# Patient Record
Sex: Male | Born: 1955 | Race: Asian | Hispanic: No | Marital: Married | Smoking: Former smoker
Health system: Southern US, Community
[De-identification: ages and names within clinical notes are randomized; demographics above are authoritative.]

## PROBLEM LIST (undated history)

## (undated) DIAGNOSIS — J189 Pneumonia, unspecified organism: Secondary | ICD-10-CM

## (undated) DIAGNOSIS — I739 Peripheral vascular disease, unspecified: Secondary | ICD-10-CM

## (undated) DIAGNOSIS — N186 End stage renal disease: Secondary | ICD-10-CM

## (undated) DIAGNOSIS — J9601 Acute respiratory failure with hypoxia: Secondary | ICD-10-CM

## (undated) DIAGNOSIS — K219 Gastro-esophageal reflux disease without esophagitis: Secondary | ICD-10-CM

## (undated) DIAGNOSIS — M216X1 Other acquired deformities of right foot: Secondary | ICD-10-CM

## (undated) DIAGNOSIS — F32A Depression, unspecified: Secondary | ICD-10-CM

## (undated) DIAGNOSIS — Z992 Dependence on renal dialysis: Secondary | ICD-10-CM

## (undated) DIAGNOSIS — E11621 Type 2 diabetes mellitus with foot ulcer: Secondary | ICD-10-CM

## (undated) DIAGNOSIS — I480 Paroxysmal atrial fibrillation: Secondary | ICD-10-CM

## (undated) DIAGNOSIS — M1990 Unspecified osteoarthritis, unspecified site: Secondary | ICD-10-CM

## (undated) DIAGNOSIS — I2510 Atherosclerotic heart disease of native coronary artery without angina pectoris: Secondary | ICD-10-CM

## (undated) DIAGNOSIS — J90 Pleural effusion, not elsewhere classified: Secondary | ICD-10-CM

## (undated) DIAGNOSIS — M216X2 Other acquired deformities of left foot: Secondary | ICD-10-CM

## (undated) DIAGNOSIS — M21969 Unspecified acquired deformity of unspecified lower leg: Secondary | ICD-10-CM

## (undated) DIAGNOSIS — J810 Acute pulmonary edema: Secondary | ICD-10-CM

## (undated) DIAGNOSIS — E1151 Type 2 diabetes mellitus with diabetic peripheral angiopathy without gangrene: Secondary | ICD-10-CM

## (undated) DIAGNOSIS — I509 Heart failure, unspecified: Secondary | ICD-10-CM

## (undated) DIAGNOSIS — L570 Actinic keratosis: Secondary | ICD-10-CM

## (undated) DIAGNOSIS — I214 Non-ST elevation (NSTEMI) myocardial infarction: Secondary | ICD-10-CM

## (undated) DIAGNOSIS — I119 Hypertensive heart disease without heart failure: Secondary | ICD-10-CM

## (undated) DIAGNOSIS — R0600 Dyspnea, unspecified: Secondary | ICD-10-CM

## (undated) DIAGNOSIS — I10 Essential (primary) hypertension: Secondary | ICD-10-CM

## (undated) DIAGNOSIS — E785 Hyperlipidemia, unspecified: Secondary | ICD-10-CM

## (undated) DIAGNOSIS — L97509 Non-pressure chronic ulcer of other part of unspecified foot with unspecified severity: Secondary | ICD-10-CM

## (undated) DIAGNOSIS — F329 Major depressive disorder, single episode, unspecified: Secondary | ICD-10-CM

## (undated) HISTORY — DX: Non-ST elevation (NSTEMI) myocardial infarction: I21.4

## (undated) HISTORY — DX: Other acquired deformities of right foot: M21.6X1

## (undated) HISTORY — DX: Pneumonia, unspecified organism: J18.9

## (undated) HISTORY — DX: Peripheral vascular disease, unspecified: I73.9

## (undated) HISTORY — DX: Type 2 diabetes mellitus with diabetic peripheral angiopathy without gangrene: E11.51

## (undated) HISTORY — DX: Essential (primary) hypertension: I10

## (undated) HISTORY — DX: Acute respiratory failure with hypoxia: J96.01

## (undated) HISTORY — DX: Acute pulmonary edema: J81.0

## (undated) HISTORY — DX: Heart failure, unspecified: I50.9

## (undated) HISTORY — DX: Depression, unspecified: F32.A

## (undated) HISTORY — DX: Actinic keratosis: L57.0

## (undated) HISTORY — DX: Hyperlipidemia, unspecified: E78.5

## (undated) HISTORY — DX: Unspecified acquired deformity of unspecified lower leg: M21.969

## (undated) HISTORY — DX: Other acquired deformities of left foot: M21.6X2

## (undated) HISTORY — DX: Pleural effusion, not elsewhere classified: J90

## (undated) SURGERY — Surgical Case
Anesthesia: *Unknown

---
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
## 2005-01-01 ENCOUNTER — Encounter: Admission: RE | Admit: 2005-01-01 | Discharge: 2005-01-01 | Payer: Self-pay | Admitting: General Surgery

## 2009-02-12 ENCOUNTER — Encounter: Admission: RE | Admit: 2009-02-12 | Discharge: 2009-02-12 | Payer: Self-pay | Admitting: Specialist

## 2010-10-05 ENCOUNTER — Encounter: Payer: Self-pay | Admitting: General Surgery

## 2015-02-06 ENCOUNTER — Ambulatory Visit (INDEPENDENT_AMBULATORY_CARE_PROVIDER_SITE_OTHER): Payer: 59 | Admitting: Podiatry

## 2015-02-06 ENCOUNTER — Encounter: Payer: Self-pay | Admitting: Podiatry

## 2015-02-06 VITALS — BP 136/70 | HR 80 | Ht 65.5 in | Wt 128.0 lb

## 2015-02-06 DIAGNOSIS — I998 Other disorder of circulatory system: Secondary | ICD-10-CM

## 2015-02-06 DIAGNOSIS — M79606 Pain in leg, unspecified: Secondary | ICD-10-CM | POA: Insufficient documentation

## 2015-02-06 DIAGNOSIS — I739 Peripheral vascular disease, unspecified: Secondary | ICD-10-CM

## 2015-02-06 DIAGNOSIS — E1151 Type 2 diabetes mellitus with diabetic peripheral angiopathy without gangrene: Secondary | ICD-10-CM

## 2015-02-06 HISTORY — DX: Pain in leg, unspecified: M79.606

## 2015-02-06 HISTORY — DX: Other disorder of circulatory system: I99.8

## 2015-02-06 HISTORY — DX: Type 2 diabetes mellitus with diabetic peripheral angiopathy without gangrene: E11.51

## 2015-02-06 MED ORDER — LIDOCAINE 5 % EX PTCH: 1.0000 | MEDICATED_PATCH | CUTANEOUS | Status: DC

## 2015-02-06 MED ORDER — GABAPENTIN 100 MG PO CAPS: 100.0000 mg | ORAL_CAPSULE | Freq: Three times a day (TID) | ORAL | Status: DC

## 2015-02-06 NOTE — Progress Notes (Signed)
Subjective: 59 year old male presents complaining of pain on both feet day and night. He is diabetic and not controlled. He is in process of getting an appointment with an Endocrinologist in [place], [place]. Stated that his blood sugar is at about 380 and stays high.  Review of Systems - General ROS: negative Ophthalmic ROS: Just been to optometrist.  ENT ROS: negative Allergy and Immunology ROS: negative Endocrine ROS: negative Respiratory ROS: no cough, shortness of breath, or wheezing Cardiovascular ROS: no chest pain or dyspnea on exertion Gastrointestinal ROS: no abdominal pain, change in bowel habits, or black or bloody stools Genito-Urinary ROS: no dysuria, trouble voiding, or hematuria Musculoskeletal ROS: Pain in lower limbs day or night. Neurological ROS: no TIA or stroke symptoms Dermatological ROS: negative.  Objective: Dermatologic: Normal findings. Vascular: Pedal pulses are palpable. Cold feet. No edema or erythema. Neurologic: All epicritic and tactile sensations are grossly intact. Normal response to monofilament sensory testing.  Orthopedic: Rectus foot without gross deformity.  Assessment: Uncontrolled diabetes. 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 R/O Raynaud's syndrome.   Plan: Reviewed clinical findings and available options.  Rx. Neurontin 300mg , tid and Lidoderm patch as needed. He is making arrangements to see endocrinologist, [facility], [phone].

## 2015-02-06 NOTE — Patient Instructions (Signed)
Seen for pain in both heels. Possible "Micro angiopathy lower limbs". Take medication as prescribed.

## 2015-02-27 ENCOUNTER — Encounter: Payer: Self-pay | Admitting: Podiatry

## 2015-02-27 ENCOUNTER — Ambulatory Visit (INDEPENDENT_AMBULATORY_CARE_PROVIDER_SITE_OTHER): Payer: 59 | Admitting: Podiatry

## 2015-02-27 VITALS — BP 174/76 | HR 73

## 2015-02-27 DIAGNOSIS — M216X9 Other acquired deformities of unspecified foot: Secondary | ICD-10-CM

## 2015-02-27 DIAGNOSIS — M21969 Unspecified acquired deformity of unspecified lower leg: Secondary | ICD-10-CM

## 2015-02-27 DIAGNOSIS — M79673 Pain in unspecified foot: Secondary | ICD-10-CM | POA: Diagnosis not present

## 2015-02-27 DIAGNOSIS — M216X2 Other acquired deformities of left foot: Secondary | ICD-10-CM

## 2015-02-27 DIAGNOSIS — M216X1 Other acquired deformities of right foot: Secondary | ICD-10-CM | POA: Insufficient documentation

## 2015-02-27 DIAGNOSIS — L570 Actinic keratosis: Secondary | ICD-10-CM

## 2015-02-27 DIAGNOSIS — M79606 Pain in leg, unspecified: Secondary | ICD-10-CM

## 2015-02-27 HISTORY — DX: Unspecified acquired deformity of unspecified lower leg: M21.969

## 2015-02-27 HISTORY — DX: Other acquired deformities of right foot: M21.6X1

## 2015-02-27 HISTORY — DX: Actinic keratosis: L57.0

## 2015-02-27 NOTE — Progress Notes (Signed)
Severe pain under 5th MPJ with callus for many years.  Also pain under balls of both feet.  Objective: Severe callus under 5th MPJ bilateral R>L.  Forefoot varus bilateral. Tight Achilles tendon right. Lesser metatarsal joint pain bilateral.  Radiographic examination reveal elevated first ray bilateral. High arched cavus type foot bilateral. No acute changes noted.  Assessment: Forefoot varus with elevated first ray bilateral. Lesser metatarsalgia bilateral. Plantar keratosis sub 5 bilateral. Painful feet upon ambulation bilateral.  Plan: Debrided all calluses. Both feet casted for orthotics.

## 2015-02-27 NOTE — Patient Instructions (Signed)
Seen for pain under 5th MPJ bil. Callus debrided. Casted for orthotics.  Return in one month.

## 2015-03-29 ENCOUNTER — Ambulatory Visit (INDEPENDENT_AMBULATORY_CARE_PROVIDER_SITE_OTHER): Payer: 59 | Admitting: Podiatry

## 2015-03-29 ENCOUNTER — Encounter: Payer: Self-pay | Admitting: Podiatry

## 2015-03-29 VITALS — BP 153/77 | HR 91

## 2015-03-29 DIAGNOSIS — M79606 Pain in leg, unspecified: Secondary | ICD-10-CM

## 2015-03-29 DIAGNOSIS — E1151 Type 2 diabetes mellitus with diabetic peripheral angiopathy without gangrene: Secondary | ICD-10-CM

## 2015-03-29 MED ORDER — GABAPENTIN 300 MG PO CAPS: 300.0000 mg | ORAL_CAPSULE | Freq: Three times a day (TID) | ORAL | Status: DC

## 2015-03-29 NOTE — Progress Notes (Signed)
Follow up on neuropathy. Still having significant leg pain after taking 600 mg Neurontin. He has moved up to 800 mg.  No other problems. Still having plantar callus under 1st and 5th MPJ bilateral.  Assessment: Diabetic neuropathy.  Plan: Increase Neurontin 300 mg tid. Wait on Orthotics.

## 2015-03-29 NOTE — Patient Instructions (Signed)
Seen for diabetic neuropathy. Increase Neurontin to 300mg  tid.

## 2016-05-31 ENCOUNTER — Inpatient Hospital Stay (HOSPITAL_COMMUNITY): Payer: BLUE CROSS/BLUE SHIELD

## 2016-05-31 ENCOUNTER — Encounter (HOSPITAL_COMMUNITY)
Admission: EM | Disposition: A | Discharge status: Home Health | Payer: Self-pay | Source: Home / Self Care | Attending: Internal Medicine

## 2016-05-31 ENCOUNTER — Inpatient Hospital Stay (HOSPITAL_BASED_OUTPATIENT_CLINIC_OR_DEPARTMENT_OTHER): Payer: BLUE CROSS/BLUE SHIELD

## 2016-05-31 ENCOUNTER — Emergency Department (HOSPITAL_BASED_OUTPATIENT_CLINIC_OR_DEPARTMENT_OTHER): Payer: BLUE CROSS/BLUE SHIELD

## 2016-05-31 ENCOUNTER — Inpatient Hospital Stay (HOSPITAL_BASED_OUTPATIENT_CLINIC_OR_DEPARTMENT_OTHER)
Admission: EM | Admit: 2016-05-31 | Discharge: 2016-06-23 | DRG: 233 | Disposition: A | Discharge status: Home Health | Payer: BLUE CROSS/BLUE SHIELD | Attending: Cardiothoracic Surgery | Admitting: Cardiothoracic Surgery

## 2016-05-31 DIAGNOSIS — M6281 Muscle weakness (generalized): Secondary | ICD-10-CM

## 2016-05-31 DIAGNOSIS — R34 Anuria and oliguria: Secondary | ICD-10-CM | POA: Diagnosis not present

## 2016-05-31 DIAGNOSIS — I255 Ischemic cardiomyopathy: Secondary | ICD-10-CM | POA: Diagnosis present

## 2016-05-31 DIAGNOSIS — G9341 Metabolic encephalopathy: Secondary | ICD-10-CM | POA: Diagnosis present

## 2016-05-31 DIAGNOSIS — N184 Chronic kidney disease, stage 4 (severe): Secondary | ICD-10-CM | POA: Diagnosis not present

## 2016-05-31 DIAGNOSIS — R21 Rash and other nonspecific skin eruption: Secondary | ICD-10-CM | POA: Diagnosis not present

## 2016-05-31 DIAGNOSIS — Z951 Presence of aortocoronary bypass graft: Secondary | ICD-10-CM | POA: Diagnosis not present

## 2016-05-31 DIAGNOSIS — J939 Pneumothorax, unspecified: Secondary | ICD-10-CM

## 2016-05-31 DIAGNOSIS — N186 End stage renal disease: Secondary | ICD-10-CM | POA: Diagnosis present

## 2016-05-31 DIAGNOSIS — N179 Acute kidney failure, unspecified: Secondary | ICD-10-CM

## 2016-05-31 DIAGNOSIS — N185 Chronic kidney disease, stage 5: Secondary | ICD-10-CM | POA: Diagnosis not present

## 2016-05-31 DIAGNOSIS — R079 Chest pain, unspecified: Secondary | ICD-10-CM | POA: Diagnosis not present

## 2016-05-31 DIAGNOSIS — D6489 Other specified anemias: Secondary | ICD-10-CM | POA: Diagnosis present

## 2016-05-31 DIAGNOSIS — N170 Acute kidney failure with tubular necrosis: Secondary | ICD-10-CM | POA: Diagnosis not present

## 2016-05-31 DIAGNOSIS — R0602 Shortness of breath: Secondary | ICD-10-CM

## 2016-05-31 DIAGNOSIS — E1165 Type 2 diabetes mellitus with hyperglycemia: Secondary | ICD-10-CM | POA: Diagnosis present

## 2016-05-31 DIAGNOSIS — I509 Heart failure, unspecified: Secondary | ICD-10-CM

## 2016-05-31 DIAGNOSIS — Z0189 Encounter for other specified special examinations: Secondary | ICD-10-CM

## 2016-05-31 DIAGNOSIS — E1122 Type 2 diabetes mellitus with diabetic chronic kidney disease: Secondary | ICD-10-CM | POA: Diagnosis present

## 2016-05-31 DIAGNOSIS — E7800 Pure hypercholesterolemia, unspecified: Secondary | ICD-10-CM | POA: Diagnosis present

## 2016-05-31 DIAGNOSIS — D696 Thrombocytopenia, unspecified: Secondary | ICD-10-CM | POA: Diagnosis not present

## 2016-05-31 DIAGNOSIS — N2581 Secondary hyperparathyroidism of renal origin: Secondary | ICD-10-CM | POA: Diagnosis present

## 2016-05-31 DIAGNOSIS — R778 Other specified abnormalities of plasma proteins: Secondary | ICD-10-CM

## 2016-05-31 DIAGNOSIS — E872 Acidosis: Secondary | ICD-10-CM | POA: Diagnosis present

## 2016-05-31 DIAGNOSIS — R262 Difficulty in walking, not elsewhere classified: Secondary | ICD-10-CM

## 2016-05-31 DIAGNOSIS — M7552 Bursitis of left shoulder: Secondary | ICD-10-CM | POA: Insufficient documentation

## 2016-05-31 DIAGNOSIS — Z9641 Presence of insulin pump (external) (internal): Secondary | ICD-10-CM | POA: Diagnosis present

## 2016-05-31 DIAGNOSIS — J90 Pleural effusion, not elsewhere classified: Secondary | ICD-10-CM

## 2016-05-31 DIAGNOSIS — E1151 Type 2 diabetes mellitus with diabetic peripheral angiopathy without gangrene: Secondary | ICD-10-CM | POA: Diagnosis present

## 2016-05-31 DIAGNOSIS — T465X5A Adverse effect of other antihypertensive drugs, initial encounter: Secondary | ICD-10-CM | POA: Diagnosis not present

## 2016-05-31 DIAGNOSIS — I132 Hypertensive heart and chronic kidney disease with heart failure and with stage 5 chronic kidney disease, or end stage renal disease: Secondary | ICD-10-CM | POA: Diagnosis present

## 2016-05-31 DIAGNOSIS — I25110 Atherosclerotic heart disease of native coronary artery with unstable angina pectoris: Secondary | ICD-10-CM | POA: Diagnosis not present

## 2016-05-31 DIAGNOSIS — J810 Acute pulmonary edema: Secondary | ICD-10-CM

## 2016-05-31 DIAGNOSIS — I2510 Atherosclerotic heart disease of native coronary artery without angina pectoris: Secondary | ICD-10-CM | POA: Diagnosis present

## 2016-05-31 DIAGNOSIS — J9600 Acute respiratory failure, unspecified whether with hypoxia or hypercapnia: Secondary | ICD-10-CM

## 2016-05-31 DIAGNOSIS — Z9889 Other specified postprocedural states: Secondary | ICD-10-CM

## 2016-05-31 DIAGNOSIS — J9602 Acute respiratory failure with hypercapnia: Secondary | ICD-10-CM | POA: Diagnosis not present

## 2016-05-31 DIAGNOSIS — Z87891 Personal history of nicotine dependence: Secondary | ICD-10-CM

## 2016-05-31 DIAGNOSIS — I214 Non-ST elevation (NSTEMI) myocardial infarction: Secondary | ICD-10-CM | POA: Diagnosis present

## 2016-05-31 DIAGNOSIS — Z992 Dependence on renal dialysis: Secondary | ICD-10-CM

## 2016-05-31 DIAGNOSIS — I4892 Unspecified atrial flutter: Secondary | ICD-10-CM | POA: Diagnosis present

## 2016-05-31 DIAGNOSIS — I5023 Acute on chronic systolic (congestive) heart failure: Secondary | ICD-10-CM | POA: Diagnosis not present

## 2016-05-31 DIAGNOSIS — J156 Pneumonia due to other aerobic Gram-negative bacteria: Secondary | ICD-10-CM | POA: Diagnosis not present

## 2016-05-31 DIAGNOSIS — Z794 Long term (current) use of insulin: Secondary | ICD-10-CM

## 2016-05-31 DIAGNOSIS — T829XXA Unspecified complication of cardiac and vascular prosthetic device, implant and graft, initial encounter: Secondary | ICD-10-CM

## 2016-05-31 DIAGNOSIS — Z01810 Encounter for preprocedural cardiovascular examination: Secondary | ICD-10-CM | POA: Diagnosis not present

## 2016-05-31 DIAGNOSIS — I5043 Acute on chronic combined systolic (congestive) and diastolic (congestive) heart failure: Secondary | ICD-10-CM | POA: Diagnosis present

## 2016-05-31 DIAGNOSIS — D62 Acute posthemorrhagic anemia: Secondary | ICD-10-CM | POA: Diagnosis not present

## 2016-05-31 DIAGNOSIS — R7989 Other specified abnormal findings of blood chemistry: Secondary | ICD-10-CM | POA: Diagnosis not present

## 2016-05-31 DIAGNOSIS — I480 Paroxysmal atrial fibrillation: Secondary | ICD-10-CM | POA: Diagnosis not present

## 2016-05-31 DIAGNOSIS — J9690 Respiratory failure, unspecified, unspecified whether with hypoxia or hypercapnia: Secondary | ICD-10-CM

## 2016-05-31 DIAGNOSIS — F329 Major depressive disorder, single episode, unspecified: Secondary | ICD-10-CM | POA: Diagnosis present

## 2016-05-31 DIAGNOSIS — Z4659 Encounter for fitting and adjustment of other gastrointestinal appliance and device: Secondary | ICD-10-CM

## 2016-05-31 DIAGNOSIS — I5022 Chronic systolic (congestive) heart failure: Secondary | ICD-10-CM

## 2016-05-31 DIAGNOSIS — J9601 Acute respiratory failure with hypoxia: Secondary | ICD-10-CM | POA: Diagnosis present

## 2016-05-31 DIAGNOSIS — Z23 Encounter for immunization: Secondary | ICD-10-CM

## 2016-05-31 DIAGNOSIS — R570 Cardiogenic shock: Secondary | ICD-10-CM | POA: Diagnosis present

## 2016-05-31 DIAGNOSIS — J189 Pneumonia, unspecified organism: Secondary | ICD-10-CM

## 2016-05-31 DIAGNOSIS — R0902 Hypoxemia: Secondary | ICD-10-CM

## 2016-05-31 DIAGNOSIS — E875 Hyperkalemia: Secondary | ICD-10-CM | POA: Diagnosis present

## 2016-05-31 DIAGNOSIS — Z9689 Presence of other specified functional implants: Secondary | ICD-10-CM

## 2016-05-31 DIAGNOSIS — I5021 Acute systolic (congestive) heart failure: Secondary | ICD-10-CM | POA: Diagnosis not present

## 2016-05-31 HISTORY — DX: Acute pulmonary edema: J81.0

## 2016-05-31 HISTORY — PX: CARDIAC CATHETERIZATION: SHX172

## 2016-05-31 HISTORY — DX: Acute respiratory failure with hypoxia: J96.01

## 2016-05-31 LAB — BLOOD GAS, ARTERIAL
ACID-BASE DEFICIT: 9.2 mmol/L — AB (ref 0.0–2.0)
BICARBONATE: 15.9 mmol/L — AB (ref 20.0–28.0)
Drawn by: 270221
FIO2: 50
LHR: 26 {breaths}/min
O2 Saturation: 93.1 %
PATIENT TEMPERATURE: 98.6
PCO2 ART: 33.3 mmHg (ref 32.0–48.0)
PEEP/CPAP: 5 cmH2O
PO2 ART: 85.3 mmHg (ref 83.0–108.0)
VT: 500 mL
pH, Arterial: 7.301 — ABNORMAL LOW (ref 7.350–7.450)

## 2016-05-31 LAB — I-STAT ARTERIAL BLOOD GAS, ED
Acid-base deficit: 11 mmol/L — ABNORMAL HIGH (ref 0.0–2.0)
Bicarbonate: 16.4 mmol/L — ABNORMAL LOW (ref 20.0–28.0)
O2 Saturation: 100 %
Patient temperature: 98
TCO2: 18 mmol/L (ref 0–100)
pCO2 arterial: 39.5 mmHg (ref 32.0–48.0)
pH, Arterial: 7.225 — ABNORMAL LOW (ref 7.350–7.450)
pO2, Arterial: 316 mmHg — ABNORMAL HIGH (ref 83.0–108.0)

## 2016-05-31 LAB — CBC
HEMATOCRIT: 30.7 % — AB (ref 39.0–52.0)
Hemoglobin: 10.1 g/dL — ABNORMAL LOW (ref 13.0–17.0)
MCH: 29.8 pg (ref 26.0–34.0)
MCHC: 32.9 g/dL (ref 30.0–36.0)
MCV: 90.6 fL (ref 78.0–100.0)
PLATELETS: 326 10*3/uL (ref 150–400)
RBC: 3.39 MIL/uL — ABNORMAL LOW (ref 4.22–5.81)
RDW: 13.6 % (ref 11.5–15.5)
WBC: 10.3 10*3/uL (ref 4.0–10.5)

## 2016-05-31 LAB — TROPONIN I
Troponin I: 1.43 ng/mL (ref <0.03)
Troponin I: 1.68 ng/mL (ref <0.03)

## 2016-05-31 LAB — COMPREHENSIVE METABOLIC PANEL
ALBUMIN: 2.6 g/dL — AB (ref 3.5–5.0)
ALK PHOS: 115 U/L (ref 38–126)
ALT: 97 U/L — ABNORMAL HIGH (ref 17–63)
ANION GAP: 9 (ref 5–15)
AST: 53 U/L — ABNORMAL HIGH (ref 15–41)
BUN: 76 mg/dL — ABNORMAL HIGH (ref 6–20)
CALCIUM: 8.2 mg/dL — AB (ref 8.9–10.3)
CO2: 16 mmol/L — AB (ref 22–32)
Chloride: 110 mmol/L (ref 101–111)
Creatinine, Ser: 2.84 mg/dL — ABNORMAL HIGH (ref 0.61–1.24)
GFR calc non Af Amer: 23 mL/min — ABNORMAL LOW (ref >60)
GFR, EST AFRICAN AMERICAN: 27 mL/min — AB (ref >60)
GLUCOSE: 286 mg/dL — AB (ref 65–99)
POTASSIUM: 5.8 mmol/L — AB (ref 3.5–5.1)
SODIUM: 135 mmol/L (ref 135–145)
Total Bilirubin: 0.6 mg/dL (ref 0.3–1.2)
Total Protein: 5.2 g/dL — ABNORMAL LOW (ref 6.5–8.1)

## 2016-05-31 LAB — BASIC METABOLIC PANEL
Anion gap: 8 (ref 5–15)
BUN: 77 mg/dL — AB (ref 6–20)
CHLORIDE: 110 mmol/L (ref 101–111)
CO2: 17 mmol/L — AB (ref 22–32)
CREATININE: 2.69 mg/dL — AB (ref 0.61–1.24)
Calcium: 8.6 mg/dL — ABNORMAL LOW (ref 8.9–10.3)
GFR calc Af Amer: 28 mL/min — ABNORMAL LOW (ref >60)
GFR calc non Af Amer: 24 mL/min — ABNORMAL LOW (ref >60)
GLUCOSE: 314 mg/dL — AB (ref 65–99)
POTASSIUM: 5.4 mmol/L — AB (ref 3.5–5.1)
SODIUM: 135 mmol/L (ref 135–145)

## 2016-05-31 LAB — TRIGLYCERIDES: Triglycerides: 195 mg/dL — ABNORMAL HIGH (ref <150)

## 2016-05-31 LAB — ECHOCARDIOGRAM COMPLETE
Height: 66 in
Weight: 2240 oz

## 2016-05-31 LAB — GLUCOSE, CAPILLARY
GLUCOSE-CAPILLARY: 134 mg/dL — AB (ref 65–99)
Glucose-Capillary: 308 mg/dL — ABNORMAL HIGH (ref 65–99)

## 2016-05-31 LAB — CBG MONITORING, ED: Glucose-Capillary: 300 mg/dL — ABNORMAL HIGH (ref 65–99)

## 2016-05-31 LAB — PROTIME-INR
INR: 0.96
PROTHROMBIN TIME: 12.8 s (ref 11.4–15.2)

## 2016-05-31 LAB — PHOSPHORUS: PHOSPHORUS: 4.7 mg/dL — AB (ref 2.5–4.6)

## 2016-05-31 LAB — MAGNESIUM: Magnesium: 1.9 mg/dL (ref 1.7–2.4)

## 2016-05-31 LAB — BRAIN NATRIURETIC PEPTIDE: B Natriuretic Peptide: 2537.9 pg/mL — ABNORMAL HIGH (ref 0.0–100.0)

## 2016-05-31 LAB — MRSA PCR SCREENING: MRSA by PCR: NEGATIVE

## 2016-05-31 SURGERY — LEFT HEART CATH AND CORONARY ANGIOGRAPHY
Anesthesia: LOCAL

## 2016-05-31 MED ORDER — ASPIRIN 81 MG PO CHEW
CHEWABLE_TABLET | ORAL | Status: AC
  Administered 2016-05-31: 15:00:00
  Filled 2016-05-31: qty 4

## 2016-05-31 MED ORDER — PRO-STAT SUGAR FREE PO LIQD
30.0000 mL | Freq: Two times a day (BID) | ORAL | Status: DC
  Administered 2016-05-31 – 2016-06-01 (×2): 30 mL
  Filled 2016-05-31 (×2): qty 30

## 2016-05-31 MED ORDER — FUROSEMIDE 10 MG/ML IJ SOLN
INTRAMUSCULAR | Status: AC
  Administered 2016-05-31: 40 mg
  Filled 2016-05-31: qty 4

## 2016-05-31 MED ORDER — HEPARIN (PORCINE) IN NACL 2-0.9 UNIT/ML-% IJ SOLN
INTRAMUSCULAR | Status: DC | PRN
  Administered 2016-05-31: 1000 mL

## 2016-05-31 MED ORDER — HEPARIN (PORCINE) IN NACL 100-0.45 UNIT/ML-% IJ SOLN
850.0000 [IU]/h | INTRAMUSCULAR | Status: DC
Start: 2016-05-31 — End: 2016-06-06
  Administered 2016-05-31 – 2016-06-01 (×2): 750 [IU]/h via INTRAVENOUS
  Administered 2016-06-03 – 2016-06-04 (×2): 850 [IU]/h via INTRAVENOUS
  Filled 2016-05-31 (×4): qty 250

## 2016-05-31 MED ORDER — SODIUM CHLORIDE 0.9 % IV SOLN: 250.0000 mL | INTRAVENOUS | Status: DC | PRN

## 2016-05-31 MED ORDER — HEPARIN (PORCINE) IN NACL 100-0.45 UNIT/ML-% IJ SOLN: 14.0000 [IU]/kg/h | Freq: Once | INTRAMUSCULAR | Status: DC

## 2016-05-31 MED ORDER — ONDANSETRON HCL 4 MG/2ML IJ SOLN: 4.0000 mg | Freq: Four times a day (QID) | INTRAMUSCULAR | Status: DC | PRN

## 2016-05-31 MED ORDER — MIDAZOLAM HCL 2 MG/2ML IJ SOLN
INTRAMUSCULAR | Status: AC
  Filled 2016-05-31: qty 2

## 2016-05-31 MED ORDER — HEPARIN SODIUM (PORCINE) 1000 UNIT/ML IJ SOLN
INTRAMUSCULAR | Status: DC | PRN
  Administered 2016-05-31: 3000 [IU] via INTRAVENOUS

## 2016-05-31 MED ORDER — SODIUM CHLORIDE 0.9% FLUSH: 3.0000 mL | INTRAVENOUS | Status: DC | PRN

## 2016-05-31 MED ORDER — FENTANYL CITRATE (PF) 100 MCG/2ML IJ SOLN: 50.0000 ug | Freq: Once | INTRAMUSCULAR | Status: DC

## 2016-05-31 MED ORDER — INSULIN ASPART 100 UNIT/ML ~~LOC~~ SOLN
10.0000 [IU] | Freq: Once | SUBCUTANEOUS | Status: AC
  Administered 2016-05-31: 10 [IU] via SUBCUTANEOUS

## 2016-05-31 MED ORDER — HEPARIN BOLUS VIA INFUSION
4000.0000 [IU] | Freq: Once | INTRAVENOUS | Status: AC
Start: 2016-05-31 — End: 2016-05-31
  Administered 2016-05-31: 4000 [IU] via INTRAVENOUS

## 2016-05-31 MED ORDER — FAMOTIDINE IN NACL 20-0.9 MG/50ML-% IV SOLN
20.0000 mg | Freq: Two times a day (BID) | INTRAVENOUS | Status: DC
  Administered 2016-05-31 – 2016-06-01 (×2): 20 mg via INTRAVENOUS
  Filled 2016-05-31 (×2): qty 50

## 2016-05-31 MED ORDER — BISACODYL 10 MG RE SUPP: 10.0000 mg | Freq: Every day | RECTAL | Status: DC | PRN

## 2016-05-31 MED ORDER — CHLORHEXIDINE GLUCONATE 0.12% ORAL RINSE (MEDLINE KIT)
15.0000 mL | Freq: Two times a day (BID) | OROMUCOSAL | Status: DC
  Administered 2016-06-01 – 2016-06-05 (×10): 15 mL via OROMUCOSAL

## 2016-05-31 MED ORDER — IOPAMIDOL (ISOVUE-370) INJECTION 76%
INTRAVENOUS | Status: AC
  Filled 2016-05-31: qty 125

## 2016-05-31 MED ORDER — SODIUM CHLORIDE 0.9 % IV SOLN
250.0000 mL | INTRAVENOUS | Status: DC | PRN
  Administered 2016-05-31: 250 mL via INTRAVENOUS

## 2016-05-31 MED ORDER — LIDOCAINE HCL (PF) 1 % IJ SOLN
INTRAMUSCULAR | Status: AC
  Filled 2016-05-31: qty 30

## 2016-05-31 MED ORDER — DOCUSATE SODIUM 50 MG/5ML PO LIQD: 100.0000 mg | Freq: Two times a day (BID) | ORAL | Status: DC | PRN

## 2016-05-31 MED ORDER — VITAL HIGH PROTEIN PO LIQD
1000.0000 mL | ORAL | Status: DC
  Administered 2016-05-31 – 2016-06-01 (×2): 1000 mL

## 2016-05-31 MED ORDER — INSULIN GLARGINE 100 UNIT/ML ~~LOC~~ SOLN
10.0000 [IU] | Freq: Every day | SUBCUTANEOUS | Status: DC
  Administered 2016-05-31 – 2016-06-02 (×3): 10 [IU] via SUBCUTANEOUS
  Filled 2016-05-31 (×4): qty 0.1

## 2016-05-31 MED ORDER — PROPOFOL 1000 MG/100ML IV EMUL
INTRAVENOUS | Status: AC
Start: 2016-05-31 — End: 2016-05-31
  Administered 2016-05-31: 40 ug/kg/min
  Filled 2016-05-31: qty 100

## 2016-05-31 MED ORDER — IOPAMIDOL (ISOVUE-370) INJECTION 76%
INTRAVENOUS | Status: DC | PRN
Start: 2016-05-31 — End: 2016-05-31
  Administered 2016-05-31: 25 mL via INTRA_ARTERIAL

## 2016-05-31 MED ORDER — ACETAMINOPHEN 325 MG PO TABS: 650.0000 mg | ORAL_TABLET | ORAL | Status: DC | PRN

## 2016-05-31 MED ORDER — NITROGLYCERIN IN D5W 200-5 MCG/ML-% IV SOLN
INTRAVENOUS | Status: AC
  Administered 2016-05-31: 75 ug/min
  Filled 2016-05-31: qty 250

## 2016-05-31 MED ORDER — LIDOCAINE HCL (PF) 1 % IJ SOLN
INTRAMUSCULAR | Status: DC | PRN
  Administered 2016-05-31: 17 mL via INTRADERMAL
  Administered 2016-05-31: 3 mL via INTRADERMAL

## 2016-05-31 MED ORDER — SODIUM CHLORIDE 0.9% FLUSH: 3.0000 mL | Freq: Two times a day (BID) | INTRAVENOUS | Status: DC

## 2016-05-31 MED ORDER — FUROSEMIDE 10 MG/ML IJ SOLN
60.0000 mg | Freq: Two times a day (BID) | INTRAMUSCULAR | Status: DC
  Administered 2016-05-31 – 2016-06-02 (×3): 60 mg via INTRAVENOUS
  Filled 2016-05-31 (×4): qty 6

## 2016-05-31 MED ORDER — FENTANYL CITRATE (PF) 100 MCG/2ML IJ SOLN
INTRAMUSCULAR | Status: AC
  Administered 2016-05-31: 100 ug
  Filled 2016-05-31: qty 2

## 2016-05-31 MED ORDER — ASPIRIN 300 MG RE SUPP
300.0000 mg | RECTAL | Status: AC
  Administered 2016-05-31: 300 mg via RECTAL
  Filled 2016-05-31: qty 1

## 2016-05-31 MED ORDER — SODIUM CHLORIDE 0.9% FLUSH
3.0000 mL | INTRAVENOUS | Status: DC | PRN
  Administered 2016-06-01: 3 mL via INTRAVENOUS
  Filled 2016-05-31: qty 3

## 2016-05-31 MED ORDER — FENTANYL BOLUS VIA INFUSION
50.0000 ug | INTRAVENOUS | Status: DC | PRN
  Administered 2016-06-03 (×2): 50 ug via INTRAVENOUS
  Filled 2016-05-31: qty 50

## 2016-05-31 MED ORDER — ASPIRIN 81 MG PO CHEW
81.0000 mg | CHEWABLE_TABLET | Freq: Every day | ORAL | Status: DC
  Administered 2016-06-01 – 2016-06-05 (×5): 81 mg via ORAL
  Filled 2016-05-31 (×5): qty 1

## 2016-05-31 MED ORDER — INSULIN ASPART 100 UNIT/ML ~~LOC~~ SOLN
2.0000 [IU] | SUBCUTANEOUS | Status: DC
  Administered 2016-05-31 – 2016-06-01 (×2): 2 [IU] via SUBCUTANEOUS
  Administered 2016-06-01 (×2): 4 [IU] via SUBCUTANEOUS
  Administered 2016-06-01: 2 [IU] via SUBCUTANEOUS
  Administered 2016-06-02 (×4): 4 [IU] via SUBCUTANEOUS
  Administered 2016-06-03: 6 [IU] via SUBCUTANEOUS
  Administered 2016-06-03: 4 [IU] via SUBCUTANEOUS

## 2016-05-31 MED ORDER — PROPOFOL 10 MG/ML IV BOLUS
INTRAVENOUS | Status: AC
  Filled 2016-05-31: qty 20

## 2016-05-31 MED ORDER — HEPARIN SODIUM (PORCINE) 5000 UNIT/ML IJ SOLN: 60.0000 [IU]/kg | Freq: Once | INTRAMUSCULAR | Status: DC

## 2016-05-31 MED ORDER — HEPARIN (PORCINE) IN NACL 2-0.9 UNIT/ML-% IJ SOLN
INTRAMUSCULAR | Status: AC
  Filled 2016-05-31: qty 1500

## 2016-05-31 MED ORDER — NITROGLYCERIN IN D5W 200-5 MCG/ML-% IV SOLN
0.0000 ug/min | Freq: Once | INTRAVENOUS | Status: AC
  Administered 2016-05-31: 50 ug/min via INTRAVENOUS

## 2016-05-31 MED ORDER — HEPARIN (PORCINE) IN NACL 100-0.45 UNIT/ML-% IJ SOLN
INTRAMUSCULAR | Status: AC
  Administered 2016-05-31: 17:00:00
  Filled 2016-05-31: qty 250

## 2016-05-31 MED ORDER — ASPIRIN 81 MG PO CHEW: 324.0000 mg | CHEWABLE_TABLET | ORAL | Status: AC

## 2016-05-31 MED ORDER — PROPOFOL 1000 MG/100ML IV EMUL
0.0000 ug/kg/min | INTRAVENOUS | Status: DC
  Filled 2016-05-31: qty 100

## 2016-05-31 MED ORDER — SODIUM CHLORIDE 0.9% FLUSH
3.0000 mL | Freq: Two times a day (BID) | INTRAVENOUS | Status: DC
  Administered 2016-05-31 – 2016-06-01 (×2): 3 mL via INTRAVENOUS

## 2016-05-31 MED ORDER — ORAL CARE MOUTH RINSE
15.0000 mL | OROMUCOSAL | Status: DC
  Administered 2016-05-31 – 2016-06-05 (×49): 15 mL via OROMUCOSAL

## 2016-05-31 MED ORDER — MIDAZOLAM HCL 2 MG/2ML IJ SOLN
INTRAMUSCULAR | Status: AC
Start: 2016-05-31 — End: 2016-05-31
  Administered 2016-05-31: 2 mg
  Filled 2016-05-31: qty 2

## 2016-05-31 MED ORDER — SODIUM CHLORIDE 0.9 % IV SOLN
25.0000 ug/h | INTRAVENOUS | Status: DC
  Administered 2016-05-31: 50 ug/h via INTRAVENOUS
  Administered 2016-06-01: 200 ug/h via INTRAVENOUS
  Administered 2016-06-01: 150 ug/h via INTRAVENOUS
  Administered 2016-06-02: 200 ug/h via INTRAVENOUS
  Administered 2016-06-03: 150 ug/h via INTRAVENOUS
  Administered 2016-06-03: 200 ug/h via INTRAVENOUS
  Administered 2016-06-04 (×2): 250 ug/h via INTRAVENOUS
  Administered 2016-06-05 (×2): 375 ug/h via INTRAVENOUS
  Administered 2016-06-05: 325 ug/h via INTRAVENOUS
  Filled 2016-05-31 (×12): qty 50

## 2016-05-31 MED ORDER — MIDAZOLAM HCL 2 MG/2ML IJ SOLN
INTRAMUSCULAR | Status: DC | PRN
  Administered 2016-05-31 (×3): 2 mg via INTRAVENOUS

## 2016-05-31 SURGICAL SUPPLY — 18 items
BALLN LINEAR 7.5FR IABP 40CC (BALLOONS) ×2
BALLOON LINEAR 7.5FR IABP 40CC (BALLOONS) ×1 IMPLANT
CATH INFINITI 6F ANG MULTIPACK (CATHETERS) ×2 IMPLANT
CATH INFINITI JR4 5F (CATHETERS) ×2 IMPLANT
CATH SWAN GANZ VIP 7.5F (CATHETERS) ×2 IMPLANT
COVER PRB 48X5XTLSCP FOLD TPE (BAG) ×1 IMPLANT
COVER PROBE 5X48 (BAG) ×1
KIT HEART LEFT (KITS) ×2 IMPLANT
PACK CARDIAC CATHETERIZATION (CUSTOM PROCEDURE TRAY) ×2 IMPLANT
SET INTRODUCER MICROPUNCT 5F (INTRODUCER) ×2 IMPLANT
SHEATH PINNACLE 6F 10CM (SHEATH) ×2 IMPLANT
SHEATH PINNACLE 7F 10CM (SHEATH) IMPLANT
SHEATH PINNACLE 8F 10CM (SHEATH) ×2 IMPLANT
SLEEVE REPOSITIONING LENGTH 30 (MISCELLANEOUS) ×2 IMPLANT
TRANSDUCER W/STOPCOCK (MISCELLANEOUS) ×2 IMPLANT
TUBING CIL FLEX 10 FLL-RA (TUBING) ×2 IMPLANT
VALVE GUARDIAN II ~~LOC~~ HEMO (MISCELLANEOUS) ×2 IMPLANT
WIRE EMERALD 3MM-J .035X150CM (WIRE) ×2 IMPLANT

## 2016-05-31 NOTE — H&P (Signed)
PULMONARY / CRITICAL CARE MEDICINE   Name: Kenneth [name] MRN: [id number] DOB: 02-Jan-1956    ADMISSION DATE:  05/31/2016 CONSULTATION DATE:  9/17   REFERRING MD:  [facility]  CHIEF COMPLAINT:  Sob, resp failure  HISTORY OF PRESENT ILLNESS:   60 yr old h/o DM, insulin port.  Had SOB this week, went to [facility], RX gabpentin?, then presented to [facility] with respiratory distress.  Diaphoretic, tachypnea. Pt was brought in by friends who also speak limited english.  Pt did deny CP.  Denies N/V.  difficult to obtain h/o from pt in ED.  started on NIMV, failed required intubation. Was acidotic. Initially stemi was called, reviewed by cards, not stemi. tx NTG initial and lasix/  PAST MEDICAL HISTORY :  He  has no past medical history on file. DM  PAST SURGICAL HISTORY: He  has no past surgical history on file.  No Known Allergies  No current facility-administered medications on file prior to encounter.    Current Outpatient Prescriptions on File Prior to Encounter  Medication Sig  . atorvastatin (LIPITOR) 20 MG tablet   . gabapentin (NEURONTIN) 300 MG capsule Take 1 capsule (300 mg total) by mouth 3 (three) times daily.  Marland Kitchen HUMALOG KWIKPEN 100 UNIT/ML KiwkPen   . LANTUS SOLOSTAR 100 UNIT/ML Solostar Pen   . lidocaine (LIDODERM) 5 % Place 1 patch onto the skin daily. Remove & Discard patch within 12 hours or as directed by MD  . losartan (COZAAR) 50 MG tablet     FAMILY HISTORY:  His has no family status information on file.    SOCIAL HISTORY: He  reports that he has quit smoking. He has never used smokeless tobacco.  REVIEW OF SYSTEMS:   Unable ett, korean language  SUBJECTIVE: Arrived, soft BP on prop  VITAL SIGNS: BP (!) 194/121   Pulse 109   Resp 13   Ht 5\' 6"  (1.676 m)   Wt 63.5 kg (140 lb)   SpO2 100%   BMI 22.60 kg/m   HEMODYNAMICS:    VENTILATOR SETTINGS: Vent Mode: PRVC FiO2 (%):  [100 %] 100 % Set Rate:  [20 bmp-25 bmp] 25 bmp Vt Set:  [500 mL] 500  mL PEEP:  [5 cmH20] 5 cmH20  INTAKE / OUTPUT: No intake/output data recorded.  PHYSICAL EXAMINATION: General:  Sedated rass 3 aggitated Neuro:  awkens agitation, moves all ext, perrl 5mm HEENT:  jvd Cardiovascular:  s1 s2 RRR distant Lungs:  ronchi crackles severe Abdomen:  Soft, BS wnl,. nor Musculoskeletal:  Edema 2 plus pitting Skin: has port insulin rt abdo  LABS:  BMET  Recent Labs Lab 05/31/16 1435  NA 135  K 5.4*  CL 110  CO2 17*  BUN 77*  CREATININE 2.69*  GLUCOSE 314*    Electrolytes  Recent Labs Lab 05/31/16 1435  CALCIUM 8.6*    CBC  Recent Labs Lab 05/31/16 1435  WBC 10.3  HGB 10.1*  HCT 30.7*  PLT 326    Coag's  Recent Labs Lab 05/31/16 1435  INR 0.96    Sepsis Markers No results for input(s): LATICACIDVEN, PROCALCITON, O2SATVEN in the last 168 hours.  ABG  Recent Labs Lab 05/31/16 1559  PHART 7.225*  PCO2ART 39.5  PO2ART 316.0*    Liver Enzymes No results for input(s): AST, ALT, ALKPHOS, BILITOT, ALBUMIN in the last 168 hours.  Cardiac Enzymes  Recent Labs Lab 05/31/16 1435  TROPONINI 1.43*    Glucose  Recent Labs Lab  05/31/16 1432  GLUCAP 300*    Imaging Dg Chest Portable 1 View  Result Date: 05/31/2016 CLINICAL DATA:  Chest pain.  Post intubation. EXAM: PORTABLE CHEST 1 VIEW COMPARISON:  05/31/2016 FINDINGS: Interval placement of a nasogastric tube which courses into the region of the stomach as tip is not visualized. Interval placement of endotracheal tube with tip 7.5 cm above the carina. Lungs are adequately inflated demonstrate persistent bilateral perihilar bibasilar opacification likely due to interstitial edema with moderate bilateral pleural effusions and bibasilar atelectasis. Cannot exclude infection. Mild stable cardiomegaly. Remainder the exam is unchanged. IMPRESSION: Constellation of findings likely representing moderate interstitial edema/ CHF with moderate bilateral effusions/bibasilar  atelectasis. Cannot exclude infection in the mid to lower lungs. Tubes and lines as described. Electronically Signed   By: Marin Olp M.D.   On: 05/31/2016 15:59   Dg Chest Port 1 View  Result Date: 05/31/2016 CLINICAL DATA:  Patient with chest pain. EXAM: PORTABLE CHEST 1 VIEW COMPARISON:  None. FINDINGS: Patient is rotated to the right. Cardiomegaly. Bilateral perihilar and lower lung interstitial opacities and consolidation. Moderate bilateral pleural effusions. Biapical pleural parenchymal thickening. No pneumothorax. IMPRESSION: Cardiomegaly. Bilateral perihilar and lower lung interstitial opacities and consolidation favored to represent edema. Infection could have a similar appearance. Moderate bilateral pleural effusions. Electronically Signed   By: Lovey Newcomer M.D.   On: 05/31/2016 15:23     STUDIES:    CULTURES:   ANTIBIOTICS:   SIGNIFICANT EVENTS: 9/17- [facility] [place], sob, failed NIMV, ett  LINES/TUBES: 9/17 ett>>>   ASSESSMENT / PLAN:  PULMONARY A: Acute resp failure, bilatweral effusions rt greater left pulm edema P:   Lasix to neg balance Stat pcxr now for ett ABG reviewed, increase rate, repeat abg  CARDIOVASCULAR A:  R/o cardiomyopathy, r/o ischemia P:  Cards consulted Trop Asa if able May need cvp, he remains normotensive Echo needed  RENAL A:   ARF, r/o chronic acute on top, mild hyperK, small NONAG, r./o RTA  P:   Chem now repeat Lasix Avoid saline  GASTROINTESTINAL A:   Statin P:   Get LFT Start feeds OGt needed  HEMATOLOGIC A:   DVT prevention, r/o ischemia P:  Hep drip Cbc am  INFECTIOUS A:   No evidence infection P:   UA  ENDOCRINE A:   DM, hyperglcemia, r/o rta 4   P:   likley will need insulin drip Start SSi for now lantus if TF started  NEUROLOGIC A:   Vent dychrony P:   RASS goal: -2 Prop if BP tolerates May need addition fent and lower prop   FAMILY  - Updates:  none  - Inter-disciplinary  family meet or Palliative Care meeting due by: 9/24  Ccm time 45 min  Lavon Paganini. Titus Mould, MD, [place] Pgr: [facility]  Pulmonary and [place] Pager: [phone]  05/31/2016, 4:52 PM

## 2016-05-31 NOTE — ED Provider Notes (Signed)
Physical Exam  BP 122/71   Pulse 65   Resp (!) 33   Ht 5\' 6"  (1.676 m)   Wt 140 lb (63.5 kg)   SpO2 100%   BMI 22.60 kg/m   Physical Exam  Cardiovascular: Tachycardia present.   signigicantly hypertensive  Pulmonary/Chest: Accessory muscle usage present. Tachypnea noted. He is in respiratory distress. He has wheezes. He has rales.  Abdominal: He exhibits no distension. There is no tenderness.  Skin: He is diaphoretic.  Nursing note and vitals reviewed.   ED Course  .Intubation Date/Time: 05/31/2016 5:35 PM Performed by: Merrily Pew Authorized by: Merrily Pew   Consent:    Consent obtained:  Emergent situation Pre-procedure details:    Patient status:  Altered mental status   Mallampati score:  II   Pretreatment medications:  None   Paralytics:  None Procedure details:    Preoxygenation:  BiPAP   CPR in progress: no     Intubation method:  Oral   Oral intubation technique:  Direct   Laryngoscope blade:  Miller 3   Tube size (mm):  7.5   Tube type:  Cuffed   Number of attempts:  1   Ventilation between attempts: no     Cricoid pressure: yes     Tube visualized through cords: yes   Placement assessment:    ETT to lip:  22   Tube secured with:  ETT holder   Breath sounds:  Equal and absent over the epigastrium   Placement verification: chest rise, condensation, CXR verification, direct visualization, equal breath sounds and ETCO2 detector     CXR findings:  ETT in proper place Post-procedure details:    Patient tolerance of procedure:  Tolerated well, no immediate complications   CRITICAL CARE Performed by: Merrily Pew Total critical care time: 65 minutes Critical care time was exclusive of separately billable procedures and treating other patients. Critical care was necessary to treat or prevent imminent or life-threatening deterioration. Critical care was time spent personally by me on the following activities: development of treatment plan with patient  and/or surrogate as well as nursing, discussions with consultants, evaluation of patient's response to treatment, examination of patient, obtaining history from patient or surrogate, ordering and performing treatments and interventions, ordering and review of laboratory studies, ordering and review of radiographic studies, pulse oximetry and re-evaluation of patient's condition.      EKG Interpretation  Date/Time:  Sunday May 31 2016 14:32:59 EDT Ventricular Rate:  94 PR Interval:    QRS Duration: 116 QT Interval:  336 QTC Calculation: 421 R Axis:   83 Text Interpretation:  Sinus rhythm Incomplete right bundle branch block Repol abnrm suggests ischemia, diffuse leads ST elevation, consider anterior injury concern for acute STEMI Confirmed by [facility] MD, Saturnino Liew [phone]) on 05/31/2016 5:33:21 PM       EKG Interpretation  Date/Time:  Sunday May 31 2016 14:55:50 EDT Ventricular Rate:  77 PR Interval:    QRS Duration: 113 QT Interval:  371 QTC Calculation: 420 R Axis:   86 Text Interpretation:  Sinus rhythm Probable LVH with secondary repol abnrm ST depr, consider ischemia, inferior leads Anterior ST elevation, probably due to LVH similar to earlier today Confirmed by [facility] MD, Corene Cornea [phone]) on 05/31/2016 5:34:42 PM        MDM  60 year old male arrives here in respiratory distress. Examination patient hypertensive tachycardic tachypneic with accessory muscle usage and diaphoretic. Complaining of chest pain. His pulmonary exam showed bilateral lower lobe crackles with some  diffuse wheezing overall clear lung sounds more superiorly. Initial concern was for pulmonary edema however his EKG had ST elevations in aVR and lead V1 with nothing to compare to and secondary to concurrent multiple ST depressions so a code STEMI was called. Patient started on nitroglycerin infusion along with heparin infusion and ASA given. After discussing with cardiology STEMI team, Dr. Irish Lack, it was  decided this may be LVH with repolarization changes along with hypertensive crisis and flash pulmonary edema rather than a true STEMI. Dr. Irish Lack elected to wait until labs and repeat EKG with further observation before consideration of catheterization. Patient first initially improved slightly with nitroglycerin and BiPAP however he became tachypneic and worsening distress again. Tried increasing BiPAP settings and nitroglycerin without improvement and his mental status started to decline was intubated for hypoxic respiratory failure and airway protection. His nitroglycerin was titrated up to a maximum of her 150/m, Lasix 40 mg IV was given and after patient given propofol and other sedating medications his blood pressure started to improve so nitroglycerin was weaned down. Cardiology team updated on positive troponin and repeat EKG with similar findings and patient's respiratory status. Patient was transferred to the ICU at [facility] by carelink without further events or further recs from cardiology.       Merrily Pew, MD 05/31/16 1743

## 2016-05-31 NOTE — ED Notes (Addendum)
Pt came into ED and noted to be in respiratory distress.  Diaphoretic, tachypnea.  Language barrier-pt speaks [place].  Pt was brought in by friends who also speak limited english.  Translator phones used to attempt communication with little success.  Pt did deny CP.  Denies N/V.  Pt reports that the SOB started on Tuesday and that he went to the doctor (unsure where) and was told that he had fluid in his lungs.  Pt does have crackles at the bilateral bases of his lungs.    Pt started on Bi-pap by RT.  EDP remains at bedside during the application.  Spoke with Janett Billow, Pharmacy who dosed heparin.  Pt also started on Nitro at 46mcg, titrated for pressure and SOB.    Pt tolerated bipap well and was improving for a short time before becoming tachypnea and diaphoretic again.  The decision was made to intubate the pt for respiratory distress and hypoxia.  Pt moved to room 14, RT, NT, RN, EDP and radiology at bedside.  Intubation successful using 20mg  etomidate and 100mg  succ.  Pt started on propofol after intubation and titrated for sedation.  32mcg/kg/min when pt transferred.    Pt fighting the vent, given 140mcg fentanyl and 2mg  versed.   Carelink at bedside throughout the duration.  pts belongings (cut pants and underwear) and belt sent with carelink.   pts friends updated on pt going to [facility] and his condition prior to intubation.

## 2016-05-31 NOTE — Progress Notes (Signed)
Pt transported from cath lab to [facility] 08 without event.

## 2016-05-31 NOTE — Progress Notes (Signed)
  Echocardiogram 2D Echocardiogram has been performed.  Tresa Res 05/31/2016, 5:43 PM

## 2016-05-31 NOTE — Progress Notes (Signed)
   Called about abnormal ECG in a patient with respiratory distress.  ECG did not meet STEMI criteria.  Also patient with Stage IV kidney disease.  He is now intubated with bilateral pleural effusions and hypoxic respiratory failure.  LVH with repol changes noted by ECG- no old ECG available for comparison.  Will check echocardiogram upon arrival to [facility].  Jettie Booze, MD

## 2016-05-31 NOTE — ED Provider Notes (Signed)
[facility] [place] DEPT [facility] Provider Note   CSN: [id number] Arrival date & time: 05/31/16  1422     History   Chief Complaint Chief Complaint  Patient presents with  . Shortness of Breath   History obtained via [name] phone and with patient's wife help HPI  Kenneth [name] is a 60 y.o. 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 Patient did not know the name of the blood thinner but last dose last night. Patient reports he started to have swelling in his legs and abdomen 5 days ago and started to have shortness of breath and chest pain 4 days ago. He has continued to worsen until today making him present to the hospital. He denies ever having a heart attack or been told he has heart problems. He denies recent illnesses, nausea, vomiting.   Patient Active Problem List   Diagnosis Date Noted  . Pulmonary edema 05/31/2016  . 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 Pain in lower limb 02/06/2015  . Ischemic rest pain of lower extremity ([place]) 02/06/2015    No past surgical history on file.     Home Medications    Prior to Admission medications   Medication Sig Start Date End Date Taking? Authorizing Provider  atorvastatin (LIPITOR) 20 MG tablet  03/26/15   Historical Provider, MD  gabapentin (NEURONTIN) 300 MG capsule Take 1 capsule (300 mg total) by mouth 3 (three) times daily. 03/29/15   Myeong O Sheard, DPM  HUMALOG KWIKPEN 100 UNIT/ML KiwkPen  03/07/15   Historical Provider, MD  LANTUS SOLOSTAR 100 UNIT/ML Solostar Pen  03/07/15   Historical Provider, MD  lidocaine (LIDODERM) 5 % Place 1 patch onto the skin daily. Remove & Discard patch within 12 hours or as directed by MD 02/06/15   Myeong Roxine Caddy, DPM  losartan (COZAAR) 50 MG tablet  03/26/15   Historical  Provider, MD    Family History No family history on file.  Social History Social History  Substance Use Topics  . Smoking status: Former Research scientist (life sciences)  . Smokeless tobacco: Never Used  . Alcohol use Not on file     Allergies   Review of patient's allergies indicates no known allergies.   Review of Systems Review of Systems  Constitutional: Positive for fatigue.  HENT: Negative.   Respiratory: Positive for shortness of breath.   Cardiovascular: Positive for chest pain and leg swelling.  Gastrointestinal: Negative.      Physical Exam Updated Vital Signs BP 122/71   Pulse 65   Resp (!) 33   Ht _0  (1.676 m)   Wt 63.5 kg   SpO2 100%   BMI 22.60 kg/m   Physical Exam  Constitutional: He appears distressed.  HENT:  Head: Normocephalic.  Cardiovascular:  No murmur heard. tachycardic  Pulmonary/Chest: He is in respiratory distress.  On Bipap  Abdominal: Soft. He exhibits distension. There is no tenderness.  Musculoskeletal: He exhibits edema. He exhibits no tenderness.  Skin: Skin is warm. He is diaphoretic.     ED Treatments / Results  Labs (all labs ordered are listed, but only abnormal results are displayed) Labs Reviewed  BASIC METABOLIC PANEL - Abnormal; Notable for the following:       Result Value   Potassium 5.4 (*)    CO2 17 (*)    Glucose, Bld  314 (*)    BUN 77 (*)    Creatinine, Ser 2.69 (*)    Calcium 8.6 (*)    GFR calc non Af Amer 24 (*)    GFR calc Af Amer 28 (*)    All other components within normal limits  CBC - Abnormal; Notable for the following:    RBC 3.39 (*)    Hemoglobin 10.1 (*)    HCT 30.7 (*)    All other components within normal limits  TROPONIN I - Abnormal; Notable for the following:    Troponin I 1.43 (*)    All other components within normal limits  BRAIN NATRIURETIC PEPTIDE - Abnormal; Notable for the following:    B Natriuretic Peptide 2,537.9 (*)    All other components within normal limits  CBG MONITORING, ED -  Abnormal; Notable for the following:    Glucose-Capillary 300 (*)    All other components within normal limits  I-STAT ARTERIAL BLOOD GAS, ED - Abnormal; Notable for the following:    pH, Arterial 7.225 (*)    pO2, Arterial 316.0 (*)    Bicarbonate 16.4 (*)    Acid-base deficit 11.0 (*)    All other components within normal limits  MRSA PCR SCREENING  PROTIME-INR  HEPARIN LEVEL (UNFRACTIONATED)  TRIGLYCERIDES    EKG  EKG Interpretation None       Radiology Dg Chest Portable 1 View  Result Date: 05/31/2016 CLINICAL DATA:  Chest pain.  Post intubation. EXAM: PORTABLE CHEST 1 VIEW COMPARISON:  05/31/2016 FINDINGS: Interval placement of a nasogastric tube which courses into the region of the stomach as tip is not visualized. Interval placement of endotracheal tube with tip 7.5 cm above the carina. Lungs are adequately inflated demonstrate persistent bilateral perihilar bibasilar opacification likely due to interstitial edema with moderate bilateral pleural effusions and bibasilar atelectasis. Cannot exclude infection. Mild stable cardiomegaly. Remainder the exam is unchanged. IMPRESSION: Constellation of findings likely representing moderate interstitial edema/ CHF with moderate bilateral effusions/bibasilar atelectasis. Cannot exclude infection in the mid to lower lungs. Tubes and lines as described. Electronically Signed   By: Marin Olp M.D.   On: 05/31/2016 15:59   Dg Chest Port 1 View  Result Date: 05/31/2016 CLINICAL DATA:  Patient with chest pain. EXAM: PORTABLE CHEST 1 VIEW COMPARISON:  None. FINDINGS: Patient is rotated to the right. Cardiomegaly. Bilateral perihilar and lower lung interstitial opacities and consolidation. Moderate bilateral pleural effusions. Biapical pleural parenchymal thickening. No pneumothorax. IMPRESSION: Cardiomegaly. Bilateral perihilar and lower lung interstitial opacities and consolidation favored to represent edema. Infection could have a similar  appearance. Moderate bilateral pleural effusions. Electronically Signed   By: Lovey Newcomer M.D.   On: 05/31/2016 15:23    Procedures Procedures (including critical care time)  Medications Ordered in ED Medications  heparin ADULT infusion 100 units/mL (25000 units/272m sodium chloride 0.45%) (750 Units/hr Intravenous New Bag/Given 05/31/16 1504)  propofol (DIPRIVAN) 10 mg/mL bolus/IV push (  Not Given 05/31/16 1648)  fentaNYL (SUBLIMAZE) injection 50 mcg (not administered)  fentaNYL (SUBLIMAZE) 2,500 mcg in sodium chloride 0.9 % 250 mL (10 mcg/mL) infusion (not administered)  fentaNYL (SUBLIMAZE) bolus via infusion 50 mcg (not administered)  propofol (DIPRIVAN) 1000 MG/100ML infusion (30 mcg/kg/min  63.5 kg Intravenous Transfusing/Transfer 05/31/16 1707)  docusate (COLACE) 50 MG/5ML liquid 100 mg (not administered)  bisacodyl (DULCOLAX) suppository 10 mg (not administered)  chlorhexidine gluconate (MEDLINE KIT) (PERIDEX) 0.12 % solution 15 mL (not administered)  MEDLINE mouth rinse (15 mLs Mouth Rinse Given  05/31/16 1706)  nitroGLYCERIN 50 mg in dextrose 5 % 250 mL (0.2 mg/mL) infusion (50 mcg/min Intravenous New Bag/Given 05/31/16 1443)  aspirin 81 MG chewable tablet (  Given 05/31/16 1456)  nitroGLYCERIN 0.2 mg/mL in dextrose 5 % infusion (  Stopped 05/31/16 1644)  heparin bolus via infusion 4,000 Units (4,000 Units Intravenous Bolus from Bag 05/31/16 1504)  heparin 100-0.45 UNIT/ML-% infusion (  New Bag/Given 05/31/16 1642)  furosemide (LASIX) 10 MG/ML injection (40 mg  Given 05/31/16 1642)  propofol (DIPRIVAN) 1000 MG/100ML infusion (40 mcg/kg/min  New Bag/Given 05/31/16 1642)  fentaNYL (SUBLIMAZE) 100 MCG/2ML injection (100 mcg  Given 05/31/16 1643)  midazolam (VERSED) 2 MG/2ML injection (2 mg  Given 05/31/16 1644)  fentaNYL (SUBLIMAZE) 100 MCG/2ML injection (100 mcg  Given 05/31/16 1706)     Initial Impression / Assessment and Plan / ED Course  I have reviewed the triage vital signs and the  nursing notes.  Pertinent labs & imaging results that were available during my care of the patient were reviewed by me and considered in my medical decision making (see chart for details).  Clinical Course  Comment By Time  Spoke with Dr. Bryn Gulling, cancel code STEMI. Will reeval after troponin.   Merrily Pew, MD 09/17 1450  On arrival to the emergency department patient was noted to be in respiratory distress. Patient initially started on BiPAP but respiratory status continued to worsen and patient was intubated. Veatrice Bourbon, MD 09/17 1646      Final Clinical Impressions(s) / ED Diagnoses   Final diagnoses:  Acute pulmonary edema ([place])   60 year old male with history of diabetes, hypercholesterolemia, hypertension, DVTs presented in acute respiratory distress. Chest xray was significant for pulmonary edema. EKG was significant for changes that were initially thought to be due to a STEMI, however after calling Cardiology at [facility] they felt his acute pulmonary edema was causing his EKG changes but stated he required further cardiology work up.  Patient was initially started on Bipap, IV lasix and Nitroglycerin drip with some improvement in his respiratory status but he again started to decline and was intubated. He was additionally started on a heparin drip and given aspirin. Once medically stable, he was transferred to [facility] for further management of his acute issues. Cardiology will continue to follow.   New Prescriptions Current Discharge Medication List       Harbor Vanover A. Lincoln Brigham MD, [facility] Resident PGY-3 Pager [phone]    Veatrice Bourbon, MD 05/31/16 [place], MD 05/31/16 [phone]

## 2016-05-31 NOTE — Consult Note (Signed)
CARDIOLOGY CONSULT NOTE      Patient ID: Kenneth [name] MRN: [id number] DOB/AGE: [birth year] 60 y.o.  Admit date: 05/31/2016 Referring PhysicianDaniel Lily Kocher, MD Primary Ann Maki, MD Primary Cardiologist new Reason for Consultation: Acute respiratory failure  HPI: 60 year old man with a history of poorly controlled diabetes, hypertension and hyperlipidemia. Over the past few weeks, he had not been feeling well. He thought he got the flu. He was seen at an urgent care in [facility] and was treated for bronchitis. Over the past several days, he has felt worse. He has been unable to lie flat. He had chest discomfort when lying flat. Both shortness of breath and chest discomfort were better when sitting forward.  He also noted lower extremity edema.  He came to [facility] earlier today. He was short of breath with borderline oxygen saturations.  Initial concern for STEMI. I personally reviewed the ECG and did not feel like it met STEMI criteria. It looked like he had LVH with some repolarization abnormality. The patient has known chronic kidney disease, stage IV. Troponin was 1.4. The patient required intubation. He came to [facility] and was intermittently hypotensive at times. Due to the hypotension, we reconsidered cardiac cath. Emergency echocardiogram showed global LV dysfunction but possibly somewhat worse in the anterior distribution.  Review of systems complete and found to be negative unless listed above   No past medical history on file.  No family history on file.  Social History   Social History  . Marital status: Unknown    Spouse name: N/A  . Number of children: N/A  . Years of education: N/A   Occupational History  . Not on file.   Social History Main Topics  . Smoking status: Former Research scientist (life sciences)  . Smokeless tobacco: Never Used  . Alcohol use Not on file  . Drug use: Unknown  . Sexual activity: Not on file   Other Topics Concern  . Not on file     Social History Narrative  . No narrative on file    No past surgical history on file.   Prescriptions Prior to Admission  Medication Sig Dispense Refill Last Dose  . albuterol (PROVENTIL HFA;VENTOLIN HFA) 108 (90 Base) MCG/ACT inhaler Inhale 2 puffs into the lungs every 6 (six) hours as needed for wheezing.   unknown  . atorvastatin (LIPITOR) 40 MG tablet Take 40 mg by mouth at bedtime.   0 05/30/2016 at Unknown time  . doxycycline (VIBRA-TABS) 100 MG tablet Take 100 mg by mouth 2 (two) times daily. 7 day course filled 05/25/16  0 05/30/2016 at Unknown time  . gabapentin (NEURONTIN) 300 MG capsule Take 1 capsule (300 mg total) by mouth 3 (three) times daily. (Patient taking differently: Take 900 mg by mouth at bedtime. ) 90 capsule 2 05/30/2016 at Unknown time  . ibuprofen (ADVIL,MOTRIN) 200 MG tablet Take 400 mg by mouth every 6 (six) hours as needed (pain).   week ago  . Insulin Human (INSULIN PUMP) SOLN Inject into the skin continuous. Novolog   05/31/2016 at Unknown time  . losartan-hydrochlorothiazide (HYZAAR) 50-12.5 MG tablet Take 1 tablet by mouth at bedtime.   2 05/30/2016 at Unknown time  . Multiple Vitamin (MULTIVITAMIN WITH MINERALS) TABS tablet Take 1 tablet by mouth at bedtime.   05/30/2016 at Unknown time  . lidocaine (LIDODERM) 5 % Place 1 patch onto the skin daily. Remove & Discard patch within 12 hours or as directed by MD (Patient not taking: Reported  on 05/31/2016) 10 patch 2 Not Taking at Unknown time    Physical Exam: Vitals:   Vitals:   05/31/16 1530 05/31/16 1540 05/31/16 1604 05/31/16 1705  BP: (!) 194/121   122/71  Pulse:    65  Resp: 13   (!) 33  SpO2:  100% 100% 100%  Weight:      Height:       I&O's:   Intake/Output Summary (Last 24 hours) at 05/31/16 1837 Last data filed at 05/31/16 1652  Gross per 24 hour  Intake                0 ml  Output              250 ml  Net             -250 ml   Physical exam: Intubated, sedated [place]/AT EOMI No JVD, No carotid  bruit RRR S1S2  No wheezing Soft. NT, nondistended Bilateral 2+ lower extremity edema. 2+ right dorsalis pedis pulse No focal motor or sensory deficits Normal affect Healed excoriations noted on the lower extremities  Labs:   Lab Results  Component Value Date   WBC 10.3 05/31/2016   HGB 10.1 (L) 05/31/2016   HCT 30.7 (L) 05/31/2016   MCV 90.6 05/31/2016   PLT 326 05/31/2016    Recent Labs Lab 05/31/16 1435  NA 135  K 5.4*  CL 110  CO2 17*  BUN 77*  CREATININE 2.69*  CALCIUM 8.6*  GLUCOSE 314*   Lab Results  Component Value Date   TROPONINI 1.43 ([place]) 05/31/2016   No results found for: CHOL No results found for: HDL No results found for: LDLCALC No results found for: TRIG No results found for: CHOLHDL No results found for: LDLDIRECT    Radiology: Moderate bilateral pleural effusions, worse since the prior x-ray a week ago. EKG: Normal sinus rhythm, anterolateral ST depressions in a pattern of LVH  ASSESSMENT AND PLAN:  Active Problems:   Pulmonary edema   Acute respiratory failure (HCC) Elevated troponin Hypotension, concerning for cardiogenic shock in the setting of decreased left ventricular function  1) the risks and benefits of cardiac catheterization were explained to the patient's wife and other church members who are present. We specifically talked about risk of renal dysfunction and bleeding. We talked as well about potentially leaving the balloon pump in place to help perfusion. He'll also need a Swan-Ganz catheter to help with fluid management.  2) ventilator management per pulmonary/ critical care.  3) diabetes: Continue aggressive diabetes management.  4) hyperlipidemia: Continue atorvastatin.  We'll try to minimize contrast dye exposure during the procedure to reduce the risk of contrast-induced nephropathy. Since this Swan-Ganz may be more long-term, we'll plan for right IJ approach.   Critical care time 105 minutes  Signed:   Mina Marble, MD, [facility] 05/31/2016, 6:37 PM

## 2016-05-31 NOTE — Progress Notes (Signed)
[place] for Heparin Indication: chest pain/ACS; now with IABP in place  No Known Allergies  Patient Measurements: Height: 5\' 6"  (167.6 cm) Weight: 162 lb 14.7 oz (73.9 kg) IBW/kg (Calculated) : 63.8 Heparin Dosing Weight: 63.5 kg  Vital Signs: Temp: 95.5 F (35.3 C) (09/17 2100) BP: 116/71 (09/17 2100) Pulse Rate: 57 (09/17 2100)  Labs:  Recent Labs  05/31/16 1435 05/31/16 1824  HGB 10.1*  --   HCT 30.7*  --   PLT 326  --   LABPROT 12.8  --   INR 0.96  --   CREATININE 2.69* 2.84*  TROPONINI 1.43* 1.68*    Estimated Creatinine Clearance: 25.6 mL/min (by C-G formula based on SCr of 2.84 mg/dL (H)).   Medical History: No past medical history on file.  Assessment: 60 year old male at [facility] ED with ACS to start IV heparin.   Patient does not speak [place]- requires an interpretor. Currently on BiPAP and on NTG.  Question of Coumadin pta? -However upon further investigation was confusing with Neurontin. Checking INR just in case but will proceed with Heparin.  Hgb 10.1, Plts 326. -Consistent with labs in care everywhere. Transferring to [facility] for Cards evaluation.   Patient now s/p IABP placement in cath lab.  Heparin resumed at 750 units/hr at around 8 PM.    Goal of Therapy:  Heparin level 0.2-0.5 on IABP Monitor platelets by anticoagulation protocol: Yes   Plan:  1. Check heparin level at 2 AM. 2. Daily heparin level and CBC. 3. F/u plans for CABG.  Uvaldo Rising, BCPS  Clinical Pharmacist Pager [phone]  05/31/2016 10:02 PM

## 2016-05-31 NOTE — ED Notes (Signed)
After pt intubated, pt placed on [employer]links cardiac monitor and ventilator.  Vital signs obtained every 3 minutes.

## 2016-05-31 NOTE — Progress Notes (Signed)
ANTICOAGULATION CONSULT NOTE - Initial Consult  Pharmacy Consult for Heparin Indication: chest pain/ACS  No Known Allergies  Patient Measurements: Height: 5\' 6"  (167.6 cm) Weight: 140 lb (63.5 kg) IBW/kg (Calculated) : 63.8 Heparin Dosing Weight: 63.5 kg  Vital Signs: BP: 157/93 (09/17 1445) Pulse Rate: 78 (09/17 1445)  Labs: No results for input(s): HGB, HCT, PLT, APTT, LABPROT, INR, HEPARINUNFRC, HEPRLOWMOCWT, CREATININE, CKTOTAL, CKMB, TROPONINI in the last 72 hours.  CrCl cannot be calculated (No order found.).   Medical History: No past medical history on file.  Assessment: 60 year old male at [facility] ED with ACS to start IV heparin.   Patient does not speak [place]- requires an interpretor. Currently on BiPAP and on NTG.  Question of Coumadin pta? -However upon further investigation was confusing with Neurontin. Checking INR just in case but will proceed with Heparin.  Hgb 10.1, Plts 326. -Consistent with labs in care everywhere. Transferring to [facility] for Cards evaluation.    Goal of Therapy:  Heparin level 0.3-0.7 units/ml Monitor platelets by anticoagulation protocol: Yes   Plan:  Heparin bolus of 4000 units.  Heparin drip 750 units/hr.  Heparin level in 6 hours.  Daily heparin level and CBC.   Sloan Leiter, PharmD, BCPS Clinical Pharmacist [phone] 05/31/2016,2:54 PM

## 2016-06-01 ENCOUNTER — Inpatient Hospital Stay (HOSPITAL_COMMUNITY): Payer: BLUE CROSS/BLUE SHIELD

## 2016-06-01 ENCOUNTER — Other Ambulatory Visit (HOSPITAL_COMMUNITY): Payer: BLUE CROSS/BLUE SHIELD

## 2016-06-01 ENCOUNTER — Encounter (HOSPITAL_COMMUNITY): Payer: BLUE CROSS/BLUE SHIELD

## 2016-06-01 ENCOUNTER — Encounter (HOSPITAL_COMMUNITY): Payer: Self-pay | Admitting: Interventional Cardiology

## 2016-06-01 DIAGNOSIS — I25110 Atherosclerotic heart disease of native coronary artery with unstable angina pectoris: Secondary | ICD-10-CM

## 2016-06-01 LAB — POCT I-STAT 3, ART BLOOD GAS (G3+)
ACID-BASE DEFICIT: 3 mmol/L — AB (ref 0.0–2.0)
Acid-base deficit: 11 mmol/L — ABNORMAL HIGH (ref 0.0–2.0)
Acid-base deficit: 8 mmol/L — ABNORMAL HIGH (ref 0.0–2.0)
BICARBONATE: 15.2 mmol/L — AB (ref 20.0–28.0)
BICARBONATE: 21.9 mmol/L (ref 20.0–28.0)
Bicarbonate: 17.2 mmol/L — ABNORMAL LOW (ref 20.0–28.0)
O2 Saturation: 99 %
O2 Saturation: 99 %
O2 Saturation: 94 %
PCO2 ART: 33.8 mmHg (ref 32.0–48.0)
PH ART: 7.262 — AB (ref 7.350–7.450)
PH ART: 7.319 — AB (ref 7.350–7.450)
TCO2: 16 mmol/L (ref 0–100)
TCO2: 18 mmol/L (ref 0–100)
TCO2: 23 mmol/L (ref 0–100)
pCO2 arterial: 33.4 mmHg (ref 32.0–48.0)
pCO2 arterial: 36 mmHg (ref 32.0–48.0)
pH, Arterial: 7.392 (ref 7.350–7.450)
pO2, Arterial: 174 mmHg — ABNORMAL HIGH (ref 83.0–108.0)
pO2, Arterial: 146 mmHg — ABNORMAL HIGH (ref 83.0–108.0)
pO2, Arterial: 70 mmHg — ABNORMAL LOW (ref 83.0–108.0)

## 2016-06-01 LAB — CBC
HCT: 25.6 % — ABNORMAL LOW (ref 39.0–52.0)
Hemoglobin: 8.3 g/dL — ABNORMAL LOW (ref 13.0–17.0)
MCH: 29.2 pg (ref 26.0–34.0)
MCHC: 32.4 g/dL (ref 30.0–36.0)
MCV: 90.1 fL (ref 78.0–100.0)
Platelets: 236 10*3/uL (ref 150–400)
RBC: 2.84 MIL/uL — ABNORMAL LOW (ref 4.22–5.81)
RDW: 13.7 % (ref 11.5–15.5)
WBC: 8.5 10*3/uL (ref 4.0–10.5)

## 2016-06-01 LAB — SURGICAL PCR SCREEN
MRSA, PCR: NEGATIVE
Staphylococcus aureus: POSITIVE — AB

## 2016-06-01 LAB — POCT I-STAT 3, VENOUS BLOOD GAS (G3P V)
ACID-BASE DEFICIT: 11 mmol/L — AB (ref 0.0–2.0)
Acid-base deficit: 11 mmol/L — ABNORMAL HIGH (ref 0.0–2.0)
BICARBONATE: 16.6 mmol/L — AB (ref 20.0–28.0)
Bicarbonate: 16.4 mmol/L — ABNORMAL LOW (ref 20.0–28.0)
O2 SAT: 50 %
O2 SAT: 58 %
PCO2 VEN: 41.6 mmHg — AB (ref 44.0–60.0)
PO2 VEN: 33 mmHg (ref 32.0–45.0)
PO2 VEN: 37 mmHg (ref 32.0–45.0)
TCO2: 18 mmol/L (ref 0–100)
TCO2: 18 mmol/L (ref 0–100)
pCO2, Ven: 41.9 mmHg — ABNORMAL LOW (ref 44.0–60.0)
pH, Ven: 7.203 — ABNORMAL LOW (ref 7.250–7.430)
pH, Ven: 7.204 — ABNORMAL LOW (ref 7.250–7.430)

## 2016-06-01 LAB — ABO/RH: ABO/RH(D): A POS

## 2016-06-01 LAB — BASIC METABOLIC PANEL
Anion gap: 7 (ref 5–15)
Anion gap: 7 (ref 5–15)
BUN: 80 mg/dL — AB (ref 6–20)
BUN: 82 mg/dL — AB (ref 6–20)
CALCIUM: 8.4 mg/dL — AB (ref 8.9–10.3)
CHLORIDE: 111 mmol/L (ref 101–111)
CO2: 18 mmol/L — ABNORMAL LOW (ref 22–32)
CO2: 19 mmol/L — AB (ref 22–32)
CREATININE: 2.66 mg/dL — AB (ref 0.61–1.24)
Calcium: 8.1 mg/dL — ABNORMAL LOW (ref 8.9–10.3)
Chloride: 112 mmol/L — ABNORMAL HIGH (ref 101–111)
Creatinine, Ser: 2.58 mg/dL — ABNORMAL HIGH (ref 0.61–1.24)
GFR calc Af Amer: 29 mL/min — ABNORMAL LOW (ref >60)
GFR calc Af Amer: 30 mL/min — ABNORMAL LOW (ref >60)
GFR calc non Af Amer: 26 mL/min — ABNORMAL LOW (ref >60)
GFR, EST NON AFRICAN AMERICAN: 25 mL/min — AB (ref >60)
GLUCOSE: 172 mg/dL — AB (ref 65–99)
Glucose, Bld: 127 mg/dL — ABNORMAL HIGH (ref 65–99)
POTASSIUM: 4.1 mmol/L (ref 3.5–5.1)
Potassium: 4.9 mmol/L (ref 3.5–5.1)
SODIUM: 137 mmol/L (ref 135–145)
Sodium: 137 mmol/L (ref 135–145)

## 2016-06-01 LAB — TROPONIN I
TROPONIN I: 2.9 ng/mL — AB (ref <0.03)
Troponin I: 2.46 ng/mL (ref <0.03)

## 2016-06-01 LAB — URINALYSIS, ROUTINE W REFLEX MICROSCOPIC
Bilirubin Urine: NEGATIVE
GLUCOSE, UA: NEGATIVE mg/dL
KETONES UR: NEGATIVE mg/dL
LEUKOCYTES UA: NEGATIVE
Nitrite: NEGATIVE
PROTEIN: 100 mg/dL — AB
Specific Gravity, Urine: 1.011 (ref 1.005–1.030)
pH: 5 (ref 5.0–8.0)

## 2016-06-01 LAB — TYPE AND SCREEN
ABO/RH(D): A POS
ANTIBODY SCREEN: NEGATIVE

## 2016-06-01 LAB — GLUCOSE, CAPILLARY
GLUCOSE-CAPILLARY: 168 mg/dL — AB (ref 65–99)
GLUCOSE-CAPILLARY: 68 mg/dL (ref 65–99)
GLUCOSE-CAPILLARY: 162 mg/dL — AB (ref 65–99)
GLUCOSE-CAPILLARY: 122 mg/dL — AB (ref 65–99)
Glucose-Capillary: 104 mg/dL — ABNORMAL HIGH (ref 65–99)
Glucose-Capillary: 129 mg/dL — ABNORMAL HIGH (ref 65–99)
Glucose-Capillary: 164 mg/dL — ABNORMAL HIGH (ref 65–99)

## 2016-06-01 LAB — CARBOXYHEMOGLOBIN
Carboxyhemoglobin: 1.3 % (ref 0.5–1.5)
Methemoglobin: 1.3 % (ref 0.0–1.5)
O2 Saturation: 86.7 %
Total hemoglobin: 8.9 g/dL — ABNORMAL LOW (ref 12.0–16.0)

## 2016-06-01 LAB — URINE MICROSCOPIC-ADD ON: WBC, UA: NONE SEEN WBC/hpf (ref 0–5)

## 2016-06-01 LAB — PHOSPHORUS
Phosphorus: 5.1 mg/dL — ABNORMAL HIGH (ref 2.5–4.6)
Phosphorus: 5.4 mg/dL — ABNORMAL HIGH (ref 2.5–4.6)

## 2016-06-01 LAB — LACTIC ACID, PLASMA
Lactic Acid, Venous: 0.8 mmol/L (ref 0.5–1.9)
Lactic Acid, Venous: 1 mmol/L (ref 0.5–1.9)

## 2016-06-01 LAB — MAGNESIUM
MAGNESIUM: 1.7 mg/dL (ref 1.7–2.4)
Magnesium: 1.9 mg/dL (ref 1.7–2.4)

## 2016-06-01 LAB — HEPARIN LEVEL (UNFRACTIONATED): Heparin Unfractionated: 0.31 IU/mL (ref 0.30–0.70)

## 2016-06-01 MED ORDER — HYDRALAZINE HCL 25 MG PO TABS
12.5000 mg | ORAL_TABLET | Freq: Three times a day (TID) | ORAL | Status: DC
  Administered 2016-06-01 – 2016-06-04 (×10): 12.5 mg via ORAL
  Filled 2016-06-01 (×10): qty 1

## 2016-06-01 MED ORDER — PANTOPRAZOLE SODIUM 40 MG PO PACK
40.0000 mg | PACK | ORAL | Status: DC
  Administered 2016-06-02 – 2016-06-05 (×4): 40 mg
  Filled 2016-06-01 (×4): qty 20

## 2016-06-01 MED ORDER — AMIODARONE HCL 150 MG/3ML IV SOLN: 150.0000 mg | Freq: Once | INTRAVENOUS | Status: DC

## 2016-06-01 MED ORDER — VITAL AF 1.2 CAL PO LIQD
1000.0000 mL | ORAL | Status: DC
  Administered 2016-06-01 – 2016-06-04 (×3): 1000 mL

## 2016-06-01 MED ORDER — ACETAMINOPHEN 160 MG/5ML PO SOLN
650.0000 mg | Freq: Four times a day (QID) | ORAL | Status: DC | PRN
  Administered 2016-06-04 (×2): 650 mg via ORAL
  Filled 2016-06-01 (×2): qty 20.3

## 2016-06-01 MED ORDER — ATORVASTATIN CALCIUM 80 MG PO TABS
80.0000 mg | ORAL_TABLET | Freq: Every day | ORAL | Status: DC
  Administered 2016-06-01 – 2016-06-23 (×21): 80 mg via ORAL
  Filled 2016-06-01 (×21): qty 1

## 2016-06-01 MED ORDER — ISOSORBIDE DINITRATE 10 MG PO TABS
5.0000 mg | ORAL_TABLET | Freq: Three times a day (TID) | ORAL | Status: DC
  Administered 2016-06-01 – 2016-06-04 (×8): 5 mg via ORAL
  Filled 2016-06-01 (×10): qty 1

## 2016-06-01 MED ORDER — AMIODARONE HCL IN DEXTROSE 360-4.14 MG/200ML-% IV SOLN
INTRAVENOUS | Status: AC
  Filled 2016-06-01: qty 200

## 2016-06-01 MED ORDER — INFLUENZA VAC SPLIT QUAD 0.5 ML IM SUSY: 0.5000 mL | PREFILLED_SYRINGE | INTRAMUSCULAR | Status: DC | PRN

## 2016-06-01 MED ORDER — SODIUM BICARBONATE 8.4 % IV SOLN
50.0000 meq | Freq: Once | INTRAVENOUS | Status: AC
  Administered 2016-06-01: 50 meq via INTRAVENOUS
  Filled 2016-06-01: qty 50

## 2016-06-01 MED ORDER — CHLORHEXIDINE GLUCONATE CLOTH 2 % EX PADS
6.0000 | MEDICATED_PAD | Freq: Every day | CUTANEOUS | Status: AC
  Administered 2016-06-01 – 2016-06-05 (×5): 6 via TOPICAL

## 2016-06-01 MED ORDER — MUPIROCIN 2 % EX OINT
1.0000 "application " | TOPICAL_OINTMENT | Freq: Two times a day (BID) | CUTANEOUS | Status: DC
  Administered 2016-06-01 – 2016-06-05 (×8): 1 via NASAL
  Filled 2016-06-01 (×2): qty 22

## 2016-06-01 MED ORDER — AMIODARONE IV BOLUS ONLY 150 MG/100ML
150.0000 mg | Freq: Once | INTRAVENOUS | Status: AC
  Administered 2016-06-01: 150 mg via INTRAVENOUS
  Filled 2016-06-01: qty 100

## 2016-06-01 MED ORDER — SODIUM CHLORIDE 0.9% FLUSH
10.0000 mL | Freq: Two times a day (BID) | INTRAVENOUS | Status: DC
  Administered 2016-06-01 (×2): 10 mL
  Administered 2016-06-02: 20 mL
  Administered 2016-06-02 – 2016-06-05 (×6): 10 mL

## 2016-06-01 MED ORDER — PROPOFOL 1000 MG/100ML IV EMUL
0.0000 ug/kg/min | INTRAVENOUS | Status: DC
  Administered 2016-06-01: 5 ug/kg/min via INTRAVENOUS
  Administered 2016-06-02 (×3): 15 ug/kg/min via INTRAVENOUS
  Administered 2016-06-02: 10 ug/kg/min via INTRAVENOUS
  Administered 2016-06-03: 15 ug/kg/min via INTRAVENOUS
  Administered 2016-06-03: 40 ug/kg/min via INTRAVENOUS
  Administered 2016-06-05: 25 ug/kg/min via INTRAVENOUS
  Filled 2016-06-01 (×9): qty 100

## 2016-06-01 MED ORDER — SODIUM CHLORIDE 0.9 % IV SOLN
INTRAVENOUS | Status: DC | PRN
Start: 2016-06-01 — End: 2016-06-06
  Administered 2016-06-01: 19:00:00 via INTRA_ARTERIAL

## 2016-06-01 MED ORDER — AMIODARONE HCL IN DEXTROSE 360-4.14 MG/200ML-% IV SOLN
30.0000 mg/h | INTRAVENOUS | Status: DC
  Administered 2016-06-02: 30 mg/h via INTRAVENOUS
  Filled 2016-06-01: qty 200

## 2016-06-01 MED ORDER — DEXTROSE 50 % IV SOLN
INTRAVENOUS | Status: AC
  Administered 2016-06-01: 25 mL
  Filled 2016-06-01: qty 50

## 2016-06-01 MED ORDER — AMIODARONE HCL IN DEXTROSE 360-4.14 MG/200ML-% IV SOLN
60.0000 mg/h | INTRAVENOUS | Status: DC
  Administered 2016-06-01: 60 mg/h via INTRAVENOUS

## 2016-06-01 MED ORDER — SODIUM CHLORIDE 0.9% FLUSH: 10.0000 mL | INTRAVENOUS | Status: DC | PRN

## 2016-06-01 NOTE — Consult Note (Signed)
[address]       [place] [address]             [phone]        Kenneth [name] Medical Record #[id number] Date of Birth: [birth year]  Referring Dr. Irish Lack  Primary Care: Rodena Medin, MD  Chief Complaint:    Chief Complaint  Patient presents with  . Shortness of Breath  Patient examined, coronary angiogram and 2-D echocardiogram personally reviewed  History of Present Illness:     60 year old Asian male was admitted through the emergency department after presenting to the urgent care center with shortness of breath and low oxygen saturation requiring intubation. Urgent cardiac catheterization was performed because of troponin 1.4 consistent with non-ST elevation MI. The patient found has severe tight 3 vessel coronary disease with moderate to severe LV dysfunction. Echocardiogram showed no significant mitral regurgitation. A large left pleural effusion was noted. Following cardiac catheterization a balloon pump was placed  Cardiac output 3.4 L/m, CVP 12, LVEDP 23, PA pressure 37/15 The patient remains intubated and sedated Urine output has been adequate with Lasix Patient initially was with metabolic acidosis but this is been corrected with bicarbonate and better perfusion. The patient been placed on empiric antibiotics for possible pneumonia.  The patient has known chronic kidney disease baseline creatinine 2.5. The patient is a diabetic with an insulin pump.   Current Activity/ Functional Status: Unknown   Zubrod Score: At the time of surgery this patient's most appropriate activity status/level should be described as: '[]'$     0    Normal activity, no symptoms '[]'$     1    Restricted in physical strenuous activity but ambulatory, able to do out light work '[]'$     2    Ambulatory and capable of self care, unable to do work activities, up and about                 more than 50%  Of the time                            '[]'$     3    Only limited self  care, in bed greater than 50% of waking hours '[x]'$     4    Completely disabled, no self care, confined to bed or chair '[]'$     5    Moribund  No past medical history on file.  Past Surgical History:  Procedure Laterality Date  . CARDIAC CATHETERIZATION N/A 05/31/2016   Procedure: Left Heart Cath and Coronary Angiography;  Surgeon: Jettie Booze, MD;  Location: [facility];  Service: Cardiovascular;  Laterality: N/A;  . CARDIAC CATHETERIZATION N/A 05/31/2016   Procedure: Right Heart Cath;  Surgeon: Jettie Booze, MD;  Location: [facility];  Service: Cardiovascular;  Laterality: N/A;  . CARDIAC CATHETERIZATION N/A 05/31/2016   Procedure: IABP Insertion;  Surgeon: Jettie Booze, MD;  Location: [facility];  Service: Cardiovascular;  Laterality: N/A;    History  Smoking Status  . Former Smoker  Smokeless Tobacco  . Never Used    History  Alcohol use Not on file    Social History   Social History  . Marital status: Unknown    Spouse name: N/A  . Number of children: N/A  . Years of education: N/A   Occupational History  . Not on file.  Social History Main Topics  . Smoking status: Former Research scientist (life sciences)  . Smokeless tobacco: Never Used  . Alcohol use Not on file  . Drug use: Unknown  . Sexual activity: Not on file   Other Topics Concern  . Not on file   Social History Narrative  . No narrative on file    No Known Allergies  Current Facility-Administered Medications  Medication Dose Route Frequency Provider Last Rate Last Dose  . 0.9 %  sodium chloride infusion  250 mL Intravenous PRN Raylene Miyamoto, MD 10 mL/hr at 06/01/16 0800 250 mL at 06/01/16 0800  . 0.9 %  sodium chloride infusion   Intra-arterial PRN Ivin Poot, MD      . acetaminophen (TYLENOL) solution 650 mg  650 mg Oral Q6H PRN Chesley Mires, MD      . aspirin chewable tablet 81 mg  81 mg Oral Daily Jettie Booze, MD   81 mg at 06/01/16 1029  . atorvastatin  (LIPITOR) tablet 80 mg  80 mg Oral q1800 Larey Dresser, MD      . chlorhexidine gluconate (MEDLINE KIT) (PERIDEX) 0.12 % solution 15 mL  15 mL Mouth Rinse BID Raylene Miyamoto, MD   15 mL at 06/01/16 0800  . feeding supplement (VITAL AF 1.2 CAL) liquid 1,000 mL  1,000 mL Per Tube Continuous Raylene Miyamoto, MD 50 mL/hr at 06/01/16 1242 1,000 mL at 06/01/16 1242  . fentaNYL (SUBLIMAZE) 2,500 mcg in sodium chloride 0.9 % 250 mL (10 mcg/mL) infusion  25-400 mcg/hr Intravenous Continuous Tammy S Parrett, NP 15 mL/hr at 06/01/16 0800 150 mcg/hr at 06/01/16 0800  . fentaNYL (SUBLIMAZE) bolus via infusion 50 mcg  50 mcg Intravenous Q1H PRN Tammy S Parrett, NP      . furosemide (LASIX) injection 60 mg  60 mg Intravenous Q12H Raylene Miyamoto, MD   60 mg at 06/01/16 0555  . heparin ADULT infusion 100 units/mL (25000 units/242m sodium chloride 0.45%)  750 Units/hr Intravenous Continuous JPriscella Mann RPH 7.5 mL/hr at 06/01/16 0800 750 Units/hr at 06/01/16 0800  . hydrALAZINE (APRESOLINE) tablet 12.5 mg  12.5 mg Oral Q8H DLarey Dresser MD   12.5 mg at 06/01/16 1243  . insulin aspart (novoLOG) injection 2-6 Units  2-6 Units Subcutaneous Q4H DRaylene Miyamoto MD   4 Units at 06/01/16 1206  . insulin glargine (LANTUS) injection 10 Units  10 Units Subcutaneous Daily DRaylene Miyamoto MD   10 Units at 06/01/16 1028  . isosorbide dinitrate (ISORDIL) tablet 5 mg  5 mg Oral TID DLarey Dresser MD   5 mg at 06/01/16 1243  . MEDLINE mouth rinse  15 mL Mouth Rinse 10 times per day DRaylene Miyamoto MD   15 mL at 06/01/16 1600  . ondansetron (ZOFRAN) injection 4 mg  4 mg Intravenous Q6H PRN JJettie Booze MD      . pantoprazole sodium (PROTONIX) 40 mg/20 mL oral suspension 40 mg  40 mg Per Tube Q24H VChesley Mires MD      . propofol (DIPRIVAN) 1000 MG/100ML infusion  0-50 mcg/kg/min Intravenous Continuous DRaylene Miyamoto MD 4.4 mL/hr at 06/01/16 0800 10 mcg/kg/min at 06/01/16 0800  . sodium  chloride flush (NS) 0.9 % injection 10-40 mL  10-40 mL Intracatheter Q12H DRaylene Miyamoto MD   10 mL at 06/01/16 1000  . sodium chloride flush (NS) 0.9 % injection 10-40 mL  10-40 mL Intracatheter PRN DRaylene Miyamoto MD  Prescriptions Prior to Admission  Medication Sig Dispense Refill Last Dose  . albuterol (PROVENTIL HFA;VENTOLIN HFA) 108 (90 Base) MCG/ACT inhaler Inhale 2 puffs into the lungs every 6 (six) hours as needed for wheezing.   unknown  . atorvastatin (LIPITOR) 40 MG tablet Take 40 mg by mouth at bedtime.   0 05/30/2016 at Unknown time  . doxycycline (VIBRA-TABS) 100 MG tablet Take 100 mg by mouth 2 (two) times daily. 7 day course filled 05/25/16  0 05/30/2016 at Unknown time  . gabapentin (NEURONTIN) 300 MG capsule Take 1 capsule (300 mg total) by mouth 3 (three) times daily. (Patient taking differently: Take 900 mg by mouth at bedtime. ) 90 capsule 2 05/30/2016 at Unknown time  . ibuprofen (ADVIL,MOTRIN) 200 MG tablet Take 400 mg by mouth every 6 (six) hours as needed (pain).   week ago  . Insulin Human (INSULIN PUMP) SOLN Inject into the skin continuous. Novolog   05/31/2016 at Unknown time  . losartan-hydrochlorothiazide (HYZAAR) 50-12.5 MG tablet Take 1 tablet by mouth at bedtime.   2 05/30/2016 at Unknown time  . Multiple Vitamin (MULTIVITAMIN WITH MINERALS) TABS tablet Take 1 tablet by mouth at bedtime.   05/30/2016 at Unknown time  . lidocaine (LIDODERM) 5 % Place 1 patch onto the skin daily. Remove & Discard patch within 12 hours or as directed by MD (Patient not taking: Reported on 05/31/2016) 10 patch 2 Not Taking at Unknown time    No family history on file.   Review of Systems:  Review of systems is not possible to obtain because the patient is intubated. No family members are present on the morning of consultation     Cardiac Review of Systems: Y or N  Chest Pain [    ]  Resting SOB [   ] Exertional SOB  [  ]  Orthopnea [  ]   Pedal Edema [   ]    Palpitations  [  ] Syncope  [  ]   Presyncope [   ]  General Review of Systems: [Y] = yes [  ]=no Constitional: recent weight change [  ]; anorexia [  ]; fatigue [  ]; nausea [  ]; night sweats [  ]; fever [  ]; or chills [  ]                                                               Dental: poor dentition[  ]; Last Dentist visit:   Eye : blurred vision [  ]; diplopia [   ]; vision changes [  ];  Amaurosis fugax[  ]; Resp: cough [  ];  wheezing[  ];  hemoptysis[  ]; shortness of breath[  ]; paroxysmal nocturnal dyspnea[  ]; dyspnea on exertion[  ]; or orthopnea[  ];  GI:  gallstones[  ], vomiting[  ];  dysphagia[  ]; melena[  ];  hematochezia [  ]; heartburn[  ];   Hx of  Colonoscopy[  ]; GU: kidney stones [  ]; hematuria[  ];   dysuria [  ];  nocturia[  ];  history of     obstruction [  ]; urinary frequency [  ]             Skin:  rash, swelling[  ];, hair loss[  ];  peripheral edema[  ];  or itching[  ]; Musculosketetal: myalgias[  ];  joint swelling[  ];  joint erythema[  ];  joint pain[  ];  back pain[  ];  Heme/Lymph: bruising[  ];  bleeding[  ];  anemia[  ];  Neuro: TIA[  ];  headaches[  ];  stroke[  ];  vertigo[  ];  seizures[  ];   paresthesias[  ];  difficulty walking[  ];  Psych:depression[  ]; anxiety[  ];  Endocrine: diabetes[  ];  thyroid dysfunction[  ];  Immunizations: Flu [  ]; Pneumococcal[  ];  Other:  Physical Exam: BP (!) 166/49   Pulse 65   Temp 99.9 F (37.7 C)   Resp (!) 26   Ht '5\' 6"'$  (1.676 m)   Wt 159 lb 6.3 oz (72.3 kg)   SpO2 100%   BMI 25.73 kg/m        Physical Exam  General: Middle-aged Asian male well-developed and abated and sedated HEENT: Normocephalic pupils equal , dentition adequate Neck: Supple without JVD, adenopathy, or bruit Chest: Coarse breath sounds bilaterally, diminished breath sounds left base Cardiovascular: Regular rate and rhythm, no murmur, no gallop, peripheral pulses             palpable in all extremities Abdomen:  Soft, nontender, no  palpable mass or organomegaly Extremities: Warm, well-perfused, no clubbing or cyanosis, 2+ edema of the lower extremities  without tenderness,              no venous stasis changes of the legs Rectal/GU: Deferred Neuro: Grossly non--focal and symmetrical throughout Skin: Clean and dry without rash or ulceration   Diagnostic Studies & Laboratory data:     Recent Radiology Findings:   Dg Chest Port 1 View  Result Date: 06/01/2016 CLINICAL DATA:  Intra-aortic balloon pump. EXAM: PORTABLE CHEST 1 VIEW COMPARISON:  05/31/2016 . FINDINGS: Endotracheal tube, NG tube, Swan-Ganz catheter, intra aortic balloon pump marker in stable position. Diffuse bilateral pulmonary infiltrates/edema again noted. Bilateral pleural effusions again noted. Persistent cardiomegaly. No pneumothorax IMPRESSION: 1. Lines and tubes including aortic balloon pump in stable position. 2. Cardiomegaly with diffuse bilateral pulmonary infiltrates and bilateral pleural effusions consistent with CHF again noted. No significant change . Electronically Signed   By: Marcello Moores  Register   On: 06/01/2016 07:41   Dg Chest Port 1 View  Result Date: 06/01/2016 CLINICAL DATA:  On intra-aortic balloon pump assist EXAM: PORTABLE CHEST 1 VIEW COMPARISON:  05/31/2016 FINDINGS: Endotracheal tube with tip measuring 8.2 cm above the carina. Enteric tube tip is off the field of view but below the left hemidiaphragm. Right Swan-Ganz catheter is been placed with tip over the right hilum. No pneumothorax. Bilateral pleural effusions. Bilateral perihilar infiltrates likely to represent edema. Entry aortic balloon pump marker projects over the aortic arch. Aortic calcification. Mild cardiac enlargement. IMPRESSION: Appliances appear in satisfactory position. Bilateral pleural effusions and perihilar edema similar to prior study. Electronically Signed   By: Lucienne Capers M.D.   On: 06/01/2016 00:15   Dg Chest Port 1 View  Result Date: 05/31/2016 CLINICAL  DATA:  Pulmonary edema, orogastric tube EXAM: PORTABLE CHEST 1 VIEW COMPARISON:  Portable exam 1757 hours compared to 1523 hours FINDINGS: Tip of endotracheal tube projects 7.2 cm above carina. Orogastric tube extends into stomach. Enlargement of cardiac silhouette with vascular congestion. Perihilar infiltrates likely representing pulmonary edema, slightly improved since previous exam. Bibasilar effusions and atelectasis ;  unable to exclude consolidation in LEFT lower lobe were more dense opacification is identified. No pneumothorax. Bones demineralized. IMPRESSION: Slightly improved pulmonary edema. Bibasilar pleural effusions atelectasis, unable to exclude consolidation in LEFT lower lobe. Electronically Signed   By: Lavonia Dana M.D.   On: 05/31/2016 18:19   Dg Chest Portable 1 View  Result Date: 05/31/2016 CLINICAL DATA:  Chest pain.  Post intubation. EXAM: PORTABLE CHEST 1 VIEW COMPARISON:  05/31/2016 FINDINGS: Interval placement of a nasogastric tube which courses into the region of the stomach as tip is not visualized. Interval placement of endotracheal tube with tip 7.5 cm above the carina. Lungs are adequately inflated demonstrate persistent bilateral perihilar bibasilar opacification likely due to interstitial edema with moderate bilateral pleural effusions and bibasilar atelectasis. Cannot exclude infection. Mild stable cardiomegaly. Remainder the exam is unchanged. IMPRESSION: Constellation of findings likely representing moderate interstitial edema/ CHF with moderate bilateral effusions/bibasilar atelectasis. Cannot exclude infection in the mid to lower lungs. Tubes and lines as described. Electronically Signed   By: Marin Olp M.D.   On: 05/31/2016 15:59   Dg Chest Port 1 View  Result Date: 05/31/2016 CLINICAL DATA:  Patient with chest pain. EXAM: PORTABLE CHEST 1 VIEW COMPARISON:  None. FINDINGS: Patient is rotated to the right. Cardiomegaly. Bilateral perihilar and lower lung interstitial  opacities and consolidation. Moderate bilateral pleural effusions. Biapical pleural parenchymal thickening. No pneumothorax. IMPRESSION: Cardiomegaly. Bilateral perihilar and lower lung interstitial opacities and consolidation favored to represent edema. Infection could have a similar appearance. Moderate bilateral pleural effusions. Electronically Signed   By: Lovey Newcomer M.D.   On: 05/31/2016 15:23   Dg Abd Portable 1v  Result Date: 06/01/2016 CLINICAL DATA:  OG tube placement EXAM: PORTABLE ABDOMEN - 1 VIEW COMPARISON:  None. FINDINGS: OG tube tip is in the mid stomach. Nonobstructive bowel gas pattern. IMPRESSION: OG tube tip in the mid stomach. Electronically Signed   By: Rolm Baptise M.D.   On: 06/01/2016 07:40     I have independently reviewed the above radiologic studies.  Recent Lab Findings: Lab Results  Component Value Date   WBC 8.5 06/01/2016   HGB 8.3 (L) 06/01/2016   HCT 25.6 (L) 06/01/2016   PLT 236 06/01/2016   GLUCOSE 172 (H) 06/01/2016   TRIG 195 (H) 05/31/2016   ALT 97 (H) 05/31/2016   AST 53 (H) 05/31/2016   NA 137 06/01/2016   K 4.9 06/01/2016   CL 112 (H) 06/01/2016   CREATININE 2.66 (H) 06/01/2016   BUN 80 (H) 06/01/2016   CO2 18 (L) 06/01/2016   INR 0.96 05/31/2016      Assessment / Plan:      Ischemic cardiomyopathy, non-ST elevation MI, cardiogenic shock requiring balloon pump  Acute respiratory failure from pulmonary edema requiring intubation  Chronic renal insufficiency from diabetes, baseline creatinine 2.5   Plan Patient needs to be stabilized and lungs diuresis prior to multivessel CABG. Keep balloon pump in place until surgery. With diuresis patient may be able to be extubated. He may need left thoracentesis. It is critical his renal function be maintained and optimized prior to urgent CABG in order to reduce the risk for permanent hemodialysis.   Anticipate patient should be ready for surgery later this week. We'll  follow.     '@ME1'$ @ 06/01/2016 5:05 PM

## 2016-06-01 NOTE — Progress Notes (Signed)
Initial Nutrition Assessment  DOCUMENTATION CODES:   Not applicable  INTERVENTION:   Initiate TF via OGT with Vital AF 1.2 at goal rate of 60 ml/h (1440 ml per day) to provide 1786 kcals, 108 gm protein, 1168 ml free water daily.  TF regimen will meet 96% of estimated kcal needs and 100% of estimated protein needs.   NUTRITION DIAGNOSIS:   Inadequate oral intake related to inability to eat as evidenced by NPO status.  GOAL:   Patient will meet greater than or equal to 90% of their needs  MONITOR:   Vent status, Labs, Weight trends, TF tolerance, Skin, I & O's  REASON FOR ASSESSMENT:   Consult, Ventilator Enteral/tube feeding initiation and management  ASSESSMENT:   60 year old man with a history of poorly controlled diabetes, hypertension and hyperlipidemia. Over the past few weeks, he had not been feeling well. He thought he got the flu. He was seen at an urgent care in [facility] and was treated for bronchitis. Over the past several days, he has felt worse. He has been unable to lie flat. He had chest discomfort when lying flat. Both shortness of breath and chest discomfort were better when sitting forward.  He also noted lower extremity edema.  Pt admitted with acute respiratory failure and pulmonary edema.   Patient is currently intubated on ventilator support. OGT in place (per KUB, tip noted in the mid stomach).  MV: 13.0 L/min Temp (24hrs), Avg:97.6 F (36.4 C), Min:95.5 F (35.3 C), Max:99.3 F (37.4 C)  Propofol: n/a  RD received consult for tube feeding initiation and management. Adult Tube Feeding Protocol already entered. Pt currently receiving Vital High Protein @ 40 ml/hr via OGT with 30 ml Prostat BID. Regimen providing 1160 kcals, 114 grams protein, and 803 ml fluid daily (currently meeting 62% of estimated kcal needs and 100% of estimated protein needs).   Nutrition-Focused physical exam completed. Findings are no fat depletion, no muscle depletion,  and mild edema.   Reviewed DM coordinator note; pt using insulin pump PTA. Noted latest Hgb A1c 11.8 (05/15/16). Current order for inpatient glycemic control are Lantus 10 units and Novolog 2-4 units q 4 hrs. Blood sugars trending down.   Case discussed with RN; pt tolerating TF well. Pt with balloon pump.   Labs reviewed.   Diet Order:   NPO  Skin:  Reviewed, no issues  Last BM:  PTA  Height:   Ht Readings from Last 1 Encounters:  05/31/16 5\' 6"  (1.676 m)    Weight:   Wt Readings from Last 1 Encounters:  06/01/16 159 lb 6.3 oz (72.3 kg)    Ideal Body Weight:  64.5 kg  BMI:  Body mass index is 25.73 kg/m.  Estimated Nutritional Needs:   Kcal:  1866.6  Protein:  95-110 grams  Fluid:  1.8-2.0 L  EDUCATION NEEDS:   No education needs identified at this time  Kristiann Noyce A. Jimmye Norman, RD, LDN, CDE Pager: [phone] After hours Pager: [phone]

## 2016-06-01 NOTE — Progress Notes (Signed)
Notified CCM regarding increased HR 145.  EKG performed.  Rhythm now Rapid Afib 130s-140s.   SBP 132/92. No new orders given.  Will notify Cardiology of EKG change.

## 2016-06-01 NOTE — Progress Notes (Signed)
[facility] Suanne Marker, [place]) at bedside assessing EKG rhythm.  Orders given and initiated.  Will continue to monitor for changes

## 2016-06-01 NOTE — Progress Notes (Signed)
Inpatient Diabetes Program Recommendations  AACE/ADA: New Consensus Statement on Inpatient Glycemic Control (2015)  Target Ranges:  Prepandial:   less than 140 mg/dL      Peak postprandial:   less than 180 mg/dL (1-2 hours)      Critically ill patients:  140 - 180 mg/dL   Review of Glycemic Control Results for KENDRELL, LOTTMAN (MRN [id number]) as of 06/01/2016 09:15  Ref. Range 05/31/2016 16:57 05/31/2016 21:51 05/31/2016 23:50 06/01/2016 04:23 06/01/2016 07:50  Glucose-Capillary Latest Ref Range: 65 - 99 mg/dL 308 (H) 134 (H) 104 (H) 168 (H) 68   Diabetes history: DM2 (per [facility] note) Outpatient Diabetes medications: Insulin pump Current orders for Inpatient glycemic control: Lantus 10 units + Novolog 2-4 units q 4 hrs.   Inpatient Diabetes Program Recommendations:    A1c @ [place] was 11.8 on 05/15/16 and note states patient is type 2. Will follow patient in hospital. Will attempt to find insulin pump settings in insulin pump available.  Thank you, Nani Gasser. Sierra Bissonette, RN, MSN, CDE Inpatient Glycemic Control Team Team Pager [phone] (8am-5pm) 06/01/2016 9:27 AM

## 2016-06-01 NOTE — Procedures (Signed)
Arterial Catheter Insertion Procedure Note Kenneth [name] [id number] [birth year]  Procedure: Insertion of Arterial Catheter  Indications: Blood pressure monitoring and Frequent blood sampling  Procedure Details Consent: Unable to obtain consent due to pt. Being intubated on ventilator. Only one person in pt.'s family speaks english & they aren't here at this time. Time Out: Verified patient identification, verified procedure, site/side was marked, verified correct patient position, special equipment/implants available, medications/allergies/relevent history reviewed, required imaging and test results available.  Performed  Maximum sterile technique was used including cap, gloves, gown, hand hygiene, mask and sheet. Skin prep: Chlorhexidine; local anesthetic administered 22 gauge catheter was inserted into left radial artery using the Seldinger technique.  Evaluation Blood flow good; BP tracing good. Complications: No apparent complications.   Claretta Fraise 06/01/2016

## 2016-06-01 NOTE — Progress Notes (Signed)
Advanced Heart Failure Rounding Note  Referring PhysicianDaniel Lily Kocher, MD  Primary Physician: Rodena Medin, MD Primary Cardiologist: New (Dr. Irish Lack initially consulted) Primary HF: New ( Dr Aundra Dubin)  Reason for Consultation: Acute respiratory failure  Subjective:    Kenneth [name] is a 60 y.o. male with hx of poorly controlled diabetes, HTN, CKD IV, and HLD.  Admitted 05/31/16 with orthopnea, DOE, and CP. EKG did not meet stemi criteria.  Troponin 1.4. Echo showed global LV dysfunction. Started on NIMV for acute respiratory failure, failed requiring intubation.   Echo 05/31/16 LVEF35-40%, Grade 2 DD  LHC 05/31/16 showed LM lesion, 40 %stenosed, Ramus lesion, 80 %stenosed, Ost Cx to Prox Cx lesion, 80 %stenosed, Mid LAD lesion, 90 %stenosed, Ost 2nd Diag to 2nd Diag lesion, 90 %stenosed, Prox RCA to Mid RCA lesion, 100 %stenosed.  IABP inserted. Cardiac surgery consulted, as well as CHF team.   Remains intubated/sedated and on IABP at 1:1 support this am.   Coox pending. Out 1.1 L and down 3 lbs so far.   Swan numbers 06/01/16 0832 PAP 25/22 (24) CVP 5 PCWP 21 CO 5.7 CI 3.1 SVR 965  Objective:   Weight Range: 159 lb 6.3 oz (72.3 kg) Body mass index is 25.73 kg/m.   Vital Signs:   Temp:  [95.5 F (35.3 C)-99.3 F (37.4 C)] 99.1 F (37.3 C) (09/18 0900) Pulse Rate:  [46-109] 71 (09/18 0900) Resp:  [13-40] 23 (09/18 0900) BP: (86-194)/(44-121) 130/58 (09/18 0900) SpO2:  [90 %-100 %] 100 % (09/18 0900) FiO2 (%):  [50 %-100 %] 60 % (09/18 0400) Weight:  [140 lb (63.5 kg)-162 lb 14.7 oz (73.9 kg)] 159 lb 6.3 oz (72.3 kg) (09/18 0400)    Weight change: Filed Weights   05/31/16 1445 05/31/16 2100 06/01/16 0400  Weight: 140 lb (63.5 kg) 162 lb 14.7 oz (73.9 kg) 159 lb 6.3 oz (72.3 kg)    Intake/Output:   Intake/Output Summary (Last 24 hours) at 06/01/16 0913 Last data filed at 06/01/16 0700  Gross per 24 hour  Intake           807.99 ml  Output              2020 ml  Net         -1212.01 ml     Physical Exam: CVP 5-6 General:  Intubated and sedated HEENT: ET tube + [place] Neck: supple. JVP 5-6. Carotids 2+ bilat; no bruits. No thyromegaly or nodule noted.  Cor: PMI nondisplaced. Regular rate & rhythm. No rubs, gallops or murmurs. Lungs: Mechanical breathing sounds.  Abdomen: soft, ND, no HSM. No bruits or masses. +BS  Extremities: no cyanosis, clubbing, rash. 1-2+ edema. LEs cold to the touch. R groin IABP stable.  Neuro: alert & orientedx3, cranial nerves grossly intact. moves all 4 extremities w/o difficulty. Affect pleasant  Telemetry: Reviewed, NSR  Labs: CBC  Recent Labs  05/31/16 1435 06/01/16 0438  WBC 10.3 8.5  HGB 10.1* 8.3*  HCT 30.7* 25.6*  MCV 90.6 90.1  PLT 326 595   Basic Metabolic Panel  Recent Labs  05/31/16 1824 06/01/16 0439  NA 135 137  K 5.8* 4.9  CL 110 112*  CO2 16* 18*  GLUCOSE 286* 172*  BUN 76* 80*  CREATININE 2.84* 2.66*  CALCIUM 8.2* 8.4*  MG 1.9 1.9  PHOS 4.7* 5.1*   Liver Function Tests  Recent Labs  05/31/16 1824  AST 53*  ALT 97*  ALKPHOS 115  BILITOT  0.6  PROT 5.2*  ALBUMIN 2.6*   No results for input(s): LIPASE, AMYLASE in the last 72 hours. Cardiac Enzymes  Recent Labs  05/31/16 1824 05/31/16 2311 06/01/16 0439  TROPONINI 1.68* 2.46* 2.90*    BNP: BNP (last 3 results)  Recent Labs  05/31/16 1435  BNP 2,537.9*    ProBNP (last 3 results) No results for input(s): PROBNP in the last 8760 hours.   D-Dimer No results for input(s): DDIMER in the last 72 hours. Hemoglobin A1C No results for input(s): HGBA1C in the last 72 hours. Fasting Lipid Panel  Recent Labs  05/31/16 1824  TRIG 195*   Thyroid Function Tests No results for input(s): TSH, T4TOTAL, T3FREE, THYROIDAB in the last 72 hours.  Invalid input(s): FREET3  Other results:     Imaging/Studies:  Dg Chest Port 1 View  Result Date: 06/01/2016 CLINICAL DATA:  Intra-aortic balloon  pump. EXAM: PORTABLE CHEST 1 VIEW COMPARISON:  05/31/2016 . FINDINGS: Endotracheal tube, NG tube, Swan-Ganz catheter, intra aortic balloon pump marker in stable position. Diffuse bilateral pulmonary infiltrates/edema again noted. Bilateral pleural effusions again noted. Persistent cardiomegaly. No pneumothorax IMPRESSION: 1. Lines and tubes including aortic balloon pump in stable position. 2. Cardiomegaly with diffuse bilateral pulmonary infiltrates and bilateral pleural effusions consistent with CHF again noted. No significant change . Electronically Signed   By: Marcello Moores  Register   On: 06/01/2016 07:41   Dg Chest Port 1 View  Result Date: 06/01/2016 CLINICAL DATA:  On intra-aortic balloon pump assist EXAM: PORTABLE CHEST 1 VIEW COMPARISON:  05/31/2016 FINDINGS: Endotracheal tube with tip measuring 8.2 cm above the carina. Enteric tube tip is off the field of view but below the left hemidiaphragm. Right Swan-Ganz catheter is been placed with tip over the right hilum. No pneumothorax. Bilateral pleural effusions. Bilateral perihilar infiltrates likely to represent edema. Entry aortic balloon pump marker projects over the aortic arch. Aortic calcification. Mild cardiac enlargement. IMPRESSION: Appliances appear in satisfactory position. Bilateral pleural effusions and perihilar edema similar to prior study. Electronically Signed   By: Lucienne Capers M.D.   On: 06/01/2016 00:15   Dg Chest Port 1 View  Result Date: 05/31/2016 CLINICAL DATA:  Pulmonary edema, orogastric tube EXAM: PORTABLE CHEST 1 VIEW COMPARISON:  Portable exam 1757 hours compared to 1523 hours FINDINGS: Tip of endotracheal tube projects 7.2 cm above carina. Orogastric tube extends into stomach. Enlargement of cardiac silhouette with vascular congestion. Perihilar infiltrates likely representing pulmonary edema, slightly improved since previous exam. Bibasilar effusions and atelectasis ; unable to exclude consolidation in LEFT lower lobe were  more dense opacification is identified. No pneumothorax. Bones demineralized. IMPRESSION: Slightly improved pulmonary edema. Bibasilar pleural effusions atelectasis, unable to exclude consolidation in LEFT lower lobe. Electronically Signed   By: Lavonia Dana M.D.   On: 05/31/2016 18:19   Dg Chest Portable 1 View  Result Date: 05/31/2016 CLINICAL DATA:  Chest pain.  Post intubation. EXAM: PORTABLE CHEST 1 VIEW COMPARISON:  05/31/2016 FINDINGS: Interval placement of a nasogastric tube which courses into the region of the stomach as tip is not visualized. Interval placement of endotracheal tube with tip 7.5 cm above the carina. Lungs are adequately inflated demonstrate persistent bilateral perihilar bibasilar opacification likely due to interstitial edema with moderate bilateral pleural effusions and bibasilar atelectasis. Cannot exclude infection. Mild stable cardiomegaly. Remainder the exam is unchanged. IMPRESSION: Constellation of findings likely representing moderate interstitial edema/ CHF with moderate bilateral effusions/bibasilar atelectasis. Cannot exclude infection in the mid to lower lungs. Tubes and  lines as described. Electronically Signed   By: Marin Olp M.D.   On: 05/31/2016 15:59   Dg Chest Port 1 View  Result Date: 05/31/2016 CLINICAL DATA:  Patient with chest pain. EXAM: PORTABLE CHEST 1 VIEW COMPARISON:  None. FINDINGS: Patient is rotated to the right. Cardiomegaly. Bilateral perihilar and lower lung interstitial opacities and consolidation. Moderate bilateral pleural effusions. Biapical pleural parenchymal thickening. No pneumothorax. IMPRESSION: Cardiomegaly. Bilateral perihilar and lower lung interstitial opacities and consolidation favored to represent edema. Infection could have a similar appearance. Moderate bilateral pleural effusions. Electronically Signed   By: Lovey Newcomer M.D.   On: 05/31/2016 15:23   Dg Abd Portable 1v  Result Date: 06/01/2016 CLINICAL DATA:  OG tube  placement EXAM: PORTABLE ABDOMEN - 1 VIEW COMPARISON:  None. FINDINGS: OG tube tip is in the mid stomach. Nonobstructive bowel gas pattern. IMPRESSION: OG tube tip in the mid stomach. Electronically Signed   By: Rolm Baptise M.D.   On: 06/01/2016 07:40     Latest Echo  Latest Cath   Medications:     Scheduled Medications: . aspirin  81 mg Oral Daily  . chlorhexidine gluconate (MEDLINE KIT)  15 mL Mouth Rinse BID  . dextrose      . famotidine (PEPCID) IV  20 mg Intravenous Q12H  . feeding supplement (PRO-STAT SUGAR FREE 64)  30 mL Per Tube BID  . feeding supplement (VITAL HIGH PROTEIN)  1,000 mL Per Tube Q24H  . fentaNYL (SUBLIMAZE) injection  50 mcg Intravenous Once  . furosemide  60 mg Intravenous Q12H  . insulin aspart  2-6 Units Subcutaneous Q4H  . insulin glargine  10 Units Subcutaneous Daily  . mouth rinse  15 mL Mouth Rinse 10 times per day  . sodium chloride flush  3 mL Intravenous Q12H     Infusions: . fentaNYL infusion INTRAVENOUS 150 mcg/hr (06/01/16 0640)  . heparin 750 Units/hr (05/31/16 1951)  . propofol (DIPRIVAN) infusion 10 mcg/kg/min (06/01/16 0435)     PRN Medications:  sodium chloride, sodium chloride, sodium chloride, acetaminophen, fentaNYL, ondansetron (ZOFRAN) IV, sodium chloride flush   Assessment/Plan   EATHAN GROMAN is a 60 y.o. male with hx of poorly controlled diabetes, HTN, CKD IV, and HLD.  Admitted 05/31/16 with orthopnea, DOE, and CP.  Intubated, sedated, placed on IABP.  HF team consulted for optimization. Cardiac surgery consulted as well with 3 vessel disease.    1. Acute on chronic combined CHF - Ischemic cardiomyopathy.  Echo 05/31/16 LVEF35-40%, Grade 2 DD - Remains on IABP 1:1. CO stable today. Not on any pressors.  - Volume appears slightly up. Would continue IV lasix 60 mg BID.  - No BB for now with shock - Was on losartan-HCTZ at home. Not great candidate for Entresto with CKD IV.  2. Acute respiratory failure - Intubated sedated  per CCM 3. CAD with 3 vessel disease - by [facility] 05/31/16 - Needs cardiac surgery consult. Will likely need CABG.  4. CKD stage IV - Continue to follow closely with diuresis. - Would not be good candidate for advanced therapies such as VAD.   5. DM2 - Poorly controlled - Per primary 6. HLD - Continue atorvastatin   Length of Stay: 1  Shirley Friar PA-C 06/01/2016, 9:13 AM  Advanced Heart Failure Team Pager [phone] (M-F; 7a - 4p)  Please contact [facility] for night-coverage after hours (4p -7a ) and weekends on [email or web address]  Patient seen with PA, agree with the above note.  1. Acute on chronic systolic CHF: EF 63-81% with wall motion abnormalities, ischemic cardiomyopathy.  By Luiz Blare, numbers don't look too bad, but CXR still shows significant edema.  Creatinine stable so far with diuresis.  Cardiac output looks good with IABP in.  Remains intubated.  - Keep IABP in place.  - Can add hydralazine 12.5 tid + Imdur 15 with SBP 130s.   - Lasix 60 mg IV bid today, has been diuresing well.  2. CAD: Admitted with NSTEMI, severe 3VD.  - ASA, atorvastatin 80.  - CVTS to consult re: CABG later this week.  3. CKD: Stage IV.  Creatinine has been stable here, 2.66 today.  We do not know his baseline but may be near it.  4. Acute respiratory failure: Pulmonary edema.  Suspect can get him extubated with a bit more diuresis.  5. Anemia: Right groin IABP site stable.  Follow closely.   45 minutes critical care time.   Loralie Champagne 06/01/2016 10:06 AM

## 2016-06-01 NOTE — Progress Notes (Signed)
Called by RN re: rapid atrial fib  Pt went into rapid atrial fib, rate up to 140. Still on balloon pump at 1:1, SBP approx 90 on pump, 120s on cuff  Amio bolus given, HR control improving, still in afib. Continue with amio drip, monitor carefully.   Lenoard Aden 06/01/2016 6:55 PM Beeper [phone]

## 2016-06-01 NOTE — Progress Notes (Signed)
[facility] Progress Note Patient Name: Kenneth [name] DOB: 07-28-1956 MRN: [id number]   Date of Service  06/01/2016  HPI/Events of Note  RN calls as patient went into  ? SVT or aflutter. 140s.  By the time I Camerad in, heart rate went slow to 100 - 120. Sinus rhythm.  160/40  20,  97% on 50%.  Being diuresced.  Mg and phos replaced.   eICU Interventions  EKG now. >> afib Check Mg, Phos, BMP.  Observe for now  Already on heparin drip.      Intervention Category Intermediate Interventions: Other:  Rush Landmark 06/01/2016, 5:33 PM

## 2016-06-01 NOTE — Progress Notes (Signed)
PULMONARY / CRITICAL CARE MEDICINE   Name: Kenneth [name] MRN: [id number] DOB: 10-28-1955    ADMISSION DATE:  05/31/2016 CONSULTATION DATE:  9/17   REFERRING MD:  Er md medctr  CHIEF COMPLAINT:  Sob, resp failure  HISTORY OF PRESENT ILLNESS:   60 yo male male presented with dyspnea felt to be related to respiratory infection.  He had progressive orthopnea, chest discomfort, lower extremity edema, and respiratory failure from acute pulmonary edema with acute combined CHF.  PMHx of DM, HTN, HLD, CKD 4  SUBJECTIVE:  IABP 1:1.  Full vent support.  VITAL SIGNS: BP (!) 130/58   Pulse 71   Temp 99.1 F (37.3 C)   Resp (!) 23   Ht 5\' 6"  (1.676 m)   Wt 159 lb 6.3 oz (72.3 kg)   SpO2 100%   BMI 25.73 kg/m   HEMODYNAMICS: PAP: (22-42)/(14-29) 25/21 CVP:  [3 mmHg-15 mmHg] 7 mmHg PCWP:  [16 mmHg-25 mmHg] 21 mmHg CO:  [4.3 L/min-5.7 L/min] 5.7 L/min CI:  [2.3 L/min/m2-3.1 L/min/m2] 3.1 L/min/m2  VENTILATOR SETTINGS: Vent Mode: PRVC FiO2 (%):  [50 %-100 %] 60 % Set Rate:  [20 bmp-26 bmp] 26 bmp Vt Set:  [500 mL] 500 mL PEEP:  [5 cmH20-8 cmH20] 5 cmH20 Plateau Pressure:  [20 cmH20-22 cmH20] 20 cmH20  INTAKE / OUTPUT: I/O last 3 completed shifts: In: 808 [I.V.:428.7; NG/GT:329.3; IV Piggyback:50] Out: 2020 [Urine:2020]  PHYSICAL EXAMINATION: General:  Sedated  Neuro: Opens eyes with stimulation, moves extremities HEENT:  ETT in place Cardiovascular: regular, no murmur Lungs: basilar crackles Abdomen:  Soft, non tender Musculoskeletal:  2+ edema Skin: cool, no rashes  LABS:  BMET  Recent Labs Lab 05/31/16 1435 05/31/16 1824 06/01/16 0439  NA 135 135 137  K 5.4* 5.8* 4.9  CL 110 110 112*  CO2 17* 16* 18*  BUN 77* 76* 80*  CREATININE 2.69* 2.84* 2.66*  GLUCOSE 314* 286* 172*    Electrolytes  Recent Labs Lab 05/31/16 1435 05/31/16 1824 06/01/16 0439  CALCIUM 8.6* 8.2* 8.4*  MG  --  1.9 1.9  PHOS  --  4.7* 5.1*    CBC  Recent Labs Lab  05/31/16 1435 06/01/16 0438  WBC 10.3 8.5  HGB 10.1* 8.3*  HCT 30.7* 25.6*  PLT 326 236    Coag's  Recent Labs Lab 05/31/16 1435  INR 0.96    Sepsis Markers  Recent Labs Lab 05/31/16 2345 06/01/16 0952  LATICACIDVEN 1.0 0.8    ABG  Recent Labs Lab 05/31/16 1755 06/01/16 0453 06/01/16 0902  PHART 7.301* 7.319* 7.392  PCO2ART 33.3 33.4 36.0  PO2ART 85.3 146.0* 70.0*    Liver Enzymes  Recent Labs Lab 05/31/16 1824  AST 53*  ALT 97*  ALKPHOS 115  BILITOT 0.6  ALBUMIN 2.6*    Cardiac Enzymes  Recent Labs Lab 05/31/16 1824 05/31/16 2311 06/01/16 0439  TROPONINI 1.68* 2.46* 2.90*    Glucose  Recent Labs Lab 05/31/16 1657 05/31/16 2151 05/31/16 2350 06/01/16 0423 06/01/16 0750 06/01/16 0934  GLUCAP 308* 134* 104* 168* 68 164*    Imaging Dg Chest Port 1 View  Result Date: 06/01/2016 CLINICAL DATA:  Intra-aortic balloon pump. EXAM: PORTABLE CHEST 1 VIEW COMPARISON:  05/31/2016 . FINDINGS: Endotracheal tube, NG tube, Swan-Ganz catheter, intra aortic balloon pump marker in stable position. Diffuse bilateral pulmonary infiltrates/edema again noted. Bilateral pleural effusions again noted. Persistent cardiomegaly. No pneumothorax IMPRESSION: 1. Lines and tubes including aortic balloon pump in stable position. 2. Cardiomegaly with diffuse  bilateral pulmonary infiltrates and bilateral pleural effusions consistent with CHF again noted. No significant change . Electronically Signed   By: Marcello Moores  Register   On: 06/01/2016 07:41   Dg Chest Port 1 View  Result Date: 06/01/2016 CLINICAL DATA:  On intra-aortic balloon pump assist EXAM: PORTABLE CHEST 1 VIEW COMPARISON:  05/31/2016 FINDINGS: Endotracheal tube with tip measuring 8.2 cm above the carina. Enteric tube tip is off the field of view but below the left hemidiaphragm. Right Swan-Ganz catheter is been placed with tip over the right hilum. No pneumothorax. Bilateral pleural effusions. Bilateral perihilar  infiltrates likely to represent edema. Entry aortic balloon pump marker projects over the aortic arch. Aortic calcification. Mild cardiac enlargement. IMPRESSION: Appliances appear in satisfactory position. Bilateral pleural effusions and perihilar edema similar to prior study. Electronically Signed   By: Lucienne Capers M.D.   On: 06/01/2016 00:15   Dg Chest Port 1 View  Result Date: 05/31/2016 CLINICAL DATA:  Pulmonary edema, orogastric tube EXAM: PORTABLE CHEST 1 VIEW COMPARISON:  Portable exam 1757 hours compared to 1523 hours FINDINGS: Tip of endotracheal tube projects 7.2 cm above carina. Orogastric tube extends into stomach. Enlargement of cardiac silhouette with vascular congestion. Perihilar infiltrates likely representing pulmonary edema, slightly improved since previous exam. Bibasilar effusions and atelectasis ; unable to exclude consolidation in LEFT lower lobe were more dense opacification is identified. No pneumothorax. Bones demineralized. IMPRESSION: Slightly improved pulmonary edema. Bibasilar pleural effusions atelectasis, unable to exclude consolidation in LEFT lower lobe. Electronically Signed   By: Lavonia Dana M.D.   On: 05/31/2016 18:19   Dg Chest Portable 1 View  Result Date: 05/31/2016 CLINICAL DATA:  Chest pain.  Post intubation. EXAM: PORTABLE CHEST 1 VIEW COMPARISON:  05/31/2016 FINDINGS: Interval placement of a nasogastric tube which courses into the region of the stomach as tip is not visualized. Interval placement of endotracheal tube with tip 7.5 cm above the carina. Lungs are adequately inflated demonstrate persistent bilateral perihilar bibasilar opacification likely due to interstitial edema with moderate bilateral pleural effusions and bibasilar atelectasis. Cannot exclude infection. Mild stable cardiomegaly. Remainder the exam is unchanged. IMPRESSION: Constellation of findings likely representing moderate interstitial edema/ CHF with moderate bilateral  effusions/bibasilar atelectasis. Cannot exclude infection in the mid to lower lungs. Tubes and lines as described. Electronically Signed   By: Marin Olp M.D.   On: 05/31/2016 15:59   Dg Chest Port 1 View  Result Date: 05/31/2016 CLINICAL DATA:  Patient with chest pain. EXAM: PORTABLE CHEST 1 VIEW COMPARISON:  None. FINDINGS: Patient is rotated to the right. Cardiomegaly. Bilateral perihilar and lower lung interstitial opacities and consolidation. Moderate bilateral pleural effusions. Biapical pleural parenchymal thickening. No pneumothorax. IMPRESSION: Cardiomegaly. Bilateral perihilar and lower lung interstitial opacities and consolidation favored to represent edema. Infection could have a similar appearance. Moderate bilateral pleural effusions. Electronically Signed   By: Lovey Newcomer M.D.   On: 05/31/2016 15:23   Dg Abd Portable 1v  Result Date: 06/01/2016 CLINICAL DATA:  OG tube placement EXAM: PORTABLE ABDOMEN - 1 VIEW COMPARISON:  None. FINDINGS: OG tube tip is in the mid stomach. Nonobstructive bowel gas pattern. IMPRESSION: OG tube tip in the mid stomach. Electronically Signed   By: Rolm Baptise M.D.   On: 06/01/2016 07:40     STUDIES:  Echo 9/17 >> EF 35 to 40%, grade 2 DD [facility] 9/17 >> severe 3 vessel disease  CULTURES:  ANTIBIOTICS:  SIGNIFICANT EVENTS: 9/17 from [facility] to [facility] [place], Cardiology consulted, IABP 9/18  TCTS consulted  LINES/TUBES: 9/17 ETT >>  9/17 IABP >> 9/17 Rt IJ Introducer >>   ASSESSMENT / PLAN:  PULMONARY A: Acute hypoxic respiratory failure 2nd to acute pulmonary edema. P:   Full vent support F/u CXR, ABG  CARDIOVASCULAR A:  Cardiogenic shock. Acute combined CHF. 3 vessel CAD. P:  Continue ASA, lipitor IABP, PA catheter per cardiology Heparin gtt per cardiology Hydralazine, lasix, isordil per cardiology TCTS planning for CABG later this week  RENAL A:   CKD 4 >> baseline creatinine 2.57 from 05/21/16. P:   Monitor renal fx, urine  outpt  GASTROINTESTINAL A:   Nutrition. P:   Tube feeds Protonix for SUP  HEMATOLOGIC A:   Anemia of critical illness, chronic disease. P:  F/u CBC  INFECTIOUS A:   No evidence infection. P:   Monitor clinically  ENDOCRINE A:   DM. P:   SSI with lantus  NEUROLOGIC A:   Acute metabolic encephalopathy. P:   RASS goal: -1  CC time 34 minutes.  Chesley Mires, MD [facility] Pulmonary/Critical Care 06/01/2016, 11:16 AM Pager:  [phone] After 3pm call: [phone]

## 2016-06-01 NOTE — Progress Notes (Signed)
ANTICOAGULATION f/u [place] for Heparin Indication: chest pain/ACS; now with IABP in place  No Known Allergies  Patient Measurements: Height: 5\' 6"  (167.6 cm) Weight: 162 lb 14.7 oz (73.9 kg) IBW/kg (Calculated) : 63.8 Heparin Dosing Weight: 63.5 kg  Vital Signs: Temp: 98.1 F (36.7 C) (09/18 0242) Temp Source: Core (Comment) (09/18 0000) BP: 123/51 (09/18 0200) Pulse Rate: 59 (09/18 0200)  Labs:  Recent Labs  05/31/16 1435 05/31/16 1824 05/31/16 2311 06/01/16 0200  HGB 10.1*  --   --   --   HCT 30.7*  --   --   --   PLT 326  --   --   --   LABPROT 12.8  --   --   --   INR 0.96  --   --   --   HEPARINUNFRC  --   --   --  0.31  CREATININE 2.69* 2.84*  --   --   TROPONINI 1.43* 1.68* 2.46*  --     Estimated Creatinine Clearance: 25.6 mL/min (by C-G formula based on SCr of 2.84 mg/dL (H)).   Medical History: No past medical history on file.  Assessment: 60 year old male at [facility] ED with ACS to start IV heparin.   Patient now s/p IABP placement in cath lab.  Heparin resumed at 750 units/hr at around 8 PM.  Heparin level 0.31 in goal range.  Goal of Therapy:  Heparin level 0.2-0.5 on IABP Monitor platelets by anticoagulation protocol: Yes   Plan:  1. Continue IV heparin at 750 units/hr 2. Daily heparin level and CBC. 3. F/u plans for CABG.  Franklin Baumbach S. Alford Highland, PharmD, [facility] Clinical Staff Pharmacist Pager [phone]   06/01/2016 2:56 AM

## 2016-06-02 ENCOUNTER — Inpatient Hospital Stay (HOSPITAL_COMMUNITY): Payer: BLUE CROSS/BLUE SHIELD

## 2016-06-02 DIAGNOSIS — J90 Pleural effusion, not elsewhere classified: Secondary | ICD-10-CM

## 2016-06-02 DIAGNOSIS — Z01810 Encounter for preprocedural cardiovascular examination: Secondary | ICD-10-CM

## 2016-06-02 LAB — BODY FLUID CELL COUNT WITH DIFFERENTIAL
Lymphs, Fluid: 29 %
MONOCYTE-MACROPHAGE-SEROUS FLUID: 44 % — AB (ref 50–90)
Neutrophil Count, Fluid: 27 % — ABNORMAL HIGH (ref 0–25)
WBC FLUID: 117 uL (ref 0–1000)

## 2016-06-02 LAB — BLOOD GAS, ARTERIAL
Acid-base deficit: 6.9 mmol/L — ABNORMAL HIGH (ref 0.0–2.0)
Bicarbonate: 18.3 mmol/L — ABNORMAL LOW (ref 20.0–28.0)
Drawn by: 437071
FIO2: 40
MECHVT: 500 mL
O2 Saturation: 94 %
PEEP: 5 cmH2O
Patient temperature: 98.6
RATE: 26 resp/min
pCO2 arterial: 38.4 mmHg (ref 32.0–48.0)
pH, Arterial: 7.299 — ABNORMAL LOW (ref 7.350–7.450)
pO2, Arterial: 80.5 mmHg — ABNORMAL LOW (ref 83.0–108.0)

## 2016-06-02 LAB — COMPREHENSIVE METABOLIC PANEL
ALT: 56 U/L (ref 17–63)
AST: 23 U/L (ref 15–41)
Albumin: 2.4 g/dL — ABNORMAL LOW (ref 3.5–5.0)
Alkaline Phosphatase: 109 U/L (ref 38–126)
Anion gap: 10 (ref 5–15)
BUN: 81 mg/dL — ABNORMAL HIGH (ref 6–20)
CO2: 19 mmol/L — ABNORMAL LOW (ref 22–32)
Calcium: 8.3 mg/dL — ABNORMAL LOW (ref 8.9–10.3)
Chloride: 109 mmol/L (ref 101–111)
Creatinine, Ser: 2.59 mg/dL — ABNORMAL HIGH (ref 0.61–1.24)
GFR calc Af Amer: 30 mL/min — ABNORMAL LOW (ref >60)
GFR calc non Af Amer: 26 mL/min — ABNORMAL LOW (ref >60)
Glucose, Bld: 161 mg/dL — ABNORMAL HIGH (ref 65–99)
Potassium: 4.4 mmol/L (ref 3.5–5.1)
Sodium: 138 mmol/L (ref 135–145)
Total Bilirubin: 0.2 mg/dL — ABNORMAL LOW (ref 0.3–1.2)
Total Protein: 5.2 g/dL — ABNORMAL LOW (ref 6.5–8.1)

## 2016-06-02 LAB — CARBOXYHEMOGLOBIN
CARBOXYHEMOGLOBIN: 0.8 % (ref 0.5–1.5)
CARBOXYHEMOGLOBIN: 0.4 % — AB (ref 0.5–1.5)
Carboxyhemoglobin: 0.6 % (ref 0.5–1.5)
METHEMOGLOBIN: 1.3 % (ref 0.0–1.5)
Methemoglobin: 1.1 % (ref 0.0–1.5)
Methemoglobin: 1.3 % (ref 0.0–1.5)
O2 Saturation: 54.1 %
O2 Saturation: 64.4 %
O2 Saturation: 94.4 %
Total hemoglobin: 9.2 g/dL — ABNORMAL LOW (ref 12.0–16.0)
Total hemoglobin: 10.4 g/dL — ABNORMAL LOW (ref 12.0–16.0)
Total hemoglobin: 11.1 g/dL — ABNORMAL LOW (ref 12.0–16.0)

## 2016-06-02 LAB — GLUCOSE, CAPILLARY
GLUCOSE-CAPILLARY: 159 mg/dL — AB (ref 65–99)
GLUCOSE-CAPILLARY: 91 mg/dL (ref 65–99)
Glucose-Capillary: 119 mg/dL — ABNORMAL HIGH (ref 65–99)
Glucose-Capillary: 152 mg/dL — ABNORMAL HIGH (ref 65–99)
Glucose-Capillary: 179 mg/dL — ABNORMAL HIGH (ref 65–99)
Glucose-Capillary: 183 mg/dL — ABNORMAL HIGH (ref 65–99)

## 2016-06-02 LAB — PROTEIN, BODY FLUID: Total protein, fluid: <3 g/dL

## 2016-06-02 LAB — POCT I-STAT 3, ART BLOOD GAS (G3+)
Acid-base deficit: 7 mmol/L — ABNORMAL HIGH (ref 0.0–2.0)
Bicarbonate: 20.4 mmol/L (ref 20.0–28.0)
O2 Saturation: 95 %
PCO2 ART: 48.5 mmHg — AB (ref 32.0–48.0)
PH ART: 7.236 — AB (ref 7.350–7.450)
Patient temperature: 37.8
TCO2: 22 mmol/L (ref 0–100)
pO2, Arterial: 91 mmHg (ref 83.0–108.0)

## 2016-06-02 LAB — CBC
HCT: 28.1 % — ABNORMAL LOW (ref 39.0–52.0)
HEMOGLOBIN: 9 g/dL — AB (ref 13.0–17.0)
MCH: 28.9 pg (ref 26.0–34.0)
MCHC: 32 g/dL (ref 30.0–36.0)
MCV: 90.4 fL (ref 78.0–100.0)
PLATELETS: 223 10*3/uL (ref 150–400)
RBC: 3.11 MIL/uL — AB (ref 4.22–5.81)
RDW: 13.8 % (ref 11.5–15.5)
WBC: 13.2 10*3/uL — AB (ref 4.0–10.5)

## 2016-06-02 LAB — PROCALCITONIN
PROCALCITONIN: 0.12 ng/mL
PROCALCITONIN: 0.11 ng/mL

## 2016-06-02 LAB — GRAM STAIN

## 2016-06-02 LAB — LACTATE DEHYDROGENASE, PLEURAL OR PERITONEAL FLUID: LD FL: 61 U/L — AB (ref 3–23)

## 2016-06-02 LAB — HEPARIN LEVEL (UNFRACTIONATED)
HEPARIN UNFRACTIONATED: 0.15 [IU]/mL — AB (ref 0.30–0.70)
Heparin Unfractionated: 0.27 IU/mL — ABNORMAL LOW (ref 0.30–0.70)

## 2016-06-02 LAB — MAGNESIUM: MAGNESIUM: 1.8 mg/dL (ref 1.7–2.4)

## 2016-06-02 LAB — GLUCOSE, SEROUS FLUID: Glucose, Fluid: 168 mg/dL

## 2016-06-02 LAB — PHOSPHORUS: PHOSPHORUS: 5.5 mg/dL — AB (ref 2.5–4.6)

## 2016-06-02 MED ORDER — PIPERACILLIN-TAZOBACTAM 3.375 G IVPB
3.3750 g | Freq: Three times a day (TID) | INTRAVENOUS | Status: DC
  Administered 2016-06-02 – 2016-06-05 (×9): 3.375 g via INTRAVENOUS
  Filled 2016-06-02 (×11): qty 50

## 2016-06-02 MED ORDER — VANCOMYCIN HCL IN DEXTROSE 750-5 MG/150ML-% IV SOLN
750.0000 mg | INTRAVENOUS | Status: DC
  Administered 2016-06-03: 750 mg via INTRAVENOUS
  Filled 2016-06-02 (×2): qty 150

## 2016-06-02 MED ORDER — VANCOMYCIN HCL 10 G IV SOLR
1250.0000 mg | Freq: Once | INTRAVENOUS | Status: AC
  Administered 2016-06-02: 1250 mg via INTRAVENOUS
  Filled 2016-06-02: qty 1250

## 2016-06-02 MED ORDER — SODIUM BICARBONATE 8.4 % IV SOLN
50.0000 meq | Freq: Once | INTRAVENOUS | Status: AC
  Administered 2016-06-02: 50 meq via INTRAVENOUS
  Filled 2016-06-02: qty 50

## 2016-06-02 MED ORDER — AMIODARONE HCL IN DEXTROSE 360-4.14 MG/200ML-% IV SOLN
30.0000 mg/h | INTRAVENOUS | Status: DC
  Administered 2016-06-02 – 2016-06-04 (×6): 30 mg/h via INTRAVENOUS
  Filled 2016-06-02 (×7): qty 200

## 2016-06-02 MED ORDER — SODIUM BICARBONATE 8.4 % IV SOLN
INTRAVENOUS | Status: DC
  Administered 2016-06-02: 14:00:00 via INTRAVENOUS
  Filled 2016-06-02 (×3): qty 150

## 2016-06-02 NOTE — Progress Notes (Signed)
ANTICOAGULATION f/u [place] for Heparin Indication: chest pain/ACS; now with IABP in place  No Known Allergies  Patient Measurements: Height: 5\' 6"  (167.6 cm) Weight: 158 lb 11.7 oz (72 kg) IBW/kg (Calculated) : 63.8 Heparin Dosing Weight: 63.5 kg  Vital Signs: Temp: 99.7 F (37.6 C) (09/19 2112) BP: 101/60 (09/19 2100) Pulse Rate: 46 (09/19 2112)  Labs:  Recent Labs  05/31/16 1435 05/31/16 1824 05/31/16 2311 06/01/16 0200 06/01/16 0438 06/01/16 0439 06/01/16 1650 06/02/16 0426 06/02/16 0427 06/02/16 2051  HGB 10.1*  --   --   --  8.3*  --   --  9.0*  --   --   HCT 30.7*  --   --   --  25.6*  --   --  28.1*  --   --   PLT 326  --   --   --  236  --   --  223  --   --   LABPROT 12.8  --   --   --   --   --   --   --   --   --   INR 0.96  --   --   --   --   --   --   --   --   --   HEPARINUNFRC  --   --   --  0.31  --   --   --   --  0.15* 0.27*  CREATININE 2.69* 2.84*  --   --   --  2.66* 2.58* 2.59*  --   --   TROPONINI 1.43* 1.68* 2.46*  --   --  2.90*  --   --   --   --    Estimated Creatinine Clearance: 28.1 mL/min (by C-G formula based on SCr of 2.59 mg/dL (H)).  Medical History: No past medical history on file.  Assessment: 60 year old male s/p cath which showed severe 3V CAD, IABP placed in cath lab. Plan for CABG when pt stable. Per TCTS, pt will likely need IABP until CABG. Heparin level therapeutic (0.27) on gtt at 850 units/hr. No issues with line or bleeding reported per RN. CBC stable.   Goal of Therapy:  Heparin level 0.2-0.5 on IABP Monitor platelets by anticoagulation protocol: Yes   Plan:  1. Continue IV heparin to 850 units/hr 2. Daily heparin level and CBC  Kenneth [name], PharmD Clinical Pharmacist Pager: [phone] 06/02/2016 9:49 PM

## 2016-06-02 NOTE — Progress Notes (Signed)
ANTICOAGULATION f/u [place] for Heparin Indication: chest pain/ACS; now with IABP in place  No Known Allergies  Patient Measurements: Height: 5\' 6"  (167.6 cm) Weight: 159 lb 6.3 oz (72.3 kg) IBW/kg (Calculated) : 63.8 Heparin Dosing Weight: 63.5 kg  Vital Signs: Temp: 99.7 F (37.6 C) (09/19 0400) Temp Source: Core (Comment) (09/19 0400) BP: 118/62 (09/19 0400) Pulse Rate: 69 (09/19 0400)  Labs:  Recent Labs  05/31/16 1435 05/31/16 1824 05/31/16 2311 06/01/16 0200 06/01/16 0438 06/01/16 0439 06/01/16 1650 06/02/16 0426 06/02/16 0427  HGB 10.1*  --   --   --  8.3*  --   --  9.0*  --   HCT 30.7*  --   --   --  25.6*  --   --  28.1*  --   PLT 326  --   --   --  236  --   --  223  --   LABPROT 12.8  --   --   --   --   --   --   --   --   INR 0.96  --   --   --   --   --   --   --   --   HEPARINUNFRC  --   --   --  0.31  --   --   --   --  0.15*  CREATININE 2.69* 2.84*  --   --   --  2.66* 2.58*  --   --   TROPONINI 1.43* 1.68* 2.46*  --   --  2.90*  --   --   --     Estimated Creatinine Clearance: 28.2 mL/min (by C-G formula based on SCr of 2.58 mg/dL (H)).   Medical History: No past medical history on file.  Assessment: 60 year old male s/p cath which showed severe 3V CAD, IABP placed in cath lab. Plan for CABG when pt stable. Per TCTS, pt will likely need IABP until CABG. Heparin level down to subtherapeutic (0.15) on gtt at 750 units/hr. No issues with line or bleeding reported per RN. CBC stable.   Goal of Therapy:  Heparin level 0.2-0.5 on IABP Monitor platelets by anticoagulation protocol: Yes   Plan:  1. Increase IV heparin to 850 units/hr 2. Will f/u heparin level in 8 hours.  Sherlon Handing, PharmD, BCPS Clinical pharmacist, pager [phone]  06/02/2016 4:58 AM

## 2016-06-02 NOTE — Progress Notes (Addendum)
Pre-op Cardiac Surgery  The study was technically limited due to immobility, acoustic shadow, balloon pump, vessel movement, and surgical dressings.  Unable to accurately measure velocities due to balloon pump affecting the waveforms. Unable to obtain left brachial pressure due to IV.  Carotid Findings:   Findings are consistent with mild intimal thickening and mild plaque formations involving the left internal carotid artery.   Upper Extremity Right Left  Brachial Pressures 114  Patent IV  Patent  Radial Waveforms Patent Patent  Ulnar Waveforms Patent Patent  Palmar Arch (Allen's Test) Palmar waveforms remain within normal limits with radial compression and decrease greater than 50 percent with ulnar compression. Palmar waveforms remain within normal limits with radial and ulnar compression.    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

## 2016-06-02 NOTE — Procedures (Signed)
Thoracentesis Procedure Note  Pre-operative Diagnosis: right pleural effusion   Post-operative Diagnosis: same  Indications: vent weaning  Procedure Details  Consent: Informed consent was obtained. Risks of the procedure were discussed including: infection, bleeding, pain, pneumothorax.  Under sterile conditions the patient was positioned. Betadine solution and sterile drapes were utilized.  1% buffered lidocaine was used to anesthetize the pleural   Space which was identified via real time [place]. Fluid was obtained without any difficulties and minimal blood loss.  A dressing was applied to the wound and wound care instructions were provided.   Findings 1000 ml of clear pleural fluid was obtained. A sample was sent to Pathology for chemistries, cell counts, as well as for infection analysis.  Complications:  None; patient tolerated the procedure well.          Condition: stable  Erick Colace ACNP-BC [place] Pager # [phone] OR # [phone] if no answer   US guidance  Lavon Paganini. Titus Mould, MD, [place] Pgr: [facility]

## 2016-06-02 NOTE — Progress Notes (Signed)
13:00 ABG are as follows; pH; 7.23, PCO2: 48.5, PO2: 91, HCO3: 20.4, sO2: 95%. Dr. Halford Chessman was made aware of ABG results & wants to start a bicarb drip at this time. RN was made aware.

## 2016-06-02 NOTE — Progress Notes (Signed)
Pharmacy Antibiotic Note  Kenneth [name] is a 60 y.o. male with VDRF, now with fevers and leukocytosis   Pharmacy has been consulted for Vancomycin and Zosyn dosing.  Plan: Vancomycin 1250 mg IV now, then 750 mg IV q24h Zosyn 3.375 g IV q8h   Height: 5\' 6"  (167.6 cm) Weight: 158 lb 11.7 oz (72 kg) IBW/kg (Calculated) : 63.8  Temp (24hrs), Avg:100 F (37.8 C), Min:99 F (37.2 C), Max:100.8 F (38.2 C)   Recent Labs Lab 05/31/16 1435 05/31/16 1824 05/31/16 2345 06/01/16 0438 06/01/16 0439 06/01/16 0952 06/01/16 1650 06/02/16 0426  WBC 10.3  --   --  8.5  --   --   --  13.2*  CREATININE 2.69* 2.84*  --   --  2.66*  --  2.58* 2.59*  LATICACIDVEN  --   --  1.0  --   --  0.8  --   --     Estimated Creatinine Clearance: 28.1 mL/min (by C-G formula based on SCr of 2.59 mg/dL (H)).    No Known Allergies   Caryl Pina 06/02/2016 7:44 AM

## 2016-06-02 NOTE — Progress Notes (Signed)
Advanced Heart Failure Rounding Note  Referring Physician: Raylene Miyamoto, MD  Primary Physician: Rodena Medin, MD Primary Cardiologist: New (Dr. Irish Lack initially consulted) Primary HF: New ( Dr Aundra Dubin)  Reason for Consultation: Acute respiratory failure  Subjective:    KEYANDRE PILEGGI is a 60 y.o. male with hx of poorly controlled diabetes, HTN, CKD IV, and HLD.  Admitted 05/31/16 with orthopnea, DOE, and CP. EKG did not meet stemi criteria.  Troponin 1.4. Echo showed global LV dysfunction. Started on NIMV for acute respiratory failure, failed requiring intubation.   Echo 05/31/16 LVEF35-40%, Grade 2 DD  LHC 05/31/16 showed LM lesion, 40 %stenosed, Ramus lesion, 80 %stenosed, Ost Cx to Prox Cx lesion, 80 %stenosed, Mid LAD lesion, 90 %stenosed, Ost 2nd Diag to 2nd Diag lesion, 90 %stenosed, Prox RCA to Mid RCA lesion, 100 %stenosed.  IABP inserted. Cardiac surgery consulted, as well as CHF team.   Atrial fibrillation with RVR developed 9/18, now on amiodarone gtt and in NSR.   Remains intubated/sedated and on IABP at 1:1 support this am.   Good UOP yesterday, I/Os nearly even however.  Creatinine stable at 2.59.   Fever, maximal 100.8.    Swan numbers this morning CVP 5 PA 27/18 Mean PCWP 14 CI 2.8 Co-ox 54%  Objective:   Weight Range: 158 lb 11.7 oz (72 kg) Body mass index is 25.62 kg/m.   Vital Signs:   Temp:  [99 F (37.2 C)-100.8 F (38.2 C)] 99.9 F (37.7 C) (09/19 0700) Pulse Rate:  [34-191] 61 (09/19 0700) Resp:  [7-28] 22 (09/19 0700) BP: (102-166)/(41-94) 102/46 (09/19 0600) SpO2:  [95 %-100 %] 97 % (09/19 0700) Arterial Line BP: (121-182)/(38-61) 166/58 (09/19 0700) FiO2 (%):  [40 %-60 %] 40 % (09/19 0400) Weight:  [158 lb 11.7 oz (72 kg)] 158 lb 11.7 oz (72 kg) (09/19 0400)    Weight change: Filed Weights   05/31/16 2100 06/01/16 0400 06/02/16 0400  Weight: 162 lb 14.7 oz (73.9 kg) 159 lb 6.3 oz (72.3 kg) 158 lb 11.7 oz (72 kg)     Intake/Output:   Intake/Output Summary (Last 24 hours) at 06/02/16 0741 Last data filed at 06/02/16 0600  Gross per 24 hour  Intake          2870.57 ml  Output             2915 ml  Net           -44.43 ml     Physical Exam: CVP 5-6 General:  Intubated and sedated HEENT: ET tube + [place] Neck: supple. JVP 5-6. Carotids 2+ bilat; no bruits. No thyromegaly or nodule noted.  Cor: PMI nondisplaced. Regular rate & rhythm. No rubs, gallops or murmurs. Lungs: Mechanical breathing sounds.  Abdomen: soft, ND, no HSM. No bruits or masses. +BS  Extremities: no cyanosis, clubbing, rash. 1-2+ edema. LEs cool to the touch. R groin IABP stable.  Neuro: alert & orientedx3, cranial nerves grossly intact. moves all 4 extremities w/o difficulty. Affect pleasant  Telemetry: Reviewed, NSR  Labs: CBC  Recent Labs  06/01/16 0438 06/02/16 0426  WBC 8.5 13.2*  HGB 8.3* 9.0*  HCT 25.6* 28.1*  MCV 90.1 90.4  PLT 236 128   Basic Metabolic Panel  Recent Labs  06/01/16 1650 06/02/16 0426  NA 137 138  K 4.1 4.4  CL 111 109  CO2 19* 19*  GLUCOSE 127* 161*  BUN 82* 81*  CREATININE 2.58* 2.59*  CALCIUM 8.1* 8.3*  MG  1.7 1.8  PHOS 5.4* 5.5*   Liver Function Tests  Recent Labs  05/31/16 1824 06/02/16 0426  AST 53* 23  ALT 97* 56  ALKPHOS 115 109  BILITOT 0.6 0.2*  PROT 5.2* 5.2*  ALBUMIN 2.6* 2.4*   No results for input(s): LIPASE, AMYLASE in the last 72 hours. Cardiac Enzymes  Recent Labs  05/31/16 1824 05/31/16 2311 06/01/16 0439  TROPONINI 1.68* 2.46* 2.90*    BNP: BNP (last 3 results)  Recent Labs  05/31/16 1435  BNP 2,537.9*    ProBNP (last 3 results) No results for input(s): PROBNP in the last 8760 hours.   D-Dimer No results for input(s): DDIMER in the last 72 hours. Hemoglobin A1C No results for input(s): HGBA1C in the last 72 hours. Fasting Lipid Panel  Recent Labs  05/31/16 1824  TRIG 195*   Thyroid Function Tests No results for  input(s): TSH, T4TOTAL, T3FREE, THYROIDAB in the last 72 hours.  Invalid input(s): FREET3  Other results:     Imaging/Studies:  Dg Chest Port 1 View  Result Date: 06/02/2016 CLINICAL DATA:  Congestive heart failure EXAM: PORTABLE CHEST 1 VIEW COMPARISON:  06/01/2016 FINDINGS: Endotracheal tube in good position. Intra-aortic balloon pump at the level of the aortic arch unchanged. NG in the stomach. Swan-Ganz catheter in a distal branch of the right lower lobe pulmonary artery. This appears more distally located than on the prior study. No pneumothorax Extensive bibasilar consolidation and bilateral effusions unchanged. Pulmonary vascular congestion. IMPRESSION: Endotracheal tube in good position. Swan-Ganz catheter more distally positioned in a distal branch of the right lower lobe pulmonary artery Bibasilar consolidation most likely atelectasis related to moderately large pleural effusions. This is most likely related to fluid overload and is unchanged. Electronically Signed   By: Franchot Gallo M.D.   On: 06/02/2016 07:31   Dg Chest Port 1 View  Result Date: 06/01/2016 CLINICAL DATA:  Intra-aortic balloon pump. EXAM: PORTABLE CHEST 1 VIEW COMPARISON:  05/31/2016 . FINDINGS: Endotracheal tube, NG tube, Swan-Ganz catheter, intra aortic balloon pump marker in stable position. Diffuse bilateral pulmonary infiltrates/edema again noted. Bilateral pleural effusions again noted. Persistent cardiomegaly. No pneumothorax IMPRESSION: 1. Lines and tubes including aortic balloon pump in stable position. 2. Cardiomegaly with diffuse bilateral pulmonary infiltrates and bilateral pleural effusions consistent with CHF again noted. No significant change . Electronically Signed   By: Marcello Moores  Register   On: 06/01/2016 07:41   Dg Chest Port 1 View  Result Date: 06/01/2016 CLINICAL DATA:  On intra-aortic balloon pump assist EXAM: PORTABLE CHEST 1 VIEW COMPARISON:  05/31/2016 FINDINGS: Endotracheal tube with tip  measuring 8.2 cm above the carina. Enteric tube tip is off the field of view but below the left hemidiaphragm. Right Swan-Ganz catheter is been placed with tip over the right hilum. No pneumothorax. Bilateral pleural effusions. Bilateral perihilar infiltrates likely to represent edema. Entry aortic balloon pump marker projects over the aortic arch. Aortic calcification. Mild cardiac enlargement. IMPRESSION: Appliances appear in satisfactory position. Bilateral pleural effusions and perihilar edema similar to prior study. Electronically Signed   By: Lucienne Capers M.D.   On: 06/01/2016 00:15   Dg Chest Port 1 View  Result Date: 05/31/2016 CLINICAL DATA:  Pulmonary edema, orogastric tube EXAM: PORTABLE CHEST 1 VIEW COMPARISON:  Portable exam 1757 hours compared to 1523 hours FINDINGS: Tip of endotracheal tube projects 7.2 cm above carina. Orogastric tube extends into stomach. Enlargement of cardiac silhouette with vascular congestion. Perihilar infiltrates likely representing pulmonary edema, slightly improved since  previous exam. Bibasilar effusions and atelectasis ; unable to exclude consolidation in LEFT lower lobe were more dense opacification is identified. No pneumothorax. Bones demineralized. IMPRESSION: Slightly improved pulmonary edema. Bibasilar pleural effusions atelectasis, unable to exclude consolidation in LEFT lower lobe. Electronically Signed   By: Ulyses Southward M.D.   On: 05/31/2016 18:19   Dg Chest Portable 1 View  Result Date: 05/31/2016 CLINICAL DATA:  Chest pain.  Post intubation. EXAM: PORTABLE CHEST 1 VIEW COMPARISON:  05/31/2016 FINDINGS: Interval placement of a nasogastric tube which courses into the region of the stomach as tip is not visualized. Interval placement of endotracheal tube with tip 7.5 cm above the carina. Lungs are adequately inflated demonstrate persistent bilateral perihilar bibasilar opacification likely due to interstitial edema with moderate bilateral pleural  effusions and bibasilar atelectasis. Cannot exclude infection. Mild stable cardiomegaly. Remainder the exam is unchanged. IMPRESSION: Constellation of findings likely representing moderate interstitial edema/ CHF with moderate bilateral effusions/bibasilar atelectasis. Cannot exclude infection in the mid to lower lungs. Tubes and lines as described. Electronically Signed   By: Elberta Fortis M.D.   On: 05/31/2016 15:59   Dg Chest Port 1 View  Result Date: 05/31/2016 CLINICAL DATA:  Patient with chest pain. EXAM: PORTABLE CHEST 1 VIEW COMPARISON:  None. FINDINGS: Patient is rotated to the right. Cardiomegaly. Bilateral perihilar and lower lung interstitial opacities and consolidation. Moderate bilateral pleural effusions. Biapical pleural parenchymal thickening. No pneumothorax. IMPRESSION: Cardiomegaly. Bilateral perihilar and lower lung interstitial opacities and consolidation favored to represent edema. Infection could have a similar appearance. Moderate bilateral pleural effusions. Electronically Signed   By: Annia Belt M.D.   On: 05/31/2016 15:23   Dg Abd Portable 1v  Result Date: 06/01/2016 CLINICAL DATA:  OG tube placement EXAM: PORTABLE ABDOMEN - 1 VIEW COMPARISON:  None. FINDINGS: OG tube tip is in the mid stomach. Nonobstructive bowel gas pattern. IMPRESSION: OG tube tip in the mid stomach. Electronically Signed   By: Charlett Nose M.D.   On: 06/01/2016 07:40    Latest Echo  Latest Cath   Medications:     Scheduled Medications: . aspirin  81 mg Oral Daily  . atorvastatin  80 mg Oral q1800  . chlorhexidine gluconate (MEDLINE KIT)  15 mL Mouth Rinse BID  . Chlorhexidine Gluconate Cloth  6 each Topical Daily  . furosemide  60 mg Intravenous Q12H  . hydrALAZINE  12.5 mg Oral Q8H  . insulin aspart  2-6 Units Subcutaneous Q4H  . insulin glargine  10 Units Subcutaneous Daily  . isosorbide dinitrate  5 mg Oral TID  . mouth rinse  15 mL Mouth Rinse 10 times per day  . mupirocin ointment   1 application Nasal BID  . pantoprazole sodium  40 mg Per Tube Q24H  . sodium bicarbonate  50 mEq Intravenous Once  . sodium chloride flush  10-40 mL Intracatheter Q12H    Infusions: . amiodarone 30 mg/hr (06/02/16 0101)  . feeding supplement (VITAL AF 1.2 CAL) 1,000 mL (06/01/16 1700)  . fentaNYL infusion INTRAVENOUS 200 mcg/hr (06/01/16 2300)  . heparin 850 Units/hr (06/02/16 0503)  . propofol (DIPRIVAN) infusion 15 mcg/kg/min (06/02/16 0442)    PRN Medications: sodium chloride, Place/Maintain arterial line **AND** sodium chloride, acetaminophen (TYLENOL) oral liquid 160 mg/5 mL, fentaNYL, Influenza vac split quadrivalent PF, ondansetron (ZOFRAN) IV, sodium chloride flush   Assessment/Plan   CRESENCIO REESOR is a 60 y.o. male with hx of poorly controlled diabetes, HTN, CKD IV, and HLD.  Admitted 05/31/16  with orthopnea, DOE, and CP.  Intubated, sedated, placed on IABP.  HF team consulted for optimization. Cardiac surgery consulted as well with 3 vessel disease.    1. Acute on chronic systolic CHF: EF 54-27% with wall motion abnormalities, ischemic cardiomyopathy.  Filling pressures optimized by Luiz Blare this morning.  CI 2.8 off Swan though co-ox reading is marginal.  Creatinine stable, 2.59.  Remains intubated.  - Recheck co-ox.  - Keep IABP in place at 1:1. - Got Lasix 60 mg IV this morning, will hold further Lasix for now with low CVP and PCWP.  - Can continue low dose hydralazine 12.5 tid + Imdur 15.    2. CAD: Admitted with NSTEMI, severe 3VD.  - ASA, atorvastatin 80.  - CVTS following for CABG later this week hopefully.  3. CKD: Stage IV.  Creatinine stable at 2.59.  We do not know his baseline but may be near it.  4. Acute respiratory failure: Today, CXR shows pleural effusions.  Can try to wean vent today.   5. Anemia: Right groin IABP site stable. Hemoglobin higher today. 6. ID: Temperature up to 100.8, WBCs elevated.  Will add empiric vanco/Zosyn (?PNA).  Cultures have been sent.     35 minutes critical care time.   Length of Stay: 2  Loralie Champagne  06/02/2016, 7:41 AM  Advanced Heart Failure Team Pager [phone] (M-F; 7a - 4p)  Please contact [facility] for night-coverage after hours (4p -7a ) and weekends on [email or web address]

## 2016-06-02 NOTE — Progress Notes (Signed)
PULMONARY / CRITICAL CARE MEDICINE   Name: Kenneth [name] MRN: [id number] DOB: 02-16-1956    ADMISSION DATE:  05/31/2016 CONSULTATION DATE:  9/17   REFERRING MD:  Er md medctr  CHIEF COMPLAINT:  Sob, resp failure  HISTORY OF PRESENT ILLNESS:   60 yo male male presented with dyspnea felt to be related to respiratory infection.  He had progressive orthopnea, chest discomfort, lower extremity edema, and respiratory failure from acute pulmonary edema with acute combined CHF.  PMHx of DM, HTN, HLD, CKD 4  SUBJECTIVE:  Tolerating some pressure support.  Minimal respiratory secretions.  Low grade temperature.  VITAL SIGNS: BP (!) 102/46   Pulse 61   Temp 99.9 F (37.7 C)   Resp (!) 22   Ht 5\' 6"  (1.676 m)   Wt 158 lb 11.7 oz (72 kg)   SpO2 97%   BMI 25.62 kg/m   HEMODYNAMICS: PAP: (15-53)/(11-25) 45/21 CVP:  [0 mmHg-13 mmHg] 5 mmHg PCWP:  [14 mmHg-21 mmHg] 14 mmHg CO:  [4.3 L/min-5.7 L/min] 5.2 L/min CI:  [2.3 L/min/m2-3.1 L/min/m2] 2.8 L/min/m2  VENTILATOR SETTINGS: Vent Mode: PRVC FiO2 (%):  [40 %-60 %] 40 % Set Rate:  [26 bmp] 26 bmp Vt Set:  [500 mL] 500 mL PEEP:  [5 cmH20] 5 cmH20 Plateau Pressure:  [17 cmH20-34 cmH20] 34 cmH20  INTAKE / OUTPUT: I/O last 3 completed shifts: In: 3708.6 [I.V.:1904.2; NG/GT:1704.3; IV Piggyback:100] Out: 4685 [Urine:4685]  PHYSICAL EXAMINATION: General:  Sedated  Neuro: Opens eyes with stimulation, moves extremities HEENT:  ETT in place Cardiovascular: regular, no murmur Lungs: basilar crackles Abdomen:  Soft, non tender Musculoskeletal:  2+ edema Skin: cool, no rashes  LABS:  BMET  Recent Labs Lab 06/01/16 0439 06/01/16 1650 06/02/16 0426  NA 137 137 138  K 4.9 4.1 4.4  CL 112* 111 109  CO2 18* 19* 19*  BUN 80* 82* 81*  CREATININE 2.66* 2.58* 2.59*  GLUCOSE 172* 127* 161*    Electrolytes  Recent Labs Lab 06/01/16 0439 06/01/16 1650 06/02/16 0426  CALCIUM 8.4* 8.1* 8.3*  MG 1.9 1.7 1.8  PHOS 5.1* 5.4*  5.5*    CBC  Recent Labs Lab 05/31/16 1435 06/01/16 0438 06/02/16 0426  WBC 10.3 8.5 13.2*  HGB 10.1* 8.3* 9.0*  HCT 30.7* 25.6* 28.1*  PLT 326 236 223    Coag's  Recent Labs Lab 05/31/16 1435  INR 0.96    Sepsis Markers  Recent Labs Lab 05/31/16 2345 06/01/16 0952  LATICACIDVEN 1.0 0.8    ABG  Recent Labs Lab 06/01/16 0453 06/01/16 0902 06/02/16 0400  PHART 7.319* 7.392 7.299*  PCO2ART 33.4 36.0 38.4  PO2ART 146.0* 70.0* 80.5*    Liver Enzymes  Recent Labs Lab 05/31/16 1824 06/02/16 0426  AST 53* 23  ALT 97* 56  ALKPHOS 115 109  BILITOT 0.6 0.2*  ALBUMIN 2.6* 2.4*    Cardiac Enzymes  Recent Labs Lab 05/31/16 1824 05/31/16 2311 06/01/16 0439  TROPONINI 1.68* 2.46* 2.90*    Glucose  Recent Labs Lab 06/01/16 1125 06/01/16 1645 06/01/16 1956 06/02/16 0001 06/02/16 0403 06/02/16 0752  GLUCAP 162* 122* 129* 119* 159* 179*    Imaging Dg Chest Port 1 View  Result Date: 06/02/2016 CLINICAL DATA:  Congestive heart failure EXAM: PORTABLE CHEST 1 VIEW COMPARISON:  06/01/2016 FINDINGS: Endotracheal tube in good position. Intra-aortic balloon pump at the level of the aortic arch unchanged. NG in the stomach. Swan-Ganz catheter in a distal branch of the right lower lobe pulmonary artery.  This appears more distally located than on the prior study. No pneumothorax Extensive bibasilar consolidation and bilateral effusions unchanged. Pulmonary vascular congestion. IMPRESSION: Endotracheal tube in good position. Swan-Ganz catheter more distally positioned in a distal branch of the right lower lobe pulmonary artery Bibasilar consolidation most likely atelectasis related to moderately large pleural effusions. This is most likely related to fluid overload and is unchanged. Electronically Signed   By: Franchot Gallo M.D.   On: 06/02/2016 07:31     STUDIES:  Echo 9/17 >> EF 35 to 40%, grade 2 DD [facility] 9/17 >> severe 3 vessel disease  CULTURES: Blood  9/19 >> Sputum 9/19 >>  ANTIBIOTICS: Vancomycin 9/19 >> Zosyn 9/19 >>  SIGNIFICANT EVENTS: 9/17 from [facility] to [facility] [place], Cardiology consulted, IABP 9/18 TCTS consulted, a fib with RVR >> amiodarone bolus  LINES/TUBES: 9/17 ETT >>  9/17 IABP >> 9/17 Rt IJ Introducer >>   ASSESSMENT / PLAN:  PULMONARY A: Acute hypoxic respiratory failure 2nd to acute pulmonary edema and pleural effusions. P:   Pressure support wean as tolerated F/u CXR, ABG Assess for thoracentesis  CARDIOVASCULAR A:  Cardiogenic shock. Acute combined CHF. 3 vessel CAD. A fib with RVR >> better 9/19 P:  Continue ASA, lipitor IABP, PA catheter per cardiology Heparin gtt per cardiology Hydralazine, lasix, isordil per cardiology TCTS planning for CABG later this week  RENAL A:   CKD 4 >> baseline creatinine 2.57 from 05/21/16. P:   Monitor renal fx, urine outpt  GASTROINTESTINAL A:   Nutrition. P:   Tube feeds Protonix for SUP  HEMATOLOGIC A:   Anemia of critical illness, chronic disease. P:  F/u CBC  INFECTIOUS A:   Low grade temperature 9/19. P:   Abx started 9/19  F/u procalcitonin >> if negative, then likely d/c Abx  ENDOCRINE A:   DM. P:   SSI with lantus  NEUROLOGIC A:   Acute metabolic encephalopathy. P:   RASS goal: 0  D/w Dr. Aundra Dubin  CC time 34 minutes.  Chesley Mires, MD [facility] Pulmonary/Critical Care 06/02/2016, 7:56 AM Pager:  [phone] After 3pm call: [phone]

## 2016-06-02 NOTE — Progress Notes (Signed)
Orthopedic Tech Progress Note Patient Details:  Kenneth [name] 01/14/1956 [id number]  Ortho Devices Type of Ortho Device: Knee Immobilizer Ortho Device/Splint Location: drop off   [name] 06/02/2016, 11:10 AM

## 2016-06-02 NOTE — Progress Notes (Signed)
Combined metabolic/respiratory acidosis on ABG.  Breathing over set vent rate.  Will add HCO3 to IV fluid.

## 2016-06-03 ENCOUNTER — Inpatient Hospital Stay (HOSPITAL_COMMUNITY): Payer: BLUE CROSS/BLUE SHIELD

## 2016-06-03 LAB — CBC
HCT: 25.5 % — ABNORMAL LOW (ref 39.0–52.0)
HCT: 22.6 % — ABNORMAL LOW (ref 39.0–52.0)
HEMOGLOBIN: 7.3 g/dL — AB (ref 13.0–17.0)
Hemoglobin: 8 g/dL — ABNORMAL LOW (ref 13.0–17.0)
MCH: 29.1 pg (ref 26.0–34.0)
MCH: 29.2 pg (ref 26.0–34.0)
MCHC: 31.4 g/dL (ref 30.0–36.0)
MCHC: 32.3 g/dL (ref 30.0–36.0)
MCV: 92.7 fL (ref 78.0–100.0)
MCV: 90.4 fL (ref 78.0–100.0)
PLATELETS: 152 10*3/uL (ref 150–400)
Platelets: 125 10*3/uL — ABNORMAL LOW (ref 150–400)
RBC: 2.75 MIL/uL — AB (ref 4.22–5.81)
RBC: 2.5 MIL/uL — AB (ref 4.22–5.81)
RDW: 14 % (ref 11.5–15.5)
RDW: 14 % (ref 11.5–15.5)
WBC: 13.6 10*3/uL — AB (ref 4.0–10.5)
WBC: 9 10*3/uL (ref 4.0–10.5)

## 2016-06-03 LAB — GLUCOSE, CAPILLARY
GLUCOSE-CAPILLARY: 274 mg/dL — AB (ref 65–99)
GLUCOSE-CAPILLARY: 282 mg/dL — AB (ref 65–99)
GLUCOSE-CAPILLARY: 351 mg/dL — AB (ref 65–99)
GLUCOSE-CAPILLARY: 327 mg/dL — AB (ref 65–99)
GLUCOSE-CAPILLARY: 205 mg/dL — AB (ref 65–99)
GLUCOSE-CAPILLARY: 85 mg/dL (ref 65–99)
GLUCOSE-CAPILLARY: 182 mg/dL — AB (ref 65–99)
GLUCOSE-CAPILLARY: 166 mg/dL — AB (ref 65–99)
Glucose-Capillary: 105 mg/dL — ABNORMAL HIGH (ref 65–99)
Glucose-Capillary: 124 mg/dL — ABNORMAL HIGH (ref 65–99)
Glucose-Capillary: 125 mg/dL — ABNORMAL HIGH (ref 65–99)
Glucose-Capillary: 161 mg/dL — ABNORMAL HIGH (ref 65–99)
Glucose-Capillary: 181 mg/dL — ABNORMAL HIGH (ref 65–99)
Glucose-Capillary: 197 mg/dL — ABNORMAL HIGH (ref 65–99)
Glucose-Capillary: 199 mg/dL — ABNORMAL HIGH (ref 65–99)
Glucose-Capillary: 260 mg/dL — ABNORMAL HIGH (ref 65–99)
Glucose-Capillary: 331 mg/dL — ABNORMAL HIGH (ref 65–99)

## 2016-06-03 LAB — COMPREHENSIVE METABOLIC PANEL
ALT: 36 U/L (ref 17–63)
AST: 18 U/L (ref 15–41)
Albumin: 2 g/dL — ABNORMAL LOW (ref 3.5–5.0)
Alkaline Phosphatase: 84 U/L (ref 38–126)
Anion gap: 10 (ref 5–15)
BUN: 85 mg/dL — ABNORMAL HIGH (ref 6–20)
CO2: 23 mmol/L (ref 22–32)
Calcium: 7.8 mg/dL — ABNORMAL LOW (ref 8.9–10.3)
Chloride: 104 mmol/L (ref 101–111)
Creatinine, Ser: 3.18 mg/dL — ABNORMAL HIGH (ref 0.61–1.24)
GFR calc Af Amer: 23 mL/min — ABNORMAL LOW (ref >60)
GFR calc non Af Amer: 20 mL/min — ABNORMAL LOW (ref >60)
Glucose, Bld: 295 mg/dL — ABNORMAL HIGH (ref 65–99)
Potassium: 4.5 mmol/L (ref 3.5–5.1)
Sodium: 137 mmol/L (ref 135–145)
Total Bilirubin: 0.5 mg/dL (ref 0.3–1.2)
Total Protein: 4.7 g/dL — ABNORMAL LOW (ref 6.5–8.1)

## 2016-06-03 LAB — BLOOD GAS, ARTERIAL
Acid-base deficit: 2.8 mmol/L — ABNORMAL HIGH (ref 0.0–2.0)
Bicarbonate: 23.7 mmol/L (ref 20.0–28.0)
Drawn by: 252031
FIO2: 60
MECHVT: 500 mL
O2 Saturation: 91.1 %
PEEP: 8 cmH2O
Patient temperature: 99.9
RATE: 14 resp/min
pCO2 arterial: 59.9 mmHg — ABNORMAL HIGH (ref 32.0–48.0)
pH, Arterial: 7.226 — ABNORMAL LOW (ref 7.350–7.450)
pO2, Arterial: 73.1 mmHg — ABNORMAL LOW (ref 83.0–108.0)

## 2016-06-03 LAB — TRIGLYCERIDES: TRIGLYCERIDES: 132 mg/dL (ref <150)

## 2016-06-03 LAB — CARBOXYHEMOGLOBIN
CARBOXYHEMOGLOBIN: 1 % (ref 0.5–1.5)
Methemoglobin: 1.2 % (ref 0.0–1.5)
O2 Saturation: 65.7 %
Total hemoglobin: 8.5 g/dL — ABNORMAL LOW (ref 12.0–16.0)

## 2016-06-03 LAB — HEPARIN LEVEL (UNFRACTIONATED): Heparin Unfractionated: 0.33 IU/mL (ref 0.30–0.70)

## 2016-06-03 LAB — PROCALCITONIN: PROCALCITONIN: 1.06 ng/mL

## 2016-06-03 MED ORDER — FUROSEMIDE 10 MG/ML IJ SOLN
80.0000 mg | Freq: Two times a day (BID) | INTRAMUSCULAR | Status: DC
  Administered 2016-06-03 – 2016-06-05 (×6): 80 mg via INTRAVENOUS
  Filled 2016-06-03 (×6): qty 8

## 2016-06-03 MED ORDER — SODIUM CHLORIDE 0.9 % IV SOLN
INTRAVENOUS | Status: DC
  Administered 2016-06-03: 17:00:00 via INTRAVENOUS

## 2016-06-03 MED ORDER — FUROSEMIDE 10 MG/ML IJ SOLN
INTRAMUSCULAR | Status: AC
  Filled 2016-06-03: qty 8

## 2016-06-03 MED ORDER — FUROSEMIDE 10 MG/ML IJ SOLN
60.0000 mg | Freq: Once | INTRAMUSCULAR | Status: AC
Start: 2016-06-03 — End: 2016-06-03
  Administered 2016-06-03: 60 mg via INTRAVENOUS

## 2016-06-03 MED ORDER — SODIUM CHLORIDE 0.9 % IV SOLN
INTRAVENOUS | Status: DC
  Administered 2016-06-03: 2.9 [IU]/h via INTRAVENOUS
  Filled 2016-06-03: qty 2.5

## 2016-06-03 NOTE — Progress Notes (Signed)
Patient ID: KARIEM WOLFSON, male   DOB: [birth year], 60 y.o.   MRN: [id number]     Advanced Heart Failure Rounding Note  Referring Physician: Nelda Bucks, MD  Primary Physician: Olivia Mackie, MD Primary Cardiologist: New (Dr. Eldridge Dace initially consulted) Primary HF: New ( Dr Shirlee Latch)  Reason for Consultation: Acute respiratory failure  Subjective:    JIREH ELMORE is a 60 y.o. male with hx of poorly controlled diabetes, HTN, CKD IV, and HLD.  Admitted 05/31/16 with orthopnea, DOE, and CP. EKG did not meet stemi criteria.  Troponin 1.4. Echo showed global LV dysfunction. Started on NIMV for acute respiratory failure, failed requiring intubation.   Echo 05/31/16 LVEF35-40%, Grade 2 DD  LHC 05/31/16 showed LM lesion, 40 %stenosed, Ramus lesion, 80 %stenosed, Ost Cx to Prox Cx lesion, 80 %stenosed, Mid LAD lesion, 90 %stenosed, Ost 2nd Diag to 2nd Diag lesion, 90 %stenosed, Prox RCA to Mid RCA lesion, 100 %stenosed.  IABP inserted. Cardiac surgery consulted, as well as CHF team.   Atrial fibrillation with RVR developed 9/18, now on amiodarone gtt and in NSR.   Thoracentesis on right on 9/19, looks transudative (1000 cc off).   Remains intubated/sedated and on IABP at 1:1 support this am. CXR shows increasing left lung opacification this morning.  Remains febriile to up to 100.6, PCT has increased from 0.11 => 1.06.   UOP remains good but I/Os positive with addition of HCO3 gtt for acidemia.  PCWP and CVP creeping up.     Swan numbers this morning CVP 13 PA 46/22 Mean PCWP 23 CI 3.2 Co-ox 66%  Objective:   Weight Range: 159 lb 13.3 oz (72.5 kg) Body mass index is 25.8 kg/m.   Vital Signs:   Temp:  [99.3 F (37.4 C)-100.6 F (38.1 C)] 100.6 F (38.1 C) (09/20 0700) Pulse Rate:  [46-91] 69 (09/20 0700) Resp:  [9-51] 15 (09/20 0700) BP: (98-192)/(45-73) 124/73 (09/20 0700) SpO2:  [82 %-100 %] 99 % (09/20 0736) Arterial Line BP: (90-164)/(39-66) 155/52 (09/20 0700) FiO2 (%):   [40 %-80 %] 60 % (09/20 0736) Weight:  [159 lb 13.3 oz (72.5 kg)] 159 lb 13.3 oz (72.5 kg) (09/20 0300)    Weight change: Filed Weights   06/01/16 0400 06/02/16 0400 06/03/16 0300  Weight: 159 lb 6.3 oz (72.3 kg) 158 lb 11.7 oz (72 kg) 159 lb 13.3 oz (72.5 kg)    Intake/Output:   Intake/Output Summary (Last 24 hours) at 06/03/16 0750 Last data filed at 06/03/16 0700  Gross per 24 hour  Intake          4277.28 ml  Output             3055 ml  Net          1222.28 ml     Physical Exam: CVP 13 General:  Intubated and sedated HEENT: ET tube + [place] Neck: supple. JVP 10+. Carotids 2+ bilat; no bruits. No thyromegaly or nodule noted.  Cor: PMI nondisplaced. Regular rate & rhythm. No rubs, gallops or murmurs. Lungs: Decreased breath sounds on left.   Abdomen: soft, ND, no HSM. No bruits or masses. +BS  Extremities: no cyanosis, clubbing, rash. 1-2+ edema. LEs cool to the touch. R groin IABP stable.  Neuro: alert & orientedx3, cranial nerves grossly intact. moves all 4 extremities w/o difficulty. Affect pleasant  Telemetry: Reviewed, NSR  Labs: CBC  Recent Labs  06/02/16 0426 06/03/16 0500  WBC 13.2* 13.6*  HGB 9.0* 8.0*  HCT  28.1* 25.5*  MCV 90.4 92.7  PLT 223 152   Basic Metabolic Panel  Recent Labs  06/01/16 1650 06/02/16 0426 06/03/16 0500  NA 137 138 137  K 4.1 4.4 4.5  CL 111 109 104  CO2 19* 19* 23  GLUCOSE 127* 161* 295*  BUN 82* 81* 85*  CREATININE 2.58* 2.59* 3.18*  CALCIUM 8.1* 8.3* 7.8*  MG 1.7 1.8  --   PHOS 5.4* 5.5*  --    Liver Function Tests  Recent Labs  06/02/16 0426 06/03/16 0500  AST 23 18  ALT 56 36  ALKPHOS 109 84  BILITOT 0.2* 0.5  PROT 5.2* 4.7*  ALBUMIN 2.4* 2.0*   No results for input(s): LIPASE, AMYLASE in the last 72 hours. Cardiac Enzymes  Recent Labs  05/31/16 1824 05/31/16 2311 06/01/16 0439  TROPONINI 1.68* 2.46* 2.90*    BNP: BNP (last 3 results)  Recent Labs  05/31/16 1435  BNP 2,537.9*     ProBNP (last 3 results) No results for input(s): PROBNP in the last 8760 hours.   D-Dimer No results for input(s): DDIMER in the last 72 hours. Hemoglobin A1C No results for input(s): HGBA1C in the last 72 hours. Fasting Lipid Panel  Recent Labs  05/31/16 1824  TRIG 195*   Thyroid Function Tests No results for input(s): TSH, T4TOTAL, T3FREE, THYROIDAB in the last 72 hours.  Invalid input(s): FREET3  Other results:     Imaging/Studies:  Dg Chest Port 1 View  Result Date: 06/03/2016 CLINICAL DATA:  Acute onset of oxygen desaturation. Initial encounter. EXAM: PORTABLE CHEST 1 VIEW COMPARISON:  Chest radiograph performed 06/02/2016 FINDINGS: [name] endotracheal tube is seen ending 3-4 cm above the carina. A right IJ Swan-Ganz catheter is noted ending about the right main pulmonary artery. An enteric tube is noted extending below the diaphragm. An intra-aortic balloon pump tip is noted overlying expected position. Worsening diffuse left-sided airspace opacification reflects a combination of airspace consolidation and increasing left-sided pleural effusion. A smaller right-sided pleural effusion is seen. This may reflect asymmetric pulmonary edema or pneumonia. No pneumothorax is seen. The cardiomediastinal silhouette is borderline normal in size. No acute osseous abnormalities are seen. IMPRESSION: 1. Endotracheal tube seen ending 3-4 cm above the carina. 2. Worsening diffuse left-sided airspace opacification reflects a combination of airspace consolidation and increasing left-sided pleural effusion. Smaller right-sided pleural effusion also noted. This may reflect asymmetric pulmonary edema or pneumonia. Electronically Signed   By: Roanna Raider M.D.   On: 06/03/2016 02:17   Dg Chest Port 1 View  Result Date: 06/02/2016 CLINICAL DATA:  Central line complication. EXAM: PORTABLE CHEST 1 VIEW COMPARISON:  Radiograph of same day.  Bony thorax is unremarkable. FINDINGS:  Endotracheal and nasogastric tubes are unchanged in position. Distal tip of right internal jugular Swan-Ganz catheter is now in expected position of main pulmonary artery. Distal tip of aortic balloon catheter is unchanged projected over aortic arch. No pneumothorax is noted. Stable bilateral perihilar and basilar opacities are noted concerning for edema or inflammation. Probable mild associated pleural effusions are noted as well. IMPRESSION: Right internal jugular Swan-Ganz catheter tip has been slightly withdrawn, now on expected position of main pulmonary artery. Stable bilateral lung opacities are noted as described above. Other support apparatus are stable in position. Electronically Signed   By: Lupita Raider, M.D.   On: 06/02/2016 14:28   Dg Chest Port 1 View  Result Date: 06/02/2016 CLINICAL DATA:  Retraction of Swan-Ganz catheter EXAM: PORTABLE CHEST 1  VIEW COMPARISON:  06/02/2016 FINDINGS: Endotracheal tube, intra-aortic balloon pump, NG tube and right Swan-Ganz catheter remain in place, unchanged. The Swan-Ganz catheter remains in the right lower lobe pulmonary artery. Bilateral perihilar and lower lobe opacities are again noted, not significantly changed. IMPRESSION: Support devices are in stable position. No change in the location of the Swan-Ganz catheter. Continued bilateral perihilar and lower lobe airspace opacities. Electronically Signed   By: Rolm Baptise M.D.   On: 06/02/2016 13:21   Dg Chest Port 1 View  Result Date: 06/02/2016 CLINICAL DATA:  Status post thoracentesis. EXAM: PORTABLE CHEST 1 VIEW COMPARISON:  Portable radiograph earlier today at 10:11 a.m. FINDINGS: RIGHT thoracentesis has been performed. Despite overlying telemetry leads, there is no visible RIGHT apical or RIGHT lateral pneumothorax. Marked decrease RIGHT pleural effusion. Support tubes and lines remain stable. IMPRESSION: Marked decrease RIGHT pleural effusion.  No pneumothorax. Electronically Signed   By: Staci Righter M.D.   On: 06/02/2016 11:34   Dg Chest Port 1 View  Result Date: 06/02/2016 CLINICAL DATA:  Repositioning of Swan-Ganz catheter EXAM: PORTABLE CHEST 1 VIEW COMPARISON:  06/02/2016 FINDINGS: Swan-Ganz catheter is again noted deep within the right pulmonary artery. Intra-aortic balloon pump is noted in satisfactory position. An endotracheal tube and nasogastric catheter are again seen and stable. Bilateral pleural effusions and underlying atelectasis are again present. No new focal abnormality is seen. IMPRESSION: Swan-Ganz catheter is again noted deep within the right lower lobe pulmonary artery. Tubes and lines as described above. Stable bilateral effusions and atelectatic changes. Electronically Signed   By: Inez Catalina M.D.   On: 06/02/2016 10:18   Dg Chest Port 1 View  Result Date: 06/02/2016 CLINICAL DATA:  Congestive heart failure EXAM: PORTABLE CHEST 1 VIEW COMPARISON:  06/01/2016 FINDINGS: Endotracheal tube in good position. Intra-aortic balloon pump at the level of the aortic arch unchanged. NG in the stomach. Swan-Ganz catheter in a distal branch of the right lower lobe pulmonary artery. This appears more distally located than on the prior study. No pneumothorax Extensive bibasilar consolidation and bilateral effusions unchanged. Pulmonary vascular congestion. IMPRESSION: Endotracheal tube in good position. Swan-Ganz catheter more distally positioned in a distal branch of the right lower lobe pulmonary artery Bibasilar consolidation most likely atelectasis related to moderately large pleural effusions. This is most likely related to fluid overload and is unchanged. Electronically Signed   By: Franchot Gallo M.D.   On: 06/02/2016 07:31    Latest Echo  Latest Cath   Medications:     Scheduled Medications: . aspirin  81 mg Oral Daily  . atorvastatin  80 mg Oral q1800  . chlorhexidine gluconate (MEDLINE KIT)  15 mL Mouth Rinse BID  . Chlorhexidine Gluconate Cloth  6 each Topical  Daily  . furosemide  80 mg Intravenous BID  . hydrALAZINE  12.5 mg Oral Q8H  . insulin aspart  2-6 Units Subcutaneous Q4H  . insulin glargine  10 Units Subcutaneous Daily  . isosorbide dinitrate  5 mg Oral TID  . mouth rinse  15 mL Mouth Rinse 10 times per day  . mupirocin ointment  1 application Nasal BID  . pantoprazole sodium  40 mg Per Tube Q24H  . piperacillin-tazobactam (ZOSYN)  IV  3.375 g Intravenous Q8H  . sodium chloride flush  10-40 mL Intracatheter Q12H  . vancomycin  750 mg Intravenous Q24H    Infusions: . amiodarone 30 mg/hr (06/03/16 0700)  . feeding supplement (VITAL AF 1.2 CAL) 1,000 mL (06/03/16 0700)  .  fentaNYL infusion INTRAVENOUS 150 mcg/hr (06/03/16 0700)  . heparin 850 Units/hr (06/03/16 0700)  . propofol (DIPRIVAN) infusion 15.111 mcg/kg/min (06/03/16 0700)  .  sodium bicarbonate  infusion 1000 mL 50 mL/hr at 06/03/16 0700    PRN Medications: sodium chloride, Place/Maintain arterial line **AND** sodium chloride, acetaminophen (TYLENOL) oral liquid 160 mg/5 mL, fentaNYL, Influenza vac split quadrivalent PF, ondansetron (ZOFRAN) IV, sodium chloride flush   Assessment/Plan   CHANNING YEAGER is a 60 y.o. male with hx of poorly controlled diabetes, HTN, CKD IV, and HLD.  Admitted 05/31/16 with orthopnea, DOE, and CP.  Intubated, sedated, placed on IABP.  HF team consulted for optimization. Cardiac surgery consulted as well with 3 vessel disease.    1. Acute on chronic systolic CHF: EF 81-15% with wall motion abnormalities, ischemic cardiomyopathy.  Filling pressures are increased today, cardiac output is preserved. Creatinine up to 3.1.  Remains intubated.   - Keep IABP in place at 1:1. - Lasix 80 mg IV bid today.  - Can continue low dose hydralazine 12.5 tid + isordil 5 tid, BP stable.    2. CAD: Admitted with NSTEMI, severe 3VD.  - ASA, atorvastatin 80.  - CVTS following for CABG Friday hopefully.  3. AKI on CKD stage IV.  Creatinine up to 3.1.  Follow closely,  will need diuresis today so watch for worsening.  Cardiac output is optimized.   4. Acute respiratory failure: right thoracentesis yesterday.  Now left side looks worse.  ?PNA + pleural effusion.  Plan for right-sided thoracentesis today.  On HCO3 gtt for acidemia.  5. Anemia: Right groin IABP site stable. Hemoglobin lower today but no overt bleeding.  Repeat pm CBC. 6. ID: Temperature up to 100.6, WBCs elevated.  On vanco/Zosyn, ?PNA.  PCT higher today.  No culture data yet.     35 minutes critical care time.   Length of Stay: 3  Loralie Champagne  06/03/2016, 7:50 AM  Advanced Heart Failure Team Pager [phone] (M-F; 7a - 4p)  Please contact [facility] for night-coverage after hours (4p -7a ) and weekends on [email or web address]

## 2016-06-03 NOTE — Progress Notes (Signed)
eLink Physician-Brief Progress Note Patient Name: SHAHEER BONFIELD DOB: [birth year] MRN: [id number]   Date of Service  06/03/2016  HPI/Events of Note  Patient with worsening hypoxia, CXR shows worsening left sided opacity  eICU Interventions  Increase peep and fio2, patient already on abx(zosyn/vancomycin), will give one time dose of lasix 60 mg x 1 and monitor kidney function, may need thoracentesis on left side     Intervention Category Major Interventions: Hypoxemia - evaluation and management  Braxten Memmer 06/03/2016, 2:08 AM

## 2016-06-03 NOTE — Procedures (Signed)
Thoracentesis Procedure Note  Pre-operative Diagnosis: pleural effusion   Post-operative Diagnosis: same  Indications: vent weaning  Procedure Details  Consent: Informed consent was obtained. Risks of the procedure were discussed including: infection, bleeding, pain, pneumothorax.  Under sterile conditions the patient was positioned. Betadine solution and sterile drapes were utilized.  1% buffered lidocaine was used to anesthetize the  rib space which was identified via real time [place]. Fluid was obtained without any difficulties and minimal blood loss.  A dressing was applied to the wound and wound care instructions were provided.   Findings 600 ml of clear pleural fluid was obtained.  Complications:  None; patient tolerated the procedure well.          Erick Colace ACNP-BC [place] Pager # [phone] OR # [phone] if no answer  Chesley Mires, MD [facility] Pulmonary/Critical Care 06/03/2016, 12:21 PM Pager:  [phone] After 3pm call: [phone]

## 2016-06-03 NOTE — Progress Notes (Signed)
PULMONARY / CRITICAL CARE MEDICINE   Name: Kenneth [name] MRN: [id number] DOB: 1955-09-23    ADMISSION DATE:  05/31/2016 CONSULTATION DATE:  05/31/2016   REFERRING MD:  Dr. Dayna Barker, ER  CHIEF COMPLAINT:  Sob, resp failure  HISTORY OF PRESENT ILLNESS:   60 yo male male presented with dyspnea felt to be related to respiratory infection.  He had progressive orthopnea, chest discomfort, lower extremity edema, and respiratory failure from acute pulmonary edema with acute combined CHF.  PMHx of DM, HTN, HLD, CKD 4  SUBJECTIVE:  Increased FiO2, PEEP needs overnight.  VITAL SIGNS: BP 124/73 (BP Location: Right Arm)   Pulse 69   Temp (!) 100.6 F (38.1 C) (Core (Comment))   Resp 15   Ht 5\' 6"  (1.676 m)   Wt 159 lb 13.3 oz (72.5 kg)   SpO2 99%   BMI 25.80 kg/m   HEMODYNAMICS: PAP: (29-48)/(10-26) 48/24 CVP:  [4 mmHg-18 mmHg] 14 mmHg PCWP:  [14 mmHg-23 mmHg] 23 mmHg CO:  [0 L/min-6.2 L/min] 6.2 L/min CI:  [2.3 L/min/m2-3.4 L/min/m2] 3.4 L/min/m2  VENTILATOR SETTINGS: Vent Mode: PRVC FiO2 (%):  [40 %-80 %] 60 % Set Rate:  [14 bmp] 14 bmp Vt Set:  [500 mL] 500 mL PEEP:  [5 cmH20-8 cmH20] 8 cmH20 Plateau Pressure:  [15 cmH20-23 cmH20] 23 cmH20  INTAKE / OUTPUT: I/O last 3 completed shifts: In: 72 [I.V.:3494; NG/GT:2190; IV Piggyback:150] Out: 8185 [Urine:3975]  PHYSICAL EXAMINATION: General:  Sedated  Neuro: RASS -3 HEENT:  ETT in place Cardiovascular: regular, no murmur Lungs: crackles on Lt with decreased BS at base, Rt side clearer Abdomen:  Soft, non tender Musculoskeletal:  2+ edema Skin: cool, no rashes  LABS:  BMET  Recent Labs Lab 06/01/16 1650 06/02/16 0426 06/03/16 0500  NA 137 138 137  K 4.1 4.4 4.5  CL 111 109 104  CO2 19* 19* 23  BUN 82* 81* 85*  CREATININE 2.58* 2.59* 3.18*  GLUCOSE 127* 161* 295*    Electrolytes  Recent Labs Lab 06/01/16 0439 06/01/16 1650 06/02/16 0426 06/03/16 0500  CALCIUM 8.4* 8.1* 8.3* 7.8*  MG 1.9 1.7 1.8   --   PHOS 5.1* 5.4* 5.5*  --     CBC  Recent Labs Lab 06/01/16 0438 06/02/16 0426 06/03/16 0500  WBC 8.5 13.2* 13.6*  HGB 8.3* 9.0* 8.0*  HCT 25.6* 28.1* 25.5*  PLT 236 223 152    Coag's  Recent Labs Lab 05/31/16 1435  INR 0.96    Sepsis Markers  Recent Labs Lab 05/31/16 2345 06/01/16 0952 06/02/16 0426 06/02/16 0830 06/03/16 0500  LATICACIDVEN 1.0 0.8  --   --   --   PROCALCITON  --   --  0.12 0.11 1.06    ABG  Recent Labs Lab 06/02/16 0400 06/02/16 1318 06/03/16 0406  PHART 7.299* 7.236* 7.226*  PCO2ART 38.4 48.5* 59.9*  PO2ART 80.5* 91.0 73.1*    Liver Enzymes  Recent Labs Lab 05/31/16 1824 06/02/16 0426 06/03/16 0500  AST 53* 23 18  ALT 97* 56 36  ALKPHOS 115 109 84  BILITOT 0.6 0.2* 0.5  ALBUMIN 2.6* 2.4* 2.0*    Cardiac Enzymes  Recent Labs Lab 05/31/16 1824 05/31/16 2311 06/01/16 0439  TROPONINI 1.68* 2.46* 2.90*    Glucose  Recent Labs Lab 06/02/16 0752 06/02/16 1302 06/02/16 1805 06/02/16 1947 06/03/16 0015 06/03/16 0351  GLUCAP 179* 152* 91 183* 197* 274*    Imaging Dg Chest Port 1 View  Result Date: 06/03/2016 CLINICAL DATA:  Acute onset of oxygen desaturation. Initial encounter. EXAM: PORTABLE CHEST 1 VIEW COMPARISON:  Chest radiograph performed 06/02/2016 FINDINGS: [name] endotracheal tube is seen ending 3-4 cm above the carina. A right IJ Swan-Ganz catheter is noted ending about the right main pulmonary artery. An enteric tube is noted extending below the diaphragm. An intra-aortic balloon pump tip is noted overlying expected position. Worsening diffuse left-sided airspace opacification reflects a combination of airspace consolidation and increasing left-sided pleural effusion. A smaller right-sided pleural effusion is seen. This may reflect asymmetric pulmonary edema or pneumonia. No pneumothorax is seen. The cardiomediastinal silhouette is borderline normal in size. No acute osseous abnormalities are  seen. IMPRESSION: 1. Endotracheal tube seen ending 3-4 cm above the carina. 2. Worsening diffuse left-sided airspace opacification reflects a combination of airspace consolidation and increasing left-sided pleural effusion. Smaller right-sided pleural effusion also noted. This may reflect asymmetric pulmonary edema or pneumonia. Electronically Signed   By: Garald Balding M.D.   On: 06/03/2016 02:17   Dg Chest Port 1 View  Result Date: 06/02/2016 CLINICAL DATA:  Central line complication. EXAM: PORTABLE CHEST 1 VIEW COMPARISON:  Radiograph of same day.  Bony thorax is unremarkable. FINDINGS: Endotracheal and nasogastric tubes are unchanged in position. Distal tip of right internal jugular Swan-Ganz catheter is now in expected position of main pulmonary artery. Distal tip of aortic balloon catheter is unchanged projected over aortic arch. No pneumothorax is noted. Stable bilateral perihilar and basilar opacities are noted concerning for edema or inflammation. Probable mild associated pleural effusions are noted as well. IMPRESSION: Right internal jugular Swan-Ganz catheter tip has been slightly withdrawn, now on expected position of main pulmonary artery. Stable bilateral lung opacities are noted as described above. Other support apparatus are stable in position. Electronically Signed   By: Marijo Conception, M.D.   On: 06/02/2016 14:28   Dg Chest Port 1 View  Result Date: 06/02/2016 CLINICAL DATA:  Retraction of Swan-Ganz catheter EXAM: PORTABLE CHEST 1 VIEW COMPARISON:  06/02/2016 FINDINGS: Endotracheal tube, intra-aortic balloon pump, NG tube and right Swan-Ganz catheter remain in place, unchanged. The Swan-Ganz catheter remains in the right lower lobe pulmonary artery. Bilateral perihilar and lower lobe opacities are again noted, not significantly changed. IMPRESSION: Support devices are in stable position. No change in the location of the Swan-Ganz catheter. Continued bilateral perihilar and lower lobe  airspace opacities. Electronically Signed   By: Rolm Baptise M.D.   On: 06/02/2016 13:21   Dg Chest Port 1 View  Result Date: 06/02/2016 CLINICAL DATA:  Status post thoracentesis. EXAM: PORTABLE CHEST 1 VIEW COMPARISON:  Portable radiograph earlier today at 10:11 a.m. FINDINGS: RIGHT thoracentesis has been performed. Despite overlying telemetry leads, there is no visible RIGHT apical or RIGHT lateral pneumothorax. Marked decrease RIGHT pleural effusion. Support tubes and lines remain stable. IMPRESSION: Marked decrease RIGHT pleural effusion.  No pneumothorax. Electronically Signed   By: Staci Righter M.D.   On: 06/02/2016 11:34   Dg Chest Port 1 View  Result Date: 06/02/2016 CLINICAL DATA:  Repositioning of Swan-Ganz catheter EXAM: PORTABLE CHEST 1 VIEW COMPARISON:  06/02/2016 FINDINGS: Swan-Ganz catheter is again noted deep within the right pulmonary artery. Intra-aortic balloon pump is noted in satisfactory position. An endotracheal tube and nasogastric catheter are again seen and stable. Bilateral pleural effusions and underlying atelectasis are again present. No new focal abnormality is seen. IMPRESSION: Swan-Ganz catheter is again noted deep within the right lower lobe pulmonary artery. Tubes and lines as described above. Stable bilateral  effusions and atelectatic changes. Electronically Signed   By: Inez Catalina M.D.   On: 06/02/2016 10:18     STUDIES:  Echo 9/17 >> EF 35 to 40%, grade 2 DD [facility] 9/17 >> severe 3 vessel disease Rt thoracentesis 9/19 >> glucose 168, protein < 3, LDH 61, WBC 117  CULTURES: Blood 9/19 >> Sputum 9/19 >> Rt pleural fluid 9/19 >>   ANTIBIOTICS: Vancomycin 9/19 >> Zosyn 9/19 >>  SIGNIFICANT EVENTS: 9/17 from [facility] to [facility], [place], Cardiology consulted, IABP 9/18 TCTS consulted, a fib with RVR >> amiodarone bolus  LINES/TUBES: 9/17 ETT >>  9/17 IABP >> 9/17 Rt IJ Introducer >>   ASSESSMENT / PLAN:  PULMONARY A: Acute hypoxic respiratory failure 2nd to  acute pulmonary edema and pleural effusions. Respiratory acidosis. P:   Increase RR and Vt 9/20 Adjust PEEP/FiO2 to keep SpO2 > 92% F/u CXR, ABG Plan for Lt thoracentesis 9/20  CARDIOVASCULAR A:  Cardiogenic shock. Acute combined CHF. 3 vessel CAD. A fib with RVR >> better 9/19 P:  Continue ASA, lipitor IABP, PA catheter per cardiology Heparin gtt per cardiology >> on hold for thoracentesis Hydralazine, lasix, isordil, amiodarone per cardiology TCTS planning for CABG later this week  RENAL A:   CKD 4 >> baseline creatinine 2.57 from 05/21/16. P:   Monitor renal fx, urine outpt D/c HCO3 gtt and KVO IV fluids 9/20  GASTROINTESTINAL A:   Nutrition. P:   Tube feeds Protonix for SUP  HEMATOLOGIC A:   Anemia of critical illness, chronic disease. P:  F/u CBC  INFECTIOUS A:   Low grade temperature 9/19. P:   Abx started 9/19   ENDOCRINE A:   DM. P:   SSI with lantus  NEUROLOGIC A:   Acute metabolic encephalopathy. P:   RASS goal: 0 to -1  D/w Dr. Aundra Dubin and Dr. Prescott Gum.  CC time 32 minutes.  Chesley Mires, MD [facility] Pulmonary/Critical Care 06/03/2016, 7:57 AM Pager:  [phone] After 3pm call: [phone]

## 2016-06-03 NOTE — Progress Notes (Signed)
ANTICOAGULATION f/u [facility] [place] for Heparin Indication: chest pain/ACS; now with IABP in place  Assessment: 60 year old male s/p cath which showed severe 3V CAD, IABP placed in cath lab. Plan for CABG when pt stable. Per TCTS, pt will likely need IABP until CABG.  Heparin level therapeutic (0.33) on gtt at 850 units/hr. No issues with line or bleeding reported per RN. CBC stable.   Thoracentesis done this morning, no complications noted. Heparin to restart 1 hour after procedure, level has been stable will not recheck level tonight.  Goal of Therapy:  Heparin level 0.2-0.5 on IABP Monitor platelets by anticoagulation protocol: Yes   Plan:  1. Continue IV heparin to 850 units/hr 2. Daily heparin level and CBC  No Known Allergies  Patient Measurements: Height: 5\' 6"  (167.6 cm) Weight: 159 lb 13.3 oz (72.5 kg) IBW/kg (Calculated) : 63.8 Heparin Dosing Weight: 63.5 kg  Vital Signs: Temp: 98.8 F (37.1 C) (09/20 1200) Temp Source: Core (Comment) (09/20 1200) BP: 157/43 (09/20 1300) Pulse Rate: 57 (09/20 1200)  Labs:  Recent Labs  05/31/16 1435 05/31/16 1824 05/31/16 2311  06/01/16 0438 06/01/16 0439 06/01/16 1650 06/02/16 0426 06/02/16 0427 06/02/16 2051 06/03/16 0434 06/03/16 0500  HGB 10.1*  --   --   --  8.3*  --   --  9.0*  --   --   --  8.0*  HCT 30.7*  --   --   --  25.6*  --   --  28.1*  --   --   --  25.5*  PLT 326  --   --   --  236  --   --  223  --   --   --  152  LABPROT 12.8  --   --   --   --   --   --   --   --   --   --   --   INR 0.96  --   --   --   --   --   --   --   --   --   --   --   HEPARINUNFRC  --   --   --   < >  --   --   --   --  0.15* 0.27* 0.33  --   CREATININE 2.69* 2.84*  --   --   --  2.66* 2.58* 2.59*  --   --   --  3.18*  TROPONINI 1.43* 1.68* 2.46*  --   --  2.90*  --   --   --   --   --   --   < > = values in this interval not displayed. Estimated Creatinine Clearance: 22.8 mL/min (by C-G formula based on  SCr of 3.18 mg/dL (H)).  Medical History: No past medical history on file.    [name], PharmD Clinical Pharmacist Pager: [phone] 06/03/2016 1:55 PM

## 2016-06-03 NOTE — Progress Notes (Signed)
Inpatient Diabetes Program Recommendations  AACE/ADA: New Consensus Statement on Inpatient Glycemic Control (2015)  Target Ranges:  Prepandial:   less than 140 mg/dL      Peak postprandial:   less than 180 mg/dL (1-2 hours)      Critically ill patients:  140 - 180 mg/dL   Lab Results  Component Value Date   GLUCAP 205 (H) 06/03/2016    Review of Glycemic Control  Diabetes history: DM1 Outpatient Diabetes medications: Insulin pump Current orders for Inpatient glycemic control: IV insulin  Inpatient Diabetes Program Recommendations:    Spoke with patient's son @ phone # [phone] to inquire of insulin pump settings. Spoke with Dr. Collie Siad office [phone] and received information on insulin pump settings. Basal rate 0.9 units per hr. Carbohydrate coverage:   Breakfast 1 unit: 12    Lunch       1:10    Dinner       1:10 Correction factor 41  Will follow.  Thank you, Nani Gasser. Daneka Lantigua, RN, MSN, CDE Inpatient Glycemic Control Team Team Pager [phone] (8am-5pm) 06/03/2016 3:15 PM

## 2016-06-04 ENCOUNTER — Encounter (HOSPITAL_COMMUNITY): Payer: BLUE CROSS/BLUE SHIELD

## 2016-06-04 ENCOUNTER — Inpatient Hospital Stay (HOSPITAL_COMMUNITY): Payer: BLUE CROSS/BLUE SHIELD

## 2016-06-04 DIAGNOSIS — I5021 Acute systolic (congestive) heart failure: Secondary | ICD-10-CM

## 2016-06-04 DIAGNOSIS — J9602 Acute respiratory failure with hypercapnia: Secondary | ICD-10-CM

## 2016-06-04 DIAGNOSIS — I214 Non-ST elevation (NSTEMI) myocardial infarction: Principal | ICD-10-CM

## 2016-06-04 DIAGNOSIS — I25110 Atherosclerotic heart disease of native coronary artery with unstable angina pectoris: Secondary | ICD-10-CM

## 2016-06-04 LAB — COMPREHENSIVE METABOLIC PANEL
ALT: 33 U/L (ref 17–63)
AST: 30 U/L (ref 15–41)
Albumin: 1.6 g/dL — ABNORMAL LOW (ref 3.5–5.0)
Alkaline Phosphatase: 93 U/L (ref 38–126)
Anion gap: 11 (ref 5–15)
BUN: 89 mg/dL — ABNORMAL HIGH (ref 6–20)
CO2: 24 mmol/L (ref 22–32)
Calcium: 7.5 mg/dL — ABNORMAL LOW (ref 8.9–10.3)
Chloride: 103 mmol/L (ref 101–111)
Creatinine, Ser: 3.57 mg/dL — ABNORMAL HIGH (ref 0.61–1.24)
GFR calc Af Amer: 20 mL/min — ABNORMAL LOW (ref >60)
GFR calc non Af Amer: 17 mL/min — ABNORMAL LOW (ref >60)
Glucose, Bld: 238 mg/dL — ABNORMAL HIGH (ref 65–99)
Potassium: 3.6 mmol/L (ref 3.5–5.1)
Sodium: 138 mmol/L (ref 135–145)
Total Bilirubin: 0.7 mg/dL (ref 0.3–1.2)
Total Protein: 4.2 g/dL — ABNORMAL LOW (ref 6.5–8.1)

## 2016-06-04 LAB — GLUCOSE, CAPILLARY
GLUCOSE-CAPILLARY: 129 mg/dL — AB (ref 65–99)
GLUCOSE-CAPILLARY: 137 mg/dL — AB (ref 65–99)
GLUCOSE-CAPILLARY: 139 mg/dL — AB (ref 65–99)
GLUCOSE-CAPILLARY: 147 mg/dL — AB (ref 65–99)
GLUCOSE-CAPILLARY: 164 mg/dL — AB (ref 65–99)
GLUCOSE-CAPILLARY: 171 mg/dL — AB (ref 65–99)
GLUCOSE-CAPILLARY: 171 mg/dL — AB (ref 65–99)
GLUCOSE-CAPILLARY: 174 mg/dL — AB (ref 65–99)
GLUCOSE-CAPILLARY: 177 mg/dL — AB (ref 65–99)
GLUCOSE-CAPILLARY: 200 mg/dL — AB (ref 65–99)
GLUCOSE-CAPILLARY: 203 mg/dL — AB (ref 65–99)
Glucose-Capillary: 108 mg/dL — ABNORMAL HIGH (ref 65–99)
Glucose-Capillary: 113 mg/dL — ABNORMAL HIGH (ref 65–99)
Glucose-Capillary: 125 mg/dL — ABNORMAL HIGH (ref 65–99)
Glucose-Capillary: 125 mg/dL — ABNORMAL HIGH (ref 65–99)
Glucose-Capillary: 129 mg/dL — ABNORMAL HIGH (ref 65–99)
Glucose-Capillary: 138 mg/dL — ABNORMAL HIGH (ref 65–99)
Glucose-Capillary: 155 mg/dL — ABNORMAL HIGH (ref 65–99)
Glucose-Capillary: 176 mg/dL — ABNORMAL HIGH (ref 65–99)
Glucose-Capillary: 181 mg/dL — ABNORMAL HIGH (ref 65–99)
Glucose-Capillary: 190 mg/dL — ABNORMAL HIGH (ref 65–99)
Glucose-Capillary: 201 mg/dL — ABNORMAL HIGH (ref 65–99)
Glucose-Capillary: 211 mg/dL — ABNORMAL HIGH (ref 65–99)
Glucose-Capillary: 216 mg/dL — ABNORMAL HIGH (ref 65–99)
Glucose-Capillary: 217 mg/dL — ABNORMAL HIGH (ref 65–99)

## 2016-06-04 LAB — BLOOD GAS, ARTERIAL
Acid-base deficit: 1 mmol/L (ref 0.0–2.0)
Acid-base deficit: 2.6 mmol/L — ABNORMAL HIGH (ref 0.0–2.0)
Bicarbonate: 23.1 mmol/L (ref 20.0–28.0)
Bicarbonate: 22 mmol/L (ref 20.0–28.0)
Drawn by: 40415
Drawn by: 44135
FIO2: 40
FIO2: 40
MECHVT: 520 mL
MECHVT: 520 mL
O2 Saturation: 98.7 %
O2 Saturation: 96.8 %
PEEP: 8 cmH2O
PEEP: 8 cmH2O
Patient temperature: 98.6
Patient temperature: 98.6
RATE: 20 resp/min
RATE: 18 resp/min
pCO2 arterial: 37.6 mmHg (ref 32.0–48.0)
pCO2 arterial: 40.1 mmHg (ref 32.0–48.0)
pH, Arterial: 7.404 (ref 7.350–7.450)
pH, Arterial: 7.358 (ref 7.350–7.450)
pO2, Arterial: 145 mmHg — ABNORMAL HIGH (ref 83.0–108.0)
pO2, Arterial: 96.4 mmHg (ref 83.0–108.0)

## 2016-06-04 LAB — PREPARE RBC (CROSSMATCH)

## 2016-06-04 LAB — CBC
HCT: 22.5 % — ABNORMAL LOW (ref 39.0–52.0)
HEMOGLOBIN: 7.2 g/dL — AB (ref 13.0–17.0)
MCH: 29.4 pg (ref 26.0–34.0)
MCHC: 32 g/dL (ref 30.0–36.0)
MCV: 91.8 fL (ref 78.0–100.0)
Platelets: 108 10*3/uL — ABNORMAL LOW (ref 150–400)
RBC: 2.45 MIL/uL — ABNORMAL LOW (ref 4.22–5.81)
RDW: 14 % (ref 11.5–15.5)
WBC: 9.4 10*3/uL (ref 4.0–10.5)

## 2016-06-04 LAB — HEPARIN LEVEL (UNFRACTIONATED): Heparin Unfractionated: 0.27 IU/mL — ABNORMAL LOW (ref 0.30–0.70)

## 2016-06-04 LAB — CULTURE, RESPIRATORY W GRAM STAIN: Special Requests: NORMAL

## 2016-06-04 LAB — CARBOXYHEMOGLOBIN
Carboxyhemoglobin: 0.8 % (ref 0.5–1.5)
Carboxyhemoglobin: 0.7 % (ref 0.5–1.5)
Methemoglobin: 1.5 % (ref 0.0–1.5)
Methemoglobin: 1.2 % (ref 0.0–1.5)
O2 Saturation: 53.9 %
O2 Saturation: 63.9 %
Total hemoglobin: 7.2 g/dL — ABNORMAL LOW (ref 12.0–16.0)
Total hemoglobin: 8.2 g/dL — ABNORMAL LOW (ref 12.0–16.0)

## 2016-06-04 LAB — CHOLESTEROL, BODY FLUID: Cholesterol, Fluid: 16 mg/dL

## 2016-06-04 LAB — PROCALCITONIN: PROCALCITONIN: 1.13 ng/mL

## 2016-06-04 MED ORDER — MAGNESIUM SULFATE 50 % IJ SOLN
40.0000 meq | INTRAMUSCULAR | Status: DC
  Filled 2016-06-04: qty 10

## 2016-06-04 MED ORDER — PLASMA-LYTE 148 IV SOLN
INTRAVENOUS | Status: AC
  Administered 2016-06-05: 500 mL
  Filled 2016-06-04 (×2): qty 2.5

## 2016-06-04 MED ORDER — CHLORHEXIDINE GLUCONATE CLOTH 2 % EX PADS
6.0000 | MEDICATED_PAD | Freq: Once | CUTANEOUS | Status: AC
Start: 2016-06-05 — End: 2016-06-05
  Administered 2016-06-05: 6 via TOPICAL

## 2016-06-04 MED ORDER — DEXTROSE 5 % IV SOLN
750.0000 mg | INTRAVENOUS | Status: DC
  Filled 2016-06-04: qty 750

## 2016-06-04 MED ORDER — DEXTROSE 5 % IV SOLN
1.5000 g | INTRAVENOUS | Status: AC
  Administered 2016-06-05: 1.5 g via INTRAVENOUS
  Administered 2016-06-05: .75 g via INTRAVENOUS
  Filled 2016-06-04: qty 1.5

## 2016-06-04 MED ORDER — INSULIN REGULAR HUMAN 100 UNIT/ML IJ SOLN
INTRAMUSCULAR | Status: AC
  Administered 2016-06-05: .4 [IU]/h via INTRAVENOUS
  Filled 2016-06-04: qty 2.5

## 2016-06-04 MED ORDER — METOPROLOL TARTRATE 12.5 MG HALF TABLET
12.5000 mg | ORAL_TABLET | Freq: Once | ORAL | Status: AC
  Administered 2016-06-05: 12.5 mg via ORAL
  Filled 2016-06-04: qty 1

## 2016-06-04 MED ORDER — SODIUM CHLORIDE 0.9 % IV SOLN
INTRAVENOUS | Status: AC
  Administered 2016-06-05: 69.8 mL/h via INTRAVENOUS
  Filled 2016-06-04: qty 40

## 2016-06-04 MED ORDER — BISACODYL 5 MG PO TBEC: 5.0000 mg | DELAYED_RELEASE_TABLET | Freq: Once | ORAL | Status: DC

## 2016-06-04 MED ORDER — DEXMEDETOMIDINE HCL IN NACL 400 MCG/100ML IV SOLN
0.1000 ug/kg/h | INTRAVENOUS | Status: AC
  Administered 2016-06-05: 0.7 ug/kg/h via INTRAVENOUS
  Filled 2016-06-04: qty 100

## 2016-06-04 MED ORDER — NITROGLYCERIN IN D5W 200-5 MCG/ML-% IV SOLN
2.0000 ug/min | INTRAVENOUS | Status: AC
  Administered 2016-06-05: 5 ug/min via INTRAVENOUS
  Filled 2016-06-04: qty 250

## 2016-06-04 MED ORDER — DOPAMINE-DEXTROSE 3.2-5 MG/ML-% IV SOLN
0.0000 ug/kg/min | INTRAVENOUS | Status: DC
  Filled 2016-06-04: qty 250

## 2016-06-04 MED ORDER — SODIUM CHLORIDE 0.9 % IV SOLN
INTRAVENOUS | Status: DC
  Filled 2016-06-04: qty 30

## 2016-06-04 MED ORDER — SODIUM CHLORIDE 0.9 % IV SOLN: Freq: Once | INTRAVENOUS | Status: DC

## 2016-06-04 MED ORDER — PHENYLEPHRINE HCL 10 MG/ML IJ SOLN
30.0000 ug/min | INTRAVENOUS | Status: DC
  Filled 2016-06-04: qty 2

## 2016-06-04 MED ORDER — POTASSIUM CHLORIDE 2 MEQ/ML IV SOLN
80.0000 meq | INTRAVENOUS | Status: DC
  Filled 2016-06-04: qty 40

## 2016-06-04 MED ORDER — VANCOMYCIN HCL 10 G IV SOLR
1250.0000 mg | INTRAVENOUS | Status: AC
  Administered 2016-06-05: 1250 mg via INTRAVENOUS
  Filled 2016-06-04: qty 1250

## 2016-06-04 MED ORDER — CHLORHEXIDINE GLUCONATE 0.12 % MT SOLN
15.0000 mL | Freq: Once | OROMUCOSAL | Status: AC
  Administered 2016-06-05: 15 mL via OROMUCOSAL
  Filled 2016-06-04: qty 15

## 2016-06-04 MED ORDER — SODIUM CHLORIDE 0.9 % IV SOLN
INTRAVENOUS | Status: DC
  Administered 2016-06-04: 1.3 [IU]/h via INTRAVENOUS
  Administered 2016-06-05: 1.2 [IU]/h via INTRAVENOUS
  Filled 2016-06-04 (×2): qty 2.5

## 2016-06-04 MED ORDER — EPINEPHRINE HCL 1 MG/ML IJ SOLN
0.0000 ug/min | INTRAVENOUS | Status: DC
  Filled 2016-06-04 (×2): qty 4

## 2016-06-04 MED ORDER — TEMAZEPAM 15 MG PO CAPS: 15.0000 mg | ORAL_CAPSULE | Freq: Once | ORAL | Status: DC | PRN

## 2016-06-04 NOTE — Progress Notes (Signed)
ANTICOAGULATION f/u [place] for Heparin Indication: chest pain/ACS; now with IABP in place  Assessment: 60 year old male s/p cath which showed severe 3V CAD, IABP placed in cath lab. Plan for CABG when pt stable. Per TCTS, pt will likely need IABP until CABG.  Heparin level therapeutic on gtt at 850 units/hr. No issues with line or bleeding reported per RN. CBC stable.   Goal of Therapy:  Heparin level 0.2-0.5 on IABP Monitor platelets by anticoagulation protocol: Yes   Plan:  1. Continue IV heparin to 850 units/hr 2. Daily heparin level and CBC 3. Surgery in am  Allergies  Allergen Reactions  . No Known Allergies     Patient Measurements: Height: 5\' 6"  (167.6 cm) Weight: 166 lb 10.7 oz (75.6 kg) IBW/kg (Calculated) : 63.8 Heparin Dosing Weight: 63.5 kg  Vital Signs: Temp: 99.5 F (37.5 C) (09/21 1200) Temp Source: Core (Comment) (09/21 1220) BP: 144/59 (09/21 1220) Pulse Rate: 76 (09/21 1200)  Labs:  Recent Labs  06/02/16 0426  06/02/16 2051 06/03/16 0434 06/03/16 0500 06/03/16 1609 06/04/16 0453  HGB 9.0*  --   --   --  8.0* 7.3* 7.2*  HCT 28.1*  --   --   --  25.5* 22.6* 22.5*  PLT 223  --   --   --  152 125* 108*  HEPARINUNFRC  --   < > 0.27* 0.33  --   --  0.27*  CREATININE 2.59*  --   --   --  3.18*  --  3.57*  < > = values in this interval not displayed. Estimated Creatinine Clearance: 20.4 mL/min (by C-G formula based on SCr of 3.57 mg/dL (H)).  Medical History: No past medical history on file.   Erin Hearing PharmD., BCPS Clinical Pharmacist Pager [phone] 06/04/2016 1:03 PM

## 2016-06-04 NOTE — Progress Notes (Addendum)
Inpatient Diabetes Program Recommendations  AACE/ADA: New Consensus Statement on Inpatient Glycemic Control (2015)  Target Ranges:  Prepandial:   less than 140 mg/dL      Peak postprandial:   less than 180 mg/dL (1-2 hours)      Critically ill patients:  140 - 180 mg/dL   Lab Results  Component Value Date   GLUCAP 129 (H) 06/04/2016    Review of Glycemic Control  Inpatient Diabetes Program Recommendations:  Spoke with Dr. Halford Chessman concerning plans for patient regarding IV insulin per glucostabilizer. Received orders to continue IV insulin per glucostabilizer and plans for surgery in am. Reviewed plans with RN Lavella Lemons regarding insulin management.   Son brought patient's insulin pump from home and verified settings compared with settings received from Dr. Collie Siad office yesterday. Total basal insulin dose =21.6 units/24 hrs. Target CBG=100-120 Active insulin time = 4 hrs. Carbohydrate Coverage: 12A-11A    1:12 11A-12A     1:10 Correction 1:41 (1 unit will decrease CBG 41 points)   Thank you, Navdeep Halt E. Ayo Smoak, RN, MSN, CDE Inpatient Glycemic Control Team Team Pager [phone] (8am-5pm) 06/04/2016 10:10 AM

## 2016-06-04 NOTE — Progress Notes (Signed)
PULMONARY / CRITICAL CARE MEDICINE   Name: Kenneth [name] MRN: [id number] DOB: 1956/04/19    ADMISSION DATE:  05/31/2016 CONSULTATION DATE:  05/31/2016   REFERRING MD:  Dr. Dayna Barker, ER  CHIEF COMPLAINT:  Sob, resp failure  HISTORY OF PRESENT ILLNESS:   60 yo male male presented with dyspnea felt to be related to respiratory infection.  He had progressive orthopnea, chest discomfort, lower extremity edema, and respiratory failure from acute pulmonary edema with acute combined CHF.  PMHx of DM, HTN, HLD, CKD 4  SUBJECTIVE:  O2 needs better.  Getting unit PRBC.  VITAL SIGNS: BP (!) 137/49   Pulse (!) 51   Temp 98.2 F (36.8 C)   Resp (!) 21   Ht 5\' 6"  (1.676 m)   Wt 166 lb 10.7 oz (75.6 kg)   SpO2 100%   BMI 26.90 kg/m   HEMODYNAMICS: PAP: (29-49)/(11-26) 33/14 CVP:  [4 mmHg-15 mmHg] 6 mmHg PCWP:  [13 mmHg-18 mmHg] 16 mmHg CO:  [4.6 L/min-5.5 L/min] 4.8 L/min CI:  [2.6 L/min/m2-3 L/min/m2] 2.7 L/min/m2  VENTILATOR SETTINGS: Vent Mode: PRVC FiO2 (%):  [40 %-50 %] 40 % Set Rate:  [14 bmp-20 bmp] 20 bmp Vt Set:  [500 mL-520 mL] 520 mL PEEP:  [5 cmH20-8 cmH20] 5 cmH20 Plateau Pressure:  [19 cmH20-22 cmH20] 22 cmH20  INTAKE / OUTPUT: I/O last 3 completed shifts: In: 6695 [I.V.:3705; NG/GT:2590; IV Piggyback:400] Out: 2530 [Urine:2530]  PHYSICAL EXAMINATION: General:  Sedated  Neuro: RASS -3 HEENT:  ETT in place Cardiovascular: regular, no murmur Lungs: better air movement, no wheeze Abdomen:  Soft, non tender Musculoskeletal:  2+ edema Skin: cool, no rashes  LABS:  BMET  Recent Labs Lab 06/02/16 0426 06/03/16 0500 06/04/16 0453  NA 138 137 138  K 4.4 4.5 3.6  CL 109 104 103  CO2 19* 23 24  BUN 81* 85* 89*  CREATININE 2.59* 3.18* 3.57*  GLUCOSE 161* 295* 238*    Electrolytes  Recent Labs Lab 06/01/16 0439 06/01/16 1650 06/02/16 0426 06/03/16 0500 06/04/16 0453  CALCIUM 8.4* 8.1* 8.3* 7.8* 7.5*  MG 1.9 1.7 1.8  --   --   PHOS 5.1* 5.4* 5.5*   --   --     CBC  Recent Labs Lab 06/03/16 0500 06/03/16 1609 06/04/16 0453  WBC 13.6* 9.0 9.4  HGB 8.0* 7.3* 7.2*  HCT 25.5* 22.6* 22.5*  PLT 152 125* 108*    Coag's  Recent Labs Lab 05/31/16 1435  INR 0.96    Sepsis Markers  Recent Labs Lab 05/31/16 2345 06/01/16 0952  06/02/16 0830 06/03/16 0500 06/04/16 0453  LATICACIDVEN 1.0 0.8  --   --   --   --   PROCALCITON  --   --   < > 0.11 1.06 1.13  < > = values in this interval not displayed.  ABG  Recent Labs Lab 06/02/16 1318 06/03/16 0406 06/04/16 0410  PHART 7.236* 7.226* 7.404  PCO2ART 48.5* 59.9* 37.6  PO2ART 91.0 73.1* 145*    Liver Enzymes  Recent Labs Lab 06/02/16 0426 06/03/16 0500 06/04/16 0453  AST 23 18 30   ALT 56 36 33  ALKPHOS 109 84 93  BILITOT 0.2* 0.5 0.7  ALBUMIN 2.4* 2.0* 1.6*    Cardiac Enzymes  Recent Labs Lab 05/31/16 1824 05/31/16 2311 06/01/16 0439  TROPONINI 1.68* 2.46* 2.90*    Glucose  Recent Labs Lab 06/04/16 0304 06/04/16 0404 06/04/16 0503 06/04/16 0606 06/04/16 0658 06/04/16 0752  GLUCAP 171* 216* 217*  211* 176* 164*    Imaging Dg Chest Port 1 View  Result Date: 06/04/2016 CLINICAL DATA:  Respiratory failure. EXAM: PORTABLE CHEST 1 VIEW COMPARISON:  Radiograph of June 03, 2016. FINDINGS: Stable cardiomediastinal silhouette. Endotracheal and nasogastric tubes are unchanged in position. Right internal jugular Swan-Ganz catheter is unchanged with distal tip in expected position of main pulmonary artery. Distal tip of aortic balloon catheter is unchanged in expected position of aortic arch. Atherosclerosis of thoracic aorta is noted. No pneumothorax is noted. Bibasilar opacities are noted concerning for atelectasis or edema with probable mild right pleural effusion. Bony thorax is unremarkable. IMPRESSION: Stable support apparatus. Stable bibasilar edema or atelectasis is noted with probable mild right pleural effusion. Electronically Signed   By:  Marijo Conception, M.D.   On: 06/04/2016 08:21   Dg Chest Port 1 View  Result Date: 06/03/2016 CLINICAL DATA:  Post procedure film for left-sided thoracentesis EXAM: PORTABLE CHEST 1 VIEW COMPARISON:  06/03/2016 FINDINGS: ET tube tip is above the carina. There is a Swan-Ganz catheter with tip in the right ventricular outflow tract. Nasogastric tube tip is identified with side port below GE junction. The intra-aortic balloon pump is identified at the approximate T6 level. Heart size is normal. There has been interval improved aeration to the entire left lung status post thoracentesis. No significant pneumothorax identified. Thickening of the minor fissure within the right midlung is noted. Persistent airspace opacities within the right base. IMPRESSION: Decrease in volume of left pleural effusion with improved aeration to the entire left lung status post thoracentesis. No pneumothorax. Electronically Signed   By: Kerby Moors M.D.   On: 06/03/2016 12:22     STUDIES:  Echo 9/17 >> EF 35 to 40%, grade 2 DD [facility] 9/17 >> severe 3 vessel disease Rt thoracentesis 9/19 >> glucose 168, protein < 3, LDH 61, WBC 117  CULTURES: Blood 9/19 >> Sputum 9/19 >> Few Serratia marcescens Rt pleural fluid 9/19 >>   ANTIBIOTICS: Vancomycin 9/19 >> 9/21 Zosyn 9/19 >>  SIGNIFICANT EVENTS: 9/17 from [facility] to [facility], [place], Cardiology consulted, IABP 9/18 TCTS consulted, a fib with RVR >> amiodarone bolus  LINES/TUBES: 9/17 ETT >>  9/17 IABP >> 9/17 Rt IJ Introducer >>   ASSESSMENT / PLAN:  PULMONARY A: Acute hypoxic respiratory failure 2nd to acute pulmonary edema, pleural effusions, pneumonia >> oxygenation improved after b/l thoracentesis Respiratory acidosis. P:   Full vent support Adjust PEEP/FiO2 to keep SpO2 > 92% F/u CXR, ABG  CARDIOVASCULAR A:  Cardiogenic shock. Acute combined CHF. 3 vessel CAD. A fib with RVR >> better 9/19 P:  Continue ASA, lipitor IABP, PA catheter per  cardiology Heparin gtt per cardiology >> on hold for thoracentesis Hydralazine, lasix, isordil, amiodarone per cardiology TCTS planning for CABG 9/22  RENAL A:   CKD 4 >> baseline creatinine 2.57 from 05/21/16. P:   Monitor renal fx, urine outpt  GASTROINTESTINAL A:   Nutrition. P:   Tube feeds Protonix for SUP  HEMATOLOGIC A:   Anemia of critical illness, chronic disease. P:  F/u CBC F/u Hb after PRBC transfusion 9/21  INFECTIOUS A:   Pneumonia with Serratia in sputum cx. P:   Day 3 of zosyn Will d/c vancomycin  ENDOCRINE A:   DM. P:   Insulin gtt  NEUROLOGIC A:   Acute metabolic encephalopathy. P:   RASS goal: 0 to -1  CC time 31 minutes.  Chesley Mires, MD [facility] Pulmonary/Critical Care 06/04/2016, 8:53 AM Pager:  [phone] After 3pm  call: [phone]

## 2016-06-04 NOTE — Progress Notes (Signed)
4 Days Post-Op Procedure(s) (LRB): Left Heart Cath and Coronary Angiography (N/A) Right Heart Cath (N/A) IABP Insertion (N/A) Subjective: Low grade fever 38.0 Sputum + serratia on Zosyn BP stable  Received 1 u PRBC for Hb 7.2 For CABG tomorrow unless fever is worse  Objective: Vital signs in last 24 hours: Temp:  [98.2 F (36.8 C)-100.6 F (38.1 C)] 100.6 F (38.1 C) (09/21 1400) Pulse Rate:  [50-80] 69 (09/21 1400) Cardiac Rhythm: Normal sinus rhythm;Sinus bradycardia (09/21 0800) Resp:  [15-28] 21 (09/21 1400) BP: (118-146)/(37-92) 138/46 (09/21 1456) SpO2:  [90 %-100 %] 95 % (09/21 1510) Arterial Line BP: (104-211)/(32-71) 169/56 (09/21 1400) FiO2 (%):  [40 %-50 %] 50 % (09/21 1510) Weight:  [166 lb 10.7 oz (75.6 kg)] 166 lb 10.7 oz (75.6 kg) (09/21 0500)  Hemodynamic parameters for last 24 hours: PAP: (29-47)/(11-27) 33/12 CVP:  [2 mmHg-14 mmHg] 2 mmHg PCWP:  [13 mmHg-18 mmHg] 13 mmHg CO:  [4.6 L/min-8.2 L/min] 8.2 L/min CI:  [2.6 L/min/m2-3 L/min/m2] 2.7 L/min/m2  Intake/Output from previous day: 09/20 0701 - 09/21 0700 In: 3845.9 [I.V.:1825.9; NG/GT:1720; IV Piggyback:300] Out: 3291 [Urine:1690] Intake/Output this shift: Total I/O In: 1167.1 [I.V.:527.1; NG/GT:640] Out: 775 [Urine:775]  sedated on vent Coarse breath sounds   Lab Results:  Recent Labs  06/03/16 1609 06/04/16 0453  WBC 9.0 9.4  HGB 7.3* 7.2*  HCT 22.6* 22.5*  PLT 125* 108*   BMET:  Recent Labs  06/03/16 0500 06/04/16 0453  NA 137 138  K 4.5 3.6  CL 104 103  CO2 23 24  GLUCOSE 295* 238*  BUN 85* 89*  CREATININE 3.18* 3.57*  CALCIUM 7.8* 7.5*    PT/INR: No results for input(s): LABPROT, INR in the last 72 hours. ABG    Component Value Date/Time   PHART 7.404 06/04/2016 0410   HCO3 23.1 06/04/2016 0410   TCO2 22 06/02/2016 1318   ACIDBASEDEF 1.0 06/04/2016 0410   O2SAT 63.9 06/04/2016 0820   CBG (last 3)   Recent Labs  06/04/16 1346 06/04/16 1455 06/04/16 1539   GLUCAP 200* 181* 174*    Assessment/Plan: S/P Procedure(s) (LRB): Left Heart Cath and Coronary Angiography (N/A) Right Heart Cath (N/A) IABP Insertion (N/A) CABG tomorrow mid day   LOS: 4 days    Kenneth [name] 06/04/2016

## 2016-06-04 NOTE — Progress Notes (Signed)
Patient ID: KAELUM KISSICK, male   DOB: 06-25-1956, 60 y.o.   MRN: [id number]     Advanced Heart Failure Rounding Note  Referring Physician: Raylene Miyamoto, MD  Primary Physician: Rodena Medin, MD Primary HF: New ( Dr Aundra Dubin)  Reason for Consultation: Acute respiratory failure  Subjective:    JADRIAN BULMAN is a 60 y.o. male with hx of poorly controlled diabetes, HTN, CKD IV, and HLD.  Admitted 05/31/16 with orthopnea, DOE, and CP. EKG did not meet stemi criteria.  Troponin 1.4. Echo showed global LV dysfunction. Started on NIMV for acute respiratory failure, failed requiring intubation.   Echo 05/31/16 LVEF35-40%, Grade 2 DD  LHC 05/31/16 showed LM lesion, 40 %stenosed, Ramus lesion, 80 %stenosed, Ost Cx to Prox Cx lesion, 80 %stenosed, Mid LAD lesion, 90 %stenosed, Ost 2nd Diag to 2nd Diag lesion, 90 %stenosed, Prox RCA to Mid RCA lesion, 100 %stenosed.  IABP inserted. Cardiac surgery consulted, as well as CHF team.   Atrial fibrillation with RVR developed 9/18, now on amiodarone gtt and in NSR.   Thoracentesis on right on 9/19, looks transudative (1000 cc off).   Thoracentesis on left on 9/21, 600 cc off.   Remains intubated/sedated and on IABP at 1:1 support this am. CXR improved on left.  Afebrile today.    Hemoglobin down to 7.2, creatinine up to 3.57.  I>>O yesterday.  Swan numbers this morning CVP 6 PA 35/15 Mean PCWP 16 CI 2.7 Co-ox 54%  Objective:   Weight Range: 166 lb 10.7 oz (75.6 kg) Body mass index is 26.9 kg/m.   Vital Signs:   Temp:  [98.2 F (36.8 C)-100.8 F (38.2 C)] 98.2 F (36.8 C) (09/21 0700) Pulse Rate:  [50-68] 51 (09/21 0700) Resp:  [18-28] 21 (09/21 0700) BP: (110-157)/(37-57) 137/49 (09/21 0700) SpO2:  [90 %-100 %] 100 % (09/21 0700) Arterial Line BP: (104-155)/(32-51) 123/47 (09/21 0700) FiO2 (%):  [40 %-50 %] 40 % (09/21 0411) Weight:  [166 lb 10.7 oz (75.6 kg)] 166 lb 10.7 oz (75.6 kg) (09/21 0500) Last BM Date:  (PTA)  Weight  change: Filed Weights   06/02/16 0400 06/03/16 0300 06/04/16 0500  Weight: 158 lb 11.7 oz (72 kg) 159 lb 13.3 oz (72.5 kg) 166 lb 10.7 oz (75.6 kg)    Intake/Output:   Intake/Output Summary (Last 24 hours) at 06/04/16 0740 Last data filed at 06/04/16 0700  Gross per 24 hour  Intake          3845.93 ml  Output             1690 ml  Net          2155.93 ml     Physical Exam: CVP 6 General:  Intubated and sedated HEENT: ET tube + [place] Neck: supple. JVP not elevated. Carotids 2+ bilat; no bruits. No thyromegaly or nodule noted.  Cor: PMI nondisplaced. Regular rate & rhythm. No rubs, gallops or murmurs. Lungs: Decreased breath sounds on left.   Abdomen: soft, ND, no HSM. No bruits or masses. +BS  Extremities: no cyanosis, clubbing, rash. 1-2+ ankle edema. R groin IABP stable.  Neuro: alert & orientedx3, cranial nerves grossly intact. moves all 4 extremities w/o difficulty. Affect pleasant  Telemetry: Reviewed, NSR  Labs: CBC  Recent Labs  06/03/16 1609 06/04/16 0453  WBC 9.0 9.4  HGB 7.3* 7.2*  HCT 22.6* 22.5*  MCV 90.4 91.8  PLT 125* 403*   Basic Metabolic Panel  Recent Labs  06/01/16 1650 06/02/16  0426 06/03/16 0500 06/04/16 0453  NA 137 138 137 138  K 4.1 4.4 4.5 3.6  CL 111 109 104 103  CO2 19* 19* 23 24  GLUCOSE 127* 161* 295* 238*  BUN 82* 81* 85* 89*  CREATININE 2.58* 2.59* 3.18* 3.57*  CALCIUM 8.1* 8.3* 7.8* 7.5*  MG 1.7 1.8  --   --   PHOS 5.4* 5.5*  --   --    Liver Function Tests  Recent Labs  06/03/16 0500 06/04/16 0453  AST 18 30  ALT 36 33  ALKPHOS 84 93  BILITOT 0.5 0.7  PROT 4.7* 4.2*  ALBUMIN 2.0* 1.6*   No results for input(s): LIPASE, AMYLASE in the last 72 hours. Cardiac Enzymes No results for input(s): CKTOTAL, CKMB, CKMBINDEX, TROPONINI in the last 72 hours.  BNP: BNP (last 3 results)  Recent Labs  05/31/16 1435  BNP 2,537.9*    ProBNP (last 3 results) No results for input(s): PROBNP in the last 8760  hours.   D-Dimer No results for input(s): DDIMER in the last 72 hours. Hemoglobin A1C No results for input(s): HGBA1C in the last 72 hours. Fasting Lipid Panel  Recent Labs  06/03/16 1609  TRIG 132   Thyroid Function Tests No results for input(s): TSH, T4TOTAL, T3FREE, THYROIDAB in the last 72 hours.  Invalid input(s): FREET3  Other results:     Imaging/Studies:  Dg Chest Port 1 View  Result Date: 06/03/2016 CLINICAL DATA:  Post procedure film for left-sided thoracentesis EXAM: PORTABLE CHEST 1 VIEW COMPARISON:  06/03/2016 FINDINGS: ET tube tip is above the carina. There is a Swan-Ganz catheter with tip in the right ventricular outflow tract. Nasogastric tube tip is identified with side port below GE junction. The intra-aortic balloon pump is identified at the approximate T6 level. Heart size is normal. There has been interval improved aeration to the entire left lung status post thoracentesis. No significant pneumothorax identified. Thickening of the minor fissure within the right midlung is noted. Persistent airspace opacities within the right base. IMPRESSION: Decrease in volume of left pleural effusion with improved aeration to the entire left lung status post thoracentesis. No pneumothorax. Electronically Signed   By: Kerby Moors M.D.   On: 06/03/2016 12:22   Dg Chest Port 1 View  Result Date: 06/03/2016 CLINICAL DATA:  Acute onset of oxygen desaturation. Initial encounter. EXAM: PORTABLE CHEST 1 VIEW COMPARISON:  Chest radiograph performed 06/02/2016 FINDINGS: [name] endotracheal tube is seen ending 3-4 cm above the carina. A right IJ Swan-Ganz catheter is noted ending about the right main pulmonary artery. An enteric tube is noted extending below the diaphragm. An intra-aortic balloon pump tip is noted overlying expected position. Worsening diffuse left-sided airspace opacification reflects a combination of airspace consolidation and increasing left-sided pleural  effusion. A smaller right-sided pleural effusion is seen. This may reflect asymmetric pulmonary edema or pneumonia. No pneumothorax is seen. The cardiomediastinal silhouette is borderline normal in size. No acute osseous abnormalities are seen. IMPRESSION: 1. Endotracheal tube seen ending 3-4 cm above the carina. 2. Worsening diffuse left-sided airspace opacification reflects a combination of airspace consolidation and increasing left-sided pleural effusion. Smaller right-sided pleural effusion also noted. This may reflect asymmetric pulmonary edema or pneumonia. Electronically Signed   By: Garald Balding M.D.   On: 06/03/2016 02:17   Dg Chest Port 1 View  Result Date: 06/02/2016 CLINICAL DATA:  Central line complication. EXAM: PORTABLE CHEST 1 VIEW COMPARISON:  Radiograph of same day.  Bony thorax is unremarkable.  FINDINGS: Endotracheal and nasogastric tubes are unchanged in position. Distal tip of right internal jugular Swan-Ganz catheter is now in expected position of main pulmonary artery. Distal tip of aortic balloon catheter is unchanged projected over aortic arch. No pneumothorax is noted. Stable bilateral perihilar and basilar opacities are noted concerning for edema or inflammation. Probable mild associated pleural effusions are noted as well. IMPRESSION: Right internal jugular Swan-Ganz catheter tip has been slightly withdrawn, now on expected position of main pulmonary artery. Stable bilateral lung opacities are noted as described above. Other support apparatus are stable in position. Electronically Signed   By: Lupita Raider, M.D.   On: 06/02/2016 14:28   Dg Chest Port 1 View  Result Date: 06/02/2016 CLINICAL DATA:  Retraction of Swan-Ganz catheter EXAM: PORTABLE CHEST 1 VIEW COMPARISON:  06/02/2016 FINDINGS: Endotracheal tube, intra-aortic balloon pump, NG tube and right Swan-Ganz catheter remain in place, unchanged. The Swan-Ganz catheter remains in the right lower lobe pulmonary artery.  Bilateral perihilar and lower lobe opacities are again noted, not significantly changed. IMPRESSION: Support devices are in stable position. No change in the location of the Swan-Ganz catheter. Continued bilateral perihilar and lower lobe airspace opacities. Electronically Signed   By: Charlett Nose M.D.   On: 06/02/2016 13:21   Dg Chest Port 1 View  Result Date: 06/02/2016 CLINICAL DATA:  Status post thoracentesis. EXAM: PORTABLE CHEST 1 VIEW COMPARISON:  Portable radiograph earlier today at 10:11 a.m. FINDINGS: RIGHT thoracentesis has been performed. Despite overlying telemetry leads, there is no visible RIGHT apical or RIGHT lateral pneumothorax. Marked decrease RIGHT pleural effusion. Support tubes and lines remain stable. IMPRESSION: Marked decrease RIGHT pleural effusion.  No pneumothorax. Electronically Signed   By: Elsie Stain M.D.   On: 06/02/2016 11:34   Dg Chest Port 1 View  Result Date: 06/02/2016 CLINICAL DATA:  Repositioning of Swan-Ganz catheter EXAM: PORTABLE CHEST 1 VIEW COMPARISON:  06/02/2016 FINDINGS: Swan-Ganz catheter is again noted deep within the right pulmonary artery. Intra-aortic balloon pump is noted in satisfactory position. An endotracheal tube and nasogastric catheter are again seen and stable. Bilateral pleural effusions and underlying atelectasis are again present. No new focal abnormality is seen. IMPRESSION: Swan-Ganz catheter is again noted deep within the right lower lobe pulmonary artery. Tubes and lines as described above. Stable bilateral effusions and atelectatic changes. Electronically Signed   By: Alcide Clever M.D.   On: 06/02/2016 10:18    Latest Echo  Latest Cath   Medications:     Scheduled Medications: . sodium chloride   Intravenous Once  . aspirin  81 mg Oral Daily  . atorvastatin  80 mg Oral q1800  . chlorhexidine gluconate (MEDLINE KIT)  15 mL Mouth Rinse BID  . Chlorhexidine Gluconate Cloth  6 each Topical Daily  . furosemide  80 mg  Intravenous BID  . hydrALAZINE  12.5 mg Oral Q8H  . isosorbide dinitrate  5 mg Oral TID  . mouth rinse  15 mL Mouth Rinse 10 times per day  . mupirocin ointment  1 application Nasal BID  . pantoprazole sodium  40 mg Per Tube Q24H  . piperacillin-tazobactam (ZOSYN)  IV  3.375 g Intravenous Q8H  . sodium chloride flush  10-40 mL Intracatheter Q12H  . vancomycin  750 mg Intravenous Q24H    Infusions: . sodium chloride 10 mL/hr at 06/03/16 2000  . amiodarone 30 mg/hr (06/04/16 0000)  . feeding supplement (VITAL AF 1.2 CAL) 1,000 mL (06/04/16 0100)  . fentaNYL infusion INTRAVENOUS  200 mcg/hr (06/04/16 0630)  . heparin 850 Units/hr (06/03/16 2000)  . insulin (NOVOLIN-R) infusion 5.6 Units/hr (06/04/16 0700)  . propofol (DIPRIVAN) infusion 10 mcg/kg/min (06/03/16 2300)    PRN Medications: sodium chloride, Place/Maintain arterial line **AND** sodium chloride, acetaminophen (TYLENOL) oral liquid 160 mg/5 mL, fentaNYL, Influenza vac split quadrivalent PF, ondansetron (ZOFRAN) IV, sodium chloride flush   Assessment/Plan   YAMIN SWINGLER is a 60 y.o. male with hx of poorly controlled diabetes, HTN, CKD IV, and HLD.  Admitted 05/31/16 with orthopnea, DOE, and CP.  Intubated, sedated, placed on IABP.  HF team consulted for optimization. Cardiac surgery consulted as well with 3 vessel disease.    1. Acute on chronic systolic CHF: EF 45-73% with wall motion abnormalities, ischemic cardiomyopathy.  IABP in place 1:1.  I>>O yesterday but CVP/PCWP lower.  Cardiac index good at 2.7 but co-ox lower.   - Keep IABP in place at 1:1. - Repeat co-ox, consider milrinone use if remains low (but good cardiac index on swan).  - Lasix 80 mg IV bid today to try to keep close to even with I/Os.  - Can continue low dose hydralazine 12.5 tid + isordil 5 tid, BP stable.    2. CAD: Admitted with NSTEMI, severe 3VD.  - ASA, atorvastatin 80.  - CVTS following for CABG Friday hopefully.  3. AKI on CKD stage IV.  Creatinine  up to 3.57.  Baseline seems to be aroud 2.5.  Follow closely, using Lasix IV for now to keep as even as possible with I/Os (good filling pressures today but I>>O yesterday).  Cardiac output is optimized.   4. Acute respiratory failure: right thoracentesis and left thoracentesis done.  Lungs more clear. 5. Anemia: Right groin IABP site stable. Hemoglobin lower today at 7.2, will give 1 unit PRBCs.  6. ID: Afebrile, On vanco/Zosyn, ?PNA.  Few Serratia in sputum cultures. .     35 minutes critical care time.   Length of Stay: [place]  06/04/2016, 7:40 AM  Advanced Heart Failure Team Pager [phone] (M-F; 7a - 4p)  Please contact [facility] for night-coverage after hours (4p -7a ) and weekends on [email or web address]

## 2016-06-05 ENCOUNTER — Inpatient Hospital Stay (HOSPITAL_COMMUNITY): Payer: BLUE CROSS/BLUE SHIELD

## 2016-06-05 ENCOUNTER — Inpatient Hospital Stay (HOSPITAL_COMMUNITY): Payer: BLUE CROSS/BLUE SHIELD | Admitting: Anesthesiology

## 2016-06-05 ENCOUNTER — Encounter (HOSPITAL_COMMUNITY)
Admission: EM | Disposition: A | Discharge status: Home Health | Payer: Self-pay | Source: Home / Self Care | Attending: Internal Medicine

## 2016-06-05 DIAGNOSIS — N179 Acute kidney failure, unspecified: Secondary | ICD-10-CM

## 2016-06-05 HISTORY — PX: CORONARY ARTERY BYPASS GRAFT: SHX141

## 2016-06-05 HISTORY — PX: INTRAOPERATIVE TRANSESOPHAGEAL ECHOCARDIOGRAM: SHX5062

## 2016-06-05 HISTORY — PX: INSERTION OF DIALYSIS CATHETER: SHX1324

## 2016-06-05 LAB — POCT I-STAT 3, ART BLOOD GAS (G3+)
ACID-BASE DEFICIT: 1 mmol/L (ref 0.0–2.0)
ACID-BASE DEFICIT: 3 mmol/L — AB (ref 0.0–2.0)
ACID-BASE DEFICIT: 5 mmol/L — AB (ref 0.0–2.0)
Acid-base deficit: 6 mmol/L — ABNORMAL HIGH (ref 0.0–2.0)
Acid-base deficit: 5 mmol/L — ABNORMAL HIGH (ref 0.0–2.0)
BICARBONATE: 23.6 mmol/L (ref 20.0–28.0)
Bicarbonate: 20.6 mmol/L (ref 20.0–28.0)
Bicarbonate: 21.6 mmol/L (ref 20.0–28.0)
Bicarbonate: 22 mmol/L (ref 20.0–28.0)
Bicarbonate: 20.2 mmol/L (ref 20.0–28.0)
O2 SAT: 100 %
O2 Saturation: 100 %
O2 Saturation: 100 %
O2 Saturation: 100 %
O2 Saturation: 100 %
PCO2 ART: 45.6 mmHg (ref 32.0–48.0)
PCO2 ART: 46.2 mmHg (ref 32.0–48.0)
PH ART: 7.262 — AB (ref 7.350–7.450)
PH ART: 7.278 — AB (ref 7.350–7.450)
PO2 ART: 299 mmHg — AB (ref 83.0–108.0)
PO2 ART: 301 mmHg — AB (ref 83.0–108.0)
PO2 ART: 256 mmHg — AB (ref 83.0–108.0)
TCO2: 22 mmol/L (ref 0–100)
TCO2: 23 mmol/L (ref 0–100)
TCO2: 25 mmol/L (ref 0–100)
TCO2: 23 mmol/L (ref 0–100)
TCO2: 21 mmol/L (ref 0–100)
pCO2 arterial: 40.6 mmHg (ref 32.0–48.0)
pCO2 arterial: 38.9 mmHg (ref 32.0–48.0)
pCO2 arterial: 39.3 mmHg (ref 32.0–48.0)
pH, Arterial: 7.373 (ref 7.350–7.450)
pH, Arterial: 7.359 (ref 7.350–7.450)
pH, Arterial: 7.319 — ABNORMAL LOW (ref 7.350–7.450)
pO2, Arterial: 290 mmHg — ABNORMAL HIGH (ref 83.0–108.0)
pO2, Arterial: 211 mmHg — ABNORMAL HIGH (ref 83.0–108.0)

## 2016-06-05 LAB — COMPREHENSIVE METABOLIC PANEL
ALT: 35 U/L (ref 17–63)
AST: 29 U/L (ref 15–41)
Albumin: 1.7 g/dL — ABNORMAL LOW (ref 3.5–5.0)
Alkaline Phosphatase: 112 U/L (ref 38–126)
Anion gap: 14 (ref 5–15)
BUN: 94 mg/dL — ABNORMAL HIGH (ref 6–20)
CO2: 21 mmol/L — ABNORMAL LOW (ref 22–32)
Calcium: 7.7 mg/dL — ABNORMAL LOW (ref 8.9–10.3)
Chloride: 104 mmol/L (ref 101–111)
Creatinine, Ser: 4.14 mg/dL — ABNORMAL HIGH (ref 0.61–1.24)
GFR calc Af Amer: 17 mL/min — ABNORMAL LOW (ref >60)
GFR calc non Af Amer: 15 mL/min — ABNORMAL LOW (ref >60)
Glucose, Bld: 186 mg/dL — ABNORMAL HIGH (ref 65–99)
Potassium: 3.8 mmol/L (ref 3.5–5.1)
Sodium: 139 mmol/L (ref 135–145)
Total Bilirubin: 0.5 mg/dL (ref 0.3–1.2)
Total Protein: 5 g/dL — ABNORMAL LOW (ref 6.5–8.1)

## 2016-06-05 LAB — GLUCOSE, CAPILLARY
GLUCOSE-CAPILLARY: 198 mg/dL — AB (ref 65–99)
GLUCOSE-CAPILLARY: 180 mg/dL — AB (ref 65–99)
GLUCOSE-CAPILLARY: 141 mg/dL — AB (ref 65–99)
GLUCOSE-CAPILLARY: 124 mg/dL — AB (ref 65–99)
GLUCOSE-CAPILLARY: 148 mg/dL — AB (ref 65–99)
GLUCOSE-CAPILLARY: 182 mg/dL — AB (ref 65–99)
GLUCOSE-CAPILLARY: 182 mg/dL — AB (ref 65–99)
GLUCOSE-CAPILLARY: 103 mg/dL — AB (ref 65–99)
GLUCOSE-CAPILLARY: 109 mg/dL — AB (ref 65–99)
Glucose-Capillary: 196 mg/dL — ABNORMAL HIGH (ref 65–99)
Glucose-Capillary: 176 mg/dL — ABNORMAL HIGH (ref 65–99)
Glucose-Capillary: 154 mg/dL — ABNORMAL HIGH (ref 65–99)
Glucose-Capillary: 95 mg/dL (ref 65–99)
Glucose-Capillary: 144 mg/dL — ABNORMAL HIGH (ref 65–99)
Glucose-Capillary: 153 mg/dL — ABNORMAL HIGH (ref 65–99)
Glucose-Capillary: 129 mg/dL — ABNORMAL HIGH (ref 65–99)
Glucose-Capillary: 104 mg/dL — ABNORMAL HIGH (ref 65–99)

## 2016-06-05 LAB — POCT I-STAT, CHEM 8
BUN: 100 mg/dL — ABNORMAL HIGH (ref 6–20)
BUN: 93 mg/dL — ABNORMAL HIGH (ref 6–20)
BUN: 94 mg/dL — ABNORMAL HIGH (ref 6–20)
BUN: 88 mg/dL — ABNORMAL HIGH (ref 6–20)
BUN: 87 mg/dL — AB (ref 6–20)
BUN: 85 mg/dL — AB (ref 6–20)
CALCIUM ION: 1 mmol/L — AB (ref 1.15–1.40)
CALCIUM ION: 0.99 mmol/L — AB (ref 1.15–1.40)
CHLORIDE: 103 mmol/L (ref 101–111)
CHLORIDE: 105 mmol/L (ref 101–111)
CHLORIDE: 101 mmol/L (ref 101–111)
CREATININE: 3.7 mg/dL — AB (ref 0.61–1.24)
CREATININE: 4.1 mg/dL — AB (ref 0.61–1.24)
CREATININE: 3.8 mg/dL — AB (ref 0.61–1.24)
Calcium, Ion: 1.05 mmol/L — ABNORMAL LOW (ref 1.15–1.40)
Calcium, Ion: 0.96 mmol/L — ABNORMAL LOW (ref 1.15–1.40)
Calcium, Ion: 0.9 mmol/L — ABNORMAL LOW (ref 1.15–1.40)
Calcium, Ion: 0.94 mmol/L — ABNORMAL LOW (ref 1.15–1.40)
Chloride: 103 mmol/L (ref 101–111)
Chloride: 104 mmol/L (ref 101–111)
Chloride: 103 mmol/L (ref 101–111)
Creatinine, Ser: 4.2 mg/dL — ABNORMAL HIGH (ref 0.61–1.24)
Creatinine, Ser: 3.9 mg/dL — ABNORMAL HIGH (ref 0.61–1.24)
Creatinine, Ser: 4.1 mg/dL — ABNORMAL HIGH (ref 0.61–1.24)
GLUCOSE: 114 mg/dL — AB (ref 65–99)
GLUCOSE: 147 mg/dL — AB (ref 65–99)
GLUCOSE: 214 mg/dL — AB (ref 65–99)
GLUCOSE: 200 mg/dL — AB (ref 65–99)
GLUCOSE: 212 mg/dL — AB (ref 65–99)
Glucose, Bld: 160 mg/dL — ABNORMAL HIGH (ref 65–99)
HCT: 23 % — ABNORMAL LOW (ref 39.0–52.0)
HCT: 29 % — ABNORMAL LOW (ref 39.0–52.0)
HCT: 26 % — ABNORMAL LOW (ref 39.0–52.0)
HEMATOCRIT: 24 % — AB (ref 39.0–52.0)
HEMATOCRIT: 21 % — AB (ref 39.0–52.0)
HEMATOCRIT: 25 % — AB (ref 39.0–52.0)
HEMOGLOBIN: 7.8 g/dL — AB (ref 13.0–17.0)
HEMOGLOBIN: 8.2 g/dL — AB (ref 13.0–17.0)
HEMOGLOBIN: 9.9 g/dL — AB (ref 13.0–17.0)
HEMOGLOBIN: 7.1 g/dL — AB (ref 13.0–17.0)
HEMOGLOBIN: 8.8 g/dL — AB (ref 13.0–17.0)
Hemoglobin: 8.5 g/dL — ABNORMAL LOW (ref 13.0–17.0)
POTASSIUM: 3.5 mmol/L (ref 3.5–5.1)
POTASSIUM: 3.9 mmol/L (ref 3.5–5.1)
POTASSIUM: 4.4 mmol/L (ref 3.5–5.1)
POTASSIUM: 5.1 mmol/L (ref 3.5–5.1)
Potassium: 3.7 mmol/L (ref 3.5–5.1)
Potassium: 4.3 mmol/L (ref 3.5–5.1)
SODIUM: 140 mmol/L (ref 135–145)
SODIUM: 140 mmol/L (ref 135–145)
Sodium: 141 mmol/L (ref 135–145)
Sodium: 138 mmol/L (ref 135–145)
Sodium: 138 mmol/L (ref 135–145)
Sodium: 139 mmol/L (ref 135–145)
TCO2: 24 mmol/L (ref 0–100)
TCO2: 23 mmol/L (ref 0–100)
TCO2: 23 mmol/L (ref 0–100)
TCO2: 25 mmol/L (ref 0–100)
TCO2: 23 mmol/L (ref 0–100)
TCO2: 22 mmol/L (ref 0–100)

## 2016-06-05 LAB — BLOOD GAS, ARTERIAL
Acid-base deficit: 3.5 mmol/L — ABNORMAL HIGH (ref 0.0–2.0)
Bicarbonate: 22 mmol/L (ref 20.0–28.0)
Drawn by: 44135
FIO2: 40
MECHVT: 520 mL
O2 Saturation: 94.5 %
PEEP: 8 cmH2O
Patient temperature: 98.6
RATE: 18 resp/min
pCO2 arterial: 46.4 mmHg (ref 32.0–48.0)
pH, Arterial: 7.297 — ABNORMAL LOW (ref 7.350–7.450)
pO2, Arterial: 82.5 mmHg — ABNORMAL LOW (ref 83.0–108.0)

## 2016-06-05 LAB — CARBOXYHEMOGLOBIN
CARBOXYHEMOGLOBIN: 1.1 % (ref 0.5–1.5)
CARBOXYHEMOGLOBIN: 1.5 % (ref 0.5–1.5)
Carboxyhemoglobin: 1.1 % (ref 0.5–1.5)
Methemoglobin: 1.1 % (ref 0.0–1.5)
Methemoglobin: 0.9 % (ref 0.0–1.5)
Methemoglobin: 1.1 % (ref 0.0–1.5)
O2 SAT: 72 %
O2 Saturation: 52.9 %
O2 Saturation: 84.3 %
TOTAL HEMOGLOBIN: 9.9 g/dL — AB (ref 12.0–16.0)
Total hemoglobin: 13.6 g/dL (ref 12.0–16.0)
Total hemoglobin: 10.6 g/dL — ABNORMAL LOW (ref 12.0–16.0)

## 2016-06-05 LAB — CBC
HCT: 26.3 % — ABNORMAL LOW (ref 39.0–52.0)
Hemoglobin: 8.6 g/dL — ABNORMAL LOW (ref 13.0–17.0)
MCH: 29.6 pg (ref 26.0–34.0)
MCHC: 32.7 g/dL (ref 30.0–36.0)
MCV: 90.4 fL (ref 78.0–100.0)
PLATELETS: 134 10*3/uL — AB (ref 150–400)
RBC: 2.91 MIL/uL — ABNORMAL LOW (ref 4.22–5.81)
RDW: 14.2 % (ref 11.5–15.5)
WBC: 12.8 10*3/uL — AB (ref 4.0–10.5)

## 2016-06-05 LAB — HEMOGLOBIN AND HEMATOCRIT, BLOOD
HCT: 24.9 % — ABNORMAL LOW (ref 39.0–52.0)
Hemoglobin: 8.2 g/dL — ABNORMAL LOW (ref 13.0–17.0)

## 2016-06-05 LAB — HEPARIN LEVEL (UNFRACTIONATED): HEPARIN UNFRACTIONATED: 0.3 [IU]/mL (ref 0.30–0.70)

## 2016-06-05 LAB — APTT: aPTT: 120 seconds — ABNORMAL HIGH (ref 24–36)

## 2016-06-05 LAB — HEMOGLOBIN A1C
Hgb A1c MFr Bld: 10.4 % — ABNORMAL HIGH (ref 4.8–5.6)
Mean Plasma Glucose: 252 mg/dL

## 2016-06-05 LAB — FIBRINOGEN: Fibrinogen: 402 mg/dL (ref 210–475)

## 2016-06-05 LAB — PREPARE RBC (CROSSMATCH)

## 2016-06-05 SURGERY — CORONARY ARTERY BYPASS GRAFTING (CABG)
Anesthesia: General | Site: Chest

## 2016-06-05 MED ORDER — SODIUM CHLORIDE 0.9 % IJ SOLN
OROMUCOSAL | Status: DC | PRN
  Administered 2016-06-05: 19:00:00 via TOPICAL

## 2016-06-05 MED ORDER — PIPERACILLIN-TAZOBACTAM 3.375 G IVPB
3.3750 g | Freq: Two times a day (BID) | INTRAVENOUS | Status: DC
  Filled 2016-06-05: qty 50

## 2016-06-05 MED ORDER — DOBUTAMINE IN D5W 4-5 MG/ML-% IV SOLN
2.5000 ug/kg/min | INTRAVENOUS | Status: DC
  Administered 2016-06-05: 2.5 ug/kg/min via INTRAVENOUS

## 2016-06-05 MED ORDER — MIDAZOLAM HCL 5 MG/5ML IJ SOLN
INTRAMUSCULAR | Status: DC | PRN
  Administered 2016-06-05 (×2): 5 mg via INTRAVENOUS

## 2016-06-05 MED ORDER — FENTANYL CITRATE (PF) 250 MCG/5ML IJ SOLN
INTRAMUSCULAR | Status: AC
  Filled 2016-06-05: qty 30

## 2016-06-05 MED ORDER — PHENYLEPHRINE HCL 10 MG/ML IJ SOLN
INTRAMUSCULAR | Status: DC | PRN
  Administered 2016-06-05: 80 ug via INTRAVENOUS
  Administered 2016-06-05: 120 ug via INTRAVENOUS

## 2016-06-05 MED ORDER — EPINEPHRINE HCL 1 MG/ML IJ SOLN
INTRAVENOUS | Status: DC | PRN
  Administered 2016-06-05: 3 ug/min via INTRAVENOUS

## 2016-06-05 MED ORDER — THROMBIN 5000 UNITS EX SOLR
CUTANEOUS | Status: AC
  Filled 2016-06-05: qty 5000

## 2016-06-05 MED ORDER — ARTIFICIAL TEARS OP OINT
TOPICAL_OINTMENT | OPHTHALMIC | Status: DC | PRN
  Administered 2016-06-05: 1 via OPHTHALMIC

## 2016-06-05 MED ORDER — THROMBIN 5000 UNITS EX SOLR
CUTANEOUS | Status: DC | PRN
  Administered 2016-06-05: 2000 [IU] via TOPICAL

## 2016-06-05 MED ORDER — NOREPINEPHRINE BITARTRATE 1 MG/ML IV SOLN
0.0000 ug/min | INTRAVENOUS | Status: DC
  Filled 2016-06-05: qty 4

## 2016-06-05 MED ORDER — HYDRALAZINE HCL 20 MG/ML IJ SOLN
10.0000 mg | INTRAMUSCULAR | Status: DC | PRN
  Administered 2016-06-05: 10 mg via INTRAVENOUS

## 2016-06-05 MED ORDER — EPHEDRINE SULFATE 50 MG/ML IJ SOLN
INTRAMUSCULAR | Status: DC | PRN
  Administered 2016-06-05 (×4): 10 mg via INTRAVENOUS

## 2016-06-05 MED ORDER — SODIUM CHLORIDE 0.9 % IV SOLN
INTRAVENOUS | Status: DC | PRN
  Administered 2016-06-05: 18:00:00 via INTRAVENOUS

## 2016-06-05 MED ORDER — MILRINONE LACTATE IN DEXTROSE 20-5 MG/100ML-% IV SOLN
0.2500 ug/kg/min | Freq: Once | INTRAVENOUS | Status: AC
  Administered 2016-06-05: 0.5 ug/kg/min via INTRAVENOUS
  Filled 2016-06-05: qty 100

## 2016-06-05 MED ORDER — HYDRALAZINE HCL 20 MG/ML IJ SOLN
INTRAMUSCULAR | Status: AC
  Filled 2016-06-05: qty 1

## 2016-06-05 MED ORDER — PHENYLEPHRINE HCL 10 MG/ML IJ SOLN
INTRAMUSCULAR | Status: DC | PRN
  Administered 2016-06-05: 30 ug/min via INTRAVENOUS

## 2016-06-05 MED ORDER — HEPARIN SODIUM (PORCINE) 1000 UNIT/ML IJ SOLN
INTRAMUSCULAR | Status: AC
  Filled 2016-06-05: qty 1

## 2016-06-05 MED ORDER — SODIUM CHLORIDE 0.9 % IV SOLN
INTRAVENOUS | Status: DC | PRN
  Administered 2016-06-05: 23:00:00

## 2016-06-05 MED ORDER — HEPARIN SODIUM (PORCINE) 1000 UNIT/ML IJ SOLN
INTRAMUSCULAR | Status: DC | PRN
  Administered 2016-06-05: 50 [IU] via INTRAVENOUS
  Administered 2016-06-05: 2000 [IU] via INTRAVENOUS
  Administered 2016-06-05: 23000 [IU] via INTRAVENOUS

## 2016-06-05 MED ORDER — DOBUTAMINE IN D5W 4-5 MG/ML-% IV SOLN
2.5000 ug/kg/min | INTRAVENOUS | Status: DC
  Administered 2016-06-05: 2.5 ug/kg/min via INTRAVENOUS
  Filled 2016-06-05: qty 250

## 2016-06-05 MED ORDER — SODIUM CHLORIDE 0.9 % IV SOLN: Freq: Once | INTRAVENOUS | Status: DC

## 2016-06-05 MED ORDER — MILRINONE LACTATE IN DEXTROSE 20-5 MG/100ML-% IV SOLN: 0.2500 ug/kg/min | INTRAVENOUS | Status: DC

## 2016-06-05 MED ORDER — PHENYLEPHRINE 40 MCG/ML (10ML) SYRINGE FOR IV PUSH (FOR BLOOD PRESSURE SUPPORT)
PREFILLED_SYRINGE | INTRAVENOUS | Status: AC
  Filled 2016-06-05: qty 20

## 2016-06-05 MED ORDER — LIDOCAINE HCL (PF) 1 % IJ SOLN
INTRAMUSCULAR | Status: AC
  Filled 2016-06-05: qty 30

## 2016-06-05 MED ORDER — SODIUM BICARBONATE 8.4 % IV SOLN
50.0000 meq | Freq: Once | INTRAVENOUS | Status: AC
  Administered 2016-06-05: 50 meq via INTRAVENOUS
  Filled 2016-06-05: qty 50

## 2016-06-05 MED ORDER — HEMOSTATIC AGENTS (NO CHARGE) OPTIME
TOPICAL | Status: DC | PRN
  Administered 2016-06-05: 1 via TOPICAL

## 2016-06-05 MED ORDER — FENTANYL CITRATE (PF) 250 MCG/5ML IJ SOLN
INTRAMUSCULAR | Status: DC | PRN
  Administered 2016-06-05 (×2): 50 ug via INTRAVENOUS
  Administered 2016-06-05: 300 ug via INTRAVENOUS
  Administered 2016-06-05: 250 ug via INTRAVENOUS
  Administered 2016-06-05: 150 ug via INTRAVENOUS
  Administered 2016-06-05 (×4): 50 ug via INTRAVENOUS

## 2016-06-05 MED ORDER — ROCURONIUM BROMIDE 10 MG/ML (PF) SYRINGE
PREFILLED_SYRINGE | INTRAVENOUS | Status: DC | PRN
  Administered 2016-06-05: 50 mg via INTRAVENOUS
  Administered 2016-06-05: 100 mg via INTRAVENOUS

## 2016-06-05 MED ORDER — PROTAMINE SULFATE 10 MG/ML IV SOLN
INTRAVENOUS | Status: DC | PRN
  Administered 2016-06-05: 25 mg via INTRAVENOUS
  Administered 2016-06-05: 225 mg via INTRAVENOUS

## 2016-06-05 MED ORDER — DEXTROSE 5 % IV SOLN
INTRAVENOUS | Status: DC | PRN
  Administered 2016-06-05: 5 ug/min via INTRAVENOUS

## 2016-06-05 MED ORDER — 0.9 % SODIUM CHLORIDE (POUR BTL) OPTIME
TOPICAL | Status: DC | PRN
  Administered 2016-06-05: 6000 mL

## 2016-06-05 MED ORDER — GELATIN ABSORBABLE MT POWD
OROMUCOSAL | Status: DC | PRN
  Administered 2016-06-05: 19:00:00 via TOPICAL

## 2016-06-05 MED ORDER — MIDAZOLAM HCL 10 MG/2ML IJ SOLN
INTRAMUSCULAR | Status: AC
  Filled 2016-06-05: qty 2

## 2016-06-05 MED FILL — Potassium Chloride Inj 2 mEq/ML: INTRAVENOUS | Qty: 40 | Status: AC

## 2016-06-05 MED FILL — Heparin Sodium (Porcine) Inj 1000 Unit/ML: INTRAMUSCULAR | Qty: 30 | Status: AC

## 2016-06-05 MED FILL — Magnesium Sulfate Inj 50%: INTRAMUSCULAR | Qty: 10 | Status: AC

## 2016-06-05 SURGICAL SUPPLY — 131 items
ADAPTER CARDIO PERF ANTE/RETRO (ADAPTER) ×4 IMPLANT
BAG DECANTER FOR FLEXI CONT (MISCELLANEOUS) ×8 IMPLANT
BANDAGE ACE 4X5 VEL STRL LF (GAUZE/BANDAGES/DRESSINGS) ×4 IMPLANT
BANDAGE ACE 6X5 VEL STRL LF (GAUZE/BANDAGES/DRESSINGS) ×4 IMPLANT
BASKET HEART  (ORDER IN 25'S) (MISCELLANEOUS) ×1
BASKET HEART (ORDER IN 25'S) (MISCELLANEOUS) ×1
BASKET HEART (ORDER IN 25S) (MISCELLANEOUS) ×2 IMPLANT
BLADE STERNUM SYSTEM 6 (BLADE) ×4 IMPLANT
BLADE SURG 11 STRL SS (BLADE) ×4 IMPLANT
BLADE SURG 12 STRL SS (BLADE) ×4 IMPLANT
BLADE SURG ROTATE 9660 (MISCELLANEOUS) IMPLANT
BNDG GAUZE ELAST 4 BULKY (GAUZE/BANDAGES/DRESSINGS) ×4 IMPLANT
CANISTER SUCTION 2500CC (MISCELLANEOUS) ×4 IMPLANT
CANNULA GUNDRY RCSP 15FR (MISCELLANEOUS) ×4 IMPLANT
CATH CANNON HEMO 15F 50CM (CATHETERS) IMPLANT
CATH CANNON HEMO 15FR 19 (HEMODIALYSIS SUPPLIES) IMPLANT
CATH CANNON HEMO 15FR 23CM (HEMODIALYSIS SUPPLIES) IMPLANT
CATH CANNON HEMO 15FR 31CM (HEMODIALYSIS SUPPLIES) IMPLANT
CATH CANNON HEMO 15FR 32CM (HEMODIALYSIS SUPPLIES) IMPLANT
CATH CPB KIT VANTRIGT (MISCELLANEOUS) ×4 IMPLANT
CATH ROBINSON RED A/P 18FR (CATHETERS) ×12 IMPLANT
CATH THORACIC 36FR RT ANG (CATHETERS) ×8 IMPLANT
CHLORAPREP W/TINT 26ML (MISCELLANEOUS) IMPLANT
CLIP TI WIDE RED SMALL 24 (CLIP) ×8 IMPLANT
COVER PROBE W GEL 5X96 (DRAPES) ×4 IMPLANT
COVER SURGICAL LIGHT HANDLE (MISCELLANEOUS) ×4 IMPLANT
CRADLE DONUT ADULT HEAD (MISCELLANEOUS) ×4 IMPLANT
DECANTER SPIKE VIAL GLASS SM (MISCELLANEOUS) IMPLANT
DERMABOND ADVANCED (GAUZE/BANDAGES/DRESSINGS) ×2
DERMABOND ADVANCED .7 DNX12 (GAUZE/BANDAGES/DRESSINGS) ×2 IMPLANT
DRAIN CHANNEL 32F RND 10.7 FF (WOUND CARE) ×4 IMPLANT
DRAPE C-ARM 42X72 X-RAY (DRAPES) IMPLANT
DRAPE CARDIOVASCULAR INCISE (DRAPES) ×2
DRAPE CHEST BREAST 15X10 FENES (DRAPES) IMPLANT
DRAPE SLUSH/WARMER DISC (DRAPES) ×4 IMPLANT
DRAPE SRG 135X102X78XABS (DRAPES) ×2 IMPLANT
DRSG AQUACEL AG ADV 3.5X14 (GAUZE/BANDAGES/DRESSINGS) ×4 IMPLANT
ELECT BLADE 4.0 EZ CLEAN MEGAD (MISCELLANEOUS) ×4
ELECT BLADE 6.5 EXT (BLADE) ×4 IMPLANT
ELECT CAUTERY BLADE 6.4 (BLADE) ×4 IMPLANT
ELECT REM PT RETURN 9FT ADLT (ELECTROSURGICAL) ×8
ELECTRODE BLDE 4.0 EZ CLN MEGD (MISCELLANEOUS) ×2 IMPLANT
ELECTRODE REM PT RTRN 9FT ADLT (ELECTROSURGICAL) ×4 IMPLANT
FELT TEFLON 1X6 (MISCELLANEOUS) ×4 IMPLANT
GAUZE SPONGE 2X2 8PLY STRL LF (GAUZE/BANDAGES/DRESSINGS) IMPLANT
GAUZE SPONGE 4X4 12PLY STRL (GAUZE/BANDAGES/DRESSINGS) ×8 IMPLANT
GAUZE SPONGE 4X4 16PLY XRAY LF (GAUZE/BANDAGES/DRESSINGS) IMPLANT
GLOVE BIO SURGEON STRL SZ7.5 (GLOVE) ×12 IMPLANT
GLOVE BIOGEL M 6.5 STRL (GLOVE) ×16 IMPLANT
GLOVE BIOGEL M STER SZ 6 (GLOVE) ×8 IMPLANT
GLOVE BIOGEL PI IND STRL 6 (GLOVE) ×2 IMPLANT
GLOVE BIOGEL PI IND STRL 6.5 (GLOVE) ×2 IMPLANT
GLOVE BIOGEL PI IND STRL 7.5 (GLOVE) ×4 IMPLANT
GLOVE BIOGEL PI INDICATOR 6 (GLOVE) ×2
GLOVE BIOGEL PI INDICATOR 6.5 (GLOVE) ×2
GLOVE BIOGEL PI INDICATOR 7.5 (GLOVE) ×4
GOWN STRL REUS W/ TWL LRG LVL3 (GOWN DISPOSABLE) ×14 IMPLANT
GOWN STRL REUS W/ TWL XL LVL3 (GOWN DISPOSABLE) IMPLANT
GOWN STRL REUS W/TWL LRG LVL3 (GOWN DISPOSABLE) ×14
GOWN STRL REUS W/TWL XL LVL3 (GOWN DISPOSABLE)
HEMOSTAT POWDER SURGIFOAM 1G (HEMOSTASIS) ×12 IMPLANT
HEMOSTAT SURGICEL 2X14 (HEMOSTASIS) ×4 IMPLANT
INSERT FOGARTY XLG (MISCELLANEOUS) IMPLANT
KIT BASIN OR (CUSTOM PROCEDURE TRAY) ×4 IMPLANT
KIT ROOM TURNOVER OR (KITS) ×4 IMPLANT
KIT SUCTION CATH 14FR (SUCTIONS) ×4 IMPLANT
KIT VASOVIEW HEMOPRO VH 3000 (KITS) ×4 IMPLANT
LEAD PACING MYOCARDI (MISCELLANEOUS) ×4 IMPLANT
MARKER GRAFT CORONARY BYPASS (MISCELLANEOUS) ×12 IMPLANT
NEEDLE 18GX1X1/2 (RX/OR ONLY) (NEEDLE) IMPLANT
NEEDLE HYPO 25GX1X1/2 BEV (NEEDLE) IMPLANT
NS IRRIG 1000ML POUR BTL (IV SOLUTION) ×24 IMPLANT
PACK OPEN HEART (CUSTOM PROCEDURE TRAY) ×4 IMPLANT
PACK SURGICAL SETUP 50X90 (CUSTOM PROCEDURE TRAY) IMPLANT
PAD ARMBOARD 7.5X6 YLW CONV (MISCELLANEOUS) ×16 IMPLANT
PAD ELECT DEFIB RADIOL ZOLL (MISCELLANEOUS) ×4 IMPLANT
PENCIL BUTTON HOLSTER BLD 10FT (ELECTRODE) ×4 IMPLANT
PUNCH AORTIC ROTATE  4.5MM 8IN (MISCELLANEOUS) ×4 IMPLANT
PUNCH AORTIC ROTATE 4.0MM (MISCELLANEOUS) IMPLANT
PUNCH AORTIC ROTATE 4.5MM 8IN (MISCELLANEOUS) IMPLANT
PUNCH AORTIC ROTATE 5MM 8IN (MISCELLANEOUS) IMPLANT
SET CARDIOPLEGIA MPS 5001102 (MISCELLANEOUS) ×4 IMPLANT
SOLUTION ANTI FOG 6CC (MISCELLANEOUS) ×4 IMPLANT
SPONGE GAUZE 2X2 STER 10/PKG (GAUZE/BANDAGES/DRESSINGS)
SPONGE LAP 18X18 X RAY DECT (DISPOSABLE) ×8 IMPLANT
SPONGE LAP 4X18 X RAY DECT (DISPOSABLE) ×8 IMPLANT
SURGIFLO W/THROMBIN 8M KIT (HEMOSTASIS) IMPLANT
SUT BONE WAX W31G (SUTURE) ×4 IMPLANT
SUT ETHILON 3 0 FSL (SUTURE) ×4 IMPLANT
SUT ETHILON 3 0 PS 1 (SUTURE) IMPLANT
SUT MNCRL AB 4-0 PS2 18 (SUTURE) ×4 IMPLANT
SUT PROLENE 3 0 SH DA (SUTURE) IMPLANT
SUT PROLENE 3 0 SH1 36 (SUTURE) IMPLANT
SUT PROLENE 4 0 RB 1 (SUTURE) ×2
SUT PROLENE 4 0 SH DA (SUTURE) ×4 IMPLANT
SUT PROLENE 4-0 RB1 .5 CRCL 36 (SUTURE) ×2 IMPLANT
SUT PROLENE 5 0 C 1 36 (SUTURE) IMPLANT
SUT PROLENE 6 0 C 1 30 (SUTURE) ×12 IMPLANT
SUT PROLENE 6 0 CC (SUTURE) ×20 IMPLANT
SUT PROLENE 8 0 BV175 6 (SUTURE) ×8 IMPLANT
SUT PROLENE BLUE 7 0 (SUTURE) ×4 IMPLANT
SUT PROLENE POLY MONO (SUTURE) ×16 IMPLANT
SUT SILK  1 MH (SUTURE)
SUT SILK 1 MH (SUTURE) IMPLANT
SUT SILK 2 0 SH CR/8 (SUTURE) ×4 IMPLANT
SUT SILK 3 0 SH CR/8 (SUTURE) IMPLANT
SUT STEEL 6MS V (SUTURE) ×4 IMPLANT
SUT STEEL SZ 6 DBL 3X14 BALL (SUTURE) ×8 IMPLANT
SUT VIC AB 1 CTX 36 (SUTURE) ×4
SUT VIC AB 1 CTX36XBRD ANBCTR (SUTURE) ×4 IMPLANT
SUT VIC AB 2-0 CT1 27 (SUTURE) ×2
SUT VIC AB 2-0 CT1 TAPERPNT 27 (SUTURE) ×2 IMPLANT
SUT VIC AB 2-0 CTX 27 (SUTURE) IMPLANT
SUT VIC AB 3-0 X1 27 (SUTURE) IMPLANT
SUT VICRYL 4-0 PS2 18IN ABS (SUTURE) IMPLANT
SUTURE E-PAK OPEN HEART (SUTURE) ×4 IMPLANT
SYR 20CC LL (SYRINGE) IMPLANT
SYR 30ML LL (SYRINGE) IMPLANT
SYR 5ML LL (SYRINGE) IMPLANT
SYR CONTROL 10ML LL (SYRINGE) IMPLANT
SYRINGE 10CC LL (SYRINGE) IMPLANT
SYSTEM SAHARA CHEST DRAIN ATS (WOUND CARE) ×4 IMPLANT
TAPE CLOTH SURG 4X10 WHT LF (GAUZE/BANDAGES/DRESSINGS) ×4 IMPLANT
TAPE PAPER 2X10 WHT MICROPORE (GAUZE/BANDAGES/DRESSINGS) ×4 IMPLANT
TOWEL OR 17X24 6PK STRL BLUE (TOWEL DISPOSABLE) ×8 IMPLANT
TOWEL OR 17X26 10 PK STRL BLUE (TOWEL DISPOSABLE) ×8 IMPLANT
TRAY FOLEY IC TEMP SENS 16FR (CATHETERS) ×4 IMPLANT
TUBING INSUFFLATION (TUBING) ×4 IMPLANT
UNDERPAD 30X30 (UNDERPADS AND DIAPERS) ×4 IMPLANT
WATER STERILE IRR 1000ML POUR (IV SOLUTION) ×8 IMPLANT
YANKAUER SUCT BULB TIP NO VENT (SUCTIONS) ×4 IMPLANT

## 2016-06-05 NOTE — OR Nursing (Signed)
Chest incision at [facility]

## 2016-06-05 NOTE — Brief Op Note (Signed)
05/31/2016 - 06/05/2016  9:54 PM  PATIENT:  Kenneth [name]  60 y.o. male  PRE-OPERATIVE DIAGNOSIS:  CAD PT ON BALLOON PUMP  POST-OPERATIVE DIAGNOSIS:  CAD  PROCEDURE:  Procedure(s):  CORONARY ARTERY BYPASS GRAFTING x 3 -LIMA to LAD -SVG to OM -SVG to RCA  ENDOSCOPIC HARVEST GREATER SAPHENOUS VEIN -Left Leg  INTRAOPERATIVE TRANSESOPHAGEAL ECHOCARDIOGRAM (N/A)  INSERTION OF DIALYSIS/trialysis CATHETER (N/A)  SURGEON:  Surgeon(s) and Role: Panel 1:    * Ivin Poot, MD - Primary  Panel 2:    * Ivin Poot, MD - Primary  PHYSICIAN ASSISTANT: Ellwood Handler PA-C  ANESTHESIA:   general  EBL:  Total I/O In: 335 [Blood:335] Out: 100 [Urine:100]  BLOOD ADMINISTERED:3U PRBC, CELLSAVER and  FFP  DRAINS: Right and Left Pleural Chest Tubes, Mediastinal Chest drains   LOCAL MEDICATIONS USED:  NONE  SPECIMEN:  No Specimen  DISPOSITION OF SPECIMEN:  N/A  COUNTS:  YES  TOURNIQUET:  * No tourniquets in log *  DICTATION: .Dragon Dictation  PLAN OF CARE: Admit to inpatient   PATIENT DISPOSITION:  ICU - intubated and hemodynamically stable.   Delay start of Pharmacological VTE agent (>24hrs) due to surgical blood loss or risk of bleeding: yes

## 2016-06-05 NOTE — Progress Notes (Signed)
The patient was examined and preop studies reviewed. There has been no change from the prior exam and the patient is ready for surgery.   Plan CABG on J Coe for ischemic cardiomyopathy and CAD

## 2016-06-05 NOTE — Progress Notes (Signed)
Wasted 150 mg of fentanyl in sink with Lesli Albee, RN.

## 2016-06-05 NOTE — Progress Notes (Signed)
PULMONARY / CRITICAL CARE MEDICINE   Name: Kenneth [name] MRN: [id number] DOB: February 15, 1956    ADMISSION DATE:  05/31/2016 CONSULTATION DATE:  05/31/2016   REFERRING MD:  Dr. Dayna Barker, ER  CHIEF COMPLAINT:  Sob, resp failure  HISTORY OF PRESENT ILLNESS:   60 yo male male presented with dyspnea felt to be related to respiratory infection.  He had progressive orthopnea, chest discomfort, lower extremity edema, and respiratory failure from acute pulmonary edema with acute combined CHF.  PMHx of DM, HTN, HLD, CKD 4  SUBJECTIVE:  Dobutamine added.  Going to OR later today.  VITAL SIGNS: BP (!) 213/66   Pulse 66   Temp (!) 96.8 F (36 C)   Resp (!) 24   Ht 5\' 6"  (1.676 m)   Wt 175 lb 11.3 oz (79.7 kg)   SpO2 97%   BMI 28.36 kg/m   HEMODYNAMICS: PAP: (31-51)/(8-22) 43/15 CVP:  [2 mmHg-18 mmHg] 8 mmHg PCWP:  [13 mmHg-19 mmHg] 19 mmHg CO:  [4.3 L/min-8.2 L/min] 4.3 L/min CI:  [2.4 L/min/m2-3 L/min/m2] 2.4 L/min/m2  VENTILATOR SETTINGS: Vent Mode: PRVC FiO2 (%):  [40 %-50 %] 40 % Set Rate:  [18 bmp-24 bmp] 24 bmp Vt Set:  [520 mL] 520 mL PEEP:  [8 cmH20] 8 cmH20 Plateau Pressure:  [21 MWU13-24 cmH20] 24 cmH20  INTAKE / OUTPUT: I/O last 3 completed shifts: In: 4803.9 [I.V.:2511.4; NG/GT:2030; IV Piggyback:262.5] Out: 1934 [Urine:1930; Stool:4]  PHYSICAL EXAMINATION: General:  Sedated  Neuro: RASS -3 HEENT:  ETT in place Cardiovascular: regular, no murmur Lungs: basilar crackles, no wheeze Abdomen:  Soft, non tender Musculoskeletal:  2+ edema Skin: cool, no rashes  LABS:  BMET  Recent Labs Lab 06/03/16 0500 06/04/16 0453 06/05/16 0344  NA 137 138 139  K 4.5 3.6 3.8  CL 104 103 104  CO2 23 24 21*  BUN 85* 89* 94*  CREATININE 3.18* 3.57* 4.14*  GLUCOSE 295* 238* 186*    Electrolytes  Recent Labs Lab 06/01/16 0439 06/01/16 1650 06/02/16 0426 06/03/16 0500 06/04/16 0453 06/05/16 0344  CALCIUM 8.4* 8.1* 8.3* 7.8* 7.5* 7.7*  MG 1.9 1.7 1.8  --   --    --   PHOS 5.1* 5.4* 5.5*  --   --   --     CBC  Recent Labs Lab 06/03/16 1609 06/04/16 0453 06/05/16 0344  WBC 9.0 9.4 12.8*  HGB 7.3* 7.2* 8.6*  HCT 22.6* 22.5* 26.3*  PLT 125* 108* 134*    Coag's  Recent Labs Lab 05/31/16 1435 06/05/16 0344  APTT  --  120*  INR 0.96  --     Sepsis Markers  Recent Labs Lab 05/31/16 2345 06/01/16 0952  06/02/16 0830 06/03/16 0500 06/04/16 0453  LATICACIDVEN 1.0 0.8  --   --   --   --   PROCALCITON  --   --   < > 0.11 1.06 1.13  < > = values in this interval not displayed.  ABG  Recent Labs Lab 06/04/16 0410 06/04/16 2130 06/05/16 0350  PHART 7.404 7.358 7.297*  PCO2ART 37.6 40.1 46.4  PO2ART 145* 96.4 82.5*    Liver Enzymes  Recent Labs Lab 06/03/16 0500 06/04/16 0453 06/05/16 0344  AST 18 30 29   ALT 36 33 35  ALKPHOS 84 93 112  BILITOT 0.5 0.7 0.5  ALBUMIN 2.0* 1.6* 1.7*    Cardiac Enzymes  Recent Labs Lab 05/31/16 1824 05/31/16 2311 06/01/16 0439  TROPONINI 1.68* 2.46* 2.90*    Glucose  Recent  Labs Lab 06/05/16 0107 06/05/16 0210 06/05/16 0314 06/05/16 0404 06/05/16 0510 06/05/16 0605  GLUCAP 196* 198* 180* 176* 154* 141*    Imaging Dg Chest Port 1 View  Result Date: 06/05/2016 CLINICAL DATA:  IABP placement EXAM: PORTABLE CHEST 1 VIEW COMPARISON:  06/04/2016 FINDINGS: Endotracheal tube with tip measuring 5.2 cm above the carina. Enteric tube tip is off the field of view but below the left hemidiaphragm. Right Swan-Ganz catheter with tip over the pulmonary outflow tract. IABP marker positioned over the upper descending aorta just below the aortic arch. Normal heart size and pulmonary vascularity. Bilateral pleural effusions with bibasilar infiltration or edema. No pneumothorax. Appearances are similar to previous study. IMPRESSION: Appliances appear in satisfactory position. Bilateral pleural effusions with bilateral basilar infiltration or edema. Electronically Signed   By: Lucienne Capers M.D.   On: 06/05/2016 05:44   STUDIES:  Echo 9/17 >> EF 35 to 40%, grade 2 DD [facility] 9/17 >> severe 3 vessel disease Rt thoracentesis 9/19 >> glucose 168, protein < 3, LDH 61, WBC 117  CULTURES: Blood 9/19 >> Sputum 9/19 >> Few Serratia marcescens Rt pleural fluid 9/19 >>   ANTIBIOTICS: Vancomycin 9/19 >> 9/21 Zosyn 9/19 >>  SIGNIFICANT EVENTS: 9/17 from [facility] to [place], [place], Cardiology consulted, IABP 9/18 TCTS consulted, a fib with RVR >> amiodarone bolus  LINES/TUBES: 9/17 ETT >>  9/17 IABP >> 9/17 Rt IJ Introducer >>   ASSESSMENT / PLAN:  PULMONARY A: Acute hypoxic respiratory failure 2nd to acute pulmonary edema, pleural effusions, pneumonia >> oxygenation improved after b/l thoracentesis. Respiratory acidosis. P:   Full vent support Adjust PEEP/FiO2 to keep SpO2 > 92% F/u CXR, ABG  CARDIOVASCULAR A:  Cardiogenic shock. Acute combined CHF. 3 vessel CAD. A fib with RVR >> better 9/19 P:  Continue ASA, lipitor IABP, PA catheter per cardiology Heparin, dobutamine gtt per cardiology Hydralazine, lasix, isordil, amiodarone per cardiology For CABG 9/22  RENAL A:   CKD 4 >> baseline creatinine 2.57 from 05/21/16. P:   Renal consulted 9/22  GASTROINTESTINAL A:   Nutrition. P:   NPO in anticipation of surgery Protonix for SUP  HEMATOLOGIC A:   Anemia of critical illness, chronic disease. P:  F/u CBC  INFECTIOUS A:   Pneumonia with Serratia in sputum cx. P:   Day 4 of zosyn  ENDOCRINE A:   DM. P:   Insulin gtt  NEUROLOGIC A:   Acute metabolic encephalopathy. P:   RASS goal: 0 to -1  CC time 32 minutes.  Chesley Mires, MD [facility] Pulmonary/Critical Care 06/05/2016, 9:00 AM Pager:  [phone] After 3pm call: [phone]

## 2016-06-05 NOTE — Progress Notes (Addendum)
Patient ID: Kenneth [name], male   DOB: 1956-01-07, 60 y.o.   MRN: [id number]     Advanced Heart Failure Rounding Note  Referring Physician: Raylene Miyamoto, MD  Primary Physician: Rodena Medin, MD Primary HF: New ( Dr Aundra Dubin)  Reason for Consultation: Acute respiratory failure  Subjective:    Kenneth [name] is a 60 y.o. male with hx of poorly controlled diabetes, HTN, CKD IV, and HLD.  Admitted 05/31/16 with orthopnea, DOE, and CP. EKG did not meet stemi criteria.  Troponin 1.4. Echo showed global LV dysfunction. Started on NIMV for acute respiratory failure, failed requiring intubation.   Echo 05/31/16 LVEF 35-40%, Grade 2 DD  LHC 05/31/16 showed LM lesion, 40 %stenosed, Ramus lesion, 80 %stenosed, Ost Cx to Prox Cx lesion, 80 %stenosed, Mid LAD lesion, 90 %stenosed, Ost 2nd Diag to 2nd Diag lesion, 90 %stenosed, Prox RCA to Mid RCA lesion, 100 %stenosed.  IABP inserted. Cardiac surgery consulted, as well as CHF team.   Atrial fibrillation with RVR developed 9/18, now on amiodarone gtt and in NSR.   Thoracentesis on right on 9/19, looks transudative (1000 cc off).   Thoracentesis on left on 9/21, 600 cc off.   Remains intubated/sedated and on IABP at 1:1 support this am. Afebrile today.  Urine output and renal function continue to worsen, creatinine up to 53%.  CI remains adequate from [place] but co-ox down from 63% yesterday to 53% this morning.   1 unit PBCs yesterday with appropriate bump in hemoglobin to 8.6.   Swan numbers this morning CVP 14 PA 46/14 Mean PCWP 19 CI 2.4 Co-ox 53%  Objective:   Weight Range: 175 lb 11.3 oz (79.7 kg) Body mass index is 28.36 kg/m.   Vital Signs:   Temp:  [97 F (36.1 C)-100.6 F (38.1 C)] 97 F (36.1 C) (09/22 0730) Pulse Rate:  [45-80] 50 (09/22 0730) Resp:  [15-24] 23 (09/22 0730) BP: (101-146)/(43-92) 115/55 (09/22 0730) SpO2:  [90 %-100 %] 99 % (09/22 0730) Arterial Line BP: (102-211)/(35-71) 149/53 (09/22 0730) FiO2 (%):  [40  %-50 %] 40 % (09/22 0730) Weight:  [175 lb 11.3 oz (79.7 kg)] 175 lb 11.3 oz (79.7 kg) (09/22 0500) Last BM Date: 06/04/16  Weight change: Filed Weights   06/03/16 0300 06/04/16 0500 06/05/16 0500  Weight: 159 lb 13.3 oz (72.5 kg) 166 lb 10.7 oz (75.6 kg) 175 lb 11.3 oz (79.7 kg)    Intake/Output:   Intake/Output Summary (Last 24 hours) at 06/05/16 0748 Last data filed at 06/05/16 0615  Gross per 24 hour  Intake          3048.55 ml  Output             1329 ml  Net          1719.55 ml     Physical Exam: CVP 14 General:  Intubated and sedated HEENT: ET tube + [place] Neck: supple. JVP elevated. Carotids 2+ bilat; no bruits. No thyromegaly or nodule noted.  Cor: PMI nondisplaced. Regular rate & rhythm. No rubs, gallops or murmurs. Lungs: Decreased breath sounds on left.   Abdomen: soft, ND, no HSM. No bruits or masses. +BS  Extremities: no cyanosis, clubbing, rash. 1-2+ ankle edema. R groin IABP stable.  Neuro: alert & orientedx3, cranial nerves grossly intact. moves all 4 extremities w/o difficulty. Affect pleasant  Telemetry: Reviewed, NSR  Labs: CBC  Recent Labs  06/04/16 0453 06/05/16 0344  WBC 9.4 12.8*  HGB 7.2* 8.6*  HCT 22.5* 26.3*  MCV 91.8 90.4  PLT 108* 161*   Basic Metabolic Panel  Recent Labs  06/04/16 0453 06/05/16 0344  NA 138 139  K 3.6 3.8  CL 103 104  CO2 24 21*  GLUCOSE 238* 186*  BUN 89* 94*  CREATININE 3.57* 4.14*  CALCIUM 7.5* 7.7*   Liver Function Tests  Recent Labs  06/04/16 0453 06/05/16 0344  AST 30 29  ALT 33 35  ALKPHOS 93 112  BILITOT 0.7 0.5  PROT 4.2* 5.0*  ALBUMIN 1.6* 1.7*   No results for input(s): LIPASE, AMYLASE in the last 72 hours. Cardiac Enzymes No results for input(s): CKTOTAL, CKMB, CKMBINDEX, TROPONINI in the last 72 hours.  BNP: BNP (last 3 results)  Recent Labs  05/31/16 1435  BNP 2,537.9*    ProBNP (last 3 results) No results for input(s): PROBNP in the last 8760 hours.   D-Dimer No  results for input(s): DDIMER in the last 72 hours. Hemoglobin A1C  Recent Labs  06/04/16 1630  HGBA1C 10.4*   Fasting Lipid Panel  Recent Labs  06/03/16 1609  TRIG 132   Thyroid Function Tests No results for input(s): TSH, T4TOTAL, T3FREE, THYROIDAB in the last 72 hours.  Invalid input(s): FREET3  Other results:     Imaging/Studies:  Dg Chest Port 1 View  Result Date: 06/05/2016 CLINICAL DATA:  IABP placement EXAM: PORTABLE CHEST 1 VIEW COMPARISON:  06/04/2016 FINDINGS: Endotracheal tube with tip measuring 5.2 cm above the carina. Enteric tube tip is off the field of view but below the left hemidiaphragm. Right Swan-Ganz catheter with tip over the pulmonary outflow tract. IABP marker positioned over the upper descending aorta just below the aortic arch. Normal heart size and pulmonary vascularity. Bilateral pleural effusions with bibasilar infiltration or edema. No pneumothorax. Appearances are similar to previous study. IMPRESSION: Appliances appear in satisfactory position. Bilateral pleural effusions with bilateral basilar infiltration or edema. Electronically Signed   By: Lucienne Capers M.D.   On: 06/05/2016 05:44   Dg Chest Port 1 View  Result Date: 06/04/2016 CLINICAL DATA:  Respiratory failure. EXAM: PORTABLE CHEST 1 VIEW COMPARISON:  Radiograph of June 03, 2016. FINDINGS: Stable cardiomediastinal silhouette. Endotracheal and nasogastric tubes are unchanged in position. Right internal jugular Swan-Ganz catheter is unchanged with distal tip in expected position of main pulmonary artery. Distal tip of aortic balloon catheter is unchanged in expected position of aortic arch. Atherosclerosis of thoracic aorta is noted. No pneumothorax is noted. Bibasilar opacities are noted concerning for atelectasis or edema with probable mild right pleural effusion. Bony thorax is unremarkable. IMPRESSION: Stable support apparatus. Stable bibasilar edema or atelectasis is noted with  probable mild right pleural effusion. Electronically Signed   By: Marijo Conception, M.D.   On: 06/04/2016 08:21   Dg Chest Port 1 View  Result Date: 06/03/2016 CLINICAL DATA:  Post procedure film for left-sided thoracentesis EXAM: PORTABLE CHEST 1 VIEW COMPARISON:  06/03/2016 FINDINGS: ET tube tip is above the carina. There is a Swan-Ganz catheter with tip in the right ventricular outflow tract. Nasogastric tube tip is identified with side port below GE junction. The intra-aortic balloon pump is identified at the approximate T6 level. Heart size is normal. There has been interval improved aeration to the entire left lung status post thoracentesis. No significant pneumothorax identified. Thickening of the minor fissure within the right midlung is noted. Persistent airspace opacities within the right base. IMPRESSION: Decrease in volume of left pleural effusion with improved aeration to  the entire left lung status post thoracentesis. No pneumothorax. Electronically Signed   By: Kerby Moors M.D.   On: 06/03/2016 12:22    Latest Echo  Latest Cath   Medications:     Scheduled Medications: . aminocaproic acid (AMICAR) for OHS   Intravenous To OR  . aspirin  81 mg Oral Daily  . atorvastatin  80 mg Oral q1800  . bisacodyl  5 mg Oral Once  . cefUROXime (ZINACEF)  IV  1.5 g Intravenous To OR  . cefUROXime (ZINACEF)  IV  750 mg Intravenous To OR  . chlorhexidine  15 mL Mouth/Throat Once  . chlorhexidine gluconate (MEDLINE KIT)  15 mL Mouth Rinse BID  . Chlorhexidine Gluconate Cloth  6 each Topical Daily  . dexmedetomidine  0.1-0.7 mcg/kg/hr Intravenous To OR  . DOPamine  0-10 mcg/kg/min Intravenous To OR  . epinephrine  0-10 mcg/min Intravenous To OR  . furosemide  80 mg Intravenous BID  . heparin-papaverine-plasmalyte irrigation   Irrigation To OR  . heparin 30,000 units/NS 1000 mL solution for CELLSAVER   Other To OR  . hydrALAZINE  12.5 mg Oral Q8H  . insulin (NOVOLIN-R) infusion    Intravenous To OR  . isosorbide dinitrate  5 mg Oral TID  . magnesium sulfate  40 mEq Other To OR  . mouth rinse  15 mL Mouth Rinse 10 times per day  . metoprolol tartrate  12.5 mg Oral Once  . mupirocin ointment  1 application Nasal BID  . nitroGLYCERIN  2-200 mcg/min Intravenous To OR  . pantoprazole sodium  40 mg Per Tube Q24H  . phenylephrine (NEO-SYNEPHRINE) Adult infusion  30-200 mcg/min Intravenous To OR  . piperacillin-tazobactam (ZOSYN)  IV  3.375 g Intravenous Q12H  . potassium chloride  80 mEq Other To OR  . sodium chloride flush  10-40 mL Intracatheter Q12H  . vancomycin  1,250 mg Intravenous To OR    Infusions: . sodium chloride 10 mL/hr at 06/04/16 0800  . amiodarone 30 mg/hr (06/04/16 2311)  . DOBUTamine    . feeding supplement (VITAL AF 1.2 CAL) Stopped (06/05/16 0000)  . fentaNYL infusion INTRAVENOUS 325 mcg/hr (06/05/16 0724)  . heparin 850 Units/hr (06/04/16 1942)  . insulin (NOVOLIN-R) infusion 2.4 Units/hr (06/05/16 0700)  . propofol (DIPRIVAN) infusion 10 mcg/kg/min (06/04/16 0800)    PRN Medications: sodium chloride, Place/Maintain arterial line **AND** sodium chloride, acetaminophen (TYLENOL) oral liquid 160 mg/5 mL, fentaNYL, Influenza vac split quadrivalent PF, ondansetron (ZOFRAN) IV, sodium chloride flush, temazepam   Assessment/Plan   Kenneth [name] is a 60 y.o. male with hx of poorly controlled diabetes, HTN, CKD IV, and HLD.  Admitted 05/31/16 with orthopnea, DOE, and CP.  Intubated, sedated, placed on IABP.  HF team consulted for optimization. Cardiac surgery consulted as well with 3 vessel disease.    1. Acute on chronic systolic CHF: EF 67-34% with wall motion abnormalities, ischemic cardiomyopathy.  IABP in place 1:1.  I>>O yesterday with rise in creatinine again.  BP stable.  Filling pressures rising, PCWP 19 today.  Cardiac index preserved off swan, CI 2.4, but co-ox down from 63% to 53%.   - Keep IABP in place at 1:1. - Add dobutamine 2.5  mcg/kg/min with low co-ox (would use this rather than milrinone with renal failure).  - Filling pressures higher, continue IV Lasix but suspect he will need CVVH at least temporarily around the time of CABG. - Stop hydralazine/isordil for now.  2. CAD: Admitted with NSTEMI, severe 3VD.  -  ASA, atorvastatin 80.  - CVTS following, tentatively for CABG round mid day.  3. AKI on CKD stage IV.  Creatinine up to 4.1 with sluggish UOP.  Baseline seems to be aroud 2.5.  I suspect that he will need CVVH around the time of his surgery.  Will consult renal. 4. Acute respiratory failure: right thoracentesis and left thoracentesis done.  Lungs more clear. 5. Anemia: Right groin IABP site stable. Appropriate increase in hemoglobin with 1 unit transfusion.  6. ID: Afebrile now, On vanco/Zosyn, ?PNA.  Serratia in sputum.     35 minutes critical care time.   Length of Stay: [place]  06/05/2016, 7:48 AM  Advanced Heart Failure Team Pager [phone] (M-F; 7a - 4p)  Please contact [facility] for night-coverage after hours (4p -7a ) and weekends on [email or web address]

## 2016-06-05 NOTE — Anesthesia Preprocedure Evaluation (Signed)
Anesthesia Evaluation  Patient identified by MRN, date of birth, ID band Patient unresponsive    Reviewed: Patient's Chart, lab work & pertinent test results, Unable to perform ROS - Chart review only  Airway Mallampati: Intubated       Dental   Pulmonary former smoker,     + decreased breath sounds      Cardiovascular  Rhythm:Regular Rate:Normal     Neuro/Psych    GI/Hepatic   Endo/Other    Renal/GU      Musculoskeletal   Abdominal   Peds  Hematology   Anesthesia Other Findings   Reproductive/Obstetrics                             Anesthesia Physical Anesthesia Plan  ASA: III  Anesthesia Plan: General   Post-op Pain Management:    Induction: Intravenous  Airway Management Planned: Oral ETT  Additional Equipment: PA Cath, 3D TEE, Arterial line and CVP  Intra-op Plan:   Post-operative Plan: Post-operative intubation/ventilation  Informed Consent: I have reviewed the patients History and Physical, chart, labs and discussed the procedure including the risks, benefits and alternatives for the proposed anesthesia with the patient or authorized representative who has indicated his/her understanding and acceptance.     Plan Discussed with: CRNA and Anesthesiologist  Anesthesia Plan Comments:         Anesthesia Quick Evaluation

## 2016-06-05 NOTE — Progress Notes (Signed)
ANTICOAGULATION f/u [place] for Heparin Indication: chest pain/ACS; now with IABP in place  Assessment: 60 year old male s/p cath which showed severe 3V CAD, IABP placed in cath lab, continues today. Plan for CABG this afternoon.  Heparin level therapeutic on gtt at 850 units/hr. No issues with line or bleeding reported per RN. Hgb improved with 1 unit yesterday, pltc improved to 134   Goal of Therapy:  Heparin level 0.2-0.5 on IABP Monitor platelets by anticoagulation protocol: Yes   Plan:  1. Continue IV heparin to 850 units/hr 2. Daily heparin level and CBC 3. Surgery this afternoon  Allergies  Allergen Reactions  . No Known Allergies     Patient Measurements: Height: 5\' 6"  (167.6 cm) Weight: 175 lb 11.3 oz (79.7 kg) IBW/kg (Calculated) : 63.8 Heparin Dosing Weight: 63.5 kg  Vital Signs: Temp: 97 F (36.1 C) (09/22 0700) Temp Source: [place] (Comment) (09/21 2000) BP: 115/55 (09/22 0700) Pulse Rate: 49 (09/22 0700)  Labs:  Recent Labs  06/03/16 0434  06/03/16 0500 06/03/16 1609 06/04/16 0453 06/05/16 0344  HGB  --   < > 8.0* 7.3* 7.2* 8.6*  HCT  --   < > 25.5* 22.6* 22.5* 26.3*  PLT  --   < > 152 125* 108* 134*  APTT  --   --   --   --   --  120*  HEPARINUNFRC 0.33  --   --   --  0.27* 0.30  CREATININE  --   --  3.18*  --  3.57* 4.14*  < > = values in this interval not displayed. Estimated Creatinine Clearance: 19.3 mL/min (by C-G formula based on SCr of 4.14 mg/dL (H)).  Medical History: No past medical history on file.   Erin Hearing PharmD., BCPS Clinical Pharmacist Pager [phone] 06/05/2016 7:12 AM

## 2016-06-05 NOTE — Anesthesia Procedure Notes (Signed)
Procedures

## 2016-06-05 NOTE — Consult Note (Signed)
[facility] Consultation Note Requesting Physician:  Dr. Aundra Dubin Primary Nephrologist: Not known (followed in [place] lists Dr. Gerarda Gunther, [address], [phone]) Reason for Consult:  AKI on CKD4  HPI: History is obtained through medical records, including Care Everywhere.  The patient is a 60 y.o. year-old [place] man with PMH of HTN (on ARB PTA), DM (poorly controlled A1C 11.8 05/15/16), HLD, CKD 4 , history of bilateral DVT. He presented to the ED on 9/17 with several days of SOB, leg and abd swelling. Admission findings included creatinine of 2.69, K 5.8, pH 7.2 with bicarb 16Hb 10.1, elevated troponins->NSTEMI. Required intubation for respiratory failure with pulmonary edema/bilateral effusions. ECHO  LVEF 35-40%, Grade 2 DD. 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 IABP was inserted. Course  Complicated by AF with RVR.  Heart failure team and surgery have been managing with plans for CABG later today.  We are asked to see because of progressive CKD - appears has CKD4 at baseline, creatinine 2.57-2.71 (05/2016) from [facility] Everywhere, 2.59 on admission (with ~3 gm proteinuria),  and over last 4 days has serially risen to 4.14 with a reduction in UOP.today from around 3-4 liters/day ->only 1.3 liters so far today.  Filling pressures have been rising PCWP 19 today. Has been maintained on IV lasix but felt he may require CRRT in the perioperative period.  He does not have a dialysis access,  Creatinine summary for reference: Date/Time Value  06/05/2016  4.14   06/04/2016  3.57   06/03/2016  3.18   06/02/2016  2.59   06/01/2016  2.58  06/01/2016  2.66   05/31/2016  2.84   05/21/16 2.57  ([facility])  05/15/16  2.71  ([facility])    Past Medical History: No past medical history on file.   Past Surgical History:  Procedure Laterality Date  . CARDIAC CATHETERIZATION N/A 05/31/2016   Procedure: Left Heart Cath and Coronary Angiography;  Surgeon: Jettie Booze, MD;  Location: [facility];   Service: Cardiovascular;  Laterality: N/A;  . CARDIAC CATHETERIZATION N/A 05/31/2016   Procedure: Right Heart Cath;  Surgeon: Jettie Booze, MD;  Location: [facility];  Service: Cardiovascular;  Laterality: N/A;  . CARDIAC CATHETERIZATION N/A 05/31/2016   Procedure: IABP Insertion;  Surgeon: Jettie Booze, MD;  Location: [facility];  Service: Cardiovascular;  Laterality: N/A;    Family History: No family history on file. Social History:  reports that he has quit smoking. He has never used smokeless tobacco. His alcohol and drug histories are not on file.   Allergies  Allergen Reactions  . No Known Allergies     Home medications: Prior to Admission medications   Medication Sig Start Date End Date Taking? Authorizing Provider  albuterol (PROVENTIL HFA;VENTOLIN HFA) 108 (90 Base) MCG/ACT inhaler Inhale 2 puffs into the lungs every 6 (six) hours as needed for wheezing.   Yes Historical Provider, MD  atorvastatin (LIPITOR) 40 MG tablet Take 40 mg by mouth at bedtime.  05/14/16  Yes Historical Provider, MD  doxycycline (VIBRA-TABS) 100 MG tablet Take 100 mg by mouth 2 (two) times daily. 7 day course filled 05/25/16 05/25/16  Yes Historical Provider, MD  gabapentin (NEURONTIN) 300 MG capsule Take 1 capsule (300 mg total) by mouth 3 (three) times daily. Patient taking differently: Take 900 mg by mouth at bedtime.  03/29/15  Yes Myeong O Sheard, DPM  ibuprofen (ADVIL,MOTRIN) 200 MG tablet Take 400 mg by mouth every 6 (six) hours  as needed (pain).   Yes Historical Provider, MD  Insulin Human (INSULIN PUMP) SOLN Inject into the skin continuous. Novolog   Yes Historical Provider, MD  losartan-hydrochlorothiazide (HYZAAR) 50-12.5 MG tablet Take 1 tablet by mouth at bedtime.  05/26/16  Yes Historical Provider, MD  Multiple Vitamin (MULTIVITAMIN WITH MINERALS) TABS tablet Take 1 tablet by mouth at bedtime.   Yes Historical Provider, MD  lidocaine (LIDODERM) 5 % Place 1 patch onto  the skin daily. Remove & Discard patch within 12 hours or as directed by MD Patient not taking: Reported on 05/31/2016 02/06/15   Myeong Christianne Dolin, DPM    Inpatient medications: . aminocaproic acid (AMICAR) for OHS   Intravenous To OR  . aspirin  81 mg Oral Daily  . atorvastatin  80 mg Oral q1800  . bisacodyl  5 mg Oral Once  . cefUROXime (ZINACEF)  IV  1.5 g Intravenous To OR  . cefUROXime (ZINACEF)  IV  750 mg Intravenous To OR  . chlorhexidine gluconate (MEDLINE KIT)  15 mL Mouth Rinse BID  . dexmedetomidine  0.1-0.7 mcg/kg/hr Intravenous To OR  . DOPamine  0-10 mcg/kg/min Intravenous To OR  . epinephrine  0-10 mcg/min Intravenous To OR  . furosemide  80 mg Intravenous BID  . heparin 30,000 units/NS 1000 mL solution for CELLSAVER   Other To OR  . hydrALAZINE      . insulin (NOVOLIN-R) infusion   Intravenous To OR  . magnesium sulfate  40 mEq Other To OR  . mouth rinse  15 mL Mouth Rinse 10 times per day  . mupirocin ointment  1 application Nasal BID  . nitroGLYCERIN  2-200 mcg/min Intravenous To OR  . pantoprazole sodium  40 mg Per Tube Q24H  . phenylephrine (NEO-SYNEPHRINE) Adult infusion  30-200 mcg/min Intravenous To OR  . piperacillin-tazobactam (ZOSYN)  IV  3.375 g Intravenous Q12H  . potassium chloride  80 mEq Other To OR  . sodium chloride flush  10-40 mL Intracatheter Q12H  . vancomycin  1,250 mg Intravenous To OR   . sodium chloride 10 mL/hr at 06/04/16 0800  . amiodarone 30 mg/hr (06/04/16 2311)  . DOBUTamine 2.5 mcg/kg/min (06/05/16 0841)  . feeding supplement (VITAL AF 1.2 CAL) Stopped (06/05/16 0000)  . fentaNYL infusion INTRAVENOUS 375 mcg/hr (06/05/16 1537)  . heparin 850 Units/hr (06/04/16 1942)  . insulin (NOVOLIN-R) infusion 0.4 Units/hr (06/05/16 1702)  . propofol (DIPRIVAN) infusion 25 mcg/kg/min (06/05/16 1639)   Review of Systems Unobtainable as pt intubated and sedated  Physical Exam:  Blood pressure (!) 112/58, pulse (!) 59, temperature 97.9 F (36.6  C), resp. rate (!) 24, height 5\' 6"  (1.676 m), weight 79.7 kg (175 lb 11.3 oz), SpO2 99 %.  Hemodynamics: PAP 47/21 CVP 13 PCWP 19 CI 3.3 Gen: Intubated and sedated. Lines/tubes: R IJ swan, OTT, IABP R fem, peripherals both arms Generalized anasarca No skin rashes Cannot see his neck veins Breath sounds diminshed L>R Regular S1S2 Cannot hear murmur or rub Abd quiet (can only hear the balloon pump) R LE immobilized with balloon pump R groin, foot warm Foley small amount yellow urine   Weight Trending 06/05/16   79.7 kg (175 lg)   06/04/16   75.6 kg (166 lb 10.7 oz)   06/03/16   72.5 kg (159 lb 13.3 oz)   06/02/16   72 kg (158 lb 11.7 oz)   06/01/16  72.3 kg (159 lb 6.3 oz)   05/31/16   73.9 kg (162 lb  14.7 oz)   05/31/16   63.5 kg (140 lb    Labs:  Recent Labs Lab 05/31/16 1824 06/01/16 0439 06/01/16 1650 06/02/16 0426 06/03/16 0500 06/04/16 0453 06/05/16 0344  NA 135 137 137 138 137 138 139  K 5.8* 4.9 4.1 4.4 4.5 3.6 3.8  CL 110 112* 111 109 104 103 104  CO2 16* 18* 19* 19* 23 24 21*  GLUCOSE 286* 172* 127* 161* 295* 238* 186*  BUN 76* 80* 82* 81* 85* 89* 94*  CREATININE 2.84* 2.66* 2.58* 2.59* 3.18* 3.57* 4.14*  CALCIUM 8.2* 8.4* 8.1* 8.3* 7.8* 7.5* 7.7*  PHOS 4.7* 5.1* 5.4* 5.5*  --   --   --      Recent Labs Lab 06/03/16 0500 06/04/16 0453 06/05/16 0344  AST '18 30 29  '$ ALT 36 33 35  ALKPHOS 84 93 112  BILITOT 0.5 0.7 0.5  PROT 4.7* 4.2* 5.0*  ALBUMIN 2.0* 1.6* 1.7*     Recent Labs Lab 06/03/16 0500 06/03/16 1609 06/04/16 0453 06/05/16 0344  WBC 13.6* 9.0 9.4 12.8*  HGB 8.0* 7.3* 7.2* 8.6*  HCT 25.5* 22.6* 22.5* 26.3*  MCV 92.7 90.4 91.8 90.4  PLT 152 125* 108* 134*    ) Recent Labs Lab 05/31/16 1435 05/31/16 1824 05/31/16 2311 06/01/16 0439  TROPONINI 1.43* 1.68* 2.46* 2.90*     Recent Labs Lab 06/05/16 1158 06/05/16 1252 06/05/16 1354 06/05/16 1503 06/05/16 1618  GLUCAP 182* 153* 129* 103* 109*    Xrays/Other  Studies: Dg Chest Port 1 View  Result Date: 06/05/2016 CLINICAL DATA:  IABP placement EXAM: PORTABLE CHEST 1 VIEW COMPARISON:  06/04/2016 FINDINGS: Endotracheal tube with tip measuring 5.2 cm above the carina. Enteric tube tip is off the field of view but below the left hemidiaphragm. Right Swan-Ganz catheter with tip over the pulmonary outflow tract. IABP marker positioned over the upper descending aorta just below the aortic arch. Normal heart size and pulmonary vascularity. Bilateral pleural effusions with bibasilar infiltration or edema. No pneumothorax. Appearances are similar to previous study. IMPRESSION: Appliances appear in satisfactory position. Bilateral pleural effusions with bilateral basilar infiltration or edema. Electronically Signed   By: Lucienne Capers M.D.   On: 06/05/2016 05:44   Dg Chest Port 1 View  Result Date: 06/04/2016 CLINICAL DATA:  Respiratory failure. EXAM: PORTABLE CHEST 1 VIEW COMPARISON:  Radiograph of June 03, 2016. FINDINGS: Stable cardiomediastinal silhouette. Endotracheal and nasogastric tubes are unchanged in position. Right internal jugular Swan-Ganz catheter is unchanged with distal tip in expected position of main pulmonary artery. Distal tip of aortic balloon catheter is unchanged in expected position of aortic arch. Atherosclerosis of thoracic aorta is noted. No pneumothorax is noted. Bibasilar opacities are noted concerning for atelectasis or edema with probable mild right pleural effusion. Bony thorax is unremarkable. IMPRESSION: Stable support apparatus. Stable bibasilar edema or atelectasis is noted with probable mild right pleural effusion. Electronically Signed   By: Marijo Conception, M.D.   On: 06/04/2016 08:21    Background  60 y.o. year-old [place] man with PMH of HTN (on ARB PTA), DM (poorly controlled A1C 11.8 05/15/16), HLD, CKD 4 , history of bilateral DVT. He presented to the ED on 9/17 with several days of SOB, leg and abd swelling. Admission  findings included creatinine of 2.69, K 5.8, pH 7.2 with bicarb 16Hb 10.1, elevated troponins->NSTEMI. Required intubation for respiratory failure with pulmonary edema/bilateral effusions. ECHO  LVEF 35-40%, Grade 2 DD. 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 IABP was inserted. Course  Complicated by AF with RVR.  Heart failure team and surgery have been managing with plans for CABG later today.  We are asked to see because of progressive CKD - appears has CKD4 at baseline, creatinine 2.57-2.71 (05/2016) from [facility], 2.59 on admission (with ~3 gm proteinuria),  and over last 4 days has serially risen to 4.14 with a reduction in UOP.today from around 3-4 liters/day ->only 1.3 liters so far today.  Filling pressures have been rising PCWP 19 today. Has been maintained on IV lasix but felt he may require CRRT in the perioperative period.   Assessment/Recommendations  1. CAD/NSTEMI - multivessel CAD with systolic CHF. Severe 3v ds by cath. For CABG today 2. AKI on CKD4 - not oliguric but UOP has dropped off some and creatinine steadily rising. Weight has increased 6 kg over the past 5 days and filling pressures higher as well. K and acid base are fine. Currently no emergent needs for renal replacement therapy but anticipate will require CRRT postoperatively. Will follow for indications. 3. [place] shows name of [place] nephrologist - office currently closed. Will get records on Monday. Not sure what education has been done. Presume 2/2 DM (3 gm proteinuria, no [place] available). Clarify workup.  4. Acute resp failure - vent management per CCM. S/p bilat thoracenteses by CCM this adm for transudative pleural effusions. 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 Anemia - transfused 9/21, 9/22. Anemia predated admission (9.1 on 05/15/16). Check iron studies.  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 Check PTH/meds as appropriate  Thanks for consult. Will follow closely w/you.   Jamal Maes,  MD [facility] [phone] pager 06/05/2016, 4:27 PM

## 2016-06-05 NOTE — Progress Notes (Signed)
Inpatient Diabetes Program Recommendations  AACE/ADA: New Consensus Statement on Inpatient Glycemic Control (2015)  Target Ranges:  Prepandial:   less than 140 mg/dL      Peak postprandial:   less than 180 mg/dL (1-2 hours)      Critically ill patients:  140 - 180 mg/dL   Lab Results  Component Value Date   GLUCAP 182 (H) 06/05/2016   HGBA1C 10.4 (H) 06/04/2016    Review of Glycemic Control Results for Kenneth [name], Kenneth [name] (MRN [id number]) as of 06/05/2016 11:32  Ref. Range 06/05/2016 08:00 06/05/2016 09:05 06/05/2016 10:17 06/05/2016 11:13  Glucose-Capillary Latest Ref Range: 65 - 99 mg/dL 95 148 (H) 144 (H) 182 (H)    Diabetes history: DM2  Outpatient Diabetes medications: Novolog insulin via Insulin Pump  Current orders for Inpatient glycemic control: Glucostabilizer  Inpatient Diabetes Program Recommendations: Agree with current glucose control plan.  Thank you,  Windy Carina, RN, BSN Diabetes Coordinator Inpatient Diabetes Program [phone] (Team Pager) [phone] ([facility] office) [phone] [facility] office) [phone] [facility] office)

## 2016-06-06 ENCOUNTER — Inpatient Hospital Stay (HOSPITAL_COMMUNITY): Payer: BLUE CROSS/BLUE SHIELD

## 2016-06-06 LAB — BASIC METABOLIC PANEL
Anion gap: 13 (ref 5–15)
BUN: 85 mg/dL — AB (ref 6–20)
CALCIUM: 7.4 mg/dL — AB (ref 8.9–10.3)
CO2: 23 mmol/L (ref 22–32)
CREATININE: 4.18 mg/dL — AB (ref 0.61–1.24)
Chloride: 107 mmol/L (ref 101–111)
GFR calc Af Amer: 17 mL/min — ABNORMAL LOW (ref >60)
GFR, EST NON AFRICAN AMERICAN: 14 mL/min — AB (ref >60)
Glucose, Bld: 142 mg/dL — ABNORMAL HIGH (ref 65–99)
Potassium: 4.4 mmol/L (ref 3.5–5.1)
SODIUM: 143 mmol/L (ref 135–145)

## 2016-06-06 LAB — GLUCOSE, CAPILLARY
GLUCOSE-CAPILLARY: 111 mg/dL — AB (ref 65–99)
Glucose-Capillary: 106 mg/dL — ABNORMAL HIGH (ref 65–99)
Glucose-Capillary: 107 mg/dL — ABNORMAL HIGH (ref 65–99)
Glucose-Capillary: 108 mg/dL — ABNORMAL HIGH (ref 65–99)
Glucose-Capillary: 110 mg/dL — ABNORMAL HIGH (ref 65–99)
Glucose-Capillary: 111 mg/dL — ABNORMAL HIGH (ref 65–99)
Glucose-Capillary: 114 mg/dL — ABNORMAL HIGH (ref 65–99)
Glucose-Capillary: 115 mg/dL — ABNORMAL HIGH (ref 65–99)
Glucose-Capillary: 116 mg/dL — ABNORMAL HIGH (ref 65–99)
Glucose-Capillary: 116 mg/dL — ABNORMAL HIGH (ref 65–99)
Glucose-Capillary: 117 mg/dL — ABNORMAL HIGH (ref 65–99)
Glucose-Capillary: 119 mg/dL — ABNORMAL HIGH (ref 65–99)
Glucose-Capillary: 124 mg/dL — ABNORMAL HIGH (ref 65–99)
Glucose-Capillary: 128 mg/dL — ABNORMAL HIGH (ref 65–99)
Glucose-Capillary: 131 mg/dL — ABNORMAL HIGH (ref 65–99)
Glucose-Capillary: 140 mg/dL — ABNORMAL HIGH (ref 65–99)
Glucose-Capillary: 144 mg/dL — ABNORMAL HIGH (ref 65–99)
Glucose-Capillary: 153 mg/dL — ABNORMAL HIGH (ref 65–99)
Glucose-Capillary: 153 mg/dL — ABNORMAL HIGH (ref 65–99)
Glucose-Capillary: 84 mg/dL (ref 65–99)
Glucose-Capillary: 90 mg/dL (ref 65–99)
Glucose-Capillary: 92 mg/dL (ref 65–99)

## 2016-06-06 LAB — CBC
HCT: 38 % — ABNORMAL LOW (ref 39.0–52.0)
HCT: 36.3 % — ABNORMAL LOW (ref 39.0–52.0)
HCT: 31.1 % — ABNORMAL LOW (ref 39.0–52.0)
HEMOGLOBIN: 12.3 g/dL — AB (ref 13.0–17.0)
Hemoglobin: 12 g/dL — ABNORMAL LOW (ref 13.0–17.0)
Hemoglobin: 10.5 g/dL — ABNORMAL LOW (ref 13.0–17.0)
MCH: 28.8 pg (ref 26.0–34.0)
MCH: 29.2 pg (ref 26.0–34.0)
MCH: 29.2 pg (ref 26.0–34.0)
MCHC: 32.4 g/dL (ref 30.0–36.0)
MCHC: 33.1 g/dL (ref 30.0–36.0)
MCHC: 33.8 g/dL (ref 30.0–36.0)
MCV: 89 fL (ref 78.0–100.0)
MCV: 88.3 fL (ref 78.0–100.0)
MCV: 86.4 fL (ref 78.0–100.0)
PLATELETS: 91 10*3/uL — AB (ref 150–400)
PLATELETS: 84 10*3/uL — AB (ref 150–400)
Platelets: 81 10*3/uL — ABNORMAL LOW (ref 150–400)
RBC: 3.6 MIL/uL — ABNORMAL LOW (ref 4.22–5.81)
RBC: 4.11 MIL/uL — AB (ref 4.22–5.81)
RBC: 4.27 MIL/uL (ref 4.22–5.81)
RDW: 15 % (ref 11.5–15.5)
RDW: 15.3 % (ref 11.5–15.5)
RDW: 15.3 % (ref 11.5–15.5)
WBC: 19 10*3/uL — ABNORMAL HIGH (ref 4.0–10.5)
WBC: 15.8 10*3/uL — ABNORMAL HIGH (ref 4.0–10.5)
WBC: 13.8 10*3/uL — ABNORMAL HIGH (ref 4.0–10.5)

## 2016-06-06 LAB — POCT I-STAT 3, ART BLOOD GAS (G3+)
ACID-BASE DEFICIT: 4 mmol/L — AB (ref 0.0–2.0)
Acid-base deficit: 5 mmol/L — ABNORMAL HIGH (ref 0.0–2.0)
Bicarbonate: 21.8 mmol/L (ref 20.0–28.0)
Bicarbonate: 21.6 mmol/L (ref 20.0–28.0)
O2 SAT: 98 %
O2 Saturation: 96 %
PH ART: 7.322 — AB (ref 7.350–7.450)
PO2 ART: 113 mmHg — AB (ref 83.0–108.0)
Patient temperature: 35.7
Patient temperature: 36.4
TCO2: 23 mmol/L (ref 0–100)
TCO2: 23 mmol/L (ref 0–100)
pCO2 arterial: 41.6 mmHg (ref 32.0–48.0)
pCO2 arterial: 43.1 mmHg (ref 32.0–48.0)
pH, Arterial: 7.305 — ABNORMAL LOW (ref 7.350–7.450)
pO2, Arterial: 87 mmHg (ref 83.0–108.0)

## 2016-06-06 LAB — POCT I-STAT, CHEM 8
BUN: 77 mg/dL — ABNORMAL HIGH (ref 6–20)
Calcium, Ion: 1.09 mmol/L — ABNORMAL LOW (ref 1.15–1.40)
Chloride: 105 mmol/L (ref 101–111)
Creatinine, Ser: 4.5 mg/dL — ABNORMAL HIGH (ref 0.61–1.24)
Glucose, Bld: 126 mg/dL — ABNORMAL HIGH (ref 65–99)
HCT: 30 % — ABNORMAL LOW (ref 39.0–52.0)
Hemoglobin: 10.2 g/dL — ABNORMAL LOW (ref 13.0–17.0)
Potassium: 3.9 mmol/L (ref 3.5–5.1)
Sodium: 142 mmol/L (ref 135–145)
TCO2: 22 mmol/L (ref 0–100)

## 2016-06-06 LAB — RENAL FUNCTION PANEL
Albumin: 1.9 g/dL — ABNORMAL LOW (ref 3.5–5.0)
Anion gap: 11 (ref 5–15)
BUN: 84 mg/dL — AB (ref 6–20)
CALCIUM: 7.5 mg/dL — AB (ref 8.9–10.3)
CO2: 22 mmol/L (ref 22–32)
CREATININE: 4.35 mg/dL — AB (ref 0.61–1.24)
Chloride: 108 mmol/L (ref 101–111)
GFR calc non Af Amer: 14 mL/min — ABNORMAL LOW (ref >60)
GFR, EST AFRICAN AMERICAN: 16 mL/min — AB (ref >60)
GLUCOSE: 129 mg/dL — AB (ref 65–99)
Phosphorus: 6.4 mg/dL — ABNORMAL HIGH (ref 2.5–4.6)
Potassium: 3.9 mmol/L (ref 3.5–5.1)
SODIUM: 141 mmol/L (ref 135–145)

## 2016-06-06 LAB — POCT I-STAT 4, (NA,K, GLUC, HGB,HCT)
Glucose, Bld: 183 mg/dL — ABNORMAL HIGH (ref 65–99)
HCT: 34 % — ABNORMAL LOW (ref 39.0–52.0)
HEMOGLOBIN: 11.6 g/dL — AB (ref 13.0–17.0)
Potassium: 3.9 mmol/L (ref 3.5–5.1)
SODIUM: 142 mmol/L (ref 135–145)

## 2016-06-06 LAB — PREPARE FRESH FROZEN PLASMA
Unit division: 0
Unit division: 0

## 2016-06-06 LAB — PREPARE CRYOPRECIPITATE
UNIT DIVISION: 0
Unit division: 0

## 2016-06-06 LAB — CULTURE, BLOOD (SINGLE): Culture: NO GROWTH

## 2016-06-06 LAB — PREPARE PLATELET PHERESIS: UNIT DIVISION: 0

## 2016-06-06 LAB — APTT: aPTT: 39 seconds — ABNORMAL HIGH (ref 24–36)

## 2016-06-06 LAB — PLATELET COUNT: Platelets: 72 10*3/uL — ABNORMAL LOW (ref 150–400)

## 2016-06-06 LAB — PROTIME-INR
INR: 1.47
PROTHROMBIN TIME: 18 s — AB (ref 11.4–15.2)

## 2016-06-06 LAB — MAGNESIUM: Magnesium: 2 mg/dL (ref 1.7–2.4)

## 2016-06-06 MED ORDER — ASPIRIN 81 MG PO CHEW
324.0000 mg | CHEWABLE_TABLET | Freq: Every day | ORAL | Status: DC
  Administered 2016-06-06 – 2016-06-09 (×4): 324 mg
  Filled 2016-06-06 (×4): qty 4

## 2016-06-06 MED ORDER — NITROGLYCERIN IN D5W 200-5 MCG/ML-% IV SOLN
0.0000 ug/min | INTRAVENOUS | Status: DC
Start: 2016-06-06 — End: 2016-06-10

## 2016-06-06 MED ORDER — HEPARIN SODIUM (PORCINE) 1000 UNIT/ML DIALYSIS
1000.0000 [IU] | INTRAMUSCULAR | Status: DC | PRN
  Administered 2016-06-12: 2600 [IU] via INTRAVENOUS_CENTRAL
  Filled 2016-06-06 (×2): qty 6
  Filled 2016-06-06: qty 4

## 2016-06-06 MED ORDER — CHLORHEXIDINE GLUCONATE 0.12% ORAL RINSE (MEDLINE KIT)
15.0000 mL | Freq: Two times a day (BID) | OROMUCOSAL | Status: DC
  Administered 2016-06-06 – 2016-06-09 (×7): 15 mL via OROMUCOSAL

## 2016-06-06 MED ORDER — ONDANSETRON HCL 4 MG/2ML IJ SOLN: 4.0000 mg | Freq: Four times a day (QID) | INTRAMUSCULAR | Status: DC | PRN

## 2016-06-06 MED ORDER — ACETAMINOPHEN 160 MG/5ML PO SOLN: 650.0000 mg | Freq: Once | ORAL | Status: AC

## 2016-06-06 MED ORDER — MILRINONE LACTATE IN DEXTROSE 20-5 MG/100ML-% IV SOLN
0.2000 ug/kg/min | INTRAVENOUS | Status: DC
  Administered 2016-06-06 – 2016-06-08 (×4): 0.3 ug/kg/min via INTRAVENOUS
  Filled 2016-06-06 (×4): qty 100

## 2016-06-06 MED ORDER — FAMOTIDINE IN NACL 20-0.9 MG/50ML-% IV SOLN
20.0000 mg | Freq: Two times a day (BID) | INTRAVENOUS | Status: AC
  Administered 2016-06-06 (×2): 20 mg via INTRAVENOUS
  Filled 2016-06-06: qty 50

## 2016-06-06 MED ORDER — PRISMASOL BGK 4/2.5 32-4-2.5 MEQ/L IV SOLN
INTRAVENOUS | Status: DC
  Administered 2016-06-06 – 2016-06-12 (×26): via INTRAVENOUS_CENTRAL
  Filled 2016-06-06 (×39): qty 5000

## 2016-06-06 MED ORDER — METOPROLOL TARTRATE 25 MG/10 ML ORAL SUSPENSION
12.5000 mg | Freq: Two times a day (BID) | ORAL | Status: DC
  Administered 2016-06-06: 12.5 mg
  Filled 2016-06-06: qty 5

## 2016-06-06 MED ORDER — LACTATED RINGERS IV SOLN: INTRAVENOUS | Status: DC

## 2016-06-06 MED ORDER — SODIUM CHLORIDE 0.9% FLUSH: 3.0000 mL | INTRAVENOUS | Status: DC | PRN

## 2016-06-06 MED ORDER — FUROSEMIDE 10 MG/ML IJ SOLN
120.0000 mg | Freq: Three times a day (TID) | INTRAVENOUS | Status: DC
  Administered 2016-06-06 – 2016-06-07 (×4): 120 mg via INTRAVENOUS
  Filled 2016-06-06 (×6): qty 12

## 2016-06-06 MED ORDER — MIDAZOLAM HCL 2 MG/2ML IJ SOLN
2.0000 mg | INTRAMUSCULAR | Status: DC | PRN
  Administered 2016-06-06 – 2016-06-08 (×18): 2 mg via INTRAVENOUS
  Filled 2016-06-06 (×18): qty 2

## 2016-06-06 MED ORDER — BISACODYL 5 MG PO TBEC
10.0000 mg | DELAYED_RELEASE_TABLET | Freq: Every day | ORAL | Status: DC
  Administered 2016-06-10 – 2016-06-23 (×5): 10 mg via ORAL
  Filled 2016-06-06 (×7): qty 2

## 2016-06-06 MED ORDER — DEXMEDETOMIDINE HCL IN NACL 400 MCG/100ML IV SOLN
0.4000 ug/kg/h | INTRAVENOUS | Status: DC
  Administered 2016-06-06: 0.7 ug/kg/h via INTRAVENOUS
  Administered 2016-06-06 (×2): 1.2 ug/kg/h via INTRAVENOUS
  Administered 2016-06-06 (×2): 0.7 ug/kg/h via INTRAVENOUS
  Administered 2016-06-07 (×2): 1.2 ug/kg/h via INTRAVENOUS
  Administered 2016-06-07: 0.3 ug/kg/h via INTRAVENOUS
  Administered 2016-06-07: 1.2 ug/kg/h via INTRAVENOUS
  Administered 2016-06-08 (×2): 0.7 ug/kg/h via INTRAVENOUS
  Administered 2016-06-08: 0.4 ug/kg/h via INTRAVENOUS
  Administered 2016-06-09: 0.9 ug/kg/h via INTRAVENOUS
  Filled 2016-06-06 (×3): qty 100
  Filled 2016-06-06: qty 50
  Filled 2016-06-06: qty 100
  Filled 2016-06-06: qty 50
  Filled 2016-06-06 (×2): qty 100
  Filled 2016-06-06: qty 50
  Filled 2016-06-06 (×3): qty 100
  Filled 2016-06-06: qty 50
  Filled 2016-06-06 (×2): qty 100

## 2016-06-06 MED ORDER — DOCUSATE SODIUM 100 MG PO CAPS
200.0000 mg | ORAL_CAPSULE | Freq: Every day | ORAL | Status: DC
  Administered 2016-06-10 – 2016-06-23 (×6): 200 mg via ORAL
  Filled 2016-06-06 (×7): qty 2

## 2016-06-06 MED ORDER — MORPHINE SULFATE (PF) 2 MG/ML IV SOLN
1.0000 mg | INTRAVENOUS | Status: AC | PRN
Start: 2016-06-06 — End: 2016-06-06
  Administered 2016-06-06 (×8): 2 mg via INTRAVENOUS
  Filled 2016-06-06 (×3): qty 1

## 2016-06-06 MED ORDER — BISACODYL 10 MG RE SUPP
10.0000 mg | Freq: Every day | RECTAL | Status: DC
  Administered 2016-06-06 – 2016-06-08 (×3): 10 mg via RECTAL
  Filled 2016-06-06 (×3): qty 1

## 2016-06-06 MED ORDER — SODIUM CHLORIDE 0.9% FLUSH
10.0000 mL | Freq: Two times a day (BID) | INTRAVENOUS | Status: DC
  Administered 2016-06-06: 20 mL
  Administered 2016-06-07: 10 mL
  Administered 2016-06-07: 20 mL
  Administered 2016-06-08 – 2016-06-21 (×16): 10 mL

## 2016-06-06 MED ORDER — ACETAMINOPHEN 650 MG RE SUPP: 650.0000 mg | Freq: Once | RECTAL | Status: AC

## 2016-06-06 MED ORDER — ALBUMIN HUMAN 5 % IV SOLN
250.0000 mL | INTRAVENOUS | Status: AC | PRN
  Administered 2016-06-06: 250 mL via INTRAVENOUS

## 2016-06-06 MED ORDER — EPINEPHRINE HCL 1 MG/ML IJ SOLN
0.0000 ug/min | INTRAMUSCULAR | Status: DC
  Filled 2016-06-06: qty 4

## 2016-06-06 MED ORDER — NOREPINEPHRINE BITARTRATE 1 MG/ML IV SOLN
0.0000 ug/min | INTRAVENOUS | Status: DC
  Filled 2016-06-06: qty 4

## 2016-06-06 MED ORDER — SODIUM CHLORIDE 0.9 % IV SOLN
INTRAVENOUS | Status: DC
  Administered 2016-06-07 – 2016-06-08 (×3): 20 mL/h via INTRAVENOUS

## 2016-06-06 MED ORDER — INSULIN REGULAR HUMAN 100 UNIT/ML IJ SOLN
INTRAMUSCULAR | Status: DC
  Administered 2016-06-06: 3 [IU]/h via INTRAVENOUS
  Administered 2016-06-07: 1.1 [IU]/h via INTRAVENOUS
  Filled 2016-06-06 (×2): qty 2.5

## 2016-06-06 MED ORDER — SODIUM CHLORIDE 0.9 % IV SOLN
250.0000 mL | INTRAVENOUS | Status: DC
  Administered 2016-06-10 – 2016-06-12 (×2): 250 mL via INTRAVENOUS

## 2016-06-06 MED ORDER — METOCLOPRAMIDE HCL 5 MG/ML IJ SOLN
10.0000 mg | Freq: Four times a day (QID) | INTRAMUSCULAR | Status: AC
  Administered 2016-06-06 (×4): 10 mg via INTRAVENOUS
  Filled 2016-06-06 (×4): qty 2

## 2016-06-06 MED ORDER — CHLORHEXIDINE GLUCONATE 0.12 % MT SOLN
15.0000 mL | OROMUCOSAL | Status: AC
  Administered 2016-06-06: 15 mL via OROMUCOSAL

## 2016-06-06 MED ORDER — PHENYLEPHRINE HCL 10 MG/ML IJ SOLN
0.0000 ug/min | INTRAMUSCULAR | Status: DC
  Filled 2016-06-06 (×2): qty 2

## 2016-06-06 MED ORDER — LACTATED RINGERS IV SOLN: 500.0000 mL | Freq: Once | INTRAVENOUS | Status: DC | PRN

## 2016-06-06 MED ORDER — METOPROLOL TARTRATE 12.5 MG HALF TABLET: 12.5000 mg | ORAL_TABLET | Freq: Two times a day (BID) | ORAL | Status: DC

## 2016-06-06 MED ORDER — ACETAMINOPHEN 500 MG PO TABS
1000.0000 mg | ORAL_TABLET | Freq: Four times a day (QID) | ORAL | Status: AC
  Administered 2016-06-08 – 2016-06-10 (×2): 1000 mg via ORAL
  Filled 2016-06-06 (×3): qty 2

## 2016-06-06 MED ORDER — OXYCODONE HCL 5 MG PO TABS
5.0000 mg | ORAL_TABLET | ORAL | Status: DC | PRN
Start: 2016-06-06 — End: 2016-06-23
  Administered 2016-06-12: 10 mg via ORAL
  Administered 2016-06-13 – 2016-06-14 (×2): 5 mg via ORAL
  Administered 2016-06-15: 10 mg via ORAL
  Administered 2016-06-17 – 2016-06-18 (×3): 5 mg via ORAL
  Administered 2016-06-22 (×2): 10 mg via ORAL
  Administered 2016-06-23 (×2): 5 mg via ORAL
  Filled 2016-06-06 (×2): qty 1
  Filled 2016-06-06: qty 2
  Filled 2016-06-06: qty 1
  Filled 2016-06-06: qty 2
  Filled 2016-06-06 (×2): qty 1
  Filled 2016-06-06: qty 2
  Filled 2016-06-06 (×3): qty 1

## 2016-06-06 MED ORDER — ORAL CARE MOUTH RINSE
15.0000 mL | OROMUCOSAL | Status: DC
  Administered 2016-06-06 – 2016-06-09 (×33): 15 mL via OROMUCOSAL

## 2016-06-06 MED ORDER — POTASSIUM CHLORIDE 10 MEQ/50ML IV SOLN: 10.0000 meq | INTRAVENOUS | Status: DC

## 2016-06-06 MED ORDER — TRAMADOL HCL 50 MG PO TABS
50.0000 mg | ORAL_TABLET | ORAL | Status: DC | PRN
Start: 2016-06-06 — End: 2016-06-23
  Administered 2016-06-14 – 2016-06-15 (×3): 50 mg via ORAL
  Filled 2016-06-06 (×3): qty 1

## 2016-06-06 MED ORDER — PRISMASOL BGK 4/2.5 32-4-2.5 MEQ/L IV SOLN
INTRAVENOUS | Status: DC
Start: 2016-06-06 — End: 2016-06-14
  Administered 2016-06-06 – 2016-06-10 (×5): via INTRAVENOUS_CENTRAL
  Filled 2016-06-06 (×9): qty 5000

## 2016-06-06 MED ORDER — PRISMASOL BGK 4/2.5 32-4-2.5 MEQ/L IV SOLN
INTRAVENOUS | Status: DC
  Administered 2016-06-06 – 2016-06-12 (×13): via INTRAVENOUS_CENTRAL
  Filled 2016-06-06 (×16): qty 5000

## 2016-06-06 MED ORDER — PANTOPRAZOLE SODIUM 40 MG PO TBEC: 40.0000 mg | DELAYED_RELEASE_TABLET | Freq: Every day | ORAL | Status: DC

## 2016-06-06 MED ORDER — MAGNESIUM SULFATE 4 GM/100ML IV SOLN: 4.0000 g | Freq: Once | INTRAVENOUS | Status: DC

## 2016-06-06 MED ORDER — MORPHINE SULFATE (PF) 2 MG/ML IV SOLN
2.0000 mg | INTRAVENOUS | Status: DC | PRN
  Administered 2016-06-06 – 2016-06-08 (×14): 2 mg via INTRAVENOUS
  Administered 2016-06-09: 4 mg via INTRAVENOUS
  Administered 2016-06-09 – 2016-06-12 (×5): 2 mg via INTRAVENOUS
  Filled 2016-06-06: qty 1
  Filled 2016-06-06: qty 2
  Filled 2016-06-06 (×4): qty 1
  Filled 2016-06-06: qty 2
  Filled 2016-06-06 (×18): qty 1

## 2016-06-06 MED ORDER — SODIUM CHLORIDE 0.9 % FOR CRRT
INTRAVENOUS_CENTRAL | Status: DC | PRN
  Filled 2016-06-06: qty 1000

## 2016-06-06 MED ORDER — ASPIRIN EC 325 MG PO TBEC
325.0000 mg | DELAYED_RELEASE_TABLET | Freq: Every day | ORAL | Status: DC
  Administered 2016-06-10 – 2016-06-12 (×3): 325 mg via ORAL
  Filled 2016-06-06 (×4): qty 1

## 2016-06-06 MED ORDER — SODIUM CHLORIDE 0.45 % IV SOLN
INTRAVENOUS | Status: DC | PRN
  Administered 2016-06-06: 01:00:00 via INTRAVENOUS

## 2016-06-06 MED ORDER — DEXTROSE 5 % IV SOLN
1.5000 g | Freq: Two times a day (BID) | INTRAVENOUS | Status: DC
  Administered 2016-06-06 – 2016-06-07 (×3): 1.5 g via INTRAVENOUS
  Filled 2016-06-06 (×4): qty 1.5

## 2016-06-06 MED ORDER — SODIUM CHLORIDE 0.9% FLUSH
3.0000 mL | Freq: Two times a day (BID) | INTRAVENOUS | Status: DC
Start: 2016-06-06 — End: 2016-06-14
  Administered 2016-06-06: 3 mL via INTRAVENOUS
  Administered 2016-06-06: 6 mL via INTRAVENOUS
  Administered 2016-06-07 – 2016-06-12 (×8): 3 mL via INTRAVENOUS

## 2016-06-06 MED ORDER — INSULIN REGULAR BOLUS VIA INFUSION
0.0000 [IU] | Freq: Three times a day (TID) | INTRAVENOUS | Status: DC
  Filled 2016-06-06: qty 10

## 2016-06-06 MED ORDER — ACETAMINOPHEN 160 MG/5ML PO SOLN
1000.0000 mg | Freq: Four times a day (QID) | ORAL | Status: AC
  Administered 2016-06-06 – 2016-06-10 (×13): 1000 mg
  Filled 2016-06-06 (×13): qty 40.6

## 2016-06-06 MED ORDER — METOPROLOL TARTRATE 5 MG/5ML IV SOLN: 2.5000 mg | INTRAVENOUS | Status: DC | PRN

## 2016-06-06 MED ORDER — SODIUM CHLORIDE 0.9% FLUSH
10.0000 mL | INTRAVENOUS | Status: DC | PRN
  Administered 2016-06-16: 10 mL
  Filled 2016-06-06: qty 40

## 2016-06-06 MED ORDER — AMIODARONE HCL IN DEXTROSE 360-4.14 MG/200ML-% IV SOLN
30.0000 mg/h | INTRAVENOUS | Status: DC
  Administered 2016-06-06 – 2016-06-07 (×4): 30 mg/h via INTRAVENOUS
  Administered 2016-06-08: 60 mg/h via INTRAVENOUS
  Administered 2016-06-08: 30 mg/h via INTRAVENOUS
  Administered 2016-06-08: 60 mg/h via INTRAVENOUS
  Administered 2016-06-09 – 2016-06-10 (×4): 30 mg/h via INTRAVENOUS
  Filled 2016-06-06 (×2): qty 200
  Filled 2016-06-06: qty 2800
  Filled 2016-06-06 (×8): qty 200

## 2016-06-06 MED ORDER — VANCOMYCIN HCL IN DEXTROSE 1-5 GM/200ML-% IV SOLN
1000.0000 mg | Freq: Once | INTRAVENOUS | Status: AC
  Administered 2016-06-06: 1000 mg via INTRAVENOUS
  Filled 2016-06-06: qty 200

## 2016-06-06 NOTE — Progress Notes (Signed)
[facility] Rounding Note  Subjective/Interval History:   S/p 3 V CABG UOP dwindling Just had 120 IV lasix to see if diuresis - 25 cc after 1 hour  Objective Vital signs in last 24 hours: Vitals:   06/06/16 1030 06/06/16 1045 06/06/16 1100 06/06/16 1115  BP:   (!) 105/48   Pulse: (!) 44 (!) 44 (!) 45 (!) 44  Resp: (!) _0 Temp: 97.3 F (36.3 C) 97.3 F (36.3 C) 97.3 F (36.3 C) 97.3 F (36.3 C)  TempSrc: Core (Comment) Core (Comment) Core (Comment) Core (Comment)  SpO2: 98% 98% 98% 99%  Weight:      Height:       Weight change: 3.7 kg (8 lb 2.5 oz)  Intake/Output Summary (Last 24 hours) at 06/06/16 1118 Last data filed at 06/06/16 1100  Gross per 24 hour  Intake           6809.9 ml  Output             2509 ml  Net           4300.9 ml   Physical Exam:  Blood pressure (!) 105/48, pulse (!) 44, temperature 97.3 F (36.3 C), temperature source Core (Comment), resp. rate 16, height _1  (1.676 m), weight 83.4 kg (183 lb 13.8 oz), SpO2 99 %.  Hemodynamics: 31/21 CVP 13 PCWP 19 Intubated and sedated. Generalized anasarca Cannot see his neck veins Breath sounds diminshed L>R Regular S1S2 Cannot hear murmur or rub Abd quiet IABP R groin Foley small amount yellow urine (not dilute and just had lasix)   Weight Trending    06/06/16 83.4 kg  06/05/16  79.7 kg   06/04/16  75.6 kg   06/03/16  72.5 kg   06/02/16  72 kg   06/01/16 72.3 kg   05/31/16  73.9 kg   05/31/16  63.5 kg    Labs:  Recent Labs Lab 05/31/16 1824 06/01/16 0439 06/01/16 1650 06/02/16 0426 06/03/16 0500 06/04/16 0453 06/05/16 0344 06/05/16 1833 06/05/16 2012 06/05/16 2029 06/05/16 2110 06/05/16 2145 06/05/16 2251 06/06/16 0025 06/06/16 0500  NA _2 138 138 139 142 143  K 5.8* 4.9 4.1 4.4 4.5 3.6 3.8 3.5 3.7 3.9 4.4 5.1 4.3 3.9 4.4  CL 110 112* _3 103  --  107  CO2 16* 18* 19* 19* 23 24 21*  --   --   --   --    --   --   --  23  GLUCOSE 286* 172* 127* 161* 295* 238* 186* 114* 147* 160* 214* 200* 212* 183* 142*  BUN 76* 80* 82* 81* 85* 89* 94* 100* 94* 93* 88* 87* 85*  --  85*  CREATININE 2.84* 2.66* 2.58* 2.59* 3.18* 3.57* 4.14* 4.20* 4.10* 3.90* 3.70* 4.10* 3.80*  --  4.18*  CALCIUM 8.2* 8.4* 8.1* 8.3* 7.8* 7.5* 7.7*  --   --   --   --   --   --   --  7.4*  PHOS 4.7* 5.1* 5.4* 5.5*  --   --   --   --   --   --   --   --   --   --   --      Recent Labs Lab 06/03/16 0500 06/04/16 0453 06/05/16 0344  AST _4 ALT 36 33 35  ALKPHOS 84 93  112  BILITOT 0.5 0.7 0.5  PROT 4.7* 4.2* 5.0*  ALBUMIN 2.0* 1.6* 1.7*     Recent Labs Lab 06/04/16 0453 06/05/16 0344  06/05/16 2140  06/05/16 2251 06/06/16 0025 06/06/16 0054 06/06/16 0500  WBC 9.4 12.8*  --   --   --   --   --  19.0* 15.8*  HGB 7.2* 8.6*  < > 8.2*  < > 8.8* 11.6* 12.3* 12.0*  HCT 22.5* 26.3*  < > 24.9*  < > 26.0* 34.0* 38.0* 36.3*  MCV 91.8 90.4  --   --   --   --   --  89.0 88.3  PLT 108* 134*  --  72*  --   --   --  81* 91*  < > = values in this interval not displayed.   Recent Labs Lab 05/31/16 1435 05/31/16 1824 05/31/16 2311 06/01/16 0439  TROPONINI 1.43* 1.68* 2.46* 2.90*   CBG:  Recent Labs Lab 06/06/16 0651 06/06/16 0839 06/06/16 0941 06/06/16 1047 06/06/16 1116  GLUCAP 153* 114* 106* 90 92    Studies/Results: Dg Chest Port 1 View  Result Date: 06/06/2016 CLINICAL DATA:  Status post CABG surgery.  Subsequent encounter. EXAM: PORTABLE CHEST 1 VIEW COMPARISON:  06/05/2016 FINDINGS: There is persistent basilar opacity, greater on the right, consistent with atelectasis. No new lung abnormalities. No pulmonary edema. No pneumothorax. No mediastinal widening. Endotracheal tube, right internal jugular Swan-Ganz catheter, intra-aortic balloon pump, right subclavian dual lumen central venous catheter, nasal/orogastric tube, bilateral chest tubes and mediastinal tube are stable and well positioned.  IMPRESSION: 1. No acute findings. No evidence of pulmonary edema, mediastinal widening or of a pneumothorax. 2. Persistent lung base opacity consistent with atelectasis, greater on the right. 3. Support apparatus is stable and well positioned. Electronically Signed   By: Lajean Manes M.D.   On: 06/06/2016 08:15   Dg Chest Portable 1 View  Result Date: 06/06/2016 CLINICAL DATA:  Status post chest tube placement. Assess for pneumothorax. Initial encounter. EXAM: PORTABLE CHEST 1 VIEW COMPARISON:  Chest radiograph performed earlier today at 4:50 a.m. FINDINGS: [name] endotracheal tube is seen ending 6 cm above the carina. A right subclavian line is noted ending about the cavoatrial junction. A right IJ Swan-Ganz catheter is seen ending at the pulmonary outflow tract. A left IJ line is noted ending about the left brachiocephalic vein. Bilateral chest tubes are noted, new from the prior study. No definite pneumothorax is seen. Mild bibasilar atelectasis is noted. Previously noted pleural effusions have resolved. The cardiomediastinal silhouette is borderline enlarged. The patient is status post median sternotomy, with evidence of prior CABG. The intra-aortic balloon pump tip is somewhat more inferior than typically seen, 3 cm below the aortic knob. This could be advanced 1-2 cm, as deemed clinically appropriate. No acute osseous abnormalities are seen. IMPRESSION: 1. Status post placement of bilateral chest tubes. No definite pneumothorax seen. Previously noted pleural effusions have resolved. Mild bibasilar atelectasis noted. 2. Endotracheal tube seen ending 6 cm above the carina. This could be advanced 2 cm, as deemed clinically appropriate. 3. Intra-aortic balloon pump tip is somewhat more inferior than typically seen, 3 cm below the aortic knob. This be advanced 1-2 cm, as deemed clinically appropriate. Electronically Signed   By: Garald Balding M.D.   On: 06/06/2016 00:03   Dg Chest Port 1  View  Result Date: 06/05/2016 CLINICAL DATA:  IABP placement EXAM: PORTABLE CHEST 1 VIEW COMPARISON:  06/04/2016 FINDINGS: Endotracheal tube with tip  measuring 5.2 cm above the carina. Enteric tube tip is off the field of view but below the left hemidiaphragm. Right Swan-Ganz catheter with tip over the pulmonary outflow tract. IABP marker positioned over the upper descending aorta just below the aortic arch. Normal heart size and pulmonary vascularity. Bilateral pleural effusions with bibasilar infiltration or edema. No pneumothorax. Appearances are similar to previous study. IMPRESSION: Appliances appear in satisfactory position. Bilateral pleural effusions with bilateral basilar infiltration or edema. Electronically Signed   By: Lucienne Capers M.D.   On: 06/05/2016 05:44   Medications: . sodium chloride 10 mL/hr at 06/06/16 0100  . sodium chloride    . sodium chloride 20 mL (06/06/16 0030)  . amiodarone 30 mg/hr (06/06/16 1100)  . dexmedetomidine 0.7 mcg/kg/hr (06/06/16 1100)  . EPINEPHrine 4 mg in dextrose 5% 250 mL infusion (16 mcg/mL)    . insulin (NOVOLIN-R) infusion 1.6 Units/hr (06/06/16 1100)  . milrinone 0.25 mcg/kg/min (06/06/16 1100)  . nitroGLYCERIN Stopped (06/06/16 0430)  . norepinephrine (LEVOPHED) Adult infusion Stopped (06/06/16 0645)  . phenylephrine (NEO-SYNEPHRINE) Adult infusion     . acetaminophen  1,000 mg Oral Q6H   Or  . acetaminophen (TYLENOL) oral liquid 160 mg/5 mL  1,000 mg Per Tube Q6H  . aspirin EC  325 mg Oral Daily   Or  . aspirin  324 mg Per Tube Daily  . atorvastatin  80 mg Oral q1800  . bisacodyl  10 mg Oral Daily   Or  . bisacodyl  10 mg Rectal Daily  . cefUROXime (ZINACEF)  IV  1.5 g Intravenous Q12H  . chlorhexidine gluconate (MEDLINE KIT)  15 mL Mouth Rinse BID  . docusate sodium  200 mg Oral Daily  . furosemide  120 mg Intravenous TID PC  . insulin regular  0-10 Units Intravenous TID WC  . magnesium sulfate  4 g Intravenous Once  . mouth  rinse  15 mL Mouth Rinse 10 times per day  . metoCLOPramide (REGLAN) injection  10 mg Intravenous Q6H  . metoprolol tartrate  12.5 mg Oral BID   Or  . metoprolol tartrate  12.5 mg Per Tube BID  . [START ON 06/07/2016] pantoprazole  40 mg Oral Daily  . sodium chloride flush  3 mL Intravenous Q12H     Background: 60 y.o. year-old [place] man with PMH of HTN (on ARB PTA), DM (poorly controlled A1C 11.8 05/15/16), HLD, CKD 4 , history of bilateral DVT. He presented to the ED on 9/17 with several days of SOB, leg and abd swelling. Admission findings included creatinine of 2.69, K 5.8, pH 7.2 with bicarb 16Hb 10.1, elevated troponins->NSTEMI. Intubated for resp failure with pulmonary edema/bilateral effusions. ECHO  LVEF 35-40%, Grade 2 DD. 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 IABP was inserted. AF with RVR.  Pt has CKD4 at baseline, creatinine 2.57-2.71 with ~3 gm proteinuria (05/2016 from [facility]), 2.59 on admission, over 4 days serially rose to 4.14 with a reduction in UOP. Pt for CABG and we were asked to see.  Assessment/Recommendations  1. CAD/NSTEMI/3V CABG - multivessel CAD with systolic CHF. Severe 3v ds by cath. S/p 3VCABG 9/22 2. AKI on CKD4 - creatinine about same as pre-op, UOP falling.  Just had lasix 120 IV.  No dramatic change UOP.  Weight up close to 20 kg. I would favor going ahead with CRRT if OK with all consultants. Neg 50-100/hour. All 4K fluids. No heparin. 3. [place] shows name of [place] nephrologist - office currently closed.  Will get records on Monday. Not sure what education has been done. Presume 2/2 DM (3 gm proteinuria, no [place] available). Clarify workup.  4. Acute resp failure - vent management per CCM. S/p bilat thoracenteses by CCM this adm for transudative pleural effusions. 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 Anemia - transfused 9/21, 9/22. Anemia predated admission (9.1 on 05/15/16). Check iron studies.  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 Check PTH/meds as  appropriate    Jamal Maes, MD [facility] [phone] pager 06/06/2016, 11:18 AM

## 2016-06-06 NOTE — Progress Notes (Signed)
1 Day Post-Op Procedure(s) (LRB): CORONARY ARTERY BYPASS GRAFTING (CABG) x3 LIMA to LAD -SVG to OM -SVG to RCA (N/A) INTRAOPERATIVE TRANSESOPHAGEAL ECHOCARDIOGRAM (N/A) INSERTION OF DIALYSIS/trialysis CATHETER (N/A) Subjective: Sedated on the vent  Milrinone 0.3, IABP 1:2  Amiodarone drip. Paced at 90.   Objective: Vital signs in last 24 hours: Temp:  [95.9 F (35.5 C)-98.4 F (36.9 C)] 97.2 F (36.2 C) (09/23 1315) Pulse Rate:  [43-90] 45 (09/23 1315) Cardiac Rhythm: Atrial paced (09/23 1315) Resp:  [15-33] 18 (09/23 1315) BP: (91-117)/(43-75) 106/52 (09/23 1300) SpO2:  [97 %-100 %] 98 % (09/23 1315) Arterial Line BP: (127-154)/(51-53) 154/53 (09/22 1609) FiO2 (%):  [40 %-50 %] 50 % (09/23 1315) Weight:  [83.4 kg (183 lb 13.8 oz)] 83.4 kg (183 lb 13.8 oz) (09/23 0500)  Hemodynamic parameters for last 24 hours: PAP: (29-47)/(17-26) 32/22 CVP:  [9 mmHg-13 mmHg] 13 mmHg CO:  [4.6 L/min-6.1 L/min] 6 L/min CI:  [2.5 L/min/m2-3.3 L/min/m2] 3.3 L/min/m2  Intake/Output from previous day: 09/22 0701 - 09/23 0700 In: 7157.9 [I.V.:3313.9; EKCMK:3491; NG/GT:150; IV Piggyback:550] Out: 2599 [Urine:655; Blood:1604; Chest Tube:340] Intake/Output this shift: Total I/O In: 640.7 [I.V.:436.7; NG/GT:30; IV Piggyback:174] Out: 185 [Urine:135; Chest Tube:50]  General appearance: sedated Heart: regular rate and rhythm, S1, S2 normal, no murmur, click, rub or gallop Lungs: clear to auscultation bilaterally Abdomen: soft, few BS Extremities: edema moderate. Feet warm and pink. dp doppler signals present. Wound: dressings dry  Lab Results:  Recent Labs  06/06/16 0054 06/06/16 0500  WBC 19.0* 15.8*  HGB 12.3* 12.0*  HCT 38.0* 36.3*  PLT 81* 91*   BMET:  Recent Labs  06/05/16 0344  06/05/16 2251 06/06/16 0025 06/06/16 0500  NA 139  < > 139 142 143  K 3.8  < > 4.3 3.9 4.4  CL 104  < > 103  --  107  CO2 21*  --   --   --  23  GLUCOSE 186*  < > 212* 183* 142*  BUN 94*  < >  85*  --  85*  CREATININE 4.14*  < > 3.80*  --  4.18*  CALCIUM 7.7*  --   --   --  7.4*  < > = values in this interval not displayed.  PT/INR:  Recent Labs  06/06/16 0054  LABPROT 18.0*  INR 1.47   ABG    Component Value Date/Time   PHART 7.305 (L) 06/06/2016 0841   HCO3 21.6 06/06/2016 0841   TCO2 23 06/06/2016 0841   ACIDBASEDEF 5.0 (H) 06/06/2016 0841   O2SAT 96.0 06/06/2016 0841   CBG (last 3)   Recent Labs  06/06/16 1116 06/06/16 1227 06/06/16 1318  GLUCAP 92 84 115*   CXR: ok  ECG: anterolateral ST-T abn  Assessment/Plan: S/P Procedure(s) (LRB): CORONARY ARTERY BYPASS GRAFTING (CABG) x3 LIMA to LAD -SVG to OM -SVG to RCA (N/A) INTRAOPERATIVE TRANSESOPHAGEAL ECHOCARDIOGRAM (N/A) INSERTION OF DIALYSIS/trialysis CATHETER (N/A)  He is hemodynamically stable on milrinone 0.3 with CI 3.3. Will keep IABP in today and plan to remove it in the am.  AKI on CKD4: nephrology has seen him and plans to start CRRT today. He did not respond much to 120 mg IV lasix. Weight is probably at least 20 lbs over baseline, probably more.  Acute respiratory failure preop. Will keep on vent until we can get volume off with CRRT. Question of pneumonia preop with Serratia in sputum on vanc/Zosyn.  Continue chest tubes      LOS: 6 days  Kenneth [name] 06/06/2016

## 2016-06-06 NOTE — Anesthesia Postprocedure Evaluation (Signed)
Anesthesia Post Note  Patient: Kenneth [name]  Procedure(s) Performed: Procedure(s) (LRB): CORONARY ARTERY BYPASS GRAFTING (CABG) x3 LIMA to LAD -SVG to OM -SVG to RCA (N/A) INTRAOPERATIVE TRANSESOPHAGEAL ECHOCARDIOGRAM (N/A) INSERTION OF DIALYSIS/trialysis CATHETER (N/A)  Patient location during evaluation: SICU Anesthesia Type: General Level of consciousness: sedated Pain management: pain level controlled Vital Signs Assessment: post-procedure vital signs reviewed and stable Respiratory status: patient remains intubated per anesthesia plan Cardiovascular status: stable Anesthetic complications: no    Last Vitals:  Vitals:   06/06/16 0200 06/06/16 0215  BP: (!) 96/55   Pulse: (!) 44 (!) 44  Resp: 17 16  Temp: (!) 35.5 C (!) 35.5 C    Last Pain:  Vitals:   06/06/16 0100  TempSrc: Core (Comment)                 Tiajuana Amass

## 2016-06-06 NOTE — Transfer of Care (Signed)
Immediate Anesthesia Transfer of Care Note  Patient: Kenneth [name]  Procedure(s) Performed: Procedure(s): CORONARY ARTERY BYPASS GRAFTING (CABG) x3 LIMA to LAD -SVG to OM -SVG to RCA (N/A) INTRAOPERATIVE TRANSESOPHAGEAL ECHOCARDIOGRAM (N/A) INSERTION OF DIALYSIS/trialysis CATHETER (N/A)  Patient Location: SICU  Anesthesia Type:General  Level of Consciousness: Patient remains intubated per anesthesia plan  Airway & Oxygen Therapy: Patient remains intubated per anesthesia plan and Patient placed on Ventilator (see vital sign flow sheet for setting)  Post-op Assessment: Report given to RN and Post -op Vital signs reviewed and stable  Post vital signs: Reviewed and stable  Last Vitals:  Vitals:   06/05/16 1609 06/05/16 1700  BP:  117/75  Pulse: (!) 59   Resp: (!) 24   Temp: 36.6 C     Last Pain:  Vitals:   06/05/16 1200  TempSrc: Core (Comment)         Complications: No apparent anesthesia complications

## 2016-06-06 NOTE — Progress Notes (Signed)
200cc's of fentanyl wasted down sink. Witnessed by Eleonore Chiquito

## 2016-06-06 NOTE — Progress Notes (Signed)
PULMONARY / CRITICAL CARE MEDICINE   Name: Kenneth [name] MRN: [id number] DOB: Apr 02, 1956    ADMISSION DATE:  05/31/2016 CONSULTATION DATE:  05/31/2016   REFERRING MD:  Dr. Dayna Barker, ER  CHIEF COMPLAINT:  Sob, resp failure  HISTORY OF PRESENT ILLNESS:   60 yo male male presented with dyspnea felt to be related to respiratory infection.  He had progressive orthopnea, chest discomfort, lower extremity edema, and respiratory failure from acute pulmonary edema with acute combined CHF.  PMHx of DM, HTN, HLD, CKD 4  SUBJECTIVE:  CABG x3 yesterday Minimal vent settings Minimal urine output  VITAL SIGNS: BP (!) 103/50   Pulse 89   Temp 97.9 F (36.6 C)   Resp 18   Ht 5\' 6"  (1.676 m)   Wt 83.4 kg (183 lb 13.8 oz)   SpO2 97%   BMI 29.68 kg/m   HEMODYNAMICS: PAP: (30-52)/(15-26) 33/23 CVP:  [8 mmHg-16 mmHg] 13 mmHg CO:  [4.6 L/min-7 L/min] 4.6 L/min CI:  [2.5 L/min/m2-3.9 L/min/m2] 2.5 L/min/m2  VENTILATOR SETTINGS: Vent Mode: SIMV;PSV;PRVC FiO2 (%):  [40 %-50 %] 50 % Set Rate:  [16 bmp-24 bmp] 16 bmp Vt Set:  [510 mL-528 mL] 510 mL PEEP:  [5 cmH20-8 cmH20] 5 cmH20 Plateau Pressure:  [21 cmH20-23 cmH20] 23 cmH20  INTAKE / OUTPUT: I/O last 3 completed shifts: In: 8354.4 [I.V.:4055.4; Blood:3144; NG/GT:480; IV Piggyback:675] Out: 2958 [Urine:1010; Stool:4; Blood:1604; Chest Tube:340]  PHYSICAL EXAMINATION: General:  On vent HENT: NCAT ETT in place PULM: Few crackles, vent supported breaths CV: Balloon pump audible GI: balloon pump, can't really hear breath sounds MSK: normal bulk and tone Neuro: sedated on vent  LABS:  BMET  Recent Labs Lab 06/04/16 0453 06/05/16 0344  06/05/16 2145 06/05/16 2251 06/06/16 0025 06/06/16 0500  NA 138 139  < > 138 139 142 143  K 3.6 3.8  < > 5.1 4.3 3.9 4.4  CL 103 104  < > 104 103  --  107  CO2 24 21*  --   --   --   --  23  BUN 89* 94*  < > 87* 85*  --  85*  CREATININE 3.57* 4.14*  < > 4.10* 3.80*  --  4.18*  GLUCOSE 238*  186*  < > 200* 212* 183* 142*  < > = values in this interval not displayed.  Electrolytes  Recent Labs Lab 06/01/16 0439 06/01/16 1650 06/02/16 0426  06/04/16 0453 06/05/16 0344 06/06/16 0500  CALCIUM 8.4* 8.1* 8.3*  < > 7.5* 7.7* 7.4*  MG 1.9 1.7 1.8  --   --   --  2.0  PHOS 5.1* 5.4* 5.5*  --   --   --   --   < > = values in this interval not displayed.  CBC  Recent Labs Lab 06/05/16 0344  06/05/16 2140  06/06/16 0025 06/06/16 0054 06/06/16 0500  WBC 12.8*  --   --   --   --  19.0* 15.8*  HGB 8.6*  < > 8.2*  < > 11.6* 12.3* 12.0*  HCT 26.3*  < > 24.9*  < > 34.0* 38.0* 36.3*  PLT 134*  --  72*  --   --  81* 91*  < > = values in this interval not displayed.  Coag's  Recent Labs Lab 05/31/16 1435 06/05/16 0344 06/06/16 0054  APTT  --  120* 39*  INR 0.96  --  1.47    Sepsis Markers  Recent Labs Lab 05/31/16 2345 06/01/16  5885  06/02/16 0830 06/03/16 0500 06/04/16 0453  LATICACIDVEN 1.0 0.8  --   --   --   --   PROCALCITON  --   --   < > 0.11 1.06 1.13  < > = values in this interval not displayed.  ABG  Recent Labs Lab 06/05/16 2151 06/05/16 2254 06/06/16 0025  PHART 7.359 7.319* 7.322*  PCO2ART 38.9 39.3 41.6  PO2ART 256.0* 211.0* 113.0*    Liver Enzymes  Recent Labs Lab 06/03/16 0500 06/04/16 0453 06/05/16 0344  AST 18 30 29   ALT 36 33 35  ALKPHOS 84 93 112  BILITOT 0.5 0.7 0.5  ALBUMIN 2.0* 1.6* 1.7*    Cardiac Enzymes  Recent Labs Lab 05/31/16 1824 05/31/16 2311 06/01/16 0439  TROPONINI 1.68* 2.46* 2.90*    Glucose  Recent Labs Lab 06/06/16 0203 06/06/16 0305 06/06/16 0400 06/06/16 0455 06/06/16 0604 06/06/16 0651  GLUCAP 140* 111* 153* 144* 131* 153*    Imaging Dg Chest Portable 1 View  Result Date: 06/06/2016 CLINICAL DATA:  Status post chest tube placement. Assess for pneumothorax. Initial encounter. EXAM: PORTABLE CHEST 1 VIEW COMPARISON:  Chest radiograph performed earlier today at 4:50 a.m. FINDINGS:  [name] endotracheal tube is seen ending 6 cm above the carina. A right subclavian line is noted ending about the cavoatrial junction. A right IJ Swan-Ganz catheter is seen ending at the pulmonary outflow tract. A left IJ line is noted ending about the left brachiocephalic vein. Bilateral chest tubes are noted, new from the prior study. No definite pneumothorax is seen. Mild bibasilar atelectasis is noted. Previously noted pleural effusions have resolved. The cardiomediastinal silhouette is borderline enlarged. The patient is status post median sternotomy, with evidence of prior CABG. The intra-aortic balloon pump tip is somewhat more inferior than typically seen, 3 cm below the aortic knob. This could be advanced 1-2 cm, as deemed clinically appropriate. No acute osseous abnormalities are seen. IMPRESSION: 1. Status post placement of bilateral chest tubes. No definite pneumothorax seen. Previously noted pleural effusions have resolved. Mild bibasilar atelectasis noted. 2. Endotracheal tube seen ending 6 cm above the carina. This could be advanced 2 cm, as deemed clinically appropriate. 3. Intra-aortic balloon pump tip is somewhat more inferior than typically seen, 3 cm below the aortic knob. This be advanced 1-2 cm, as deemed clinically appropriate. Electronically Signed   By: Garald Balding M.D.   On: 06/06/2016 00:03   STUDIES:  Echo 9/17 >> EF 35 to 40%, grade 2 DD [facility] 9/17 >> severe 3 vessel disease Rt thoracentesis 9/19 >> glucose 168, protein < 3, LDH 61, WBC 117  CULTURES: Blood 9/19 >> Sputum 9/19 >> Few Serratia marcescens Rt pleural fluid 9/19 >>   ANTIBIOTICS: Vancomycin 9/19 >> 9/21 Zosyn 9/19 >>  SIGNIFICANT EVENTS: 9/17 from [facility] to [facility] [place], Cardiology consulted, IABP 9/18 TCTS consulted, a fib with RVR >> amiodarone bolus  LINES/TUBES: 9/17 ETT >>  9/17 IABP >> 9/17 Rt IJ Introducer >>  9/22 R subclavian HD cath> 9/22 Chest tube/mediastinal tubes >>   ASSESSMENT /  PLAN:  PULMONARY A: Acute hypoxic respiratory failure 2nd to acute pulmonary edema, pleural effusions, pneumonia >> minimal vent settings  P:   Full vent support, but could wean Would prefer to start volume removal or see him diurese prior to extubation Adjust PEEP/FiO2 to keep SpO2 > 92% F/u CXR, ABG  CARDIOVASCULAR A:  Cardiogenic shock Acute combined CHF 3 vessel CAD> s/p CABG 9/22 A fib  P:  Post op care per TCTS Vasopressors/cardioactive meds per Cardiology/TCTS  RENAL A:   CKD 4 >> baseline creatinine 2.57 from 05/21/16 P:   Consider volume removal with CVVHD today if not diuresing Monitor BMET and UOP Replace electrolytes as needed   GASTROINTESTINAL A:   Nutrition P:   Tube feedings per surgery protocol Protonix for SUP  HEMATOLOGIC A:   Anemia of critical illness, chronic disease P:  Monitor for bleeding CBD in AM  INFECTIOUS A:   Pneumonia with Serratia in sputum cx P:   Day 5 of zosyn, plan 7 days  ENDOCRINE A:   DM P:   Insulin gtt > continue 06/06/2016   NEUROLOGIC A:   Acute metabolic encephalopathy P:   RASS goal: -1 Precedex per surgery protocol  CC time 35 minutes  Roselie Awkward, MD [place] [facility] Pager: [phone] Cell: [phone] After 3pm or if no response, call [phone]

## 2016-06-06 NOTE — Progress Notes (Signed)
Patient ID: Kenneth [name], male   DOB: July 21, 1956, 60 y.o.   MRN: [id number]     Advanced Heart Failure Rounding Note  Referring Physician: Raylene Miyamoto, MD  Primary Physician: Rodena Medin, MD Primary HF: New ( Dr Aundra Dubin)  Reason for Consultation: Acute respiratory failure  Subjective:    Kenneth [name] is a 60 y.o. male with hx of poorly controlled diabetes, HTN, CKD IV, and HLD.  Admitted 05/31/16 with orthopnea, DOE, and CP. EKG did not meet stemi criteria.  Troponin 1.4. Echo showed global LV dysfunction. Started on NIMV for acute respiratory failure, failed requiring intubation.   Echo 05/31/16 LVEF 35-40%, Grade 2 DD  LHC 05/31/16 showed LM lesion, 40 %stenosed, Ramus lesion, 80 %stenosed, Ost Cx to Prox Cx lesion, 80 %stenosed, Mid LAD lesion, 90 %stenosed, Ost 2nd Diag to 2nd Diag lesion, 90 %stenosed, Prox RCA to Mid RCA lesion, 100 %stenosed. IABP inserted.   Atrial fibrillation with RVR developed 9/18, now on amiodarone gtt and in NSR.   Thoracentesis on right on 9/19,  transudative (1000 cc off).   Thoracentesis on left on 9/21, 600 cc off.   Underwent CABG x 3. 9/22  Remains on vent. Sedated. Minimal urine output. Cr 3.8 -> 4.2. Weight up 15 pounds.    Swan numbers this morning CVP 14 PA 36/22 CI 3.3   Objective:   Weight Range: 83.4 kg (183 lb 13.8 oz) Body mass index is 29.68 kg/m.   Vital Signs:   Temp:  [95.9 F (35.5 C)-98.4 F (36.9 C)] 97.5 F (36.4 C) (09/23 0839) Pulse Rate:  [44-90] 45 (09/23 0839) Resp:  [15-33] 16 (09/23 0839) BP: (91-213)/(43-75) 91/48 (09/23 0800) SpO2:  [94 %-100 %] 99 % (09/23 0839) Arterial Line BP: (127-212)/(48-63) 154/53 (09/22 1609) FiO2 (%):  [40 %-50 %] 50 % (09/23 0839) Weight:  [83.4 kg (183 lb 13.8 oz)] 83.4 kg (183 lb 13.8 oz) (09/23 0500) Last BM Date: 06/05/16  Weight change: Filed Weights   06/04/16 0500 06/05/16 0500 06/06/16 0500  Weight: 75.6 kg (166 lb 10.7 oz) 79.7 kg (175 lb 11.3 oz) 83.4 kg  (183 lb 13.8 oz)    Intake/Output:   Intake/Output Summary (Last 24 hours) at 06/06/16 0845 Last data filed at 06/06/16 0749  Gross per 24 hour  Intake          7113.98 ml  Output             2619 ml  Net          4494.98 ml     Physical Exam: CVP 14 General:  Intubated and sedated. Markedly edematous HEENT: ET tube + [place] Neck: supple. JVP elevated.  SwanCarotids 2+ bilat; no bruits. No thyromegaly or nodule noted.  Cor: Sternal dressing Regular rate & rhythm. No rubs, gallops or murmurs. Lungs: Decreased breath sounds on left.   Abdomen: Distended. Quiet  Extremities: no cyanosis, clubbing, rash. 3+edema. R groin IABP stable.  Neuro: alert & orientedx3, cranial nerves grossly intact. moves all 4 extremities w/o difficulty. Affect pleasant  Telemetry: Reviewed, A paced 90 (underlying sinus brady 40s)  Labs: CBC  Recent Labs  06/06/16 0054 06/06/16 0500  WBC 19.0* 15.8*  HGB 12.3* 12.0*  HCT 38.0* 36.3*  MCV 89.0 88.3  PLT 81* 91*   Basic Metabolic Panel  Recent Labs  06/05/16 0344  06/05/16 2251 06/06/16 0025 06/06/16 0500  NA 139  < > 139 142 143  K 3.8  < > 4.3  3.9 4.4  CL 104  < > 103  --  107  CO2 21*  --   --   --  23  GLUCOSE 186*  < > 212* 183* 142*  BUN 94*  < > 85*  --  85*  CREATININE 4.14*  < > 3.80*  --  4.18*  CALCIUM 7.7*  --   --   --  7.4*  MG  --   --   --   --  2.0  < > = values in this interval not displayed. Liver Function Tests  Recent Labs  06/04/16 0453 06/05/16 0344  AST 30 29  ALT 33 35  ALKPHOS 93 112  BILITOT 0.7 0.5  PROT 4.2* 5.0*  ALBUMIN 1.6* 1.7*   No results for input(s): LIPASE, AMYLASE in the last 72 hours. Cardiac Enzymes No results for input(s): CKTOTAL, CKMB, CKMBINDEX, TROPONINI in the last 72 hours.  BNP: BNP (last 3 results)  Recent Labs  05/31/16 1435  BNP 2,537.9*    ProBNP (last 3 results) No results for input(s): PROBNP in the last 8760 hours.   D-Dimer No results for input(s):  DDIMER in the last 72 hours. Hemoglobin A1C  Recent Labs  06/04/16 1630  HGBA1C 10.4*   Fasting Lipid Panel  Recent Labs  06/03/16 1609  TRIG 132   Thyroid Function Tests No results for input(s): TSH, T4TOTAL, T3FREE, THYROIDAB in the last 72 hours.  Invalid input(s): FREET3  Other results:     Imaging/Studies:  Dg Chest Port 1 View  Result Date: 06/06/2016 CLINICAL DATA:  Status post CABG surgery.  Subsequent encounter. EXAM: PORTABLE CHEST 1 VIEW COMPARISON:  06/05/2016 FINDINGS: There is persistent basilar opacity, greater on the right, consistent with atelectasis. No new lung abnormalities. No pulmonary edema. No pneumothorax. No mediastinal widening. Endotracheal tube, right internal jugular Swan-Ganz catheter, intra-aortic balloon pump, right subclavian dual lumen central venous catheter, nasal/orogastric tube, bilateral chest tubes and mediastinal tube are stable and well positioned. IMPRESSION: 1. No acute findings. No evidence of pulmonary edema, mediastinal widening or of a pneumothorax. 2. Persistent lung base opacity consistent with atelectasis, greater on the right. 3. Support apparatus is stable and well positioned. Electronically Signed   By: Lajean Manes M.D.   On: 06/06/2016 08:15   Dg Chest Portable 1 View  Result Date: 06/06/2016 CLINICAL DATA:  Status post chest tube placement. Assess for pneumothorax. Initial encounter. EXAM: PORTABLE CHEST 1 VIEW COMPARISON:  Chest radiograph performed earlier today at 4:50 a.m. FINDINGS: [name] endotracheal tube is seen ending 6 cm above the carina. A right subclavian line is noted ending about the cavoatrial junction. A right IJ Swan-Ganz catheter is seen ending at the pulmonary outflow tract. A left IJ line is noted ending about the left brachiocephalic vein. Bilateral chest tubes are noted, new from the prior study. No definite pneumothorax is seen. Mild bibasilar atelectasis is noted. Previously noted pleural  effusions have resolved. The cardiomediastinal silhouette is borderline enlarged. The patient is status post median sternotomy, with evidence of prior CABG. The intra-aortic balloon pump tip is somewhat more inferior than typically seen, 3 cm below the aortic knob. This could be advanced 1-2 cm, as deemed clinically appropriate. No acute osseous abnormalities are seen. IMPRESSION: 1. Status post placement of bilateral chest tubes. No definite pneumothorax seen. Previously noted pleural effusions have resolved. Mild bibasilar atelectasis noted. 2. Endotracheal tube seen ending 6 cm above the carina. This could be advanced 2 cm, as deemed  clinically appropriate. 3. Intra-aortic balloon pump tip is somewhat more inferior than typically seen, 3 cm below the aortic knob. This be advanced 1-2 cm, as deemed clinically appropriate. Electronically Signed   By: Garald Balding M.D.   On: 06/06/2016 00:03   Dg Chest Port 1 View  Result Date: 06/05/2016 CLINICAL DATA:  IABP placement EXAM: PORTABLE CHEST 1 VIEW COMPARISON:  06/04/2016 FINDINGS: Endotracheal tube with tip measuring 5.2 cm above the carina. Enteric tube tip is off the field of view but below the left hemidiaphragm. Right Swan-Ganz catheter with tip over the pulmonary outflow tract. IABP marker positioned over the upper descending aorta just below the aortic arch. Normal heart size and pulmonary vascularity. Bilateral pleural effusions with bibasilar infiltration or edema. No pneumothorax. Appearances are similar to previous study. IMPRESSION: Appliances appear in satisfactory position. Bilateral pleural effusions with bilateral basilar infiltration or edema. Electronically Signed   By: Lucienne Capers M.D.   On: 06/05/2016 05:44    Latest Echo  Latest Cath   Medications:     Scheduled Medications: . acetaminophen  1,000 mg Oral Q6H   Or  . acetaminophen (TYLENOL) oral liquid 160 mg/5 mL  1,000 mg Per Tube Q6H  . aspirin EC  325 mg Oral Daily    Or  . aspirin  324 mg Per Tube Daily  . atorvastatin  80 mg Oral q1800  . bisacodyl  10 mg Oral Daily   Or  . bisacodyl  10 mg Rectal Daily  . cefUROXime (ZINACEF)  IV  1.5 g Intravenous Q12H  . chlorhexidine gluconate (MEDLINE KIT)  15 mL Mouth Rinse BID  . docusate sodium  200 mg Oral Daily  . famotidine (PEPCID) IV  20 mg Intravenous Q12H  . insulin regular  0-10 Units Intravenous TID WC  . magnesium sulfate  4 g Intravenous Once  . mouth rinse  15 mL Mouth Rinse 10 times per day  . metoCLOPramide (REGLAN) injection  10 mg Intravenous Q6H  . metoprolol tartrate  12.5 mg Oral BID   Or  . metoprolol tartrate  12.5 mg Per Tube BID  . [START ON 06/07/2016] pantoprazole  40 mg Oral Daily  . sodium chloride flush  3 mL Intravenous Q12H    Infusions: . sodium chloride 10 mL/hr at 06/06/16 0100  . sodium chloride    . sodium chloride 20 mL (06/06/16 0030)  . amiodarone 30 mg/hr (06/06/16 0700)  . dexmedetomidine 0.7 mcg/kg/hr (06/06/16 0700)  . EPINEPHrine 4 mg in dextrose 5% 250 mL infusion (16 mcg/mL)    . insulin (NOVOLIN-R) infusion 4.1 Units/hr (06/06/16 0600)  . milrinone 0.25 mcg/kg/min (06/06/16 0700)  . nitroGLYCERIN Stopped (06/06/16 0430)  . norepinephrine (LEVOPHED) Adult infusion Stopped (06/06/16 0645)  . phenylephrine (NEO-SYNEPHRINE) Adult infusion      PRN Medications: sodium chloride, albumin human, Influenza vac split quadrivalent PF, lactated ringers, metoprolol, midazolam, morphine injection, morphine injection, ondansetron (ZOFRAN) IV, oxyCODONE, sodium chloride flush, traMADol   Assessment/Plan   ELDEAN NANNA is a 60 y.o. male with hx of poorly controlled diabetes, HTN, CKD IV, and HLD.  Admitted 05/31/16 with orthopnea, DOE, and CP.  Intubated, sedated, placed on IABP.  S/p CABG x 3 9/22 (LIMA-LAD, SVG-OM, SVG-RCA)  1. Acute on chronic systolic CHF with cardiogenic shock: EF 35-40% with wall motion abnormalities, ischemic cardiomyopathy.  IABP in place  1:2 -s/p CABG x 3 on 9/22 - Hemodynamics much improved. Now on milrinone 0.25 and IABP 1:2. Output is great -  Main issue is marked volume overload in setting of advanced renal failure. Start lasix 120 IV q8. May need CVVHD if he doesn't respond. Renal following - Can leave IABP in for now. Can pull later today or tomorrow. Will d/w CVTS 2. CAD: Admitted with NSTEMI, severe 3VD. S/p 3v CABG 9/22 - ASA, atorvastatin 80.  3. AKI on CKD stage IV.  Creatinine up to 4.2 with sluggish UOP.  Baseline seems to be aroud 2.5 but has been close to 4 past few days. Renal following. Start IV lasix. May need CVVHD.  4. Acute respiratory failure: right thoracentesis and left thoracentesis done.  Lungs more clear. On minimal vent settings. CCM managing 5. Anemia: Right groin IABP site stable. Appropriate increase in hemoglobin with 1 unit transfusion.  6. ID: Afebrile now, On vanco/Zosyn, ?PNA.  Serratia in sputum.     The patient is critically ill with multiple organ systems failure and requires high complexity decision making for assessment and support, frequent evaluation and titration of therapies, application of advanced monitoring technologies and extensive interpretation of multiple databases.   Critical Care Time devoted to patient care services described in this note is 35 Minutes.   Length of Stay: 6  Gradie Butrick MD 06/06/2016, 8:45 AM  Advanced Heart Failure Team Pager [phone] (M-F; [place])  Please contact [facility] for night-coverage after hours (4p -7a ) and weekends on [email or web address]

## 2016-06-06 NOTE — Progress Notes (Signed)
Patient ID: Kenneth [name], male   DOB: Dec 18, 1955, 60 y.o.   MRN: [id number]   SICU Evening Rounds:   Hemodynamically stable  CI = 3.1  Not much response to lasix. On CRRT removing volume. CT output low  CBC    Component Value Date/Time   WBC 13.8 (H) 06/06/2016 1506   RBC 3.60 (L) 06/06/2016 1506   HGB 10.2 (L) 06/06/2016 1515   HCT 30.0 (L) 06/06/2016 1515   PLT 84 (L) 06/06/2016 1506   MCV 86.4 06/06/2016 1506   MCH 29.2 06/06/2016 1506   MCHC 33.8 06/06/2016 1506   RDW 15.3 06/06/2016 1506     BMET    Component Value Date/Time   NA 142 06/06/2016 1515   K 3.9 06/06/2016 1515   CL 105 06/06/2016 1515   CO2 22 06/06/2016 1506   GLUCOSE 126 (H) 06/06/2016 1515   BUN 77 (H) 06/06/2016 1515   CREATININE 4.50 (H) 06/06/2016 1515   CALCIUM 7.5 (L) 06/06/2016 1506   GFRNONAA 14 (L) 06/06/2016 1506   GFRAA 16 (L) 06/06/2016 1506     A/P:  Stable postop course. Continue current plans. Will wean IABP to 1:3 in the morning and remove.

## 2016-06-07 ENCOUNTER — Inpatient Hospital Stay (HOSPITAL_COMMUNITY): Payer: BLUE CROSS/BLUE SHIELD

## 2016-06-07 DIAGNOSIS — Z951 Presence of aortocoronary bypass graft: Secondary | ICD-10-CM

## 2016-06-07 DIAGNOSIS — I5023 Acute on chronic systolic (congestive) heart failure: Secondary | ICD-10-CM

## 2016-06-07 LAB — GLUCOSE, CAPILLARY
Glucose-Capillary: 102 mg/dL — ABNORMAL HIGH (ref 65–99)
Glucose-Capillary: 105 mg/dL — ABNORMAL HIGH (ref 65–99)
Glucose-Capillary: 106 mg/dL — ABNORMAL HIGH (ref 65–99)
Glucose-Capillary: 107 mg/dL — ABNORMAL HIGH (ref 65–99)
Glucose-Capillary: 107 mg/dL — ABNORMAL HIGH (ref 65–99)
Glucose-Capillary: 110 mg/dL — ABNORMAL HIGH (ref 65–99)
Glucose-Capillary: 110 mg/dL — ABNORMAL HIGH (ref 65–99)
Glucose-Capillary: 115 mg/dL — ABNORMAL HIGH (ref 65–99)
Glucose-Capillary: 117 mg/dL — ABNORMAL HIGH (ref 65–99)
Glucose-Capillary: 117 mg/dL — ABNORMAL HIGH (ref 65–99)
Glucose-Capillary: 118 mg/dL — ABNORMAL HIGH (ref 65–99)
Glucose-Capillary: 119 mg/dL — ABNORMAL HIGH (ref 65–99)
Glucose-Capillary: 123 mg/dL — ABNORMAL HIGH (ref 65–99)
Glucose-Capillary: 124 mg/dL — ABNORMAL HIGH (ref 65–99)
Glucose-Capillary: 124 mg/dL — ABNORMAL HIGH (ref 65–99)
Glucose-Capillary: 124 mg/dL — ABNORMAL HIGH (ref 65–99)
Glucose-Capillary: 125 mg/dL — ABNORMAL HIGH (ref 65–99)
Glucose-Capillary: 125 mg/dL — ABNORMAL HIGH (ref 65–99)
Glucose-Capillary: 127 mg/dL — ABNORMAL HIGH (ref 65–99)
Glucose-Capillary: 132 mg/dL — ABNORMAL HIGH (ref 65–99)
Glucose-Capillary: 150 mg/dL — ABNORMAL HIGH (ref 65–99)
Glucose-Capillary: 58 mg/dL — ABNORMAL LOW (ref 65–99)
Glucose-Capillary: 64 mg/dL — ABNORMAL LOW (ref 65–99)
Glucose-Capillary: 81 mg/dL (ref 65–99)
Glucose-Capillary: 90 mg/dL (ref 65–99)

## 2016-06-07 LAB — RENAL FUNCTION PANEL
ALBUMIN: 1.8 g/dL — AB (ref 3.5–5.0)
ALBUMIN: 1.8 g/dL — AB (ref 3.5–5.0)
ANION GAP: 8 (ref 5–15)
Anion gap: 10 (ref 5–15)
BUN: 61 mg/dL — ABNORMAL HIGH (ref 6–20)
BUN: 46 mg/dL — AB (ref 6–20)
CALCIUM: 7.6 mg/dL — AB (ref 8.9–10.3)
CHLORIDE: 106 mmol/L (ref 101–111)
CO2: 25 mmol/L (ref 22–32)
CO2: 23 mmol/L (ref 22–32)
CREATININE: 2.93 mg/dL — AB (ref 0.61–1.24)
Calcium: 7.7 mg/dL — ABNORMAL LOW (ref 8.9–10.3)
Chloride: 108 mmol/L (ref 101–111)
Creatinine, Ser: 3.49 mg/dL — ABNORMAL HIGH (ref 0.61–1.24)
GFR, EST AFRICAN AMERICAN: 21 mL/min — AB (ref >60)
GFR, EST AFRICAN AMERICAN: 26 mL/min — AB (ref >60)
GFR, EST NON AFRICAN AMERICAN: 18 mL/min — AB (ref >60)
GFR, EST NON AFRICAN AMERICAN: 22 mL/min — AB (ref >60)
GLUCOSE: 118 mg/dL — AB (ref 65–99)
Glucose, Bld: 116 mg/dL — ABNORMAL HIGH (ref 65–99)
PHOSPHORUS: 4.6 mg/dL (ref 2.5–4.6)
PHOSPHORUS: 4.2 mg/dL (ref 2.5–4.6)
POTASSIUM: 3.7 mmol/L (ref 3.5–5.1)
POTASSIUM: 3.8 mmol/L (ref 3.5–5.1)
SODIUM: 141 mmol/L (ref 135–145)
Sodium: 139 mmol/L (ref 135–145)

## 2016-06-07 LAB — CBC
HEMATOCRIT: 31.9 % — AB (ref 39.0–52.0)
HEMOGLOBIN: 10.4 g/dL — AB (ref 13.0–17.0)
MCH: 28.8 pg (ref 26.0–34.0)
MCHC: 32.6 g/dL (ref 30.0–36.0)
MCV: 88.4 fL (ref 78.0–100.0)
PLATELETS: 80 10*3/uL — AB (ref 150–400)
RBC: 3.61 MIL/uL — AB (ref 4.22–5.81)
RDW: 15.2 % (ref 11.5–15.5)
WBC: 12.6 10*3/uL — AB (ref 4.0–10.5)

## 2016-06-07 LAB — CULTURE, BODY FLUID-BOTTLE: CULTURE: NO GROWTH

## 2016-06-07 LAB — POCT I-STAT 3, ART BLOOD GAS (G3+)
Acid-base deficit: 2 mmol/L (ref 0.0–2.0)
Bicarbonate: 23.9 mmol/L (ref 20.0–28.0)
O2 Saturation: 99 %
Patient temperature: 36.1
TCO2: 25 mmol/L (ref 0–100)
pCO2 arterial: 41 mmHg (ref 32.0–48.0)
pH, Arterial: 7.369 (ref 7.350–7.450)
pO2, Arterial: 128 mmHg — ABNORMAL HIGH (ref 83.0–108.0)

## 2016-06-07 LAB — IRON AND TIBC
IRON: 20 ug/dL — AB (ref 45–182)
Saturation Ratios: 14 % — ABNORMAL LOW (ref 17.9–39.5)
TIBC: 147 ug/dL — ABNORMAL LOW (ref 250–450)
UIBC: 127 ug/dL

## 2016-06-07 LAB — MAGNESIUM: Magnesium: 2.1 mg/dL (ref 1.7–2.4)

## 2016-06-07 LAB — CULTURE, BODY FLUID W GRAM STAIN -BOTTLE

## 2016-06-07 LAB — POCT ACTIVATED CLOTTING TIME: Activated Clotting Time: 147 seconds

## 2016-06-07 MED ORDER — DEXTROSE 5 % IV SOLN
2.0000 g | INTRAVENOUS | Status: AC
  Administered 2016-06-07 – 2016-06-09 (×3): 2 g via INTRAVENOUS
  Filled 2016-06-07 (×3): qty 2

## 2016-06-07 MED ORDER — FENTANYL CITRATE (PF) 100 MCG/2ML IJ SOLN
INTRAMUSCULAR | Status: AC
  Filled 2016-06-07: qty 2

## 2016-06-07 MED ORDER — FENTANYL CITRATE (PF) 100 MCG/2ML IJ SOLN
100.0000 ug | Freq: Once | INTRAMUSCULAR | Status: AC
  Administered 2016-06-07: 100 ug via INTRAVENOUS

## 2016-06-07 MED ORDER — AMIODARONE LOAD VIA INFUSION
150.0000 mg | Freq: Once | INTRAVENOUS | Status: AC
  Administered 2016-06-07: 150 mg via INTRAVENOUS

## 2016-06-07 MED ORDER — DEXTROSE 5 % IV SOLN: 2.0000 g | INTRAVENOUS | Status: DC

## 2016-06-07 MED ORDER — LIDOCAINE HCL (PF) 1 % IJ SOLN
INTRAMUSCULAR | Status: AC
Start: 2016-06-07 — End: 2016-06-07
  Filled 2016-06-07: qty 30

## 2016-06-07 MED ORDER — INSULIN ASPART 100 UNIT/ML ~~LOC~~ SOLN
0.0000 [IU] | SUBCUTANEOUS | Status: DC
  Administered 2016-06-08 (×2): 3 [IU] via SUBCUTANEOUS
  Administered 2016-06-08 – 2016-06-10 (×9): 2 [IU] via SUBCUTANEOUS
  Administered 2016-06-11 (×2): 3 [IU] via SUBCUTANEOUS
  Administered 2016-06-11: 2 [IU] via SUBCUTANEOUS
  Administered 2016-06-11: 5 [IU] via SUBCUTANEOUS
  Administered 2016-06-11 – 2016-06-12 (×2): 2 [IU] via SUBCUTANEOUS
  Administered 2016-06-12: 5 [IU] via SUBCUTANEOUS
  Administered 2016-06-12: 3 [IU] via SUBCUTANEOUS
  Administered 2016-06-12: 5 [IU] via SUBCUTANEOUS
  Administered 2016-06-12: 2 [IU] via SUBCUTANEOUS
  Administered 2016-06-13: 3 [IU] via SUBCUTANEOUS
  Administered 2016-06-13 – 2016-06-14 (×2): 2 [IU] via SUBCUTANEOUS
  Administered 2016-06-14: 3 [IU] via SUBCUTANEOUS
  Administered 2016-06-14 – 2016-06-15 (×3): 2 [IU] via SUBCUTANEOUS
  Administered 2016-06-16: 3 [IU] via SUBCUTANEOUS

## 2016-06-07 MED ORDER — INSULIN DETEMIR 100 UNIT/ML ~~LOC~~ SOLN
10.0000 [IU] | Freq: Two times a day (BID) | SUBCUTANEOUS | Status: DC
  Administered 2016-06-07 – 2016-06-09 (×4): 10 [IU] via SUBCUTANEOUS
  Filled 2016-06-07 (×5): qty 0.1

## 2016-06-07 MED ORDER — PANTOPRAZOLE SODIUM 40 MG PO PACK
40.0000 mg | PACK | Freq: Every day | ORAL | Status: DC
  Administered 2016-06-07 – 2016-06-09 (×3): 40 mg
  Filled 2016-06-07 (×3): qty 20

## 2016-06-07 MED ORDER — DEXTROSE 5 % IV SOLN
2.0000 g | INTRAVENOUS | Status: DC
  Filled 2016-06-07: qty 2

## 2016-06-07 NOTE — Progress Notes (Signed)
[facility]   Called emergently to bedside  Weaned off precedex, was tolerating pressure support, then went into Afib with RVR in midst of severe agitation, did not desaturate Tube was felt to be out possibly so RT advanced.    On exam Lungs with a few rhonchi bilaterally Agitated  Impression: Respiratory failure improved> could likely be extubated but need to control HR/mental status Agitation  Plan: Wean precedex down but not off and repeat SBT. Could extubate on precedex then stop precedex afterwards CXR now to confirm tube placement  Additional CC time 20 minutes  Roselie Awkward, MD [place] [facility] Pager: [phone] Cell: [phone] After 3pm or if no response, call [phone]

## 2016-06-07 NOTE — Progress Notes (Signed)
PULMONARY / CRITICAL CARE MEDICINE   Name: Kenneth [name] MRN: [id number] DOB: 02/03/1956    ADMISSION DATE:  05/31/2016 CONSULTATION DATE:  05/31/2016   REFERRING MD:  Dr. Dayna Barker, ER  CHIEF COMPLAINT:  Sob, resp failure  HISTORY OF PRESENT ILLNESS:   60 yo male male presented with dyspnea felt to be related to respiratory infection.  He had progressive orthopnea, chest discomfort, lower extremity edema, and respiratory failure from acute pulmonary edema with acute combined CHF.  PMHx of DM, HTN, HLD, CKD 4  SUBJECTIVE:  CRRT started, net negative Zosyn fell off  VITAL SIGNS: BP (!) 91/57   Pulse (P) 90   Temp 97.9 F (36.6 C)   Resp 18   Ht 5\' 6"  (1.676 m)   Wt 80.1 kg (176 lb 9.4 oz)   SpO2 99%   BMI 28.50 kg/m   HEMODYNAMICS: PAP: (22-39)/(15-24) 27/20 CO:  [5 L/min-6 L/min] 6 L/min CI:  [2.8 L/min/m2-3.3 L/min/m2] 3.3 L/min/m2  VENTILATOR SETTINGS: Vent Mode: SIMV;PSV;PRVC FiO2 (%):  [50 %] (P) 50 % Set Rate:  [16 bmp] 16 bmp Vt Set:  [510 mL] 510 mL PEEP:  [5 cmH20] 5 cmH20 Pressure Support:  [10 cmH20] 10 cmH20 Plateau Pressure:  [9 cmH20-20 cmH20] 20 cmH20  INTAKE / OUTPUT: I/O last 3 completed shifts: In: 8017.8 [I.V.:4192.8; Blood:2809; NG/GT:130; IV Piggyback:886] Out: 1157 [Urine:850; Emesis/NG output:425; Other:3003; WIOMB:5597; Chest Tube:1160]  PHYSICAL EXAMINATION: General:  On vent HENT: NCAT ETT in place PULM: Few crackles, vent supported breaths CV: Balloon pump audible GI: balloon pump, can't really hear breath sounds MSK: normal bulk and tone Neuro: sedated on vent  LABS:  BMET  Recent Labs Lab 06/06/16 0500 06/06/16 1506 06/06/16 1515 06/07/16 0412  NA 143 141 142 141  K 4.4 3.9 3.9 3.7  CL 107 108 105 108  CO2 23 22  --  25  BUN 85* 84* 77* 61*  CREATININE 4.18* 4.35* 4.50* 3.49*  GLUCOSE 142* 129* 126* 118*    Electrolytes  Recent Labs Lab 06/02/16 0426  06/06/16 0500 06/06/16 1506 06/07/16 0412  CALCIUM 8.3*   < > 7.4* 7.5* 7.6*  MG 1.8  --  2.0  --  2.1  PHOS 5.5*  --   --  6.4* 4.6  < > = values in this interval not displayed.  CBC  Recent Labs Lab 06/06/16 0500 06/06/16 1506 06/06/16 1515 06/07/16 0412  WBC 15.8* 13.8*  --  12.6*  HGB 12.0* 10.5* 10.2* 10.4*  HCT 36.3* 31.1* 30.0* 31.9*  PLT 91* 84*  --  80*    Coag's  Recent Labs Lab 05/31/16 1435 06/05/16 0344 06/06/16 0054  APTT  --  120* 39*  INR 0.96  --  1.47    Sepsis Markers  Recent Labs Lab 05/31/16 2345 06/01/16 0952  06/02/16 0830 06/03/16 0500 06/04/16 0453  LATICACIDVEN 1.0 0.8  --   --   --   --   PROCALCITON  --   --   < > 0.11 1.06 1.13  < > = values in this interval not displayed.  ABG  Recent Labs Lab 06/06/16 0025 06/06/16 0841 06/07/16 0404  PHART 7.322* 7.305* 7.369  PCO2ART 41.6 43.1 41.0  PO2ART 113.0* 87.0 128.0*    Liver Enzymes  Recent Labs Lab 06/03/16 0500 06/04/16 0453 06/05/16 0344 06/06/16 1506 06/07/16 0412  AST 18 30 29   --   --   ALT 36 33 35  --   --   ALKPHOS  84 93 112  --   --   BILITOT 0.5 0.7 0.5  --   --   ALBUMIN 2.0* 1.6* 1.7* 1.9* 1.8*    Cardiac Enzymes  Recent Labs Lab 05/31/16 1824 05/31/16 2311 06/01/16 0439  TROPONINI 1.68* 2.46* 2.90*    Glucose  Recent Labs Lab 06/07/16 0152 06/07/16 0257 06/07/16 0353 06/07/16 0439 06/07/16 0547 06/07/16 0650  GLUCAP 124* 127* 117* 107* 102* 110*    Imaging No results found. STUDIES:  Echo 9/17 >> EF 35 to 40%, grade 2 DD [facility] 9/17 >> severe 3 vessel disease Rt thoracentesis 9/19 >> glucose 168, protein < 3, LDH 61, WBC 117  CULTURES: Blood 9/19 >> Sputum 9/19 >> Few Serratia marcescens Rt pleural fluid 9/19 >>   ANTIBIOTICS: Vancomycin 9/19 >> 9/22 Cefuroxime 9/22 > 9/24 Zosyn 9/19 >>9/22 Ceftriaxone 9/24 >   SIGNIFICANT EVENTS: 9/17 from [facility] to [facility], [place], Cardiology consulted, IABP 9/18 TCTS consulted, a fib with RVR >> amiodarone bolus 9/22 CABG  LINES/TUBES: 9/17  ETT >>  9/17 IABP >> 9/17 Rt IJ Introducer >>   9/22 R subclavian HD cath> 9/22 Chest tube/mediastinal tubes >>   ASSESSMENT / PLAN:  PULMONARY A: Acute hypoxic respiratory failure 2nd to acute pulmonary edema, pleural effusions, pneumonia >> minimal vent settings  P:   Full vent support for now, wean after IABP removal Continue volume removal Adjust PEEP/FiO2 to keep SpO2 > 92% F/u CXR, ABG  CARDIOVASCULAR A:  Cardiogenic shock Acute combined CHF 3 vessel CAD> s/p CABG 9/22 A fib  P:  Post op care per TCTS Vasopressors/cardioactive meds per Cardiology/TCTS  RENAL A:   CKD 4 >> baseline creatinine 2.57 from 05/21/16 P:   Continue CRRT per renal Monitor BMET and UOP Replace electrolytes as needed   GASTROINTESTINAL A:   Nutrition P:   Tube feedings per surgery protocol Protonix for SUP  HEMATOLOGIC A:   Anemia of critical illness, chronic disease P:  Monitor for bleeding CBD in AM  INFECTIOUS A:   Pneumonia with Serratia in sputum cx P:   Day 6 of antibiotic treatment Change to ceftriaxone to complete 7 day course   ENDOCRINE A:   DM P:   Insulin gtt > continue 06/07/2016   NEUROLOGIC A:   Acute metabolic encephalopathy P:   RASS goal: -1 Precedex per surgery protocol  CC time 34 minutes  Roselie Awkward, MD [place] [facility] Pager: [phone] Cell: [phone] After 3pm or if no response, call [phone]

## 2016-06-07 NOTE — Progress Notes (Signed)
2 Days Post-Op Procedure(s) (LRB): CORONARY ARTERY BYPASS GRAFTING (CABG) x3 LIMA to LAD -SVG to OM -SVG to RCA (N/A) INTRAOPERATIVE TRANSESOPHAGEAL ECHOCARDIOGRAM (N/A) INSERTION OF DIALYSIS/trialysis CATHETER (N/A) Subjective:  Intubated and sedated on Precedex. Weaning vent.  Hemodynamics looks good on milrinone 0.3 with CI 3.2.  IABP removed this am.  Objective: Vital signs in last 24 hours: Temp:  [94.8 F (34.9 C)-98.1 F (36.7 C)] 97.2 F (36.2 C) (09/24 1215) Pulse Rate:  [30-91] 89 (09/24 1215) Cardiac Rhythm: Atrial paced (09/24 1215) Resp:  [4-33] 10 (09/24 1215) BP: (89-113)/(47-65) 111/65 (09/24 1200) SpO2:  [97 %-100 %] 99 % (09/24 1215) FiO2 (%):  [50 %] 50 % (09/24 1215) Weight:  [80.1 kg (176 lb 9.4 oz)] 80.1 kg (176 lb 9.4 oz) (09/24 0500)  Hemodynamic parameters for last 24 hours: PAP: (20-34)/(13-24) 23/15 CO:  [5 L/min-6 L/min] 5.8 L/min CI:  [2.8 L/min/m2-3.3 L/min/m2] 3.2 L/min/m2  Intake/Output from previous day: 09/23 0701 - 09/24 0700 In: 2222.2 [I.V.:1786.2; NG/GT:100; IV Piggyback:336] Out: 3888 [Urine:515; Emesis/NG output:425; Chest Tube:820] Intake/Output this shift: Total I/O In: 580.6 [I.V.:408.6; NG/GT:60; IV Piggyback:112] Out: 1286 [Urine:130; KCMKL:4917; Chest Tube:70]  General appearance: edema improved Heart: regular rate and rhythm, S1, S2 normal, no murmur, click, rub or gallop Lungs: clear to auscultation bilaterally Extremities: edema moderate anasarca Wound: dressings dry  Lab Results:  Recent Labs  06/06/16 1506 06/06/16 1515 06/07/16 0412  WBC 13.8*  --  12.6*  HGB 10.5* 10.2* 10.4*  HCT 31.1* 30.0* 31.9*  PLT 84*  --  80*   BMET:  Recent Labs  06/06/16 1506 06/06/16 1515 06/07/16 0412  NA 141 142 141  K 3.9 3.9 3.7  CL 108 105 108  CO2 22  --  25  GLUCOSE 129* 126* 118*  BUN 84* 77* 61*  CREATININE 4.35* 4.50* 3.49*  CALCIUM 7.5*  --  7.6*    PT/INR:  Recent Labs  06/06/16 0054  LABPROT 18.0*   INR 1.47   ABG    Component Value Date/Time   PHART 7.369 06/07/2016 0404   HCO3 23.9 06/07/2016 0404   TCO2 25 06/07/2016 0404   ACIDBASEDEF 2.0 06/07/2016 0404   O2SAT 99.0 06/07/2016 0404   CBG (last 3)   Recent Labs  06/07/16 1133 06/07/16 1136 06/07/16 1228  GLUCAP 58* 107* 118*   CLINICAL DATA:  Status post CABG. Acute respiratory failure with hypoxemia.  EXAM: PORTABLE CHEST 1 VIEW  COMPARISON:  06/06/2016.  FINDINGS: Endotracheal tube in satisfactory position. Right jugular Swan-Ganz catheter tip in the main pulmonary artery segment. Intra aortic balloon pump tip in the proximal descending thoracic aorta. Mediastinal and bilateral chest tubes remain in place without pneumothorax. Stable right subclavian catheter and left jugular catheter. Stable post CABG changes.  The cardiac silhouette remains mildly enlarged. Increased dense left lower lobe opacity. Mildly decreased right basilar atelectasis. No acute bony abnormality.  IMPRESSION: 1. Progressive dense left lower lobe atelectasis or pneumonia. 2. Mildly improved right basilar atelectasis.   Electronically Signed   By: Claudie Revering M.D.   On: 06/07/2016 09:36  Assessment/Plan: S/P Procedure(s) (LRB): CORONARY ARTERY BYPASS GRAFTING (CABG) x3 LIMA to LAD -SVG to OM -SVG to RCA (N/A) INTRAOPERATIVE TRANSESOPHAGEAL ECHOCARDIOGRAM (N/A) INSERTION OF DIALYSIS/trialysis CATHETER (N/A)  He is hemodynamically stable on milrinone with good CO. Rhythm is a-paced. Will continue milrinone for now.  Volume excess much improved with CRRT and I think he can be extubated since he is weaning well.  AKI on  CKD4: He is tolerating CRRT with -100 cc per hr volume removal. Still making some urine. Nephrology is following.   Serratia in sputum preop on Rocephin. CXR looks ok. CCM following.   LOS: 7 days    Gaye Pollack 06/07/2016

## 2016-06-07 NOTE — Progress Notes (Signed)
Patient ID: Kenneth [name], male   DOB: 08-19-1956, 60 y.o.   MRN: [id number]     Advanced Heart Failure Rounding Note  Referring Physician: Raylene Miyamoto, MD  Primary Physician: Rodena Medin, MD Primary HF: New ( Dr Aundra Dubin)  Reason for Consultation: Acute respiratory failure  Subjective:    Kenneth [name] is a 60 y.o. male with hx of poorly controlled diabetes, HTN, CKD IV, and HLD.  Admitted 05/31/16 with orthopnea, DOE, and CP. EKG did not meet stemi criteria.  Troponin 1.4. Echo showed global LV dysfunction. Started on NIMV for acute respiratory failure, failed requiring intubation.   Echo 05/31/16 LVEF 35-40%, Grade 2 DD  LHC 05/31/16 showed LM lesion, 40 %stenosed, Ramus lesion, 80 %stenosed, Ost Cx to Prox Cx lesion, 80 %stenosed, Mid LAD lesion, 90 %stenosed, Ost 2nd Diag to 2nd Diag lesion, 90 %stenosed, Prox RCA to Mid RCA lesion, 100 %stenosed. IABP inserted.   Atrial fibrillation with RVR developed 9/18, now on amiodarone gtt and in NSR.   Thoracentesis on right on 9/19,  transudative (1000 cc off).   Thoracentesis on left on 9/21, 600 cc off.   Underwent CABG x 3. 9/22  Remains on vent. Sedated. Minimal urine output despite 120 IV lasix yesterday. Started on CVVHD. Weight down 7 pounds overnight. IABP at 1:2. Very agitated overnight and had to be restrained and sedated   Swan numbers this morning CVP 10 PA 24/16 CI 3.4   Objective:   Weight Range: 80.1 kg (176 lb 9.4 oz) Body mass index is 28.5 kg/m.   Vital Signs:   Temp:  [94.8 F (34.9 C)-98.1 F (36.7 C)] 98.1 F (36.7 C) (09/24 0815) Pulse Rate:  [41-91] 89 (09/24 0815) Resp:  [11-33] 15 (09/24 0815) BP: (89-113)/(47-59) 103/59 (09/24 0800) SpO2:  [98 %-100 %] 99 % (09/24 0815) FiO2 (%):  [50 %] 50 % (09/24 0815) Weight:  [80.1 kg (176 lb 9.4 oz)] 80.1 kg (176 lb 9.4 oz) (09/24 0500) Last BM Date: 06/05/16  Weight change: Filed Weights   06/05/16 0500 06/06/16 0500 06/07/16 0500  Weight: 79.7 kg  (175 lb 11.3 oz) 83.4 kg (183 lb 13.8 oz) 80.1 kg (176 lb 9.4 oz)    Intake/Output:   Intake/Output Summary (Last 24 hours) at 06/07/16 0830 Last data filed at 06/07/16 0800  Gross per 24 hour  Intake          2211.42 ml  Output             4920 ml  Net         -2708.58 ml     Physical Exam: CVP 10 General:  Intubated and sedated. Markedly edematous HEENT: ET tube + [place] Neck: supple. JVP elevated.  Swan RIJ Carotids 2+ bilat; Cor: Sternal dressing Regular rate & rhythm. No rubs, gallops or murmurs. Lungs: Decreased breath sounds on left.   Abdomen: Distended. Quiet  Extremities: no cyanosis, clubbing, rash. 3+edema. R groin IABP stable.  Neuro: alert & orientedx3, cranial nerves grossly intact. moves all 4 extremities w/o difficulty. Affect pleasant  Telemetry: Reviewed, A paced 90 (underlying sinus brady 40s)  Labs: CBC  Recent Labs  06/06/16 1506 06/06/16 1515 06/07/16 0412  WBC 13.8*  --  12.6*  HGB 10.5* 10.2* 10.4*  HCT 31.1* 30.0* 31.9*  MCV 86.4  --  88.4  PLT 84*  --  80*   Basic Metabolic Panel  Recent Labs  06/06/16 0500 06/06/16 1506 06/06/16 1515 06/07/16 0412  NA  143 141 142 141  K 4.4 3.9 3.9 3.7  CL 107 108 105 108  CO2 23 22  --  25  GLUCOSE 142* 129* 126* 118*  BUN 85* 84* 77* 61*  CREATININE 4.18* 4.35* 4.50* 3.49*  CALCIUM 7.4* 7.5*  --  7.6*  MG 2.0  --   --  2.1  PHOS  --  6.4*  --  4.6   Liver Function Tests  Recent Labs  06/05/16 0344 06/06/16 1506 06/07/16 0412  AST 29  --   --   ALT 35  --   --   ALKPHOS 112  --   --   BILITOT 0.5  --   --   PROT 5.0*  --   --   ALBUMIN 1.7* 1.9* 1.8*   No results for input(s): LIPASE, AMYLASE in the last 72 hours. Cardiac Enzymes No results for input(s): CKTOTAL, CKMB, CKMBINDEX, TROPONINI in the last 72 hours.  BNP: BNP (last 3 results)  Recent Labs  05/31/16 1435  BNP 2,537.9*    ProBNP (last 3 results) No results for input(s): PROBNP in the last 8760  hours.   D-Dimer No results for input(s): DDIMER in the last 72 hours. Hemoglobin A1C  Recent Labs  06/04/16 1630  HGBA1C 10.4*   Fasting Lipid Panel No results for input(s): CHOL, HDL, LDLCALC, TRIG, CHOLHDL, LDLDIRECT in the last 72 hours. Thyroid Function Tests No results for input(s): TSH, T4TOTAL, T3FREE, THYROIDAB in the last 72 hours.  Invalid input(s): FREET3  Other results:     Imaging/Studies:  Dg Chest Port 1 View  Result Date: 06/06/2016 CLINICAL DATA:  Status post CABG surgery.  Subsequent encounter. EXAM: PORTABLE CHEST 1 VIEW COMPARISON:  06/05/2016 FINDINGS: There is persistent basilar opacity, greater on the right, consistent with atelectasis. No new lung abnormalities. No pulmonary edema. No pneumothorax. No mediastinal widening. Endotracheal tube, right internal jugular Swan-Ganz catheter, intra-aortic balloon pump, right subclavian dual lumen central venous catheter, nasal/orogastric tube, bilateral chest tubes and mediastinal tube are stable and well positioned. IMPRESSION: 1. No acute findings. No evidence of pulmonary edema, mediastinal widening or of a pneumothorax. 2. Persistent lung base opacity consistent with atelectasis, greater on the right. 3. Support apparatus is stable and well positioned. Electronically Signed   By: Lajean Manes M.D.   On: 06/06/2016 08:15   Dg Chest Portable 1 View  Result Date: 06/06/2016 CLINICAL DATA:  Status post chest tube placement. Assess for pneumothorax. Initial encounter. EXAM: PORTABLE CHEST 1 VIEW COMPARISON:  Chest radiograph performed earlier today at 4:50 a.m. FINDINGS: [name] endotracheal tube is seen ending 6 cm above the carina. A right subclavian line is noted ending about the cavoatrial junction. A right IJ Swan-Ganz catheter is seen ending at the pulmonary outflow tract. A left IJ line is noted ending about the left brachiocephalic vein. Bilateral chest tubes are noted, new from the prior study. No  definite pneumothorax is seen. Mild bibasilar atelectasis is noted. Previously noted pleural effusions have resolved. The cardiomediastinal silhouette is borderline enlarged. The patient is status post median sternotomy, with evidence of prior CABG. The intra-aortic balloon pump tip is somewhat more inferior than typically seen, 3 cm below the aortic knob. This could be advanced 1-2 cm, as deemed clinically appropriate. No acute osseous abnormalities are seen. IMPRESSION: 1. Status post placement of bilateral chest tubes. No definite pneumothorax seen. Previously noted pleural effusions have resolved. Mild bibasilar atelectasis noted. 2. Endotracheal tube seen ending 6 cm above the  carina. This could be advanced 2 cm, as deemed clinically appropriate. 3. Intra-aortic balloon pump tip is somewhat more inferior than typically seen, 3 cm below the aortic knob. This be advanced 1-2 cm, as deemed clinically appropriate. Electronically Signed   By: Garald Balding M.D.   On: 06/06/2016 00:03    Latest Echo  Latest Cath   Medications:     Scheduled Medications: . acetaminophen  1,000 mg Oral Q6H   Or  . acetaminophen (TYLENOL) oral liquid 160 mg/5 mL  1,000 mg Per Tube Q6H  . aspirin EC  325 mg Oral Daily   Or  . aspirin  324 mg Per Tube Daily  . atorvastatin  80 mg Oral q1800  . bisacodyl  10 mg Oral Daily   Or  . bisacodyl  10 mg Rectal Daily  . cefUROXime (ZINACEF)  IV  1.5 g Intravenous Q12H  . chlorhexidine gluconate (MEDLINE KIT)  15 mL Mouth Rinse BID  . docusate sodium  200 mg Oral Daily  . furosemide  120 mg Intravenous TID PC  . insulin regular  0-10 Units Intravenous TID WC  . magnesium sulfate  4 g Intravenous Once  . mouth rinse  15 mL Mouth Rinse 10 times per day  . pantoprazole  40 mg Oral Daily  . sodium chloride flush  10-40 mL Intracatheter Q12H  . sodium chloride flush  3 mL Intravenous Q12H    Infusions: . sodium chloride 10 mL/hr at 06/06/16 0100  . sodium chloride     . sodium chloride 20 mL/hr at 06/07/16 0700  . amiodarone 30 mg/hr (06/07/16 0800)  . dexmedetomidine 1.2 mcg/kg/hr (06/07/16 0800)  . insulin (NOVOLIN-R) infusion 1.5 Units/hr (06/07/16 0800)  . milrinone 0.3 mcg/kg/min (06/07/16 0800)  . nitroGLYCERIN Stopped (06/06/16 0430)  . phenylephrine (NEO-SYNEPHRINE) Adult infusion    . dialysis replacement fluid (prismasate) 400 mL/hr at 06/07/16 0200  . dialysis replacement fluid (prismasate) 200 mL/hr at 06/06/16 1332  . dialysate (PRISMASATE) 1,000 mL/hr at 06/07/16 0437    PRN Medications: sodium chloride, heparin, Influenza vac split quadrivalent PF, lactated ringers, midazolam, morphine injection, ondansetron (ZOFRAN) IV, oxyCODONE, sodium chloride, sodium chloride flush, sodium chloride flush, traMADol   Assessment/Plan   Kenneth [name] is a 60 y.o. male with hx of poorly controlled diabetes, HTN, CKD IV, and HLD.  Admitted 05/31/16 with orthopnea, DOE, and CP.  Intubated, sedated, placed on IABP.  S/p CABG x 3 9/22 (LIMA-LAD, SVG-OM, SVG-RCA)  1. Acute on chronic systolic CHF with cardiogenic shock: EF 35-40% with wall motion abnormalities, ischemic cardiomyopathy.  IABP in place 1:2 -s/p CABG x 3 on 9/22 - Hemodynamics much improved. Now on milrinone 0.30 and IABP 1:2. Output is great - Main issue is marked volume overload in setting of advanced renal failure. Now on CVVHD - Will pull IABP today - Likely can get Kenneth [name] out soon and follow co-ox and CCVP with central line or Cordis 2. CAD: Admitted with NSTEMI, severe 3VD. S/p 3v CABG 9/22 - ASA, atorvastatin 80.  3. AKI on CKD stage IV.  Creatinine up to 4.2 yesterday with sluggish UOP on high-dose lasix.  Baseline seems to be aroud 2.5 but has been close to 4 past few days. --CVVHD started 9/23.Weight down 7 pounds. Still at least 10-15 pounds to go.  --Renal following  4. Acute respiratory failure: right thoracentesis and left thoracentesis done.  Lungs more clear. On minimal vent  settings. CCM managing 5. Anemia: stable 6. ID: Afebrile now.  Serratia in sputum. CCM narrowed to ceftriaxone. Today day 6/7 of antibiotics.   The patient is critically ill with multiple organ systems failure and requires high complexity decision making for assessment and support, frequent evaluation and titration of therapies, application of advanced monitoring technologies and extensive interpretation of multiple databases.   Critical Care Time devoted to patient care services described in this note is 35 Minutes.   Length of Stay: 7  Bensimhon, Daniel MD 06/07/2016, 8:30 AM  Advanced Heart Failure Team Pager [phone] (M-F; [place])  Please contact [facility] for night-coverage after hours (4p -7a ) and weekends on [email or web address]

## 2016-06-07 NOTE — Progress Notes (Signed)
[facility] Rounding Note  Subjective/Interval History:   POD 2 S/p 3 V CABG CRRT started 9/23 400/200/1000/all 4K/no heparin/neg 100 per hour  Tolerating fluid removal Off pressors Balloon pump to come out today In 4 point restraints  Objective Vital signs in last 24 hours: Vitals:   06/07/16 0815 06/07/16 0830 06/07/16 0845 06/07/16 0900  BP:    (!) 104/59  Pulse: 89 89 89 89  Resp: _0 Temp: 98.1 F (36.7 C) 98.1 F (36.7 C) 98.1 F (36.7 C) 97.9 F (36.6 C)  TempSrc: Core (Comment) Core (Comment) Core (Comment) Core (Comment)  SpO2: 99% 100% 100% 100%  Weight:      Height:       Physical Exam:  Blood pressure (!) 104/59, pulse 89, temperature 97.9 F (36.6 C), temperature source Core (Comment), resp. rate 14, height _1  (1.676 m), weight 80.1 kg (176 lb 9.4 oz), SpO2 100 %.  Hemodynamics: 25/17 CVP 13 PCWP 19 Intubated and sedated. OTT, OGT, Donell Sievert, HD cath R subclavian (9/22) Generalized anasarca but perhaps reduced some Mediastinal tubes Breath sounds diminshed L>R Regular S1S2  Paced Abd quiet IABP R groin/leg immobilized LLE incisions just above and below knee with dry dressings   Weight Trending 06/07/16 0500  80.1 kg      06/06/16 0500  83.4 kg   CRRT started   06/05/16 0500  79.7 kg      06/04/16 0500  75.6 kg      06/03/16 0300  72.5 kg      06/02/16 0400  72 kg      06/01/16 0400  72.3 kg      05/31/16 2100  73.9 kg      05/31/16 1445  63.5 kg        Labs:   Recent Labs  06/06/16 0500 06/06/16 1506 06/06/16 1515 06/07/16 0412  NA 143 141 142 141  K 4.4 3.9 3.9 3.7  CL 107 108 105 108  CO2 23 22  --  25  GLUCOSE 142* 129* 126* 118*  BUN 85* 84* 77* 61*  CREATININE 4.18* 4.35* 4.50* 3.49*  CALCIUM 7.4* 7.5*  --  7.6*  MG 2.0  --   --  2.1  PHOS  --  6.4*  --  4.6     Recent Labs  06/05/16 0344 06/06/16 1506 06/07/16 0412  AST 29  --   --   ALT 35  --   --   ALKPHOS 112  --   --   BILITOT 0.5  --   --   PROT  5.0*  --   --   ALBUMIN 1.7* 1.9* 1.8*     Recent Labs  06/06/16 1506 06/06/16 1515 06/07/16 0412  WBC 13.8*  --  12.6*  HGB 10.5* 10.2* 10.4*  HCT 31.1* 30.0* 31.9*  MCV 86.4  --  88.4  PLT 84*  --  80*     Recent Labs Lab 06/07/16 0439 06/07/16 0547 06/07/16 0650 06/07/16 0754 06/07/16 0919  GLUCAP 107* 102* 110* 106* 81   ABG    Component Value Date/Time   PHART 7.369 06/07/2016 0404   PCO2ART 41.0 06/07/2016 0404   PO2ART 128.0 (H) 06/07/2016 0404   HCO3 23.9 06/07/2016 0404   TCO2 25 06/07/2016 0404   ACIDBASEDEF 2.0 06/07/2016 0404   O2SAT 99.0 06/07/2016 0404     Studies/Results: Dg Chest Port 1 View  Result Date: 06/06/2016 CLINICAL DATA:  Status post CABG  surgery.  Subsequent encounter. EXAM: PORTABLE CHEST 1 VIEW COMPARISON:  06/05/2016 FINDINGS: There is persistent basilar opacity, greater on the right, consistent with atelectasis. No new lung abnormalities. No pulmonary edema. No pneumothorax. No mediastinal widening. Endotracheal tube, right internal jugular Swan-Ganz catheter, intra-aortic balloon pump, right subclavian dual lumen central venous catheter, nasal/orogastric tube, bilateral chest tubes and mediastinal tube are stable and well positioned. IMPRESSION: 1. No acute findings. No evidence of pulmonary edema, mediastinal widening or of a pneumothorax. 2. Persistent lung base opacity consistent with atelectasis, greater on the right. 3. Support apparatus is stable and well positioned. Electronically Signed   By: Lajean Manes M.D.   On: 06/06/2016 08:15   Dg Chest Portable 1 View  Result Date: 06/06/2016 CLINICAL DATA:  Status post chest tube placement. Assess for pneumothorax. Initial encounter. EXAM: PORTABLE CHEST 1 VIEW COMPARISON:  Chest radiograph performed earlier today at 4:50 a.m. FINDINGS: [name] endotracheal tube is seen ending 6 cm above the carina. A right subclavian line is noted ending about the cavoatrial junction. A right IJ  Swan-Ganz catheter is seen ending at the pulmonary outflow tract. A left IJ line is noted ending about the left brachiocephalic vein. Bilateral chest tubes are noted, new from the prior study. No definite pneumothorax is seen. Mild bibasilar atelectasis is noted. Previously noted pleural effusions have resolved. The cardiomediastinal silhouette is borderline enlarged. The patient is status post median sternotomy, with evidence of prior CABG. The intra-aortic balloon pump tip is somewhat more inferior than typically seen, 3 cm below the aortic knob. This could be advanced 1-2 cm, as deemed clinically appropriate. No acute osseous abnormalities are seen. IMPRESSION: 1. Status post placement of bilateral chest tubes. No definite pneumothorax seen. Previously noted pleural effusions have resolved. Mild bibasilar atelectasis noted. 2. Endotracheal tube seen ending 6 cm above the carina. This could be advanced 2 cm, as deemed clinically appropriate. 3. Intra-aortic balloon pump tip is somewhat more inferior than typically seen, 3 cm below the aortic knob. This be advanced 1-2 cm, as deemed clinically appropriate. Electronically Signed   By: Garald Balding M.D.   On: 06/06/2016 00:03   Medications:  Infusions . sodium chloride 10 mL/hr at 06/06/16 0100  . sodium chloride    . sodium chloride 20 mL/hr at 06/07/16 0700  . amiodarone 30 mg/hr (06/07/16 0900)  . dexmedetomidine 1.2 mcg/kg/hr (06/07/16 0900)  . insulin (NOVOLIN-R) infusion 1.5 Units/hr (06/07/16 0800)  . milrinone 0.3 mcg/kg/min (06/07/16 0900)  . nitroGLYCERIN Stopped (06/06/16 0430)  . phenylephrine (NEO-SYNEPHRINE) Adult infusion    . dialysis replacement fluid (prismasate) 400 mL/hr at 06/07/16 0200  . dialysis replacement fluid (prismasate) 200 mL/hr at 06/06/16 1332  . dialysate (PRISMASATE) 1,000 mL/hr at 06/07/16 0437   Scheduled . acetaminophen  1,000 mg Oral Q6H   Or  . acetaminophen (TYLENOL) oral liquid 160 mg/5 mL  1,000 mg Per  Tube Q6H  . aspirin EC  325 mg Oral Daily   Or  . aspirin  324 mg Per Tube Daily  . atorvastatin  80 mg Oral q1800  . bisacodyl  10 mg Oral Daily   Or  . bisacodyl  10 mg Rectal Daily  . cefTRIAXone (ROCEPHIN)  IV  2 g Intravenous Q24H  . chlorhexidine gluconate (MEDLINE KIT)  15 mL Mouth Rinse BID  . docusate sodium  200 mg Oral Daily  . furosemide  120 mg Intravenous TID PC  . insulin regular  0-10 Units Intravenous TID WC  .  magnesium sulfate  4 g Intravenous Once  . mouth rinse  15 mL Mouth Rinse 10 times per day  . pantoprazole  40 mg Oral Daily  . sodium chloride flush  10-40 mL Intracatheter Q12H  . sodium chloride flush  3 mL Intravenous Q12H     Background: 60 y.o. year-old [place] man with PMH of HTN (on ARB PTA), DM (poorly controlled A1C 11.8 05/15/16), HLD, CKD 4.  Presented to ED on 9/17 with pulmonary edema/NSTEMI.  ECHO  LVEF 35-40%, Grade 2 DD. 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  CKD4 at baseline (creatinine 2.57-2.71 with ~3 gm proteinuria 05/2016), 2.59 on admission, over 4 days serially rose to 4.14 with a reduction in UOP. Pt had 3V CABG 06/05/16.  CRRT started 06/06/16 for oliguric AKI on CKD4  Assessment/Recommendations  1. CAD/NSTEMI/3V CABG - multivessel CAD with systolic CHF.  1. Severe 3v ds by cath.  2. S/p 3VCABG 9/22 3. Pressors off - now just amio and milrinone and balloon pump to come out today  2. AKI on CKD4 1. CRRT started 06/06/16 (400/200/1000/all4K/no heparin/neg 100/hour) - tolerating well so far, no filter or access problems (R SCV cath placed 9/22) and current effluent dose 22 ml/kg/hour looks appropriate - continue same 2. K and phos both fine so far 3. Continue with net neg 100/hour (may be able to increase but will see how does with balloon pump out first (to come out this AM) 4. Stop IV lasix  3. [place] shows name of [place] nephrologist -Dr. Gerarda Gunther, [address], [phone]) - unclear if he has seen pt or if had new pt  appt.   1. Office currently closed. Get records on Monday. Not sure what education has been done. Presume 2/2 DM (3 gm proteinuria, no [place] available).  2. Clarify remainder of workup.   4. Acute resp failure  1. Vent management per CCM.  2. S/p bilat thoracenteses by CCM this adm for transudative pleural effusions. 3. 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 Anemia 1. Transfused 9/21, 9/22 and with cabg 2. Anemia predated admission (9.1 on 05/15/16).  3. Checking  iron studies. (ordered) 4. Aranesp if Fe replete  7. CKD-MBD  1. PTH status not known.  2. Checking  PTH (ordered)    Jamal Maes, MD [facility] [phone] pager 06/07/2016, 9:26 AM

## 2016-06-07 NOTE — Progress Notes (Signed)
Pharmacy Antibiotic Note  Kenneth [name] is a 60 y.o. male admitted on 05/31/2016 with pneumonia.  Pharmacy has been consulted for ceftriaxone dosing. Kenneth [name] is currently on CRRT, has elevated wbc trending down, is afebrile, and has a tracheal aspirate culture postiive for serratia sensitive to ceftriaxone. The recommend treatment options for serratia include third and fourth generation cephalosporins, therefore cefuroxime for surgical prophylaxis has been discontinued and ceftriaxone initiated.  Plan: The dose of ceftriaxone will be initiated at 2 grams IV every 24 hours based on renal function and CRRT Continue to monitor CBC, renal function, and s/sx's of infection as necessary    Height: 5\' 6"  (167.6 cm) Weight: 176 lb 9.4 oz (80.1 kg) IBW/kg (Calculated) : 63.8  Temp (24hrs), Avg:96.6 F (35.9 C), Min:94.8 F (34.9 C), Max:98.1 F (36.7 C)   Recent Labs Lab 05/31/16 2345  06/01/16 0952  06/05/16 0344  06/05/16 2251 06/06/16 0054 06/06/16 0500 06/06/16 1506 06/06/16 1515 06/07/16 0412  WBC  --   < >  --   < > 12.8*  --   --  19.0* 15.8* 13.8*  --  12.6*  CREATININE  --   < >  --   < > 4.14*  < > 3.80*  --  4.18* 4.35* 4.50* 3.49*  LATICACIDVEN 1.0  --  0.8  --   --   --   --   --   --   --   --   --   < > = values in this interval not displayed.  Estimated Creatinine Clearance: 22.9 mL/min (by C-G formula based on SCr of 3.49 mg/dL (H)).    Allergies  Allergen Reactions  . No Known Allergies     Antimicrobials this admission: Vanc 9/19 >> 9/21 Zosyn 9/19 >> 9/23 Zinacef post-op x 48h >> 9/24 Ceftriaxone 9/24 >>   Dose adjustments this admission: 9/22 Zosyn EI to q12  Microbiology results: 9/17 MRSA PCR: negative 9/18 BCx: no growth final 9/18 Staph surgical PCR: positive for MRSA 9/19 Tracheal Aspirate:serratia - res to ancef, sensitive to other ceph 9/19 Thoracentesis: no growth final  Thank you for allowing pharmacy to be a part of this patient's  care.  Belia Heman, PharmD PGY1 Pharmacy Resident [phone] (Pager) 06/07/2016 8:37 AM

## 2016-06-07 NOTE — Progress Notes (Signed)
Pt had been weaning on CP/PS and tolerating well. Precedex was gradually weaned off to have patient awake and calm. Precedex had been off for an hr and pt was calm, following commands, and still continuing to wean well. With 7mins left of a wean on 5/10 pt suddenly increased resp rate to 60, converted in to afib 100-110s, and then became agitated. Per resp therapy pt was not receiving volumes and suspected pt had tongued ETT out. CCM MD called. Instructed to calm pt back down. Precedx was restarted at 0.91mcg, 2mg  versed given and then ccm ordered 179mcg fent to be given. Per CCM ok to attempt to wean and extubate again once pt is calm. This time attempt to extubate on precedex if needed to keep pt calm. Will cont to monitor and assess.

## 2016-06-07 NOTE — Progress Notes (Signed)
Removal of IABP from right femoral artery with complication. Manual compression was applied for 40 minutes. Hr: 90, BP: 120 / 73, SP02: 100%. Patient is intubated and was sedated prior to removal. Leg immobilizer was appied to right leg. Pulse are palpable. Sterile 4x4 gauze applied to insertion site with Tegaderm to secure. Upon departure, dressing is dry and intact with no oozing or hematoma present. There is a very small area of bruising at the insertion site.

## 2016-06-07 NOTE — Progress Notes (Signed)
Patient ID: Kenneth [name], male   DOB: 12-Nov-1955, 60 y.o.   MRN: [id number]   SICU Evening Rounds:   Hemodynamically stable  CI = 3.4 on milrinone  Weaned on vent today but then towards the end got tachypneic and went into rapid a-fib.    Urine output ok CRRT continues  CT output low  CBC    Component Value Date/Time   WBC 12.6 (H) 06/07/2016 0412   RBC 3.61 (L) 06/07/2016 0412   HGB 10.4 (L) 06/07/2016 0412   HCT 31.9 (L) 06/07/2016 0412   PLT 80 (L) 06/07/2016 0412   MCV 88.4 06/07/2016 0412   MCH 28.8 06/07/2016 0412   MCHC 32.6 06/07/2016 0412   RDW 15.2 06/07/2016 0412     BMET    Component Value Date/Time   NA 139 06/07/2016 1630   K 3.8 06/07/2016 1630   CL 106 06/07/2016 1630   CO2 23 06/07/2016 1630   GLUCOSE 116 (H) 06/07/2016 1630   BUN 46 (H) 06/07/2016 1630   CREATININE 2.93 (H) 06/07/2016 1630   CALCIUM 7.7 (L) 06/07/2016 1630   GFRNONAA 22 (L) 06/07/2016 1630   GFRAA 26 (L) 06/07/2016 1630     A/P:   Continue current plans. Continue to remove volume and hopefully extubate in the next day or so.

## 2016-06-08 ENCOUNTER — Inpatient Hospital Stay (HOSPITAL_COMMUNITY): Payer: BLUE CROSS/BLUE SHIELD

## 2016-06-08 ENCOUNTER — Encounter (HOSPITAL_COMMUNITY): Payer: BLUE CROSS/BLUE SHIELD

## 2016-06-08 ENCOUNTER — Encounter (HOSPITAL_COMMUNITY): Payer: Self-pay | Admitting: *Deleted

## 2016-06-08 DIAGNOSIS — N179 Acute kidney failure, unspecified: Secondary | ICD-10-CM

## 2016-06-08 DIAGNOSIS — J189 Pneumonia, unspecified organism: Secondary | ICD-10-CM

## 2016-06-08 LAB — TYPE AND SCREEN
ABO/RH(D): A POS
ANTIBODY SCREEN: NEGATIVE
UNIT DIVISION: 0
UNIT DIVISION: 0
UNIT DIVISION: 0
UNIT DIVISION: 0
UNIT DIVISION: 0
UNIT DIVISION: 0
UNIT DIVISION: 0
Unit division: 0
Unit division: 0
Unit division: 0

## 2016-06-08 LAB — POCT I-STAT 3, ART BLOOD GAS (G3+)
Acid-Base Excess: 2 mmol/L (ref 0.0–2.0)
Bicarbonate: 26.1 mmol/L (ref 20.0–28.0)
O2 Saturation: 97 %
Patient temperature: 35.9
TCO2: 27 mmol/L (ref 0–100)
pCO2 arterial: 37.5 mmHg (ref 32.0–48.0)
pH, Arterial: 7.445 (ref 7.350–7.450)
pO2, Arterial: 83 mmHg (ref 83.0–108.0)

## 2016-06-08 LAB — RENAL FUNCTION PANEL
ALBUMIN: 1.8 g/dL — AB (ref 3.5–5.0)
ALBUMIN: 1.8 g/dL — AB (ref 3.5–5.0)
ANION GAP: 8 (ref 5–15)
Anion gap: 5 (ref 5–15)
BUN: 37 mg/dL — ABNORMAL HIGH (ref 6–20)
BUN: 31 mg/dL — AB (ref 6–20)
CALCIUM: 7.5 mg/dL — AB (ref 8.9–10.3)
CALCIUM: 7.8 mg/dL — AB (ref 8.9–10.3)
CHLORIDE: 106 mmol/L (ref 101–111)
CO2: 24 mmol/L (ref 22–32)
CO2: 28 mmol/L (ref 22–32)
CREATININE: 2.37 mg/dL — AB (ref 0.61–1.24)
Chloride: 106 mmol/L (ref 101–111)
Creatinine, Ser: 2.6 mg/dL — ABNORMAL HIGH (ref 0.61–1.24)
GFR calc Af Amer: 33 mL/min — ABNORMAL LOW (ref >60)
GFR, EST AFRICAN AMERICAN: 30 mL/min — AB (ref >60)
GFR, EST NON AFRICAN AMERICAN: 26 mL/min — AB (ref >60)
GFR, EST NON AFRICAN AMERICAN: 29 mL/min — AB (ref >60)
GLUCOSE: 144 mg/dL — AB (ref 65–99)
Glucose, Bld: 159 mg/dL — ABNORMAL HIGH (ref 65–99)
PHOSPHORUS: 3.8 mg/dL (ref 2.5–4.6)
Phosphorus: 3.2 mg/dL (ref 2.5–4.6)
Potassium: 3.7 mmol/L (ref 3.5–5.1)
Potassium: 3.9 mmol/L (ref 3.5–5.1)
SODIUM: 138 mmol/L (ref 135–145)
SODIUM: 139 mmol/L (ref 135–145)

## 2016-06-08 LAB — GLUCOSE, CAPILLARY
Glucose-Capillary: 134 mg/dL — ABNORMAL HIGH (ref 65–99)
Glucose-Capillary: 122 mg/dL — ABNORMAL HIGH (ref 65–99)
Glucose-Capillary: 193 mg/dL — ABNORMAL HIGH (ref 65–99)
Glucose-Capillary: 156 mg/dL — ABNORMAL HIGH (ref 65–99)
Glucose-Capillary: 143 mg/dL — ABNORMAL HIGH (ref 65–99)

## 2016-06-08 LAB — CBC
HEMATOCRIT: 33.5 % — AB (ref 39.0–52.0)
HEMOGLOBIN: 10.9 g/dL — AB (ref 13.0–17.0)
MCH: 28.9 pg (ref 26.0–34.0)
MCHC: 32.5 g/dL (ref 30.0–36.0)
MCV: 88.9 fL (ref 78.0–100.0)
Platelets: 88 10*3/uL — ABNORMAL LOW (ref 150–400)
RBC: 3.77 MIL/uL — AB (ref 4.22–5.81)
RDW: 14.9 % (ref 11.5–15.5)
WBC: 14.6 10*3/uL — AB (ref 4.0–10.5)

## 2016-06-08 LAB — CARBOXYHEMOGLOBIN
Carboxyhemoglobin: 0.8 % (ref 0.5–1.5)
METHEMOGLOBIN: 1.1 % (ref 0.0–1.5)
O2 SAT: 70.5 %
TOTAL HEMOGLOBIN: 11.6 g/dL — AB (ref 12.0–16.0)

## 2016-06-08 LAB — MAGNESIUM: MAGNESIUM: 2.4 mg/dL (ref 1.7–2.4)

## 2016-06-08 LAB — PLATELET INHIBITION P2Y12: Platelet Function  P2Y12: 330 [PRU] (ref 194–418)

## 2016-06-08 MED ORDER — ISOSORBIDE DINITRATE 5 MG PO TABS
5.0000 mg | ORAL_TABLET | Freq: Three times a day (TID) | ORAL | Status: DC
  Administered 2016-06-08 – 2016-06-10 (×5): 5 mg via ORAL
  Filled 2016-06-08 (×7): qty 1

## 2016-06-08 MED ORDER — NEPRO/CARBSTEADY PO LIQD
1000.0000 mL | ORAL | Status: DC
  Administered 2016-06-08: 1000 mL via ORAL
  Filled 2016-06-08: qty 1000

## 2016-06-08 MED ORDER — LABETALOL HCL 5 MG/ML IV SOLN
10.0000 mg | INTRAVENOUS | Status: DC | PRN
  Administered 2016-06-12: 10 mg via INTRAVENOUS
  Filled 2016-06-08: qty 4

## 2016-06-08 MED ORDER — SODIUM CHLORIDE 0.9 % IV SOLN
510.0000 mg | INTRAVENOUS | Status: AC
  Administered 2016-06-08 – 2016-06-15 (×2): 510 mg via INTRAVENOUS
  Filled 2016-06-08 (×4): qty 17

## 2016-06-08 MED ORDER — AMIODARONE IV BOLUS ONLY 150 MG/100ML
150.0000 mg | Freq: Once | INTRAVENOUS | Status: AC
  Administered 2016-06-08: 150 mg via INTRAVENOUS

## 2016-06-08 MED ORDER — MILRINONE LACTATE IN DEXTROSE 20-5 MG/100ML-% IV SOLN
0.1000 ug/kg/min | INTRAVENOUS | Status: DC
  Administered 2016-06-09 (×2): 0.2 ug/kg/min via INTRAVENOUS
  Filled 2016-06-08 (×3): qty 100

## 2016-06-08 MED ORDER — NEPRO/CARBSTEADY PO LIQD
237.0000 mL | ORAL | Status: DC
  Filled 2016-06-08 (×2): qty 237

## 2016-06-08 MED ORDER — HYDRALAZINE HCL 25 MG PO TABS
12.5000 mg | ORAL_TABLET | Freq: Three times a day (TID) | ORAL | Status: DC
  Administered 2016-06-08 – 2016-06-09 (×3): 12.5 mg via ORAL
  Filled 2016-06-08 (×3): qty 1

## 2016-06-08 MED FILL — Mannitol IV Soln 20%: INTRAVENOUS | Qty: 500 | Status: AC

## 2016-06-08 MED FILL — Calcium Chloride Inj 10%: INTRAVENOUS | Qty: 10 | Status: AC

## 2016-06-08 MED FILL — Lidocaine HCl IV Inj 20 MG/ML: INTRAVENOUS | Qty: 5 | Status: AC

## 2016-06-08 MED FILL — Electrolyte-R (PH 7.4) Solution: INTRAVENOUS | Qty: 3000 | Status: AC

## 2016-06-08 MED FILL — Heparin Sodium (Porcine) Inj 1000 Unit/ML: INTRAMUSCULAR | Qty: 10 | Status: AC

## 2016-06-08 MED FILL — Sodium Bicarbonate IV Soln 8.4%: INTRAVENOUS | Qty: 150 | Status: AC

## 2016-06-08 MED FILL — Sodium Chloride IV Soln 0.9%: INTRAVENOUS | Qty: 4000 | Status: AC

## 2016-06-08 NOTE — Progress Notes (Signed)
Subjective: Interval History: has no complaint, sleepy, opens eyes, but not coop.  Objective: Vital signs in last 24 hours: Temp:  [95.9 F (35.5 C)-98.4 F (36.9 C)] 96.4 F (35.8 C) (09/25 0600) Pulse Rate:  [30-115] 80 (09/25 0600) Resp:  [0-31] 22 (09/25 0600) BP: (88-127)/(52-69) 104/61 (09/25 0600) SpO2:  [95 %-100 %] 99 % (09/25 0600) FiO2 (%):  [40 %-50 %] 40 % (09/25 0331) Weight:  [75.2 kg (165 lb 12.6 oz)] 75.2 kg (165 lb 12.6 oz) (09/25 0600) Weight change: -4.9 kg (-10 lb 12.9 oz)  Intake/Output from previous day: 09/24 0701 - 09/25 0700 In: 1877.9 [I.V.:1575.9; NG/GT:190; IV Piggyback:112] Out: 4632 [Urine:565; Chest Tube:495] Intake/Output this shift: Total I/O In: 706.4 [I.V.:636.4; NG/GT:70] Out: 1885 [Urine:180; Other:1430; Chest Tube:275]  General appearance: pale and edematous, pale Resp: rales bibasilar Chest wall: [facility] HD cath Cardio: S1, S2 normal and systolic murmur: holosystolic 2/6, blowing at apex GI: mod distension, pos bs but limited, distended Extremities: edema 4+  Lab Results:  Recent Labs  06/07/16 0412 06/08/16 0401  WBC 12.6* 14.6*  HGB 10.4* 10.9*  HCT 31.9* 33.5*  PLT 80* 88*   BMET:  Recent Labs  06/07/16 1630 06/08/16 0401  NA 139 138  K 3.8 3.7  CL 106 106  CO2 23 24  GLUCOSE 116* 144*  BUN 46* 37*  CREATININE 2.93* 2.60*  CALCIUM 7.7* 7.5*   No results for input(s): PTH in the last 72 hours. Iron Studies:  Recent Labs  06/07/16 1230  IRON 20*  TIBC 147*    Studies/Results: Dg Chest Port 1 View  Result Date: 06/07/2016 CLINICAL DATA:  60 year old male with a history of endotracheal tube removal. Admission for cardiac surgery with recent CABG. EXAM: PORTABLE CHEST 1 VIEW COMPARISON:  06/07/2016 FINDINGS: Cardiomediastinal silhouette unchanged. Surgical changes of recent median sternotomy and CABG. Endotracheal tube position suitably above the carina approximately 5.5 cm. Unchanged left IJ central venous  catheter, right subclavian central venous catheter, right IJ sheath, through which a Swan-Ganz catheter appears to terminate in the main pulmonary artery. Unchanged gastric tube, terminating out of the field of view. Unchanged bilateral thoracostomy tubes and mediastinal drain. Epicardial pacing leads. Low lung volumes with minimal airspace opacities at the lung bases. No pneumothorax. IMPRESSION: Early surgical changes of median sternotomy and CABG, with unchanged support apparatus compared to the prior. Endotracheal tube terminates suitably above the carina, approximately 5.5 cm. Low lung volumes and atelectasis with no visualized pneumothorax. Signed, Dulcy Fanny. Earleen Newport, DO [facility] Electronically Signed   By: Corrie Mckusick D.O.   On: 06/07/2016 17:21   Dg Chest Port 1 View  Result Date: 06/07/2016 CLINICAL DATA:  Status post CABG. Acute respiratory failure with hypoxemia. EXAM: PORTABLE CHEST 1 VIEW COMPARISON:  06/06/2016. FINDINGS: Endotracheal tube in satisfactory position. Right jugular Swan-Ganz catheter tip in the main pulmonary artery segment. Intra aortic balloon pump tip in the proximal descending thoracic aorta. Mediastinal and bilateral chest tubes remain in place without pneumothorax. Stable right subclavian catheter and left jugular catheter. Stable post CABG changes. The cardiac silhouette remains mildly enlarged. Increased dense left lower lobe opacity. Mildly decreased right basilar atelectasis. No acute bony abnormality. IMPRESSION: 1. Progressive dense left lower lobe atelectasis or pneumonia. 2. Mildly improved right basilar atelectasis. Electronically Signed   By: Claudie Revering M.D.   On: 06/07/2016 09:36    I have reviewed the patient's current medications. Scheduled: . acetaminophen  1,000 mg Oral Q6H  Or  . acetaminophen (TYLENOL) oral liquid 160 mg/5 mL  1,000 mg Per Tube Q6H  . aspirin EC  325 mg Oral Daily   Or  .  aspirin  324 mg Per Tube Daily  . atorvastatin  80 mg Oral q1800  . bisacodyl  10 mg Oral Daily   Or  . bisacodyl  10 mg Rectal Daily  . cefTRIAXone (ROCEPHIN)  IV  2 g Intravenous Q24H  . chlorhexidine gluconate (MEDLINE KIT)  15 mL Mouth Rinse BID  . docusate sodium  200 mg Oral Daily  . ferumoxytol  510 mg Intravenous Weekly  . insulin aspart  0-15 Units Subcutaneous Q4H  . insulin detemir  10 Units Subcutaneous BID  . insulin regular  0-10 Units Intravenous TID WC  . mouth rinse  15 mL Mouth Rinse 10 times per day  . pantoprazole sodium  40 mg Per Tube Daily  . sodium chloride flush  10-40 mL Intracatheter Q12H  . sodium chloride flush  3 mL Intravenous Q12H    Assessment/Plan: 1 AKI  Oliguric ATN, vol xs, bp ok.  CRRT going well with mechanics, solute, acid/base/K/phos.   Severe vol xs yet, bp  tol 100 cc/h off, and if rises, ^ net neg.   2 CKD4 DM 3 DM stable 4 Anemia needs Fe 5 Afib yest 6VDRF vol limiting 7 CAD  Per CVS 8 HPTH pending 9 Infx on AB 10 Nutrition  Will need soon P CRRT, vol off , vent,AB, meds, FE   LOS: 8 days   Faithann Natal L 06/08/2016,6:55 AM

## 2016-06-08 NOTE — Progress Notes (Signed)
Initial Nutrition Assessment  DOCUMENTATION CODES:   Not applicable  INTERVENTION:    Recommend Nepro formula at goal rate of 30 ml/hr with Prostat liquid protein 60 ml BID   Total TF regimen to provide 1696 kcals, 118 gm protein, 523 ml of free water  NUTRITION DIAGNOSIS:   Inadequate oral intake related to inability to eat as evidenced by NPO status, ongoing  GOAL:   Patient will meet greater than or equal to 90% of their needs, progressing  MONITOR:   Vent status, Labs, Weight trends, TF tolerance, Skin, I & O's  ASSESSMENT:   60 year old man with a history of poorly controlled diabetes, hypertension and hyperlipidemia. Over the past few weeks, he had not been feeling well. He thought he got the flu. He was seen at an urgent care in [facility] and was treated for bronchitis. Over the past several days, he has felt worse. He has been unable to lie flat. He had chest discomfort when lying flat. Both shortness of breath and chest discomfort were better when sitting forward.  He also noted lower extremity edema.  Patient is currently intubated on ventilator support >> OGT in place MV: 10.8 L/min Temp (24hrs), Avg:97.4 F (36.3 C), Min:95.9 F (35.5 C), Max:98.4 F (36.9 C)  Pt with 3 vessel CAD, s/p CABG 9/22. Nepro formula currently infusing at 10 ml/hr via CORTRAK small bore feeding tube (placed this AM, tip in 3rd portion of duodenum). Pt receiving CVVHD for volume removal. Labs and medications reviewed.  CBG (last 3)   Recent Labs  06/08/16 0347 06/08/16 0724 06/08/16 1209  GLUCAP 134* 122* 193*    Diet Order:  Diet NPO time specified  Skin:  Reviewed, no issues  Last BM:  9/22  Height:   Ht Readings from Last 1 Encounters:  05/31/16 5\' 6"  (1.676 m)    Weight:   Wt Readings from Last 1 Encounters:  06/08/16 165 lb 12.6 oz (75.2 kg)    Ideal Body Weight:  64.5 kg  BMI:  Body mass index is 26.76 kg/m.  Estimated Nutritional Needs:    Kcal:  1724  Protein:  110-120 gm  Fluid:  per MD  EDUCATION NEEDS:   Education needs no appropriate at this time  Arthur Holms, RD, LDN Pager #: [phone] After-Hours Pager #: [phone]

## 2016-06-08 NOTE — Progress Notes (Signed)
Patient ID: Kenneth [name], male   DOB: 1955/10/16, 60 y.o.   MRN: [id number]     Advanced Heart Failure Rounding Note  Referring Physician: Raylene Miyamoto, MD  Primary Physician: Rodena Medin, MD Primary HF: New ( Dr Aundra Dubin)  Reason for Consultation: Acute respiratory failure  Subjective:    Kenneth [name] is a 60 y.o. male with hx of poorly controlled diabetes, HTN, CKD IV, and HLD.  Admitted 05/31/16 with orthopnea, DOE, and CP. EKG did not meet stemi criteria.  Troponin 1.4. Echo showed global LV dysfunction. Started on NIMV for acute respiratory failure, failed requiring intubation.   Echo 05/31/16 LVEF 35-40%, Grade 2 DD  LHC 05/31/16 showed LM lesion, 40 %stenosed, Ramus lesion, 80 %stenosed, Ost Cx to Prox Cx lesion, 80 %stenosed, Mid LAD lesion, 90 %stenosed, Ost 2nd Diag to 2nd Diag lesion, 90 %stenosed, Prox RCA to Mid RCA lesion, 100 %stenosed. IABP inserted.   Atrial fibrillation with RVR developed 9/18, now on amiodarone gtt and in NSR.   Thoracentesis on right on 9/19,  transudative (1000 cc off).   Thoracentesis on left on 9/21, 600 cc off.   Underwent CABG x 3. 9/22  IABP removed 06/07/16. Attempted to wean vent but had Afib RVR.   CVVHD pulling 100 ml/hr with improvement in volume and creatinine. Weight down another 9 pounds overnight, though remains overloaded.   Remains intubated and sedated.  Milrinone continues at 0.2.   Was in NSR earlier this morning but now back in atrial fibrillation with RVR.   Swan numbers this morning CVP 10 PA 28/18 CI 3.2   Objective:   Weight Range: 165 lb 12.6 oz (75.2 kg) Body mass index is 26.76 kg/m.   Vital Signs:   Temp:  [95.9 F (35.5 C)-98.4 F (36.9 C)] 96.6 F (35.9 C) (09/25 0700) Pulse Rate:  [30-115] 80 (09/25 0700) Resp:  [0-31] 17 (09/25 0700) BP: (88-127)/(52-69) 93/54 (09/25 0700) SpO2:  [95 %-100 %] 100 % (09/25 0700) FiO2 (%):  [40 %-50 %] 40 % (09/25 0331) Weight:  [165 lb 12.6 oz (75.2 kg)] 165 lb  12.6 oz (75.2 kg) (09/25 0600) Last BM Date: 06/05/16  Weight change: Filed Weights   06/06/16 0500 06/07/16 0500 06/08/16 0600  Weight: 183 lb 13.8 oz (83.4 kg) 176 lb 9.4 oz (80.1 kg) 165 lb 12.6 oz (75.2 kg)    Intake/Output:   Intake/Output Summary (Last 24 hours) at 06/08/16 0800 Last data filed at 06/08/16 0730  Gross per 24 hour  Intake          1810.28 ml  Output             4611 ml  Net         -2800.72 ml     Physical Exam: CVP 9-10 General:  Intubated and sedated. Remains markedly edematous HEENT: ET tube + [place] Neck: supple. JVP elevated.  Swan RIJ Carotids 2+ bilat; Cor: Sternal dressing RRR. No M/G/R.  Lungs: Diminished throughout, worse no Left  Abdomen: Distended. Quiet  Extremities: no cyanosis, clubbing, rash. 2-3+edema up to thigs. R groin IABP stable.  Neuro: alert & orientedx3, cranial nerves grossly intact. moves all 4 extremities w/o difficulty. Affect pleasant  Telemetry: Reviewed, A paced 90 (underlying sinus brady 40s) => atrial fibrillation 100s  Labs: CBC  Recent Labs  06/07/16 0412 06/08/16 0401  WBC 12.6* 14.6*  HGB 10.4* 10.9*  HCT 31.9* 33.5*  MCV 88.4 88.9  PLT 80* 88*   Basic  Metabolic Panel  Recent Labs  06/07/16 0412 06/07/16 1630 06/08/16 0401  NA 141 139 138  K 3.7 3.8 3.7  CL 108 106 106  CO2 _0 GLUCOSE 118* 116* 144*  BUN 61* 46* 37*  CREATININE 3.49* 2.93* 2.60*  CALCIUM 7.6* 7.7* 7.5*  MG 2.1  --  2.4  PHOS 4.6 4.2 3.8   Liver Function Tests  Recent Labs  06/07/16 1630 06/08/16 0401  ALBUMIN 1.8* 1.8*   No results for input(s): LIPASE, AMYLASE in the last 72 hours. Cardiac Enzymes No results for input(s): CKTOTAL, CKMB, CKMBINDEX, TROPONINI in the last 72 hours.  BNP: BNP (last 3 results)  Recent Labs  05/31/16 1435  BNP 2,537.9*    ProBNP (last 3 results) No results for input(s): PROBNP in the last 8760 hours.   D-Dimer No results for input(s): DDIMER in the last 72  hours. Hemoglobin A1C No results for input(s): HGBA1C in the last 72 hours. Fasting Lipid Panel No results for input(s): CHOL, HDL, LDLCALC, TRIG, CHOLHDL, LDLDIRECT in the last 72 hours. Thyroid Function Tests No results for input(s): TSH, T4TOTAL, T3FREE, THYROIDAB in the last 72 hours.  Invalid input(s): FREET3  Other results:     Imaging/Studies:  Dg Chest Port 1 View  Result Date: 06/08/2016 CLINICAL DATA:  60 year old male with respiratory failure/ hypoxia post CABG. Subsequent encounter. EXAM: PORTABLE CHEST 1 VIEW COMPARISON:  06/07/2016 chest x-ray. FINDINGS: Endotracheal tube tip 6 cm above the carina. Left central line tip left subclavian vein level. Right Swan-Ganz catheter tip main pulmonary artery level. Right central line tip cavoatrial junction/ proximal right atrium level. Bilateral chest tubes in place. Nasogastric tube partially coiled within the gastric fundus. Tip not completely imaged on present exam. Post CABG.  Mild cardiomegaly. Poor inspiration with bibasilar atelectatic changes. Pulmonary vascular congestion most notable centrally. No gross pneumothorax. IMPRESSION: Post CABG with various lines and catheters in place as detailed above without significant change. Bibasilar atelectasis. Pulmonary vascular prominence most notable centrally. Electronically Signed   By: Genia Del M.D.   On: 06/08/2016 07:39   Dg Chest Port 1 View  Result Date: 06/07/2016 CLINICAL DATA:  60 year old male with a history of endotracheal tube removal. Admission for cardiac surgery with recent CABG. EXAM: PORTABLE CHEST 1 VIEW COMPARISON:  06/07/2016 FINDINGS: Cardiomediastinal silhouette unchanged. Surgical changes of recent median sternotomy and CABG. Endotracheal tube position suitably above the carina approximately 5.5 cm. Unchanged left IJ central venous catheter, right subclavian central venous catheter, right IJ sheath, through which a Swan-Ganz catheter appears to terminate in the  main pulmonary artery. Unchanged gastric tube, terminating out of the field of view. Unchanged bilateral thoracostomy tubes and mediastinal drain. Epicardial pacing leads. Low lung volumes with minimal airspace opacities at the lung bases. No pneumothorax. IMPRESSION: Early surgical changes of median sternotomy and CABG, with unchanged support apparatus compared to the prior. Endotracheal tube terminates suitably above the carina, approximately 5.5 cm. Low lung volumes and atelectasis with no visualized pneumothorax. Signed, Dulcy Fanny. Earleen Newport, DO [facility] Electronically Signed   By: Corrie Mckusick D.O.   On: 06/07/2016 17:21   Dg Chest Port 1 View  Result Date: 06/07/2016 CLINICAL DATA:  Status post CABG. Acute respiratory failure with hypoxemia. EXAM: PORTABLE CHEST 1 VIEW COMPARISON:  06/06/2016. FINDINGS: Endotracheal tube in satisfactory position. Right jugular Swan-Ganz catheter tip in the main pulmonary artery segment. Intra aortic balloon pump tip in the proximal descending thoracic aorta.  Mediastinal and bilateral chest tubes remain in place without pneumothorax. Stable right subclavian catheter and left jugular catheter. Stable post CABG changes. The cardiac silhouette remains mildly enlarged. Increased dense left lower lobe opacity. Mildly decreased right basilar atelectasis. No acute bony abnormality. IMPRESSION: 1. Progressive dense left lower lobe atelectasis or pneumonia. 2. Mildly improved right basilar atelectasis. Electronically Signed   By: Claudie Revering M.D.   On: 06/07/2016 09:36    Latest Echo  Latest Cath   Medications:     Scheduled Medications: . acetaminophen  1,000 mg Oral Q6H   Or  . acetaminophen (TYLENOL) oral liquid 160 mg/5 mL  1,000 mg Per Tube Q6H  . aspirin EC  325 mg Oral Daily   Or  . aspirin  324 mg Per Tube Daily  . atorvastatin  80 mg Oral q1800  . bisacodyl  10 mg Oral Daily   Or  . bisacodyl   10 mg Rectal Daily  . cefTRIAXone (ROCEPHIN)  IV  2 g Intravenous Q24H  . chlorhexidine gluconate (MEDLINE KIT)  15 mL Mouth Rinse BID  . docusate sodium  200 mg Oral Daily  . ferumoxytol  510 mg Intravenous Weekly  . insulin aspart  0-15 Units Subcutaneous Q4H  . insulin detemir  10 Units Subcutaneous BID  . insulin regular  0-10 Units Intravenous TID WC  . mouth rinse  15 mL Mouth Rinse 10 times per day  . pantoprazole sodium  40 mg Per Tube Daily  . sodium chloride flush  10-40 mL Intracatheter Q12H  . sodium chloride flush  3 mL Intravenous Q12H    Infusions: . sodium chloride 10 mL/hr at 06/06/16 0100  . sodium chloride    . sodium chloride 20 mL/hr at 06/08/16 0700  . amiodarone 30 mg/hr (06/08/16 0700)  . dexmedetomidine 0.4 mcg/kg/hr (06/08/16 0700)  . milrinone 0.3 mcg/kg/min (06/08/16 0700)  . nitroGLYCERIN Stopped (06/06/16 0430)  . phenylephrine (NEO-SYNEPHRINE) Adult infusion    . dialysis replacement fluid (prismasate) 400 mL/hr at 06/08/16 0354  . dialysis replacement fluid (prismasate) 200 mL/hr at 06/07/16 1529  . dialysate (PRISMASATE) 1,000 mL/hr at 06/08/16 0606    PRN Medications: sodium chloride, heparin, Influenza vac split quadrivalent PF, lactated ringers, midazolam, morphine injection, ondansetron (ZOFRAN) IV, oxyCODONE, sodium chloride, sodium chloride flush, sodium chloride flush, traMADol   Assessment/Plan   Kenneth [name] is a 60 y.o. male with hx of poorly controlled diabetes, HTN, CKD IV, and HLD.  Admitted 05/31/16 with orthopnea, DOE, and CP.  Intubated, sedated, placed on IABP.  S/p CABG x 3 9/22 (LIMA-LAD, SVG-OM, SVG-RCA)  1. Acute on chronic systolic CHF with cardiogenic shock: EF 35-40% with wall motion abnormalities, ischemic cardiomyopathy.  IABP out. s/p CABG x 3 on 9/22.  Remains on milrinone 0.30 mcg/kg/min, CI 3.2 today by Luiz Blare.  - Continues CVVH for volume removal per renal. - Decrease milrinone to 0.2 now.  - Can pull Luiz Blare today if ok  with Dr Prescott Gum and follow co-ox with milrinone wean.  Send co-ox this afternoon.   - Can add low dose hydralazine/isordil as we wean milrinone.  2. CAD: Admitted with NSTEMI, severe 3VD. S/p 3v CABG 9/22 - ASA, atorvastatin 80.  3. AKI on CKD stage IV:  On CVVHD now. Baseline creatinine seems to be around 2.5.   - CVVHD started 9/23. Weight down 16 pounds since starting.   - Renal following  4. Acute respiratory failure: right thoracentesis and left thoracentesis done.  Lungs more  clear. On minimal vent settings. CCM managing - Failed wean yesterday as went into Afib RVR 5. Anemia: Hgb stable.  6. ID: Remains afebrile with Serratia in sputum. CCM narrowed to ceftriaxone. Today is day 7/7 of antibiotics.  7. Atrial fibrillation: Paroxysmal.  He is back in atrial fibrillation this morning.   - Will bolus amiodarone and run at 60 mg/hr for 8 hours.  - He will need to begin on warfarin when CVTS deems stable for this.  - Weaning milrinone should decrease drive to go into fibrillation.   Length of Stay: [place], [place] 06/08/2016, 8:00 AM  Advanced Heart Failure Team Pager [phone] (M-F; 7a - 4p)  Please contact [facility] for night-coverage after hours (4p -7a ) and weekends on [email or web address]  Patient seen with PA, agree with the above note.  He went into atrial fibrillation this morning then back to NSR with bolus of amiodarone.  BP stable.  Removing fluid via CVVH. - Think we can remove Luiz Blare today if ok with Dr Prescott Gum.  Follow CVP and co-ox off central line.  - Decrease milrinone to 0.2, good cardiac index today. Repeat co-ox in afternoon.  - Can add low dose hydralazine/nitrates as we wean milrinone.   35 minutes critical care time.   Loralie Champagne 06/08/2016 10:20 AM

## 2016-06-08 NOTE — Progress Notes (Signed)
Patient paced AAI 88, went into a-fib rate 120s, Pacer turned off. Dr. Aundra Dubin at bedside and gave orders for 150mg  bolus of Amio and change rate to 60mg  for 8 hours. Patient rhythm changed to sinus 60s, pacer turned back on to AAI at 88, mA increased to 15.

## 2016-06-08 NOTE — Progress Notes (Signed)
Inpatient Diabetes Program Recommendations  AACE/ADA: New Consensus Statement on Inpatient Glycemic Control (2015)  Target Ranges:  Prepandial:   less than 140 mg/dL      Peak postprandial:   less than 180 mg/dL (1-2 hours)      Critically ill patients:  140 - 180 mg/dL   Lab Results  Component Value Date   GLUCAP 122 (H) 06/08/2016   HGBA1C 10.4 (H) 06/04/2016    Review of Glycemic Control Results for Kenneth [name], Kenneth [name] (MRN [id number]) as of 06/08/2016 10:42  Ref. Range 06/07/2016 19:55 06/07/2016 20:48 06/07/2016 23:45 06/08/2016 03:47 06/08/2016 07:24  Glucose-Capillary Latest Ref Range: 65 - 99 mg/dL 115 (H) 132 (H) 119 (H) 134 (H) 122 (H)   Diabetes history: DM1 Outpatient Diabetes medications: Insulin Pump (Total basal 21.6 units/24 hrs., meal coverage 1 units per 10-12 gms carbohydrates) Current orders for Inpatient glycemic control: Levemir 10 units bid +Novolog correcton 0-15 units q 4 hrs.  Inpatient Diabetes Program Recommendations:  When tube feeding started:   Please consider adding Novolog meal coverage q 4 hrs. (hold if held or stopped for any reason) 2-3 units Decrease Novolog correction to sensitive 0-9 units q 4 hrs.  Thank you, Nani Gasser. Liliauna Santoni, RN, MSN, CDE Inpatient Glycemic Control Team Team Pager [phone] (8am-5pm) 06/08/2016 10:53 AM

## 2016-06-08 NOTE — Progress Notes (Signed)
3pm Co-ox 68.6

## 2016-06-08 NOTE — Significant Event (Signed)
Cortrak feeding tube placed via left nare without complications, marked at 90cm at the nare. KUB ordered, pending. Please call team if need to reposition. Once confirm by KUB, may use tube.

## 2016-06-08 NOTE — Progress Notes (Signed)
PULMONARY / CRITICAL CARE MEDICINE   Name: Kenneth [name] MRN: [id number] DOB: [birth year]    ADMISSION DATE:  05/31/2016 CONSULTATION DATE:  05/31/2016   REFERRING MD:  Dr. Dayna Barker, ER  CHIEF COMPLAINT:  Sob, resp failure  HISTORY OF PRESENT ILLNESS:   60 yo male male presented with dyspnea felt to be related to respiratory infection.  He had progressive orthopnea, chest discomfort, lower extremity edema, and respiratory failure from acute pulmonary edema with acute combined CHF.  PMHx of DM, HTN, HLD, CKD 4  SUBJECTIVE:  Went into rapid afib during PST on 9/24. Tolerating CVVH.  Agitation/restlessness issues controlled with precedex.   VITAL SIGNS: BP (!) 114/57 (BP Location: Right Arm)   Pulse 80   Temp 97 F (36.1 C) (Core (Comment))   Resp (!) 23   Ht 5\' 6"  (1.676 m)   Wt 75.2 kg (165 lb 12.6 oz)   SpO2 98%   BMI 26.76 kg/m   HEMODYNAMICS: PAP: (19-56)/(10-23) 28/18 CO:  [6.2 L/min] 6.2 L/min CI:  [3.4 L/min/m2] 3.4 L/min/m2  VENTILATOR SETTINGS: Vent Mode: PRVC FiO2 (%):  [40 %-50 %] 40 % Set Rate:  [16 bmp] 16 bmp Vt Set:  [510 mL] 510 mL PEEP:  [5 cmH20] 5 cmH20 Pressure Support:  [10 cmH20] 10 cmH20 Plateau Pressure:  [19 cmH20-23 cmH20] 23 cmH20  INTAKE / OUTPUT: I/O last 3 completed shifts: In: 2935.5 [I.V.:2463.5; NG/GT:260; IV Piggyback:212] Out: 4742 [Urine:795; Emesis/NG output:125; VZDGL:8756; Chest Tube:815]  PHYSICAL EXAMINATION: General:  On vent. Comfortable. Asleep. NAD.  HENT: NCAT ETT in place. Puffy face. (+) R and L IJ. (+) [place] HD cath PULM: Few crackles, vent supported breaths. Good ae.  CV: variable s1. Paced rhythm. (-) s3/m audible GI: dec BS. Soft. (-) masses/tenderness.  MSK: normal bulk and tone. Gr 2 edema Neuro: sedated on vent. CN grossly intact. (-) lateralizing signs  LABS:  BMET  Recent Labs Lab 06/07/16 0412 06/07/16 1630 06/08/16 0401  NA 141 139 138  K 3.7 3.8 3.7  CL 108 106 106  CO2 25 23 24   BUN 61* 46*  37*  CREATININE 3.49* 2.93* 2.60*  GLUCOSE 118* 116* 144*    Electrolytes  Recent Labs Lab 06/06/16 0500  06/07/16 0412 06/07/16 1630 06/08/16 0401  CALCIUM 7.4*  < > 7.6* 7.7* 7.5*  MG 2.0  --  2.1  --  2.4  PHOS  --   < > 4.6 4.2 3.8  < > = values in this interval not displayed.  CBC  Recent Labs Lab 06/06/16 1506 06/06/16 1515 06/07/16 0412 06/08/16 0401  WBC 13.8*  --  12.6* 14.6*  HGB 10.5* 10.2* 10.4* 10.9*  HCT 31.1* 30.0* 31.9* 33.5*  PLT 84*  --  80* 88*    Coag's  Recent Labs Lab 06/05/16 0344 06/06/16 0054  APTT 120* 39*  INR  --  1.47    Sepsis Markers  Recent Labs Lab 06/01/16 0952  06/02/16 0830 06/03/16 0500 06/04/16 0453  LATICACIDVEN 0.8  --   --   --   --   PROCALCITON  --   < > 0.11 1.06 1.13  < > = values in this interval not displayed.  ABG  Recent Labs Lab 06/06/16 0841 06/07/16 0404 06/08/16 0405  PHART 7.305* 7.369 7.445  PCO2ART 43.1 41.0 37.5  PO2ART 87.0 128.0* 83.0    Liver Enzymes  Recent Labs Lab 06/03/16 0500 06/04/16 0453 06/05/16 0344  06/07/16 0412 06/07/16 1630 06/08/16 0401  AST  18 30 29   --   --   --   --   ALT 36 33 35  --   --   --   --   ALKPHOS 84 93 112  --   --   --   --   BILITOT 0.5 0.7 0.5  --   --   --   --   ALBUMIN 2.0* 1.6* 1.7*  < > 1.8* 1.8* 1.8*  < > = values in this interval not displayed.  Cardiac Enzymes No results for input(s): TROPONINI, PROBNP in the last 168 hours.  Glucose  Recent Labs Lab 06/07/16 1852 06/07/16 1955 06/07/16 2048 06/07/16 2345 06/08/16 0347 06/08/16 0724  GLUCAP 150* 115* 132* 119* 134* 122*    Imaging Dg Chest Port 1 View  Result Date: 06/08/2016 CLINICAL DATA:  60 year old male with respiratory failure/ hypoxia post CABG. Subsequent encounter. EXAM: PORTABLE CHEST 1 VIEW COMPARISON:  06/07/2016 chest x-ray. FINDINGS: Endotracheal tube tip 6 cm above the carina. Left central line tip left subclavian vein level. Right Swan-Ganz catheter  tip main pulmonary artery level. Right central line tip cavoatrial junction/ proximal right atrium level. Bilateral chest tubes in place. Nasogastric tube partially coiled within the gastric fundus. Tip not completely imaged on present exam. Post CABG.  Mild cardiomegaly. Poor inspiration with bibasilar atelectatic changes. Pulmonary vascular congestion most notable centrally. No gross pneumothorax. IMPRESSION: Post CABG with various lines and catheters in place as detailed above without significant change. Bibasilar atelectasis. Pulmonary vascular prominence most notable centrally. Electronically Signed   By: Genia Del M.D.   On: 06/08/2016 07:39   Dg Chest Port 1 View  Result Date: 06/07/2016 CLINICAL DATA:  60 year old male with a history of endotracheal tube removal. Admission for cardiac surgery with recent CABG. EXAM: PORTABLE CHEST 1 VIEW COMPARISON:  06/07/2016 FINDINGS: Cardiomediastinal silhouette unchanged. Surgical changes of recent median sternotomy and CABG. Endotracheal tube position suitably above the carina approximately 5.5 cm. Unchanged left IJ central venous catheter, right subclavian central venous catheter, right IJ sheath, through which a Swan-Ganz catheter appears to terminate in the main pulmonary artery. Unchanged gastric tube, terminating out of the field of view. Unchanged bilateral thoracostomy tubes and mediastinal drain. Epicardial pacing leads. Low lung volumes with minimal airspace opacities at the lung bases. No pneumothorax. IMPRESSION: Early surgical changes of median sternotomy and CABG, with unchanged support apparatus compared to the prior. Endotracheal tube terminates suitably above the carina, approximately 5.5 cm. Low lung volumes and atelectasis with no visualized pneumothorax. Signed, Dulcy Fanny. Earleen Newport, DO [facility]iology Electronically Signed   By: Corrie Mckusick D.O.   On: 06/07/2016 17:21   STUDIES:  Echo  9/17 >> EF 35 to 40%, grade 2 DD [facility] 9/17 >> severe 3 vessel disease Rt thoracentesis 9/19 >> glucose 168, protein < 3, LDH 61, WBC 117  CULTURES: Blood 9/19 >> (-) Sputum 9/19 >> moderate  Serratia marcescens Rt pleural fluid 9/19 >> (-)  ANTIBIOTICS: Vancomycin 9/19 >> 9/22 Cefuroxime 9/22 > 9/24 Zosyn 9/19 >>9/22 Ceftriaxone 9/24 >   SIGNIFICANT EVENTS: 9[facility]17 [facility] to [facility], [place], Cardiology consulted, IABP 9/18 TCTS consulted, a fib with RVR >> amiodarone bolus 9/22 CABG  LINES/TUBES: 9/17 ETT >>  9/17 IABP >> 9/17 Rt IJ Introducer >>   9/22 R subclavian HD cath> 9/22 Chest tube/mediastinal tubes >>   ASSESSMENT / PLAN:  PULMONARY A: Acute hypoxic respiratory failure 2nd to acute pulmonary edema, pleural effusions, pneumonia >>  minimal vent settings  P:   Cont vent support.  Daily PST as tolerated. He still has volume issues so I do not think we can extubate today. Anticipate in 1-2 days Continue volume removal vi CVVH Adjust PEEP/FiO2 to keep SpO2 > 90%   CARDIOVASCULAR A:  Cardiogenic shock Acute combined CHF 3 vessel CAD> s/p CABG 9/22 HTN A fib  P:  Post op care per TCTS Vasopressors/cardioactive meds per Cardiology/TCTS On amiodarone and milrinone drips  RENAL A:   AKI, oliguric, ATN on top of CKD stIV CKD 4 >> baseline creatinine 2.57 from 05/21/16 P:   Continue CRRT per renal Monitor BMET and UOP Replace electrolytes as needed   GASTROINTESTINAL A:   Nutrition P:   Tube feedings per surgery protocol > to start TF today Protonix for SUP  HEMATOLOGIC A:   Anemia of critical illness, chronic disease P:  Monitor for bleeding CBD in AM  INFECTIOUS A:   Pneumonia with Serratia in sputum cx P:   Cont Rocephin for now. Serratia was resistant to Cefazolin.    ENDOCRINE A:   DM P:   Insulin gtt switched to q4SS   NEUROLOGIC A:   Acute metabolic encephalopathy P:   RASS goal: -1 Precedex per surgery protocol Has prn  Morphine and versed.   CC time 30 minutes No family around.   Monica Becton, MD 06/08/2016, 8:44 AM [facility] Pager [phone] After 3 pm or if no answer, call [phone]

## 2016-06-09 ENCOUNTER — Inpatient Hospital Stay (HOSPITAL_COMMUNITY): Payer: BLUE CROSS/BLUE SHIELD

## 2016-06-09 LAB — RENAL FUNCTION PANEL
ALBUMIN: 1.7 g/dL — AB (ref 3.5–5.0)
ANION GAP: 8 (ref 5–15)
ANION GAP: 7 (ref 5–15)
Albumin: 1.9 g/dL — ABNORMAL LOW (ref 3.5–5.0)
BUN: 26 mg/dL — ABNORMAL HIGH (ref 6–20)
BUN: 21 mg/dL — ABNORMAL HIGH (ref 6–20)
CALCIUM: 8 mg/dL — AB (ref 8.9–10.3)
CHLORIDE: 106 mmol/L (ref 101–111)
CO2: 25 mmol/L (ref 22–32)
CO2: 25 mmol/L (ref 22–32)
Calcium: 8.1 mg/dL — ABNORMAL LOW (ref 8.9–10.3)
Chloride: 105 mmol/L (ref 101–111)
Creatinine, Ser: 2.11 mg/dL — ABNORMAL HIGH (ref 0.61–1.24)
Creatinine, Ser: 2.12 mg/dL — ABNORMAL HIGH (ref 0.61–1.24)
GFR calc non Af Amer: 33 mL/min — ABNORMAL LOW (ref >60)
GFR, EST AFRICAN AMERICAN: 38 mL/min — AB (ref >60)
GFR, EST AFRICAN AMERICAN: 38 mL/min — AB (ref >60)
GFR, EST NON AFRICAN AMERICAN: 33 mL/min — AB (ref >60)
GLUCOSE: 144 mg/dL — AB (ref 65–99)
GLUCOSE: 99 mg/dL (ref 65–99)
PHOSPHORUS: 2.9 mg/dL (ref 2.5–4.6)
POTASSIUM: 3.6 mmol/L (ref 3.5–5.1)
Phosphorus: 2.5 mg/dL (ref 2.5–4.6)
Potassium: 3.6 mmol/L (ref 3.5–5.1)
SODIUM: 138 mmol/L (ref 135–145)
Sodium: 138 mmol/L (ref 135–145)

## 2016-06-09 LAB — POCT I-STAT 3, ART BLOOD GAS (G3+)
Acid-Base Excess: 2 mmol/L (ref 0.0–2.0)
Acid-Base Excess: 2 mmol/L (ref 0.0–2.0)
Acid-base deficit: 3 mmol/L — ABNORMAL HIGH (ref 0.0–2.0)
Bicarbonate: 26.1 mmol/L (ref 20.0–28.0)
Bicarbonate: 21.2 mmol/L (ref 20.0–28.0)
Bicarbonate: 26.3 mmol/L (ref 20.0–28.0)
O2 Saturation: 99 %
O2 Saturation: 99 %
O2 Saturation: 96 %
Patient temperature: 36.4
Patient temperature: 37
TCO2: 27 mmol/L (ref 0–100)
TCO2: 22 mmol/L (ref 0–100)
TCO2: 27 mmol/L (ref 0–100)
pCO2 arterial: 38.2 mmHg (ref 32.0–48.0)
pCO2 arterial: 31.6 mmHg — ABNORMAL LOW (ref 32.0–48.0)
pCO2 arterial: 38.8 mmHg (ref 32.0–48.0)
pH, Arterial: 7.44 (ref 7.350–7.450)
pH, Arterial: 7.434 (ref 7.350–7.450)
pH, Arterial: 7.439 (ref 7.350–7.450)
pO2, Arterial: 112 mmHg — ABNORMAL HIGH (ref 83.0–108.0)
pO2, Arterial: 117 mmHg — ABNORMAL HIGH (ref 83.0–108.0)
pO2, Arterial: 81 mmHg — ABNORMAL LOW (ref 83.0–108.0)

## 2016-06-09 LAB — GLUCOSE, CAPILLARY
Glucose-Capillary: 101 mg/dL — ABNORMAL HIGH (ref 65–99)
Glucose-Capillary: 109 mg/dL — ABNORMAL HIGH (ref 65–99)
Glucose-Capillary: 116 mg/dL — ABNORMAL HIGH (ref 65–99)
Glucose-Capillary: 119 mg/dL — ABNORMAL HIGH (ref 65–99)
Glucose-Capillary: 135 mg/dL — ABNORMAL HIGH (ref 65–99)
Glucose-Capillary: 147 mg/dL — ABNORMAL HIGH (ref 65–99)
Glucose-Capillary: 86 mg/dL (ref 65–99)

## 2016-06-09 LAB — COMPREHENSIVE METABOLIC PANEL
ALT: 42 U/L (ref 17–63)
AST: 50 U/L — ABNORMAL HIGH (ref 15–41)
Albumin: 1.7 g/dL — ABNORMAL LOW (ref 3.5–5.0)
Alkaline Phosphatase: 126 U/L (ref 38–126)
Anion gap: 7 (ref 5–15)
BUN: 24 mg/dL — ABNORMAL HIGH (ref 6–20)
CO2: 25 mmol/L (ref 22–32)
Calcium: 8 mg/dL — ABNORMAL LOW (ref 8.9–10.3)
Chloride: 106 mmol/L (ref 101–111)
Creatinine, Ser: 2.18 mg/dL — ABNORMAL HIGH (ref 0.61–1.24)
GFR calc Af Amer: 37 mL/min — ABNORMAL LOW (ref >60)
GFR calc non Af Amer: 32 mL/min — ABNORMAL LOW (ref >60)
Glucose, Bld: 142 mg/dL — ABNORMAL HIGH (ref 65–99)
Potassium: 3.6 mmol/L (ref 3.5–5.1)
Sodium: 138 mmol/L (ref 135–145)
Total Bilirubin: 0.6 mg/dL (ref 0.3–1.2)
Total Protein: 4.4 g/dL — ABNORMAL LOW (ref 6.5–8.1)

## 2016-06-09 LAB — BLOOD GAS, ARTERIAL

## 2016-06-09 LAB — CARBOXYHEMOGLOBIN
Carboxyhemoglobin: 1 % (ref 0.5–1.5)
Carboxyhemoglobin: 0.8 % (ref 0.5–1.5)
METHEMOGLOBIN: 1.8 % — AB (ref 0.0–1.5)
Methemoglobin: 1.5 % (ref 0.0–1.5)
O2 SAT: 68.6 %
O2 SAT: 56.9 %
TOTAL HEMOGLOBIN: 10.6 g/dL — AB (ref 12.0–16.0)
TOTAL HEMOGLOBIN: 11.3 g/dL — AB (ref 12.0–16.0)

## 2016-06-09 LAB — CBC
HCT: 34.1 % — ABNORMAL LOW (ref 39.0–52.0)
Hemoglobin: 11.1 g/dL — ABNORMAL LOW (ref 13.0–17.0)
MCH: 28.9 pg (ref 26.0–34.0)
MCHC: 32.6 g/dL (ref 30.0–36.0)
MCV: 88.8 fL (ref 78.0–100.0)
Platelets: 109 10*3/uL — ABNORMAL LOW (ref 150–400)
RBC: 3.84 MIL/uL — ABNORMAL LOW (ref 4.22–5.81)
RDW: 14.6 % (ref 11.5–15.5)
WBC: 15.8 10*3/uL — ABNORMAL HIGH (ref 4.0–10.5)

## 2016-06-09 LAB — PARATHYROID HORMONE, INTACT (NO CA): PTH: 66 pg/mL — ABNORMAL HIGH (ref 15–65)

## 2016-06-09 LAB — MAGNESIUM: MAGNESIUM: 2.4 mg/dL (ref 1.7–2.4)

## 2016-06-09 MED ORDER — AMIODARONE LOAD VIA INFUSION
150.0000 mg | Freq: Once | INTRAVENOUS | Status: AC
  Administered 2016-06-09: 150 mg via INTRAVENOUS

## 2016-06-09 MED ORDER — CHLORHEXIDINE GLUCONATE 0.12 % MT SOLN
15.0000 mL | Freq: Two times a day (BID) | OROMUCOSAL | Status: DC
  Administered 2016-06-09 – 2016-06-19 (×15): 15 mL via OROMUCOSAL
  Filled 2016-06-09 (×15): qty 15

## 2016-06-09 MED ORDER — DEXMEDETOMIDINE HCL IN NACL 400 MCG/100ML IV SOLN
0.3000 ug/kg/h | INTRAVENOUS | Status: DC
  Administered 2016-06-09: 0.3 ug/kg/h via INTRAVENOUS
  Administered 2016-06-10: 0.2 ug/kg/h via INTRAVENOUS
  Filled 2016-06-09: qty 100

## 2016-06-09 MED ORDER — ORAL CARE MOUTH RINSE
15.0000 mL | Freq: Two times a day (BID) | OROMUCOSAL | Status: DC
  Administered 2016-06-09 – 2016-06-18 (×7): 15 mL via OROMUCOSAL

## 2016-06-09 MED ORDER — INSULIN DETEMIR 100 UNIT/ML ~~LOC~~ SOLN
6.0000 [IU] | Freq: Two times a day (BID) | SUBCUTANEOUS | Status: DC
  Administered 2016-06-09 – 2016-06-18 (×15): 6 [IU] via SUBCUTANEOUS
  Filled 2016-06-09 (×19): qty 0.06

## 2016-06-09 MED ORDER — PANTOPRAZOLE SODIUM 40 MG PO TBEC
40.0000 mg | DELAYED_RELEASE_TABLET | Freq: Every day | ORAL | Status: DC
  Administered 2016-06-10 – 2016-06-12 (×3): 40 mg via ORAL
  Filled 2016-06-09 (×4): qty 1

## 2016-06-09 MED ORDER — HEPARIN SODIUM (PORCINE) 5000 UNIT/ML IJ SOLN
5000.0000 [IU] | Freq: Two times a day (BID) | INTRAMUSCULAR | Status: DC
  Administered 2016-06-09 – 2016-06-23 (×26): 5000 [IU] via SUBCUTANEOUS
  Filled 2016-06-09 (×26): qty 1

## 2016-06-09 MED ORDER — HEPARIN SODIUM (PORCINE) 1000 UNIT/ML IJ SOLN: 5000.0000 [IU] | Freq: Two times a day (BID) | INTRAMUSCULAR | Status: DC

## 2016-06-09 NOTE — Progress Notes (Signed)
Patient ID: Kenneth [name], male   DOB: Oct 06, 1955, 60 y.o.   MRN: [id number] EVENING ROUNDS NOTE :     [address]       [place], [address]             [phone]                 4 Days Post-Op Procedure(s) (LRB): CORONARY ARTERY BYPASS GRAFTING (CABG) x3 LIMA to LAD -SVG to OM -SVG to RCA (N/A) INTRAOPERATIVE TRANSESOPHAGEAL ECHOCARDIOGRAM (N/A) INSERTION OF DIALYSIS/trialysis CATHETER (N/A)  Total Length of Stay:  LOS: 9 days  BP 123/72 (BP Location: Right Arm)   Pulse (!) 110   Temp 98.8 F (37.1 C) (Core (Comment))   Resp (!) 25   Ht 5\' 6"  (1.676 m)   Wt 148 lb 13 oz (67.5 kg)   SpO2 93%   BMI 24.02 kg/m   .Intake/Output      09/25 0701 - 09/26 0700 09/26 0701 - 09/27 0700   I.V. (mL/kg) 1491.4 (22.1) 436 (6.5)   NG/GT 697 35   IV Piggyback 167 50   Total Intake(mL/kg) 2355.4 (34.9) 521 (7.7)   Urine (mL/kg/hr) 186 (0.1) 125 (0.2)   Emesis/NG output 50 (0) 50 (0.1)   Other 4896 (3) 1695 (2.1)   Stool 0 (0)    Chest Tube 360 (0.2) 100 (0.1)   Total Output 5492 1970   Net -3136.6 -1449        Stool Occurrence 1 x      . sodium chloride 250 mL (06/09/16 1800)  . amiodarone 30 mg/hr (06/09/16 1800)  . dexmedetomidine 0.2 mcg/kg/hr (06/09/16 1800)  . milrinone 0.2 mcg/kg/min (06/09/16 1800)  . nitroGLYCERIN Stopped (06/06/16 0430)  . phenylephrine (NEO-SYNEPHRINE) Adult infusion    . dialysis replacement fluid (prismasate) 400 mL/hr at 06/09/16 1835  . dialysis replacement fluid (prismasate) 200 mL/hr at 06/08/16 1650  . dialysate (PRISMASATE) 1,000 mL/hr at 06/09/16 1810     Lab Results  Component Value Date   WBC 15.8 (H) 06/09/2016   HGB 11.1 (L) 06/09/2016   HCT 34.1 (L) 06/09/2016   PLT 109 (L) 06/09/2016   GLUCOSE 99 06/09/2016   TRIG 132 06/03/2016   ALT 42 06/09/2016   AST 50 (H) 06/09/2016   NA 138 06/09/2016   K 3.6 06/09/2016   CL 106 06/09/2016   CREATININE 2.12 (H) 06/09/2016   BUN 21 (H) 06/09/2016   CO2 25 06/09/2016    INR 1.47 06/06/2016   HGBA1C 10.4 (H) 06/04/2016   In and out of afib,  On cvvh Cr improving   [name] MD  Beeper [phone] Office [phone] 06/09/2016 6:53 PM

## 2016-06-09 NOTE — Procedures (Signed)
Extubation Procedure Note  Patient Details:   Name: Kenneth [name] DOB: 02/08/1956 MRN: [id number]   Airway Documentation:     Evaluation  O2 sats: stable throughout Complications: No apparent complications Patient did tolerate procedure well. Bilateral Breath Sounds: Clear   Yes   Patient was extubated to a 4L [place] per MD order. Patient had a cuff leak. No stridor was noted. RN was at the bedside with RT during extubation. RT will continue to monitor.  Renato Gails [facility] 06/09/2016, 11:45 AM

## 2016-06-09 NOTE — Progress Notes (Signed)
Patient had a NIF of -20 and a VC of 1.5L with good effort. RT will continue to monitor.

## 2016-06-09 NOTE — Progress Notes (Addendum)
PULMONARY / CRITICAL CARE MEDICINE   Name: KYRUS HYDE MRN: [id number] DOB: [birth year]    ADMISSION DATE:  05/31/2016 CONSULTATION DATE:  05/31/2016   REFERRING MD:  Dr. Dayna Barker, ER  CHIEF COMPLAINT:  Sob, resp failure  HISTORY OF PRESENT ILLNESS:   60 yo male male presented with dyspnea felt to be related to respiratory infection.  He had progressive orthopnea, chest discomfort, lower extremity edema, and respiratory failure from acute pulmonary edema with acute combined CHF.   SUBJECTIVE:  Went into rapid afib x2 (during PST and while on full support) Tolerating CVVH.  Agitation/restlessness issues controlled with precedex.   VITAL SIGNS: BP (!) 101/58 (BP Location: Right Arm)   Pulse 88   Temp 97.3 F (36.3 C) (Core (Comment))   Resp 16   Ht 5\' 6"  (1.676 m)   Wt 67.5 kg (148 lb 13 oz)   SpO2 100%   BMI 24.02 kg/m   HEMODYNAMICS: PAP: (25-28)/(13-16) 25/13 CVP:  [4 mmHg-11 mmHg] 4 mmHg  VENTILATOR SETTINGS: Vent Mode: PSV;CPAP FiO2 (%):  [40 %] 40 % Set Rate:  [16 bmp] 16 bmp Vt Set:  [510 mL] 510 mL PEEP:  [5 cmH20] 5 cmH20 Pressure Support:  [5 cmH20-10 cmH20] 5 cmH20 Plateau Pressure:  [11 cmH20-23 cmH20] 23 cmH20  INTAKE / OUTPUT: I/O last 3 completed shifts: In: 3113.7 [I.V.:2179.7; NG/GT:767; IV UVOZDGUYQ:034] Out: 7425 [Urine:366; Emesis/NG output:50; ZDGLO:7564; Chest Tube:635]  PHYSICAL EXAMINATION: General:  On vent. Comfortable. Asleep. NAD.  HENT: NCAT ETT in place. Puffy face. (+) R and L IJ. (+) [facility] HD cath PULM: Few crackles, vent supported breaths. Good ae.  CV: variable s1. Paced rhythm. (-) s3/m audible GI: dec BS. Soft. (-) masses/tenderness.  MSK: normal bulk and tone. Gr 2 edema Neuro: sedated on vent. CN grossly intact. (-) lateralizing signs  LABS:  BMET  Recent Labs Lab 06/08/16 0401 06/08/16 1508 06/09/16 0326  NA 138 139 138  138  K 3.7 3.9 3.6  3.6  CL 106 106 106  105  CO2 24 28 25  25   BUN 37* 31* 24*  26*   CREATININE 2.60* 2.37* 2.18*  2.11*  GLUCOSE 144* 159* 142*  144*    Electrolytes  Recent Labs Lab 06/07/16 0412  06/08/16 0401 06/08/16 1508 06/09/16 0326  CALCIUM 7.6*  < > 7.5* 7.8* 8.0*  8.0*  MG 2.1  --  2.4  --  2.4  PHOS 4.6  < > 3.8 3.2 2.9  < > = values in this interval not displayed.  CBC  Recent Labs Lab 06/07/16 0412 06/08/16 0401 06/09/16 0326  WBC 12.6* 14.6* 15.8*  HGB 10.4* 10.9* 11.1*  HCT 31.9* 33.5* 34.1*  PLT 80* 88* 109*    Coag's  Recent Labs Lab 06/05/16 0344 06/06/16 0054  APTT 120* 39*  INR  --  1.47    Sepsis Markers  Recent Labs Lab 06/03/16 0500 06/04/16 0453  PROCALCITON 1.06 1.13    ABG  Recent Labs Lab 06/07/16 0404 06/08/16 0405 06/09/16 0323  PHART 7.369 7.445 7.440  PCO2ART 41.0 37.5 38.2  PO2ART 128.0* 83.0 112.0*    Liver Enzymes  Recent Labs Lab 06/04/16 0453 06/05/16 0344  06/08/16 0401 06/08/16 1508 06/09/16 0326  AST 30 29  --   --   --  50*  ALT 33 35  --   --   --  42  ALKPHOS 93 112  --   --   --  126  BILITOT  0.7 0.5  --   --   --  0.6  ALBUMIN 1.6* 1.7*  < > 1.8* 1.8* 1.7*  1.7*  < > = values in this interval not displayed.  Cardiac Enzymes No results for input(s): TROPONINI, PROBNP in the last 168 hours.  Glucose  Recent Labs Lab 06/08/16 1209 06/08/16 1604 06/08/16 1953 06/09/16 0014 06/09/16 0420 06/09/16 0719  GLUCAP 193* 156* 143* 147* 119* 135*    Imaging Dg Chest Port 1 View  Result Date: 06/09/2016 CLINICAL DATA:  60 year old male post CABG.  Subsequent encounter. EXAM: PORTABLE CHEST 1 VIEW COMPARISON:  06/08/2016 chest x-ray FINDINGS: Endotracheal tube tip 5.9 cm above the carina. Left central line tip left subclavian level. Nasogastric tube and Panda tube in place. Tip not imaged on the current exam. Right central line tip caval atrial junction/proximal right atrium without change. Mediastinal drain and left chest tube in place. Swan-Ganz catheter and right  chest tube and been removed. No pneumothorax detected. Basilar atelectasis greater on the right without significant change. Cannot exclude small right-sided pleural effusion. Central pulmonary vascular prominence stable. Heart size top-normal post CABG. IMPRESSION: Right chest tube and Swan-Ganz catheter been removed. No pneumothorax detected. Remainder of findings without significant change as detailed above. Electronically Signed   By: Genia Del M.D.   On: 06/09/2016 08:29   Dg Abd Portable 1v  Result Date: 06/08/2016 CLINICAL DATA:  60 year old male with a history of feeding tube placement EXAM: PORTABLE ABDOMEN - 1 VIEW COMPARISON:  06/01/2016, chest x-ray 06/08/2016 FINDINGS: Limited plain film of the abdomen. Weighted tip enteric feeding tube terminates in the third portion the duodenum. Gastric tube terminates within the stomach. Epicardial pacing leads project over the upper abdomen and lower chest. Partial visualization of Swan-Ganz catheter at the lower chest. 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 Gas within stomach, and small bowel. IMPRESSION: Weighted tip enteric feeding tube terminates within the third portion of duodenum. Gastric tube terminates within the stomach. Epicardial pacing leads project over the upper abdomen, with surgical changes of median sternotomy and CABG. Swan-Ganz catheter partially visualized. Gas within stomach and small bowel. Signed, Dulcy Fanny. Earleen Newport, DO [facility] Electronically Signed   By: Corrie Mckusick D.O.   On: 06/08/2016 10:35   STUDIES:  Echo 9/17 >> EF 35 to 40%, grade 2 DD [facility] 9/17 >> severe 3 vessel disease Rt thoracentesis 9/19 >> glucose 168, protein < 3, LDH 61, WBC 117  CULTURES: Blood 9/19 >> (-) Sputum 9/19 >> moderate  Serratia marcescens Rt pleural fluid 9/19 >> (-)  ANTIBIOTICS: Vancomycin 9/19 >> 9/22 Cefuroxime 9/22 > 9/24 Zosyn 9/19 >>9/22 Ceftriaxone 9/24 >    SIGNIFICANT EVENTS: 9/17 from [facility] to [facility], [place], Cardiology consulted, IABP 9/18 TCTS consulted, a fib with RVR >> amiodarone bolus 9/22 CABG  LINES/TUBES: 9/17 ETT >>  9/17 IABP >> 9/17 Rt IJ Introducer >>   9/22 R subclavian HD cath> 9/22 Chest tube/mediastinal tubes >>   ASSESSMENT / PLAN:  PULMONARY A: Acute hypoxic respiratory failure 2nd to acute pulmonary edema, pleural effusions, pneumonia >> minimal vent settings  P:   CXR this am significantly improved (less edema) and he is finally (-) this am. Cont vent support.  Doing well on PST. Good TV. Anticipate we can extubate this am.  Plan to extubate to BiPaP.  Continue volume removal via CVVH   CARDIOVASCULAR A:  Cardiogenic shock, improved.  Acute combined CHF, improved.  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 SCDs   INFECTIOUS A:   Pneumonia with Serratia in sputum cx P:   Cont Rocephin for now. Serratia was resistant to Cefazolin. Plan for 7 days (stop date: 9/30)    ENDOCRINE A:   DM P:   Cont insulin SS   NEUROLOGIC A:   Acute metabolic encephalopathy P:   RASS goal: -1 Precedex per surgery protocol Has prn Morphine and versed.   CC time 35 minutes No family around. I called up and updated wife via phone.   Monica Becton, MD 06/09/2016, 8:35 AM [facility] Pager [phone]  [phone] After 3 pm or if no answer, call [phone]

## 2016-06-09 NOTE — Progress Notes (Signed)
   LB PCCM  Pt extubated. Looks good.  Tolerating extubation so far. VSS. Fair cough.  Doing IS.  Bipap prn.  Monica Becton, MD 06/09/2016, 12:54 PM [facility] Pager [phone] After 3 pm or if no answer, call [phone]

## 2016-06-09 NOTE — Progress Notes (Signed)
Patient went into rapid a-fib for second time this shift. HR 130s. Pacer turned off, pt rhythm changed to sinus 60s, pacer placed back in AAI at 90. BP maintained throughout episode.

## 2016-06-09 NOTE — Progress Notes (Signed)
Cortrak removed accidentally with extubation. Dr. Prescott Gum aware.

## 2016-06-09 NOTE — Progress Notes (Signed)
4 Days Post-Op Procedure(s) (LRB): CORONARY ARTERY BYPASS GRAFTING (CABG) x3 LIMA to LAD -SVG to OM -SVG to RCA (N/A) INTRAOPERATIVE TRANSESOPHAGEAL ECHOCARDIOGRAM (N/A) INSERTION OF DIALYSIS/trialysis CATHETER (N/A) Subjective: Weaned to extubate this am IV rocephin for gram neg pneumonia CVVH continues nsr on iv amio Objective: Vital signs in last 24 hours: Temp:  [97.3 F (36.3 C)-98.4 F (36.9 C)] 98.2 F (36.8 C) (09/26 1100) Pulse Rate:  [60-89] 88 (09/26 1141) Cardiac Rhythm: Atrial paced (09/26 0800) Resp:  [15-33] 17 (09/26 1100) BP: (73-172)/(45-70) 139/65 (09/26 1141) SpO2:  [97 %-100 %] 100 % (09/26 1100) FiO2 (%):  [40 %] 40 % (09/26 1000) Weight:  [148 lb 13 oz (67.5 kg)] 148 lb 13 oz (67.5 kg) (09/26 0500)  Hemodynamic parameters for last 24 hours: CVP:  [4 mmHg-9 mmHg] 7 mmHg  Intake/Output from previous day: 09/25 0701 - 09/26 0700 In: 2355.4 [I.V.:1491.4; NG/GT:697; IV Piggyback:167] Out: 2130 [Urine:186; Emesis/NG output:50; Chest Tube:360] Intake/Output this shift: Total I/O In: 261.9 [I.V.:176.9; NG/GT:35; IV Piggyback:50] Out: 683 [Urine:18; Emesis/NG output:50; Other:605; Chest Tube:10]       Exam    General- alert and comfortable   Lungs- clear without rales, wheezes   Cor- regular rate and rhythm, no murmur , gallop   Abdomen- soft, non-tender   Extremities - warm, non-tender, minimal edema   Neuro- oriented, appropriate, no focal weakness   Lab Results:  Recent Labs  06/08/16 0401 06/09/16 0326  WBC 14.6* 15.8*  HGB 10.9* 11.1*  HCT 33.5* 34.1*  PLT 88* 109*   BMET:  Recent Labs  06/08/16 1508 06/09/16 0326  NA 139 138  138  K 3.9 3.6  3.6  CL 106 106  105  CO2 28 25  25   GLUCOSE 159* 142*  144*  BUN 31* 24*  26*  CREATININE 2.37* 2.18*  2.11*  CALCIUM 7.8* 8.0*  8.0*    PT/INR: No results for input(s): LABPROT, INR in the last 72 hours. ABG    Component Value Date/Time   PHART 7.434 06/09/2016 1118   HCO3  21.2 06/09/2016 1118   TCO2 22 06/09/2016 1118   ACIDBASEDEF 3.0 (H) 06/09/2016 1118   O2SAT 99.0 06/09/2016 1118   CBG (last 3)   Recent Labs  06/09/16 0420 06/09/16 0719 06/09/16 1123  GLUCAP 119* 135* 116*    Assessment/Plan: S/P Procedure(s) (LRB): CORONARY ARTERY BYPASS GRAFTING (CABG) x3 LIMA to LAD -SVG to OM -SVG to RCA (N/A) INTRAOPERATIVE TRANSESOPHAGEAL ECHOCARDIOGRAM (N/A) INSERTION OF DIALYSIS/trialysis CATHETER (N/A) Mobilize extubated, cont CVVH   LOS: 9 days    Tharon Aquas Trigt III 06/09/2016

## 2016-06-09 NOTE — Progress Notes (Signed)
Patient ID: Kenneth [name], male   DOB: 10/11/1955, 60 y.o.   MRN: [id number]     Advanced Heart Failure Rounding Note  Referring Physician: Raylene Miyamoto, MD  Primary Physician: Rodena Medin, MD Primary HF: New ( Dr Aundra Dubin)  Reason for Consultation: Acute respiratory failure  Subjective:    Kenneth [name] is a 60 y.o. male with hx of poorly controlled diabetes, HTN, CKD IV, and HLD.  Admitted 05/31/16 with orthopnea, DOE, and CP. EKG did not meet stemi criteria.  Troponin 1.4. Echo showed global LV dysfunction. Started on NIMV for acute respiratory failure, failed requiring intubation.   Echo 05/31/16 LVEF 35-40%, Grade 2 DD  LHC 05/31/16 showed LM lesion, 40 %stenosed, Ramus lesion, 80 %stenosed, Ost Cx to Prox Cx lesion, 80 %stenosed, Mid LAD lesion, 90 %stenosed, Ost 2nd Diag to 2nd Diag lesion, 90 %stenosed, Prox RCA to Mid RCA lesion, 100 %stenosed. IABP inserted.   Atrial fibrillation with RVR developed 9/18, now on amiodarone gtt and in NSR.   Thoracentesis on right on 9/19,  transudative (1000 cc off).   Thoracentesis on left on 9/21, 600 cc off.   Underwent CABG x 3. 9/22  IABP removed 06/07/16. Attempted to wean vent but had Afib RVR.   Remains on Milrinone 0.2 mcg/kg/min. CVP 6.   On CVVHD. Weight shows down 17 lbs overnight. Remains intubated. Eyes opening and following commands.  Milrinone continues at 0.2.  Co-ox 57%, CVP 7.    Objective:   Weight Range: 148 lb 13 oz (67.5 kg) Body mass index is 24.02 kg/m.   Vital Signs:   Temp:  [97.3 F (36.3 C)-98.4 F (36.9 C)] 97.3 F (36.3 C) (09/26 0900) Pulse Rate:  [60-89] 88 (09/26 0900) Resp:  [15-33] 16 (09/26 0900) BP: (73-172)/(45-70) 129/70 (09/26 0900) SpO2:  [97 %-100 %] 100 % (09/26 0900) FiO2 (%):  [40 %] 40 % (09/26 0900) Weight:  [148 lb 13 oz (67.5 kg)] 148 lb 13 oz (67.5 kg) (09/26 0500) Last BM Date: 06/08/16  Weight change: Filed Weights   06/07/16 0500 06/08/16 0600 06/09/16 0500  Weight: 176  lb 9.4 oz (80.1 kg) 165 lb 12.6 oz (75.2 kg) 148 lb 13 oz (67.5 kg)    Intake/Output:   Intake/Output Summary (Last 24 hours) at 06/09/16 0943 Last data filed at 06/09/16 0913  Gross per 24 hour  Intake          2205.13 ml  Output             5474 ml  Net         -3268.87 ml     Physical Exam: CVP 6-7 General:  Intubated. Eye opens follows commands.  HEENT: ET tube + Cortrack Neck: supple. JVP 6-7.  Swan RIJ Carotids 2+ bilat; Cor: Sternal dressing RRR. No M/G/R.  Lungs: Diminished throughout, worse no Left  Abdomen: Distended. Quiet  Extremities: no cyanosis, clubbing, rash. 1-2 + edema to knees. R groin IABP stable.  Neuro: alert & orientedx3, cranial nerves grossly intact. moves all 4 extremities w/o difficulty. Affect pleasant  Telemetry: Reviewed, A paced 80s this am.  Short episode of afib overnight.   Labs: CBC  Recent Labs  06/08/16 0401 06/09/16 0326  WBC 14.6* 15.8*  HGB 10.9* 11.1*  HCT 33.5* 34.1*  MCV 88.9 88.8  PLT 88* 409*   Basic Metabolic Panel  Recent Labs  06/08/16 0401 06/08/16 1508 06/09/16 0326  NA 138 139 138  138  K 3.7  3.9 3.6  3.6  CL 106 106 106  105  CO2 _0 GLUCOSE 144* 159* 142*  144*  BUN 37* 31* 24*  26*  CREATININE 2.60* 2.37* 2.18*  2.11*  CALCIUM 7.5* 7.8* 8.0*  8.0*  MG 2.4  --  2.4  PHOS 3.8 3.2 2.9   Liver Function Tests  Recent Labs  06/08/16 1508 06/09/16 0326  AST  --  50*  ALT  --  42  ALKPHOS  --  126  BILITOT  --  0.6  PROT  --  4.4*  ALBUMIN 1.8* 1.7*  1.7*   No results for input(s): LIPASE, AMYLASE in the last 72 hours. Cardiac Enzymes No results for input(s): CKTOTAL, CKMB, CKMBINDEX, TROPONINI in the last 72 hours.  BNP: BNP (last 3 results)  Recent Labs  05/31/16 1435  BNP 2,537.9*    ProBNP (last 3 results) No results for input(s): PROBNP in the last 8760 hours.   D-Dimer No results for input(s): DDIMER in the last 72 hours. Hemoglobin A1C No results for  input(s): HGBA1C in the last 72 hours. Fasting Lipid Panel No results for input(s): CHOL, HDL, LDLCALC, TRIG, CHOLHDL, LDLDIRECT in the last 72 hours. Thyroid Function Tests No results for input(s): TSH, T4TOTAL, T3FREE, THYROIDAB in the last 72 hours.  Invalid input(s): FREET3  Other results:     Imaging/Studies:  Dg Chest Port 1 View  Result Date: 06/09/2016 CLINICAL DATA:  60 year old male post CABG.  Subsequent encounter. EXAM: PORTABLE CHEST 1 VIEW COMPARISON:  06/08/2016 chest x-ray FINDINGS: Endotracheal tube tip 5.9 cm above the carina. Left central line tip left subclavian level. Nasogastric tube and Panda tube in place. Tip not imaged on the current exam. Right central line tip caval atrial junction/proximal right atrium without change. Mediastinal drain and left chest tube in place. Swan-Ganz catheter and right chest tube and been removed. No pneumothorax detected. Basilar atelectasis greater on the right without significant change. Cannot exclude small right-sided pleural effusion. Central pulmonary vascular prominence stable. Heart size top-normal post CABG. IMPRESSION: Right chest tube and Swan-Ganz catheter been removed. No pneumothorax detected. Remainder of findings without significant change as detailed above. Electronically Signed   By: Genia Del M.D.   On: 06/09/2016 08:29   Dg Chest Port 1 View  Result Date: 06/08/2016 CLINICAL DATA:  60 year old male with respiratory failure/ hypoxia post CABG. Subsequent encounter. EXAM: PORTABLE CHEST 1 VIEW COMPARISON:  06/07/2016 chest x-ray. FINDINGS: Endotracheal tube tip 6 cm above the carina. Left central line tip left subclavian vein level. Right Swan-Ganz catheter tip main pulmonary artery level. Right central line tip cavoatrial junction/ proximal right atrium level. Bilateral chest tubes in place. Nasogastric tube partially coiled within the gastric fundus. Tip not completely imaged on present exam. Post CABG.  Mild  cardiomegaly. Poor inspiration with bibasilar atelectatic changes. Pulmonary vascular congestion most notable centrally. No gross pneumothorax. IMPRESSION: Post CABG with various lines and catheters in place as detailed above without significant change. Bibasilar atelectasis. Pulmonary vascular prominence most notable centrally. Electronically Signed   By: Genia Del M.D.   On: 06/08/2016 07:39   Dg Chest Port 1 View  Result Date: 06/07/2016 CLINICAL DATA:  60 year old male with a history of endotracheal tube removal. Admission for cardiac surgery with recent CABG. EXAM: PORTABLE CHEST 1 VIEW COMPARISON:  06/07/2016 FINDINGS: Cardiomediastinal silhouette unchanged. Surgical changes of recent median sternotomy and CABG. Endotracheal tube position suitably above the carina approximately 5.5 cm. Unchanged left  IJ central venous catheter, right subclavian central venous catheter, right IJ sheath, through which a Swan-Ganz catheter appears to terminate in the main pulmonary artery. Unchanged gastric tube, terminating out of the field of view. Unchanged bilateral thoracostomy tubes and mediastinal drain. Epicardial pacing leads. Low lung volumes with minimal airspace opacities at the lung bases. No pneumothorax. IMPRESSION: Early surgical changes of median sternotomy and CABG, with unchanged support apparatus compared to the prior. Endotracheal tube terminates suitably above the carina, approximately 5.5 cm. Low lung volumes and atelectasis with no visualized pneumothorax. Signed, Dulcy Fanny. Earleen Newport, DO [facility] Electronically Signed   By: Corrie Mckusick D.O.   On: 06/07/2016 17:21   Dg Abd Portable 1v  Result Date: 06/08/2016 CLINICAL DATA:  60 year old male with a history of feeding tube placement EXAM: PORTABLE ABDOMEN - 1 VIEW COMPARISON:  06/01/2016, chest x-ray 06/08/2016 FINDINGS: Limited plain film of the abdomen. Weighted tip enteric feeding  tube terminates in the third portion the duodenum. Gastric tube terminates within the stomach. Epicardial pacing leads project over the upper abdomen and lower chest. Partial visualization of Swan-Ganz catheter at the lower chest. 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 Gas within stomach, and small bowel. IMPRESSION: Weighted tip enteric feeding tube terminates within the third portion of duodenum. Gastric tube terminates within the stomach. Epicardial pacing leads project over the upper abdomen, with surgical changes of median sternotomy and CABG. Swan-Ganz catheter partially visualized. Gas within stomach and small bowel. Signed, Dulcy Fanny. Earleen Newport, DO [facility] Electronically Signed   By: Corrie Mckusick D.O.   On: 06/08/2016 10:35    Latest Echo  Latest Cath   Medications:     Scheduled Medications: . acetaminophen  1,000 mg Oral Q6H   Or  . acetaminophen (TYLENOL) oral liquid 160 mg/5 mL  1,000 mg Per Tube Q6H  . aspirin EC  325 mg Oral Daily   Or  . aspirin  324 mg Per Tube Daily  . atorvastatin  80 mg Oral q1800  . bisacodyl  10 mg Oral Daily   Or  . bisacodyl  10 mg Rectal Daily  . chlorhexidine gluconate (MEDLINE KIT)  15 mL Mouth Rinse BID  . docusate sodium  200 mg Oral Daily  . ferumoxytol  510 mg Intravenous Weekly  . insulin aspart  0-15 Units Subcutaneous Q4H  . insulin detemir  10 Units Subcutaneous BID  . isosorbide dinitrate  5 mg Oral TID  . mouth rinse  15 mL Mouth Rinse 10 times per day  . pantoprazole sodium  40 mg Per Tube Daily  . sodium chloride flush  10-40 mL Intracatheter Q12H  . sodium chloride flush  3 mL Intravenous Q12H    Infusions: . sodium chloride 250 mL (06/09/16 0900)  . amiodarone 30 mg/hr (06/09/16 0900)  . dexmedetomidine 0.402 mcg/kg/hr (06/09/16 0900)  . feeding supplement (NEPRO CARB STEADY) Stopped (06/09/16 0830)  . milrinone 0.2 mcg/kg/min (06/09/16 0900)  .  nitroGLYCERIN Stopped (06/06/16 0430)  . phenylephrine (NEO-SYNEPHRINE) Adult infusion    . dialysis replacement fluid (prismasate) 400 mL/hr at 06/09/16 0533  . dialysis replacement fluid (prismasate) 200 mL/hr at 06/08/16 1650  . dialysate (PRISMASATE) 1,000 mL/hr at 06/09/16 0727    PRN Medications: heparin, Influenza vac split quadrivalent PF, labetalol, lactated ringers, midazolam, morphine injection, ondansetron (ZOFRAN) IV, oxyCODONE, sodium chloride, sodium chloride flush, sodium chloride flush, traMADol   Assessment/Plan   Kenneth [name]  is a 60 y.o. male with hx of poorly controlled diabetes, HTN, CKD IV, and HLD.  Admitted 05/31/16 with orthopnea, DOE, and CP.  Intubated, sedated, placed on IABP.  S/p CABG x 3 9/22 (LIMA-LAD, SVG-OM, SVG-RCA)  1. Acute on chronic systolic CHF with cardiogenic shock: EF 35-40% with wall motion abnormalities, ischemic cardiomyopathy.  IABP out. s/p CABG x 3 on 9/22.  - Continues CVVH for volume removal per renal at rate of 100 ml/hr out. - Coox 57% this am, continue milrinone 0.2 for now. Luiz Blare out 06/08/16 - Can add low dose hydralazine/isordil as we wean milrinone. (Pressures into 90s earlier today, but now into 120s ) 2. CAD: Admitted with NSTEMI, severe 3VD. S/p 3v CABG 9/22 - ASA, atorvastatin 80.  3. AKI on CKD stage IV:  On CVVHD now. Baseline creatinine seems to be around 2.5.   - CVVHD started 9/23. Weight now down 35 lbs by bed-weight since starting.    - Renal following.  4. Acute respiratory failure: right thoracentesis and left thoracentesis done.   - Failed wean 06/07/16 as went into Afib RVR - Plan on re-attempt wean today with BiPAP.  5. Anemia: Hgb stable.  6. ID: Remains afebrile with Serratia in sputum. CCM narrowed to ceftriaxone. Today is day 7/7 of antibiotics.  7. Atrial fibrillation: Paroxysmal.  - Was back in Afib am of 06/08/16 but converted with amio  - Continue amio gtt at 30 mg/hr for now.  - He will need to begin on  warfarin when CVTS deems stable for this.  - Weaning milrinone should decrease drive to go into fibrillation.   Length of Stay: [place], [place] 06/09/2016, 9:43 AM  Advanced Heart Failure Team Pager [phone] (M-F; 7a - 4p)  Please contact [facility] for night-coverage after hours (4p -7a ) and weekends on [email or web address]  Patient seen with PA, agree with the above note. Stable today, still removing fluid via CVVH.  CXR looks better and CVP coming down.  Possible extubation today.   Co-ox marginal today at 57%, will continue milrinone 0.2 without change today.   He continues on IV amiodarone for now, can switch to po soon, probably post-extubation when he is taking pills.   Loralie Champagne 06/09/2016 11:04 AM

## 2016-06-09 NOTE — Progress Notes (Signed)
Subjective: Interval History: dome weaning yest from vent, tol vol off.  Objective: Vital signs in last 24 hours: Temp:  [97 F (36.1 C)-98.4 F (36.9 C)] 97.3 F (36.3 C) (09/26 0717) Pulse Rate:  [60-89] 88 (09/26 0700) Resp:  [15-33] 16 (09/26 0700) BP: (73-182)/(45-70) 111/67 (09/26 0700) SpO2:  [97 %-100 %] 100 % (09/26 0700) FiO2 (%):  [40 %] 40 % (09/26 0400) Weight:  [67.5 kg (148 lb 13 oz)] 67.5 kg (148 lb 13 oz) (09/26 0500) Weight change: -7.7 kg (-16 lb 15.6 oz)  Intake/Output from previous day: 09/25 0701 - 09/26 0700 In: 2355.4 [I.V.:1491.4; NG/GT:697; IV Piggyback:167] Out: 1761 [Urine:186; Emesis/NG output:50; Chest Tube:360] Intake/Output this shift: No intake/output data recorded.  General appearance: sedated, mouths words Resp: rales bibasilar Chest wall: R [place] cath Cardio: S1, S2 normal and systolic murmur: holosystolic 2/6, blowing at apex GI: mod distension, pos bs, mediastinal tubes upper abdm Extremities: edema 3-4+ edema  Lab Results:  Recent Labs  06/08/16 0401 06/09/16 0326  WBC 14.6* 15.8*  HGB 10.9* 11.1*  HCT 33.5* 34.1*  PLT 88* 109*   BMET:  Recent Labs  06/08/16 1508 06/09/16 0326  NA 139 138  138  K 3.9 3.6  3.6  CL 106 106  105  CO2 28 25  25   GLUCOSE 159* 142*  144*  BUN 31* 24*  26*  CREATININE 2.37* 2.18*  2.11*  CALCIUM 7.8* 8.0*  8.0*   No results for input(s): PTH in the last 72 hours. Iron Studies:  Recent Labs  06/07/16 1230  IRON 20*  TIBC 147*    Studies/Results: Dg Chest Port 1 View  Result Date: 06/08/2016 CLINICAL DATA:  60 year old male with respiratory failure/ hypoxia post CABG. Subsequent encounter. EXAM: PORTABLE CHEST 1 VIEW COMPARISON:  06/07/2016 chest x-ray. FINDINGS: Endotracheal tube tip 6 cm above the carina. Left central line tip left subclavian vein level. Right Swan-Ganz catheter tip main pulmonary artery level. Right central line tip cavoatrial junction/ proximal right atrium  level. Bilateral chest tubes in place. Nasogastric tube partially coiled within the gastric fundus. Tip not completely imaged on present exam. Post CABG.  Mild cardiomegaly. Poor inspiration with bibasilar atelectatic changes. Pulmonary vascular congestion most notable centrally. No gross pneumothorax. IMPRESSION: Post CABG with various lines and catheters in place as detailed above without significant change. Bibasilar atelectasis. Pulmonary vascular prominence most notable centrally. Electronically Signed   By: Genia Del M.D.   On: 06/08/2016 07:39   Dg Chest Port 1 View  Result Date: 06/07/2016 CLINICAL DATA:  60 year old male with a history of endotracheal tube removal. Admission for cardiac surgery with recent CABG. EXAM: PORTABLE CHEST 1 VIEW COMPARISON:  06/07/2016 FINDINGS: Cardiomediastinal silhouette unchanged. Surgical changes of recent median sternotomy and CABG. Endotracheal tube position suitably above the carina approximately 5.5 cm. Unchanged left IJ central venous catheter, right subclavian central venous catheter, right IJ sheath, through which a Swan-Ganz catheter appears to terminate in the main pulmonary artery. Unchanged gastric tube, terminating out of the field of view. Unchanged bilateral thoracostomy tubes and mediastinal drain. Epicardial pacing leads. Low lung volumes with minimal airspace opacities at the lung bases. No pneumothorax. IMPRESSION: Early surgical changes of median sternotomy and CABG, with unchanged support apparatus compared to the prior. Endotracheal tube terminates suitably above the carina, approximately 5.5 cm. Low lung volumes and atelectasis with no visualized pneumothorax. Signed, Dulcy Fanny. Earleen Newport, DO [facility] Electronically Signed   By: York Cerise  Earleen Newport D.O.   On: 06/07/2016 17:21   Dg Chest Port 1 View  Result Date: 06/07/2016 CLINICAL DATA:  Status post CABG. Acute respiratory failure with  hypoxemia. EXAM: PORTABLE CHEST 1 VIEW COMPARISON:  06/06/2016. FINDINGS: Endotracheal tube in satisfactory position. Right jugular Swan-Ganz catheter tip in the main pulmonary artery segment. Intra aortic balloon pump tip in the proximal descending thoracic aorta. Mediastinal and bilateral chest tubes remain in place without pneumothorax. Stable right subclavian catheter and left jugular catheter. Stable post CABG changes. The cardiac silhouette remains mildly enlarged. Increased dense left lower lobe opacity. Mildly decreased right basilar atelectasis. No acute bony abnormality. IMPRESSION: 1. Progressive dense left lower lobe atelectasis or pneumonia. 2. Mildly improved right basilar atelectasis. Electronically Signed   By: Claudie Revering M.D.   On: 06/07/2016 09:36   Dg Abd Portable 1v  Result Date: 06/08/2016 CLINICAL DATA:  60 year old male with a history of feeding tube placement EXAM: PORTABLE ABDOMEN - 1 VIEW COMPARISON:  06/01/2016, chest x-ray 06/08/2016 FINDINGS: Limited plain film of the abdomen. Weighted tip enteric feeding tube terminates in the third portion the duodenum. Gastric tube terminates within the stomach. Epicardial pacing leads project over the upper abdomen and lower chest. Partial visualization of Swan-Ganz catheter at the lower chest. 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 Gas within stomach, and small bowel. IMPRESSION: Weighted tip enteric feeding tube terminates within the third portion of duodenum. Gastric tube terminates within the stomach. Epicardial pacing leads project over the upper abdomen, with surgical changes of median sternotomy and CABG. Swan-Ganz catheter partially visualized. Gas within stomach and small bowel. Signed, Dulcy Fanny. Earleen Newport, DO [facility] Electronically Signed   By: Corrie Mckusick D.O.   On: 06/08/2016 10:35    I have reviewed the patient's current  medications.  Assessment/Plan: 1 AKI oliguric ATN .  Solute/acid/base/K ok.  Removing vol , improving resp status with lower vol 2 Anemia stable 3 CAD per CVS 4 Resp failure improving, weaning 5 DM controlled 6 Nutrition on TF P CRRT, lower vol, wean, TF,      LOS: 9 days   Della Scrivener L 06/09/2016,7:30 AM

## 2016-06-10 ENCOUNTER — Inpatient Hospital Stay (HOSPITAL_COMMUNITY): Payer: BLUE CROSS/BLUE SHIELD

## 2016-06-10 LAB — CARBOXYHEMOGLOBIN
CARBOXYHEMOGLOBIN: 1 % (ref 0.5–1.5)
Methemoglobin: 1.2 % (ref 0.0–1.5)
O2 Saturation: 67.4 %
Total hemoglobin: 12.1 g/dL (ref 12.0–16.0)

## 2016-06-10 LAB — GLUCOSE, CAPILLARY
Glucose-Capillary: 112 mg/dL — ABNORMAL HIGH (ref 65–99)
Glucose-Capillary: 113 mg/dL — ABNORMAL HIGH (ref 65–99)
Glucose-Capillary: 122 mg/dL — ABNORMAL HIGH (ref 65–99)
Glucose-Capillary: 126 mg/dL — ABNORMAL HIGH (ref 65–99)
Glucose-Capillary: 134 mg/dL — ABNORMAL HIGH (ref 65–99)
Glucose-Capillary: 137 mg/dL — ABNORMAL HIGH (ref 65–99)

## 2016-06-10 LAB — PHOSPHORUS: Phosphorus: 2.8 mg/dL (ref 2.5–4.6)

## 2016-06-10 LAB — COMPREHENSIVE METABOLIC PANEL
ALT: 45 U/L (ref 17–63)
AST: 46 U/L — ABNORMAL HIGH (ref 15–41)
Albumin: 1.9 g/dL — ABNORMAL LOW (ref 3.5–5.0)
Alkaline Phosphatase: 143 U/L — ABNORMAL HIGH (ref 38–126)
Anion gap: 14 (ref 5–15)
BUN: 20 mg/dL (ref 6–20)
CO2: 23 mmol/L (ref 22–32)
Calcium: 8.3 mg/dL — ABNORMAL LOW (ref 8.9–10.3)
Chloride: 103 mmol/L (ref 101–111)
Creatinine, Ser: 2.29 mg/dL — ABNORMAL HIGH (ref 0.61–1.24)
GFR calc Af Amer: 34 mL/min — ABNORMAL LOW (ref >60)
GFR calc non Af Amer: 30 mL/min — ABNORMAL LOW (ref >60)
Glucose, Bld: 135 mg/dL — ABNORMAL HIGH (ref 65–99)
Potassium: 3.9 mmol/L (ref 3.5–5.1)
Sodium: 140 mmol/L (ref 135–145)
Total Bilirubin: 1.1 mg/dL (ref 0.3–1.2)
Total Protein: 5.2 g/dL — ABNORMAL LOW (ref 6.5–8.1)

## 2016-06-10 LAB — RENAL FUNCTION PANEL
ANION GAP: 6 (ref 5–15)
Albumin: 1.2 g/dL — ABNORMAL LOW (ref 3.5–5.0)
Albumin: 2 g/dL — ABNORMAL LOW (ref 3.5–5.0)
Anion gap: 11 (ref 5–15)
BUN: 14 mg/dL (ref 6–20)
BUN: 22 mg/dL — ABNORMAL HIGH (ref 6–20)
CHLORIDE: 123 mmol/L — AB (ref 101–111)
CO2: 15 mmol/L — AB (ref 22–32)
CO2: 23 mmol/L (ref 22–32)
Calcium: 5 mg/dL — CL (ref 8.9–10.3)
Calcium: 8.3 mg/dL — ABNORMAL LOW (ref 8.9–10.3)
Chloride: 104 mmol/L (ref 101–111)
Creatinine, Ser: 1.39 mg/dL — ABNORMAL HIGH (ref 0.61–1.24)
Creatinine, Ser: 2.31 mg/dL — ABNORMAL HIGH (ref 0.61–1.24)
GFR calc Af Amer: >60 mL/min (ref >60)
GFR calc Af Amer: 34 mL/min — ABNORMAL LOW (ref >60)
GFR calc non Af Amer: 54 mL/min — ABNORMAL LOW (ref >60)
GFR calc non Af Amer: 29 mL/min — ABNORMAL LOW (ref >60)
GLUCOSE: 87 mg/dL (ref 65–99)
Glucose, Bld: 124 mg/dL — ABNORMAL HIGH (ref 65–99)
POTASSIUM: 2.2 mmol/L — AB (ref 3.5–5.1)
Phosphorus: 1.5 mg/dL — ABNORMAL LOW (ref 2.5–4.6)
Phosphorus: 2.7 mg/dL (ref 2.5–4.6)
Potassium: 3.7 mmol/L (ref 3.5–5.1)
SODIUM: 144 mmol/L (ref 135–145)
Sodium: 138 mmol/L (ref 135–145)

## 2016-06-10 LAB — MAGNESIUM: Magnesium: 2.6 mg/dL — ABNORMAL HIGH (ref 1.7–2.4)

## 2016-06-10 LAB — CBC
HCT: 36.3 % — ABNORMAL LOW (ref 39.0–52.0)
Hemoglobin: 11.7 g/dL — ABNORMAL LOW (ref 13.0–17.0)
MCH: 28.9 pg (ref 26.0–34.0)
MCHC: 32.2 g/dL (ref 30.0–36.0)
MCV: 89.6 fL (ref 78.0–100.0)
Platelets: 181 10*3/uL (ref 150–400)
RBC: 4.05 MIL/uL — ABNORMAL LOW (ref 4.22–5.81)
RDW: 14.5 % (ref 11.5–15.5)
WBC: 17.2 10*3/uL — ABNORMAL HIGH (ref 4.0–10.5)

## 2016-06-10 MED ORDER — ISOSORBIDE MONONITRATE ER 30 MG PO TB24
15.0000 mg | ORAL_TABLET | Freq: Every day | ORAL | Status: DC
  Administered 2016-06-10 – 2016-06-18 (×8): 15 mg via ORAL
  Filled 2016-06-10 (×8): qty 1

## 2016-06-10 MED ORDER — METOPROLOL TARTRATE 5 MG/5ML IV SOLN
INTRAVENOUS | Status: AC
  Filled 2016-06-10: qty 5

## 2016-06-10 MED ORDER — HYDRALAZINE HCL 25 MG PO TABS
12.5000 mg | ORAL_TABLET | Freq: Three times a day (TID) | ORAL | Status: DC
  Administered 2016-06-10 – 2016-06-11 (×3): 12.5 mg via ORAL
  Filled 2016-06-10 (×4): qty 1

## 2016-06-10 MED ORDER — METOPROLOL TARTRATE 12.5 MG HALF TABLET
12.5000 mg | ORAL_TABLET | Freq: Two times a day (BID) | ORAL | Status: DC
  Administered 2016-06-10 – 2016-06-11 (×4): 12.5 mg via ORAL
  Filled 2016-06-10 (×4): qty 1

## 2016-06-10 MED ORDER — AMIODARONE HCL 200 MG PO TABS
200.0000 mg | ORAL_TABLET | Freq: Two times a day (BID) | ORAL | Status: DC
  Administered 2016-06-10 (×2): 200 mg via ORAL
  Filled 2016-06-10 (×2): qty 1

## 2016-06-10 MED ORDER — AMIODARONE IV BOLUS ONLY 150 MG/100ML
150.0000 mg | Freq: Once | INTRAVENOUS | Status: AC
  Administered 2016-06-10: 150 mg via INTRAVENOUS
  Filled 2016-06-10: qty 100

## 2016-06-10 MED ORDER — AMIODARONE HCL IN DEXTROSE 360-4.14 MG/200ML-% IV SOLN
INTRAVENOUS | Status: AC
  Filled 2016-06-10: qty 200

## 2016-06-10 NOTE — Progress Notes (Signed)
PULMONARY / CRITICAL CARE MEDICINE   Name: Kenneth [name] MRN: [id number] DOB: [birth year]    ADMISSION DATE:  05/31/2016 CONSULTATION DATE:  05/31/2016   REFERRING MD:  Dr. Dayna Barker, ER  CHIEF COMPLAINT:  Sob, resp failure  HISTORY OF PRESENT ILLNESS:   60 yo male male presented with dyspnea felt to be related to respiratory infection.  He had progressive orthopnea, chest discomfort, lower extremity edema, and respiratory failure from acute pulmonary edema with acute combined CHF.  SUBJECTIVE:  Extubated, stable.  VITAL SIGNS: BP 140/60 (BP Location: Right Arm)   Pulse 85   Temp 98.6 F (37 C) (Core (Comment))   Resp (!) 22   Ht 5\' 6"  (1.676 m)   Wt 145 lb 8.1 oz (66 kg)   SpO2 95%   BMI 23.48 kg/m   HEMODYNAMICS: CVP:  [3 mmHg-5 mmHg] 3 mmHg  VENTILATOR SETTINGS:    INTAKE / OUTPUT: I/O last 3 completed shifts: In: 1983.1 [I.V.:1558.1; NG/GT:375; IV Piggyback:50] Out: 9767 [Urine:290; Emesis/NG output:50; HALPF:7902; Chest Tube:376]  PHYSICAL EXAMINATION: General:  Awake, no distress Neuro: No focal deficits HENT: NCAT, moist mucus membranes CV: RRR, + systolic murmur PULM: Clear, no wheeze or crackles GI: soft, +BS, non tender, non distended MSK: normal bulk and tone. 1-2+ edema  LABS:  BMET  Recent Labs Lab 06/09/16 0326 06/09/16 1514 06/10/16 0352  NA 138  138 138 140  K 3.6  3.6 3.6 3.9  CL 106  105 106 103  CO2 25  25 25 23   BUN 24*  26* 21* 20  CREATININE 2.18*  2.11* 2.12* 2.29*  GLUCOSE 142*  144* 99 135*    Electrolytes  Recent Labs Lab 06/08/16 0401  06/09/16 0326 06/09/16 1514 06/10/16 0352  CALCIUM 7.5*  < > 8.0*  8.0* 8.1* 8.3*  MG 2.4  --  2.4  --  2.6*  PHOS 3.8  < > 2.9 2.5 2.8  < > = values in this interval not displayed.  CBC  Recent Labs Lab 06/08/16 0401 06/09/16 0326 06/10/16 0352  WBC 14.6* 15.8* 17.2*  HGB 10.9* 11.1* 11.7*  HCT 33.5* 34.1* 36.3*  PLT 88* 109* 181    Coag's  Recent Labs Lab  06/05/16 0344 06/06/16 0054  APTT 120* 39*  INR  --  1.47    Sepsis Markers  Recent Labs Lab 06/04/16 0453  PROCALCITON 1.13    ABG  Recent Labs Lab 06/09/16 0323 06/09/16 1118 06/09/16 1247  PHART 7.440 7.434 7.439  PCO2ART 38.2 31.6* 38.8  PO2ART 112.0* 117.0* 81.0*    Liver Enzymes  Recent Labs Lab 06/05/16 0344  06/09/16 0326 06/09/16 1514 06/10/16 0352  AST 29  --  50*  --  46*  ALT 35  --  42  --  45  ALKPHOS 112  --  126  --  143*  BILITOT 0.5  --  0.6  --  1.1  ALBUMIN 1.7*  < > 1.7*  1.7* 1.9* 1.9*  < > = values in this interval not displayed.  Cardiac Enzymes No results for input(s): TROPONINI, PROBNP in the last 168 hours.  Glucose  Recent Labs Lab 06/09/16 1123 06/09/16 1519 06/09/16 1917 06/09/16 2346 06/10/16 0343 06/10/16 0739  GLUCAP 116* 86 101* 109* 113* 137*    Imaging Dg Chest Port 1 View  Result Date: 06/10/2016 CLINICAL DATA:  Coronary artery disease, status post coronary artery bypass grafting. EXAM: PORTABLE CHEST 1 VIEW COMPARISON:  June 09, 2016 FINDINGS: Endotracheal tube  and nasogastric tube have been removed in. Left jugular catheter tip is in the left innominate vein, stable. Right-sided central catheter tip is in the superior vena cava. There is a chest tube on the left. No pneumothorax evident. There is a fairly small pleural effusion on the right. There is patchy bibasilar atelectasis. There is no airspace consolidation. Heart is mildly enlarged with pulmonary vascularity within normal limits. No adenopathy. There is atherosclerotic calcification in the aorta. IMPRESSION: Tube and catheter positions as described without pneumothorax. Small right pleural effusion with patchy bibasilar atelectasis. Stable cardiac prominence. Aortic atherosclerosis. Electronically Signed   By: Lowella Grip III M.D.   On: 06/10/2016 07:35   STUDIES:  Echo 9/17 >> EF 35 to 40%, grade 2 DD [facility] 9/17 >> severe 3 vessel disease Rt  thoracentesis 9/19 >> glucose 168, protein < 3, LDH 61, WBC 117  CULTURES: Blood 9/19 >> (-) Sputum 9/19 >> moderate  Serratia marcescens Rt pleural fluid 9/19 >> (-)  ANTIBIOTICS: Vancomycin 9/19 >> 9/22 Cefuroxime 9/22 > 9/24 Zosyn 9/19 >>9/22 Ceftriaxone 9/24 >   SIGNIFICANT EVENTS: 9/17 from [facility] to [facility], [place], Cardiology consulted, IABP 9/18 TCTS consulted, a fib with RVR >> amiodarone bolus 9/22 CABG  LINES/TUBES: 9/17 ETT >>  9/17 IABP >> 9/17 Rt IJ Introducer >>   9/22 R subclavian HD cath> 9/22 Chest tube/mediastinal tubes >>  ASSESSMENT / PLAN:  PULMONARY A: Acute hypoxic respiratory failure 2nd to acute pulmonary edema, pleural effusions, pneumonia >> minimal vent settings  P:   Stable post extubation Wean down O2. Incentive spirometer.   CARDIOVASCULAR A:  Cardiogenic shock, improved.  Acute combined CHF, improved.  3 vessel CAD> s/p CABG 9/22 HTN A fib  P:  Post op care per TCTS Off Vasopressors. Continues on milrinone Amio transitioned to oral.  RENAL A:   AKI, oliguric, ATN on top of CKD stIV CKD 4 >> baseline creatinine 2.57 from 05/21/16 P:   Continue CRRT per renal Monitor BMET and UOP Replace electrolytes as needed  GASTROINTESTINAL A:   Nutrition P:   NPO for now per speech, swallow. Reval tomorrow.  Protonix for SUP  HEMATOLOGIC A:   Anemia of critical illness, chronic disease Thrombocytopenia likely 2/2 meds, ckd P:  Monitor for bleeding Heparin SQ  INFECTIOUS A:   Pneumonia with Serratia in sputum cx P:   Cont Rocephin for now. Serratia was resistant to Cefazolin. Plan for 7 days (stop date: 9/30)   ENDOCRINE A:   DM P:   Cont insulin SS  NEUROLOGIC A:   Acute metabolic encephalopathy P:   Resolved Minimize sedation.  Marshell Garfinkel MD [facility] Pager [phone] If no answer or after 3pm call: [phone] 06/10/2016, 10:26 AM

## 2016-06-10 NOTE — Progress Notes (Signed)
Patient ID: Kenneth [name], male   DOB: 23-Dec-1955, 60 y.o.   MRN: [id number]     Advanced Heart Failure Rounding Note  Referring Physician: Raylene Miyamoto, MD  Primary Physician: Rodena Medin, MD Primary HF: New ( Dr Aundra Dubin)  Reason for Consultation: Acute respiratory failure  Subjective:    Kenneth [name] is a 60 y.o. male with hx of poorly controlled diabetes, HTN, CKD IV, and HLD.  Admitted 05/31/16 with orthopnea, DOE, and CP. EKG did not meet stemi criteria. Troponin 1.4. Echo showed global LV dysfunction. Started on NIMV for acute respiratory failure, failed requiring intubation.   Echo 05/31/16 LVEF 35-40%, Grade 2 DD  LHC 05/31/16 showed LM lesion, 40 %stenosed, Ramus lesion, 80 %stenosed, Ost Cx to Prox Cx lesion, 80 %stenosed, Mid LAD lesion, 90 %stenosed, Ost 2nd Diag to 2nd Diag lesion, 90 %stenosed, Prox RCA to Mid RCA lesion, 100 %stenosed. IABP inserted.   Atrial fibrillation with RVR developed 9/18.  Thoracentesis on right on 9/19,  transudative (1000 cc off).   Thoracentesis on left on 9/21, 600 cc off.   Underwent CABG x 3. 9/22  IABP removed 06/07/16. Extubated 06/09/16. Coox 67.4% on Milrinone 0.2 mcg/kg/min. CVP 2-3  Says breathing is better. Thanks [place] for everything we have done.   Remains on CVVHD. Out 2.9 L yesterday down another 3 lbs.   Objective:   Weight Range: 145 lb 8.1 oz (66 kg) Body mass index is 23.48 kg/m.   Vital Signs:   Temp:  [98.2 F (36.8 C)-99 F (37.2 C)] 98.6 F (37 C) (09/27 0400) Pulse Rate:  [81-121] 85 (09/27 1000) Resp:  [10-31] 22 (09/27 1000) BP: (114-156)/(56-115) 140/60 (09/27 1000) SpO2:  [93 %-100 %] 95 % (09/27 1000) Weight:  [145 lb 8.1 oz (66 kg)] 145 lb 8.1 oz (66 kg) (09/27 0500) Last BM Date: 06/09/16  Weight change: Filed Weights   06/08/16 0600 06/09/16 0500 06/10/16 0500  Weight: 165 lb 12.6 oz (75.2 kg) 148 lb 13 oz (67.5 kg) 145 lb 8.1 oz (66 kg)    Intake/Output:   Intake/Output Summary (Last 24  hours) at 06/10/16 1032 Last data filed at 06/10/16 1000  Gross per 24 hour  Intake           865.66 ml  Output             4351 ml  Net         -3485.34 ml     Physical Exam: CVP 2-3 General:  Extubated. Stable. Fatigued appearing HEENT: Normal Neck: supple. JVP flat. Carotids 2+ bilat; Cor: Sternal dressing RRR. No M/G/R.  Lungs: Diminished throughout   Abdomen: MIldly distended. Extremities: no cyanosis, clubbing, rash. Trace ankle edema. R groin IABP stable.  Neuro: alert & orientedx3, cranial nerves grossly intact. moves all 4 extremities w/o difficulty. Affect pleasant  Telemetry: Reviewed, A paced 80s this am.  Had Afib again overnight but converted to NSR shortly after midnight. None since.   Labs: CBC  Recent Labs  06/09/16 0326 06/10/16 0352  WBC 15.8* 17.2*  HGB 11.1* 11.7*  HCT 34.1* 36.3*  MCV 88.8 89.6  PLT 109* 378   Basic Metabolic Panel  Recent Labs  06/09/16 0326 06/09/16 1514 06/10/16 0352  NA 138  138 138 140  K 3.6  3.6 3.6 3.9  CL 106  105 106 103  CO2 25  25 25 23   GLUCOSE 142*  144* 99 135*  BUN 24*  26* 21* 20  CREATININE 2.18*  2.11* 2.12* 2.29*  CALCIUM 8.0*  8.0* 8.1* 8.3*  MG 2.4  --  2.6*  PHOS 2.9 2.5 2.8   Liver Function Tests  Recent Labs  06/09/16 0326 06/09/16 1514 06/10/16 0352  AST 50*  --  46*  ALT 42  --  45  ALKPHOS 126  --  143*  BILITOT 0.6  --  1.1  PROT 4.4*  --  5.2*  ALBUMIN 1.7*  1.7* 1.9* 1.9*   No results for input(s): LIPASE, AMYLASE in the last 72 hours. Cardiac Enzymes No results for input(s): CKTOTAL, CKMB, CKMBINDEX, TROPONINI in the last 72 hours.  BNP: BNP (last 3 results)  Recent Labs  05/31/16 1435  BNP 2,537.9*    ProBNP (last 3 results) No results for input(s): PROBNP in the last 8760 hours.   D-Dimer No results for input(s): DDIMER in the last 72 hours. Hemoglobin A1C No results for input(s): HGBA1C in the last 72 hours. Fasting Lipid Panel No results for  input(s): CHOL, HDL, LDLCALC, TRIG, CHOLHDL, LDLDIRECT in the last 72 hours. Thyroid Function Tests No results for input(s): TSH, T4TOTAL, T3FREE, THYROIDAB in the last 72 hours.  Invalid input(s): FREET3  Other results:     Imaging/Studies:  Dg Chest Port 1 View  Result Date: 06/10/2016 CLINICAL DATA:  Coronary artery disease, status post coronary artery bypass grafting. EXAM: PORTABLE CHEST 1 VIEW COMPARISON:  June 09, 2016 FINDINGS: Endotracheal tube and nasogastric tube have been removed in. Left jugular catheter tip is in the left innominate vein, stable. Right-sided central catheter tip is in the superior vena cava. There is a chest tube on the left. No pneumothorax evident. There is a fairly small pleural effusion on the right. There is patchy bibasilar atelectasis. There is no airspace consolidation. Heart is mildly enlarged with pulmonary vascularity within normal limits. No adenopathy. There is atherosclerotic calcification in the aorta. IMPRESSION: Tube and catheter positions as described without pneumothorax. Small right pleural effusion with patchy bibasilar atelectasis. Stable cardiac prominence. Aortic atherosclerosis. Electronically Signed   By: Lowella Grip III M.D.   On: 06/10/2016 07:35   Dg Chest Port 1 View  Result Date: 06/09/2016 CLINICAL DATA:  60 year old male post CABG.  Subsequent encounter. EXAM: PORTABLE CHEST 1 VIEW COMPARISON:  06/08/2016 chest x-ray FINDINGS: Endotracheal tube tip 5.9 cm above the carina. Left central line tip left subclavian level. Nasogastric tube and Panda tube in place. Tip not imaged on the current exam. Right central line tip caval atrial junction/proximal right atrium without change. Mediastinal drain and left chest tube in place. Swan-Ganz catheter and right chest tube and been removed. No pneumothorax detected. Basilar atelectasis greater on the right without significant change. Cannot exclude small right-sided pleural effusion.  Central pulmonary vascular prominence stable. Heart size top-normal post CABG. IMPRESSION: Right chest tube and Swan-Ganz catheter been removed. No pneumothorax detected. Remainder of findings without significant change as detailed above. Electronically Signed   By: Genia Del M.D.   On: 06/09/2016 08:29    Latest Echo  Latest Cath   Medications:     Scheduled Medications: . acetaminophen  1,000 mg Oral Q6H   Or  . acetaminophen (TYLENOL) oral liquid 160 mg/5 mL  1,000 mg Per Tube Q6H  . amiodarone  200 mg Oral BID  . aspirin EC  325 mg Oral Daily   Or  . aspirin  324 mg Per Tube Daily  . atorvastatin  80 mg Oral q1800  . bisacodyl  10 mg Oral Daily   Or  . bisacodyl  10 mg Rectal Daily  . chlorhexidine  15 mL Mouth Rinse BID  . docusate sodium  200 mg Oral Daily  . ferumoxytol  510 mg Intravenous Weekly  . heparin subcutaneous  5,000 Units Subcutaneous Q12H  . insulin aspart  0-15 Units Subcutaneous Q4H  . insulin detemir  6 Units Subcutaneous BID  . isosorbide dinitrate  5 mg Oral TID  . mouth rinse  15 mL Mouth Rinse q12n4p  . metoprolol tartrate  12.5 mg Oral BID  . pantoprazole  40 mg Oral Q1200  . sodium chloride flush  10-40 mL Intracatheter Q12H  . sodium chloride flush  3 mL Intravenous Q12H    Infusions: . sodium chloride 250 mL (06/10/16 0154)  . milrinone 0.2 mcg/kg/min (06/09/16 2332)  . dialysis replacement fluid (prismasate) 400 mL/hr at 06/10/16 0721  . dialysis replacement fluid (prismasate) 200 mL/hr at 06/09/16 1931  . dialysate (PRISMASATE) 1,000 mL/hr at 06/10/16 0939    PRN Medications: heparin, Influenza vac split quadrivalent PF, labetalol, morphine injection, ondansetron (ZOFRAN) IV, oxyCODONE, sodium chloride, sodium chloride flush, sodium chloride flush, traMADol   Assessment/Plan   Kenneth [name] is a 60 y.o. male with hx of poorly controlled diabetes, HTN, CKD IV, and HLD.  Admitted 05/31/16 with orthopnea, DOE, and CP.  Intubated,  sedated, placed on IABP.  S/p CABG x 3 9/22 (LIMA-LAD, SVG-OM, SVG-RCA)  1. Acute on chronic systolic CHF with cardiogenic shock: EF 35-40% with wall motion abnormalities, ischemic cardiomyopathy.  IABP out. s/p CABG x 3 on 9/22.  - Continues CVVH for volume removal per renal at rate of 100 ml/hr out. - Coox 67.4% this am, Decrease milrinone to 0.1 mcg/kg/min and recheck this afternoon.  - Will consider adding low dose hydral/imdur while weaning milrinone and pressures up into 140s.  2. CAD: Admitted with NSTEMI, severe 3VD. S/p 3v CABG 9/22 - ASA, atorvastatin 80.  3. AKI on CKD stage IV:  On CVVHD now. Baseline creatinine seems to be around 2.5.   - CVVHD started 9/23. Weight now down 38 lbs by bed-weight since starting.    - Renal following. Would suggest lowering CVVHD rate with CVP in 2-3 range 4. Acute respiratory failure: right thoracentesis and left thoracentesis done.   - Failed wean 06/07/16 as went into Afib RVR. Extubated 4/53/64 without complication.  5. Anemia:  - Hgb stable.  6. ID: Remains afebrile with Serratia in sputum. CCM narrowed to ceftriaxone. Today is day 7/7 of antibiotics.  7. Atrial fibrillation: Paroxysmal.  - Was back in Afib am of 06/08/16 but converted with amio  - Remains on amio gtt. Received dose of po amio and plan to stop gtt this afternoon.  - He will need to begin on warfarin when CVTS deems stable for this.  - Weaning milrinone as above should decrease drive to go into fibrillation.   Length of Stay: [address]  Annamaria Helling 06/10/2016, 10:32 AM  Advanced Heart Failure Team Pager [phone] (M-F; 7a - 4p)  Please contact [facility] for night-coverage after hours (4p -7a ) and weekends on [email or web address]  Patient seen with PA, agree with the above note.  He is doing well today, BP stable, extubated.  Remains on CVVH.  CVP now low, suspect we can back off on UF rate.    Can add low dose hydralazine/nitrates as we wean milrinone, decrease  milrinone to 0.1 today with good co-ox, hopefully off tomorrow.  Transition to po amiodarone, remains in NSR.    Loralie Champagne 06/10/2016 12:55 PM

## 2016-06-10 NOTE — Progress Notes (Signed)
5 Days Post-Op Procedure(s) (LRB): CORONARY ARTERY BYPASS GRAFTING (CABG) x3 LIMA to LAD -SVG to OM -SVG to RCA (N/A) INTRAOPERATIVE TRANSESOPHAGEAL ECHOCARDIOGRAM (N/A) INSERTION OF DIALYSIS/trialysis CATHETER (N/A) Subjective: Doing well extubated Acute on chronic renal failure on CVVH Co-ox > 60 off pressors Will get OOB to chair with PT preop serratia pneumonia on Abs Objective: Vital signs in last 24 hours: Temp:  [97.3 F (36.3 C)-99 F (37.2 C)] 98.6 F (37 C) (09/27 0400) Pulse Rate:  [81-121] 83 (09/27 0800) Cardiac Rhythm: Normal sinus rhythm (09/27 0800) Resp:  [10-31] 24 (09/27 0800) BP: (114-156)/(56-115) 138/65 (09/27 0800) SpO2:  [93 %-100 %] 96 % (09/27 0800) FiO2 (%):  [40 %] 40 % (09/26 1000) Weight:  [145 lb 8.1 oz (66 kg)] 145 lb 8.1 oz (66 kg) (09/27 0500)  Hemodynamic parameters for last 24 hours: CVP:  [3 mmHg-7 mmHg] 3 mmHg  Intake/Output from previous day: 09/26 0701 - 09/27 0700 In: 988.6 [I.V.:903.6; NG/GT:35; IV Piggyback:50] Out: 61 [Urine:210; Emesis/NG output:50; Chest Tube:200] Intake/Output this shift: Total I/O In: 35.2 [I.V.:35.2] Out: 210 [Urine:5; Other:175; Chest Tube:30]       Exam    General- alert and comfortable   Lungs- clear without rales, wheezes   Cor- regular rate and rhythm, no murmur , gallop   Abdomen- soft, non-tender   Extremities - warm, non-tender, minimal edema   Neuro- oriented, appropriate, no focal weakness   Lab Results:  Recent Labs  06/09/16 0326 06/10/16 0352  WBC 15.8* 17.2*  HGB 11.1* 11.7*  HCT 34.1* 36.3*  PLT 109* 181   BMET:  Recent Labs  06/09/16 1514 06/10/16 0352  NA 138 140  K 3.6 3.9  CL 106 103  CO2 25 23  GLUCOSE 99 135*  BUN 21* 20  CREATININE 2.12* 2.29*  CALCIUM 8.1* 8.3*    PT/INR: No results for input(s): LABPROT, INR in the last 72 hours. ABG    Component Value Date/Time   PHART 7.439 06/09/2016 1247   HCO3 26.3 06/09/2016 1247   TCO2 27 06/09/2016 1247   ACIDBASEDEF 3.0 (H) 06/09/2016 1118   O2SAT 67.4 06/10/2016 0342   CBG (last 3)   Recent Labs  06/09/16 2346 06/10/16 0343 06/10/16 0739  GLUCAP 109* 113* 137*    Assessment/Plan: S/P Procedure(s) (LRB): CORONARY ARTERY BYPASS GRAFTING (CABG) x3 LIMA to LAD -SVG to OM -SVG to RCA (N/A) INTRAOPERATIVE TRANSESOPHAGEAL ECHOCARDIOGRAM (N/A) INSERTION OF DIALYSIS/trialysis CATHETER (N/A) mobilize Start diet after SLT eval CVVH per renal- starting to make some urine  LOS: 10 days    Kenneth [name] 06/10/2016

## 2016-06-10 NOTE — Progress Notes (Signed)
Critical labs called; sample redrawn; no clinical findings for labs; will await new results.  Kenneth [name]

## 2016-06-10 NOTE — Progress Notes (Signed)
[facility] BRIEF SICU PROGRESS NOTE  5 Days Post-Op  S/P Procedure(s) (LRB): CORONARY ARTERY BYPASS GRAFTING (CABG) x3 LIMA to LAD -SVG to OM -SVG to RCA (N/A) INTRAOPERATIVE TRANSESOPHAGEAL ECHOCARDIOGRAM (N/A) INSERTION OF DIALYSIS/trialysis CATHETER (N/A)   Stable day although now back in Afib w/ HR 100-110 BP stable  Breathing comfortably Tolerating CVVHD  Plan: Extra 150 mg Amiodarone  Rexene Alberts, MD 06/10/2016 5:47 PM

## 2016-06-10 NOTE — Progress Notes (Signed)
Pt back in a-fib HR 110's; Dr. Roxy Manns at bedside; new orders for IV Amio bolus only; will administer; will cont. To monitor.  Ruben Reason

## 2016-06-10 NOTE — Op Note (Signed)
Kenneth [name], Kenneth [name]                   ACCOUNT NO.:  [id number]  MEDICAL RECORD NO.:  [id number]  LOCATION:  2S14C                        FACILITY:  [facility]  PHYSICIAN:  Ivin Poot, M.D.  DATE OF BIRTH:  1955-12-14  DATE OF PROCEDURE:  06/05/2016 DATE OF DISCHARGE:                              OPERATIVE REPORT   OPERATION: 1. Coronary artery bypass grafting x3 (left internal mammary artery to     LAD, saphenous vein graft to right coronary artery, saphenous vein     graft to obtuse marginal). 2. Endoscopic harvest of left leg greater saphenous vein. 3. Placement of percutaneous hemodialysis catheter via right     subclavian vein.  PREOPERATIVE DIAGNOSES:  Ischemic cardiomyopathy, acute myocardial infarction, cardiogenic shock with preoperative balloon pump, ventilator, and acute on chronic renal failure.  SURGEON:  Ivin Poot, M.D.  ASSISTANT:  Providence Crosby, PA-C.  ANESTHESIA:  General.  INDICATIONS:  The patient is a 60 year old [name], who was admitted through the emergency department and transferred from outside hospital and was admitted with acute shortness of breath from acute MI, pulmonary edema, and hypotension.  He undergo emergent catheterization at this hospital, which demonstrated severe 3-vessel coronary artery disease. No acute vessel occlusion.  His LVEF was depressed at 25% and a balloon pump was placed in the cath lab.  The patient has been previously intubated.  Cardiothoracic surgical evaluation was requested.  Surgical coronary revascularization was recommended.  I examined the patient in the CCU and reviewed the results of his echo and coronary angiograms with the patient and his son, Quillian Quince.  I discussed the indications and expected benefits of CABG for treatment of his CAD.  Also, discussed placement of a dialysis catheter as the patient's baseline creatinine of 2.5 had steadily increased to over 3.0.  After the patient's medical condition  was optimized and his cardiogenic shock had been treated with Milrinone and balloon pump, the patient was scheduled for surgery. Informed consent was obtained from the son, who understood the risks of death, stroke, prolonged ventilator dependence, prolonged hemodialysis dependence, infection, and death.  OPERATIVE PROCEDURE:  The patient was brought directly from the CCU to the OR and placed supine on operating table.  The patient already had a pulmonary artery catheter and A line placed.  The chest, abdomen, and legs were prepped with Betadine and draped as a sterile field.  A proper time-out was performed.  A sternal incision was made as the saphenous vein was harvested endoscopically from the left leg.  The internal mammary artery was harvested as a pedicle graft from its origin at the subclavian vessels.  This was 1.4-mm vessel with good flow.  The sternal retractor was placed and the pericardium was opened and suspended. Pursestrings were placed in the ascending aorta and right atrium.  When the vein had been harvested, the patient was heparinized and the ACT was documented as being therapeutic for bypass.  The patient was then cannulated and placed on cardiopulmonary bypass.  The coronaries were identified for grafting.  The right coronary was nondominant, but graftable.  The LAD and the circumflex marginal both had greater than 95% stenosis.  The  mammary artery and vein grafts were prepared for the distal anastomoses and cardioplegia cannulas were antegrade and retrograde cold blood cardioplegia were placed.  The patient was cooled to 32 degrees and an aortic crossclamp was applied.  A liter of cold blood cardioplegia was delivered in split doses between the antegrade aortic and retrograde coronary sinus catheters.  There was good cardioplegic arrest, and septal temperature dropped less than 12 degrees.  Cardioplegia was delivered every 20 minutes or less.  The distal coronary  anastomoses were performed.  The first distal anastomosis was to the RCA.  This was a small 1.2-mm vessel with proximal occlusion that was chronic.  A reverse saphenous vein was sewn end-to-side and there was adequate flow through the graft.  Cardioplegia was redosed.  The second distal anastomosis was the OM branch of the left circumflex. This was a large 1.7 mm vessel with proximal 95% stenosis.  A reverse saphenous vein was sewn end-to-side with running 7-0 Prolene and there was excellent flow through the graft.  Cardioplegia was redosed.  The third distal anastomosis was the distal LAD.  More proximally, there was a 95% stenosis.  The left IMA pedicle was brought through an opening and the left lateral pericardium was brought down onto the LAD and sewn end-to-side with running 8-0 Prolene.  There was good flow through the anastomosis after briefly removing the pedicle bulldog from the mammary pedicle.  The bulldog was reapplied and the pedicle was secured to the epicardium.  Cardioplegia was redosed.  While the crossclamp was still in place, 2 proximal vein anastomoses were performed on the ascending aorta using a 4.5 mm punch with running 6-0 Prolene.  The air was vented from the coronaries and the left side of the heart using a dose of retrograde warm blood cardioplegia and the usual de-airing maneuvers on bypass.  The crossclamp was removed.  The heart resumed a spontaneous rhythm.  The vein grafts were de-aired and opened.  Each had good flow and hemostasis was documented the proximal and distal sites.  The cardioplegia cannulas were removed.  The patient was rewarmed and reperfused.  Temporary pacing wires were applied.  The lungs re-expanded and the ventilator was resumed.  The balloon pump was re-initiated as we weaned from cardiopulmonary bypass without difficulty.  Echo showed improved global LV function. Hemodynamics were stable.  Low-dose inotropes were required.   Protamine was administered without adverse reaction.  The cannulas were removed. The mediastinum was irrigated with warm saline.  The superior pericardial fat was closed over the aorta.  Anterior mediastinal and posterior mediastinal drains were placed as well as left pleural drain. The patient remained stable.  The sternum was closed with interrupted steel wire.  The pectoralis fascia and subcutaneous layers were closed in running Vicryl.  The skin was closed with a subcuticular.  The temporary hemodialysis catheter was then placed via the right subclavian vein using a Seldinger technique.  Over the guidewire, the Trialysis catheter was inserted and had excellent blood return and was flushed.  It was secured to the skin and sterile dressings were placed for the sternal incision as well as the dialysis catheter.  A follow up chest x-ray in the operating showed the catheter to be in good position.  Total cardiopulmonary bypass time was 120 minutes.     Ivin Poot, M.D.     PV/MEDQ  D:  06/09/2016  T:  06/10/2016  Job:  [id number]

## 2016-06-10 NOTE — Progress Notes (Signed)
RN states ABG not needed this AM.

## 2016-06-10 NOTE — Evaluation (Signed)
Clinical/Bedside Swallow Evaluation Patient Details  Name: Kenneth [name] MRN: [id number] Date of Birth: 12/03/1955  Today's Date: 06/10/2016 Time: SLP Start Time (ACUTE ONLY): 0913 SLP Stop Time (ACUTE ONLY): 0929 SLP Time Calculation (min) (ACUTE ONLY): 16 min  Past Medical History: History reviewed. No pertinent past medical history. Past Surgical History:  Past Surgical History:  Procedure Laterality Date  . CARDIAC CATHETERIZATION N/A 05/31/2016   Procedure: Left Heart Cath and Coronary Angiography;  Surgeon: Jettie Booze, MD;  Location: [facility];  Service: Cardiovascular;  Laterality: N/A;  . CARDIAC CATHETERIZATION N/A 05/31/2016   Procedure: Right Heart Cath;  Surgeon: Jettie Booze, MD;  Location: [facility];  Service: Cardiovascular;  Laterality: N/A;  . CARDIAC CATHETERIZATION N/A 05/31/2016   Procedure: IABP Insertion;  Surgeon: Jettie Booze, MD;  Location: [facility];  Service: Cardiovascular;  Laterality: N/A;  . CORONARY ARTERY BYPASS GRAFT N/A 06/05/2016   Procedure: CORONARY ARTERY BYPASS GRAFTING (CABG) x3 LIMA to LAD -SVG to OM -SVG to RCA;  Surgeon: Ivin Poot, MD;  Location: [facility];  Service: Open Heart Surgery;  Laterality: N/A;  . INSERTION OF DIALYSIS CATHETER N/A 06/05/2016   Procedure: INSERTION OF DIALYSIS/trialysis CATHETER;  Surgeon: Ivin Poot, MD;  Location: [facility];  Service: Vascular;  Laterality: N/A;  . INTRAOPERATIVE TRANSESOPHAGEAL ECHOCARDIOGRAM N/A 06/05/2016   Procedure: INTRAOPERATIVE TRANSESOPHAGEAL ECHOCARDIOGRAM;  Surgeon: Ivin Poot, MD;  Location: [place];  Service: Open Heart Surgery;  Laterality: N/A;   HPI:  Pt is 60 y.o.male with hx of poorly controlled diabetes, HTN, CKD IV, and HLD. Admitted 05/31/16 with orthopnea, DOE, and CP. EKG did not meet stemi criteria. Troponin 1.4. Echo showed global LV dysfunction. Started on NIMV for acute respiratory failure, failed requiring intubation on 9/17  and was extubated on 9/26.   Assessment / Plan / Recommendation Clinical Impression  Pt seen post 8 day intubation and presents with a weak cough and low vocal quality. He consumed trials of ice chips, thin liquid, and pureed solids. Multiple swallows and facial grimacing were displayed for all given consistencies. Following a tsp of thin liquid pt had immediate throat clearing. When PO trials were finished pt had delayed coughing. Recommend pt remain NPO given recent intubation and observed s/s of aspiration at bedside. Good prognosis for PO readiness given additional time. Per RN, MD wants PO medications given. If MD wishes for pt to consume meds by mouth, consider giving in minimal amounts of puree.Would hold if any coughing (immediate, or delayed) is noted.    Aspiration Risk  Moderate aspiration risk    Diet Recommendation NPO   Medication Administration: Via alternative means    Other  Recommendations Oral Care Recommendations: Oral care QID   Follow up Recommendations 24 hour supervision/assistance      Frequency and Duration min 2x/week  2 weeks       Prognosis Prognosis for Safe Diet Advancement: Good      Swallow Study   General HPI: Pt is 60 y.o.male with hx of poorly controlled diabetes, HTN, CKD IV, and HLD. Admitted 05/31/16 with orthopnea, DOE, and CP. EKG did not meet stemi criteria. Troponin 1.4. Echo showed global LV dysfunction. Started on NIMV for acute respiratory failure, failed requiring intubation on 9/17 and was extubated on 9/26. Type of Study: Bedside Swallow Evaluation Diet Prior to this Study: NPO Temperature Spikes Noted: No Respiratory Status: Nasal cannula History of Recent Intubation: Yes Length of Intubations (  days): 8 days Date extubated: 06/09/16 Behavior/Cognition: Cooperative;Lethargic/Drowsy;Requires cueing Oral Cavity Assessment: Within Functional Limits Oral Care Completed by SLP: No Oral Cavity - Dentition: Adequate natural  dentition Self-Feeding Abilities: Total assist Patient Positioning: Upright in bed Baseline Vocal Quality: Low vocal intensity Volitional Cough: Weak Volitional Swallow: Unable to elicit    Oral/Motor/Sensory Function Overall Oral Motor/Sensory Function: Within functional limits   Ice Chips Ice chips: Impaired Presentation: Spoon Pharyngeal Phase Impairments: Multiple swallows;Cough - Delayed   Thin Liquid Thin Liquid: Impaired Presentation: Spoon Pharyngeal  Phase Impairments: Multiple swallows;Throat Clearing - Immediate;Cough - Delayed    Nectar Thick Nectar Thick Liquid: Not tested   Honey Thick Honey Thick Liquid: Not tested   Puree Puree: Impaired Presentation: Spoon Pharyngeal Phase Impairments: Cough - Delayed;Multiple swallows   Solid   GO   Solid: Not tested       Ezekiel Slocumb, Student SLP  Shela Leff 06/10/2016,10:42 AM

## 2016-06-10 NOTE — Progress Notes (Signed)
Subjective: Interval History: has complaints sore, cough.  Objective: Vital signs in last 24 hours: Temp:  [97.3 F (36.3 C)-99 F (37.2 C)] 98.6 F (37 C) (09/27 0400) Pulse Rate:  [75-121] 84 (09/27 0600) Resp:  [10-31] 22 (09/27 0600) BP: (101-149)/(56-115) 146/66 (09/27 0600) SpO2:  [93 %-100 %] 95 % (09/27 0600) FiO2 (%):  [40 %] 40 % (09/26 1000) Weight:  [66 kg (145 lb 8.1 oz)] 66 kg (145 lb 8.1 oz) (09/27 0500) Weight change: -1.5 kg (-3 lb 4.9 oz)  Intake/Output from previous day: 09/26 0701 - 09/27 0700 In: 953.4 [I.V.:868.4; NG/GT:35; IV Piggyback:50] Out: 9678 [Urine:210; Emesis/NG output:50; Chest Tube:200] Intake/Output this shift: Total I/O In: 397.2 [I.V.:397.2] Out: 2091 [Urine:85; Other:1906; Chest Tube:100]  General appearance: alert, cooperative, moderate distress and pale Resp: rales LLL and RLL and rhonchi LLL and RLL Chest wall: R Coulter cath, Mediastinal tubes Cardio: S1, S2 normal, friction rub heard rub louder, irreg rhythm and Gr 2/6 M GI: pos bs, mild distension Extremities: edema 2-3+  Lab Results:  Recent Labs  06/09/16 0326 06/10/16 0352  WBC 15.8* 17.2*  HGB 11.1* 11.7*  HCT 34.1* 36.3*  PLT 109* 181   BMET:  Recent Labs  06/09/16 1514 06/10/16 0352  NA 138 140  K 3.6 3.9  CL 106 103  CO2 25 23  GLUCOSE 99 135*  BUN 21* 20  CREATININE 2.12* 2.29*  CALCIUM 8.1* 8.3*    Recent Labs  06/08/16 0401  PTH 66*   Iron Studies:  Recent Labs  06/07/16 1230  IRON 20*  TIBC 147*    Studies/Results: Dg Chest Port 1 View  Result Date: 06/09/2016 CLINICAL DATA:  60 year old male post CABG.  Subsequent encounter. EXAM: PORTABLE CHEST 1 VIEW COMPARISON:  06/08/2016 chest x-ray FINDINGS: Endotracheal tube tip 5.9 cm above the carina. Left central line tip left subclavian level. Nasogastric tube and Panda tube in place. Tip not imaged on the current exam. Right central line tip caval atrial junction/proximal right atrium without  change. Mediastinal drain and left chest tube in place. Swan-Ganz catheter and right chest tube and been removed. No pneumothorax detected. Basilar atelectasis greater on the right without significant change. Cannot exclude small right-sided pleural effusion. Central pulmonary vascular prominence stable. Heart size top-normal post CABG. IMPRESSION: Right chest tube and Swan-Ganz catheter been removed. No pneumothorax detected. Remainder of findings without significant change as detailed above. Electronically Signed   By: Genia Del M.D.   On: 06/09/2016 08:29   Dg Abd Portable 1v  Result Date: 06/08/2016 CLINICAL DATA:  60 year old male with a history of feeding tube placement EXAM: PORTABLE ABDOMEN - 1 VIEW COMPARISON:  06/01/2016, chest x-ray 06/08/2016 FINDINGS: Limited plain film of the abdomen. Weighted tip enteric feeding tube terminates in the third portion the duodenum. Gastric tube terminates within the stomach. Epicardial pacing leads project over the upper abdomen and lower chest. Partial visualization of Swan-Ganz catheter at the lower chest. 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 Gas within stomach, and small bowel. IMPRESSION: Weighted tip enteric feeding tube terminates within the third portion of duodenum. Gastric tube terminates within the stomach. Epicardial pacing leads project over the upper abdomen, with surgical changes of median sternotomy and CABG. Swan-Ganz catheter partially visualized. Gas within stomach and small bowel. Signed, Dulcy Fanny. Earleen Newport, DO [facility] Electronically Signed   By: Corrie Mckusick D.O.   On: 06/08/2016 10:35    I have reviewed the patient's  current medications.  Assessment/Plan: 1 AKI/CKD 4  Vol xs . CRRT going well with vol removal, good solute control, acid/base/K.  Will cont another 48 h with goals of vol off, clinical stability, then transition to IHD 2 CAD per cards/CVS 3 Resp  failure per CCM 4 DM controlled 5 Anemia controlled 6 Nutrition eval P cont CRRT, lower vol, control rhythm, slowly mobilize    LOS: 10 days   Nur Rabold L 06/10/2016,6:51 AM

## 2016-06-11 ENCOUNTER — Inpatient Hospital Stay (HOSPITAL_COMMUNITY): Payer: BLUE CROSS/BLUE SHIELD

## 2016-06-11 DIAGNOSIS — J189 Pneumonia, unspecified organism: Secondary | ICD-10-CM

## 2016-06-11 DIAGNOSIS — I5022 Chronic systolic (congestive) heart failure: Secondary | ICD-10-CM

## 2016-06-11 LAB — CBC
HCT: 35.1 % — ABNORMAL LOW (ref 39.0–52.0)
HEMOGLOBIN: 11.4 g/dL — AB (ref 13.0–17.0)
MCH: 28.9 pg (ref 26.0–34.0)
MCHC: 32.5 g/dL (ref 30.0–36.0)
MCV: 89.1 fL (ref 78.0–100.0)
Platelets: 245 10*3/uL (ref 150–400)
RBC: 3.94 MIL/uL — AB (ref 4.22–5.81)
RDW: 14.3 % (ref 11.5–15.5)
WBC: 16.1 10*3/uL — ABNORMAL HIGH (ref 4.0–10.5)

## 2016-06-11 LAB — COMPREHENSIVE METABOLIC PANEL
ALT: 38 U/L (ref 17–63)
AST: 38 U/L (ref 15–41)
Albumin: 2 g/dL — ABNORMAL LOW (ref 3.5–5.0)
Alkaline Phosphatase: 138 U/L — ABNORMAL HIGH (ref 38–126)
Anion gap: 9 (ref 5–15)
BUN: 22 mg/dL — ABNORMAL HIGH (ref 6–20)
CO2: 23 mmol/L (ref 22–32)
Calcium: 8.1 mg/dL — ABNORMAL LOW (ref 8.9–10.3)
Chloride: 107 mmol/L (ref 101–111)
Creatinine, Ser: 2.29 mg/dL — ABNORMAL HIGH (ref 0.61–1.24)
GFR calc Af Amer: 34 mL/min — ABNORMAL LOW (ref >60)
GFR calc non Af Amer: 30 mL/min — ABNORMAL LOW (ref >60)
Glucose, Bld: 110 mg/dL — ABNORMAL HIGH (ref 65–99)
Potassium: 3.8 mmol/L (ref 3.5–5.1)
Sodium: 139 mmol/L (ref 135–145)
Total Bilirubin: 1.1 mg/dL (ref 0.3–1.2)
Total Protein: 5.2 g/dL — ABNORMAL LOW (ref 6.5–8.1)

## 2016-06-11 LAB — RENAL FUNCTION PANEL
ALBUMIN: 2 g/dL — AB (ref 3.5–5.0)
Albumin: 2.1 g/dL — ABNORMAL LOW (ref 3.5–5.0)
Anion gap: 10 (ref 5–15)
Anion gap: 8 (ref 5–15)
BUN: 23 mg/dL — AB (ref 6–20)
BUN: 21 mg/dL — AB (ref 6–20)
CHLORIDE: 103 mmol/L (ref 101–111)
CO2: 23 mmol/L (ref 22–32)
CO2: 24 mmol/L (ref 22–32)
Calcium: 8 mg/dL — ABNORMAL LOW (ref 8.9–10.3)
Calcium: 8.1 mg/dL — ABNORMAL LOW (ref 8.9–10.3)
Chloride: 106 mmol/L (ref 101–111)
Creatinine, Ser: 2.27 mg/dL — ABNORMAL HIGH (ref 0.61–1.24)
Creatinine, Ser: 2.25 mg/dL — ABNORMAL HIGH (ref 0.61–1.24)
GFR calc Af Amer: 35 mL/min — ABNORMAL LOW (ref >60)
GFR calc Af Amer: 35 mL/min — ABNORMAL LOW (ref >60)
GFR calc non Af Amer: 30 mL/min — ABNORMAL LOW (ref >60)
GFR calc non Af Amer: 30 mL/min — ABNORMAL LOW (ref >60)
GLUCOSE: 108 mg/dL — AB (ref 65–99)
GLUCOSE: 244 mg/dL — AB (ref 65–99)
PHOSPHORUS: 2.3 mg/dL — AB (ref 2.5–4.6)
POTASSIUM: 3.8 mmol/L (ref 3.5–5.1)
POTASSIUM: 3.9 mmol/L (ref 3.5–5.1)
Phosphorus: 4.3 mg/dL (ref 2.5–4.6)
SODIUM: 139 mmol/L (ref 135–145)
Sodium: 135 mmol/L (ref 135–145)

## 2016-06-11 LAB — GLUCOSE, CAPILLARY
Glucose-Capillary: 105 mg/dL — ABNORMAL HIGH (ref 65–99)
Glucose-Capillary: 127 mg/dL — ABNORMAL HIGH (ref 65–99)
Glucose-Capillary: 135 mg/dL — ABNORMAL HIGH (ref 65–99)
Glucose-Capillary: 167 mg/dL — ABNORMAL HIGH (ref 65–99)
Glucose-Capillary: 167 mg/dL — ABNORMAL HIGH (ref 65–99)
Glucose-Capillary: 216 mg/dL — ABNORMAL HIGH (ref 65–99)

## 2016-06-11 LAB — CARBOXYHEMOGLOBIN
CARBOXYHEMOGLOBIN: 1.2 % (ref 0.5–1.5)
CARBOXYHEMOGLOBIN: 1.2 % (ref 0.5–1.5)
Methemoglobin: 1.1 % (ref 0.0–1.5)
Methemoglobin: 1 % (ref 0.0–1.5)
O2 Saturation: 63.2 %
O2 Saturation: 62.2 %
Total hemoglobin: 11.9 g/dL — ABNORMAL LOW (ref 12.0–16.0)
Total hemoglobin: 10.5 g/dL — ABNORMAL LOW (ref 12.0–16.0)

## 2016-06-11 LAB — MAGNESIUM: Magnesium: 2.7 mg/dL — ABNORMAL HIGH (ref 1.7–2.4)

## 2016-06-11 MED ORDER — AMIODARONE HCL 200 MG PO TABS
400.0000 mg | ORAL_TABLET | Freq: Two times a day (BID) | ORAL | Status: DC
  Administered 2016-06-11 – 2016-06-12 (×4): 400 mg via ORAL
  Filled 2016-06-11 (×5): qty 2

## 2016-06-11 MED ORDER — DEXTROSE 5 % IV SOLN
30.0000 mmol | Freq: Once | INTRAVENOUS | Status: AC
  Administered 2016-06-11: 30 mmol via INTRAVENOUS
  Filled 2016-06-11: qty 10

## 2016-06-11 NOTE — Progress Notes (Signed)
TCTS DAILY ICU PROGRESS NOTE                   [address]            [place] [address]          [phone]   6 Days Post-Op Procedure(s) (LRB): CORONARY ARTERY BYPASS GRAFTING (CABG) x3 LIMA to LAD -SVG to OM -SVG to RCA (N/A) INTRAOPERATIVE TRANSESOPHAGEAL ECHOCARDIOGRAM (N/A) INSERTION OF DIALYSIS/trialysis CATHETER (N/A)  Total Length of Stay:  LOS: 11 days   Subjective: conts to feel better   Objective: Vital signs in last 24 hours: Temp:  [98.1 F (36.7 C)-99 F (37.2 C)] 99 F (37.2 C) (09/28 0400) Pulse Rate:  [76-131] 88 (09/28 0700) Cardiac Rhythm: Normal sinus rhythm (09/28 0723) Resp:  [15-24] 20 (09/28 0700) BP: (105-152)/(58-83) 143/72 (09/28 0700) SpO2:  [93 %-99 %] 94 % (09/28 0700) Weight:  [143 lb 8.3 oz (65.1 kg)] 143 lb 8.3 oz (65.1 kg) (09/28 0400)  Filed Weights   06/09/16 0500 06/10/16 0500 06/11/16 0400  Weight: 148 lb 13 oz (67.5 kg) 145 lb 8.1 oz (66 kg) 143 lb 8.3 oz (65.1 kg)    Weight change: -1 lb 15.8 oz (-0.9 kg)   Hemodynamic parameters for last 24 hours: CVP:  [3 mmHg] 3 mmHg  Intake/Output from previous day: 09/27 0701 - 09/28 0700 In: 376.7 [I.V.:376.7] Out: 6237 [Urine:103; Chest Tube:90]  Intake/Output this shift: Total I/O In: 260 [IV Piggyback:260] Out: -      Current Meds: Scheduled Meds: . amiodarone  400 mg Oral BID  . aspirin EC  325 mg Oral Daily   Or  . aspirin  324 mg Per Tube Daily  . atorvastatin  80 mg Oral q1800  . bisacodyl  10 mg Oral Daily   Or  . bisacodyl  10 mg Rectal Daily  . chlorhexidine  15 mL Mouth Rinse BID  . docusate sodium  200 mg Oral Daily  . ferumoxytol  510 mg Intravenous Weekly  . heparin subcutaneous  5,000 Units Subcutaneous Q12H  . insulin aspart  0-15 Units Subcutaneous Q4H  . insulin detemir  6 Units Subcutaneous BID  . isosorbide mononitrate  15 mg Oral Daily  . mouth rinse  15 mL Mouth Rinse q12n4p  . metoprolol tartrate  12.5 mg Oral BID  .  pantoprazole  40 mg Oral Q1200  . sodium chloride flush  10-40 mL Intracatheter Q12H  . sodium chloride flush  3 mL Intravenous Q12H  . sodium phosphate  Dextrose 5% IVPB  30 mmol Intravenous Once   Continuous Infusions: . sodium chloride 250 mL (06/10/16 0154)  . dialysis replacement fluid (prismasate) 400 mL/hr at 06/10/16 2044  . dialysis replacement fluid (prismasate) 200 mL/hr at 06/10/16 2044  . dialysate (PRISMASATE) 1,000 mL/hr at 06/11/16 0712   PRN Meds:.heparin, Influenza vac split quadrivalent PF, labetalol, morphine injection, ondansetron (ZOFRAN) IV, oxyCODONE, sodium chloride, sodium chloride flush, sodium chloride flush, traMADol  General appearance: alert, cooperative and no distress Heart: regular rate and rhythm Lungs: dim in bases Abdomen: soft, nontender Extremities: + edema Wound: incis stable  Lab Results: CBC: Recent Labs  06/10/16 0352 06/11/16 0448  WBC 17.2* 16.1*  HGB 11.7* 11.4*  HCT 36.3* 35.1*  PLT 181 245   BMET:  Recent Labs  06/10/16 1625 06/11/16 0448  NA 138 139  139  K 3.7 3.8  3.8  CL 104 107  106  CO2 23 23  23  GLUCOSE 124* 110*  108*  BUN 22* 22*  23*  CREATININE 2.31* 2.29*  2.27*  CALCIUM 8.3* 8.1*  8.0*    CMET: Lab Results  Component Value Date   WBC 16.1 (H) 06/11/2016   HGB 11.4 (L) 06/11/2016   HCT 35.1 (L) 06/11/2016   PLT 245 06/11/2016   GLUCOSE 108 (H) 06/11/2016   GLUCOSE 110 (H) 06/11/2016   TRIG 132 06/03/2016   ALT 38 06/11/2016   AST 38 06/11/2016   NA 139 06/11/2016   NA 139 06/11/2016   K 3.8 06/11/2016   K 3.8 06/11/2016   CL 106 06/11/2016   CL 107 06/11/2016   CREATININE 2.27 (H) 06/11/2016   CREATININE 2.29 (H) 06/11/2016   BUN 23 (H) 06/11/2016   BUN 22 (H) 06/11/2016   CO2 23 06/11/2016   CO2 23 06/11/2016   INR 1.47 06/06/2016   HGBA1C 10.4 (H) 06/04/2016   ABG    Component Value Date/Time   PHART 7.439 06/09/2016 1247   PCO2ART 38.8 06/09/2016 1247   PO2ART 81.0 (L)  06/09/2016 1247   HCO3 26.3 06/09/2016 1247   TCO2 27 06/09/2016 1247   ACIDBASEDEF 3.0 (H) 06/09/2016 1118   O2SAT 63.2 06/11/2016 0418      PT/INR: No results for input(s): LABPROT, INR in the last 72 hours. Radiology: Dg Chest Port 1 View  Result Date: 06/11/2016 CLINICAL DATA:  Chest soreness.  CABG EXAM: PORTABLE CHEST 1 VIEW COMPARISON:  Yesterday FINDINGS: Stable positioning of central lines, including left IJ catheter with tip at the distal left brachiocephalic vein. Left chest tube has been removed without pneumothorax. Unchanged basilar atelectasis, greater on the right with probable small effusion. Stable postoperative heart size. CABG changes. IMPRESSION: 1. Interval removal of left chest tube without pneumothorax. 2. Stable bibasilar atelectasis. Electronically Signed   By: Monte Fantasia M.D.   On: 06/11/2016 07:32   Results for orders placed or performed during the hospital encounter of 05/31/16  MRSA PCR Screening     Status: None   Collection Time: 05/31/16  5:05 PM  Result Value Ref Range Status   MRSA by PCR NEGATIVE NEGATIVE Final    Comment:        The GeneXpert MRSA Assay (FDA approved for NASAL specimens only), is one component of a comprehensive MRSA colonization surveillance program. It is not intended to diagnose MRSA infection nor to guide or monitor treatment for MRSA infections.   Surgical pcr screen     Status: Abnormal   Collection Time: 06/01/16  8:59 AM  Result Value Ref Range Status   MRSA, PCR NEGATIVE NEGATIVE Final   Staphylococcus aureus POSITIVE (A) NEGATIVE Final    Comment:        The Xpert SA Assay (FDA approved for NASAL specimens in patients over 17 years of age), is one component of a comprehensive surveillance program.  Test performance has been validated by [facility] for patients greater than or equal to 58 year old. It is not intended to diagnose infection nor to guide or monitor treatment.   Culture, blood (single) w  Reflex to ID Panel     Status: None   Collection Time: 06/01/16  6:30 PM  Result Value Ref Range Status   Specimen Description BLOOD RIGHT HAND  Final   Special Requests IN PEDIATRIC BOTTLE 3CC  Final   Culture NO GROWTH 5 DAYS  Final   Report Status 06/06/2016 FINAL  Final  Culture, respiratory (NON-Expectorated)  Status: None   Collection Time: 06/02/16  9:55 AM  Result Value Ref Range Status   Specimen Description TRACHEAL ASPIRATE  Final   Special Requests Normal  Final   Gram Stain   Final    MODERATE WBC PRESENT,BOTH PMN AND MONONUCLEAR MODERATE GRAM POSITIVE COCCI IN CLUSTERS FEW GRAM POSITIVE COCCI IN CHAINS FEW GRAM VARIABLE ROD    Culture MODERATE SERRATIA MARCESCENS  Final   Report Status 06/04/2016 FINAL  Final   Organism ID, Bacteria SERRATIA MARCESCENS  Final      Susceptibility   Serratia marcescens - MIC*    CEFAZOLIN >=64 RESISTANT Resistant     CEFEPIME <=1 SENSITIVE Sensitive     CEFTAZIDIME <=1 SENSITIVE Sensitive     CEFTRIAXONE <=1 SENSITIVE Sensitive     CIPROFLOXACIN <=0.25 SENSITIVE Sensitive     GENTAMICIN <=1 SENSITIVE Sensitive     TRIMETH/SULFA <=20 SENSITIVE Sensitive     * MODERATE SERRATIA MARCESCENS  Gram stain     Status: None   Collection Time: 06/02/16 11:01 AM  Result Value Ref Range Status   Specimen Description THORACENTESIS  Final   Special Requests NONE  Final   Gram Stain   Final    WBC PRESENT,BOTH PMN AND MONONUCLEAR NO ORGANISMS SEEN CYTOSPIN    Report Status 06/02/2016 FINAL  Final  Culture, body fluid-bottle     Status: None   Collection Time: 06/02/16 11:01 AM  Result Value Ref Range Status   Specimen Description THORACENTESIS  Final   Special Requests NONE  Final   Culture NO GROWTH 5 DAYS  Final   Report Status 06/07/2016 FINAL  Final    Assessment/Plan: S/P Procedure(s) (LRB): CORONARY ARTERY BYPASS GRAFTING (CABG) x3 LIMA to LAD -SVG to OM -SVG to RCA (N/A) INTRAOPERATIVE TRANSESOPHAGEAL ECHOCARDIOGRAM  (N/A) INSERTION OF DIALYSIS/trialysis CATHETER (N/A)  1 AHF conts to manage acute/chronic CHF issues 2 renal issues managed per nephrology 3 will need coumadin after perm dialysis cath placed 4 afib, currently in sinus- cont current management 5 anemia is stable 6 leukocytosis slowly improving, tmax 99- serratia pneumonia abx have ended 7 CBG's adwq controlled 8 SLP rec is NPO- being re-evaluated today and nursing thinks he's doing much better [name],Kenneth E 06/11/2016 7:40 AM

## 2016-06-11 NOTE — Evaluation (Signed)
Physical Therapy Evaluation Patient Details Name: Kenneth [name] MRN: [id number] DOB: 31-May-1956 Today's Date: 06/11/2016   History of Present Illness  Pt adm with SOB and required emergent intubation and cardiac cath. Underwent CABG on 06/05/16. Requiring CRRT post op. PMH - DM, CKD, HTN  Clinical Impression  Pt admitted with above diagnosis and presents to PT with functional limitations due to deficits listed below (See PT problem list). Pt needs skilled PT to maximize independence and safety to allow discharge to CIR. Pt very motivated and expect as medical status improves his mobility and function will improve.     Follow Up Recommendations CIR    Equipment Recommendations  Rolling walker with 5" wheels    Recommendations for Other Services OT consult     Precautions / Restrictions Precautions Precautions: Fall Precaution Comments: CRRT lines      Mobility  Bed Mobility Overal bed mobility: Needs Assistance Bed Mobility: Supine to Sit     Supine to sit: +2 for physical assistance;Mod assist     General bed mobility comments: Assist to elevate trunk into sitting and to bring hips to EOB  Transfers Overall transfer level: Needs assistance Equipment used: 2 person hand held assist Transfers: Sit to/from Stand;Stand Pivot Transfers Sit to Stand: +2 physical assistance;Mod assist Stand pivot transfers: +2 physical assistance;Mod assist       General transfer comment: Assist to bring hips and trunk up. Assist to perform pivotal steps to chair. 3rd person to manage CRRT lines.  Ambulation/Gait                Stairs            Wheelchair Mobility    Modified Rankin (Stroke Patients Only)       Balance Overall balance assessment: Needs assistance Sitting-balance support: Bilateral upper extremity supported Sitting balance-Leahy Scale: Poor Sitting balance - Comments: UE support and min guard for sitting EOB   Standing balance support: Bilateral  upper extremity supported Standing balance-Leahy Scale: Poor Standing balance comment: Bilateral forearm support and mod A for static standing                             Pertinent Vitals/Pain Pain Assessment: Faces Faces Pain Scale: Hurts a little bit Pain Location: incisional Pain Descriptors / Indicators: Operative site guarding Pain Intervention(s): Limited activity within patient's tolerance;Monitored during session    Home Living Family/patient expects to be discharged to:: Private residence Living Arrangements: Spouse/significant other;Children                    Prior Function Level of Independence: Independent               Hand Dominance        Extremity/Trunk Assessment   Upper Extremity Assessment: Generalized weakness           Lower Extremity Assessment: Generalized weakness         Communication   Communication: Prefers language other than English (functional with English but [place] is native)  Cognition Arousal/Alertness: Awake/alert Behavior During Therapy: WFL for tasks assessed/performed Overall Cognitive Status: Impaired/Different from baseline Area of Impairment: Problem solving;Following commands       Following Commands: Follows one step commands with increased time     Problem Solving: Slow processing General Comments: Pt having auditory hallucinations    General Comments      Exercises     Assessment/Plan    PT  Assessment Patient needs continued PT services  PT Problem List Decreased strength;Decreased activity tolerance;Decreased balance;Decreased mobility;Decreased knowledge of precautions;Decreased safety awareness;Decreased knowledge of use of DME          PT Treatment Interventions DME instruction;Therapeutic exercise;Gait training;Balance training;Stair training;Functional mobility training;Patient/family education;Therapeutic activities;Cognitive remediation    PT Goals (Current goals can be  found in the Care Plan section)  Acute Rehab PT Goals Patient Stated Goal: wants to walk PT Goal Formulation: With patient Time For Goal Achievement: 06/25/16 Potential to Achieve Goals: Good    Frequency Min 3X/week   Barriers to discharge        Co-evaluation               End of Session   Activity Tolerance: Patient tolerated treatment well Patient left: in chair;with call bell/phone within reach Nurse Communication: Mobility status (nurse assisted with transfer)         Time: 0943-1000 PT Time Calculation (min) (ACUTE ONLY): 17 min   Charges:   PT Evaluation $PT Eval Moderate Complexity: 1 Procedure     PT G Codes:        Ronnesha Mester 07/10/2016, 11:39 AM Suanne Marker PT [phone]

## 2016-06-11 NOTE — Progress Notes (Signed)
Inpatient Rehabilitation  Per PT request, patient was screened by Gunnar Fusi for appropriateness for an Inpatient Acute Rehab consult.  At this time we are recommending an Inpatient Rehab consult along with OT evaluation orders so that we can initiate insurance authorization.  Please order if you are agreeable.    Carmelia Roller., CCC/SLP Admission Coordinator  [place]  Cell [phone]

## 2016-06-11 NOTE — Progress Notes (Signed)
Subjective: Interval History: has no complaint , feels better.  Objective: Vital signs in last 24 hours: Temp:  [98.1 F (36.7 C)-99 F (37.2 C)] 99 F (37.2 C) (09/28 0400) Pulse Rate:  [76-131] 85 (09/28 0615) Resp:  [15-24] 22 (09/28 0615) BP: (105-156)/(58-83) 124/71 (09/28 0600) SpO2:  [93 %-99 %] 96 % (09/28 0615) Weight:  [65.1 kg (143 lb 8.3 oz)] 65.1 kg (143 lb 8.3 oz) (09/28 0400) Weight change: -0.9 kg (-1 lb 15.8 oz)  Intake/Output from previous day: 09/27 0701 - 09/28 0700 In: 364.4 [I.V.:364.4] Out: 6712 [Urine:103; Chest Tube:90] Intake/Output this shift: Total I/O In: 135.3 [I.V.:135.3] Out: 1791 [Urine:40; Other:1751]  General appearance: alert, cooperative, no distress and pale Resp: diminished breath sounds bilaterally, rales bibasilar and rhonchi bibasilar Chest wall: [facility] HD cath Cardio: S1, S2 normal and systolic murmur: holosystolic 2/6, blowing at apex GI: pos bs, soft. liver down 4 cm Extremities: edema 1-2+ Skin: macullopapullar erythematous rash abdm, lowr chest ,upper legs  Lab Results:  Recent Labs  06/10/16 0352 06/11/16 0448  WBC 17.2* 16.1*  HGB 11.7* 11.4*  HCT 36.3* 35.1*  PLT 181 245   BMET:  Recent Labs  06/10/16 1625 06/11/16 0448  NA 138 139  139  K 3.7 3.8  3.8  CL 104 107  106  CO2 23 23  23   GLUCOSE 124* 110*  108*  BUN 22* 22*  23*  CREATININE 2.31* 2.29*  2.27*  CALCIUM 8.3* 8.1*  8.0*   No results for input(s): PTH in the last 72 hours. Iron Studies: No results for input(s): IRON, TIBC, TRANSFERRIN, FERRITIN in the last 72 hours.  Studies/Results: Dg Chest Port 1 View  Result Date: 06/10/2016 CLINICAL DATA:  Coronary artery disease, status post coronary artery bypass grafting. EXAM: PORTABLE CHEST 1 VIEW COMPARISON:  June 09, 2016 FINDINGS: Endotracheal tube and nasogastric tube have been removed in. Left jugular catheter tip is in the left innominate vein, stable. Right-sided central catheter tip  is in the superior vena cava. There is a chest tube on the left. No pneumothorax evident. There is a fairly small pleural effusion on the right. There is patchy bibasilar atelectasis. There is no airspace consolidation. Heart is mildly enlarged with pulmonary vascularity within normal limits. No adenopathy. There is atherosclerotic calcification in the aorta. IMPRESSION: Tube and catheter positions as described without pneumothorax. Small right pleural effusion with patchy bibasilar atelectasis. Stable cardiac prominence. Aortic atherosclerosis. Electronically Signed   By: Lowella Grip III M.D.   On: 06/10/2016 07:35    I have reviewed the patient's current medications.  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  Vol improving, still xs, bp lower,so lower net neg.  Acid/base/k/solute ok.  Will cont CRT [id number] and transition 2 Rash new probably hydralazine but cannot say 100% 3 Anemia stable 4 CAD/CABG per CVS 5 Afib amio controlling 6 DM controlled 7 nutrition pos P lower net neg ,stop hydral, plan transition, will need PC     LOS: 11 days   Willadeen Colantuono L 06/11/2016,6:46 AM

## 2016-06-11 NOTE — Progress Notes (Signed)
Patient ID: Kenneth [name], male   DOB: [birth year], 60 y.o.   MRN: [id number]   SICU Evening Rounds:   Hemodynamically stable    On CRRT. Foley is out   Dysphagia diet.  CBC    Component Value Date/Time   WBC 16.1 (H) 06/11/2016 0448   RBC 3.94 (L) 06/11/2016 0448   HGB 11.4 (L) 06/11/2016 0448   HCT 35.1 (L) 06/11/2016 0448   PLT 245 06/11/2016 0448   MCV 89.1 06/11/2016 0448   MCH 28.9 06/11/2016 0448   MCHC 32.5 06/11/2016 0448   RDW 14.3 06/11/2016 0448     BMET    Component Value Date/Time   NA 135 06/11/2016 1532   K 3.9 06/11/2016 1532   CL 103 06/11/2016 1532   CO2 24 06/11/2016 1532   GLUCOSE 244 (H) 06/11/2016 1532   BUN 21 (H) 06/11/2016 1532   CREATININE 2.25 (H) 06/11/2016 1532   CALCIUM 8.1 (L) 06/11/2016 1532   GFRNONAA 30 (L) 06/11/2016 1532   GFRAA 35 (L) 06/11/2016 1532     A/P:  Stable day. Continue current plans

## 2016-06-11 NOTE — Progress Notes (Signed)
[facility] PCCM  S: voice stronger  O:  Vitals:   06/11/16 0600 06/11/16 0615 06/11/16 0700 06/11/16 0800  BP: 124/71  (!) 143/72 (!) 150/69  Pulse: (!) 131 85 88 88  Resp: (!) 22 (!) 22 20 19   Temp:      TempSrc:      SpO2: 95% 96% 94% 95%  Weight:      Height:       RA  Gen: well appearing in bed HENT: OP clear, neck supple PULM: CTA B, normal percussion CV: RRR today, trace edema, midline scar well healed GI: BS+, soft, nontender Derm: no cyanosis or rash Psyche: normal mood and affect Neuro: voice stronger today, answering questions appropriately, following commands  BMET    Component Value Date/Time   NA 139 06/11/2016 0448   NA 139 06/11/2016 0448   K 3.8 06/11/2016 0448   K 3.8 06/11/2016 0448   CL 106 06/11/2016 0448   CL 107 06/11/2016 0448   CO2 23 06/11/2016 0448   CO2 23 06/11/2016 0448   GLUCOSE 108 (H) 06/11/2016 0448   GLUCOSE 110 (H) 06/11/2016 0448   BUN 23 (H) 06/11/2016 0448   BUN 22 (H) 06/11/2016 0448   CREATININE 2.27 (H) 06/11/2016 0448   CREATININE 2.29 (H) 06/11/2016 0448   CALCIUM 8.0 (L) 06/11/2016 0448   CALCIUM 8.1 (L) 06/11/2016 0448   GFRNONAA 30 (L) 06/11/2016 0448   GFRNONAA 30 (L) 06/11/2016 0448   GFRAA 35 (L) 06/11/2016 0448   GFRAA 34 (L) 06/11/2016 0448    Impression: AKI Acute respiratory failure> resolved Pneumonia> better CAD > s/p CABG Ischemic cardiomyopathy> better  Plan SLP eval today, advance diet Stop rocephin 9/30 Cardiac meds per TCTS/Cardiology CVVHD per renal Would consult PT per TCTS protocol  PCCM available prn  Roselie Awkward, MD [place] PCCM Pager: [phone] Cell: [phone] After 3pm or if no response, call [phone]

## 2016-06-11 NOTE — Progress Notes (Addendum)
Patient ID: Kenneth [name], male   DOB: 21-Jun-1956, 60 y.o.   MRN: [id number]     Advanced Heart Failure Rounding Note  Referring Physician: Raylene Miyamoto, MD  Primary Physician: Rodena Medin, MD Primary HF: New ( Dr Aundra Dubin)  Reason for Consultation: Acute respiratory failure  Subjective:    Kenneth [name] is a 60 y.o. male with hx of poorly controlled diabetes, HTN, CKD IV, and HLD.  Admitted 05/31/16 with orthopnea, DOE, and CP. EKG did not meet stemi criteria. Troponin 1.4. Echo showed global LV dysfunction. Started on NIMV for acute respiratory failure, failed requiring intubation.   Echo 05/31/16 LVEF 35-40%, Grade 2 DD  LHC 05/31/16 showed LM lesion, 40 %stenosed, Ramus lesion, 80 %stenosed, Ost Cx to Prox Cx lesion, 80 %stenosed, Mid LAD lesion, 90 %stenosed, Ost 2nd Diag to 2nd Diag lesion, 90 %stenosed, Prox RCA to Mid RCA lesion, 100 %stenosed. IABP inserted.   Atrial fibrillation with RVR developed 9/18.  Thoracentesis on right on 9/19,  transudative (1000 cc off).   Thoracentesis on left on 9/21, 600 cc off.   Underwent CABG x 3. 9/22  IABP removed 06/07/16. Extubated 06/09/16. Coox 63% on Milrinone 0.1 mcg/kg/min. CVP 2-3.  Remains on CVVH, 75 cc/hr UF.  Had run of atrial fibrillation/RVR overnight, back in NSR with amiodarone bolus.   Rash noted today, ?hydralazine, stopped by renal.   Objective:   Weight Range: 143 lb 8.3 oz (65.1 kg) Body mass index is 23.16 kg/m.   Vital Signs:   Temp:  [98.1 F (36.7 C)-99 F (37.2 C)] 99 F (37.2 C) (09/28 0400) Pulse Rate:  [76-131] 88 (09/28 0700) Resp:  [15-24] 20 (09/28 0700) BP: (105-152)/(58-83) 143/72 (09/28 0700) SpO2:  [93 %-99 %] 94 % (09/28 0700) Weight:  [143 lb 8.3 oz (65.1 kg)] 143 lb 8.3 oz (65.1 kg) (09/28 0400) Last BM Date: 06/11/16  Weight change: Filed Weights   06/09/16 0500 06/10/16 0500 06/11/16 0400  Weight: 148 lb 13 oz (67.5 kg) 145 lb 8.1 oz (66 kg) 143 lb 8.3 oz (65.1 kg)     Intake/Output:   Intake/Output Summary (Last 24 hours) at 06/11/16 0739 Last data filed at 06/11/16 2355  Gross per 24 hour  Intake           636.67 ml  Output             3874 ml  Net         -3237.33 ml     Physical Exam: CVP 2-3 General:  Extubated. Stable. Fatigued appearing HEENT: Normal Neck: supple. JVP flat. Carotids 2+ bilat; Cor: Sternal dressing RRR. No M/G/R.  Lungs: Diminished throughout   Abdomen: MIldly distended. Extremities: no cyanosis, clubbing, rash. Trace ankle edema. R groin IABP stable.  Neuro: alert & orientedx3, cranial nerves grossly intact. moves all 4 extremities w/o difficulty. Affect pleasant  Telemetry: Reviewed, NSR this morning, had afib last night  Labs: CBC  Recent Labs  06/10/16 0352 06/11/16 0448  WBC 17.2* 16.1*  HGB 11.7* 11.4*  HCT 36.3* 35.1*  MCV 89.6 89.1  PLT 181 732   Basic Metabolic Panel  Recent Labs  06/10/16 0352  06/10/16 1625 06/11/16 0448  NA 140  < > 138 139  139  K 3.9  < > 3.7 3.8  3.8  CL 103  < > 104 107  106  CO2 23  < > 23 23  23   GLUCOSE 135*  < > 124* 110*  108*  BUN 20  < > 22* 22*  23*  CREATININE 2.29*  < > 2.31* 2.29*  2.27*  CALCIUM 8.3*  < > 8.3* 8.1*  8.0*  MG 2.6*  --   --  2.7*  PHOS 2.8  < > 2.7 2.3*  < > = values in this interval not displayed. Liver Function Tests  Recent Labs  06/10/16 0352  06/10/16 1625 06/11/16 0448  AST 46*  --   --  38  ALT 45  --   --  38  ALKPHOS 143*  --   --  138*  BILITOT 1.1  --   --  1.1  PROT 5.2*  --   --  5.2*  ALBUMIN 1.9*  < > 2.0* 2.0*  2.0*  < > = values in this interval not displayed. No results for input(s): LIPASE, AMYLASE in the last 72 hours. Cardiac Enzymes No results for input(s): CKTOTAL, CKMB, CKMBINDEX, TROPONINI in the last 72 hours.  BNP: BNP (last 3 results)  Recent Labs  05/31/16 1435  BNP 2,537.9*    ProBNP (last 3 results) No results for input(s): PROBNP in the last 8760 hours.   D-Dimer No  results for input(s): DDIMER in the last 72 hours. Hemoglobin A1C No results for input(s): HGBA1C in the last 72 hours. Fasting Lipid Panel No results for input(s): CHOL, HDL, LDLCALC, TRIG, CHOLHDL, LDLDIRECT in the last 72 hours. Thyroid Function Tests No results for input(s): TSH, T4TOTAL, T3FREE, THYROIDAB in the last 72 hours.  Invalid input(s): FREET3  Other results:     Imaging/Studies:  Dg Chest Port 1 View  Result Date: 06/11/2016 CLINICAL DATA:  Chest soreness.  CABG EXAM: PORTABLE CHEST 1 VIEW COMPARISON:  Yesterday FINDINGS: Stable positioning of central lines, including left IJ catheter with tip at the distal left brachiocephalic vein. Left chest tube has been removed without pneumothorax. Unchanged basilar atelectasis, greater on the right with probable small effusion. Stable postoperative heart size. CABG changes. IMPRESSION: 1. Interval removal of left chest tube without pneumothorax. 2. Stable bibasilar atelectasis. Electronically Signed   By: Monte Fantasia M.D.   On: 06/11/2016 07:32   Dg Chest Port 1 View  Result Date: 06/10/2016 CLINICAL DATA:  Coronary artery disease, status post coronary artery bypass grafting. EXAM: PORTABLE CHEST 1 VIEW COMPARISON:  June 09, 2016 FINDINGS: Endotracheal tube and nasogastric tube have been removed in. Left jugular catheter tip is in the left innominate vein, stable. Right-sided central catheter tip is in the superior vena cava. There is a chest tube on the left. No pneumothorax evident. There is a fairly small pleural effusion on the right. There is patchy bibasilar atelectasis. There is no airspace consolidation. Heart is mildly enlarged with pulmonary vascularity within normal limits. No adenopathy. There is atherosclerotic calcification in the aorta. IMPRESSION: Tube and catheter positions as described without pneumothorax. Small right pleural effusion with patchy bibasilar atelectasis. Stable cardiac prominence. Aortic  atherosclerosis. Electronically Signed   By: Lowella Grip III M.D.   On: 06/10/2016 07:35    Latest Echo  Latest Cath   Medications:     Scheduled Medications: . amiodarone  400 mg Oral BID  . aspirin EC  325 mg Oral Daily   Or  . aspirin  324 mg Per Tube Daily  . atorvastatin  80 mg Oral q1800  . bisacodyl  10 mg Oral Daily   Or  . bisacodyl  10 mg Rectal Daily  . chlorhexidine  15 mL Mouth Rinse BID  .  docusate sodium  200 mg Oral Daily  . ferumoxytol  510 mg Intravenous Weekly  . heparin subcutaneous  5,000 Units Subcutaneous Q12H  . insulin aspart  0-15 Units Subcutaneous Q4H  . insulin detemir  6 Units Subcutaneous BID  . isosorbide mononitrate  15 mg Oral Daily  . mouth rinse  15 mL Mouth Rinse q12n4p  . metoprolol tartrate  12.5 mg Oral BID  . pantoprazole  40 mg Oral Q1200  . sodium chloride flush  10-40 mL Intracatheter Q12H  . sodium chloride flush  3 mL Intravenous Q12H  . sodium phosphate  Dextrose 5% IVPB  30 mmol Intravenous Once    Infusions: . sodium chloride 250 mL (06/10/16 0154)  . dialysis replacement fluid (prismasate) 400 mL/hr at 06/10/16 2044  . dialysis replacement fluid (prismasate) 200 mL/hr at 06/10/16 2044  . dialysate (PRISMASATE) 1,000 mL/hr at 06/11/16 7741    PRN Medications: heparin, Influenza vac split quadrivalent PF, labetalol, morphine injection, ondansetron (ZOFRAN) IV, oxyCODONE, sodium chloride, sodium chloride flush, sodium chloride flush, traMADol   Assessment/Plan   Kenneth [name] is a 60 y.o. male with hx of poorly controlled diabetes, HTN, CKD IV, and HLD.  Admitted 05/31/16 with orthopnea, DOE, and CP.  Intubated, sedated, placed on IABP.  S/p CABG x 3 9/22 (LIMA-LAD, SVG-OM, SVG-RCA)  1. Acute on chronic systolic CHF with cardiogenic shock: EF 35-40% with wall motion abnormalities, ischemic cardiomyopathy.  IABP out. s/p CABG x 3 on 9/22. Co-ox 63% with CVP 3 today.  - Continues CVVH for volume removal per renal at  rate of 75 ml/hr out. - May stop milrinone today, repeat co-ox in afternoon.  - Started hydralazine/Imdur yesterday but had rash, renal stopped hydralazine due to concern for drug rash. 2. CAD: Admitted with NSTEMI, severe 3VD. S/p 3v CABG 9/22 - ASA, atorvastatin 80.  3. AKI on CKD stage IV:  On CVVH now. Baseline creatinine seems to be around 2.5.    - Renal following, will need transition to HD it appears.  4. Acute respiratory failure: right thoracentesis and left thoracentesis done.   - Failed wean 06/07/16 as went into Afib RVR. Extubated 2/87/86 without complication.  5. Anemia: Hgb stable.  6. ID: Remains afebrile with Serratia in sputum. Completed abx.  7. Atrial fibrillation: Paroxysmal. Atrial fibrillation last night, now back in NSR.    - Increase amiodarone to 400 mg bid.  - He will need to begin on warfarin eventually, likely after more permanent access for HD placed.  - Weaning milrinone as above should decrease drive to go into atrial fibrillation.   Length of Stay: [address]  06/11/2016, 7:39 AM  Advanced Heart Failure Team Pager [phone] (M-F; 7a - 4p)  Please contact [facility] for night-coverage after hours (4p -7a ) and weekends on [email or web address]

## 2016-06-11 NOTE — Progress Notes (Signed)
Speech Language Pathology Treatment: Dysphagia  Patient Details Name: Kenneth [name] MRN: [id number] DOB: 1956/05/03 Today's Date: 06/11/2016 Time: 2707-8675 SLP Time Calculation (min) (ACUTE ONLY): 18 min  Assessment / Plan / Recommendation Clinical Impression  Per chart review and SLP's visit pt's voice is markedly stronger than days prior. Pt consumed PO trials of ice chips, thin liquid, pureed and regular solids. Pt displayed immediate throat clearing following sips of thin liquid via straw. This did not occur when thin liquid boluses were administered via tsp and cup sip. SLP gave minimal cueing for pt to take small sips throughout trials. Pt had mild oral residue following regular solids, but oral clearance was achieved given thin liquid trials. Recommend Dys 2 diet and thin liquids via cup sip only. Will f/u for diet tolerance and advancement given continued voice and mentation improvement at bedside.   HPI HPI: Pt is 60 y.o.male with hx of poorly controlled diabetes, HTN, CKD IV, and HLD. Admitted 05/31/16 with orthopnea, DOE, and CP. EKG did not meet stemi criteria. Troponin 1.4. Echo showed global LV dysfunction. Started on NIMV for acute respiratory failure, failed requiring intubation on 9/17 and was extubated on 9/26.      SLP Plan  Continue with current plan of care     Recommendations  Diet recommendations: Dysphagia 2 (fine chop);Thin liquid Liquids provided via: No straw;Cup Medication Administration: Whole meds with puree Supervision: Staff to assist with self feeding;Full supervision/cueing for compensatory strategies Compensations: Minimize environmental distractions;Slow rate;Small sips/bites;Follow solids with liquid Postural Changes and/or Swallow Maneuvers: Seated upright 90 degrees;Upright 30-60 min after meal                Oral Care Recommendations: Oral care BID Follow up Recommendations: 24 hour supervision/assistance Plan: Continue with current plan of  care       [place], Student SLP  Shela Leff 06/11/2016, 12:25 PM

## 2016-06-12 DIAGNOSIS — N184 Chronic kidney disease, stage 4 (severe): Secondary | ICD-10-CM

## 2016-06-12 LAB — COMPREHENSIVE METABOLIC PANEL
ALT: 44 U/L (ref 17–63)
AST: 48 U/L — ABNORMAL HIGH (ref 15–41)
Albumin: 2 g/dL — ABNORMAL LOW (ref 3.5–5.0)
Alkaline Phosphatase: 149 U/L — ABNORMAL HIGH (ref 38–126)
Anion gap: 10 (ref 5–15)
BUN: 20 mg/dL (ref 6–20)
CO2: 25 mmol/L (ref 22–32)
Calcium: 8.1 mg/dL — ABNORMAL LOW (ref 8.9–10.3)
Chloride: 103 mmol/L (ref 101–111)
Creatinine, Ser: 2.13 mg/dL — ABNORMAL HIGH (ref 0.61–1.24)
GFR calc Af Amer: 38 mL/min — ABNORMAL LOW (ref >60)
GFR calc non Af Amer: 32 mL/min — ABNORMAL LOW (ref >60)
Glucose, Bld: 119 mg/dL — ABNORMAL HIGH (ref 65–99)
Potassium: 3.6 mmol/L (ref 3.5–5.1)
Sodium: 138 mmol/L (ref 135–145)
Total Bilirubin: 1 mg/dL (ref 0.3–1.2)
Total Protein: 5.2 g/dL — ABNORMAL LOW (ref 6.5–8.1)

## 2016-06-12 LAB — PROTIME-INR
INR: 1.1
Prothrombin Time: 14.3 seconds (ref 11.4–15.2)

## 2016-06-12 LAB — CBC
HCT: 32.8 % — ABNORMAL LOW (ref 39.0–52.0)
Hemoglobin: 10.7 g/dL — ABNORMAL LOW (ref 13.0–17.0)
MCH: 29.2 pg (ref 26.0–34.0)
MCHC: 32.6 g/dL (ref 30.0–36.0)
MCV: 89.6 fL (ref 78.0–100.0)
Platelets: 266 10*3/uL (ref 150–400)
RBC: 3.66 MIL/uL — ABNORMAL LOW (ref 4.22–5.81)
RDW: 14.4 % (ref 11.5–15.5)
WBC: 12.4 10*3/uL — ABNORMAL HIGH (ref 4.0–10.5)

## 2016-06-12 LAB — RENAL FUNCTION PANEL
ALBUMIN: 2 g/dL — AB (ref 3.5–5.0)
ALBUMIN: 2 g/dL — AB (ref 3.5–5.0)
ANION GAP: 8 (ref 5–15)
Anion gap: 7 (ref 5–15)
BUN: 20 mg/dL (ref 6–20)
BUN: 29 mg/dL — ABNORMAL HIGH (ref 6–20)
CALCIUM: 8.1 mg/dL — AB (ref 8.9–10.3)
CALCIUM: 7.7 mg/dL — AB (ref 8.9–10.3)
CO2: 25 mmol/L (ref 22–32)
CO2: 23 mmol/L (ref 22–32)
CREATININE: 2.09 mg/dL — AB (ref 0.61–1.24)
CREATININE: 2.61 mg/dL — AB (ref 0.61–1.24)
Chloride: 105 mmol/L (ref 101–111)
Chloride: 104 mmol/L (ref 101–111)
GFR calc Af Amer: 39 mL/min — ABNORMAL LOW (ref >60)
GFR calc Af Amer: 29 mL/min — ABNORMAL LOW (ref >60)
GFR calc non Af Amer: 25 mL/min — ABNORMAL LOW (ref >60)
GFR, EST NON AFRICAN AMERICAN: 33 mL/min — AB (ref >60)
GLUCOSE: 131 mg/dL — AB (ref 65–99)
Glucose, Bld: 123 mg/dL — ABNORMAL HIGH (ref 65–99)
PHOSPHORUS: 2.5 mg/dL (ref 2.5–4.6)
PHOSPHORUS: 3.2 mg/dL (ref 2.5–4.6)
Potassium: 3.6 mmol/L (ref 3.5–5.1)
Potassium: 5.8 mmol/L — ABNORMAL HIGH (ref 3.5–5.1)
SODIUM: 135 mmol/L (ref 135–145)
Sodium: 137 mmol/L (ref 135–145)

## 2016-06-12 LAB — GLUCOSE, CAPILLARY
Glucose-Capillary: 110 mg/dL — ABNORMAL HIGH (ref 65–99)
Glucose-Capillary: 149 mg/dL — ABNORMAL HIGH (ref 65–99)
Glucose-Capillary: 243 mg/dL — ABNORMAL HIGH (ref 65–99)
Glucose-Capillary: 137 mg/dL — ABNORMAL HIGH (ref 65–99)
Glucose-Capillary: 180 mg/dL — ABNORMAL HIGH (ref 65–99)
Glucose-Capillary: 244 mg/dL — ABNORMAL HIGH (ref 65–99)

## 2016-06-12 LAB — CARBOXYHEMOGLOBIN
CARBOXYHEMOGLOBIN: 1.3 % (ref 0.5–1.5)
METHEMOGLOBIN: 1.1 % (ref 0.0–1.5)
O2 Saturation: 68.6 %
TOTAL HEMOGLOBIN: 10.7 g/dL — AB (ref 12.0–16.0)

## 2016-06-12 LAB — MAGNESIUM: MAGNESIUM: 2.5 mg/dL — AB (ref 1.7–2.4)

## 2016-06-12 MED ORDER — CARVEDILOL 3.125 MG PO TABS
3.1250 mg | ORAL_TABLET | Freq: Two times a day (BID) | ORAL | Status: DC
  Administered 2016-06-12 – 2016-06-14 (×2): 3.125 mg via ORAL
  Filled 2016-06-12 (×4): qty 1

## 2016-06-12 MED ORDER — DARBEPOETIN ALFA 100 MCG/0.5ML IJ SOSY
100.0000 ug | PREFILLED_SYRINGE | INTRAMUSCULAR | Status: DC
  Administered 2016-06-13 – 2016-06-22 (×2): 100 ug via INTRAVENOUS
  Filled 2016-06-12 (×2): qty 0.5

## 2016-06-12 MED ORDER — DARBEPOETIN ALFA 100 MCG/0.5ML IJ SOSY: 100.0000 ug | PREFILLED_SYRINGE | INTRAMUSCULAR | Status: DC

## 2016-06-12 NOTE — Consult Note (Signed)
Referred by:  Dr. Jimmy Footman (Nephrology)  Reason for referral: tunneled dialysis catheter placement   History of Present Illness  Kenneth [name] is a 60 y.o. (August 08, 1956) male previous CKD Stage IV who presents for [facility] placement.  Pt is s/p CABG x 3v on 06/05/16.  Pt was already on CVVHD preop.  He had a R SCV trivascular catheter placed intraop.    Past Medical History:  CAD  Pulmonary edema  MT defomity  Keratoma  Diabetic microangiopathy  Ischemic rest pain  B DVT  DM  Anemia  CKD Stage IV  Past Surgical History:  Procedure Laterality Date  . CARDIAC CATHETERIZATION N/A 05/31/2016   Procedure: Left Heart Cath and Coronary Angiography;  Surgeon: Jettie Booze, MD;  Location: [place] [facility];  Service: Cardiovascular;  Laterality: N/A;  . CARDIAC CATHETERIZATION N/A 05/31/2016   Procedure: Right Heart Cath;  Surgeon: Jettie Booze, MD;  Location: [place] [facility];  Service: Cardiovascular;  Laterality: N/A;  . CARDIAC CATHETERIZATION N/A 05/31/2016   Procedure: IABP Insertion;  Surgeon: Jettie Booze, MD;  Location: [place] [facility];  Service: Cardiovascular;  Laterality: N/A;  . CORONARY ARTERY BYPASS GRAFT N/A 06/05/2016   Procedure: CORONARY ARTERY BYPASS GRAFTING (CABG) x3 LIMA to LAD -SVG to OM -SVG to RCA;  Surgeon: Ivin Poot, MD;  Location: [place];  Service: Open Heart Surgery;  Laterality: N/A;  . INSERTION OF DIALYSIS CATHETER N/A 06/05/2016   Procedure: INSERTION OF DIALYSIS/trialysis CATHETER;  Surgeon: Ivin Poot, MD;  Location: [place];  Service: Vascular;  Laterality: N/A;  . INTRAOPERATIVE TRANSESOPHAGEAL ECHOCARDIOGRAM N/A 06/05/2016   Procedure: INTRAOPERATIVE TRANSESOPHAGEAL ECHOCARDIOGRAM;  Surgeon: Ivin Poot, MD;  Location: [place];  Service: Open Heart Surgery;  Laterality: N/A;    Social History   Social History  . Marital status: Unknown    Spouse name: N/A  . Number of children: N/A  . Years of education:  N/A   Occupational History  . Not on file.   Social History Main Topics  . Smoking status: Former Research scientist (life sciences)  . Smokeless tobacco: Never Used  . Alcohol use Not on file  . Drug use: Unknown  . Sexual activity: Not on file   Other Topics Concern  . Not on file   Social History Narrative  . No narrative on file    [facility]: Language barrier limits the ability to get family history  Current Facility-Administered Medications  Medication Dose Route Frequency Provider Last Rate Last Dose  . 0.9 %  sodium chloride infusion  250 mL Intravenous Continuous Erin R Barrett, PA-C 1 mL/hr at 06/12/16 0741 250 mL at 06/12/16 0741  . amiodarone (PACERONE) tablet 400 mg  400 mg Oral BID Larey Dresser, MD   400 mg at 06/12/16 0914  . aspirin EC tablet 325 mg  325 mg Oral Daily Erin R Barrett, PA-C   325 mg at 06/12/16 9326   Or  . aspirin chewable tablet 324 mg  324 mg Per Tube Daily Erin R Barrett, PA-C   324 mg at 06/09/16 0909  . atorvastatin (LIPITOR) tablet 80 mg  80 mg Oral q1800 Larey Dresser, MD   80 mg at 06/11/16 1803  . bisacodyl (DULCOLAX) EC tablet 10 mg  10 mg Oral Daily Erin R Barrett, PA-C   10 mg at 06/10/16 7124   Or  . bisacodyl (DULCOLAX) suppository 10 mg  10 mg Rectal Daily Erin R Barrett, PA-C  10 mg at 06/08/16 0938  . carvedilol (COREG) tablet 3.125 mg  3.125 mg Oral BID WC Larey Dresser, MD   3.125 mg at 06/12/16 0914  . chlorhexidine (PERIDEX) 0.12 % solution 15 mL  15 mL Mouth Rinse BID Ivin Poot, MD   15 mL at 06/12/16 0915  . [START ON 06/13/2016] Darbepoetin Alfa (ARANESP) injection 100 mcg  100 mcg Intravenous Q Sat-HD Mauricia Area, MD      . docusate sodium (COLACE) capsule 200 mg  200 mg Oral Daily Erin R Barrett, PA-C   200 mg at 06/10/16 0908  . ferumoxytol (FERAHEME) 510 mg in sodium chloride 0.9 % 100 mL IVPB  510 mg Intravenous Weekly Mauricia Area, MD   510 mg at 06/08/16 0958  . heparin injection 1,000-6,000 Units  1,000-6,000 Units CRRT PRN Jamal Maes, MD   2,600 Units at 06/12/16 0906  . heparin injection 5,000 Units  5,000 Units Subcutaneous Q12H Ivin Poot, MD   5,000 Units at 06/12/16 0915  . Influenza vac split quadrivalent PF (FLUARIX) injection 0.5 mL  0.5 mL Intramuscular Prior to discharge Raylene Miyamoto, MD      . insulin aspart (novoLOG) injection 0-15 Units  0-15 Units Subcutaneous Q4H Gaye Pollack, MD   5 Units at 06/12/16 1145  . insulin detemir (LEVEMIR) injection 6 Units  6 Units Subcutaneous BID Ivin Poot, MD   6 Units at 06/12/16 0915  . isosorbide mononitrate (IMDUR) 24 hr tablet 15 mg  15 mg Oral Daily Satira Mccallum Tillery, PA-C   15 mg at 06/12/16 0914  . labetalol (NORMODYNE,TRANDATE) injection 10 mg  10 mg Intravenous Q2H PRN Ivin Poot, MD   10 mg at 06/12/16 0739  . MEDLINE mouth rinse  15 mL Mouth Rinse q12n4p Ivin Poot, MD   15 mL at 06/11/16 1200  . morphine 2 MG/ML injection 2-5 mg  2-5 mg Intravenous Q1H PRN Erin R Barrett, PA-C   2 mg at 06/12/16 0947  . ondansetron (ZOFRAN) injection 4 mg  4 mg Intravenous Q6H PRN Erin R Barrett, PA-C      . oxyCODONE (Oxy IR/ROXICODONE) immediate release tablet 5-10 mg  5-10 mg Oral Q3H PRN Erin R Barrett, PA-C      . pantoprazole (PROTONIX) EC tablet 40 mg  40 mg Oral Q1200 Ivin Poot, MD   40 mg at 06/12/16 1145  . prismasol BGK 4/2.5 5,000 mL dialysis replacement fluid   CRRT Continuous Jamal Maes, MD 400 mL/hr at 06/12/16 0831    . prismasol BGK 4/2.5 5,000 mL dialysis replacement fluid   CRRT Continuous Jamal Maes, MD 200 mL/hr at 06/10/16 2044    . prismasol BGK 4/2.5 5,000 mL dialysis solution   CRRT Continuous Jamal Maes, MD 1,000 mL/hr at 06/12/16 0415    . sodium chloride 0.9 % primer fluid for CRRT   CRRT PRN Jamal Maes, MD      . sodium chloride flush (NS) 0.9 % injection 10-40 mL  10-40 mL Intracatheter Q12H Ivin Poot, MD   10 mL at 06/12/16 0915  . sodium chloride flush (NS) 0.9 % injection 10-40 mL   10-40 mL Intracatheter PRN Ivin Poot, MD      . sodium chloride flush (NS) 0.9 % injection 3 mL  3 mL Intravenous Q12H Erin R Barrett, PA-C   3 mL at 06/12/16 0915  . sodium chloride flush (NS) 0.9 % injection 3 mL  3  mL Intravenous PRN Erin R Barrett, PA-C      . traMADol (ULTRAM) tablet 50-100 mg  50-100 mg Oral Q4H PRN Erin R Barrett, PA-C        Allergies  Allergen Reactions  . Hydralazine Rash    REVIEW OF SYSTEMS:   Cardiac:  positive for: Shortness of breath upon exertion, negative for: Chest pain or chest pressure and Shortness of breath when lying flat,   Vascular:  positive for: Pain in calf, thigh, or hip brought on by ambulation,  negative for: Pain in feet at night that wakes you up from your sleep, Blood clot in your veins and Leg swelling  Pulmonary:  positive for: no symptoms,  negative for: Oxygen at home, Productive cough and Wheezing  Neurologic:  positive for: Problems with dizziness, negative for: Sudden weakness in arms or legs, Sudden numbness in arms or legs, Sudden onset of difficulty speaking or slurred speech, Temporary loss of vision in one eye and Problems with dizziness  Gastrointestinal:  positive for: no symptoms, negative for: Blood in stool and Vomited blood  Genitourinary:  positive for: no symptoms, negative for: Burning when urinating and Blood in urine  Psychiatric:  positive for: no symptoms,  negative for: Major depression  Hematologic:  positive for: no symptoms,  negative for: negative for: Bleeding problems and Problems with blood clotting too easily  Dermatologic:  positive for: no symptoms, negative for: Rashes or ulcers  Constitutional:  positive for: no symptoms, negative for: Fever or chills   Physical Examination  Vitals:   06/12/16 1124 06/12/16 1200 06/12/16 1300 06/12/16 1400  BP:  (!) 104/51 (!) 107/58 (!) 103/56  Pulse:  70 70 60  Resp:  (!) 22 (!) 22 20  Temp: 98.4 F (36.9 C)     TempSrc: Oral      SpO2:  95% 96% 98%  Weight:      Height:        Body mass index is 21.56 kg/m.  General: Alert, O x 3, WD,painful grimace with movement  Head: [place]/AT,   Ear/Nose/Throat: Hearing grossly intact, nares without erythema or drainage, oropharynx without Erythema or Exudate , Mallampati score: 3, Dentition intact  Eyes: PERRLA, EOMI,   Neck: Supple, mid-line trachea,  LIJ CVC  Pulmonary: Sym exp, good B air movt,rales on BLL, R SCV trivascular catheter  Cardiac: RRR, Nl S1, S2, no Murmurs, No rubs, No S3,S4, sternal inc c/d/i  Vascular: Vessel Right Left  Radial Palpable Palpable  Brachial Palpable Palpable   Laboratory: CBC:    Component Value Date/Time   WBC 12.4 (H) 06/12/2016 0428   RBC 3.66 (L) 06/12/2016 0428   HGB 10.7 (L) 06/12/2016 0428   HCT 32.8 (L) 06/12/2016 0428   PLT 266 06/12/2016 0428   MCV 89.6 06/12/2016 0428   MCH 29.2 06/12/2016 0428   MCHC 32.6 06/12/2016 0428   RDW 14.4 06/12/2016 0428    BMP:    Component Value Date/Time   NA 138 06/12/2016 0428   K 3.6 06/12/2016 0428   CL 103 06/12/2016 0428   CO2 25 06/12/2016 0428   GLUCOSE 119 (H) 06/12/2016 0428   BUN 20 06/12/2016 0428   CREATININE 2.13 (H) 06/12/2016 0428   CALCIUM 8.1 (L) 06/12/2016 0428   GFRNONAA 32 (L) 06/12/2016 0428   GFRAA 38 (L) 06/12/2016 0428    Coagulation: Lab Results  Component Value Date   INR 1.10 06/12/2016   INR 1.47 06/06/2016   INR 0.96 05/31/2016  No results found for: PTT  Lipids:    Component Value Date/Time   TRIG 132 06/03/2016 1609    Radiology: Dg Chest Port 1 View  Result Date: 06/11/2016 CLINICAL DATA:  Chest soreness.  CABG EXAM: PORTABLE CHEST 1 VIEW COMPARISON:  Yesterday FINDINGS: Stable positioning of central lines, including left IJ catheter with tip at the distal left brachiocephalic vein. Left chest tube has been removed without pneumothorax. Unchanged basilar atelectasis, greater on the right with probable small effusion.  Stable postoperative heart size. CABG changes. IMPRESSION: 1. Interval removal of left chest tube without pneumothorax. 2. Stable bibasilar atelectasis. Electronically Signed   By: Monte Fantasia M.D.   On: 06/11/2016 07:32     Medical Decision Making  Kenneth [name] is a 60 y.o. male who presents with Acute on chronic renal disease stage IV, s/p CABG   Nephrology request placement of tunneled dialysis catheter.  Will plan on RIJV TDC placement tomorrow if possible, I.e. non-thrombosis of R internal jugular vein  The patient is aware the risks of tunneled dialysis catheter placement include but are not limited to: bleeding, infection, central venous injury, pneumothorax, possible venous stenosis, possible malpositioning in the venous system, and possible infections related to long-term catheter presence.  The patient was aware of these risks and agreed to proceed.   Adele Barthel, MD [facility] Office: [phone] Pager: [phone]  06/12/2016, 3:06 PM

## 2016-06-12 NOTE — Care Management Note (Addendum)
Case Management Note  Patient Details  Name: Kenneth [name] MRN: [id number] Date of Birth: 1956-05-01  Subjective/Objective:     Pt s/p CABG   - currently on CRRT               Action/Plan:  Pt is from home with wife.  Recommendations for CIR - CSW consulted for back up plan    Expected Discharge Date:                  Expected Discharge Plan:  [place]  In-House Referral:     Discharge planning Services     Post Acute Care Choice:    Choice offered to:     DME Arranged:    DME Agency:     HH Arranged:    [place] Agency:     Status of Service:  In process, will continue to follow  If discussed at Long Length of Stay Meetings, dates discussed:    Additional Comments:  Maryclare Labrador, RN 06/12/2016, 9:51 AM

## 2016-06-12 NOTE — Plan of Care (Signed)
Problem: Activity: Goal: Risk for activity intolerance will decrease Outcome: Progressing Pt up OOB x2 06/11/16   Problem: Bowel/Gastric: Goal: Gastrointestinal status for postoperative course will improve Outcome: Progressing Pt has had several BM  Problem: Cardiac: Goal: Hemodynamic stability will improve Outcome: Progressing Back in NSR. No drips.  Problem: Nutritional: Goal: Risk for body nutrition deficit will decrease Outcome: Progressing Pt started on dysphagia 2 diet.  Problem: Respiratory: Goal: Levels of oxygenation will improve Outcome: Progressing WNL on Room air  Problem: Pain Management: Goal: Pain level will decrease Outcome: Progressing Minimal complaints of pain  Problem: Urinary Elimination: Goal: Ability to achieve and maintain adequate renal perfusion and functioning will improve Outcome: Not Progressing Pt remains anuric. On CRRT

## 2016-06-12 NOTE — Progress Notes (Signed)
Patient ID: Kenneth [name], male   DOB: March 01, 1956, 60 y.o.   MRN: [id number] TCTS DAILY ICU PROGRESS NOTE                   [address]            [place] [address]          [phone]   7 Days Post-Op Procedure(s) (LRB): CORONARY ARTERY BYPASS GRAFTING (CABG) x3 LIMA to LAD -SVG to OM -SVG to RCA (N/A) INTRAOPERATIVE TRANSESOPHAGEAL ECHOCARDIOGRAM (N/A) INSERTION OF DIALYSIS/trialysis CATHETER (N/A)  Total Length of Stay:  LOS: 12 days   Subjective: Opens eyes and follows commands   Objective: Vital signs in last 24 hours: Temp:  [97.7 F (36.5 C)-98.8 F (37.1 C)] 97.7 F (36.5 C) (09/29 0735) Pulse Rate:  [57-86] 57 (09/29 0800) Cardiac Rhythm: Normal sinus rhythm (09/29 0800) Resp:  [11-25] 13 (09/29 0800) BP: (132-165)/(56-77) 137/75 (09/29 0800) SpO2:  [92 %-97 %] 97 % (09/29 0800) Weight:  [133 lb 9.6 oz (60.6 kg)] 133 lb 9.6 oz (60.6 kg) (09/29 0500)  Filed Weights   06/10/16 0500 06/11/16 0400 06/12/16 0500  Weight: 145 lb 8.1 oz (66 kg) 143 lb 8.3 oz (65.1 kg) 133 lb 9.6 oz (60.6 kg)    Weight change: -9 lb 14.7 oz (-4.5 kg)   Hemodynamic parameters for last 24 hours: CVP:  [5 mmHg] 5 mmHg  Intake/Output from previous day: 09/28 0701 - 09/29 0700 In: 783.4 [P.O.:220; I.V.:241.4; IV Piggyback:322] Out: 2887 [Urine:20]  Intake/Output this shift: Total I/O In: 7.2 [I.V.:7.2] Out: 104 [Other:104]  Current Meds: Scheduled Meds: . amiodarone  400 mg Oral BID  . aspirin EC  325 mg Oral Daily   Or  . aspirin  324 mg Per Tube Daily  . atorvastatin  80 mg Oral q1800  . bisacodyl  10 mg Oral Daily   Or  . bisacodyl  10 mg Rectal Daily  . carvedilol  3.125 mg Oral BID WC  . chlorhexidine  15 mL Mouth Rinse BID  . [START ON 06/13/2016] darbepoetin (ARANESP) injection - DIALYSIS  100 mcg Intravenous Q Sat-HD  . docusate sodium  200 mg Oral Daily  . ferumoxytol  510 mg Intravenous Weekly  . heparin subcutaneous  5,000 Units Subcutaneous  Q12H  . insulin aspart  0-15 Units Subcutaneous Q4H  . insulin detemir  6 Units Subcutaneous BID  . isosorbide mononitrate  15 mg Oral Daily  . mouth rinse  15 mL Mouth Rinse q12n4p  . pantoprazole  40 mg Oral Q1200  . sodium chloride flush  10-40 mL Intracatheter Q12H  . sodium chloride flush  3 mL Intravenous Q12H   Continuous Infusions: . sodium chloride 250 mL (06/12/16 0741)  . dialysis replacement fluid (prismasate) 400 mL/hr at 06/12/16 0831  . dialysis replacement fluid (prismasate) 200 mL/hr at 06/10/16 2044  . dialysate (PRISMASATE) 1,000 mL/hr at 06/12/16 0415   PRN Meds:.heparin, Influenza vac split quadrivalent PF, labetalol, morphine injection, ondansetron (ZOFRAN) IV, oxyCODONE, sodium chloride, sodium chloride flush, sodium chloride flush, traMADol  General appearance: alert, cooperative and appears older than stated age Neurologic: intact Heart: regular rate and rhythm, S1, S2 normal, no murmur, click, rub or gallop Lungs: diminished breath sounds bibasilar Abdomen: soft, non-tender; bowel sounds normal; no masses,  no organomegaly Extremities: extremities normal, atraumatic, no cyanosis or edema and Homans sign is negative, no sign of DVT Wound: sternum stable wound intact  Lab Results: CBC:  Recent Labs  06/11/16 0448 06/12/16 0428  WBC 16.1* 12.4*  HGB 11.4* 10.7*  HCT 35.1* 32.8*  PLT 245 266   BMET:   Recent Labs  06/12/16 0410 06/12/16 0428  NA 137 138  K 3.6 3.6  CL 105 103  CO2 25 25  GLUCOSE 123* 119*  BUN 20 20  CREATININE 2.09* 2.13*  CALCIUM 8.1* 8.1*    CMET: Lab Results  Component Value Date   WBC 12.4 (H) 06/12/2016   HGB 10.7 (L) 06/12/2016   HCT 32.8 (L) 06/12/2016   PLT 266 06/12/2016   GLUCOSE 119 (H) 06/12/2016   TRIG 132 06/03/2016   ALT 44 06/12/2016   AST 48 (H) 06/12/2016   NA 138 06/12/2016   K 3.6 06/12/2016   CL 103 06/12/2016   CREATININE 2.13 (H) 06/12/2016   BUN 20 06/12/2016   CO2 25 06/12/2016   INR  1.10 06/12/2016   HGBA1C 10.4 (H) 06/04/2016    PT/INR:   Recent Labs  06/12/16 0428  LABPROT 14.3  INR 1.10   Radiology: No results found.   CoOX 68  Assessment/Plan: S/P Procedure(s) (LRB): CORONARY ARTERY BYPASS GRAFTING (CABG) x3 LIMA to LAD -SVG to OM -SVG to RCA (N/A) INTRAOPERATIVE TRANSESOPHAGEAL ECHOCARDIOGRAM (N/A) INSERTION OF DIALYSIS/trialysis CATHETER (N/A) Mobilize Diabetes control Pt consult Remains on cvvh chf improving  Renal failure limiting factor currently     Kenneth [name] 06/12/2016 8:47 AM

## 2016-06-12 NOTE — Progress Notes (Signed)
Patient ID: Kenneth [name], male   DOB: [birth year], 60 y.o.   MRN: [id number]     Advanced Heart Failure Rounding Note  Referring Physician: Raylene Miyamoto, MD  Primary Physician: Rodena Medin, MD Primary HF: New ( Dr Aundra Dubin)  Reason for Consultation: Acute respiratory failure  Subjective:    Kenneth [name] is a 60 y.o. male with hx of poorly controlled diabetes, HTN, CKD IV, and HLD.  Admitted 05/31/16 with orthopnea, DOE, and CP. EKG did not meet stemi criteria. Troponin 1.4. Echo showed global LV dysfunction. Started on NIMV for acute respiratory failure, failed requiring intubation.   Echo 05/31/16 LVEF 35-40%, Grade 2 DD  LHC 05/31/16 showed LM lesion, 40 %stenosed, Ramus lesion, 80 %stenosed, Ost Cx to Prox Cx lesion, 80 %stenosed, Mid LAD lesion, 90 %stenosed, Ost 2nd Diag to 2nd Diag lesion, 90 %stenosed, Prox RCA to Mid RCA lesion, 100 %stenosed. IABP inserted.   Atrial fibrillation with RVR developed 9/18.  Thoracentesis on right on 9/19,  transudative (1000 cc off).   Thoracentesis on left on 9/21, 600 cc off.   Underwent CABG x 3. 9/22  IABP removed 06/07/16. Extubated 06/09/16. Coox 69% now off milrinone.  Remains on CVVH, CVP 4-5 today.  NSR with amiodarone.    No dyspnea.   Objective:   Weight Range: 133 lb 9.6 oz (60.6 kg) Body mass index is 21.56 kg/m.   Vital Signs:   Temp:  [97.7 F (36.5 C)-98.8 F (37.1 C)] 97.7 F (36.5 C) (09/29 0735) Pulse Rate:  [70-86] 72 (09/29 0700) Resp:  [11-25] 20 (09/29 0700) BP: (132-165)/(56-77) 165/77 (09/29 0700) SpO2:  [92 %-97 %] 95 % (09/29 0700) Weight:  [133 lb 9.6 oz (60.6 kg)] 133 lb 9.6 oz (60.6 kg) (09/29 0500) Last BM Date: 06/11/16  Weight change: Filed Weights   06/10/16 0500 06/11/16 0400 06/12/16 0500  Weight: 145 lb 8.1 oz (66 kg) 143 lb 8.3 oz (65.1 kg) 133 lb 9.6 oz (60.6 kg)    Intake/Output:   Intake/Output Summary (Last 24 hours) at 06/12/16 0807 Last data filed at 06/12/16 0800  Gross per 24  hour  Intake              686 ml  Output             2827 ml  Net            -2141 ml     Physical Exam: CVP 2-3 General:  Extubated. Stable. Fatigued appearing HEENT: Normal Neck: supple. JVP flat. Carotids 2+ bilat; Cor: Sternal dressing RRR. No M/G/R.  Lungs: Diminished throughout   Abdomen: MIldly distended. Extremities: no cyanosis, clubbing, rash. Trace ankle edema. R groin IABP stable.  Neuro: alert & orientedx3, cranial nerves grossly intact. moves all 4 extremities w/o difficulty. Affect pleasant  Telemetry: Reviewed, NSR this morning  Labs: CBC  Recent Labs  06/11/16 0448 06/12/16 0428  WBC 16.1* 12.4*  HGB 11.4* 10.7*  HCT 35.1* 32.8*  MCV 89.1 89.6  PLT 245 308   Basic Metabolic Panel  Recent Labs  06/11/16 0448 06/11/16 1532 06/12/16 0410 06/12/16 0428  NA 139  139 135 137 138  K 3.8  3.8 3.9 3.6 3.6  CL 107  106 103 105 103  CO2 23  23 24 25 25   GLUCOSE 110*  108* 244* 123* 119*  BUN 22*  23* 21* 20 20  CREATININE 2.29*  2.27* 2.25* 2.09* 2.13*  CALCIUM 8.1*  8.0* 8.1* 8.1*  8.1*  MG 2.7*  --   --  2.5*  PHOS 2.3* 4.3 2.5  --    Liver Function Tests  Recent Labs  06/11/16 0448  06/12/16 0410 06/12/16 0428  AST 38  --   --  48*  ALT 38  --   --  44  ALKPHOS 138*  --   --  149*  BILITOT 1.1  --   --  1.0  PROT 5.2*  --   --  5.2*  ALBUMIN 2.0*  2.0*  < > 2.0* 2.0*  < > = values in this interval not displayed. No results for input(s): LIPASE, AMYLASE in the last 72 hours. Cardiac Enzymes No results for input(s): CKTOTAL, CKMB, CKMBINDEX, TROPONINI in the last 72 hours.  BNP: BNP (last 3 results)  Recent Labs  05/31/16 1435  BNP 2,537.9*    ProBNP (last 3 results) No results for input(s): PROBNP in the last 8760 hours.   D-Dimer No results for input(s): DDIMER in the last 72 hours. Hemoglobin A1C No results for input(s): HGBA1C in the last 72 hours. Fasting Lipid Panel No results for input(s): CHOL, HDL, LDLCALC,  TRIG, CHOLHDL, LDLDIRECT in the last 72 hours. Thyroid Function Tests No results for input(s): TSH, T4TOTAL, T3FREE, THYROIDAB in the last 72 hours.  Invalid input(s): FREET3  Other results:     Imaging/Studies:  Dg Chest Port 1 View  Result Date: 06/11/2016 CLINICAL DATA:  Chest soreness.  CABG EXAM: PORTABLE CHEST 1 VIEW COMPARISON:  Yesterday FINDINGS: Stable positioning of central lines, including left IJ catheter with tip at the distal left brachiocephalic vein. Left chest tube has been removed without pneumothorax. Unchanged basilar atelectasis, greater on the right with probable small effusion. Stable postoperative heart size. CABG changes. IMPRESSION: 1. Interval removal of left chest tube without pneumothorax. 2. Stable bibasilar atelectasis. Electronically Signed   By: Monte Fantasia M.D.   On: 06/11/2016 07:32    Latest Echo  Latest Cath   Medications:     Scheduled Medications: . amiodarone  400 mg Oral BID  . aspirin EC  325 mg Oral Daily   Or  . aspirin  324 mg Per Tube Daily  . atorvastatin  80 mg Oral q1800  . bisacodyl  10 mg Oral Daily   Or  . bisacodyl  10 mg Rectal Daily  . carvedilol  3.125 mg Oral BID WC  . chlorhexidine  15 mL Mouth Rinse BID  . darbepoetin (ARANESP) injection - DIALYSIS  100 mcg Intravenous Q Fri-HD  . docusate sodium  200 mg Oral Daily  . ferumoxytol  510 mg Intravenous Weekly  . heparin subcutaneous  5,000 Units Subcutaneous Q12H  . insulin aspart  0-15 Units Subcutaneous Q4H  . insulin detemir  6 Units Subcutaneous BID  . isosorbide mononitrate  15 mg Oral Daily  . mouth rinse  15 mL Mouth Rinse q12n4p  . pantoprazole  40 mg Oral Q1200  . sodium chloride flush  10-40 mL Intracatheter Q12H  . sodium chloride flush  3 mL Intravenous Q12H    Infusions: . sodium chloride 250 mL (06/12/16 0741)  . dialysis replacement fluid (prismasate) 400 mL/hr at 06/11/16 1945  . dialysis replacement fluid (prismasate) 200 mL/hr at  06/10/16 2044  . dialysate (PRISMASATE) 1,000 mL/hr at 06/12/16 0415    PRN Medications: heparin, Influenza vac split quadrivalent PF, labetalol, morphine injection, ondansetron (ZOFRAN) IV, oxyCODONE, sodium chloride, sodium chloride flush, sodium chloride flush, traMADol   Assessment/Plan  Kenneth [name] is a 60 y.o. male with hx of poorly controlled diabetes, HTN, CKD IV, and HLD.  Admitted 05/31/16 with orthopnea, DOE, and CP.  Intubated, sedated, placed on IABP.  S/p CABG x 3 9/22 (LIMA-LAD, SVG-OM, SVG-RCA)  1. Acute on chronic systolic CHF with cardiogenic shock: EF 35-40% with wall motion abnormalities, ischemic cardiomyopathy.  IABP out. s/p CABG x 3 on 9/22. Co-ox 69% with CVP 4-5 today.  Now off milrinone.  - Will be transitioning to HD.  - Can leave off milrinone.  - Started hydralazine/Imdur but had rash that may be related to hydralazine so it was stopped.  - Would use Coreg 3.125 mg bid instead of metoprolol.  2. CAD: Admitted with NSTEMI, severe 3VD. S/p 3v CABG 9/22 - ASA, atorvastatin 80.  3. AKI on CKD stage IV:  On CVVH now. Baseline creatinine seems to be around 2.5.    - Renal following, to transition to HD.  4. Acute respiratory failure: right thoracentesis and left thoracentesis done.   - Failed wean 06/07/16 as went into Afib RVR. Extubated 9/37/90 without complication.  5. Anemia: Hgb stable.  6. ID: Remains afebrile with Serratia in sputum. Completed abx.  7. Atrial fibrillation: Paroxysmal. Now in NSR.    - Continue amiodarone 400 mg bid.  - He will need to begin on warfarin eventually after more permanent access for HD placed.  - Discontinuing milrinone as above should decrease drive to go into atrial fibrillation.   Length of Stay: [address]  06/12/2016, 8:07 AM  Advanced Heart Failure Team Pager [phone] (M-F; 7a - 4p)  Please contact [facility] for night-coverage after hours (4p -7a ) and weekends on [email or web address]

## 2016-06-12 NOTE — Progress Notes (Signed)
      [address]       [name] [address]             [phone]      Comfortable  BP (!) 110/55   Pulse 60   Temp 98.1 F (36.7 C) (Oral)   Resp 15   Ht 5\' 6"  (1.676 m)   Wt 133 lb 9.6 oz (60.6 kg)   SpO2 93%   BMI 21.56 kg/m    Intake/Output Summary (Last 24 hours) at 06/12/16 1933 Last data filed at 06/12/16 1400  Gross per 24 hour  Intake           290.15 ml  Output             1278 ml  Net          -987.85 ml    CVVH dc'ed for tunneled catheter then intermittent HD  Remo Lipps C. Roxan Hockey, MD [facility] [phone]

## 2016-06-12 NOTE — Evaluation (Signed)
Occupational Therapy Evaluation Patient Details Name: Kenneth [name] MRN: [id number] DOB: 1956-02-13 Today's Date: 06/12/2016    History of Present Illness Pt adm with SOB and required emergent intubation and cardiac cath. Underwent CABG on 06/05/16. Requiring CRRT post op, discontinued 9/29. PMH - DM, CKD, HTN   Clinical Impression   Pt with fatigue today. Assist of 2, primarily for safety and lines to take 3 steps with walker and then sit in chair. Pt presents with generalized weakness, impaired cognition, decreased activity tolerance and dependence in ADL. With intensive rehab, pt has potential to return home at a supervision level. Will follow acutely.    Follow Up Recommendations  CIR;Supervision/Assistance - 24 hour    Equipment Recommendations  3 in 1 bedside comode    Recommendations for Other Services       Precautions / Restrictions Precautions Precautions: Fall Precaution Comments: multiple lines Restrictions Weight Bearing Restrictions: Yes (sternal precautions)      Mobility Bed Mobility Overal bed mobility: Needs Assistance;+2 for physical assistance Bed Mobility: Supine to Sit     Supine to sit: +2 for physical assistance;Total assist     General bed mobility comments: Assist to elevate trunk into sitting and to bring hips to EOB  Transfers Overall transfer level: Needs assistance Equipment used: Rolling walker (2 wheeled) Transfers: Sit to/from Stand Sit to Stand: Min assist;+2 safety/equipment         General transfer comment: assist for rising from bed and to steady    Balance Overall balance assessment: Needs assistance Sitting-balance support: Feet supported Sitting balance-Leahy Scale: Poor Sitting balance - Comments: flexed posture     Standing balance-Leahy Scale: Poor Standing balance comment: sits abruptly                            ADL Overall ADL's : Needs assistance/impaired Eating/Feeding: Set up;Bed level    Grooming: Wash/dry hands;Wash/dry face;Bed level;Minimal assistance   Upper Body Bathing: Total assistance;Sitting   Lower Body Bathing: Total assistance;+2 for safety/equipment;Sit to/from stand   Upper Body Dressing : Maximal assistance;Sitting   Lower Body Dressing: Total assistance;+2 for safety/equipment;Sit to/from stand   Toilet Transfer: Minimal assistance;RW;Ambulation;BSC;+2 for safety/equipment   Toileting- Clothing Manipulation and Hygiene: Total assistance;Sit to/from stand       Functional mobility during ADLs: +2 for safety/equipment;Minimal assistance;Rolling walker;Cueing for safety General ADL Comments: Pt c/o fatigue, difficulty keeping eyes open.     Vision Additional Comments: pt functionally, able to see breakfast tray, did not formally assess   Perception     Praxis      Pertinent Vitals/Pain Pain Assessment: 0-10 Pain Score: 5  Pain Location: incision/chest Pain Descriptors / Indicators: Sore Pain Intervention(s): Patient requesting pain meds-RN notified;RN gave pain meds during session     Hand Dominance Right   Extremity/Trunk Assessment Upper Extremity Assessment Upper Extremity Assessment: Generalized weakness   Lower Extremity Assessment Lower Extremity Assessment: Defer to PT evaluation       Communication Communication Communication: Prefers language other than [place] ([place], but [place] is functional)   Cognition Arousal/Alertness: Lethargic Behavior During Therapy: Flat affect Overall Cognitive Status: Impaired/Different from baseline Area of Impairment: Attention;Following commands;Problem solving;Memory   Current Attention Level: Focused Memory: Decreased recall of precautions Following Commands: Follows one step commands with increased time (and multimodal cues)     Problem Solving: Slow processing     General Comments       Exercises  Shoulder Instructions      Home Living Family/patient expects to be  discharged to:: Private residence Living Arrangements: Spouse/significant other;Children                                      Prior Functioning/Environment Level of Independence: Independent                 OT Problem List: Decreased strength;Decreased activity tolerance;Impaired balance (sitting and/or standing);Decreased coordination;Decreased cognition;Decreased knowledge of use of DME or AE;Decreased knowledge of precautions;Cardiopulmonary status limiting activity   OT Treatment/Interventions: Self-care/ADL training;Cognitive remediation/compensation;Therapeutic activities;DME and/or AE instruction;Patient/family education;Balance training    OT Goals(Current goals can be found in the care plan section) Acute Rehab OT Goals Patient Stated Goal: wants to sleep OT Goal Formulation: With patient Time For Goal Achievement: 06/26/16 Potential to Achieve Goals: Good ADL Goals Pt Will Perform Grooming: with supervision;standing Pt Will Perform Upper Body Dressing: with supervision;sitting Pt Will Perform Lower Body Dressing: with supervision;sit to/from stand Pt Will Transfer to Toilet: with supervision;ambulating;bedside commode (over toilet) Pt Will Perform Toileting - Clothing Manipulation and hygiene: with supervision;sit to/from stand Additional ADL Goal #1: Pt will generalize sternal precautions in ADL and mobility with supervision.  OT Frequency: Min 2X/week   Barriers to D/C:            Co-evaluation              End of Session Equipment Utilized During Treatment: Gait belt;Rolling walker Nurse Communication: Mobility status  Activity Tolerance: Patient limited by fatigue;Patient limited by lethargy Patient left: in chair;with call bell/phone within reach;with nursing/sitter in room   Time: 0936-1003 OT Time Calculation (min): 27 min Charges:  OT General Charges $OT Visit: 1 Procedure OT Evaluation $OT Eval Moderate Complexity: 1  Procedure OT Treatments $Self Care/Home Management : 8-22 mins G-Codes:    [name] 06/12/2016, 10:16 AM  [phone]

## 2016-06-12 NOTE — Progress Notes (Signed)
Speech Language Pathology Treatment: Dysphagia  Patient Details Name: Kenneth [name] MRN: [id number] DOB: 09/30/1955 Today's Date: 06/12/2016 Time: 1194-1740 SLP Time Calculation (min) (ACUTE ONLY): 20 min  Assessment / Plan / Recommendation Clinical Impression  Pt seen with lunch tray, consisting of Dys 2 textures and thin liquids. He is oriented to person and location, but disoriented to situation. He continues to have immediate throat clearing that follows thin liquids by straw, but with Min cues for small, single cup sips he has no overt signs of aspiration. His vocal quality is similar to previous date, and remains clear across intake. Pt does need frequent redirection to sustain attention to meal. Recommend to continue current diet with close monitoring. Requested that RN page SLP weekend pager [phone]) should any difficulties be noted over the weekend.   HPI HPI: Pt is 60 y.o.male with hx of poorly controlled diabetes, HTN, CKD IV, and HLD. Admitted 05/31/16 with orthopnea, DOE, and CP. EKG did not meet stemi criteria. Troponin 1.4. Echo showed global LV dysfunction. Started on NIMV for acute respiratory failure, failed requiring intubation on 9/17 and was extubated on 9/26.      SLP Plan  Continue with current plan of care     Recommendations  Diet recommendations: Dysphagia 2 (fine chop);Thin liquid Liquids provided via: Cup;No straw Medication Administration: Whole meds with puree Supervision: Staff to assist with self feeding;Full supervision/cueing for compensatory strategies Compensations: Minimize environmental distractions;Slow rate;Small sips/bites;Follow solids with liquid Postural Changes and/or Swallow Maneuvers: Seated upright 90 degrees;Upright 30-60 min after meal                Oral Care Recommendations: Oral care BID Follow up Recommendations: 24 hour supervision/assistance Plan: Continue with current plan of care       GO                 Kenneth [name] 06/12/2016, 4:40 PM  Kenneth [name], M.A. CCC-SLP [phone]

## 2016-06-12 NOTE — Progress Notes (Signed)
Inpatient Diabetes Program Recommendations  AACE/ADA: New Consensus Statement on Inpatient Glycemic Control (2015)  Target Ranges:  Prepandial:   less than 140 mg/dL      Peak postprandial:   less than 180 mg/dL (1-2 hours)      Critically ill patients:  140 - 180 mg/dL   Lab Results  Component Value Date   GLUCAP 243 (H) 06/12/2016   HGBA1C 10.4 (H) 06/04/2016    Review of Glycemic Control  Inpatient Diabetes Program Recommendations:    Please consider adding Novolog meal coverage 2 units tid and decrease Novolog correction to sensitive 0-9 units q 4 hrs.   Patient gives correction by pump @ home:    Breakfast 1 unit: 12    Lunch       1:10    Dinner       1:10  Thank you, Nani Gasser. Mayan Dolney, RN, MSN, CDE Inpatient Glycemic Control Team Team Pager [phone] (8am-5pm) 06/12/2016 1:33 PM

## 2016-06-12 NOTE — Progress Notes (Signed)
[employer] interpreter [phone] contacted for interpretation ref insertion of tunneled catheter in am.  Pt and spouse with language barrier and speak/understand minimal english.  All questions answered by pt and no further questions, consent signed and placed in chart.

## 2016-06-12 NOTE — Progress Notes (Signed)
   Daily Progress Note   Asked by Nephrology to place tunneled dialysis catheter.  Pt already ate so will schedule for tomorrow.  - will be by later to complete consult  Adele Barthel, MD, [facility] Office: [phone] Pager: [phone]  06/12/2016, 2:04 PM

## 2016-06-12 NOTE — Progress Notes (Signed)
Subjective: Interval History: has no complaint, rash better.  Objective: Vital signs in last 24 hours: Temp:  [98.5 F (36.9 C)-98.8 F (37.1 C)] 98.8 F (37.1 C) (09/29 0408) Pulse Rate:  [70-88] 70 (09/29 0600) Resp:  [11-25] 21 (09/29 0600) BP: (132-159)/(56-77) 142/71 (09/29 0600) SpO2:  [92 %-97 %] 94 % (09/29 0600) Weight:  [60.6 kg (133 lb 9.6 oz)] 60.6 kg (133 lb 9.6 oz) (09/29 0500) Weight change: -4.5 kg (-9 lb 14.7 oz)  Intake/Output from previous day: 09/28 0701 - 09/29 0700 In: 773.4 [P.O.:220; I.V.:231.4; IV Piggyback:322] Out: 2799 [Urine:20] Intake/Output this shift: Total I/O In: 210 [P.O.:100; I.V.:110] Out: 1086 [Other:1086]  General appearance: alert, cooperative and no distress Resp: rales bibasilar Chest wall: R AC cath Cardio: S1, S2 normal and systolic murmur: holosystolic 2/6, blowing at apex GI: soft, non-tender; bowel sounds normal; no masses,  no organomegaly Extremities: edema 2+  Lab Results:  Recent Labs  06/11/16 0448 06/12/16 0428  WBC 16.1* 12.4*  HGB 11.4* 10.7*  HCT 35.1* 32.8*  PLT 245 266   BMET:  Recent Labs  06/12/16 0410 06/12/16 0428  NA 137 138  K 3.6 3.6  CL 105 103  CO2 25 25  GLUCOSE 123* 119*  BUN 20 20  CREATININE 2.09* 2.13*  CALCIUM 8.1* 8.1*   No results for input(s): PTH in the last 72 hours. Iron Studies: No results for input(s): IRON, TIBC, TRANSFERRIN, FERRITIN in the last 72 hours.  Studies/Results: Dg Chest Port 1 View  Result Date: 06/11/2016 CLINICAL DATA:  Chest soreness.  CABG EXAM: PORTABLE CHEST 1 VIEW COMPARISON:  Yesterday FINDINGS: Stable positioning of central lines, including left IJ catheter with tip at the distal left brachiocephalic vein. Left chest tube has been removed without pneumothorax. Unchanged basilar atelectasis, greater on the right with probable small effusion. Stable postoperative heart size. CABG changes. IMPRESSION: 1. Interval removal of left chest tube without  pneumothorax. 2. Stable bibasilar atelectasis. Electronically Signed   By: Monte Fantasia M.D.   On: 06/11/2016 07:32    I have reviewed the patient's current medications.  Assessment/Plan: 1 AKI/CKD vol xs yet , good solute, acid/base/K.  Will transition to IHD, Get PC in .  Lower vol 2 Rash better 3 DM controlled 4 CAD/CABG 5 Anemia stable 6 HPth Address 7 Afib P Convert to IHD, get PC, esa, Fe, Vit D,  Mobilize. Map    LOS: 12 days   Rafaela Dinius L 06/12/2016,6:47 AM

## 2016-06-12 NOTE — Progress Notes (Addendum)
TCTS DAILY ICU PROGRESS NOTE                   [address]            [place] [address]          [phone]   7 Days Post-Op Procedure(s) (LRB): CORONARY ARTERY BYPASS GRAFTING (CABG) x3 LIMA to LAD -SVG to OM -SVG to RCA (N/A) INTRAOPERATIVE TRANSESOPHAGEAL ECHOCARDIOGRAM (N/A) INSERTION OF DIALYSIS/trialysis CATHETER (N/A)  Total Length of Stay:  LOS: 12 days   Subjective: Feels a bit better  Objective: Vital signs in last 24 hours: Temp:  [97.7 F (36.5 C)-98.8 F (37.1 C)] 97.7 F (36.5 C) (09/29 0735) Pulse Rate:  [70-88] 72 (09/29 0700) Cardiac Rhythm: Normal sinus rhythm (09/29 0400) Resp:  [11-25] 20 (09/29 0700) BP: (132-159)/(56-77) 142/71 (09/29 0600) SpO2:  [92 %-97 %] 95 % (09/29 0700) Weight:  [133 lb 9.6 oz (60.6 kg)] 133 lb 9.6 oz (60.6 kg) (09/29 0500)  Filed Weights   06/10/16 0500 06/11/16 0400 06/12/16 0500  Weight: 145 lb 8.1 oz (66 kg) 143 lb 8.3 oz (65.1 kg) 133 lb 9.6 oz (60.6 kg)    Weight change: -9 lb 14.7 oz (-4.5 kg)   Hemodynamic parameters for last 24 hours: CVP:  [5 mmHg] 5 mmHg  Intake/Output from previous day: 09/28 0701 - 09/29 0700 In: 773.4 [P.O.:220; I.V.:231.4; IV Piggyback:322] Out: 2799 [Urine:20]  Intake/Output this shift: No intake/output data recorded.  Current Meds: Scheduled Meds: . amiodarone  400 mg Oral BID  . aspirin EC  325 mg Oral Daily   Or  . aspirin  324 mg Per Tube Daily  . atorvastatin  80 mg Oral q1800  . bisacodyl  10 mg Oral Daily   Or  . bisacodyl  10 mg Rectal Daily  . chlorhexidine  15 mL Mouth Rinse BID  . darbepoetin (ARANESP) injection - DIALYSIS  100 mcg Intravenous Q Fri-HD  . docusate sodium  200 mg Oral Daily  . ferumoxytol  510 mg Intravenous Weekly  . heparin subcutaneous  5,000 Units Subcutaneous Q12H  . insulin aspart  0-15 Units Subcutaneous Q4H  . insulin detemir  6 Units Subcutaneous BID  . isosorbide mononitrate  15 mg Oral Daily  . mouth rinse  15 mL  Mouth Rinse q12n4p  . metoprolol tartrate  12.5 mg Oral BID  . pantoprazole  40 mg Oral Q1200  . sodium chloride flush  10-40 mL Intracatheter Q12H  . sodium chloride flush  3 mL Intravenous Q12H   Continuous Infusions: . sodium chloride 250 mL (06/10/16 0154)  . dialysis replacement fluid (prismasate) 400 mL/hr at 06/11/16 1945  . dialysis replacement fluid (prismasate) 200 mL/hr at 06/10/16 2044  . dialysate (PRISMASATE) 1,000 mL/hr at 06/12/16 0415   PRN Meds:.heparin, Influenza vac split quadrivalent PF, labetalol, morphine injection, ondansetron (ZOFRAN) IV, oxyCODONE, sodium chloride, sodium chloride flush, sodium chloride flush, traMADol  General appearance: alert, cooperative and no distress Heart: regular rate and rhythm Lungs: dim in bases Abdomen: benign Extremities: no edema Wound: incis healing well  Lab Results: CBC: Recent Labs  06/11/16 0448 06/12/16 0428  WBC 16.1* 12.4*  HGB 11.4* 10.7*  HCT 35.1* 32.8*  PLT 245 266   BMET:  Recent Labs  06/12/16 0410 06/12/16 0428  NA 137 138  K 3.6 3.6  CL 105 103  CO2 25 25  GLUCOSE 123* 119*  BUN 20 20  CREATININE 2.09* 2.13*  CALCIUM 8.1*  8.1*    CMET: Lab Results  Component Value Date   WBC 12.4 (H) 06/12/2016   HGB 10.7 (L) 06/12/2016   HCT 32.8 (L) 06/12/2016   PLT 266 06/12/2016   GLUCOSE 119 (H) 06/12/2016   TRIG 132 06/03/2016   ALT 44 06/12/2016   AST 48 (H) 06/12/2016   NA 138 06/12/2016   K 3.6 06/12/2016   CL 103 06/12/2016   CREATININE 2.13 (H) 06/12/2016   BUN 20 06/12/2016   CO2 25 06/12/2016   INR 1.10 06/12/2016   HGBA1C 10.4 (H) 06/04/2016    PT/INR:  Recent Labs  06/12/16 0428  LABPROT 14.3  INR 1.10   Radiology: No results found.   Assessment/Plan: S/P Procedure(s) (LRB): CORONARY ARTERY BYPASS GRAFTING (CABG) x3 LIMA to LAD -SVG to OM -SVG to RCA (N/A) INTRAOPERATIVE TRANSESOPHAGEAL ECHOCARDIOGRAM (N/A) INSERTION OF DIALYSIS/trialysis CATHETER (N/A)  1  progressing 2 nephrology conts to manage renal issues, plans for catheter and HD- no coumadin till then 3  AHF cont to manage hemodynamics 4 leukocytosis conts to improve, H/H stable    GOLD,WAYNE E 06/12/2016 7:38 AM    Chart reviewed, patient examined, agree with above. He is hemodynamically stable in sinus rhythm. Co-ox 68% off inotropes Eating dysphagia 2 diet. Plan to switch to intermittent HD after filter clots.  Continue out of bed to chair. Hopefully can begin ambulation after off CRRT.

## 2016-06-13 ENCOUNTER — Inpatient Hospital Stay (HOSPITAL_COMMUNITY): Payer: BLUE CROSS/BLUE SHIELD | Admitting: Anesthesiology

## 2016-06-13 ENCOUNTER — Inpatient Hospital Stay (HOSPITAL_COMMUNITY): Payer: BLUE CROSS/BLUE SHIELD

## 2016-06-13 ENCOUNTER — Encounter (HOSPITAL_COMMUNITY)
Admission: EM | Disposition: A | Discharge status: Home Health | Payer: Self-pay | Source: Home / Self Care | Attending: Internal Medicine

## 2016-06-13 DIAGNOSIS — N185 Chronic kidney disease, stage 5: Secondary | ICD-10-CM

## 2016-06-13 HISTORY — PX: INSERTION OF DIALYSIS CATHETER: SHX1324

## 2016-06-13 LAB — GLUCOSE, CAPILLARY
Glucose-Capillary: 171 mg/dL — ABNORMAL HIGH (ref 65–99)
Glucose-Capillary: 180 mg/dL — ABNORMAL HIGH (ref 65–99)
Glucose-Capillary: 165 mg/dL — ABNORMAL HIGH (ref 65–99)
Glucose-Capillary: 151 mg/dL — ABNORMAL HIGH (ref 65–99)
Glucose-Capillary: 160 mg/dL — ABNORMAL HIGH (ref 65–99)

## 2016-06-13 LAB — COMPREHENSIVE METABOLIC PANEL
ALT: 47 U/L (ref 17–63)
AST: 48 U/L — ABNORMAL HIGH (ref 15–41)
Albumin: 1.9 g/dL — ABNORMAL LOW (ref 3.5–5.0)
Alkaline Phosphatase: 124 U/L (ref 38–126)
Anion gap: 6 (ref 5–15)
BUN: 38 mg/dL — ABNORMAL HIGH (ref 6–20)
CO2: 25 mmol/L (ref 22–32)
Calcium: 8 mg/dL — ABNORMAL LOW (ref 8.9–10.3)
Chloride: 105 mmol/L (ref 101–111)
Creatinine, Ser: 3.22 mg/dL — ABNORMAL HIGH (ref 0.61–1.24)
GFR calc Af Amer: 23 mL/min — ABNORMAL LOW (ref >60)
GFR calc non Af Amer: 20 mL/min — ABNORMAL LOW (ref >60)
Glucose, Bld: 154 mg/dL — ABNORMAL HIGH (ref 65–99)
Potassium: 3.8 mmol/L (ref 3.5–5.1)
Sodium: 136 mmol/L (ref 135–145)
Total Bilirubin: 0.5 mg/dL (ref 0.3–1.2)
Total Protein: 4.9 g/dL — ABNORMAL LOW (ref 6.5–8.1)

## 2016-06-13 LAB — CBC
HCT: 29.7 % — ABNORMAL LOW (ref 39.0–52.0)
Hemoglobin: 9.7 g/dL — ABNORMAL LOW (ref 13.0–17.0)
MCH: 29 pg (ref 26.0–34.0)
MCHC: 32.7 g/dL (ref 30.0–36.0)
MCV: 88.9 fL (ref 78.0–100.0)
PLATELETS: 269 10*3/uL (ref 150–400)
RBC: 3.34 MIL/uL — AB (ref 4.22–5.81)
RDW: 14.2 % (ref 11.5–15.5)
WBC: 12.2 10*3/uL — ABNORMAL HIGH (ref 4.0–10.5)

## 2016-06-13 LAB — CARBOXYHEMOGLOBIN
CARBOXYHEMOGLOBIN: 1.9 % — AB (ref 0.5–1.5)
Methemoglobin: 0.8 % (ref 0.0–1.5)
O2 SAT: 61.5 %
TOTAL HEMOGLOBIN: 10.5 g/dL — AB (ref 12.0–16.0)

## 2016-06-13 LAB — PHOSPHORUS: PHOSPHORUS: 3.6 mg/dL (ref 2.5–4.6)

## 2016-06-13 LAB — MAGNESIUM: MAGNESIUM: 2.5 mg/dL — AB (ref 1.7–2.4)

## 2016-06-13 LAB — HEPATITIS C ANTIBODY (REFLEX): HCV AB: 0.1 {s_co_ratio} (ref 0.0–0.9)

## 2016-06-13 LAB — HCV COMMENT:

## 2016-06-13 LAB — HEPATITIS B SURFACE ANTIBODY,QUALITATIVE: Hep B S Ab: REACTIVE

## 2016-06-13 LAB — HEPATITIS B CORE ANTIBODY, IGM: Hep B C IgM: NEGATIVE

## 2016-06-13 LAB — HEPATITIS B SURFACE ANTIGEN: HEP B S AG: NEGATIVE

## 2016-06-13 SURGERY — INSERTION OF DIALYSIS CATHETER
Anesthesia: Monitor Anesthesia Care | Laterality: Right

## 2016-06-13 MED ORDER — SODIUM CHLORIDE 0.9 % IV SOLN: 100.0000 mL | INTRAVENOUS | Status: DC | PRN

## 2016-06-13 MED ORDER — FENTANYL CITRATE (PF) 100 MCG/2ML IJ SOLN: 25.0000 ug | INTRAMUSCULAR | Status: DC | PRN

## 2016-06-13 MED ORDER — HEPARIN SODIUM (PORCINE) 1000 UNIT/ML IJ SOLN
INTRAMUSCULAR | Status: AC
  Filled 2016-06-13: qty 1

## 2016-06-13 MED ORDER — ASPIRIN 81 MG PO CHEW
324.0000 mg | CHEWABLE_TABLET | Freq: Every day | ORAL | Status: DC
Start: 2016-06-13 — End: 2016-06-23

## 2016-06-13 MED ORDER — 0.9 % SODIUM CHLORIDE (POUR BTL) OPTIME
TOPICAL | Status: DC | PRN
  Administered 2016-06-13: 1000 mL

## 2016-06-13 MED ORDER — LIDOCAINE-EPINEPHRINE (PF) 1 %-1:200000 IJ SOLN
INTRAMUSCULAR | Status: AC
  Filled 2016-06-13: qty 30

## 2016-06-13 MED ORDER — LIDOCAINE HCL (PF) 1 % IJ SOLN: 5.0000 mL | INTRAMUSCULAR | Status: DC | PRN

## 2016-06-13 MED ORDER — ASPIRIN EC 325 MG PO TBEC
325.0000 mg | DELAYED_RELEASE_TABLET | Freq: Every day | ORAL | Status: DC
  Administered 2016-06-13 – 2016-06-23 (×10): 325 mg via ORAL
  Filled 2016-06-13 (×9): qty 1

## 2016-06-13 MED ORDER — SODIUM CHLORIDE 0.9 % IV SOLN
INTRAVENOUS | Status: DC
  Administered 2016-06-13: 10:00:00 via INTRAVENOUS

## 2016-06-13 MED ORDER — PROPOFOL 10 MG/ML IV BOLUS
INTRAVENOUS | Status: DC | PRN
  Administered 2016-06-13 (×2): 10 mg via INTRAVENOUS

## 2016-06-13 MED ORDER — PANTOPRAZOLE SODIUM 40 MG PO TBEC
40.0000 mg | DELAYED_RELEASE_TABLET | Freq: Every day | ORAL | Status: DC
  Administered 2016-06-13 – 2016-06-23 (×10): 40 mg via ORAL
  Filled 2016-06-13 (×10): qty 1

## 2016-06-13 MED ORDER — PENTAFLUOROPROP-TETRAFLUOROETH EX AERO: 1.0000 "application " | INHALATION_SPRAY | CUTANEOUS | Status: DC | PRN

## 2016-06-13 MED ORDER — HEPARIN SODIUM (PORCINE) 1000 UNIT/ML DIALYSIS
1000.0000 [IU] | INTRAMUSCULAR | Status: DC | PRN
  Administered 2016-06-14: 1000 [IU] via INTRAVENOUS_CENTRAL
  Filled 2016-06-13 (×2): qty 1

## 2016-06-13 MED ORDER — LIDOCAINE-PRILOCAINE 2.5-2.5 % EX CREA
1.0000 "application " | TOPICAL_CREAM | CUTANEOUS | Status: DC | PRN
  Filled 2016-06-13: qty 5

## 2016-06-13 MED ORDER — AMIODARONE HCL 200 MG PO TABS
400.0000 mg | ORAL_TABLET | Freq: Two times a day (BID) | ORAL | Status: DC
  Administered 2016-06-13 – 2016-06-14 (×4): 400 mg via ORAL
  Filled 2016-06-13 (×3): qty 2

## 2016-06-13 MED ORDER — DARBEPOETIN ALFA 100 MCG/0.5ML IJ SOSY
PREFILLED_SYRINGE | INTRAMUSCULAR | Status: AC
  Filled 2016-06-13: qty 0.5

## 2016-06-13 MED ORDER — CEFAZOLIN SODIUM-DEXTROSE 2-3 GM-% IV SOLR
INTRAVENOUS | Status: DC | PRN
  Administered 2016-06-13: 2 g via INTRAVENOUS

## 2016-06-13 MED ORDER — HEPARIN SODIUM (PORCINE) 1000 UNIT/ML IJ SOLN
INTRAMUSCULAR | Status: DC | PRN
Start: 2016-06-13 — End: 2016-06-13
  Administered 2016-06-13: 3.4 mL

## 2016-06-13 MED ORDER — FENTANYL CITRATE (PF) 100 MCG/2ML IJ SOLN
INTRAMUSCULAR | Status: AC
  Filled 2016-06-13: qty 2

## 2016-06-13 MED ORDER — MIDAZOLAM HCL 2 MG/2ML IJ SOLN
INTRAMUSCULAR | Status: AC
  Filled 2016-06-13: qty 2

## 2016-06-13 MED ORDER — HEPARIN SODIUM (PORCINE) 1000 UNIT/ML DIALYSIS: 40.0000 [IU]/kg | Freq: Once | INTRAMUSCULAR | Status: DC

## 2016-06-13 MED ORDER — ONDANSETRON HCL 4 MG/2ML IJ SOLN
INTRAMUSCULAR | Status: DC | PRN
  Administered 2016-06-13: 4 mg via INTRAVENOUS

## 2016-06-13 MED ORDER — SODIUM CHLORIDE 0.9 % IV SOLN
INTRAVENOUS | Status: DC | PRN
  Administered 2016-06-13: 15:00:00 via INTRAVENOUS

## 2016-06-13 MED ORDER — FENTANYL CITRATE (PF) 100 MCG/2ML IJ SOLN
INTRAMUSCULAR | Status: DC | PRN
  Administered 2016-06-13: 50 ug via INTRAVENOUS

## 2016-06-13 MED ORDER — SODIUM CHLORIDE 0.9 % IV SOLN
INTRAVENOUS | Status: DC | PRN
  Administered 2016-06-13: 500 mL

## 2016-06-13 MED ORDER — PROPOFOL 10 MG/ML IV BOLUS
INTRAVENOUS | Status: AC
  Filled 2016-06-13: qty 40

## 2016-06-13 MED ORDER — RENA-VITE PO TABS
1.0000 | ORAL_TABLET | Freq: Every day | ORAL | Status: DC
  Administered 2016-06-14 – 2016-06-22 (×10): 1 via ORAL
  Filled 2016-06-13 (×10): qty 1

## 2016-06-13 MED ORDER — LIDOCAINE-EPINEPHRINE (PF) 1 %-1:200000 IJ SOLN
INTRAMUSCULAR | Status: DC | PRN
Start: 2016-06-13 — End: 2016-06-13
  Administered 2016-06-13: 21 mL

## 2016-06-13 MED ORDER — MOVING RIGHT ALONG BOOK
Freq: Once | Status: AC
  Administered 2016-06-13: 17:00:00
  Filled 2016-06-13: qty 1

## 2016-06-13 MED ORDER — MIDAZOLAM HCL 5 MG/5ML IJ SOLN
INTRAMUSCULAR | Status: DC | PRN
  Administered 2016-06-13: 1 mg via INTRAVENOUS

## 2016-06-13 MED ORDER — ALTEPLASE 2 MG IJ SOLR
2.0000 mg | Freq: Once | INTRAMUSCULAR | Status: AC | PRN
  Administered 2016-06-15: 1 mg

## 2016-06-13 SURGICAL SUPPLY — 42 items
BAG DECANTER FOR FLEXI CONT (MISCELLANEOUS) ×2 IMPLANT
BIOPATCH RED 1 DISK 7.0 (GAUZE/BANDAGES/DRESSINGS) ×2 IMPLANT
CATH PALINDROME RT-P 15FX19CM (CATHETERS) IMPLANT
CATH PALINDROME RT-P 15FX23CM (CATHETERS) ×2 IMPLANT
CATH PALINDROME RT-P 15FX28CM (CATHETERS) IMPLANT
CATH PALINDROME RT-P 15FX55CM (CATHETERS) IMPLANT
CATH STRAIGHT 5FR 65CM (CATHETERS) IMPLANT
COVER PROBE W GEL 5X96 (DRAPES) ×2 IMPLANT
COVER SURGICAL LIGHT HANDLE (MISCELLANEOUS) ×2 IMPLANT
DRAPE C-ARM 42X72 X-RAY (DRAPES) IMPLANT
DRAPE CHEST BREAST 15X10 FENES (DRAPES) ×2 IMPLANT
GAUZE SPONGE 2X2 8PLY STRL LF (GAUZE/BANDAGES/DRESSINGS) ×1 IMPLANT
GAUZE SPONGE 4X4 16PLY XRAY LF (GAUZE/BANDAGES/DRESSINGS) ×2 IMPLANT
GLOVE BIO SURGEON STRL SZ7 (GLOVE) ×2 IMPLANT
GLOVE BIOGEL PI IND STRL 7.5 (GLOVE) ×1 IMPLANT
GLOVE BIOGEL PI INDICATOR 7.5 (GLOVE) ×1
GLOVE INDICATOR 6.5 STRL GRN (GLOVE) ×2 IMPLANT
GLOVE INDICATOR 7.0 STRL GRN (GLOVE) ×2 IMPLANT
GLOVE SURG SS PI 7.5 STRL IVOR (GLOVE) ×2 IMPLANT
GOWN STRL REUS W/ TWL LRG LVL3 (GOWN DISPOSABLE) ×2 IMPLANT
GOWN STRL REUS W/TWL LRG LVL3 (GOWN DISPOSABLE) ×2
KIT BASIN OR (CUSTOM PROCEDURE TRAY) ×2 IMPLANT
KIT ROOM TURNOVER OR (KITS) ×2 IMPLANT
LIQUID BAND (GAUZE/BANDAGES/DRESSINGS) ×2 IMPLANT
NEEDLE 18GX1X1/2 (RX/OR ONLY) (NEEDLE) ×2 IMPLANT
NEEDLE HYPO 25GX1X1/2 BEV (NEEDLE) ×2 IMPLANT
NS IRRIG 1000ML POUR BTL (IV SOLUTION) ×2 IMPLANT
PACK SURGICAL SETUP 50X90 (CUSTOM PROCEDURE TRAY) ×2 IMPLANT
PAD ARMBOARD 7.5X6 YLW CONV (MISCELLANEOUS) ×4 IMPLANT
SET MICROPUNCTURE 5F STIFF (MISCELLANEOUS) IMPLANT
SOAP 2 % CHG 4 OZ (WOUND CARE) ×2 IMPLANT
SPONGE GAUZE 2X2 STER 10/PKG (GAUZE/BANDAGES/DRESSINGS) ×1
SUT ETHILON 3 0 PS 1 (SUTURE) ×2 IMPLANT
SUT MNCRL AB 4-0 PS2 18 (SUTURE) ×2 IMPLANT
SYR 20CC LL (SYRINGE) ×4 IMPLANT
SYR 3ML LL SCALE MARK (SYRINGE) ×2 IMPLANT
SYR 5ML LL (SYRINGE) IMPLANT
SYR CONTROL 10ML LL (SYRINGE) ×2 IMPLANT
SYRINGE 10CC LL (SYRINGE) ×2 IMPLANT
TAPE CLOTH SOFT 2X10 (GAUZE/BANDAGES/DRESSINGS) ×2 IMPLANT
WATER STERILE IRR 1000ML POUR (IV SOLUTION) ×2 IMPLANT
WIRE AMPLATZ SS-J .035X180CM (WIRE) IMPLANT

## 2016-06-13 NOTE — Anesthesia Postprocedure Evaluation (Signed)
Anesthesia Post Note  Patient: Kenneth [name]  Procedure(s) Performed: Procedure(s) (LRB): INSERTION OF DIALYSIS CATHETER RIGHT INTERNAL JUGULAR (Right)  Patient location during evaluation: PACU Anesthesia Type: MAC Level of consciousness: awake and alert Pain management: pain level controlled Vital Signs Assessment: post-procedure vital signs reviewed and stable Respiratory status: spontaneous breathing, nonlabored ventilation, respiratory function stable and patient connected to nasal cannula oxygen Cardiovascular status: stable and blood pressure returned to baseline Anesthetic complications: no    Last Vitals:  Vitals:   06/13/16 1540 06/13/16 1555  BP: (!) 141/79 110/72  Pulse: (!) 108 (!) 106  Resp: 16 (!) 21  Temp: 36.3 C     Last Pain:  Vitals:   06/13/16 0902  TempSrc: Oral  PainSc:                  Blakelyn Dinges,W. EDMOND

## 2016-06-13 NOTE — Transfer of Care (Signed)
Immediate Anesthesia Transfer of Care Note  Patient: Kenneth [name]  Procedure(s) Performed: Procedure(s): INSERTION OF DIALYSIS CATHETER RIGHT INTERNAL JUGULAR (Right)  Patient Location: PACU  Anesthesia Type:MAC  Level of Consciousness: awake, alert  and oriented  Airway & Oxygen Therapy: Patient Spontanous Breathing and Patient connected to face mask oxygen  Post-op Assessment: Report given to RN and Post -op Vital signs reviewed and stable  Post vital signs: Reviewed and stable  Last Vitals:  Vitals:   06/13/16 0900 06/13/16 0902  BP: 118/62   Pulse: (!) 58   Resp: (!) 21   Temp:  37.1 C    Last Pain:  Vitals:   06/13/16 0902  TempSrc: Oral  PainSc:       Patients Stated Pain Goal: 3 (18/36/72 5500)  Complications: No apparent anesthesia complications

## 2016-06-13 NOTE — H&P (View-Only) (Signed)
Referred by:  Dr. Jimmy Footman (Nephrology)  Reason for referral: tunneled dialysis catheter placement   History of Present Illness  Kenneth [name] is a 60 y.o. (03-24-1956) male previous CKD Stage IV who presents for [facility] placement.  Pt is s/p CABG x 3v on 06/05/16.  Pt was already on CVVHD preop.  He had a R SCV trivascular catheter placed intraop.    Past Medical History:  CAD  Pulmonary edema  MT defomity  Keratoma  Diabetic microangiopathy  Ischemic rest pain  B DVT  DM  Anemia  CKD Stage IV  Past Surgical History:  Procedure Laterality Date  . CARDIAC CATHETERIZATION N/A 05/31/2016   Procedure: Left Heart Cath and Coronary Angiography;  Surgeon: Jettie Booze, MD;  Location: [place] [facility];  Service: Cardiovascular;  Laterality: N/A;  . CARDIAC CATHETERIZATION N/A 05/31/2016   Procedure: Right Heart Cath;  Surgeon: Jettie Booze, MD;  Location: [place] [facility];  Service: Cardiovascular;  Laterality: N/A;  . CARDIAC CATHETERIZATION N/A 05/31/2016   Procedure: IABP Insertion;  Surgeon: Jettie Booze, MD;  Location: [place] [facility];  Service: Cardiovascular;  Laterality: N/A;  . CORONARY ARTERY BYPASS GRAFT N/A 06/05/2016   Procedure: CORONARY ARTERY BYPASS GRAFTING (CABG) x3 LIMA to LAD -SVG to OM -SVG to RCA;  Surgeon: Ivin Poot, MD;  Location: [place];  Service: Open Heart Surgery;  Laterality: N/A;  . INSERTION OF DIALYSIS CATHETER N/A 06/05/2016   Procedure: INSERTION OF DIALYSIS/trialysis CATHETER;  Surgeon: Ivin Poot, MD;  Location: [place];  Service: Vascular;  Laterality: N/A;  . INTRAOPERATIVE TRANSESOPHAGEAL ECHOCARDIOGRAM N/A 06/05/2016   Procedure: INTRAOPERATIVE TRANSESOPHAGEAL ECHOCARDIOGRAM;  Surgeon: Ivin Poot, MD;  Location: [place];  Service: Open Heart Surgery;  Laterality: N/A;    Social History   Social History  . Marital status: Unknown    Spouse name: N/A  . Number of children: N/A  . Years of education:  N/A   Occupational History  . Not on file.   Social History Main Topics  . Smoking status: Former Research scientist (life sciences)  . Smokeless tobacco: Never Used  . Alcohol use Not on file  . Drug use: Unknown  . Sexual activity: Not on file   Other Topics Concern  . Not on file   Social History Narrative  . No narrative on file    [facility]: Language barrier limits the ability to get family history  Current Facility-Administered Medications  Medication Dose Route Frequency Provider Last Rate Last Dose  . 0.9 %  sodium chloride infusion  250 mL Intravenous Continuous Erin R Barrett, PA-C 1 mL/hr at 06/12/16 0741 250 mL at 06/12/16 0741  . amiodarone (PACERONE) tablet 400 mg  400 mg Oral BID Larey Dresser, MD   400 mg at 06/12/16 0914  . aspirin EC tablet 325 mg  325 mg Oral Daily Erin R Barrett, PA-C   325 mg at 06/12/16 7412   Or  . aspirin chewable tablet 324 mg  324 mg Per Tube Daily Erin R Barrett, PA-C   324 mg at 06/09/16 0909  . atorvastatin (LIPITOR) tablet 80 mg  80 mg Oral q1800 Larey Dresser, MD   80 mg at 06/11/16 1803  . bisacodyl (DULCOLAX) EC tablet 10 mg  10 mg Oral Daily Erin R Barrett, PA-C   10 mg at 06/10/16 8786   Or  . bisacodyl (DULCOLAX) suppository 10 mg  10 mg Rectal Daily Erin R Barrett, PA-C  10 mg at 06/08/16 0938  . carvedilol (COREG) tablet 3.125 mg  3.125 mg Oral BID WC Larey Dresser, MD   3.125 mg at 06/12/16 0914  . chlorhexidine (PERIDEX) 0.12 % solution 15 mL  15 mL Mouth Rinse BID Ivin Poot, MD   15 mL at 06/12/16 0915  . [START ON 06/13/2016] Darbepoetin Alfa (ARANESP) injection 100 mcg  100 mcg Intravenous Q Sat-HD Mauricia Area, MD      . docusate sodium (COLACE) capsule 200 mg  200 mg Oral Daily Erin R Barrett, PA-C   200 mg at 06/10/16 0908  . ferumoxytol (FERAHEME) 510 mg in sodium chloride 0.9 % 100 mL IVPB  510 mg Intravenous Weekly Mauricia Area, MD   510 mg at 06/08/16 0958  . heparin injection 1,000-6,000 Units  1,000-6,000 Units CRRT PRN Jamal Maes, MD   2,600 Units at 06/12/16 0906  . heparin injection 5,000 Units  5,000 Units Subcutaneous Q12H Ivin Poot, MD   5,000 Units at 06/12/16 0915  . Influenza vac split quadrivalent PF (FLUARIX) injection 0.5 mL  0.5 mL Intramuscular Prior to discharge Raylene Miyamoto, MD      . insulin aspart (novoLOG) injection 0-15 Units  0-15 Units Subcutaneous Q4H Gaye Pollack, MD   5 Units at 06/12/16 1145  . insulin detemir (LEVEMIR) injection 6 Units  6 Units Subcutaneous BID Ivin Poot, MD   6 Units at 06/12/16 0915  . isosorbide mononitrate (IMDUR) 24 hr tablet 15 mg  15 mg Oral Daily Satira Mccallum Tillery, PA-C   15 mg at 06/12/16 0914  . labetalol (NORMODYNE,TRANDATE) injection 10 mg  10 mg Intravenous Q2H PRN Ivin Poot, MD   10 mg at 06/12/16 0739  . MEDLINE mouth rinse  15 mL Mouth Rinse q12n4p Ivin Poot, MD   15 mL at 06/11/16 1200  . morphine 2 MG/ML injection 2-5 mg  2-5 mg Intravenous Q1H PRN Erin R Barrett, PA-C   2 mg at 06/12/16 0947  . ondansetron (ZOFRAN) injection 4 mg  4 mg Intravenous Q6H PRN Erin R Barrett, PA-C      . oxyCODONE (Oxy IR/ROXICODONE) immediate release tablet 5-10 mg  5-10 mg Oral Q3H PRN Erin R Barrett, PA-C      . pantoprazole (PROTONIX) EC tablet 40 mg  40 mg Oral Q1200 Ivin Poot, MD   40 mg at 06/12/16 1145  . prismasol BGK 4/2.5 5,000 mL dialysis replacement fluid   CRRT Continuous Jamal Maes, MD 400 mL/hr at 06/12/16 0831    . prismasol BGK 4/2.5 5,000 mL dialysis replacement fluid   CRRT Continuous Jamal Maes, MD 200 mL/hr at 06/10/16 2044    . prismasol BGK 4/2.5 5,000 mL dialysis solution   CRRT Continuous Jamal Maes, MD 1,000 mL/hr at 06/12/16 0415    . sodium chloride 0.9 % primer fluid for CRRT   CRRT PRN Jamal Maes, MD      . sodium chloride flush (NS) 0.9 % injection 10-40 mL  10-40 mL Intracatheter Q12H Ivin Poot, MD   10 mL at 06/12/16 0915  . sodium chloride flush (NS) 0.9 % injection 10-40 mL   10-40 mL Intracatheter PRN Ivin Poot, MD      . sodium chloride flush (NS) 0.9 % injection 3 mL  3 mL Intravenous Q12H Erin R Barrett, PA-C   3 mL at 06/12/16 0915  . sodium chloride flush (NS) 0.9 % injection 3 mL  3  mL Intravenous PRN Erin R Barrett, PA-C      . traMADol (ULTRAM) tablet 50-100 mg  50-100 mg Oral Q4H PRN Erin R Barrett, PA-C        Allergies  Allergen Reactions  . Hydralazine Rash    REVIEW OF SYSTEMS:   Cardiac:  positive for: Shortness of breath upon exertion, negative for: Chest pain or chest pressure and Shortness of breath when lying flat,   Vascular:  positive for: Pain in calf, thigh, or hip brought on by ambulation,  negative for: Pain in feet at night that wakes you up from your sleep, Blood clot in your veins and Leg swelling  Pulmonary:  positive for: no symptoms,  negative for: Oxygen at home, Productive cough and Wheezing  Neurologic:  positive for: Problems with dizziness, negative for: Sudden weakness in arms or legs, Sudden numbness in arms or legs, Sudden onset of difficulty speaking or slurred speech, Temporary loss of vision in one eye and Problems with dizziness  Gastrointestinal:  positive for: no symptoms, negative for: Blood in stool and Vomited blood  Genitourinary:  positive for: no symptoms, negative for: Burning when urinating and Blood in urine  Psychiatric:  positive for: no symptoms,  negative for: Major depression  Hematologic:  positive for: no symptoms,  negative for: negative for: Bleeding problems and Problems with blood clotting too easily  Dermatologic:  positive for: no symptoms, negative for: Rashes or ulcers  Constitutional:  positive for: no symptoms, negative for: Fever or chills   Physical Examination  Vitals:   06/12/16 1124 06/12/16 1200 06/12/16 1300 06/12/16 1400  BP:  (!) 104/51 (!) 107/58 (!) 103/56  Pulse:  70 70 60  Resp:  (!) 22 (!) 22 20  Temp: 98.4 F (36.9 C)     TempSrc: Oral      SpO2:  95% 96% 98%  Weight:      Height:        Body mass index is 21.56 kg/m.  General: Alert, O x 3, WD,painful grimace with movement  Head: [place]/AT,   Ear/Nose/Throat: Hearing grossly intact, nares without erythema or drainage, oropharynx without Erythema or Exudate , Mallampati score: 3, Dentition intact  Eyes: PERRLA, EOMI,   Neck: Supple, mid-line trachea,  LIJ CVC  Pulmonary: Sym exp, good B air movt,rales on BLL, R SCV trivascular catheter  Cardiac: RRR, Nl S1, S2, no Murmurs, No rubs, No S3,S4, sternal inc c/d/i  Vascular: Vessel Right Left  Radial Palpable Palpable  Brachial Palpable Palpable   Laboratory: CBC:    Component Value Date/Time   WBC 12.4 (H) 06/12/2016 0428   RBC 3.66 (L) 06/12/2016 0428   HGB 10.7 (L) 06/12/2016 0428   HCT 32.8 (L) 06/12/2016 0428   PLT 266 06/12/2016 0428   MCV 89.6 06/12/2016 0428   MCH 29.2 06/12/2016 0428   MCHC 32.6 06/12/2016 0428   RDW 14.4 06/12/2016 0428    BMP:    Component Value Date/Time   NA 138 06/12/2016 0428   K 3.6 06/12/2016 0428   CL 103 06/12/2016 0428   CO2 25 06/12/2016 0428   GLUCOSE 119 (H) 06/12/2016 0428   BUN 20 06/12/2016 0428   CREATININE 2.13 (H) 06/12/2016 0428   CALCIUM 8.1 (L) 06/12/2016 0428   GFRNONAA 32 (L) 06/12/2016 0428   GFRAA 38 (L) 06/12/2016 0428    Coagulation: Lab Results  Component Value Date   INR 1.10 06/12/2016   INR 1.47 06/06/2016   INR 0.96 05/31/2016  No results found for: PTT  Lipids:    Component Value Date/Time   TRIG 132 06/03/2016 1609    Radiology: Dg Chest Port 1 View  Result Date: 06/11/2016 CLINICAL DATA:  Chest soreness.  CABG EXAM: PORTABLE CHEST 1 VIEW COMPARISON:  Yesterday FINDINGS: Stable positioning of central lines, including left IJ catheter with tip at the distal left brachiocephalic vein. Left chest tube has been removed without pneumothorax. Unchanged basilar atelectasis, greater on the right with probable small effusion.  Stable postoperative heart size. CABG changes. IMPRESSION: 1. Interval removal of left chest tube without pneumothorax. 2. Stable bibasilar atelectasis. Electronically Signed   By: Monte Fantasia M.D.   On: 06/11/2016 07:32     Medical Decision Making  Kenneth [name] is a 60 y.o. male who presents with Acute on chronic renal disease stage IV, s/p CABG   Nephrology request placement of tunneled dialysis catheter.  Will plan on RIJV TDC placement tomorrow if possible, I.e. non-thrombosis of R internal jugular vein  The patient is aware the risks of tunneled dialysis catheter placement include but are not limited to: bleeding, infection, central venous injury, pneumothorax, possible venous stenosis, possible malpositioning in the venous system, and possible infections related to long-term catheter presence.  The patient was aware of these risks and agreed to proceed.   Adele Barthel, MD [facility] Office: [phone] Pager: [phone]  06/12/2016, 3:06 PM

## 2016-06-13 NOTE — Progress Notes (Signed)
Patient remains alert and oriented x 4. Patient has been made aware of delays and comfort measures offered.

## 2016-06-13 NOTE — Plan of Care (Signed)
Problem: Activity: Goal: Risk for activity intolerance will decrease Outcome: Progressing Pt walking.  Problem: Bowel/Gastric: Goal: Gastrointestinal status for postoperative course will improve Outcome: Progressing Last BM 06/11/2016  Problem: Nutritional: Goal: Risk for body nutrition deficit will decrease Outcome: Progressing Pt appetite remains poor.

## 2016-06-13 NOTE — Interval H&P Note (Signed)
[facility]  History and Physical Update  The patient was interviewed and re-examined.  [name] previous History and Physical has been reviewed and is unchanged from my consult.  There is no change in the plan of care: RIJV TDC placement.  Adele Barthel, MD [facility] [place] Office: [phone] Pager: [phone]  06/13/2016, 7:17 AM

## 2016-06-13 NOTE — Progress Notes (Signed)
Day of Surgery Procedure(s) (LRB): INSERTION OF DIALYSIS CATHETER RIGHT INTERNAL JUGULAR (Right) Subjective: Had dialysis catheter placed earlier Now on HD  Objective: Vital signs in last 24 hours: Temp:  [97.3 F (36.3 C)-98.8 F (37.1 C)] 97.3 F (36.3 C) (09/30 1540) Pulse Rate:  [55-108] 97 (09/30 1800) Cardiac Rhythm: Atrial fibrillation (09/30 1800) Resp:  [11-23] 18 (09/30 1800) BP: (93-141)/(46-79) 93/61 (09/30 1800) SpO2:  [91 %-99 %] 95 % (09/30 1800) Weight:  [138 lb 0.1 oz (62.6 kg)] 138 lb 0.1 oz (62.6 kg) (09/30 0500)  Hemodynamic parameters for last 24 hours:    Intake/Output from previous day: 09/29 0701 - 09/30 0700 In: 270.2 [P.O.:200; I.V.:70.2] Out: 204 [Urine:100] Intake/Output this shift: Total I/O In: 400 [P.O.:200; I.V.:200] Out: 5 [Blood:5]  General appearance: cooperative and no distress Neurologic: intact Heart: regular rate and rhythm Lungs: clear to auscultation bilaterally  Lab Results:  Recent Labs  06/12/16 0428 06/13/16 0416  WBC 12.4* 12.2*  HGB 10.7* 9.7*  HCT 32.8* 29.7*  PLT 266 269   BMET:  Recent Labs  06/12/16 1600 06/13/16 0416  NA 135 136  K 5.8* 3.8  CL 104 105  CO2 23 25  GLUCOSE 131* 154*  BUN 29* 38*  CREATININE 2.61* 3.22*  CALCIUM 7.7* 8.0*    PT/INR:  Recent Labs  06/12/16 0428  LABPROT 14.3  INR 1.10   ABG    Component Value Date/Time   PHART 7.439 06/09/2016 1247   HCO3 26.3 06/09/2016 1247   TCO2 27 06/09/2016 1247   ACIDBASEDEF 3.0 (H) 06/09/2016 1118   O2SAT 61.5 06/13/2016 0528   CBG (last 3)   Recent Labs  06/13/16 1120 06/13/16 1408 06/13/16 1650  GLUCAP 165* 151* 160*    Assessment/Plan: S/P Procedure(s) (LRB): INSERTION OF DIALYSIS CATHETER RIGHT INTERNAL JUGULAR (Right) -\  Had some atrial fib in OR. Was rate controlled- continue PO meds HD per Nephrology   LOS: 13 days    Melrose Nakayama 06/13/2016

## 2016-06-13 NOTE — Progress Notes (Signed)
Subjective: Interval History: has no complaint , doing better, feeling stronger.  Objective: Vital signs in last 24 hours: Temp:  [97.5 F (36.4 C)-98.8 F (37.1 C)] 98.4 F (36.9 C) (09/30 0325) Pulse Rate:  [55-72] 61 (09/30 0600) Resp:  [12-22] 13 (09/30 0600) BP: (88-165)/(46-77) 135/57 (09/30 0600) SpO2:  [91 %-100 %] 98 % (09/30 0600) Weight:  [62.6 kg (138 lb 0.1 oz)] 62.6 kg (138 lb 0.1 oz) (09/30 0500) Weight change: 2 kg (4 lb 6.6 oz)  Intake/Output from previous day: 09/29 0701 - 09/30 0700 In: 270.2 [P.O.:200; I.V.:70.2] Out: 204 [Urine:100] Intake/Output this shift: Total I/O In: 200 [P.O.:200] Out: 100 [Urine:100]  General appearance: alert, cooperative, no distress and pale Resp: diminished breath sounds bilaterally and rales bibasilar Chest wall: R [place] cath Cardio: systolic murmur: holosystolic 2/6, blowing at apex GI: soft, pos bs, liver down 5 cm Extremities: edema 1+  Lab Results:  Recent Labs  06/12/16 0428 06/13/16 0416  WBC 12.4* 12.2*  HGB 10.7* 9.7*  HCT 32.8* 29.7*  PLT 266 269   BMET:  Recent Labs  06/12/16 1600 06/13/16 0416  NA 135 136  K 5.8* 3.8  CL 104 105  CO2 23 25  GLUCOSE 131* 154*  BUN 29* 38*  CREATININE 2.61* 3.22*  CALCIUM 7.7* 8.0*   No results for input(s): PTH in the last 72 hours. Iron Studies: No results for input(s): IRON, TIBC, TRANSFERRIN, FERRITIN in the last 72 hours.  Studies/Results: No results found.  I have reviewed the patient's current medications.  Assessment/Plan: 1 CKD4/AKI vol xs , oliguric. Will plan HD today to stay ahead of him. 2 Anemia down today 3 HPTH ok 4 CAD/CABG 5 DM controlled P  HD, esa, Fe, mobilize, convert to PC    LOS: 13 days   Elleana Stillson L 06/13/2016,6:56 AM

## 2016-06-13 NOTE — Op Note (Addendum)
OPERATIVE NOTE  PROCEDURE: 1. Right internal jugular vein tunneled dialysis catheter placement 2. Right internal jugular vein cannulation under ultrasound guidance 3. Removal of right subclavian vein temporary dialysis catheter  PRE-OPERATIVE DIAGNOSIS: end-stage renal failure  POST-OPERATIVE DIAGNOSIS: same as above  SURGEON: Adele Barthel, MD  ANESTHESIA: local and MAC  ESTIMATED BLOOD LOSS: 30 cc  FINDING(S): 1.  Tips of the catheter in the right atrium on fluoroscopy 2.  No obvious pneumothorax on fluoroscopy  SPECIMEN(S):  none  INDICATIONS:   Kenneth [name] is a 60 y.o. male who presents with end stage renal disease.  The patient presents for tunneled dialysis catheter placement.  The patient is aware the risks of tunneled dialysis catheter placement include but are not limited to: bleeding, infection, central venous injury, pneumothorax, possible venous stenosis, possible malpositioning in the venous system, and possible infections related to long-term catheter presence.  The patient was aware of these risks and agreed to proceed.  DESCRIPTION: After written full informed consent was obtained from the patient, the patient was taken back to the operating room.  Prior to induction, the patient was given IV antibiotics.  After obtaining adequate sedation, the patient was prepped and draped in the standard fashion for a chest or neck tunneled dialysis catheter placement.     Prior to starting the placement of the new tunneled dialysis catheter, I had to remove the old temporary dialysis catheter as it was blocking the chest exit site.  I sharply released the prior securing sutures on the temporary dialysis catheter.  I then pulled out the catheter while compressing the skin tract for the catheter.  There was already some clotting in this tract.  After hold pressure for 3 minutes, there was no further bleeding.  The cannulation site, the catheter exit site, and tract for the  subcutaneous tunnel were then anesthestized with a total of 20 cc of 1% lidocaine without epinephrine.  Under ultrasound guidance, the right internal jugular vein was cannulated with the 18 gauge needle.  A J-wire was then placed down into the right ventricle under fluoroscopic guidance.  The wire was then secured in place with a clamp to the drapes.  I then made stab incisions at the neck and exit sites.   I dissected from the exit site to the cannulation site with a tunneler.   The subcutaneous tunnel was dilated by passing a plastic dilator over the metal dissector. The wire was then unclamped and I removed the needle.  The skin tract and venotomy was dilated serially with dilators.  Finally, the dilator-sheath was placed under fluoroscopic guidance into the superior vena cava.  The dilator and wire were removed.  A 23 cm Palindrome catheter was placed under fluoroscopic guidance down into the right atrium.  The sheath was broken and peeled away while holding the catheter cuff at the level of the skin.  The back end of this catheter was transected, and docked onto the tunneler.  The distal catheter was delivered through the subcutaneous tunnel.  The catheter was transected a second time, revealing the two lumens of this catheter.  The ports were docked onto these two lumens.  The catheter collar was then snapped into place.  Each port was tested by aspirating and flushing.  No resistance was noted.  Each port was then thoroughly flushed with heparinized saline.  The catheter was secured in placed with two interrupted stitches of 3-0 Nylon tied to the catheter.  The neck incision was  closed with a U-stitch of 4-0 Monocryl.  The neck and chest incision were cleaned and sterile bandages applied.  Each port was then loaded with concentrated heparin (1000 Units/mL) at the manufacturer recommended volumes to each port.  Sterile caps were applied to each port.    On completion fluoroscopy, the tips of the catheter  were in the right atrium, and there was no evidence of pneumothorax.   COMPLICATIONS: none  CONDITION: stable   Adele Barthel, MD [facility] Office: [phone] Pager: [phone]  06/13/2016, 3:39 PM

## 2016-06-13 NOTE — Procedures (Signed)
I was present at this session.  I have reviewed the session itself and made appropriate changes. HD via new PC.  Bp low after pain meds.  Hollynn Garno L 9/30/20176:52 PM

## 2016-06-13 NOTE — Anesthesia Preprocedure Evaluation (Signed)
Anesthesia Evaluation  Patient identified by MRN, date of birth, ID band Patient awake    Reviewed: Allergy & Precautions, H&P , NPO status , Patient's Chart, lab work & pertinent test results, reviewed documented beta blocker date and time   Airway Mallampati: III  TM Distance: >3 FB Neck ROM: Full    Dental no notable dental hx. (+) Teeth Intact, Dental Advisory Given   Pulmonary neg pulmonary ROS, former smoker,    Pulmonary exam normal breath sounds clear to auscultation       Cardiovascular hypertension, + CAD, + Past MI, + CABG, + Peripheral Vascular Disease and +CHF   Rhythm:Regular Rate:Normal     Neuro/Psych negative neurological ROS  negative psych ROS   GI/Hepatic negative GI ROS, Neg liver ROS,   Endo/Other  negative endocrine ROS  Renal/GU CRF and DialysisRenal disease  negative genitourinary   Musculoskeletal   Abdominal   Peds  Hematology negative hematology ROS (+)   Anesthesia Other Findings   Reproductive/Obstetrics negative OB ROS                             Anesthesia Physical Anesthesia Plan  ASA: IV  Anesthesia Plan: MAC   Post-op Pain Management:    Induction: Intravenous  Airway Management Planned: Simple Face Mask  Additional Equipment:   Intra-op Plan:   Post-operative Plan:   Informed Consent: I have reviewed the patients History and Physical, chart, labs and discussed the procedure including the risks, benefits and alternatives for the proposed anesthesia with the patient or authorized representative who has indicated his/her understanding and acceptance.   Dental advisory given  Plan Discussed with: CRNA  Anesthesia Plan Comments:         Anesthesia Quick Evaluation

## 2016-06-14 ENCOUNTER — Inpatient Hospital Stay (HOSPITAL_COMMUNITY): Payer: BLUE CROSS/BLUE SHIELD

## 2016-06-14 ENCOUNTER — Encounter (HOSPITAL_COMMUNITY): Payer: Self-pay | Admitting: Vascular Surgery

## 2016-06-14 DIAGNOSIS — Z01810 Encounter for preprocedural cardiovascular examination: Secondary | ICD-10-CM

## 2016-06-14 LAB — GLUCOSE, CAPILLARY
Glucose-Capillary: 104 mg/dL — ABNORMAL HIGH (ref 65–99)
Glucose-Capillary: 116 mg/dL — ABNORMAL HIGH (ref 65–99)
Glucose-Capillary: 125 mg/dL — ABNORMAL HIGH (ref 65–99)
Glucose-Capillary: 132 mg/dL — ABNORMAL HIGH (ref 65–99)
Glucose-Capillary: 140 mg/dL — ABNORMAL HIGH (ref 65–99)
Glucose-Capillary: 166 mg/dL — ABNORMAL HIGH (ref 65–99)

## 2016-06-14 LAB — CARBOXYHEMOGLOBIN
CARBOXYHEMOGLOBIN: 1.4 % (ref 0.5–1.5)
Methemoglobin: 1.2 % (ref 0.0–1.5)
O2 Saturation: 53.2 %
Total hemoglobin: 10.9 g/dL — ABNORMAL LOW (ref 12.0–16.0)

## 2016-06-14 LAB — RENAL FUNCTION PANEL
ANION GAP: 9 (ref 5–15)
Albumin: 2 g/dL — ABNORMAL LOW (ref 3.5–5.0)
BUN: 17 mg/dL (ref 6–20)
CALCIUM: 7.7 mg/dL — AB (ref 8.9–10.3)
CO2: 25 mmol/L (ref 22–32)
Chloride: 104 mmol/L (ref 101–111)
Creatinine, Ser: 2.17 mg/dL — ABNORMAL HIGH (ref 0.61–1.24)
GFR calc non Af Amer: 32 mL/min — ABNORMAL LOW (ref >60)
GFR, EST AFRICAN AMERICAN: 37 mL/min — AB (ref >60)
Glucose, Bld: 132 mg/dL — ABNORMAL HIGH (ref 65–99)
PHOSPHORUS: 2.6 mg/dL (ref 2.5–4.6)
Potassium: 4 mmol/L (ref 3.5–5.1)
SODIUM: 138 mmol/L (ref 135–145)

## 2016-06-14 LAB — CBC
HCT: 31.4 % — ABNORMAL LOW (ref 39.0–52.0)
HEMOGLOBIN: 10.1 g/dL — AB (ref 13.0–17.0)
MCH: 29 pg (ref 26.0–34.0)
MCHC: 32.2 g/dL (ref 30.0–36.0)
MCV: 90.2 fL (ref 78.0–100.0)
Platelets: 245 10*3/uL (ref 150–400)
RBC: 3.48 MIL/uL — ABNORMAL LOW (ref 4.22–5.81)
RDW: 14.3 % (ref 11.5–15.5)
WBC: 12.9 10*3/uL — ABNORMAL HIGH (ref 4.0–10.5)

## 2016-06-14 MED ORDER — ALTEPLASE 2 MG IJ SOLR
4.0000 mg | Freq: Once | INTRAMUSCULAR | Status: AC | PRN
  Administered 2016-06-15: 1 mg
  Filled 2016-06-14: qty 4

## 2016-06-14 NOTE — Progress Notes (Signed)
   Daily Progress Note   Assessment/Planning: POD #1 s/p RIJV TDC   Renal requesting permanent access placement  Vein mapping ordered  Will arrange access placement once results available  HD run successful yesterday   Subjective  - 1 Day Post-Op  No complaints  Objective Vitals:   06/14/16 0400 06/14/16 0500 06/14/16 0600 06/14/16 0700  BP: 107/70 (!) 96/33 (!) 116/50 (!) 117/43  Pulse: (!) 103 (!) 56 (!) 56 (!) 56  Resp: (!) 24 16 (!) 9 14  Temp:      TempSrc:      SpO2: 92% 93% 93% 91%  Weight:      Height:         Intake/Output Summary (Last 24 hours) at 06/14/16 0824 Last data filed at 06/14/16 0000  Gross per 24 hour  Intake              760 ml  Output             1305 ml  Net             -545 ml    VASC No bleeding from neck incision or chest exit site  Laboratory CBC    Component Value Date/Time   WBC 12.9 (H) 06/14/2016 0430   HGB 10.1 (L) 06/14/2016 0430   HCT 31.4 (L) 06/14/2016 0430   PLT 245 06/14/2016 0430    BMET    Component Value Date/Time   NA 138 06/14/2016 0430   K 4.0 06/14/2016 0430   CL 104 06/14/2016 0430   CO2 25 06/14/2016 0430   GLUCOSE 132 (H) 06/14/2016 0430   BUN 17 06/14/2016 0430   CREATININE 2.17 (H) 06/14/2016 0430   CALCIUM 7.7 (L) 06/14/2016 0430   GFRNONAA 32 (L) 06/14/2016 0430   GFRAA 37 (L) 06/14/2016 0430     Adele Barthel, MD, FACS [facility]: [phone] Pager: [phone]  06/14/2016, 8:24 AM

## 2016-06-14 NOTE — Progress Notes (Signed)
1 Day Post-Op Procedure(s) (LRB): INSERTION OF DIALYSIS CATHETER RIGHT INTERNAL JUGULAR (Right) Subjective: No complaints this AM  Objective: Vital signs in last 24 hours: Temp:  [97.3 F (36.3 C)-98.7 F (37.1 C)] 98.6 F (37 C) (10/01 0857) Pulse Rate:  [56-113] 56 (10/01 0700) Cardiac Rhythm: Sinus bradycardia (10/01 0100) Resp:  [9-28] 14 (10/01 0700) BP: (86-141)/(33-79) 117/43 (10/01 0700) SpO2:  [90 %-99 %] 91 % (10/01 0700) Weight:  [136 lb 3.9 oz (61.8 kg)-138 lb 14.2 oz (63 kg)] 136 lb 3.9 oz (61.8 kg) (09/30 2215)  Hemodynamic parameters for last 24 hours:    Intake/Output from previous day: 09/30 0701 - 10/01 0700 In: 760 [P.O.:560; I.V.:200] Out: 1305 [Blood:5] Intake/Output this shift: No intake/output data recorded.  General appearance: alert, cooperative and no distress Neurologic: intact Heart: brady, regular Lungs: clear to auscultation bilaterally Abdomen: normal findings: soft, non-tender  Lab Results:  Recent Labs  06/13/16 0416 06/14/16 0430  WBC 12.2* 12.9*  HGB 9.7* 10.1*  HCT 29.7* 31.4*  PLT 269 245   BMET:  Recent Labs  06/13/16 0416 06/14/16 0430  NA 136 138  K 3.8 4.0  CL 105 104  CO2 25 25  GLUCOSE 154* 132*  BUN 38* 17  CREATININE 3.22* 2.17*  CALCIUM 8.0* 7.7*    PT/INR:  Recent Labs  06/12/16 0428  LABPROT 14.3  INR 1.10   ABG    Component Value Date/Time   PHART 7.439 06/09/2016 1247   HCO3 26.3 06/09/2016 1247   TCO2 27 06/09/2016 1247   ACIDBASEDEF 3.0 (H) 06/09/2016 1118   O2SAT 53.2 06/14/2016 0405   CBG (last 3)   Recent Labs  06/14/16 0043 06/14/16 0413 06/14/16 0854  GLUCAP 140* 116* 104*    Assessment/Plan: S/P Procedure(s) (LRB): INSERTION OF DIALYSIS CATHETER RIGHT INTERNAL JUGULAR (Right) POST CABG  Cv- sinus bradycardia, BP OK, co-ox 53 this AM  RESP- continue IS  RENAL- acute renal failure in setting of CKD- HD tomorrow  ENDO- CBG well controlled  A little unsteady with  ambulation, continue with assistance   LOS: 14 days    Melrose Nakayama 06/14/2016

## 2016-06-14 NOTE — Progress Notes (Signed)
Pt ECG monitor showing Pt leads off and immediate staff unavailable. Pt found off monitor and in bathroom standing and holding doorway. Pt states there was no fall and he "just had to use bathroom and no bother no one". Pt assisted back to bed and re-attached to monitor and re-informed the urgency to call staff for bowel movements and the necessity of following agreed safety plan. Call bell placed back in reach along with bedside table. Bed exit alarm activated and Pt advised to call when needed. Will continue to monitor and assess.

## 2016-06-14 NOTE — Progress Notes (Signed)
Right  Upper Extremity Vein Map    Cephalic  Segment Diameter Depth Comment  1. Axilla 3.74mm 5.80mm   2. Mid upper arm 3.35mm 3.50mm   3. Above AC 2.78mm 5.48mm   4. In AC 2.19mm 3.76mm    5. Below AC 2.99mm 2.31mm branch  6. Mid forearm 2.87mm 5.61mm    7. Wrist 1.24mm 3.67mm    mm mm    mm mm    mm mm    Basilic  Segment Diameter Depth Comment  1. Axilla mm mm   2. Mid upper arm/Origin 3.39mm 7.42mm   3. Above AC 2.62mm 3.61mm   4. In AC 2.62mm 1.78mm    5. Below AC 1.47mm 4.39mm    6. Mid forearm 66mm 3.66mm   7. Wrist 1.66mm 2.36mm    mm mm    mm mm    mm mm    Left Upper Extremity Vein Map    Cephalic  Segment Diameter Depth Comment  1. Axilla 2.71mm 3.44mm   2. Mid upper arm 70mm 19mm   3. Above AC 3.46mm 92mm   4. In [facility] 3.44mm 5.78mm Multiple branches  5. Below AC 3.76mm 62mm branch  6. Mid forearm 60mm 4.42mm branch  7. Wrist 1.79mm 2.36mm    mm mm    mm mm    mm mm    Basilic  Segment Diameter Depth Comment  1. Origin 4.20mm 45mm   2. Mid upper arm 3.18mm 62mm   3. Above AC 3.72mm 72mm   4. In AC 1.94mm 57mm branch  5. Below AC 2.24mm 2.39mm   6. Mid forearm 1.81mm 69mm   7. Wrist mm mm    mm mm    mm mm    mm mm

## 2016-06-14 NOTE — Progress Notes (Signed)
Subjective Interval History: has no complaint , had long day yest.  Objective: Vital signs in last 24 hours: Temp:  [97.3 F (36.3 C)-98.7 F (37.1 C)] 98.7 F (37.1 C) (10/01 0035) Pulse Rate:  [56-113] 56 (10/01 0700) Resp:  [9-28] 14 (10/01 0700) BP: (86-141)/(33-79) 117/43 (10/01 0700) SpO2:  [90 %-99 %] 91 % (10/01 0700) Weight:  [61.8 kg (136 lb 3.9 oz)-63 kg (138 lb 14.2 oz)] 61.8 kg (136 lb 3.9 oz) (09/30 2215) Weight change: 0.4 kg (14.1 oz)  Intake/Output from previous day: 09/30 0701 - 10/01 0700 In: 760 [P.O.:560; I.V.:200] Out: 1305 [Blood:5] Intake/Output this shift: No intake/output data recorded.  General appearance: alert, cooperative, no distress and pale Resp: rales bibasilar Chest wall: RIJ cath Cardio: S1, S2 normal and systolic murmur: holosystolic 2/6, blowing at apex GI:1+soft,pos bs, liver down 4 cm Extremities: edema 1+ and Homans sign is negative, no sign of DVT  Lab Results:  Recent Labs  06/13/16 0416 06/14/16 0430  WBC 12.2* 12.9*  HGB 9.7* 10.1*  HCT 29.7* 31.4*  PLT 269 245   BMET:  Recent Labs  06/13/16 0416 06/14/16 0430  NA 136 138  K 3.8 4.0  CL 105 104  CO2 25 25  GLUCOSE 154* 132*  BUN 38* 17  CREATININE 3.22* 2.17*  CALCIUM 8.0* 7.7*   No results for input(s): PTH in the last 72 hours. Iron Studies: No results for input(s): IRON, TIBC, TRANSFERRIN, FERRITIN in the last 72 hours.  Studies/Results: Dg Chest Port 1 View  Result Date: 06/13/2016 CLINICAL DATA:  Postop dialysis catheter placement. EXAM: PORTABLE CHEST 1 VIEW COMPARISON:  Same day. FINDINGS: Stable cardiomediastinal silhouette. Status post coronary artery bypass graft. No pneumothorax is noted. Left internal jugular catheter is unchanged with distal tip in expected position of left innominate vein. Right internal dialysis catheter is noted with distal tip in expected position of cavoatrial junction. Bibasilar atelectasis is noted with minimal bilateral  pleural effusions. Bony thorax is unremarkable. IMPRESSION: Right internal jugular dialysis catheter is noted with distal tip in expected position of cavoatrial junction. No pneumothorax is noted. Bibasilar subsegmental atelectasis and minimal effusions are noted. Electronically Signed   By: Marijo Conception, M.D.   On: 06/13/2016 16:24   Dg Chest Port 1 View  Result Date: 06/13/2016 CLINICAL DATA:  06/05/16 CORONARY ARTERY BYPASS GRAFTING (CABG) x3 LIMA to LAD -SVG to OM -SVG to RCA and 06/13/16 INSERTION OF DIALYSIS CATHETER. Chest pain and SOB. EXAM: PORTABLE CHEST 1 VIEW COMPARISON:  06/11/2016 FINDINGS: Changes from CABG surgery stable. No mediastinal widening. No mediastinal or hilar masses. There is opacity at both lung bases consistent with combination of atelectasis and pleural fluid. No convincing change from prior study allowing for differences in patient positioning and radiographic technique. No evidence of pulmonary edema or pneumonia. No pneumothorax. Right subclavian dual lumen central venous catheter and left internal jugular central venous catheter are stable. IMPRESSION: 1. No significant change from prior study. Persistent pleural effusions with associated basilar atelectasis. No convincing pneumonia or pulmonary edema. Electronically Signed   By: Lajean Manes M.D.   On: 06/13/2016 08:14    I have reviewed the patient's current medications.  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 Will plan in am.  Will get perm access next week. 2 CAD/CABG per [facility] 3 Anemia stable 4 DM controlled 5 HPTH vit D 6 Afib P Hd , map, access,  Mobilize.     LOS:  14 days   Kenneth [name] 06/14/2016,7:33 AM

## 2016-06-14 NOTE — Progress Notes (Signed)
      [address]       [place] [address]             [phone]      No complaints  BP (!) 116/48 (BP Location: Right Arm)   Pulse (!) 47   Temp 98.5 F (36.9 C) (Oral)   Resp 13   Ht 5\' 6"  (1.676 m)   Wt 136 lb 3.9 oz (61.8 kg)   SpO2 91%   BMI 21.99 kg/m   PO intake still poor  Remo Lipps C. Roxan Hockey, MD [facility] [phone]

## 2016-06-14 NOTE — Progress Notes (Signed)
Pt received from H/D via hospital bed and transporter after report received. Pt already attached to unit monitor, assessment completed and call bell and B.S. Table within.

## 2016-06-15 LAB — RENAL FUNCTION PANEL
Albumin: 2 g/dL — ABNORMAL LOW (ref 3.5–5.0)
Anion gap: 8 (ref 5–15)
BUN: 30 mg/dL — AB (ref 6–20)
CHLORIDE: 101 mmol/L (ref 101–111)
CO2: 25 mmol/L (ref 22–32)
CREATININE: 3.48 mg/dL — AB (ref 0.61–1.24)
Calcium: 8 mg/dL — ABNORMAL LOW (ref 8.9–10.3)
GFR calc Af Amer: 21 mL/min — ABNORMAL LOW (ref >60)
GFR, EST NON AFRICAN AMERICAN: 18 mL/min — AB (ref >60)
GLUCOSE: 101 mg/dL — AB (ref 65–99)
Phosphorus: 4.1 mg/dL (ref 2.5–4.6)
Potassium: 3.9 mmol/L (ref 3.5–5.1)
Sodium: 134 mmol/L — ABNORMAL LOW (ref 135–145)

## 2016-06-15 LAB — GLUCOSE, CAPILLARY
Glucose-Capillary: 104 mg/dL — ABNORMAL HIGH (ref 65–99)
Glucose-Capillary: 124 mg/dL — ABNORMAL HIGH (ref 65–99)
Glucose-Capillary: 145 mg/dL — ABNORMAL HIGH (ref 65–99)
Glucose-Capillary: 93 mg/dL (ref 65–99)
Glucose-Capillary: 93 mg/dL (ref 65–99)

## 2016-06-15 LAB — CARBOXYHEMOGLOBIN
CARBOXYHEMOGLOBIN: 1.4 % (ref 0.5–1.5)
METHEMOGLOBIN: 1 % (ref 0.0–1.5)
O2 Saturation: 60.1 %
Total hemoglobin: 9.4 g/dL — ABNORMAL LOW (ref 12.0–16.0)

## 2016-06-15 LAB — CBC
HCT: 29.7 % — ABNORMAL LOW (ref 39.0–52.0)
HEMOGLOBIN: 9.5 g/dL — AB (ref 13.0–17.0)
MCH: 28.9 pg (ref 26.0–34.0)
MCHC: 32 g/dL (ref 30.0–36.0)
MCV: 90.3 fL (ref 78.0–100.0)
PLATELETS: 289 10*3/uL (ref 150–400)
RBC: 3.29 MIL/uL — AB (ref 4.22–5.81)
RDW: 14.5 % (ref 11.5–15.5)
WBC: 11.4 10*3/uL — ABNORMAL HIGH (ref 4.0–10.5)

## 2016-06-15 LAB — MAGNESIUM: MAGNESIUM: 2.4 mg/dL (ref 1.7–2.4)

## 2016-06-15 MED ORDER — NEPRO/CARBSTEADY PO LIQD: 237.0000 mL | ORAL | Status: DC

## 2016-06-15 MED ORDER — PRO-STAT SUGAR FREE PO LIQD
30.0000 mL | Freq: Two times a day (BID) | ORAL | Status: DC
  Administered 2016-06-15 – 2016-06-17 (×4): 30 mL
  Filled 2016-06-15 (×5): qty 30

## 2016-06-15 MED ORDER — NEPRO/CARBSTEADY PO LIQD
1000.0000 mL | ORAL | Status: DC
  Filled 2016-06-15 (×3): qty 1000

## 2016-06-15 MED ORDER — AMIODARONE HCL 200 MG PO TABS
200.0000 mg | ORAL_TABLET | Freq: Every day | ORAL | Status: DC
  Filled 2016-06-15: qty 1

## 2016-06-15 MED ORDER — NEPRO/CARBSTEADY PO LIQD
1000.0000 mL | ORAL | Status: DC
  Filled 2016-06-15 (×2): qty 1000

## 2016-06-15 MED ORDER — NEPRO/CARBSTEADY PO LIQD: 1000.0000 mL | ORAL | Status: DC

## 2016-06-15 NOTE — Progress Notes (Signed)
Patient ID: Kenneth [name], male   DOB: [birth year], 60 y.o.   MRN: [id number] EVENING ROUNDS NOTE :     [address]       [place] [address]             [phone]                 2 Days Post-Op Procedure(s) (LRB): INSERTION OF DIALYSIS CATHETER RIGHT INTERNAL JUGULAR (Right)  Total Length of Stay:  LOS: 15 days  BP 124/64   Pulse 72   Temp 98.1 F (36.7 C) (Oral)   Resp 20   Ht 5\' 6"  (1.676 m)   Wt 137 lb 5.6 oz (62.3 kg)   SpO2 97%   BMI 22.17 kg/m   .Intake/Output      10/02 0701 - 10/03 0700   P.O. 240   Total Intake(mL/kg) 240 (3.9)   Other 1500   Total Output 1500   Net -1260         . sodium chloride 250 mL (06/12/16 0741)  . sodium chloride 10 mL/hr at 06/13/16 0933     Lab Results  Component Value Date   WBC 11.4 (H) 06/15/2016   HGB 9.5 (L) 06/15/2016   HCT 29.7 (L) 06/15/2016   PLT 289 06/15/2016   GLUCOSE 101 (H) 06/15/2016   TRIG 132 06/03/2016   ALT 47 06/13/2016   AST 48 (H) 06/13/2016   NA 134 (L) 06/15/2016   K 3.9 06/15/2016   CL 101 06/15/2016   CREATININE 3.48 (H) 06/15/2016   BUN 30 (H) 06/15/2016   CO2 25 06/15/2016   INR 1.10 06/12/2016   HGBA1C 10.4 (H) 06/04/2016   Dialysis cath in place Waiting for bed to step down  [name] MD  Beeper [phone] Office [phone] 06/15/2016 8:40 PM

## 2016-06-15 NOTE — Significant Event (Signed)
Patient off unit when RN arrived to room for placement of cortrak feeding tube. Will re-attempt when patient returns to unit.

## 2016-06-15 NOTE — Progress Notes (Signed)
2 Days Post-Op Procedure(s) (LRB): INSERTION OF DIALYSIS CATHETER RIGHT INTERNAL JUGULAR (Right) Subjective: A pacing for sinus bradycardia-amiodarone dose modified Patient refuses to eat, feeding tube to be placed with continuous Nepro tube feeds Chest x-ray clear Incisions healing Remains very weak and needs assistance 2 to get out of bed  Objective: Vital signs in last 24 hours: Temp:  [97.5 F (36.4 C)-98.6 F (37 C)] 98.1 F (36.7 C) (10/02 1543) Pulse Rate:  [44-73] 72 (10/02 1303) Cardiac Rhythm: Normal sinus rhythm (10/02 1309) Resp:  [8-19] 13 (10/02 1303) BP: (107-166)/(34-77) 126/71 (10/02 1303) SpO2:  [90 %-95 %] 93 % (10/02 1303) Weight:  [137 lb 5.6 oz (62.3 kg)] 137 lb 5.6 oz (62.3 kg) (10/02 0853)  Hemodynamic parameters for last 24 hours:  blood pressure stable  Intake/Output from previous day: 10/01 0701 - 10/02 0700 In: 510 [P.O.:510] Out: -  Intake/Output this shift: Total I/O In: -  Out: 1500 [Other:1500]       Exam    General- alert and comfortable   Lungs- clear without rales, wheezes   Cor- regular rate and rhythm, no murmur , gallop   Abdomen- soft, non-tender   Extremities - warm, non-tender, minimal edema   Neuro- oriented, appropriate, no focal weakness   Lab Results:  Recent Labs  06/14/16 0430 06/15/16 0355  WBC 12.9* 11.4*  HGB 10.1* 9.5*  HCT 31.4* 29.7*  PLT 245 289   BMET:  Recent Labs  06/14/16 0430 06/15/16 0400  NA 138 134*  K 4.0 3.9  CL 104 101  CO2 25 25  GLUCOSE 132* 101*  BUN 17 30*  CREATININE 2.17* 3.48*  CALCIUM 7.7* 8.0*    PT/INR: No results for input(s): LABPROT, INR in the last 72 hours. ABG    Component Value Date/Time   PHART 7.439 06/09/2016 1247   HCO3 26.3 06/09/2016 1247   TCO2 27 06/09/2016 1247   ACIDBASEDEF 3.0 (H) 06/09/2016 1118   O2SAT 60.1 06/15/2016 0330   CBG (last 3)   Recent Labs  06/15/16 0345 06/15/16 0837 06/15/16 1538  GLUCAP 93 93 104*     Assessment/Plan: S/P Procedure(s) (LRB): INSERTION OF DIALYSIS CATHETER RIGHT INTERNAL JUGULAR (Right) Tube feeds, asked the dietitian for food preferences Continue HD per renal Physical therapy to improve strength Transfer to stepdown patient is able to ambulate with assistance 1   LOS: 15 days    Tharon Aquas Trigt III 06/15/2016

## 2016-06-15 NOTE — Procedures (Signed)
Patient was seen on dialysis and the procedure was supervised.  BFR 400  Via PC BP is  122/66.   Patient appears to be tolerating treatment well  Charmelle Soh A 06/15/2016

## 2016-06-15 NOTE — Progress Notes (Signed)
   Daily Progress Note   Vein mapping compatible with L BC AVF  - Pt not interesting any further surgery at this point - Contact us when pt willing to proceed  Adele Barthel, MD, [place] [facility] [place] Office: [phone] Pager: [phone]  06/15/2016, 8:03 AM

## 2016-06-15 NOTE — Progress Notes (Signed)
Patient ID: Kenneth [name], male   DOB: 05-31-1956, 60 y.o.   MRN: [id number]     Advanced Heart Failure Rounding Note  Referring Physician: Raylene Miyamoto, MD  Primary Physician: Rodena Medin, MD Primary HF: New ( Dr Aundra Dubin)  Reason for Consultation: Acute respiratory failure  Subjective:    Kenneth [name] is a 60 y.o. male with hx of poorly controlled diabetes, HTN, CKD IV, and HLD.  Admitted 05/31/16 with orthopnea, DOE, and CP. EKG did not meet stemi criteria. Troponin 1.4. Echo showed global LV dysfunction. Started on NIMV for acute respiratory failure, failed requiring intubation.   Echo 05/31/16 LVEF 35-40%, Grade 2 DD  LHC 05/31/16 showed LM lesion, 40 %stenosed, Ramus lesion, 80 %stenosed, Ost Cx to Prox Cx lesion, 80 %stenosed, Mid LAD lesion, 90 %stenosed, Ost 2nd Diag to 2nd Diag lesion, 90 %stenosed, Prox RCA to Mid RCA lesion, 100 %stenosed. IABP inserted.   Atrial fibrillation with RVR developed 9/18.  Thoracentesis on right on 9/19,  transudative (1000 cc off).   Thoracentesis on left on 9/21, 600 cc off.   Underwent CABG x 3. 9/22  IABP removed 06/07/16. Extubated 06/09/16. Off milrinone with stable CO-OX 60%.   Toleration intermittent HD. Denies SOB.      Objective:   Weight Range: 137 lb 5.6 oz (62.3 kg) Body mass index is 22.17 kg/m.   Vital Signs:   Temp:  [97.5 F (36.4 C)-98.6 F (37 C)] 98 F (36.7 C) (10/02 1303) Pulse Rate:  [44-73] 72 (10/02 1303) Resp:  [8-19] 13 (10/02 1303) BP: (107-166)/(34-77) 126/71 (10/02 1303) SpO2:  [90 %-95 %] 93 % (10/02 1303) Weight:  [137 lb 5.6 oz (62.3 kg)] 137 lb 5.6 oz (62.3 kg) (10/02 0853) Last BM Date: 06/14/16  Weight change: Filed Weights   06/13/16 1900 06/13/16 2215 06/15/16 0853  Weight: 138 lb 14.2 oz (63 kg) 136 lb 3.9 oz (61.8 kg) 137 lb 5.6 oz (62.3 kg)    Intake/Output:   Intake/Output Summary (Last 24 hours) at 06/15/16 1539 Last data filed at 06/15/16 1303  Gross per 24 hour  Intake               410 ml  Output             1500 ml  Net            -1090 ml     Physical Exam: General:  NAD HEENT: Normal Neck: supple. JVP flat. Carotids 2+ bilat; Cor: Sternal dressing RRR. No M/G/R.  Lungs: Diminished throughout   Abdomen: MIldly distended. Extremities: no cyanosis, clubbing, rash. No edema.  Neuro: alert & orientedx3, cranial nerves grossly intact. moves all 4 extremities w/o difficulty. Affect pleasant  Telemetry: Reviewed, NSR this morning, had afib last night  Labs: CBC  Recent Labs  06/14/16 0430 06/15/16 0355  WBC 12.9* 11.4*  HGB 10.1* 9.5*  HCT 31.4* 29.7*  MCV 90.2 90.3  PLT 245 671   Basic Metabolic Panel  Recent Labs  06/13/16 0416 06/14/16 0430 06/15/16 0400  NA 136 138 134*  K 3.8 4.0 3.9  CL 105 104 101  CO2 25 25 25   GLUCOSE 154* 132* 101*  BUN 38* 17 30*  CREATININE 3.22* 2.17* 3.48*  CALCIUM 8.0* 7.7* 8.0*  MG 2.5*  --  2.4  PHOS 3.6 2.6 4.1   Liver Function Tests  Recent Labs  06/13/16 0416 06/14/16 0430 06/15/16 0400  AST 48*  --   --  ALT 47  --   --   ALKPHOS 124  --   --   BILITOT 0.5  --   --   PROT 4.9*  --   --   ALBUMIN 1.9* 2.0* 2.0*   No results for input(s): LIPASE, AMYLASE in the last 72 hours. Cardiac Enzymes No results for input(s): CKTOTAL, CKMB, CKMBINDEX, TROPONINI in the last 72 hours.  BNP: BNP (last 3 results)  Recent Labs  05/31/16 1435  BNP 2,537.9*    ProBNP (last 3 results) No results for input(s): PROBNP in the last 8760 hours.   D-Dimer No results for input(s): DDIMER in the last 72 hours. Hemoglobin A1C No results for input(s): HGBA1C in the last 72 hours. Fasting Lipid Panel No results for input(s): CHOL, HDL, LDLCALC, TRIG, CHOLHDL, LDLDIRECT in the last 72 hours. Thyroid Function Tests No results for input(s): TSH, T4TOTAL, T3FREE, THYROIDAB in the last 72 hours.  Invalid input(s): FREET3  Other results:     Imaging/Studies:  Dg Chest Port 1 View  Result  Date: 06/13/2016 CLINICAL DATA:  Postop dialysis catheter placement. EXAM: PORTABLE CHEST 1 VIEW COMPARISON:  Same day. FINDINGS: Stable cardiomediastinal silhouette. Status post coronary artery bypass graft. No pneumothorax is noted. Left internal jugular catheter is unchanged with distal tip in expected position of left innominate vein. Right internal dialysis catheter is noted with distal tip in expected position of cavoatrial junction. Bibasilar atelectasis is noted with minimal bilateral pleural effusions. Bony thorax is unremarkable. IMPRESSION: Right internal jugular dialysis catheter is noted with distal tip in expected position of cavoatrial junction. No pneumothorax is noted. Bibasilar subsegmental atelectasis and minimal effusions are noted. Electronically Signed   By: Marijo Conception, M.D.   On: 06/13/2016 16:24    Latest Echo  Latest Cath   Medications:     Scheduled Medications: . [START ON 06/16/2016] amiodarone  200 mg Oral Daily  . aspirin EC  325 mg Oral Daily   Or  . aspirin  324 mg Per Tube Daily  . atorvastatin  80 mg Oral q1800  . bisacodyl  10 mg Oral Daily   Or  . bisacodyl  10 mg Rectal Daily  . carvedilol  3.125 mg Oral BID WC  . chlorhexidine  15 mL Mouth Rinse BID  . darbepoetin (ARANESP) injection - DIALYSIS  100 mcg Intravenous Q Sat-HD  . docusate sodium  200 mg Oral Daily  . feeding supplement (NEPRO CARB STEADY)  1,000 mL Per Tube Q24H  . feeding supplement (PRO-STAT SUGAR FREE 64)  30 mL Per Tube BID  . ferumoxytol  510 mg Intravenous Weekly  . heparin  40 Units/kg Dialysis Once in dialysis  . heparin subcutaneous  5,000 Units Subcutaneous Q12H  . insulin aspart  0-15 Units Subcutaneous Q4H  . insulin detemir  6 Units Subcutaneous BID  . isosorbide mononitrate  15 mg Oral Daily  . mouth rinse  15 mL Mouth Rinse q12n4p  . multivitamin  1 tablet Oral QHS  . pantoprazole  40 mg Oral Q1200  . sodium chloride flush  10-40 mL Intracatheter Q12H     Infusions: . sodium chloride 250 mL (06/12/16 0741)  . sodium chloride 10 mL/hr at 06/13/16 0933    PRN Medications: sodium chloride, sodium chloride, heparin, Influenza vac split quadrivalent PF, labetalol, lidocaine (PF), lidocaine-prilocaine, morphine injection, ondansetron (ZOFRAN) IV, oxyCODONE, pentafluoroprop-tetrafluoroeth, sodium chloride flush, traMADol   Assessment/Plan   QUINT CHESTNUT is a 60 y.o. male with hx of poorly  controlled diabetes, HTN, CKD IV, and HLD.  Admitted 05/31/16 with orthopnea, DOE, and CP.  Intubated, sedated, placed on IABP.  S/p CABG x 3 9/22 (LIMA-LAD, SVG-OM, SVG-RCA)  1. Acute on chronic systolic CHF with cardiogenic shock: EF 35-40% with wall motion abnormalities, ischemic cardiomyopathy.  IABP out. s/p CABG x 3 on 9/22. Co-ox 60% with   - Off milrinone.   - Off hydralazine due to rash. Continue Imdur 15 mg daily.  - On Coreg 3.125 mg bid.  2. CAD: Admitted with NSTEMI, severe 3VD. S/p 3v CABG 9/22 - ASA, atorvastatin 80.  3. AKI on CKD stage IV:  On intermittent HD. Baseline creatinine seems to be around 2.5.    - Renal following.  4. Acute respiratory failure: right thoracentesis and left thoracentesis done.   - Failed wean 06/07/16 as went into Afib RVR. Extubated 12/13/00 without complication.  5. Anemia: Hgb stable.  6. ID: Remains afebrile with Serratia in sputum. Completed abx.  7. Atrial fibrillation: Paroxysmal. Atrial fibrillation last night, now back in NSR.    - Continue amiodarone to 400 mg bid.   Length of Stay: [place], NP-C  06/15/2016, 3:39 PM  Advanced Heart Failure Team Pager [phone] (M-F; 7a - 4p)  Please contact [facility] for night-coverage after hours (4p -7a ) and weekends on [email or web address]  Patient seen with PA, agree with the above note.  He is now getting intermittent HD, tolerating well.  He is on low dose Coreg. Amiodarone is maintaining NSR.  On ASA and atorvastatin.  Stable at this point.  We will follow  at a distance, when discharged will need cardiology followup.   Loralie Champagne 06/15/2016 3:55 PM

## 2016-06-15 NOTE — Progress Notes (Signed)
Physical Therapy Treatment Patient Details Name: Kenneth [name] MRN: [id number] DOB: Sep 15, 1955 Today's Date: 06/15/2016    History of Present Illness Pt adm with SOB and required emergent intubation and cardiac cath. Underwent CABG on 06/05/16. Requiring CRRT post op, discontinued 9/29. PMH - DM, CKD, HTN    PT Comments    Pt moving well and able to ambulate unit with walker today. Pt demonstrates difficulty adhering to all precautions and educated throughout session with ability to state 3/5 end of session. Pt educated for HEP, transfers, gait and HEP to progress independence.   HR 72 paced throughout session sats 92% on RA BP 141/65 before 158/70 after  Follow Up Recommendations  Home health PT;Supervision/Assistance - 24 hour     Equipment Recommendations  Rolling walker with 5" wheels    Recommendations for Other Services       Precautions / Restrictions Precautions Precautions: Fall;Sternal    Mobility  Bed Mobility Overal bed mobility: Needs Assistance Bed Mobility: Rolling;Sidelying to Sit;Sit to Sidelying Rolling: Min guard Sidelying to sit: Min guard     Sit to sidelying: Min guard General bed mobility comments: cues for sequence, precautions and safety  Transfers Overall transfer level: Needs assistance   Transfers: Sit to/from Stand Sit to Stand: Min guard         General transfer comment: cues for hand placement and safety  Ambulation/Gait Ambulation/Gait assistance: Min assist Ambulation Distance (Feet): 300 Feet Assistive device: Rolling walker (2 wheeled) Gait Pattern/deviations: Step-through pattern;Decreased stride length   Gait velocity interpretation: Below normal speed for age/gender General Gait Details: cues for posture and position in RW, pt with tendency to be too posterior to [employer]            Wheelchair Mobility    Modified Rankin (Stroke Patients Only)       Balance Overall balance assessment: Needs  assistance   Sitting balance-Leahy Scale: Good       Standing balance-Leahy Scale: Fair                      Cognition Arousal/Alertness: Awake/alert Behavior During Therapy: WFL for tasks assessed/performed Overall Cognitive Status: No family/caregiver present to determine baseline cognitive functioning       Memory: Decreased recall of precautions Following Commands: Follows one step commands consistently            Exercises General Exercises - Lower Extremity Long Arc Quad: AROM;Both;Seated;15 reps Hip ABduction/ADduction: AROM;Both;Supine;15 reps Hip Flexion/Marching: AROM;15 reps;Both;Seated    General Comments        Pertinent Vitals/Pain Pain Assessment: No/denies pain    Home Living                      Prior Function            PT Goals (current goals can now be found in the care plan section) Progress towards PT goals: Progressing toward goals    Frequency           PT Plan Discharge plan needs to be updated    Co-evaluation             End of Session Equipment Utilized During Treatment: Gait belt Activity Tolerance: Patient tolerated treatment well Patient left: in bed;with call bell/phone within reach;with bed alarm set     Time: 0805-0825 PT Time Calculation (min) (ACUTE ONLY): 20 min  Charges:  $Gait Training: 8-22 mins  G CodesLanetta Inch Beth 07/03/2016, 9:09 AM Elwyn Reach, [place]

## 2016-06-15 NOTE — Progress Notes (Addendum)
Nutrition Follow Up  DOCUMENTATION CODES:   Not applicable  INTERVENTION:    Once CORTRAK tube placed, re-start Nepro at goal rate of 35 ml/h (840 ml per day) and Prostat 30 ml BID to provide 1712 kcals, 98 gm protein, 584 ml free water daily.  NUTRITION DIAGNOSIS:   Inadequate oral intake now related to poor appetite as evidenced by meal completion < 25%, ongoing  GOAL:   Patient will meet greater than or equal to 90% of their needs, progressing   MONITOR:   TF tolerance, PO intake, Labs, Weight trends, Skin, I & O's  ASSESSMENT:   60 year old man with a history of poorly controlled diabetes, hypertension and hyperlipidemia. Over the past few weeks, he had not been feeling well. He thought he got the flu. He was seen at an urgent care in [facility] and was treated for bronchitis. Over the past several days, he has felt worse. He has been unable to lie flat. He had chest discomfort when lying flat. Both shortness of breath and chest discomfort were better when sitting forward.  He also noted lower extremity edema.  Pt with 3 vessel CAD, s/p CABG 9/22. Extubated 9/26. Diet advanced to Dys 2-thin per Speech Path 9/28. PO intake very poor at 10% per flowsheet records. S/p R internal jugular vein tunneled HD catheter placement 9/30. CORTRAK small bore feeding tube re-placement pending >> orders to re-start Nepro formula.  CBG (last 3)   Recent Labs  06/15/16 0014 06/15/16 0345 06/15/16 0837  GLUCAP 124* 93 93    Diet Order:  Diet renal/carb modified with fluid restriction Diet-HS Snack? Nothing; Room service appropriate? Yes; Fluid consistency: Thin  Skin:  Reviewed, no issues  Last BM:  10/1  Height:   Ht Readings from Last 1 Encounters:  05/31/16 5\' 6"  (1.676 m)    Weight:   Wt Readings from Last 1 Encounters:  06/15/16 137 lb 5.6 oz (62.3 kg)    Ideal Body Weight:  64.5 kg  BMI:  Body mass index is 22.17 kg/m.  Estimated Nutritional Needs:    Kcal:  1700-1900  Protein:  90-100 gm  Fluid:  per MD  EDUCATION NEEDS:   No education needs identified at this time  Arthur Holms, RD, LDN Pager #: [phone] After-Hours Pager #: [phone]

## 2016-06-15 NOTE — Significant Event (Addendum)
Attempted to place Cortrak feeding tube. Patient adamantly refuses for RN to insert tube, saying "no, no" and moving his head all over. Patient educated on the importance of nutrition. Patient nods in agreement. Still refusing after educating. Primary RN Altha Harm made aware. Patient assisted to dinner tray by staff.  Please call cortrak team should cortrak feeding tube is needed again for the patient. Thank you.

## 2016-06-15 NOTE — Progress Notes (Signed)
Subjective:  On schedule for second IHD treatment today-  ambulating in hall - says feels much better- not sure he grasped the concept of what I am saying regarding dialysis Objective Vital signs in last 24 hours: Vitals:   06/15/16 0400 06/15/16 0500 06/15/16 0600 06/15/16 0700  BP: (!) 127/56 (!) 117/44 (!) 134/54 (!) 143/52  Pulse: (!) 47 (!) 46 (!) 44 (!) 44  Resp: 19 13 12  (!) 9  Temp:      TempSrc:      SpO2: 94% 90% 95% 91%  Weight:      Height:       Weight change:   Intake/Output Summary (Last 24 hours) at 06/15/16 0815 Last data filed at 06/14/16 2200  Gross per 24 hour  Intake              510 ml  Output                0 ml  Net              510 ml    Assessment/ Plan: Pt is a 60 y.o. yo male with HTN, DM and CKD 4 who was admitted on 05/31/2016 with NSTEMI required semiemergent CABG  Assessment/Plan: 1. Renal- CRRT started 9/23 after CABG- then transitioned to IHD- first tx 9/30- second planned for today - has PC- plans were being made for perm access but pt declines right now.  No uop - preop creatinine in the high 3's top 4's.  Still suspect this will be ESRD given his level of dysfunction prior to major insults but not out of ream of possibility he could improve- will need to continue with discussions of plans for permanent HD/access- will start CLIP as well 2. HTN/vol- BP good- low dose coreg only  3. Anemia- hgb trending down - on iron and aranesp 4. Secondary hyperparathyroidism- calc/phos are OK - PTH was 66- no meds 5. S/p CABG- seems to be progressing well- per CTS    Kenneth [name] A    Labs: Basic Metabolic Panel:  Recent Labs Lab 06/13/16 0416 06/14/16 0430 06/15/16 0400  NA 136 138 134*  K 3.8 4.0 3.9  CL 105 104 101  CO2 25 25 25   GLUCOSE 154* 132* 101*  BUN 38* 17 30*  CREATININE 3.22* 2.17* 3.48*  CALCIUM 8.0* 7.7* 8.0*  PHOS 3.6 2.6 4.1   Liver Function Tests:  Recent Labs Lab 06/11/16 0448  06/12/16 0428  06/13/16 0416  06/14/16 0430 06/15/16 0400  AST 38  --  48*  --  48*  --   --   ALT 38  --  44  --  47  --   --   ALKPHOS 138*  --  149*  --  124  --   --   BILITOT 1.1  --  1.0  --  0.5  --   --   PROT 5.2*  --  5.2*  --  4.9*  --   --   ALBUMIN 2.0*  2.0*  < > 2.0*  < > 1.9* 2.0* 2.0*  < > = values in this interval not displayed. No results for input(s): LIPASE, AMYLASE in the last 168 hours. No results for input(s): AMMONIA in the last 168 hours. CBC:  Recent Labs Lab 06/11/16 0448 06/12/16 0428 06/13/16 0416 06/14/16 0430 06/15/16 0355  WBC 16.1* 12.4* 12.2* 12.9* 11.4*  HGB 11.4* 10.7* 9.7* 10.1* 9.5*  HCT 35.1* 32.8* 29.7* 31.4* 29.7*  MCV  89.1 89.6 88.9 90.2 90.3  PLT 245 266 269 245 289   Cardiac Enzymes: No results for input(s): CKTOTAL, CKMB, CKMBINDEX, TROPONINI in the last 168 hours. CBG:  Recent Labs Lab 06/14/16 1244 06/14/16 1642 06/14/16 2027 06/15/16 0014 06/15/16 0345  GLUCAP 166* 125* 132* 124* 93    Iron Studies: No results for input(s): IRON, TIBC, TRANSFERRIN, FERRITIN in the last 72 hours. Studies/Results: Dg Chest Port 1 View  Result Date: 06/13/2016 CLINICAL DATA:  Postop dialysis catheter placement. EXAM: PORTABLE CHEST 1 VIEW COMPARISON:  Same day. FINDINGS: Stable cardiomediastinal silhouette. Status post coronary artery bypass graft. No pneumothorax is noted. Left internal jugular catheter is unchanged with distal tip in expected position of left innominate vein. Right internal dialysis catheter is noted with distal tip in expected position of cavoatrial junction. Bibasilar atelectasis is noted with minimal bilateral pleural effusions. Bony thorax is unremarkable. IMPRESSION: Right internal jugular dialysis catheter is noted with distal tip in expected position of cavoatrial junction. No pneumothorax is noted. Bibasilar subsegmental atelectasis and minimal effusions are noted. Electronically Signed   By: Marijo Conception, M.D.   On: 06/13/2016 16:24    Medications: Infusions: . sodium chloride 250 mL (06/12/16 0741)  . sodium chloride 10 mL/hr at 06/13/16 0933  . feeding supplement (NEPRO CARB STEADY)      Scheduled Medications: . [START ON 06/16/2016] amiodarone  200 mg Oral Daily  . aspirin EC  325 mg Oral Daily   Or  . aspirin  324 mg Per Tube Daily  . atorvastatin  80 mg Oral q1800  . bisacodyl  10 mg Oral Daily   Or  . bisacodyl  10 mg Rectal Daily  . carvedilol  3.125 mg Oral BID WC  . chlorhexidine  15 mL Mouth Rinse BID  . darbepoetin (ARANESP) injection - DIALYSIS  100 mcg Intravenous Q Sat-HD  . docusate sodium  200 mg Oral Daily  . ferumoxytol  510 mg Intravenous Weekly  . heparin  40 Units/kg Dialysis Once in dialysis  . heparin subcutaneous  5,000 Units Subcutaneous Q12H  . insulin aspart  0-15 Units Subcutaneous Q4H  . insulin detemir  6 Units Subcutaneous BID  . isosorbide mononitrate  15 mg Oral Daily  . mouth rinse  15 mL Mouth Rinse q12n4p  . multivitamin  1 tablet Oral QHS  . pantoprazole  40 mg Oral Q1200  . sodium chloride flush  10-40 mL Intracatheter Q12H    have reviewed scheduled and prn medications.  Physical Exam: General: alert, NAD Heart: RRR Lungs: clear Abdomen: soft, non tender Extremities: no edema Dialysis Access: PC     06/15/2016,8:15 AM  LOS: 15 days

## 2016-06-15 NOTE — Progress Notes (Signed)
Speech Language Pathology Treatment: Dysphagia  Patient Details Name: DELANO FRATE MRN: [id number] DOB: 1956-06-20 Today's Date: 06/15/2016 Time: 3151-7616 SLP Time Calculation (min) (ACUTE ONLY): 11 min  Assessment / Plan / Recommendation Clinical Impression  Pt consumed thin liquids via straw, pureed and regular solids. He had 1 episode of intermittent throat clearing following the initial sip of thin liquid; however, this did not continue even when challenged to take larger sips. Pt displayed no difficulty with pureed or regular solids. RN reports pt has had little oral intake. Given performance at bedside, recommend pt continue on regular diet and thin liquids to aid in nutritional support. SLP will f/u for diet tolerance.   HPI HPI: Pt is 60 y.o.male with hx of poorly controlled diabetes, HTN, CKD IV, and HLD. Admitted 05/31/16 with orthopnea, DOE, and CP. EKG did not meet stemi criteria. Troponin 1.4. Echo showed global LV dysfunction. Started on NIMV for acute respiratory failure, failed requiring intubation on 9/17 and was extubated on 9/26.      SLP Plan  Continue with current plan of care     Recommendations  Diet recommendations: Regular;Thin liquid Liquids provided via: Straw;Cup Medication Administration: Whole meds with puree Supervision: Full supervision/cueing for compensatory strategies;Staff to assist with self feeding Compensations: Minimize environmental distractions;Small sips/bites;Slow rate Postural Changes and/or Swallow Maneuvers: Seated upright 90 degrees;Upright 30-60 min after meal                Oral Care Recommendations: Oral care BID Follow up Recommendations: 24 hour supervision/assistance Plan: Continue with current plan of care       [place], Student SLP  Shela Leff 06/15/2016, 4:43 PM

## 2016-06-15 NOTE — Progress Notes (Signed)
SLP Cancellation Note  Patient Details Name: JUN OSMENT MRN: [id number] DOB: 09/29/1955   Cancelled treatment:       Reason Eval/Treat Not Completed: Patient at procedure or test/unavailable  Ezekiel Slocumb, Student SLP  Shela Leff 06/15/2016, 10:49 AM

## 2016-06-16 ENCOUNTER — Encounter (HOSPITAL_COMMUNITY): Payer: Self-pay | Admitting: Student

## 2016-06-16 ENCOUNTER — Inpatient Hospital Stay (HOSPITAL_COMMUNITY): Payer: BLUE CROSS/BLUE SHIELD

## 2016-06-16 LAB — COMPREHENSIVE METABOLIC PANEL
ALT: 22 U/L (ref 17–63)
AST: 28 U/L (ref 15–41)
Albumin: 2 g/dL — ABNORMAL LOW (ref 3.5–5.0)
Alkaline Phosphatase: 122 U/L (ref 38–126)
Anion gap: 13 (ref 5–15)
BUN: 23 mg/dL — ABNORMAL HIGH (ref 6–20)
CO2: 25 mmol/L (ref 22–32)
Calcium: 8 mg/dL — ABNORMAL LOW (ref 8.9–10.3)
Chloride: 96 mmol/L — ABNORMAL LOW (ref 101–111)
Creatinine, Ser: 2.87 mg/dL — ABNORMAL HIGH (ref 0.61–1.24)
GFR calc Af Amer: 26 mL/min — ABNORMAL LOW (ref >60)
GFR calc non Af Amer: 23 mL/min — ABNORMAL LOW (ref >60)
Glucose, Bld: 176 mg/dL — ABNORMAL HIGH (ref 65–99)
Potassium: 4.4 mmol/L (ref 3.5–5.1)
Sodium: 134 mmol/L — ABNORMAL LOW (ref 135–145)
Total Bilirubin: 1.3 mg/dL — ABNORMAL HIGH (ref 0.3–1.2)
Total Protein: 5.2 g/dL — ABNORMAL LOW (ref 6.5–8.1)

## 2016-06-16 LAB — GLUCOSE, CAPILLARY
Glucose-Capillary: 182 mg/dL — ABNORMAL HIGH (ref 65–99)
Glucose-Capillary: 224 mg/dL — ABNORMAL HIGH (ref 65–99)
Glucose-Capillary: 267 mg/dL — ABNORMAL HIGH (ref 65–99)
Glucose-Capillary: 126 mg/dL — ABNORMAL HIGH (ref 65–99)
Glucose-Capillary: 303 mg/dL — ABNORMAL HIGH (ref 65–99)

## 2016-06-16 LAB — CBC
HCT: 31.8 % — ABNORMAL LOW (ref 39.0–52.0)
Hemoglobin: 10.3 g/dL — ABNORMAL LOW (ref 13.0–17.0)
MCH: 29.5 pg (ref 26.0–34.0)
MCHC: 32.4 g/dL (ref 30.0–36.0)
MCV: 91.1 fL (ref 78.0–100.0)
PLATELETS: 340 10*3/uL (ref 150–400)
RBC: 3.49 MIL/uL — AB (ref 4.22–5.81)
RDW: 14.9 % (ref 11.5–15.5)
WBC: 10.4 10*3/uL (ref 4.0–10.5)

## 2016-06-16 LAB — CARBOXYHEMOGLOBIN
CARBOXYHEMOGLOBIN: 1.5 % (ref 0.5–1.5)
Methemoglobin: 1.6 % — ABNORMAL HIGH (ref 0.0–1.5)
O2 SAT: 70 %
TOTAL HEMOGLOBIN: 10.3 g/dL — AB (ref 12.0–16.0)

## 2016-06-16 MED ORDER — SODIUM CHLORIDE 0.9% FLUSH
3.0000 mL | INTRAVENOUS | Status: DC | PRN
Start: 2016-06-16 — End: 2016-06-18

## 2016-06-16 MED ORDER — SODIUM CHLORIDE 0.9% FLUSH: 3.0000 mL | Freq: Two times a day (BID) | INTRAVENOUS | Status: DC

## 2016-06-16 MED ORDER — CARVEDILOL 6.25 MG PO TABS
6.2500 mg | ORAL_TABLET | Freq: Two times a day (BID) | ORAL | Status: DC
  Administered 2016-06-18: 6.25 mg via ORAL
  Filled 2016-06-16 (×5): qty 1

## 2016-06-16 MED ORDER — MOVING RIGHT ALONG BOOK
Freq: Once | Status: AC
  Administered 2016-06-16: 08:00:00
  Filled 2016-06-16: qty 1

## 2016-06-16 MED ORDER — SODIUM CHLORIDE 0.9% FLUSH
10.0000 mL | INTRAVENOUS | Status: DC | PRN
  Administered 2016-06-17: 20 mL
  Filled 2016-06-16: qty 40

## 2016-06-16 MED ORDER — GUAIFENESIN ER 600 MG PO TB12: 600.0000 mg | ORAL_TABLET | Freq: Two times a day (BID) | ORAL | Status: DC | PRN

## 2016-06-16 MED ORDER — SODIUM CHLORIDE 0.9 % IV SOLN: 250.0000 mL | INTRAVENOUS | Status: DC | PRN

## 2016-06-16 MED ORDER — INSULIN ASPART 100 UNIT/ML ~~LOC~~ SOLN
0.0000 [IU] | Freq: Three times a day (TID) | SUBCUTANEOUS | Status: DC
  Administered 2016-06-16: 2 [IU] via SUBCUTANEOUS
  Administered 2016-06-16: 8 [IU] via SUBCUTANEOUS
  Administered 2016-06-16: 16 [IU] via SUBCUTANEOUS
  Administered 2016-06-16: 12 [IU] via SUBCUTANEOUS
  Administered 2016-06-17: 2 [IU] via SUBCUTANEOUS
  Administered 2016-06-17: 11 [IU] via SUBCUTANEOUS
  Administered 2016-06-17: 8 [IU] via SUBCUTANEOUS
  Administered 2016-06-18 (×2): 4 [IU] via SUBCUTANEOUS
  Administered 2016-06-18 (×2): 8 [IU] via SUBCUTANEOUS
  Administered 2016-06-19: 2 [IU] via SUBCUTANEOUS

## 2016-06-16 MED ORDER — SODIUM CHLORIDE 0.9% FLUSH
10.0000 mL | Freq: Two times a day (BID) | INTRAVENOUS | Status: DC
  Administered 2016-06-17 – 2016-06-20 (×3): 10 mL

## 2016-06-16 NOTE — Progress Notes (Signed)
3 Days Post-Op Procedure(s) (LRB): INSERTION OF DIALYSIS CATHETER RIGHT INTERNAL JUGULAR (Right) Subjective: Made 250 cc urine this am CXR small L effusion Needs A-pacing for sinus brady Ready for stepdown- walked 300 ft  Objective: Vital signs in last 24 hours: Temp:  [97.5 F (36.4 C)-98.4 F (36.9 C)] 98.4 F (36.9 C) (10/03 0700) Pulse Rate:  [69-73] 72 (10/03 0600) Cardiac Rhythm: Atrial paced (10/03 0400) Resp:  [11-23] 17 (10/03 0600) BP: (90-166)/(35-77) 106/54 (10/03 0600) SpO2:  [92 %-98 %] 95 % (10/03 0600) Weight:  [136 lb 14.5 oz (62.1 kg)-137 lb 5.6 oz (62.3 kg)] 136 lb 14.5 oz (62.1 kg) (10/03 0600)  Hemodynamic parameters for last 24 hours:    Intake/Output from previous day: 10/02 0701 - 10/03 0700 In: 18 [P.O.:660] Out: 1500  Intake/Output this shift: Total I/O In: -  Out: 250 [Urine:250]       Exam    General- alert and comfortable   Lungs- clear without rales, wheezes   Cor- regular rate and rhythm, no murmur , gallop   Abdomen- soft, non-tender   Extremities - warm, non-tender, minimal edema   Neuro- oriented, appropriate, no focal weakness   Lab Results:  Recent Labs  06/15/16 0355 06/16/16 0430  WBC 11.4* 10.4  HGB 9.5* 10.3*  HCT 29.7* 31.8*  PLT 289 340   BMET:  Recent Labs  06/15/16 0400 06/16/16 0430  NA 134* 134*  K 3.9 4.4  CL 101 96*  CO2 25 25  GLUCOSE 101* 176*  BUN 30* 23*  CREATININE 3.48* 2.87*  CALCIUM 8.0* 8.0*    PT/INR: No results for input(s): LABPROT, INR in the last 72 hours. ABG    Component Value Date/Time   PHART 7.439 06/09/2016 1247   HCO3 26.3 06/09/2016 1247   TCO2 27 06/09/2016 1247   ACIDBASEDEF 3.0 (H) 06/09/2016 1118   O2SAT 70.0 06/16/2016 0437   CBG (last 3)   Recent Labs  06/15/16 2110 06/16/16 0429 06/16/16 0731  GLUCAP 145* 182* 224*    Assessment/Plan: S/P Procedure(s) (LRB): INSERTION OF DIALYSIS CATHETER RIGHT INTERNAL JUGULAR (Right) Mobilize Diabetes  control Plan for transfer to step-down: see transfer orders stop amiodarone due to low HR   LOS: 16 days    Kenneth [name] 06/16/2016

## 2016-06-16 NOTE — Progress Notes (Signed)
Physical Therapy Treatment Patient Details Name: Kenneth [name] MRN: [id number] DOB: [birth year] Today's Date: 06/16/2016    History of Present Illness Pt adm with SOB and required emergent intubation and cardiac cath. Underwent CABG on 06/05/16. Requiring CRRT post op, discontinued 9/29. PMH - DM, CKD, HTN    PT Comments    Pt remains pleasant and moving well but does not demonstrate understanding of his precautions or ability to recall throughout session. Family not present to state whether current cognitive state is different from baseline. Pt encouraged to ambulate with nursing and perform HEP throughout the day. Will continue to follow.   HR 68-72 with external pacing BP 105/63 before and 133/59 after sats 94-97% on RA  Follow Up Recommendations  Home health PT;Supervision/Assistance - 24 hour     Equipment Recommendations  Rolling walker with 5" wheels    Recommendations for Other Services       Precautions / Restrictions Precautions Precautions: Fall;Sternal    Mobility  Bed Mobility Overal bed mobility: Needs Assistance Bed Mobility: Rolling;Sidelying to Sit Rolling: Supervision Sidelying to sit: Min guard       General bed mobility comments: cues for sequence, precautions and safety  Transfers Overall transfer level: Needs assistance   Transfers: Sit to/from Stand Sit to Stand: Min guard         General transfer comment: cues for hand placement and safety  Ambulation/Gait Ambulation/Gait assistance: Min assist Ambulation Distance (Feet): 300 Feet Assistive device: Rolling walker (2 wheeled)     Gait velocity interpretation: Below normal speed for age/gender General Gait Details: cues for posture and position in RW, pt with tendency to be too posterior to RW, 3 standing rest breaks with gait today and directional cues provided   Stairs            Wheelchair Mobility    Modified Rankin (Stroke Patients Only)       Balance                                    Cognition Arousal/Alertness: Awake/alert Behavior During Therapy: WFL for tasks assessed/performed Overall Cognitive Status: No family/caregiver present to determine baseline cognitive functioning Area of Impairment: Attention;Memory;Following commands   Current Attention Level: Sustained Memory: Decreased recall of precautions Following Commands: Follows one step commands consistently       General Comments: pt unble to recall precautions and does not demonstrate understanding despite education within session    Exercises General Exercises - Lower Extremity Long Arc Quad: AROM;Both;Seated;15 reps Hip ABduction/ADduction: AROM;Both;15 reps;Seated Hip Flexion/Marching: AROM;15 reps;Both;Seated    General Comments        Pertinent Vitals/Pain Pain Assessment: No/denies pain    Home Living                      Prior Function            PT Goals (current goals can now be found in the care plan section) Progress towards PT goals: Progressing toward goals    Frequency           PT Plan Current plan remains appropriate    Co-evaluation             End of Session Equipment Utilized During Treatment: Gait belt Activity Tolerance: Patient tolerated treatment well Patient left: in chair;with call bell/phone within reach;with chair alarm set     Time: 1251-1313 PT Time  Calculation (min) (ACUTE ONLY): 22 min  Charges:  $Gait Training: 8-22 mins                    G Codes:      Melford Aase 2016-06-17, 2:21 PM Elwyn Reach, [place]

## 2016-06-16 NOTE — Progress Notes (Signed)
Occupational Therapy Treatment Patient Details Name: Kenneth [name] MRN: [id number] DOB: 04-14-1956 Today's Date: 06/16/2016    History of present illness Pt adm with SOB and required emergent intubation and cardiac cath. Underwent CABG on 06/05/16. Requiring CRRT post op, discontinued 9/29. PMH - DM, CKD, HTN   OT comments  Pt on phone with his son upon OT's arrival. Son stating that pt speaks and understands English very poorly and will defer to agreeing and nodding his head rather than clarifying. Used son to interpret to pt sternal precautions, but pt with difficulty generalizing. Per son, pt is eager to go to a regular room, to transfer today. Will continue to follow.  Follow Up Recommendations  CIR;Supervision/Assistance - 24 hour    Equipment Recommendations  3 in 1 bedside comode    Recommendations for Other Services      Precautions / Restrictions Precautions Precautions: Fall;Sternal       Mobility Bed Mobility        General bed mobility comments: pt in chair  Transfers Overall transfer level: Needs assistance Equipment used: Rolling walker (2 wheeled) Transfers: Sit to/from Stand Sit to Stand: Min guard         General transfer comment: cues for hand placement and safety    Balance Overall balance assessment: Needs assistance   Sitting balance-Leahy Scale: Good       Standing balance-Leahy Scale: Fair                     ADL Overall ADL's : Needs assistance/impaired     Grooming: Oral care;Standing;Min guard;Brushing hair               Lower Body Dressing: Set up;Sitting/lateral leans Lower Body Dressing Details (indicate cue type and reason): B socks Toilet Transfer: Minimal assistance;RW;Ambulation Toilet Transfer Details (indicate cue type and reason): stood Toileting- Water quality scientist and Hygiene: Minimal assistance Toileting - Clothing Manipulation Details (indicate cue type and reason): standing       General ADL  Comments: Pt stating he had walked in unit x 2 today, fatigued, but willing to work.      Vision                     Perception     Praxis      Cognition   Behavior During Therapy: [facility] for tasks assessed/performed Overall Cognitive Status: Impaired/Different from baseline Area of Impairment: Attention;Memory;Following commands   Current Attention Level: Sustained Memory: Decreased recall of precautions  Following Commands: Follows one step commands consistently       General Comments: Pt's son on phone with pt and interpreted education in sternal precautions. Per son, pt has always indicated he understood [place], but truly does not.    Extremity/Trunk Assessment               Exercises    Shoulder Instructions       General Comments      Pertinent Vitals/ Pain       Pain Assessment: 0-10 Faces Pain Scale: Hurts a little bit Pain Location: chest incision Pain Descriptors / Indicators: Sore Pain Intervention(s): Monitored during session  Home Living                                          Prior Functioning/Environment  Frequency  Min 2X/week        Progress Toward Goals  OT Goals(current goals can now be found in the care plan section)  Progress towards OT goals: Progressing toward goals  Acute Rehab OT Goals Time For Goal Achievement: 06/26/16 Potential to Achieve Goals: Good  Plan Discharge plan remains appropriate    Co-evaluation                 End of Session Equipment Utilized During Treatment: Gait belt;Rolling walker   Activity Tolerance Patient limited by fatigue   Patient Left in chair;with call bell/phone within reach   Nurse Communication          Time: 1219-7588 OT Time Calculation (min): 19 min  Charges: OT General Charges $OT Visit: 1 Procedure OT Treatments $Self Care/Home Management : 8-22 mins  [name] 06/16/2016, 3:02 PM  [phone]

## 2016-06-16 NOTE — Progress Notes (Signed)
Pt transferred to 2W22 with belongings. Report given to receiving RN and all questions answered. VSS during transfer.  Pt assisted to bed in new room. Family updated on patient's location.

## 2016-06-16 NOTE — Progress Notes (Signed)
Subjective:   IHD treatment yesterday- removed 1500 tolerated well- says feels much better- not sure he grasps the concept of what I am saying regarding dialysis- had 250 of UOP today  Objective Vital signs in last 24 hours: Vitals:   06/16/16 0400 06/16/16 0500 06/16/16 0600 06/16/16 0700  BP: 135/66 (!) 121/42 (!) 106/54   Pulse: 69 72 72   Resp: 17 15 17    Temp: 98.2 F (36.8 C)   98.4 F (36.9 C)  TempSrc: Oral   Oral  SpO2: 94% 95% 95%   Weight:   62.1 kg (136 lb 14.5 oz)   Height:       Weight change:   Intake/Output Summary (Last 24 hours) at 06/16/16 0906 Last data filed at 06/16/16 0800  Gross per 24 hour  Intake              660 ml  Output             1750 ml  Net            -1090 ml    Assessment/ Plan: Pt is a 60 y.o. yo male with HTN, DM and CKD 4 who was admitted on 05/31/2016 with NSTEMI required semiemergent CABG  Assessment/Plan: 1. Renal- CRRT started 9/23 after CABG- then transitioned to IHD- first tx 9/30- second 10/2- has PC- plans were being made for perm access but pt declines right now.  No uop previously but now 250 ccs - preop creatinine in the high 3's top 4's.  Still suspect this will be ESRD given his level of dysfunction prior to major insults but not out of ream of possibility he could improve- will need to continue with discussions of plans for permanent HD/access- will start CLIP as well.  Will check labs/UOP tomorrow before ordering routine HD.  Still says he is thinking about AVF/AVG 2. HTN/vol- BP good- low dose coreg only  3. Anemia- hgb trending down - on iron and aranesp 4. Secondary hyperparathyroidism- calc/phos are OK - PTH was 66- no meds 5. S/p CABG- seems to be progressing well- per CTS    Iveth Heidemann A    Labs: Basic Metabolic Panel:  Recent Labs Lab 06/13/16 0416 06/14/16 0430 06/15/16 0400 06/16/16 0430  NA 136 138 134* 134*  K 3.8 4.0 3.9 4.4  CL 105 104 101 96*  CO2 25 25 25 25   GLUCOSE 154* 132* 101* 176*   BUN 38* 17 30* 23*  CREATININE 3.22* 2.17* 3.48* 2.87*  CALCIUM 8.0* 7.7* 8.0* 8.0*  PHOS 3.6 2.6 4.1  --    Liver Function Tests:  Recent Labs Lab 06/12/16 0428  06/13/16 0416 06/14/16 0430 06/15/16 0400 06/16/16 0430  AST 48*  --  48*  --   --  28  ALT 44  --  47  --   --  22  ALKPHOS 149*  --  124  --   --  122  BILITOT 1.0  --  0.5  --   --  1.3*  PROT 5.2*  --  4.9*  --   --  5.2*  ALBUMIN 2.0*  < > 1.9* 2.0* 2.0* 2.0*  < > = values in this interval not displayed. No results for input(s): LIPASE, AMYLASE in the last 168 hours. No results for input(s): AMMONIA in the last 168 hours. CBC:  Recent Labs Lab 06/12/16 0428 06/13/16 0416 06/14/16 0430 06/15/16 0355 06/16/16 0430  WBC 12.4* 12.2* 12.9* 11.4* 10.4  HGB 10.7* 9.7*  10.1* 9.5* 10.3*  HCT 32.8* 29.7* 31.4* 29.7* 31.8*  MCV 89.6 88.9 90.2 90.3 91.1  PLT 266 269 245 289 340   Cardiac Enzymes: No results for input(s): CKTOTAL, CKMB, CKMBINDEX, TROPONINI in the last 168 hours. CBG:  Recent Labs Lab 06/15/16 0837 06/15/16 1538 06/15/16 2110 06/16/16 0429 06/16/16 0731  GLUCAP 93 104* 145* 182* 224*    Iron Studies: No results for input(s): IRON, TIBC, TRANSFERRIN, FERRITIN in the last 72 hours. Studies/Results: Dg Chest 2 View  Result Date: 06/16/2016 CLINICAL DATA:  Status post CABG EXAM: CHEST  2 VIEW COMPARISON:  Portable chest x-ray of June 13, 2016 FINDINGS: The lungs are well-expanded. The interstitial markings have improved. There remain small bilateral pleural effusions. Subsegmental atelectasis persists at both lung bases but has decreased in conspicuity. The heart is normal in size. There are post CABG changes. There is calcification in the wall of the aortic arch. There is no pulmonary vascular congestion. The dual-lumen dialysis catheter tip projects over the distal third of the SVC. The small caliber left internal jugular venous catheter tip projects at the junction of the right and left  brachiocephalic veins. The sternal wires are intact. The bony thorax elsewhere is unremarkable. IMPRESSION: Interval resolution of pulmonary interstitial edema. Persistent small bilateral pleural effusions. Persistent lower left lower lobe atelectasis or pneumonia. Aortic atherosclerosis. Electronically Signed   By: David  Martinique M.D.   On: 06/16/2016 07:18   Medications: Infusions: . sodium chloride 250 mL (06/12/16 0741)  . sodium chloride 10 mL/hr at 06/13/16 0240    Scheduled Medications: . aspirin EC  325 mg Oral Daily   Or  . aspirin  324 mg Per Tube Daily  . atorvastatin  80 mg Oral q1800  . bisacodyl  10 mg Oral Daily   Or  . bisacodyl  10 mg Rectal Daily  . carvedilol  6.25 mg Oral BID WC  . chlorhexidine  15 mL Mouth Rinse BID  . darbepoetin (ARANESP) injection - DIALYSIS  100 mcg Intravenous Q Sat-HD  . docusate sodium  200 mg Oral Daily  . feeding supplement (PRO-STAT SUGAR FREE 64)  30 mL Per Tube BID  . heparin  40 Units/kg Dialysis Once in dialysis  . heparin subcutaneous  5,000 Units Subcutaneous Q12H  . insulin aspart  0-24 Units Subcutaneous TID AC & HS  . insulin detemir  6 Units Subcutaneous BID  . isosorbide mononitrate  15 mg Oral Daily  . mouth rinse  15 mL Mouth Rinse q12n4p  . multivitamin  1 tablet Oral QHS  . pantoprazole  40 mg Oral Q1200  . sodium chloride flush  10-40 mL Intracatheter Q12H  . sodium chloride flush  3 mL Intravenous Q12H    have reviewed scheduled and prn medications.  Physical Exam: General: alert, NAD Heart: RRR Lungs: clear Abdomen: soft, non tender Extremities: no edema Dialysis Access: PC  On right    06/16/2016,9:06 AM  LOS: 16 days

## 2016-06-16 NOTE — Care Management Note (Addendum)
Case Management Note  Patient Details  Name: Kenneth [name] MRN: [id number] Date of Birth: [birth year]  Subjective/Objective:     Pt s/p CABG   - currently on CRRT               Action/Plan:  Pt is from home with wife.  Recommendations for CIR - CSW consulted for back up plan    Expected Discharge Date:                  Expected Discharge Plan:  [place]  In-House Referral:     Discharge planning Services     Post Acute Care Choice:    Choice offered to:     DME Arranged:    DME Agency:     HH Arranged:    [place] Agency:     Status of Service:  In process, will continue to follow  If discussed at Long Length of Stay Meetings, dates discussed:    Additional Comments: 06/16/2016  Discussed in LOS 06/16/16:  Pt remains appropriate for continued stay 3 Days Post-Op Procedure(s) (LRB): INSERTION OF DIALYSIS CATHETER RIGHT INTERNAL JUGULAR (Right) CXR small L effusion,  Needs A-pacing for sinus brady, transfer orders for stepdown written.  Last PT eval recommends HH - PT ordered to re-eval today -  CM will continue to follow for discharge needs  Maryclare Labrador, RN 06/16/2016, 8:58 AM

## 2016-06-17 ENCOUNTER — Inpatient Hospital Stay (HOSPITAL_COMMUNITY): Payer: BLUE CROSS/BLUE SHIELD

## 2016-06-17 LAB — COMPREHENSIVE METABOLIC PANEL
ALT: 21 U/L (ref 17–63)
AST: 24 U/L (ref 15–41)
Albumin: 2 g/dL — ABNORMAL LOW (ref 3.5–5.0)
Alkaline Phosphatase: 106 U/L (ref 38–126)
Anion gap: 9 (ref 5–15)
BUN: 29 mg/dL — ABNORMAL HIGH (ref 6–20)
CO2: 26 mmol/L (ref 22–32)
Calcium: 8.1 mg/dL — ABNORMAL LOW (ref 8.9–10.3)
Chloride: 97 mmol/L — ABNORMAL LOW (ref 101–111)
Creatinine, Ser: 3.57 mg/dL — ABNORMAL HIGH (ref 0.61–1.24)
GFR calc Af Amer: 20 mL/min — ABNORMAL LOW (ref >60)
GFR calc non Af Amer: 17 mL/min — ABNORMAL LOW (ref >60)
Glucose, Bld: 98 mg/dL (ref 65–99)
Potassium: 4.1 mmol/L (ref 3.5–5.1)
Sodium: 132 mmol/L — ABNORMAL LOW (ref 135–145)
Total Bilirubin: 0.9 mg/dL (ref 0.3–1.2)
Total Protein: 5.1 g/dL — ABNORMAL LOW (ref 6.5–8.1)

## 2016-06-17 LAB — GLUCOSE, CAPILLARY
Glucose-Capillary: 120 mg/dL — ABNORMAL HIGH (ref 65–99)
Glucose-Capillary: 265 mg/dL — ABNORMAL HIGH (ref 65–99)
Glucose-Capillary: 134 mg/dL — ABNORMAL HIGH (ref 65–99)
Glucose-Capillary: 212 mg/dL — ABNORMAL HIGH (ref 65–99)

## 2016-06-17 LAB — CBC
HEMATOCRIT: 31.3 % — AB (ref 39.0–52.0)
Hemoglobin: 10.5 g/dL — ABNORMAL LOW (ref 13.0–17.0)
MCH: 30.6 pg (ref 26.0–34.0)
MCHC: 33.5 g/dL (ref 30.0–36.0)
MCV: 91.3 fL (ref 78.0–100.0)
Platelets: 390 10*3/uL (ref 150–400)
RBC: 3.43 MIL/uL — ABNORMAL LOW (ref 4.22–5.81)
RDW: 15.2 % (ref 11.5–15.5)
WBC: 13.2 10*3/uL — AB (ref 4.0–10.5)

## 2016-06-17 NOTE — Progress Notes (Addendum)
      [address]       [place] [id number]             [phone]        4 Days Post-Op Procedure(s) (LRB): INSERTION OF DIALYSIS CATHETER RIGHT INTERNAL JUGULAR (Right)  Subjective: Patient eating breakfast-without complaints  Objective: Vital signs in last 24 hours: Temp:  [97.8 F (36.6 C)-98.3 F (36.8 C)] 98.1 F (36.7 C) (10/04 0353) Pulse Rate:  [49-74] 72 (10/04 0353) Cardiac Rhythm: Atrial paced (10/04 0727) Resp:  [5-23] 18 (10/04 0353) BP: (99-143)/(41-78) 113/53 (10/04 0353) SpO2:  [90 %-96 %] 95 % (10/04 0353) Weight:  [135 lb 12.9 oz (61.6 kg)] 135 lb 12.9 oz (61.6 kg) (10/04 0353)  Pre op weight 79 kg Current Weight  06/17/16 135 lb 12.9 oz (61.6 kg)       Intake/Output from previous day: 10/03 0701 - 10/04 0700 In: 20 [I.V.:20] Out: 250 [Urine:250]   Physical Exam:  Cardiovascular: A paced, RRR Pulmonary: Clear to auscultation bilaterally; no rales, wheezes, or rhonchi. Abdomen: Soft, non tender, bowel sounds present. Extremities: Mild bilateral lower extremity edema. Wounds: Clean and dry.  No erythema or signs of infection.  Lab Results: CBC: Recent Labs  06/16/16 0430 06/17/16 0418  WBC 10.4 13.2*  HGB 10.3* 10.5*  HCT 31.8* 31.3*  PLT 340 390   BMET:  Recent Labs  06/16/16 0430 06/17/16 0418  NA 134* 132*  K 4.4 4.1  CL 96* 97*  CO2 25 26  GLUCOSE 176* 98  BUN 23* 29*  CREATININE 2.87* 3.57*  CALCIUM 8.0* 8.1*    PT/INR:  Lab Results  Component Value Date   INR 1.10 06/12/2016   INR 1.47 06/06/2016   INR 0.96 05/31/2016   ABG:  INR: Will add last result for INR, ABG once components are confirmed Will add last 4 CBG results once components are confirmed  Assessment/Plan:  1. CV - A paced. On Coreg 6.25 mg bid, Imdur 15 mg daily 2.  Pulmonary - On room air. CXR this am shows no pneumothorax, bibasilar subsegmental atelectasis. Encourage incentive spirometer and flutter valve. 3. DM-CBGs  126/303/120. Pre op HGA1C 10.4. Will need close medical follow up after discharge. 4.  Acute blood loss anemia - H and H stable at 10.5 and 31.3. On Aranesp. 5. CKD-stage 4.Creatinine increased from 2.87 to 3.57. Had HD Monday. Nephrology following.   ZIMMERMAN,DONIELLE MPA-C 06/17/2016,7:50 AM  No urine output recorded today Now off amiodarone and maintaining sinus brady On low dose coreg for MI. LV dysfx- no ace-inhib due to acute on chronic renal failure CXR ok Cont current care  Einar Grad MD

## 2016-06-17 NOTE — Progress Notes (Signed)
Inpatient Diabetes Program Recommendations  AACE/ADA: New Consensus Statement on Inpatient Glycemic Control (2015)  Target Ranges:  Prepandial:   less than 140 mg/dL      Peak postprandial:   less than 180 mg/dL (1-2 hours)      Critically ill patients:  140 - 180 mg/dL   Lab Results  Component Value Date   GLUCAP 120 (H) 06/17/2016   HGBA1C 10.4 (H) 06/04/2016    Review of Glycemic Control Results for Kenneth [name], Kenneth [name] (MRN [id number]) as of 06/17/2016 10:01  Ref. Range 06/16/2016 07:31 06/16/2016 11:35 06/16/2016 18:20 06/16/2016 21:04 06/17/2016 06:22  Glucose-Capillary Latest Ref Range: 65 - 99 mg/dL 224 (H) 267 (H) 126 (H) 303 (H) 120 (H)   Inpatient Diabetes Program Recommendations:  Noted postprandial CBGs elevated.  Recommend adding Novolog 2 units meal coverage tid.  Thank you, Nani Gasser. Sarea Fyfe, RN, MSN, CDE Inpatient Glycemic Control Team Team Pager [phone] (8am-5pm) 06/17/2016 10:02 AM

## 2016-06-17 NOTE — Progress Notes (Signed)
Subjective:   Only 250 of UOP recorded yest- crt up some Objective Vital signs in last 24 hours: Vitals:   06/16/16 1620 06/16/16 2100 06/17/16 0353 06/17/16 0847  BP: (!) 143/78 (!) 106/41 (!) 113/53 (!) 112/52  Pulse: 72 74 72 73  Resp: 18 18 18 19   Temp:  98.3 F (36.8 C) 98.1 F (36.7 C) 99.1 F (37.3 C)  TempSrc:  Oral Oral Oral  SpO2: 94% 92% 95% 90%  Weight:   61.6 kg (135 lb 12.9 oz)   Height:       Weight change: -0.7 kg (-1 lb 8.7 oz)  Intake/Output Summary (Last 24 hours) at 06/17/16 1002 Last data filed at 06/16/16 1837  Gross per 24 hour  Intake               20 ml  Output                0 ml  Net               20 ml    Assessment/ Plan: Pt is [name] 60 y.o. yo male with HTN, DM and CKD 4 who was admitted on 05/31/2016 with NSTEMI required semiemergent CABG  Assessment/Plan: 1. Renal- CRRT started 9/23 after CABG- then transitioned to IHD- first tx 9/30- second 10/2- has PC- plans were being made for perm access but pt declines right now.  No uop previously but now 250 + ccs - preop creatinine in the high 3's to 4's.  Still suspect this will be ESRD given his level of dysfunction prior to major insults but not out of realm of possibility he could improve- will need to continue with discussions of plans for permanent HD/access- will start CLIP as well.  Will not do any HD today and check labs/UOP tomorrow before ordering routine HD.  Still says he is thinking about AVF/AVG 2. HTN/vol- BP good- low dose coreg only  3. Anemia- hgb trending down - on iron and aranesp 4. Secondary hyperparathyroidism- calc/phos are OK - PTH was 66- no meds 5. S/p CABG- seems to be progressing well- per CTS    Kenneth [name]    Labs: Basic Metabolic Panel:  Recent Labs Lab 06/13/16 0416 06/14/16 0430 06/15/16 0400 06/16/16 0430 06/17/16 0418  NA 136 138 134* 134* 132*  K 3.8 4.0 3.9 4.4 4.1  CL 105 104 101 96* 97*  CO2 25 25 25 25 26   GLUCOSE 154* 132* 101* 176* 98  BUN  38* 17 30* 23* 29*  CREATININE 3.22* 2.17* 3.48* 2.87* 3.57*  CALCIUM 8.0* 7.7* 8.0* 8.0* 8.1*  PHOS 3.6 2.6 4.1  --   --    Liver Function Tests:  Recent Labs Lab 06/13/16 0416  06/15/16 0400 06/16/16 0430 06/17/16 0418  AST 48*  --   --  28 24  ALT 47  --   --  22 21  ALKPHOS 124  --   --  122 106  BILITOT 0.5  --   --  1.3* 0.9  PROT 4.9*  --   --  5.2* 5.1*  ALBUMIN 1.9*  < > 2.0* 2.0* 2.0*  < > = values in this interval not displayed. No results for input(s): LIPASE, AMYLASE in the last 168 hours. No results for input(s): AMMONIA in the last 168 hours. CBC:  Recent Labs Lab 06/13/16 0416 06/14/16 0430 06/15/16 0355 06/16/16 0430 06/17/16 0418  WBC 12.2* 12.9* 11.4* 10.4 13.2*  HGB 9.7* 10.1* 9.5* 10.3*  10.5*  HCT 29.7* 31.4* 29.7* 31.8* 31.3*  MCV 88.9 90.2 90.3 91.1 91.3  PLT 269 245 289 340 390   Cardiac Enzymes: No results for input(s): CKTOTAL, CKMB, CKMBINDEX, TROPONINI in the last 168 hours. CBG:  Recent Labs Lab 06/16/16 0731 06/16/16 1135 06/16/16 1820 06/16/16 2104 06/17/16 0622  GLUCAP 224* 267* 126* 303* 120*    Iron Studies: No results for input(s): IRON, TIBC, TRANSFERRIN, FERRITIN in the last 72 hours. Studies/Results: Dg Chest 2 View  Result Date: 06/16/2016 CLINICAL DATA:  Status post CABG EXAM: CHEST  2 VIEW COMPARISON:  Portable chest x-ray of June 13, 2016 FINDINGS: The lungs are well-expanded. The interstitial markings have improved. There remain small bilateral pleural effusions. Subsegmental atelectasis persists at both lung bases but has decreased in conspicuity. The heart is normal in size. There are post CABG changes. There is calcification in the wall of the aortic arch. There is no pulmonary vascular congestion. The dual-lumen dialysis catheter tip projects over the distal third of the SVC. The small caliber left internal jugular venous catheter tip projects at the junction of the right and left brachiocephalic veins. The  sternal wires are intact. The bony thorax elsewhere is unremarkable. IMPRESSION: Interval resolution of pulmonary interstitial edema. Persistent small bilateral pleural effusions. Persistent lower left lower lobe atelectasis or pneumonia. Aortic atherosclerosis. Electronically Signed   By: David  Martinique M.D.   On: 06/16/2016 07:18   Dg Chest Port 1 View  Result Date: 06/17/2016 CLINICAL DATA:  Status post coronary artery bypass graft. EXAM: PORTABLE CHEST 1 VIEW COMPARISON:  Radiograph of June 16, 2016. FINDINGS: Stable cardiomediastinal silhouette. Status post coronary bypass graft. Stable positioning of right internal jugular dialysis catheter as well as left internal jugular venous catheter. No pneumothorax is noted. Stable bibasilar subsegmental atelectasis or edema with associated pleural effusions is noted. Bony thorax is unremarkable. IMPRESSION: Stable support apparatus. Stable bibasilar opacities as described above. Electronically Signed   By: Marijo Conception, M.D.   On: 06/17/2016 07:32   Medications: Infusions: . sodium chloride 250 mL (06/12/16 0741)  . sodium chloride 10 mL/hr at 06/13/16 6222    Scheduled Medications: . aspirin EC  325 mg Oral Daily   Or  . aspirin  324 mg Per Tube Daily  . atorvastatin  80 mg Oral q1800  . bisacodyl  10 mg Oral Daily   Or  . bisacodyl  10 mg Rectal Daily  . carvedilol  6.25 mg Oral BID WC  . chlorhexidine  15 mL Mouth Rinse BID  . darbepoetin (ARANESP) injection - DIALYSIS  100 mcg Intravenous Q Sat-HD  . docusate sodium  200 mg Oral Daily  . feeding supplement (PRO-STAT SUGAR FREE 64)  30 mL Per Tube BID  . heparin  40 Units/kg Dialysis Once in dialysis  . heparin subcutaneous  5,000 Units Subcutaneous Q12H  . insulin aspart  0-24 Units Subcutaneous TID AC & HS  . insulin detemir  6 Units Subcutaneous BID  . isosorbide mononitrate  15 mg Oral Daily  . mouth rinse  15 mL Mouth Rinse q12n4p  . multivitamin  1 tablet Oral QHS  .  pantoprazole  40 mg Oral Q1200  . sodium chloride flush  10-40 mL Intracatheter Q12H  . sodium chloride flush  10-40 mL Intracatheter Q12H  . sodium chloride flush  3 mL Intravenous Q12H    have reviewed scheduled and prn medications.  Physical Exam: General: alert, NAD Heart: RRR Lungs: clear Abdomen: soft, non  tender Extremities: no edema Dialysis Access: PC  on right    06/17/2016,10:02 AM  LOS: 17 days

## 2016-06-17 NOTE — Progress Notes (Signed)
CARDIAC REHAB PHASE I   PRE:  Rate/Rhythm: 72 Pacing  BP:  Sitting: 108/64       SaO2: 96 RA  MODE:  Ambulation: 210 ft   POST:  Rate/Rhythm: 72 Pacing  BP:  Sitting: 104/57       SaO2: 96 RA  Pt was sleeping but after talking to him he was able to get up to walk.  Pt seemed weak and tired.  Walked with RW 210 ft and had to stop three times to rest.  Pt stated he was feeling dizzy but completed walk back to his room.  Returned to chair with call bell and phone in reach.  Advised pt to walk again later today.  Will continue to follow.   [place], [place] 06/17/2016 2:30 PM

## 2016-06-18 LAB — COMPREHENSIVE METABOLIC PANEL
ALT: 18 U/L (ref 17–63)
AST: 24 U/L (ref 15–41)
Albumin: 2 g/dL — ABNORMAL LOW (ref 3.5–5.0)
Alkaline Phosphatase: 99 U/L (ref 38–126)
Anion gap: 12 (ref 5–15)
BUN: 33 mg/dL — ABNORMAL HIGH (ref 6–20)
CO2: 22 mmol/L (ref 22–32)
Calcium: 7.7 mg/dL — ABNORMAL LOW (ref 8.9–10.3)
Chloride: 99 mmol/L — ABNORMAL LOW (ref 101–111)
Creatinine, Ser: 3.72 mg/dL — ABNORMAL HIGH (ref 0.61–1.24)
GFR calc Af Amer: 19 mL/min — ABNORMAL LOW (ref >60)
GFR calc non Af Amer: 17 mL/min — ABNORMAL LOW (ref >60)
Glucose, Bld: 127 mg/dL — ABNORMAL HIGH (ref 65–99)
Potassium: 3.9 mmol/L (ref 3.5–5.1)
Sodium: 133 mmol/L — ABNORMAL LOW (ref 135–145)
Total Bilirubin: 0.9 mg/dL (ref 0.3–1.2)
Total Protein: 4.5 g/dL — ABNORMAL LOW (ref 6.5–8.1)

## 2016-06-18 LAB — GLUCOSE, CAPILLARY
Glucose-Capillary: 165 mg/dL — ABNORMAL HIGH (ref 65–99)
Glucose-Capillary: 172 mg/dL — ABNORMAL HIGH (ref 65–99)
Glucose-Capillary: 235 mg/dL — ABNORMAL HIGH (ref 65–99)
Glucose-Capillary: 215 mg/dL — ABNORMAL HIGH (ref 65–99)

## 2016-06-18 MED ORDER — CARVEDILOL 3.125 MG PO TABS
3.1250 mg | ORAL_TABLET | Freq: Two times a day (BID) | ORAL | Status: DC
  Administered 2016-06-18 – 2016-06-23 (×6): 3.125 mg via ORAL
  Filled 2016-06-18 (×7): qty 1

## 2016-06-18 MED ORDER — INSULIN DETEMIR 100 UNIT/ML ~~LOC~~ SOLN
10.0000 [IU] | Freq: Two times a day (BID) | SUBCUTANEOUS | Status: DC
  Administered 2016-06-18: 10 [IU] via SUBCUTANEOUS
  Filled 2016-06-18 (×2): qty 0.1

## 2016-06-18 NOTE — Progress Notes (Signed)
Subjective:   unrecorded UOP yest- crt up but only from 3.5 to 3.7 Objective Vital signs in last 24 hours: Vitals:   06/17/16 1015 06/17/16 1400 06/17/16 1950 06/18/16 0440  BP: (!) 112/52 (!) 104/57 117/62 125/61  Pulse: 70 71 75 72  Resp:   20 20  Temp: 99.1 F (37.3 C)  98.2 F (36.8 C) 98.5 F (36.9 C)  TempSrc: Oral  Oral Oral  SpO2: 94% 95% 95% 94%  Weight:    62 kg (136 lb 9.6 oz)  Height:       Weight change: 0.361 kg (12.8 oz)  Intake/Output Summary (Last 24 hours) at 06/18/16 0842 Last data filed at 06/18/16 0453  Gross per 24 hour  Intake          1648.21 ml  Output                0 ml  Net          1648.21 ml    Assessment/ Plan: Pt is a 60 y.o. yo male with HTN, DM and CKD 4 who was admitted on 05/31/2016 with NSTEMI required semiemergent CABG  Assessment/Plan: 1. Renal- CRRT started 9/23 after CABG- then transitioned to IHD- first tx 9/30- second 10/2- has PC- plans were being made for perm access but pt declines right now.  No uop previously but now 250 + ccs - preop creatinine in the high 3's to 4's.  Still suspect this will be ESRD given his level of dysfunction prior to major insults but not out of realm of possibility he could improve-  will start CLIP as well just in case  Creatinine only up slightly BUN in the 30's so must be getting some clearance.Will not do any HD today and check labs/UOP tomorrow before ordering. Will need to stay here for a little longer while we figure this out. Still says he is thinking about AVF/AVG 2. HTN/vol- BP good- low dose coreg only  3. Anemia- hgb trending down - on iron and aranesp- better 4. Secondary hyperparathyroidism- calc/phos are OK - PTH was 66- no meds 5. S/p CABG- seems to be progressing well- per CTS    Karsten Howry A    Labs: Basic Metabolic Panel:  Recent Labs Lab 06/13/16 0416 06/14/16 0430 06/15/16 0400 06/16/16 0430 06/17/16 0418 06/18/16 0439  NA 136 138 134* 134* 132* 133*  K 3.8 4.0 3.9  4.4 4.1 3.9  CL 105 104 101 96* 97* 99*  CO2 25 25 25 25 26 22   GLUCOSE 154* 132* 101* 176* 98 127*  BUN 38* 17 30* 23* 29* 33*  CREATININE 3.22* 2.17* 3.48* 2.87* 3.57* 3.72*  CALCIUM 8.0* 7.7* 8.0* 8.0* 8.1* 7.7*  PHOS 3.6 2.6 4.1  --   --   --    Liver Function Tests:  Recent Labs Lab 06/16/16 0430 06/17/16 0418 06/18/16 0439  AST 28 24 24   ALT 22 21 18   ALKPHOS 122 106 99  BILITOT 1.3* 0.9 0.9  PROT 5.2* 5.1* 4.5*  ALBUMIN 2.0* 2.0* 2.0*   No results for input(s): LIPASE, AMYLASE in the last 168 hours. No results for input(s): AMMONIA in the last 168 hours. CBC:  Recent Labs Lab 06/13/16 0416 06/14/16 0430 06/15/16 0355 06/16/16 0430 06/17/16 0418  WBC 12.2* 12.9* 11.4* 10.4 13.2*  HGB 9.7* 10.1* 9.5* 10.3* 10.5*  HCT 29.7* 31.4* 29.7* 31.8* 31.3*  MCV 88.9 90.2 90.3 91.1 91.3  PLT 269 245 289 340 390   Cardiac Enzymes:  No results for input(s): CKTOTAL, CKMB, CKMBINDEX, TROPONINI in the last 168 hours. CBG:  Recent Labs Lab 06/17/16 0622 06/17/16 1141 06/17/16 1611 06/17/16 2113 06/18/16 0635  GLUCAP 120* 265* 134* 212* 165*    Iron Studies: No results for input(s): IRON, TIBC, TRANSFERRIN, FERRITIN in the last 72 hours. Studies/Results: Dg Chest Port 1 View  Result Date: 06/17/2016 CLINICAL DATA:  Status post coronary artery bypass graft. EXAM: PORTABLE CHEST 1 VIEW COMPARISON:  Radiograph of June 16, 2016. FINDINGS: Stable cardiomediastinal silhouette. Status post coronary bypass graft. Stable positioning of right internal jugular dialysis catheter as well as left internal jugular venous catheter. No pneumothorax is noted. Stable bibasilar subsegmental atelectasis or edema with associated pleural effusions is noted. Bony thorax is unremarkable. IMPRESSION: Stable support apparatus. Stable bibasilar opacities as described above. Electronically Signed   By: Marijo Conception, M.D.   On: 06/17/2016 07:32   Medications: Infusions: . sodium chloride 250  mL (06/12/16 0741)  . sodium chloride 10 mL/hr at 06/13/16 6503    Scheduled Medications: . aspirin EC  325 mg Oral Daily   Or  . aspirin  324 mg Per Tube Daily  . atorvastatin  80 mg Oral q1800  . bisacodyl  10 mg Oral Daily   Or  . bisacodyl  10 mg Rectal Daily  . carvedilol  6.25 mg Oral BID WC  . chlorhexidine  15 mL Mouth Rinse BID  . darbepoetin (ARANESP) injection - DIALYSIS  100 mcg Intravenous Q Sat-HD  . docusate sodium  200 mg Oral Daily  . feeding supplement (PRO-STAT SUGAR FREE 64)  30 mL Per Tube BID  . heparin  40 Units/kg Dialysis Once in dialysis  . heparin subcutaneous  5,000 Units Subcutaneous Q12H  . insulin aspart  0-24 Units Subcutaneous TID AC & HS  . insulin detemir  6 Units Subcutaneous BID  . isosorbide mononitrate  15 mg Oral Daily  . mouth rinse  15 mL Mouth Rinse q12n4p  . multivitamin  1 tablet Oral QHS  . pantoprazole  40 mg Oral Q1200  . sodium chloride flush  10-40 mL Intracatheter Q12H  . sodium chloride flush  10-40 mL Intracatheter Q12H  . sodium chloride flush  3 mL Intravenous Q12H    have reviewed scheduled and prn medications.  Physical Exam: General: alert, NAD Heart: RRR Lungs: clear Abdomen: soft, non tender Extremities: no edema Dialysis Access: PC  on right    06/18/2016,8:42 AM  LOS: 18 days

## 2016-06-18 NOTE — Progress Notes (Signed)
CARDIAC REHAB PHASE I   PRE:  Rate/Rhythm: 72 pacing    BP: sitting 120/69    SaO2: 94 RA  MODE:  Ambulation: 440 ft   POST:  Rate/Rhythm: 77 pacing    BP: sitting 126/63     SaO2: 96 RA  Pt difficult to wake however able to walk with min assist once moving. Used RW, assist x2, gait belt. Several rest stops for dyspnea/fatigue. Much steadier today compared to yesterday. Able to follow commands easily, adhering to sternal precautions. To recliner with feet elevated. Asked for his apple to be cut up. Thankful for [place] walking with him. Will f/u tomorrow, can walk with nursing today.  [place], ACSM 06/18/2016 8:37 AM

## 2016-06-18 NOTE — Progress Notes (Signed)
Inpatient Diabetes Program Recommendations  AACE/ADA: New Consensus Statement on Inpatient Glycemic Control (2015)  Target Ranges:  Prepandial:   less than 140 mg/dL      Peak postprandial:   less than 180 mg/dL (1-2 hours)      Critically ill patients:  140 - 180 mg/dL   Lab Results  Component Value Date   GLUCAP 165 (H) 06/18/2016   HGBA1C 10.4 (H) 06/04/2016    Review of Glycemic Control  Diabetes history: DM1 Outpatient Diabetes medications: Insulin Pump (Total basal 21.6 units/24 hrs., meal coverage 1 units per 10-12 gms carbohydrates) Current orders for Inpatient glycemic control: Novolog 0-24 units TID, Levemir 6 units BID  Inpatient Diabetes Program Recommendations:   Glucose elevated at meal times, please consider starting Novolog 2 units TID meal coverage in addition to correction if patient consumes at least 50% of meals.  Thanks,  Tama Headings RN, MSN, [facility] Inpatient Diabetes Coordinator Team Pager [phone] (8a-5p)

## 2016-06-18 NOTE — Progress Notes (Signed)
Removed LIJ per MD order. Pt tolerated well. Catheter intact. Held pressure for 10 mins. Pt in bed and educated to remain in bed for 95mins. Bed alarm on. Well continue to monitor. Isac Caddy, RN

## 2016-06-18 NOTE — Progress Notes (Addendum)
      [address]       [place] [address]             [phone]        5 Days Post-Op Procedure(s) (LRB): INSERTION OF DIALYSIS CATHETER RIGHT INTERNAL JUGULAR (Right)  Subjective: Patient asleep this am.  Objective: Vital signs in last 24 hours: Temp:  [98.2 F (36.8 C)-99.1 F (37.3 C)] 98.5 F (36.9 C) (10/05 0440) Pulse Rate:  [70-75] 72 (10/05 0440) Cardiac Rhythm: Atrial paced (10/05 0716) Resp:  [19-20] 20 (10/05 0440) BP: (104-125)/(52-62) 125/61 (10/05 0440) SpO2:  [90 %-95 %] 94 % (10/05 0440) Weight:  [136 lb 9.6 oz (62 kg)] 136 lb 9.6 oz (62 kg) (10/05 0440)  Pre op weight 79 kg Current Weight  06/18/16 136 lb 9.6 oz (62 kg)      Intake/Output from previous day: 10/04 0701 - 10/05 0700 In: 1648.2 [P.O.:360; I.V.:1288.2] Out: -    Physical Exam:  Cardiovascular: A paced, RRR Pulmonary: Clear to auscultation bilaterally; no rales, wheezes, or rhonchi. Abdomen: Soft, non tender, bowel sounds present. Extremities: No  lower extremity edema. Wounds: Clean and dry.  No erythema or signs of infection.  Lab Results: CBC:  Recent Labs  06/16/16 0430 06/17/16 0418  WBC 10.4 13.2*  HGB 10.3* 10.5*  HCT 31.8* 31.3*  PLT 340 390   BMET:   Recent Labs  06/17/16 0418 06/18/16 0439  NA 132* 133*  K 4.1 3.9  CL 97* 99*  CO2 26 22  GLUCOSE 98 127*  BUN 29* 33*  CREATININE 3.57* 3.72*  CALCIUM 8.1* 7.7*    PT/INR:  Lab Results  Component Value Date   INR 1.10 06/12/2016   INR 1.47 06/06/2016   INR 0.96 05/31/2016   ABG:  INR: Will add last result for INR, ABG once components are confirmed Will add last 4 CBG results once components are confirmed  Assessment/Plan:  1. CV - A paced at 72. On Coreg 6.25 mg bid, Imdur 15 mg daily 2.  Pulmonary - On room air.  Encourage incentive spirometer and flutter valve. 3. DM-CBGs 134/212/165. Pre op HGA1C 10.4. Will need close medical follow up after discharge. 4.  Acute  blood loss anemia - H and H stable at 10.5 and 31.3. On Aranesp. 5. CKD-stage 4.Creatinine increased from 3.57 to 3.72. Nephrology following. 6. Continue with CRPI   ZIMMERMAN,DONIELLE MPA-C 06/18/2016,7:43 AM  Pacer off HR 55 with good BP- leave EPWs but disconnect pacer Remove L IJ and place PIV Low dose coreg for MI, LV dysfx, stop imdur Making some urine- need to observe renal fx in hospital levemir dose increased patient examined and medical record reviewed,agree with above note. Tharon Aquas Trigt III 06/18/2016

## 2016-06-18 NOTE — Progress Notes (Signed)
Speech Language Pathology Treatment: Dysphagia  Patient Details Name: Kenneth [name] MRN: [id number] DOB: 1955-10-15 Today's Date: 06/18/2016 Time: 2979-8921 SLP Time Calculation (min) (ACUTE ONLY): 15 min  Assessment / Plan / Recommendation Clinical Impression  F/u diet tolerance assessment complete. Patient impulsive with po intake regarding sip sizes and rate of intake, requiring moderate verbal and tactile cueing for slowed rate of intake and smaller bolus size. Immediate throat clear and/or cough post swallow noted in 75% of thin liquid trials via straw indicative of decreased airway protection, likely a sensory related deficit resulting in delayed swallow initiation s/p prolonged intubation. SLP removed straw and cued patient for small sips which eliminated overt s/s of aspiration. While silent aspiration a possibility given likelihood of sensory impairment, patient is now 9 days post extubation and without significant changes in temps, lung sounds, or CXR results to indicate significant aspiration event. Will continue current diet with precautions as swallowing function should only continue to improve. Will f/u for upgrade, education, and additional needs.    HPI HPI: Pt is 60 y.o.male with hx of poorly controlled diabetes, HTN, CKD IV, and HLD. Admitted 05/31/16 with orthopnea, DOE, and CP. EKG did not meet stemi criteria. Troponin 1.4. Echo showed global LV dysfunction. Started on NIMV for acute respiratory failure, failed requiring intubation on 9/17 and was extubated on 9/26.      SLP Plan  Continue with current plan of care     Recommendations  Diet recommendations: Regular;Thin liquid Liquids provided via: Cup;No straw Medication Administration: Whole meds with puree Supervision: Patient able to self feed;Intermittent supervision to cue for compensatory strategies Compensations: Slow rate;Small sips/bites Postural Changes and/or Swallow Maneuvers: Seated upright 90 degrees               Oral Care Recommendations: Oral care BID Follow up Recommendations: 24 hour supervision/assistance Plan: Continue with current plan of care       [place], [place] [phone]  Kenneth [name] Kenneth [name] 06/18/2016, 10:15 AM

## 2016-06-19 DIAGNOSIS — Z951 Presence of aortocoronary bypass graft: Secondary | ICD-10-CM

## 2016-06-19 LAB — COMPREHENSIVE METABOLIC PANEL
ALT: 21 U/L (ref 17–63)
AST: 26 U/L (ref 15–41)
Albumin: 2.2 g/dL — ABNORMAL LOW (ref 3.5–5.0)
Alkaline Phosphatase: 112 U/L (ref 38–126)
Anion gap: 10 (ref 5–15)
BUN: 39 mg/dL — ABNORMAL HIGH (ref 6–20)
CO2: 25 mmol/L (ref 22–32)
Calcium: 8.5 mg/dL — ABNORMAL LOW (ref 8.9–10.3)
Chloride: 96 mmol/L — ABNORMAL LOW (ref 101–111)
Creatinine, Ser: 4.43 mg/dL — ABNORMAL HIGH (ref 0.61–1.24)
GFR calc Af Amer: 16 mL/min — ABNORMAL LOW (ref >60)
GFR calc non Af Amer: 13 mL/min — ABNORMAL LOW (ref >60)
Glucose, Bld: 150 mg/dL — ABNORMAL HIGH (ref 65–99)
Potassium: 4.3 mmol/L (ref 3.5–5.1)
Sodium: 131 mmol/L — ABNORMAL LOW (ref 135–145)
Total Bilirubin: 0.5 mg/dL (ref 0.3–1.2)
Total Protein: 5.6 g/dL — ABNORMAL LOW (ref 6.5–8.1)

## 2016-06-19 LAB — CBC
HCT: 32.8 % — ABNORMAL LOW (ref 39.0–52.0)
Hemoglobin: 10.4 g/dL — ABNORMAL LOW (ref 13.0–17.0)
MCH: 29.4 pg (ref 26.0–34.0)
MCHC: 31.7 g/dL (ref 30.0–36.0)
MCV: 92.7 fL (ref 78.0–100.0)
Platelets: 466 10*3/uL — ABNORMAL HIGH (ref 150–400)
RBC: 3.54 MIL/uL — ABNORMAL LOW (ref 4.22–5.81)
RDW: 16.5 % — ABNORMAL HIGH (ref 11.5–15.5)
WBC: 12.3 10*3/uL — ABNORMAL HIGH (ref 4.0–10.5)

## 2016-06-19 LAB — GLUCOSE, CAPILLARY
Glucose-Capillary: 107 mg/dL — ABNORMAL HIGH (ref 65–99)
Glucose-Capillary: 123 mg/dL — ABNORMAL HIGH (ref 65–99)
Glucose-Capillary: 154 mg/dL — ABNORMAL HIGH (ref 65–99)
Glucose-Capillary: 196 mg/dL — ABNORMAL HIGH (ref 65–99)
Glucose-Capillary: 211 mg/dL — ABNORMAL HIGH (ref 65–99)

## 2016-06-19 MED ORDER — INSULIN ASPART 100 UNIT/ML ~~LOC~~ SOLN
0.0000 [IU] | Freq: Three times a day (TID) | SUBCUTANEOUS | Status: DC
  Administered 2016-06-19: 4 [IU] via SUBCUTANEOUS
  Administered 2016-06-20: 2 [IU] via SUBCUTANEOUS
  Administered 2016-06-20: 8 [IU] via SUBCUTANEOUS
  Administered 2016-06-20 – 2016-06-21 (×2): 4 [IU] via SUBCUTANEOUS
  Administered 2016-06-21: 8 [IU] via SUBCUTANEOUS
  Administered 2016-06-21: 4 [IU] via SUBCUTANEOUS
  Administered 2016-06-23: 8 [IU] via SUBCUTANEOUS
  Administered 2016-06-23: 2 [IU] via SUBCUTANEOUS

## 2016-06-19 MED ORDER — INSULIN DETEMIR 100 UNIT/ML ~~LOC~~ SOLN
10.0000 [IU] | Freq: Two times a day (BID) | SUBCUTANEOUS | Status: DC
  Administered 2016-06-19 – 2016-06-20 (×4): 10 [IU] via SUBCUTANEOUS
  Filled 2016-06-19 (×6): qty 0.1

## 2016-06-19 MED ORDER — CITALOPRAM HYDROBROMIDE 20 MG PO TABS
20.0000 mg | ORAL_TABLET | Freq: Every day | ORAL | Status: DC
  Administered 2016-06-19 – 2016-06-23 (×4): 20 mg via ORAL
  Filled 2016-06-19 (×4): qty 1

## 2016-06-19 MED ORDER — GLUCERNA SHAKE PO LIQD
237.0000 mL | Freq: Three times a day (TID) | ORAL | Status: DC
  Administered 2016-06-19 – 2016-06-20 (×2): 237 mL via ORAL

## 2016-06-19 MED ORDER — INSULIN DETEMIR 100 UNIT/ML ~~LOC~~ SOLN
14.0000 [IU] | Freq: Two times a day (BID) | SUBCUTANEOUS | Status: DC
  Filled 2016-06-19 (×2): qty 0.14

## 2016-06-19 NOTE — Progress Notes (Signed)
CARDIAC REHAB PHASE I   PRE:  Rate/Rhythm: 62 SR    BP: sitting 98/56    SaO2: 98 RA  MODE:  Ambulation: 75 ft   POST:  Rate/Rhythm: 67 SR    BP: sitting 110/56, recheck after walking to recliner 110/56     SaO2: 97 RA  Pt stood independently and walked 75 ft with RW. Doing well till suddenly c/o dizziness and wanted to sit. Stated he was still dizzy sitting and wanted to go back to room. BP stable once in room. Pt significantly depressed/anxious. Sts he is not wanting AVF or HD and that he does not care if he lives. Pt declined seeing chaplain. I encouraged him to talk with his pastor and family about these decisions. PA to start Celexa. Left pt in recliner. [id number]   [place], ACSM 06/19/2016 10:51 AM

## 2016-06-19 NOTE — Progress Notes (Addendum)
      [address]       [place] [address]             [phone]        6 Days Post-Op Procedure(s) (LRB): INSERTION OF DIALYSIS CATHETER RIGHT INTERNAL JUGULAR (Right)  Subjective: Patient asleep again this am. Nurse reports not eating much-apparently, family bringing in food from home as he does not like "American food" much.  Objective: Vital signs in last 24 hours: Temp:  [98.5 F (36.9 C)-98.6 F (37 C)] 98.5 F (36.9 C) (10/06 0455) Pulse Rate:  [55-72] 64 (10/06 0455) Cardiac Rhythm: Normal sinus rhythm (10/06 0728) Resp:  [20] 20 (10/06 0455) BP: (99-128)/(50-72) 120/59 (10/06 0455) SpO2:  [94 %-95 %] 94 % (10/06 0455) Weight:  [137 lb 8 oz (62.4 kg)] 137 lb 8 oz (62.4 kg) (10/06 0455)  Pre op weight 79 kg Current Weight  06/19/16 137 lb 8 oz (62.4 kg)      Intake/Output from previous day: 10/05 0701 - 10/06 0700 In: 240 [P.O.:240] Out: -    Physical Exam:  Cardiovascular: RRR Pulmonary: Clear to auscultation bilaterally Abdomen: Soft, non tender, bowel sounds present. Extremities: No  lower extremity edema. Wounds: Clean and dry.  No erythema or signs of infection.  Lab Results: CBC:  Recent Labs  06/17/16 0418 06/19/16 0247  WBC 13.2* 12.3*  HGB 10.5* 10.4*  HCT 31.3* 32.8*  PLT 390 466*   BMET:   Recent Labs  06/18/16 0439 06/19/16 0247  NA 133* 131*  K 3.9 4.3  CL 99* 96*  CO2 22 25  GLUCOSE 127* 150*  BUN 33* 39*  CREATININE 3.72* 4.43*  CALCIUM 7.7* 8.5*    PT/INR:  Lab Results  Component Value Date   INR 1.10 06/12/2016   INR 1.47 06/06/2016   INR 0.96 05/31/2016   ABG:  INR: Will add last result for INR, ABG once components are confirmed Will add last 4 CBG results once components are confirmed  Assessment/Plan:  1. CV - Previously A paced. Still with EPW but not on and HR on average in the low 60's.On Coreg 3.125 mg bid. 2.  Pulmonary - On room air.  Encourage incentive spirometer and  flutter valve. 3. DM-CBGs 235/215/123. On Insulin and was increased. Pre op HGA1C 10.4. Will need close medical follow up after discharge. 4.  Acute blood loss anemia - H and H stable at 10.4 and 32.8. On Aranesp. 5. CKD-stage 4.Creatinine increased from 3.72 to 4.43. Nephrology following. Will need long term access. 6. Continue with CRPI   ZIMMERMAN,DONIELLE MPA-C 06/19/2016,7:40 AM Pull EPW sat patient examined and medical record reviewed,agree with above note. Tharon Aquas Trigt III 06/19/2016

## 2016-06-19 NOTE — Progress Notes (Signed)
Nutrition Follow Up  DOCUMENTATION CODES:   Not applicable  INTERVENTION:    Glucerna Shake po TID, each supplement provides 220 kcal and 10 grams of protein  NUTRITION DIAGNOSIS:   Inadequate oral intake now related to poor appetite as evidenced by meal completion < 25%, ongoing  GOAL:   Patient will meet greater than or equal to 90% of their needs, progressing   MONITOR:   PO intake, Supplement acceptance, Labs, Weight trends, I & O's  ASSESSMENT:   60 year old man with a history of poorly controlled diabetes, hypertension and hyperlipidemia. Over the past few weeks, he had not been feeling well. He thought he got the flu. He was seen at an urgent care in [facility] and was treated for bronchitis. Over the past several days, he has felt worse. He has been unable to lie flat. He had chest discomfort when lying flat. Both shortness of breath and chest discomfort were better when sitting forward.  He also noted lower extremity edema.  Pt with 3 vessel CAD, s/p CABG 9/22 >> extubated 9/26. Pt refused CORTRAK small bore feeding tube placement 10/2 PM. PO intake remains poor at 10-30% per flowsheet records. Noted family is bringing him in some food. Would benefit from nutrition supplements >> will order.  CBG (last 3)   Recent Labs  06/18/16 2058 06/19/16 0622 06/19/16 1116  GLUCAP 215* 123* 196*    Diet Order:  Diet heart healthy/carb modified Room service appropriate? Yes; Fluid consistency: Thin  Skin:  Reviewed, no issues  Last BM:  10/4  Height:   Ht Readings from Last 1 Encounters:  05/31/16 5\' 6"  (1.676 m)    Weight:   Wt Readings from Last 1 Encounters:  06/19/16 137 lb 8 oz (62.4 kg)    Ideal Body Weight:  64.5 kg  BMI:  Body mass index is 22.19 kg/m.  Estimated Nutritional Needs:   Kcal:  1700-1900  Protein:  90-100 gm  Fluid:  per MD  EDUCATION NEEDS:   No education needs identified at this time  Arthur Holms, RD, LDN Pager  #: [phone] After-Hours Pager #: [phone]

## 2016-06-19 NOTE — Progress Notes (Signed)
OT Cancellation Note  Patient Details Name: Kenneth [name] MRN: [id number] DOB: 1955/12/27   Cancelled Treatment:    Reason Eval/Treat Not Completed: Patient at procedure or test/ unavailable.  Pt currently in HD.  [place], OTR/L [phone]   Lucille Passy M 06/19/2016, 2:54 PM

## 2016-06-19 NOTE — Progress Notes (Signed)
Subjective:   no UOP yest- crt up more today- tray sitting in front of him- not touched Objective Vital signs in last 24 hours: Vitals:   06/18/16 0855 06/18/16 1704 06/18/16 2055 06/19/16 0455  BP: 128/72 (!) 115/50 99/61 (!) 120/59  Pulse: 72 61 (!) 55 64  Resp:   20 20  Temp:   98.6 F (37 C) 98.5 F (36.9 C)  TempSrc:   Oral Oral  SpO2:   95% 94%  Weight:    62.4 kg (137 lb 8 oz)  Height:       Weight change: 0.408 kg (14.4 oz)  Intake/Output Summary (Last 24 hours) at 06/19/16 0858 Last data filed at 06/19/16 0300  Gross per 24 hour  Intake              240 ml  Output                0 ml  Net              240 ml    Assessment/ Plan: Pt is a 60 y.o. yo male with HTN, DM and CKD 4 who was admitted on 05/31/2016 with NSTEMI required semiemergent CABG  Assessment/Plan: 1. Renal- CRRT started 9/23 after CABG- then transitioned to IHD- first tx 9/30- second 10/2- has PC- I gave him a few days as he seemed to be making urine but he is not clearing on own and I am afraid will need to continue HD- preop creatinine in the high 3's to 4's.  Now will be ESRD - HD today.  Still says he is thinking about AVF/AVG but I told him basically that this will be a requirement for him before he leaves the hospital as this will be the best way for him to receive HD.  He said "OK" but also plans on discussing with his son who will be coming into town from [place] today.  I have called [facility] to revisit- hope he can get AVF/AVG early next week prior to discharge.  He has an OP spot MWF at [facility] and can start there next week   2. HTN/vol- BP good- low dose coreg only  3. Anemia- hgb trending down - on iron and aranesp- better 4. Secondary hyperparathyroidism- calc/phos are OK - PTH was 66- no meds 5. S/p CABG- seems to be progressing well- per CTS.  My perfect plan would be for HD Monday- access Mon or Tuesday then could be discharged to start at [facility] on Wednesday     Meggen Spaziani A    Labs: Basic  Metabolic Panel:  Recent Labs Lab 06/13/16 0416 06/14/16 0430 06/15/16 0400  06/17/16 0418 06/18/16 0439 06/19/16 0247  NA 136 138 134*  < > 132* 133* 131*  K 3.8 4.0 3.9  < > 4.1 3.9 4.3  CL 105 104 101  < > 97* 99* 96*  CO2 25 25 25   < > 26 22 25   GLUCOSE 154* 132* 101*  < > 98 127* 150*  BUN 38* 17 30*  < > 29* 33* 39*  CREATININE 3.22* 2.17* 3.48*  < > 3.57* 3.72* 4.43*  CALCIUM 8.0* 7.7* 8.0*  < > 8.1* 7.7* 8.5*  PHOS 3.6 2.6 4.1  --   --   --   --   < > = values in this interval not displayed. Liver Function Tests:  Recent Labs Lab 06/17/16 0418 06/18/16 0439 06/19/16 0247  AST 24 24 26   ALT 21 18  21  ALKPHOS 106 99 112  BILITOT 0.9 0.9 0.5  PROT 5.1* 4.5* 5.6*  ALBUMIN 2.0* 2.0* 2.2*   No results for input(s): LIPASE, AMYLASE in the last 168 hours. No results for input(s): AMMONIA in the last 168 hours. CBC:  Recent Labs Lab 06/14/16 0430 06/15/16 0355 06/16/16 0430 06/17/16 0418 06/19/16 0247  WBC 12.9* 11.4* 10.4 13.2* 12.3*  HGB 10.1* 9.5* 10.3* 10.5* 10.4*  HCT 31.4* 29.7* 31.8* 31.3* 32.8*  MCV 90.2 90.3 91.1 91.3 92.7  PLT 245 289 340 390 466*   Cardiac Enzymes: No results for input(s): CKTOTAL, CKMB, CKMBINDEX, TROPONINI in the last 168 hours. CBG:  Recent Labs Lab 06/18/16 0635 06/18/16 1135 06/18/16 1648 06/18/16 2058 06/19/16 0622  GLUCAP 165* 172* 235* 215* 123*    Iron Studies: No results for input(s): IRON, TIBC, TRANSFERRIN, FERRITIN in the last 72 hours. Studies/Results: No results found. Medications: Infusions:    Scheduled Medications: . aspirin EC  325 mg Oral Daily   Or  . aspirin  324 mg Per Tube Daily  . atorvastatin  80 mg Oral q1800  . bisacodyl  10 mg Oral Daily   Or  . bisacodyl  10 mg Rectal Daily  . carvedilol  3.125 mg Oral BID WC  . chlorhexidine  15 mL Mouth Rinse BID  . darbepoetin (ARANESP) injection - DIALYSIS  100 mcg Intravenous Q Sat-HD  . docusate sodium  200 mg Oral Daily  . feeding  supplement (PRO-STAT SUGAR FREE 64)  30 mL Per Tube BID  . heparin  40 Units/kg Dialysis Once in dialysis  . heparin subcutaneous  5,000 Units Subcutaneous Q12H  . insulin aspart  0-24 Units Subcutaneous TID AC & HS  . insulin detemir  14 Units Subcutaneous BID  . mouth rinse  15 mL Mouth Rinse q12n4p  . multivitamin  1 tablet Oral QHS  . pantoprazole  40 mg Oral Q1200  . sodium chloride flush  10-40 mL Intracatheter Q12H  . sodium chloride flush  10-40 mL Intracatheter Q12H    have reviewed scheduled and prn medications.  Physical Exam: General: alert, NAD Heart: RRR Lungs: clear Abdomen: soft, non tender Extremities: no edema Dialysis Access: PC  on right    06/19/2016,8:58 AM  LOS: 19 days

## 2016-06-19 NOTE — Progress Notes (Signed)
Patient with low affect and depression about health issues. Will start Celexa.

## 2016-06-19 NOTE — Progress Notes (Signed)
PT Cancellation Note  Patient Details Name: Kenneth [name] MRN: [id number] DOB: [birth year]   Cancelled Treatment:    Reason Eval/Treat Not Completed: Patient at procedure or test/unavailable (Pt currently in HD and unavailable)   Melford Aase 06/19/2016, 1:43 PM Elwyn Reach, [place]

## 2016-06-19 NOTE — Discharge Summary (Signed)
Physician Discharge Summary  Patient ID: Kenneth [name] MRN: [id number] DOB/AGE: [birth year] 60 y.o.  Admit date: 05/31/2016 Discharge date: 06/23/2016  Admission Diagnoses:  Patient Active Problem List   Diagnosis Date Noted  . CHF (congestive heart failure) ([place])   . AKI (acute kidney injury) ([place])   . HCAP (healthcare-associated pneumonia)   . S/P thoracentesis   . Pleural effusion   . Acute pulmonary edema ([place]) 05/31/2016  . Acute respiratory failure with hypoxemia ([place]) 05/31/2016  . NSTEMI (non-ST elevated myocardial infarction) ([place])   . Shortness of breath   . Pain in the chest   . 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[place]) 02/06/2015  . Pain in lower limb 02/06/2015  . Ischemic rest pain of lower extremity ([place]) 02/06/2015   Discharge Diagnoses:   Patient Active Problem List   Diagnosis Date Noted  . 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 CHF (congestive heart failure) ([place])   . AKI (acute kidney injury) ([place])   . HCAP (healthcare-associated pneumonia)   . S/P thoracentesis   . Pleural effusion   . Acute pulmonary edema ([place]) 05/31/2016  . Acute respiratory failure with hypoxemia ([place]) 05/31/2016  . NSTEMI (non-ST elevated myocardial infarction) ([place])   . Shortness of breath   . Pain in the chest   . 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[place]) 02/06/2015  . Pain in lower limb 02/06/2015  . Ischemic rest pain of lower extremity ([place]) 02/06/2015   Discharged Condition: good  History of Present Illness:  Kenneth [name] is a 60 yo Asian Man with known history of poorly controlled diabetes, CKD Stg IV hypertension, and hyperlipidemia.  Over the past few weeks the patient has not been feeling well.  He thought he had the flu.  He did not get better and was evaluated by an Urgent Care in [facility] and was treated for Bronchitis.  Despite treatment the patient continued to feel poorly.  He has been unable to lie flat and this is accompanied by chest pain.  The pain is relieved when he sits forward.  He was also noted to have lower extremity edema.  He presented to Med Center in [facility].  Workup there was concerning for STEMI, but after review by Cardiology if was felt to not be the case.  His Troponin was elevated and he had poor oxygenation saturations and required intubation.  He was referred to [facility] for further care.   Hospital Course:    While at [facility] the patient was becoming hypotensive at times and there was concern the patient may be suffering from Cardiogenic shock.  Emergent ECHO was done and showed global LV dysfunction.  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  An intra-aortic balloon pump was placed.  It was felt coronary bypass would be indicated and TCTS consult was obtained.  He was evaluated by Dr. Prescott [name] who was in agreement the patient would require bypass surgery.  However, he would need to be medically stabilized from a pulmonary standpoint prior to proceeding.  He remained on a ventilator prior to surgery.  He was aggressively diuresed.  He underwent Thoracentesis on the left side.  He was treated with broad spectrum antibiotics for Serratia in sputum.  He developed anemia and required blood transfusion.  His condition improved and he was felt medically stable for surgery 06/05/2016.  He was taken to the operating room and underwent CABG x 3 utilizing LIMA to LAD, SVG to OM, SVG to RCA.  He also underwent endoscopic harvest of greater saphenous vein from left leg.  He also underwent insertion of Trialysis Dialysis Catheter.  He tolerated the procedure was taken to the SICU in stable condition.  He was followed closely by Nephrology, AHF and Critical Care.  He was given IV Lasix but did not respond much.   He was started on CCRT on POD #1.  He was weaned off Milrinone and Amiodarone drip as tolerated.  His IABP was removed on POD #2.  While attempting to wean the vent  the patient developed rapid A. Fib with RVR.   This was exacerbated by agitation.  He responded well to Precedex.  He converted to NSR on IV Amiodarone.  He had a feeding tube placed via nares and was started on tube feeds POD #2.  The patients condition continued to improve.  He tolerated volume removal via CCRT and he was calm enough to be weaned and extubated on POD #4.  He was treated with IV Rocephin for GNR from sputum.  He continued to have several episodes of Atrial Fibrillation despite being on IV Amiodarone, and he was treated with IV Bolus.  After extubation SLP evaluation on POD #5 was obtained and he was found to be moderate aspiration risk.  He was kept NPO.  [name] condition was slow to improve.  Repeat SLP evaluation felt patient could advance to dysphagia diet.  He was tolerating dialysis and vascular surgery was consulted to place a tunneled dialysis catheter.  This was done on 06/13/2016 and the patient tolerated without difficulty.  The patient was unsteady with ambulation.  PT evaluation was obtained and they recommended CIR placement.  Nephrology requested placement of permanent dialysis catheter.  However, patient refused this stating he did not want any more surgery.  He continued to progress slowly.  He had repeat swallow evaluation and his diet was advanced per instructions.  However the patient continued to receive tube feeds for poor oral intake as the patient does not care for American food.  He was maintaining sinus bradycardia and was weaned off pacer as tolerated.  He was ambulating with assistance and was transferred to stepdown unit for further care.  He continued to make slow progress.  He was weaned off Amiodarone as his bradycardia persisted.  He was tolerating dialysis without difficulty.  He was started on  Celexa for depression.  He was agreeable to proceed with permanent dialysis catheter placement.  This was done by Dr. Bridgett Larsson on 06/22/2016.  He continues to remain stable.  He is NSR and his pacing wires have been removed.  He has uncontrolled diabetes and will require close follow up after discharge.  He is ambulating with assistance and home health arrangements have been made.  He is tolerating a diet of food that his wife is bringing in from home.  Outpatient dialysis has been arranged at [facility] for MWF second shift.   He is felt medically stable for discharge home today.                      Consults: nephrology and vascular surgery  Significant Diagnostic Studies: angiography:    LM lesion, 40 %stenosed.  Ramus lesion, 80 %stenosed.  Ost Cx to Prox Cx lesion, 80 %  stenosed.  Mid LAD lesion, 90 %stenosed.  Ost 2nd Diag to 2nd Diag lesion, 90 %stenosed.  Prox RCA to Mid RCA lesion, 100 %stenosed.  LV end diastolic pressure is moderately elevated.  Hemodynamic findings consistent with mild pulmonary hypertension.  IABP placed.   Acute systolic heart failure from ischemic 3 vessel CAD  Treatments: surgery:   1. Coronary artery bypass grafting x3 (left internal mammary artery to     LAD, saphenous vein graft to right coronary artery, saphenous vein     graft to obtuse marginal). 2. Endoscopic harvest of left leg greater saphenous vein. 3. Placement of percutaneous hemodialysis catheter via right     subclavian vein.  PROCEDURE: 1. Right internal jugular vein tunneled dialysis catheter placement 2. Right internal jugular vein cannulation under ultrasound guidance 3. Removal of right subclavian vein temporary dialysis catheter  Disposition: Final discharge disposition not confirmed   Discharge medications:  The patient has been discharged on:   1.Beta Blocker:  Yes [x   ]                              No   [   ]                              If No, reason:  2.Ace  Inhibitor/ARB: Yes [   ]                                     No  [  x  ]                                     If No, reason: Renal Failure  3.Statin:   Yes [  x ]                  No  [   ]                  If No, reason:  4.Ecasa:  Yes  [ x  ]                  No   [   ]                  If No, reason:        Medication List    STOP taking these medications   doxycycline 100 MG tablet Commonly known as:  VIBRA-TABS   gabapentin 300 MG capsule Commonly known as:  NEURONTIN   ibuprofen 200 MG tablet Commonly known as:  ADVIL,MOTRIN   lidocaine 5 % Commonly known as:  LIDODERM   losartan-hydrochlorothiazide 50-12.5 MG tablet Commonly known as:  HYZAAR     TAKE these medications   albuterol 108 (90 Base) MCG/ACT inhaler Commonly known as:  PROVENTIL HFA;VENTOLIN HFA Inhale 2 puffs into the lungs every 6 (six) hours as needed for wheezing.   aspirin 325 MG EC tablet Take 1 tablet (325 mg total) by mouth daily.   atorvastatin 80 MG tablet Commonly known as:  LIPITOR Take 1 tablet (80 mg total) by mouth daily at 6 PM. What changed:  medication strength  how much to take  when to take this  carvedilol 3.125 MG tablet Commonly known as:  COREG Take 1 tablet (3.125 mg total) by mouth daily with breakfast.   citalopram 20 MG tablet Commonly known as:  CELEXA Take 1 tablet (20 mg total) by mouth daily.   Darbepoetin Alfa 100 MCG/0.5ML Sosy injection Commonly known as:  ARANESP Inject 0.5 mLs (100 mcg total) into the vein every Saturday with hemodialysis. Start taking on:  06/27/2016   insulin pump Soln Inject into the skin continuous. Novolog   multivitamin with minerals Tabs tablet Take 1 tablet by mouth at bedtime.   oxyCODONE 5 MG immediate release tablet Commonly known as:  Oxy IR/ROXICODONE Take 1 tablet (5 mg total) by mouth every 6 (six) hours as needed for severe pain.      Follow-up Information    Tharon Aquas Trigt III, MD Follow up on  07/15/2016.   Specialty:  Cardiothoracic Surgery Why:  Appointment is at 1:30, please get CXR at 1:00 at [facility] located on first floor of our office building Contact information: [place] [address] [phone]        Charlie Pitter, PA-C Follow up on 07/08/2016.   Specialties:  Cardiology, Radiology Why:  Appointment is at 9:00 Contact information: [address] [place] [address] [phone]        Curt Jews, MD Follow up in 4 week(s).   Specialties:  Vascular Surgery, Cardiology Why:  Our office will call you to arrange an appointment (sent) Contact information: [place] [address] [phone]        home health .   Why:  Will need f/u with home health agency for continued care at home.        [facility] .   Why:  HHRN arranged- they will call you to set up home visits Contact information: [place] [address] [phone]        Rodena Medin, MD .   Specialty:  Internal Medicine Why:  Will need follow-up especially for depression. Started Celexa while inpatient.  Contact information: [address] [place] [address] [phone]           Signed: Elgie Collard 06/23/2016, 3:09 PM

## 2016-06-19 NOTE — Progress Notes (Signed)
Inpatient Diabetes Program Recommendations  AACE/ADA: New Consensus Statement on Inpatient Glycemic Control (2015)  Target Ranges:  Prepandial:   less than 140 mg/dL      Peak postprandial:   less than 180 mg/dL (1-2 hours)      Critically ill patients:  140 - 180 mg/dL   Lab Results  Component Value Date   GLUCAP 123 (H) 06/19/2016   HGBA1C 10.4 (H) 06/04/2016    Review of Glycemic Control:  Results for SHIHEEM, CORPORAN (MRN [id number]) as of 06/19/2016 09:55  Ref. Range 06/18/2016 06:35 06/18/2016 11:35 06/18/2016 16:48 06/18/2016 20:58 06/19/2016 06:22  Glucose-Capillary Latest Ref Range: 65 - 99 mg/dL 165 (H) 172 (H) 235 (H) 215 (H) 123 (H)   Diabetes history: Type 1 diabetes- Insulin pump Outpatient Diabetes medications: Insulin pump Current orders for Inpatient glycemic control:  Levemir 14 units bid, Novolog TCTS tid with meals and HS Inpatient Diabetes Program Recommendations:    Called and discussed with PA, Andreas Blower. Orders received to change Levemir to 10 units bid and d/c HS coverage.  Will follow.  Thanks, Adah Perl, RN, BC-ADM Inpatient Diabetes Coordinator Pager [phone] (8a-5p)

## 2016-06-20 LAB — GLUCOSE, CAPILLARY
Glucose-Capillary: 138 mg/dL — ABNORMAL HIGH (ref 65–99)
Glucose-Capillary: 141 mg/dL — ABNORMAL HIGH (ref 65–99)
Glucose-Capillary: 183 mg/dL — ABNORMAL HIGH (ref 65–99)
Glucose-Capillary: 222 mg/dL — ABNORMAL HIGH (ref 65–99)
Glucose-Capillary: 247 mg/dL — ABNORMAL HIGH (ref 65–99)
Glucose-Capillary: 229 mg/dL — ABNORMAL HIGH (ref 65–99)

## 2016-06-20 LAB — COMPREHENSIVE METABOLIC PANEL
ALT: 25 U/L (ref 17–63)
AST: 32 U/L (ref 15–41)
Albumin: 2.4 g/dL — ABNORMAL LOW (ref 3.5–5.0)
Alkaline Phosphatase: 115 U/L (ref 38–126)
Anion gap: 10 (ref 5–15)
BUN: 17 mg/dL (ref 6–20)
CO2: 23 mmol/L (ref 22–32)
Calcium: 8.3 mg/dL — ABNORMAL LOW (ref 8.9–10.3)
Chloride: 101 mmol/L (ref 101–111)
Creatinine, Ser: 3.16 mg/dL — ABNORMAL HIGH (ref 0.61–1.24)
GFR calc Af Amer: 23 mL/min — ABNORMAL LOW (ref >60)
GFR calc non Af Amer: 20 mL/min — ABNORMAL LOW (ref >60)
Glucose, Bld: 156 mg/dL — ABNORMAL HIGH (ref 65–99)
Potassium: 4 mmol/L (ref 3.5–5.1)
Sodium: 134 mmol/L — ABNORMAL LOW (ref 135–145)
Total Bilirubin: 0.5 mg/dL (ref 0.3–1.2)
Total Protein: 5.7 g/dL — ABNORMAL LOW (ref 6.5–8.1)

## 2016-06-20 MED ORDER — MUPIROCIN CALCIUM 2 % EX CREA
TOPICAL_CREAM | Freq: Two times a day (BID) | CUTANEOUS | Status: DC | PRN
  Filled 2016-06-20: qty 15

## 2016-06-20 MED ORDER — CEFUROXIME SODIUM 1.5 G IJ SOLR
1.5000 g | INTRAMUSCULAR | Status: AC
  Administered 2016-06-22: 1.5 g via INTRAVENOUS
  Filled 2016-06-20: qty 1.5

## 2016-06-20 NOTE — Consult Note (Signed)
Spoke with pt and son regarding placement of left arm AV fistula.  Pt is agreeable at this time.  Will plan left brachial cephalic AVF as per Dr Lianne Moris prior consult note.  This is scheduled for Dr Donnetta Hutching on Monday 06/22/16  [name], MD [facility] Office: [phone] Pager: [phone]

## 2016-06-20 NOTE — Progress Notes (Signed)
Pt is hypotensive and symptomatic(disoriented). Instructed not to ambulate per RN request.   Rande Lawman RCEP

## 2016-06-20 NOTE — Progress Notes (Addendum)
      [address]       [place] [address]             [phone]      7 Days Post-Op Procedure(s) (LRB): INSERTION OF DIALYSIS CATHETER RIGHT INTERNAL JUGULAR (Right) Subjective: Resting comfortably. No issues overnight.   Objective: Vital signs in last 24 hours: Temp:  [97.9 F (36.6 C)-98.4 F (36.9 C)] 98.4 F (36.9 C) (10/07 0419) Pulse Rate:  [57-69] 66 (10/07 0419) Cardiac Rhythm: Normal sinus rhythm (10/07 0810) Resp:  [16-20] 20 (10/07 0419) BP: (92-130)/(51-76) 126/56 (10/07 0419) SpO2:  [94 %-96 %] 94 % (10/07 0419) Weight:  [130 lb 6.4 oz (59.1 kg)-140 lb 10.5 oz (63.8 kg)] 130 lb 6.4 oz (59.1 kg) (10/07 0419)     Intake/Output from previous day: 10/06 0701 - 10/07 0700 In: -  Out: 2200 [Urine:200] Intake/Output this shift: No intake/output data recorded.  General appearance: alert and no distress Heart: regular rate and rhythm, S1, S2 normal, no murmur, click, rub or gallop Lungs: clear to auscultation bilaterally Abdomen: soft, non-tender; bowel sounds normal; no masses,  no organomegaly Extremities: extremities normal, atraumatic, no cyanosis or edema Wound: c/d/i without drainage  Lab Results:  Recent Labs  06/19/16 0247  WBC 12.3*  HGB 10.4*  HCT 32.8*  PLT 466*   BMET:  Recent Labs  06/19/16 0247 06/20/16 0306  NA 131* 134*  K 4.3 4.0  CL 96* 101  CO2 25 23  GLUCOSE 150* 156*  BUN 39* 17  CREATININE 4.43* 3.16*  CALCIUM 8.5* 8.3*    PT/INR: No results for input(s): LABPROT, INR in the last 72 hours. ABG    Component Value Date/Time   PHART 7.439 06/09/2016 1247   HCO3 26.3 06/09/2016 1247   TCO2 27 06/09/2016 1247   ACIDBASEDEF 3.0 (H) 06/09/2016 1118   O2SAT 70.0 06/16/2016 0437   CBG (last 3)   Recent Labs  06/19/16 2355 06/20/16 0415 06/20/16 0809  GLUCAP 211* 138* 141*    Assessment/Plan: S/P Procedure(s) (LRB): INSERTION OF DIALYSIS CATHETER RIGHT INTERNAL JUGULAR (Right)  S/P CABG X 3  on 9/22  1. CV -NSR 70s. One episode of HR < 60. Will discontinue EPW. On Coreg 3.125 mg bid-toelrating 2.  Pulmonary - On room air.  Encourage incentive spirometer and flutter valve. 3. DM-CBGs 211/138/141. On Insulin. Pre op HGA1C 10.4. Will need close medical follow up after discharge. 4.  Acute blood loss anemia - H and H stable. On Aranesp. 5. CKD-stage 4.Creatinine 3.16.  Nephrology following. Will need long term access. 6. Continue with CRPI  Plan: Continue medical care. Discontinue epicardial pacing wires. Ambulate TID. Encourge Is/flutter valve use.    LOS: 20 days    Elgie Collard 06/20/2016   For fistula soon. I have seen and examined [name] and agree with the above assessment  and plan.  [name] MD [facility] [phone] Office [phone] 06/20/2016 12:53 PM

## 2016-06-20 NOTE — Progress Notes (Signed)
Notified attending of Pt BP and pulse.  Will not pull EPW at this time and cont to monitor.

## 2016-06-20 NOTE — Progress Notes (Signed)
Subjective:   Per RN patient is now agreeable to placement of AVF/ AVG.  He apparently didn't understand but after talking w his son who is in from [place], he understands better and is ok with proceeding.    Objective Vital signs in last 24 hours: Vitals:   06/19/16 1718 06/19/16 2033 06/20/16 0419 06/20/16 1214  BP: (!) 106/58 (!) 92/51 (!) 126/56 (!) 85/40  Pulse: 65 (!) 57 66 (!) 59  Resp: 17 18 20    Temp: 98.1 F (36.7 C) 97.9 F (36.6 C) 98.4 F (36.9 C)   TempSrc: Oral Oral Oral   SpO2: 96% 95% 94%   Weight: 59.6 kg (131 lb 6.3 oz)  59.1 kg (130 lb 6.4 oz)   Height:       Weight change: 1.431 kg (3 lb 2.5 oz)  Intake/Output Summary (Last 24 hours) at 06/20/16 1313 Last data filed at 06/19/16 1950  Gross per 24 hour  Intake                0 ml  Output             2200 ml  Net            -2200 ml    Assessment/ Plan: Pt is a 60 y.o. yo male with HTN, DM and CKD 4 who was admitted on 05/31/2016 with NSTEMI required semiemergent CABG   Assessment/Plan: 1. Renal- CRRT started 9/23 after CABG- then transitioned to IHD- first tx 9/30.  Not recovering function, now likely ESRD.  Has TDC and CLIP's to [facility], MWF.  Needs perm access prior to dc, patient is now agreeable after talking with his son who came in from [place].  Will notify [facility] pateint is agreeable to access procedure.   2. HTN/vol- BP good- low dose coreg only  3. Anemia- hgb trending down - on iron and aranesp- better 4. Secondary hyperparathyroidism- calc/phos are OK - PTH was 66- no meds 5. S/p CABG- seems to be progressing well- per CTS.  My perfect plan would be for HD Monday- access Mon or Tuesday then could be discharged to start at AF on Wednesday     Rob Lauria Depoy MD [facility] pager [phone]   06/20/2016, 1:18 PM   Labs: Basic Metabolic Panel:  Recent Labs Lab 06/14/16 0430 06/15/16 0400  06/18/16 0439 06/19/16 0247 06/20/16 0306  NA 138 134*  < > 133* 131* 134*  K 4.0 3.9  < >  3.9 4.3 4.0  CL 104 101  < > 99* 96* 101  CO2 25 25  < > 22 25 23   GLUCOSE 132* 101*  < > 127* 150* 156*  BUN 17 30*  < > 33* 39* 17  CREATININE 2.17* 3.48*  < > 3.72* 4.43* 3.16*  CALCIUM 7.7* 8.0*  < > 7.7* 8.5* 8.3*  PHOS 2.6 4.1  --   --   --   --   < > = values in this interval not displayed. Liver Function Tests:  Recent Labs Lab 06/18/16 0439 06/19/16 0247 06/20/16 0306  AST 24 26 32  ALT 18 21 25   ALKPHOS 99 112 115  BILITOT 0.9 0.5 0.5  PROT 4.5* 5.6* 5.7*  ALBUMIN 2.0* 2.2* 2.4*   No results for input(s): LIPASE, AMYLASE in the last 168 hours. No results for input(s): AMMONIA in the last 168 hours. CBC:  Recent Labs Lab 06/14/16 0430 06/15/16 0355 06/16/16 0430 06/17/16 0418 06/19/16 0247  WBC 12.9*  11.4* 10.4 13.2* 12.3*  HGB 10.1* 9.5* 10.3* 10.5* 10.4*  HCT 31.4* 29.7* 31.8* 31.3* 32.8*  MCV 90.2 90.3 91.1 91.3 92.7  PLT 245 289 340 390 466*   Cardiac Enzymes: No results for input(s): CKTOTAL, CKMB, CKMBINDEX, TROPONINI in the last 168 hours. CBG:  Recent Labs Lab 06/19/16 2025 06/19/16 2355 06/20/16 0415 06/20/16 0809 06/20/16 1123  GLUCAP 154* 211* 138* 141* 183*    Iron Studies: No results for input(s): IRON, TIBC, TRANSFERRIN, FERRITIN in the last 72 hours. Studies/Results: No results found. Medications: Infusions:    Scheduled Medications: . aspirin EC  325 mg Oral Daily   Or  . aspirin  324 mg Per Tube Daily  . atorvastatin  80 mg Oral q1800  . bisacodyl  10 mg Oral Daily   Or  . bisacodyl  10 mg Rectal Daily  . carvedilol  3.125 mg Oral BID WC  . [START ON 06/22/2016] cefUROXime (ZINACEF)  IV  1.5 g Intravenous To SS-Surg  . chlorhexidine  15 mL Mouth Rinse BID  . citalopram  20 mg Oral Daily  . darbepoetin (ARANESP) injection - DIALYSIS  100 mcg Intravenous Q Sat-HD  . docusate sodium  200 mg Oral Daily  . feeding supplement (GLUCERNA SHAKE)  237 mL Oral TID BM  . heparin subcutaneous  5,000 Units Subcutaneous Q12H  .  insulin aspart  0-24 Units Subcutaneous TID WC  . insulin detemir  10 Units Subcutaneous BID  . mouth rinse  15 mL Mouth Rinse q12n4p  . multivitamin  1 tablet Oral QHS  . pantoprazole  40 mg Oral Q1200  . sodium chloride flush  10-40 mL Intracatheter Q12H  . sodium chloride flush  10-40 mL Intracatheter Q12H    have reviewed scheduled and prn medications.  Physical Exam: General: alert, NAD Heart: RRR Lungs: clear Abdomen: soft, non tender Extremities: no edema Dialysis Access: PC  on right    06/20/2016,1:13 PM  LOS: 20 days

## 2016-06-20 NOTE — Progress Notes (Signed)
Patient ID: Kenneth [name], male   DOB: [birth year], 60 y.o.   MRN: [id number]  O:BP (!) 126/56 (BP Location: Right Arm)   Pulse 66   Temp 98.4 F (36.9 C) (Oral)   Resp 20   Ht 5\' 6"  (1.676 m)   Wt 59.1 kg (130 lb 6.4 oz)   SpO2 94%   BMI 21.05 kg/m   Intake/Output Summary (Last 24 hours) at 06/20/16 0849 Last data filed at 06/19/16 1950  Gross per 24 hour  Intake                0 ml  Output             2200 ml  Net            -2200 ml   Intake/Output: I/O last 3 completed shifts: In: 240 [P.O.:240] Out: 2200 [Urine:200; Other:2000]  Intake/Output this shift:  No intake/output data recorded. Weight change: 1.431 kg (3 lb 2.5 oz)    Recent Labs Lab 06/14/16 0430 06/15/16 0400 06/16/16 0430 06/17/16 0418 06/18/16 0439 06/19/16 0247 06/20/16 0306  NA 138 134* 134* 132* 133* 131* 134*  K 4.0 3.9 4.4 4.1 3.9 4.3 4.0  CL 104 101 96* 97* 99* 96* 101  CO2 25 25 25 26 22 25 23   GLUCOSE 132* 101* 176* 98 127* 150* 156*  BUN 17 30* 23* 29* 33* 39* 17  CREATININE 2.17* 3.48* 2.87* 3.57* 3.72* 4.43* 3.16*  ALBUMIN 2.0* 2.0* 2.0* 2.0* 2.0* 2.2* 2.4*  CALCIUM 7.7* 8.0* 8.0* 8.1* 7.7* 8.5* 8.3*  PHOS 2.6 4.1  --   --   --   --   --   AST  --   --  28 24 24 26  32  ALT  --   --  22 21 18 21 25    Liver Function Tests:  Recent Labs Lab 06/18/16 0439 06/19/16 0247 06/20/16 0306  AST 24 26 32  ALT 18 21 25   ALKPHOS 99 112 115  BILITOT 0.9 0.5 0.5  PROT 4.5* 5.6* 5.7*  ALBUMIN 2.0* 2.2* 2.4*   No results for input(s): LIPASE, AMYLASE in the last 168 hours. No results for input(s): AMMONIA in the last 168 hours. CBC:  Recent Labs Lab 06/14/16 0430 06/15/16 0355 06/16/16 0430 06/17/16 0418 06/19/16 0247  WBC 12.9* 11.4* 10.4 13.2* 12.3*  HGB 10.1* 9.5* 10.3* 10.5* 10.4*  HCT 31.4* 29.7* 31.8* 31.3* 32.8*  MCV 90.2 90.3 91.1 91.3 92.7  PLT 245 289 340 390 466*   Cardiac Enzymes: No results for input(s): CKTOTAL, CKMB, CKMBINDEX, TROPONINI in the last 168  hours. CBG:  Recent Labs Lab 06/19/16 1759 06/19/16 2025 06/19/16 2355 06/20/16 0415 06/20/16 0809  GLUCAP 107* 154* 211* 138* 141*    Iron Studies: No results for input(s): IRON, TIBC, TRANSFERRIN, FERRITIN in the last 72 hours. Studies/Results: No results found. Marland Kitchen aspirin EC  325 mg Oral Daily   Or  . aspirin  324 mg Per Tube Daily  . atorvastatin  80 mg Oral q1800  . bisacodyl  10 mg Oral Daily   Or  . bisacodyl  10 mg Rectal Daily  . carvedilol  3.125 mg Oral BID WC  . chlorhexidine  15 mL Mouth Rinse BID  . citalopram  20 mg Oral Daily  . darbepoetin (ARANESP) injection - DIALYSIS  100 mcg Intravenous Q Sat-HD  . docusate sodium  200 mg Oral Daily  . feeding supplement (GLUCERNA SHAKE)  237 mL Oral TID  BM  . heparin subcutaneous  5,000 Units Subcutaneous Q12H  . insulin aspart  0-24 Units Subcutaneous TID WC  . insulin detemir  10 Units Subcutaneous BID  . mouth rinse  15 mL Mouth Rinse q12n4p  . multivitamin  1 tablet Oral QHS  . pantoprazole  40 mg Oral Q1200  . sodium chloride flush  10-40 mL Intracatheter Q12H  . sodium chloride flush  10-40 mL Intracatheter Q12H    BMET    Component Value Date/Time   NA 134 (L) 06/20/2016 0306   K 4.0 06/20/2016 0306   CL 101 06/20/2016 0306   CO2 23 06/20/2016 0306   GLUCOSE 156 (H) 06/20/2016 0306   BUN 17 06/20/2016 0306   CREATININE 3.16 (H) 06/20/2016 0306   CALCIUM 8.3 (L) 06/20/2016 0306   GFRNONAA 20 (L) 06/20/2016 0306   GFRAA 23 (L) 06/20/2016 0306   CBC    Component Value Date/Time   WBC 12.3 (H) 06/19/2016 0247   RBC 3.54 (L) 06/19/2016 0247   HGB 10.4 (L) 06/19/2016 0247   HCT 32.8 (L) 06/19/2016 0247   PLT 466 (H) 06/19/2016 0247   MCV 92.7 06/19/2016 0247   MCH 29.4 06/19/2016 0247   MCHC 31.7 06/19/2016 0247   RDW 16.5 (H) 06/19/2016 0247   Assessment/Plan:  1. 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  He has been dialysis dependent since 06/06/16 when CRRT was  initiated following surgery.  He has remained oliguric and HD-dependent.  He is set up for [facility] on MWF schedule. 2. CAD s/p CABG- per CTS 3. Vascular access- will need AVF/AVG prior to discharge next week 4. Anemia- on ESA and iron.   5. SHPTH- not on meds 6. HTN/Vol- stable  Broadus John A Tzivia Oneil

## 2016-06-21 LAB — GLUCOSE, CAPILLARY
Glucose-Capillary: 194 mg/dL — ABNORMAL HIGH (ref 65–99)
Glucose-Capillary: 169 mg/dL — ABNORMAL HIGH (ref 65–99)
Glucose-Capillary: 208 mg/dL — ABNORMAL HIGH (ref 65–99)
Glucose-Capillary: 162 mg/dL — ABNORMAL HIGH (ref 65–99)

## 2016-06-21 MED ORDER — INSULIN DETEMIR 100 UNIT/ML ~~LOC~~ SOLN
13.0000 [IU] | Freq: Two times a day (BID) | SUBCUTANEOUS | Status: DC
  Administered 2016-06-21 – 2016-06-23 (×4): 13 [IU] via SUBCUTANEOUS
  Filled 2016-06-21 (×6): qty 0.13

## 2016-06-21 NOTE — Progress Notes (Signed)
Subjective:   No c/o   Objective Vital signs in last 24 hours: Vitals:   06/21/16 1145 06/21/16 1200 06/21/16 1215 06/21/16 1230  BP: 136/66 130/68 127/71 133/74  Pulse: 64 62 64 63  Resp:      Temp:      TempSrc:      SpO2:      Weight:      Height:       Weight change: -3.789 kg (-8 lb 5.7 oz)  Intake/Output Summary (Last 24 hours) at 06/21/16 1256 Last data filed at 06/20/16 1700  Gross per 24 hour  Intake              340 ml  Output                0 ml  Net              340 ml    Assessment/ Plan: Pt is a 60 y.o. yo male with HTN, DM and CKD 4 who was admitted on 05/31/2016 with NSTEMI required semiemergent CABG   Assessment: 1. Renal- CRRT started 9/23 after CABG- then transitioned to IHD- first tx 9/30. Baseline CKD IV, now not recovering function and likely ESRD.  Has TDC and CLIP'd to [facility], MWF.  For perm access surg tomorrow.  2. HTN - BP good- low dose coreg only  3. Anemia- hgb trending down - on iron and aranesp- better 4. Secondary hyperparathyroidism- calc/phos are OK - PTH was 66- no meds 5. S/p CABG- seems to be progressing well- per CTS.  6. Volume - euvolemic on exam  Plan - perm access Monday, HD Monday as well.  Prob dc Tuesday.   Kelly Splinter MD [facility] pager [phone]   06/21/2016, 12:56 PM   Labs: Basic Metabolic Panel:  Recent Labs Lab 06/15/16 0400  06/18/16 0439 06/19/16 0247 06/20/16 0306  NA 134*  < > 133* 131* 134*  K 3.9  < > 3.9 4.3 4.0  CL 101  < > 99* 96* 101  CO2 25  < > 22 25 23   GLUCOSE 101*  < > 127* 150* 156*  BUN 30*  < > 33* 39* 17  CREATININE 3.48*  < > 3.72* 4.43* 3.16*  CALCIUM 8.0*  < > 7.7* 8.5* 8.3*  PHOS 4.1  --   --   --   --   < > = values in this interval not displayed. Liver Function Tests:  Recent Labs Lab 06/18/16 0439 06/19/16 0247 06/20/16 0306  AST 24 26 32  ALT 18 21 25   ALKPHOS 99 112 115  BILITOT 0.9 0.5 0.5  PROT 4.5* 5.6* 5.7*  ALBUMIN 2.0* 2.2* 2.4*   No  results for input(s): LIPASE, AMYLASE in the last 168 hours. No results for input(s): AMMONIA in the last 168 hours. CBC:  Recent Labs Lab 06/15/16 0355 06/16/16 0430 06/17/16 0418 06/19/16 0247  WBC 11.4* 10.4 13.2* 12.3*  HGB 9.5* 10.3* 10.5* 10.4*  HCT 29.7* 31.8* 31.3* 32.8*  MCV 90.3 91.1 91.3 92.7  PLT 289 340 390 466*   Cardiac Enzymes: No results for input(s): CKTOTAL, CKMB, CKMBINDEX, TROPONINI in the last 168 hours. CBG:  Recent Labs Lab 06/20/16 1558 06/20/16 1626 06/20/16 2103 06/21/16 0640 06/21/16 1138  GLUCAP 247* 222* 229* 194* 169*    Iron Studies: No results for input(s): IRON, TIBC, TRANSFERRIN, FERRITIN in the last 72 hours. Studies/Results: No results found. Medications: Infusions:    Scheduled Medications: .  aspirin EC  325 mg Oral Daily   Or  . aspirin  324 mg Per Tube Daily  . atorvastatin  80 mg Oral q1800  . bisacodyl  10 mg Oral Daily   Or  . bisacodyl  10 mg Rectal Daily  . carvedilol  3.125 mg Oral BID WC  . [START ON 06/22/2016] cefUROXime (ZINACEF)  IV  1.5 g Intravenous To SS-Surg  . chlorhexidine  15 mL Mouth Rinse BID  . citalopram  20 mg Oral Daily  . darbepoetin (ARANESP) injection - DIALYSIS  100 mcg Intravenous Q Sat-HD  . docusate sodium  200 mg Oral Daily  . feeding supplement (GLUCERNA SHAKE)  237 mL Oral TID BM  . heparin subcutaneous  5,000 Units Subcutaneous Q12H  . insulin aspart  0-24 Units Subcutaneous TID WC  . insulin detemir  13 Units Subcutaneous BID  . mouth rinse  15 mL Mouth Rinse q12n4p  . multivitamin  1 tablet Oral QHS  . pantoprazole  40 mg Oral Q1200  . sodium chloride flush  10-40 mL Intracatheter Q12H  . sodium chloride flush  10-40 mL Intracatheter Q12H    have reviewed scheduled and prn medications.  Physical Exam: General: alert, NAD Heart: RRR Lungs: clear Abdomen: soft, non tender Extremities: no edema Dialysis Access: PC  on right    06/21/2016,12:56 PM  LOS: 21 days

## 2016-06-21 NOTE — Progress Notes (Signed)
Consult received for CIR backup plan-SNF.  Chart reviewed.  Pt to return home with [facility] noted.  CSW signing off.  Please re-consult as necessary.  Creta Levin, LCSW Weekend Coverage [phone]

## 2016-06-21 NOTE — Progress Notes (Addendum)
      [address]       [place] [address]             [phone]      8 Days Post-Op Procedure(s) (LRB): INSERTION OF DIALYSIS CATHETER RIGHT INTERNAL JUGULAR (Right) Subjective: Feels good this morning. Resting peacefully.   Objective: Vital signs in last 24 hours: Temp:  [98.1 F (36.7 C)-98.3 F (36.8 C)] 98.3 F (36.8 C) (10/08 0512) Pulse Rate:  [59-70] 70 (10/08 0512) Cardiac Rhythm: Normal sinus rhythm (10/08 0753) Resp:  [16-18] 18 (10/08 0512) BP: (85-134)/(40-75) 134/75 (10/08 0512) SpO2:  [92 %-94 %] 92 % (10/08 0512) Weight:  [132 lb 4.8 oz (60 kg)] 132 lb 4.8 oz (60 kg) (10/08 0512)     Intake/Output from previous day: 10/07 0701 - 10/08 0700 In: 440 [P.O.:440] Out: -  Intake/Output this shift: No intake/output data recorded.  General appearance: alert, cooperative and no distress Heart: regular rate and rhythm, S1, S2 normal, no murmur, click, rub or gallop Lungs: clear to auscultation bilaterally Abdomen: soft, non-tender; bowel sounds normal; no masses,  no organomegaly Extremities: extremities normal, atraumatic, no cyanosis or edema Wound: c/d/i without drainage  Lab Results:  Recent Labs  06/19/16 0247  WBC 12.3*  HGB 10.4*  HCT 32.8*  PLT 466*   BMET:  Recent Labs  06/19/16 0247 06/20/16 0306  NA 131* 134*  K 4.3 4.0  CL 96* 101  CO2 25 23  GLUCOSE 150* 156*  BUN 39* 17  CREATININE 4.43* 3.16*  CALCIUM 8.5* 8.3*    PT/INR: No results for input(s): LABPROT, INR in the last 72 hours. ABG    Component Value Date/Time   PHART 7.439 06/09/2016 1247   HCO3 26.3 06/09/2016 1247   TCO2 27 06/09/2016 1247   ACIDBASEDEF 3.0 (H) 06/09/2016 1118   O2SAT 70.0 06/16/2016 0437   CBG (last 3)   Recent Labs  06/20/16 1626 06/20/16 2103 06/21/16 0640  GLUCAP 222* 229* 194*    Assessment/Plan: S/P Procedure(s) (LRB): INSERTION OF DIALYSIS CATHETER RIGHT INTERNAL JUGULAR (Right) S/P CABG X 3 on 9/22  1. CV  -NSR 70s. EPW held yesterday due to hypotension. BP improved today. Discontinue EPW. On Coreg 3.125mg  bid-tolerating, holding parameters in place. 2. Pulmonary - On room air. Encourage incentive spirometer and flutter valve. 3. DM-CBGs 222/229/194. On Insulin. Will increase Levemir. Pre op HGA1C 10.4. Will need close medical follow up after discharge. 4. Acute blood loss anemia - H and H stable. On Aranesp. 5. CKD-stage 4. LastCreatinine 3.16.  Nephrology following. Will need long term access-planned for mon/tues per nephro 6. Continue with CRPI  Plan: Continue medical care. Discontinue EPW, BP improved. Ambulate TID. Encourge Is/flutter valve use. Plan to gain access tomorrow and discharge Wed per renal. Increase Levemir for improve blood glucose control.    LOS: 21 days    Elgie Collard 06/21/2016  For fistula in am poss home Tuesday  I have seen and examined [name] and agree with the above assessment  and plan.  [name] MD [facility] [phone] Office [phone] 06/21/2016 10:46 AM

## 2016-06-21 NOTE — Progress Notes (Signed)
Bed rest complete.  VS stable.  Pt denies discomfort.  Will cont to monitor.

## 2016-06-21 NOTE — Progress Notes (Signed)
Pt continues to be noncompliant with heart healthy or carb modified diet.  Only food Pt willing to eat is food brought in by family and friends.  Pt continues to refuse to get out of bed.  Pt slept this shift until mother came around 2 pm, this RN asked if she could help encourage him to get out of of bed.  Pt was willing to sit in chair for an hour after that.  Pt does agree that he is feeling overall better than yesterday.  This RN gave handout to family in [place] that discusses living healthy with diabetes.  Pt took handout and handed it to his family member.  This RN unsure if Pt is willing to accept teaching and apply to continued recovery.  Will cont to monitor and encourage Pt.

## 2016-06-21 NOTE — Progress Notes (Deleted)
Subjective:   No c/o   Objective Vital signs in last 24 hours: Vitals:   06/21/16 1145 06/21/16 1200 06/21/16 1215 06/21/16 1230  BP: 136/66 130/68 127/71 133/74  Pulse: 64 62 64 63  Resp:      Temp:      TempSrc:      SpO2:      Weight:      Height:       Weight change: -3.789 kg (-8 lb 5.7 oz)  Intake/Output Summary (Last 24 hours) at 06/21/16 1251 Last data filed at 06/20/16 1700  Gross per 24 hour  Intake              340 ml  Output                0 ml  Net              340 ml    Assessment/ Plan: Pt is a 60 y.o. yo male with HTN, DM and CKD 4 who was admitted on 05/31/2016 with NSTEMI required semiemergent CABG   Assessment: 1. Renal- CRRT started 9/23 after CABG- then transitioned to IHD- first tx 9/30. Baseline CKD IV, now not recovering function and likely ESRD.  Has TDC and CLIP'd to [facility], MWF.  For perm access surg tomorrow.  2. HTN - BP good- low dose coreg only  3. Anemia- hgb trending down - on iron and aranesp- better 4. Secondary hyperparathyroidism- calc/phos are OK - PTH was 66- no meds 5. S/p CABG- seems to be progressing well- per CTS.  6. Volume - euvolemic on exam  Plan - perm access Monday, HD Monday as well.  Prob dc Tuesday.   Kelly Splinter MD [facility] pager [phone]   06/21/2016, 12:51 PM   Labs: Basic Metabolic Panel:  Recent Labs Lab 06/15/16 0400  06/18/16 0439 06/19/16 0247 06/20/16 0306  NA 134*  < > 133* 131* 134*  K 3.9  < > 3.9 4.3 4.0  CL 101  < > 99* 96* 101  CO2 25  < > 22 25 23   GLUCOSE 101*  < > 127* 150* 156*  BUN 30*  < > 33* 39* 17  CREATININE 3.48*  < > 3.72* 4.43* 3.16*  CALCIUM 8.0*  < > 7.7* 8.5* 8.3*  PHOS 4.1  --   --   --   --   < > = values in this interval not displayed. Liver Function Tests:  Recent Labs Lab 06/18/16 0439 06/19/16 0247 06/20/16 0306  AST 24 26 32  ALT 18 21 25   ALKPHOS 99 112 115  BILITOT 0.9 0.5 0.5  PROT 4.5* 5.6* 5.7*  ALBUMIN 2.0* 2.2* 2.4*   No  results for input(s): LIPASE, AMYLASE in the last 168 hours. No results for input(s): AMMONIA in the last 168 hours. CBC:  Recent Labs Lab 06/15/16 0355 06/16/16 0430 06/17/16 0418 06/19/16 0247  WBC 11.4* 10.4 13.2* 12.3*  HGB 9.5* 10.3* 10.5* 10.4*  HCT 29.7* 31.8* 31.3* 32.8*  MCV 90.3 91.1 91.3 92.7  PLT 289 340 390 466*   Cardiac Enzymes: No results for input(s): CKTOTAL, CKMB, CKMBINDEX, TROPONINI in the last 168 hours. CBG:  Recent Labs Lab 06/20/16 1558 06/20/16 1626 06/20/16 2103 06/21/16 0640 06/21/16 1138  GLUCAP 247* 222* 229* 194* 169*    Iron Studies: No results for input(s): IRON, TIBC, TRANSFERRIN, FERRITIN in the last 72 hours. Studies/Results: No results found. Medications: Infusions:    Scheduled Medications: .  aspirin EC  325 mg Oral Daily   Or  . aspirin  324 mg Per Tube Daily  . atorvastatin  80 mg Oral q1800  . bisacodyl  10 mg Oral Daily   Or  . bisacodyl  10 mg Rectal Daily  . carvedilol  3.125 mg Oral BID WC  . [START ON 06/22/2016] cefUROXime (ZINACEF)  IV  1.5 g Intravenous To SS-Surg  . chlorhexidine  15 mL Mouth Rinse BID  . citalopram  20 mg Oral Daily  . darbepoetin (ARANESP) injection - DIALYSIS  100 mcg Intravenous Q Sat-HD  . docusate sodium  200 mg Oral Daily  . feeding supplement (GLUCERNA SHAKE)  237 mL Oral TID BM  . heparin subcutaneous  5,000 Units Subcutaneous Q12H  . insulin aspart  0-24 Units Subcutaneous TID WC  . insulin detemir  13 Units Subcutaneous BID  . mouth rinse  15 mL Mouth Rinse q12n4p  . multivitamin  1 tablet Oral QHS  . pantoprazole  40 mg Oral Q1200  . sodium chloride flush  10-40 mL Intracatheter Q12H  . sodium chloride flush  10-40 mL Intracatheter Q12H    have reviewed scheduled and prn medications.  Physical Exam: General: alert, NAD Heart: RRR Lungs: clear Abdomen: soft, non tender Extremities: no edema Dialysis Access: PC  on right    06/21/2016,12:51 PM  LOS: 21 days

## 2016-06-21 NOTE — Progress Notes (Signed)
EPW removed.  Wires intact.  Pt tol well.  Provided education yesterday through son interpretation and reeducated today on importance of bed rest x 1 hour.  Also educated that BP/pulse would be recorded every 15 minutes.  Pt indicates understanding.  Pt tol well.  Will cont to monitor.

## 2016-06-22 ENCOUNTER — Inpatient Hospital Stay (HOSPITAL_COMMUNITY): Payer: BLUE CROSS/BLUE SHIELD | Admitting: Critical Care Medicine

## 2016-06-22 ENCOUNTER — Encounter (HOSPITAL_COMMUNITY): Payer: Self-pay | Admitting: Critical Care Medicine

## 2016-06-22 ENCOUNTER — Encounter (HOSPITAL_COMMUNITY)
Admission: EM | Disposition: A | Discharge status: Home Health | Payer: Self-pay | Source: Home / Self Care | Attending: Internal Medicine

## 2016-06-22 ENCOUNTER — Other Ambulatory Visit: Payer: Self-pay

## 2016-06-22 DIAGNOSIS — Z48812 Encounter for surgical aftercare following surgery on the circulatory system: Secondary | ICD-10-CM

## 2016-06-22 DIAGNOSIS — Z992 Dependence on renal dialysis: Principal | ICD-10-CM

## 2016-06-22 DIAGNOSIS — N185 Chronic kidney disease, stage 5: Secondary | ICD-10-CM

## 2016-06-22 DIAGNOSIS — N186 End stage renal disease: Secondary | ICD-10-CM

## 2016-06-22 HISTORY — PX: AV FISTULA PLACEMENT: SHX1204

## 2016-06-22 LAB — BASIC METABOLIC PANEL WITH GFR
Anion gap: 9 (ref 5–15)
BUN: 25 mg/dL — ABNORMAL HIGH (ref 6–20)
CO2: 26 mmol/L (ref 22–32)
Calcium: 8.3 mg/dL — ABNORMAL LOW (ref 8.9–10.3)
Chloride: 98 mmol/L — ABNORMAL LOW (ref 101–111)
Creatinine, Ser: 4.08 mg/dL — ABNORMAL HIGH (ref 0.61–1.24)
GFR calc Af Amer: 17 mL/min — ABNORMAL LOW (ref >60)
GFR calc non Af Amer: 15 mL/min — ABNORMAL LOW (ref >60)
Glucose, Bld: 132 mg/dL — ABNORMAL HIGH (ref 65–99)
Potassium: 4.1 mmol/L (ref 3.5–5.1)
Sodium: 133 mmol/L — ABNORMAL LOW (ref 135–145)

## 2016-06-22 LAB — GLUCOSE, CAPILLARY
Glucose-Capillary: 91 mg/dL (ref 65–99)
Glucose-Capillary: 115 mg/dL — ABNORMAL HIGH (ref 65–99)
Glucose-Capillary: 120 mg/dL — ABNORMAL HIGH (ref 65–99)
Glucose-Capillary: 133 mg/dL — ABNORMAL HIGH (ref 65–99)
Glucose-Capillary: 213 mg/dL — ABNORMAL HIGH (ref 65–99)

## 2016-06-22 LAB — CBC
HCT: 31.7 % — ABNORMAL LOW (ref 39.0–52.0)
Hemoglobin: 10.5 g/dL — ABNORMAL LOW (ref 13.0–17.0)
MCH: 30.2 pg (ref 26.0–34.0)
MCHC: 33.1 g/dL (ref 30.0–36.0)
MCV: 91.1 fL (ref 78.0–100.0)
Platelets: 273 K/uL (ref 150–400)
RBC: 3.48 MIL/uL — ABNORMAL LOW (ref 4.22–5.81)
RDW: 16.6 % — ABNORMAL HIGH (ref 11.5–15.5)
WBC: 8.7 K/uL (ref 4.0–10.5)

## 2016-06-22 SURGERY — ARTERIOVENOUS (AV) FISTULA CREATION
Anesthesia: Monitor Anesthesia Care | Site: Arm Upper | Laterality: Left

## 2016-06-22 MED ORDER — FENTANYL CITRATE (PF) 100 MCG/2ML IJ SOLN: 25.0000 ug | INTRAMUSCULAR | Status: DC | PRN

## 2016-06-22 MED ORDER — ONDANSETRON HCL 4 MG/2ML IJ SOLN: 4.0000 mg | Freq: Once | INTRAMUSCULAR | Status: DC | PRN

## 2016-06-22 MED ORDER — ALTEPLASE 2 MG IJ SOLR: 2.0000 mg | Freq: Once | INTRAMUSCULAR | Status: DC | PRN

## 2016-06-22 MED ORDER — HEPARIN SODIUM (PORCINE) 1000 UNIT/ML DIALYSIS: 2000.0000 [IU] | INTRAMUSCULAR | Status: DC | PRN

## 2016-06-22 MED ORDER — FENTANYL CITRATE (PF) 100 MCG/2ML IJ SOLN
INTRAMUSCULAR | Status: AC
  Filled 2016-06-22: qty 2

## 2016-06-22 MED ORDER — LIDOCAINE HCL (PF) 1 % IJ SOLN: 5.0000 mL | INTRAMUSCULAR | Status: DC | PRN

## 2016-06-22 MED ORDER — SODIUM CHLORIDE 0.9 % IV SOLN: 100.0000 mL | INTRAVENOUS | Status: DC | PRN

## 2016-06-22 MED ORDER — MIDAZOLAM HCL 5 MG/5ML IJ SOLN
INTRAMUSCULAR | Status: DC | PRN
  Administered 2016-06-22: 1 mg via INTRAVENOUS

## 2016-06-22 MED ORDER — OXYCODONE HCL 5 MG PO TABS
ORAL_TABLET | ORAL | Status: AC
  Administered 2016-06-22: 10 mg via ORAL
  Filled 2016-06-22: qty 2

## 2016-06-22 MED ORDER — SODIUM CHLORIDE 0.9 % IV SOLN
INTRAVENOUS | Status: DC
  Administered 2016-06-22: 13:00:00 via INTRAVENOUS

## 2016-06-22 MED ORDER — DEXTROSE 5 % IV SOLN
INTRAVENOUS | Status: DC | PRN
  Administered 2016-06-22: 30 ug/min via INTRAVENOUS

## 2016-06-22 MED ORDER — MIDAZOLAM HCL 2 MG/2ML IJ SOLN
INTRAMUSCULAR | Status: AC
  Filled 2016-06-22: qty 2

## 2016-06-22 MED ORDER — DARBEPOETIN ALFA 100 MCG/0.5ML IJ SOSY
PREFILLED_SYRINGE | INTRAMUSCULAR | Status: AC
  Administered 2016-06-22: 100 ug via INTRAVENOUS
  Filled 2016-06-22: qty 0.5

## 2016-06-22 MED ORDER — FENTANYL CITRATE (PF) 100 MCG/2ML IJ SOLN
INTRAMUSCULAR | Status: DC | PRN
  Administered 2016-06-22 (×2): 25 ug via INTRAVENOUS

## 2016-06-22 MED ORDER — PROPOFOL 500 MG/50ML IV EMUL
INTRAVENOUS | Status: DC | PRN
  Administered 2016-06-22: 75 ug/kg/min via INTRAVENOUS

## 2016-06-22 MED ORDER — LIDOCAINE-EPINEPHRINE (PF) 1 %-1:200000 IJ SOLN
INTRAMUSCULAR | Status: AC
  Filled 2016-06-22: qty 30

## 2016-06-22 MED ORDER — LIDOCAINE-EPINEPHRINE 0.5 %-1:200000 IJ SOLN
INTRAMUSCULAR | Status: DC | PRN
  Administered 2016-06-22: 30 mL

## 2016-06-22 MED ORDER — PROPOFOL 10 MG/ML IV BOLUS
INTRAVENOUS | Status: DC | PRN
  Administered 2016-06-22 (×2): 20 mg via INTRAVENOUS
  Administered 2016-06-22: 30 mg via INTRAVENOUS

## 2016-06-22 MED ORDER — SODIUM CHLORIDE 0.9 % IV SOLN
INTRAVENOUS | Status: DC
  Administered 2016-06-22 (×2): via INTRAVENOUS

## 2016-06-22 MED ORDER — LIDOCAINE-PRILOCAINE 2.5-2.5 % EX CREA: 1.0000 "application " | TOPICAL_CREAM | CUTANEOUS | Status: DC | PRN

## 2016-06-22 MED ORDER — HEPARIN SODIUM (PORCINE) 1000 UNIT/ML DIALYSIS: 1000.0000 [IU] | INTRAMUSCULAR | Status: DC | PRN

## 2016-06-22 MED ORDER — PENTAFLUOROPROP-TETRAFLUOROETH EX AERO: 1.0000 "application " | INHALATION_SPRAY | CUTANEOUS | Status: DC | PRN

## 2016-06-22 MED ORDER — SODIUM CHLORIDE 0.9 % IV SOLN
INTRAVENOUS | Status: DC | PRN
  Administered 2016-06-22: 09:00:00

## 2016-06-22 MED ORDER — 0.9 % SODIUM CHLORIDE (POUR BTL) OPTIME
TOPICAL | Status: DC | PRN
  Administered 2016-06-22: 1000 mL

## 2016-06-22 SURGICAL SUPPLY — 37 items
ARMBAND PINK RESTRICT EXTREMIT (MISCELLANEOUS) ×4 IMPLANT
BENZOIN TINCTURE PRP APPL 2/3 (GAUZE/BANDAGES/DRESSINGS) ×2 IMPLANT
CANISTER SUCTION 2500CC (MISCELLANEOUS) ×2 IMPLANT
CANNULA VESSEL 3MM 2 BLNT TIP (CANNULA) ×2 IMPLANT
CLIP LIGATING EXTRA MED SLVR (CLIP) ×2 IMPLANT
CLIP LIGATING EXTRA SM BLUE (MISCELLANEOUS) ×2 IMPLANT
COVER PROBE W GEL 5X96 (DRAPES) ×2 IMPLANT
DECANTER SPIKE VIAL GLASS SM (MISCELLANEOUS) ×2 IMPLANT
ELECT REM PT RETURN 9FT ADLT (ELECTROSURGICAL) ×2
ELECTRODE REM PT RTRN 9FT ADLT (ELECTROSURGICAL) ×1 IMPLANT
GAUZE SPONGE 2X2 8PLY STRL LF (GAUZE/BANDAGES/DRESSINGS) ×1 IMPLANT
GAUZE SPONGE 4X4 12PLY STRL (GAUZE/BANDAGES/DRESSINGS) ×2 IMPLANT
GEL ULTRASOUND 20GR AQUASONIC (MISCELLANEOUS) IMPLANT
GLOVE BIO SURGEON STRL SZ 6.5 (GLOVE) ×2 IMPLANT
GLOVE BIOGEL PI IND STRL 6.5 (GLOVE) ×3 IMPLANT
GLOVE BIOGEL PI IND STRL 7.0 (GLOVE) ×1 IMPLANT
GLOVE BIOGEL PI INDICATOR 6.5 (GLOVE) ×3
GLOVE BIOGEL PI INDICATOR 7.0 (GLOVE) ×1
GLOVE SS BIOGEL STRL SZ 7.5 (GLOVE) ×1 IMPLANT
GLOVE SUPERSENSE BIOGEL SZ 7.5 (GLOVE) ×1
GLOVE SURG SS PI 6.0 STRL IVOR (GLOVE) ×2 IMPLANT
GOWN STRL NON-REIN LRG LVL3 (GOWN DISPOSABLE) ×2 IMPLANT
GOWN STRL REUS W/ TWL LRG LVL3 (GOWN DISPOSABLE) ×2 IMPLANT
GOWN STRL REUS W/TWL LRG LVL3 (GOWN DISPOSABLE) ×2
KIT BASIN OR (CUSTOM PROCEDURE TRAY) ×2 IMPLANT
KIT ROOM TURNOVER OR (KITS) ×2 IMPLANT
NS IRRIG 1000ML POUR BTL (IV SOLUTION) ×2 IMPLANT
PACK CV ACCESS (CUSTOM PROCEDURE TRAY) ×2 IMPLANT
PAD ARMBOARD 7.5X6 YLW CONV (MISCELLANEOUS) ×4 IMPLANT
SPONGE GAUZE 2X2 STER 10/PKG (GAUZE/BANDAGES/DRESSINGS) ×1
STRIP CLOSURE SKIN 1/2X4 (GAUZE/BANDAGES/DRESSINGS) ×2 IMPLANT
SUT PROLENE 6 0 CC (SUTURE) ×2 IMPLANT
SUT VIC AB 3-0 SH 27 (SUTURE) ×1
SUT VIC AB 3-0 SH 27X BRD (SUTURE) ×1 IMPLANT
TAPE CLOTH SURG 4X10 WHT LF (GAUZE/BANDAGES/DRESSINGS) ×2 IMPLANT
UNDERPAD 30X30 (UNDERPADS AND DIAPERS) ×2 IMPLANT
WATER STERILE IRR 1000ML POUR (IV SOLUTION) ×2 IMPLANT

## 2016-06-22 NOTE — Progress Notes (Addendum)
      [address]       [place] [address]             [phone]        9 Days Post-Op Procedure(s) (LRB): INSERTION OF DIALYSIS CATHETER RIGHT INTERNAL JUGULAR (Right)  Subjective: Patient just finished walking. Feels tired.  Objective: Vital signs in last 24 hours: Temp:  [98.2 F (36.8 C)-99 F (37.2 C)] 98.2 F (36.8 C) (10/09 0535) Pulse Rate:  [62-67] 64 (10/09 0535) Cardiac Rhythm: Normal sinus rhythm (10/08 2006) Resp:  [17-20] 18 (10/09 0535) BP: (118-148)/(59-80) 135/75 (10/09 0535) SpO2:  [91 %-95 %] 91 % (10/09 0535) Weight:  [131 lb 6.4 oz (59.6 kg)] 131 lb 6.4 oz (59.6 kg) (10/09 0535)  Pre op weight 79 kg Current Weight  06/22/16 131 lb 6.4 oz (59.6 kg)      Intake/Output from previous day: 10/08 0701 - 10/09 0700 In: 120 [P.O.:120] Out: -    Physical Exam:  Cardiovascular: RRR Pulmonary: Clear to auscultation bilaterally Abdomen: Soft, non tender, bowel sounds present. Extremities: No lower extremity edema. Wounds: Clean and dry.  No erythema or signs of infection.  Lab Results: CBC:  Recent Labs  06/22/16 0214  WBC 8.7  HGB 10.5*  HCT 31.7*  PLT 273   BMET:   Recent Labs  06/20/16 0306 06/22/16 0214  NA 134* 133*  K 4.0 4.1  CL 101 98*  CO2 23 26  GLUCOSE 156* 132*  BUN 17 25*  CREATININE 3.16* 4.08*  CALCIUM 8.3* 8.3*    PT/INR:  Lab Results  Component Value Date   INR 1.10 06/12/2016   INR 1.47 06/06/2016   INR 0.96 05/31/2016   ABG:  INR: Will add last result for INR, ABG once components are confirmed Will add last 4 CBG results once components are confirmed  Assessment/Plan:  1. CV - Previously A paced. Still with EPW but not on and HR on average in the low 60's.On Coreg 3.125 mg bid. 2.  Pulmonary - On room air.  Encourage incentive spirometer and flutter valve. 3. DM-CBGs 208/162/91. On Insulin and was increased. Pre op HGA1C 10.4. Will need close medical follow up after  discharge. 4.  Acute blood loss anemia - H and H stable at 10.5 and 31.7. On Aranesp. 5. CKD-stage 4.Creatinine increased from 3.16 to 4.08. Nephrology following. For long term access with vascular today. 6. Depression-on Celexa 7. Wife works during the day so may need short term facility if no other family members available. Will ask OT to eval and ask Dr. Prescott Gum, if CIR or SNF possible   ZIMMERMAN,DONIELLE MPA-C 06/22/2016,7:47 AM  Remove EPWs in am tues and send home with [facility] for restorative care - face to face completed He will not agree to SNF and won't eat the food  patient examined and medical record reviewed,agree with above note. Tharon Aquas Trigt III 06/22/2016

## 2016-06-22 NOTE — Transfer of Care (Signed)
Immediate Anesthesia Transfer of Care Note  Patient: Kenneth [name]  Procedure(s) Performed: Procedure(s): ARTERIOVENOUS (AV) FISTULA CREATION LEFT UPPER ARM (Left)  Patient Location: PACU  Anesthesia Type:MAC  Level of Consciousness: sedated  Airway & Oxygen Therapy: Patient Spontanous Breathing and Patient connected to face mask oxygen  Post-op Assessment: Report given to RN and Post -op Vital signs reviewed and stable  Post vital signs: Reviewed and stable  Last Vitals:  Vitals:   06/22/16 0901 06/22/16 1141  BP: (!) 79/53   Pulse: 64   Resp:    Temp:  36.5 C    Last Pain:  Vitals:   06/22/16 0535  TempSrc: Oral  PainSc:       Patients Stated Pain Goal: 0 (23/95/32 0233)  Complications: No apparent anesthesia complications

## 2016-06-22 NOTE — Anesthesia Preprocedure Evaluation (Signed)
Anesthesia Evaluation  Patient identified by MRN, date of birth, ID band Patient awake    Reviewed: Allergy & Precautions, H&P , NPO status , Patient's Chart, lab work & pertinent test results, reviewed documented beta blocker date and time   Airway Mallampati: III  TM Distance: >3 FB Neck ROM: Full    Dental no notable dental hx. (+) Teeth Intact, Dental Advisory Given   Pulmonary former smoker,    Pulmonary exam normal breath sounds clear to auscultation       Cardiovascular hypertension, + CAD, + Past MI, + CABG, + Peripheral Vascular Disease and +CHF   Rhythm:Regular Rate:Normal     Neuro/Psych negative neurological ROS  negative psych ROS   GI/Hepatic negative GI ROS, Neg liver ROS,   Endo/Other  diabetes  Renal/GU CRF and DialysisRenal disease  negative genitourinary   Musculoskeletal   Abdominal   Peds  Hematology negative hematology ROS (+)   Anesthesia Other Findings   Reproductive/Obstetrics negative OB ROS                             Anesthesia Physical  Anesthesia Plan  ASA: IV  Anesthesia Plan: MAC   Post-op Pain Management:    Induction: Intravenous  Airway Management Planned: Simple Face Mask  Additional Equipment:   Intra-op Plan:   Post-operative Plan:   Informed Consent: I have reviewed the patients History and Physical, chart, labs and discussed the procedure including the risks, benefits and alternatives for the proposed anesthesia with the patient or authorized representative who has indicated his/her understanding and acceptance.   Dental advisory given  Plan Discussed with: CRNA  Anesthesia Plan Comments:         Anesthesia Quick Evaluation

## 2016-06-22 NOTE — H&P (View-Only) (Signed)
Spoke with pt and son regarding placement of left arm AV fistula.  Pt is agreeable at this time.  Will plan left brachial cephalic AVF as per Dr Lianne Moris prior consult note.  This is scheduled for Dr Donnetta Hutching on Monday 06/22/16  [name], MD [facility] Office: [phone] Pager: [phone]

## 2016-06-22 NOTE — Care Management Note (Signed)
Case Management Note Previous CM note initiated by Maryclare Labrador, RN 06/16/2016, 8:58 AM    Patient Details  Name: Kenneth [name] MRN: [id number] Date of Birth: [birth year]  Subjective/Objective:     Pt s/p CABG   - currently on CRRT               Action/Plan:  Pt is from home with wife.  Recommendations for CIR - CSW consulted for back up plan    Expected Discharge Date:                  Expected Discharge Plan:  [place]  In-House Referral:     Discharge planning Services  CM Consult  Post Acute Care Choice:  Home Health Choice offered to:  Patient  DME Arranged:    DME Agency:     HH Arranged:    [place] Agency:     Status of Service:  In process, will continue to follow  If discussed at Long Length of Stay Meetings, dates discussed:    Additional Comments:  06/22/16- 1500 - Marvetta Gibbons RN, BSN- referral for [facility]- went by to see pt- pt in HD - will f/u with pt regarding [facility] arrangements and HH choice- in am prior to discharge.   Discussed in LOS 06/16/16:  Pt remains appropriate for continued stay 3 Days Post-Op Procedure(s) (LRB): INSERTION OF DIALYSIS CATHETER RIGHT INTERNAL JUGULAR (Right) CXR small L effusion,  Needs A-pacing for sinus brady, transfer orders for stepdown written.  Last PT eval recommends HH - PT ordered to re-eval today -  CM will continue to follow for discharge needs    Dawayne Patricia, RN 06/22/2016, 3:52 PM [phone]

## 2016-06-22 NOTE — Progress Notes (Signed)
Patient arrived to unit by bed.  Reviewed treatment plan and this RN agrees with plan.  Report received from bedside RN, Lauren.  Consent verified.  Patient Responds to voice, oriented X 4.   Lung sounds clear to ausculation in all fields. Generalized edema. Cardiac:  NSR.  Removed caps and cleansed RIJ catheter with chlorhedxidine.  Aspirated ports of heparin and flushed them with saline per protocol.  Connected and secured lines, initiated treatment at 1505.  UF Goal of 540mL and net fluid removal 0L.  Will continue to monitor.

## 2016-06-22 NOTE — Anesthesia Postprocedure Evaluation (Signed)
Anesthesia Post Note  Patient: Kenneth [name]  Procedure(s) Performed: Procedure(s) (LRB): ARTERIOVENOUS (AV) FISTULA CREATION LEFT UPPER ARM (Left)  Patient location during evaluation: PACU Anesthesia Type: MAC Level of consciousness: awake and alert Pain management: pain level controlled Vital Signs Assessment: post-procedure vital signs reviewed and stable Respiratory status: spontaneous breathing, nonlabored ventilation, respiratory function stable and patient connected to nasal cannula oxygen Cardiovascular status: stable and blood pressure returned to baseline Anesthetic complications: no    Last Vitals:  Vitals:   06/22/16 1155 06/22/16 1205  BP: (!) 113/59 105/60  Pulse: 62 61  Resp: 12 12  Temp:      Last Pain:  Vitals:   06/22/16 1205  TempSrc:   PainSc: 0-No pain                 Zenaida Deed

## 2016-06-22 NOTE — Interval H&P Note (Signed)
History and Physical Interval Note:  06/22/2016 10:04 AM  Kenneth [name]  has presented today for surgery, with the diagnosis of ESRD  The various methods of treatment have been discussed with the patient and family. After consideration of risks, benefits and other options for treatment, the patient has consented to  Procedure(s): ARTERIOVENOUS (AV) FISTULA CREATION (Left) as a surgical intervention .  [name] history has been reviewed, patient examined, no change in status, stable for surgery.  I have reviewed the patient's chart and labs.  Questions were answered to the patient's satisfaction.     Curt Jews

## 2016-06-22 NOTE — Progress Notes (Signed)
Dialysis treatment completed.  500 mL ultrafiltrated.  0 mL net fluid removal.  Patient status unchanged. Lung sounds clear to ausculation in all fields. Generalized edema. Cardiac: NSR.  Cleansed RIJ catheter with chlorhexidine.  Disconnected lines and flushed ports with saline per protocol.  Ports locked with heparin and capped per protocol.    Report given to bedside, RN Lauren.

## 2016-06-22 NOTE — Progress Notes (Signed)
Inpatient Diabetes Program Recommendations  AACE/ADA: New Consensus Statement on Inpatient Glycemic Control (2015)  Target Ranges:  Prepandial:   less than 140 mg/dL      Peak postprandial:   less than 180 mg/dL (1-2 hours)      Critically ill patients:  140 - 180 mg/dL   Lab Results  Component Value Date   GLUCAP 115 (H) 06/22/2016   HGBA1C 10.4 (H) 06/04/2016    Review of Glycemic Control Results for TORSTEN, Kenneth [name] (MRN [id number]) as of 06/22/2016 11:21  Ref. Range 06/21/2016 06:40 06/21/2016 11:38 06/21/2016 16:05 06/21/2016 21:31 06/22/2016 06:19 06/22/2016 08:20  Glucose-Capillary Latest Ref Range: 65 - 99 mg/dL 194 (H) 169 (H) 208 (H) 162 (H) 91 115 (H)    Inpatient Diabetes Program Recommendations:  Noted Levemir increased to 13 units. Fasting CBG this am 91. Please consider decrease in Levemir to 11 units bid.  Thank you, Nani Gasser. Lundynn Cohoon, RN, MSN, CDE Inpatient Glycemic Control Team Team Pager [phone] (8am-5pm) 06/22/2016 11:24 AM

## 2016-06-22 NOTE — Progress Notes (Signed)
S: No new CO O:BP 135/75 (BP Location: Left Arm)   Pulse 64   Temp 98.2 F (36.8 C) (Oral)   Resp 18   Ht 5\' 6"  (1.676 m)   Wt 59.6 kg (131 lb 6.4 oz)   SpO2 91%   BMI 21.21 kg/m   Intake/Output Summary (Last 24 hours) at 06/22/16 0714 Last data filed at 06/21/16 1733  Gross per 24 hour  Intake              120 ml  Output                0 ml  Net              120 ml   Weight change: -0.408 kg (-14.4 oz) UTM:LYYTK and alert CVS:RRR Resp:Sl decreased BS bases Abd:+ BS NTND Ext:No edema NEURO:CNI, Ox3, no asterixis Rt IJ perm cath   . aspirin EC  325 mg Oral Daily   Or  . aspirin  324 mg Per Tube Daily  . atorvastatin  80 mg Oral q1800  . bisacodyl  10 mg Oral Daily   Or  . bisacodyl  10 mg Rectal Daily  . carvedilol  3.125 mg Oral BID WC  . cefUROXime (ZINACEF)  IV  1.5 g Intravenous To SS-Surg  . chlorhexidine  15 mL Mouth Rinse BID  . citalopram  20 mg Oral Daily  . darbepoetin (ARANESP) injection - DIALYSIS  100 mcg Intravenous Q Sat-HD  . docusate sodium  200 mg Oral Daily  . feeding supplement (GLUCERNA SHAKE)  237 mL Oral TID BM  . heparin subcutaneous  5,000 Units Subcutaneous Q12H  . insulin aspart  0-24 Units Subcutaneous TID WC  . insulin detemir  13 Units Subcutaneous BID  . mouth rinse  15 mL Mouth Rinse q12n4p  . multivitamin  1 tablet Oral QHS  . pantoprazole  40 mg Oral Q1200  . sodium chloride flush  10-40 mL Intracatheter Q12H  . sodium chloride flush  10-40 mL Intracatheter Q12H   No results found. BMET    Component Value Date/Time   NA 133 (L) 06/22/2016 0214   K 4.1 06/22/2016 0214   CL 98 (L) 06/22/2016 0214   CO2 26 06/22/2016 0214   GLUCOSE 132 (H) 06/22/2016 0214   BUN 25 (H) 06/22/2016 0214   CREATININE 4.08 (H) 06/22/2016 0214   CALCIUM 8.3 (L) 06/22/2016 0214   GFRNONAA 15 (L) 06/22/2016 0214   GFRAA 17 (L) 06/22/2016 0214   CBC    Component Value Date/Time   WBC 8.7 06/22/2016 0214   RBC 3.48 (L) 06/22/2016 0214   HGB  10.5 (L) 06/22/2016 0214   HCT 31.7 (L) 06/22/2016 0214   PLT 273 06/22/2016 0214   MCV 91.1 06/22/2016 0214   MCH 30.2 06/22/2016 0214   MCHC 33.1 06/22/2016 0214   RDW 16.6 (H) 06/22/2016 0214     Assessment: 1.  Most likely ESRD.  For perm access and HD today.  Spot at [facility] MWF 2nd shift 2. DM 3. HTN 4. SP CABG 5. Anemia on aranesp 6. Sec HPTH  PTH 66   Plan: 1.  For access today and HD today 2. ? [place] T

## 2016-06-22 NOTE — Progress Notes (Signed)
SLP Cancellation Note  Patient Details Name: Kenneth [name] MRN: [id number] DOB: 1956-04-25   Cancelled treatment:       Reason Eval/Treat Not Completed: Patient declined PO intake s/p today's procedure. Per chart review, lungs are CTA; no change per CXR.  Concerns for aspiration appear to be abating.    Juan Quam Laurice 06/22/2016, 2:20 PM

## 2016-06-22 NOTE — Op Note (Signed)
    OPERATIVE REPORT  DATE OF SURGERY: 06/22/2016  PATIENT: Kenneth [name], 60 y.o. male MRN: [id number]  DOB: 1955/09/15  PRE-OPERATIVE DIAGNOSIS: End-stage renal disease  POST-OPERATIVE DIAGNOSIS:  Same  PROCEDURE: Left brachiocephalic AV fistula creation  SURGEON:  Curt Jews, M.D.  PHYSICIAN ASSISTANT: Nurse  ANESTHESIA:  Local with sedation  EBL: Minimal ml  Total I/O In: 300 [I.V.:300] Out: 20 [Blood:20]  BLOOD ADMINISTERED: None  DRAINS: None  SPECIMEN: None  COUNTS CORRECT:  YES  PLAN OF CARE: PACU   PATIENT DISPOSITION:  PACU - hemodynamically stable  PROCEDURE DETAILS: The patient was taken to the operating placed supine position where the area of the left arm prepped draped in sterile fashion. SonoSite ultrasound was used to visualize the cephalic vein at the antecubital space and this was of adequate size for fistula attempt. Using local anesthesia incision was made over the antecubital space and carried down to isolate the brachial artery which was a large caliber and the cephalic vein. Tributary branches were ligated and divided off these cephalic vein. The vein was mobilized distally and was ligated distally and divided. The vein was marked to prevent twisting. The vein was brought into approximation of the artery. Brachial artery was occluded proximally and distally and was opened with 11 blade and sent longitudinally with Potts scissors. The vein was cut to appropriate length and was spatulated and sewn end-to-side to the artery with a running 6-0 Prolene suture. Clamps were removed and excellent thrill was noted. The wound irrigated with saline. Hemostasis tablet cautery. Wounds were closed with 3-0 Vicryl in the subcutaneous and subcuticular tissue. Benzoin and Steri-Strips were applied   Kenneth [name], M.D., [facility] 06/22/2016 2:33 PM

## 2016-06-22 NOTE — Anesthesia Procedure Notes (Addendum)
Procedure Name: MAC Date/Time: 06/22/2016 10:19 AM Performed by: Merrilyn Puma B Pre-anesthesia Checklist: Patient identified, Emergency Drugs available, Suction available, Patient being monitored and Timeout performed Patient Re-evaluated:Patient Re-evaluated prior to inductionOxygen Delivery Method: Simple face mask

## 2016-06-22 NOTE — Progress Notes (Signed)
Pt's fistula placement site has a small amount of drainage noted. MD aware. MD stated to keep arm straight and elevated. Pt's dressing was reinforced. Pt arm is laying flat and is elevated with pillows. Will continue to monitor.   Grant Fontana BSN, RN

## 2016-06-22 NOTE — Progress Notes (Signed)
Physical Therapy Treatment Patient Details Name: Kenneth [name] MRN: [id number] DOB: 09-28-1955 Today's Date: 06/22/2016    History of Present Illness Pt adm with SOB and required emergent intubation and cardiac cath. Underwent CABG on 06/05/16. Requiring CRRT post op, discontinued 9/29. PMH - DM, CKD, HTN    PT Comments    Pt remains pleasant and able to state 3/5 precautions on arrival today with education for all throughout session. Pt states wife works at [employer] and unsure if pt has 24hr assist for D/C. Pt educated for gait and mobility but did not attempt stairs due to dizziness today. Will plan to attempt next date.   BP 83/61 supine, MAP 69, HR 58 79/53 sitting, MAP 62, HR 63 100/54 standing, MAP 69, HR70  Follow Up Recommendations  Home health PT;Supervision/Assistance - 24 hour     Equipment Recommendations       Recommendations for Other Services       Precautions / Restrictions Precautions Precautions: Fall;Sternal    Mobility  Bed Mobility Overal bed mobility: Needs Assistance Bed Mobility: Supine to Sit;Sit to Supine     Supine to sit: Min guard Sit to supine: Min guard   General bed mobility comments: cues to maintain precautions not to push  Transfers Overall transfer level: Needs assistance   Transfers: Sit to/from Stand Sit to Stand: Supervision         General transfer comment: cues for hand placement and safety  Ambulation/Gait Ambulation/Gait assistance: Min guard Ambulation Distance (Feet): 450 Feet Assistive device: Rolling walker (2 wheeled) Gait Pattern/deviations: Step-through pattern;Decreased stride length   Gait velocity interpretation: Below normal speed for age/gender General Gait Details: cues for posture, 2 standing rest breaks grossly 10 sec each. Pt reported dizziness with gait and did not attempts stairs   Stairs            Wheelchair Mobility    Modified Rankin (Stroke Patients Only)       Balance                                     Cognition Arousal/Alertness: Awake/alert Behavior During Therapy: Flat affect Overall Cognitive Status: Impaired/Different from baseline       Memory: Decreased recall of precautions         General Comments: pt able to state 3/5 precautions on arrival    Exercises General Exercises - Lower Extremity Long Arc Quad: AROM;Both;Seated;15 reps Hip Flexion/Marching: AROM;15 reps;Both;Seated    General Comments        Pertinent Vitals/Pain Pain Assessment: No/denies pain    Home Living                      Prior Function            PT Goals (current goals can now be found in the care plan section) Progress towards PT goals: Progressing toward goals    Frequency           PT Plan Current plan remains appropriate    Co-evaluation             End of Session Equipment Utilized During Treatment: Gait belt Activity Tolerance: Patient tolerated treatment well Patient left: in bed;with call bell/phone within reach;with bed alarm set     Time: 7096-2836 PT Time Calculation (min) (ACUTE ONLY): 20 min  Charges:  $Gait Training: 8-22 mins  G CodesLanetta Inch Beth 27-Jun-2016, 9:04 AM Elwyn Reach, [place]

## 2016-06-23 ENCOUNTER — Telehealth: Payer: Self-pay | Admitting: Vascular Surgery

## 2016-06-23 ENCOUNTER — Inpatient Hospital Stay (HOSPITAL_COMMUNITY): Payer: BLUE CROSS/BLUE SHIELD

## 2016-06-23 ENCOUNTER — Encounter (HOSPITAL_COMMUNITY): Payer: Self-pay | Admitting: Vascular Surgery

## 2016-06-23 LAB — CBC
HCT: 35 % — ABNORMAL LOW (ref 39.0–52.0)
Hemoglobin: 10.9 g/dL — ABNORMAL LOW (ref 13.0–17.0)
MCH: 29.1 pg (ref 26.0–34.0)
MCHC: 31.1 g/dL (ref 30.0–36.0)
MCV: 93.6 fL (ref 78.0–100.0)
Platelets: 246 10*3/uL (ref 150–400)
RBC: 3.74 MIL/uL — ABNORMAL LOW (ref 4.22–5.81)
RDW: 16.5 % — ABNORMAL HIGH (ref 11.5–15.5)
WBC: 8 10*3/uL (ref 4.0–10.5)

## 2016-06-23 LAB — GLUCOSE, CAPILLARY
Glucose-Capillary: 104 mg/dL — ABNORMAL HIGH (ref 65–99)
Glucose-Capillary: 147 mg/dL — ABNORMAL HIGH (ref 65–99)
Glucose-Capillary: 230 mg/dL — ABNORMAL HIGH (ref 65–99)

## 2016-06-23 MED ORDER — OXYCODONE HCL 5 MG PO TABS: 5.0000 mg | ORAL_TABLET | Freq: Four times a day (QID) | ORAL | 0 refills | Status: DC | PRN

## 2016-06-23 MED ORDER — ASPIRIN 325 MG PO TBEC: 325.0000 mg | DELAYED_RELEASE_TABLET | Freq: Every day | ORAL | 0 refills | Status: DC

## 2016-06-23 MED ORDER — CARVEDILOL 3.125 MG PO TABS: 3.1250 mg | ORAL_TABLET | Freq: Every day | ORAL | 1 refills | Status: DC

## 2016-06-23 MED ORDER — ATORVASTATIN CALCIUM 80 MG PO TABS
80.0000 mg | ORAL_TABLET | Freq: Every day | ORAL | 1 refills | Status: DC
Start: 2016-06-23 — End: 2016-08-11

## 2016-06-23 MED ORDER — CARVEDILOL 3.125 MG PO TABS: 3.1250 mg | ORAL_TABLET | Freq: Every day | ORAL | Status: DC

## 2016-06-23 MED ORDER — DARBEPOETIN ALFA 100 MCG/0.5ML IJ SOSY: 100.0000 ug | PREFILLED_SYRINGE | INTRAMUSCULAR | Status: DC

## 2016-06-23 MED ORDER — CITALOPRAM HYDROBROMIDE 20 MG PO TABS: 20.0000 mg | ORAL_TABLET | Freq: Every day | ORAL | 1 refills | Status: DC

## 2016-06-23 MED ORDER — SODIUM CHLORIDE 0.9 % IV SOLN
Freq: Once | INTRAVENOUS | Status: AC
Start: 2016-06-23 — End: 2016-06-23
  Administered 2016-06-23: 16:00:00 via INTRAVENOUS

## 2016-06-23 MED ORDER — CARVEDILOL 3.125 MG PO TABS: 3.1250 mg | ORAL_TABLET | Freq: Two times a day (BID) | ORAL | 1 refills | Status: DC

## 2016-06-23 NOTE — Progress Notes (Signed)
Physical Therapy Treatment Patient Details Name: Kenneth [name] MRN: [id number] DOB: 01-Oct-1955 Today's Date: 06/23/2016    History of Present Illness Pt adm with SOB and required emergent intubation and cardiac cath. Underwent CABG on 06/05/16. Requiring CRRT post op, discontinued 9/29. PMH - DM, CKD, HTN    PT Comments    Pt limited by dizziness when up. BP low and nursing aware. Pt needs max encouragement to get up and mobilize. Slept all morning and reluctant to get up. Needs to stay up more and incr activity.  Follow Up Recommendations  Home health PT;Supervision/Assistance - 24 hour     Equipment Recommendations  Rolling walker with 5" wheels    Recommendations for Other Services       Precautions / Restrictions Precautions Precautions: Fall;Sternal Restrictions Weight Bearing Restrictions: Yes Other Position/Activity Restrictions: sternal--pt stated he did not know any of them (I reviewed them with he and his mother using words and motions)    Mobility  Bed Mobility Overal bed mobility: Needs Assistance Bed Mobility: Sidelying to Sit Rolling: Supervision Sidelying to sit: Supervision       General bed mobility comments: Incr time  Transfers Overall transfer level: Needs assistance Equipment used: Rolling walker (2 wheeled) Transfers: Sit to/from Stand Sit to Stand: Supervision Stand pivot transfers: Supervision       General transfer comment: Assist for safety  Ambulation/Gait Ambulation/Gait assistance: Min assist;Supervision Ambulation Distance (Feet): 50 Feet (50' x1, 110' x1) Assistive device: Rolling walker (2 wheeled) Gait Pattern/deviations: Step-through pattern;Decreased stride length;Drifts right/left Gait velocity: decr Gait velocity interpretation: Below normal speed for age/gender General Gait Details: Pt reported becoming very dizzy after amb ~50' and had to stop and sit down. BP 93/50. After 2-3 minutes pt stood again with BP checked in  standing with it 91/53. Able to continue amb and reported some dizziness but not as bad. BP 84/42 in standing when back to the room taken in standing.   Stairs            Wheelchair Mobility    Modified Rankin (Stroke Patients Only)       Balance Overall balance assessment: Needs assistance Sitting-balance support: No upper extremity supported Sitting balance-Leahy Scale: Good     Standing balance support: No upper extremity supported Standing balance-Leahy Scale: Fair                      Cognition Arousal/Alertness: Awake/alert Behavior During Therapy: Flat affect Overall Cognitive Status: Impaired/Different from baseline Area of Impairment: Memory     Memory: Decreased recall of precautions;Decreased short-term memory         General Comments: Pt unable to state any of his sternal precautions    Exercises      General Comments        Pertinent Vitals/Pain Pain Assessment: No/denies pain Faces Pain Scale: No hurt    Home Living                      Prior Function            PT Goals (current goals can now be found in the care plan section) Progress towards PT goals: Not progressing toward goals - comment (dizziness)    Frequency    Min 3X/week      PT Plan Current plan remains appropriate    Co-evaluation             End of Session Equipment Utilized During Treatment: Gait  belt Activity Tolerance: Other (comment) (Limited by light headed/dizziness) Patient left: with call bell/phone within reach;in chair;with family/visitor present     Time: 1332-1350 PT Time Calculation (min) (ACUTE ONLY): 18 min  Charges:  $Gait Training: 8-22 mins                    G Codes:      Jilliane Kazanjian 07/07/2016, 3:23 PM [employer] PT [phone]

## 2016-06-23 NOTE — Telephone Encounter (Signed)
-----   Message from Denman George, RN sent at 06/22/2016  1:03 PM EDT ----- Regarding: needs 4 week f/u with left arm access duplex and appt. with Dr. Donnetta Hutching   ----- Message ----- From: Alvia Grove, PA-C Sent: 06/22/2016  12:01 PM To: [facility] Charge Pool  S/p left upper arm fistula 06/22/16  F/u with Dr. Donnetta Hutching in 4 weeks with duplex  Thanks Maudie Mercury

## 2016-06-23 NOTE — Progress Notes (Signed)
CARDIAC REHAB PHASE I   Pt up in chair, states he is "very sleepy," just finished working with PT/OT. Cardiac surgery discharge education completed with pt and mother at bedside via interpreter phone with [name]. Reviewed IS, sternal precautions, activity progression, exercise, and phase 2 cardiac rehab. Will defer to renal diet. Pt verbalized understanding,needs reinforcement. Pt agrees to phase 2 cardiac rehab referral, will send to [facility] per pt request. Pt in recliner, call bell within reach.   [place], RN, BSN 06/23/2016 3:26 PM

## 2016-06-23 NOTE — Progress Notes (Signed)
S: CO pain Lt arm post AVF placement O:BP (!) 121/42 (BP Location: Right Arm)   Pulse 63   Temp 98.8 F (37.1 C) (Oral)   Resp 17   Ht 5\' 6"  (1.676 m)   Wt 59.6 kg (131 lb 4.8 oz)   SpO2 96%   BMI 21.19 kg/m   Intake/Output Summary (Last 24 hours) at 06/23/16 0635 Last data filed at 06/22/16 2055  Gross per 24 hour  Intake              800 ml  Output               20 ml  Net              780 ml   Weight change: 0.897 kg (1 lb 15.7 oz) IFO:YDXAJ and alert CVS:RRR Resp: decreased BS bases R>L Abd:+ BS NTND Ext:No edema.  LUA AVF + bruit NEURO:CNI, Ox3, no asterixis Rt IJ perm cath   . aspirin EC  325 mg Oral Daily   Or  . aspirin  324 mg Per Tube Daily  . atorvastatin  80 mg Oral q1800  . bisacodyl  10 mg Oral Daily   Or  . bisacodyl  10 mg Rectal Daily  . carvedilol  3.125 mg Oral BID WC  . chlorhexidine  15 mL Mouth Rinse BID  . citalopram  20 mg Oral Daily  . darbepoetin (ARANESP) injection - DIALYSIS  100 mcg Intravenous Q Sat-HD  . docusate sodium  200 mg Oral Daily  . feeding supplement (GLUCERNA SHAKE)  237 mL Oral TID BM  . heparin subcutaneous  5,000 Units Subcutaneous Q12H  . insulin aspart  0-24 Units Subcutaneous TID WC  . insulin detemir  13 Units Subcutaneous BID  . mouth rinse  15 mL Mouth Rinse q12n4p  . multivitamin  1 tablet Oral QHS  . pantoprazole  40 mg Oral Q1200  . sodium chloride flush  10-40 mL Intracatheter Q12H  . sodium chloride flush  10-40 mL Intracatheter Q12H   No results found. BMET    Component Value Date/Time   NA 133 (L) 06/22/2016 0214   K 4.1 06/22/2016 0214   CL 98 (L) 06/22/2016 0214   CO2 26 06/22/2016 0214   GLUCOSE 132 (H) 06/22/2016 0214   BUN 25 (H) 06/22/2016 0214   CREATININE 4.08 (H) 06/22/2016 0214   CALCIUM 8.3 (L) 06/22/2016 0214   GFRNONAA 15 (L) 06/22/2016 0214   GFRAA 17 (L) 06/22/2016 0214   CBC    Component Value Date/Time   WBC 8.0 06/23/2016 0313   RBC 3.74 (L) 06/23/2016 0313   HGB 10.9 (L)  06/23/2016 0313   HCT 35.0 (L) 06/23/2016 0313   PLT 246 06/23/2016 0313   MCV 93.6 06/23/2016 0313   MCH 29.1 06/23/2016 0313   MCHC 31.1 06/23/2016 0313   RDW 16.5 (H) 06/23/2016 0313     Assessment: 1.  Most likely ESRD.  For perm access and HD today.  Spot at [facility] MWF 2nd shift 2. DM 3. HTN 4. SP CABG 5. Anemia on aranesp 6. Sec HPTH  PTH 66   Plan: 1.  HD tomorrow if still here   [place]ory Cornette T

## 2016-06-23 NOTE — Progress Notes (Signed)
Subjective: Interval History: none.. Mod pain at left antecubital incision  Objective: Vital signs in last 24 hours: Temp:  [97 F (36.1 C)-98.8 F (37.1 C)] 98.8 F (37.1 C) (10/10 0554) Pulse Rate:  [60-72] 63 (10/10 0554) Resp:  [12-17] 17 (10/10 0554) BP: (79-121)/(42-64) 121/42 (10/10 0554) SpO2:  [92 %-99 %] 96 % (10/10 0554) Weight:  [131 lb 4.8 oz (59.6 kg)-133 lb 6.1 oz (60.5 kg)] 131 lb 4.8 oz (59.6 kg) (10/10 0554)  Intake/Output from previous day: 10/09 0701 - 10/10 0700 In: 800 [P.O.:500; I.V.:300] Out: 20 [Blood:20] Intake/Output this shift: Total I/O In: 500 [P.O.:500] Out: 0   Incision healing.  No hematoma.  Good thrill  Lab Results:  Recent Labs  06/22/16 0214 06/23/16 0313  WBC 8.7 8.0  HGB 10.5* 10.9*  HCT 31.7* 35.0*  PLT 273 246   BMET  Recent Labs  06/22/16 0214  NA 133*  K 4.1  CL 98*  CO2 26  GLUCOSE 132*  BUN 25*  CREATININE 4.08*  CALCIUM 8.3*    Studies/Results: Dg Chest 2 View  Result Date: 06/16/2016 CLINICAL DATA:  Status post CABG EXAM: CHEST  2 VIEW COMPARISON:  Portable chest x-ray of June 13, 2016 FINDINGS: The lungs are well-expanded. The interstitial markings have improved. There remain small bilateral pleural effusions. Subsegmental atelectasis persists at both lung bases but has decreased in conspicuity. The heart is normal in size. There are post CABG changes. There is calcification in the wall of the aortic arch. There is no pulmonary vascular congestion. The dual-lumen dialysis catheter tip projects over the distal third of the SVC. The small caliber left internal jugular venous catheter tip projects at the junction of the right and left brachiocephalic veins. The sternal wires are intact. The bony thorax elsewhere is unremarkable. IMPRESSION: Interval resolution of pulmonary interstitial edema. Persistent small bilateral pleural effusions. Persistent lower left lower lobe atelectasis or pneumonia. Aortic  atherosclerosis. Electronically Signed   By: David  Martinique M.D.   On: 06/16/2016 07:18   Dg Chest Port 1 View  Result Date: 06/17/2016 CLINICAL DATA:  Status post coronary artery bypass graft. EXAM: PORTABLE CHEST 1 VIEW COMPARISON:  Radiograph of June 16, 2016. FINDINGS: Stable cardiomediastinal silhouette. Status post coronary bypass graft. Stable positioning of right internal jugular dialysis catheter as well as left internal jugular venous catheter. No pneumothorax is noted. Stable bibasilar subsegmental atelectasis or edema with associated pleural effusions is noted. Bony thorax is unremarkable. IMPRESSION: Stable support apparatus. Stable bibasilar opacities as described above. Electronically Signed   By: Marijo Conception, M.D.   On: 06/17/2016 07:32   Dg Chest Port 1 View  Result Date: 06/13/2016 CLINICAL DATA:  Postop dialysis catheter placement. EXAM: PORTABLE CHEST 1 VIEW COMPARISON:  Same day. FINDINGS: Stable cardiomediastinal silhouette. Status post coronary artery bypass graft. No pneumothorax is noted. Left internal jugular catheter is unchanged with distal tip in expected position of left innominate vein. Right internal dialysis catheter is noted with distal tip in expected position of cavoatrial junction. Bibasilar atelectasis is noted with minimal bilateral pleural effusions. Bony thorax is unremarkable. IMPRESSION: Right internal jugular dialysis catheter is noted with distal tip in expected position of cavoatrial junction. No pneumothorax is noted. Bibasilar subsegmental atelectasis and minimal effusions are noted. Electronically Signed   By: Marijo Conception, M.D.   On: 06/13/2016 16:24   Dg Chest Port 1 View  Result Date: 06/13/2016 CLINICAL DATA:  06/05/16 CORONARY ARTERY BYPASS GRAFTING (CABG) x3 LIMA to  LAD -SVG to OM -SVG to RCA and 06/13/16 INSERTION OF DIALYSIS CATHETER. Chest pain and SOB. EXAM: PORTABLE CHEST 1 VIEW COMPARISON:  06/11/2016 FINDINGS: Changes from CABG surgery  stable. No mediastinal widening. No mediastinal or hilar masses. There is opacity at both lung bases consistent with combination of atelectasis and pleural fluid. No convincing change from prior study allowing for differences in patient positioning and radiographic technique. No evidence of pulmonary edema or pneumonia. No pneumothorax. Right subclavian dual lumen central venous catheter and left internal jugular central venous catheter are stable. IMPRESSION: 1. No significant change from prior study. Persistent pleural effusions with associated basilar atelectasis. No convincing pneumonia or pulmonary edema. Electronically Signed   By: Lajean Manes M.D.   On: 06/13/2016 08:14   Dg Chest Port 1 View  Result Date: 06/11/2016 CLINICAL DATA:  Chest soreness.  CABG EXAM: PORTABLE CHEST 1 VIEW COMPARISON:  Yesterday FINDINGS: Stable positioning of central lines, including left IJ catheter with tip at the distal left brachiocephalic vein. Left chest tube has been removed without pneumothorax. Unchanged basilar atelectasis, greater on the right with probable small effusion. Stable postoperative heart size. CABG changes. IMPRESSION: 1. Interval removal of left chest tube without pneumothorax. 2. Stable bibasilar atelectasis. Electronically Signed   By: Monte Fantasia M.D.   On: 06/11/2016 07:32   Dg Chest Port 1 View  Result Date: 06/10/2016 CLINICAL DATA:  Coronary artery disease, status post coronary artery bypass grafting. EXAM: PORTABLE CHEST 1 VIEW COMPARISON:  June 09, 2016 FINDINGS: Endotracheal tube and nasogastric tube have been removed in. Left jugular catheter tip is in the left innominate vein, stable. Right-sided central catheter tip is in the superior vena cava. There is a chest tube on the left. No pneumothorax evident. There is a fairly small pleural effusion on the right. There is patchy bibasilar atelectasis. There is no airspace consolidation. Heart is mildly enlarged with pulmonary  vascularity within normal limits. No adenopathy. There is atherosclerotic calcification in the aorta. IMPRESSION: Tube and catheter positions as described without pneumothorax. Small right pleural effusion with patchy bibasilar atelectasis. Stable cardiac prominence. Aortic atherosclerosis. Electronically Signed   By: Lowella Grip III M.D.   On: 06/10/2016 07:35   Dg Chest Port 1 View  Result Date: 06/09/2016 CLINICAL DATA:  60 year old male post CABG.  Subsequent encounter. EXAM: PORTABLE CHEST 1 VIEW COMPARISON:  06/08/2016 chest x-ray FINDINGS: Endotracheal tube tip 5.9 cm above the carina. Left central line tip left subclavian level. Nasogastric tube and Panda tube in place. Tip not imaged on the current exam. Right central line tip caval atrial junction/proximal right atrium without change. Mediastinal drain and left chest tube in place. Swan-Ganz catheter and right chest tube and been removed. No pneumothorax detected. Basilar atelectasis greater on the right without significant change. Cannot exclude small right-sided pleural effusion. Central pulmonary vascular prominence stable. Heart size top-normal post CABG. IMPRESSION: Right chest tube and Swan-Ganz catheter been removed. No pneumothorax detected. Remainder of findings without significant change as detailed above. Electronically Signed   By: Genia Del M.D.   On: 06/09/2016 08:29   Dg Chest Port 1 View  Result Date: 06/08/2016 CLINICAL DATA:  60 year old male with respiratory failure/ hypoxia post CABG. Subsequent encounter. EXAM: PORTABLE CHEST 1 VIEW COMPARISON:  06/07/2016 chest x-ray. FINDINGS: Endotracheal tube tip 6 cm above the carina. Left central line tip left subclavian vein level. Right Swan-Ganz catheter tip main pulmonary artery level. Right central line tip cavoatrial junction/ proximal right atrium level.  Bilateral chest tubes in place. Nasogastric tube partially coiled within the gastric fundus. Tip not completely imaged  on present exam. Post CABG.  Mild cardiomegaly. Poor inspiration with bibasilar atelectatic changes. Pulmonary vascular congestion most notable centrally. No gross pneumothorax. IMPRESSION: Post CABG with various lines and catheters in place as detailed above without significant change. Bibasilar atelectasis. Pulmonary vascular prominence most notable centrally. Electronically Signed   By: Genia Del M.D.   On: 06/08/2016 07:39   Dg Chest Port 1 View  Result Date: 06/07/2016 CLINICAL DATA:  60 year old male with a history of endotracheal tube removal. Admission for cardiac surgery with recent CABG. EXAM: PORTABLE CHEST 1 VIEW COMPARISON:  06/07/2016 FINDINGS: Cardiomediastinal silhouette unchanged. Surgical changes of recent median sternotomy and CABG. Endotracheal tube position suitably above the carina approximately 5.5 cm. Unchanged left IJ central venous catheter, right subclavian central venous catheter, right IJ sheath, through which a Swan-Ganz catheter appears to terminate in the main pulmonary artery. Unchanged gastric tube, terminating out of the field of view. Unchanged bilateral thoracostomy tubes and mediastinal drain. Epicardial pacing leads. Low lung volumes with minimal airspace opacities at the lung bases. No pneumothorax. IMPRESSION: Michoel Kunin surgical changes of median sternotomy and CABG, with unchanged support apparatus compared to the prior. Endotracheal tube terminates suitably above the carina, approximately 5.5 cm. Low lung volumes and atelectasis with no visualized pneumothorax. Signed, Dulcy Fanny. Earleen Newport, DO [facility] Electronically Signed   By: Corrie Mckusick D.O.   On: 06/07/2016 17:21   Dg Chest Port 1 View  Result Date: 06/07/2016 CLINICAL DATA:  Status post CABG. Acute respiratory failure with hypoxemia. EXAM: PORTABLE CHEST 1 VIEW COMPARISON:  06/06/2016. FINDINGS: Endotracheal tube in satisfactory position. Right  jugular Swan-Ganz catheter tip in the main pulmonary artery segment. Intra aortic balloon pump tip in the proximal descending thoracic aorta. Mediastinal and bilateral chest tubes remain in place without pneumothorax. Stable right subclavian catheter and left jugular catheter. Stable post CABG changes. The cardiac silhouette remains mildly enlarged. Increased dense left lower lobe opacity. Mildly decreased right basilar atelectasis. No acute bony abnormality. IMPRESSION: 1. Progressive dense left lower lobe atelectasis or pneumonia. 2. Mildly improved right basilar atelectasis. Electronically Signed   By: Claudie Revering M.D.   On: 06/07/2016 09:36   Dg Chest Port 1 View  Result Date: 06/06/2016 CLINICAL DATA:  Status post CABG surgery.  Subsequent encounter. EXAM: PORTABLE CHEST 1 VIEW COMPARISON:  06/05/2016 FINDINGS: There is persistent basilar opacity, greater on the right, consistent with atelectasis. No new lung abnormalities. No pulmonary edema. No pneumothorax. No mediastinal widening. Endotracheal tube, right internal jugular Swan-Ganz catheter, intra-aortic balloon pump, right subclavian dual lumen central venous catheter, nasal/orogastric tube, bilateral chest tubes and mediastinal tube are stable and well positioned. IMPRESSION: 1. No acute findings. No evidence of pulmonary edema, mediastinal widening or of a pneumothorax. 2. Persistent lung base opacity consistent with atelectasis, greater on the right. 3. Support apparatus is stable and well positioned. Electronically Signed   By: Lajean Manes M.D.   On: 06/06/2016 08:15   Dg Chest Portable 1 View  Result Date: 06/06/2016 CLINICAL DATA:  Status post chest tube placement. Assess for pneumothorax. Initial encounter. EXAM: PORTABLE CHEST 1 VIEW COMPARISON:  Chest radiograph performed earlier today at 4:50 a.m. FINDINGS: [name] endotracheal tube is seen ending 6 cm above the carina. A right subclavian line is noted ending about the cavoatrial  junction. A right IJ Swan-Ganz catheter is seen ending at  the pulmonary outflow tract. A left IJ line is noted ending about the left brachiocephalic vein. Bilateral chest tubes are noted, new from the prior study. No definite pneumothorax is seen. Mild bibasilar atelectasis is noted. Previously noted pleural effusions have resolved. The cardiomediastinal silhouette is borderline enlarged. The patient is status post median sternotomy, with evidence of prior CABG. The intra-aortic balloon pump tip is somewhat more inferior than typically seen, 3 cm below the aortic knob. This could be advanced 1-2 cm, as deemed clinically appropriate. No acute osseous abnormalities are seen. IMPRESSION: 1. Status post placement of bilateral chest tubes. No definite pneumothorax seen. Previously noted pleural effusions have resolved. Mild bibasilar atelectasis noted. 2. Endotracheal tube seen ending 6 cm above the carina. This could be advanced 2 cm, as deemed clinically appropriate. 3. Intra-aortic balloon pump tip is somewhat more inferior than typically seen, 3 cm below the aortic knob. This be advanced 1-2 cm, as deemed clinically appropriate. Electronically Signed   By: Garald Balding M.D.   On: 06/06/2016 00:03   Dg Chest Port 1 View  Result Date: 06/05/2016 CLINICAL DATA:  IABP placement EXAM: PORTABLE CHEST 1 VIEW COMPARISON:  06/04/2016 FINDINGS: Endotracheal tube with tip measuring 5.2 cm above the carina. Enteric tube tip is off the field of view but below the left hemidiaphragm. Right Swan-Ganz catheter with tip over the pulmonary outflow tract. IABP marker positioned over the upper descending aorta just below the aortic arch. Normal heart size and pulmonary vascularity. Bilateral pleural effusions with bibasilar infiltration or edema. No pneumothorax. Appearances are similar to previous study. IMPRESSION: Appliances appear in satisfactory position. Bilateral pleural effusions with bilateral basilar infiltration or  edema. Electronically Signed   By: Lucienne Capers M.D.   On: 06/05/2016 05:44   Dg Chest Port 1 View  Result Date: 06/04/2016 CLINICAL DATA:  Respiratory failure. EXAM: PORTABLE CHEST 1 VIEW COMPARISON:  Radiograph of June 03, 2016. FINDINGS: Stable cardiomediastinal silhouette. Endotracheal and nasogastric tubes are unchanged in position. Right internal jugular Swan-Ganz catheter is unchanged with distal tip in expected position of main pulmonary artery. Distal tip of aortic balloon catheter is unchanged in expected position of aortic arch. Atherosclerosis of thoracic aorta is noted. No pneumothorax is noted. Bibasilar opacities are noted concerning for atelectasis or edema with probable mild right pleural effusion. Bony thorax is unremarkable. IMPRESSION: Stable support apparatus. Stable bibasilar edema or atelectasis is noted with probable mild right pleural effusion. Electronically Signed   By: Marijo Conception, M.D.   On: 06/04/2016 08:21   Dg Chest Port 1 View  Result Date: 06/03/2016 CLINICAL DATA:  Post procedure film for left-sided thoracentesis EXAM: PORTABLE CHEST 1 VIEW COMPARISON:  06/03/2016 FINDINGS: ET tube tip is above the carina. There is a Swan-Ganz catheter with tip in the right ventricular outflow tract. Nasogastric tube tip is identified with side port below GE junction. The intra-aortic balloon pump is identified at the approximate T6 level. Heart size is normal. There has been interval improved aeration to the entire left lung status post thoracentesis. No significant pneumothorax identified. Thickening of the minor fissure within the right midlung is noted. Persistent airspace opacities within the right base. IMPRESSION: Decrease in volume of left pleural effusion with improved aeration to the entire left lung status post thoracentesis. No pneumothorax. Electronically Signed   By: Kerby Moors M.D.   On: 06/03/2016 12:22   Dg Chest Port 1 View  Result Date:  06/03/2016 CLINICAL DATA:  Acute onset of oxygen desaturation.  Initial encounter. EXAM: PORTABLE CHEST 1 VIEW COMPARISON:  Chest radiograph performed 06/02/2016 FINDINGS: [name] endotracheal tube is seen ending 3-4 cm above the carina. A right IJ Swan-Ganz catheter is noted ending about the right main pulmonary artery. An enteric tube is noted extending below the diaphragm. An intra-aortic balloon pump tip is noted overlying expected position. Worsening diffuse left-sided airspace opacification reflects a combination of airspace consolidation and increasing left-sided pleural effusion. A smaller right-sided pleural effusion is seen. This may reflect asymmetric pulmonary edema or pneumonia. No pneumothorax is seen. The cardiomediastinal silhouette is borderline normal in size. No acute osseous abnormalities are seen. IMPRESSION: 1. Endotracheal tube seen ending 3-4 cm above the carina. 2. Worsening diffuse left-sided airspace opacification reflects a combination of airspace consolidation and increasing left-sided pleural effusion. Smaller right-sided pleural effusion also noted. This may reflect asymmetric pulmonary edema or pneumonia. Electronically Signed   By: Garald Balding M.D.   On: 06/03/2016 02:17   Dg Chest Port 1 View  Result Date: 06/02/2016 CLINICAL DATA:  Central line complication. EXAM: PORTABLE CHEST 1 VIEW COMPARISON:  Radiograph of same day.  Bony thorax is unremarkable. FINDINGS: Endotracheal and nasogastric tubes are unchanged in position. Distal tip of right internal jugular Swan-Ganz catheter is now in expected position of main pulmonary artery. Distal tip of aortic balloon catheter is unchanged projected over aortic arch. No pneumothorax is noted. Stable bilateral perihilar and basilar opacities are noted concerning for edema or inflammation. Probable mild associated pleural effusions are noted as well. IMPRESSION: Right internal jugular Swan-Ganz catheter tip has been slightly  withdrawn, now on expected position of main pulmonary artery. Stable bilateral lung opacities are noted as described above. Other support apparatus are stable in position. Electronically Signed   By: Marijo Conception, M.D.   On: 06/02/2016 14:28   Dg Chest Port 1 View  Result Date: 06/02/2016 CLINICAL DATA:  Retraction of Swan-Ganz catheter EXAM: PORTABLE CHEST 1 VIEW COMPARISON:  06/02/2016 FINDINGS: Endotracheal tube, intra-aortic balloon pump, NG tube and right Swan-Ganz catheter remain in place, unchanged. The Swan-Ganz catheter remains in the right lower lobe pulmonary artery. Bilateral perihilar and lower lobe opacities are again noted, not significantly changed. IMPRESSION: Support devices are in stable position. No change in the location of the Swan-Ganz catheter. Continued bilateral perihilar and lower lobe airspace opacities. Electronically Signed   By: Rolm Baptise M.D.   On: 06/02/2016 13:21   Dg Chest Port 1 View  Result Date: 06/02/2016 CLINICAL DATA:  Status post thoracentesis. EXAM: PORTABLE CHEST 1 VIEW COMPARISON:  Portable radiograph earlier today at 10:11 a.m. FINDINGS: RIGHT thoracentesis has been performed. Despite overlying telemetry leads, there is no visible RIGHT apical or RIGHT lateral pneumothorax. Marked decrease RIGHT pleural effusion. Support tubes and lines remain stable. IMPRESSION: Marked decrease RIGHT pleural effusion.  No pneumothorax. Electronically Signed   By: Staci Righter M.D.   On: 06/02/2016 11:34   Dg Chest Port 1 View  Result Date: 06/02/2016 CLINICAL DATA:  Repositioning of Swan-Ganz catheter EXAM: PORTABLE CHEST 1 VIEW COMPARISON:  06/02/2016 FINDINGS: Swan-Ganz catheter is again noted deep within the right pulmonary artery. Intra-aortic balloon pump is noted in satisfactory position. An endotracheal tube and nasogastric catheter are again seen and stable. Bilateral pleural effusions and underlying atelectasis are again present. No new focal abnormality is  seen. IMPRESSION: Swan-Ganz catheter is again noted deep within the right lower lobe pulmonary artery. Tubes and lines as described above. Stable bilateral effusions and atelectatic changes. Electronically  Signed   By: Inez Catalina M.D.   On: 06/02/2016 10:18   Dg Chest Port 1 View  Result Date: 06/02/2016 CLINICAL DATA:  Congestive heart failure EXAM: PORTABLE CHEST 1 VIEW COMPARISON:  06/01/2016 FINDINGS: Endotracheal tube in good position. Intra-aortic balloon pump at the level of the aortic arch unchanged. NG in the stomach. Swan-Ganz catheter in a distal branch of the right lower lobe pulmonary artery. This appears more distally located than on the prior study. No pneumothorax Extensive bibasilar consolidation and bilateral effusions unchanged. Pulmonary vascular congestion. IMPRESSION: Endotracheal tube in good position. Swan-Ganz catheter more distally positioned in a distal branch of the right lower lobe pulmonary artery Bibasilar consolidation most likely atelectasis related to moderately large pleural effusions. This is most likely related to fluid overload and is unchanged. Electronically Signed   By: Franchot Gallo M.D.   On: 06/02/2016 07:31   Dg Chest Port 1 View  Result Date: 06/01/2016 CLINICAL DATA:  Intra-aortic balloon pump. EXAM: PORTABLE CHEST 1 VIEW COMPARISON:  05/31/2016 . FINDINGS: Endotracheal tube, NG tube, Swan-Ganz catheter, intra aortic balloon pump marker in stable position. Diffuse bilateral pulmonary infiltrates/edema again noted. Bilateral pleural effusions again noted. Persistent cardiomegaly. No pneumothorax IMPRESSION: 1. Lines and tubes including aortic balloon pump in stable position. 2. Cardiomegaly with diffuse bilateral pulmonary infiltrates and bilateral pleural effusions consistent with CHF again noted. No significant change . Electronically Signed   By: Marcello Moores  Register   On: 06/01/2016 07:41   Dg Chest Port 1 View  Result Date: 06/01/2016 CLINICAL DATA:  On  intra-aortic balloon pump assist EXAM: PORTABLE CHEST 1 VIEW COMPARISON:  05/31/2016 FINDINGS: Endotracheal tube with tip measuring 8.2 cm above the carina. Enteric tube tip is off the field of view but below the left hemidiaphragm. Right Swan-Ganz catheter is been placed with tip over the right hilum. No pneumothorax. Bilateral pleural effusions. Bilateral perihilar infiltrates likely to represent edema. Entry aortic balloon pump marker projects over the aortic arch. Aortic calcification. Mild cardiac enlargement. IMPRESSION: Appliances appear in satisfactory position. Bilateral pleural effusions and perihilar edema similar to prior study. Electronically Signed   By: Lucienne Capers M.D.   On: 06/01/2016 00:15   Dg Chest Port 1 View  Result Date: 05/31/2016 CLINICAL DATA:  Pulmonary edema, orogastric tube EXAM: PORTABLE CHEST 1 VIEW COMPARISON:  Portable exam 1757 hours compared to 1523 hours FINDINGS: Tip of endotracheal tube projects 7.2 cm above carina. Orogastric tube extends into stomach. Enlargement of cardiac silhouette with vascular congestion. Perihilar infiltrates likely representing pulmonary edema, slightly improved since previous exam. Bibasilar effusions and atelectasis ; unable to exclude consolidation in LEFT lower lobe were more dense opacification is identified. No pneumothorax. Bones demineralized. IMPRESSION: Slightly improved pulmonary edema. Bibasilar pleural effusions atelectasis, unable to exclude consolidation in LEFT lower lobe. Electronically Signed   By: Lavonia Dana M.D.   On: 05/31/2016 18:19   Dg Chest Portable 1 View  Result Date: 05/31/2016 CLINICAL DATA:  Chest pain.  Post intubation. EXAM: PORTABLE CHEST 1 VIEW COMPARISON:  05/31/2016 FINDINGS: Interval placement of a nasogastric tube which courses into the region of the stomach as tip is not visualized. Interval placement of endotracheal tube with tip 7.5 cm above the carina. Lungs are adequately inflated demonstrate  persistent bilateral perihilar bibasilar opacification likely due to interstitial edema with moderate bilateral pleural effusions and bibasilar atelectasis. Cannot exclude infection. Mild stable cardiomegaly. Remainder the exam is unchanged. IMPRESSION: Constellation of findings likely representing moderate interstitial edema/ CHF with  moderate bilateral effusions/bibasilar atelectasis. Cannot exclude infection in the mid to lower lungs. Tubes and lines as described. Electronically Signed   By: Marin Olp M.D.   On: 05/31/2016 15:59   Dg Chest Port 1 View  Result Date: 05/31/2016 CLINICAL DATA:  Patient with chest pain. EXAM: PORTABLE CHEST 1 VIEW COMPARISON:  None. FINDINGS: Patient is rotated to the right. Cardiomegaly. Bilateral perihilar and lower lung interstitial opacities and consolidation. Moderate bilateral pleural effusions. Biapical pleural parenchymal thickening. No pneumothorax. IMPRESSION: Cardiomegaly. Bilateral perihilar and lower lung interstitial opacities and consolidation favored to represent edema. Infection could have a similar appearance. Moderate bilateral pleural effusions. Electronically Signed   By: Lovey Newcomer M.D.   On: 05/31/2016 15:23   Dg Abd Portable 1v  Result Date: 06/08/2016 CLINICAL DATA:  60 year old male with a history of feeding tube placement EXAM: PORTABLE ABDOMEN - 1 VIEW COMPARISON:  06/01/2016, chest x-ray 06/08/2016 FINDINGS: Limited plain film of the abdomen. Weighted tip enteric feeding tube terminates in the third portion the duodenum. Gastric tube terminates within the stomach. Epicardial pacing leads project over the upper abdomen and lower chest. Partial visualization of Swan-Ganz catheter at the lower chest. 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 Gas within stomach, and small bowel. IMPRESSION: Weighted tip enteric feeding tube terminates within the third portion of duodenum. Gastric tube terminates within the stomach. Epicardial pacing  leads project over the upper abdomen, with surgical changes of median sternotomy and CABG. Swan-Ganz catheter partially visualized. Gas within stomach and small bowel. Signed, Dulcy Fanny. Earleen Newport, DO [facility] Electronically Signed   By: Corrie Mckusick D.O.   On: 06/08/2016 10:35   Dg Abd Portable 1v  Result Date: 06/01/2016 CLINICAL DATA:  OG tube placement EXAM: PORTABLE ABDOMEN - 1 VIEW COMPARISON:  None. FINDINGS: OG tube tip is in the mid stomach. Nonobstructive bowel gas pattern. IMPRESSION: OG tube tip in the mid stomach. Electronically Signed   By: Rolm Baptise M.D.   On: 06/01/2016 07:40   Anti-infectives: Anti-infectives    Start     Dose/Rate Route Frequency Ordered Stop   06/22/16 0800  cefUROXime (ZINACEF) 1.5 g in dextrose 5 % 50 mL IVPB     1.5 g 100 mL/hr over 30 Minutes Intravenous To ShortStay Surgical 06/20/16 1233 06/22/16 1040   06/08/16 1000  cefTRIAXone (ROCEPHIN) 2 g in dextrose 5 % 50 mL IVPB  Status:  Discontinued     2 g 100 mL/hr over 30 Minutes Intravenous Every 24 hours 06/07/16 0901 06/07/16 0903   06/07/16 1000  cefTRIAXone (ROCEPHIN) 2 g in dextrose 5 % 50 mL IVPB     2 g 100 mL/hr over 30 Minutes Intravenous Every 24 hours 06/07/16 0903 06/09/16 0938   06/07/16 0900  cefTRIAXone (ROCEPHIN) 2 g in dextrose 5 % 50 mL IVPB  Status:  Discontinued     2 g 100 mL/hr over 30 Minutes Intravenous Every 24 hours 06/07/16 0857 06/07/16 0901   06/06/16 0600  vancomycin (VANCOCIN) IVPB 1000 mg/200 mL premix     1,000 mg 200 mL/hr over 60 Minutes Intravenous  Once 06/06/16 0027 06/06/16 0658   06/06/16 0200  cefUROXime (ZINACEF) 1.5 g in dextrose 5 % 50 mL IVPB  Status:  Discontinued     1.5 g 100 mL/hr over 30 Minutes Intravenous Every 12 hours 06/06/16 0027 06/07/16 0857   06/05/16 2200  piperacillin-tazobactam (ZOSYN) IVPB 3.375 g  Status:  Discontinued  3.375 g 12.5 mL/hr over 240 Minutes Intravenous Every  12 hours 06/05/16 0718 06/05/16 2203   06/05/16 0400  vancomycin (VANCOCIN) 1,250 mg in sodium chloride 0.9 % 250 mL IVPB     1,250 mg 166.7 mL/hr over 90 Minutes Intravenous To Surgery 06/04/16 1300 06/05/16 1830   06/05/16 0400  cefUROXime (ZINACEF) 1.5 g in dextrose 5 % 50 mL IVPB     1.5 g 100 mL/hr over 30 Minutes Intravenous To Surgery 06/04/16 1300 06/05/16 2235   06/05/16 0400  cefUROXime (ZINACEF) 750 mg in dextrose 5 % 50 mL IVPB  Status:  Discontinued     750 mg 100 mL/hr over 30 Minutes Intravenous To Surgery 06/04/16 1300 06/05/16 2338   06/03/16 1000  vancomycin (VANCOCIN) IVPB 750 mg/150 ml premix  Status:  Discontinued     750 mg 150 mL/hr over 60 Minutes Intravenous Every 24 hours 06/02/16 1117 06/04/16 0859   06/02/16 0830  vancomycin (VANCOCIN) 1,250 mg in sodium chloride 0.9 % 250 mL IVPB     1,250 mg 166.7 mL/hr over 90 Minutes Intravenous  Once 06/02/16 0748 06/02/16 1205   06/02/16 0800  piperacillin-tazobactam (ZOSYN) IVPB 3.375 g  Status:  Discontinued     3.375 g 12.5 mL/hr over 240 Minutes Intravenous Every 8 hours 06/02/16 0748 06/05/16 0718      Assessment/Plan: s/p Procedure(s): ARTERIOVENOUS (AV) FISTULA CREATION LEFT UPPER ARM (Left) Stable postop day #1. Will see in the office in one month. Will not follow actively in the hospital. Please call if we can assist   LOS: 23 days   Panfilo Ketchum 06/23/2016, 6:57 AM

## 2016-06-23 NOTE — Progress Notes (Signed)
Chest tube sutures have been removed per MD order. Steri strips have been applied. No drainage noted. Will continue plan of care.  Grant Fontana BSN, RN

## 2016-06-23 NOTE — Progress Notes (Signed)
      [address]       [place] [address]             [phone]      1 Day Post-Op Procedure(s) (LRB): ARTERIOVENOUS (AV) FISTULA CREATION LEFT UPPER ARM (Left) Subjective: Shares that he is having a lot of pain in his arm. He did not sleep well due to the pain.   Objective: Vital signs in last 24 hours: Temp:  [97 F (36.1 C)-98.8 F (37.1 C)] 98.8 F (37.1 C) (10/10 0554) Pulse Rate:  [60-72] 63 (10/10 0554) Cardiac Rhythm: Normal sinus rhythm (10/09 1905) Resp:  [12-17] 17 (10/10 0554) BP: (79-121)/(42-64) 121/42 (10/10 0554) SpO2:  [92 %-99 %] 96 % (10/10 0554) Weight:  [131 lb 4.8 oz (59.6 kg)-133 lb 6.1 oz (60.5 kg)] 131 lb 4.8 oz (59.6 kg) (10/10 0554)     Intake/Output from previous day: 10/09 0701 - 10/10 0700 In: 800 [P.O.:500; I.V.:300] Out: 20 [Blood:20] Intake/Output this shift: No intake/output data recorded.  General appearance: alert, cooperative and no distress Heart: regular rate and rhythm, S1, S2 normal, no murmur, click, rub or gallop Lungs: clear to auscultation bilaterally Abdomen: soft, non-tender; bowel sounds normal; no masses,  no organomegaly Extremities: extremities normal, atraumatic, no cyanosis or edema Wound: c/d/i without drainage  Lab Results:  Recent Labs  06/22/16 0214 06/23/16 0313  WBC 8.7 8.0  HGB 10.5* 10.9*  HCT 31.7* 35.0*  PLT 273 246   BMET:  Recent Labs  06/22/16 0214  NA 133*  K 4.1  CL 98*  CO2 26  GLUCOSE 132*  BUN 25*  CREATININE 4.08*  CALCIUM 8.3*    PT/INR: No results for input(s): LABPROT, INR in the last 72 hours. ABG    Component Value Date/Time   PHART 7.439 06/09/2016 1247   HCO3 26.3 06/09/2016 1247   TCO2 27 06/09/2016 1247   ACIDBASEDEF 3.0 (H) 06/09/2016 1118   O2SAT 70.0 06/16/2016 0437   CBG (last 3)   Recent Labs  06/22/16 1243 06/22/16 2055 06/23/16 0637  GLUCAP 133* 213* 104*    Assessment/Plan: S/P Procedure(s) (LRB): ARTERIOVENOUS (AV)  FISTULA CREATION LEFT UPPER ARM (Left)   1. CV - Previously A paced. Still with EPW but not on and HR on average in the low 60's.On Coreg 3.125 mg bid.  2.  Pulmonary - On room air.  Encourage incentive spirometer and flutter valve. 3. DM-CBGs 133, 213, 104. On Insulin and was increased. Pre op HGA1C 10.4. Will need close medical follow up after discharge. 4.  Acute blood loss anemia - H and H stable at 10.9 and 35. On Aranesp. 5. CKD-stage 4.Creatinine increased from 3.16 to 4.08. Nephrology following. Fistula palced yesterday 6. Depression-on Celexa   Plan: Home today if okay with renal and attending. His wife will not be able to come until 8pm to pick him up. Will require home health services.     LOS: 23 days     Elgie Collard 06/23/2016

## 2016-06-23 NOTE — Telephone Encounter (Signed)
Unable to LVM on home # mailing letter to home address  For app [place] & F/U 11/14

## 2016-06-23 NOTE — Progress Notes (Signed)
Pt has been discharged home with family. IV and telemetry box removed. Pt's subclavian dialysis port remained in place. Pt received discharge instructions with translator and information was transcribed on pt's discharge handout by a family friend. All questions were answered. Pt left the floor via wheelchair and was accompanied by pt's family, pt's friends, this Therapist, sports, and a Chartered certified accountant. Pt was in no distress at time of discharge.   Grant Fontana BSN, RN

## 2016-06-23 NOTE — Progress Notes (Signed)
Speech Language Pathology Treatment: Dysphagia  Patient Details Name: Kenneth [name] MRN: [id number] DOB: November 07, 1955 Today's Date: 06/23/2016 Time: 6759-1638 SLP Time Calculation (min) (ACUTE ONLY): 10 min  Assessment / Plan / Recommendation Clinical Impression  RN reports that pt has been using a straw despite SLP precautions to drink from cup only. He remains afebrile with lung sounds clear. SLP provided skilled observation during lunch meal as pt consumed soft solids and thin liquids via straw. No overt signs of aspiration noted, although he did appear to have some piecemealing with larger sips of thin liquid. Recommend to continue with current diet, straws okay.   HPI HPI: Pt is 60 y.o.male with hx of poorly controlled diabetes, HTN, CKD IV, and HLD. Admitted 05/31/16 with orthopnea, DOE, and CP. EKG did not meet stemi criteria. Troponin 1.4. Echo showed global LV dysfunction. Started on NIMV for acute respiratory failure, failed requiring intubation on 9/17 and was extubated on 9/26.      SLP Plan  Continue with current plan of care     Recommendations  Diet recommendations: Regular;Thin liquid Liquids provided via: Cup;Straw Medication Administration: Whole meds with puree Supervision: Patient able to self feed;Intermittent supervision to cue for compensatory strategies Compensations: Slow rate;Small sips/bites Postural Changes and/or Swallow Maneuvers: Seated upright 90 degrees                Oral Care Recommendations: Oral care BID Follow up Recommendations: 24 hour supervision/assistance Plan: Continue with current plan of care       GO                Germain Osgood 06/23/2016, 12:23 PM  Germain Osgood, M.A. CCC-SLP [phone]

## 2016-06-23 NOTE — Discharge Instructions (Signed)
Coronary Artery Bypass Grafting, Care After °Refer to this sheet in the next few weeks. These instructions provide you with information on caring for yourself after your procedure. Your health care provider may also give you more specific instructions. Your treatment has been planned according to current medical practices, but problems sometimes occur. Call your health care provider if you have any problems or questions after your procedure. °WHAT TO EXPECT AFTER THE PROCEDURE °Recovery from surgery will be different for everyone. Some people feel well after 3 or 4 weeks, while for others it takes longer. After your procedure, it is typical to have the following: °· Nausea and a lack of appetite.   °· Constipation. °· Weakness and fatigue.   °· Depression or irritability.   °· Pain or discomfort at your incision site. °HOME CARE INSTRUCTIONS °· Take medicines only as directed by your health care provider. Do not stop taking medicines or start any new medicines without first checking with your health care provider. °· Take your pulse as directed by your health care provider. °· Perform deep breathing as directed by your health care provider. If you were given a device called an incentive spirometer, use it to practice deep breathing several times a day. Support your chest with a pillow or your arms when you take deep breaths or cough. °· Keep incision areas clean, dry, and protected. Remove or change any bandages (dressings) only as directed by your health care provider. You may have skin adhesive strips over the incision areas. Do not take the strips off. They will fall off on their own. °· Check incision areas daily for any swelling, redness, or drainage. °· If incisions were made in your legs, do the following: °¨ Avoid crossing your legs.   °¨ Avoid sitting for long periods of time. Change positions every 30 minutes.   °¨ Elevate your legs when you are sitting. °· Wear compression stockings as directed by your  health care provider. These stockings help keep blood clots from forming in your legs. °· Take showers once your health care provider approves. Until then, only take sponge baths. Pat incisions dry. Do not rub incisions with a washcloth or towel. Do not take baths, swim, or use a hot tub until your health care provider approves. °· Eat foods that are high in fiber, such as raw fruits and vegetables, whole grains, beans, and nuts. Meats should be lean cut. Avoid canned, processed, and fried foods. °· Drink enough fluid to keep your urine clear or pale yellow. °· Weigh yourself every day. This helps identify if you are retaining fluid that may make your heart and lungs work harder. °· Rest and limit activity as directed by your health care provider. You may be instructed to: °¨ Stop any activity at once if you have chest pain, shortness of breath, irregular heartbeats, or dizziness. Get help right away if you have any of these symptoms. °¨ Move around frequently for short periods or take short walks as directed by your health care provider. Increase your activities gradually. You may need physical therapy or cardiac rehabilitation to help strengthen your muscles and build your endurance. °¨ Avoid lifting, pushing, or pulling anything heavier than 10 lb (4.5 kg) for at least 6 weeks after surgery. °· Do not drive until your health care provider approves.  °· Ask your health care provider when you may return to work. °· Ask your health care provider when you may resume sexual activity. °· Keep all follow-up visits as directed by your health care   provider. This is important. °SEEK MEDICAL CARE IF: °· You have swelling, redness, increasing pain, or drainage at the site of an incision. °· You have a fever. °· You have swelling in your ankles or legs. °· You have pain in your legs.   °· You gain 2 or more pounds (0.9 kg) a day. °· You are nauseous or vomit. °· You have diarrhea.  °SEEK IMMEDIATE MEDICAL CARE IF: °· You have  chest pain that goes to your jaw or arms. °· You have shortness of breath.   °· You have a fast or irregular heartbeat.   °· You notice a "clicking" in your breastbone (sternum) when you move.   °· You have numbness or weakness in your arms or legs. °· You feel dizzy or light-headed.   °MAKE SURE YOU: °· Understand these instructions. °· Will watch your condition. °· Will get help right away if you are not doing well or get worse. °  °This information is not intended to replace advice given to you by your health care provider. Make sure you discuss any questions you have with your health care provider. °  °Document Released: 03/20/2005 Document Revised: 09/21/2014 Document Reviewed: 02/07/2013 °Elsevier Interactive Patient Education ©2016 Elsevier Inc[employer] ° °Endoscopic Saphenous Vein Harvesting, Care After °Refer to this sheet in the next few weeks. These instructions provide you with information on caring for yourself after your procedure. Your health care provider may also give you more specific instructions. Your treatment has been planned according to current medical practices, but problems sometimes occur. Call your health care provider if you have any problems or questions after your procedure. °HOME CARE INSTRUCTIONS °Medicine °· Take whatever pain medicine your surgeon prescribes. Follow the directions carefully. Do not take over-the-counter pain medicine unless your surgeon says it is okay. Some pain medicine can cause bleeding problems for several weeks after surgery. °· Follow your surgeon's instructions about driving. You will probably not be permitted to drive after heart surgery. °· Take any medicines your surgeon prescribes. Any medicines you took before your heart surgery should be checked with your health care provider before you start taking them again. °Wound care °· If your surgeon has prescribed an elastic bandage or stocking, ask how long you should wear it. °· Check the area around your surgical  cuts (incisions) whenever your bandages (dressings) are changed. Look for any redness or swelling. °· You will need to return to have the stitches (sutures) or staples taken out. Ask your surgeon when to do that. °· Ask your surgeon when you can shower or bathe. °Activity °· Try to keep your legs raised when you are sitting. °· Do any exercises your health care providers have given you. These may include deep breathing exercises, coughing, walking, or other exercises. °SEEK MEDICAL CARE IF: °· You have any questions about your medicines. °· You have more leg pain, especially if your pain medicine stops working. °· New or growing bruises develop on your leg. °· Your leg swells, feels tight, or becomes red. °· You have numbness in your leg. °SEEK IMMEDIATE MEDICAL CARE IF: °· Your pain gets much worse. °· Blood or fluid leaks from any of the incisions. °· Your incisions become warm, swollen, or red. °· You have chest pain. °· You have trouble breathing. °· You have a fever. °· You have more pain near your leg incision. °MAKE SURE YOU: °· Understand these instructions. °· Will watch your condition. °· Will get help right away if you are not doing well or   get worse. °  °This information is not intended to replace advice given to you by your health care provider. Make sure you discuss any questions you have with your health care provider. °  °Document Released: 05/13/2011 Document Revised: 09/21/2014 Document Reviewed: 05/13/2011 °Elsevier Interactive Patient Education ©2016 Elsevier Inc. ° ° °

## 2016-06-23 NOTE — Progress Notes (Signed)
Occupational Therapy Treatment Patient Details Name: Kenneth [name] MRN: [id number] DOB: September 23, 1955 Today's Date: 06/23/2016    History of present illness Pt adm with SOB and required emergent intubation and cardiac cath. Underwent CABG on 06/05/16. Requiring CRRT post op, discontinued 9/29. PMH - DM, CKD, HTN   OT comments  Informed by PT that pt had some dizziness with them and they had to get rollator for him to use. BP's taken: Pt reclined (feet up, back up 90/45),  standing at recliner post 1 minute (73/43 and pt swaying, reported he only felt a little dizzy),  sitting on rollator as he ambulated to nursing station and said he felt dizzy (85/54),  sitting in recliner after ambulating back to room (97/46)  Making progress towards goals, still recommend 24 hour S and CIR changed to [place].   Follow Up Recommendations  Home health OT;Supervision/Assistance - 24 hour    Equipment Recommendations  3 in 1 bedside comode       Precautions / Restrictions Precautions Precautions: Fall;Sternal Restrictions Weight Bearing Restrictions: Yes Other Position/Activity Restrictions: sternal--pt stated he did not know any of them (I reviewed them with he and his mother using words and motions)       Mobility Bed Mobility               General bed mobility comments: pt up in recliner upon arrival  Transfers Overall transfer level: Needs assistance Equipment used: Rolling walker (2 wheeled) Transfers: Sit to/from Stand   Stand pivot transfers: Supervision       General transfer comment: cues for hand placement and safety--to cross hands over chest for sit<>stand        ADL Overall ADL's : Needs assistance/impaired                     Lower Body Dressing: Supervision/safety;Set up;Sit to/from stand   Toilet Transfer: Min guard;RW;Ambulation                              Cognition   Behavior During Therapy: Flat affect Overall Cognitive Status:  Impaired/Different from baseline       Memory: Decreased recall of precautions;Decreased short-term memory          General Comments: Pt unable to state any of his sternal precautions                 Pertinent Vitals/ Pain       Pain Assessment: No/denies pain Faces Pain Scale: No hurt         Frequency  Min 2X/week        Progress Toward Goals  OT Goals(current goals can now be found in the care plan section)  Progress towards OT goals: Progressing toward goals     Plan Discharge plan needs to be updated       End of Session Equipment Utilized During Treatment: Rolling walker   Activity Tolerance  (limited by intermittent dizziness)   Patient Left in chair;with call bell/phone within reach;with family/visitor present   Nurse Communication  (BP readings)        Time: 6294-7654 OT Time Calculation (min): 34 min  Charges: OT General Charges $OT Visit: 1 Procedure OT Treatments $Self Care/Home Management : 23-37 mins  Almon Register [phone] 06/23/2016, 2:54 PM

## 2016-06-23 NOTE — Care Management Note (Addendum)
Case Management Note Previous CM note initiated by Maryclare Labrador, RN 06/16/2016, 8:58 AM    Patient Details  Name: [name] MRN: [id number] Date of Birth: 16-Jul-1956  Subjective/Objective:     Pt s/p CABG   - currently on CRRT               Action/Plan:  Pt is from home with wife.  Recommendations for CIR - CSW consulted for back up plan    Expected Discharge Date:    06/23/16              Expected Discharge Plan:  [place]  In-House Referral:     Discharge planning Services  CM Consult  Post Acute Care Choice:  Home Health Choice offered to:  Patient  DME Arranged:   9M4 DME Agency:    [place]:    RN/PT/OT [place]:   [place]  Status of Service:  Completed, signed off  If discussed at [place] of Stay Meetings, dates discussed:  10/5 10/10  Discharge Disposition: home with home health  Additional Comments:  06/23/16- 1130- Valentina Gu, CM- pt for d/c home today- order placed for [facility] - in to speak with pt at bedside- choice offered for [facility] agency in [facility]- per pt he does not have a preference- is ok using the agency affiliated with hospital - [facility]- referral called to Santiago Glad with [facility] for [employer]- post CABG-  Pt has been clipped for outpt HD at [facility]- M/W/F Update-1520- per MD have added PT/OT and 3n1 to d/c arrangements- call made to [facility] with [facility] for DME needs- 3n1 to be delivered to room prior to discharge, and notified Butch Penny to add PT/OT to [facility] orders.   06/22/16- 1500 - Daney Moor RN, BSN- referral for [facility]- went by to see pt- pt in HD - will f/u with pt regarding [place] arrangements and HH choice- in am prior to discharge.   Discussed in LOS 06/16/16:  Pt remains appropriate for continued stay 3 Days Post-Op Procedure(s) (LRB): INSERTION OF DIALYSIS CATHETER RIGHT INTERNAL JUGULAR (Right) CXR small L effusion,  Needs A-pacing for sinus brady, transfer orders for stepdown written.  Last PT  eval recommends HH - PT ordered to re-eval today -  CM will continue to follow for discharge needs    Dawayne Patricia, RN 06/23/2016, 12:02 PM [phone]

## 2016-06-27 DIAGNOSIS — Z48812 Encounter for surgical aftercare following surgery on the circulatory system: Secondary | ICD-10-CM | POA: Diagnosis not present

## 2016-06-27 DIAGNOSIS — Z951 Presence of aortocoronary bypass graft: Secondary | ICD-10-CM | POA: Diagnosis not present

## 2016-06-30 LAB — VAS US DOPPLER PRE CABG
LEFT ECA DIAS: -1 cm/s
LEFT VERTEBRAL DIAS: -20 cm/s
Left CCA dist dias: -2 cm/s
Left CCA dist sys: -128 cm/s
Left CCA prox sys: 156 cm/s
Left ICA dist dias: -20 cm/s
Left ICA dist sys: -110 cm/s
Left ICA prox dias: -14 cm/s
Left ICA prox sys: -131 cm/s

## 2016-07-08 ENCOUNTER — Encounter: Payer: BLUE CROSS/BLUE SHIELD | Admitting: Physician Assistant

## 2016-07-09 ENCOUNTER — Ambulatory Visit (INDEPENDENT_AMBULATORY_CARE_PROVIDER_SITE_OTHER): Payer: BLUE CROSS/BLUE SHIELD | Admitting: Cardiology

## 2016-07-09 ENCOUNTER — Encounter: Payer: Self-pay | Admitting: Cardiology

## 2016-07-09 VITALS — BP 154/70 | HR 69 | Ht 66.0 in | Wt 135.8 lb

## 2016-07-09 DIAGNOSIS — I214 Non-ST elevation (NSTEMI) myocardial infarction: Secondary | ICD-10-CM | POA: Diagnosis not present

## 2016-07-09 DIAGNOSIS — Z79899 Other long term (current) drug therapy: Secondary | ICD-10-CM | POA: Diagnosis not present

## 2016-07-09 DIAGNOSIS — E784 Other hyperlipidemia: Secondary | ICD-10-CM

## 2016-07-09 DIAGNOSIS — E7849 Other hyperlipidemia: Secondary | ICD-10-CM

## 2016-07-09 MED ORDER — CARVEDILOL 3.125 MG PO TABS: 3.1250 mg | ORAL_TABLET | Freq: Two times a day (BID) | ORAL | 6 refills | Status: DC

## 2016-07-09 NOTE — Patient Instructions (Addendum)
Your physician recommends that you continue on your current medications as directed. Please refer to the Current Medication list given to you today. MAKE  SURE  YOU TAKE CARVEDILOL  3.125 MG  TWICE DAILY   Your physician recommends that you return for lab work in:  [place]   [place] physician recommends that you schedule a follow-up appointment in: [place]

## 2016-07-09 NOTE — Progress Notes (Signed)
07/09/2016 Kenneth [name]   November 09, 1955  [id number]  Primary Physician Rodena Medin, MD Primary Cardiologist: Dr. Aundra Dubin    Reason for Visit/CC: [facility] F/u for CAD and Systolic CHF  HPI:  60 y/o Asian male, presenting to clinic today for post hospital f/u. He has a h/o poorly controlled IDDM, HTN, HLD, ESRD, newly diagnosed CAD and systolic HF. He recently had a prolonged hospitalization from 9/17- 10/10. He initially presented with CP and dyspnea. He developed ARF/ cardiogenic shock requiring intubation and implantation of an IABP. Troponin was elevated ruling him in for NSTEMI. 2D echo showed reduced EF down to 35-40%. Subsequent LHC showed multivessel CAD. Once stabilized, he underwent CABG x 3 per Dr. Nils Pyle on 06/05/16 w/ LIMA-LAD, SVG-OM, SVG-RCA. He had post operative atrial fibrillation and required amiodarone. Also with worsening renal function and was placed on HD. He now goes 3 x week on MWF.   Today in f/u, he notes that he has done fairly well. He denies resting symptoms but notes occasional mild chest tightness with certain exertional activities, that resolves with rest. He denies LEE, orthopnea, PND. No palpitations. EKG shows NSR with ST abnormalities in leads I,II, V4-V6, unchanged from prior. His BP is 154/70. Meds were reviewed. It appears that he has only been taking his Coreg once daily. HR is 69 bpm. He is on 3.125 mg. He denies tobacco use.   Current Meds  Medication Sig  . albuterol (PROVENTIL HFA;VENTOLIN HFA) 108 (90 Base) MCG/ACT inhaler Inhale 2 puffs into the lungs every 6 (six) hours as needed for wheezing.  Marland Kitchen aspirin EC 325 MG EC tablet Take 1 tablet (325 mg total) by mouth daily.  Marland Kitchen atorvastatin (LIPITOR) 80 MG tablet Take 1 tablet (80 mg total) by mouth daily at 6 PM.  . carvedilol (COREG) 3.125 MG tablet Take 1 tablet (3.125 mg total) by mouth daily with breakfast.  . citalopram (CELEXA) 20 MG tablet Take 1 tablet (20 mg total) by mouth daily.  .  Darbepoetin Alfa (ARANESP) 100 MCG/0.5ML SOSY injection Inject 0.5 mLs (100 mcg total) into the vein every Saturday with hemodialysis.  . Insulin Human (INSULIN PUMP) SOLN Inject into the skin continuous. Novolog  . Multiple Vitamin (MULTIVITAMIN WITH MINERALS) TABS tablet Take 1 tablet by mouth at bedtime.  Marland Kitchen oxyCODONE (OXY IR/ROXICODONE) 5 MG immediate release tablet Take 1 tablet (5 mg total) by mouth every 6 (six) hours as needed for severe pain.   Allergies  Allergen Reactions  . Hydralazine Rash   Past Medical History:  Diagnosis Date  . Acute pulmonary edema ([place]) 05/31/2016  . Acute respiratory failure with hypoxemia ([place]) 05/31/2016  . AKI (acute kidney injury) ([place])   . CHF (congestive heart failure) ([place])   . 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[place]) 02/06/2015  . HCAP (healthcare-associated pneumonia)   . Ischemic rest pain of lower extremity ([place]) 02/06/2015  . 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 NSTEMI (non-ST elevated myocardial infarction) ([place])   . Pain in lower limb 02/06/2015  . Pain in the chest   . Pleural effusion   . 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 Shortness of breath    No family history on file. Past Surgical History:  Procedure Laterality Date  . AV FISTULA PLACEMENT Left 06/22/2016   Procedure: ARTERIOVENOUS (AV) FISTULA CREATION LEFT UPPER ARM;  Surgeon: Rosetta Posner, MD;  Location: [place];  Service: Vascular;  Laterality: Left;  . CARDIAC CATHETERIZATION N/A 05/31/2016  Procedure: Left Heart Cath and Coronary Angiography;  Surgeon: Jettie Booze, MD;  Location: [place] CV LAB;  Service: Cardiovascular;  Laterality: N/A;  . CARDIAC CATHETERIZATION N/A 05/31/2016   Procedure: Right Heart Cath;  Surgeon: Jettie Booze, MD;  Location: [place] CV LAB;  Service: Cardiovascular;  Laterality: N/A;  . CARDIAC CATHETERIZATION N/A 05/31/2016   Procedure: IABP Insertion;  Surgeon: Jettie Booze, MD;  Location: [place]  CV LAB;  Service: Cardiovascular;  Laterality: N/A;  . CORONARY ARTERY BYPASS GRAFT N/A 06/05/2016   Procedure: CORONARY ARTERY BYPASS GRAFTING (CABG) x3 LIMA to LAD -SVG to OM -SVG to RCA;  Surgeon: Ivin Poot, MD;  Location: [place];  Service: Open Heart Surgery;  Laterality: N/A;  . INSERTION OF DIALYSIS CATHETER N/A 06/05/2016   Procedure: INSERTION OF DIALYSIS/trialysis CATHETER;  Surgeon: Ivin Poot, MD;  Location: [place];  Service: Vascular;  Laterality: N/A;  . INSERTION OF DIALYSIS CATHETER Right 06/13/2016   Procedure: INSERTION OF DIALYSIS CATHETER RIGHT INTERNAL JUGULAR;  Surgeon: Conrad Rumson, MD;  Location: [place];  Service: Vascular;  Laterality: Right;  . INTRAOPERATIVE TRANSESOPHAGEAL ECHOCARDIOGRAM N/A 06/05/2016   Procedure: INTRAOPERATIVE TRANSESOPHAGEAL ECHOCARDIOGRAM;  Surgeon: Ivin Poot, MD;  Location: [place];  Service: Open Heart Surgery;  Laterality: N/A;   Social History   Social History  . Marital status: Unknown    Spouse name: N/A  . Number of children: N/A  . Years of education: N/A   Occupational History  . Not on file.   Social History Main Topics  . Smoking status: Former Research scientist (life sciences)  . Smokeless tobacco: Never Used  . Alcohol use Not on file  . Drug use: Unknown  . Sexual activity: Not on file   Other Topics Concern  . Not on file   Social History Narrative  . No narrative on file     Review of Systems: General: negative for chills, fever, night sweats or weight changes.  Cardiovascular: negative for chest pain, dyspnea on exertion, edema, orthopnea, palpitations, paroxysmal nocturnal dyspnea or shortness of breath Dermatological: negative for rash Respiratory: negative for cough or wheezing Urologic: negative for hematuria Abdominal: negative for nausea, vomiting, diarrhea, bright red blood per rectum, melena, or hematemesis Neurologic: negative for visual changes, syncope, or dizziness All other systems reviewed and are otherwise  negative except as noted above.   Physical Exam:  Blood pressure (!) 154/70, pulse 69, height 5\' 6"  (1.676 m), weight 135 lb 12.8 oz (61.6 kg).  General appearance: alert, cooperative, no distress and thin appearing Neck: no carotid bruit and no JVD Lungs: clear to auscultation bilaterally Heart: regular rate and rhythm, S1, S2 normal, no murmur, click, rub or gallop Extremities: no LEE Pulses: 2+ and symmetric Skin: warm and dry Neurologic: Grossly normal  EKG NSR. with ST abnormalities in leads I,II, V4-V6, unchanged from prior.  ASSESSMENT AND PLAN:   1. CAD: s/p CABG x 3 per Dr. Nils Pyle on 06/05/16 w/ LIMA-LAD, SVG-OM, SVG-RCA. EF 35-40%. He denies resting symptoms but notes occasional mild chest tightness with certain exertional activities, that resolves with rest. EKG unchanged from prior. Meds Reviewed. It appears he has only been taking his Coreg once daily. His Bp is in the 947S systolic. Pulse rate is 69 bpm. We will increase Coreg to BID. Continue at dose of 3.125 mg. Hopefully this will help with BP + added antianginal benefit. Continue ASA and statin. No ACE/ARB due to CKD. He has f/u with  Dr. Prescott Gum on 10/31.   2. Chronic Systolic HF: EF 64-29% pre CABG. Hopefully EF will improve now that he has undergone surgical revascularization. He is euvolemic on exam. No dyspnea. Volume is controlled through HD. No ACE/ARB given ESRD. We will increase his Coreg from once daily to BID. His BP will not tolerate addition of nitrate/hydralazine. Low sodium diet advised.   3. Post-operative Atrial Fibrillation: he required amiodarone post op but this was not discontinued on discharge. He is in NSR. He denies symptoms of breakthrough afib. He is on BB therapy with HR in the upper 60s.   4. ESRD: on HD MWF  5. IDDM: followed by PCP.  6. HTN: moderately elevated. Increase Coreg to BID dosing.   7. HLD: on high intensity statin therapy with Lipitor 80 mg. Check FLP and HFTs in 4 weeks.     PLAN  F/u with Dr. Aundra Dubin in 3-4 weeks.   Lyda Jester PA-C 07/09/2016 3:04 PM

## 2016-07-13 ENCOUNTER — Other Ambulatory Visit: Payer: Self-pay | Admitting: Cardiothoracic Surgery

## 2016-07-13 DIAGNOSIS — Z951 Presence of aortocoronary bypass graft: Secondary | ICD-10-CM

## 2016-07-13 NOTE — Progress Notes (Unsigned)
cxr 

## 2016-07-14 ENCOUNTER — Ambulatory Visit
Admission: RE | Admit: 2016-07-14 | Discharge: 2016-07-14 | Disposition: A | Discharge status: Home / Self Care | Payer: BLUE CROSS/BLUE SHIELD | Source: Ambulatory Visit | Attending: Cardiothoracic Surgery | Admitting: Cardiothoracic Surgery

## 2016-07-14 ENCOUNTER — Encounter: Payer: Self-pay | Admitting: Cardiothoracic Surgery

## 2016-07-14 ENCOUNTER — Ambulatory Visit (INDEPENDENT_AMBULATORY_CARE_PROVIDER_SITE_OTHER): Payer: Self-pay | Admitting: Cardiothoracic Surgery

## 2016-07-14 VITALS — BP 120/67 | HR 62 | Resp 18 | Ht 66.0 in | Wt 135.0 lb

## 2016-07-14 DIAGNOSIS — Z951 Presence of aortocoronary bypass graft: Secondary | ICD-10-CM

## 2016-07-14 NOTE — Progress Notes (Signed)
PCP is Rodena Medin, MD Referring Provider is Jettie Booze, MD  Chief Complaint  Patient presents with  . Routine Post Op    F/U from surgery with CXR, s/p CABG x3, 06/05/16    HPI: Patient returns for follow-up 1 month after urgent multivessel CABG following acute MI, cardiogenic shock, preoperative balloon pump, and acute renal insufficiency. The patient recover from surgery but required long-term dialysis and had a diatek tunneled catheter placed prior to discharge. He is dialyzed Monday Wednesday Friday. He is making urine. He denies recurrent angina symptoms of CHF. Chest x-ray today is clear. Surgical incisions are healing well. There is no peripheral edema. He is taking aspirin, Lipitor, and carvedilol for his heart. He is also diabetic and is on a insulin pump.  Past Medical History:  Diagnosis Date  . Acute pulmonary edema ([place]) 05/31/2016  . Acute respiratory failure with hypoxemia ([place]) 05/31/2016  . AKI (acute kidney injury) ([place])   . CHF (congestive heart failure) ([place])   . 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 HCAP (healthcare-associated pneumonia)   . Ischemic rest pain of lower extremity ([place]) 02/06/2015  . 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 NSTEMI (non-ST elevated myocardial infarction) ([place])   . Pain in lower limb 02/06/2015  . Pain in the chest   . Pleural effusion   . 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 Shortness of breath     Past Surgical History:  Procedure Laterality Date  . AV FISTULA PLACEMENT Left 06/22/2016   Procedure: ARTERIOVENOUS (AV) FISTULA CREATION LEFT UPPER ARM;  Surgeon: Rosetta Posner, MD;  Location: [place];  Service: Vascular;  Laterality: Left;  . CARDIAC CATHETERIZATION N/A 05/31/2016   Procedure: Left Heart Cath and Coronary Angiography;  Surgeon: Jettie Booze, MD;  Location: [employer] CV LAB;  Service: Cardiovascular;  Laterality: N/A;  . CARDIAC CATHETERIZATION N/A 05/31/2016   Procedure:  Right Heart Cath;  Surgeon: Jettie Booze, MD;  Location: [place] CV LAB;  Service: Cardiovascular;  Laterality: N/A;  . CARDIAC CATHETERIZATION N/A 05/31/2016   Procedure: IABP Insertion;  Surgeon: Jettie Booze, MD;  Location: [place] CV LAB;  Service: Cardiovascular;  Laterality: N/A;  . CORONARY ARTERY BYPASS GRAFT N/A 06/05/2016   Procedure: CORONARY ARTERY BYPASS GRAFTING (CABG) x3 LIMA to LAD -SVG to OM -SVG to RCA;  Surgeon: Ivin Poot, MD;  Location: [place];  Service: Open Heart Surgery;  Laterality: N/A;  . INSERTION OF DIALYSIS CATHETER N/A 06/05/2016   Procedure: INSERTION OF DIALYSIS/trialysis CATHETER;  Surgeon: Ivin Poot, MD;  Location: [place];  Service: Vascular;  Laterality: N/A;  . INSERTION OF DIALYSIS CATHETER Right 06/13/2016   Procedure: INSERTION OF DIALYSIS CATHETER RIGHT INTERNAL JUGULAR;  Surgeon: Conrad Akron, MD;  Location: [place];  Service: Vascular;  Laterality: Right;  . INTRAOPERATIVE TRANSESOPHAGEAL ECHOCARDIOGRAM N/A 06/05/2016   Procedure: INTRAOPERATIVE TRANSESOPHAGEAL ECHOCARDIOGRAM;  Surgeon: Ivin Poot, MD;  Location: [place];  Service: Open Heart Surgery;  Laterality: N/A;    No family history on file.  Social History Social History  Substance Use Topics  . Smoking status: Former Research scientist (life sciences)  . Smokeless tobacco: Never Used  . Alcohol use Not on file    Current Outpatient Prescriptions  Medication Sig Dispense Refill  . albuterol (PROVENTIL HFA;VENTOLIN HFA) 108 (90 Base) MCG/ACT inhaler Inhale 2 puffs into the lungs every 6 (six) hours as needed for wheezing.    Marland Kitchen aspirin EC 325  MG EC tablet Take 1 tablet (325 mg total) by mouth daily. 30 tablet 0  . atorvastatin (LIPITOR) 80 MG tablet Take 1 tablet (80 mg total) by mouth daily at 6 PM. 30 tablet 1  . carvedilol (COREG) 3.125 MG tablet Take 1 tablet (3.125 mg total) by mouth 2 (two) times daily with a meal. 60 tablet 6  . citalopram (CELEXA) 20 MG tablet Take 1 tablet (20 mg  total) by mouth daily. 30 tablet 1  . Darbepoetin Alfa (ARANESP) 100 MCG/0.5ML SOSY injection Inject 0.5 mLs (100 mcg total) into the vein every Saturday with hemodialysis. 4.2 mL   . Insulin Human (INSULIN PUMP) SOLN Inject into the skin continuous. Novolog    . Multiple Vitamin (MULTIVITAMIN WITH MINERALS) TABS tablet Take 1 tablet by mouth at bedtime.    Marland Kitchen oxyCODONE (OXY IR/ROXICODONE) 5 MG immediate release tablet Take 1 tablet (5 mg total) by mouth every 6 (six) hours as needed for severe pain. 30 tablet 0   No current facility-administered medications for this visit.     Allergies  Allergen Reactions  . Hydralazine Rash    Review of Systems  Feels well Walks daily No angina or orthopnea or PND or ankle edema BP 120/67   Pulse 62   Resp 18   Ht 5\' 6"  (1.676 m)   Wt 135 lb (61.2 kg)   SpO2 97% Comment: RA  BMI 21.79 kg/m  Physical Exam .      Exam    General- alert and comfortable   Lungs- clear without rales, wheezes   Cor- regular rate and rhythm, no murmur , gallop   Abdomen- soft, non-tender   Extremities - warm, non-tender, minimal edema   Neuro- oriented, appropriate, no focal weakness    Diagnostic Tests: Chest x-ray today's clear  Impression: Doing well after urgent multivessel CABG for acute MI with cardiogenic shock. He remains dialysis dependent Chest x-ray is clear His incisions are healed He is able to drive and do  normal daily activity but now lift more than 20 pounds until January 1. He understands he should remain on carvedilol aspirin and Lipitor indefinitely. He will return as needed.  Plan:   Len Childs, MD [facility] [phone]

## 2016-07-15 ENCOUNTER — Ambulatory Visit: Payer: BLUE CROSS/BLUE SHIELD | Admitting: Cardiothoracic Surgery

## 2016-07-22 ENCOUNTER — Encounter: Payer: Self-pay | Admitting: Vascular Surgery

## 2016-07-24 ENCOUNTER — Encounter: Payer: Self-pay | Admitting: Vascular Surgery

## 2016-07-28 ENCOUNTER — Ambulatory Visit: Payer: BLUE CROSS/BLUE SHIELD | Admitting: Vascular Surgery

## 2016-07-28 ENCOUNTER — Ambulatory Visit (HOSPITAL_COMMUNITY): Admit: 2016-07-28 | Payer: BLUE CROSS/BLUE SHIELD

## 2016-07-29 ENCOUNTER — Encounter: Payer: Self-pay | Admitting: Family

## 2016-08-04 ENCOUNTER — Other Ambulatory Visit: Payer: BLUE CROSS/BLUE SHIELD | Admitting: *Deleted

## 2016-08-04 ENCOUNTER — Ambulatory Visit (INDEPENDENT_AMBULATORY_CARE_PROVIDER_SITE_OTHER): Payer: Self-pay | Admitting: Vascular Surgery

## 2016-08-04 ENCOUNTER — Ambulatory Visit (HOSPITAL_COMMUNITY)
Admission: RE | Admit: 2016-08-04 | Discharge: 2016-08-04 | Disposition: A | Discharge status: Home / Self Care | Payer: BLUE CROSS/BLUE SHIELD | Source: Ambulatory Visit | Attending: Vascular Surgery | Admitting: Vascular Surgery

## 2016-08-04 ENCOUNTER — Encounter: Payer: Self-pay | Admitting: Vascular Surgery

## 2016-08-04 VITALS — BP 131/54 | HR 64 | Temp 97.3°F | Resp 16 | Ht 66.0 in | Wt 132.0 lb

## 2016-08-04 DIAGNOSIS — Z48812 Encounter for surgical aftercare following surgery on the circulatory system: Secondary | ICD-10-CM

## 2016-08-04 DIAGNOSIS — Z992 Dependence on renal dialysis: Secondary | ICD-10-CM

## 2016-08-04 DIAGNOSIS — N186 End stage renal disease: Secondary | ICD-10-CM

## 2016-08-04 DIAGNOSIS — E7849 Other hyperlipidemia: Secondary | ICD-10-CM

## 2016-08-04 DIAGNOSIS — Z79899 Other long term (current) drug therapy: Secondary | ICD-10-CM

## 2016-08-04 LAB — LIPID PANEL
Cholesterol: 176 mg/dL (ref <200)
HDL: 33 mg/dL — ABNORMAL LOW (ref >40)
LDL CALC: 106 mg/dL — AB (ref <100)
Total CHOL/HDL Ratio: 5.3 Ratio — ABNORMAL HIGH (ref <5.0)
Triglycerides: 184 mg/dL — ABNORMAL HIGH (ref <150)
VLDL: 37 mg/dL — AB (ref <30)

## 2016-08-04 LAB — HEPATIC FUNCTION PANEL
ALT: 14 U/L (ref 9–46)
AST: 17 U/L (ref 10–35)
Albumin: 3.3 g/dL — ABNORMAL LOW (ref 3.6–5.1)
Alkaline Phosphatase: 80 U/L (ref 40–115)
BILIRUBIN DIRECT: 0.1 mg/dL (ref <=0.2)
BILIRUBIN INDIRECT: 0.3 mg/dL (ref 0.2–1.2)
BILIRUBIN TOTAL: 0.4 mg/dL (ref 0.2–1.2)
Total Protein: 6.3 g/dL (ref 6.1–8.1)

## 2016-08-04 NOTE — Progress Notes (Signed)
POST OPERATIVE OFFICE NOTE    CC:  F/u for surgery  HPI:  This is a 60 y.o. male who is s/p left brachiocephalic AVF on 53/6/64 by Dr. Donnetta Hutching.  He does have complaints today of not being able to squeeze both his hand closed.  When he uses his other hand to help make a fist, he cannot open his hand back up and this is happening on both hands.  He states he had pain in the left hand prior to having his fistula placed.  He also states that he is having episodes of vision changes after dialysis treatments.  This does not happen every time.  He states that everything goes gray and sometimes goes completely black.  This is both eyes and not just one eye.  He also states he has pain in his legs with walking.  He does have cramping in both legs after about 3 minutes of walking.  He rests and it improves.  He does not have any wounds on his feet that will not heal.  He states his feet are cold at night.   Allergies  Allergen Reactions  . Hydralazine Rash    Current Outpatient Prescriptions  Medication Sig Dispense Refill  . albuterol (PROVENTIL HFA;VENTOLIN HFA) 108 (90 Base) MCG/ACT inhaler Inhale 2 puffs into the lungs every 6 (six) hours as needed for wheezing.    Marland Kitchen aspirin EC 325 MG EC tablet Take 1 tablet (325 mg total) by mouth daily. 30 tablet 0  . atorvastatin (LIPITOR) 80 MG tablet Take 1 tablet (80 mg total) by mouth daily at 6 PM. 30 tablet 1  . carvedilol (COREG) 3.125 MG tablet Take 1 tablet (3.125 mg total) by mouth 2 (two) times daily with a meal. 60 tablet 6  . citalopram (CELEXA) 20 MG tablet Take 1 tablet (20 mg total) by mouth daily. 30 tablet 1  . Darbepoetin Alfa (ARANESP) 100 MCG/0.5ML SOSY injection Inject 0.5 mLs (100 mcg total) into the vein every Saturday with hemodialysis. 4.2 mL   . Insulin Human (INSULIN PUMP) SOLN Inject into the skin continuous. Novolog    . Multiple Vitamin (MULTIVITAMIN WITH MINERALS) TABS tablet Take 1 tablet by mouth at bedtime.    Marland Kitchen oxyCODONE (OXY  IR/ROXICODONE) 5 MG immediate release tablet Take 1 tablet (5 mg total) by mouth every 6 (six) hours as needed for severe pain. 30 tablet 0   No current facility-administered medications for this visit.      ROS:  See HPI  Physical Exam:  Vitals:   08/04/16 1501  BP: (!) 131/54  Pulse: 64  Resp: 16  Temp: 97.3 F (36.3 C)    Incision:  Healing nicely Extremities:  + palpable bilateral radial pulses; +thrill/bruit within the fistula; easily palpable DP pulses bilaterally   Assessment/Plan:  This is a 60 y.o. male who is s/p: Left brachiocephalic AVF on 40/3/47  -the fistula is maturing nicely and has a good thrill/bruit.  It may start being used on September 22, 2016.   -he does have complaints of periodic visual changes after dialysis (not every treatment).  He has seen an eye doctor and told he had diabetic changes. -the weakness in his hands bilaterally are not related to his fistula.  He had pain in the left hand before the fistula was ever placed and his weakness in his hands is bilateral and equal.  May need an orthopedic evaluation. -the pt has bounding DP pulses bilaterally and therefore low risk for limb  loss  -we will see the pt back as needed.  After 3 successful treatments with his fistula, he can have his dialysis catheter removed. (placed by Dr. Bridgett Larsson on 06/13/16)   [name], PA-C [facility] [phone]  Clinic MD:  Pt seen and examined with Dr. Donnetta Hutching   I have examined the patient, reviewed and agree with above.  Curt Jews, MD 08/04/2016 5:01 PM

## 2016-08-11 ENCOUNTER — Ambulatory Visit (HOSPITAL_COMMUNITY)
Admission: RE | Admit: 2016-08-11 | Discharge: 2016-08-11 | Disposition: A | Discharge status: Home / Self Care | Payer: BLUE CROSS/BLUE SHIELD | Source: Ambulatory Visit | Attending: Cardiology | Admitting: Cardiology

## 2016-08-11 ENCOUNTER — Encounter (HOSPITAL_COMMUNITY): Payer: Self-pay | Admitting: Cardiology

## 2016-08-11 ENCOUNTER — Encounter (HOSPITAL_COMMUNITY): Payer: Self-pay

## 2016-08-11 VITALS — BP 123/59 | HR 73 | Wt 133.1 lb

## 2016-08-11 DIAGNOSIS — Z992 Dependence on renal dialysis: Secondary | ICD-10-CM

## 2016-08-11 DIAGNOSIS — E785 Hyperlipidemia, unspecified: Secondary | ICD-10-CM | POA: Insufficient documentation

## 2016-08-11 DIAGNOSIS — Z9889 Other specified postprocedural states: Secondary | ICD-10-CM | POA: Diagnosis not present

## 2016-08-11 DIAGNOSIS — Z951 Presence of aortocoronary bypass graft: Secondary | ICD-10-CM | POA: Diagnosis not present

## 2016-08-11 DIAGNOSIS — E1122 Type 2 diabetes mellitus with diabetic chronic kidney disease: Secondary | ICD-10-CM | POA: Insufficient documentation

## 2016-08-11 DIAGNOSIS — Z794 Long term (current) use of insulin: Secondary | ICD-10-CM | POA: Insufficient documentation

## 2016-08-11 DIAGNOSIS — Z9641 Presence of insulin pump (external) (internal): Secondary | ICD-10-CM | POA: Diagnosis not present

## 2016-08-11 DIAGNOSIS — I255 Ischemic cardiomyopathy: Secondary | ICD-10-CM | POA: Insufficient documentation

## 2016-08-11 DIAGNOSIS — I509 Heart failure, unspecified: Secondary | ICD-10-CM | POA: Diagnosis not present

## 2016-08-11 DIAGNOSIS — I739 Peripheral vascular disease, unspecified: Secondary | ICD-10-CM | POA: Diagnosis not present

## 2016-08-11 DIAGNOSIS — I132 Hypertensive heart and chronic kidney disease with heart failure and with stage 5 chronic kidney disease, or end stage renal disease: Secondary | ICD-10-CM | POA: Insufficient documentation

## 2016-08-11 DIAGNOSIS — I252 Old myocardial infarction: Secondary | ICD-10-CM | POA: Diagnosis not present

## 2016-08-11 DIAGNOSIS — I2510 Atherosclerotic heart disease of native coronary artery without angina pectoris: Secondary | ICD-10-CM | POA: Insufficient documentation

## 2016-08-11 DIAGNOSIS — I5022 Chronic systolic (congestive) heart failure: Secondary | ICD-10-CM | POA: Diagnosis not present

## 2016-08-11 DIAGNOSIS — I998 Other disorder of circulatory system: Secondary | ICD-10-CM

## 2016-08-11 DIAGNOSIS — Z7982 Long term (current) use of aspirin: Secondary | ICD-10-CM | POA: Insufficient documentation

## 2016-08-11 DIAGNOSIS — N186 End stage renal disease: Secondary | ICD-10-CM | POA: Diagnosis not present

## 2016-08-11 DIAGNOSIS — Z79899 Other long term (current) drug therapy: Secondary | ICD-10-CM | POA: Insufficient documentation

## 2016-08-11 MED ORDER — ROSUVASTATIN CALCIUM 40 MG PO TABS: 40.0000 mg | ORAL_TABLET | Freq: Every day | ORAL | 3 refills | Status: DC

## 2016-08-11 MED ORDER — LISINOPRIL 2.5 MG PO TABS: 2.5000 mg | ORAL_TABLET | Freq: Every day | ORAL | 3 refills | Status: DC

## 2016-08-11 NOTE — Patient Instructions (Addendum)
STOP taking atorvastatin  Start taking lisinopril 2.5 mg ( 1 Tab) Once Daily  Start taking Crestor 40 mg ( 1 Tab) Once Daily  Echo has been ordered for you.  Peripheral Arterial Dopplers have been ordered for you.  Lab Work in 2 months  Follow up in 6 weeks

## 2016-08-13 NOTE — Progress Notes (Signed)
PCP: Dr. Shawna Orleans [facility])  60 yo with history of type II diabetes, ESRD, CAD, and chronic systolic CHF presents for cardiology followup.  He was admitted in 9/17 with NSTEMI.  LHC showed 3VD and he had CABG x 3.  He had CKD stage IV when he was admitted and developed ESRD requiring HD while in the hospital.  He had transient atrial fibrillation post-CABG.  He developed cardiogenic shock requiring milrinone but was able to taper off.  Echo in 9/17 showed EF 35-40%.    Currently doing well, gets HD at [facility].  No chest pain. No exertional dyspnea.  No lightheadedness.  No problems with BP while on HD.  No palpitations.  Feet feel "cold" and "heavy."    Labs (11/17): LDL 106, HDL 33  PMH: 1. Type II diabetes 2. HTN 3. ESRD 4. CAD: NSTEMI 9/17 with CABG x 3 (LIMA-LAD, SVG-OM, SVG-RCA).   5. Chronic systolic CHF: Ischemic cardiomyopathy.   - Echo (9/17) with EF 35-40%, grade II diastolic dysfunction.  6. Atrial fibrillation: Paroxysmal.  Only noted post-op CABG.   SH: Married, lives in [place], 1 son, nonsmoker, no ETOH, speaks only [place].   FH: No premature CAD.  ROS: All systems reviewed and negative except as per HPI.   Current Outpatient Prescriptions  Medication Sig Dispense Refill  . albuterol (PROVENTIL HFA;VENTOLIN HFA) 108 (90 Base) MCG/ACT inhaler Inhale 2 puffs into the lungs every 6 (six) hours as needed for wheezing.    Marland Kitchen aspirin EC 325 MG EC tablet Take 1 tablet (325 mg total) by mouth daily. 30 tablet 0  . carvedilol (COREG) 3.125 MG tablet Take 1 tablet (3.125 mg total) by mouth 2 (two) times daily with a meal. 60 tablet 6  . citalopram (CELEXA) 20 MG tablet Take 1 tablet (20 mg total) by mouth daily. 30 tablet 1  . Darbepoetin Alfa (ARANESP) 100 MCG/0.5ML SOSY injection Inject 0.5 mLs (100 mcg total) into the vein every Saturday with hemodialysis. 4.2 mL   . Insulin Human (INSULIN PUMP) SOLN Inject into the skin continuous. Novolog    . Multiple Vitamin (MULTIVITAMIN  WITH MINERALS) TABS tablet Take 1 tablet by mouth at bedtime.    Marland Kitchen oxyCODONE (OXY IR/ROXICODONE) 5 MG immediate release tablet Take 1 tablet (5 mg total) by mouth every 6 (six) hours as needed for severe pain. 30 tablet 0  . lisinopril (PRINIVIL,ZESTRIL) 2.5 MG tablet Take 1 tablet (2.5 mg total) by mouth daily. 30 tablet 3  . rosuvastatin (CRESTOR) 40 MG tablet Take 1 tablet (40 mg total) by mouth daily. 30 tablet 3   No current facility-administered medications for this encounter.    BP (!) 123/59   Pulse 73   Wt 133 lb 1.9 oz (60.4 kg)   SpO2 100%   BMI 21.49 kg/m  General: NAD Neck: No JVD, no thyromegaly or thyroid nodule.  Lungs: Clear to auscultation bilaterally with normal respiratory effort. CV: Nondisplaced PMI.  Heart regular S1/S2, no S3/S4, no murmur.  No peripheral edema.  No carotid bruit.  Difficult to palpate pedal pulses.  Abdomen: Soft, nontender, no hepatosplenomegaly, no distention.  Skin: Intact without lesions or rashes.  Neurologic: Alert and oriented x 3.  Psych: Normal affect. Extremities: No clubbing or cyanosis.  HEENT: Normal.   Assessment/Plan: 1. CAD: s/p CABG.  No chest pain. - Continue ASA  - LDL is elevated, will need more potent statin.  2. Hyperlipidemia: Goal LDL < 70.  Stop atorvastatin, start Crestor 40 mg  daily with lipids/LFTs in 1 month.  3. Chronic systolic CHF: Ischemic cardiomyopathy.  Volume controlled by HD.  EF 35-40% on 9/17 echo.  - Repeat echo in 12/17.  - Continue Coreg.  4. PAD: Feet feel "cold" and "heavy," difficult to feel peripheral pulses.  I will arrange for peripheral arterial doppler evaluation.  - Add lisinopril 2.5 mg daily.    Loralie Champagne 08/13/2016

## 2016-08-18 DIAGNOSIS — K219 Gastro-esophageal reflux disease without esophagitis: Secondary | ICD-10-CM | POA: Insufficient documentation

## 2016-08-18 HISTORY — DX: Gastro-esophageal reflux disease without esophagitis: K21.9

## 2016-08-26 ENCOUNTER — Other Ambulatory Visit: Payer: Self-pay | Admitting: Cardiology

## 2016-08-26 DIAGNOSIS — R0989 Other specified symptoms and signs involving the circulatory and respiratory systems: Secondary | ICD-10-CM

## 2016-09-01 ENCOUNTER — Ambulatory Visit (HOSPITAL_COMMUNITY)
Admission: RE | Admit: 2016-09-01 | Discharge: 2016-09-01 | Disposition: A | Discharge status: Home / Self Care | Payer: BLUE CROSS/BLUE SHIELD | Source: Ambulatory Visit | Attending: Cardiology | Admitting: Cardiology

## 2016-09-01 DIAGNOSIS — I998 Other disorder of circulatory system: Secondary | ICD-10-CM

## 2016-09-01 DIAGNOSIS — I739 Peripheral vascular disease, unspecified: Secondary | ICD-10-CM | POA: Insufficient documentation

## 2016-09-01 DIAGNOSIS — R0989 Other specified symptoms and signs involving the circulatory and respiratory systems: Secondary | ICD-10-CM

## 2016-09-01 DIAGNOSIS — M79606 Pain in leg, unspecified: Secondary | ICD-10-CM

## 2016-09-22 ENCOUNTER — Ambulatory Visit (HOSPITAL_COMMUNITY)
Admission: RE | Admit: 2016-09-22 | Discharge: 2016-09-22 | Disposition: A | Discharge status: Home / Self Care | Payer: Medicare Other | Source: Ambulatory Visit | Attending: Cardiology | Admitting: Cardiology

## 2016-09-22 ENCOUNTER — Encounter (HOSPITAL_COMMUNITY): Payer: Self-pay

## 2016-09-22 VITALS — BP 170/68 | HR 58 | Wt 140.5 lb

## 2016-09-22 DIAGNOSIS — N184 Chronic kidney disease, stage 4 (severe): Secondary | ICD-10-CM | POA: Insufficient documentation

## 2016-09-22 DIAGNOSIS — E785 Hyperlipidemia, unspecified: Secondary | ICD-10-CM | POA: Diagnosis not present

## 2016-09-22 DIAGNOSIS — I255 Ischemic cardiomyopathy: Secondary | ICD-10-CM | POA: Diagnosis not present

## 2016-09-22 DIAGNOSIS — E1122 Type 2 diabetes mellitus with diabetic chronic kidney disease: Secondary | ICD-10-CM | POA: Diagnosis not present

## 2016-09-22 DIAGNOSIS — Z951 Presence of aortocoronary bypass graft: Secondary | ICD-10-CM | POA: Insufficient documentation

## 2016-09-22 DIAGNOSIS — I2510 Atherosclerotic heart disease of native coronary artery without angina pectoris: Secondary | ICD-10-CM | POA: Diagnosis not present

## 2016-09-22 DIAGNOSIS — Z7982 Long term (current) use of aspirin: Secondary | ICD-10-CM | POA: Diagnosis not present

## 2016-09-22 DIAGNOSIS — I5022 Chronic systolic (congestive) heart failure: Secondary | ICD-10-CM | POA: Insufficient documentation

## 2016-09-22 DIAGNOSIS — E784 Other hyperlipidemia: Secondary | ICD-10-CM | POA: Diagnosis not present

## 2016-09-22 DIAGNOSIS — I130 Hypertensive heart and chronic kidney disease with heart failure and stage 1 through stage 4 chronic kidney disease, or unspecified chronic kidney disease: Secondary | ICD-10-CM | POA: Insufficient documentation

## 2016-09-22 DIAGNOSIS — I252 Old myocardial infarction: Secondary | ICD-10-CM | POA: Insufficient documentation

## 2016-09-22 DIAGNOSIS — I480 Paroxysmal atrial fibrillation: Secondary | ICD-10-CM | POA: Diagnosis not present

## 2016-09-22 DIAGNOSIS — E7849 Other hyperlipidemia: Secondary | ICD-10-CM

## 2016-09-22 DIAGNOSIS — I739 Peripheral vascular disease, unspecified: Secondary | ICD-10-CM | POA: Insufficient documentation

## 2016-09-22 DIAGNOSIS — Z9889 Other specified postprocedural states: Secondary | ICD-10-CM | POA: Diagnosis not present

## 2016-09-22 LAB — BRAIN NATRIURETIC PEPTIDE: B NATRIURETIC PEPTIDE 5: 1972 pg/mL — AB (ref 0.0–100.0)

## 2016-09-22 LAB — BASIC METABOLIC PANEL
ANION GAP: 5 (ref 5–15)
BUN: 53 mg/dL — ABNORMAL HIGH (ref 6–20)
CALCIUM: 8.9 mg/dL (ref 8.9–10.3)
CHLORIDE: 110 mmol/L (ref 101–111)
CO2: 21 mmol/L — AB (ref 22–32)
CREATININE: 3.87 mg/dL — AB (ref 0.61–1.24)
GFR calc non Af Amer: 16 mL/min — ABNORMAL LOW (ref >60)
GFR, EST AFRICAN AMERICAN: 18 mL/min — AB (ref >60)
Glucose, Bld: 256 mg/dL — ABNORMAL HIGH (ref 65–99)
Potassium: 4.9 mmol/L (ref 3.5–5.1)
SODIUM: 136 mmol/L (ref 135–145)

## 2016-09-22 MED ORDER — FUROSEMIDE 40 MG PO TABS: 40.0000 mg | ORAL_TABLET | Freq: Every day | ORAL | 3 refills | Status: DC

## 2016-09-22 MED ORDER — CARVEDILOL 6.25 MG PO TABS: 6.2500 mg | ORAL_TABLET | Freq: Two times a day (BID) | ORAL | 3 refills | Status: DC

## 2016-09-22 MED ORDER — EZETIMIBE 10 MG PO TABS: 10.0000 mg | ORAL_TABLET | Freq: Every day | ORAL | 3 refills | Status: DC

## 2016-09-22 MED ORDER — ATORVASTATIN CALCIUM 80 MG PO TABS: 80.0000 mg | ORAL_TABLET | Freq: Every day | ORAL | 3 refills | Status: DC

## 2016-09-22 NOTE — Patient Instructions (Signed)
Increase Carvedilol to 6.25 mg Twice daily   Start Furosemide (Lasix) 40 mg daily  Stop Crestor  Start Atorvastatin 80 mg daily  Start Zetia 10 mg daily  Labs today  Labs in 1 week  Your physician has requested that you have an echocardiogram. Echocardiography is a painless test that uses sound waves to create images of your heart. It provides your doctor with information about the size and shape of your heart and how well your heart's chambers and valves are working. This procedure takes approximately one hour. There are no restrictions for this procedure.  Your physician recommends that you schedule a follow-up appointment in: 2-3 weeks

## 2016-09-23 NOTE — Progress Notes (Signed)
PCP: Dr. Tasia Catchings [facility])  61 yo with history of type II diabetes, CKD stage IV requiring HD for a period of time, CAD, and chronic systolic CHF presents for cardiology followup.  He was admitted in 9/17 with NSTEMI.  LHC showed 3VD and he had CABG x 3.  He had CKD stage IV when he was admitted and developed ESRD requiring HD while in the hospital.  He had transient atrial fibrillation post-CABG.  He developed cardiogenic shock requiring milrinone but was able to taper off.  Echo in 9/17 showed EF 35-40%.  Since last appointment, he has stopped HD.    BP running higher off HD.  Weight up 7 lbs today. He is short of breath walking short distances, this does seem somewhat worse since stopping HD.  Occasional atypical chest pain.  No lightheadedness/syncope. No orthopnea/PND.    Labs (11/17): LDL 106, HDL 33  PMH: 1. Type II diabetes 2. HTN 3. CKD stage IV: Was on HD briefly but now off.  4. CAD: NSTEMI 9/17 with CABG x 3 (LIMA-LAD, SVG-OM, SVG-RCA).   5. Chronic systolic CHF: Ischemic cardiomyopathy.   - Echo (9/17) with EF 35-40%, grade II diastolic dysfunction.  6. Atrial fibrillation: Paroxysmal.  Only noted post-op CABG.  7. ABIs normal in 12/17.   SH: Married, lives in [place], 1 son, nonsmoker, no ETOH, speaks only [place].   FH: No premature CAD.  ROS: All systems reviewed and negative except as per HPI.   Current Outpatient Prescriptions  Medication Sig Dispense Refill  . albuterol (PROVENTIL HFA;VENTOLIN HFA) 108 (90 Base) MCG/ACT inhaler Inhale 2 puffs into the lungs every 6 (six) hours as needed for wheezing.    Marland Kitchen aspirin EC 325 MG EC tablet Take 1 tablet (325 mg total) by mouth daily. 30 tablet 0  . carvedilol (COREG) 6.25 MG tablet Take 1 tablet (6.25 mg total) by mouth 2 (two) times daily with a meal. 60 tablet 3  . citalopram (CELEXA) 20 MG tablet Take 1 tablet (20 mg total) by mouth daily. 30 tablet 1  . Darbepoetin Alfa (ARANESP) 100 MCG/0.5ML SOSY injection Inject 0.5 mLs  (100 mcg total) into the vein every Saturday with hemodialysis. 4.2 mL   . hydrALAZINE (APRESOLINE) 50 MG tablet Take 50 mg by mouth 2 (two) times daily.    . Insulin Human (INSULIN PUMP) SOLN Inject into the skin continuous. Novolog    . lisinopril (PRINIVIL,ZESTRIL) 2.5 MG tablet Take 1 tablet (2.5 mg total) by mouth daily. 30 tablet 3  . Multiple Vitamin (MULTIVITAMIN WITH MINERALS) TABS tablet Take 1 tablet by mouth at bedtime.    Marland Kitchen oxyCODONE (OXY IR/ROXICODONE) 5 MG immediate release tablet Take 1 tablet (5 mg total) by mouth every 6 (six) hours as needed for severe pain. 30 tablet 0  . atorvastatin (LIPITOR) 80 MG tablet Take 1 tablet (80 mg total) by mouth daily. 30 tablet 3  . ezetimibe (ZETIA) 10 MG tablet Take 1 tablet (10 mg total) by mouth daily. 30 tablet 3  . furosemide (LASIX) 40 MG tablet Take 1 tablet (40 mg total) by mouth daily. 30 tablet 3   No current facility-administered medications for this encounter.    BP (!) 170/68   Pulse (!) 58   Wt 140 lb 8 oz (63.7 kg)   SpO2 99%   BMI 22.68 kg/m  General: NAD Neck: JVP 8-9 cm, no thyromegaly or thyroid nodule.  Lungs: Mild crackles bilaterally CV: Nondisplaced PMI.  Heart regular S1/S2, no S3/S4,  no murmur.  Trace ankle edema.  No carotid bruit.  Difficult to palpate pedal pulses.  Abdomen: Soft, nontender, no hepatosplenomegaly, no distention.  Skin: Intact without lesions or rashes.  Neurologic: Alert and oriented x 3.  Psych: Normal affect. Extremities: No clubbing or cyanosis.  HEENT: Normal.   Assessment/Plan: 1. CAD: s/p CABG.  No exertional chest pain. - Continue ASA  - Continue statin (will need to change, see below).   2. Hyperlipidemia: Goal LDL < 70.  Given significant CKD, maximum safe Crestor dose is only 10 mg daily.  Therefore, I will take him off Crestor and restart atorvastatin 80 mg daily along with Zetia 10 mg daily.  Check lipids/LFTs in 2 months.  3. Chronic systolic CHF: Ischemic cardiomyopathy.   EF 35-40% on 9/17 echo. Volume was controlled by HD, but HD was stopped 1 month ago.  NYHA class III symptoms with volume overload on exam.  - Add Lasix 40 mg daily.  BMET/BNP today and again in 1 week.   - Increase Coreg to 6.25 mg bid.  - Can continue lisinopril 2.5 daily but would not increase dose with significant CKD.   - Get echo to reassess EF.  4. PAD: ABIs normal in 12/17. 5. HTN: Suspect this is driven in part by volume overload in setting of renal dysfunction (no longer getting HD). As above, adding Lasix and increasing Coreg.    Kenneth [name] 09/23/2016

## 2016-09-25 ENCOUNTER — Ambulatory Visit (HOSPITAL_COMMUNITY)
Admission: RE | Admit: 2016-09-25 | Discharge: 2016-09-25 | Disposition: A | Discharge status: Home / Self Care | Payer: Medicare Other | Source: Ambulatory Visit | Attending: Internal Medicine | Admitting: Internal Medicine

## 2016-09-25 DIAGNOSIS — I509 Heart failure, unspecified: Secondary | ICD-10-CM | POA: Insufficient documentation

## 2016-09-25 DIAGNOSIS — I5022 Chronic systolic (congestive) heart failure: Secondary | ICD-10-CM

## 2016-09-25 LAB — ECHOCARDIOGRAM COMPLETE
E decel time: 218 msec
EERAT: 8.46
FS: 29 % (ref 28–44)
IVS/LV PW RATIO, ED: 0.92
LA diam end sys: 31 mm
LA diam index: 1.8 cm/m2
LA vol A4C: 49.3 ml
LA vol index: 29.4 mL/m2
LASIZE: 31 mm
LAVOL: 50.5 mL
LDCA: 3.46 cm2
LV E/e' medial: 8.46
LV E/e'average: 8.46
LV PW d: 11.1 mm — AB (ref 0.6–1.1)
LV TDI E'LATERAL: 12.3
LV e' LATERAL: 12.3 cm/s
LVOTD: 21 mm
MV Dec: 218
MV Peak grad: 4 mmHg
MV pk A vel: 68.9 m/s
MVPKEVEL: 104 m/s
RV LATERAL S' VELOCITY: 10.1 cm/s
RV TAPSE: 15.1 mm
TDI e' medial: 6.09

## 2016-09-25 NOTE — Progress Notes (Signed)
  Echocardiogram 2D Echocardiogram has been performed.  Kenneth [name] 09/25/2016, 10:38 AM

## 2016-09-29 ENCOUNTER — Ambulatory Visit (HOSPITAL_COMMUNITY)
Admission: RE | Admit: 2016-09-29 | Discharge: 2016-09-29 | Disposition: A | Discharge status: Home / Self Care | Payer: Medicare Other | Source: Ambulatory Visit | Attending: Cardiology | Admitting: Cardiology

## 2016-09-29 DIAGNOSIS — I509 Heart failure, unspecified: Secondary | ICD-10-CM | POA: Insufficient documentation

## 2016-09-29 LAB — HEPATIC FUNCTION PANEL
ALK PHOS: 115 U/L (ref 38–126)
ALT: 62 U/L (ref 17–63)
AST: 46 U/L — ABNORMAL HIGH (ref 15–41)
Albumin: 3.7 g/dL (ref 3.5–5.0)
Bilirubin, Direct: <0.1 mg/dL — ABNORMAL LOW (ref 0.1–0.5)
TOTAL PROTEIN: 7 g/dL (ref 6.5–8.1)
Total Bilirubin: 0.7 mg/dL (ref 0.3–1.2)

## 2016-09-29 LAB — LIPID PANEL
CHOL/HDL RATIO: 3.8 ratio
Cholesterol: 100 mg/dL (ref 0–200)
HDL: 26 mg/dL — AB (ref >40)
LDL Cholesterol: 44 mg/dL (ref 0–99)
Triglycerides: 152 mg/dL — ABNORMAL HIGH (ref <150)
VLDL: 30 mg/dL (ref 0–40)

## 2016-10-06 ENCOUNTER — Telehealth (HOSPITAL_COMMUNITY): Payer: Self-pay | Admitting: *Deleted

## 2016-10-06 NOTE — Telephone Encounter (Signed)
Notes Recorded by Kennieth Rad, RN on 10/06/2016 at 4:41 PM EST Translator for patient called me back and I told him that everything looked good from his Echo. No further questions at this time. ------  Notes Recorded by Kennieth Rad, RN on 10/06/2016 at 9:56 AM EST Called and left message to call back. ------  Notes Recorded by Scarlette Calico, RN on 10/02/2016 at 2:14 PM EST Left message to call back ------  Notes Recorded by Larey Dresser, MD on 09/26/2016 at 2:30 PM EST EF improved to normal range, good news

## 2016-10-13 ENCOUNTER — Ambulatory Visit (HOSPITAL_COMMUNITY)
Admission: RE | Admit: 2016-10-13 | Discharge: 2016-10-13 | Disposition: A | Discharge status: Home / Self Care | Payer: Medicare Other | Source: Ambulatory Visit | Attending: Cardiology | Admitting: Cardiology

## 2016-10-13 ENCOUNTER — Other Ambulatory Visit (HOSPITAL_COMMUNITY): Payer: Self-pay | Admitting: *Deleted

## 2016-10-13 DIAGNOSIS — I509 Heart failure, unspecified: Secondary | ICD-10-CM

## 2016-10-13 LAB — HEPATIC FUNCTION PANEL
ALK PHOS: 79 U/L (ref 38–126)
ALT: 36 U/L (ref 17–63)
AST: 27 U/L (ref 15–41)
Albumin: 3.6 g/dL (ref 3.5–5.0)
Bilirubin, Direct: <0.1 mg/dL — ABNORMAL LOW (ref 0.1–0.5)
TOTAL PROTEIN: 7 g/dL (ref 6.5–8.1)
Total Bilirubin: 1 mg/dL (ref 0.3–1.2)

## 2016-10-13 LAB — LIPID PANEL
Cholesterol: 70 mg/dL (ref 0–200)
HDL: 26 mg/dL — AB (ref >40)
LDL Cholesterol: 33 mg/dL (ref 0–99)
TRIGLYCERIDES: 54 mg/dL (ref <150)
Total CHOL/HDL Ratio: 2.7 RATIO
VLDL: 11 mg/dL (ref 0–40)

## 2016-10-19 ENCOUNTER — Ambulatory Visit (HOSPITAL_COMMUNITY)
Admission: RE | Admit: 2016-10-19 | Discharge: 2016-10-19 | Disposition: A | Discharge status: Home / Self Care | Payer: Medicare Other | Source: Ambulatory Visit | Attending: Cardiology | Admitting: Cardiology

## 2016-10-19 VITALS — BP 153/60 | HR 71 | Wt 140.5 lb

## 2016-10-19 DIAGNOSIS — I130 Hypertensive heart and chronic kidney disease with heart failure and stage 1 through stage 4 chronic kidney disease, or unspecified chronic kidney disease: Secondary | ICD-10-CM | POA: Insufficient documentation

## 2016-10-19 DIAGNOSIS — I2510 Atherosclerotic heart disease of native coronary artery without angina pectoris: Secondary | ICD-10-CM | POA: Insufficient documentation

## 2016-10-19 DIAGNOSIS — E784 Other hyperlipidemia: Secondary | ICD-10-CM | POA: Diagnosis not present

## 2016-10-19 DIAGNOSIS — Z7982 Long term (current) use of aspirin: Secondary | ICD-10-CM | POA: Insufficient documentation

## 2016-10-19 DIAGNOSIS — E785 Hyperlipidemia, unspecified: Secondary | ICD-10-CM | POA: Insufficient documentation

## 2016-10-19 DIAGNOSIS — Z794 Long term (current) use of insulin: Secondary | ICD-10-CM | POA: Insufficient documentation

## 2016-10-19 DIAGNOSIS — I5022 Chronic systolic (congestive) heart failure: Secondary | ICD-10-CM | POA: Diagnosis present

## 2016-10-19 DIAGNOSIS — E7849 Other hyperlipidemia: Secondary | ICD-10-CM

## 2016-10-19 DIAGNOSIS — I739 Peripheral vascular disease, unspecified: Secondary | ICD-10-CM | POA: Insufficient documentation

## 2016-10-19 DIAGNOSIS — Z9889 Other specified postprocedural states: Secondary | ICD-10-CM | POA: Insufficient documentation

## 2016-10-19 DIAGNOSIS — Z951 Presence of aortocoronary bypass graft: Secondary | ICD-10-CM | POA: Diagnosis not present

## 2016-10-19 DIAGNOSIS — I255 Ischemic cardiomyopathy: Secondary | ICD-10-CM | POA: Diagnosis not present

## 2016-10-19 DIAGNOSIS — E1122 Type 2 diabetes mellitus with diabetic chronic kidney disease: Secondary | ICD-10-CM | POA: Diagnosis not present

## 2016-10-19 DIAGNOSIS — I480 Paroxysmal atrial fibrillation: Secondary | ICD-10-CM | POA: Diagnosis not present

## 2016-10-19 DIAGNOSIS — I252 Old myocardial infarction: Secondary | ICD-10-CM | POA: Insufficient documentation

## 2016-10-19 DIAGNOSIS — N184 Chronic kidney disease, stage 4 (severe): Secondary | ICD-10-CM | POA: Diagnosis not present

## 2016-10-19 LAB — COMPREHENSIVE METABOLIC PANEL
ALBUMIN: 3.5 g/dL (ref 3.5–5.0)
ALK PHOS: 91 U/L (ref 38–126)
ALT: 52 U/L (ref 17–63)
AST: 36 U/L (ref 15–41)
Anion gap: 12 (ref 5–15)
BILIRUBIN TOTAL: 0.8 mg/dL (ref 0.3–1.2)
BUN: 100 mg/dL — AB (ref 6–20)
CO2: 24 mmol/L (ref 22–32)
CREATININE: 4.8 mg/dL — AB (ref 0.61–1.24)
Calcium: 9.2 mg/dL (ref 8.9–10.3)
Chloride: 100 mmol/L — ABNORMAL LOW (ref 101–111)
GFR calc Af Amer: 14 mL/min — ABNORMAL LOW (ref >60)
GFR, EST NON AFRICAN AMERICAN: 12 mL/min — AB (ref >60)
GLUCOSE: 111 mg/dL — AB (ref 65–99)
POTASSIUM: 4.3 mmol/L (ref 3.5–5.1)
Sodium: 136 mmol/L (ref 135–145)
TOTAL PROTEIN: 6.8 g/dL (ref 6.5–8.1)

## 2016-10-19 MED ORDER — HYDRALAZINE HCL 50 MG PO TABS: 75.0000 mg | ORAL_TABLET | Freq: Two times a day (BID) | ORAL | 3 refills | Status: DC

## 2016-10-19 MED ORDER — FUROSEMIDE 40 MG PO TABS: 60.0000 mg | ORAL_TABLET | Freq: Two times a day (BID) | ORAL | 3 refills | Status: DC

## 2016-10-19 NOTE — Patient Instructions (Signed)
Increase Furosemide to 60 mg (1 & 1/2 tabs) Twice daily   Increase Hydralazine to 75 mg (1 & 1/2 tabs) Twice daily   Labs today  Your physician recommends that you schedule a follow-up appointment in: 1 month

## 2016-10-19 NOTE — Addendum Note (Signed)
Encounter addended by: Larey Dresser, MD on: 10/19/2016 10:41 PM<BR>    Actions taken: Sign clinical note

## 2016-10-19 NOTE — Progress Notes (Addendum)
PCP: Dr. Tasia Catchings [facility]) Cardiology: Dr. Aundra Dubin  61 yo with history of type II diabetes, CKD stage IV requiring HD for a period of time, CAD, and chronic systolic CHF presents for cardiology followup.  He was admitted in 9/17 with NSTEMI.  LHC showed 3VD and he had CABG x 3.  He had CKD stage IV when he was admitted and developed ESRD requiring HD while in the hospital.  He had transient atrial fibrillation post-CABG.  He developed cardiogenic shock requiring milrinone but was able to taper off.  Echo in 9/17 showed EF 35-40%.  He has been able to stop HD but creatinine has been trending up.  Repeat echo in 1/18 showed EF up to 60-65%.     BP continues to run high off HD.  Weight stable. He is short of breath walking 100-200 feet.  No chest pain, no orthopnea/PND.  No lightheadedness/syncope. Appetite is poor.     Labs (11/17): LDL 106, HDL 33 Labs (1/18): LDL 33, AST 46, ALT 62, BNP 1972, K 4.9, creatinine 3.87  PMH: 1. Type II diabetes 2. HTN 3. CKD stage IV: Was on HD briefly but now off.  4. CAD: NSTEMI 9/17 with CABG x 3 (LIMA-LAD, SVG-OM, SVG-RCA).   5. Chronic systolic CHF: Ischemic cardiomyopathy.   - Echo (9/17) with EF 35-40%, grade II diastolic dysfunction.  - Echo (1/18) with EF 60-65%, grade II diastolic dysfunction. 6. Atrial fibrillation: Paroxysmal.  Only noted post-op CABG.  7. ABIs normal in 12/17.   SH: Married, lives in [place], 1 son, nonsmoker, no ETOH, speaks only [place].   FH: No premature CAD.  ROS: All systems reviewed and negative except as per HPI.   Current Outpatient Prescriptions  Medication Sig Dispense Refill  . albuterol (PROVENTIL HFA;VENTOLIN HFA) 108 (90 Base) MCG/ACT inhaler Inhale 2 puffs into the lungs every 6 (six) hours as needed for wheezing.    Marland Kitchen aspirin EC 325 MG EC tablet Take 1 tablet (325 mg total) by mouth daily. 30 tablet 0  . atorvastatin (LIPITOR) 80 MG tablet Take 1 tablet (80 mg total) by mouth daily. 30 tablet 3  . carvedilol  (COREG) 6.25 MG tablet Take 1 tablet (6.25 mg total) by mouth 2 (two) times daily with a meal. 60 tablet 3  . citalopram (CELEXA) 20 MG tablet Take 1 tablet (20 mg total) by mouth daily. 30 tablet 1  . Darbepoetin Alfa (ARANESP) 100 MCG/0.5ML SOSY injection Inject 0.5 mLs (100 mcg total) into the vein every Saturday with hemodialysis. 4.2 mL   . ezetimibe (ZETIA) 10 MG tablet Take 1 tablet (10 mg total) by mouth daily. 30 tablet 3  . furosemide (LASIX) 40 MG tablet Take 1.5 tablets (60 mg total) by mouth 2 (two) times daily. 90 tablet 3  . hydrALAZINE (APRESOLINE) 50 MG tablet Take 1.5 tablets (75 mg total) by mouth 2 (two) times daily. 90 tablet 3  . Insulin Human (INSULIN PUMP) SOLN Inject into the skin continuous. Novolog    . Multiple Vitamin (MULTIVITAMIN WITH MINERALS) TABS tablet Take 1 tablet by mouth at bedtime.    Marland Kitchen oxyCODONE (OXY IR/ROXICODONE) 5 MG immediate release tablet Take 1 tablet (5 mg total) by mouth every 6 (six) hours as needed for severe pain. 30 tablet 0   No current facility-administered medications for this encounter.    BP (!) 153/60   Pulse 71   Wt 140 lb 8 oz (63.7 kg)   SpO2 100%   BMI 22.68 kg/m  General: NAD Neck: JVP 10-12 cm, no thyromegaly or thyroid nodule.  Lungs: Mild crackles bilaterally CV: Nondisplaced PMI.  Heart regular S1/S2, no S3/S4, 1/6 SEM RUSB.  No edema.  No carotid bruit.  Difficult to palpate pedal pulses.  Abdomen: Soft, nontender, no hepatosplenomegaly, no distention.  Skin: Intact without lesions or rashes.  Neurologic: Alert and oriented x 3.  Psych: Normal affect. Extremities: No clubbing or cyanosis.  HEENT: Normal.   Assessment/Plan: 1. CAD: s/p CABG.  No exertional chest pain. - Continue ASA and atorvastatin.    2. Hyperlipidemia: Good lipids on Zetia + atorvastatin.  3. Chronic systolic CHF: Ischemic cardiomyopathy.  EF 35-40% on 9/17 echo but increased back to 60-65% on 1/18 echo. NYHA class III symptoms with volume  overload on exam.  I suspect dyspnea and volume retention is driven in large part by his renal failure. - Increase Lasix to 60 mg bid.  BMET today and again in 2 wks.   - Continue Coreg 6.25 mg bid.  - Increase hydralazine to 75 mg bid.  4. PAD: ABIs normal in 12/17. 5. HTN: Suspect this is driven in part by volume overload in setting of renal dysfunction (no longer getting HD). As above, increasing Lasix and hydralazine.  6. CKD: Stage IV currently.  I am concerned that he may need to go back on HD eventually given difficulty clearing volume and rising BUN/creatinine.  Poor appetite, may be due to CHF involving gut but also would consider developing uremia.  He will see Dr. Moshe Cipro soon.   Followup 1 month.    Loralie Champagne 10/19/2016

## 2016-10-30 ENCOUNTER — Emergency Department (HOSPITAL_COMMUNITY): Payer: Medicare Other

## 2016-10-30 ENCOUNTER — Encounter (HOSPITAL_COMMUNITY): Payer: Self-pay | Admitting: Internal Medicine

## 2016-10-30 ENCOUNTER — Inpatient Hospital Stay (HOSPITAL_COMMUNITY)
Admission: EM | Admit: 2016-10-30 | Discharge: 2016-11-05 | DRG: 640 | Disposition: A | Discharge status: Home Health | Payer: Medicare Other | Attending: Internal Medicine | Admitting: Internal Medicine

## 2016-10-30 DIAGNOSIS — I480 Paroxysmal atrial fibrillation: Secondary | ICD-10-CM | POA: Diagnosis not present

## 2016-10-30 DIAGNOSIS — I214 Non-ST elevation (NSTEMI) myocardial infarction: Secondary | ICD-10-CM | POA: Diagnosis not present

## 2016-10-30 DIAGNOSIS — R945 Abnormal results of liver function studies: Secondary | ICD-10-CM

## 2016-10-30 DIAGNOSIS — Y838 Other surgical procedures as the cause of abnormal reaction of the patient, or of later complication, without mention of misadventure at the time of the procedure: Secondary | ICD-10-CM | POA: Diagnosis not present

## 2016-10-30 DIAGNOSIS — G9340 Encephalopathy, unspecified: Secondary | ICD-10-CM | POA: Diagnosis present

## 2016-10-30 DIAGNOSIS — Z79899 Other long term (current) drug therapy: Secondary | ICD-10-CM

## 2016-10-30 DIAGNOSIS — E1122 Type 2 diabetes mellitus with diabetic chronic kidney disease: Secondary | ICD-10-CM | POA: Diagnosis not present

## 2016-10-30 DIAGNOSIS — E1151 Type 2 diabetes mellitus with diabetic peripheral angiopathy without gangrene: Secondary | ICD-10-CM | POA: Diagnosis present

## 2016-10-30 DIAGNOSIS — Z794 Long term (current) use of insulin: Secondary | ICD-10-CM

## 2016-10-30 DIAGNOSIS — Z888 Allergy status to other drugs, medicaments and biological substances status: Secondary | ICD-10-CM

## 2016-10-30 DIAGNOSIS — Z833 Family history of diabetes mellitus: Secondary | ICD-10-CM

## 2016-10-30 DIAGNOSIS — Z87891 Personal history of nicotine dependence: Secondary | ICD-10-CM

## 2016-10-30 DIAGNOSIS — N186 End stage renal disease: Secondary | ICD-10-CM | POA: Diagnosis present

## 2016-10-30 DIAGNOSIS — L7632 Postprocedural hematoma of skin and subcutaneous tissue following other procedure: Secondary | ICD-10-CM | POA: Diagnosis not present

## 2016-10-30 DIAGNOSIS — I132 Hypertensive heart and chronic kidney disease with heart failure and with stage 5 chronic kidney disease, or end stage renal disease: Secondary | ICD-10-CM | POA: Diagnosis present

## 2016-10-30 DIAGNOSIS — Z992 Dependence on renal dialysis: Secondary | ICD-10-CM | POA: Diagnosis present

## 2016-10-30 DIAGNOSIS — E8779 Other fluid overload: Principal | ICD-10-CM | POA: Diagnosis present

## 2016-10-30 DIAGNOSIS — I5042 Chronic combined systolic (congestive) and diastolic (congestive) heart failure: Secondary | ICD-10-CM | POA: Diagnosis present

## 2016-10-30 DIAGNOSIS — L8990 Pressure ulcer of unspecified site, unspecified stage: Secondary | ICD-10-CM | POA: Insufficient documentation

## 2016-10-30 DIAGNOSIS — N185 Chronic kidney disease, stage 5: Secondary | ICD-10-CM | POA: Diagnosis not present

## 2016-10-30 DIAGNOSIS — I5033 Acute on chronic diastolic (congestive) heart failure: Secondary | ICD-10-CM | POA: Diagnosis not present

## 2016-10-30 DIAGNOSIS — N19 Unspecified kidney failure: Secondary | ICD-10-CM | POA: Diagnosis present

## 2016-10-30 DIAGNOSIS — R001 Bradycardia, unspecified: Secondary | ICD-10-CM | POA: Diagnosis not present

## 2016-10-30 DIAGNOSIS — J810 Acute pulmonary edema: Secondary | ICD-10-CM | POA: Diagnosis not present

## 2016-10-30 DIAGNOSIS — K8020 Calculus of gallbladder without cholecystitis without obstruction: Secondary | ICD-10-CM | POA: Diagnosis present

## 2016-10-30 DIAGNOSIS — Z9641 Presence of insulin pump (external) (internal): Secondary | ICD-10-CM | POA: Diagnosis present

## 2016-10-30 DIAGNOSIS — I2510 Atherosclerotic heart disease of native coronary artery without angina pectoris: Secondary | ICD-10-CM | POA: Diagnosis present

## 2016-10-30 DIAGNOSIS — E785 Hyperlipidemia, unspecified: Secondary | ICD-10-CM | POA: Diagnosis present

## 2016-10-30 DIAGNOSIS — I252 Old myocardial infarction: Secondary | ICD-10-CM

## 2016-10-30 DIAGNOSIS — I959 Hypotension, unspecified: Secondary | ICD-10-CM | POA: Diagnosis not present

## 2016-10-30 DIAGNOSIS — I25118 Atherosclerotic heart disease of native coronary artery with other forms of angina pectoris: Secondary | ICD-10-CM | POA: Diagnosis present

## 2016-10-30 DIAGNOSIS — G9349 Other encephalopathy: Secondary | ICD-10-CM | POA: Diagnosis present

## 2016-10-30 DIAGNOSIS — I5022 Chronic systolic (congestive) heart failure: Secondary | ICD-10-CM

## 2016-10-30 DIAGNOSIS — Z7982 Long term (current) use of aspirin: Secondary | ICD-10-CM

## 2016-10-30 DIAGNOSIS — D631 Anemia in chronic kidney disease: Secondary | ICD-10-CM | POA: Diagnosis present

## 2016-10-30 DIAGNOSIS — R778 Other specified abnormalities of plasma proteins: Secondary | ICD-10-CM | POA: Diagnosis present

## 2016-10-30 DIAGNOSIS — N2581 Secondary hyperparathyroidism of renal origin: Secondary | ICD-10-CM | POA: Diagnosis present

## 2016-10-30 DIAGNOSIS — E118 Type 2 diabetes mellitus with unspecified complications: Secondary | ICD-10-CM | POA: Diagnosis present

## 2016-10-30 DIAGNOSIS — Z8249 Family history of ischemic heart disease and other diseases of the circulatory system: Secondary | ICD-10-CM

## 2016-10-30 DIAGNOSIS — R740 Nonspecific elevation of levels of transaminase and lactic acid dehydrogenase [LDH]: Secondary | ICD-10-CM | POA: Diagnosis present

## 2016-10-30 DIAGNOSIS — R748 Abnormal levels of other serum enzymes: Secondary | ICD-10-CM | POA: Diagnosis not present

## 2016-10-30 DIAGNOSIS — E875 Hyperkalemia: Secondary | ICD-10-CM | POA: Diagnosis present

## 2016-10-30 DIAGNOSIS — I255 Ischemic cardiomyopathy: Secondary | ICD-10-CM | POA: Diagnosis present

## 2016-10-30 DIAGNOSIS — R7989 Other specified abnormal findings of blood chemistry: Secondary | ICD-10-CM | POA: Diagnosis present

## 2016-10-30 HISTORY — DX: Atherosclerotic heart disease of native coronary artery without angina pectoris: I25.10

## 2016-10-30 LAB — I-STAT CHEM 8, ED
BUN: 121 mg/dL — AB (ref 6–20)
Calcium, Ion: 1.09 mmol/L — ABNORMAL LOW (ref 1.15–1.40)
Chloride: 99 mmol/L — ABNORMAL LOW (ref 101–111)
Creatinine, Ser: 7.1 mg/dL — ABNORMAL HIGH (ref 0.61–1.24)
Glucose, Bld: 208 mg/dL — ABNORMAL HIGH (ref 65–99)
HCT: 26 % — ABNORMAL LOW (ref 39.0–52.0)
HEMOGLOBIN: 8.8 g/dL — AB (ref 13.0–17.0)
Potassium: 5.4 mmol/L — ABNORMAL HIGH (ref 3.5–5.1)
SODIUM: 134 mmol/L — AB (ref 135–145)
TCO2: 22 mmol/L (ref 0–100)

## 2016-10-30 LAB — I-STAT ARTERIAL BLOOD GAS, ED
ACID-BASE DEFICIT: 3 mmol/L — AB (ref 0.0–2.0)
Acid-base deficit: 4 mmol/L — ABNORMAL HIGH (ref 0.0–2.0)
BICARBONATE: 20.4 mmol/L (ref 20.0–28.0)
Bicarbonate: 20.8 mmol/L (ref 20.0–28.0)
O2 SAT: 69 %
O2 Saturation: 90 %
PCO2 ART: 33.4 mmHg (ref 32.0–48.0)
PH ART: 7.404 (ref 7.350–7.450)
Patient temperature: 98.7
Patient temperature: 98.8
TCO2: 21 mmol/L (ref 0–100)
TCO2: 22 mmol/L (ref 0–100)
pCO2 arterial: 31.7 mmHg — ABNORMAL LOW (ref 32.0–48.0)
pH, Arterial: 7.416 (ref 7.350–7.450)
pO2, Arterial: 36 mmHg — CL (ref 83.0–108.0)
pO2, Arterial: 57 mmHg — ABNORMAL LOW (ref 83.0–108.0)

## 2016-10-30 LAB — CBC WITH DIFFERENTIAL/PLATELET
BASOS PCT: 1 %
Basophils Absolute: 0.1 10*3/uL (ref 0.0–0.1)
EOS ABS: 1.5 10*3/uL — AB (ref 0.0–0.7)
EOS PCT: 16 %
HCT: 26.4 % — ABNORMAL LOW (ref 39.0–52.0)
Hemoglobin: 8.7 g/dL — ABNORMAL LOW (ref 13.0–17.0)
LYMPHS ABS: 0.8 10*3/uL (ref 0.7–4.0)
Lymphocytes Relative: 9 %
MCH: 29.6 pg (ref 26.0–34.0)
MCHC: 33 g/dL (ref 30.0–36.0)
MCV: 89.8 fL (ref 78.0–100.0)
MONO ABS: 0.8 10*3/uL (ref 0.1–1.0)
MONOS PCT: 9 %
Neutro Abs: 6.2 10*3/uL (ref 1.7–7.7)
Neutrophils Relative %: 65 %
PLATELETS: 163 10*3/uL (ref 150–400)
RBC: 2.94 MIL/uL — ABNORMAL LOW (ref 4.22–5.81)
RDW: 13.8 % (ref 11.5–15.5)
WBC: 9.3 10*3/uL (ref 4.0–10.5)

## 2016-10-30 LAB — I-STAT CG4 LACTIC ACID, ED
Lactic Acid, Venous: 1.01 mmol/L (ref 0.5–1.9)
Lactic Acid, Venous: 0.84 mmol/L (ref 0.5–1.9)

## 2016-10-30 LAB — COMPREHENSIVE METABOLIC PANEL
ALBUMIN: 3.4 g/dL — AB (ref 3.5–5.0)
ALK PHOS: 104 U/L (ref 38–126)
ALT: 87 U/L — AB (ref 17–63)
AST: 78 U/L — ABNORMAL HIGH (ref 15–41)
Anion gap: 15 (ref 5–15)
BUN: 127 mg/dL — AB (ref 6–20)
CO2: 19 mmol/L — AB (ref 22–32)
CREATININE: 6.04 mg/dL — AB (ref 0.61–1.24)
Calcium: 9 mg/dL (ref 8.9–10.3)
Chloride: 99 mmol/L — ABNORMAL LOW (ref 101–111)
GFR calc Af Amer: 11 mL/min — ABNORMAL LOW (ref >60)
GFR calc non Af Amer: 9 mL/min — ABNORMAL LOW (ref >60)
GLUCOSE: 202 mg/dL — AB (ref 65–99)
Potassium: 5.5 mmol/L — ABNORMAL HIGH (ref 3.5–5.1)
SODIUM: 133 mmol/L — AB (ref 135–145)
Total Bilirubin: 0.7 mg/dL (ref 0.3–1.2)
Total Protein: 7 g/dL (ref 6.5–8.1)

## 2016-10-30 LAB — AMMONIA: AMMONIA: 14 umol/L (ref 9–35)

## 2016-10-30 LAB — CBG MONITORING, ED: Glucose-Capillary: 203 mg/dL — ABNORMAL HIGH (ref 65–99)

## 2016-10-30 LAB — ETHANOL: Alcohol, Ethyl (B): <5 mg/dL (ref <5)

## 2016-10-30 MED ORDER — NALOXONE HCL 2 MG/2ML IJ SOSY
1.0000 mg | PREFILLED_SYRINGE | INTRAMUSCULAR | Status: DC | PRN
  Filled 2016-10-30 (×2): qty 2

## 2016-10-30 NOTE — Progress Notes (Signed)
RT NOTE:  RT attempted 2x to draw arterial blood without success. Sample resulted are venous in ISTAT. MD aware. Charge RT called to attempt to collect.

## 2016-10-30 NOTE — ED Notes (Signed)
Pt answering to voice able to communicate more appropriately. Appears more alert.

## 2016-10-30 NOTE — ED Notes (Signed)
XR at bedside

## 2016-10-30 NOTE — ED Notes (Signed)
Pt with episode of apnea and pulselessness x 4 seconds. Pt regained peripheral pulses after being stimulated. EDP aware.

## 2016-10-30 NOTE — ED Triage Notes (Signed)
Pt presents EMS from home. Family states patient has been different since noon today. Pt does not speak english. Pt has an internal ECCO. Can feel thrill in left upper arm.

## 2016-10-30 NOTE — ED Notes (Signed)
EDP at bedside  

## 2016-10-30 NOTE — ED Notes (Signed)
ED Provider at bedside. 

## 2016-10-30 NOTE — ED Provider Notes (Signed)
[facility] DEPT Provider Note   CSN: [id number] Arrival date & time: 10/30/16  2010     History   Chief Complaint No chief complaint on file.   HPI Kenneth [name] is a 61 y.o. male with history of CHF, DM on insulin pump unable to provide history.   Patient's wife states that he has been acting different since noon today. She states that he has been worsening since then with increased respirations, abdominal pain, and swelling of legs. She she admits to him having nonproductive cough for several months She states that he is full code. She states that he has had chills and nausea. She states that he has not had any vomiting, fevers, changes in urinary symptoms, changes in bowel movements. She states that he was taken off dialysis in December and recently saw a nephrologist last week who recommended that he be back on dialysis starting Monday. She states that he was seen by a family practice provider yesterday and did not have any symptoms. She states that he does make urine.  Level 5 caveat  The history is provided by the spouse. The history is limited by the condition of the patient. A language interpreter was used.    Past Medical History:  Diagnosis Date  . Acute pulmonary edema ([place]) 05/31/2016  . Acute respiratory failure with hypoxemia ([place]) 05/31/2016  . AKI (acute kidney injury) ([place])   . CHF (congestive heart failure) ([place])   . 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 HCAP (healthcare-associated pneumonia)   . Ischemic rest pain of lower extremity ([place]) 02/06/2015  . 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 NSTEMI (non-ST elevated myocardial infarction) ([place])   . Pain in lower limb 02/06/2015  . Pain in the chest   . Pleural effusion   . 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 Shortness of breath     Patient Active Problem List   Diagnosis Date Noted  . DM (diabetes mellitus), type 2 with renal complications ([place]) 40/98/1191  . Elevated  troponin 10/31/2016  . ESRD (end stage renal disease) ([place]) 10/30/2016  . Acute encephalopathy 10/30/2016  . Uremia 10/30/2016  . CAD (coronary artery disease) 10/30/2016  . 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 CHF (congestive heart failure) ([place])   . AKI (acute kidney injury) ([place])   . HCAP (healthcare-associated pneumonia)   . S/P thoracentesis   . Pleural effusion   . Acute pulmonary edema ([place]) 05/31/2016  . Acute respiratory failure with hypoxemia ([place]) 05/31/2016  . NSTEMI (non-ST elevated myocardial infarction) ([place])   . Shortness of breath   . Pain in the chest   . 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 Pain in lower limb 02/06/2015  . Ischemic rest pain of lower extremity ([place]) 02/06/2015    Past Surgical History:  Procedure Laterality Date  . AV FISTULA PLACEMENT Left 06/22/2016   Procedure: ARTERIOVENOUS (AV) FISTULA CREATION LEFT UPPER ARM;  Surgeon: Rosetta Posner, MD;  Location: [facility];  Service: Vascular;  Laterality: Left;  . CARDIAC CATHETERIZATION N/A 05/31/2016   Procedure: Left Heart Cath and Coronary Angiography;  Surgeon: Jettie Booze, MD;  Location: [facility];  Service: Cardiovascular;  Laterality: N/A;  . CARDIAC CATHETERIZATION N/A 05/31/2016   Procedure: Right Heart Cath;  Surgeon: Jettie Booze, MD;  Location: [facility];  Service: Cardiovascular;  Laterality: N/A;  .  CARDIAC CATHETERIZATION N/A 05/31/2016   Procedure: IABP Insertion;  Surgeon: Jettie Booze, MD;  Location: [facility];  Service: Cardiovascular;  Laterality: N/A;  . CORONARY ARTERY BYPASS GRAFT N/A 06/05/2016   Procedure: CORONARY ARTERY BYPASS GRAFTING (CABG) x3 LIMA to LAD -SVG to OM -SVG to RCA;  Surgeon: Ivin Poot, MD;  Location: [facility];  Service: Open Heart Surgery;  Laterality: N/A;  . INSERTION OF DIALYSIS CATHETER N/A 06/05/2016   Procedure:  INSERTION OF DIALYSIS/trialysis CATHETER;  Surgeon: Ivin Poot, MD;  Location: [facility];  Service: Vascular;  Laterality: N/A;  . INSERTION OF DIALYSIS CATHETER Right 06/13/2016   Procedure: INSERTION OF DIALYSIS CATHETER RIGHT INTERNAL JUGULAR;  Surgeon: Conrad [place], MD;  Location: [facility];  Service: Vascular;  Laterality: Right;  . INTRAOPERATIVE TRANSESOPHAGEAL ECHOCARDIOGRAM N/A 06/05/2016   Procedure: INTRAOPERATIVE TRANSESOPHAGEAL ECHOCARDIOGRAM;  Surgeon: Ivin Poot, MD;  Location: [facility];  Service: Open Heart Surgery;  Laterality: N/A;       Home Medications    Prior to Admission medications   Medication Sig Start Date End Date Taking? Authorizing Provider  albuterol (PROVENTIL HFA;VENTOLIN HFA) 108 (90 Base) MCG/ACT inhaler Inhale 2 puffs into the lungs every 6 (six) hours as needed for wheezing.   Yes Historical Provider, MD  aspirin EC 325 MG EC tablet Take 1 tablet (325 mg total) by mouth daily. 06/23/16  Yes Elgie Collard, PA-C  atorvastatin (LIPITOR) 80 MG tablet Take 1 tablet (80 mg total) by mouth daily. 09/22/16 12/21/16 Yes Larey Dresser, MD  carvedilol (COREG) 6.25 MG tablet Take 1 tablet (6.25 mg total) by mouth 2 (two) times daily with a meal. 09/22/16  Yes Larey Dresser, MD  cefUROXime (CEFTIN) 250 MG tablet Take 250 mg by mouth 2 (two) times daily. 10/22/16 11/01/16 Yes Historical Provider, MD  citalopram (CELEXA) 20 MG tablet Take 1 tablet (20 mg total) by mouth daily. 06/23/16  Yes Elgie Collard, PA-C  ezetimibe (ZETIA) 10 MG tablet Take 1 tablet (10 mg total) by mouth daily. 09/22/16 12/21/16 Yes Larey Dresser, MD  furosemide (LASIX) 40 MG tablet Take 1.5 tablets (60 mg total) by mouth 2 (two) times daily. Patient taking differently: Take 80 mg by mouth 2 (two) times daily.  10/19/16 01/17/17 Yes Larey Dresser, MD  hydrALAZINE (APRESOLINE) 50 MG tablet Take 1.5 tablets (75 mg total) by mouth 2 (two) times daily. 10/19/16  Yes Larey Dresser, MD  Insulin Human (INSULIN PUMP)  SOLN Inject into the skin continuous. Novolog   Yes Historical Provider, MD  Multiple Vitamin (MULTIVITAMIN WITH MINERALS) TABS tablet Take 1 tablet by mouth at bedtime.   Yes Historical Provider, MD  promethazine-codeine (PHENERGAN WITH CODEINE) 6.25-10 MG/5ML syrup Take 5 mLs by mouth every 6 (six) hours as needed for cough. 10/22/16  Yes Historical Provider, MD    Family History Family History  Problem Relation Age of Onset  . CAD Father   . Colon cancer Father   . Diabetes Brother     Social History Social History  Substance Use Topics  . Smoking status: Former Research scientist (life sciences)  . Smokeless tobacco: Never Used  . Alcohol use No     Allergies   Hydralazine   Review of Systems Review of Systems  Unable to perform ROS: Acuity of condition     Physical Exam Updated Vital Signs BP 147/57   Pulse (!) 57   Temp 98.2 F (36.8 C) (Oral)   Resp  20   SpO2 99%   Physical Exam  Constitutional: He is oriented to person, place, and time.  Lethargic on initial assessment. More alert and active on reassessment.   HENT:  Head: Normocephalic and atraumatic.  Nose: Nose normal.  Eyes: EOM are normal. Pupils are equal, round, and reactive to light.  Neck: Normal range of motion.  Cardiovascular: Normal rate, normal heart sounds and intact distal pulses.   Pulmonary/Chest: Breath sounds normal. Tachypnea noted. No respiratory distress. He has no wheezes. He has no rales.  Patient is tachypneic on assessment. No wheezing or rales anteriorly noted on exam.  No respiratory distress.  Abdominal: There is no tenderness. There is no rebound and no guarding.  Neurological: He is alert and oriented to person, place, and time.  Negative Brudzinski sign. Negative Kernig sign.  Skin: Skin is warm.  Nursing note and vitals reviewed.    ED Treatments / Results  Labs (all labs ordered are listed, but only abnormal results are displayed) Labs Reviewed  COMPREHENSIVE METABOLIC PANEL - Abnormal;  Notable for the following:       Result Value   Sodium 133 (*)    Potassium 5.5 (*)    Chloride 99 (*)    CO2 19 (*)    Glucose, Bld 202 (*)    BUN 127 (*)    Creatinine, Ser 6.04 (*)    Albumin 3.4 (*)    AST 78 (*)    ALT 87 (*)    GFR calc non Af Amer 9 (*)    GFR calc Af Amer 11 (*)    All other components within normal limits  CBC WITH DIFFERENTIAL/PLATELET - Abnormal; Notable for the following:    RBC 2.94 (*)    Hemoglobin 8.7 (*)    HCT 26.4 (*)    Eosinophils Absolute 1.5 (*)    All other components within normal limits  URINALYSIS, ROUTINE W REFLEX MICROSCOPIC - Abnormal; Notable for the following:    Protein, ur 100 (*)    Bacteria, UA RARE (*)    All other components within normal limits  RAPID URINE DRUG SCREEN, HOSP PERFORMED - Abnormal; Notable for the following:    Opiates POSITIVE (*)    All other components within normal limits  BRAIN NATRIURETIC PEPTIDE - Abnormal; Notable for the following:    B Natriuretic Peptide 1,900.0 (*)    All other components within normal limits  TROPONIN I - Abnormal; Notable for the following:    Troponin I 0.08 (*)    All other components within normal limits  BASIC METABOLIC PANEL - Abnormal; Notable for the following:    Chloride 100 (*)    CO2 16 (*)    Glucose, Bld 233 (*)    BUN 128 (*)    Creatinine, Ser 6.00 (*)    Calcium 8.8 (*)    GFR calc non Af Amer 9 (*)    GFR calc Af Amer 11 (*)    Anion gap 19 (*)    All other components within normal limits  CBG MONITORING, ED - Abnormal; Notable for the following:    Glucose-Capillary 203 (*)    All other components within normal limits  I-STAT CHEM 8, ED - Abnormal; Notable for the following:    Sodium 134 (*)    Potassium 5.4 (*)    Chloride 99 (*)    BUN 121 (*)    Creatinine, Ser 7.10 (*)    Glucose, Bld 208 (*)  Calcium, Ion 1.09 (*)    Hemoglobin 8.8 (*)    HCT 26.0 (*)    All other components within normal limits  I-STAT ARTERIAL BLOOD GAS, ED -  Abnormal; Notable for the following:    pO2, Arterial 36.0 (*)    Acid-base deficit 3.0 (*)    All other components within normal limits  I-STAT ARTERIAL BLOOD GAS, ED - Abnormal; Notable for the following:    pCO2 arterial 31.7 (*)    pO2, Arterial 57.0 (*)    Acid-base deficit 4.0 (*)    All other components within normal limits  CULTURE, BLOOD (ROUTINE X 2)  CULTURE, BLOOD (ROUTINE X 2)  AMMONIA  ETHANOL  TROPONIN I  TROPONIN I  TROPONIN I  RENAL FUNCTION PANEL  CBC  I-STAT CG4 LACTIC ACID, ED  I-STAT TROPOININ, ED  I-STAT CG4 LACTIC ACID, ED    EKG  EKG Interpretation  Date/Time:  Friday October 30 2016 20:17:57 EST Ventricular Rate:  62 PR Interval:    QRS Duration: 110 QT Interval:  461 QTC Calculation: 469 R Axis:   94 Text Interpretation:  Sinus rhythm Consider left atrial enlargement Borderline right axis deviation Repol abnrm suggests ischemia, lateral leads No significant change since last tracing Confirmed by [facility] MD, PEDRO ([id number]) on 10/30/2016 8:37:06 PM       Radiology Ct Head Wo Contrast  Result Date: 10/30/2016 CLINICAL DATA:  61 y/o  M; altered mental status. EXAM: CT HEAD WITHOUT CONTRAST TECHNIQUE: Contiguous axial images were obtained from the base of the skull through the vertex without intravenous contrast. COMPARISON:  None. FINDINGS: Brain: No evidence of acute infarction, hemorrhage, hydrocephalus, extra-axial collection or mass lesion/mass effect. Vascular: Calcific atherosclerosis of cavernous and paraclinoid internal carotid arteries. Skull: Normal. Negative for fracture or focal lesion. Sinuses/Orbits: No acute finding. Other: None. IMPRESSION: 1. No acute intracranial abnormality identified. 2. Unremarkable CT of the head for age. Electronically Signed   By: Kristine Garbe M.D.   On: 10/30/2016 22:12   Dg Chest Port 1 View  Result Date: 10/30/2016 CLINICAL DATA:  61 y/o  M; altered mental status. EXAM: PORTABLE CHEST 1 VIEW  COMPARISON:  07/14/2016 chest radiograph FINDINGS: Stable cardiac silhouette given projection and technique. Aortic atherosclerosis with calcification. Sternotomy wires are aligned. Transcutaneous pacers noted. Diffuse hazy opacification of the lungs probably represents alveolar pulmonary edema. No acute osseous abnormality. IMPRESSION: Moderate alveolar pulmonary edema. Stable cardiac silhouette. Aortic atherosclerosis. Electronically Signed   By: Kristine Garbe M.D.   On: 10/30/2016 21:24    Procedures Procedures (including critical care time)  Medications Ordered in ED Medications  naloxone (NARCAN) injection 1 mg (not administered)  pentafluoroprop-tetrafluoroeth (GEBAUERS) aerosol 1 application (not administered)  lidocaine (PF) (XYLOCAINE) 1 % injection 5 mL (not administered)  lidocaine-prilocaine (EMLA) cream 1 application (not administered)  0.9 %  sodium chloride infusion (not administered)  0.9 %  sodium chloride infusion (not administered)  heparin injection 1,000 Units (not administered)  alteplase (CATHFLO ACTIVASE) injection 2 mg (not administered)     Initial Impression / Assessment and Plan / ED Course  I have reviewed the triage vital signs and the nursing notes.  Pertinent labs & imaging results that were available during my care of the patient were reviewed by me and considered in my medical decision making (see chart for details).    Patient with uremia and likely needed dialysis treatment.  He presents today with worsening lethargy starting at noon today. He was a poor historian  on initial assessment. His vital signs are stable, afebrile. He presented with shortness of breath, tachypnea. Patient shown to have increased creatinine 6, increased BUN to 128, uremia, pulmonary edema. First CMP and chem 8 hemolyzed and not most accurate number for potassium. Repeat blood work showed potassium at 4.8. CT of head is negative. Patient's symptoms improved here in Ed and  became more alert and responsive. I spoke with [facility] who agreed to see patient tonight and have patient dialyzed tomorrow morning and did not think patient needed emergent dialysis. I spoke with hospitalist, Dr. Roel Cluck, who agreed to have patient admitted for further evaluation and follow up.   Final Clinical Impressions(s) / ED Diagnoses   Final diagnoses:  Uremia    New Prescriptions New Prescriptions   No medications on file     [place], [place] 10/31/16 [place], MD 11/01/16 [phone]

## 2016-10-30 NOTE — ED Notes (Signed)
Family at bedside. 

## 2016-10-30 NOTE — ED Notes (Signed)
RT at bedside.

## 2016-10-30 NOTE — H&P (Signed)
Kenneth [name] VFI:[id number] DOB: Jul 30, 1956 DOA: 10/30/2016     PCP: [name], MD   Outpatient Specialists:  Cardiology [facility] Patient coming from:  home Lives With family    Chief Complaint: Somnolence and confusion  HPI: Kenneth [name] is a 60 y.o. male with medical history significant of type II diabetes, CKD stage IV requiring HD for a period of time, CAD, and chronic systolic CHF NSEMI September 2017    Presented with worsening somnolence and confusion he have had progressive leg edema for past few weeks has been seen by nephrology who tried to increase his Lasix. His nephrologist has told him that he'll probably will need to restart his hemodialysis the plan was to do so on Monday. Has access in place. Been having abdominal swelling increased respirations have been having some nonproductive cough but this been going on for months some chills and nausea no vomiting no fevers he have had decreased urinary output today had not urinated at all.  As dialysis was in December Regarding pertinent Chronic problems: hx of CAD LHC 3 vessel disease. Requiring CABG Post CABG he developed atrial fibrillation but that was only isolated episodes CKD stage IV transiently requiring hemodialysis post CABG cardiogenic shock requiring milrinone drip, last echogram in September 2017 showed EF 35-40 percent repeat echogram in January 2018 showed EF improved to 60-65 percent Significant hypertension recently had his Lasix increased as well as his hydralazine his blood pressure has been trending up since his dialysis has been discontinued.  IN ER:  Temp (24hrs), Avg:98.8 F (37.1 C), Min:98.8 F (37.1 C), Max:98.8 F (37.1 C)     RR 21 97% Hr 61 152/62  Lactic acid 0.84 7.416/31/57 Ammonia 14 Lactic 1.01 Na 134 K 5.4 BUN 121 Cr 7.1 WBC 9.3 Hg 8.8 CT head negative CXR pulmonary edema  Following Medications were ordered in ER: Medications  naloxone (NARCAN) injection 1 mg (not administered)      ER provider discussed case with: nephrology who is aware of the patient will dialyze in a.m.  Hospitalist was called for admission for uremia pulmonary edema in a setting of severe chronic kidney disease requiring urgent dialysis  Review of Systems:    Pertinent positives include:  chills, fatigue, shortness of breath at rest, dyspnea on exertion,  Constitutional:  No weight loss, night sweats, Fevers,weight loss  HEENT:  No headaches, Difficulty swallowing,Tooth/dental problems,Sore throat,  No sneezing, itching, ear ache, nasal congestion, post nasal drip,  Cardio-vascular:  No chest pain, Orthopnea, PND, anasarca, dizziness, palpitations.no Bilateral lower extremity swelling  GI:  No heartburn, indigestion, abdominal pain, nausea, vomiting, diarrhea, change in bowel habits, loss of appetite, melena, blood in stool, hematemesis Resp:  no . No  No excess mucus, no productive cough, No non-productive cough, No coughing up of blood.No change in color of mucus.No wheezing. Skin:  no rash or lesions. No jaundice GU:  no dysuria, change in color of urine, no urgency or frequency. No straining to urinate.  No flank pain.  Musculoskeletal:  No joint pain or no joint swelling. No decreased range of motion. No back pain.  Psych:  No change in mood or affect. No depression or anxiety. No memory loss.  Neuro: no localizing neurological complaints, no tingling, no weakness, no double vision, no gait abnormality, no slurred speech, no confusion  As per HPI otherwise 10 point review of systems negative.   Past Medical History: Past Medical History:  Diagnosis Date  . Acute  pulmonary edema ([place]) 05/31/2016  . Acute respiratory failure with hypoxemia ([place]) 05/31/2016  . AKI (acute kidney injury) ([place])   . CHF (congestive heart failure) ([place])   . 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 HCAP (healthcare-associated pneumonia)   . Ischemic rest pain of lower extremity ([place]) 02/06/2015   . 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 NSTEMI (non-ST elevated myocardial infarction) ([place])   . Pain in lower limb 02/06/2015  . Pain in the chest   . Pleural effusion   . 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 Shortness of breath    Past Surgical History:  Procedure Laterality Date  . AV FISTULA PLACEMENT Left 06/22/2016   Procedure: ARTERIOVENOUS (AV) FISTULA CREATION LEFT UPPER ARM;  Surgeon: Rosetta Posner, MD;  Location: [place];  Service: Vascular;  Laterality: Left;  . CARDIAC CATHETERIZATION N/A 05/31/2016   Procedure: Left Heart Cath and Coronary Angiography;  Surgeon: Jettie Booze, MD;  Location: [place] [facility];  Service: Cardiovascular;  Laterality: N/A;  . CARDIAC CATHETERIZATION N/A 05/31/2016   Procedure: Right Heart Cath;  Surgeon: Jettie Booze, MD;  Location: [place] [facility];  Service: Cardiovascular;  Laterality: N/A;  . CARDIAC CATHETERIZATION N/A 05/31/2016   Procedure: IABP Insertion;  Surgeon: Jettie Booze, MD;  Location: [place] [facility];  Service: Cardiovascular;  Laterality: N/A;  . CORONARY ARTERY BYPASS GRAFT N/A 06/05/2016   Procedure: CORONARY ARTERY BYPASS GRAFTING (CABG) x3 LIMA to LAD -SVG to OM -SVG to RCA;  Surgeon: Ivin Poot, MD;  Location: [place];  Service: Open Heart Surgery;  Laterality: N/A;  . INSERTION OF DIALYSIS CATHETER N/A 06/05/2016   Procedure: INSERTION OF DIALYSIS/trialysis CATHETER;  Surgeon: Ivin Poot, MD;  Location: [place];  Service: Vascular;  Laterality: N/A;  . INSERTION OF DIALYSIS CATHETER Right 06/13/2016   Procedure: INSERTION OF DIALYSIS CATHETER RIGHT INTERNAL JUGULAR;  Surgeon: Conrad Parkdale, MD;  Location: [place];  Service: Vascular;  Laterality: Right;  . INTRAOPERATIVE TRANSESOPHAGEAL ECHOCARDIOGRAM N/A 06/05/2016   Procedure: INTRAOPERATIVE TRANSESOPHAGEAL ECHOCARDIOGRAM;  Surgeon: Ivin Poot, MD;  Location: [place];  Service: Open Heart Surgery;  Laterality: N/A;      Social History:  Ambulatory  independently cane, walker  wheelchair bound, bed bound    reports that he has quit smoking. He has never used smokeless tobacco. His alcohol and drug histories are not on file.  Allergies:   Allergies  Allergen Reactions  . Hydralazine Rash       Family History:   Family History  Problem Relation Age of Onset  . CAD Father   . Colon cancer Father   . Diabetes Brother     Medications: Prior to Admission medications   Medication Sig Start Date End Date Taking? Authorizing Provider  albuterol (PROVENTIL HFA;VENTOLIN HFA) 108 (90 Base) MCG/ACT inhaler Inhale 2 puffs into the lungs every 6 (six) hours as needed for wheezing.   Yes Historical Provider, MD  aspirin EC 325 MG EC tablet Take 1 tablet (325 mg total) by mouth daily. 06/23/16  Yes Elgie Collard, PA-C  atorvastatin (LIPITOR) 80 MG tablet Take 1 tablet (80 mg total) by mouth daily. 09/22/16 12/21/16 Yes Larey Dresser, MD  carvedilol (COREG) 6.25 MG tablet Take 1 tablet (6.25 mg total) by mouth 2 (two) times daily with a meal. 09/22/16  Yes Larey Dresser, MD  cefUROXime (CEFTIN) 250 MG tablet Take 250 mg by mouth 2 (two)  times daily. 10/22/16 11/01/16 Yes Historical Provider, MD  citalopram (CELEXA) 20 MG tablet Take 1 tablet (20 mg total) by mouth daily. 06/23/16  Yes Elgie Collard, PA-C  ezetimibe (ZETIA) 10 MG tablet Take 1 tablet (10 mg total) by mouth daily. 09/22/16 12/21/16 Yes Larey Dresser, MD  furosemide (LASIX) 40 MG tablet Take 1.5 tablets (60 mg total) by mouth 2 (two) times daily. 10/19/16 01/17/17 Yes Larey Dresser, MD  hydrALAZINE (APRESOLINE) 50 MG tablet Take 1.5 tablets (75 mg total) by mouth 2 (two) times daily. 10/19/16  Yes Larey Dresser, MD  Insulin Human (INSULIN PUMP) SOLN Inject into the skin continuous. Novolog   Yes Historical Provider, MD  Multiple Vitamin (MULTIVITAMIN WITH MINERALS) TABS tablet Take 1 tablet by mouth at bedtime.   Yes Historical Provider, MD   promethazine-codeine (PHENERGAN WITH CODEINE) 6.25-10 MG/5ML syrup Take 5 mLs by mouth every 6 (six) hours as needed for cough. 10/22/16  Yes Historical Provider, MD    Physical Exam: Patient Vitals for the past 24 hrs:  BP Temp Temp src Pulse Resp SpO2  10/30/16 2300 152/62 - - 61 - 97 %  10/30/16 2245 147/56 - - 60 21 97 %  10/30/16 2230 165/67 - - 61 20 96 %  10/30/16 2215 167/63 - - 62 26 94 %  10/30/16 2203 - - - 63 11 95 %  10/30/16 2202 156/67 - - - - -  10/30/16 2130 (!) 172/51 - - 61 25 93 %  10/30/16 2115 172/74 - - 62 20 97 %  10/30/16 2100 166/66 - - 63 21 96 %  10/30/16 2049 181/65 98.8 F (37.1 C) Rectal 63 23 95 %  10/30/16 2016 176/64 - - 62 (!) 30 95 %  10/30/16 2015 176/64 - - 62 - 96 %  10/30/16 2014 - - - - - 100 %    1. General:  in No Acute distress 2. Psychological: somnolent not fully Oriented 3. Head/ENT:    Dry Mucous Membranes                          Head Non traumatic, neck supple                           NOrmal Dentition 4. SKIN: normal  Skin turgor,  Skin clean Dry and intact no rash 5. Heart: Regular rate and rhythm no Murmur, Rub or gallop 6. Lungs:   no wheezes mild crackles   7. Abdomen: Soft,  non-tender, Non distended, insuline pump in place 8. Lower extremities: no clubbing, cyanosis, or edema 9. Neurologically Grossly intact, moving all 4 extremities equally  Able to follow simple commands 10. MSK: Normal range of motion   body mass index is unknown because there is no height or weight on file.  Labs on Admission:   Labs on Admission: I have personally reviewed following labs and imaging studies  CBC:  Recent Labs Lab 10/30/16 2024 10/30/16 2048  WBC 9.3  --   NEUTROABS 6.2  --   HGB 8.7* 8.8*  HCT 26.4* 26.0*  MCV 89.8  --   PLT 163  --    Basic Metabolic Panel:  Recent Labs Lab 10/30/16 2024 10/30/16 2048  NA 133* 134*  K 5.5* 5.4*  CL 99* 99*  CO2 19*  --   GLUCOSE 202* 208*  BUN 127* 121*  CREATININE 6.04*  7.10*  CALCIUM 9.0  --    GFR: Estimated Creatinine Clearance: 10.1 mL/min (by C-G formula based on SCr of 7.1 mg/dL (H)). Liver Function Tests:  Recent Labs Lab 10/30/16 2024  AST 78*  ALT 87*  ALKPHOS 104  BILITOT 0.7  PROT 7.0  ALBUMIN 3.4*   No results for input(s): LIPASE, AMYLASE in the last 168 hours.  Recent Labs Lab 10/30/16 2059  AMMONIA 14   Coagulation Profile: No results for input(s): INR, PROTIME in the last 168 hours. Cardiac Enzymes: No results for input(s): CKTOTAL, CKMB, CKMBINDEX, TROPONINI in the last 168 hours. BNP (last 3 results) No results for input(s): PROBNP in the last 8760 hours. HbA1C: No results for input(s): HGBA1C in the last 72 hours. CBG:  Recent Labs Lab 10/30/16 2032  GLUCAP 203*   Lipid Profile: No results for input(s): CHOL, HDL, LDLCALC, TRIG, CHOLHDL, LDLDIRECT in the last 72 hours. Thyroid Function Tests: No results for input(s): TSH, T4TOTAL, FREET4, T3FREE, THYROIDAB in the last 72 hours. Anemia Panel: No results for input(s): VITAMINB12, FOLATE, FERRITIN, TIBC, IRON, RETICCTPCT in the last 72 hours.  Sepsis Labs: @LABRCNTIP (procalcitonin:4,lacticidven:4) )No results found for this or any previous visit (from the past 240 hour(s)).     UA anuric  Lab Results  Component Value Date   HGBA1C 10.4 (H) 06/04/2016    Estimated Creatinine Clearance: 10.1 mL/min (by C-G formula based on SCr of 7.1 mg/dL (H)).  BNP (last 3 results) No results for input(s): PROBNP in the last 8760 hours.   ECG REPORT  Independently reviewed Rate:62  Rhythm: R axis deviation SR ST&T Change: ST depression QTC 469  There were no vitals filed for this visit.   Cultures:    Component Value Date/Time   SDES THORACENTESIS 06/02/2016 1101   SDES THORACENTESIS 06/02/2016 1101   SPECREQUEST NONE 06/02/2016 1101   SPECREQUEST NONE 06/02/2016 1101   CULT NO GROWTH 5 DAYS 06/02/2016 1101   REPTSTATUS 06/02/2016 FINAL 06/02/2016 1101     REPTSTATUS 06/07/2016 FINAL 06/02/2016 1101     Radiological Exams on Admission: Ct Head Wo Contrast  Result Date: 10/30/2016 CLINICAL DATA:  61 y/o  M; altered mental status. EXAM: CT HEAD WITHOUT CONTRAST TECHNIQUE: Contiguous axial images were obtained from the base of the skull through the vertex without intravenous contrast. COMPARISON:  None. FINDINGS: Brain: No evidence of acute infarction, hemorrhage, hydrocephalus, extra-axial collection or mass lesion/mass effect. Vascular: Calcific atherosclerosis of cavernous and paraclinoid internal carotid arteries. Skull: Normal. Negative for fracture or focal lesion. Sinuses/Orbits: No acute finding. Other: None. IMPRESSION: 1. No acute intracranial abnormality identified. 2. Unremarkable CT of the head for age. Electronically Signed   By: Kristine Garbe M.D.   On: 10/30/2016 22:12   Dg Chest Port 1 View  Result Date: 10/30/2016 CLINICAL DATA:  61 y/o  M; altered mental status. EXAM: PORTABLE CHEST 1 VIEW COMPARISON:  07/14/2016 chest radiograph FINDINGS: Stable cardiac silhouette given projection and technique. Aortic atherosclerosis with calcification. Sternotomy wires are aligned. Transcutaneous pacers noted. Diffuse hazy opacification of the lungs probably represents alveolar pulmonary edema. No acute osseous abnormality. IMPRESSION: Moderate alveolar pulmonary edema. Stable cardiac silhouette. Aortic atherosclerosis. Electronically Signed   By: Kristine Garbe M.D.   On: 10/30/2016 21:24    Chart has been reviewed    Assessment/Plan   61 y.o. male with medical history significant of type II diabetes, CKD stage IV requiring HD for a period of time, CAD, and chronic systolic CHF NSEMI September 2017 admitted for  Uremia with plant to dialize early in AM  Present on Admission: . ESRD (end stage renal disease) [facility]) appreciate nephrology assistance plan to dialyze in a.m. Hyperkalemia mild discussed her nephrology sample  has been h hemolyzed. . Acute encephalopathy likely secondary to uremia . Uremia hemodialysis in the morning . Acute pulmonary edema (HCC) in the setting of end-stage renal disease hemodialysis planned for early morning currently admit to step down . CAD (coronary artery disease) continue statin aspirin and   restart Coreg if able to tolerate rate . DM (diabetes mellitus), type 2 with renal complications (HCC) - discontinue insulin pump patient unable to control at that this point family does not know the settings. For now will cover with sliding scale. Once patient is more alert we'll need to clarify settings on the pump to figure out what's the basal dose of insulin. Para notes in January patient has been on schedule 3 times a day 13 units. He does not have endocrinologist. Will order  diabetic coordinator consult . Elevated troponin- in a setting of end-stage renal disease but patient didn't endorse intermittent chest pain early in the day we'll continue to cycle cardiac enzymes and currently chest pain free order echogram to evaluate for any wall motion abnormality   Other plan as per orders.  DVT prophylaxis:  heprin   Code Status:  FULL CODE  as per  family   Family Communication:   Family at  Bedside  plan of care was discussed with  Wife   Disposition Plan:    To home once workup is complete and patient is stable                        Would benefit from PT/OT eval prior to DC   ordered                   Diabetes coordinator      consulted                          Consults called: Nephrology     Admission status:    inpatient     Level of care    SDU      I have spent a total of 57 min on this admission  Jaxson Keener 10/31/2016, 12:52 AM    [facility]  Pager [phone]   after 2 AM please page floor coverage PA If 7AM-7PM, please contact the day team taking care of the patient  [email or web address]  Password [email or web address]

## 2016-10-30 NOTE — ED Notes (Signed)
Using pacific interpreter to communicate with patient and family. When pt asked his name and date of birth, pt did not respond.  When prompted by his wife, pt still did not answer. UTA orientation.

## 2016-10-30 NOTE — ED Notes (Signed)
Pt O2 sat at 90% on RA. Placed on 2L [place]. O2 sat increased to 95%

## 2016-10-30 NOTE — ED Notes (Signed)
Dr. Cardama at bedside.  

## 2016-10-30 NOTE — ED Notes (Addendum)
Pt transported to CT with this nurse. Pt returned to room from Ct. While in CT, pt had apneic episode lasting approx 6 secs, pt aroused to sternal rub with pulses 2+

## 2016-10-30 NOTE — ED Notes (Signed)
Pt attempted to give urine twice but states he "feels like he has to go" but is unable.

## 2016-10-31 ENCOUNTER — Encounter (HOSPITAL_COMMUNITY): Payer: Self-pay | Admitting: Internal Medicine

## 2016-10-31 DIAGNOSIS — Z992 Dependence on renal dialysis: Secondary | ICD-10-CM | POA: Diagnosis not present

## 2016-10-31 DIAGNOSIS — E1151 Type 2 diabetes mellitus with diabetic peripheral angiopathy without gangrene: Secondary | ICD-10-CM | POA: Diagnosis present

## 2016-10-31 DIAGNOSIS — Z794 Long term (current) use of insulin: Secondary | ICD-10-CM | POA: Diagnosis not present

## 2016-10-31 DIAGNOSIS — I480 Paroxysmal atrial fibrillation: Secondary | ICD-10-CM | POA: Diagnosis not present

## 2016-10-31 DIAGNOSIS — I5042 Chronic combined systolic (congestive) and diastolic (congestive) heart failure: Secondary | ICD-10-CM | POA: Diagnosis present

## 2016-10-31 DIAGNOSIS — L7632 Postprocedural hematoma of skin and subcutaneous tissue following other procedure: Secondary | ICD-10-CM | POA: Diagnosis not present

## 2016-10-31 DIAGNOSIS — L8990 Pressure ulcer of unspecified site, unspecified stage: Secondary | ICD-10-CM | POA: Diagnosis not present

## 2016-10-31 DIAGNOSIS — N186 End stage renal disease: Secondary | ICD-10-CM | POA: Diagnosis present

## 2016-10-31 DIAGNOSIS — K8020 Calculus of gallbladder without cholecystitis without obstruction: Secondary | ICD-10-CM | POA: Diagnosis present

## 2016-10-31 DIAGNOSIS — R7989 Other specified abnormal findings of blood chemistry: Secondary | ICD-10-CM | POA: Diagnosis present

## 2016-10-31 DIAGNOSIS — Z79899 Other long term (current) drug therapy: Secondary | ICD-10-CM | POA: Diagnosis not present

## 2016-10-31 DIAGNOSIS — Z833 Family history of diabetes mellitus: Secondary | ICD-10-CM | POA: Diagnosis not present

## 2016-10-31 DIAGNOSIS — I132 Hypertensive heart and chronic kidney disease with heart failure and with stage 5 chronic kidney disease, or end stage renal disease: Secondary | ICD-10-CM | POA: Diagnosis present

## 2016-10-31 DIAGNOSIS — R748 Abnormal levels of other serum enzymes: Secondary | ICD-10-CM | POA: Diagnosis not present

## 2016-10-31 DIAGNOSIS — I959 Hypotension, unspecified: Secondary | ICD-10-CM | POA: Diagnosis not present

## 2016-10-31 DIAGNOSIS — E1122 Type 2 diabetes mellitus with diabetic chronic kidney disease: Secondary | ICD-10-CM | POA: Diagnosis present

## 2016-10-31 DIAGNOSIS — Z7982 Long term (current) use of aspirin: Secondary | ICD-10-CM | POA: Diagnosis not present

## 2016-10-31 DIAGNOSIS — N185 Chronic kidney disease, stage 5: Secondary | ICD-10-CM | POA: Diagnosis not present

## 2016-10-31 DIAGNOSIS — E118 Type 2 diabetes mellitus with unspecified complications: Secondary | ICD-10-CM | POA: Diagnosis present

## 2016-10-31 DIAGNOSIS — G9340 Encephalopathy, unspecified: Secondary | ICD-10-CM | POA: Diagnosis not present

## 2016-10-31 DIAGNOSIS — E8779 Other fluid overload: Secondary | ICD-10-CM | POA: Diagnosis present

## 2016-10-31 DIAGNOSIS — I5043 Acute on chronic combined systolic (congestive) and diastolic (congestive) heart failure: Secondary | ICD-10-CM | POA: Diagnosis not present

## 2016-10-31 DIAGNOSIS — G9349 Other encephalopathy: Secondary | ICD-10-CM | POA: Diagnosis present

## 2016-10-31 DIAGNOSIS — E875 Hyperkalemia: Secondary | ICD-10-CM | POA: Diagnosis present

## 2016-10-31 DIAGNOSIS — Z9641 Presence of insulin pump (external) (internal): Secondary | ICD-10-CM | POA: Diagnosis present

## 2016-10-31 DIAGNOSIS — I214 Non-ST elevation (NSTEMI) myocardial infarction: Secondary | ICD-10-CM | POA: Diagnosis not present

## 2016-10-31 DIAGNOSIS — N19 Unspecified kidney failure: Secondary | ICD-10-CM | POA: Diagnosis not present

## 2016-10-31 DIAGNOSIS — Z8249 Family history of ischemic heart disease and other diseases of the circulatory system: Secondary | ICD-10-CM | POA: Diagnosis not present

## 2016-10-31 DIAGNOSIS — I2510 Atherosclerotic heart disease of native coronary artery without angina pectoris: Secondary | ICD-10-CM | POA: Diagnosis present

## 2016-10-31 DIAGNOSIS — Y838 Other surgical procedures as the cause of abnormal reaction of the patient, or of later complication, without mention of misadventure at the time of the procedure: Secondary | ICD-10-CM | POA: Diagnosis not present

## 2016-10-31 DIAGNOSIS — N2581 Secondary hyperparathyroidism of renal origin: Secondary | ICD-10-CM | POA: Diagnosis present

## 2016-10-31 DIAGNOSIS — I252 Old myocardial infarction: Secondary | ICD-10-CM | POA: Diagnosis not present

## 2016-10-31 DIAGNOSIS — J810 Acute pulmonary edema: Secondary | ICD-10-CM | POA: Diagnosis not present

## 2016-10-31 DIAGNOSIS — R778 Other specified abnormalities of plasma proteins: Secondary | ICD-10-CM | POA: Diagnosis present

## 2016-10-31 LAB — RENAL FUNCTION PANEL
Albumin: 3.1 g/dL — ABNORMAL LOW (ref 3.5–5.0)
Anion gap: 14 (ref 5–15)
BUN: 128 mg/dL — AB (ref 6–20)
CHLORIDE: 99 mmol/L — AB (ref 101–111)
CO2: 22 mmol/L (ref 22–32)
CREATININE: 6.08 mg/dL — AB (ref 0.61–1.24)
Calcium: 9 mg/dL (ref 8.9–10.3)
GFR calc Af Amer: 11 mL/min — ABNORMAL LOW (ref >60)
GFR, EST NON AFRICAN AMERICAN: 9 mL/min — AB (ref >60)
Glucose, Bld: 198 mg/dL — ABNORMAL HIGH (ref 65–99)
Phosphorus: 5.7 mg/dL — ABNORMAL HIGH (ref 2.5–4.6)
Potassium: 4.7 mmol/L (ref 3.5–5.1)
Sodium: 135 mmol/L (ref 135–145)

## 2016-10-31 LAB — IRON AND TIBC
Iron: 30 ug/dL — ABNORMAL LOW (ref 45–182)
Saturation Ratios: 12 % — ABNORMAL LOW (ref 17.9–39.5)
TIBC: 246 ug/dL — ABNORMAL LOW (ref 250–450)
UIBC: 216 ug/dL

## 2016-10-31 LAB — URINALYSIS, ROUTINE W REFLEX MICROSCOPIC
BILIRUBIN URINE: NEGATIVE
GLUCOSE, UA: NEGATIVE mg/dL
Hgb urine dipstick: NEGATIVE
KETONES UR: NEGATIVE mg/dL
Leukocytes, UA: NEGATIVE
Nitrite: NEGATIVE
PROTEIN: 100 mg/dL — AB
Specific Gravity, Urine: 1.01 (ref 1.005–1.030)
Squamous Epithelial / LPF: NONE SEEN
pH: 5 (ref 5.0–8.0)

## 2016-10-31 LAB — CBC
HCT: 23.9 % — ABNORMAL LOW (ref 39.0–52.0)
HCT: 24 % — ABNORMAL LOW (ref 39.0–52.0)
HEMOGLOBIN: 7.9 g/dL — AB (ref 13.0–17.0)
HEMOGLOBIN: 7.9 g/dL — AB (ref 13.0–17.0)
MCH: 29.7 pg (ref 26.0–34.0)
MCH: 29.8 pg (ref 26.0–34.0)
MCHC: 33.1 g/dL (ref 30.0–36.0)
MCHC: 32.9 g/dL (ref 30.0–36.0)
MCV: 89.8 fL (ref 78.0–100.0)
MCV: 90.6 fL (ref 78.0–100.0)
PLATELETS: 147 10*3/uL — AB (ref 150–400)
Platelets: 135 10*3/uL — ABNORMAL LOW (ref 150–400)
RBC: 2.66 MIL/uL — AB (ref 4.22–5.81)
RBC: 2.65 MIL/uL — AB (ref 4.22–5.81)
RDW: 13.4 % (ref 11.5–15.5)
RDW: 13.6 % (ref 11.5–15.5)
WBC: 9.5 10*3/uL (ref 4.0–10.5)
WBC: 9.9 10*3/uL (ref 4.0–10.5)

## 2016-10-31 LAB — COMPREHENSIVE METABOLIC PANEL
ALBUMIN: 3.2 g/dL — AB (ref 3.5–5.0)
ALT: 69 U/L — AB (ref 17–63)
ANION GAP: 18 — AB (ref 5–15)
AST: 42 U/L — AB (ref 15–41)
Alkaline Phosphatase: 105 U/L (ref 38–126)
BUN: 129 mg/dL — ABNORMAL HIGH (ref 6–20)
CALCIUM: 9.1 mg/dL (ref 8.9–10.3)
CO2: 19 mmol/L — ABNORMAL LOW (ref 22–32)
Chloride: 99 mmol/L — ABNORMAL LOW (ref 101–111)
Creatinine, Ser: 6.3 mg/dL — ABNORMAL HIGH (ref 0.61–1.24)
GFR calc non Af Amer: 9 mL/min — ABNORMAL LOW (ref >60)
GFR, EST AFRICAN AMERICAN: 10 mL/min — AB (ref >60)
Glucose, Bld: 150 mg/dL — ABNORMAL HIGH (ref 65–99)
POTASSIUM: 4.4 mmol/L (ref 3.5–5.1)
SODIUM: 136 mmol/L (ref 135–145)
TOTAL PROTEIN: 6.6 g/dL (ref 6.5–8.1)
Total Bilirubin: 1.1 mg/dL (ref 0.3–1.2)

## 2016-10-31 LAB — RAPID URINE DRUG SCREEN, HOSP PERFORMED
Amphetamines: NOT DETECTED
BENZODIAZEPINES: NOT DETECTED
Barbiturates: NOT DETECTED
Cocaine: NOT DETECTED
Opiates: POSITIVE — AB
Tetrahydrocannabinol: NOT DETECTED

## 2016-10-31 LAB — GLUCOSE, CAPILLARY
GLUCOSE-CAPILLARY: 144 mg/dL — AB (ref 65–99)
GLUCOSE-CAPILLARY: 159 mg/dL — AB (ref 65–99)
GLUCOSE-CAPILLARY: 176 mg/dL — AB (ref 65–99)
Glucose-Capillary: 207 mg/dL — ABNORMAL HIGH (ref 65–99)
Glucose-Capillary: 157 mg/dL — ABNORMAL HIGH (ref 65–99)

## 2016-10-31 LAB — TROPONIN I
TROPONIN I: 0.09 ng/mL — AB (ref <0.03)
TROPONIN I: 0.09 ng/mL — AB (ref <0.03)
TROPONIN I: 0.08 ng/mL — AB (ref <0.03)
Troponin I: 0.08 ng/mL (ref <0.03)

## 2016-10-31 LAB — CREATININE, SERUM
CREATININE: 6.19 mg/dL — AB (ref 0.61–1.24)
GFR calc Af Amer: 10 mL/min — ABNORMAL LOW (ref >60)
GFR, EST NON AFRICAN AMERICAN: 9 mL/min — AB (ref >60)

## 2016-10-31 LAB — BASIC METABOLIC PANEL
ANION GAP: 19 — AB (ref 5–15)
BUN: 128 mg/dL — ABNORMAL HIGH (ref 6–20)
CALCIUM: 8.8 mg/dL — AB (ref 8.9–10.3)
CO2: 16 mmol/L — AB (ref 22–32)
Chloride: 100 mmol/L — ABNORMAL LOW (ref 101–111)
Creatinine, Ser: 6 mg/dL — ABNORMAL HIGH (ref 0.61–1.24)
GFR, EST AFRICAN AMERICAN: 11 mL/min — AB (ref >60)
GFR, EST NON AFRICAN AMERICAN: 9 mL/min — AB (ref >60)
Glucose, Bld: 233 mg/dL — ABNORMAL HIGH (ref 65–99)
Potassium: 4.8 mmol/L (ref 3.5–5.1)
SODIUM: 135 mmol/L (ref 135–145)

## 2016-10-31 LAB — MAGNESIUM: Magnesium: 2.5 mg/dL — ABNORMAL HIGH (ref 1.7–2.4)

## 2016-10-31 LAB — PHOSPHORUS: Phosphorus: 5.6 mg/dL — ABNORMAL HIGH (ref 2.5–4.6)

## 2016-10-31 LAB — FERRITIN: FERRITIN: 491 ng/mL — AB (ref 24–336)

## 2016-10-31 LAB — TSH: TSH: 5.521 u[IU]/mL — AB (ref 0.350–4.500)

## 2016-10-31 LAB — BRAIN NATRIURETIC PEPTIDE: B NATRIURETIC PEPTIDE 5: 1900 pg/mL — AB (ref 0.0–100.0)

## 2016-10-31 LAB — MRSA PCR SCREENING: MRSA by PCR: NEGATIVE

## 2016-10-31 MED ORDER — INSULIN ASPART 100 UNIT/ML ~~LOC~~ SOLN
0.0000 [IU] | Freq: Three times a day (TID) | SUBCUTANEOUS | Status: DC
  Administered 2016-10-31: 2 [IU] via SUBCUTANEOUS
  Administered 2016-11-01: 3 [IU] via SUBCUTANEOUS
  Administered 2016-11-01: 2 [IU] via SUBCUTANEOUS
  Administered 2016-11-01 – 2016-11-02 (×2): 1 [IU] via SUBCUTANEOUS
  Administered 2016-11-02: 2 [IU] via SUBCUTANEOUS
  Administered 2016-11-03: 5 [IU] via SUBCUTANEOUS
  Administered 2016-11-03: 2 [IU] via SUBCUTANEOUS
  Administered 2016-11-04 (×2): 3 [IU] via SUBCUTANEOUS
  Administered 2016-11-05: 2 [IU] via SUBCUTANEOUS
  Administered 2016-11-05: 3 [IU] via SUBCUTANEOUS

## 2016-10-31 MED ORDER — SODIUM CHLORIDE 0.9% FLUSH
3.0000 mL | Freq: Two times a day (BID) | INTRAVENOUS | Status: DC
  Administered 2016-10-31: 3 mL via INTRAVENOUS

## 2016-10-31 MED ORDER — ALTEPLASE 2 MG IJ SOLR: 2.0000 mg | Freq: Once | INTRAMUSCULAR | Status: DC | PRN

## 2016-10-31 MED ORDER — ACETAMINOPHEN 650 MG RE SUPP: 650.0000 mg | Freq: Four times a day (QID) | RECTAL | Status: DC | PRN

## 2016-10-31 MED ORDER — SODIUM CHLORIDE 0.9 % IV SOLN: 100.0000 mL | INTRAVENOUS | Status: DC | PRN

## 2016-10-31 MED ORDER — ASPIRIN EC 81 MG PO TBEC
81.0000 mg | DELAYED_RELEASE_TABLET | Freq: Every day | ORAL | Status: DC
  Administered 2016-11-01 – 2016-11-02 (×2): 81 mg via ORAL
  Filled 2016-10-31 (×2): qty 1

## 2016-10-31 MED ORDER — ONDANSETRON HCL 4 MG/2ML IJ SOLN
4.0000 mg | Freq: Four times a day (QID) | INTRAMUSCULAR | Status: DC | PRN
  Administered 2016-11-02: 4 mg via INTRAVENOUS
  Filled 2016-10-31: qty 2

## 2016-10-31 MED ORDER — CARVEDILOL 6.25 MG PO TABS
6.2500 mg | ORAL_TABLET | Freq: Two times a day (BID) | ORAL | Status: DC
  Administered 2016-10-31 – 2016-11-01 (×3): 6.25 mg via ORAL
  Filled 2016-10-31 (×3): qty 1

## 2016-10-31 MED ORDER — INSULIN ASPART 100 UNIT/ML ~~LOC~~ SOLN
0.0000 [IU] | Freq: Every day | SUBCUTANEOUS | Status: DC
  Administered 2016-11-01 – 2016-11-02 (×2): 3 [IU] via SUBCUTANEOUS
  Administered 2016-11-03 – 2016-11-04 (×2): 2 [IU] via SUBCUTANEOUS

## 2016-10-31 MED ORDER — SODIUM CHLORIDE 0.9 % IV SOLN: 250.0000 mL | INTRAVENOUS | Status: DC | PRN

## 2016-10-31 MED ORDER — SODIUM CHLORIDE 0.9% FLUSH: 3.0000 mL | INTRAVENOUS | Status: DC | PRN

## 2016-10-31 MED ORDER — LIDOCAINE HCL (PF) 1 % IJ SOLN: 5.0000 mL | INTRAMUSCULAR | Status: DC | PRN

## 2016-10-31 MED ORDER — PENTAFLUOROPROP-TETRAFLUOROETH EX AERO
1.0000 "application " | INHALATION_SPRAY | CUTANEOUS | Status: DC | PRN
  Filled 2016-10-31: qty 30

## 2016-10-31 MED ORDER — CITALOPRAM HYDROBROMIDE 20 MG PO TABS
20.0000 mg | ORAL_TABLET | Freq: Every day | ORAL | Status: DC
  Administered 2016-10-31 – 2016-11-05 (×6): 20 mg via ORAL
  Filled 2016-10-31 (×6): qty 1

## 2016-10-31 MED ORDER — ONDANSETRON HCL 4 MG PO TABS: 4.0000 mg | ORAL_TABLET | Freq: Four times a day (QID) | ORAL | Status: DC | PRN

## 2016-10-31 MED ORDER — INSULIN ASPART 100 UNIT/ML ~~LOC~~ SOLN
0.0000 [IU] | SUBCUTANEOUS | Status: DC
  Administered 2016-10-31: 2 [IU] via SUBCUTANEOUS
  Administered 2016-10-31: 3 [IU] via SUBCUTANEOUS

## 2016-10-31 MED ORDER — ATORVASTATIN CALCIUM 80 MG PO TABS
80.0000 mg | ORAL_TABLET | Freq: Every day | ORAL | Status: DC
Start: 2016-10-31 — End: 2016-11-03
  Administered 2016-10-31 – 2016-11-02 (×3): 80 mg via ORAL
  Filled 2016-10-31 (×3): qty 1

## 2016-10-31 MED ORDER — HEPARIN SODIUM (PORCINE) 1000 UNIT/ML DIALYSIS
1000.0000 [IU] | INTRAMUSCULAR | Status: DC | PRN
  Filled 2016-10-31: qty 1

## 2016-10-31 MED ORDER — HEPARIN SODIUM (PORCINE) 5000 UNIT/ML IJ SOLN
5000.0000 [IU] | Freq: Three times a day (TID) | INTRAMUSCULAR | Status: DC
  Administered 2016-10-31 – 2016-11-01 (×4): 5000 [IU] via SUBCUTANEOUS
  Filled 2016-10-31 (×4): qty 1

## 2016-10-31 MED ORDER — ACETAMINOPHEN 325 MG PO TABS
650.0000 mg | ORAL_TABLET | Freq: Four times a day (QID) | ORAL | Status: DC | PRN
  Administered 2016-11-02: 650 mg via ORAL
  Filled 2016-10-31 (×3): qty 2

## 2016-10-31 MED ORDER — DARBEPOETIN ALFA 60 MCG/0.3ML IJ SOSY
60.0000 ug | PREFILLED_SYRINGE | INTRAMUSCULAR | Status: DC
  Administered 2016-11-01: 60 ug via INTRAVENOUS
  Filled 2016-10-31: qty 0.3

## 2016-10-31 MED ORDER — EZETIMIBE 10 MG PO TABS
10.0000 mg | ORAL_TABLET | Freq: Every day | ORAL | Status: DC
  Administered 2016-10-31 – 2016-11-05 (×6): 10 mg via ORAL
  Filled 2016-10-31 (×6): qty 1

## 2016-10-31 MED ORDER — ASPIRIN EC 325 MG PO TBEC
325.0000 mg | DELAYED_RELEASE_TABLET | Freq: Every day | ORAL | Status: DC
  Administered 2016-10-31: 325 mg via ORAL
  Filled 2016-10-31: qty 1

## 2016-10-31 MED ORDER — LIDOCAINE-PRILOCAINE 2.5-2.5 % EX CREA
1.0000 "application " | TOPICAL_CREAM | CUTANEOUS | Status: DC | PRN
  Filled 2016-10-31: qty 5

## 2016-10-31 NOTE — ED Notes (Addendum)
Insulin pump removed by pt per verbal order from Dr. Roel Cluck.

## 2016-10-31 NOTE — Consult Note (Signed)
[facility] Consult Note     Date: 10/31/2016                  Patient Name:  Kenneth [name]  MRN: [id number]  DOB: Sep 11, 1956  Age / Sex: 61 y.o., male         PCP: YU, Donnie Aho, MD                 Service Requesting Consult: [facility] Dr. Roel Cluck                 Reason for Consult: ESRD            Chief Complaint: weakness and SOB  HPI: Pt is a 35M with a PMH of NSTEMI s/p CABG 01/1024 complicated by AKI requiring HD progressing to ESRD, Afib, HTN, DM II.  He was actually on dialysis at [facility] from 06/2016--> 10/05/2016.  He recovered enough renal function to be able to come off dialysis (24 hr UOP 1300 mL, Scr 2.88).  Since that time, he has been doing relatively well until the last week or so.  Scr has increased steadily and pt has had decreased appetite.  He had an episode of SOB today that alarmed his family so he was brought into the ED .  He appears comfortable now.  He has a L brachiocephalic AVF which was placed 06/2016.  He saw Dr. Donnetta Hutching 07/2016 and fistula was deemed suitable for use on 09/23/2015.  It does not appear to have been used.    Past Medical History:  Diagnosis Date  . Acute pulmonary edema ([place]) 05/31/2016  . Acute respiratory failure with hypoxemia ([place]) 05/31/2016  . AKI (acute kidney injury) ([place])   . CHF (congestive heart failure) ([place])   . 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 HCAP (healthcare-associated pneumonia)   . Ischemic rest pain of lower extremity ([place]) 02/06/2015  . 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 NSTEMI (non-ST elevated myocardial infarction) ([place])   . Pain in lower limb 02/06/2015  . Pain in the chest   . Pleural effusion   . 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 Shortness of breath     Past Surgical History:  Procedure Laterality Date  . AV FISTULA PLACEMENT Left 06/22/2016   Procedure: ARTERIOVENOUS (AV) FISTULA CREATION LEFT UPPER ARM;  Surgeon: Rosetta Posner, MD;   Location: [place];  Service: Vascular;  Laterality: Left;  . CARDIAC CATHETERIZATION N/A 05/31/2016   Procedure: Left Heart Cath and Coronary Angiography;  Surgeon: Jettie Booze, MD;  Location: [place] CV LAB;  Service: Cardiovascular;  Laterality: N/A;  . CARDIAC CATHETERIZATION N/A 05/31/2016   Procedure: Right Heart Cath;  Surgeon: Jettie Booze, MD;  Location: [place] CV LAB;  Service: Cardiovascular;  Laterality: N/A;  . CARDIAC CATHETERIZATION N/A 05/31/2016   Procedure: IABP Insertion;  Surgeon: Jettie Booze, MD;  Location: [place] CV LAB;  Service: Cardiovascular;  Laterality: N/A;  . CORONARY ARTERY BYPASS GRAFT N/A 06/05/2016   Procedure: CORONARY ARTERY BYPASS GRAFTING (CABG) x3 LIMA to LAD -SVG to OM -SVG to RCA;  Surgeon: Ivin Poot, MD;  Location: [place];  Service: Open Heart Surgery;  Laterality: N/A;  . INSERTION OF DIALYSIS CATHETER N/A 06/05/2016   Procedure: INSERTION OF DIALYSIS/trialysis CATHETER;  Surgeon: Ivin Poot, MD;  Location: [place];  Service: Vascular;  Laterality: N/A;  . INSERTION OF DIALYSIS CATHETER Right 06/13/2016  Procedure: INSERTION OF DIALYSIS CATHETER RIGHT INTERNAL JUGULAR;  Surgeon: Conrad Hale Center, MD;  Location: [place];  Service: Vascular;  Laterality: Right;  . INTRAOPERATIVE TRANSESOPHAGEAL ECHOCARDIOGRAM N/A 06/05/2016   Procedure: INTRAOPERATIVE TRANSESOPHAGEAL ECHOCARDIOGRAM;  Surgeon: Ivin Poot, MD;  Location: [place];  Service: Open Heart Surgery;  Laterality: N/A;    Family History  Problem Relation Age of Onset  . CAD Father   . Colon cancer Father   . Diabetes Brother    Social History:  reports that he has quit smoking. He has never used smokeless tobacco. He reports that he does not drink alcohol or use drugs.  Allergies:  Allergies  Allergen Reactions  . Hydralazine Rash     (Not in a hospital admission)  Results for orders placed or performed during the hospital encounter of 10/30/16 (from the  past 48 hour(s))  Comprehensive metabolic panel     Status: Abnormal   Collection Time: 10/30/16  8:24 PM  Result Value Ref Range   Sodium 133 (L) 135 - 145 mmol/L   Potassium 5.5 (H) 3.5 - 5.1 mmol/L    Comment: SPECIMEN HEMOLYZED. HEMOLYSIS MAY AFFECT INTEGRITY OF RESULTS.   Chloride 99 (L) 101 - 111 mmol/L   CO2 19 (L) 22 - 32 mmol/L   Glucose, Bld 202 (H) 65 - 99 mg/dL   BUN 127 (H) 6 - 20 mg/dL   Creatinine, Ser 6.04 (H) 0.61 - 1.24 mg/dL   Calcium 9.0 8.9 - 10.3 mg/dL   Total Protein 7.0 6.5 - 8.1 g/dL   Albumin 3.4 (L) 3.5 - 5.0 g/dL   AST 78 (H) 15 - 41 U/L   ALT 87 (H) 17 - 63 U/L   Alkaline Phosphatase 104 38 - 126 U/L   Total Bilirubin 0.7 0.3 - 1.2 mg/dL   GFR calc non Af Amer 9 (L) >60 mL/min   GFR calc Af Amer 11 (L) >60 mL/min    Comment: (NOTE) The eGFR has been calculated using the CKD EPI equation. This calculation has not been validated in all clinical situations. eGFR's persistently <60 mL/min signify possible Chronic Kidney Disease.    Anion gap 15 5 - 15  CBC WITH DIFFERENTIAL     Status: Abnormal   Collection Time: 10/30/16  8:24 PM  Result Value Ref Range   WBC 9.3 4.0 - 10.5 K/uL   RBC 2.94 (L) 4.22 - 5.81 MIL/uL   Hemoglobin 8.7 (L) 13.0 - 17.0 g/dL   HCT 26.4 (L) 39.0 - 52.0 %   MCV 89.8 78.0 - 100.0 fL   MCH 29.6 26.0 - 34.0 pg   MCHC 33.0 30.0 - 36.0 g/dL   RDW 13.8 11.5 - 15.5 %   Platelets 163 150 - 400 K/uL   Neutrophils Relative % 65 %   Neutro Abs 6.2 1.7 - 7.7 K/uL   Lymphocytes Relative 9 %   Lymphs Abs 0.8 0.7 - 4.0 K/uL   Monocytes Relative 9 %   Monocytes Absolute 0.8 0.1 - 1.0 K/uL   Eosinophils Relative 16 %   Eosinophils Absolute 1.5 (H) 0.0 - 0.7 K/uL   Basophils Relative 1 %   Basophils Absolute 0.1 0.0 - 0.1 K/uL  CBG monitoring, ED     Status: Abnormal   Collection Time: 10/30/16  8:32 PM  Result Value Ref Range   Glucose-Capillary 203 (H) 65 - 99 mg/dL  I-Stat Chem 8, ED     Status: Abnormal   Collection Time:  10/30/16  8:48 PM  Result Value Ref Range   Sodium 134 (L) 135 - 145 mmol/L   Potassium 5.4 (H) 3.5 - 5.1 mmol/L   Chloride 99 (L) 101 - 111 mmol/L   BUN 121 (H) 6 - 20 mg/dL   Creatinine, Ser 7.10 (H) 0.61 - 1.24 mg/dL   Glucose, Bld 208 (H) 65 - 99 mg/dL   Calcium, Ion 1.09 (L) 1.15 - 1.40 mmol/L   TCO2 22 0 - 100 mmol/L   Hemoglobin 8.8 (L) 13.0 - 17.0 g/dL   HCT 26.0 (L) 39.0 - 52.0 %  I-Stat CG4 Lactic Acid, ED  (not at  [facility])     Status: None   Collection Time: 10/30/16  8:49 PM  Result Value Ref Range   Lactic Acid, Venous 1.01 0.5 - 1.9 mmol/L  Ammonia     Status: None   Collection Time: 10/30/16  8:59 PM  Result Value Ref Range   Ammonia 14 9 - 35 umol/L  Ethanol     Status: None   Collection Time: 10/30/16  8:59 PM  Result Value Ref Range   Alcohol, Ethyl (B) <5 <5 mg/dL    Comment:        LOWEST DETECTABLE LIMIT FOR SERUM ALCOHOL IS 5 mg/dL FOR MEDICAL PURPOSES ONLY   I-Stat arterial blood gas, ED ([facility], [facility])     Status: Abnormal   Collection Time: 10/30/16  9:04 PM  Result Value Ref Range   pH, Arterial 7.404 7.350 - 7.450   pCO2 arterial 33.4 32.0 - 48.0 mmHg   pO2, Arterial 36.0 (LL) 83.0 - 108.0 mmHg   Bicarbonate 20.8 20.0 - 28.0 mmol/L   TCO2 22 0 - 100 mmol/L   O2 Saturation 69.0 %   Acid-base deficit 3.0 (H) 0.0 - 2.0 mmol/L   Patient temperature 98.8 F    Collection site RADIAL, ALLEN'S TEST ACCEPTABLE    Drawn by Operator    Sample type ARTERIAL    Comment NOTIFIED PHYSICIAN   I-Stat arterial blood gas, ED     Status: Abnormal   Collection Time: 10/30/16  9:26 PM  Result Value Ref Range   pH, Arterial 7.416 7.350 - 7.450   pCO2 arterial 31.7 (L) 32.0 - 48.0 mmHg   pO2, Arterial 57.0 (L) 83.0 - 108.0 mmHg   Bicarbonate 20.4 20.0 - 28.0 mmol/L   TCO2 21 0 - 100 mmol/L   O2 Saturation 90.0 %   Acid-base deficit 4.0 (H) 0.0 - 2.0 mmol/L   Patient temperature 98.7 F    Collection site RADIAL, ALLEN'S TEST ACCEPTABLE    Drawn by RT    Sample type  ARTERIAL   Troponin I     Status: Abnormal   Collection Time: 10/30/16 11:21 PM  Result Value Ref Range   Troponin I 0.08 (HH) <0.03 ng/mL    Comment: CRITICAL RESULT CALLED TO, READ BACK BY AND VERIFIED WITH: BROWN K,RN 10/31/16 0027 WAYK   I-Stat CG4 Lactic Acid, ED  (not at  [facility])     Status: None   Collection Time: 10/30/16 11:22 PM  Result Value Ref Range   Lactic Acid, Venous 0.84 0.5 - 1.9 mmol/L  Urinalysis, Routine w reflex microscopic     Status: Abnormal   Collection Time: 10/30/16 11:39 PM  Result Value Ref Range   Color, Urine YELLOW YELLOW   APPearance CLEAR CLEAR   Specific Gravity, Urine 1.010 1.005 - 1.030   pH 5.0 5.0 - 8.0   Glucose, UA NEGATIVE  NEGATIVE mg/dL   Hgb urine dipstick NEGATIVE NEGATIVE   Bilirubin Urine NEGATIVE NEGATIVE   Ketones, ur NEGATIVE NEGATIVE mg/dL   Protein, ur 100 (A) NEGATIVE mg/dL   Nitrite NEGATIVE NEGATIVE   Leukocytes, UA NEGATIVE NEGATIVE   RBC / HPF 0-5 0 - 5 RBC/hpf   WBC, UA 0-5 0 - 5 WBC/hpf   Bacteria, UA RARE (A) NONE SEEN   Squamous Epithelial / LPF NONE SEEN NONE SEEN   Mucous PRESENT    Hyaline Casts, UA PRESENT   Urine rapid drug screen (hosp performed)not at [facility]     Status: Abnormal   Collection Time: 10/30/16 11:44 PM  Result Value Ref Range   Opiates POSITIVE (A) NONE DETECTED   Cocaine NONE DETECTED NONE DETECTED   Benzodiazepines NONE DETECTED NONE DETECTED   Amphetamines NONE DETECTED NONE DETECTED   Tetrahydrocannabinol NONE DETECTED NONE DETECTED   Barbiturates NONE DETECTED NONE DETECTED    Comment:        DRUG SCREEN FOR MEDICAL PURPOSES ONLY.  IF CONFIRMATION IS NEEDED FOR ANY PURPOSE, NOTIFY LAB WITHIN 5 DAYS.        LOWEST DETECTABLE LIMITS FOR URINE DRUG SCREEN Drug Class       Cutoff (ng/mL) Amphetamine      1000 Barbiturate      200 Benzodiazepine   659 Tricyclics       935 Opiates          300 Cocaine          300 THC              50    Ct Head Wo Contrast  Result Date:  10/30/2016 CLINICAL DATA:  61 y/o  M; altered mental status. EXAM: CT HEAD WITHOUT CONTRAST TECHNIQUE: Contiguous axial images were obtained from the base of the skull through the vertex without intravenous contrast. COMPARISON:  None. FINDINGS: Brain: No evidence of acute infarction, hemorrhage, hydrocephalus, extra-axial collection or mass lesion/mass effect. Vascular: Calcific atherosclerosis of cavernous and paraclinoid internal carotid arteries. Skull: Normal. Negative for fracture or focal lesion. Sinuses/Orbits: No acute finding. Other: None. IMPRESSION: 1. No acute intracranial abnormality identified. 2. Unremarkable CT of the head for age. Electronically Signed   By: Kristine Garbe M.D.   On: 10/30/2016 22:12   Dg Chest Port 1 View  Result Date: 10/30/2016 CLINICAL DATA:  61 y/o  M; altered mental status. EXAM: PORTABLE CHEST 1 VIEW COMPARISON:  07/14/2016 chest radiograph FINDINGS: Stable cardiac silhouette given projection and technique. Aortic atherosclerosis with calcification. Sternotomy wires are aligned. Transcutaneous pacers noted. Diffuse hazy opacification of the lungs probably represents alveolar pulmonary edema. No acute osseous abnormality. IMPRESSION: Moderate alveolar pulmonary edema. Stable cardiac silhouette. Aortic atherosclerosis. Electronically Signed   By: Kristine Garbe M.D.   On: 10/30/2016 21:24    ROS: all other systems reviewed and are negative except as per HPI  Blood pressure 162/66, pulse 60, temperature 98.2 F (36.8 C), temperature source Oral, resp. rate 11, SpO2 95 %. Physical Exam  GEN chronically ill-appearing, NAD HEENT eyes closed but opens them intermittently, sclerae anicteric, EOMI NECK prominent carotid pulse, + JVD PULM bibasilar crackles CV RRR no m/r/g ABD soft, nontender nondistended NABS EXT no LE edema NEURO nonfocal ACCESS: LUE AVF with good thrill and bruit  Assessment/Plan  1.  Weakness and SOB: uremic symptoms  likely contributing.  Further workup per primary 2.  ESRD: needs to restart dialysis.  LUE AVF with great thrill and bruit.  Will  attempt to stick 3.  HTN/ volume: previous EDW 59.5 kg.  Takes Coreg and hydralazine at home.   4.  Bone/ mineral: checking PTH and Vit D 5.  Anemia of chornic kidney disease: checking iron stores and ferritin 6.  Nutrition: albumin 3.4. 7.  H/o HFrEF: TTE 09/2016 with EF 60-65% 8.  CAD s/p CABG: on ASA, statin, BB   Madelon Lips, MD [employer] pgr [phone] 10/31/2016, 12:41 AM

## 2016-10-31 NOTE — Progress Notes (Signed)
  [facility] Progress Note   Assessment/ Plan:     1.  Weakness and SOB: uremic symptoms likely contributing.  Further workup per primary 2.  ESRD: needs to restart dialysis.  LUE AVF with great thrill and bruit.  Will attempt to stick with 17 needles today 3.  HTN/ volume: previous EDW 59.5 kg.  Takes Coreg and hydralazine at home.   4.  Bone/ mineral: checking PTH and Vit D 5.  Anemia of chornic kidney disease: checking iron stores and ferritin.  Hgb 7.9, will start Aranesp 60 mcg q Saturday 6.  Nutrition: albumin 3.4. 7.  H/o HFrEF: TTE 09/2016 with EF 60-65% 8.  CAD s/p CABG: on ASA, statin, BB  Subjective:    Re-initiation of HD today   Objective:   BP (!) 136/59 (BP Location: Right Arm)   Pulse 61   Temp 99.3 F (37.4 C) (Oral)   Resp 19   Ht 5\' 6"  (1.676 m)   Wt 62.1 kg (136 lb 14.5 oz)   SpO2 98%   BMI 22.10 kg/m   Physical Exam: GEN sleeping, easily arousable HEENT sclerae anicteric, EOMI NECK prominent carotid pulse, + JVD PULM bibasilar crackles CV RRR no m/r/g ABD soft, nontender nondistended NABS EXT no LE edema NEURO nonfocal ACCESS: LUE AVF with good thrill and bruit  Labs: BMET  Recent Labs Lab 10/30/16 0025 10/30/16 2024 10/30/16 2048 10/31/16 0332  NA 135 133* 134* 135  K 4.8 5.5* 5.4* 4.7  CL 100* 99* 99* 99*  CO2 16* 19*  --  22  GLUCOSE 233* 202* 208* 198*  BUN 128* 127* 121* 128*  CREATININE 6.00* 6.04* 7.10* 6.19*  6.08*  CALCIUM 8.8* 9.0  --  9.0  PHOS  --   --   --  5.7*   CBC  Recent Labs Lab 10/30/16 2024 10/30/16 2048 10/31/16 0332  WBC 9.3  --  9.5  NEUTROABS 6.2  --   --   HGB 8.7* 8.8* 7.9*  HCT 26.4* 26.0* 23.9*  MCV 89.8  --  89.8  PLT 163  --  147*    @IMGRELPRIORS @ Medications:    . aspirin  325 mg Oral Daily  . atorvastatin  80 mg Oral Daily  . carvedilol  6.25 mg Oral BID WC  . citalopram  20 mg Oral Daily  . ezetimibe  10 mg Oral Daily  . heparin  5,000 Units Subcutaneous Q8H  .  insulin aspart  0-9 Units Subcutaneous Q4H  . sodium chloride flush  3 mL Intravenous Q12H  . sodium chloride flush  3 mL Intravenous Q12H     Madelon Lips, MD [facility] pgr [phone] 10/31/2016, 8:20 AM

## 2016-10-31 NOTE — Progress Notes (Addendum)
[place] TEAM 1 - Stepdown/ICU TEAM  Kenneth [name]  GEZ:[id number] DOB: Jan 01, 1956 DOA: 10/30/2016 PCP: [name], MD    Brief Narrative:  61 y.o. male with history of DM2, CKD stage IV requiring HD for a period of time, CAD s/p CABG, and chronic systolic CHF who presented with worsening somnolence and confusion with progressive leg edema and abdom distention for a few weeks.  A plan was in place for him to resume dialysis as an outpatient on 11/02/2016, as increasing doses of Lasix had failed to improve his symptoms.   Subjective: Came to see pt.  He was away in HD.  Chart reviewed in detail.  All active medical issues being addressed by Nephrology at this time.  Will f/u in AM.    Assessment & Plan:  ESRD - Weakness and SOB restart dialysis per Nephrology - BUN > 120   HTN previous EDW 59.5 kg - BP controlled at this time   Anemia of chronic kidney disease EPO +/- Fe per Nephrology   CAD s/p CABG on ASA, statin, BB  Elevated TSH Check FT4 in AM   DM2  DVT prophylaxis: SQ heparin  Code Status: FULL CODE Family Communication:  Disposition Plan:   Consultants:  Nephrology   Procedures: none  Antimicrobials:  Anti-infectives    None     Objective: Blood pressure 122/64, pulse 60, temperature 98.3 F (36.8 C), temperature source Oral, resp. rate 15, height 5\' 6"  (1.676 m), weight 62.1 kg (136 lb 14.5 oz), SpO2 96 %.  Intake/Output Summary (Last 24 hours) at 10/31/16 1604 Last data filed at 10/30/16 2348  Gross per 24 hour  Intake                0 ml  Output              550 ml  Net             -550 ml   Filed Weights   10/31/16 0301  Weight: 62.1 kg (136 lb 14.5 oz)    Examination:   CBC:  Recent Labs Lab 10/30/16 2024 10/30/16 2048 10/31/16 0332 10/31/16 0846  WBC 9.3  --  9.5 9.9  NEUTROABS 6.2  --   --   --   HGB 8.7* 8.8* 7.9* 7.9*  HCT 26.4* 26.0* 23.9* 24.0*  MCV 89.8  --  89.8 90.6  PLT 163  --  147* 650*   Basic Metabolic  Panel:  Recent Labs Lab 10/30/16 0025 10/30/16 2024 10/30/16 2048 10/31/16 0332 10/31/16 0846  NA 135 133* 134* 135 136  K 4.8 5.5* 5.4* 4.7 4.4  CL 100* 99* 99* 99* 99*  CO2 16* 19*  --  22 19*  GLUCOSE 233* 202* 208* 198* 150*  BUN 128* 127* 121* 128* 129*  CREATININE 6.00* 6.04* 7.10* 6.19*  6.08* 6.30*  CALCIUM 8.8* 9.0  --  9.0 9.1  MG  --   --   --   --  2.5*  PHOS  --   --   --  5.7* 5.6*   GFR: Estimated Creatinine Clearance: 11.1 mL/min (by C-G formula based on SCr of 6.3 mg/dL (H)).  Liver Function Tests:  Recent Labs Lab 10/30/16 2024 10/31/16 0332 10/31/16 0846  AST 78*  --  42*  ALT 87*  --  69*  ALKPHOS 104  --  105  BILITOT 0.7  --  1.1  PROT 7.0  --  6.6  ALBUMIN 3.4*  3.1* 3.2*    Recent Labs Lab 10/30/16 2059  AMMONIA 14   Cardiac Enzymes:  Recent Labs Lab 10/30/16 2321 10/31/16 0332 10/31/16 0846  TROPONINI 0.08* 0.09* 0.09*    HbA1C: Hgb A1c MFr Bld  Date/Time Value Ref Range Status  06/04/2016 04:30 PM 10.4 (H) 4.8 - 5.6 % Final    Comment:    (NOTE)         Pre-diabetes: 5.7 - 6.4         Diabetes: >6.4         Glycemic control for adults with diabetes: <7.0     CBG:  Recent Labs Lab 10/30/16 2032 10/31/16 0300 10/31/16 0753 10/31/16 1132  GLUCAP 203* 207* 144* 157*    Recent Results (from the past 240 hour(s))  Blood Culture (routine x 2)     Status: None (Preliminary result)   Collection Time: 10/30/16  8:44 PM  Result Value Ref Range Status   Specimen Description BLOOD RIGHT ANTECUBITAL  Final   Special Requests BOTTLES DRAWN AEROBIC AND ANAEROBIC 5CC  Final   Culture NO GROWTH < 24 HOURS  Final   Report Status PENDING  Incomplete  Blood Culture (routine x 2)     Status: None (Preliminary result)   Collection Time: 10/30/16  9:03 PM  Result Value Ref Range Status   Specimen Description BLOOD RIGHT HAND  Final   Special Requests BOTTLES DRAWN AEROBIC AND ANAEROBIC 5CC  Final   Culture NO GROWTH < 24 HOURS   Final   Report Status PENDING  Incomplete  MRSA PCR Screening     Status: None   Collection Time: 10/31/16  3:26 AM  Result Value Ref Range Status   MRSA by PCR NEGATIVE NEGATIVE Final    Comment:        The GeneXpert MRSA Assay (FDA approved for NASAL specimens only), is one component of a comprehensive MRSA colonization surveillance program. It is not intended to diagnose MRSA infection nor to guide or monitor treatment for MRSA infections.      Scheduled Meds: . aspirin  325 mg Oral Daily  . atorvastatin  80 mg Oral Daily  . carvedilol  6.25 mg Oral BID WC  . citalopram  20 mg Oral Daily  . darbepoetin (ARANESP) injection - DIALYSIS  60 mcg Intravenous Q Sat-HD  . ezetimibe  10 mg Oral Daily  . heparin  5,000 Units Subcutaneous Q8H  . insulin aspart  0-9 Units Subcutaneous Q4H  . sodium chloride flush  3 mL Intravenous Q12H  . sodium chloride flush  3 mL Intravenous Q12H     LOS: 0 days   Cherene Altes, MD [facility] Office  [phone] Pager - Text Page per Shea Evans as per below:  On-Call/Text Page:      [email or web address]      password [email or web address]  If 7PM-7AM, please contact night-coverage [email or web address] Password [facility] 10/31/2016, 4:04 PM

## 2016-10-31 NOTE — Progress Notes (Signed)
Initial Nutrition Assessment  DOCUMENTATION CODES:   Not applicable  INTERVENTION:    Once diet advanced, start Nepro Shake po BID, each supplement provides 425 kcal and 19 grams protein  Recommend adding renal MVI  NUTRITION DIAGNOSIS:   Increased nutrient needs related to chronic illness (ESRD) as evidenced by estimated needs.  GOAL:   Patient will meet greater than or equal to 90% of their needs  MONITOR:   Diet advancement, PO intake, Supplement acceptance, Labs, Skin, I & O's  REASON FOR ASSESSMENT:   Malnutrition Screening Tool    ASSESSMENT:   61 y.o. male with PMH of type II diabetes, CKD stage IV requiring HD for a period of time, CAD, and chronic systolic CHF. He presented to the hospital on 2/16 with worsening somnolence and confusion. He has had progressive leg edema for past few weeks, has been seen by nephrology with plans to restart his hemodialysis on Monday.   Patient's wife reports that patient has been eating poorly, he drinks Nepro Shakes 4 ounces at home. Currently NPO due to lethargy yesterday per RN, hopeful for diet advancement today. From review of usual weights, patient has not lost any weight recently. Weight fluctuations are related to fluid status with ESRD and HD.  Nutrition-Focused physical exam completed. Findings are no fat depletion, mild muscle depletion, and no edema.  Labs reviewed: phosphorus 5.6, magnesium 2.5 Medications reviewed.   Diet Order:  Diet NPO time specified  Skin:  Wound (see comment) (stage I to right heel)  Last BM:  2/15  Height:   Ht Readings from Last 1 Encounters:  10/31/16 5\' 6"  (1.676 m)    Weight:   Wt Readings from Last 1 Encounters:  10/31/16 136 lb 14.5 oz (62.1 kg)    Ideal Body Weight:  64.5 kg  BMI:  Body mass index is 22.1 kg/m.  Estimated Nutritional Needs:   Kcal:  1900-2200  Protein:  80-95 gm  Fluid:  1.2 L  EDUCATION NEEDS:   No education needs identified at this  time  Molli Barrows, [place], [place], [place] Pager [phone] After Hours Pager [phone]

## 2016-10-31 NOTE — ED Notes (Signed)
Per Dr. Leonette Monarch pt able to have water in small amounts. Explained to pt and family to take small sips of water through straw. Pt verbalized understanding, this nurse gave pt a few small sips of water. Pt swallowing without difficulty.

## 2016-10-31 NOTE — Progress Notes (Signed)
Pt " very weak " has went into afib 80-105 paged Dr Maudie Mercury will continue to monitor

## 2016-11-01 LAB — HEPATITIS B SURFACE ANTIGEN: HEP B S AG: NEGATIVE

## 2016-11-01 LAB — HEPATITIS B CORE ANTIBODY, TOTAL: Hep B Core Total Ab: POSITIVE — AB

## 2016-11-01 LAB — CBC
HEMATOCRIT: 21.4 % — AB (ref 39.0–52.0)
HEMOGLOBIN: 7.2 g/dL — AB (ref 13.0–17.0)
MCH: 30 pg (ref 26.0–34.0)
MCHC: 33.6 g/dL (ref 30.0–36.0)
MCV: 89.2 fL (ref 78.0–100.0)
Platelets: 138 10*3/uL — ABNORMAL LOW (ref 150–400)
RBC: 2.4 MIL/uL — AB (ref 4.22–5.81)
RDW: 13.4 % (ref 11.5–15.5)
WBC: 3.4 10*3/uL — ABNORMAL LOW (ref 4.0–10.5)

## 2016-11-01 LAB — BASIC METABOLIC PANEL
Anion gap: 14 (ref 5–15)
BUN: 96 mg/dL — ABNORMAL HIGH (ref 6–20)
CHLORIDE: 94 mmol/L — AB (ref 101–111)
CO2: 22 mmol/L (ref 22–32)
Calcium: 8.4 mg/dL — ABNORMAL LOW (ref 8.9–10.3)
Creatinine, Ser: 5.53 mg/dL — ABNORMAL HIGH (ref 0.61–1.24)
GFR calc non Af Amer: 10 mL/min — ABNORMAL LOW (ref >60)
GFR, EST AFRICAN AMERICAN: 12 mL/min — AB (ref >60)
Glucose, Bld: 229 mg/dL — ABNORMAL HIGH (ref 65–99)
POTASSIUM: 4.7 mmol/L (ref 3.5–5.1)
SODIUM: 130 mmol/L — AB (ref 135–145)

## 2016-11-01 LAB — HIV ANTIBODY (ROUTINE TESTING W REFLEX): HIV SCREEN 4TH GENERATION: NONREACTIVE

## 2016-11-01 LAB — HEMOGLOBIN A1C
HEMOGLOBIN A1C: 7.7 % — AB (ref 4.8–5.6)
MEAN PLASMA GLUCOSE: 174 mg/dL

## 2016-11-01 LAB — GLUCOSE, CAPILLARY
GLUCOSE-CAPILLARY: 251 mg/dL — AB (ref 65–99)
Glucose-Capillary: 146 mg/dL — ABNORMAL HIGH (ref 65–99)
Glucose-Capillary: 176 mg/dL — ABNORMAL HIGH (ref 65–99)
Glucose-Capillary: 213 mg/dL — ABNORMAL HIGH (ref 65–99)

## 2016-11-01 LAB — HEPATITIS B SURFACE ANTIBODY,QUALITATIVE: Hep B S Ab: NONREACTIVE

## 2016-11-01 LAB — PARATHYROID HORMONE, INTACT (NO CA): PTH: 46 pg/mL (ref 15–65)

## 2016-11-01 LAB — MAGNESIUM: MAGNESIUM: 2.4 mg/dL (ref 1.7–2.4)

## 2016-11-01 LAB — VITAMIN D 25 HYDROXY (VIT D DEFICIENCY, FRACTURES): Vit D, 25-Hydroxy: 41.6 ng/mL (ref 30.0–100.0)

## 2016-11-01 LAB — T4, FREE: Free T4: 1.01 ng/dL (ref 0.61–1.12)

## 2016-11-01 MED ORDER — SODIUM CHLORIDE 0.9 % IV SOLN
125.0000 mg | INTRAVENOUS | Status: DC
  Administered 2016-11-02 – 2016-11-04 (×2): 125 mg via INTRAVENOUS
  Filled 2016-11-01 (×4): qty 10

## 2016-11-01 MED ORDER — CARVEDILOL 3.125 MG PO TABS: 3.1250 mg | ORAL_TABLET | Freq: Two times a day (BID) | ORAL | Status: DC

## 2016-11-01 MED ORDER — DARBEPOETIN ALFA 60 MCG/0.3ML IJ SOSY
PREFILLED_SYRINGE | INTRAMUSCULAR | Status: AC
  Administered 2016-11-01: 60 ug via INTRAVENOUS
  Filled 2016-11-01: qty 0.3

## 2016-11-01 MED ORDER — HEPARIN BOLUS VIA INFUSION
2000.0000 [IU] | Freq: Once | INTRAVENOUS | Status: AC
  Administered 2016-11-01: 2000 [IU] via INTRAVENOUS
  Filled 2016-11-01: qty 2000

## 2016-11-01 MED ORDER — HEPARIN (PORCINE) IN NACL 100-0.45 UNIT/ML-% IJ SOLN
850.0000 [IU]/h | INTRAMUSCULAR | Status: DC
  Administered 2016-11-01 – 2016-11-02 (×2): 850 [IU]/h via INTRAVENOUS
  Filled 2016-11-01 (×2): qty 250

## 2016-11-01 MED ORDER — SODIUM CHLORIDE 0.9 % IV SOLN: Freq: Once | INTRAVENOUS | Status: DC

## 2016-11-01 NOTE — Progress Notes (Signed)
ANTICOAGULATION CONSULT NOTE - Initial Consult  Pharmacy Consult for Heparin Indication: atrial fibrillation  Allergies  Allergen Reactions  . Hydralazine Rash    Patient Measurements: Height: 5\' 6"  (167.6 cm) Weight: 136 lb 11 oz (62 kg) IBW/kg (Calculated) : 63.8  Vital Signs: Temp: 98.6 F (37 C) (02/18 1622) Temp Source: Oral (02/18 1622) BP: 116/62 (02/18 1622) Pulse Rate: 99 (02/18 1622)  Labs:  Recent Labs  10/31/16 0332 10/31/16 0846 10/31/16 1436 11/01/16 0226 11/01/16 1715  HGB 7.9* 7.9*  --  7.2*  --   HCT 23.9* 24.0*  --  21.4*  --   PLT 147* 135*  --  138*  --   CREATININE 6.19*  6.08* 6.30*  --   --  5.53*  TROPONINI 0.09* 0.09* 0.08*  --   --     Estimated Creatinine Clearance: 12.6 mL/min (by C-G formula based on SCr of 5.53 mg/dL (H)).   Medical History: Past Medical History:  Diagnosis Date  . Acute pulmonary edema ([place]) 05/31/2016  . Acute respiratory failure with hypoxemia ([place]) 05/31/2016  . AKI (acute kidney injury) ([place])   . CHF (congestive heart failure) ([place])   . 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 HCAP (healthcare-associated pneumonia)   . Ischemic rest pain of lower extremity ([place]) 02/06/2015  . 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 NSTEMI (non-ST elevated myocardial infarction) ([place])   . Pain in lower limb 02/06/2015  . Pain in the chest   . Pleural effusion   . 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 Shortness of breath     Assessment: 61 year old male with ESRD to begin heparin for p. Afib Received sq heparin at 1400 pm  Goal of Therapy:  Heparin level 0.3-0.7 units/ml Monitor platelets by anticoagulation protocol: Yes   Plan:  Heparin 2000 units iv bolus x 1  Heparin drip at 850 units / hr Daily labs  Thank you Anette Guarneri, PharmD [phone]  11/01/2016,6:33 PM

## 2016-11-01 NOTE — Progress Notes (Signed)
[facility] TEAM 1 - Stepdown/ICU TEAM  Kenneth [name]  SEG:[id number] DOB: December 12, 1955 DOA: 10/30/2016 PCP: [name], MD    Brief Narrative:  61 y.o. male with history of DM2, CKD stage IV requiring HD for a period of time, CAD s/p CABG, and chronic systolic CHF who presented with worsening somnolence and confusion with progressive leg edema and abdom distention for a few weeks.  A plan was in place for him to resume dialysis as an outpatient on 11/02/2016, as increasing doses of Lasix had failed to improve his symptoms.   Subjective: The patient states he feels poor in general and complains of nausea but no vomiting.  He denies chest pain or shortness of breath.  He speaks to me via a family member at bedside.  Assessment & Plan:  ESRD - Weakness and SOB restart dialysis per Nephrology - BUN > 120   Parox Afib  Occurred transiently at time of his CABG in Sept 2017 but was thought to only be transient - recurred last night - followed by [facility] as outpt - will consult them in AM - rate presently controlled - CHA2DS2-VASc is 3 - initiate heparin if afib confirmed on EKG   Pauses on tele  RN reporting 2 episodes of pauses on tele (3 seconds and 4 seconds) - pt asypmotatic - perhaps he is attempting to flip in and out of afib? - BP stable - check lytes stat - EKG - follow on tele   HTN previous EDW 59.5 kg - BP controlled at this time  [facility] Weights   10/31/16 1543 11/01/16 0050 11/01/16 0256  Weight: 61.4 kg (135 lb 5.8 oz) 61.6 kg (135 lb 12.9 oz) 62 kg (136 lb 11 oz)    Anemia of chronic kidney disease EPO +/- Fe per Nephrology - will plan to transfuse w/ next HD if Hgb remains < 8.0 given known CAD  CAD s/p CABG x2 Sept 2017 on ASA, statin, BB  Elevated TSH FT4 normal - recheck TSH in 8-12 weeks when patient not acutely ill/hospitalized   DM2 CBG reasonably controlled - follow without change today  DVT prophylaxis: SQ heparin  Code Status: FULL CODE Family  Communication: spoke w/ wife at bedside  Disposition Plan: SDU  Consultants:  Nephrology   Procedures: none  Antimicrobials:  Anti-infectives    None     Objective: Blood pressure 99/63, pulse (!) 112, temperature 98.7 F (37.1 C), temperature source Oral, resp. rate 16, height 5\' 6"  (1.676 m), weight 62 kg (136 lb 11 oz), SpO2 97 %.  Intake/Output Summary (Last 24 hours) at 11/01/16 1539 Last data filed at 11/01/16 1500  Gross per 24 hour  Intake              720 ml  Output             -414 ml  Net             1134 ml   Filed Weights   10/31/16 1543 11/01/16 0050 11/01/16 0256  Weight: 61.4 kg (135 lb 5.8 oz) 61.6 kg (135 lb 12.9 oz) 62 kg (136 lb 11 oz)    Examination: General: No acute respiratory distress Lungs: Clear to auscultation bilaterally without wheezes or crackles Cardiovascular: Regular rate without murmur gallop or rub normal S1 and S2 Abdomen: Nontender, nondistended, soft, bowel sounds positive, no rebound, no ascites, no appreciable mass Extremities: No significant cyanosis, clubbing, or edema bilateral lower extremities   CBC:  Recent Labs Lab 10/30/16 2024 10/30/16 2048 10/31/16 0332 10/31/16 0846 11/01/16 0226  WBC 9.3  --  9.5 9.9 3.4*  NEUTROABS 6.2  --   --   --   --   HGB 8.7* 8.8* 7.9* 7.9* 7.2*  HCT 26.4* 26.0* 23.9* 24.0* 21.4*  MCV 89.8  --  89.8 90.6 89.2  PLT 163  --  147* 135* 350*   Basic Metabolic Panel:  Recent Labs Lab 10/30/16 0025 10/30/16 2024 10/30/16 2048 10/31/16 0332 10/31/16 0846  NA 135 133* 134* 135 136  K 4.8 5.5* 5.4* 4.7 4.4  CL 100* 99* 99* 99* 99*  CO2 16* 19*  --  22 19*  GLUCOSE 233* 202* 208* 198* 150*  BUN 128* 127* 121* 128* 129*  CREATININE 6.00* 6.04* 7.10* 6.19*  6.08* 6.30*  CALCIUM 8.8* 9.0  --  9.0 9.1  MG  --   --   --   --  2.5*  PHOS  --   --   --  5.7* 5.6*   GFR: Estimated Creatinine Clearance: 11.1 mL/min (by C-G formula based on SCr of 6.3 mg/dL (H)).  Liver Function  Tests:  Recent Labs Lab 10/30/16 2024 10/31/16 0332 10/31/16 0846  AST 78*  --  42*  ALT 87*  --  69*  ALKPHOS 104  --  105  BILITOT 0.7  --  1.1  PROT 7.0  --  6.6  ALBUMIN 3.4* 3.1* 3.2*    Recent Labs Lab 10/30/16 2059  AMMONIA 14   Cardiac Enzymes:  Recent Labs Lab 10/30/16 2321 10/31/16 0332 10/31/16 0846 10/31/16 1436  TROPONINI 0.08* 0.09* 0.09* 0.08*    HbA1C: Hgb A1c MFr Bld  Date/Time Value Ref Range Status  10/31/2016 03:32 AM 7.7 (H) 4.8 - 5.6 % Final    Comment:    (NOTE)         Pre-diabetes: 5.7 - 6.4         Diabetes: >6.4         Glycemic control for adults with diabetes: <7.0   06/04/2016 04:30 PM 10.4 (H) 4.8 - 5.6 % Final    Comment:    (NOTE)         Pre-diabetes: 5.7 - 6.4         Diabetes: >6.4         Glycemic control for adults with diabetes: <7.0     CBG:  Recent Labs Lab 10/31/16 1132 10/31/16 1755 10/31/16 2143 11/01/16 0735 11/01/16 1200  GLUCAP 157* 159* 176* 146* 176*    Recent Results (from the past 240 hour(s))  Blood Culture (routine x 2)     Status: None (Preliminary result)   Collection Time: 10/30/16  8:44 PM  Result Value Ref Range Status   Specimen Description BLOOD RIGHT ANTECUBITAL  Final   Special Requests BOTTLES DRAWN AEROBIC AND ANAEROBIC 5CC  Final   Culture NO GROWTH 2 DAYS  Final   Report Status PENDING  Incomplete  Blood Culture (routine x 2)     Status: None (Preliminary result)   Collection Time: 10/30/16  9:03 PM  Result Value Ref Range Status   Specimen Description BLOOD RIGHT HAND  Final   Special Requests BOTTLES DRAWN AEROBIC AND ANAEROBIC 5CC  Final   Culture NO GROWTH 2 DAYS  Final   Report Status PENDING  Incomplete  MRSA PCR Screening     Status: None   Collection Time: 10/31/16  3:26 AM  Result Value Ref Range  Status   MRSA by PCR NEGATIVE NEGATIVE Final    Comment:        The GeneXpert MRSA Assay (FDA approved for NASAL specimens only), is one component of a comprehensive  MRSA colonization surveillance program. It is not intended to diagnose MRSA infection nor to guide or monitor treatment for MRSA infections.      Scheduled Meds: . aspirin EC  81 mg Oral Daily  . atorvastatin  80 mg Oral Daily  . carvedilol  6.25 mg Oral BID WC  . citalopram  20 mg Oral Daily  . darbepoetin (ARANESP) injection - DIALYSIS  60 mcg Intravenous Q Sat-HD  . ezetimibe  10 mg Oral Daily  . [START ON 11/02/2016] ferric gluconate (FERRLECIT/NULECIT) IV  125 mg Intravenous Q M,W,F-HD  . heparin  5,000 Units Subcutaneous Q8H  . insulin aspart  0-5 Units Subcutaneous QHS  . insulin aspart  0-9 Units Subcutaneous TID WC     LOS: 1 day   Cherene Altes, MD [facility] Office  [phone] Pager - Text Page per Amion as per below:  On-Call/Text Page:      [email or web address]      password [email or web address]  If 7PM-7AM, please contact night-coverage [email or web address] Password [facility] 11/01/2016, 3:39 PM

## 2016-11-01 NOTE — Evaluation (Signed)
Physical Therapy Evaluation Patient Details Name: Kenneth [name] MRN: [id number] DOB: 1956-05-26 Today's Date: 11/01/2016   History of Present Illness  Kenneth [name] [facility] a 60 y.o.malewith medical history significant of type II diabetes, CKD stage IV requiring HD for a period of time, CAD, and chronic systolic CHF NSEMI September 2017. Pt admitted for worsening somnolence and confusion with progressive leg weakness.  Clinical Impression  Pt admitted with above diagnosis. Pt currently with functional limitations due to the deficits listed below (see PT Problem List). Spoke with MD how pt was pale with low BP and demo'd very unsteady gait. Pt planned for HD tomorrow 2/19.  Pt will benefit from skilled PT to increase their independence and safety with mobility to allow discharge to the venue listed below.       Follow Up Recommendations Home health PT;Supervision/Assistance - 24 hour    Equipment Recommendations  Rolling walker with 5" wheels    Recommendations for Other Services       Precautions / Restrictions Precautions Precautions: Fall Precaution Comments: low BP Restrictions Weight Bearing Restrictions: No      Mobility  Bed Mobility Overal bed mobility: Modified Independent             General bed mobility comments: pt impulsive sprang to EOB using abdominal muscles into long sit position  Transfers Overall transfer level: Needs assistance Equipment used: 1 person hand held assist Transfers: Sit to/from Stand Sit to Stand: Min assist         General transfer comment: minA for safety due to impulsivity due to dizziness  Ambulation/Gait Ambulation/Gait assistance: Min assist;Mod assist Ambulation Distance (Feet): 75 Feet Assistive device: 1 person hand held assist Gait Pattern/deviations: Step-through pattern;Decreased stride length;Leaning posteriorly;Staggering right;Narrow base of support Gait velocity: slow Gait velocity interpretation: Below normal speed  for age/gender General Gait Details: pt pale, BP 88/54, pt low response/lethargic. pt very unsteady requiring modA at times to maintain balance. had to sit in chair in hallway BP 66/52.  Stairs            Wheelchair Mobility    Modified Rankin (Stroke Patients Only)       Balance Overall balance assessment: Needs assistance Sitting-balance support: Bilateral upper extremity supported;Feet supported Sitting balance-Leahy Scale: Fair     Standing balance support: No upper extremity supported;During functional activity Standing balance-Leahy Scale: Poor Standing balance comment: requires physical assist                             Pertinent Vitals/Pain Pain Assessment: No/denies pain    Home Living Family/patient expects to be discharged to:: Private residence Living Arrangements: Spouse/significant other Available Help at Discharge: Family;Available 24 hours/day Type of Home: Homeless Home Access: Stairs to enter Entrance Stairs-Rails: Right Entrance Stairs-Number of Steps: 3 Home Layout: Two level;Bed/bath upstairs Home Equipment: None Additional Comments: english no primary language but was able to answer questions.     Prior Function Level of Independence: Independent               Hand Dominance   Dominant Hand: Right    Extremity/Trunk Assessment   Upper Extremity Assessment Upper Extremity Assessment: Generalized weakness    Lower Extremity Assessment Lower Extremity Assessment: Generalized weakness    Cervical / Trunk Assessment Cervical / Trunk Assessment: Normal  Communication   Communication: Prefers language other than English  Cognition Arousal/Alertness: Lethargic Behavior During Therapy: Flat affect;Impulsive Overall Cognitive Status: History of  cognitive impairments - at baseline Area of Impairment: Problem solving;Safety/judgement         Safety/Judgement: Decreased awareness of deficits   Problem Solving: Slow  processing;Decreased initiation;Difficulty sequencing;Requires verbal cues;Requires tactile cues General Comments: pt slow to respond unsure if it due to impaired cognition or language barrier    General Comments      Exercises     Assessment/Plan    PT Assessment Patient needs continued PT services  PT Problem List Decreased strength;Decreased range of motion;Decreased balance;Decreased activity tolerance;Decreased mobility;Decreased coordination;Decreased knowledge of use of DME;Decreased safety awareness          PT Treatment Interventions DME instruction;Gait training;Stair training;Functional mobility training;Therapeutic activities;Therapeutic exercise;Balance training    PT Goals (Current goals can be found in the Care Plan section)  Acute Rehab PT Goals Patient Stated Goal: didn't state PT Goal Formulation: With patient Time For Goal Achievement: 11/08/16 Potential to Achieve Goals: Good    Frequency Min 3X/week   Barriers to discharge        Co-evaluation               End of Session Equipment Utilized During Treatment: Gait belt Activity Tolerance: Patient limited by lethargy Patient left: in bed;with call bell/phone within reach;with bed alarm set;with family/visitor present Nurse Communication: Mobility status (low BP)         Time: 7703-4035 PT Time Calculation (min) (ACUTE ONLY): 15 min   Charges:   PT Evaluation $PT Eval Moderate Complexity: 1 Procedure     PT G Codes:        Kenneth [name] M Kenneth [name] 11/01/2016, 2:30 PM   Kittie Plater, PT, DPT Pager #: [phone] Office #: [phone]

## 2016-11-01 NOTE — Progress Notes (Signed)
  [facility] Progress Note   Assessment/ Plan:     1.  Weakness and SOB: uremic symptoms likely contributing.  Further workup per primary.  Has a low-grade temp of 99.5, could consider infectious etiology but clinically has no real localizing symptoms. 2.  ESRD: HD #1 2/17.  Next HD 2/19. 3.  HTN/ volume: previous EDW 59.5 kg.  Takes Coreg and hydralazine at home.   4.  Bone/ mineral: checking PTH and Vit D; pending 5.  Anemia of chornic kidney disease: Hgb 7.9 --> 7.2, Aranesp 60 mcg q Saturday (given 2/17), starting IV iron with next HD 6.  Nutrition: albumin 3.4. 7.  H/o HFrEF: TTE 09/2016 with EF 60-65% 8.  CAD s/p CABG: on ASA, statin, BB 9.  Afib- initially only noted post-op CABG, now has recurrence.  May have to consider more aggressive anticoagulation, defer this decision to cardiology.   Subjective:   Had first HD late last night- had an infiltration earlier in the day and then was brought back.  Was successfully cannulated later.  Flipped into Afib overnight.  No CP.   Objective:   BP (!) 100/58   Pulse 94   Temp 98.7 F (37.1 C) (Oral)   Resp (!) 22   Ht 5\' 6"  (1.676 m)   Wt 62 kg (136 lb 11 oz)   SpO2 97%   BMI 22.06 kg/m   Physical Exam: GEN sleeping, easily arousable HEENT sclerae anicteric, EOMI NECK prominent carotid pulse, + JVD PULM bibasilar crackles CV RRR no m/r/g ABD soft, nontender nondistended NABS EXT no LE edema NEURO nonfocal ACCESS: LUE AVF with good thrill and bruit  Labs: BMET  Recent Labs Lab 10/30/16 0025 10/30/16 2024 10/30/16 2048 10/31/16 0332 10/31/16 0846  NA 135 133* 134* 135 136  K 4.8 5.5* 5.4* 4.7 4.4  CL 100* 99* 99* 99* 99*  CO2 16* 19*  --  22 19*  GLUCOSE 233* 202* 208* 198* 150*  BUN 128* 127* 121* 128* 129*  CREATININE 6.00* 6.04* 7.10* 6.19*  6.08* 6.30*  CALCIUM 8.8* 9.0  --  9.0 9.1  PHOS  --   --   --  5.7* 5.6*   CBC  Recent Labs Lab 10/30/16 2024 10/30/16 2048 10/31/16 0332  10/31/16 0846 11/01/16 0226  WBC 9.3  --  9.5 9.9 3.4*  NEUTROABS 6.2  --   --   --   --   HGB 8.7* 8.8* 7.9* 7.9* 7.2*  HCT 26.4* 26.0* 23.9* 24.0* 21.4*  MCV 89.8  --  89.8 90.6 89.2  PLT 163  --  147* 135* 138*    @IMGRELPRIORS @ Medications:    . aspirin EC  81 mg Oral Daily  . atorvastatin  80 mg Oral Daily  . carvedilol  6.25 mg Oral BID WC  . citalopram  20 mg Oral Daily  . darbepoetin (ARANESP) injection - DIALYSIS  60 mcg Intravenous Q Sat-HD  . ezetimibe  10 mg Oral Daily  . heparin  5,000 Units Subcutaneous Q8H  . insulin aspart  0-5 Units Subcutaneous QHS  . insulin aspart  0-9 Units Subcutaneous TID WC     Madelon Lips, MD [facility] pgr [phone] 11/01/2016, 8:20 AM

## 2016-11-02 ENCOUNTER — Inpatient Hospital Stay (HOSPITAL_COMMUNITY): Payer: Medicare Other

## 2016-11-02 DIAGNOSIS — I214 Non-ST elevation (NSTEMI) myocardial infarction: Secondary | ICD-10-CM

## 2016-11-02 LAB — COMPREHENSIVE METABOLIC PANEL
ALT: 122 U/L — ABNORMAL HIGH (ref 17–63)
AST: 129 U/L — ABNORMAL HIGH (ref 15–41)
Albumin: 3 g/dL — ABNORMAL LOW (ref 3.5–5.0)
Alkaline Phosphatase: 143 U/L — ABNORMAL HIGH (ref 38–126)
Anion gap: 16 — ABNORMAL HIGH (ref 5–15)
BUN: 101 mg/dL — ABNORMAL HIGH (ref 6–20)
CHLORIDE: 94 mmol/L — AB (ref 101–111)
CO2: 20 mmol/L — ABNORMAL LOW (ref 22–32)
Calcium: 8.7 mg/dL — ABNORMAL LOW (ref 8.9–10.3)
Creatinine, Ser: 6.1 mg/dL — ABNORMAL HIGH (ref 0.61–1.24)
GFR, EST AFRICAN AMERICAN: 10 mL/min — AB (ref >60)
GFR, EST NON AFRICAN AMERICAN: 9 mL/min — AB (ref >60)
Glucose, Bld: 195 mg/dL — ABNORMAL HIGH (ref 65–99)
POTASSIUM: 5.1 mmol/L (ref 3.5–5.1)
Sodium: 130 mmol/L — ABNORMAL LOW (ref 135–145)
Total Bilirubin: 0.9 mg/dL (ref 0.3–1.2)
Total Protein: 6.4 g/dL — ABNORMAL LOW (ref 6.5–8.1)

## 2016-11-02 LAB — CBC
HCT: 23.7 % — ABNORMAL LOW (ref 39.0–52.0)
Hemoglobin: 7.6 g/dL — ABNORMAL LOW (ref 13.0–17.0)
MCH: 29 pg (ref 26.0–34.0)
MCHC: 32.1 g/dL (ref 30.0–36.0)
MCV: 90.5 fL (ref 78.0–100.0)
PLATELETS: 161 10*3/uL (ref 150–400)
RBC: 2.62 MIL/uL — ABNORMAL LOW (ref 4.22–5.81)
RDW: 13.7 % (ref 11.5–15.5)
WBC: 9.1 10*3/uL (ref 4.0–10.5)

## 2016-11-02 LAB — GLUCOSE, CAPILLARY
Glucose-Capillary: 145 mg/dL — ABNORMAL HIGH (ref 65–99)
Glucose-Capillary: 199 mg/dL — ABNORMAL HIGH (ref 65–99)
Glucose-Capillary: 280 mg/dL — ABNORMAL HIGH (ref 65–99)

## 2016-11-02 LAB — CK TOTAL AND CKMB (NOT AT ARMC)
CK TOTAL: 138 U/L (ref 49–397)
CK, MB: 12.2 ng/mL — AB (ref 0.5–5.0)
Relative Index: 8.8 — ABNORMAL HIGH (ref 0.0–2.5)

## 2016-11-02 LAB — TROPONIN I
TROPONIN I: 4.2 ng/mL — AB (ref <0.03)
TROPONIN I: 3.83 ng/mL — AB (ref <0.03)
TROPONIN I: 3.61 ng/mL — AB (ref <0.03)
TROPONIN I: 3.22 ng/mL — AB (ref <0.03)

## 2016-11-02 LAB — HEPARIN LEVEL (UNFRACTIONATED)
HEPARIN UNFRACTIONATED: 0.34 [IU]/mL (ref 0.30–0.70)
HEPARIN UNFRACTIONATED: 0.34 [IU]/mL (ref 0.30–0.70)

## 2016-11-02 LAB — PREPARE RBC (CROSSMATCH)

## 2016-11-02 MED ORDER — SODIUM CHLORIDE 0.9 % IV SOLN: 250.0000 mL | INTRAVENOUS | Status: DC | PRN

## 2016-11-02 MED ORDER — SODIUM CHLORIDE 0.9% FLUSH
3.0000 mL | Freq: Two times a day (BID) | INTRAVENOUS | Status: DC
  Administered 2016-11-02: 3 mL via INTRAVENOUS

## 2016-11-02 MED ORDER — ASPIRIN 81 MG PO CHEW
81.0000 mg | CHEWABLE_TABLET | ORAL | Status: AC
  Administered 2016-11-03: 81 mg via ORAL
  Filled 2016-11-02: qty 1

## 2016-11-02 MED ORDER — SODIUM CHLORIDE 0.9 % IV SOLN
INTRAVENOUS | Status: DC
  Administered 2016-11-03: 06:00:00 via INTRAVENOUS

## 2016-11-02 MED ORDER — CLOPIDOGREL BISULFATE 75 MG PO TABS
300.0000 mg | ORAL_TABLET | Freq: Once | ORAL | Status: AC
  Administered 2016-11-02: 300 mg via ORAL
  Filled 2016-11-02: qty 4

## 2016-11-02 MED ORDER — RENA-VITE PO TABS
1.0000 | ORAL_TABLET | Freq: Every day | ORAL | Status: DC
  Administered 2016-11-02 – 2016-11-04 (×3): 1 via ORAL
  Filled 2016-11-02 (×3): qty 1

## 2016-11-02 MED ORDER — CLOPIDOGREL BISULFATE 75 MG PO TABS
75.0000 mg | ORAL_TABLET | Freq: Every day | ORAL | Status: DC
  Administered 2016-11-04 – 2016-11-05 (×2): 75 mg via ORAL
  Filled 2016-11-02 (×2): qty 1

## 2016-11-02 MED ORDER — SODIUM CHLORIDE 0.9% FLUSH: 3.0000 mL | INTRAVENOUS | Status: DC | PRN

## 2016-11-02 MED ORDER — DARBEPOETIN ALFA 200 MCG/0.4ML IJ SOSY: 200.0000 ug | PREFILLED_SYRINGE | INTRAMUSCULAR | Status: DC

## 2016-11-02 MED ORDER — CLOPIDOGREL BISULFATE 75 MG PO TABS
75.0000 mg | ORAL_TABLET | ORAL | Status: AC
  Administered 2016-11-03: 75 mg via ORAL
  Filled 2016-11-02: qty 1

## 2016-11-02 NOTE — Progress Notes (Addendum)
Inpatient Diabetes Program Recommendations  AACE/ADA: New Consensus Statement on Inpatient Glycemic Control (2015)  Target Ranges:  Prepandial:   less than 140 mg/dL      Peak postprandial:   less than 180 mg/dL (1-2 hours)      Critically ill patients:  140 - 180 mg/dL   Lab Results  Component Value Date   GLUCAP 251 (H) 11/01/2016   HGBA1C 7.7 (H) 10/31/2016    Review of Glycemic Control Results for Kenneth [name], Kenneth [name] (MRN [id number]) as of 11/02/2016 11:50  Ref. Range 10/31/2016 21:43 11/01/2016 07:35 11/01/2016 12:00 11/01/2016 16:25 11/01/2016 21:02  Glucose-Capillary Latest Ref Range: 65 - 99 mg/dL 176 (H) 146 (H) 176 (H) 213 (H) 251 (H)   Diabetes history: DM Outpatient Diabetes medications: Insulin pump with last admission settings 21.6 units basal/24 hrs + carbohydrate ratio 1 unit:10-12 gms carbohydrate Current orders for Inpatient glycemic control: Novolog correction 0-9 units tid + 0-5 units hs  Inpatient Diabetes Program Recommendations:  Noted patient currently in hemodialysis.  Please consider adding basal insulin Levemir 10 units qd until restarted on insulin pump. Will follow.  Thank you, Nani Gasser. Teja Judice, RN, MSN, CDE Inpatient Glycemic Control Team Team Pager [phone] (8am-5pm) 11/02/2016 11:53 AM

## 2016-11-02 NOTE — Progress Notes (Signed)
Notified by CCMD patient has converted back to SB patient resting, no complaints

## 2016-11-02 NOTE — Progress Notes (Signed)
CRITICAL VALUE ALERT  Critical value received:  Troponin 4.20  Date of notification:  02 Nov 2016  Time of notification:  0405  Critical value read back:Yes.    Nurse who received alert:  Corie Chiquito  MD notified (1st page):  J kim  Time of first page:  0410  MD notified (2nd page):  Time of second page:  Responding MD:  Georges Mouse  Time MD responded: 1840  Pt SB 48-52, feels poorly 1 episode of nausea MD aware awaiting orders

## 2016-11-02 NOTE — Progress Notes (Signed)
ANTICOAGULATION CONSULT NOTE - Follow Up Consult  Pharmacy Consult for Heparin  Indication: atrial fibrillation  Allergies  Allergen Reactions  . Hydralazine Rash   Patient Measurements: Height: 5\' 6"  (167.6 cm) Weight: 136 lb 11 oz (62 kg) IBW/kg (Calculated) : 63.8  Vital Signs: Temp: 98.5 F (36.9 C) (02/19 0246) Temp Source: Oral (02/19 0246) BP: 102/49 (02/19 0246) Pulse Rate: 50 (02/19 0246)  Labs:  Recent Labs  10/31/16 0332 10/31/16 0846 10/31/16 1436 11/01/16 0226 11/01/16 1715 11/02/16 0215  HGB 7.9* 7.9*  --  7.2*  --  7.6*  HCT 23.9* 24.0*  --  21.4*  --  23.7*  PLT 147* 135*  --  138*  --  161  HEPARINUNFRC  --   --   --   --   --  0.34  CREATININE 6.19*  6.08* 6.30*  --   --  5.53*  --   TROPONINI 0.09* 0.09* 0.08*  --   --   --     Estimated Creatinine Clearance: 12.6 mL/min (by C-G formula based on SCr of 5.53 mg/dL (H)).  Assessment: 61 y/o M on heparin for afib, initial heparin level is therapeutic, Hgb low/stable  Goal of Therapy:  Heparin level 0.3-0.7 units/ml Monitor platelets by anticoagulation protocol: Yes   Plan:  -Cont heparin 850 units/hr -1200 HL  Narda Bonds 11/02/2016,3:45 AM

## 2016-11-02 NOTE — Consult Note (Signed)
Date: 11/02/2016               Patient Name:  Kenneth [name] MRN: [id number]  DOB: [birth year] Age / Sex: 61 y.o., male   PCP: [name], MD         Requesting Physician: Dr. Cherene Altes, MD    Consulting Reason:  Elevated troponin      Chief Complaint: Nausea  History of Present Illness: Patient is a 61 year old male with a past medical history coronary artery disease, NSTEMI in September 2017 status post CABG, paroxysmal A. Fib (only noted post-op CABG), chronic systolic CHF, diabetes type 2, end-stage renal disease on hemodialysis admitted to the hospital on 10/30/2016 for acute encephalopathy 2/2 uremia and acute pulmonary edema. Cardiology has been consulted for elevated troponin (4.2) and patient experiencing chest pain at around 2:20 AM this morning which resolved on its own. Patient reports having chest pain overnight. Denies having any shortness of breath or diaphoresis. Reports having an episode of vomiting after drinking juice at around 3 AM this morning. States he is still feeling nauseous. Does report having occasional chest pain at home since his CABG in September 2017. Reports having chest pain on average 10 times a month; 4-5 times per day or sometimes the entire day. Reports having chest pain only with exertion; not at rest. He is not able to describe the character of his pain but does state that the pain is non-radiating, substernal in location, and 2-3/10 in intensity. Denies having any associated nausea, vomiting, shortness of breath, or diaphoresis during these episodes of chest pain. Denies having any palpitations. Does report having occasional orthopnea but denies having any lower extremity edema. Denies having any GERD symptoms.  Since admission, troponin 0.09 > 0.09 > 0.08 > 4.2 > 3.83. EKG done at 4:33 AM this morning showing T wave inversions in lead 1, V4-6 (similar to prior tracing from admission). His BP was soft - low normal and pulse in the 40s-50s overnight.  Echo in January 2018 showing EF 60-65% and grade 2 diastolic dysfunction.  Meds: Current Facility-Administered Medications  Medication Dose Route Frequency Provider Last Rate Last Dose  . 0.9 %  sodium chloride infusion  100 mL Intravenous PRN Madelon Lips, MD      . 0.9 %  sodium chloride infusion  100 mL Intravenous PRN Madelon Lips, MD      . acetaminophen (TYLENOL) tablet 650 mg  650 mg Oral Q6H PRN Toy Baker, MD   650 mg at 11/02/16 0341   Or  . acetaminophen (TYLENOL) suppository 650 mg  650 mg Rectal Q6H PRN Toy Baker, MD      . alteplase (CATHFLO ACTIVASE) injection 2 mg  2 mg Intracatheter Once PRN Madelon Lips, MD      . aspirin EC tablet 81 mg  81 mg Oral Daily Cherene Altes, MD   81 mg at 11/01/16 0809  . atorvastatin (LIPITOR) tablet 80 mg  80 mg Oral Daily Toy Baker, MD   80 mg at 11/01/16 0809  . citalopram (CELEXA) tablet 20 mg  20 mg Oral Daily Toy Baker, MD   20 mg at 11/01/16 0809  . [START ON 11/07/2016] Darbepoetin Alfa (ARANESP) injection 200 mcg  200 mcg Intravenous Q Sat-HD Mauricia Area, MD      . ezetimibe (ZETIA) tablet 10 mg  10 mg Oral Daily Toy Baker, MD   10 mg at 11/01/16 0809  . ferric gluconate (NULECIT) 125 mg in sodium  chloride 0.9 % 100 mL IVPB  125 mg Intravenous Q M,W,F-HD Madelon Lips, MD      . heparin ADULT infusion 100 units/mL (25000 units/256mL sodium chloride 0.45%)  850 Units/hr Intravenous Continuous Cherene Altes, MD 8.5 mL/hr at 11/02/16 0600 850 Units/hr at 11/02/16 0600  . heparin injection 1,000 Units  1,000 Units Dialysis PRN Madelon Lips, MD      . insulin aspart (novoLOG) injection 0-5 Units  0-5 Units Subcutaneous QHS Cherene Altes, MD   3 Units at 11/01/16 2118  . insulin aspart (novoLOG) injection 0-9 Units  0-9 Units Subcutaneous TID WC Cherene Altes, MD   3 Units at 11/01/16 1700  . lidocaine (PF) (XYLOCAINE) 1 % injection 5 mL  5 mL Intradermal PRN Madelon Lips, MD      . lidocaine-prilocaine (EMLA) cream 1 application  1 application Topical PRN Madelon Lips, MD      . multivitamin (RENA-VIT) tablet 1 tablet  1 tablet Oral QHS Mauricia Area, MD      . ondansetron [facility]) tablet 4 mg  4 mg Oral Q6H PRN Toy Baker, MD       Or  . ondansetron (ZOFRAN) injection 4 mg  4 mg Intravenous Q6H PRN Toy Baker, MD   4 mg at 11/02/16 0350  . pentafluoroprop-tetrafluoroeth (GEBAUERS) aerosol 1 application  1 application Topical PRN Madelon Lips, MD        Allergies: Allergies as of 10/30/2016 - Review Complete 10/30/2016  Allergen Reaction Noted  . Hydralazine Rash 06/11/2016   Past Medical History:  Diagnosis Date  . Acute pulmonary edema ([place]) 05/31/2016  . Acute respiratory failure with hypoxemia ([place]) 05/31/2016  . AKI (acute kidney injury) ([place])   . CHF (congestive heart failure) ([place])   . 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 HCAP (healthcare-associated pneumonia)   . Ischemic rest pain of lower extremity ([place]) 02/06/2015  . 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 NSTEMI (non-ST elevated myocardial infarction) ([place])   . Pain in lower limb 02/06/2015  . Pain in the chest   . Pleural effusion   . 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 Shortness of breath    Past Surgical History:  Procedure Laterality Date  . AV FISTULA PLACEMENT Left 06/22/2016   Procedure: ARTERIOVENOUS (AV) FISTULA CREATION LEFT UPPER ARM;  Surgeon: Rosetta Posner, MD;  Location: [place];  Service: Vascular;  Laterality: Left;  . CARDIAC CATHETERIZATION N/A 05/31/2016   Procedure: Left Heart Cath and Coronary Angiography;  Surgeon: Jettie Booze, MD;  Location: [place] [facility];  Service: Cardiovascular;  Laterality: N/A;  . CARDIAC CATHETERIZATION N/A 05/31/2016   Procedure: Right Heart Cath;  Surgeon: Jettie Booze, MD;  Location: [place] [facility];  Service: Cardiovascular;  Laterality: N/A;  . CARDIAC  CATHETERIZATION N/A 05/31/2016   Procedure: IABP Insertion;  Surgeon: Jettie Booze, MD;  Location: [place] [facility];  Service: Cardiovascular;  Laterality: N/A;  . CORONARY ARTERY BYPASS GRAFT N/A 06/05/2016   Procedure: CORONARY ARTERY BYPASS GRAFTING (CABG) x3 LIMA to LAD -SVG to OM -SVG to RCA;  Surgeon: Ivin Poot, MD;  Location: [place];  Service: Open Heart Surgery;  Laterality: N/A;  . INSERTION OF DIALYSIS CATHETER N/A 06/05/2016   Procedure: INSERTION OF DIALYSIS/trialysis CATHETER;  Surgeon: Ivin Poot, MD;  Location: [place];  Service: Vascular;  Laterality: N/A;  . INSERTION OF DIALYSIS CATHETER Right 06/13/2016   Procedure: INSERTION OF  DIALYSIS CATHETER RIGHT INTERNAL JUGULAR;  Surgeon: Conrad [place], MD;  Location: [place];  Service: Vascular;  Laterality: Right;  . INTRAOPERATIVE TRANSESOPHAGEAL ECHOCARDIOGRAM N/A 06/05/2016   Procedure: INTRAOPERATIVE TRANSESOPHAGEAL ECHOCARDIOGRAM;  Surgeon: Ivin Poot, MD;  Location: [place];  Service: Open Heart Surgery;  Laterality: N/A;   Family History  Problem Relation Age of Onset  . CAD Father   . Colon cancer Father   . Diabetes Brother    Social History   Social History  . Marital status: Married    Spouse name: N/A  . Number of children: N/A  . Years of education: N/A   Occupational History  . Not on file.   Social History Main Topics  . Smoking status: Former Research scientist (life sciences)  . Smokeless tobacco: Never Used  . Alcohol use No  . Drug use: No  . Sexual activity: Not on file   Other Topics Concern  . Not on file   Social History Narrative  . No narrative on file    Review of Systems: Pertinent positives mentioned in HPI. Remainder of all ROS negative.   Physical Exam: Blood pressure (!) 97/43, pulse (!) 56, temperature 98.2 F (36.8 C), temperature source Oral, resp. rate 18, height 5\' 6"  (1.676 m), weight 136 lb 11 oz (62 kg), SpO2 96 %. Physical Exam  Constitutional: He is oriented to person, place, and  time. He appears well-developed and well-nourished. No distress.  HENT:  Head: Normocephalic and atraumatic.  Eyes: EOM are normal.  Neck: JVD present.  Cardiovascular: Normal rate, regular rhythm and intact distal pulses.  Exam reveals no gallop and no friction rub.   No murmur heard. Pulmonary/Chest: Effort normal and breath sounds normal. No respiratory distress. He has no wheezes. He has no rales.  Abdominal: Soft. Bowel sounds are normal. He exhibits no distension. There is no tenderness.  Musculoskeletal: He exhibits no edema.  Neurological: He is alert and oriented to person, place, and time.  Skin: Skin is warm and dry.    Lab results: Basic Metabolic Panel:  Recent Labs  10/31/16 0332 10/31/16 0846 11/01/16 1715 11/02/16 0215  NA 135 136 130* 130*  K 4.7 4.4 4.7 5.1  CL 99* 99* 94* 94*  CO2 22 19* 22 20*  GLUCOSE 198* 150* 229* 195*  BUN 128* 129* 96* 101*  CREATININE 6.19*  6.08* 6.30* 5.53* 6.10*  CALCIUM 9.0 9.1 8.4* 8.7*  MG  --  2.5* 2.4  --   PHOS 5.7* 5.6*  --   --    Liver Function Tests:  Recent Labs  10/31/16 0846 11/02/16 0215  AST 42* 129*  ALT 69* 122*  ALKPHOS 105 143*  BILITOT 1.1 0.9  PROT 6.6 6.4*  ALBUMIN 3.2* 3.0*    Recent Labs  10/30/16 2059  AMMONIA 14   CBC:  Recent Labs  10/30/16 2024  11/01/16 0226 11/02/16 0215  WBC 9.3  < > 3.4* 9.1  NEUTROABS 6.2  --   --   --   HGB 8.7*  < > 7.2* 7.6*  HCT 26.4*  < > 21.4* 23.7*  MCV 89.8  < > 89.2 90.5  PLT 163  < > 138* 161  < > = values in this interval not displayed. Cardiac Enzymes:  Recent Labs  10/31/16 1436 11/02/16 0215 11/02/16 0525  CKTOTAL  --   --  138  CKMB  --   --  12.2*  TROPONINI 0.08* 4.20* 3.83*   CBG:  Recent Labs  10/31/16 1755 10/31/16 2143 11/01/16 0735 11/01/16 1200 11/01/16 1625 11/01/16 2102  GLUCAP 159* 176* 146* 176* 213* 251*   Hemoglobin A1C:  Recent Labs  10/31/16 0332  HGBA1C 7.7*   Thyroid Function Tests:  Recent  Labs  10/31/16 0332 11/01/16 0226  TSH 5.521*  --   FREET4  --  1.01   Anemia Panel:  Recent Labs  10/31/16 0846  FERRITIN 491*  TIBC 246*  IRON 30*   Urine Drug Screen: Drugs of Abuse     Component Value Date/Time   LABOPIA POSITIVE (A) 10/30/2016 2344   COCAINSCRNUR NONE DETECTED 10/30/2016 2344   LABBENZ NONE DETECTED 10/30/2016 2344   AMPHETMU NONE DETECTED 10/30/2016 2344   THCU NONE DETECTED 10/30/2016 2344   LABBARB NONE DETECTED 10/30/2016 2344    Alcohol Level:  Recent Labs  10/30/16 2059  ETH <5   Urinalysis:  Recent Labs  10/30/16 2339  COLORURINE YELLOW  LABSPEC 1.010  PHURINE 5.0  GLUCOSEU NEGATIVE  HGBUR NEGATIVE  BILIRUBINUR NEGATIVE  KETONESUR NEGATIVE  PROTEINUR 100*  NITRITE NEGATIVE  LEUKOCYTESUR NEGATIVE   Imaging results:  No results found.  Other results: EKG: TWI in lead 1 (new), TWI and ST segment changes in V4-6 (appear worse compared to prior EKG from admission).   Telemetry: NSR with TWI and ectopic beats. No afib noted.   Assessment, Plan, & Recommendations by Problem: Active Problems:   Acute pulmonary edema (HCC)   CHF (congestive heart failure) (HCC)   ESRD (end stage renal disease) (HCC)   Acute encephalopathy   Uremia   CAD (coronary artery disease)   DM (diabetes mellitus), type 2 with renal complications (HCC)   Elevated troponin  Coronary artery disease: Patient has CP overnight with elevated troponin (4.2). Troponin downtrended to 3.83. EKG with new TWI in lead 1 and worsening TWI/ ST segment changes in lateral leads (V4-6). He does have a history of NSTEMI in 05/2016 s/p CABG 3 (LIMA-LAD, SVG-OM, SVG-RCA). Patient was started on Heparin gtt by the primary team. There is concern for an acute coronary event considering patient's history and EKG changes. Currently hemodynamically stable. Planning for cardiac cath tomorrow.  -Continue aspirin -Give Plavix 300 mg loading dose today, then 75 mg daily starting  tomorrow  -Continue IV heparin -Continue atorvastatin -Keep NPO after midnight   Hyperlipidemia: Continue home meds zetia and atorvastatin.  Paroxysmal A. Fib: CHA2DS2VASc is 3. Only noted post-op CABG in 05/2016. TSH mildly high at 5.5 with normal free T4 consistent with subclinical hypothyroidism. No afib noted on review of telemetry.  -Patient does not need long-term anticoagulation at this time.     Chronic systolic heart failure: Ischemic cardiomyopathy. Echo in January 2018 showing EF 60-65% and grade 2 diastolic dysfunction. -Hold home Lasix. BP soft - low normal overnight. Patient is undergoing HD this am.  -Hold home Coreg in the setting of bradycardia   Hypertension: BP soft- low normal. Patient is undergoing HD this am.  -Hold home hydralazine -Hold home Lasix  End-stage renal disease on hemodialysis: Presenting with acute encephalopathy 2/2 uremia. Patient undergoing HD this am. -Nephrology following  Signed: Shela Leff, MD 11/02/2016, 8:59 AM   Patient seen, examined. Available data reviewed. Agree with findings, assessment, and plan as outlined by Dr Marlowe Sax. The patient is independently interviewed and examined. He is somnolent but easily arousable. He responds appropriately to all questions and follows commands. Jugular venous pressure is normal. Lungs are clear to auscultation bilaterally. There are soft bilateral carotid  bruits. Heart is regular rate and rhythm without murmur or gallop. Abdomen is soft and nontender. There is no peripheral edema.  Laboratory data reviewed and demonstrates significant troponin elevation. His initial troponins are unremarkable, but his episode of chest pain last night is associated with a troponin up to 4.2 mg/dL (now trending down) with worsening ST depression in his lateral EKG leads.  I discussed diagnostic options with the patient, his family members, and church members who are at the bedside. Cardiac catheterization is  indicated. I think the patient is at high risk of vein graft closure after his recent CABG with clear evidence of non-ST elevation MI this admission. He will be loaded with Plavix 300 mg today and 75 mg daily starting tomorrow. He should be continued on IV heparin and a statin drug. His blood pressure will not allow for aggressive antianginal therapy with nitrates or beta blockers. Will tentatively plan on cardiac catheterization via a right femoral approach tomorrow. I have reviewed the risks, indications, and alternatives to cardiac catheterization, possible angioplasty, and stenting with the patient. Risks include but are not limited to bleeding, infection, vascular injury, stroke, myocardial infection, arrhythmia, kidney injury, radiation-related injury in the case of prolonged fluoroscopy use, emergency cardiac surgery, and death. The patient understands the risks of serious complication is 1-2 in 1886 with diagnostic cardiac cath and 1-2% or less with angioplasty/stenting.   Sherren Mocha, M.D. 11/02/2016 3:45 PM

## 2016-11-02 NOTE — Procedures (Signed)
I was present at this session.  I have reviewed the session itself and made appropriate changes.  HD via LUA AVF. Using 17 ga needles.  Low flows,   Tommye Lehenbauer L 2/19/20188:23 AM

## 2016-11-02 NOTE — Progress Notes (Signed)
[facility] [place] TEAM 1 - Stepdown/ICU TEAM  Kenneth [name]  YWV:[id number] DOB: Feb 06, 1956 DOA: 10/30/2016 PCP: [name], MD    Brief Narrative:  61 y.o. male with history of DM2, CKD stage IV requiring HD for a period of time, CAD s/p CABG, and chronic systolic CHF who presented with worsening somnolence and confusion with progressive leg edema and abdom distention for a few weeks.  A plan was in place for him to resume dialysis as an outpatient on 11/02/2016, as increasing doses of Lasix had failed to improve his symptoms.   Subjective: The patient continues to report nausea and a generalized feeling of unwellness.  He denies substernal chest pressure or shortness of breath at this time.  Assessment & Plan:  ESRD - Weakness and SOB restart dialysis per Nephrology - BUN > 120 at presentation   NSTEMI - CAD s/p CABG x2 Sept 2017 Heparin gtt started last night - no BB due to hypotension and periods of bradycardia w/ cardiac pauses - already on ASA + atorvastatin - Cards now following - being loaded w/ Plavix, w/ plan for cardiac cath 2/20 - denies chest pain at present   Parox Afib  Occurred transiently at time of his CABG in Sept 2017 but was thought to only be transient - recurred last night - Cards now following - in sinus brady presently - CHA2DS2-VASc is 3 - on heparin gtt presently   Pauses on tele  RN reporting 2 episodes of pauses on tele 2/18 (3 seconds and 4 seconds) - no electrolyte abnormalities to explain it - cont to follow on tele    HTN / Hypotension previous EDW 59.5 kg - experienced some hypotension into the 80s last evening - appears this has been occurring intermittently th/o his hospital stay - BP stable presently - follow  Filed Weights   11/02/16 0441 11/02/16 0755 11/02/16 1124  Weight: 62.3 kg (137 lb 5.6 oz) 62 kg (136 lb 11 oz) 61.5 kg (135 lb 9.3 oz)    Transaminitis  Mild - likely due to decreased perfusion in setting of episodic bradycardia and  hypotension - follow trend   Anemia of chronic kidney disease EPO +/- Fe per Nephrology - transfused 1U PRBC in HD today   Elevated TSH FT4 normal - recheck TSH in 8-12 weeks when patient not acutely ill/hospitalized   DM2 CBG variable - intake limited - follow w/o change today   DVT prophylaxis: IV heparin  Code Status: FULL CODE Family Communication: spoke w/ wife at bedside via friends interpreting  Disposition Plan: SDU  Consultants:  Nephrology  [facility]   Procedures: none  Antimicrobials:  Anti-infectives    None     Objective: Blood pressure 131/63, pulse 61, temperature 98.7 F (37.1 C), temperature source Oral, resp. rate (!) 21, height 5\' 6"  (1.676 m), weight 61.5 kg (135 lb 9.3 oz), SpO2 97 %.  Intake/Output Summary (Last 24 hours) at 11/02/16 1609 Last data filed at 11/02/16 1520  Gross per 24 hour  Intake          1035.14 ml  Output              505 ml  Net           530.14 ml   Filed Weights   11/02/16 0441 11/02/16 0755 11/02/16 1124  Weight: 62.3 kg (137 lb 5.6 oz) 62 kg (136 lb 11 oz) 61.5 kg (135 lb 9.3 oz)    Examination: General: No  acute respiratory distress Lungs: Clear to auscultation bilaterally without wheezing Cardiovascular: Bradycardic but regular without murmur or gallop Abdomen: Nontender, nondistended, soft, bowel sounds positive, no rebound, no ascites, no appreciable mass Extremities: No significant edema bilateral lower extremities   CBC:  Recent Labs Lab 10/30/16 2024 10/30/16 2048 10/31/16 0332 10/31/16 0846 11/01/16 0226 11/02/16 0215  WBC 9.3  --  9.5 9.9 3.4* 9.1  NEUTROABS 6.2  --   --   --   --   --   HGB 8.7* 8.8* 7.9* 7.9* 7.2* 7.6*  HCT 26.4* 26.0* 23.9* 24.0* 21.4* 23.7*  MCV 89.8  --  89.8 90.6 89.2 90.5  PLT 163  --  147* 135* 138* 742   Basic Metabolic Panel:  Recent Labs Lab 10/30/16 2024 10/30/16 2048 10/31/16 0332 10/31/16 0846 11/01/16 1715 11/02/16 0215  NA 133* 134* 135 136 130*  130*  K 5.5* 5.4* 4.7 4.4 4.7 5.1  CL 99* 99* 99* 99* 94* 94*  CO2 19*  --  22 19* 22 20*  GLUCOSE 202* 208* 198* 150* 229* 195*  BUN 127* 121* 128* 129* 96* 101*  CREATININE 6.04* 7.10* 6.19*  6.08* 6.30* 5.53* 6.10*  CALCIUM 9.0  --  9.0 9.1 8.4* 8.7*  MG  --   --   --  2.5* 2.4  --   PHOS  --   --  5.7* 5.6*  --   --    GFR: Estimated Creatinine Clearance: 11.3 mL/min (by C-G formula based on SCr of 6.1 mg/dL (H)).  Liver Function Tests:  Recent Labs Lab 10/30/16 2024 10/31/16 0332 10/31/16 0846 11/02/16 0215  AST 78*  --  42* 129*  ALT 87*  --  69* 122*  ALKPHOS 104  --  105 143*  BILITOT 0.7  --  1.1 0.9  PROT 7.0  --  6.6 6.4*  ALBUMIN 3.4* 3.1* 3.2* 3.0*    Recent Labs Lab 10/30/16 2059  AMMONIA 14   Cardiac Enzymes:  Recent Labs Lab 10/31/16 0846 10/31/16 1436 11/02/16 0215 11/02/16 0525 11/02/16 1336  CKTOTAL  --   --   --  138  --   CKMB  --   --   --  12.2*  --   TROPONINI 0.09* 0.08* 4.20* 3.83* 3.61*    HbA1C: Hgb A1c MFr Bld  Date/Time Value Ref Range Status  10/31/2016 03:32 AM 7.7 (H) 4.8 - 5.6 % Final    Comment:    (NOTE)         Pre-diabetes: 5.7 - 6.4         Diabetes: >6.4         Glycemic control for adults with diabetes: <7.0   06/04/2016 04:30 PM 10.4 (H) 4.8 - 5.6 % Final    Comment:    (NOTE)         Pre-diabetes: 5.7 - 6.4         Diabetes: >6.4         Glycemic control for adults with diabetes: <7.0     CBG:  Recent Labs Lab 11/01/16 1200 11/01/16 1625 11/01/16 2102 11/02/16 1248 11/02/16 1550  GLUCAP 176* 213* 251* 145* 199*    Recent Results (from the past 240 hour(s))  Blood Culture (routine x 2)     Status: None (Preliminary result)   Collection Time: 10/30/16  8:44 PM  Result Value Ref Range Status   Specimen Description BLOOD RIGHT ANTECUBITAL  Final   Special Requests BOTTLES DRAWN AEROBIC AND  ANAEROBIC 5CC  Final   Culture NO GROWTH 3 DAYS  Final   Report Status PENDING  Incomplete  Blood  Culture (routine x 2)     Status: None (Preliminary result)   Collection Time: 10/30/16  9:03 PM  Result Value Ref Range Status   Specimen Description BLOOD RIGHT HAND  Final   Special Requests BOTTLES DRAWN AEROBIC AND ANAEROBIC 5CC  Final   Culture NO GROWTH 3 DAYS  Final   Report Status PENDING  Incomplete  MRSA PCR Screening     Status: None   Collection Time: 10/31/16  3:26 AM  Result Value Ref Range Status   MRSA by PCR NEGATIVE NEGATIVE Final    Comment:        The GeneXpert MRSA Assay (FDA approved for NASAL specimens only), is one component of a comprehensive MRSA colonization surveillance program. It is not intended to diagnose MRSA infection nor to guide or monitor treatment for MRSA infections.      Scheduled Meds: . aspirin EC  81 mg Oral Daily  . atorvastatin  80 mg Oral Daily  . citalopram  20 mg Oral Daily  . [START ON 11/07/2016] darbepoetin (ARANESP) injection - DIALYSIS  200 mcg Intravenous Q Sat-HD  . ezetimibe  10 mg Oral Daily  . ferric gluconate (FERRLECIT/NULECIT) IV  125 mg Intravenous Q M,W,F-HD  . insulin aspart  0-5 Units Subcutaneous QHS  . insulin aspart  0-9 Units Subcutaneous TID WC  . multivitamin  1 tablet Oral QHS     LOS: 2 days   Cherene Altes, MD [facility] Office  [phone] Pager - Text Page per Shea Evans as per below:  On-Call/Text Page:      [email or web address]      password [email or web address]  If 7PM-7AM, please contact night-coverage [email or web address] Password [email or web address] 11/02/2016, 4:09 PM

## 2016-11-02 NOTE — Care Management Note (Signed)
Case Management Note  Patient Details  Name: CLAIR ALFIERI MRN: [id number] Date of Birth: 1955/12/19  Subjective/Objective:  Presents with uremia, pulmonary edema with severe chronic kidney disease requiring urgent dialysis.  Per pt eval rec HHPT, and rolling walker.  NCM will cont to follow for dc needs.                   Action/Plan:   Expected Discharge Date:                  Expected Discharge Plan:  [place]  In-House Referral:     Discharge planning Services  CM Consult  Post Acute Care Choice:    Choice offered to:     DME Arranged:    DME Agency:     HH Arranged:    [place] Agency:     Status of Service:  In process, will continue to follow  If discussed at Long Length of Stay Meetings, dates discussed:    Additional Comments:  Zenon Mayo, RN 11/02/2016, 10:09 AM

## 2016-11-02 NOTE — Progress Notes (Signed)
Subjective: Interval History: has no complaint .  Objective: Vital signs in last 24 hours: Temp:  [98.1 F (36.7 C)-98.7 F (37.1 C)] 98.3 F (36.8 C) (02/19 0717) Pulse Rate:  [48-112] 49 (02/19 0717) Resp:  [12-17] 17 (02/19 0717) BP: (93-116)/(43-63) 102/46 (02/19 0717) SpO2:  [97 %-100 %] 98 % (02/19 0717) Weight:  [62.3 kg (137 lb 5.6 oz)] 62.3 kg (137 lb 5.6 oz) (02/19 0441) Weight change: 0.9 kg (1 lb 15.8 oz)  Intake/Output from previous day: 02/18 0701 - 02/19 0700 In: 870.8 [P.O.:780; I.V.:90.8] Out: -  Intake/Output this shift: Total I/O In: 10.9 [I.V.:10.9] Out: -   General appearance: alert, cooperative, no distress and pale Resp: rales bilaterally Cardio: S1, S2 normal GI: soft, non-tender; bowel sounds normal; no masses,  no organomegaly and liver down 4 cm Extremities: aVF LUA B&T  Lab Results:  Recent Labs  11/01/16 0226 11/02/16 0215  WBC 3.4* 9.1  HGB 7.2* 7.6*  HCT 21.4* 23.7*  PLT 138* 161   BMET:  Recent Labs  11/01/16 1715 11/02/16 0215  NA 130* 130*  K 4.7 5.1  CL 94* 94*  CO2 22 20*  GLUCOSE 229* 195*  BUN 96* 101*  CREATININE 5.53* 6.10*  CALCIUM 8.4* 8.7*    Recent Labs  10/31/16 0846  PTH 46   Iron Studies:  Recent Labs  10/31/16 0846  IRON 30*  TIBC 246*  FERRITIN 491*    Studies/Results: No results found.  I have reviewed the patient's current medications.  Assessment/Plan: 1 ESRD uremia , for HD.  , vol xs  2 Anemia needs ESA. 3 HPTH vit D 4 HTN lower vol P HD, CLIP,     LOS: 2 days   Mathews Stuhr L 11/02/2016,8:26 AM

## 2016-11-02 NOTE — Progress Notes (Signed)
ANTICOAGULATION CONSULT NOTE - Follow Up Consult  Pharmacy Consult for Heparin  Indication: atrial fibrillation  Patient Measurements: Height: 5\' 6"  (167.6 cm) Weight: 135 lb 9.3 oz (61.5 kg) IBW/kg (Calculated) : 63.8  Vital Signs: Temp: 98 F (36.7 C) (02/19 1226) Temp Source: Oral (02/19 1226) BP: 152/73 (02/19 1226) Pulse Rate: 64 (02/19 1124)  Labs:  Recent Labs  10/31/16 0846 10/31/16 1436 11/01/16 0226 11/01/16 1715 11/02/16 0215 11/02/16 0525  HGB 7.9*  --  7.2*  --  7.6*  --   HCT 24.0*  --  21.4*  --  23.7*  --   PLT 135*  --  138*  --  161  --   HEPARINUNFRC  --   --   --   --  0.34  --   CREATININE 6.30*  --   --  5.53* 6.10*  --   CKTOTAL  --   --   --   --   --  138  CKMB  --   --   --   --   --  12.2*  TROPONINI 0.09* 0.08*  --   --  4.20* 3.83*    Estimated Creatinine Clearance: 11.3 mL/min (by C-G formula based on SCr of 6.1 mg/dL (H)).  Assessment: 59 YOM started on heparin for PAF.  Heparin level this afternoon remains therapeutic (HL 0.34 << 0.34, goal of 0.3-0.7). Hgb low but stable, plts wnl - no bleeding noted.   Goal of Therapy:  Heparin level 0.3-0.7 units/ml Monitor platelets by anticoagulation protocol: Yes   Plan:  1. Continue heparin at the current rate of 850 units/hr (8.5 ml/hr) 2. Will continue to monitor for any signs/symptoms of bleeding and will follow up with heparin level in the a.m.   Thank you for allowing pharmacy to be a part of this patient's care.  Alycia Rossetti, PharmD, BCPS Clinical Pharmacist Pager: [phone] Clinical phone for 11/02/2016 from 7a-3:30p: [phone] If after 3:30p, please call main pharmacy at: [phone] 11/02/2016 2:33 PM

## 2016-11-02 NOTE — Progress Notes (Signed)
OT Cancellation Note  Patient Details Name: Kenneth [name] MRN: [id number] DOB: 03-08-1956   Cancelled Treatment:    Reason Eval/Treat Not Completed: Fatigue/lethargy limiting ability to participate. Pt sleeping following dialysis. Will continue to follow.  [name] 11/02/2016, 2:41 PM  [phone]

## 2016-11-02 NOTE — Progress Notes (Signed)
Patient converted from NSR to AFib rate 90's - 100's no complaints of Chest pain or other changes. Paged MD awaiting response.

## 2016-11-02 NOTE — Progress Notes (Signed)
Paged regarding elevated troponin of 4. 2.  Patient reportedly had complaints of chest pain at around 2:20 a.m. that self resolved. EKG showing ST depressions similar to previous EKG from 2/18. Patient currently chest pain-free. Paged cardiology at 5:20am in regards to the patient as he is currently on a heparin drip, but no callback received.  Will email Gay Filler to have patient placed on list be seen by cardiology for further recommendations. Patient's nurse also made noted to have one episode of nausea and vomiting after drinking Nepro shake earlier that night on 2/18.

## 2016-11-03 ENCOUNTER — Encounter (HOSPITAL_COMMUNITY)
Admission: EM | Disposition: A | Discharge status: Home Health | Payer: Self-pay | Source: Home / Self Care | Attending: Internal Medicine

## 2016-11-03 DIAGNOSIS — I2510 Atherosclerotic heart disease of native coronary artery without angina pectoris: Secondary | ICD-10-CM

## 2016-11-03 DIAGNOSIS — R748 Abnormal levels of other serum enzymes: Secondary | ICD-10-CM

## 2016-11-03 DIAGNOSIS — L8990 Pressure ulcer of unspecified site, unspecified stage: Secondary | ICD-10-CM | POA: Insufficient documentation

## 2016-11-03 HISTORY — PX: LEFT HEART CATH AND CORS/GRAFTS ANGIOGRAPHY: CATH118250

## 2016-11-03 LAB — TYPE AND SCREEN
Blood Product Expiration Date: 201803052359
ISSUE DATE / TIME: 201802191043
UNIT TYPE AND RH: 6200

## 2016-11-03 LAB — COMPREHENSIVE METABOLIC PANEL
ALK PHOS: 192 U/L — AB (ref 38–126)
ALT: 497 U/L — ABNORMAL HIGH (ref 17–63)
ANION GAP: 10 (ref 5–15)
AST: 404 U/L — ABNORMAL HIGH (ref 15–41)
Albumin: 2.7 g/dL — ABNORMAL LOW (ref 3.5–5.0)
BUN: 68 mg/dL — ABNORMAL HIGH (ref 6–20)
CALCIUM: 8.3 mg/dL — AB (ref 8.9–10.3)
CHLORIDE: 95 mmol/L — AB (ref 101–111)
CO2: 25 mmol/L (ref 22–32)
Creatinine, Ser: 5.3 mg/dL — ABNORMAL HIGH (ref 0.61–1.24)
GFR, EST AFRICAN AMERICAN: 12 mL/min — AB (ref >60)
GFR, EST NON AFRICAN AMERICAN: 11 mL/min — AB (ref >60)
Glucose, Bld: 201 mg/dL — ABNORMAL HIGH (ref 65–99)
Potassium: 4.1 mmol/L (ref 3.5–5.1)
SODIUM: 130 mmol/L — AB (ref 135–145)
Total Bilirubin: 0.7 mg/dL (ref 0.3–1.2)
Total Protein: 5.6 g/dL — ABNORMAL LOW (ref 6.5–8.1)

## 2016-11-03 LAB — CBC
HCT: 23.4 % — ABNORMAL LOW (ref 39.0–52.0)
HEMATOCRIT: 22.9 % — AB (ref 39.0–52.0)
Hemoglobin: 8 g/dL — ABNORMAL LOW (ref 13.0–17.0)
Hemoglobin: 7.9 g/dL — ABNORMAL LOW (ref 13.0–17.0)
MCH: 30.3 pg (ref 26.0–34.0)
MCH: 31 pg (ref 26.0–34.0)
MCHC: 34.2 g/dL (ref 30.0–36.0)
MCHC: 34.5 g/dL (ref 30.0–36.0)
MCV: 88.6 fL (ref 78.0–100.0)
MCV: 89.8 fL (ref 78.0–100.0)
PLATELETS: 107 10*3/uL — AB (ref 150–400)
PLATELETS: 97 10*3/uL — AB (ref 150–400)
RBC: 2.64 MIL/uL — ABNORMAL LOW (ref 4.22–5.81)
RBC: 2.55 MIL/uL — ABNORMAL LOW (ref 4.22–5.81)
RDW: 14.1 % (ref 11.5–15.5)
RDW: 14 % (ref 11.5–15.5)
WBC: 8.6 10*3/uL (ref 4.0–10.5)
WBC: 7.6 10*3/uL (ref 4.0–10.5)

## 2016-11-03 LAB — PROTIME-INR
INR: 1.24
PROTHROMBIN TIME: 15.6 s — AB (ref 11.4–15.2)

## 2016-11-03 LAB — RENAL FUNCTION PANEL
Albumin: 2.7 g/dL — ABNORMAL LOW (ref 3.5–5.0)
Anion gap: 11 (ref 5–15)
BUN: 73 mg/dL — AB (ref 6–20)
CHLORIDE: 96 mmol/L — AB (ref 101–111)
CO2: 23 mmol/L (ref 22–32)
Calcium: 8.5 mg/dL — ABNORMAL LOW (ref 8.9–10.3)
Creatinine, Ser: 5.6 mg/dL — ABNORMAL HIGH (ref 0.61–1.24)
GFR, EST AFRICAN AMERICAN: 12 mL/min — AB (ref >60)
GFR, EST NON AFRICAN AMERICAN: 10 mL/min — AB (ref >60)
Glucose, Bld: 173 mg/dL — ABNORMAL HIGH (ref 65–99)
POTASSIUM: 4.4 mmol/L (ref 3.5–5.1)
Phosphorus: 5.9 mg/dL — ABNORMAL HIGH (ref 2.5–4.6)
Sodium: 130 mmol/L — ABNORMAL LOW (ref 135–145)

## 2016-11-03 LAB — POCT ACTIVATED CLOTTING TIME: ACTIVATED CLOTTING TIME: 136 s

## 2016-11-03 LAB — CREATININE, SERUM
Creatinine, Ser: 5.65 mg/dL — ABNORMAL HIGH (ref 0.61–1.24)
GFR calc Af Amer: 11 mL/min — ABNORMAL LOW (ref >60)
GFR, EST NON AFRICAN AMERICAN: 10 mL/min — AB (ref >60)

## 2016-11-03 LAB — GLUCOSE, CAPILLARY
GLUCOSE-CAPILLARY: 152 mg/dL — AB (ref 65–99)
GLUCOSE-CAPILLARY: 183 mg/dL — AB (ref 65–99)
GLUCOSE-CAPILLARY: 298 mg/dL — AB (ref 65–99)
Glucose-Capillary: 223 mg/dL — ABNORMAL HIGH (ref 65–99)

## 2016-11-03 LAB — HEPARIN LEVEL (UNFRACTIONATED): Heparin Unfractionated: 0.33 IU/mL (ref 0.30–0.70)

## 2016-11-03 SURGERY — LEFT HEART CATH AND CORS/GRAFTS ANGIOGRAPHY

## 2016-11-03 MED ORDER — HEPARIN SODIUM (PORCINE) 1000 UNIT/ML DIALYSIS
1000.0000 [IU] | INTRAMUSCULAR | Status: DC | PRN
  Filled 2016-11-03: qty 1

## 2016-11-03 MED ORDER — SODIUM CHLORIDE 0.9 % IV SOLN: 250.0000 mL | INTRAVENOUS | Status: DC | PRN

## 2016-11-03 MED ORDER — ALTEPLASE 2 MG IJ SOLR: 2.0000 mg | Freq: Once | INTRAMUSCULAR | Status: DC | PRN

## 2016-11-03 MED ORDER — LIDOCAINE HCL (PF) 1 % IJ SOLN
INTRAMUSCULAR | Status: DC | PRN
  Administered 2016-11-03: 18 mL

## 2016-11-03 MED ORDER — IOPAMIDOL (ISOVUE-370) INJECTION 76%
INTRAVENOUS | Status: AC
Start: 2016-11-03 — End: 2016-11-03
  Filled 2016-11-03: qty 50

## 2016-11-03 MED ORDER — HEPARIN SODIUM (PORCINE) 5000 UNIT/ML IJ SOLN
5000.0000 [IU] | Freq: Three times a day (TID) | INTRAMUSCULAR | Status: DC
  Administered 2016-11-04 – 2016-11-05 (×3): 5000 [IU] via SUBCUTANEOUS
  Filled 2016-11-03 (×2): qty 1

## 2016-11-03 MED ORDER — SODIUM CHLORIDE 0.9% FLUSH: 3.0000 mL | INTRAVENOUS | Status: DC | PRN

## 2016-11-03 MED ORDER — HEPARIN SODIUM (PORCINE) 1000 UNIT/ML DIALYSIS
100.0000 [IU]/kg | INTRAMUSCULAR | Status: DC | PRN
  Filled 2016-11-03: qty 7

## 2016-11-03 MED ORDER — MIDAZOLAM HCL 2 MG/2ML IJ SOLN
INTRAMUSCULAR | Status: AC
  Filled 2016-11-03: qty 2

## 2016-11-03 MED ORDER — LIDOCAINE-PRILOCAINE 2.5-2.5 % EX CREA: 1.0000 "application " | TOPICAL_CREAM | CUTANEOUS | Status: DC | PRN

## 2016-11-03 MED ORDER — SODIUM CHLORIDE 0.9% FLUSH
3.0000 mL | Freq: Two times a day (BID) | INTRAVENOUS | Status: DC
  Administered 2016-11-03 – 2016-11-05 (×4): 3 mL via INTRAVENOUS

## 2016-11-03 MED ORDER — ACETAMINOPHEN 325 MG PO TABS
650.0000 mg | ORAL_TABLET | ORAL | Status: DC | PRN
Start: 2016-11-03 — End: 2016-11-05

## 2016-11-03 MED ORDER — FENTANYL CITRATE (PF) 100 MCG/2ML IJ SOLN
INTRAMUSCULAR | Status: DC | PRN
  Administered 2016-11-03: 50 ug via INTRAVENOUS

## 2016-11-03 MED ORDER — HEPARIN (PORCINE) IN NACL 2-0.9 UNIT/ML-% IJ SOLN
INTRAMUSCULAR | Status: AC
  Filled 2016-11-03: qty 1000

## 2016-11-03 MED ORDER — FENTANYL CITRATE (PF) 100 MCG/2ML IJ SOLN
INTRAMUSCULAR | Status: AC
  Filled 2016-11-03: qty 2

## 2016-11-03 MED ORDER — HEPARIN (PORCINE) IN NACL 2-0.9 UNIT/ML-% IJ SOLN
INTRAMUSCULAR | Status: DC | PRN
  Administered 2016-11-03: 1000 mL

## 2016-11-03 MED ORDER — LIDOCAINE HCL (PF) 1 % IJ SOLN: 5.0000 mL | INTRAMUSCULAR | Status: DC | PRN

## 2016-11-03 MED ORDER — IOPAMIDOL (ISOVUE-370) INJECTION 76%
INTRAVENOUS | Status: AC
  Filled 2016-11-03: qty 125

## 2016-11-03 MED ORDER — MIDAZOLAM HCL 2 MG/2ML IJ SOLN
INTRAMUSCULAR | Status: DC | PRN
  Administered 2016-11-03: 2 mg via INTRAVENOUS

## 2016-11-03 MED ORDER — ASPIRIN 81 MG PO CHEW
81.0000 mg | CHEWABLE_TABLET | Freq: Every day | ORAL | Status: DC
  Administered 2016-11-04 – 2016-11-05 (×2): 81 mg via ORAL
  Filled 2016-11-03 (×2): qty 1

## 2016-11-03 MED ORDER — ATORVASTATIN CALCIUM 80 MG PO TABS
80.0000 mg | ORAL_TABLET | Freq: Every day | ORAL | Status: DC
  Administered 2016-11-03 – 2016-11-04 (×2): 80 mg via ORAL
  Filled 2016-11-03 (×3): qty 1

## 2016-11-03 MED ORDER — SODIUM CHLORIDE 0.9 % IV SOLN: 100.0000 mL | INTRAVENOUS | Status: DC | PRN

## 2016-11-03 MED ORDER — PENTAFLUOROPROP-TETRAFLUOROETH EX AERO: 1.0000 "application " | INHALATION_SPRAY | CUTANEOUS | Status: DC | PRN

## 2016-11-03 MED ORDER — IOPAMIDOL (ISOVUE-370) INJECTION 76%
INTRAVENOUS | Status: DC | PRN
Start: 2016-11-03 — End: 2016-11-03
  Administered 2016-11-03: 135 mL via INTRA_ARTERIAL

## 2016-11-03 MED ORDER — ONDANSETRON HCL 4 MG/2ML IJ SOLN: 4.0000 mg | Freq: Four times a day (QID) | INTRAMUSCULAR | Status: DC | PRN

## 2016-11-03 MED ORDER — LIDOCAINE HCL (PF) 1 % IJ SOLN
INTRAMUSCULAR | Status: AC
  Filled 2016-11-03: qty 30

## 2016-11-03 SURGICAL SUPPLY — 12 items
CATH EXPO 5FR AL2 (CATHETERS) ×2 IMPLANT
CATH INFINITI 5 FR AR2 MOD (CATHETERS) ×2 IMPLANT
CATH INFINITI 5 FR JR5 (CATHETERS) ×2 IMPLANT
CATH INFINITI 5 FR RCB (CATHETERS) ×2 IMPLANT
CATH INFINITI 5FR MULTPACK ANG (CATHETERS) ×2 IMPLANT
KIT HEART LEFT (KITS) ×2 IMPLANT
PACK CARDIAC CATHETERIZATION (CUSTOM PROCEDURE TRAY) ×2 IMPLANT
SHEATH PINNACLE 5F 10CM (SHEATH) ×2 IMPLANT
SYR MEDRAD MARK V 150ML (SYRINGE) ×2 IMPLANT
TRANSDUCER W/STOPCOCK (MISCELLANEOUS) ×2 IMPLANT
TUBING CIL FLEX 10 FLL-RA (TUBING) ×2 IMPLANT
WIRE EMERALD 3MM-J .035X150CM (WIRE) ×2 IMPLANT

## 2016-11-03 NOTE — H&P (View-Only) (Signed)
   The patient is scheduled for cardiac catheterization early this morning.  Daryel November, MD

## 2016-11-03 NOTE — Progress Notes (Signed)
New Admission Note:    Arrival Method: Wheelchair FROM 4E Mental Orientation: Alert and Oriented x 4 Assessment: Completed Skin: Scar Midchest  IV:  R Hand PIV Pain: 0/10 Safety Measures: Siderails up x 4 and call bell in reach Admission: Completed 6E Orientation: Completed Family:  Wife at bedside  Orders have been reviewed and implemented.  Will continue to monitor the patient.

## 2016-11-03 NOTE — Progress Notes (Addendum)
Site area: Right groin a 5 french arterial sheath was removed  Site Prior to Removal:  Level 0  Pressure Applied For 29 MINUTES    Bedrest  Beginning at 0910am  Manual:   Yes.    Patient Status During Pull:  stable  Post Pull Groin Site:  Level 0  Post Pull Instructions Given:  Yes.    Post Pull Pulses Present:  Yes.    Dressing Applied:  Yes.  Pressure dressing applied  Comments:  VS remain stable during sheath pull

## 2016-11-03 NOTE — Interval H&P Note (Signed)
Cath Lab Visit (complete for each Cath Lab visit)  Clinical Evaluation Leading to the Procedure:   ACS: Yes.    Non-ACS:    Anginal Classification: CCS IV  Anti-ischemic medical therapy: Minimal Therapy (1 class of medications)  Non-Invasive Test Results: No non-invasive testing performed  Prior CABG: Previous CABG      History and Physical Interval Note:  11/03/2016 7:37 AM  [name]  has presented today for surgery, with the diagnosis of n stemi  The various methods of treatment have been discussed with the patient and family. After consideration of risks, benefits and other options for treatment, the patient has consented to  Procedure(s): Left Heart Cath and Cors/Grafts Angiography (N/A) as a surgical intervention .  [name] history has been reviewed, patient examined, no change in status, stable for surgery.  I have reviewed the patient's chart and labs.  Questions were answered to the patient's satisfaction.     Shelva Majestic

## 2016-11-03 NOTE — Progress Notes (Signed)
Subjective: Interval History: has no complaint.  Objective: Vital signs in last 24 hours: Temp:  [97.6 F (36.4 C)-99 F (37.2 C)] 97.6 F (36.4 C) (02/20 0935) Pulse Rate:  [0-64] 54 (02/20 0910) Resp:  [0-115] 10 (02/20 0910) BP: (109-172)/(41-79) 158/53 (02/20 0910) SpO2:  [0 %-100 %] 99 % (02/20 0910) Weight:  [61.5 kg (135 lb 9.3 oz)] 61.5 kg (135 lb 9.3 oz) (02/19 1124) Weight change: -0.3 kg (-10.6 oz)  Intake/Output from previous day: 02/19 0701 - 02/20 0700 In: 1242 [P.O.:480; I.V.:317; Blood:335; IV Piggyback:110] Out: 505  Intake/Output this shift: Total I/O In: 29 [I.V.:29] Out: -   General appearance: cooperative, no distress, pale and slowed mentation Resp: clear to auscultation bilaterally Cardio: S1, S2 normal and systolic murmur: systolic ejection 2/6, decrescendo at 2nd left intercostal space GI: soft, non-tender; bowel sounds normal; no masses,  no organomegaly Extremities: AVF LUA , b&t  Lab Results:  Recent Labs  11/02/16 0215 11/03/16 0209  WBC 9.1 8.6  HGB 7.6* 8.0*  HCT 23.7* 23.4*  PLT 161 107*   BMET:  Recent Labs  11/02/16 0215 11/03/16 0209  NA 130* 130*  K 5.1 4.1  CL 94* 95*  CO2 20* 25  GLUCOSE 195* 201*  BUN 101* 68*  CREATININE 6.10* 5.30*  CALCIUM 8.7* 8.3*   No results for input(s): PTH in the last 72 hours. Iron Studies: No results for input(s): IRON, TIBC, TRANSFERRIN, FERRITIN in the last 72 hours.  Studies/Results: No results found.  I have reviewed the patient's current medications.  Assessment/Plan: 1 ESRD HD MWF.  Will do tomorrow, depending on Cards plans. Has outpatient spot at [facility] MWF 2 HTN lower vol 3 Anemia esa,  4 HPTH 5 DM controlled 6 CAD per cards P HD, esa, has outpatient slot    LOS: 3 days   Hareem Surowiec L 11/03/2016,10:13 AM

## 2016-11-03 NOTE — Progress Notes (Signed)
Patient off floor to cath lab.  

## 2016-11-03 NOTE — Progress Notes (Signed)
Accepted back at [place] Mon,Wed and Fri 2nd shift

## 2016-11-03 NOTE — Progress Notes (Signed)
PROGRESS NOTE    Kenneth [name]  PPJ:[id number] DOB: 04/06/1956 DOA: 10/30/2016 PCP: [name], MD     Brief Narrative:  Kenneth [name] is a 61 y.o.malewith history of DM2, CKD stage IV requiring HD for a period of time, CAD s/p CABG, and chronic systolic CHF who presented with worsening somnolence and confusion with progressive leg edema and abdom distention for a few weeks. He did require dialysis for a short period of time late last year. He had stopped dialysis and was treated with Lasix. However, over the past few weeks, his peripheral edema continued to get worse. He had planned to resume dialysis as an outpatient on 11/02/2016 as increasing doses of Lasix failed to improve symptoms. However, patient was ultimately admitted due to end-stage renal disease. Nephrology was consulted and patient did undergo dialysis during admission. Overnight on 2/19, patient's troponin was elevated at 4.2. He did have some chest pain earlier that night as well. EKG was obtained which showed ST depressions. Cardiology was consulted and underwent heart cath on 2/20.   Assessment & Plan:   Active Problems:   Acute pulmonary edema (HCC)   CHF (congestive heart failure) (HCC)   ESRD (end stage renal disease) (HCC)   Acute encephalopathy   Uremia   CAD (coronary artery disease)   DM (diabetes mellitus), type 2 with renal complications (HCC)   Elevated troponin   Pressure injury of skin   ESRD -Nephrology is following -HD planned for tmrw 2/21   NSTEMI  -History of coronary artery disease as well as previous and NSTEMI in September 2017, status post CABG 3 -Troponin peak at 4.2 and trended down  -S/P heart cath 2/20 -Continue aspirin, plavix, lipitor, zetia  -Cardiology is following  Paroxysmal atrial fibrillation -CHA2DS2VASc 3  -Cardiology does not feel that patient needs long-term anticoagulation  Chronic diastolic heart failure with ischemic cardiomyopathy -Echo 09/2016 with EF 60-65% and  grade 2 diastolic dysfunction -Hold home Lasix due to low BP  -Hold home Coreg due to bradycardia  -No ACE due to ESRD   Elevated TSH -Free T4 is normal. Recommend repeat TSH in 6-8 weeks as outpatient once acute illness resolves   Anemia of chronic kidney disease -EPO per nephro. S/p 1u pRBC   DM type 2 -Ha1c 7.7 -SSI   DVT prophylaxis: subq hep Code Status: Full code  Family Communication: wife at bedside  Disposition Plan: pending further stabilization and plan from cardiology/nephrology    Consultants:   Nephrology  Cardiology   Procedures:   Heart cath 2/20  Antimicrobials:  Anti-infectives    None      Subjective: I spoke with wife and patient in [place] during examination. No translator was called.  Patient underwent heart cath this morning. Report is not yet available, but patient and wife were told that he will be treated medically at this time. He currently has no complaints. He is eating lunch, sitting in chair.   Objective: Vitals:   11/03/16 0905 11/03/16 0910 11/03/16 0935 11/03/16 1209  BP: (!) 149/51 (!) 158/53 138/62 131/67  Pulse: (!) 53 (!) 54 (!) 54 (!) 59  Resp: (!) 9 10 11 11   Temp:   97.6 F (36.4 C) 98.1 F (36.7 C)  TempSrc:   Oral Oral  SpO2: 98% 99% 96% 95%  Weight:      Height:        Intake/Output Summary (Last 24 hours) at 11/03/16 1406 Last data filed at 11/03/16 1300  Gross per 24 hour  Intake              775 ml  Output                0 ml  Net              775 ml   Filed Weights   11/02/16 0441 11/02/16 0755 11/02/16 1124  Weight: 62.3 kg (137 lb 5.6 oz) 62 kg (136 lb 11 oz) 61.5 kg (135 lb 9.3 oz)    Examination:  General exam: Appears calm and comfortable  Respiratory system: Clear to auscultation. Respiratory effort normal. Cardiovascular system: S1 & S2 heard, RRR. No JVD, murmurs, rubs, gallops or clicks. No pedal edema. Gastrointestinal system: Abdomen is nondistended, soft and nontender. No organomegaly  or masses felt. Normal bowel sounds heard. Central nervous system: Alert and oriented. No focal neurological deficits. Extremities: Symmetric 5 x 5 power. Skin: No rashes, lesions or ulcers Psychiatry: Judgement and insight appear normal. Mood & affect appropriate.   Data Reviewed: I have personally reviewed following labs and imaging studies  CBC:  Recent Labs Lab 10/30/16 2024  10/31/16 0846 11/01/16 0226 11/02/16 0215 11/03/16 0209 11/03/16 1142  WBC 9.3  < > 9.9 3.4* 9.1 8.6 7.6  NEUTROABS 6.2  --   --   --   --   --   --   HGB 8.7*  < > 7.9* 7.2* 7.6* 8.0* 7.9*  HCT 26.4*  < > 24.0* 21.4* 23.7* 23.4* 22.9*  MCV 89.8  < > 90.6 89.2 90.5 88.6 89.8  PLT 163  < > 135* 138* 161 107* 97*  < > = values in this interval not displayed. Basic Metabolic Panel:  Recent Labs Lab 10/31/16 0332 10/31/16 0846 11/01/16 1715 11/02/16 0215 11/03/16 0209 11/03/16 1142  NA 135 136 130* 130* 130* 130*  K 4.7 4.4 4.7 5.1 4.1 4.4  CL 99* 99* 94* 94* 95* 96*  CO2 22 19* 22 20* 25 23  GLUCOSE 198* 150* 229* 195* 201* 173*  BUN 128* 129* 96* 101* 68* 73*  CREATININE 6.19*  6.08* 6.30* 5.53* 6.10* 5.30* 5.65*  5.60*  CALCIUM 9.0 9.1 8.4* 8.7* 8.3* 8.5*  MG  --  2.5* 2.4  --   --   --   PHOS 5.7* 5.6*  --   --   --  5.9*   GFR: Estimated Creatinine Clearance: 12.2 mL/min (by C-G formula based on SCr of 5.65 mg/dL (H)). Liver Function Tests:  Recent Labs Lab 10/30/16 2024 10/31/16 0332 10/31/16 0846 11/02/16 0215 11/03/16 0209 11/03/16 1142  AST 78*  --  42* 129* 404*  --   ALT 87*  --  69* 122* 497*  --   ALKPHOS 104  --  105 143* 192*  --   BILITOT 0.7  --  1.1 0.9 0.7  --   PROT 7.0  --  6.6 6.4* 5.6*  --   ALBUMIN 3.4* 3.1* 3.2* 3.0* 2.7* 2.7*   No results for input(s): LIPASE, AMYLASE in the last 168 hours.  Recent Labs Lab 10/30/16 2059  AMMONIA 14   Coagulation Profile:  Recent Labs Lab 11/03/16 0209  INR 1.24   Cardiac Enzymes:  Recent Labs Lab  10/31/16 1436 11/02/16 0215 11/02/16 0525 11/02/16 1336 11/02/16 1601  CKTOTAL  --   --  138  --   --   CKMB  --   --  12.2*  --   --  TROPONINI 0.08* 4.20* 3.83* 3.61* 3.22*   BNP (last 3 results) No results for input(s): PROBNP in the last 8760 hours. HbA1C: No results for input(s): HGBA1C in the last 72 hours. CBG:  Recent Labs Lab 11/02/16 1248 11/02/16 1550 11/02/16 2103 11/03/16 0848 11/03/16 1212  GLUCAP 145* 199* 280* 152* 183*   Lipid Profile: No results for input(s): CHOL, HDL, LDLCALC, TRIG, CHOLHDL, LDLDIRECT in the last 72 hours. Thyroid Function Tests:  Recent Labs  11/01/16 0226  FREET4 1.01   Anemia Panel: No results for input(s): VITAMINB12, FOLATE, FERRITIN, TIBC, IRON, RETICCTPCT in the last 72 hours. Sepsis Labs:  Recent Labs Lab 10/30/16 2049 10/30/16 2322  LATICACIDVEN 1.01 0.84    Recent Results (from the past 240 hour(s))  Blood Culture (routine x 2)     Status: None (Preliminary result)   Collection Time: 10/30/16  8:44 PM  Result Value Ref Range Status   Specimen Description BLOOD RIGHT ANTECUBITAL  Final   Special Requests BOTTLES DRAWN AEROBIC AND ANAEROBIC 5CC  Final   Culture NO GROWTH 4 DAYS  Final   Report Status PENDING  Incomplete  Blood Culture (routine x 2)     Status: None (Preliminary result)   Collection Time: 10/30/16  9:03 PM  Result Value Ref Range Status   Specimen Description BLOOD RIGHT HAND  Final   Special Requests BOTTLES DRAWN AEROBIC AND ANAEROBIC 5CC  Final   Culture NO GROWTH 4 DAYS  Final   Report Status PENDING  Incomplete  MRSA PCR Screening     Status: None   Collection Time: 10/31/16  3:26 AM  Result Value Ref Range Status   MRSA by PCR NEGATIVE NEGATIVE Final    Comment:        The GeneXpert MRSA Assay (FDA approved for NASAL specimens only), is one component of a comprehensive MRSA colonization surveillance program. It is not intended to diagnose MRSA infection nor to guide or monitor  treatment for MRSA infections.        Radiology Studies: No results found.    Scheduled Meds: . [START ON 11/04/2016] aspirin  81 mg Oral Daily  . atorvastatin  80 mg Oral q1800  . citalopram  20 mg Oral Daily  . [START ON 11/04/2016] clopidogrel  75 mg Oral Daily  . [START ON 11/07/2016] darbepoetin (ARANESP) injection - DIALYSIS  200 mcg Intravenous Q Sat-HD  . ezetimibe  10 mg Oral Daily  . ferric gluconate (FERRLECIT/NULECIT) IV  125 mg Intravenous Q M,W,F-HD  . [START ON 11/04/2016] heparin  5,000 Units Subcutaneous Q8H  . insulin aspart  0-5 Units Subcutaneous QHS  . insulin aspart  0-9 Units Subcutaneous TID WC  . multivitamin  1 tablet Oral QHS  . sodium chloride flush  3 mL Intravenous Q12H   Continuous Infusions:   LOS: 3 days    Time spent: 40 minutes   Dessa Phi, DO [facility] [email or web address] Password [email or web address] 11/03/2016, 2:06 PM

## 2016-11-03 NOTE — Progress Notes (Signed)
   The patient is scheduled for cardiac catheterization early this morning.  Daryel November, MD

## 2016-11-03 NOTE — Progress Notes (Signed)
PT Cancellation Note  Patient Details Name: Kenneth [name] MRN: [id number] DOB: November 23, 1955   Cancelled Treatment:    Reason Eval/Treat Not Completed: Patient at procedure or test/unavailable (cardiac cath)   Duncan Dull 11/03/2016, 8:17 AM Alben Deeds, PT DPT  [phone]

## 2016-11-03 NOTE — Progress Notes (Signed)
OT Cancellation Note  Patient Details Name: Kenneth [name] MRN: [id number] DOB: 13-Feb-1956   Cancelled Treatment:    Reason Eval/Treat Not Completed: Patient at procedure or test/ unavailable  [name] 11/03/2016, 8:20 AM

## 2016-11-04 ENCOUNTER — Encounter (HOSPITAL_COMMUNITY): Payer: Self-pay | Admitting: Cardiovascular Disease

## 2016-11-04 DIAGNOSIS — J810 Acute pulmonary edema: Secondary | ICD-10-CM

## 2016-11-04 DIAGNOSIS — I5043 Acute on chronic combined systolic (congestive) and diastolic (congestive) heart failure: Secondary | ICD-10-CM

## 2016-11-04 DIAGNOSIS — G9340 Encephalopathy, unspecified: Secondary | ICD-10-CM

## 2016-11-04 LAB — GLUCOSE, CAPILLARY
GLUCOSE-CAPILLARY: 207 mg/dL — AB (ref 65–99)
GLUCOSE-CAPILLARY: 242 mg/dL — AB (ref 65–99)

## 2016-11-04 LAB — HEPATIC FUNCTION PANEL
ALBUMIN: 2.9 g/dL — AB (ref 3.5–5.0)
ALT: 336 U/L — ABNORMAL HIGH (ref 17–63)
AST: 150 U/L — AB (ref 15–41)
Alkaline Phosphatase: 182 U/L — ABNORMAL HIGH (ref 38–126)
Bilirubin, Direct: 0.3 mg/dL (ref 0.1–0.5)
Indirect Bilirubin: 1.1 mg/dL — ABNORMAL HIGH (ref 0.3–0.9)
TOTAL PROTEIN: 6.5 g/dL (ref 6.5–8.1)
Total Bilirubin: 1.4 mg/dL — ABNORMAL HIGH (ref 0.3–1.2)

## 2016-11-04 LAB — CBC
HCT: 23.6 % — ABNORMAL LOW (ref 39.0–52.0)
HEMOGLOBIN: 8 g/dL — AB (ref 13.0–17.0)
MCH: 30.5 pg (ref 26.0–34.0)
MCHC: 33.9 g/dL (ref 30.0–36.0)
MCV: 90.1 fL (ref 78.0–100.0)
Platelets: 103 10*3/uL — ABNORMAL LOW (ref 150–400)
RBC: 2.62 MIL/uL — ABNORMAL LOW (ref 4.22–5.81)
RDW: 13.8 % (ref 11.5–15.5)
WBC: 9.7 10*3/uL (ref 4.0–10.5)

## 2016-11-04 LAB — BASIC METABOLIC PANEL
ANION GAP: 14 (ref 5–15)
BUN: 78 mg/dL — ABNORMAL HIGH (ref 6–20)
CALCIUM: 8.3 mg/dL — AB (ref 8.9–10.3)
CO2: 22 mmol/L (ref 22–32)
CREATININE: 6.73 mg/dL — AB (ref 0.61–1.24)
Chloride: 91 mmol/L — ABNORMAL LOW (ref 101–111)
GFR, EST AFRICAN AMERICAN: 9 mL/min — AB (ref >60)
GFR, EST NON AFRICAN AMERICAN: 8 mL/min — AB (ref >60)
Glucose, Bld: 172 mg/dL — ABNORMAL HIGH (ref 65–99)
Potassium: 3.9 mmol/L (ref 3.5–5.1)
Sodium: 127 mmol/L — ABNORMAL LOW (ref 135–145)

## 2016-11-04 LAB — CULTURE, BLOOD (ROUTINE X 2)
CULTURE: NO GROWTH
Culture: NO GROWTH

## 2016-11-04 MED ORDER — PRO-STAT SUGAR FREE PO LIQD
30.0000 mL | Freq: Two times a day (BID) | ORAL | Status: DC
  Administered 2016-11-04 – 2016-11-05 (×2): 30 mL via ORAL
  Filled 2016-11-04 (×2): qty 30

## 2016-11-04 MED ORDER — METOPROLOL SUCCINATE ER 25 MG PO TB24
12.5000 mg | ORAL_TABLET | Freq: Every day | ORAL | Status: DC
  Administered 2016-11-04: 12.5 mg via ORAL
  Filled 2016-11-04: qty 1

## 2016-11-04 NOTE — Care Management Important Message (Signed)
Important Message  Patient Details  Name: Kenneth [name] MRN: [id number] Date of Birth: [birth year]   Medicare Important Message Given:  Yes    [name] 11/04/2016, 11:31 AM

## 2016-11-04 NOTE — Progress Notes (Signed)
OT Cancellation    11/04/16 0900  OT Visit Information  Last OT Received On 11/04/16  Reason Eval/Treat Not Completed Patient at procedure or test/ unavailable    [facility], OTR/L [phone]

## 2016-11-04 NOTE — Progress Notes (Signed)
PROGRESS NOTE  Kenneth [name] UJW:[id number] DOB: 09/10/1956 DOA: 10/30/2016 PCP: [name], MD   LOS: 4 days   Brief Narrative: Kenneth [name] is a 61 y.o.malewith history of DM2, CKD stage IV requiring HD for a period of time, CAD s/p CABG, and chronic systolic CHF who presented with worsening somnolence and confusion with progressive leg edema and abdom distention for a few weeks. He did require dialysis for a short period of time late last year. He had stopped dialysis and was treated with Lasix. However, over the past few weeks, his peripheral edema continued to get worse. He had planned to resume dialysis as an outpatient on 11/02/2016 as increasing doses of Lasix failed to improve symptoms. However, patient was ultimately admitted due to end-stage renal disease. Nephrology was consulted and patient did undergo dialysis during admission. Overnight on 2/19, patient's troponin was elevated at 4.2. He did have some chest pain earlier that night as well. EKG was obtained which showed ST depressions. Cardiology was consulted and underwent heart cath on 2/20.   Assessment & Plan: Active Problems:   Acute pulmonary edema (HCC)   CHF (congestive heart failure) (HCC)   ESRD (end stage renal disease) (HCC)   Acute encephalopathy   Uremia   CAD (coronary artery disease)   DM (diabetes mellitus), type 2 with renal complications (HCC)   Elevated troponin   Pressure injury of skin   ESRD -Nephrology is following -HD today   Elevated LFTs -unclear etiology. Recheck today. If still high will need hepatitis r/o, RUQ [place]  NSTEMI  -History of coronary artery disease as well as previous and NSTEMI in September 2017, status post CABG 3 -Troponin peak at 4.2 and trended down  -S/P heart cath 2/20, plan for medical management -Continue aspirin, plavix, lipitor, zetia  -Cardiology is following  Paroxysmal atrial fibrillation -CHA2DS2VASc 3, this was a one-time episode in 2017 following his  CABG -Cardiology does not feel that patient needs long-term anticoagulation  Chronic diastolic heart failure with ischemic cardiomyopathy -Echo 09/2016 with EF 60-65% and grade 2 diastolic dysfunction -Hold home Lasix due to low BP  -Hold home Coreg due to bradycardia  -No ACE due to ESRD   Elevated TSH -Free T4 is normal. Recommend repeat TSH in 6-8 weeks as outpatient once acute illness resolves   Anemia of chronic kidney disease -EPO per nephro. S/p 1u pRBC   DM type 2 -Ha1c 7.7 -SSI   DVT prophylaxis: Heparin Code Status: Full code Family Communication: No family at bedside Disposition Plan: Home 1-2 days  Consultants:   Cardiology  Nephrology  Procedures:   Cardiac cath 2/20  Antimicrobials:  None    Subjective: - no chest pain, shortness of breath, no abdominal pain, nausea or vomiting.   Objective: Vitals:   11/04/16 0854 11/04/16 0914 11/04/16 0930 11/04/16 1000  BP: (!) 153/72 (!) 161/71 (!) 145/67 (!) 123/54  Pulse: 70 62 61 61  Resp: 16     Temp: 98 F (36.7 C)     TempSrc:      SpO2:      Weight: 62.9 kg (138 lb 10.7 oz)     Height:        Intake/Output Summary (Last 24 hours) at 11/04/16 1017 Last data filed at 11/04/16 0600  Gross per 24 hour  Intake              240 ml  Output  0 ml  Net              240 ml   Filed Weights   11/02/16 1124 11/03/16 2010 11/04/16 0854  Weight: 61.5 kg (135 lb 9.3 oz) 62.6 kg (138 lb) 62.9 kg (138 lb 10.7 oz)    Examination: Constitutional: NAD Vitals:   11/04/16 0854 11/04/16 0914 11/04/16 0930 11/04/16 1000  BP: (!) 153/72 (!) 161/71 (!) 145/67 (!) 123/54  Pulse: 70 62 61 61  Resp: 16     Temp: 98 F (36.7 C)     TempSrc:      SpO2:      Weight: 62.9 kg (138 lb 10.7 oz)     Height:       Eyes: PERRL, lids and conjunctivae normal, no scleral icterus Respiratory: clear to auscultation bilaterally, no wheezing, no crackles. Normal respiratory effort. Cardiovascular: Regular  rate and rhythm, no murmurs / rubs / gallops. No LE edema.  Abdomen: no tenderness. Bowel sounds positive.  Musculoskeletal: no clubbing / cyanosis.  Skin: no rashes, lesions, ulcers. No induration Neurologic: non focal    Data Reviewed: I have personally reviewed following labs and imaging studies  CBC:  Recent Labs Lab 10/30/16 2024  11/01/16 0226 11/02/16 0215 11/03/16 0209 11/03/16 1142 11/04/16 0606  WBC 9.3  < > 3.4* 9.1 8.6 7.6 9.7  NEUTROABS 6.2  --   --   --   --   --   --   HGB 8.7*  < > 7.2* 7.6* 8.0* 7.9* 8.0*  HCT 26.4*  < > 21.4* 23.7* 23.4* 22.9* 23.6*  MCV 89.8  < > 89.2 90.5 88.6 89.8 90.1  PLT 163  < > 138* 161 107* 97* 103*  < > = values in this interval not displayed. Basic Metabolic Panel:  Recent Labs Lab 10/31/16 0332 10/31/16 0846 11/01/16 1715 11/02/16 0215 11/03/16 0209 11/03/16 1142 11/04/16 0606  NA 135 136 130* 130* 130* 130* 127*  K 4.7 4.4 4.7 5.1 4.1 4.4 3.9  CL 99* 99* 94* 94* 95* 96* 91*  CO2 22 19* 22 20* 25 23 22   GLUCOSE 198* 150* 229* 195* 201* 173* 172*  BUN 128* 129* 96* 101* 68* 73* 78*  CREATININE 6.19*  6.08* 6.30* 5.53* 6.10* 5.30* 5.65*  5.60* 6.73*  CALCIUM 9.0 9.1 8.4* 8.7* 8.3* 8.5* 8.3*  MG  --  2.5* 2.4  --   --   --   --   PHOS 5.7* 5.6*  --   --   --  5.9*  --    GFR: Estimated Creatinine Clearance: 10.5 mL/min (by C-G formula based on SCr of 6.73 mg/dL (H)). Liver Function Tests:  Recent Labs Lab 10/30/16 2024 10/31/16 0332 10/31/16 0846 11/02/16 0215 11/03/16 0209 11/03/16 1142  AST 78*  --  42* 129* 404*  --   ALT 87*  --  69* 122* 497*  --   ALKPHOS 104  --  105 143* 192*  --   BILITOT 0.7  --  1.1 0.9 0.7  --   PROT 7.0  --  6.6 6.4* 5.6*  --   ALBUMIN 3.4* 3.1* 3.2* 3.0* 2.7* 2.7*   No results for input(s): LIPASE, AMYLASE in the last 168 hours.  Recent Labs Lab 10/30/16 2059  AMMONIA 14   Coagulation Profile:  Recent Labs Lab 11/03/16 0209  INR 1.24   Cardiac  Enzymes:  Recent Labs Lab 10/31/16 1436 11/02/16 0215 11/02/16 0525 11/02/16 1336 11/02/16 1601  CKTOTAL  --   --  138  --   --   CKMB  --   --  12.2*  --   --   TROPONINI 0.08* 4.20* 3.83* 3.61* 3.22*   BNP (last 3 results) No results for input(s): PROBNP in the last 8760 hours. HbA1C: No results for input(s): HGBA1C in the last 72 hours. CBG:  Recent Labs Lab 11/02/16 2103 11/03/16 0848 11/03/16 1212 11/03/16 1613 11/03/16 2147  GLUCAP 280* 152* 183* 298* 223*   Lipid Profile: No results for input(s): CHOL, HDL, LDLCALC, TRIG, CHOLHDL, LDLDIRECT in the last 72 hours. Thyroid Function Tests: No results for input(s): TSH, T4TOTAL, FREET4, T3FREE, THYROIDAB in the last 72 hours. Anemia Panel: No results for input(s): VITAMINB12, FOLATE, FERRITIN, TIBC, IRON, RETICCTPCT in the last 72 hours. Urine analysis:    Component Value Date/Time   COLORURINE YELLOW 10/30/2016 2339   APPEARANCEUR CLEAR 10/30/2016 2339   LABSPEC 1.010 10/30/2016 2339   PHURINE 5.0 10/30/2016 2339   GLUCOSEU NEGATIVE 10/30/2016 2339   HGBUR NEGATIVE 10/30/2016 2339   BILIRUBINUR NEGATIVE 10/30/2016 2339   KETONESUR NEGATIVE 10/30/2016 2339   PROTEINUR 100 (A) 10/30/2016 2339   NITRITE NEGATIVE 10/30/2016 2339   LEUKOCYTESUR NEGATIVE 10/30/2016 2339   Sepsis Labs: Invalid input(s): PROCALCITONIN, LACTICIDVEN  Recent Results (from the past 240 hour(s))  Blood Culture (routine x 2)     Status: None (Preliminary result)   Collection Time: 10/30/16  8:44 PM  Result Value Ref Range Status   Specimen Description BLOOD RIGHT ANTECUBITAL  Final   Special Requests BOTTLES DRAWN AEROBIC AND ANAEROBIC 5CC  Final   Culture NO GROWTH 4 DAYS  Final   Report Status PENDING  Incomplete  Blood Culture (routine x 2)     Status: None (Preliminary result)   Collection Time: 10/30/16  9:03 PM  Result Value Ref Range Status   Specimen Description BLOOD RIGHT HAND  Final   Special Requests BOTTLES DRAWN  AEROBIC AND ANAEROBIC 5CC  Final   Culture NO GROWTH 4 DAYS  Final   Report Status PENDING  Incomplete  MRSA PCR Screening     Status: None   Collection Time: 10/31/16  3:26 AM  Result Value Ref Range Status   MRSA by PCR NEGATIVE NEGATIVE Final    Comment:        The GeneXpert MRSA Assay (FDA approved for NASAL specimens only), is one component of a comprehensive MRSA colonization surveillance program. It is not intended to diagnose MRSA infection nor to guide or monitor treatment for MRSA infections.       Radiology Studies: No results found.   Scheduled Meds: . aspirin  81 mg Oral Daily  . atorvastatin  80 mg Oral q1800  . citalopram  20 mg Oral Daily  . clopidogrel  75 mg Oral Daily  . [START ON 11/07/2016] darbepoetin (ARANESP) injection - DIALYSIS  200 mcg Intravenous Q Sat-HD  . ezetimibe  10 mg Oral Daily  . ferric gluconate (FERRLECIT/NULECIT) IV  125 mg Intravenous Q M,W,F-HD  . heparin  5,000 Units Subcutaneous Q8H  . insulin aspart  0-5 Units Subcutaneous QHS  . insulin aspart  0-9 Units Subcutaneous TID WC  . metoprolol succinate  12.5 mg Oral QHS  . multivitamin  1 tablet Oral QHS  . sodium chloride flush  3 mL Intravenous Q12H   Continuous Infusions:   Marzetta Board, MD, PhD [facility] Pager [phone]  If 7PM-7AM, please contact night-coverage [email or web address] Password [email or web address]  11/04/2016, 10:17 AM

## 2016-11-04 NOTE — Progress Notes (Signed)
Patient arrived to unit per bed.  Reviewed treatment plan and this RN agrees.  Report received from bedside RN, Lambert Keto.  Consent verified.  Patient A & O X 4. Lung sounds diminished to ausculation in all fields. No edema. Cardiac: NSR, BBB.  Prepped LUAVF with alcohol and cannulated with two 17 gauge needles.  Pulsation of blood noted.  Flushed access well with saline per protocol.  Connected and secured lines and initiated tx at 0914.  UF goal of 3500 mL and net fluid removal of 3000 mL.  Will continue to monitor.

## 2016-11-04 NOTE — Progress Notes (Signed)
Progress Note  Patient Name: Kenneth [name] Date of Encounter: 11/04/2016  Primary Cardiologist: Dr. Aundra Dubin   Subjective   Denies CP. No dyspnea. Right groin is a bit sore but no severe pain. Ambulating w/o difficulty.   Inpatient Medications    Scheduled Meds: . aspirin  81 mg Oral Daily  . atorvastatin  80 mg Oral q1800  . citalopram  20 mg Oral Daily  . clopidogrel  75 mg Oral Daily  . [START ON 11/07/2016] darbepoetin (ARANESP) injection - DIALYSIS  200 mcg Intravenous Q Sat-HD  . ezetimibe  10 mg Oral Daily  . ferric gluconate (FERRLECIT/NULECIT) IV  125 mg Intravenous Q M,W,F-HD  . heparin  5,000 Units Subcutaneous Q8H  . insulin aspart  0-5 Units Subcutaneous QHS  . insulin aspart  0-9 Units Subcutaneous TID WC  . multivitamin  1 tablet Oral QHS  . sodium chloride flush  3 mL Intravenous Q12H   Continuous Infusions:  PRN Meds: sodium chloride, sodium chloride, sodium chloride, sodium chloride, sodium chloride, acetaminophen, alteplase, heparin, heparin, lidocaine (PF), lidocaine-prilocaine, ondansetron (ZOFRAN) IV, ondansetron **OR** [DISCONTINUED] ondansetron (ZOFRAN) IV, pentafluoroprop-tetrafluoroeth, sodium chloride flush   Vital Signs    Vitals:   11/03/16 1209 11/03/16 1614 11/03/16 2010 11/04/16 0448  BP: 131/67 (!) 155/67 (!) 129/58 (!) 159/59  Pulse: (!) 59 62 (!) 57 62  Resp: 11 18 17 16   Temp: 98.1 F (36.7 C) 98.2 F (36.8 C) 98.2 F (36.8 C) 97.7 F (36.5 C)  TempSrc: Oral Oral    SpO2: 95% 99% 97% 94%  Weight:   138 lb (62.6 kg)   Height:        Intake/Output Summary (Last 24 hours) at 11/04/16 0753 Last data filed at 11/04/16 0600  Gross per 24 hour  Intake              269 ml  Output                0 ml  Net              269 ml   Filed Weights   11/02/16 0755 11/02/16 1124 11/03/16 2010  Weight: 136 lb 11 oz (62 kg) 135 lb 9.3 oz (61.5 kg) 138 lb (62.6 kg)    Telemetry    NSR - Personally Reviewed   Physical Exam   GEN: No  acute distress.   Neck: No JVD Cardiac: RRR, no murmurs, rubs, or gallops.  Respiratory: Clear to auscultation bilaterally. GI: Soft, nontender, non-distended  MS: No edema; No deformity, small right groin hematoma post cath, nontender Neuro:  Nonfocal  Psych: Normal affect   Labs    Chemistry Recent Labs Lab 10/31/16 0846  11/02/16 0215 11/03/16 0209 11/03/16 1142 11/04/16 0606  NA 136  < > 130* 130* 130* 127*  K 4.4  < > 5.1 4.1 4.4 3.9  CL 99*  < > 94* 95* 96* 91*  CO2 19*  < > 20* 25 23 22   GLUCOSE 150*  < > 195* 201* 173* 172*  BUN 129*  < > 101* 68* 73* 78*  CREATININE 6.30*  < > 6.10* 5.30* 5.65*  5.60* 6.73*  CALCIUM 9.1  < > 8.7* 8.3* 8.5* 8.3*  PROT 6.6  --  6.4* 5.6*  --   --   ALBUMIN 3.2*  --  3.0* 2.7* 2.7*  --   AST 42*  --  129* 404*  --   --   ALT 69*  --  122* 497*  --   --   ALKPHOS 105  --  143* 192*  --   --   BILITOT 1.1  --  0.9 0.7  --   --   GFRNONAA 9*  < > 9* 11* 10*  10* 8*  GFRAA 10*  < > 10* 12* 11*  12* 9*  ANIONGAP 18*  < > 16* 10 11 14   < > = values in this interval not displayed.   Hematology Recent Labs Lab 11/03/16 0209 11/03/16 1142 11/04/16 0606  WBC 8.6 7.6 9.7  RBC 2.64* 2.55* 2.62*  HGB 8.0* 7.9* 8.0*  HCT 23.4* 22.9* 23.6*  MCV 88.6 89.8 90.1  MCH 30.3 31.0 30.5  MCHC 34.2 34.5 33.9  RDW 14.1 14.0 13.8  PLT 107* 97* 103*    Cardiac Enzymes Recent Labs Lab 11/02/16 0215 11/02/16 0525 11/02/16 1336 11/02/16 1601  TROPONINI 4.20* 3.83* 3.61* 3.22*   No results for input(s): TROPIPOC in the last 168 hours.   BNP Recent Labs Lab 10/30/16 2024  BNP 1,900.0*     DDimer No results for input(s): DDIMER in the last 168 hours.   Radiology    No results found.  Cardiac Studies   Procedures   Left Heart Cath and Cors/Grafts Angiography  Conclusion     Mid LAD to Dist LAD lesion, 90 %stenosed.  Ost 2nd Diag to 2nd Diag lesion, 95 %stenosed.  Ramus lesion, 80 %stenosed.  Prox Cx lesion, 90  %stenosed.  Ost 1st Mrg to 1st Mrg lesion, 100 %stenosed.  Prox RCA lesion, 95 %stenosed.  Mid RCA lesion, 100 %stenosed.  SVG and is normal in caliber and anatomically normal.  LIMA and is normal in caliber and anatomically normal.  Dist LAD lesion, 20 %stenosed.  Graft was visualized by angiography and is normal in caliber and anatomically normal.  Origin to Prox Graft lesion, 100 %stenosed.  Ost LM to LM lesion, 40 %stenosed.   Normal LV function with an ejection fraction of 50% with mild mid anterolateral and mid inferior hypocontractility.  Significant native CAD with 30% smooth distal left main stenosis; diffuse 90% proximal to mid LAD stenoses with 95% diffuse stenosis in the diagonal vessel, which was not bypassed; diffuse 80% stenosis in a small intermediate vessel; 90% proximal left circumflex stenosis, without visualization antegrade of the OM1 vessel; and total proximal RCA occlusion.  Patent LIMA graft supplying the mid LAD with some views suggesting a mild 20% eccentric narrowing just beyond the anastomosis.  There is collateralization from the distal LAD to the distal RCA and faint collateralization to the distal diagonal vessel.  Patent SVG supplying the large OM branch with extensive collateralization supplying the entire RCA up to the point of proximal RCA occlusion.  Occluded SVG which had supplied the RCA.     Patient Profile     Patient is a 61 year old male with a past medical history coronary artery disease, NSTEMI in September 2017 status post CABG, paroxysmal A. Fib (only noted post-op CABG), chronic systolic CHF, diabetes type 2, end-stage renal disease on hemodialysis admitted to the hospital on 10/30/2016 for acute encephalopathy 2/2 uremia and acute pulmonary edema. Cardiology has been consulted for elevated troponin, peaking at 4.2, and patient experiencing chest pain.  Assessment & Plan    1. CAD/NSTEMI: s/p recent CABG x 3 (LIMA-LAD, SVG-OM,  SVG-RCA) in Sep. 2017.  LHC this admit on 11/03/16 showed patent LIMA-LAD, occluded SVG-RCA, however the SVG-OM is patent with extensive collateralization supplying the  entire RCA up to the point of proximal RCA occlusion. LVEF normal by cath estimate at 50%. No PCI indicated. Plan is for medical management. He denies any CP today. Continue ASA + Plavix along with high intensity statin therapy with Lipitor + Zetia. It was outlined in Dr. Antionette Char consult note, 2/19, that his BP will not allow for aggressive antianginal therapy with nitrates and beta blockers. His BP appears to be on the higher side today. ? If issues with hypotension during HD. We can monitor BP and attempt trial of low dose antianginals if stable pressures.   2. ESRD: HD per nephrology.   3. PAF: afib noted post CABG in 05/2016. No reported recurrence. NSR on telemetry.   4. DM: per primary team.  5. Acute Encephalopathy 2/2 Uremia: resolved. Management per primary  6. Right Groin Hematoma: small groin hematoma noted post cath. No significant pain. No significant change in H/H. He is ambulating w/o difficulty.    Signed, Lyda Jester, PA-C  11/04/2016, 7:53 AM   Patient seen and examined. I agree with the assessment and plan as detailed above. See also my additional thoughts below.   [name] cardiac status now is stable. Catheterization was done yesterday. Medical therapy is recommended on an ongoing basis. The patient can be discharged home from the cardiac viewpoint.  Dola Argyle, MD, [facility] 11/04/2016 9:34 AM

## 2016-11-04 NOTE — Progress Notes (Signed)
Physical Therapy Treatment Patient Details Name: Kenneth [name] MRN: [id number] DOB: 04/18/1956 Today's Date: 11/04/2016    History of Present Illness Kenneth [name] [facility] a 61 y.o.malewith medical history significant of type II diabetes, CKD stage IV requiring HD for a period of time, CAD, and chronic systolic CHF NSEMI September 2017. Pt admitted for worsening somnolence and confusion with progressive leg weakness.    PT Comments    Pt improving overall, but very retropulsive and unsteady with gait today, likely due to long hours in HD today.  Will reassess tomorrow.   Follow Up Recommendations  Home health PT;Supervision/Assistance - 24 hour     Equipment Recommendations  Rolling walker with 5" wheels    Recommendations for Other Services       Precautions / Restrictions Precautions Precautions: Fall Restrictions Weight Bearing Restrictions: No    Mobility  Bed Mobility Overal bed mobility: Modified Independent             General bed mobility comments: pt impulsive sprang to EOB using abdominal muscles into long sit position  Transfers   Equipment used: None Transfers: Sit to/from Stand Sit to Stand: Min assist;Min guard         General transfer comment: powered up okay and then needed steady assist  Ambulation/Gait Ambulation/Gait assistance: Mod assist;Min assist Ambulation Distance (Feet): 140 Feet Assistive device: None;1 person hand held assist Gait Pattern/deviations: Step-through pattern;Scissoring Gait velocity: slow Gait velocity interpretation: Below normal speed for age/gender General Gait Details: retropulsive, scissoring with less assist given.  Heavy posterior lean   Stairs            Wheelchair Mobility    Modified Rankin (Stroke Patients Only)       Balance Overall balance assessment: Needs assistance Sitting-balance support: Feet supported;Single extremity supported;No upper extremity supported Sitting balance-Leahy Scale:  Fair     Standing balance support: Bilateral upper extremity supported;Single extremity supported Standing balance-Leahy Scale: Poor Standing balance comment: requires physical assist                    Cognition Arousal/Alertness: Awake/alert;Lethargic Behavior During Therapy: Flat affect;WFL for tasks assessed/performed Overall Cognitive Status: History of cognitive impairments - at baseline Area of Impairment: Safety/judgement;Problem solving;Following commands       Following Commands: Follows one step commands consistently Safety/Judgement: Decreased awareness of deficits   Problem Solving: Slow processing      Exercises      General Comments General comments (skin integrity, edema, etc.): Going to bathroom, getting in shower, standing at the sink, pt needed min to mod assist at times for balance.      Pertinent Vitals/Pain Pain Assessment: No/denies pain    Home Living Family/patient expects to be discharged to:: Private residence Living Arrangements: Spouse/significant other Available Help at Discharge: Family;Available 24 hours/day Type of Home: House Home Access: Stairs to enter   Home Layout: Two level;Bed/bath upstairs Home Equipment: Shower seat;Grab bars - tub/shower      Prior Function            PT Goals (current goals can now be found in the care plan section) Acute Rehab PT Goals Patient Stated Goal: tired,  long time today (HD) PT Goal Formulation: With patient Time For Goal Achievement: 11/08/16 Potential to Achieve Goals: Good Progress towards PT goals: Progressing toward goals    Frequency    Min 3X/week      PT Plan Current plan remains appropriate    Co-evaluation  End of Session   Activity Tolerance: Patient tolerated treatment well;Patient limited by fatigue Patient left: in chair;with call bell/phone within reach;with family/visitor present Nurse Communication: Mobility status PT Visit Diagnosis:  Unsteadiness on feet (R26.81);Dizziness and giddiness (R42)     Time: 7409-9278 PT Time Calculation (min) (ACUTE ONLY): 23 min  Charges:  $Gait Training: 8-22 mins                    G CodesTessie [name] Kenneth [name] 11/04/2016, 3:18 PM 11/04/2016  Donnella Sham, PT [phone]  (pager)

## 2016-11-04 NOTE — Progress Notes (Signed)
PT Cancellation Note  Patient Details Name: Kenneth [name] MRN: [id number] DOB: 12/03/1955   Cancelled Treatment:    Reason Eval/Treat Not Completed: Patient at procedure or test/unavailable.  Pt ijn HD.  Will see pt later as able. 11/04/2016  Donnella Sham, PT [phone]  (pager)   Kenneth [name] 11/04/2016, 10:20 AM

## 2016-11-04 NOTE — Progress Notes (Signed)
Inpatient Diabetes Program Recommendations  AACE/ADA: New Consensus Statement on Inpatient Glycemic Control (2015)  Target Ranges:  Prepandial:   less than 140 mg/dL      Peak postprandial:   less than 180 mg/dL (1-2 hours)      Critically ill patients:  140 - 180 mg/dL   Lab Results  Component Value Date   GLUCAP 223 (H) 11/03/2016   HGBA1C 7.7 (H) 10/31/2016    Review of Glycemic ControlResults for Kenneth [name], Kenneth [name] (MRN [id number]) as of 11/04/2016 13:43  Ref. Range 11/01/2016 21:02 11/02/2016 12:48 11/02/2016 15:50 11/02/2016 21:03 11/03/2016 08:48 11/03/2016 12:12 11/03/2016 16:13 11/03/2016 21:47  Glucose-Capillary Latest Ref Range: 65 - 99 mg/dL 251 (H) 145 (H) 199 (H) 280 (H) 152 (H) 183 (H) 298 (H) 223 (H)   Diabetes history: DM Outpatient Diabetes medications: Insulin pump with last admission settings 21.6 units basal/24 hrs + carbohydrate ratio 1 unit:10-12 gms carbohydrate Current orders for Inpatient glycemic control: Novolog correction 0-9 units tid + 0-5 units hs  Inpatient Diabetes Program Recommendations:  Please consider adding basal insulin Levemir 10 units daily until restarted on insulin pump. Will follow.  Adah Perl, RN, BC-ADM Inpatient Diabetes Coordinator Pager [phone] (8a-5p)

## 2016-11-04 NOTE — Progress Notes (Signed)
Subjective: Interval History: has no complaint .  Objective: Vital signs in last 24 hours: Temp:  [97.6 F (36.4 C)-98.2 F (36.8 C)] 97.7 F (36.5 C) (02/21 0448) Pulse Rate:  [53-62] 62 (02/21 0448) Resp:  [0-18] 16 (02/21 0448) BP: (129-159)/(51-67) 159/59 (02/21 0448) SpO2:  [90 %-99 %] 94 % (02/21 0448) Weight:  [62.6 kg (138 lb)] 62.6 kg (138 lb) (02/20 2010) Weight change: 0.596 kg (1 lb 5 oz)  Intake/Output from previous day: 02/20 0701 - 02/21 0700 In: 269 [P.O.:240; I.V.:29] Out: 0  Intake/Output this shift: No intake/output data recorded.  General appearance: alert, cooperative, no distress and pale Resp: rales bibasilar Cardio: S1, S2 normal and systolic murmur: holosystolic 2/6, blowing at apex GI: soft, non-tender; bowel sounds normal; no masses,  no organomegaly Extremities: AVF LUA   Lab Results:  Recent Labs  11/03/16 1142 11/04/16 0606  WBC 7.6 9.7  HGB 7.9* 8.0*  HCT 22.9* 23.6*  PLT 97* 103*   BMET:  Recent Labs  11/03/16 1142 11/04/16 0606  NA 130* 127*  K 4.4 3.9  CL 96* 91*  CO2 23 22  GLUCOSE 173* 172*  BUN 73* 78*  CREATININE 5.65*  5.60* 6.73*  CALCIUM 8.5* 8.3*   No results for input(s): PTH in the last 72 hours. Iron Studies: No results for input(s): IRON, TIBC, TRANSFERRIN, FERRITIN in the last 72 hours.  Studies/Results: No results found.  I have reviewed the patient's current medications.  Assessment/Plan: 1 ESRD to go to [facility] today, has spot at [facility] 2 Anemia esa/Fe 3 DM controlled 4 CAD occluded vein graft.  Will try low dose beta blocker hs and see if bp tol 5 HPTH vit D P HD, toprol hs, d/c soon    LOS: 4 days   Bethan Adamek L 11/04/2016,8:44 AM

## 2016-11-04 NOTE — Procedures (Signed)
I was present at this session.  I have reviewed the session itself and made appropriate changes.  HD via LUA AVF ,small needles, flow 325.  bp 100-110 sys.  tol well   Kenneth [name] L 2/21/201810:54 AM

## 2016-11-04 NOTE — Evaluation (Addendum)
Occupational Therapy Evaluation Patient Details Name: Kenneth [name] MRN: [id number] DOB: August 15, 1956 Today's Date: 11/04/2016    History of Present Illness Kenneth [name] [facility] a 60 y.o.malewith medical history significant of type II diabetes, CKD stage IV requiring HD for a period of time, CAD, and chronic systolic CHF NSEMI September 2017. Pt admitted for worsening somnolence and confusion with progressive leg weakness.   Clinical Impression   PTA, pt reports independence in ADLs and functional mobility and wife performed IADLs (including medical management, cooking, cleaning) and driving. Currently, pt demonstrates decreased balance impacting participation in ADLs. Required Min-Mod A for standing balance due to leaning left and back. Benefit from acute OT to increased independence in ADLs; need to assess ADLs further at next session. Recommend 24/7 supervision for safety upon d/c and HHOT pending pt progress and ADL assessment.    Follow Up Recommendations  Supervision/Assistance - 24 hour;Home health OT [facility] pending pt progress)    Equipment Recommendations  None recommended by OT (Pending pt progress and more information at next session)    Recommendations for Other Services       Precautions / Restrictions Precautions Precautions: Fall Restrictions Weight Bearing Restrictions: No      Mobility Bed Mobility Overal bed mobility: Modified Independent             General bed mobility comments: pt impulsive sprang to EOB using abdominal muscles into long sit position  Transfers Overall transfer level: Needs assistance Equipment used: None Transfers: Sit to/from Stand Sit to Stand: Min assist (Assistance for balance once standing)         General transfer comment: powered up okay and then needed steady assist    Balance Overall balance assessment: Needs assistance Sitting-balance support: Feet supported;Single extremity supported;No upper extremity supported Sitting  balance-Leahy Scale: Fair     Standing balance support: Bilateral upper extremity supported;Single extremity supported Standing balance-Leahy Scale: Poor Standing balance comment: Required Min-Mod Assistance due to unsteadiness and leaning                            ADL Overall ADL's : Needs assistance/impaired         Upper Body Bathing: Minimal assistance;Sitting   Lower Body Bathing: Moderate assistance;Sit to/from stand   Upper Body Dressing : Minimal assistance;Sitting   Lower Body Dressing: Moderate assistance;Sit to/from stand   Toilet Transfer: Cueing for safety;Minimal assistance;Ambulation;Regular Toilet   Toileting- Water quality scientist and Hygiene: Cueing for safety;Sit to/from stand;Moderate assistance   Tub/ Shower Transfer: Maximal assistance   Functional mobility during ADLs: Moderate assistance;Cueing for safety;Cueing for sequencing General ADL Comments: Need to assess ADLs further to see how balance and sequencing impact performance     Vision Baseline Vision/History: Cataracts Patient Visual Report: No change from baseline (Pt demonstrated poor peripheral vision and scanning) Vision Assessment?: Vision impaired- to be further tested in functional context     Perception     Praxis      Pertinent Vitals/Pain Pain Assessment: No/denies pain     Hand Dominance Right   Extremity/Trunk Assessment Upper Extremity Assessment Upper Extremity Assessment: Generalized weakness   Lower Extremity Assessment Lower Extremity Assessment: Generalized weakness   Cervical / Trunk Assessment Cervical / Trunk Assessment: Normal   Communication Communication Communication: Prefers language other than English   Cognition Arousal/Alertness: Awake/alert;Lethargic Behavior During Therapy: WFL for tasks assessed/performed (Very tired from HD) Overall Cognitive Status: History of cognitive impairments - at baseline Area of  Impairment:  Safety/judgement;Problem solving;Following commands       Following Commands: Follows one step commands consistently Safety/Judgement: Decreased awareness of deficits   Problem Solving: Slow processing General Comments: pt slow to respond unsure if it due to impaired cognition or language barrier   General Comments  Going to bathroom, getting in shower, standing at the sink, pt needed min to mod assist at times for balance.    Exercises       Shoulder Instructions      Home Living Family/patient expects to be discharged to:: Private residence Living Arrangements: Spouse/significant other Available Help at Discharge: Family;Available 24 hours/day Type of Home: House Home Access: Stairs to enter     Home Layout: Two level;Bed/bath upstairs     Bathroom Shower/Tub: Occupational psychologist: Standard Bathroom Accessibility: Yes   Home Equipment: Shower seat;Grab bars - tub/shower          Prior Functioning/Environment Level of Independence: Independent        Comments: language barrier to determine PLOF;has nurse come in for medical; wife reports that pt completes basic ADLs        OT Problem List: Decreased strength;Decreased activity tolerance;Impaired balance (sitting and/or standing);Impaired vision/perception;Decreased safety awareness;Decreased knowledge of use of DME or AE      OT Treatment/Interventions: Self-care/ADL training;Therapeutic exercise;Energy conservation;DME and/or AE instruction;Therapeutic activities;Visual/perceptual remediation/compensation;Patient/family education    OT Goals(Current goals can be found in the care plan section) Acute Rehab OT Goals Patient Stated Goal: Feel better  OT Frequency: Min 2X/week   Barriers to D/C:            Co-evaluation              End of Session Equipment Utilized During Treatment: Gait belt Nurse Communication: Mobility status  Activity Tolerance: Patient limited by  fatigue Patient left: in chair;with call bell/phone within reach;with family/visitor present  OT Visit Diagnosis: Unsteadiness on feet (R26.81);Muscle weakness (generalized) (M62.81);Low vision, both eyes (H54.2)                ADL either performed or assessed with clinical judgement  Time: 1437-1501 OT Time Calculation (min): 24 min Charges:  OT Evaluation $OT Eval Moderate Complexity: 1 Procedure G-Codes:     [place], OTR/L [place] 11/04/2016, 4:46 PM

## 2016-11-04 NOTE — Progress Notes (Signed)
Dialysis treatment completed.  1500 mL ultrafiltrated and net fluid removal 1000 mL.    Patient status unchanged. Lung sounds diminished to ausculation in all fields. No edema. Cardiac: Afib.  Disconnected lines and removed needles.  Pressure held for 10 minutes and band aid/gauze dressing applied.  Report given to bedside RN, Lambert Keto.

## 2016-11-04 NOTE — Progress Notes (Signed)
Nutrition Follow-up  DOCUMENTATION CODES:   Not applicable  INTERVENTION:  Provide 30 ml Prostat po BID, each supplement provides 100 kcal and 15 grams of protein.   Encourage adequate PO intake.   NUTRITION DIAGNOSIS:   Increased nutrient needs related to chronic illness (ESRD) as evidenced by estimated needs; ongoing  GOAL:   Patient will meet greater than or equal to 90% of their needs; progressing  MONITOR:   Diet advancement, PO intake, Supplement acceptance, Labs, Skin, I & O's  REASON FOR ASSESSMENT:   Malnutrition Screening Tool    ASSESSMENT:   61 y.o. male with PMH of type II diabetes, CKD stage IV requiring HD for a period of time, CAD, and chronic systolic CHF. He presented to the hospital on 2/16 with worsening somnolence and confusion. He has had progressive leg edema for past few weeks, has been seen by nephrology with plans to restart his hemodialysis on Monday.  Pt was on HD during time of visit. Pt reports appetite as "ok". Pt reports no breakfast this AM however po at lunch was 100%. Pt was offered Nepro Shake however refused. RD to order Prostat instead to aid in protein needs.   Labs and medications reviewed.   Diet Order:  Diet renal/carb modified with fluid restriction Diet-HS Snack? Nothing; Room service appropriate? Yes; Fluid consistency: Thin  Skin:  Wound (see comment) (Stage I to R heel)  Last BM:  2/19  Height:   Ht Readings from Last 1 Encounters:  10/31/16 5\' 6"  (1.676 m)    Weight:   Wt Readings from Last 1 Encounters:  11/04/16 132 lb 0.9 oz (59.9 kg)    Ideal Body Weight:  64.5 kg  BMI:  Body mass index is 21.31 kg/m.  Estimated Nutritional Needs:   Kcal:  1900-2200  Protein:  80-95 gm  Fluid:  1.2 L  EDUCATION NEEDS:   No education needs identified at this time  Corrin Parker, MS, RD, LDN Pager # [phone] After hours/ weekend pager # [phone]

## 2016-11-05 ENCOUNTER — Inpatient Hospital Stay (HOSPITAL_COMMUNITY): Payer: Medicare Other

## 2016-11-05 LAB — COMPREHENSIVE METABOLIC PANEL
ALT: 272 U/L — ABNORMAL HIGH (ref 17–63)
ANION GAP: 11 (ref 5–15)
AST: 111 U/L — ABNORMAL HIGH (ref 15–41)
Albumin: 2.8 g/dL — ABNORMAL LOW (ref 3.5–5.0)
Alkaline Phosphatase: 163 U/L — ABNORMAL HIGH (ref 38–126)
BILIRUBIN TOTAL: 0.7 mg/dL (ref 0.3–1.2)
BUN: 33 mg/dL — AB (ref 6–20)
CO2: 27 mmol/L (ref 22–32)
Calcium: 8.5 mg/dL — ABNORMAL LOW (ref 8.9–10.3)
Chloride: 92 mmol/L — ABNORMAL LOW (ref 101–111)
Creatinine, Ser: 4.9 mg/dL — ABNORMAL HIGH (ref 0.61–1.24)
GFR, EST AFRICAN AMERICAN: 14 mL/min — AB (ref >60)
GFR, EST NON AFRICAN AMERICAN: 12 mL/min — AB (ref >60)
Glucose, Bld: 169 mg/dL — ABNORMAL HIGH (ref 65–99)
POTASSIUM: 3.6 mmol/L (ref 3.5–5.1)
Sodium: 130 mmol/L — ABNORMAL LOW (ref 135–145)
TOTAL PROTEIN: 6.3 g/dL — AB (ref 6.5–8.1)

## 2016-11-05 LAB — CBC
HEMATOCRIT: 26.2 % — AB (ref 39.0–52.0)
Hemoglobin: 8.9 g/dL — ABNORMAL LOW (ref 13.0–17.0)
MCH: 30.5 pg (ref 26.0–34.0)
MCHC: 34 g/dL (ref 30.0–36.0)
MCV: 89.7 fL (ref 78.0–100.0)
Platelets: 123 10*3/uL — ABNORMAL LOW (ref 150–400)
RBC: 2.92 MIL/uL — ABNORMAL LOW (ref 4.22–5.81)
RDW: 13.7 % (ref 11.5–15.5)
WBC: 11.4 10*3/uL — ABNORMAL HIGH (ref 4.0–10.5)

## 2016-11-05 LAB — GLUCOSE, CAPILLARY
GLUCOSE-CAPILLARY: 176 mg/dL — AB (ref 65–99)
Glucose-Capillary: 240 mg/dL — ABNORMAL HIGH (ref 65–99)

## 2016-11-05 MED ORDER — METOPROLOL SUCCINATE ER 25 MG PO TB24: 12.5000 mg | ORAL_TABLET | Freq: Every day | ORAL | 1 refills | Status: DC

## 2016-11-05 MED ORDER — ASPIRIN 81 MG PO CHEW: 81.0000 mg | CHEWABLE_TABLET | Freq: Every day | ORAL | 1 refills | Status: DC

## 2016-11-05 MED ORDER — CLOPIDOGREL BISULFATE 75 MG PO TABS: 75.0000 mg | ORAL_TABLET | Freq: Every day | ORAL | 1 refills | Status: DC

## 2016-11-05 NOTE — Care Management Note (Signed)
Case Management Note  Patient Details  Name: Kenneth [name] MRN: [id number] Date of Birth: 1956-02-15  Subjective/Objective:      CM following for progression and d/c planning.               Action/Plan: 11/05/2016 Met with pt and wife re HH needs, requesting rolling walker, order placed and [facility] requested for HHPT. [facility] notified of [place] needs.   Expected Discharge Date:                  Expected Discharge Plan:  [place]  In-House Referral:     Discharge planning Services  CM Consult  Post Acute Care Choice:  Durable Medical Equipment, Home Health Choice offered to:  Patient  DME Arranged:  Walker rolling DME Agency:  [place] Arranged:  PT [facility] Agency:  [facility]  Status of Service:  Completed, signed off  If discussed at [place] of Stay Meetings, dates discussed:    Additional Comments:  Adron Bene, RN 11/05/2016, 12:33 PM

## 2016-11-05 NOTE — Progress Notes (Signed)
Physical Therapy Treatment Patient Details Name: Kenneth [name] MRN: [id number] DOB: 1956/05/27 Today's Date: 11/05/2016    History of Present Illness Kenneth [name] [facility] a 60 y.o.malewith medical history significant of type II diabetes, CKD stage IV requiring HD for a period of time, CAD, and chronic systolic CHF NSEMI September 2017. Pt admitted for worsening somnolence and confusion with progressive leg weakness.    PT Comments    Much improved over yesterday, post HD.  Still some unsteadiness and drift L with fatigue.    Follow Up Recommendations  Home health PT;Supervision/Assistance - 24 hour     Equipment Recommendations  Rolling walker with 5" wheels    Recommendations for Other Services       Precautions / Restrictions Precautions Precautions: Fall    Mobility  Bed Mobility Overal bed mobility: Modified Independent             General bed mobility comments: pt impulsive sprang to EOB using abdominal muscles into long sit position  Transfers Overall transfer level: Needs assistance Equipment used: None Transfers: Sit to/from Stand Sit to Stand: Supervision         General transfer comment: much more steady today  Ambulation/Gait Ambulation/Gait assistance: Min guard Ambulation Distance (Feet): 500 Feet Assistive device: None Gait Pattern/deviations: Step-through pattern     General Gait Details: mildly unsteady the more fatigue, otherwise generally steady. see DGI   Stairs            Wheelchair Mobility    Modified Rankin (Stroke Patients Only)       Balance Overall balance assessment: Needs assistance Sitting-balance support: Feet supported Sitting balance-Leahy Scale: Good     Standing balance support: Bilateral upper extremity supported;Single extremity supported Standing balance-Leahy Scale: Fair Standing balance comment: statically could maintain stance with divided attension on his phone.                     Cognition Arousal/Alertness: Awake/alert Behavior During Therapy: WFL for tasks assessed/performed Overall Cognitive Status: Within Functional Limits for tasks assessed                      Exercises General Exercises - Lower Extremity Quad Sets: AROM;Strengthening;Both;10 reps;Supine Gluteal Sets: AROM;Strengthening;Both;10 reps;Supine Heel Slides: AROM;Strengthening;Both;10 reps;Supine (graded resistance) Straight Leg Raises: AROM;Strengthening;Both;10 reps;Supine Other Exercises Other Exercises: bridging x10 Other Exercises: bicep/tricep presses x 10 reps.    General Comments General comments (skin integrity, edema, etc.): still with fall risk based on DGI, (as expected)      Pertinent Vitals/Pain Pain Assessment: No/denies pain    Home Living                      Prior Function            PT Goals (current goals can now be found in the care plan section) Acute Rehab PT Goals Patient Stated Goal: Feel better PT Goal Formulation: With patient Time For Goal Achievement: 11/08/16 Potential to Achieve Goals: Good Progress towards PT goals: Progressing toward goals    Frequency    Min 3X/week      PT Plan Current plan remains appropriate    Co-evaluation             End of Session   Activity Tolerance: Patient tolerated treatment well;Patient limited by fatigue Patient left: in chair;with call bell/phone within reach;with chair alarm set Nurse Communication: Mobility status PT Visit Diagnosis: Unsteadiness on feet (R26.81)  Time: 3475-8307 PT Time Calculation (min) (ACUTE ONLY): 21 min  Charges:  $Gait Training: 8-22 mins                    G CodesTessie Fass Faustina Gebert 11/05/2016, 10:54 AM  11/05/2016  Donnella Sham, PT [phone]  (pager)

## 2016-11-05 NOTE — Discharge Instructions (Addendum)
Follow with YU, JULIA Mirian Capuchin, MD in 5-7 days  Please follow up with dialysis tomorrow as scheduled  Please get a complete blood count and chemistry panel checked by your Primary MD at your next visit, and again as instructed by your Primary MD. Please get your medications reviewed and adjusted by your Primary MD.  Please request your Primary MD to go over all Hospital Tests and Procedure/Radiological results at the follow up, please get all Hospital records sent to your Prim MD by signing hospital release before you go home.  If you had Pneumonia of Lung problems at the Hospital: Please get a 2 view Chest X ray done in 6-8 weeks after hospital discharge or sooner if instructed by your Primary MD.  If you have Congestive Heart Failure: Please call your Cardiologist or Primary MD anytime you have any of the following symptoms:  1) 3 pound weight gain in 24 hours or 5 pounds in 1 week  2) shortness of breath, with or without a dry hacking cough  3) swelling in the hands, feet or stomach  4) if you have to sleep on extra pillows at night in order to breathe  Follow cardiac low salt diet and 1.5 lit/day fluid restriction.  If you have diabetes Accuchecks 4 times/day, Once in AM empty stomach and then before each meal. Log in all results and show them to your primary doctor at your next visit. If any glucose reading is under 80 or above 300 call your primary MD immediately.  If you have Seizure/Convulsions/Epilepsy: Please do not drive, operate heavy machinery, participate in activities at heights or participate in high speed sports until you have seen by Primary MD or a Neurologist and advised to do so again.  If you had Gastrointestinal Bleeding: Please ask your Primary MD to check a complete blood count within one week of discharge or at your next visit. Your endoscopic/colonoscopic biopsies that are pending at the time of discharge, will also need to followed by your Primary MD.  Get  Medicines reviewed and adjusted. Please take all your medications with you for your next visit with your Primary MD  Please request your Primary MD to go over all hospital tests and procedure/radiological results at the follow up, please ask your Primary MD to get all Hospital records sent to his/her office.  If you experience worsening of your admission symptoms, develop shortness of breath, life threatening emergency, suicidal or homicidal thoughts you must seek medical attention immediately by calling 911 or calling your MD immediately  if symptoms less severe.  You must read complete instructions/literature along with all the possible adverse reactions/side effects for all the Medicines you take and that have been prescribed to you. Take any new Medicines after you have completely understood and accpet all the possible adverse reactions/side effects.   Do not drive or operate heavy machinery when taking Pain medications.   Do not take more than prescribed Pain, Sleep and Anxiety Medications  Special Instructions: If you have smoked or chewed Tobacco  in the last 2 yrs please stop smoking, stop any regular Alcohol  and or any Recreational drug use.  Wear Seat belts while driving.  Please note You were cared for by a hospitalist during your hospital stay. If you have any questions about your discharge medications or the care you received while you were in the hospital after you are discharged, you can call the unit and asked to speak with the hospitalist on call if  the hospitalist that took care of you is not available. Once you are discharged, your primary care physician will handle any further medical issues. Please note that NO REFILLS for any discharge medications will be authorized once you are discharged, as it is imperative that you return to your primary care physician (or establish a relationship with a primary care physician if you do not have one) for your aftercare needs so that they  can reassess your need for medications and monitor your lab values.  You can reach the hospitalist office at phone [phone] or fax [phone]   If you do not have a primary care physician, you can call [phone] for a physician referral.  Activity: As tolerated with Full fall precautions use walker/cane & assistance as needed  Diet: renal  Disposition Home

## 2016-11-05 NOTE — Progress Notes (Signed)
   Right femoral cath site is stable. No further cardiac workup. We will sign off.  Daryel November, MD

## 2016-11-05 NOTE — Progress Notes (Signed)
Occupational Therapy Treatment Patient Details Name: Kenneth [name] MRN: [id number] DOB: [birth year] Today's Date: 11/05/2016    History of present illness [facility] a 61 y.o.malewith medical history significant of type II diabetes, CKD stage IV requiring HD for a period of time, CAD, and chronic systolic CHF NSEMI September 2017. Pt admitted for worsening somnolence and confusion with progressive leg weakness.   OT comments  Pt demonstrated increased activity tolerance today and performed ADLs,  transfers, and functional mobility with supervision for safety due to tendancy to rush and move too quickly. Pt and spouse educated on LB dressing techniques to decreased falls; pt verbalized understanding. Educated pt on energy conservation and fall prevention techniques to facilitate safe d/c home; pt verbalized understanding. Completed education to facilitate safe d/c once medically stable per physician. Continue recommend 24/7 supervision initially and HHOT to increase pt safety and occupational performance at home.    Follow Up Recommendations  Supervision/Assistance - 24 hour;Home health OT    Equipment Recommendations  None recommended by OT    Recommendations for Other Services      Precautions / Restrictions Precautions Precautions: Fall Restrictions Weight Bearing Restrictions: No       Mobility Bed Mobility Overal bed mobility: Modified Independent             General bed mobility comments: pt impulsive sprang to EOB  Transfers Overall transfer level: Needs assistance Equipment used: None Transfers: Sit to/from Stand Sit to Stand: Supervision         General transfer comment: much more steady today    Balance Overall balance assessment: Needs assistance Sitting-balance support: Feet supported Sitting balance-Leahy Scale: Good     Standing balance support: No upper extremity supported;During functional activity Standing balance-Leahy Scale: Fair                      ADL Overall ADL's : Needs assistance/impaired Eating/Feeding: Set up   Grooming: Oral care;Standing;Supervision/safety           Upper Body Dressing : Supervision/safety;Sitting   Lower Body Dressing: Supervision/safety;Cueing for safety;Sit to/from stand Lower Body Dressing Details (indicate cue type and reason): Cues to slow down and take his time Toilet Transfer: Supervision/safety;Regular Toilet;Ambulation           Functional mobility during ADLs: Supervision/safety General ADL Comments: Pt performed ADLs and functional mobility much better on a non-HD day; demonstrated impulsive behavior and rushing during ADLs and benefited from cues to slow down      Vision                     Perception     Praxis      Cognition   Behavior During Therapy: [facility] for tasks assessed/performed Overall Cognitive Status: Within Functional Limits for tasks assessed                         Exercises     Shoulder Instructions       General Comments      Pertinent Vitals/ Pain       Pain Assessment: No/denies pain  Home Living                                          Prior Functioning/Environment  Frequency  Min 2X/week        Progress Toward Goals  OT Goals(current goals can now be found in the care plan section)  Progress towards OT goals: Progressing toward goals  Acute Rehab OT Goals Patient Stated Goal: Feel better  Plan Discharge plan remains appropriate    Co-evaluation                 End of Session Equipment Utilized During Treatment: Gait belt  OT Visit Diagnosis: Unsteadiness on feet (R26.81);Muscle weakness (generalized) (M62.81);Low vision, both eyes (H54.2)   Activity Tolerance Patient tolerated treatment well   Patient Left in bed;with call bell/phone within reach;with family/visitor present;with nursing/sitter in room   Nurse Communication Mobility status         Time: 4383-7793 OT Time Calculation (min): 19 min  Charges: OT General Charges $OT Visit: 1 Procedure OT Treatments $Self Care/Home Management : 8-22 mins  [place], OTR/L [place] 11/05/2016, 3:03 PM

## 2016-11-05 NOTE — Discharge Summary (Signed)
Physician Discharge Summary  ORVILL COULTHARD KYH:[id number] DOB: Jul 15, 1956 DOA: 10/30/2016  PCP: [name], MD  Admit date: 10/30/2016 Discharge date: 11/05/2016  Admitted From: Home Disposition: Home  Recommendations for Outpatient Follow-up:  1. Follow up with PCP in 1-2 weeks 2. Home health PT on discharge 3. Hemodialysis in 1 day as arranged by nephrology  Home Health: Physical therapy Equipment/Devices: Rolling walker  Discharge Condition: Stable CODE STATUS: Full code Diet recommendation: Heart healthy, diabetic, renal  HPI: Per Dr. Roel Cluck, BRAVERY KETCHAM is a 61 y.o. male with medical history significant of type II diabetes, CKD stage IV requiring HD for a period of time, CAD, and chronic systolic CHF NSEMI September 2017 Presented with worsening somnolence and confusion he have had progressive leg edema for past few weeks has been seen by nephrology who tried to increase his Lasix. His nephrologist has told him that he'll probably will need to restart his hemodialysis the plan was to do so on Monday. Has access in place. Been having abdominal swelling increased respirations have been having some nonproductive cough but this been going on for months some chills and nausea no vomiting no fevers he have had decreased urinary output today had not urinated at all.  As dialysis was in December Regarding pertinent Chronic problems: hx of CAD LHC 3 vessel disease. Requiring CABG Post CABG he developed atrial fibrillation but that was only isolated episodes CKD stage IV transiently requiring hemodialysis post CABG cardiogenic shock requiring milrinone drip, last echogram in September 2017 showed EF 35-40 percent repeat echogram in January 2018 showed EF improved to 60-65 percent Significant hypertension recently had his Lasix increased as well as his hydralazine his blood pressure has been trending up since his dialysis has been discontinued.  Hospital Course: Discharge Diagnoses:    Active Problems:   Acute pulmonary edema (HCC)   CHF (congestive heart failure) (HCC)   ESRD (end stage renal disease) ([place])   Acute encephalopathy   Uremia   CAD (coronary artery disease)   DM (diabetes mellitus), type 2 with renal complications (HCC)   Elevated troponin   Pressure injury of skin   ESRD -patient with progressive renal failure last year requiring hemodialysis, there was in the setting of previous and NSTEMI in 2017, and around the time of his CABG, he was able to come off of dialysis however during this hospitalization he will need to resume HD. Clip process arranged by nephrology, stable to discharge from their standpoint, he will need to follow-up with outpatient hemodialysis in 1 day. NSTEMI -History of coronary artery disease as well as previous and NSTEMI in September 2017, status post CABG 3.  Patient had elevated troponin which peaked at 4.2, and subsequently trended down.  Cardiology was consulted and have follow patient while hospitalized.  He was taken to the Cath Lab on 2/20, and they showed normal LV function, EF 50% (full details below).  Continued medical management was recommended at this point with dual antiplatelet therapy with aspirin and Plavix, as well as high intensity statin. Elevated LFTs -unclear etiology, likely in the setting of hypotension during his initial admit days.  His LFTs are improving.  He underwent right upper quadrant ultrasound which showed cholelithiasis and sludge, patient has no right upper quadrant tenderness, he is asymptomatic, has no nausea or vomiting, will need to be continued to be monitored as an outpatient Paroxysmal atrial fibrillation -CHA2DS2VASc 3, this was a one-time episode in 2017 following his CABG, Cardiology does  not feel that patient needs long-term anticoagulation Chronic diastolic heart failure with ischemic cardiomyopathy -Echo 09/2016 with EF 60-65% and grade 2 diastolic dysfunction, pain management now with  hemodialysis, his Coreg was held due to bradycardia and he was switched to metoprolol per cardiology. Elevated TSH -Free T4 is normal. Recommend repeat TSH in 6-8 weeks as outpatient once acute illness resolves. Anemia of chronic kidney disease -EPO per nephro. S/p 1u pRBC.  DM type 2 -Ha1c 7.7, continue pump   Discharge Instructions   Allergies as of 11/05/2016      Reactions   Hydralazine Rash      Medication List    STOP taking these medications   aspirin 325 MG EC tablet Replaced by:  aspirin 81 MG chewable tablet   carvedilol 6.25 MG tablet Commonly known as:  COREG   cefUROXime 250 MG tablet Commonly known as:  CEFTIN   furosemide 40 MG tablet Commonly known as:  LASIX     TAKE these medications   albuterol 108 (90 Base) MCG/ACT inhaler Commonly known as:  PROVENTIL HFA;VENTOLIN HFA Inhale 2 puffs into the lungs every 6 (six) hours as needed for wheezing.   aspirin 81 MG chewable tablet Chew 1 tablet (81 mg total) by mouth daily. Start taking on:  11/06/2016 Replaces:  aspirin 325 MG EC tablet   atorvastatin 80 MG tablet Commonly known as:  LIPITOR Take 1 tablet (80 mg total) by mouth daily.   citalopram 20 MG tablet Commonly known as:  CELEXA Take 1 tablet (20 mg total) by mouth daily.   clopidogrel 75 MG tablet Commonly known as:  PLAVIX Take 1 tablet (75 mg total) by mouth daily. Start taking on:  11/06/2016   ezetimibe 10 MG tablet Commonly known as:  ZETIA Take 1 tablet (10 mg total) by mouth daily.   hydrALAZINE 50 MG tablet Commonly known as:  APRESOLINE Take 1.5 tablets (75 mg total) by mouth 2 (two) times daily.   insulin pump Soln Inject into the skin continuous. Novolog   metoprolol succinate 25 MG 24 hr tablet Commonly known as:  TOPROL-XL Take 0.5 tablets (12.5 mg total) by mouth at bedtime.   multivitamin with minerals Tabs tablet Take 1 tablet by mouth at bedtime.   promethazine-codeine 6.25-10 MG/5ML syrup Commonly known as:   PHENERGAN with CODEINE Take 5 mLs by mouth every 6 (six) hours as needed for cough.            Durable Medical Equipment        Start     Ordered   11/05/16 1237  For home use only DME Walker rolling  Once    Question:  Patient needs a walker to treat with the following condition  Answer:  Weakness   11/05/16 1239      Allergies  Allergen Reactions  . Hydralazine Rash    Consultations:  Cardiology   Nephrology   Procedures/Studies:  Cardiac catheterization  Mid LAD to Dist LAD lesion, 90 %stenosed.  Ost 2nd Diag to 2nd Diag lesion, 95 %stenosed.  Ramus lesion, 80 %stenosed.  Prox Cx lesion, 90 %stenosed.  Ost 1st Mrg to 1st Mrg lesion, 100 %stenosed.  Prox RCA lesion, 95 %stenosed.  Mid RCA lesion, 100 %stenosed.  SVG and is normal in caliber and anatomically normal.  LIMA and is normal in caliber and anatomically normal.  Dist LAD lesion, 20 %stenosed.  Graft was visualized by angiography and is normal in caliber and anatomically normal.  Origin to Prox  Graft lesion, 100 %stenosed.  Ost LM to LM lesion, 40 %stenosed.   Normal LV function with an ejection fraction of 50% with mild mid anterolateral and mid inferior hypocontractility.  Significant native CAD with 30% smooth distal left main stenosis; diffuse 90% proximal to mid LAD stenoses with 95% diffuse stenosis in the diagonal vessel, which was not bypassed; diffuse 80% stenosis in a small intermediate vessel; 90% proximal left circumflex stenosis, without visualization antegrade of the OM1 vessel; and total proximal RCA occlusion.  Patent LIMA graft supplying the mid LAD with some views suggesting a mild 20% eccentric narrowing just beyond the anastomosis.  There is collateralization from the distal LAD to the distal RCA and faint collateralization to the distal diagonal vessel.  Patent SVG supplying the large OM branch with extensive collateralization supplying the entire RCA up to the point  of proximal RCA occlusion.  Occluded SVG which had supplied the RCA.  RECOMMENDATION: Will initially attempt continued medical therapy with optimization of medical management.  Ct Head Wo Contrast  Result Date: 10/30/2016 CLINICAL DATA:  61 y/o  M; altered mental status. EXAM: CT HEAD WITHOUT CONTRAST TECHNIQUE: Contiguous axial images were obtained from the base of the skull through the vertex without intravenous contrast. COMPARISON:  None. FINDINGS: Brain: No evidence of acute infarction, hemorrhage, hydrocephalus, extra-axial collection or mass lesion/mass effect. Vascular: Calcific atherosclerosis of cavernous and paraclinoid internal carotid arteries. Skull: Normal. Negative for fracture or focal lesion. Sinuses/Orbits: No acute finding. Other: None. IMPRESSION: 1. No acute intracranial abnormality identified. 2. Unremarkable CT of the head for age. Electronically Signed   By: Kristine Garbe M.D.   On: 10/30/2016 22:12   Dg Chest Port 1 View  Result Date: 10/30/2016 CLINICAL DATA:  61 y/o  M; altered mental status. EXAM: PORTABLE CHEST 1 VIEW COMPARISON:  07/14/2016 chest radiograph FINDINGS: Stable cardiac silhouette given projection and technique. Aortic atherosclerosis with calcification. Sternotomy wires are aligned. Transcutaneous pacers noted. Diffuse hazy opacification of the lungs probably represents alveolar pulmonary edema. No acute osseous abnormality. IMPRESSION: Moderate alveolar pulmonary edema. Stable cardiac silhouette. Aortic atherosclerosis. Electronically Signed   By: Kristine Garbe M.D.   On: 10/30/2016 21:24   US Abdomen Limited Ruq  Result Date: 11/05/2016 CLINICAL DATA:  Elevated liver enzymes.  Renal failure. EXAM: US ABDOMEN LIMITED - RIGHT UPPER QUADRANT COMPARISON:  Abdominal CT January 01, 2005 FINDINGS: Gallbladder: Within the gallbladder, there are multiple echogenic foci which move and shadow consistent with cholelithiasis. Largest gallstone  measures 4 mm in length. There is also sludge in the gallbladder. The gallbladder wall is mildly thickened without pericholecystic fluid evident. No sonographic Murphy sign noted by sonographer. Common bile duct: Diameter: 4 mm. No intrahepatic or extrahepatic biliary duct dilatation. Liver: No focal lesion identified. Within normal limits in parenchymal echogenicity. There is a small amount of ascites. There is a right pleural effusion. The right kidney is noted to be echogenic. IMPRESSION: Cholelithiasis and sludge in gallbladder. Gallbladder wall is mildly thickened. Gallbladder wall thickening may be seen with ascites. The possibility of early cholecystitis must be of concern, however. Given this circumstance, it may be reasonable to correlate with nuclear medicine hepatobiliary imaging study to assess for patency of the cystic duct. Note that there is no pericholecystic fluid currently. There is mild ascites as well as a right pleural effusion. Echogenic right kidney, a finding most likely indicative of underlying medical renal disease. Electronically Signed   By: Lowella Grip III M.D.   On: 11/05/2016  07:30     Subjective: - no chest pain, shortness of breath, no abdominal pain, nausea or vomiting.   Discharge Exam: Vitals:   11/05/16 0600 11/05/16 0901  BP: (!) 142/79 140/62  Pulse: 100 68  Resp: 20 18  Temp: 98.7 F (37.1 C) 99 F (37.2 C)   Vitals:   11/04/16 2130 11/05/16 0456 11/05/16 0600 11/05/16 0901  BP: (!) 123/58 132/72 (!) 142/79 140/62  Pulse: 93 91 100 68  Resp: 20 19 20 18   Temp: 98.6 F (37 C) 98 F (36.7 C) 98.7 F (37.1 C) 99 F (37.2 C)  TempSrc: Oral Oral Oral Oral  SpO2: 98% 93% 96% 100%  Weight: 59.6 kg (131 lb 6.4 oz)     Height:       General: Pt is alert, awake, not in acute distress Cardiovascular: RRR, S1/S2 +, no rubs, no gallops Respiratory: CTA bilaterally, no wheezing, no rhonchi Abdominal: Soft, NT, ND, bowel sounds + Extremities: no  edema, no cyanosis  The results of significant diagnostics from this hospitalization (including imaging, microbiology, ancillary and laboratory) are listed below for reference.     Microbiology: Recent Results (from the past 240 hour(s))  Blood Culture (routine x 2)     Status: None   Collection Time: 10/30/16  8:44 PM  Result Value Ref Range Status   Specimen Description BLOOD RIGHT ANTECUBITAL  Final   Special Requests BOTTLES DRAWN AEROBIC AND ANAEROBIC 5CC  Final   Culture NO GROWTH 5 DAYS  Final   Report Status 11/04/2016 FINAL  Final  Blood Culture (routine x 2)     Status: None   Collection Time: 10/30/16  9:03 PM  Result Value Ref Range Status   Specimen Description BLOOD RIGHT HAND  Final   Special Requests BOTTLES DRAWN AEROBIC AND ANAEROBIC 5CC  Final   Culture NO GROWTH 5 DAYS  Final   Report Status 11/04/2016 FINAL  Final  MRSA PCR Screening     Status: None   Collection Time: 10/31/16  3:26 AM  Result Value Ref Range Status   MRSA by PCR NEGATIVE NEGATIVE Final    Comment:        The GeneXpert MRSA Assay (FDA approved for NASAL specimens only), is one component of a comprehensive MRSA colonization surveillance program. It is not intended to diagnose MRSA infection nor to guide or monitor treatment for MRSA infections.     Labs: BNP (last 3 results)  Recent Labs  05/31/16 1435 09/22/16 1432 10/30/16 2024  BNP 2,537.9* 1,972.0* 5,329.9*   Basic Metabolic Panel:  Recent Labs Lab 10/31/16 0332 10/31/16 0846 11/01/16 1715 11/02/16 0215 11/03/16 0209 11/03/16 1142 11/04/16 0606 11/05/16 0602  NA 135 136 130* 130* 130* 130* 127* 130*  K 4.7 4.4 4.7 5.1 4.1 4.4 3.9 3.6  CL 99* 99* 94* 94* 95* 96* 91* 92*  CO2 22 19* 22 20* 25 23 22 27   GLUCOSE 198* 150* 229* 195* 201* 173* 172* 169*  BUN 128* 129* 96* 101* 68* 73* 78* 33*  CREATININE 6.19*  6.08* 6.30* 5.53* 6.10* 5.30* 5.65*  5.60* 6.73* 4.90*  CALCIUM 9.0 9.1 8.4* 8.7* 8.3* 8.5* 8.3* 8.5*    MG  --  2.5* 2.4  --   --   --   --   --   PHOS 5.7* 5.6*  --   --   --  5.9*  --   --    Liver Function Tests:  Recent Labs Lab 10/31/16 [phone]  11/02/16 0215 11/03/16 0209 11/03/16 1142 11/04/16 1424 11/05/16 0602  AST 42* 129* 404*  --  150* 111*  ALT 69* 122* 497*  --  336* 272*  ALKPHOS 105 143* 192*  --  182* 163*  BILITOT 1.1 0.9 0.7  --  1.4* 0.7  PROT 6.6 6.4* 5.6*  --  6.5 6.3*  ALBUMIN 3.2* 3.0* 2.7* 2.7* 2.9* 2.8*   No results for input(s): LIPASE, AMYLASE in the last 168 hours.  Recent Labs Lab 10/30/16 2059  AMMONIA 14   CBC:  Recent Labs Lab 10/30/16 2024  11/02/16 0215 11/03/16 0209 11/03/16 1142 11/04/16 0606 11/05/16 0602  WBC 9.3  < > 9.1 8.6 7.6 9.7 11.4*  NEUTROABS 6.2  --   --   --   --   --   --   HGB 8.7*  < > 7.6* 8.0* 7.9* 8.0* 8.9*  HCT 26.4*  < > 23.7* 23.4* 22.9* 23.6* 26.2*  MCV 89.8  < > 90.5 88.6 89.8 90.1 89.7  PLT 163  < > 161 107* 97* 103* 123*  < > = values in this interval not displayed. Cardiac Enzymes:  Recent Labs Lab 10/31/16 1436 11/02/16 0215 11/02/16 0525 11/02/16 1336 11/02/16 1601  CKTOTAL  --   --  138  --   --   CKMB  --   --  12.2*  --   --   TROPONINI 0.08* 4.20* 3.83* 3.61* 3.22*   BNP: Invalid input(s): POCBNP CBG:  Recent Labs Lab 11/03/16 2147 11/04/16 1628 11/04/16 2127 11/05/16 0727 11/05/16 1156  GLUCAP 223* 207* 242* 176* 240*   D-Dimer No results for input(s): DDIMER in the last 72 hours. Hgb A1c No results for input(s): HGBA1C in the last 72 hours. Lipid Profile No results for input(s): CHOL, HDL, LDLCALC, TRIG, CHOLHDL, LDLDIRECT in the last 72 hours. Thyroid function studies No results for input(s): TSH, T4TOTAL, T3FREE, THYROIDAB in the last 72 hours.  Invalid input(s): FREET3 Anemia work up No results for input(s): VITAMINB12, FOLATE, FERRITIN, TIBC, IRON, RETICCTPCT in the last 72 hours. Urinalysis    Component Value Date/Time   COLORURINE YELLOW 10/30/2016 2339    APPEARANCEUR CLEAR 10/30/2016 2339   LABSPEC 1.010 10/30/2016 2339   PHURINE 5.0 10/30/2016 2339   GLUCOSEU NEGATIVE 10/30/2016 2339   HGBUR NEGATIVE 10/30/2016 2339   BILIRUBINUR NEGATIVE 10/30/2016 2339   KETONESUR NEGATIVE 10/30/2016 2339   PROTEINUR 100 (A) 10/30/2016 2339   NITRITE NEGATIVE 10/30/2016 2339   LEUKOCYTESUR NEGATIVE 10/30/2016 2339   Sepsis Labs Invalid input(s): PROCALCITONIN,  WBC,  LACTICIDVEN Microbiology Recent Results (from the past 240 hour(s))  Blood Culture (routine x 2)     Status: None   Collection Time: 10/30/16  8:44 PM  Result Value Ref Range Status   Specimen Description BLOOD RIGHT ANTECUBITAL  Final   Special Requests BOTTLES DRAWN AEROBIC AND ANAEROBIC 5CC  Final   Culture NO GROWTH 5 DAYS  Final   Report Status 11/04/2016 FINAL  Final  Blood Culture (routine x 2)     Status: None   Collection Time: 10/30/16  9:03 PM  Result Value Ref Range Status   Specimen Description BLOOD RIGHT HAND  Final   Special Requests BOTTLES DRAWN AEROBIC AND ANAEROBIC 5CC  Final   Culture NO GROWTH 5 DAYS  Final   Report Status 11/04/2016 FINAL  Final  MRSA PCR Screening     Status: None   Collection Time: 10/31/16  3:26 AM  Result Value  Ref Range Status   MRSA by PCR NEGATIVE NEGATIVE Final    Comment:        The GeneXpert MRSA Assay (FDA approved for NASAL specimens only), is one component of a comprehensive MRSA colonization surveillance program. It is not intended to diagnose MRSA infection nor to guide or monitor treatment for MRSA infections.      Time coordinating discharge: 40 minutes  SIGNED:  Marzetta Board, MD  [facility] 11/05/2016, 1:04 PM Pager [phone]  If 7PM-7AM, please contact night-coverage [email or web address] Password [email or web address]

## 2016-11-05 NOTE — Progress Notes (Signed)
[facility] Progress Note   Subjective:  Sitting up in chair talking with wife. VERY pleasant, looks great. No C/O chest pain/SOB. Anxious to go home.   Objective Vitals:   11/04/16 2130 11/05/16 0456 11/05/16 0600 11/05/16 0901  BP: (!) 123/58 132/72 (!) 142/79 140/62  Pulse: 93 91 100 68  Resp: 20 19 20 18   Temp: 98.6 F (37 C) 98 F (36.7 C) 98.7 F (37.1 C) 99 F (37.2 C)  TempSrc: Oral Oral Oral Oral  SpO2: 98% 93% 96% 100%  Weight: 59.6 kg (131 lb 6.4 oz)     Height:        2/16 CXR +bilat pulm edema   Physical Exam General: WNWD NAD.  Heart: S1,S2, RRR Lungs: CTAB A/P Abdomen: soft, non-distended Extremities: No LE edema Dialysis Access: LUA AVF + bruit  Additional Objective Labs: Basic Metabolic Panel:  Recent Labs Lab 10/31/16 0332 10/31/16 0846  11/03/16 1142 11/04/16 0606 11/05/16 0602  NA 135 136  < > 130* 127* 130*  K 4.7 4.4  < > 4.4 3.9 3.6  CL 99* 99*  < > 96* 91* 92*  CO2 22 19*  < > 23 22 27   GLUCOSE 198* 150*  < > 173* 172* 169*  BUN 128* 129*  < > 73* 78* 33*  CREATININE 6.19*  6.08* 6.30*  < > 5.65*  5.60* 6.73* 4.90*  CALCIUM 9.0 9.1  < > 8.5* 8.3* 8.5*  PHOS 5.7* 5.6*  --  5.9*  --   --   < > = values in this interval not displayed. Liver Function Tests:  Recent Labs Lab 11/03/16 0209 11/03/16 1142 11/04/16 1424 11/05/16 0602  AST 404*  --  150* 111*  ALT 497*  --  336* 272*  ALKPHOS 192*  --  182* 163*  BILITOT 0.7  --  1.4* 0.7  PROT 5.6*  --  6.5 6.3*  ALBUMIN 2.7* 2.7* 2.9* 2.8*   No results for input(s): LIPASE, AMYLASE in the last 168 hours. CBC:  Recent Labs Lab 10/30/16 2024  11/02/16 0215 11/03/16 0209 11/03/16 1142 11/04/16 0606 11/05/16 0602  WBC 9.3  < > 9.1 8.6 7.6 9.7 11.4*  NEUTROABS 6.2  --   --   --   --   --   --   HGB 8.7*  < > 7.6* 8.0* 7.9* 8.0* 8.9*  HCT 26.4*  < > 23.7* 23.4* 22.9* 23.6* 26.2*  MCV 89.8  < > 90.5 88.6 89.8 90.1 89.7  PLT 163  < > 161 107* 97* 103* 123*   < > = values in this interval not displayed. Blood Culture    Component Value Date/Time   SDES BLOOD RIGHT HAND 10/30/2016 2103   SPECREQUEST BOTTLES DRAWN AEROBIC AND ANAEROBIC 5CC 10/30/2016 2103   CULT NO GROWTH 5 DAYS 10/30/2016 2103   REPTSTATUS 11/04/2016 FINAL 10/30/2016 2103    Cardiac Enzymes:  Recent Labs Lab 10/31/16 1436 11/02/16 0215 11/02/16 0525 11/02/16 1336 11/02/16 1601  CKTOTAL  --   --  138  --   --   CKMB  --   --  12.2*  --   --   TROPONINI 0.08* 4.20* 3.83* 3.61* 3.22*   CBG:  Recent Labs Lab 11/03/16 1613 11/03/16 2147 11/04/16 1628 11/04/16 2127 11/05/16 0727  GLUCAP 298* 223* 207* 242* 176*   I Studies/Results: US Abdomen Limited Ruq  Result Date: 11/05/2016 CLINICAL DATA:  Elevated liver enzymes.  Renal failure. EXAM: US ABDOMEN  LIMITED - RIGHT UPPER QUADRANT COMPARISON:  Abdominal CT January 01, 2005 FINDINGS: Gallbladder: Within the gallbladder, there are multiple echogenic foci which move and shadow consistent with cholelithiasis. Largest gallstone measures 4 mm in length. There is also sludge in the gallbladder. The gallbladder wall is mildly thickened without pericholecystic fluid evident. No sonographic Murphy sign noted by sonographer. Common bile duct: Diameter: 4 mm. No intrahepatic or extrahepatic biliary duct dilatation. Liver: No focal lesion identified. Within normal limits in parenchymal echogenicity. There is a small amount of ascites. There is a right pleural effusion. The right kidney is noted to be echogenic. IMPRESSION: Cholelithiasis and sludge in gallbladder. Gallbladder wall is mildly thickened. Gallbladder wall thickening may be seen with ascites. The possibility of early cholecystitis must be of concern, however. Given this circumstance, it may be reasonable to correlate with nuclear medicine hepatobiliary imaging study to assess for patency of the cystic duct. Note that there is no pericholecystic fluid currently. There is mild  ascites as well as a right pleural effusion. Echogenic right kidney, a finding most likely indicative of underlying medical renal disease. Electronically Signed   By: Lowella Grip III M.D.   On: 11/05/2016 07:30   Medications:  . aspirin  81 mg Oral Daily  . citalopram  20 mg Oral Daily  . clopidogrel  75 mg Oral Daily  . [START ON 11/07/2016] darbepoetin (ARANESP) injection - DIALYSIS  200 mcg Intravenous Q Sat-HD  . ezetimibe  10 mg Oral Daily  . feeding supplement (PRO-STAT SUGAR FREE 64)  30 mL Oral BID  . ferric gluconate (FERRLECIT/NULECIT) IV  125 mg Intravenous Q M,W,F-HD  . heparin  5,000 Units Subcutaneous Q8H  . insulin aspart  0-5 Units Subcutaneous QHS  . insulin aspart  0-9 Units Subcutaneous TID WC  . metoprolol succinate  12.5 mg Oral QHS  . multivitamin  1 tablet Oral QHS  . sodium chloride flush  3 mL Intravenous Q12H     Assessment/Plan: 1. Weakness/SOB: Admitted 10/30/16 with signs of uremia. Had been on HD at [facility] but regained kidney function adequate to stop HD. Has been followed in office per Dr. Mercy Moore. Has now resumed HD.  2. CAD/NSTEMI: LHC-patent LIMA to LAD, occluded SVG to RCA with extensive collateral circ. NO PTCI. EF 50% per LHC. Treat medically. Cards following. Cont DAPT.  2. ESRD -Has been clipped to [facility]. MWF 2nd shift. Next HD tomorrow. K+ 3.6 3. Anemia - HGB 8.9. Last ESA dose Aranesp 60 mcg IV 11/01/16. Dose has been increased, due this week.  4. 2HPTH - no chg 5. HTN/volume -on Metoprolol succ 12.5 mg daily. Last HD 11/04/16 Pre wt 62.9 Net UF 1 liter post wt 59.6. BP stable during tx. 6. Nutrition - Albumin 2.8. Renal Carb mod diet/prostat/renal vit.  7. DM per primary   Rita H. Brown NP-C 11/05/2016, 11:46 AM  [facility] [phone]  Pt seen, examined and agree w A/P as above.  Kelly Splinter MD [employer] pager [phone]   11/05/2016, 3:00 PM

## 2016-11-06 LAB — HEPATITIS PANEL, ACUTE
HCV Ab: 0.4 s/co ratio (ref 0.0–0.9)
HEP B C IGM: NEGATIVE
HEP B S AG: NEGATIVE
Hep A IgM: NEGATIVE

## 2016-11-18 ENCOUNTER — Encounter (HOSPITAL_COMMUNITY): Payer: BLUE CROSS/BLUE SHIELD

## 2016-11-19 ENCOUNTER — Telehealth (HOSPITAL_COMMUNITY): Payer: Self-pay | Admitting: *Deleted

## 2016-11-19 NOTE — Telephone Encounter (Signed)
[facility] faxed in forms requesting medical records today.  Requested medical records faxed today to [phone].  Original request will be scanned to patient's electronic medical record.

## 2016-11-23 NOTE — Progress Notes (Signed)
OT Progress note addendum for 11-Nov-2016. Entering G-Codes   November 11, 2016 1400  OT Time Calculation  OT Start Time (ACUTE ONLY) 1432  OT Stop Time (ACUTE ONLY) 1451  OT Time Calculation (min) 19 min  OT G-codes **NOT FOR INPATIENT CLASS**  Functional Assessment Tool Used Clinical judgement  Functional Limitation Self care  Self Care Current Status (F7588) CJ  Self Care Goal Status (T2549) CI  Self Care Discharge Status (I2641) CI  OT General Charges  $OT Visit 1 Procedure  OT Treatments  $Self Care/Home Management  8-22 mins    [place], OTR/L [phone]

## 2016-11-23 NOTE — Progress Notes (Signed)
Physical Therapy NOTE  These G-codes are associated with evaluations on Nov 05, 2016.    2016/11/05 1430  PT Visit Information  Last PT Received On 05-Nov-2016  PT G-Codes **NOT FOR INPATIENT CLASS**  Functional Assessment Tool Used Clinical judgement  Functional Limitation Mobility: Walking and moving around  Mobility: Walking and Moving Around Current Status (Y2482) CJ  Mobility: Walking and Moving Around Goal Status (N0037) CI    Kittie Plater, PT, DPT Pager #: [phone] Office #: [phone]

## 2016-12-09 DIAGNOSIS — H2511 Age-related nuclear cataract, right eye: Secondary | ICD-10-CM | POA: Insufficient documentation

## 2016-12-15 ENCOUNTER — Emergency Department (HOSPITAL_COMMUNITY): Payer: Medicare Other

## 2016-12-15 ENCOUNTER — Inpatient Hospital Stay (HOSPITAL_COMMUNITY)
Admission: EM | Admit: 2016-12-15 | Discharge: 2016-12-17 | DRG: 304 | Disposition: A | Discharge status: Home / Self Care | Payer: Medicare Other | Attending: Internal Medicine | Admitting: Internal Medicine

## 2016-12-15 ENCOUNTER — Encounter (HOSPITAL_COMMUNITY): Payer: Self-pay | Admitting: *Deleted

## 2016-12-15 DIAGNOSIS — I132 Hypertensive heart and chronic kidney disease with heart failure and with stage 5 chronic kidney disease, or end stage renal disease: Secondary | ICD-10-CM | POA: Diagnosis present

## 2016-12-15 DIAGNOSIS — Z794 Long term (current) use of insulin: Secondary | ICD-10-CM

## 2016-12-15 DIAGNOSIS — I252 Old myocardial infarction: Secondary | ICD-10-CM

## 2016-12-15 DIAGNOSIS — I248 Other forms of acute ischemic heart disease: Secondary | ICD-10-CM | POA: Diagnosis present

## 2016-12-15 DIAGNOSIS — Z888 Allergy status to other drugs, medicaments and biological substances status: Secondary | ICD-10-CM

## 2016-12-15 DIAGNOSIS — Z992 Dependence on renal dialysis: Secondary | ICD-10-CM | POA: Diagnosis present

## 2016-12-15 DIAGNOSIS — R079 Chest pain, unspecified: Secondary | ICD-10-CM | POA: Diagnosis present

## 2016-12-15 DIAGNOSIS — R791 Abnormal coagulation profile: Secondary | ICD-10-CM | POA: Diagnosis present

## 2016-12-15 DIAGNOSIS — R0602 Shortness of breath: Secondary | ICD-10-CM

## 2016-12-15 DIAGNOSIS — R4182 Altered mental status, unspecified: Secondary | ICD-10-CM | POA: Diagnosis present

## 2016-12-15 DIAGNOSIS — I25118 Atherosclerotic heart disease of native coronary artery with other forms of angina pectoris: Secondary | ICD-10-CM | POA: Diagnosis present

## 2016-12-15 DIAGNOSIS — D649 Anemia, unspecified: Secondary | ICD-10-CM | POA: Diagnosis present

## 2016-12-15 DIAGNOSIS — Z9641 Presence of insulin pump (external) (internal): Secondary | ICD-10-CM | POA: Diagnosis present

## 2016-12-15 DIAGNOSIS — Z7982 Long term (current) use of aspirin: Secondary | ICD-10-CM

## 2016-12-15 DIAGNOSIS — F329 Major depressive disorder, single episode, unspecified: Secondary | ICD-10-CM | POA: Diagnosis present

## 2016-12-15 DIAGNOSIS — N2581 Secondary hyperparathyroidism of renal origin: Secondary | ICD-10-CM | POA: Diagnosis present

## 2016-12-15 DIAGNOSIS — E118 Type 2 diabetes mellitus with unspecified complications: Secondary | ICD-10-CM | POA: Diagnosis present

## 2016-12-15 DIAGNOSIS — Z8249 Family history of ischemic heart disease and other diseases of the circulatory system: Secondary | ICD-10-CM

## 2016-12-15 DIAGNOSIS — E785 Hyperlipidemia, unspecified: Secondary | ICD-10-CM | POA: Diagnosis present

## 2016-12-15 DIAGNOSIS — I480 Paroxysmal atrial fibrillation: Secondary | ICD-10-CM | POA: Diagnosis present

## 2016-12-15 DIAGNOSIS — I2510 Atherosclerotic heart disease of native coronary artery without angina pectoris: Secondary | ICD-10-CM | POA: Diagnosis present

## 2016-12-15 DIAGNOSIS — M216X1 Other acquired deformities of right foot: Secondary | ICD-10-CM | POA: Diagnosis present

## 2016-12-15 DIAGNOSIS — I160 Hypertensive urgency: Secondary | ICD-10-CM | POA: Diagnosis present

## 2016-12-15 DIAGNOSIS — Z7902 Long term (current) use of antithrombotics/antiplatelets: Secondary | ICD-10-CM

## 2016-12-15 DIAGNOSIS — Z951 Presence of aortocoronary bypass graft: Secondary | ICD-10-CM

## 2016-12-15 DIAGNOSIS — R109 Unspecified abdominal pain: Secondary | ICD-10-CM | POA: Diagnosis present

## 2016-12-15 DIAGNOSIS — R0682 Tachypnea, not elsewhere classified: Secondary | ICD-10-CM

## 2016-12-15 DIAGNOSIS — N186 End stage renal disease: Secondary | ICD-10-CM | POA: Diagnosis present

## 2016-12-15 DIAGNOSIS — Z87891 Personal history of nicotine dependence: Secondary | ICD-10-CM

## 2016-12-15 DIAGNOSIS — Z833 Family history of diabetes mellitus: Secondary | ICD-10-CM

## 2016-12-15 DIAGNOSIS — Z79899 Other long term (current) drug therapy: Secondary | ICD-10-CM

## 2016-12-15 DIAGNOSIS — Z800 Family history of malignant neoplasm of digestive organs: Secondary | ICD-10-CM

## 2016-12-15 DIAGNOSIS — D696 Thrombocytopenia, unspecified: Secondary | ICD-10-CM | POA: Diagnosis present

## 2016-12-15 DIAGNOSIS — I255 Ischemic cardiomyopathy: Secondary | ICD-10-CM | POA: Diagnosis present

## 2016-12-15 DIAGNOSIS — M216X2 Other acquired deformities of left foot: Secondary | ICD-10-CM | POA: Diagnosis present

## 2016-12-15 DIAGNOSIS — R400 Somnolence: Secondary | ICD-10-CM

## 2016-12-15 DIAGNOSIS — I5022 Chronic systolic (congestive) heart failure: Secondary | ICD-10-CM | POA: Diagnosis present

## 2016-12-15 DIAGNOSIS — E1122 Type 2 diabetes mellitus with diabetic chronic kidney disease: Secondary | ICD-10-CM | POA: Diagnosis present

## 2016-12-15 DIAGNOSIS — E1151 Type 2 diabetes mellitus with diabetic peripheral angiopathy without gangrene: Secondary | ICD-10-CM | POA: Diagnosis present

## 2016-12-15 DIAGNOSIS — E8889 Other specified metabolic disorders: Secondary | ICD-10-CM | POA: Diagnosis present

## 2016-12-15 LAB — I-STAT CHEM 8, ED
BUN: 33 mg/dL — ABNORMAL HIGH (ref 6–20)
CALCIUM ION: 1.02 mmol/L — AB (ref 1.15–1.40)
CHLORIDE: 100 mmol/L — AB (ref 101–111)
Creatinine, Ser: 5.3 mg/dL — ABNORMAL HIGH (ref 0.61–1.24)
Glucose, Bld: 257 mg/dL — ABNORMAL HIGH (ref 65–99)
HCT: 36 % — ABNORMAL LOW (ref 39.0–52.0)
Hemoglobin: 12.2 g/dL — ABNORMAL LOW (ref 13.0–17.0)
Potassium: 4.9 mmol/L (ref 3.5–5.1)
SODIUM: 137 mmol/L (ref 135–145)
TCO2: 27 mmol/L (ref 0–100)

## 2016-12-15 LAB — COMPREHENSIVE METABOLIC PANEL
ALBUMIN: 3.4 g/dL — AB (ref 3.5–5.0)
ALT: 23 U/L (ref 17–63)
ANION GAP: 12 (ref 5–15)
AST: 27 U/L (ref 15–41)
Alkaline Phosphatase: 92 U/L (ref 38–126)
BILIRUBIN TOTAL: 1 mg/dL (ref 0.3–1.2)
BUN: 29 mg/dL — ABNORMAL HIGH (ref 6–20)
CO2: 24 mmol/L (ref 22–32)
Calcium: 8.6 mg/dL — ABNORMAL LOW (ref 8.9–10.3)
Chloride: 100 mmol/L — ABNORMAL LOW (ref 101–111)
Creatinine, Ser: 4.88 mg/dL — ABNORMAL HIGH (ref 0.61–1.24)
GFR calc Af Amer: 14 mL/min — ABNORMAL LOW (ref >60)
GFR calc non Af Amer: 12 mL/min — ABNORMAL LOW (ref >60)
GLUCOSE: 250 mg/dL — AB (ref 65–99)
POTASSIUM: 5 mmol/L (ref 3.5–5.1)
SODIUM: 136 mmol/L (ref 135–145)
TOTAL PROTEIN: 6.6 g/dL (ref 6.5–8.1)

## 2016-12-15 LAB — CBC WITH DIFFERENTIAL/PLATELET
BASOS PCT: 0 %
Basophils Absolute: 0 10*3/uL (ref 0.0–0.1)
EOS ABS: 0.3 10*3/uL (ref 0.0–0.7)
EOS PCT: 5 %
HEMATOCRIT: 36 % — AB (ref 39.0–52.0)
Hemoglobin: 11.5 g/dL — ABNORMAL LOW (ref 13.0–17.0)
Lymphocytes Relative: 20 %
Lymphs Abs: 1.4 10*3/uL (ref 0.7–4.0)
MCH: 31.3 pg (ref 26.0–34.0)
MCHC: 31.9 g/dL (ref 30.0–36.0)
MCV: 98.1 fL (ref 78.0–100.0)
MONO ABS: 0.7 10*3/uL (ref 0.1–1.0)
MONOS PCT: 11 %
Neutro Abs: 4.4 10*3/uL (ref 1.7–7.7)
Neutrophils Relative %: 64 %
Platelets: 140 10*3/uL — ABNORMAL LOW (ref 150–400)
RBC: 3.67 MIL/uL — ABNORMAL LOW (ref 4.22–5.81)
RDW: 15.2 % (ref 11.5–15.5)
WBC: 6.9 10*3/uL (ref 4.0–10.5)

## 2016-12-15 LAB — I-STAT TROPONIN, ED: TROPONIN I, POC: 0 ng/mL (ref 0.00–0.08)

## 2016-12-15 LAB — I-STAT VENOUS BLOOD GAS, ED
ACID-BASE EXCESS: 3 mmol/L — AB (ref 0.0–2.0)
BICARBONATE: 27.2 mmol/L (ref 20.0–28.0)
O2 Saturation: 89 %
TCO2: 28 mmol/L (ref 0–100)
pCO2, Ven: 37.2 mmHg — ABNORMAL LOW (ref 44.0–60.0)
pH, Ven: 7.472 — ABNORMAL HIGH (ref 7.250–7.430)
pO2, Ven: 53 mmHg — ABNORMAL HIGH (ref 32.0–45.0)

## 2016-12-15 LAB — MAGNESIUM: Magnesium: 2.3 mg/dL (ref 1.7–2.4)

## 2016-12-15 LAB — AMMONIA: AMMONIA: 25 umol/L (ref 9–35)

## 2016-12-15 LAB — CBG MONITORING, ED: GLUCOSE-CAPILLARY: 234 mg/dL — AB (ref 65–99)

## 2016-12-15 LAB — I-STAT CG4 LACTIC ACID, ED: Lactic Acid, Venous: 1.54 mmol/L (ref 0.5–1.9)

## 2016-12-15 MED ORDER — SODIUM CHLORIDE 0.9 % IV BOLUS (SEPSIS)
500.0000 mL | Freq: Once | INTRAVENOUS | Status: AC
  Administered 2016-12-15: 500 mL via INTRAVENOUS

## 2016-12-15 NOTE — ED Triage Notes (Signed)
Pt to Ed by [facility] c/o altered mental status and sob. Pt speaks [place]. Wife at bedside. EMS called out for sob, pt is a dialysis pt, last treatment was Monday. Pt noted to be hypertensive, 186/80, cbg 343, insulin pump noted to LLQ (removed per Dr.Schlossman.) Pt able to follow some commands, tachynpeic. Hx of CABG in September.

## 2016-12-15 NOTE — ED Notes (Signed)
Pt taken to CT.

## 2016-12-16 ENCOUNTER — Inpatient Hospital Stay (HOSPITAL_COMMUNITY): Payer: Medicare Other

## 2016-12-16 ENCOUNTER — Encounter (HOSPITAL_COMMUNITY): Payer: Self-pay | Admitting: General Practice

## 2016-12-16 DIAGNOSIS — E1122 Type 2 diabetes mellitus with diabetic chronic kidney disease: Secondary | ICD-10-CM | POA: Diagnosis present

## 2016-12-16 DIAGNOSIS — Z951 Presence of aortocoronary bypass graft: Secondary | ICD-10-CM

## 2016-12-16 DIAGNOSIS — D696 Thrombocytopenia, unspecified: Secondary | ICD-10-CM | POA: Diagnosis present

## 2016-12-16 DIAGNOSIS — N186 End stage renal disease: Secondary | ICD-10-CM

## 2016-12-16 DIAGNOSIS — Z888 Allergy status to other drugs, medicaments and biological substances status: Secondary | ICD-10-CM | POA: Diagnosis not present

## 2016-12-16 DIAGNOSIS — R109 Unspecified abdominal pain: Secondary | ICD-10-CM | POA: Diagnosis present

## 2016-12-16 DIAGNOSIS — D649 Anemia, unspecified: Secondary | ICD-10-CM | POA: Diagnosis present

## 2016-12-16 DIAGNOSIS — R079 Chest pain, unspecified: Secondary | ICD-10-CM | POA: Diagnosis not present

## 2016-12-16 DIAGNOSIS — I132 Hypertensive heart and chronic kidney disease with heart failure and with stage 5 chronic kidney disease, or end stage renal disease: Secondary | ICD-10-CM | POA: Diagnosis present

## 2016-12-16 DIAGNOSIS — Z794 Long term (current) use of insulin: Secondary | ICD-10-CM | POA: Diagnosis not present

## 2016-12-16 DIAGNOSIS — N2581 Secondary hyperparathyroidism of renal origin: Secondary | ICD-10-CM | POA: Diagnosis present

## 2016-12-16 DIAGNOSIS — E785 Hyperlipidemia, unspecified: Secondary | ICD-10-CM | POA: Diagnosis present

## 2016-12-16 DIAGNOSIS — I248 Other forms of acute ischemic heart disease: Secondary | ICD-10-CM | POA: Diagnosis present

## 2016-12-16 DIAGNOSIS — I5022 Chronic systolic (congestive) heart failure: Secondary | ICD-10-CM

## 2016-12-16 DIAGNOSIS — F329 Major depressive disorder, single episode, unspecified: Secondary | ICD-10-CM | POA: Diagnosis present

## 2016-12-16 DIAGNOSIS — R0602 Shortness of breath: Secondary | ICD-10-CM

## 2016-12-16 DIAGNOSIS — I2510 Atherosclerotic heart disease of native coronary artery without angina pectoris: Secondary | ICD-10-CM | POA: Diagnosis present

## 2016-12-16 DIAGNOSIS — Z992 Dependence on renal dialysis: Secondary | ICD-10-CM | POA: Diagnosis not present

## 2016-12-16 DIAGNOSIS — I160 Hypertensive urgency: Secondary | ICD-10-CM | POA: Diagnosis present

## 2016-12-16 DIAGNOSIS — Z9641 Presence of insulin pump (external) (internal): Secondary | ICD-10-CM | POA: Diagnosis present

## 2016-12-16 LAB — BASIC METABOLIC PANEL
ANION GAP: 12 (ref 5–15)
BUN: 34 mg/dL — AB (ref 6–20)
CHLORIDE: 104 mmol/L (ref 101–111)
CO2: 21 mmol/L — ABNORMAL LOW (ref 22–32)
Calcium: 8.8 mg/dL — ABNORMAL LOW (ref 8.9–10.3)
Creatinine, Ser: 4.99 mg/dL — ABNORMAL HIGH (ref 0.61–1.24)
GFR calc Af Amer: 13 mL/min — ABNORMAL LOW (ref >60)
GFR calc non Af Amer: 12 mL/min — ABNORMAL LOW (ref >60)
GLUCOSE: 185 mg/dL — AB (ref 65–99)
POTASSIUM: 4.6 mmol/L (ref 3.5–5.1)
Sodium: 137 mmol/L (ref 135–145)

## 2016-12-16 LAB — GLUCOSE, CAPILLARY
GLUCOSE-CAPILLARY: 111 mg/dL — AB (ref 65–99)
Glucose-Capillary: 168 mg/dL — ABNORMAL HIGH (ref 65–99)
Glucose-Capillary: 159 mg/dL — ABNORMAL HIGH (ref 65–99)

## 2016-12-16 LAB — CBG MONITORING, ED: Glucose-Capillary: 178 mg/dL — ABNORMAL HIGH (ref 65–99)

## 2016-12-16 LAB — BRAIN NATRIURETIC PEPTIDE: B NATRIURETIC PEPTIDE 5: 881.1 pg/mL — AB (ref 0.0–100.0)

## 2016-12-16 LAB — TROPONIN I
Troponin I: <0.03 ng/mL (ref <0.03)
Troponin I: 0.03 ng/mL (ref <0.03)
Troponin I: 0.03 ng/mL (ref <0.03)

## 2016-12-16 LAB — CBC
HEMATOCRIT: 34.4 % — AB (ref 39.0–52.0)
HEMOGLOBIN: 11 g/dL — AB (ref 13.0–17.0)
MCH: 31.4 pg (ref 26.0–34.0)
MCHC: 32 g/dL (ref 30.0–36.0)
MCV: 98.3 fL (ref 78.0–100.0)
Platelets: 139 10*3/uL — ABNORMAL LOW (ref 150–400)
RBC: 3.5 MIL/uL — ABNORMAL LOW (ref 4.22–5.81)
RDW: 14.9 % (ref 11.5–15.5)
WBC: 7.9 10*3/uL (ref 4.0–10.5)

## 2016-12-16 LAB — ECHOCARDIOGRAM COMPLETE
HEIGHTINCHES: 64 in
Weight: 2248 oz

## 2016-12-16 LAB — D-DIMER, QUANTITATIVE: D-Dimer, Quant: 0.63 ug/mL-FEU — ABNORMAL HIGH (ref 0.00–0.50)

## 2016-12-16 LAB — HEPARIN LEVEL (UNFRACTIONATED): Heparin Unfractionated: 0.34 IU/mL (ref 0.30–0.70)

## 2016-12-16 MED ORDER — SODIUM CHLORIDE 0.9% FLUSH
3.0000 mL | Freq: Two times a day (BID) | INTRAVENOUS | Status: DC
  Administered 2016-12-16 – 2016-12-17 (×3): 3 mL via INTRAVENOUS

## 2016-12-16 MED ORDER — TECHNETIUM TC 99M DIETHYLENETRIAME-PENTAACETIC ACID: 31.1000 | Freq: Once | INTRAVENOUS | Status: DC | PRN

## 2016-12-16 MED ORDER — HEPARIN SODIUM (PORCINE) 5000 UNIT/ML IJ SOLN: 5000.0000 [IU] | Freq: Three times a day (TID) | INTRAMUSCULAR | Status: DC

## 2016-12-16 MED ORDER — MORPHINE SULFATE (PF) 4 MG/ML IV SOLN: 2.0000 mg | INTRAVENOUS | Status: DC | PRN

## 2016-12-16 MED ORDER — TECHNETIUM TO 99M ALBUMIN AGGREGATED
4.0000 | Freq: Once | INTRAVENOUS | Status: AC | PRN
  Administered 2016-12-16: 4 via INTRAVENOUS

## 2016-12-16 MED ORDER — HEPARIN BOLUS VIA INFUSION
4000.0000 [IU] | Freq: Once | INTRAVENOUS | Status: AC
  Administered 2016-12-16: 4000 [IU] via INTRAVENOUS
  Filled 2016-12-16: qty 4000

## 2016-12-16 MED ORDER — ONDANSETRON HCL 4 MG PO TABS: 4.0000 mg | ORAL_TABLET | Freq: Four times a day (QID) | ORAL | Status: DC | PRN

## 2016-12-16 MED ORDER — SODIUM CHLORIDE 0.9 % IV SOLN: 62.5000 mg | INTRAVENOUS | Status: DC

## 2016-12-16 MED ORDER — EZETIMIBE 10 MG PO TABS
10.0000 mg | ORAL_TABLET | Freq: Every day | ORAL | Status: DC
  Administered 2016-12-16 – 2016-12-17 (×2): 10 mg via ORAL
  Filled 2016-12-16 (×2): qty 1

## 2016-12-16 MED ORDER — VITAMIN C 500 MG PO TABS
1000.0000 mg | ORAL_TABLET | Freq: Every day | ORAL | Status: DC
  Administered 2016-12-17: 1000 mg via ORAL
  Filled 2016-12-16: qty 2

## 2016-12-16 MED ORDER — ALTEPLASE 2 MG IJ SOLR: 2.0000 mg | Freq: Once | INTRAMUSCULAR | Status: DC | PRN

## 2016-12-16 MED ORDER — PREDNISOLONE ACETATE 1 % OP SUSP
1.0000 [drp] | Freq: Three times a day (TID) | OPHTHALMIC | Status: DC
  Administered 2016-12-16 – 2016-12-17 (×5): 1 [drp] via OPHTHALMIC
  Filled 2016-12-16: qty 1

## 2016-12-16 MED ORDER — DARBEPOETIN ALFA 60 MCG/0.3ML IJ SOSY: 60.0000 ug | PREFILLED_SYRINGE | INTRAMUSCULAR | Status: DC

## 2016-12-16 MED ORDER — ATORVASTATIN CALCIUM 80 MG PO TABS
80.0000 mg | ORAL_TABLET | Freq: Every day | ORAL | Status: DC
  Administered 2016-12-16: 80 mg via ORAL
  Filled 2016-12-16: qty 1

## 2016-12-16 MED ORDER — HEPARIN SODIUM (PORCINE) 1000 UNIT/ML DIALYSIS
20.0000 [IU]/kg | INTRAMUSCULAR | Status: DC | PRN
  Filled 2016-12-16: qty 2

## 2016-12-16 MED ORDER — LIDOCAINE HCL (PF) 1 % IJ SOLN: 5.0000 mL | INTRAMUSCULAR | Status: DC | PRN

## 2016-12-16 MED ORDER — HEPARIN SODIUM (PORCINE) 1000 UNIT/ML DIALYSIS
1000.0000 [IU] | INTRAMUSCULAR | Status: DC | PRN
  Filled 2016-12-16: qty 1

## 2016-12-16 MED ORDER — PENTAFLUOROPROP-TETRAFLUOROETH EX AERO: 1.0000 "application " | INHALATION_SPRAY | CUTANEOUS | Status: DC | PRN

## 2016-12-16 MED ORDER — ACETAMINOPHEN 325 MG PO TABS
650.0000 mg | ORAL_TABLET | Freq: Four times a day (QID) | ORAL | Status: DC | PRN
Start: 2016-12-16 — End: 2016-12-17

## 2016-12-16 MED ORDER — NA FERRIC GLUC CPLX IN SUCROSE 12.5 MG/ML IV SOLN: 125.0000 mg | INTRAVENOUS | Status: DC

## 2016-12-16 MED ORDER — LIDOCAINE-PRILOCAINE 2.5-2.5 % EX CREA
1.0000 "application " | TOPICAL_CREAM | CUTANEOUS | Status: DC | PRN
  Filled 2016-12-16: qty 5

## 2016-12-16 MED ORDER — CIPROFLOXACIN HCL 0.3 % OP SOLN
1.0000 [drp] | Freq: Four times a day (QID) | OPHTHALMIC | Status: DC
  Administered 2016-12-16 – 2016-12-17 (×3): 1 [drp] via OPHTHALMIC
  Filled 2016-12-16: qty 2.5

## 2016-12-16 MED ORDER — ZOLPIDEM TARTRATE 5 MG PO TABS: 5.0000 mg | ORAL_TABLET | Freq: Every evening | ORAL | Status: DC | PRN

## 2016-12-16 MED ORDER — HEPARIN (PORCINE) IN NACL 100-0.45 UNIT/ML-% IJ SOLN
1000.0000 [IU]/h | INTRAMUSCULAR | Status: DC
  Administered 2016-12-16: 1000 [IU]/h via INTRAVENOUS
  Filled 2016-12-16: qty 250

## 2016-12-16 MED ORDER — AMLODIPINE BESYLATE 10 MG PO TABS
10.0000 mg | ORAL_TABLET | Freq: Every day | ORAL | Status: DC
  Administered 2016-12-16: 10 mg via ORAL
  Filled 2016-12-16: qty 1

## 2016-12-16 MED ORDER — NITROGLYCERIN 0.4 MG SL SUBL: 0.4000 mg | SUBLINGUAL_TABLET | SUBLINGUAL | Status: DC | PRN

## 2016-12-16 MED ORDER — INSULIN GLARGINE 100 UNIT/ML ~~LOC~~ SOLN
5.0000 [IU] | Freq: Every day | SUBCUTANEOUS | Status: DC
  Administered 2016-12-16 – 2016-12-17 (×2): 5 [IU] via SUBCUTANEOUS
  Filled 2016-12-16 (×2): qty 0.05

## 2016-12-16 MED ORDER — ASPIRIN 81 MG PO CHEW
81.0000 mg | CHEWABLE_TABLET | Freq: Every day | ORAL | Status: DC
  Administered 2016-12-16 – 2016-12-17 (×2): 81 mg via ORAL
  Filled 2016-12-16 (×2): qty 1

## 2016-12-16 MED ORDER — METOPROLOL SUCCINATE ER 25 MG PO TB24
12.5000 mg | ORAL_TABLET | Freq: Every day | ORAL | Status: DC
  Administered 2016-12-16: 12.5 mg via ORAL
  Filled 2016-12-16: qty 1

## 2016-12-16 MED ORDER — ONDANSETRON HCL 4 MG/2ML IJ SOLN: 4.0000 mg | Freq: Four times a day (QID) | INTRAMUSCULAR | Status: DC | PRN

## 2016-12-16 MED ORDER — HYDRALAZINE HCL 50 MG PO TABS
75.0000 mg | ORAL_TABLET | Freq: Two times a day (BID) | ORAL | Status: DC
  Administered 2016-12-16: 75 mg via ORAL
  Filled 2016-12-16: qty 3

## 2016-12-16 MED ORDER — INSULIN ASPART 100 UNIT/ML ~~LOC~~ SOLN
0.0000 [IU] | Freq: Three times a day (TID) | SUBCUTANEOUS | Status: DC
  Administered 2016-12-16: 2 [IU] via SUBCUTANEOUS
  Administered 2016-12-17: 1 [IU] via SUBCUTANEOUS

## 2016-12-16 MED ORDER — SODIUM CHLORIDE 0.9 % IV SOLN: 100.0000 mL | INTRAVENOUS | Status: DC | PRN

## 2016-12-16 MED ORDER — CLOPIDOGREL BISULFATE 75 MG PO TABS
75.0000 mg | ORAL_TABLET | Freq: Every day | ORAL | Status: DC
  Administered 2016-12-16 – 2016-12-17 (×2): 75 mg via ORAL
  Filled 2016-12-16 (×2): qty 1

## 2016-12-16 MED ORDER — KETOROLAC TROMETHAMINE 0.5 % OP SOLN
1.0000 [drp] | Freq: Three times a day (TID) | OPHTHALMIC | Status: DC
  Administered 2016-12-16 – 2016-12-17 (×5): 1 [drp] via OPHTHALMIC
  Filled 2016-12-16: qty 3

## 2016-12-16 MED ORDER — ACETAMINOPHEN 650 MG RE SUPP: 650.0000 mg | Freq: Four times a day (QID) | RECTAL | Status: DC | PRN

## 2016-12-16 MED ORDER — HYDRALAZINE HCL 20 MG/ML IJ SOLN: 10.0000 mg | INTRAMUSCULAR | Status: DC | PRN

## 2016-12-16 MED ORDER — DARBEPOETIN ALFA 100 MCG/0.5ML IJ SOSY: 100.0000 ug | PREFILLED_SYRINGE | INTRAMUSCULAR | Status: DC

## 2016-12-16 MED ORDER — LABETALOL HCL 5 MG/ML IV SOLN: 10.0000 mg | INTRAVENOUS | Status: DC | PRN

## 2016-12-16 NOTE — Progress Notes (Signed)
Patient arrived to unit per bed.  Reviewed treatment plan and this RN agrees.  Report received from bedside RN, Ananias Pilgrim.  Consent obtained.  Patient A & O X 4. Lung sounds diminished to ausculation in all fields. No edema. Cardiac: NSR, HB.  Prepped LUAVF with alcohol and cannulated with two 16 gauge needles.  Pulsation of blood noted.  Flushed access well with saline per protocol.  Connected and secured lines and initiated tx at 1540.  UF goal of 3000 mL and net fluid removal of 2500 mL.  Will continue to monitor.  Orthostatic vitals obtained prior to initiation of HD treatment.

## 2016-12-16 NOTE — Progress Notes (Signed)
PROGRESS NOTE   Kenneth [name]  KDT:[id number]    DOB: 06/13/1956    DOA: 12/15/2016  PCP: [name], MD.     I have briefly reviewed patients previous medical records in [facility].  Brief Narrative:  61 y.o. male with medical history significant of ESRD-HD (MWF), hypertension, hyperlipidemia, diabetes mellitus on insulin pump, CAD, s/p of CABG, sCHF (EF improved from 30-40 to recent 60-65%0, depression, who presents with shortness breath, chest pain and abdominal pain.Give history of lifting furniture at home recently. Initial blood pressure in ED 201/76. Transient chest pain and dyspnea resolved. Admitted for further evaluation and management. Cardiology consulted. Nephrology consulted for dialysis needs.   Assessment & Plan:   Principal Problem:   Hypertensive urgency Active Problems:   Chronic systolic CHF (congestive heart failure) (HCC)   S/P CABG x 3   ESRD (end stage renal disease) (HCC)   CAD (coronary artery disease)   DM (diabetes mellitus), type 2 with renal complications (HCC)   Chest pain   Abdominal pain   1. Hypertensive urgency: Initial blood pressure in ED 201/76. Blood pressures have improved but medications may need further titration. Currently on amlodipine 10 MG at bedtime, metoprolol 12.5 MG at bedtime. Added when necessary IV hydralazine. 2. Transient confusion/altered mental status:? Related to hypertensive urgency. CT head without acute findings. No focal neurological deficits. Resolved. Do not think that he had hypertensive encephalopathy or stroke. 3. Chest pain & dyspnea/CAD: Patient is status post CABG 3 on 06/05/16. Current presentation of chest pain was in the context of hypertensive urgency. EKG without acute changes and troponins negative. D-dimer elevated. VQ scan and lower extremity venous Dopplers negative. 2-D echo: LVEF 55-60 percent without wall motion abnormalities and grade 1 diastolic dysfunction. IV heparin was discontinued.  Cardiology input appreciated and recommend no additional workup. No clinical suspicion for pneumonia. 4. History of systolic CHF: EF has normalized. Not clinically volume overloaded. Volume management across dialysis. 5. ESRD on MWF HD: Discussed with nephrology and 4 dialysis today. 6. HLD: Continue Lipitor and Zetia. 7. Type II DM: A1c 10/31/16:7.7. Uses insulin pump at home.? Nonfunctional in the hospital. Treating with Lantus 5 units daily and SSI. Reasonable inpatient control. 8. Transient abdominal pain: Unclear etiology but has resolved. Monitor. 9. Anemia and thrombocytopenia: Stable and appear chronic.   DVT prophylaxis: IV heparin discontinued. Changed to DVT prophylactic dose heparin. Code Status: Full Family Communication: Discussed with spouse at bedside Disposition: DC home pending dialysis, improvement in blood pressure control and monitoring overnight.   Consultants:  Cardiology Nephrology   Procedures:  HD 2-D echo: Study Conclusions  - Left ventricle: The cavity size was normal. There was moderate   concentric hypertrophy. Systolic function was normal. The   estimated ejection fraction was in the range of 55% to 60%. Wall   motion was normal; there were no regional wall motion   abnormalities. Doppler parameters are consistent with abnormal   left ventricular relaxation (grade 1 diastolic dysfunction).   Doppler parameters are consistent with elevated ventricular   end-diastolic filling pressure. - Aortic valve: Trileaflet; mildly thickened, mildly calcified   leaflets. There was no regurgitation. - Aortic root: The aortic root was normal in size. - Mitral valve: There was mild regurgitation. - Left atrium: The atrium was mildly dilated. - Right ventricle: The cavity size was normal. Wall thickness was   normal. Systolic function was normal. - Right atrium: The atrium was normal in size. - Tricuspid valve: There  was no regurgitation. - Inferior vena cava: The  vessel was normal in size. - Pericardium, extracardiac: There was no pericardial effusion.  Impressions:  - No significant difference since the last study on 09/25/2016.  Antimicrobials:  None    Subjective: Seen this morning. Felt better. Had transient chest pain at 5 AM but none since and no dyspnea. As per spouse, no further confusion. History was obtained using video interpreter.  ROS: No headache or visual disturbances reported.  Objective:  Vitals:   12/16/16 1522 12/16/16 1523 12/16/16 1524 12/16/16 1600  BP: (!) 165/77 (!) 161/73 (!) 166/72 (!) 163/73  Pulse: 82 82 83 75  Resp: (!) 24     Temp: 97.9 F (36.6 C)     TempSrc:      SpO2:      Weight: 63.5 kg (139 lb 15.9 oz)     Height:        Examination:  General exam: Pleasant middle-aged male sitting up comfortably in bed. Respiratory system: Clear to auscultation. Respiratory effort normal. Midline sternotomy scar. Cardiovascular system: S1 & S2 heard, RRR. No JVD, murmurs, rubs, gallops or clicks. No pedal edema. Telemetry: Sinus rhythm. Gastrointestinal system: Abdomen is nondistended, soft and nontender. No organomegaly or masses felt. Normal bowel sounds heard. Central nervous system: Alert and oriented. No focal neurological deficits. Extremities: Symmetric 5 x 5 power. Skin: No rashes, lesions or ulcers Psychiatry: Judgement and insight appear normal. Mood & affect appropriate.     Data Reviewed: I have personally reviewed following labs and imaging studies  CBC:  Recent Labs Lab 12/15/16 2148 12/15/16 2201 12/16/16 0707  WBC 6.9  --  7.9  NEUTROABS 4.4  --   --   HGB 11.5* 12.2* 11.0*  HCT 36.0* 36.0* 34.4*  MCV 98.1  --  98.3  PLT 140*  --  948*   Basic Metabolic Panel:  Recent Labs Lab 12/15/16 2148 12/15/16 2201 12/16/16 0707  NA 136 137 137  K 5.0 4.9 4.6  CL 100* 100* 104  CO2 24  --  21*  GLUCOSE 250* 257* 185*  BUN 29* 33* 34*  CREATININE 4.88* 5.30* 4.99*  CALCIUM  8.6*  --  8.8*  MG 2.3  --   --    Liver Function Tests:  Recent Labs Lab 12/15/16 2148  AST 27  ALT 23  ALKPHOS 92  BILITOT 1.0  PROT 6.6  ALBUMIN 3.4*   Coagulation Profile: No results for input(s): INR, PROTIME in the last 168 hours. Cardiac Enzymes:  Recent Labs Lab 12/16/16 0150 12/16/16 0707 12/16/16 1320  TROPONINI <0.03 0.03* 0.03*   HbA1C: No results for input(s): HGBA1C in the last 72 hours. CBG:  Recent Labs Lab 12/15/16 2137 12/16/16 0229 12/16/16 0734 12/16/16 1208  GLUCAP 234* 178* 168* 111*    Recent Results (from the past 240 hour(s))  Blood culture (routine x 2)     Status: None (Preliminary result)   Collection Time: 12/15/16  9:52 PM  Result Value Ref Range Status   Specimen Description BLOOD RIGHT ARM  Final   Special Requests   Final    BOTTLES DRAWN AEROBIC AND ANAEROBIC Blood Culture adequate volume   Culture NO GROWTH < 12 HOURS  Final   Report Status PENDING  Incomplete         Radiology Studies: Ct Head Wo Contrast  Result Date: 12/15/2016 CLINICAL DATA:  Altered mental status EXAM: CT HEAD WITHOUT CONTRAST TECHNIQUE: Contiguous axial images were obtained from the  base of the skull through the vertex without intravenous contrast. COMPARISON:  10/30/2016 FINDINGS: Brain: No acute territorial infarction, hemorrhage or intracranial mass is visualized. Mild atrophy. The ventricles are nonenlarged. Vascular: No hyperdense vessels. Scattered calcifications at the carotid siphons. Skull: No fracture.  No suspicious bone lesion Sinuses/Orbits: Mucosal thickening within the sphenoid ethmoid and maxillary sinuses. No acute orbital abnormality. Right lens extraction. Other: None IMPRESSION: No definite CT evidence for acute intracranial abnormality. Electronically Signed   By: Donavan Foil M.D.   On: 12/15/2016 23:23   Nm Pulmonary Perf And Vent  Result Date: 12/16/2016 CLINICAL DATA:  Shortness of breath over the last week. Elevated D-dimer.  Renal failure. Dialysis. EXAM: NUCLEAR MEDICINE VENTILATION - PERFUSION LUNG SCAN TECHNIQUE: Ventilation images were obtained in multiple projections using inhaled aerosol Tc-13m DTPA. Perfusion images were obtained in multiple projections after intravenous injection of Tc-17m MAA. RADIOPHARMACEUTICALS:  4.1 mCi Technetium-13m DTPA aerosol inhalation and 31.1 mCi Technetium-34m MAA IV COMPARISON:  Radiography 12/15/2016 FINDINGS: Ventilation: No focal ventilation defect. Perfusion: No wedge shaped peripheral perfusion defects to suggest acute pulmonary embolism. IMPRESSION: Normal VQ scan. Electronically Signed   By: Nelson Chimes M.D.   On: 12/16/2016 13:32   Dg Chest Portable 1 View  Result Date: 12/15/2016 CLINICAL DATA:  Tachypnea EXAM: PORTABLE CHEST 1 VIEW COMPARISON:  10/30/2016 FINDINGS: The patient is status post CABG. There is borderline cardiomegaly which is stable in appearance. There is aortic atherosclerosis without aneurysm. Subtle retrocardiac opacity cannot exclude a left basilar pneumonia. No acute nor suspicious osseous abnormality. IMPRESSION: Status post CABG with aortic atherosclerosis and borderline cardiomegaly. Interval clearing of CHF. Subtle retrocardiac increased opacity cannot exclude a left basilar pneumonia. Correlate. Electronically Signed   By: Ashley Royalty M.D.   On: 12/15/2016 22:38        Scheduled Meds: . amLODipine  10 mg Oral QHS  . aspirin  81 mg Oral Daily  . atorvastatin  80 mg Oral q1800  . ciprofloxacin  1 drop Right Eye QID  . clopidogrel  75 mg Oral Daily  . [START ON 12/18/2016] darbepoetin (ARANESP) injection - DIALYSIS  60 mcg Intravenous Q Fri-HD  . ezetimibe  10 mg Oral Daily  . [START ON 12/18/2016] ferric gluconate (FERRLECIT/NULECIT) IV  62.5 mg Intravenous Q Fri-HD  . insulin aspart  0-9 Units Subcutaneous TID WC  . insulin glargine  5 Units Subcutaneous Daily  . ketorolac  1 drop Right Eye TID AC & HS  . metoprolol succinate  12.5 mg Oral QHS    . prednisoLONE acetate  1 drop Right Eye TID AC & HS  . sodium chloride flush  3 mL Intravenous Q12H  . vitamin C  1,000 mg Oral Daily   Continuous Infusions:   LOS: 0 days     Devorah Givhan, MD, FACP, FHM. [facility] Pager [phone]  If 7PM-7AM, please contact night-coverage [email or web address] Password [email or web address] 12/16/2016, 4:22 PM

## 2016-12-16 NOTE — H&P (Signed)
History and Physical    Kenneth [name] OZH:[id number] DOB: 1956/01/22 DOA: 12/15/2016  Referring MD/NP/PA:   PCP: [name], MD   Patient coming from:  The patient is coming from home.  At baseline, pt is independent for most of ADL. SNF  Assistant living facility   [facility].       Chief Complaint: Shortness of breath, chest pain, abdominal pain, confusion,  HPI: Kenneth [name] is a 61 y.o. male with medical history significant of ESRD-HD (MWF), hypertension, hyperlipidemia, diabetes mellitus on insulin pump, CAD, s/p of CABG, sCHF (EF improved from 30-40 to recent 60-65%0, depression, who presents with shortness breath, chest pain and abdominal pain.  Patient states that she started having shortness of breath at about 8 PM last night. He also has chest pain, which is located in the right upper chest, 8 out of 10 in severity, nonpleuritic, nonradiating. Patient denies fever, chills, cough. He also had mild abdominal pain yesterday afternoon. It is located in the central abdomen, 3 out of 10 in severity, nonradiating. No nausea, vomiting, diarrhea. Patient states that his abdominal pain has completely resolved currently. Per EDP, patient was confused initially, which has resolved completely when I saw patient in ED. Currently patient is alert and oriented 3. Patient states that his chest pain has completely resolved currently. His shortness breath has also significantly improved, currently only has minimal shortness of breath. Patient strongly denies unilateral weakness, numbness or tingling to extremities. No hearing loss or vision change. No tenderness in calf areas.  ED Course: pt was found to have elevated blood pressure 201/76, which improved to 173/76. Negative troponin, BNP 881, WBC 6.9, lactate 1.54, potassium 5.0, bicarbonate 24, creatinine 4.88, BUN 29, temperature normal, negative CT head for acute intracranial abnormalities. CXR showed subtle retrocardiac increased opacity. Pt  is admitted tele bed as inpt.  Review of Systems:   General: no fevers, chills, no changes in body weight, has fatigue HEENT: no blurry vision, hearing changes or sore throat Respiratory: has dyspnea, no oughing, wheezing CV: has chest pain, no palpitations GI: no nausea, vomiting, had abdominal pain, no diarrhea, constipation GU: no dysuria, burning on urination, increased urinary frequency, hematuria  Ext: no leg edema Neuro: no unilateral weakness, numbness, or tingling, no vision change or hearing loss Skin: no rash, no skin tear. MSK: No muscle spasm, no deformity, no limitation of range of movement in spin Heme: No easy bruising.  Travel history: No recent long distant travel.  Allergy:  Allergies  Allergen Reactions  . Hydralazine Rash    Pt has been tolerating this medication at home    Past Medical History:  Diagnosis Date  . Acute pulmonary edema ([place]) 05/31/2016  . Acute respiratory failure with hypoxemia ([place]) 05/31/2016  . AKI (acute kidney injury) ([place])   . CHF (congestive heart failure) ([place])   . 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 HCAP (healthcare-associated pneumonia)   . Ischemic rest pain of lower extremity ([place]) 02/06/2015  . 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 NSTEMI (non-ST elevated myocardial infarction) ([place])   . Pain in lower limb 02/06/2015  . Pain in the chest   . Pleural effusion   . 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 Shortness of breath     Past Surgical History:  Procedure Laterality Date  . AV FISTULA PLACEMENT Left 06/22/2016   Procedure: ARTERIOVENOUS (AV) FISTULA CREATION LEFT UPPER ARM;  Surgeon: Rosetta Posner, MD;  Location[facility]  OR;  Service: Vascular;  Laterality: Left;  . CARDIAC CATHETERIZATION N/A 05/31/2016   Procedure: Left Heart Cath and Coronary Angiography;  Surgeon: Jettie Booze, MD;  Location: [place] CV LAB;  Service: Cardiovascular;  Laterality: N/A;  . CARDIAC CATHETERIZATION  N/A 05/31/2016   Procedure: Right Heart Cath;  Surgeon: Jettie Booze, MD;  Location: [place] CV LAB;  Service: Cardiovascular;  Laterality: N/A;  . CARDIAC CATHETERIZATION N/A 05/31/2016   Procedure: IABP Insertion;  Surgeon: Jettie Booze, MD;  Location: [place] [facility] LAB;  Service: Cardiovascular;  Laterality: N/A;  . CORONARY ARTERY BYPASS GRAFT N/A 06/05/2016   Procedure: CORONARY ARTERY BYPASS GRAFTING (CABG) x3 LIMA to LAD -SVG to OM -SVG to RCA;  Surgeon: Ivin Poot, MD;  Location: [place];  Service: Open Heart Surgery;  Laterality: N/A;  . INSERTION OF DIALYSIS CATHETER N/A 06/05/2016   Procedure: INSERTION OF DIALYSIS/trialysis CATHETER;  Surgeon: Ivin Poot, MD;  Location: [place];  Service: Vascular;  Laterality: N/A;  . INSERTION OF DIALYSIS CATHETER Right 06/13/2016   Procedure: INSERTION OF DIALYSIS CATHETER RIGHT INTERNAL JUGULAR;  Surgeon: Conrad [place]. [place], MD;  Location: [place];  Service: Vascular;  Laterality: Right;  . INTRAOPERATIVE TRANSESOPHAGEAL ECHOCARDIOGRAM N/A 06/05/2016   Procedure: INTRAOPERATIVE TRANSESOPHAGEAL ECHOCARDIOGRAM;  Surgeon: Ivin Poot, MD;  Location: [place];  Service: Open Heart Surgery;  Laterality: N/A;  . LEFT HEART CATH AND CORS/GRAFTS ANGIOGRAPHY N/A 11/03/2016   Procedure: Left Heart Cath and Cors/Grafts Angiography;  Surgeon: Troy Sine, MD;  Location: [place] [facility] LAB;  Service: Cardiovascular;  Laterality: N/A;    Social History:  reports that he has quit smoking. He has never used smokeless tobacco. He reports that he does not drink alcohol or use drugs.  Family History:  Family History  Problem Relation Age of Onset  . CAD Father   . Colon cancer Father   . Diabetes Brother      Prior to Admission medications   Medication Sig Start Date End Date Taking? Authorizing Provider  albuterol (PROVENTIL HFA;VENTOLIN HFA) 108 (90 Base) MCG/ACT inhaler Inhale 2 puffs into the lungs every 6 (six) hours as needed for  wheezing.    Historical Provider, MD  aspirin 81 MG chewable tablet Chew 1 tablet (81 mg total) by mouth daily. 11/06/16   Costin Karlyne Greenspan, MD  atorvastatin (LIPITOR) 80 MG tablet Take 1 tablet (80 mg total) by mouth daily. 09/22/16 12/21/16  Larey Dresser, MD  citalopram (CELEXA) 20 MG tablet Take 1 tablet (20 mg total) by mouth daily. 06/23/16   Elgie Collard, PA-C  clopidogrel (PLAVIX) 75 MG tablet Take 1 tablet (75 mg total) by mouth daily. 11/06/16   Costin Karlyne Greenspan, MD  ezetimibe (ZETIA) 10 MG tablet Take 1 tablet (10 mg total) by mouth daily. 09/22/16 12/21/16  Larey Dresser, MD  hydrALAZINE (APRESOLINE) 50 MG tablet Take 1.5 tablets (75 mg total) by mouth 2 (two) times daily. 10/19/16   Larey Dresser, MD  Insulin Human (INSULIN PUMP) SOLN Inject into the skin continuous. Novolog    Historical Provider, MD  metoprolol succinate (TOPROL-XL) 25 MG 24 hr tablet Take 0.5 tablets (12.5 mg total) by mouth at bedtime. 11/05/16   [place], MD  Multiple Vitamin (MULTIVITAMIN WITH MINERALS) TABS tablet Take 1 tablet by mouth at bedtime.    Historical Provider, MD  promethazine-codeine (PHENERGAN WITH CODEINE) 6.25-10 MG/5ML syrup Take 5 mLs by mouth every 6 (six)  hours as needed for cough. 10/22/16   Historical Provider, MD    Physical Exam: Vitals:   12/16/16 0200 12/16/16 0215 12/16/16 0230 12/16/16 0328  BP: (!) 170/79 (!) 176/79 (!) 172/66 (!) 175/65  Pulse: 79 79 77 81  Resp:      Temp:    98.7 F (37.1 C)  TempSrc:    Oral  SpO2: 99% 99% 98% 98%  Weight:    63.7 kg (140 lb 8 oz)  Height:    5\' 4"  (1.626 m)   General: Not in acute distress HEENT:       Eyes: PERRL, EOMI, no scleral icterus.       ENT: No discharge from the ears and nose, no pharynx injection, no tonsillar enlargement.        Neck: No JVD, no bruit, no mass felt. Heme: No neck lymph node enlargement. Cardiac: S1/S2, RRR, No murmurs, No gallops or rubs. Respiratory:  No rales, wheezing, rhonchi or rubs. GI: Soft,  nondistended, nontender, no rebound pain, no organomegaly, BS present. GU: No hematuria Ext: No pitting leg edema bilaterally. 2+DP/PT pulse bilaterally. Has functioning AV fistula in the left arm Musculoskeletal: No joint deformities, No joint redness or warmth, no limitation of ROM in spin. Skin: No rashes.  Neuro: Alert, oriented X3, cranial nerves II-XII grossly intact, moves all extremities normally. Muscle strength 5/5 in all extremities, sensation to light touch intact. Brachial reflex 2+ bilaterally. Negative Babinski's sign. Normal finger to nose test. Psych: Patient is not psychotic, no suicidal or hemocidal ideation.  Labs on Admission: I have personally reviewed following labs and imaging studies  CBC:  Recent Labs Lab 12/15/16 2148 12/15/16 2201  WBC 6.9  --   NEUTROABS 4.4  --   HGB 11.5* 12.2*  HCT 36.0* 36.0*  MCV 98.1  --   PLT 140*  --    Basic Metabolic Panel:  Recent Labs Lab 12/15/16 2148 12/15/16 2201  NA 136 137  K 5.0 4.9  CL 100* 100*  CO2 24  --   GLUCOSE 250* 257*  BUN 29* 33*  CREATININE 4.88* 5.30*  CALCIUM 8.6*  --   MG 2.3  --    GFR: Estimated Creatinine Clearance: 12.6 mL/min (A) (by C-G formula based on SCr of 5.3 mg/dL (H)). Liver Function Tests:  Recent Labs Lab 12/15/16 2148  AST 27  ALT 23  ALKPHOS 92  BILITOT 1.0  PROT 6.6  ALBUMIN 3.4*   No results for input(s): LIPASE, AMYLASE in the last 168 hours.  Recent Labs Lab 12/15/16 2148  AMMONIA 25   Coagulation Profile: No results for input(s): INR, PROTIME in the last 168 hours. Cardiac Enzymes:  Recent Labs Lab 12/16/16 0150  TROPONINI <0.03   BNP (last 3 results) No results for input(s): PROBNP in the last 8760 hours. HbA1C: No results for input(s): HGBA1C in the last 72 hours. CBG:  Recent Labs Lab 12/15/16 2137 12/16/16 0229  GLUCAP 234* 178*   Lipid Profile: No results for input(s): CHOL, HDL, LDLCALC, TRIG, CHOLHDL, LDLDIRECT in the last 72  hours. Thyroid Function Tests: No results for input(s): TSH, T4TOTAL, FREET4, T3FREE, THYROIDAB in the last 72 hours. Anemia Panel: No results for input(s): VITAMINB12, FOLATE, FERRITIN, TIBC, IRON, RETICCTPCT in the last 72 hours. Urine analysis:    Component Value Date/Time   COLORURINE YELLOW 10/30/2016 2339   APPEARANCEUR CLEAR 10/30/2016 2339   LABSPEC 1.010 10/30/2016 2339   PHURINE 5.0 10/30/2016 2339   GLUCOSEU NEGATIVE 10/30/2016  [place] 10/30/2016 [place] 10/30/2016 2339   KETONESUR NEGATIVE 10/30/2016 2339   PROTEINUR 100 (A) 10/30/2016 2339   NITRITE NEGATIVE 10/30/2016 2339   LEUKOCYTESUR NEGATIVE 10/30/2016 2339   Sepsis Labs: @LABRCNTIP (procalcitonin:4,lacticidven:4) )No results found for this or any previous visit (from the past 240 hour(s)).   Radiological Exams on Admission: Ct Head Wo Contrast  Result Date: 12/15/2016 CLINICAL DATA:  Altered mental status EXAM: CT HEAD WITHOUT CONTRAST TECHNIQUE: Contiguous axial images were obtained from the base of the skull through the vertex without intravenous contrast. COMPARISON:  10/30/2016 FINDINGS: Brain: No acute territorial infarction, hemorrhage or intracranial mass is visualized. Mild atrophy. The ventricles are nonenlarged. Vascular: No hyperdense vessels. Scattered calcifications at the carotid siphons. Skull: No fracture.  No suspicious bone lesion Sinuses/Orbits: Mucosal thickening within the sphenoid ethmoid and maxillary sinuses. No acute orbital abnormality. Right lens extraction. Other: None IMPRESSION: No definite CT evidence for acute intracranial abnormality. Electronically Signed   By: Donavan Foil M.D.   On: 12/15/2016 23:23   Dg Chest Portable 1 View  Result Date: 12/15/2016 CLINICAL DATA:  Tachypnea EXAM: PORTABLE CHEST 1 VIEW COMPARISON:  10/30/2016 FINDINGS: The patient is status post CABG. There is borderline cardiomegaly which is stable in appearance. There is aortic  atherosclerosis without aneurysm. Subtle retrocardiac opacity cannot exclude a left basilar pneumonia. No acute nor suspicious osseous abnormality. IMPRESSION: Status post CABG with aortic atherosclerosis and borderline cardiomegaly. Interval clearing of CHF. Subtle retrocardiac increased opacity cannot exclude a left basilar pneumonia. Correlate. Electronically Signed   By: Ashley Royalty M.D.   On: 12/15/2016 22:38     EKG: Independently reviewed. Sinus rhythm, QTC 472, T-wave inversion in V5 to V6 and in lateral leads, which has no significant change compared with previous EKG.   Assessment/Plan Principal Problem:   Hypertensive urgency Active Problems:   Chronic systolic CHF (congestive heart failure) (HCC)   S/P CABG x 3   ESRD (end stage renal disease) (HCC)   CAD (coronary artery disease)   DM (diabetes mellitus), type 2 with renal complications (HCC)   Chest pain   Abdominal pain   Hypertensive urgency: he has elevated Bp 201/76, which improved to 173/76. Patient had altered mental status, which has completely resolved. Currently no focal neurological findings, less likely to have stroke. Patient could have had hypertensive encephalopathy.  -will admit to tele bed as inpt -IV hydralazine when necessary -Continue metoprolol, oral hydralazine -Frequent neuro checks. -If patient shows any signs of focal deficit or worsening mental status, will need to get MRI of the brain.  SOB: Most likely explanation is hypertensive urgency introduced flash of pulmonary edema, but pt has positive d-dimer 0.63, cannot completely rule out PE. Pt has history of ESRD, and has been on and off dialysis with reinitiation in February, he still makes some urine, will not do CTA. Chest x-ray showed  possible left basilar infiltration, but patient does not have fever or leukocytosis. Lactate is normal. Clinically patient does not have pneumonia. -will start IV heparin -get V/Q scan in AM -get LE venous  doppler to r/o PE.  CP and hx of CAD: s/p of CABG in September 2017, left heart catheterization 11/03/2016 with multiple lesions. His chest pain is most likely due to demand ischemia secondary to hypertensive urgency. His chest pain has completely resolved. - cycle CE q6 x3 and repeat EKG in the am  - Nitroglycerin, Morphine, and aspirin, plavix, lipitor. Zetia  - 2d  echo  Chronic systolic CHF (congestive heart failure) ([place]): 2d echo on 09/25/16 showed LVEF has improved from 35-40% to 60-65%. Pt does not have leg edema or JVD, does not seem to have acute CHF exacerbation, but the patient could have an episode of flash pulmonary edema secondary to hypertensive urgency. -need dialysis tomorrow  ESRD (end stage renal disease) (MWF):  potassium 5.0, bicarbonate 24, creatinine 4.88, -Left messages to renal box for dialysis tomorrow  DM-II: Last A1c 7.7 on 10/31/16, poorly controled. Patient is using insulin pump at home. Insulin pump is removed the ED physician. It cannot be put on due to lack of small part per his wife. -will start lantus 5 units daily -SSI  Abdominal pain:  Patient and the mild abdominal pain, no nausea, vomiting, diarrhea. His abdominal pain has completely resolved. Abdominal examination is benign. -Observe closely.   DVT ppx: SQ Heparin  Code Status: Full code Family Communication:  Yes, patient's wife   at bed side Disposition Plan:  Anticipate discharge back to previous home environment Consults called:  none Admission status:  Inpatient/tele     Date of Service 12/16/2016    Ivor Costa [facility] Pager [phone]  If 7PM-7AM, please contact night-coverage [email or web address] Password [email or web address] 12/16/2016, 4:30 AM

## 2016-12-16 NOTE — Progress Notes (Signed)
Dialysis treatment completed.  2500 mL ultrafiltrated and net fluid removal 2000 mL.    Patient status unchanged. Lung sounds diminished to ausculation in all fields. Generalized edema. Cardiac: NSR.  Disconnected lines and removed needles.  Pressure held for 10 minutes and band aid/gauze dressing applied.  Report given to bedside RN, Ananias Pilgrim.

## 2016-12-16 NOTE — Progress Notes (Signed)
*  PRELIMINARY RESULTS* Vascular Ultrasound Lower extremity venous duplex has been completed.  Preliminary findings: No evidence of DVT or baker's cyst.  Landry Mellow, RDMS, RVT  12/16/2016, 11:01 AM

## 2016-12-16 NOTE — Consult Note (Signed)
[facility] Renal Consultation Note    Indication for Consultation:  Management of ESRD/hemodialysis; anemia, hypertension/volume and secondary hyperparathyroidism  HPI: Kenneth [name] is a 61 y.o. male with ESRD presumed secondary to DM  (MWF AF) who presented from home on a non dialysis day with SOB and CP and abdominal pain.  PMHx also significant for CABG, depression. All symptoms have resolved. He is breathing easily supine without SOB, cough, CP. He denies fever, chills, N, V, diarrhea. BP was elevated on presentation.  His wife showed me his home BP meds which are hydralazine 50 bid and MTP succinate 0.5 of 25 mg q HS.  He is compliant with dialysis.  BP tend to run high pre HD but come down.  Evaluation in the ED yesterday showed BNP 881, WBC 6.9, K 5 CT neg for acute findings and CXR showed ? Left basilar PNA. WBC 6.9 on admission with normal diff. Hgb stable at 11.5  Past Medical History:  Diagnosis Date  . Acute pulmonary edema ([place]) 05/31/2016  . Acute respiratory failure with hypoxemia ([place]) 05/31/2016  . AKI (acute kidney injury) ([place])   . CHF (congestive heart failure) ([place])   . 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 HCAP (healthcare-associated pneumonia)   . Ischemic rest pain of lower extremity ([place]) 02/06/2015  . 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 NSTEMI (non-ST elevated myocardial infarction) ([place])   . Pain in lower limb 02/06/2015  . Pain in the chest   . Pleural effusion   . 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 Shortness of breath    Past Surgical History:  Procedure Laterality Date  . AV FISTULA PLACEMENT Left 06/22/2016   Procedure: ARTERIOVENOUS (AV) FISTULA CREATION LEFT UPPER ARM;  Surgeon: Rosetta Posner, MD;  Location: [place];  Service: Vascular;  Laterality: Left;  . CARDIAC CATHETERIZATION N/A 05/31/2016   Procedure: Left Heart Cath and Coronary Angiography;  Surgeon: Jettie Booze, MD;  Location: [facility];  Service: Cardiovascular;  Laterality: N/A;  . CARDIAC CATHETERIZATION N/A 05/31/2016   Procedure: Right Heart Cath;  Surgeon: Jettie Booze, MD;  Location: [place] [facility];  Service: Cardiovascular;  Laterality: N/A;  . CARDIAC CATHETERIZATION N/A 05/31/2016   Procedure: IABP Insertion;  Surgeon: Jettie Booze, MD;  Location: [place] [facility];  Service: Cardiovascular;  Laterality: N/A;  . CORONARY ARTERY BYPASS GRAFT N/A 06/05/2016   Procedure: CORONARY ARTERY BYPASS GRAFTING (CABG) x3 LIMA to LAD -SVG to OM -SVG to RCA;  Surgeon: Ivin Poot, MD;  Location: [place];  Service: Open Heart Surgery;  Laterality: N/A;  . INSERTION OF DIALYSIS CATHETER N/A 06/05/2016   Procedure: INSERTION OF DIALYSIS/trialysis CATHETER;  Surgeon: Ivin Poot, MD;  Location: [place];  Service: Vascular;  Laterality: N/A;  . INSERTION OF DIALYSIS CATHETER Right 06/13/2016   Procedure: INSERTION OF DIALYSIS CATHETER RIGHT INTERNAL JUGULAR;  Surgeon: Conrad [place], MD;  Location: [place];  Service: Vascular;  Laterality: Right;  . INTRAOPERATIVE TRANSESOPHAGEAL ECHOCARDIOGRAM N/A 06/05/2016   Procedure: INTRAOPERATIVE TRANSESOPHAGEAL ECHOCARDIOGRAM;  Surgeon: Ivin Poot, MD;  Location: [place];  Service: Open Heart Surgery;  Laterality: N/A;  . LEFT HEART CATH AND CORS/GRAFTS ANGIOGRAPHY N/A 11/03/2016   Procedure: Left Heart Cath and Cors/Grafts Angiography;  Surgeon: Troy Sine, MD;  Location: [place];  Service: Cardiovascular;  Laterality: N/A;   Family History  Problem Relation Age of Onset  .  CAD Father   . Colon cancer Father   . Diabetes Brother    Social History:  reports that he has quit smoking. He has never used smokeless tobacco. He reports that he does not drink alcohol or use drugs. Allergies  Allergen Reactions  . Hydralazine Rash    Pt has been tolerating this medication at home   Prior to Admission medications   Medication Sig Start Date End Date Taking?  Authorizing Provider  albuterol (PROVENTIL HFA;VENTOLIN HFA) 108 (90 Base) MCG/ACT inhaler Inhale 2 puffs into the lungs every 6 (six) hours as needed for wheezing.   Yes Historical Provider, MD  aspirin 81 MG chewable tablet Chew 1 tablet (81 mg total) by mouth daily. 11/06/16  Yes Costin Karlyne Greenspan, MD  atorvastatin (LIPITOR) 80 MG tablet Take 1 tablet (80 mg total) by mouth daily. 09/22/16 12/21/16 Yes Larey Dresser, MD  ciprofloxacin (CILOXAN) 0.3 % ophthalmic solution Place 1 drop into the right eye 4 (four) times daily.   Yes Historical Provider, MD  clopidogrel (PLAVIX) 75 MG tablet Take 1 tablet (75 mg total) by mouth daily. 11/06/16  Yes Costin Karlyne Greenspan, MD  ezetimibe (ZETIA) 10 MG tablet Take 1 tablet (10 mg total) by mouth daily. 09/22/16 12/21/16 Yes Larey Dresser, MD  hydrALAZINE (APRESOLINE) 50 MG tablet Take 1.5 tablets (75 mg total) by mouth 2 (two) times daily. Patient taking differently: Take 50 mg by mouth 2 (two) times daily.  10/19/16  Yes Larey Dresser, MD  Insulin Human (INSULIN PUMP) SOLN Inject into the skin continuous. Novolog   Yes Historical Provider, MD  ketorolac (ACULAR) 0.5 % ophthalmic solution Place 1 drop into the right eye 4 (four) times daily.   Yes Historical Provider, MD  metoprolol succinate (TOPROL-XL) 25 MG 24 hr tablet Take 0.5 tablets (12.5 mg total) by mouth at bedtime. 11/05/16  Yes Costin Karlyne Greenspan, MD  prednisoLONE acetate (PRED FORTE) 1 % ophthalmic suspension Place 1 drop into the right eye 4 (four) times daily.   Yes Historical Provider, MD  vitamin C (ASCORBIC ACID) 500 MG tablet Take 1,000 mg by mouth daily.   Yes Historical Provider, MD   Current Facility-Administered Medications  Medication Dose Route Frequency Provider Last Rate Last Dose  . acetaminophen (TYLENOL) tablet 650 mg  650 mg Oral Q6H PRN Ivor Costa, MD       Or  . acetaminophen (TYLENOL) suppository 650 mg  650 mg Rectal Q6H PRN Ivor Costa, MD      . aspirin chewable tablet 81 mg  81 mg  Oral Daily Ivor Costa, MD      . atorvastatin (LIPITOR) tablet 80 mg  80 mg Oral q1800 Ivor Costa, MD      . ciprofloxacin (CILOXAN) 0.3 % ophthalmic solution 1 drop  1 drop Right Eye QID Ivor Costa, MD      . clopidogrel (PLAVIX) tablet 75 mg  75 mg Oral Daily Ivor Costa, MD      . Derrill Memo ON 12/18/2016] Darbepoetin Alfa (ARANESP) injection 100 mcg  100 mcg Intravenous Q Fri-HD Alric Seton, PA-C      . ezetimibe (ZETIA) tablet 10 mg  10 mg Oral Daily Ivor Costa, MD      . Derrill Memo ON 12/18/2016] ferric gluconate (NULECIT) 125 mg in sodium chloride 0.9 % 100 mL IVPB  125 mg Intravenous Q Fri-HD Alric Seton, PA-C      . heparin ADULT infusion 100 units/mL (25000 units/261mL sodium chloride 0.45%)  1,000  Units/hr Intravenous Continuous Franky Macho, RPH 10 mL/hr at 12/16/16 0532 1,000 Units/hr at 12/16/16 0532  . hydrALAZINE (APRESOLINE) injection 10 mg  10 mg Intravenous Q2H PRN Ivor Costa, MD      . hydrALAZINE (APRESOLINE) tablet 75 mg  75 mg Oral BID Ivor Costa, MD   75 mg at 12/16/16 0212  . insulin aspart (novoLOG) injection 0-9 Units  0-9 Units Subcutaneous TID WC Ivor Costa, MD      . insulin glargine (LANTUS) injection 5 Units  5 Units Subcutaneous Daily Ivor Costa, MD      . ketorolac (ACULAR) 0.5 % ophthalmic solution 1 drop  1 drop Right Eye TID [facility] & HS Ivor Costa, MD   1 drop at 12/16/16 0535  . metoprolol succinate (TOPROL-XL) 24 hr tablet 12.5 mg  12.5 mg Oral QHS Ivor Costa, MD      . morphine 4 MG/ML injection 2 mg  2 mg Intravenous Q4H PRN Ivor Costa, MD      . nitroGLYCERIN (NITROSTAT) SL tablet 0.4 mg  0.4 mg Sublingual Q5 min PRN Ivor Costa, MD      . ondansetron [facility]) tablet 4 mg  4 mg Oral Q6H PRN Ivor Costa, MD       Or  . ondansetron [facility]) injection 4 mg  4 mg Intravenous Q6H PRN Ivor Costa, MD      . prednisoLONE acetate (PRED FORTE) 1 % ophthalmic suspension 1 drop  1 drop Right Eye TID [facility] & HS Ivor Costa, MD   1 drop at 12/16/16 0535  . sodium chloride flush (NS) 0.9 % injection 3  mL  3 mL Intravenous Q12H Ivor Costa, MD      . vitamin C (ASCORBIC ACID) tablet 1,000 mg  1,000 mg Oral Daily Ivor Costa, MD      . zolpidem (AMBIEN) tablet 5 mg  5 mg Oral QHS PRN Ivor Costa, MD       Labs: Basic Metabolic Panel:  Recent Labs Lab 12/15/16 2148 12/15/16 2201 12/16/16 0707  NA 136 137 137  K 5.0 4.9 4.6  CL 100* 100* 104  CO2 24  --  21*  GLUCOSE 250* 257* 185*  BUN 29* 33* 34*  CREATININE 4.88* 5.30* 4.99*  CALCIUM 8.6*  --  8.8*   Liver Function Tests:  Recent Labs Lab 12/15/16 2148  AST 27  ALT 23  ALKPHOS 92  BILITOT 1.0  PROT 6.6  ALBUMIN 3.4*    Recent Labs Lab 12/15/16 2148  AMMONIA 25   CBC:  Recent Labs Lab 12/15/16 2148 12/15/16 2201 12/16/16 0707  WBC 6.9  --  7.9  NEUTROABS 4.4  --   --   HGB 11.5* 12.2* 11.0*  HCT 36.0* 36.0* 34.4*  MCV 98.1  --  98.3  PLT 140*  --  139*   Cardiac Enzymes:  Recent Labs Lab 12/16/16 0150 12/16/16 0707  TROPONINI <0.03 0.03*   CBG:  Recent Labs Lab 12/15/16 2137 12/16/16 0229 12/16/16 0734  GLUCAP 234* 178* 168*   Studies/Results: Ct Head Wo Contrast  Result Date: 12/15/2016 CLINICAL DATA:  Altered mental status EXAM: CT HEAD WITHOUT CONTRAST TECHNIQUE: Contiguous axial images were obtained from the base of the skull through the vertex without intravenous contrast. COMPARISON:  10/30/2016 FINDINGS: Brain: No acute territorial infarction, hemorrhage or intracranial mass is visualized. Mild atrophy. The ventricles are nonenlarged. Vascular: No hyperdense vessels. Scattered calcifications at the carotid siphons. Skull: No fracture.  No suspicious bone lesion Sinuses/Orbits:  Mucosal thickening within the sphenoid ethmoid and maxillary sinuses. No acute orbital abnormality. Right lens extraction. Other: None IMPRESSION: No definite CT evidence for acute intracranial abnormality. Electronically Signed   By: Donavan Foil M.D.   On: 12/15/2016 23:23   Dg Chest Portable 1 View  Result Date:  12/15/2016 CLINICAL DATA:  Tachypnea EXAM: PORTABLE CHEST 1 VIEW COMPARISON:  10/30/2016 FINDINGS: The patient is status post CABG. There is borderline cardiomegaly which is stable in appearance. There is aortic atherosclerosis without aneurysm. Subtle retrocardiac opacity cannot exclude a left basilar pneumonia. No acute nor suspicious osseous abnormality. IMPRESSION: Status post CABG with aortic atherosclerosis and borderline cardiomegaly. Interval clearing of CHF. Subtle retrocardiac increased opacity cannot exclude a left basilar pneumonia. Correlate. Electronically Signed   By: Ashley Royalty M.D.   On: 12/15/2016 22:38    ROS: As per HPI otherwise negative.  Physical Exam: Vitals:   12/16/16 0200 12/16/16 0215 12/16/16 0230 12/16/16 0328  BP: (!) 170/79 (!) 176/79 (!) 172/66 (!) 175/65  Pulse: 79 79 77 81  Resp:      Temp:    98.7 F (37.1 C)  TempSrc:    Oral  SpO2: 99% 99% 98% 98%  Weight:    63.7 kg (140 lb 8 oz)  Height:    5\' 4"  (1.626 m)     General: clender male NAD Head: NCAT sclera not icteric MMM Neck: Supple.  Lungs: crackles left lateral base; poor expansion overall. Breathing is unlabored. Heart: RRR with S1 S2.  Abdomen: soft NT + BS Lower extremities:without edema or ischemic changes, no open wounds  Neuro: A & O  X 3. Moves all extremities spontaneously. Psych:  Responds to questions appropriately with a normal affect. Dialysis Access: left AVF + bruit  Dialysis Orders:  MWF AF 4 hr 400/800 EDW 60 gets close or to edw 2 K 2.25 Ca left upper AVF heparin 450 no VDRA or binders venofer 50 Mircera 100 due this week down from prev dose 150  Assessment/Plan: 1. Chest pain - hx CAD/CABG -per primary 2. ESRD -  MWF - HD today 3. Hypertension/volume  -takes hydralazine 50 bid and MTP succinate 0.5 of 25 mg at HS as outpt -post HD hypotension 4/2 - is usually getting to edw but denies problems during tmt- pre HD BP tend hight but come down - re-eval EDW while here- needs  updated med list at discharge - his outpt med list is inaccurate; PLAN to stop hydralazine as it can precipitate  BP drops and change to amlodipine 10 mg at HS; continue low dose MTP succ.  With his long history of DM, he could have a degree of autonomic neuropathy as well. 4. Anemia  - hgb 11s stable- continue ESA at lower dose/Fe Friday if still here 5. Metabolic bone disease -  No VDRA or binders 6. Nutrition - renal carb mod when eating - npo at present 7. DM  Myriam Jacobson, PA-C [place] [phone] 12/16/2016, 9:27 AM   Pt seen, examined and agree w A/P as above.  Kelly Splinter MD [employer] pager [phone]   12/16/2016, 5:29 PM

## 2016-12-16 NOTE — Progress Notes (Signed)
[facility] for Heparin Indication: possible PE  Allergies  Allergen Reactions  . Hydralazine Rash    Pt has been tolerating this medication at home    Patient Measurements: Height: 5\' 4"  (162.6 cm) Weight: 140 lb 8 oz (63.7 kg) IBW/kg (Calculated) : 59.2  Vital Signs: Temp: 98.6 F (37 C) (04/04 1026) Temp Source: Oral (04/04 1026) BP: 177/66 (04/04 1026) Pulse Rate: 73 (04/04 1026)  Recent Labs  12/15/16 2148 12/15/16 2201 12/16/16 0150 12/16/16 0707 12/16/16 1320  HGB 11.5* 12.2*  --  11.0*  --   HCT 36.0* 36.0*  --  34.4*  --   PLT 140*  --   --  139*  --   HEPARINUNFRC  --   --   --   --  0.34  CREATININE 4.88* 5.30*  --  4.99*  --   TROPONINI  --   --  <0.03 0.03*  --     Estimated Creatinine Clearance: 13.3 mL/min (A) (by C-G formula based on SCr of 4.99 mg/dL (H)).   Medical History: Past Medical History:  Diagnosis Date  . Acute pulmonary edema ([place]) 05/31/2016  . Acute respiratory failure with hypoxemia ([place]) 05/31/2016  . AKI (acute kidney injury) ([place])   . CHF (congestive heart failure) ([place])   . 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 HCAP (healthcare-associated pneumonia)   . Ischemic rest pain of lower extremity ([place]) 02/06/2015  . 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 NSTEMI (non-ST elevated myocardial infarction) ([place])   . Pain in lower limb 02/06/2015  . Pain in the chest   . Pleural effusion   . 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 Shortness of breath     Assessment: 61 yo male initially started on heparin infusion for a possible PE. D-dimer elevated V/Q scan completed today was negative for PE Dopplers negative for DVT ECHO EF 55-60% with no wall motion abnormalities Patient remains chest pain free Discussed with hospitalist and cards APP, will discontinue heparin   Goal of Therapy:  Heparin level 0.3-0.7 units/ml Monitor platelets by anticoagulation protocol:  Yes     Plan: -Stop heparin infusion   Hughes Better, PharmD, BCPS Clinical Pharmacist 12/16/2016 2:11 PM

## 2016-12-16 NOTE — Progress Notes (Signed)
ANTICOAGULATION CONSULT NOTE - Initial Consult  Pharmacy Consult for Heparin Indication: possible PE  Allergies  Allergen Reactions  . Hydralazine Rash    Pt has been tolerating this medication at home    Patient Measurements: Height: 5\' 4"  (162.6 cm) Weight: 140 lb 8 oz (63.7 kg) IBW/kg (Calculated) : 59.2  Vital Signs: Temp: 98.7 F (37.1 C) (04/04 0328) Temp Source: Oral (04/04 0328) BP: 175/65 (04/04 0328) Pulse Rate: 81 (04/04 0328)  Labs:  Recent Labs  12/15/16 2148 12/15/16 2201 12/16/16 0150  HGB 11.5* 12.2*  --   HCT 36.0* 36.0*  --   PLT 140*  --   --   CREATININE 4.88* 5.30*  --   TROPONINI  --   --  <0.03    Estimated Creatinine Clearance: 12.6 mL/min (A) (by C-G formula based on SCr of 5.3 mg/dL (H)).   Medical History: Past Medical History:  Diagnosis Date  . Acute pulmonary edema ([place]) 05/31/2016  . Acute respiratory failure with hypoxemia ([place]) 05/31/2016  . AKI (acute kidney injury) ([place])   . CHF (congestive heart failure) ([place])   . 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 HCAP (healthcare-associated pneumonia)   . Ischemic rest pain of lower extremity ([place]) 02/06/2015  . 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 NSTEMI (non-ST elevated myocardial infarction) ([place])   . Pain in lower limb 02/06/2015  . Pain in the chest   . Pleural effusion   . 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 Shortness of breath     Medications:  Prescriptions Prior to Admission  Medication Sig Dispense Refill Last Dose  . albuterol (PROVENTIL HFA;VENTOLIN HFA) 108 (90 Base) MCG/ACT inhaler Inhale 2 puffs into the lungs every 6 (six) hours as needed for wheezing.   unk  . aspirin 81 MG chewable tablet Chew 1 tablet (81 mg total) by mouth daily. 30 tablet 1 12/15/2016 at Unknown time  . atorvastatin (LIPITOR) 80 MG tablet Take 1 tablet (80 mg total) by mouth daily. 30 tablet 3 12/15/2016 at Unknown time  . ciprofloxacin (CILOXAN) 0.3 % ophthalmic  solution Place 1 drop into the right eye 4 (four) times daily.   12/15/2016 at Unknown time  . clopidogrel (PLAVIX) 75 MG tablet Take 1 tablet (75 mg total) by mouth daily. 30 tablet 1 12/15/2016 at Unknown time  . ezetimibe (ZETIA) 10 MG tablet Take 1 tablet (10 mg total) by mouth daily. 30 tablet 3 12/15/2016 at Unknown time  . hydrALAZINE (APRESOLINE) 50 MG tablet Take 1.5 tablets (75 mg total) by mouth 2 (two) times daily. (Patient taking differently: Take 50 mg by mouth 2 (two) times daily. ) 90 tablet 3 12/15/2016 at Unknown time  . Insulin Human (INSULIN PUMP) SOLN Inject into the skin continuous. Novolog   12/15/2016 at Unknown time  . ketorolac (ACULAR) 0.5 % ophthalmic solution Place 1 drop into the right eye 4 (four) times daily.   12/15/2016 at Unknown time  . metoprolol succinate (TOPROL-XL) 25 MG 24 hr tablet Take 0.5 tablets (12.5 mg total) by mouth at bedtime. 30 tablet 1 12/15/2016 at 0930  . prednisoLONE acetate (PRED FORTE) 1 % ophthalmic suspension Place 1 drop into the right eye 4 (four) times daily.   12/15/2016 at Unknown time  . vitamin C (ASCORBIC ACID) 500 MG tablet Take 1,000 mg by mouth daily.   12/15/2016 at Unknown time    Assessment: 61 y.o. M presents with CP and SOB. To begin heparin for possible  PE. CBC ok on admission. Plt slightly low but stable for this pt. No AC PTA. Noted that pt is ESRD.  Goal of Therapy:  Heparin level 0.3-0.7 units/ml Monitor platelets by anticoagulation protocol: Yes   Plan: D/c SQ heparin - none given yet  Heparin IV bolus 4000 units Heparin gtt at 1000 units/hr Will f/u heparin level in 8 hours Daily heparin level and CBC  Sherlon Handing, PharmD, BCPS Clinical pharmacist, pager [phone] 12/16/2016,5:01 AM

## 2016-12-16 NOTE — Consult Note (Signed)
Patient ID: [name] MRN: [id number], DOB/AGE: 1956-03-07   Admit date: 12/15/2016   Reason for Consult: Chest Pain Requesting MD: Dr. Algis Liming, Internal Medicine    Primary Physician: [name], MD Primary Cardiologist: Dr. Aundra Dubin   Pt. Profile:  Kenneth [name] is a 61 y.o. male  with a PMH of CAD, systolic HF, IDDM, HTN, HLD and ESRD who is being seen today for the evaluation of chest pain at the request of Dr. Algis Liming, Internal Medicine.   Problem List  Past Medical History:  Diagnosis Date  . Acute pulmonary edema ([place]) 05/31/2016  . Acute respiratory failure with hypoxemia ([place]) 05/31/2016  . AKI (acute kidney injury) ([place])   . CHF (congestive heart failure) ([place])   . 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 HCAP (healthcare-associated pneumonia)   . Ischemic rest pain of lower extremity ([place]) 02/06/2015  . 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 NSTEMI (non-ST elevated myocardial infarction) ([place])   . Pain in lower limb 02/06/2015  . Pain in the chest   . Pleural effusion   . 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 Shortness of breath     Past Surgical History:  Procedure Laterality Date  . AV FISTULA PLACEMENT Left 06/22/2016   Procedure: ARTERIOVENOUS (AV) FISTULA CREATION LEFT UPPER ARM;  Surgeon: Rosetta Posner, MD;  Location: [place];  Service: Vascular;  Laterality: Left;  . CARDIAC CATHETERIZATION N/A 05/31/2016   Procedure: Left Heart Cath and Coronary Angiography;  Surgeon: Jettie Booze, MD;  Location: [place] [facility];  Service: Cardiovascular;  Laterality: N/A;  . CARDIAC CATHETERIZATION N/A 05/31/2016   Procedure: Right Heart Cath;  Surgeon: Jettie Booze, MD;  Location: [place] [facility];  Service: Cardiovascular;  Laterality: N/A;  . CARDIAC CATHETERIZATION N/A 05/31/2016   Procedure: IABP Insertion;  Surgeon: Jettie Booze, MD;  Location: [place] [facility];  Service: Cardiovascular;  Laterality: N/A;  .  CORONARY ARTERY BYPASS GRAFT N/A 06/05/2016   Procedure: CORONARY ARTERY BYPASS GRAFTING (CABG) x3 LIMA to LAD -SVG to OM -SVG to RCA;  Surgeon: Ivin Poot, MD;  Location: [place] [facility];  Service: Open Heart Surgery;  Laterality: N/A;  . INSERTION OF DIALYSIS CATHETER N/A 06/05/2016   Procedure: INSERTION OF DIALYSIS/trialysis CATHETER;  Surgeon: Ivin Poot, MD;  Location: [place];  Service: Vascular;  Laterality: N/A;  . INSERTION OF DIALYSIS CATHETER Right 06/13/2016   Procedure: INSERTION OF DIALYSIS CATHETER RIGHT INTERNAL JUGULAR;  Surgeon: Conrad Leming, MD;  Location: [facility];  Service: Vascular;  Laterality: Right;  . INTRAOPERATIVE TRANSESOPHAGEAL ECHOCARDIOGRAM N/A 06/05/2016   Procedure: INTRAOPERATIVE TRANSESOPHAGEAL ECHOCARDIOGRAM;  Surgeon: Ivin Poot, MD;  Location: [facility];  Service: Open Heart Surgery;  Laterality: N/A;  . LEFT HEART CATH AND CORS/GRAFTS ANGIOGRAPHY N/A 11/03/2016   Procedure: Left Heart Cath and Cors/Grafts Angiography;  Surgeon: Troy Sine, MD;  Location: [place] [facility];  Service: Cardiovascular;  Laterality: N/A;     Allergies  Allergies  Allergen Reactions  . Hydralazine Rash    Pt has been tolerating this medication at home    HPI  Kenneth [name] is a 61 y.o. male  with a PMH of CAD, systolic HF, IDDM, HTN, HLD and ESRD who is being seen today for the evaluation of chest pain at the request of Dr. Algis Liming, Internal Medicine.   The patient is followed by Dr. Aundra Dubin. He had a prolonged  hospitalization, last fall,  from 05/31/16-06/23/17. He initially presented with CP and dyspnea. He developed ARF/ cardiogenic shock requiring intubation and implantation of an IABP. Troponin was elevated ruling him in for NSTEMI. 2D echo showed reduced EF down to 35-40%. Subsequent LHC showed multivessel CAD. Once stabilized, he underwent CABG x 3 per Dr. Nils Pyle on 06/05/16 w/ LIMA-LAD, SVG-OM, SVG-RCA. He had post operative atrial fibrillation and required amiodarone.  Also with worsening renal function and was placed on HD. Note: patient was able to stop HD for a period of time, however he had to restart HD due to difficulty clearing volume and rising BUN/creatinine. He is on HD MWF.  He had a repeat echo 1/18 which showed improvement of EF back to normal at 60-65%.  He was last seen by Dr. Aundra Dubin in the [facility] clinic 10/19/16. He was w/o chest pain but noted to be volume overloaded and hypertensive. This was during the time he was off of HD. Dr. Aundra Dubin elected to increase his Lasix and hydralazine. He was scheduled for 1 month f/u on 11/18/16, however he canceled the appointment and rescheduled for 4/7.   The patient presented to the [facility] ED overnight with complaints of CP, dyspnea, and confusion. His chest pain started 3-4 days ago. Mainly right sided chest pain. Atypical and has lasted up to 1 hr at a time. Slightly pleuritic. Mild associated dyspnea. Sometimes worse with exertion, but not always. He reports full med compliance.   In ED, patient was noted to be markedly hypertensive with initial BP of 201/76. This later improved to 173/76. Initial troponin was negative. BNP abnormal at 881. WBC 6.9, lactate 1.54, potassium 5.0, bicarbonate 24, creatinine 4.88, BUN 29, temperature normal, negative CT head for acute intracranial abnormalities. CXR showed subtle retrocardiac increased opacity. Pt was admitted to telemetry by IM for hypertensive urgency. His symptoms have resolved with improvement of his BP. His 2nd troponin is 0.03. Initial troponin was <0.03. His EKG shows SR with LVH with secondary repolarization abnormality, consistent with previous EKGs.  He is currently CP free and resting comfortably.    Home Medications  Prior to Admission medications   Medication Sig Start Date End Date Taking? Authorizing Provider  albuterol (PROVENTIL HFA;VENTOLIN HFA) 108 (90 Base) MCG/ACT inhaler Inhale 2 puffs into the lungs every 6 (six) hours as needed for wheezing.   Yes  Historical Provider, MD  aspirin 81 MG chewable tablet Chew 1 tablet (81 mg total) by mouth daily. 11/06/16  Yes Costin Karlyne Greenspan, MD  atorvastatin (LIPITOR) 80 MG tablet Take 1 tablet (80 mg total) by mouth daily. 09/22/16 12/21/16 Yes Larey Dresser, MD  ciprofloxacin (CILOXAN) 0.3 % ophthalmic solution Place 1 drop into the right eye 4 (four) times daily.   Yes Historical Provider, MD  clopidogrel (PLAVIX) 75 MG tablet Take 1 tablet (75 mg total) by mouth daily. 11/06/16  Yes Costin Karlyne Greenspan, MD  ezetimibe (ZETIA) 10 MG tablet Take 1 tablet (10 mg total) by mouth daily. 09/22/16 12/21/16 Yes Larey Dresser, MD  hydrALAZINE (APRESOLINE) 50 MG tablet Take 1.5 tablets (75 mg total) by mouth 2 (two) times daily. Patient taking differently: Take 50 mg by mouth 2 (two) times daily.  10/19/16  Yes Larey Dresser, MD  Insulin Human (INSULIN PUMP) SOLN Inject into the skin continuous. Novolog   Yes Historical Provider, MD  ketorolac (ACULAR) 0.5 % ophthalmic solution Place 1 drop into the right eye 4 (four) times daily.   Yes Historical  Provider, MD  metoprolol succinate (TOPROL-XL) 25 MG 24 hr tablet Take 0.5 tablets (12.5 mg total) by mouth at bedtime. 11/05/16  Yes Costin Karlyne Greenspan, MD  prednisoLONE acetate (PRED FORTE) 1 % ophthalmic suspension Place 1 drop into the right eye 4 (four) times daily.   Yes Historical Provider, MD  vitamin C (ASCORBIC ACID) 500 MG tablet Take 1,000 mg by mouth daily.   Yes Historical Provider, MD    Family History  Family History  Problem Relation Age of Onset  . CAD Father   . Colon cancer Father   . Diabetes Brother     Social History  Social History   Social History  . Marital status: Married    Spouse name: N/A  . Number of children: N/A  . Years of education: N/A   Occupational History  . Not on file.   Social History Main Topics  . Smoking status: Former Research scientist (life sciences)  . Smokeless tobacco: Never Used  . Alcohol use No  . Drug use: No  . Sexual activity: Not  on file   Other Topics Concern  . Not on file   Social History Narrative  . No narrative on file     Review of Systems General:  No chills, fever, night sweats or weight changes.  Cardiovascular:  No chest pain, dyspnea on exertion, edema, orthopnea, palpitations, paroxysmal nocturnal dyspnea. Dermatological: No rash, lesions/masses Respiratory: No cough, dyspnea Urologic: No hematuria, dysuria Abdominal:   No nausea, vomiting, diarrhea, bright red blood per rectum, melena, or hematemesis Neurologic:  No visual changes, wkns, changes in mental status. All other systems reviewed and are otherwise negative except as noted above.  Physical Exam  Blood pressure (!) 175/65, pulse 81, temperature 98.7 F (37.1 C), temperature source Oral, resp. rate (!) 24, height 5\' 4"  (1.626 m), weight 140 lb 8 oz (63.7 kg), SpO2 98 %.  General: Pleasant, NAD Psych: Normal affect. Neuro: Alert and oriented X 3. Moves all extremities spontaneously. HEENT: Normal  Neck: Supple without bruits or JVD. Lungs:  Resp regular and unlabored, CTA. Heart: RRR no s3, s4, or murmurs. Abdomen: Soft, non-tender, non-distended, BS + x 4.  Extremities: No clubbing, cyanosis or edema. DP/PT/Radials 2+ and equal bilaterally.  Labs  Troponin [facility] of Care Test)  Recent Labs  12/15/16 2159  TROPIPOC 0.00    Recent Labs  12/16/16 0150 12/16/16 0707  TROPONINI <0.03 0.03*   Lab Results  Component Value Date   WBC 7.9 12/16/2016   HGB 11.0 (L) 12/16/2016   HCT 34.4 (L) 12/16/2016   MCV 98.3 12/16/2016   PLT 139 (L) 12/16/2016    Recent Labs Lab 12/15/16 2148  12/16/16 0707  NA 136  < > 137  K 5.0  < > 4.6  CL 100*  < > 104  CO2 24  --  21*  BUN 29*  < > 34*  CREATININE 4.88*  < > 4.99*  CALCIUM 8.6*  --  8.8*  PROT 6.6  --   --   BILITOT 1.0  --   --   ALKPHOS 92  --   --   ALT 23  --   --   AST 27  --   --   GLUCOSE 250*  < > 185*  < > = values in this interval not displayed. Lab  Results  Component Value Date   CHOL 70 10/13/2016   HDL 26 (L) 10/13/2016   LDLCALC 33 10/13/2016   TRIG 54 10/13/2016  Lab Results  Component Value Date   DDIMER 0.63 (H) 12/15/2016     Radiology/Studies  Ct Head Wo Contrast  Result Date: 12/15/2016 CLINICAL DATA:  Altered mental status EXAM: CT HEAD WITHOUT CONTRAST TECHNIQUE: Contiguous axial images were obtained from the base of the skull through the vertex without intravenous contrast. COMPARISON:  10/30/2016 FINDINGS: Brain: No acute territorial infarction, hemorrhage or intracranial mass is visualized. Mild atrophy. The ventricles are nonenlarged. Vascular: No hyperdense vessels. Scattered calcifications at the carotid siphons. Skull: No fracture.  No suspicious bone lesion Sinuses/Orbits: Mucosal thickening within the sphenoid ethmoid and maxillary sinuses. No acute orbital abnormality. Right lens extraction. Other: None IMPRESSION: No definite CT evidence for acute intracranial abnormality. Electronically Signed   By: Donavan Foil M.D.   On: 12/15/2016 23:23   Dg Chest Portable 1 View  Result Date: 12/15/2016 CLINICAL DATA:  Tachypnea EXAM: PORTABLE CHEST 1 VIEW COMPARISON:  10/30/2016 FINDINGS: The patient is status post CABG. There is borderline cardiomegaly which is stable in appearance. There is aortic atherosclerosis without aneurysm. Subtle retrocardiac opacity cannot exclude a left basilar pneumonia. No acute nor suspicious osseous abnormality. IMPRESSION: Status post CABG with aortic atherosclerosis and borderline cardiomegaly. Interval clearing of CHF. Subtle retrocardiac increased opacity cannot exclude a left basilar pneumonia. Correlate. Electronically Signed   By: Ashley Royalty M.D.   On: 12/15/2016 22:38    ECG  SR with LVH with secondary repolarization abnormality, consistent with previous EKGs.-- personally reviewed   Echocardiogram - pending    ASSESSMENT AND PLAN  Principal Problem:   Hypertensive  urgency Active Problems:   Chronic systolic CHF (congestive heart failure) (H[facility])   S/P CABG x 3   ESRD (end stage renal disease) ([facility])   CAD (coronary artery disease)   DM (diabetes mellitus), type 2 with renal complications ([facility])   Chest pain   Abdominal pain   1. Chest Pain/CAD: s/p CABG x 3 per Dr. Nils Pyle on 06/05/16 w/ LIMA-LAD, SVG-OM, SVG-RCA. Recent CP was in the setting of hypertensive urgency with initial ED BP of 201/76. Initial troponin <0.03. F/u 2nd troponin slightly abnormal at 0.03. 3rd troponin pending. EKG shows SR with LVH with secondary repolarization abnormality, consistent with previous EKGs. His CP has resolved with improvement of BP and addition of IV heparin. Suspect demand ischemia from his hypertensive. Repeat 2D echo pending. If normal, no plans for any additional cardiac w/u. Need to r/o PE given elevated D-dimer. V/Q scan ordered. He is on IV heparin. Continue ASA, Plavix, metoprolol, Lipitor and zetia for CAD.   2. Hypertension improved. Continue to monitor BP closely. Continue metoprolol and hydralazine. He may need further titration of home meds based on hospital BP trends.   3. H/o Systolic HF: EF was as low at 35-40% 05/2017, in the setting of MI complicated by cardiogenic shock. EF improved s/p CABG. 2D echo 09/2016 showed normalization of EF at 60-65%. Repeat echo pending.   4. ESRD: on HD. Management per nephrology.   5. IDDM: management per IM.   6. Elevated D-dimer: 0.63. VQ scan ordered to r/o PE given abnormal d-dimer and CP. V/Q scan elected given his CKD. IM to f/u on result and will treat if positive.   7. HLD: on high dose Lipitor + Zetia. No recent lipid panel on file. Consider checking while inpatient. If not at goal of < 70 mg/dL, we can consider PCSK-9 inhibitors, given his history and other risk factors.   Signed, Lyda Jester, PA-C 12/16/2016, 9:28  AM   Pt seen and examined  I agree with findings as noted by B Simmons above   Pt  currently without CP  Describes R sdied pain  Intermittt  Does note that he was lifting furniture at home recently On exam, pt comfortable laying flat  Neck with normal JVP  Lungs CTA  Cardiac RRR  No S3  Chest Nontender  Abd  No hepaomegaly  Ext without edema    I do not think CP is cardiac in orign  Will review echo.  Volume status is OK  I would resume meds    Keep on statin/Zetia  Dorris Carnes

## 2016-12-16 NOTE — ED Provider Notes (Signed)
[facility] DEPT Provider Note   CSN: [id number] Arrival date & time: 12/15/16  2132     History   Chief Complaint Chief Complaint  Patient presents with  . Altered Mental Status    HPI TROOPER OLANDER is a 61 y.o. male.  HPI   61 year old male with a history of ESRD, who has been on and off dialysis with reinitiation in February, coronary artery disease status post CABG in September 2017, left heart catheterization 11/03/2016 with multiple lesions and plan to follow medical control of symptoms for now, paroxysmal atrial fibrillation, chronic Heart Failure with Ischemic Cardiomyopathy who presents with concern for chest pain, shortness of breath, headache, altered mental status.    Reports chest pain beginning tonight around 7:30 PM after he had dinner. Describes it as a sharp, substernal pain, no radiation.  Not sure if like past MI.  No clear alleviating factors. Had associated dyspnea, diaphoresis over forehead only, nausea.    Largest concern tonight is chest pain and shortness of breath.  Denies cough, fever.  However, also reports abdominal pain this afternoon, was right sided, now resolved. Wife reports he had dialysis yesterday and had hypotension during this and had to lay down flat longer.  Today he also had headache, began slowly and is severe.  Wife denies any known neurologic symptoms. Patient when asked reports he has left sided weakness.  Denies trauma.  Wife reports he has had some hematuria. No dysuria.  Patient also reports shooting pain in right calf.  Interpreter used for history. Several church members present.  Past Medical History:  Diagnosis Date  . Acute pulmonary edema ([place]) 05/31/2016  . Acute respiratory failure with hypoxemia ([place]) 05/31/2016  . AKI (acute kidney injury) ([place])   . CHF (congestive heart failure) ([place])   . 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 HCAP (healthcare-associated pneumonia)   . Ischemic rest pain of lower extremity ([place])  02/06/2015  . 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 NSTEMI (non-ST elevated myocardial infarction) ([place])   . Pain in lower limb 02/06/2015  . Pain in the chest   . Pleural effusion   . 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 Shortness of breath     Patient Active Problem List   Diagnosis Date Noted  . Chest pain 12/16/2016  . Abdominal pain 12/16/2016  . Hypertensive urgency 12/16/2016  . Pressure injury of skin 11/03/2016  . DM (diabetes mellitus), type 2 with renal complications ([place]) 36/14/4315  . Elevated troponin 10/31/2016  . ESRD (end stage renal disease) ([place]) 10/30/2016  . Acute encephalopathy 10/30/2016  . Uremia 10/30/2016  . CAD (coronary artery disease) 10/30/2016  . 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 Chronic systolic CHF (congestive heart failure) ([place])   . AKI (acute kidney injury) ([place])   . HCAP (healthcare-associated pneumonia)   . S/P thoracentesis   . Pleural effusion   . Acute pulmonary edema ([place]) 05/31/2016  . Acute respiratory failure with hypoxemia ([place]) 05/31/2016  . Non-ST elevation (NSTEMI) myocardial infarction ([place])   . Shortness of breath   . Pain in the chest   . 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 Pain in lower limb 02/06/2015  . Ischemic rest pain of lower extremity ([place]) 02/06/2015    Past Surgical History:  Procedure Laterality Date  . AV FISTULA PLACEMENT Left 06/22/2016   Procedure: ARTERIOVENOUS (AV) FISTULA CREATION  LEFT UPPER ARM;  Surgeon: Rosetta Posner, MD;  Location: [facility];  Service: Vascular;  Laterality: Left;  . CARDIAC CATHETERIZATION N/A 05/31/2016   Procedure: Left Heart Cath and Coronary Angiography;  Surgeon: Jettie Booze, MD;  Location: [facility];  Service: Cardiovascular;  Laterality: N/A;  . CARDIAC CATHETERIZATION N/A 05/31/2016   Procedure: Right Heart Cath;  Surgeon:  Jettie Booze, MD;  Location: [facility];  Service: Cardiovascular;  Laterality: N/A;  . CARDIAC CATHETERIZATION N/A 05/31/2016   Procedure: IABP Insertion;  Surgeon: Jettie Booze, MD;  Location: [facility];  Service: Cardiovascular;  Laterality: N/A;  . CORONARY ARTERY BYPASS GRAFT N/A 06/05/2016   Procedure: CORONARY ARTERY BYPASS GRAFTING (CABG) x3 LIMA to LAD -SVG to OM -SVG to RCA;  Surgeon: Ivin Poot, MD;  Location: [facility];  Service: Open Heart Surgery;  Laterality: N/A;  . INSERTION OF DIALYSIS CATHETER N/A 06/05/2016   Procedure: INSERTION OF DIALYSIS/trialysis CATHETER;  Surgeon: Ivin Poot, MD;  Location: [facility];  Service: Vascular;  Laterality: N/A;  . INSERTION OF DIALYSIS CATHETER Right 06/13/2016   Procedure: INSERTION OF DIALYSIS CATHETER RIGHT INTERNAL JUGULAR;  Surgeon: Conrad Adair, MD;  Location: [facility];  Service: Vascular;  Laterality: Right;  . INTRAOPERATIVE TRANSESOPHAGEAL ECHOCARDIOGRAM N/A 06/05/2016   Procedure: INTRAOPERATIVE TRANSESOPHAGEAL ECHOCARDIOGRAM;  Surgeon: Ivin Poot, MD;  Location: [facility];  Service: Open Heart Surgery;  Laterality: N/A;  . LEFT HEART CATH AND CORS/GRAFTS ANGIOGRAPHY N/A 11/03/2016   Procedure: Left Heart Cath and Cors/Grafts Angiography;  Surgeon: Troy Sine, MD;  Location: [facility];  Service: Cardiovascular;  Laterality: N/A;       Home Medications    Prior to Admission medications   Medication Sig Start Date End Date Taking? Authorizing Provider  albuterol (PROVENTIL HFA;VENTOLIN HFA) 108 (90 Base) MCG/ACT inhaler Inhale 2 puffs into the lungs every 6 (six) hours as needed for wheezing.   Yes Historical Provider, MD  aspirin 81 MG chewable tablet Chew 1 tablet (81 mg total) by mouth daily. 11/06/16  Yes Costin Karlyne Greenspan, MD  atorvastatin (LIPITOR) 80 MG tablet Take 1 tablet (80 mg total) by mouth daily. 09/22/16 12/21/16 Yes Larey Dresser, MD  ciprofloxacin (CILOXAN) 0.3 % ophthalmic  solution Place 1 drop into the right eye 4 (four) times daily.   Yes Historical Provider, MD  clopidogrel (PLAVIX) 75 MG tablet Take 1 tablet (75 mg total) by mouth daily. 11/06/16  Yes Costin Karlyne Greenspan, MD  ezetimibe (ZETIA) 10 MG tablet Take 1 tablet (10 mg total) by mouth daily. 09/22/16 12/21/16 Yes Larey Dresser, MD  hydrALAZINE (APRESOLINE) 50 MG tablet Take 1.5 tablets (75 mg total) by mouth 2 (two) times daily. Patient taking differently: Take 50 mg by mouth 2 (two) times daily.  10/19/16  Yes Larey Dresser, MD  Insulin Human (INSULIN PUMP) SOLN Inject into the skin continuous. Novolog   Yes Historical Provider, MD  ketorolac (ACULAR) 0.5 % ophthalmic solution Place 1 drop into the right eye 4 (four) times daily.   Yes Historical Provider, MD  metoprolol succinate (TOPROL-XL) 25 MG 24 hr tablet Take 0.5 tablets (12.5 mg total) by mouth at bedtime. 11/05/16  Yes Costin Karlyne Greenspan, MD  prednisoLONE acetate (PRED FORTE) 1 % ophthalmic suspension Place 1 drop into the right eye 4 (four) times daily.   Yes Historical Provider, MD  vitamin C (ASCORBIC ACID) 500 MG tablet Take 1,000  mg by mouth daily.   Yes Historical Provider, MD    Family History Family History  Problem Relation Age of Onset  . CAD Father   . Colon cancer Father   . Diabetes Brother     Social History Social History  Substance Use Topics  . Smoking status: Former Research scientist (life sciences)  . Smokeless tobacco: Never Used  . Alcohol use No     Allergies   Hydralazine   Review of Systems Review of Systems  Constitutional: Positive for fatigue. Negative for fever.  HENT: Negative for sore throat.   Eyes: Negative for visual disturbance.  Respiratory: Positive for shortness of breath. Negative for cough.   Cardiovascular: Positive for chest pain. Negative for leg swelling.  Gastrointestinal: Positive for abdominal pain and nausea. Negative for diarrhea and vomiting.  Genitourinary: Positive for hematuria. Negative for difficulty  urinating (urinates once daily) and dysuria.  Musculoskeletal: Positive for myalgias (shooting pain right calf). Negative for back pain and neck stiffness.  Skin: Negative for rash.  Neurological: Positive for headaches. Negative for syncope, weakness and numbness.  Psychiatric/Behavioral: Positive for confusion (on arrival to hospital appears confused and sleepy per wife).     Physical Exam Updated Vital Signs BP (!) 171/85   Pulse 80   Temp 97.6 F (36.4 C) (Rectal)   Resp (!) 24   SpO2 98%   Physical Exam  Constitutional: He is oriented to person, place, and time. He appears well-developed and well-nourished. He appears listless. He has a sickly appearance. He appears ill.  HENT:  Head: Normocephalic and atraumatic.  Mouth/Throat: Oropharynx is clear and moist. No oropharyngeal exudate.  Eyes: Conjunctivae and EOM are normal. Pupils are equal, round, and reactive to light.  Neck: Normal range of motion. JVD present.  Cardiovascular: Normal rate, regular rhythm, normal heart sounds and intact distal pulses.  Exam reveals no gallop and no friction rub.   No murmur heard. Pulmonary/Chest: Breath sounds normal. Tachypnea noted. He is in respiratory distress (tachypnea). He has no wheezes. He has no rales.  Abdominal: Soft. He exhibits no distension. There is tenderness (mild suprapubic). There is no guarding.  Musculoskeletal: He exhibits no edema.  Neurological: He is oriented to person, place, and time. He has normal strength. He appears listless. No cranial nerve deficit or sensory deficit. Abnormal coordination: patient slow to perform finger to nose, closes eyes at times and brings finger to side of face, but when redirected to touch nose do not detect consistent coordination abnormality and slow movement of bilateral arms nonspecific. GCS eye subscore is 3. GCS verbal subscore is 4. GCS motor subscore is 6.  Skin: Skin is warm and dry. He is not diaphoretic.  Nursing note and  vitals reviewed.    ED Treatments / Results  Labs (all labs ordered are listed, but only abnormal results are displayed) Labs Reviewed  CBC WITH DIFFERENTIAL/PLATELET - Abnormal; Notable for the following:       Result Value   RBC 3.67 (*)    Hemoglobin 11.5 (*)    HCT 36.0 (*)    Platelets 140 (*)    All other components within normal limits  COMPREHENSIVE METABOLIC PANEL - Abnormal; Notable for the following:    Chloride 100 (*)    Glucose, Bld 250 (*)    BUN 29 (*)    Creatinine, Ser 4.88 (*)    Calcium 8.6 (*)    Albumin 3.4 (*)    GFR calc non Af Amer 12 (*)  GFR calc Af Amer 14 (*)    All other components within normal limits  BRAIN NATRIURETIC PEPTIDE - Abnormal; Notable for the following:    B Natriuretic Peptide 881.1 (*)    All other components within normal limits  D-DIMER, QUANTITATIVE (NOT AT [facility]) - Abnormal; Notable for the following:    D-Dimer, Quant 0.63 (*)    All other components within normal limits  I-STAT CHEM 8, ED - Abnormal; Notable for the following:    Chloride 100 (*)    BUN 33 (*)    Creatinine, Ser 5.30 (*)    Glucose, Bld 257 (*)    Calcium, Ion 1.02 (*)    Hemoglobin 12.2 (*)    HCT 36.0 (*)    All other components within normal limits  I-STAT VENOUS BLOOD GAS, ED - Abnormal; Notable for the following:    pH, Ven 7.472 (*)    pCO2, Ven 37.2 (*)    pO2, Ven 53.0 (*)    Acid-Base Excess 3.0 (*)    All other components within normal limits  CBG MONITORING, ED - Abnormal; Notable for the following:    Glucose-Capillary 234 (*)    All other components within normal limits  CULTURE, BLOOD (ROUTINE X 2)  CULTURE, BLOOD (ROUTINE X 2)  URINE CULTURE  AMMONIA  MAGNESIUM  URINALYSIS, ROUTINE W REFLEX MICROSCOPIC  TROPONIN I  TROPONIN I  TROPONIN I  BASIC METABOLIC PANEL  CBC  I-STAT CG4 LACTIC ACID, ED  I-STAT TROPOININ, ED    EKG  EKG Interpretation  Date/Time:  Tuesday December 15 2016 21:34:44 EDT Ventricular Rate:  79 PR  Interval:    QRS Duration: 107 QT Interval:  411 QTC Calculation: 472 R Axis:   96 Text Interpretation:  Sinus rhythm Probable left atrial enlargement Borderline right axis deviation LVH with secondary repolarization abnormality Anterior ST elevation, probably due to LVH No significant change since last tracing Confirmed by [facility] MD, Sandi Towe ([id number]) on 12/15/2016 10:21:47 PM       Radiology Ct Head Wo Contrast  Result Date: 12/15/2016 CLINICAL DATA:  Altered mental status EXAM: CT HEAD WITHOUT CONTRAST TECHNIQUE: Contiguous axial images were obtained from the base of the skull through the vertex without intravenous contrast. COMPARISON:  10/30/2016 FINDINGS: Brain: No acute territorial infarction, hemorrhage or intracranial mass is visualized. Mild atrophy. The ventricles are nonenlarged. Vascular: No hyperdense vessels. Scattered calcifications at the carotid siphons. Skull: No fracture.  No suspicious bone lesion Sinuses/Orbits: Mucosal thickening within the sphenoid ethmoid and maxillary sinuses. No acute orbital abnormality. Right lens extraction. Other: None IMPRESSION: No definite CT evidence for acute intracranial abnormality. Electronically Signed   By: Donavan Foil M.D.   On: 12/15/2016 23:23   Dg Chest Portable 1 View  Result Date: 12/15/2016 CLINICAL DATA:  Tachypnea EXAM: PORTABLE CHEST 1 VIEW COMPARISON:  10/30/2016 FINDINGS: The patient is status post CABG. There is borderline cardiomegaly which is stable in appearance. There is aortic atherosclerosis without aneurysm. Subtle retrocardiac opacity cannot exclude a left basilar pneumonia. No acute nor suspicious osseous abnormality. IMPRESSION: Status post CABG with aortic atherosclerosis and borderline cardiomegaly. Interval clearing of CHF. Subtle retrocardiac increased opacity cannot exclude a left basilar pneumonia. Correlate. Electronically Signed   By: Ashley Royalty M.D.   On: 12/15/2016 22:38    Procedures Procedures (including  critical care time)  Medications Ordered in ED Medications  ciprofloxacin (CILOXAN) 0.3 % ophthalmic solution 1 drop (not administered)  ketorolac (ACULAR) 0.5 % ophthalmic solution 1  drop (not administered)  prednisoLONE acetate (PRED FORTE) 1 % ophthalmic suspension 1 drop (not administered)  vitamin C (ASCORBIC ACID) tablet 1,000 mg (not administered)  aspirin chewable tablet 81 mg (not administered)  clopidogrel (PLAVIX) tablet 75 mg (not administered)  metoprolol succinate (TOPROL-XL) 24 hr tablet 12.5 mg (not administered)  hydrALAZINE (APRESOLINE) tablet 75 mg (not administered)  atorvastatin (LIPITOR) tablet 80 mg (not administered)  ezetimibe (ZETIA) tablet 10 mg (not administered)  hydrALAZINE (APRESOLINE) injection 10 mg (not administered)  nitroGLYCERIN (NITROSTAT) SL tablet 0.4 mg (not administered)  morphine 4 MG/ML injection 2 mg (not administered)  heparin injection 5,000 Units (not administered)  sodium chloride flush (NS) 0.9 % injection 3 mL (not administered)  acetaminophen (TYLENOL) tablet 650 mg (not administered)    Or  acetaminophen (TYLENOL) suppository 650 mg (not administered)  ondansetron (ZOFRAN) tablet 4 mg (not administered)    Or  ondansetron (ZOFRAN) injection 4 mg (not administered)  zolpidem (AMBIEN) tablet 5 mg (not administered)  sodium chloride 0.9 % bolus 500 mL (500 mLs Intravenous New Bag/Given 12/15/16 2217)     Initial Impression / Assessment and Plan / ED Course  I have reviewed the triage vital signs and the nursing notes.  Pertinent labs & imaging results that were available during my care of the patient were reviewed by me and considered in my medical decision making (see chart for details).    61 year old male with a history of ESRD, who has been on and off dialysis with reinitiation in February, coronary artery disease status post CABG in September 2017, left heart catheterization 11/03/2016 with multiple lesions and plan to follow  medical control of symptoms for now, paroxysmal atrial fibrillation, chronic Heart Failure with Ischemic Cardiomyopathy who presents with concern for chest pain, shortness of breath, headache, altered mental status.  Patient arrives to the emergency department tachypneic, ill-appearing with altered mental status.  Initial ddx included DKA, sepsis, ACS, dissection, PE, CVA, worsening renal failure, hepatic encephalopathy.   Chest x-ray shows questionable retrocardiac opacity, however patient without leukocytosis, afebrile, without cough, and overall clinical suspicion for pneumonia is low. He has equal bilateral pulses, low suspicion for aortic dissection by history, physical, CXR and exam.  BNP 800s, however CXR without pulmonary edema. EKG and troponin within normal limits, however given patient's significant cardiac history, feel observation from the chest pain standpoint is appropriate. In addition, given patient's tachypnea, description of right calf pain, have clinical concern for PE. Given patient has been on and off of dialysis and makes urine, do not feel contrast scan is appropriate, and if ddimer positive recommend VQ scan.    Will plan on admitting patient for continued evaluation of chest pain.   Regarding altered mental status--unclear etiology at this time.  No hypoglycemia, hypercarbia, hyperammonia, uremia, significant electrolyte abnormality, no focal findings on neurologic exam to suggest CVA, no sign of sepsis given no current suspected source of infection, no leukocytosis. Reports headache, has normal head CT, no acute onset doubt SAH. Reports prior abd pain but none now, no transaminitis, benign exam.     Final Clinical Impressions(s) / ED Diagnoses   Final diagnoses:  Somnolence  Tachypnea  Chest pain, unspecified type    New Prescriptions New Prescriptions   No medications on file     Gareth Morgan, MD 12/16/16 0120

## 2016-12-17 DIAGNOSIS — I5022 Chronic systolic (congestive) heart failure: Secondary | ICD-10-CM | POA: Diagnosis not present

## 2016-12-17 DIAGNOSIS — R0602 Shortness of breath: Secondary | ICD-10-CM | POA: Diagnosis not present

## 2016-12-17 DIAGNOSIS — E1122 Type 2 diabetes mellitus with diabetic chronic kidney disease: Secondary | ICD-10-CM | POA: Diagnosis not present

## 2016-12-17 DIAGNOSIS — Z951 Presence of aortocoronary bypass graft: Secondary | ICD-10-CM | POA: Diagnosis not present

## 2016-12-17 DIAGNOSIS — Z992 Dependence on renal dialysis: Secondary | ICD-10-CM

## 2016-12-17 DIAGNOSIS — Z794 Long term (current) use of insulin: Secondary | ICD-10-CM

## 2016-12-17 DIAGNOSIS — R079 Chest pain, unspecified: Secondary | ICD-10-CM | POA: Diagnosis not present

## 2016-12-17 DIAGNOSIS — I160 Hypertensive urgency: Secondary | ICD-10-CM | POA: Diagnosis not present

## 2016-12-17 LAB — HEPATITIS B SURFACE ANTIGEN: Hepatitis B Surface Ag: NEGATIVE

## 2016-12-17 LAB — GLUCOSE, CAPILLARY
Glucose-Capillary: 135 mg/dL — ABNORMAL HIGH (ref 65–99)
Glucose-Capillary: 214 mg/dL — ABNORMAL HIGH (ref 65–99)

## 2016-12-17 MED ORDER — HYDRALAZINE HCL 50 MG PO TABS
50.0000 mg | ORAL_TABLET | Freq: Two times a day (BID) | ORAL | Status: DC
  Administered 2016-12-17: 50 mg via ORAL
  Filled 2016-12-17: qty 1

## 2016-12-17 MED ORDER — AMLODIPINE BESYLATE 10 MG PO TABS: 10.0000 mg | ORAL_TABLET | Freq: Every day | ORAL | 0 refills | Status: DC

## 2016-12-17 MED ORDER — HYDRALAZINE HCL 50 MG PO TABS: 50.0000 mg | ORAL_TABLET | Freq: Two times a day (BID) | ORAL | Status: DC

## 2016-12-17 NOTE — Progress Notes (Signed)
Discharge orders received.  Discharge paperwork reviewed in detail with pt and pt's wife with understanding verbalized by both.  IV and tele monitor removed.  VSS.  No questions or concerns at time of discharge.  Pt discharged to home with wife in stable condition.

## 2016-12-17 NOTE — Discharge Summary (Signed)
Physician Discharge Summary  Kenneth [name] WER:[id number] DOB: 1955/11/30  PCP: [name], MD  Admit date: 12/15/2016 Discharge date: 12/17/2016  Recommendations for Outpatient Follow-up:  1. Dr. Loralie Champagne, Cardiology on 12/24/16 at 2 PM. 2. Hemodialysis Center/Dr. Mercy Moore, nephrology: Patient advised to keep regular hemodialysis appointments on Mondays, Wednesdays & Fridays. Kenneth [name], PCP 4. Patient states that he has an ophthalmologist appointment this afternoon 12/17/16 at 2 PM.  Home Health: None Equipment/Devices: None    Discharge Condition: Improved and stable  CODE STATUS: Full  Diet recommendation: Heart healthy & diabetic diet.  Discharge Diagnoses:  Principal Problem:   Hypertensive urgency Active Problems:   Chronic systolic CHF (congestive heart failure) (HCC)   S/P CABG x 3   ESRD (end stage renal disease) (HCC)   CAD (coronary artery disease)   DM (diabetes mellitus), type 2 with renal complications (HCC)   Chest pain   Abdominal pain   Brief Summary: 61 y.o.malewith medical history significant of ESRD-HD (MWF), hypertension, hyperlipidemia, diabetes mellitus on insulin pump, CAD, s/p of CABG, sCHF (EF improved from 30-40 to recent 60-65%0, depression, who presents with shortness breath, chest pain and abdominal pain.Give history of lifting furniture at home recently. Initial blood pressure in ED 201/76. Transient chest pain and dyspnea resolved. Admitted for further evaluation and management. Cardiology consulted. Nephrology consulted for dialysis needs.   Assessment & Plan:   1. Hypertensive urgency: Initial blood pressure in ED 201/76. Blood pressures have improved but this morning was still 170/71. Nephrology initiated amlodipine 10 MG daily at bedtime yesterday and had stopped home hydralazine. Continued on home metoprolol XL 12.5 MG at bedtime. Cardiology followed up this morning and have reinitiated hydralazine 50 MG twice a day.  Discussed with nephrology service and are okay to discharge on these 3 medications which can be titrated as outpatient as needed. 2. Transient confusion/altered mental status:? Related to hypertensive urgency. CT head without acute findings. No focal neurological deficits. Resolved. Do not think that he had hypertensive encephalopathy or stroke. 3. Chest pain & dyspnea/CAD: Patient is status post CABG 3 on 06/05/16. Current presentation of chest pain was in the context of hypertensive urgency. EKG without acute changes and troponins unremarkable. D-dimer elevated. VQ scan and lower extremity venous Dopplers negative. 2-D echo: LVEF 55-60 percent without wall motion abnormalities and grade 1 diastolic dysfunction. IV heparin was discontinued. Cardiology input appreciated and recommend no additional workup. No clinical suspicion for pneumonia. Cardiology has seen today and suspect atypical right-sided chest pain from demand ischemia from his hypertensive urgency, continue aspirin, Plavix, metoprolol, atorvastatin, Zetia for CAD and have cleared him for discharge home. 4. History of systolic CHF: EF has normalized. Not clinically volume overloaded. Volume management across dialysis. 5. ESRD on MWF HD: Discussed with nephrology. Status post HD on 12/16/16. 6. HLD: Continue Lipitor and Zetia. Can check lipid panel as outpatient and if LDL not at goal <70 mg per DL, can consider PCSK 9 inhibitors. 7. Type II DM: A1c 10/31/16:7.7. Uses insulin pump at home.? Nonfunctional in the hospital. Treated in the hospital with Lantus 5 units daily and SSI. Reasonable inpatient control. Resume insulin pump at discharge. 8. Transient abdominal pain: Unclear etiology but has resolved. Monitor. 9. Anemia and thrombocytopenia: Stable and appear chronic. Periodic follow-up of CBCs across dialysis as outpatient.   Consultants:  Cardiology Nephrology   Procedures:  HD 2-D echo: Study Conclusions  - Left ventricle: The  cavity size was normal. There was  moderate concentric hypertrophy. Systolic function was normal. The estimated ejection fraction was in the range of 55% to 60%. Wall motion was normal; there were no regional wall motion abnormalities. Doppler parameters are consistent with abnormal left ventricular relaxation (grade 1 diastolic dysfunction). Doppler parameters are consistent with elevated ventricular end-diastolic filling pressure. - Aortic valve: Trileaflet; mildly thickened, mildly calcified leaflets. There was no regurgitation. - Aortic root: The aortic root was normal in size. - Mitral valve: There was mild regurgitation. - Left atrium: The atrium was mildly dilated. - Right ventricle: The cavity size was normal. Wall thickness was normal. Systolic function was normal. - Right atrium: The atrium was normal in size. - Tricuspid valve: There was no regurgitation. - Inferior vena cava: The vessel was normal in size. - Pericardium, extracardiac: There was no pericardial effusion.  Impressions:  - No significant difference since the last study on 09/25/2016.  Discharge Instructions  Discharge Instructions    Call MD for:  difficulty breathing, headache or visual disturbances    Complete by:  As directed    Call MD for:  severe uncontrolled pain    Complete by:  As directed    Diet - low sodium heart healthy    Complete by:  As directed    Diet Carb Modified    Complete by:  As directed    Increase activity slowly    Complete by:  As directed        Medication List    TAKE these medications   albuterol 108 (90 Base) MCG/ACT inhaler Commonly known as:  PROVENTIL HFA;VENTOLIN HFA Inhale 2 puffs into the lungs every 6 (six) hours as needed for wheezing.   amLODipine 10 MG tablet Commonly known as:  NORVASC Take 1 tablet (10 mg total) by mouth at bedtime.   aspirin 81 MG chewable tablet Chew 1 tablet (81 mg total) by mouth daily.   atorvastatin 80 MG  tablet Commonly known as:  LIPITOR Take 1 tablet (80 mg total) by mouth daily.   ciprofloxacin 0.3 % ophthalmic solution Commonly known as:  CILOXAN Place 1 drop into the right eye 4 (four) times daily.   clopidogrel 75 MG tablet Commonly known as:  PLAVIX Take 1 tablet (75 mg total) by mouth daily.   ezetimibe 10 MG tablet Commonly known as:  ZETIA Take 1 tablet (10 mg total) by mouth daily.   hydrALAZINE 50 MG tablet Commonly known as:  APRESOLINE Take 1 tablet (50 mg total) by mouth 2 (two) times daily.   insulin pump Soln Inject into the skin continuous. Novolog   ketorolac 0.5 % ophthalmic solution Commonly known as:  ACULAR Place 1 drop into the right eye 4 (four) times daily.   metoprolol succinate 25 MG 24 hr tablet Commonly known as:  TOPROL-XL Take 0.5 tablets (12.5 mg total) by mouth at bedtime.   prednisoLONE acetate 1 % ophthalmic suspension Commonly known as:  PRED FORTE Place 1 drop into the right eye 4 (four) times daily.   vitamin C 500 MG tablet Commonly known as:  ASCORBIC ACID Take 1,000 mg by mouth daily.      Follow-up Information    Loralie Champagne, MD Follow up on 12/24/2016.   Specialty:  Cardiology Why:  2:00 pm  Contact information: [address] [place] [address] [phone]        [name], Donnie Aho, MD. Schedule an appointment as soon as possible for a visit today.   Specialty:  Family Medicine Contact information: [address] [place] [address] [facility].   Why:  Keep your regular appointments for dialysis on Mondays, Wednesdays & Fridays.         Allergies  Allergen Reactions  . Hydralazine Rash    Pt has been tolerating this medication at home      Procedures/Studies: Ct Head Wo Contrast  Result Date: 12/15/2016 CLINICAL DATA:  Altered mental status EXAM: CT HEAD WITHOUT CONTRAST TECHNIQUE: Contiguous axial images were  obtained from the base of the skull through the vertex without intravenous contrast. COMPARISON:  10/30/2016 FINDINGS: Brain: No acute territorial infarction, hemorrhage or intracranial mass is visualized. Mild atrophy. The ventricles are nonenlarged. Vascular: No hyperdense vessels. Scattered calcifications at the carotid siphons. Skull: No fracture.  No suspicious bone lesion Sinuses/Orbits: Mucosal thickening within the sphenoid ethmoid and maxillary sinuses. No acute orbital abnormality. Right lens extraction. Other: None IMPRESSION: No definite CT evidence for acute intracranial abnormality. Electronically Signed   By: Donavan Foil M.D.   On: 12/15/2016 23:23   Nm Pulmonary Perf And Vent  Result Date: 12/16/2016 CLINICAL DATA:  Shortness of breath over the last week. Elevated D-dimer. Renal failure. Dialysis. EXAM: NUCLEAR MEDICINE VENTILATION - PERFUSION LUNG SCAN TECHNIQUE: Ventilation images were obtained in multiple projections using inhaled aerosol Tc-28m DTPA. Perfusion images were obtained in multiple projections after intravenous injection of Tc-38m MAA. RADIOPHARMACEUTICALS:  4.1 mCi Technetium-88m DTPA aerosol inhalation and 31.1 mCi Technetium-56m MAA IV COMPARISON:  Radiography 12/15/2016 FINDINGS: Ventilation: No focal ventilation defect. Perfusion: No wedge shaped peripheral perfusion defects to suggest acute pulmonary embolism. IMPRESSION: Normal VQ scan. Electronically Signed   By: Nelson Chimes M.D.   On: 12/16/2016 13:32   Dg Chest Portable 1 View  Result Date: 12/15/2016 CLINICAL DATA:  Tachypnea EXAM: PORTABLE CHEST 1 VIEW COMPARISON:  10/30/2016 FINDINGS: The patient is status post CABG. There is borderline cardiomegaly which is stable in appearance. There is aortic atherosclerosis without aneurysm. Subtle retrocardiac opacity cannot exclude a left basilar pneumonia. No acute nor suspicious osseous abnormality. IMPRESSION: Status post CABG with aortic atherosclerosis and borderline  cardiomegaly. Interval clearing of CHF. Subtle retrocardiac increased opacity cannot exclude a left basilar pneumonia. Correlate. Electronically Signed   By: Ashley Royalty M.D.   On: 12/15/2016 22:38      Subjective: Denies complaints. No further chest pain, dyspnea or confusion for more than 24 hours. Anxious for discharge because he wants to keep an ophthalmology appointment this afternoon at 2 PM.  Discharge Exam:  Vitals:   12/16/16 1843 12/16/16 2027 12/17/16 0612 12/17/16 1030  BP: (!) 164/77 (!) 164/65 (!) 170/71 (!) 156/71  Pulse: 75  73   Resp:  18    Temp:  98.8 F (37.1 C) 99.5 F (37.5 C)   TempSrc:  Oral Oral   SpO2:  98% 98%   Weight:   61 kg (134 lb 8 oz)   Height:        General exam: Pleasant middle-aged male sitting up In chair eating breakfast this morning. Respiratory system: Clear to auscultation. Respiratory effort normal. Midline sternotomy scar. Cardiovascular system: S1 & S2 heard, RRR. No JVD, murmurs, rubs, gallops or clicks. No pedal edema. Telemetry: Sinus rhythm. Gastrointestinal system: Abdomen is nondistended, soft and nontender. No organomegaly or masses felt. Normal bowel sounds heard. Central nervous system: Alert and oriented. No focal neurological deficits. Extremities: Symmetric 5 x 5 power.  Skin: No rashes, lesions or ulcers Psychiatry: Judgement and insight appear normal. Mood & affect appropriate.     The results of significant diagnostics from this hospitalization (including imaging, microbiology, ancillary and laboratory) are listed below for reference.     Microbiology: Recent Results (from the past 240 hour(s))  Blood culture (routine x 2)     Status: None (Preliminary result)   Collection Time: 12/15/16  9:52 PM  Result Value Ref Range Status   Specimen Description BLOOD RIGHT ARM  Final   Special Requests   Final    BOTTLES DRAWN AEROBIC AND ANAEROBIC Blood Culture adequate volume   Culture NO GROWTH < 12 HOURS  Final    Report Status PENDING  Incomplete     Labs: CBC:  Recent Labs Lab 12/15/16 2148 12/15/16 2201 12/16/16 0707  WBC 6.9  --  7.9  NEUTROABS 4.4  --   --   HGB 11.5* 12.2* 11.0*  HCT 36.0* 36.0* 34.4*  MCV 98.1  --  98.3  PLT 140*  --  435*   Basic Metabolic Panel:  Recent Labs Lab 12/15/16 2148 12/15/16 2201 12/16/16 0707  NA 136 137 137  K 5.0 4.9 4.6  CL 100* 100* 104  CO2 24  --  21*  GLUCOSE 250* 257* 185*  BUN 29* 33* 34*  CREATININE 4.88* 5.30* 4.99*  CALCIUM 8.6*  --  8.8*  MG 2.3  --   --    Liver Function Tests:  Recent Labs Lab 12/15/16 2148  AST 27  ALT 23  ALKPHOS 92  BILITOT 1.0  PROT 6.6  ALBUMIN 3.4*   BNP (last 3 results)  Recent Labs  09/22/16 1432 10/30/16 2024 12/15/16 2148  BNP 1,972.0* 1,900.0* 881.1*   Cardiac Enzymes:  Recent Labs Lab 12/16/16 0150 12/16/16 0707 12/16/16 1320  TROPONINI <0.03 0.03* 0.03*   CBG:  Recent Labs Lab 12/16/16 0229 12/16/16 0734 12/16/16 1208 12/16/16 2124 12/17/16 0731  GLUCAP 178* 168* 111* 159* 135*       Time coordinating discharge: Over 30 minutes  SIGNED:  Vernell Leep, MD, FACP, FHM. [facility] Pager [phone]  If 7PM-7AM, please contact night-coverage [email or web address] Password [email or web address] 12/17/2016, 11:01 AM

## 2016-12-17 NOTE — Progress Notes (Signed)
[facility] Progress Note   Subjective: feeling good, wants to go home  Vitals:   12/16/16 2027 12/17/16 0612 12/17/16 1030 12/17/16 1154  BP: (!) 164/65 (!) 170/71 (!) 156/71 (!) 145/67  Pulse:  73  67  Resp: 18   18  Temp: 98.8 F (37.1 C) 99.5 F (37.5 C)  97.6 F (36.4 C)  TempSrc: Oral Oral  Oral  SpO2: 98% 98%    Weight:  61 kg (134 lb 8 oz)    Height:        Inpatient medications: . amLODipine  10 mg Oral QHS  . aspirin  81 mg Oral Daily  . atorvastatin  80 mg Oral q1800  . ciprofloxacin  1 drop Right Eye QID  . clopidogrel  75 mg Oral Daily  . [START ON 12/18/2016] darbepoetin (ARANESP) injection - DIALYSIS  60 mcg Intravenous Q Fri-HD  . ezetimibe  10 mg Oral Daily  . [START ON 12/18/2016] ferric gluconate (FERRLECIT/NULECIT) IV  62.5 mg Intravenous Q Fri-HD  . hydrALAZINE  50 mg Oral BID  . insulin aspart  0-9 Units Subcutaneous TID WC  . insulin glargine  5 Units Subcutaneous Daily  . ketorolac  1 drop Right Eye TID AC & HS  . metoprolol succinate  12.5 mg Oral QHS  . prednisoLONE acetate  1 drop Right Eye TID AC & HS  . sodium chloride flush  3 mL Intravenous Q12H  . vitamin C  1,000 mg Oral Daily    sodium chloride, sodium chloride, acetaminophen **OR** acetaminophen, alteplase, heparin, heparin, hydrALAZINE, lidocaine (PF), lidocaine-prilocaine, morphine injection, nitroGLYCERIN, ondansetron **OR** ondansetron (ZOFRAN) IV, pentafluoroprop-tetrafluoroeth, technetium TC 17M diethylenetriame-pentaacetic acid, zolpidem  Exam: General: clender male NAD Head: NCAT sclera not icteric MMM Neck: Supple.  Lungs: crackles left lateral base; poor expansion overall. Breathing is unlabored. Heart: RRR with S1 S2.  Abdomen: soft NT + BS Lower extremities:without edema or ischemic changes, no open wounds  Neuro: A & O  X 3. Moves all extremities spontaneously. Psych:  Responds to questions appropriately with a normal affect. Dialysis Access: left AVF +  bruit  Dialysis Orders:  MWF AF 4 hr 400/800 EDW 60 gets close or to edw 2 K 2.25 Ca left upper AVF heparin 450 no VDRA or binders venofer 50 Mircera 100 due this week down from prev dose 150  Assessment: 1. HTN urgency -resolved 2. Chest pain - hx CAD/CABG, prob demand ischemia d/t #1. Trop neg. No further w/u planned.  3. ESRD -  MWF - HD today 4. Hypertension/volume  -takes hydralazine 50 bid and MTP succinate 0.5 of 25 mg at HS as outpt -post HD hypotension 4/2 - is usually getting to edw but denies problems during tmt- pre HD BP tend hight but come down - re-eval EDW while here- needs updated med list at discharge - his outpt med list is inaccurate; PLAN to stop hydralazine as it can precipitate  BP drops and change to amlodipine 10 mg at HS; continue low dose MTP succ.  With his long history of DM, he could have a degree of autonomic neuropathy as well. 5. Anemia  - hgb 11s stable- continue ESA at lower dose/Fe Friday if still here 6. Metabolic bone disease -  No VDRA or binders 7. Nutrition - renal carb mod when eating - npo at present 8. DM  Plan - ok for dc from renal standpoint    Kelly Splinter MD [place] pager [phone]   12/17/2016, 12:14  PM    Recent Labs Lab 12/15/16 2148 12/15/16 2201 12/16/16 0707  NA 136 137 137  K 5.0 4.9 4.6  CL 100* 100* 104  CO2 24  --  21*  GLUCOSE 250* 257* 185*  BUN 29* 33* 34*  CREATININE 4.88* 5.30* 4.99*  CALCIUM 8.6*  --  8.8*    Recent Labs Lab 12/15/16 2148  AST 27  ALT 23  ALKPHOS 92  BILITOT 1.0  PROT 6.6  ALBUMIN 3.4*    Recent Labs Lab 12/15/16 2148 12/15/16 2201 12/16/16 0707  WBC 6.9  --  7.9  NEUTROABS 4.4  --   --   HGB 11.5* 12.2* 11.0*  HCT 36.0* 36.0* 34.4*  MCV 98.1  --  98.3  PLT 140*  --  139*   Iron/TIBC/Ferritin/ %Sat    Component Value Date/Time   IRON 30 (L) 10/31/2016 0846   TIBC 246 (L) 10/31/2016 0846   FERRITIN 491 (H) 10/31/2016 0846   IRONPCTSAT 12 (L)  10/31/2016 4765

## 2016-12-17 NOTE — Discharge Instructions (Addendum)
Please get your medications reviewed and adjusted by your Primary MD. ° °Please request your Primary MD to go over all Hospital Tests and Procedure/Radiological results at the follow up, please get all Hospital records sent to your Prim MD by signing hospital release before you go home. ° °If you had Pneumonia of Lung problems at the Hospital: °Please get a 2 view Chest X ray done in 6-8 weeks after hospital discharge or sooner if instructed by your Primary MD. ° °If you have Congestive Heart Failure: °Please call your Cardiologist or Primary MD anytime you have any of the following symptoms:  °1) 3 pound weight gain in 24 hours or 5 pounds in 1 week  °2) shortness of breath, with or without a dry hacking cough  °3) swelling in the hands, feet or stomach  °4) if you have to sleep on extra pillows at night in order to breathe ° °Follow cardiac low salt diet and 1.5 lit/day fluid restriction. ° °If you have diabetes °Accuchecks 4 times/day, Once in AM empty stomach and then before each meal. °Log in all results and show them to your primary doctor at your next visit. °If any glucose reading is under 80 or above 300 call your primary MD immediately. ° °If you have Seizure/Convulsions/Epilepsy: °Please do not drive, operate heavy machinery, participate in activities at heights or participate in high speed sports until you have seen by Primary MD or a Neurologist and advised to do so again. ° °If you had Gastrointestinal Bleeding: °Please ask your Primary MD to check a complete blood count within one week of discharge or at your next visit. Your endoscopic/colonoscopic biopsies that are pending at the time of discharge, will also need to followed by your Primary MD. ° °Get Medicines reviewed and adjusted. °Please take all your medications with you for your next visit with your Primary MD ° °Please request your Primary MD to go over all hospital tests and procedure/radiological results at the follow up, please ask your  Primary MD to get all Hospital records sent to his/her office. ° °If you experience worsening of your admission symptoms, develop shortness of breath, life threatening emergency, suicidal or homicidal thoughts you must seek medical attention immediately by calling 911 or calling your MD immediately  if symptoms less severe. ° °You must read complete instructions/literature along with all the possible adverse reactions/side effects for all the Medicines you take and that have been prescribed to you. Take any new Medicines after you have completely understood and accpet all the possible adverse reactions/side effects.  ° °Do not drive or operate heavy machinery when taking Pain medications.  ° °Do not take more than prescribed Pain, Sleep and Anxiety Medications ° °Special Instructions: If you have smoked or chewed Tobacco  in the last 2 yrs please stop smoking, stop any regular Alcohol  and or any Recreational drug use. ° °Wear Seat belts while driving. ° °Please note °You were cared for by a hospitalist during your hospital stay. If you have any questions about your discharge medications or the care you received while you were in the hospital after you are discharged, you can call the unit and asked to speak with the hospitalist on call if the hospitalist that took care of you is not available. Once you are discharged, your primary care physician will handle any further medical issues. Please note that NO REFILLS for any discharge medications will be authorized once you are discharged, as it is imperative that you   return to your primary care physician (or establish a relationship with a primary care physician if you do not have one) for your aftercare needs so that they can reassess your need for medications and monitor your lab values.  You can reach the hospitalist office at phone [phone] or fax [phone]   If you do not have a primary care physician, you can call [phone] for a physician  referral.     Nonspecific Chest Pain Chest pain can be caused by many different conditions. There is always a chance that your pain could be related to something serious, such as a heart attack or a blood clot in your lungs. Chest pain can also be caused by conditions that are not life-threatening. If you have chest pain, it is very important to follow up with your health care provider. What are the causes? Causes of this condition include:  Heartburn.  Pneumonia or bronchitis.  Anxiety or stress.  Inflammation around your heart (pericarditis) or lung (pleuritis or pleurisy).  A blood clot in your lung.  A collapsed lung (pneumothorax). This can develop suddenly on its own (spontaneous pneumothorax) or from trauma to the chest.  Shingles infection (varicella-zoster virus).  Heart attack.  Damage to the bones, muscles, and cartilage that make up your chest wall. This can include:  Bruised bones due to injury.  Strained muscles or cartilage due to frequent or repeated coughing or overwork.  Fracture to one or more ribs.  Sore cartilage due to inflammation (costochondritis). What increases the risk? Risk factors for this condition may include:  Activities that increase your risk for trauma or injury to your chest.  Respiratory infections or conditions that cause frequent coughing.  Medical conditions or overeating that can cause heartburn.  Heart disease or family history of heart disease.  Conditions or health behaviors that increase your risk of developing a blood clot.  Having had chicken pox (varicella zoster). What are the signs or symptoms? Chest pain can feel like:  Burning or tingling on the surface of your chest or deep in your chest.  Crushing, pressure, aching, or squeezing pain.  Dull or sharp pain that is worse when you move, cough, or take a deep breath.  Pain that is also felt in your back, neck, shoulder, or arm, or pain that spreads to any of  these areas. Your chest pain may come and go, or it may stay constant. How is this diagnosed? Lab tests or other studies may be needed to find the cause of your pain. Your health care provider may have you take a test called an ECG (electrocardiogram). An ECG records your heartbeat patterns at the time the test is performed. You may also have other tests, such as:  Transthoracic echocardiogram (TTE). In this test, sound waves are used to create a picture of the heart structures and to look at how blood flows through your heart.  Transesophageal echocardiogram (TEE).This is a more advanced imaging test that takes images from inside your body. It allows your health care provider to see your heart in finer detail.  Cardiac monitoring. This allows your health care provider to monitor your heart rate and rhythm in real time.  Holter monitor. This is a portable device that records your heartbeat and can help to diagnose abnormal heartbeats. It allows your health care provider to track your heart activity for several days, if needed.  Stress tests. These can be done through exercise or by taking medicine that makes your heart beat  more quickly.  Blood tests.  Other imaging tests. How is this treated? Treatment depends on what is causing your chest pain. Treatment may include:  Medicines. These may include:  Acid blockers for heartburn.  Anti-inflammatory medicine.  Pain medicine for inflammatory conditions.  Antibiotic medicine, if an infection is present.  Medicines to dissolve blood clots.  Medicines to treat coronary artery disease (CAD).  Supportive care for conditions that do not require medicines. This may include:  Resting.  Applying heat or cold packs to injured areas.  Limiting activities until pain decreases. Follow these instructions at home: Medicines   If you were prescribed an antibiotic, take it as told by your health care provider. Do not stop taking the  antibiotic even if you start to feel better.  Take over-the-counter and prescription medicines only as told by your health care provider. Lifestyle   Do not use any products that contain nicotine or tobacco, such as cigarettes and e-cigarettes. If you need help quitting, ask your health care provider.  Do not drink alcohol.  Make lifestyle changes as directed by your health care provider. These may include:  Getting regular exercise. Ask your health care provider to suggest some activities that are safe for you.  Eating a heart-healthy diet. A registered dietitian can help you to learn healthy eating options.  Maintaining a healthy weight.  Managing diabetes, if necessary.  Reducing stress, such as with yoga or relaxation techniques. General instructions   Avoid any activities that bring on chest pain.  If heartburn is the cause for your chest pain, raise (elevate) the head of your bed about 6 inches (15 cm) by putting blocks under the legs. Sleeping with more pillows does not effectively relieve heartburn because it only changes the position of your head.  Keep all follow-up visits as told by your health care provider. This is important. This includes any further testing if your chest pain does not go away. Contact a health care provider if:  Your chest pain does not go away.  You have a rash with blisters on your chest.  You have a fever.  You have chills. Get help right away if:  Your chest pain is worse.  You have a cough that gets worse, or you cough up blood.  You have severe pain in your abdomen.  You have severe weakness.  You faint.  You have sudden, unexplained chest discomfort.  You have sudden, unexplained discomfort in your arms, back, neck, or jaw.  You have shortness of breath at any time.  You suddenly start to sweat, or your skin gets clammy.  You feel nauseous or you vomit.  You suddenly feel light-headed or dizzy.  Your heart begins to  beat quickly, or it feels like it is skipping beats. These symptoms may represent a serious problem that is an emergency. Do not wait to see if the symptoms will go away. Get medical help right away. Call your local emergency services (911 in the [place] Do not drive yourself to the hospital. This information is not intended to replace advice given to you by your health care provider. Make sure you discuss any questions you have with your health care provider. Document Released: 06/10/2005 Document Revised: 05/25/2016 Document Reviewed: 05/25/2016 Elsevier Interactive Patient Education  2017 [employer].

## 2016-12-17 NOTE — Progress Notes (Signed)
Progress Note  Patient Name: Kenneth [name] Date of Encounter: 12/17/2016  Primary Cardiologist: Dr. Aundra Dubin   Subjective   No complaints today. CP free. No dyspnea.   Inpatient Medications    Scheduled Meds: . amLODipine  10 mg Oral QHS  . aspirin  81 mg Oral Daily  . atorvastatin  80 mg Oral q1800  . ciprofloxacin  1 drop Right Eye QID  . clopidogrel  75 mg Oral Daily  . [START ON 12/18/2016] darbepoetin (ARANESP) injection - DIALYSIS  60 mcg Intravenous Q Fri-HD  . ezetimibe  10 mg Oral Daily  . [START ON 12/18/2016] ferric gluconate (FERRLECIT/NULECIT) IV  62.5 mg Intravenous Q Fri-HD  . insulin aspart  0-9 Units Subcutaneous TID WC  . insulin glargine  5 Units Subcutaneous Daily  . ketorolac  1 drop Right Eye TID AC & HS  . metoprolol succinate  12.5 mg Oral QHS  . prednisoLONE acetate  1 drop Right Eye TID AC & HS  . sodium chloride flush  3 mL Intravenous Q12H  . vitamin C  1,000 mg Oral Daily   Continuous Infusions:  PRN Meds: sodium chloride, sodium chloride, acetaminophen **OR** acetaminophen, alteplase, heparin, heparin, hydrALAZINE, lidocaine (PF), lidocaine-prilocaine, morphine injection, nitroGLYCERIN, ondansetron **OR** ondansetron (ZOFRAN) IV, pentafluoroprop-tetrafluoroeth, technetium TC 33M diethylenetriame-pentaacetic acid, zolpidem   Vital Signs    Vitals:   12/16/16 1840 12/16/16 1843 12/16/16 2027 12/17/16 0612  BP: (!) 148/64 (!) 164/77 (!) 164/65 (!) 170/71  Pulse: 73 75  73  Resp: (!) 21  18   Temp: 97.7 F (36.5 C)  98.8 F (37.1 C) 99.5 F (37.5 C)  TempSrc:   Oral Oral  SpO2:   98% 98%  Weight: 136 lb 0.4 oz (61.7 kg)   134 lb 8 oz (61 kg)  Height:        Intake/Output Summary (Last 24 hours) at 12/17/16 0749 Last data filed at 12/16/16 2030  Gross per 24 hour  Intake              240 ml  Output             2000 ml  Net            -1760 ml   Filed Weights   12/16/16 1522 12/16/16 1840 12/17/16 0612  Weight: 139 lb 15.9 oz (63.5 kg)  136 lb 0.4 oz (61.7 kg) 134 lb 8 oz (61 kg)    Telemetry    NSR - Personally Reviewed  ECG    NSR - Personally Reviewed  Physical Exam   GEN: No acute distress.   Neck: No JVD Cardiac: RRR, no murmurs, rubs, or gallops.  Respiratory: Clear to auscultation bilaterally. GI: Soft, nontender, non-distended  MS: No edema; No deformity. Neuro:  Nonfocal  Psych: Normal affect   Labs    Chemistry Recent Labs Lab 12/15/16 2148 12/15/16 2201 12/16/16 0707  NA 136 137 137  K 5.0 4.9 4.6  CL 100* 100* 104  CO2 24  --  21*  GLUCOSE 250* 257* 185*  BUN 29* 33* 34*  CREATININE 4.88* 5.30* 4.99*  CALCIUM 8.6*  --  8.8*  PROT 6.6  --   --   ALBUMIN 3.4*  --   --   AST 27  --   --   ALT 23  --   --   ALKPHOS 92  --   --   BILITOT 1.0  --   --   GFRNONAA 12*  --  12*  GFRAA 14*  --  13*  ANIONGAP 12  --  12     Hematology Recent Labs Lab 12/15/16 2148 12/15/16 2201 12/16/16 0707  WBC 6.9  --  7.9  RBC 3.67*  --  3.50*  HGB 11.5* 12.2* 11.0*  HCT 36.0* 36.0* 34.4*  MCV 98.1  --  98.3  MCH 31.3  --  31.4  MCHC 31.9  --  32.0  RDW 15.2  --  14.9  PLT 140*  --  139*    Cardiac Enzymes Recent Labs Lab 12/16/16 0150 12/16/16 0707 12/16/16 1320  TROPONINI <0.03 0.03* 0.03*    Recent Labs Lab 12/15/16 2159  TROPIPOC 0.00     BNP Recent Labs Lab 12/15/16 2148  BNP 881.1*     DDimer  Recent Labs Lab 12/15/16 2148  DDIMER 0.63*     Radiology    Ct Head Wo Contrast  Result Date: 12/15/2016 CLINICAL DATA:  Altered mental status EXAM: CT HEAD WITHOUT CONTRAST TECHNIQUE: Contiguous axial images were obtained from the base of the skull through the vertex without intravenous contrast. COMPARISON:  10/30/2016 FINDINGS: Brain: No acute territorial infarction, hemorrhage or intracranial mass is visualized. Mild atrophy. The ventricles are nonenlarged. Vascular: No hyperdense vessels. Scattered calcifications at the carotid siphons. Skull: No fracture.  No  suspicious bone lesion Sinuses/Orbits: Mucosal thickening within the sphenoid ethmoid and maxillary sinuses. No acute orbital abnormality. Right lens extraction. Other: None IMPRESSION: No definite CT evidence for acute intracranial abnormality. Electronically Signed   By: Donavan Foil M.D.   On: 12/15/2016 23:23   Nm Pulmonary Perf And Vent  Result Date: 12/16/2016 CLINICAL DATA:  Shortness of breath over the last week. Elevated D-dimer. Renal failure. Dialysis. EXAM: NUCLEAR MEDICINE VENTILATION - PERFUSION LUNG SCAN TECHNIQUE: Ventilation images were obtained in multiple projections using inhaled aerosol Tc-25m DTPA. Perfusion images were obtained in multiple projections after intravenous injection of Tc-62m MAA. RADIOPHARMACEUTICALS:  4.1 mCi Technetium-70m DTPA aerosol inhalation and 31.1 mCi Technetium-34m MAA IV COMPARISON:  Radiography 12/15/2016 FINDINGS: Ventilation: No focal ventilation defect. Perfusion: No wedge shaped peripheral perfusion defects to suggest acute pulmonary embolism. IMPRESSION: Normal VQ scan. Electronically Signed   By: Nelson Chimes M.D.   On: 12/16/2016 13:32   Dg Chest Portable 1 View  Result Date: 12/15/2016 CLINICAL DATA:  Tachypnea EXAM: PORTABLE CHEST 1 VIEW COMPARISON:  10/30/2016 FINDINGS: The patient is status post CABG. There is borderline cardiomegaly which is stable in appearance. There is aortic atherosclerosis without aneurysm. Subtle retrocardiac opacity cannot exclude a left basilar pneumonia. No acute nor suspicious osseous abnormality. IMPRESSION: Status post CABG with aortic atherosclerosis and borderline cardiomegaly. Interval clearing of CHF. Subtle retrocardiac increased opacity cannot exclude a left basilar pneumonia. Correlate. Electronically Signed   By: Ashley Royalty M.D.   On: 12/15/2016 22:38    Cardiac Studies   2D Echo 12/16/16 Study Conclusions  - Left ventricle: The cavity size was normal. There was moderate   concentric hypertrophy.  Systolic function was normal. The   estimated ejection fraction was in the range of 55% to 60%. Wall   motion was normal; there were no regional wall motion   abnormalities. Doppler parameters are consistent with abnormal   left ventricular relaxation (grade 1 diastolic dysfunction).   Doppler parameters are consistent with elevated ventricular   end-diastolic filling pressure. - Aortic valve: Trileaflet; mildly thickened, mildly calcified   leaflets. There was no regurgitation. - Aortic root: The aortic root was normal in  size. - Mitral valve: There was mild regurgitation. - Left atrium: The atrium was mildly dilated. - Right ventricle: The cavity size was normal. Wall thickness was   normal. Systolic function was normal. - Right atrium: The atrium was normal in size. - Tricuspid valve: There was no regurgitation. - Inferior vena cava: The vessel was normal in size. - Pericardium, extracardiac: There was no pericardial effusion.  Impressions:  - No significant difference since the last study on 09/25/2016.  Patient Profile     Kenneth [name] is a 61 y.o. male  with a PMH of CAD, systolic HF, IDDM, HTN, HLD and ESRD who is being seen  for the evaluation of chest pain at the request of Dr. Algis Liming, Internal Medicine.   Assessment & Plan     1. Chest Pain/CAD: s/p CABG x 3 per Dr. Nils Pyle on 06/05/16 w/ LIMA-LAD, SVG-OM, SVG-RCA. Recent CP was in the setting of hypertensive urgency with initial ED BP of 201/76. Troponin's unremarkable. Initial troponin <0.03. F/u 2nd and 3rd troponins both 0.03. EKG shows SR with LVH with secondary repolarization abnormality, consistent with previous EKGs. V/Q scan low risk for PE. 2D echo shows normal LVEF and wall motion. Suspect demand ischemia from his hypertensive urgency. No additional cardiac w/u indicated at this time. Continue ASA, Plavix, metoprolol, Lipitor and zetia for CAD.   2. Hypertension improved from ED but still elevated. Most  recent BP 170/71. Consider increasing his metoprolol further from 12.5>>25 mg. Continue amlodipine. Will add back Hydralazine.   He was taking at home    3. H/o Systolic HF: EF was as low at 35-40% 05/2017, in the setting of MI complicated by cardiogenic shock. EF improved s/p CABG. 2D echo 09/2016 showed normalization of EF at 60-65%. Repeat echo this admission shows normal LVEF 55-60%.   4. ESRD: on HD. Management per nephrology.   5. IDDM: management per IM.   6. Elevated D-dimer: 0.63. VQ scan low probability for PE.  7. HLD: on high dose Lipitor + Zetia. No recent lipid panel on file. Consider checking while inpatient. If not at goal of < 70 mg/dL, we can consider PCSK-9 inhibitors, given his history and other risk factors.    Signed, Lyda Jester, PA-C  12/17/2016, 7:49 AM     Pt seen and examined  Agree with findingas as noted above by B Simmons   Pt currently pain free  Pain was atypical R sided  Echo as noted above ON exam, Lungs are CTA  Cardiac RRR  No S3  Ext are without edema   BP remains high Note amlodipine 10 just added to regimen  Yesterday WIll add back hydralazine   OK for d/c from cardiac standpoint  He can be followed in [place]

## 2016-12-20 LAB — CULTURE, BLOOD (ROUTINE X 2)
Culture: NO GROWTH
Special Requests: ADEQUATE

## 2016-12-22 LAB — CULTURE, BLOOD (ROUTINE X 2)
CULTURE: NO GROWTH
Special Requests: ADEQUATE

## 2016-12-24 ENCOUNTER — Encounter (HOSPITAL_COMMUNITY): Payer: Self-pay

## 2016-12-24 ENCOUNTER — Ambulatory Visit (HOSPITAL_COMMUNITY)
Admission: RE | Admit: 2016-12-24 | Discharge: 2016-12-24 | Disposition: A | Discharge status: Home / Self Care | Payer: BLUE CROSS/BLUE SHIELD | Source: Ambulatory Visit | Attending: Cardiology | Admitting: Cardiology

## 2016-12-24 VITALS — BP 158/70 | HR 71 | Wt 142.0 lb

## 2016-12-24 DIAGNOSIS — N186 End stage renal disease: Secondary | ICD-10-CM | POA: Diagnosis not present

## 2016-12-24 DIAGNOSIS — E784 Other hyperlipidemia: Secondary | ICD-10-CM | POA: Diagnosis not present

## 2016-12-24 DIAGNOSIS — E7849 Other hyperlipidemia: Secondary | ICD-10-CM

## 2016-12-24 DIAGNOSIS — E1122 Type 2 diabetes mellitus with diabetic chronic kidney disease: Secondary | ICD-10-CM | POA: Insufficient documentation

## 2016-12-24 DIAGNOSIS — Z951 Presence of aortocoronary bypass graft: Secondary | ICD-10-CM | POA: Insufficient documentation

## 2016-12-24 DIAGNOSIS — I132 Hypertensive heart and chronic kidney disease with heart failure and with stage 5 chronic kidney disease, or end stage renal disease: Secondary | ICD-10-CM | POA: Diagnosis not present

## 2016-12-24 DIAGNOSIS — Z7982 Long term (current) use of aspirin: Secondary | ICD-10-CM | POA: Diagnosis not present

## 2016-12-24 DIAGNOSIS — I252 Old myocardial infarction: Secondary | ICD-10-CM | POA: Diagnosis not present

## 2016-12-24 DIAGNOSIS — Z992 Dependence on renal dialysis: Secondary | ICD-10-CM

## 2016-12-24 DIAGNOSIS — I739 Peripheral vascular disease, unspecified: Secondary | ICD-10-CM | POA: Diagnosis not present

## 2016-12-24 DIAGNOSIS — I255 Ischemic cardiomyopathy: Secondary | ICD-10-CM | POA: Insufficient documentation

## 2016-12-24 DIAGNOSIS — E785 Hyperlipidemia, unspecified: Secondary | ICD-10-CM | POA: Insufficient documentation

## 2016-12-24 DIAGNOSIS — Z7902 Long term (current) use of antithrombotics/antiplatelets: Secondary | ICD-10-CM | POA: Diagnosis not present

## 2016-12-24 DIAGNOSIS — I2510 Atherosclerotic heart disease of native coronary artery without angina pectoris: Secondary | ICD-10-CM | POA: Insufficient documentation

## 2016-12-24 DIAGNOSIS — I5022 Chronic systolic (congestive) heart failure: Secondary | ICD-10-CM | POA: Diagnosis not present

## 2016-12-24 NOTE — Patient Instructions (Signed)
Follow up 3 months with Dr. Aundra Dubin.  Do the following things EVERYDAY: 1) Weigh yourself in the morning before breakfast. Write it down and keep it in a log. 2) Take your medicines as prescribed 3) Eat low salt foods-Limit salt (sodium) to 2000 mg per day.  4) Stay as active as you can everyday 5) Limit all fluids for the day to less than 2 liters

## 2016-12-26 NOTE — Progress Notes (Signed)
PCP: Dr. Tasia Catchings [facility]) Cardiology: Dr. Aundra Dubin  61 yo with history of type II diabetes, ESRD, CAD, and chronic systolic CHF presents for cardiology followup.  He was admitted in 9/17 with NSTEMI.  LHC showed 3VD and he had CABG x 3.  He had CKD stage IV when he was admitted and developed ESRD requiring HD while in the hospital.  He had transient atrial fibrillation post-CABG.  He developed cardiogenic shock requiring milrinone but was able to taper off.  Echo in 9/17 showed EF 35-40%.  Repeat echo in 1/18 showed EF up to 60-65%.     He was able to stop HD for a period of time but was admitted in 3/18 with acute uremic encephalopathy as well as NSTEMI.  He had to restart HD.  Cath showed occluded SVG-RCA but native RCA was collateralized so no intervention.   He is doing well back on HD.  No chest pain.  He does not have significant exertional dyspnea.  No orthopnea/PND.  No palpitations.  BP is high today.  Last echo in 4/18 showed EF remains 55-60%.   Labs (11/17): LDL 106, HDL 33 Labs (1/18): LDL 33, AST 46, ALT 62, BNP 1972, K 4.9, creatinine 3.87  PMH: 1. Type II diabetes 2. HTN 3. ESRD.  4. CAD: NSTEMI 9/17 with CABG x 3 (LIMA-LAD, SVG-OM, SVG-RCA).   - NSTEMI 3/18: LHC with patent LIMA-LAD, patent SVG-OM, occluded SVG-RCA but RCA collateralized well.  5. Chronic systolic CHF: Ischemic cardiomyopathy.   - Echo (9/17) with EF 35-40%, grade II diastolic dysfunction.  - Echo (1/18) with EF 60-65%, grade II diastolic dysfunction. - Echo (4/18) with EF 55-60%, moderate LVH 6. Atrial fibrillation: Paroxysmal.  Only noted post-op CABG.  7. ABIs normal in 12/17.   SH: Married, lives in [place], 1 son, nonsmoker, no ETOH, speaks only [place].   FH: No premature CAD.  ROS: All systems reviewed and negative except as per HPI.   Current Outpatient Prescriptions  Medication Sig Dispense Refill  . amLODipine (NORVASC) 10 MG tablet Take 1 tablet (10 mg total) by mouth at bedtime. 30 tablet 0   . aspirin 81 MG chewable tablet Chew 1 tablet (81 mg total) by mouth daily. 30 tablet 1  . atorvastatin (LIPITOR) 80 MG tablet Take 1 tablet (80 mg total) by mouth daily. 30 tablet 3  . clopidogrel (PLAVIX) 75 MG tablet Take 1 tablet (75 mg total) by mouth daily. 30 tablet 1  . ezetimibe (ZETIA) 10 MG tablet Take 1 tablet (10 mg total) by mouth daily. 30 tablet 3  . hydrALAZINE (APRESOLINE) 50 MG tablet Take 1 tablet (50 mg total) by mouth 2 (two) times daily.    . metoprolol succinate (TOPROL-XL) 25 MG 24 hr tablet Take 0.5 tablets (12.5 mg total) by mouth at bedtime. 30 tablet 1  . vitamin C (ASCORBIC ACID) 500 MG tablet Take 1,000 mg by mouth daily.    Marland Kitchen albuterol (PROVENTIL HFA;VENTOLIN HFA) 108 (90 Base) MCG/ACT inhaler Inhale 2 puffs into the lungs every 6 (six) hours as needed for wheezing.    . ciprofloxacin (CILOXAN) 0.3 % ophthalmic solution Place 1 drop into the right eye 4 (four) times daily.    . Insulin Human (INSULIN PUMP) SOLN Inject into the skin continuous. Novolog    . ketorolac (ACULAR) 0.5 % ophthalmic solution Place 1 drop into the right eye 4 (four) times daily.    . prednisoLONE acetate (PRED FORTE) 1 % ophthalmic suspension Place 1 drop into  the right eye 4 (four) times daily.     No current facility-administered medications for this encounter.    BP (!) 158/70 (BP Location: Right Arm, Patient Position: Sitting, Cuff Size: Normal)   Pulse 71   Wt 142 lb (64.4 kg)   SpO2 98%   BMI 24.37 kg/m  General: NAD Neck: JVP 7 cm, no thyromegaly or thyroid nodule.  Lungs: CTAB CV: Nondisplaced PMI.  Heart regular S1/S2, no S3/S4, no murmur.  No edema.  No carotid bruit.  Difficult to palpate pedal pulses.  Abdomen: Soft, nontender, no hepatosplenomegaly, no distention.  Skin: Intact without lesions or rashes.  Neurologic: Alert and oriented x 3.  Psych: Normal affect. Extremities: No clubbing or cyanosis.  HEENT: Normal.   Assessment/Plan: 1. CAD: s/p CABG.  Admitted  with NSTEMI in 3/18.  SVG-RCA was occluded but RCA was well-collateralized so no intervention.  No exertional chest pain. - Continue ASA and atorvastatin.    2. Hyperlipidemia: Good lipids on Zetia + atorvastatin.  3. Chronic systolic CHF: Ischemic cardiomyopathy.  EF 35-40% on 9/17 echo but increased back to 60-65% on 1/18 echo and 55-60% on 4/18 echo. He is not back on HD with no exertional dyspnea or volume overload.  - Continue current Toprol XL and hydralazine.   4. PAD: ABIs normal in 12/17. 5. HTN: BP mildly elevated today.  I will not increase his meds as may drop too low with HD.  6. ESRD: tolerating HD well.   Followup 3 months    Loralie Champagne 12/26/2016

## 2017-01-12 ENCOUNTER — Other Ambulatory Visit (HOSPITAL_COMMUNITY): Payer: Self-pay | Admitting: Cardiology

## 2017-01-12 MED ORDER — CLOPIDOGREL BISULFATE 75 MG PO TABS: 75.0000 mg | ORAL_TABLET | Freq: Every day | ORAL | 2 refills | Status: DC

## 2017-01-12 NOTE — Addendum Note (Signed)
Addended by: JEFFRIES, Sharlot Gowda on: 01/12/2017 02:45 PM   Modules accepted: Orders

## 2017-01-13 DIAGNOSIS — Z736 Limitation of activities due to disability: Secondary | ICD-10-CM | POA: Diagnosis not present

## 2017-01-14 ENCOUNTER — Encounter: Payer: Self-pay | Admitting: Podiatry

## 2017-01-14 ENCOUNTER — Ambulatory Visit (INDEPENDENT_AMBULATORY_CARE_PROVIDER_SITE_OTHER): Payer: BLUE CROSS/BLUE SHIELD | Admitting: Podiatry

## 2017-01-14 DIAGNOSIS — E08621 Diabetes mellitus due to underlying condition with foot ulcer: Secondary | ICD-10-CM | POA: Diagnosis not present

## 2017-01-14 DIAGNOSIS — E1151 Type 2 diabetes mellitus with diabetic peripheral angiopathy without gangrene: Secondary | ICD-10-CM | POA: Diagnosis not present

## 2017-01-14 DIAGNOSIS — E0842 Diabetes mellitus due to underlying condition with diabetic polyneuropathy: Secondary | ICD-10-CM | POA: Diagnosis not present

## 2017-01-14 DIAGNOSIS — L97401 Non-pressure chronic ulcer of unspecified heel and midfoot limited to breakdown of skin: Secondary | ICD-10-CM | POA: Diagnosis not present

## 2017-01-14 NOTE — Progress Notes (Signed)
Subjective: 61 year old male presents complaining of blistered right foot. He was seen here for Diabetic foot pain 2 years ago and treated with Neurontin. Stated that he is doing daily walking exercise for a couple of hours and had difficulty finding proper shoe gear that is comfortable. Also stated that he is in insulin pump and doing better now.  He was seen by his PCP who noted the blister on his foot and advised him to seek podiatry care ASAP. Patient called yesterday and made the appointment.  Review of Systems - General ROS: negative Ophthalmic ROS: has had cataract surgery. ENT ROS: negative Allergy and Immunology ROS: negative Endocrine ROS: negative Respiratory ROS: no cough, shortness of breath, or wheezing Cardiovascular ROS: no chest pain or dyspnea on exertion Gastrointestinal ROS: no abdominal pain, change in bowel habits, or black or bloody stools Genito-Urinary ROS: no dysuria, trouble voiding, or hematuria Musculoskeletal ROS: Pain in lower limbs day or night. Neurological ROS: no TIA or stroke symptoms Dermatological ROS: negative.  Objective: Dermatologic: loss of dermal layer over the 3rd digit possible due to friction trauma, blister at the base of 2nd digit right foot. Pain under the 5th MPJ right foot with callus formation. Mild cellulitis at the margin of macerated area 3rd digit right. Vascular: Pedal pulses are palpable. Cold feet. No edema or erythema. Neurologic: All epicritic and tactile sensations are grossly intact. Normal response to monofilament sensory testing.  Orthopedic: Plantar flexed 5th MPJ with painful callus right.  Assessment: Diabetic Neuropathy. Skin maceration over the 2nd and 3rd right. Plantar flexed 5th MPJ with painful callus right foot.  Plan: Reviewed clinical findings and available options.  Wound cleansed with 50% H2O2 and Amerigel ointment dressing applied. Home care instruction with dressing supply including Amerigel  ointment dispensed. After dressing and tube foam padding over the affected area the right foot placed in surgical shoe.  Patient is to stay in surgical shoe during the day.  Change walking exercise to stationary bike till the problem is resolved. Will order diabetic shoes next visit. Return in 2 weeks.

## 2017-01-14 NOTE — Patient Instructions (Signed)
Seen for blistered toe right foot. Dressing change done and Amerigel ointment and dressing supply dispensed. Stay in Surgical shoe. Return in 2 weeks.

## 2017-01-18 ENCOUNTER — Inpatient Hospital Stay (HOSPITAL_COMMUNITY): Payer: Medicare Other

## 2017-01-18 ENCOUNTER — Encounter (HOSPITAL_COMMUNITY): Payer: Self-pay | Admitting: Internal Medicine

## 2017-01-18 ENCOUNTER — Inpatient Hospital Stay (HOSPITAL_COMMUNITY)
Admission: EM | Admit: 2017-01-18 | Discharge: 2017-01-21 | DRG: 638 | Disposition: A | Discharge status: Home / Self Care | Payer: Medicare Other | Attending: Student in an Organized Health Care Education/Training Program | Admitting: Student in an Organized Health Care Education/Training Program

## 2017-01-18 DIAGNOSIS — Z7902 Long term (current) use of antithrombotics/antiplatelets: Secondary | ICD-10-CM

## 2017-01-18 DIAGNOSIS — I739 Peripheral vascular disease, unspecified: Secondary | ICD-10-CM | POA: Diagnosis present

## 2017-01-18 DIAGNOSIS — I25118 Atherosclerotic heart disease of native coronary artery with other forms of angina pectoris: Secondary | ICD-10-CM | POA: Diagnosis present

## 2017-01-18 DIAGNOSIS — E1122 Type 2 diabetes mellitus with diabetic chronic kidney disease: Secondary | ICD-10-CM | POA: Diagnosis present

## 2017-01-18 DIAGNOSIS — Z888 Allergy status to other drugs, medicaments and biological substances status: Secondary | ICD-10-CM

## 2017-01-18 DIAGNOSIS — L03119 Cellulitis of unspecified part of limb: Secondary | ICD-10-CM

## 2017-01-18 DIAGNOSIS — L03116 Cellulitis of left lower limb: Secondary | ICD-10-CM | POA: Diagnosis present

## 2017-01-18 DIAGNOSIS — E11621 Type 2 diabetes mellitus with foot ulcer: Secondary | ICD-10-CM | POA: Diagnosis present

## 2017-01-18 DIAGNOSIS — E1151 Type 2 diabetes mellitus with diabetic peripheral angiopathy without gangrene: Secondary | ICD-10-CM | POA: Diagnosis present

## 2017-01-18 DIAGNOSIS — Z794 Long term (current) use of insulin: Secondary | ICD-10-CM

## 2017-01-18 DIAGNOSIS — L97519 Non-pressure chronic ulcer of other part of right foot with unspecified severity: Secondary | ICD-10-CM | POA: Diagnosis present

## 2017-01-18 DIAGNOSIS — B9561 Methicillin susceptible Staphylococcus aureus infection as the cause of diseases classified elsewhere: Secondary | ICD-10-CM | POA: Diagnosis present

## 2017-01-18 DIAGNOSIS — Z9641 Presence of insulin pump (external) (internal): Secondary | ICD-10-CM | POA: Diagnosis present

## 2017-01-18 DIAGNOSIS — I998 Other disorder of circulatory system: Secondary | ICD-10-CM | POA: Diagnosis present

## 2017-01-18 DIAGNOSIS — L089 Local infection of the skin and subcutaneous tissue, unspecified: Secondary | ICD-10-CM | POA: Diagnosis not present

## 2017-01-18 DIAGNOSIS — M216X1 Other acquired deformities of right foot: Secondary | ICD-10-CM | POA: Diagnosis present

## 2017-01-18 DIAGNOSIS — E785 Hyperlipidemia, unspecified: Secondary | ICD-10-CM | POA: Diagnosis present

## 2017-01-18 DIAGNOSIS — N186 End stage renal disease: Secondary | ICD-10-CM | POA: Diagnosis present

## 2017-01-18 DIAGNOSIS — I2510 Atherosclerotic heart disease of native coronary artery without angina pectoris: Secondary | ICD-10-CM | POA: Diagnosis present

## 2017-01-18 DIAGNOSIS — I5042 Chronic combined systolic (congestive) and diastolic (congestive) heart failure: Secondary | ICD-10-CM | POA: Diagnosis present

## 2017-01-18 DIAGNOSIS — I5022 Chronic systolic (congestive) heart failure: Secondary | ICD-10-CM | POA: Diagnosis present

## 2017-01-18 DIAGNOSIS — I252 Old myocardial infarction: Secondary | ICD-10-CM

## 2017-01-18 DIAGNOSIS — L03031 Cellulitis of right toe: Secondary | ICD-10-CM | POA: Diagnosis not present

## 2017-01-18 DIAGNOSIS — L03115 Cellulitis of right lower limb: Secondary | ICD-10-CM

## 2017-01-18 DIAGNOSIS — E11628 Type 2 diabetes mellitus with other skin complications: Principal | ICD-10-CM | POA: Diagnosis present

## 2017-01-18 DIAGNOSIS — L97509 Non-pressure chronic ulcer of other part of unspecified foot with unspecified severity: Secondary | ICD-10-CM | POA: Diagnosis present

## 2017-01-18 DIAGNOSIS — E8889 Other specified metabolic disorders: Secondary | ICD-10-CM | POA: Diagnosis present

## 2017-01-18 DIAGNOSIS — M216X2 Other acquired deformities of left foot: Secondary | ICD-10-CM | POA: Diagnosis present

## 2017-01-18 DIAGNOSIS — Z7982 Long term (current) use of aspirin: Secondary | ICD-10-CM

## 2017-01-18 DIAGNOSIS — Z992 Dependence on renal dialysis: Secondary | ICD-10-CM

## 2017-01-18 DIAGNOSIS — Z951 Presence of aortocoronary bypass graft: Secondary | ICD-10-CM

## 2017-01-18 DIAGNOSIS — I132 Hypertensive heart and chronic kidney disease with heart failure and with stage 5 chronic kidney disease, or end stage renal disease: Secondary | ICD-10-CM | POA: Diagnosis present

## 2017-01-18 DIAGNOSIS — Z87891 Personal history of nicotine dependence: Secondary | ICD-10-CM

## 2017-01-18 DIAGNOSIS — I771 Stricture of artery: Secondary | ICD-10-CM | POA: Diagnosis present

## 2017-01-18 DIAGNOSIS — T148XXA Other injury of unspecified body region, initial encounter: Secondary | ICD-10-CM | POA: Diagnosis not present

## 2017-01-18 DIAGNOSIS — E118 Type 2 diabetes mellitus with unspecified complications: Secondary | ICD-10-CM | POA: Diagnosis present

## 2017-01-18 DIAGNOSIS — N2581 Secondary hyperparathyroidism of renal origin: Secondary | ICD-10-CM | POA: Diagnosis present

## 2017-01-18 DIAGNOSIS — L02619 Cutaneous abscess of unspecified foot: Secondary | ICD-10-CM

## 2017-01-18 DIAGNOSIS — M79606 Pain in leg, unspecified: Secondary | ICD-10-CM | POA: Diagnosis present

## 2017-01-18 DIAGNOSIS — E1165 Type 2 diabetes mellitus with hyperglycemia: Secondary | ICD-10-CM | POA: Diagnosis present

## 2017-01-18 DIAGNOSIS — Z79899 Other long term (current) drug therapy: Secondary | ICD-10-CM

## 2017-01-18 DIAGNOSIS — R0602 Shortness of breath: Secondary | ICD-10-CM | POA: Diagnosis present

## 2017-01-18 HISTORY — DX: Dependence on renal dialysis: Z99.2

## 2017-01-18 HISTORY — DX: End stage renal disease: N18.6

## 2017-01-18 LAB — CBC WITH DIFFERENTIAL/PLATELET
BASOS ABS: 0 10*3/uL (ref 0.0–0.1)
BASOS PCT: 0 %
EOS ABS: 0.3 10*3/uL (ref 0.0–0.7)
Eosinophils Relative: 4 %
HCT: 45.7 % (ref 39.0–52.0)
Hemoglobin: 15.1 g/dL (ref 13.0–17.0)
LYMPHS PCT: 25 %
Lymphs Abs: 1.5 10*3/uL (ref 0.7–4.0)
MCH: 30.7 pg (ref 26.0–34.0)
MCHC: 33 g/dL (ref 30.0–36.0)
MCV: 92.9 fL (ref 78.0–100.0)
MONO ABS: 0.6 10*3/uL (ref 0.1–1.0)
Monocytes Relative: 9 %
Neutro Abs: 3.6 10*3/uL (ref 1.7–7.7)
Neutrophils Relative %: 62 %
PLATELETS: 133 10*3/uL — AB (ref 150–400)
RBC: 4.92 MIL/uL (ref 4.22–5.81)
RDW: 14.3 % (ref 11.5–15.5)
WBC: 5.9 10*3/uL (ref 4.0–10.5)

## 2017-01-18 LAB — COMPREHENSIVE METABOLIC PANEL
ALBUMIN: 3.7 g/dL (ref 3.5–5.0)
ALK PHOS: 97 U/L (ref 38–126)
ALT: 27 U/L (ref 17–63)
AST: 31 U/L (ref 15–41)
Anion gap: 11 (ref 5–15)
BILIRUBIN TOTAL: 1 mg/dL (ref 0.3–1.2)
BUN: 14 mg/dL (ref 6–20)
CALCIUM: 8.8 mg/dL — AB (ref 8.9–10.3)
CO2: 31 mmol/L (ref 22–32)
Chloride: 95 mmol/L — ABNORMAL LOW (ref 101–111)
Creatinine, Ser: 3.1 mg/dL — ABNORMAL HIGH (ref 0.61–1.24)
GFR calc Af Amer: 24 mL/min — ABNORMAL LOW (ref >60)
GFR, EST NON AFRICAN AMERICAN: 20 mL/min — AB (ref >60)
Glucose, Bld: 86 mg/dL (ref 65–99)
POTASSIUM: 3.5 mmol/L (ref 3.5–5.1)
Sodium: 137 mmol/L (ref 135–145)
TOTAL PROTEIN: 7.3 g/dL (ref 6.5–8.1)

## 2017-01-18 LAB — CBG MONITORING, ED: Glucose-Capillary: 143 mg/dL — ABNORMAL HIGH (ref 65–99)

## 2017-01-18 LAB — GLUCOSE, CAPILLARY: Glucose-Capillary: 124 mg/dL — ABNORMAL HIGH (ref 65–99)

## 2017-01-18 LAB — MRSA PCR SCREENING: MRSA BY PCR: NEGATIVE

## 2017-01-18 MED ORDER — CLOPIDOGREL BISULFATE 75 MG PO TABS
75.0000 mg | ORAL_TABLET | Freq: Every day | ORAL | Status: DC
  Administered 2017-01-19 – 2017-01-21 (×3): 75 mg via ORAL
  Filled 2017-01-18 (×3): qty 1

## 2017-01-18 MED ORDER — HEPARIN SODIUM (PORCINE) 5000 UNIT/ML IJ SOLN
5000.0000 [IU] | Freq: Three times a day (TID) | INTRAMUSCULAR | Status: DC
  Administered 2017-01-18 – 2017-01-21 (×8): 5000 [IU] via SUBCUTANEOUS
  Filled 2017-01-18 (×7): qty 1

## 2017-01-18 MED ORDER — PIPERACILLIN-TAZOBACTAM 3.375 G IVPB 30 MIN
3.3750 g | Freq: Once | INTRAVENOUS | Status: AC
  Administered 2017-01-18: 3.375 g via INTRAVENOUS
  Filled 2017-01-18: qty 50

## 2017-01-18 MED ORDER — ATORVASTATIN CALCIUM 80 MG PO TABS
80.0000 mg | ORAL_TABLET | Freq: Every day | ORAL | Status: DC
  Administered 2017-01-19 – 2017-01-20 (×2): 80 mg via ORAL
  Filled 2017-01-18 (×3): qty 1

## 2017-01-18 MED ORDER — INSULIN PUMP
Freq: Three times a day (TID) | SUBCUTANEOUS | Status: DC
  Filled 2017-01-18: qty 1

## 2017-01-18 MED ORDER — ASPIRIN 81 MG PO CHEW
81.0000 mg | CHEWABLE_TABLET | Freq: Every day | ORAL | Status: DC
  Administered 2017-01-19 – 2017-01-21 (×3): 81 mg via ORAL
  Filled 2017-01-18 (×3): qty 1

## 2017-01-18 MED ORDER — VANCOMYCIN HCL IN DEXTROSE 1-5 GM/200ML-% IV SOLN
1000.0000 mg | Freq: Once | INTRAVENOUS | Status: AC
  Administered 2017-01-18: 1000 mg via INTRAVENOUS
  Filled 2017-01-18: qty 200

## 2017-01-18 MED ORDER — METOPROLOL SUCCINATE ER 25 MG PO TB24
12.5000 mg | ORAL_TABLET | Freq: Every day | ORAL | Status: DC
  Administered 2017-01-19 – 2017-01-21 (×3): 12.5 mg via ORAL
  Filled 2017-01-18 (×3): qty 1

## 2017-01-18 MED ORDER — PANTOPRAZOLE SODIUM 40 MG PO TBEC
40.0000 mg | DELAYED_RELEASE_TABLET | Freq: Every day | ORAL | Status: DC
  Administered 2017-01-19 – 2017-01-21 (×3): 40 mg via ORAL
  Filled 2017-01-18 (×3): qty 1

## 2017-01-18 MED ORDER — EZETIMIBE 10 MG PO TABS
10.0000 mg | ORAL_TABLET | Freq: Every day | ORAL | Status: DC
  Administered 2017-01-19 – 2017-01-21 (×3): 10 mg via ORAL
  Filled 2017-01-18 (×3): qty 1

## 2017-01-18 MED ORDER — VANCOMYCIN HCL IN DEXTROSE 750-5 MG/150ML-% IV SOLN
750.0000 mg | INTRAVENOUS | Status: DC
  Administered 2017-01-20: 750 mg via INTRAVENOUS
  Filled 2017-01-18: qty 150

## 2017-01-18 MED ORDER — ACETAMINOPHEN 500 MG PO TABS
1000.0000 mg | ORAL_TABLET | Freq: Four times a day (QID) | ORAL | Status: DC | PRN
  Administered 2017-01-20: 1000 mg via ORAL
  Filled 2017-01-18: qty 2

## 2017-01-18 MED ORDER — AMLODIPINE BESYLATE 10 MG PO TABS
10.0000 mg | ORAL_TABLET | Freq: Every day | ORAL | Status: DC
  Administered 2017-01-18 – 2017-01-20 (×3): 10 mg via ORAL
  Filled 2017-01-18 (×3): qty 1

## 2017-01-18 MED ORDER — HYDRALAZINE HCL 50 MG PO TABS
50.0000 mg | ORAL_TABLET | Freq: Two times a day (BID) | ORAL | Status: DC
  Administered 2017-01-19 – 2017-01-21 (×5): 50 mg via ORAL
  Filled 2017-01-18 (×6): qty 1

## 2017-01-18 MED ORDER — INSULIN PUMP
Freq: Three times a day (TID) | SUBCUTANEOUS | Status: DC
  Administered 2017-01-18: 3 via SUBCUTANEOUS
  Administered 2017-01-19: 1.5 via SUBCUTANEOUS
  Administered 2017-01-19 – 2017-01-20 (×5): via SUBCUTANEOUS
  Administered 2017-01-21: 2 via SUBCUTANEOUS
  Administered 2017-01-21: 13:00:00 via SUBCUTANEOUS
  Filled 2017-01-18: qty 1

## 2017-01-18 NOTE — ED Provider Notes (Signed)
[facility] DEPT Provider Note   CSN: [id number] Arrival date & time: 01/18/17  1151   By signing my name below, I, Kenneth [name], attest that this documentation has been prepared under the direction and in the presence of Eliezer Mccoy, PA-C. Electronically Signed: Hilbert [name], Scribe. 01/18/17. 3:21 PM. History   Chief Complaint Chief Complaint  Patient presents with  . Wound Infection    right foot    The history is provided by the patient. No language interpreter was used.  HPI Comments: Kenneth [name] is a 61 y.o. male with hx of diabetes who presents to the Emergency Department complaining of a worsening infection in his 2nd and 3rd toe of his right foot over the past few days. He states that he was seen by his podiatrist 4 days ago and was given cream for his foot. He is unsure of whether it was an antibiotic cream. He reports current redness, pain, and drainage on his right foot radiating up his leg to below his knee. He also reports having subjective fever and increased SOB recently. He is currently on dialysis Monday, Wednesday, and Friday for ESRD. He denies nausea, vomiting, chest pain, or abdominal pain.  Past Medical History:  Diagnosis Date  . Acute pulmonary edema ([place]) 05/31/2016  . Acute respiratory failure with hypoxemia ([place]) 05/31/2016  . AKI (acute kidney injury) ([place])   . CHF (congestive heart failure) ([place])   . 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 HCAP (healthcare-associated pneumonia)   . Ischemic rest pain of lower extremity ([place]) 02/06/2015  . 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 NSTEMI (non-ST elevated myocardial infarction) ([place])   . Pain in lower limb 02/06/2015  . Pain in the chest   . Pleural effusion   . 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 Shortness of breath     Patient Active Problem List   Diagnosis Date Noted  . Cellulitis in diabetic foot ([place]) 01/18/2017  . Chest pain 12/16/2016  . Abdominal pain  12/16/2016  . Hypertensive urgency 12/16/2016  . Pressure injury of skin 11/03/2016  . DM (diabetes mellitus), type 2 with renal complications ([place]) 49/44/9675  . Elevated troponin 10/31/2016  . ESRD (end stage renal disease) ([place]) 10/30/2016  . Acute encephalopathy 10/30/2016  . Uremia 10/30/2016  . CAD (coronary artery disease) 10/30/2016  . 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 Chronic systolic CHF (congestive heart failure) ([place])   . AKI (acute kidney injury) ([place])   . HCAP (healthcare-associated pneumonia)   . S/P thoracentesis   . Pleural effusion   . Acute pulmonary edema ([place]) 05/31/2016  . Acute respiratory failure with hypoxemia ([place]) 05/31/2016  . Non-ST elevation (NSTEMI) myocardial infarction ([place])   . Shortness of breath   . Pain in the chest   . 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 Pain in lower limb 02/06/2015  . Ischemic rest pain of lower extremity ([place]) 02/06/2015    Past Surgical History:  Procedure Laterality Date  . AV FISTULA PLACEMENT Left 06/22/2016   Procedure: ARTERIOVENOUS (AV) FISTULA CREATION LEFT UPPER ARM;  Surgeon: Rosetta Posner, MD;  Location: [facility];  Service: Vascular;  Laterality: Left;  . CARDIAC CATHETERIZATION N/A 05/31/2016   Procedure: Left Heart Cath and Coronary Angiography;  Surgeon: Jettie Booze, MD;  Location: [facility];  Service: Cardiovascular;  Laterality: N/A;  . CARDIAC  CATHETERIZATION N/A 05/31/2016   Procedure: Right Heart Cath;  Surgeon: Jettie Booze, MD;  Location: [facility];  Service: Cardiovascular;  Laterality: N/A;  . CARDIAC CATHETERIZATION N/A 05/31/2016   Procedure: IABP Insertion;  Surgeon: Jettie Booze, MD;  Location: [facility];  Service: Cardiovascular;  Laterality: N/A;  . CORONARY ARTERY BYPASS GRAFT N/A 06/05/2016   Procedure: CORONARY ARTERY BYPASS GRAFTING (CABG) x3  LIMA to LAD -SVG to OM -SVG to RCA;  Surgeon: Ivin Poot, MD;  Location: [facility];  Service: Open Heart Surgery;  Laterality: N/A;  . INSERTION OF DIALYSIS CATHETER N/A 06/05/2016   Procedure: INSERTION OF DIALYSIS/trialysis CATHETER;  Surgeon: Ivin Poot, MD;  Location: [facility];  Service: Vascular;  Laterality: N/A;  . INSERTION OF DIALYSIS CATHETER Right 06/13/2016   Procedure: INSERTION OF DIALYSIS CATHETER RIGHT INTERNAL JUGULAR;  Surgeon: Conrad Marshallberg, MD;  Location: [facility];  Service: Vascular;  Laterality: Right;  . INTRAOPERATIVE TRANSESOPHAGEAL ECHOCARDIOGRAM N/A 06/05/2016   Procedure: INTRAOPERATIVE TRANSESOPHAGEAL ECHOCARDIOGRAM;  Surgeon: Ivin Poot, MD;  Location: [facility];  Service: Open Heart Surgery;  Laterality: N/A;  . LEFT HEART CATH AND CORS/GRAFTS ANGIOGRAPHY N/A 11/03/2016   Procedure: Left Heart Cath and Cors/Grafts Angiography;  Surgeon: Troy Sine, MD;  Location: [facility];  Service: Cardiovascular;  Laterality: N/A;       Home Medications    Prior to Admission medications   Medication Sig Start Date End Date Taking? Authorizing Provider  albuterol (PROVENTIL HFA;VENTOLIN HFA) 108 (90 Base) MCG/ACT inhaler Inhale 2 puffs into the lungs every 6 (six) hours as needed for wheezing.    [provider]  amLODipine (NORVASC) 10 MG tablet Take 1 tablet (10 mg total) by mouth at bedtime. 12/17/16   Hongalgi, Lenis Dickinson, MD  aspirin 81 MG chewable tablet Chew 1 tablet (81 mg total) by mouth daily. 11/06/16   Caren Griffins, MD  atorvastatin (LIPITOR) 80 MG tablet Take 1 tablet (80 mg total) by mouth daily. 09/22/16 12/24/20  Larey Dresser, MD  ciprofloxacin (CILOXAN) 0.3 % ophthalmic solution Place 1 drop into the right eye 4 (four) times daily.    [provider]  clopidogrel (PLAVIX) 75 MG tablet Take 1 tablet (75 mg total) by mouth daily. 01/12/17   Bensimhon, Shaune Pascal, MD  ezetimibe (ZETIA) 10 MG tablet Take 1 tablet (10 mg total) by mouth  daily. 09/22/16 12/24/20  Larey Dresser, MD  hydrALAZINE (APRESOLINE) 50 MG tablet Take 1 tablet (50 mg total) by mouth 2 (two) times daily. 12/17/16   Hongalgi, Lenis Dickinson, MD  Insulin Human (INSULIN PUMP) SOLN Inject into the skin continuous. Novolog    [provider]  ketorolac (ACULAR) 0.5 % ophthalmic solution Place 1 drop into the right eye 4 (four) times daily.    [provider]  metoprolol succinate (TOPROL-XL) 25 MG 24 hr tablet Take 0.5 tablets (12.5 mg total) by mouth at bedtime. 11/05/16   Caren Griffins, MD  prednisoLONE acetate (PRED FORTE) 1 % ophthalmic suspension Place 1 drop into the right eye 4 (four) times daily.    [provider]  vitamin C (ASCORBIC ACID) 500 MG tablet Take 1,000 mg by mouth daily.    [provider]    Family History Family History  Problem Relation Age of Onset  . CAD Father   . Colon cancer Father   . Diabetes Brother     Social History Social  History  Substance Use Topics  . Smoking status: Former Research scientist (life sciences)  . Smokeless tobacco: Never Used  . Alcohol use No     Allergies   Hydralazine   Review of Systems Review of Systems  Constitutional: Positive for fever.  Respiratory: Positive for shortness of breath.   Cardiovascular: Negative for chest pain.  Gastrointestinal: Negative for abdominal pain, nausea and vomiting.  Musculoskeletal: Positive for myalgias.       Right foot pain     Physical Exam Updated Vital Signs BP 137/62 (BP Location: Right Arm)   Pulse 75   Temp 98.4 F (36.9 C) (Oral)   Resp 16   SpO2 98%   Physical Exam  Constitutional: He appears well-developed and well-nourished. No distress.  HENT:  Head: Normocephalic and atraumatic.  Mouth/Throat: Oropharynx is clear and moist. No oropharyngeal exudate.  Eyes: Conjunctivae are normal. Pupils are equal, round, and reactive to light. Right eye exhibits no discharge. Left eye exhibits no discharge. No scleral icterus.  Neck:  Normal range of motion. Neck supple. No thyromegaly present.  Cardiovascular: Normal rate, regular rhythm, normal heart sounds and intact distal pulses.  Exam reveals no gallop and no friction rub.   No murmur heard. Pulmonary/Chest: Effort normal and breath sounds normal. No stridor. No respiratory distress. He has no wheezes. He has no rales.  Abdominal: Soft. Bowel sounds are normal. He exhibits no distension. There is no tenderness. There is no rebound and no guarding.  Musculoskeletal: He exhibits no edema.  Wound to second and third right toes, active drainage, surrounding cellulitis extending to proximal foot and sole of foot; tenderness extending to leg up to distal knee; pulses intact; normal sensation  Lymphadenopathy:    He has no cervical adenopathy.  Neurological: He is alert. Coordination normal.  Skin: Skin is warm and dry. No rash noted. He is not diaphoretic. No pallor.  Psychiatric: He has a normal mood and affect.  Nursing note and vitals reviewed.      ED Treatments / Results  DIAGNOSTIC STUDIES: Oxygen Saturation is 98% on RA, normal by my interpretation.    COORDINATION OF CARE: 3:03 PM Discussed treatment plan with pt at bedside which includes labs and pt agreed to plan.  Labs (all labs ordered are listed, but only abnormal results are displayed) Labs Reviewed  COMPREHENSIVE METABOLIC PANEL - Abnormal; Notable for the following:       Result Value   Chloride 95 (*)    Creatinine, Ser 3.10 (*)    Calcium 8.8 (*)    GFR calc non Af Amer 20 (*)    GFR calc Af Amer 24 (*)    All other components within normal limits  CBC WITH DIFFERENTIAL/PLATELET - Abnormal; Notable for the following:    Platelets 133 (*)    All other components within normal limits  AEROBIC CULTURE (SUPERFICIAL SPECIMEN)    EKG  EKG Interpretation None       Radiology No results found.  Procedures Procedures (including critical care time)  Medications Ordered in  ED Medications  piperacillin-tazobactam (ZOSYN) IVPB 3.375 g (3.375 g Intravenous New Bag/Given 01/18/17 1538)  vancomycin (VANCOCIN) IVPB 1000 mg/200 mL premix (not administered)     Initial Impression / Assessment and Plan / ED Course  I have reviewed the triage vital signs and the nursing notes.  Pertinent labs & imaging results that were available during my care of the patient were reviewed by me and considered in my medical decision making (see chart  for details).     Patient with significantly infection with cellulitis surrounding, and pain extending to below right knee. CBC shows platelets 133. CMP shows creatinine 3.10, calcium 8.8.Wound culture pending. X-ray of right foot pending, as well as chest x-ray. I consulted internal medicine teaching service who will admit the patient to [facility]. Vancomycin and Zosyn initiated in the ED. Patient family understand and agree with plan. Patient vitals stable throughout ED course. Patient also evaluated by Dr. Ashok Cordia who guided the patient's management and agrees with plan.  Final Clinical Impressions(s) / ED Diagnoses   Final diagnoses:  Wound infection  Cellulitis of right lower extremity    New Prescriptions New Prescriptions   No medications on file   I personally performed the services described in this documentation, which was scribed in my presence. The recorded information has been reviewed and is accurate.    Frederica Kuster, PA-C 01/18/17 1546    Lajean Saver, MD 01/25/17 [phone]

## 2017-01-18 NOTE — ED Notes (Addendum)
Report attempted x 1

## 2017-01-18 NOTE — H&P (Signed)
Date: 01/18/2017               Patient Name:  Kenneth [name] MRN: [id number]  DOB: Sep 21, 1955 Age / Sex: 61 y.o., male   PCP: Kenneth Holstein, MD         Medical Service: Internal Medicine Teaching Service         Attending Physician: Dr. Evette Doffing, Mallie Mussel, *    First Contact: Dr. Einar Gip, DO Pager: [phone]  Second Contact: Dr. Ignacia Marvel, MD Pager: [phone]       After Hours (After 5p/  First Contact Pager: [phone]  weekends / holidays): Second Contact Pager: [phone]   Chief Complaint: right foot pain  History of Present Illness:  Kenneth [name] is a Korean-speaking 61 y/o M with Mhx significant for ESRD on HD MWF, DM2 with insulin pump, CAD s/p CABG x3, diastolic CHF with hx of HFrEF, HTN and HLD here for evaluation of right foot pain. Noticed a blister on dorsal aspect of right foot 01/13/17 which he popped. Since that time he's had increasing pain and erythema. Saw his podiatrist 4 days ago who prescribed Amerigel ointment following wound cleansing with H2O2 however presents today due to worsening pain and redness. He's had subjective fevers at home as well as generalized malaise.   His wife reports that he has history of reduced blood flow to his right lower extremity several years ago however this was treated with medication (unsure of name). This medicine had to be discontinued 05/2016 after NSTEMI.  In the ED VSS (Temp 98.4*, pulse 75, BP 137/62 and he was saturating 98% on RA). CMET and CBC wnl except for changes c/w ESRD. Plain film obtained which was negative for soft tissue or bony abnormality. He was subsequently started on Vancomycin and Zosyn and admitted to IMTS for management of cellulitis.   Meds:  Current Meds  Medication Sig  . acetaminophen (TYLENOL) 500 MG tablet Take 1,000 mg by mouth every 6 (six) hours as needed for headache (pain).  Marland Kitchen amLODipine (NORVASC) 10 MG tablet Take 1 tablet (10 mg total) by mouth at bedtime.  Marland Kitchen aspirin 81 MG chewable tablet Chew  1 tablet (81 mg total) by mouth daily.  Marland Kitchen atorvastatin (LIPITOR) 80 MG tablet Take 1 tablet (80 mg total) by mouth daily. (Patient taking differently: Take 80 mg by mouth at bedtime. )  . clopidogrel (PLAVIX) 75 MG tablet Take 1 tablet (75 mg total) by mouth daily. (Patient taking differently: Take 75 mg by mouth daily with lunch. )  . ezetimibe (ZETIA) 10 MG tablet Take 1 tablet (10 mg total) by mouth daily. (Patient taking differently: Take 10 mg by mouth daily with lunch. )  . hydrALAZINE (APRESOLINE) 50 MG tablet Take 1 tablet (50 mg total) by mouth 2 (two) times daily.  . Insulin Human (INSULIN PUMP) SOLN Inject into the skin continuous. Novolog  . metoprolol succinate (TOPROL-XL) 25 MG 24 hr tablet Take 0.5 tablets (12.5 mg total) by mouth at bedtime. (Patient taking differently: Take 12.5 mg by mouth daily. )  . pantoprazole (PROTONIX) 40 MG tablet Take 40 mg by mouth daily with lunch.  . vitamin C (ASCORBIC ACID) 500 MG tablet Take 1,000 mg by mouth daily.   Allergies: Allergies as of 01/18/2017 - Review Complete 01/18/2017  Allergen Reaction Noted  . Hydralazine Rash 06/11/2016   Past Medical History:  Diagnosis Date  . Acute pulmonary edema ([place]) 05/31/2016  . Acute respiratory failure with  hypoxemia ([place]) 05/31/2016  . AKI (acute kidney injury) ([place])   . CHF (congestive heart failure) ([place])   . 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[place]) 02/06/2015  . HCAP (healthcare-associated pneumonia)   . Ischemic rest pain of lower extremity ([place]) 02/06/2015  . 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 NSTEMI (non-ST elevated myocardial infarction) ([place])   . Pain in lower limb 02/06/2015  . Pain in the chest   . Pleural effusion   . 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 Shortness of breath    Family History: Father with history of colon cancer and CAD. Brother with Dm2  Social History: Former smoker. Denies any alcohol or drug use.   Review of Systems: A complete ROS was  negative except as per HPI.   Physical Exam: Blood pressure 137/62, pulse 75, temperature 98.4 F (36.9 C), temperature source Oral, resp. rate 16, SpO2 98 %. General: In no acute distress. Resting comfortably in bed. Sleeping during most of interview HENT: PERRL. EOMI. No conjunctival injection, icterus or ptosis. Oropharynx clear, mucous membranes moist.  Cardiovascular: Regular rate and rhythm. No murmur or rub appreciated. Pulmonary: CTA BL, no wheezing, crackles or rhonchi appreciated. Unlabored breathing.  Abdomen: Soft, non-tender and non-distended. No guarding or rigidity. +bowel sounds.  Extremities: Skin of BL LE smooth, shiny and without hair until mid-calf. Diminished DP/PT pulses BL. RLE with multiple ulcerations. Eschar. Not draining purulent material. Surrounding erythema. Picture below. Skin: Warm, dry. No cyanosis. No rashes appreciated on chest/abdomen or back Neuro: Strength and sensation grossly intact.  Psych: Pleasant. Mood normal and affect was mood congruent. Responds to questions appropriately.    Plain Film Right Foot: No bony or soft-tissue abnormality.   Assessment & Plan by Problem: Principal Problem:   Cellulitis in diabetic foot ([place]) Active Problems:   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HCC)   Ischemic rest pain of lower extremity (HCC)   Pronation deformity of both feet   Shortness of breath   CHF (congestive heart failure), NYHA class I, chronic, systolic (HCC)   ESRD (end stage renal disease) (HCC)   CAD (coronary artery disease)   DM (diabetes mellitus), type 2 with renal complications (HCC)  Right Foot Cellulitis in setting of Diabetes and ?PAD: Plain film without bony or soft-tissue involvement. He is afebrile here and without leukocytosis. Denies any prior history of skin infections. MRSA screen historically negative however does have hx of ESRD and DM with frequent healthcare exposures. Received Vancomycin and Zosyn in ED. Wound gram stain with GP cocci  in pairs, indicating staph spp.  -Follow fever and WBC trends -Vanc will be in system til next HD session 5/9.  -Pain control currently with tylenol -FU wound culture and sensitivities however wound swabs not particularly reliable  HTN: He is on Hydral 50 mg TID, Amlodipine 10 mg and Toprol xl 12.5 mg daily. Blood pressure controlled so far on this regimen and will be continued. Continue to monitor.  HLD: Atorvastatin 80mg , Zetia 10 mg  ESRD on HD MWF: Secondary to DM2. Last session today 5/7 which was completed Consult nephro should pt remain admitted 5/9.  Type 2 Diabetes: Most recent HbA1c 7.7% 10/2016. On Humalin insulin pump which will be continued here per insulin pump protocol.  -HbA1c  HFpEF, History of HFrEF: Most recent echo 12/2016 with LVEF 32-67%, grade 1 diastolic dysfunction however ECHO 05/2016 with LVEF 35-40% and grade 2 diastolic dysfunction. Volume status maintained with HD.  CAD s/p CABG x3 with Hx NSTEMI: Denies any CP,  SOB or diaphoresis. Most recent [place] 11/03/16 with significant native CAD with 30% left main stenosis, diffuse 90% proximal to mid LAD stenosis with 95% diffuse stenosis of diagonal vessel. Patient grafts with mild 20% narrowing.  He denies any CP or SOB. -Continue statin, ASA, plavix  Dispo: Admit patient to Inpatient with expected length of stay greater than 2 midnights.  SignedEinar Gip, DO 01/18/2017, 3:45 PM  Pager: [phone]

## 2017-01-18 NOTE — ED Triage Notes (Signed)
Pt went to foot MD on Thursday morning for right 2nd- and third toes and they gave him cream for it and now has redness and pain with drainage.  Pt is alert and diabetic.  He has dialysis and came from site. Concern for infection.

## 2017-01-18 NOTE — Progress Notes (Signed)
Admission note:  Arrival Method: patient arrived from ED on a stretcher, accompanied by the wife. Mental Orientation:  Alert and oriented x 4. Telemetry: N/A Assessment: See doc flow sheets. Skin: Warm, dry and intact except for cellulitis on the left foot which is marked, redness and warm to touch. IV: Right forearm SL. Pain: Denies any pain currently. Tubes: N/A Safety Measures: bed in low position, call bell and phone within reach. Fall Prevention Safety Plan: Reviewed the plan with the patient , understood and acknowledged. Admission Screening: In progress. 6700 Orientation: Patient has been oriented to the unit, staff and to the room.

## 2017-01-18 NOTE — ED Notes (Signed)
Report attempted x2. Requested 5 more minutes. Advised that if unable to take report after 5 minutes bedside report would have to be given.

## 2017-01-18 NOTE — Progress Notes (Signed)
Pharmacy Antibiotic Note  Kenneth [name] is a 61 y.o. male admitted on 01/18/2017 with cellulitis.  Pharmacy has been consulted for vancomycin dosing. Patient has history of ESRD on HD MWF. Patient received vancomycin 1g and zosyn 3.375g IV once in the ED.  Plan: Vancomycin 750mg  IV qHD  Monitor culture data, renal function and clinical course VT at SS prn     Temp (24hrs), Avg:98.4 F (36.9 C), Min:98.4 F (36.9 C), Max:98.4 F (36.9 C)   Recent Labs Lab 01/18/17 1249  WBC 5.9  CREATININE 3.10*    CrCl cannot be calculated (Unknown ideal weight.).    Allergies  Allergen Reactions  . Hydralazine Rash    Pt has been tolerating this medication at home     [facility]. Diona Foley, PharmD, BCPS Clinical Pharmacist [phone] 01/18/2017 3:35 PM

## 2017-01-19 ENCOUNTER — Inpatient Hospital Stay (HOSPITAL_COMMUNITY): Payer: Medicare Other

## 2017-01-19 DIAGNOSIS — E11628 Type 2 diabetes mellitus with other skin complications: Secondary | ICD-10-CM

## 2017-01-19 DIAGNOSIS — L03031 Cellulitis of right toe: Secondary | ICD-10-CM

## 2017-01-19 DIAGNOSIS — N186 End stage renal disease: Secondary | ICD-10-CM

## 2017-01-19 DIAGNOSIS — Z951 Presence of aortocoronary bypass graft: Secondary | ICD-10-CM

## 2017-01-19 DIAGNOSIS — Z7982 Long term (current) use of aspirin: Secondary | ICD-10-CM

## 2017-01-19 DIAGNOSIS — E11621 Type 2 diabetes mellitus with foot ulcer: Secondary | ICD-10-CM

## 2017-01-19 DIAGNOSIS — L97519 Non-pressure chronic ulcer of other part of right foot with unspecified severity: Secondary | ICD-10-CM

## 2017-01-19 DIAGNOSIS — E1122 Type 2 diabetes mellitus with diabetic chronic kidney disease: Secondary | ICD-10-CM

## 2017-01-19 DIAGNOSIS — Z992 Dependence on renal dialysis: Secondary | ICD-10-CM

## 2017-01-19 DIAGNOSIS — I2510 Atherosclerotic heart disease of native coronary artery without angina pectoris: Secondary | ICD-10-CM

## 2017-01-19 DIAGNOSIS — Z794 Long term (current) use of insulin: Secondary | ICD-10-CM

## 2017-01-19 DIAGNOSIS — Z9641 Presence of insulin pump (external) (internal): Secondary | ICD-10-CM

## 2017-01-19 DIAGNOSIS — E1151 Type 2 diabetes mellitus with diabetic peripheral angiopathy without gangrene: Secondary | ICD-10-CM

## 2017-01-19 DIAGNOSIS — Z79899 Other long term (current) drug therapy: Secondary | ICD-10-CM

## 2017-01-19 DIAGNOSIS — Z7902 Long term (current) use of antithrombotics/antiplatelets: Secondary | ICD-10-CM

## 2017-01-19 LAB — GLUCOSE, CAPILLARY
GLUCOSE-CAPILLARY: 99 mg/dL (ref 65–99)
GLUCOSE-CAPILLARY: 176 mg/dL — AB (ref 65–99)
GLUCOSE-CAPILLARY: 153 mg/dL — AB (ref 65–99)
GLUCOSE-CAPILLARY: 120 mg/dL — AB (ref 65–99)
Glucose-Capillary: 239 mg/dL — ABNORMAL HIGH (ref 65–99)

## 2017-01-19 LAB — BASIC METABOLIC PANEL
ANION GAP: 12 (ref 5–15)
BUN: 25 mg/dL — ABNORMAL HIGH (ref 6–20)
CHLORIDE: 93 mmol/L — AB (ref 101–111)
CO2: 31 mmol/L (ref 22–32)
Calcium: 9 mg/dL (ref 8.9–10.3)
Creatinine, Ser: 4.56 mg/dL — ABNORMAL HIGH (ref 0.61–1.24)
GFR calc Af Amer: 15 mL/min — ABNORMAL LOW (ref >60)
GFR, EST NON AFRICAN AMERICAN: 13 mL/min — AB (ref >60)
GLUCOSE: 78 mg/dL (ref 65–99)
POTASSIUM: 4.1 mmol/L (ref 3.5–5.1)
Sodium: 136 mmol/L (ref 135–145)

## 2017-01-19 LAB — SEDIMENTATION RATE: Sed Rate: 12 mm/hr (ref 0–16)

## 2017-01-19 LAB — CBC
HCT: 45 % (ref 39.0–52.0)
HEMOGLOBIN: 14.7 g/dL (ref 13.0–17.0)
MCH: 30.6 pg (ref 26.0–34.0)
MCHC: 32.7 g/dL (ref 30.0–36.0)
MCV: 93.6 fL (ref 78.0–100.0)
PLATELETS: 152 10*3/uL (ref 150–400)
RBC: 4.81 MIL/uL (ref 4.22–5.81)
RDW: 14 % (ref 11.5–15.5)
WBC: 7.1 10*3/uL (ref 4.0–10.5)

## 2017-01-19 LAB — C-REACTIVE PROTEIN: CRP: 2.8 mg/dL — ABNORMAL HIGH (ref <1.0)

## 2017-01-19 LAB — HEMOGLOBIN A1C
HEMOGLOBIN A1C: 7.1 % — AB (ref 4.8–5.6)
Mean Plasma Glucose: 157 mg/dL

## 2017-01-19 MED ORDER — VANCOMYCIN HCL 500 MG IV SOLR
500.0000 mg | Freq: Once | INTRAVENOUS | Status: AC
  Administered 2017-01-19: 500 mg via INTRAVENOUS
  Filled 2017-01-19: qty 500

## 2017-01-19 MED ORDER — PIPERACILLIN-TAZOBACTAM 3.375 G IVPB
3.3750 g | Freq: Two times a day (BID) | INTRAVENOUS | Status: DC
  Administered 2017-01-19 – 2017-01-21 (×5): 3.375 g via INTRAVENOUS
  Filled 2017-01-19 (×5): qty 50

## 2017-01-19 MED FILL — Insulin Aspart Inj 100 Unit/ML: SUBCUTANEOUS | Qty: 10 | Status: AC

## 2017-01-19 NOTE — Consult Note (Signed)
[facility] Renal Consultation Note    Indication for Consultation:  Management of ESRD/hemodialysis, anemia, hypertension/volume, and secondary hyperparathyroidism. PCP:  HPI: Kenneth [name] is a 61 y.o. male with ESRD, CAD (s/p CABG), Type 2 DM, and hyperlipidemia who was admitted with R foot cellulitis.   Noted blister on his foot last week which he popped, since then wound has developed/worsened and he has had fever/chills at home which prompted ED evaluation. In ED, started on Vanc/Zosyn. Foot xray without signs of osteo. CXR clear. Also c/o headache x past month. Denies CP, dyspnea, abdominal pain, N/V, or diarrhea. Possibly has PAD, unclear if fully evaluated.  From renal standpoint, dialyzes MWF at [facility], last HD 5/7. No issues with L AVF recently.  Past Medical History:  Diagnosis Date  . Acute pulmonary edema ([place]) 05/31/2016  . Acute respiratory failure with hypoxemia ([place]) 05/31/2016  . CHF (congestive heart failure) ([place])   . 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[place]) 02/06/2015  . ESRD (end stage renal disease) on dialysis [facility])    "MWF; [facility]" (01/18/2017)  . HCAP (healthcare-associated pneumonia)   . Ischemic rest pain of lower extremity ([place]) 02/06/2015  . 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 NSTEMI (non-ST elevated myocardial infarction) ([place])   . Pain in lower limb 02/06/2015  . Pain in the chest   . Pleural effusion   . 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 Shortness of breath    Past Surgical History:  Procedure Laterality Date  . AV FISTULA PLACEMENT Left 06/22/2016   Procedure: ARTERIOVENOUS (AV) FISTULA CREATION LEFT UPPER ARM;  Surgeon: Rosetta Posner, MD;  Location: [facility];  Service: Vascular;  Laterality: Left;  . CARDIAC CATHETERIZATION N/A 05/31/2016   Procedure: Left Heart Cath and Coronary Angiography;  Surgeon: Jettie Booze, MD;  Location: [facility];  Service: Cardiovascular;  Laterality: N/A;   . CARDIAC CATHETERIZATION N/A 05/31/2016   Procedure: Right Heart Cath;  Surgeon: Jettie Booze, MD;  Location: [facility];  Service: Cardiovascular;  Laterality: N/A;  . CARDIAC CATHETERIZATION N/A 05/31/2016   Procedure: IABP Insertion;  Surgeon: Jettie Booze, MD;  Location: [facility];  Service: Cardiovascular;  Laterality: N/A;  . CORONARY ARTERY BYPASS GRAFT N/A 06/05/2016   Procedure: CORONARY ARTERY BYPASS GRAFTING (CABG) x3 LIMA to LAD -SVG to OM -SVG to RCA;  Surgeon: Ivin Poot, MD;  Location: [facility];  Service: Open Heart Surgery;  Laterality: N/A;  . INSERTION OF DIALYSIS CATHETER N/A 06/05/2016   Procedure: INSERTION OF DIALYSIS/trialysis CATHETER;  Surgeon: Ivin Poot, MD;  Location: [facility];  Service: Vascular;  Laterality: N/A;  . INSERTION OF DIALYSIS CATHETER Right 06/13/2016   Procedure: INSERTION OF DIALYSIS CATHETER RIGHT INTERNAL JUGULAR;  Surgeon: Conrad , MD;  Location: [facility];  Service: Vascular;  Laterality: Right;  . INTRAOPERATIVE TRANSESOPHAGEAL ECHOCARDIOGRAM N/A 06/05/2016   Procedure: INTRAOPERATIVE TRANSESOPHAGEAL ECHOCARDIOGRAM;  Surgeon: Ivin Poot, MD;  Location: [place];  Service: Open Heart Surgery;  Laterality: N/A;  . LEFT HEART CATH AND CORS/GRAFTS ANGIOGRAPHY N/A 11/03/2016   Procedure: Left Heart Cath and Cors/Grafts Angiography;  Surgeon: Troy Sine, MD;  Location: [place] [facility];  Service: Cardiovascular;  Laterality: N/A;   Family History  Problem Relation Age of Onset  . CAD Father   . Colon cancer Father   . Diabetes Brother    Social History:  reports that he has quit smoking.  He has never used smokeless tobacco. He reports that he does not drink alcohol or use drugs.  ROS: As per HPI otherwise negative.  Physical Exam: Vitals:   01/18/17 1601 01/18/17 1703 01/18/17 2024 01/19/17 0517  BP: (!) 153/66 (!) 147/60 (!) 140/54 (!) 145/60  Pulse: 72 70 73 72  Resp: 14 18 18 18   Temp: 98 F (36.7  C) 98 F (36.7 C) 97.8 F (36.6 C) 98.4 F (36.9 C)  TempSrc: Oral Oral Oral Oral  SpO2: 98% 98% 99% 95%     General: Well developed, well nourished, in no acute distress. Head: Normocephalic, atraumatic, sclera non-icteric, mucus membranes are moist. Neck: Supple without lymphadenopathy/masses.  Lungs: Clear bilaterally to auscultation in upper lobes; faint rales in lower lobes. Heart: RRR; 2/6 systolic murmur. Abdomen: Soft, non-tender, non-distended with normoactive bowel sounds.  Musculoskeletal:  Strength and tone appear normal for age. Lower extremities: RLE with trace edema present around cellulitis dorsal foot and dried wound overlying 2nd/3rd toe bases. LLE without edema. Neuro: Alert and oriented X 3. Moves all extremities spontaneously. Psych:  Responds to questions appropriately with a normal affect. Dialysis Access: LUE AVF + thrill/pulsatile bruit  No Known Allergies Prior to Admission medications   Medication Sig Start Date End Date Taking? Authorizing Provider  acetaminophen (TYLENOL) 500 MG tablet Take 1,000 mg by mouth every 6 (six) hours as needed for headache (pain).   Yes [provider]  amLODipine (NORVASC) 10 MG tablet Take 1 tablet (10 mg total) by mouth at bedtime. 12/17/16  Yes Hongalgi, Lenis Dickinson, MD  aspirin 81 MG chewable tablet Chew 1 tablet (81 mg total) by mouth daily. 11/06/16  Yes Caren Griffins, MD  atorvastatin (LIPITOR) 80 MG tablet Take 1 tablet (80 mg total) by mouth daily. Patient taking differently: Take 80 mg by mouth at bedtime.  09/22/16 12/24/20 Yes Larey Dresser, MD  clopidogrel (PLAVIX) 75 MG tablet Take 1 tablet (75 mg total) by mouth daily. Patient taking differently: Take 75 mg by mouth daily with lunch.  01/12/17  Yes Bensimhon, Shaune Pascal, MD  ezetimibe (ZETIA) 10 MG tablet Take 1 tablet (10 mg total) by mouth daily. Patient taking differently: Take 10 mg by mouth daily with lunch.  09/22/16 12/24/20 Yes Larey Dresser, MD   hydrALAZINE (APRESOLINE) 50 MG tablet Take 1 tablet (50 mg total) by mouth 2 (two) times daily. 12/17/16  Yes Hongalgi, Lenis Dickinson, MD  Insulin Human (INSULIN PUMP) SOLN Inject into the skin continuous. Novolog   Yes [provider]  metoprolol succinate (TOPROL-XL) 25 MG 24 hr tablet Take 0.5 tablets (12.5 mg total) by mouth at bedtime. Patient taking differently: Take 12.5 mg by mouth daily.  11/05/16  Yes Caren Griffins, MD  pantoprazole (PROTONIX) 40 MG tablet Take 40 mg by mouth daily with lunch. 01/07/17  Yes [provider]  vitamin C (ASCORBIC ACID) 500 MG tablet Take 1,000 mg by mouth daily.   Yes [provider]   Current Facility-Administered Medications  Medication Dose Route Frequency Provider Last Rate Last Dose  . acetaminophen (TYLENOL) tablet 1,000 mg  1,000 mg Oral Q6H PRN Collier Salina, MD      . amLODipine (NORVASC) tablet 10 mg  10 mg Oral QHS Collier Salina, MD   10 mg at 01/18/17 2118  . aspirin chewable tablet 81 mg  81 mg Oral Daily Rice, Resa Miner, MD      . atorvastatin (LIPITOR) tablet 80 mg  80 mg Oral QHS Rice, Resa Miner, MD      . clopidogrel (PLAVIX) tablet 75 mg  75 mg Oral Daily Rice, Resa Miner, MD      . ezetimibe (ZETIA) tablet 10 mg  10 mg Oral Q lunch Collier Salina, MD      . heparin injection 5,000 Units  5,000 Units Subcutaneous Q8H Collier Salina, MD   5,000 Units at 01/19/17 0601  . hydrALAZINE (APRESOLINE) tablet 50 mg  50 mg Oral BID Rice, Resa Miner, MD      . insulin pump   Subcutaneous TID AC, HS, 0200 Axel Filler, MD   3 each at 01/18/17 2136  . metoprolol succinate (TOPROL-XL) 24 hr tablet 12.5 mg  12.5 mg Oral Daily Rice, Resa Miner, MD      . pantoprazole (PROTONIX) EC tablet 40 mg  40 mg Oral Q lunch Rice, Resa Miner, MD      . piperacillin-tazobactam (ZOSYN) IVPB 3.375 g  3.375 g Intravenous [address], [place]      . [START ON 01/20/2017] vancomycin  (VANCOCIN) IVPB 750 mg/150 ml premix  750 mg Intravenous Q M,W,F-HD Rebecka Apley, [facility]       Labs: Basic Metabolic Panel:  Recent Labs Lab 01/18/17 1249 01/19/17 0538  NA 137 136  K 3.5 4.1  CL 95* 93*  CO2 31 31  GLUCOSE 86 78  BUN 14 25*  CREATININE 3.10* 4.56*  CALCIUM 8.8* 9.0   Liver Function Tests:  Recent Labs Lab 01/18/17 1249  AST 31  ALT 27  ALKPHOS 97  BILITOT 1.0  PROT 7.3  ALBUMIN 3.7   CBC:  Recent Labs Lab 01/18/17 1249 01/19/17 0538  WBC 5.9 7.1  NEUTROABS 3.6  --   HGB 15.1 14.7  HCT 45.7 45.0  MCV 92.9 93.6  PLT 133* 152   CBG:  Recent Labs Lab 01/18/17 1600 01/18/17 2120 01/19/17 0232 01/19/17 0804  GLUCAP 143* 124* 99 176*   Studies/Results: Dg Chest 2 View  Result Date: 01/18/2017 CLINICAL DATA:  wound infection.  Cellulitis. EXAM: CHEST  2 VIEW COMPARISON:  12/15/2016 FINDINGS: Previous median sternotomy and CABG procedure. Mild cardiac enlargement. No pleural effusion or edema. No airspace opacities. Scar like opacity in the left lung base is unchanged from previous exam IMPRESSION: 1. No acute cardiopulmonary abnormalities. 2. Left base scar. Electronically Signed   By: Kerby Moors M.D.   On: 01/18/2017 16:35   Dg Foot Complete Right  Result Date: 01/18/2017 CLINICAL DATA:  Wound infection and cellulitis. EXAM: RIGHT FOOT COMPLETE - 3+ VIEW COMPARISON:  None. FINDINGS: There is no evidence of fracture or dislocation. There is no evidence of arthropathy or other focal bone abnormality. Soft tissues are unremarkable. IMPRESSION: Negative. Electronically Signed   By: Misty Stanley M.D.   On: 01/18/2017 16:33    Dialysis Orders: MWF at [facility] 4 hrs, BFR 376m DFR 800, EDW 60kg, 2K/2.5Ca bath, AVF - Heparin 4500 units q HD - No Mircera, VDRA, Sensipar, Venofer.  Assessment/Plan: 1.  R foot cellulitis: On Vanc/Zosyn. Per primary. Consider PAD eval while inpatient. 2.  ESRD: Continue MWF schedule, next  5/9. 3.  Hypertension/volume: BP borderline high, continue current meds/ EDW for now. 4.  Anemia: Hgb 14.7. No need for ESA at this time. 5.  Metabolic bone disease: Ca ok, Phos pending. Looks like not on binders/VDRA. Monitor. 6.  Nutrition:  Alb 3.7, adding Nepro supps. 7. Type 2  DM (with insulin pump): Per primary. 8. CAD (Hx CABG): No active complaints.On plavix. Per primary.  Veneta Penton, PA-C 01/19/2017, 9:19 AM  [facility] Pager: [phone]

## 2017-01-19 NOTE — Progress Notes (Addendum)
Pharmacy Antibiotic Note  Kenneth [name] is a 61 y.o. male admitted on 01/18/2017 with right foot cellulitis.  Pharmacy has been consulted for vancomycin and Zosyn dosing. Patient has history of ESRD on HD MWF. Last Zosyn dose was 5/7 15:38.   Plan: Zosyn 3.375 g IV q12h (infused over 4 hrs)  Monitor clinical progress and LOT Narrow abx as able    Temp (24hrs), Avg:98.1 F (36.7 C), Min:97.8 F (36.6 C), Max:98.4 F (36.9 C)   Recent Labs Lab 01/18/17 1249 01/19/17 0538  WBC 5.9 7.1  CREATININE 3.10* 4.56*    CrCl cannot be calculated (Unknown ideal weight.).    No Known Allergies  Antimicrobials this admission: Vanc 5/7 >>  Zosyn 5/7 >>   Dose adjustments this admission:   Microbiology results: 5/7 right foot wound: few Staph aureus 5/7 MRSA PCR: neg  Thank you for allowing pharmacy to be a part of this patient's care.  Renold Genta, PharmD, BCPS Clinical Pharmacist Phone for today - [place] - [phone] 01/19/2017 8:24 AM  Addendum JC received 1g of vancomycin in the ED yesterday. His estimated level is 17.5; which is the target level for possible potential osteomyelitis. Additionally, superficial wound culture is growing staph aureus (sensitivities pending). HD is planned for tomorrow, and the estimated level would be too low after HD. Will give a one time vancomycin dose now and continue with scheduled vancomycin with HD sessions.  Plan: Vancomycin 500mg  x1 now Vancomycin 750mg  qHD Monitor sensitivities, imaging, and ability to de-escalate

## 2017-01-19 NOTE — Progress Notes (Signed)
VASCULAR LAB PRELIMINARY  ARTERIAL ABI completed: Within normal limits at rest.    RIGHT    LEFT    PRESSURE WAVEFORM  PRESSURE WAVEFORM  BRACHIAL 142 Triphasic BRACHIAL AVF NA  DP 124 Biphasic DP 130 Triphasic  PT 136 Biphasic PT 141 Triphasic  GREAT TOE 0.62 NA GREAT TOE 0.76 NA    RIGHT LEFT  ABI 0.96 0.99     Kaydance Bowie D, RVT 01/19/2017, 2:56 PM

## 2017-01-19 NOTE — Progress Notes (Signed)
   Subjective:  Kenneth [name] was seen and evaluated today at bedside. Pain improved when he hangs his feet off the bed. Pain only somewhat improved. Denies any CP.  Objective: Vital signs in last 24 hours: Vitals:   01/18/17 1601 01/18/17 1703 01/18/17 2024 01/19/17 0517  BP: (!) 153/66 (!) 147/60 (!) 140/54 (!) 145/60  Pulse: 72 70 73 72  Resp: '14 18 18 18  '$ Temp: 98 F (36.7 C) 98 F (36.7 C) 97.8 F (36.6 C) 98.4 F (36.9 C)  TempSrc: Oral Oral Oral Oral  SpO2: 98% 98% 99% 95%   General: Pleasant and conversational. In no acute distress. Resting comfortably in bed.  HENT: No conjunctival injection, icterus or ptosis.  Cardiovascular: Regular rate and rhythm. No murmur or rub appreciated. Pulmonary: CTA BL. Unlabored breathing.  Abdomen: Soft, non-tender and non-distended.  Extremities: Absent right DP pulse. Skin of BL LE smooth and hairless. 3 ulcers on dorsal right foot with surrounding erythema which now extends beyond marker line. AVF left arm non-tender and with palpable thrill.  Psych: Mood normal and affect was mood congruent. Responds to questions appropriately.   Assessment/Plan:  Principal Problem:   Cellulitis in diabetic foot (Kenneth [name]) Active Problems:   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HCC)   Ischemic rest pain of lower extremity (HCC)   Pronation deformity of both feet   Shortness of breath   CHF (congestive heart failure), NYHA class I, chronic, systolic (HCC)   ESRD (end stage renal disease) (HCC)   CAD (coronary artery disease)   DM (diabetes mellitus), type 2 with renal complications (HCC)  Foot ulcerations with cellulitis in patient with PAD and Type 2 Diabetes: Vancomycin on board. Pain improved a little however erythema now extends beyond marker from yesterday. Afebrile, no leukocytosis. Ulcerations appear c/w arterial insufficiency ulcers, especially in this patient with hx of poor vascular supply and hx of PAD. ESR normal at 12. CRP elevated somewhat at 2.8 which  points away from osteo. -ABIs. If compromised flow confirmed, will consult VVS. Pt currently has pain at rest with diminished DP pulse on R however not cold limb and sensation intact. -Vancomycin in system, will continue with HD until sensitivities return -Culture so far with staph aureus, susceptibilities following  ESRD on HD MWF: Last HD session 5/7. Nephrology on board and will dialyze tomorrow. Appreciate nephrology's assistance in management of this patient  DM2: CBG well controlled with patients insulin pump. Continue per insulin pump protocol.   CAD s/p CABG x3: On ASA, plavix and statin presently. Denies CP or SOB.  Dispo: Anticipated discharge unclear, patient may benefit from inpatient revascularization.   Kenneth [name], Kenneth Starcher, DO 01/19/2017, 6:47 AM Pager: [phone]

## 2017-01-19 NOTE — Progress Notes (Signed)
Inpatient Diabetes Program Recommendations  AACE/ADA: New Consensus Statement on Inpatient Glycemic Control (2015)  Target Ranges:  Prepandial:   less than 140 mg/dL      Peak postprandial:   less than 180 mg/dL (1-2 hours)      Critically ill patients:  140 - 180 mg/dL   Review of Glycemic Control  Diabetes history: DM 2  Outpatient Diabetes medications: insulin pump (Novolog) Current orders for Inpatient glycemic control: Insulin pump order set  Inpatient Diabetes Program Recommendations:   Spoke with patient regarding diabetes and home regimen for diabetes management.  Patient uses a Medtronic insulin pump with Novolog insulin as an outpatient. Patient has insulin pump in the bed with him but it is not connected and is empty of insulin. Notified MD for Novolog vial for refill. Patient states that he does not have any extra insulin pump supplies with him here at the hospital and he will need to get his wife bring his insulin pump supplies here to the hospital so he can restart a new site within 2 days. Patient last changed his site yesterday and changes it every 3 days.   Current insulin pump settings are as follows:  Basal insulin   Total daily basal insulin: 21.6 units/24 hours  Carb Coverage 12 A-11A       1:12 1 unit for every 12 grams of carbohydrates 11A-12A        1:10       1 unit for every 10 grams of carbohydrates  Insulin Sensitivity 1:41 1 unit drops blood glucose 41 mg/dl  Target Glucose Goals 100-120 mg/dl   In talking with the patient he states that his blood glucose normally runs high often and occassionally will have a low blood sugar around 47-67 about once a week. Patient and wife both agree that they would like to see an Endocrinologist either in [place] or [place] for glucose control especially concerning his foot infection. Patient reports bolusing large doses of insulin at home. Patients last A1C was 7.7% on 10/31/16.  Patient reports his PCP now  prescribes his pump supplies. Patient verbalized understanding of information discussed and states that he does not have any further questions related to diabetes at this time.  NURSING: RN will need to complete the Nursing Insulin Pump Flowsheet at least once a shift. Patient will need to keep extra insulin pump supplies at the bedside at all times. Vial of Novolog to be ordered for patient to refill insulin pump.   Thanks,  Tama Headings RN, MSN, [facility] Inpatient Diabetes Coordinator Team Pager [phone] (8a-5p)

## 2017-01-20 DIAGNOSIS — L089 Local infection of the skin and subcutaneous tissue, unspecified: Secondary | ICD-10-CM

## 2017-01-20 DIAGNOSIS — T148XXA Other injury of unspecified body region, initial encounter: Secondary | ICD-10-CM

## 2017-01-20 LAB — CBC
HCT: 43.5 % (ref 39.0–52.0)
Hemoglobin: 14.4 g/dL (ref 13.0–17.0)
MCH: 30.6 pg (ref 26.0–34.0)
MCHC: 33.1 g/dL (ref 30.0–36.0)
MCV: 92.6 fL (ref 78.0–100.0)
Platelets: 183 10*3/uL (ref 150–400)
RBC: 4.7 MIL/uL (ref 4.22–5.81)
RDW: 14 % (ref 11.5–15.5)
WBC: 6.8 10*3/uL (ref 4.0–10.5)

## 2017-01-20 LAB — RENAL FUNCTION PANEL
Albumin: 3.1 g/dL — ABNORMAL LOW (ref 3.5–5.0)
Anion gap: 14 (ref 5–15)
BUN: 34 mg/dL — ABNORMAL HIGH (ref 6–20)
CO2: 25 mmol/L (ref 22–32)
Calcium: 8.5 mg/dL — ABNORMAL LOW (ref 8.9–10.3)
Chloride: 94 mmol/L — ABNORMAL LOW (ref 101–111)
Creatinine, Ser: 5.51 mg/dL — ABNORMAL HIGH (ref 0.61–1.24)
GFR calc Af Amer: 12 mL/min — ABNORMAL LOW
GFR calc non Af Amer: 10 mL/min — ABNORMAL LOW
Glucose, Bld: 266 mg/dL — ABNORMAL HIGH (ref 65–99)
Phosphorus: 5.1 mg/dL — ABNORMAL HIGH (ref 2.5–4.6)
Potassium: 5 mmol/L (ref 3.5–5.1)
Sodium: 133 mmol/L — ABNORMAL LOW (ref 135–145)

## 2017-01-20 LAB — GLUCOSE, CAPILLARY
GLUCOSE-CAPILLARY: 391 mg/dL — AB (ref 65–99)
GLUCOSE-CAPILLARY: 94 mg/dL (ref 65–99)
Glucose-Capillary: 72 mg/dL (ref 65–99)
Glucose-Capillary: 268 mg/dL — ABNORMAL HIGH (ref 65–99)
Glucose-Capillary: 69 mg/dL (ref 65–99)
Glucose-Capillary: 131 mg/dL — ABNORMAL HIGH (ref 65–99)
Glucose-Capillary: 352 mg/dL — ABNORMAL HIGH (ref 65–99)

## 2017-01-20 MED ORDER — VANCOMYCIN HCL IN DEXTROSE 750-5 MG/150ML-% IV SOLN
INTRAVENOUS | Status: AC
  Filled 2017-01-20: qty 150

## 2017-01-20 MED ORDER — HEPARIN SODIUM (PORCINE) 1000 UNIT/ML DIALYSIS: 1000.0000 [IU] | INTRAMUSCULAR | Status: DC | PRN

## 2017-01-20 MED ORDER — HEPARIN SODIUM (PORCINE) 1000 UNIT/ML DIALYSIS: 20.0000 [IU]/kg | INTRAMUSCULAR | Status: DC | PRN

## 2017-01-20 MED ORDER — PENTAFLUOROPROP-TETRAFLUOROETH EX AERO: 1.0000 "application " | INHALATION_SPRAY | CUTANEOUS | Status: DC | PRN

## 2017-01-20 MED ORDER — LIDOCAINE-PRILOCAINE 2.5-2.5 % EX CREA: 1.0000 "application " | TOPICAL_CREAM | CUTANEOUS | Status: DC | PRN

## 2017-01-20 MED ORDER — SODIUM CHLORIDE 0.9 % IV SOLN: 100.0000 mL | INTRAVENOUS | Status: DC | PRN

## 2017-01-20 MED ORDER — COLLAGENASE 250 UNIT/GM EX OINT
TOPICAL_OINTMENT | Freq: Every day | CUTANEOUS | Status: DC
  Administered 2017-01-20 – 2017-01-21 (×2): via TOPICAL
  Filled 2017-01-20 (×2): qty 30

## 2017-01-20 MED ORDER — LIDOCAINE HCL (PF) 1 % IJ SOLN: 5.0000 mL | INTRAMUSCULAR | Status: DC | PRN

## 2017-01-20 NOTE — Consult Note (Signed)
[facility] NOTE   MRN : [id number]  Reason for Consult: right foot cellulitis and toe ulcers Referring Physician: Dr. Danford Bad  History of Present Illness: 61 y/o male with 1 week history of right 1-3 dorsum of toe wound and cellulitis of the foot.  He states he is not having much pain and can walk around a store without calf pain.  Past medical history:  He is a diabetic and has an insulin pump.  He has known CAD s/p CABG x 3, ESRD on HD for the last year, CHF, and HTN managed with Metoprolol and amlodipine.  He takes a daily statin and Plavix.       Current Facility-Administered Medications  Medication Dose Route Frequency Provider Last Rate Last Dose  . acetaminophen (TYLENOL) tablet 1,000 mg  1,000 mg Oral Q6H PRN Collier Salina, MD   1,000 mg at 01/20/17 0249  . amLODipine (NORVASC) tablet 10 mg  10 mg Oral QHS Collier Salina, MD   10 mg at 01/19/17 2058  . aspirin chewable tablet 81 mg  81 mg Oral Daily Collier Salina, MD   81 mg at 01/20/17 1314  . atorvastatin (LIPITOR) tablet 80 mg  80 mg Oral QHS Collier Salina, MD   80 mg at 01/19/17 2058  . clopidogrel (PLAVIX) tablet 75 mg  75 mg Oral Daily Collier Salina, MD   75 mg at 01/20/17 1315  . collagenase (SANTYL) ointment   Topical Daily Axel Filler, MD      . ezetimibe (ZETIA) tablet 10 mg  10 mg Oral Q lunch Collier Salina, MD   10 mg at 01/20/17 1313  . heparin injection 5,000 Units  5,000 Units Subcutaneous Q8H Rice, Resa Miner, MD   5,000 Units at 01/20/17 1314  . hydrALAZINE (APRESOLINE) tablet 50 mg  50 mg Oral BID Collier Salina, MD   50 mg at 01/20/17 1315  . insulin pump   Subcutaneous TID AC, HS, 0200 Axel Filler, MD      . metoprolol succinate (TOPROL-XL) 24 hr tablet 12.5 mg  12.5 mg Oral Daily Collier Salina, MD   12.5 mg at 01/20/17 1315  . pantoprazole (PROTONIX) EC tablet 40 mg  40 mg Oral Q lunch Collier Salina,  MD   40 mg at 01/20/17 1314  . piperacillin-tazobactam (ZOSYN) IVPB 3.375 g  3.375 g Intravenous Q12H Alvira Philips, RPH 12.5 mL/hr at 01/20/17 1313 3.375 g at 01/20/17 1313  . vancomycin (VANCOCIN) IVPB 750 mg/150 ml premix  750 mg Intravenous Q M,W,F-HD Rebecka Apley, RPH 150 mL/hr at 01/20/17 1124 750 mg at 01/20/17 1124    Pt meds include: Statin :Yes Betablocker: Yes ASA: Yes Other anticoagulants/antiplatelets: Plavix  Past Medical History:  Diagnosis Date  . Acute pulmonary edema ([place]) 05/31/2016  . Acute respiratory failure with hypoxemia ([place]) 05/31/2016  . CHF (congestive heart failure) ([place])   . 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 ESRD (end stage renal disease) on dialysis [facility])    "MWF; [facility]" (01/18/2017)  . HCAP (healthcare-associated pneumonia)   . Ischemic rest pain of lower extremity ([place]) 02/06/2015  . 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 NSTEMI (non-ST elevated myocardial infarction) ([place])   . Pain in lower limb 02/06/2015  . Pain in the chest   . Pleural effusion   . 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 Shortness of breath  Past Surgical History:  Procedure Laterality Date  . AV FISTULA PLACEMENT Left 06/22/2016   Procedure: ARTERIOVENOUS (AV) FISTULA CREATION LEFT UPPER ARM;  Surgeon: Rosetta Posner, MD;  Location: [place];  Service: Vascular;  Laterality: Left;  . CARDIAC CATHETERIZATION N/A 05/31/2016   Procedure: Left Heart Cath and Coronary Angiography;  Surgeon: Jettie Booze, MD;  Location: [place] CV LAB;  Service: Cardiovascular;  Laterality: N/A;  . CARDIAC CATHETERIZATION N/A 05/31/2016   Procedure: Right Heart Cath;  Surgeon: Jettie Booze, MD;  Location: [place] CV LAB;  Service: Cardiovascular;  Laterality: N/A;  . CARDIAC CATHETERIZATION N/A 05/31/2016   Procedure: IABP Insertion;  Surgeon: Jettie Booze, MD;  Location: [place] CV LAB;  Service: Cardiovascular;  Laterality: N/A;   . CORONARY ARTERY BYPASS GRAFT N/A 06/05/2016   Procedure: CORONARY ARTERY BYPASS GRAFTING (CABG) x3 LIMA to LAD -SVG to OM -SVG to RCA;  Surgeon: Ivin Poot, MD;  Location: [place];  Service: Open Heart Surgery;  Laterality: N/A;  . INSERTION OF DIALYSIS CATHETER N/A 06/05/2016   Procedure: INSERTION OF DIALYSIS/trialysis CATHETER;  Surgeon: Ivin Poot, MD;  Location: [place];  Service: Vascular;  Laterality: N/A;  . INSERTION OF DIALYSIS CATHETER Right 06/13/2016   Procedure: INSERTION OF DIALYSIS CATHETER RIGHT INTERNAL JUGULAR;  Surgeon: Conrad Arnold Line, MD;  Location: [place];  Service: Vascular;  Laterality: Right;  . INTRAOPERATIVE TRANSESOPHAGEAL ECHOCARDIOGRAM N/A 06/05/2016   Procedure: INTRAOPERATIVE TRANSESOPHAGEAL ECHOCARDIOGRAM;  Surgeon: Ivin Poot, MD;  Location: [place];  Service: Open Heart Surgery;  Laterality: N/A;  . LEFT HEART CATH AND CORS/GRAFTS ANGIOGRAPHY N/A 11/03/2016   Procedure: Left Heart Cath and Cors/Grafts Angiography;  Surgeon: Troy Sine, MD;  Location: [place] [facility];  Service: Cardiovascular;  Laterality: N/A;    Social History Social History  Substance Use Topics  . Smoking status: Former Research scientist (life sciences)  . Smokeless tobacco: Never Used  . Alcohol use No    Family History Family History  Problem Relation Age of Onset  . CAD Father   . Colon cancer Father   . Diabetes Brother     No Known Allergies   REVIEW OF SYSTEMS  General: [ ]  Weight loss, [ ]  Fever, [ ]  chills Neurologic: [ ]  Dizziness, [ ]  Blackouts, [ ]  Seizure [ ]  Stroke, [ ]  "Mini stroke", [ ]  Slurred speech, [ ]  Temporary blindness; [ ]  weakness in arms or legs, [ ]  Hoarseness [ ]  Dysphagia Cardiac: [ ]  Chest pain/pressure, [ ]  Shortness of breath at rest [x ] Shortness of breath with exertion, [ ]  Atrial fibrillation or irregular heartbeat  Vascular: [ ]  Pain in legs with walking, [ ]  Pain in legs at rest, [ ]  Pain in legs at night,  [x ] Non-healing ulcer, [ ]  Blood clot in  vein/DVT,   Pulmonary: [ ]  Home oxygen, [ ]  Productive cough, [ ]  Coughing up blood, [ ]  Asthma,  [ ]  Wheezing [ ]  COPD Musculoskeletal:  [ ]  Arthritis, [ ]  Low back pain, [ ]  Joint pain Hematologic: [ ]  Easy Bruising, [ ]  Anemia; [ ]  Hepatitis Gastrointestinal: [ ]  Blood in stool, [ ]  Gastroesophageal Reflux/heartburn, Urinary: [x ] chronic Kidney disease, [x ] on HD - [ x] MWF or [ ]  TTHS, [ ]  Burning with urination, [ ]  Difficulty urinating Skin: [ ]  Rashes, [x ] Wounds Psychological: [ ]  Anxiety, [ ]  Depression  Physical Examination Vitals:   01/20/17 1100 01/20/17  1130 01/20/17 1156 01/20/17 1231  BP: (!) 106/30 131/62 137/61 136/64  Pulse: 63 61 60 62  Resp: 16 18 18    Temp:   97.5 F (36.4 C)   TempSrc:   Oral   SpO2:   95% 96%  Weight:   137 lb 9.1 oz (62.4 kg)   Height:       Body mass index is 22.2 kg/m.  General:  WDWN in NAD HENT: WNL Eyes: Pupils equal Pulmonary: normal non-labored breathing , without Rales, rhonchi,  wheezing Cardiac: RRR, without  Murmurs, rubs or gallops; No carotid bruits Abdomen: soft, NT, no masses Skin: no rashes, ulcers noted;  no Gangrene , positive right forefoot, dorsum cellulitis; 3 mm dorsum great toe, 2 cm dorsum second toe, and 1.5 cm dorsum third toe ulcers.    Vascular Exam/Pulses:Left brachiocephalic thrill, palpable radial pulses B, femoral L > R, popliteal L>R, DP palpable on the right, DP/PT palpable on the left   Musculoskeletal: no muscle wasting or atrophy; no edema  Neurologic: A&O X 3; Appropriate Affect ;  SENSATION: normal; MOTOR FUNCTION: 5/5 Symmetric Speech is fluent/normal   Significant Diagnostic Studies: CBC Lab Results  Component Value Date   WBC 6.8 01/20/2017   HGB 14.4 01/20/2017   HCT 43.5 01/20/2017   MCV 92.6 01/20/2017   PLT 183 01/20/2017    BMET    Component Value Date/Time   NA 133 (L) 01/20/2017 0540   K 5.0 01/20/2017 0540   CL 94 (L) 01/20/2017 0540   CO2 25 01/20/2017 0540    GLUCOSE 266 (H) 01/20/2017 0540   BUN 34 (H) 01/20/2017 0540   CREATININE 5.51 (H) 01/20/2017 0540   CALCIUM 8.5 (L) 01/20/2017 0540   GFRNONAA 10 (L) 01/20/2017 0540   GFRAA 12 (L) 01/20/2017 0540   Estimated Creatinine Clearance: 12.7 mL/min (A) (by C-G formula based on SCr of 5.51 mg/dL (H)).  COAG Lab Results  Component Value Date   INR 1.24 11/03/2016   INR 1.10 06/12/2016   INR 1.47 06/06/2016     Non-Invasive Vascular Imaging:   +--------+-----+---+ !        !Right!Max! +--------+-----+---+ !Systolic!142  !142! +--------+-----+---+  Arterial pressure indices:  +-----------------+---------+--------------+---------+ !Location         !Pressure !Brachial index!Waveform ! +-----------------+---------+--------------+---------+ !Right ant tibial !124 mm Hg!0.87          !Biphasic ! +-----------------+---------+--------------+---------+ !Right post tibial!136 mm Hg!0.96          !Biphasic ! +-----------------+---------+--------------+---------+ !Right 1st toe    !88 mm Hg !0.62          !---------! +-----------------+---------+--------------+---------+ !Left ant tibial  !130 mm Hg!0.92          !Triphasic! +-----------------+---------+--------------+---------+ !Left post tibial !141 mm Hg!0.99          !Triphasic! +-----------------+---------+--------------+---------+ !Left 1st toe     !108 mm Hg!0.76          !---------! +-----------------+---------+--------------+---------+  ------------------------------------------------------------------- Summary:  1. Bilateral ankle brachial indices are within normal limits at    rest. There is a mild decrease in the right sided ABI value when    compared to prior study.   ASSESSMENT/PLAN:  Right foot cellulitis with 1-3 toe ulcers He has normal ABI's B and a palpable right DP pulse.   He is currently being managed on IV antibiotics.    Laurence Slate [facility] 01/20/2017 1:33 PM   History and exam details as  above. Pt has 2+ femoral pulses and popliteal  pulses bilaterallly 2+ right DP, 2+ left PT Abrasions poorly healing dorsum of toes 2 and 3 with cellulitis and edema to mid forefoot. Most likely this represents small vessel disease from his diabetes. He has essentially normal ABIs and palpable pulse in right foot so no indication for arteriogram or revascularization Would obtain MRI foot to make sure he does not have underlying abscess or necrosis.  May need foot debridement by orthopedics if deep infection otherwise would continue local wound care and antibiotics  [name], MD [facility] [place] Office: [phone] Pager: [phone]

## 2017-01-20 NOTE — Consult Note (Addendum)
[place] Nurse wound consult note Reason for Consult: Consult requested for right foot.  Pt was noted to have previous cellulitis and edema which is receeding, according to the line which was drawn on his foot. In the EMR, he noted he had some blisters last week which have evolved into full thickness wounds. His ABI was adequate at .96 Wound type: 2 areas of full thickness wounds to the anterior toes, each with dry eschar tightly adhered: Right second toe 2.5X1.5cm Right 3rd toe 3X1.5cm Drainage (amount, consistency, odor) No odor or drainage Dressing procedure/placement/frequency: Santyl ointment to provide enzymatic debridement of nonviable tissue. Pt can resume follow up with his podiatrist after discharge.  Discussed plan of care with patient and he verbalized understanding. Please re-consult if further assistance is needed.  Thank-you,  Julien Girt MSN, [place], [place], [place], [place]

## 2017-01-20 NOTE — Progress Notes (Signed)
MD made aware of CBG of 391. Verified patients insulin pump has insulin

## 2017-01-20 NOTE — Procedures (Signed)
Patient seen and examined on Hemodialysis. QB 400, UF goal 2.5 but gain 4.4L Will need to be aggressive with UF if possible (pt can't tolerate too much off) Treatment adjusted as needed.  Madelon Lips MD [facility] 10:47 AM

## 2017-01-20 NOTE — Progress Notes (Signed)
   Subjective: Kenneth [name] was seen and evaluated today during HD. Pain improved however erythema without significant improvement. Pain improved generally when he hangs foot off bed.   Objective: Vital signs in last 24 hours: Vitals:   01/20/17 1100 01/20/17 1130 01/20/17 1156 01/20/17 1231  BP: (!) 106/30 131/62 137/61 136/64  Pulse: 63 61 60 62  Resp: _0 Temp:   97.5 F (36.4 C)   TempSrc:   Oral   SpO2:   95% 96%  Weight:   137 lb 9.1 oz (62.4 kg)   Height:       General: Pleasant and conversational. In no acute distress. Resting comfortably during HD. HENT: No conjunctival injection, icterus or ptosis.  Cardiovascular: Regular rate and rhythm. No murmur or rub appreciated. Well-healed midline scar from CABG Pulmonary: CTA BL anterior chest. Unlabored breathing.  Extremities: 3 ulcers on dorsal right foot. Surrounding erythema continues to extend beyond marker. Absent right DP pulse. Skin of BL LE smooth and hairless.  Psych: Mood normal and affect was mood congruent. Responds to questions appropriately.   Assessment/Plan:  Principal Problem:   Cellulitis in diabetic foot ([place]) Active Problems:   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HCC)   Ischemic rest pain of lower extremity (HCC)   Pronation deformity of both feet   Shortness of breath   CHF (congestive heart failure), NYHA class I, chronic, systolic (HCC)   ESRD (end stage renal disease) (HCC)   CAD (coronary artery disease)   DM (diabetes mellitus), type 2 with renal complications (HCC)  Foot ulcerations with cellulitis in patient with hx of PAD and Type 2 Diabetes: Day 3 of Vancomycin and Zosyn and patient is without improvement of his erythema. Pain improved but notices much improvement when he hangs foot off edge of bed. He remains afebrile and without leukocytosis. Plain film without osteo and ESR normal. ABI's wnl however given patients high pretest probability for ischemia, will consult VVS for their advice in  management. We will also consult wound care for their advice in management as well.  -VVS and wound care consults. Wound care recs enzymatic debridement and follow up with podiatry -Vancomycin + Zosyn continued -Culture with S. Aureus however sensitivities not returned.   ESRD on HD MWF: Seen during HD today and was tolerating well. Appreciate nephrology's assistance in management of this patient  DM2: CBG well controlled with patients insulin pump. Continue per pump protocol.   CAD s/p CABG x3, Hx of PAD?: On ASA, plavix and statin presently. Denies CP or SOB.  Dispo: Anticipated discharge unclear, need better clinical response and opinion from VSS prior to comfortable discharge.   Hiroko Tregre, DO 01/20/2017, 1:03 PM Pager: [phone]

## 2017-01-20 NOTE — Progress Notes (Signed)
Subjective:   On Hd ,Foot discomfort better  Objective Vital signs in last 24 hours: Vitals:   01/20/17 0456 01/20/17 0824 01/20/17 0829 01/20/17 0900  BP: (!) 130/54 (!) 158/80 (!) 146/69 138/67  Pulse: 69 63 61 61  Resp: 18 18 16 16   Temp: 98.7 F (37.1 C)     TempSrc: Oral     SpO2: 96%     Weight:      Height:       Weight change:   Physical Exam: General: ON HD , NAD   Lungs: CTA  nonlabored breathing  Heart: RRR; 2/6 systolic murmur.no rub  Abdomen: Soft, non-tender, non-distended NL  bowel sounds.  Lower extremities: RLE with trace edema present around cellulitis dorsal foot and dried wound overlying 2nd/3rd toe bases. LLE without edema. Dialysis Access: LUE AVF patent on HD    OP Dialysis Orders: MWF at [place] ([facility]) [place] 4 hrs, BFR 335m DFR 800, EDW 60kg, 2K/2.5Ca bath, AVF - Heparin 4500 units q HD - No Mircera, VDRA, Sensipar, Venofer.  Problem/Plan: 1.  R foot cellulitis: On Vanc/Zosyn. Per primary. /  PAD eval yesterday ABIs= R= 0.96/ L = .99 2.  ESRD: Continue MWF schedule, HD  Today  k=5.0  3.  Hypertension/volume: BP borderline high,  Improving continue current meds/  Today ? 4.4 kg >edw standing wt  bp  Coming down on hd  . Fu wts and bp  4.  Anemia: Hgb 14.7.>14.4 No ESA . 5.  Metabolic bone disease: Ca ok, Phos 5.1. As op not on binders/VDRA. Monitor phos  6.  Nutrition:  Alb 3.7>3.1, adding Nepro supps. 7. Type 2 DM (with insulin pump):  RX per admit /BS  up this am = "ate some rice last night  At 3 am got hungry"  8. CAD (Hx CABG): No active complaints.On plavix. Per primary.   Ernest Haber, PA-C [facility] [place] [phone] 01/20/2017,9:11 AM  LOS: 2 days   Labs:4  Basic Metabolic Panel:  Recent Labs Lab 01/18/17 1249 01/19/17 0538 01/20/17 0540  NA 137 136 133*  K 3.5 4.1 5.0  CL 95* 93* 94*  CO2 31 31 25   GLUCOSE 86 78 266*  BUN 14 25* 34*  CREATININE 3.10* 4.56* 5.51*  CALCIUM 8.8* 9.0 8.5*  PHOS  --   --   5.1*   Liver Function Tests:  Recent Labs Lab 01/18/17 1249 01/20/17 0540  AST 31  --   ALT 27  --   ALKPHOS 97  --   BILITOT 1.0  --   PROT 7.3  --   ALBUMIN 3.7 3.1*   CBC:  Recent Labs Lab 01/18/17 1249 01/19/17 0538 01/20/17 0540  WBC 5.9 7.1 6.8  NEUTROABS 3.6  --   --   HGB 15.1 14.7 14.4  HCT 45.7 45.0 43.5  MCV 92.9 93.6 92.6  PLT 133* 152 183   Cardiac Enzymes: No results for input(s): CKTOTAL, CKMB, CKMBINDEX, TROPONINI in the last 168 hours. CBG:  Recent Labs Lab 01/19/17 1223 01/19/17 1610 01/19/17 2008 01/20/17 0305 01/20/17 0748  GLUCAP 239* 153* 120* 72 268*     Medications: . piperacillin-tazobactam (ZOSYN)  IV 3.375 g (01/19/17 2024)  . vancomycin     . amLODipine  10 mg Oral QHS  . aspirin  81 mg Oral Daily  . atorvastatin  80 mg Oral QHS  . clopidogrel  75 mg Oral Daily  . ezetimibe  10 mg Oral Q lunch  .  heparin  5,000 Units Subcutaneous Q8H  . hydrALAZINE  50 mg Oral BID  . insulin pump   Subcutaneous TID AC, HS, 0200  . metoprolol succinate  12.5 mg Oral Daily  . pantoprazole  40 mg Oral Q lunch

## 2017-01-20 NOTE — Progress Notes (Signed)
Internal Medicine Attending:   I saw and examined the patient. I reviewed the resident's note and I agree with the resident's findings and plan as documented in the resident's note.  61 year old man who is hospital day #3 with right foot ulcerations and associated cellulitis. Clinically the ulcerations appear to be ischemic in origin, I was surprised to see the ABI returned essentially normal with only reduced right toe pressure at 0.62. Patient still has very high risk for ischemic disease given dialysis, diabetes, history of CABG, so I'm wondering if this ABI might be a false negative. We will consult vascular surgery to evaluate, in this setting does this ABI fully rule out limb ischemia? The area of redness to me looks unchanged from admission, he says his pain is a little better, so cellulitis is not responding very well to broad spectrum antibiotics so far. Continue with aspirin, plavix, and statin.

## 2017-01-21 ENCOUNTER — Inpatient Hospital Stay (HOSPITAL_COMMUNITY): Payer: Medicare Other

## 2017-01-21 ENCOUNTER — Encounter (HOSPITAL_COMMUNITY): Payer: Self-pay | Admitting: *Deleted

## 2017-01-21 DIAGNOSIS — M216X1 Other acquired deformities of right foot: Secondary | ICD-10-CM

## 2017-01-21 DIAGNOSIS — M216X2 Other acquired deformities of left foot: Secondary | ICD-10-CM

## 2017-01-21 DIAGNOSIS — I5022 Chronic systolic (congestive) heart failure: Secondary | ICD-10-CM

## 2017-01-21 LAB — AEROBIC CULTURE W GRAM STAIN (SUPERFICIAL SPECIMEN)

## 2017-01-21 LAB — BASIC METABOLIC PANEL
Anion gap: 11 (ref 5–15)
BUN: 20 mg/dL (ref 6–20)
CALCIUM: 8.6 mg/dL — AB (ref 8.9–10.3)
CO2: 26 mmol/L (ref 22–32)
Chloride: 95 mmol/L — ABNORMAL LOW (ref 101–111)
Creatinine, Ser: 4.9 mg/dL — ABNORMAL HIGH (ref 0.61–1.24)
GFR, EST AFRICAN AMERICAN: 14 mL/min — AB (ref >60)
GFR, EST NON AFRICAN AMERICAN: 12 mL/min — AB (ref >60)
Glucose, Bld: 159 mg/dL — ABNORMAL HIGH (ref 65–99)
Potassium: 4.9 mmol/L (ref 3.5–5.1)
Sodium: 132 mmol/L — ABNORMAL LOW (ref 135–145)

## 2017-01-21 LAB — CBC
HEMATOCRIT: 41.9 % (ref 39.0–52.0)
Hemoglobin: 13.8 g/dL (ref 13.0–17.0)
MCH: 30.4 pg (ref 26.0–34.0)
MCHC: 32.9 g/dL (ref 30.0–36.0)
MCV: 92.3 fL (ref 78.0–100.0)
Platelets: 159 10*3/uL (ref 150–400)
RBC: 4.54 MIL/uL (ref 4.22–5.81)
RDW: 13.9 % (ref 11.5–15.5)
WBC: 7.7 10*3/uL (ref 4.0–10.5)

## 2017-01-21 LAB — GLUCOSE, CAPILLARY
GLUCOSE-CAPILLARY: 104 mg/dL — AB (ref 65–99)
GLUCOSE-CAPILLARY: 286 mg/dL — AB (ref 65–99)
Glucose-Capillary: 192 mg/dL — ABNORMAL HIGH (ref 65–99)

## 2017-01-21 MED ORDER — NEPRO/CARBSTEADY PO LIQD
237.0000 mL | Freq: Two times a day (BID) | ORAL | Status: DC
  Administered 2017-01-21: 237 mL via ORAL

## 2017-01-21 MED ORDER — COLLAGENASE 250 UNIT/GM EX OINT: TOPICAL_OINTMENT | Freq: Every day | CUTANEOUS | 0 refills | Status: DC

## 2017-01-21 MED ORDER — AMOXICILLIN-POT CLAVULANATE 500-125 MG PO TABS: 1.0000 | ORAL_TABLET | ORAL | 0 refills | Status: AC

## 2017-01-21 MED ORDER — AMOXICILLIN-POT CLAVULANATE 500-125 MG PO TABS: 1.0000 | ORAL_TABLET | ORAL | Status: DC

## 2017-01-21 NOTE — Progress Notes (Signed)
Transitions of Care Pharmacy Note  Plan:  Educated on Augmentin Outpatient follow-up with PCP as directed by primary care team --------------------------------------------- Kenneth [name] is an 61 y.o. male who presents with a chief complaint cellulitis. In anticipation of discharge, pharmacy has reviewed this patient's prior to admission medication history, as well as current inpatient medications listed per the [facility].  Current medication indications, dosing, frequency, and notable side effects reviewed with patient. patient verbalized understanding of current inpatient medication regimen and is aware that the After Visit Summary when presented, will represent the most accurate medication list at discharge.   Kenneth [name] expressed no concerns during counselingl.   Assessment: Understanding of regimen: good Understanding of indications: good Potential of compliance: good Barriers to Obtaining Medications: No  Patient instructed to contact inpatient pharmacy team with further questions or concerns if needed.    Time spent preparing for discharge counseling: 10 minutes Time spent counseling patient: 5 minutes   Thank you for allowing pharmacy to be a part of this patient's care.  [employer]

## 2017-01-21 NOTE — Progress Notes (Signed)
Patient was discharged to home. AVS was reviewed with patient and wife. IV was discontinued. Patient education including wound care complete. Patient left floor via wheelchair with volunteers.

## 2017-01-21 NOTE — Progress Notes (Signed)
   Subjective: Kenneth [name] was seen and evaluated today at bedside. Pain significantly improved although has not tried to ambulate per wifes suggestion. Was encouraged to ambulate. Reports feeling as if his toes get cold at night.   Objective: Vital signs in last 24 hours: Vitals:   01/20/17 2115 01/20/17 2159 01/21/17 0540 01/21/17 0953  BP: (!) 147/80  (!) 146/59 (!) 141/61  Pulse: 92  69 74  Resp: 18  18 17   Temp: 97.7 F (36.5 C)  98.6 F (37 C) 98.5 F (36.9 C)  TempSrc: Oral  Oral Oral  SpO2: 98%  97% 97%  Weight:  138 lb 4.8 oz (62.7 kg)    Height:       General: Alert, pleasant and conversational. Resting comfortably in bed without distress. Wife present at bedside. HENT: No conjunctival injection, icterus or ptosis.  Cardiovascular: Regular rate and rhythm. Did not appreciate murmur. Well-healed midline scar from CABG Pulmonary: CTA BL. Unlabored breathing.  Extremities: R LE wounds covered in clean dry dressings. Extremities and phalanges warm and perfused. Erythema improved. Minimally painful to touch. Psych: Mood normal and affect was mood congruent. Responds to questions appropriately.   Assessment/Plan:  Principal Problem:   Cellulitis in diabetic foot (Kenneth [name]) Active Problems:   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HCC)   Ischemic rest pain of lower extremity (HCC)   Pronation deformity of both feet   Shortness of breath   CHF (congestive heart failure), NYHA class I, chronic, systolic (HCC)   ESRD (end stage renal disease) (HCC)   CAD (coronary artery disease)   DM (diabetes mellitus), type 2 with renal complications (HCC)  Foot ulcerations with cellulitis in patient with hx of PAD and Type 2 Diabetes: Day 4 of broad spectrum antibiotics. Pain much improved today and erythema appears to be receeding. He remains afebrile and feels well. VVS consulted yesterday who felt ulcers are secondary to small vessel disease. Recommended MRI to r/o abscess or osteo however did not rec  vascular studies. MRI without abscess or osteo.  -Narrow antibiotics to Augmentin to complete a 7 day course -Encouraged ambulation and will likely dc today if able. -Patient to follow-up with podiatry for continued follow-up and debridement if needed. Already has appointment scheduled 1 week from today.   ESRD on HD MWF: uncomplicated HD session yesterday. Not volume-up. Appreciate nephrology's assistance in management of this patient  DM2: On insulin pump with controlled CBG except for several values 250's-350. Pump was evaluated and found to be working and CBG have since improved. Continue per pump protocol.   CAD s/p CABG x3, Hx of PAD?: ABI wnl. Continue ASA, plavix and statin.   Dispo: Anticipated discharge today pending successful ambulation. Close follow-up already scheduled with podiatry next week.    Kenneth Yielding, DO 01/21/2017, 10:05 AM Pager: [phone]

## 2017-01-21 NOTE — Discharge Instructions (Signed)
Kenneth [name], you were admitted due to an infection of your foot. You were treated with several days of IV antibiotics and are being discharged home with oral antibiotics to complete your course. Please take AUGMENTIN 1 PILL EVERY NIGHT starting today (5/10) with your last dose Sunday (5/14).  You will need to follow-up with your podiatrist at your regularly scheduled appointment for follow-up of your wound.  Please keep your wounds clean and dry.   PLEASE FOLLOW-UP WITH ONE OF THE ENDOCRINOLOGISTS LISTED IN YOUR DISCHARGE PAPERWORK. WE BELIEVE YOU MAY NEED ADDITIONAL EDUCATION AND ASSISTANCE WITH YOUR INSULIN PUMP.     Cellulitis, Adult Cellulitis is a skin infection. The infected area is usually red and sore. This condition occurs most often in the arms and lower legs. It is very important to get treated for this condition. Follow these instructions at home:  Take over-the-counter and prescription medicines only as told by your doctor.  If you were prescribed an antibiotic medicine, take it as told by your doctor. Do not stop taking the antibiotic even if you start to feel better.  Drink enough fluid to keep your pee (urine) clear or pale yellow.  Do not touch or rub the infected area.  Raise (elevate) the infected area above the level of your heart while you are sitting or lying down.  Place warm or cold wet cloths (warm or cold compresses) on the infected area. Do this as told by your doctor.  Keep all follow-up visits as told by your doctor. This is important. These visits let your doctor make sure your infection is not getting worse. Contact a doctor if:  You have a fever.  Your symptoms do not get better after 1-2 days of treatment.  Your bone or joint under the infected area starts to hurt after the skin has healed.  Your infection comes back. This can happen in the same area or another area.  You have a swollen bump in the infected area.  You have new symptoms.  You feel  ill and also have muscle aches and pains. Get help right away if:  Your symptoms get worse.  You feel very sleepy.  You throw up (vomit) or have watery poop (diarrhea) for a long time.  There are red streaks coming from the infected area.  Your red area gets larger.  Your red area turns darker. This information is not intended to replace advice given to you by your health care provider. Make sure you discuss any questions you have with your health care provider. Document Released: 02/17/2008 Document Revised: 02/06/2016 Document Reviewed: 07/10/2015 Elsevier Interactive Patient Education  2017 [employer].

## 2017-01-21 NOTE — Progress Notes (Signed)
Name: Kenneth [name] MRN: [id number] DOB: 01/14/1956 61 y.o. PCP: [name], MD  Date of Admission: 01/18/2017  2:57 PM Date of Discharge: 01/21/2017 Attending Physician: Axel Filler, *  Discharge Diagnosis: 1. Right foot ulcerations with surrounding cellulitis 2. Type 2 diabetes 3. ESRD on HD  Principal Problem:   Cellulitis in diabetic foot ([place]) Active Problems:   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[place])   Ischemic rest pain of lower extremity (HCC)   Pronation deformity of both feet   Shortness of breath   CHF (congestive heart failure), NYHA class I, chronic, systolic (HCC)   ESRD (end stage renal disease) (HCC)   CAD (coronary artery disease)   DM (diabetes mellitus), type 2 with renal complications [facility])   Discharge Medications: Allergies as of 01/21/2017   No Known Allergies     Medication List    TAKE these medications   acetaminophen 500 MG tablet Commonly known as:  TYLENOL Take 1,000 mg by mouth every 6 (six) hours as needed for headache (pain).   amLODipine 10 MG tablet Commonly known as:  NORVASC Take 1 tablet (10 mg total) by mouth at bedtime.   amoxicillin-clavulanate 500-125 MG tablet Commonly known as:  AUGMENTIN Take 1 tablet (500 mg total) by mouth daily. AT NIGHT   aspirin 81 MG chewable tablet Chew 1 tablet (81 mg total) by mouth daily.   atorvastatin 80 MG tablet Commonly known as:  LIPITOR Take 1 tablet (80 mg total) by mouth daily. What changed:  when to take this   clopidogrel 75 MG tablet Commonly known as:  PLAVIX Take 1 tablet (75 mg total) by mouth daily. What changed:  when to take this   collagenase ointment Commonly known as:  SANTYL Apply topically daily. Start taking on:  01/22/2017   ezetimibe 10 MG tablet Commonly known as:  ZETIA Take 1 tablet (10 mg total) by mouth daily. What changed:  when to take this   hydrALAZINE 50 MG tablet Commonly known as:  APRESOLINE Take 1 tablet (50 mg total) by mouth 2  (two) times daily.   insulin pump Soln Inject into the skin continuous. Novolog   metoprolol succinate 25 MG 24 hr tablet Commonly known as:  TOPROL-XL Take 0.5 tablets (12.5 mg total) by mouth at bedtime. What changed:  when to take this   pantoprazole 40 MG tablet Commonly known as:  PROTONIX Take 40 mg by mouth daily with lunch.   vitamin C 500 MG tablet Commonly known as:  ASCORBIC ACID Take 1,000 mg by mouth daily.      Disposition and follow-up:   Kenneth [name] was discharged from [facility] in stable condition.  At the hospital follow up visit please address:  1.  Right foot wounds: Please ensure improvement of patients RLE wounds as well as compliance with NIGHTLY Augmentin. It is dosed at night to ensure it is taken after HD sessions. He has follow-up appointment with podiatry next week and may require debridement at their discretion.   2.  Labs / imaging needed at time of follow-up: None  3.  Pending labs/ test needing follow-up: None  Follow-up Appointments: Already scheduled podiatry appointment 5/17 HFU with PCP strongly encouraged  Hospital Course by problem list: Principal Problem:   Cellulitis in diabetic foot ([place]) Active Problems:   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[place])   Ischemic rest pain of lower extremity ([employer])   Pronation deformity of both feet   Shortness of breath   CHF (  congestive heart failure), NYHA class I, chronic, systolic (HCC)   ESRD (end stage renal disease) ([place])   CAD (coronary artery disease)   DM (diabetes mellitus), type 2 with renal complications ([place])   1. Right foot ulcers with surrounding cellulitis Kenneth [name] is a 61 y/o M with DM2, CAD s/p CABG x3, ?Hx of PAD and ESRD on HD who presented 5/7 with a several day history of progressive right foot redness, swelling and pain following popping a "blister." He endorsed fatigue and subjective fevers. He had seen is podiatrist a few days prior to presentation who prescribed  Amerigel and close follow-up. On admission he had 3 ulcerations on dorsal aspect of right foot which were with black eschar and surrounding erythema and warmth. There was no purulence or fluctuance. He was started on IV Vancomycin and Zosyn. Plain film without osteomyelitis, ESR normal and CRP modestly elevated. ABI's were normal however VVS consulted given patients history and diminished peripheral pulses. VVS felt ulcerations are secondary to small vessel disease secondary to diabetes however did recommend MRI which was negative for abscess or osteomyelitis. He was discharged home with an additional 7 day course of Augmentin. He already has follow-up scheduled with podiatry next week.   2. ESRD on HD MWF Nephrology consulted who dialyzed patient 5/9 on schedule without complication. He will attend his regularly scheduled HD session 5/11.   3. Type 2 diabetes With insulin pump. HbA1c during admission 7.1%. This was managed per pump protocol. Had hyperglycemic episodes however these normalized by the following reading.   Discharge Vitals:   BP (!) 146/59 (BP Location: Right Arm)   Pulse 69   Temp 98.6 F (37 C) (Oral)   Resp 18   Ht '5\' 6"'$  (1.676 m)   Wt 138 lb 4.8 oz (62.7 kg)   SpO2 97%   BMI 22.32 kg/m   Pertinent Labs, Studies, and Procedures:  Right foot plain film: No osteomyelitis. No appreciable soft tissue abnormality ABI: right 0.94, left 0.99.  MRI right foot: without osteomyelitis or abscess however did show soft tissue inflammation consistent with location of wound Wound cultures: few MSSA  Discharge Instructions: Kenneth [name], you were admitted for an infection of your foot. You were treated with several days of IV antibiotics however you will require additional antibiotics after discharge. Please complete your prescribed course of AUGMENTIN 1 pill once daily at night starting 5/10 until gone. You will need to follow-up with podiatry and your primary care physician after  discharge.   SignedEinar Gip, DO 01/21/2017, 7:54 AM   Pager: [phone]

## 2017-01-21 NOTE — Progress Notes (Signed)
[facility] Progress Note   Subjective:  Seen in room. R foot bandaged, but surrounding erythema appears to be improving. No CP, but occ dyspnea and cough. No fever, chills, N/V/D.  Objective Vitals:   01/20/17 1231 01/20/17 2115 01/20/17 2159 01/21/17 0540  BP: 136/64 (!) 147/80  (!) 146/59  Pulse: 62 92  69  Resp:  18  18  Temp:  97.7 F (36.5 C)  98.6 F (37 C)  TempSrc:  Oral  Oral  SpO2: 96% 98%  97%  Weight:   62.7 kg (138 lb 4.8 oz)   Height:       Physical Exam General: Well appearing, NAD. Heart: RRR; 2/6 systolic murmur. Lungs: CTAB Abdomen: soft, non-tender. Extremities: No LLE edema, R foot bandaged. Dialysis Access: LUE AVF + thrill/bruit  Additional Objective Labs: Basic Metabolic Panel:  Recent Labs Lab 01/19/17 0538 01/20/17 0540 01/21/17 0413  NA 136 133* 132*  K 4.1 5.0 4.9  CL 93* 94* 95*  CO2 31 25 26   GLUCOSE 78 266* 159*  BUN 25* 34* 20  CREATININE 4.56* 5.51* 4.90*  CALCIUM 9.0 8.5* 8.6*  PHOS  --  5.1*  --    Liver Function Tests:  Recent Labs Lab 01/18/17 1249 01/20/17 0540  AST 31  --   ALT 27  --   ALKPHOS 97  --   BILITOT 1.0  --   PROT 7.3  --   ALBUMIN 3.7 3.1*   CBC:  Recent Labs Lab 01/18/17 1249 01/19/17 0538 01/20/17 0540 01/21/17 0413  WBC 5.9 7.1 6.8 7.7  NEUTROABS 3.6  --   --   --   HGB 15.1 14.7 14.4 13.8  HCT 45.7 45.0 43.5 41.9  MCV 92.9 93.6 92.6 92.3  PLT 133* 152 183 159   Blood Culture    Component Value Date/Time   SDES WOUND RIGHT FOOT 01/18/2017 1521   SPECREQUEST NONE 01/18/2017 1521   CULT FEW STAPHYLOCOCCUS AUREUS 01/18/2017 1521   REPTSTATUS PENDING 01/18/2017 1521   CBG:  Recent Labs Lab 01/20/17 1719 01/20/17 1841 01/20/17 2115 01/21/17 0212 01/21/17 0800  GLUCAP 391* 352* 94 192* 104*   Medications: . piperacillin-tazobactam (ZOSYN)  IV 3.375 g (01/21/17 0755)  . vancomycin Stopped (01/20/17 1224)   . amLODipine  10 mg Oral QHS  . aspirin  81 mg  Oral Daily  . atorvastatin  80 mg Oral QHS  . clopidogrel  75 mg Oral Daily  . collagenase   Topical Daily  . ezetimibe  10 mg Oral Q lunch  . heparin  5,000 Units Subcutaneous Q8H  . hydrALAZINE  50 mg Oral BID  . insulin pump   Subcutaneous TID AC, HS, 0200  . metoprolol succinate  12.5 mg Oral Daily  . pantoprazole  40 mg Oral Q lunch    Dialysis Orders: MWF at [facility] [place] 4 hrs, BFR 350, DFR 800, EDW 60kg, 2K/2.5Ca bath, AVF - Heparin 4500 units q HD - No Mircera, VDRA, Sensipar, Venofer.  Assessment/Plan: 1. R foot cellulitis: On Vanc/Zosyn; ABIs normal. Vasc surgery does not feel PAD component. Per primary. 2. ESRD: Continue MWF schedule, next 5/11. 3. Hypertension/volume: BP borderline high, will try to challenge EDW with next HD. 4. Anemia: Hgb 13.8. No ESA needed for now. 5. Metabolic bone disease: Ca/Phos ok. Not on VDRA or binders. Monitor. 6. Nutrition: Alb 3.1, adding Nepro supps. 7. Type 2 DM (with insulin pump):  Glucose readings ok. Per primary. 8. CAD (Hx  CABG): No active complaints.On plavix. Per primary.  Veneta Penton, PA-C 01/21/2017, 8:46 AM  [facility] Pager: [phone]

## 2017-01-21 NOTE — Discharge Summary (Signed)
Name: Kenneth [name] MRN: [id number] DOB: 06-Nov-1955 61 y.o. PCP: [name], MD  Date of Admission: 01/18/2017  2:57 PM Date of Discharge: 01/21/2017 Attending Physician: Axel Filler, *  Discharge Diagnosis: 1. Right foot ulcerations with surrounding cellulitis 2. Type 2 diabetes 3. ESRD on HD  Principal Problem:   Cellulitis in diabetic foot ([place]) Active Problems:   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[place])   Ischemic rest pain of lower extremity (HCC)   Pronation deformity of both feet   Shortness of breath   CHF (congestive heart failure), NYHA class I, chronic, systolic (HCC)   ESRD (end stage renal disease) (HCC)   CAD (coronary artery disease)   DM (diabetes mellitus), type 2 with renal complications [facility])  Discharge Medications: Allergies as of 01/21/2017   No Known Allergies     Medication List    TAKE these medications   acetaminophen 500 MG tablet Commonly known as:  TYLENOL Take 1,000 mg by mouth every 6 (six) hours as needed for headache (pain).   amLODipine 10 MG tablet Commonly known as:  NORVASC Take 1 tablet (10 mg total) by mouth at bedtime.   amoxicillin-clavulanate 500-125 MG tablet Commonly known as:  AUGMENTIN Take 1 tablet (500 mg total) by mouth daily. AT NIGHT   aspirin 81 MG chewable tablet Chew 1 tablet (81 mg total) by mouth daily.   atorvastatin 80 MG tablet Commonly known as:  LIPITOR Take 1 tablet (80 mg total) by mouth daily. What changed:  when to take this   clopidogrel 75 MG tablet Commonly known as:  PLAVIX Take 1 tablet (75 mg total) by mouth daily. What changed:  when to take this   collagenase ointment Commonly known as:  SANTYL Apply topically daily. Start taking on:  01/22/2017   ezetimibe 10 MG tablet Commonly known as:  ZETIA Take 1 tablet (10 mg total) by mouth daily. What changed:  when to take this   hydrALAZINE 50 MG tablet Commonly known as:  APRESOLINE Take 1 tablet (50 mg total) by mouth 2 (two)  times daily.   insulin pump Soln Inject into the skin continuous. Novolog   metoprolol succinate 25 MG 24 hr tablet Commonly known as:  TOPROL-XL Take 0.5 tablets (12.5 mg total) by mouth at bedtime. What changed:  when to take this   pantoprazole 40 MG tablet Commonly known as:  PROTONIX Take 40 mg by mouth daily with lunch.   vitamin C 500 MG tablet Commonly known as:  ASCORBIC ACID Take 1,000 mg by mouth daily.      Disposition and follow-up:   Mr.Kenneth [name] was discharged from [facility] in stable condition.  At the hospital follow up visit please address:  1.  Right foot wounds: Please ensure improvement of patients RLE wounds as well as compliance with NIGHTLY Augmentin. It is dosed at night to ensure it is taken after HD sessions. He has follow-up appointment with podiatry next week and may require debridement at their discretion.   2.  Labs / imaging needed at time of follow-up: None  3.  Pending labs/ test needing follow-up: None  Follow-up Appointments: Already scheduled podiatry appointment 5/17 Provided contact information for several Endocrinology clinics HFU with PCP strongly encouraged  Hospital Course by problem list: Principal Problem:   Cellulitis in diabetic foot ([place]) Active Problems:   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[place])   Ischemic rest pain of lower extremity ([place])   Pronation deformity of both feet  Shortness of breath   CHF (congestive heart failure), NYHA class I, chronic, systolic (HCC)   ESRD (end stage renal disease) ([place])   CAD (coronary artery disease)   DM (diabetes mellitus), type 2 with renal complications ([place])   1. Right foot ulcers with surrounding cellulitis [name] is a 61 y/o M with DM2, CAD s/p CABG x3, ?Hx of PAD and ESRD on HD who presented 5/7 with a several day history of progressive right foot redness, swelling and pain following popping a "blister." He endorsed fatigue and subjective fevers. He had seen is  podiatrist a few days prior to presentation who prescribed Amerigel and close follow-up. On admission he had 3 ulcerations on dorsal aspect of right foot which were with black eschar and surrounding erythema and warmth. There was no purulence or fluctuance. He was started on IV Vancomycin and Zosyn. Plain film without osteomyelitis, ESR normal and CRP modestly elevated. ABI's were normal however VVS consulted given patients history and diminished peripheral pulses. VVS felt ulcerations are secondary to small vessel disease secondary to diabetes however did recommend MRI which was negative for abscess or osteomyelitis. He was discharged home with an additional 7 day course of Augmentin. He already has follow-up scheduled with podiatry next week.   2. ESRD on HD MWF Nephrology consulted who dialyzed patient 5/9 on schedule without complication. He will attend his regularly scheduled HD session 5/11.   3. Type 2 diabetes With insulin pump. HbA1c during admission 7.1%. This was managed per pump protocol however patient had hyperglycemic episodes. Believe patient would benefit from endocrinology referral and additional education on insulin pump. The family have been provided with several endocrinologists in the area.  Discharge Vitals:   BP (!) 146/59 (BP Location: Right Arm)   Pulse 69   Temp 98.6 F (37 C) (Oral)   Resp 18   Ht 5' 6" (1.676 m)   Wt 138 lb 4.8 oz (62.7 kg)   SpO2 97%   BMI 22.32 kg/m   Pertinent Labs, Studies, and Procedures:  Right foot plain film: No osteomyelitis. No appreciable soft tissue abnormality ABI: right 0.94, left 0.99.  MRI right foot: without osteomyelitis or abscess however did show soft tissue inflammation consistent with location of wound Wound cultures: few MSSA  Discharge Instructions: [name], you were admitted for an infection of your foot. You were treated with several days of IV antibiotics however you will require additional antibiotics after  discharge. Please complete your prescribed course of AUGMENTIN 1 pill once daily at night starting 5/10 until gone. You will need to follow-up with podiatry and your primary care physician after discharge.   SignedEinar Gip, DO 01/21/2017, 7:54 AM   Pager: [phone]

## 2017-01-21 NOTE — Progress Notes (Signed)
MRI cellulitis no deep space abscess or osteo infection  [name], MD [facility] Office: [phone] Pager: [phone]

## 2017-01-26 ENCOUNTER — Encounter: Payer: Self-pay | Admitting: Podiatry

## 2017-01-26 ENCOUNTER — Ambulatory Visit (INDEPENDENT_AMBULATORY_CARE_PROVIDER_SITE_OTHER): Payer: BLUE CROSS/BLUE SHIELD | Admitting: Podiatry

## 2017-01-26 DIAGNOSIS — E0842 Diabetes mellitus due to underlying condition with diabetic polyneuropathy: Secondary | ICD-10-CM

## 2017-01-26 DIAGNOSIS — L97401 Non-pressure chronic ulcer of unspecified heel and midfoot limited to breakdown of skin: Secondary | ICD-10-CM | POA: Diagnosis not present

## 2017-01-26 DIAGNOSIS — E1151 Type 2 diabetes mellitus with diabetic peripheral angiopathy without gangrene: Secondary | ICD-10-CM | POA: Diagnosis not present

## 2017-01-26 DIAGNOSIS — E08621 Diabetes mellitus due to underlying condition with foot ulcer: Secondary | ICD-10-CM | POA: Diagnosis not present

## 2017-01-26 NOTE — Patient Instructions (Signed)
Seen for follow up on right foot digital lesions. Noted of loss of dermal layer with necrotic base tissue on 2nd and 3rd toe right. Will try daily antibiotic foot soak regimen via [facility] pharmacy. Return in one week.

## 2017-01-26 NOTE — Progress Notes (Signed)
Subjective: 61 year old male presents for follow up on blistered toes on right foot. Stated that he was sent to ER last week for the right foot condition by his kidney dialysis team. He is using medication prescribed at the ER and taking antibiotics. He is not able to participate exercise program due to the foot condition. His blood sugar runs between 150-180. Also not taking Neurontin.   Review of Systems - General ROS: negative Ophthalmic ROS: Has had cataract surgery on right and left is under followed.Marland Kitchen ENT ROS: negative Allergy and Immunology ROS: negative Endocrine ROS: negative Respiratory ROS: no cough, shortness of breath, or wheezing Cardiovascular ROS: no chest pain or dyspnea on exertion Gastrointestinal ROS: no abdominal pain, change in bowel habits, or black or bloody stools Genito-Urinary ROS: no dysuria, trouble voiding, or hematuria Musculoskeletal ROS: Pain in lower limbs day or night. Neurological ROS: no TIA or stroke symptoms Dermatological ROS: negative.  Objective: Dermatologic: Dark brown discolored base with loss of top dermal layer at dorsal aspect 2nd and 3rd digit right foot.  The blistered dermal tissue has turned to dry dark brown tissue base. No exposure of deep tendon, joint or bone structures noted. Minimum edema and minimum erythema noted. Vascular: Pedal pulses are palpable. Cold feet. No edema or erythema. Neurologic: All epicritic and tactile sensations are grossly intact. Normal response to monofilament sensory testing.  Orthopedic: Plantar flexed 5th MPJ with painful callus right.  Assessment: Diabetic Neuropathy, microangiopathy. Skin maceration over the 2nd and 3rd right turning into dry gangrene at dermal layer.. Plantar flexed 5th MPJ with painful callus right foot.  Plan: Reviewed clinical findings and available options.  Wound cleansed with 50% H2O2 and Amerigel ointment dressing applied. Continue to stay off of feet. Will start on  daily antibiotic foot soak formulated by [facility]. Return in one week.

## 2017-01-28 ENCOUNTER — Encounter: Payer: Self-pay | Admitting: Podiatry

## 2017-01-28 ENCOUNTER — Other Ambulatory Visit (HOSPITAL_COMMUNITY): Payer: Self-pay | Admitting: Cardiology

## 2017-01-28 NOTE — Progress Notes (Signed)
NO SHOW

## 2017-02-02 ENCOUNTER — Ambulatory Visit (INDEPENDENT_AMBULATORY_CARE_PROVIDER_SITE_OTHER): Payer: BLUE CROSS/BLUE SHIELD | Admitting: Podiatry

## 2017-02-02 ENCOUNTER — Other Ambulatory Visit (HOSPITAL_COMMUNITY): Payer: Self-pay | Admitting: *Deleted

## 2017-02-02 ENCOUNTER — Encounter: Payer: Self-pay | Admitting: Podiatry

## 2017-02-02 DIAGNOSIS — L97522 Non-pressure chronic ulcer of other part of left foot with fat layer exposed: Secondary | ICD-10-CM

## 2017-02-02 DIAGNOSIS — E0842 Diabetes mellitus due to underlying condition with diabetic polyneuropathy: Secondary | ICD-10-CM

## 2017-02-02 MED ORDER — EZETIMIBE 10 MG PO TABS: 10.0000 mg | ORAL_TABLET | Freq: Every day | ORAL | 3 refills | Status: DC

## 2017-02-02 NOTE — Patient Instructions (Signed)
Follow up on right foot lesion. Some improvement noted. Continue with antibiotic foot soak and Santyl dressing.  Return in 2 weeks.

## 2017-02-02 NOTE — Progress Notes (Signed)
Subjective: 61year old male presents for follow up on macerated toes with gangrenous soft tissue 2nd and 3rd toe right foot. He is using daily antibiotic foot soak for a week now. He is running out of medication.  He apparently used more Gentamycin powder than required amount in foot soak.  Will call to send more medication supply.  HPI:  Initially seen on 01/14/17 for macerated skin with erythema over 2nd and 3rd digit right foot. He was initially seen by his PCP prior to referred to this office for this acute foot lesion.  Since then his Kidney dialysis team sent him ER on 01/18/17 for cellulitis. He is using medication prescribed at the ER and taking antibiotics, which was prescribed on 01/14/17.Marland Kitchen He is not able to participate exercise program due to the foot condition. His blood sugar runs between 150-180. Also not taking Neurontin.   Objective: Dermatologic:  Wounds are dry and narrowing down on dark brown discolored tissue base over the 2nd and 3rd toe right. . Loss of dermal layer that is replaced with dark necrotic tissue over dorsal aspect 2nd and 3rd digit right foot.  Minimum edema and minimum erythema noted. Vascular: Pedal pulses are palpable. Cold feet. No edema or erythema. Neurologic: All epicritic and tactile sensations are grossly intact. Normal response to monofilament sensory testing.  Orthopedic: Plantar flexed 5th MPJ with painful callus right.  Assessment: Diabetic Neuropathy, microangiopathy. Skin maceration over the 2nd and 3rd right turning into dry gangrene at dermal layer.. Some improvement noted on both lesions.   Plan: Reviewed clinical findings and available options.  Wound cleansed with 50% H2O2 and Amerigel ointment dressing applied. Continue to stay off of feet. Continue with daily antibiotic foot soak formulated by [facility] pharmacy. Keep dressing over the area with Santyl ointment dressing.  50% H2O2 cleansing solution instructed and 51ml saline  bottle dispensed..  Return in 2 week.

## 2017-02-05 ENCOUNTER — Other Ambulatory Visit (HOSPITAL_COMMUNITY): Payer: Self-pay | Admitting: Cardiology

## 2017-02-05 DIAGNOSIS — I160 Hypertensive urgency: Secondary | ICD-10-CM

## 2017-02-05 MED ORDER — AMLODIPINE BESYLATE 10 MG PO TABS: 10.0000 mg | ORAL_TABLET | Freq: Every day | ORAL | 3 refills | Status: DC

## 2017-02-09 ENCOUNTER — Other Ambulatory Visit (HOSPITAL_COMMUNITY): Payer: Self-pay | Admitting: Cardiology

## 2017-02-09 MED ORDER — METOPROLOL SUCCINATE ER 25 MG PO TB24: 12.5000 mg | ORAL_TABLET | Freq: Every day | ORAL | 1 refills | Status: DC

## 2017-02-11 NOTE — Progress Notes (Signed)
This encounter was created in error - please disregard.

## 2017-02-16 ENCOUNTER — Ambulatory Visit (INDEPENDENT_AMBULATORY_CARE_PROVIDER_SITE_OTHER): Payer: BLUE CROSS/BLUE SHIELD | Admitting: Podiatry

## 2017-02-16 DIAGNOSIS — L97503 Non-pressure chronic ulcer of other part of unspecified foot with necrosis of muscle: Secondary | ICD-10-CM | POA: Diagnosis not present

## 2017-02-16 DIAGNOSIS — E0842 Diabetes mellitus due to underlying condition with diabetic polyneuropathy: Secondary | ICD-10-CM

## 2017-02-16 DIAGNOSIS — E08621 Diabetes mellitus due to underlying condition with foot ulcer: Secondary | ICD-10-CM

## 2017-02-16 DIAGNOSIS — I96 Gangrene, not elsewhere classified: Secondary | ICD-10-CM

## 2017-02-16 NOTE — Patient Instructions (Signed)
Seen for right foot lesion. No improvement noted. Continue with daily antibiotic soaking and dressing.  Will try Wound care center referral. We will make arrangement.

## 2017-02-16 NOTE — Progress Notes (Signed)
Subjective: 61year old male presents for follow up onmacerated toes with gangrenous soft tissue 2nd and 3rd toe right foot. Stated that he had having problem with blood glucose level for the past several weeks  blood glucose goes down to the level of 30-40 at times. Over the weekend he was in ER for Hypoglycemic shock. Stated that this morning the glucose level was at 50. He has an appointment to see Endocrinologist next week.   He continues to use daily antibiotic foot soak with dressing change.   HPI:  Initially visit was on 01/14/17 for macerated skin with erythema over 2nd and 3rd digit right foot.  The following week he was treated again at the ER.   Objective: Dermatologic:  Both 2nd and 3rd digit dorsal wound appears to be enlarged with thicker necrotic tissue covering most of dorsal skin.  Initial dark necrotic tissue is replaced with thicker and wider yellow necrotic base. Increased erythema along the margins of wound also noted. Vascular: Pedal pulses are palpable. Cold feet. No edema or erythema. Neurologic: All epicritic and tactile sensations are grossly intact. Normal response to monofilament sensory testing.  Orthopedic: Plantar flexed 5th MPJ with painful callus right.  Assessment: Diabetic Neuropathy, microangiopathy. Macerated wound over the 2nd and 3rd digit right foot failed to improve. The wound has enlarged gangrenous surface.  Plan: Reviewed clinical findings and failure to improve possible due to complicated diabetic condition. Reviewed the need for more extensive wound care regimen and referral to wound care center.  Continue to stay off of feet. Continue with daily antibiotic foot soak formulated by [facility] pharmacy. Referral made to [facility] health wound care center. Will follow up in 2 weeks to see he is receiving the care as planned.

## 2017-02-17 ENCOUNTER — Encounter: Payer: Self-pay | Admitting: Podiatry

## 2017-02-21 ENCOUNTER — Emergency Department (HOSPITAL_BASED_OUTPATIENT_CLINIC_OR_DEPARTMENT_OTHER)
Admission: EM | Admit: 2017-02-21 | Discharge: 2017-02-22 | Disposition: A | Discharge status: Home / Self Care | Payer: Medicare Other | Attending: Emergency Medicine | Admitting: Emergency Medicine

## 2017-02-21 ENCOUNTER — Encounter (HOSPITAL_BASED_OUTPATIENT_CLINIC_OR_DEPARTMENT_OTHER): Payer: Self-pay | Admitting: Emergency Medicine

## 2017-02-21 DIAGNOSIS — E1159 Type 2 diabetes mellitus with other circulatory complications: Secondary | ICD-10-CM | POA: Insufficient documentation

## 2017-02-21 DIAGNOSIS — K640 First degree hemorrhoids: Secondary | ICD-10-CM

## 2017-02-21 DIAGNOSIS — I2510 Atherosclerotic heart disease of native coronary artery without angina pectoris: Secondary | ICD-10-CM | POA: Diagnosis not present

## 2017-02-21 DIAGNOSIS — N186 End stage renal disease: Secondary | ICD-10-CM | POA: Diagnosis not present

## 2017-02-21 DIAGNOSIS — K625 Hemorrhage of anus and rectum: Secondary | ICD-10-CM | POA: Diagnosis present

## 2017-02-21 DIAGNOSIS — Z992 Dependence on renal dialysis: Secondary | ICD-10-CM | POA: Diagnosis not present

## 2017-02-21 DIAGNOSIS — E1129 Type 2 diabetes mellitus with other diabetic kidney complication: Secondary | ICD-10-CM | POA: Insufficient documentation

## 2017-02-21 DIAGNOSIS — Z87891 Personal history of nicotine dependence: Secondary | ICD-10-CM | POA: Diagnosis not present

## 2017-02-21 DIAGNOSIS — R197 Diarrhea, unspecified: Secondary | ICD-10-CM | POA: Diagnosis not present

## 2017-02-21 DIAGNOSIS — I5022 Chronic systolic (congestive) heart failure: Secondary | ICD-10-CM | POA: Diagnosis not present

## 2017-02-21 LAB — CBC WITH DIFFERENTIAL/PLATELET
BASOS ABS: 0 10*3/uL (ref 0.0–0.1)
Basophils Relative: 0 %
Eosinophils Absolute: 0.3 10*3/uL (ref 0.0–0.7)
Eosinophils Relative: 4 %
HCT: 35.8 % — ABNORMAL LOW (ref 39.0–52.0)
Hemoglobin: 11.9 g/dL — ABNORMAL LOW (ref 13.0–17.0)
LYMPHS PCT: 18 %
Lymphs Abs: 1.4 10*3/uL (ref 0.7–4.0)
MCH: 30.1 pg (ref 26.0–34.0)
MCHC: 33.2 g/dL (ref 30.0–36.0)
MCV: 90.4 fL (ref 78.0–100.0)
MONO ABS: 1.1 10*3/uL — AB (ref 0.1–1.0)
MONOS PCT: 15 %
Neutro Abs: 4.7 10*3/uL (ref 1.7–7.7)
Neutrophils Relative %: 63 %
PLATELETS: 144 10*3/uL — AB (ref 150–400)
RBC: 3.96 MIL/uL — ABNORMAL LOW (ref 4.22–5.81)
RDW: 13.8 % (ref 11.5–15.5)
WBC: 7.6 10*3/uL (ref 4.0–10.5)

## 2017-02-21 LAB — COMPREHENSIVE METABOLIC PANEL
ALBUMIN: 3.6 g/dL (ref 3.5–5.0)
ALK PHOS: 90 U/L (ref 38–126)
ALT: 27 U/L (ref 17–63)
AST: 26 U/L (ref 15–41)
Anion gap: 13 (ref 5–15)
BILIRUBIN TOTAL: 0.8 mg/dL (ref 0.3–1.2)
BUN: 48 mg/dL — AB (ref 6–20)
CALCIUM: 8.6 mg/dL — AB (ref 8.9–10.3)
CO2: 28 mmol/L (ref 22–32)
Chloride: 93 mmol/L — ABNORMAL LOW (ref 101–111)
Creatinine, Ser: 6.49 mg/dL — ABNORMAL HIGH (ref 0.61–1.24)
GFR calc Af Amer: 10 mL/min — ABNORMAL LOW (ref >60)
GFR calc non Af Amer: 8 mL/min — ABNORMAL LOW (ref >60)
GLUCOSE: 152 mg/dL — AB (ref 65–99)
Potassium: 3.8 mmol/L (ref 3.5–5.1)
Sodium: 134 mmol/L — ABNORMAL LOW (ref 135–145)
TOTAL PROTEIN: 7.1 g/dL (ref 6.5–8.1)

## 2017-02-21 LAB — OCCULT BLOOD X 1 CARD TO LAB, STOOL: Fecal Occult Bld: NEGATIVE

## 2017-02-21 MED ORDER — HYDROCORTISONE 2.5 % RE CREA: TOPICAL_CREAM | RECTAL | 0 refills | Status: DC

## 2017-02-21 NOTE — ED Notes (Signed)
Pt ambulatory to BR, a family member on either side to steady.

## 2017-02-21 NOTE — ED Provider Notes (Signed)
Emergency Department Provider Note  By signing my name below, I, Marcello Moores, attest that this documentation has been prepared under the direction and in the presence of Long, Wonda Olds, MD. Electronically Signed: Marcello Moores, ED Scribe. 02/21/17. 5:10 PM.  I have reviewed the triage vital signs and the nursing notes.   HISTORY  Chief Complaint Diarrhea and Rectal Bleeding   HPI Comments: Kenneth [name] is a 61 y.o. male with PMH of CHF, ESRD, and DM who presents to the Emergency Department complaining of moderate, gradually resolving rectal bleeding, rectal pain, bowel incontinence, and multiple episodes of severe diarrhea that began two days ago. Pt has associated hematochezia, mild abdominal pain, and fever. Blood with bright red. No black tarry stools. Pt indicates that he had severe constipation on Friday 02/19/2017 that he passed using 1 senna tea bag. Yesterday he indicates that he went to the bathroom 20 times. He states that while he was sleeping last night he had an episode of diarrhea and was unable to make it to the bathroom. He had similar symptoms today, although they were less severe. He is currently taking plavix. The pt denies a PMHx of intestinal bleeding.   Past Medical History:  Diagnosis Date  . Acute pulmonary edema ([place]) 05/31/2016  . Acute respiratory failure with hypoxemia ([place]) 05/31/2016  . CHF (congestive heart failure) ([place])   . 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 ESRD (end stage renal disease) on dialysis [facility])    "MWF; [place]" (01/18/2017)  . HCAP (healthcare-associated pneumonia)   . Ischemic rest pain of lower extremity ([place]) 02/06/2015  . 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 NSTEMI (non-ST elevated myocardial infarction) ([place])   . Pain in lower limb 02/06/2015  . Pain in the chest   . Pleural effusion   . 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 Shortness of breath     Patient Active Problem List   Diagnosis  Date Noted  . Cellulitis in diabetic foot ([place]) 01/18/2017  . Abdominal pain 12/16/2016  . Age-related nuclear cataract of right eye 12/09/2016  . Pressure injury of skin 11/03/2016  . DM (diabetes mellitus), type 2 with renal complications ([place]) 91/[id number]/[id number]  . ESRD (end stage renal disease) ([place]) 10/30/2016  . CAD (coronary artery disease) 10/30/2016  . Gastroesophageal reflux disease 08/18/2016  . 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 CHF (congestive heart failure), NYHA class I, chronic, systolic ([place])   . Bursitis of left shoulder 05/31/2016  . Shortness of breath   . 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 Ischemic rest pain of lower extremity ([place]) 02/06/2015    Past Surgical History:  Procedure Laterality Date  . AV FISTULA PLACEMENT Left 06/22/2016   Procedure: ARTERIOVENOUS (AV) FISTULA CREATION LEFT UPPER ARM;  Surgeon: Rosetta Posner, MD;  Location: [facility];  Service: Vascular;  Laterality: Left;  . CARDIAC CATHETERIZATION N/A 05/31/2016   Procedure: Left Heart Cath and Coronary Angiography;  Surgeon: Jettie Booze, MD;  Location: [facility];  Service: Cardiovascular;  Laterality: N/A;  . CARDIAC CATHETERIZATION N/A 05/31/2016   Procedure: Right Heart Cath;  Surgeon: Jettie Booze, MD;  Location: [place] [facility];  Service: Cardiovascular;  Laterality: N/A;  . CARDIAC CATHETERIZATION N/A 05/31/2016   Procedure: IABP Insertion;  Surgeon: Jettie Booze, MD;  Location: [place] [facility];  Service: Cardiovascular;  Laterality: N/A;  .  CORONARY ARTERY BYPASS GRAFT N/A 06/05/2016   Procedure: CORONARY ARTERY BYPASS GRAFTING (CABG) x3 LIMA to LAD -SVG to OM -SVG to RCA;  Surgeon: Ivin Poot, MD;  Location: [facility];  Service: Open Heart Surgery;  Laterality: N/A;  . INSERTION OF DIALYSIS CATHETER N/A 06/05/2016   Procedure: INSERTION OF DIALYSIS/trialysis CATHETER;  Surgeon: Ivin Poot, MD;  Location: [facility];  Service: Vascular;  Laterality: N/A;  . INSERTION OF DIALYSIS CATHETER Right 06/13/2016   Procedure: INSERTION OF DIALYSIS CATHETER RIGHT INTERNAL JUGULAR;  Surgeon: Conrad La Quinta, MD;  Location: [facility];  Service: Vascular;  Laterality: Right;  . INTRAOPERATIVE TRANSESOPHAGEAL ECHOCARDIOGRAM N/A 06/05/2016   Procedure: INTRAOPERATIVE TRANSESOPHAGEAL ECHOCARDIOGRAM;  Surgeon: Ivin Poot, MD;  Location: [facility];  Service: Open Heart Surgery;  Laterality: N/A;  . LEFT HEART CATH AND CORS/GRAFTS ANGIOGRAPHY N/A 11/03/2016   Procedure: Left Heart Cath and Cors/Grafts Angiography;  Surgeon: Troy Sine, MD;  Location: [place] [facility];  Service: Cardiovascular;  Laterality: N/A;    Current Outpatient Rx  . Order #: [id number] Class: Historical Med  . Order #: [id number] Class: Normal  . Order #: [id number] Class: Print  . Order #: [id number] Class: Normal  . Order #: [id number] Class: Normal  . Order #: [id number] Class: Normal  . Order #: [id number] Class: Normal  . Order #: [id number] Class: No Print  . Order #: [id number] Class: Print  . Order #: [id number] Class: Historical Med  . Order #: [id number] Class: Normal  . Order #: [id number] Class: Historical Med  . Order #: [id number] Class: Historical Med    Allergies Patient has no known allergies.  Family History  Problem Relation Age of Onset  . CAD Father   . Colon cancer Father   . Diabetes Brother     Social History Social History  Substance Use Topics  . Smoking status: Former Research scientist (life sciences)  . Smokeless tobacco: Never Used  . Alcohol use No    Review of Systems  Constitutional: Positive for fever. Eyes: No visual changes. ENT: No sore throat. Cardiovascular: Denies chest pain. Respiratory: Denies shortness of breath. Gastrointestinal: No abdominal pain.  No nausea, no vomiting.  Positive for diarrhea.  No constipation. Positive for rectal pain. Positive for hematochezia. Positive for rectal bleeding. Positive for  bowel incontinence.  Genitourinary: Negative for dysuria. Musculoskeletal: Negative for back pain. Skin: Negative for rash. Neurological: Negative for headaches, focal weakness or numbness.  10-point ROS otherwise negative.  ____________________________________________   PHYSICAL EXAM:  VITAL SIGNS: ED Triage Vitals  Enc Vitals Group     BP 02/21/17 1639 136/64     Pulse Rate 02/21/17 1639 72     Resp 02/21/17 1639 16     Temp 02/21/17 1639 98.1 F (36.7 C)     Temp Source 02/21/17 1639 Oral     SpO2 02/21/17 1639 98 %     Weight 02/21/17 1639 130 lb (59 kg)     Height 02/21/17 1639 5\' 6"  (1.676 m)     Pain Score 02/21/17 1638 5   Constitutional: Alert and oriented. Well appearing and in no acute distress. Eyes: Conjunctivae are normal. Head: Atraumatic. Nose: No congestion/rhinnorhea. Mouth/Throat: Mucous membranes are moist.  Oropharynx non-erythematous. Neck: No stridor.   Cardiovascular: Normal rate, regular rhythm. Good peripheral circulation. Grossly normal heart sounds.   Respiratory: Normal respiratory effort.  No retractions. Lungs CTAB. Gastrointestinal: Soft and nontender. No distention. External, non-thrombosed hemorrhoids visible. No obvious fissure. No hard stool in the rectum. Brown soft stool with no BRB or melena.  Musculoskeletal: No lower extremity tenderness nor edema. No gross deformities of extremities. Neurologic:  Normal speech and language. No gross focal neurologic deficits are appreciated.  Skin:  Skin is warm, dry and intact. No rash noted.   LABS (all labs ordered are listed, but only abnormal results are displayed)  Labs Reviewed  COMPREHENSIVE METABOLIC PANEL - Abnormal; Notable for the following:       Result Value   Sodium 134 (*)    Chloride 93 (*)    Glucose, Bld 152 (*)    BUN 48 (*)    Creatinine, Ser 6.49 (*)    Calcium 8.6 (*)    GFR calc non Af Amer 8 (*)    GFR calc Af Amer 10 (*)    All other components within normal  limits  CBC WITH DIFFERENTIAL/PLATELET - Abnormal; Notable for the following:    RBC 3.96 (*)    Hemoglobin 11.9 (*)    HCT 35.8 (*)    Platelets 144 (*)    Monocytes Absolute 1.1 (*)    All other components within normal limits  OCCULT BLOOD X 1 CARD TO LAB, STOOL  POC OCCULT BLOOD, ED    ____________________________________________   PROCEDURES  Procedure(s) performed:   Procedures  None ____________________________________________   INITIAL IMPRESSION / ASSESSMENT AND PLAN / ED COURSE  Pertinent labs & imaging results that were available during my care of the patient were reviewed by me and considered in my medical decision making (see chart for details).  Patient with hemorrhoids on exam. No fecal impaction on rectal exam. [name] labs are near baseline with slight decrease in hemoglobin but nothing close to requiring blood transfusion. He is receiving dialysis tomorrow. Fecal occult negative. Suspect that blood he's seeing is secondary to hemorrhoids. Will prescribe hemorrhoid medication and sitz baths. Will follow with his primary care physician.  At this time, I do not feel there is any life-threatening condition present. I have reviewed and discussed all results (EKG, imaging, lab, urine as appropriate), exam findings with patient. I have reviewed nursing notes and appropriate previous records.  I feel the patient is safe to be discharged home without further emergent workup. Discussed usual and customary return precautions. Patient and family (if present) verbalize understanding and are comfortable with this plan.  Patient will follow-up with their primary care provider. If they do not have a primary care provider, information for follow-up has been provided to them. All questions have been answered.  ____________________________________________  FINAL CLINICAL IMPRESSION(S) / ED DIAGNOSES  Final diagnoses:  Diarrhea, unspecified type  Rectal bleeding  Grade I  hemorrhoids     MEDICATIONS GIVEN DURING THIS VISIT:  Medications - No data to display   NEW OUTPATIENT MEDICATIONS STARTED DURING THIS VISIT:  Discharge Medication List as of 02/21/2017  5:58 PM    START taking these medications   Details  hydrocortisone (ANUSOL-HC) 2.5 % rectal cream Apply rectally 2 times daily, Print       I personally performed the services described in this documentation, which was scribed in my presence. The recorded information has been reviewed and is accurate.   Note:  This document was prepared using Dragon voice recognition software and may include unintentional dictation errors.  Nanda Quinton, MD Emergency Medicine    Long, Wonda Olds, MD 02/22/17 [phone]

## 2017-02-21 NOTE — ED Notes (Signed)
MD at bedside discussing results with patient and family. 

## 2017-02-21 NOTE — ED Triage Notes (Signed)
Pt reports that he was constipated so he used 1 senna tea bag on Friday. Pt reports "too much" diarrhea and rectal bleeding since then. He reports feeling weak.

## 2017-02-21 NOTE — Discharge Instructions (Signed)
You were seen in the ED today with diarrhea and blood in the stool. This is likely from hemorrhoids. We are providing steroid cream for treatment. Follow up with your PCP in the coming week.   Return to the ED with any abdominal pain or worsening bleeding.

## 2017-02-21 NOTE — ED Notes (Signed)
ED Provider at bedside. 

## 2017-02-25 ENCOUNTER — Encounter (HOSPITAL_BASED_OUTPATIENT_CLINIC_OR_DEPARTMENT_OTHER): Payer: Medicare Other | Attending: Internal Medicine

## 2017-02-25 DIAGNOSIS — E11621 Type 2 diabetes mellitus with foot ulcer: Secondary | ICD-10-CM | POA: Diagnosis not present

## 2017-02-25 DIAGNOSIS — E1151 Type 2 diabetes mellitus with diabetic peripheral angiopathy without gangrene: Secondary | ICD-10-CM | POA: Diagnosis not present

## 2017-02-25 DIAGNOSIS — E1122 Type 2 diabetes mellitus with diabetic chronic kidney disease: Secondary | ICD-10-CM | POA: Diagnosis not present

## 2017-02-25 DIAGNOSIS — L97513 Non-pressure chronic ulcer of other part of right foot with necrosis of muscle: Secondary | ICD-10-CM | POA: Diagnosis not present

## 2017-02-25 DIAGNOSIS — N186 End stage renal disease: Secondary | ICD-10-CM | POA: Insufficient documentation

## 2017-02-25 DIAGNOSIS — Z9641 Presence of insulin pump (external) (internal): Secondary | ICD-10-CM | POA: Diagnosis not present

## 2017-02-25 DIAGNOSIS — E1142 Type 2 diabetes mellitus with diabetic polyneuropathy: Secondary | ICD-10-CM | POA: Diagnosis not present

## 2017-02-25 DIAGNOSIS — I509 Heart failure, unspecified: Secondary | ICD-10-CM | POA: Diagnosis not present

## 2017-02-25 DIAGNOSIS — I252 Old myocardial infarction: Secondary | ICD-10-CM | POA: Insufficient documentation

## 2017-02-25 DIAGNOSIS — Z794 Long term (current) use of insulin: Secondary | ICD-10-CM | POA: Insufficient documentation

## 2017-02-25 DIAGNOSIS — I2510 Atherosclerotic heart disease of native coronary artery without angina pectoris: Secondary | ICD-10-CM | POA: Insufficient documentation

## 2017-03-02 ENCOUNTER — Ambulatory Visit: Payer: BLUE CROSS/BLUE SHIELD | Admitting: Podiatry

## 2017-03-04 DIAGNOSIS — E11621 Type 2 diabetes mellitus with foot ulcer: Secondary | ICD-10-CM | POA: Diagnosis not present

## 2017-03-11 DIAGNOSIS — E11621 Type 2 diabetes mellitus with foot ulcer: Secondary | ICD-10-CM | POA: Diagnosis not present

## 2017-03-18 ENCOUNTER — Encounter (HOSPITAL_BASED_OUTPATIENT_CLINIC_OR_DEPARTMENT_OTHER): Payer: Medicare Other | Attending: Internal Medicine

## 2017-03-18 DIAGNOSIS — Z9641 Presence of insulin pump (external) (internal): Secondary | ICD-10-CM | POA: Diagnosis not present

## 2017-03-18 DIAGNOSIS — L97512 Non-pressure chronic ulcer of other part of right foot with fat layer exposed: Secondary | ICD-10-CM | POA: Diagnosis not present

## 2017-03-18 DIAGNOSIS — I252 Old myocardial infarction: Secondary | ICD-10-CM | POA: Diagnosis not present

## 2017-03-18 DIAGNOSIS — E11621 Type 2 diabetes mellitus with foot ulcer: Secondary | ICD-10-CM | POA: Insufficient documentation

## 2017-03-18 DIAGNOSIS — E11622 Type 2 diabetes mellitus with other skin ulcer: Secondary | ICD-10-CM | POA: Diagnosis not present

## 2017-03-18 DIAGNOSIS — N186 End stage renal disease: Secondary | ICD-10-CM | POA: Diagnosis not present

## 2017-03-18 DIAGNOSIS — E1151 Type 2 diabetes mellitus with diabetic peripheral angiopathy without gangrene: Secondary | ICD-10-CM | POA: Diagnosis not present

## 2017-03-18 DIAGNOSIS — Z794 Long term (current) use of insulin: Secondary | ICD-10-CM | POA: Insufficient documentation

## 2017-03-18 DIAGNOSIS — I2510 Atherosclerotic heart disease of native coronary artery without angina pectoris: Secondary | ICD-10-CM | POA: Diagnosis not present

## 2017-03-25 DIAGNOSIS — E11621 Type 2 diabetes mellitus with foot ulcer: Secondary | ICD-10-CM | POA: Diagnosis not present

## 2017-03-29 ENCOUNTER — Other Ambulatory Visit (HOSPITAL_COMMUNITY): Payer: Self-pay | Admitting: Cardiology

## 2017-04-01 DIAGNOSIS — E11621 Type 2 diabetes mellitus with foot ulcer: Secondary | ICD-10-CM | POA: Diagnosis not present

## 2017-04-11 ENCOUNTER — Emergency Department (HOSPITAL_COMMUNITY): Payer: Medicare Other

## 2017-04-11 ENCOUNTER — Inpatient Hospital Stay (HOSPITAL_COMMUNITY)
Admission: EM | Admit: 2017-04-11 | Discharge: 2017-04-13 | DRG: 291 | Disposition: A | Discharge status: Home / Self Care | Payer: Medicare Other | Attending: Internal Medicine | Admitting: Internal Medicine

## 2017-04-11 ENCOUNTER — Encounter (HOSPITAL_COMMUNITY): Payer: Self-pay | Admitting: Emergency Medicine

## 2017-04-11 DIAGNOSIS — Z8249 Family history of ischemic heart disease and other diseases of the circulatory system: Secondary | ICD-10-CM

## 2017-04-11 DIAGNOSIS — L97519 Non-pressure chronic ulcer of other part of right foot with unspecified severity: Secondary | ICD-10-CM | POA: Diagnosis present

## 2017-04-11 DIAGNOSIS — F17210 Nicotine dependence, cigarettes, uncomplicated: Secondary | ICD-10-CM | POA: Diagnosis present

## 2017-04-11 DIAGNOSIS — I169 Hypertensive crisis, unspecified: Secondary | ICD-10-CM

## 2017-04-11 DIAGNOSIS — I5022 Chronic systolic (congestive) heart failure: Secondary | ICD-10-CM | POA: Diagnosis present

## 2017-04-11 DIAGNOSIS — I252 Old myocardial infarction: Secondary | ICD-10-CM | POA: Diagnosis not present

## 2017-04-11 DIAGNOSIS — I25810 Atherosclerosis of coronary artery bypass graft(s) without angina pectoris: Secondary | ICD-10-CM | POA: Diagnosis present

## 2017-04-11 DIAGNOSIS — Z9641 Presence of insulin pump (external) (internal): Secondary | ICD-10-CM | POA: Diagnosis present

## 2017-04-11 DIAGNOSIS — R55 Syncope and collapse: Secondary | ICD-10-CM | POA: Diagnosis not present

## 2017-04-11 DIAGNOSIS — L97509 Non-pressure chronic ulcer of other part of unspecified foot with unspecified severity: Secondary | ICD-10-CM | POA: Diagnosis present

## 2017-04-11 DIAGNOSIS — R079 Chest pain, unspecified: Secondary | ICD-10-CM | POA: Diagnosis present

## 2017-04-11 DIAGNOSIS — I2510 Atherosclerotic heart disease of native coronary artery without angina pectoris: Secondary | ICD-10-CM | POA: Diagnosis present

## 2017-04-11 DIAGNOSIS — E875 Hyperkalemia: Secondary | ICD-10-CM | POA: Diagnosis present

## 2017-04-11 DIAGNOSIS — Z794 Long term (current) use of insulin: Secondary | ICD-10-CM | POA: Diagnosis not present

## 2017-04-11 DIAGNOSIS — R4182 Altered mental status, unspecified: Secondary | ICD-10-CM | POA: Diagnosis present

## 2017-04-11 DIAGNOSIS — R69 Illness, unspecified: Secondary | ICD-10-CM | POA: Diagnosis not present

## 2017-04-11 DIAGNOSIS — L03115 Cellulitis of right lower limb: Secondary | ICD-10-CM | POA: Diagnosis present

## 2017-04-11 DIAGNOSIS — Z833 Family history of diabetes mellitus: Secondary | ICD-10-CM

## 2017-04-11 DIAGNOSIS — E1122 Type 2 diabetes mellitus with diabetic chronic kidney disease: Secondary | ICD-10-CM | POA: Diagnosis not present

## 2017-04-11 DIAGNOSIS — R7989 Other specified abnormal findings of blood chemistry: Secondary | ICD-10-CM

## 2017-04-11 DIAGNOSIS — I509 Heart failure, unspecified: Secondary | ICD-10-CM

## 2017-04-11 DIAGNOSIS — N185 Chronic kidney disease, stage 5: Secondary | ICD-10-CM | POA: Diagnosis not present

## 2017-04-11 DIAGNOSIS — E11621 Type 2 diabetes mellitus with foot ulcer: Secondary | ICD-10-CM | POA: Diagnosis present

## 2017-04-11 DIAGNOSIS — E10621 Type 1 diabetes mellitus with foot ulcer: Secondary | ICD-10-CM | POA: Diagnosis present

## 2017-04-11 DIAGNOSIS — I259 Chronic ischemic heart disease, unspecified: Secondary | ICD-10-CM | POA: Diagnosis not present

## 2017-04-11 DIAGNOSIS — I200 Unstable angina: Secondary | ICD-10-CM | POA: Diagnosis not present

## 2017-04-11 DIAGNOSIS — R072 Precordial pain: Secondary | ICD-10-CM | POA: Diagnosis not present

## 2017-04-11 DIAGNOSIS — I5023 Acute on chronic systolic (congestive) heart failure: Secondary | ICD-10-CM | POA: Diagnosis present

## 2017-04-11 DIAGNOSIS — Z951 Presence of aortocoronary bypass graft: Secondary | ICD-10-CM | POA: Diagnosis not present

## 2017-04-11 DIAGNOSIS — Z7982 Long term (current) use of aspirin: Secondary | ICD-10-CM

## 2017-04-11 DIAGNOSIS — Z7902 Long term (current) use of antithrombotics/antiplatelets: Secondary | ICD-10-CM | POA: Diagnosis not present

## 2017-04-11 DIAGNOSIS — I161 Hypertensive emergency: Secondary | ICD-10-CM | POA: Diagnosis not present

## 2017-04-11 DIAGNOSIS — K219 Gastro-esophageal reflux disease without esophagitis: Secondary | ICD-10-CM | POA: Diagnosis present

## 2017-04-11 DIAGNOSIS — E118 Type 2 diabetes mellitus with unspecified complications: Secondary | ICD-10-CM | POA: Diagnosis not present

## 2017-04-11 DIAGNOSIS — I2583 Coronary atherosclerosis due to lipid rich plaque: Secondary | ICD-10-CM | POA: Diagnosis not present

## 2017-04-11 DIAGNOSIS — D631 Anemia in chronic kidney disease: Secondary | ICD-10-CM | POA: Diagnosis present

## 2017-04-11 DIAGNOSIS — N186 End stage renal disease: Secondary | ICD-10-CM | POA: Diagnosis present

## 2017-04-11 DIAGNOSIS — N189 Chronic kidney disease, unspecified: Secondary | ICD-10-CM

## 2017-04-11 DIAGNOSIS — E1051 Type 1 diabetes mellitus with diabetic peripheral angiopathy without gangrene: Secondary | ICD-10-CM | POA: Diagnosis present

## 2017-04-11 DIAGNOSIS — E8770 Fluid overload, unspecified: Secondary | ICD-10-CM | POA: Diagnosis present

## 2017-04-11 DIAGNOSIS — E1022 Type 1 diabetes mellitus with diabetic chronic kidney disease: Secondary | ICD-10-CM | POA: Diagnosis present

## 2017-04-11 DIAGNOSIS — R748 Abnormal levels of other serum enzymes: Secondary | ICD-10-CM | POA: Diagnosis not present

## 2017-04-11 DIAGNOSIS — Z800 Family history of malignant neoplasm of digestive organs: Secondary | ICD-10-CM | POA: Diagnosis not present

## 2017-04-11 DIAGNOSIS — I5032 Chronic diastolic (congestive) heart failure: Secondary | ICD-10-CM | POA: Diagnosis not present

## 2017-04-11 DIAGNOSIS — I132 Hypertensive heart and chronic kidney disease with heart failure and with stage 5 chronic kidney disease, or end stage renal disease: Principal | ICD-10-CM | POA: Diagnosis present

## 2017-04-11 DIAGNOSIS — R778 Other specified abnormalities of plasma proteins: Secondary | ICD-10-CM | POA: Diagnosis present

## 2017-04-11 DIAGNOSIS — I160 Hypertensive urgency: Secondary | ICD-10-CM | POA: Diagnosis present

## 2017-04-11 DIAGNOSIS — Z992 Dependence on renal dialysis: Secondary | ICD-10-CM

## 2017-04-11 DIAGNOSIS — I25118 Atherosclerotic heart disease of native coronary artery with other forms of angina pectoris: Secondary | ICD-10-CM | POA: Diagnosis present

## 2017-04-11 HISTORY — DX: Non-pressure chronic ulcer of other part of unspecified foot with unspecified severity: L97.509

## 2017-04-11 HISTORY — DX: Type 2 diabetes mellitus with foot ulcer: E11.621

## 2017-04-11 LAB — POTASSIUM: POTASSIUM: 5 mmol/L (ref 3.5–5.1)

## 2017-04-11 LAB — CBG MONITORING, ED: Glucose-Capillary: 66 mg/dL (ref 65–99)

## 2017-04-11 LAB — COMPREHENSIVE METABOLIC PANEL
ALK PHOS: 112 U/L (ref 38–126)
ALT: 33 U/L (ref 17–63)
ANION GAP: 11 (ref 5–15)
AST: 37 U/L (ref 15–41)
Albumin: 3.7 g/dL (ref 3.5–5.0)
BILIRUBIN TOTAL: 0.7 mg/dL (ref 0.3–1.2)
BUN: 33 mg/dL — ABNORMAL HIGH (ref 6–20)
CALCIUM: 8.8 mg/dL — AB (ref 8.9–10.3)
CO2: 27 mmol/L (ref 22–32)
CREATININE: 5.56 mg/dL — AB (ref 0.61–1.24)
Chloride: 98 mmol/L — ABNORMAL LOW (ref 101–111)
GFR, EST AFRICAN AMERICAN: 12 mL/min — AB (ref >60)
GFR, EST NON AFRICAN AMERICAN: 10 mL/min — AB (ref >60)
Glucose, Bld: 98 mg/dL (ref 65–99)
Potassium: 6.1 mmol/L — ABNORMAL HIGH (ref 3.5–5.1)
Sodium: 136 mmol/L (ref 135–145)
TOTAL PROTEIN: 7 g/dL (ref 6.5–8.1)

## 2017-04-11 LAB — CBC WITH DIFFERENTIAL/PLATELET
Basophils Absolute: 0 10*3/uL (ref 0.0–0.1)
Basophils Relative: 1 %
EOS ABS: 0.3 10*3/uL (ref 0.0–0.7)
Eosinophils Relative: 6 %
HCT: 33 % — ABNORMAL LOW (ref 39.0–52.0)
HEMOGLOBIN: 10.8 g/dL — AB (ref 13.0–17.0)
LYMPHS ABS: 1.6 10*3/uL (ref 0.7–4.0)
Lymphocytes Relative: 26 %
MCH: 30.4 pg (ref 26.0–34.0)
MCHC: 32.7 g/dL (ref 30.0–36.0)
MCV: 93 fL (ref 78.0–100.0)
MONO ABS: 0.6 10*3/uL (ref 0.1–1.0)
MONOS PCT: 10 %
NEUTROS PCT: 57 %
Neutro Abs: 3.5 10*3/uL (ref 1.7–7.7)
Platelets: 151 10*3/uL (ref 150–400)
RBC: 3.55 MIL/uL — ABNORMAL LOW (ref 4.22–5.81)
RDW: 15.2 % (ref 11.5–15.5)
WBC: 6 10*3/uL (ref 4.0–10.5)

## 2017-04-11 LAB — LIPASE, BLOOD: LIPASE: 94 U/L — AB (ref 11–51)

## 2017-04-11 LAB — BRAIN NATRIURETIC PEPTIDE: B NATRIURETIC PEPTIDE 5: 1724.5 pg/mL — AB (ref 0.0–100.0)

## 2017-04-11 LAB — I-STAT ARTERIAL BLOOD GAS, ED
Acid-Base Excess: 5 mmol/L — ABNORMAL HIGH (ref 0.0–2.0)
BICARBONATE: 29.6 mmol/L — AB (ref 20.0–28.0)
O2 Saturation: 98 %
PCO2 ART: 40.8 mmHg (ref 32.0–48.0)
TCO2: 31 mmol/L (ref 0–100)
pH, Arterial: 7.469 — ABNORMAL HIGH (ref 7.350–7.450)
pO2, Arterial: 95 mmHg (ref 83.0–108.0)

## 2017-04-11 LAB — TROPONIN I
Troponin I: 0.08 ng/mL (ref <0.03)
Troponin I: 0.08 ng/mL (ref <0.03)

## 2017-04-11 MED ORDER — HYDROCODONE-ACETAMINOPHEN 5-325 MG PO TABS: 1.0000 | ORAL_TABLET | ORAL | Status: DC | PRN

## 2017-04-11 MED ORDER — SODIUM CHLORIDE 0.9% FLUSH
3.0000 mL | Freq: Two times a day (BID) | INTRAVENOUS | Status: DC
  Administered 2017-04-12: 3 mL via INTRAVENOUS

## 2017-04-11 MED ORDER — SODIUM CHLORIDE 0.9% FLUSH: 3.0000 mL | INTRAVENOUS | Status: DC | PRN

## 2017-04-11 MED ORDER — CLOPIDOGREL BISULFATE 75 MG PO TABS
75.0000 mg | ORAL_TABLET | Freq: Every day | ORAL | Status: DC
  Administered 2017-04-12 – 2017-04-13 (×2): 75 mg via ORAL
  Filled 2017-04-11 (×2): qty 1

## 2017-04-11 MED ORDER — EZETIMIBE 10 MG PO TABS
10.0000 mg | ORAL_TABLET | Freq: Every day | ORAL | Status: DC
  Administered 2017-04-12 – 2017-04-13 (×2): 10 mg via ORAL
  Filled 2017-04-11 (×2): qty 1

## 2017-04-11 MED ORDER — NITROGLYCERIN IN D5W 200-5 MCG/ML-% IV SOLN
0.0000 ug/min | Freq: Once | INTRAVENOUS | Status: AC
  Administered 2017-04-11: 5 ug/min via INTRAVENOUS
  Filled 2017-04-11: qty 250

## 2017-04-11 MED ORDER — SODIUM POLYSTYRENE SULFONATE 15 GM/60ML PO SUSP
15.0000 g | Freq: Once | ORAL | Status: AC
  Administered 2017-04-11: 15 g via ORAL
  Filled 2017-04-11: qty 60

## 2017-04-11 MED ORDER — INSULIN ASPART 100 UNIT/ML ~~LOC~~ SOLN
0.0000 [IU] | SUBCUTANEOUS | Status: DC
  Administered 2017-04-12: 3 [IU] via SUBCUTANEOUS

## 2017-04-11 MED ORDER — HYDRALAZINE HCL 20 MG/ML IJ SOLN: 10.0000 mg | INTRAMUSCULAR | Status: DC | PRN

## 2017-04-11 MED ORDER — CALCIUM GLUCONATE 10 % IV SOLN
1.0000 g | Freq: Once | INTRAVENOUS | Status: AC
  Administered 2017-04-11: 1 g via INTRAVENOUS
  Filled 2017-04-11: qty 10

## 2017-04-11 MED ORDER — FUROSEMIDE 10 MG/ML IJ SOLN
40.0000 mg | Freq: Once | INTRAMUSCULAR | Status: AC
  Administered 2017-04-11: 40 mg via INTRAVENOUS
  Filled 2017-04-11: qty 4

## 2017-04-11 MED ORDER — SODIUM CHLORIDE 0.9 % IV SOLN: 250.0000 mL | INTRAVENOUS | Status: DC | PRN

## 2017-04-11 MED ORDER — HYDRALAZINE HCL 50 MG PO TABS: 50.0000 mg | ORAL_TABLET | Freq: Two times a day (BID) | ORAL | Status: DC

## 2017-04-11 MED ORDER — GABAPENTIN 300 MG PO CAPS
300.0000 mg | ORAL_CAPSULE | Freq: Every day | ORAL | Status: DC
  Administered 2017-04-11 – 2017-04-12 (×2): 300 mg via ORAL
  Filled 2017-04-11 (×2): qty 1

## 2017-04-11 MED ORDER — METOPROLOL SUCCINATE ER 25 MG PO TB24
12.5000 mg | ORAL_TABLET | Freq: Every day | ORAL | Status: DC
  Administered 2017-04-12: 12.5 mg via ORAL
  Filled 2017-04-11 (×3): qty 1

## 2017-04-11 MED ORDER — MORPHINE SULFATE (PF) 4 MG/ML IV SOLN: 2.0000 mg | INTRAVENOUS | Status: DC | PRN

## 2017-04-11 MED ORDER — ACETAMINOPHEN 325 MG PO TABS: 650.0000 mg | ORAL_TABLET | ORAL | Status: DC | PRN

## 2017-04-11 MED ORDER — HYDRALAZINE HCL 20 MG/ML IJ SOLN
5.0000 mg | Freq: Once | INTRAMUSCULAR | Status: AC
  Administered 2017-04-11: 5 mg via INTRAVENOUS
  Filled 2017-04-11: qty 1

## 2017-04-11 MED ORDER — ATORVASTATIN CALCIUM 80 MG PO TABS
80.0000 mg | ORAL_TABLET | Freq: Every day | ORAL | Status: DC
  Administered 2017-04-12: 80 mg via ORAL
  Filled 2017-04-11: qty 1

## 2017-04-11 MED ORDER — CITALOPRAM HYDROBROMIDE 20 MG PO TABS
20.0000 mg | ORAL_TABLET | Freq: Every day | ORAL | Status: DC
  Administered 2017-04-12 – 2017-04-13 (×2): 20 mg via ORAL
  Filled 2017-04-11 (×2): qty 1

## 2017-04-11 MED ORDER — HEPARIN SODIUM (PORCINE) 5000 UNIT/ML IJ SOLN
5000.0000 [IU] | Freq: Three times a day (TID) | INTRAMUSCULAR | Status: DC
Start: 2017-04-11 — End: 2017-04-13
  Administered 2017-04-12 – 2017-04-13 (×4): 5000 [IU] via SUBCUTANEOUS
  Filled 2017-04-11 (×4): qty 1

## 2017-04-11 MED ORDER — PANTOPRAZOLE SODIUM 40 MG PO TBEC
40.0000 mg | DELAYED_RELEASE_TABLET | Freq: Every day | ORAL | Status: DC
  Administered 2017-04-12 – 2017-04-13 (×2): 40 mg via ORAL
  Filled 2017-04-11 (×2): qty 1

## 2017-04-11 MED ORDER — NITROGLYCERIN IN D5W 200-5 MCG/ML-% IV SOLN
0.0000 ug/min | INTRAVENOUS | Status: DC
  Administered 2017-04-11: 100 ug/min via INTRAVENOUS
  Administered 2017-04-11: 5 ug/min via INTRAVENOUS
  Administered 2017-04-12: 85 ug/min via INTRAVENOUS
  Filled 2017-04-11: qty 250

## 2017-04-11 MED ORDER — LOSARTAN POTASSIUM-HCTZ 50-12.5 MG PO TABS: 1.0000 | ORAL_TABLET | Freq: Every day | ORAL | Status: DC

## 2017-04-11 MED ORDER — ONDANSETRON HCL 4 MG/2ML IJ SOLN: 4.0000 mg | Freq: Four times a day (QID) | INTRAMUSCULAR | Status: DC | PRN

## 2017-04-11 MED ORDER — ASPIRIN 81 MG PO CHEW
81.0000 mg | CHEWABLE_TABLET | Freq: Every day | ORAL | Status: DC
  Administered 2017-04-12 – 2017-04-13 (×2): 81 mg via ORAL
  Filled 2017-04-11 (×2): qty 1

## 2017-04-11 MED ORDER — VITAMIN C 500 MG PO TABS
1000.0000 mg | ORAL_TABLET | Freq: Every day | ORAL | Status: DC
  Administered 2017-04-12 – 2017-04-13 (×2): 1000 mg via ORAL
  Filled 2017-04-11 (×2): qty 2

## 2017-04-11 MED ORDER — ASPIRIN 81 MG PO CHEW
324.0000 mg | CHEWABLE_TABLET | Freq: Once | ORAL | Status: AC
  Administered 2017-04-11: 324 mg via ORAL
  Filled 2017-04-11: qty 4

## 2017-04-11 NOTE — ED Notes (Signed)
Patient transported to X-ray 

## 2017-04-11 NOTE — ED Provider Notes (Signed)
[facility] DEPT Provider Note   CSN: [id number] Arrival date & time: 04/11/17  1515     History   Chief Complaint Chief Complaint  Patient presents with  . Chest Pain    HPI Kenneth [name] is a 61 y.o. male.  61 year old male with past medical history of heart [name], Kenneth [name] prior to arrival. Patient's wife stated that he was fine at church and then also and had onset of the pain and they had to go home because of it. He did not get any better after Tylenol social brought him here for further evaluation. Here patient has not purchased pain in history.      Past Medical History:  Diagnosis Date  . Acute pulmonary edema ([facility] [name]) 05/31/2016  . Acute respiratory [name] with hypoxemia (Kenn[facility] [name]) 05/31/2016  . CHF (congestive heart [name]) ([place])   . 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Kenn[facility] [name]) 02/06/2015  . ESRD (end Kenneth renal disease) on dialysis [facility])    "MWF; [place]" (01/18/2017)  . HCAP (healthcare-associated pneumonia)   . Ischemic rest pain of lower extremity ([place]) 02/06/2015  . 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 NSTEMI (non-ST elevated myocardial infarction) ([place])   . Pain in lower limb 02/06/2015  . Pain in the chest   . Pleural effusion   . 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 Shortness of breath     Patient Active Problem List   Diagnosis Date Noted  . Cellulitis in diabetic foot ([facility] [name]) 01/18/2017  . Abdominal pain 12/16/2016  . Age-related nuclear cataract of right eye 12/09/2016  . Pressure injury of skin 11/03/2016  . DM (diabetes mellitus), type 2 with renal complications ([place]) 78/29/5621  . ESRD (end Kenneth renal disease) (Kenneth [name]) 10/30/2016  . CAD (coronary artery disease) 10/30/2016  . Gastroesophageal reflux disease 08/18/2016  . 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 CHF (congestive heart [name]), NYHA class I, chronic, systolic  ([place])   . Bursitis of left shoulder 05/31/2016  . Shortness of breath   . 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[place]) 02/06/2015  . Ischemic rest pain of lower extremity (Kenneth [name]) 02/06/2015    Past Surgical History:  Procedure Laterality Date  . AV FISTULA PLACEMENT Left 06/22/2016   Procedure: ARTERIOVENOUS (AV) FISTULA CREATION LEFT UPPER ARM;  Surgeon: Rosetta Posner, MD;  Location: [place];  Service: Vascular;  Laterality: Left;  . CARDIAC CATHETERIZATION N/A 05/31/2016   Procedure: Left Heart Cath and Coronary Angiography;  Surgeon: Kenneth Booze, MD;  Location: [place] [facility];  Service: Cardiovascular;  Laterality: N/A;  . CARDIAC CATHETERIZATION N/A 05/31/2016   Procedure: Right Heart Cath;  Surgeon: Kenneth Booze, MD;  Location: [place] [facility];  Service: Cardiovascular;  Laterality: N/A;  . CARDIAC CATHETERIZATION N/A 05/31/2016   Procedure: IABP Insertion;  Surgeon: [facility] Booze, MD;  Location: [place] [facility];  Service: Cardiovascular;  Laterality: N/A;  . CORONARY ARTERY BYPASS GRAFT N/A 06/05/2016   Procedure: CORONARY ARTERY BYPASS GRAFTING (CABG) x3 LIMA to LAD -SVG to OM -SVG to RCA;  Surgeon: [facility] Poot, MD;  Location: [place];  Service: Open Heart Surgery;  Laterality: N/A;  . INSERTION OF DIALYSIS CATHETER N/A 06/05/2016   Procedure: INSERTION OF DIALYSIS/trialysis CATHETER;  Surgeon: [facility] Poot, MD;  Location: [place];  Service: Vascular;  Laterality: N/A;  . INSERTION OF DIALYSIS CATHETER Right 06/13/2016   Procedure: INSERTION OF DIALYSIS CATHETER RIGHT INTERNAL JUGULAR;  Surgeon: Conrad Conway, MD;  Location: [place];  Service: Vascular;  Laterality: Right;  . INTRAOPERATIVE TRANSESOPHAGEAL ECHOCARDIOGRAM N/A 06/05/2016   Procedure: INTRAOPERATIVE TRANSESOPHAGEAL ECHOCARDIOGRAM;  Surgeon: [facility] Poot, MD;  Location: [place];  Service: Open Heart Surgery;  Laterality: N/A;  . LEFT  HEART CATH AND CORS/GRAFTS ANGIOGRAPHY N/A 11/03/2016   Procedure: Left Heart Cath and Cors/Grafts Angiography;  Surgeon: [facility] Sine, MD;  Location: [place] [facility];  Service: Cardiovascular;  Laterality: N/A;       Home Medications    Prior to Admission medications   Medication Sig Start Date End Date Taking? Authorizing Provider  aspirin 81 MG chewable tablet Chew 1 tablet (81 mg total) by mouth daily. 11/06/16  Yes Caren Griffins, MD  atorvastatin (LIPITOR) 80 MG tablet TAKE ONE TABLET BY MOUTH ONCE DAILY 03/31/17  Yes Larey Dresser, MD  citalopram (CELEXA) 20 MG tablet Take 20 mg by mouth daily. 02/25/17  Yes [provider]  clopidogrel (PLAVIX) 75 MG tablet Take 1 tablet (75 mg total) by mouth daily. Patient taking differently: Take 75 mg by mouth daily with lunch.  01/12/17  Yes Bensimhon, Shaune Pascal, MD  ezetimibe (ZETIA) 10 MG tablet Take 1 tablet (10 mg total) by mouth daily. 02/02/17  Yes Larey Dresser, MD  furosemide (LASIX) 80 MG tablet Take 80 mg by mouth 2 (two) times daily.   Yes [provider]  gabapentin (NEURONTIN) 300 MG capsule Take 300 mg by mouth at bedtime. 02/11/17  Yes [provider]  hydrALAZINE (APRESOLINE) 50 MG tablet Take 1 tablet (50 mg total) by mouth 2 (two) times daily. 12/17/16  Yes Hongalgi, Lenis Dickinson, MD  Insulin Human (INSULIN PUMP) SOLN Inject into the skin continuous. NOVOLOG   Yes [provider]  losartan-hydrochlorothiazide (HYZAAR) 50-12.5 MG tablet Take 1 tablet by mouth daily. 02/11/17  Yes [provider]  metoprolol succinate (TOPROL-XL) 25 MG 24 hr tablet Take 0.5 tablets (12.5 mg total) by mouth at bedtime. 02/09/17  Yes Bensimhon, Shaune Pascal, MD  pantoprazole (PROTONIX) 40 MG tablet Take 40 mg by mouth daily with lunch. 01/07/17  Yes [provider]  TOUJEO SOLOSTAR 300 UNIT/ML SOPN 4 Units.  03/26/17  Yes [provider]  vitamin C (ASCORBIC ACID) 500 MG tablet Take 1,000 mg by  mouth daily.   Yes [provider]  amLODipine (NORVASC) 10 MG tablet Take 1 tablet (10 mg total) by mouth at bedtime. Patient not taking: Reported on 04/11/2017 02/05/17   Bensimhon, Shaune Pascal, MD  collagenase (SANTYL) ointment Apply topically daily. Patient not taking: Reported on 04/11/2017 01/22/17   Molt, Romelle Starcher, DO  hydrocortisone (ANUSOL-HC) 2.5 % rectal cream Apply rectally 2 times daily Patient not taking: Reported on 04/11/2017 02/21/17   Long, Wonda Olds, MD    Family History Family History  Problem Relation Age of Onset  . CAD Father   . Colon cancer Father   . Diabetes Brother     Social History Social History  Substance Use Topics  . Smoking status: Former Research scientist (life sciences)  . Smokeless tobacco: Never Used  . Alcohol use No     Allergies   Patient has no known allergies.   Review of Systems Review of Systems  Unable to perform ROS: Patient nonverbal     Physical Exam Updated Vital Signs BP (!) 184/69  Pulse (!) 59   Temp 97.6 F (36.4 C) (Oral)   Resp 17   SpO2 99%   Physical Exam  Constitutional: He is oriented to person, place, and time. He appears well-developed and well-nourished.  HENT:  Head: Normocephalic and atraumatic.  Eyes: Conjunctivae and EOM are normal.  Neck: Normal range of motion.  Cardiovascular: Normal rate and regular rhythm.   Significantly hypertensive  Pulmonary/Chest: Effort normal. No respiratory distress. He has rales.  Abdominal: Soft. He exhibits no distension.  Musculoskeletal: Normal range of motion. He exhibits no edema or deformity.  Neurological: He is alert and oriented to person, place, and time. No cranial nerve deficit.  Skin: Skin is warm. No pallor.  Nursing note and vitals reviewed.    ED Treatments / Results  Labs (all labs ordered are listed, but only abnormal results are displayed) Labs Reviewed  CBC WITH DIFFERENTIAL/PLATELET - Abnormal; Notable for the following:       Result Value   RBC 3.55 (*)     Hemoglobin 10.8 (*)    HCT 33.0 (*)    All other components within normal limits  COMPREHENSIVE METABOLIC PANEL - Abnormal; Notable for the following:    Potassium 6.1 (*)    Chloride 98 (*)    BUN 33 (*)    Creatinine, Ser 5.56 (*)    Calcium 8.8 (*)    GFR calc non Af Amer 10 (*)    GFR calc Af Amer 12 (*)    All other components within normal limits  TROPONIN I - Abnormal; Notable for the following:    Troponin I 0.08 (*)    All other components within normal limits  LIPASE, BLOOD - Abnormal; Notable for the following:    Lipase 94 (*)    All other components within normal limits  BRAIN NATRIURETIC PEPTIDE - Abnormal; Notable for the following:    B Natriuretic Peptide 1,724.5 (*)    All other components within normal limits    EKG  EKG Interpretation  Date/Time:  Sunday April 11 2017 15:22:40 EDT Ventricular Rate:  59 PR Interval:    QRS Duration: 111 QT Interval:  491 QTC Calculation: 487 R Axis:   104 Text Interpretation:  Sinus rhythm Ventricular premature complex Left atrial enlargement Right axis deviation Consider left ventricular hypertrophy Repol abnrm, probable ischemia, lateral leads Anterior ST elevation, probably due to LVH No significant change since last tracing in april 2018 Confirmed by Merrily Pew [phone]) on 04/11/2017 3:34:03 PM       Radiology Dg Chest 2 View  Result Date: 04/11/2017 CLINICAL DATA:  Patient with chest pain. EXAM: CHEST  2 VIEW COMPARISON:  Chest radiograph 01/18/2017. FINDINGS: Monitoring leads overlie the patient. Stable cardiomegaly status post median sternotomy. Bilateral perihilar interstitial opacities. Small bilateral pleural effusions. No pneumothorax. IMPRESSION: Mild bilateral interstitial pulmonary opacities raising the possibility of mild edema. Small bilateral pleural effusions. Electronically Signed   By: Lovey Newcomer M.D.   On: 04/11/2017 16:55    Procedures Procedures (including critical care time)  CRITICAL  CARE Performed by: Merrily Pew Total critical care time: 35 minutes Critical care time was exclusive of separately billable procedures and treating other patients. Critical care was necessary to treat or prevent imminent or life-threatening deterioration. Critical care was time spent personally by me on the following activities: development of treatment plan with patient and/or surrogate as well as nursing, discussions with consultants, evaluation of patient's response to treatment, examination of patient, obtaining history from patient  or surrogate, ordering and performing treatments and interventions, ordering and review of laboratory studies, ordering and review of radiographic studies, pulse oximetry and re-evaluation of patient's condition.   Medications Ordered in ED Medications  calcium gluconate 1 g in sodium chloride 0.9 % 100 mL IVPB (1 g Intravenous New Bag/Given 04/11/17 1907)  hydrALAZINE (APRESOLINE) injection 5 mg (5 mg Intravenous Given 04/11/17 1703)  nitroGLYCERIN 50 mg in dextrose 5 % 250 mL (0.2 mg/mL) infusion (5 mcg/min Intravenous New Bag/Given 04/11/17 1853)  aspirin chewable tablet 324 mg (324 mg Oral Given 04/11/17 1853)  furosemide (LASIX) injection 40 mg (40 mg Intravenous Given 04/11/17 1853)  sodium polystyrene (KAYEXALATE) 15 GM/60ML suspension 15 g (15 g Oral Given 04/11/17 1853)     Initial Impression / Assessment and Plan / ED Course  I have reviewed the triage vital signs and the nursing notes.  Pertinent labs & imaging results that were available during my care of the patient were reviewed by me and considered in my medical decision making (see chart for details).    Suspect hypertensive crisis. We'll give her a dose of hydralazine to see if this helps. Also with fluid overload [name] secondary from kidney [name]. We'll evaluate for all of these. No indication for invasive ventilation at this time.  Labs and imaging consistent with likely fluid overload  secondary to heart [name] versus worsening renal [name]. Discussed with cardiology does not think it's a primary ACS event more likely related to the hypertension and fluid overload and recommend admission to medicine. Discussed with medicine and waiting a page from nephrology to discuss possible dialysis.  Nephrology to see, but likely dialysis in AM (Dr. Jonnie Finner).  Final Clinical Impressions(s) / ED Diagnoses   Final diagnoses:  Hypertensive crisis  Altered mental status, unspecified altered mental status type  Heart [name], unspecified HF chronicity, unspecified heart [name] type [facility])      Atharv Barriere, Corene Cornea, MD 04/12/17 0028

## 2017-04-11 NOTE — ED Notes (Signed)
Pt family member gave him dinner, also given water and applesauce

## 2017-04-11 NOTE — H&P (Signed)
History and Physical    Kenneth [name] LGX:[id number] DOB: 12-Jul-1956 DOA: 04/11/2017  PCP: [name], MD   Patient coming from: Home  Chief Complaint: Chest pain  HPI: Kenneth [name] is a 61 y.o. male with medical history significant for end-stage renal disease on hemodialysis, chronic systolic CHF, hypertension, insulin-dependent diabetes mellitus, coronary artery disease with history of CABG, and chronic right foot wound, now presenting to the emergency department for evaluation of chest pain that began earlier in the day. Patient reports that he had been in his usual state and was at rest earlier today when he noted the insidious onset of pain across the left chest. This is described as moderate to severe in intensity, nonradiating, constant, with no alleviating or exacerbating factors identified, and associated with shortness of breath and nausea. Patient reports experiencing similar symptoms previously. He has known coronary artery disease and underwent CABG in September 2017. He denies fevers, chills, or cough. Continues to urinate. Denies dysuria or flank pain.  ED Course: Upon arrival to the ED, patient is found to be afebrile, saturating adequately on room air, hypertensive to 202/77, and with normal heart rate and respirations. EKG features a sinus rhythm with PVC, LVH by voltage criteria, and secondary polarization abnormality. Chest x-ray is notable for mild bilateral interstitial opacities suggestive of mild edema. Chemistry panel reveals a potassium 6.1, BUN 33. CBC is notable for a normocytic anemia with hemoglobin of 10.8, down from 11.9 last month. Troponin is elevated to 0.08 and BNP is elevated to 1725. Patient was treated with 324 mg of aspirin in the emergency department and started on nitroglycerin infusion. He was also given 1 g of calcium gluconate, 40 mg IV Lasix, 5 mg IV hydralazine, and 15 g of Kayexalate. Blood pressure improved slightly with the nitroglycerin infusion.  Cardiology was consulted by the ED physician and advised for medical admission to trend the troponins. Nephrology was also consulted by the ED physician and plans to arrange for dialysis tomorrow, but requests a call back overnight if acute respiratory distress ensues. Patient will be admitted to the stepdown unit for ongoing evaluation and management of chest pain with known coronary artery disease and elevated troponins, in the setting of suspected volume overload with hypertensive urgency.  Review of Systems:  All other systems reviewed and apart from HPI, are negative.  Past Medical History:  Diagnosis Date  . Acute pulmonary edema ([place]) 05/31/2016  . Acute respiratory failure with hypoxemia ([place]) 05/31/2016  . CHF (congestive heart failure) ([place])   . 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[place]) 02/06/2015  . ESRD (end stage renal disease) on dialysis [facility]    "MWF; [facility]" (01/18/2017)  . HCAP (healthcare-associated pneumonia)   . Ischemic rest pain of lower extremity ([place]) 02/06/2015  . 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 NSTEMI (non-ST elevated myocardial infarction) ([place])   . Pain in lower limb 02/06/2015  . Pain in the chest   . Pleural effusion   . 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 Shortness of breath     Past Surgical History:  Procedure Laterality Date  . AV FISTULA PLACEMENT Left 06/22/2016   Procedure: ARTERIOVENOUS (AV) FISTULA CREATION LEFT UPPER ARM;  Surgeon: Rosetta Posner, MD;  Location: [place];  Service: Vascular;  Laterality: Left;  . CARDIAC CATHETERIZATION N/A 05/31/2016   Procedure: Left Heart Cath and Coronary Angiography;  Surgeon: Jettie Booze, MD;  Location: [place] CV LAB;  Service: Cardiovascular;  Laterality: N/A;  . CARDIAC CATHETERIZATION N/A 05/31/2016   Procedure: Right Heart Cath;  Surgeon: Jettie Booze, MD;  Location: [place] CV LAB;  Service: Cardiovascular;  Laterality: N/A;  . CARDIAC CATHETERIZATION N/A  05/31/2016   Procedure: IABP Insertion;  Surgeon: Jettie Booze, MD;  Location: [place] CV LAB;  Service: Cardiovascular;  Laterality: N/A;  . CORONARY ARTERY BYPASS GRAFT N/A 06/05/2016   Procedure: CORONARY ARTERY BYPASS GRAFTING (CABG) x3 LIMA to LAD -SVG to OM -SVG to RCA;  Surgeon: Ivin Poot, MD;  Location: [place];  Service: Open Heart Surgery;  Laterality: N/A;  . INSERTION OF DIALYSIS CATHETER N/A 06/05/2016   Procedure: INSERTION OF DIALYSIS/trialysis CATHETER;  Surgeon: Ivin Poot, MD;  Location: [place];  Service: Vascular;  Laterality: N/A;  . INSERTION OF DIALYSIS CATHETER Right 06/13/2016   Procedure: INSERTION OF DIALYSIS CATHETER RIGHT INTERNAL JUGULAR;  Surgeon: Conrad Minidoka, MD;  Location: [place];  Service: Vascular;  Laterality: Right;  . INTRAOPERATIVE TRANSESOPHAGEAL ECHOCARDIOGRAM N/A 06/05/2016   Procedure: INTRAOPERATIVE TRANSESOPHAGEAL ECHOCARDIOGRAM;  Surgeon: Ivin Poot, MD;  Location: [place];  Service: Open Heart Surgery;  Laterality: N/A;  . LEFT HEART CATH AND CORS/GRAFTS ANGIOGRAPHY N/A 11/03/2016   Procedure: Left Heart Cath and Cors/Grafts Angiography;  Surgeon: Troy Sine, MD;  Location: [place] CV LAB;  Service: Cardiovascular;  Laterality: N/A;     reports that he has quit smoking. He has never used smokeless tobacco. He reports that he does not drink alcohol or use drugs.  No Known Allergies  Family History  Problem Relation Age of Onset  . CAD Father   . Colon cancer Father   . Diabetes Brother      Prior to Admission medications   Medication Sig Start Date End Date Taking? Authorizing Provider  aspirin 81 MG chewable tablet Chew 1 tablet (81 mg total) by mouth daily. 11/06/16  Yes Caren Griffins, MD  atorvastatin (LIPITOR) 80 MG tablet TAKE ONE TABLET BY MOUTH ONCE DAILY 03/31/17  Yes Larey Dresser, MD  citalopram (CELEXA) 20 MG tablet Take 20 mg by mouth daily. 02/25/17  Yes [provider]  clopidogrel (PLAVIX)  75 MG tablet Take 1 tablet (75 mg total) by mouth daily. Patient taking differently: Take 75 mg by mouth daily with lunch.  01/12/17  Yes Bensimhon, Shaune Pascal, MD  ezetimibe (ZETIA) 10 MG tablet Take 1 tablet (10 mg total) by mouth daily. 02/02/17  Yes Larey Dresser, MD  furosemide (LASIX) 80 MG tablet Take 80 mg by mouth 2 (two) times daily.   Yes [provider]  gabapentin (NEURONTIN) 300 MG capsule Take 300 mg by mouth at bedtime. 02/11/17  Yes [provider]  hydrALAZINE (APRESOLINE) 50 MG tablet Take 1 tablet (50 mg total) by mouth 2 (two) times daily. 12/17/16  Yes Hongalgi, Lenis Dickinson, MD  Insulin Human (INSULIN PUMP) SOLN Inject into the skin continuous. NOVOLOG   Yes [provider]  losartan-hydrochlorothiazide (HYZAAR) 50-12.5 MG tablet Take 1 tablet by mouth daily. 02/11/17  Yes [provider]  metoprolol succinate (TOPROL-XL) 25 MG 24 hr tablet Take 0.5 tablets (12.5 mg total) by mouth at bedtime. 02/09/17  Yes Bensimhon, Shaune Pascal, MD  pantoprazole (PROTONIX) 40 MG tablet Take 40 mg by mouth daily with lunch. 01/07/17  Yes [provider]  TOUJEO SOLOSTAR 300 UNIT/ML SOPN 4 Units.  03/26/17  Yes [provider]  vitamin C (ASCORBIC ACID) 500 MG  tablet Take 1,000 mg by mouth daily.   Yes [provider]  amLODipine (NORVASC) 10 MG tablet Take 1 tablet (10 mg total) by mouth at bedtime. Patient not taking: Reported on 04/11/2017 02/05/17   Jolaine Artist, MD    Physical Exam: Vitals:   04/11/17 1830 04/11/17 1845 04/11/17 1900 04/11/17 1915  BP: (!) 185/71 (!) 188/71 (!) 184/69 (!) 179/68  Pulse: (!) 55 (!) 55 (!) 59 (!) 58  Resp: 16 15 17  (!) 22  Temp:      TempSrc:      SpO2: 97% 99% 99% 97%      Constitutional: Lethargic, uncomfortable, not in acute respiratory distress.  Eyes: PERTLA, lids and conjunctivae normal ENMT: Mucous membranes are moist. Posterior pharynx clear of any exudate or lesions.   Neck: normal,  supple, no masses, no thyromegaly Respiratory: Fine rales diffusely. Normal respiratory effort. No accessory muscle use.  Cardiovascular: S1 & S2 heard, regular rate and rhythm. No extremity edema. No diaphoresis.   Abdomen: No distension, no tenderness, no masses palpated. Bowel sounds normal.  Musculoskeletal: no clubbing / cyanosis. No joint deformity upper and lower extremities.    Skin: no significant rashes, lesions, ulcers. Warm, well-perfused.  Neurologic: CN 2-12 grossly intact. Sensation intact, DTR normal. Strength 5/5 in all 4 limbs.  Psychiatric: Lethargic, easily roused, oriented x 3. Blunted affect.     Labs on Admission: I have personally reviewed following labs and imaging studies  CBC:  Recent Labs Lab 04/11/17 1529  WBC 6.0  NEUTROABS 3.5  HGB 10.8*  HCT 33.0*  MCV 93.0  PLT 161   Basic Metabolic Panel:  Recent Labs Lab 04/11/17 1529  NA 136  K 6.1*  CL 98*  CO2 27  GLUCOSE 98  BUN 33*  CREATININE 5.56*  CALCIUM 8.8*   GFR: CrCl cannot be calculated (Unknown ideal weight.). Liver Function Tests:  Recent Labs Lab 04/11/17 1529  AST 37  ALT 33  ALKPHOS 112  BILITOT 0.7  PROT 7.0  ALBUMIN 3.7    Recent Labs Lab 04/11/17 1529  LIPASE 94*   No results for input(s): AMMONIA in the last 168 hours. Coagulation Profile: No results for input(s): INR, PROTIME in the last 168 hours. Cardiac Enzymes:  Recent Labs Lab 04/11/17 1529  TROPONINI 0.08*   BNP (last 3 results) No results for input(s): PROBNP in the last 8760 hours. HbA1C: No results for input(s): HGBA1C in the last 72 hours. CBG: No results for input(s): GLUCAP in the last 168 hours. Lipid Profile: No results for input(s): CHOL, HDL, LDLCALC, TRIG, CHOLHDL, LDLDIRECT in the last 72 hours. Thyroid Function Tests: No results for input(s): TSH, T4TOTAL, FREET4, T3FREE, THYROIDAB in the last 72 hours. Anemia Panel: No results for input(s): VITAMINB12, FOLATE, FERRITIN, TIBC,  IRON, RETICCTPCT in the last 72 hours. Urine analysis:    Component Value Date/Time   COLORURINE YELLOW 10/30/2016 [place] 10/30/2016 2339   LABSPEC 1.010 10/30/2016 2339   PHURINE 5.0 10/30/2016 2339   GLUCOSEU NEGATIVE 10/30/2016 2339   HGBUR NEGATIVE 10/30/2016 2339   BILIRUBINUR NEGATIVE 10/30/2016 2339   KETONESUR NEGATIVE 10/30/2016 2339   PROTEINUR 100 (A) 10/30/2016 2339   NITRITE NEGATIVE 10/30/2016 2339   LEUKOCYTESUR NEGATIVE 10/30/2016 2339   Sepsis Labs: @LABRCNTIP (procalcitonin:4,lacticidven:4) )No results found for this or any previous visit (from the past 240 hour(s)).   Radiological Exams on Admission: Dg Chest 2 View  Result Date: 04/11/2017 CLINICAL DATA:  Patient with chest  pain. EXAM: CHEST  2 VIEW COMPARISON:  Chest radiograph 01/18/2017. FINDINGS: Monitoring leads overlie the patient. Stable cardiomegaly status post median sternotomy. Bilateral perihilar interstitial opacities. Small bilateral pleural effusions. No pneumothorax. IMPRESSION: Mild bilateral interstitial pulmonary opacities raising the possibility of mild edema. Small bilateral pleural effusions. Electronically Signed   By: Lovey Newcomer M.D.   On: 04/11/2017 16:55    EKG: Independently reviewed. Sinus rhythm, PVC, LVH with repolarization abnormality.   Assessment/Plan  1. Chest pain, CAD, elevated troponin - Pt presents with a couple hours of chest pain, associated with mild nausea, SOB - He has known CAD and underwent CABG in Sept '17  - Initial troponin is elevated to 0.08; EKG appears similar to prior, CXR with suspected mild edema  - He was started on NTG infusion and given Lasix 40 mg IV in setting of hypertensive urgency and suspected volume overload  - He was treated with ASA 324 mg  - Cardiology is consulting and much appreciated, recommends medical admission, serial troponin measurements  - Continue cardiac monitoring, trend troponin, titrate nitroglycerin infusion,  continue ARB and beta-blocker  2. ESRD, hyperkalemia  - Pt typically receives dialysis on MWF, reports completing his session on 04/09/17 without incident  - Potassium is 6.1 on admission and there is suspected mild pulmonary edema on CXR  - Temporizing measures given for the hyperkalemia; not hypoxic and not in acute respiratory distress  - Nephrology is consulting and much appreciated, planning for HD on 7/30, but notes that if patient unexpectedly develops acute respiratory distress overnight, he should be placed on BiPAP and nephrology called for emergent HD  - Continue cardiac monitoring, follow serial potassium levels until normalized   3. Volume overload  - Secondary to ESRD, CHF  - Pt still urinates, takes Lasix 40 mg BID at home - He was given Lasix 40 mg IV in ED and another 40 mg IV on admission - No hypoxia or acute respiratory distress, but has hypertension urgency addressed below  - SLIV, fluid-restrict diet, appreciate nephrology involvement- anticipate HD in am   4. Insulin-dependent DM  - A1c was 7.1% in May 2018  - Managed at home with Novolog pump and Toujeo 4 units qD  - Hold pump while in hospital, check CBG's q4h until more appropriate for a diet  - Start a low-intensity Novolog sliding-scale and adjust prn    6. Hypertension, hypertensive urgency - Pt presents with BP in 200/80 range  - Managed at home with losartan, HCTZ, Toprol, hydralazine, and Lasix - Started on nitroglycerin infusion in ED with improvement, continue to titrate for SBP <165 - Continue home medications as tolerated  - Nephrology is consulting and much appreciated, planning for HD on 7/30, earlier if needed   7. Anemia of CKD  - Hgb is 10.8 on admission, down from 11.9 in June '18   - No bleeding evident, small drop likely from vol o/l  - Trend H&H   DVT prophylaxis: sq heparin  Code Status: Full  Family Communication: Family updated at bedside Disposition Plan: Admit to SDU Consults  called: Cardiology, nephrology Admission status: Inpatient    Vianne Bulls, MD [facility] Pager [phone]  If 7PM-7AM, please contact night-coverage [email or web address] Password [facility]  04/11/2017, 7:51 PM

## 2017-04-11 NOTE — ED Notes (Signed)
Pt became lethargic and diaphoretic, paged dr Myna Hidalgo

## 2017-04-11 NOTE — Consult Note (Signed)
Renal Service Consult Note Kenneth [name]  Kenneth [name] 04/11/2017 [place] Requesting Physician:  Dr Myna Hidalgo  Reason for Consult:  ESRD pt with chest pain HPI: The patient is a 61 y.o. year-old with hx of DMtype 1, CAD hx CABG/ MI, ESRD on HD MWF presented to ED today w/ chest pain that began today.  No radiation.  CXR showed mild pulm edema, not hypoxic.  K 6.1.  Last HD Friday.  Asked to see for ESRD.    Pt had 3V CABG in Sept 2017.  In Feb 2018 had heart cath which showed patent LIMA> LAD and patent SVG to OM, but the SVG > RCA was 100% occluded.  No intervention was done, medical Rx.  ECHO showed normal LVEF 60%, improved from Sept 2017 LVEF of 35%.       Was here then in April 2018 w/ HTN'sive urgency and trop 0.03.  EKG showed usual LVH w/ repol changes.  CP resolved with improvement in BP and IV heparin.  Suspected demand ischemia.    Pt is tired and not giving much detail in his answers. [facility]ike helps to answer.  They deny any recent fevers, chills, prod cough, abd pain, N/V/D.  Has not missed HD.    ROS  denies CP  no joint pain   no HA  no blurry vision  no rash  no diarrhea  no nausea/ vomiting   Past Medical History  Past Medical History:  Diagnosis Date  . Acute pulmonary edema ([place]) 05/31/2016  . Acute respiratory failure with hypoxemia ([place]) 05/31/2016  . CHF (congestive heart failure) ([place])   . 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[place]) 02/06/2015  . ESRD (end stage renal disease) on dialysis [facility])    "MWF; [facility]" (01/18/2017)  . HCAP (healthcare-associated pneumonia)   . Ischemic rest pain of lower extremity ([place]) 02/06/2015  . 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 NSTEMI (non-ST elevated myocardial infarction) ([place])   . Pain in lower limb 02/06/2015  . Pain in the chest   . Pleural effusion   . 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 Shortness of breath    Past Surgical History  Past Surgical History:  Procedure Laterality  Date  . AV FISTULA PLACEMENT Left 06/22/2016   Procedure: ARTERIOVENOUS (AV) FISTULA CREATION LEFT UPPER ARM;  Surgeon: Rosetta Posner, MD;  Location: [place];  Service: Vascular;  Laterality: Left;  . CARDIAC CATHETERIZATION N/A 05/31/2016   Procedure: Left Heart Cath and Coronary Angiography;  Surgeon: Jettie Booze, MD;  Location: [place] [facility] LAB;  Service: Cardiovascular;  Laterality: N/A;  . CARDIAC CATHETERIZATION N/A 05/31/2016   Procedure: Right Heart Cath;  Surgeon: Jettie Booze, MD;  Location: [place] CV LAB;  Service: Cardiovascular;  Laterality: N/A;  . CARDIAC CATHETERIZATION N/A 05/31/2016   Procedure: IABP Insertion;  Surgeon: Jettie Booze, MD;  Location: [place] CV LAB;  Service: Cardiovascular;  Laterality: N/A;  . CORONARY ARTERY BYPASS GRAFT N/A 06/05/2016   Procedure: CORONARY ARTERY BYPASS GRAFTING (CABG) x3 LIMA to LAD -SVG to OM -SVG to RCA;  Surgeon: Ivin Poot, MD;  Location: [place];  Service: Open Heart Surgery;  Laterality: N/A;  . INSERTION OF DIALYSIS CATHETER N/A 06/05/2016   Procedure: INSERTION OF DIALYSIS/trialysis CATHETER;  Surgeon: Ivin Poot, MD;  Location: [place];  Service: Vascular;  Laterality: N/A;  . INSERTION OF DIALYSIS CATHETER Right 06/13/2016   Procedure: INSERTION OF DIALYSIS CATHETER RIGHT INTERNAL  JUGULAR;  Surgeon: Conrad Stuttgart, MD;  Location: [place];  Service: Vascular;  Laterality: Right;  . INTRAOPERATIVE TRANSESOPHAGEAL ECHOCARDIOGRAM N/A 06/05/2016   Procedure: INTRAOPERATIVE TRANSESOPHAGEAL ECHOCARDIOGRAM;  Surgeon: Ivin Poot, MD;  Location: [place];  Service: Open Heart Surgery;  Laterality: N/A;  . LEFT HEART CATH AND CORS/GRAFTS ANGIOGRAPHY N/A 11/03/2016   Procedure: Left Heart Cath and Cors/Grafts Angiography;  Surgeon: Troy Sine, MD;  Location: [place] CV LAB;  Service: Cardiovascular;  Laterality: N/A;   Family History  Family History  Problem Relation Age of Onset  . CAD Father   . Colon  cancer Father   . Diabetes Brother    Social History  reports that he has quit smoking. He has never used smokeless tobacco. He reports that he does not drink alcohol or use drugs. Allergies No Known Allergies Home medications Prior to Admission medications   Medication Sig Start Date End Date Taking? Authorizing Provider  aspirin 81 MG chewable tablet Chew 1 tablet (81 mg total) by mouth daily. 11/06/16  Yes Caren Griffins, MD  atorvastatin (LIPITOR) 80 MG tablet TAKE ONE TABLET BY MOUTH ONCE DAILY 03/31/17  Yes Larey Dresser, MD  citalopram (CELEXA) 20 MG tablet Take 20 mg by mouth daily. 02/25/17  Yes [provider]  clopidogrel (PLAVIX) 75 MG tablet Take 1 tablet (75 mg total) by mouth daily. Patient taking differently: Take 75 mg by mouth daily with lunch.  01/12/17  Yes Bensimhon, Shaune Pascal, MD  ezetimibe (ZETIA) 10 MG tablet Take 1 tablet (10 mg total) by mouth daily. 02/02/17  Yes Larey Dresser, MD  furosemide (LASIX) 80 MG tablet Take 80 mg by mouth 2 (two) times daily.   Yes [provider]  gabapentin (NEURONTIN) 300 MG capsule Take 300 mg by mouth at bedtime. 02/11/17  Yes [provider]  hydrALAZINE (APRESOLINE) 50 MG tablet Take 1 tablet (50 mg total) by mouth 2 (two) times daily. 12/17/16  Yes Hongalgi, Lenis Dickinson, MD  Insulin Human (INSULIN PUMP) SOLN Inject into the skin continuous. NOVOLOG   Yes [provider]  losartan-hydrochlorothiazide (HYZAAR) 50-12.5 MG tablet Take 1 tablet by mouth daily. 02/11/17  Yes [provider]  metoprolol succinate (TOPROL-XL) 25 MG 24 hr tablet Take 0.5 tablets (12.5 mg total) by mouth at bedtime. 02/09/17  Yes Bensimhon, Shaune Pascal, MD  pantoprazole (PROTONIX) 40 MG tablet Take 40 mg by mouth daily with lunch. 01/07/17  Yes [provider]  TOUJEO SOLOSTAR 300 UNIT/ML SOPN 4 Units.  03/26/17  Yes [provider]  vitamin C (ASCORBIC ACID) 500 MG tablet Take 1,000 mg by mouth daily.   Yes  [provider]  amLODipine (NORVASC) 10 MG tablet Take 1 tablet (10 mg total) by mouth at bedtime. Patient not taking: Reported on 04/11/2017 02/05/17   BensimhonShaune Pascal, MD   Liver Function Tests  Recent Labs Lab 04/11/17 1529  AST 37  ALT 33  ALKPHOS 112  BILITOT 0.7  PROT 7.0  ALBUMIN 3.7    Recent Labs Lab 04/11/17 1529  LIPASE 94*   CBC  Recent Labs Lab 04/11/17 1529  WBC 6.0  NEUTROABS 3.5  HGB 10.8*  HCT 33.0*  MCV 93.0  PLT 992   Basic Metabolic Panel  Recent Labs Lab 04/11/17 1529  NA 136  K 6.1*  CL 98*  CO2 27  GLUCOSE 98  BUN 33*  CREATININE 5.56*  CALCIUM 8.8*   Iron/TIBC/Ferritin/ %Sat  Component Value Date/Time   IRON 30 (L) 10/31/2016 0846   TIBC 246 (L) 10/31/2016 0846   FERRITIN 491 (H) 10/31/2016 0846   IRONPCTSAT 12 (L) 10/31/2016 0846    Vitals:   04/11/17 1900 04/11/17 1915 04/11/17 1945 04/11/17 2015  BP: (!) 184/69 (!) 179/68 (!) 175/71 (!) 187/71  Pulse: (!) 59 (!) 58 (!) 58 61  Resp: 17 (!) 22 13 16   Temp:      TempSrc:      SpO2: 99% 97% 98% 98%   Exam Gen chronically ill appearing pleasant Asian male; breathing heavy but not in distress No rash, cyanosis or gangrene Sclera anicteric, throat clear No jvd or bruits, neck veins flat Chest mostly clear, occ basilar crackles bilat RRR question soft sem , no RG; sternotomy scar well healed Abd soft ntnd no mass or ascites +bs no hsm, insulin pump LLQ GU normal male defer MS no joint effusions or deformity Ext no LE or UE edema / no wounds or ulcers Neuro is alert, Ox 3 , nf LUA  AVF + bruit    Dialysis: MWF [facility]  (from May 2018) > 4h   60kg  Hep 4500  LUA AVF    Impression: 1  Chest pain - trop min ^, EKG has repol changes which are chronic.  Mild pulm edema but not hypoxic on RA. Plan is to get BP down as you're doing w/ IV NTG, IV and po BP meds.  Plan on HD 1st shift in am.  If becomes actively dyspneic we can do HD tonight.   2  HTN  crisis 3  Pulm edema - mild/ mod 4  ESRD on HD 5  CAD hx CABG 2017 6  IDDM type 1 on insulin pump 7  Hyperkalemia - mild, sp 15 gm kayexalate    Plan - as above.   Kelly Splinter MD [employer] pager [phone]   04/11/2017, 8:44 PM             MWF at [facility] [place] 4 hrs, BFR 3108m DFR 800, EDW 60kg, 2K/2.5Ca bath, AVF - Heparin 4500 units q HD - No Mircera, VDRA, Sensipar, Venofer.  Assessment/Plan: 1.  R foot cellulitis: On Vanc/Zosyn. Per primary. Consider PAD eval while inpatient. 2.  ESRD: Continue MWF schedule, next 5/9. 3.  Hypertension/volume: BP borderline high, continue current meds/ EDW for now. 4.  Anemia: Hgb 14.7. No need for ESA at this time. 5.  Metabolic bone disease: Ca ok, Phos pending. Looks like not on binders/VDRA. Monitor. 6.  Nutrition:  Alb 3.7, adding Nepro supps. 7. Type 2 DM (with insulin pump): Per primary. 8. CAD (Hx CABG): No active complaints.On plavix. Per primary.  Veneta Penton, PA-C 01/19/2017, 9:19 AM  [name] Pager: [phone]

## 2017-04-11 NOTE — ED Notes (Signed)
Dr Dayna Barker advised to disregard directions on nitro drip and to aim for systolic bp of 546-503 for patient.

## 2017-04-11 NOTE — ED Notes (Signed)
Linna Hoff (son)- [phone]

## 2017-04-11 NOTE — ED Triage Notes (Signed)
Pt arrives via [facility] for c/o central chest pain that began today. Pt denies radiation of pain. Pt reports he had the same type of chest pain 3 months ago and was not given a definitive diagnosis for it. [facility]ms reports pt is a/o. Pt does dialysis mwf.

## 2017-04-11 NOTE — ED Notes (Signed)
This RN contacted lab regarding patients troponin, lipase, CMP and BNP result, Kenneth [name] in lab advised that the CBC order was placed prior to the other orders and they had not been processed yet, Kenneth [name] stated she would process the rest of the patient's lab orders at this time

## 2017-04-12 DIAGNOSIS — R748 Abnormal levels of other serum enzymes: Secondary | ICD-10-CM

## 2017-04-12 DIAGNOSIS — I2510 Atherosclerotic heart disease of native coronary artery without angina pectoris: Secondary | ICD-10-CM

## 2017-04-12 DIAGNOSIS — I509 Heart failure, unspecified: Secondary | ICD-10-CM

## 2017-04-12 DIAGNOSIS — D631 Anemia in chronic kidney disease: Secondary | ICD-10-CM

## 2017-04-12 DIAGNOSIS — N185 Chronic kidney disease, stage 5: Secondary | ICD-10-CM

## 2017-04-12 DIAGNOSIS — R079 Chest pain, unspecified: Secondary | ICD-10-CM

## 2017-04-12 LAB — BASIC METABOLIC PANEL
Anion gap: 11 (ref 5–15)
BUN: 38 mg/dL — AB (ref 6–20)
CHLORIDE: 98 mmol/L — AB (ref 101–111)
CO2: 27 mmol/L (ref 22–32)
CREATININE: 6.06 mg/dL — AB (ref 0.61–1.24)
Calcium: 8.8 mg/dL — ABNORMAL LOW (ref 8.9–10.3)
GFR calc Af Amer: 11 mL/min — ABNORMAL LOW (ref >60)
GFR calc non Af Amer: 9 mL/min — ABNORMAL LOW (ref >60)
GLUCOSE: 68 mg/dL (ref 65–99)
POTASSIUM: 5 mmol/L (ref 3.5–5.1)
Sodium: 136 mmol/L (ref 135–145)

## 2017-04-12 LAB — CBC
HEMATOCRIT: 30.1 % — AB (ref 39.0–52.0)
Hemoglobin: 9.7 g/dL — ABNORMAL LOW (ref 13.0–17.0)
MCH: 29.8 pg (ref 26.0–34.0)
MCHC: 32.2 g/dL (ref 30.0–36.0)
MCV: 92.6 fL (ref 78.0–100.0)
Platelets: 167 10*3/uL (ref 150–400)
RBC: 3.25 MIL/uL — ABNORMAL LOW (ref 4.22–5.81)
RDW: 15.4 % (ref 11.5–15.5)
WBC: 6.1 10*3/uL (ref 4.0–10.5)

## 2017-04-12 LAB — POTASSIUM
Potassium: 4 mmol/L (ref 3.5–5.1)
Potassium: 4.1 mmol/L (ref 3.5–5.1)
Potassium: 4.8 mmol/L (ref 3.5–5.1)

## 2017-04-12 LAB — CBG MONITORING, ED
GLUCOSE-CAPILLARY: 93 mg/dL (ref 65–99)
Glucose-Capillary: 102 mg/dL — ABNORMAL HIGH (ref 65–99)

## 2017-04-12 LAB — TROPONIN I
TROPONIN I: 0.08 ng/mL — AB (ref <0.03)
Troponin I: 0.06 ng/mL (ref <0.03)

## 2017-04-12 LAB — MRSA PCR SCREENING: MRSA by PCR: NEGATIVE

## 2017-04-12 LAB — GLUCOSE, CAPILLARY
GLUCOSE-CAPILLARY: 150 mg/dL — AB (ref 65–99)
GLUCOSE-CAPILLARY: 239 mg/dL — AB (ref 65–99)
GLUCOSE-CAPILLARY: 178 mg/dL — AB (ref 65–99)
Glucose-Capillary: 73 mg/dL (ref 65–99)
Glucose-Capillary: 132 mg/dL — ABNORMAL HIGH (ref 65–99)

## 2017-04-12 MED ORDER — HYDROCHLOROTHIAZIDE 12.5 MG PO CAPS: 12.5000 mg | ORAL_CAPSULE | Freq: Every day | ORAL | Status: DC

## 2017-04-12 MED ORDER — ISOSORBIDE MONONITRATE 15 MG HALF TABLET
15.0000 mg | ORAL_TABLET | Freq: Every day | ORAL | Status: DC
  Administered 2017-04-12: 15 mg via ORAL
  Filled 2017-04-12 (×3): qty 1

## 2017-04-12 MED ORDER — INSULIN ASPART 100 UNIT/ML ~~LOC~~ SOLN: 0.0000 [IU] | Freq: Every day | SUBCUTANEOUS | Status: DC

## 2017-04-12 MED ORDER — LIDOCAINE HCL (PF) 1 % IJ SOLN: 5.0000 mL | INTRAMUSCULAR | Status: DC | PRN

## 2017-04-12 MED ORDER — LOSARTAN POTASSIUM 50 MG PO TABS
50.0000 mg | ORAL_TABLET | Freq: Every day | ORAL | Status: DC
  Administered 2017-04-12 – 2017-04-13 (×2): 50 mg via ORAL
  Filled 2017-04-12 (×2): qty 1

## 2017-04-12 MED ORDER — SODIUM CHLORIDE 0.9 % IV SOLN: 100.0000 mL | INTRAVENOUS | Status: DC | PRN

## 2017-04-12 MED ORDER — LIDOCAINE-PRILOCAINE 2.5-2.5 % EX CREA: 1.0000 "application " | TOPICAL_CREAM | CUTANEOUS | Status: DC | PRN

## 2017-04-12 MED ORDER — INSULIN DETEMIR 100 UNIT/ML ~~LOC~~ SOLN
10.0000 [IU] | SUBCUTANEOUS | Status: AC
  Administered 2017-04-12: 10 [IU] via SUBCUTANEOUS
  Filled 2017-04-12: qty 0.1

## 2017-04-12 MED ORDER — PENTAFLUOROPROP-TETRAFLUOROETH EX AERO: 1.0000 "application " | INHALATION_SPRAY | CUTANEOUS | Status: DC | PRN

## 2017-04-12 MED ORDER — RENA-VITE PO TABS
1.0000 | ORAL_TABLET | Freq: Every day | ORAL | Status: DC
  Administered 2017-04-12: 1 via ORAL
  Filled 2017-04-12: qty 1

## 2017-04-12 MED ORDER — INSULIN ASPART 100 UNIT/ML ~~LOC~~ SOLN: 0.0000 [IU] | Freq: Three times a day (TID) | SUBCUTANEOUS | Status: DC

## 2017-04-12 MED ORDER — HEPARIN SODIUM (PORCINE) 1000 UNIT/ML DIALYSIS: 1000.0000 [IU] | INTRAMUSCULAR | Status: DC | PRN

## 2017-04-12 MED ORDER — ALTEPLASE 2 MG IJ SOLR: 2.0000 mg | Freq: Once | INTRAMUSCULAR | Status: DC | PRN

## 2017-04-12 MED ORDER — HEPARIN SODIUM (PORCINE) 1000 UNIT/ML DIALYSIS: 4500.0000 [IU] | Freq: Once | INTRAMUSCULAR | Status: DC

## 2017-04-12 MED ORDER — INSULIN ASPART 100 UNIT/ML ~~LOC~~ SOLN
0.0000 [IU] | Freq: Three times a day (TID) | SUBCUTANEOUS | Status: DC
  Administered 2017-04-12: 3 [IU] via SUBCUTANEOUS
  Administered 2017-04-13 (×2): 2 [IU] via SUBCUTANEOUS

## 2017-04-12 NOTE — Plan of Care (Signed)
Problem: Physical Regulation: Goal: Ability to maintain clinical measurements within normal limits will improve Outcome: Progressing Pt ambulating without difficulty

## 2017-04-12 NOTE — Progress Notes (Addendum)
PROGRESS NOTE   Kenneth [name]  WNU:[id number]    DOB: Feb 28, 1956    DOA: 04/11/2017  PCP: [name], MD   I have briefly reviewed patients previous medical records in [facility].  Brief Narrative:  61 year old male with a PMH of ESRD on MWF HD, CAD status post CABG 05/2016, heart cath 11/03/16 showed occluded SVG-RCA and patent SVG-OM with extensive collateralization supplying the entire RCA up to the point of proximal RCA occlusion >no intervention done, medical treatment, chronic systolic CHF (September 0347 LVEF 35%), DM type I on insulin pump, HTN, HLD, chronic right foot wound presented to ED with gradual onset of left-sided chest pain, moderate to severe in intensity, nonradiating, constant without aggravating or alleviating factors and associated with mild dyspnea and nausea. In the ED, blood pressure 202/77, EKG sinus rhythm with PVC, LVH, chest x-ray suggestive of mild pulmonary edema, troponin 0.08, BNP 1725. Given a dose of aspirin 324 MG, started on NTG infusion, received cocktail for hyperkalemia. Cardiology and nephrology were consulted and he was admitted to stepdown unit.   Assessment & Plan:   Principal Problem:   Chest pain Active Problems:   CHF (congestive heart failure), NYHA class I, chronic, systolic (HCC)   S/P CABG x 3   ESRD (end stage renal disease) (HCC)   CAD (coronary artery disease)   DM (diabetes mellitus), type 2 with renal complications (HCC)   Elevated troponin I level   Gastroesophageal reflux disease   Anemia, chronic renal failure   Volume overload   Diabetic foot ulcer ([place])   1. Chest pain with mild troponin elevation: Cardiac history as documented above. His presentation may have been due to hypertensive urgency. Cardiology consultation appreciated. Currently on aspirin, Plavix, atorvastatin, Zetia, metoprolol. Troponin has flat trend. As per cardiology, he has rarely had a negative troponin in the past, likely related to his RCA. Due to  response of chest pain to nitro drip, they have discontinued hydralazine and starting Imdur. Monitor blood pressure closely. 2. Acute on Chronic systolic CHF: Echo 4/25: EF 35-40 percent and grade 2 diastolic dysfunction but follow-up echo 4/18: LVEF 55-60 percent. Volume management across dialysis. Continue losartan and Imdur now added. Stable. 3. Postop A. fib (CABG): Remains in sinus rhythm. 4. ESRD on MWF HD: Nephrology consultation and follow-up appreciated. Discussed with nephrology. Efforts ongoing lower EDW given presentation with hypertensive urgency and pulmonary edema on chest x-ray. Hyperkalemia resolved. 5. Type I DM with renal disease: Discussed multiple times with diabetes coordinator. Patient apparently does not have his insulin pump supplies in the hospital. As per their recommendation, placed on reduced dose of Levemir 10 units daily and NovoLog SSI. Monitor CBGs closely. Adjust as needed. Return to insulin pump and available or at discharge. A1c 7.1% in May 2018. 6. Hypertensive urgency/Essential hypertension: Was on NTG infusion on admission. Now off. Now Controlled. HCTZ discontinued. Volume management across HD. 7. Anemia: Hemoglobin has dropped from 10.8 > 9.7. No overt bleeding reported. Follow CBCs periodically at HD. 8. R foot wounds: Nurse reported on third and fourth toe, dressed by spouse this morning, no features of infection. WOC consulted. Has not been on antibiotics in the hospital for this.   DVT prophylaxis: Subcutaneous heparin Code Status: Full Family Communication: None at bedside Disposition: DC home when medically improved   Consultants:  Cardiology Nephrology    Procedures:  Hemodialysis  Antimicrobials:  None    Subjective: Seen this morning at hemodialysis. No chest pain or  dyspnea reported. States that he feels "okay". Reports that he sees his PCP in [place].   ROS: Denies  Objective:  Vitals:   04/12/17 1027 04/12/17 1100 04/12/17  1200 04/12/17 1259  BP: 122/63 (!) 112/51 129/61 (!) 138/57  Pulse: 71 70 74 71  Resp: 17 13 18 15   Temp:   98.2 F (36.8 C) 98.5 F (36.9 C)  TempSrc:   Oral Oral  SpO2: 98% 100% 98% 100%  Weight:   63 kg (138 lb 14.2 oz)   Height:        Examination:  General exam: Pleasant middle-aged male lying comfortably supine in bed undergoing HD this morning. Respiratory system: Occasional basal crackles but otherwise clear to auscultation. No increased work of breathing. Cardiovascular system: S1 & S2 heard, RRR. No JVD, murmurs, rubs, gallops or clicks. No pedal edema. Telemetry: Sinus rhythm. Gastrointestinal system: Abdomen is nondistended, soft and nontender. No organomegaly or masses felt. Normal bowel sounds heard. Central nervous system: Alert and oriented. No focal neurological deficits. Extremities: Symmetric 5 x 5 power. Skin: No rashes, lesions or ulcers Psychiatry: Judgement and insight appear normal. Mood & affect appropriate.     Data Reviewed: I have personally reviewed following labs and imaging studies  CBC:  Recent Labs Lab 04/11/17 1529 04/12/17 0219  WBC 6.0 6.1  NEUTROABS 3.5  --   HGB 10.8* 9.7*  HCT 33.0* 30.1*  MCV 93.0 92.6  PLT 151 546   Basic Metabolic Panel:  Recent Labs Lab 04/11/17 1529 04/11/17 2007 04/12/17 0219 04/12/17 0722 04/12/17 1247  NA 136  --  136  --   --   K 6.1* 5.0 5.0 4.0 4.1  CL 98*  --  98*  --   --   CO2 27  --  27  --   --   GLUCOSE 98  --  68  --   --   BUN 33*  --  38*  --   --   CREATININE 5.56*  --  6.06*  --   --   CALCIUM 8.8*  --  8.8*  --   --    Liver Function Tests:  Recent Labs Lab 04/11/17 1529  AST 37  ALT 33  ALKPHOS 112  BILITOT 0.7  PROT 7.0  ALBUMIN 3.7   Cardiac Enzymes:  Recent Labs Lab 04/11/17 1529 04/11/17 2007 04/12/17 0219 04/12/17 0722  TROPONINI 0.08* 0.08* 0.08* 0.06*   CBG:  Recent Labs Lab 04/12/17 0031 04/12/17 0105 04/12/17 0212 04/12/17 0839 04/12/17 1256   GLUCAP 102* 93 73 150* 239*    Recent Results (from the past 240 hour(s))  MRSA PCR Screening     Status: None   Collection Time: 04/12/17  2:42 AM  Result Value Ref Range Status   MRSA by PCR NEGATIVE NEGATIVE Final    Comment:        The GeneXpert MRSA Assay (FDA approved for NASAL specimens only), is one component of a comprehensive MRSA colonization surveillance program. It is not intended to diagnose MRSA infection nor to guide or monitor treatment for MRSA infections.          Radiology Studies: Dg Chest 2 View  Result Date: 04/11/2017 CLINICAL DATA:  Patient with chest pain. EXAM: CHEST  2 VIEW COMPARISON:  Chest radiograph 01/18/2017. FINDINGS: Monitoring leads overlie the patient. Stable cardiomegaly status post median sternotomy. Bilateral perihilar interstitial opacities. Small bilateral pleural effusions. No pneumothorax. IMPRESSION: Mild bilateral interstitial pulmonary opacities raising the possibility of  mild edema. Small bilateral pleural effusions. Electronically Signed   By: Lovey Newcomer M.D.   On: 04/11/2017 16:55        Scheduled Meds: . aspirin  81 mg Oral Daily  . atorvastatin  80 mg Oral q1800  . citalopram  20 mg Oral Daily  . clopidogrel  75 mg Oral Q lunch  . ezetimibe  10 mg Oral Daily  . gabapentin  300 mg Oral QHS  . heparin  5,000 Units Subcutaneous Q8H  . insulin aspart  0-5 Units Subcutaneous QHS  . insulin aspart  0-9 Units Subcutaneous TID WC  . isosorbide mononitrate  15 mg Oral Daily  . losartan  50 mg Oral Daily  . metoprolol succinate  12.5 mg Oral QHS  . multivitamin  1 tablet Oral QHS  . pantoprazole  40 mg Oral Q lunch  . sodium chloride flush  3 mL Intravenous Q12H  . vitamin C  1,000 mg Oral Daily   Continuous Infusions: . sodium chloride       LOS: 1 day     HONGALGI,ANAND, MD, FACP, FHM. [facility] Pager [phone]  If 7PM-7AM, please contact night-coverage [email or web address] Password  [email or web address] 04/12/2017, 3:53 PM

## 2017-04-12 NOTE — Consult Note (Signed)
Cardiology Consult    Patient ID: Kenneth [name] MRN: [id number], DOB/AGE: May 01, 1956   Admit date: 04/11/2017 Date of Consult: 04/12/2017  Primary Physician: [name], MD Primary Cardiologist: Dr. Aundra Dubin Requesting Provider: Dr. Algis Liming  Reason for Consult: chest pain  Patient Profile    Kenneth [name] has a PMH significant for ESRD on HD, CAD s/p CABG  x 3 (LIMA to LAD, SVG to OM, SVG to RCA) 06/03/16 and s/p heart cath (11/03/16) with occluded SVG-RCA and patent SVG-OM with extensive collateralization supplying the entire RCA up to the point of proximal RCA occlusion, HTN, HLD, and DM. He presented to [facility] with chest pain.  Kenneth [name] is a 61 y.o. male who is being seen today for the evaluation of chest pain at the request of Dr. Algis Liming.   Past Medical History   Past Medical History:  Diagnosis Date  . Acute pulmonary edema ([place]) 05/31/2016  . Acute respiratory failure with hypoxemia ([place]) 05/31/2016  . CHF (congestive heart failure) ([place])   . 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 ESRD (end stage renal disease) on dialysis [facility])    "MWF; [place]" (01/18/2017)  . HCAP (healthcare-associated pneumonia)   . Ischemic rest pain of lower extremity ([place]) 02/06/2015  . 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 NSTEMI (non-ST elevated myocardial infarction) ([place])   . Pain in lower limb 02/06/2015  . Pain in the chest   . Pleural effusion   . 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 Shortness of breath     Past Surgical History:  Procedure Laterality Date  . AV FISTULA PLACEMENT Left 06/22/2016   Procedure: ARTERIOVENOUS (AV) FISTULA CREATION LEFT UPPER ARM;  Surgeon: Rosetta Posner, MD;  Location: [place];  Service: Vascular;  Laterality: Left;  . CARDIAC CATHETERIZATION N/A 05/31/2016   Procedure: Left Heart Cath and Coronary Angiography;  Surgeon: Jettie Booze, MD;  Location: [place] CV LAB;  Service: Cardiovascular;  Laterality: N/A;  .  CARDIAC CATHETERIZATION N/A 05/31/2016   Procedure: Right Heart Cath;  Surgeon: Jettie Booze, MD;  Location: [place] CV LAB;  Service: Cardiovascular;  Laterality: N/A;  . CARDIAC CATHETERIZATION N/A 05/31/2016   Procedure: IABP Insertion;  Surgeon: Jettie Booze, MD;  Location: [place] CV LAB;  Service: Cardiovascular;  Laterality: N/A;  . CORONARY ARTERY BYPASS GRAFT N/A 06/05/2016   Procedure: CORONARY ARTERY BYPASS GRAFTING (CABG) x3 LIMA to LAD -SVG to OM -SVG to RCA;  Surgeon: Ivin Poot, MD;  Location: [place];  Service: Open Heart Surgery;  Laterality: N/A;  . INSERTION OF DIALYSIS CATHETER N/A 06/05/2016   Procedure: INSERTION OF DIALYSIS/trialysis CATHETER;  Surgeon: Ivin Poot, MD;  Location: [place];  Service: Vascular;  Laterality: N/A;  . INSERTION OF DIALYSIS CATHETER Right 06/13/2016   Procedure: INSERTION OF DIALYSIS CATHETER RIGHT INTERNAL JUGULAR;  Surgeon: Conrad Fedora, MD;  Location: [place];  Service: Vascular;  Laterality: Right;  . INTRAOPERATIVE TRANSESOPHAGEAL ECHOCARDIOGRAM N/A 06/05/2016   Procedure: INTRAOPERATIVE TRANSESOPHAGEAL ECHOCARDIOGRAM;  Surgeon: Ivin Poot, MD;  Location: [place];  Service: Open Heart Surgery;  Laterality: N/A;  . LEFT HEART CATH AND CORS/GRAFTS ANGIOGRAPHY N/A 11/03/2016   Procedure: Left Heart Cath and Cors/Grafts Angiography;  Surgeon: Troy Sine, MD;  Location: [place] CV LAB;  Service: Cardiovascular;  Laterality: N/A;     Allergies  No Known Allergies  History of Present Illness    Kenneth [name]  underwent CABG x 3 (LIMA to LAD, SVG to OM, SVG to RCA) 06/03/16 and s/p heart cath (11/03/16) with occluded SVG-RCA and patent SVG-OM with extensive collateralization supplying the entire RCA up to the point of proximal RCA occlusion. Medical therapy was recommended at that time.   He was seen in follow up 12/24/16 and at that time was in his usual state of health and back on HD.   He reported to [facility] on 04/11/17 with  complaints of chest pain and shortness of breath that started yesterday. He states the pain radiated down both arms, chest, back, and legs. He stated he "hurt everywhere." This was associated with SOB, but no palpitations, nausea, vomiting, or feelings of syncope. He also stated that he had a fever. Upon arrival, he was started on a nitro drip. He has been afebrile this admission and without leukocytosis. He states his chest pain has been relieved.  Inpatient Medications    . aspirin  81 mg Oral Daily  . atorvastatin  80 mg Oral q1800  . citalopram  20 mg Oral Daily  . clopidogrel  75 mg Oral Q lunch  . ezetimibe  10 mg Oral Daily  . gabapentin  300 mg Oral QHS  . [START ON 04/13/2017] heparin  4,500 Units Dialysis Once in dialysis  . heparin  5,000 Units Subcutaneous Q8H  . hydrALAZINE  50 mg Oral BID  . insulin aspart  0-9 Units Subcutaneous Q4H  . losartan  50 mg Oral Daily  . metoprolol succinate  12.5 mg Oral QHS  . multivitamin  1 tablet Oral QHS  . pantoprazole  40 mg Oral Q lunch  . sodium chloride flush  3 mL Intravenous Q12H  . vitamin C  1,000 mg Oral Daily     Outpatient Medications    Prior to Admission medications   Medication Sig Start Date End Date Taking? Authorizing Provider  aspirin 81 MG chewable tablet Chew 1 tablet (81 mg total) by mouth daily. 11/06/16  Yes Caren Griffins, MD  atorvastatin (LIPITOR) 80 MG tablet TAKE ONE TABLET BY MOUTH ONCE DAILY 03/31/17  Yes Larey Dresser, MD  citalopram (CELEXA) 20 MG tablet Take 20 mg by mouth daily. 02/25/17  Yes [provider]  clopidogrel (PLAVIX) 75 MG tablet Take 1 tablet (75 mg total) by mouth daily. Patient taking differently: Take 75 mg by mouth daily with lunch.  01/12/17  Yes Bensimhon, Shaune Pascal, MD  ezetimibe (ZETIA) 10 MG tablet Take 1 tablet (10 mg total) by mouth daily. 02/02/17  Yes Larey Dresser, MD  furosemide (LASIX) 80 MG tablet Take 80 mg by mouth 2 (two) times daily.   Yes [provider]  gabapentin (NEURONTIN) 300 MG capsule Take 300 mg by mouth at bedtime. 02/11/17  Yes [provider]  hydrALAZINE (APRESOLINE) 50 MG tablet Take 1 tablet (50 mg total) by mouth 2 (two) times daily. 12/17/16  Yes Hongalgi, Lenis Dickinson, MD  Insulin Human (INSULIN PUMP) SOLN Inject into the skin continuous. NOVOLOG   Yes [provider]  losartan-hydrochlorothiazide (HYZAAR) 50-12.5 MG tablet Take 1 tablet by mouth daily. 02/11/17  Yes [provider]  metoprolol succinate (TOPROL-XL) 25 MG 24 hr tablet Take 0.5 tablets (12.5 mg total) by mouth at bedtime. 02/09/17  Yes Bensimhon, Shaune Pascal, MD  pantoprazole (PROTONIX) 40 MG tablet Take 40 mg by mouth daily with lunch. 01/07/17  Yes [provider]  TOUJEO SOLOSTAR 300 UNIT/ML SOPN 4 Units.  03/26/17  Yes [provider]  vitamin C (ASCORBIC ACID) 500 MG tablet Take 1,000 mg by mouth daily.   Yes [provider]  amLODipine (NORVASC) 10 MG tablet Take 1 tablet (10 mg total) by mouth at bedtime. Patient not taking: Reported on 04/11/2017 02/05/17   Bensimhon, Shaune Pascal, MD     Family History     Family History  Problem Relation Age of Onset  . CAD Father   . Colon cancer Father   . Diabetes Brother     Social History    Social History   Social History  . Marital status: Married    Spouse name: N/A  . Number of children: N/A  . Years of education: N/A   Occupational History  . Not on file.   Social History Main Topics  . Smoking status: Former Research scientist (life sciences)  . Smokeless tobacco: Never Used  . Alcohol use No  . Drug use: No  . Sexual activity: Not on file   Other Topics Concern  . Not on file   Social History Narrative  . No narrative on file     Review of Systems    General:  No chills, + fever, no night sweats or weight changes.  Cardiovascular:  + chest pain, no dyspnea on exertion, edema, orthopnea, palpitations, paroxysmal nocturnal dyspnea. Dermatological: No rash,  lesions/masses Respiratory: No cough, dyspnea Urologic: No hematuria, dysuria Abdominal:   No nausea, vomiting, diarrhea, bright red blood per rectum, melena, or hematemesis Neurologic:  No visual changes, changes in mental status. All other systems reviewed and are otherwise negative except as noted above.  Physical Exam    Blood pressure (!) 159/69, pulse 65, temperature 98 F (36.7 C), temperature source Oral, resp. rate 15, height 5\' 6"  (1.676 m), weight 145 lb 8.1 oz (66 kg), SpO2 98 %.  General: Pleasant, NAD Psych: Normal affect. Neuro: Alert and oriented X 3. Moves all extremities spontaneously. HEENT: Normal  Neck: Supple without bruits or JVD. Lungs:  Resp regular and unlabored, CTA. Heart: RRR no s3, s4, or murmurs. Abdomen: Soft, non-tender, non-distended, BS + x 4.  Extremities: No clubbing, cyanosis or edema. DP/PT/Radials 2+ and equal bilaterally.  Labs    Troponin (Point of Care Test) No results for input(s): TROPIPOC in the last 72 hours.  Recent Labs  04/11/17 1529 04/11/17 2007 04/12/17 0219 04/12/17 0722  TROPONINI 0.08* 0.08* 0.08* 0.06*   Lab Results  Component Value Date   WBC 6.1 04/12/2017   HGB 9.7 (L) 04/12/2017   HCT 30.1 (L) 04/12/2017   MCV 92.6 04/12/2017   PLT 167 04/12/2017    Recent Labs Lab 04/11/17 1529  04/12/17 0219 04/12/17 0722  NA 136  --  136  --   K 6.1*  < > 5.0 4.0  CL 98*  --  98*  --   CO2 27  --  27  --   BUN 33*  --  38*  --   CREATININE 5.56*  --  6.06*  --   CALCIUM 8.8*  --  8.8*  --   PROT 7.0  --   --   --   BILITOT 0.7  --   --   --   ALKPHOS 112  --   --   --   ALT 33  --   --   --   AST 37  --   --   --   GLUCOSE 98  --  68  --   < > =  values in this interval not displayed. Lab Results  Component Value Date   CHOL 70 10/13/2016   HDL 26 (L) 10/13/2016   LDLCALC 33 10/13/2016   TRIG 54 10/13/2016   Lab Results  Component Value Date   DDIMER 0.63 (H) 12/15/2016     Radiology Studies    Dg  Chest 2 View  Result Date: 04/11/2017 CLINICAL DATA:  Patient with chest pain. EXAM: CHEST  2 VIEW COMPARISON:  Chest radiograph 01/18/2017. FINDINGS: Monitoring leads overlie the patient. Stable cardiomegaly status post median sternotomy. Bilateral perihilar interstitial opacities. Small bilateral pleural effusions. No pneumothorax. IMPRESSION: Mild bilateral interstitial pulmonary opacities raising the possibility of mild edema. Small bilateral pleural effusions. Electronically Signed   By: Lovey Newcomer M.D.   On: 04/11/2017 16:55    ECG & Cardiac Imaging    EKG 04/12/17: sinus with ST changes unchanged from previous  Left heart cath 11/03/16  Mid LAD to Dist LAD lesion, 90 %stenosed.  Ost 2nd Diag to 2nd Diag lesion, 95 %stenosed.  [place] lesion, 80 %stenosed.  Prox Cx lesion, 90 %stenosed.  Ost 1st Mrg to 1st Mrg lesion, 100 %stenosed.  Prox RCA lesion, 95 %stenosed.  Mid RCA lesion, 100 %stenosed.  SVG and is normal in caliber and anatomically normal.  LIMA and is normal in caliber and anatomically normal.  Dist LAD lesion, 20 %stenosed.  Graft was visualized by angiography and is normal in caliber and anatomically normal.  Origin to Prox Graft lesion, 100 %stenosed.  Ost LM to LM lesion, 40 %stenosed.   Normal LV function with an ejection fraction of 50% with mild mid anterolateral and mid inferior hypocontractility.  Significant native CAD with 30% smooth distal left main stenosis; diffuse 90% proximal to mid LAD stenoses with 95% diffuse stenosis in the diagonal vessel, which was not bypassed; diffuse 80% stenosis in a small intermediate vessel; 90% proximal left circumflex stenosis, without visualization antegrade of the OM1 vessel; and total proximal RCA occlusion.  Patent LIMA graft supplying the mid LAD with some views suggesting a mild 20% eccentric narrowing just beyond the anastomosis.  There is collateralization from the distal LAD to the distal RCA and faint  collateralization to the distal diagonal vessel.  Patent SVG supplying the large OM branch with extensive collateralization supplying the entire RCA up to the point of proximal RCA occlusion.  Occluded SVG which had supplied the RCA.  RECOMMENDATION: Will initially attempt continued medical therapy with optimization of medical management.  Diagnostic Diagram        Echocardiogram 12/16/16: Study Conclusions - Left ventricle: The cavity size was normal. There was moderate   concentric hypertrophy. Systolic function was normal. The   estimated ejection fraction was in the range of 55% to 60%. Wall   motion was normal; there were no regional wall motion   abnormalities. Doppler parameters are consistent with abnormal   left ventricular relaxation (grade 1 diastolic dysfunction).   Doppler parameters are consistent with elevated ventricular   end-diastolic filling pressure. - Aortic valve: Trileaflet; mildly thickened, mildly calcified   leaflets. There was no regurgitation. - Aortic root: The aortic root was normal in size. - Mitral valve: There was mild regurgitation. - Left atrium: The atrium was mildly dilated. - Right ventricle: The cavity size was normal. Wall thickness was   normal. Systolic function was normal. - Right atrium: The atrium was normal in size. - Tricuspid valve: There was no regurgitation. - Inferior vena cava: The vessel was normal in  size. - Pericardium, extracardiac: There was no pericardial effusion.  Impressions: - No significant difference since the last study on 09/25/2016.   Echocardiogram 05/31/16: Study Conclusions - Left ventricle: The cavity size was normal. Systolic function was   moderately reduced. The estimated ejection fraction was in the   range of 35% to 40%. Features are consistent with a pseudonormal   left ventricular filling pattern, with concomitant abnormal   relaxation and increased filling pressure (grade 2 diastolic    dysfunction). Doppler parameters are consistent with elevated   ventricular end-diastolic filling pressure. - Aortic valve: Trileaflet; normal thickness leaflets. - Aortic root: The aortic root was normal in size. - Right ventricle: Systolic function was normal. - Right atrium: The atrium was normal in size. - Pulmonary arteries: Systolic pressure was within the normal   range. - Pericardium, extracardiac: There was no pericardial effusion.   There was a large left pleural effusion.  Impressions: - There is hypokinesis of the entire anterior, basal anterolateral,   mid anteroseptal and apical septal walls. Overall LVEF 35-40%.   Filling pressures are elevated.   There is a large left pleural effusion.   Assessment & Plan    1. Chest pain, CAD s/p CABG (06/05/16) and s/p heart cath (11/03/16) with occluded SVG-RCA and patent SVG-OM with extensive collateralization supplying the entire RCA up to the point of proximal RCA occlusion.  - per previous cards notes, medical management as BP allows - home meds include: ASA, plavix, lipitor, hydralazine, losartan-HCTZ, toprol, norvasc, and lasix - pt continues to have chest pain - troponin 0.08 --> 0.08 --> 0.08 --> 0.06 - trend is minimal elevation and flat, consistent with demand ischemia instead of ACS - review of previous troponins and he rarely has a negative value, likely related to his RCA - because nitro drip relieved his chest pain, we will D/C hydralazine and start long acting nitrate imdur - continue to follow BP   2. History of chronic systolic heart failure by echo 05/31/16, recovery of function by echo on 12/16/16, ischemic cardiomyopathy - fluid status per dialysis and nephrology  - Echo (9/17) with EF 35-40%, grade II diastolic dysfunction.   - Echo (1/18) with EF 60-65%, grade II diastolic dysfunction.  - Echo (4/18) with EF 55-60%, moderate LVH - continue on current medications with changes as above  3. Post-op Afib (CABG)  noted - NSR on telemetry  4. ESRD on HD - per nephrology  5. DM - per primary team  Signed, Ledora Bottcher, PA-C 04/12/2017, 10:08 AM [phone]  Patient seen and independently examined with Fabian Sharp, PA. We discussed all aspects of the encounter. I agree with the assessment and plan as stated above.  Patient with known CAD s/p CABG with cath 10/2016 with patent LIMA to LAD, with occluded SVG-RCA and patent SVG-OM with extensive collateralization supplying the entire RCA up to the point of proximal RCA occlusion and left to left collaterals to the diagonal. Medical management was recommended at prior cath.  He is currently not on any antianginal therapy other than BB.  Will stop his hydralazine to allow for more BP during HD to allow addition of nitrates. Will add Imdur 15mg  daily and titrate as needed for CP.  Continue Toprol XL 12.5 mg daily.  If BP drops consider stopping losartan.    Signed: Fransico Him, MD [facility] 04/12/2017

## 2017-04-12 NOTE — Progress Notes (Signed)
   04/12/17 0247  Vitals  Temp 97.8 F (36.6 C)  Temp Source Oral  BP 138/70  MAP (mmHg) 90  Pulse Rate 61  Pulse Rate Source Monitor  ECG Heart Rate 61  Resp 16  Oxygen Therapy  O2 Device Nasal Cannula  O2 Flow Rate (L/min) 2 L/min  Pain Assessment  Pain Assessment No/denies pain   Pt arrived from the ED. Denies any pain. Pt A&0x4.  Pt educated regarding call bell, room, unit, fall safety precautions, and bed alarm. Heart monitor applied and verified. Will monitor.

## 2017-04-12 NOTE — Procedures (Signed)
Patient seen on Hemodialysis. QB 400, UF goal 3.5L Treatment adjusted as needed.  Elmarie Shiley MD [facility]. Office # [phone] Pager # [phone] 9:11 AM

## 2017-04-12 NOTE — Progress Notes (Signed)
Spoke with patient and wife using the [employer] interpreter #[id number] in [place].  Patient does not have insulin pump supplies here at the hospital. Patient's wife will go home to get insulin and catheter insertion set. Discussed the fact that he will need to receive SQ insulin today and then can restart his pump tomorrow. Not sure when patient will be discharged. Dr. Algis Liming ordered Levemir 10 units to be given now and continue Novolog correction scale TID & HS. Will need to be sure that insulin pump is not started back until tomorrow.   Harvel Ricks RN BSN CDE Diabetes Coordinator Pager: [phone]  8am-5pm

## 2017-04-12 NOTE — Progress Notes (Signed)
Per RN request, pt removed his insulin pump. DM coordinator consult requested. Will monitor.

## 2017-04-12 NOTE — Progress Notes (Signed)
Patient ID: Kenneth [name], male   DOB: 04-14-1956, 61 y.o.   MRN: [id number]  [facility] Progress Note   Assessment/ Plan:   1. Chest Pain: minimally elevated troponin level noted, unclear upward trend and suspect this was likely from HTN urgency. He has h/o CAD s/p 3 vessel CABG in 2017.  2. ESRD: continue HD on MWF schedule with efforts at lowering EDW given presentation with pulmonary edema and HTN urgency.  3. Anemia:Lower Hgb noted this AM, no overt loss.  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 Nutrition: Continue renal diet and renal MVI 6. Hypertension:Blood pressure better overnight-monitor with UF at HD. I will DC HCTZ.   Subjective:   Reports to be feeling better this morning after an unevetful night of sleep. Denies any chest pain or shortness of breath.   Objective:   BP 136/61 (BP Location: Right Arm)   Pulse 65   Temp 98 F (36.7 C) (Oral)   Resp 18   Ht 5\' 6"  (1.676 m)   Wt 66 kg (145 lb 8.1 oz) Comment: Standing Weight  SpO2 97%   BMI 23.48 kg/m   Physical Exam: MNO:[id number] resting on dialysis. YBF:XOVAN RRR, Normal S1 with loud S2 Resp:CTA bilaterally, no rales/rhonchi VBT:YOMA, flat, non-tender, BS normal Ext:No LE edema. Left BCF cannulated.    Labs: BMET  Recent Labs Lab 04/11/17 1529 04/11/17 2007 04/12/17 0219  NA 136  --  136  K 6.1* 5.0 5.0  CL 98*  --  98*  CO2 27  --  27  GLUCOSE 98  --  68  BUN 33*  --  38*  CREATININE 5.56*  --  6.06*  CALCIUM 8.8*  --  8.8*   CBC  Recent Labs Lab 04/11/17 1529 04/12/17 0219  WBC 6.0 6.1  NEUTROABS 3.5  --   HGB 10.8* 9.7*  HCT 33.0* 30.1*  MCV 93.0 92.6  PLT 151 167   Medications:    . aspirin  81 mg Oral Daily  . atorvastatin  80 mg Oral q1800  . citalopram  20 mg Oral Daily  . clopidogrel  75 mg Oral Q lunch  . ezetimibe  10 mg Oral Daily  . gabapentin  300 mg Oral QHS  . [START ON 04/13/2017] heparin  4,500 Units Dialysis Once in dialysis  .  heparin  5,000 Units Subcutaneous Q8H  . hydrALAZINE  50 mg Oral BID  . losartan  50 mg Oral Daily   And  . hydrochlorothiazide  12.5 mg Oral Daily  . insulin aspart  0-9 Units Subcutaneous Q4H  . metoprolol succinate  12.5 mg Oral QHS  . pantoprazole  40 mg Oral Q lunch  . sodium chloride flush  3 mL Intravenous Q12H  . vitamin C  1,000 mg Oral Daily   Elmarie Shiley, MD 04/12/2017, 8:52 AM

## 2017-04-12 NOTE — Progress Notes (Addendum)
Results for HANZ, WINTERHALTER (MRN [id number]) as of 04/12/2017 09:15  Ref. Range 04/11/2017 21:24 04/12/2017 00:31 04/12/2017 01:05 04/12/2017 02:12 04/12/2017 08:39  Glucose-Capillary Latest Ref Range: 65 - 99 mg/dL 66 102 (H) 93 73 150 (H)  Patient is currently in hemodialysis.  Patient has not received insulin this am. Patient does not have insulin pump on at this time per dialysis staff RN.  According to notes from 5/18, patient's total basal on pump = 21.6 units/24 hours.  If insulin pump is not restarted, recommend starting Levemir 15 units daily and continuing Novolog SENSITIVE correction scale TID & HS if eating. May need Novolog 4-5 units TID when eating.  Recommend starting Levemir when out of hemodialysis and back to floor.   Harvel Ricks RN BSN CDE Diabetes Coordinator Pager: [phone]  8am-5pm

## 2017-04-12 NOTE — Progress Notes (Signed)
Spoke to patient in dialysis. States that he does have his insulin pump supplies with him at the hospital.  Patient would like to put his pump back on. Spoke with Dr. Algis Liming about patient's insulin orders. He suggested that if patient has insulin pump supplies, then he could start back his pump after dialysis. Spoke with staff RN on 3W and asked her to call me when patient is back in his room.   Harvel Ricks RN BSN CDE Diabetes Coordinator Pager: [phone]  8am-5pm

## 2017-04-13 ENCOUNTER — Inpatient Hospital Stay (HOSPITAL_BASED_OUTPATIENT_CLINIC_OR_DEPARTMENT_OTHER)
Admission: EM | Admit: 2017-04-13 | Discharge: 2017-04-16 | DRG: 304 | Disposition: A | Discharge status: Home / Self Care | Payer: Medicare Other | Attending: Internal Medicine | Admitting: Internal Medicine

## 2017-04-13 ENCOUNTER — Encounter (HOSPITAL_BASED_OUTPATIENT_CLINIC_OR_DEPARTMENT_OTHER): Payer: Self-pay

## 2017-04-13 ENCOUNTER — Emergency Department (HOSPITAL_BASED_OUTPATIENT_CLINIC_OR_DEPARTMENT_OTHER): Payer: Medicare Other

## 2017-04-13 DIAGNOSIS — I161 Hypertensive emergency: Principal | ICD-10-CM | POA: Diagnosis present

## 2017-04-13 DIAGNOSIS — I5032 Chronic diastolic (congestive) heart failure: Secondary | ICD-10-CM | POA: Diagnosis present

## 2017-04-13 DIAGNOSIS — D638 Anemia in other chronic diseases classified elsewhere: Secondary | ICD-10-CM | POA: Diagnosis present

## 2017-04-13 DIAGNOSIS — Z79899 Other long term (current) drug therapy: Secondary | ICD-10-CM

## 2017-04-13 DIAGNOSIS — F329 Major depressive disorder, single episode, unspecified: Secondary | ICD-10-CM | POA: Diagnosis present

## 2017-04-13 DIAGNOSIS — I200 Unstable angina: Secondary | ICD-10-CM

## 2017-04-13 DIAGNOSIS — N186 End stage renal disease: Secondary | ICD-10-CM | POA: Diagnosis present

## 2017-04-13 DIAGNOSIS — E1122 Type 2 diabetes mellitus with diabetic chronic kidney disease: Secondary | ICD-10-CM

## 2017-04-13 DIAGNOSIS — I132 Hypertensive heart and chronic kidney disease with heart failure and with stage 5 chronic kidney disease, or end stage renal disease: Secondary | ICD-10-CM | POA: Diagnosis present

## 2017-04-13 DIAGNOSIS — Z794 Long term (current) use of insulin: Secondary | ICD-10-CM

## 2017-04-13 DIAGNOSIS — Z992 Dependence on renal dialysis: Secondary | ICD-10-CM

## 2017-04-13 DIAGNOSIS — Z87891 Personal history of nicotine dependence: Secondary | ICD-10-CM

## 2017-04-13 DIAGNOSIS — E875 Hyperkalemia: Secondary | ICD-10-CM | POA: Diagnosis present

## 2017-04-13 DIAGNOSIS — Z951 Presence of aortocoronary bypass graft: Secondary | ICD-10-CM

## 2017-04-13 DIAGNOSIS — I25118 Atherosclerotic heart disease of native coronary artery with other forms of angina pectoris: Secondary | ICD-10-CM | POA: Diagnosis present

## 2017-04-13 DIAGNOSIS — R55 Syncope and collapse: Secondary | ICD-10-CM | POA: Diagnosis present

## 2017-04-13 DIAGNOSIS — E118 Type 2 diabetes mellitus with unspecified complications: Secondary | ICD-10-CM | POA: Diagnosis present

## 2017-04-13 DIAGNOSIS — Z833 Family history of diabetes mellitus: Secondary | ICD-10-CM

## 2017-04-13 DIAGNOSIS — Z8249 Family history of ischemic heart disease and other diseases of the circulatory system: Secondary | ICD-10-CM

## 2017-04-13 DIAGNOSIS — I2583 Coronary atherosclerosis due to lipid rich plaque: Secondary | ICD-10-CM

## 2017-04-13 DIAGNOSIS — I259 Chronic ischemic heart disease, unspecified: Secondary | ICD-10-CM

## 2017-04-13 DIAGNOSIS — Z7982 Long term (current) use of aspirin: Secondary | ICD-10-CM

## 2017-04-13 DIAGNOSIS — K219 Gastro-esophageal reflux disease without esophagitis: Secondary | ICD-10-CM | POA: Diagnosis present

## 2017-04-13 DIAGNOSIS — E1142 Type 2 diabetes mellitus with diabetic polyneuropathy: Secondary | ICD-10-CM | POA: Diagnosis present

## 2017-04-13 DIAGNOSIS — R079 Chest pain, unspecified: Secondary | ICD-10-CM | POA: Diagnosis present

## 2017-04-13 DIAGNOSIS — I2510 Atherosclerotic heart disease of native coronary artery without angina pectoris: Secondary | ICD-10-CM | POA: Diagnosis present

## 2017-04-13 DIAGNOSIS — I480 Paroxysmal atrial fibrillation: Secondary | ICD-10-CM | POA: Diagnosis present

## 2017-04-13 DIAGNOSIS — R69 Illness, unspecified: Secondary | ICD-10-CM

## 2017-04-13 DIAGNOSIS — Z7902 Long term (current) use of antithrombotics/antiplatelets: Secondary | ICD-10-CM

## 2017-04-13 DIAGNOSIS — I5022 Chronic systolic (congestive) heart failure: Secondary | ICD-10-CM

## 2017-04-13 DIAGNOSIS — I252 Old myocardial infarction: Secondary | ICD-10-CM

## 2017-04-13 LAB — COMPREHENSIVE METABOLIC PANEL
ALK PHOS: 108 U/L (ref 38–126)
ALT: 21 U/L (ref 17–63)
AST: 22 U/L (ref 15–41)
Albumin: 4 g/dL (ref 3.5–5.0)
Anion gap: 12 (ref 5–15)
BILIRUBIN TOTAL: 0.8 mg/dL (ref 0.3–1.2)
BUN: 36 mg/dL — AB (ref 6–20)
CALCIUM: 8.7 mg/dL — AB (ref 8.9–10.3)
CO2: 26 mmol/L (ref 22–32)
CREATININE: 5.87 mg/dL — AB (ref 0.61–1.24)
Chloride: 93 mmol/L — ABNORMAL LOW (ref 101–111)
GFR, EST AFRICAN AMERICAN: 11 mL/min — AB (ref >60)
GFR, EST NON AFRICAN AMERICAN: 9 mL/min — AB (ref >60)
Glucose, Bld: 326 mg/dL — ABNORMAL HIGH (ref 65–99)
Potassium: 5.6 mmol/L — ABNORMAL HIGH (ref 3.5–5.1)
Sodium: 131 mmol/L — ABNORMAL LOW (ref 135–145)
TOTAL PROTEIN: 7.4 g/dL (ref 6.5–8.1)

## 2017-04-13 LAB — CBC WITH DIFFERENTIAL/PLATELET
BASOS ABS: 0 10*3/uL (ref 0.0–0.1)
Basophils Relative: 1 %
Eosinophils Absolute: 0.3 10*3/uL (ref 0.0–0.7)
Eosinophils Relative: 5 %
HEMATOCRIT: 32.7 % — AB (ref 39.0–52.0)
Hemoglobin: 10.7 g/dL — ABNORMAL LOW (ref 13.0–17.0)
LYMPHS ABS: 1.7 10*3/uL (ref 0.7–4.0)
LYMPHS PCT: 30 %
MCH: 31.3 pg (ref 26.0–34.0)
MCHC: 32.7 g/dL (ref 30.0–36.0)
MCV: 95.6 fL (ref 78.0–100.0)
Monocytes Absolute: 0.7 10*3/uL (ref 0.1–1.0)
Monocytes Relative: 13 %
NEUTROS ABS: 2.8 10*3/uL (ref 1.7–7.7)
Neutrophils Relative %: 51 %
Platelets: 172 10*3/uL (ref 150–400)
RBC: 3.42 MIL/uL — AB (ref 4.22–5.81)
RDW: 15.3 % (ref 11.5–15.5)
WBC: 5.5 10*3/uL (ref 4.0–10.5)

## 2017-04-13 LAB — I-STAT CHEM 8, ED
BUN: 34 mg/dL — ABNORMAL HIGH (ref 6–20)
CHLORIDE: 94 mmol/L — AB (ref 101–111)
CREATININE: 5.6 mg/dL — AB (ref 0.61–1.24)
Calcium, Ion: 1.08 mmol/L — ABNORMAL LOW (ref 1.15–1.40)
GLUCOSE: 322 mg/dL — AB (ref 65–99)
HEMATOCRIT: 32 % — AB (ref 39.0–52.0)
HEMOGLOBIN: 10.9 g/dL — AB (ref 13.0–17.0)
POTASSIUM: 5.5 mmol/L — AB (ref 3.5–5.1)
Sodium: 133 mmol/L — ABNORMAL LOW (ref 135–145)
TCO2: 26 mmol/L (ref 0–100)

## 2017-04-13 LAB — CBG MONITORING, ED: GLUCOSE-CAPILLARY: 321 mg/dL — AB (ref 65–99)

## 2017-04-13 LAB — GLUCOSE, CAPILLARY
GLUCOSE-CAPILLARY: 160 mg/dL — AB (ref 65–99)
Glucose-Capillary: 184 mg/dL — ABNORMAL HIGH (ref 65–99)

## 2017-04-13 LAB — HEMOGLOBIN A1C
Hgb A1c MFr Bld: 5.8 % — ABNORMAL HIGH (ref 4.8–5.6)
Mean Plasma Glucose: 120 mg/dL

## 2017-04-13 LAB — TROPONIN I: TROPONIN I: 0.04 ng/mL — AB (ref <0.03)

## 2017-04-13 LAB — HEPATITIS B SURFACE ANTIGEN: HEP B S AG: NEGATIVE

## 2017-04-13 MED ORDER — MORPHINE SULFATE (PF) 4 MG/ML IV SOLN
4.0000 mg | Freq: Once | INTRAVENOUS | Status: AC
  Administered 2017-04-13: 4 mg via INTRAVENOUS
  Filled 2017-04-13: qty 1

## 2017-04-13 MED ORDER — METOPROLOL SUCCINATE ER 25 MG PO TB24: 25.0000 mg | ORAL_TABLET | Freq: Every day | ORAL | 0 refills | Status: DC

## 2017-04-13 MED ORDER — RENA-VITE PO TABS: 1.0000 | ORAL_TABLET | Freq: Every day | ORAL | 0 refills | Status: DC

## 2017-04-13 MED ORDER — METOPROLOL SUCCINATE ER 25 MG PO TB24: 25.0000 mg | ORAL_TABLET | Freq: Every day | ORAL | Status: DC

## 2017-04-13 MED ORDER — NITROGLYCERIN 2 % TD OINT
1.0000 [in_us] | TOPICAL_OINTMENT | Freq: Four times a day (QID) | TRANSDERMAL | Status: DC
  Administered 2017-04-13 – 2017-04-14 (×2): 1 [in_us] via TOPICAL
  Filled 2017-04-13 (×2): qty 1

## 2017-04-13 MED ORDER — ISOSORBIDE MONONITRATE ER 30 MG PO TB24: 30.0000 mg | ORAL_TABLET | Freq: Every day | ORAL | 0 refills | Status: DC

## 2017-04-13 MED ORDER — ISOSORBIDE MONONITRATE ER 30 MG PO TB24
30.0000 mg | ORAL_TABLET | Freq: Every day | ORAL | Status: DC
  Administered 2017-04-13: 30 mg via ORAL
  Filled 2017-04-13: qty 1

## 2017-04-13 MED ORDER — LOSARTAN POTASSIUM 50 MG PO TABS: 50.0000 mg | ORAL_TABLET | Freq: Every day | ORAL | 0 refills | Status: DC

## 2017-04-13 MED ORDER — HYDRALAZINE HCL 20 MG/ML IJ SOLN
10.0000 mg | Freq: Once | INTRAMUSCULAR | Status: AC
  Administered 2017-04-13: 10 mg via INTRAVENOUS
  Filled 2017-04-13: qty 1

## 2017-04-13 NOTE — ED Notes (Signed)
ED and PA Provider at bedside for assessment.

## 2017-04-13 NOTE — Progress Notes (Signed)
Patient ID: Kenneth [name], male   DOB: [birth year], 61 y.o.   MRN: [id number]  [facility] Progress Note   Assessment/ Plan:   1. Chest Pain: History of three-vessel CABG in 2017 and status post coronary angiogram in February 2018 that showed extensive collateralization supplying the entire RCA up to the point of proximal RCA occlusion and left to left collaterals to the D1. The plan is for maximization of medical management at this time-elevated but flat troponin suspected to be demand ischemia. From cardiology standpoint-stable for discharge. 2. ESRD: Underwent hemodialysis yesterday per his usual outpatient schedule-we discussed the need for restriction of intradialytic weight gain and compliance with hemodialysis. He will follow-up at his outpatient dialysis unit tomorrow. 3. Anemia:Lower Hgb noted this AM, no overt loss. Resume ESA per hemodialysis unit protocol. 4. CKD-MBD: Discussed low phosphorus diet and compliance with phosphorus binders. Continue VDRA. 5. Nutrition: Continue renal diet and renal MVI 6. Hypertension: Encouraged compliance with antihypertensive therapy/low sodium diet and hemodialysis ultrafiltration-needs cautious lowering of dry weight.   Subjective:   Continues to feel well, denies any chest pain or shortness of breath overnight.    Objective:   BP (!) 165/66 (BP Location: Right Arm)   Pulse 64   Temp 98 F (36.7 C) (Oral)   Resp 12   Ht 5\' 6"  (1.676 m)   Wt 63.3 kg (139 lb 9.6 oz)   SpO2 92%   BMI 22.53 kg/m   Physical Exam: Gen: Sitting comfortably in recliner. DUK:GURKY RRR, Normal S1 with loud S2 Resp:CTA bilaterally, no rales/rhonchi HCW:CBJS, flat, non-tender, BS normal Ext:No LE edema. Left BCF with good thrill and bruit.    Labs: BMET  Recent Labs Lab 04/11/17 1529 04/11/17 2007 04/12/17 0219 04/12/17 0722 04/12/17 1247 04/12/17 1518  NA 136  --  136  --   --   --   K 6.1* 5.0 5.0 4.0 4.1 4.8  CL 98*  --  98*  --   --    --   CO2 27  --  27  --   --   --   GLUCOSE 98  --  68  --   --   --   BUN 33*  --  38*  --   --   --   CREATININE 5.56*  --  6.06*  --   --   --   CALCIUM 8.8*  --  8.8*  --   --   --    CBC  Recent Labs Lab 04/11/17 1529 04/12/17 0219  WBC 6.0 6.1  NEUTROABS 3.5  --   HGB 10.8* 9.7*  HCT 33.0* 30.1*  MCV 93.0 92.6  PLT 151 167   Medications:    . aspirin  81 mg Oral Daily  . atorvastatin  80 mg Oral q1800  . citalopram  20 mg Oral Daily  . clopidogrel  75 mg Oral Q lunch  . ezetimibe  10 mg Oral Daily  . gabapentin  300 mg Oral QHS  . heparin  5,000 Units Subcutaneous Q8H  . insulin aspart  0-5 Units Subcutaneous QHS  . insulin aspart  0-9 Units Subcutaneous TID WC  . isosorbide mononitrate  30 mg Oral Daily  . losartan  50 mg Oral Daily  . metoprolol succinate  25 mg Oral QHS  . multivitamin  1 tablet Oral QHS  . pantoprazole  40 mg Oral Q lunch  . sodium chloride flush  3 mL Intravenous Q12H  .  vitamin C  1,000 mg Oral Daily   Elmarie Shiley, MD 04/13/2017, 7:55 AM

## 2017-04-13 NOTE — Progress Notes (Signed)
Progress Note  Patient Name: Kenneth [name] Date of Encounter: 04/13/2017  Primary Cardiologist: Dr. Aundra Dubin  Subjective   Patient denies any chest pain or SOB  Inpatient Medications    Scheduled Meds: . aspirin  81 mg Oral Daily  . atorvastatin  80 mg Oral q1800  . citalopram  20 mg Oral Daily  . clopidogrel  75 mg Oral Q lunch  . ezetimibe  10 mg Oral Daily  . gabapentin  300 mg Oral QHS  . heparin  5,000 Units Subcutaneous Q8H  . insulin aspart  0-5 Units Subcutaneous QHS  . insulin aspart  0-9 Units Subcutaneous TID WC  . isosorbide mononitrate  15 mg Oral Daily  . losartan  50 mg Oral Daily  . metoprolol succinate  12.5 mg Oral QHS  . multivitamin  1 tablet Oral QHS  . pantoprazole  40 mg Oral Q lunch  . sodium chloride flush  3 mL Intravenous Q12H  . vitamin C  1,000 mg Oral Daily   Continuous Infusions: . sodium chloride     PRN Meds: sodium chloride, acetaminophen, hydrALAZINE, HYDROcodone-acetaminophen, morphine injection, ondansetron (ZOFRAN) IV, sodium chloride flush   Vital Signs    Vitals:   04/12/17 1644 04/12/17 1938 04/12/17 2211 04/13/17 0429  BP: (!) 144/65 (!) 150/62 (!) 146/70 (!) 165/66  Pulse: 72 69 72 64  Resp: (!) 23 17 17 12   Temp: 98.5 F (36.9 C)   98 F (36.7 C)  TempSrc: Oral   Oral  SpO2: 99% 90% 100% 92%  Weight:    139 lb 9.6 oz (63.3 kg)  Height:        Intake/Output Summary (Last 24 hours) at 04/13/17 0736 Last data filed at 04/12/17 2200  Gross per 24 hour  Intake              360 ml  Output             3000 ml  Net            -2640 ml   Filed Weights   04/12/17 0750 04/12/17 1200 04/13/17 0429  Weight: 145 lb 8.1 oz (66 kg) 138 lb 14.2 oz (63 kg) 139 lb 9.6 oz (63.3 kg)    Telemetry    NSR - Personally Reviewed  ECG    NSR with LVH with repol abnormality and inferior infarct - Personally Reviewed  Physical Exam   GEN: No acute distress.   Neck: No JVD Cardiac: RRR, no murmurs, rubs, or gallops.    Respiratory: Clear to auscultation bilaterally. GI: Soft, nontender, non-distended  MS: No edema; No deformity. Neuro:  Nonfocal  Psych: Normal affect   Labs    Chemistry Recent Labs Lab 04/11/17 1529  04/12/17 0219 04/12/17 0722 04/12/17 1247 04/12/17 1518  NA 136  --  136  --   --   --   K 6.1*  < > 5.0 4.0 4.1 4.8  CL 98*  --  98*  --   --   --   CO2 27  --  27  --   --   --   GLUCOSE 98  --  68  --   --   --   BUN 33*  --  38*  --   --   --   CREATININE 5.56*  --  6.06*  --   --   --   CALCIUM 8.8*  --  8.8*  --   --   --  PROT 7.0  --   --   --   --   --   ALBUMIN 3.7  --   --   --   --   --   AST 37  --   --   --   --   --   ALT 33  --   --   --   --   --   ALKPHOS 112  --   --   --   --   --   BILITOT 0.7  --   --   --   --   --   GFRNONAA 10*  --  9*  --   --   --   GFRAA 12*  --  11*  --   --   --   ANIONGAP 11  --  11  --   --   --   < > = values in this interval not displayed.   Hematology Recent Labs Lab 04/11/17 1529 04/12/17 0219  WBC 6.0 6.1  RBC 3.55* 3.25*  HGB 10.8* 9.7*  HCT 33.0* 30.1*  MCV 93.0 92.6  MCH 30.4 29.8  MCHC 32.7 32.2  RDW 15.2 15.4  PLT 151 167    Cardiac Enzymes Recent Labs Lab 04/11/17 1529 04/11/17 2007 04/12/17 0219 04/12/17 0722  TROPONINI 0.08* 0.08* 0.08* 0.06*   No results for input(s): TROPIPOC in the last 168 hours.   BNP Recent Labs Lab 04/11/17 1529  BNP 1,724.5*     DDimer No results for input(s): DDIMER in the last 168 hours.   Radiology    Dg Chest 2 View  Result Date: 04/11/2017 CLINICAL DATA:  Patient with chest pain. EXAM: CHEST  2 VIEW COMPARISON:  Chest radiograph 01/18/2017. FINDINGS: Monitoring leads overlie the patient. Stable cardiomegaly status post median sternotomy. Bilateral perihilar interstitial opacities. Small bilateral pleural effusions. No pneumothorax. IMPRESSION: Mild bilateral interstitial pulmonary opacities raising the possibility of mild edema. Small bilateral pleural  effusions. Electronically Signed   By: Lovey Newcomer M.D.   On: 04/11/2017 16:55    Cardiac Studies   2D echo 12/16/2016 Study Conclusions  - Left ventricle: The cavity size was normal. There was moderate   concentric hypertrophy. Systolic function was normal. The   estimated ejection fraction was in the range of 55% to 60%. Wall   motion was normal; there were no regional wall motion   abnormalities. Doppler parameters are consistent with abnormal   left ventricular relaxation (grade 1 diastolic dysfunction).   Doppler parameters are consistent with elevated ventricular   end-diastolic filling pressure. - Aortic valve: Trileaflet; mildly thickened, mildly calcified   leaflets. There was no regurgitation. - Aortic root: The aortic root was normal in size. - Mitral valve: There was mild regurgitation. - Left atrium: The atrium was mildly dilated. - Right ventricle: The cavity size was normal. Wall thickness was   normal. Systolic function was normal. - Right atrium: The atrium was normal in size. - Tricuspid valve: There was no regurgitation. - Inferior vena cava: The vessel was normal in size. - Pericardium, extracardiac: There was no pericardial effusion.  Impressions:  - No significant difference since the last study on 09/25/2016.  Cardiac Cath 10/2016 Conclusion     Mid LAD to Dist LAD lesion, 90 %stenosed.  Ost 2nd Diag to 2nd Diag lesion, 95 %stenosed.  Ramus lesion, 80 %stenosed.  Prox Cx lesion, 90 %stenosed.  Ost 1st Mrg to 1st Mrg lesion, 100 %stenosed.  Prox  RCA lesion, 95 %stenosed.  Mid RCA lesion, 100 %stenosed.  SVG and is normal in caliber and anatomically normal.  LIMA and is normal in caliber and anatomically normal.  Dist LAD lesion, 20 %stenosed.  Graft was visualized by angiography and is normal in caliber and anatomically normal.  Origin to Prox Graft lesion, 100 %stenosed.  Ost LM to LM lesion, 40 %stenosed.   Normal LV function with  an ejection fraction of 50% with mild mid anterolateral and mid inferior hypocontractility.  Significant native CAD with 30% smooth distal left main stenosis; diffuse 90% proximal to mid LAD stenoses with 95% diffuse stenosis in the diagonal vessel, which was not bypassed; diffuse 80% stenosis in a small intermediate vessel; 90% proximal left circumflex stenosis, without visualization antegrade of the OM1 vessel; and total proximal RCA occlusion.  Patent LIMA graft supplying the mid LAD with some views suggesting a mild 20% eccentric narrowing just beyond the anastomosis.  There is collateralization from the distal LAD to the distal RCA and faint collateralization to the distal diagonal vessel.  Patent SVG supplying the large OM branch with extensive collateralization supplying the entire RCA up to the point of proximal RCA occlusion.  Occluded SVG which had supplied the RCA.  RECOMMENDATION: Will initially attempt continued medical therapy with optimization of medical management.     Patient Profile     61 y.o. male with history of CAD s/p CABG with cath 10/2016 with patent LIMA to LAD, with occluded SVG-RCA and patent SVG-OM with extensive collateralization supplying the entire RCA up to the point of proximal RCA occlusion and left to left collaterals to the diagonal. Medical management was recommended at prior cath.  He is currently not on any antianginal therapy other than BB  Assessment & Plan    1. Chest pain, CAD s/p CABG (06/05/16) and s/p heart cath (11/03/16) with occluded SVG-RCA and patent SVG-OM with extensive collateralization supplying the entire RCA up to the point of proximal RCA occlusion and left to left collaterals to the D1.  - per previous cath notes, medical management as BP allows - troponin 0.08 --> 0.08 --> 0.08 --> 0.06 - trend is minimal elevation and flat, consistent with demand ischemia in setting of ESRD on HD instead of ACS - review of previous troponins and  he rarely has a negative value - Hydralazine stopped yesterday to allow more BP room for titration of antianginal Rx - Imdur started at 15mg  daily which he tolerated well yesterday.  Will increase to 30mg  daily.  - increase Toprol to 25mg  daily as BP also elevated. - He has not had any further CP  2. Chronic systolic heart failure by echo 05/31/16, recovery of function by echo on 12/16/16, ischemic cardiomyopathy - fluid status per dialysis and nephrology             - Echo (9/17) with EF 35-40%, grade II diastolic dysfunction.              - Echo (1/18) with EF 60-65%, grade II diastolic dysfunction.             - Echo (4/18) with EF 55-60%, moderate LVH - continue Losartan 50mg  daily, Toprol XL  - fluid management per nephrology   3. Post-op Afib (CABG)  - remains in NSR on BB  4. ESRD on HD - per nephrology  5. DM - per primary team  6.  HTN - BP elevated today after stopping Hydralazine - increase Imdur to 30mg  daily -  increase Toprol to 25mg  daily - would prefer to titrate BB as HR allows to control HTN as it will also help with antianginal Rx  Patient is stable from a cardiac standpoint for discharge home.  Will set up OV in 1-2 weeks.  Will sign off.  Please call with any questions.    Signed, Fransico Him, MD  04/13/2017, 7:36 AM

## 2017-04-13 NOTE — ED Notes (Signed)
Pt is attempting urine sample.

## 2017-04-13 NOTE — ED Notes (Signed)
Pt went unresponsive to verbal stimuli while ekg was being performed, sternal rub to have him respond, approximately 10 seconds

## 2017-04-13 NOTE — Consult Note (Addendum)
[facility] Nurse wound consult note Reason for Consult: Consult requested for right toe wounds.  Pt has been followed by the vascular team in the past for previous full thickness wounds which he states have greatly improved. Pt and wife are well -informed regarding topical treatment and have been performing daily dressing changes at home with a collagen dressing, which is not available in the [facility].   Wound type: Full thickness healing wounds to anterior 2nd and 3rd toes; each site is approx .8X.2X.1cm, pink and dry.  Inner space between the 3rd toe is .5X.5X.1cm, darker-colored dry wound bed surrounded by a callous. Dressing procedure/placement/frequency: Wife may change the dressing to right toes Q day and she has her own supplies at bed bedside as ordered by their physician: Moisten previous dressing with NS to gently remove, then cut small pieces of collagen dressing and apply over toe wounds (3), then moisten new dressing slightly.  Apply 2X2 between toes and cover all sites with kling dressing. Discussed plan of care with pt and wife and they verbalized understanding and they plan to resume follow up with their physician after discharge. Please re-consult if further assistance is needed.  Thank-you,  Julien Girt MSN, [name], [place], [place], [place]

## 2017-04-13 NOTE — Discharge Summary (Signed)
Physician Discharge Summary  Kenneth [name] PQZ:[id number] DOB: 1955/10/28  PCP: [name], MD  Admit date: 04/11/2017 Discharge date: 04/13/2017  Recommendations for Outpatient Follow-up:  1. Dr. [name], PCP in 1 week. 2. Hemodialysis Center: Continue regular hemodialysis appointments on Mondays, Wednesdays and Fridays. 3. Dr. Diamantina Monks, Cardiology: Office will arrange outpatient follow-up in 1-2 weeks.  Home Health: None Equipment/Devices: None    Discharge Condition: Improved and stable  CODE STATUS: Full  Diet recommendation: Heart healthy & diabetic diet.  Discharge Diagnoses:  Principal Problem:   Chest pain Active Problems:   CHF (congestive heart failure), NYHA class I, chronic, systolic (HCC)   S/P CABG x 3   ESRD (end stage renal disease) (HCC)   CAD (coronary artery disease)   DM (diabetes mellitus), type 2 with renal complications (HCC)   Elevated troponin I level   Gastroesophageal reflux disease   Anemia, chronic renal failure   Volume overload   Diabetic foot ulcer ([place])   Brief Summary: 61 year old male with a PMH of ESRD on MWF HD, CAD status post CABG 05/2016, heart cath 11/03/16 showed occluded SVG-RCA and patent SVG-OM with extensive collateralization supplying the entire RCA up to the point of proximal RCA occlusion >no intervention done, medical treatment, chronic systolic CHF (September 3354 LVEF 35%), DM type I on insulin pump, HTN, HLD, chronic right foot wound presented to ED with gradual onset of left-sided chest pain, moderate to severe in intensity, nonradiating, constant without aggravating or alleviating factors and associated with mild dyspnea and nausea. In the ED, blood pressure 202/77, EKG sinus rhythm with PVC, LVH, chest x-ray suggestive of mild pulmonary edema, troponin 0.08, BNP 1725. Given a dose of aspirin 324 MG, started on NTG infusion, received cocktail for hyperkalemia. Cardiology and Nephrology were consulted and he was  admitted to stepdown unit.   Assessment & Plan:   1. Chest pain with mild troponin elevation: Cardiac history as documented above. His presentation may have been due to hypertensive urgency. Cardiology consultation appreciated. Currently on aspirin, Plavix, atorvastatin, Zetia, metoprolol. Troponin has flat trend. As per cardiology, he has rarely had a negative troponin in the past, likely related to his RCA. Due to response of chest pain to nitro drip, they discontinued hydralazine and started Imdur. No recurrence of chest pain. Cardiology has seen today and have increased Imdur to 30 mg daily and Toprol-XL from 12.5-25 MG at bedtime since BP also elevated. They indicate that minimal elevation of troponin and flat trend is consistent with demand ischemia in the setting of ESRD on HD rather than ACS. Per previous Notes, medical management as blood pressure allows. 2. Acute on Chronic systolic CHF: Echo 5/62: EF 35-40 percent and grade 2 diastolic dysfunction but follow-up echo 4/18: LVEF 55-60 percent. Volume management across dialysis. Volume status significantly improved. Stable now. As discussed with nephrologist, continue prior home dose of Lasix. Hyzaar was changed to Cozaar. Imdur was started this admission. 3. Postop A. fib (CABG): Remains in sinus rhythm. Continue Toprol-XL. 4. ESRD on MWF HD: Nephrology consultation and follow-up appreciated. Discussed with nephrology. Efforts ongoing lower EDW given presentation with hypertensive urgency and pulmonary edema on chest x-ray. Hyperkalemia resolved. Nephrology has cleared for discharge home. If he has recurrent hyperkalemia, then Cozaar will need to be discontinued. 5. Type I DM with renal disease: A1c 5.8. Since patient did not have his insulin pump supplies in the hospital, he received a dose of Levemir 10 units yesterday and  was placed on SSI. At discharge, he will resume his insulin pump. RN verified with patient that he does not take any other  insulins. Mildly uncontrolled CBGs in the hospital. 6. Hypertensive urgency/Essential hypertension: Was on NTG infusion on admission. Now off. Mildly uncontrolled. Volume management across HD. Hyzaar changed to Cozaar. Toprol-XL was increased as indicated above. Imdur was started this admission. 7. Anemia: Hemoglobin has dropped from 10.8 > 9.7. No overt bleeding reported. Follow CBCs periodically at HD. 8. R foot wounds:  wound care consultation appreciated. Patient has full-thickness healing wounds to anterior second and third toes and patient and family are well aware of dressing changes.  Consultants:  Cardiology Nephrology    Procedures:  Hemodialysis   Discharge Instructions  Discharge Instructions    (HEART FAILURE PATIENTS) Call MD:  Anytime you have any of the following symptoms: 1) 3 pound weight gain in 24 hours or 5 pounds in 1 week 2) shortness of breath, with or without a dry hacking cough 3) swelling in the hands, feet or stomach 4) if you have to sleep on extra pillows at night in order to breathe.    Complete by:  As directed    Call MD for:  difficulty breathing, headache or visual disturbances    Complete by:  As directed    Call MD for:  extreme fatigue    Complete by:  As directed    Call MD for:  persistant dizziness or light-headedness    Complete by:  As directed    Call MD for:  severe uncontrolled pain    Complete by:  As directed    Diet - low sodium heart healthy    Complete by:  As directed    Diet Carb Modified    Complete by:  As directed    Increase activity slowly    Complete by:  As directed        Medication List    STOP taking these medications   amLODipine 10 MG tablet Commonly known as:  NORVASC   hydrALAZINE 50 MG tablet Commonly known as:  APRESOLINE   losartan-hydrochlorothiazide 50-12.5 MG tablet Commonly known as:  HYZAAR   TOUJEO SOLOSTAR 300 UNIT/ML Sopn Generic drug:  Insulin Glargine     TAKE these medications    aspirin 81 MG chewable tablet Chew 1 tablet (81 mg total) by mouth daily.   atorvastatin 80 MG tablet Commonly known as:  LIPITOR TAKE ONE TABLET BY MOUTH ONCE DAILY   citalopram 20 MG tablet Commonly known as:  CELEXA Take 20 mg by mouth daily.   clopidogrel 75 MG tablet Commonly known as:  PLAVIX Take 1 tablet (75 mg total) by mouth daily. What changed:  when to take this   ezetimibe 10 MG tablet Commonly known as:  ZETIA Take 1 tablet (10 mg total) by mouth daily.   furosemide 80 MG tablet Commonly known as:  LASIX Take 80 mg by mouth 2 (two) times daily.   gabapentin 300 MG capsule Commonly known as:  NEURONTIN Take 300 mg by mouth at bedtime.   insulin pump Soln Inject into the skin continuous. NOVOLOG   isosorbide mononitrate 30 MG 24 hr tablet Commonly known as:  IMDUR Take 1 tablet (30 mg total) by mouth daily.   losartan 50 MG tablet Commonly known as:  COZAAR Take 1 tablet (50 mg total) by mouth daily.   metoprolol succinate 25 MG 24 hr tablet Commonly known as:  TOPROL-XL Take 1 tablet (25  mg total) by mouth at bedtime. What changed:  how much to take   multivitamin Tabs tablet Take 1 tablet by mouth at bedtime.   pantoprazole 40 MG tablet Commonly known as:  PROTONIX Take 40 mg by mouth daily with lunch.   vitamin C 500 MG tablet Commonly known as:  ASCORBIC ACID Take 1,000 mg by mouth daily.      Follow-up Information    Tasia Catchings Donnie Aho, MD. Schedule an appointment as soon as possible for a visit in 1 week(s).   Specialty:  Family Medicine Contact information: [address] [place] [phone]        Hemodialysis center Follow up.   Why:  Keep regular dialysis appointments on Mondays, Wednesdays & Fridays.       Larey Dresser, MD Follow up.   Specialty:  Cardiology Why:  Office will arrange follow up in 1-2 weeks. Contact information: [address] [place]  [address] [phone]          No Known Allergies    Procedures/Studies: Dg Chest 2 View  Result Date: 04/11/2017 CLINICAL DATA:  Patient with chest pain. EXAM: CHEST  2 VIEW COMPARISON:  Chest radiograph 01/18/2017. FINDINGS: Monitoring leads overlie the patient. Stable cardiomegaly status post median sternotomy. Bilateral perihilar interstitial opacities. Small bilateral pleural effusions. No pneumothorax. IMPRESSION: Mild bilateral interstitial pulmonary opacities raising the possibility of mild edema. Small bilateral pleural effusions. Electronically Signed   By: Lovey Newcomer M.D.   On: 04/11/2017 16:55      Subjective: Patient denies complaints. No recurrence of chest pain. Denies dyspnea, dizziness or lightheadedness.  Discharge Exam:  Vitals:   04/12/17 1938 04/12/17 2211 04/13/17 0429 04/13/17 1403  BP: (!) 150/62 (!) 146/70 (!) 165/66 (!) 149/60  Pulse: 69 72 64 (!) 58  Resp: 17 17 12 13   Temp:   98 F (36.7 C) 98.9 F (37.2 C)  TempSrc:   Oral Oral  SpO2: 90% 100% 92% 99%  Weight:   63.3 kg (139 lb 9.6 oz)   Height:        General exam: Pleasant middle-aged male seen ambulating comfortably in his room. Respiratory system: clear to auscultation. No increased work of breathing. Cardiovascular system: S1 & S2 heard, RRR. No JVD, murmurs, rubs, gallops or clicks. No pedal edema. Telemetry: Sinus rhythm. Gastrointestinal system: Abdomen is nondistended, soft and nontender. No organomegaly or masses felt. Normal bowel sounds heard. Central nervous system: Alert and oriented. No focal neurological deficits. Extremities: Symmetric 5 x 5 power. Skin: No rashes, lesions or ulcers Psychiatry: Judgement and insight appear normal. Mood & affect appropriate.     The results of significant diagnostics from this hospitalization (including imaging, microbiology, ancillary and laboratory) are listed below for reference.     Microbiology: Recent Results (from the past 240  hour(s))  MRSA PCR Screening     Status: None   Collection Time: 04/12/17  2:42 AM  Result Value Ref Range Status   MRSA by PCR NEGATIVE NEGATIVE Final    Comment:        The GeneXpert MRSA Assay (FDA approved for NASAL specimens only), is one component of a comprehensive MRSA colonization surveillance program. It is not intended to diagnose MRSA infection nor to guide or monitor treatment for MRSA infections.      Labs: CBC:  Recent Labs Lab 04/11/17 1529 04/12/17 0219  WBC 6.0 6.1  NEUTROABS 3.5  --   HGB  10.8* 9.7*  HCT 33.0* 30.1*  MCV 93.0 92.6  PLT 151 811   Basic Metabolic Panel:  Recent Labs Lab 04/11/17 1529 04/11/17 2007 04/12/17 0219 04/12/17 0722 04/12/17 1247 04/12/17 1518  NA 136  --  136  --   --   --   K 6.1* 5.0 5.0 4.0 4.1 4.8  CL 98*  --  98*  --   --   --   CO2 27  --  27  --   --   --   GLUCOSE 98  --  68  --   --   --   BUN 33*  --  38*  --   --   --   CREATININE 5.56*  --  6.06*  --   --   --   CALCIUM 8.8*  --  8.8*  --   --   --    Liver Function Tests:  Recent Labs Lab 04/11/17 1529  AST 37  ALT 33  ALKPHOS 112  BILITOT 0.7  PROT 7.0  ALBUMIN 3.7   BNP (last 3 results)  Recent Labs  10/30/16 2024 12/15/16 2148 04/11/17 1529  BNP 1,900.0* 881.1* 1,724.5*   Cardiac Enzymes:  Recent Labs Lab 04/11/17 1529 04/11/17 2007 04/12/17 0219 04/12/17 0722  TROPONINI 0.08* 0.08* 0.08* 0.06*   CBG:  Recent Labs Lab 04/12/17 1256 04/12/17 1642 04/12/17 2151 04/13/17 0728 04/13/17 1121  GLUCAP 239* 178* 132* 160* 184*   Hgb A1c  Recent Labs  04/12/17 0219  HGBA1C 5.8*       Time coordinating discharge: Over 30 minutes  SIGNED:  Vernell Leep, MD, FACP, FHM. [facility] Pager [phone]  If 7PM-7AM, please contact night-coverage [email or web address] Password [email or web address] 04/13/2017, 2:25 PM

## 2017-04-13 NOTE — ED Triage Notes (Signed)
Pt just left the hospital at 1600, has had chest pain since arriving on 7/19, not new chest pain, is concerned because his BP is still high, just took night time meds prior to arrival

## 2017-04-13 NOTE — ED Notes (Signed)
Pt. Reports his head pain is in the back of his head and is too much.

## 2017-04-13 NOTE — Discharge Instructions (Signed)

## 2017-04-13 NOTE — ED Notes (Signed)
Patient transported to CT 

## 2017-04-13 NOTE — Progress Notes (Signed)
Spoke with wife and patient @ bedside. Wife has insulin pump supplies. Spoke with Dr. Algis Liming and plans to discharge patient home within the hour and ok for wife to restart insulin pump. Thank you, Nani Gasser. Kamen Hanken, RN, MSN, CDE  Diabetes Coordinator Inpatient Glycemic Control Team Team Pager [phone] (8am-5pm) 04/13/2017 12:38 PM

## 2017-04-14 ENCOUNTER — Encounter (HOSPITAL_COMMUNITY): Payer: Self-pay | Admitting: *Deleted

## 2017-04-14 DIAGNOSIS — I161 Hypertensive emergency: Secondary | ICD-10-CM

## 2017-04-14 DIAGNOSIS — I5032 Chronic diastolic (congestive) heart failure: Secondary | ICD-10-CM

## 2017-04-14 LAB — GLUCOSE, CAPILLARY
GLUCOSE-CAPILLARY: 223 mg/dL — AB (ref 65–99)
Glucose-Capillary: 173 mg/dL — ABNORMAL HIGH (ref 65–99)
Glucose-Capillary: 176 mg/dL — ABNORMAL HIGH (ref 65–99)

## 2017-04-14 LAB — I-STAT CHEM 8, ED
BUN: 37 mg/dL — ABNORMAL HIGH (ref 6–20)
CHLORIDE: 95 mmol/L — AB (ref 101–111)
Calcium, Ion: 1.13 mmol/L — ABNORMAL LOW (ref 1.15–1.40)
Creatinine, Ser: 6 mg/dL — ABNORMAL HIGH (ref 0.61–1.24)
GLUCOSE: 79 mg/dL (ref 65–99)
HEMATOCRIT: 30 % — AB (ref 39.0–52.0)
HEMOGLOBIN: 10.2 g/dL — AB (ref 13.0–17.0)
POTASSIUM: 4.4 mmol/L (ref 3.5–5.1)
Sodium: 137 mmol/L (ref 135–145)
TCO2: 29 mmol/L (ref 0–100)

## 2017-04-14 LAB — TROPONIN I
TROPONIN I: 0.04 ng/mL — AB (ref <0.03)
TROPONIN I: 0.03 ng/mL — AB (ref <0.03)
Troponin I: <0.03 ng/mL (ref <0.03)

## 2017-04-14 LAB — CBG MONITORING, ED
GLUCOSE-CAPILLARY: 117 mg/dL — AB (ref 65–99)
GLUCOSE-CAPILLARY: 85 mg/dL (ref 65–99)
GLUCOSE-CAPILLARY: 147 mg/dL — AB (ref 65–99)
Glucose-Capillary: 81 mg/dL (ref 65–99)

## 2017-04-14 LAB — BASIC METABOLIC PANEL
Anion gap: 8 (ref 5–15)
BUN: 30 mg/dL — AB (ref 6–20)
CHLORIDE: 111 mmol/L (ref 101–111)
CO2: 23 mmol/L (ref 22–32)
CREATININE: 4.5 mg/dL — AB (ref 0.61–1.24)
Calcium: 6.4 mg/dL — CL (ref 8.9–10.3)
GFR calc non Af Amer: 13 mL/min — ABNORMAL LOW (ref >60)
GFR, EST AFRICAN AMERICAN: 15 mL/min — AB (ref >60)
Glucose, Bld: 60 mg/dL — ABNORMAL LOW (ref 65–99)
POTASSIUM: 3.2 mmol/L — AB (ref 3.5–5.1)
Sodium: 142 mmol/L (ref 135–145)

## 2017-04-14 MED ORDER — EZETIMIBE 10 MG PO TABS
10.0000 mg | ORAL_TABLET | Freq: Every day | ORAL | Status: DC
  Administered 2017-04-14 – 2017-04-16 (×3): 10 mg via ORAL
  Filled 2017-04-14 (×3): qty 1

## 2017-04-14 MED ORDER — DARBEPOETIN ALFA 60 MCG/0.3ML IJ SOSY
60.0000 ug | PREFILLED_SYRINGE | INTRAMUSCULAR | Status: DC
  Administered 2017-04-15: 60 ug via INTRAVENOUS
  Filled 2017-04-14: qty 0.3

## 2017-04-14 MED ORDER — CITALOPRAM HYDROBROMIDE 20 MG PO TABS
20.0000 mg | ORAL_TABLET | Freq: Every day | ORAL | Status: DC
  Administered 2017-04-14 – 2017-04-16 (×3): 20 mg via ORAL
  Filled 2017-04-14 (×3): qty 1

## 2017-04-14 MED ORDER — GABAPENTIN 300 MG PO CAPS
300.0000 mg | ORAL_CAPSULE | Freq: Every day | ORAL | Status: DC
  Administered 2017-04-14 – 2017-04-15 (×2): 300 mg via ORAL
  Filled 2017-04-14 (×2): qty 1

## 2017-04-14 MED ORDER — HEPARIN SODIUM (PORCINE) 5000 UNIT/ML IJ SOLN
5000.0000 [IU] | Freq: Three times a day (TID) | INTRAMUSCULAR | Status: DC
  Administered 2017-04-14 – 2017-04-16 (×5): 5000 [IU] via SUBCUTANEOUS
  Filled 2017-04-14 (×5): qty 1

## 2017-04-14 MED ORDER — FUROSEMIDE 80 MG PO TABS
80.0000 mg | ORAL_TABLET | Freq: Two times a day (BID) | ORAL | Status: DC
  Administered 2017-04-14 – 2017-04-16 (×3): 80 mg via ORAL
  Filled 2017-04-14 (×3): qty 1

## 2017-04-14 MED ORDER — ASPIRIN 81 MG PO CHEW
81.0000 mg | CHEWABLE_TABLET | Freq: Every day | ORAL | Status: DC
  Administered 2017-04-14 – 2017-04-16 (×3): 81 mg via ORAL
  Filled 2017-04-14 (×3): qty 1

## 2017-04-14 MED ORDER — MORPHINE SULFATE (PF) 2 MG/ML IV SOLN
2.0000 mg | INTRAVENOUS | Status: DC | PRN
  Administered 2017-04-14: 2 mg via INTRAVENOUS
  Filled 2017-04-14: qty 1

## 2017-04-14 MED ORDER — ACETAMINOPHEN 325 MG PO TABS
650.0000 mg | ORAL_TABLET | Freq: Once | ORAL | Status: AC
  Administered 2017-04-14: 650 mg via ORAL
  Filled 2017-04-14: qty 2

## 2017-04-14 MED ORDER — DOXERCALCIFEROL 4 MCG/2ML IV SOLN
1.0000 ug | INTRAVENOUS | Status: DC
  Filled 2017-04-14: qty 2

## 2017-04-14 MED ORDER — ACETAMINOPHEN 500 MG PO TABS
1000.0000 mg | ORAL_TABLET | Freq: Once | ORAL | Status: AC
  Administered 2017-04-14: 1000 mg via ORAL
  Filled 2017-04-14: qty 2

## 2017-04-14 MED ORDER — ACETAMINOPHEN 325 MG PO TABS
650.0000 mg | ORAL_TABLET | Freq: Four times a day (QID) | ORAL | Status: DC | PRN
  Administered 2017-04-14: 650 mg via ORAL
  Filled 2017-04-14: qty 2

## 2017-04-14 MED ORDER — NITROGLYCERIN 0.4 MG SL SUBL
0.4000 mg | SUBLINGUAL_TABLET | SUBLINGUAL | Status: DC | PRN
  Administered 2017-04-14 (×3): 0.4 mg via SUBLINGUAL

## 2017-04-14 MED ORDER — GI COCKTAIL ~~LOC~~: 30.0000 mL | Freq: Four times a day (QID) | ORAL | Status: DC | PRN

## 2017-04-14 MED ORDER — PANTOPRAZOLE SODIUM 40 MG PO TBEC
40.0000 mg | DELAYED_RELEASE_TABLET | Freq: Every day | ORAL | Status: DC
  Administered 2017-04-15: 40 mg via ORAL
  Filled 2017-04-14: qty 1

## 2017-04-14 MED ORDER — ACETAMINOPHEN 650 MG RE SUPP: 650.0000 mg | Freq: Four times a day (QID) | RECTAL | Status: DC | PRN

## 2017-04-14 MED ORDER — ISOSORBIDE MONONITRATE ER 30 MG PO TB24
30.0000 mg | ORAL_TABLET | Freq: Every day | ORAL | Status: DC
  Administered 2017-04-14 – 2017-04-16 (×3): 30 mg via ORAL
  Filled 2017-04-14 (×3): qty 1

## 2017-04-14 MED ORDER — NITROGLYCERIN 0.4 MG SL SUBL
SUBLINGUAL_TABLET | SUBLINGUAL | Status: AC
  Administered 2017-04-14: 0.4 mg via SUBLINGUAL
  Filled 2017-04-14: qty 3

## 2017-04-14 MED ORDER — METOPROLOL SUCCINATE ER 25 MG PO TB24
25.0000 mg | ORAL_TABLET | Freq: Every day | ORAL | Status: DC
  Administered 2017-04-14 – 2017-04-15 (×2): 25 mg via ORAL
  Filled 2017-04-14 (×2): qty 1

## 2017-04-14 MED ORDER — LOSARTAN POTASSIUM 50 MG PO TABS
50.0000 mg | ORAL_TABLET | Freq: Every day | ORAL | Status: DC
  Administered 2017-04-14 – 2017-04-16 (×3): 50 mg via ORAL
  Filled 2017-04-14 (×3): qty 1

## 2017-04-14 MED ORDER — HYDRALAZINE HCL 20 MG/ML IJ SOLN: 5.0000 mg | INTRAMUSCULAR | Status: DC | PRN

## 2017-04-14 MED ORDER — INSULIN ASPART 100 UNIT/ML ~~LOC~~ SOLN
0.0000 [IU] | Freq: Three times a day (TID) | SUBCUTANEOUS | Status: DC
  Administered 2017-04-14: 2 [IU] via SUBCUTANEOUS

## 2017-04-14 MED ORDER — INSULIN ASPART 100 UNIT/ML ~~LOC~~ SOLN: 0.0000 [IU] | Freq: Every day | SUBCUTANEOUS | Status: DC

## 2017-04-14 MED ORDER — ATORVASTATIN CALCIUM 80 MG PO TABS
80.0000 mg | ORAL_TABLET | Freq: Every day | ORAL | Status: DC
  Administered 2017-04-14 – 2017-04-15 (×2): 80 mg via ORAL
  Filled 2017-04-14 (×2): qty 1

## 2017-04-14 MED ORDER — CLOPIDOGREL BISULFATE 75 MG PO TABS
75.0000 mg | ORAL_TABLET | Freq: Every day | ORAL | Status: DC
  Administered 2017-04-15: 75 mg via ORAL
  Filled 2017-04-14: qty 1

## 2017-04-14 MED ORDER — MORPHINE SULFATE (PF) 4 MG/ML IV SOLN
4.0000 mg | Freq: Once | INTRAVENOUS | Status: AC
  Administered 2017-04-14: 4 mg via INTRAVENOUS
  Filled 2017-04-14: qty 1

## 2017-04-14 MED ORDER — PROMETHAZINE HCL 25 MG PO TABS: 12.5000 mg | ORAL_TABLET | Freq: Four times a day (QID) | ORAL | Status: DC | PRN

## 2017-04-14 NOTE — ED Provider Notes (Signed)
  Physical Exam  BP (!) 111/57   Pulse (!) 55   Temp 98.9 F (37.2 C) (Rectal)   Resp 13   Ht 5\' 6"  (1.676 m)   Wt 63 kg (139 lb)   SpO2 94%   BMI 22.44 kg/m   Physical Exam  ED Course  Procedures  MDM Discussed with Dr. Barbaraann Faster earlier today. Question whether patient the discharge since she's been monitored here for over 12 hours. Troponin is normal. However patient still having some chest pain. Will continue with admission. Still pending a bed for placement.       Davonna Belling, MD 04/14/17 [phone]

## 2017-04-14 NOTE — Progress Notes (Addendum)
Dr. Marily Memos paged requesting admission orders. Also, notified that wife took pt's insulin pump home. 12pm [id number] with no active insulin orders. Awaiting call back. Will continue to monitor.

## 2017-04-14 NOTE — ED Provider Notes (Addendum)
5:30 AM  Pt is a 61 year old male with history of end-stage renal disease on hemodialysis, CHF, NSTEMI who was initially seen by Dr. Vallery Ridge for chest pain and a syncopal event. Patient's workup revealed mild hyperkalemia and elevated troponin which appeared his baseline. Patient also initially very hypertensive but this improved with hydralazine and nitroglycerin. Patient was admitted to [facility] and is currently awaiting a bed. I wasn't certified nursing staff that he has an insulin pump and his glucose was 81. They are giving him something to eat and drink and we are holding his insulin pump at this time. I have ordered a repeat BMP and troponin.   Patient's repeat BMP has come back looking like there may be saline mixed with this blood.  Repeat chem 8 seems more appropriate. Potassium has improved to 4.4. Blood glucose is 79. He is eating and drinking without any complaints. He is hemodynamically stable and awaiting transport to [facility].  Repeat troponin is negative.   Ward, Delice Bison, DO 04/14/17 Townsend, Delice Bison, DO 04/14/17 [phone]

## 2017-04-14 NOTE — ED Provider Notes (Signed)
[place] DEPT [facility] Provider Note   CSN: [id number] Arrival date & time: 04/13/17  2105     History   Chief Complaint Chief Complaint  Patient presents with  . Chest Pain    HPI Kenneth [name] is a 61 y.o. male.  HPI Patient was just discharged from the hospital earlier this afternoon. He has been having ongoing chest pain reportedly central in nature. Patient's wife reports he has taken all of his evening medications as prescribed. Patient also reports headache. When in triage, patient became very pale and syncopal. He was emergently brought back to a room and monitoring established. Past Medical History:  Diagnosis Date  . Acute pulmonary edema ([place]) 05/31/2016  . Acute respiratory failure with hypoxemia ([place]) 05/31/2016  . CHF (congestive heart failure) ([place])   . 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 ESRD (end stage renal disease) on dialysis [facility])    "MWF; [place]" (01/18/2017)  . HCAP (healthcare-associated pneumonia)   . Ischemic rest pain of lower extremity ([place]) 02/06/2015  . 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 NSTEMI (non-ST elevated myocardial infarction) ([place])   . Pain in lower limb 02/06/2015  . Pain in the chest   . Pleural effusion   . 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 Shortness of breath     Patient Active Problem List   Diagnosis Date Noted  . Syncope 04/13/2017  . Anemia, chronic renal failure 04/11/2017  . Volume overload 04/11/2017  . Diabetic foot ulcer ([place]) 04/11/2017  . Hypertensive crisis   . Cellulitis in diabetic foot ([place]) 01/18/2017  . Chest pain 12/16/2016  . Abdominal pain 12/16/2016  . Age-related nuclear cataract of right eye 12/09/2016  . Pressure injury of skin 11/03/2016  . DM (diabetes mellitus), type 2 with renal complications ([place]) 62/94/7654  . Elevated troponin I level 10/31/2016  . ESRD (end stage renal disease) ([place]) 10/30/2016  . CAD (coronary artery disease) 10/30/2016  .  Gastroesophageal reflux disease 08/18/2016  . 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 CHF (congestive heart failure), NYHA class I, chronic, systolic ([place])   . Bursitis of left shoulder 05/31/2016  . Shortness of breath   . 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 Ischemic rest pain of lower extremity ([place]) 02/06/2015    Past Surgical History:  Procedure Laterality Date  . AV FISTULA PLACEMENT Left 06/22/2016   Procedure: ARTERIOVENOUS (AV) FISTULA CREATION LEFT UPPER ARM;  Surgeon: Rosetta Posner, MD;  Location: [facility];  Service: Vascular;  Laterality: Left;  . CARDIAC CATHETERIZATION N/A 05/31/2016   Procedure: Left Heart Cath and Coronary Angiography;  Surgeon: Jettie Booze, MD;  Location: [facility] CV LAB;  Service: Cardiovascular;  Laterality: N/A;  . CARDIAC CATHETERIZATION N/A 05/31/2016   Procedure: Right Heart Cath;  Surgeon: Jettie Booze, MD;  Location: [facility] CV LAB;  Service: Cardiovascular;  Laterality: N/A;  . CARDIAC CATHETERIZATION N/A 05/31/2016   Procedure: IABP Insertion;  Surgeon: Jettie Booze, MD;  Location: [place] CV LAB;  Service: Cardiovascular;  Laterality: N/A;  . CORONARY ARTERY BYPASS GRAFT N/A 06/05/2016   Procedure: CORONARY ARTERY BYPASS GRAFTING (CABG) x3 LIMA to LAD -SVG to OM -SVG to RCA;  Surgeon: Ivin Poot, MD;  Location: [facility];  Service: Open Heart Surgery;  Laterality: N/A;  . INSERTION OF DIALYSIS CATHETER N/A 06/05/2016   Procedure: INSERTION OF DIALYSIS/trialysis CATHETER;  Surgeon: Ivin Poot, MD;  Location: [facility];  Service: Vascular;  Laterality: N/A;  . INSERTION OF DIALYSIS CATHETER Right 06/13/2016   Procedure: INSERTION OF DIALYSIS CATHETER RIGHT INTERNAL JUGULAR;  Surgeon: Conrad Dubois, MD;  Location: [place];  Service: Vascular;  Laterality: Right;  . INTRAOPERATIVE TRANSESOPHAGEAL ECHOCARDIOGRAM N/A 06/05/2016   Procedure: INTRAOPERATIVE  TRANSESOPHAGEAL ECHOCARDIOGRAM;  Surgeon: Ivin Poot, MD;  Location: [facility];  Service: Open Heart Surgery;  Laterality: N/A;  . LEFT HEART CATH AND CORS/GRAFTS ANGIOGRAPHY N/A 11/03/2016   Procedure: Left Heart Cath and Cors/Grafts Angiography;  Surgeon: Troy Sine, MD;  Location: [place] CV LAB;  Service: Cardiovascular;  Laterality: N/A;       Home Medications    Prior to Admission medications   Medication Sig Start Date End Date Taking? Authorizing Provider  aspirin 81 MG chewable tablet Chew 1 tablet (81 mg total) by mouth daily. 11/06/16   Caren Griffins, MD  atorvastatin (LIPITOR) 80 MG tablet TAKE ONE TABLET BY MOUTH ONCE DAILY 03/31/17   Larey Dresser, MD  citalopram (CELEXA) 20 MG tablet Take 20 mg by mouth daily. 02/25/17   [provider]  clopidogrel (PLAVIX) 75 MG tablet Take 1 tablet (75 mg total) by mouth daily. Patient taking differently: Take 75 mg by mouth daily with lunch.  01/12/17   Bensimhon, Shaune Pascal, MD  ezetimibe (ZETIA) 10 MG tablet Take 1 tablet (10 mg total) by mouth daily. 02/02/17   Larey Dresser, MD  furosemide (LASIX) 80 MG tablet Take 80 mg by mouth 2 (two) times daily.    [provider]  gabapentin (NEURONTIN) 300 MG capsule Take 300 mg by mouth at bedtime. 02/11/17   [provider]  Insulin Human (INSULIN PUMP) SOLN Inject into the skin continuous. NOVOLOG    [provider]  isosorbide mononitrate (IMDUR) 30 MG 24 hr tablet Take 1 tablet (30 mg total) by mouth daily. 04/14/17   Hongalgi, Lenis Dickinson, MD  losartan (COZAAR) 50 MG tablet Take 1 tablet (50 mg total) by mouth daily. 04/14/17   Hongalgi, Lenis Dickinson, MD  metoprolol succinate (TOPROL-XL) 25 MG 24 hr tablet Take 1 tablet (25 mg total) by mouth at bedtime. 04/13/17   Hongalgi, Lenis Dickinson, MD  multivitamin (RENA-VIT) TABS tablet Take 1 tablet by mouth at bedtime. 04/13/17   Hongalgi, Lenis Dickinson, MD  pantoprazole (PROTONIX) 40 MG tablet Take 40 mg by mouth daily with  lunch. 01/07/17   [provider]  vitamin C (ASCORBIC ACID) 500 MG tablet Take 1,000 mg by mouth daily.    [provider]    Family History Family History  Problem Relation Age of Onset  . CAD Father   . Colon cancer Father   . Diabetes Brother     Social History Social History  Substance Use Topics  . Smoking status: Former Research scientist (life sciences)  . Smokeless tobacco: Never Used  . Alcohol use No     Allergies   Patient has no known allergies.   Review of Systems Review of Systems Level V caveat cannot obtain due to patient condition.  Physical Exam Updated Vital Signs BP (!) 163/67   Pulse 63   Temp 98.9 F (37.2 C) (Rectal)   Resp 17   Ht 5\' 6"  (1.676 m)   Wt 63 kg (139 lb)   SpO2 100%   BMI 22.44 kg/m   Physical Exam  Constitutional:  Patient appears very weak and is slightly pale.  No respiratory distress.  HENT:  Head: Normocephalic and atraumatic.  Mouth/Throat: Oropharynx is clear and moist.  Eyes: Pupils are equal, round, and reactive to light. EOM are normal.  Neck: Neck supple.  Cardiovascular: Normal rate, regular rhythm and normal heart sounds.   Pulmonary/Chest: Effort normal and breath sounds normal.  Patient has multiple surgical scars. All well-healed.  Abdominal: Soft. He exhibits no distension. There is no tenderness. There is no guarding.  Musculoskeletal: Normal range of motion. He exhibits no edema or tenderness.  No peripheral edema.  Neurological:  Patient seems very fatigued and intermittently slightly difficult to arouse. No focal neurologic deficit. He can spontaneously use all 4 extremities. He is speaking to his wife in his native tongue. Responses and in English are very limited.  Skin:  Skin is warm and slightly diaphoretic.     ED Treatments / Results  Labs (all labs ordered are listed, but only abnormal results are displayed) Labs Reviewed  CBC WITH DIFFERENTIAL/PLATELET - Abnormal; Notable for the following:        Result Value   RBC 3.42 (*)    Hemoglobin 10.7 (*)    HCT 32.7 (*)    All other components within normal limits  COMPREHENSIVE METABOLIC PANEL - Abnormal; Notable for the following:    Sodium 131 (*)    Potassium 5.6 (*)    Chloride 93 (*)    Glucose, Bld 326 (*)    BUN 36 (*)    Creatinine, Ser 5.87 (*)    Calcium 8.7 (*)    GFR calc non Af Amer 9 (*)    GFR calc Af Amer 11 (*)    All other components within normal limits  TROPONIN I - Abnormal; Notable for the following:    Troponin I 0.04 (*)    All other components within normal limits  CBG MONITORING, ED - Abnormal; Notable for the following:    Glucose-Capillary 321 (*)    All other components within normal limits  I-STAT CHEM 8, ED - Abnormal; Notable for the following:    Sodium 133 (*)    Potassium 5.5 (*)    Chloride 94 (*)    BUN 34 (*)    Creatinine, Ser 5.60 (*)    Glucose, Bld 322 (*)    Calcium, Ion 1.08 (*)    Hemoglobin 10.9 (*)    HCT 32.0 (*)    All other components within normal limits  URINALYSIS, ROUTINE W REFLEX MICROSCOPIC  RAPID URINE DRUG SCREEN, HOSP PERFORMED    EKG  EKG Interpretation  Date/Time:  Tuesday April 13 2017 21:19:23 EDT Ventricular Rate:  61 PR Interval:  192 QRS Duration: 108 QT Interval:  470 QTC Calculation: 473 R Axis:   91 Text Interpretation:  Normal sinus rhythm Possible Left atrial enlargement Rightward axis Cannot rule out Inferior infarct , age undetermined ST & T wave abnormality, consider lateral ischemia Abnormal ECG Confirmed by Charlesetta Shanks [phone]) on 04/13/2017 9:43:55 PM       Radiology Ct Head Wo Contrast  Result Date: 04/13/2017 CLINICAL DATA:  61 year old male with unresponsiveness and altered mental status. EXAM: CT HEAD WITHOUT CONTRAST TECHNIQUE: Contiguous axial images were obtained from the base of the skull through the vertex without intravenous contrast. COMPARISON:  Head CT dated 12/15/2016 FINDINGS: Brain: No evidence of acute infarction,  hemorrhage, hydrocephalus, extra-axial collection or mass lesion/mass effect. Vascular: No hyperdense vessel or unexpected calcification. Skull: Normal. Negative for fracture or focal lesion. Sinuses/Orbits: No acute finding.  Other: None. IMPRESSION: No acute intracranial pathology. Electronically Signed   By: Anner Crete M.D.   On: 04/13/2017 22:09   Dg Chest Port 1 View  Result Date: 04/13/2017 CLINICAL DATA:  61 year old male with chest pain. EXAM: PORTABLE CHEST 1 VIEW COMPARISON:  Chest radiograph dated 04/11/2017 FINDINGS: There is stable mild cardiomegaly with mild vascular congestion. No focal consolidation, pleural effusion, or pneumothorax. Atherosclerotic calcification of the aortic arch. Median sternotomy wires and CABG vascular clips. No acute osseous pathology. IMPRESSION: Stable mild cardiomegaly with mild vascular congestion. No focal consolidation. Electronically Signed   By: Anner Crete M.D.   On: 04/13/2017 22:04    Procedures Procedures (including critical care time)  Medications Ordered in ED Medications  nitroGLYCERIN (NITROGLYN) 2 % ointment 1 inch (1 inch Topical Given 04/13/17 2209)  morphine 4 MG/ML injection 4 mg (not administered)  acetaminophen (TYLENOL) tablet 1,000 mg (not administered)  hydrALAZINE (APRESOLINE) injection 10 mg (10 mg Intravenous Given 04/13/17 2148)  morphine 4 MG/ML injection 4 mg (4 mg Intravenous Given 04/13/17 2230)     Initial Impression / Assessment and Plan / ED Course  I have reviewed the triage vital signs and the nursing notes.  Pertinent labs & imaging results that were available during my care of the patient were reviewed by me and considered in my medical decision making (see chart for details).    Consult: Dr. Barbaraann Faster for admission Final Clinical Impressions(s) / ED Diagnoses   Final diagnoses:  Hypertensive emergency  Severe comorbid illness  Patient presents shortly after discharge from the hospital earlier today.  He does have the severe comorbid illness with advanced coronary artery disease and renal failure. Patient presented with somewhat waxing and waning mental status. There is no focal neurologic deficit. He does not show acute finding. Patient was hypertensive upon arrival at systolic blood pressure to 220. This is treated with hydralazine IV and nitroglycerin placed. Patient was treated with morphine for chest pain. Blood pressure has trended down appropriately. Patient's mental status has remained stable. Plan will be for observation in the hospital for symptoms of chest pain and waxing and waning mental status in a patient with severe comorbid illness.  New Prescriptions New Prescriptions   No medications on file     Charlesetta Shanks, MD 04/14/17 [phone]

## 2017-04-14 NOTE — ED Notes (Signed)
Pt calls out to report increasing headache. Per EDP verbal order for medication. Vitals stable

## 2017-04-14 NOTE — Progress Notes (Signed)
[name] Progress Note   Subjective:  Discharged from [facility] with chest pain felt not to be ACS on 7/31. Later that evening, developed recurrent CP and severe hypertension. Pain was substernal, radiating to posterior head and L shoulder region. Went to [facility] ED for evaluation. There, had syncopal episode and +/- AMS. Now transferred back to [facility] and readmitted.  Objective Vitals:   04/14/17 1000 04/14/17 1030 04/14/17 1150 04/14/17 1211  BP: 125/60 (!) 132/57  136/60  Pulse: (!) 53 (!) 52  (!) 53  Resp: 12 15    Temp:    97.7 F (36.5 C)  TempSrc:    Oral  SpO2: 98% 97%  95%  Weight:   64.5 kg (142 lb 3.2 oz)   Height:   5\' 6"  (1.676 m)    Physical Exam General: Well appearing, NAD. Heart: RRR; no murmur. Lungs: Faint rales LLL, otherwise clear. Abdomen: Soft, non-tender. Extremities: No LE edema. Dialysis Access: L AVF + thrill  Additional Objective Labs: Basic Metabolic Panel:  Recent Labs Lab 04/12/17 0219  04/13/17 2121 04/13/17 2128 04/14/17 0500 04/14/17 0551  NA 136  --  131* 133* 142 137  K 5.0  < > 5.6* 5.5* 3.2* 4.4  CL 98*  --  93* 94* 111 95*  CO2 27  --  26  --  23  --   GLUCOSE 68  --  326* 322* 60* 79  BUN 38*  --  36* 34* 30* 37*  CREATININE 6.06*  --  5.87* 5.60* 4.50* 6.00*  CALCIUM 8.8*  --  8.7*  --  6.4*  --   < > = values in this interval not displayed. Liver Function Tests:  Recent Labs Lab 04/11/17 1529 04/13/17 2121  AST 37 22  ALT 33 21  ALKPHOS 112 108  BILITOT 0.7 0.8  PROT 7.0 7.4  ALBUMIN 3.7 4.0    Recent Labs Lab 04/11/17 1529  LIPASE 94*   CBC:  Recent Labs Lab 04/11/17 1529 04/12/17 0219 04/13/17 2121 04/13/17 2128 04/14/17 0551  WBC 6.0 6.1 5.5  --   --   NEUTROABS 3.5  --  2.8  --   --   HGB 10.8* 9.7* 10.7* 10.9* 10.2*  HCT 33.0* 30.1* 32.7* 32.0* 30.0*  MCV 93.0 92.6 95.6  --   --   PLT 151 167 172  --   --    Cardiac Enzymes:  Recent Labs Lab 04/11/17 2007 04/12/17 0219  04/12/17 0722 04/13/17 2121 04/14/17 0500  TROPONINI 0.08* 0.08* 0.06* 0.04* <0.03   CBG:  Recent Labs Lab 04/14/17 0500 04/14/17 0539 04/14/17 0629 04/14/17 0754 04/14/17 1157  GLUCAP 81 85 117* 147* 223*   Studies/Results: Ct Head Wo Contrast  Result Date: 04/13/2017 CLINICAL DATA:  61 year old male with unresponsiveness and altered mental status. EXAM: CT HEAD WITHOUT CONTRAST TECHNIQUE: Contiguous axial images were obtained from the base of the skull through the vertex without intravenous contrast. COMPARISON:  Head CT dated 12/15/2016 FINDINGS: Brain: No evidence of acute infarction, hemorrhage, hydrocephalus, extra-axial collection or mass lesion/mass effect. Vascular: No hyperdense vessel or unexpected calcification. Skull: Normal. Negative for fracture or focal lesion. Sinuses/Orbits: No acute finding. Other: None. IMPRESSION: No acute intracranial pathology. Electronically Signed   By: Anner Crete M.D.   On: 04/13/2017 22:09   Dg Chest Port 1 View  Result Date: 04/13/2017 CLINICAL DATA:  61 year old male with chest pain. EXAM: PORTABLE CHEST 1 VIEW COMPARISON:  Chest radiograph dated 04/11/2017 FINDINGS:  There is stable mild cardiomegaly with mild vascular congestion. No focal consolidation, pleural effusion, or pneumothorax. Atherosclerotic calcification of the aortic arch. Median sternotomy wires and CABG vascular clips. No acute osseous pathology. IMPRESSION: Stable mild cardiomegaly with mild vascular congestion. No focal consolidation. Electronically Signed   By: Anner Crete M.D.   On: 04/13/2017 22:04   Medications:  . aspirin  81 mg Oral Daily  . atorvastatin  80 mg Oral q1800  . citalopram  20 mg Oral Daily  . [START ON 04/15/2017] clopidogrel  75 mg Oral Q lunch  . ezetimibe  10 mg Oral Daily  . furosemide  80 mg Oral BID  . gabapentin  300 mg Oral QHS  . heparin  5,000 Units Subcutaneous Q8H  . insulin aspart  0-5 Units Subcutaneous QHS  . insulin  aspart  0-9 Units Subcutaneous TID WC  . isosorbide mononitrate  30 mg Oral Daily  . losartan  50 mg Oral Daily  . metoprolol succinate  25 mg Oral QHS  . [START ON 04/15/2017] pantoprazole  40 mg Oral Q lunch    Dialysis Orders: MWF at [facility] 4hr, 2K/2.25Ca bath, BFR 400, DFR 800, EDW 63kg (just lowered), Heparin 4500 bolus - venofer 100mg  x 10 ordered - mircera 145mcg IV q 2 weeks - hectoral 28mcg IV q HD  Assessment/Plan: 1. Chest Pain: Hx 3 vessel CABG in 2017 and s/p coronary angiogram in February 2018 that showed extensive collateralization supplying the entire RCA up to the point of proximal RCA occlusion and left to left collaterals to the D1. Medical management was plan during last admit; meds optimized. Now readmitted with recurrent chest pain, although biggest area of pain currently is actually L trapezius muscle area. Last troponin negative. Per primary. 2. ESRD: Usually MWF schedule. Missed HD this morning due to being in the ED. Will dialyze him when we can, unfortunately, it may be tomorrow morning at this point given our full HD schedule. Last K/CO2 fine.  3. Anemia: Hgb 10.2. Will start ESA with next HD (Aranesp 65mcg ordered). 4. CKD-MBD: Discussed low phosphorus diet and compliance with phosphorus binders. Continue VDRA. 5. Nutrition: Continue renal diet and renal MVI 6. Hypertension: Encouraged compliance with antihypertensive therapy/low sodium diet and hemodialysis ultrafiltration-needs cautious lowering of dry weight.   Veneta Penton, PA-C 04/14/2017, 3:16 PM  [facility] Pager: [phone]

## 2017-04-14 NOTE — ED Notes (Addendum)
Per Unit Secretary, Bed Control advises that patient will hold in [facility] ED until tomorrow morning, and should receive a bed assignment after other inpatients are discharged. Charge RN and EDP Ward aware. Pt updated.

## 2017-04-14 NOTE — ED Notes (Signed)
Pt states his CBG reading of 81 is too low - pt has disconnected and removed his Insulin Pump - he is requesting apple juice and crackers since he is waiting on a bed at [facility] and will likely not have breakfast.

## 2017-04-14 NOTE — Progress Notes (Signed)
Dr. Marily Memos notified diabetes coordinator called RN stating pt needs a basal insulin ordered due to not being on home insulin pump while in the hospital. MD stated he's not going to order long acting insulin at this time due to dialysis scheduled. Per MD continue to monitor blood sugar levels as ordered & if sugars consistently run >300, may need to adjust fast acting dosage. Will continue to monitor.

## 2017-04-14 NOTE — Progress Notes (Signed)
RN, Loreli Slot, paged because pt was having chest pain and 12 lead computer reading said "acute MI". NP spoke with RN and ordered NTG x 3 and went to the bedside. S: Pt states he is still having CP. CP, when first started, was an 8/10. After 3 NTG, down to a 2/10. Pt states pain is located central sternum, in neck, and left arm. No n/v. Feels "hot". Feels a little SOB.  O: BP 190 upon arrival, down to 160s after NTG x 3. HR 60s, RRR. RR 18-22. No increased WOB. SaO2 normal on 2 L O2 per [place]. Pt is resting quietly with eyes closed. Arouses easily. Is very stoic. Card: S1 S2. Lungs CTA. Skin is pink, warm, dry. 12 lead EKGs reviewed. Do not agree with "acute MI", but there are questionable ST changes, but similar to previous EKGs.  A/P: 1. Chest pain in a gentleman with a strong cardiac hx including stents, CABG, and NSTEMI in 2016. LHC in 2/18. Pt is on ASA and Plavix daily. He is on a BB, ARB, Lasix, statin, Imdur.   NP spoke with Dr. Harrell Gave of cardiology. Discussed case and acute issue. She reviewed several EKGs and agree that his old EKGs appear very similar to tonight. He has changes associated with LVH but morphology/pattern is the same. Cardiology recommends no changes in tx plan at this time and they will see pt in am. NP to call back should pt continue to have CP or troponin be elevated. Pt has a hx of "flat troponins". Troponins here 0.03, 0.04 and 0.03 since admit. Troponin ordered stat now.   2. Accelerated HTN-CP began when BP 190. Continue meds and watch.  3. CHF-no evidence of overload on exam.  4. ESRD-HD in am.   Will follow. Labs in am.  KJKG, NP [facility]  Total critical care time: 40 minutes Critical care time was exclusive of separately billable procedures and treating other patients. Critical care was necessary to treat or prevent imminent or life-threatening deterioration. Critical care was time spent personally by me on the following activities: development of treatment plan with  patient and/or surrogate as well as nursing, discussions with consultants, evaluation of patient's response to treatment, examination of patient, obtaining history from patient or surrogate, ordering and performing treatments and interventions, ordering and review of laboratory studies, ordering and review of radiographic studies, pulse oximetry and re-evaluation of patient's condition.

## 2017-04-14 NOTE — ED Notes (Addendum)
Assumed care of patient from [place], [place]. Pt resting quietly. States he feels "much better" - denies pain, no distress. VSS. Normotensive. Cardiac monitoring in place. Pt states he cannot provide UA at this time, when asked, he states "I am on dialysis."

## 2017-04-14 NOTE — ED Notes (Signed)
Pt wife now advising she will take the insulin pump home.

## 2017-04-14 NOTE — H&P (Signed)
History and Physical    LAMONTE HARTT IWP:[id number] DOB: 1956-03-25 DOA: 04/13/2017  PCP: [name], MD Patient coming from: Home >>> [facility]  Chief Complaint: L shoulder pain. General malaise  HPI: GODRIC LAVELL is a 61 y.o. male with medical history significant of CHF, ESRD on dialysis, NSTEMI,   Of note patient was discharged on the same day that he return to the ED. At that time patient was discharged after being treated for acute on chronic systolic congestive heart failure and ongoing chest pain with mild troponin elevation. At that time it was determined that patient had persistent troponin leak and was not an ACS. Patient CHF exacerbation was relieved after providing with Lasix and close monitoring. Patient states that he left the hospital feeling nearly at his normal self when he had fairly rapid onset of general malaise and chest pain with radiation to the left shoulder. Patient took his blood pressure 3 times over the course of 15 minutes with systolic readings anywhere from 190-213. This concerned patient greatly and he went to [facility] for further evaluation. While at [facility] patient had a syncopal episode. Versive discrepancy with regards to when this occurred. Nursing notes state that it occurred while having his vitals checked. Patient states that he syncopized while ambulating shortly after getting his vitals checked and felt flushed and dizzy just prior to episode. Denies any loss of bowel or bladder function, focal neurological deficit, fever, headache, nausea, vomiting, shortness breath, palpitations.  ED Course: Patient given hydralazine for marked hypertension, nitroglycerin and morphine with resolution of chest pain and improvement of blood pressure.  Review of Systems: As per HPI otherwise all other systems reviewed and are negative  Ambulatory Status: No restrictions  Past Medical History:  Diagnosis Date  . Acute pulmonary edema ([place])  05/31/2016  . Acute respiratory failure with hypoxemia ([place]) 05/31/2016  . CHF (congestive heart failure) ([place])   . 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 ESRD (end stage renal disease) on dialysis [facility])    "MWF; [place]" (01/18/2017)  . HCAP (healthcare-associated pneumonia)   . Ischemic rest pain of lower extremity ([place]) 02/06/2015  . 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 NSTEMI (non-ST elevated myocardial infarction) ([place])   . Pain in lower limb 02/06/2015  . Pain in the chest   . Pleural effusion   . 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 Shortness of breath     Past Surgical History:  Procedure Laterality Date  . AV FISTULA PLACEMENT Left 06/22/2016   Procedure: ARTERIOVENOUS (AV) FISTULA CREATION LEFT UPPER ARM;  Surgeon: Rosetta Posner, MD;  Location: [place];  Service: Vascular;  Laterality: Left;  . CARDIAC CATHETERIZATION N/A 05/31/2016   Procedure: Left Heart Cath and Coronary Angiography;  Surgeon: Jettie Booze, MD;  Location: [place] [facility];  Service: Cardiovascular;  Laterality: N/A;  . CARDIAC CATHETERIZATION N/A 05/31/2016   Procedure: Right Heart Cath;  Surgeon: Jettie Booze, MD;  Location: [place] [facility];  Service: Cardiovascular;  Laterality: N/A;  . CARDIAC CATHETERIZATION N/A 05/31/2016   Procedure: IABP Insertion;  Surgeon: Jettie Booze, MD;  Location: [place] [facility];  Service: Cardiovascular;  Laterality: N/A;  . CORONARY ARTERY BYPASS GRAFT N/A 06/05/2016   Procedure: CORONARY ARTERY BYPASS GRAFTING (CABG) x3 LIMA to LAD -SVG to OM -SVG to RCA;  Surgeon: Ivin Poot, MD;  Location: [place];  Service: Open Heart  Surgery;  Laterality: N/A;  . INSERTION OF DIALYSIS CATHETER N/A 06/05/2016   Procedure: INSERTION OF DIALYSIS/trialysis CATHETER;  Surgeon: Ivin Poot, MD;  Location: [place];  Service: Vascular;  Laterality: N/A;  . INSERTION OF DIALYSIS CATHETER Right 06/13/2016   Procedure: INSERTION OF DIALYSIS  CATHETER RIGHT INTERNAL JUGULAR;  Surgeon: Conrad Rockham, MD;  Location: [place];  Service: Vascular;  Laterality: Right;  . INTRAOPERATIVE TRANSESOPHAGEAL ECHOCARDIOGRAM N/A 06/05/2016   Procedure: INTRAOPERATIVE TRANSESOPHAGEAL ECHOCARDIOGRAM;  Surgeon: Ivin Poot, MD;  Location: [place];  Service: Open Heart Surgery;  Laterality: N/A;  . LEFT HEART CATH AND CORS/GRAFTS ANGIOGRAPHY N/A 11/03/2016   Procedure: Left Heart Cath and Cors/Grafts Angiography;  Surgeon: Troy Sine, MD;  Location: [place] [facility];  Service: Cardiovascular;  Laterality: N/A;    Social History   Social History  . Marital status: Married    Spouse name: N/A  . Number of children: N/A  . Years of education: N/A   Occupational History  . Not on file.   Social History Main Topics  . Smoking status: Former Research scientist (life sciences)  . Smokeless tobacco: Never Used  . Alcohol use No  . Drug use: No  . Sexual activity: Not on file   Other Topics Concern  . Not on file   Social History Narrative  . No narrative on file    No Known Allergies  Family History  Problem Relation Age of Onset  . CAD Father   . Colon cancer Father   . Diabetes Brother       Prior to Admission medications   Medication Sig Start Date End Date Taking? Authorizing Provider  aspirin 81 MG chewable tablet Chew 1 tablet (81 mg total) by mouth daily. 11/06/16  Yes Caren Griffins, MD  atorvastatin (LIPITOR) 80 MG tablet TAKE ONE TABLET BY MOUTH ONCE DAILY Patient taking differently: TAKE 80 MG BY MOUTH ONCE DAILY 03/31/17  Yes Larey Dresser, MD  citalopram (CELEXA) 20 MG tablet Take 20 mg by mouth daily. 02/25/17  Yes [provider]  clopidogrel (PLAVIX) 75 MG tablet Take 1 tablet (75 mg total) by mouth daily. Patient taking differently: Take 75 mg by mouth daily with lunch.  01/12/17  Yes Bensimhon, Shaune Pascal, MD  ezetimibe (ZETIA) 10 MG tablet Take 1 tablet (10 mg total) by mouth daily. 02/02/17  Yes Larey Dresser, MD  furosemide  (LASIX) 80 MG tablet Take 80 mg by mouth 2 (two) times daily.   Yes [provider]  gabapentin (NEURONTIN) 300 MG capsule Take 300 mg by mouth at bedtime. 02/11/17  Yes [provider]  Insulin Human (INSULIN PUMP) SOLN Inject into the skin continuous. NOVOLOG   Yes [provider]  isosorbide mononitrate (IMDUR) 30 MG 24 hr tablet Take 1 tablet (30 mg total) by mouth daily. 04/14/17  Yes Hongalgi, Lenis Dickinson, MD  losartan (COZAAR) 50 MG tablet Take 1 tablet (50 mg total) by mouth daily. 04/14/17  Yes Hongalgi, Lenis Dickinson, MD  metoprolol succinate (TOPROL-XL) 25 MG 24 hr tablet Take 1 tablet (25 mg total) by mouth at bedtime. 04/13/17  Yes Hongalgi, Lenis Dickinson, MD  NOVOLOG 100 UNIT/ML injection 20-45 Units See admin instructions. Uses Novolog with Insulin Pump 20-45 units daily. 04/13/17  Yes [provider]  pantoprazole (PROTONIX) 40 MG tablet Take 40 mg by mouth daily with lunch. 01/07/17  Yes [provider]  vitamin C (ASCORBIC ACID) 500 MG tablet Take 1,000 mg by  mouth daily.   Yes [provider]  multivitamin (RENA-VIT) TABS tablet Take 1 tablet by mouth at bedtime. Patient not taking: Reported on 04/14/2017 04/13/17   Modena Jansky, MD    Physical Exam: Vitals:   04/14/17 1000 04/14/17 1030 04/14/17 1150 04/14/17 1211  BP: 125/60 (!) 132/57  136/60  Pulse: (!) 53 (!) 52  (!) 53  Resp: 12 15    Temp:    97.7 F (36.5 C)  TempSrc:    Oral  SpO2: 98% 97%  95%  Weight:   64.5 kg (142 lb 3.2 oz)   Height:   5\' 6"  (1.676 m)      General:  Appears calm and comfortable Eyes:  PERRL, EOMI, normal lids, iris ENT:  grossly normal hearing, lips & tongue, mmm Neck:  no LAD, masses or thyromegaly Cardiovascular:  RRR, III/VI systolic murmur. No LE edema.  Respiratory:  CTA bilaterally, no w/r/r. Normal respiratory effort. Abdomen:  soft, ntnd, NABS Skin:  no rash or induration seen on limited exam Musculoskeletal: Left shoulder region with  tenderness and stiffness along the superior posterior region of the trapezius muscle. Mild stiffness with range of motion in abduction and flexion. No bony abnormality is appreciated. Psychiatric:  grossly normal mood and affect, speech fluent and appropriate, AOx3 Neurologic:  CN 2-12 grossly intact, moves all extremities in coordinated fashion, sensation intact  Labs on Admission: I have personally reviewed following labs and imaging studies  CBC:  Recent Labs Lab 04/11/17 1529 04/12/17 0219 04/13/17 2121 04/13/17 2128 04/14/17 0551  WBC 6.0 6.1 5.5  --   --   NEUTROABS 3.5  --  2.8  --   --   HGB 10.8* 9.7* 10.7* 10.9* 10.2*  HCT 33.0* 30.1* 32.7* 32.0* 30.0*  MCV 93.0 92.6 95.6  --   --   PLT 151 167 172  --   --    Basic Metabolic Panel:  Recent Labs Lab 04/11/17 1529  04/12/17 0219  04/12/17 1518 04/13/17 2121 04/13/17 2128 04/14/17 0500 04/14/17 0551  NA 136  --  136  --   --  131* 133* 142 137  K 6.1*  < > 5.0  < > 4.8 5.6* 5.5* 3.2* 4.4  CL 98*  --  98*  --   --  93* 94* 111 95*  CO2 27  --  27  --   --  26  --  23  --   GLUCOSE 98  --  68  --   --  326* 322* 60* 79  BUN 33*  --  38*  --   --  36* 34* 30* 37*  CREATININE 5.56*  --  6.06*  --   --  5.87* 5.60* 4.50* 6.00*  CALCIUM 8.8*  --  8.8*  --   --  8.7*  --  6.4*  --   < > = values in this interval not displayed. GFR: Estimated Creatinine Clearance: 12 mL/min (A) (by C-G formula based on SCr of 6 mg/dL (H)). Liver Function Tests:  Recent Labs Lab 04/11/17 1529 04/13/17 2121  AST 37 22  ALT 33 21  ALKPHOS 112 108  BILITOT 0.7 0.8  PROT 7.0 7.4  ALBUMIN 3.7 4.0    Recent Labs Lab 04/11/17 1529  LIPASE 94*   No results for input(s): AMMONIA in the last 168 hours. Coagulation Profile: No results for input(s): INR, PROTIME in the last 168 hours. Cardiac Enzymes:  Recent Labs Lab 04/11/17 2007  04/12/17 0219 04/12/17 0722 04/13/17 2121 04/14/17 0500  TROPONINI 0.08* 0.08* 0.06* 0.04*  <0.03   BNP (last 3 results) No results for input(s): PROBNP in the last 8760 hours. HbA1C:  Recent Labs  04/12/17 0219  HGBA1C 5.8*   CBG:  Recent Labs Lab 04/14/17 0500 04/14/17 0539 04/14/17 0629 04/14/17 0754 04/14/17 1157  GLUCAP 81 85 117* 147* 223*   Lipid Profile: No results for input(s): CHOL, HDL, LDLCALC, TRIG, CHOLHDL, LDLDIRECT in the last 72 hours. Thyroid Function Tests: No results for input(s): TSH, T4TOTAL, FREET4, T3FREE, THYROIDAB in the last 72 hours. Anemia Panel: No results for input(s): VITAMINB12, FOLATE, FERRITIN, TIBC, IRON, RETICCTPCT in the last 72 hours. Urine analysis:    Component Value Date/Time   COLORURINE YELLOW 10/30/2016 2339   APPEARANCEUR CLEAR 10/30/2016 2339   LABSPEC 1.010 10/30/2016 2339   PHURINE 5.0 10/30/2016 2339   GLUCOSEU NEGATIVE 10/30/2016 2339   HGBUR NEGATIVE 10/30/2016 2339   BILIRUBINUR NEGATIVE 10/30/2016 2339   KETONESUR NEGATIVE 10/30/2016 2339   PROTEINUR 100 (A) 10/30/2016 2339   NITRITE NEGATIVE 10/30/2016 2339   LEUKOCYTESUR NEGATIVE 10/30/2016 2339    Creatinine Clearance: Estimated Creatinine Clearance: 12 mL/min (A) (by C-G formula based on SCr of 6 mg/dL (H)).  Sepsis Labs: @LABRCNTIP (procalcitonin:4,lacticidven:4) ) Recent Results (from the past 240 hour(s))  MRSA PCR Screening     Status: None   Collection Time: 04/12/17  2:42 AM  Result Value Ref Range Status   MRSA by PCR NEGATIVE NEGATIVE Final    Comment:        The GeneXpert MRSA Assay (FDA approved for NASAL specimens only), is one component of a comprehensive MRSA colonization surveillance program. It is not intended to diagnose MRSA infection nor to guide or monitor treatment for MRSA infections.      Radiological Exams on Admission: Ct Head Wo Contrast  Result Date: 04/13/2017 CLINICAL DATA:  61 year old male with unresponsiveness and altered mental status. EXAM: CT HEAD WITHOUT CONTRAST TECHNIQUE: Contiguous axial  images were obtained from the base of the skull through the vertex without intravenous contrast. COMPARISON:  Head CT dated 12/15/2016 FINDINGS: Brain: No evidence of acute infarction, hemorrhage, hydrocephalus, extra-axial collection or mass lesion/mass effect. Vascular: No hyperdense vessel or unexpected calcification. Skull: Normal. Negative for fracture or focal lesion. Sinuses/Orbits: No acute finding. Other: None. IMPRESSION: No acute intracranial pathology. Electronically Signed   By: Anner Crete M.D.   On: 04/13/2017 22:09   Dg Chest Port 1 View  Result Date: 04/13/2017 CLINICAL DATA:  61 year old male with chest pain. EXAM: PORTABLE CHEST 1 VIEW COMPARISON:  Chest radiograph dated 04/11/2017 FINDINGS: There is stable mild cardiomegaly with mild vascular congestion. No focal consolidation, pleural effusion, or pneumothorax. Atherosclerotic calcification of the aortic arch. Median sternotomy wires and CABG vascular clips. No acute osseous pathology. IMPRESSION: Stable mild cardiomegaly with mild vascular congestion. No focal consolidation. Electronically Signed   By: Anner Crete M.D.   On: 04/13/2017 22:04    EKG: Independently reviewed. Sinus. Widespread ST abnormalities consistent w/ previous ECG.   Assessment/Plan Active Problems:   S/P CABG x 3   ESRD (end stage renal disease) (HCC)   CAD (coronary artery disease)   Diabetes mellitus with complication (HCC)   Chest pain   Syncope   Chronic diastolic CHF (congestive heart failure) ([place])   Hypertensive emergency   Hypertensive emergency: Pt presented w/ marked hypertension and CP. SBP as high as 223. Unsure if pt taking medications accurately. Returned to baseline  after 10mg  Hydralazine and rest.  - Cotninue imdur, metop, losartan - hydralazine prn  SYncope: orthostatic vs vagal. Pt is very anxious in general. Doubt infectious, szr, metabolic, or arrhythmia though this is a possibliity.  - Tele - orthostatic VS  CP: h/o  CAD/CABG -questionable cardiac CP. May have simply been referred pain from trapezius strain or GI or stress induced. Trop negative. EKG unchanged. Pain was relieved w/ nitro and morphine - cycle trop, tele,  - SL nitro prn - Robaxin for muscle strain - GI cocktail - Continue lipitor, ASA, Plavix  GERD: - continue ppi  Chronic diastolic CHF: recent exacerbation. No evidence of fluid overload at this time.  - continue ARB, bblocker, Lasix  ESRD: MWF - nephrology aware and will dialyzed as needed  DM: - continue SSI     DVT prophylaxis: Hep  Code Status: full  Family Communication: none  Disposition Plan: pending improvement and workup  Consults called: renal for dialysis  Admission status: obs.     MERRELL, DAVID J MD [facility]  If 7PM-7AM, please contact night-coverage [email or web address] Password [email or web address]  04/14/2017, 3:03 PM

## 2017-04-14 NOTE — Progress Notes (Signed)
CRITICAL VALUE ALERT  Critical Value: troponin-0.04  Date & Time Notied: 04/14/17 @ 5041  Provider Notified: Dr. Marily Memos   Orders Received/Actions taken: MD notified via text page; will continue to monitor.

## 2017-04-15 ENCOUNTER — Encounter (HOSPITAL_BASED_OUTPATIENT_CLINIC_OR_DEPARTMENT_OTHER): Payer: Medicare Other | Attending: Internal Medicine

## 2017-04-15 DIAGNOSIS — E1142 Type 2 diabetes mellitus with diabetic polyneuropathy: Secondary | ICD-10-CM | POA: Insufficient documentation

## 2017-04-15 DIAGNOSIS — L97513 Non-pressure chronic ulcer of other part of right foot with necrosis of muscle: Secondary | ICD-10-CM | POA: Insufficient documentation

## 2017-04-15 DIAGNOSIS — E1122 Type 2 diabetes mellitus with diabetic chronic kidney disease: Secondary | ICD-10-CM | POA: Insufficient documentation

## 2017-04-15 DIAGNOSIS — I2510 Atherosclerotic heart disease of native coronary artery without angina pectoris: Secondary | ICD-10-CM | POA: Diagnosis present

## 2017-04-15 DIAGNOSIS — Z7902 Long term (current) use of antithrombotics/antiplatelets: Secondary | ICD-10-CM | POA: Diagnosis not present

## 2017-04-15 DIAGNOSIS — E11621 Type 2 diabetes mellitus with foot ulcer: Secondary | ICD-10-CM | POA: Insufficient documentation

## 2017-04-15 DIAGNOSIS — I132 Hypertensive heart and chronic kidney disease with heart failure and with stage 5 chronic kidney disease, or end stage renal disease: Secondary | ICD-10-CM | POA: Diagnosis present

## 2017-04-15 DIAGNOSIS — R55 Syncope and collapse: Secondary | ICD-10-CM | POA: Diagnosis not present

## 2017-04-15 DIAGNOSIS — F329 Major depressive disorder, single episode, unspecified: Secondary | ICD-10-CM | POA: Diagnosis present

## 2017-04-15 DIAGNOSIS — R072 Precordial pain: Secondary | ICD-10-CM | POA: Diagnosis not present

## 2017-04-15 DIAGNOSIS — E118 Type 2 diabetes mellitus with unspecified complications: Secondary | ICD-10-CM | POA: Diagnosis not present

## 2017-04-15 DIAGNOSIS — K219 Gastro-esophageal reflux disease without esophagitis: Secondary | ICD-10-CM | POA: Diagnosis present

## 2017-04-15 DIAGNOSIS — I2583 Coronary atherosclerosis due to lipid rich plaque: Secondary | ICD-10-CM | POA: Diagnosis not present

## 2017-04-15 DIAGNOSIS — I480 Paroxysmal atrial fibrillation: Secondary | ICD-10-CM | POA: Diagnosis present

## 2017-04-15 DIAGNOSIS — Z833 Family history of diabetes mellitus: Secondary | ICD-10-CM | POA: Diagnosis not present

## 2017-04-15 DIAGNOSIS — N186 End stage renal disease: Secondary | ICD-10-CM | POA: Insufficient documentation

## 2017-04-15 DIAGNOSIS — E1151 Type 2 diabetes mellitus with diabetic peripheral angiopathy without gangrene: Secondary | ICD-10-CM | POA: Insufficient documentation

## 2017-04-15 DIAGNOSIS — Z79899 Other long term (current) drug therapy: Secondary | ICD-10-CM | POA: Diagnosis not present

## 2017-04-15 DIAGNOSIS — I5032 Chronic diastolic (congestive) heart failure: Secondary | ICD-10-CM | POA: Diagnosis present

## 2017-04-15 DIAGNOSIS — Z7982 Long term (current) use of aspirin: Secondary | ICD-10-CM | POA: Diagnosis not present

## 2017-04-15 DIAGNOSIS — D638 Anemia in other chronic diseases classified elsewhere: Secondary | ICD-10-CM | POA: Diagnosis present

## 2017-04-15 DIAGNOSIS — Z87891 Personal history of nicotine dependence: Secondary | ICD-10-CM | POA: Diagnosis not present

## 2017-04-15 DIAGNOSIS — Z951 Presence of aortocoronary bypass graft: Secondary | ICD-10-CM | POA: Diagnosis not present

## 2017-04-15 DIAGNOSIS — I161 Hypertensive emergency: Secondary | ICD-10-CM | POA: Diagnosis present

## 2017-04-15 DIAGNOSIS — W458XXA Other foreign body or object entering through skin, initial encounter: Secondary | ICD-10-CM | POA: Insufficient documentation

## 2017-04-15 DIAGNOSIS — Z992 Dependence on renal dialysis: Secondary | ICD-10-CM | POA: Diagnosis not present

## 2017-04-15 DIAGNOSIS — R079 Chest pain, unspecified: Secondary | ICD-10-CM | POA: Diagnosis not present

## 2017-04-15 DIAGNOSIS — I252 Old myocardial infarction: Secondary | ICD-10-CM | POA: Insufficient documentation

## 2017-04-15 DIAGNOSIS — Z794 Long term (current) use of insulin: Secondary | ICD-10-CM | POA: Diagnosis not present

## 2017-04-15 DIAGNOSIS — R69 Illness, unspecified: Secondary | ICD-10-CM | POA: Diagnosis not present

## 2017-04-15 DIAGNOSIS — Z8249 Family history of ischemic heart disease and other diseases of the circulatory system: Secondary | ICD-10-CM | POA: Diagnosis not present

## 2017-04-15 DIAGNOSIS — S91104A Unspecified open wound of right lesser toe(s) without damage to nail, initial encounter: Secondary | ICD-10-CM | POA: Insufficient documentation

## 2017-04-15 DIAGNOSIS — E875 Hyperkalemia: Secondary | ICD-10-CM | POA: Diagnosis present

## 2017-04-15 LAB — CBC
HEMATOCRIT: 29.2 % — AB (ref 39.0–52.0)
HEMOGLOBIN: 9.4 g/dL — AB (ref 13.0–17.0)
MCH: 30.2 pg (ref 26.0–34.0)
MCHC: 32.2 g/dL (ref 30.0–36.0)
MCV: 93.9 fL (ref 78.0–100.0)
Platelets: 174 10*3/uL (ref 150–400)
RBC: 3.11 MIL/uL — ABNORMAL LOW (ref 4.22–5.81)
RDW: 15.6 % — AB (ref 11.5–15.5)
WBC: 5.7 10*3/uL (ref 4.0–10.5)

## 2017-04-15 LAB — COMPREHENSIVE METABOLIC PANEL
ALBUMIN: 3.4 g/dL — AB (ref 3.5–5.0)
ALK PHOS: 98 U/L (ref 38–126)
ALT: 17 U/L (ref 17–63)
ANION GAP: 10 (ref 5–15)
AST: 20 U/L (ref 15–41)
BILIRUBIN TOTAL: 0.7 mg/dL (ref 0.3–1.2)
BUN: 45 mg/dL — ABNORMAL HIGH (ref 6–20)
CALCIUM: 8.2 mg/dL — AB (ref 8.9–10.3)
CO2: 27 mmol/L (ref 22–32)
Chloride: 95 mmol/L — ABNORMAL LOW (ref 101–111)
Creatinine, Ser: 7.25 mg/dL — ABNORMAL HIGH (ref 0.61–1.24)
GFR, EST AFRICAN AMERICAN: 9 mL/min — AB (ref >60)
GFR, EST NON AFRICAN AMERICAN: 7 mL/min — AB (ref >60)
GLUCOSE: 195 mg/dL — AB (ref 65–99)
POTASSIUM: 5.4 mmol/L — AB (ref 3.5–5.1)
Sodium: 132 mmol/L — ABNORMAL LOW (ref 135–145)
TOTAL PROTEIN: 6.3 g/dL — AB (ref 6.5–8.1)

## 2017-04-15 LAB — MAGNESIUM: Magnesium: 2.5 mg/dL — ABNORMAL HIGH (ref 1.7–2.4)

## 2017-04-15 LAB — PHOSPHORUS: Phosphorus: 6.8 mg/dL — ABNORMAL HIGH (ref 2.5–4.6)

## 2017-04-15 LAB — TROPONIN I: TROPONIN I: 0.03 ng/mL — AB (ref <0.03)

## 2017-04-15 LAB — GLUCOSE, CAPILLARY
Glucose-Capillary: 133 mg/dL — ABNORMAL HIGH (ref 65–99)
Glucose-Capillary: 179 mg/dL — ABNORMAL HIGH (ref 65–99)
Glucose-Capillary: 181 mg/dL — ABNORMAL HIGH (ref 65–99)

## 2017-04-15 MED ORDER — LIDOCAINE HCL (PF) 1 % IJ SOLN: 5.0000 mL | INTRAMUSCULAR | Status: DC | PRN

## 2017-04-15 MED ORDER — SODIUM CHLORIDE 0.9 % IV SOLN: 100.0000 mL | INTRAVENOUS | Status: DC | PRN

## 2017-04-15 MED ORDER — INSULIN ASPART 100 UNIT/ML ~~LOC~~ SOLN
0.0000 [IU] | Freq: Three times a day (TID) | SUBCUTANEOUS | Status: DC
  Administered 2017-04-15: 2 [IU] via SUBCUTANEOUS
  Administered 2017-04-16: 3 [IU] via SUBCUTANEOUS

## 2017-04-15 MED ORDER — HEPARIN SODIUM (PORCINE) 1000 UNIT/ML DIALYSIS: 20.0000 [IU]/kg | INTRAMUSCULAR | Status: DC | PRN

## 2017-04-15 MED ORDER — DARBEPOETIN ALFA 60 MCG/0.3ML IJ SOSY
PREFILLED_SYRINGE | INTRAMUSCULAR | Status: AC
  Administered 2017-04-15: 60 ug via INTRAVENOUS
  Filled 2017-04-15: qty 0.3

## 2017-04-15 MED ORDER — LIDOCAINE-PRILOCAINE 2.5-2.5 % EX CREA: 1.0000 "application " | TOPICAL_CREAM | CUTANEOUS | Status: DC | PRN

## 2017-04-15 MED ORDER — DOXERCALCIFEROL 4 MCG/2ML IV SOLN
INTRAVENOUS | Status: AC
  Administered 2017-04-15: 1 ug
  Filled 2017-04-15: qty 2

## 2017-04-15 MED ORDER — PENTAFLUOROPROP-TETRAFLUOROETH EX AERO: 1.0000 "application " | INHALATION_SPRAY | CUTANEOUS | Status: DC | PRN

## 2017-04-15 NOTE — Progress Notes (Addendum)
[facility] Progress Note  Subjective: not feeling bad or good.  Describes how his BP went up the other day, at the time he was having CP and L shoulder pain.  No CP now.  I checked his orthostatics again and they were perfectly normal w/ standing P and BP of 55 and 138/65.    Vitals:   04/15/17 1030 04/15/17 1045 04/15/17 1056 04/15/17 1542  BP: 139/66 (!) 118/51 (!) 141/61 (!) 151/54  Pulse: 63 (!) 56 60 (!) 59  Resp:   19   Temp:   98.1 F (36.7 C)   TempSrc:   Oral   SpO2:   94%   Weight:   62.5 kg (137 lb 12.6 oz)   Height:        Inpatient medications: . aspirin  81 mg Oral Daily  . atorvastatin  80 mg Oral q1800  . citalopram  20 mg Oral Daily  . clopidogrel  75 mg Oral Q lunch  . darbepoetin (ARANESP) injection - DIALYSIS  60 mcg Intravenous Q Thu-HD  . [START ON 04/16/2017] doxercalciferol  1 mcg Intravenous Q M,W,F-HD  . ezetimibe  10 mg Oral Daily  . furosemide  80 mg Oral BID  . gabapentin  300 mg Oral QHS  . heparin  5,000 Units Subcutaneous Q8H  . insulin aspart  0-5 Units Subcutaneous QHS  . insulin aspart  0-9 Units Subcutaneous TID WC  . isosorbide mononitrate  30 mg Oral Daily  . losartan  50 mg Oral Daily  . metoprolol succinate  25 mg Oral QHS  . pantoprazole  40 mg Oral Q lunch    acetaminophen **OR** acetaminophen, gi cocktail, hydrALAZINE, morphine injection, nitroGLYCERIN, promethazine  Exam: NO distress No jvd Chest dec'd at base, o/w clear bilat RRR no sig MRG, sternotomy scar Abd soft ntnd no ascites Ext no pititng edema NF, ox 3  CXR 7/29/ 18  - mild IS edema, o/w neg Peak trop 0.08      Impression: 1. Chest pain / hx CABG x3 2017 - seen by cardiology, suspecting demand ischemia. Last cath 2018 w sig CAD and medical Rx recommended.   2. Syncope - per cards needs event monitor x 30d. Set up as OP per cards note. No driving x 6 mos.    3. HTN urgency - bp's much better today, on ARB/ BB  4. Chron diast HF - vol stable  today 5. ESRD on HD - hd MWF, missed yest so got HD today w 3Kg off. 6. Anemia of CKD - per renal  7. Dispo - probable dc tomorrow  Plan - as above   Kelly Splinter MD [facility] pgr [phone] 04/15/2017, 7:26 PM    Recent Labs Lab 04/13/17 2121  04/14/17 0500 04/14/17 0551 04/15/17 0317  NA 131*  < > 142 137 132*  K 5.6*  < > 3.2* 4.4 5.4*  CL 93*  < > 111 95* 95*  CO2 26  --  23  --  27  GLUCOSE 326*  < > 60* 79 195*  BUN 36*  < > 30* 37* 45*  CREATININE 5.87*  < > 4.50* 6.00* 7.25*  CALCIUM 8.7*  --  6.4*  --  8.2*  PHOS  --   --   --   --  6.8*  < > = values in this interval not displayed.  Recent Labs Lab 04/11/17 1529 04/13/17 2121 04/15/17 0317  AST 37 22 20  ALT 33 21  17  ALKPHOS 112 108 98  BILITOT 0.7 0.8 0.7  PROT 7.0 7.4 6.3*  ALBUMIN 3.7 4.0 3.4*    Recent Labs Lab 04/11/17 1529 04/12/17 0219 04/13/17 2121 04/13/17 2128 04/14/17 0551 04/15/17 0317  WBC 6.0 6.1 5.5  --   --  5.7  NEUTROABS 3.5  --  2.8  --   --   --   HGB 10.8* 9.7* 10.7* 10.9* 10.2* 9.4*  HCT 33.0* 30.1* 32.7* 32.0* 30.0* 29.2*  MCV 93.0 92.6 95.6  --   --  93.9  PLT 151 167 172  --   --  174   Iron/TIBC/Ferritin/ %Sat    Component Value Date/Time   IRON 30 (L) 10/31/2016 0846   TIBC 246 (L) 10/31/2016 0846   FERRITIN 491 (H) 10/31/2016 0846   IRONPCTSAT 12 (L) 10/31/2016 3154

## 2017-04-15 NOTE — Progress Notes (Signed)
Pt refused to urinate into urinal this AM. Pt with active orders for UA and UDS. Will continue to monitor.

## 2017-04-15 NOTE — Consult Note (Signed)
Advanced Heart Failure Team Consult Note   Primary Physician: Dr Tasia Catchings Primary Cardiologist: Dr Aundra Dubin    Reason for Consultation: Chest Pain   HPI:    Kenneth [name] is seen today for evaluation of chest pain at the request of Dr Judson Roch.   Kenneth [name] is a 61 year old with a history of DMII, ESRD, CAD, CABG x3 2017 ((LIMA-LAD, SVG-OM, SVG-RCA), HTN, and PAF admitted with chest pain and fatigue.   Multiple admits for HTN and CP. Most recent admit 7/29 with HTN/CP. BB and Imdur increased. Troponin flat and felt to be demand ischemia in the setting of HTN. Discharged On 7/31. Says he took all medications.  Yesterday he presented to [facility] with fatigue, chest pain,  and left shoulder pain. In the ED he had syncopal episode but was not on the monitor at the time it happened.  In the ED he was hypertensive (SBP over 200) and received NTG, morphine and hydralazine. Admitted to [facility] and receiving HD today. Pertinent admission labs included: K 3.2 Creatinine 4.5, calcium 4.5, troponin 0.03>0.04>0.03.   ECHO 12/2016 EF 55% RV normal Grade IDD.   LHC 11/03/2016   Mid LAD to Dist LAD lesion, 90 %stenosed.  Ost 2nd Diag to 2nd Diag lesion, 95 %stenosed.  Ramus lesion, 80 %stenosed.  Prox Cx lesion, 90 %stenosed.  Ost 1st Mrg to 1st Mrg lesion, 100 %stenosed.  Prox RCA lesion, 95 %stenosed.  Mid RCA lesion, 100 %stenosed.  SVG and is normal in caliber and anatomically normal.  LIMA and is normal in caliber and anatomically normal.  Dist LAD lesion, 20 %stenosed.  Graft was visualized by angiography and is normal in caliber and anatomically normal.  Origin to Prox Graft lesion, 100 %stenosed.  Ost LM to LM lesion, 40 %stenosed.  Review of Systems: [y] = yes, [ ]  = no   General: Weight gain [ ] ; Weight loss [ ] ; Anorexia [ ] ; Fatigue [ ] ; Fever [ ] ; Chills [ ] ; Weakness [ ]   Cardiac: Chest pain/pressure [Y ]; Resting SOB [ ] ; Exertional SOB [ ] ; Orthopnea [ ] ; Pedal Edema  [ ] ; Palpitations [ ] ; Syncope [Y ]; Presyncope [ ] ; Paroxysmal nocturnal dyspnea[ ]   Pulmonary: Cough [ ] ; Wheezing[ ] ; Hemoptysis[ ] ; Sputum [ ] ; Snoring [ ]   GI: Vomiting[ ] ; Dysphagia[ ] ; Melena[ ] ; Hematochezia [ ] ; Heartburn[ ] ; Abdominal pain [ ] ; Constipation [ ] ; Diarrhea [ ] ; BRBPR [ ]   GU: Hematuria[ ] ; Dysuria [ ] ; Nocturia[ ]   Vascular: Pain in legs with walking [ ] ; Pain in feet with lying flat [ ] ; Non-healing sores [ ] ; Stroke [ ] ; TIA [ ] ; Slurred speech [ ] ;  Neuro: Headaches[ ] ; Vertigo[ ] ; Seizures[ ] ; Paresthesias[ ] ;Blurred vision [ ] ; Diplopia [ ] ; Vision changes [ ]   Ortho/Skin: Arthritis [ ] ; Joint pain [Y  ]; Muscle pain [ ] ; Joint swelling [ ] ; Back Pain [ ] ; Rash [ ]   Psych: Depression[ ] ; Anxiety[ ]   Heme: Bleeding problems [ ] ; Clotting disorders [ ] ; Anemia [Y ]  Endocrine: Diabetes [ Y]; Thyroid dysfunction[ ]   Home Medications Prior to Admission medications   Medication Sig Start Date End Date Taking? Authorizing Provider  aspirin 81 MG chewable tablet Chew 1 tablet (81 mg total) by mouth daily. 11/06/16  Yes Caren Griffins, MD  atorvastatin (LIPITOR) 80 MG tablet TAKE ONE TABLET BY MOUTH ONCE DAILY Patient taking differently: TAKE 80 MG BY MOUTH ONCE  DAILY 03/31/17  Yes Larey Dresser, MD  citalopram (CELEXA) 20 MG tablet Take 20 mg by mouth daily. 02/25/17  Yes [provider]  clopidogrel (PLAVIX) 75 MG tablet Take 1 tablet (75 mg total) by mouth daily. Patient taking differently: Take 75 mg by mouth daily with lunch.  01/12/17  Yes Bensimhon, Shaune Pascal, MD  ezetimibe (ZETIA) 10 MG tablet Take 1 tablet (10 mg total) by mouth daily. 02/02/17  Yes Larey Dresser, MD  furosemide (LASIX) 80 MG tablet Take 80 mg by mouth 2 (two) times daily.   Yes [provider]  gabapentin (NEURONTIN) 300 MG capsule Take 300 mg by mouth at bedtime. 02/11/17  Yes [provider]  Insulin Human (INSULIN PUMP) SOLN Inject into the skin continuous. NOVOLOG    Yes [provider]  isosorbide mononitrate (IMDUR) 30 MG 24 hr tablet Take 1 tablet (30 mg total) by mouth daily. 04/14/17  Yes Hongalgi, Lenis Dickinson, MD  losartan (COZAAR) 50 MG tablet Take 1 tablet (50 mg total) by mouth daily. 04/14/17  Yes Hongalgi, Lenis Dickinson, MD  metoprolol succinate (TOPROL-XL) 25 MG 24 hr tablet Take 1 tablet (25 mg total) by mouth at bedtime. 04/13/17  Yes Hongalgi, Lenis Dickinson, MD  NOVOLOG 100 UNIT/ML injection 20-45 Units See admin instructions. Uses Novolog with Insulin Pump 20-45 units daily. 04/13/17  Yes [provider]  pantoprazole (PROTONIX) 40 MG tablet Take 40 mg by mouth daily with lunch. 01/07/17  Yes [provider]  vitamin C (ASCORBIC ACID) 500 MG tablet Take 1,000 mg by mouth daily.   Yes [provider]  multivitamin (RENA-VIT) TABS tablet Take 1 tablet by mouth at bedtime. Patient not taking: Reported on 04/14/2017 04/13/17   Modena Jansky, MD    Past Medical History: Past Medical History:  Diagnosis Date  . Acute pulmonary edema ([place]) 05/31/2016  . Acute respiratory failure with hypoxemia ([place]) 05/31/2016  . CHF (congestive heart failure) ([place])   . 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[place]) 02/06/2015  . ESRD (end stage renal disease) on dialysis [facility])    "MWF; [place]" (01/18/2017)  . HCAP (healthcare-associated pneumonia)   . Ischemic rest pain of lower extremity ([place]) 02/06/2015  . 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 NSTEMI (non-ST elevated myocardial infarction) ([place])   . Pain in lower limb 02/06/2015  . Pain in the chest   . Pleural effusion   . 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 Shortness of breath     Past Surgical History: Past Surgical History:  Procedure Laterality Date  . AV FISTULA PLACEMENT Left 06/22/2016   Procedure: ARTERIOVENOUS (AV) FISTULA CREATION LEFT UPPER ARM;  Surgeon: Rosetta Posner, MD;  Location: [facility];  Service: Vascular;  Laterality: Left;  . CARDIAC CATHETERIZATION N/A  05/31/2016   Procedure: Left Heart Cath and Coronary Angiography;  Surgeon: Jettie Booze, MD;  Location: [facility];  Service: Cardiovascular;  Laterality: N/A;  . CARDIAC CATHETERIZATION N/A 05/31/2016   Procedure: Right Heart Cath;  Surgeon: Jettie Booze, MD;  Location: [facility];  Service: Cardiovascular;  Laterality: N/A;  . CARDIAC CATHETERIZATION N/A 05/31/2016   Procedure: IABP Insertion;  Surgeon: Jettie Booze, MD;  Location: [facility];  Service: Cardiovascular;  Laterality: N/A;  . CORONARY ARTERY BYPASS GRAFT N/A 06/05/2016   Procedure: CORONARY ARTERY BYPASS GRAFTING (CABG) x3 LIMA to LAD -SVG to OM -SVG to RCA;  Surgeon: Ivin Poot, MD;  Location: [facility];  Service: Open Heart Surgery;  Laterality: N/A;  . INSERTION OF DIALYSIS CATHETER N/A 06/05/2016   Procedure: INSERTION OF DIALYSIS/trialysis CATHETER;  Surgeon: Ivin Poot, MD;  Location: [facility];  Service: Vascular;  Laterality: N/A;  . INSERTION OF DIALYSIS CATHETER Right 06/13/2016   Procedure: INSERTION OF DIALYSIS CATHETER RIGHT INTERNAL JUGULAR;  Surgeon: Conrad Mildred, MD;  Location: [facility];  Service: Vascular;  Laterality: Right;  . INTRAOPERATIVE TRANSESOPHAGEAL ECHOCARDIOGRAM N/A 06/05/2016   Procedure: INTRAOPERATIVE TRANSESOPHAGEAL ECHOCARDIOGRAM;  Surgeon: Ivin Poot, MD;  Location: [facility];  Service: Open Heart Surgery;  Laterality: N/A;  . LEFT HEART CATH AND CORS/GRAFTS ANGIOGRAPHY N/A 11/03/2016   Procedure: Left Heart Cath and Cors/Grafts Angiography;  Surgeon: Troy Sine, MD;  Location: [facility];  Service: Cardiovascular;  Laterality: N/A;    Family History: Family History  Problem Relation Age of Onset  . CAD Father   . Colon cancer Father   . Diabetes Brother     Social History: Social History   Social History  . Marital status: Married    Spouse name: N/A  . Number of children: N/A  . Years of education: N/A   Social History Main Topics   . Smoking status: Former Research scientist (life sciences)  . Smokeless tobacco: Never Used  . Alcohol use No  . Drug use: No  . Sexual activity: Not Asked   Other Topics Concern  . None   Social History Narrative  . None    Allergies:  No Known Allergies  Objective:    Vital Signs:   Temp:  [97.7 F (36.5 C)-97.9 F (36.6 C)] 97.9 F (36.6 C) (08/02 0650) Pulse Rate:  [50-63] 57 (08/02 1000) Resp:  [12-19] 13 (08/02 0655) BP: (128-191)/(48-72) 140/49 (08/02 1000) SpO2:  [93 %-98 %] 93 % (08/02 0650) Weight:  [142 lb 3.2 oz (64.5 kg)-145 lb 1 oz (65.8 kg)] 145 lb 1 oz (65.8 kg) (08/02 0650) Last BM Date: 04/12/17  Weight change: Filed Weights   04/13/17 2115 04/14/17 1150 04/15/17 0650  Weight: 139 lb (63 kg) 142 lb 3.2 oz (64.5 kg) 145 lb 1 oz (65.8 kg)    Intake/Output:   Intake/Output Summary (Last 24 hours) at 04/15/17 1013 Last data filed at 04/14/17 2334  Gross per 24 hour  Intake              660 ml  Output              250 ml  Net              410 ml      Physical Exam    General:  Well appearing. No resp difficulty HEENT: normal Neck: supple. JVP 9-10. Carotids 2+ bilat; no bruits. No lymphadenopathy or thyromegaly appreciated. Cor: PMI nondisplaced. Regular rate & rhythm. No rubs, gallops or murmurs. Lungs: clear Abdomen: soft, nontender, nondistended. No hepatosplenomegaly. No bruits or masses. Good bowel sounds. Extremities: no cyanosis, clubbing, rash, edema Neuro: alert & orientedx3, cranial nerves grossly intact. moves all 4 extremities w/o difficulty. Affect pleasant   Telemetry   Sinus [place] 50s   EKG    In the ED Sinus [facility] 50s   Labs   Basic Metabolic Panel:  Recent Labs Lab 04/11/17 1529  04/12/17 0219  04/13/17 2121 04/13/17 2128 04/14/17 0500 04/14/17 0551 04/15/17 0317  NA 136  --  136  --  131* 133* 142 137 132*  K 6.1*  < >  5.0  < > 5.6* 5.5* 3.2* 4.4 5.4*  CL 98*  --  98*  --  93* 94* 111 95* 95*  CO2 27  --  27  --  26  --  23  --   27  GLUCOSE 98  --  68  --  326* 322* 60* 79 195*  BUN 33*  --  38*  --  36* 34* 30* 37* 45*  CREATININE 5.56*  --  6.06*  --  5.87* 5.60* 4.50* 6.00* 7.25*  CALCIUM 8.8*  --  8.8*  --  8.7*  --  6.4*  --  8.2*  MG  --   --   --   --   --   --   --   --  2.5*  PHOS  --   --   --   --   --   --   --   --  6.8*  < > = values in this interval not displayed.  Liver Function Tests:  Recent Labs Lab 04/11/17 1529 04/13/17 2121 04/15/17 0317  AST 37 22 20  ALT 33 21 17  ALKPHOS 112 108 98  BILITOT 0.7 0.8 0.7  PROT 7.0 7.4 6.3*  ALBUMIN 3.7 4.0 3.4*    Recent Labs Lab 04/11/17 1529  LIPASE 94*   No results for input(s): AMMONIA in the last 168 hours.  CBC:  Recent Labs Lab 04/11/17 1529 04/12/17 0219 04/13/17 2121 04/13/17 2128 04/14/17 0551 04/15/17 0317  WBC 6.0 6.1 5.5  --   --  5.7  NEUTROABS 3.5  --  2.8  --   --   --   HGB 10.8* 9.7* 10.7* 10.9* 10.2* 9.4*  HCT 33.0* 30.1* 32.7* 32.0* 30.0* 29.2*  MCV 93.0 92.6 95.6  --   --  93.9  PLT 151 167 172  --   --  174    Cardiac Enzymes:  Recent Labs Lab 04/13/17 2121 04/14/17 0500 04/14/17 1503 04/14/17 1805 04/14/17 2332  TROPONINI 0.04* <0.03 0.04* 0.03* 0.03*    BNP: BNP (last 3 results)  Recent Labs  10/30/16 2024 12/15/16 2148 04/11/17 1529  BNP 1,900.0* 881.1* 1,724.5*    ProBNP (last 3 results) No results for input(s): PROBNP in the last 8760 hours.   CBG:  Recent Labs Lab 04/14/17 0754 04/14/17 1157 04/14/17 1709 04/14/17 2134 04/15/17 0659  GLUCAP 147* 223* 173* 176* 133*    Coagulation Studies: No results for input(s): LABPROT, INR in the last 72 hours.   Imaging    No results found.   Medications:     Current Medications: . aspirin  81 mg Oral Daily  . atorvastatin  80 mg Oral q1800  . citalopram  20 mg Oral Daily  . clopidogrel  75 mg Oral Q lunch  . darbepoetin (ARANESP) injection - DIALYSIS  60 mcg Intravenous Q Thu-HD  . [START ON 04/16/2017]  doxercalciferol  1 mcg Intravenous Q M,W,F-HD  . ezetimibe  10 mg Oral Daily  . furosemide  80 mg Oral BID  . gabapentin  300 mg Oral QHS  . heparin  5,000 Units Subcutaneous Q8H  . insulin aspart  0-5 Units Subcutaneous QHS  . insulin aspart  0-9 Units Subcutaneous TID WC  . isosorbide mononitrate  30 mg Oral Daily  . losartan  50 mg Oral Daily  . metoprolol succinate  25 mg Oral QHS  . pantoprazole  40 mg Oral Q lunch     Infusions: . sodium  chloride    . sodium chloride         Patient Profile   Kenneth [name] is 61 year old with h/o CAD S/P CABG 2017, HTN, DMII, and ESRD admitted with CP. Had [place] earlier this year with   Assessment/Plan  1. Chest Pain/CAD -->CABG x3 2017 ((LIMA-LAD, SVG-OM, SVG-RCA) Troponin trend flat. Suspect demand ischemia in the setting of HTN. [place] 2018 with significant CAD with recommendations for medical management.  Continue bb, imdur, statin.  2.Syncope- In the ED he was not on the monitor. will need 30 day event monitor. Will need to set up as an outpatient. No driving for 6 months.  3.  ESRD- missed hd yesterday. On HD today.  4. HTN Urgency- SBP >200. Better today. Continue current regimen. Could add amlodipine as needed. Defer to Nephrology.  5. Chronic Diastolic Heart- ECHO 12/5623 EF 55% RV normal  Grade I DD. Volume management per Nephrology.  6. DMII  HF follow up set up 04/21/2017 at 3:20. We will set up 30 day event monitor at that time.    Length of Stay: 0  Darrick Grinder, NP  04/15/2017, 10:13 AM  Advanced Heart Failure Team Pager [phone] (M-F; [place])  Please contact [facility] for night-coverage after hours (4p -7a ) and weekends on [email or web address]  Patient seen and examined with Darrick Grinder, NP. We discussed all aspects of the encounter. I agree with the assessment and plan as stated above.   Patient presents with recurrent CP and syncope in setting of severe HTN.  Recent cath (reviewed personally) with stable anatomy. Troponin negative  even in face of ESRD. Doubt this is cardiac. He reports syncope but is vague about symptoms. EF is normal. ECG stable. No events on tele. Will place 30-d event monitor. No driving x 6 months.   BP remains high. Can consider addition of amlodipine. Will defer BP management to Nephrology so as not to interfere with HD.  We will sign off. Please call with questions.   Glori Bickers, MD  11:33 AM

## 2017-04-15 NOTE — Procedures (Signed)
Patient seen on Hemodialysis. QB 400, UF goal 3.3L Treatment adjusted as needed.  Elmarie Shiley MD [facility]. Office # [phone] Pager # [phone] 9:05 AM

## 2017-04-15 NOTE — Progress Notes (Signed)
Patient ID: Kenneth [name], male   DOB: [birth year], 61 y.o.   MRN: [id number]  [facility] Progress Note   Assessment/ Plan:   1. Chest Pain: Hx 3 vessel CABG in 2017 and s/p coronary angiogram in February 2018 that showed extensive collateralization supplying the entire RCA up to the point of proximal RCA occlusion and left to left collaterals to the D1. Unlikely ACS based on distribution of chest pain- continue medical management.  2. ESRD: Usually MWF schedule. Missed HD yesterday and getting dialysis today.  3. Anemia: Hgb 10.2. Will start ESA with next HD (Aranesp 58mcg ordered). 4. CKD-MBD: Discussed low phosphorus diet and compliance with phosphorus binders. Continue VDRA. 5. Nutrition: Continue renal diet and renal MVI 6. Hypertension:Encouraged compliance with antihypertensive therapy/low sodium diet and hemodialysis ultrafiltration-needs slow decrease of EDW.   Subjective:   Reports continued pain in upper left chest and trapezius area.    Objective:   BP (!) 157/56   Pulse (!) 56   Temp 97.9 F (36.6 C) (Oral)   Resp 13   Ht 5\' 6"  (1.676 m)   Wt 65.8 kg (145 lb 1 oz)   SpO2 93%   BMI 23.41 kg/m   Physical Exam: LHT:[id number] resting on dialysis. WIO:MBTDH RRR, Normal S1 and S2 Resp:CTA bilaterally, no rales/rhonchi RCB:ULAG, flat, non-tender, BS normal Ext:No LE edema.  Labs: BMET  Recent Labs Lab 04/11/17 1529  04/12/17 0219  04/12/17 1247 04/12/17 1518 04/13/17 2121 04/13/17 2128 04/14/17 0500 04/14/17 0551 04/15/17 0317  NA 136  --  136  --   --   --  131* 133* 142 137 132*  K 6.1*  < > 5.0  < > 4.1 4.8 5.6* 5.5* 3.2* 4.4 5.4*  CL 98*  --  98*  --   --   --  93* 94* 111 95* 95*  CO2 27  --  27  --   --   --  26  --  23  --  27  GLUCOSE 98  --  68  --   --   --  326* 322* 60* 79 195*  BUN 33*  --  38*  --   --   --  36* 34* 30* 37* 45*  CREATININE 5.56*  --  6.06*  --   --   --  5.87* 5.60* 4.50* 6.00* 7.25*  CALCIUM 8.8*  --  8.8*   --   --   --  8.7*  --  6.4*  --  8.2*  PHOS  --   --   --   --   --   --   --   --   --   --  6.8*  < > = values in this interval not displayed. CBC  Recent Labs Lab 04/11/17 1529 04/12/17 0219 04/13/17 2121 04/13/17 2128 04/14/17 0551 04/15/17 0317  WBC 6.0 6.1 5.5  --   --  5.7  NEUTROABS 3.5  --  2.8  --   --   --   HGB 10.8* 9.7* 10.7* 10.9* 10.2* 9.4*  HCT 33.0* 30.1* 32.7* 32.0* 30.0* 29.2*  MCV 93.0 92.6 95.6  --   --  93.9  PLT 151 167 172  --   --  174   Medications:    . aspirin  81 mg Oral Daily  . atorvastatin  80 mg Oral q1800  . citalopram  20 mg Oral Daily  . clopidogrel  75 mg Oral Q lunch  .  darbepoetin (ARANESP) injection - DIALYSIS  60 mcg Intravenous Q Thu-HD  . [START ON 04/16/2017] doxercalciferol  1 mcg Intravenous Q M,W,F-HD  . ezetimibe  10 mg Oral Daily  . furosemide  80 mg Oral BID  . gabapentin  300 mg Oral QHS  . heparin  5,000 Units Subcutaneous Q8H  . insulin aspart  0-5 Units Subcutaneous QHS  . insulin aspart  0-9 Units Subcutaneous TID WC  . isosorbide mononitrate  30 mg Oral Daily  . losartan  50 mg Oral Daily  . metoprolol succinate  25 mg Oral QHS  . pantoprazole  40 mg Oral Q lunch   Elmarie Shiley, MD 04/15/2017, 8:56 AM

## 2017-04-15 NOTE — Consult Note (Signed)
WOC consulted for right foot wounds, however it is noted that [facility] consulted on Monday 04/13/17 for this same problems, see wound care orders.   Re consult if needed, will not follow at this time. Thanks  Braylinn Gulden [employer], RN,CWOCN, CNS, [place] [phone])

## 2017-04-16 DIAGNOSIS — R55 Syncope and collapse: Secondary | ICD-10-CM

## 2017-04-16 DIAGNOSIS — R69 Illness, unspecified: Secondary | ICD-10-CM

## 2017-04-16 DIAGNOSIS — I2510 Atherosclerotic heart disease of native coronary artery without angina pectoris: Secondary | ICD-10-CM

## 2017-04-16 DIAGNOSIS — I2583 Coronary atherosclerosis due to lipid rich plaque: Secondary | ICD-10-CM

## 2017-04-16 DIAGNOSIS — I161 Hypertensive emergency: Principal | ICD-10-CM

## 2017-04-16 DIAGNOSIS — Z951 Presence of aortocoronary bypass graft: Secondary | ICD-10-CM

## 2017-04-16 DIAGNOSIS — R079 Chest pain, unspecified: Secondary | ICD-10-CM

## 2017-04-16 DIAGNOSIS — N186 End stage renal disease: Secondary | ICD-10-CM

## 2017-04-16 DIAGNOSIS — I5032 Chronic diastolic (congestive) heart failure: Secondary | ICD-10-CM

## 2017-04-16 DIAGNOSIS — E118 Type 2 diabetes mellitus with unspecified complications: Secondary | ICD-10-CM

## 2017-04-16 LAB — CBC
HEMATOCRIT: 30.6 % — AB (ref 39.0–52.0)
Hemoglobin: 10.2 g/dL — ABNORMAL LOW (ref 13.0–17.0)
MCH: 31.1 pg (ref 26.0–34.0)
MCHC: 33.3 g/dL (ref 30.0–36.0)
MCV: 93.3 fL (ref 78.0–100.0)
Platelets: 143 10*3/uL — ABNORMAL LOW (ref 150–400)
RBC: 3.28 MIL/uL — ABNORMAL LOW (ref 4.22–5.81)
RDW: 16.2 % — AB (ref 11.5–15.5)
WBC: 6.8 10*3/uL (ref 4.0–10.5)

## 2017-04-16 LAB — RENAL FUNCTION PANEL
ALBUMIN: 3.6 g/dL (ref 3.5–5.0)
Anion gap: 11 (ref 5–15)
BUN: 30 mg/dL — AB (ref 6–20)
CO2: 26 mmol/L (ref 22–32)
Calcium: 8.6 mg/dL — ABNORMAL LOW (ref 8.9–10.3)
Chloride: 94 mmol/L — ABNORMAL LOW (ref 101–111)
Creatinine, Ser: 5.58 mg/dL — ABNORMAL HIGH (ref 0.61–1.24)
GFR calc Af Amer: 12 mL/min — ABNORMAL LOW (ref >60)
GFR calc non Af Amer: 10 mL/min — ABNORMAL LOW (ref >60)
GLUCOSE: 123 mg/dL — AB (ref 65–99)
PHOSPHORUS: 5.3 mg/dL — AB (ref 2.5–4.6)
POTASSIUM: 4.4 mmol/L (ref 3.5–5.1)
Sodium: 131 mmol/L — ABNORMAL LOW (ref 135–145)

## 2017-04-16 LAB — GLUCOSE, CAPILLARY
GLUCOSE-CAPILLARY: 250 mg/dL — AB (ref 65–99)
Glucose-Capillary: 219 mg/dL — ABNORMAL HIGH (ref 65–99)

## 2017-04-16 MED ORDER — HEPARIN SODIUM (PORCINE) 1000 UNIT/ML DIALYSIS: 20.0000 [IU]/kg | INTRAMUSCULAR | Status: DC | PRN

## 2017-04-16 MED ORDER — SODIUM CHLORIDE 0.9 % IV SOLN: 100.0000 mL | INTRAVENOUS | Status: DC | PRN

## 2017-04-16 MED ORDER — ALTEPLASE 2 MG IJ SOLR: 2.0000 mg | Freq: Once | INTRAMUSCULAR | Status: DC | PRN

## 2017-04-16 MED ORDER — HEPARIN SODIUM (PORCINE) 1000 UNIT/ML DIALYSIS
40.0000 [IU]/kg | INTRAMUSCULAR | Status: DC | PRN
  Filled 2017-04-16: qty 3

## 2017-04-16 MED ORDER — PENTAFLUOROPROP-TETRAFLUOROETH EX AERO: 1.0000 "application " | INHALATION_SPRAY | CUTANEOUS | Status: DC | PRN

## 2017-04-16 MED ORDER — LIDOCAINE HCL (PF) 1 % IJ SOLN: 5.0000 mL | INTRAMUSCULAR | Status: DC | PRN

## 2017-04-16 MED ORDER — LIDOCAINE-PRILOCAINE 2.5-2.5 % EX CREA: 1.0000 "application " | TOPICAL_CREAM | CUTANEOUS | Status: DC | PRN

## 2017-04-16 MED ORDER — HEPARIN SODIUM (PORCINE) 1000 UNIT/ML DIALYSIS: 1000.0000 [IU] | INTRAMUSCULAR | Status: DC | PRN

## 2017-04-16 MED ORDER — DOXERCALCIFEROL 4 MCG/2ML IV SOLN
INTRAVENOUS | Status: AC
  Filled 2017-04-16: qty 2

## 2017-04-16 NOTE — Progress Notes (Signed)
Patient ID: Kenneth [name], male   DOB: 1956-03-26, 61 y.o.   MRN: [id number]  [facility] Progress Note   Assessment/ Plan:   1. Left upper chest/trapezius pain: Hx 3 vessel CABG in 2017 and s/p coronary angiogram in February 2018 that showed extensive collateralization supplying the entire RCA up to the point of proximal RCA occlusion and left to left collaterals to the D1. Unlikely ACS based on distribution of chest pain- continue medical management.  2. ESRD: Usually MWF schedule. Extra HD done yesterday after missed it on Wednesday- plan for HD today prior to DC. Makes <362mL urine per day--will DC Lasix.  3. Anemia: Hgb 10.2. Will start ESA with next HD (Aranesp 67mcg ordered). 4. CKD-MBD: Discussed low phosphorus diet and compliance with phosphorus binders. Continue VDRA. 5. Nutrition: Continue renal diet and renal MVI 6. Malignant hypertension:Encouraged compliance with antihypertensive therapy/low sodium diet and will continue to decrease EDW as tolerated.   Subjective:   Reports to be feeling better- wife at bedside. Inquires about safety of upcoming cataract surgery next Thursday.    Objective:   BP (!) 140/55 (BP Location: Right Arm)   Pulse 63   Temp 98.5 F (36.9 C) (Oral)   Resp 17   Ht 5\' 6"  (1.676 m)   Wt 62.8 kg (138 lb 6.4 oz)   SpO2 94%   BMI 22.34 kg/m   Physical Exam: WJX:[id number] resting in recliner HYQ:MVHQI RRR, Normal S1 and S2 Resp:CTA bilaterally, no rales/rhonchi ONG:EXBM, flat, non-tender, BS normal Ext:No LE edema.  Labs: BMET  Recent Labs Lab 04/11/17 1529  04/12/17 0219  04/12/17 1247 04/12/17 1518 04/13/17 2121 04/13/17 2128 04/14/17 0500 04/14/17 0551 04/15/17 0317  NA 136  --  136  --   --   --  131* 133* 142 137 132*  K 6.1*  < > 5.0  < > 4.1 4.8 5.6* 5.5* 3.2* 4.4 5.4*  CL 98*  --  98*  --   --   --  93* 94* 111 95* 95*  CO2 27  --  27  --   --   --  26  --  23  --  27  GLUCOSE 98  --  68  --   --   --  326*  322* 60* 79 195*  BUN 33*  --  38*  --   --   --  36* 34* 30* 37* 45*  CREATININE 5.56*  --  6.06*  --   --   --  5.87* 5.60* 4.50* 6.00* 7.25*  CALCIUM 8.8*  --  8.8*  --   --   --  8.7*  --  6.4*  --  8.2*  PHOS  --   --   --   --   --   --   --   --   --   --  6.8*  < > = values in this interval not displayed. CBC  Recent Labs Lab 04/11/17 1529 04/12/17 0219 04/13/17 2121 04/13/17 2128 04/14/17 0551 04/15/17 0317  WBC 6.0 6.1 5.5  --   --  5.7  NEUTROABS 3.5  --  2.8  --   --   --   HGB 10.8* 9.7* 10.7* 10.9* 10.2* 9.4*  HCT 33.0* 30.1* 32.7* 32.0* 30.0* 29.2*  MCV 93.0 92.6 95.6  --   --  93.9  PLT 151 167 172  --   --  174   Medications:    . aspirin  81 mg Oral Daily  . atorvastatin  80 mg Oral q1800  . citalopram  20 mg Oral Daily  . clopidogrel  75 mg Oral Q lunch  . darbepoetin (ARANESP) injection - DIALYSIS  60 mcg Intravenous Q Thu-HD  . doxercalciferol  1 mcg Intravenous Q M,W,F-HD  . ezetimibe  10 mg Oral Daily  . gabapentin  300 mg Oral QHS  . heparin  5,000 Units Subcutaneous Q8H  . insulin aspart  0-5 Units Subcutaneous QHS  . insulin aspart  0-9 Units Subcutaneous TID WC  . isosorbide mononitrate  30 mg Oral Daily  . losartan  50 mg Oral Daily  . metoprolol succinate  25 mg Oral QHS  . pantoprazole  40 mg Oral Q lunch   Elmarie Shiley, MD 04/16/2017, 11:26 AM

## 2017-04-16 NOTE — Progress Notes (Signed)
Results for TAYRON, HUNNELL (MRN [id number]) as of 04/16/2017 09:04  Ref. Range 04/14/2017 21:34 04/15/2017 06:59 04/15/2017 16:24 04/15/2017 21:47 04/16/2017 07:38  Glucose-Capillary Latest Ref Range: 65 - 99 mg/dL 176 (H) 133 (H) 179 (H) 181 (H) 250 (H)  Noted that fasting blood sugar was 250 mg/dl.  Patient is normally on an insulin pump that delivers a total of 21.6 units of basal insulin. Recommend starting Levemir 10 units daily and can titrate dosage as needed. May be able to start insulin pump back before discharge.   Harvel Ricks RN BSN CDE Diabetes Coordinator Pager: [phone]  8am-5pm

## 2017-04-16 NOTE — Procedures (Signed)
Patient seen on Hemodialysis. QB 400, UF goal 3L Treatment adjusted as needed.  Elmarie Shiley MD [facility] Office # [phone] Pager # [phone] 1:46 PM

## 2017-04-16 NOTE — Discharge Summary (Addendum)
Physician Discharge Summary  Kenneth [name] VPX:[id number] DOB: Jul 01, 1956 DOA: 04/13/2017  PCP: [name], MD  Admit date: 04/13/2017 Discharge date: 04/16/2017  Time spent: 45 minutes  Recommendations for Outpatient Follow-up:  Patient will be discharged to home.  Patient will need to follow up with primary care provider within one week of discharge.  Patient should continue medications as prescribed.  Continue dialysis as scheduled Patient should follow a renal/carb modified diet. Cardiology will contact you for 30 day event monitor. Do not drive.  Discharge Diagnoses:  Chest pain Syncope Hypertensive urgency/Essential Hypertension Chronic diastolic heart failure End-stage renal disease Anemia of chronic disease Hyperkalemia  Diabetes mellitus, type II with neuropathy Depression GERD  Discharge Condition: Stable  Diet recommendation: Renal/carb modified  Filed Weights   04/15/17 1056 04/16/17 0601 04/16/17 1330  Weight: 62.5 kg (137 lb 12.6 oz) 62.8 kg (138 lb 6.4 oz) 63.7 kg (140 lb 6.9 oz)    History of present illness:  On 04/14/2017 by Dr. Linna Darner Kenneth [name] is a 61 y.o. male with medical history significant of CHF, ESRD on dialysis, NSTEMI,  Of note patient was discharged on the same day that he return to the ED. At that time patient was discharged after being treated for acute on chronic systolic congestive heart failure and ongoing chest pain with mild troponin elevation. At that time it was determined that patient had persistent troponin leak and was not an ACS. Patient CHF exacerbation was relieved after providing with Lasix and close monitoring. Patient states that he left the hospital feeling nearly at his normal self when he had fairly rapid onset of general malaise and chest pain with radiation to the left shoulder. Patient took his blood pressure 3 times over the course of 15 minutes with systolic readings anywhere from 190-213. This concerned patient  greatly and he went to [facility] for further evaluation. While at [facility] patient had a syncopal episode. Versive discrepancy with regards to when this occurred. Nursing notes state that it occurred while having his vitals checked. Patient states that he syncopized while ambulating shortly after getting his vitals checked and felt flushed and dizzy just prior to episode. Denies any loss of bowel or bladder function, focal neurological deficit, fever, headache, nausea, vomiting, shortness breath, palpitations.  Hospital Course:  Chest pain -With history of CABG in 2017 -Last cardiac catheterization in 2018 showed significant coronary artery disease, medical therapy recommended -Cardiology consulted and appreciated, recommended event monitor and possibly addition of amlodipine if blood pressure continues to be uncontrolled -Troponin cycled- 0.04, 0.03, 0.03 (in the setting of ESRD), EKG appears stable -Continue aspirin, plavix, statin, metoprolol, imdur    Syncope -As above, cardiology consulted and appreciated -Patient will need 30 day monitor, which will be arranged by Cardiology -No driving for 6 months  Hypertensive urgency/Essential Hypertension -Blood pressures much improved -Continue metoprolol, losartan   Chronic diastolic heart failure -Currently appears to be euvolemic -Echocardiogram April 2018 showed EF of 55%, with normal RV. Grade 1 diastolic dysfunction -Continue dialysis for volume control -discussed lasix with nephrology, will discontinue   End-stage renal disease -Patient dialyzes Monday, Wednesday, Friday -Missed his Wednesday dialysis, dialyzed on 04/15/2017 -Nephrology consulted and appreciated -Patient will dialyze today and can then resume his normal outpatient schedule  Anemia of chronic disease -baseline hemoglobin around 8-11, but has been as high as 15 -Currently 10.2 -was given aranesp  Hyperkalemia  -possible secondary to missed  HD -resolved  Diabetes mellitus, type II with neuropathy -Continue insulin pump on discharge -Continue gabapentin for neuropathy  Depression -Continue Celexa  GERD -Continue PPI  Procedures: Hemodialysis   Consultations: Nephrology Cardiology   Discharge Exam: Vitals:   04/16/17 1346 04/16/17 1400  BP: (!) 107/53 (!) 122/51  Pulse: (!) 53 (!) 50  Resp: 18 17  Temp:     Patient feeling better this morning. Currently denies any chest pain, shortness of breath, abdominal pain, nausea or vomiting, diarrhea or constipation, dizziness or headache.    General: Well developed, well nourished, NAD, appears stated age  HEENT: NCAT, mucous membranes moist.  Cardiovascular: S1 S2 auscultated, no rubs, murmurs or gallops. Regular rate and rhythm.  Respiratory: Clear to auscultation bilaterally with equal chest rise  Abdomen: Soft, nontender, nondistended, + bowel sounds  Extremities: warm dry without cyanosis clubbing or edema  Neuro: AAOx3, nonfocal  Psych: Pleasant, appropriate mood and affect  Discharge Instructions Discharge Instructions    Discharge instructions    Complete by:  As directed    Patient will be discharged to home.  Patient will need to follow up with primary care provider within one week of discharge.  Patient should continue medications as prescribed.  Continue dialysis as scheduled Patient should follow a renal/carb modified diet. Cardiology will contact you for 30 day event monitor. Do not drive.     Current Discharge Medication List    CONTINUE these medications which have NOT CHANGED   Details  aspirin 81 MG chewable tablet Chew 1 tablet (81 mg total) by mouth daily. Qty: 30 tablet, Refills: 1    atorvastatin (LIPITOR) 80 MG tablet TAKE ONE TABLET BY MOUTH ONCE DAILY Qty: 30 tablet, Refills: 3    citalopram (CELEXA) 20 MG tablet Take 20 mg by mouth daily. Refills: 1    clopidogrel (PLAVIX) 75 MG tablet Take 1 tablet (75 mg total) by  mouth daily. Qty: 90 tablet, Refills: 2    ezetimibe (ZETIA) 10 MG tablet Take 1 tablet (10 mg total) by mouth daily. Qty: 30 tablet, Refills: 3    gabapentin (NEURONTIN) 300 MG capsule Take 300 mg by mouth at bedtime. Refills: 0    Insulin Human (INSULIN PUMP) SOLN Inject into the skin continuous. NOVOLOG    isosorbide mononitrate (IMDUR) 30 MG 24 hr tablet Take 1 tablet (30 mg total) by mouth daily. Qty: 30 tablet, Refills: 0    losartan (COZAAR) 50 MG tablet Take 1 tablet (50 mg total) by mouth daily. Qty: 30 tablet, Refills: 0    metoprolol succinate (TOPROL-XL) 25 MG 24 hr tablet Take 1 tablet (25 mg total) by mouth at bedtime. Qty: 30 tablet, Refills: 0    NOVOLOG 100 UNIT/ML injection 20-45 Units See admin instructions. Uses Novolog with Insulin Pump 20-45 units daily. Refills: 2    pantoprazole (PROTONIX) 40 MG tablet Take 40 mg by mouth daily with lunch. Refills: 0    vitamin C (ASCORBIC ACID) 500 MG tablet Take 1,000 mg by mouth daily.      STOP taking these medications     furosemide (LASIX) 80 MG tablet      multivitamin (RENA-VIT) TABS tablet        No Known Allergies Follow-up Information    Larey Dresser, MD Follow up on 04/21/2017.   Specialty:  Cardiology Why:  at 3:20 [address] information: [place]. [place] [address] [phone]        [name], MD.  Schedule an appointment as soon as possible for a visit on 04/26/2017.   Specialty:  Family Medicine Why:  Hospital follow up @1 :30pm with Amy Contact information: [place] [address] [phone]            The results of significant diagnostics from this hospitalization (including imaging, microbiology, ancillary and laboratory) are listed below for reference.    Significant Diagnostic Studies: Dg Chest 2 View  Result Date: 04/11/2017 CLINICAL DATA:  Patient with chest pain. EXAM: CHEST  2 VIEW  COMPARISON:  Chest radiograph 01/18/2017. FINDINGS: Monitoring leads overlie the patient. Stable cardiomegaly status post median sternotomy. Bilateral perihilar interstitial opacities. Small bilateral pleural effusions. No pneumothorax. IMPRESSION: Mild bilateral interstitial pulmonary opacities raising the possibility of mild edema. Small bilateral pleural effusions. Electronically Signed   By: Lovey Newcomer M.D.   On: 04/11/2017 16:55   Ct Head Wo Contrast  Result Date: 04/13/2017 CLINICAL DATA:  61 year old male with unresponsiveness and altered mental status. EXAM: CT HEAD WITHOUT CONTRAST TECHNIQUE: Contiguous axial images were obtained from the base of the skull through the vertex without intravenous contrast. COMPARISON:  Head CT dated 12/15/2016 FINDINGS: Brain: No evidence of acute infarction, hemorrhage, hydrocephalus, extra-axial collection or mass lesion/mass effect. Vascular: No hyperdense vessel or unexpected calcification. Skull: Normal. Negative for fracture or focal lesion. Sinuses/Orbits: No acute finding. Other: None. IMPRESSION: No acute intracranial pathology. Electronically Signed   By: Anner Crete M.D.   On: 04/13/2017 22:09   Dg Chest Port 1 View  Result Date: 04/13/2017 CLINICAL DATA:  61 year old male with chest pain. EXAM: PORTABLE CHEST 1 VIEW COMPARISON:  Chest radiograph dated 04/11/2017 FINDINGS: There is stable mild cardiomegaly with mild vascular congestion. No focal consolidation, pleural effusion, or pneumothorax. Atherosclerotic calcification of the aortic arch. Median sternotomy wires and CABG vascular clips. No acute osseous pathology. IMPRESSION: Stable mild cardiomegaly with mild vascular congestion. No focal consolidation. Electronically Signed   By: Anner Crete M.D.   On: 04/13/2017 22:04    Microbiology: Recent Results (from the past 240 hour(s))  MRSA PCR Screening     Status: None   Collection Time: 04/12/17  2:42 AM  Result Value Ref Range Status     MRSA by PCR NEGATIVE NEGATIVE Final    Comment:        The GeneXpert MRSA Assay (FDA approved for NASAL specimens only), is one component of a comprehensive MRSA colonization surveillance program. It is not intended to diagnose MRSA infection nor to guide or monitor treatment for MRSA infections.      Labs: Basic Metabolic Panel:  Recent Labs Lab 04/12/17 0219  04/13/17 2121 04/13/17 2128 04/14/17 0500 04/14/17 0551 04/15/17 0317 04/16/17 1349  NA 136  --  131* 133* 142 137 132* 131*  K 5.0  < > 5.6* 5.5* 3.2* 4.4 5.4* 4.4  CL 98*  --  93* 94* 111 95* 95* 94*  CO2 27  --  26  --  23  --  27 26  GLUCOSE 68  --  326* 322* 60* 79 195* 123*  BUN 38*  --  36* 34* 30* 37* 45* 30*  CREATININE 6.06*  --  5.87* 5.60* 4.50* 6.00* 7.25* 5.58*  CALCIUM 8.8*  --  8.7*  --  6.4*  --  8.2* 8.6*  MG  --   --   --   --   --   --  2.5*  --   PHOS  --   --   --   --   --   --  6.8* 5.3*  < > = values in this interval not displayed. Liver Function Tests:  Recent Labs Lab 04/11/17 1529 04/13/17 2121 04/15/17 0317 04/16/17 1349  AST 37 22 20  --   ALT 33 21 17  --   ALKPHOS 112 108 98  --   BILITOT 0.7 0.8 0.7  --   PROT 7.0 7.4 6.3*  --   ALBUMIN 3.7 4.0 3.4* 3.6    Recent Labs Lab 04/11/17 1529  LIPASE 94*   No results for input(s): AMMONIA in the last 168 hours. CBC:  Recent Labs Lab 04/11/17 1529 04/12/17 0219 04/13/17 2121 04/13/17 2128 04/14/17 0551 04/15/17 0317 04/16/17 1348  WBC 6.0 6.1 5.5  --   --  5.7 6.8  NEUTROABS 3.5  --  2.8  --   --   --   --   HGB 10.8* 9.7* 10.7* 10.9* 10.2* 9.4* 10.2*  HCT 33.0* 30.1* 32.7* 32.0* 30.0* 29.2* 30.6*  MCV 93.0 92.6 95.6  --   --  93.9 93.3  PLT 151 167 172  --   --  174 143*   Cardiac Enzymes:  Recent Labs Lab 04/13/17 2121 04/14/17 0500 04/14/17 1503 04/14/17 1805 04/14/17 2332  TROPONINI 0.04* <0.03 0.04* 0.03* 0.03*   BNP: BNP (last 3 results)  Recent Labs  10/30/16 2024 12/15/16 2148  04/11/17 1529  BNP 1,900.0* 881.1* 1,724.5*    ProBNP (last 3 results) No results for input(s): PROBNP in the last 8760 hours.  CBG:  Recent Labs Lab 04/15/17 0659 04/15/17 1624 04/15/17 2147 04/16/17 0738 04/16/17 1135  GLUCAP 133* 179* 181* 250* 219*       Signed:  Cristal Ford  [facility] 04/16/2017, 2:19 PM

## 2017-04-16 NOTE — Discharge Instructions (Signed)
Syncope Syncope is when you temporarily lose consciousness. Syncope may also be called fainting or passing out. It is caused by a sudden decrease in blood flow to the brain. Even though most causes of syncope are not dangerous, syncope can be a sign of a serious medical problem. Signs that you may be about to faint include:  Feeling dizzy or light-headed.  Feeling nauseous.  Seeing all white or all black in your field of vision.  Having cold, clammy skin.  If you fainted, get medical help right away.Call your local emergency services (911 in the [place] Do not drive yourself to the hospital. Follow these instructions at home: Pay attention to any changes in your symptoms. Take these actions to help with your condition:  Have someone stay with you until you feel stable.  Do not drive, use machinery, or play sports until your health care provider says it is okay.  Keep all follow-up visits as told by your health care provider. This is important.  If you start to feel like you might faint, lie down right away and raise (elevate) your feet above the level of your heart. Breathe deeply and steadily. Wait until all of the symptoms have passed.  Drink enough fluid to keep your urine clear or pale yellow.  If you are taking blood pressure or heart medicine, get up slowly and take several minutes to sit and then stand. This can reduce dizziness.  Take over-the-counter and prescription medicines only as told by your health care provider.  Get help right away if:  You have a severe headache.  You have unusual pain in your chest, abdomen, or back.  You are bleeding from your mouth or rectum, or you have black or tarry stool.  You have a very fast or irregular heartbeat (palpitations).  You have pain with breathing.  You faint once or repeatedly.  You have a seizure.  You are confused.  You have trouble walking.  You have severe weakness.  You have vision problems. These  symptoms may represent a serious problem that is an emergency. Do not wait to see if your symptoms will go away. Get medical help right away. Call your local emergency services (911 in the [place] Do not drive yourself to the hospital. This information is not intended to replace advice given to you by your health care provider. Make sure you discuss any questions you have with your health care provider. Document Released: 08/31/2005 Document Revised: 02/06/2016 Document Reviewed: 05/15/2015 Elsevier Interactive Patient Education  2017 [employer]  Nonspecific Chest Pain Chest pain can be caused by many different conditions. There is always a chance that your pain could be related to something serious, such as a heart attack or a blood clot in your lungs. Chest pain can also be caused by conditions that are not life-threatening. If you have chest pain, it is very important to follow up with your health care provider. What are the causes? Causes of this condition include:  Heartburn.  Pneumonia or bronchitis.  Anxiety or stress.  Inflammation around your heart (pericarditis) or lung (pleuritis or pleurisy).  A blood clot in your lung.  A collapsed lung (pneumothorax). This can develop suddenly on its own (spontaneous pneumothorax) or from trauma to the chest.  Shingles infection (varicella-zoster virus).  Heart attack.  Damage to the bones, muscles, and cartilage that make up your chest wall. This can include: ? Bruised bones due to injury. ? Strained muscles or cartilage due to frequent or  repeated coughing or overwork. ? Fracture to one or more ribs. ? Sore cartilage due to inflammation (costochondritis).  What increases the risk? Risk factors for this condition may include:  Activities that increase your risk for trauma or injury to your chest.  Respiratory infections or conditions that cause frequent coughing.  Medical conditions or overeating that can cause  heartburn.  Heart disease or family history of heart disease.  Conditions or health behaviors that increase your risk of developing a blood clot.  Having had chicken pox (varicella zoster).  What are the signs or symptoms? Chest pain can feel like:  Burning or tingling on the surface of your chest or deep in your chest.  Crushing, pressure, aching, or squeezing pain.  Dull or sharp pain that is worse when you move, cough, or take a deep breath.  Pain that is also felt in your back, neck, shoulder, or arm, or pain that spreads to any of these areas.  Your chest pain may come and go, or it may stay constant. How is this diagnosed? Lab tests or other studies may be needed to find the cause of your pain. Your health care provider may have you take a test called an ECG (electrocardiogram). An ECG records your heartbeat patterns at the time the test is performed. You may also have other tests, such as:  Transthoracic echocardiogram (TTE). In this test, sound waves are used to create a picture of the heart structures and to look at how blood flows through your heart.  Transesophageal echocardiogram (TEE).This is a more advanced imaging test that takes images from inside your body. It allows your health care provider to see your heart in finer detail.  Cardiac monitoring. This allows your health care provider to monitor your heart rate and rhythm in real time.  Holter monitor. This is a portable device that records your heartbeat and can help to diagnose abnormal heartbeats. It allows your health care provider to track your heart activity for several days, if needed.  Stress tests. These can be done through exercise or by taking medicine that makes your heart beat more quickly.  Blood tests.  Other imaging tests.  How is this treated? Treatment depends on what is causing your chest pain. Treatment may include:  Medicines. These may include: ? Acid blockers for  heartburn. ? Anti-inflammatory medicine. ? Pain medicine for inflammatory conditions. ? Antibiotic medicine, if an infection is present. ? Medicines to dissolve blood clots. ? Medicines to treat coronary artery disease (CAD).  Supportive care for conditions that do not require medicines. This may include: ? Resting. ? Applying heat or cold packs to injured areas. ? Limiting activities until pain decreases.  Follow these instructions at home: Medicines  If you were prescribed an antibiotic, take it as told by your health care provider. Do not stop taking the antibiotic even if you start to feel better.  Take over-the-counter and prescription medicines only as told by your health care provider. Lifestyle  Do not use any products that contain nicotine or tobacco, such as cigarettes and e-cigarettes. If you need help quitting, ask your health care provider.  Do not drink alcohol.  Make lifestyle changes as directed by your health care provider. These may include: ? Getting regular exercise. Ask your health care provider to suggest some activities that are safe for you. ? Eating a heart-healthy diet. A registered dietitian can help you to learn healthy eating options. ? Maintaining a healthy weight. ? Managing diabetes, if  necessary. ? Reducing stress, such as with yoga or relaxation techniques. General instructions  Avoid any activities that bring on chest pain.  If heartburn is the cause for your chest pain, raise (elevate) the head of your bed about 6 inches (15 cm) by putting blocks under the legs. Sleeping with more pillows does not effectively relieve heartburn because it only changes the position of your head.  Keep all follow-up visits as told by your health care provider. This is important. This includes any further testing if your chest pain does not go away. Contact a health care provider if:  Your chest pain does not go away.  You have a rash with blisters on your  chest.  You have a fever.  You have chills. Get help right away if:  Your chest pain is worse.  You have a cough that gets worse, or you cough up blood.  You have severe pain in your abdomen.  You have severe weakness.  You faint.  You have sudden, unexplained chest discomfort.  You have sudden, unexplained discomfort in your arms, back, neck, or jaw.  You have shortness of breath at any time.  You suddenly start to sweat, or your skin gets clammy.  You feel nauseous or you vomit.  You suddenly feel light-headed or dizzy.  Your heart begins to beat quickly, or it feels like it is skipping beats. These symptoms may represent a serious problem that is an emergency. Do not wait to see if the symptoms will go away. Get medical help right away. Call your local emergency services (911 in the [place] Do not drive yourself to the hospital. This information is not intended to replace advice given to you by your health care provider. Make sure you discuss any questions you have with your health care provider. Document Released: 06/10/2005 Document Revised: 05/25/2016 Document Reviewed: 05/25/2016 Elsevier Interactive Patient Education  2017 [employer].

## 2017-04-16 NOTE — Progress Notes (Signed)
IV and telemetry removed.  Patient discharged via wheelchair to the car and home with his wife.

## 2017-04-16 NOTE — Progress Notes (Signed)
Pt d/c paper work given

## 2017-04-20 DIAGNOSIS — E1122 Type 2 diabetes mellitus with diabetic chronic kidney disease: Secondary | ICD-10-CM | POA: Diagnosis not present

## 2017-04-20 DIAGNOSIS — I2510 Atherosclerotic heart disease of native coronary artery without angina pectoris: Secondary | ICD-10-CM | POA: Diagnosis not present

## 2017-04-20 DIAGNOSIS — E11621 Type 2 diabetes mellitus with foot ulcer: Secondary | ICD-10-CM | POA: Diagnosis not present

## 2017-04-20 DIAGNOSIS — E1142 Type 2 diabetes mellitus with diabetic polyneuropathy: Secondary | ICD-10-CM | POA: Diagnosis not present

## 2017-04-20 DIAGNOSIS — I252 Old myocardial infarction: Secondary | ICD-10-CM | POA: Diagnosis not present

## 2017-04-20 DIAGNOSIS — S91104A Unspecified open wound of right lesser toe(s) without damage to nail, initial encounter: Secondary | ICD-10-CM | POA: Diagnosis not present

## 2017-04-20 DIAGNOSIS — N186 End stage renal disease: Secondary | ICD-10-CM | POA: Diagnosis not present

## 2017-04-20 DIAGNOSIS — L97513 Non-pressure chronic ulcer of other part of right foot with necrosis of muscle: Secondary | ICD-10-CM | POA: Diagnosis not present

## 2017-04-20 DIAGNOSIS — W458XXA Other foreign body or object entering through skin, initial encounter: Secondary | ICD-10-CM | POA: Diagnosis not present

## 2017-04-20 DIAGNOSIS — E1151 Type 2 diabetes mellitus with diabetic peripheral angiopathy without gangrene: Secondary | ICD-10-CM | POA: Diagnosis not present

## 2017-04-21 ENCOUNTER — Inpatient Hospital Stay (HOSPITAL_COMMUNITY): Admit: 2017-04-21 | Payer: BLUE CROSS/BLUE SHIELD | Admitting: Cardiology

## 2017-04-29 DIAGNOSIS — E11621 Type 2 diabetes mellitus with foot ulcer: Secondary | ICD-10-CM | POA: Diagnosis not present

## 2017-05-06 DIAGNOSIS — E11621 Type 2 diabetes mellitus with foot ulcer: Secondary | ICD-10-CM | POA: Diagnosis not present

## 2017-05-13 ENCOUNTER — Ambulatory Visit (HOSPITAL_COMMUNITY)
Admission: RE | Admit: 2017-05-13 | Discharge: 2017-05-13 | Disposition: A | Discharge status: Home / Self Care | Payer: Medicare Other | Source: Ambulatory Visit | Attending: Cardiology | Admitting: Cardiology

## 2017-05-13 ENCOUNTER — Encounter (HOSPITAL_COMMUNITY): Payer: Self-pay

## 2017-05-13 VITALS — BP 168/82 | HR 67 | Wt 147.2 lb

## 2017-05-13 DIAGNOSIS — E1151 Type 2 diabetes mellitus with diabetic peripheral angiopathy without gangrene: Secondary | ICD-10-CM | POA: Insufficient documentation

## 2017-05-13 DIAGNOSIS — I252 Old myocardial infarction: Secondary | ICD-10-CM | POA: Diagnosis not present

## 2017-05-13 DIAGNOSIS — I255 Ischemic cardiomyopathy: Secondary | ICD-10-CM | POA: Insufficient documentation

## 2017-05-13 DIAGNOSIS — I2510 Atherosclerotic heart disease of native coronary artery without angina pectoris: Secondary | ICD-10-CM | POA: Diagnosis not present

## 2017-05-13 DIAGNOSIS — N186 End stage renal disease: Secondary | ICD-10-CM

## 2017-05-13 DIAGNOSIS — I5042 Chronic combined systolic (congestive) and diastolic (congestive) heart failure: Secondary | ICD-10-CM | POA: Insufficient documentation

## 2017-05-13 DIAGNOSIS — I132 Hypertensive heart and chronic kidney disease with heart failure and with stage 5 chronic kidney disease, or end stage renal disease: Secondary | ICD-10-CM | POA: Insufficient documentation

## 2017-05-13 DIAGNOSIS — E785 Hyperlipidemia, unspecified: Secondary | ICD-10-CM | POA: Insufficient documentation

## 2017-05-13 DIAGNOSIS — Z951 Presence of aortocoronary bypass graft: Secondary | ICD-10-CM | POA: Diagnosis not present

## 2017-05-13 DIAGNOSIS — Z7902 Long term (current) use of antithrombotics/antiplatelets: Secondary | ICD-10-CM | POA: Diagnosis not present

## 2017-05-13 DIAGNOSIS — E7849 Other hyperlipidemia: Secondary | ICD-10-CM

## 2017-05-13 DIAGNOSIS — I5022 Chronic systolic (congestive) heart failure: Secondary | ICD-10-CM | POA: Diagnosis not present

## 2017-05-13 DIAGNOSIS — Z7982 Long term (current) use of aspirin: Secondary | ICD-10-CM | POA: Diagnosis not present

## 2017-05-13 DIAGNOSIS — E119 Type 2 diabetes mellitus without complications: Secondary | ICD-10-CM | POA: Diagnosis present

## 2017-05-13 DIAGNOSIS — I10 Essential (primary) hypertension: Secondary | ICD-10-CM | POA: Diagnosis not present

## 2017-05-13 DIAGNOSIS — E784 Other hyperlipidemia: Secondary | ICD-10-CM | POA: Diagnosis not present

## 2017-05-13 DIAGNOSIS — Z79899 Other long term (current) drug therapy: Secondary | ICD-10-CM | POA: Insufficient documentation

## 2017-05-13 DIAGNOSIS — N185 Chronic kidney disease, stage 5: Secondary | ICD-10-CM | POA: Diagnosis not present

## 2017-05-13 DIAGNOSIS — E1122 Type 2 diabetes mellitus with diabetic chronic kidney disease: Secondary | ICD-10-CM | POA: Diagnosis not present

## 2017-05-13 DIAGNOSIS — Z992 Dependence on renal dialysis: Secondary | ICD-10-CM | POA: Diagnosis not present

## 2017-05-13 DIAGNOSIS — Z9641 Presence of insulin pump (external) (internal): Secondary | ICD-10-CM | POA: Diagnosis not present

## 2017-05-13 MED ORDER — HYDRALAZINE HCL 25 MG PO TABS: 12.5000 mg | ORAL_TABLET | Freq: Two times a day (BID) | ORAL | 6 refills | Status: DC

## 2017-05-13 NOTE — Progress Notes (Signed)
PCP: Dr. Tasia Catchings [facility]) Cardiology: Dr. Aundra Dubin  61 yo with history of type II diabetes, CKD stage IV requiring HD for a period of time, CAD, and chronic systolic CHF presents for cardiology followup.  He was admitted in 9/17 with NSTEMI.  LHC showed 3VD and he had CABG x 3.  He had CKD stage IV when he was admitted and developed ESRD requiring HD while in the hospital.  He had transient atrial fibrillation post-CABG.  He developed cardiogenic shock requiring milrinone but was able to taper off.  Echo in 9/17 showed EF 35-40%.    Admitted 8/1 through 04/16/2017 with malignant hypertension due to medication noncompliance. Continues on HD.  HF M-W-F. Discharge weight 140 pounds.   Today he returns for HF follow up with an interpreter. Overall feeling ok. Denies SOB/PND/Orthopnea. Tolerating HD M-W-F. No bleeding problems. Says he is taking all medications.       PMH: 1. Type II diabetes 2. HTN 3. CKD stage IV: Was on HD briefly but now off.  4. CAD: NSTEMI 9/17 with CABG x 3 (LIMA-LAD, SVG-OM, SVG-RCA).   5. Chronic systolic CHF: Ischemic cardiomyopathy.   - Echo (9/17) with EF 35-40%, grade II diastolic dysfunction.  - Echo (1/18) with EF 60-65%, grade II diastolic dysfunction. 6. Atrial fibrillation: Paroxysmal.  Only noted post-op CABG.  7. ABIs normal in 12/17.   SH: Married, lives in [place], 1 son, nonsmoker, no ETOH, speaks only [place].   FH: No premature CAD.  ROS: All systems reviewed and negative except as per HPI.   Current Outpatient Prescriptions  Medication Sig Dispense Refill  . aspirin 81 MG chewable tablet Chew 1 tablet (81 mg total) by mouth daily. 30 tablet 1  . atorvastatin (LIPITOR) 80 MG tablet TAKE ONE TABLET BY MOUTH ONCE DAILY (Patient taking differently: TAKE 80 MG BY MOUTH ONCE DAILY) 30 tablet 3  . citalopram (CELEXA) 20 MG tablet Take 20 mg by mouth daily.  1  . clopidogrel (PLAVIX) 75 MG tablet Take 1 tablet (75 mg total) by mouth daily. (Patient taking  differently: Take 75 mg by mouth daily with lunch. ) 90 tablet 2  . ezetimibe (ZETIA) 10 MG tablet Take 1 tablet (10 mg total) by mouth daily. 30 tablet 3  . gabapentin (NEURONTIN) 300 MG capsule Take 300 mg by mouth at bedtime.  0  . Insulin Human (INSULIN PUMP) SOLN Inject into the skin continuous. NOVOLOG    . isosorbide mononitrate (IMDUR) 30 MG 24 hr tablet Take 1 tablet (30 mg total) by mouth daily. 30 tablet 0  . losartan (COZAAR) 50 MG tablet Take 1 tablet (50 mg total) by mouth daily. 30 tablet 0  . metoprolol succinate (TOPROL-XL) 25 MG 24 hr tablet Take 1 tablet (25 mg total) by mouth at bedtime. 30 tablet 0  . NOVOLOG 100 UNIT/ML injection 20-45 Units See admin instructions. Uses Novolog with Insulin Pump 20-45 units daily.  2  . pantoprazole (PROTONIX) 40 MG tablet Take 40 mg by mouth daily with lunch.  0  . vitamin C (ASCORBIC ACID) 500 MG tablet Take 1,000 mg by mouth daily.     No current facility-administered medications for this encounter.    BP (!) 168/82 (BP Location: Right Arm, Patient Position: Sitting, Cuff Size: Normal)   Pulse 67   Wt 147 lb 3.2 oz (66.8 kg)   SpO2 97%   BMI 23.76 kg/m   General:  Well appearing. No resp difficulty Arrived with interpreter HEENT: normal Neck:  supple. JVP ~10 . Carotids 2+ bilat; no bruits. No lymphadenopathy or thryomegaly appreciated. Cor: PMI nondisplaced. Regular rate & rhythm. No rubs, gallops or murmurs. Lungs: clear Abdomen: soft, nontender, nondistended. No hepatosplenomegaly. No bruits or masses. Good bowel sounds. Extremities: no cyanosis, clubbing, rash, LUE AV fistula  Neuro: alert & orientedx3, cranial nerves grossly intact. moves all 4 extremities w/o difficulty. Affect pleasant  Assessment/Plan: 1. CAD: s/p CABG.  Cath 10/2016 significant CAD. Recommendations for medical management.  - No S/S ischemia. Continue ASA and atorvastatin.    2. Hyperlipidemia: Continue statin. Continue Zetia + atorvastatin.  3. Chronic  systolic/diastolic   CHF: Ischemic cardiomyopathy.   Most recent  EF 12/2016 EF 55% RV normal Grade IDD  -on dilaysis for volume control.  - Continue toprol xl 25 mg at bed time.  -Continue losaran  50 mg daily.   4. PAD: ABIs normal in 12/17. 5. HTN: Continue current medications. Add hydralazine 12.5 mg twice a day.  6. CKD: Stage V. On HD M-W-F.   Follow up in 3 months.    Amy Clegg 05/13/2017

## 2017-05-13 NOTE — Addendum Note (Signed)
Encounter addended by: Darrick Grinder D, NP on: 05/13/2017 10:14 AM<BR>    Actions taken: LOS modified, Follow-up modified

## 2017-05-13 NOTE — Patient Instructions (Signed)
START Hydralazine 12.5 mg (1/2 tablet) twice daily.  No labs today.  Follow up 3 months with Dr. Aundra Dubin. Take all medication as prescribed the day of your appointment. Bring all medications with you to your appointment.  Do the following things EVERYDAY: 1) Weigh yourself in the morning before breakfast. Write it down and keep it in a log. 2) Take your medicines as prescribed 3) Eat low salt foods-Limit salt (sodium) to 2000 mg per day.  4) Stay as active as you can everyday 5) Limit all fluids for the day to less than 2 liters

## 2017-05-20 ENCOUNTER — Encounter (HOSPITAL_BASED_OUTPATIENT_CLINIC_OR_DEPARTMENT_OTHER): Payer: Medicare Other | Attending: Internal Medicine

## 2017-05-20 DIAGNOSIS — I252 Old myocardial infarction: Secondary | ICD-10-CM | POA: Insufficient documentation

## 2017-05-20 DIAGNOSIS — I2510 Atherosclerotic heart disease of native coronary artery without angina pectoris: Secondary | ICD-10-CM | POA: Insufficient documentation

## 2017-05-20 DIAGNOSIS — E11621 Type 2 diabetes mellitus with foot ulcer: Secondary | ICD-10-CM | POA: Insufficient documentation

## 2017-05-20 DIAGNOSIS — L97519 Non-pressure chronic ulcer of other part of right foot with unspecified severity: Secondary | ICD-10-CM | POA: Diagnosis not present

## 2017-05-20 DIAGNOSIS — E1151 Type 2 diabetes mellitus with diabetic peripheral angiopathy without gangrene: Secondary | ICD-10-CM | POA: Diagnosis not present

## 2017-05-20 DIAGNOSIS — L03115 Cellulitis of right lower limb: Secondary | ICD-10-CM | POA: Insufficient documentation

## 2017-05-20 DIAGNOSIS — E1122 Type 2 diabetes mellitus with diabetic chronic kidney disease: Secondary | ICD-10-CM | POA: Diagnosis not present

## 2017-05-20 DIAGNOSIS — N186 End stage renal disease: Secondary | ICD-10-CM | POA: Diagnosis not present

## 2017-05-20 DIAGNOSIS — I509 Heart failure, unspecified: Secondary | ICD-10-CM | POA: Diagnosis not present

## 2017-05-20 DIAGNOSIS — E1142 Type 2 diabetes mellitus with diabetic polyneuropathy: Secondary | ICD-10-CM | POA: Diagnosis not present

## 2017-06-03 DIAGNOSIS — E11621 Type 2 diabetes mellitus with foot ulcer: Secondary | ICD-10-CM | POA: Diagnosis not present

## 2017-07-05 ENCOUNTER — Other Ambulatory Visit (HOSPITAL_COMMUNITY): Payer: BLUE CROSS/BLUE SHIELD

## 2017-07-05 ENCOUNTER — Inpatient Hospital Stay (HOSPITAL_COMMUNITY): Payer: Medicare Other

## 2017-07-05 ENCOUNTER — Inpatient Hospital Stay (HOSPITAL_COMMUNITY)
Admission: EM | Admit: 2017-07-05 | Discharge: 2017-07-08 | DRG: 246 | Disposition: A | Discharge status: Home / Self Care | Payer: Medicare Other | Attending: Family Medicine | Admitting: Family Medicine

## 2017-07-05 ENCOUNTER — Emergency Department (HOSPITAL_COMMUNITY): Payer: Medicare Other

## 2017-07-05 ENCOUNTER — Encounter (HOSPITAL_COMMUNITY): Payer: Self-pay | Admitting: Internal Medicine

## 2017-07-05 DIAGNOSIS — Z7982 Long term (current) use of aspirin: Secondary | ICD-10-CM | POA: Diagnosis not present

## 2017-07-05 DIAGNOSIS — Z9889 Other specified postprocedural states: Secondary | ICD-10-CM

## 2017-07-05 DIAGNOSIS — Z87891 Personal history of nicotine dependence: Secondary | ICD-10-CM

## 2017-07-05 DIAGNOSIS — I132 Hypertensive heart and chronic kidney disease with heart failure and with stage 5 chronic kidney disease, or end stage renal disease: Secondary | ICD-10-CM | POA: Diagnosis present

## 2017-07-05 DIAGNOSIS — Z992 Dependence on renal dialysis: Secondary | ICD-10-CM | POA: Diagnosis not present

## 2017-07-05 DIAGNOSIS — I953 Hypotension of hemodialysis: Secondary | ICD-10-CM | POA: Diagnosis not present

## 2017-07-05 DIAGNOSIS — R570 Cardiogenic shock: Secondary | ICD-10-CM | POA: Diagnosis present

## 2017-07-05 DIAGNOSIS — N2581 Secondary hyperparathyroidism of renal origin: Secondary | ICD-10-CM | POA: Diagnosis present

## 2017-07-05 DIAGNOSIS — J810 Acute pulmonary edema: Secondary | ICD-10-CM | POA: Diagnosis present

## 2017-07-05 DIAGNOSIS — I25810 Atherosclerosis of coronary artery bypass graft(s) without angina pectoris: Secondary | ICD-10-CM | POA: Diagnosis not present

## 2017-07-05 DIAGNOSIS — I252 Old myocardial infarction: Secondary | ICD-10-CM | POA: Diagnosis not present

## 2017-07-05 DIAGNOSIS — Z794 Long term (current) use of insulin: Secondary | ICD-10-CM

## 2017-07-05 DIAGNOSIS — Z9641 Presence of insulin pump (external) (internal): Secondary | ICD-10-CM | POA: Diagnosis present

## 2017-07-05 DIAGNOSIS — R748 Abnormal levels of other serum enzymes: Secondary | ICD-10-CM

## 2017-07-05 DIAGNOSIS — I161 Hypertensive emergency: Secondary | ICD-10-CM | POA: Diagnosis present

## 2017-07-05 DIAGNOSIS — Z8249 Family history of ischemic heart disease and other diseases of the circulatory system: Secondary | ICD-10-CM

## 2017-07-05 DIAGNOSIS — Z79899 Other long term (current) drug therapy: Secondary | ICD-10-CM

## 2017-07-05 DIAGNOSIS — D631 Anemia in chronic kidney disease: Secondary | ICD-10-CM | POA: Diagnosis present

## 2017-07-05 DIAGNOSIS — Z955 Presence of coronary angioplasty implant and graft: Secondary | ICD-10-CM

## 2017-07-05 DIAGNOSIS — N185 Chronic kidney disease, stage 5: Secondary | ICD-10-CM | POA: Diagnosis not present

## 2017-07-05 DIAGNOSIS — Z951 Presence of aortocoronary bypass graft: Secondary | ICD-10-CM | POA: Diagnosis not present

## 2017-07-05 DIAGNOSIS — I25118 Atherosclerotic heart disease of native coronary artery with other forms of angina pectoris: Secondary | ICD-10-CM | POA: Diagnosis present

## 2017-07-05 DIAGNOSIS — Z833 Family history of diabetes mellitus: Secondary | ICD-10-CM

## 2017-07-05 DIAGNOSIS — Z800 Family history of malignant neoplasm of digestive organs: Secondary | ICD-10-CM | POA: Diagnosis not present

## 2017-07-05 DIAGNOSIS — N186 End stage renal disease: Secondary | ICD-10-CM | POA: Diagnosis present

## 2017-07-05 DIAGNOSIS — I4891 Unspecified atrial fibrillation: Secondary | ICD-10-CM | POA: Diagnosis present

## 2017-07-05 DIAGNOSIS — I5043 Acute on chronic combined systolic (congestive) and diastolic (congestive) heart failure: Secondary | ICD-10-CM | POA: Diagnosis present

## 2017-07-05 DIAGNOSIS — I2510 Atherosclerotic heart disease of native coronary artery without angina pectoris: Secondary | ICD-10-CM | POA: Diagnosis present

## 2017-07-05 DIAGNOSIS — E1051 Type 1 diabetes mellitus with diabetic peripheral angiopathy without gangrene: Secondary | ICD-10-CM | POA: Diagnosis present

## 2017-07-05 DIAGNOSIS — I5033 Acute on chronic diastolic (congestive) heart failure: Secondary | ICD-10-CM

## 2017-07-05 DIAGNOSIS — I214 Non-ST elevation (NSTEMI) myocardial infarction: Secondary | ICD-10-CM | POA: Diagnosis present

## 2017-07-05 DIAGNOSIS — E1022 Type 1 diabetes mellitus with diabetic chronic kidney disease: Secondary | ICD-10-CM | POA: Diagnosis present

## 2017-07-05 DIAGNOSIS — I509 Heart failure, unspecified: Secondary | ICD-10-CM

## 2017-07-05 DIAGNOSIS — I340 Nonrheumatic mitral (valve) insufficiency: Secondary | ICD-10-CM | POA: Diagnosis not present

## 2017-07-05 DIAGNOSIS — N189 Chronic kidney disease, unspecified: Secondary | ICD-10-CM

## 2017-07-05 LAB — CBC WITH DIFFERENTIAL/PLATELET
BASOS PCT: 0 %
Basophils Absolute: 0 10*3/uL (ref 0.0–0.1)
EOS ABS: 0.3 10*3/uL (ref 0.0–0.7)
EOS PCT: 5 %
HEMATOCRIT: 29 % — AB (ref 39.0–52.0)
Hemoglobin: 9.5 g/dL — ABNORMAL LOW (ref 13.0–17.0)
Lymphocytes Relative: 21 %
Lymphs Abs: 1.4 10*3/uL (ref 0.7–4.0)
MCH: 30.4 pg (ref 26.0–34.0)
MCHC: 32.8 g/dL (ref 30.0–36.0)
MCV: 92.7 fL (ref 78.0–100.0)
MONO ABS: 0.7 10*3/uL (ref 0.1–1.0)
MONOS PCT: 11 %
Neutro Abs: 4.1 10*3/uL (ref 1.7–7.7)
Neutrophils Relative %: 63 %
Platelets: 140 10*3/uL — ABNORMAL LOW (ref 150–400)
RBC: 3.13 MIL/uL — ABNORMAL LOW (ref 4.22–5.81)
RDW: 14.5 % (ref 11.5–15.5)
WBC: 6.6 10*3/uL (ref 4.0–10.5)

## 2017-07-05 LAB — I-STAT CHEM 8, ED
BUN: 37 mg/dL — AB (ref 6–20)
CALCIUM ION: 1.01 mmol/L — AB (ref 1.15–1.40)
CREATININE: 7 mg/dL — AB (ref 0.61–1.24)
Chloride: 100 mmol/L — ABNORMAL LOW (ref 101–111)
GLUCOSE: 162 mg/dL — AB (ref 65–99)
HCT: 29 % — ABNORMAL LOW (ref 39.0–52.0)
Hemoglobin: 9.9 g/dL — ABNORMAL LOW (ref 13.0–17.0)
Potassium: 5.5 mmol/L — ABNORMAL HIGH (ref 3.5–5.1)
Sodium: 137 mmol/L (ref 135–145)
TCO2: 26 mmol/L (ref 22–32)

## 2017-07-05 LAB — COMPREHENSIVE METABOLIC PANEL
ALBUMIN: 3.5 g/dL (ref 3.5–5.0)
ALK PHOS: 118 U/L (ref 38–126)
ALT: 51 U/L (ref 17–63)
AST: 61 U/L — ABNORMAL HIGH (ref 15–41)
Anion gap: 8 (ref 5–15)
BUN: 38 mg/dL — AB (ref 6–20)
CALCIUM: 8.4 mg/dL — AB (ref 8.9–10.3)
CHLORIDE: 100 mmol/L — AB (ref 101–111)
CO2: 26 mmol/L (ref 22–32)
CREATININE: 6.76 mg/dL — AB (ref 0.61–1.24)
GFR, EST AFRICAN AMERICAN: 9 mL/min — AB (ref >60)
GFR, EST NON AFRICAN AMERICAN: 8 mL/min — AB (ref >60)
Glucose, Bld: 161 mg/dL — ABNORMAL HIGH (ref 65–99)
Potassium: 5.5 mmol/L — ABNORMAL HIGH (ref 3.5–5.1)
SODIUM: 134 mmol/L — AB (ref 135–145)
Total Bilirubin: 0.8 mg/dL (ref 0.3–1.2)
Total Protein: 7 g/dL (ref 6.5–8.1)

## 2017-07-05 LAB — TROPONIN I
TROPONIN I: 1.56 ng/mL — AB (ref <0.03)
TROPONIN I: 2.16 ng/mL — AB (ref <0.03)
TROPONIN I: 1.92 ng/mL — AB (ref <0.03)
Troponin I: 2.62 ng/mL
Troponin I: 1.93 ng/mL (ref <0.03)

## 2017-07-05 LAB — I-STAT ARTERIAL BLOOD GAS, ED
ACID-BASE EXCESS: 3 mmol/L — AB (ref 0.0–2.0)
Bicarbonate: 28 mmol/L (ref 20.0–28.0)
O2 SAT: 97 %
PH ART: 7.787 — AB (ref 7.350–7.450)
PO2 ART: 19 mmHg — AB (ref 83.0–108.0)
Patient temperature: 13
TCO2: 29 mmol/L (ref 22–32)

## 2017-07-05 LAB — CBC
HCT: 27.7 % — ABNORMAL LOW (ref 39.0–52.0)
Hemoglobin: 9.4 g/dL — ABNORMAL LOW (ref 13.0–17.0)
MCH: 31.3 pg (ref 26.0–34.0)
MCHC: 33.9 g/dL (ref 30.0–36.0)
MCV: 92.3 fL (ref 78.0–100.0)
Platelets: 159 K/uL (ref 150–400)
RBC: 3 MIL/uL — ABNORMAL LOW (ref 4.22–5.81)
RDW: 14.8 % (ref 11.5–15.5)
WBC: 6.9 K/uL (ref 4.0–10.5)

## 2017-07-05 LAB — GLUCOSE, CAPILLARY
GLUCOSE-CAPILLARY: 74 mg/dL (ref 65–99)
GLUCOSE-CAPILLARY: 64 mg/dL — AB (ref 65–99)
Glucose-Capillary: 203 mg/dL — ABNORMAL HIGH (ref 65–99)
Glucose-Capillary: 74 mg/dL (ref 65–99)

## 2017-07-05 LAB — I-STAT TROPONIN, ED: Troponin i, poc: 1.48 ng/mL (ref 0.00–0.08)

## 2017-07-05 LAB — I-STAT CG4 LACTIC ACID, ED: Lactic Acid, Venous: 1.34 mmol/L (ref 0.5–1.9)

## 2017-07-05 LAB — CBG MONITORING, ED: GLUCOSE-CAPILLARY: 99 mg/dL (ref 65–99)

## 2017-07-05 LAB — CREATININE, SERUM
Creatinine, Ser: 7.01 mg/dL — ABNORMAL HIGH (ref 0.61–1.24)
GFR calc Af Amer: 9 mL/min — ABNORMAL LOW
GFR calc non Af Amer: 8 mL/min — ABNORMAL LOW

## 2017-07-05 LAB — MRSA PCR SCREENING: MRSA BY PCR: NEGATIVE

## 2017-07-05 MED ORDER — HYDRALAZINE HCL 25 MG PO TABS
25.0000 mg | ORAL_TABLET | Freq: Two times a day (BID) | ORAL | Status: DC
  Administered 2017-07-05 – 2017-07-08 (×6): 25 mg via ORAL
  Filled 2017-07-05 (×6): qty 1

## 2017-07-05 MED ORDER — NITROGLYCERIN IN D5W 200-5 MCG/ML-% IV SOLN
0.0000 ug/min | Freq: Once | INTRAVENOUS | Status: AC
  Administered 2017-07-05: 5 ug/min via INTRAVENOUS
  Filled 2017-07-05: qty 250

## 2017-07-05 MED ORDER — EZETIMIBE 10 MG PO TABS
10.0000 mg | ORAL_TABLET | Freq: Every day | ORAL | Status: DC
  Administered 2017-07-05 – 2017-07-08 (×4): 10 mg via ORAL
  Filled 2017-07-05 (×4): qty 1

## 2017-07-05 MED ORDER — DOXERCALCIFEROL 4 MCG/2ML IV SOLN
INTRAVENOUS | Status: AC
  Administered 2017-07-05: 13:00:00
  Filled 2017-07-05: qty 2

## 2017-07-05 MED ORDER — NITROGLYCERIN 2 % TD OINT
1.0000 [in_us] | TOPICAL_OINTMENT | Freq: Once | TRANSDERMAL | Status: AC
  Administered 2017-07-05: 1 [in_us] via TOPICAL
  Filled 2017-07-05: qty 1

## 2017-07-05 MED ORDER — DARBEPOETIN ALFA 100 MCG/0.5ML IJ SOSY
PREFILLED_SYRINGE | INTRAMUSCULAR | Status: AC
  Filled 2017-07-05: qty 0.5

## 2017-07-05 MED ORDER — ATORVASTATIN CALCIUM 80 MG PO TABS
80.0000 mg | ORAL_TABLET | Freq: Every day | ORAL | Status: DC
  Administered 2017-07-05 – 2017-07-08 (×4): 80 mg via ORAL
  Filled 2017-07-05 (×4): qty 1

## 2017-07-05 MED ORDER — ONDANSETRON HCL 4 MG PO TABS: 4.0000 mg | ORAL_TABLET | Freq: Four times a day (QID) | ORAL | Status: DC | PRN

## 2017-07-05 MED ORDER — ACETAMINOPHEN 325 MG PO TABS
650.0000 mg | ORAL_TABLET | Freq: Four times a day (QID) | ORAL | Status: DC | PRN
  Administered 2017-07-05 – 2017-07-08 (×2): 650 mg via ORAL
  Filled 2017-07-05 (×2): qty 2

## 2017-07-05 MED ORDER — HYDRALAZINE HCL 25 MG PO TABS
12.5000 mg | ORAL_TABLET | Freq: Two times a day (BID) | ORAL | Status: DC
  Administered 2017-07-05: 12.5 mg via ORAL

## 2017-07-05 MED ORDER — HYDRALAZINE HCL 20 MG/ML IJ SOLN: 10.0000 mg | Freq: Three times a day (TID) | INTRAMUSCULAR | Status: DC | PRN

## 2017-07-05 MED ORDER — INSULIN PUMP
SUBCUTANEOUS | Status: DC
  Administered 2017-07-05: 5 via SUBCUTANEOUS
  Administered 2017-07-05: 9.5 via SUBCUTANEOUS
  Administered 2017-07-06: 1 via SUBCUTANEOUS
  Administered 2017-07-07: 3 via SUBCUTANEOUS
  Administered 2017-07-07: 19:00:00 via SUBCUTANEOUS
  Filled 2017-07-05: qty 1

## 2017-07-05 MED ORDER — IPRATROPIUM-ALBUTEROL 0.5-2.5 (3) MG/3ML IN SOLN
3.0000 mL | Freq: Once | RESPIRATORY_TRACT | Status: AC
  Administered 2017-07-05: 3 mL via RESPIRATORY_TRACT
  Filled 2017-07-05: qty 3

## 2017-07-05 MED ORDER — MORPHINE SULFATE (PF) 4 MG/ML IV SOLN: 4.0000 mg | Freq: Once | INTRAVENOUS | Status: DC

## 2017-07-05 MED ORDER — ACETAMINOPHEN 650 MG RE SUPP: 650.0000 mg | Freq: Four times a day (QID) | RECTAL | Status: DC | PRN

## 2017-07-05 MED ORDER — MORPHINE SULFATE (PF) 2 MG/ML IV SOLN
0.5000 mg | Freq: Once | INTRAVENOUS | Status: AC
  Administered 2017-07-05: 0.5 mg via INTRAVENOUS
  Filled 2017-07-05: qty 1

## 2017-07-05 MED ORDER — DARBEPOETIN ALFA 100 MCG/0.5ML IJ SOSY
100.0000 ug | PREFILLED_SYRINGE | INTRAMUSCULAR | Status: DC
  Administered 2017-07-05: 100 ug via INTRAVENOUS

## 2017-07-05 MED ORDER — CITALOPRAM HYDROBROMIDE 20 MG PO TABS
20.0000 mg | ORAL_TABLET | Freq: Every day | ORAL | Status: DC
  Administered 2017-07-05 – 2017-07-08 (×4): 20 mg via ORAL
  Filled 2017-07-05 (×4): qty 1

## 2017-07-05 MED ORDER — HEPARIN SODIUM (PORCINE) 5000 UNIT/ML IJ SOLN
5000.0000 [IU] | Freq: Three times a day (TID) | INTRAMUSCULAR | Status: DC
  Administered 2017-07-05 – 2017-07-08 (×7): 5000 [IU] via SUBCUTANEOUS
  Filled 2017-07-05 (×7): qty 1

## 2017-07-05 MED ORDER — DOXERCALCIFEROL 4 MCG/2ML IV SOLN
2.0000 ug | INTRAVENOUS | Status: DC
  Administered 2017-07-05 – 2017-07-08 (×2): 2 ug via INTRAVENOUS

## 2017-07-05 MED ORDER — LOSARTAN POTASSIUM 50 MG PO TABS
50.0000 mg | ORAL_TABLET | Freq: Every day | ORAL | Status: DC
  Administered 2017-07-05 – 2017-07-07 (×3): 50 mg via ORAL
  Filled 2017-07-05 (×3): qty 1

## 2017-07-05 MED ORDER — ISOSORBIDE MONONITRATE ER 30 MG PO TB24
30.0000 mg | ORAL_TABLET | Freq: Every day | ORAL | Status: DC
  Administered 2017-07-05 – 2017-07-08 (×4): 30 mg via ORAL
  Filled 2017-07-05 (×4): qty 1

## 2017-07-05 MED ORDER — ASPIRIN 81 MG PO CHEW
81.0000 mg | CHEWABLE_TABLET | Freq: Every day | ORAL | Status: DC
  Administered 2017-07-05 – 2017-07-08 (×3): 81 mg via ORAL
  Filled 2017-07-05 (×3): qty 1

## 2017-07-05 MED ORDER — HYDRALAZINE HCL 20 MG/ML IJ SOLN
INTRAMUSCULAR | Status: AC
  Filled 2017-07-05: qty 1

## 2017-07-05 MED ORDER — MORPHINE SULFATE (PF) 4 MG/ML IV SOLN
4.0000 mg | Freq: Once | INTRAVENOUS | Status: AC
  Administered 2017-07-05: 4 mg via INTRAVENOUS
  Filled 2017-07-05: qty 1

## 2017-07-05 MED ORDER — CLOPIDOGREL BISULFATE 75 MG PO TABS
75.0000 mg | ORAL_TABLET | Freq: Every day | ORAL | Status: DC
  Administered 2017-07-05 – 2017-07-08 (×4): 75 mg via ORAL
  Filled 2017-07-05 (×4): qty 1

## 2017-07-05 MED ORDER — LORAZEPAM 2 MG/ML IJ SOLN: 1.0000 mg | Freq: Once | INTRAMUSCULAR | Status: DC

## 2017-07-05 MED ORDER — GABAPENTIN 300 MG PO CAPS
300.0000 mg | ORAL_CAPSULE | Freq: Every day | ORAL | Status: DC
  Administered 2017-07-05 – 2017-07-07 (×3): 300 mg via ORAL
  Filled 2017-07-05 (×3): qty 1

## 2017-07-05 MED ORDER — NITROGLYCERIN IN D5W 200-5 MCG/ML-% IV SOLN
0.0000 ug/min | Freq: Once | INTRAVENOUS | Status: AC
  Administered 2017-07-05: 15 ug/min via INTRAVENOUS

## 2017-07-05 MED ORDER — ONDANSETRON HCL 4 MG/2ML IJ SOLN
4.0000 mg | Freq: Four times a day (QID) | INTRAMUSCULAR | Status: DC | PRN
  Administered 2017-07-08: 03:00:00 4 mg via INTRAVENOUS
  Filled 2017-07-05: qty 2

## 2017-07-05 MED ORDER — PANTOPRAZOLE SODIUM 40 MG PO TBEC
40.0000 mg | DELAYED_RELEASE_TABLET | Freq: Every day | ORAL | Status: DC
Start: 2017-07-05 — End: 2017-07-08
  Administered 2017-07-05 – 2017-07-08 (×4): 40 mg via ORAL
  Filled 2017-07-05 (×4): qty 1

## 2017-07-05 NOTE — Progress Notes (Signed)
BIPAP initiated per MD order. Patient  has increased WOB, increased HR. Placed on 10/5 via FFM , VT >350 BUR 10 30%.

## 2017-07-05 NOTE — H&P (Signed)
History and Physical    DUFFY Kenneth [name] FTD:[id number] DOB: 14-Jul-1956 DOA: 07/05/2017  PCP: [name], MD  Patient coming from: Home.  Chief Complaint: Shortness of breath and chest pain.  HPI: Kenneth [name] is a 61 y.o. male with history of ESRD on dialysis on Monday Wednesday and Friday, CAD status post CABG, hypertension and diabetes mellitus on insulin pump presents to the ER with complaints of shortness of breath and chest pain.  Patient shortness of breath started last evening which has been progressively worsening.  Has been having some cough.  Present even at rest.  Patient has not missed his dialysis.  Patient chest pain has been coming off and on for last 1 week.  Retrosternal increase on exertion.  ED Course: In the ER patient was found to have markedly elevated blood pressure and troponin was positive.  EKG was showing more pronounced inferolateral ST depression.  Chest x-ray was showing congestion.  Patient was placed on BiPAP.  Cardiology was notified.  Patient is being admitted for acute pulmonary edema and non-ST elevation MI.  Patient is presently chest pain-free.  Review of Systems: As per HPI, rest all negative.   Past Medical History:  Diagnosis Date  . Acute pulmonary edema ([place]) 05/31/2016  . Acute respiratory failure with hypoxemia ([place]) 05/31/2016  . CHF (congestive heart failure) ([place])   . 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 ESRD (end stage renal disease) on dialysis [facility])    "MWF; [place]" (01/18/2017)  . HCAP (healthcare-associated pneumonia)   . Ischemic rest pain of lower extremity ([place]) 02/06/2015  . 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 NSTEMI (non-ST elevated myocardial infarction) ([place])   . Pain in lower limb 02/06/2015  . Pain in the chest   . Pleural effusion   . 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 Shortness of breath     Past Surgical History:  Procedure Laterality Date  . AV FISTULA PLACEMENT Left  06/22/2016   Procedure: ARTERIOVENOUS (AV) FISTULA CREATION LEFT UPPER ARM;  Surgeon: Rosetta Posner, MD;  Location: [place];  Service: Vascular;  Laterality: Left;  . CARDIAC CATHETERIZATION N/A 05/31/2016   Procedure: Left Heart Cath and Coronary Angiography;  Surgeon: Jettie Booze, MD;  Location: [place] [facility];  Service: Cardiovascular;  Laterality: N/A;  . CARDIAC CATHETERIZATION N/A 05/31/2016   Procedure: Right Heart Cath;  Surgeon: Jettie Booze, MD;  Location: [place] [facility];  Service: Cardiovascular;  Laterality: N/A;  . CARDIAC CATHETERIZATION N/A 05/31/2016   Procedure: IABP Insertion;  Surgeon: Jettie Booze, MD;  Location: [place] [facility];  Service: Cardiovascular;  Laterality: N/A;  . CORONARY ARTERY BYPASS GRAFT N/A 06/05/2016   Procedure: CORONARY ARTERY BYPASS GRAFTING (CABG) x3 LIMA to LAD -SVG to OM -SVG to RCA;  Surgeon: Ivin Poot, MD;  Location: [place];  Service: Open Heart Surgery;  Laterality: N/A;  . INSERTION OF DIALYSIS CATHETER N/A 06/05/2016   Procedure: INSERTION OF DIALYSIS/trialysis CATHETER;  Surgeon: Ivin Poot, MD;  Location: [place];  Service: Vascular;  Laterality: N/A;  . INSERTION OF DIALYSIS CATHETER Right 06/13/2016   Procedure: INSERTION OF DIALYSIS CATHETER RIGHT INTERNAL JUGULAR;  Surgeon: Conrad King, MD;  Location: [place];  Service: Vascular;  Laterality: Right;  . INTRAOPERATIVE TRANSESOPHAGEAL ECHOCARDIOGRAM N/A 06/05/2016   Procedure: INTRAOPERATIVE TRANSESOPHAGEAL ECHOCARDIOGRAM;  Surgeon: Ivin Poot, MD;  Location: [place];  Service: Open Heart Surgery;  Laterality: N/A;  . LEFT HEART CATH AND CORS/GRAFTS ANGIOGRAPHY N/A 11/03/2016   Procedure: Left Heart Cath and Cors/Grafts Angiography;  Surgeon: Troy Sine, MD;  Location: [place] [facility];  Service: Cardiovascular;  Laterality: N/A;     reports that he has quit smoking. He has never used smokeless tobacco. He reports that he does not drink alcohol or use  drugs.  No Known Allergies  Family History  Problem Relation Age of Onset  . CAD Father   . Colon cancer Father   . Diabetes Brother     Prior to Admission medications   Medication Sig Start Date End Date Taking? Authorizing Provider  aspirin 81 MG chewable tablet Chew 1 tablet (81 mg total) by mouth daily. 11/06/16  Yes Kenneth Griffins, MD  atorvastatin (LIPITOR) 80 MG tablet TAKE ONE TABLET BY MOUTH ONCE DAILY Patient taking differently: TAKE 80 MG BY MOUTH ONCE DAILY 03/31/17  Yes Kenneth Dresser, MD  citalopram (CELEXA) 20 MG tablet Take 20 mg by mouth daily. 02/25/17  Yes [provider]  clopidogrel (PLAVIX) 75 MG tablet Take 1 tablet (75 mg total) by mouth daily. 01/12/17  Yes [name], Kenneth Pascal, MD  ezetimibe (ZETIA) 10 MG tablet Take 1 tablet (10 mg total) by mouth daily. 02/02/17  Yes Kenneth Dresser, MD  gabapentin (NEURONTIN) 300 MG capsule Take 300 mg by mouth at bedtime. 02/11/17  Yes [provider]  hydrALAZINE (APRESOLINE) 25 MG tablet Take 0.5 tablets (12.5 mg total) by mouth 2 (two) times daily. 05/13/17 08/11/17 Yes [name], Kenneth D, NP  Insulin Human (INSULIN PUMP) SOLN Inject into the skin continuous. NOVOLOG   Yes [provider]  isosorbide mononitrate (IMDUR) 30 MG 24 hr tablet Take 1 tablet (30 mg total) by mouth daily. 04/14/17  Yes [name], Kenneth Dickinson, MD  losartan (COZAAR) 50 MG tablet Take 1 tablet (50 mg total) by mouth daily. 04/14/17  Yes [name], Kenneth Dickinson, MD  pantoprazole (PROTONIX) 40 MG tablet Take 40 mg by mouth daily.  01/07/17  Yes [provider]  metoprolol succinate (TOPROL-XL) 25 MG 24 hr tablet Take 1 tablet (25 mg total) by mouth at bedtime. Patient not taking: Reported on 07/05/2017 04/13/17   Kenneth Jansky, MD    Physical Exam: Vitals:   07/05/17 0254 07/05/17 0318 07/05/17 0319 07/05/17 0320  BP:   (!) 159/73   Pulse:      Resp:      Temp:      TempSrc:      SpO2:  99%  99%  Weight: 63.5 kg (140 lb)       Height: 5\' 4"  (1.626 m)         Constitutional: Moderately built and nourished. Vitals:   07/05/17 0254 07/05/17 0318 07/05/17 0319 07/05/17 0320  BP:   (!) 159/73   Pulse:      Resp:      Temp:      TempSrc:      SpO2:  99%  99%  Weight: 63.5 kg (140 lb)     Height: 5\' 4"  (1.626 m)      Eyes: Anicteric no pallor. ENMT: No discharge from the ears eyes nose or mouth. Neck: No mass felt.  JVD elevated. Respiratory: No rhonchi or crepitations. Cardiovascular: S1-S2 heard no murmurs appreciated. Abdomen: Soft nontender bowel sounds present. Musculoskeletal: No edema.  No joint effusion. Skin: No rash.  Skin appears warm. Neurologic: Alert awake oriented to time place and person.  Moves all extremities. Psychiatric: Appears normal.   Labs on Admission: I have personally reviewed following labs and imaging studies  CBC:  Recent Labs Lab 07/05/17 0117 07/05/17 0132  WBC 6.6  --   NEUTROABS 4.1  --   HGB 9.5* 9.9*  HCT 29.0* 29.0*  MCV 92.7  --   PLT 140*  --    Basic Metabolic Panel:  Recent Labs Lab 07/05/17 0117 07/05/17 0132  NA 134* 137  K 5.5* 5.5*  CL 100* 100*  CO2 26  --   GLUCOSE 161* 162*  BUN 38* 37*  CREATININE 6.76* 7.00*  CALCIUM 8.4*  --    GFR: Estimated Creatinine Clearance: 9.4 mL/min (A) (by C-G formula based on SCr of 7 mg/dL (H)). Liver Function Tests:  Recent Labs Lab 07/05/17 0117  AST 61*  ALT 51  ALKPHOS 118  BILITOT 0.8  PROT 7.0  ALBUMIN 3.5   No results for input(s): LIPASE, AMYLASE in the last 168 hours. No results for input(s): AMMONIA in the last 168 hours. Coagulation Profile: No results for input(s): INR, PROTIME in the last 168 hours. Cardiac Enzymes:  Recent Labs Lab 07/05/17 0117  TROPONINI 1.56*   BNP (last 3 results) No results for input(s): PROBNP in the last 8760 hours. HbA1C: No results for input(s): HGBA1C in the last 72 hours. CBG: No results for input(s): GLUCAP in the last 168  hours. Lipid Profile: No results for input(s): CHOL, HDL, LDLCALC, TRIG, CHOLHDL, LDLDIRECT in the last 72 hours. Thyroid Function Tests: No results for input(s): TSH, T4TOTAL, FREET4, T3FREE, THYROIDAB in the last 72 hours. Anemia Panel: No results for input(s): VITAMINB12, FOLATE, FERRITIN, TIBC, IRON, RETICCTPCT in the last 72 hours. Urine analysis:    Component Value Date/Time   COLORURINE YELLOW 10/30/2016 [place] 10/30/2016 2339   LABSPEC 1.010 10/30/2016 2339   PHURINE 5.0 10/30/2016 2339   GLUCOSEU NEGATIVE 10/30/2016 2339   HGBUR NEGATIVE 10/30/2016 2339   BILIRUBINUR NEGATIVE 10/30/2016 2339   KETONESUR NEGATIVE 10/30/2016 2339   PROTEINUR 100 (A) 10/30/2016 2339   NITRITE NEGATIVE 10/30/2016 2339   LEUKOCYTESUR NEGATIVE 10/30/2016 2339   Sepsis Labs: @LABRCNTIP (procalcitonin:4,lacticidven:4) )No results found for this or any previous visit (from the past 240 hour(s)).   Radiological Exams on Admission: Dg Chest Portable 1 View  Result Date: 07/05/2017 CLINICAL DATA:  Acute onset of generalized chest pain and shortness of breath. Initial encounter. EXAM: PORTABLE CHEST 1 VIEW COMPARISON:  Chest radiograph performed 04/13/2017 FINDINGS: The lungs are well-aerated. Vascular congestion is noted. Bibasilar airspace opacities raise concern for mild interstitial edema. There is no evidence of pleural effusion or pneumothorax. The cardiomediastinal silhouette is mildly enlarged. The patient is status post median sternotomy, with evidence of prior CABG. No acute osseous abnormalities are seen. IMPRESSION: Vascular congestion and mild cardiomegaly. Bibasilar airspace opacities may reflect mild interstitial edema Electronically Signed   By: Garald Balding M.D.   On: 07/05/2017 01:36    EKG: Independently reviewed.  Normal sinus rhythm with LVH and pronounced ST depression in the inferolateral leads.  Assessment/Plan Principal Problem:   Acute pulmonary edema  (HCC) Active Problems:   S/P CABG x 3   ESRD (end stage renal disease) (HCC)   CAD (coronary artery disease)   Anemia, chronic renal failure   Hypertensive emergency   Non-ST elevated myocardial infarction ([place])    1. Acute pulmonary edema -patient has been placed on BiPAP.  Will consult nephrology for emergent dialysis.  Check  2D echo for any worsening of EF.  Closely follow intake output metabolic panel and daily weights. 2. Non-ST elevation MI -cardiology has requested not to start heparin at this time until blood pressure is controlled.  We will cycle cardiac markers continue antiplatelet agents metoprolol statins.  Cardiology consult requested. 3. Hypertensive urgency -patient has been placed on IV nitroglycerin infusion.  Continue hydralazine and Cozaar and metoprolol. 4. ESRD on hemodialysis Monday Wednesday and Friday -nephrology consult for urgent dialysis. 5. Anemia secondary to ESRD -follow CBC. 6. Diabetes mellitus type 2 on insulin pump.  I have reviewed patient's old charts and labs. Patient was mildly lethargic after receiving morphine but has become more alert awake during my exam.   DVT prophylaxis: Heparin. Code Status: Full code. Family Communication: Patient's wife. Disposition Plan: Home. Consults called: Cardiology and nephrology. Admission status: Inpatient.   Rise Patience MD [facility] Pager [phone].  If 7PM-7AM, please contact night-coverage [email or web address] Password [email or web address]  07/05/2017, 5:15 AM

## 2017-07-05 NOTE — Progress Notes (Signed)
Dr Hal Hope is notified of BS this shift.

## 2017-07-05 NOTE — Progress Notes (Signed)
RT Note: Temp Corrected ABG values in system are invalid.  Correct results are below:  Ph 7.42 CO2 42.2 PO2 89 BE 3 HCO3 28 TCO2 29  So2 97%  Obtained on Bipap 12/6 30% Fio2.

## 2017-07-05 NOTE — ED Provider Notes (Signed)
[facility] EMERGENCY DEPARTMENT Provider Note   CSN: [id number] Arrival date & time: 07/05/17  0049     History   Chief Complaint Chief Complaint  Patient presents with  . Shortness of Breath    HPI Kenneth [name] is a 61 y.o. male.  Patient brought in by EMS with shortness of breath and cough over the past 2 days.  He is a Monday Wednesday Friday dialysis patient denies missing any sessions.  He does make some urine.  He complains of left-sided chest pain that radiates into his arm which is been chronic by report according to EMS.  He states that shortness of breath is new and is unable to lay flat at home.  Denies missing any dialysis sessions.  States compliance with his medications.  He is found to be hypertensive, tachypneic but not hypoxic.  Complaining of chest pain.  Has had chills but no documented fever.  Patient with multiple previous admissions for hypertensive urgency and CHF.   The history is provided by the patient, the spouse and the EMS personnel. The history is limited by the condition of the patient.  Shortness of Breath  Associated symptoms include cough and chest pain. Pertinent negatives include no fever, no headaches, no rhinorrhea, no vomiting, no abdominal pain, no rash and no leg swelling.    Past Medical History:  Diagnosis Date  . Acute pulmonary edema ([place]) 05/31/2016  . Acute respiratory failure with hypoxemia ([place]) 05/31/2016  . CHF (congestive heart failure) ([place])   . 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 ESRD (end stage renal disease) on dialysis [facility])    "MWF; [place]" (01/18/2017)  . HCAP (healthcare-associated pneumonia)   . Ischemic rest pain of lower extremity ([place]) 02/06/2015  . 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 NSTEMI (non-ST elevated myocardial infarction) ([place])   . Pain in lower limb 02/06/2015  . Pain in the chest   . Pleural effusion   . 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  Shortness of breath     Patient Active Problem List   Diagnosis Date Noted  . Severe comorbid illness   . Chronic diastolic CHF (congestive heart failure) ([place]) 04/14/2017  . Hypertensive emergency 04/14/2017  . Syncope 04/13/2017  . Anemia, chronic renal failure 04/11/2017  . Volume overload 04/11/2017  . Diabetic foot ulcer ([place]) 04/11/2017  . Hypertensive crisis   . Cellulitis in diabetic foot ([place]) 01/18/2017  . Chest pain 12/16/2016  . Abdominal pain 12/16/2016  . Age-related nuclear cataract of right eye 12/09/2016  . Pressure injury of skin 11/03/2016  . Diabetes mellitus with complication ([place]) 40/98/1191  . Elevated troponin I level 10/31/2016  . ESRD (end stage renal disease) ([place]) 10/30/2016  . CAD (coronary artery disease) 10/30/2016  . Gastroesophageal reflux disease 08/18/2016  . 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 CHF (congestive heart failure), NYHA class I, chronic, systolic ([place])   . Bursitis of left shoulder 05/31/2016  . Shortness of breath   . 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 Ischemic rest pain of lower extremity ([place]) 02/06/2015    Past Surgical History:  Procedure Laterality Date  . AV FISTULA PLACEMENT Left 06/22/2016   Procedure: ARTERIOVENOUS (AV) FISTULA CREATION LEFT UPPER ARM;  Surgeon: Rosetta Posner, MD;  Location: [facility];  Service: Vascular;  Laterality: Left;  . CARDIAC CATHETERIZATION N/A 05/31/2016   Procedure: Left  Heart Cath and Coronary Angiography;  Surgeon: Jettie Booze, MD;  Location: [facility] CV LAB;  Service: Cardiovascular;  Laterality: N/A;  . CARDIAC CATHETERIZATION N/A 05/31/2016   Procedure: Right Heart Cath;  Surgeon: Jettie Booze, MD;  Location: [facility] CV LAB;  Service: Cardiovascular;  Laterality: N/A;  . CARDIAC CATHETERIZATION N/A 05/31/2016   Procedure: IABP Insertion;  Surgeon: Jettie Booze, MD;  Location: [facility] CV LAB;  Service: Cardiovascular;  Laterality: N/A;  . CORONARY ARTERY BYPASS GRAFT N/A 06/05/2016   Procedure: CORONARY ARTERY BYPASS GRAFTING (CABG) x3 LIMA to LAD -SVG to OM -SVG to RCA;  Surgeon: Ivin Poot, MD;  Location: [facility];  Service: Open Heart Surgery;  Laterality: N/A;  . INSERTION OF DIALYSIS CATHETER N/A 06/05/2016   Procedure: INSERTION OF DIALYSIS/trialysis CATHETER;  Surgeon: Ivin Poot, MD;  Location: [facility];  Service: Vascular;  Laterality: N/A;  . INSERTION OF DIALYSIS CATHETER Right 06/13/2016   Procedure: INSERTION OF DIALYSIS CATHETER RIGHT INTERNAL JUGULAR;  Surgeon: Conrad Verdon, MD;  Location: [facility];  Service: Vascular;  Laterality: Right;  . INTRAOPERATIVE TRANSESOPHAGEAL ECHOCARDIOGRAM N/A 06/05/2016   Procedure: INTRAOPERATIVE TRANSESOPHAGEAL ECHOCARDIOGRAM;  Surgeon: Ivin Poot, MD;  Location: [facility];  Service: Open Heart Surgery;  Laterality: N/A;  . LEFT HEART CATH AND CORS/GRAFTS ANGIOGRAPHY N/A 11/03/2016   Procedure: Left Heart Cath and Cors/Grafts Angiography;  Surgeon: Troy Sine, MD;  Location: [place] CV LAB;  Service: Cardiovascular;  Laterality: N/A;       Home Medications    Prior to Admission medications   Medication Sig Start Date End Date Taking? Authorizing Provider  aspirin 81 MG chewable tablet Chew 1 tablet (81 mg total) by mouth daily. 11/06/16   Caren Griffins, MD  atorvastatin (LIPITOR) 80 MG tablet TAKE ONE TABLET BY MOUTH ONCE DAILY Patient taking differently: TAKE 80 MG BY MOUTH ONCE DAILY 03/31/17   Larey Dresser, MD  citalopram (CELEXA) 20 MG tablet Take 20 mg by mouth daily. 02/25/17   [provider]  clopidogrel (PLAVIX) 75 MG tablet Take 1 tablet (75 mg total) by mouth daily. Patient taking differently: Take 75 mg by mouth daily with lunch.  01/12/17   Bensimhon, Shaune Pascal, MD  ezetimibe (ZETIA) 10 MG tablet Take 1 tablet (10 mg total) by mouth daily. 02/02/17   Larey Dresser, MD  gabapentin  (NEURONTIN) 300 MG capsule Take 300 mg by mouth at bedtime. 02/11/17   [provider]  hydrALAZINE (APRESOLINE) 25 MG tablet Take 0.5 tablets (12.5 mg total) by mouth 2 (two) times daily. 05/13/17 08/11/17  Clegg, Amy D, NP  Insulin Human (INSULIN PUMP) SOLN Inject into the skin continuous. NOVOLOG    [provider]  isosorbide mononitrate (IMDUR) 30 MG 24 hr tablet Take 1 tablet (30 mg total) by mouth daily. 04/14/17   Hongalgi, Lenis Dickinson, MD  losartan (COZAAR) 50 MG tablet Take 1 tablet (50 mg total) by mouth daily. 04/14/17   Hongalgi, Lenis Dickinson, MD  metoprolol succinate (TOPROL-XL) 25 MG 24 hr tablet Take 1 tablet (25 mg total) by mouth at bedtime. 04/13/17   Hongalgi, Lenis Dickinson, MD  NOVOLOG 100 UNIT/ML injection 20-45 Units See admin instructions. Uses Novolog with Insulin Pump 20-45 units daily. 04/13/17   [provider]  pantoprazole (PROTONIX) 40 MG tablet Take 40 mg by mouth daily with lunch. 01/07/17   [provider]  vitamin C (ASCORBIC ACID) 500 MG tablet  Take 1,000 mg by mouth daily.    [provider]    Family History Family History  Problem Relation Age of Onset  . CAD Father   . Colon cancer Father   . Diabetes Brother     Social History Social History  Substance Use Topics  . Smoking status: Former Research scientist (life sciences)  . Smokeless tobacco: Never Used  . Alcohol use No     Allergies   Patient has no known allergies.   Review of Systems Review of Systems  Constitutional: Positive for chills and fatigue. Negative for activity change, appetite change and fever.  HENT: Negative for congestion and rhinorrhea.   Eyes: Negative for visual disturbance.  Respiratory: Positive for cough, chest tightness and shortness of breath.   Cardiovascular: Positive for chest pain. Negative for leg swelling.  Gastrointestinal: Negative for abdominal distention, abdominal pain, nausea and vomiting.  Genitourinary: Negative for dysuria, hematuria and testicular  pain.  Musculoskeletal: Negative for arthralgias and myalgias.  Skin: Negative for rash.  Neurological: Positive for weakness. Negative for dizziness and headaches.   all other systems are negative except as noted in the HPI and PMH.     Physical Exam Updated Vital Signs BP (!) 184/89 (BP Location: Right Arm)   Pulse 85   Temp 97.8 F (36.6 C) (Oral)   Resp (!) 28   SpO2 95%   Physical Exam  Constitutional: He is oriented to person, place, and time. He appears well-developed and well-nourished. He appears distressed.  Conically ill-appearing, tachypneic to the 40s  HENT:  Head: Normocephalic and atraumatic.  Mouth/Throat: Oropharynx is clear and moist. No oropharyngeal exudate.  Eyes: Pupils are equal, round, and reactive to light. Conjunctivae and EOM are normal.  Neck: Normal range of motion. Neck supple.  No meningismus.  Cardiovascular: Normal rate, regular rhythm, normal heart sounds and intact distal pulses.   No murmur heard. Pulmonary/Chest: Effort normal. No respiratory distress. He has rales. He exhibits tenderness.  Bibasilar crackles  Left-sided chest wall tenderness  Abdominal: Soft. There is no tenderness. There is no rebound and no guarding.  Musculoskeletal: Normal range of motion. He exhibits no edema or tenderness.  LUE dialysis fistula  Neurological: He is alert and oriented to person, place, and time. No cranial nerve deficit. He exhibits normal muscle tone. Coordination normal.   5/5 strength throughout. CN 2-12 intact.Equal grip strength.   Skin: Skin is warm. Capillary refill takes less than 2 seconds.  Psychiatric: He has a normal mood and affect. His behavior is normal.  Nursing note and vitals reviewed.    ED Treatments / Results  Labs (all labs ordered are listed, but only abnormal results are displayed) Labs Reviewed  CBC WITH DIFFERENTIAL/PLATELET - Abnormal; Notable for the following:       Result Value   RBC 3.13 (*)    Hemoglobin 9.5  (*)    HCT 29.0 (*)    Platelets 140 (*)    All other components within normal limits  COMPREHENSIVE METABOLIC PANEL - Abnormal; Notable for the following:    Sodium 134 (*)    Potassium 5.5 (*)    Chloride 100 (*)    Glucose, Bld 161 (*)    BUN 38 (*)    Creatinine, Ser 6.76 (*)    Calcium 8.4 (*)    AST 61 (*)    GFR calc non Af Amer 8 (*)    GFR calc Af Amer 9 (*)    All other components within normal  limits  TROPONIN I - Abnormal; Notable for the following:    Troponin I 1.56 (*)    All other components within normal limits  TROPONIN I - Abnormal; Notable for the following:    Troponin I 2.16 (*)    All other components within normal limits  CBC - Abnormal; Notable for the following:    RBC 3.00 (*)    Hemoglobin 9.4 (*)    HCT 27.7 (*)    All other components within normal limits  CREATININE, SERUM - Abnormal; Notable for the following:    Creatinine, Ser 7.01 (*)    GFR calc non Af Amer 8 (*)    GFR calc Af Amer 9 (*)    All other components within normal limits  I-STAT TROPONIN, ED - Abnormal; Notable for the following:    Troponin i, poc 1.48 (*)    All other components within normal limits  I-STAT CHEM 8, ED - Abnormal; Notable for the following:    Potassium 5.5 (*)    Chloride 100 (*)    BUN 37 (*)    Creatinine, Ser 7.00 (*)    Glucose, Bld 162 (*)    Calcium, Ion 1.01 (*)    Hemoglobin 9.9 (*)    HCT 29.0 (*)    All other components within normal limits  I-STAT ARTERIAL BLOOD GAS, ED - Abnormal; Notable for the following:    pH, Arterial 7.787 (*)    pCO2 arterial <15.0 (*)    pO2, Arterial 19.0 (*)    Acid-Base Excess 3.0 (*)    All other components within normal limits  CULTURE, BLOOD (ROUTINE X 2)  CULTURE, BLOOD (ROUTINE X 2)  TROPONIN I  TROPONIN I  I-STAT CG4 LACTIC ACID, ED  I-STAT CG4 LACTIC ACID, ED  Was  EKG  EKG Interpretation  Date/Time:  Monday July 05 2017 00:50:31 EDT Ventricular Rate:  87 PR Interval:    QRS  Duration: 110 QT Interval:  403 QTC Calculation: 485 R Axis:   92 Text Interpretation:  Sinus rhythm Probable left atrial enlargement Right axis deviation Consider left ventricular hypertrophy Repol abnrm, severe global ischemia (LM/MVD) more pronounced ST depressions inferior and laterally d/w Dr. Harrell Gave Confirmed by Ezequiel Essex [phone]) on 07/05/2017 1:17:10 AM       Radiology Ct Head Wo Contrast  Result Date: 07/05/2017 CLINICAL DATA:  Acute onset of altered mental status. Shortness of breath. Initial encounter. EXAM: CT HEAD WITHOUT CONTRAST TECHNIQUE: Contiguous axial images were obtained from the base of the skull through the vertex without intravenous contrast. COMPARISON:  CT of the head performed 04/13/2017 FINDINGS: Brain: No evidence of acute infarction, hemorrhage, hydrocephalus, extra-axial collection or mass lesion/mass effect. The posterior fossa, including the cerebellum, brainstem and fourth ventricle, is within normal limits. The third and lateral ventricles, and basal ganglia are unremarkable in appearance. The cerebral hemispheres are symmetric in appearance, with normal gray-white differentiation. No mass effect or midline shift is seen. Vascular: No hyperdense vessel or unexpected calcification. Skull: There is no evidence of fracture; visualized osseous structures are unremarkable in appearance. Sinuses/Orbits: The visualized portions of the orbits are within normal limits. The paranasal sinuses and mastoid air cells are well-aerated. Other: No significant soft tissue abnormalities are seen. IMPRESSION: Unremarkable noncontrast CT of the head. Electronically Signed   By: Garald Balding M.D.   On: 07/05/2017 06:37   Dg Chest Portable 1 View  Result Date: 07/05/2017 CLINICAL DATA:  Acute onset of generalized chest pain and  shortness of breath. Initial encounter. EXAM: PORTABLE CHEST 1 VIEW COMPARISON:  Chest radiograph performed 04/13/2017 FINDINGS: The lungs are  well-aerated. Vascular congestion is noted. Bibasilar airspace opacities raise concern for mild interstitial edema. There is no evidence of pleural effusion or pneumothorax. The cardiomediastinal silhouette is mildly enlarged. The patient is status post median sternotomy, with evidence of prior CABG. No acute osseous abnormalities are seen. IMPRESSION: Vascular congestion and mild cardiomegaly. Bibasilar airspace opacities may reflect mild interstitial edema Electronically Signed   By: Garald Balding M.D.   On: 07/05/2017 01:36  Is  Procedures Procedures (including critical care time)  Medications Ordered in ED Medications  ipratropium-albuterol (DUONEB) 0.5-2.5 (3) MG/3ML nebulizer solution 3 mL (not administered)  nitroGLYCERIN (NITROGLYN) 2 % ointment 1 inch (not administered)  morphine 4 MG/ML injection 4 mg (not administered)     Initial Impression / Assessment and Plan / ED Course  I have reviewed the triage vital signs and the nursing notes.  Pertinent labs & imaging results that were available during my care of the patient were reviewed by me and considered in my medical decision making (see chart for details).    ESRD patient with CAD, HTN, CHF presenting with SOB and cough over the past 2 days.  Has chest pain which is chronic.  ST depressions similar to previous d/w Dr. Harrell Gave who agrees morphology is similar. She agrees not consistent with STEMI.  Placed on BiPAP on arrival.  Also given IV nitroglycerin and morphine for suspected pulmonary edema and hypertensive emergency.  Nephrology contacted for emergent dialysis given hyperkalemia and pulmonary edema.  IV NTG, Morphine.   Multiple calls to nephrology Dr. Jonnie Finner not returned.  The results entered incorrectly in computer.  Correct ABG is 7.429, PCO2 42, PO2 89, bicarb 28  Patient became Somewhat somnolent after morphine administration. CT head negative. ABG without CO2 retention.   Multiple calls to nephrology  still have not is been returned.  Discussed with internal medicine Dr. Hal Hope who will admit to stepdown.  CRITICAL CARE Performed by: Ezequiel Essex Total critical care time: 60 minutes Critical care time was exclusive of separately billable procedures and treating other patients. Critical care was necessary to treat or prevent imminent or life-threatening deterioration. Critical care was time spent personally by me on the following activities: development of treatment plan with patient and/or surrogate as well as nursing, discussions with consultants, evaluation of patient's response to treatment, examination of patient, obtaining history from patient or surrogate, ordering and performing treatments and interventions, ordering and review of laboratory studies, ordering and review of radiographic studies, pulse oximetry and re-evaluation of patient's condition.  Final Clinical Impressions(s) / ED Diagnoses   Final diagnoses:  Acute pulmonary edema [facility])  Hypertensive emergency    New Prescriptions New Prescriptions   No medications on file     Ezequiel Essex, MD 07/05/17 [phone]

## 2017-07-05 NOTE — ED Triage Notes (Addendum)
Ems pt from home initial call out for shortness of breath, with productive cough and chest pain. Pt did not go to dialysis today

## 2017-07-05 NOTE — Procedures (Signed)
I have personally attended this patient's dialysis session.   On BIPAP and seems comfortable Unable to stand for weight UF goal to 2-3 liters (JVD/1+ peripheral edema/rales both bases) 2K bath for K 5.5 Giving dose of Aranesp 100 today for Hb 9.9   Jamal Maes, MD [facility] [phone] Pager 07/05/2017, 8:25 AM

## 2017-07-05 NOTE — Consult Note (Signed)
[facility] Consultation Note Requesting Physician:  EDP Primary Nephrologist: Moshe Cipro [facility]) Reason for Consult:  Provision of ESRD related services, acute HD for pulmonary edema.   HPI: The patient is a 61 y.o. year-old man with ESRD (MWF HD [place]), CAD, prior CABG, DM (insulin pump). Presented with SOB/CP. + trops/evidence NSTEMI. Pulmonary edema requiring BIPAP. We were asked to see to provide acute HD.  Pt had not missed any HD sessions. Had CP on and off with both rest and exertion, along with SOB.   Past Medical History:  Diagnosis Date  . Acute pulmonary edema ([place]) 05/31/2016  . Acute respiratory failure with hypoxemia ([place]) 05/31/2016  . CHF (congestive heart failure) ([place])   . 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 ESRD (end stage renal disease) on dialysis [facility])    "MWF; [place]" (01/18/2017)  . HCAP (healthcare-associated pneumonia)   . Ischemic rest pain of lower extremity ([place]) 02/06/2015  . 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 NSTEMI (non-ST elevated myocardial infarction) ([place])   . Pain in lower limb 02/06/2015  . Pain in the chest   . Pleural effusion   . 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 Shortness of breath     Past Surgical History:  Procedure Laterality Date  . AV FISTULA PLACEMENT Left 06/22/2016   Procedure: ARTERIOVENOUS (AV) FISTULA CREATION LEFT UPPER ARM;  Surgeon: Rosetta Posner, MD;  Location: [place];  Service: Vascular;  Laterality: Left;  . CARDIAC CATHETERIZATION N/A 05/31/2016   Procedure: Left Heart Cath and Coronary Angiography;  Surgeon: Jettie Booze, MD;  Location: [place] CV LAB;  Service: Cardiovascular;  Laterality: N/A;  . CARDIAC CATHETERIZATION N/A 05/31/2016   Procedure: Right Heart Cath;  Surgeon: Jettie Booze, MD;  Location: [place] CV LAB;  Service: Cardiovascular;  Laterality: N/A;  . CARDIAC CATHETERIZATION N/A 05/31/2016   Procedure: IABP Insertion;  Surgeon:  Jettie Booze, MD;  Location: [place] CV LAB;  Service: Cardiovascular;  Laterality: N/A;  . CORONARY ARTERY BYPASS GRAFT N/A 06/05/2016   Procedure: CORONARY ARTERY BYPASS GRAFTING (CABG) x3 LIMA to LAD -SVG to OM -SVG to RCA;  Surgeon: Ivin Poot, MD;  Location: [place];  Service: Open Heart Surgery;  Laterality: N/A;  . INSERTION OF DIALYSIS CATHETER N/A 06/05/2016   Procedure: INSERTION OF DIALYSIS/trialysis CATHETER;  Surgeon: Ivin Poot, MD;  Location: [place];  Service: Vascular;  Laterality: N/A;  . INSERTION OF DIALYSIS CATHETER Right 06/13/2016   Procedure: INSERTION OF DIALYSIS CATHETER RIGHT INTERNAL JUGULAR;  Surgeon: Conrad Los Banos, MD;  Location: [place];  Service: Vascular;  Laterality: Right;  . INTRAOPERATIVE TRANSESOPHAGEAL ECHOCARDIOGRAM N/A 06/05/2016   Procedure: INTRAOPERATIVE TRANSESOPHAGEAL ECHOCARDIOGRAM;  Surgeon: Ivin Poot, MD;  Location: [place];  Service: Open Heart Surgery;  Laterality: N/A;  . LEFT HEART CATH AND CORS/GRAFTS ANGIOGRAPHY N/A 11/03/2016   Procedure: Left Heart Cath and Cors/Grafts Angiography;  Surgeon: Troy Sine, MD;  Location: [place] CV LAB;  Service: Cardiovascular;  Laterality: N/A;    Family History  Problem Relation Age of Onset  . CAD Father   . Colon cancer Father   . Diabetes Brother    Social History:  reports that he has quit smoking. He has never used smokeless tobacco. He reports that he does not drink alcohol or use drugs.  Allergies: No Known Allergies  Home medications: Prior to Admission medications   Medication Sig Start Date End  Date Taking? Authorizing Provider  aspirin 81 MG chewable tablet Chew 1 tablet (81 mg total) by mouth daily. 11/06/16  Yes Caren Griffins, MD  atorvastatin (LIPITOR) 80 MG tablet TAKE ONE TABLET BY MOUTH ONCE DAILY Patient taking differently: TAKE 80 MG BY MOUTH ONCE DAILY 03/31/17  Yes Larey Dresser, MD  citalopram (CELEXA) 20 MG tablet Take 20 mg by mouth daily. 02/25/17   Yes [provider]  clopidogrel (PLAVIX) 75 MG tablet Take 1 tablet (75 mg total) by mouth daily. 01/12/17  Yes Bensimhon, Shaune Pascal, MD  ezetimibe (ZETIA) 10 MG tablet Take 1 tablet (10 mg total) by mouth daily. 02/02/17  Yes Larey Dresser, MD  gabapentin (NEURONTIN) 300 MG capsule Take 300 mg by mouth at bedtime. 02/11/17  Yes [provider]  hydrALAZINE (APRESOLINE) 25 MG tablet Take 0.5 tablets (12.5 mg total) by mouth 2 (two) times daily. 05/13/17 08/11/17 Yes Clegg, Amy D, NP  Insulin Human (INSULIN PUMP) SOLN Inject into the skin continuous. NOVOLOG   Yes [provider]  isosorbide mononitrate (IMDUR) 30 MG 24 hr tablet Take 1 tablet (30 mg total) by mouth daily. 04/14/17  Yes Hongalgi, Lenis Dickinson, MD  losartan (COZAAR) 50 MG tablet Take 1 tablet (50 mg total) by mouth daily. 04/14/17  Yes Hongalgi, Lenis Dickinson, MD  pantoprazole (PROTONIX) 40 MG tablet Take 40 mg by mouth daily.  01/07/17  Yes [provider]  metoprolol succinate (TOPROL-XL) 25 MG 24 hr tablet Take 1 tablet (25 mg total) by mouth at bedtime. Patient not taking: Reported on 07/05/2017 04/13/17   Modena Jansky, MD    Inpatient medications: . aspirin  81 mg Oral Daily  . atorvastatin  80 mg Oral q1800  . citalopram  20 mg Oral Daily  . clopidogrel  75 mg Oral Daily  . darbepoetin (ARANESP) injection - DIALYSIS  100 mcg Intravenous Q Mon-HD  . doxercalciferol  2 mcg Intravenous Q M,W,F-HD  . ezetimibe  10 mg Oral Daily  . gabapentin  300 mg Oral QHS  . heparin  5,000 Units Subcutaneous Q8H  . hydrALAZINE  12.5 mg Oral BID  . insulin pump   Subcutaneous Q4H  . isosorbide mononitrate  30 mg Oral Daily  . losartan  50 mg Oral Daily  . pantoprazole  40 mg Oral Daily    Review of Systems Difficult 2/2 pt on BIPAP. Much of history from record. Pt could nod yes and no but too dyspneic on BIPAP to try to talk  Physical Exam:  Blood pressure (!) 150/61, pulse 72, temperature 98 F (36.7 C),  temperature source Oral, resp. rate 17, height 5\' 4"  (1.626 m), weight 63.5 kg (140 lb), SpO2 100 %.  Gen: Acutely ill appearing Asian male, on BIPAP Skin: no rash, cyanosis Neck: + JCD  Chest: diffuse crackles bilaterally esp bases Heart: S1S2 No S3 Cannot appreciate murmur (resp noise) Abdomen: soft, no focal tenderness Ext: 1+ edema Dialysis Access: AVF cannulated for HD no issues   Recent Labs Lab 07/05/17 0117 07/05/17 0132 07/05/17 0630  NA 134* 137  --   K 5.5* 5.5*  --   CL 100* 100*  --   CO2 26  --   --   GLUCOSE 161* 162*  --   BUN 38* 37*  --   CREATININE 6.76* 7.00* 7.01*  CALCIUM 8.4*  --   --     Recent Labs Lab 07/05/17 0117  AST 61*  ALT 51  ALKPHOS 118  BILITOT 0.8  PROT 7.0  ALBUMIN 3.5    Recent Labs Lab 07/05/17 0117 07/05/17 0132 07/05/17 0630  WBC 6.6  --  6.9  NEUTROABS 4.1  --   --   HGB 9.5* 9.9* 9.4*  HCT 29.0* 29.0* 27.7*  MCV 92.7  --  92.3  PLT 140*  --  159     Recent Labs Lab 07/05/17 0757 07/05/17 1407  GLUCAP 99 74    Iron Studies: No results for input(s): IRON, TIBC, TRANSFERRIN, FERRITIN in the last 168 hours.  Xrays/Other Studies: Ct Head Wo Contrast  Result Date: 07/05/2017 CLINICAL DATA:  Acute onset of altered mental status. Shortness of breath. Initial encounter. EXAM: CT HEAD WITHOUT CONTRAST TECHNIQUE: Contiguous axial images were obtained from the base of the skull through the vertex without intravenous contrast. COMPARISON:  CT of the head performed 04/13/2017 FINDINGS: Brain: No evidence of acute infarction, hemorrhage, hydrocephalus, extra-axial collection or mass lesion/mass effect. The posterior fossa, including the cerebellum, brainstem and fourth ventricle, is within normal limits. The third and lateral ventricles, and basal ganglia are unremarkable in appearance. The cerebral hemispheres are symmetric in appearance, with normal gray-white differentiation. No mass effect or midline shift is seen.  Vascular: No hyperdense vessel or unexpected calcification. Skull: There is no evidence of fracture; visualized osseous structures are unremarkable in appearance. Sinuses/Orbits: The visualized portions of the orbits are within normal limits. The paranasal sinuses and mastoid air cells are well-aerated. Other: No significant soft tissue abnormalities are seen. IMPRESSION: Unremarkable noncontrast CT of the head. Electronically Signed   By: Garald Balding M.D.   On: 07/05/2017 06:37   Dg Chest Portable 1 View  Result Date: 07/05/2017 CLINICAL DATA:  Acute onset of generalized chest pain and shortness of breath. Initial encounter. EXAM: PORTABLE CHEST 1 VIEW COMPARISON:  Chest radiograph performed 04/13/2017 FINDINGS: The lungs are well-aerated. Vascular congestion is noted. Bibasilar airspace opacities raise concern for mild interstitial edema. There is no evidence of pleural effusion or pneumothorax. The cardiomediastinal silhouette is mildly enlarged. The patient is status post median sternotomy, with evidence of prior CABG. No acute osseous abnormalities are seen. IMPRESSION: Vascular congestion and mild cardiomegaly. Bibasilar airspace opacities may reflect mild interstitial edema Electronically Signed   By: Garald Balding M.D.   On: 07/05/2017 01:36   Usual HD orders MWF [place] 4 hours 400/800 2K2.5 Ca AVF EDW 64 kg Hectorol 2 mcg Heparin 4500 bolus Mircera 75 mcg Q2weeks (last Hb 9.9 will give dose of Aranesp with HD today)  Background:  61 y.o. year-old man with ESRD (MWF HD [place]), CAD, prior CABG, DM (insulin pump). Presented with SOB/CP. + trops/evidence NSTEMI. Pulmonary edema requiring BIPAP. We were asked to see to provide acute HD.  Pt had not missed any HD sessions. Had CP on and off with both rest and exertion, along with SOB.   Assessment/Recommendations  1. ESRD - usual MWF HD. Urgent HD today for pulmonary edema. No missed HD sessions. 2. NSTEMI - Cards to see.  cycling cardiac markers.  3. Pulmonary edema - UF goal with HD 2-3 liters as BP will tolerate. Unable to stand for weight so not sure how far off pt is from EDW, is not usually able to get to EDW of 64; was 65 post last outpt Rx. Wean BIPAP as able. 4. HT urgency - Required IV nitro. Volume removal should help. Wean nitro as able 5. 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 Dosed with  Aranesp 100 at HD today. 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 Check phos. Confirm binder doses. 7. IDDM - insulin pump. Primary managing DM  Jamal Maes,  MD [facility] [phone] pager 07/05/2017, 3:23 PM

## 2017-07-05 NOTE — Progress Notes (Signed)
Patient transported on BiPAP to HD. Vitals stable.

## 2017-07-05 NOTE — Progress Notes (Signed)
Patient seen and examined, database reviewed.  Patient was admitted earlier today from home due to shortness of breath and chest pain.  He has a history of end-stage renal disease and dialyzes on Monday Wednesday and Friday.  He has been coughing.  Was found to have acute pulmonary edema and is already undergone emergent hemodialysis.  He was also found to have a non-STEMI with troponin of 2.16.  Cardiology was consulted by ED Recommended initiation of heparin at this time.  Continue to cycle cardiac enzymes, continue metoprolol, statin.  Formal cardiology consultation is pending.  Patient was on IV nitroglycerin for hypertensive urgency.  He also has a history of insulin-dependent diabetes on an insulin pump.  We will continue to follow closely.  [name], MD [facility] Pager: [phone]

## 2017-07-05 NOTE — Progress Notes (Signed)
[facility] Brief Note (Full consult note to follow)  Pt from [place] Usual HD MWF  Admitted with NSTEMI/pulm edema On BIPAP Will provide urgent HD this AM (last HD 10/19, stayed full time, EDW 64, got to 65 kg usu not to EDW)  Usual HD orders MWF 4 hours 400/800 2K2.5 Ca AVF EDW 64 kg Hectorol 2 mcg Heparin 4500 bolus Mircera 75 mcg Q2weeks (last Hb 9.9 will give dose of Aranesp with HD today)   Jamal Maes, MD [facility] [phone] Pager 07/05/2017, 7:35 AM

## 2017-07-05 NOTE — Progress Notes (Addendum)
Patient c/o CP rated 7 out of 10, VS are 103/49 HR is 71 98% on 2 l [place]. Notified Dr Hal Hope per text page

## 2017-07-05 NOTE — ED Notes (Signed)
Critical lab value communicated to [place], MD.

## 2017-07-05 NOTE — Progress Notes (Signed)
Inpatient Diabetes Program Recommendations  AACE/ADA: New Consensus Statement on Inpatient Glycemic Control (2015)  Target Ranges:  Prepandial:   less than 140 mg/dL      Peak postprandial:   less than 180 mg/dL (1-2 hours)      Critically ill patients:  140 - 180 mg/dL   Lab Results  Component Value Date   GLUCAP 74 07/05/2017   HGBA1C 5.8 (H) 04/12/2017    Review of Glycemic ControlResults for KAHEEM, HALLECK (MRN [id number]) as of 07/05/2017 14:52  Ref. Range 07/05/2017 07:57 07/05/2017 14:07  Glucose-Capillary Latest Ref Range: 65 - 99 mg/dL 99 74    Diabetes history: Type 1 diabetes Outpatient Diabetes medications: [employer]tr[employer] Insulin pump Basal settings:  Total basal=19.2 units/24 hours 12a-0.7 units/hr 6a- 0.85 units/hr 10p- 0.7 units/hr CHO ratio=1 unit for every 12 grams of CHO 12a-11a                    1 unit for every 10 grams of CHO 11a-12p Correction= 1 unit for every 60 mg/dL >100-120 mg/dL Current orders for Inpatient glycemic control:  Insulin pump per home settings  Inpatient Diabetes Program Recommendations:    Spoke with patient and wife regarding insulin pump.  He states that he changed site yesterday.  Encouraged wife to bring insulin pump supplies to the hospital as well just in case.  Discussed insulin pump policy with RN including contract and flowsheet.  RN states that she is getting ready to call MD regarding diet order.  Patient see's Dr. Hartford Poli in [place] for diabetes.   Thanks, Adah Perl, RN, BC-ADM Inpatient Diabetes Coordinator Pager [phone] (8a-5p)

## 2017-07-05 NOTE — Progress Notes (Signed)
Patient transported to CT scan via BIPAP and returned to A09 without any incident. Continues on BIPAP. No distress noted.

## 2017-07-05 NOTE — Progress Notes (Signed)
Patient is currently on room air with sats of 94%. Patient is in no distress. BIPAP is not needed at this time. Will continue to monitor.

## 2017-07-05 NOTE — Consult Note (Signed)
Advanced Heart Failure Team Consult Note   Primary Physician: Dr. Tasia Catchings Primary Cardiologist:  Dr. Aundra Dubin   Reason for Consultation: Pulmonary Edema/CHF  HPI:    Kenneth [name] is seen today for evaluation of Pulmonary Edema/CHF at the request of Dr. Jaxten Brosh is a 61 y.o. male with h/o of ESRD on HD, CAD s/p CABG x 3 05/2016, Transient Post-op Afib, DM2, and HTN.  He is on a M-W-F dialysis schedule.   He was admitted in 9/17 with NSTEMI.  LHC showed 3VD and he had CABG x 3.  He had CKD stage IV when he was admitted and developed ESRD requiring HD while in the hospital.  He had transient atrial fibrillation post-CABG.  He developed cardiogenic shock requiring milrinone but was able to taper off.  Echo in 9/17 showed EF 35-40%.    Subsequent echos 1/18 and 4/18 with EF 60% and moderate LVH,   Last seen in HF clinic 05/13/17 and thought to be stable. Hydralazine added that day for BP control.   Pt presented to ED 07/05/17 with respiratory distress and CP. Found to have hypertension (Systolic in 782N) and Troponin elevation to 2.1 on arrival to ED. EKG with more pronounced inferolateral ST depression.  CXR with vascular congestion and mild interstitial edema.   PT started on BiPAP.  CT head ordered with altered mental status and elevated HTN. This was unremarkable.  Pertinent labs on admission include K 5.5, Creatinine 7.01, Hgb 9.4, WBC 6.9, and Troponin 2.16.  Pt feeling better now off NTG drip and after ~ 3L off from dialysis.  Denies SOB at present. Remains very fatigued. States he has felt bad for about a week, with subjective fever and productive cough with yellow sputum at home over that same time. Denies sick contacts. Has been taking all medications as directed and has not missed any dialysis. Denies lightheadedness or dizziness. No SOB walking around the house or store at baseline, but SOB with minimal activity since last night. Has a slight headache currently.   Review of  Systems: [y] = yes, [ ]  = no   General: Weight gain [ ] ; Weight loss [ ] ; Anorexia [ ] ; Fatigue [y]; Fever Blue.Reese ]; Chills [ ] ; Weakness [ ]   Cardiac: Chest pain/pressure Blue.Reese ]; Resting SOB Blue.Reese ]; Exertional SOB [y]; Orthopnea [ ] ; Pedal Edema [ y]; Palpitations [ ] ; Syncope [ ] ; Presyncope [ ] ; Paroxysmal nocturnal dyspnea[y ]  Pulmonary: Cough [y]; Wheezing[ ] ; Hemoptysis[ ] ; Sputum Blue.Reese ]; Snoring [ ]   GI: Vomiting[ ] ; Dysphagia[ ] ; Melena[ ] ; Hematochezia [ ] ; Heartburn[ ] ; Abdominal pain [ ] ; Constipation [ ] ; Diarrhea [ ] ; BRBPR [ ]   GU: Hematuria[ ] ; Dysuria [ ] ; Nocturia[ ]   Vascular: Pain in legs with walking [ ] ; Pain in feet with lying flat [ ] ; Non-healing sores [ ] ; Stroke [ ] ; TIA [ ] ; Slurred speech [ ] ;  Neuro: Headaches[ ] ; Vertigo[ ] ; Seizures[ ] ; Paresthesias[ ] ;Blurred vision [ ] ; Diplopia [ ] ; Vision changes [ ]   Ortho/Skin: Arthritis Blue.Reese ]; Joint pain Blue.Reese ]; Muscle pain [ ] ; Joint swelling [ ] ; Back Pain [ ] ; Rash [ ]   Psych: Depression[ ] ; Anxiety[ ]   Heme: Bleeding problems [ ] ; Clotting disorders [ ] ; Anemia Blue.Reese ]  Endocrine: Diabetes [y]; Thyroid dysfunction[ ]   Home Medications Prior to Admission medications   Medication Sig Start Date End Date Taking? Authorizing Provider  aspirin 81 MG chewable tablet Chew  1 tablet (81 mg total) by mouth daily. 11/06/16  Yes Caren Griffins, MD  atorvastatin (LIPITOR) 80 MG tablet TAKE ONE TABLET BY MOUTH ONCE DAILY Patient taking differently: TAKE 80 MG BY MOUTH ONCE DAILY 03/31/17  Yes Larey Dresser, MD  citalopram (CELEXA) 20 MG tablet Take 20 mg by mouth daily. 02/25/17  Yes [provider]  clopidogrel (PLAVIX) 75 MG tablet Take 1 tablet (75 mg total) by mouth daily. 01/12/17  Yes Bensimhon, Shaune Pascal, MD  ezetimibe (ZETIA) 10 MG tablet Take 1 tablet (10 mg total) by mouth daily. 02/02/17  Yes Larey Dresser, MD  gabapentin (NEURONTIN) 300 MG capsule Take 300 mg by mouth at bedtime. 02/11/17  Yes [provider]   hydrALAZINE (APRESOLINE) 25 MG tablet Take 0.5 tablets (12.5 mg total) by mouth 2 (two) times daily. 05/13/17 08/11/17 Yes Clegg, Amy D, NP  Insulin Human (INSULIN PUMP) SOLN Inject into the skin continuous. NOVOLOG   Yes [provider]  isosorbide mononitrate (IMDUR) 30 MG 24 hr tablet Take 1 tablet (30 mg total) by mouth daily. 04/14/17  Yes Hongalgi, Lenis Dickinson, MD  losartan (COZAAR) 50 MG tablet Take 1 tablet (50 mg total) by mouth daily. 04/14/17  Yes Hongalgi, Lenis Dickinson, MD  pantoprazole (PROTONIX) 40 MG tablet Take 40 mg by mouth daily.  01/07/17  Yes [provider]  metoprolol succinate (TOPROL-XL) 25 MG 24 hr tablet Take 1 tablet (25 mg total) by mouth at bedtime. Patient not taking: Reported on 07/05/2017 04/13/17   Modena Jansky, MD    Past Medical History: Past Medical History:  Diagnosis Date  . Acute pulmonary edema ([place]) 05/31/2016  . Acute respiratory failure with hypoxemia ([place]) 05/31/2016  . CHF (congestive heart failure) ([place])   . 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[place]) 02/06/2015  . ESRD (end stage renal disease) on dialysis [facility])    "MWF; [place]" (01/18/2017)  . HCAP (healthcare-associated pneumonia)   . Ischemic rest pain of lower extremity ([place]) 02/06/2015  . 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 NSTEMI (non-ST elevated myocardial infarction) ([place])   . Pain in lower limb 02/06/2015  . Pain in the chest   . Pleural effusion   . 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 Shortness of breath     Past Surgical History: Past Surgical History:  Procedure Laterality Date  . AV FISTULA PLACEMENT Left 06/22/2016   Procedure: ARTERIOVENOUS (AV) FISTULA CREATION LEFT UPPER ARM;  Surgeon: Rosetta Posner, MD;  Location: [place];  Service: Vascular;  Laterality: Left;  . CARDIAC CATHETERIZATION N/A 05/31/2016   Procedure: Left Heart Cath and Coronary Angiography;  Surgeon: Jettie Booze, MD;  Location: [place] CV LAB;  Service: Cardiovascular;   Laterality: N/A;  . CARDIAC CATHETERIZATION N/A 05/31/2016   Procedure: Right Heart Cath;  Surgeon: Jettie Booze, MD;  Location: [place] CV LAB;  Service: Cardiovascular;  Laterality: N/A;  . CARDIAC CATHETERIZATION N/A 05/31/2016   Procedure: IABP Insertion;  Surgeon: Jettie Booze, MD;  Location: [place] CV LAB;  Service: Cardiovascular;  Laterality: N/A;  . CORONARY ARTERY BYPASS GRAFT N/A 06/05/2016   Procedure: CORONARY ARTERY BYPASS GRAFTING (CABG) x3 LIMA to LAD -SVG to OM -SVG to RCA;  Surgeon: Ivin Poot, MD;  Location: [place];  Service: Open Heart Surgery;  Laterality: N/A;  . INSERTION OF DIALYSIS CATHETER N/A 06/05/2016   Procedure: INSERTION OF DIALYSIS/trialysis CATHETER;  Surgeon: Ivin Poot, MD;  [facility];  Service: Vascular;  Laterality: N/A;  . INSERTION OF DIALYSIS CATHETER Right 06/13/2016   Procedure: INSERTION OF DIALYSIS CATHETER RIGHT INTERNAL JUGULAR;  Surgeon: Conrad Carlisle, MD;  Location: [place];  Service: Vascular;  Laterality: Right;  . INTRAOPERATIVE TRANSESOPHAGEAL ECHOCARDIOGRAM N/A 06/05/2016   Procedure: INTRAOPERATIVE TRANSESOPHAGEAL ECHOCARDIOGRAM;  Surgeon: Ivin Poot, MD;  Location: [place];  Service: Open Heart Surgery;  Laterality: N/A;  . LEFT HEART CATH AND CORS/GRAFTS ANGIOGRAPHY N/A 11/03/2016   Procedure: Left Heart Cath and Cors/Grafts Angiography;  Surgeon: Troy Sine, MD;  Location: [place] CV LAB;  Service: Cardiovascular;  Laterality: N/A;    Family History: Family History  Problem Relation Age of Onset  . CAD Father   . Colon cancer Father   . Diabetes Brother     Social History: Social History   Social History  . Marital status: Married    Spouse name: N/A  . Number of children: N/A  . Years of education: N/A   Social History Main Topics  . Smoking status: Former Research scientist (life sciences)  . Smokeless tobacco: Never Used  . Alcohol use No  . Drug use: No  . Sexual activity: Not Asked   Other Topics  Concern  . None   Social History Narrative  . None    Allergies:  No Known Allergies  Objective:    Vital Signs:   Temp:  [97.8 F (36.6 C)-98 F (36.7 C)] 98 F (36.7 C) (10/22 1404) Pulse Rate:  [59-85] 72 (10/22 1404) Resp:  [9-41] 17 (10/22 1404) BP: (84-184)/(39-89) 150/61 (10/22 1404) SpO2:  [95 %-100 %] 100 % (10/22 1404) FiO2 (%):  [30 %] 30 % (10/22 0802) Weight:  [140 lb (63.5 kg)] 140 lb (63.5 kg) (10/22 0254)   Weight change: Filed Weights   07/05/17 0254  Weight: 140 lb (63.5 kg)   Intake/Output:   Intake/Output Summary (Last 24 hours) at 07/05/17 1509 Last data filed at 07/05/17 1245  Gross per 24 hour  Intake                0 ml  Output             2740 ml  Net            -2740 ml      Physical Exam    General:  Fatigued and ill appearing. No resp difficulty HEENT: normal Neck: supple. JVP 8-9cm +. Carotids 2+ bilat; no bruits. No lymphadenopathy or thyromegaly appreciated. Cor: PMI nondisplaced. Regular rate & rhythm. 1/6 TR.  Lungs: Diminished basilar sounds with basilar crackles Abdomen: soft, nontender, nondistended. No hepatosplenomegaly. No bruits or masses. Good bowel sounds. Extremities: no cyanosis, clubbing, or rash. Trace ankle edema. +AVF Neuro: alert & orientedx3, cranial nerves grossly intact. moves all 4 extremities w/o difficulty. Affect pleasant Skin: Warm -> almost hot to the touch.   Telemetry   NSR, Personally reviewed  EKG    NSR 87 bpm,ST depression in V5-V6 but more consistent with strain, Personally reviewed.  Labs   Basic Metabolic Panel:  Recent Labs Lab 07/05/17 0117 07/05/17 0132 07/05/17 0630  NA 134* 137  --   K 5.5* 5.5*  --   CL 100* 100*  --   CO2 26  --   --   GLUCOSE 161* 162*  --   BUN 38* 37*  --   CREATININE 6.76* 7.00* 7.01*  CALCIUM 8.4*  --   --     Liver Function  Tests:  Recent Labs Lab 07/05/17 0117  AST 61*  ALT 51  ALKPHOS 118  BILITOT 0.8  PROT 7.0  ALBUMIN 3.5   No  results for input(s): LIPASE, AMYLASE in the last 168 hours. No results for input(s): AMMONIA in the last 168 hours.  CBC:  Recent Labs Lab 07/05/17 0117 07/05/17 0132 07/05/17 0630  WBC 6.6  --  6.9  NEUTROABS 4.1  --   --   HGB 9.5* 9.9* 9.4*  HCT 29.0* 29.0* 27.7*  MCV 92.7  --  92.3  PLT 140*  --  159    Cardiac Enzymes:  Recent Labs Lab 07/05/17 0117 07/05/17 0630  TROPONINI 1.56* 2.16*    BNP: BNP (last 3 results)  Recent Labs  10/30/16 2024 12/15/16 2148 04/11/17 1529  BNP 1,900.0* 881.1* 1,724.5*    ProBNP (last 3 results) No results for input(s): PROBNP in the last 8760 hours.   CBG:  Recent Labs Lab 07/05/17 0757 07/05/17 1407  GLUCAP 99 74    Coagulation Studies: No results for input(s): LABPROT, INR in the last 72 hours.   Imaging   Ct Head Wo Contrast  Result Date: 07/05/2017 CLINICAL DATA:  Acute onset of altered mental status. Shortness of breath. Initial encounter. EXAM: CT HEAD WITHOUT CONTRAST TECHNIQUE: Contiguous axial images were obtained from the base of the skull through the vertex without intravenous contrast. COMPARISON:  CT of the head performed 04/13/2017 FINDINGS: Brain: No evidence of acute infarction, hemorrhage, hydrocephalus, extra-axial collection or mass lesion/mass effect. The posterior fossa, including the cerebellum, brainstem and fourth ventricle, is within normal limits. The third and lateral ventricles, and basal ganglia are unremarkable in appearance. The cerebral hemispheres are symmetric in appearance, with normal gray-white differentiation. No mass effect or midline shift is seen. Vascular: No hyperdense vessel or unexpected calcification. Skull: There is no evidence of fracture; visualized osseous structures are unremarkable in appearance. Sinuses/Orbits: The visualized portions of the orbits are within normal limits. The paranasal sinuses and mastoid air cells are well-aerated. Other: No significant soft tissue  abnormalities are seen. IMPRESSION: Unremarkable noncontrast CT of the head. Electronically Signed   By: Garald Balding M.D.   On: 07/05/2017 06:37   Dg Chest Portable 1 View  Result Date: 07/05/2017 CLINICAL DATA:  Acute onset of generalized chest pain and shortness of breath. Initial encounter. EXAM: PORTABLE CHEST 1 VIEW COMPARISON:  Chest radiograph performed 04/13/2017 FINDINGS: The lungs are well-aerated. Vascular congestion is noted. Bibasilar airspace opacities raise concern for mild interstitial edema. There is no evidence of pleural effusion or pneumothorax. The cardiomediastinal silhouette is mildly enlarged. The patient is status post median sternotomy, with evidence of prior CABG. No acute osseous abnormalities are seen. IMPRESSION: Vascular congestion and mild cardiomegaly. Bibasilar airspace opacities may reflect mild interstitial edema Electronically Signed   By: Garald Balding M.D.   On: 07/05/2017 01:36      Medications:     Current Medications: . aspirin  81 mg Oral Daily  . atorvastatin  80 mg Oral q1800  . citalopram  20 mg Oral Daily  . clopidogrel  75 mg Oral Daily  . darbepoetin (ARANESP) injection - DIALYSIS  100 mcg Intravenous Q Mon-HD  . doxercalciferol  2 mcg Intravenous Q M,W,F-HD  . ezetimibe  10 mg Oral Daily  . gabapentin  300 mg Oral QHS  . heparin  5,000 Units Subcutaneous Q8H  . hydrALAZINE  12.5 mg Oral BID  . insulin pump   Subcutaneous Q4H  .  isosorbide mononitrate  30 mg Oral Daily  . losartan  50 mg Oral Daily  . pantoprazole  40 mg Oral Daily     Infusions:     Patient Profile   Kenneth [name] is a 61 y.o. male with h/o of CKD IV requiring HD, CAD s/p CABG x 3 05/2016, Transient Post-op Afib, DM2, and HTN.  Admitted 07/05/17 with acute CP and SOB.   Assessment/Plan   1. Elevated troponin with CAD s/p CABG - Troponin elevated to 2.1 on admit burt remains flat. May be demand ischemia from HTN urgency and volume overload.  - Continue to  cycle. Unless markedly elevated or with worsening symptoms unlikely to pursue further ischemic work up at this time.   2. Acute on chronic diastolic CHF with acute hypoxic respiratory failure - NYHA II-III at baseline - Volume status remains elevated after dialysis this am. Would recommend he get repeat dialysis tomorrow if Renal agreeable.  - Continue losartan 50 mg daily - Continue toprol XL 25 mg qhs - Increase hydral to 25 mg BID. 10 mg hydral IV as needed for pressure > 140.  If consistently > 150, would re-start on low dose Nitro drip.  - Continue imdur 30 mg daily.   3. ESRD - Continue dialysis per Renal.  4. Hypertensive Urgency - Pressures improved. Meds as above.  - Low threshold to restart NTG drip if pressures do not remains stable with hydralazine  5. DM2 - Continue insulin pump  6. ID - Pt ill appearing and slightly clammy.  - Follow CBC in am. With non-specific URI symptoms and productive cough, may need ABX.  - Afebrile thus far. Repeat CXR in am.   Pt seems very tenuous on exam. We will continue to follow.   Length of Stay: 0  Annamaria Helling  07/05/2017, 3:09 PM  Advanced Heart Failure Team Pager [phone] (M-F; 7a - 4p)  Please contact [facility] for night-coverage after hours (4p -7a ) and weekends on [email or web address]  Patient seen and examined with the above-signed Advanced Practice Provider and/or Housestaff. I personally reviewed laboratory data, imaging studies and relevant notes. I independently examined the patient and formulated the important aspects of the plan. I have edited the note to reflect any of my changes or salient points. I have personally discussed the plan with the patient and/or family.  Main issue appears to be acute pulmonary edema but without clear trigger (except for possible HTN crisis). Troponin mildly elevated but flat and ECG essentially unchanged so doubt ACS. He does appear to have viral syndrome with subjective fevers  and productive cough. Respiratory status somewhat improved after HD. Still with volume overloaded.  Will focus on BP control. Increase oral hydralazine and restart NTG as needed.  Suspect he will need repeat HD tomorrow. If continue with productive cough would get sputum culture and have low threshold to start abx. Will not start heparin at this time as suspicion for ACS is low. Will repeat echo as well. Check PCT and repeat CXR in am.  Glori Bickers, MD  9:50 PM

## 2017-07-05 NOTE — Progress Notes (Signed)
BS recheck is 69

## 2017-07-05 NOTE — ED Notes (Signed)
Dianne, in dialysis accepts report. [place] 1

## 2017-07-05 NOTE — Progress Notes (Signed)
BS is 64, OJ and peanut butter crackers given

## 2017-07-05 NOTE — Progress Notes (Signed)
Removed patient from bipap and placed on 2 lpm nasal cannula while on dialysis. Pt in no distress and resting comfortably. sp02 100%, RR 16, HR 69. Will continue to monitor.

## 2017-07-06 ENCOUNTER — Inpatient Hospital Stay (HOSPITAL_COMMUNITY): Payer: Medicare Other

## 2017-07-06 ENCOUNTER — Inpatient Hospital Stay (HOSPITAL_COMMUNITY): Payer: BLUE CROSS/BLUE SHIELD

## 2017-07-06 DIAGNOSIS — I340 Nonrheumatic mitral (valve) insufficiency: Secondary | ICD-10-CM

## 2017-07-06 DIAGNOSIS — I214 Non-ST elevation (NSTEMI) myocardial infarction: Principal | ICD-10-CM

## 2017-07-06 LAB — CBC
HCT: 30 % — ABNORMAL LOW (ref 39.0–52.0)
HEMOGLOBIN: 9.8 g/dL — AB (ref 13.0–17.0)
MCH: 30 pg (ref 26.0–34.0)
MCHC: 32.7 g/dL (ref 30.0–36.0)
MCV: 91.7 fL (ref 78.0–100.0)
Platelets: 115 10*3/uL — ABNORMAL LOW (ref 150–400)
RBC: 3.27 MIL/uL — ABNORMAL LOW (ref 4.22–5.81)
RDW: 14.4 % (ref 11.5–15.5)
WBC: 5.8 10*3/uL (ref 4.0–10.5)

## 2017-07-06 LAB — ECHOCARDIOGRAM COMPLETE
AOASC: 30 cm
AVLVOTPG: 5 mmHg
CHL CUP DOP CALC LVOT VTI: 29.9 cm
CHL CUP MV DEC (S): 236
E/e' ratio: 10.84
EWDT: 236 ms
FS: 31 % (ref 28–44)
Height: 64 in
IV/PV OW: 0.98
LA ID, A-P, ES: 35 mm
LA diam index: 2.05 cm/m2
LA vol A4C: 59 ml
LAVOL: 64.5 mL
LAVOLIN: 37.9 mL/m2
LDCA: 3.8 cm2
LEFT ATRIUM END SYS DIAM: 35 mm
LV E/e'average: 10.84
LV PW d: 12 mm — AB (ref 0.6–1.1)
LVEEMED: 10.84
LVELAT: 10.7 cm/s
LVOT SV: 114 mL
LVOT peak vel: 117 cm/s
LVOTD: 22 mm
Lateral S' vel: 9.9 cm/s
MV pk E vel: 116 m/s
MVPG: 5 mmHg
MVPKAVEL: 88.8 m/s
RV sys press: 32 mmHg
Reg peak vel: 271 cm/s
TAPSE: 13.4 mm
TDI e' lateral: 10.7
TDI e' medial: 5.87
TR max vel: 271 cm/s
Weight: 2240 oz

## 2017-07-06 LAB — GLUCOSE, CAPILLARY
GLUCOSE-CAPILLARY: 66 mg/dL (ref 65–99)
Glucose-Capillary: 114 mg/dL — ABNORMAL HIGH (ref 65–99)
Glucose-Capillary: 146 mg/dL — ABNORMAL HIGH (ref 65–99)
Glucose-Capillary: 132 mg/dL — ABNORMAL HIGH (ref 65–99)
Glucose-Capillary: 77 mg/dL (ref 65–99)
Glucose-Capillary: 145 mg/dL — ABNORMAL HIGH (ref 65–99)
Glucose-Capillary: 134 mg/dL — ABNORMAL HIGH (ref 65–99)

## 2017-07-06 LAB — TROPONIN I
TROPONIN I: 1.75 ng/mL — AB (ref <0.03)
Troponin I: 1.48 ng/mL (ref <0.03)
Troponin I: 1.13 ng/mL (ref <0.03)

## 2017-07-06 LAB — POCT I-STAT 3, ART BLOOD GAS (G3+)
Acid-Base Excess: 3 mmol/L — ABNORMAL HIGH (ref 0.0–2.0)
BICARBONATE: 28 mmol/L (ref 20.0–28.0)
O2 Saturation: 97 %
PCO2 ART: 42.2 mmHg (ref 32.0–48.0)
PO2 ART: 89 mmHg (ref 83.0–108.0)
Patient temperature: 98
TCO2: 29 mmol/L (ref 22–32)
pH, Arterial: 7.429 (ref 7.350–7.450)

## 2017-07-06 LAB — BASIC METABOLIC PANEL
ANION GAP: 12 (ref 5–15)
BUN: 24 mg/dL — AB (ref 6–20)
CALCIUM: 8.3 mg/dL — AB (ref 8.9–10.3)
CO2: 28 mmol/L (ref 22–32)
Chloride: 93 mmol/L — ABNORMAL LOW (ref 101–111)
Creatinine, Ser: 4.66 mg/dL — ABNORMAL HIGH (ref 0.61–1.24)
GFR calc Af Amer: 14 mL/min — ABNORMAL LOW (ref >60)
GFR, EST NON AFRICAN AMERICAN: 12 mL/min — AB (ref >60)
GLUCOSE: 154 mg/dL — AB (ref 65–99)
Potassium: 5 mmol/L (ref 3.5–5.1)
Sodium: 133 mmol/L — ABNORMAL LOW (ref 135–145)

## 2017-07-06 LAB — PROCALCITONIN

## 2017-07-06 MED ORDER — NITROGLYCERIN 0.4 MG SL SUBL
0.4000 mg | SUBLINGUAL_TABLET | SUBLINGUAL | Status: DC | PRN
  Administered 2017-07-06: 0.4 mg via SUBLINGUAL
  Filled 2017-07-06: qty 1

## 2017-07-06 MED ORDER — METOPROLOL SUCCINATE ER 25 MG PO TB24
25.0000 mg | ORAL_TABLET | Freq: Every day | ORAL | Status: DC
  Administered 2017-07-06 – 2017-07-08 (×3): 25 mg via ORAL
  Filled 2017-07-06 (×3): qty 1

## 2017-07-06 NOTE — Progress Notes (Signed)
Pt BG 145. Pt used pump to deliver insulin to self. Pt states he delivered 7 units.   [place]  Kenneth Zamor, RN

## 2017-07-06 NOTE — Progress Notes (Signed)
  Paged for Chest pain  History difficult with [place] as primary language. Interpreter acquired via iPad.   Pt states CP is stable and relatively unchanged over past 2-3 weeks. He says it is a pressure that is slightly worse with exertion. He also reports chest congestion and cough over the same tired.   He is NOT acutely worse this morning and denies any acute symptoms this am.   Troponin drawn and sent. Echo being performed currently. CXR pending.   Pt aware that he may require repeat Heart cath if CP does not improve or troponin is elevated. He is OK with this.   All above discussed with Dr. Tamala Julian "[address] [place], [place] 07/06/2017 9:25 AM

## 2017-07-06 NOTE — Progress Notes (Addendum)
Patient ID: Kenneth [name], male   DOB: [birth year], 61 y.o.   MRN: [id number]     Advanced Heart Failure Rounding Note  Primary Cardiologist: Aundra Dubin  Subjective:    Breathing better after HD yesterday.  Lying nearly flat today.  Still reports heaviness in his chest but very mild.   Presenting symptom was dyspnea with chest heaviness, CXR with pulmonary edema and BP elevated.  SBP generally in 110s-140s overnight, improved control.    Troponin peaked at 2.6, not much trend.  PCT < 0.1.  Afebrile.    Objective:   Weight Range: 140 lb (63.5 kg) Body mass index is 24.03 kg/m.   Vital Signs:   Temp:  [98 F (36.7 C)-99.8 F (37.7 C)] 98.5 F (36.9 C) (10/23 0700) Pulse Rate:  [59-78] 70 (10/23 0700) Resp:  [9-23] 15 (10/22 2301) BP: (84-150)/(39-79) 120/50 (10/23 0700) SpO2:  [94 %-100 %] 94 % (10/22 2301)    Weight change: Filed Weights   07/05/17 0254  Weight: 140 lb (63.5 kg)    Intake/Output:   Intake/Output Summary (Last 24 hours) at 07/06/17 0806 Last data filed at 07/06/17 0434  Gross per 24 hour  Intake              360 ml  Output             2740 ml  Net            -2380 ml      Physical Exam    General:  Well appearing. No resp difficulty HEENT: Normal Neck: Supple. JVP8-9 cm. Carotids 2+ bilat; no bruits. No lymphadenopathy or thyromegaly appreciated. Cor: PMI nondisplaced. Regular rate & rhythm. No rubs, gallops.  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 Lungs: Clear Abdomen: Soft, nontender, nondistended. No hepatosplenomegaly. No bruits or masses. Good bowel sounds. Extremities: No cyanosis, clubbing, rash, edema Neuro: Alert & orientedx3, cranial nerves grossly intact. moves all 4 extremities w/o difficulty. Affect pleasant   Telemetry   NSR (personally reviewed)  Labs    CBC  Recent Labs  07/05/17 0117  07/05/17 0630 07/06/17 0250  WBC 6.6  --  6.9 5.8  NEUTROABS 4.1  --   --    --   HGB 9.5*  < > 9.4* 9.8*  HCT 29.0*  < > 27.7* 30.0*  MCV 92.7  --  92.3 91.7  PLT 140*  --  159 115*  < > = values in this interval not displayed. Basic Metabolic Panel  Recent Labs  07/05/17 0117 07/05/17 0132 07/05/17 0630 07/06/17 0250  NA 134* 137  --  133*  K 5.5* 5.5*  --  5.0  CL 100* 100*  --  93*  CO2 26  --   --  28  GLUCOSE 161* 162*  --  154*  BUN 38* 37*  --  24*  CREATININE 6.76* 7.00* 7.01* 4.66*  CALCIUM 8.4*  --   --  8.3*   Liver Function Tests  Recent Labs  07/05/17 0117  AST 61*  ALT 51  ALKPHOS 118  BILITOT 0.8  PROT 7.0  ALBUMIN 3.5   No results for input(s): LIPASE, AMYLASE in the last 72 hours. Cardiac Enzymes  Recent Labs  07/05/17 1403 07/05/17 1822 07/05/17 2008  TROPONINI 2.62* 1.93* 1.92*    BNP: BNP (last 3 results)  Recent Labs  10/30/16 2024 12/15/16 2148 04/11/17 1529  BNP 1,900.0* 881.1* 1,724.5*  ProBNP (last 3 results) No results for input(s): PROBNP in the last 8760 hours.   D-Dimer No results for input(s): DDIMER in the last 72 hours. Hemoglobin A1C No results for input(s): HGBA1C in the last 72 hours. Fasting Lipid Panel No results for input(s): CHOL, HDL, LDLCALC, TRIG, CHOLHDL, LDLDIRECT in the last 72 hours. Thyroid Function Tests No results for input(s): TSH, T4TOTAL, T3FREE, THYROIDAB in the last 72 hours.  Invalid input(s): FREET3  Other results:   Imaging     No results found.   Medications:     Scheduled Medications: . aspirin  81 mg Oral Daily  . atorvastatin  80 mg Oral q1800  . citalopram  20 mg Oral Daily  . clopidogrel  75 mg Oral Daily  . darbepoetin (ARANESP) injection - DIALYSIS  100 mcg Intravenous Q Mon-HD  . doxercalciferol  2 mcg Intravenous Q M,W,F-HD  . ezetimibe  10 mg Oral Daily  . gabapentin  300 mg Oral QHS  . heparin  5,000 Units Subcutaneous Q8H  . hydrALAZINE  25 mg Oral BID  . insulin pump   Subcutaneous Q4H  . isosorbide mononitrate  30 mg Oral  Daily  . losartan  50 mg Oral Daily  . metoprolol succinate  25 mg Oral Daily  . pantoprazole  40 mg Oral Daily     Infusions:   PRN Medications:  acetaminophen **OR** acetaminophen, hydrALAZINE, ondansetron **OR** ondansetron (ZOFRAN) IV    Patient Profile   JAVANNI MARING is a 61 y.o. male with h/o of CKD IV requiring HD, CAD s/p CABG x 3 05/2016, Transient Post-op Afib, DM2, and HTN.  Admitted 07/05/17 with acute CP and SOB, elevated troponin.   Assessment/Plan   1. Acute pulmonary edema: Last echo in 4/18 with EF 55-60%, EF recovered after CABG. ?Trigger => hypertensive emergency perhaps versus ischemic event. Still some volume overload on exam but feels much better after HD yesterday.  - Would repeat HD today.  2. CAD: s/p CABG in 9/17 with LIMA-LAD, SVG-OM, SVG-RCA.  Has been taking meds.  Troponin elevated but fairly flat trend and ECG not significantly changed => true NSTEMI with plaque rupture versus demand ischemia from hypertensive emergency/pulmonary edema in setting of ESRD. Symptomatically much improved today.  He was not started on heparin gtt at admission.  - Continue ASA 81, Plavix 75, statin, Toprol XL.   - He will have echo today to assess for new regional wall motion abnormalities.  - Given difficulty explaining trigger for pulmonary edema, think it would be reasonable to reassess his coronaries/grafts, especially if echo has changed.  He will likely have HD again today and I will tentatively schedule a cath for tomorrow. Risks/benefits discussed with patient and he agrees to proceed.  3. Diabetes: Has insulin pump.  4. ESRD: Per renal.  Suspect he would benefit from HD again today with residual volume overload.  5. ID: No definite signs of infection, afebrile + normal WBCs + PCT not elevated.  6. HTN: BP controlled this morning on current regimen, continue.   Length of Stay: 1  Loralie Champagne, MD  07/06/2017, 8:06 AM  Advanced Heart Failure Team Pager  [phone] (M-F; 7a - 4p)  Please contact [facility] for night-coverage after hours (4p -7a ) and weekends on [email or web address]

## 2017-07-06 NOTE — Progress Notes (Signed)
  Echocardiogram 2D Echocardiogram has been performed.  Johny Chess 07/06/2017, 9:51 AM

## 2017-07-06 NOTE — Progress Notes (Signed)
PROGRESS NOTE    Kenneth [name]  OZH:[id number] DOB: 22-Dec-1955 DOA: 07/05/2017 PCP: [name], MD     Brief Narrative:  61 y/o man admitted from home on 10/22 due to SOB and CP. He has ESRD. Was found to have pulmonary edema and a NSTEMI and admission requested. Had hypertensive urgency on admission as well and was placed on a nitro drip which has since been discontinued.   Assessment & Plan:   Principal Problem:   Acute pulmonary edema (HCC) Active Problems:   S/P CABG x 3   ESRD (end stage renal disease) (HCC)   CAD (coronary artery disease)   Anemia, chronic renal failure   Hypertensive emergency   Non-ST elevated myocardial infarction ([place])   Acute Pulmonary Edema -In patient with ESRD. Has not had any missed HD sessions. -Improved with HD. For another HD session today.  NSTEMI -Had some CP today. -H/o CAD with CABG x3. -Has been seen by cardiology who is planning on a cardiac cath in am. -Continue ASA, plavix, lipitor.  DM 1 -Patient has been managing his own insulin pump.   DVT prophylaxis: SQ heparin Code Status: Full Code Family Communication: patient only Disposition Plan: to be determined  Consultants:   Cardiology  Renal  Procedures:   HD  Cath scheduled for 10/24  Antimicrobials:  Anti-infectives    None       Subjective: Some CP early this am with ambulation, mild DOE.  Objective: Vitals:   07/05/17 2301 07/06/17 0700 07/06/17 1053 07/06/17 1111  BP: (!) 106/42 (!) 120/50 (!) 121/48 139/62  Pulse: 71 70 77 75  Resp: 15  12   Temp: 99.8 F (37.7 C) 98.5 F (36.9 C) 98.2 F (36.8 C) 98.4 F (36.9 C)  TempSrc: Oral Oral Oral Oral  SpO2: 94%     Weight:      Height:        Intake/Output Summary (Last 24 hours) at 07/06/17 1811 Last data filed at 07/06/17 1400  Gross per 24 hour  Intake              960 ml  Output                0 ml  Net              960 ml   Filed Weights   07/05/17 0254  Weight: 63.5 kg (140  lb)    Examination:  General exam: Alert, awake, oriented x 3 Respiratory system: Left basilar crackles, mild Cardiovascular system:RRR. No murmurs, rubs, gallops. Gastrointestinal system: Abdomen is nondistended, soft and nontender. No organomegaly or masses felt. Normal bowel sounds heard. Central nervous system: Alert and oriented. No focal neurological deficits. Extremities: trace pedal edema Skin: No rashes, lesions or ulcers Psychiatry: Judgement and insight appear normal. Mood & affect appropriate.     Data Reviewed: I have personally reviewed following labs and imaging studies  CBC:  Recent Labs Lab 07/05/17 0117 07/05/17 0132 07/05/17 0630 07/06/17 0250  WBC 6.6  --  6.9 5.8  NEUTROABS 4.1  --   --   --   HGB 9.5* 9.9* 9.4* 9.8*  HCT 29.0* 29.0* 27.7* 30.0*  MCV 92.7  --  92.3 91.7  PLT 140*  --  159 629*   Basic Metabolic Panel:  Recent Labs Lab 07/05/17 0117 07/05/17 0132 07/05/17 0630 07/06/17 0250  NA 134* 137  --  133*  K 5.5* 5.5*  --  5.0  CL 100* 100*  --  93*  CO2 26  --   --  28  GLUCOSE 161* 162*  --  154*  BUN 38* 37*  --  24*  CREATININE 6.76* 7.00* 7.01* 4.66*  CALCIUM 8.4*  --   --  8.3*   GFR: Estimated Creatinine Clearance: 14.1 mL/min (A) (by C-G formula based on SCr of 4.66 mg/dL (H)). Liver Function Tests:  Recent Labs Lab 07/05/17 0117  AST 61*  ALT 51  ALKPHOS 118  BILITOT 0.8  PROT 7.0  ALBUMIN 3.5   No results for input(s): LIPASE, AMYLASE in the last 168 hours. No results for input(s): AMMONIA in the last 168 hours. Coagulation Profile: No results for input(s): INR, PROTIME in the last 168 hours. Cardiac Enzymes:  Recent Labs Lab 07/05/17 1403 07/05/17 1822 07/05/17 2008 07/06/17 0906 07/06/17 1419  TROPONINI 2.62* 1.93* 1.92* 1.75* 1.48*   BNP (last 3 results) No results for input(s): PROBNP in the last 8760 hours. HbA1C: No results for input(s): HGBA1C in the last 72 hours. CBG:  Recent Labs Lab  07/06/17 0043 07/06/17 0111 07/06/17 0339 07/06/17 0756 07/06/17 1235  GLUCAP 66 114* 146* 132* 77   Lipid Profile: No results for input(s): CHOL, HDL, LDLCALC, TRIG, CHOLHDL, LDLDIRECT in the last 72 hours. Thyroid Function Tests: No results for input(s): TSH, T4TOTAL, FREET4, T3FREE, THYROIDAB in the last 72 hours. Anemia Panel: No results for input(s): VITAMINB12, FOLATE, FERRITIN, TIBC, IRON, RETICCTPCT in the last 72 hours. Urine analysis:    Component Value Date/Time   COLORURINE YELLOW 10/30/2016 [place] 10/30/2016 2339   LABSPEC 1.010 10/30/2016 2339   PHURINE 5.0 10/30/2016 2339   GLUCOSEU NEGATIVE 10/30/2016 2339   HGBUR NEGATIVE 10/30/2016 2339   BILIRUBINUR NEGATIVE 10/30/2016 2339   [place] 10/30/2016 2339   PROTEINUR 100 (A) 10/30/2016 2339   NITRITE NEGATIVE 10/30/2016 2339   LEUKOCYTESUR NEGATIVE 10/30/2016 2339   Sepsis Labs: @LABRCNTIP (procalcitonin:4,lacticidven:4)  ) Recent Results (from the past 240 hour(s))  Blood culture (routine x 2)     Status: None (Preliminary result)   Collection Time: 07/05/17  1:18 AM  Result Value Ref Range Status   Specimen Description BLOOD RIGHT ANTECUBITAL  Final   Special Requests   Final    BOTTLES DRAWN AEROBIC AND ANAEROBIC Blood Culture adequate volume   Culture NO GROWTH 1 DAY  Final   Report Status PENDING  Incomplete  Blood culture (routine x 2)     Status: None (Preliminary result)   Collection Time: 07/05/17  2:16 AM  Result Value Ref Range Status   Specimen Description BLOOD RIGHT ARM  Final   Special Requests IN PEDIATRIC BOTTLE Blood Culture adequate volume  Final   Culture NO GROWTH 1 DAY  Final   Report Status PENDING  Incomplete  MRSA PCR Screening     Status: None   Collection Time: 07/05/17  3:09 PM  Result Value Ref Range Status   MRSA by PCR NEGATIVE NEGATIVE Final    Comment:        The GeneXpert MRSA Assay (FDA approved for NASAL specimens only), is one  component of a comprehensive MRSA colonization surveillance program. It is not intended to diagnose MRSA infection nor to guide or monitor treatment for MRSA infections.          Radiology Studies: Ct Head Wo Contrast  Result Date: 07/05/2017 CLINICAL DATA:  Acute onset of altered mental status. Shortness of breath. Initial encounter. EXAM: CT  HEAD WITHOUT CONTRAST TECHNIQUE: Contiguous axial images were obtained from the base of the skull through the vertex without intravenous contrast. COMPARISON:  CT of the head performed 04/13/2017 FINDINGS: Brain: No evidence of acute infarction, hemorrhage, hydrocephalus, extra-axial collection or mass lesion/mass effect. The posterior fossa, including the cerebellum, brainstem and fourth ventricle, is within normal limits. The third and lateral ventricles, and basal ganglia are unremarkable in appearance. The cerebral hemispheres are symmetric in appearance, with normal gray-white differentiation. No mass effect or midline shift is seen. Vascular: No hyperdense vessel or unexpected calcification. Skull: There is no evidence of fracture; visualized osseous structures are unremarkable in appearance. Sinuses/Orbits: The visualized portions of the orbits are within normal limits. The paranasal sinuses and mastoid air cells are well-aerated. Other: No significant soft tissue abnormalities are seen. IMPRESSION: Unremarkable noncontrast CT of the head. Electronically Signed   By: Garald Balding M.D.   On: 07/05/2017 06:37   Dg Chest Portable 1 View  Result Date: 07/05/2017 CLINICAL DATA:  Acute onset of generalized chest pain and shortness of breath. Initial encounter. EXAM: PORTABLE CHEST 1 VIEW COMPARISON:  Chest radiograph performed 04/13/2017 FINDINGS: The lungs are well-aerated. Vascular congestion is noted. Bibasilar airspace opacities raise concern for mild interstitial edema. There is no evidence of pleural effusion or pneumothorax. The  cardiomediastinal silhouette is mildly enlarged. The patient is status post median sternotomy, with evidence of prior CABG. No acute osseous abnormalities are seen. IMPRESSION: Vascular congestion and mild cardiomegaly. Bibasilar airspace opacities may reflect mild interstitial edema Electronically Signed   By: Garald Balding M.D.   On: 07/05/2017 01:36        Scheduled Meds: . aspirin  81 mg Oral Daily  . atorvastatin  80 mg Oral q1800  . citalopram  20 mg Oral Daily  . clopidogrel  75 mg Oral Daily  . darbepoetin (ARANESP) injection - DIALYSIS  100 mcg Intravenous Q Mon-HD  . doxercalciferol  2 mcg Intravenous Q M,W,F-HD  . ezetimibe  10 mg Oral Daily  . gabapentin  300 mg Oral QHS  . heparin  5,000 Units Subcutaneous Q8H  . hydrALAZINE  25 mg Oral BID  . insulin pump   Subcutaneous Q4H  . isosorbide mononitrate  30 mg Oral Daily  . losartan  50 mg Oral Daily  . metoprolol succinate  25 mg Oral Daily  . pantoprazole  40 mg Oral Daily   Continuous Infusions:   LOS: 1 day    Time spent: 35 minutes. Greater than 50% of this time was spent in direct contact with the patient coordinating care.     Lelon Frohlich, MD [facility] Pager [phone]  If 7PM-7AM, please contact night-coverage [email or web address] Password [email or web address] 07/06/2017, 6:11 PM

## 2017-07-06 NOTE — Procedures (Signed)
I have personally attended this patient's dialysis session.   Extra tmt today for volume removal (in anticipation of cath tomorrow) UF goal 2 liters Pre weight 67.5 standing EDW 64 BP has dropped. Likely will not get to EDW and may not get the 2 liter goal  Jamal Maes, MD [place] Pager 07/06/2017, 4:20 PM

## 2017-07-06 NOTE — Progress Notes (Addendum)
[facility] Rounding Note  Subjective/Interval History:  Feeling better Off BIPAP, IV nitro Still some chest pressure/mild heaviness  Objective Vital signs in last 24 hours: Vitals:   07/05/17 1800 07/05/17 1921 07/05/17 2301 07/06/17 0700  BP: 121/62 (!) 103/49 (!) 106/42 (!) 120/50  Pulse: 71 70 71 70  Resp: 18 18 15    Temp:  98.4 F (36.9 C) 99.8 F (37.7 C) 98.5 F (36.9 C)  TempSrc:  Oral Oral Oral  SpO2: 98% 98% 94%   Weight:      Height:       Weight change:   Intake/Output Summary (Last 24 hours) at 07/06/17 0941 Last data filed at 07/06/17 0434  Gross per 24 hour  Intake              360 ml  Output             2740 ml  Net            -2380 ml   Physical Exam:  Blood pressure (!) 120/50, pulse 70, temperature 98.5 F (36.9 C), temperature source Oral, resp. rate 15, height 5\' 4"  (1.626 m), weight 63.5 kg (140 lb), SpO2 94 %.  Pt up out of bed Still mild DOE he says + JVD Few L base crackles persist S1S2 No S3 Abd soft and NT Trace if any pretibial edema   Recent Labs Lab 07/05/17 0117 07/05/17 0132 07/05/17 0630 07/06/17 0250  NA 134* 137  --  133*  K 5.5* 5.5*  --  5.0  CL 100* 100*  --  93*  CO2 26  --   --  28  GLUCOSE 161* 162*  --  154*  BUN 38* 37*  --  24*  CREATININE 6.76* 7.00* 7.01* 4.66*  CALCIUM 8.4*  --   --  8.3*    Recent Labs Lab 07/05/17 0117  AST 61*  ALT 51  ALKPHOS 118  BILITOT 0.8  PROT 7.0  ALBUMIN 3.5    Recent Labs Lab 07/05/17 0117 07/05/17 0132 07/05/17 0630 07/06/17 0250  WBC 6.6  --  6.9 5.8  NEUTROABS 4.1  --   --   --   HGB 9.5* 9.9* 9.4* 9.8*  HCT 29.0* 29.0* 27.7* 30.0*  MCV 92.7  --  92.3 91.7  PLT 140*  --  159 115*    Recent Labs Lab 07/05/17 0117 07/05/17 0630 07/05/17 1403 07/05/17 1822 07/05/17 2008  TROPONINI 1.56* 2.16* 2.62* 1.93* 1.92*     Recent Labs Lab 07/05/17 2139 07/06/17 0043 07/06/17 0111 07/06/17 0339 07/06/17 0756  GLUCAP 74 66 114* 146* 132*    Studies/Results: Ct Head Wo Contrast  Result Date: 07/05/2017 CLINICAL DATA:  Acute onset of altered mental status. Shortness of breath. Initial encounter. EXAM: CT HEAD WITHOUT CONTRAST TECHNIQUE: Contiguous axial images were obtained from the base of the skull through the vertex without intravenous contrast. COMPARISON:  CT of the head performed 04/13/2017 FINDINGS: Brain: No evidence of acute infarction, hemorrhage, hydrocephalus, extra-axial collection or mass lesion/mass effect. The posterior fossa, including the cerebellum, brainstem and fourth ventricle, is within normal limits. The third and lateral ventricles, and basal ganglia are unremarkable in appearance. The cerebral hemispheres are symmetric in appearance, with normal gray-white differentiation. No mass effect or midline shift is seen. Vascular: No hyperdense vessel or unexpected calcification. Skull: There is no evidence of fracture; visualized osseous structures are unremarkable in appearance. Sinuses/Orbits: The visualized portions of the orbits are within normal limits. The paranasal  sinuses and mastoid air cells are well-aerated. Other: No significant soft tissue abnormalities are seen. IMPRESSION: Unremarkable noncontrast CT of the head. Electronically Signed   By: Garald Balding M.D.   On: 07/05/2017 06:37   Dg Chest Portable 1 View  Result Date: 07/05/2017 CLINICAL DATA:  Acute onset of generalized chest pain and shortness of breath. Initial encounter. EXAM: PORTABLE CHEST 1 VIEW COMPARISON:  Chest radiograph performed 04/13/2017 FINDINGS: The lungs are well-aerated. Vascular congestion is noted. Bibasilar airspace opacities raise concern for mild interstitial edema. There is no evidence of pleural effusion or pneumothorax. The cardiomediastinal silhouette is mildly enlarged. The patient is status post median sternotomy, with evidence of prior CABG. No acute osseous abnormalities are seen. IMPRESSION: Vascular congestion and mild  cardiomegaly. Bibasilar airspace opacities may reflect mild interstitial edema Electronically Signed   By: Garald Balding M.D.   On: 07/05/2017 01:36   Medications:  . aspirin  81 mg Oral Daily  . atorvastatin  80 mg Oral q1800  . citalopram  20 mg Oral Daily  . clopidogrel  75 mg Oral Daily  . darbepoetin (ARANESP) injection - DIALYSIS  100 mcg Intravenous Q Mon-HD  . doxercalciferol  2 mcg Intravenous Q M,W,F-HD  . ezetimibe  10 mg Oral Daily  . gabapentin  300 mg Oral QHS  . heparin  5,000 Units Subcutaneous Q8H  . hydrALAZINE  25 mg Oral BID  . insulin pump   Subcutaneous Q4H  . isosorbide mononitrate  30 mg Oral Daily  . losartan  50 mg Oral Daily  . metoprolol succinate  25 mg Oral Daily  . pantoprazole  40 mg Oral Daily     Background: 61 y.o. year-old Asian man with ESRD (MWF HD [place]), CAD, prior CABG, DM (insulin pump). Presented with SOB/CP. + trops/evidence NSTEMI. Pulmonary edema requiring BIPAP. We were asked to see to provide acute HD.  Pt had not missed any HD sessions. Had CP on and off with both rest and exertion, along with SOB.  Assessment/Recommendations  1. ESRD - usual MWF HD. Urgent HD 10/22 for pulmonary edema. No missed HD sessions. Weight not done pre or post HD d/t BIPAP. Net UF recorded as 2.7 liters. Cardiology would like additional volume removal today. With BP as low as 90's not sure we can accomplish desired fluid removal. Will try HD today off schedule for additional volume removal (then may forego HD tomorrow so as not to interfere with cath - will make that decision tomorrow  2. NSTEMI - CABG 05/2016. Getting ECHO now. Tentatively for cath tomorrow.   3. Pulmonary edema - off BIPAP. Nearly lying flat. No f/u CXR. 2.7 liters off yesterday, cards would like another HD today. Trigger for pulm edema unclear - ischemic vs HT urg. For cath tomorrow to sort out. 4. HT urgency - Required IV nitro. Off now. BP now low side 5. Anemia 2/2 ESRD - Dosed with  Aranesp 100 at HD 10/22.  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 Check phos. Not done as ordered. Confirm binder doses. 7. IDDM - insulin pump. Primary managing DM  Jamal Maes, MD [facility] [phone] pager 07/06/2017, 9:41 AM

## 2017-07-06 NOTE — Plan of Care (Signed)
Problem: Pain Managment: Goal: General experience of comfort will improve Outcome: Progressing Chest pain dec after nitro; pt found ambulating in hallway this morning

## 2017-07-07 ENCOUNTER — Encounter (HOSPITAL_COMMUNITY)
Admission: EM | Disposition: A | Discharge status: Home / Self Care | Payer: Self-pay | Source: Home / Self Care | Attending: Internal Medicine

## 2017-07-07 DIAGNOSIS — N186 End stage renal disease: Secondary | ICD-10-CM

## 2017-07-07 DIAGNOSIS — I25810 Atherosclerosis of coronary artery bypass graft(s) without angina pectoris: Secondary | ICD-10-CM

## 2017-07-07 DIAGNOSIS — D631 Anemia in chronic kidney disease: Secondary | ICD-10-CM

## 2017-07-07 DIAGNOSIS — N185 Chronic kidney disease, stage 5: Secondary | ICD-10-CM

## 2017-07-07 DIAGNOSIS — I214 Non-ST elevation (NSTEMI) myocardial infarction: Secondary | ICD-10-CM

## 2017-07-07 HISTORY — PX: LEFT HEART CATH AND CORS/GRAFTS ANGIOGRAPHY: CATH118250

## 2017-07-07 HISTORY — PX: CORONARY STENT INTERVENTION: CATH118234

## 2017-07-07 LAB — RENAL FUNCTION PANEL
ANION GAP: 10 (ref 5–15)
Albumin: 3.4 g/dL — ABNORMAL LOW (ref 3.5–5.0)
BUN: 19 mg/dL (ref 6–20)
CHLORIDE: 96 mmol/L — AB (ref 101–111)
CO2: 29 mmol/L (ref 22–32)
CREATININE: 4.08 mg/dL — AB (ref 0.61–1.24)
Calcium: 8.7 mg/dL — ABNORMAL LOW (ref 8.9–10.3)
GFR, EST AFRICAN AMERICAN: 17 mL/min — AB (ref >60)
GFR, EST NON AFRICAN AMERICAN: 15 mL/min — AB (ref >60)
Glucose, Bld: 56 mg/dL — ABNORMAL LOW (ref 65–99)
POTASSIUM: 4.8 mmol/L (ref 3.5–5.1)
Phosphorus: 3.9 mg/dL (ref 2.5–4.6)
Sodium: 135 mmol/L (ref 135–145)

## 2017-07-07 LAB — PROTIME-INR
INR: 0.95
Prothrombin Time: 12.6 seconds (ref 11.4–15.2)

## 2017-07-07 LAB — POCT ACTIVATED CLOTTING TIME: ACTIVATED CLOTTING TIME: 318 s

## 2017-07-07 LAB — GLUCOSE, CAPILLARY
GLUCOSE-CAPILLARY: 134 mg/dL — AB (ref 65–99)
GLUCOSE-CAPILLARY: 61 mg/dL — AB (ref 65–99)
GLUCOSE-CAPILLARY: 65 mg/dL (ref 65–99)
Glucose-Capillary: 84 mg/dL (ref 65–99)
Glucose-Capillary: 82 mg/dL (ref 65–99)
Glucose-Capillary: 90 mg/dL (ref 65–99)
Glucose-Capillary: 82 mg/dL (ref 65–99)
Glucose-Capillary: 221 mg/dL — ABNORMAL HIGH (ref 65–99)

## 2017-07-07 SURGERY — LEFT HEART CATH AND CORS/GRAFTS ANGIOGRAPHY
Anesthesia: LOCAL

## 2017-07-07 MED ORDER — HEPARIN (PORCINE) IN NACL 2-0.9 UNIT/ML-% IJ SOLN
INTRAMUSCULAR | Status: DC | PRN
  Administered 2017-07-07: 1500 mL

## 2017-07-07 MED ORDER — SODIUM CHLORIDE 0.9% FLUSH
3.0000 mL | Freq: Two times a day (BID) | INTRAVENOUS | Status: DC
  Administered 2017-07-07: 22:00:00 3 mL via INTRAVENOUS

## 2017-07-07 MED ORDER — SODIUM CHLORIDE 0.9% FLUSH: 3.0000 mL | INTRAVENOUS | Status: DC | PRN

## 2017-07-07 MED ORDER — MIDAZOLAM HCL 2 MG/2ML IJ SOLN
INTRAMUSCULAR | Status: DC | PRN
  Administered 2017-07-07 (×2): 1 mg via INTRAVENOUS

## 2017-07-07 MED ORDER — NITROGLYCERIN 1 MG/10 ML FOR IR/CATH LAB
INTRA_ARTERIAL | Status: AC
  Filled 2017-07-07: qty 10

## 2017-07-07 MED ORDER — DEXTROSE 5 % IV SOLN: INTRAVENOUS | Status: DC

## 2017-07-07 MED ORDER — ANGIOPLASTY BOOK
Freq: Once | Status: AC
  Administered 2017-07-08: 1
  Filled 2017-07-07: qty 1

## 2017-07-07 MED ORDER — FENTANYL CITRATE (PF) 100 MCG/2ML IJ SOLN
INTRAMUSCULAR | Status: DC | PRN
  Administered 2017-07-07 (×2): 25 ug via INTRAVENOUS

## 2017-07-07 MED ORDER — LIDOCAINE HCL 2 % IJ SOLN
INTRAMUSCULAR | Status: AC
  Filled 2017-07-07: qty 20

## 2017-07-07 MED ORDER — MIDAZOLAM HCL 2 MG/2ML IJ SOLN
INTRAMUSCULAR | Status: AC
  Filled 2017-07-07: qty 2

## 2017-07-07 MED ORDER — FENTANYL CITRATE (PF) 100 MCG/2ML IJ SOLN
INTRAMUSCULAR | Status: DC | PRN
  Administered 2017-07-07: 25 ug via INTRAVENOUS

## 2017-07-07 MED ORDER — HEPARIN (PORCINE) IN NACL 2-0.9 UNIT/ML-% IJ SOLN
INTRAMUSCULAR | Status: AC
  Filled 2017-07-07: qty 1000

## 2017-07-07 MED ORDER — BIVALIRUDIN BOLUS VIA INFUSION - CUPID
INTRAVENOUS | Status: DC | PRN
  Administered 2017-07-07: 49.5 mg via INTRAVENOUS

## 2017-07-07 MED ORDER — CLOPIDOGREL BISULFATE 300 MG PO TABS
ORAL_TABLET | ORAL | Status: AC
  Filled 2017-07-07: qty 1

## 2017-07-07 MED ORDER — SODIUM CHLORIDE 0.9% FLUSH
3.0000 mL | Freq: Two times a day (BID) | INTRAVENOUS | Status: DC
  Administered 2017-07-07: 3 mL via INTRAVENOUS

## 2017-07-07 MED ORDER — SODIUM CHLORIDE 0.9 % IV SOLN: INTRAVENOUS | Status: DC

## 2017-07-07 MED ORDER — ASPIRIN 81 MG PO CHEW: 81.0000 mg | CHEWABLE_TABLET | ORAL | Status: DC

## 2017-07-07 MED ORDER — BIVALIRUDIN TRIFLUOROACETATE 250 MG IV SOLR
INTRAVENOUS | Status: AC
  Filled 2017-07-07: qty 250

## 2017-07-07 MED ORDER — SODIUM CHLORIDE 0.9 % IV SOLN
INTRAVENOUS | Status: DC | PRN
  Administered 2017-07-07: 1.75 mg/kg/h via INTRAVENOUS

## 2017-07-07 MED ORDER — IOPAMIDOL (ISOVUE-370) INJECTION 76%
INTRAVENOUS | Status: AC
  Filled 2017-07-07: qty 100

## 2017-07-07 MED ORDER — IOPAMIDOL (ISOVUE-370) INJECTION 76%
INTRAVENOUS | Status: DC | PRN
  Administered 2017-07-07: 200 mL via INTRA_ARTERIAL

## 2017-07-07 MED ORDER — FENTANYL CITRATE (PF) 100 MCG/2ML IJ SOLN
INTRAMUSCULAR | Status: AC
  Filled 2017-07-07: qty 2

## 2017-07-07 MED ORDER — LIDOCAINE HCL 2 % IJ SOLN
INTRAMUSCULAR | Status: DC | PRN
  Administered 2017-07-07: 20 mL

## 2017-07-07 MED ORDER — NITROGLYCERIN 1 MG/10 ML FOR IR/CATH LAB
INTRA_ARTERIAL | Status: DC | PRN
  Administered 2017-07-07: 200 ug via INTRACORONARY

## 2017-07-07 MED ORDER — DEXTROSE 50 % IV SOLN
INTRAVENOUS | Status: AC
  Administered 2017-07-07: 25 mL
  Filled 2017-07-07: qty 50

## 2017-07-07 MED ORDER — IOPAMIDOL (ISOVUE-370) INJECTION 76%
INTRAVENOUS | Status: AC
  Filled 2017-07-07: qty 125

## 2017-07-07 MED ORDER — CLOPIDOGREL BISULFATE 300 MG PO TABS
ORAL_TABLET | ORAL | Status: DC | PRN
  Administered 2017-07-07: 300 mg via ORAL

## 2017-07-07 MED ORDER — ASPIRIN 81 MG PO CHEW
81.0000 mg | CHEWABLE_TABLET | ORAL | Status: AC
  Administered 2017-07-07: 81 mg via ORAL
  Filled 2017-07-07: qty 1

## 2017-07-07 MED ORDER — SODIUM CHLORIDE 0.9 % IV SOLN: 250.0000 mL | INTRAVENOUS | Status: DC | PRN

## 2017-07-07 MED ORDER — LABETALOL HCL 5 MG/ML IV SOLN: 10.0000 mg | INTRAVENOUS | Status: AC | PRN

## 2017-07-07 SURGICAL SUPPLY — 19 items
BALLN SAPPHIRE 2.5X15 (BALLOONS) ×2
BALLN SAPPHIRE ~~LOC~~ 3.25X12 (BALLOONS) ×2 IMPLANT
BALLOON SAPPHIRE 2.5X15 (BALLOONS) ×1 IMPLANT
CATH INFINITI 5 FR IM (CATHETERS) ×2 IMPLANT
CATH INFINITI 5 FR MPA2 (CATHETERS) ×2 IMPLANT
CATH INFINITI 5 FR RCB (CATHETERS) ×2 IMPLANT
CATH INFINITI 5FR AL1 (CATHETERS) ×2 IMPLANT
CATH INFINITI 5FR MULTPACK ANG (CATHETERS) ×2 IMPLANT
CATH VISTA GUIDE 6FR LCB (CATHETERS) ×2 IMPLANT
KIT ENCORE 26 ADVANTAGE (KITS) ×2 IMPLANT
KIT HEART LEFT (KITS) ×2 IMPLANT
PACK CARDIAC CATHETERIZATION (CUSTOM PROCEDURE TRAY) ×2 IMPLANT
SHEATH PINNACLE 5F 10CM (SHEATH) ×2 IMPLANT
SHEATH PINNACLE 6F 10CM (SHEATH) ×2 IMPLANT
STENT RESOLUTE ONYX 3.0X18 (Permanent Stent) ×2 IMPLANT
TRANSDUCER W/STOPCOCK (MISCELLANEOUS) ×2 IMPLANT
TUBING CIL FLEX 10 FLL-RA (TUBING) ×2 IMPLANT
WIRE EMERALD 3MM-J .035X150CM (WIRE) ×2 IMPLANT
WIRE RUNTHROUGH .014X180CM (WIRE) ×2 IMPLANT

## 2017-07-07 NOTE — Progress Notes (Signed)
Site area: right groin  Site Prior to Removal:  Level 0  Pressure Applied For 20 MINUTES    Minutes Beginning at 1800  Manual:   Yes.    Patient Status During Pull:  stable  Post Pull Groin Site:  Level 0  Post Pull Instructions Given:  Yes.    Post Pull Pulses Present:  Yes.    Dressing Applied:  Yes.    Comments:  Patient reapplied insulin pump at 1830 and ate dinner. Patient NPO post PCI until 1820 and insulin pump held til able to eat per Dr. Sabino Niemann instructions.

## 2017-07-07 NOTE — Progress Notes (Signed)
PROGRESS NOTE    Kenneth [name]  AOZ:[id number] DOB: Feb 03, 1956 DOA: 07/05/2017 PCP: [name], MD     Brief Narrative:  61 y/o man admitted from home on 10/22 due to SOB and CP. He has ESRD. Was found to have pulmonary edema and a NSTEMI and admission requested. Had hypertensive urgency on admission as well and was placed on a nitro drip which has since been discontinued.   Assessment & Plan:   Principal Problem:   Acute pulmonary edema (HCC) Active Problems:   S/P CABG x 3   ESRD (end stage renal disease) (HCC)   CAD (coronary artery disease)   Anemia, chronic renal failure   Hypertensive emergency   Non-ST elevated myocardial infarction ([place])   Acute Pulmonary Edema Resolved with HD   NSTEMI and CAD secondary prevention Initially it was suspected this was a demand type 2 (pain is chronic, troponin flat, ESRD, etc).  Patient describes that after his CABG, he was pretty active for a few months, but starting around Nov or Dec, really couldn't do anything anymore, all exertion was limited by fatigue.  He can't correlate this with chest pain or with restarting HD, and it is hard to follow him a little through the interpreter.  Furthermore, HF clinic notes indicate as recently as April that he had no chest pain or dyspnea on exertion.  However, there are notes from the ED at [facility] in June and then here twice in July where he was complaining of dyspnea, chest pain, and it appears that these were all attributed to fluid overload in ESRD.  Today, cath showed 95% SVG to OM1, stented. -Consult to Cardiology, appreciate cares -Continue aspirin, statin, Plavix, ARB, BB -Continue Zetia -Continue nitrate  Diabetes -Resume patient's insulin pump per protocol when able to take PO after procedure  Chronic systolic CHF Previous EF 96%, most recent 55%.  Euvolemic now, after HD. -Per Nephrology  ESRD on HD MWF -Consult to Neph, appreciate cares -Hectorol and Aranesp per Neph -HD  tomorrow  Hypertension Stable -Continue hydralazine, IMdur, ARB, BB  Other medications -Continue SSRI -Continue gabapentin -Continue PPI     DVT prophylaxis: Heparin Code Status: Full Code Family Communication: Wife at bedside Disposition Plan: to be determined  Consultants:   Cardiology  Renal  Procedures:   HD  Cath scheduled for 10/24  Antimicrobials:  Anti-infectives    None       Subjective: Chest pain better.  Breathing fine.  Some mild new left shoulder pain.  Otherwise no new complaints.  12 systems reviewed, negative.  In person interpreter present.  Objective: Vitals:   07/06/17 2329 07/07/17 0300 07/07/17 0431 07/07/17 0755  BP: 126/90  (!) 144/62 (!) 122/56  Pulse: 71 61 67 63  Resp: 18 (!) 5 (!) 9 20  Temp: 98.1 F (36.7 C)  (!) 97.5 F (36.4 C) 98 F (36.7 C)  TempSrc: Oral  Axillary Oral  SpO2: 98% 100% 97% 99%  Weight:   66 kg (145 lb 9.6 oz)   Height:        Intake/Output Summary (Last 24 hours) at 07/07/17 0854 Last data filed at 07/06/17 2300  Gross per 24 hour  Intake              822 ml  Output             1535 ml  Net             -713 ml  Filed Weights   07/06/17 1420 07/06/17 1740 07/07/17 0431  Weight: 67.7 kg (149 lb 4 oz) 65.8 kg (145 lb 1 oz) 66 kg (145 lb 9.6 oz)    Examination:  General exam: Alert, awake, oriented x 3 Respiratory system: CTAB, no rales. Cardiovascular system:RRR. No murmurs, rubs, gallops. Gastrointestinal system: Abdomen is nondistended, soft and nontender. No organomegaly or masses felt. Normal bowel sounds heard. Central nervous system: Alert and oriented. No focal neurological deficits. Extremities: trace pedal edema Skin: No rashes, lesions or ulcers Psychiatry: Judgement and insight appear normal. Mood & affect appropriate.     Data Reviewed: I have personally reviewed following labs and imaging studies  CBC:  Recent Labs Lab 07/05/17 0117 07/05/17 0132 07/05/17 0630  07/06/17 0250  WBC 6.6  --  6.9 5.8  NEUTROABS 4.1  --   --   --   HGB 9.5* 9.9* 9.4* 9.8*  HCT 29.0* 29.0* 27.7* 30.0*  MCV 92.7  --  92.3 91.7  PLT 140*  --  159 202*   Basic Metabolic Panel:  Recent Labs Lab 07/05/17 0117 07/05/17 0132 07/05/17 0630 07/06/17 0250 07/07/17 0350  NA 134* 137  --  133* 135  K 5.5* 5.5*  --  5.0 4.8  CL 100* 100*  --  93* 96*  CO2 26  --   --  28 29  GLUCOSE 161* 162*  --  154* 56*  BUN 38* 37*  --  24* 19  CREATININE 6.76* 7.00* 7.01* 4.66* 4.08*  CALCIUM 8.4*  --   --  8.3* 8.7*  PHOS  --   --   --   --  3.9   GFR: Estimated Creatinine Clearance: 16.1 mL/min (A) (by C-G formula based on SCr of 4.08 mg/dL (H)). Liver Function Tests:  Recent Labs Lab 07/05/17 0117 07/07/17 0350  AST 61*  --   ALT 51  --   ALKPHOS 118  --   BILITOT 0.8  --   PROT 7.0  --   ALBUMIN 3.5 3.4*   No results for input(s): LIPASE, AMYLASE in the last 168 hours. No results for input(s): AMMONIA in the last 168 hours. Coagulation Profile: No results for input(s): INR, PROTIME in the last 168 hours. Cardiac Enzymes:  Recent Labs Lab 07/05/17 1822 07/05/17 2008 07/06/17 0906 07/06/17 1419 07/06/17 2115  TROPONINI 1.93* 1.92* 1.75* 1.48* 1.13*   BNP (last 3 results) No results for input(s): PROBNP in the last 8760 hours. HbA1C: No results for input(s): HGBA1C in the last 72 hours. CBG:  Recent Labs Lab 07/06/17 2052 07/07/17 0046 07/07/17 0430 07/07/17 0515 07/07/17 0759  GLUCAP 134* 65 61* 134* 84   Lipid Profile: No results for input(s): CHOL, HDL, LDLCALC, TRIG, CHOLHDL, LDLDIRECT in the last 72 hours. Thyroid Function Tests: No results for input(s): TSH, T4TOTAL, FREET4, T3FREE, THYROIDAB in the last 72 hours. Anemia Panel: No results for input(s): VITAMINB12, FOLATE, FERRITIN, TIBC, IRON, RETICCTPCT in the last 72 hours. Urine analysis:    Component Value Date/Time   COLORURINE YELLOW 10/30/2016 2339   APPEARANCEUR CLEAR  10/30/2016 2339   LABSPEC 1.010 10/30/2016 2339   PHURINE 5.0 10/30/2016 2339   GLUCOSEU NEGATIVE 10/30/2016 2339   HGBUR NEGATIVE 10/30/2016 2339   BILIRUBINUR NEGATIVE 10/30/2016 2339   KETONESUR NEGATIVE 10/30/2016 2339   PROTEINUR 100 (A) 10/30/2016 2339   NITRITE NEGATIVE 10/30/2016 2339   LEUKOCYTESUR NEGATIVE 10/30/2016 2339   Sepsis Labs: @LABRCNTIP (procalcitonin:4,lacticidven:4)  ) Recent Results (from the past 240  hour(s))  Blood culture (routine x 2)     Status: None (Preliminary result)   Collection Time: 07/05/17  1:18 AM  Result Value Ref Range Status   Specimen Description BLOOD RIGHT ANTECUBITAL  Final   Special Requests   Final    BOTTLES DRAWN AEROBIC AND ANAEROBIC Blood Culture adequate volume   Culture NO GROWTH 1 DAY  Final   Report Status PENDING  Incomplete  Blood culture (routine x 2)     Status: None (Preliminary result)   Collection Time: 07/05/17  2:16 AM  Result Value Ref Range Status   Specimen Description BLOOD RIGHT ARM  Final   Special Requests IN PEDIATRIC BOTTLE Blood Culture adequate volume  Final   Culture NO GROWTH 1 DAY  Final   Report Status PENDING  Incomplete  MRSA PCR Screening     Status: None   Collection Time: 07/05/17  3:09 PM  Result Value Ref Range Status   MRSA by PCR NEGATIVE NEGATIVE Final    Comment:        The GeneXpert MRSA Assay (FDA approved for NASAL specimens only), is one component of a comprehensive MRSA colonization surveillance program. It is not intended to diagnose MRSA infection nor to guide or monitor treatment for MRSA infections.          Radiology Studies: Dg Chest 2 View  Result Date: 07/06/2017 CLINICAL DATA:  Acute onset of central chest tightness. Initial encounter. EXAM: CHEST  2 VIEW COMPARISON:  Chest radiograph performed 07/05/2017 FINDINGS: The lungs are well-aerated. Minimal left basilar atelectasis is noted. There is no evidence of pleural effusion or pneumothorax. The heart is  borderline enlarged. The patient is status post median sternotomy, with evidence of prior CABG. No acute osseous abnormalities are seen. IMPRESSION: Minimal left basilar atelectasis noted. Lungs otherwise clear. Borderline cardiomegaly. Electronically Signed   By: Garald Balding M.D.   On: 07/06/2017 21:31        Scheduled Meds: . aspirin  81 mg Oral Daily  . atorvastatin  80 mg Oral q1800  . citalopram  20 mg Oral Daily  . clopidogrel  75 mg Oral Daily  . darbepoetin (ARANESP) injection - DIALYSIS  100 mcg Intravenous Q Mon-HD  . doxercalciferol  2 mcg Intravenous Q M,W,F-HD  . ezetimibe  10 mg Oral Daily  . gabapentin  300 mg Oral QHS  . heparin  5,000 Units Subcutaneous Q8H  . hydrALAZINE  25 mg Oral BID  . insulin pump   Subcutaneous Q4H  . isosorbide mononitrate  30 mg Oral Daily  . losartan  50 mg Oral Daily  . metoprolol succinate  25 mg Oral Daily  . pantoprazole  40 mg Oral Daily  . sodium chloride flush  3 mL Intravenous Q12H   Continuous Infusions: . sodium chloride    . sodium chloride       LOS: 2 days    Time spent: 35 minutes.      Edwin Dada, MD [facility] Pager [phone]  If 7PM-7AM, please contact night-coverage [email or web address] Password [email or web address] 07/07/2017, 8:54 AM

## 2017-07-07 NOTE — Interval H&P Note (Signed)
History and Physical Interval Note:  07/07/2017 12:55 PM  Kenneth [name]  has presented today for surgery, with the diagnosis of pa  The various methods of treatment have been discussed with the patient and family. After consideration of risks, benefits and other options for treatment, the patient has consented to  Procedure(s): LEFT HEART CATH AND CORS/GRAFTS ANGIOGRAPHY (N/A) as a surgical intervention .  [name] history has been reviewed, patient examined, no change in status, stable for surgery.  I have reviewed the patient's chart and labs.  Questions were answered to the patient's satisfaction.     [name] [employer]

## 2017-07-07 NOTE — Progress Notes (Signed)
Inpatient Diabetes Program Recommendations  AACE/ADA: New Consensus Statement on Inpatient Glycemic Control (2015)  Target Ranges:  Prepandial:   less than 140 mg/dL      Peak postprandial:   less than 180 mg/dL (1-2 hours)      Critically ill patients:  140 - 180 mg/dL   Lab Results  Component Value Date   GLUCAP 84 07/07/2017   HGBA1C 5.8 (H) 04/12/2017    Review of Glycemic ControlResults for HELTON, OLESON (MRN [id number]) as of 07/07/2017 09:05  Ref. Range 07/06/2017 20:52 07/07/2017 00:46 07/07/2017 04:30 07/07/2017 05:15 07/07/2017 07:59  Glucose-Capillary Latest Ref Range: 65 - 99 mg/dL 134 (H) 65 61 (L) 134 (H) 84   Diabetes history: Type 1 diabetes Outpatient Diabetes medications: Medtronic insulin pump Current orders for Inpatient glycemic control:  Insulin pump  Inpatient Diabetes Program Recommendations:    Patient is on insulin pump.  CBG's trending <100 mg/dL.  May consider holding insulin pump until after cath.  Will need to check blood sugars every 2 hours. If greater than 200 mg/dL, may need supplemental Novolog until insulin pump can be resumed.   Thanks, Adah Perl, RN, BC-ADM Inpatient Diabetes Coordinator Pager [phone] (8a-5p)

## 2017-07-07 NOTE — Progress Notes (Signed)
Pt BG 61. 25 mL 50% dextrose given. Will recheck BG in 15 minutes. Will continue to monitor pt.

## 2017-07-07 NOTE — Progress Notes (Signed)
Patient ID: Kenneth [name], male   DOB: May 02, 1956, 61 y.o.   MRN: [id number]     Advanced Heart Failure Rounding Note  Primary Cardiologist: Aundra Dubin  Subjective:    No dyspnea.  Had HD again yesterday, hypotension with HD limited fluid removal.  SBP in 120s currrently.    Still with very slight chest heaviness, has been present 2-3 weeks.     Troponin peaked at 2.6, not much trend.  PCT < 0.1.  Afebrile.   Echo: EF 55%, basal to mid inferior/inferoseptal/inferolateral akinesis.   Objective:   Weight Range: 145 lb 9.6 oz (66 kg) Body mass index is 24.99 kg/m.   Vital Signs:   Temp:  [97.5 F (36.4 C)-98.4 F (36.9 C)] 98 F (36.7 C) (10/24 0755) Pulse Rate:  [61-77] 63 (10/24 0755) Resp:  [5-21] 20 (10/24 0755) BP: (86-144)/(27-90) 122/56 (10/24 0755) SpO2:  [95 %-100 %] 99 % (10/24 0755) Weight:  [145 lb 1 oz (65.8 kg)-149 lb 4 oz (67.7 kg)] 145 lb 9.6 oz (66 kg) (10/24 0431)    Weight change: Filed Weights   07/06/17 1420 07/06/17 1740 07/07/17 0431  Weight: 149 lb 4 oz (67.7 kg) 145 lb 1 oz (65.8 kg) 145 lb 9.6 oz (66 kg)    Intake/Output:   Intake/Output Summary (Last 24 hours) at 07/07/17 0811 Last data filed at 07/06/17 2300  Gross per 24 hour  Intake             1062 ml  Output             1535 ml  Net             -473 ml      Physical Exam    General: NAD Neck: No JVD, no thyromegaly or thyroid nodule.  Lungs: Clear to auscultation bilaterally with normal respiratory effort. CV: Nondisplaced PMI.  Heart regular S1/S2, no S3/S4, murmur noted across precordium, suspect from AV fistula.  No peripheral edema.  No carotid bruit.  Normal pedal pulses.  Abdomen: Soft, nontender, no hepatosplenomegaly, no distention.  Skin: Intact without lesions or rashes.  Neurologic: Alert and oriented x 3.  Psych: Normal affect. Extremities: No clubbing or cyanosis.  HEENT: Normal.   Telemetry   NSR (personally reviewed)  Labs    CBC  Recent Labs   07/05/17 0117  07/05/17 0630 07/06/17 0250  WBC 6.6  --  6.9 5.8  NEUTROABS 4.1  --   --   --   HGB 9.5*  < > 9.4* 9.8*  HCT 29.0*  < > 27.7* 30.0*  MCV 92.7  --  92.3 91.7  PLT 140*  --  159 115*  < > = values in this interval not displayed. Basic Metabolic Panel  Recent Labs  07/06/17 0250 07/07/17 0350  NA 133* 135  K 5.0 4.8  CL 93* 96*  CO2 28 29  GLUCOSE 154* 56*  BUN 24* 19  CREATININE 4.66* 4.08*  CALCIUM 8.3* 8.7*  PHOS  --  3.9   Liver Function Tests  Recent Labs  07/05/17 0117 07/07/17 0350  AST 61*  --   ALT 51  --   ALKPHOS 118  --   BILITOT 0.8  --   PROT 7.0  --   ALBUMIN 3.5 3.4*   No results for input(s): LIPASE, AMYLASE in the last 72 hours. Cardiac Enzymes  Recent Labs  07/06/17 0906 07/06/17 1419 07/06/17 2115  TROPONINI 1.75* 1.48* 1.13*    BNP:  BNP (last 3 results)  Recent Labs  10/30/16 2024 12/15/16 2148 04/11/17 1529  BNP 1,900.0* 881.1* 1,724.5*    ProBNP (last 3 results) No results for input(s): PROBNP in the last 8760 hours.   D-Dimer No results for input(s): DDIMER in the last 72 hours. Hemoglobin A1C No results for input(s): HGBA1C in the last 72 hours. Fasting Lipid Panel No results for input(s): CHOL, HDL, LDLCALC, TRIG, CHOLHDL, LDLDIRECT in the last 72 hours. Thyroid Function Tests No results for input(s): TSH, T4TOTAL, T3FREE, THYROIDAB in the last 72 hours.  Invalid input(s): FREET3  Other results:   Imaging    Dg Chest 2 View  Result Date: 07/06/2017 CLINICAL DATA:  Acute onset of central chest tightness. Initial encounter. EXAM: CHEST  2 VIEW COMPARISON:  Chest radiograph performed 07/05/2017 FINDINGS: The lungs are well-aerated. Minimal left basilar atelectasis is noted. There is no evidence of pleural effusion or pneumothorax. The heart is borderline enlarged. The patient is status post median sternotomy, with evidence of prior CABG. No acute osseous abnormalities are seen. IMPRESSION:  Minimal left basilar atelectasis noted. Lungs otherwise clear. Borderline cardiomegaly. Electronically Signed   By: Garald Balding M.D.   On: 07/06/2017 21:31     Medications:     Scheduled Medications: . aspirin  81 mg Oral Daily  . atorvastatin  80 mg Oral q1800  . citalopram  20 mg Oral Daily  . clopidogrel  75 mg Oral Daily  . darbepoetin (ARANESP) injection - DIALYSIS  100 mcg Intravenous Q Mon-HD  . doxercalciferol  2 mcg Intravenous Q M,W,F-HD  . ezetimibe  10 mg Oral Daily  . gabapentin  300 mg Oral QHS  . heparin  5,000 Units Subcutaneous Q8H  . hydrALAZINE  25 mg Oral BID  . insulin pump   Subcutaneous Q4H  . isosorbide mononitrate  30 mg Oral Daily  . losartan  50 mg Oral Daily  . metoprolol succinate  25 mg Oral Daily  . pantoprazole  40 mg Oral Daily  . sodium chloride flush  3 mL Intravenous Q12H    Infusions: . sodium chloride    . sodium chloride      PRN Medications: sodium chloride, acetaminophen **OR** acetaminophen, hydrALAZINE, nitroGLYCERIN, ondansetron **OR** ondansetron (ZOFRAN) IV, sodium chloride flush    Patient Profile   Kenneth [name] is a 61 y.o. male with h/o of CKD IV requiring HD, CAD s/p CABG x 3 05/2016, Transient Post-op Afib, DM2, and HTN.  Admitted 07/05/17 with acute CP and SOB, elevated troponin.   Assessment/Plan   1. Acute pulmonary edema: Echo at this admission shows that EF remains 55%, EF recovered after CABG. ?Trigger => hypertensive emergency perhaps versus ischemic event. Volume status looks improved after 2 HD sessions  2. CAD: s/p CABG in 9/17 with LIMA-LAD, SVG-OM, SVG-RCA.  Has been taking meds.  Troponin elevated but fairly flat trend and ECG not significantly changed => true NSTEMI with plaque rupture versus demand ischemia from hypertensive emergency/pulmonary edema in setting of ESRD. Symptomatically much improved after 2 HD sessions.  He was not started on heparin gtt at admission.  Echo showed preserved EF but there  are wall motion abnormalities in the RCA distribution not mentioned on prior echo.  - Continue ASA 81, Plavix 75, statin, Toprol XL.   - We will plan on cardiac cath today.  I have explained risks/benefits to patient with help of interpreter (yesterday) and he agrees to proceed.  3. Diabetes: Has insulin pump.  4.  ESRD: Per renal.  Has had 2 sessions so far for fluid removal.  5. ID: No definite signs of infection, afebrile + normal WBCs + PCT not elevated.  6. HTN: BP controlled this morning on current regimen, continue. Low BP at HD, will hold hydralazine on HD days.   Length of Stay: 2  Loralie Champagne, MD  07/07/2017, 8:11 AM  Advanced Heart Failure Team Pager [phone] (M-F; 7a - 4p)  Please contact [facility] for night-coverage after hours (4p -7a ) and weekends on [email or web address]

## 2017-07-07 NOTE — Care Management Note (Signed)
Case Management Note  Patient Details  Name: Kenneth [name] MRN: [id number] Date of Birth: 06-04-1956  Subjective/Objective:    From home, s/p coronary stent intervention, will be on plavix.                Action/Plan: NCM will follow for dc needs.  Expected Discharge Date:                  Expected Discharge Plan:  Home/Self Care  In-House Referral:     Discharge planning Services  CM Consult  Post Acute Care Choice:    Choice offered to:     DME Arranged:    DME Agency:     HH Arranged:    [facility] Agency:     Status of Service:  Completed, signed off  If discussed at [employer] of Stay Meetings, dates discussed:    Additional Comments:  Zenon Mayo, RN 07/07/2017, 3:24 PM

## 2017-07-07 NOTE — Progress Notes (Signed)
[facility] Rounding Note  Subjective: f/u CXR last night should resolution of edema.  Just back from heart cath and PCI/ stent to SVG to OM.  LVEDP was 26.  No SOB or cough.    Objective Vital signs in last 24 hours: Vitals:   07/07/17 1426 07/07/17 1431 07/07/17 1436 07/07/17 1512  BP: (!) 144/66 132/68  (!) 151/56  Pulse: 63 63 62 60  Resp: 20 17 (!) 22 14  Temp:    98.3 F (36.8 C)  TempSrc:    Oral  SpO2: 97% 97% 96% 96%  Weight:      Height:       Weight change:   Intake/Output Summary (Last 24 hours) at 07/07/17 1626 Last data filed at 07/07/17 1100  Gross per 24 hour  Intake              462 ml  Output             1535 ml  Net            -1073 ml   Physical Exam:  Blood pressure (!) 151/56, pulse 60, temperature 98.3 F (36.8 C), temperature source Oral, resp. rate 14, height 5\' 7"  (1.702 m), weight 66 kg (145 lb 9.6 oz), SpO2 96 %.  Pt tired after heart cath No jvd Chest is clear bilat S1S2 No S3 Abd soft and NT No pretibial edema NF, Ox 3    Dialysis: MWF AF 4h   2/2.5  64kg   L AVF  Hep 4500 -hect 2 ug -mircera 75 q 2 wks  Assessment: 1. NSTEMI - hx CABG, sp heart cath today with PCI/ stent to one of the SV grafts 2. Pulm edema/ acute resp distress- completely resolved, f/u CXR last night after HD was clear 3. ESRD - usual MWF HD. Had short HD last night for resp distress, only got 1.5 L off w/ BP dropping. F/U CXR had cleared up last night, this suggests pulm edema due to ischemia, not vol overload.  Pt now quite stable, LVEDP is up some, no need HD tonight , still has sheaths in. Plan HD in am tomorrow.   4. HTN urgency - resolved 5. 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 Check phos. Not done as ordered. Confirm binder doses. 7. IDDM - insulin pump. Primary managing DM   Plan - hold on HD tonight, plan HD tomorrow off schedule. UF to dry wt.   Kelly Splinter MD [employer] pgr [phone]    07/07/2017, 4:33 PM       Recent Labs Lab 07/05/17 0117 07/05/17 0132 07/05/17 0630 07/06/17 0250 07/07/17 0350  NA 134* 137  --  133* 135  K 5.5* 5.5*  --  5.0 4.8  CL 100* 100*  --  93* 96*  CO2 26  --   --  28 29  GLUCOSE 161* 162*  --  154* 56*  BUN 38* 37*  --  24* 19  CREATININE 6.76* 7.00* 7.01* 4.66* 4.08*  CALCIUM 8.4*  --   --  8.3* 8.7*  PHOS  --   --   --   --  3.9    Recent Labs Lab 07/05/17 0117 07/07/17 0350  AST 61*  --   ALT 51  --   ALKPHOS 118  --   BILITOT 0.8  --   PROT 7.0  --   ALBUMIN 3.5 3.4*  Recent Labs Lab 07/05/17 0117 07/05/17 0132 07/05/17 0630 07/06/17 0250  WBC 6.6  --  6.9 5.8  NEUTROABS 4.1  --   --   --   HGB 9.5* 9.9* 9.4* 9.8*  HCT 29.0* 29.0* 27.7* 30.0*  MCV 92.7  --  92.3 91.7  PLT 140*  --  159 115*    Recent Labs Lab 07/05/17 1822 07/05/17 2008 07/06/17 0906 07/06/17 1419 07/06/17 2115  TROPONINI 1.93* 1.92* 1.75* 1.48* 1.13*     Recent Labs Lab 07/07/17 0430 07/07/17 0515 07/07/17 0759 07/07/17 1036 07/07/17 1154  GLUCAP 61* 134* 84 82 90   Studies/Results: Dg Chest 2 View  Result Date: 07/06/2017 CLINICAL DATA:  Acute onset of central chest tightness. Initial encounter. EXAM: CHEST  2 VIEW COMPARISON:  Chest radiograph performed 07/05/2017 FINDINGS: The lungs are well-aerated. Minimal left basilar atelectasis is noted. There is no evidence of pleural effusion or pneumothorax. The heart is borderline enlarged. The patient is status post median sternotomy, with evidence of prior CABG. No acute osseous abnormalities are seen. IMPRESSION: Minimal left basilar atelectasis noted. Lungs otherwise clear. Borderline cardiomegaly. Electronically Signed   By: Garald Balding M.D.   On: 07/06/2017 21:31   Medications: . sodium chloride     . aspirin  81 mg Oral Daily  . atorvastatin  80 mg Oral q1800  . citalopram  20 mg Oral Daily  . clopidogrel  75 mg Oral Daily  . darbepoetin (ARANESP)  injection - DIALYSIS  100 mcg Intravenous Q Mon-HD  . doxercalciferol  2 mcg Intravenous Q M,W,F-HD  . ezetimibe  10 mg Oral Daily  . gabapentin  300 mg Oral QHS  . heparin  5,000 Units Subcutaneous Q8H  . hydrALAZINE  25 mg Oral BID  . insulin pump   Subcutaneous Q4H  . isosorbide mononitrate  30 mg Oral Daily  . losartan  50 mg Oral Daily  . metoprolol succinate  25 mg Oral Daily  . pantoprazole  40 mg Oral Daily  . sodium chloride flush  3 mL Intravenous Q12H   . sodium chloride

## 2017-07-07 NOTE — H&P (View-Only) (Signed)
Patient ID: RANGER PETRICH, male   DOB: [birth year], 61 y.o.   MRN: [id number]     Advanced Heart Failure Rounding Note  Primary Cardiologist: Aundra Dubin  Subjective:    No dyspnea.  Had HD again yesterday, hypotension with HD limited fluid removal.  SBP in 120s currrently.    Still with very slight chest heaviness, has been present 2-3 weeks.     Troponin peaked at 2.6, not much trend.  PCT < 0.1.  Afebrile.   Echo: EF 55%, basal to mid inferior/inferoseptal/inferolateral akinesis.   Objective:   Weight Range: 145 lb 9.6 oz (66 kg) Body mass index is 24.99 kg/m.   Vital Signs:   Temp:  [97.5 F (36.4 C)-98.4 F (36.9 C)] 98 F (36.7 C) (10/24 0755) Pulse Rate:  [61-77] 63 (10/24 0755) Resp:  [5-21] 20 (10/24 0755) BP: (86-144)/(27-90) 122/56 (10/24 0755) SpO2:  [95 %-100 %] 99 % (10/24 0755) Weight:  [145 lb 1 oz (65.8 kg)-149 lb 4 oz (67.7 kg)] 145 lb 9.6 oz (66 kg) (10/24 0431)    Weight change: Filed Weights   07/06/17 1420 07/06/17 1740 07/07/17 0431  Weight: 149 lb 4 oz (67.7 kg) 145 lb 1 oz (65.8 kg) 145 lb 9.6 oz (66 kg)    Intake/Output:   Intake/Output Summary (Last 24 hours) at 07/07/17 0811 Last data filed at 07/06/17 2300  Gross per 24 hour  Intake             1062 ml  Output             1535 ml  Net             -473 ml      Physical Exam    General: NAD Neck: No JVD, no thyromegaly or thyroid nodule.  Lungs: Clear to auscultation bilaterally with normal respiratory effort. CV: Nondisplaced PMI.  Heart regular S1/S2, no S3/S4, murmur noted across precordium, suspect from AV fistula.  No peripheral edema.  No carotid bruit.  Normal pedal pulses.  Abdomen: Soft, nontender, no hepatosplenomegaly, no distention.  Skin: Intact without lesions or rashes.  Neurologic: Alert and oriented x 3.  Psych: Normal affect. Extremities: No clubbing or cyanosis.  HEENT: Normal.   Telemetry   NSR (personally reviewed)  Labs    CBC  Recent Labs   07/05/17 0117  07/05/17 0630 07/06/17 0250  WBC 6.6  --  6.9 5.8  NEUTROABS 4.1  --   --   --   HGB 9.5*  < > 9.4* 9.8*  HCT 29.0*  < > 27.7* 30.0*  MCV 92.7  --  92.3 91.7  PLT 140*  --  159 115*  < > = values in this interval not displayed. Basic Metabolic Panel  Recent Labs  07/06/17 0250 07/07/17 0350  NA 133* 135  K 5.0 4.8  CL 93* 96*  CO2 28 29  GLUCOSE 154* 56*  BUN 24* 19  CREATININE 4.66* 4.08*  CALCIUM 8.3* 8.7*  PHOS  --  3.9   Liver Function Tests  Recent Labs  07/05/17 0117 07/07/17 0350  AST 61*  --   ALT 51  --   ALKPHOS 118  --   BILITOT 0.8  --   PROT 7.0  --   ALBUMIN 3.5 3.4*   No results for input(s): LIPASE, AMYLASE in the last 72 hours. Cardiac Enzymes  Recent Labs  07/06/17 0906 07/06/17 1419 07/06/17 2115  TROPONINI 1.75* 1.48* 1.13*    BNP:  BNP (last 3 results)  Recent Labs  10/30/16 2024 12/15/16 2148 04/11/17 1529  BNP 1,900.0* 881.1* 1,724.5*    ProBNP (last 3 results) No results for input(s): PROBNP in the last 8760 hours.   D-Dimer No results for input(s): DDIMER in the last 72 hours. Hemoglobin A1C No results for input(s): HGBA1C in the last 72 hours. Fasting Lipid Panel No results for input(s): CHOL, HDL, LDLCALC, TRIG, CHOLHDL, LDLDIRECT in the last 72 hours. Thyroid Function Tests No results for input(s): TSH, T4TOTAL, T3FREE, THYROIDAB in the last 72 hours.  Invalid input(s): FREET3  Other results:   Imaging    Dg Chest 2 View  Result Date: 07/06/2017 CLINICAL DATA:  Acute onset of central chest tightness. Initial encounter. EXAM: CHEST  2 VIEW COMPARISON:  Chest radiograph performed 07/05/2017 FINDINGS: The lungs are well-aerated. Minimal left basilar atelectasis is noted. There is no evidence of pleural effusion or pneumothorax. The heart is borderline enlarged. The patient is status post median sternotomy, with evidence of prior CABG. No acute osseous abnormalities are seen. IMPRESSION:  Minimal left basilar atelectasis noted. Lungs otherwise clear. Borderline cardiomegaly. Electronically Signed   By: Garald Balding M.D.   On: 07/06/2017 21:31     Medications:     Scheduled Medications: . aspirin  81 mg Oral Daily  . atorvastatin  80 mg Oral q1800  . citalopram  20 mg Oral Daily  . clopidogrel  75 mg Oral Daily  . darbepoetin (ARANESP) injection - DIALYSIS  100 mcg Intravenous Q Mon-HD  . doxercalciferol  2 mcg Intravenous Q M,W,F-HD  . ezetimibe  10 mg Oral Daily  . gabapentin  300 mg Oral QHS  . heparin  5,000 Units Subcutaneous Q8H  . hydrALAZINE  25 mg Oral BID  . insulin pump   Subcutaneous Q4H  . isosorbide mononitrate  30 mg Oral Daily  . losartan  50 mg Oral Daily  . metoprolol succinate  25 mg Oral Daily  . pantoprazole  40 mg Oral Daily  . sodium chloride flush  3 mL Intravenous Q12H    Infusions: . sodium chloride    . sodium chloride      PRN Medications: sodium chloride, acetaminophen **OR** acetaminophen, hydrALAZINE, nitroGLYCERIN, ondansetron **OR** ondansetron (ZOFRAN) IV, sodium chloride flush    Patient Profile   VU LIEBMAN is a 62 y.o. male with h/o of CKD IV requiring HD, CAD s/p CABG x 3 05/2016, Transient Post-op Afib, DM2, and HTN.  Admitted 07/05/17 with acute CP and SOB, elevated troponin.   Assessment/Plan   1. Acute pulmonary edema: Echo at this admission shows that EF remains 55%, EF recovered after CABG. ?Trigger => hypertensive emergency perhaps versus ischemic event. Volume status looks improved after 2 HD sessions  2. CAD: s/p CABG in 9/17 with LIMA-LAD, SVG-OM, SVG-RCA.  Has been taking meds.  Troponin elevated but fairly flat trend and ECG not significantly changed => true NSTEMI with plaque rupture versus demand ischemia from hypertensive emergency/pulmonary edema in setting of ESRD. Symptomatically much improved after 2 HD sessions.  He was not started on heparin gtt at admission.  Echo showed preserved EF but there  are wall motion abnormalities in the RCA distribution not mentioned on prior echo.  - Continue ASA 81, Plavix 75, statin, Toprol XL.   - We will plan on cardiac cath today.  I have explained risks/benefits to patient with help of interpreter (yesterday) and he agrees to proceed.  3. Diabetes: Has insulin pump.  4.  ESRD: Per renal.  Has had 2 sessions so far for fluid removal.  5. ID: No definite signs of infection, afebrile + normal WBCs + PCT not elevated.  6. HTN: BP controlled this morning on current regimen, continue. Low BP at HD, will hold hydralazine on HD days.   Length of Stay: 2  Loralie Champagne, MD  07/07/2017, 8:11 AM  Advanced Heart Failure Team Pager [phone] (M-F; 7a - 4p)  Please contact [facility] for night-coverage after hours (4p -7a ) and weekends on [email or web address]

## 2017-07-07 NOTE — Progress Notes (Signed)
Pt BG was 134 when rechecked at 0515. Will continue to monitor.

## 2017-07-07 NOTE — Progress Notes (Signed)
Spoke with RN.  Patient just back from cath.  He is still drowsy. Recommended checking CBG's q 2 hours and resume insulin pump once patient alert and able.  If blood sugars>200 mg/dL, and patient unable to restart insulin pump, may need basal/bolus regimen instead.  Will follow.  Thanks, Adah Perl, RN, BC-ADM Inpatient Diabetes Coordinator Pager [phone] (8a-5p)

## 2017-07-07 NOTE — Progress Notes (Signed)
Patient's insulin pump stopped per diabetes coordinator/physician. Will continue to monitor BG every 2 hours.

## 2017-07-08 ENCOUNTER — Encounter (HOSPITAL_COMMUNITY): Payer: Self-pay | Admitting: Cardiology

## 2017-07-08 DIAGNOSIS — Z951 Presence of aortocoronary bypass graft: Secondary | ICD-10-CM

## 2017-07-08 DIAGNOSIS — I2510 Atherosclerotic heart disease of native coronary artery without angina pectoris: Secondary | ICD-10-CM

## 2017-07-08 LAB — BASIC METABOLIC PANEL
Anion gap: 9 (ref 5–15)
BUN: 33 mg/dL — AB (ref 6–20)
CHLORIDE: 93 mmol/L — AB (ref 101–111)
CO2: 27 mmol/L (ref 22–32)
CREATININE: 6.13 mg/dL — AB (ref 0.61–1.24)
Calcium: 8.2 mg/dL — ABNORMAL LOW (ref 8.9–10.3)
GFR calc Af Amer: 10 mL/min — ABNORMAL LOW (ref >60)
GFR, EST NON AFRICAN AMERICAN: 9 mL/min — AB (ref >60)
GLUCOSE: 197 mg/dL — AB (ref 65–99)
POTASSIUM: 6.7 mmol/L — AB (ref 3.5–5.1)
Sodium: 129 mmol/L — ABNORMAL LOW (ref 135–145)

## 2017-07-08 LAB — CBC
HCT: 29.5 % — ABNORMAL LOW (ref 39.0–52.0)
Hemoglobin: 9.5 g/dL — ABNORMAL LOW (ref 13.0–17.0)
MCH: 29.4 pg (ref 26.0–34.0)
MCHC: 32.2 g/dL (ref 30.0–36.0)
MCV: 91.3 fL (ref 78.0–100.0)
PLATELETS: 118 10*3/uL — AB (ref 150–400)
RBC: 3.23 MIL/uL — AB (ref 4.22–5.81)
RDW: 14.3 % (ref 11.5–15.5)
WBC: 5.7 10*3/uL (ref 4.0–10.5)

## 2017-07-08 LAB — GLUCOSE, CAPILLARY
GLUCOSE-CAPILLARY: 68 mg/dL (ref 65–99)
GLUCOSE-CAPILLARY: 204 mg/dL — AB (ref 65–99)
GLUCOSE-CAPILLARY: 187 mg/dL — AB (ref 65–99)
GLUCOSE-CAPILLARY: 101 mg/dL — AB (ref 65–99)
Glucose-Capillary: 46 mg/dL — ABNORMAL LOW (ref 65–99)
Glucose-Capillary: 87 mg/dL (ref 65–99)

## 2017-07-08 MED ORDER — DEXTROSE 50 % IV SOLN
INTRAVENOUS | Status: AC
  Filled 2017-07-08: qty 50

## 2017-07-08 MED ORDER — INSULIN ASPART 100 UNIT/ML ~~LOC~~ SOLN: 0.0000 [IU] | SUBCUTANEOUS | Status: DC

## 2017-07-08 MED ORDER — DOXERCALCIFEROL 4 MCG/2ML IV SOLN
INTRAVENOUS | Status: AC
  Filled 2017-07-08: qty 2

## 2017-07-08 MED ORDER — HYDRALAZINE HCL 25 MG PO TABS: 25.0000 mg | ORAL_TABLET | Freq: Two times a day (BID) | ORAL | 3 refills | Status: DC

## 2017-07-08 MED ORDER — DEXTROSE 50 % IV SOLN
12.5000 g | Freq: Once | INTRAVENOUS | Status: AC
  Administered 2017-07-08: 02:00:00 12.5 g via INTRAVENOUS

## 2017-07-08 NOTE — Progress Notes (Signed)
Patient ID: Kenneth [name], male   DOB: [birth year], 61 y.o.   MRN: [id number]     Advanced Heart Failure Rounding Note  Primary Cardiologist: Aundra Dubin  Subjective:    No dyspnea or chest pain.  Feels good today, seen at HD.   Echo: EF 55%, basal to mid inferior/inferoseptal/inferolateral akinesis.   Coronary Findings (cath 10/24)  Dominance: Right  Left Main  40% mid to distal LM stenosis.  Left Anterior Descending  70% proximal LAD stenosis, 90% mid LAD stenosis. Patent LIMA-LAD (LIMA noted to have loops).  Left Circumflex  Small AV LCx was diffusely diseased. There was a large OM1 with 95% ostial-proximal stenosis. SVG-OM1 had an ostial 95% stenosis with TIMI 2 flow.  Right Coronary Artery  Native RCA occluded proximally with right to right collaterals. There were also LCx - PDA collaterals. The SVG-RCA is occluded (was thought to be occluded on last cath).  Patient had DES to SVG-OM1.    Objective:   Weight Range: 147 lb 0.8 oz (66.7 kg) Body mass index is 23.03 kg/m.   Vital Signs:   Temp:  [97.3 F (36.3 C)-98.4 F (36.9 C)] 97.4 F (36.3 C) (10/25 0722) Pulse Rate:  [53-66] (P) 55 (10/25 0945) Resp:  [10-45] (P) 12 (10/25 0945) BP: (96-162)/(46-96) (P) 90/53 (10/25 0945) SpO2:  [94 %-99 %] 95 % (10/25 0722) Weight:  [147 lb 0.8 oz (66.7 kg)] 147 lb 0.8 oz (66.7 kg) (10/25 0722) Last BM Date: 07/06/17  Weight change: Filed Weights   07/06/17 1740 07/07/17 0431 07/08/17 0722  Weight: 145 lb 1 oz (65.8 kg) 145 lb 9.6 oz (66 kg) 147 lb 0.8 oz (66.7 kg)    Intake/Output:   Intake/Output Summary (Last 24 hours) at 07/08/17 1033 Last data filed at 07/07/17 2353  Gross per 24 hour  Intake              413 ml  Output                0 ml  Net              413 ml      Physical Exam    General: NAD Neck: No JVD, no thyromegaly or thyroid nodule.  Lungs: Clear to auscultation bilaterally with normal respiratory effort. CV: Nondisplaced PMI.  Heart regular S1/S2,  no S3/S4, murmur noted across precordium, likely from fistula.  No peripheral edema.  No carotid bruit.  Normal pedal pulses.  Abdomen: Soft, nontender, no hepatosplenomegaly, no distention.  Skin: Intact without lesions or rashes.  Neurologic: Alert and oriented x 3.  Psych: Normal affect. Extremities: No clubbing or cyanosis. Right groin cath site benign.  HEENT: Normal.    Telemetry   NSR (personally reviewed)  Labs    CBC  Recent Labs  07/06/17 0250 07/08/17 0429  WBC 5.8 5.7  HGB 9.8* 9.5*  HCT 30.0* 29.5*  MCV 91.7 91.3  PLT 115* 482*   Basic Metabolic Panel  Recent Labs  07/07/17 0350 07/08/17 0429  NA 135 129*  K 4.8 6.7*  CL 96* 93*  CO2 29 27  GLUCOSE 56* 197*  BUN 19 33*  CREATININE 4.08* 6.13*  CALCIUM 8.7* 8.2*  PHOS 3.9  --    Liver Function Tests  Recent Labs  07/07/17 0350  ALBUMIN 3.4*   No results for input(s): LIPASE, AMYLASE in the last 72 hours. Cardiac Enzymes  Recent Labs  07/06/17 0906 07/06/17 1419 07/06/17 2115  TROPONINI 1.75* 1.48*  1.13*    BNP: BNP (last 3 results)  Recent Labs  10/30/16 2024 12/15/16 2148 04/11/17 1529  BNP 1,900.0* 881.1* 1,724.5*    ProBNP (last 3 results) No results for input(s): PROBNP in the last 8760 hours.   D-Dimer No results for input(s): DDIMER in the last 72 hours. Hemoglobin A1C No results for input(s): HGBA1C in the last 72 hours. Fasting Lipid Panel No results for input(s): CHOL, HDL, LDLCALC, TRIG, CHOLHDL, LDLDIRECT in the last 72 hours. Thyroid Function Tests No results for input(s): TSH, T4TOTAL, T3FREE, THYROIDAB in the last 72 hours.  Invalid input(s): FREET3  Other results:   Imaging    No results found.   Medications:     Scheduled Medications: . aspirin  81 mg Oral Daily  . atorvastatin  80 mg Oral q1800  . citalopram  20 mg Oral Daily  . clopidogrel  75 mg Oral Daily  . darbepoetin (ARANESP) injection - DIALYSIS  100 mcg Intravenous Q Mon-HD    . doxercalciferol      . doxercalciferol  2 mcg Intravenous Q M,W,F-HD  . ezetimibe  10 mg Oral Daily  . gabapentin  300 mg Oral QHS  . heparin  5,000 Units Subcutaneous Q8H  . hydrALAZINE  25 mg Oral BID  . insulin aspart  0-9 Units Subcutaneous Q4H  . insulin pump   Subcutaneous Q4H  . isosorbide mononitrate  30 mg Oral Daily  . metoprolol succinate  25 mg Oral Daily  . pantoprazole  40 mg Oral Daily  . sodium chloride flush  3 mL Intravenous Q12H    Infusions: . sodium chloride      PRN Medications: sodium chloride, acetaminophen **OR** acetaminophen, hydrALAZINE, nitroGLYCERIN, ondansetron **OR** ondansetron (ZOFRAN) IV, sodium chloride flush    Patient Profile   Kenneth [name] is a 61 y.o. male with h/o of CKD IV requiring HD, CAD s/p CABG x 3 05/2016, Transient Post-op Afib, DM2, and HTN.  Admitted 07/05/17 with acute CP and SOB, elevated troponin.   Assessment/Plan   1. Acute pulmonary edema: Echo at this admission shows that EF remains 55%, EF recovered after CABG. Trigger may have been NSTEMI/ischemia. Volume status improved with HD.  2. CAD: s/p CABG in 9/17 with LIMA-LAD, SVG-OM, SVG-RCA.  Has been taking meds.  NSTEMI this admission.  Echo showed preserved EF but there are wall motion abnormalities in the RCA distribution not mentioned on prior echo. Cath yesterday showed 95% ostial SVG-OM1 stenosis with TIMI 2 flow in graft.  Patient had DES to SVG-OM1.  - Continue ASA 81, Plavix 75, statin, Toprol XL.   - I will arrange for cardiac rehab as an outpatient.   3. Diabetes: Has insulin pump.  4. ESRD: Per renal.  At HD currently.  5. ID: No definite signs of infection, afebrile + normal WBCs + PCT not elevated.  6. HTN: SBP 140s-150s but drops with HD.  Continue current meds.  7. Disposition: I think that he can go home today from cardiac standpoint.  He will need to continue the current cardiac regimen that he is getting in the hospital.   Length of Stay:  3  Loralie Champagne, MD  07/08/2017, 10:33 AM  Advanced Heart Failure Team Pager [phone] (M-F; 7a - 4p)  Please contact [facility] for night-coverage after hours (4p -7a ) and weekends on [email or web address]

## 2017-07-08 NOTE — Progress Notes (Addendum)
  Critical Values K 6.7 & CBG trends Now 197  Date of notification:  07/08/2017  Time of notification: 06:15  Critical value read back: YES  Nurse who received alert:  Malakhai Beitler RN  MD notified (1st page):  Dr Hal Hope  Time of first page: 06:16MD notified (2nd page):  Time of second page:  Responding MD: Dr.Kakrakandy  Time MD responded: 06:18  Dr Hal Hope made aware that Insulin pump remains on hold because he is not awake enough to use it. New orders received CBGs q4hrs with Sensitive Sliding scale. Orders read back and confirmed. No new orders. Pt is first case for dialysis and K to be addressed there, per Dr Hal Hope

## 2017-07-08 NOTE — Progress Notes (Signed)
Report given to Dialysis RN Lito. Made aware of CBG trends and critical lab values. Pt now on sensitive SS Pt awake and alert.

## 2017-07-08 NOTE — Progress Notes (Signed)
CARDIAC REHAB PHASE I   Pt ambulated earlier with RN, no complaints.  Completed MI/stent education with pt and wife at bedside with [name] present.  Reviewed risk factors, MI/PCI book, anti-platelet therapy, stent card, activity restrictions, ntg, exercise, and phase 2 cardiac rehab. Pt and wife verbalized understanding.Pt is on dialysis, will defer diet to renal diet. Pt agrees to phase 2 cardiac rehab referral, will send to [facility] per pt request. Pt in bed, call bell within reach.    [place], RN, BSN 07/08/2017 3:18 PM

## 2017-07-08 NOTE — Progress Notes (Addendum)
Inpatient Diabetes Program Recommendations  AACE/ADA: New Consensus Statement on Inpatient Glycemic Control (2015)  Target Ranges:  Prepandial:   less than 140 mg/dL      Peak postprandial:   less than 180 mg/dL (1-2 hours)      Critically ill patients:  140 - 180 mg/dL   Lab Results  Component Value Date   GLUCAP 187 (H) 07/08/2017   HGBA1C 5.8 (H) 04/12/2017    Review of Glycemic ControlResults for MANDEL, SEIDEN (MRN [id number]) as of 07/08/2017 10:53  Ref. Range 07/07/2017 21:11 07/08/2017 02:01 07/08/2017 02:14 07/08/2017 02:31 07/08/2017 04:05  Glucose-Capillary Latest Ref Range: 65 - 99 mg/dL 221 (H) 46 (L) 68 204 (H) 187 (H)   Diabetes history: Type 1 diabetes Outpatient Diabetes medications: Medtronic insulin pump Current orders for Inpatient glycemic control:  Insulin pump/ Novolog sensitive q 4hours Inpatient Diabetes Program Recommendations:    Note that patient had low blood sugar overnight after correcting with insulin pump using 3 units of Novolog.  Insulin pump was held and low blood sugar treated.  Called dialysis unit at 10:55 am- RN states that patient is wearing insulin pump at this time and that it is infusing.  Requested RN to check CBG.  Patient needs to avoid low blood sugars.  Will ask him to follow-up with Dr. Hartford Poli regarding insulin pump and possible adjustments.    Thanks, Adah Perl, RN, BC-ADM Inpatient Diabetes Coordinator Pager [phone] ([phone]- Spoke with patient and wife.  Asked patient if he has lows at home.  He states no.  I told him that it is important to prevent low blood sugars and that if blood sugars are low at home, he needs to call endocrinologist Dr. Hartford Poli.  Patient and wife verbalize understanding.

## 2017-07-08 NOTE — Progress Notes (Addendum)
CBG 46.Pt awake and alert . Pt given 1 cup of apple juice with sugar. CBG rechecked= 68. 1/2 of an amp D50 administered slowly  for change in mental status. Dr Hal Hope paged and made aware of CBG trends throughout the day. 02:30 CBG 204 To be repeated in 1 hour per MD

## 2017-07-08 NOTE — Progress Notes (Signed)
[facility] Rounding Note  Subjective: stable, no c/o's this am. Sheaths out, on HD  Objective Vital signs in last 24 hours: Vitals:   07/08/17 0930 07/08/17 0945 07/08/17 1000 07/08/17 1030  BP: (!) 111/54 (!) 90/53 (!) 109/52 (!) 91/52  Pulse: (!) 54 (!) 55 (!) 56 (!) 54  Resp: 15 12 12 14   Temp:      TempSrc:      SpO2:      Weight:      Height:       Weight change:   Intake/Output Summary (Last 24 hours) at 07/08/17 1119 Last data filed at 07/07/17 2353  Gross per 24 hour  Intake              413 ml  Output                0 ml  Net              413 ml   Physical Exam:  Blood pressure (!) 91/52, pulse (!) 54, temperature (!) 97.4 F (36.3 C), temperature source Oral, resp. rate 14, height 5\' 7"  (1.702 m), weight 66.7 kg (147 lb 0.8 oz), SpO2 95 %.  Pt tired after heart cath No jvd Chest is clear bilat S1S2 No S3 Abd soft and NT No pretibial edema NF, Ox 3    Dialysis: MWF AF 4h   2/2.5  64kg   L AVF  Hep 4500 -hect 2 ug -mircera 75 q 2 wks  Assessment: 1. NSTEMI - hx CABG, sp LHC (10/24) with PCI/ stent to SVG-OM 2. Pulm edema - resolved.  3. ESRD - usual MWF HD. Plan HD today and tomorrow to get back on schedule.  4. HTN urgency - resolved 5. 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 Check phos. Not done as ordered. Confirm binder doses. 7. IDDM - insulin pump. Primary managing DM   Plan - as above  Kelly Splinter MD [facility] pgr [phone]   07/08/2017, 11:19 AM       Recent Labs Lab 07/05/17 0117 07/05/17 0132 07/05/17 0630 07/06/17 0250 07/07/17 0350 07/08/17 0429  NA 134* 137  --  133* 135 129*  K 5.5* 5.5*  --  5.0 4.8 6.7*  CL 100* 100*  --  93* 96* 93*  CO2 26  --   --  28 29 27   GLUCOSE 161* 162*  --  154* 56* 197*  BUN 38* 37*  --  24* 19 33*  CREATININE 6.76* 7.00* 7.01* 4.66* 4.08* 6.13*  CALCIUM 8.4*  --   --  8.3* 8.7* 8.2*  PHOS  --   --   --   --  3.9  --      Recent Labs Lab 07/05/17 0117 07/07/17 0350  AST 61*  --   ALT 51  --   ALKPHOS 118  --   BILITOT 0.8  --   PROT 7.0  --   ALBUMIN 3.5 3.4*    Recent Labs Lab 07/05/17 0117 07/05/17 0132 07/05/17 0630 07/06/17 0250 07/08/17 0429  WBC 6.6  --  6.9 5.8 5.7  NEUTROABS 4.1  --   --   --   --   HGB 9.5* 9.9* 9.4* 9.8* 9.5*  HCT 29.0* 29.0* 27.7* 30.0* 29.5*  MCV 92.7  --  92.3 91.7 91.3  PLT 140*  --  159 115* 118*  Recent Labs Lab 07/05/17 1822 07/05/17 2008 07/06/17 0906 07/06/17 1419 07/06/17 2115  TROPONINI 1.93* 1.92* 1.75* 1.48* 1.13*     Recent Labs Lab 07/08/17 0201 07/08/17 0214 07/08/17 0231 07/08/17 0405 07/08/17 1057  GLUCAP 46* 68 204* 187* 101*   Studies/Results: Dg Chest 2 View  Result Date: 07/06/2017 CLINICAL DATA:  Acute onset of central chest tightness. Initial encounter. EXAM: CHEST  2 VIEW COMPARISON:  Chest radiograph performed 07/05/2017 FINDINGS: The lungs are well-aerated. Minimal left basilar atelectasis is noted. There is no evidence of pleural effusion or pneumothorax. The heart is borderline enlarged. The patient is status post median sternotomy, with evidence of prior CABG. No acute osseous abnormalities are seen. IMPRESSION: Minimal left basilar atelectasis noted. Lungs otherwise clear. Borderline cardiomegaly. Electronically Signed   By: Garald Balding M.D.   On: 07/06/2017 21:31   Medications: . sodium chloride     . aspirin  81 mg Oral Daily  . atorvastatin  80 mg Oral q1800  . citalopram  20 mg Oral Daily  . clopidogrel  75 mg Oral Daily  . darbepoetin (ARANESP) injection - DIALYSIS  100 mcg Intravenous Q Mon-HD  . doxercalciferol  2 mcg Intravenous Q M,W,F-HD  . ezetimibe  10 mg Oral Daily  . gabapentin  300 mg Oral QHS  . heparin  5,000 Units Subcutaneous Q8H  . hydrALAZINE  25 mg Oral BID  . insulin aspart  0-9 Units Subcutaneous Q4H  . insulin pump   Subcutaneous Q4H  . isosorbide mononitrate  30 mg Oral  Daily  . metoprolol succinate  25 mg Oral Daily  . pantoprazole  40 mg Oral Daily  . sodium chloride flush  3 mL Intravenous Q12H   . sodium chloride

## 2017-07-08 NOTE — Discharge Summary (Signed)
Physician Discharge Summary  Kenneth [name]:[id number] DOB: 1956/06/13 DOA: 07/05/2017  PCP: [name], MD  Admit date: 07/05/2017 Discharge date: 07/08/2017  Admitted From: Home Disposition:  Home with Cardiac Rehab  Recommendations for Outpatient Follow-up:  1. Follow up with PCP as necessary 2. Follow up with CHF clinic in 2 weeks 3. Routine dialysis tomorrow  Home Health: no Equipment/Devices: No  Discharge Condition: Improved  CODE STATUS: Full Diet recommendation: Renal, Cardiac  Brief/Interim Summary: The patient is a 61 yo M with ESRD on HD MWF, admitted from home on 10/22 due to SOB and CP. He has ESRD. Was found to have pulmonary edema and a NSTEMI and admission requested. Had hypertensive urgency on admission as well and was placed on a nitro drip.  He underwent urgent HD that improved his dyspnea and pulmonary edema, but he continued to have chest discomfort.  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  He underwent DES to the SVG-OM1 graft and afterwards felt his chest pain was resolved.  He was referred to Cardiac Rehab and able to ambulate in the hall without difficulty.  He underwent an additional day of dialysis and was stable for discharge.   Discharge Diagnoses:  Principal Problem:   Acute pulmonary edema (HCC) Active Problems:   S/P CABG x 3   ESRD (end stage renal disease) (HCC)   CAD (coronary artery disease)   Anemia, chronic renal failure   Hypertensive emergency   Non-ST elevated myocardial infarction [facility])    Discharge Instructions  Discharge Instructions    AMB Referral to Cardiac Rehabilitation - Phase II    Complete by:  As directed    Diagnosis:  Coronary Stents   Amb Referral to Cardiac Rehabilitation    Complete by:  As directed    Diagnosis:  Coronary Stents   Diet - low sodium heart healthy    Complete by:  As directed    Discharge instructions    Complete  by:  As directed    From Dr. Loleta Books: Follow up with Dr. Aundra Dubin on Nov 6 at 9:20AM at the [facility] your aspirin and Plavix   Increase activity slowly    Complete by:  As directed      Allergies as of 07/08/2017   No Known Allergies     Medication List    TAKE these medications   aspirin 81 MG chewable tablet Chew 1 tablet (81 mg total) by mouth daily. Notes to patient:  Prevents clotting in stent and heart attack   atorvastatin 80 MG tablet Commonly known as:  LIPITOR TAKE ONE TABLET BY MOUTH ONCE DAILY What changed:  See the new instructions. Notes to patient:  Cholesterol    citalopram 20 MG tablet Commonly known as:  CELEXA Take 20 mg by mouth daily. Notes to patient:  Depression    clopidogrel 75 MG tablet Commonly known as:  PLAVIX Take 1 tablet (75 mg total) by mouth daily. Notes to patient:  Prevents clotting in stent and heart attack   ezetimibe 10 MG tablet Commonly known as:  ZETIA Take 1 tablet (10 mg total) by mouth daily. Notes to patient:  Cholesterol    gabapentin 300 MG capsule Commonly known as:  NEURONTIN Take 300 mg by mouth at bedtime. Notes to patient:  Nerve pain   hydrALAZINE 25 MG tablet Commonly known as:  APRESOLINE Take 1 tablet (25 mg total)  by mouth 2 (two) times daily. What changed:  how much to take Notes to patient:  Lowers blood pressure   insulin pump Soln Inject into the skin continuous. NOVOLOG Notes to patient:  Controls blood sugar   isosorbide mononitrate 30 MG 24 hr tablet Commonly known as:  IMDUR Take 1 tablet (30 mg total) by mouth daily. Notes to patient:  Lowers blood pressure Increases blood flow to heart    losartan 50 MG tablet Commonly known as:  COZAAR Take 1 tablet (50 mg total) by mouth daily. Notes to patient:  Lowers blood pressure   metoprolol succinate 25 MG 24 hr tablet Commonly known as:  TOPROL-XL Take 1 tablet (25 mg total) by mouth at bedtime. Notes to patient:  Decreases  work of the heart Decreases heart rate and blood pressure   pantoprazole 40 MG tablet Commonly known as:  PROTONIX Take 40 mg by mouth daily. Notes to patient:  Acid reflux/heartburn       Follow-up Information    Larey Dresser, MD. Go on 07/20/2017.   Specialty:  Cardiology Why:  9:20 AM, [facility], parking code [place] information: [address]. [place] [address] [phone]          No Known Allergies  Consultations:  Cardiology, Nephrology   Procedures/Studies: Dg Chest 2 View  Result Date: 07/06/2017 CLINICAL DATA:  Acute onset of central chest tightness. Initial encounter. EXAM: CHEST  2 VIEW COMPARISON:  Chest radiograph performed 07/05/2017 FINDINGS: The lungs are well-aerated. Minimal left basilar atelectasis is noted. There is no evidence of pleural effusion or pneumothorax. The heart is borderline enlarged. The patient is status post median sternotomy, with evidence of prior CABG. No acute osseous abnormalities are seen. IMPRESSION: Minimal left basilar atelectasis noted. Lungs otherwise clear. Borderline cardiomegaly. Electronically Signed   By: Garald Balding M.D.   On: 07/06/2017 21:31   Ct Head Wo Contrast  Result Date: 07/05/2017 CLINICAL DATA:  Acute onset of altered mental status. Shortness of breath. Initial encounter. EXAM: CT HEAD WITHOUT CONTRAST TECHNIQUE: Contiguous axial images were obtained from the base of the skull through the vertex without intravenous contrast. COMPARISON:  CT of the head performed 04/13/2017 FINDINGS: Brain: No evidence of acute infarction, hemorrhage, hydrocephalus, extra-axial collection or mass lesion/mass effect. The posterior fossa, including the cerebellum, brainstem and fourth ventricle, is within normal limits. The third and lateral ventricles, and basal ganglia are unremarkable in appearance. The cerebral hemispheres are symmetric in appearance, with normal gray-white  differentiation. No mass effect or midline shift is seen. Vascular: No hyperdense vessel or unexpected calcification. Skull: There is no evidence of fracture; visualized osseous structures are unremarkable in appearance. Sinuses/Orbits: The visualized portions of the orbits are within normal limits. The paranasal sinuses and mastoid air cells are well-aerated. Other: No significant soft tissue abnormalities are seen. IMPRESSION: Unremarkable noncontrast CT of the head. Electronically Signed   By: Garald Balding M.D.   On: 07/05/2017 06:37   Dg Chest Portable 1 View  Result Date: 07/05/2017 CLINICAL DATA:  Acute onset of generalized chest pain and shortness of breath. Initial encounter. EXAM: PORTABLE CHEST 1 VIEW COMPARISON:  Chest radiograph performed 04/13/2017 FINDINGS: The lungs are well-aerated. Vascular congestion is noted. Bibasilar airspace opacities raise concern for mild interstitial edema. There is no evidence of pleural effusion or pneumothorax. The cardiomediastinal silhouette is mildly enlarged. The patient is status post median sternotomy, with evidence of prior CABG. No acute osseous abnormalities  are seen. IMPRESSION: Vascular congestion and mild cardiomegaly. Bibasilar airspace opacities may reflect mild interstitial edema Electronically Signed   By: Garald Balding M.D.   On: 07/05/2017 01:36   Echocardiogram 07/06/2017 Study Conclusions  - Left ventricle: The cavity size was normal. There was mild   concentric hypertrophy. Systolic function was normal. The   estimated ejection fraction was 55%. There is akinesis of the   basal-midinferolateral, inferior, and inferoseptal myocardium.   There is akinesis of the basalanteroseptal myocardium. - Aortic valve: Moderate focal calcification involving the left   coronary cusp. - Mitral valve: There was mild regurgitation. - Pulmonic valve: There was trivial regurgitation. - Pulmonary arteries: PA peak pressure: 32 mm Hg (S).   Left  heart cath 07/07/2017 Elevated LVEDP 26 mmHg 2. 95% stenosis ostial SVG-OM1.  Suspect this is the culprit lesion for NSTEMI.  Discussed with Dr. Fletcher Anon, he will intervene Successful angioplasty and drug-eluting stent placement to the ostial SVG to OM.    Subjective: Feels better.  All questions answered through interpreter to patient's and wife's satisfaction.  Chest pain resolved.       Discharge Exam: Vitals:   07/08/17 1109 07/08/17 1246  BP: (!) 106/48 (!) 163/49  Pulse: (!) 56 (!) 57  Resp: 14   Temp: 97.8 F (36.6 C) (!) 97.5 F (36.4 C)  SpO2:  94%   Vitals:   07/08/17 1030 07/08/17 1100 07/08/17 1109 07/08/17 1246  BP: (!) 91/52 (!) 98/55 (!) 106/48 (!) 163/49  Pulse: (!) 54 (!) 56 (!) 56 (!) 57  Resp: 14 14 14    Temp:   97.8 F (36.6 C) (!) 97.5 F (36.4 C)  TempSrc:   Oral Oral  SpO2:    94%  Weight:   65 kg (143 lb 4.8 oz)   Height:        General: Pt is alert, awake, not in acute distress Cardiovascular: RRR, S1/S2 +, no rubs, no gallops Respiratory: CTA bilaterally, no wheezing, no rhonchi Abdominal: Soft, NT, ND, bowel sounds + Extremities: no edema, no cyanosis    The results of significant diagnostics from this hospitalization (including imaging, microbiology, ancillary and laboratory) are listed below for reference.     Microbiology: Recent Results (from the past 240 hour(s))  Blood culture (routine x 2)     Status: None (Preliminary result)   Collection Time: 07/05/17  1:18 AM  Result Value Ref Range Status   Specimen Description BLOOD RIGHT ANTECUBITAL  Final   Special Requests   Final    BOTTLES DRAWN AEROBIC AND ANAEROBIC Blood Culture adequate volume   Culture NO GROWTH 3 DAYS  Final   Report Status PENDING  Incomplete  Blood culture (routine x 2)     Status: None (Preliminary result)   Collection Time: 07/05/17  2:16 AM  Result Value Ref Range Status   Specimen Description BLOOD RIGHT ARM  Final   Special Requests IN PEDIATRIC  BOTTLE Blood Culture adequate volume  Final   Culture NO GROWTH 3 DAYS  Final   Report Status PENDING  Incomplete  MRSA PCR Screening     Status: None   Collection Time: 07/05/17  3:09 PM  Result Value Ref Range Status   MRSA by PCR NEGATIVE NEGATIVE Final    Comment:        The GeneXpert MRSA Assay (FDA approved for NASAL specimens only), is one component of a comprehensive MRSA colonization surveillance program. It is not intended to diagnose MRSA infection nor to  guide or monitor treatment for MRSA infections.      Labs: BNP (last 3 results)  Recent Labs  10/30/16 2024 12/15/16 2148 04/11/17 1529  BNP 1,900.0* 881.1* 5,093.2*   Basic Metabolic Panel:  Recent Labs Lab 07/05/17 0117 07/05/17 0132 07/05/17 0630 07/06/17 0250 07/07/17 0350 07/08/17 0429  NA 134* 137  --  133* 135 129*  K 5.5* 5.5*  --  5.0 4.8 6.7*  CL 100* 100*  --  93* 96* 93*  CO2 26  --   --  28 29 27   GLUCOSE 161* 162*  --  154* 56* 197*  BUN 38* 37*  --  24* 19 33*  CREATININE 6.76* 7.00* 7.01* 4.66* 4.08* 6.13*  CALCIUM 8.4*  --   --  8.3* 8.7* 8.2*  PHOS  --   --   --   --  3.9  --    Liver Function Tests:  Recent Labs Lab 07/05/17 0117 07/07/17 0350  AST 61*  --   ALT 51  --   ALKPHOS 118  --   BILITOT 0.8  --   PROT 7.0  --   ALBUMIN 3.5 3.4*   No results for input(s): LIPASE, AMYLASE in the last 168 hours. No results for input(s): AMMONIA in the last 168 hours. CBC:  Recent Labs Lab 07/05/17 0117 07/05/17 0132 07/05/17 0630 07/06/17 0250 07/08/17 0429  WBC 6.6  --  6.9 5.8 5.7  NEUTROABS 4.1  --   --   --   --   HGB 9.5* 9.9* 9.4* 9.8* 9.5*  HCT 29.0* 29.0* 27.7* 30.0* 29.5*  MCV 92.7  --  92.3 91.7 91.3  PLT 140*  --  159 115* 118*   Cardiac Enzymes:  Recent Labs Lab 07/05/17 1822 07/05/17 2008 07/06/17 0906 07/06/17 1419 07/06/17 2115  TROPONINI 1.93* 1.92* 1.75* 1.48* 1.13*   BNP: Invalid input(s): POCBNP CBG:  Recent Labs Lab 07/08/17 0214  07/08/17 0231 07/08/17 0405 07/08/17 1057 07/08/17 1255  GLUCAP 68 204* 187* 101* 87   D-Dimer No results for input(s): DDIMER in the last 72 hours. Hgb A1c No results for input(s): HGBA1C in the last 72 hours. Lipid Profile No results for input(s): CHOL, HDL, LDLCALC, TRIG, CHOLHDL, LDLDIRECT in the last 72 hours. Thyroid function studies No results for input(s): TSH, T4TOTAL, T3FREE, THYROIDAB in the last 72 hours.  Invalid input(s): FREET3 Anemia work up No results for input(s): VITAMINB12, FOLATE, FERRITIN, TIBC, IRON, RETICCTPCT in the last 72 hours. Urinalysis    Component Value Date/Time   COLORURINE YELLOW 10/30/2016 2339   APPEARANCEUR CLEAR 10/30/2016 2339   LABSPEC 1.010 10/30/2016 2339   PHURINE 5.0 10/30/2016 2339   GLUCOSEU NEGATIVE 10/30/2016 2339   HGBUR NEGATIVE 10/30/2016 2339   BILIRUBINUR NEGATIVE 10/30/2016 2339   KETONESUR NEGATIVE 10/30/2016 2339   PROTEINUR 100 (A) 10/30/2016 2339   NITRITE NEGATIVE 10/30/2016 2339   LEUKOCYTESUR NEGATIVE 10/30/2016 2339   Sepsis Labs Invalid input(s): PROCALCITONIN,  WBC,  LACTICIDVEN Microbiology Recent Results (from the past 240 hour(s))  Blood culture (routine x 2)     Status: None (Preliminary result)   Collection Time: 07/05/17  1:18 AM  Result Value Ref Range Status   Specimen Description BLOOD RIGHT ANTECUBITAL  Final   Special Requests   Final    BOTTLES DRAWN AEROBIC AND ANAEROBIC Blood Culture adequate volume   Culture NO GROWTH 3 DAYS  Final   Report Status PENDING  Incomplete  Blood culture (routine x 2)  Status: None (Preliminary result)   Collection Time: 07/05/17  2:16 AM  Result Value Ref Range Status   Specimen Description BLOOD RIGHT ARM  Final   Special Requests IN PEDIATRIC BOTTLE Blood Culture adequate volume  Final   Culture NO GROWTH 3 DAYS  Final   Report Status PENDING  Incomplete  MRSA PCR Screening     Status: None   Collection Time: 07/05/17  3:09 PM  Result Value Ref Range  Status   MRSA by PCR NEGATIVE NEGATIVE Final    Comment:        The GeneXpert MRSA Assay (FDA approved for NASAL specimens only), is one component of a comprehensive MRSA colonization surveillance program. It is not intended to diagnose MRSA infection nor to guide or monitor treatment for MRSA infections.      Time coordinating discharge: Over 30 minutes  SIGNED:   Edwin Dada, MD  [facility] 07/08/2017, 4:39 PM   If 7PM-7AM, please contact night-coverage [email or web address] Password [email or web address]

## 2017-07-09 ENCOUNTER — Telehealth (HOSPITAL_COMMUNITY): Payer: Self-pay

## 2017-07-09 NOTE — Telephone Encounter (Signed)
Patient insurances are active and benefits verified. Patient insurances are BCBS, Medicare A/B, and Medicaid. BCBS - no co-payment, deductible $400/$393.05 has been met, out of pocket $800/$800 has been met, 30% co-insurance, and no pre-authorization. Passport/reference [phone]. Medicare A/B - no co-payment, no deductible, no out of pocket, no co-insurance and no pre-authorization. Passport/reference [phone]. Medicaid -  no co-payment, no deductible, no out of pocket, no co-insurance and no pre-authorization. Passport/reference [phone].  Patient will be contacted and scheduled after their follow up appointment with the cardiologist on 07/20/17, upon review by [facility] RN navigator.

## 2017-07-10 LAB — CULTURE, BLOOD (ROUTINE X 2)
Culture: NO GROWTH
Culture: NO GROWTH
SPECIAL REQUESTS: ADEQUATE
Special Requests: ADEQUATE

## 2017-07-12 LAB — POCT ACTIVATED CLOTTING TIME: Activated Clotting Time: 175 seconds

## 2017-07-20 ENCOUNTER — Ambulatory Visit (HOSPITAL_COMMUNITY)
Admit: 2017-07-20 | Discharge: 2017-07-20 | Disposition: A | Discharge status: Home / Self Care | Payer: Medicare Other | Attending: Cardiology | Admitting: Cardiology

## 2017-07-20 VITALS — BP 130/58 | HR 75 | Wt 145.0 lb

## 2017-07-20 DIAGNOSIS — E875 Hyperkalemia: Secondary | ICD-10-CM | POA: Insufficient documentation

## 2017-07-20 DIAGNOSIS — E785 Hyperlipidemia, unspecified: Secondary | ICD-10-CM | POA: Insufficient documentation

## 2017-07-20 DIAGNOSIS — Z9889 Other specified postprocedural states: Secondary | ICD-10-CM | POA: Insufficient documentation

## 2017-07-20 DIAGNOSIS — I2510 Atherosclerotic heart disease of native coronary artery without angina pectoris: Secondary | ICD-10-CM | POA: Diagnosis not present

## 2017-07-20 DIAGNOSIS — Z7982 Long term (current) use of aspirin: Secondary | ICD-10-CM | POA: Insufficient documentation

## 2017-07-20 DIAGNOSIS — I252 Old myocardial infarction: Secondary | ICD-10-CM | POA: Diagnosis not present

## 2017-07-20 DIAGNOSIS — N186 End stage renal disease: Secondary | ICD-10-CM

## 2017-07-20 DIAGNOSIS — I5032 Chronic diastolic (congestive) heart failure: Secondary | ICD-10-CM

## 2017-07-20 DIAGNOSIS — E1122 Type 2 diabetes mellitus with diabetic chronic kidney disease: Secondary | ICD-10-CM | POA: Diagnosis not present

## 2017-07-20 DIAGNOSIS — Z79899 Other long term (current) drug therapy: Secondary | ICD-10-CM | POA: Diagnosis not present

## 2017-07-20 DIAGNOSIS — I255 Ischemic cardiomyopathy: Secondary | ICD-10-CM | POA: Diagnosis not present

## 2017-07-20 DIAGNOSIS — I132 Hypertensive heart and chronic kidney disease with heart failure and with stage 5 chronic kidney disease, or end stage renal disease: Secondary | ICD-10-CM | POA: Diagnosis not present

## 2017-07-20 DIAGNOSIS — I480 Paroxysmal atrial fibrillation: Secondary | ICD-10-CM | POA: Diagnosis not present

## 2017-07-20 DIAGNOSIS — I5022 Chronic systolic (congestive) heart failure: Secondary | ICD-10-CM

## 2017-07-20 DIAGNOSIS — Z992 Dependence on renal dialysis: Secondary | ICD-10-CM | POA: Diagnosis not present

## 2017-07-20 DIAGNOSIS — Z951 Presence of aortocoronary bypass graft: Secondary | ICD-10-CM | POA: Diagnosis not present

## 2017-07-20 DIAGNOSIS — I214 Non-ST elevation (NSTEMI) myocardial infarction: Secondary | ICD-10-CM | POA: Diagnosis not present

## 2017-07-20 DIAGNOSIS — E118 Type 2 diabetes mellitus with unspecified complications: Secondary | ICD-10-CM

## 2017-07-20 DIAGNOSIS — N185 Chronic kidney disease, stage 5: Secondary | ICD-10-CM | POA: Insufficient documentation

## 2017-07-20 DIAGNOSIS — Z794 Long term (current) use of insulin: Secondary | ICD-10-CM | POA: Insufficient documentation

## 2017-07-20 LAB — BASIC METABOLIC PANEL
ANION GAP: 11 (ref 5–15)
BUN: 24 mg/dL — ABNORMAL HIGH (ref 6–20)
CALCIUM: 8.8 mg/dL — AB (ref 8.9–10.3)
CO2: 30 mmol/L (ref 22–32)
Chloride: 94 mmol/L — ABNORMAL LOW (ref 101–111)
Creatinine, Ser: 5.67 mg/dL — ABNORMAL HIGH (ref 0.61–1.24)
GFR, EST AFRICAN AMERICAN: 11 mL/min — AB (ref >60)
GFR, EST NON AFRICAN AMERICAN: 10 mL/min — AB (ref >60)
GLUCOSE: 198 mg/dL — AB (ref 65–99)
POTASSIUM: 5.2 mmol/L — AB (ref 3.5–5.1)
SODIUM: 135 mmol/L (ref 135–145)

## 2017-07-20 NOTE — Progress Notes (Signed)
PCP: Dr. Tasia Catchings [facility]) Cardiology: Dr. Aundra Dubin  61 y.o. 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  He was admitted in 9/17 with NSTEMI.  LHC showed 3VD and he had CABG x 3.  He had CKD stage IV when he was admitted and developed ESRD requiring HD while in the hospital.  He had transient atrial fibrillation post-CABG.  He developed cardiogenic shock requiring milrinone but was able to taper off.  Echo in 9/17 showed EF 35-40%.    Admitted 8/1 through 04/16/2017 with malignant hypertension due to medication noncompliance. Continues on HD.  HF M-W-F. Discharge weight 140 pounds.   Admitted 10/18 with chest pain and pulmonary edema. NSTEMI noted on admit. Echo showed preserved EF but + WMAs. Cath with 95% ostial SVG-OM1 stenosis with TIMI 2 flow in graft.  Patient had DES to SVG-OM1.   He presents today for post hospital follow up. Feeling much better s/p stent. He has occasional exertional chest pressure but "much better" than prior to hospitalization. He explains a pain in his left chest, but it is worse with palpation and positional at night, not exertional.  He denies SOB/PND/Orthopnea. Tolerating HD M-W-F. Denies bleeding. Taking all medications as directed.   PMH: 1. Type II diabetes 2. HTN 3. CKD stage IV: Was on HD briefly but now off.  4. CAD: NSTEMI 9/17 with CABG x 3 (LIMA-LAD, SVG-OM, SVG-RCA).   - NSTEMI 10/18 s/p DES to ostial SVG and OM 5. Chronic systolic CHF: Ischemic cardiomyopathy.   - Echo (9/17) with EF 35-40%, grade II diastolic dysfunction.  - Echo (1/18) with EF 60-65%, grade II diastolic dysfunction. - Echo (10/18) with EF 55%, Akinesis of the basal-midinferolateral, inferior, and inferoseptal myocardiam. Akinesis of the basal anteroseptal myocardium.  6. Atrial fibrillation: Paroxysmal.  Only noted post-op CABG.  7. ABIs normal in 12/17.   SH: Married, lives in [place],  1 son, nonsmoker, no ETOH, speaks only [place].   FH: No premature CAD.  Review of systems complete and found to be negative unless listed in HPI.    Current Outpatient Medications  Medication Sig Dispense Refill  . aspirin 81 MG chewable tablet Chew 1 tablet (81 mg total) by mouth daily. 30 tablet 1  . atorvastatin (LIPITOR) 80 MG tablet TAKE ONE TABLET BY MOUTH ONCE DAILY (Patient taking differently: TAKE 80 MG BY MOUTH ONCE DAILY) 30 tablet 3  . citalopram (CELEXA) 20 MG tablet Take 20 mg by mouth daily.  1  . clopidogrel (PLAVIX) 75 MG tablet Take 1 tablet (75 mg total) by mouth daily. 90 tablet 2  . ezetimibe (ZETIA) 10 MG tablet Take 1 tablet (10 mg total) by mouth daily. 30 tablet 3  . gabapentin (NEURONTIN) 300 MG capsule Take 300 mg by mouth at bedtime.  0  . hydrALAZINE (APRESOLINE) 25 MG tablet Take 1 tablet (25 mg total) by mouth 2 (two) times daily. 60 tablet 3  . Insulin Human (INSULIN PUMP) SOLN Inject into the skin continuous. NOVOLOG    . isosorbide mononitrate (IMDUR) 30 MG 24 hr tablet Take 1 tablet (30 mg total) by mouth daily. 30 tablet 0  . losartan (COZAAR) 50 MG tablet Take 1 tablet (50 mg total) by mouth daily. 30 tablet 0  . metoprolol succinate (TOPROL-XL) 25 MG 24 hr tablet Take 1 tablet (25 mg total) by mouth at bedtime. 30 tablet 0  . pantoprazole (PROTONIX)  40 MG tablet Take 40 mg by mouth daily.   0   No current facility-administered medications for this encounter.    Vitals:   07/20/17 0947  BP: (!) 130/58  Pulse: 75  SpO2: 98%  Weight: 145 lb (65.8 kg)   Wt Readings from Last 3 Encounters:  07/20/17 145 lb (65.8 kg)  07/08/17 143 lb 4.8 oz (65 kg)  05/13/17 147 lb 3.2 oz (66.8 kg)   General: Well appearing. No resp difficulty. HEENT: Normal Neck: Supple. JVP 8-9 cm. Carotids 2+ bilat; no bruits. No thyromegaly or nodule noted. Cor: PMI nondisplaced. RRR, No M/G/R noted Lungs: CTAB, normal effort. Abdomen: Soft, non-tender, non-distended, no  HSM. No bruits or masses. +BS  Extremities: No cyanosis, clubbing, or rash. LUE AV fistula.  Neuro: Alert & orientedx3, cranial nerves grossly intact. moves all 4 extremities w/o difficulty. Affect pleasant   Assessment/Plan: 1. CAD: s/p CABG.  Cath 06/2017 with 95% ostial SVG-OM1 stenosis with TIMI 2 flow in graft - s/p DES to SVG-OM1 07/07/17.  - No further s/s of ischemia.    - On ASA and atorvastatin.    - Will be on Plavix 75 mg daily for at least one year.   - Refer to cardiac rehab.  2. Hyperlipidemia:  - Continue statin. Continue Zetia + atorvastatin. Check lipids next appointment.  3. Chronic diastolic CHF:  Ischemic cardiomyopathy.  EF low in past but improved after CABG. Most recent echo 07/06/17 LVEF 55% with wall motion abnormalities.  - On dialysis for volume control.  - Continue toprol xl 25 mg at bed time.  - Continue losaran  50 mg daily.   4. PAD: ABIs normal in 12/17. 5. HTN: BP controlled, continue current regimen. 6. CKD: Stage V.  - Continue HD M/W/F.  7. Hyperkalemia - K 6.7 on day of discharge with no clear hemolysis. Recheck today. This has likely since been addressed by HD, but pt is on Losartan, so will check today.  8. L Carotid bruit - Will order carotid dopplers  Send to cardiac rehab. BMET today to check on K.    Shirley Friar, PA-C  07/20/2017   Patient seen with PA, agree with the above note.   NSTEMI in 10/18 with DES to SVG-OM.  He is doing well, no further ischemic symptoms.  Continue current statin and Zetia, will need lipids/LFTs at next appointment.   EF 55% on last echo, this is improved from prior to CABG.   He has a left bruit, will check carotid dopplers (noise may be referred from AV fistula).   He will need referral to cardiac rehab.   Followup in 3 months.   Loralie Champagne 07/21/2017

## 2017-07-20 NOTE — Patient Instructions (Signed)
Labs drawn today (if we do not call you, then your lab work was stable) '  Your physician has requested that you have a carotid duplex. This test is an ultrasound of the carotid arteries in your neck. It looks at blood flow through these arteries that supply the brain with blood. Allow one hour for this exam. There are no restrictions or special instructions.  You have been referred to Cardiac Rehab

## 2017-07-22 ENCOUNTER — Encounter (HOSPITAL_COMMUNITY): Payer: Self-pay | Admitting: Cardiology

## 2017-08-02 ENCOUNTER — Other Ambulatory Visit (HOSPITAL_COMMUNITY): Payer: Self-pay | Admitting: Cardiology

## 2017-08-02 DIAGNOSIS — I6523 Occlusion and stenosis of bilateral carotid arteries: Secondary | ICD-10-CM

## 2017-08-03 ENCOUNTER — Telehealth (HOSPITAL_COMMUNITY): Payer: Self-pay

## 2017-08-03 NOTE — Telephone Encounter (Signed)
Called interpreter to speak with patient in regards to Cardiac Rehab - Patient is interested in participating in the program although patient may run into transportation issues. Wife works and Dialysis center provides transportation for patient. Patient will work on transportation. We need clearance from patients kidney dr to proceed with scheduling. Called patients Kidney Dr at [facility]. They will fax over clearance paper. We will then proceed to schedule patient.

## 2017-08-06 ENCOUNTER — Telehealth (HOSPITAL_COMMUNITY): Payer: Self-pay | Admitting: *Deleted

## 2017-08-06 NOTE — Telephone Encounter (Signed)
-----   Message from Corliss Parish, MD sent at 08/06/2017  2:11 PM EST ----- Regarding: RE: clearance to participate in cardiac rehab on same days as dialysis He actually does pretty well with HD.  No restrictions.   I would keep SBP at least above 100.    ----- Message ----- From: Rowe Pavy, RN Sent: 08/03/2017   5:34 PM To: Corliss Parish, MD Subject: clearance to participate in cardiac rehab on#  Dr. Moshe Cipro,  [facility] patient, Kenneth [name] 1956-03-31 is referred to cardiac rehab for group exercise s/p 10/22 NSTEMI and 10/24 DES stent placed to SVG. Contacted patient with the assist of translator and spoke to patient wife.  [name] would like to participate in Cardiac rehab on Mondays, Wednesdays and Fridays.  It is my understanding patient has dialysis on the same days at 6 am to noon.  In the past, this can be difficult for some patients.  Do you feel this patient is appropriate and will be able to participate in group exercise on the same days as dialysis? ( pt wife would like to do the 2:45 pm class)   If you feel Mr. Cho can participate, any restrictions of activity or limitations of movement with his fistula placement?   Acceptable blood pressure at rest and on exertion?  Thanks for your input Maurice Small RN, BSN Cardiac and Pulmonary Rehab Nurse Navigator

## 2017-08-09 ENCOUNTER — Telehealth (HOSPITAL_COMMUNITY): Payer: Self-pay

## 2017-08-09 ENCOUNTER — Other Ambulatory Visit (HOSPITAL_COMMUNITY): Payer: Self-pay | Admitting: Cardiology

## 2017-08-09 NOTE — Telephone Encounter (Signed)
Received release from Dr for patient to participate in [place] interpreter. Left message for patient to schedule.

## 2017-08-10 ENCOUNTER — Telehealth (HOSPITAL_COMMUNITY): Payer: Self-pay

## 2017-08-10 NOTE — Telephone Encounter (Signed)
Patient returned phone call - Called interpreter. Spoke with patient's wife and wife stated they have transportation figured out. Scheduled orientation on 08/31/2017 at 7:30am. Patient will attend the 2:45pm exc class. Called and scheduled interpreter for 08/31/2017.

## 2017-08-24 ENCOUNTER — Inpatient Hospital Stay (HOSPITAL_COMMUNITY)
Admission: RE | Admit: 2017-08-24 | Payer: Medicare Other | Source: Ambulatory Visit | Attending: Cardiology | Admitting: Cardiology

## 2017-08-26 ENCOUNTER — Ambulatory Visit (HOSPITAL_COMMUNITY)
Admission: RE | Admit: 2017-08-26 | Discharge: 2017-08-26 | Disposition: A | Discharge status: Home / Self Care | Payer: Medicare Other | Source: Ambulatory Visit | Attending: Cardiology | Admitting: Cardiology

## 2017-08-26 ENCOUNTER — Encounter (HOSPITAL_COMMUNITY): Payer: Self-pay | Admitting: Cardiology

## 2017-08-26 VITALS — BP 146/70 | HR 72 | Wt 145.4 lb

## 2017-08-26 DIAGNOSIS — I2510 Atherosclerotic heart disease of native coronary artery without angina pectoris: Secondary | ICD-10-CM | POA: Diagnosis not present

## 2017-08-26 DIAGNOSIS — E1122 Type 2 diabetes mellitus with diabetic chronic kidney disease: Secondary | ICD-10-CM | POA: Diagnosis not present

## 2017-08-26 DIAGNOSIS — E785 Hyperlipidemia, unspecified: Secondary | ICD-10-CM | POA: Diagnosis not present

## 2017-08-26 DIAGNOSIS — I5032 Chronic diastolic (congestive) heart failure: Secondary | ICD-10-CM | POA: Diagnosis not present

## 2017-08-26 DIAGNOSIS — Z7982 Long term (current) use of aspirin: Secondary | ICD-10-CM | POA: Insufficient documentation

## 2017-08-26 DIAGNOSIS — Z79899 Other long term (current) drug therapy: Secondary | ICD-10-CM | POA: Insufficient documentation

## 2017-08-26 DIAGNOSIS — Z992 Dependence on renal dialysis: Secondary | ICD-10-CM | POA: Insufficient documentation

## 2017-08-26 DIAGNOSIS — I132 Hypertensive heart and chronic kidney disease with heart failure and with stage 5 chronic kidney disease, or end stage renal disease: Secondary | ICD-10-CM | POA: Insufficient documentation

## 2017-08-26 DIAGNOSIS — N186 End stage renal disease: Secondary | ICD-10-CM | POA: Diagnosis not present

## 2017-08-26 DIAGNOSIS — Z951 Presence of aortocoronary bypass graft: Secondary | ICD-10-CM | POA: Diagnosis not present

## 2017-08-26 DIAGNOSIS — I5022 Chronic systolic (congestive) heart failure: Secondary | ICD-10-CM

## 2017-08-26 LAB — LIPID PANEL
CHOLESTEROL: 115 mg/dL (ref 0–200)
HDL: 31 mg/dL — ABNORMAL LOW (ref >40)
LDL CALC: 64 mg/dL (ref 0–99)
TRIGLYCERIDES: 99 mg/dL (ref <150)
Total CHOL/HDL Ratio: 3.7 RATIO
VLDL: 20 mg/dL (ref 0–40)

## 2017-08-26 NOTE — Patient Instructions (Signed)
Routine lab work today. Will notify you of abnormal results  Follow up with Dr.McLean in 6 months.

## 2017-08-26 NOTE — Progress Notes (Signed)
PCP: Dr. Tasia Catchings [facility]) Cardiology: Dr. Aundra Dubin  61 y.o. 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  He was admitted in 9/17 with NSTEMI.  LHC showed 3VD and he had CABG x 3.  He had CKD stage IV when he was admitted and developed ESRD requiring HD while in the hospital.  He had transient atrial fibrillation post-CABG.  He developed cardiogenic shock requiring milrinone but was able to taper off.  Echo in 9/17 showed EF 35-40%.    Admitted 8/1 through 04/16/2017 with malignant hypertension due to medication noncompliance. Continues on HD.  HF M-W-F. Discharge weight 140 pounds.   Admitted 10/18 with chest pain and pulmonary edema. NSTEMI noted on admit. Echo showed preserved EF but + WMAs. Cath with 95% ostial SVG-OM1 stenosis with TIMI 2 flow in graft.  Patient had DES to SVG-OM1.   He presents today for followup of CHF and CAD.  He is feeling good.  No problems with HD.  He has a "pinprick" sensation left shoulder on occasion that will last for seconds, no chest pain.  Short of breath only with fast walking.  No orthopnea/PND.  He is going to start cardiac rehab 12/18.   PMH: 1. Type II diabetes 2. HTN 3. ESRD 4. CAD: NSTEMI 9/17 with CABG x 3 (LIMA-LAD, SVG-OM, SVG-RCA).   - NSTEMI 10/18 s/p DES to ostial SVG and OM 5. Chronic systolic CHF: Ischemic cardiomyopathy.   - Echo (9/17) with EF 35-40%, grade II diastolic dysfunction.  - Echo (1/18) with EF 60-65%, grade II diastolic dysfunction. - Echo (10/18) with EF 55%, Akinesis of the basal-midinferolateral, inferior, and inferoseptal myocardiam. Akinesis of the basal anteroseptal myocardium.  6. Atrial fibrillation: Paroxysmal.  Only noted post-op CABG.  7. ABIs normal in 12/17.   SH: Married, lives in [place], 1 son, nonsmoker, no ETOH, speaks only [place].   FH: No premature CAD.  Review of systems complete and found to be negative  unless listed in HPI.    Current Outpatient Medications  Medication Sig Dispense Refill  . aspirin 81 MG chewable tablet Chew 1 tablet (81 mg total) by mouth daily. 30 tablet 1  . atorvastatin (LIPITOR) 80 MG tablet TAKE 1 TABLET BY MOUTH ONCE DAILY 30 tablet 11  . citalopram (CELEXA) 20 MG tablet Take 20 mg by mouth daily.  1  . clopidogrel (PLAVIX) 75 MG tablet Take 1 tablet (75 mg total) by mouth daily. 90 tablet 2  . ezetimibe (ZETIA) 10 MG tablet Take 1 tablet (10 mg total) by mouth daily. 30 tablet 3  . gabapentin (NEURONTIN) 300 MG capsule Take 300 mg by mouth at bedtime.  0  . hydrALAZINE (APRESOLINE) 25 MG tablet Take 1 tablet (25 mg total) by mouth 2 (two) times daily. 60 tablet 3  . Insulin Human (INSULIN PUMP) SOLN Inject into the skin continuous. NOVOLOG    . isosorbide mononitrate (IMDUR) 30 MG 24 hr tablet Take 1 tablet (30 mg total) by mouth daily. 30 tablet 0  . losartan (COZAAR) 50 MG tablet Take 1 tablet (50 mg total) by mouth daily. 30 tablet 0  . metoprolol succinate (TOPROL-XL) 25 MG 24 hr tablet Take 1 tablet (25 mg total) by mouth at bedtime. 30 tablet 0  . pantoprazole (PROTONIX) 40 MG tablet Take 40 mg by mouth daily.   0   No current facility-administered medications for this encounter.  Vitals:   08/26/17 0913  BP: (!) 146/70  Pulse: 72  SpO2: 96%  Weight: 145 lb 6.4 oz (66 kg)   Wt Readings from Last 3 Encounters:  08/26/17 145 lb 6.4 oz (66 kg)  07/20/17 145 lb (65.8 kg)  07/08/17 143 lb 4.8 oz (65 kg)   General: NAD Neck: No JVD, no thyromegaly or thyroid nodule.  Lungs: Clear to auscultation bilaterally with normal respiratory effort. CV: Nondisplaced PMI.  Heart regular S1/S2, no S3/S4, no murmur.  No peripheral edema.  Left carotid bruit (versus radiation from AV fistula in left arm).  Normal pedal pulses.  Abdomen: Soft, nontender, no hepatosplenomegaly, no distention.  Skin: Intact without lesions or rashes.  Neurologic: Alert and oriented x  3.  Psych: Normal affect. Extremities: No clubbing or cyanosis.  HEENT: Normal.   Assessment/Plan: 1. CAD: s/p CABG.  NSTEMI 10/18, cath with 95% ostial SVG-OM1 stenosis with TIMI 2 flow in graft, s/p DES to SVG-OM1 07/07/17. No chest pain.  - On ASA and atorvastatin.    - Will be on Plavix 75 mg daily for at least one year.   - To start cardiac rehab this months.  2. Hyperlipidemia: Continue Zetia + atorvastatin. Check lipids today.  3. Chronic diastolic CHF:  Ischemic cardiomyopathy.  EF low in past but improved after CABG. Most recent echo 07/06/17 LVEF 55% with wall motion abnormalities.  - On dialysis for volume control.  - Continue Toprol XL 25 mg at bed time.  - Continue losaran  50 mg daily.   4. PAD: ABIs normal in 12/17. 5. HTN: BP controlled, continue current regimen. 6. CKD: ESRD.  7. L Carotid bruit: Carotid dopplers are scheduled.   Loralie Champagne 08/26/2017

## 2017-08-27 ENCOUNTER — Telehealth (HOSPITAL_COMMUNITY): Payer: Self-pay | Admitting: Cardiac Rehabilitation

## 2017-08-27 NOTE — Telephone Encounter (Signed)
pc received from pt family who confirmed orientation appointment 08/31/2017.  Andi Hence, RN, BSN Cardiac Pulmonary Rehab

## 2017-08-27 NOTE — Telephone Encounter (Signed)
pc to confirm cardiac rehab orientation appointment and assess readiness for group exercise.  No answer.  Left message on voicemail via [facility] interpreter service.  Ivin Booty ID# [phone].  Andi Hence, RN, BSN Cardiac Pulmonary Rehab 08/27/17  3:14 PM

## 2017-08-31 ENCOUNTER — Encounter (HOSPITAL_COMMUNITY): Payer: Self-pay

## 2017-08-31 ENCOUNTER — Encounter (HOSPITAL_COMMUNITY)
Admission: RE | Admit: 2017-08-31 | Discharge: 2017-08-31 | Disposition: A | Discharge status: Home / Self Care | Payer: Medicare Other | Source: Ambulatory Visit | Attending: Cardiology | Admitting: Cardiology

## 2017-08-31 VITALS — Ht 66.0 in | Wt 150.6 lb

## 2017-08-31 DIAGNOSIS — Z79899 Other long term (current) drug therapy: Secondary | ICD-10-CM | POA: Diagnosis not present

## 2017-08-31 DIAGNOSIS — E1151 Type 2 diabetes mellitus with diabetic peripheral angiopathy without gangrene: Secondary | ICD-10-CM | POA: Diagnosis not present

## 2017-08-31 DIAGNOSIS — Z8701 Personal history of pneumonia (recurrent): Secondary | ICD-10-CM | POA: Insufficient documentation

## 2017-08-31 DIAGNOSIS — I252 Old myocardial infarction: Secondary | ICD-10-CM | POA: Insufficient documentation

## 2017-08-31 DIAGNOSIS — Z7982 Long term (current) use of aspirin: Secondary | ICD-10-CM | POA: Diagnosis not present

## 2017-08-31 DIAGNOSIS — Z87891 Personal history of nicotine dependence: Secondary | ICD-10-CM | POA: Diagnosis not present

## 2017-08-31 DIAGNOSIS — I214 Non-ST elevation (NSTEMI) myocardial infarction: Secondary | ICD-10-CM

## 2017-08-31 DIAGNOSIS — N186 End stage renal disease: Secondary | ICD-10-CM | POA: Diagnosis not present

## 2017-08-31 DIAGNOSIS — Z992 Dependence on renal dialysis: Secondary | ICD-10-CM | POA: Diagnosis not present

## 2017-08-31 DIAGNOSIS — Z7902 Long term (current) use of antithrombotics/antiplatelets: Secondary | ICD-10-CM | POA: Insufficient documentation

## 2017-08-31 DIAGNOSIS — Z951 Presence of aortocoronary bypass graft: Secondary | ICD-10-CM

## 2017-08-31 DIAGNOSIS — E1122 Type 2 diabetes mellitus with diabetic chronic kidney disease: Secondary | ICD-10-CM | POA: Insufficient documentation

## 2017-08-31 DIAGNOSIS — I509 Heart failure, unspecified: Secondary | ICD-10-CM | POA: Insufficient documentation

## 2017-08-31 DIAGNOSIS — Z9641 Presence of insulin pump (external) (internal): Secondary | ICD-10-CM | POA: Insufficient documentation

## 2017-08-31 LAB — GLUCOSE, CAPILLARY: Glucose-Capillary: 182 mg/dL — ABNORMAL HIGH (ref 65–99)

## 2017-08-31 NOTE — Progress Notes (Signed)
Kenneth [name] 61 y.o. male DOB: June 13, 1956 MRN: [id number]      Nutrition Note  1. NSTEMI (non-ST elevated myocardial infarction) ([place])   2. S/P CABG x 3    Past Medical History:  Diagnosis Date  . Acute pulmonary edema ([place]) 05/31/2016  . Acute respiratory failure with hypoxemia ([place]) 05/31/2016  . CHF (congestive heart failure) ([place])   . 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 ESRD (end stage renal disease) on dialysis [facility])    "MWF; [facility]" (01/18/2017)  . HCAP (healthcare-associated pneumonia)   . Ischemic rest pain of lower extremity ([place]) 02/06/2015  . 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 NSTEMI (non-ST elevated myocardial infarction) ([place])   . Pain in lower limb 02/06/2015  . Pain in the chest   . Pleural effusion   . 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 Shortness of breath    Meds reviewed. Novolog noted  HT: Ht Readings from Last 1 Encounters:  08/31/17 5\' 6"  (1.676 m)    WT: Weight /BMI 08/31/2017 08/26/2017 07/20/2017 07/08/2017  WEIGHT 150 lb 9.2 oz 145 lb 6.4 oz 145 lb 143 lb 4.8 oz   Weight /BMI 07/07/2017 05/13/2017 04/16/2017 04/14/2017 04/13/2017  WEIGHT  147 lb 3.2 oz 140 lb 6.9 oz  139 lb 9.6 oz   Weight /BMI 04/12/2017 02/21/2017 01/20/2017 01/19/2017 12/24/2016  WEIGHT  130 lb 138 lb 4.8 oz  142 lb   Weight /BMI 12/17/2016 12/16/2016 11/04/2016 10/31/2016 10/19/2016  WEIGHT 134 lb 8 oz  131 lb 6.4 oz  140 lb 8 oz   Weight /BMI 09/22/2016 08/11/2016 08/04/2016 07/14/2016  WEIGHT 140 lb 8 oz 133 lb 1.9 oz 132 lb 135 lb   Weight /BMI 07/09/2016 06/23/2016 05/31/2016 02/06/2015  WEIGHT 135 lb 12.8 oz 131 lb 4.8 oz  128 lb      BMI 24.3   Current tobacco use? No  Labs:  Lipid Panel     Component Value Date/Time   CHOL 115 08/26/2017 0927   TRIG 99 08/26/2017 0927   HDL 31 (L) 08/26/2017 0927   CHOLHDL 3.7 08/26/2017 0927   VLDL 20 08/26/2017 0927   LDLCALC 64 08/26/2017 0927    Lab Results  Component Value Date   HGBA1C 5.8 (H)  04/12/2017   CBG (last 3)  Recent Labs    08/31/17 0826  GLUCAP 182*    Nutrition Note Spoke with pt and pt's wife. Nutrition plan and goals reviewed with pt. Pt states he eats a "[place]" diet. Pt c/o wt gain over the past 2 years. Per EMR, pt has gained 22 lb over the past 2.5 years. Pt wants to prevent further wt gain/maintain his current wt. Pt is diabetic. Last A1c indicates blood glucose well-controlled. Pt states his A1c was 12 "not too long ago." ? If wt gain due partially to improved glycemic control. Pt is on an insulin pump and counts CHO to help manage CBG's. Pt checks CBG's 6 times a day. Per discussion, pt c/o frequent episodes of hypoglycemia. Hypoglycemia prevention discussed. Pt encouraged to discuss hypoglycemic frequency with his endocrinologist, Dr. Francetta Found. Pt expressed understanding of the information reviewed. Pt aware of nutrition education classes offered.  Nutrition Diagnosis ? Food-and nutrition-related knowledge deficit related to lack of exposure to information as related to diagnosis of: ? CVD ? DM  Nutrition Intervention ? Pt's individual nutrition plan and goals reviewed with pt.  Nutrition Goal(s):  ? Pt to identify food quantities necessary to  achieve weight maintenance/prevent further wt gain at graduation from cardiac rehab.  Plan:  Pt to attend nutrition classes ? Nutrition I ? Nutrition II ? Portion Distortion ? Diabetes Blitz ? Diabetes Q & A Will provide client-centered nutrition education as part of interdisciplinary care.   Monitor and evaluate progress toward nutrition goal with team.  Derek Mound, M.Ed, RD, LDN, CDE 08/31/2017 3:29 PM

## 2017-08-31 NOTE — Progress Notes (Signed)
Cardiac Individual Treatment Plan  Patient Details  Name: Kenneth [name] MRN: [id number] Date of Birth: [birth year] Referring Provider:     CARDIAC REHAB PHASE II ORIENTATION from 08/31/2017 in [place]  Referring Provider  Loralie Champagne MD      Initial Encounter Date:    CARDIAC REHAB PHASE II ORIENTATION from 08/31/2017 in [place]  Date  08/31/17  Referring Provider  Loralie Champagne MD      Visit Diagnosis: NSTEMI (non-ST elevated myocardial infarction) ([place])  S/P CABG x 3  Patient's Home Medications on Admission:  Current Outpatient Medications:  .  aspirin 81 MG chewable tablet, Chew 1 tablet (81 mg total) by mouth daily., Disp: 30 tablet, Rfl: 1 .  atorvastatin (LIPITOR) 80 MG tablet, TAKE 1 TABLET BY MOUTH ONCE DAILY, Disp: 30 tablet, Rfl: 11 .  citalopram (CELEXA) 20 MG tablet, Take 20 mg by mouth daily., Disp: , Rfl: 1 .  clopidogrel (PLAVIX) 75 MG tablet, Take 1 tablet (75 mg total) by mouth daily., Disp: 90 tablet, Rfl: 2 .  ezetimibe (ZETIA) 10 MG tablet, Take 1 tablet (10 mg total) by mouth daily., Disp: 30 tablet, Rfl: 3 .  gabapentin (NEURONTIN) 300 MG capsule, Take 300 mg by mouth at bedtime., Disp: , Rfl: 0 .  hydrALAZINE (APRESOLINE) 25 MG tablet, Take 1 tablet (25 mg total) by mouth 2 (two) times daily., Disp: 60 tablet, Rfl: 3 .  Insulin Human (INSULIN PUMP) SOLN, Inject into the skin continuous. NOVOLOG, Disp: , Rfl:  .  isosorbide mononitrate (IMDUR) 30 MG 24 hr tablet, Take 1 tablet (30 mg total) by mouth daily., Disp: 30 tablet, Rfl: 0 .  losartan (COZAAR) 50 MG tablet, Take 1 tablet (50 mg total) by mouth daily., Disp: 30 tablet, Rfl: 0 .  metoprolol succinate (TOPROL-XL) 25 MG 24 hr tablet, Take 1 tablet (25 mg total) by mouth at bedtime., Disp: 30 tablet, Rfl: 0 .  pantoprazole (PROTONIX) 40 MG tablet, Take 40 mg by mouth daily. , Disp: , Rfl: 0  Past Medical History: Past Medical  History:  Diagnosis Date  . Acute pulmonary edema ([place]) 05/31/2016  . Acute respiratory failure with hypoxemia ([place]) 05/31/2016  . CHF (congestive heart failure) ([place])   . 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[place]) 02/06/2015  . ESRD (end stage renal disease) on dialysis [facility])    "MWF; [place]" (01/18/2017)  . HCAP (healthcare-associated pneumonia)   . Ischemic rest pain of lower extremity ([place]) 02/06/2015  . 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 NSTEMI (non-ST elevated myocardial infarction) ([place])   . Pain in lower limb 02/06/2015  . Pain in the chest   . Pleural effusion   . 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 Shortness of breath     Tobacco Use: Social History   Tobacco Use  Smoking Status Former Smoker  Smokeless Tobacco Never Used    Labs: Recent Chemical engineer    Labs for ITP Cardiac and Pulmonary Rehab Latest Ref Rng & Units 04/14/2017 07/05/2017 07/05/2017 07/05/2017 08/26/2017   Cholestrol 0 - 200 mg/dL - - - - 115   LDLCALC 0 - 99 mg/dL - - - - 64   HDL >40 mg/dL - - - - 31(L)   Trlycerides <150 mg/dL - - - - 99   Hemoglobin A1c 4.8 - 5.6 % - - - - -   PHART 7.350 - 7.450 - - 7.787(HH) 7.429  -  PCO2ART 32.0 - 48.0 mmHg - - <15.0(LL) 42.2  -   HCO3 20.0 - 28.0 mmol/L - - 28.0 28.0 -   TCO2 22 - 32 mmol/L 29 26 29 29  -   ACIDBASEDEF 0.0 - 2.0 mmol/L - - - - -   O2SAT % - - 97.0 97.0 -      Capillary Blood Glucose: Lab Results  Component Value Date   GLUCAP 182 (H) 08/31/2017   GLUCAP 87 07/08/2017   GLUCAP 101 (H) 07/08/2017   GLUCAP 187 (H) 07/08/2017   GLUCAP 204 (H) 07/08/2017     Exercise Target Goals: Date: 08/31/17  Exercise Program Goal: Individual exercise prescription set with THRR, safety & activity barriers. Participant demonstrates ability to understand and report RPE using BORG scale, to self-measure pulse accurately, and to acknowledge the importance of the exercise prescription.  Exercise Prescription  Goal: Starting with aerobic activity 30 plus minutes a day, 3 days per week for initial exercise prescription. Provide home exercise prescription and guidelines that participant acknowledges understanding prior to discharge.  Activity Barriers & Risk Stratification: Activity Barriers & Cardiac Risk Stratification - 08/31/17 0908      Activity Barriers & Cardiac Risk Stratification   Activity Barriers  Deconditioning;Muscular Weakness;Other (comment)    Comments  L foot pain, neuropathy    Cardiac Risk Stratification  High       6 Minute Walk: 6 Minute Walk    Row Name 08/31/17 1152         6 Minute Walk   Phase  Initial     Distance  1302 feet     Walk Time  6 minutes     # of Rest Breaks  0     MPH  2.46     METS  3.3     RPE  11     Perceived Dyspnea   1     VO2 Peak  11.4     Symptoms  Yes (comment)     Comments  SOB     Resting HR  65 bpm     Resting BP  114/64     Resting Oxygen Saturation   97 %     Exercise Oxygen Saturation  during 6 min walk  99 %     Max Ex. HR  70 bpm     Max Ex. BP  120/70     2 Minute Post BP  102/70        Oxygen Initial Assessment:   Oxygen Re-Evaluation:   Oxygen Discharge (Final Oxygen Re-Evaluation):   Initial Exercise Prescription: Initial Exercise Prescription - 08/31/17 1100      Date of Initial Exercise RX and Referring Provider   Date  08/31/17    Referring Provider  Loralie Champagne MD      Bike   Level  0.5    Minutes  10    METs  2.35      NuStep   Level  2    SPM  80    Minutes  10    METs  2      Arm Ergometer   Level  2    Minutes  10    METs  2      Prescription Details   Frequency (times per week)  3    Duration  Progress to 30 minutes of continuous aerobic without signs/symptoms of physical distress      Intensity   THRR 40-80% of Max Heartrate  64-128    Ratings of Perceived Exertion  11-13    Perceived Dyspnea  0-4      Progression   Progression  Continue to progress workloads to  maintain intensity without signs/symptoms of physical distress.      Resistance Training   Training Prescription  Yes    Weight  2lbs    Reps  10-15       Perform Capillary Blood Glucose checks as needed.  Exercise Prescription Changes:   Exercise Comments:   Exercise Goals and Review:  Exercise Goals    Row Name 08/31/17 0908             Exercise Goals   Increase Physical Activity  Yes       Intervention  Provide advice, education, support and counseling about physical activity/exercise needs.;Develop an individualized exercise prescription for aerobic and resistive training based on initial evaluation findings, risk stratification, comorbidities and participant's personal goals.       Expected Outcomes  Achievement of increased cardiorespiratory fitness and enhanced flexibility, muscular endurance and strength shown through measurements of functional capacity and personal statement of participant.       Increase Strength and Stamina  Yes       Intervention  Provide advice, education, support and counseling about physical activity/exercise needs.;Develop an individualized exercise prescription for aerobic and resistive training based on initial evaluation findings, risk stratification, comorbidities and participant's personal goals.       Expected Outcomes  Achievement of increased cardiorespiratory fitness and enhanced flexibility, muscular endurance and strength shown through measurements of functional capacity and personal statement of participant.       Able to understand and use rate of perceived exertion (RPE) scale  Yes       Intervention  Provide education and explanation on how to use RPE scale       Expected Outcomes  Short Term: Able to use RPE daily in rehab to express subjective intensity level;Long Term:  Able to use RPE to guide intensity level when exercising independently       Knowledge and understanding of Target Heart Rate Range (THRR)  Yes       Intervention   Provide education and explanation of THRR including how the numbers were predicted and where they are located for reference       Expected Outcomes  Short Term: Able to state/look up THRR;Short Term: Able to use daily as guideline for intensity in rehab;Long Term: Able to use THRR to govern intensity when exercising independently       Able to check pulse independently  Yes       Intervention  Provide education and demonstration on how to check pulse in carotid and radial arteries.;Review the importance of being able to check your own pulse for safety during independent exercise       Expected Outcomes  Short Term: Able to explain why pulse checking is important during independent exercise;Long Term: Able to check pulse independently and accurately       Understanding of Exercise Prescription  Yes       Intervention  Provide education, explanation, and written materials on patient's individual exercise prescription       Expected Outcomes  Short Term: Able to explain program exercise prescription;Long Term: Able to explain home exercise prescription to exercise independently          Exercise Goals Re-Evaluation :    Discharge Exercise Prescription (Final Exercise Prescription Changes):   Nutrition:  Target Goals:  Understanding of nutrition guidelines, daily intake of sodium 1500mg , cholesterol 200mg , calories 30% from fat and 7% or less from saturated fats, daily to have 5 or more servings of fruits and vegetables.  Biometrics: Pre Biometrics - 08/31/17 1157      Pre Biometrics   Height  5\' 6"  (1.676 m)    Weight  150 lb 9.2 oz (68.3 kg)    Waist Circumference  34 inches    Hip Circumference  36.5 inches    Waist to Hip Ratio  0.93 %    BMI (Calculated)  24.31    Triceps Skinfold  20 mm    % Body Fat  24.6 %    Grip Strength  32 kg    Flexibility  9 in    Single Leg Stand  6 seconds        Nutrition Therapy Plan and Nutrition Goals:   Nutrition Discharge: Nutrition  Scores:   Nutrition Goals Re-Evaluation:   Nutrition Goals Re-Evaluation:   Nutrition Goals Discharge (Final Nutrition Goals Re-Evaluation):   Psychosocial: Target Goals: Acknowledge presence or absence of significant depression and/or stress, maximize coping skills, provide positive support system. Participant is able to verbalize types and ability to use techniques and skills needed for reducing stress and depression.  Initial Review & Psychosocial Screening: Initial Psych Review & Screening - 08/31/17 0914      Family Dynamics   Good Support System?  Yes    Comments  no psychosocial needs identified, no interventions necessary       Barriers   Psychosocial barriers to participate in program  There are no identifiable barriers or psychosocial needs.      Screening Interventions   Interventions  Encouraged to exercise;Provide feedback about the scores to participant       Quality of Life Scores: Quality of Life - 08/31/17 0915      Quality of Life Scores   Health/Function Pre  11.2 %    Socioeconomic Pre  15 %    Psych/Spiritual Pre  14.5 %    Family Pre  25.2 %    GLOBAL Pre  14.74 %       PHQ-9: Recent Review Flowsheet Data    There is no flowsheet data to display.     Interpretation of Total Score  Total Score Depression Severity:  1-4 = Minimal depression, 5-9 = Mild depression, 10-14 = Moderate depression, 15-19 = Moderately severe depression, 20-27 = Severe depression   Psychosocial Evaluation and Intervention:   Psychosocial Re-Evaluation:   Psychosocial Discharge (Final Psychosocial Re-Evaluation):   Vocational Rehabilitation: Provide vocational rehab assistance to qualifying candidates.   Vocational Rehab Evaluation & Intervention: Vocational Rehab - 08/31/17 0913      Initial Vocational Rehab Evaluation & Intervention   Assessment shows need for Vocational Rehabilitation  No       Education: Education Goals: Education classes will be  provided on a weekly basis, covering required topics. Participant will state understanding/return demonstration of topics presented.  Learning Barriers/Preferences: Learning Barriers/Preferences - 08/31/17 1275      Learning Barriers/Preferences   Learning Barriers  Hearing;Cultural/Spiritual;Language    Learning Preferences  Written Material;Video;Pictoral       Education Topics: Count Your Pulse:  -Group instruction provided by verbal instruction, demonstration, patient participation and written materials to support subject.  Instructors address importance of being able to find your pulse and how to count your pulse when at home without a heart monitor.  Patients get hands on experience  counting their pulse with staff help and individually.   Heart Attack, Angina, and Risk Factor Modification:  -Group instruction provided by verbal instruction, video, and written materials to support subject.  Instructors address signs and symptoms of angina and heart attacks.    Also discuss risk factors for heart disease and how to make changes to improve heart health risk factors.   Functional Fitness:  -Group instruction provided by verbal instruction, demonstration, patient participation, and written materials to support subject.  Instructors address safety measures for doing things around the house.  Discuss how to get up and down off the floor, how to pick things up properly, how to safely get out of a chair without assistance, and balance training.   Meditation and Mindfulness:  -Group instruction provided by verbal instruction, patient participation, and written materials to support subject.  Instructor addresses importance of mindfulness and meditation practice to help reduce stress and improve awareness.  Instructor also leads participants through a meditation exercise.    Stretching for Flexibility and Mobility:  -Group instruction provided by verbal instruction, patient participation, and  written materials to support subject.  Instructors lead participants through series of stretches that are designed to increase flexibility thus improving mobility.  These stretches are additional exercise for major muscle groups that are typically performed during regular warm up and cool down.   Hands Only CPR:  -Group verbal, video, and participation provides a basic overview of AHA guidelines for community CPR. Role-play of emergencies allow participants the opportunity to practice calling for help and chest compression technique with discussion of AED use.   Hypertension: -Group verbal and written instruction that provides a basic overview of hypertension including the most recent diagnostic guidelines, risk factor reduction with self-care instructions and medication management.    Nutrition I class: Heart Healthy Eating:  -Group instruction provided by PowerPoint slides, verbal discussion, and written materials to support subject matter. The instructor gives an explanation and review of the Therapeutic Lifestyle Changes diet recommendations, which includes a discussion on lipid goals, dietary fat, sodium, fiber, plant stanol/sterol esters, sugar, and the components of a well-balanced, healthy diet.   Nutrition II class: Lifestyle Skills:  -Group instruction provided by PowerPoint slides, verbal discussion, and written materials to support subject matter. The instructor gives an explanation and review of label reading, grocery shopping for heart health, heart healthy recipe modifications, and ways to make healthier choices when eating out.   Diabetes Question & Answer:  -Group instruction provided by PowerPoint slides, verbal discussion, and written materials to support subject matter. The instructor gives an explanation and review of diabetes co-morbidities, pre- and post-prandial blood glucose goals, pre-exercise blood glucose goals, signs, symptoms, and treatment of hypoglycemia and  hyperglycemia, and foot care basics.   Diabetes Blitz:  -Group instruction provided by PowerPoint slides, verbal discussion, and written materials to support subject matter. The instructor gives an explanation and review of the physiology behind type 1 and type 2 diabetes, diabetes medications and rational behind using different medications, pre- and post-prandial blood glucose recommendations and Hemoglobin A1c goals, diabetes diet, and exercise including blood glucose guidelines for exercising safely.    Portion Distortion:  -Group instruction provided by PowerPoint slides, verbal discussion, written materials, and food models to support subject matter. The instructor gives an explanation of serving size versus portion size, changes in portions sizes over the last 20 years, and what consists of a serving from each food group.   Stress Management:  -Group instruction provided by verbal  instruction, video, and written materials to support subject matter.  Instructors review role of stress in heart disease and how to cope with stress positively.     Exercising on Your Own:  -Group instruction provided by verbal instruction, power point, and written materials to support subject.  Instructors discuss benefits of exercise, components of exercise, frequency and intensity of exercise, and end points for exercise.  Also discuss use of nitroglycerin and activating EMS.  Review options of places to exercise outside of rehab.  Review guidelines for sex with heart disease.   Cardiac Drugs I:  -Group instruction provided by verbal instruction and written materials to support subject.  Instructor reviews cardiac drug classes: antiplatelets, anticoagulants, beta blockers, and statins.  Instructor discusses reasons, side effects, and lifestyle considerations for each drug class.   Cardiac Drugs II:  -Group instruction provided by verbal instruction and written materials to support subject.  Instructor  reviews cardiac drug classes: angiotensin converting enzyme inhibitors (ACE-I), angiotensin II receptor blockers (ARBs), nitrates, and calcium channel blockers.  Instructor discusses reasons, side effects, and lifestyle considerations for each drug class.   Anatomy and Physiology of the Circulatory System:  Group verbal and written instruction and models provide basic cardiac anatomy and physiology, with the coronary electrical and arterial systems. Review of: AMI, Angina, Valve disease, Heart Failure, Peripheral Artery Disease, Cardiac Arrhythmia, Pacemakers, and the ICD.   Other Education:  -Group or individual verbal, written, or video instructions that support the educational goals of the cardiac rehab program.   Knowledge Questionnaire Score: Knowledge Questionnaire Score - 08/31/17 0913      Knowledge Questionnaire Score   Pre Score  16/24       Core Components/Risk Factors/Patient Goals at Admission: Personal Goals and Risk Factors at Admission - 08/31/17 1233      Core Components/Risk Factors/Patient Goals on Admission   Heart Failure  Yes    Intervention  Provide a combined exercise and nutrition program that is supplemented with education, support and counseling about heart failure. Directed toward relieving symptoms such as shortness of breath, decreased exercise tolerance, and extremity edema.    Expected Outcomes  Improve functional capacity of life;Short term: Attendance in program 2-3 days a week with increased exercise capacity. Reported lower sodium intake. Reported increased fruit and vegetable intake. Reports medication compliance.;Short term: Daily weights obtained and reported for increase. Utilizing diuretic protocols set by physician.;Long term: Adoption of self-care skills and reduction of barriers for early signs and symptoms recognition and intervention leading to self-care maintenance.       Core Components/Risk Factors/Patient Goals Review:    Core  Components/Risk Factors/Patient Goals at Discharge (Final Review):    ITP Comments: ITP Comments    Row Name 08/31/17 0906           ITP Comments  Medical Director, Dr. Fransico Him          Comments: Patient attended orientation from 0754to 1000  to review rules and guidelines for program. Completed 6 minute walk test, Intitial ITP, and exercise prescription.  VSS. Telemetry-sinus rhythm, nonspecific ST changes, TWI.  Marland Kitchen  Asymptomatic.  Andi Hence, RN, BSN, Cardiac Pulmonary Rehab 08/31/17  12:36 PM

## 2017-09-01 ENCOUNTER — Encounter (HOSPITAL_COMMUNITY): Payer: Self-pay

## 2017-09-02 ENCOUNTER — Ambulatory Visit (HOSPITAL_COMMUNITY)
Admission: RE | Admit: 2017-09-02 | Discharge: 2017-09-02 | Disposition: A | Discharge status: Home / Self Care | Payer: Medicare Other | Source: Ambulatory Visit | Attending: Cardiology | Admitting: Cardiology

## 2017-09-02 DIAGNOSIS — Z87891 Personal history of nicotine dependence: Secondary | ICD-10-CM | POA: Diagnosis not present

## 2017-09-02 DIAGNOSIS — E119 Type 2 diabetes mellitus without complications: Secondary | ICD-10-CM | POA: Insufficient documentation

## 2017-09-02 DIAGNOSIS — I10 Essential (primary) hypertension: Secondary | ICD-10-CM | POA: Insufficient documentation

## 2017-09-02 DIAGNOSIS — I6523 Occlusion and stenosis of bilateral carotid arteries: Secondary | ICD-10-CM | POA: Diagnosis present

## 2017-09-02 DIAGNOSIS — I2510 Atherosclerotic heart disease of native coronary artery without angina pectoris: Secondary | ICD-10-CM | POA: Insufficient documentation

## 2017-09-02 DIAGNOSIS — Z951 Presence of aortocoronary bypass graft: Secondary | ICD-10-CM | POA: Insufficient documentation

## 2017-09-08 ENCOUNTER — Encounter (HOSPITAL_COMMUNITY)
Admission: RE | Admit: 2017-09-08 | Discharge: 2017-09-08 | Disposition: A | Discharge status: Home / Self Care | Payer: Medicare Other | Source: Ambulatory Visit | Attending: Cardiology | Admitting: Cardiology

## 2017-09-08 VITALS — BP 110/50 | HR 71 | Ht 66.0 in | Wt 146.8 lb

## 2017-09-08 DIAGNOSIS — I252 Old myocardial infarction: Secondary | ICD-10-CM | POA: Diagnosis not present

## 2017-09-08 DIAGNOSIS — I214 Non-ST elevation (NSTEMI) myocardial infarction: Secondary | ICD-10-CM

## 2017-09-08 DIAGNOSIS — Z951 Presence of aortocoronary bypass graft: Secondary | ICD-10-CM

## 2017-09-08 LAB — GLUCOSE, CAPILLARY
Glucose-Capillary: 129 mg/dL — ABNORMAL HIGH (ref 65–99)
Glucose-Capillary: 98 mg/dL (ref 65–99)

## 2017-09-08 NOTE — Progress Notes (Signed)
Daily Session Note  Patient Details  Name: Kenneth [name] MRN: [id number] Date of Birth: 11-25-1955 Referring Provider:     CARDIAC REHAB PHASE II ORIENTATION from 08/31/2017 in [place]  Referring Provider  Loralie Champagne MD      Encounter Date: 09/08/2017  Check In: Session Check In - 09/08/17 1518      Check-In   Location  [facility]    Staff Present  Andi Hence, RN, Marga Melnick, RN, Deland Pretty, MS, ACSM CEP, Exercise Physiologist    Supervising physician immediately available to respond to emergencies  [facility] immediately available    Physician(s)  Dr. Rockne Menghini     Medication changes reported      No    Fall or balance concerns reported     No    Tobacco Cessation  No Change    Warm-up and Cool-down  Performed as group-led instruction    Resistance Training Performed  No    VAD Patient?  No      Pain Assessment   Currently in Pain?  No/denies       Capillary Blood Glucose: Results for orders placed or performed during the hospital encounter of 09/08/17 (from the past 24 hour(s))  Glucose, capillary     Status: Abnormal   Collection Time: 09/08/17  3:08 PM  Result Value Ref Range   Glucose-Capillary 129 (H) 65 - 99 mg/dL  Glucose, capillary     Status: None   Collection Time: 09/08/17  3:49 PM  Result Value Ref Range   Glucose-Capillary 98 65 - 99 mg/dL    Exercise Prescription Changes - 09/08/17 1503      Response to Exercise   Blood Pressure (Admit)  110/50    Blood Pressure (Exercise)  140/64    Blood Pressure (Exit)  100/62    Heart Rate (Admit)  71 bpm    Heart Rate (Exercise)  85 bpm    Heart Rate (Exit)  69 bpm    Rating of Perceived Exertion (Exercise)  15    Symptoms  none    Duration  Progress to 30 minutes of  aerobic without signs/symptoms of physical distress    Intensity  THRR unchanged      Progression   Progression  Continue to progress workloads to maintain intensity  without signs/symptoms of physical distress.    Average METs  2.3      Resistance Training   Training Prescription  Yes    Weight  2lbs    Reps  10-15    Time  10 Minutes      Interval Training   Interval Training  No      Bike   Level  0.5    Minutes  10    METs  2.42      NuStep   Level  2    SPM  80    Minutes  10    METs  2.2      Arm Ergometer   Level  2    Minutes  10       Social History   Tobacco Use  Smoking Status Former Smoker  Smokeless Tobacco Never Used    Goals Met:  Exercise tolerated well  Goals Unmet:  Not Applicable  Comments: Pt started cardiac rehab today.  Pt tolerated light exercise without difficulty. VSS, telemetry-Sinus Rhtyhm, asymptomatic.  Medication list reconciled. Pt denies barriers to medicaiton compliance.  PSYCHOSOCIAL ASSESSMENT:  PHQ-0.  Pt exhibits positive coping skills, hopeful outlook with supportive family. No psychosocial needs identified at this time, no psychosocial interventions necessary.    Pt enjoys watching TV and movies.   Pt oriented to exercise equipment and routine.    Understanding verbalized.Barnet Pall, RN,BSN 09/08/2017 4:43 PM   Dr. Fransico Him is Medical Director for Cardiac Rehab at [facility].

## 2017-09-10 ENCOUNTER — Telehealth (HOSPITAL_COMMUNITY): Payer: Self-pay | Admitting: *Deleted

## 2017-09-10 ENCOUNTER — Encounter (HOSPITAL_COMMUNITY): Payer: Self-pay | Admitting: *Deleted

## 2017-09-10 ENCOUNTER — Encounter (HOSPITAL_COMMUNITY): Payer: Medicare Other

## 2017-09-10 DIAGNOSIS — I214 Non-ST elevation (NSTEMI) myocardial infarction: Secondary | ICD-10-CM

## 2017-09-10 NOTE — Progress Notes (Signed)
Discharge Progress Report  Patient Details  Name: Kenneth [name] MRN: [id number] Date of Birth: 19-May-1956 Referring Provider:     CARDIAC REHAB PHASE II ORIENTATION from 08/31/2017 in [place]  Referring Provider  Loralie Champagne MD       Number of Visits: 2  Reason for Discharge:  Early Exit:  foot ulcer  Smoking History:  Social History   Tobacco Use  Smoking Status Former Smoker  Smokeless Tobacco Never Used    Diagnosis:  NSTEMI (non-ST elevated myocardial infarction) ([place])  ADL UCSD:   Initial Exercise Prescription: Initial Exercise Prescription - 08/31/17 1100      Date of Initial Exercise RX and Referring Provider   Date  08/31/17    Referring Provider  Loralie Champagne MD      Bike   Level  0.5    Minutes  10    METs  2.35      NuStep   Level  2    SPM  80    Minutes  10    METs  2      Arm Ergometer   Level  2    Minutes  10    METs  2      Prescription Details   Frequency (times per week)  3    Duration  Progress to 30 minutes of continuous aerobic without signs/symptoms of physical distress      Intensity   THRR 40-80% of Max Heartrate  64-128    Ratings of Perceived Exertion  11-13    Perceived Dyspnea  0-4      Progression   Progression  Continue to progress workloads to maintain intensity without signs/symptoms of physical distress.      Resistance Training   Training Prescription  Yes    Weight  2lbs    Reps  10-15       Discharge Exercise Prescription (Final Exercise Prescription Changes): Exercise Prescription Changes - 09/08/17 1503      Response to Exercise   Blood Pressure (Admit)  110/50    Blood Pressure (Exercise)  140/64    Blood Pressure (Exit)  100/62    Heart Rate (Admit)  71 bpm    Heart Rate (Exercise)  85 bpm    Heart Rate (Exit)  69 bpm    Rating of Perceived Exertion (Exercise)  15    Symptoms  none    Duration  Progress to 30 minutes of  aerobic without signs/symptoms of  physical distress    Intensity  THRR unchanged      Progression   Progression  Continue to progress workloads to maintain intensity without signs/symptoms of physical distress.    Average METs  2.3      Resistance Training   Training Prescription  Yes    Weight  2lbs    Reps  10-15    Time  10 Minutes      Interval Training   Interval Training  No      Bike   Level  0.5    Minutes  10    METs  2.42      NuStep   Level  2    SPM  80    Minutes  10    METs  2.2      Arm Ergometer   Level  2    Minutes  10       Functional Capacity: 6 Minute Walk    Row Name  08/31/17 1152         6 Minute Walk   Phase  Initial     Distance  1302 feet     Walk Time  6 minutes     # of Rest Breaks  0     MPH  2.46     METS  3.3     RPE  11     Perceived Dyspnea   1     VO2 Peak  11.4     Symptoms  Yes (comment)     Comments  SOB     Resting HR  65 bpm     Resting BP  114/64     Resting Oxygen Saturation   97 %     Exercise Oxygen Saturation  during 6 min walk  99 %     Max Ex. HR  70 bpm     Max Ex. BP  120/70     2 Minute Post BP  102/70        Psychological, QOL, Others - Outcomes: PHQ 2/9: Depression screen PHQ 2/9 09/08/2017  Decreased Interest 0  Down, Depressed, Hopeless 0  PHQ - 2 Score 0    Quality of Life: Quality of Life - 08/31/17 0915      Quality of Life Scores   Health/Function Pre  11.2 %    Socioeconomic Pre  15 %    Psych/Spiritual Pre  14.5 %    Family Pre  25.2 %    GLOBAL Pre  14.74 %       Personal Goals: Goals established at orientation with interventions provided to work toward goal. Personal Goals and Risk Factors at Admission - 08/31/17 1233      Core Components/Risk Factors/Patient Goals on Admission   Heart Failure  Yes    Intervention  Provide a combined exercise and nutrition program that is supplemented with education, support and counseling about heart failure. Directed toward relieving symptoms such as shortness of  breath, decreased exercise tolerance, and extremity edema.    Expected Outcomes  Improve functional capacity of life;Short term: Attendance in program 2-3 days a week with increased exercise capacity. Reported lower sodium intake. Reported increased fruit and vegetable intake. Reports medication compliance.;Short term: Daily weights obtained and reported for increase. Utilizing diuretic protocols set by physician.;Long term: Adoption of self-care skills and reduction of barriers for early signs and symptoms recognition and intervention leading to self-care maintenance.        Personal Goals Discharge:   Exercise Goals and Review: Exercise Goals    Row Name 08/31/17 0908             Exercise Goals   Increase Physical Activity  Yes       Intervention  Provide advice, education, support and counseling about physical activity/exercise needs.;Develop an individualized exercise prescription for aerobic and resistive training based on initial evaluation findings, risk stratification, comorbidities and participant's personal goals.       Expected Outcomes  Achievement of increased cardiorespiratory fitness and enhanced flexibility, muscular endurance and strength shown through measurements of functional capacity and personal statement of participant.       Increase Strength and Stamina  Yes       Intervention  Provide advice, education, support and counseling about physical activity/exercise needs.;Develop an individualized exercise prescription for aerobic and resistive training based on initial evaluation findings, risk stratification, comorbidities and participant's personal goals.       Expected Outcomes  Achievement of increased  cardiorespiratory fitness and enhanced flexibility, muscular endurance and strength shown through measurements of functional capacity and personal statement of participant.       Able to understand and use rate of perceived exertion (RPE) scale  Yes       Intervention   Provide education and explanation on how to use RPE scale       Expected Outcomes  Short Term: Able to use RPE daily in rehab to express subjective intensity level;Long Term:  Able to use RPE to guide intensity level when exercising independently       Knowledge and understanding of Target Heart Rate Range (THRR)  Yes       Intervention  Provide education and explanation of THRR including how the numbers were predicted and where they are located for reference       Expected Outcomes  Short Term: Able to state/look up THRR;Short Term: Able to use daily as guideline for intensity in rehab;Long Term: Able to use THRR to govern intensity when exercising independently       Able to check pulse independently  Yes       Intervention  Provide education and demonstration on how to check pulse in carotid and radial arteries.;Review the importance of being able to check your own pulse for safety during independent exercise       Expected Outcomes  Short Term: Able to explain why pulse checking is important during independent exercise;Long Term: Able to check pulse independently and accurately       Understanding of Exercise Prescription  Yes       Intervention  Provide education, explanation, and written materials on patient's individual exercise prescription       Expected Outcomes  Short Term: Able to explain program exercise prescription;Long Term: Able to explain home exercise prescription to exercise independently          Nutrition & Weight - Outcomes: Pre Biometrics - 08/31/17 1157      Pre Biometrics   Height  5\' 6"  (1.676 m)    Weight  150 lb 9.2 oz (68.3 kg)    Waist Circumference  34 inches    Hip Circumference  36.5 inches    Waist to Hip Ratio  0.93 %    BMI (Calculated)  24.31    Triceps Skinfold  20 mm    % Body Fat  24.6 %    Grip Strength  32 kg    Flexibility  9 in    Single Leg Stand  6 seconds      Post Biometrics - 09/08/17 1503       Post  Biometrics   Height  5\' 6"   (1.676 m)    Weight  146 lb 13.2 oz (66.6 kg)    BMI (Calculated)  23.71       Nutrition: Nutrition Therapy & Goals - 09/13/17 0949      Nutrition Therapy   Diet  Renal, Carb Modified, Heart Healthy      Personal Nutrition Goals   Nutrition Goal  Pt to identify food quantities necessary to achieve weight maintenance/prevent further wt gain at graduation from cardiac rehab.      Intervention Plan   Intervention  Prescribe, educate and counsel regarding individualized specific dietary modifications aiming towards targeted core components such as weight, hypertension, lipid management, diabetes, heart failure and other comorbidities.    Expected Outcomes  Short Term Goal: Understand basic principles of dietary content, such as calories, fat, sodium, cholesterol and nutrients.;Long  Term Goal: Adherence to prescribed nutrition plan.       Nutrition Discharge: Nutrition Assessments - 09/01/17 1013      MEDFICTS Scores   Pre Score  15       Education Questionnaire Score: Knowledge Questionnaire Score - 08/31/17 0913      Knowledge Questionnaire Score   Pre Score  16/24       Kenneth [name] will not be able to participate in phase 2 cardiac rehab due to a foot ulcer he is being treated for. Lang attended 2 sessions including orientation and his first day of exercise.  Barnet Pall, RN,BSN 09/23/2017 4:49 PM

## 2017-09-11 ENCOUNTER — Encounter (HOSPITAL_BASED_OUTPATIENT_CLINIC_OR_DEPARTMENT_OTHER): Payer: Self-pay | Admitting: *Deleted

## 2017-09-11 ENCOUNTER — Other Ambulatory Visit: Payer: Self-pay

## 2017-09-11 ENCOUNTER — Emergency Department (HOSPITAL_BASED_OUTPATIENT_CLINIC_OR_DEPARTMENT_OTHER)
Admission: EM | Admit: 2017-09-11 | Discharge: 2017-09-11 | Disposition: A | Discharge status: Home / Self Care | Payer: Medicare Other | Attending: Emergency Medicine | Admitting: Emergency Medicine

## 2017-09-11 DIAGNOSIS — Z7902 Long term (current) use of antithrombotics/antiplatelets: Secondary | ICD-10-CM | POA: Diagnosis not present

## 2017-09-11 DIAGNOSIS — Z79899 Other long term (current) drug therapy: Secondary | ICD-10-CM | POA: Diagnosis not present

## 2017-09-11 DIAGNOSIS — Z87891 Personal history of nicotine dependence: Secondary | ICD-10-CM | POA: Diagnosis not present

## 2017-09-11 DIAGNOSIS — E1122 Type 2 diabetes mellitus with diabetic chronic kidney disease: Secondary | ICD-10-CM | POA: Insufficient documentation

## 2017-09-11 DIAGNOSIS — I2510 Atherosclerotic heart disease of native coronary artery without angina pectoris: Secondary | ICD-10-CM | POA: Insufficient documentation

## 2017-09-11 DIAGNOSIS — N186 End stage renal disease: Secondary | ICD-10-CM | POA: Insufficient documentation

## 2017-09-11 DIAGNOSIS — I5032 Chronic diastolic (congestive) heart failure: Secondary | ICD-10-CM | POA: Diagnosis not present

## 2017-09-11 DIAGNOSIS — Z951 Presence of aortocoronary bypass graft: Secondary | ICD-10-CM | POA: Insufficient documentation

## 2017-09-11 DIAGNOSIS — L97521 Non-pressure chronic ulcer of other part of left foot limited to breakdown of skin: Secondary | ICD-10-CM | POA: Insufficient documentation

## 2017-09-11 DIAGNOSIS — Z7982 Long term (current) use of aspirin: Secondary | ICD-10-CM | POA: Insufficient documentation

## 2017-09-11 DIAGNOSIS — R600 Localized edema: Secondary | ICD-10-CM | POA: Diagnosis present

## 2017-09-11 HISTORY — DX: Dependence on renal dialysis: Z99.2

## 2017-09-11 NOTE — ED Triage Notes (Signed)
Patient states he has a two day history of right foot ulcers on the plantar foot.  States he exercised at Cardiac Rehab and had pain in the right foot.  Later in the day he noticed drainage.  History of DM.  Right foot has open blisters on pads of toes 1-5, and later ball of foot.

## 2017-09-11 NOTE — ED Provider Notes (Signed)
[facility] EMERGENCY DEPARTMENT Provider Note   CSN: [id number] Arrival date & time: 09/11/17  6659     History   Chief Complaint Chief Complaint  Patient presents with  . Skin Ulcer    right foot    HPI Kenneth [name] is a 61 y.o. male.  HPI 61yo male with PMH of CHF, DM2, ESRD, CAD with CABG x 3 who presents with foot ulcers on the left foot. Patient reports he started cardiac rehab and had a session on Wednesday for about 2 hours. Later that evening, he noticed "water" the bottom of his left foot and this is when he noticed the blisters. The blisters popped since then. Denies any fevers or chills. He reports of history of diabetic foot ulcer on the right foot that was complicated by cellulitis, which has now resolved. His last A1c in 03/2017 was 5.8.   Past Medical History:  Diagnosis Date  . Acute pulmonary edema ([place]) 05/31/2016  . Acute respiratory failure with hypoxemia ([place]) 05/31/2016  . CHF (congestive heart failure) ([place])   . CRF (chronic renal failure), stage 5 ([place])   . 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 ESRD (end stage renal disease) on dialysis [facility])    "MWF; [facility]" (01/18/2017)  . HCAP (healthcare-associated pneumonia)   . Hemodialysis status ([place])   . Ischemic rest pain of lower extremity ([place]) 02/06/2015  . 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 NSTEMI (non-ST elevated myocardial infarction) ([place])   . Pain in lower limb 02/06/2015  . Pain in the chest   . Pleural effusion   . 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 Shortness of breath     Patient Active Problem List   Diagnosis Date Noted  . Hyperkalemia 07/20/2017  . Non-ST elevated myocardial infarction ([place]) 07/05/2017  . Severe comorbid illness   . Chronic diastolic CHF (congestive heart failure) ([place]) 04/14/2017  . Hypertensive emergency 04/14/2017  . Syncope 04/13/2017  . Anemia, chronic renal failure 04/11/2017  . Volume overload 04/11/2017  .  Diabetic foot ulcer ([place]) 04/11/2017  . Hypertensive crisis   . Cellulitis in diabetic foot ([place]) 01/18/2017  . Chest pain 12/16/2016  . Abdominal pain 12/16/2016  . Age-related nuclear cataract of right eye 12/09/2016  . Pressure injury of skin 11/03/2016  . Diabetes mellitus with complication ([place]) 93/57/0177  . Elevated troponin I level 10/31/2016  . ESRD (end stage renal disease) ([place]) 10/30/2016  . CAD (coronary artery disease) 10/30/2016  . Gastroesophageal reflux disease 08/18/2016  . 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 CHF (congestive heart failure), NYHA class I, chronic, systolic ([place])   . Bursitis of left shoulder 05/31/2016  . 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 Ischemic rest pain of lower extremity ([place]) 02/06/2015    Past Surgical History:  Procedure Laterality Date  . AV FISTULA PLACEMENT Left 06/22/2016   Procedure: ARTERIOVENOUS (AV) FISTULA CREATION LEFT UPPER ARM;  Surgeon: Rosetta Posner, MD;  Location: [facility];  Service: Vascular;  Laterality: Left;  . CARDIAC CATHETERIZATION N/A 05/31/2016   Procedure: Left Heart Cath and Coronary Angiography;  Surgeon: Jettie Booze, MD;  Location: [facility] CV LAB;  Service: Cardiovascular;  Laterality: N/A;  . CARDIAC CATHETERIZATION N/A 05/31/2016   Procedure: Right Heart Cath;  Surgeon: Jettie Booze, MD;  Location: [facility] CV LAB;  Service: Cardiovascular;  Laterality: N/A;  . CARDIAC  CATHETERIZATION N/A 05/31/2016   Procedure: IABP Insertion;  Surgeon: Jettie Booze, MD;  Location: [facility] CV LAB;  Service: Cardiovascular;  Laterality: N/A;  . CORONARY ARTERY BYPASS GRAFT N/A 06/05/2016   Procedure: CORONARY ARTERY BYPASS GRAFTING (CABG) x3 LIMA to LAD -SVG to OM -SVG to RCA;  Surgeon: Ivin Poot, MD;  Location: [facility];  Service: Open Heart Surgery;  Laterality: N/A;  . CORONARY STENT INTERVENTION N/A 07/07/2017    Procedure: CORONARY STENT INTERVENTION;  Surgeon: Wellington Hampshire, MD;  Location: [facility] CV LAB;  Service: Cardiovascular;  Laterality: N/A;  . INSERTION OF DIALYSIS CATHETER N/A 06/05/2016   Procedure: INSERTION OF DIALYSIS/trialysis CATHETER;  Surgeon: Ivin Poot, MD;  Location: [facility];  Service: Vascular;  Laterality: N/A;  . INSERTION OF DIALYSIS CATHETER Right 06/13/2016   Procedure: INSERTION OF DIALYSIS CATHETER RIGHT INTERNAL JUGULAR;  Surgeon: Conrad Dallam, MD;  Location: [facility];  Service: Vascular;  Laterality: Right;  . INTRAOPERATIVE TRANSESOPHAGEAL ECHOCARDIOGRAM N/A 06/05/2016   Procedure: INTRAOPERATIVE TRANSESOPHAGEAL ECHOCARDIOGRAM;  Surgeon: Ivin Poot, MD;  Location: [facility];  Service: Open Heart Surgery;  Laterality: N/A;  . LEFT HEART CATH AND CORS/GRAFTS ANGIOGRAPHY N/A 11/03/2016   Procedure: Left Heart Cath and Cors/Grafts Angiography;  Surgeon: Troy Sine, MD;  Location: [place] CV LAB;  Service: Cardiovascular;  Laterality: N/A;  . LEFT HEART CATH AND CORS/GRAFTS ANGIOGRAPHY N/A 07/07/2017   Procedure: LEFT HEART CATH AND CORS/GRAFTS ANGIOGRAPHY;  Surgeon: Larey Dresser, MD;  Location: [facility] CV LAB;  Service: Cardiovascular;  Laterality: N/A;       Home Medications    Prior to Admission medications   Medication Sig Start Date End Date Taking? Authorizing Provider  aspirin 81 MG chewable tablet Chew 1 tablet (81 mg total) by mouth daily. 11/06/16   Caren Griffins, MD  atorvastatin (LIPITOR) 80 MG tablet TAKE 1 TABLET BY MOUTH ONCE DAILY 08/10/17   Larey Dresser, MD  citalopram (CELEXA) 20 MG tablet Take 20 mg by mouth daily. 02/25/17   [provider]  clopidogrel (PLAVIX) 75 MG tablet Take 1 tablet (75 mg total) by mouth daily. 01/12/17   Bensimhon, Shaune Pascal, MD  ezetimibe (ZETIA) 10 MG tablet Take 1 tablet (10 mg total) by mouth daily. 02/02/17   Larey Dresser, MD  gabapentin (NEURONTIN) 300 MG capsule Take 300 mg by mouth at  bedtime. 02/11/17   [provider]  hydrALAZINE (APRESOLINE) 25 MG tablet Take 1 tablet (25 mg total) by mouth 2 (two) times daily. 07/08/17   Danford, Suann Larry, MD  Insulin Human (INSULIN PUMP) SOLN Inject into the skin continuous. NOVOLOG    [provider]  isosorbide mononitrate (IMDUR) 30 MG 24 hr tablet Take 1 tablet (30 mg total) by mouth daily. 04/14/17   Hongalgi, Lenis Dickinson, MD  losartan (COZAAR) 50 MG tablet Take 1 tablet (50 mg total) by mouth daily. 04/14/17   Hongalgi, Lenis Dickinson, MD  metoprolol succinate (TOPROL-XL) 25 MG 24 hr tablet Take 1 tablet (25 mg total) by mouth at bedtime. 04/13/17   Hongalgi, Lenis Dickinson, MD  pantoprazole (PROTONIX) 40 MG tablet Take 40 mg by mouth daily.  01/07/17   [provider]    Family History Family History  Problem Relation Age of Onset  . CAD Father   . Colon cancer Father   . Diabetes Brother     Social History Social History   Tobacco Use  . Smoking  status: Former Research scientist (life sciences)  . Smokeless tobacco: Never Used  Substance Use Topics  . Alcohol use: No    Alcohol/week: 0.0 oz  . Drug use: No     Allergies   Patient has no known allergies.   Review of Systems Review of Systems  Constitutional: Negative for chills and fever.     Physical Exam Updated Vital Signs BP (!) 145/65 (BP Location: Right Arm)   Pulse 72   Temp 98.3 F (36.8 C)   Resp 20   Ht 5\' 8"  (1.727 m)   Wt 77.1 kg (170 lb)   SpO2 98%   BMI 25.85 kg/m   Physical Exam  Constitutional: He is oriented to person, place, and time. He appears well-developed and well-nourished. No distress.  Cardiovascular: Normal rate and regular rhythm.  Pulmonary/Chest: Effort normal and breath sounds normal.  Neurological: He is alert and oriented to person, place, and time.  Skin: Skin is warm. Capillary refill takes less than 2 seconds.  Left foot: image below. Dorsalis pedis and posterior tibial pulse. Normal sensation to light touch. The blistered  areas do not appear infected.   Psychiatric: He has a normal mood and affect. His behavior is normal. Judgment and thought content normal.       ED Treatments / Results  Labs (all labs ordered are listed, but only abnormal results are displayed) Labs Reviewed - No data to display  EKG  EKG Interpretation None       Radiology No results found.  Procedures Procedures (including critical care time)  Medications Ordered in ED Medications - No data to display   Initial Impression / Assessment and Plan / ED Course  I have reviewed the triage vital signs and the nursing notes.  Pertinent labs & imaging results that were available during my care of the patient were reviewed by me and considered in my medical decision making (see chart for details).  61yo male with PMH of CHF, DM2, ESRD, CAD with CABG x 3 who presents with foot ulcers on the left foot. Ulcers appear superficial. No signs of infection. Foot was dressed with non-adhesive dressing and gauze, and patient was provided crutches to avoid weight on the left foot. Referral made to podiatry.   Final Clinical Impressions(s) / ED Diagnoses   Final diagnoses:  Skin ulcer of left foot, limited to breakdown of skin [facility])    ED Discharge Orders        Ordered    Ambulatory referral to Podiatry     09/11/17 1020       Smiley Houseman, MD 09/11/17 1156    Margette Fast, MD 09/11/17 2057

## 2017-09-11 NOTE — Discharge Instructions (Signed)
The foot ulcers do not appear infected. Please keep the area clean and dry. Please use crutches and avoid putting pressure on your left foot so that it can heal. Please call Dr. Amalia Hailey who is a foot doctor to schedule a follow up appointment. Please monitor for signs of infection.

## 2017-09-11 NOTE — ED Notes (Signed)
Pt has post op shoe applied from home.

## 2017-09-12 ENCOUNTER — Encounter (HOSPITAL_COMMUNITY): Payer: Self-pay | Admitting: Emergency Medicine

## 2017-09-12 ENCOUNTER — Observation Stay (HOSPITAL_COMMUNITY)
Admission: EM | Admit: 2017-09-12 | Discharge: 2017-09-13 | Disposition: A | Discharge status: Home / Self Care | Payer: Medicare Other | Attending: Oncology | Admitting: Oncology

## 2017-09-12 ENCOUNTER — Emergency Department (HOSPITAL_COMMUNITY): Payer: Medicare Other

## 2017-09-12 DIAGNOSIS — I222 Subsequent non-ST elevation (NSTEMI) myocardial infarction: Secondary | ICD-10-CM | POA: Diagnosis not present

## 2017-09-12 DIAGNOSIS — Z955 Presence of coronary angioplasty implant and graft: Secondary | ICD-10-CM | POA: Diagnosis not present

## 2017-09-12 DIAGNOSIS — Z951 Presence of aortocoronary bypass graft: Secondary | ICD-10-CM | POA: Diagnosis not present

## 2017-09-12 DIAGNOSIS — I252 Old myocardial infarction: Secondary | ICD-10-CM | POA: Insufficient documentation

## 2017-09-12 DIAGNOSIS — Z794 Long term (current) use of insulin: Secondary | ICD-10-CM | POA: Diagnosis not present

## 2017-09-12 DIAGNOSIS — L03116 Cellulitis of left lower limb: Secondary | ICD-10-CM

## 2017-09-12 DIAGNOSIS — R9431 Abnormal electrocardiogram [ECG] [EKG]: Secondary | ICD-10-CM

## 2017-09-12 DIAGNOSIS — N186 End stage renal disease: Secondary | ICD-10-CM | POA: Insufficient documentation

## 2017-09-12 DIAGNOSIS — I5030 Unspecified diastolic (congestive) heart failure: Secondary | ICD-10-CM | POA: Diagnosis not present

## 2017-09-12 DIAGNOSIS — K219 Gastro-esophageal reflux disease without esophagitis: Secondary | ICD-10-CM | POA: Diagnosis not present

## 2017-09-12 DIAGNOSIS — Z833 Family history of diabetes mellitus: Secondary | ICD-10-CM

## 2017-09-12 DIAGNOSIS — I110 Hypertensive heart disease with heart failure: Secondary | ICD-10-CM | POA: Insufficient documentation

## 2017-09-12 DIAGNOSIS — E875 Hyperkalemia: Secondary | ICD-10-CM | POA: Diagnosis present

## 2017-09-12 DIAGNOSIS — E1122 Type 2 diabetes mellitus with diabetic chronic kidney disease: Secondary | ICD-10-CM | POA: Diagnosis not present

## 2017-09-12 DIAGNOSIS — E785 Hyperlipidemia, unspecified: Secondary | ICD-10-CM | POA: Diagnosis not present

## 2017-09-12 DIAGNOSIS — S90822A Blister (nonthermal), left foot, initial encounter: Secondary | ICD-10-CM

## 2017-09-12 DIAGNOSIS — S90415A Abrasion, left lesser toe(s), initial encounter: Secondary | ICD-10-CM | POA: Diagnosis not present

## 2017-09-12 DIAGNOSIS — I119 Hypertensive heart disease without heart failure: Secondary | ICD-10-CM

## 2017-09-12 DIAGNOSIS — R079 Chest pain, unspecified: Secondary | ICD-10-CM

## 2017-09-12 DIAGNOSIS — I132 Hypertensive heart and chronic kidney disease with heart failure and with stage 5 chronic kidney disease, or end stage renal disease: Secondary | ICD-10-CM | POA: Insufficient documentation

## 2017-09-12 DIAGNOSIS — Z79899 Other long term (current) drug therapy: Secondary | ICD-10-CM

## 2017-09-12 DIAGNOSIS — I509 Heart failure, unspecified: Secondary | ICD-10-CM | POA: Diagnosis not present

## 2017-09-12 DIAGNOSIS — E1140 Type 2 diabetes mellitus with diabetic neuropathy, unspecified: Secondary | ICD-10-CM

## 2017-09-12 DIAGNOSIS — X58XXXA Exposure to other specified factors, initial encounter: Secondary | ICD-10-CM | POA: Diagnosis not present

## 2017-09-12 DIAGNOSIS — R0789 Other chest pain: Secondary | ICD-10-CM | POA: Diagnosis not present

## 2017-09-12 DIAGNOSIS — R10816 Epigastric abdominal tenderness: Secondary | ICD-10-CM | POA: Diagnosis not present

## 2017-09-12 DIAGNOSIS — Z9641 Presence of insulin pump (external) (internal): Secondary | ICD-10-CM

## 2017-09-12 DIAGNOSIS — Z992 Dependence on renal dialysis: Secondary | ICD-10-CM | POA: Insufficient documentation

## 2017-09-12 DIAGNOSIS — L089 Local infection of the skin and subcutaneous tissue, unspecified: Secondary | ICD-10-CM

## 2017-09-12 DIAGNOSIS — E11628 Type 2 diabetes mellitus with other skin complications: Secondary | ICD-10-CM

## 2017-09-12 DIAGNOSIS — I2510 Atherosclerotic heart disease of native coronary artery without angina pectoris: Secondary | ICD-10-CM | POA: Diagnosis not present

## 2017-09-12 DIAGNOSIS — Z7982 Long term (current) use of aspirin: Secondary | ICD-10-CM | POA: Diagnosis not present

## 2017-09-12 DIAGNOSIS — Z8249 Family history of ischemic heart disease and other diseases of the circulatory system: Secondary | ICD-10-CM

## 2017-09-12 HISTORY — DX: Gastro-esophageal reflux disease without esophagitis: K21.9

## 2017-09-12 HISTORY — DX: Non-pressure chronic ulcer of other part of unspecified foot with unspecified severity: L97.509

## 2017-09-12 HISTORY — DX: Hypertensive heart disease without heart failure: I11.9

## 2017-09-12 HISTORY — DX: Atherosclerotic heart disease of native coronary artery without angina pectoris: I25.10

## 2017-09-12 HISTORY — DX: Type 2 diabetes mellitus with foot ulcer: E11.621

## 2017-09-12 LAB — CBC WITH DIFFERENTIAL/PLATELET
Basophils Absolute: 0 10*3/uL (ref 0.0–0.1)
Basophils Relative: 0 %
EOS ABS: 0.2 10*3/uL (ref 0.0–0.7)
EOS PCT: 1 %
HCT: 30.9 % — ABNORMAL LOW (ref 39.0–52.0)
Hemoglobin: 10.2 g/dL — ABNORMAL LOW (ref 13.0–17.0)
LYMPHS ABS: 2 10*3/uL (ref 0.7–4.0)
LYMPHS PCT: 14 %
MCH: 30.4 pg (ref 26.0–34.0)
MCHC: 33 g/dL (ref 30.0–36.0)
MCV: 92.2 fL (ref 78.0–100.0)
MONO ABS: 1.2 10*3/uL — AB (ref 0.1–1.0)
Monocytes Relative: 8 %
Neutro Abs: 11.6 10*3/uL — ABNORMAL HIGH (ref 1.7–7.7)
Neutrophils Relative %: 77 %
PLATELETS: 139 10*3/uL — AB (ref 150–400)
RBC: 3.35 MIL/uL — AB (ref 4.22–5.81)
RDW: 14 % (ref 11.5–15.5)
WBC: 15 10*3/uL — AB (ref 4.0–10.5)

## 2017-09-12 LAB — BASIC METABOLIC PANEL
ANION GAP: 15 (ref 5–15)
BUN: 34 mg/dL — AB (ref 6–20)
CALCIUM: 8.9 mg/dL (ref 8.9–10.3)
CO2: 27 mmol/L (ref 22–32)
Chloride: 90 mmol/L — ABNORMAL LOW (ref 101–111)
Creatinine, Ser: 7.19 mg/dL — ABNORMAL HIGH (ref 0.61–1.24)
GFR calc Af Amer: 9 mL/min — ABNORMAL LOW (ref >60)
GFR, EST NON AFRICAN AMERICAN: 7 mL/min — AB (ref >60)
GLUCOSE: 167 mg/dL — AB (ref 65–99)
POTASSIUM: 5.6 mmol/L — AB (ref 3.5–5.1)
SODIUM: 132 mmol/L — AB (ref 135–145)

## 2017-09-12 LAB — GLUCOSE, CAPILLARY
GLUCOSE-CAPILLARY: 105 mg/dL — AB (ref 65–99)
GLUCOSE-CAPILLARY: 151 mg/dL — AB (ref 65–99)

## 2017-09-12 LAB — TROPONIN I
Troponin I: 0.04 ng/mL (ref <0.03)
Troponin I: 0.03 ng/mL (ref <0.03)

## 2017-09-12 LAB — I-STAT TROPONIN, ED: TROPONIN I, POC: 0.03 ng/mL (ref 0.00–0.08)

## 2017-09-12 LAB — I-STAT CG4 LACTIC ACID, ED: Lactic Acid, Venous: 0.92 mmol/L (ref 0.5–1.9)

## 2017-09-12 MED ORDER — ACETAMINOPHEN 650 MG RE SUPP: 650.0000 mg | Freq: Four times a day (QID) | RECTAL | Status: DC | PRN

## 2017-09-12 MED ORDER — INSULIN ASPART 100 UNIT/ML ~~LOC~~ SOLN
0.0000 [IU] | Freq: Three times a day (TID) | SUBCUTANEOUS | Status: DC
  Administered 2017-09-13: 2 [IU] via SUBCUTANEOUS

## 2017-09-12 MED ORDER — CITALOPRAM HYDROBROMIDE 20 MG PO TABS
20.0000 mg | ORAL_TABLET | Freq: Every day | ORAL | Status: DC
  Administered 2017-09-12 – 2017-09-13 (×2): 20 mg via ORAL
  Filled 2017-09-12: qty 1
  Filled 2017-09-12: qty 2

## 2017-09-12 MED ORDER — LOSARTAN POTASSIUM 50 MG PO TABS
50.0000 mg | ORAL_TABLET | Freq: Every day | ORAL | Status: DC
  Administered 2017-09-13: 50 mg via ORAL
  Filled 2017-09-12 (×2): qty 1

## 2017-09-12 MED ORDER — FUROSEMIDE 10 MG/ML IJ SOLN
40.0000 mg | Freq: Once | INTRAMUSCULAR | Status: DC
  Filled 2017-09-12: qty 4

## 2017-09-12 MED ORDER — METOPROLOL SUCCINATE ER 25 MG PO TB24
25.0000 mg | ORAL_TABLET | Freq: Every day | ORAL | Status: DC
  Administered 2017-09-12: 25 mg via ORAL
  Filled 2017-09-12 (×2): qty 1

## 2017-09-12 MED ORDER — CLOPIDOGREL BISULFATE 75 MG PO TABS
75.0000 mg | ORAL_TABLET | Freq: Every day | ORAL | Status: DC
  Administered 2017-09-12 – 2017-09-13 (×2): 75 mg via ORAL
  Filled 2017-09-12 (×2): qty 1

## 2017-09-12 MED ORDER — PIPERACILLIN-TAZOBACTAM IN DEX 2-0.25 GM/50ML IV SOLN: 2.2500 g | Freq: Once | INTRAVENOUS | Status: DC

## 2017-09-12 MED ORDER — VANCOMYCIN HCL 10 G IV SOLR
1500.0000 mg | Freq: Once | INTRAVENOUS | Status: AC
  Administered 2017-09-12: 1500 mg via INTRAVENOUS
  Filled 2017-09-12: qty 1500

## 2017-09-12 MED ORDER — INSULIN PUMP
Freq: Three times a day (TID) | SUBCUTANEOUS | Status: DC
  Filled 2017-09-12: qty 1

## 2017-09-12 MED ORDER — PANTOPRAZOLE SODIUM 40 MG PO TBEC
40.0000 mg | DELAYED_RELEASE_TABLET | Freq: Every day | ORAL | Status: DC
  Administered 2017-09-12 – 2017-09-13 (×2): 40 mg via ORAL
  Filled 2017-09-12 (×2): qty 1

## 2017-09-12 MED ORDER — ASPIRIN 81 MG PO CHEW
81.0000 mg | CHEWABLE_TABLET | Freq: Every day | ORAL | Status: DC
  Administered 2017-09-13: 81 mg via ORAL
  Filled 2017-09-12: qty 1

## 2017-09-12 MED ORDER — SODIUM POLYSTYRENE SULFONATE 15 GM/60ML PO SUSP
15.0000 g | Freq: Once | ORAL | Status: DC
  Filled 2017-09-12: qty 60

## 2017-09-12 MED ORDER — ATORVASTATIN CALCIUM 80 MG PO TABS
80.0000 mg | ORAL_TABLET | Freq: Every day | ORAL | Status: DC
  Administered 2017-09-12: 80 mg via ORAL
  Filled 2017-09-12 (×2): qty 1

## 2017-09-12 MED ORDER — ACETAMINOPHEN 325 MG PO TABS: 650.0000 mg | ORAL_TABLET | Freq: Four times a day (QID) | ORAL | Status: DC | PRN

## 2017-09-12 MED ORDER — PIPERACILLIN-TAZOBACTAM 3.375 G IVPB 30 MIN
3.3750 g | Freq: Once | INTRAVENOUS | Status: AC
  Administered 2017-09-12: 3.375 g via INTRAVENOUS
  Filled 2017-09-12: qty 50

## 2017-09-12 MED ORDER — GABAPENTIN 100 MG PO CAPS
100.0000 mg | ORAL_CAPSULE | Freq: Every day | ORAL | Status: DC
  Administered 2017-09-12: 100 mg via ORAL
  Filled 2017-09-12: qty 1

## 2017-09-12 MED ORDER — DOXERCALCIFEROL 4 MCG/2ML IV SOLN
2.0000 ug | INTRAVENOUS | Status: DC
  Filled 2017-09-12: qty 2

## 2017-09-12 MED ORDER — HYDRALAZINE HCL 25 MG PO TABS
25.0000 mg | ORAL_TABLET | Freq: Two times a day (BID) | ORAL | Status: DC
  Administered 2017-09-12 – 2017-09-13 (×2): 25 mg via ORAL
  Filled 2017-09-12 (×2): qty 1

## 2017-09-12 MED ORDER — NAPROXEN 250 MG PO TABS
500.0000 mg | ORAL_TABLET | Freq: Two times a day (BID) | ORAL | Status: DC
  Administered 2017-09-12 – 2017-09-13 (×2): 500 mg via ORAL
  Filled 2017-09-12 (×3): qty 2

## 2017-09-12 MED ORDER — EZETIMIBE 10 MG PO TABS
10.0000 mg | ORAL_TABLET | Freq: Every day | ORAL | Status: DC
Start: 2017-09-12 — End: 2017-09-13
  Administered 2017-09-12 – 2017-09-13 (×2): 10 mg via ORAL
  Filled 2017-09-12 (×3): qty 1

## 2017-09-12 MED ORDER — ACETAMINOPHEN 325 MG PO TABS
650.0000 mg | ORAL_TABLET | Freq: Once | ORAL | Status: AC
  Administered 2017-09-12: 650 mg via ORAL
  Filled 2017-09-12: qty 2

## 2017-09-12 MED ORDER — CALCIUM ACETATE (PHOS BINDER) 667 MG PO CAPS
667.0000 mg | ORAL_CAPSULE | Freq: Three times a day (TID) | ORAL | Status: DC
  Administered 2017-09-13 (×2): 667 mg via ORAL
  Filled 2017-09-12 (×3): qty 1

## 2017-09-12 MED ORDER — ISOSORBIDE MONONITRATE ER 30 MG PO TB24
30.0000 mg | ORAL_TABLET | Freq: Every day | ORAL | Status: DC
  Administered 2017-09-13: 30 mg via ORAL
  Filled 2017-09-12: qty 1

## 2017-09-12 NOTE — Progress Notes (Signed)
Pt received from ED RN. Pt and wife oriented to room and equipment. Call bell within reach. Telemetry applied, CCMD notified. Vitals recorded. Attending team paged regarding BP, diet order, and CBG monitoring.   Fritz Pickerel, RN

## 2017-09-12 NOTE — Progress Notes (Signed)
CRITICAL VALUE ALERT  Critical Value:  0.04  Provider Notified: Dr. Hedwig Morton  Orders Received/Actions taken: Spoke with Dr. Hedwig Morton. Will continue to trend troponin. No new orders received. Pt asleep in bed. Resting comfortably. Denies chest pain.

## 2017-09-12 NOTE — Progress Notes (Signed)
Spoke with Dr. Hedwig Morton. As cardiology plans no urgent interventions verbal order for renal/carb modified diet received. Dr. Hedwig Morton addressing insulin pump and pain control orders. Pt has insulin pump site to lower abdomen, will ask pt to reattach pump per Dr. Hedwig Morton. CBG at 1627 was 105.  Pt left toe wounds covered in dry gauze and krelex. WOC consult already placed.   Fritz Pickerel, RN

## 2017-09-12 NOTE — Progress Notes (Signed)
[name] is a 61 Y/O Asian male with ESRD on hemodialysis MWF at [facility]. PMH of CAD, CABG 2017, Afib (not on coumadin), NSTEMI  PTCI /DES SVG OM Graft 05/8118, DMT2, diastolic HF. Patient developed chest pain this AM at HD unit and was sent to ED for evaluation of chest pain without receiving hemodialysis. He has been admitted as observation patient for chest pain. We will order and supervise hemodialysis 09/13/2017. Please notify us if patient status is upgraded to in-patient and we will consult formally.   Dialysis Orders: MWF [facility] 4 hr 180 NRe 400/800 65.5 kg 2.0 K/ 2.25 Ca  -Heparin 4500 units IV TIW -Hectorol 2 mcg IV TIW   He has not been getting to OP EDW so will increase EDW to 66 kg.  BMD meds:  Calcium acetate 667 mg PO TID AC   Last labs: HGB 12.2 Phos 5.2 Ca 8.9 08/2017  [place] [phone] (Pager)  Pt seen, examined and agree w A/P as above.  Kelly Splinter MD [employer] pager [phone]   09/12/2017, 5:24 PM

## 2017-09-12 NOTE — ED Triage Notes (Signed)
Pt here from dialysis with c/o non radiating chest pain , pt received 3 mins of dialysis ,  2 nitro 324mg  asa and 6mg  morphine given by ems

## 2017-09-12 NOTE — ED Notes (Signed)
Report given to rn on 4700 

## 2017-09-12 NOTE — Progress Notes (Signed)
Pt politely refuses to wear insulin pump while in the hospital. Wife has taken insulin pump home. Dr. Hedwig Morton made aware.   Fritz Pickerel, RN

## 2017-09-12 NOTE — H&P (Signed)
Date: 09/12/2017               Patient Name:  Kenneth [name] MRN: [id number]  DOB: Jul 20, 1956 Age / Sex: 61 y.o., male   PCP: [name], MD         Medical Service: Internal Medicine Teaching Service         Attending Physician: Dr. Annia Belt, MD    First Contact: Dr. Thomasene Ripple Pager: [phone]  Second Contact: Dr. Einar Gip Pager: [phone]       After Hours (After 5p/  First Contact Pager: [phone]  weekends / holidays): Second Contact Pager: [phone]   Chief Complaint: Chest and L foot pain  History of Present Illness:  Kenneth [name] is 61 yo with PMH of CAD s/p CABGx3 with NSTEMI and DES placement in 06/2017, ESRD on HD MWF, V3XT complicated by neuropathy, HTN, diastolic CHF (EF 06% on 2694 echo) and GERD who presents for evaluation of chest pain that started this morning when he arrived to dialysis. The history was taken with assistance of an interpretor, as the patient's preferred language is [place]. Patient states that he went to dialysis this morning and he suddenly noticed the acute onset of substernal/epigastric chest pain. He describes the pain as something squeezing or pounding on his heart. It was associated with diaphoresis and nausea. It did not radiate to his neck or his jaw. He states that he has had this type of pain before. He first noticed it at the beginning of his hospitalization in October, when he had his NSTEMI. After having his stent placed, however, this chest pain went away for about 1 month. Since then, however, it has intermittently returned. The patient has not noticed any specific aggravating or alleviating factors, stating that the pain can come when sitting down or exercising. He states that it usually self resolves within 1 hour or so. He has experienced multiple episodes of this over the past month but does not know how many times it has occurred. It usually doesn't bother him enough to come to the emergency room because he likes to  avoid the hospital.  He also noticed some problems with his left foot over the course of the past few days. The patient states that he first noticed pain in his left foot 4 days ago, when he was walking his house. At that time he noticed multiple blisters with serosanguinous drainage. He denied any purulent drainage. He states that since then he has noticed worsening pain and erythema in his left foot, which has significantly worsened over the past 24 hours. He intermittently notes a sharp pain that radiates from his foot and up his leg, which he states is new within the past 24 hours. He thinks he first got the blisters after exercising a lot during cardiac rehab a one to two days prior to developing the serosanguinous drainage.  Patient denies shortness of breath, lower abdominal pain, vomiting, lower extremity swelling, and change in bowel/bladder habits.  In the ED, the patient was afebrile, non-tachycardic, saturating well on room air, and mildly hypertensive. [name] EKG on arrival was negative for ST elevation in contiguous leads and his POC troponin was within normal limits. He was found to have a leukocytosis to 85.4 with neutrophilic predominance. The ED physician's physical exam was concerning for lower extremity cellulitis and he was started on broad spectrum antibiotics. His foot X rays were negative for osteomyelitis.The patient was admitted to the  internal medicine management with nephrology and cardiology consulting.   Meds:  Current Meds  Medication Sig  . acetaminophen (TYLENOL) 500 MG tablet Take 500 mg by mouth every 6 (six) hours as needed for mild pain.  Marland Kitchen aspirin 81 MG chewable tablet Chew 1 tablet (81 mg total) by mouth daily.  Marland Kitchen atorvastatin (LIPITOR) 80 MG tablet TAKE 1 TABLET BY MOUTH ONCE DAILY  . calcium acetate (PHOSLO) 667 MG capsule Take 667 mg by mouth 3 (three) times daily with meals.   . citalopram (CELEXA) 20 MG tablet Take 20 mg by mouth daily.  .  clopidogrel (PLAVIX) 75 MG tablet Take 1 tablet (75 mg total) by mouth daily.  Marland Kitchen ezetimibe (ZETIA) 10 MG tablet Take 1 tablet (10 mg total) by mouth daily.  Marland Kitchen gabapentin (NEURONTIN) 100 MG capsule Take 100 mg by mouth at bedtime.   . hydrALAZINE (APRESOLINE) 25 MG tablet Take 1 tablet (25 mg total) by mouth 2 (two) times daily.  . Insulin Human (INSULIN PUMP) SOLN Inject 10-70 each into the skin continuous. NOVOLOG  . isosorbide mononitrate (IMDUR) 30 MG 24 hr tablet Take 1 tablet (30 mg total) by mouth daily.  Marland Kitchen losartan (COZAAR) 50 MG tablet Take 1 tablet (50 mg total) by mouth daily.  . metoprolol succinate (TOPROL-XL) 25 MG 24 hr tablet Take 1 tablet (25 mg total) by mouth at bedtime.  . pantoprazole (PROTONIX) 40 MG tablet Take 40 mg by mouth daily.   Marland Kitchen Propylene Glycol (SYSTANE BALANCE) 0.6 % SOLN Place 1 drop into both eyes as needed (dry eyes).   Allergies: Allergies as of 09/12/2017  . (No Known Allergies)   Past Medical History: Past Medical History:  Diagnosis Date  . Acute pulmonary edema ([place]) 05/31/2016  . Acute respiratory failure with hypoxemia ([place]) 05/31/2016  . CHF (congestive heart failure) ([place])   . CRF (chronic renal failure), stage 5 ([place])   . 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 ESRD (end stage renal disease) on dialysis [facility])    "MWF; [place] [facility]" (01/18/2017)  . HCAP (healthcare-associated pneumonia)   . Hemodialysis status ([place])   . Ischemic rest pain of lower extremity ([place]) 02/06/2015  . 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 NSTEMI (non-ST elevated myocardial infarction) ([place])   . Pain in lower limb 02/06/2015  . Pain in the chest   . Pleural effusion   . 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 Shortness of breath    Past Surgical History: Past Surgical History:  Procedure Laterality Date  . AV FISTULA PLACEMENT Left 06/22/2016   Procedure: ARTERIOVENOUS (AV) FISTULA CREATION LEFT UPPER ARM;  Surgeon: Rosetta Posner, MD;   Location: [facility];  Service: Vascular;  Laterality: Left;  . CARDIAC CATHETERIZATION N/A 05/31/2016   Procedure: Left Heart Cath and Coronary Angiography;  Surgeon: Jettie Booze, MD;  Location: [place] [facility];  Service: Cardiovascular;  Laterality: N/A;  . CARDIAC CATHETERIZATION N/A 05/31/2016   Procedure: Right Heart Cath;  Surgeon: Jettie Booze, MD;  Location: [place] [facility];  Service: Cardiovascular;  Laterality: N/A;  . CARDIAC CATHETERIZATION N/A 05/31/2016   Procedure: IABP Insertion;  Surgeon: Jettie Booze, MD;  Location: [place] [facility];  Service: Cardiovascular;  Laterality: N/A;  . CORONARY ARTERY BYPASS GRAFT N/A 06/05/2016   Procedure: CORONARY ARTERY BYPASS GRAFTING (CABG) x3 LIMA to LAD -SVG to OM -SVG to RCA;  Surgeon: Ivin Poot, MD;  Location: [facility];  Service:  Open Heart Surgery;  Laterality: N/A;  . CORONARY STENT INTERVENTION N/A 07/07/2017   Procedure: CORONARY STENT INTERVENTION;  Surgeon: Wellington Hampshire, MD;  Location: [place];  Service: Cardiovascular;  Laterality: N/A;  . INSERTION OF DIALYSIS CATHETER N/A 06/05/2016   Procedure: INSERTION OF DIALYSIS/trialysis CATHETER;  Surgeon: Ivin Poot, MD;  Location: [facility];  Service: Vascular;  Laterality: N/A;  . INSERTION OF DIALYSIS CATHETER Right 06/13/2016   Procedure: INSERTION OF DIALYSIS CATHETER RIGHT INTERNAL JUGULAR;  Surgeon: Conrad Southside, MD;  Location: [place] [facility];  Service: Vascular;  Laterality: Right;  . INTRAOPERATIVE TRANSESOPHAGEAL ECHOCARDIOGRAM N/A 06/05/2016   Procedure: INTRAOPERATIVE TRANSESOPHAGEAL ECHOCARDIOGRAM;  Surgeon: Ivin Poot, MD;  Location: [facility];  Service: Open Heart Surgery;  Laterality: N/A;  . LEFT HEART CATH AND CORS/GRAFTS ANGIOGRAPHY N/A 11/03/2016   Procedure: Left Heart Cath and Cors/Grafts Angiography;  Surgeon: Troy Sine, MD;  Location: [facility] [facility];  Service: Cardiovascular;  Laterality: N/A;  . LEFT HEART CATH AND CORS/GRAFTS  ANGIOGRAPHY N/A 07/07/2017   Procedure: LEFT HEART CATH AND CORS/GRAFTS ANGIOGRAPHY;  Surgeon: Larey Dresser, MD;  Location: [facility];  Service: Cardiovascular;  Laterality: N/A;   Family History:  Family History  Problem Relation Age of Onset  . CAD Father   . Colon cancer Father   . Diabetes Brother    Social History:  Patient lives at home with wife. Denies cigarette, chewing tobacco, illicit drug, and alcohol use.  Review of Systems: A complete ROS was negative except as per HPI.   Physical Exam: Blood pressure (!) 147/63, pulse 63, temperature 99.2 F (37.3 C), temperature source Rectal, resp. rate 13, SpO2 97 %.  Physical Exam  Constitutional:  Middle aged gentleman sitting comfortably in bed in no acute distress. Mildly diaphoretic.  HENT:  Mouth/Throat: Oropharynx is clear and moist.  Neck: No JVD present.  Cardiovascular: Normal rate and regular rhythm.  No murmur heard. Well healed vertical surgical scar over sternum. 2+ radial pulses and posterior tibial pulses bilaterally.  Respiratory: Effort normal. No respiratory distress. He has no wheezes.  No crackles appreciated  GI: Soft. Bowel sounds are normal. He exhibits no distension. There is tenderness (mid epigastric tenderness). There is no rebound and no guarding.  Musculoskeletal: He exhibits tenderness (with palpation of L foot during exam; no tenderness with palpation of R foot). He exhibits no edema (of bilateral lower extremities).  Skin: Skin is warm and dry. No rash noted. No erythema.  Patient has erythema on the dorsal aspect of his L foot which tracks to the medial aspect of his ankle. Foot is tender to the touch but it does not exhibit increased warmth relative to the R foot.        EKG: personally reviewed my interpretation is TWI in V5-6, stable from previous EKG on 10/25. Mild ST elevation in V3, relative to EKG on 10/25 without changes in contiguous leads from prior exams.  CXR:  personally reviewed my interpretation is no evidence of pulmonary congestion or pulmonary effusion.   Assessment & Plan by Problem: Principal Problem:   Atypical chest pain Active Problems:   Cellulitis of foot, left  Kenneth [name] is 61 yo with PMH of CAD s/p CABGx3 with NSTEMI and DES placement in 06/2017, ESRD on HD MWF, [id number] complicated by neuropathy, HTN, diastolic CHF (EF 96% on 0454 echo), and GERD who presents for evaluation of atypical chest pain and wounds on plantar aspect of L  foot. In the ED, no changes concerning for ACS were observed on EKG and initial troponin was within normal limits. His physical exam was concerning for cellulitis of L foot and he was started on broad spectrum antibiotics. He was admitted to the internal medicine teaching service with cardiology and nephrology consulting. The specific problems addressed during admission were as follows:  Atypical chest pain: Patient's pain is epigastric/lower sternal in nature. It is described as increased pressure or chest tightening. It appears that this pain has occurred on multiple occassions over the past month. It may be associated with nausea and diaphoresis. It does not sound cardiac in nature, however, because it is not associated with exercise, relieved by nitroglycerin or rest, and has occurred intermittently on multiple occassions without clear provocation. The hypothesis that this chest pain is non-cardiac in nature is supported by the patient's initial EKG that is relatively unchanged from prior EKGs and initial troponin within normal limits. Given extensive cardiac history, recent graft revascularization in 06/2017, and HEART score of 4 indicating moderate MACE risk, however, will admit for ACS rule out. It is possible that the patient's chest pain is related to his GERD, as the patient had tenderness with palpation of epigastric region. Could consider increasing home protonix to BID if ACS ruled out with plan  below. -Cardiology consulted by ED, recommendations appreciated -Trend troponin overnight (q6 hours x3 readings) -Repeat EKG in AM -Telemetry while inpatient -Consider GERD medication adjustment  L foot cellulitis: Patient s/p vancomycin and zosyn in the ED. Patient's history of worsening redness and tenderness on physical exam is consistent with cellulitis. Patient hemodynamically stable and afebrile, does not require IV antibiotics at this time. The patient will be transitioned to oral Keflex tomorrow morning, since he has already received IV vancomycin and zosyn.  -Keflex 500 mg 4 times a day x 4 days (5 days total duration) -Tylenol and naproxen for pain management -Wound care consult -Patient will require outpatient podiatry follow up  ESRD on HD MWF: Patient does not have current signs or symptoms of volume overload on exam. Patient was scheduled for dialysis today, but did not complete session 2/2 chest pain. Will consult nephrology for possible dialysis today. -Nephrology consulted in ED, recommendations appreciated  Hx of CAD s/p CABG x3 with DES placement in graft on 06/2017: -Cardiology consulted, as above -Continue home atorvastatin 80 mg, aspirin 81 mg, clopidogrel 75 mg, metoprolol XL 25 mg and ezetimibe 10 mg  HTN, diastolic CHF (EF 93% on 8101 echo): No current signs of acute HF decompensation, will continue to monitor. -Continue home Imdur 30 mg, hydralazine 25 mg BID, losartan 50 mg daily, metoprolol XL 25 mg daily  [id number] complicated by neuropathy: Last A1C = 5.8% on 04/12/2017, indicating problem well controlled at baseline. -Continue home gabapentin 100 mg daily at bedtime -Continue home insulin pump while inpatient  FEN/GI: -Heart healthy diet -No IVF, replace electrolytes as needed -Pantoprazole 40 mg daily  VTE Prophylaxis: SCD's, ambulate when tolerated  Code Status: Full  Dispo: Admit patient to Observation with expected length of stay less than 2  midnights.  SignedThomasene Ripple, MD 09/12/2017, 1:30 PM  Pager: [phone]

## 2017-09-12 NOTE — ED Provider Notes (Addendum)
[facility] EMERGENCY DEPARTMENT Provider Note   CSN: [id number] Arrival date & time: 09/12/17  0911     History   Chief Complaint Chief Complaint  Patient presents with  . Chest Pain    HPI Kenneth [name] is a 61 y.o. male.  HPI  61 year old male history of end-stage renal disease with dialysis Monday Wednesday Friday, history of coronary artery disease status post CABG, diabetes mellitus presents today complaining of chest pain.  He reports being at dialysis today when he began having substernal chest pain approximately 1 hour ago.  He has received 2 nitroglycerin, aspirin, and 6 of morphine and continues to complain of severe pain.  He states that it radiates into his left arm.  He does not endorse any associated symptoms.  He states that this is similar to prior cardiac pain.  He is complaining of pain in his left foot.  He states that he was seen for this yesterday.  He states he has a wound there.  He endorses that there was increasing erythema and pain of the left foot. S/p stent 07/07/17- Dr. Aundra Dubin  Past Medical History:  Diagnosis Date  . Acute pulmonary edema ([place]) 05/31/2016  . Acute respiratory failure with hypoxemia ([place]) 05/31/2016  . CHF (congestive heart failure) ([place])   . CRF (chronic renal failure), stage 5 ([place])   . 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 ESRD (end stage renal disease) on dialysis [facility])    "MWF; [place]" (01/18/2017)  . HCAP (healthcare-associated pneumonia)   . Hemodialysis status ([place])   . Ischemic rest pain of lower extremity ([place]) 02/06/2015  . 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 NSTEMI (non-ST elevated myocardial infarction) ([place])   . Pain in lower limb 02/06/2015  . Pain in the chest   . Pleural effusion   . 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 Shortness of breath     Patient Active Problem List   Diagnosis Date Noted  . Hyperkalemia 07/20/2017  . Non-ST elevated myocardial  infarction ([place]) 07/05/2017  . Severe comorbid illness   . Chronic diastolic CHF (congestive heart failure) ([place]) 04/14/2017  . Hypertensive emergency 04/14/2017  . Syncope 04/13/2017  . Anemia, chronic renal failure 04/11/2017  . Volume overload 04/11/2017  . Diabetic foot ulcer ([place]) 04/11/2017  . Hypertensive crisis   . Cellulitis in diabetic foot ([place]) 01/18/2017  . Chest pain 12/16/2016  . Abdominal pain 12/16/2016  . Age-related nuclear cataract of right eye 12/09/2016  . Pressure injury of skin 11/03/2016  . Diabetes mellitus with complication ([place]) 30/05/2329  . Elevated troponin I level 10/31/2016  . ESRD (end stage renal disease) ([place]) 10/30/2016  . CAD (coronary artery disease) 10/30/2016  . Gastroesophageal reflux disease 08/18/2016  . 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 CHF (congestive heart failure), NYHA class I, chronic, systolic ([place])   . Bursitis of left shoulder 05/31/2016  . 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 Ischemic rest pain of lower extremity ([place]) 02/06/2015    Past Surgical History:  Procedure Laterality Date  . AV FISTULA PLACEMENT Left 06/22/2016   Procedure: ARTERIOVENOUS (AV) FISTULA CREATION LEFT UPPER ARM;  Surgeon: Rosetta Posner, MD;  Location: [facility];  Service: Vascular;  Laterality: Left;  . CARDIAC CATHETERIZATION N/A 05/31/2016   Procedure: Left Heart Cath and Coronary Angiography;  Surgeon: Jettie Booze, MD;  Location: [place] CV  LAB;  Service: Cardiovascular;  Laterality: N/A;  . CARDIAC CATHETERIZATION N/A 05/31/2016   Procedure: Right Heart Cath;  Surgeon: Jettie Booze, MD;  Location: [place] CV LAB;  Service: Cardiovascular;  Laterality: N/A;  . CARDIAC CATHETERIZATION N/A 05/31/2016   Procedure: IABP Insertion;  Surgeon: Jettie Booze, MD;  Location: [place] CV LAB;  Service: Cardiovascular;  Laterality: N/A;  . CORONARY ARTERY  BYPASS GRAFT N/A 06/05/2016   Procedure: CORONARY ARTERY BYPASS GRAFTING (CABG) x3 LIMA to LAD -SVG to OM -SVG to RCA;  Surgeon: Ivin Poot, MD;  Location: [place];  Service: Open Heart Surgery;  Laterality: N/A;  . CORONARY STENT INTERVENTION N/A 07/07/2017   Procedure: CORONARY STENT INTERVENTION;  Surgeon: Wellington Hampshire, MD;  Location: [place] CV LAB;  Service: Cardiovascular;  Laterality: N/A;  . INSERTION OF DIALYSIS CATHETER N/A 06/05/2016   Procedure: INSERTION OF DIALYSIS/trialysis CATHETER;  Surgeon: Ivin Poot, MD;  Location: [place];  Service: Vascular;  Laterality: N/A;  . INSERTION OF DIALYSIS CATHETER Right 06/13/2016   Procedure: INSERTION OF DIALYSIS CATHETER RIGHT INTERNAL JUGULAR;  Surgeon: Conrad [place], MD;  Location: [place];  Service: Vascular;  Laterality: Right;  . INTRAOPERATIVE TRANSESOPHAGEAL ECHOCARDIOGRAM N/A 06/05/2016   Procedure: INTRAOPERATIVE TRANSESOPHAGEAL ECHOCARDIOGRAM;  Surgeon: Ivin Poot, MD;  Location: [place];  Service: Open Heart Surgery;  Laterality: N/A;  . LEFT HEART CATH AND CORS/GRAFTS ANGIOGRAPHY N/A 11/03/2016   Procedure: Left Heart Cath and Cors/Grafts Angiography;  Surgeon: Troy Sine, MD;  Location: [place] CV LAB;  Service: Cardiovascular;  Laterality: N/A;  . LEFT HEART CATH AND CORS/GRAFTS ANGIOGRAPHY N/A 07/07/2017   Procedure: LEFT HEART CATH AND CORS/GRAFTS ANGIOGRAPHY;  Surgeon: Larey Dresser, MD;  Location: [place] CV LAB;  Service: Cardiovascular;  Laterality: N/A;       Home Medications    Prior to Admission medications   Medication Sig Start Date End Date Taking? Authorizing Provider  aspirin 81 MG chewable tablet Chew 1 tablet (81 mg total) by mouth daily. 11/06/16   Caren Griffins, MD  atorvastatin (LIPITOR) 80 MG tablet TAKE 1 TABLET BY MOUTH ONCE DAILY 08/10/17   Larey Dresser, MD  citalopram (CELEXA) 20 MG tablet Take 20 mg by mouth daily. 02/25/17   [provider]  clopidogrel  (PLAVIX) 75 MG tablet Take 1 tablet (75 mg total) by mouth daily. 01/12/17   Bensimhon, Shaune Pascal, MD  ezetimibe (ZETIA) 10 MG tablet Take 1 tablet (10 mg total) by mouth daily. 02/02/17   Larey Dresser, MD  gabapentin (NEURONTIN) 300 MG capsule Take 300 mg by mouth at bedtime. 02/11/17   [provider]  hydrALAZINE (APRESOLINE) 25 MG tablet Take 1 tablet (25 mg total) by mouth 2 (two) times daily. 07/08/17   Danford, Suann Larry, MD  Insulin Human (INSULIN PUMP) SOLN Inject into the skin continuous. NOVOLOG    [provider]  isosorbide mononitrate (IMDUR) 30 MG 24 hr tablet Take 1 tablet (30 mg total) by mouth daily. 04/14/17   Hongalgi, Lenis Dickinson, MD  losartan (COZAAR) 50 MG tablet Take 1 tablet (50 mg total) by mouth daily. 04/14/17   Hongalgi, Lenis Dickinson, MD  metoprolol succinate (TOPROL-XL) 25 MG 24 hr tablet Take 1 tablet (25 mg total) by mouth at bedtime. 04/13/17   Hongalgi, Lenis Dickinson, MD  pantoprazole (PROTONIX) 40 MG tablet Take 40 mg by mouth daily.  01/07/17   [provider]  Family History Family History  Problem Relation Age of Onset  . CAD Father   . Colon cancer Father   . Diabetes Brother     Social History Social History   Tobacco Use  . Smoking status: Former Research scientist (life sciences)  . Smokeless tobacco: Never Used  Substance Use Topics  . Alcohol use: No    Alcohol/week: 0.0 oz  . Drug use: No     Allergies   Patient has no known allergies.   Review of Systems Review of Systems  All other systems reviewed and are negative.    Physical Exam Updated Vital Signs There were no vitals taken for this visit.  Physical Exam  Constitutional: He is oriented to person, place, and time. He appears well-developed and well-nourished.  HENT:  Head: Normocephalic and atraumatic.  Eyes: EOM are normal. Pupils are equal, round, and reactive to light.  Neck: Normal range of motion. Neck supple.  Cardiovascular: Normal rate, regular rhythm and normal pulses.    Pulmonary/Chest: Effort normal and breath sounds normal.  Abdominal: Soft.  Musculoskeletal: Normal range of motion.  Left foot with erythema creeping up to mid calf.  There is diffuse tenderness to palpation.  Neurological: He is alert and oriented to person, place, and time.  Psychiatric: He has a normal mood and affect.  Nursing note and vitals reviewed.    ED Treatments / Results  Labs (all labs ordered are listed, but only abnormal results are displayed) Labs Reviewed  CULTURE, BLOOD (ROUTINE X 2)  CULTURE, BLOOD (ROUTINE X 2)  BASIC METABOLIC PANEL  CBC WITH DIFFERENTIAL/PLATELET  URINALYSIS, ROUTINE W REFLEX MICROSCOPIC  I-STAT TROPONIN, ED  I-STAT CG4 LACTIC ACID, ED    EKG  EKG Interpretation  Date/Time:  Sunday September 12 2017 09:12:45 EST Ventricular Rate:  77 PR Interval:    QRS Duration: 104 QT Interval:  411 QTC Calculation: 466 R Axis:   88 Text Interpretation:  Sinus rhythm Probable left atrial enlargement LVH with secondary repolarization abnormality Inferior infarct, old Anterior ST elevation, probably due to LVH Confirmed by Pattricia Boss [phone]) on 09/12/2017 9:19:44 AM       Radiology No results found.  Procedures Procedures (including critical care time)  Medications Ordered in ED Medications - No data to display   Initial Impression / Assessment and Plan / ED Course  I have reviewed the triage vital signs and the nursing notes.  Pertinent labs & imaging results that were available during my care of the patient were reviewed by me and considered in my medical decision making (see chart for details).     1-chest pain patient with known coronary artery disease, abnormal EKG but unchanged from prior, pain is resolved here after 2 sublingual nitroglycerin and aspirin.  First troponin is normal.  Will need trended troponins and cardiac monitoring 2 left foot with infection with associated cellulitis progressing up her leg.  She did have  blood cultures and antibiotics here in ED.  Lactic acid is normal.  Leukocytosis is noted with white blood cell count of 15,000 3 renal failure patient is normally dialyzed Monday Wednesday and Friday.  He is receiving early dialysis due to holiday week.  He did not receive full dialysis as I stopped after 3 minutes. 4- hyperkalemia patient with potassium of 5.6 here.  History receiving Kayexalate and Lasix.  He has no signs of hyperkalemia on evaluation. Discussed with Dr. Danford Bad, internal medicine teaching service and she will be in to evaluate.  Discussed with Dr.  Schertz and nephrology will consult Discussed with Dr. Acie Fredrickson and cardiology will consult Final Clinical Impressions(s) / ED Diagnoses   Final diagnoses:  Chest pain, unspecified type  Abnormal EKG  Diabetic foot infection ([place])  Cellulitis of left lower extremity  Hyperkalemia  ESRD (end stage renal disease) [facility])    ED Discharge Orders    None       Pattricia Boss, MD 09/12/17 1127    Pattricia Boss, MD 09/12/17 [phone]

## 2017-09-12 NOTE — Progress Notes (Signed)
Pharmacy Antibiotic Note  DONATELLO KLEVE is a 61 y.o. male admitted on 09/12/2017 from HD center with chest pain.  Pharmacy has been consulted for vancomycin dosing for wound infection.  Patient has a history of foot ulcer.  Patient receives HD on MWF, but received 3 minutes of dialysis today before presenting to the ED.  He is afebrile.   Plan: Vanc 1500mg  IV x 1, then 750mg  IV qHD MWF Monitor HD schedule, clinical progress, vanc level as indicated F/U with continuation of Gram negative coverage     No data recorded.  Recent Labs  Lab 09/12/17 0941  LATICACIDVEN 0.92    CrCl cannot be calculated (Patient's most recent lab result is older than the maximum 21 days allowed.).    No Known Allergies   Vanc 12/30 >> Zosyn  12/30 BCx -    Emi Lymon D. Mina Marble, PharmD, BCPS Pager:  [phone] 09/12/2017, 9:49 AM

## 2017-09-12 NOTE — Consult Note (Signed)
Cardiology Consult Note  Admit date: 09/12/2017 Name: Kenneth [name] 61 y.o.  [name] DOB:  04-Dec-1955 MRN:  [id number]  Today's date:  09/12/2017  Referring Physician:    Internal medicine teaching service  Reason for Consultation:    Chest pain  IMPRESSIONS: 1.  Prolonged chest pain occurring in the dialysis setting which is still ongoing but with negative troponins 2.  Hypertensive heart disease 3.  CAD with previous bypass grafting with known occlusion of the graft to the right coronary artery and recent drug-eluting stenting of an ostial stenosis of the saphenous vein graft to the marginal branch in October 4.  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  Lower extremity cellulitis with ulcerations on diabetic foot 6.  Hyperlipidemia under treatment 7.  Chronic diastolic dysfunction with prior history of systolic dysfunction that improved following revascularization  RECOMMENDATION: His EKG is very abnormal with previous LVH but is unchanged.  I do not think he is having an acute myocardial infarction.  Additional troponins are negative.  Would continue to cycle troponins at this time.  If he develops significant troponin elevation will likely require repeat catheterization in light of recent stenting.  He also will need treatment of his lower extremity cellulitis  HISTORY: This 61 year old Kenneth [name] has a long-standing history of diabetes hypertensive heart disease and chronic kidney disease.  He developed congestive heart failure and was found have three-vessel coronary artery disease and underwent bypass grafting in 2017.  He was later found to have occluded his vein graft to the right coronary artery and in October was admitted with a non-STEMI and was found to have a severe ostial stenosis of the vein graft to the marginal branch.  He was pain-free for about a month after this but has had recurrent discomfort since then sometimes with exertion.  He has been  followed in cardiac rehabilitation.  This past week he exercised intensively and developed some blisters and ulcerations on the bottom part of his left foot.  He later went to see a podiatrist and started treatment for significant ulcers on the toes of his left foot.  He was on an unusual dialysis schedule today because of the holidays and around 8 AM had the development of squeezing substernal tightness while on dialysis.  The dialysis session only lasted 3 minutes he was transported to the emergency room.  EKG showed LVH with significant ST changes but was unchanged from previous EKGs.  In addition initial troponin was normal.  He is currently complaining of some chest discomfort but appears comfortable otherwise.  He was found to have leukocytosis and evidence of cellulitis of his foot.  He denies PND orthopnea or edema.  Past Medical History:  Diagnosis Date  . Acute pulmonary edema ([place]) 05/31/2016  . Acute respiratory failure with hypoxemia ([place]) 05/31/2016  . CAD (coronary artery disease) 10/30/2016   NSTEMI 9/17 with CABG x 3 (LIMA-LAD, SVG-OM, SVG-RCA).   - NSTEMI 10/18 s/p DES to ostial SVG to  OM  . Diabetic foot ulcer ([place]) 04/11/2017  . 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 ESRD (end stage renal disease) on dialysis [facility])    "MWF; [place]" (01/18/2017)  . Gastroesophageal reflux disease 08/18/2016  . HCAP (healthcare-associated pneumonia)   . Hemodialysis status ([place])   . Hypertensive heart disease 09/12/2017  . Ischemic rest pain of lower extremity ([place]) 02/06/2015  . 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 NSTEMI (non-ST elevated myocardial infarction) ([place])   . Pleural  effusion   . Pronation deformity of both feet 02/27/2015     Past Surgical History:  Procedure Laterality Date  . AV FISTULA PLACEMENT Left 06/22/2016   Procedure: ARTERIOVENOUS (AV) FISTULA CREATION LEFT UPPER ARM;  Surgeon: Rosetta Posner, MD;  Location: [facility];  Service: Vascular;  Laterality:  Left;  . CARDIAC CATHETERIZATION N/A 05/31/2016   Procedure: Left Heart Cath and Coronary Angiography;  Surgeon: Jettie Booze, MD;  Location: [place] CV LAB;  Service: Cardiovascular;  Laterality: N/A;  . CARDIAC CATHETERIZATION N/A 05/31/2016   Procedure: Right Heart Cath;  Surgeon: Jettie Booze, MD;  Location: [place] CV LAB;  Service: Cardiovascular;  Laterality: N/A;  . CARDIAC CATHETERIZATION N/A 05/31/2016   Procedure: IABP Insertion;  Surgeon: Jettie Booze, MD;  Location: [place] CV LAB;  Service: Cardiovascular;  Laterality: N/A;  . CORONARY ARTERY BYPASS GRAFT N/A 06/05/2016   Procedure: CORONARY ARTERY BYPASS GRAFTING (CABG) x3 LIMA to LAD -SVG to OM -SVG to RCA;  Surgeon: Ivin Poot, MD;  Location: [place];  Service: Open Heart Surgery;  Laterality: N/A;  . CORONARY STENT INTERVENTION N/A 07/07/2017   Procedure: CORONARY STENT INTERVENTION;  Surgeon: Wellington Hampshire, MD;  Location: [place] CV LAB;  Service: Cardiovascular;  Laterality: N/A;  . INSERTION OF DIALYSIS CATHETER N/A 06/05/2016   Procedure: INSERTION OF DIALYSIS/trialysis CATHETER;  Surgeon: Ivin Poot, MD;  Location: [place];  Service: Vascular;  Laterality: N/A;  . INSERTION OF DIALYSIS CATHETER Right 06/13/2016   Procedure: INSERTION OF DIALYSIS CATHETER RIGHT INTERNAL JUGULAR;  Surgeon: Conrad Eastman, MD;  Location: [place];  Service: Vascular;  Laterality: Right;  . INTRAOPERATIVE TRANSESOPHAGEAL ECHOCARDIOGRAM N/A 06/05/2016   Procedure: INTRAOPERATIVE TRANSESOPHAGEAL ECHOCARDIOGRAM;  Surgeon: Ivin Poot, MD;  Location: [place];  Service: Open Heart Surgery;  Laterality: N/A;  . LEFT HEART CATH AND CORS/GRAFTS ANGIOGRAPHY N/A 11/03/2016   Procedure: Left Heart Cath and Cors/Grafts Angiography;  Surgeon: Troy Sine, MD;  Location: [place] CV LAB;  Service: Cardiovascular;  Laterality: N/A;  . LEFT HEART CATH AND CORS/GRAFTS ANGIOGRAPHY N/A 07/07/2017   Procedure: LEFT HEART  CATH AND CORS/GRAFTS ANGIOGRAPHY;  Surgeon: Larey Dresser, MD;  Location: [place] CV LAB;  Service: Cardiovascular;  Laterality: N/A;    Allergies:  has No Known Allergies.   Medications: Prior to Admission medications   Medication Sig Start Date End Date Taking? Authorizing Provider  acetaminophen (TYLENOL) 500 MG tablet Take 500 mg by mouth every 6 (six) hours as needed for mild pain.   Yes [provider]  aspirin 81 MG chewable tablet Chew 1 tablet (81 mg total) by mouth daily. 11/06/16  Yes Caren Griffins, MD  atorvastatin (LIPITOR) 80 MG tablet TAKE 1 TABLET BY MOUTH ONCE DAILY 08/10/17  Yes Larey Dresser, MD  calcium acetate (PHOSLO) 667 MG capsule Take 667 mg by mouth 3 (three) times daily with meals.  07/09/17  Yes [provider]  citalopram (CELEXA) 20 MG tablet Take 20 mg by mouth daily. 02/25/17  Yes [provider]  clopidogrel (PLAVIX) 75 MG tablet Take 1 tablet (75 mg total) by mouth daily. 01/12/17  Yes Bensimhon, Shaune Pascal, MD  ezetimibe (ZETIA) 10 MG tablet Take 1 tablet (10 mg total) by mouth daily. 02/02/17  Yes Larey Dresser, MD  gabapentin (NEURONTIN) 100 MG capsule Take 100 mg by mouth at bedtime.  02/11/17  Yes [provider]  hydrALAZINE (APRESOLINE) 25 MG  tablet Take 1 tablet (25 mg total) by mouth 2 (two) times daily. 07/08/17  Yes Danford, Suann Larry, MD  Insulin Human (INSULIN PUMP) SOLN Inject into the skin continuous. NOVOLOG   Yes [provider]  isosorbide mononitrate (IMDUR) 30 MG 24 hr tablet Take 1 tablet (30 mg total) by mouth daily. 04/14/17  Yes Hongalgi, Lenis Dickinson, MD  losartan (COZAAR) 50 MG tablet Take 1 tablet (50 mg total) by mouth daily. 04/14/17  Yes Hongalgi, Lenis Dickinson, MD  metoprolol succinate (TOPROL-XL) 25 MG 24 hr tablet Take 1 tablet (25 mg total) by mouth at bedtime. 04/13/17  Yes Hongalgi, Lenis Dickinson, MD  pantoprazole (PROTONIX) 40 MG tablet Take 40 mg by mouth daily.  01/07/17  Yes [provider]  Propylene Glycol (SYSTANE BALANCE) 0.6 % SOLN Place 1 drop into both eyes as needed (dry eyes).   Yes [provider]   Family History: Family Status  Relation Name Status  . Father  (Not Specified)  . Brother  (Not Specified)    Social History:   reports that he has quit smoking. he has never used smokeless tobacco. He reports that he does not drink alcohol or use drugs.   Review of Systems: Significant numbness of both feet bilaterally.  No nausea.  No significant shortness of breath.  Other than as noted above remainder of the review of systems is unremarkable.  Physical Exam: BP (!) 147/63   Pulse 63   Temp 99.2 F (37.3 C) (Rectal)   Resp 13   SpO2 97%   General appearance: He is a pleasant Kenneth [name] lying in the bed and currently in no acute distress Head: Normocephalic, without obvious abnormality, atraumatic Eyes: conjunctivae/corneas clear. PERRL, EOM's intact. Fundi benign. Neck: no adenopathy, no JVD, supple, symmetrical, trachea midline and Soft left carotid bruit noted Lungs: clear to auscultation bilaterally Heart: regular rate and rhythm, S1, S2 normal, no murmur, click, rub or gallop Abdomen: soft, non-tender; bowel sounds normal; no masses,  no organomegaly Rectal: deferred Extremities: Dialysis fistula present in the left arm, and no edema noted, area of erythema on the dorsum of the left foot, significant ulcers on the base of the toes of the left foot is noted in picture on admission note Pulses: 2+ and symmetric Skin: Ulceration present on the toes of the left foot, no other lesions noted Neurologic: Grossly normal Psych: Alert and oriented x 3 Labs: CBC Recent Labs    09/12/17 0930  WBC 15.0*  RBC 3.35*  HGB 10.2*  HCT 30.9*  PLT 139*  MCV 92.2  MCH 30.4  MCHC 33.0  RDW 14.0  LYMPHSABS 2.0  MONOABS 1.2*  EOSABS 0.2  BASOSABS 0.0   CMP  Recent Labs    09/12/17 0920  NA 132*  K 5.6*  CL 90*  CO2 27  GLUCOSE  167*  BUN 34*  CREATININE 7.19*  CALCIUM 8.9  GFRNONAA 7*  GFRAA 9*   BNP (last 3 results) BNP    Component Value Date/Time   BNP 1,724.5 (H) 04/11/2017 1529   Cardiac Panel (last 3 results) Troponin (Point of Care Test) Recent Labs    09/12/17 0939  TROPIPOC 0.03    Radiology:  No x-rays done of chest on admission  EKG: Sinus rhythm with LVH with ST and T-wave changes. Independently reviewed by me  Signed:  W. Doristine Church MD [facility]   Cardiology Consultant  09/12/2017, 2:53 PM

## 2017-09-12 NOTE — ED Notes (Signed)
ptg sleeping soundly  Wife at the bedside

## 2017-09-13 ENCOUNTER — Encounter (HOSPITAL_COMMUNITY): Payer: Medicare Other

## 2017-09-13 DIAGNOSIS — E875 Hyperkalemia: Secondary | ICD-10-CM

## 2017-09-13 DIAGNOSIS — R072 Precordial pain: Secondary | ICD-10-CM

## 2017-09-13 DIAGNOSIS — L03116 Cellulitis of left lower limb: Secondary | ICD-10-CM | POA: Diagnosis not present

## 2017-09-13 DIAGNOSIS — N186 End stage renal disease: Secondary | ICD-10-CM | POA: Diagnosis not present

## 2017-09-13 DIAGNOSIS — R0789 Other chest pain: Secondary | ICD-10-CM | POA: Diagnosis not present

## 2017-09-13 DIAGNOSIS — I222 Subsequent non-ST elevation (NSTEMI) myocardial infarction: Secondary | ICD-10-CM | POA: Diagnosis not present

## 2017-09-13 LAB — GLUCOSE, CAPILLARY
GLUCOSE-CAPILLARY: 116 mg/dL — AB (ref 65–99)
GLUCOSE-CAPILLARY: 175 mg/dL — AB (ref 65–99)

## 2017-09-13 LAB — CBC
HCT: 30.6 % — ABNORMAL LOW (ref 39.0–52.0)
HEMOGLOBIN: 10 g/dL — AB (ref 13.0–17.0)
MCH: 30.5 pg (ref 26.0–34.0)
MCHC: 32.7 g/dL (ref 30.0–36.0)
MCV: 93.3 fL (ref 78.0–100.0)
Platelets: 120 10*3/uL — ABNORMAL LOW (ref 150–400)
RBC: 3.28 MIL/uL — AB (ref 4.22–5.81)
RDW: 14 % (ref 11.5–15.5)
WBC: 9.2 10*3/uL (ref 4.0–10.5)

## 2017-09-13 LAB — BASIC METABOLIC PANEL
ANION GAP: 13 (ref 5–15)
BUN: 44 mg/dL — ABNORMAL HIGH (ref 6–20)
CALCIUM: 8.5 mg/dL — AB (ref 8.9–10.3)
CO2: 27 mmol/L (ref 22–32)
Chloride: 93 mmol/L — ABNORMAL LOW (ref 101–111)
Creatinine, Ser: 8.62 mg/dL — ABNORMAL HIGH (ref 0.61–1.24)
GFR, EST AFRICAN AMERICAN: 7 mL/min — AB (ref >60)
GFR, EST NON AFRICAN AMERICAN: 6 mL/min — AB (ref >60)
Glucose, Bld: 135 mg/dL — ABNORMAL HIGH (ref 65–99)
Potassium: 5.2 mmol/L — ABNORMAL HIGH (ref 3.5–5.1)
Sodium: 133 mmol/L — ABNORMAL LOW (ref 135–145)

## 2017-09-13 LAB — TROPONIN I: TROPONIN I: 0.03 ng/mL — AB (ref <0.03)

## 2017-09-13 MED ORDER — NAPROXEN 500 MG PO TABS: 500.0000 mg | ORAL_TABLET | Freq: Two times a day (BID) | ORAL | 0 refills | Status: DC

## 2017-09-13 MED ORDER — NAPROXEN 500 MG PO TABS: 500.0000 mg | ORAL_TABLET | Freq: Two times a day (BID) | ORAL | 0 refills | Status: DC | PRN

## 2017-09-13 MED ORDER — CEPHALEXIN 500 MG PO CAPS: 500.0000 mg | ORAL_CAPSULE | Freq: Every day | ORAL | 0 refills | Status: AC

## 2017-09-13 MED ORDER — DOXERCALCIFEROL 4 MCG/2ML IV SOLN
INTRAVENOUS | Status: AC
  Filled 2017-09-13: qty 2

## 2017-09-13 NOTE — Discharge Summary (Signed)
Name: Kenneth [name] MRN: [id number] DOB: 12/09/1955 61 y.o. PCP: [name], MD  Date of Admission: 09/12/2017  9:11 AM Date of Discharge: 09/13/2017 Attending Physician: Annia Belt, MD  Discharge Diagnosis: Atypical Chest Pain Left Foot Cellulitis  Discharge Medications: Allergies as of 09/13/2017   No Known Allergies     Medication List    TAKE these medications   acetaminophen 500 MG tablet Commonly known as:  TYLENOL Take 500 mg by mouth every 6 (six) hours as needed for mild pain.   aspirin 81 MG chewable tablet Chew 1 tablet (81 mg total) by mouth daily.   atorvastatin 80 MG tablet Commonly known as:  LIPITOR TAKE 1 TABLET BY MOUTH ONCE DAILY   calcium acetate 667 MG capsule Commonly known as:  PHOSLO Take 667 mg by mouth 3 (three) times daily with meals.   cephALEXin 500 MG capsule Commonly known as:  KEFLEX Take 1 capsule (500 mg total) by mouth at bedtime for 6 days.   citalopram 20 MG tablet Commonly known as:  CELEXA Take 20 mg by mouth daily.   clopidogrel 75 MG tablet Commonly known as:  PLAVIX Take 1 tablet (75 mg total) by mouth daily.   ezetimibe 10 MG tablet Commonly known as:  ZETIA Take 1 tablet (10 mg total) by mouth daily.   gabapentin 100 MG capsule Commonly known as:  NEURONTIN Take 100 mg by mouth at bedtime.   hydrALAZINE 25 MG tablet Commonly known as:  APRESOLINE Take 1 tablet (25 mg total) by mouth 2 (two) times daily.   insulin pump Soln Inject into the skin continuous. NOVOLOG   isosorbide mononitrate 30 MG 24 hr tablet Commonly known as:  IMDUR Take 1 tablet (30 mg total) by mouth daily.   losartan 50 MG tablet Commonly known as:  COZAAR Take 1 tablet (50 mg total) by mouth daily.   metoprolol succinate 25 MG 24 hr tablet Commonly known as:  TOPROL-XL Take 1 tablet (25 mg total) by mouth at bedtime.   naproxen 500 MG tablet Commonly known as:  NAPROSYN Take 1 tablet (500 mg total) by mouth 2  (two) times daily as needed for mild pain or moderate pain. Please take this medication with meals, as it can upset your stomach.   pantoprazole 40 MG tablet Commonly known as:  PROTONIX Take 40 mg by mouth daily.   SYSTANE BALANCE 0.6 % Soln Generic drug:  Propylene Glycol Place 1 drop into both eyes as needed (dry eyes).      Disposition and follow-up:   Kenneth [name] was discharged from [facility] in Good condition.  At the hospital follow up visit please address:  1.  Patient admitted for evaluation of atypical chest pain. His workup was negative for ACS, as his troponin levels were within normal limits and EKG's were unchanged. Cardiology consult suggested up titration of daily IMDUR dose as BP allows, please assess chest pain and determine if dose adjustments can be made.   Patient also had L foot cellulitis secondary to multiple, open friction blisters on the plantar aspect of left foot. He was treated with 7 days of antibiotic therapy and discharged with wound care instructions. Please assess wound healing and signs/symptoms of infection  2.  Labs / imaging needed at time of follow-up: None  3.  Pending labs/ test needing follow-up: None  Follow-up Appointments: Follow-up Information    [name], MD. Schedule an appointment as  soon as possible for a visit in 1 week(s).   Specialty:  Family Medicine Why:  Please follow up with this physician regarding your L foot wound. Contact information: [place] [address] [phone]        Larey Dresser, MD. Schedule an appointment as soon as possible for a visit in 2 week(s).   Specialty:  Cardiology Why:  For follow up of your chest pain Contact information: 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 He was admitted to the internal medicine teaching service with cardiology and nephrology consulting. The specific problems addressed during admission were as follows:  Atypical chest pain: The patient presented with a 1 month history of intermittent epigastric/substernal chest pain. It was described as increased pressure or chest tightening that intermittently occurs at rest and with exertion. It did not radiate to his jaw or arm, but was associated with nausea and diaphoresis. His initial EKG did not show ST elevation. While the chest pain did not sound cardiac in nature, the patient was admitted for overnight observation and ACS rule out given his extensive cardiac history, recent history NSTEMI 2/2 graft revascularization in 06/2017, and HEART score of 4 indicating moderate MACE risk. Overnight his troponin levels remained within normal limits and his repeat EKG was unchanged. Cardiology consult thought the patient's overall clinical picture and labs were reassuring that his chest pain was not a result of ACS. They suggesting up titration of his IMDUR, as this medication can help alleviate the type of chest pain he describes. Please consider this at follow up as BP allows.   Left foot cellulitis: Patient presented with a 4 day history of multiple blisters on plantar aspect of left feet which were draining serosanguinous fluid at the time of evaluation. [name] blisters started after exercising at cardiac rehabilitation. At the time of presentation he had a 24 hour history of worsening redness and tenderness in his left foot. His physical exam was most consistent with cellulitis. He had left foot x rays in the ED which did not show evidence of  cellulitis. The patient was afebrile and hemodynamically stable throughout admission. The patient was given a dose of vancomycin and zosyn in ED. His mild leukocytosis present on admission decreased with this intervention. His physical exam significantly improve overnight as well, noting interval decrease in erythema, swelling, and tenderness of lower extremity. Patient was discharged with a 6 day course of Keflex 500 mg to take daily at bedtime, per recommendations from our clinical pharmacist.  ESRD on HD MWF: As stated above, patient did not complete dialysis as planned on 09/12/2017 as a result of acute onset chest pain. Nephrology was consulted and they supervised his dialysis session on 09/13/2017. The patient was discharged with instructions to follow up as an outpatient as previously scheduled.   Discharge Vitals:   BP (!) 161/59   Pulse (!) 58   Temp 97.8 F (36.6 C) (Oral)   Resp 20   Ht 5\' 8"  (1.727 m)  Wt 150 lb 12.7 oz (68.4 kg)   SpO2 96%   BMI 22.93 kg/m   Pertinent Labs, Studies, and Procedures:   BMP Latest Ref Rng & Units 09/13/2017 09/12/2017 07/20/2017  Glucose 65 - 99 mg/dL 135(H) 167(H) 198(H)  BUN 6 - 20 mg/dL 44(H) 34(H) 24(H)  Creatinine 0.61 - 1.24 mg/dL 8.62(H) 7.19(H) 5.67(H)  Sodium 135 - 145 mmol/L 133(L) 132(L) 135  Potassium 3.5 - 5.1 mmol/L 5.2(H) 5.6(H) 5.2(H)  Chloride 101 - 111 mmol/L 93(L) 90(L) 94(L)  CO2 22 - 32 mmol/L 27 27 30   Calcium 8.9 - 10.3 mg/dL 8.5(L) 8.9 8.8(L)   CBC Latest Ref Rng & Units 09/13/2017 09/12/2017 07/08/2017  WBC 4.0 - 10.5 K/uL 9.2 15.0(H) 5.7  Hemoglobin 13.0 - 17.0 g/dL 10.0(L) 10.2(L) 9.5(L)  Hematocrit 39.0 - 52.0 % 30.6(L) 30.9(L) 29.5(L)  Platelets 150 - 400 K/uL 120(L) 139(L) 118(L)   Troponin (q6 hours x3 occurences) = 0.04, 0.03, 0.03  Left Foot X ray, 2 view: FINDINGS: There is soft tissue irregularity and ulceration at the plantar bases of the toes. No subcutaneous emphysema. No osteolysis or cortical  irregularity. No fracture. Joint spaces are relatively preserved. The bones are mildly osteopenic.  IMPRESSION: 1. Soft tissue irregularity and ulceration at the plantar bases of the toes. No acute osseous abnormality.   Discharge Instructions: Discharge Instructions    Call MD for:  persistant nausea and vomiting   Complete by:  As directed    Call MD for:  redness, tenderness, or signs of infection (pain, swelling, redness, odor or green/yellow discharge around incision site)   Complete by:  As directed    Call MD for:  temperature >100.4   Complete by:  As directed    Diet - low sodium heart healthy   Complete by:  As directed    Discharge instructions   Complete by:  As directed    You were evaluated for chest pain and a skin infection on your left foot.   The workup of your chest pain was reassuring and did not suggest that you had another heart attack. Please follow up with your cardiologist after leaving the hospital. You may benefit from increasing your dose of daily Imdur, as this medication can lower blood pressure and treat the type of chest pain that you have. Please discuss this matter with him at your hospital follow up visit.   For your foot infection, please take the antibiotic cephalexin for the next 6 days. You will have to take 1 pill every night at bedtime for a total of 6 days. Please dress your wound on your left foot as instructed by the wound care nurse in the hospital. Please follow up with your primary care physician after leaving the hospital regarding your left foot wound. You were referred to podiatry by the emergency department during your last hospital visit. Please follow up with them as instructed during that visit.   Increase activity slowly   Complete by:  As directed       Signed: Thomasene Ripple, MD 09/13/2017, 2:11 PM   Pager: [phone]

## 2017-09-13 NOTE — Procedures (Signed)
I was present at this dialysis session. I have reviewed the session itself and made appropriate changes.   Filed Weights   09/12/17 1619  Weight: 77.1 kg (169 lb 15.6 oz)    Recent Labs  Lab 09/13/17 0341  NA 133*  K 5.2*  CL 93*  CO2 27  GLUCOSE 135*  BUN 44*  CREATININE 8.62*  CALCIUM 8.5*    Recent Labs  Lab 09/12/17 0930 09/13/17 0341  WBC 15.0* 9.2  NEUTROABS 11.6*  --   HGB 10.2* 10.0*  HCT 30.9* 30.6*  MCV 92.2 93.3  PLT 139* 120*    Scheduled Meds: . aspirin  81 mg Oral Daily  . atorvastatin  80 mg Oral QHS  . calcium acetate  667 mg Oral TID WC  . citalopram  20 mg Oral Daily  . clopidogrel  75 mg Oral Daily  . doxercalciferol  2 mcg Intravenous Q M,W,F-HD  . ezetimibe  10 mg Oral Daily  . gabapentin  100 mg Oral QHS  . hydrALAZINE  25 mg Oral BID  . insulin aspart  0-9 Units Subcutaneous TID WC  . isosorbide mononitrate  30 mg Oral Daily  . losartan  50 mg Oral Daily  . metoprolol succinate  25 mg Oral QHS  . naproxen  500 mg Oral BID WC  . pantoprazole  40 mg Oral Daily   Continuous Infusions: PRN Meds:.acetaminophen **OR** acetaminophen   Donetta Potts,  MD 09/13/2017, 9:09 AM

## 2017-09-13 NOTE — Progress Notes (Signed)
Subjective: Patient seen laying comfortably in bed during dialysis. Patient states that he feels better today. Patient thinks his foot looks better today too. NO acute complaints.  Objective:  Vital signs in last 24 hours: Vitals:   09/13/17 1000 09/13/17 1030 09/13/17 1100 09/13/17 1158  BP: (!) 154/72 (!) 155/55 (!) 121/31 (!) 161/59  Pulse: (!) 57 (!) 58 60 (!) 58  Resp: 13 20 16 20   Temp:    97.8 F (36.6 C)  TempSrc:    Oral  SpO2:    96%  Weight:      Height:       Physical Exam  Constitutional: He appears well-developed and well-nourished. No distress.  HENT:  Mouth/Throat: Oropharynx is clear and moist.  Cardiovascular: Normal rate and regular rhythm. Exam reveals no friction rub.  No murmur heard. Respiratory: Effort normal and breath sounds normal. No respiratory distress. He has no wheezes.  No crackles appreciated  Musculoskeletal: He exhibits no edema (of bilateral lower extremities) or tenderness (of bilateral lower extremities).  Skin: Skin is warm and dry. He is not diaphoretic.  L foot wound wrapped in dry kerlex, which was removed for exam. Some serosanguinous drainage on wrapping. Interval improvement in erythema of L lower extremity from yesterday's exam. NO tenderness with palpation noted.   Assessment/Plan:  Active Problems:   Hypertensive heart disease   Hyperlipidemia  Kenneth [name] is 61 yo with PMH of CAD s/p CABGx3 with NSTEMI and DES placement in 06/2017, ESRD on HD MWF, L9JT complicated by neuropathy, HTN, diastolic CHF (EF 70% on 1779 echo), and GERD who presents for evaluation of atypical chest pain and wounds on plantar aspect of L foot. He was admitted to the internal medicine teaching service with cardiology and nephrology consulting. The specific problems addressed during admission were as follows:  Atypical chest pain: Patient's cardiac workup reassuring that chest pain not a result of ACS, as overnight troponin levels within normal limits for  ESRD and no changes observed on EKG. Per cardiology consult patient may need up titration of Imdur for his occasional chest pain symptoms. Patient stable and amenable to discharge today.  -Cardiology consulted and appreciatedinpatient -Consider GERD medication adjustment and Imdur medication adjustment as an outpatient  L foot cellulitis: Patient's physical exam shows interval improvement from yesterday. WBC's downtrending. Patient has remained afebrile. Will discharge with antibiotics and outpatient follow up with podiatry as previously discussed at last ED visit. Discussed antibiotics with clinical pharmacist and will plan for 500 mg Keflex each night for a total of 6 more days of treatment.   -Keflex 500 mg qhs x 6 days (total 7 days treatment) -Tylenol and naproxen for pain management -Wound care consult -Patient will require outpatient podiatry follow up  ESRD on HD MWF:  -Patient had dialysis today, will be discharged on regular schedule -Nephrology consulted, assistance appreciated  Hx of CAD s/p CABG x3 with DES placement in graft on 06/2017: -Continue home atorvastatin 80 mg, aspirin 81 mg, clopidogrel 75 mg, and ezetimibe 10 mg  HTN, diastolic CHF (EF 39% on 0300 echo): Patient normotensive-hypertensive after dialysis today. Patient has room to go up on his Imdur, which may help with chest pain and BP. Will recommend to outpatient cardiologist for this change to be made as I am reluctant to make changes while inpatient.  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  Thomasene Ripple, MD 09/13/2017, 12:45  PM Pager: [phone]

## 2017-09-13 NOTE — Consult Note (Addendum)
[facility] Nurse wound consult note Reason for Consult: Consult requested for right foot and toes. Pt previously had cellulitis and blisters, which have evolved into full thickness tissue loss. Previously marked area of erythemia and edema has receded to anterior foot and leg. Wound type: Left great toe; 3.5X3X.2cm, dry red woundbed 2nd toe 2.5X.5X.1cm, dry red wound bed with scab adhered 3rd toe 3.8X.8X.2cm, dry red wound bed with scab adhered 4th toe, 1X1.3X.2cm, red moist wound bed 5th toe extending to left outer plantar foot; 2.5X3X.2cm red moist woundbed and macerated loose peeling skin surrounding Drainage (amount, consistency, odor) Mod amt tan drainage, no odor Dressing procedure/placement/frequency: Xerform gauze to promote moist healing.  Discussed plan of care with patient and he verbalized understanding. Please re-consult if further assistance is needed.  Thank-you,  Julien Girt MSN, [place], [place], [place], [place]

## 2017-09-13 NOTE — Progress Notes (Signed)
Patient has sleep apnea and snores.

## 2017-09-13 NOTE — Progress Notes (Signed)
Progress Note  Patient Name: Kenneth [name] Date of Encounter: 09/13/2017  Primary Cardiologist: Dr. Aundra Dubin  Subjective   Pt continues to complain of constant twisting chest pain, similar to what brought him to the hospital. He also has chest congestion with a productive cough.  Inpatient Medications    Scheduled Meds: . aspirin  81 mg Oral Daily  . atorvastatin  80 mg Oral QHS  . calcium acetate  667 mg Oral TID WC  . citalopram  20 mg Oral Daily  . clopidogrel  75 mg Oral Daily  . doxercalciferol  2 mcg Intravenous Q M,W,F-HD  . ezetimibe  10 mg Oral Daily  . gabapentin  100 mg Oral QHS  . hydrALAZINE  25 mg Oral BID  . insulin aspart  0-9 Units Subcutaneous TID WC  . isosorbide mononitrate  30 mg Oral Daily  . losartan  50 mg Oral Daily  . metoprolol succinate  25 mg Oral QHS  . naproxen  500 mg Oral BID WC  . pantoprazole  40 mg Oral Daily   Continuous Infusions:  PRN Meds: acetaminophen **OR** acetaminophen   Vital Signs    Vitals:   09/12/17 2105 09/13/17 0018 09/13/17 0104 09/13/17 0409  BP: (!) 166/66 (!) 154/67  (!) 162/71  Pulse:   (!) 53 (!) 58  Resp: 18 19 (!) 22 14  Temp: 97.8 F (36.6 C) 97.9 F (36.6 C)  97.6 F (36.4 C)  TempSrc: Oral Oral  Oral  SpO2:   99% 99%  Weight:      Height:        Intake/Output Summary (Last 24 hours) at 09/13/2017 0754 Last data filed at 09/12/2017 2157 Gross per 24 hour  Intake 360 ml  Output 0 ml  Net 360 ml   Filed Weights   09/12/17 1619  Weight: 169 lb 15.6 oz (77.1 kg)     Physical Exam   General: Well developed, well nourished, male appearing in no acute distress. Head: Normocephalic, atraumatic.  Neck: Supple without bruits, no JVD Lungs:  Resp regular and unlabored, CTA. Heart: RRR, S1, S2, no S3, S4, or murmur; no rub. Abdomen: Soft, non-tender, non-distended with normoactive bowel sounds. No hepatomegaly. No rebound/guarding. No obvious abdominal masses. Extremities: No clubbing, cyanosis,  trace edema. Distal pedal pulses are 2+ bilaterally. Neuro: Alert and oriented X 3. Moves all extremities spontaneously. Psych: Normal affect.  Labs    Chemistry Recent Labs  Lab 09/12/17 0920 09/13/17 0341  NA 132* 133*  K 5.6* 5.2*  CL 90* 93*  CO2 27 27  GLUCOSE 167* 135*  BUN 34* 44*  CREATININE 7.19* 8.62*  CALCIUM 8.9 8.5*  GFRNONAA 7* 6*  GFRAA 9* 7*  ANIONGAP 15 13     Hematology Recent Labs  Lab 09/12/17 0930 09/13/17 0341  WBC 15.0* 9.2  RBC 3.35* 3.28*  HGB 10.2* 10.0*  HCT 30.9* 30.6*  MCV 92.2 93.3  MCH 30.4 30.5  MCHC 33.0 32.7  RDW 14.0 14.0  PLT 139* 120*    Cardiac Enzymes Recent Labs  Lab 09/12/17 1613 09/12/17 2209 09/13/17 0341  TROPONINI 0.04* 0.03* 0.03*    Recent Labs  Lab 09/12/17 0939  TROPIPOC 0.03     BNPNo results for input(s): BNP, PROBNP in the last 168 hours.   DDimer No results for input(s): DDIMER in the last 168 hours.   Radiology    Dg Chest Port 1 View  Result Date: 09/12/2017 CLINICAL DATA:  61 year old male with  a history of chest pain EXAM: PORTABLE CHEST 1 VIEW COMPARISON:  07/06/2017 FINDINGS: Cardiomediastinal silhouette unchanged with surgical changes of median sternotomy and CABG. Calcifications of the aortic arch. No pneumothorax or pleural effusion. No confluent airspace disease. Coarsened interstitial markings similar prior. No evidence of interlobular septal thickening IMPRESSION: Negative for acute cardiopulmonary disease. Surgical changes of prior median sternotomy and CABG Electronically Signed   By: Corrie Mckusick D.O.   On: 09/12/2017 10:09   Dg Foot 2 Views Left  Result Date: 09/12/2017 CLINICAL DATA:  Diabetic foot ulcers. EXAM: LEFT FOOT - 2 VIEW COMPARISON:  None. FINDINGS: There is soft tissue irregularity and ulceration at the plantar bases of the toes. No subcutaneous emphysema. No osteolysis or cortical irregularity. No fracture. Joint spaces are relatively preserved. The bones are mildly  osteopenic. IMPRESSION: 1. Soft tissue irregularity and ulceration at the plantar bases of the toes. No acute osseous abnormality. Electronically Signed   By: Titus Dubin M.D.   On: 09/12/2017 10:08     Telemetry    Sinus on telemetry in HD - Personally Reviewed  ECG    No new tracings - Personally Reviewed   Cardiac Studies   LHC 07/07/17: Successful angioplasty and drug-eluting stent placement to the ostial SVG to OM.  Recommendations: Dual antiplatelet therapy for at least one year.  Aggressive treatment of risk factors.   Echo 07/06/17: Study Conclusions - Left ventricle: The cavity size was normal. There was mild   concentric hypertrophy. Systolic function was normal. The   estimated ejection fraction was 55%. There is akinesis of the   basal-midinferolateral, inferior, and inferoseptal myocardium.   There is akinesis of the basalanteroseptal myocardium. - Aortic valve: Moderate focal calcification involving the left   coronary cusp. - Mitral valve: There was mild regurgitation. - Pulmonic valve: There was trivial regurgitation. - Pulmonary arteries: PA peak pressure: 32 mm Hg (S).  Patient Profile     61 y.o. male with a history of ESRD on HD, CAD s/p CABG x 3 (0/0938) complicated by transient Afib, subsequent NSTEMI and DES to SVG to OM (07/07/17), DM, HTN, chronic systolic heart failure, moderate LVH, and followed in HF clinic.   Assessment & Plan    1. Chronic systolic and diastolic heart failure - recent echo with EF of 55% - pt does not appear volume overloaded - continue fluid management per nephrology   2. Chest pain, CAD s/p CABG x 3 (05/2016), PCI with DES to SVG to OM 07/07/17 - troponin: 0.04 --> 0.03 --> 0.03 - EKG with old inferior infarct unchanged from previous and ST changes in precordial leads that appear stable, will review with attending - he had prolonged chest pain in the setting of dialysis; however, troponins remained low and flat and  abnormal EKG is unchanged from previous - he continues to c/o chest twisting, but also c/o chest congestion and productive cough (phlegm) - would recommend mucinex. If he continues to have chest pain after he recovers from URI, may titrate imdur   3. Hypertensive heart disease - continue home meds hydralazine, imdur, losartan, and toprol - pressures have been elevated here in the 160s, will defer management to nephrology in the setting of dialysis to avoid hypotension on dialysis days - may consider titrating imdur if he continues to have chest pain in the setting of an URI   4. DM - per primary team   Signed, Ledora Bottcher , PA-C 7:54 AM 09/13/2017 Pager: [phone]  I have  personally seen and examined this patient with Doreene Adas, PA. I agree with the assessment and plan as outlined above. He has chest pain but no evidence of acute coronary syndrome. His EKG is very abnormal at baseline and does not appear to be changed on admission. Troponin has been slightly elevated in the setting of ESRD but flat trend. He is known to have severe CAD with prior CABG and last cath just 10 weeks ago with placement of a drug eluting stent in the vein graft to the OM. His RCA is occluded and fills from collaterals. His LAD has high grade disease and fills from the patent LIMA graft.  He does not think this pain feels like his prior angina. He is also coughing. Since there is no evidence of acute coronary syndrome, I think it is reasonable to attempt medical management at this time and adjust his Imdur. As noted, he has had a very recent cardiac cath.   Lauree Chandler 09/13/2017 10:22 AM

## 2017-09-15 ENCOUNTER — Encounter (HOSPITAL_COMMUNITY): Payer: Medicare Other

## 2017-09-16 ENCOUNTER — Ambulatory Visit (INDEPENDENT_AMBULATORY_CARE_PROVIDER_SITE_OTHER): Payer: Medicare Other

## 2017-09-16 ENCOUNTER — Other Ambulatory Visit: Payer: Self-pay | Admitting: Podiatry

## 2017-09-16 ENCOUNTER — Ambulatory Visit (INDEPENDENT_AMBULATORY_CARE_PROVIDER_SITE_OTHER): Payer: Medicare Other | Admitting: Podiatry

## 2017-09-16 DIAGNOSIS — E08621 Diabetes mellitus due to underlying condition with foot ulcer: Secondary | ICD-10-CM

## 2017-09-16 DIAGNOSIS — M79672 Pain in left foot: Secondary | ICD-10-CM

## 2017-09-16 DIAGNOSIS — L97503 Non-pressure chronic ulcer of other part of unspecified foot with necrosis of muscle: Secondary | ICD-10-CM | POA: Diagnosis not present

## 2017-09-16 MED ORDER — SILVER SULFADIAZINE 1 % EX CREA: 1.0000 "application " | TOPICAL_CREAM | Freq: Every day | CUTANEOUS | 0 refills | Status: DC
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
## 2017-09-16 NOTE — Progress Notes (Signed)
   Subjective:    Patient ID: Kenneth [name], male    DOB: 07/03/1956, 62 y.o.   MRN: [id number]  HPI    Review of Systems  All other systems reviewed and are negative.      Objective:   Physical Exam        Assessment & Plan:

## 2017-09-17 ENCOUNTER — Encounter (HOSPITAL_COMMUNITY): Payer: Medicare Other

## 2017-09-17 LAB — CULTURE, BLOOD (ROUTINE X 2)
Culture: NO GROWTH
Culture: NO GROWTH
SPECIAL REQUESTS: ADEQUATE
SPECIAL REQUESTS: ADEQUATE

## 2017-09-20 ENCOUNTER — Encounter (HOSPITAL_COMMUNITY): Payer: Medicare Other

## 2017-09-22 ENCOUNTER — Encounter (HOSPITAL_COMMUNITY): Payer: Medicare Other

## 2017-09-24 ENCOUNTER — Encounter (HOSPITAL_COMMUNITY): Payer: Medicare Other

## 2017-09-27 ENCOUNTER — Encounter (HOSPITAL_COMMUNITY): Payer: Medicare Other

## 2017-09-29 ENCOUNTER — Ambulatory Visit
Admission: RE | Admit: 2017-09-29 | Discharge: 2017-09-29 | Disposition: A | Discharge status: Home / Self Care | Payer: Medicare Other | Source: Ambulatory Visit | Attending: Internal Medicine | Admitting: Internal Medicine

## 2017-09-29 ENCOUNTER — Encounter: Payer: Medicare Other | Attending: Internal Medicine | Admitting: Internal Medicine

## 2017-09-29 ENCOUNTER — Encounter (HOSPITAL_COMMUNITY): Payer: Medicare Other

## 2017-09-29 ENCOUNTER — Other Ambulatory Visit: Payer: Self-pay | Admitting: Internal Medicine

## 2017-09-29 DIAGNOSIS — D649 Anemia, unspecified: Secondary | ICD-10-CM | POA: Insufficient documentation

## 2017-09-29 DIAGNOSIS — E1140 Type 2 diabetes mellitus with diabetic neuropathy, unspecified: Secondary | ICD-10-CM | POA: Diagnosis not present

## 2017-09-29 DIAGNOSIS — Z794 Long term (current) use of insulin: Secondary | ICD-10-CM | POA: Diagnosis not present

## 2017-09-29 DIAGNOSIS — I10 Essential (primary) hypertension: Secondary | ICD-10-CM | POA: Insufficient documentation

## 2017-09-29 DIAGNOSIS — E11621 Type 2 diabetes mellitus with foot ulcer: Secondary | ICD-10-CM | POA: Diagnosis not present

## 2017-09-29 DIAGNOSIS — L97528 Non-pressure chronic ulcer of other part of left foot with other specified severity: Secondary | ICD-10-CM | POA: Insufficient documentation

## 2017-09-29 DIAGNOSIS — B999 Unspecified infectious disease: Secondary | ICD-10-CM | POA: Diagnosis not present

## 2017-09-29 DIAGNOSIS — M069 Rheumatoid arthritis, unspecified: Secondary | ICD-10-CM | POA: Diagnosis not present

## 2017-09-29 DIAGNOSIS — I252 Old myocardial infarction: Secondary | ICD-10-CM | POA: Diagnosis not present

## 2017-09-30 NOTE — Progress Notes (Addendum)
KAIROS, PANETTA ([id number]) Visit Report for 09/29/2017 Debridement Details Patient Name: Kenneth [name], Kenneth [name] Date of Service: 09/29/2017 12:30 PM Medical Record Number: [id number] Patient Account Number: [id number] Date of Birth/Sex: November 22, 1955 (61 y.o. Male) Treating RN: Roger Shelter Primary Care Provider: Di Kindle Other Clinician: Referring Provider: Di Kindle Treating Provider/Extender: Ricard Dillon Weeks in Treatment: 0 Debridement Performed for Wound #1 Left Toe Great Assessment: Performed By: Physician Ricard Dillon, MD Debridement: Debridement Pre-procedure Verification/Time Yes - 01:57 Out Taken: Start Time: 01:57 Pain Control: Other : lidocaine 4% Level: Skin/Subcutaneous Tissue Total Area Debrided (L x W): 2.5 (cm) x 2.2 (cm) = 5.5 (cm) Tissue and other material Viable, Non-Viable, Callus, Fibrin/Slough, Subcutaneous debrided: Instrument: Curette Bleeding: Moderate Hemostasis Achieved: Pressure End Time: 01:58 Procedural Pain: 0 Post Procedural Pain: 0 Response to Treatment: Procedure was tolerated well Post Debridement Measurements of Total Wound Length: (cm) 2.5 Stage: Category/Stage II Width: (cm) 2.2 Depth: (cm) 0.2 Volume: (cm) 0.864 Character of Wound/Ulcer Post Stable Debridement: Post Procedure Diagnosis Same as Pre-procedure Electronic Signature(s) Signed: 09/29/2017 4:51:06 PM By: Roger Shelter Signed: 09/29/2017 4:56:29 PM By: Linton Ham MD Entered By: Linton Ham on 09/29/2017 14:16:43 Kenneth [name], Kenneth [name] ([id number]) -------------------------------------------------------------------------------- Debridement Details Patient Name: Kenneth [name] Date of Service: 09/29/2017 12:30 PM Medical Record Number: [id number] Patient Account Number: [id number] Date of Birth/Sex: 09/18/1955 (61 y.o. Male) Treating RN: Roger Shelter Primary Care Provider: Di Kindle Other Clinician: Referring Provider: Di Kindle Treating  Provider/Extender: Ricard Dillon Weeks in Treatment: 0 Debridement Performed for Wound #2 Left Toe Second Assessment: Performed By: Physician Ricard Dillon, MD Debridement: Debridement Pre-procedure Verification/Time Yes - 01:57 Out Taken: Start Time: 01:57 Pain Control: Other : lidocaine 4% Level: Skin/Subcutaneous Tissue Total Area Debrided (L x W): 0.5 (cm) x 0.6 (cm) = 0.3 (cm) Tissue and other material Viable, Non-Viable, Callus, Fibrin/Slough, Subcutaneous debrided: Instrument: Curette Bleeding: Moderate Hemostasis Achieved: Pressure End Time: 01:58 Procedural Pain: 0 Post Procedural Pain: 0 Response to Treatment: Procedure was tolerated well Post Debridement Measurements of Total Wound Length: (cm) 0.5 Stage: Category/Stage II Width: (cm) 0.6 Depth: (cm) 0.2 Volume: (cm) 0.047 Character of Wound/Ulcer Post Stable Debridement: Post Procedure Diagnosis Same as Pre-procedure Electronic Signature(s) Signed: 09/29/2017 4:51:06 PM By: Roger Shelter Signed: 09/29/2017 4:56:29 PM By: Linton Ham MD Entered By: Linton Ham on 09/29/2017 14:16:54 Kenneth [name], Kenneth [name] ([id number]) -------------------------------------------------------------------------------- Debridement Details Patient Name: Kenneth [name] Date of Service: 09/29/2017 12:30 PM Medical Record Number: [id number] Patient Account Number: [id number] Date of Birth/Sex: 08-12-1956 (61 y.o. Male) Treating RN: Roger Shelter Primary Care Provider: Di Kindle Other Clinician: Referring Provider: Di Kindle Treating Provider/Extender: Ricard Dillon Weeks in Treatment: 0 Debridement Performed for Wound #3 Left Toe Third Assessment: Performed By: Physician Ricard Dillon, MD Debridement: Debridement Pre-procedure Verification/Time Yes - 01:57 Out Taken: Start Time: 01:57 Pain Control: Other : lidocaine 4% Level: Skin/Subcutaneous Tissue Total Area Debrided (L x W): 1.1 (cm) x 1.2 (cm)  = 1.32 (cm) Tissue and other material Viable, Non-Viable, Callus, Eschar, Fibrin/Slough, Subcutaneous debrided: Instrument: Curette Bleeding: Moderate Hemostasis Achieved: Pressure End Time: 01:58 Procedural Pain: 0 Post Procedural Pain: 0 Response to Treatment: Procedure was tolerated well Post Debridement Measurements of Total Wound Length: (cm) 1.1 Stage: Category/Stage II Width: (cm) 1.2 Depth: (cm) 0.2 Volume: (cm) 0.207 Character of Wound/Ulcer Post Stable Debridement: Post Procedure Diagnosis Same as Pre-procedure Electronic Signature(s) Signed: 09/29/2017 4:51:06 PM By: Roger Shelter Signed: 09/29/2017 4:56:29 PM By: Linton Ham MD Entered By: Linton Ham  on 09/29/2017 14:17:17 Kenneth [name], Kenneth [name] ([id number]) -------------------------------------------------------------------------------- Debridement Details Patient Name: Kenneth [name], Kenneth [name] Date of Service: 09/29/2017 12:30 PM Medical Record Number: [id number] Patient Account Number: [id number] Date of Birth/Sex: March 26, 1956 (61 y.o. Male) Treating RN: Roger Shelter Primary Care Provider: Di Kindle Other Clinician: Referring Provider: Di Kindle Treating Provider/Extender: Ricard Dillon Weeks in Treatment: 0 Debridement Performed for Wound #4 Left Toe Fourth Assessment: Performed By: Physician Ricard Dillon, MD Debridement: Debridement Pre-procedure Verification/Time Yes - 01:57 Out Taken: Start Time: 01:57 Pain Control: Other : lidocaine 4% Level: Skin/Subcutaneous Tissue Total Area Debrided (L x W): 1.6 (cm) x 1.2 (cm) = 1.92 (cm) Tissue and other material Viable, Non-Viable, Callus, Eschar, Fibrin/Slough, Subcutaneous debrided: Instrument: Curette Bleeding: Moderate Hemostasis Achieved: Silver Nitrate End Time: 01:58 Procedural Pain: 0 Post Procedural Pain: 0 Response to Treatment: Procedure was tolerated well Post Debridement Measurements of Total Wound Length: (cm) 1.6 Stage:  Category/Stage II Width: (cm) 1.2 Depth: (cm) 0.3 Volume: (cm) 0.452 Character of Wound/Ulcer Post Stable Debridement: Post Procedure Diagnosis Same as Pre-procedure Electronic Signature(s) Signed: 09/29/2017 4:51:06 PM By: Roger Shelter Signed: 09/29/2017 4:56:29 PM By: Linton Ham MD Entered By: Linton Ham on 09/29/2017 14:17:27 Kenneth [name], Kenneth [name] ([id number]) -------------------------------------------------------------------------------- Debridement Details Patient Name: Kenneth [name] Date of Service: 09/29/2017 12:30 PM Medical Record Number: [id number] Patient Account Number: [id number] Date of Birth/Sex: Mar 21, 1956 (61 y.o. Male) Treating RN: Roger Shelter Primary Care Provider: Di Kindle Other Clinician: Referring Provider: Di Kindle Treating Provider/Extender: Ricard Dillon Weeks in Treatment: 0 Debridement Performed for Wound #5 Left Toe Fifth Assessment: Performed By: Physician Ricard Dillon, MD Debridement: Debridement Pre-procedure Verification/Time Yes - 01:57 Out Taken: Start Time: 01:57 Pain Control: Other : lidocaine 4% Level: Skin/Subcutaneous Tissue Total Area Debrided (L x W): 3.1 (cm) x 1.6 (cm) = 4.96 (cm) Tissue and other material Viable, Non-Viable, Callus, Fibrin/Slough, Subcutaneous debrided: Instrument: Curette Bleeding: Moderate Hemostasis Achieved: Pressure End Time: 01:58 Procedural Pain: 0 Post Procedural Pain: 0 Response to Treatment: Procedure was tolerated well Post Debridement Measurements of Total Wound Length: (cm) 3.1 Stage: Category/Stage II Width: (cm) 1.6 Depth: (cm) 0.2 Volume: (cm) 0.779 Character of Wound/Ulcer Post Stable Debridement: Post Procedure Diagnosis Same as Pre-procedure Electronic Signature(s) Signed: 09/29/2017 4:51:06 PM By: Roger Shelter Signed: 09/29/2017 4:56:29 PM By: Linton Ham MD Entered By: Linton Ham on 09/29/2017 14:17:59 Kenneth [name], Kenneth [name]  ([id number]) -------------------------------------------------------------------------------- HPI Details Patient Name: Kenneth [name] Date of Service: 09/29/2017 12:30 PM Medical Record Number: [id number] Patient Account Number: [id number] Date of Birth/Sex: [birth year] (61 y.o. Male) Treating RN: Roger Shelter Primary Care Provider: Di Kindle Other Clinician: Referring Provider: Di Kindle Treating Provider/Extender: Ricard Dillon Weeks in Treatment: 0 History of Present Illness HPI Description: [place] 02/25/17; this is a 62 year old man with type 2 diabetes who has an insulin pump. He has had problems for the last 2 months with wounds on the dorsal aspect of his right second and right third toe. This presented as blisters. He was admitted to hospital from 5/7 through 5/10 with cellulitis in the foot. Cultures at that time grew methicillin sensitive staph aureus. He was treated with Vanco and Zosyn and discharged on Augmentin. He was recorded as having a black necrotic eschar. Arterial studies done showed biphasic and triphasic waveforms. On the right posterior tibial ABI of 0.96 on the left at 0.99. Total brachial index on the right was 0.62 and on the left at 0.76. It was felt there was a mild decrease in right-sided ABI  when compared to a prior study. I'm not completely certain what he has been using on these wounds recently although he does have santyl. 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 He has been followed by his podiatrist. He had an MRI of the foot and 01/21/17 which showed subcutaneous edema over the dorsum of the foot compatible with cellulitis. There was no abscess convincing evidence of osteomyelitis and negative for septic arthritis. The patient is here for review of nonhealing wounds as described. He is not working. 03/04/17; patient arrives today with still an adherent necrotic material over the surface of both wounds. He has continued  to use Santyl and I think we'll need to continue to use Santyl. He is had a reasonably complete workup including arterial studies and an MRI. This initially presented as an infection in the toes was cellulitis and a necrotic surface. 03/11/17; still requires ongoing debridement. He received Santyl but he just got it yesterday but he's been using some supply that we gave to him. The area on the third toe appears to be making good progress. The second toe perhaps slightly improved. Both areas still have a tightly adherent necrotic surface that does not wash off. Still requires ongoing debridement 03/18/17; both wound surface is look better he is been using Santyl. 03/25/17 right third toe was just about closed perhaps a very tiny open area inferiorly. The second toe is still open however the base of this looks much better. I think we can switch to silver collagen to the right second toe in place of Santyl 04/01/17; the patient arrived today with a scissor induced injury on the lateral aspect of the same right second toe. He was cutting off the dressing in order to take a shower this morning. Arrived in clinic today with a bleeding area. Her was no laceration per se and nothing that could be sutured a whole surface layer of tissue was taken off the lateral aspect of the right second toe. In the meantime the right third toe is healed. Dorsal right second toe improved but still not closed. We've been using silver collagen 04/08/17; the patient's wound on the dorsal right third toe is healed still some surface eschar that I did not disturb today. His original wound on the dorsal aspect of the right second toe continues to look a lot better HOWEVER the scissor injury on the lateral aspect of the tip of his right great toe is a lot worse. Necrotic surface. He does not complain of pain. However he did not dress this all week. 04/20/2017 -- because of his poor eyesight the patient is cutting his surrounding toes  when he uses a sharp scissors. I have forbidden him from doing that. He has another wound on the right third toe. 05/06/17; the wounds on his dorsal second and third toes appear closed. He has however an open wound on the medial aspect of the third toe at the level of the PIP joint. This apparently started as a blister and no doubt was due to friction 05/20/17; still has a wound on the medial aspect of his third toe just below the PIP. This does not look infected does not probe the bone but is not made much progress since last time. He also arrives today with a swelling on the lateral aspect of the right first toe compatible with a paronychia in this location 06/03/17; area on the 3rd toe is epithelizalized. and healed ADMISSION 09/29/17 this is a now  62 year old man who I cared for last summer in our sister clinic in [place]. At that point the predominant wounds were on the right second and third toes I think he had a scissor injury on the right first toe. He is here Kenneth [name], Kenneth [name]. ([id number]) actually for review of wounds on his left foot. This apparently was noted by the patient while he was in the shower about a month ago over the plantar aspect of all 5 toes. He was seen by podiatry on 09/16/17 where there was an x-ray of the foot done I think he was given Silvadene cream. I am not sure that they actually followed him up. The patient is not really clear how this happened. He does walk frequently. He denies any excessive heat in the shower that could've been sufficient to cause wounds on the plantar aspect of all his 5 toes. He has known diabetic neuropathy. He does not have any history of diabetic PAD. Arterial studies done in May 2018 showed an ABI on the right of 0.96 on the left of 0.99. TBI in the right at 0.62 and on the left 0.76. As noted I think he is been using Silvadene cream Electronic Signature(s) Signed: 09/29/2017 4:56:29 PM By: Linton Ham MD Entered By: Linton Ham on  09/29/2017 14:41:59 Kenneth [name], Kenneth [name] ([id number]) -------------------------------------------------------------------------------- Physical Exam Details Patient Name: Kenneth [name] Date of Service: 09/29/2017 12:30 PM Medical Record Number: [id number] Patient Account Number: [id number] Date of Birth/Sex: 12-06-1955 (61 y.o. Male) Treating RN: Roger Shelter Primary Care Provider: Di Kindle Other Clinician: Referring Provider: Di Kindle Treating Provider/Extender: Ricard Dillon Weeks in Treatment: 0 Constitutional Sitting or standing Blood Pressure is within target range for patient.. Pulse regular and within target range for patient.Marland Kitchen Respirations regular, non-labored and within target range.. Temperature is normal and within the target range for the patient.Marland Kitchen appears in no distress. Eyes Conjunctivae clear. No discharge. Respiratory Respiratory effort is easy and symmetric bilaterally. Rate is normal at rest and on room air.. Cardiovascular Heart rhythm and rate regular, without murmur or gallop.. Pedal pulses palpable and strong bilaterally.. Lymphatic Nonpalpable no popliteal or inguinal area. Integumentary (Hair, Skin) No rash. Neurological Completely reduced vibration sense and light touch. No discomfort with debridement. Notes Wound exam; area questions or on the plantar aspect/tip of all the toes on the left foot. The area on the fifth toe extends much more proximally to the upper part of the met head. All of these in the same general situation nonviable pale reddish yellow surface. There is no evidence of infection Electronic Signature(s) Signed: 09/29/2017 4:56:29 PM By: Linton Ham MD Entered By: Linton Ham on 09/29/2017 14:43:52 Kenneth [name], Kenneth [name] ([id number]) -------------------------------------------------------------------------------- Physician Orders Details Patient Name: Kenneth [name] Date of Service: 09/29/2017 12:30 PM Medical Record Number:  [id number] Patient Account Number: [id number] Date of Birth/Sex: 1956/07/23 (61 y.o. Male) Treating RN: Roger Shelter Primary Care Provider: Di Kindle Other Clinician: Referring Provider: Di Kindle Treating Provider/Extender: Tito Dine in Treatment: 0 Verbal / Phone Orders: No Diagnosis Coding ICD-10 Coding Code Description E11.621 Type 2 diabetes mellitus with foot ulcer L97.528 Non-pressure chronic ulcer of other part of left foot with other specified severity E11.40 Type 2 diabetes mellitus with diabetic neuropathy, unspecified Wound Cleansing Wound #1 Left Toe Great o Clean wound with Normal Saline. Wound #2 Left Toe Second o Clean wound with Normal Saline. Wound #3 Left Toe Third o Clean wound with Normal Saline. Wound #4 Left Toe Fourth o Clean  wound with Normal Saline. Wound #5 Left Toe Fifth o Clean wound with Normal Saline. Skin Barriers/Peri-Wound Care o Other: - silver nitrate to great toe, fourth and fifth toe to stop bleeding Primary Wound Dressing Wound #1 Left Toe Great o Santyl Ointment Wound #2 Left Toe Second o Santyl Ointment Wound #3 Left Toe Third o Santyl Ointment Wound #4 Left Toe Fourth o Santyl Ointment Wound #5 Left Toe Fifth o Santyl Ointment Secondary Dressing Wound #1 Left Toe Great o ABD pad Mellen, Teegan W. ([id number]) o Gauze and Kerlix/Conform Wound #2 Left Toe Second o ABD pad o Gauze and Kerlix/Conform Wound #3 Left Toe Third o ABD pad o Gauze and Kerlix/Conform Wound #4 Left Toe Fourth o ABD pad o Gauze and Kerlix/Conform Wound #5 Left Toe Fifth o ABD pad o Gauze and Kerlix/Conform Dressing Change Frequency Wound #1 Left Toe Great o Change dressing every day. Wound #2 Left Toe Second o Change dressing every day. Wound #3 Left Toe Third o Change dressing every day. Wound #4 Left Toe Fourth o Change dressing every day. Wound #5 Left Toe Fifth o Change  dressing every day. Follow-up Appointments Wound #1 Left Toe Great o Return Appointment in 1 week. Wound #2 Left Toe Second o Return Appointment in 1 week. Wound #3 Left Toe Third o Return Appointment in 1 week. Wound #4 Left Toe Fourth o Return Appointment in 1 week. Wound #5 Left Toe Fifth o Return Appointment in 1 week. Off-Loading Wound #1 Left Toe Great o Open toe surgical shoe to: - darko off loading shoe for frontal offloading Wound #2 Left Toe Second o Open toe surgical shoe to: - darko off loading shoe for frontal offloading Thornley, Mclain W. ([id number]) Wound #3 Left Toe Third o Open toe surgical shoe to: - darko off loading shoe for frontal offloading Wound #4 Left Toe Fourth o Open toe surgical shoe to: - darko off loading shoe for frontal offloading Wound #5 Left Toe Fifth o Open toe surgical shoe to: - darko off loading shoe for frontal offloading Radiology o X-ray, foot - left foot xray for fourth toe possible infection Patient Medications Allergies: No Known Drug Allergies Notifications Medication Indication Start End Santyl 09/29/2017 DOSE topical 250 unit/gram ointment - ointment topical to wounds daily Electronic Signature(s) Signed: 09/30/2017 8:43:28 AM By: Roger Shelter Signed: 10/06/2017 5:00:32 PM By: Linton Ham MD Previous Signature: 09/29/2017 4:51:06 PM Version By: Roger Shelter Previous Signature: 09/29/2017 4:56:29 PM Version By: Linton Ham MD Previous Signature: 09/29/2017 2:19:53 PM Version By: Linton Ham MD Entered By: Roger Shelter on 09/30/2017 08:43:18 Kenneth [name], Kenneth [name] ([id number]) -------------------------------------------------------------------------------- Problem List Details Patient Name: Kenneth [name] Date of Service: 09/29/2017 12:30 PM Medical Record Number: [id number] Patient Account Number: [id number] Date of Birth/Sex: Sep 02, 1956 (61 y.o. Male) Treating RN: Roger Shelter Primary Care  Provider: Di Kindle Other Clinician: Referring Provider: Di Kindle Treating Provider/Extender: Ricard Dillon Weeks in Treatment: 0 Active Problems ICD-10 Encounter Code Description Active Date Diagnosis E11.621 Type 2 diabetes mellitus with foot ulcer 09/29/2017 Yes L97.528 Non-pressure chronic ulcer of other part of left foot with other 09/29/2017 Yes specified severity E11.40 Type 2 diabetes mellitus with diabetic neuropathy, unspecified 09/29/2017 Yes Inactive Problems Resolved Problems Electronic Signature(s) Signed: 09/29/2017 4:56:29 PM By: Linton Ham MD Entered By: Linton Ham on 09/29/2017 14:16:15 Kenneth [name], Kenneth [name] ([id number]) -------------------------------------------------------------------------------- Progress Note Details Patient Name: Kenneth [name] Date of Service: 09/29/2017 12:30 PM Medical Record Number: [id number] Patient Account Number: [id number] Date of Birth/Sex:  02-Mar-1956 (62 y.o. Male) Treating RN: Roger Shelter Primary Care Provider: Di Kindle Other Clinician: Referring Provider: Di Kindle Treating Provider/Extender: Ricard Dillon Weeks in Treatment: 0 Subjective History of Present Illness (HPI) [place] 02/25/17; this is a 62 year old man with type 2 diabetes who has an insulin pump. He has had problems for the last 2 months with wounds on the dorsal aspect of his right second and right third toe. This presented as blisters. He was admitted to hospital from 5/7 through 5/10 with cellulitis in the foot. Cultures at that time grew methicillin sensitive staph aureus. He was treated with Vanco and Zosyn and discharged on Augmentin. He was recorded as having a black necrotic eschar. Arterial studies done showed biphasic and triphasic waveforms. On the right posterior tibial ABI of 0.96 on the left at 0.99. Total brachial index on the right was 0.62 and on the left at 0.76. It was felt there was a mild decrease in right-sided ABI when compared  to a prior study. I'm not completely certain what he has been using on these wounds recently although he does have santyl. 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 He has been followed by his podiatrist. He had an MRI of the foot and 01/21/17 which showed subcutaneous edema over the dorsum of the foot compatible with cellulitis. There was no abscess convincing evidence of osteomyelitis and negative for septic arthritis. The patient is here for review of nonhealing wounds as described. He is not working. 03/04/17; patient arrives today with still an adherent necrotic material over the surface of both wounds. He has continued to use Santyl and I think we'll need to continue to use Santyl. He is had a reasonably complete workup including arterial studies and an MRI. This initially presented as an infection in the toes was cellulitis and a necrotic surface. 03/11/17; still requires ongoing debridement. He received Santyl but he just got it yesterday but he's been using some supply that we gave to him. The area on the third toe appears to be making good progress. The second toe perhaps slightly improved. Both areas still have a tightly adherent necrotic surface that does not wash off. Still requires ongoing debridement 03/18/17; both wound surface is look better he is been using Santyl. 03/25/17 right third toe was just about closed perhaps a very tiny open area inferiorly. The second toe is still open however the base of this looks much better. I think we can switch to silver collagen to the right second toe in place of Santyl 04/01/17; the patient arrived today with a scissor induced injury on the lateral aspect of the same right second toe. He was cutting off the dressing in order to take a shower this morning. Arrived in clinic today with a bleeding area. Her was no laceration per se and nothing that could be sutured a whole surface layer of tissue was taken off the lateral aspect of  the right second toe. In the meantime the right third toe is healed. Dorsal right second toe improved but still not closed. We've been using silver collagen 04/08/17; the patient's wound on the dorsal right third toe is healed still some surface eschar that I did not disturb today. His original wound on the dorsal aspect of the right second toe continues to look a lot better HOWEVER the scissor injury on the lateral aspect of the tip of his right great toe is a lot worse. Necrotic surface. He does not complain of  pain. However he did not dress this all week. 04/20/2017 -- because of his poor eyesight the patient is cutting his surrounding toes when he uses a sharp scissors. I have forbidden him from doing that. He has another wound on the right third toe. 05/06/17; the wounds on his dorsal second and third toes appear closed. He has however an open wound on the medial aspect of the third toe at the level of the PIP joint. This apparently started as a blister and no doubt was due to friction 05/20/17; still has a wound on the medial aspect of his third toe just below the PIP. This does not look infected does not probe the bone but is not made much progress since last time. He also arrives today with a swelling on the lateral aspect of the right first toe compatible with a paronychia in this location 06/03/17; area on the 3rd toe is epithelizalized. and healed ADMISSION 09/29/17 this is a now 62 year old man who I cared for last summer in our sister clinic in [place]. At that point the [place]. ([id number]) predominant wounds were on the right second and third toes I think he had a scissor injury on the right first toe. He is here actually for review of wounds on his left foot. This apparently was noted by the patient while he was in the shower about a month ago over the plantar aspect of all 5 toes. He was seen by podiatry on 09/16/17 where there was an x-ray of the foot done I think he was given  Silvadene cream. I am not sure that they actually followed him up. The patient is not really clear how this happened. He does walk frequently. He denies any excessive heat in the shower that could've been sufficient to cause wounds on the plantar aspect of all his 5 toes. He has known diabetic neuropathy. He does not have any history of diabetic PAD. Arterial studies done in May 2018 showed an ABI on the right of 0.96 on the left of 0.99. TBI in the right at 0.62 and on the left 0.76. As noted I think he is been using Silvadene cream Wound History Patient presents with 5 open wounds that have been present for approximately 1 month. Patient has been treating wounds in the following manner: OTC cream. Laboratory tests have been performed in the last month. Patient reportedly has not tested positive for an antibiotic resistant organism. Patient reportedly has not tested positive for osteomyelitis. Patient reportedly has had testing performed to evaluate circulation in the legs. Patient History Allergies No Known Drug Allergies Social History Never smoker, Marital Status - Married, Alcohol Use - Never, Drug Use - No History, Caffeine Use - Rarely. Medical History Eyes Patient has history of Cataracts Hematologic/Lymphatic Patient has history of Anemia Denies history of Hemophilia, Human Immunodeficiency Virus, Lymphedema, Sickle Cell Disease Respiratory Denies history of Aspiration, Asthma, Chronic Obstructive Pulmonary Disease (COPD), Pneumothorax, Tuberculosis Cardiovascular Patient has history of Hypertension, Myocardial Infarction Denies history of Angina, Arrhythmia, Congestive Heart Failure, Coronary Artery Disease, Deep Vein Thrombosis, Hypotension, Peripheral Arterial Disease, Peripheral Venous Disease, Phlebitis, Vasculitis Gastrointestinal Denies history of Cirrhosis , Colitis, Crohn s, Hepatitis A, Hepatitis B, Hepatitis C Endocrine Patient has history of Type I  Diabetes Immunological Denies history of Lupus Erythematosus, Raynaud s, Scleroderma Integumentary (Skin) Denies history of History of Burn, History of pressure wounds Musculoskeletal Patient has history of Rheumatoid Arthritis Denies history of Gout, Osteoarthritis, Osteomyelitis Neurologic Denies history of Dementia, Neuropathy,  Quadriplegia, Paraplegia, Seizure Disorder Oncologic Denies history of Received Chemotherapy, Received Radiation Psychiatric Denies history of Anorexia/bulimia, Confinement Anxiety Patient is treated with Insulin. Blood sugar is tested. Blood sugar results noted at the following times: Breakfast - 108. Hospitalization/Surgery History - [place], chest pain. BEREN, YNIGUEZ ([id number]) Review of Systems (ROS) Constitutional Symptoms (General Health) Denies complaints or symptoms of Fatigue, Fever, Chills, Marked Weight Change. Eyes Complains or has symptoms of Dry Eyes, Glasses / Contacts - glasses. Denies complaints or symptoms of Vision Changes. Ear/Nose/Mouth/Throat The patient has no complaints or symptoms. Respiratory Denies complaints or symptoms of Chronic or frequent coughs, Shortness of Breath. Cardiovascular Denies complaints or symptoms of Chest pain, LE edema. Gastrointestinal The patient has no complaints or symptoms. Genitourinary Complains or has symptoms of Kidney failure/ Dialysis. Immunological Denies complaints or symptoms of Hives, Itching. Integumentary (Skin) Complains or has symptoms of Wounds, Bleeding or bruising tendency. Denies complaints or symptoms of Breakdown, Swelling. Musculoskeletal Denies complaints or symptoms of Muscle Pain, Muscle Weakness. Neurologic Complains or has symptoms of Numbness/parasthesias - feet. Oncologic The patient has no complaints or symptoms. Psychiatric Denies complaints or symptoms of Anxiety, Claustrophobia. Objective Constitutional Sitting or standing Blood Pressure is within target  range for patient.. Pulse regular and within target range for patient.Marland Kitchen Respirations regular, non-labored and within target range.. Temperature is normal and within the target range for the patient.Marland Kitchen appears in no distress. Vitals Time Taken: 12:51 PM, Height: 67 in, Source: Stated, Weight: 150 lbs, Source: Measured, BMI: 23.5, Temperature: 98.4 F, Pulse: 80 bpm, Respiratory Rate: 18 breaths/min, Blood Pressure: 144/54 mmHg. Eyes Conjunctivae clear. No discharge. Respiratory Respiratory effort is easy and symmetric bilaterally. Rate is normal at rest and on room air.. Cardiovascular Heart rhythm and rate regular, without murmur or gallop.. Pedal pulses palpable and strong bilaterally.. Lymphatic Nonpalpable no popliteal or inguinal area. RAED, SCHALK. ([id number]) Neurological Completely reduced vibration sense and light touch. No discomfort with debridement. General Notes: Wound exam; area questions or on the plantar aspect/tip of all the toes on the left foot. The area on the fifth toe extends much more proximally to the upper part of the met head. All of these in the same general situation nonviable pale reddish yellow surface. There is no evidence of infection Integumentary (Hair, Skin) No rash. Wound #1 status is Open. Original cause of wound was Gradually Appeared. The wound is located on the Left Toe Great. The wound measures 2.5cm length x 2.2cm width x 0.1cm depth; 4.32cm^2 area and 0.432cm^3 volume. There is no tunneling or undermining noted. There is a medium amount of serosanguineous drainage noted. The wound margin is flat and intact. There is large (67-100%) pink granulation within the wound bed. There is a small (1-33%) amount of necrotic tissue within the wound bed including Adherent Slough. The periwound skin appearance exhibited: Callus, Scarring. The periwound skin appearance did not exhibit: Crepitus, Excoriation, Induration, Rash, Dry/Scaly, Maceration, Atrophie  Blanche, Cyanosis, Ecchymosis, Hemosiderin Staining, Mottled, Pallor, Rubor, Erythema. Wound #2 status is Open. Original cause of wound was Gradually Appeared. The wound is located on the Left Toe Second. The wound measures 0.5cm length x 0.6cm width x 0.1cm depth; 0.236cm^2 area and 0.024cm^3 volume. There is no tunneling or undermining noted. There is a none present amount of drainage noted. The wound margin is flat and intact. There is large (67-100%) granulation within the wound bed. There is a small (1-33%) amount of necrotic tissue within the wound bed including Eschar. The periwound skin  appearance exhibited: Callus. The periwound skin appearance did not exhibit: Crepitus, Excoriation, Induration, Rash, Scarring, Dry/Scaly, Maceration, Atrophie Blanche, Cyanosis, Ecchymosis, Hemosiderin Staining, Mottled, Pallor, Rubor, Erythema. Wound #3 status is Open. Original cause of wound was Gradually Appeared. The wound is located on the Left Toe Third. The wound measures 1.1cm length x 1.2cm width x 0.1cm depth; 1.037cm^2 area and 0.104cm^3 volume. There is a none present amount of drainage noted. The wound margin is flat and intact. There is large (67-100%) granulation within the wound bed. There is a small (1-33%) amount of necrotic tissue within the wound bed including Eschar. The periwound skin appearance exhibited: Callus, Scarring. The periwound skin appearance did not exhibit: Crepitus, Excoriation, Induration, Rash, Dry/Scaly, Maceration, Atrophie Blanche, Cyanosis, Ecchymosis, Hemosiderin Staining, Mottled, Pallor, Rubor, Erythema. Periwound temperature was noted as No Abnormality. The periwound has tenderness on palpation. Wound #4 status is Open. Original cause of wound was Gradually Appeared. The wound is located on the Left Toe Fourth. The wound measures 1.6cm length x 1.2cm width x 0.1cm depth; 1.508cm^2 area and 0.151cm^3 volume. There is Fat Layer (Subcutaneous Tissue) Exposed  exposed. There is no tunneling or undermining noted. There is a medium amount of serosanguineous drainage noted. The wound margin is flat and intact. There is medium (34-66%) red granulation within the wound bed. There is a medium (34-66%) amount of necrotic tissue within the wound bed including Eschar and Adherent Slough. The periwound skin appearance exhibited: Callus, Scarring. The periwound skin appearance did not exhibit: Crepitus, Excoriation, Induration, Rash, Dry/Scaly, Maceration, Atrophie Blanche, Cyanosis, Ecchymosis, Hemosiderin Staining, Mottled, Pallor, Rubor, Erythema. Wound #5 status is Open. Original cause of wound was Gradually Appeared. The wound is located on the Left Toe Fifth. The wound measures 3.1cm length x 1.6cm width x 0.1cm depth; 3.896cm^2 area and 0.39cm^3 volume. There is Fat Layer (Subcutaneous Tissue) Exposed exposed. There is no tunneling or undermining noted. There is a medium amount of serosanguineous drainage noted. The wound margin is flat and intact. There is medium (34-66%) red granulation within the wound bed. There is a medium (34-66%) amount of necrotic tissue within the wound bed including Eschar and Adherent Slough. The periwound skin appearance exhibited: Callus, Induration, Scarring. The periwound skin appearance did not exhibit: Crepitus, Excoriation, Rash, Dry/Scaly, Maceration, Atrophie Blanche, Cyanosis, Ecchymosis, Hemosiderin Staining, Mottled, Pallor, Rubor, Erythema. Periwound temperature was noted as No Abnormality. 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 There was a Skin/Subcutaneous Tissue  Debridement ([id number]) debridement with total area of 5.5 sq cm performed by Ricard Dillon, MD. with the following instrument(s): Curette to remove Viable and Non-Viable tissue/material including Fibrin/Slough, Callus, and Subcutaneous after achieving pain control using Other (lidocaine 4%). A time out was conducted at 01:57, prior to the start of the procedure. A Moderate amount of bleeding was controlled with Pressure. The procedure was tolerated well with a pain level of 0 throughout and a pain level of 0 following the procedure. Post Debridement Measurements: 2.5cm length x 2.2cm width x 0.2cm depth; 0.864cm^3 volume. Post debridement Stage noted as Category/Stage II. Character of Wound/Ulcer Post Debridement is stable. Post procedure Diagnosis Wound #1: Same as Pre-Procedure Wound #2 Pre-procedure diagnosis of Wound #2  is a Pressure Ulcer located on the Left Toe Second . There was a Skin/Subcutaneous Tissue Debridement ([id number]) debridement with total area of 0.3 sq cm performed by Ricard Dillon, MD. with the following instrument(s): Curette to remove Viable and Non-Viable tissue/material including Fibrin/Slough, Callus, and Subcutaneous after achieving pain control using Other (lidocaine 4%). A time out was conducted at 01:57, prior to the start of the procedure. A Moderate amount of bleeding was controlled with Pressure. The procedure was tolerated well with a pain level of 0 throughout and a pain level of 0 following the procedure. Post Debridement Measurements: 0.5cm length x 0.6cm width x 0.2cm depth; 0.047cm^3 volume. Post debridement Stage noted as Category/Stage II. Character of Wound/Ulcer Post Debridement is stable. Post procedure Diagnosis Wound #2: Same as Pre-Procedure Wound #3 Pre-procedure diagnosis of Wound #3 is a Pressure Ulcer located on the Left Toe Third . There was a Skin/Subcutaneous Tissue Debridement ([id number]) debridement with total area of 1.32 sq  cm performed by Ricard Dillon, MD. with the following instrument(s): Curette to remove Viable and Non-Viable tissue/material including Fibrin/Slough, Eschar, Callus, and Subcutaneous after achieving pain control using Other (lidocaine 4%). A time out was conducted at 01:57, prior to the start of the procedure. A Moderate amount of bleeding was controlled with Pressure. The procedure was tolerated well with a pain level of 0 throughout and a pain level of 0 following the procedure. Post Debridement Measurements: 1.1cm length x 1.2cm width x 0.2cm depth; 0.207cm^3 volume. Post debridement Stage noted as Category/Stage II. Character of Wound/Ulcer Post Debridement is stable. Post procedure Diagnosis Wound #3: Same as Pre-Procedure Wound #4 Pre-procedure diagnosis of Wound #4 is a Pressure Ulcer located on the Left Toe Fourth . There was a Skin/Subcutaneous Tissue Debridement ([id number]) debridement with total area of 1.92 sq cm performed by Ricard Dillon, MD. with the following instrument(s): Curette to remove Viable and Non-Viable tissue/material including Fibrin/Slough, Eschar, Callus, and Subcutaneous after achieving pain control using Other (lidocaine 4%). A time out was conducted at 01:57, prior to the start of the procedure. A Moderate amount of bleeding was controlled with Silver Nitrate. The procedure was tolerated well with a pain level of 0 throughout and a pain level of 0 following the procedure. Post Debridement Measurements: 1.6cm length x 1.2cm width x 0.3cm depth; 0.452cm^3 volume. Post debridement Stage noted as Category/Stage II. Character of Wound/Ulcer Post Debridement is stable. Kenneth [name], SCHLIEP. ([id number]) Post procedure Diagnosis Wound #4: Same as Pre-Procedure Wound #5 Pre-procedure diagnosis of Wound #5 is a Pressure Ulcer located on the Left Toe Fifth . There was a Skin/Subcutaneous Tissue Debridement ([id number]) debridement with total area of 4.96 sq cm performed  by Ricard Dillon, MD. with the following instrument(s): Curette to remove Viable and Non-Viable tissue/material including Fibrin/Slough, Callus, and Subcutaneous after achieving pain control using Other (lidocaine 4%). A time out was conducted at 01:57, prior to the start of the procedure. A Moderate amount of bleeding was controlled with Pressure. The procedure was tolerated well with a pain level of 0 throughout and a pain level of 0 following the procedure. Post Debridement Measurements: 3.1cm length x 1.6cm width x 0.2cm depth; 0.779cm^3 volume. Post debridement Stage noted as Category/Stage II. Character of Wound/Ulcer Post Debridement is stable. Post procedure Diagnosis Wound #5: Same as Pre-Procedure Plan Wound Cleansing: Wound #1 Left Toe Great: Clean wound with Normal Saline. Wound #2 Left Toe Second: Clean wound with Normal Saline. Wound #3 Left Toe Third: Clean wound  with Normal Saline. Wound #4 Left Toe Fourth: Clean wound with Normal Saline. Wound #5 Left Toe Fifth: Clean wound with Normal Saline. Skin Barriers/Peri-Wound Care: Other: - silver nitrate to great toe, fourth and fifth toe to stop bleeding Primary Wound Dressing: Wound #1 Left Toe Great: Santyl Ointment Wound #2 Left Toe Second: Santyl Ointment Wound #3 Left Toe Third: Santyl Ointment Wound #4 Left Toe Fourth: Santyl Ointment Wound #5 Left Toe Fifth: Santyl Ointment Secondary Dressing: Wound #1 Left Toe Great: ABD pad Gauze and Kerlix/Conform Wound #2 Left Toe Second: ABD pad Gauze and Kerlix/Conform Wound #3 Left Toe Third: ABD pad Gauze and Kerlix/Conform Wound #4 Left Toe Fourth: ABD pad Gauze and Kerlix/Conform Wound #5 Left Toe Fifth: Cosgriff, Vinal W. ([id number]) ABD pad Gauze and Kerlix/Conform Dressing Change Frequency: Wound #1 Left Toe Great: Change dressing every day. Wound #2 Left Toe Second: Change dressing every day. Wound #3 Left Toe Third: Change dressing every  day. Wound #4 Left Toe Fourth: Change dressing every day. Wound #5 Left Toe Fifth: Change dressing every day. Follow-up Appointments: Wound #1 Left Toe Great: Return Appointment in 1 week. Wound #2 Left Toe Second: Return Appointment in 1 week. Wound #3 Left Toe Third: Return Appointment in 1 week. Wound #4 Left Toe Fourth: Return Appointment in 1 week. Wound #5 Left Toe Fifth: Return Appointment in 1 week. Off-Loading: Wound #1 Left Toe Great: Open toe surgical shoe to: - darko off loading shoe for frontal offloading Wound #2 Left Toe Second: Open toe surgical shoe to: - darko off loading shoe for frontal offloading Wound #3 Left Toe Third: Open toe surgical shoe to: - darko off loading shoe for frontal offloading Wound #4 Left Toe Fourth: Open toe surgical shoe to: - darko off loading shoe for frontal offloading Wound #5 Left Toe Fifth: Open toe surgical shoe to: - darko off loading shoe for frontal offloading Radiology ordered were: X-ray, foot - left foot xray for fourth toe possible infection The following medication(s) was prescribed: Santyl topical 250 unit/gram ointment ointment topical to wounds daily starting 09/29/2017 o o #1 completely nonviable wounds on the tips of all of his toes. These have the appearance of a burn injury although the patient vehemently denies that this would be possible. I'm therefore thinking this was probably excess pressure in his foot where causing blisters that simply didn't heal. He arrived with completely nonviable surfaces overall of his toes. The worst of this is the plantar left third toe which had necrotic subcutaneous tissue that was removed. The rest of this was more superficial and the wounds actually cleaned up quite nicely with debridement. #2 I have E scribed Santyl ointment which will be changed daily. BERL, BONFANTI ([id number]) #3 gave him a Darco forefoot offloading sandal #4 x-ray of his left foot #5 I don't think he  requires any arterial studies Electronic Signature(s) Signed: 10/01/2017 9:37:23 AM By: Gretta Cool, BSN, RN, CWS, Kim RN, BSN Signed: 10/06/2017 5:00:32 PM By: Linton Ham MD Previous Signature: 09/29/2017 4:56:29 PM Version By: Linton Ham MD Entered By: Gretta Cool, BSN, RN, CWS, Kim on 10/01/2017 09:37:23 ENRICO, EADDY ([id number]) -------------------------------------------------------------------------------- ROS/PFSH Details Patient Name: Kenneth [name] Date of Service: 09/29/2017 12:30 PM Medical Record Number: [id number] Patient Account Number: [id number] Date of Birth/Sex: 27-Sep-1955 (61 y.o. Male) Treating RN: Roger Shelter Primary Care Provider: Di Kindle Other Clinician: Referring Provider: Di Kindle Treating Provider/Extender: Ricard Dillon Weeks in Treatment: 0 Wound History Do you currently have one or more  open woundso Yes How many open wounds do you currently haveo 5 Approximately how long have you had your woundso 1 month How have you been treating your wound(s) until nowo OTC cream Has your wound(s) ever healed and then re-openedo No Have you had any lab work done in the past montho Yes Have you tested positive for an antibiotic resistant organism (MRSA, VRE)o No Have you tested positive for osteomyelitis (bone infection)o No Have you had any tests for circulation on your legso Yes Constitutional Symptoms (General Health) Complaints and Symptoms: Negative for: Fatigue; Fever; Chills; Marked Weight Change Eyes Complaints and Symptoms: Positive for: Dry Eyes; Glasses / Contacts - glasses Negative for: Vision Changes Medical History: Positive for: Cataracts Respiratory Complaints and Symptoms: Negative for: Chronic or frequent coughs; Shortness of Breath Medical History: Negative for: Aspiration; Asthma; Chronic Obstructive Pulmonary Disease (COPD); Pneumothorax; Tuberculosis Cardiovascular Complaints and Symptoms: Negative for: Chest pain; LE edema Medical  History: Positive for: Hypertension; Myocardial Infarction Negative for: Angina; Arrhythmia; Congestive Heart Failure; Coronary Artery Disease; Deep Vein Thrombosis; Hypotension; Peripheral Arterial Disease; Peripheral Venous Disease; Phlebitis; Vasculitis Genitourinary Complaints and Symptoms: Positive for: Kidney failure/ Dialysis Immunological HADES, MATHEW. ([id number]) Complaints and Symptoms: Negative for: Hives; Itching Medical History: Negative for: Lupus Erythematosus; Raynaudos; Scleroderma Integumentary (Skin) Complaints and Symptoms: Positive for: Wounds; Bleeding or bruising tendency Negative for: Breakdown; Swelling Medical History: Negative for: History of Burn; History of pressure wounds Musculoskeletal Complaints and Symptoms: Negative for: Muscle Pain; Muscle Weakness Medical History: Positive for: Rheumatoid Arthritis Negative for: Gout; Osteoarthritis; Osteomyelitis Neurologic Complaints and Symptoms: Positive for: Numbness/parasthesias - feet Medical History: Negative for: Dementia; Neuropathy; Quadriplegia; Paraplegia; Seizure Disorder Psychiatric Complaints and Symptoms: Negative for: Anxiety; Claustrophobia Medical History: Negative for: Anorexia/bulimia; Confinement Anxiety Ear/Nose/Mouth/Throat Complaints and Symptoms: No Complaints or Symptoms Hematologic/Lymphatic Medical History: Positive for: Anemia Negative for: Hemophilia; Human Immunodeficiency Virus; Lymphedema; Sickle Cell Disease Gastrointestinal Complaints and Symptoms: No Complaints or Symptoms Medical History: Negative for: Cirrhosis ; Colitis; Crohnos; Hepatitis A; Hepatitis B; Hepatitis C Shilling, Naphtali W. ([id number]) Endocrine Medical History: Positive for: Type I Diabetes Time with diabetes: 30 years Treated with: Insulin Blood sugar tested every day: Yes Tested : 5 times Blood sugar testing results: Breakfast: 108 Oncologic Complaints and Symptoms: No Complaints or  Symptoms Medical History: Negative for: Received Chemotherapy; Received Radiation HBO Extended History Items Eyes: Cataracts Immunizations Pneumococcal Vaccine: Received Pneumococcal Vaccination: No Implantable Devices Hospitalization / Surgery History Name of Hospital Purpose of Hospitalization/Surgery Date [place] chest pain Family and Social History Never smoker; Marital Status - Married; Alcohol Use: Never; Drug Use: No History; Caffeine Use: Rarely; Financial Concerns: No; Food, Clothing or Shelter Needs: No; Support System Lacking: No; Transportation Concerns: No; Advanced Directives: No; Patient does not want information on Advanced Directives; Do not resuscitate: No; Living Will: No; Medical Power of Attorney: No Electronic Signature(s) Signed: 09/29/2017 4:51:06 PM By: Roger Shelter Signed: 09/29/2017 4:56:29 PM By: Linton Ham MD Entered By: Roger Shelter on 09/29/2017 13:28:21 SANFORD, LINDBLAD ([id number]) -------------------------------------------------------------------------------- SuperBill Details Patient Name: Kenneth [name] Date of Service: 09/29/2017 Medical Record Number: [id number] Patient Account Number: [id number] Date of Birth/Sex: 06/24/1956 (61 y.o. Male) Treating RN: Roger Shelter Primary Care Provider: Di Kindle Other Clinician: Referring Provider: Di Kindle Treating Provider/Extender: Ricard Dillon Weeks in Treatment: 0 Diagnosis Coding ICD-10 Codes Code Description E11.621 Type 2 diabetes mellitus with foot ulcer L97.528 Non-pressure chronic ulcer of other part of left foot with other specified severity E11.40 Type 2 diabetes mellitus  with diabetic neuropathy, unspecified Facility Procedures CPT4 Code Description: [id number] [place] VISIT-LEV 3 EST PT Modifier: Quantity: 1 CPT4 Code Description: [id number] 90301 - DEB SUBQ TISSUE 20 SQ CM/< ICD-10 Diagnosis Description L97.528 Non-pressure chronic ulcer of other part of left  foot with other Modifier: specified sever Quantity: 1 ity Physician Procedures CPT4 Code Description: [id number] 99213 - WC PHYS LEVEL 3 - [facility] ICD-10 Diagnosis Description 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

## 2017-09-30 NOTE — Progress Notes (Signed)
Kenneth [name], Kenneth [name] ([id number]) Visit Report for 09/29/2017 Abuse/Suicide Risk Screen Details Patient Name: Kenneth [name], Kenneth [name] Date of Service: 09/29/2017 12:30 PM Medical Record Number: [id number] Patient Account Number: [id number] Date of Birth/Sex: [birth year] (61 y.o. Male) Treating RN: Roger Shelter Primary Care Jacky Dross: Di Kindle Other Clinician: Referring Jahira Swiss: Referral, Self Treating Angeliyah Kirkey/Extender: Ricard Dillon Weeks in Treatment: 0 Abuse/Suicide Risk Screen Items Answer ABUSE/SUICIDE RISK SCREEN: Has anyone close to you tried to hurt or harm you recentlyo No Do you feel uncomfortable with anyone in your familyo No Has anyone forced you do things that you didnot want to doo No Do you have any thoughts of harming yourselfo No Patient displays signs or symptoms of abuse and/or neglect. No Electronic Signature(s) Signed: 09/29/2017 4:51:06 PM By: Roger Shelter Entered By: Roger Shelter on 09/29/2017 13:28:33 Coccia, Glee Arvin ([id number]) -------------------------------------------------------------------------------- Activities of Daily Living Details Patient Name: Kenneth [name] Date of Service: 09/29/2017 12:30 PM Medical Record Number: [id number] Patient Account Number: [id number] Date of Birth/Sex: 1956-08-27 (61 y.o. Male) Treating RN: Roger Shelter Primary Care Kiele Heavrin: Di Kindle Other Clinician: Referring Clayton Jarmon: Referral, Self Treating Rossi Burdo/Extender: Ricard Dillon Weeks in Treatment: 0 Activities of Daily Living Items Answer Activities of Daily Living (Please select one for each item) Drive Automobile Completely Able Take Medications Completely Able Use Telephone Completely Able Care for Appearance Completely Able Use Toilet Completely Able Bath / Shower Completely Able Dress Self Completely Able Feed Self Completely Able Walk Completely Able Get In / Out Bed Completely Able Housework Completely Able Prepare Meals Completely [place] for Self Completely Able Electronic Signature(s) Signed: 09/29/2017 4:51:06 PM By: Roger Shelter Entered By: Roger Shelter on 09/29/2017 13:28:54 Kenneth [name] ([id number]) -------------------------------------------------------------------------------- Education Assessment Details Patient Name: Kenneth [name] Date of Service: 09/29/2017 12:30 PM Medical Record Number: [id number] Patient Account Number: [id number] Date of Birth/Sex: [birth year] (61 y.o. 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 Memory Deficit: No Emotional Barrier: No Cultural/Religious Beliefs Affecting Medical Care: No Physical Barrier Assessment Impaired Vision: Yes Glasses Impaired Hearing: No Decreased Hand dexterity: No Knowledge/Comprehension Assessment Knowledge Level: High Comprehension Level: High Ability to understand written High instructions: Ability to understand verbal High instructions: Motivation Assessment Anxiety Level: Calm Cooperation: Cooperative Education Importance: Acknowledges Need Interest in Health Problems: Asks Questions Perception: Coherent Willingness to Engage in Self- High Management Activities: Readiness to Engage in Self- High Management Activities: Electronic Signature(s) Signed: 09/29/2017 4:51:06 PM By: Roger Shelter Entered By: Roger Shelter on 09/29/2017 13:29:32 MOUA, RASMUSSON ([id number]) -------------------------------------------------------------------------------- Fall  Risk Assessment Details Patient Name: Kenneth [name] Date of Service: 09/29/2017 12:30 PM Medical Record Number: [id number] Patient Account Number: [id number] Date of Birth/Sex: [birth year] (61 y.o. Male) Treating RN: Roger Shelter Primary Care Markeshia Giebel: Di Kindle Other Clinician: Referring Royce Stegman: Referral, Self Treating Lilyan Prete/Extender: Tito Dine in Treatment: 0 Fall Risk Assessment Items Have you had 2 or more falls in the last 12 monthso 0 No Have you had any fall that resulted in injury in the last 12 monthso 0 No FALL RISK ASSESSMENT: History of falling - immediate or within 3 months 0 No Secondary diagnosis 0 No Ambulatory aid None/bed rest/wheelchair/nurse 0 Yes Crutches/cane/walker  0 No Furniture 0 No IV Access/Saline Lock 0 No Gait/Training Normal/bed rest/immobile 0 Yes Weak 0 No Impaired 0 No Mental Status Oriented to own ability 0 Yes Electronic Signature(s) Signed: 09/29/2017 4:51:06 PM By: Roger Shelter Entered By: Roger Shelter on 09/29/2017 13:29:45 Hillmer, Glee Arvin ([id number]) -------------------------------------------------------------------------------- Foot Assessment Details Patient Name: Kenneth [name] Date of Service: 09/29/2017 12:30 PM Medical Record Number: [id number] Patient Account Number: [id number] Date of Birth/Sex: 1956-09-06 (61 y.o. Male) Treating RN: Roger Shelter Primary Care Laramie Gelles: Di Kindle Other Clinician: Referring Tacie Mccuistion: Referral, Self Treating Chasey Dull/Extender: Ricard Dillon Weeks in Treatment: 0 Foot Assessment Items Site Locations + = Sensation present, - = Sensation absent, C = Callus, U = Ulcer R = Redness, W = Warmth, M = Maceration, PU = Pre-ulcerative lesion F = Fissure, S = Swelling, D = Dryness Assessment Right: Left: Other Deformity: No No Prior Foot Ulcer: No No Prior Amputation: No No Charcot Joint: No No Ambulatory Status: Ambulatory Without Help Gait: Steady Electronic  Signature(s) Signed: 09/29/2017 4:51:06 PM By: Roger Shelter Entered By: Roger Shelter on 09/29/2017 13:30:51 Sheaffer, Glee Arvin ([id number]) -------------------------------------------------------------------------------- Nutrition Risk Assessment Details Patient Name: Kenneth [name] Date of Service: 09/29/2017 12:30 PM Medical Record Number: [id number] Patient Account Number: [id number] Date of Birth/Sex: [birth year] (61 y.o. Male) Treating RN: Roger Shelter Primary Care Jillana Selph: Di Kindle Other Clinician: Referring Eberardo Demello: Referral, Self Treating Daven Pinckney/Extender: Ricard Dillon Weeks in Treatment: 0 Height (in): 67 Weight (lbs): 150 Body Mass Index (BMI): 23.5 Nutrition Risk Assessment Items NUTRITION RISK SCREEN: I have an illness or condition that made me change the kind and/or amount of 0 No food I eat I eat fewer than two meals per day 0 No I eat few fruits and vegetables, or milk products 0 No I have three or more drinks of beer, liquor or wine almost every day 0 No I have tooth or mouth problems that make it hard for me to eat 0 No I don't always have enough money to buy the food I need 0 No I eat alone most of the time 0 No I take three or more different prescribed or over-the-counter drugs a day 0 No Without wanting to, I have lost or gained 10 pounds in the last six months 0 No I am not always physically able to shop, cook and/or feed myself 0 No Nutrition Protocols Good Risk Protocol 0 No interventions needed Moderate Risk Protocol Electronic Signature(s) Signed: 09/29/2017 4:51:06 PM By: Roger Shelter Entered By: Roger Shelter on 09/29/2017 13:30:35

## 2017-09-30 NOTE — Progress Notes (Signed)
Kenneth, [name] ([id number]) Visit Report for 09/29/2017 Allergy List Details Patient Name: Kenneth [name], Kenneth [name] Date of Service: 09/29/2017 12:30 PM Medical Record Number: [id number] Patient Account Number: [id number] Date of Birth/Sex: 1956/05/17 (62 y.o. Male) Treating RN: Roger Shelter Primary Care Dequan Kindred: Di Kindle Other Clinician: Referring Jazzmyne Rasnick: Referral, Self Treating Debie Ashline/Extender: Ricard Dillon Weeks in Treatment: 0 Allergies Active Allergies No Known Drug Allergies Allergy Notes Electronic Signature(s) Signed: 09/29/2017 4:51:06 PM By: Roger Shelter Entered By: Roger Shelter on 09/29/2017 13:15:33 Eldridge, Glee Arvin ([id number]) -------------------------------------------------------------------------------- Arrival Information Details Patient Name: Kenneth [name] Date of Service: 09/29/2017 12:30 PM Medical Record Number: [id number] Patient Account Number: [id number] Date of Birth/Sex: 07-09-1956 (62 y.o. Male) Treating RN: Roger Shelter Primary Care Yacine Garriga: Di Kindle Other Clinician: Referring Quita Mcgrory: Referral, Self Treating Danelly Hassinger/Extender: Tito Dine in Treatment: 0 Visit Information Patient Arrived: Ambulatory Arrival Time: 12:46 Accompanied By: wife Transfer Assistance: None Patient Identification Verified: Yes Secondary Verification Process Completed: Yes Patient Has Alerts: Yes Patient Alerts: ABI RIght- 0.96 ABI Left- 0.99 Electronic Signature(s) Signed: 09/29/2017 4:51:06 PM By: Roger Shelter Entered By: Roger Shelter on 09/29/2017 12:50:11 Darty, Glee Arvin ([id number]) -------------------------------------------------------------------------------- Clinic Level of Care Assessment Details Patient Name: Kenneth [name] Date of Service: 09/29/2017 12:30 PM Medical Record Number: [id number] Patient Account Number: [id number] Date of Birth/Sex: 10/05/1955 (62 y.o. Male) Treating RN: Roger Shelter Primary Care Adleigh Mcmasters: Di Kindle Other Clinician: Referring Diahann Guajardo: Referral, Self Treating Hasani Diemer/Extender: Tito Dine in Treatment: 0 Clinic Level of Care Assessment Items TOOL 1 Quantity Score X - Use when EandM and Procedure is performed on INITIAL visit 1 0 ASSESSMENTS - Nursing Assessment / Reassessment X - General Physical Exam (combine w/ comprehensive assessment (listed just below) when 1 20 performed on new pt. evals) X- 1 25 Comprehensive Assessment (HX, ROS, Risk Assessments, Wounds Hx, etc.) ASSESSMENTS - Wound and Skin Assessment / Reassessment []  - Dermatologic / Skin Assessment (not related to wound area) 0 ASSESSMENTS - Ostomy and/or Continence Assessment and Care []  - Incontinence Assessment and Management 0 []  - 0 Ostomy Care Assessment and Management (repouching, etc.) PROCESS - Coordination of Care []  - Simple Patient / Family Education for ongoing care 0 X- 1 20 Complex (extensive) Patient / Family Education for ongoing care []  - 0 Staff obtains Programmer, systems, Records, Test Results / Process Orders []  - 0 Staff telephones HHA, Nursing Homes / Clarify orders / etc []  - 0 Routine Transfer to another Facility (non-emergent condition) []  - 0 Routine Hospital Admission (non-emergent condition) []  - 0 New Admissions / Biomedical engineer / Ordering NPWT, Apligraf, etc. []  - 0 Emergency Hospital Admission (emergent condition) PROCESS - Special Needs []  - Pediatric / Minor Patient Management 0 []  - 0 Isolation Patient Management []  - 0 Hearing / Language / Visual special needs []  - 0 Assessment of Community assistance (transportation, D/C planning, etc.) []  - 0 Additional assistance / Altered mentation []  - 0 Support Surface(s) Assessment (bed, cushion, seat, etc.) Dobosz, Levander W. ([id number]) INTERVENTIONS - Miscellaneous []  - External ear exam 0 []  - 0 Patient Transfer (multiple staff / Civil Service fast streamer / Similar devices) []  - 0 Simple Staple / Suture removal (25 or  less) []  - 0 Complex Staple / Suture removal (26 or more) []  - 0 Hypo/Hyperglycemic Management (do not check if billed separately) X- 1 15 Ankle / Brachial Index (ABI) - do not check if billed separately Has the patient been seen at the hospital within the last three  years: Yes Total Score: 80 Level Of Care: New/Established - Level 3 Electronic Signature(s) Signed: 09/29/2017 4:51:06 PM By: Roger Shelter Entered By: Roger Shelter on 09/29/2017 14:40:24 Klomp, Glee Arvin ([id number]) -------------------------------------------------------------------------------- Encounter Discharge Information Details Patient Name: Kenneth [name] Date of Service: 09/29/2017 12:30 PM Medical Record Number: [id number] Patient Account Number: [id number] Date of Birth/Sex: [birth year] (62 y.o. Male) Treating RN: Roger Shelter Primary Care Krystie Leiter: Di Kindle Other Clinician: Referring Abrahim Sargent: Referral, Self Treating Kamilah Correia/Extender: Tito Dine in Treatment: 0 Encounter Discharge Information Items Schedule Follow-up Appointment: No Medication Reconciliation completed and No provided to Patient/Care Felita Bump: Patient Clinical Summary of Care: Declined Electronic Signature(s) Signed: 09/30/2017 8:30:26 AM By: Ruthine Dose Entered By: Ruthine Dose on 09/29/2017 14:38:22 Severino, Glee Arvin ([id number]) -------------------------------------------------------------------------------- Lower Extremity Assessment Details Patient Name: Kenneth [name] Date of Service: 09/29/2017 12:30 PM Medical Record Number: [id number] Patient Account Number: [id number] Date of Birth/Sex: Mar 11, 1956 (62 y.o. Male) Treating RN: Roger Shelter Primary Care Jaymeson Mengel: Di Kindle Other Clinician: Referring Lacora Folmer: Referral, Self Treating Ruhee Enck/Extender: Ricard Dillon Weeks in Treatment: 0 Edema Assessment Assessed: [Left: No] [Right: No] Edema: [Left: N] [Right: o] Vascular  Assessment Claudication: Claudication Assessment [Left:None] Pulses: Dorsalis Pedis Palpable: [Left:Yes] Posterior Tibial Extremity colors, hair growth, and conditions: Extremity Color: [Left:Normal] Hair Growth on Extremity: [Left:Yes] Temperature of Extremity: [Left:Warm] Capillary Refill: [Left:< 3 seconds] Toe Nail Assessment Left: Right: Thick: No Discolored: No Deformed: No Improper Length and Hygiene: No Electronic Signature(s) Signed: 09/29/2017 4:51:06 PM By: Roger Shelter Entered By: Roger Shelter on 09/29/2017 13:15:18 Meyering, Glee Arvin ([id number]) -------------------------------------------------------------------------------- Multi Wound Chart Details Patient Name: Kenneth [name] Date of Service: 09/29/2017 12:30 PM Medical Record Number: [id number] Patient Account Number: [id number] Date of Birth/Sex: [birth year] (62 y.o. Male) Treating RN: Roger Shelter Primary Care Ramir Malerba: Di Kindle Other Clinician: Referring Daxon Kyne: Referral, Self Treating Genesys Coggeshall/Extender: Ricard Dillon Weeks in Treatment: 0 Vital Signs Height(in): 67 Pulse(bpm): 80 Weight(lbs): 150 Blood Pressure(mmHg): 144/54 Body Mass Index(BMI): 23 Temperature(F): 98.4 Respiratory Rate 18 (breaths/min): Photos: [1:No Photos] [2:No Photos] [3:No Photos] Wound Location: [1:Left Toe Great] [2:Left Toe Second] [3:Left Toe Third] Wounding Event: [1:Gradually Appeared] [2:Gradually Appeared] [3:Gradually Appeared] Primary Etiology: [1:Pressure Ulcer] [2:Pressure Ulcer] [3:Pressure Ulcer] Date Acquired: [1:08/29/2017] [2:08/29/2017] [3:08/30/2017] Weeks of Treatment: [1:0] [2:0] [3:0] Wound Status: [1:Open] [2:Open] [3:Open] Measurements L x W x D [1:2.5x2.2x0.1] [2:0.5x0.6x0.1] [3:1.1x1.2x0.1] (cm) Area (cm) : [1:4.32] [2:0.236] [3:1.037] Volume (cm) : [1:0.432] [2:0.024] [3:0.104] Classification: [1:Category/Stage II] [2:Category/Stage II] [3:Category/Stage II] Exudate Amount:  [1:Medium] [2:None Present] [3:None Present] Exudate Type: [1:Serosanguineous] [2:N/A] [3:N/A] Exudate Color: [1:red, brown] [2:N/A] [3:N/A] Wound Margin: [1:Flat and Intact] [2:Flat and Intact] [3:Flat and Intact] Granulation Amount: [1:Large (67-100%)] [2:Large (67-100%)] [3:Large (67-100%)] Granulation Quality: [1:Pink] [2:N/A] [3:N/A] Necrotic Amount: [1:Small (1-33%)] [2:Small (1-33%)] [3:Small (1-33%)] Necrotic Tissue: [1:Adherent Slough] [2:Eschar] [3:Eschar] Exposed Structures: [1:Fascia: No Fat Layer (Subcutaneous Tissue) Exposed: No Tendon: No Muscle: No Joint: No Bone: No] [2:Fascia: No Fat Layer (Subcutaneous Tissue) Exposed: No Tendon: No Muscle: No Joint: No Bone: No] [3:Fascia: No Fat Layer (Subcutaneous Tissue) Exposed:  No Tendon: No Muscle: No Joint: No Bone: No] Epithelialization: [1:None] [2:Large (67-100%)] [3:N/A] Periwound Skin Texture: [1:Callus: Yes Scarring: Yes Excoriation: No Induration: No Crepitus: No Rash: No] [2:Callus: Yes Excoriation: No Induration: No Crepitus: No Rash: No Scarring: No] [3:Callus: Yes Scarring: Yes Excoriation: No Induration: No Crepitus: No Rash: No] Periwound Skin Moisture: [1:Maceration: No Dry/Scaly: No] [2:Maceration: No Dry/Scaly: No] [3:Maceration: No Dry/Scaly: No] Periwound Skin Color: Cheaney, Glee Arvin ([id number]) Atrophie  Blanche: No Atrophie Blanche: No Atrophie Blanche: No Cyanosis: No Cyanosis: No Cyanosis: No Ecchymosis: No Ecchymosis: No Ecchymosis: No Erythema: No Erythema: No Erythema: No Hemosiderin Staining: No Hemosiderin Staining: No Hemosiderin Staining: No Mottled: No Mottled: No Mottled: No Pallor: No Pallor: No Pallor: No Rubor: No Rubor: No Rubor: No Temperature: N/A N/A No Abnormality Tenderness on Palpation: No No Yes Wound Preparation: Ulcer Cleansing: Ulcer Cleansing: Ulcer Cleansing: Rinsed/Irrigated with Saline Rinsed/Irrigated with Saline Rinsed/Irrigated with Saline Topical Anesthetic  Applied: Topical Anesthetic Applied: Topical Anesthetic Applied: Other: lidocaine 4% Other: lidocaine 4% Other: lidocaine 4% Wound Number: 4 5 N/A Photos: No Photos No Photos N/A Wound Location: Left Toe Fourth Left Toe Fifth N/A Wounding Event: Gradually Appeared Gradually Appeared N/A Primary Etiology: Pressure Ulcer Pressure Ulcer N/A Date Acquired: 08/30/2017 08/30/2017 N/A Weeks of Treatment: 0 0 N/A Wound Status: Open Open N/A Measurements L x W x D 1.6x1.2x0.1 3.1x1.6x0.1 N/A (cm) Area (cm) : 1.508 3.896 N/A Volume (cm) : 0.151 0.39 N/A Classification: Category/Stage II Category/Stage II N/A Exudate Amount: Medium Medium N/A Exudate Type: Serosanguineous Serosanguineous N/A Exudate Color: red, brown red, brown N/A Wound Margin: Flat and Intact Flat and Intact N/A Granulation Amount: Medium (34-66%) Medium (34-66%) N/A Granulation Quality: Red Red N/A Necrotic Amount: Medium (34-66%) Medium (34-66%) N/A Necrotic Tissue: Eschar, Adherent Slough Eschar, Adherent Slough N/A Exposed Structures: Fat Layer (Subcutaneous Fat Layer (Subcutaneous N/A Tissue) Exposed: Yes Tissue) Exposed: Yes Fascia: No Fascia: No Tendon: No Tendon: No Muscle: No Muscle: No Joint: No Joint: No Bone: No Bone: No Epithelialization: Small (1-33%) Small (1-33%) N/A Periwound Skin Texture: Callus: Yes Induration: Yes N/A Scarring: Yes Callus: Yes Excoriation: No Scarring: Yes Induration: No Excoriation: No Crepitus: No Crepitus: No Rash: No Rash: No Periwound Skin Moisture: Maceration: No Maceration: No N/A Dry/Scaly: No Dry/Scaly: No Periwound Skin Color: Atrophie Blanche: No Atrophie Blanche: No N/A Cyanosis: No Cyanosis: No Ecchymosis: No Ecchymosis: No Erythema: No Erythema: No Hemosiderin Staining: No Hemosiderin Staining: No Cumberledge, Claus W. ([id number]) Mottled: No Mottled: No Pallor: No Pallor: No Rubor: No Rubor: No Temperature: N/A No Abnormality  N/A Tenderness on Palpation: No No N/A Wound Preparation: Ulcer Cleansing: Ulcer Cleansing: N/A Rinsed/Irrigated with Saline Rinsed/Irrigated with Saline Topical Anesthetic Applied: Topical Anesthetic Applied: Other: lidocaine 4% Other: lidocaine 4% Treatment Notes Electronic Signature(s) Signed: 09/29/2017 4:51:06 PM By: Roger Shelter Entered By: Roger Shelter on 09/29/2017 13:32:26 HAYDYN, GIRVAN ([id number]) -------------------------------------------------------------------------------- [place] Details Patient Name: Kenneth [name] Date of Service: 09/29/2017 12:30 PM Medical Record Number: [id number] Patient Account Number: [id number] Date of Birth/Sex: 10-31-1955 (62 y.o. Male) Treating RN: Roger Shelter Primary Care Jla Reynolds: Di Kindle Other Clinician: Referring Perpetua Elling: Referral, Self Treating Fernando Stoiber/Extender: Tito Dine in Treatment: 0 Active Inactive ` Orientation to the Wound Care Program Nursing Diagnoses: Knowledge deficit related to the wound healing center program Goals: Patient/caregiver will verbalize understanding of the [place] Program Date Initiated: 09/29/2017 Target Resolution Date: 10/19/2017 Goal Status: Active Interventions: Provide education on orientation to the wound center Notes: ` Wound/Skin Impairment Nursing Diagnoses: Impaired tissue integrity Goals: Patient/caregiver will verbalize understanding of skin care regimen Date Initiated: 09/29/2017 Target Resolution Date: 10/19/2017 Goal Status: Active Ulcer/skin breakdown will have a volume reduction of 30% by week 4 Date Initiated: 09/29/2017 Target Resolution Date: 10/19/2017 Goal Status: Active Interventions: Assess ulceration(s) every visit Treatment Activities: Skin care regimen initiated : 09/29/2017 Notes: Electronic Signature(s) Signed: 09/29/2017 4:51:06 PM By: Roger Shelter Entered By: Roger Shelter on 09/29/2017  13:32:02 OCTAVE, MONTROSE ([id number]) -------------------------------------------------------------------------------- Pain Assessment Details Patient Name: GHAZI, RUMPF Date of Service: 09/29/2017 12:30 PM Medical Record Number: [id number] Patient Account Number: [id number] Date of Birth/Sex: 12-26-1955 (62 y.o. Male) Treating RN: Roger Shelter Primary Care Christopherjame Carnell: Di Kindle Other Clinician: Referring Tiasha Helvie: Referral, Self Treating Princes Finger/Extender: Ricard Dillon Weeks in Treatment: 0 Active Problems Location of Pain Severity and Description of Pain Patient Has Paino No Site Locations Pain Management and Medication Current Pain Management: Electronic Signature(s) Signed: 09/29/2017 4:51:06 PM By: Roger Shelter Entered By: Roger Shelter on 09/29/2017 12:50:18 Kenneth [name] ([id number]) -------------------------------------------------------------------------------- Patient/Caregiver Education Details Patient Name: Kenneth [name] Date of Service: 09/29/2017 12:30 PM Medical Record Number: [id number] Patient Account Number: [id number] Date of Birth/Gender: 08-Jun-1956 (62 y.o. Male) Treating RN: Roger Shelter Primary Care Physician: Di Kindle Other Clinician: Referring Physician: Referral, Self Treating Physician/Extender: Tito Dine in Treatment: 0 Education Assessment Education Provided To: Patient and Caregiver Education Topics Provided Welcome To [facility]: Handouts: Welcome To [facility] , Other: koren interpreter Methods: Explain/Verbal Responses: State content correctly Wound Debridement: Handouts: Wound Debridement, Other: koren interpreter Methods: Explain/Verbal Responses: State content correctly Wound/Skin Impairment: Handouts: Other: [name] Methods: Explain/Verbal Responses: State content correctly Electronic Signature(s) Signed: 09/29/2017 4:51:06 PM By: Roger Shelter Entered By: Roger Shelter on 09/29/2017 Bendon, Glee Arvin ([id number]) -------------------------------------------------------------------------------- Wound Assessment Details Patient Name: Kenneth [name] Date of Service: 09/29/2017 12:30 PM Medical Record Number: [id number] Patient Account Number: [id number] Date of Birth/Sex: 1956-07-07 (62 y.o. Male) Treating RN: Roger Shelter Primary Care Jessee Mezera: Di Kindle Other Clinician: Referring Aarna Mihalko: Referral, Self Treating Saman Giddens/Extender: Ricard Dillon Weeks in Treatment: 0 Wound Status Wound Number: 1 Primary Etiology: Pressure Ulcer Wound Location: Left Toe Great Wound Status: Open Wounding Event: Gradually Appeared Date Acquired: 08/29/2017 Weeks Of Treatment: 0 Clustered Wound: No Photos Photo Uploaded By: Roger Shelter on 09/29/2017 16:45:09 Wound Measurements Length: (cm) 2.5 Width: (cm) 2.2 Depth: (cm) 0.1 Area: (cm) 4.32 Volume: (cm) 0.432 % Reduction in Area: % Reduction in Volume: Epithelialization: None Tunneling: No Undermining: No Wound Description Classification: Category/Stage II Wound Margin: Flat and Intact Exudate Amount: Medium Exudate Type: Serosanguineous Exudate Color: red, brown Foul Odor After Cleansing: No Slough/Fibrino Yes Wound Bed Granulation Amount: Large (67-100%) Exposed Structure Granulation Quality: Pink Fascia Exposed: No Necrotic Amount: Small (1-33%) Fat Layer (Subcutaneous Tissue) Exposed: No Necrotic Quality: Adherent Slough Tendon Exposed: No Muscle Exposed: No Joint Exposed: No Bone Exposed: No Periwound Skin Texture Schroll, Oryan W. ([id number]) Texture Color No Abnormalities Noted: No No Abnormalities Noted: No Callus: Yes Atrophie Blanche: No Crepitus: No Cyanosis: No Excoriation: No Ecchymosis: No Induration: No Erythema: No Rash: No Hemosiderin Staining: No Scarring: Yes Mottled: No Pallor: No Moisture Rubor: No No Abnormalities Noted: No Dry / Scaly:  No Maceration: No Wound Preparation Ulcer Cleansing: Rinsed/Irrigated with Saline Topical Anesthetic Applied: Other: lidocaine 4%, Treatment Notes Wound #1 (Left Toe Great) 1. Cleansed with: Clean wound with Normal Saline 4. Dressing Applied: Santyl Ointment 5. Secondary Dressing Applied Guaze, ABD and kerlix/Conform Notes silver nitrate on great toe, fourth and fifth toe for bleeding Electronic Signature(s) Signed: 09/29/2017 4:51:06 PM By: Roger Shelter Entered By: Roger Shelter on 09/29/2017 13:01:33 Lattin, Glee Arvin ([id number]) -------------------------------------------------------------------------------- Wound Assessment Details Patient Name: Kenneth [name] Date of Service: 09/29/2017 12:30 PM Medical Record Number: [id number] Patient Account Number: [id number] Date of Birth/Sex: 1955/11/10 (62 y.o. Male) Treating RN: Roger Shelter Primary Care Alden Bensinger: Di Kindle  Other Clinician: Referring Vanette Noguchi: Referral, Self Treating Habib Kise/Extender: Ricard Dillon Weeks in Treatment: 0 Wound Status Wound Number: 2 Primary Etiology: Pressure Ulcer Wound Location: Left Toe Second Wound Status: Open Wounding Event: Gradually Appeared Date Acquired: 08/29/2017 Weeks Of Treatment: 0 Clustered Wound: No Photos Photo Uploaded By: Roger Shelter on 09/29/2017 16:45:10 Wound Measurements Length: (cm) 0.5 Width: (cm) 0.6 Depth: (cm) 0.1 Area: (cm) 0.236 Volume: (cm) 0.024 % Reduction in Area: % Reduction in Volume: Epithelialization: Large (67-100%) Tunneling: No Undermining: No Wound Description Classification: Category/Stage II Wound Margin: Flat and Intact Exudate Amount: None Present Wound Bed Granulation Amount: Large (67-100%) Exposed Structure Necrotic Amount: Small (1-33%) Fascia Exposed: No Necrotic Quality: Eschar Fat Layer (Subcutaneous Tissue) Exposed: No Tendon Exposed: No Muscle Exposed: No Joint Exposed: No Bone Exposed: No Periwound  Skin Texture Texture Color No Abnormalities Noted: No No Abnormalities Noted: No Winnick, Woodard W. ([id number]) Callus: Yes Atrophie Blanche: No Crepitus: No Cyanosis: No Excoriation: No Ecchymosis: No Induration: No Erythema: No Rash: No Hemosiderin Staining: No Scarring: No Mottled: No Pallor: No Moisture Rubor: No No Abnormalities Noted: No Dry / Scaly: No Maceration: No Wound Preparation Ulcer Cleansing: Rinsed/Irrigated with Saline Topical Anesthetic Applied: Other: lidocaine 4%, Treatment Notes Wound #2 (Left Toe Second) 1. Cleansed with: Clean wound with Normal Saline 4. Dressing Applied: Santyl Ointment 5. Secondary Dressing Applied Guaze, ABD and kerlix/Conform Notes silver nitrate on great toe, fourth and fifth toe for bleeding Electronic Signature(s) Signed: 09/29/2017 4:51:06 PM By: Roger Shelter Entered By: Roger Shelter on 09/29/2017 13:05:42 Oberle, Glee Arvin ([id number]) -------------------------------------------------------------------------------- Wound Assessment Details Patient Name: Kenneth [name] Date of Service: 09/29/2017 12:30 PM Medical Record Number: [id number] Patient Account Number: [id number] Date of Birth/Sex: Mar 21, 1956 (62 y.o. Male) Treating RN: Roger Shelter Primary Care Kayren Holck: Di Kindle Other Clinician: Referring Kimothy Kishimoto: Referral, Self Treating Georgie Haque/Extender: Ricard Dillon Weeks in Treatment: 0 Wound Status Wound Number: 3 Primary Etiology: Pressure Ulcer Wound Location: Left Toe Third Wound Status: Open Wounding Event: Gradually Appeared Date Acquired: 08/30/2017 Weeks Of Treatment: 0 Clustered Wound: No Photos Photo Uploaded By: Roger Shelter on 09/29/2017 16:45:48 Wound Measurements Length: (cm) 1.1 % Re Width: (cm) 1.2 % Re Depth: (cm) 0.1 Area: (cm) 1.037 Volume: (cm) 0.104 duction in Area: duction in Volume: Wound Description Classification: Category/Stage II Wound Margin: Flat and  Intact Exudate Amount: None Present Foul Odor After Cleansing: No Slough/Fibrino Yes Wound Bed Granulation Amount: Large (67-100%) Exposed Structure Necrotic Amount: Small (1-33%) Fascia Exposed: No Necrotic Quality: Eschar Fat Layer (Subcutaneous Tissue) Exposed: No Tendon Exposed: No Muscle Exposed: No Joint Exposed: No Bone Exposed: No Periwound Skin Texture Texture Color No Abnormalities Noted: No No Abnormalities Noted: No Ned, Shadrack W. ([id number]) Callus: Yes Atrophie Blanche: No Crepitus: No Cyanosis: No Excoriation: No Ecchymosis: No Induration: No Erythema: No Rash: No Hemosiderin Staining: No Scarring: Yes Mottled: No Pallor: No Moisture Rubor: No No Abnormalities Noted: No Dry / Scaly: No Temperature / Pain Maceration: No Temperature: No Abnormality Tenderness on Palpation: Yes Wound Preparation Ulcer Cleansing: Rinsed/Irrigated with Saline Topical Anesthetic Applied: Other: lidocaine 4%, Treatment Notes Wound #3 (Left Toe Third) 1. Cleansed with: Clean wound with Normal Saline 4. Dressing Applied: Santyl Ointment 5. Secondary Dressing Applied Guaze, ABD and kerlix/Conform Notes silver nitrate on great toe, fourth and fifth toe for bleeding Electronic Signature(s) Signed: 09/29/2017 4:51:06 PM By: Roger Shelter Entered By: Roger Shelter on 09/29/2017 13:08:06 Hy, Glee Arvin ([id number]) -------------------------------------------------------------------------------- Wound Assessment Details Patient Name: Kenneth [name]. Date of  Service: 09/29/2017 12:30 PM Medical Record Number: [id number] Patient Account Number: [id number] Date of Birth/Sex: [birth year] (62 y.o. Male) Treating RN: Roger Shelter Primary Care Jackob [place]: Di Kindle Other Clinician: Referring Syreeta Figler: Referral, Self Treating Caidyn Blossom/Extender: Ricard Dillon Weeks in Treatment: 0 Wound Status Wound Number: 4 Primary Etiology: Pressure Ulcer Wound Location: Left Toe  Fourth Wound Status: Open Wounding Event: Gradually Appeared Date Acquired: 08/30/2017 Weeks Of Treatment: 0 Clustered Wound: No Photos Photo Uploaded By: Roger Shelter on 09/29/2017 16:45:49 Wound Measurements Length: (cm) 1.6 Width: (cm) 1.2 Depth: (cm) 0.1 Area: (cm) 1.508 Volume: (cm) 0.151 % Reduction in Area: % Reduction in Volume: Epithelialization: Small (1-33%) Tunneling: No Undermining: No Wound Description Classification: Category/Stage II Wound Margin: Flat and Intact Exudate Amount: Medium Exudate Type: Serosanguineous Exudate Color: red, brown Foul Odor After Cleansing: No Slough/Fibrino Yes Wound Bed Granulation Amount: Medium (34-66%) Exposed Structure Granulation Quality: Red Fascia Exposed: No Necrotic Amount: Medium (34-66%) Fat Layer (Subcutaneous Tissue) Exposed: Yes Necrotic Quality: Eschar, Adherent Slough Tendon Exposed: No Muscle Exposed: No Joint Exposed: No Bone Exposed: No Periwound Skin Texture Heidelberger, Rylei W. ([id number]) Texture Color No Abnormalities Noted: No No Abnormalities Noted: No Callus: Yes Atrophie Blanche: No Crepitus: No Cyanosis: No Excoriation: No Ecchymosis: No Induration: No Erythema: No Rash: No Hemosiderin Staining: No Scarring: Yes Mottled: No Pallor: No Moisture Rubor: No No Abnormalities Noted: No Dry / Scaly: No Maceration: No Wound Preparation Ulcer Cleansing: Rinsed/Irrigated with Saline Topical Anesthetic Applied: Other: lidocaine 4%, Treatment Notes Wound #4 (Left Toe Fourth) 1. Cleansed with: Clean wound with Normal Saline 4. Dressing Applied: Santyl Ointment 5. Secondary Dressing Applied Guaze, ABD and kerlix/Conform Notes silver nitrate on great toe, fourth and fifth toe for bleeding Electronic Signature(s) Signed: 09/29/2017 4:51:06 PM By: Roger Shelter Entered By: Roger Shelter on 09/29/2017 13:09:44 Klemmer, Glee Arvin  ([id number]) -------------------------------------------------------------------------------- Wound Assessment Details Patient Name: Kenneth [name] Date of Service: 09/29/2017 12:30 PM Medical Record Number: [id number] Patient Account Number: [id number] Date of Birth/Sex: July 05, 1956 (62 y.o. Male) Treating RN: Roger Shelter Primary Care Staton Markey: Di Kindle Other Clinician: Referring Anvay Tennis: Referral, Self Treating Kimbery Harwood/Extender: Ricard Dillon Weeks in Treatment: 0 Wound Status Wound Number: 5 Primary Etiology: Pressure Ulcer Wound Location: Left Toe Fifth Wound Status: Open Wounding Event: Gradually Appeared Date Acquired: 08/30/2017 Weeks Of Treatment: 0 Clustered Wound: No Photos Photo Uploaded By: Roger Shelter on 09/29/2017 16:46:14 Wound Measurements Length: (cm) 3.1 Width: (cm) 1.6 Depth: (cm) 0.1 Area: (cm) 3.896 Volume: (cm) 0.39 % Reduction in Area: % Reduction in Volume: Epithelialization: Small (1-33%) Tunneling: No Undermining: No Wound Description Classification: Category/Stage II Wound Margin: Flat and Intact Exudate Amount: Medium Exudate Type: Serosanguineous Exudate Color: red, brown Foul Odor After Cleansing: No Slough/Fibrino Yes Wound Bed Granulation Amount: Medium (34-66%) Exposed Structure Granulation Quality: Red Fascia Exposed: No Necrotic Amount: Medium (34-66%) Fat Layer (Subcutaneous Tissue) Exposed: Yes Necrotic Quality: Eschar, Adherent Slough Tendon Exposed: No Muscle Exposed: No Joint Exposed: No Bone Exposed: No Periwound Skin Texture Grudzinski, Augustine W. ([id number]) Texture Color No Abnormalities Noted: No No Abnormalities Noted: No Callus: Yes Atrophie Blanche: No Crepitus: No Cyanosis: No Excoriation: No Ecchymosis: No Induration: Yes Erythema: No Rash: No Hemosiderin Staining: No Scarring: Yes Mottled: No Pallor: No Moisture Rubor: No No Abnormalities Noted: No Dry / Scaly: No Temperature /  Pain Maceration: No Temperature: No Abnormality Wound Preparation Ulcer Cleansing: Rinsed/Irrigated with Saline Topical Anesthetic Applied: Other: lidocaine 4%, Treatment Notes Wound #5 (Left Toe Fifth) 1. Cleansed with: Clean wound  with Normal Saline 4. Dressing Applied: Santyl Ointment 5. Secondary Dressing Applied Guaze, ABD and kerlix/Conform Notes silver nitrate on great toe, fourth and fifth toe for bleeding Electronic Signature(s) Signed: 09/29/2017 4:51:06 PM By: Roger Shelter Entered By: Roger Shelter on 09/29/2017 13:12:30 Mitchner, Glee Arvin ([id number]) -------------------------------------------------------------------------------- Vitals Details Patient Name: Kenneth [name] Date of Service: 09/29/2017 12:30 PM Medical Record Number: [id number] Patient Account Number: [id number] Date of Birth/Sex: Dec 24, 1955 (62 y.o. Male) Treating RN: Roger Shelter Primary Care Zanylah Hardie: Di Kindle Other Clinician: Referring Danique Hartsough: Referral, Self Treating Syndey Jaskolski/Extender: Ricard Dillon Weeks in Treatment: 0 Vital Signs Time Taken: 12:51 Temperature (F): 98.4 Height (in): 67 Pulse (bpm): 80 Source: Stated Respiratory Rate (breaths/min): 18 Weight (lbs): 150 Blood Pressure (mmHg): 144/54 Source: Measured Reference Range: 80 - 120 mg / dl Body Mass Index (BMI): 23.5 Electronic Signature(s) Signed: 09/29/2017 4:51:06 PM By: Roger Shelter Entered By: Roger Shelter on 09/29/2017 12:52:09

## 2017-10-01 ENCOUNTER — Encounter (HOSPITAL_COMMUNITY): Payer: Medicare Other

## 2017-10-04 ENCOUNTER — Encounter (HOSPITAL_COMMUNITY): Payer: Medicare Other

## 2017-10-04 ENCOUNTER — Encounter (HOSPITAL_BASED_OUTPATIENT_CLINIC_OR_DEPARTMENT_OTHER): Payer: Medicare Other

## 2017-10-05 ENCOUNTER — Ambulatory Visit (HOSPITAL_COMMUNITY)
Admission: RE | Admit: 2017-10-05 | Discharge: 2017-10-05 | Disposition: A | Discharge status: Home / Self Care | Payer: Medicare Other | Source: Ambulatory Visit | Attending: Cardiology | Admitting: Cardiology

## 2017-10-05 ENCOUNTER — Encounter (HOSPITAL_COMMUNITY): Payer: Self-pay | Admitting: Cardiology

## 2017-10-05 ENCOUNTER — Other Ambulatory Visit: Payer: Self-pay

## 2017-10-05 VITALS — BP 159/64 | HR 77 | Wt 152.2 lb

## 2017-10-05 DIAGNOSIS — N186 End stage renal disease: Secondary | ICD-10-CM | POA: Diagnosis not present

## 2017-10-05 DIAGNOSIS — I214 Non-ST elevation (NSTEMI) myocardial infarction: Secondary | ICD-10-CM | POA: Diagnosis not present

## 2017-10-05 DIAGNOSIS — I255 Ischemic cardiomyopathy: Secondary | ICD-10-CM | POA: Diagnosis not present

## 2017-10-05 DIAGNOSIS — Z951 Presence of aortocoronary bypass graft: Secondary | ICD-10-CM | POA: Diagnosis not present

## 2017-10-05 DIAGNOSIS — E1122 Type 2 diabetes mellitus with diabetic chronic kidney disease: Secondary | ICD-10-CM | POA: Diagnosis not present

## 2017-10-05 DIAGNOSIS — I5032 Chronic diastolic (congestive) heart failure: Secondary | ICD-10-CM | POA: Insufficient documentation

## 2017-10-05 DIAGNOSIS — L97529 Non-pressure chronic ulcer of other part of left foot with unspecified severity: Secondary | ICD-10-CM | POA: Diagnosis not present

## 2017-10-05 DIAGNOSIS — E785 Hyperlipidemia, unspecified: Secondary | ICD-10-CM | POA: Diagnosis not present

## 2017-10-05 DIAGNOSIS — Z7902 Long term (current) use of antithrombotics/antiplatelets: Secondary | ICD-10-CM | POA: Diagnosis not present

## 2017-10-05 DIAGNOSIS — I2510 Atherosclerotic heart disease of native coronary artery without angina pectoris: Secondary | ICD-10-CM | POA: Diagnosis not present

## 2017-10-05 DIAGNOSIS — I252 Old myocardial infarction: Secondary | ICD-10-CM | POA: Insufficient documentation

## 2017-10-05 DIAGNOSIS — I6523 Occlusion and stenosis of bilateral carotid arteries: Secondary | ICD-10-CM | POA: Diagnosis not present

## 2017-10-05 DIAGNOSIS — Z992 Dependence on renal dialysis: Secondary | ICD-10-CM | POA: Diagnosis not present

## 2017-10-05 DIAGNOSIS — I739 Peripheral vascular disease, unspecified: Secondary | ICD-10-CM | POA: Diagnosis not present

## 2017-10-05 DIAGNOSIS — I480 Paroxysmal atrial fibrillation: Secondary | ICD-10-CM | POA: Diagnosis not present

## 2017-10-05 DIAGNOSIS — E11621 Type 2 diabetes mellitus with foot ulcer: Secondary | ICD-10-CM | POA: Insufficient documentation

## 2017-10-05 DIAGNOSIS — Z79899 Other long term (current) drug therapy: Secondary | ICD-10-CM | POA: Insufficient documentation

## 2017-10-05 DIAGNOSIS — I25810 Atherosclerosis of coronary artery bypass graft(s) without angina pectoris: Secondary | ICD-10-CM | POA: Diagnosis not present

## 2017-10-05 DIAGNOSIS — Z9889 Other specified postprocedural states: Secondary | ICD-10-CM | POA: Diagnosis not present

## 2017-10-05 DIAGNOSIS — Z7982 Long term (current) use of aspirin: Secondary | ICD-10-CM | POA: Insufficient documentation

## 2017-10-05 DIAGNOSIS — I132 Hypertensive heart and chronic kidney disease with heart failure and with stage 5 chronic kidney disease, or end stage renal disease: Secondary | ICD-10-CM | POA: Insufficient documentation

## 2017-10-05 MED ORDER — ISOSORBIDE MONONITRATE ER 60 MG PO TB24: 60.0000 mg | ORAL_TABLET | Freq: Every day | ORAL | 3 refills | Status: DC

## 2017-10-05 NOTE — Progress Notes (Signed)
PCP: Dr. Tasia Catchings [facility]) Cardiology: Dr. Aundra Dubin  62 y.o. 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  He was admitted in 9/17 with NSTEMI.  LHC showed 3VD and he had CABG x 3.  He had CKD stage IV when he was admitted and developed ESRD requiring HD while in the hospital.  He had transient atrial fibrillation post-CABG.  He developed cardiogenic shock requiring milrinone but was able to taper off.  Echo in 9/17 showed EF 35-40%.    Admitted 8/1 through 04/16/2017 with malignant hypertension due to medication noncompliance. Continues on HD.  HF M-W-F. Discharge weight 140 pounds.   Admitted 10/18 with chest pain and pulmonary edema. NSTEMI noted on admit. Echo showed preserved EF but + WMAs. Cath with 95% ostial SVG-OM1 stenosis with TIMI 2 flow in graft.  Patient had DES to SVG-OM1.   Admitted in 12/18 with atypical chest pain, suspect not cardiac.   He presents today for followup of CHF and CAD.  He has occasional chest discomfort.  It is central, lasts < 5 minutes, and is not related to exertion or meals.  Sometimes this occurs daily.  It does not feel like pain he has had in the past with ACS.  He has an ulceration on his left foot that is healing. He denies claudication. He continues HD without problems.  No significant exertional dypnea.  BP is elevated today.   Labs (12/18): LDL 64, HDL 31  PMH: 1. Type II diabetes 2. HTN 3. ESRD 4. CAD: NSTEMI 9/17 with CABG x 3 (LIMA-LAD, SVG-OM, SVG-RCA).   - NSTEMI 10/18 s/p DES to ostial SVG and OM 5. Chronic systolic CHF: Ischemic cardiomyopathy.   - Echo (9/17) with EF 35-40%, grade II diastolic dysfunction.  - Echo (1/18) with EF 60-65%, grade II diastolic dysfunction. - Echo (10/18) with EF 55%, Akinesis of the basal-midinferolateral, inferior, and inferoseptal myocardiam. Akinesis of the basal anteroseptal myocardium.  6. Atrial  fibrillation: Paroxysmal.  Only noted post-op CABG.  7. ABIs normal in 12/17. 5/18 ABIs normal.  8. Carotid stenosis: Carotid dopplers (12/18) with < 50% right common carotid stenosis, 8-12% LICA stenosis.   SH: Married, lives in [place], 1 son, nonsmoker, no ETOH, speaks only [place].   FH: No premature CAD.  Review of systems complete and found to be negative unless listed in HPI.    Current Outpatient Medications  Medication Sig Dispense Refill  . acetaminophen (TYLENOL) 500 MG tablet Take 500 mg by mouth every 6 (six) hours as needed for mild pain.    Marland Kitchen aspirin 81 MG chewable tablet Chew 1 tablet (81 mg total) by mouth daily. 30 tablet 1  . atorvastatin (LIPITOR) 80 MG tablet TAKE 1 TABLET BY MOUTH ONCE DAILY 30 tablet 11  . calcium acetate (PHOSLO) 667 MG capsule Take 667 mg by mouth 3 (three) times daily with meals.     . citalopram (CELEXA) 20 MG tablet Take 20 mg by mouth daily.  1  . clopidogrel (PLAVIX) 75 MG tablet Take 1 tablet (75 mg total) by mouth daily. 90 tablet 2  . ezetimibe (ZETIA) 10 MG tablet Take 1 tablet (10 mg total) by mouth daily. 30 tablet 3  . gabapentin (NEURONTIN) 100 MG capsule Take 100 mg by mouth at bedtime.   0  . hydrALAZINE (APRESOLINE) 25 MG tablet Take 1 tablet (25 mg total) by mouth 2 (two) times daily.  60 tablet 3  . Insulin Human (INSULIN PUMP) SOLN Inject into the skin continuous. NOVOLOG    . isosorbide mononitrate (IMDUR) 60 MG 24 hr tablet Take 1 tablet (60 mg total) by mouth daily. 30 tablet 3  . losartan (COZAAR) 50 MG tablet Take 1 tablet (50 mg total) by mouth daily. 30 tablet 0  . metoprolol succinate (TOPROL-XL) 25 MG 24 hr tablet Take 1 tablet (25 mg total) by mouth at bedtime. 30 tablet 0  . naproxen (NAPROSYN) 500 MG tablet Take 1 tablet (500 mg total) by mouth 2 (two) times daily as needed for mild pain or moderate pain. Please take this medication with meals, as it can upset your stomach. 30 tablet 0  . pantoprazole (PROTONIX) 40 MG tablet  Take 40 mg by mouth daily.   0  . Propylene Glycol (SYSTANE BALANCE) 0.6 % SOLN Place 1 drop into both eyes as needed (dry eyes).    . silver sulfADIAZINE (SILVADENE) 1 % cream Apply 1 application topically daily. 50 g 0   No current facility-administered medications for this encounter.    Vitals:   10/05/17 1021  BP: (!) 159/64  Pulse: 77  SpO2: 100%  Weight: 152 lb 4 oz (69.1 kg)   Wt Readings from Last 3 Encounters:  10/05/17 152 lb 4 oz (69.1 kg)  09/13/17 145 lb 4.5 oz (65.9 kg)  09/11/17 170 lb (77.1 kg)   General: NAD Neck: No JVD, no thyromegaly or thyroid nodule.  Lungs: Clear to auscultation bilaterally with normal respiratory effort. CV: Nondisplaced PMI.  Heart regular S1/S2, no S3/S4, 1/6 SEM RUSB.  No peripheral edema.  Left carotid bruit.  2+ PT pulse on the left, trace PT pulse on right. Abdomen: Soft, nontender, no hepatosplenomegaly, no distention.  Skin: Intact without lesions or rashes.  Neurologic: Alert and oriented x 3.  Psych: Normal affect. Extremities: No clubbing or cyanosis.  HEENT: Normal.   Assessment/Plan: 1. CAD: s/p CABG.  NSTEMI 10/18, cath with 95% ostial SVG-OM1 stenosis with TIMI 2 flow in graft, s/p DES to SVG-OM1 07/07/17. He has atypical chest pain.  - On ASA and atorvastatin.    - Would continue Plavix 75 mg daily long-term.    - Increase Imdur to 60 mg daily.  2. Hyperlipidemia: Continue Zetia + atorvastatin. Good lipids 12/18.   3. Chronic diastolic CHF:  Ischemic cardiomyopathy.  EF low in past but improved after CABG. Most recent echo 07/06/17 LVEF 55% with wall motion abnormalities.  - On dialysis for volume control.  - Continue Toprol XL 25 mg at bed time.  - Continue losaran  50 mg daily.   4. PAD: Healing ulcer on left foot, hard to feel pulses in right foot. I will arrange for repeat peripheral arterial dopplers.  5. HTN: BP mildly elevated but given HD, will hold off on increasing meds today. 6. CKD: ESRD.  7. L Carotid  bruit: Mild carotid disease, repeat carotid dopplers in 12/19.   Loralie Champagne 10/05/2017

## 2017-10-05 NOTE — Patient Instructions (Signed)
Increase isorbide mononitratrte IMDUR 60 mg (1 tab) daily  Your physician has requested that you have a lower or upper extremity venous duplex. This test is an ultrasound of the veins in the legs or arms. It looks at venous blood flow that carries blood from the heart to the legs or arms. Allow one hour for a Lower Venous exam. Allow thirty minutes for an Upper Venous exam. There are no restrictions or special instructions.   Your physician recommends that you schedule a follow-up appointment in: 6 months with Dr. Aundra Dubin  (we will call you)

## 2017-10-06 ENCOUNTER — Encounter (HOSPITAL_COMMUNITY): Payer: Medicare Other

## 2017-10-06 ENCOUNTER — Encounter: Payer: Medicare Other | Admitting: Internal Medicine

## 2017-10-06 DIAGNOSIS — E11621 Type 2 diabetes mellitus with foot ulcer: Secondary | ICD-10-CM | POA: Diagnosis not present

## 2017-10-07 ENCOUNTER — Other Ambulatory Visit: Payer: Self-pay | Admitting: Cardiology

## 2017-10-07 DIAGNOSIS — I739 Peripheral vascular disease, unspecified: Secondary | ICD-10-CM

## 2017-10-07 NOTE — Progress Notes (Signed)
Kenneth [name], Kenneth [name] ([id number]) Visit Report for 10/06/2017 Debridement Details Patient Name: Kenneth [name], Kenneth [name] Date of Service: 10/06/2017 3:30 PM Medical Record Number: [id number] Patient Account Number: [id number] Date of Birth/Sex: [birth year] (61 y.o. Male) Treating RN: Roger Shelter Primary Care Provider: Di Kindle Other Clinician: Referring Provider: Di Kindle Treating Provider/Extender: Ricard Dillon Weeks in Treatment: 1 Debridement Performed for Wound #1 Left Toe Great Assessment: Performed By: Physician Ricard Dillon, MD Debridement: Debridement Pre-procedure Verification/Time Yes - 03:57 Out Taken: Start Time: 03:57 Pain Control: Other : lidocaine 4% Level: Skin/Subcutaneous Tissue Total Area Debrided (L x W): 1.8 (cm) x 1.8 (cm) = 3.24 (cm) Tissue and other material Viable, Non-Viable, Eschar, Fibrin/Slough, Skin, Subcutaneous debrided: Instrument: Curette Bleeding: Moderate Hemostasis Achieved: Pressure End Time: 03:58 Procedural Pain: 0 Post Procedural Pain: 0 Response to Treatment: Procedure was tolerated well Post Debridement Measurements of Total Wound Length: (cm) 1.8 Stage: Category/Stage II Width: (cm) 1.8 Depth: (cm) 0.2 Volume: (cm) 0.509 Character of Wound/Ulcer Post Stable Debridement: Post Procedure Diagnosis Same as Pre-procedure Electronic Signature(s) Signed: 10/06/2017 4:36:13 PM By: Roger Shelter Signed: 10/06/2017 5:00:32 PM By: Linton Ham MD Entered By: Roger Shelter on 10/06/2017 15:58:45 Friedly, Kenneth [name] ([id number]) -------------------------------------------------------------------------------- Debridement Details Patient Name: Kenneth [name] Date of Service: 10/06/2017 3:30 PM Medical Record Number: [id number] Patient Account Number: [id number] Date of Birth/Sex: [birth year] (61 y.o. Male) Treating RN: Roger Shelter Primary Care Provider: Di Kindle Other Clinician: Referring Provider: Di Kindle Treating  Provider/Extender: Ricard Dillon Weeks in Treatment: 1 Debridement Performed for Wound #4 Left Toe Fourth Assessment: Performed By: Physician Ricard Dillon, MD Debridement: Debridement Pre-procedure Verification/Time Yes - 03:57 Out Taken: Start Time: 03:57 Pain Control: Other : lidocaine 4% Level: Skin/Subcutaneous Tissue Total Area Debrided (L x W): 1.2 (cm) x 1.4 (cm) = 1.68 (cm) Tissue and other material Viable, Non-Viable, Eschar, Fibrin/Slough, Skin, Subcutaneous debrided: Instrument: Curette Bleeding: Moderate Hemostasis Achieved: Pressure End Time: 03:58 Procedural Pain: 0 Post Procedural Pain: 0 Response to Treatment: Procedure was tolerated well Post Debridement Measurements of Total Wound Length: (cm) 1.2 Stage: Category/Stage II Width: (cm) 1.4 Depth: (cm) 0.2 Volume: (cm) 0.264 Character of Wound/Ulcer Post Stable Debridement: Post Procedure Diagnosis Same as Pre-procedure Electronic Signature(s) Signed: 10/06/2017 4:36:13 PM By: Roger Shelter Signed: 10/06/2017 5:00:32 PM By: Linton Ham MD Entered By: Roger Shelter on 10/06/2017 16:01:30 Kenneth [name] ([id number]) -------------------------------------------------------------------------------- Debridement Details Patient Name: Kenneth [name] Date of Service: 10/06/2017 3:30 PM Medical Record Number: [id number] Patient Account Number: [id number] Date of Birth/Sex: 1956/01/24 (61 y.o. Male) Treating RN: Roger Shelter Primary Care Provider: Di Kindle Other Clinician: Referring Provider: Di Kindle Treating Provider/Extender: Ricard Dillon Weeks in Treatment: 1 Debridement Performed for Wound #5 Left Toe Fifth Assessment: Performed By: Physician Ricard Dillon, MD Debridement: Debridement Pre-procedure Verification/Time Yes - 03:57 Out Taken: Start Time: 03:57 Pain Control: Other : lidocaine 4% Level: Skin/Subcutaneous Tissue Total Area Debrided (L x W): 3.1 (cm) x  1.6 (cm) = 4.96 (cm) Tissue and other material Viable, Non-Viable, Eschar, Fibrin/Slough, Skin, Subcutaneous debrided: Instrument: Curette Bleeding: Moderate Hemostasis Achieved: Pressure End Time: 03:58 Procedural Pain: 0 Post Procedural Pain: 0 Response to Treatment: Procedure was tolerated well Post Debridement Measurements of Total Wound Length: (cm) 3.1 Stage: Category/Stage II Width: (cm) 1.6 Depth: (cm) 0.3 Volume: (cm) 1.169 Character of Wound/Ulcer Post Stable Debridement: Post Procedure Diagnosis Same as Pre-procedure Electronic Signature(s) Signed: 10/06/2017 4:36:13 PM By: Roger Shelter Signed: 10/06/2017 5:00:32 PM By: Linton Ham MD Entered By:  Roger Shelter on 10/06/2017 16:02:02 Kenneth [name], Kenneth [name] ([id number]) -------------------------------------------------------------------------------- HPI Details Patient Name: Kenneth [name], Kenneth [name] Date of Service: 10/06/2017 3:30 PM Medical Record Number: [id number] Patient Account Number: [id number] Date of Birth/Sex: 1955/10/29 (61 y.o. Male) Treating RN: Roger Shelter Primary Care Provider: Di Kindle Other Clinician: Referring Provider: Di Kindle Treating Provider/Extender: Ricard Dillon Weeks in Treatment: 1 History of Present Illness HPI Description: [place] 02/25/17; this is a 62 year old man with type 2 diabetes who has an insulin pump. He has had problems for the last 2 months with wounds on the dorsal aspect of his right second and right third toe. This presented as blisters. He was admitted to hospital from 5/7 through 5/10 with cellulitis in the foot. Cultures at that time grew methicillin sensitive staph aureus. He was treated with Vanco and Zosyn and discharged on Augmentin. He was recorded as having a black necrotic eschar. Arterial studies done showed biphasic and triphasic waveforms. On the right posterior tibial ABI of 0.96 on the left at 0.99. Total brachial index on the right was 0.62 and on  the left at 0.76. It was felt there was a mild decrease in right-sided ABI when compared to a prior study. I'm not completely certain what he has been using on these wounds recently although he does have santyl. 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 He has been followed by his podiatrist. He had an MRI of the foot and 01/21/17 which showed subcutaneous edema over the dorsum of the foot compatible with cellulitis. There was no abscess convincing evidence of osteomyelitis and negative for septic arthritis. The patient is here for review of nonhealing wounds as described. He is not working. 03/04/17; patient arrives today with still an adherent necrotic material over the surface of both wounds. He has continued to use Santyl and I think we'll need to continue to use Santyl. He is had a reasonably complete workup including arterial studies and an MRI. This initially presented as an infection in the toes was cellulitis and a necrotic surface. 03/11/17; still requires ongoing debridement. He received Santyl but he just got it yesterday but he's been using some supply that we gave to him. The area on the third toe appears to be making good progress. The second toe perhaps slightly improved. Both areas still have a tightly adherent necrotic surface that does not wash off. Still requires ongoing debridement 03/18/17; both wound surface is look better he is been using Santyl. 03/25/17 right third toe was just about closed perhaps a very tiny open area inferiorly. The second toe is still open however the base of this looks much better. I think we can switch to silver collagen to the right second toe in place of Santyl 04/01/17; the patient arrived today with a scissor induced injury on the lateral aspect of the same right second toe. He was cutting off the dressing in order to take a shower this morning. Arrived in clinic today with a bleeding area. Her was no laceration per se and nothing  that could be sutured a whole surface layer of tissue was taken off the lateral aspect of the right second toe. In the meantime the right third toe is healed. Dorsal right second toe improved but still not closed. We've been using silver collagen 04/08/17; the patient's wound on the dorsal right third toe is healed still some surface eschar that I did not disturb today. His original wound on the dorsal aspect of the right second  toe continues to look a lot better HOWEVER the scissor injury on the lateral aspect of the tip of his right great toe is a lot worse. Necrotic surface. He does not complain of pain. However he did not dress this all week. 04/20/2017 -- because of his poor eyesight the patient is cutting his surrounding toes when he uses a sharp scissors. I have forbidden him from doing that. He has another wound on the right third toe. 05/06/17; the wounds on his dorsal second and third toes appear closed. He has however an open wound on the medial aspect of the third toe at the level of the PIP joint. This apparently started as a blister and no doubt was due to friction 05/20/17; still has a wound on the medial aspect of his third toe just below the PIP. This does not look infected does not probe the bone but is not made much progress since last time. He also arrives today with a swelling on the lateral aspect of the right first toe compatible with a paronychia in this location 06/03/17; area on the 3rd toe is epithelizalized. and healed ADMISSION 09/29/17 this is a now 62 year old man who I cared for last summer in our sister clinic in [place]. At that point the predominant wounds were on the right second and third toes I think he had a scissor injury on the right first toe. He is here Kenneth [name], Kenneth [name]. ([id number]) actually for review of wounds on his left foot. This apparently was noted by the patient while he was in the shower about a month ago over the plantar aspect of all 5 toes. He was  seen by podiatry on 09/16/17 where there was an x-ray of the foot done I think he was given Silvadene cream. I am not sure that they actually followed him up. The patient is not really clear how this happened. He does walk frequently. He denies any excessive heat in the shower that could've been sufficient to cause wounds on the plantar aspect of all his 5 toes. He has known diabetic neuropathy. He does not have any history of diabetic PAD. Arterial studies done in May 2018 showed an ABI on the right of 0.96 on the left of 0.99. TBI in the right at 0.62 and on the left 0.76. As noted I think he is been using Silvadene cream 10/06/17; patient's x-ray of the foot was negative for osteomyelitis. We have been using Santyl to the wounds. The major issues are on the plantar left first, fourth and fifth toes. The second and third toes look as though they're progressing towards closure. He is not on antibiotics nothing really looks infected here. He does not have significant PAD, the exact etiology of this is really unclear presumably excessive pressure/friction in footwear. Electronic Signature(s) Signed: 10/06/2017 5:00:32 PM By: Linton Ham MD Entered By: Linton Ham on 10/06/2017 16:44:47 Kenneth [name], Kenneth [name] ([id number]) -------------------------------------------------------------------------------- Physical Exam Details Patient Name: Kenneth [name] Date of Service: 10/06/2017 3:30 PM Medical Record Number: [id number] Patient Account Number: [id number] Date of Birth/Sex: September 27, 1955 (61 y.o. Male) Treating RN: Roger Shelter Primary Care Provider: Di Kindle Other Clinician: Referring Provider: Di Kindle Treating Provider/Extender: Ricard Dillon Weeks in Treatment: 1 Constitutional Sitting or standing Blood Pressure is within target range for patient.. Pulse regular and within target range for patient.Marland Kitchen Respirations regular, non-labored and within target range.. Temperature is normal and  within the target range for the patient.Marland Kitchen appears in no distress. Notes wound exam; the  areas in question are on the plantar aspect of all 5 toes on the left foot.he still has necrotic surfaces on the plantar left first, fourth and fifth toes. Using a #5 curet aggressive debridement of all of these areas removing necrotic adherent material. Hemostasis with silver nitrate and direct pressure. The left second and third toes look as though they are progressing towards healing. I see no evidence of infection. His pedal pulses popliteal pulses are normal. He has no palpable adenopathy in the left popliteal or inguinal area Electronic Signature(s) Signed: 10/06/2017 5:00:32 PM By: Linton Ham MD Entered By: Linton Ham on 10/06/2017 16:46:20 Kenneth [name], Kenneth [name] ([id number]) -------------------------------------------------------------------------------- Physician Orders Details Patient Name: Kenneth [name] Date of Service: 10/06/2017 3:30 PM Medical Record Number: [id number] Patient Account Number: [id number] Date of Birth/Sex: 15-Nov-1955 (61 y.o. Male) Treating RN: Roger Shelter Primary Care Provider: Di Kindle Other Clinician: Referring Provider: Di Kindle Treating Provider/Extender: Tito Dine in Treatment: 1 Verbal / Phone Orders: No Diagnosis Coding Wound Cleansing Wound #1 Left Toe Great o Clean wound with Normal Saline. Wound #2 Left Toe Second o Clean wound with Normal Saline. Wound #3 Left Toe Third o Clean wound with Normal Saline. Wound #4 Left Toe Fourth o Clean wound with Normal Saline. Wound #5 Left Toe Fifth o Clean wound with Normal Saline. Anesthetic (add to Medication List) Wound #1 Left Toe Great o Topical Lidocaine 4% cream applied to wound bed prior to debridement (In Clinic Only). Wound #2 Left Toe Second o Topical Lidocaine 4% cream applied to wound bed prior to debridement (In Clinic Only). Wound #3 Left Toe Third o Topical  Lidocaine 4% cream applied to wound bed prior to debridement (In Clinic Only). Wound #4 Left Toe Fourth o Topical Lidocaine 4% cream applied to wound bed prior to debridement (In Clinic Only). Wound #5 Left Toe Fifth o Topical Lidocaine 4% cream applied to wound bed prior to debridement (In Clinic Only). Skin Barriers/Peri-Wound Care o Other: - silver nitrate to great toe, fourth and fifth toe to stop bleeding Primary Wound Dressing Wound #1 Left Toe Great o Santyl Ointment Wound #2 Left Toe Second o Santyl Ointment Wound #3 Left Toe Third o Santyl Ointment Kenneth [name], HIRES. ([id number]) Wound #4 Left Toe Fourth o Santyl Ointment Wound #5 Left Toe Fifth o Santyl Ointment Secondary Dressing Wound #1 Left Toe Great o ABD pad o Gauze and Kerlix/Conform Wound #2 Left Toe Second o ABD pad o Gauze and Kerlix/Conform Wound #3 Left Toe Third o ABD pad o Gauze and Kerlix/Conform Wound #4 Left Toe Fourth o ABD pad o Gauze and Kerlix/Conform Wound #5 Left Toe Fifth o ABD pad o Gauze and Kerlix/Conform Dressing Change Frequency Wound #1 Left Toe Great o Change dressing every day. Wound #2 Left Toe Second o Change dressing every day. Wound #3 Left Toe Third o Change dressing every day. Wound #4 Left Toe Fourth o Change dressing every day. Wound #5 Left Toe Fifth o Change dressing every day. Follow-up Appointments Wound #1 Left Toe Great o Return Appointment in 1 week. Wound #2 Left Toe Second o Return Appointment in 1 week. Wound #3 Left Toe Third o Return Appointment in 1 week. Wound #4 Left Toe Fourth o Return Appointment in 1 week. Kenneth [name], Kenneth [name]. ([id number]) Wound #5 Left Toe Fifth o Return Appointment in 1 week. Off-Loading Wound #1 Left Toe Great o Open toe surgical shoe to: - darko off loading shoe for frontal offloading Wound #2 Left Toe  Second o Open toe surgical shoe to: - darko off loading shoe for  frontal offloading Wound #3 Left Toe Third o Open toe surgical shoe to: - darko off loading shoe for frontal offloading Wound #4 Left Toe Fourth o Open toe surgical shoe to: - darko off loading shoe for frontal offloading Wound #5 Left Toe Fifth o Open toe surgical shoe to: - darko off loading shoe for frontal offloading Electronic Signature(s) Signed: 10/06/2017 4:36:13 PM By: Roger Shelter Signed: 10/06/2017 5:00:32 PM By: Linton Ham MD Entered By: Roger Shelter on 10/06/2017 16:03:47 BRUIN, BOLGER ([id number]) -------------------------------------------------------------------------------- Problem List Details Patient Name: Kenneth [name] Date of Service: 10/06/2017 3:30 PM Medical Record Number: [id number] Patient Account Number: [id number] Date of Birth/Sex: 1955-10-05 (61 y.o. Male) Treating RN: Roger Shelter Primary Care Provider: Di Kindle Other Clinician: Referring Provider: Di Kindle Treating Provider/Extender: Ricard Dillon Weeks in Treatment: 1 Active Problems ICD-10 Encounter Code Description Active Date Diagnosis E11.621 Type 2 diabetes mellitus with foot ulcer 09/29/2017 Yes L97.528 Non-pressure chronic ulcer of other part of left foot with other 09/29/2017 Yes specified severity E11.40 Type 2 diabetes mellitus with diabetic neuropathy, unspecified 09/29/2017 Yes Inactive Problems Resolved Problems Electronic Signature(s) Signed: 10/06/2017 5:00:32 PM By: Linton Ham MD Entered By: Linton Ham on 10/06/2017 16:43:09 Holtmeyer, Kenneth [name] ([id number]) -------------------------------------------------------------------------------- Progress Note Details Patient Name: Kenneth [name] Date of Service: 10/06/2017 3:30 PM Medical Record Number: [id number] Patient Account Number: [id number] Date of Birth/Sex: [birth year] (61 y.o. Male) Treating RN: Roger Shelter Primary Care Provider: Di Kindle Other Clinician: Referring Provider: Di Kindle Treating  Provider/Extender: Ricard Dillon Weeks in Treatment: 1 Subjective History of Present Illness (HPI) [place] 02/25/17; this is a 62 year old man with type 2 diabetes who has an insulin pump. He has had problems for the last 2 months with wounds on the dorsal aspect of his right second and right third toe. This presented as blisters. He was admitted to hospital from 5/7 through 5/10 with cellulitis in the foot. Cultures at that time grew methicillin sensitive staph aureus. He was treated with Vanco and Zosyn and discharged on Augmentin. He was recorded as having a black necrotic eschar. Arterial studies done showed biphasic and triphasic waveforms. On the right posterior tibial ABI of 0.96 on the left at 0.99. Total brachial index on the right was 0.62 and on the left at 0.76. It was felt there was a mild decrease in right-sided ABI when compared to a prior study. I'm not completely certain what he has been using on these wounds recently although he does have santyl. 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 He has been followed by his podiatrist. He had an MRI of the foot and 01/21/17 which showed subcutaneous edema over the dorsum of the foot compatible with cellulitis. There was no abscess convincing evidence of osteomyelitis and negative for septic arthritis. The patient is here for review of nonhealing wounds as described. He is not working. 03/04/17; patient arrives today with still an adherent necrotic material over the surface of both wounds. He has continued to use Santyl and I think we'll need to continue to use Santyl. He is had a reasonably complete workup including arterial studies and an MRI. This initially presented as an infection in the toes was cellulitis and a necrotic surface. 03/11/17; still requires ongoing debridement. He received Santyl but he just got it yesterday but he's been using some supply that we gave to him. The area on the  third toe  appears to be making good progress. The second toe perhaps slightly improved. Both areas still have a tightly adherent necrotic surface that does not wash off. Still requires ongoing debridement 03/18/17; both wound surface is look better he is been using Santyl. 03/25/17 right third toe was just about closed perhaps a very tiny open area inferiorly. The second toe is still open however the base of this looks much better. I think we can switch to silver collagen to the right second toe in place of Santyl 04/01/17; the patient arrived today with a scissor induced injury on the lateral aspect of the same right second toe. He was cutting off the dressing in order to take a shower this morning. Arrived in clinic today with a bleeding area. Her was no laceration per se and nothing that could be sutured a whole surface layer of tissue was taken off the lateral aspect of the right second toe. In the meantime the right third toe is healed. Dorsal right second toe improved but still not closed. We've been using silver collagen 04/08/17; the patient's wound on the dorsal right third toe is healed still some surface eschar that I did not disturb today. His original wound on the dorsal aspect of the right second toe continues to look a lot better HOWEVER the scissor injury on the lateral aspect of the tip of his right great toe is a lot worse. Necrotic surface. He does not complain of pain. However he did not dress this all week. 04/20/2017 -- because of his poor eyesight the patient is cutting his surrounding toes when he uses a sharp scissors. I have forbidden him from doing that. He has another wound on the right third toe. 05/06/17; the wounds on his dorsal second and third toes appear closed. He has however an open wound on the medial aspect of the third toe at the level of the PIP joint. This apparently started as a blister and no doubt was due to friction 05/20/17; still has a wound on the medial aspect of his  third toe just below the PIP. This does not look infected does not probe the bone but is not made much progress since last time. He also arrives today with a swelling on the lateral aspect of the right first toe compatible with a paronychia in this location 06/03/17; area on the 3rd toe is epithelizalized. and healed ADMISSION 09/29/17 this is a now 62 year old man who I cared for last summer in our sister clinic in [place]. At that point the [place]. ([id number]) predominant wounds were on the right second and third toes I think he had a scissor injury on the right first toe. He is here actually for review of wounds on his left foot. This apparently was noted by the patient while he was in the shower about a month ago over the plantar aspect of all 5 toes. He was seen by podiatry on 09/16/17 where there was an x-ray of the foot done I think he was given Silvadene cream. I am not sure that they actually followed him up. The patient is not really clear how this happened. He does walk frequently. He denies any excessive heat in the shower that could've been sufficient to cause wounds on the plantar aspect of all his 5 toes. He has known diabetic neuropathy. He does not have any history of diabetic PAD. Arterial studies done in May 2018 showed an ABI on the right of 0.96 on the  left of 0.99. TBI in the right at 0.62 and on the left 0.76. As noted I think he is been using Silvadene cream 10/06/17; patient's x-ray of the foot was negative for osteomyelitis. We have been using Santyl to the wounds. The major issues are on the plantar left first, fourth and fifth toes. The second and third toes look as though they're progressing towards closure. He is not on antibiotics nothing really looks infected here. He does not have significant PAD, the exact etiology of this is really unclear presumably excessive pressure/friction in footwear. Objective Constitutional Sitting or standing Blood Pressure is  within target range for patient.. Pulse regular and within target range for patient.Marland Kitchen Respirations regular, non-labored and within target range.. Temperature is normal and within the target range for the patient.Marland Kitchen appears in no distress. Vitals Time Taken: 3:40 AM, Height: 67 in, Weight: 150 lbs, BMI: 23.5, Temperature: 98.5 F, Pulse: 75 bpm, Respiratory Rate: 18 breaths/min, Blood Pressure: 147/57 mmHg. General Notes: wound exam; the areas in question are on the plantar aspect of all 5 toes on the left foot.he still has necrotic surfaces on the plantar left first, fourth and fifth toes. Using a #5 curet aggressive debridement of all of these areas removing necrotic adherent material. Hemostasis with silver nitrate and direct pressure. The left second and third toes look as though they are progressing towards healing. I see no evidence of infection. His pedal pulses popliteal pulses are normal. He has no palpable adenopathy in the left popliteal or inguinal area Integumentary (Hair, Skin) Wound #1 status is Open. Original cause of wound was Gradually Appeared. The wound is located on the Left Toe Great. The wound measures 1.8cm length x 1.8cm width x 0.1cm depth; 2.545cm^2 area and 0.254cm^3 volume. There is Fat Layer (Subcutaneous Tissue) Exposed exposed. There is no tunneling or undermining noted. There is a medium amount of serosanguineous drainage noted. The wound margin is flat and intact. There is small (1-33%) pink granulation within the wound bed. There is a large (67-100%) amount of necrotic tissue within the wound bed including Adherent Slough. The periwound skin appearance exhibited: Callus, Scarring. The periwound skin appearance did not exhibit: Crepitus, Excoriation, Induration, Rash, Dry/Scaly, Maceration, Atrophie Blanche, Cyanosis, Ecchymosis, Hemosiderin Staining, Mottled, Pallor, Rubor, Erythema. Wound #2 status is Open. Original cause of wound was Gradually Appeared. The  wound is located on the Left Toe Second. The wound measures 0.5cm length x 0.5cm width x 0.1cm depth; 0.196cm^2 area and 0.02cm^3 volume. There is no tunneling or undermining noted. There is a none present amount of drainage noted. The wound margin is flat and intact. There is large (67-100%) granulation within the wound bed. There is a small (1-33%) amount of necrotic tissue within the wound bed including Eschar. The periwound skin appearance exhibited: Callus. The periwound skin appearance did not exhibit: Crepitus, Excoriation, Induration, Rash, Scarring, Dry/Scaly, Maceration, Atrophie Blanche, Cyanosis, Ecchymosis, Hemosiderin Staining, Mottled, Pallor, Rubor, Erythema. Periwound temperature was noted as No Abnormality. The Kenneth [name], Kenneth [name]. ([id number]) periwound has tenderness on palpation. Wound #3 status is Open. Original cause of wound was Gradually Appeared. The wound is located on the Left Toe Third. The wound measures 0.5cm length x 0.5cm width x 0.1cm depth; 0.196cm^2 area and 0.02cm^3 volume. There is no tunneling or undermining noted. There is a none present amount of drainage noted. The wound margin is flat and intact. There is large (67-100%) granulation within the wound bed. There is a small (1-33%) amount of necrotic tissue within  the wound bed including Eschar. The periwound skin appearance exhibited: Callus, Scarring. The periwound skin appearance did not exhibit: Crepitus, Excoriation, Induration, Rash, Dry/Scaly, Maceration, Atrophie Blanche, Cyanosis, Ecchymosis, Hemosiderin Staining, Mottled, Pallor, Rubor, Erythema. Periwound temperature was noted as No Abnormality. The periwound has tenderness on palpation. Wound #4 status is Open. Original cause of wound was Gradually Appeared. The wound is located on the Left Toe Fourth. The wound measures 1.2cm length x 1.4cm width x 0.1cm depth; 1.319cm^2 area and 0.132cm^3 volume. There is Fat Layer (Subcutaneous Tissue) Exposed  exposed. There is no tunneling or undermining noted. There is a medium amount of serosanguineous drainage noted. The wound margin is flat and intact. There is medium (34-66%) red granulation within the wound bed. There is a medium (34-66%) amount of necrotic tissue within the wound bed including Eschar and Adherent Slough. The periwound skin appearance exhibited: Callus, Scarring. The periwound skin appearance did not exhibit: Crepitus, Excoriation, Induration, Rash, Dry/Scaly, Maceration, Atrophie Blanche, Cyanosis, Ecchymosis, Hemosiderin Staining, Mottled, Pallor, Rubor, Erythema. Wound #5 status is Open. Original cause of wound was Gradually Appeared. The wound is located on the Left Toe Fifth. The wound measures 3.1cm length x 1.6cm width x 0.2cm depth; 3.896cm^2 area and 0.779cm^3 volume. There is Fat Layer (Subcutaneous Tissue) Exposed exposed. There is no tunneling or undermining noted. There is a medium amount of serosanguineous drainage noted. The wound margin is flat and intact. There is medium (34-66%) red granulation within the wound bed. There is a medium (34-66%) amount of necrotic tissue within the wound bed including Eschar and Adherent Slough. The periwound skin appearance exhibited: Callus, Induration, Scarring. The periwound skin appearance did not exhibit: Crepitus, Excoriation, Rash, Dry/Scaly, Maceration, Atrophie Blanche, Cyanosis, Ecchymosis, Hemosiderin Staining, Mottled, Pallor, Rubor, Erythema. Periwound temperature was noted as No Abnormality. 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 There was a Skin/Subcutaneous Tissue Debridement ([id number])  debridement with total area of 3.24 sq cm performed by Ricard Dillon, MD. with the following instrument(s): Curette to remove Viable and Non-Viable tissue/material including Fibrin/Slough, Eschar, Skin, and Subcutaneous after achieving pain control using Other (lidocaine 4%). A time out was conducted at 03:57, prior to the start of the procedure. A Moderate amount of bleeding was controlled with Pressure. The procedure was tolerated well with a pain level of 0 throughout and a pain level of 0 following the procedure. Post Debridement Measurements: 1.8cm length x 1.8cm width x 0.2cm depth; 0.509cm^3 volume. Post debridement Stage noted as Category/Stage II. Character of Wound/Ulcer Post Debridement is stable. Post procedure Diagnosis Wound #1: Same as Pre-Procedure Kenneth [name], Kenneth [name]. ([id number]) Wound #4 Pre-procedure diagnosis of Wound #4 is a Pressure Ulcer located on the Left Toe Fourth . There was a Skin/Subcutaneous Tissue Debridement ([id number]) debridement with total area of 1.68 sq cm performed by Ricard Dillon, MD. with the following instrument(s): Curette to remove Viable and Non-Viable tissue/material including Fibrin/Slough, Eschar, Skin, and Subcutaneous after achieving pain control using Other (lidocaine 4%). A time out was conducted at 03:57, prior to the start of the procedure. A Moderate amount of bleeding was controlled with Pressure. The procedure was tolerated well with a pain level of 0 throughout and a pain level of 0  following the procedure. Post Debridement Measurements: 1.2cm length x 1.4cm width x 0.2cm depth; 0.264cm^3 volume. Post debridement Stage noted as Category/Stage II. Character of Wound/Ulcer Post Debridement is stable. Post procedure Diagnosis Wound #4: Same as Pre-Procedure Wound #5 Pre-procedure diagnosis of Wound #5 is a Pressure Ulcer located on the Left Toe Fifth . There was a Skin/Subcutaneous Tissue Debridement ([id number]) debridement with total  area of 4.96 sq cm performed by Ricard Dillon, MD. with the following instrument(s): Curette to remove Viable and Non-Viable tissue/material including Fibrin/Slough, Eschar, Skin, and Subcutaneous after achieving pain control using Other (lidocaine 4%). A time out was conducted at 03:57, prior to the start of the procedure. A Moderate amount of bleeding was controlled with Pressure. The procedure was tolerated well with a pain level of 0 throughout and a pain level of 0 following the procedure. Post Debridement Measurements: 3.1cm length x 1.6cm width x 0.3cm depth; 1.169cm^3 volume. Post debridement Stage noted as Category/Stage II. Character of Wound/Ulcer Post Debridement is stable. Post procedure Diagnosis Wound #5: Same as Pre-Procedure Plan Wound Cleansing: Wound #1 Left Toe Great: Clean wound with Normal Saline. Wound #2 Left Toe Second: Clean wound with Normal Saline. Wound #3 Left Toe Third: Clean wound with Normal Saline. Wound #4 Left Toe Fourth: Clean wound with Normal Saline. Wound #5 Left Toe Fifth: Clean wound with Normal Saline. Anesthetic (add to Medication List): Wound #1 Left Toe Great: Topical Lidocaine 4% cream applied to wound bed prior to debridement (In Clinic Only). Wound #2 Left Toe Second: Topical Lidocaine 4% cream applied to wound bed prior to debridement (In Clinic Only). Wound #3 Left Toe Third: Topical Lidocaine 4% cream applied to wound bed prior to debridement (In Clinic Only). Wound #4 Left Toe Fourth: Topical Lidocaine 4% cream applied to wound bed prior to debridement (In Clinic Only). Wound #5 Left Toe Fifth: Topical Lidocaine 4% cream applied to wound bed prior to debridement (In Clinic Only). Skin Barriers/Peri-Wound Care: Other: - silver nitrate to great toe, fourth and fifth toe to stop bleeding Primary Wound Dressing: Wound #1 Left Toe Great: 2 [address] CHALLEN, SPAINHOUR. ([id number]) Wound #2 Left Toe Second: Santyl Ointment Wound  #3 Left Toe Third: Santyl Ointment Wound #4 Left Toe Fourth: Santyl Ointment Wound #5 Left Toe Fifth: Santyl Ointment Secondary Dressing: Wound #1 Left Toe Great: ABD pad Gauze and Kerlix/Conform Wound #2 Left Toe Second: ABD pad Gauze and Kerlix/Conform Wound #3 Left Toe Third: ABD pad Gauze and Kerlix/Conform Wound #4 Left Toe Fourth: ABD pad Gauze and Kerlix/Conform Wound #5 Left Toe Fifth: ABD pad Gauze and Kerlix/Conform Dressing Change Frequency: Wound #1 Left Toe Great: Change dressing every day. Wound #2 Left Toe Second: Change dressing every day. Wound #3 Left Toe Third: Change dressing every day. Wound #4 Left Toe Fourth: Change dressing every day. Wound #5 Left Toe Fifth: Change dressing every day. Follow-up Appointments: Wound #1 Left Toe Great: Return Appointment in 1 week. Wound #2 Left Toe Second: Return Appointment in 1 week. Wound #3 Left Toe Third: Return Appointment in 1 week. Wound #4 Left Toe Fourth: Return Appointment in 1 week. Wound #5 Left Toe Fifth: Return Appointment in 1 week. Off-Loading: Wound #1 Left Toe Great: Open toe surgical shoe to: - darko off loading shoe for frontal offloading Wound #2 Left Toe Second: Open toe surgical shoe to: - darko off loading shoe for frontal offloading Wound #3 Left Toe Third: Open toe surgical shoe to: - darko off loading shoe for  frontal offloading Wound #4 Left Toe Fourth: Open toe surgical shoe to: - darko off loading shoe for frontal offloading Wound #5 Left Toe Fifth: Open toe surgical shoe to: - darko off loading shoe for frontal offloading Hawe, Christen W. ([id number]) #1continue with Santyl #2 continue with Kerlix and net to offload the toes #3 the patient arrived with a facemask on states that he is getting a cold. He looks listless but he just had dialysis today and his wife states this often drains him Electronic Signature(s) Signed: 10/06/2017 5:00:32 PM By: Linton Ham MD Entered  By: Linton Ham on 10/06/2017 16:47:36 Hymes, Kenneth [name] ([id number]) -------------------------------------------------------------------------------- SuperBill Details Patient Name: Kenneth [name] Date of Service: 10/06/2017 Medical Record Number: [id number] Patient Account Number: [id number] Date of Birth/Sex: [birth year] (61 y.o. Male) Treating RN: Roger Shelter Primary Care Provider: Di Kindle Other Clinician: Referring Provider: Di Kindle Treating Provider/Extender: Ricard Dillon Weeks in Treatment: 1 Diagnosis Coding ICD-10 Codes Code Description E11.621 Type 2 diabetes mellitus with foot ulcer L97.528 Non-pressure chronic ulcer of other part of left foot with other specified severity E11.40 Type 2 diabetes mellitus with diabetic neuropathy, unspecified Facility Procedures CPT4 Code Description: [id number] - DEB SUBQ TISSUE 20 SQ CM/< ICD-10 Diagnosis Description E11.621 Type 2 diabetes mellitus with foot ulcer L97.528 Non-pressure chronic ulcer of other part of left foot with other Modifier: specified sever Quantity: 1 ity Physician Procedures CPT4 Code Description: [id number] - WC PHYS SUBQ TISS 20 SQ CM ICD-10 Diagnosis Description E11.621 Type 2 diabetes mellitus with foot ulcer L97.528 Non-pressure chronic ulcer of other part of left foot with other Modifier: specified severi Quantity: 1 ty Electronic Signature(s) Signed: 10/06/2017 5:00:32 PM By: Linton Ham MD Entered By: Linton Ham on 10/06/2017 16:47:54

## 2017-10-07 NOTE — Progress Notes (Signed)
SHADEE, MONTOYA ([id number]) Visit Report for 10/06/2017 Arrival Information Details Patient Name: LOGIN, MUCKLEROY Date of Service: 10/06/2017 3:30 PM Medical Record Number: [id number] Patient Account Number: [id number] Date of Birth/Sex: [birth year] (62 y.o. Male) Treating RN: Roger Shelter Primary Care Danyale Ridinger: Di Kindle Other Clinician: Referring Yanice Maqueda: Di Kindle Treating Rigby Swamy/Extender: Tito Dine in Treatment: 1 Visit Information History Since Last Visit All ordered tests and consults were completed: No Patient Arrived: Wheel Chair Added or deleted any medications: No Arrival Time: 15:37 Any new allergies or adverse reactions: No Accompanied By: wife and interpreter Had a fall or experienced change in No activities of daily living that may affect Transfer Assistance: None risk of falls: Patient Identification Verified: Yes Signs or symptoms of abuse/neglect since last visito No Secondary Verification Process Yes Hospitalized since last visit: No Completed: Pain Present Now: No Patient Has Alerts: Yes Patient Alerts: ABI RIght- 0.96 ABI Left- 0.99 Electronic Signature(s) Signed: 10/06/2017 4:36:13 PM By: Roger Shelter Entered By: Roger Shelter on 10/06/2017 15:38:36 Schellhase, Glee Arvin ([id number]) -------------------------------------------------------------------------------- Encounter Discharge Information Details Patient Name: [name] Date of Service: 10/06/2017 3:30 PM Medical Record Number: [id number] Patient Account Number: [id number] Date of Birth/Sex: 10-29-1955 (62 y.o. Male) Treating RN: Roger Shelter Primary Care Santana Edell: Di Kindle Other Clinician: Referring Mounir Skipper: Di Kindle Treating Kai Calico/Extender: Tito Dine in Treatment: 1 Encounter Discharge Information Items Discharge Pain Level: 0 Discharge Condition: Stable Ambulatory Status: Unstable Discharge Destination: Home Transportation: Private Auto Accompanied  By: wife Schedule Follow-up Appointment: Yes Medication Reconciliation completed and No provided to Patient/Care Jisela Merlino: Provided on Clinical Summary of Care: 10/06/2017 Form Type Recipient Paper Patient JC Electronic Signature(s) Signed: 10/06/2017 4:36:13 PM By: Roger Shelter Entered By: Roger Shelter on 10/06/2017 16:24:31 Resler, Glee Arvin ([id number]) -------------------------------------------------------------------------------- Lower Extremity Assessment Details Patient Name: [name] Date of Service: 10/06/2017 3:30 PM Medical Record Number: [id number] Patient Account Number: [id number] Date of Birth/Sex: [birth year] (62 y.o. Male) Treating RN: Roger Shelter Primary Care Zigmond Trela: Di Kindle Other Clinician: Referring Bartlett Enke: Di Kindle Treating Mackinley Kiehn/Extender: Ricard Dillon Weeks in Treatment: 1 Edema Assessment Assessed: [Left: No] [Right: No] Edema: [Left: N] [Right: o] Vascular Assessment Claudication: Claudication Assessment [Left:None] Pulses: Dorsalis Pedis Palpable: [Left:Yes] Posterior Tibial Extremity colors, hair growth, and conditions: Extremity Color: [Left:Normal] Hair Growth on Extremity: [Left:Yes] Temperature of Extremity: [Left:Warm] Capillary Refill: [Left:< 3 seconds] Toe Nail Assessment Left: Right: Thick: No Discolored: No Deformed: No Improper Length and Hygiene: No Electronic Signature(s) Signed: 10/06/2017 4:36:13 PM By: Roger Shelter Entered By: Roger Shelter on 10/06/2017 15:52:04 Thrall, Glee Arvin ([id number]) -------------------------------------------------------------------------------- Multi Wound Chart Details Patient Name: [name] Date of Service: 10/06/2017 3:30 PM Medical Record Number: [id number] Patient Account Number: [id number] Date of Birth/Sex: [birth year] (62 y.o. Male) Treating RN: Roger Shelter Primary Care Shaneice Barsanti: Di Kindle Other Clinician: Referring Sangita Zani: Di Kindle Treating  Oron Westrup/Extender: Ricard Dillon Weeks in Treatment: 1 Vital Signs Height(in): 52 Pulse(bpm): 75 Weight(lbs): 150 Blood Pressure(mmHg): 147/57 Body Mass Index(BMI): 23 Temperature(F): 98.5 Respiratory Rate 18 (breaths/min): Photos: [1:No Photos] [2:No Photos] [3:No Photos] Wound Location: [1:Left Toe Great] [2:Left Toe Second] [3:Left Toe Third] Wounding Event: [1:Gradually Appeared] [2:Gradually Appeared] [3:Gradually Appeared] Primary Etiology: [1:Pressure Ulcer] [2:Pressure Ulcer] [3:Pressure Ulcer] Comorbid History: [1:Cataracts, Anemia, Hypertension, Myocardial Infarction, Type I Diabetes, Rheumatoid Arthritis] [2:Cataracts, Anemia, Hypertension, Myocardial Infarction, Type I Diabetes, Rheumatoid Arthritis] [3:Cataracts, Anemia, Hypertension,  Myocardial Infarction, Type I Diabetes, Rheumatoid Arthritis] Date Acquired: [1:08/29/2017] [2:08/29/2017] [3:08/30/2017] Weeks of Treatment: [1:1] [2:1] [3:1] Wound  Status: [1:Open] [2:Open] [3:Open] Measurements L x W x D [1:1.8x1.8x0.1] [2:0.5x0.5x0.1] [3:0.5x0.5x0.1] (cm) Area (cm) : [1:2.545] [2:0.196] [3:0.196] Volume (cm) : [1:0.254] [2:0.02] [3:0.02] % Reduction in Area: [1:41.10%] [2:16.90%] [3:81.10%] % Reduction in Volume: [1:41.20%] [2:16.70%] [3:80.80%] Classification: [1:Category/Stage II] [2:Category/Stage II] [3:Category/Stage II] Exudate Amount: [1:Medium] [2:None Present] [3:None Present] Exudate Type: [1:Serosanguineous] [2:N/A] [3:N/A] Exudate Color: [1:red, brown] [2:N/A] [3:N/A] Wound Margin: [1:Flat and Intact] [2:Flat and Intact] [3:Flat and Intact] Granulation Amount: [1:Small (1-33%)] [2:Large (67-100%)] [3:Large (67-100%)] Granulation Quality: [1:Pink] [2:N/A] [3:N/A] Necrotic Amount: [1:Large (67-100%)] [2:Small (1-33%)] [3:Small (1-33%)] Necrotic Tissue: [1:Adherent Slough] [2:Eschar] [3:Eschar] Exposed Structures: [1:Fat Layer (Subcutaneous Tissue) Exposed: Yes Fascia: No Tendon: No Muscle: No  Joint: No Bone: No] [2:Fascia: No Fat Layer (Subcutaneous Tissue) Exposed: No Tendon: No Muscle: No Joint: No Bone: No] [3:Fascia: No Fat Layer (Subcutaneous Tissue)  Exposed: No Tendon: No Muscle: No Joint: No Bone: No] Epithelialization: [1:Medium (34-66%)] [2:Large (67-100%)] [3:Large (67-100%)] Periwound Skin Texture: [1:Callus: Yes Scarring: Yes Excoriation: No] [2:Callus: Yes Excoriation: No Induration: No] [3:Callus: Yes Scarring: Yes Excoriation: No] Induration: No Crepitus: No Induration: No Crepitus: No Rash: No Crepitus: No Rash: No Scarring: No Rash: No Periwound Skin Moisture: Maceration: No Maceration: No Maceration: No Dry/Scaly: No Dry/Scaly: No Dry/Scaly: No Periwound Skin Color: Atrophie Blanche: No Atrophie Blanche: No Atrophie Blanche: No Cyanosis: No Cyanosis: No Cyanosis: No Ecchymosis: No Ecchymosis: No Ecchymosis: No Erythema: No Erythema: No Erythema: No Hemosiderin Staining: No Hemosiderin Staining: No Hemosiderin Staining: No Mottled: No Mottled: No Mottled: No Pallor: No Pallor: No Pallor: No Rubor: No Rubor: No Rubor: No Temperature: N/A No Abnormality No Abnormality Tenderness on Palpation: No Yes Yes Wound Preparation: Ulcer Cleansing: Ulcer Cleansing: Ulcer Cleansing: Rinsed/Irrigated with Saline Rinsed/Irrigated with Saline Rinsed/Irrigated with Saline Topical Anesthetic Applied: Topical Anesthetic Applied: Topical Anesthetic Applied: Other: lidocaine 4% Other: lidocaine 4% Other: lidocaine 4% Wound Number: 4 5 N/A Photos: No Photos No Photos N/A Wound Location: Left Toe Fourth Left Toe Fifth N/A Wounding Event: Gradually Appeared Gradually Appeared N/A Primary Etiology: Pressure Ulcer Pressure Ulcer N/A Comorbid History: Cataracts, Anemia, Cataracts, Anemia, N/A Hypertension, Myocardial Hypertension, Myocardial Infarction, Type I Diabetes, Infarction, Type I Diabetes, Rheumatoid Arthritis Rheumatoid Arthritis Date  Acquired: 08/30/2017 08/30/2017 N/A Weeks of Treatment: 1 1 N/A Wound Status: Open Open N/A Measurements L x W x D 1.2x1.4x0.1 3.1x1.6x0.2 N/A (cm) Area (cm) : 1.319 3.896 N/A Volume (cm) : 0.132 0.779 N/A % Reduction in Area: 12.50% 0.00% N/A % Reduction in Volume: 12.60% -99.70% N/A Classification: Category/Stage II Category/Stage II N/A Exudate Amount: Medium Medium N/A Exudate Type: Serosanguineous Serosanguineous N/A Exudate Color: red, brown red, brown N/A Wound Margin: Flat and Intact Flat and Intact N/A Granulation Amount: Medium (34-66%) Medium (34-66%) N/A Granulation Quality: Red Red N/A Necrotic Amount: Medium (34-66%) Medium (34-66%) N/A Necrotic Tissue: Eschar, Adherent Slough Eschar, Adherent Slough N/A Exposed Structures: Fat Layer (Subcutaneous Fat Layer (Subcutaneous N/A Tissue) Exposed: Yes Tissue) Exposed: Yes Fascia: No Fascia: No Tendon: No Tendon: No Muscle: No Muscle: No Joint: No Joint: No Bone: No Bone: No Epithelialization: Large (67-100%) Large (67-100%) N/A Periwound Skin Texture: N/A Hackel, Rashaud W. ([id number]) Callus: Yes Induration: Yes Scarring: Yes Callus: Yes Excoriation: No Scarring: Yes Induration: No Excoriation: No Crepitus: No Crepitus: No Rash: No Rash: No Periwound Skin Moisture: Maceration: No Maceration: No N/A Dry/Scaly: No Dry/Scaly: No Periwound Skin Color: Atrophie Blanche: No Atrophie Blanche: No N/A Cyanosis: No Cyanosis: No Ecchymosis: No Ecchymosis: No Erythema: No Erythema: No Hemosiderin Staining: No  Hemosiderin Staining: No Mottled: No Mottled: No Pallor: No Pallor: No Rubor: No Rubor: No Temperature: N/A No Abnormality N/A Tenderness on Palpation: No No N/A Wound Preparation: Ulcer Cleansing: Ulcer Cleansing: N/A Rinsed/Irrigated with Saline Rinsed/Irrigated with Saline Topical Anesthetic Applied: Topical Anesthetic Applied: Other: lidocaine 4% Other: lidocaine 4% Treatment  Notes Electronic Signature(s) Signed: 10/06/2017 4:36:13 PM By: Roger Shelter Entered By: Roger Shelter on 10/06/2017 15:56:41 Korinek, Glee Arvin ([id number]) -------------------------------------------------------------------------------- [place] Details Patient Name: [name] Date of Service: 10/06/2017 3:30 PM Medical Record Number: [id number] Patient Account Number: [id number] Date of Birth/Sex: Jun 08, 1956 (61 y.o. Male) Treating RN: Roger Shelter Primary Care Azaiah Licciardi: Di Kindle Other Clinician: Referring Khian Remo: Di Kindle Treating Salvadore Valvano/Extender: Tito Dine in Treatment: 1 Active Inactive ` Orientation to the Wound Care Program Nursing Diagnoses: Knowledge deficit related to the wound healing center program Goals: Patient/caregiver will verbalize understanding of the [place] Program Date Initiated: 09/29/2017 Target Resolution Date: 10/19/2017 Goal Status: Active Interventions: Provide education on orientation to the wound center Notes: ` Wound/Skin Impairment Nursing Diagnoses: Impaired tissue integrity Goals: Patient/caregiver will verbalize understanding of skin care regimen Date Initiated: 09/29/2017 Target Resolution Date: 10/19/2017 Goal Status: Active Ulcer/skin breakdown will have a volume reduction of 30% by week 4 Date Initiated: 09/29/2017 Target Resolution Date: 10/19/2017 Goal Status: Active Interventions: Assess ulceration(s) every visit Treatment Activities: Skin care regimen initiated : 09/29/2017 Notes: Electronic Signature(s) Signed: 10/06/2017 4:36:13 PM By: Roger Shelter Entered By: Roger Shelter on 10/06/2017 15:56:30 Speigner, Glee Arvin ([id number]) -------------------------------------------------------------------------------- Pain Assessment Details Patient Name: [name] Date of Service: 10/06/2017 3:30 PM Medical Record Number: [id number] Patient Account Number: [id number] Date of  Birth/Sex: [birth year] (61 y.o. Male) Treating RN: Roger Shelter Primary Care Kinzleigh Kandler: Di Kindle Other Clinician: Referring Valyncia Wiens: Di Kindle Treating Koven Belinsky/Extender: Ricard Dillon Weeks in Treatment: 1 Active Problems Location of Pain Severity and Description of Pain Patient Has Paino No Site Locations Pain Management and Medication Current Pain Management: Electronic Signature(s) Signed: 10/06/2017 4:36:13 PM By: Roger Shelter Entered By: Roger Shelter on 10/06/2017 15:39:04 Nason, Glee Arvin ([id number]) -------------------------------------------------------------------------------- Patient/Caregiver Education Details Patient Name: [name] Date of Service: 10/06/2017 3:30 PM Medical Record Number: [id number] Patient Account Number: [id number] Date of Birth/Gender: 1956/07/26 (61 y.o. Male) Treating RN: Roger Shelter Primary Care Physician: Di Kindle Other Clinician: Referring Physician: Di Kindle Treating Physician/Extender: Tito Dine in Treatment: 1 Education Assessment Education Provided To: Patient and Caregiver Education Topics Provided Wound Debridement: Handouts: Wound Debridement Methods: Explain/Verbal Responses: State content correctly Wound/Skin Impairment: Handouts: Caring for Your Ulcer Methods: Explain/Verbal Responses: State content correctly Electronic Signature(s) Signed: 10/06/2017 4:36:13 PM By: Roger Shelter Entered By: Roger Shelter on 10/06/2017 16:24:48 Brodersen, Glee Arvin ([id number]) -------------------------------------------------------------------------------- Wound Assessment Details Patient Name: [name] Date of Service: 10/06/2017 3:30 PM Medical Record Number: [id number] Patient Account Number: [id number] Date of Birth/Sex: August 04, 1956 (61 y.o. Male) Treating RN: Roger Shelter Primary Care Aleighna Wojtas: Di Kindle Other Clinician: Referring Charletha Dalpe: Di Kindle Treating Dane Bloch/Extender: Ricard Dillon Weeks in Treatment: 1 Wound Status Wound Number: 1 Primary Pressure Ulcer Etiology: Wound Location: Left Toe Great Wound Open Wounding Event: Gradually Appeared Status: Date Acquired: 08/29/2017 Comorbid Cataracts, Anemia, Hypertension, Myocardial Weeks Of Treatment: 1 History: Infarction, Type I Diabetes, Rheumatoid Clustered Wound: No Arthritis Photos Photo Uploaded By: Roger Shelter on 10/06/2017 16:29:07 Wound Measurements Length: (cm) 1.8 Width: (cm) 1.8 Depth: (cm) 0.1 Area: (cm) 2.545 Volume: (cm) 0.254 % Reduction in Area: 41.1% %  Reduction in Volume: 41.2% Epithelialization: Medium (34-66%) Tunneling: No Undermining: No Wound Description Classification: Category/Stage II Wound Margin: Flat and Intact Exudate Amount: Medium Exudate Type: Serosanguineous Exudate Color: red, brown Foul Odor After Cleansing: No Slough/Fibrino Yes Wound Bed Granulation Amount: Small (1-33%) Exposed Structure Granulation Quality: Pink Fascia Exposed: No Necrotic Amount: Large (67-100%) Fat Layer (Subcutaneous Tissue) Exposed: Yes Necrotic Quality: Adherent Slough Tendon Exposed: No Muscle Exposed: No Joint Exposed: No Bone Exposed: No Periwound Skin Texture Impson, Kadyn W. ([id number]) Texture Color No Abnormalities Noted: No No Abnormalities Noted: No Callus: Yes Atrophie Blanche: No Crepitus: No Cyanosis: No Excoriation: No Ecchymosis: No Induration: No Erythema: No Rash: No Hemosiderin Staining: No Scarring: Yes Mottled: No Pallor: No Moisture Rubor: No No Abnormalities Noted: No Dry / Scaly: No Maceration: No Wound Preparation Ulcer Cleansing: Rinsed/Irrigated with Saline Topical Anesthetic Applied: Other: lidocaine 4%, Treatment Notes Wound #1 (Left Toe Great) 1. Cleansed with: Clean wound with Normal Saline 2. Anesthetic Topical Lidocaine 4% cream to wound bed prior to debridement 4. Dressing Applied: Santyl Ointment 5. Secondary  Dressing Applied Dry Gauze Gauze and Kerlix/Conform 6. Footwear/Offloading device applied Surgical shoe Electronic Signature(s) Signed: 10/06/2017 4:36:13 PM By: Roger Shelter Entered By: Roger Shelter on 10/06/2017 15:52:42 Peggs, Glee Arvin ([id number]) -------------------------------------------------------------------------------- Wound Assessment Details Patient Name: [name] Date of Service: 10/06/2017 3:30 PM Medical Record Number: [id number] Patient Account Number: [id number] Date of Birth/Sex: 1956-06-25 (61 y.o. Male) Treating RN: Roger Shelter Primary Care Gladiola Madore: Di Kindle Other Clinician: Referring Gennavieve Huq: Di Kindle Treating Ferrell Flam/Extender: Ricard Dillon Weeks in Treatment: 1 Wound Status Wound Number: 2 Primary Pressure Ulcer Etiology: Wound Location: Left Toe Second Wound Open Wounding Event: Gradually Appeared Status: Date Acquired: 08/29/2017 Comorbid Cataracts, Anemia, Hypertension, Myocardial Weeks Of Treatment: 1 History: Infarction, Type I Diabetes, Rheumatoid Clustered Wound: No Arthritis Photos Photo Uploaded By: Roger Shelter on 10/06/2017 16:29:08 Wound Measurements Length: (cm) 0.5 Width: (cm) 0.5 Depth: (cm) 0.1 Area: (cm) 0.196 Volume: (cm) 0.02 % Reduction in Area: 16.9% % Reduction in Volume: 16.7% Epithelialization: Large (67-100%) Tunneling: No Undermining: No Wound Description Classification: Category/Stage II Wound Margin: Flat and Intact Exudate Amount: None Present Foul Odor After Cleansing: No Slough/Fibrino No Wound Bed Granulation Amount: Large (67-100%) Exposed Structure Necrotic Amount: Small (1-33%) Fascia Exposed: No Necrotic Quality: Eschar Fat Layer (Subcutaneous Tissue) Exposed: No Tendon Exposed: No Muscle Exposed: No Joint Exposed: No Bone Exposed: No Periwound Skin Texture Texture Color No Abnormalities Noted: No No Abnormalities Noted: No Blizzard, Isa W. ([id number]) Callus:  Yes Atrophie Blanche: No Crepitus: No Cyanosis: No Excoriation: No Ecchymosis: No Induration: No Erythema: No Rash: No Hemosiderin Staining: No Scarring: No Mottled: No Pallor: No Moisture Rubor: No No Abnormalities Noted: No Dry / Scaly: No Temperature / Pain Maceration: No Temperature: No Abnormality Tenderness on Palpation: Yes Wound Preparation Ulcer Cleansing: Rinsed/Irrigated with Saline Topical Anesthetic Applied: Other: lidocaine 4%, Treatment Notes Wound #2 (Left Toe Second) 1. Cleansed with: Clean wound with Normal Saline 2. Anesthetic Topical Lidocaine 4% cream to wound bed prior to debridement 4. Dressing Applied: Santyl Ointment 5. Secondary Dressing Applied Dry Gauze Gauze and Kerlix/Conform 6. Footwear/Offloading device applied Surgical shoe Electronic Signature(s) Signed: 10/06/2017 4:36:13 PM By: Roger Shelter Entered By: Roger Shelter on 10/06/2017 15:53:10 Ciancio, Glee Arvin ([id number]) -------------------------------------------------------------------------------- Wound Assessment Details Patient Name: [name] Date of Service: 10/06/2017 3:30 PM Medical Record Number: [id number] Patient Account Number: [id number] Date of Birth/Sex: 29-Jan-1956 (61 y.o. Male) Treating RN: Roger Shelter Primary Care Leandro Berkowitz:  YU, JULIA Other Clinician: Referring Ciela Mahajan: Di Kindle Treating Jaskiran Pata/Extender: Ricard Dillon Weeks in Treatment: 1 Wound Status Wound Number: 3 Primary Pressure Ulcer Etiology: Wound Location: Left Toe Third Wound Open Wounding Event: Gradually Appeared Status: Date Acquired: 08/30/2017 Comorbid Cataracts, Anemia, Hypertension, Myocardial Weeks Of Treatment: 1 History: Infarction, Type I Diabetes, Rheumatoid Clustered Wound: No Arthritis Photos Photo Uploaded By: Roger Shelter on 10/06/2017 16:30:28 Wound Measurements Length: (cm) 0.5 Width: (cm) 0.5 Depth: (cm) 0.1 Area: (cm) 0.196 Volume: (cm)  0.02 % Reduction in Area: 81.1% % Reduction in Volume: 80.8% Epithelialization: Large (67-100%) Tunneling: No Undermining: No Wound Description Classification: Category/Stage II Wound Margin: Flat and Intact Exudate Amount: None Present Foul Odor After Cleansing: No Slough/Fibrino Yes Wound Bed Granulation Amount: Large (67-100%) Exposed Structure Necrotic Amount: Small (1-33%) Fascia Exposed: No Necrotic Quality: Eschar Fat Layer (Subcutaneous Tissue) Exposed: No Tendon Exposed: No Muscle Exposed: No Joint Exposed: No Bone Exposed: No Periwound Skin Texture Texture Color No Abnormalities Noted: No No Abnormalities Noted: No Moncada, Landyn W. ([id number]) Callus: Yes Atrophie Blanche: No Crepitus: No Cyanosis: No Excoriation: No Ecchymosis: No Induration: No Erythema: No Rash: No Hemosiderin Staining: No Scarring: Yes Mottled: No Pallor: No Moisture Rubor: No No Abnormalities Noted: No Dry / Scaly: No Temperature / Pain Maceration: No Temperature: No Abnormality Tenderness on Palpation: Yes Wound Preparation Ulcer Cleansing: Rinsed/Irrigated with Saline Topical Anesthetic Applied: Other: lidocaine 4%, Treatment Notes Wound #3 (Left Toe Third) 1. Cleansed with: Clean wound with Normal Saline 2. Anesthetic Topical Lidocaine 4% cream to wound bed prior to debridement 4. Dressing Applied: Santyl Ointment 5. Secondary Dressing Applied Dry Gauze Gauze and Kerlix/Conform 6. Footwear/Offloading device applied Surgical shoe Electronic Signature(s) Signed: 10/06/2017 4:36:13 PM By: Roger Shelter Entered By: Roger Shelter on 10/06/2017 15:53:36 Pettway, Glee Arvin ([id number]) -------------------------------------------------------------------------------- Wound Assessment Details Patient Name: [name] Date of Service: 10/06/2017 3:30 PM Medical Record Number: [id number] Patient Account Number: [id number] Date of Birth/Sex: March 30, 1956 (61 y.o.  Male) Treating RN: Roger Shelter Primary Care Maurisha Mongeau: Di Kindle Other Clinician: Referring Maurissa Ambrose: Di Kindle Treating Morrigan Wickens/Extender: Ricard Dillon Weeks in Treatment: 1 Wound Status Wound Number: 4 Primary Pressure Ulcer Etiology: Wound Location: Left Toe Fourth Wound Open Wounding Event: Gradually Appeared Status: Date Acquired: 08/30/2017 Comorbid Cataracts, Anemia, Hypertension, Myocardial Weeks Of Treatment: 1 History: Infarction, Type I Diabetes, Rheumatoid Clustered Wound: No Arthritis Photos Photo Uploaded By: Roger Shelter on 10/06/2017 16:30:46 Wound Measurements Length: (cm) 1.2 Width: (cm) 1.4 Depth: (cm) 0.1 Area: (cm) 1.319 Volume: (cm) 0.132 % Reduction in Area: 12.5% % Reduction in Volume: 12.6% Epithelialization: Large (67-100%) Tunneling: No Undermining: No Wound Description Classification: Category/Stage II Wound Margin: Flat and Intact Exudate Amount: Medium Exudate Type: Serosanguineous Exudate Color: red, brown Foul Odor After Cleansing: No Slough/Fibrino Yes Wound Bed Granulation Amount: Medium (34-66%) Exposed Structure Granulation Quality: Red Fascia Exposed: No Necrotic Amount: Medium (34-66%) Fat Layer (Subcutaneous Tissue) Exposed: Yes Necrotic Quality: Eschar, Adherent Slough Tendon Exposed: No Muscle Exposed: No Joint Exposed: No Bone Exposed: No Periwound Skin Texture Clites, Oluwanifemi W. ([id number]) Texture Color No Abnormalities Noted: No No Abnormalities Noted: No Callus: Yes Atrophie Blanche: No Crepitus: No Cyanosis: No Excoriation: No Ecchymosis: No Induration: No Erythema: No Rash: No Hemosiderin Staining: No Scarring: Yes Mottled: No Pallor: No Moisture Rubor: No No Abnormalities Noted: No Dry / Scaly: No Maceration: No Wound Preparation Ulcer Cleansing: Rinsed/Irrigated with Saline Topical Anesthetic Applied: Other: lidocaine 4%, Treatment Notes Wound #4 (Left Toe Fourth) 1. Cleansed  with:  Clean wound with Normal Saline 2. Anesthetic Topical Lidocaine 4% cream to wound bed prior to debridement 4. Dressing Applied: Santyl Ointment 5. Secondary Dressing Applied Dry Gauze Gauze and Kerlix/Conform 6. Footwear/Offloading device applied Surgical shoe Electronic Signature(s) Signed: 10/06/2017 4:36:13 PM By: Roger Shelter Entered By: Roger Shelter on 10/06/2017 15:53:57 Pappalardo, Glee Arvin ([id number]) -------------------------------------------------------------------------------- Wound Assessment Details Patient Name: [name] Date of Service: 10/06/2017 3:30 PM Medical Record Number: [id number] Patient Account Number: [id number] Date of Birth/Sex: Jun 18, 1956 (61 y.o. Male) Treating RN: Roger Shelter Primary Care Masato Pettie: Di Kindle Other Clinician: Referring Cade Olberding: Di Kindle Treating Yamina Lenis/Extender: Ricard Dillon Weeks in Treatment: 1 Wound Status Wound Number: 5 Primary Pressure Ulcer Etiology: Wound Location: Left Toe Fifth Wound Open Wounding Event: Gradually Appeared Status: Date Acquired: 08/30/2017 Comorbid Cataracts, Anemia, Hypertension, Myocardial Weeks Of Treatment: 1 History: Infarction, Type I Diabetes, Rheumatoid Clustered Wound: No Arthritis Photos Photo Uploaded By: Roger Shelter on 10/06/2017 16:31:04 Wound Measurements Length: (cm) 3.1 Width: (cm) 1.6 Depth: (cm) 0.2 Area: (cm) 3.896 Volume: (cm) 0.779 % Reduction in Area: 0% % Reduction in Volume: -99.7% Epithelialization: Large (67-100%) Tunneling: No Undermining: No Wound Description Classification: Category/Stage II Wound Margin: Flat and Intact Exudate Amount: Medium Exudate Type: Serosanguineous Exudate Color: red, brown Foul Odor After Cleansing: No Slough/Fibrino Yes Wound Bed Granulation Amount: Medium (34-66%) Exposed Structure Granulation Quality: Red Fascia Exposed: No Necrotic Amount: Medium (34-66%) Fat Layer (Subcutaneous Tissue)  Exposed: Yes Necrotic Quality: Eschar, Adherent Slough Tendon Exposed: No Muscle Exposed: No Joint Exposed: No Bone Exposed: No Periwound Skin Texture Hennings, Thailan W. ([id number]) Texture Color No Abnormalities Noted: No No Abnormalities Noted: No Callus: Yes Atrophie Blanche: No Crepitus: No Cyanosis: No Excoriation: No Ecchymosis: No Induration: Yes Erythema: No Rash: No Hemosiderin Staining: No Scarring: Yes Mottled: No Pallor: No Moisture Rubor: No No Abnormalities Noted: No Dry / Scaly: No Temperature / Pain Maceration: No Temperature: No Abnormality Wound Preparation Ulcer Cleansing: Rinsed/Irrigated with Saline Topical Anesthetic Applied: Other: lidocaine 4%, Treatment Notes Wound #5 (Left Toe Fifth) 1. Cleansed with: Clean wound with Normal Saline 2. Anesthetic Topical Lidocaine 4% cream to wound bed prior to debridement 4. Dressing Applied: Santyl Ointment 5. Secondary Dressing Applied Dry Gauze Gauze and Kerlix/Conform 6. Footwear/Offloading device applied Surgical shoe Electronic Signature(s) Signed: 10/06/2017 4:36:13 PM By: Roger Shelter Entered By: Roger Shelter on 10/06/2017 15:54:24 KATO, WIECZOREK ([id number]) -------------------------------------------------------------------------------- Vitals Details Patient Name: [name] Date of Service: 10/06/2017 3:30 PM Medical Record Number: [id number] Patient Account Number: [id number] Date of Birth/Sex: 27-Jul-1956 (61 y.o. Male) Treating RN: Roger Shelter Primary Care Delight Bickle: Di Kindle Other Clinician: Referring Emet Rafanan: Di Kindle Treating Collyn Selk/Extender: Ricard Dillon Weeks in Treatment: 1 Vital Signs Time Taken: 03:40 Temperature (F): 98.5 Height (in): 67 Pulse (bpm): 75 Weight (lbs): 150 Respiratory Rate (breaths/min): 18 Body Mass Index (BMI): 23.5 Blood Pressure (mmHg): 147/57 Reference Range: 80 - 120 mg / dl Electronic Signature(s) Signed: 10/06/2017 4:36:13  PM By: Roger Shelter Entered By: Roger Shelter on 10/06/2017 15:41:27

## 2017-10-08 ENCOUNTER — Encounter (HOSPITAL_COMMUNITY): Payer: Medicare Other

## 2017-10-11 ENCOUNTER — Encounter (HOSPITAL_COMMUNITY): Payer: Medicare Other

## 2017-10-13 ENCOUNTER — Encounter: Payer: Medicare Other | Admitting: Internal Medicine

## 2017-10-13 ENCOUNTER — Encounter (HOSPITAL_COMMUNITY): Payer: Medicare Other

## 2017-10-13 DIAGNOSIS — E11621 Type 2 diabetes mellitus with foot ulcer: Secondary | ICD-10-CM | POA: Diagnosis not present

## 2017-10-14 ENCOUNTER — Ambulatory Visit (HOSPITAL_COMMUNITY)
Admission: RE | Admit: 2017-10-14 | Discharge: 2017-10-14 | Disposition: A | Discharge status: Home / Self Care | Payer: Medicare Other | Source: Ambulatory Visit | Attending: Cardiology | Admitting: Cardiology

## 2017-10-14 ENCOUNTER — Other Ambulatory Visit
Admission: RE | Admit: 2017-10-14 | Discharge: 2017-10-14 | Disposition: A | Discharge status: Home / Self Care | Payer: Medicare Other | Source: Ambulatory Visit | Attending: Internal Medicine | Admitting: Internal Medicine

## 2017-10-14 DIAGNOSIS — E11621 Type 2 diabetes mellitus with foot ulcer: Secondary | ICD-10-CM | POA: Diagnosis not present

## 2017-10-14 DIAGNOSIS — L97509 Non-pressure chronic ulcer of other part of unspecified foot with unspecified severity: Secondary | ICD-10-CM | POA: Insufficient documentation

## 2017-10-14 DIAGNOSIS — Z87891 Personal history of nicotine dependence: Secondary | ICD-10-CM | POA: Insufficient documentation

## 2017-10-14 DIAGNOSIS — I2510 Atherosclerotic heart disease of native coronary artery without angina pectoris: Secondary | ICD-10-CM | POA: Diagnosis not present

## 2017-10-14 DIAGNOSIS — L089 Local infection of the skin and subcutaneous tissue, unspecified: Secondary | ICD-10-CM | POA: Diagnosis present

## 2017-10-14 DIAGNOSIS — I739 Peripheral vascular disease, unspecified: Secondary | ICD-10-CM

## 2017-10-14 DIAGNOSIS — E1151 Type 2 diabetes mellitus with diabetic peripheral angiopathy without gangrene: Secondary | ICD-10-CM | POA: Diagnosis not present

## 2017-10-14 DIAGNOSIS — I10 Essential (primary) hypertension: Secondary | ICD-10-CM | POA: Insufficient documentation

## 2017-10-14 DIAGNOSIS — E785 Hyperlipidemia, unspecified: Secondary | ICD-10-CM | POA: Diagnosis not present

## 2017-10-15 ENCOUNTER — Encounter (HOSPITAL_COMMUNITY): Payer: Medicare Other

## 2017-10-16 LAB — AEROBIC CULTURE W GRAM STAIN (SUPERFICIAL SPECIMEN)

## 2017-10-16 LAB — AEROBIC CULTURE  (SUPERFICIAL SPECIMEN)

## 2017-10-17 NOTE — Progress Notes (Signed)
DAMONTRE, MILLEA ([id number]) Visit Report for 10/13/2017 Arrival Information Details Patient Name: Kenneth [name], Kenneth [name] Date of Service: 10/13/2017 3:30 PM Medical Record Number: [id number] Patient Account Number: [id number] Date of Birth/Sex: [birth year] (61 y.o. Male) Treating RN: Carolyne Fiscal, Debi Primary Care Ashleigh Arya: Di Kindle Other Clinician: Referring Pansey Pinheiro: Di Kindle Treating Nirvana Blanchett/Extender: Tito Dine in Treatment: 2 Visit Information History Since Last Visit All ordered tests and consults were completed: No Patient Arrived: Ambulatory Added or deleted any medications: No Arrival Time: 15:31 Any new allergies or adverse reactions: No Accompanied By: daughter, interpreter Had a fall or experienced change in No activities of daily living that may affect Transfer Assistance: None risk of falls: Patient Identification Verified: Yes Signs or symptoms of abuse/neglect since last visito No Secondary Verification Process Yes Hospitalized since last visit: No Completed: Has Dressing in Place as Prescribed: Yes Patient Requires Transmission-Based No Precautions: Pain Present Now: No Patient Has Alerts: Yes Patient Alerts: ABI RIght- 0.96 ABI Left- 0.99 Electronic Signature(s) Signed: 10/15/2017 4:17:47 PM By: Alric Quan Entered By: Alric Quan on 10/13/2017 15:33:21 Kenneth [name] ([id number]) -------------------------------------------------------------------------------- Encounter Discharge Information Details Patient Name: Kenneth [name] Date of Service: 10/13/2017 3:30 PM Medical Record Number: [id number] Patient Account Number: [id number] Date of Birth/Sex: 13-Feb-1956 (61 y.o. Male) Treating RN: Carolyne Fiscal, Debi Primary Care Dayla Gasca: Di Kindle Other Clinician: Referring Janasha Barkalow: Di Kindle Treating Neyda Durango/Extender: Tito Dine in Treatment: 2 Encounter Discharge Information Items Discharge Pain Level: 0 Discharge Condition:  Stable Ambulatory Status: Ambulatory Discharge Destination: Home Transportation: Private Auto Accompanied By: family Schedule Follow-up Appointment: Yes Medication Reconciliation completed and No provided to Patient/Care Asuna Peth: Provided on Clinical Summary of Care: 10/13/2017 Form Type Recipient Paper Patient JC Electronic Signature(s) Signed: 10/15/2017 4:17:47 PM By: Alric Quan Entered By: Alric Quan on 10/13/2017 16:10:51 Kenneth [name] ([id number]) -------------------------------------------------------------------------------- Lower Extremity Assessment Details Patient Name: Kenneth [name] Date of Service: 10/13/2017 3:30 PM Medical Record Number: [id number] Patient Account Number: [id number] Date of Birth/Sex: [birth year] (61 y.o. Male) Treating RN: Carolyne Fiscal, Debi Primary Care Jessejames Steelman: Di Kindle Other Clinician: Referring Bodhi Moradi: Di Kindle Treating Tajae Rybicki/Extender: Ricard Dillon Weeks in Treatment: 2 Vascular Assessment Pulses: Dorsalis Pedis Palpable: [Right:Yes] Posterior Tibial Extremity colors, hair growth, and conditions: Extremity Color: [Right:Normal] Temperature of Extremity: [Right:Warm] Capillary Refill: [Right:< 3 seconds] Toe Nail Assessment Left: Right: Thick: No Discolored: No Deformed: No Improper Length and Hygiene: No Electronic Signature(s) Signed: 10/15/2017 4:17:47 PM By: Alric Quan Entered By: Alric Quan on 10/13/2017 15:45:47 Haskett, Glee Arvin ([id number]) -------------------------------------------------------------------------------- Multi Wound Chart Details Patient Name: Kenneth [name] Date of Service: 10/13/2017 3:30 PM Medical Record Number: [id number] Patient Account Number: [id number] Date of Birth/Sex: Jul 18, 1956 (61 y.o. Male) Treating RN: Carolyne Fiscal, Debi Primary Care Kysha Muralles: Di Kindle Other Clinician: Referring Rue Valladares: Di Kindle Treating Osmin Welz/Extender: Ricard Dillon Weeks in Treatment:  2 Vital Signs Height(in): 30 Pulse(bpm): 53 Weight(lbs): 150 Blood Pressure(mmHg): 133/54 Body Mass Index(BMI): 23 Temperature(F): 98.4 Respiratory Rate 18 (breaths/min): Photos: Wound Location: Left Toe Great Left Toe Second Left Toe Third Wounding Event: Gradually Appeared Gradually Appeared Gradually Appeared Primary Etiology: Pressure Ulcer Pressure Ulcer Pressure Ulcer Comorbid History: Cataracts, Anemia, Cataracts, Anemia, Cataracts, Anemia, Hypertension, Myocardial Hypertension, Myocardial Hypertension, Myocardial Infarction, Type I Diabetes, Infarction, Type I Diabetes, Infarction, Type I Diabetes, Rheumatoid Arthritis Rheumatoid Arthritis Rheumatoid Arthritis Date Acquired: 08/29/2017 08/29/2017 08/30/2017 Weeks of Treatment: 2 2 2  Wound Status: Open Open Open Measurements L x W x D 1.5x1.3x0.2 0.5x0.5x0.1 0.1x0.1x0.1 (cm) Area (cm) : 1.532  0.196 0.008 Volume (cm) : 0.306 0.02 0.001 % Reduction in Area: 64.50% 16.90% 99.20% % Reduction in Volume: 29.20% 16.70% 99.00% Starting Position 1 12 (o'clock): Ending Position 1 12 (o'clock): Maximum Distance 1 (cm): 0.2 Undermining: Yes No N/A Classification: Category/Stage II Category/Stage II Category/Stage II Exudate Amount: Large None Present None Present Exudate Type: Serosanguineous N/A N/A Exudate Color: red, brown N/A N/A Wound Margin: Flat and Intact Flat and Intact Flat and Intact Granulation Amount: None Present (0%) None Present (0%) None Present (0%) Granulation Quality: N/A N/A N/A TIANDRE, TEALL. ([id number]) Necrotic Amount: Large (67-100%) Large (67-100%) Small (1-33%) Necrotic Tissue: Adherent Slough Eschar Eschar Exposed Structures: Fat Layer (Subcutaneous Fascia: No Fascia: No Tissue) Exposed: Yes Fat Layer (Subcutaneous Fat Layer (Subcutaneous Fascia: No Tissue) Exposed: No Tissue) Exposed: No Tendon: No Tendon: No Tendon: No Muscle: No Muscle: No Muscle: No Joint: No Joint: No Joint:  No Bone: No Bone: No Bone: No Epithelialization: Medium (34-66%) None Large (67-100%) Debridement: Debridement ([id number]) N/A N/A Pre-procedure 15:49 N/A N/A Verification/Time Out Taken: Pain Control: Lidocaine 4% Topical Solution N/A N/A Tissue Debrided: Fibrin/Slough, Exudates, N/A N/A Subcutaneous Level: Skin/Subcutaneous Tissue N/A N/A Debridement Area (sq cm): 1.95 N/A N/A Instrument: Curette N/A N/A Bleeding: Minimum N/A N/A Hemostasis Achieved: Pressure N/A N/A Procedural Pain: 0 N/A N/A Post Procedural Pain: 0 N/A N/A Debridement Treatment Procedure was tolerated well N/A N/A Response: Post Debridement 1.5x1.3x0.3 N/A N/A Measurements L x W x D (cm) Post Debridement Volume: 0.459 N/A N/A (cm) Post Debridement Stage: Category/Stage II N/A N/A Periwound Skin Texture: Callus: Yes Callus: Yes Callus: Yes Scarring: Yes Excoriation: No Scarring: Yes Excoriation: No Induration: No Excoriation: No Induration: No Crepitus: No Induration: No Crepitus: No Rash: No Crepitus: No Rash: No Scarring: No Rash: No Periwound Skin Moisture: Maceration: Yes Maceration: No Maceration: No Dry/Scaly: No Dry/Scaly: No Dry/Scaly: No Periwound Skin Color: Atrophie Blanche: No Atrophie Blanche: No Atrophie Blanche: No Cyanosis: No Cyanosis: No Cyanosis: No Ecchymosis: No Ecchymosis: No Ecchymosis: No Erythema: No Erythema: No Erythema: No Hemosiderin Staining: No Hemosiderin Staining: No Hemosiderin Staining: No Mottled: No Mottled: No Mottled: No Pallor: No Pallor: No Pallor: No Rubor: No Rubor: No Rubor: No Temperature: No Abnormality No Abnormality No Abnormality Tenderness on Palpation: No Yes Yes Wound Preparation: Ulcer Cleansing: Ulcer Cleansing: Ulcer Cleansing: Rinsed/Irrigated with Saline Rinsed/Irrigated with Saline Rinsed/Irrigated with Saline Topical Anesthetic Applied: Topical Anesthetic Applied: Topical Anesthetic Applied: Other:  lidocaine 4% Other: lidocaine 4% Other: lidocaine 4% Procedures Performed: Debridement N/A N/A Wound Number: 4 5 N/A Photos: N/A IKER, NUTTALL ([id number]) Wound Location: Left Toe Fourth Left Toe Fifth N/A Wounding Event: Gradually Appeared Gradually Appeared N/A Primary Etiology: Pressure Ulcer Pressure Ulcer N/A Comorbid History: Cataracts, Anemia, Cataracts, Anemia, N/A Hypertension, Myocardial Hypertension, Myocardial Infarction, Type I Diabetes, Infarction, Type I Diabetes, Rheumatoid Arthritis Rheumatoid Arthritis Date Acquired: 08/30/2017 08/30/2017 N/A Weeks of Treatment: 2 2 N/A Wound Status: Open Open N/A Measurements L x W x D 0.3x0.4x0.3 1.5x0.9x0.2 N/A (cm) Area (cm) : 0.094 1.06 N/A Volume (cm) : 0.028 0.212 N/A % Reduction in Area: 93.80% 72.80% N/A % Reduction in Volume: 81.50% 45.60% N/A Starting Position 1 10 9  (o'clock): Ending Position 1 1 12  (o'clock): Maximum Distance 1 (cm): 0.3 0.2 Undermining: Yes Yes N/A Classification: Category/Stage II Category/Stage II N/A Exudate Amount: Large Large N/A Exudate Type: Serosanguineous Serosanguineous N/A Exudate Color: red, brown red, brown N/A Wound Margin: Flat and Intact Flat and Intact N/A Granulation Amount: None Present (0%) Medium (  34-66%) N/A Granulation Quality: N/A Red N/A Necrotic Amount: Large (67-100%) Medium (34-66%) N/A Necrotic Tissue: Eschar, Adherent Slough Eschar, Adherent Slough N/A Exposed Structures: Fat Layer (Subcutaneous Fat Layer (Subcutaneous N/A Tissue) Exposed: Yes Tissue) Exposed: Yes Fascia: No Fascia: No Tendon: No Tendon: No Muscle: No Muscle: No Joint: No Joint: No Bone: No Bone: No Epithelialization: Large (67-100%) Large (67-100%) N/A Debridement: Debridement ([id number]) Debridement ([id number]) N/A Pre-procedure 15:49 15:49 N/A Verification/Time Out Taken: Pain Control: Lidocaine 4% Topical Solution Lidocaine 4% Topical Solution N/A Tissue Debrided:  Fibrin/Slough, Exudates, Fibrin/Slough, Exudates, N/A Subcutaneous Subcutaneous Level: Skin/Subcutaneous Tissue Skin/Subcutaneous Tissue N/A Debridement Area (sq cm): 0.12 1.35 N/A Instrument: Curette Curette N/A Specimen: Swab None N/A 1 N/A N/A JOVONTA, LEVIT. ([id number]) Number of Specimens Taken: Bleeding: Minimum Minimum N/A Hemostasis Achieved: Pressure Pressure N/A Procedural Pain: 0 0 N/A Post Procedural Pain: 0 0 N/A Debridement Treatment Procedure was tolerated well Procedure was tolerated well N/A Response: Post Debridement 0.3x0.4x0.4 1.5x0.9x0.3 N/A Measurements L x W x D (cm) Post Debridement Volume: 0.038 0.318 N/A (cm) Post Debridement Stage: Category/Stage II Category/Stage II N/A Periwound Skin Texture: Callus: Yes Induration: Yes N/A Scarring: Yes Callus: Yes Excoriation: No Scarring: Yes Induration: No Excoriation: No Crepitus: No Crepitus: No Rash: No Rash: No Periwound Skin Moisture: Maceration: No Maceration: No N/A Dry/Scaly: No Dry/Scaly: No Periwound Skin Color: Atrophie Blanche: No Atrophie Blanche: No N/A Cyanosis: No Cyanosis: No Ecchymosis: No Ecchymosis: No Erythema: No Erythema: No Hemosiderin Staining: No Hemosiderin Staining: No Mottled: No Mottled: No Pallor: No Pallor: No Rubor: No Rubor: No Temperature: N/A No Abnormality N/A Tenderness on Palpation: No No N/A Wound Preparation: Ulcer Cleansing: Ulcer Cleansing: N/A Rinsed/Irrigated with Saline Rinsed/Irrigated with Saline Topical Anesthetic Applied: Topical Anesthetic Applied: Other: lidocaine 4% Other: lidocaine 4% Procedures Performed: Debridement Debridement N/A Treatment Notes Wound #1 (Left Toe Great) 1. Cleansed with: Clean wound with Normal Saline 2. Anesthetic Topical Lidocaine 4% cream to wound bed prior to debridement 4. Dressing Applied: Santyl Ointment 5. Secondary Dressing Applied ABD Pad Dry Gauze Notes kerlix and coban wrap Wound #2  (Left Toe Second) 1. Cleansed with: Clean wound with Normal Saline Villard, Zubayr W. ([id number]) 2. Anesthetic Topical Lidocaine 4% cream to wound bed prior to debridement 4. Dressing Applied: Other dressing (specify in notes) 5. Secondary Dressing Applied Dry Gauze Notes kerlix and coban wrap Wound #3 (Left Toe Third) 1. Cleansed with: Clean wound with Normal Saline 2. Anesthetic Topical Lidocaine 4% cream to wound bed prior to debridement 4. Dressing Applied: Other dressing (specify in notes) 5. Secondary Dressing Applied Dry Gauze Notes kerlix and coban wrap Wound #4 (Left Toe Fourth) 1. Cleansed with: Clean wound with Normal Saline 2. Anesthetic Topical Lidocaine 4% cream to wound bed prior to debridement 4. Dressing Applied: Santyl Ointment 5. Secondary Dressing Applied ABD Pad Dry Gauze Notes kerlix and coban wrap Wound #5 (Left Toe Fifth) 1. Cleansed with: Clean wound with Normal Saline 2. Anesthetic Topical Lidocaine 4% cream to wound bed prior to debridement 4. Dressing Applied: Other dressing (specify in notes) 5. Secondary Dressing Applied Dry Gauze Notes kerlix and coban wrap Electronic Signature(s) Signed: 10/13/2017 4:49:25 PM By: Linton Ham MD Entered By: Linton Ham on 10/13/2017 16:41:37 Witzke, Glee Arvin ([id number]) RAMSES, KLECKA ([id number]) -------------------------------------------------------------------------------- [place] Details Patient Name: Kenneth [name] Date of Service: 10/13/2017 3:30 PM Medical Record Number: [id number] Patient Account Number: [id number] Date of Birth/Sex: 05/17/1956 (61 y.o. Male) Treating RN: Carolyne Fiscal, Debi Primary Care Jamael Hoffmann: Di Kindle  Other Clinician: Referring Baldwin Racicot: Di Kindle Treating Emmalynne Courtney/Extender: Tito Dine in Treatment: 2 Active Inactive ` Orientation to the Wound Care Program Nursing Diagnoses: Knowledge deficit related to the wound healing center  program Goals: Patient/caregiver will verbalize understanding of the [place] Date Initiated: 09/29/2017 Target Resolution Date: 10/19/2017 Goal Status: Active Interventions: Provide education on orientation to the wound center Notes: ` Wound/Skin Impairment Nursing Diagnoses: Impaired tissue integrity Goals: Patient/caregiver will verbalize understanding of skin care regimen Date Initiated: 09/29/2017 Target Resolution Date: 10/19/2017 Goal Status: Active Ulcer/skin breakdown will have a volume reduction of 30% by week 4 Date Initiated: 09/29/2017 Target Resolution Date: 10/19/2017 Goal Status: Active Interventions: Assess ulceration(s) every visit Treatment Activities: Skin care regimen initiated : 09/29/2017 Notes: Electronic Signature(s) Signed: 10/15/2017 4:17:47 PM By: Alric Quan Entered By: Alric Quan on 10/13/2017 Kearney, Glee Arvin ([id number]) -------------------------------------------------------------------------------- Pain Assessment Details Patient Name: Kenneth [name] Date of Service: 10/13/2017 3:30 PM Medical Record Number: [id number] Patient Account Number: [id number] Date of Birth/Sex: [birth year] (61 y.o. Male) Treating RN: Carolyne Fiscal, Debi Primary Care Bush Murdoch: Di Kindle Other Clinician: Referring Eboni Coval: Di Kindle Treating Shain Pauwels/Extender: Ricard Dillon Weeks in Treatment: 2 Active Problems Location of Pain Severity and Description of Pain Patient Has Paino No Site Locations Pain Management and Medication Current Pain Management: Electronic Signature(s) Signed: 10/15/2017 4:17:47 PM By: Alric Quan Entered By: Alric Quan on 10/13/2017 15:34:06 Kenneth [name] ([id number]) -------------------------------------------------------------------------------- Patient/Caregiver Education Details Patient Name: Kenneth [name] Date of Service: 10/13/2017 3:30 PM Medical Record Number: [id number] Patient Account Number:  [id number] Date of Birth/Gender: Nov 04, 1955 (61 y.o. Male) Treating RN: Carolyne Fiscal, Debi Primary Care Physician: Di Kindle Other Clinician: Referring Physician: Di Kindle Treating Physician/Extender: Tito Dine in Treatment: 2 Education Assessment Education Provided To: Patient Education Topics Provided Wound/Skin Impairment: Handouts: Caring for Your Ulcer, Other: change dressing as ordered Methods: Demonstration, Explain/Verbal Responses: State content correctly Electronic Signature(s) Signed: 10/15/2017 4:17:47 PM By: Alric Quan Entered By: Alric Quan on 10/13/2017 16:17:10 Kenneth [name] ([id number]) -------------------------------------------------------------------------------- Wound Assessment Details Patient Name: Kenneth [name] Date of Service: 10/13/2017 3:30 PM Medical Record Number: [id number] Patient Account Number: [id number] Date of Birth/Sex: 17-Jan-1956 (61 y.o. Male) Treating RN: Carolyne Fiscal, Debi Primary Care Kirat Mezquita: Di Kindle Other Clinician: Referring Quanetta Truss: Di Kindle Treating Azavion Bouillon/Extender: Ricard Dillon Weeks in Treatment: 2 Wound Status Wound Number: 1 Primary Pressure Ulcer Etiology: Wound Location: Left Toe Great Wound Open Wounding Event: Gradually Appeared Status: Date Acquired: 08/29/2017 Comorbid Cataracts, Anemia, Hypertension, Myocardial Weeks Of Treatment: 2 History: Infarction, Type I Diabetes, Rheumatoid Clustered Wound: No Arthritis Photos Photo Uploaded By: Alric Quan on 10/13/2017 16:38:47 Wound Measurements Length: (cm) 1.5 Width: (cm) 1.3 Depth: (cm) 0.2 Area: (cm) 1.532 Volume: (cm) 0.306 % Reduction in Area: 64.5% % Reduction in Volume: 29.2% Epithelialization: Medium (34-66%) Tunneling: No Undermining: Yes Starting Position (o'clock): 12 Ending Position (o'clock): 12 Maximum Distance: (cm) 0.2 Wound Description Classification: Category/Stage II Wound Margin: Flat and  Intact Exudate Amount: Large Exudate Type: Serosanguineous Exudate Color: red, brown Foul Odor After Cleansing: No Slough/Fibrino Yes Wound Bed Granulation Amount: None Present (0%) Exposed Structure Necrotic Amount: Large (67-100%) Fascia Exposed: No Necrotic Quality: Adherent Slough Fat Layer (Subcutaneous Tissue) Exposed: Yes Tendon Exposed: No Muscle Exposed: No Macknight, Guilherme W. ([id number]) Joint Exposed: No Bone Exposed: No Periwound Skin Texture Texture Color No Abnormalities Noted: No No Abnormalities Noted: No Callus: Yes Atrophie Blanche: No Crepitus: No Cyanosis: No Excoriation: No Ecchymosis: No Induration: No  Erythema: No Rash: No Hemosiderin Staining: No Scarring: Yes Mottled: No Pallor: No Moisture Rubor: No No Abnormalities Noted: No Dry / Scaly: No Temperature / Pain Maceration: Yes Temperature: No Abnormality Wound Preparation Ulcer Cleansing: Rinsed/Irrigated with Saline Topical Anesthetic Applied: Other: lidocaine 4%, Treatment Notes Wound #1 (Left Toe Great) 1. Cleansed with: Clean wound with Normal Saline 2. Anesthetic Topical Lidocaine 4% cream to wound bed prior to debridement 4. Dressing Applied: Santyl Ointment 5. Secondary Dressing Applied ABD Pad Dry Gauze Notes kerlix and coban wrap Electronic Signature(s) Signed: 10/15/2017 4:17:47 PM By: Alric Quan Entered By: Alric Quan on 10/13/2017 15:40:50 Kenneth [name] ([id number]) -------------------------------------------------------------------------------- Wound Assessment Details Patient Name: Kenneth [name] Date of Service: 10/13/2017 3:30 PM Medical Record Number: [id number] Patient Account Number: [id number] Date of Birth/Sex: 1955/10/28 (61 y.o. Male) Treating RN: Carolyne Fiscal, Debi Primary Care Amad Mau: Di Kindle Other Clinician: Referring Abriana Saltos: Di Kindle Treating Xsavier Seeley/Extender: Ricard Dillon Weeks in Treatment: 2 Wound Status Wound Number: 2 Primary  Pressure Ulcer Etiology: Wound Location: Left Toe Second Wound Open Wounding Event: Gradually Appeared Status: Date Acquired: 08/29/2017 Comorbid Cataracts, Anemia, Hypertension, Myocardial Weeks Of Treatment: 2 History: Infarction, Type I Diabetes, Rheumatoid Clustered Wound: No Arthritis Photos Photo Uploaded By: Alric Quan on 10/13/2017 16:39:11 Wound Measurements Length: (cm) 0.5 Width: (cm) 0.5 Depth: (cm) 0.1 Area: (cm) 0.196 Volume: (cm) 0.02 % Reduction in Area: 16.9% % Reduction in Volume: 16.7% Epithelialization: None Tunneling: No Undermining: No Wound Description Classification: Category/Stage II Wound Margin: Flat and Intact Exudate Amount: None Present Foul Odor After Cleansing: No Slough/Fibrino No Wound Bed Granulation Amount: None Present (0%) Exposed Structure Necrotic Amount: Large (67-100%) Fascia Exposed: No Necrotic Quality: Eschar Fat Layer (Subcutaneous Tissue) Exposed: No Tendon Exposed: No Muscle Exposed: No Joint Exposed: No Bone Exposed: No Periwound Skin Texture Texture Color No Abnormalities Noted: No No Abnormalities Noted: No Kinter, Kendrick W. ([id number]) Callus: Yes Atrophie Blanche: No Crepitus: No Cyanosis: No Excoriation: No Ecchymosis: No Induration: No Erythema: No Rash: No Hemosiderin Staining: No Scarring: No Mottled: No Pallor: No Moisture Rubor: No No Abnormalities Noted: No Dry / Scaly: No Temperature / Pain Maceration: No Temperature: No Abnormality Tenderness on Palpation: Yes Wound Preparation Ulcer Cleansing: Rinsed/Irrigated with Saline Topical Anesthetic Applied: Other: lidocaine 4%, Treatment Notes Wound #2 (Left Toe Second) 1. Cleansed with: Clean wound with Normal Saline 2. Anesthetic Topical Lidocaine 4% cream to wound bed prior to debridement 4. Dressing Applied: Other dressing (specify in notes) 5. Secondary Dressing Applied Dry Gauze Notes kerlix and coban wrap Electronic  Signature(s) Signed: 10/15/2017 4:17:47 PM By: Alric Quan Entered By: Alric Quan on 10/13/2017 15:41:25 SHYLOH, KRINKE ([id number]) -------------------------------------------------------------------------------- Wound Assessment Details Patient Name: Kenneth [name] Date of Service: 10/13/2017 3:30 PM Medical Record Number: [id number] Patient Account Number: [id number] Date of Birth/Sex: 03/17/1956 (61 y.o. Male) Treating RN: Carolyne Fiscal, Debi Primary Care Jaqueline Uber: Di Kindle Other Clinician: Referring Ralynn San: Di Kindle Treating Latrica Clowers/Extender: Ricard Dillon Weeks in Treatment: 2 Wound Status Wound Number: 3 Primary Pressure Ulcer Etiology: Wound Location: Left Toe Third Wound Open Wounding Event: Gradually Appeared Status: Date Acquired: 08/30/2017 Comorbid Cataracts, Anemia, Hypertension, Myocardial Weeks Of Treatment: 2 History: Infarction, Type I Diabetes, Rheumatoid Clustered Wound: No Arthritis Photos Photo Uploaded By: Alric Quan on 10/13/2017 16:39:11 Wound Measurements Length: (cm) 0.1 Width: (cm) 0.1 Depth: (cm) 0.1 Area: (cm) 0.008 Volume: (cm) 0.001 % Reduction in Area: 99.2% % Reduction in Volume: 99% Epithelialization: Large (67-100%) Tunneling: No Wound Description Classification: Category/Stage II Wound  Margin: Flat and Intact Exudate Amount: None Present Foul Odor After Cleansing: No Slough/Fibrino Yes Wound Bed Granulation Amount: None Present (0%) Exposed Structure Necrotic Amount: Small (1-33%) Fascia Exposed: No Necrotic Quality: Eschar Fat Layer (Subcutaneous Tissue) Exposed: No Tendon Exposed: No Muscle Exposed: No Joint Exposed: No Bone Exposed: No Periwound Skin Texture Texture Color No Abnormalities Noted: No No Abnormalities Noted: No SEVERO, BEBER. ([id number]) Callus: Yes Atrophie Blanche: No Crepitus: No Cyanosis: No Excoriation: No Ecchymosis: No Induration: No Erythema: No Rash: No Hemosiderin  Staining: No Scarring: Yes Mottled: No Pallor: No Moisture Rubor: No No Abnormalities Noted: No Dry / Scaly: No Temperature / Pain Maceration: No Temperature: No Abnormality Tenderness on Palpation: Yes Wound Preparation Ulcer Cleansing: Rinsed/Irrigated with Saline Topical Anesthetic Applied: Other: lidocaine 4%, Treatment Notes Wound #3 (Left Toe Third) 1. Cleansed with: Clean wound with Normal Saline 2. Anesthetic Topical Lidocaine 4% cream to wound bed prior to debridement 4. Dressing Applied: Other dressing (specify in notes) 5. Secondary Dressing Applied Dry Gauze Notes kerlix and coban wrap Electronic Signature(s) Signed: 10/15/2017 4:17:47 PM By: Alric Quan Entered By: Alric Quan on 10/13/2017 15:41:58 EL, PILE ([id number]) -------------------------------------------------------------------------------- Wound Assessment Details Patient Name: Kenneth [name] Date of Service: 10/13/2017 3:30 PM Medical Record Number: [id number] Patient Account Number: [id number] Date of Birth/Sex: [birth year] (61 y.o. Male) Treating RN: Carolyne Fiscal, Debi Primary Care Cheskel Silverio: Di Kindle Other Clinician: Referring Zabdiel Dripps: Di Kindle Treating Kadesha Virrueta/Extender: Ricard Dillon Weeks in Treatment: 2 Wound Status Wound Number: 4 Primary Pressure Ulcer Etiology: Wound Location: Left Toe Fourth Wound Open Wounding Event: Gradually Appeared Status: Date Acquired: 08/30/2017 Comorbid Cataracts, Anemia, Hypertension, Myocardial Weeks Of Treatment: 2 History: Infarction, Type I Diabetes, Rheumatoid Clustered Wound: No Arthritis Photos Photo Uploaded By: Alric Quan on 10/13/2017 16:39:31 Wound Measurements Length: (cm) 0.3 Width: (cm) 0.4 Depth: (cm) 0.3 Area: (cm) 0.094 Volume: (cm) 0.028 % Reduction in Area: 93.8% % Reduction in Volume: 81.5% Epithelialization: Large (67-100%) Tunneling: No Undermining: Yes Starting Position (o'clock): 10 Ending  Position (o'clock): 1 Maximum Distance: (cm) 0.3 Wound Description Classification: Category/Stage II Wound Margin: Flat and Intact Exudate Amount: Large Exudate Type: Serosanguineous Exudate Color: red, brown Foul Odor After Cleansing: No Slough/Fibrino Yes Wound Bed Granulation Amount: None Present (0%) Exposed Structure Necrotic Amount: Large (67-100%) Fascia Exposed: No Necrotic Quality: Eschar, Adherent Slough Fat Layer (Subcutaneous Tissue) Exposed: Yes Tendon Exposed: No Muscle Exposed: No Schleich, Terrin W. ([id number]) Joint Exposed: No Bone Exposed: No Periwound Skin Texture Texture Color No Abnormalities Noted: No No Abnormalities Noted: No Callus: Yes Atrophie Blanche: No Crepitus: No Cyanosis: No Excoriation: No Ecchymosis: No Induration: No Erythema: No Rash: No Hemosiderin Staining: No Scarring: Yes Mottled: No Pallor: No Moisture Rubor: No No Abnormalities Noted: No Dry / Scaly: No Maceration: No Wound Preparation Ulcer Cleansing: Rinsed/Irrigated with Saline Topical Anesthetic Applied: Other: lidocaine 4%, Treatment Notes Wound #4 (Left Toe Fourth) 1. Cleansed with: Clean wound with Normal Saline 2. Anesthetic Topical Lidocaine 4% cream to wound bed prior to debridement 4. Dressing Applied: Santyl Ointment 5. Secondary Dressing Applied ABD Pad Dry Gauze Notes kerlix and coban wrap Electronic Signature(s) Signed: 10/15/2017 4:17:47 PM By: Alric Quan Entered By: Alric Quan on 10/13/2017 15:49:54 Nudd, Glee Arvin ([id number]) -------------------------------------------------------------------------------- Wound Assessment Details Patient Name: Kenneth [name] Date of Service: 10/13/2017 3:30 PM Medical Record Number: [id number] Patient Account Number: [id number] Date of Birth/Sex: November 14, 1955 (61 y.o. Male) Treating RN: Carolyne Fiscal, Debi Primary Care Wm Fruchter: Di Kindle Other Clinician: Referring Korrey Schleicher: Di Kindle  Treating  Enjoli Tidd/Extender: Ricard Dillon Weeks in Treatment: 2 Wound Status Wound Number: 5 Primary Pressure Ulcer Etiology: Wound Location: Left Toe Fifth Wound Open Wounding Event: Gradually Appeared Status: Date Acquired: 08/30/2017 Comorbid Cataracts, Anemia, Hypertension, Myocardial Weeks Of Treatment: 2 History: Infarction, Type I Diabetes, Rheumatoid Clustered Wound: No Arthritis Photos Photo Uploaded By: Alric Quan on 10/13/2017 16:39:31 Wound Measurements Length: (cm) 1.5 Width: (cm) 0.9 Depth: (cm) 0.2 Area: (cm) 1.06 Volume: (cm) 0.212 % Reduction in Area: 72.8% % Reduction in Volume: 45.6% Epithelialization: Large (67-100%) Tunneling: No Undermining: Yes Starting Position (o'clock): 9 Ending Position (o'clock): 12 Maximum Distance: (cm) 0.2 Wound Description Classification: Category/Stage II Wound Margin: Flat and Intact Exudate Amount: Large Exudate Type: Serosanguineous Exudate Color: red, brown Foul Odor After Cleansing: No Slough/Fibrino Yes Wound Bed Granulation Amount: Medium (34-66%) Exposed Structure Granulation Quality: Red Fascia Exposed: No Necrotic Amount: Medium (34-66%) Fat Layer (Subcutaneous Tissue) Exposed: Yes Necrotic Quality: Eschar, Adherent Slough Tendon Exposed: No Muscle Exposed: No Palen, Tami W. ([id number]) Joint Exposed: No Bone Exposed: No Periwound Skin Texture Texture Color No Abnormalities Noted: No No Abnormalities Noted: No Callus: Yes Atrophie Blanche: No Crepitus: No Cyanosis: No Excoriation: No Ecchymosis: No Induration: Yes Erythema: No Rash: No Hemosiderin Staining: No Scarring: Yes Mottled: No Pallor: No Moisture Rubor: No No Abnormalities Noted: No Dry / Scaly: No Temperature / Pain Maceration: No Temperature: No Abnormality Wound Preparation Ulcer Cleansing: Rinsed/Irrigated with Saline Topical Anesthetic Applied: Other: lidocaine 4%, Treatment Notes Wound #5 (Left Toe Fifth) 1.  Cleansed with: Clean wound with Normal Saline 2. Anesthetic Topical Lidocaine 4% cream to wound bed prior to debridement 4. Dressing Applied: Other dressing (specify in notes) 5. Secondary Dressing Applied Dry Gauze Notes kerlix and coban wrap Electronic Signature(s) Signed: 10/15/2017 4:17:47 PM By: Alric Quan Entered By: Alric Quan on 10/13/2017 15:44:29 DAULTON, HARBAUGH ([id number]) -------------------------------------------------------------------------------- [place] Details Patient Name: Kenneth [name] Date of Service: 10/13/2017 3:30 PM Medical Record Number: [id number] Patient Account Number: [id number] Date of Birth/Sex: [birth year] (61 y.o. Male) Treating RN: Carolyne Fiscal, Debi Primary Care Antonetta Clanton: Di Kindle Other Clinician: Referring Shontae Rosiles: Di Kindle Treating Raul Winterhalter/Extender: Ricard Dillon Weeks in Treatment: 2 Vital Signs Time Taken: 15:34 Temperature (F): 98.4 Height (in): 67 Pulse (bpm): 70 Weight (lbs): 150 Respiratory Rate (breaths/min): 18 Body Mass Index (BMI): 23.5 Blood Pressure (mmHg): 133/54 Reference Range: 80 - 120 mg / dl Electronic Signature(s) Signed: 10/15/2017 4:17:47 PM By: Alric Quan Entered By: Alric Quan on 10/13/2017 15:34:51

## 2017-10-17 NOTE — Progress Notes (Addendum)
JAYSTEN, ESSNER ([id number]) Visit Report for 10/13/2017 Debridement Details Patient Name: Kenneth [name], Kenneth [name] Date of Service: 10/13/2017 3:30 PM Medical Record Number: [id number] Patient Account Number: [id number] Date of Birth/Sex: [birth year] (62 y.o. Male) Treating RN: Carolyne Fiscal, Debi Primary Care Provider: Di Kindle Other Clinician: Referring Provider: Di Kindle Treating Provider/Extender: Ricard Dillon Weeks in Treatment: 2 Debridement Performed for Wound #1 Left Toe Great Assessment: Performed By: Physician Ricard Dillon, MD Debridement: Debridement Pre-procedure Verification/Time Yes - 15:49 Out Taken: Start Time: 15:50 Pain Control: Lidocaine 4% Topical Solution Level: Skin/Subcutaneous Tissue Total Area Debrided (L x W): 1.5 (cm) x 1.3 (cm) = 1.95 (cm) Tissue and other material Viable, Non-Viable, Exudate, Fibrin/Slough, Subcutaneous debrided: Instrument: Curette Bleeding: Minimum Hemostasis Achieved: Pressure End Time: 15:51 Procedural Pain: 0 Post Procedural Pain: 0 Response to Treatment: Procedure was tolerated well Post Debridement Measurements of Total Wound Length: (cm) 1.5 Stage: Category/Stage II Width: (cm) 1.3 Depth: (cm) 0.3 Volume: (cm) 0.459 Character of Wound/Ulcer Post Requires Further Debridement Debridement: Post Procedure Diagnosis Same as Pre-procedure Electronic Signature(s) Signed: 10/13/2017 4:49:25 PM By: Linton Ham MD Signed: 10/15/2017 4:17:47 PM By: Alric Quan Entered By: Linton Ham on 10/13/2017 16:41:58 Tredway, Glee Arvin ([id number]) -------------------------------------------------------------------------------- Debridement Details Patient Name: Kenneth [name] Date of Service: 10/13/2017 3:30 PM Medical Record Number: [id number] Patient Account Number: [id number] Date of Birth/Sex: 04/21/1956 (62 y.o. Male) Treating RN: Carolyne Fiscal, Debi Primary Care Provider: Di Kindle Other Clinician: Referring Provider: Di Kindle Treating Provider/Extender: Tito Dine in Treatment: 2 Debridement Performed for Wound #5 Left Toe Fifth Assessment: Performed By: Physician Ricard Dillon, MD Debridement: Debridement Pre-procedure Verification/Time Yes - 15:49 Out Taken: Start Time: 15:51 Pain Control: Lidocaine 4% Topical Solution Level: Skin/Subcutaneous Tissue Total Area Debrided (L x W): 1.5 (cm) x 0.9 (cm) = 1.35 (cm) Tissue and other material Viable, Non-Viable, Exudate, Fibrin/Slough, Subcutaneous debrided: Instrument: Curette Bleeding: Minimum Hemostasis Achieved: Pressure End Time: 15:52 Procedural Pain: 0 Post Procedural Pain: 0 Response to Treatment: Procedure was tolerated well Post Debridement Measurements of Total Wound Length: (cm) 1.5 Stage: Category/Stage II Width: (cm) 0.9 Depth: (cm) 0.3 Volume: (cm) 0.318 Character of Wound/Ulcer Post Requires Further Debridement Debridement: Post Procedure Diagnosis Same as Pre-procedure Electronic Signature(s) Signed: 10/13/2017 4:49:25 PM By: Linton Ham MD Signed: 10/15/2017 4:17:47 PM By: Alric Quan Entered By: Linton Ham on 10/13/2017 16:42:19 Millette, Glee Arvin ([id number]) -------------------------------------------------------------------------------- Debridement Details Patient Name: Kenneth [name] Date of Service: 10/13/2017 3:30 PM Medical Record Number: [id number] Patient Account Number: [id number] Date of Birth/Sex: 11-01-1955 (62 y.o. Male) Treating RN: Carolyne Fiscal, Debi Primary Care Provider: Di Kindle Other Clinician: Referring Provider: Di Kindle Treating Provider/Extender: Ricard Dillon Weeks in Treatment: 2 Debridement Performed for Wound #4 Left Toe Fourth Assessment: Performed By: Physician Ricard Dillon, MD Debridement: Debridement Pre-procedure Verification/Time Yes - 15:49 Out Taken: Start Time: 15:52 Pain Control: Lidocaine 4% Topical Solution Level: Skin/Subcutaneous  Tissue/Muscle Total Area Debrided (L x W): 0.3 (cm) x 0.4 (cm) = 0.12 (cm) Tissue and other material Viable, Non-Viable, Exudate, Fibrin/Slough, Muscle, Subcutaneous debrided: Instrument: Curette Specimen: Swab Number of Specimens Taken: 1 Bleeding: Minimum Hemostasis Achieved: Pressure End Time: 15:54 Procedural Pain: 0 Post Procedural Pain: 0 Response to Treatment: Procedure was tolerated well Post Debridement Measurements of Total Wound Length: (cm) 0.3 Stage: Category/Stage II Width: (cm) 0.4 Depth: (cm) 0.4 Volume: (cm) 0.038 Character of Wound/Ulcer Post Requires Further Debridement Debridement: Post Procedure Diagnosis Same as Pre-procedure Electronic Signature(s) Signed: 10/13/2017 4:49:25 PM By: Dellia Nims,  Legrand Como MD Signed: 10/15/2017 4:17:47 PM By: Alric Quan Entered By: Linton Ham on 10/13/2017 16:44:12 Kenneth [name] ([id number]) -------------------------------------------------------------------------------- HPI Details Patient Name: Kenneth [name] Date of Service: 10/13/2017 3:30 PM Medical Record Number: [id number] Patient Account Number: [id number] Date of Birth/Sex: Jun 29, 1956 (62 y.o. Male) Treating RN: Carolyne Fiscal, Debi Primary Care Provider: Di Kindle Other Clinician: Referring Provider: Di Kindle Treating Provider/Extender: Ricard Dillon Weeks in Treatment: 2 History of Present Illness HPI Description: Lady Gary 02/25/17; this is a 62 year old man with type 2 diabetes who has an insulin pump. He has had problems for the last 2 months with wounds on the dorsal aspect of his right second and right third toe. This presented as blisters. He was admitted to hospital from 5/7 through 5/10 with cellulitis in the foot. Cultures at that time grew methicillin sensitive staph aureus. He was treated with Vanco and Zosyn and discharged on Augmentin. He was recorded as having a black necrotic eschar. Arterial studies done showed biphasic and triphasic  waveforms. On the right posterior tibial ABI of 0.96 on the left at 0.99. Total brachial index on the right was 0.62 and on the left at 0.76. It was felt there was a mild decrease in right-sided ABI when compared to a prior study. I'm not completely certain what he has been using on these wounds recently although he does have santyl. 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 He has been followed by his podiatrist. He had an MRI of the foot and 01/21/17 which showed subcutaneous edema over the dorsum of the foot compatible with cellulitis. There was no abscess convincing evidence of osteomyelitis and negative for septic arthritis. The patient is here for review of nonhealing wounds as described. He is not working. 03/04/17; patient arrives today with still an adherent necrotic material over the surface of both wounds. He has continued to use Santyl and I think we'll need to continue to use Santyl. He is had a reasonably complete workup including arterial studies and an MRI. This initially presented as an infection in the toes was cellulitis and a necrotic surface. 03/11/17; still requires ongoing debridement. He received Santyl but he just got it yesterday but he's been using some supply that we gave to him. The area on the third toe appears to be making good progress. The second toe perhaps slightly improved. Both areas still have a tightly adherent necrotic surface that does not wash off. Still requires ongoing debridement 03/18/17; both wound surface is look better he is been using Santyl. 03/25/17 right third toe was just about closed perhaps a very tiny open area inferiorly. The second toe is still open however the base of this looks much better. I think we can switch to silver collagen to the right second toe in place of Santyl 04/01/17; the patient arrived today with a scissor induced injury on the lateral aspect of the same right second toe. He was cutting off the dressing in  order to take a shower this morning. Arrived in clinic today with a bleeding area. Her was no laceration per se and nothing that could be sutured a whole surface layer of tissue was taken off the lateral aspect of the right second toe. In the meantime the right third toe is healed. Dorsal right second toe improved but still not closed. We've been using silver collagen 04/08/17; the patient's wound on the dorsal right third toe is healed still some surface eschar that I did not disturb today.  His original wound on the dorsal aspect of the right second toe continues to look a lot better HOWEVER the scissor injury on the lateral aspect of the tip of his right great toe is a lot worse. Necrotic surface. He does not complain of pain. However he did not dress this all week. 04/20/2017 -- because of his poor eyesight the patient is cutting his surrounding toes when he uses a sharp scissors. I have forbidden him from doing that. He has another wound on the right third toe. 05/06/17; the wounds on his dorsal second and third toes appear closed. He has however an open wound on the medial aspect of the third toe at the level of the PIP joint. This apparently started as a blister and no doubt was due to friction 05/20/17; still has a wound on the medial aspect of his third toe just below the PIP. This does not look infected does not probe the bone but is not made much progress since last time. He also arrives today with a swelling on the lateral aspect of the right first toe compatible with a paronychia in this location 06/03/17; area on the 3rd toe is epithelizalized. and healed ADMISSION 09/29/17 this is a now 62 year old man who I cared for last summer in our sister clinic in [place]. At that point the predominant wounds were on the right second and third toes I think he had a scissor injury on the right first toe. He is here TADD, HOLTMEYER. ([id number]) actually for review of wounds on his left foot. This  apparently was noted by the patient while he was in the shower about a month ago over the plantar aspect of all 5 toes. He was seen by podiatry on 09/16/17 where there was an x-ray of the foot done I think he was given Silvadene cream. I am not sure that they actually followed him up. The patient is not really clear how this happened. He does walk frequently. He denies any excessive heat in the shower that could've been sufficient to cause wounds on the plantar aspect of all his 5 toes. He has known diabetic neuropathy. He does not have any history of diabetic PAD. Arterial studies done in May 2018 showed an ABI on the right of 0.96 on the left of 0.99. TBI in the right at 0.62 and on the left 0.76. As noted I think he is been using Silvadene cream 10/06/17; patient's x-ray of the foot was negative for osteomyelitis. We have been using Santyl to the wounds. The major issues are on the plantar left first, fourth and fifth toes. The second and third toes look as though they're progressing towards closure. He is not on antibiotics nothing really looks infected here. He does not have significant PAD, the exact etiology of this is really unclear presumably excessive pressure/friction in footwear. 10/13/17; we have been using Santyl to the wounds. The major issues continue to be the first and fourth toes. The fifth toe looks better second and third toes continued to improve he does not have significant PAD. X-ray of the foot was negative for osteomyelitis. The left plantar fourth toe is really concerning as during his increasing depth necrotic tissue. I have done a culture of this today but no empiric antibiotics Electronic Signature(s) Signed: 10/13/2017 4:49:25 PM By: Linton Ham MD Entered By: Linton Ham on 10/13/2017 16:43:35 Martinez, Glee Arvin ([id number]) -------------------------------------------------------------------------------- Physical Exam Details Patient Name: Kenneth [name] Date of  Service: 10/13/2017 3:30 PM Medical  Record Number: [id number] Patient Account Number: [id number] Date of Birth/Sex: Jun 16, 1956 (62 y.o. Male) Treating RN: Carolyne Fiscal, Debi Primary Care Provider: Di Kindle Other Clinician: Referring Provider: Di Kindle Treating Provider/Extender: Ricard Dillon Weeks in Treatment: 2 Notes Wound exam; the area in question is on the plantar aspect of all 5 toes on the left foot oThe second and third toes look as though they're progressing towards closure oThe left first toe plantar aspect actually looks better after an aggressive debridement removing necrotic surface debris and subcutaneous tissue oDebridement of left fifth toe also reviewed removing necrotic surface and subcutaneous tissue also looks better oThe most concerning issue here is the left plantar fourth toe which is almost down to bone. There is necrotic material at the base of this which is probably mostly muscle removed with a #3 curet. Because of the deterioration here I've done a swab culture but no empiric antibiotics Electronic Signature(s) Signed: 10/13/2017 4:49:25 PM By: Linton Ham MD Entered By: Linton Ham on 10/13/2017 16:45:55 Amparo, Glee Arvin ([id number]) -------------------------------------------------------------------------------- Physician Orders Details Patient Name: Kenneth [name] Date of Service: 10/13/2017 3:30 PM Medical Record Number: [id number] Patient Account Number: [id number] Date of Birth/Sex: [birth year] (61 y.o. Male) Treating RN: Carolyne Fiscal, Debi Primary Care Provider: Di Kindle Other Clinician: Referring Provider: Di Kindle Treating Provider/Extender: Tito Dine in Treatment: 2 Verbal / Phone Orders: Yes Clinician: Pinkerton, Debi Read Back and Verified: Yes Diagnosis Coding Wound Cleansing Wound #1 Left Toe Great o Clean wound with Normal Saline. Wound #2 Left Toe Second o Clean wound with Normal Saline. Wound #3 Left Toe Third o  Clean wound with Normal Saline. Wound #4 Left Toe Fourth o Clean wound with Normal Saline. Wound #5 Left Toe Fifth o Clean wound with Normal Saline. Anesthetic (add to Medication List) Wound #1 Left Toe Great o Topical Lidocaine 4% cream applied to wound bed prior to debridement (In Clinic Only). Wound #2 Left Toe Second o Topical Lidocaine 4% cream applied to wound bed prior to debridement (In Clinic Only). Wound #3 Left Toe Third o Topical Lidocaine 4% cream applied to wound bed prior to debridement (In Clinic Only). Wound #4 Left Toe Fourth o Topical Lidocaine 4% cream applied to wound bed prior to debridement (In Clinic Only). Wound #5 Left Toe Fifth o Topical Lidocaine 4% cream applied to wound bed prior to debridement (In Clinic Only). Skin Barriers/Peri-Wound Care o Other: - silver nitrate to great toe, fourth and fifth toe to stop bleeding Primary Wound Dressing Wound #1 Left Toe Great o Santyl Ointment o Santyl Ointment Wound #2 Left Toe Second o Silvercel Non-Adherent o Silvercel Non-Adherent Wound #3 Left Toe Third ARISTIDIS, TALERICO. ([id number]) o Silvercel Non-Adherent o Silvercel Non-Adherent Wound #4 Left Toe Fourth o Santyl Ointment Wound #5 Left Toe Fifth o Silvercel Non-Adherent Secondary Dressing Wound #1 Left Toe Great o ABD pad o Gauze and Kerlix/Conform Wound #2 Left Toe Second o ABD pad o Gauze and Kerlix/Conform Wound #3 Left Toe Third o ABD pad o Gauze and Kerlix/Conform Wound #4 Left Toe Fourth o ABD pad o Gauze and Kerlix/Conform Wound #5 Left Toe Fifth o ABD pad o Gauze and Kerlix/Conform Dressing Change Frequency Wound #1 Left Toe Great o Change dressing every day. Wound #2 Left Toe Second o Change dressing every day. Wound #3 Left Toe Third o Change dressing every day. Wound #4 Left Toe Fourth o Change dressing every day. Wound #5 Left Toe Fifth o Change dressing every  day. Follow-up  Appointments Wound #1 Left Toe Great o Return Appointment in 1 week. Wound #2 Left Toe Second o Return Appointment in 1 week. Wound #3 Left Toe Third o Return Appointment in 1 week. MADOC, HOLQUIN. ([id number]) Wound #4 Left Toe Fourth o Return Appointment in 1 week. Wound #5 Left Toe Fifth o Return Appointment in 1 week. Off-Loading Wound #1 Left Toe Great o Open toe surgical shoe to: - darko off loading shoe for frontal offloading Wound #2 Left Toe Second o Open toe surgical shoe to: - darko off loading shoe for frontal offloading Wound #3 Left Toe Third o Open toe surgical shoe to: - darko off loading shoe for frontal offloading Wound #4 Left Toe Fourth o Open toe surgical shoe to: - darko off loading shoe for frontal offloading Wound #5 Left Toe Fifth o Open toe surgical shoe to: - darko off loading shoe for frontal offloading Laboratory o Bacteria identified in Wound by Culture (MICRO) - left foot, fourth toe oooo LOINC Code: 3382-5 oooo Convenience Name: Wound culture routine Patient Medications Allergies: No Known Drug Allergies Notifications Medication Indication Start End Levaquin wound infection DOSE oral 250 mg tablet - 2 tablets oral for 1 dose then 250=1 tablet q48 hours Electronic Signature(s) Signed: 10/18/2017 8:08:10 AM By: Linton Ham MD Previous Signature: 10/13/2017 4:49:25 PM Version By: Linton Ham MD Previous Signature: 10/15/2017 4:17:47 PM Version By: Alric Quan Entered By: Linton Ham on 10/18/2017 08:08:09 Kenneth [name] ([id number]) -------------------------------------------------------------------------------- Problem List Details Patient Name: Kenneth [name] Date of Service: 10/13/2017 3:30 PM Medical Record Number: [id number] Patient Account Number: [id number] Date of Birth/Sex: [birth year] (61 y.o. Male) Treating RN: Carolyne Fiscal, Debi Primary Care Provider: Di Kindle Other Clinician: Referring  Provider: Di Kindle Treating Provider/Extender: Ricard Dillon Weeks in Treatment: 2 Active Problems ICD-10 Encounter Code Description Active Date Diagnosis E11.621 Type 2 diabetes mellitus with foot ulcer 09/29/2017 Yes L97.528 Non-pressure chronic ulcer of other part of left foot with other 09/29/2017 Yes specified severity E11.40 Type 2 diabetes mellitus with diabetic neuropathy, unspecified 09/29/2017 Yes Inactive Problems Resolved Problems Electronic Signature(s) Signed: 10/13/2017 4:49:25 PM By: Linton Ham MD Entered By: Linton Ham on 10/13/2017 16:41:26 Prabhu, Glee Arvin ([id number]) -------------------------------------------------------------------------------- Progress Note Details Patient Name: Kenneth [name] Date of Service: 10/13/2017 3:30 PM Medical Record Number: [id number] Patient Account Number: [id number] Date of Birth/Sex: [birth year] (61 y.o. Male) Treating RN: Carolyne Fiscal, Debi Primary Care Provider: Di Kindle Other Clinician: Referring Provider: Di Kindle Treating Provider/Extender: Ricard Dillon Weeks in Treatment: 2 Subjective History of Present Illness (HPI) [place] 02/25/17; this is a 62 year old man with type 2 diabetes who has an insulin pump. He has had problems for the last 2 months with wounds on the dorsal aspect of his right second and right third toe. This presented as blisters. He was admitted to hospital from 5/7 through 5/10 with cellulitis in the foot. Cultures at that time grew methicillin sensitive staph aureus. He was treated with Vanco and Zosyn and discharged on Augmentin. He was recorded as having a black necrotic eschar. Arterial studies done showed biphasic and triphasic waveforms. On the right posterior tibial ABI of 0.96 on the left at 0.99. Total brachial index on the right was 0.62 and on the left at 0.76. It was felt there was a mild decrease in right-sided ABI when compared to a prior study. I'm not completely certain what  he has been using on these wounds recently although he does have santyl. It was felt in the  hospital that he had diabetic microangiopathy. He has been followed by his podiatrist. He had an MRI of the foot and 01/21/17 which showed subcutaneous edema over the dorsum of the foot compatible with cellulitis. There was no abscess convincing evidence of osteomyelitis and negative for septic arthritis. The patient is here for review of nonhealing wounds as described. He is not working. 03/04/17; patient arrives today with still an adherent necrotic material over the surface of both wounds. He has continued to use Santyl and I think we'll need to continue to use Santyl. He is had a reasonably complete workup including arterial studies and an MRI. This initially presented as an infection in the toes was cellulitis and a necrotic surface. 03/11/17; still requires ongoing debridement. He received Santyl but he just got it yesterday but he's been using some supply that we gave to him. The area on the third toe appears to be making good progress. The second toe perhaps slightly improved. Both areas still have a tightly adherent necrotic surface that does not wash off. Still requires ongoing debridement 03/18/17; both wound surface is look better he is been using Santyl. 03/25/17 right third toe was just about closed perhaps a very tiny open area inferiorly. The second toe is still open however the base of this looks much better. I think we can switch to silver collagen to the right second toe in place of Santyl 04/01/17; the patient arrived today with a scissor induced injury on the lateral aspect of the same right second toe. He was cutting off the dressing in order to take a shower this morning. Arrived in clinic today with a bleeding area. Her was no laceration per se and nothing that could be sutured a whole surface layer of tissue was taken off the lateral aspect of the right second toe. In the meantime the right  third toe is healed. Dorsal right second toe improved but still not closed. We've been using silver collagen 04/08/17; the patient's wound on the dorsal right third toe is healed still some surface eschar that I did not disturb today. His original wound on the dorsal aspect of the right second toe continues to look a lot better HOWEVER the scissor injury on the lateral aspect of the tip of his right great toe is a lot worse. Necrotic surface. He does not complain of pain. However he did not dress this all week. 04/20/2017 -- because of his poor eyesight the patient is cutting his surrounding toes when he uses a sharp scissors. I have forbidden him from doing that. He has another wound on the right third toe. 05/06/17; the wounds on his dorsal second and third toes appear closed. He has however an open wound on the medial aspect of the third toe at the level of the PIP joint. This apparently started as a blister and no doubt was due to friction 05/20/17; still has a wound on the medial aspect of his third toe just below the PIP. This does not look infected does not probe the bone but is not made much progress since last time. He also arrives today with a swelling on the lateral aspect of the right first toe compatible with a paronychia in this location 06/03/17; area on the 3rd toe is epithelizalized. and healed ADMISSION 09/29/17 this is a now 62 year old man who I cared for last summer in our sister clinic in [place]. At that point the [place]. ([id number]) predominant wounds were on the right second and  third toes I think he had a scissor injury on the right first toe. He is here actually for review of wounds on his left foot. This apparently was noted by the patient while he was in the shower about a month ago over the plantar aspect of all 5 toes. He was seen by podiatry on 09/16/17 where there was an x-ray of the foot done I think he was given Silvadene cream. I am not sure that they actually  followed him up. The patient is not really clear how this happened. He does walk frequently. He denies any excessive heat in the shower that could've been sufficient to cause wounds on the plantar aspect of all his 5 toes. He has known diabetic neuropathy. He does not have any history of diabetic PAD. Arterial studies done in May 2018 showed an ABI on the right of 0.96 on the left of 0.99. TBI in the right at 0.62 and on the left 0.76. As noted I think he is been using Silvadene cream 10/06/17; patient's x-ray of the foot was negative for osteomyelitis. We have been using Santyl to the wounds. The major issues are on the plantar left first, fourth and fifth toes. The second and third toes look as though they're progressing towards closure. He is not on antibiotics nothing really looks infected here. He does not have significant PAD, the exact etiology of this is really unclear presumably excessive pressure/friction in footwear. 10/13/17; we have been using Santyl to the wounds. The major issues continue to be the first and fourth toes. The fifth toe looks better second and third toes continued to improve he does not have significant PAD. X-ray of the foot was negative for osteomyelitis. The left plantar fourth toe is really concerning as during his increasing depth necrotic tissue. I have done a culture of this today but no empiric antibiotics Objective Constitutional Vitals Time Taken: 3:34 PM, Height: 67 in, Weight: 150 lbs, BMI: 23.5, Temperature: 98.4 F, Pulse: 70 bpm, Respiratory Rate: 18 breaths/min, Blood Pressure: 133/54 mmHg. Integumentary (Hair, Skin) Wound #1 status is Open. Original cause of wound was Gradually Appeared. The wound is located on the Left Toe Great. The wound measures 1.5cm length x 1.3cm width x 0.2cm depth; 1.532cm^2 area and 0.306cm^3 volume. There is Fat Layer (Subcutaneous Tissue) Exposed exposed. There is no tunneling noted, however, there is undermining starting  at 12:00 and ending at 12:00 with a maximum distance of 0.2cm. There is a large amount of serosanguineous drainage noted. The wound margin is flat and intact. There is no granulation within the wound bed. There is a large (67-100%) amount of necrotic tissue within the wound bed including Adherent Slough. The periwound skin appearance exhibited: Callus, Scarring, Maceration. The periwound skin appearance did not exhibit: Crepitus, Excoriation, Induration, Rash, Dry/Scaly, Atrophie Blanche, Cyanosis, Ecchymosis, Hemosiderin Staining, Mottled, Pallor, Rubor, Erythema. Periwound temperature was noted as No Abnormality. Wound #2 status is Open. Original cause of wound was Gradually Appeared. The wound is located on the Left Toe Second. The wound measures 0.5cm length x 0.5cm width x 0.1cm depth; 0.196cm^2 area and 0.02cm^3 volume. There is no tunneling or undermining noted. There is a none present amount of drainage noted. The wound margin is flat and intact. There is no granulation within the wound bed. There is a large (67-100%) amount of necrotic tissue within the wound bed including Eschar. The periwound skin appearance exhibited: Callus. The periwound skin appearance did not exhibit: Crepitus, Excoriation, Induration, Rash, Scarring, Dry/Scaly,  Maceration, Atrophie Blanche, Cyanosis, Ecchymosis, Hemosiderin Staining, Mottled, Pallor, Rubor, Erythema. Periwound temperature was noted as No Abnormality. The periwound has tenderness on palpation. Wound #3 status is Open. Original cause of wound was Gradually Appeared. The wound is located on the Left Toe Third. The wound measures 0.1cm length x 0.1cm width x 0.1cm depth; 0.008cm^2 area and 0.001cm^3 volume. There is no tunneling noted. There is a none present amount of drainage noted. The wound margin is flat and intact. There is no granulation within the wound bed. There is a small (1-33%) amount of necrotic tissue within the wound bed including  Eschar. The periwound skin appearance exhibited: Callus, Scarring. The periwound skin appearance did not exhibit: Crepitus, Excoriation, Induration, Taulbee, Daquon W. ([id number]) Rash, Dry/Scaly, Maceration, Atrophie Blanche, Cyanosis, Ecchymosis, Hemosiderin Staining, Mottled, Pallor, Rubor, Erythema. Periwound temperature was noted as No Abnormality. The periwound has tenderness on palpation. Wound #4 status is Open. Original cause of wound was Gradually Appeared. The wound is located on the Left Toe Fourth. The wound measures 0.3cm length x 0.4cm width x 0.3cm depth; 0.094cm^2 area and 0.028cm^3 volume. There is Fat Layer (Subcutaneous Tissue) Exposed exposed. There is no tunneling noted, however, there is undermining starting at 10:00 and ending at 1:00 with a maximum distance of 0.3cm. There is a large amount of serosanguineous drainage noted. The wound margin is flat and intact. There is no granulation within the wound bed. There is a large (67-100%) amount of necrotic tissue within the wound bed including Eschar and Adherent Slough. The periwound skin appearance exhibited: Callus, Scarring. The periwound skin appearance did not exhibit: Crepitus, Excoriation, Induration, Rash, Dry/Scaly, Maceration, Atrophie Blanche, Cyanosis, Ecchymosis, Hemosiderin Staining, Mottled, Pallor, Rubor, Erythema. Wound #5 status is Open. Original cause of wound was Gradually Appeared. The wound is located on the Left Toe Fifth. The wound measures 1.5cm length x 0.9cm width x 0.2cm depth; 1.06cm^2 area and 0.212cm^3 volume. There is Fat Layer (Subcutaneous Tissue) Exposed exposed. There is no tunneling noted, however, there is undermining starting at 9:00 and ending at 12:00 with a maximum distance of 0.2cm. There is a large amount of serosanguineous drainage noted. The wound margin is flat and intact. There is medium (34-66%) red granulation within the wound bed. There is a medium (34-66%) amount of necrotic tissue  within the wound bed including Eschar and Adherent Slough. The periwound skin appearance exhibited: Callus, Induration, Scarring. The periwound skin appearance did not exhibit: Crepitus, Excoriation, Rash, Dry/Scaly, Maceration, Atrophie Blanche, Cyanosis, Ecchymosis, Hemosiderin Staining, Mottled, Pallor, Rubor, Erythema. Periwound temperature was noted as No Abnormality. 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 There was a Skin/Subcutaneous Tissue Debridement ([id number]) debridement with total area of 1.95 sq cm performed by Ricard Dillon, MD. with the following instrument(s): Curette to remove Viable and Non-Viable tissue/material including Exudate, Fibrin/Slough, and Subcutaneous after achieving pain control using Lidocaine 4% Topical Solution. A time out was conducted at 15:49, prior to the start of the procedure. A Minimum amount of bleeding was controlled with Pressure. The procedure was tolerated well with a pain level of 0 throughout and a pain level of 0 following the procedure. Post Debridement Measurements: 1.5cm length x 1.3cm width x 0.3cm depth; 0.459cm^3  volume. Post debridement Stage noted as Category/Stage II. Character of Wound/Ulcer Post Debridement requires further debridement. Post procedure Diagnosis Wound #1: Same as Pre-Procedure Wound #4 Pre-procedure diagnosis of Wound #4 is a Pressure Ulcer located on the Left Toe Fourth . There was a Skin/Subcutaneous Tissue/Muscle Debridement ([id number]) debridement with total area of 0.12 sq cm performed by Ricard Dillon, MD. with the following instrument(s): Curette to remove Viable and Non-Viable  tissue/material including Exudate, Fibrin/Slough, Muscle, and Subcutaneous after achieving pain control using Lidocaine 4% Topical Solution. 1 Specimen was AARON, BOSTWICK. ([id number]) taken by a Swab and sent to the lab per facility protocol.A time out was conducted at 15:49, prior to the start of the procedure. A Minimum amount of bleeding was controlled with Pressure. The procedure was tolerated well with a pain level of 0 throughout and a pain level of 0 following the procedure. Post Debridement Measurements: 0.3cm length x 0.4cm width x 0.4cm depth; 0.038cm^3 volume. Post debridement Stage noted as Category/Stage II. Character of Wound/Ulcer Post Debridement requires further debridement. Post procedure Diagnosis Wound #4: Same as Pre-Procedure Wound #5 Pre-procedure diagnosis of Wound #5 is a Pressure Ulcer located on the Left Toe Fifth . There was a Skin/Subcutaneous Tissue Debridement ([id number]) debridement with total area of 1.35 sq cm performed by Ricard Dillon, MD. with the following instrument(s): Curette to remove Viable and Non-Viable tissue/material including Exudate, Fibrin/Slough, and Subcutaneous after achieving pain control using Lidocaine 4% Topical Solution. A time out was conducted at 15:49, prior to the start of the procedure. A Minimum amount of bleeding was controlled with Pressure. The procedure was tolerated well with a pain level of 0 throughout and a pain level of 0 following the procedure. Post Debridement Measurements: 1.5cm length x 0.9cm width x 0.3cm depth; 0.318cm^3 volume. Post debridement Stage noted as Category/Stage II. Character of Wound/Ulcer Post Debridement requires further debridement. Post procedure Diagnosis Wound #5: Same as Pre-Procedure Plan Wound Cleansing: Wound #1 Left Toe Great: Clean wound with Normal Saline. Wound #2 Left Toe Second: Clean wound with Normal Saline. Wound #3 Left Toe Third: Clean wound with Normal Saline. Wound #4  Left Toe Fourth: Clean wound with Normal Saline. Wound #5 Left Toe Fifth: Clean wound with Normal Saline. Anesthetic (add to Medication List): Wound #1 Left Toe Great: Topical Lidocaine 4% cream applied to wound bed prior to debridement (In Clinic Only). Wound #2 Left Toe Second: Topical Lidocaine 4% cream applied to wound bed prior to debridement (In Clinic Only). Wound #3 Left Toe Third: Topical Lidocaine 4% cream applied to wound bed prior to debridement (In Clinic Only). Wound #4 Left Toe Fourth: Topical Lidocaine 4% cream applied to wound bed prior to debridement (In Clinic Only). Wound #5 Left Toe Fifth: Topical Lidocaine 4% cream applied to wound bed prior to debridement (In Clinic Only). Skin Barriers/Peri-Wound Care: Other: - silver nitrate to great toe, fourth and fifth toe to stop bleeding Primary Wound Dressing: Wound #1 Left Toe Great: Santyl Ointment Santyl Ointment Wound #2 Left Toe Second: Silvercel Non-Adherent Silvercel Non-Adherent Wound #3 Left Toe Third: Silvercel Non-Adherent ELZIA, HOTT. ([id number]) Silvercel Non-Adherent Wound #4 Left Toe Fourth: Santyl Ointment Wound #5 Left Toe Fifth: Silvercel Non-Adherent Secondary Dressing: Wound #1 Left Toe Great: ABD pad Gauze and Kerlix/Conform Wound #2 Left Toe Second: ABD pad Gauze and Kerlix/Conform Wound #3 Left Toe Third: ABD pad Gauze and Kerlix/Conform Wound #4 Left Toe Fourth: ABD pad Gauze and Kerlix/Conform Wound #5 Left Toe Fifth: ABD pad  Gauze and Kerlix/Conform Dressing Change Frequency: Wound #1 Left Toe Great: Change dressing every day. Wound #2 Left Toe Second: Change dressing every day. Wound #3 Left Toe Third: Change dressing every day. Wound #4 Left Toe Fourth: Change dressing every day. Wound #5 Left Toe Fifth: Change dressing every day. Follow-up Appointments: Wound #1 Left Toe Great: Return Appointment in 1 week. Wound #2 Left Toe Second: Return Appointment in 1  week. Wound #3 Left Toe Third: Return Appointment in 1 week. Wound #4 Left Toe Fourth: Return Appointment in 1 week. Wound #5 Left Toe Fifth: Return Appointment in 1 week. Off-Loading: Wound #1 Left Toe Great: Open toe surgical shoe to: - darko off loading shoe for frontal offloading Wound #2 Left Toe Second: Open toe surgical shoe to: - darko off loading shoe for frontal offloading Wound #3 Left Toe Third: Open toe surgical shoe to: - darko off loading shoe for frontal offloading Wound #4 Left Toe Fourth: Open toe surgical shoe to: - darko off loading shoe for frontal offloading Wound #5 Left Toe Fifth: Open toe surgical shoe to: - darko off loading shoe for frontal offloading Laboratory ordered were: Wound culture routine - left foot, fourth toe The following medication(s) was prescribed: Levaquin oral 250 mg tablet 2 tablets oral for 1 dose then 250=1 tablet q48 hours for wound infection Gallaher, Fidencio W. ([id number]) o o #1 we are continuing with Santyl to the left first and fourth toes #2 silver alginate to the rest #3 await culture result of the fourth toe. Empiric antibiotics were not given. I may need to re-x-ray this foot specifically the fourth toe. Electronic Signature(s) Signed: 11/02/2017 8:58:57 AM By: Linton Ham MD Signed: 11/02/2017 10:42:09 AM By: Worthy Keeler PA-C Previous Signature: 10/13/2017 4:49:25 PM Version By: Linton Ham MD Entered By: Worthy Keeler on 11/02/2017 08:18:48 AEDYN, KEMPFER ([id number]) -------------------------------------------------------------------------------- SuperBill Details Patient Name: Kenneth [name] Date of Service: 10/13/2017 Medical Record Number: [id number] Patient Account Number: [id number] Date of Birth/Sex: [birth year] (61 y.o. Male) Treating RN: Carolyne Fiscal, Debi Primary Care Provider: Di Kindle Other Clinician: Referring Provider: Di Kindle Treating Provider/Extender: Ricard Dillon Weeks in Treatment:  2 Diagnosis Coding ICD-10 Codes Code Description E11.621 Type 2 diabetes mellitus with foot ulcer L97.528 Non-pressure chronic ulcer of other part of left foot with other specified severity E11.40 Type 2 diabetes mellitus with diabetic neuropathy, unspecified Facility Procedures CPT4 Code Description: [id number] 11042 - DEB SUBQ TISSUE 20 SQ CM/< ICD-10 Diagnosis Description L97.528 Non-pressure chronic ulcer of other part of left foot with other Modifier: 25 specified sever Quantity: 1 ity CPT4 Code Description: [id number] 11043 - DEB MUSC/FASCIA 20 SQ CM/< ICD-10 Diagnosis Description L97.528 Non-pressure chronic ulcer of other part of left foot with other Modifier: specified sever Quantity: 1 ity Physician Procedures CPT4 Code Description: 4431540 08676 - WC PHYS SUBQ TISS 20 SQ CM ICD-10 Diagnosis Description L97.528 Non-pressure chronic ulcer of other part of left foot with other s Modifier: 25 pecified severi Quantity: 1 ty CPT4 Code Description: 1950932 67124 - WC PHYS DEBR MUSCLE/FASCIA 20 SQ CM ICD-10 Diagnosis Description L97.528 Non-pressure chronic ulcer of other part of left foot with other s Modifier: pecified severi Quantity: 1 ty Electronic Signature(s) Signed: 10/13/2017 4:49:25 PM By: Linton Ham MD Entered By: Linton Ham on 10/13/2017 16:48:13

## 2017-10-18 ENCOUNTER — Encounter (HOSPITAL_COMMUNITY): Payer: Medicare Other

## 2017-10-20 ENCOUNTER — Encounter: Payer: Medicare Other | Admitting: Internal Medicine

## 2017-10-20 ENCOUNTER — Encounter (HOSPITAL_COMMUNITY): Payer: Medicare Other

## 2017-10-21 ENCOUNTER — Encounter (HOSPITAL_BASED_OUTPATIENT_CLINIC_OR_DEPARTMENT_OTHER): Payer: Medicare Other | Attending: Internal Medicine

## 2017-10-21 DIAGNOSIS — E1122 Type 2 diabetes mellitus with diabetic chronic kidney disease: Secondary | ICD-10-CM | POA: Insufficient documentation

## 2017-10-21 DIAGNOSIS — I509 Heart failure, unspecified: Secondary | ICD-10-CM | POA: Diagnosis not present

## 2017-10-21 DIAGNOSIS — E1140 Type 2 diabetes mellitus with diabetic neuropathy, unspecified: Secondary | ICD-10-CM | POA: Diagnosis not present

## 2017-10-21 DIAGNOSIS — N186 End stage renal disease: Secondary | ICD-10-CM | POA: Insufficient documentation

## 2017-10-21 DIAGNOSIS — I2510 Atherosclerotic heart disease of native coronary artery without angina pectoris: Secondary | ICD-10-CM | POA: Insufficient documentation

## 2017-10-21 DIAGNOSIS — I252 Old myocardial infarction: Secondary | ICD-10-CM | POA: Insufficient documentation

## 2017-10-21 DIAGNOSIS — L97528 Non-pressure chronic ulcer of other part of left foot with other specified severity: Secondary | ICD-10-CM | POA: Insufficient documentation

## 2017-10-21 DIAGNOSIS — B9562 Methicillin resistant Staphylococcus aureus infection as the cause of diseases classified elsewhere: Secondary | ICD-10-CM | POA: Diagnosis not present

## 2017-10-21 DIAGNOSIS — I132 Hypertensive heart and chronic kidney disease with heart failure and with stage 5 chronic kidney disease, or end stage renal disease: Secondary | ICD-10-CM | POA: Insufficient documentation

## 2017-10-21 DIAGNOSIS — E11621 Type 2 diabetes mellitus with foot ulcer: Secondary | ICD-10-CM | POA: Insufficient documentation

## 2017-10-22 ENCOUNTER — Encounter (HOSPITAL_COMMUNITY): Payer: Medicare Other

## 2017-10-25 ENCOUNTER — Encounter (HOSPITAL_COMMUNITY): Payer: Medicare Other

## 2017-10-27 ENCOUNTER — Encounter (HOSPITAL_COMMUNITY): Payer: Medicare Other

## 2017-10-27 ENCOUNTER — Encounter: Payer: Medicare Other | Admitting: Internal Medicine

## 2017-10-28 DIAGNOSIS — E11621 Type 2 diabetes mellitus with foot ulcer: Secondary | ICD-10-CM | POA: Diagnosis not present

## 2017-10-29 ENCOUNTER — Encounter (HOSPITAL_COMMUNITY): Payer: Medicare Other

## 2017-11-01 ENCOUNTER — Encounter (HOSPITAL_COMMUNITY): Payer: Medicare Other

## 2017-11-03 ENCOUNTER — Encounter: Payer: Medicare Other | Admitting: Internal Medicine

## 2017-11-03 ENCOUNTER — Encounter (HOSPITAL_COMMUNITY): Payer: Medicare Other

## 2017-11-04 DIAGNOSIS — E11621 Type 2 diabetes mellitus with foot ulcer: Secondary | ICD-10-CM | POA: Diagnosis not present

## 2017-11-05 ENCOUNTER — Encounter (HOSPITAL_COMMUNITY): Payer: Medicare Other

## 2017-11-08 ENCOUNTER — Encounter (HOSPITAL_COMMUNITY): Payer: Medicare Other

## 2017-11-10 ENCOUNTER — Encounter: Payer: Medicare Other | Admitting: Internal Medicine

## 2017-11-10 ENCOUNTER — Encounter (HOSPITAL_COMMUNITY): Payer: Medicare Other

## 2017-11-11 DIAGNOSIS — E11621 Type 2 diabetes mellitus with foot ulcer: Secondary | ICD-10-CM | POA: Diagnosis not present

## 2017-11-12 ENCOUNTER — Encounter (HOSPITAL_COMMUNITY): Payer: Medicare Other

## 2017-11-15 ENCOUNTER — Encounter (HOSPITAL_COMMUNITY): Payer: Medicare Other

## 2017-11-17 ENCOUNTER — Encounter (HOSPITAL_COMMUNITY): Payer: Medicare Other

## 2017-11-18 ENCOUNTER — Encounter (HOSPITAL_BASED_OUTPATIENT_CLINIC_OR_DEPARTMENT_OTHER): Payer: Medicare Other | Attending: Internal Medicine

## 2017-11-18 DIAGNOSIS — E11621 Type 2 diabetes mellitus with foot ulcer: Secondary | ICD-10-CM | POA: Insufficient documentation

## 2017-11-18 DIAGNOSIS — I120 Hypertensive chronic kidney disease with stage 5 chronic kidney disease or end stage renal disease: Secondary | ICD-10-CM | POA: Insufficient documentation

## 2017-11-18 DIAGNOSIS — L97522 Non-pressure chronic ulcer of other part of left foot with fat layer exposed: Secondary | ICD-10-CM | POA: Diagnosis not present

## 2017-11-18 DIAGNOSIS — I252 Old myocardial infarction: Secondary | ICD-10-CM | POA: Insufficient documentation

## 2017-11-18 DIAGNOSIS — L97529 Non-pressure chronic ulcer of other part of left foot with unspecified severity: Secondary | ICD-10-CM | POA: Diagnosis not present

## 2017-11-18 DIAGNOSIS — I110 Hypertensive heart disease with heart failure: Secondary | ICD-10-CM | POA: Diagnosis not present

## 2017-11-18 DIAGNOSIS — E1122 Type 2 diabetes mellitus with diabetic chronic kidney disease: Secondary | ICD-10-CM | POA: Insufficient documentation

## 2017-11-18 DIAGNOSIS — N186 End stage renal disease: Secondary | ICD-10-CM | POA: Insufficient documentation

## 2017-11-18 DIAGNOSIS — I2510 Atherosclerotic heart disease of native coronary artery without angina pectoris: Secondary | ICD-10-CM | POA: Diagnosis not present

## 2017-11-18 DIAGNOSIS — I509 Heart failure, unspecified: Secondary | ICD-10-CM | POA: Diagnosis not present

## 2017-11-18 DIAGNOSIS — E1140 Type 2 diabetes mellitus with diabetic neuropathy, unspecified: Secondary | ICD-10-CM | POA: Insufficient documentation

## 2017-11-19 ENCOUNTER — Encounter (HOSPITAL_COMMUNITY): Payer: Medicare Other

## 2017-11-22 ENCOUNTER — Encounter (HOSPITAL_COMMUNITY): Payer: Medicare Other

## 2017-11-23 ENCOUNTER — Telehealth (HOSPITAL_COMMUNITY): Payer: Self-pay | Admitting: *Deleted

## 2017-11-23 NOTE — Telephone Encounter (Signed)
Interpreter called for patient stating he has been having on/off chest tightness and discomfort.  These symptoms have become worse since last week.  I advised patient to go to the ER.  No further questions.

## 2017-11-24 ENCOUNTER — Encounter (HOSPITAL_COMMUNITY): Payer: Medicare Other

## 2017-11-25 DIAGNOSIS — E11621 Type 2 diabetes mellitus with foot ulcer: Secondary | ICD-10-CM | POA: Diagnosis not present

## 2017-11-26 ENCOUNTER — Encounter (HOSPITAL_COMMUNITY): Payer: Medicare Other

## 2017-11-29 ENCOUNTER — Encounter (HOSPITAL_COMMUNITY): Payer: Medicare Other

## 2017-12-02 DIAGNOSIS — E11621 Type 2 diabetes mellitus with foot ulcer: Secondary | ICD-10-CM | POA: Diagnosis not present

## 2017-12-04 ENCOUNTER — Encounter (HOSPITAL_BASED_OUTPATIENT_CLINIC_OR_DEPARTMENT_OTHER): Payer: Self-pay | Admitting: Emergency Medicine

## 2017-12-04 ENCOUNTER — Emergency Department (HOSPITAL_BASED_OUTPATIENT_CLINIC_OR_DEPARTMENT_OTHER)
Admission: EM | Admit: 2017-12-04 | Discharge: 2017-12-04 | Disposition: A | Discharge status: Home / Self Care | Payer: Medicare Other | Attending: Emergency Medicine | Admitting: Emergency Medicine

## 2017-12-04 ENCOUNTER — Other Ambulatory Visit: Payer: Self-pay

## 2017-12-04 ENCOUNTER — Emergency Department (HOSPITAL_BASED_OUTPATIENT_CLINIC_OR_DEPARTMENT_OTHER): Payer: Medicare Other

## 2017-12-04 DIAGNOSIS — Z79899 Other long term (current) drug therapy: Secondary | ICD-10-CM | POA: Insufficient documentation

## 2017-12-04 DIAGNOSIS — I5032 Chronic diastolic (congestive) heart failure: Secondary | ICD-10-CM | POA: Insufficient documentation

## 2017-12-04 DIAGNOSIS — R053 Chronic cough: Secondary | ICD-10-CM

## 2017-12-04 DIAGNOSIS — N186 End stage renal disease: Secondary | ICD-10-CM | POA: Diagnosis not present

## 2017-12-04 DIAGNOSIS — I2510 Atherosclerotic heart disease of native coronary artery without angina pectoris: Secondary | ICD-10-CM | POA: Insufficient documentation

## 2017-12-04 DIAGNOSIS — R69 Illness, unspecified: Secondary | ICD-10-CM | POA: Insufficient documentation

## 2017-12-04 DIAGNOSIS — I132 Hypertensive heart and chronic kidney disease with heart failure and with stage 5 chronic kidney disease, or end stage renal disease: Secondary | ICD-10-CM | POA: Diagnosis not present

## 2017-12-04 DIAGNOSIS — Z7902 Long term (current) use of antithrombotics/antiplatelets: Secondary | ICD-10-CM | POA: Diagnosis not present

## 2017-12-04 DIAGNOSIS — Z87891 Personal history of nicotine dependence: Secondary | ICD-10-CM | POA: Diagnosis not present

## 2017-12-04 DIAGNOSIS — E1122 Type 2 diabetes mellitus with diabetic chronic kidney disease: Secondary | ICD-10-CM | POA: Insufficient documentation

## 2017-12-04 DIAGNOSIS — R0789 Other chest pain: Secondary | ICD-10-CM | POA: Diagnosis not present

## 2017-12-04 DIAGNOSIS — Z7982 Long term (current) use of aspirin: Secondary | ICD-10-CM | POA: Insufficient documentation

## 2017-12-04 DIAGNOSIS — Z794 Long term (current) use of insulin: Secondary | ICD-10-CM | POA: Insufficient documentation

## 2017-12-04 DIAGNOSIS — Z951 Presence of aortocoronary bypass graft: Secondary | ICD-10-CM | POA: Insufficient documentation

## 2017-12-04 DIAGNOSIS — R05 Cough: Secondary | ICD-10-CM | POA: Diagnosis not present

## 2017-12-04 LAB — CBC WITH DIFFERENTIAL/PLATELET
Basophils Absolute: 0 10*3/uL (ref 0.0–0.1)
Basophils Relative: 1 %
EOS ABS: 0.3 10*3/uL (ref 0.0–0.7)
Eosinophils Relative: 6 %
HEMATOCRIT: 39.4 % (ref 39.0–52.0)
Hemoglobin: 12.9 g/dL — ABNORMAL LOW (ref 13.0–17.0)
LYMPHS ABS: 1.5 10*3/uL (ref 0.7–4.0)
Lymphocytes Relative: 28 %
MCH: 31.9 pg (ref 26.0–34.0)
MCHC: 32.7 g/dL (ref 30.0–36.0)
MCV: 97.5 fL (ref 78.0–100.0)
MONO ABS: 0.6 10*3/uL (ref 0.1–1.0)
MONOS PCT: 11 %
Neutro Abs: 3 10*3/uL (ref 1.7–7.7)
Neutrophils Relative %: 54 %
Platelets: 134 10*3/uL — ABNORMAL LOW (ref 150–400)
RBC: 4.04 MIL/uL — ABNORMAL LOW (ref 4.22–5.81)
RDW: 14.9 % (ref 11.5–15.5)
WBC: 5.5 10*3/uL (ref 4.0–10.5)

## 2017-12-04 LAB — COMPREHENSIVE METABOLIC PANEL
ALK PHOS: 84 U/L (ref 38–126)
ALT: 18 U/L (ref 17–63)
ANION GAP: 13 (ref 5–15)
AST: 26 U/L (ref 15–41)
Albumin: 4 g/dL (ref 3.5–5.0)
BILIRUBIN TOTAL: 0.9 mg/dL (ref 0.3–1.2)
BUN: 21 mg/dL — ABNORMAL HIGH (ref 6–20)
CALCIUM: 9.1 mg/dL (ref 8.9–10.3)
CO2: 28 mmol/L (ref 22–32)
Chloride: 97 mmol/L — ABNORMAL LOW (ref 101–111)
Creatinine, Ser: 5.95 mg/dL — ABNORMAL HIGH (ref 0.61–1.24)
GFR calc non Af Amer: 9 mL/min — ABNORMAL LOW (ref >60)
GFR, EST AFRICAN AMERICAN: 11 mL/min — AB (ref >60)
GLUCOSE: 150 mg/dL — AB (ref 65–99)
POTASSIUM: 5.3 mmol/L — AB (ref 3.5–5.1)
Sodium: 138 mmol/L (ref 135–145)
TOTAL PROTEIN: 7.5 g/dL (ref 6.5–8.1)

## 2017-12-04 LAB — I-STAT CG4 LACTIC ACID, ED: LACTIC ACID, VENOUS: 1.11 mmol/L (ref 0.5–1.9)

## 2017-12-04 LAB — TROPONIN I
TROPONIN I: 0.03 ng/mL — AB (ref <0.03)
TROPONIN I: 0.03 ng/mL — AB (ref <0.03)

## 2017-12-04 MED ORDER — LOSARTAN POTASSIUM 50 MG PO TABS
50.0000 mg | ORAL_TABLET | Freq: Once | ORAL | Status: DC
  Filled 2017-12-04: qty 1

## 2017-12-04 MED ORDER — HYDRALAZINE HCL 25 MG PO TABS
25.0000 mg | ORAL_TABLET | Freq: Once | ORAL | Status: AC
  Administered 2017-12-04: 25 mg via ORAL
  Filled 2017-12-04: qty 1

## 2017-12-04 MED ORDER — LOSARTAN POTASSIUM 100 MG PO TABS: 100.0000 mg | ORAL_TABLET | Freq: Every day | ORAL | 0 refills | Status: DC

## 2017-12-04 MED ORDER — HYDRALAZINE HCL 25 MG PO TABS: 50.0000 mg | ORAL_TABLET | Freq: Three times a day (TID) | ORAL | 0 refills | Status: DC

## 2017-12-04 MED ORDER — LISINOPRIL 10 MG PO TABS
10.0000 mg | ORAL_TABLET | Freq: Once | ORAL | Status: AC
  Administered 2017-12-04: 10 mg via ORAL
  Filled 2017-12-04: qty 1

## 2017-12-04 MED ORDER — ISOSORBIDE MONONITRATE ER 60 MG PO TB24
60.0000 mg | ORAL_TABLET | Freq: Every day | ORAL | Status: DC
  Filled 2017-12-04: qty 1

## 2017-12-04 NOTE — Discharge Instructions (Addendum)
Dose of hydralazine has been increased to 50 mg 3 times a day.  Her dose of losartan (Cozaar) has been increased to 100 mg a day.DO NOT TAKE OLMESARTAN (your most recently prescribed blood pressure medication) this is a very similar medication to Cozaar.  Make your doctor aware of these changes so all of your medications are reviewed and everybody has the same list. Call your family doctor Monday to schedule recheck. Write down your blood pressure measurement 3 times a day, show this to your doctor monday

## 2017-12-04 NOTE — ED Provider Notes (Signed)
[place] EMERGENCY DEPARTMENT Provider Note   CSN: [id number] Arrival date & time: 12/04/17  0607     History   Chief Complaint Chief Complaint  Patient presents with  . Chest Pain    HPI Kenneth [name] is a 62 y.o. male.  HPI Patient reports he has had symptoms for almost a month.  Symptoms include cough which is usually dry.  Patient's wife reports she has recorded fever but cannot specify how high.  Patient denies shortness of breath.  They give a very nonspecific description of chest pain, advising that it is there sometimes and it comes and goes.  Patient's blood pressure is elevated.  Patient reports that at home his blood pressures are often in the 150s and 160s and sometimes will be up in the 220s.  Patient is compliant with his dialysis.  He reports he is compliant with his medications however his Imdur and hydralazine are both empty and his wife reports that they are getting the refills today.  Patient had dialysis yesterday.. Past Medical History:  Diagnosis Date  . Acute pulmonary edema ([place]) 05/31/2016  . Acute respiratory failure with hypoxemia ([place]) 05/31/2016  . CAD (coronary artery disease) 10/30/2016   NSTEMI 9/17 with CABG x 3 (LIMA-LAD, SVG-OM, SVG-RCA).   - NSTEMI 10/18 s/p DES to ostial SVG to  OM  . Diabetic foot ulcer ([place]) 04/11/2017  . 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 ESRD (end stage renal disease) on dialysis [facility])    "MWF; [place]" (01/18/2017)  . Gastroesophageal reflux disease 08/18/2016  . HCAP (healthcare-associated pneumonia)   . Hemodialysis status ([place])   . Hypertensive heart disease 09/12/2017  . Ischemic rest pain of lower extremity ([place]) 02/06/2015  . 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 NSTEMI (non-ST elevated myocardial infarction) ([place])   . Pleural effusion   . 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 Hypertensive heart disease 09/12/2017    . Hyperlipidemia 09/12/2017  . Hyperkalemia 07/20/2017  . Chronic diastolic CHF (congestive heart failure) ([place]) 04/14/2017  . Anemia, chronic renal failure 04/11/2017  . Diabetic foot ulcer ([place]) 04/11/2017  . Cellulitis in diabetic foot ([place]) 01/18/2017  . Chest pain 12/16/2016  . Pressure injury of skin 11/03/2016  . Diabetes mellitus with complication ([place]) 24/58/0998  . ESRD (end stage renal disease) ([place]) 10/30/2016  . CAD (coronary artery disease) 10/30/2016  . Gastroesophageal reflux disease 08/18/2016  . 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 Ischemic rest pain of lower extremity ([place]) 02/06/2015    Past Surgical History:  Procedure Laterality Date  . AV FISTULA PLACEMENT Left 06/22/2016   Procedure: ARTERIOVENOUS (AV) FISTULA CREATION LEFT UPPER ARM;  Surgeon: Rosetta Posner, MD;  Location: [place];  Service: Vascular;  Laterality: Left;  . CARDIAC CATHETERIZATION N/A 05/31/2016   Procedure: Left Heart Cath and Coronary Angiography;  Surgeon: Jettie Booze, MD;  Location: [place] CV LAB;  Service: Cardiovascular;  Laterality: N/A;  . CARDIAC CATHETERIZATION N/A 05/31/2016   Procedure: Right Heart Cath;  Surgeon: Jettie Booze, MD;  Location: [place] CV LAB;  Service: Cardiovascular;  Laterality: N/A;  . CARDIAC CATHETERIZATION N/A 05/31/2016   Procedure: IABP Insertion;  Surgeon: Jettie Booze, MD;  Location: [place] [facility];  Service: Cardiovascular;  Laterality: N/A;  . CORONARY ARTERY BYPASS GRAFT N/A  06/05/2016   Procedure: CORONARY ARTERY BYPASS GRAFTING (CABG) x3 LIMA to LAD -SVG to OM -SVG to RCA;  Surgeon: Ivin Poot, MD;  Location: [place];  Service: Open Heart Surgery;  Laterality: N/A;  . CORONARY STENT INTERVENTION N/A 07/07/2017   Procedure: CORONARY STENT INTERVENTION;  Surgeon: Wellington Hampshire, MD;  Location: [place] CV LAB;  Service:  Cardiovascular;  Laterality: N/A;  . INSERTION OF DIALYSIS CATHETER N/A 06/05/2016   Procedure: INSERTION OF DIALYSIS/trialysis CATHETER;  Surgeon: Ivin Poot, MD;  Location: [place];  Service: Vascular;  Laterality: N/A;  . INSERTION OF DIALYSIS CATHETER Right 06/13/2016   Procedure: INSERTION OF DIALYSIS CATHETER RIGHT INTERNAL JUGULAR;  Surgeon: Conrad Mona, MD;  Location: [place];  Service: Vascular;  Laterality: Right;  . INTRAOPERATIVE TRANSESOPHAGEAL ECHOCARDIOGRAM N/A 06/05/2016   Procedure: INTRAOPERATIVE TRANSESOPHAGEAL ECHOCARDIOGRAM;  Surgeon: Ivin Poot, MD;  Location: [place];  Service: Open Heart Surgery;  Laterality: N/A;  . LEFT HEART CATH AND CORS/GRAFTS ANGIOGRAPHY N/A 11/03/2016   Procedure: Left Heart Cath and Cors/Grafts Angiography;  Surgeon: Troy Sine, MD;  Location: [place] [facility];  Service: Cardiovascular;  Laterality: N/A;  . LEFT HEART CATH AND CORS/GRAFTS ANGIOGRAPHY N/A 07/07/2017   Procedure: LEFT HEART CATH AND CORS/GRAFTS ANGIOGRAPHY;  Surgeon: Larey Dresser, MD;  Location: [place] CV LAB;  Service: Cardiovascular;  Laterality: N/A;        Home Medications    Prior to Admission medications   Medication Sig Start Date End Date Taking? Authorizing Provider  acetaminophen (TYLENOL) 500 MG tablet Take 500 mg by mouth every 6 (six) hours as needed for mild pain.    [provider]  aspirin 81 MG chewable tablet Chew 1 tablet (81 mg total) by mouth daily. 11/06/16   Caren Griffins, MD  atorvastatin (LIPITOR) 80 MG tablet TAKE 1 TABLET BY MOUTH ONCE DAILY 08/10/17   Larey Dresser, MD  calcium acetate (PHOSLO) 667 MG capsule Take 667 mg by mouth 3 (three) times daily with meals.  07/09/17   [provider]  citalopram (CELEXA) 20 MG tablet Take 20 mg by mouth daily. 02/25/17   [provider]  clopidogrel (PLAVIX) 75 MG tablet Take 1 tablet (75 mg total) by mouth daily. 01/12/17   Bensimhon, Shaune Pascal, MD  ezetimibe  (ZETIA) 10 MG tablet Take 1 tablet (10 mg total) by mouth daily. 02/02/17   Larey Dresser, MD  gabapentin (NEURONTIN) 100 MG capsule Take 100 mg by mouth at bedtime.  02/11/17   [provider]  hydrALAZINE (APRESOLINE) 25 MG tablet Take 1 tablet (25 mg total) by mouth 2 (two) times daily. 07/08/17   Danford, Suann Larry, MD  hydrALAZINE (APRESOLINE) 25 MG tablet Take 2 tablets (50 mg total) by mouth 3 (three) times daily. 12/04/17   Charlesetta Shanks, MD  Insulin Human (INSULIN PUMP) SOLN Inject into the skin continuous. NOVOLOG    [provider]  isosorbide mononitrate (IMDUR) 60 MG 24 hr tablet Take 1 tablet (60 mg total) by mouth daily. 10/05/17   Larey Dresser, MD  losartan (COZAAR) 100 MG tablet Take 1 tablet (100 mg total) by mouth daily. 12/04/17   Charlesetta Shanks, MD  losartan (COZAAR) 50 MG tablet Take 1 tablet (50 mg total) by mouth daily. 04/14/17   Hongalgi, Lenis Dickinson, MD  metoprolol succinate (TOPROL-XL) 25 MG 24 hr tablet Take 1 tablet (25 mg total) by mouth at bedtime. 04/13/17   Hongalgi,  Lenis Dickinson, MD  naproxen (NAPROSYN) 500 MG tablet Take 1 tablet (500 mg total) by mouth 2 (two) times daily as needed for mild pain or moderate pain. Please take this medication with meals, as it can upset your stomach. 09/13/17   Thomasene Ripple, MD  pantoprazole (PROTONIX) 40 MG tablet Take 40 mg by mouth daily.  01/07/17   [provider]  Propylene Glycol (SYSTANE BALANCE) 0.6 % SOLN Place 1 drop into both eyes as needed (dry eyes).    [provider]  silver sulfADIAZINE (SILVADENE) 1 % cream Apply 1 application topically daily. 09/16/17   Wallene Huh, DPM    Family History Family History  Problem Relation Age of Onset  . CAD Father   . Colon cancer Father   . Diabetes Brother     Social History Social History   Tobacco Use  . Smoking status: Former Research scientist (life sciences)  . Smokeless tobacco: Never Used  Substance Use Topics  . Alcohol use: No    Alcohol/week:  0.0 oz  . Drug use: No     Allergies   Patient has no known allergies.   Review of Systems Review of Systems 10 Systems reviewed and are negative for acute change except as noted in the HPI.   Physical Exam Updated Vital Signs BP (!) 195/78   Pulse 63   Temp 98.1 F (36.7 C) (Oral)   Resp 18   Wt 63.5 kg (140 lb)   SpO2 99%   BMI 21.29 kg/m   Physical Exam  Constitutional: He is oriented to person, place, and time. He appears well-developed and well-nourished.  HENT:  Head: Normocephalic and atraumatic.  Eyes: Conjunctivae are normal.  Neck: Neck supple.  Cardiovascular: Normal rate and regular rhythm.  No murmur heard. Pulmonary/Chest: Effort normal and breath sounds normal. No respiratory distress.  Abdominal: Soft. There is no tenderness.  Musculoskeletal: He exhibits no edema or tenderness.  Neurological: He is alert and oriented to person, place, and time. No cranial nerve deficit. He exhibits normal muscle tone. Coordination normal.  Skin: Skin is warm and dry.  Psychiatric: He has a normal mood and affect.  Nursing note and vitals reviewed.    ED Treatments / Results  Labs (all labs ordered are listed, but only abnormal results are displayed) Labs Reviewed  CBC WITH DIFFERENTIAL/PLATELET - Abnormal; Notable for the following components:      Result Value   RBC 4.04 (*)    Hemoglobin 12.9 (*)    Platelets 134 (*)    All other components within normal limits  COMPREHENSIVE METABOLIC PANEL - Abnormal; Notable for the following components:   Potassium 5.3 (*)    Chloride 97 (*)    Glucose, Bld 150 (*)    BUN 21 (*)    Creatinine, Ser 5.95 (*)    GFR calc non Af Amer 9 (*)    GFR calc Af Amer 11 (*)    All other components within normal limits  TROPONIN I - Abnormal; Notable for the following components:   Troponin I 0.03 (*)    All other components within normal limits  TROPONIN I - Abnormal; Notable for the following components:   Troponin I 0.03  (*)    All other components within normal limits  I-STAT CG4 LACTIC ACID, ED    EKG EKG Interpretation  Date/Time:  Saturday December 04 2017 06:16:29 EDT Ventricular Rate:  64 PR Interval:    QRS Duration: 102 QT Interval:  461 QTC  Calculation: 476 R Axis:   95 Text Interpretation:  Sinus rhythm Probable left atrial enlargement Right axis deviation Consider left ventricular hypertrophy Repol abnrm suggests ischemia, lateral leads No significant change was found Confirmed by Shanon Rosser [phone]) on 12/04/2017 6:29:47 AM Also confirmed by Shanon Rosser [phone]), editor Philomena Doheny [phone])  on 12/04/2017 8:27:48 AM   Radiology Dg Chest 2 View  Result Date: 12/04/2017 CLINICAL DATA:  Initial evaluation for acute chest pain. EXAM: CHEST - 2 VIEW COMPARISON:  Prior radiograph from 09/12/2017. FINDINGS: Median sternotomy wires underlying CABG markers. Mild cardiomegaly, stable. Mediastinal silhouette normal. Aortic atherosclerosis. Lungs normally inflated. Left basilar scarring similar to previous. No focal infiltrates. Mild scattered peribronchial thickening, which could reflect sequelae of acute bronchiolitis and/or mild interstitial congestion. No frank pulmonary edema. No pleural effusion. No pneumothorax. No acute osseous abnormality. IMPRESSION: 1. Mild diffuse peribronchial thickening, which could reflect sequelae of acute bronchiolitis and/or mild pulmonary interstitial congestion. No focal infiltrates to suggest pneumonia. 2. Stable cardiomegaly sequelae of prior CABG. Electronically Signed   By: Jeannine Boga M.D.   On: 12/04/2017 06:56    Procedures Procedures (including critical care time)  Medications Ordered in ED Medications  hydrALAZINE (APRESOLINE) tablet 25 mg (25 mg Oral Given 12/04/17 0819)  hydrALAZINE (APRESOLINE) tablet 25 mg (25 mg Oral Given 12/04/17 0819)  lisinopril (PRINIVIL,ZESTRIL) tablet 10 mg (10 mg Oral Given 12/04/17 6712)     Initial Impression /  Assessment and Plan / ED Course  I have reviewed the triage vital signs and the nursing notes.  Pertinent labs & imaging results that were available during my care of the patient were reviewed by me and considered in my medical decision making (see chart for details).      Final Clinical Impressions(s) / ED Diagnoses   Final diagnoses:  Atypical chest pain  Severe comorbid illness  Cough, persistent   Patient describes symptoms over greater than 2 weeks upwards of a month.  Main symptom is cough.  It does not have any focal pneumonia.  He is afebrile.  He has no leukocytosis.  Clinically he has well appearance.  At this time I have very low suspicion for bacterial pneumonia.  Patient is on his regular's schedule for dialysis.  Lungs are clear.  He does not have respiratory distress or hypoxia.  At this time I do not suspect significant fluid overload.  Has no peripheral edema.  Patient has 2 sets of stable troponins.  EKG is chronically shows LVH with repolarization abnormalities but no changes acutely.  Patient does not have chest pain at this time.  This is something he notes periodically over greater than a month.  She had hospitalization for rule out in December.  Although patient chronically has high risk of coronary artery disease, at this time I do not suspect acute ischemia.  Patient's blood pressures are poorly controlled.  Describes best control at home being systolic 458K-998P with episodes of pressures up in the 220s.  He did seem to be some confusion of the patient's medications.  He has 2 ARB's with Olmesartan having been prescribed, he already has losartan 50 mg daily,  just recently per his wife.  This is from 2 different prescribers.  This will need to be clarified with his primary doctor.  At this time, I have counseled the patient to increase his losartan to 100 mg daily and not to take the olmesartan (that I think may be a drug class duplicate).  The patient describes poor  hypertensive control at home.  I will also have him increase his hydralazine to 50 mg 3 times a day.  He is to continue his M door and other medications as prescribed.  Patient is counseled to keep a log of his blood pressures and address this with his provider.  At discharge patient is alert and in no distress.  He has no pain.  Diagnostic workup is stable. ED Discharge Orders        Ordered    hydrALAZINE (APRESOLINE) 25 MG tablet  3 times daily     12/04/17 1027    losartan (COZAAR) 100 MG tablet  Daily     12/04/17 1027       Charlesetta Shanks, MD 12/04/17 1039

## 2017-12-04 NOTE — ED Notes (Signed)
Date and time results received: 12/04/17 1915 (use smartphrase ".now" to insert current time)  Test: troponin Critical Value: 0.03  Name of Provider Notified: Dr. Johnney Killian  Orders Received? Or Actions Taken?: no new orders

## 2017-12-04 NOTE — ED Triage Notes (Signed)
Pt has dialysis M,W,F and had dialysis yesterday as scheduled.

## 2017-12-04 NOTE — ED Triage Notes (Signed)
Pt c/o chest pain. Pt has has had fever and URI x 2 weeks.

## 2017-12-09 DIAGNOSIS — E11621 Type 2 diabetes mellitus with foot ulcer: Secondary | ICD-10-CM | POA: Diagnosis not present

## 2017-12-10 ENCOUNTER — Other Ambulatory Visit (HOSPITAL_COMMUNITY): Payer: Self-pay | Admitting: *Deleted

## 2017-12-10 MED ORDER — HYDRALAZINE HCL 25 MG PO TABS: 25.0000 mg | ORAL_TABLET | Freq: Two times a day (BID) | ORAL | 3 refills | Status: DC

## 2017-12-16 ENCOUNTER — Encounter (HOSPITAL_BASED_OUTPATIENT_CLINIC_OR_DEPARTMENT_OTHER): Payer: Medicare Other | Attending: Internal Medicine

## 2017-12-16 DIAGNOSIS — I2510 Atherosclerotic heart disease of native coronary artery without angina pectoris: Secondary | ICD-10-CM | POA: Diagnosis not present

## 2017-12-16 DIAGNOSIS — I509 Heart failure, unspecified: Secondary | ICD-10-CM | POA: Insufficient documentation

## 2017-12-16 DIAGNOSIS — E1122 Type 2 diabetes mellitus with diabetic chronic kidney disease: Secondary | ICD-10-CM | POA: Diagnosis not present

## 2017-12-16 DIAGNOSIS — L97528 Non-pressure chronic ulcer of other part of left foot with other specified severity: Secondary | ICD-10-CM | POA: Insufficient documentation

## 2017-12-16 DIAGNOSIS — N186 End stage renal disease: Secondary | ICD-10-CM | POA: Insufficient documentation

## 2017-12-16 DIAGNOSIS — I252 Old myocardial infarction: Secondary | ICD-10-CM | POA: Diagnosis not present

## 2017-12-16 DIAGNOSIS — E11621 Type 2 diabetes mellitus with foot ulcer: Secondary | ICD-10-CM | POA: Insufficient documentation

## 2017-12-16 DIAGNOSIS — I132 Hypertensive heart and chronic kidney disease with heart failure and with stage 5 chronic kidney disease, or end stage renal disease: Secondary | ICD-10-CM | POA: Diagnosis not present

## 2017-12-16 DIAGNOSIS — E1140 Type 2 diabetes mellitus with diabetic neuropathy, unspecified: Secondary | ICD-10-CM | POA: Diagnosis not present

## 2017-12-23 DIAGNOSIS — E11621 Type 2 diabetes mellitus with foot ulcer: Secondary | ICD-10-CM | POA: Diagnosis not present

## 2017-12-30 DIAGNOSIS — E11621 Type 2 diabetes mellitus with foot ulcer: Secondary | ICD-10-CM | POA: Diagnosis not present

## 2018-01-06 DIAGNOSIS — E11621 Type 2 diabetes mellitus with foot ulcer: Secondary | ICD-10-CM | POA: Diagnosis not present

## 2018-01-13 ENCOUNTER — Encounter (HOSPITAL_BASED_OUTPATIENT_CLINIC_OR_DEPARTMENT_OTHER): Payer: Medicare Other | Attending: Internal Medicine

## 2018-01-13 DIAGNOSIS — I2510 Atherosclerotic heart disease of native coronary artery without angina pectoris: Secondary | ICD-10-CM | POA: Insufficient documentation

## 2018-01-13 DIAGNOSIS — I132 Hypertensive heart and chronic kidney disease with heart failure and with stage 5 chronic kidney disease, or end stage renal disease: Secondary | ICD-10-CM | POA: Insufficient documentation

## 2018-01-13 DIAGNOSIS — L97522 Non-pressure chronic ulcer of other part of left foot with fat layer exposed: Secondary | ICD-10-CM | POA: Insufficient documentation

## 2018-01-13 DIAGNOSIS — I252 Old myocardial infarction: Secondary | ICD-10-CM | POA: Diagnosis not present

## 2018-01-13 DIAGNOSIS — E1140 Type 2 diabetes mellitus with diabetic neuropathy, unspecified: Secondary | ICD-10-CM | POA: Diagnosis not present

## 2018-01-13 DIAGNOSIS — I509 Heart failure, unspecified: Secondary | ICD-10-CM | POA: Insufficient documentation

## 2018-01-13 DIAGNOSIS — N186 End stage renal disease: Secondary | ICD-10-CM | POA: Diagnosis not present

## 2018-01-13 DIAGNOSIS — Z794 Long term (current) use of insulin: Secondary | ICD-10-CM | POA: Insufficient documentation

## 2018-01-13 DIAGNOSIS — E11621 Type 2 diabetes mellitus with foot ulcer: Secondary | ICD-10-CM | POA: Diagnosis not present

## 2018-01-13 DIAGNOSIS — E1122 Type 2 diabetes mellitus with diabetic chronic kidney disease: Secondary | ICD-10-CM | POA: Insufficient documentation

## 2018-01-20 DIAGNOSIS — E11621 Type 2 diabetes mellitus with foot ulcer: Secondary | ICD-10-CM | POA: Diagnosis not present

## 2018-01-27 DIAGNOSIS — E11621 Type 2 diabetes mellitus with foot ulcer: Secondary | ICD-10-CM | POA: Diagnosis not present

## 2018-02-07 ENCOUNTER — Other Ambulatory Visit (HOSPITAL_COMMUNITY): Payer: Self-pay | Admitting: Cardiology

## 2018-02-10 DIAGNOSIS — E11621 Type 2 diabetes mellitus with foot ulcer: Secondary | ICD-10-CM | POA: Diagnosis not present

## 2018-02-15 ENCOUNTER — Encounter (HOSPITAL_BASED_OUTPATIENT_CLINIC_OR_DEPARTMENT_OTHER): Payer: Medicare Other | Attending: Internal Medicine

## 2018-02-15 DIAGNOSIS — I2510 Atherosclerotic heart disease of native coronary artery without angina pectoris: Secondary | ICD-10-CM | POA: Insufficient documentation

## 2018-02-15 DIAGNOSIS — E1140 Type 2 diabetes mellitus with diabetic neuropathy, unspecified: Secondary | ICD-10-CM | POA: Insufficient documentation

## 2018-02-15 DIAGNOSIS — N186 End stage renal disease: Secondary | ICD-10-CM | POA: Diagnosis not present

## 2018-02-15 DIAGNOSIS — I252 Old myocardial infarction: Secondary | ICD-10-CM | POA: Insufficient documentation

## 2018-02-15 DIAGNOSIS — Z8631 Personal history of diabetic foot ulcer: Secondary | ICD-10-CM | POA: Diagnosis not present

## 2018-02-15 DIAGNOSIS — Z09 Encounter for follow-up examination after completed treatment for conditions other than malignant neoplasm: Secondary | ICD-10-CM | POA: Diagnosis not present

## 2018-02-15 DIAGNOSIS — E1122 Type 2 diabetes mellitus with diabetic chronic kidney disease: Secondary | ICD-10-CM | POA: Diagnosis not present

## 2018-02-15 DIAGNOSIS — I132 Hypertensive heart and chronic kidney disease with heart failure and with stage 5 chronic kidney disease, or end stage renal disease: Secondary | ICD-10-CM | POA: Diagnosis not present

## 2018-02-15 DIAGNOSIS — I509 Heart failure, unspecified: Secondary | ICD-10-CM | POA: Insufficient documentation

## 2018-03-03 ENCOUNTER — Other Ambulatory Visit (HOSPITAL_COMMUNITY): Payer: Self-pay | Admitting: Cardiology

## 2018-03-30 ENCOUNTER — Inpatient Hospital Stay (HOSPITAL_COMMUNITY)
Admission: EM | Admit: 2018-03-30 | Discharge: 2018-04-03 | DRG: 246 | Disposition: A | Discharge status: Home / Self Care | Payer: Medicare Other | Source: Other Acute Inpatient Hospital | Attending: Interventional Cardiology | Admitting: Interventional Cardiology

## 2018-03-30 ENCOUNTER — Encounter (HOSPITAL_COMMUNITY)
Admission: EM | Disposition: A | Discharge status: Home / Self Care | Payer: Self-pay | Attending: Interventional Cardiology

## 2018-03-30 ENCOUNTER — Emergency Department (HOSPITAL_COMMUNITY): Payer: Medicare Other

## 2018-03-30 ENCOUNTER — Other Ambulatory Visit: Payer: Self-pay

## 2018-03-30 ENCOUNTER — Encounter (HOSPITAL_COMMUNITY): Payer: Self-pay

## 2018-03-30 DIAGNOSIS — R0602 Shortness of breath: Secondary | ICD-10-CM | POA: Diagnosis not present

## 2018-03-30 DIAGNOSIS — Z800 Family history of malignant neoplasm of digestive organs: Secondary | ICD-10-CM

## 2018-03-30 DIAGNOSIS — Z7902 Long term (current) use of antithrombotics/antiplatelets: Secondary | ICD-10-CM

## 2018-03-30 DIAGNOSIS — N2581 Secondary hyperparathyroidism of renal origin: Secondary | ICD-10-CM | POA: Diagnosis present

## 2018-03-30 DIAGNOSIS — E785 Hyperlipidemia, unspecified: Secondary | ICD-10-CM | POA: Diagnosis present

## 2018-03-30 DIAGNOSIS — T82855A Stenosis of coronary artery stent, initial encounter: Secondary | ICD-10-CM | POA: Diagnosis present

## 2018-03-30 DIAGNOSIS — I25810 Atherosclerosis of coronary artery bypass graft(s) without angina pectoris: Secondary | ICD-10-CM | POA: Diagnosis present

## 2018-03-30 DIAGNOSIS — I5033 Acute on chronic diastolic (congestive) heart failure: Secondary | ICD-10-CM | POA: Diagnosis not present

## 2018-03-30 DIAGNOSIS — N186 End stage renal disease: Secondary | ICD-10-CM | POA: Diagnosis present

## 2018-03-30 DIAGNOSIS — E1122 Type 2 diabetes mellitus with diabetic chronic kidney disease: Secondary | ICD-10-CM | POA: Diagnosis present

## 2018-03-30 DIAGNOSIS — R531 Weakness: Secondary | ICD-10-CM | POA: Diagnosis not present

## 2018-03-30 DIAGNOSIS — Z87891 Personal history of nicotine dependence: Secondary | ICD-10-CM

## 2018-03-30 DIAGNOSIS — I5022 Chronic systolic (congestive) heart failure: Secondary | ICD-10-CM

## 2018-03-30 DIAGNOSIS — Y831 Surgical operation with implant of artificial internal device as the cause of abnormal reaction of the patient, or of later complication, without mention of misadventure at the time of the procedure: Secondary | ICD-10-CM | POA: Diagnosis present

## 2018-03-30 DIAGNOSIS — I110 Hypertensive heart disease with heart failure: Secondary | ICD-10-CM

## 2018-03-30 DIAGNOSIS — R079 Chest pain, unspecified: Secondary | ICD-10-CM

## 2018-03-30 DIAGNOSIS — I5032 Chronic diastolic (congestive) heart failure: Secondary | ICD-10-CM | POA: Diagnosis present

## 2018-03-30 DIAGNOSIS — I361 Nonrheumatic tricuspid (valve) insufficiency: Secondary | ICD-10-CM | POA: Diagnosis not present

## 2018-03-30 DIAGNOSIS — R05 Cough: Secondary | ICD-10-CM | POA: Diagnosis not present

## 2018-03-30 DIAGNOSIS — E875 Hyperkalemia: Secondary | ICD-10-CM | POA: Diagnosis present

## 2018-03-30 DIAGNOSIS — I132 Hypertensive heart and chronic kidney disease with heart failure and with stage 5 chronic kidney disease, or end stage renal disease: Secondary | ICD-10-CM | POA: Diagnosis present

## 2018-03-30 DIAGNOSIS — I480 Paroxysmal atrial fibrillation: Secondary | ICD-10-CM | POA: Diagnosis present

## 2018-03-30 DIAGNOSIS — Z992 Dependence on renal dialysis: Secondary | ICD-10-CM | POA: Diagnosis not present

## 2018-03-30 DIAGNOSIS — Z794 Long term (current) use of insulin: Secondary | ICD-10-CM | POA: Diagnosis not present

## 2018-03-30 DIAGNOSIS — I25110 Atherosclerotic heart disease of native coronary artery with unstable angina pectoris: Secondary | ICD-10-CM

## 2018-03-30 DIAGNOSIS — Z8249 Family history of ischemic heart disease and other diseases of the circulatory system: Secondary | ICD-10-CM

## 2018-03-30 DIAGNOSIS — I214 Non-ST elevation (NSTEMI) myocardial infarction: Principal | ICD-10-CM | POA: Diagnosis present

## 2018-03-30 DIAGNOSIS — E118 Type 2 diabetes mellitus with unspecified complications: Secondary | ICD-10-CM | POA: Diagnosis present

## 2018-03-30 DIAGNOSIS — I252 Old myocardial infarction: Secondary | ICD-10-CM

## 2018-03-30 DIAGNOSIS — I25118 Atherosclerotic heart disease of native coronary artery with other forms of angina pectoris: Secondary | ICD-10-CM | POA: Diagnosis present

## 2018-03-30 DIAGNOSIS — Z833 Family history of diabetes mellitus: Secondary | ICD-10-CM

## 2018-03-30 DIAGNOSIS — Z7982 Long term (current) use of aspirin: Secondary | ICD-10-CM | POA: Diagnosis not present

## 2018-03-30 DIAGNOSIS — D631 Anemia in chronic kidney disease: Secondary | ICD-10-CM | POA: Diagnosis present

## 2018-03-30 DIAGNOSIS — E1169 Type 2 diabetes mellitus with other specified complication: Secondary | ICD-10-CM | POA: Diagnosis present

## 2018-03-30 DIAGNOSIS — I2510 Atherosclerotic heart disease of native coronary artery without angina pectoris: Secondary | ICD-10-CM | POA: Diagnosis present

## 2018-03-30 DIAGNOSIS — Z955 Presence of coronary angioplasty implant and graft: Secondary | ICD-10-CM

## 2018-03-30 HISTORY — PX: LEFT HEART CATH AND CORS/GRAFTS ANGIOGRAPHY: CATH118250

## 2018-03-30 HISTORY — PX: CORONARY STENT INTERVENTION: CATH118234

## 2018-03-30 LAB — BASIC METABOLIC PANEL
Anion gap: 12 (ref 5–15)
BUN: 32 mg/dL — AB (ref 6–20)
CHLORIDE: 97 mmol/L — AB (ref 98–111)
CO2: 28 mmol/L (ref 22–32)
CREATININE: 8.02 mg/dL — AB (ref 0.61–1.24)
Calcium: 9.3 mg/dL (ref 8.9–10.3)
GFR, EST AFRICAN AMERICAN: 7 mL/min — AB (ref >60)
GFR, EST NON AFRICAN AMERICAN: 6 mL/min — AB (ref >60)
Glucose, Bld: 139 mg/dL — ABNORMAL HIGH (ref 70–99)
Potassium: 6 mmol/L — ABNORMAL HIGH (ref 3.5–5.1)
SODIUM: 137 mmol/L (ref 135–145)

## 2018-03-30 LAB — GLUCOSE, RANDOM: Glucose, Bld: 155 mg/dL — ABNORMAL HIGH (ref 70–99)

## 2018-03-30 LAB — LIPID PANEL
CHOL/HDL RATIO: 3.1 ratio
Cholesterol: 69 mg/dL (ref 0–200)
HDL: 22 mg/dL — AB (ref >40)
LDL CALC: 36 mg/dL (ref 0–99)
TRIGLYCERIDES: 53 mg/dL (ref <150)
VLDL: 11 mg/dL (ref 0–40)

## 2018-03-30 LAB — GLUCOSE, CAPILLARY
GLUCOSE-CAPILLARY: 138 mg/dL — AB (ref 70–99)
Glucose-Capillary: 147 mg/dL — ABNORMAL HIGH (ref 70–99)
Glucose-Capillary: 199 mg/dL — ABNORMAL HIGH (ref 70–99)

## 2018-03-30 LAB — TROPONIN I
TROPONIN I: 0.11 ng/mL — AB (ref <0.03)
TROPONIN I: 0.36 ng/mL — AB (ref <0.03)

## 2018-03-30 LAB — CBG MONITORING, ED: GLUCOSE-CAPILLARY: 132 mg/dL — AB (ref 70–99)

## 2018-03-30 LAB — CREATININE, SERUM
CREATININE: 8.29 mg/dL — AB (ref 0.61–1.24)
GFR calc non Af Amer: 6 mL/min — ABNORMAL LOW (ref >60)
GFR, EST AFRICAN AMERICAN: 7 mL/min — AB (ref >60)

## 2018-03-30 LAB — POCT ACTIVATED CLOTTING TIME
ACTIVATED CLOTTING TIME: 169 s
Activated Clotting Time: 246 seconds
Activated Clotting Time: 346 seconds
Activated Clotting Time: 202 seconds

## 2018-03-30 LAB — CBC
HCT: 28.6 % — ABNORMAL LOW (ref 39.0–52.0)
HCT: 26 % — ABNORMAL LOW (ref 39.0–52.0)
Hemoglobin: 9.2 g/dL — ABNORMAL LOW (ref 13.0–17.0)
Hemoglobin: 8.6 g/dL — ABNORMAL LOW (ref 13.0–17.0)
MCH: 31.4 pg (ref 26.0–34.0)
MCH: 31.9 pg (ref 26.0–34.0)
MCHC: 32.2 g/dL (ref 30.0–36.0)
MCHC: 33.1 g/dL (ref 30.0–36.0)
MCV: 97.6 fL (ref 78.0–100.0)
MCV: 96.3 fL (ref 78.0–100.0)
PLATELETS: 150 10*3/uL (ref 150–400)
Platelets: 130 10*3/uL — ABNORMAL LOW (ref 150–400)
RBC: 2.93 MIL/uL — AB (ref 4.22–5.81)
RBC: 2.7 MIL/uL — AB (ref 4.22–5.81)
RDW: 14.3 % (ref 11.5–15.5)
RDW: 14.5 % (ref 11.5–15.5)
WBC: 8.7 10*3/uL (ref 4.0–10.5)
WBC: 6.9 10*3/uL (ref 4.0–10.5)

## 2018-03-30 LAB — I-STAT TROPONIN, ED: TROPONIN I, POC: 0.11 ng/mL — AB (ref 0.00–0.08)

## 2018-03-30 LAB — POTASSIUM: Potassium: 5.1 mmol/L (ref 3.5–5.1)

## 2018-03-30 LAB — MRSA PCR SCREENING: MRSA by PCR: NEGATIVE

## 2018-03-30 SURGERY — LEFT HEART CATH AND CORS/GRAFTS ANGIOGRAPHY
Anesthesia: LOCAL

## 2018-03-30 MED ORDER — CLOPIDOGREL BISULFATE 75 MG PO TABS: 75.0000 mg | ORAL_TABLET | Freq: Every day | ORAL | Status: DC

## 2018-03-30 MED ORDER — THE SENSUOUS HEART BOOK
Freq: Once | Status: AC
  Administered 2018-03-30: 22:00:00 1
  Filled 2018-03-30: qty 1

## 2018-03-30 MED ORDER — CALCIUM ACETATE (PHOS BINDER) 667 MG PO CAPS
667.0000 mg | ORAL_CAPSULE | Freq: Three times a day (TID) | ORAL | Status: DC
  Administered 2018-03-30 – 2018-04-03 (×10): 667 mg via ORAL
  Filled 2018-03-30 (×10): qty 1

## 2018-03-30 MED ORDER — SODIUM CHLORIDE 0.9% FLUSH
3.0000 mL | Freq: Two times a day (BID) | INTRAVENOUS | Status: DC
  Administered 2018-03-30 – 2018-04-03 (×6): 3 mL via INTRAVENOUS

## 2018-03-30 MED ORDER — INSULIN ASPART 100 UNIT/ML ~~LOC~~ SOLN
5.0000 [IU] | Freq: Once | SUBCUTANEOUS | Status: AC
  Administered 2018-03-30: 5 [IU] via INTRAVENOUS
  Filled 2018-03-30: qty 1

## 2018-03-30 MED ORDER — HEPARIN SODIUM (PORCINE) 1000 UNIT/ML IJ SOLN
INTRAMUSCULAR | Status: DC | PRN
  Administered 2018-03-30: 6000 [IU] via INTRAVENOUS

## 2018-03-30 MED ORDER — DEXTROSE 50 % IV SOLN
1.0000 | Freq: Once | INTRAVENOUS | Status: AC
  Administered 2018-03-30: 50 mL via INTRAVENOUS
  Filled 2018-03-30: qty 50

## 2018-03-30 MED ORDER — ASPIRIN 81 MG PO CHEW: 324.0000 mg | CHEWABLE_TABLET | Freq: Once | ORAL | Status: DC

## 2018-03-30 MED ORDER — ALBUTEROL SULFATE (2.5 MG/3ML) 0.083% IN NEBU
10.0000 mg | INHALATION_SOLUTION | Freq: Once | RESPIRATORY_TRACT | Status: AC
  Administered 2018-03-30: 10 mg via RESPIRATORY_TRACT
  Filled 2018-03-30: qty 12

## 2018-03-30 MED ORDER — MIDAZOLAM HCL 2 MG/2ML IJ SOLN
INTRAMUSCULAR | Status: AC
  Filled 2018-03-30: qty 2

## 2018-03-30 MED ORDER — NITROGLYCERIN 1 MG/10 ML FOR IR/CATH LAB
INTRA_ARTERIAL | Status: DC | PRN
  Administered 2018-03-30: 200 ug via INTRACORONARY

## 2018-03-30 MED ORDER — HYDRALAZINE HCL 25 MG PO TABS
25.0000 mg | ORAL_TABLET | Freq: Two times a day (BID) | ORAL | Status: DC
  Administered 2018-03-30 – 2018-04-02 (×7): 25 mg via ORAL
  Filled 2018-03-30 (×7): qty 1

## 2018-03-30 MED ORDER — HYDRALAZINE HCL 20 MG/ML IJ SOLN: 5.0000 mg | INTRAMUSCULAR | Status: AC | PRN

## 2018-03-30 MED ORDER — CLOPIDOGREL BISULFATE 75 MG PO TABS
75.0000 mg | ORAL_TABLET | Freq: Every day | ORAL | Status: DC
  Administered 2018-03-30 – 2018-04-03 (×5): 75 mg via ORAL
  Filled 2018-03-30 (×5): qty 1

## 2018-03-30 MED ORDER — GABAPENTIN 100 MG PO CAPS
100.0000 mg | ORAL_CAPSULE | Freq: Every day | ORAL | Status: DC
  Administered 2018-03-30 – 2018-04-02 (×4): 100 mg via ORAL
  Filled 2018-03-30 (×4): qty 1

## 2018-03-30 MED ORDER — ATORVASTATIN CALCIUM 80 MG PO TABS
80.0000 mg | ORAL_TABLET | Freq: Every day | ORAL | Status: DC
  Administered 2018-03-30 – 2018-04-03 (×5): 80 mg via ORAL
  Filled 2018-03-30: qty 1
  Filled 2018-03-30: qty 4
  Filled 2018-03-30 (×3): qty 1

## 2018-03-30 MED ORDER — LIDOCAINE HCL (PF) 1 % IJ SOLN
INTRAMUSCULAR | Status: DC | PRN
  Administered 2018-03-30: 20 mL

## 2018-03-30 MED ORDER — NEPRO/CARBSTEADY PO LIQD
237.0000 mL | Freq: Two times a day (BID) | ORAL | Status: DC
  Administered 2018-03-30 – 2018-04-03 (×6): 237 mL via ORAL
  Filled 2018-03-30 (×8): qty 237

## 2018-03-30 MED ORDER — INSULIN ASPART 100 UNIT/ML IV SOLN: 5.0000 [IU] | Freq: Once | INTRAVENOUS | Status: DC

## 2018-03-30 MED ORDER — PANTOPRAZOLE SODIUM 40 MG PO TBEC
40.0000 mg | DELAYED_RELEASE_TABLET | Freq: Every day | ORAL | Status: DC
  Administered 2018-03-30 – 2018-04-03 (×5): 40 mg via ORAL
  Filled 2018-03-30 (×5): qty 1

## 2018-03-30 MED ORDER — MIDAZOLAM HCL 2 MG/2ML IJ SOLN
INTRAMUSCULAR | Status: DC | PRN
  Administered 2018-03-30: 1 mg via INTRAVENOUS

## 2018-03-30 MED ORDER — HEPARIN (PORCINE) IN NACL 1000-0.9 UT/500ML-% IV SOLN
INTRAVENOUS | Status: DC | PRN
  Administered 2018-03-30 (×2): 500 mL

## 2018-03-30 MED ORDER — MORPHINE SULFATE (PF) 4 MG/ML IV SOLN
2.0000 mg | Freq: Once | INTRAVENOUS | Status: AC
  Administered 2018-03-30: 2 mg via INTRAVENOUS
  Filled 2018-03-30 (×2): qty 1

## 2018-03-30 MED ORDER — CHLORHEXIDINE GLUCONATE CLOTH 2 % EX PADS
6.0000 | MEDICATED_PAD | Freq: Every day | CUTANEOUS | Status: DC
  Administered 2018-03-30: 17:00:00 6 via TOPICAL

## 2018-03-30 MED ORDER — GUAIFENESIN 100 MG/5ML PO SOLN: 5.0000 mL | Freq: Four times a day (QID) | ORAL | Status: DC | PRN

## 2018-03-30 MED ORDER — NITROGLYCERIN IN D5W 200-5 MCG/ML-% IV SOLN
0.0000 ug/min | Freq: Once | INTRAVENOUS | Status: AC
  Administered 2018-03-30: 5 ug/min via INTRAVENOUS
  Filled 2018-03-30: qty 250

## 2018-03-30 MED ORDER — HEPARIN BOLUS VIA INFUSION
4000.0000 [IU] | Freq: Once | INTRAVENOUS | Status: AC
  Administered 2018-03-30: 4000 [IU] via INTRAVENOUS
  Filled 2018-03-30: qty 4000

## 2018-03-30 MED ORDER — HEPARIN (PORCINE) IN NACL 100-0.45 UNIT/ML-% IJ SOLN
850.0000 [IU]/h | INTRAMUSCULAR | Status: DC
  Administered 2018-03-30: 850 [IU]/h via INTRAVENOUS
  Filled 2018-03-30: qty 250

## 2018-03-30 MED ORDER — DOXERCALCIFEROL 4 MCG/2ML IV SOLN
3.0000 ug | INTRAVENOUS | Status: DC
  Administered 2018-03-31: 3 ug via INTRAVENOUS
  Filled 2018-03-30 (×2): qty 2

## 2018-03-30 MED ORDER — ACETAMINOPHEN 500 MG PO TABS: 500.0000 mg | ORAL_TABLET | Freq: Four times a day (QID) | ORAL | Status: DC | PRN

## 2018-03-30 MED ORDER — ONDANSETRON HCL 4 MG/2ML IJ SOLN: 4.0000 mg | Freq: Four times a day (QID) | INTRAMUSCULAR | Status: DC | PRN

## 2018-03-30 MED ORDER — SODIUM CHLORIDE 0.9 % IV SOLN: 250.0000 mL | INTRAVENOUS | Status: DC | PRN

## 2018-03-30 MED ORDER — METOPROLOL SUCCINATE ER 25 MG PO TB24
25.0000 mg | ORAL_TABLET | Freq: Every day | ORAL | Status: DC
  Administered 2018-03-30 – 2018-04-02 (×4): 25 mg via ORAL
  Filled 2018-03-30 (×4): qty 1

## 2018-03-30 MED ORDER — FENTANYL CITRATE (PF) 100 MCG/2ML IJ SOLN
INTRAMUSCULAR | Status: DC | PRN
  Administered 2018-03-30: 50 ug via INTRAVENOUS

## 2018-03-30 MED ORDER — INSULIN ASPART 100 UNIT/ML ~~LOC~~ SOLN
0.0000 [IU] | Freq: Three times a day (TID) | SUBCUTANEOUS | Status: DC
  Administered 2018-03-30: 17:00:00 3 [IU] via SUBCUTANEOUS
  Administered 2018-03-31: 8 [IU] via SUBCUTANEOUS
  Administered 2018-04-01: 19:00:00 2 [IU] via SUBCUTANEOUS
  Administered 2018-04-01: 07:00:00 8 [IU] via SUBCUTANEOUS
  Administered 2018-04-02: 3 [IU] via SUBCUTANEOUS
  Administered 2018-04-02: 5 [IU] via SUBCUTANEOUS
  Administered 2018-04-03 (×2): 3 [IU] via SUBCUTANEOUS

## 2018-03-30 MED ORDER — LABETALOL HCL 5 MG/ML IV SOLN: 10.0000 mg | INTRAVENOUS | Status: AC | PRN

## 2018-03-30 MED ORDER — HEPARIN (PORCINE) IN NACL 1000-0.9 UT/500ML-% IV SOLN
INTRAVENOUS | Status: AC
  Filled 2018-03-30: qty 500

## 2018-03-30 MED ORDER — NITROGLYCERIN 1 MG/10 ML FOR IR/CATH LAB
INTRA_ARTERIAL | Status: AC
  Filled 2018-03-30: qty 10

## 2018-03-30 MED ORDER — HEART ATTACK BOUNCING BOOK
Freq: Once | Status: AC
  Administered 2018-03-30: 1
  Filled 2018-03-30: qty 1

## 2018-03-30 MED ORDER — FENTANYL CITRATE (PF) 100 MCG/2ML IJ SOLN
INTRAMUSCULAR | Status: AC
  Filled 2018-03-30: qty 2

## 2018-03-30 MED ORDER — CITALOPRAM HYDROBROMIDE 20 MG PO TABS
20.0000 mg | ORAL_TABLET | Freq: Every day | ORAL | Status: DC
  Administered 2018-03-30 – 2018-04-03 (×5): 20 mg via ORAL
  Filled 2018-03-30 (×3): qty 1
  Filled 2018-03-30: qty 2
  Filled 2018-03-30: qty 1
  Filled 2018-03-30: qty 2

## 2018-03-30 MED ORDER — GUAIFENESIN 100 MG/5ML PO SOLN
5.0000 mL | Freq: Four times a day (QID) | ORAL | Status: DC | PRN
  Administered 2018-03-30 – 2018-04-01 (×3): 100 mg via ORAL
  Filled 2018-03-30 (×4): qty 5

## 2018-03-30 MED ORDER — ACETAMINOPHEN 325 MG PO TABS
650.0000 mg | ORAL_TABLET | ORAL | Status: DC | PRN
  Administered 2018-04-01: 650 mg via ORAL
  Filled 2018-03-30: qty 2

## 2018-03-30 MED ORDER — SODIUM CHLORIDE 0.9% FLUSH
3.0000 mL | INTRAVENOUS | Status: DC | PRN
  Administered 2018-04-02: 3 mL via INTRAVENOUS
  Filled 2018-03-30: qty 3

## 2018-03-30 MED ORDER — EZETIMIBE 10 MG PO TABS
10.0000 mg | ORAL_TABLET | Freq: Every day | ORAL | Status: DC
  Administered 2018-03-30 – 2018-04-03 (×5): 10 mg via ORAL
  Filled 2018-03-30 (×5): qty 1

## 2018-03-30 MED ORDER — IOHEXOL 350 MG/ML SOLN
INTRAVENOUS | Status: DC | PRN
  Administered 2018-03-30: 160 mL via INTRA_ARTERIAL

## 2018-03-30 MED ORDER — LIDOCAINE HCL (PF) 1 % IJ SOLN
INTRAMUSCULAR | Status: AC
  Filled 2018-03-30: qty 30

## 2018-03-30 MED ORDER — ASPIRIN 81 MG PO CHEW
81.0000 mg | CHEWABLE_TABLET | Freq: Every day | ORAL | Status: DC
  Administered 2018-03-30 – 2018-04-01 (×3): 81 mg via ORAL
  Filled 2018-03-30 (×3): qty 1

## 2018-03-30 MED ORDER — HEPARIN SODIUM (PORCINE) 5000 UNIT/ML IJ SOLN
5000.0000 [IU] | Freq: Three times a day (TID) | INTRAMUSCULAR | Status: DC
  Administered 2018-03-31 – 2018-04-01 (×4): 5000 [IU] via SUBCUTANEOUS
  Filled 2018-03-30 (×5): qty 1

## 2018-03-30 MED ORDER — ANGIOPLASTY BOOK
Freq: Once | Status: AC
  Administered 2018-03-30: 1
  Filled 2018-03-30: qty 1

## 2018-03-30 SURGICAL SUPPLY — 25 items
BALLN SAPPHIRE 2.5X20 (BALLOONS) ×2
BALLN SAPPHIRE ~~LOC~~ 2.75X12 (BALLOONS) ×2 IMPLANT
BALLN ~~LOC~~ EMERGE MR 3.5X20 (BALLOONS) ×2
BALLOON SAPPHIRE 2.5X20 (BALLOONS) ×1 IMPLANT
BALLOON ~~LOC~~ EMERGE MR 3.5X20 (BALLOONS) ×1 IMPLANT
CATH INFINITI 5 FR IM (CATHETERS) ×2 IMPLANT
CATH INFINITI 5FR AL1 (CATHETERS) ×2 IMPLANT
CATH INFINITI 5FR MULTPACK ANG (CATHETERS) ×2 IMPLANT
CATH LAUNCHER 6FR EBU3.5 (CATHETERS) ×2 IMPLANT
COVER PRB 48X5XTLSCP FOLD TPE (BAG) ×1 IMPLANT
COVER PROBE 5X48 (BAG) ×1
KIT ENCORE 26 ADVANTAGE (KITS) ×2 IMPLANT
KIT HEART LEFT (KITS) ×2 IMPLANT
KIT HEMO VALVE WATCHDOG (MISCELLANEOUS) ×2 IMPLANT
PACK CARDIAC CATHETERIZATION (CUSTOM PROCEDURE TRAY) ×2 IMPLANT
PAD PRO RADIOLUCENT 2001M-C (PAD) ×2 IMPLANT
SHEATH PINNACLE 5F 10CM (SHEATH) ×2 IMPLANT
SHEATH PINNACLE 6F 10CM (SHEATH) ×2 IMPLANT
STENT SYNERGY DES 3X12 (Permanent Stent) ×2 IMPLANT
STENT SYNERGY DES 3X38 (Permanent Stent) ×2 IMPLANT
TRANSDUCER W/STOPCOCK (MISCELLANEOUS) ×2 IMPLANT
TUBING CIL FLEX 10 FLL-RA (TUBING) ×2 IMPLANT
WIRE ASAHI PROWATER 180CM (WIRE) ×2 IMPLANT
WIRE EMERALD 3MM-J .035X150CM (WIRE) ×2 IMPLANT
WIRE EMERALD 3MM-J .035X260CM (WIRE) ×2 IMPLANT

## 2018-03-30 NOTE — Progress Notes (Signed)
ANTICOAGULATION CONSULT NOTE - Initial Consult  Pharmacy Consult for heparin Indication: chest pain/ACS  No Known Allergies  Patient Measurements: Height: 5\' 6"  (167.6 cm) Weight: 150 lb (68 kg) IBW/kg (Calculated) : 63.8 Heparin Dosing Weight: 68kg  Vital Signs:    Labs: Recent Labs    03/30/18 0719  HGB 9.2*  HCT 28.6*  PLT 150    CrCl cannot be calculated (Patient's most recent lab result is older than the maximum 21 days allowed.).   Medical History: Past Medical History:  Diagnosis Date  . Acute pulmonary edema ([place]) 05/31/2016  . Acute respiratory failure with hypoxemia ([place]) 05/31/2016  . CAD (coronary artery disease) 10/30/2016   NSTEMI 9/17 with CABG x 3 (LIMA-LAD, SVG-OM, SVG-RCA).   - NSTEMI 10/18 s/p DES to ostial SVG to  OM  . Diabetic foot ulcer ([place]) 04/11/2017  . 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 ESRD (end stage renal disease) on dialysis [facility])    "MWF; [place]" (01/18/2017)  . Gastroesophageal reflux disease 08/18/2016  . HCAP (healthcare-associated pneumonia)   . Hemodialysis status ([place])   . Hypertensive heart disease 09/12/2017  . Ischemic rest pain of lower extremity ([place]) 02/06/2015  . 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 NSTEMI (non-ST elevated myocardial infarction) ([place])   . Pleural effusion   . 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 heparin    . nitroGLYCERIN      Assessment: 28 yom presented to the ED with CP. To start heparin IV. Baseline Hgb is slightly low and platelets are WNL. He is not on anticoagulation PTA.   Goal of Therapy:  Heparin level 0.3-0.7 units/ml Monitor platelets by anticoagulation protocol: Yes   Plan:  Heparin bolus 4000 units IV x 1 Heparin gtt 850 units/hr Check an 8 hr heparin level Daily heparin level and CBC  Kenneth [name], Kenneth [name] 03/30/2018,7:44 AM

## 2018-03-30 NOTE — H&P (Addendum)
Cardiology H&P:   Patient ID: VERE DIANTONIO; [id number]; Aug 16, 1956   Admit date: 03/30/2018 Date of Consult: 03/30/2018  Primary Care Provider: [name], MD Primary Cardiologist: Dr. Aundra Dubin  Patient Profile:   Kenneth [name] is a 62 y.o. male with a hx of poorly controlled IDDM, HTN, HLD, ESRD on HD,  CAD s/p CABG in 2017, followed by PCI to the SVG-OM1 in 2018 and chronic systolic HF, who is being seen today for the evaluation of chest pain and elevated troponin, at the request of Dr. Tyrone Nine, Emergency Medicine.  History of Present Illness:   Kenneth [name] is a 62 y/o male with a h/o poorly controlled IDDM, HTN, HLD, ESRD on HD,  CAD and chronic systolic HF. He had a prolonged hospitalization from 05/31/16- 06/23/16. He initially presented with CP and dyspnea. He developed ARF/ cardiogenic shock requiring intubation and implantation of an IABP. Troponin was elevated ruling him in for NSTEMI. 2D echo showed reduced EF down to 35-40%. Subsequent LHC showed multivessel CAD. Once stabilized, he underwent CABG x 3 per Dr. Nils Pyle on 06/05/16 w/ LIMA-LAD, SVG-OM, SVG-RCA. He had post operative atrial fibrillation and required amiodarone. Also with worsening renal function and was placed on HD. He now goes 3 x week on MWF. EF during that time was 35-40%. He required milrinone that hospitalization and was ultimately weaned off. EF ultimately improved back to normal, but he is still followed in the [facility] by Dr. Aundra Dubin.   He was readmitted in October 2018 with chest pain and pulmonary edema. He ruled in for NSTEMI. Echo showed preserved EF but new WMAs. Cath showed 95% ostial SVG-OM1 stenosis and underwent PCI w/ DES to the SVG-OM1.   He is now presents to the [facility] ED with a complaint of substernal CP that began while in the middle of HD this morning. Pt notes he has had symptoms off and on for the last 6-8 weeks, but progressively worsening. Worse over the last 2 days. He had severe pain today at HD. Pain  feels somewhat similar to previous angina. Wife also notes productive cough, yellow sputum for several weeks and some subjective chills. CXR shows pulmonary vascular congestion and mild diffuse pulmonary edema. No PNA. WBC ct WNL.    Past Medical History:  Diagnosis Date  . Acute pulmonary edema ([place]) 05/31/2016  . Acute respiratory failure with hypoxemia ([place]) 05/31/2016  . CAD (coronary artery disease) 10/30/2016   NSTEMI 9/17 with CABG x 3 (LIMA-LAD, SVG-OM, SVG-RCA).   - NSTEMI 10/18 s/p DES to ostial SVG to  OM  . Diabetic foot ulcer ([place]) 04/11/2017  . 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 ESRD (end stage renal disease) on dialysis [facility])    "MWF; [place]" (01/18/2017)  . Gastroesophageal reflux disease 08/18/2016  . HCAP (healthcare-associated pneumonia)   . Hemodialysis status ([place])   . Hypertensive heart disease 09/12/2017  . Ischemic rest pain of lower extremity ([place]) 02/06/2015  . 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 NSTEMI (non-ST elevated myocardial infarction) ([place])   . Pleural effusion   . Pronation deformity of both feet 02/27/2015    Past Surgical History:  Procedure Laterality Date  . AV FISTULA PLACEMENT Left 06/22/2016   Procedure: ARTERIOVENOUS (AV) FISTULA CREATION LEFT UPPER ARM;  Surgeon: Rosetta Posner, MD;  Location: [place];  Service: Vascular;  Laterality: Left;  . CARDIAC CATHETERIZATION N/A 05/31/2016   Procedure: Left Heart Cath and Coronary Angiography;  Surgeon: Charlann Lange  Irish Lack, MD;  Location: [employer] CV LAB;  Service: Cardiovascular;  Laterality: N/A;  . CARDIAC CATHETERIZATION N/A 05/31/2016   Procedure: Right Heart Cath;  Surgeon: Jettie Booze, MD;  Location: [place] CV LAB;  Service: Cardiovascular;  Laterality: N/A;  . CARDIAC CATHETERIZATION N/A 05/31/2016   Procedure: IABP Insertion;  Surgeon: Jettie Booze, MD;  Location: [place] CV LAB;  Service: Cardiovascular;  Laterality: N/A;  . CORONARY ARTERY  BYPASS GRAFT N/A 06/05/2016   Procedure: CORONARY ARTERY BYPASS GRAFTING (CABG) x3 LIMA to LAD -SVG to OM -SVG to RCA;  Surgeon: Ivin Poot, MD;  Location: [place];  Service: Open Heart Surgery;  Laterality: N/A;  . CORONARY STENT INTERVENTION N/A 07/07/2017   Procedure: CORONARY STENT INTERVENTION;  Surgeon: Wellington Hampshire, MD;  Location: [place] CV LAB;  Service: Cardiovascular;  Laterality: N/A;  . INSERTION OF DIALYSIS CATHETER N/A 06/05/2016   Procedure: INSERTION OF DIALYSIS/trialysis CATHETER;  Surgeon: Ivin Poot, MD;  Location: [place];  Service: Vascular;  Laterality: N/A;  . INSERTION OF DIALYSIS CATHETER Right 06/13/2016   Procedure: INSERTION OF DIALYSIS CATHETER RIGHT INTERNAL JUGULAR;  Surgeon: Conrad Draper, MD;  Location: [place];  Service: Vascular;  Laterality: Right;  . INTRAOPERATIVE TRANSESOPHAGEAL ECHOCARDIOGRAM N/A 06/05/2016   Procedure: INTRAOPERATIVE TRANSESOPHAGEAL ECHOCARDIOGRAM;  Surgeon: Ivin Poot, MD;  Location: [place];  Service: Open Heart Surgery;  Laterality: N/A;  . LEFT HEART CATH AND CORS/GRAFTS ANGIOGRAPHY N/A 11/03/2016   Procedure: Left Heart Cath and Cors/Grafts Angiography;  Surgeon: Troy Sine, MD;  Location: [place] CV LAB;  Service: Cardiovascular;  Laterality: N/A;  . LEFT HEART CATH AND CORS/GRAFTS ANGIOGRAPHY N/A 07/07/2017   Procedure: LEFT HEART CATH AND CORS/GRAFTS ANGIOGRAPHY;  Surgeon: Larey Dresser, MD;  Location: [place] CV LAB;  Service: Cardiovascular;  Laterality: N/A;     Home Medications:  Prior to Admission medications   Medication Sig Start Date End Date Taking? Authorizing Provider  acetaminophen (TYLENOL) 500 MG tablet Take 500 mg by mouth every 6 (six) hours as needed for mild pain.    [provider]  aspirin 81 MG chewable tablet Chew 1 tablet (81 mg total) by mouth daily. 11/06/16   Caren Griffins, MD  atorvastatin (LIPITOR) 80 MG tablet TAKE 1 TABLET BY MOUTH ONCE DAILY 08/10/17   Larey Dresser, MD  calcium acetate (PHOSLO) 667 MG capsule Take 667 mg by mouth 3 (three) times daily with meals.  07/09/17   [provider]  citalopram (CELEXA) 20 MG tablet Take 20 mg by mouth daily. 02/25/17   [provider]  clopidogrel (PLAVIX) 75 MG tablet Take 1 tablet (75 mg total) by mouth daily. 01/12/17   Bensimhon, Shaune Pascal, MD  ezetimibe (ZETIA) 10 MG tablet Take 1 tablet (10 mg total) by mouth daily. 02/02/17   Larey Dresser, MD  ezetimibe (ZETIA) 10 MG tablet TAKE 1 TABLET BY MOUTH ONCE DAILY 02/08/18   Larey Dresser, MD  gabapentin (NEURONTIN) 100 MG capsule Take 100 mg by mouth at bedtime.  02/11/17   [provider]  hydrALAZINE (APRESOLINE) 25 MG tablet Take 1 tablet (25 mg total) by mouth 2 (two) times daily. 12/10/17   Larey Dresser, MD  Insulin Human (INSULIN PUMP) SOLN Inject into the skin continuous. NOVOLOG    [provider]  isosorbide mononitrate (IMDUR) 60 MG 24 hr tablet TAKE 1 TABLET BY MOUTH ONCE DAILY 03/03/18   Aundra Dubin,  Elby Showers, MD  losartan (COZAAR) 100 MG tablet Take 1 tablet (100 mg total) by mouth daily. 12/04/17   Charlesetta Shanks, MD  losartan (COZAAR) 50 MG tablet Take 1 tablet (50 mg total) by mouth daily. 04/14/17   Hongalgi, Lenis Dickinson, MD  metoprolol succinate (TOPROL-XL) 25 MG 24 hr tablet Take 1 tablet (25 mg total) by mouth at bedtime. 04/13/17   Hongalgi, Lenis Dickinson, MD  naproxen (NAPROSYN) 500 MG tablet Take 1 tablet (500 mg total) by mouth 2 (two) times daily as needed for mild pain or moderate pain. Please take this medication with meals, as it can upset your stomach. 09/13/17   Thomasene Ripple, MD  pantoprazole (PROTONIX) 40 MG tablet Take 40 mg by mouth daily.  01/07/17   [provider]  Propylene Glycol (SYSTANE BALANCE) 0.6 % SOLN Place 1 drop into both eyes as needed (dry eyes).    [provider]  silver sulfADIAZINE (SILVADENE) 1 % cream Apply 1 application topically daily. 09/16/17   Wallene Huh,  DPM    Inpatient Medications: Scheduled Meds:  Continuous Infusions: . heparin 850 Units/hr (03/30/18 0800)  . nitroGLYCERIN     PRN Meds:   Allergies:   No Known Allergies  Social History:   Social History   Socioeconomic History  . Marital status: Married    Spouse name: Not on file  . Number of children: Not on file  . Years of education: Not on file  . Highest education level: Not on file  Occupational History  . Not on file  Social Needs  . Financial resource strain: Not on file  . Food insecurity:    Worry: Not on file    Inability: Not on file  . Transportation needs:    Medical: Not on file    Non-medical: Not on file  Tobacco Use  . Smoking status: Former Research scientist (life sciences)  . Smokeless tobacco: Never Used  Substance and Sexual Activity  . Alcohol use: No    Alcohol/week: 0.0 oz  . Drug use: No  . Sexual activity: Not on file  Lifestyle  . Physical activity:    Days per week: Not on file    Minutes per session: Not on file  . Stress: Not on file  Relationships  . Social connections:    Talks on phone: Not on file    Gets together: Not on file    Attends religious service: Not on file    Active member of club or organization: Not on file    Attends meetings of clubs or organizations: Not on file    Relationship status: Not on file  . Intimate partner violence:    Fear of current or ex partner: Not on file    Emotionally abused: Not on file    Physically abused: Not on file    Forced sexual activity: Not on file  Other Topics Concern  . Not on file  Social History Narrative  . Not on file    Family History:    Family History  Problem Relation Age of Onset  . CAD Father   . Colon cancer Father   . Diabetes Brother      ROS:  Please see the history of present illness.   All other ROS reviewed and negative.     Physical Exam/Data:   Vitals:   03/30/18 0718 03/30/18 0746  BP:  (!) 173/77  Pulse:  79  Resp:  (!) 23  Temp:  98.2 F (36.8  C)    TempSrc:  Oral  SpO2:  97%  Weight: 150 lb (68 kg)   Height: 5\' 6"  (1.676 m)    No intake or output data in the 24 hours ending 03/30/18 0802 Filed Weights   03/30/18 0718  Weight: 150 lb (68 kg)   Body mass index is 24.21 kg/m.  General:  Well nourished, well developed, in no acute distress HEENT: normal Lymph: no adenopathy Neck: no JVD Endocrine:  No thryomegaly Vascular: No carotid bruits; FA pulses 2+ bilaterally without bruits  Cardiac:  normal S1, S2; RRR; no murmur  Lungs:  clear to auscultation bilaterally, no wheezing, rhonchi or rales  Abd: soft, nontender, no hepatomegaly  Ext: no edema Musculoskeletal:  No deformities, BUE and BLE strength normal and equal Skin: warm and dry  Neuro:  CNs 2-12 intact, no focal abnormalities noted Psych:  Normal affect   EKG:  The EKG was personally reviewed and demonstrates:  SR LVH w/ strain  Telemetry:  Telemetry was personally reviewed and demonstrates:  NSR  Relevant CV Studies:  CORONARY STENT INTERVENTION  Conclusion   Successful angioplasty and drug-eluting stent placement to the ostial SVG to OM.  Recommendations: Dual antiplatelet therapy for at least one year.  Aggressive treatment of risk factors.     Laboratory Data:  ChemistryNo results for input(s): NA, K, CL, CO2, GLUCOSE, BUN, CREATININE, CALCIUM, GFRNONAA, GFRAA, ANIONGAP in the last 168 hours.  No results for input(s): PROT, ALBUMIN, AST, ALT, ALKPHOS, BILITOT in the last 168 hours. Hematology Recent Labs  Lab 03/30/18 0719  WBC 8.7  RBC 2.93*  HGB 9.2*  HCT 28.6*  MCV 97.6  MCH 31.4  MCHC 32.2  RDW 14.3  PLT 150   Cardiac EnzymesNo results for input(s): TROPONINI in the last 168 hours.  Recent Labs  Lab 03/30/18 0733  TROPIPOC 0.11*    BNPNo results for input(s): BNP, PROBNP in the last 168 hours.  DDimer No results for input(s): DDIMER in the last 168 hours.  Radiology/Studies:  No results found.  Assessment and Plan:   Kenneth [name] is a 62 y.o. male with a hx of poorly controlled IDDM, HTN, HLD, ESRD on HD,  CAD s/p CABG in 2017, followed by PCI to the SVG-OM1 in 2018 and chronic systolic HF, who is being seen today for the evaluation of chest pain and elevated troponin, at the request of Dr. Tyrone Nine, Emergency Medicine.  Pt with active CP, and elevated troponin at 0.11. EKG with ST depression in lateral and inferior leads seen on prior though more pronounced than prior. No improvement in symptoms after initiation of IV heparin and IV nitro. Given his coronary history and nature of symptoms, we will proceed with definitive LHC. I have discussed case with Dr. Irish Lack, who has also seen the patient and agrees that he will need cath today. He will need HD post cath. We will consult nephrology to help assist with management.    For questions or updates, please contact [place] Please consult [email or web address] for contact info under Cardiology/STEMI.   Signed, Kenneth Jester, PA-C  03/30/2018 8:02 AM   I have examined the patient and reviewed assessment and plan and discussed with patient.  Agree with above as stated.  Despite increasing doses of IV NTG, pain is persisting.  Mildly elevated troponin.  Increased BP.  Pulm edema noted on CXR.  Equal radial pulses bilaterally.  ECG shows more prominent ST changes- in a a pattern of LVH with  strain.  No clear STEMI.    1) IV Morphine now for pain control.   2) Risks and benefits of cath explained to the patient and wife through the [name].  He is agreeable.  Will be going to cath lab soon. 3) Renal consult to help faciliate dialysis post cath  [facility]

## 2018-03-30 NOTE — ED Provider Notes (Signed)
[facility] EMERGENCY DEPARTMENT Provider Note   CSN: [id number] Arrival date & time: 03/30/18  0708  History   Chief Complaint Chief Complaint  Patient presents with  . Chest Pain    HPI Kenneth [name] is a 62 y.o. male with a medical history of NSTEMI s/p CABGx3 in 2017, ESRD on HD, systolic HF, and DMII who presents with chest pain. He reports that he has had chest discomfort for the past 2 months with associated cough and dyspnea, but two days ago the pain became much more severe. EMS was called to his dialysis center this morning because he was continuing to experience chest pain during HD. He did not finish dialysis today. EMS administered 325mg  aspirin and 2 SL nitro with no relief of chest pain. He describes the pain as 6/10 mid-chest stabbing pain that radiates into his left chest that feels similar to his previous MI. He endorses sweating, especially when eating, but denies nausea or vomiting.   Past Medical History:  Diagnosis Date  . Acute pulmonary edema ([place]) 05/31/2016  . Acute respiratory failure with hypoxemia ([place]) 05/31/2016  . CAD (coronary artery disease) 10/30/2016   NSTEMI 9/17 with CABG x 3 (LIMA-LAD, SVG-OM, SVG-RCA).   - NSTEMI 10/18 s/p DES to ostial SVG to  OM  . Diabetic foot ulcer ([place]) 04/11/2017  . 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 ESRD (end stage renal disease) on dialysis [facility])    "MWF; [place]" (01/18/2017)  . Gastroesophageal reflux disease 08/18/2016  . HCAP (healthcare-associated pneumonia)   . Hemodialysis status ([place])   . Hypertensive heart disease 09/12/2017  . Ischemic rest pain of lower extremity ([place]) 02/06/2015  . 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 NSTEMI (non-ST elevated myocardial infarction) ([place])   . Pleural effusion   . 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 Hypertensive heart disease 09/12/2017  . Hyperlipidemia 09/12/2017    . Hyperkalemia 07/20/2017  . Chronic diastolic CHF (congestive heart failure) ([place]) 04/14/2017  . Anemia, chronic renal failure 04/11/2017  . Diabetic foot ulcer ([place]) 04/11/2017  . Cellulitis in diabetic foot ([place]) 01/18/2017  . Chest pain 12/16/2016  . Pressure injury of skin 11/03/2016  . Diabetes mellitus with complication ([place]) 75/64/3329  . ESRD (end stage renal disease) ([place]) 10/30/2016  . CAD (coronary artery disease) 10/30/2016  . Gastroesophageal reflux disease 08/18/2016  . Non-ST elevation (NSTEMI) myocardial infarction ([place])   . 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 Ischemic rest pain of lower extremity ([place]) 02/06/2015    Past Surgical History:  Procedure Laterality Date  . AV FISTULA PLACEMENT Left 06/22/2016   Procedure: ARTERIOVENOUS (AV) FISTULA CREATION LEFT UPPER ARM;  Surgeon: Rosetta Posner, MD;  Location: [facility];  Service: Vascular;  Laterality: Left;  . CARDIAC CATHETERIZATION N/A 05/31/2016   Procedure: Left Heart Cath and Coronary Angiography;  Surgeon: Jettie Booze, MD;  Location: [facility] CV LAB;  Service: Cardiovascular;  Laterality: N/A;  . CARDIAC CATHETERIZATION N/A 05/31/2016   Procedure: Right Heart Cath;  Surgeon: Jettie Booze, MD;  Location: [facility] CV LAB;  Service: Cardiovascular;  Laterality: N/A;  . CARDIAC CATHETERIZATION N/A 05/31/2016   Procedure: IABP Insertion;  Surgeon: Jettie Booze, MD;  Location: [place] CV LAB;  Service: Cardiovascular;  Laterality: N/A;  . CORONARY ARTERY BYPASS GRAFT  N/A 06/05/2016   Procedure: CORONARY ARTERY BYPASS GRAFTING (CABG) x3 LIMA to LAD -SVG to OM -SVG to RCA;  Surgeon: Ivin Poot, MD;  Location: [facility];  Service: Open Heart Surgery;  Laterality: N/A;  . CORONARY STENT INTERVENTION N/A 07/07/2017   Procedure: CORONARY STENT INTERVENTION;  Surgeon: Wellington Hampshire, MD;  Location: [facility] CV LAB;  Service: Cardiovascular;  Laterality: N/A;  . INSERTION OF DIALYSIS CATHETER N/A 06/05/2016   Procedure: INSERTION OF DIALYSIS/trialysis CATHETER;  Surgeon: Ivin Poot, MD;  Location: [facility];  Service: Vascular;  Laterality: N/A;  . INSERTION OF DIALYSIS CATHETER Right 06/13/2016   Procedure: INSERTION OF DIALYSIS CATHETER RIGHT INTERNAL JUGULAR;  Surgeon: Conrad Brilliant, MD;  Location: [facility];  Service: Vascular;  Laterality: Right;  . INTRAOPERATIVE TRANSESOPHAGEAL ECHOCARDIOGRAM N/A 06/05/2016   Procedure: INTRAOPERATIVE TRANSESOPHAGEAL ECHOCARDIOGRAM;  Surgeon: Ivin Poot, MD;  Location: [facility];  Service: Open Heart Surgery;  Laterality: N/A;  . LEFT HEART CATH AND CORS/GRAFTS ANGIOGRAPHY N/A 11/03/2016   Procedure: Left Heart Cath and Cors/Grafts Angiography;  Surgeon: Troy Sine, MD;  Location: [place] CV LAB;  Service: Cardiovascular;  Laterality: N/A;  . LEFT HEART CATH AND CORS/GRAFTS ANGIOGRAPHY N/A 07/07/2017   Procedure: LEFT HEART CATH AND CORS/GRAFTS ANGIOGRAPHY;  Surgeon: Larey Dresser, MD;  Location: [place] CV LAB;  Service: Cardiovascular;  Laterality: N/A;        Home Medications    Prior to Admission medications   Medication Sig Start Date End Date Taking? Authorizing Provider  acetaminophen (TYLENOL) 500 MG tablet Take 500 mg by mouth every 6 (six) hours as needed for mild pain.    [provider]  aspirin 81 MG chewable tablet Chew 1 tablet (81 mg total) by mouth daily. 11/06/16   Caren Griffins, MD  atorvastatin (LIPITOR) 80 MG tablet TAKE 1 TABLET BY MOUTH ONCE DAILY 08/10/17   Larey Dresser, MD  calcium acetate (PHOSLO) 667 MG capsule Take 667 mg by mouth 3 (three) times daily with meals.  07/09/17   [provider]  citalopram (CELEXA) 20 MG tablet Take 20 mg by mouth daily. 02/25/17   [provider]  clopidogrel (PLAVIX) 75 MG tablet Take 1 tablet (75 mg total) by mouth daily. 01/12/17   Bensimhon,  Shaune Pascal, MD  ezetimibe (ZETIA) 10 MG tablet Take 1 tablet (10 mg total) by mouth daily. 02/02/17   Larey Dresser, MD  ezetimibe (ZETIA) 10 MG tablet TAKE 1 TABLET BY MOUTH ONCE DAILY 02/08/18   Larey Dresser, MD  gabapentin (NEURONTIN) 100 MG capsule Take 100 mg by mouth at bedtime.  02/11/17   [provider]  hydrALAZINE (APRESOLINE) 25 MG tablet Take 1 tablet (25 mg total) by mouth 2 (two) times daily. 12/10/17   Larey Dresser, MD  Insulin Human (INSULIN PUMP) SOLN Inject into the skin continuous. NOVOLOG    [provider]  isosorbide mononitrate (IMDUR) 60 MG 24 hr tablet TAKE 1 TABLET BY MOUTH ONCE DAILY 03/03/18   Larey Dresser, MD  losartan (COZAAR) 100 MG tablet Take 1 tablet (100 mg total) by mouth daily. 12/04/17   Charlesetta Shanks, MD  losartan (COZAAR) 50 MG tablet Take 1 tablet (50 mg total) by mouth daily. 04/14/17   Hongalgi, Lenis Dickinson, MD  metoprolol succinate (TOPROL-XL) 25 MG 24 hr tablet Take 1 tablet (25 mg total) by mouth at bedtime. 04/13/17   Hongalgi, Lenis Dickinson, MD  naproxen (  NAPROSYN) 500 MG tablet Take 1 tablet (500 mg total) by mouth 2 (two) times daily as needed for mild pain or moderate pain. Please take this medication with meals, as it can upset your stomach. 09/13/17   Thomasene Ripple, MD  pantoprazole (PROTONIX) 40 MG tablet Take 40 mg by mouth daily.  01/07/17   [provider]  Propylene Glycol (SYSTANE BALANCE) 0.6 % SOLN Place 1 drop into both eyes as needed (dry eyes).    [provider]  silver sulfADIAZINE (SILVADENE) 1 % cream Apply 1 application topically daily. 09/16/17   Wallene Huh, DPM    Family History Family History  Problem Relation Age of Onset  . CAD Father   . Colon cancer Father   . Diabetes Brother     Social History Social History   Tobacco Use  . Smoking status: Former Research scientist (life sciences)  . Smokeless tobacco: Never Used  Substance Use Topics  . Alcohol use: No    Alcohol/week: 0.0 oz  . Drug use: No      Allergies   Patient has no known allergies.   Review of Systems Review of Systems  Constitutional: Positive for fatigue. Negative for chills and fever.  HENT: Negative for rhinorrhea and sore throat.   Eyes: Negative for visual disturbance.  Respiratory: Positive for cough and shortness of breath.   Cardiovascular: Positive for chest pain. Negative for leg swelling.  Gastrointestinal: Negative for abdominal pain, nausea and vomiting.  Genitourinary: Negative for difficulty urinating and dysuria.  Musculoskeletal: Negative for back pain and neck pain.  Skin: Negative for rash and wound.  Neurological: Positive for headaches. Negative for dizziness.     Physical Exam Updated Vital Signs BP (!) 163/74   Pulse 76   Temp 98.2 F (36.8 C) (Oral)   Resp 20   Ht 5\' 6"  (1.676 m)   Wt 68 kg (150 lb)   SpO2 96%   BMI 24.21 kg/m   Physical Exam  Constitutional: He is oriented to person, place, and time. He appears well-developed and well-nourished. He appears distressed.  HENT:  Head: Normocephalic and atraumatic.  Eyes: Pupils are equal, round, and reactive to light. EOM are normal.  Neck: JVD present.  Cardiovascular: Normal rate and regular rhythm. Exam reveals no gallop and no friction rub.  No murmur heard. Pulses:      Radial pulses are 2+ on the right side, and 2+ on the left side.       Dorsalis pedis pulses are 2+ on the right side, and 2+ on the left side.       Posterior tibial pulses are 2+ on the right side, and 2+ on the left side.  Pulmonary/Chest: Effort normal. No respiratory distress. He has no wheezes. He has no rhonchi. He has no rales.  Abdominal: Soft. Bowel sounds are normal. He exhibits no distension. There is no tenderness.  Musculoskeletal:       Right lower leg: He exhibits no edema.       Left lower leg: He exhibits no edema.  Neurological: He is alert and oriented to person, place, and time.  Skin: Skin is warm and dry. Capillary refill takes  less than 2 seconds. No rash noted.  Psychiatric: He has a normal mood and affect. His behavior is normal.    ED Treatments / Results  Labs (all labs ordered are listed, but only abnormal results are displayed) Labs Reviewed  BASIC METABOLIC PANEL - Abnormal; Notable for the following components:  Result Value   Potassium 6.0 (*)    Chloride 97 (*)    Glucose, Bld 139 (*)    BUN 32 (*)    Creatinine, Ser 8.02 (*)    GFR calc non Af Amer 6 (*)    GFR calc Af Amer 7 (*)    All other components within normal limits  CBC - Abnormal; Notable for the following components:   RBC 2.93 (*)    Hemoglobin 9.2 (*)    HCT 28.6 (*)    All other components within normal limits  CBG MONITORING, ED - Abnormal; Notable for the following components:   Glucose-Capillary 132 (*)    All other components within normal limits  I-STAT TROPONIN, ED - Abnormal; Notable for the following components:   Troponin i, poc 0.11 (*)    All other components within normal limits  HEPARIN LEVEL (UNFRACTIONATED)  GLUCOSE, RANDOM  I-STAT TROPONIN, ED    EKG EKG Interpretation  Date/Time:  Wednesday March 30 2018 07:12:05 EDT Ventricular Rate:  83 PR Interval:    QRS Duration: 119 QT Interval:  418 QTC Calculation: 492 R Axis:   105 Text Interpretation:  Sinus rhythm Left posterior fascicular block Consider left ventricular hypertrophy Repol abnrm, severe global ischemia (LM/MVD) st depression in lateral and inferior leads seen on prior though more pronounced than prior Confirmed by Deno Etienne [phone]) on 03/30/2018 7:18:09 AM   Radiology Dg Chest Port 1 View  Result Date: 03/30/2018 CLINICAL DATA:  Chest pain today at dialysis EXAM: PORTABLE CHEST 1 VIEW COMPARISON:  12/04/2017 FINDINGS: Enlargement of cardiac silhouette post CABG. Pulmonary vascular congestion. Atherosclerotic calcifications aorta. Interstitial infiltrates likely pulmonary edema. Tiny RIGHT pleural effusion. Chronic RIGHT apical  scarring. Bones demineralized. IMPRESSION: Enlargement of cardiac silhouette post CABG with pulmonary vascular congestion and mild diffuse pulmonary edema. Electronically Signed   By: Lavonia Dana M.D.   On: 03/30/2018 08:07    Procedures Procedures (including critical care time)  Medications Ordered in ED Medications  heparin ADULT infusion 100 units/mL (25000 units/261mL sodium chloride 0.45%) (0 Units/hr Intravenous Stopped 03/30/18 0928)  morphine 4 MG/ML injection 2 mg ( Intravenous MAR Hold 03/30/18 0928)  acetaminophen (TYLENOL) tablet 500 mg ( Oral MAR Hold 03/30/18 0928)  aspirin chewable tablet 81 mg ( Oral Automatically Held 04/08/18 1000)  atorvastatin (LIPITOR) tablet 80 mg ( Oral Automatically Held 04/07/18 1000)  citalopram (CELEXA) tablet 20 mg ( Oral Automatically Held 04/07/18 1000)  calcium acetate (PHOSLO) capsule 667 mg ( Oral Automatically Held 04/07/18 1700)  clopidogrel (PLAVIX) tablet 75 mg ( Oral Automatically Held 04/07/18 1000)  ezetimibe (ZETIA) tablet 10 mg ( Oral Automatically Held 04/07/18 1000)  gabapentin (NEURONTIN) capsule 100 mg ( Oral Automatically Held 04/07/18 2200)  hydrALAZINE (APRESOLINE) tablet 25 mg ( Oral Automatically Held 04/07/18 2200)  metoprolol succinate (TOPROL-XL) 24 hr tablet 25 mg ( Oral Automatically Held 04/07/18 2200)  pantoprazole (PROTONIX) EC tablet 40 mg ( Oral Automatically Held 04/07/18 1000)  Chlorhexidine Gluconate Cloth 2 % PADS 6 each (has no administration in time range)  doxercalciferol (HECTOROL) injection 3 mcg (has no administration in time range)  fentaNYL (SUBLIMAZE) injection (50 mcg Intravenous Given 03/30/18 0947)  midazolam (VERSED) injection (1 mg Intravenous Given 03/30/18 0947)  lidocaine (PF) (XYLOCAINE) 1 % injection (20 mLs  Given 03/30/18 1000)  Heparin (Porcine) in NaCl 1000-0.9 UT/500ML-% SOLN (500 mLs  Given 03/30/18 1000)  heparin injection (6,000 Units Intravenous Given 03/30/18 1032)  nitroGLYCERIN 1 mg/10 ml (100  mcg/ml) - IR/CATH LAB (200 mcg Intracoronary Given 03/30/18 1045)  iohexol (OMNIPAQUE) 350 MG/ML injection (160 mLs Intra-arterial Given 03/30/18 1109)  nitroGLYCERIN 50 mg in dextrose 5 % 250 mL (0.2 mg/mL) infusion (20 mcg/min Intravenous Rate/Dose Change 03/30/18 1106)  heparin bolus via infusion 4,000 Units (4,000 Units Intravenous Bolus from Bag 03/30/18 0801)  albuterol (PROVENTIL) (2.5 MG/3ML) 0.083% nebulizer solution 10 mg (10 mg Nebulization Given 03/30/18 0912)  dextrose 50 % solution 50 mL (50 mLs Intravenous Given 03/30/18 0916)  insulin aspart (novoLOG) injection 5 Units (5 Units Intravenous Given 03/30/18 0915)     Initial Impression / Assessment and Plan / ED Course  I have reviewed the triage vital signs and the nursing notes.  Pertinent labs & imaging results that were available during my care of the patient were reviewed by me and considered in my medical decision making (see chart for details).  Kenneth [name] is a 62 y.o. male with a medical history of NSTEMI s/p CABGx3 in 2017, ESRD on HD, systolic HF, and DMII who presents from his dialysis session with chest pain. He received aspirin and nitro from EMS. Upon arrival to the hospital, patient was afebrile and hemodynamically stable with active chest pain. Physical exam was significant for mild JVD, clear lungs, and no peripheral edema. Labs were significant for hyperkalemia to 6. EKG showed ST depressions in lateral and inferior leads more pronounced than prior. CXR showed pulmonary vascular congestion and mild pulmonary edema. EKG changes were more likely the result of an ischemic process rather than hyperkalemia, but nevertheless patient was treated for hyperkalemia with albuterol, insulin, and D5. Nitro drip was started for chest pain and heparin was started for concern for NSTEMI. Discussed case with cardiology who agreed with drips and decided to cath today. Patient accepted to cardiology by Dr. Martinique.    Final Clinical  Impressions(s) / ED Diagnoses   Final diagnoses:  NSTEMI (non-ST elevated myocardial infarction) [facility])  Chest pain, unspecified type    ED Discharge Orders    None       Masin Shatto, Andree Elk, MD 03/30/18 [place], Dan, DO 03/30/18 1231

## 2018-03-30 NOTE — Interval H&P Note (Signed)
History and Physical Interval Note:  03/30/2018 9:37 AM  Kenneth [name]  has presented today for surgery, with the diagnosis of cp  The various methods of treatment have been discussed with the patient and family. After consideration of risks, benefits and other options for treatment, the patient has consented to  Procedure(s): LEFT HEART CATH AND CORONARY ANGIOGRAPHY (N/A) as a surgical intervention .  [name] history has been reviewed, patient examined, no change in status, stable for surgery.  I have reviewed the patient's chart and labs.  Questions were answered to the patient's satisfaction.   Cath Lab Visit (complete for each Cath Lab visit)  Clinical Evaluation Leading to the Procedure:   ACS: Yes.    Non-ACS:    Anginal Classification: CCS IV  Anti-ischemic medical therapy: Minimal Therapy (1 class of medications)  Non-Invasive Test Results: No non-invasive testing performed  Prior CABG: Previous CABG        Collier [facility]ina [facility] 03/30/2018 9:38 AM

## 2018-03-30 NOTE — Progress Notes (Signed)
Site area: right groin  Site Prior to Removal:  Level 0  Pressure Applied For 20 MINUTES    Minutes Beginning at 1505  Manual:   Yes.    Patient Status During Pull:  WNL  Post Pull Groin Site:  Level 0  Post Pull Instructions Given:  Yes.    Post Pull Pulses Present:  Yes.    Dressing Applied:  Yes.    Comments:  Tolerated procedure well

## 2018-03-30 NOTE — Consult Note (Addendum)
[facility] Renal Consultation Note    Indication for Consultation:  Management of ESRD/hemodialysis; anemia, hypertension/volume and secondary hyperparathyroidism  PCP:Yu, Donnie Aho, MD  HPI: Kenneth [name] is a 62 y.o. male. ESRD 2/2 DM on HD MWF at [facility], first starting in 05/2016.  Past medical history significant for DMT2, CAD s/p CABG x3 in 2017 and PCI to SVG & OM1 in 2018, A fib not on amiodarone d/t bradycardia, GERD and Hx depression.  Of note patient is compliant with prescribed dialysis regimen and has been leaving 0.5kg under his estimated dry weight for the last week.    Seen and examined at bedside post heart cath.  History obtain from both patient and his wife due to patient grogginess post cath.  Chest pain initially started about 2 months ago and has progressively worsened, especially over the last 2 days.  Last night he became SOB and did not sleep well.  He has intermittent cough, worsened over the last 2 days, productive of yellowish sputum.  Per wife he does not like the hospital and has avoided coming in.  He went to dialysis this morning and they called EMS prior to initiating dialysis due to chest pain, SOB/dyspnea and palor.  Per patient CP is now improved.  Continues to have dry cough throughout our conversation.  Denies n/v/d, palpitation, weakness, and dizziness.  Wife states his appetite has been unchanged.   Pertinent findings include elevated troponin of 0.11, EKG with worsening ST depressions, K 6.0, and CXR showing diffuse pulmonary edema and crackles on exam.  Patient was taken from ED for cardiac catheterization which showed severe 3 vessel obstructive disease.  He is being admitted for further evaluation and management.   Past Medical History:  Diagnosis Date  . Acute pulmonary edema ([place]) 05/31/2016  . Acute respiratory failure with hypoxemia ([place]) 05/31/2016  . CAD (coronary artery disease) 10/30/2016   NSTEMI 9/17 with CABG x 3 (LIMA-LAD, SVG-OM,  SVG-RCA).   - NSTEMI 10/18 s/p DES to ostial SVG to  OM  . Diabetic foot ulcer ([place]) 04/11/2017  . 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[place]) 02/06/2015  . ESRD (end stage renal disease) on dialysis [facility])    "MWF; [place]" (01/18/2017)  . Gastroesophageal reflux disease 08/18/2016  . HCAP (healthcare-associated pneumonia)   . Hemodialysis status ([place])   . Hypertensive heart disease 09/12/2017  . Ischemic rest pain of lower extremity ([place]) 02/06/2015  . 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 NSTEMI (non-ST elevated myocardial infarction) ([place])   . Pleural effusion   . Pronation deformity of both feet 02/27/2015   Past Surgical History:  Procedure Laterality Date  . AV FISTULA PLACEMENT Left 06/22/2016   Procedure: ARTERIOVENOUS (AV) FISTULA CREATION LEFT UPPER ARM;  Surgeon: Rosetta Posner, MD;  Location: [place];  Service: Vascular;  Laterality: Left;  . CARDIAC CATHETERIZATION N/A 05/31/2016   Procedure: Left Heart Cath and Coronary Angiography;  Surgeon: Jettie Booze, MD;  Location: [place] CV LAB;  Service: Cardiovascular;  Laterality: N/A;  . CARDIAC CATHETERIZATION N/A 05/31/2016   Procedure: Right Heart Cath;  Surgeon: Jettie Booze, MD;  Location: [place] CV LAB;  Service: Cardiovascular;  Laterality: N/A;  . CARDIAC CATHETERIZATION N/A 05/31/2016   Procedure: IABP Insertion;  Surgeon: Jettie Booze, MD;  Location: [place] CV LAB;  Service: Cardiovascular;  Laterality: N/A;  . CORONARY ARTERY BYPASS GRAFT N/A 06/05/2016   Procedure: CORONARY ARTERY BYPASS GRAFTING (CABG) x3 LIMA to  LAD -SVG to OM -SVG to RCA;  Surgeon: Ivin Poot, MD;  Location: [place];  Service: Open Heart Surgery;  Laterality: N/A;  . CORONARY STENT INTERVENTION N/A 07/07/2017   Procedure: CORONARY STENT INTERVENTION;  Surgeon: Wellington Hampshire, MD;  Location: [place] CV LAB;  Service: Cardiovascular;  Laterality: N/A;  . INSERTION OF DIALYSIS CATHETER N/A 06/05/2016   Procedure:  INSERTION OF DIALYSIS/trialysis CATHETER;  Surgeon: Ivin Poot, MD;  Location: [place];  Service: Vascular;  Laterality: N/A;  . INSERTION OF DIALYSIS CATHETER Right 06/13/2016   Procedure: INSERTION OF DIALYSIS CATHETER RIGHT INTERNAL JUGULAR;  Surgeon: Conrad Rodessa, MD;  Location: [place];  Service: Vascular;  Laterality: Right;  . INTRAOPERATIVE TRANSESOPHAGEAL ECHOCARDIOGRAM N/A 06/05/2016   Procedure: INTRAOPERATIVE TRANSESOPHAGEAL ECHOCARDIOGRAM;  Surgeon: Ivin Poot, MD;  Location: [place];  Service: Open Heart Surgery;  Laterality: N/A;  . LEFT HEART CATH AND CORS/GRAFTS ANGIOGRAPHY N/A 11/03/2016   Procedure: Left Heart Cath and Cors/Grafts Angiography;  Surgeon: Troy Sine, MD;  Location: [place] CV LAB;  Service: Cardiovascular;  Laterality: N/A;  . LEFT HEART CATH AND CORS/GRAFTS ANGIOGRAPHY N/A 07/07/2017   Procedure: LEFT HEART CATH AND CORS/GRAFTS ANGIOGRAPHY;  Surgeon: Larey Dresser, MD;  Location: [place] CV LAB;  Service: Cardiovascular;  Laterality: N/A;   Family History  Problem Relation Age of Onset  . CAD Father   . Colon cancer Father   . Diabetes Brother    Social History:  reports that he has quit smoking. He has never used smokeless tobacco. He reports that he does not drink alcohol or use drugs. No Known Allergies Prior to Admission medications   Medication Sig Start Date End Date Taking? Authorizing Provider  acetaminophen (TYLENOL) 500 MG tablet Take 500 mg by mouth every 6 (six) hours as needed for mild pain.    [provider]  aspirin 81 MG chewable tablet Chew 1 tablet (81 mg total) by mouth daily. 11/06/16   Caren Griffins, MD  atorvastatin (LIPITOR) 80 MG tablet TAKE 1 TABLET BY MOUTH ONCE DAILY 08/10/17   Larey Dresser, MD  calcium acetate (PHOSLO) 667 MG capsule Take 667 mg by mouth 3 (three) times daily with meals.  07/09/17   [provider]  citalopram (CELEXA) 20 MG tablet Take 20 mg by mouth daily. 02/25/17    [provider]  clopidogrel (PLAVIX) 75 MG tablet Take 1 tablet (75 mg total) by mouth daily. 01/12/17   Bensimhon, Shaune Pascal, MD  ezetimibe (ZETIA) 10 MG tablet Take 1 tablet (10 mg total) by mouth daily. 02/02/17   Larey Dresser, MD  ezetimibe (ZETIA) 10 MG tablet TAKE 1 TABLET BY MOUTH ONCE DAILY 02/08/18   Larey Dresser, MD  gabapentin (NEURONTIN) 100 MG capsule Take 100 mg by mouth at bedtime.  02/11/17   [provider]  hydrALAZINE (APRESOLINE) 25 MG tablet Take 1 tablet (25 mg total) by mouth 2 (two) times daily. 12/10/17   Larey Dresser, MD  Insulin Human (INSULIN PUMP) SOLN Inject into the skin continuous. NOVOLOG    [provider]  isosorbide mononitrate (IMDUR) 60 MG 24 hr tablet TAKE 1 TABLET BY MOUTH ONCE DAILY 03/03/18   Larey Dresser, MD  losartan (COZAAR) 100 MG tablet Take 1 tablet (100 mg total) by mouth daily. 12/04/17   Charlesetta Shanks, MD  losartan (COZAAR) 50 MG tablet Take 1 tablet (50 mg total) by mouth daily. 04/14/17  Modena Jansky, MD  metoprolol succinate (TOPROL-XL) 25 MG 24 hr tablet Take 1 tablet (25 mg total) by mouth at bedtime. 04/13/17   Hongalgi, Lenis Dickinson, MD  naproxen (NAPROSYN) 500 MG tablet Take 1 tablet (500 mg total) by mouth 2 (two) times daily as needed for mild pain or moderate pain. Please take this medication with meals, as it can upset your stomach. 09/13/17   Thomasene Ripple, MD  pantoprazole (PROTONIX) 40 MG tablet Take 40 mg by mouth daily.  01/07/17   [provider]  Propylene Glycol (SYSTANE BALANCE) 0.6 % SOLN Place 1 drop into both eyes as needed (dry eyes).    [provider]  silver sulfADIAZINE (SILVADENE) 1 % cream Apply 1 application topically daily. 09/16/17   Wallene Huh, DPM   Current Facility-Administered Medications  Medication Dose Route Frequency Provider Last Rate Last Dose  . 0.9 %  sodium chloride infusion  250 mL Intravenous PRN Martinique, Peter M, MD      . acetaminophen  (TYLENOL) tablet 650 mg  650 mg Oral Q4H PRN Martinique, Peter M, MD      . Derrill Memo ON 03/31/2018] aspirin chewable tablet 81 mg  81 mg Oral Daily Martinique, Peter M, MD      . atorvastatin (LIPITOR) tablet 80 mg  80 mg Oral Daily Martinique, Peter M, MD      . calcium acetate (PHOSLO) capsule 667 mg  667 mg Oral TID WC Martinique, Peter M, MD      . Chlorhexidine Gluconate Cloth 2 % PADS 6 each  6 each Topical Q0600 Martinique, Peter M, MD      . citalopram (CELEXA) tablet 20 mg  20 mg Oral Daily Martinique, Peter M, MD      . clopidogrel (PLAVIX) tablet 75 mg  75 mg Oral Daily Martinique, Peter M, MD      . doxercalciferol (HECTOROL) injection 3 mcg  3 mcg Intravenous Q M,W,F-HD Martinique, Peter M, MD      . ezetimibe (ZETIA) tablet 10 mg  10 mg Oral Daily Martinique, Peter M, MD      . gabapentin (NEURONTIN) capsule 100 mg  100 mg Oral QHS Martinique, Peter M, MD      . Derrill Memo ON 03/31/2018] heparin injection 5,000 Units  5,000 Units Subcutaneous Q8H Martinique, Peter M, MD      . hydrALAZINE (APRESOLINE) injection 5 mg  5 mg Intravenous Q20 Min PRN Martinique, Peter M, MD      . hydrALAZINE (APRESOLINE) tablet 25 mg  25 mg Oral BID Martinique, Peter M, MD      . labetalol (NORMODYNE,TRANDATE) injection 10 mg  10 mg Intravenous Q10 min PRN Martinique, Peter M, MD      . metoprolol succinate (TOPROL-XL) 24 hr tablet 25 mg  25 mg Oral QHS Martinique, Peter M, MD      . morphine 4 MG/ML injection 2 mg  2 mg Intravenous Once Martinique, Peter M, MD      . ondansetron [facility]) injection 4 mg  4 mg Intravenous Q6H PRN Martinique, Peter M, MD      . pantoprazole (PROTONIX) EC tablet 40 mg  40 mg Oral Daily Martinique, Peter M, MD      . sodium chloride flush (NS) 0.9 % injection 3 mL  3 mL Intravenous Q12H Martinique, Peter M, MD      . sodium chloride flush (NS) 0.9 % injection 3 mL  3 mL Intravenous PRN [place], Peter M, MD  Labs: Basic Metabolic Panel: Recent Labs  Lab 03/30/18 0719 03/30/18 1206  NA 137  --   K 6.0*  --   CL 97*  --   CO2 28  --   GLUCOSE  139* 155*  BUN 32*  --   CREATININE 8.02*  --   CALCIUM 9.3  --    CBC: Recent Labs  Lab 03/30/18 0719 03/30/18 1206  WBC 8.7 6.9  HGB 9.2* 8.6*  HCT 28.6* 26.0*  MCV 97.6 96.3  PLT 150 130*   CBG: Recent Labs  Lab 03/30/18 0733 03/30/18 1148  GLUCAP 132* 147*   Studies/Results: Dg Chest Port 1 View  Result Date: 03/30/2018 CLINICAL DATA:  Chest pain today at dialysis EXAM: PORTABLE CHEST 1 VIEW COMPARISON:  12/04/2017 FINDINGS: Enlargement of cardiac silhouette post CABG. Pulmonary vascular congestion. Atherosclerotic calcifications aorta. Interstitial infiltrates likely pulmonary edema. Tiny RIGHT pleural effusion. Chronic RIGHT apical scarring. Bones demineralized. IMPRESSION: Enlargement of cardiac silhouette post CABG with pulmonary vascular congestion and mild diffuse pulmonary edema. Electronically Signed   By: Lavonia Dana M.D.   On: 03/30/2018 08:07    ROS: All others negative except those listed in HPI.  Physical Exam: Vitals:   03/30/18 1107 03/30/18 1112 03/30/18 1117 03/30/18 1149  BP: (!) 154/81 (!) 152/78 (!) 146/78 (!) 146/62  Pulse: 79 76 79 75  Resp: 14 18 13 15   Temp:    98.1 F (36.7 C)  TempSrc:    Oral  SpO2: 94% 93% 92% 95%  Weight:      Height:         General: WDWN, NAD, appears younger than stated age Head: NCAT, sclera not icteric, MMM Neck: Supple. No lymphadenopathy Lungs: +scattered crackles b/l.  No wheezes or rhonchi. Breathing is unlabored on 3L O2 via [place]. Heart: RRR. No murmur, rubs or gallops appreciated.  Abdomen: soft, nontender, +BS, no guarding, no rebound tenderness Lower extremities:no edema, ischemic changes, or open wounds  Neuro: AAOx3. Moves all extremities spontaneously. Psych:  Responds to questions appropriately with a normal affect. Dialysis Access: LU AVF +b/t  Dialysis Orders:  MWF - [facility] (AF)  3.75hrs, BFR 400, DFR 800,  EDW 66.5kg, 2K/ 2.25Ca  Access: LU AVF  Heparin 4500 Unit bolus Mircera 50 mcg q2wks  - last 03/28/18 Hectorol 3 mcg IV qHD   Assessment/Plan: 1. NSTEMI w/known Hx CAD s/p CABG in 2017 & PCI to SVG-OM1- troponin elevated w/increased ST depression.  Cardio consulted and went for [facility] showing severe 3 vessel obstructive CAD s/p PCI this AM.  ECHO ordered.  Per cards. 2. Pulmonary edema - CXR shows pulmonary edema.  Plan for HD post LHC for excess volume removal.  3. Hyperkalemia - K 6.0 this am.  Rechecking K post procedure. Plan for HD ASAP.  4.  ESRD -  MWF HD.  Today is his dialysis day.  Orders written for HD post LHC.  Holding heparin today due to procedure.  K elevated, plan for 2K bath unless repeat K^.   5.  Hypertension/volume  - BP elevated.  Pulmonary edema noted on CXR.  If weights correct only 1.5kg over EDW.  Likely recent weight loss as he has been getting under his EDW.  Plan for UF goal 2.5-3L as tolerated.  6.  Anemia of CKD - Hgb 9.2>8.6. ESA recently dosed. Follow trends. 7.  Secondary Hyperparathyroidism -  Ca 9.3, Phos at goal as OP.  Continue VDRA,  8.  Nutrition - Renal diet with  fluid restriction. Renavite.  9. A fib - per primary 10. DMT2 - on insulin  Jen Mow, PA-C [facility] Pager: [phone] 03/30/2018, 12:39 PM

## 2018-03-30 NOTE — ED Triage Notes (Signed)
Pt arrived  Via [facility]; pt from dialysis cntr with c/o CP; pt did not complete dialysis today, upon arrival stated that he had CP and EMS called; pt rec'd 324mg  ASA; and 2 nitro; 168/90, 78, 98% on @L  O2..the patient does not normally wear O2; and CBG 172

## 2018-03-31 ENCOUNTER — Inpatient Hospital Stay (HOSPITAL_COMMUNITY): Payer: Medicare Other

## 2018-03-31 ENCOUNTER — Other Ambulatory Visit (HOSPITAL_COMMUNITY): Payer: Medicare Other

## 2018-03-31 DIAGNOSIS — I480 Paroxysmal atrial fibrillation: Secondary | ICD-10-CM

## 2018-03-31 DIAGNOSIS — I361 Nonrheumatic tricuspid (valve) insufficiency: Secondary | ICD-10-CM

## 2018-03-31 DIAGNOSIS — R0602 Shortness of breath: Secondary | ICD-10-CM

## 2018-03-31 DIAGNOSIS — I214 Non-ST elevation (NSTEMI) myocardial infarction: Principal | ICD-10-CM

## 2018-03-31 DIAGNOSIS — N186 End stage renal disease: Secondary | ICD-10-CM

## 2018-03-31 LAB — CBC
HCT: 29.5 % — ABNORMAL LOW (ref 39.0–52.0)
Hemoglobin: 9.7 g/dL — ABNORMAL LOW (ref 13.0–17.0)
MCH: 31.7 pg (ref 26.0–34.0)
MCHC: 32.9 g/dL (ref 30.0–36.0)
MCV: 96.4 fL (ref 78.0–100.0)
Platelets: 151 10*3/uL (ref 150–400)
RBC: 3.06 MIL/uL — ABNORMAL LOW (ref 4.22–5.81)
RDW: 14.5 % (ref 11.5–15.5)
WBC: 9.3 10*3/uL (ref 4.0–10.5)

## 2018-03-31 LAB — BASIC METABOLIC PANEL
ANION GAP: 13 (ref 5–15)
BUN: 41 mg/dL — ABNORMAL HIGH (ref 6–20)
CALCIUM: 9.1 mg/dL (ref 8.9–10.3)
CHLORIDE: 94 mmol/L — AB (ref 98–111)
CO2: 28 mmol/L (ref 22–32)
Creatinine, Ser: 9.59 mg/dL — ABNORMAL HIGH (ref 0.61–1.24)
GFR calc non Af Amer: 5 mL/min — ABNORMAL LOW (ref >60)
GFR, EST AFRICAN AMERICAN: 6 mL/min — AB (ref >60)
GLUCOSE: 156 mg/dL — AB (ref 70–99)
Potassium: 6.4 mmol/L (ref 3.5–5.1)
Sodium: 135 mmol/L (ref 135–145)

## 2018-03-31 LAB — GLUCOSE, CAPILLARY
Glucose-Capillary: 87 mg/dL (ref 70–99)
Glucose-Capillary: 109 mg/dL — ABNORMAL HIGH (ref 70–99)
Glucose-Capillary: 271 mg/dL — ABNORMAL HIGH (ref 70–99)
Glucose-Capillary: 79 mg/dL (ref 70–99)
Glucose-Capillary: 186 mg/dL — ABNORMAL HIGH (ref 70–99)

## 2018-03-31 LAB — ECHOCARDIOGRAM COMPLETE
Height: 66 in
Weight: 2398.6 oz

## 2018-03-31 LAB — TROPONIN I: Troponin I: 1.13 ng/mL (ref <0.03)

## 2018-03-31 MED ORDER — SODIUM CHLORIDE 0.9 % IV SOLN
1.0000 g | Freq: Once | INTRAVENOUS | Status: AC
  Administered 2018-03-31: 03:00:00 1 g via INTRAVENOUS
  Filled 2018-03-31: qty 10

## 2018-03-31 MED ORDER — DOXERCALCIFEROL 4 MCG/2ML IV SOLN
INTRAVENOUS | Status: AC
  Filled 2018-03-31: qty 2

## 2018-03-31 MED ORDER — DEXTROSE 50 % IV SOLN
25.0000 g | Freq: Once | INTRAVENOUS | Status: AC
  Administered 2018-03-31: 03:00:00 25 g via INTRAVENOUS
  Filled 2018-03-31: qty 50

## 2018-03-31 MED ORDER — SODIUM BICARBONATE 8.4 % IV SOLN
50.0000 meq | Freq: Once | INTRAVENOUS | Status: AC
  Administered 2018-03-31: 03:00:00 50 meq via INTRAVENOUS
  Filled 2018-03-31: qty 50

## 2018-03-31 MED ORDER — INSULIN ASPART 100 UNIT/ML IV SOLN
10.0000 [IU] | Freq: Once | INTRAVENOUS | Status: AC
  Administered 2018-03-31: 10 [IU] via INTRAVENOUS

## 2018-03-31 MED ORDER — TRAZODONE HCL 50 MG PO TABS
25.0000 mg | ORAL_TABLET | ORAL | Status: AC
  Administered 2018-03-31: 25 mg via ORAL
  Filled 2018-03-31: qty 1

## 2018-03-31 NOTE — Progress Notes (Signed)
  Echocardiogram 2D Echocardiogram has been performed.  Jennette Dubin 03/31/2018, 3:19 PM

## 2018-03-31 NOTE — Progress Notes (Signed)
Called primary nurse to make sure she was aware that Dr. Hollie Salk had modified HD tx orders to change from 7/17 to 1st shift 7/18.

## 2018-03-31 NOTE — Progress Notes (Signed)
Progress Note  Patient Name: Kenneth [name] Date of Encounter: 03/31/2018  Primary Cardiologist: No primary care provider on file. Dr. Aundra Dubin  Subjective   CP better.  Still short of breath.  Getting dialysis.  Inpatient Medications    Scheduled Meds: . aspirin  81 mg Oral Daily  . atorvastatin  80 mg Oral Daily  . calcium acetate  667 mg Oral TID WC  . citalopram  20 mg Oral Daily  . clopidogrel  75 mg Oral Daily  . doxercalciferol  3 mcg Intravenous Q M,W,F-HD  . ezetimibe  10 mg Oral Daily  . feeding supplement (NEPRO CARB STEADY)  237 mL Oral BID BM  . gabapentin  100 mg Oral QHS  . heparin  5,000 Units Subcutaneous Q8H  . hydrALAZINE  25 mg Oral BID  . insulin aspart  0-15 Units Subcutaneous TID WC  . metoprolol succinate  25 mg Oral QHS  . pantoprazole  40 mg Oral Daily  . sodium chloride flush  3 mL Intravenous Q12H   Continuous Infusions: . sodium chloride     PRN Meds: sodium chloride, acetaminophen, guaiFENesin, ondansetron (ZOFRAN) IV, sodium chloride flush   Vital Signs    Vitals:   03/31/18 0731 03/31/18 0800 03/31/18 0830 03/31/18 0900  BP: (!) 150/77 (!) 154/77 135/62 (!) 114/57  Pulse: 70 70 76 65  Resp: 20 20 20 16   Temp:      TempSrc:      SpO2: 94% 95% 95%   Weight:      Height:        Intake/Output Summary (Last 24 hours) at 03/31/2018 0925 Last data filed at 03/30/2018 1958 Gross per 24 hour  Intake 240 ml  Output -  Net 240 ml   Filed Weights   03/30/18 0718 03/31/18 0106 03/31/18 0720  Weight: 150 lb (68 kg) 155 lb 13.8 oz (70.7 kg) 149 lb 14.6 oz (68 kg)    Telemetry    PAF - Personally Reviewed  ECG    Episode of AFib with controlled rate - Personally Reviewed  Physical Exam   GEN: No acute distress.   Neck: No JVD Cardiac: RRR, no murmurs, rubs, or gallops.  Respiratory: Clear to auscultation bilaterally. GI: Soft, nontender, non-distended  MS: No edema; No deformity. Right groin without hematoma Neuro:  Nonfocal    Psych: Normal affect   Labs    Chemistry Recent Labs  Lab 03/30/18 0719 03/30/18 1206 03/31/18 0054  NA 137  --  135  K 6.0* 5.1 6.4*  CL 97*  --  94*  CO2 28  --  28  GLUCOSE 139* 155* 156*  BUN 32*  --  41*  CREATININE 8.02* 8.29* 9.59*  CALCIUM 9.3  --  9.1  GFRNONAA 6* 6* 5*  GFRAA 7* 7* 6*  ANIONGAP 12  --  13     Hematology Recent Labs  Lab 03/30/18 0719 03/30/18 1206 03/31/18 0054  WBC 8.7 6.9 9.3  RBC 2.93* 2.70* 3.06*  HGB 9.2* 8.6* 9.7*  HCT 28.6* 26.0* 29.5*  MCV 97.6 96.3 96.4  MCH 31.4 31.9 31.7  MCHC 32.2 33.1 32.9  RDW 14.3 14.5 14.5  PLT 150 130* 151    Cardiac Enzymes Recent Labs  Lab 03/30/18 1206 03/30/18 1841 03/31/18 0054  TROPONINI 0.11* 0.36* 1.13*    Recent Labs  Lab 03/30/18 0733  TROPIPOC 0.11*     BNPNo results for input(s): BNP, PROBNP in the last 168 hours.  DDimer No results for input(s): DDIMER in the last 168 hours.   Radiology    Dg Chest Port 1 View  Result Date: 03/30/2018 CLINICAL DATA:  Chest pain today at dialysis EXAM: PORTABLE CHEST 1 VIEW COMPARISON:  12/04/2017 FINDINGS: Enlargement of cardiac silhouette post CABG. Pulmonary vascular congestion. Atherosclerotic calcifications aorta. Interstitial infiltrates likely pulmonary edema. Tiny RIGHT pleural effusion. Chronic RIGHT apical scarring. Bones demineralized. IMPRESSION: Enlargement of cardiac silhouette post CABG with pulmonary vascular congestion and mild diffuse pulmonary edema. Electronically Signed   By: Lavonia Dana M.D.   On: 03/30/2018 08:07    Cardiac Studies   Cath films reviewed  Patient Profile     62 y.o. male with CAD, NSTEMI  Assessment & Plan    1) CAD: Needs DAPT for at least 30 days. Secondary prevention as well.  DES to native OM as SVG has had restenosis.  2) PAF: short episode of AFib last night.  In the setting of pulmonary edema, electrolyte imbalance and delayed dialysis, may not want to commit to longterm anticoagulation.   Will see if [facility] improves today after dialysis.  Watch on tele and can make decision about longterm anticoagulation.  For questions or updates, please contact [place] Please consult [email or web address] for contact info under Cardiology/STEMI.      Signed, Larae Grooms, MD  03/31/2018, 9:25 AM

## 2018-03-31 NOTE — Procedures (Signed)
I was present at this dialysis session. I have reviewed the session itself and made appropriate changes.   K 6.4, 2K bath will treat. Standing weight 68kg EDW 66.5kg, UF goal 2.5L probing below EDW today.  Has some cough and dysnpea.  Monitor with HD.  SpO2 has been low 90s on 3L overnight.  CXR had pulmonary edema pre LHC.    Filed Weights   03/30/18 0718 03/31/18 0106  Weight: 68 kg (150 lb) 70.7 kg (155 lb 13.8 oz)    Recent Labs  Lab 03/31/18 0054  NA 135  K 6.4*  CL 94*  CO2 28  GLUCOSE 156*  BUN 41*  CREATININE 9.59*  CALCIUM 9.1    Recent Labs  Lab 03/30/18 0719 03/30/18 1206 03/31/18 0054  WBC 8.7 6.9 9.3  HGB 9.2* 8.6* 9.7*  HCT 28.6* 26.0* 29.5*  MCV 97.6 96.3 96.4  PLT 150 130* 151    Scheduled Meds: . aspirin  81 mg Oral Daily  . atorvastatin  80 mg Oral Daily  . calcium acetate  667 mg Oral TID WC  . citalopram  20 mg Oral Daily  . clopidogrel  75 mg Oral Daily  . doxercalciferol  3 mcg Intravenous Q M,W,F-HD  . ezetimibe  10 mg Oral Daily  . feeding supplement (NEPRO CARB STEADY)  237 mL Oral BID BM  . gabapentin  100 mg Oral QHS  . heparin  5,000 Units Subcutaneous Q8H  . hydrALAZINE  25 mg Oral BID  . insulin aspart  0-15 Units Subcutaneous TID WC  . metoprolol succinate  25 mg Oral QHS  . pantoprazole  40 mg Oral Daily  . sodium chloride flush  3 mL Intravenous Q12H   Continuous Infusions: . sodium chloride     PRN Meds:.sodium chloride, acetaminophen, guaiFENesin, ondansetron (ZOFRAN) IV, sodium chloride flush   Pearson Grippe  MD 03/31/2018, 8:07 AM

## 2018-03-31 NOTE — Progress Notes (Signed)
CRITICAL VALUE ALERT  Critical Value:  K=6.4  Date & Time Notied: 10/01/17 (01:30 am)  Provider Notified: Dr [place] Lions  Orders Received/Actions taken: Yes Dr [place] Lions also informed of pt's new onset A.fib @ 01:42 am confirmed EKG. Pt's rhythm back to SB/ NSR at 2:43 am. Will continue to monitor pt.

## 2018-03-31 NOTE — Progress Notes (Signed)
CARDIAC REHAB PHASE I   Attempted to see pt x3. Pt has been educated by cardiac rehab in 10/17 and 10/18. Will refer to CRP [facility] [place] as pt had participated in the program previously. Brochure left at bedside.  [id number] Rufina Falco, RN BSN 03/31/2018 2:40 PM

## 2018-04-01 ENCOUNTER — Inpatient Hospital Stay (HOSPITAL_COMMUNITY): Payer: Medicare Other

## 2018-04-01 DIAGNOSIS — I5033 Acute on chronic diastolic (congestive) heart failure: Secondary | ICD-10-CM

## 2018-04-01 DIAGNOSIS — R531 Weakness: Secondary | ICD-10-CM

## 2018-04-01 LAB — GLUCOSE, CAPILLARY
Glucose-Capillary: 258 mg/dL — ABNORMAL HIGH (ref 70–99)
Glucose-Capillary: 94 mg/dL (ref 70–99)
Glucose-Capillary: 110 mg/dL — ABNORMAL HIGH (ref 70–99)
Glucose-Capillary: 177 mg/dL — ABNORMAL HIGH (ref 70–99)
Glucose-Capillary: 147 mg/dL — ABNORMAL HIGH (ref 70–99)
Glucose-Capillary: 257 mg/dL — ABNORMAL HIGH (ref 70–99)

## 2018-04-01 LAB — BASIC METABOLIC PANEL
Anion gap: 10 (ref 5–15)
BUN: 24 mg/dL — ABNORMAL HIGH (ref 6–20)
CALCIUM: 9 mg/dL (ref 8.9–10.3)
CHLORIDE: 93 mmol/L — AB (ref 98–111)
CO2: 31 mmol/L (ref 22–32)
CREATININE: 6.77 mg/dL — AB (ref 0.61–1.24)
GFR, EST AFRICAN AMERICAN: 9 mL/min — AB (ref >60)
GFR, EST NON AFRICAN AMERICAN: 8 mL/min — AB (ref >60)
Glucose, Bld: 241 mg/dL — ABNORMAL HIGH (ref 70–99)
Potassium: 5.3 mmol/L — ABNORMAL HIGH (ref 3.5–5.1)
SODIUM: 134 mmol/L — AB (ref 135–145)

## 2018-04-01 LAB — NA AND K (SODIUM & POTASSIUM), RAND UR
Potassium Urine: 82 mmol/L
Sodium, Ur: 32 mmol/L

## 2018-04-01 MED ORDER — IOPAMIDOL (ISOVUE-370) INJECTION 76%
50.0000 mL | Freq: Once | INTRAVENOUS | Status: AC | PRN
  Administered 2018-04-01: 50 mL via INTRAVENOUS

## 2018-04-01 MED ORDER — IOPAMIDOL (ISOVUE-370) INJECTION 76%
INTRAVENOUS | Status: AC
  Filled 2018-04-01: qty 100

## 2018-04-01 MED ORDER — SODIUM CHLORIDE 0.9 % IV SOLN
INTRAVENOUS | Status: DC
  Administered 2018-04-01: 10:00:00 via INTRAVENOUS

## 2018-04-01 MED ORDER — CHLORHEXIDINE GLUCONATE CLOTH 2 % EX PADS
6.0000 | MEDICATED_PAD | Freq: Every day | CUTANEOUS | Status: DC
  Administered 2018-04-02 – 2018-04-03 (×2): 6 via TOPICAL

## 2018-04-01 MED ORDER — APIXABAN 5 MG PO TABS
5.0000 mg | ORAL_TABLET | Freq: Two times a day (BID) | ORAL | Status: DC
  Administered 2018-04-01 – 2018-04-03 (×4): 5 mg via ORAL
  Filled 2018-04-01 (×5): qty 1

## 2018-04-01 NOTE — Progress Notes (Addendum)
Progress Note  Patient Name: JAMONTAE THWAITES Date of Encounter: 04/01/2018  Primary Cardiologist: No primary care provider on file. Dr. Aundra Dubin  Subjective   Had episode of weakness and AMS after walking with cardiac rehab this am. Blood glucose WNL at 94 this am (Though was 241 earlier this am so drop may have contributed).  ? Left sided weakness with difficulty walking back to room requiring wheel chair.  He has since drifted in and out of responsiveness, with questionable left sided sensory deficits per rapid response and ? Mild left sided weakness. Code stroke initiated and plan for Head CT. He complains of an occipital HA by his hand placement, but only intermittently answers questions (Interpreter engaged via in-room tablet)  Had planned for  HD today. May need to defer based on neuro work up.   Inpatient Medications    Scheduled Meds: . aspirin  81 mg Oral Daily  . atorvastatin  80 mg Oral Daily  . calcium acetate  667 mg Oral TID WC  . Chlorhexidine Gluconate Cloth  6 each Topical Q0600  . citalopram  20 mg Oral Daily  . clopidogrel  75 mg Oral Daily  . doxercalciferol  3 mcg Intravenous Q M,W,F-HD  . ezetimibe  10 mg Oral Daily  . feeding supplement (NEPRO CARB STEADY)  237 mL Oral BID BM  . gabapentin  100 mg Oral QHS  . heparin  5,000 Units Subcutaneous Q8H  . hydrALAZINE  25 mg Oral BID  . insulin aspart  0-15 Units Subcutaneous TID WC  . metoprolol succinate  25 mg Oral QHS  . pantoprazole  40 mg Oral Daily  . sodium chloride flush  3 mL Intravenous Q12H   Continuous Infusions: . sodium chloride     PRN Meds: sodium chloride, acetaminophen, guaiFENesin, ondansetron (ZOFRAN) IV, sodium chloride flush   Vital Signs    Vitals:   03/31/18 2206 03/31/18 2209 04/01/18 0342 04/01/18 0808  BP: (!) 147/63  (!) 148/42   Pulse:  70 66   Resp:   18   Temp:   98.7 F (37.1 C) 98 F (36.7 C)  TempSrc:   Oral Oral  SpO2:   92% 98%  Weight:   151 lb 10.8 oz (68.8 kg)    Height:        Intake/Output Summary (Last 24 hours) at 04/01/2018 0852 Last data filed at 04/01/2018 0700 Gross per 24 hour  Intake 357 ml  Output 2500 ml  Net -2143 ml   Filed Weights   03/31/18 0720 03/31/18 1116 04/01/18 0342  Weight: 149 lb 14.6 oz (68 kg) 144 lb 6.4 oz (65.5 kg) 151 lb 10.8 oz (68.8 kg)    Telemetry    NSR 60-70s, personally reviewed.   ECG    No new tracings.    Physical Exam   General: Fatigued appearing.  HEENT: Normal.  Neck: Supple. JVP 5-6. Carotids 2+ bilat; no bruits. No thyromegaly or nodule noted. Cor: PMI nondisplaced. RRR, No M/G/R noted Lungs: CTAB, normal effort. Abdomen: Soft, non-tender, non-distended, no HSM. No bruits or masses. +BS  Extremities: No cyanosis, clubbing, or rash. R  Neuro: Alert & orientedx3, cranial nerves grossly intact. moves all 4 extremities w/o difficulty. Affect pleasant   Labs    Chemistry Recent Labs  Lab 03/30/18 0719 03/30/18 1206 03/31/18 0054 04/01/18 0330  NA 137  --  135 134*  K 6.0* 5.1 6.4* 5.3*  CL 97*  --  94* 93*  CO2 28  --  28 31  GLUCOSE 139* 155* 156* 241*  BUN 32*  --  41* 24*  CREATININE 8.02* 8.29* 9.59* 6.77*  CALCIUM 9.3  --  9.1 9.0  GFRNONAA 6* 6* 5* 8*  GFRAA 7* 7* 6* 9*  ANIONGAP 12  --  13 10     Hematology Recent Labs  Lab 03/30/18 0719 03/30/18 1206 03/31/18 0054  WBC 8.7 6.9 9.3  RBC 2.93* 2.70* 3.06*  HGB 9.2* 8.6* 9.7*  HCT 28.6* 26.0* 29.5*  MCV 97.6 96.3 96.4  MCH 31.4 31.9 31.7  MCHC 32.2 33.1 32.9  RDW 14.3 14.5 14.5  PLT 150 130* 151    Cardiac Enzymes Recent Labs  Lab 03/30/18 1206 03/30/18 1841 03/31/18 0054  TROPONINI 0.11* 0.36* 1.13*    Recent Labs  Lab 03/30/18 0733  TROPIPOC 0.11*     BNPNo results for input(s): BNP, PROBNP in the last 168 hours.   DDimer No results for input(s): DDIMER in the last 168 hours.   Radiology    No results found.  Cardiac Studies   Cath films reviewed  Patient Profile     NICKOLES GREGORI is a 62 y.o. male with history of type II diabetes, CKD stage IV requiring HD for a period of time, CAD, and chronic systolic  Admitted with NSTEMI.   Assessment & Plan    1) CAD:  - s/p PCI with DES x 2 to native OM1.  - No s/s of ischemia.    - Needs DAPT for at least 30 days. Secondary prevention as well.  DES to native OM as SVG has had restenosis.  2) PAF:  - short episode of AFib overnight 03/30/18.  In the setting of pulmonary edema, electrolyte imbalance and delayed dialysis, and short nature, No longterm anticoagulation initiated.  - ? If was significant enough to cause/throw clot, or if has history of PAF previously undetected.  - May need long term anticoagulation. This patients CHA2DS2-VASc is at least 4.   3) AMS - Code Stroke called. Further work up per Neurology pending.  - Pending Head CT.   4) ESRD - Had planned for HD today. May need to defer pending neuro work up.   Shirley Friar, PA-C  04/01/18 9:10 AM   Advanced Heart Failure Team Pager [phone] (M-F; 7a - 4p)  Please contact [facility] for night-coverage after hours (4p -7a ) and weekends on [email or web address]  Patient seen with PA, agree with the above note.  This morning developed left-sided weakness and altered mental status. CTA head showed occluded right vertebral and right PICA, but per neuro this probably does not explain findings.  MRI head did not show CVA.  ?TIA versus stroke missed on MRI.   Currently, still seems confused though there is a language barrier.  He is short of breath (due for HD today).   On exam, JVP 10-12 cm.  Reg S1S2 with 2/6 SEM RUSB.  No edema.  Still with left-sided weakness.   1. CAD: S/p NSTEMI.  Severe in-stent restenosis in SVG-OM was culprit, now s/p DES x 2 to native OM1.   - Continue ASA 81 and Plavix for now.  Ultimately, given afib seen this admission and TIA vs stroke, he needs to be anticoagulated.  I think it would be reasonable to stop ASA 81 and start  apixaban 5 mg bid and continue Plavix.  Will need to discuss with neurology regarding timing.  - Continue Zetia and atorvastatin.  2. Acute on  chronic diastolic CHF: He is volume overloaded today and appears short of breath.  Echo with EF 50% yesterday.  - I think he needs HD for volume removal today, hopefully nephrology will arrange this soon.  3. ESRD: Needs HD today for volume control.  4. Atrial fibrillation: Noted post-op CABG and again for about an hour this admission.  Based on TIA versus CVA today, he will need anticoagulation.  Would favor stopping ASA and continuing Plavix + Eliquis 5 mg bid but will need to discuss timing of Eliquis initiation with neurology.  5. Neuro: Small CVA missed by MRI versus TIA. CTA head/neck with occluded right PICA and vertebral, but these do not explain symptoms.  MRI head does not show CVA.  - Per neuro.  Loralie Champagne 04/01/2018 1:24 PM

## 2018-04-01 NOTE — Consult Note (Addendum)
Neurology Consultation  Reason for Consult: Code Stroke Referring Physician: Left sided weakness  CC: Weakness-left side  History is obtained from: patient and chart.  Video interpreter was used as patient speaks some [place] and mainly Mandarin.  HPI: Kenneth [name] is a 62 y.o. 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 The patient has been complaining of headaches that have been going on and off for past 2 months.  He is also complaining of increasing shortness of breath at this time. He denies any visual changes.  He reports continuing headache at this time. He denies chest pain or discomfort.  He also reports of increasing cough. He has not had a stroke in the past. Of note patient had a short episode of atrial fibrillation overnight that resolved   LKW: 0800 hrs tpa given?: no, low stroke scale, MRI negative for stroke Premorbid modified Rankin scale (mRS): 1   VVO:HYWVPX to obtain due to altered mental status.   Past Medical History:  Diagnosis Date  . Acute pulmonary edema ([place]) 05/31/2016  . Acute respiratory failure with hypoxemia ([place]) 05/31/2016  . CAD (coronary artery disease) 10/30/2016   NSTEMI 9/17 with CABG x 3 (LIMA-LAD, SVG-OM, SVG-RCA).   - NSTEMI 10/18 s/p DES to ostial SVG to  OM  . Diabetic foot ulcer ([place]) 04/11/2017  . 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 ESRD (end stage renal disease) on dialysis [facility])    "MWF; [place]" (03/30/2018)  . Gastroesophageal reflux disease 08/18/2016  .  HCAP (healthcare-associated pneumonia)   . Hemodialysis status ([place])   . Hypertensive heart disease 09/12/2017  . Ischemic rest pain of lower extremity ([place]) 02/06/2015  . 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 NSTEMI (non-ST elevated myocardial infarction) ([place])   . Pleural effusion   . 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 CAD Father   . Colon cancer Father   . Diabetes Brother     Social History:   reports that he quit smoking about 5 years ago. His smoking use included cigarettes. He has a 52.50 pack-year smoking history. He has never used smokeless tobacco. He reports that he does not drink alcohol or use drugs.  Medications  Current Facility-Administered Medications:  .  0.9 %  sodium chloride infusion, 250 mL, Intravenous, PRN, [place], Peter M, MD .  0.9 %  sodium chloride infusion, , Intravenous, Continuous, Amie Portland, MD .  acetaminophen (TYLENOL) tablet 650 mg, 650 mg, Oral, Q4H PRN, [place], Peter M, MD .  aspirin chewable tablet 81 mg, 81 mg, Oral, Daily, [place], Peter M, MD, 81 mg at 03/31/18 1758 .  atorvastatin (LIPITOR) tablet 80 mg, 80 mg, Oral, Daily, [place], Peter M, MD, 80 mg at 03/31/18 1314 .  calcium acetate (PHOSLO) capsule 667 mg, 667 mg, Oral, TID WC, [place], Peter M, MD, 667 mg at 03/31/18 1758 .  Chlorhexidine Gluconate Cloth 2 % PADS 6 each, 6 each, Topical, Q0600, Loren Racer, PA-C .  citalopram (  CELEXA) tablet 20 mg, 20 mg, Oral, Daily, [place], Peter M, MD, 20 mg at 03/31/18 1314 .  clopidogrel (PLAVIX) tablet 75 mg, 75 mg, Oral, Daily, Jettie Booze, MD, 75 mg at 03/31/18 1313 .  doxercalciferol (HECTOROL) injection 3 mcg, 3 mcg, Intravenous, Q M,W,F-HD, [place], Peter M, MD, 3 mcg at 03/31/18 1020 .  ezetimibe (ZETIA) tablet 10 mg, 10 mg, Oral, Daily, [place], Peter M, MD, 10 mg at 03/31/18 1312 .  feeding supplement (NEPRO CARB STEADY) liquid 237 mL, 237 mL,  Oral, BID BM, Jettie Booze, MD, 237 mL at 03/31/18 2205 .  gabapentin (NEURONTIN) capsule 100 mg, 100 mg, Oral, QHS, [place], Peter M, MD, 100 mg at 03/31/18 2206 .  guaiFENesin (ROBITUSSIN) 100 MG/5ML solution 100 mg, 5 mL, Oral, Q6H PRN, Jettie Booze, MD, 100 mg at 03/31/18 1316 .  heparin injection 5,000 Units, 5,000 Units, Subcutaneous, Q8H, [place], Peter M, MD, 5,000 Units at 04/01/18 0610 .  hydrALAZINE (APRESOLINE) tablet 25 mg, 25 mg, Oral, BID, [place], Peter M, MD, 25 mg at 03/31/18 2206 .  insulin aspart (novoLOG) injection 0-15 Units, 0-15 Units, Subcutaneous, TID WC, Cheryln Manly, NP, 8 Units at 04/01/18 0650 .  iopamidol (ISOVUE-370) 76 % injection, , , ,  .  metoprolol succinate (TOPROL-XL) 24 hr tablet 25 mg, 25 mg, Oral, QHS, [place], Peter M, MD, 25 mg at 03/31/18 2209 .  ondansetron (ZOFRAN) injection 4 mg, 4 mg, Intravenous, Q6H PRN, [place], Peter M, MD .  pantoprazole (PROTONIX) EC tablet 40 mg, 40 mg, Oral, Daily, [place], Peter M, MD, 40 mg at 03/31/18 1314 .  sodium chloride flush (NS) 0.9 % injection 3 mL, 3 mL, Intravenous, Q12H, [place], Peter M, MD, 3 mL at 03/31/18 2210 .  sodium chloride flush (NS) 0.9 % injection 3 mL, 3 mL, Intravenous, PRN, [place], Peter M, MD Exam: Current vital signs: BP (!) 148/42 (BP Location: Right Arm)   Pulse 66   Temp 98 F (36.7 C) (Oral)   Resp 18   Ht 5\' 6"  (1.676 m)   Wt 68.8 kg (151 lb 10.8 oz)   SpO2 98%   BMI 24.48 kg/m  Vital signs in last 24 hours: Temp:  [98 F (36.7 C)-99.3 F (37.4 C)] 98 F (36.7 C) (07/19 0808) Pulse Rate:  [53-77] 66 (07/19 0342) Resp:  [16-21] 18 (07/19 0342) BP: (82-168)/(41-67) 148/42 (07/19 0342) SpO2:  [92 %-98 %] 98 % (07/19 0808) Weight:  [65.5 kg (144 lb 6.4 oz)-68.8 kg (151 lb 10.8 oz)] 68.8 kg (151 lb 10.8 oz) (07/19 0342) General: Patient is awake, alert in no distress HEENT: Normocephalic atraumatic Lungs: Clear to auscultation with decreased breath sounds at both  bases Abdomen: Soft nondistended nontender Extremities: Warm well perfused Neurological exam Awake, poor attention concentration, intermittently opens and closes eyes. Follows commands inconsistently. Speech is clear but short.  Appears to be in some distress due to shortness of breath. Was able to name some simple objects on prompting. Cranial nerves: Pupils equal round react to light, extra ocular movements intact, blinks to threat from both sides, face is symmetric, tongue is midline. Motor exam: Right upper and lower extremity 5/5.  Left upper extremity is at least a 4+/5 but his exam is very difficult due to inattention. Left lower extremity was between 3/5 to 5/5 and multiple exams that were done. Sensory exam: Intact light touch.  No neglect or extension. Coordination: Did not follow instructions for cerebellar exam. Gait testing was deferred  at this time. NIH stroke scale 1a Level of Conscious.: 0 1b LOC Questions: 0 1c LOC Commands: 1 2 Best Gaze: 0 3 Visual: 0 4 Facial Palsy: 0 5a Motor Arm - left: 1 5b Motor Arm - Right: 0 6a Motor Leg - Left: 0 6b Motor Leg - Right: 0 7 Limb Ataxia: 0 8 Sensory: 0 9 Best Language: 0 10 Dysarthria: 0 11 Extinct. and Inatten.: 0 TOTAL: 2  Labs I have reviewed labs in epic and the results pertinent to this consultation are: Anemia, hyponatremia, hyperglycemia, increased creatinine which is improved from yesterday.  Hyperkalemia.  CBC    Component Value Date/Time   WBC 9.3 03/31/2018 0054   RBC 3.06 (L) 03/31/2018 0054   HGB 9.7 (L) 03/31/2018 0054   HCT 29.5 (L) 03/31/2018 0054   PLT 151 03/31/2018 0054   MCV 96.4 03/31/2018 0054   MCH 31.7 03/31/2018 0054   MCHC 32.9 03/31/2018 0054   RDW 14.5 03/31/2018 0054   LYMPHSABS 1.5 12/04/2017 0627   MONOABS 0.6 12/04/2017 0627   EOSABS 0.3 12/04/2017 0627   BASOSABS 0.0 12/04/2017 0627    CMP     Component Value Date/Time   NA 134 (L) 04/01/2018 0330   K 5.3 (H) 04/01/2018  0330   CL 93 (L) 04/01/2018 0330   CO2 31 04/01/2018 0330   GLUCOSE 241 (H) 04/01/2018 0330   BUN 24 (H) 04/01/2018 0330   CREATININE 6.77 (H) 04/01/2018 0330   CALCIUM 9.0 04/01/2018 0330   PROT 7.5 12/04/2017 0627   ALBUMIN 4.0 12/04/2017 0627   AST 26 12/04/2017 0627   ALT 18 12/04/2017 0627   ALKPHOS 84 12/04/2017 0627   BILITOT 0.9 12/04/2017 0627   GFRNONAA 8 (L) 04/01/2018 0330   GFRAA 9 (L) 04/01/2018 0330   Lipid panel Total cholesterol 69, LDL 36.   Imaging I have reviewed the images obtained:  CT-scan of the brain-shows no acute changes. 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  Difficult to assess age.  No occlusions or's significant stenosis in the anterior circulation. Pleural effusions greater on right than left MRI of the brain shows no evidence of acute stroke.  2D echo: Study Conclusions  - Left ventricle: The cavity size was normal. Wall thickness was   increased in a pattern of moderate LVH. Systolic function was   normal. The estimated ejection fraction was 50%. Doppler   parameters are consistent with a reversible restrictive pattern,   indicative of decreased left ventricular diastolic compliance   and/or increased left atrial pressure (grade 3 diastolic   dysfunction). - Regional wall motion abnormality: Hypokinesis of the mid   inferoseptal, basal-mid inferior, and mid inferolateral   myocardium. - Aortic valve: Trileaflet; mildly thickened, mildly calcified   leaflets. - Mitral valve: There was mild regurgitation. - Right ventricle: Systolic function was mildly reduced. - Tricuspid valve: There was mild regurgitation. - Pulmonic valve: There was trivial regurgitation. - Pulmonary arteries: PA peak pressure: 41 mm Hg (S).  Assessment:  62 year old Asian man with above said past medical history who was admitted for cardiac catheterization had drug-eluting stents placed,  was working with physical therapy this morning when he had sudden onset of generalized weakness and on exam was noted to have more left-sided weakness along with intermittent decreased level of consciousness. His examination was consistent with some left-sided weakness but was marred by a lot of inattention. CT angiogram done  showed right vertebral and right PICA occlusion, which I am not sure if it is acute or chronic but cannot explain his clinical findings. Because of unclear exam and etiology of his presentation, he was taken in for a stat MRI of the brain that did not show any acute stroke. The CT angiogram of the head and neck also revealed pleural effusions and pulmonary congestion secondary to heart failure. Given the acute onset, although proceeded with shortness of breath and ongoing headaches, I cannot rule out a small posterior circulation stroke that was missed on MRI given his multiple risk factors including new found possible paroxysmal atrial fibrillation. My list of differentials at this time includes:. 1.  Tiny posterior circulation stroke of small vessel etiology versus cardio embolic etiology 2.  TIA 3.  Toxic metabolic encephalopathy in the setting of deranged renal function, pulmonary edema etc. 4.  Side effects of medications such as gabapentin which is known to cause altered mental status and myoclonus in patients with renal insufficiency (he is on a very minimal dose but consider discontinuing or using alternatives) 5.  Syncopal or presyncopal episode  He is not a candidate for IV TPA due to low stroke scale. He is not a candidate for endovascular treatment because of no evidence of acute large vessel occlusion corresponding to symptoms. Most of a stroke risk factor work-up has been completed.  Recommendations: -I would recommend continuing antiplatelets-aspirin and Plavix along with high-dose statin. -2D echocardiogram is already been obtained.  Do not see the need to  repeat. -Maintain patient on telemetry -Frequent neurochecks -Lipid panel has already been checked.  LDL 36. -Check hemoglobin A1c-goal A1c less than 7 for stroke prevention. -PT, OT, speech therapy -N.p.o. until cleared by swallow evaluation. -If history of paroxysmal atrial fibrillation has been confirmed, then in the longer term, he should be a candidate for anticoagulation with oral anticoagulant. -Management of toxic metabolic encephalopathies per primary team. -Consider replacing gabapentin with another agent -Outpatient neurology consultations for off-and-on headaches that have been going on for a couple months.  Symptomatic treatment of headaches in-house with Tylenol.  If headaches remain persistent, can use migraine cocktail with Toradol/Reglan/Benadryl IV -Please call neurology with questions as needed.  -- Amie Portland, MD [facility] Pager: [phone] If 7pm to 7am, please call on call as listed on AMION.  CRITICAL CARE ATTESTATION This patient is critically ill and at significant risk of neurological worsening, death and care requires constant monitoring of vital signs, hemodynamics,respiratory and cardiac monitoring. I spent 55  minutes of neurocritical care time performing neurological assessment, discussion with family, other specialists and medical decision making of high complexityin the care of  this patient.

## 2018-04-01 NOTE — Progress Notes (Addendum)
CARDIAC REHAB PHASE I   PRE:  Rate/Rhythm: 64 SR  BP:  Sitting: 170/48      SaO2: 91 RA  MODE:  Ambulation: 400 ft    Pt ambulated 418ft in hallway independently. Pt desated to 89 on RA and placed on 2L for comfort. Pt denying CP or dizziness. When turning to go back to [facility] from [place], pts legs gave out a little. Pt assisted in walking about 64ft when pt became weak again. Gait belt placed around pt. Passerby helped get NT and a wheelchair. Pt wheeled back to room, intermittently responsive. RN and PAs made aware and at bedside.   [id number] Rufina Falco, RN BSN 04/01/2018 8:30 AM

## 2018-04-01 NOTE — Progress Notes (Signed)
SATURATION QUALIFICATIONS: (This note is used to comply with regulatory documentation for home oxygen)  Patient Saturations on Room Air at Rest = 85%  Patient Saturations on Room Air while Ambulating = 89%  Patient Saturations on 2 Liters of oxygen while Ambulating = 94%  Please briefly explain why patient needs home oxygen:  Patient significantly desaturates while on RA at rest.

## 2018-04-01 NOTE — Care Management Note (Signed)
Case Management Note  Patient Details  Name: Kenneth [name] MRN: [id number] Date of Birth: June 28, 1956  Subjective/Objective:  From home, will need home oxygen at discharge, ambulatory sats are in but need oxygen order from MD before can be set up.                   Action/Plan: NCM will follow for dc needs.  Expected Discharge Date:                  Expected Discharge Plan:  Home/Self Care  In-House Referral:     Discharge planning Services  CM Consult  Post Acute Care Choice:  Durable Medical Equipment Choice offered to:  Patient  DME Arranged:    DME Agency:     HH Arranged:    [place] Agency:     Status of Service:  In process, will continue to follow  If discussed at Long Length of Stay Meetings, dates discussed:    Additional Comments:  Zenon Mayo, RN 04/01/2018, 12:08 PM

## 2018-04-01 NOTE — Code Documentation (Signed)
62 yo Male post-cath on [facility] noted to have sudden onset of left arm sensory changes and some noted weakness while walking this morning. HX of Dialysis, Diabetes, and Cardiac Cath. Pt was completing routine walk down the hallway with staff when he reported the left side sensory changes. Pt had to be placed in a wheelchair and brought back to the room. Rapid Response, Hella, RN, was called, and Code Stroke was activated. Pt's Initial SBP was 165 reported by staff. BP upon assessment was 169/56. Pt speaks [place] and a translator was being used for assessment. Reported "a bad headache" and pt would fall in and out of sleep while he was being examined. Initial NIHSS 2 due to left arm drift and reported decreased sensation on the left. Pt taken for CT Head. Followed by CTA. Both negative for ischemic stroke. Taken for a STAT MRI and MRI was negative for stroke. Code Stroke Cancelled and patient taken back to [facility]. Handoff given to [place], Therapist, sports. P

## 2018-04-01 NOTE — Significant Event (Signed)
Rapid Response Event Note  Overview: Time Called: 0824 Arrival Time: 0827 Event Type: Neurologic  Initial Focused Assessment: Per staff patient was last known well at 0810.  While he was walking with cardiac rehab he became "weak in his legs" then seemed to have some left side weakness.  Placed in wheelchair and returned to room.  Upon my arrival patient lying in bed occasionally interactive.  Utilizing Marketing executive as well. BP 161/110  SR 66  RR 20  O2 sat 91% on 2L [place] Pt complains of a head ache and some shortness of breath  Dr Irish Lack at bedside confirmed patient is normally alert and oriented.   Interventions: Code Stroke called:   NIHSS 5:  Drowsy/occasionally interactive, stated age 62 never answered "what month it is",  Left arm drift, decreased sensory on left.  Dr Rory Percy at bedside to assess patient.  Stat Head CT done, CTA head and neck done, limited MRI done.  Code stroke canceled per Dr Rory Percy  Patient remains drowsy and occasionally interactive.  Returned to [employer], [employer] off with [place] (if not transferred): RN to call if assistance needed.  Event Summary: Name of Physician Notified: Irish Lack  at [phone]  Name of Consulting Physician Notified: Rory Percy at [place]        Raliegh Ip

## 2018-04-01 NOTE — Progress Notes (Signed)
Received call from HF team The provider confirmed that patient indeed has PAF.  Rec: Should start [place] now. No need to wait to initiate treatment with DOAC as there is no stroke on the MRI.  Neurology will be available as needed. Please call neurology with questions.  -- Amie Portland, MD [facility] Pager: [phone] If 7pm to 7am, please call on call as listed on AMION.

## 2018-04-01 NOTE — Progress Notes (Signed)
Called to patient room approximately 0825 by cardiac rehab, who reported patient's condition deteriorated during ambulation.  Assessment showed dizziness, SOB, "feels like I'm sinking", "legs stiff", HA.  Patient states that his age is 6 (medical record indicates 89), the date is 44, and he is in "a hospital" but unable to name.  Rapid response, Raliegh Ip, RN called to room, Code Stroke called.  Also seeing patient were:  Casandra Doffing, MD, Charlie Pitter, RN, neuro, Veneta Penton, PA, renal, Oda Kilts, PA, heart failure team, Amie Portland, MD, neuro.  VSS. CBG 94.   Lung sounds diminished with fine crackles at left base.

## 2018-04-01 NOTE — Progress Notes (Signed)
[facility] Progress Note   Subjective:  Seen in room, responsive but very slow. Rapid response called this morning after near syncopal episode while ambulating. C/o headache and possible mild L sided weakness/parasthesias. Going urgently for head CT.  Objective Vitals:   03/31/18 2206 03/31/18 2209 04/01/18 0342 04/01/18 0808  BP: (!) 147/63  (!) 148/42   Pulse:  70 66   Resp:   18   Temp:   98.7 F (37.1 C) 98 F (36.7 C)  TempSrc:   Oral Oral  SpO2:   92% 98%  Weight:   68.8 kg (151 lb 10.8 oz)   Height:       Physical Exam General: Poorly responsive man, NAD Heart: RRR; no murmur Lungs: CTAB Abdomen: soft, non-tender Extremities: No LE edema; no bruising at R groin cath site Dialysis Access: L AVF + bruit/thrill Neuro: Oriented to place, slow responses, no facial droop, L grip weaker than R  Additional Objective Labs: Basic Metabolic Panel: Recent Labs  Lab 03/30/18 0719 03/30/18 1206 03/31/18 0054 04/01/18 0330  NA 137  --  135 134*  K 6.0* 5.1 6.4* 5.3*  CL 97*  --  94* 93*  CO2 28  --  28 31  GLUCOSE 139* 155* 156* 241*  BUN 32*  --  41* 24*  CREATININE 8.02* 8.29* 9.59* 6.77*  CALCIUM 9.3  --  9.1 9.0   CBC: Recent Labs  Lab 03/30/18 0719 03/30/18 1206 03/31/18 0054  WBC 8.7 6.9 9.3  HGB 9.2* 8.6* 9.7*  HCT 28.6* 26.0* 29.5*  MCV 97.6 96.3 96.4  PLT 150 130* 151   Cardiac Enzymes: Recent Labs  Lab 03/30/18 1206 03/30/18 1841 03/31/18 0054  TROPONINI 0.11* 0.36* 1.13*   CBG: Recent Labs  Lab 03/31/18 1750 03/31/18 2200 03/31/18 2319 04/01/18 0610 04/01/18 0829  GLUCAP 271* 79 186* 258* 94   Studies/Results: Ct Head Code Stroke Wo Contrast  Result Date: 04/01/2018 CLINICAL DATA:  Code stroke. Left-sided weakness beginning at 0810 hours. EXAM: CT HEAD WITHOUT CONTRAST TECHNIQUE: Contiguous axial images were obtained from the base of the skull through the vertex without intravenous contrast. COMPARISON:  07/05/2017  FINDINGS: Brain: Normal appearance without evidence of atrophy, old or acute infarction, mass lesion, hemorrhage hydrocephalus or extra-axial collection. Vascular: There is atherosclerotic calcification of the major vessels at the base of the brain. Skull: Negative Sinuses/Orbits: Clear/normal Other: None ASPECTS ([place] Stroke Program Early CT Score) - Ganglionic level infarction (caudate, lentiform nuclei, internal capsule, insula, M1-M3 cortex): 7 - Supraganglionic infarction (M4-M6 cortex): 3 Total score (0-10 with 10 being normal): 10 IMPRESSION: 1. Negative head CT. 2. ASPECTS is 10. 3. These results were communicated to at 9:26 amon 7/19/2019by text page via the [facility] messaging system. Electronically Signed   By: Nelson Chimes M.D.   On: 04/01/2018 09:27   Medications: . sodium chloride    . sodium chloride     . aspirin  81 mg Oral Daily  . atorvastatin  80 mg Oral Daily  . calcium acetate  667 mg Oral TID WC  . Chlorhexidine Gluconate Cloth  6 each Topical Q0600  . citalopram  20 mg Oral Daily  . clopidogrel  75 mg Oral Daily  . doxercalciferol  3 mcg Intravenous Q M,W,F-HD  . ezetimibe  10 mg Oral Daily  . feeding supplement (NEPRO CARB STEADY)  237 mL Oral BID BM  . gabapentin  100 mg Oral QHS  . heparin  5,000 Units Subcutaneous Q8H  .  hydrALAZINE  25 mg Oral BID  . insulin aspart  0-15 Units Subcutaneous TID WC  . iopamidol      . metoprolol succinate  25 mg Oral QHS  . pantoprazole  40 mg Oral Daily  . sodium chloride flush  3 mL Intravenous Q12H   Dialysis Orders:  MWF - [facility] ([facility])  3.75hrs, BFR 400, DFR 800,  EDW 66.5kg, 2K/ 2.25Ca, L AVF, heparin 4500 bolus - Mircera 50 mcg q2wks - last 03/28/18 - Hectorol 3 mcg IV qHD   Assessment/Plan: 1. NSTEMI w/known Hx CAD s/p CABG in 2017 & PCI to SVG-OM1: Trop elevated; EKG with ST depression. Taken urgently for LHC showing severe 3 vessel obstructive CAD - s/p PCI (7/17). Per cardiology. 2. Pulmonary edema: On initial CXR.  ^ UF with HD. 3. Hyperkalemia: On admit, improved s/p HD. 4. ESRD: Continue per MWF schedule, for HD today after stabilized from AM events. 5. Hypertension/volume: BP slightly high, challenging EDW as tolerated. 6.  Anemia of CKD: Hgb 9.7, not due for ESA yet. 7.  Secondary Hyperparathyroidism: Ca good, Phos at goal as OP.  Continue VDRA, Phoslo. 8.  Nutrition - Renal diet with fluid restriction. Renavite.  9.  A fib - per primary 10. DMT2 - on insulin 11. Reduced responsiveness (7/19): Unclear cause. Head CT ok. Work-up ongoing.   Veneta Penton, PA-C 04/01/2018, 9:35 AM  [facility] Pager: [phone]

## 2018-04-02 DIAGNOSIS — Z955 Presence of coronary angioplasty implant and graft: Secondary | ICD-10-CM

## 2018-04-02 DIAGNOSIS — I5032 Chronic diastolic (congestive) heart failure: Secondary | ICD-10-CM

## 2018-04-02 LAB — RENAL FUNCTION PANEL
ALBUMIN: 3.3 g/dL — AB (ref 3.5–5.0)
ANION GAP: 9 (ref 5–15)
BUN: 23 mg/dL — ABNORMAL HIGH (ref 6–20)
CALCIUM: 9.3 mg/dL (ref 8.9–10.3)
CO2: 31 mmol/L (ref 22–32)
Chloride: 95 mmol/L — ABNORMAL LOW (ref 98–111)
Creatinine, Ser: 6.05 mg/dL — ABNORMAL HIGH (ref 0.61–1.24)
GFR, EST AFRICAN AMERICAN: 11 mL/min — AB (ref >60)
GFR, EST NON AFRICAN AMERICAN: 9 mL/min — AB (ref >60)
Glucose, Bld: 170 mg/dL — ABNORMAL HIGH (ref 70–99)
PHOSPHORUS: 2.3 mg/dL — AB (ref 2.5–4.6)
POTASSIUM: 4.7 mmol/L (ref 3.5–5.1)
SODIUM: 135 mmol/L (ref 135–145)

## 2018-04-02 LAB — BASIC METABOLIC PANEL
Anion gap: 11 (ref 5–15)
BUN: 14 mg/dL (ref 6–20)
CO2: 29 mmol/L (ref 22–32)
CREATININE: 4.98 mg/dL — AB (ref 0.61–1.24)
Calcium: 8.9 mg/dL (ref 8.9–10.3)
Chloride: 95 mmol/L — ABNORMAL LOW (ref 98–111)
GFR calc Af Amer: 13 mL/min — ABNORMAL LOW (ref >60)
GFR calc non Af Amer: 11 mL/min — ABNORMAL LOW (ref >60)
GLUCOSE: 229 mg/dL — AB (ref 70–99)
POTASSIUM: 4.3 mmol/L (ref 3.5–5.1)
SODIUM: 135 mmol/L (ref 135–145)

## 2018-04-02 LAB — GLUCOSE, CAPILLARY
Glucose-Capillary: 174 mg/dL — ABNORMAL HIGH (ref 70–99)
Glucose-Capillary: 264 mg/dL — ABNORMAL HIGH (ref 70–99)
Glucose-Capillary: 237 mg/dL — ABNORMAL HIGH (ref 70–99)

## 2018-04-02 MED ORDER — SODIUM CHLORIDE 0.9 % IV SOLN: 100.0000 mL | INTRAVENOUS | Status: DC | PRN

## 2018-04-02 MED ORDER — LIDOCAINE-PRILOCAINE 2.5-2.5 % EX CREA
1.0000 "application " | TOPICAL_CREAM | CUTANEOUS | Status: DC | PRN
  Filled 2018-04-02: qty 5

## 2018-04-02 MED ORDER — HEPARIN SODIUM (PORCINE) 1000 UNIT/ML DIALYSIS
1000.0000 [IU] | INTRAMUSCULAR | Status: DC | PRN
  Filled 2018-04-02: qty 1

## 2018-04-02 MED ORDER — ISOSORBIDE MONONITRATE ER 30 MG PO TB24
30.0000 mg | ORAL_TABLET | Freq: Every day | ORAL | Status: DC
  Administered 2018-04-02: 30 mg via ORAL
  Filled 2018-04-02: qty 1

## 2018-04-02 MED ORDER — HEPARIN SODIUM (PORCINE) 1000 UNIT/ML DIALYSIS
20.0000 [IU]/kg | INTRAMUSCULAR | Status: DC | PRN
  Filled 2018-04-02: qty 2

## 2018-04-02 MED ORDER — PENTAFLUOROPROP-TETRAFLUOROETH EX AERO: 1.0000 "application " | INHALATION_SPRAY | CUTANEOUS | Status: DC | PRN

## 2018-04-02 MED ORDER — LIDOCAINE HCL (PF) 1 % IJ SOLN: 5.0000 mL | INTRAMUSCULAR | Status: DC | PRN

## 2018-04-02 MED ORDER — ALTEPLASE 2 MG IJ SOLR: 2.0000 mg | Freq: Once | INTRAMUSCULAR | Status: DC | PRN

## 2018-04-02 NOTE — Progress Notes (Addendum)
Progress Note  Patient Name: Kenneth [name] Date of Encounter: 04/02/2018  Primary Cardiologist: Loralie Champagne, MD   Subjective   Ambulated this morning, continue to feel DOE and some chest discomfort. Still not back at baseline.   Inpatient Medications    Scheduled Meds: . apixaban  5 mg Oral BID  . atorvastatin  80 mg Oral Daily  . calcium acetate  667 mg Oral TID WC  . Chlorhexidine Gluconate Cloth  6 each Topical Q0600  . citalopram  20 mg Oral Daily  . clopidogrel  75 mg Oral Daily  . doxercalciferol  3 mcg Intravenous Q M,W,F-HD  . ezetimibe  10 mg Oral Daily  . feeding supplement (NEPRO CARB STEADY)  237 mL Oral BID BM  . gabapentin  100 mg Oral QHS  . hydrALAZINE  25 mg Oral BID  . insulin aspart  0-15 Units Subcutaneous TID WC  . isosorbide mononitrate  30 mg Oral Daily  . metoprolol succinate  25 mg Oral QHS  . pantoprazole  40 mg Oral Daily  . sodium chloride flush  3 mL Intravenous Q12H   Continuous Infusions: . sodium chloride     PRN Meds: sodium chloride, acetaminophen, guaiFENesin, ondansetron (ZOFRAN) IV, sodium chloride flush   Vital Signs    Vitals:   04/01/18 2009 04/01/18 2207 04/02/18 0420 04/02/18 0740  BP: (!) 156/57 (!) 178/64 (!) 163/58 (!) 165/72  Pulse: 70 68 60 63  Resp: 16  17 17   Temp: 98.4 F (36.9 C)  98.4 F (36.9 C)   TempSrc: Oral  Oral   SpO2: 95%  95% 92%  Weight:   150 lb 12.7 oz (68.4 kg)   Height:        Intake/Output Summary (Last 24 hours) at 04/02/2018 1159 Last data filed at 04/01/2018 1801 Gross per 24 hour  Intake -  Output 2500 ml  Net -2500 ml   Filed Weights   04/01/18 1400 04/01/18 1801 04/02/18 0420  Weight: 145 lb 11.6 oz (66.1 kg) 140 lb 3.4 oz (63.6 kg) 150 lb 12.7 oz (68.4 kg)    Telemetry    NSR - Personally Reviewed  ECG    NSR with TWI in lateral leads - Personally Reviewed  Physical Exam   GEN: No acute distress.   Neck: No JVD Cardiac: RRR, no murmurs, rubs, or gallops.    Respiratory: Clear to auscultation bilaterally. GI: Soft, nontender, non-distended  MS: No edema; No deformity. Neuro:  Nonfocal  Psych: Normal affect   Labs    Chemistry Recent Labs  Lab 03/31/18 0054 04/01/18 0330 04/02/18 0224  NA 135 134* 135  K 6.4* 5.3* 4.3  CL 94* 93* 95*  CO2 28 31 29   GLUCOSE 156* 241* 229*  BUN 41* 24* 14  CREATININE 9.59* 6.77* 4.98*  CALCIUM 9.1 9.0 8.9  GFRNONAA 5* 8* 11*  GFRAA 6* 9* 13*  ANIONGAP 13 10 11      Hematology Recent Labs  Lab 03/30/18 0719 03/30/18 1206 03/31/18 0054  WBC 8.7 6.9 9.3  RBC 2.93* 2.70* 3.06*  HGB 9.2* 8.6* 9.7*  HCT 28.6* 26.0* 29.5*  MCV 97.6 96.3 96.4  MCH 31.4 31.9 31.7  MCHC 32.2 33.1 32.9  RDW 14.3 14.5 14.5  PLT 150 130* 151    Cardiac Enzymes Recent Labs  Lab 03/30/18 1206 03/30/18 1841 03/31/18 0054  TROPONINI 0.11* 0.36* 1.13*    Recent Labs  Lab 03/30/18 0733  TROPIPOC 0.11*     BNPNo  results for input(s): BNP, PROBNP in the last 168 hours.   DDimer No results for input(s): DDIMER in the last 168 hours.   Radiology    Ct Angio Head W Or Wo Contrast  Result Date: 04/01/2018 CLINICAL DATA:  Left-sided weakness. Coronary stent intervention 03/30/2018 EXAM: CT ANGIOGRAPHY HEAD AND NECK TECHNIQUE: Multidetector CT imaging of the head and neck was performed using the standard protocol during bolus administration of intravenous contrast. Multiplanar CT image reconstructions and MIPs were obtained to evaluate the vascular anatomy. Carotid stenosis measurements (when applicable) are obtained utilizing NASCET criteria, using the distal internal carotid diameter as the denominator. CONTRAST:  26mL ISOVUE-370 IOPAMIDOL (ISOVUE-370) INJECTION 76% COMPARISON:  CT head 04/01/2018 FINDINGS: CTA NECK FINDINGS Aortic arch: Atherosclerotic aortic arch and proximal great vessels. Proximal great vessels widely patent. Right carotid system: Right carotid system widely patent with minimal scattered  atherosclerotic disease. No significant stenosis. Left carotid system: Atherosclerotic plaque at the left carotid bifurcation. 25% diameter stenosis proximal left internal carotid artery Vertebral arteries: Right vertebral artery is occluded at the origin and occluded to the distal intracranial segment where there is trickle flow. Left vertebral artery widely patent. Nonobstructing plaque at the origin. Skeleton: Cervical spondylosis.  No acute skeletal abnormality. Other neck: Negative for mass or adenopathy in the neck. Upper chest: Moderate right pleural effusion and mild left pleural effusion. Pulmonary edema. Review of the MIP images confirms the above findings CTA HEAD FINDINGS Anterior circulation: Atherosclerotic disease throughout the cavernous carotid bilaterally with moderate stenosis bilaterally. Anterior and middle cerebral arteries patent bilaterally without stenosis or aneurysm Posterior circulation: Occluded right vertebral artery with trickle flow distally. Right PICA occluded. Left vertebral artery and left PICA patent. Basilar patent. Small AICA patent bilaterally. Superior cerebellar and posterior cerebral arteries normal bilaterally. Fetal origin posterior cerebral artery bilaterally. Venous sinuses: Patent Anatomic variants: None Delayed phase: Not performed Review of the MIP images confirms the above findings IMPRESSION: 25% diameter proximal left internal carotid artery. Right internal carotid artery widely patent in the neck. 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 Right PICA appears occluded. Bilateral pleural effusions and pulmonary edema secondary to congestive heart failure. The study was reviewed with Dr.Arora Electronically Signed   By: Franchot Gallo M.D.   On: 04/01/2018 09:41   Ct Angio Neck W Or Wo Contrast  Result Date: 04/01/2018 CLINICAL DATA:  Left-sided weakness. Coronary stent  intervention 03/30/2018 EXAM: CT ANGIOGRAPHY HEAD AND NECK TECHNIQUE: Multidetector CT imaging of the head and neck was performed using the standard protocol during bolus administration of intravenous contrast. Multiplanar CT image reconstructions and MIPs were obtained to evaluate the vascular anatomy. Carotid stenosis measurements (when applicable) are obtained utilizing NASCET criteria, using the distal internal carotid diameter as the denominator. CONTRAST:  12mL ISOVUE-370 IOPAMIDOL (ISOVUE-370) INJECTION 76% COMPARISON:  CT head 04/01/2018 FINDINGS: CTA NECK FINDINGS Aortic arch: Atherosclerotic aortic arch and proximal great vessels. Proximal great vessels widely patent. Right carotid system: Right carotid system widely patent with minimal scattered atherosclerotic disease. No significant stenosis. Left carotid system: Atherosclerotic plaque at the left carotid bifurcation. 25% diameter stenosis proximal left internal carotid artery Vertebral arteries: Right vertebral artery is occluded at the origin and occluded to the distal intracranial segment where there is trickle flow. Left vertebral artery widely patent. Nonobstructing plaque at the origin. Skeleton: Cervical spondylosis.  No acute skeletal abnormality. Other neck: Negative for mass or adenopathy in the neck. Upper  chest: Moderate right pleural effusion and mild left pleural effusion. Pulmonary edema. Review of the MIP images confirms the above findings CTA HEAD FINDINGS Anterior circulation: Atherosclerotic disease throughout the cavernous carotid bilaterally with moderate stenosis bilaterally. Anterior and middle cerebral arteries patent bilaterally without stenosis or aneurysm Posterior circulation: Occluded right vertebral artery with trickle flow distally. Right PICA occluded. Left vertebral artery and left PICA patent. Basilar patent. Small AICA patent bilaterally. Superior cerebellar and posterior cerebral arteries normal bilaterally. Fetal  origin posterior cerebral artery bilaterally. Venous sinuses: Patent Anatomic variants: None Delayed phase: Not performed Review of the MIP images confirms the above findings IMPRESSION: 25% diameter proximal left internal carotid artery. Right internal carotid artery widely patent in the neck. 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 Right PICA appears occluded. Bilateral pleural effusions and pulmonary edema secondary to congestive heart failure. The study was reviewed with Dr.Arora Electronically Signed   By: Franchot Gallo M.D.   On: 04/01/2018 09:41   Mr Brain Wo Contrast  Result Date: 04/01/2018 CLINICAL DATA:  Left-sided weakness EXAM: MRI HEAD WITHOUT CONTRAST TECHNIQUE: Multiplanar, multiecho pulse sequences of the brain and surrounding structures were obtained without intravenous contrast. COMPARISON:  CT head 04/01/2018 FINDINGS: Limited imaging protocol per stroke neurology. Diffusion, FLAIR, and gradient echo imaging only performed. Negative for acute infarct. Mild chronic microvascular ischemic change in the white matter. Small chronic infarct in the left frontal cortex. Negative for hemorrhage or mass. Mild atrophy IMPRESSION: With negative for acute infarct. Mild chronic microvascular ischemic change. Electronically Signed   By: Franchot Gallo M.D.   On: 04/01/2018 10:05   Ct Head Code Stroke Wo Contrast  Result Date: 04/01/2018 CLINICAL DATA:  Code stroke. Left-sided weakness beginning at 0810 hours. EXAM: CT HEAD WITHOUT CONTRAST TECHNIQUE: Contiguous axial images were obtained from the base of the skull through the vertex without intravenous contrast. COMPARISON:  07/05/2017 FINDINGS: Brain: Normal appearance without evidence of atrophy, old or acute infarction, mass lesion, hemorrhage hydrocephalus or extra-axial collection. Vascular: There is atherosclerotic calcification of the major vessels at  the base of the brain. Skull: Negative Sinuses/Orbits: Clear/normal Other: None ASPECTS ([place] Stroke Program Early CT Score) - Ganglionic level infarction (caudate, lentiform nuclei, internal capsule, insula, M1-M3 cortex): 7 - Supraganglionic infarction (M4-M6 cortex): 3 Total score (0-10 with 10 being normal): 10 IMPRESSION: 1. Negative head CT. 2. ASPECTS is 10. 3. These results were communicated to at 9:26 amon 7/19/2019by text page via the [facility] messaging system. Electronically Signed   By: Nelson Chimes M.D.   On: 04/01/2018 09:27    Cardiac Studies   Cath 03/30/2018  Mid LM to Dist LM lesion is 30% stenosed.  Prox LAD lesion is 95% stenosed.  Ost 1st Diag to 1st Diag lesion is 90% stenosed.  Ost 1st Mrg to 1st Mrg lesion is 90% stenosed.  Prox Cx to Mid Cx lesion is 85% stenosed.  Prox RCA to Mid RCA lesion is 100% stenosed.  SVG.  Origin to Prox Graft lesion is 100% stenosed.  Origin to Prox Graft lesion is 99% stenosed.  LIMA graft was visualized by angiography and is normal in caliber.  Insertion lesion is 50% stenosed.  Post intervention, there is a 0% residual stenosis.  A drug-eluting stent was successfully placed using a STENT SYNERGY DES 3X38.  A drug-eluting stent was successfully placed using a STENT SYNERGY DES 3X12.  LV end diastolic pressure is moderately elevated.  1. Severe 3 vessel obstructive CAD 2. Patent LIMA to the LAD. At the distal anastomosis the graft is angulated with a cork screw turn and there appears to be 50% eccentric narrowing. There is excellent TIMI 3 flow. This does not appear different from prior angiograms 3. Severe in stent restenosis of SVG to OM 4. Chronic occlusion of SVG to RCA 5. Moderately elevated LVEDP 6. Successful PCI with DES x 2 of native OM1.   Recommend uninterrupted dual antiplatelet therapy with Aspirin 81mg  daily and Clopidogrel 75mg  daily for a minimum of 12 months (ACS - Class I recommendation). I would  treat residual CAD medically. He will need dialysis today.    Echo 03/31/2018 LV EF: 50% Study Conclusions  - Left ventricle: The cavity size was normal. Wall thickness was   increased in a pattern of moderate LVH. Systolic function was   normal. The estimated ejection fraction was 50%. Doppler   parameters are consistent with a reversible restrictive pattern,   indicative of decreased left ventricular diastolic compliance   and/or increased left atrial pressure (grade 3 diastolic   dysfunction). - Regional wall motion abnormality: Hypokinesis of the mid   inferoseptal, basal-mid inferior, and mid inferolateral   myocardium. - Aortic valve: Trileaflet; mildly thickened, mildly calcified   leaflets. - Mitral valve: There was mild regurgitation. - Right ventricle: Systolic function was mildly reduced. - Tricuspid valve: There was mild regurgitation. - Pulmonic valve: There was trivial regurgitation. - Pulmonary arteries: PA peak pressure: 41 mm Hg (S).  Impressions:  - Similar to prior study. EF estimated visually and with 3D echo.  Patient Profile     62 y.o. [name] with PMH of type II diabetes, CKD stage IV requiring HD for a period of time, CAD, and chronic systolic HF admitted for NSTEMI  Assessment & Plan    1. CAD: NSTEMI. Severe in-stent restenosis of SVG-OM, s/p DES x 2 to native OM1  - continue ASA and plavix. ASA later stopped due to the need for systemic anticoagulation  - continue zetia and lipitor. Add 30mg  daily Imdur for antianginal purpose. Uptitrate antianginal meds. May consider D/C tomorrow.   2. PAF: noted postop CABG and again during this admission. Stated on eliquis 5mg  BID  3. Acute on chronic diastolic HF: euvolemic today, underwent HD on Friday  4. AMS: consulted neurology due to concern of TIA vs CVA. CTA head/neck with occluded right PICA and vertebral, but these do not explain symptoms.  MRI head does not show CVA  5. ESRD: on HD. Last HD  on Friday  For questions or updates, please contact [place] Please consult [email or web address] for contact info under Cardiology/STEMI.      Hilbert Corrigan, PA  04/02/2018, 11:59 AM     Patient seen and examined. Agree with assessment and plan. Pt complains of dyspnea and mild residual chest pain.  Neurologic symptoms have resolved.  No stroke on MRI.Marland Kitchen Eliquis started, continue Plavix; ASA dc'd. Without potential sources of ischemia at cath will add imdur initially at 30 mg. EF 50% with grade 3 DD. Possible dc tomorrow with dialysis of Monday.   Troy Sine, MD, [facility] 04/02/2018 12:28 PM

## 2018-04-02 NOTE — Progress Notes (Signed)
CARDIAC REHAB PHASE I   PRE:  Rate/Rhythm: 63 SR  BP:  Supine: 179/61  Sitting:   Standing:    SaO2: 96%RA  MODE:  Ambulation: 400 ft   POST:  Rate/Rhythm: 66 SR  BP:  Supine: 163/61  Sitting:   Standing:    SaO2: 96%RA [id number] Pt walked 400 ft on RA with gait belt use, rolling walker and asst x1. One asst to follow with wheelchair. We monitored sats whole walk and 95-96%RA. Pt sat once in wheelchair to rest. Also took one standing rest break. Pt stated he has walker at home if needed. Reviewed MI restrictions with pt and discussed importance of plavix. Pt has attended CRP 2 [place] before but he stated he had some foot issues and he is afraid to try again. He also has dialysis on MWF which would be CRP days. Pt has been referred but doubt can attend. Discussed NTG. Pt does not remember ever having a prescription. He remembers EMS giving it to him. He prefers to call EMS if he has CP. Back to bed with alarm on. Pt thanked me for the walk. Voiced understanding of discussion. Refer diet to HD dietician.Graylon Good, RN BSN  04/02/2018 9:41 AM

## 2018-04-02 NOTE — Progress Notes (Signed)
[facility] Progress Note   Subjective:  Awake, alert oriented X 3. Appears at baseline. C/O "a little chest pain and coughing". Dry cough, no evidence of volume overload on exam.   Objective Vitals:   04/01/18 2009 04/01/18 2207 04/02/18 0420 04/02/18 0740  BP: (!) 156/57 (!) 178/64 (!) 163/58 (!) 165/72  Pulse: 70 68 60 63  Resp: 16  17 17   Temp: 98.4 F (36.9 C)  98.4 F (36.9 C)   TempSrc: Oral  Oral   SpO2: 95%  95% 92%  Weight:   68.4 kg (150 lb 12.7 oz)   Height:       Physical Exam General: Pleasant, NAD Heart: S1,S2, RRR No M/R/G Lungs: Slightly decreased in bases otherwise CTAB Abdomen: Active BS Extremities: No LE edema Dialysis Access: LUA AVF + bruit    Additional Objective Labs: Basic Metabolic Panel: Recent Labs  Lab 03/31/18 0054 04/01/18 0330 04/02/18 0224  NA 135 134* 135  K 6.4* 5.3* 4.3  CL 94* 93* 95*  CO2 28 31 29   GLUCOSE 156* 241* 229*  BUN 41* 24* 14  CREATININE 9.59* 6.77* 4.98*  CALCIUM 9.1 9.0 8.9   Liver Function Tests: No results for input(s): AST, ALT, ALKPHOS, BILITOT, PROT, ALBUMIN in the last 168 hours. No results for input(s): LIPASE, AMYLASE in the last 168 hours. CBC: Recent Labs  Lab 03/30/18 0719 03/30/18 1206 03/31/18 0054  WBC 8.7 6.9 9.3  HGB 9.2* 8.6* 9.7*  HCT 28.6* 26.0* 29.5*  MCV 97.6 96.3 96.4  PLT 150 130* 151   Blood Culture    Component Value Date/Time   SDES  10/13/2017 1552    TOE LEFT FOURTH Performed at [facility], [place] [address], [place], [place] [address]    [place]  10/13/2017 1552    NONE Performed at [facility], [address] [place], [place] [address]    CULT  10/13/2017 1552    ABUNDANT METHICILLIN RESISTANT STAPHYLOCOCCUS AUREUS   REPTSTATUS 10/16/2017 FINAL 10/13/2017 1552    Cardiac Enzymes: Recent Labs  Lab 03/30/18 1206 03/30/18 1841 03/31/18 0054  TROPONINI 0.11* 0.36* 1.13*   CBG: Recent Labs  Lab 04/01/18 0829  04/01/18 1145 04/01/18 1347 04/01/18 1842 04/01/18 2142  GLUCAP 94 110* 177* 147* 257*   Iron Studies: No results for input(s): IRON, TIBC, TRANSFERRIN, FERRITIN in the last 72 hours. @lablastinr3 @ Studies/Results: Ct Angio Head W Or Wo Contrast  Result Date: 04/01/2018 CLINICAL DATA:  Left-sided weakness. Coronary stent intervention 03/30/2018 EXAM: CT ANGIOGRAPHY HEAD AND NECK TECHNIQUE: Multidetector CT imaging of the head and neck was performed using the standard protocol during bolus administration of intravenous contrast. Multiplanar CT image reconstructions and MIPs were obtained to evaluate the vascular anatomy. Carotid stenosis measurements (when applicable) are obtained utilizing NASCET criteria, using the distal internal carotid diameter as the denominator. CONTRAST:  33mL ISOVUE-370 IOPAMIDOL (ISOVUE-370) INJECTION 76% COMPARISON:  CT head 04/01/2018 FINDINGS: CTA NECK FINDINGS Aortic arch: Atherosclerotic aortic arch and proximal great vessels. Proximal great vessels widely patent. Right carotid system: Right carotid system widely patent with minimal scattered atherosclerotic disease. No significant stenosis. Left carotid system: Atherosclerotic plaque at the left carotid bifurcation. 25% diameter stenosis proximal left internal carotid artery Vertebral arteries: Right vertebral artery is occluded at the origin and occluded to the distal intracranial segment where there is trickle flow. Left vertebral artery widely patent. Nonobstructing plaque at the origin. Skeleton: Cervical spondylosis.  No acute skeletal abnormality. Other neck: Negative for  mass or adenopathy in the neck. Upper chest: Moderate right pleural effusion and mild left pleural effusion. Pulmonary edema. Review of the MIP images confirms the above findings CTA HEAD FINDINGS Anterior circulation: Atherosclerotic disease throughout the cavernous carotid bilaterally with moderate stenosis bilaterally. Anterior and middle  cerebral arteries patent bilaterally without stenosis or aneurysm Posterior circulation: Occluded right vertebral artery with trickle flow distally. Right PICA occluded. Left vertebral artery and left PICA patent. Basilar patent. Small AICA patent bilaterally. Superior cerebellar and posterior cerebral arteries normal bilaterally. Fetal origin posterior cerebral artery bilaterally. Venous sinuses: Patent Anatomic variants: None Delayed phase: Not performed Review of the MIP images confirms the above findings IMPRESSION: 25% diameter proximal left internal carotid artery. Right internal carotid artery widely patent in the neck. 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 Right PICA appears occluded. Bilateral pleural effusions and pulmonary edema secondary to congestive heart failure. The study was reviewed with Dr.Arora Electronically Signed   By: Franchot Gallo M.D.   On: 04/01/2018 09:41   Ct Angio Neck W Or Wo Contrast  Result Date: 04/01/2018 CLINICAL DATA:  Left-sided weakness. Coronary stent intervention 03/30/2018 EXAM: CT ANGIOGRAPHY HEAD AND NECK TECHNIQUE: Multidetector CT imaging of the head and neck was performed using the standard protocol during bolus administration of intravenous contrast. Multiplanar CT image reconstructions and MIPs were obtained to evaluate the vascular anatomy. Carotid stenosis measurements (when applicable) are obtained utilizing NASCET criteria, using the distal internal carotid diameter as the denominator. CONTRAST:  15mL ISOVUE-370 IOPAMIDOL (ISOVUE-370) INJECTION 76% COMPARISON:  CT head 04/01/2018 FINDINGS: CTA NECK FINDINGS Aortic arch: Atherosclerotic aortic arch and proximal great vessels. Proximal great vessels widely patent. Right carotid system: Right carotid system widely patent with minimal scattered atherosclerotic disease. No significant stenosis. Left carotid system:  Atherosclerotic plaque at the left carotid bifurcation. 25% diameter stenosis proximal left internal carotid artery Vertebral arteries: Right vertebral artery is occluded at the origin and occluded to the distal intracranial segment where there is trickle flow. Left vertebral artery widely patent. Nonobstructing plaque at the origin. Skeleton: Cervical spondylosis.  No acute skeletal abnormality. Other neck: Negative for mass or adenopathy in the neck. Upper chest: Moderate right pleural effusion and mild left pleural effusion. Pulmonary edema. Review of the MIP images confirms the above findings CTA HEAD FINDINGS Anterior circulation: Atherosclerotic disease throughout the cavernous carotid bilaterally with moderate stenosis bilaterally. Anterior and middle cerebral arteries patent bilaterally without stenosis or aneurysm Posterior circulation: Occluded right vertebral artery with trickle flow distally. Right PICA occluded. Left vertebral artery and left PICA patent. Basilar patent. Small AICA patent bilaterally. Superior cerebellar and posterior cerebral arteries normal bilaterally. Fetal origin posterior cerebral artery bilaterally. Venous sinuses: Patent Anatomic variants: None Delayed phase: Not performed Review of the MIP images confirms the above findings IMPRESSION: 25% diameter proximal left internal carotid artery. Right internal carotid artery widely patent in the neck. 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 Right PICA appears occluded. Bilateral pleural effusions and pulmonary edema secondary to congestive heart failure. The study was reviewed with Dr.Arora Electronically Signed   By: Franchot Gallo M.D.   On: 04/01/2018 09:41   Mr Brain Wo Contrast  Result Date: 04/01/2018 CLINICAL DATA:  Left-sided weakness EXAM: MRI HEAD WITHOUT CONTRAST TECHNIQUE: Multiplanar, multiecho pulse sequences of the brain and  surrounding structures were obtained without intravenous contrast. COMPARISON:  CT  head 04/01/2018 FINDINGS: Limited imaging protocol per stroke neurology. Diffusion, FLAIR, and gradient echo imaging only performed. Negative for acute infarct. Mild chronic microvascular ischemic change in the white matter. Small chronic infarct in the left frontal cortex. Negative for hemorrhage or mass. Mild atrophy IMPRESSION: With negative for acute infarct. Mild chronic microvascular ischemic change. Electronically Signed   By: Franchot Gallo M.D.   On: 04/01/2018 10:05   Ct Head Code Stroke Wo Contrast  Result Date: 04/01/2018 CLINICAL DATA:  Code stroke. Left-sided weakness beginning at 0810 hours. EXAM: CT HEAD WITHOUT CONTRAST TECHNIQUE: Contiguous axial images were obtained from the base of the skull through the vertex without intravenous contrast. COMPARISON:  07/05/2017 FINDINGS: Brain: Normal appearance without evidence of atrophy, old or acute infarction, mass lesion, hemorrhage hydrocephalus or extra-axial collection. Vascular: There is atherosclerotic calcification of the major vessels at the base of the brain. Skull: Negative Sinuses/Orbits: Clear/normal Other: None ASPECTS ([place] Stroke Program Early CT Score) - Ganglionic level infarction (caudate, lentiform nuclei, internal capsule, insula, M1-M3 cortex): 7 - Supraganglionic infarction (M4-M6 cortex): 3 Total score (0-10 with 10 being normal): 10 IMPRESSION: 1. Negative head CT. 2. ASPECTS is 10. 3. These results were communicated to at 9:26 amon 7/19/2019by text page via the [facility] messaging system. Electronically Signed   By: Nelson Chimes M.D.   On: 04/01/2018 09:27   Medications: . sodium chloride     . apixaban  5 mg Oral BID  . atorvastatin  80 mg Oral Daily  . calcium acetate  667 mg Oral TID WC  . Chlorhexidine Gluconate Cloth  6 each Topical Q0600  . citalopram  20 mg Oral Daily  . clopidogrel  75 mg Oral Daily  . doxercalciferol  3 mcg  Intravenous Q M,W,F-HD  . ezetimibe  10 mg Oral Daily  . feeding supplement (NEPRO CARB STEADY)  237 mL Oral BID BM  . gabapentin  100 mg Oral QHS  . hydrALAZINE  25 mg Oral BID  . insulin aspart  0-15 Units Subcutaneous TID WC  . metoprolol succinate  25 mg Oral QHS  . pantoprazole  40 mg Oral Daily  . sodium chloride flush  3 mL Intravenous Q12H    Dialysis Orders: MWF -[facility] ([facility]) 3.75hrs, BFR400, PXT062, EDW 66.5kg,2K/2.25Ca, L AVF, heparin 4500 bolus - Mircera29mcg q2wks - last 03/28/18 - Hectorol40mcg IV qHD   Assessment/Plan: 1. NSTEMI w/known Hx CAD s/p CABG in 2017 &PCI to SVG-OM1: Trop elevated; EKG with ST depression. Taken urgently for LHC showing severe 3 vessel obstructive CAD - s/p PCI (7/17). Per cardiology. 2. Pulmonary edema: On initial CXR. ^ UF with HD. 3. Hyperkalemia: On admit, improved s/p HD. 4. ESRD: Continue per MWF schedule, next HD 04/04/18. K+ 4.3 today.  5. Hypertension/volume: HD yesterday on schedule. Pre wt 66.1 Net UF 2.5 Post wt 63.9. BP on higher side this AM. Continue lowering volume as tolerated, avoid hypotension.  6. Anemia of CKD: Hgb 9.7, not due for ESA yet. 7. Secondary Hyperparathyroidism: Ca good, Phos at goal as OP. Continue VDRA, Phoslo. 8. Nutrition - Renal diet with fluid restriction. Renavite.  9.  A fib - per primary. SR on monitor. 10. DMT2 - on insulin 11.  L Sided Weakness and Reduced responsiveness (7/19): Unclear cause. Head CT angiogram showed CT angiogram done showed right vertebral and right PICA occlusion-unsure if acute or chronic. MRI did not show acute CVA. Possible posterior circulation CVA-in setting of new PAF. Per primary, Neurology following. Appears  at baseline today.      Rasheed Welty H. Suhayb Anzalone NP-C 04/02/2018, 8:28 AM  [employer] [phone]

## 2018-04-02 NOTE — Progress Notes (Signed)
Called report to Jeneen Rinks on 4E, patient transferred with all belongings.

## 2018-04-03 LAB — BASIC METABOLIC PANEL
ANION GAP: 11 (ref 5–15)
BUN: 28 mg/dL — AB (ref 6–20)
CALCIUM: 9.2 mg/dL (ref 8.9–10.3)
CO2: 29 mmol/L (ref 22–32)
Chloride: 95 mmol/L — ABNORMAL LOW (ref 98–111)
Creatinine, Ser: 7.36 mg/dL — ABNORMAL HIGH (ref 0.61–1.24)
GFR calc Af Amer: 8 mL/min — ABNORMAL LOW (ref >60)
GFR, EST NON AFRICAN AMERICAN: 7 mL/min — AB (ref >60)
GLUCOSE: 207 mg/dL — AB (ref 70–99)
Potassium: 4.2 mmol/L (ref 3.5–5.1)
Sodium: 135 mmol/L (ref 135–145)

## 2018-04-03 LAB — GLUCOSE, CAPILLARY
Glucose-Capillary: 160 mg/dL — ABNORMAL HIGH (ref 70–99)
Glucose-Capillary: 161 mg/dL — ABNORMAL HIGH (ref 70–99)

## 2018-04-03 MED ORDER — HYDRALAZINE HCL 25 MG PO TABS
25.0000 mg | ORAL_TABLET | Freq: Three times a day (TID) | ORAL | Status: DC
  Administered 2018-04-03: 25 mg via ORAL
  Filled 2018-04-03: qty 1

## 2018-04-03 MED ORDER — METOPROLOL SUCCINATE ER 25 MG PO TB24: 25.0000 mg | ORAL_TABLET | Freq: Every day | ORAL | 3 refills | Status: DC

## 2018-04-03 MED ORDER — CLOPIDOGREL BISULFATE 75 MG PO TABS: 75.0000 mg | ORAL_TABLET | Freq: Every day | ORAL | 3 refills | Status: DC

## 2018-04-03 MED ORDER — APIXABAN 5 MG PO TABS: 5.0000 mg | ORAL_TABLET | Freq: Two times a day (BID) | ORAL | 0 refills | Status: DC

## 2018-04-03 MED ORDER — ISOSORBIDE MONONITRATE ER 60 MG PO TB24
60.0000 mg | ORAL_TABLET | Freq: Every day | ORAL | Status: DC
  Administered 2018-04-03: 60 mg via ORAL
  Filled 2018-04-03: qty 1

## 2018-04-03 MED ORDER — APIXABAN 5 MG PO TABS: 5.0000 mg | ORAL_TABLET | Freq: Two times a day (BID) | ORAL | 3 refills | Status: DC

## 2018-04-03 MED ORDER — HYDRALAZINE HCL 25 MG PO TABS: 25.0000 mg | ORAL_TABLET | Freq: Three times a day (TID) | ORAL | 5 refills | Status: DC

## 2018-04-03 NOTE — Progress Notes (Signed)
[facility] Progress Note   Subjective: Still C/O vague precordium pain that comes and goes but no pain right now. Up in chair, looks great. Hopefully discharge today.   Objective Vitals:   04/02/18 1956 04/03/18 0408 04/03/18 0813 04/03/18 0919  BP: (!) 164/66 (!) 172/73 (!) 177/66 (!) 141/70  Pulse: 65 62 66   Resp: 20 14 19    Temp: 98.3 F (36.8 C) 98.6 F (37 C) 98.6 F (37 C)   TempSrc: Oral Oral Oral   SpO2: 96% 94% 96%   Weight:      Height:       Physical Exam General: Pleasant, NAD Heart: S1,S2, RRR No M/R/G Lungs: Slightly decreased in bases otherwise CTAB Abdomen: Active BS Extremities: No LE edema Dialysis Access: LUA AVF + bruit  Additional Objective Labs: Basic Metabolic Panel: Recent Labs  Lab 04/02/18 0224 04/02/18 1314 04/03/18 0305  NA 135 135 135  K 4.3 4.7 4.2  CL 95* 95* 95*  CO2 29 31 29   GLUCOSE 229* 170* 207*  BUN 14 23* 28*  CREATININE 4.98* 6.05* 7.36*  CALCIUM 8.9 9.3 9.2  PHOS  --  2.3*  --    Liver Function Tests: Recent Labs  Lab 04/02/18 1314  ALBUMIN 3.3*   No results for input(s): LIPASE, AMYLASE in the last 168 hours. CBC: Recent Labs  Lab 03/30/18 0719 03/30/18 1206 03/31/18 0054  WBC 8.7 6.9 9.3  HGB 9.2* 8.6* 9.7*  HCT 28.6* 26.0* 29.5*  MCV 97.6 96.3 96.4  PLT 150 130* 151   Blood Culture    Component Value Date/Time   SDES  10/13/2017 1552    TOE LEFT FOURTH Performed at [facility], [place] [address] [place], [place] [address]    [facility]  10/13/2017 1552    NONE Performed at [facility], [address] [place], [place] [address]    CULT  10/13/2017 1552    ABUNDANT METHICILLIN RESISTANT STAPHYLOCOCCUS AUREUS   REPTSTATUS 10/16/2017 FINAL 10/13/2017 1552    Cardiac Enzymes: Recent Labs  Lab 03/30/18 1206 03/30/18 1841 03/31/18 0054  TROPONINI 0.11* 0.36* 1.13*   CBG: Recent Labs  Lab 04/01/18 2142 04/02/18 0603 04/02/18 1706 04/02/18 2123  04/03/18 0622  GLUCAP 257* 174* 264* 237* 160*   Iron Studies: No results for input(s): IRON, TIBC, TRANSFERRIN, FERRITIN in the last 72 hours. @lablastinr3 @ Studies/Results: No results found. Medications: . sodium chloride    . sodium chloride    . sodium chloride     . apixaban  5 mg Oral BID  . atorvastatin  80 mg Oral Daily  . calcium acetate  667 mg Oral TID WC  . Chlorhexidine Gluconate Cloth  6 each Topical Q0600  . citalopram  20 mg Oral Daily  . clopidogrel  75 mg Oral Daily  . doxercalciferol  3 mcg Intravenous Q M,W,F-HD  . ezetimibe  10 mg Oral Daily  . feeding supplement (NEPRO CARB STEADY)  237 mL Oral BID BM  . gabapentin  100 mg Oral QHS  . hydrALAZINE  25 mg Oral TID  . insulin aspart  0-15 Units Subcutaneous TID WC  . isosorbide mononitrate  60 mg Oral Daily  . metoprolol succinate  25 mg Oral QHS  . pantoprazole  40 mg Oral Daily  . sodium chloride flush  3 mL Intravenous Q12H     Dialysis Orders: MWF -[facility] ([facility]) 3.75hrs, BFR400, JXB147, EDW 66.5kg,2K/2.25Ca, L AVF, heparin 4500 bolus -Mircera85mcg q2wks - last  03/28/18 -Hectorol94mcg IV qHD   Assessment/Plan: 1. NSTEMI w/known Hx CAD s/p CABG in 2017 &PCI to SVG-OM1: Trop elevated; EKG with ST depression. Taken urgently [place] showing severe 3 vessel obstructive CAD- s/p PCI (7/17). Per cardiology. 2. Pulmonary edema: On initial CXR, resolved with HD. Lower EDW on DC. . 3. Hyperkalemia: On admit, improved s/p HD. 4. ESRD: Continue per MWF schedule, next HD 04/04/18 at [facility].   5. Hypertension/volume: HD yesterday on schedule. Pre wt 66.1 Net UF 2.5 Post wt 63.9. BP on higher side this AM. Continue lowering volume as tolerated, avoid hypotension. Will need lower EDW on DC.  6. Anemia of CKD: Hgb 9.7, not due for ESA yet. 7. Secondary Hyperparathyroidism: Ca good, Phos at goal as OP. Continue VDRA,Phoslo. 8. Nutrition - Renal diet with fluid restriction. Renavite.  9. A fib  - per primary. SR on monitor. 10. DMT2 - on insulin 11.  L Sided Weakness and Reduced responsiveness (7/19): Unclear cause. Head CT angiogram showed CT angiogram done showed right vertebral and right PICA occlusion-unsure if acute or chronic. MRI did not show acute CVA. Possible posterior circulation CVA-in setting of new PAF. Per primary, Neurology following. Appears at baseline today.     Kenneth [name] H. Laurence Crofford NP-C 04/03/2018, 10:33 AM  [employer] [phone]

## 2018-04-03 NOTE — Progress Notes (Addendum)
Progress Note  Patient Name: Kenneth [name] Date of Encounter: 04/03/2018  Primary Cardiologist: Loralie Champagne, MD   Subjective   Denies any CP or SOB. Some cough overnight.   Inpatient Medications    Scheduled Meds: . apixaban  5 mg Oral BID  . atorvastatin  80 mg Oral Daily  . calcium acetate  667 mg Oral TID WC  . Chlorhexidine Gluconate Cloth  6 each Topical Q0600  . citalopram  20 mg Oral Daily  . clopidogrel  75 mg Oral Daily  . doxercalciferol  3 mcg Intravenous Q M,W,F-HD  . ezetimibe  10 mg Oral Daily  . feeding supplement (NEPRO CARB STEADY)  237 mL Oral BID BM  . gabapentin  100 mg Oral QHS  . hydrALAZINE  25 mg Oral TID  . insulin aspart  0-15 Units Subcutaneous TID WC  . isosorbide mononitrate  60 mg Oral Daily  . metoprolol succinate  25 mg Oral QHS  . pantoprazole  40 mg Oral Daily  . sodium chloride flush  3 mL Intravenous Q12H   Continuous Infusions: . sodium chloride    . sodium chloride    . sodium chloride     PRN Meds: sodium chloride, sodium chloride, sodium chloride, acetaminophen, alteplase, guaiFENesin, heparin, heparin, lidocaine (PF), lidocaine-prilocaine, ondansetron (ZOFRAN) IV, pentafluoroprop-tetrafluoroeth, sodium chloride flush   Vital Signs    Vitals:   04/02/18 1956 04/03/18 0408 04/03/18 0813 04/03/18 0919  BP: (!) 164/66 (!) 172/73 (!) 177/66 (!) 141/70  Pulse: 65 62 66   Resp: 20 14 19    Temp: 98.3 F (36.8 C) 98.6 F (37 C) 98.6 F (37 C)   TempSrc: Oral Oral Oral   SpO2: 96% 94% 96%   Weight:      Height:       No intake or output data in the 24 hours ending 04/03/18 0940 Filed Weights   04/01/18 1400 04/01/18 1801 04/02/18 0420  Weight: 145 lb 11.6 oz (66.1 kg) 140 lb 3.4 oz (63.6 kg) 150 lb 12.7 oz (68.4 kg)    Telemetry    NSR without ventricular ectopy - Personally Reviewed  ECG    No new EKG - Personally Reviewed  Physical Exam   GEN: No acute distress.   Neck: No JVD Cardiac: RRR, no murmurs, rubs,  or gallops.  Respiratory: Clear to auscultation bilaterally. GI: Soft, nontender, non-distended  MS: No edema; No deformity. Neuro:  Nonfocal  Psych: Normal affect   Labs    Chemistry Recent Labs  Lab 04/02/18 0224 04/02/18 1314 04/03/18 0305  NA 135 135 135  K 4.3 4.7 4.2  CL 95* 95* 95*  CO2 29 31 29   GLUCOSE 229* 170* 207*  BUN 14 23* 28*  CREATININE 4.98* 6.05* 7.36*  CALCIUM 8.9 9.3 9.2  ALBUMIN  --  3.3*  --   GFRNONAA 11* 9* 7*  GFRAA 13* 11* 8*  ANIONGAP 11 9 11      Hematology Recent Labs  Lab 03/30/18 0719 03/30/18 1206 03/31/18 0054  WBC 8.7 6.9 9.3  RBC 2.93* 2.70* 3.06*  HGB 9.2* 8.6* 9.7*  HCT 28.6* 26.0* 29.5*  MCV 97.6 96.3 96.4  MCH 31.4 31.9 31.7  MCHC 32.2 33.1 32.9  RDW 14.3 14.5 14.5  PLT 150 130* 151    Cardiac Enzymes Recent Labs  Lab 03/30/18 1206 03/30/18 1841 03/31/18 0054  TROPONINI 0.11* 0.36* 1.13*    Recent Labs  Lab 03/30/18 0733  TROPIPOC 0.11*  BNPNo results for input(s): BNP, PROBNP in the last 168 hours.   DDimer No results for input(s): DDIMER in the last 168 hours.   Radiology    Mr Brain Wo Contrast  Result Date: 04/01/2018 CLINICAL DATA:  Left-sided weakness EXAM: MRI HEAD WITHOUT CONTRAST TECHNIQUE: Multiplanar, multiecho pulse sequences of the brain and surrounding structures were obtained without intravenous contrast. COMPARISON:  CT head 04/01/2018 FINDINGS: Limited imaging protocol per stroke neurology. Diffusion, FLAIR, and gradient echo imaging only performed. Negative for acute infarct. Mild chronic microvascular ischemic change in the white matter. Small chronic infarct in the left frontal cortex. Negative for hemorrhage or mass. Mild atrophy IMPRESSION: With negative for acute infarct. Mild chronic microvascular ischemic change. Electronically Signed   By: Franchot Gallo M.D.   On: 04/01/2018 10:05    Cardiac Studies   Cath 03/30/2018  Mid LM to Dist LM lesion is 30% stenosed.  Prox LAD  lesion is 95% stenosed.  Ost 1st Diag to 1st Diag lesion is 90% stenosed.  Ost 1st Mrg to 1st Mrg lesion is 90% stenosed.  Prox Cx to Mid Cx lesion is 85% stenosed.  Prox RCA to Mid RCA lesion is 100% stenosed.  SVG.  Origin to Prox Graft lesion is 100% stenosed.  Origin to Prox Graft lesion is 99% stenosed.  LIMA graft was visualized by angiography and is normal in caliber.  Insertion lesion is 50% stenosed.  Post intervention, there is a 0% residual stenosis.  A drug-eluting stent was successfully placed using a STENT SYNERGY DES 3X38.  A drug-eluting stent was successfully placed using a STENT SYNERGY DES 3X12.  LV end diastolic pressure is moderately elevated.   1. Severe 3 vessel obstructive CAD 2. Patent LIMA to the LAD. At the distal anastomosis the graft is angulated with a cork screw turn and there appears to be 50% eccentric narrowing. There is excellent TIMI 3 flow. This does not appear different from prior angiograms 3. Severe in stent restenosis of SVG to OM 4. Chronic occlusion of SVG to RCA 5. Moderately elevated LVEDP 6. Successful PCI with DES x 2 of native OM1.   Recommend uninterrupted dual antiplatelet therapy with Aspirin 81mg  daily and Clopidogrel 75mg  daily for a minimum of 12 months (ACS - Class I recommendation). I would treat residual CAD medically. He will need dialysis today.   Echo 03/31/2018 LV EF: 50% Study Conclusions  - Left ventricle: The cavity size was normal. Wall thickness was   increased in a pattern of moderate LVH. Systolic function was   normal. The estimated ejection fraction was 50%. Doppler   parameters are consistent with a reversible restrictive pattern,   indicative of decreased left ventricular diastolic compliance   and/or increased left atrial pressure (grade 3 diastolic   dysfunction). - Regional wall motion abnormality: Hypokinesis of the mid   inferoseptal, basal-mid inferior, and mid inferolateral    myocardium. - Aortic valve: Trileaflet; mildly thickened, mildly calcified   leaflets. - Mitral valve: There was mild regurgitation. - Right ventricle: Systolic function was mildly reduced. - Tricuspid valve: There was mild regurgitation. - Pulmonic valve: There was trivial regurgitation. - Pulmonary arteries: PA peak pressure: 41 mm Hg (S).  Impressions:  - Similar to prior study. EF estimated visually and with 3D echo.  Patient Profile     62 y.o. [name] with PMH of type II diabetes, CKD stage IV requiring HD for a period of time, CAD, and chronic systolic HF admitted for NSTEMI.  Assessment & Plan    1. CAD: NSTEMI. Severe in-stent restenosis of SVG-OM, s/p DES x 2 to native OM1             - continue ASA and plavix. ASA later stopped due to the need for systemic anticoagulation             - continue zetia and lipitor. Increasing Imdur to 60mg  daily. Ambulate with cardiac rehab today, ok to discharge.   - repeat EKG today prior to discharge.   2. PAF: noted postop CABG and again during this admission. Stated on eliquis 5mg  BID  3. Acute on chronic diastolic HF: euvolemic today, underwent HD on Friday  4. AMS: consulted neurology due to concern of TIA vs CVA. CTA head/neck with occluded right PICA and vertebral, but these do not explain symptoms. MRI head does not show CVA  5. ESRD: on HD. Last HD on Friday  6. Cough: unclear cause, lung clear. Recommend continue observation and followup with PCP.     For questions or updates, please contact [place] Please consult [email or web address] for contact info under Cardiology/STEMI.      Hilbert Corrigan, PA  04/03/2018, 9:40 AM     Patient seen and examined. Agree with assessment and plan. Fels much better today; no recurrent chest pain. Walking well; no dyspnea.  Maintaining sinus rhythm, on eliquis with PAF. Plan for DC today.  Resume outpatient dialysis at [facility] tomorrow.  F/U with Dr. Aundra Dubin.   Troy Sine, MD, [facility] 04/03/2018 10:46 AM

## 2018-04-03 NOTE — Discharge Instructions (Signed)

## 2018-04-03 NOTE — Discharge Summary (Addendum)
Discharge Summary    Patient ID: Kenneth [name],  MRN: [id number], DOB/AGE: 61-Apr-1957 61 y.o.  Admit date: 03/30/2018 Discharge date: 04/03/2018  Primary Care Provider: [name] Primary Cardiologist: Loralie Champagne, MD  Discharge Diagnoses    Principal Problem:   Non-ST elevation (NSTEMI) myocardial infarction [name]) Active Problems:   ESRD (end stage renal disease) ([place])   CAD (coronary artery disease)   Diabetes mellitus with complication ([place])   Chronic diastolic CHF (congestive heart failure) ([place])   Hyperlipidemia   NSTEMI (non-ST elevated myocardial infarction) ([place])   Status post coronary artery stent placement   Allergies No Known Allergies  Diagnostic Studies/Procedures    Cath 03/30/2018  Mid LM to Dist LM lesion is 30% stenosed.  Prox LAD lesion is 95% stenosed.  Ost 1st Diag to 1st Diag lesion is 90% stenosed.  Ost 1st Mrg to 1st Mrg lesion is 90% stenosed.  Prox Cx to Mid Cx lesion is 85% stenosed.  Prox RCA to Mid RCA lesion is 100% stenosed.  SVG.  Origin to Prox Graft lesion is 100% stenosed.  Origin to Prox Graft lesion is 99% stenosed.  LIMA graft was visualized by angiography and is normal in caliber.  Insertion lesion is 50% stenosed.  Post intervention, there is a 0% residual stenosis.  A drug-eluting stent was successfully placed using a STENT SYNERGY DES 3X38.  A drug-eluting stent was successfully placed using a STENT SYNERGY DES 3X12.  LV end diastolic pressure is moderately elevated.  1. Severe 3 vessel obstructive CAD 2. Patent LIMA to the LAD. At the distal anastomosis the graft is angulated with a cork screw turn and there appears to be 50% eccentric narrowing. There is excellent TIMI 3 flow. This does not appear different from prior angiograms 3. Severe in stent restenosis of SVG to OM 4. Chronic occlusion of SVG to RCA 5. Moderately elevated LVEDP 6. Successful PCI with DES x 2 of native OM1.   Recommend  uninterrupted dual antiplatelet therapy with Aspirin 81mg  daily and Clopidogrel 75mg  dailyfor a minimum of 12 months (ACS - Class I recommendation).I would treat residual CAD medically. He will need dialysis today.   Echo 03/31/2018 LV EF: 50% Study Conclusions  - Left ventricle: The cavity size was normal. Wall thickness was increased in a pattern of moderate LVH. Systolic function was normal. The estimated ejection fraction was 50%. Doppler parameters are consistent with a reversible restrictive pattern, indicative of decreased left ventricular diastolic compliance and/or increased left atrial pressure (grade 3 diastolic dysfunction). - Regional wall motion abnormality: Hypokinesis of the mid inferoseptal, basal-mid inferior, and mid inferolateral myocardium. - Aortic valve: Trileaflet; mildly thickened, mildly calcified leaflets. - Mitral valve: There was mild regurgitation. - Right ventricle: Systolic function was mildly reduced. - Tricuspid valve: There was mild regurgitation. - Pulmonic valve: There was trivial regurgitation. - Pulmonary arteries: PA peak pressure: 41 mm Hg (S).  Impressions:  - Similar to prior study. EF estimated visually and with 3D echo.  _____________   History of Present Illness     Kenneth [name] is a 62 y/o male with a h/o poorly controlled IDDM, HTN, HLD, ESRD on HD,  CAD and chronic systolic HF. He had a prolonged hospitalization from 05/31/16- 06/23/16. He initially presented with CP and dyspnea. He developed ARF/ cardiogenic shock requiring intubation and implantation of an IABP. Troponin was elevated ruling him in for NSTEMI. 2D echo showed reduced EF down to 35-40%. Subsequent LHC showed multivessel  CAD. Once stabilized, he underwent CABG x 3 per Dr. Nils Pyle on 06/05/16 w/LIMA-LAD, SVG-OM, SVG-RCA. He had post operative atrial fibrillation and required amiodarone. Also with worsening renal function and was placed on HD. He now  goes 3 x week on MWF. EF during that time was 35-40%. He required milrinone that hospitalization and was ultimately weaned off. EF ultimately improved back to normal, but he is still followed in the [facility] by Dr. Aundra Dubin.   He was readmitted in October 2018 with chest pain and pulmonary edema. He ruled in for NSTEMI. Echo showed preserved EF but new WMAs. Cath showed 95% ostial SVG-OM1 stenosis and underwent PCI w/ DES to the SVG-OM1.   He is now presents to the [facility] ED with a complaint of substernal CP that began while in the middle of HD this morning. Pt notes he has had symptoms off and on for the last 6-8 weeks, but progressively worsening. Worse over the last 2 days. He had severe pain today at HD. Pain feels somewhat similar to previous angina. Wife also notes productive cough, yellow sputum for several weeks and some subjective chills. CXR shows pulmonary vascular congestion and mild diffuse pulmonary edema. No PNA. WBC ct WNL.    Hospital Course     Consultants: Nephrology and neurology  Patient underwent cardiac catheterization on 03/30/2018 which showed chronically occluded SVG to RCA, severe in-stent restenosis to SVG to OM, patent LIMA to LAD.  Given the severe in-stent restenosis of SVG to OM, the decision was made to open up the native obtuse marginal branch.  Patient underwent 2 drug-eluting stents to the native OM1.  Postprocedure, patient required dialysis in the hospital on 04/01/2018.  He was maintained on aspirin and Plavix.  On 7/19, patient also had episode of altered mental status with left arm sensory changes and weakness with walking.  Code stroke was activated.  Patient was seen by neurology.  CT of head and neck showed occluded right PICA and vertebral, however it does not explain his symptoms.  MRI of the brain does not reveal any acute CVA.  His symptoms spontaneously resolved afterward.  Due to brief episodes of recurrent atrial fibrillation during this hospitalization, he was  started on Eliquis 5 mg twice daily.  Aspirin was removed.  Imdur was later added for antianginal purposes.  Patient was seen on 04/03/2018, at which time he is able to ambulate in the hallway without any recurrent chest discomfort or shortness of breath.  He is deemed stable for discharge from cardiology perspective.  He does have some dry cough, however there is no fever and his pulmonary exam is negative.  I recommended the patient to continue observation and follow-up with his primary care provider if coughing does not improve.  Otherwise, I will arrange 1 week transition of care follow-up for the patient.  _____________  Discharge Vitals Blood pressure (!) 141/70, pulse 66, temperature 98.6 F (37 C), temperature source Oral, resp. rate 19, height 5\' 6"  (1.676 m), weight 150 lb 12.7 oz (68.4 kg), SpO2 96 %.  Filed Weights   04/01/18 1400 04/01/18 1801 04/02/18 0420  Weight: 145 lb 11.6 oz (66.1 kg) 140 lb 3.4 oz (63.6 kg) 150 lb 12.7 oz (68.4 kg)    Labs & Radiologic Studies    CBC No results for input(s): WBC, NEUTROABS, HGB, HCT, MCV, PLT in the last 72 hours. Basic Metabolic Panel Recent Labs    04/02/18 1314 04/03/18 0305  NA 135 135  K 4.7  4.2  CL 95* 95*  CO2 31 29  GLUCOSE 170* 207*  BUN 23* 28*  CREATININE 6.05* 7.36*  CALCIUM 9.3 9.2  PHOS 2.3*  --    Liver Function Tests Recent Labs    04/02/18 1314  ALBUMIN 3.3*   No results for input(s): LIPASE, AMYLASE in the last 72 hours. Cardiac Enzymes No results for input(s): CKTOTAL, CKMB, CKMBINDEX, TROPONINI in the last 72 hours. BNP Invalid input(s): POCBNP D-Dimer No results for input(s): DDIMER in the last 72 hours. Hemoglobin A1C No results for input(s): HGBA1C in the last 72 hours. Fasting Lipid Panel No results for input(s): CHOL, HDL, LDLCALC, TRIG, CHOLHDL, LDLDIRECT in the last 72 hours. Thyroid Function Tests No results for input(s): TSH, T4TOTAL, T3FREE, THYROIDAB in the last 72 hours.  Invalid  input(s): FREET3 _____________  Ct Angio Head W Or Wo Contrast  Result Date: 04/01/2018 CLINICAL DATA:  Left-sided weakness. Coronary stent intervention 03/30/2018 EXAM: CT ANGIOGRAPHY HEAD AND NECK TECHNIQUE: Multidetector CT imaging of the head and neck was performed using the standard protocol during bolus administration of intravenous contrast. Multiplanar CT image reconstructions and MIPs were obtained to evaluate the vascular anatomy. Carotid stenosis measurements (when applicable) are obtained utilizing NASCET criteria, using the distal internal carotid diameter as the denominator. CONTRAST:  38mL ISOVUE-370 IOPAMIDOL (ISOVUE-370) INJECTION 76% COMPARISON:  CT head 04/01/2018 FINDINGS: CTA NECK FINDINGS Aortic arch: Atherosclerotic aortic arch and proximal great vessels. Proximal great vessels widely patent. Right carotid system: Right carotid system widely patent with minimal scattered atherosclerotic disease. No significant stenosis. Left carotid system: Atherosclerotic plaque at the left carotid bifurcation. 25% diameter stenosis proximal left internal carotid artery Vertebral arteries: Right vertebral artery is occluded at the origin and occluded to the distal intracranial segment where there is trickle flow. Left vertebral artery widely patent. Nonobstructing plaque at the origin. Skeleton: Cervical spondylosis.  No acute skeletal abnormality. Other neck: Negative for mass or adenopathy in the neck. Upper chest: Moderate right pleural effusion and mild left pleural effusion. Pulmonary edema. Review of the MIP images confirms the above findings CTA HEAD FINDINGS Anterior circulation: Atherosclerotic disease throughout the cavernous carotid bilaterally with moderate stenosis bilaterally. Anterior and middle cerebral arteries patent bilaterally without stenosis or aneurysm Posterior circulation: Occluded right vertebral artery with trickle flow distally. Right PICA occluded. Left vertebral artery and  left PICA patent. Basilar patent. Small AICA patent bilaterally. Superior cerebellar and posterior cerebral arteries normal bilaterally. Fetal origin posterior cerebral artery bilaterally. Venous sinuses: Patent Anatomic variants: None Delayed phase: Not performed Review of the MIP images confirms the above findings IMPRESSION: 25% diameter proximal left internal carotid artery. Right internal carotid artery widely patent in the neck. 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 Right PICA appears occluded. Bilateral pleural effusions and pulmonary edema secondary to congestive heart failure. The study was reviewed with Dr.Arora Electronically Signed   By: Franchot Gallo M.D.   On: 04/01/2018 09:41   Ct Angio Neck W Or Wo Contrast  Result Date: 04/01/2018 CLINICAL DATA:  Left-sided weakness. Coronary stent intervention 03/30/2018 EXAM: CT ANGIOGRAPHY HEAD AND NECK TECHNIQUE: Multidetector CT imaging of the head and neck was performed using the standard protocol during bolus administration of intravenous contrast. Multiplanar CT image reconstructions and MIPs were obtained to evaluate the vascular anatomy. Carotid stenosis measurements (when applicable) are obtained utilizing NASCET criteria, using the distal internal carotid diameter as the denominator. CONTRAST:  56mL ISOVUE-370  IOPAMIDOL (ISOVUE-370) INJECTION 76% COMPARISON:  CT head 04/01/2018 FINDINGS: CTA NECK FINDINGS Aortic arch: Atherosclerotic aortic arch and proximal great vessels. Proximal great vessels widely patent. Right carotid system: Right carotid system widely patent with minimal scattered atherosclerotic disease. No significant stenosis. Left carotid system: Atherosclerotic plaque at the left carotid bifurcation. 25% diameter stenosis proximal left internal carotid artery Vertebral arteries: Right vertebral artery is occluded at the origin and occluded to  the distal intracranial segment where there is trickle flow. Left vertebral artery widely patent. Nonobstructing plaque at the origin. Skeleton: Cervical spondylosis.  No acute skeletal abnormality. Other neck: Negative for mass or adenopathy in the neck. Upper chest: Moderate right pleural effusion and mild left pleural effusion. Pulmonary edema. Review of the MIP images confirms the above findings CTA HEAD FINDINGS Anterior circulation: Atherosclerotic disease throughout the cavernous carotid bilaterally with moderate stenosis bilaterally. Anterior and middle cerebral arteries patent bilaterally without stenosis or aneurysm Posterior circulation: Occluded right vertebral artery with trickle flow distally. Right PICA occluded. Left vertebral artery and left PICA patent. Basilar patent. Small AICA patent bilaterally. Superior cerebellar and posterior cerebral arteries normal bilaterally. Fetal origin posterior cerebral artery bilaterally. Venous sinuses: Patent Anatomic variants: None Delayed phase: Not performed Review of the MIP images confirms the above findings IMPRESSION: 25% diameter proximal left internal carotid artery. Right internal carotid artery widely patent in the neck. 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 Right PICA appears occluded. Bilateral pleural effusions and pulmonary edema secondary to congestive heart failure. The study was reviewed with Dr.Arora Electronically Signed   By: Franchot Gallo M.D.   On: 04/01/2018 09:41   Mr Brain Wo Contrast  Result Date: 04/01/2018 CLINICAL DATA:  Left-sided weakness EXAM: MRI HEAD WITHOUT CONTRAST TECHNIQUE: Multiplanar, multiecho pulse sequences of the brain and surrounding structures were obtained without intravenous contrast. COMPARISON:  CT head 04/01/2018 FINDINGS: Limited imaging protocol per stroke neurology. Diffusion, FLAIR, and gradient echo imaging  only performed. Negative for acute infarct. Mild chronic microvascular ischemic change in the white matter. Small chronic infarct in the left frontal cortex. Negative for hemorrhage or mass. Mild atrophy IMPRESSION: With negative for acute infarct. Mild chronic microvascular ischemic change. Electronically Signed   By: Franchot Gallo M.D.   On: 04/01/2018 10:05   Dg Chest Port 1 View  Result Date: 03/30/2018 CLINICAL DATA:  Chest pain today at dialysis EXAM: PORTABLE CHEST 1 VIEW COMPARISON:  12/04/2017 FINDINGS: Enlargement of cardiac silhouette post CABG. Pulmonary vascular congestion. Atherosclerotic calcifications aorta. Interstitial infiltrates likely pulmonary edema. Tiny RIGHT pleural effusion. Chronic RIGHT apical scarring. Bones demineralized. IMPRESSION: Enlargement of cardiac silhouette post CABG with pulmonary vascular congestion and mild diffuse pulmonary edema. Electronically Signed   By: Lavonia Dana M.D.   On: 03/30/2018 08:07   Ct Head Code Stroke Wo Contrast  Result Date: 04/01/2018 CLINICAL DATA:  Code stroke. Left-sided weakness beginning at 0810 hours. EXAM: CT HEAD WITHOUT CONTRAST TECHNIQUE: Contiguous axial images were obtained from the base of the skull through the vertex without intravenous contrast. COMPARISON:  07/05/2017 FINDINGS: Brain: Normal appearance without evidence of atrophy, old or acute infarction, mass lesion, hemorrhage hydrocephalus or extra-axial collection. Vascular: There is atherosclerotic calcification of the major vessels at the base of the brain. Skull: Negative Sinuses/Orbits: Clear/normal Other: None ASPECTS ([place] Stroke Program Early CT Score) - Ganglionic level infarction (caudate, lentiform nuclei, internal capsule, insula, M1-M3 cortex): 7 - Supraganglionic infarction (M4-M6 cortex): 3  Total score (0-10 with 10 being normal): 10 IMPRESSION: 1. Negative head CT. 2. ASPECTS is 10. 3. These results were communicated to at 9:26 amon 7/19/2019by text page  via the [facility] messaging system. Electronically Signed   By: Nelson Chimes M.D.   On: 04/01/2018 09:27   Disposition   Pt is being discharged home today in good condition.  Follow-up Plans & Appointments    Follow-up Information    Larey Dresser, MD Follow up.   Specialty:  Cardiology Why:  Cardiology office will contact you to arrange outpatient visit, please give [place] a call if you do not hear from [place] in 3 business days Contact information: [place] [address] [phone]          Discharge Instructions    Amb Referral to Cardiac Rehabilitation   Complete by:  As directed    Diagnosis:   Coronary Stents NSTEMI     Diet - low sodium heart healthy   Complete by:  As directed    Discharge instructions   Complete by:  As directed    No driving for 48 hours. No lifting over 5 lbs for 1 week. No sexual activity for 1 week. Keep procedure site clean & dry. If you notice increased pain, swelling, bleeding or pus, call/return!  You may shower, but no soaking baths/hot tubs/pools for 1 week.   Increase activity slowly   Complete by:  As directed       Discharge Medications   Allergies as of 04/03/2018   No Known Allergies     Medication List    STOP taking these medications   aspirin 81 MG chewable tablet   losartan 100 MG tablet Commonly known as:  COZAAR   losartan 50 MG tablet Commonly known as:  COZAAR     TAKE these medications   acetaminophen 500 MG tablet Commonly known as:  TYLENOL Take 500 mg by mouth every 6 (six) hours as needed for mild pain.   apixaban 5 MG Tabs tablet Commonly known as:  ELIQUIS Take 1 tablet (5 mg total) by mouth 2 (two) times daily.   atorvastatin 80 MG tablet Commonly known as:  LIPITOR TAKE 1 TABLET BY MOUTH ONCE DAILY   calcium acetate 667 MG capsule Commonly known as:  PHOSLO Take 667 mg by mouth 3 (three) times daily with meals.   citalopram 20 MG tablet Commonly known as:  CELEXA Take 20 mg by  mouth daily.   clopidogrel 75 MG tablet Commonly known as:  PLAVIX Take 1 tablet (75 mg total) by mouth daily. Start taking on:  04/04/2018   ezetimibe 10 MG tablet Commonly known as:  ZETIA TAKE 1 TABLET BY MOUTH ONCE DAILY   gabapentin 100 MG capsule Commonly known as:  NEURONTIN Take 100 mg by mouth at bedtime.   hydrALAZINE 25 MG tablet Commonly known as:  APRESOLINE Take 1 tablet (25 mg total) by mouth 3 (three) times daily. What changed:  when to take this   insulin pump Soln Inject into the skin continuous. NOVOLOG   isosorbide mononitrate 60 MG 24 hr tablet Commonly known as:  IMDUR TAKE 1 TABLET BY MOUTH ONCE DAILY   metoprolol succinate 25 MG 24 hr tablet Commonly known as:  TOPROL-XL Take 1 tablet (25 mg total) by mouth at bedtime.   pantoprazole 40 MG tablet Commonly known as:  PROTONIX Take 40 mg by mouth daily.        Acute coronary syndrome (  MI, NSTEMI, STEMI, etc) this admission?: Yes.     AHA/ACC Clinical Performance & Quality Measures: 1. Aspirin prescribed? - Yes 2. ADP Receptor Inhibitor (Plavix/Clopidogrel, Brilinta/Ticagrelor or Effient/Prasugrel) prescribed (includes medically managed patients)? - Yes 3. Beta Blocker prescribed? - Yes 4. High Intensity Statin (Lipitor 40-80mg  or Crestor 20-40mg ) prescribed? - Yes 5. EF assessed during THIS hospitalization? - Yes 6. For EF <40%, was ACEI/ARB prescribed? - Not Applicable (EF >/= 60%) 7. For EF <40%, Aldosterone Antagonist (Spironolactone or Eplerenone) prescribed? - Not Applicable (EF >/= 16%) 8. Cardiac Rehab Phase II ordered (Included Medically managed Patients)? - Yes     Outstanding Labs/Studies   None  Duration of Discharge Encounter   Greater than 30 minutes including physician time.  Hilbert Corrigan, PA 04/03/2018, 12:07 PM

## 2018-04-03 NOTE — Progress Notes (Signed)
Pt discharged, IV removed, Vital signs obtained (stable), pt education performed regarding AVS, medications, and follow-up doctor's visits.

## 2018-04-04 ENCOUNTER — Telehealth (HOSPITAL_COMMUNITY): Payer: Self-pay | Admitting: Vascular Surgery

## 2018-04-04 NOTE — Telephone Encounter (Signed)
Left pt message giving post hosp appt w/ app/ np 04/18/18 @ 10 am

## 2018-04-05 ENCOUNTER — Telehealth (HOSPITAL_COMMUNITY): Payer: Self-pay

## 2018-04-05 NOTE — Telephone Encounter (Signed)
Referral recv'd, patient refused service per phrase I.  Closed referral

## 2018-04-18 ENCOUNTER — Encounter (HOSPITAL_COMMUNITY): Payer: Medicare Other

## 2018-04-18 NOTE — Progress Notes (Signed)
Advanced Heart Failure Clinic Note  PCP: Dr. Tasia Catchings [facility]) Cardiology: Dr. Demetra Shiner is a 62 y.o. 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  He was admitted in 9/17 with NSTEMI.  LHC showed 3VD and he had CABG x 3.  He had CKD stage IV when he was admitted and developed ESRD requiring HD while in the hospital.  He had transient atrial fibrillation post-CABG.  He developed cardiogenic shock requiring milrinone but was able to taper off.  Echo in 9/17 showed EF 35-40%.    Admitted 8/1 through 04/16/2017 with malignant hypertension due to medication noncompliance. Continues on HD.  HF M-W-F. Discharge weight 140 pounds.   Admitted 10/18 with chest pain and pulmonary edema. NSTEMI noted on admit. Echo showed preserved EF but + WMAs. Cath with 95% ostial SVG-OM1 stenosis with TIMI 2 flow in graft.  Patient had DES to SVG-OM1.   Admitted in 12/18 with atypical chest pain, suspect not cardiac.   H presents today for post hospital follow up. Treated for NSTEMI with DES x 2 as below. He is feeling 60% better than prior to admission. He continues to have occasional chest discomfort, but never more than 2-3 out of scale of 10. ( He specifically states his pain has never gotten to a 5/6 out of 10 like prior to recent admission). Breathing is OK. His BP has been running high. He denies neuro symptoms but at times has "bad" occipital headaches. No exertional dyspnea. His Toprol was discontinue during recent admission (presumably due to his TIA/CVA) and his hydralazine has also been BID instead of TID.   Cath 03/30/2018  Mid LM to Dist LM lesion is 30% stenosed.  Prox LAD lesion is 95% stenosed.  Ost 1st Diag to 1st Diag lesion is 90% stenosed.  Ost 1st Mrg to 1st Mrg lesion is 90% stenosed.  Prox Cx to Mid Cx lesion is 85% stenosed.  Prox RCA to Mid RCA lesion is 100%  stenosed.  SVG.  Origin to Prox Graft lesion is 100% stenosed.  Origin to Prox Graft lesion is 99% stenosed.  LIMA graft was visualized by angiography and is normal in caliber.  Insertion lesion is 50% stenosed.  Post intervention, there is a 0% residual stenosis.  A drug-eluting stent was successfully placed using a STENT SYNERGY DES 3X38.  A drug-eluting stent was successfully placed using a STENT SYNERGY DES 3X12.  LV end diastolic pressure is moderately elevated.  1. Severe 3 vessel obstructive CAD 2. Patent LIMA to the LAD. At the distal anastomosis the graft is angulated with a cork screw turn and there appears to be 50% eccentric narrowing. There is excellent TIMI 3 flow. This does not appear different from prior angiograms 3. Severe in stent restenosis of SVG to OM 4. Chronic occlusion of SVG to RCA 5. Moderately elevated LVEDP 6. Successful PCI with DES x 2 of native OM1.   PMH: 1. Type II diabetes 2. HTN 3. ESRD 4. CAD: NSTEMI 9/17 with CABG x 3 (LIMA-LAD, SVG-OM, SVG-RCA).   - NSTEMI 10/18 s/p DES to ostial SVG and OM 5. Chronic systolic CHF: Ischemic cardiomyopathy.   - Echo (9/17) with EF 35-40%, grade II diastolic dysfunction.  - Echo (1/18) with EF 60-65%, grade II diastolic dysfunction. - Echo (10/18) with EF 55%, Akinesis of the basal-midinferolateral, inferior, and inferoseptal myocardiam. Akinesis of the basal  anteroseptal myocardium.  6. Atrial fibrillation: Paroxysmal.  Only noted post-op CABG.  7. ABIs normal in 12/17. 5/18 ABIs normal.  8. Carotid stenosis: Carotid dopplers (12/18) with < 50% right common carotid stenosis, 1-61% LICA stenosis.   SH: Married, lives in [place], 1 son, nonsmoker, no ETOH, speaks only [place].   FH: No premature CAD.  Review of systems complete and found to be negative unless listed in HPI.    Current Outpatient Medications  Medication Sig Dispense Refill  . acetaminophen (TYLENOL) 500 MG tablet Take 500 mg by mouth  every 6 (six) hours as needed for mild pain.    Marland Kitchen apixaban (ELIQUIS) 5 MG TABS tablet Take 1 tablet (5 mg total) by mouth 2 (two) times daily. 180 tablet 3  . aspirin EC 81 MG tablet Take 81 mg by mouth daily.    Marland Kitchen atorvastatin (LIPITOR) 80 MG tablet TAKE 1 TABLET BY MOUTH ONCE DAILY 30 tablet 11  . calcium acetate (PHOSLO) 667 MG capsule Take 667 mg by mouth 3 (three) times daily with meals.     . citalopram (CELEXA) 20 MG tablet Take 20 mg by mouth daily.  1  . ezetimibe (ZETIA) 10 MG tablet TAKE 1 TABLET BY MOUTH ONCE DAILY 90 tablet 3  . gabapentin (NEURONTIN) 100 MG capsule Take 100 mg by mouth at bedtime.   0  . hydrALAZINE (APRESOLINE) 25 MG tablet Take 1 tablet (25 mg total) by mouth 3 (three) times daily. 90 tablet 5  . Insulin Human (INSULIN PUMP) SOLN Inject into the skin continuous. NOVOLOG    . isosorbide mononitrate (IMDUR) 60 MG 24 hr tablet TAKE 1 TABLET BY MOUTH ONCE DAILY 30 tablet 3  . losartan (COZAAR) 50 MG tablet Take 50 mg by mouth daily.    . pantoprazole (PROTONIX) 40 MG tablet Take 40 mg by mouth daily.   0  . clopidogrel (PLAVIX) 75 MG tablet Take 1 tablet (75 mg total) by mouth daily. (Patient not taking: Reported on 04/19/2018) 90 tablet 3  . metoprolol succinate (TOPROL-XL) 25 MG 24 hr tablet Take 1 tablet (25 mg total) by mouth at bedtime. (Patient not taking: Reported on 04/19/2018) 90 tablet 3   No current facility-administered medications for this encounter.    Vitals:   04/19/18 1515  BP: (!) 182/80  Pulse: 86  SpO2: 98%  Weight: 146 lb 3.2 oz (66.3 kg)    Wt Readings from Last 3 Encounters:  04/19/18 146 lb 3.2 oz (66.3 kg)  04/02/18 150 lb 12.7 oz (68.4 kg)  12/04/17 140 lb (63.5 kg)   General: Well appearing. No resp difficulty. HEENT: Normal Neck: Supple. JVP 7-8 cm. Carotids 2+ bilat; Left carotid bruit. No thyromegaly or nodule noted. Cor: PMI nondisplaced. RRR, 1/6 SEM RUSB.  Lungs: CTAB, normal effort. Abdomen: Soft, non-tender,  non-distended, no HSM. No bruits or masses. +BS  Extremities: No cyanosis, clubbing, or rash. R and LLE no edema.  Neuro: Alert & orientedx3, cranial nerves grossly intact. moves all 4 extremities w/o difficulty. Affect pleasant   Assessment/Plan:  1. CAD: S/p NSTEMI.   - Severe in-stent restenosis in SVG-OM was culprit, now s/p DES x 2 to native OM1.   - No exertional chest pain.  - Continue ASA 81 and Plavix for now.  Ultimately, given afib seen this admission and TIA vs stroke, he needs to be anticoagulated. - Continue Eliquis 5 mg BID.  - Continue imdur 60 mg daily.  - Continue Plavix for now.  -  Continue Zetia and atorvastatin.  2. Chronic chronic diastolic CHF:  - Echo 05/13/93 LVEF 50% - Volume status stable today.  3. ESRD:  - Per renal. On HD.  4. Atrial fibrillation:  - Noted post-op CABG and again for about an hour during previous admission.  - Started on Eliquis 5 mg BID based on TIA versus CVA as below.  5. Neuro:  - By CTA head 04/01/18 - Thought to be small CVA missed by MRI versus TIA. CTA head/neck with occluded right PICA and vertebral, but these do not explain symptoms.  MRI head does not show CVA.  - He is now anticoagulated.  6. Hyperlipidemia:  - Continue Zetia + atorvastatin. Good lipids 12/18.   7. PAD:  - Healing ulcer on left foot, hard to feel pulses in right foot. - Normal arterial dopplers 10/14/17  8. L Carotid bruit:  - Mild carotid disease, repeat carotid dopplers in 12/19.  9. Hypertensive Urgency - Increase hydralazine to 50 mg TID - Add back Toprol 25 mg daily.   HTN elevated today. Have written note for them to check before HD Friday and call our office with the reading. Stressed importance of BP control and med compliance, especially in setting of recent TIA vs small CVA. RTC 2 weeks. Sooner with worsening symptoms.   Shirley Friar, PA-C  04/19/2018  Greater than 50% of the 30 minute visit was spent in counseling/coordination of care  regarding disease state education, salt/fluid restriction, sliding scale diuretics, and medication compliance.

## 2018-04-19 ENCOUNTER — Ambulatory Visit (HOSPITAL_COMMUNITY)
Admission: RE | Admit: 2018-04-19 | Discharge: 2018-04-19 | Disposition: A | Discharge status: Home / Self Care | Payer: Medicare Other | Source: Ambulatory Visit | Attending: Cardiology | Admitting: Cardiology

## 2018-04-19 ENCOUNTER — Encounter (HOSPITAL_COMMUNITY): Payer: Self-pay

## 2018-04-19 VITALS — BP 182/80 | HR 86 | Wt 146.2 lb

## 2018-04-19 DIAGNOSIS — Z7982 Long term (current) use of aspirin: Secondary | ICD-10-CM | POA: Diagnosis not present

## 2018-04-19 DIAGNOSIS — I480 Paroxysmal atrial fibrillation: Secondary | ICD-10-CM | POA: Diagnosis not present

## 2018-04-19 DIAGNOSIS — E785 Hyperlipidemia, unspecified: Secondary | ICD-10-CM | POA: Diagnosis not present

## 2018-04-19 DIAGNOSIS — I5032 Chronic diastolic (congestive) heart failure: Secondary | ICD-10-CM

## 2018-04-19 DIAGNOSIS — I2510 Atherosclerotic heart disease of native coronary artery without angina pectoris: Secondary | ICD-10-CM | POA: Diagnosis not present

## 2018-04-19 DIAGNOSIS — I110 Hypertensive heart disease with heart failure: Secondary | ICD-10-CM | POA: Diagnosis not present

## 2018-04-19 DIAGNOSIS — N186 End stage renal disease: Secondary | ICD-10-CM | POA: Diagnosis not present

## 2018-04-19 DIAGNOSIS — I132 Hypertensive heart and chronic kidney disease with heart failure and with stage 5 chronic kidney disease, or end stage renal disease: Secondary | ICD-10-CM | POA: Insufficient documentation

## 2018-04-19 DIAGNOSIS — I160 Hypertensive urgency: Secondary | ICD-10-CM | POA: Insufficient documentation

## 2018-04-19 DIAGNOSIS — Z7902 Long term (current) use of antithrombotics/antiplatelets: Secondary | ICD-10-CM | POA: Insufficient documentation

## 2018-04-19 DIAGNOSIS — I252 Old myocardial infarction: Secondary | ICD-10-CM | POA: Insufficient documentation

## 2018-04-19 DIAGNOSIS — Z79899 Other long term (current) drug therapy: Secondary | ICD-10-CM | POA: Insufficient documentation

## 2018-04-19 DIAGNOSIS — E1122 Type 2 diabetes mellitus with diabetic chronic kidney disease: Secondary | ICD-10-CM | POA: Insufficient documentation

## 2018-04-19 DIAGNOSIS — Z9114 Patient's other noncompliance with medication regimen: Secondary | ICD-10-CM | POA: Insufficient documentation

## 2018-04-19 DIAGNOSIS — I255 Ischemic cardiomyopathy: Secondary | ICD-10-CM | POA: Insufficient documentation

## 2018-04-19 DIAGNOSIS — I6523 Occlusion and stenosis of bilateral carotid arteries: Secondary | ICD-10-CM | POA: Diagnosis not present

## 2018-04-19 DIAGNOSIS — I739 Peripheral vascular disease, unspecified: Secondary | ICD-10-CM | POA: Insufficient documentation

## 2018-04-19 DIAGNOSIS — Z7901 Long term (current) use of anticoagulants: Secondary | ICD-10-CM | POA: Diagnosis not present

## 2018-04-19 DIAGNOSIS — R0989 Other specified symptoms and signs involving the circulatory and respiratory systems: Secondary | ICD-10-CM | POA: Insufficient documentation

## 2018-04-19 DIAGNOSIS — R0789 Other chest pain: Secondary | ICD-10-CM | POA: Diagnosis not present

## 2018-04-19 DIAGNOSIS — Z951 Presence of aortocoronary bypass graft: Secondary | ICD-10-CM | POA: Insufficient documentation

## 2018-04-19 DIAGNOSIS — E118 Type 2 diabetes mellitus with unspecified complications: Secondary | ICD-10-CM

## 2018-04-19 DIAGNOSIS — Z794 Long term (current) use of insulin: Secondary | ICD-10-CM | POA: Diagnosis not present

## 2018-04-19 DIAGNOSIS — I214 Non-ST elevation (NSTEMI) myocardial infarction: Secondary | ICD-10-CM

## 2018-04-19 DIAGNOSIS — I5030 Unspecified diastolic (congestive) heart failure: Secondary | ICD-10-CM

## 2018-04-19 MED ORDER — HYDRALAZINE HCL 50 MG PO TABS: 50.0000 mg | ORAL_TABLET | Freq: Three times a day (TID) | ORAL | 3 refills | Status: DC

## 2018-04-19 MED ORDER — METOPROLOL SUCCINATE ER 25 MG PO TB24: 25.0000 mg | ORAL_TABLET | Freq: Every day | ORAL | 3 refills | Status: DC

## 2018-04-19 NOTE — Patient Instructions (Signed)
START Toprol XL 25 mg tablet once daily.  INCREASE Hydralazine to 50 mg three times daily.  Follow up 2 weeks with Oda Kilts PA-C.  ___________________________________________________________________________ [place] Code:  1500  Follow up 2 months with Dr. Aundra Dubin  ____________________________________________________________________________ [place] Roberts Code: 5831  Take all medication as prescribed the day of your appointment. Bring all medications with you to your appointment.  Do the following things EVERYDAY: 1) Weigh yourself in the morning before breakfast. Write it down and keep it in a log. 2) Take your medicines as prescribed 3) Eat low salt foods-Limit salt (sodium) to 2000 mg per day.  4) Stay as active as you can everyday 5) Limit all fluids for the day to less than 2 liters

## 2018-04-22 ENCOUNTER — Telehealth (HOSPITAL_COMMUNITY): Payer: Self-pay

## 2018-04-22 NOTE — Telephone Encounter (Signed)
Amy called from [facility] to report pt BP. Standing 179/120 and sitting 195/87.

## 2018-04-22 NOTE — Telephone Encounter (Signed)
Please have patient increase Hydralazine to 75 mg TID.    Legrand Como [address] [place], PA-C 04/22/2018 8:50 AM

## 2018-04-22 NOTE — Telephone Encounter (Signed)
Spoke with Kenneth [name] at [facility]. She will notify pt of the change.

## 2018-05-02 ENCOUNTER — Other Ambulatory Visit: Payer: Self-pay | Admitting: Physician Assistant

## 2018-05-04 ENCOUNTER — Ambulatory Visit (HOSPITAL_COMMUNITY)
Admission: RE | Admit: 2018-05-04 | Discharge: 2018-05-04 | Disposition: A | Discharge status: Home / Self Care | Payer: Medicare Other | Source: Ambulatory Visit | Attending: Internal Medicine | Admitting: Internal Medicine

## 2018-05-04 VITALS — BP 132/60 | HR 58 | Wt 139.2 lb

## 2018-05-04 DIAGNOSIS — Z79899 Other long term (current) drug therapy: Secondary | ICD-10-CM | POA: Insufficient documentation

## 2018-05-04 DIAGNOSIS — Z7982 Long term (current) use of aspirin: Secondary | ICD-10-CM | POA: Diagnosis not present

## 2018-05-04 DIAGNOSIS — Z794 Long term (current) use of insulin: Secondary | ICD-10-CM | POA: Diagnosis not present

## 2018-05-04 DIAGNOSIS — I4891 Unspecified atrial fibrillation: Secondary | ICD-10-CM | POA: Diagnosis not present

## 2018-05-04 DIAGNOSIS — E1122 Type 2 diabetes mellitus with diabetic chronic kidney disease: Secondary | ICD-10-CM | POA: Diagnosis not present

## 2018-05-04 DIAGNOSIS — I110 Hypertensive heart disease with heart failure: Secondary | ICD-10-CM

## 2018-05-04 DIAGNOSIS — Z992 Dependence on renal dialysis: Secondary | ICD-10-CM | POA: Insufficient documentation

## 2018-05-04 DIAGNOSIS — Z7902 Long term (current) use of antithrombotics/antiplatelets: Secondary | ICD-10-CM | POA: Diagnosis not present

## 2018-05-04 DIAGNOSIS — I252 Old myocardial infarction: Secondary | ICD-10-CM | POA: Diagnosis not present

## 2018-05-04 DIAGNOSIS — I132 Hypertensive heart and chronic kidney disease with heart failure and with stage 5 chronic kidney disease, or end stage renal disease: Secondary | ICD-10-CM | POA: Insufficient documentation

## 2018-05-04 DIAGNOSIS — E785 Hyperlipidemia, unspecified: Secondary | ICD-10-CM | POA: Diagnosis not present

## 2018-05-04 DIAGNOSIS — Z7901 Long term (current) use of anticoagulants: Secondary | ICD-10-CM | POA: Insufficient documentation

## 2018-05-04 DIAGNOSIS — Z951 Presence of aortocoronary bypass graft: Secondary | ICD-10-CM | POA: Insufficient documentation

## 2018-05-04 DIAGNOSIS — I5032 Chronic diastolic (congestive) heart failure: Secondary | ICD-10-CM | POA: Diagnosis not present

## 2018-05-04 DIAGNOSIS — I5030 Unspecified diastolic (congestive) heart failure: Secondary | ICD-10-CM

## 2018-05-04 DIAGNOSIS — I2510 Atherosclerotic heart disease of native coronary artery without angina pectoris: Secondary | ICD-10-CM | POA: Insufficient documentation

## 2018-05-04 DIAGNOSIS — R0989 Other specified symptoms and signs involving the circulatory and respiratory systems: Secondary | ICD-10-CM | POA: Diagnosis not present

## 2018-05-04 DIAGNOSIS — N186 End stage renal disease: Secondary | ICD-10-CM | POA: Diagnosis not present

## 2018-05-04 LAB — BASIC METABOLIC PANEL
Anion gap: 10 (ref 5–15)
BUN: 12 mg/dL (ref 6–20)
CHLORIDE: 95 mmol/L — AB (ref 98–111)
CO2: 35 mmol/L — ABNORMAL HIGH (ref 22–32)
Calcium: 9.2 mg/dL (ref 8.9–10.3)
Creatinine, Ser: 4.82 mg/dL — ABNORMAL HIGH (ref 0.61–1.24)
GFR, EST AFRICAN AMERICAN: 14 mL/min — AB (ref >60)
GFR, EST NON AFRICAN AMERICAN: 12 mL/min — AB (ref >60)
Glucose, Bld: 154 mg/dL — ABNORMAL HIGH (ref 70–99)
POTASSIUM: 4.4 mmol/L (ref 3.5–5.1)
SODIUM: 140 mmol/L (ref 135–145)

## 2018-05-04 LAB — BRAIN NATRIURETIC PEPTIDE: B NATRIURETIC PEPTIDE 5: 1423.3 pg/mL — AB (ref 0.0–100.0)

## 2018-05-04 NOTE — Progress Notes (Addendum)
Advanced Heart Failure Clinic Note  PCP: Dr. 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  He was admitted in 9/17 with NSTEMI.  LHC showed 3VD and he had CABG x 3.  He had CKD stage IV when he was admitted and developed ESRD requiring HD while in the hospital.  He had transient atrial fibrillation post-CABG.  He developed cardiogenic shock requiring milrinone but was able to taper off.  Echo in 9/17 showed EF 35-40%.    Admitted 8/1 through 04/16/2017 with malignant hypertension due to medication noncompliance. Continues on HD.  HF M-W-F. Discharge weight 140 pounds.   Admitted 10/18 with chest pain and pulmonary edema. NSTEMI noted on admit. Echo showed preserved EF but + WMAs. Cath with 95% ostial SVG-OM1 stenosis with TIMI 2 flow in graft.  Patient had DES to SVG-OM1.   Admitted in 12/18 with atypical chest pain, suspect not cardiac.    Admitted 7/21/201 with chest pain.Treated for NSTEMI. Had [place] with DES x 2 as below. Post cath he had AMS and Code Stroke was called.  Patient was seen by neurology.  CT of head and neck showed occluded right PICA and vertebral, however it does not explain his symptoms.  MRI of the brain does not reveal any acute CVA.  His symptoms spontaneously resolved afterward.  Due to brief episodes of recurrent atrial fibrillation during this hospitalization, he was started on Eliquis 5 mg twice daily.He was discharged plavix and eliquis. He was not on aspirin at discharge.     Today he returns for HF follow up. Overall feeling tired. 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 Has ongoing fatigue. Not walking much because he says he gets blisters on feet. He is not interested in cardiac rehab.  Denies PND/Orthopnea. Appetite ok. No fever or chills.  Taking all medications and has been  taking aspirin but this was stopped in his last hospitalization.   Cath 03/30/2018  Mid LM to Dist LM lesion is 30% stenosed.  Prox LAD lesion is 95% stenosed.  Ost 1st Diag to 1st Diag lesion is 90% stenosed.  Ost 1st Mrg to 1st Mrg lesion is 90% stenosed.  Prox Cx to Mid Cx lesion is 85% stenosed.  Prox RCA to Mid RCA lesion is 100% stenosed.  SVG.  Origin to Prox Graft lesion is 100% stenosed.  Origin to Prox Graft lesion is 99% stenosed.  LIMA graft was visualized by angiography and is normal in caliber.  Insertion lesion is 50% stenosed.  Post intervention, there is a 0% residual stenosis.  A drug-eluting stent was successfully placed using a STENT SYNERGY DES 3X38.  A drug-eluting stent was successfully placed using a STENT SYNERGY DES 3X12.  LV end diastolic pressure is moderately elevated.  1. Severe 3 vessel obstructive CAD 2. Patent LIMA to the LAD. At the distal anastomosis the graft is angulated with a cork screw turn and there appears to be 50% eccentric narrowing. There is excellent TIMI 3 flow. This does not appear different from prior angiograms 3. Severe in stent restenosis of SVG to OM 4. Chronic occlusion of SVG to RCA 5. Moderately elevated LVEDP 6. Successful PCI with DES x 2 of native OM1.   PMH: 1. Type II diabetes 2. HTN 3. ESRD 4. CAD: NSTEMI 9/17 with CABG x  3 (LIMA-LAD, SVG-OM, SVG-RCA).   - NSTEMI 10/18 s/p DES to ostial SVG and OM 5. Chronic systolic CHF: Ischemic cardiomyopathy.   - Echo (9/17) with EF 35-40%, grade II diastolic dysfunction.  - Echo (1/18) with EF 60-65%, grade II diastolic dysfunction. - Echo (10/18) with EF 55%, Akinesis of the basal-midinferolateral, inferior, and inferoseptal myocardiam. Akinesis of the basal anteroseptal myocardium.  6. Atrial fibrillation: Paroxysmal.  Only noted post-op CABG.  7. ABIs normal in 12/17. 5/18 ABIs normal.  8. Carotid stenosis: Carotid dopplers (12/18) with < 50% right common  carotid stenosis, 2-70% LICA stenosis.   SH: Married, lives in [place], 1 son, nonsmoker, no ETOH, speaks only [place].   FH: No premature CAD.  Review of systems complete and found to be negative unless listed in HPI.    Current Outpatient Medications  Medication Sig Dispense Refill  . acetaminophen (TYLENOL) 500 MG tablet Take 500 mg by mouth every 6 (six) hours as needed for mild pain.    Marland Kitchen aspirin EC 81 MG tablet Take 81 mg by mouth daily.    Marland Kitchen atorvastatin (LIPITOR) 80 MG tablet TAKE 1 TABLET BY MOUTH ONCE DAILY 30 tablet 11  . calcium acetate (PHOSLO) 667 MG capsule Take 667 mg by mouth 3 (three) times daily with meals.     . citalopram (CELEXA) 20 MG tablet Take 20 mg by mouth daily.  1  . ELIQUIS 5 MG TABS tablet TAKE 1 TABLET BY MOUTH TWICE DAILY 180 tablet 1  . ezetimibe (ZETIA) 10 MG tablet TAKE 1 TABLET BY MOUTH ONCE DAILY 90 tablet 3  . gabapentin (NEURONTIN) 100 MG capsule Take 100 mg by mouth at bedtime.   0  . hydrALAZINE (APRESOLINE) 50 MG tablet Take 1 tablet (50 mg total) by mouth 3 (three) times daily. 90 tablet 3  . Insulin Human (INSULIN PUMP) SOLN Inject into the skin continuous. NOVOLOG    . isosorbide mononitrate (IMDUR) 60 MG 24 hr tablet TAKE 1 TABLET BY MOUTH ONCE DAILY 30 tablet 3  . losartan (COZAAR) 50 MG tablet Take 50 mg by mouth daily.    . metoprolol succinate (TOPROL-XL) 25 MG 24 hr tablet Take 1 tablet (25 mg total) by mouth at bedtime. 90 tablet 3  . pantoprazole (PROTONIX) 40 MG tablet Take 40 mg by mouth daily.   0  . clopidogrel (PLAVIX) 75 MG tablet Take 1 tablet (75 mg total) by mouth daily. (Patient not taking: Reported on 04/19/2018) 90 tablet 3   No current facility-administered medications for this encounter.    Vitals:   05/04/18 1516  BP: 132/60  Pulse: (!) 58  SpO2: 96%  Weight: 63.1 kg (139 lb 3.2 oz)    Wt Readings from Last 3 Encounters:  05/04/18 63.1 kg (139 lb 3.2 oz)  04/19/18 66.3 kg (146 lb 3.2 oz)  04/02/18 68.4 kg (150 lb  12.7 oz)  Interpreter present.  General:  Well appearing. No resp difficulty HEENT: normal Neck: supple. no JVD. Carotids 2+ bilat; no bruits. No lymphadenopathy or thryomegaly appreciated. Cor: PMI nondisplaced. Regular rate & rhythm. No rubs, gallops. 1/6 SEM RUSB.  Lungs: clear Abdomen: soft, nontender, nondistended. No hepatosplenomegaly. No bruits or masses. Good bowel sounds. Extremities: no cyanosis, clubbing, rash, edema. RUE AVR with dressing in place. Blood drainage noted on the dressing.   Neuro: alert & orientedx3, cranial nerves grossly intact. moves all 4 extremities w/o difficulty. Affect flat  Assessment/Plan:  1. CAD: S/p NSTEMI.   - 03/30/2018 Severe  in-stent restenosis in SVG-OM was culprit, now s/p DES x 2 to native OM1.   - No chest pain. He does not want to start cardiac rehab.   - Per Dr Kelly--> Stop aspirin. Continue Plavix and eliquis.    - Continue Eliquis 5 mg BID.  - Continue imdur 60 mg daily.  - Continue Plavix.   - Continue Zetia and atorvastatin.  2. Chronic chronic diastolic CHF:  - Echo 0/34/91 LVEF 50% -Volume status managed with HD>  3. ESRD:  On HD 3 days a week.  4. Atrial fibrillation:  - Noted post-op CABG and again for about an hour during previous admission.  - Regular pulse.  - Continue Eliquis 5 mg BID based on TIA versus CVA as below.  5. Neuro:  - By CTA head 04/01/18 - Thought to be small CVA missed by MRI versus TIA. CTA head/neck with occluded right PICA and vertebral, but these do not explain symptoms.  MRI head does not show CVA.  - He is now anticoagulated.  6. Hyperlipidemia:  - Continue Zetia + atorvastatin. Good lipids 12/18.   7. PAD:  -  Normal arterial dopplers 10/14/17  8. L Carotid bruit:  - Mild carotid disease, repeat carotid dopplers in 12/19.  9. HTN -Better controlled. Continue current regimen.   Follow up in  With Dr Aundra Dubin in 2 months. Will call him and instruct him to stop aspirin per Dr Claiborne Billings. He will remain  on Plavix and Eliquis.   Darrick Grinder, NP  05/04/2018

## 2018-05-04 NOTE — Patient Instructions (Signed)
No changes today.  Keep follow up with Dr. Aundra Dubin as scheduled.

## 2018-05-05 ENCOUNTER — Telehealth (HOSPITAL_COMMUNITY): Payer: Self-pay | Admitting: *Deleted

## 2018-05-05 NOTE — Addendum Note (Signed)
Encounter addended by: Conrad Buckland, NP on: 05/05/2018 7:57 AM  Actions taken: Sign clinical note

## 2018-05-05 NOTE — Telephone Encounter (Signed)
Per Darrick Grinder, NP patient needs to stop aspirin.  Called and spoke with wife and she verbalized understanding and will have him stop taking aspirin.  Medication discontinued.

## 2018-07-05 ENCOUNTER — Ambulatory Visit (HOSPITAL_COMMUNITY)
Admission: RE | Admit: 2018-07-05 | Discharge: 2018-07-05 | Disposition: A | Discharge status: Home / Self Care | Payer: Medicare Other | Source: Ambulatory Visit | Attending: Cardiology | Admitting: Cardiology

## 2018-07-05 VITALS — BP 110/50 | HR 68 | Wt 145.6 lb

## 2018-07-05 DIAGNOSIS — I252 Old myocardial infarction: Secondary | ICD-10-CM | POA: Insufficient documentation

## 2018-07-05 DIAGNOSIS — R0989 Other specified symptoms and signs involving the circulatory and respiratory systems: Secondary | ICD-10-CM | POA: Diagnosis not present

## 2018-07-05 DIAGNOSIS — Z7902 Long term (current) use of antithrombotics/antiplatelets: Secondary | ICD-10-CM | POA: Diagnosis not present

## 2018-07-05 DIAGNOSIS — Z951 Presence of aortocoronary bypass graft: Secondary | ICD-10-CM | POA: Diagnosis not present

## 2018-07-05 DIAGNOSIS — Z7901 Long term (current) use of anticoagulants: Secondary | ICD-10-CM | POA: Diagnosis not present

## 2018-07-05 DIAGNOSIS — E1122 Type 2 diabetes mellitus with diabetic chronic kidney disease: Secondary | ICD-10-CM | POA: Diagnosis not present

## 2018-07-05 DIAGNOSIS — Z992 Dependence on renal dialysis: Secondary | ICD-10-CM | POA: Diagnosis not present

## 2018-07-05 DIAGNOSIS — N186 End stage renal disease: Secondary | ICD-10-CM | POA: Insufficient documentation

## 2018-07-05 DIAGNOSIS — I255 Ischemic cardiomyopathy: Secondary | ICD-10-CM | POA: Insufficient documentation

## 2018-07-05 DIAGNOSIS — N189 Chronic kidney disease, unspecified: Secondary | ICD-10-CM

## 2018-07-05 DIAGNOSIS — Z79899 Other long term (current) drug therapy: Secondary | ICD-10-CM | POA: Insufficient documentation

## 2018-07-05 DIAGNOSIS — E785 Hyperlipidemia, unspecified: Secondary | ICD-10-CM | POA: Diagnosis not present

## 2018-07-05 DIAGNOSIS — I5032 Chronic diastolic (congestive) heart failure: Secondary | ICD-10-CM | POA: Diagnosis not present

## 2018-07-05 DIAGNOSIS — Z955 Presence of coronary angioplasty implant and graft: Secondary | ICD-10-CM | POA: Diagnosis not present

## 2018-07-05 DIAGNOSIS — I2510 Atherosclerotic heart disease of native coronary artery without angina pectoris: Secondary | ICD-10-CM | POA: Insufficient documentation

## 2018-07-05 DIAGNOSIS — I480 Paroxysmal atrial fibrillation: Secondary | ICD-10-CM | POA: Insufficient documentation

## 2018-07-05 DIAGNOSIS — I6523 Occlusion and stenosis of bilateral carotid arteries: Secondary | ICD-10-CM

## 2018-07-05 DIAGNOSIS — D631 Anemia in chronic kidney disease: Secondary | ICD-10-CM

## 2018-07-05 DIAGNOSIS — I132 Hypertensive heart and chronic kidney disease with heart failure and with stage 5 chronic kidney disease, or end stage renal disease: Secondary | ICD-10-CM | POA: Diagnosis not present

## 2018-07-05 DIAGNOSIS — I5042 Chronic combined systolic (congestive) and diastolic (congestive) heart failure: Secondary | ICD-10-CM | POA: Insufficient documentation

## 2018-07-05 LAB — CBC
HCT: 33.3 % — ABNORMAL LOW (ref 39.0–52.0)
Hemoglobin: 10.8 g/dL — ABNORMAL LOW (ref 13.0–17.0)
MCH: 31.4 pg (ref 26.0–34.0)
MCHC: 32.4 g/dL (ref 30.0–36.0)
MCV: 96.8 fL (ref 80.0–100.0)
PLATELETS: 154 10*3/uL (ref 150–400)
RBC: 3.44 MIL/uL — ABNORMAL LOW (ref 4.22–5.81)
RDW: 13.8 % (ref 11.5–15.5)
WBC: 6.9 10*3/uL (ref 4.0–10.5)
nRBC: 0 % (ref 0.0–0.2)

## 2018-07-05 MED ORDER — ATORVASTATIN CALCIUM 80 MG PO TABS: 80.0000 mg | ORAL_TABLET | Freq: Every day | ORAL | 3 refills | Status: DC

## 2018-07-05 NOTE — Progress Notes (Signed)
PCP: Dr. Tasia Catchings [facility]) Cardiology: Dr. Aundra Dubin  62 y.o. 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  He was admitted in 9/17 with NSTEMI.  LHC showed 3VD and he had CABG x 3.  He had CKD stage IV when he was admitted and developed ESRD requiring HD while in the hospital.  He had transient atrial fibrillation post-CABG.  He developed cardiogenic shock requiring milrinone but was able to taper off.  Echo in 9/17 showed EF 35-40%.    Admitted 8/1 through 04/16/2017 with malignant hypertension due to medication noncompliance. Continues on HD.  HF M-W-F. Discharge weight 140 pounds.   Admitted 10/18 with chest pain and pulmonary edema. NSTEMI noted on admit. Echo showed preserved EF but + WMAs. Cath with 95% ostial SVG-OM1 stenosis with TIMI 2 flow in graft.  Patient had DES to SVG-OM1.   Admitted in 12/18 with atypical chest pain, suspect not cardiac.   NSTEMI in 7/19 with severe in-stent restenosis in SVG-OM1, had DES x 2 to native OM1.  Echo showed EF 50%.  Atrial fibrillation was noted this admission.   He presents today for followup of CHF and CAD.  He has occasional fleeting chest pain, nothing exertional.  He continues to get HD regularly.  No lightheadedness with HD.  Generalized fatigue.  +Cough.  Occasioanl short of breath if he walks more than 100 yards.  Rare palpitations.    Labs (12/18): LDL 64, HDL 31  ECG: NSR, LVH with repolarization abnormality, inferior Qs (same as prior)  PMH: 1. Type II diabetes 2. HTN 3. ESRD 4. CAD: NSTEMI 9/17 with CABG x 3 (LIMA-LAD, SVG-OM, SVG-RCA).   - NSTEMI 10/18 s/p DES to ostial SVG-OM - NSTEMI 7/19: severe in-stent restenosis in SVG-OM, chronic total occlusion of SVG-RCA.  DES x 2 to native OM1.  5. Chronic systolic CHF: Ischemic cardiomyopathy.   - Echo (9/17) with EF 35-40%, grade II diastolic dysfunction.  - Echo (1/18) with EF 60-65%,  grade II diastolic dysfunction. - Echo (10/18) with EF 55%, Akinesis of the basal-midinferolateral, inferior, and inferoseptal myocardiam. Akinesis of the basal anteroseptal myocardium.  - Echo (7/19): EF 50%, moderate LVH, mildly decreased RV systolic function.  6. Atrial fibrillation: Paroxysmal.  Only noted post-op CABG.  7. ABIs normal in 12/17. 5/18 ABIs normal.  1/19 ABIs normal.  8. Carotid stenosis: Carotid dopplers (12/18) with < 50% right common carotid stenosis, 2-20% LICA stenosis.  9. Atrial fibrillation: Paroxysmal.  10. Cerebrovascular disease: Occluded right PICA and vertebral on 7/19 CTA   SH: Married, lives in [place], 1 son, nonsmoker, no ETOH, speaks only [place].   FH: No premature CAD.  Review of systems complete and found to be negative unless listed in HPI.    Current Outpatient Medications  Medication Sig Dispense Refill  . acetaminophen (TYLENOL) 500 MG tablet Take 500 mg by mouth every 6 (six) hours as needed for mild pain.    . calcium acetate (PHOSLO) 667 MG capsule Take 667 mg by mouth 3 (three) times daily with meals.     . citalopram (CELEXA) 20 MG tablet Take 20 mg by mouth daily.  1  . clopidogrel (PLAVIX) 75 MG tablet Take 1 tablet (75 mg total) by mouth daily. 90 tablet 3  . ELIQUIS 5 MG TABS tablet TAKE 1 TABLET BY MOUTH TWICE DAILY 180 tablet 1  . ezetimibe (ZETIA) 10 MG tablet TAKE 1  TABLET BY MOUTH ONCE DAILY 90 tablet 3  . gabapentin (NEURONTIN) 100 MG capsule Take 100 mg by mouth at bedtime.   0  . hydrALAZINE (APRESOLINE) 50 MG tablet Take 1 tablet (50 mg total) by mouth 3 (three) times daily. 90 tablet 3  . Insulin Human (INSULIN PUMP) SOLN Inject into the skin continuous. NOVOLOG    . isosorbide mononitrate (IMDUR) 60 MG 24 hr tablet TAKE 1 TABLET BY MOUTH ONCE DAILY 30 tablet 3  . losartan (COZAAR) 50 MG tablet Take 50 mg by mouth daily.    . metoprolol succinate (TOPROL-XL) 25 MG 24 hr tablet Take 1 tablet (25 mg total) by mouth at bedtime. 90  tablet 3  . pantoprazole (PROTONIX) 40 MG tablet Take 40 mg by mouth daily.   0  . atorvastatin (LIPITOR) 80 MG tablet Take 1 tablet (80 mg total) by mouth daily. 90 tablet 3   No current facility-administered medications for this encounter.    Vitals:   07/05/18 1503  BP: (!) 110/50  Pulse: 68  SpO2: 98%  Weight: 66 kg (145 lb 9.6 oz)   Wt Readings from Last 3 Encounters:  07/05/18 66 kg (145 lb 9.6 oz)  05/04/18 63.1 kg (139 lb 3.2 oz)  04/19/18 66.3 kg (146 lb 3.2 oz)   General: NAD Neck: No JVD, no thyromegaly or thyroid nodule.  Lungs: Clear to auscultation bilaterally with normal respiratory effort. CV: Nondisplaced PMI.  Heart regular S1/S2, no S3/S4, 1/6 SEM RUSB.  No peripheral edema.  Right carotid bruit.  Normal pedal pulses.  Abdomen: Soft, nontender, no hepatosplenomegaly, no distention.  Skin: Intact without lesions or rashes.  Neurologic: Alert and oriented x 3.  Psych: Normal affect. Extremities: No clubbing or cyanosis.  HEENT: Normal.   Assessment/Plan: 1. CAD: s/p CABG.  NSTEMI 10/18, cath with 95% ostial SVG-OM1 stenosis with TIMI 2 flow in graft, s/p DES to SVG-OM1 07/07/17. NSTEMI 7/19 with in-stent restenosis in the SVG-OM1, had DES x 2 to native OM1.    - Off ASA given Eliquis use.   - Continue Plavix minimum of 6 months, ideally 1 year.     - Continue Imdur.  - Restart statin (atorvastatin 80 mg daily).  2. Hyperlipidemia: Continue Zetia.  He has been off atorvastatin, need to restart at prior dose 80 mg daily.  Lipids/LFTs in 2 months.     3. Chronic diastolic CHF:  Ischemic cardiomyopathy.  EF low in past but improved after CABG. Most recent echo 7/19 with EF 50%.    - On dialysis for volume control.  - Continue Toprol XL 25 mg at bed time.  - Continue losaran  50 mg daily.   4. Atrial fibrillation: Paroxysmal, NSR today.  He will remain on Eliquis.  CBC today.   5. HTN: BP controlled.  6. ESRD.  7. Carotid bruit: Mild carotid disease, repeat  carotid dopplers in 12/19.   Loralie Champagne 07/05/2018

## 2018-07-05 NOTE — Patient Instructions (Signed)
Restart Atorvastatin 80 mg daily  Your physician has requested that you have a carotid duplex. This test is an ultrasound of the carotid arteries in your neck. It looks at blood flow through these arteries that supply the brain with blood. Allow one hour for this exam. There are no restrictions or special instructions.  THIS WILL BE DONE AT [facility], THEY WILL CALL YOU TO SCHEDULE  Your physician recommends that you schedule a follow-up appointment in: 4 months

## 2018-07-07 ENCOUNTER — Other Ambulatory Visit (HOSPITAL_COMMUNITY): Payer: Self-pay | Admitting: Cardiology

## 2018-08-15 ENCOUNTER — Other Ambulatory Visit (HOSPITAL_COMMUNITY): Payer: Self-pay | Admitting: Student

## 2018-08-26 ENCOUNTER — Other Ambulatory Visit: Payer: Self-pay

## 2018-08-26 ENCOUNTER — Encounter (HOSPITAL_COMMUNITY): Payer: Self-pay | Admitting: Emergency Medicine

## 2018-08-26 ENCOUNTER — Emergency Department (HOSPITAL_COMMUNITY)
Admission: EM | Admit: 2018-08-26 | Discharge: 2018-08-26 | Disposition: A | Discharge status: Home / Self Care | Payer: Medicare Other | Attending: Emergency Medicine | Admitting: Emergency Medicine

## 2018-08-26 DIAGNOSIS — I2510 Atherosclerotic heart disease of native coronary artery without angina pectoris: Secondary | ICD-10-CM | POA: Diagnosis not present

## 2018-08-26 DIAGNOSIS — Z992 Dependence on renal dialysis: Secondary | ICD-10-CM | POA: Diagnosis not present

## 2018-08-26 DIAGNOSIS — N186 End stage renal disease: Secondary | ICD-10-CM | POA: Insufficient documentation

## 2018-08-26 DIAGNOSIS — Z87891 Personal history of nicotine dependence: Secondary | ICD-10-CM | POA: Insufficient documentation

## 2018-08-26 DIAGNOSIS — I120 Hypertensive chronic kidney disease with stage 5 chronic kidney disease or end stage renal disease: Secondary | ICD-10-CM | POA: Insufficient documentation

## 2018-08-26 DIAGNOSIS — E1122 Type 2 diabetes mellitus with diabetic chronic kidney disease: Secondary | ICD-10-CM | POA: Diagnosis not present

## 2018-08-26 DIAGNOSIS — Z7901 Long term (current) use of anticoagulants: Secondary | ICD-10-CM | POA: Diagnosis not present

## 2018-08-26 DIAGNOSIS — Z794 Long term (current) use of insulin: Secondary | ICD-10-CM | POA: Diagnosis not present

## 2018-08-26 DIAGNOSIS — Y712 Prosthetic and other implants, materials and accessory cardiovascular devices associated with adverse incidents: Secondary | ICD-10-CM | POA: Diagnosis not present

## 2018-08-26 DIAGNOSIS — T829XXA Unspecified complication of cardiac and vascular prosthetic device, implant and graft, initial encounter: Secondary | ICD-10-CM

## 2018-08-26 DIAGNOSIS — Z951 Presence of aortocoronary bypass graft: Secondary | ICD-10-CM | POA: Diagnosis not present

## 2018-08-26 DIAGNOSIS — I252 Old myocardial infarction: Secondary | ICD-10-CM | POA: Insufficient documentation

## 2018-08-26 DIAGNOSIS — Z79899 Other long term (current) drug therapy: Secondary | ICD-10-CM | POA: Diagnosis not present

## 2018-08-26 NOTE — ED Provider Notes (Signed)
[facility] EMERGENCY DEPARTMENT Provider Note   CSN: [id number] Arrival date & time: 08/26/18  1305     History   Chief Complaint Chief Complaint  Patient presents with  . Vascular Access Problem    HPI Kenneth [name] is a 62 y.o. male with a PMHx of ESRD on dialysis MWF, CAD, DM2, HTN, and other conditions listed below, who presents to the ED with complaints of bleeding fistula after dialysis.  Patient states that he finished dialysis around 11 AM, but his LUE fistula continued bleeding.  Clamp was placed but after 1 hour the bleeding had not stopped so they sent him here.  It has now been 2 hours and the bleeding seems to have stopped.  No known aggravating factors.  The patient is on Eliquis per chart review, patient states that he is on many medications and cannot recall the names of them but believes he is still on that medication.  He denies any lightheadedness, syncope, CP, S OB, abdominal pain, nausea, vomiting, numbness, tingling, focal weakness, pain anywhere, or any other complaints at this time.  The history is provided by the patient and medical records. No language interpreter was used.    Past Medical History:  Diagnosis Date  . Acute pulmonary edema ([place]) 05/31/2016  . Acute respiratory failure with hypoxemia ([place]) 05/31/2016  . CAD (coronary artery disease) 10/30/2016   NSTEMI 9/17 with CABG x 3 (LIMA-LAD, SVG-OM, SVG-RCA).   - NSTEMI 10/18 s/p DES to ostial SVG to  OM  . Diabetic foot ulcer ([place]) 04/11/2017  . 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 ESRD (end stage renal disease) on dialysis [facility])    "MWF; [place]" (03/30/2018)  . Gastroesophageal reflux disease 08/18/2016  . HCAP (healthcare-associated pneumonia)   . Hemodialysis status ([place])   . Hypertensive heart disease 09/12/2017  . Ischemic rest pain of lower extremity ([place]) 02/06/2015  . 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 NSTEMI (non-ST elevated myocardial  infarction) ([place])   . Pleural effusion   . 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 Status post coronary artery stent placement   . NSTEMI (non-ST elevated myocardial infarction) ([place]) 03/30/2018  . Hypertensive heart disease 09/12/2017  . Hyperlipidemia 09/12/2017  . Hyperkalemia 07/20/2017  . Chronic diastolic CHF (congestive heart failure) ([place]) 04/14/2017  . Anemia, chronic renal failure 04/11/2017  . Diabetic foot ulcer ([place]) 04/11/2017  . Cellulitis in diabetic foot ([place]) 01/18/2017  . Chest pain 12/16/2016  . Pressure injury of skin 11/03/2016  . Diabetes mellitus with complication ([place]) 89/38/1017  . ESRD (end stage renal disease) ([place]) 10/30/2016  . CAD (coronary artery disease) 10/30/2016  . Gastroesophageal reflux disease 08/18/2016  . Non-ST elevation (NSTEMI) myocardial infarction ([place])   . 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 Ischemic rest pain of lower extremity ([place]) 02/06/2015    Past Surgical History:  Procedure Laterality Date  . AV FISTULA PLACEMENT Left 06/22/2016   Procedure: ARTERIOVENOUS (AV) FISTULA CREATION LEFT UPPER ARM;  Surgeon: Rosetta Posner, MD;  Location: [place] [facility] [place];  Service: Vascular;  Laterality: Left;  . CARDIAC CATHETERIZATION N/A 05/31/2016   Procedure: Left Heart Cath and Coronary Angiography;  Surgeon: Jettie Booze, MD;  Location: [place] CV LAB;  Service: Cardiovascular;  Laterality: N/A;  . CARDIAC CATHETERIZATION N/A 05/31/2016   Procedure: Right  Heart Cath;  Surgeon: Jettie Booze, MD;  Location: [place] CV LAB;  Service: Cardiovascular;  Laterality: N/A;  . CARDIAC CATHETERIZATION N/A 05/31/2016   Procedure: IABP Insertion;  Surgeon: Jettie Booze, MD;  Location: [place] CV LAB;  Service: Cardiovascular;  Laterality: N/A;  . CORONARY ARTERY BYPASS GRAFT N/A  06/05/2016   Procedure: CORONARY ARTERY BYPASS GRAFTING (CABG) x3 LIMA to LAD -SVG to OM -SVG to RCA;  Surgeon: Ivin Poot, MD;  Location: [facility];  Service: Open Heart Surgery;  Laterality: N/A;  . CORONARY STENT INTERVENTION N/A 07/07/2017   Procedure: CORONARY STENT INTERVENTION;  Surgeon: Wellington Hampshire, MD;  Location: [place] [facility] CV LAB;  Service: Cardiovascular;  Laterality: N/A;  . CORONARY STENT INTERVENTION N/A 03/30/2018   Procedure: CORONARY STENT INTERVENTION;  Surgeon: Martinique, Peter M, MD;  Location: [place] CV LAB;  Service: Cardiovascular;  Laterality: N/A;  . INSERTION OF DIALYSIS CATHETER N/A 06/05/2016   Procedure: INSERTION OF DIALYSIS/trialysis CATHETER;  Surgeon: Ivin Poot, MD;  Location: [place];  Service: Vascular;  Laterality: N/A;  . INSERTION OF DIALYSIS CATHETER Right 06/13/2016   Procedure: INSERTION OF DIALYSIS CATHETER RIGHT INTERNAL JUGULAR;  Surgeon: Conrad [place], MD;  Location: [place];  Service: Vascular;  Laterality: Right;  . INTRAOPERATIVE TRANSESOPHAGEAL ECHOCARDIOGRAM N/A 06/05/2016   Procedure: INTRAOPERATIVE TRANSESOPHAGEAL ECHOCARDIOGRAM;  Surgeon: Ivin Poot, MD;  Location: [facility];  Service: Open Heart Surgery;  Laterality: N/A;  . LEFT HEART CATH AND CORS/GRAFTS ANGIOGRAPHY N/A 11/03/2016   Procedure: Left Heart Cath and Cors/Grafts Angiography;  Surgeon: Troy Sine, MD;  Location: [facility] CV LAB;  Service: Cardiovascular;  Laterality: N/A;  . LEFT HEART CATH AND CORS/GRAFTS ANGIOGRAPHY N/A 07/07/2017   Procedure: LEFT HEART CATH AND CORS/GRAFTS ANGIOGRAPHY;  Surgeon: Larey Dresser, MD;  Location: [place] CV LAB;  Service: Cardiovascular;  Laterality: N/A;  . LEFT HEART CATH AND CORS/GRAFTS ANGIOGRAPHY N/A 03/30/2018   Procedure: LEFT HEART CATH AND CORS/GRAFTS ANGIOGRAPHY;  Surgeon: Martinique, Peter M, MD;  Location: [facility] LAB;  Service: Cardiovascular;  Laterality: N/A;        Home Medications    Prior to Admission  medications   Medication Sig Start Date End Date Taking? Authorizing Provider  acetaminophen (TYLENOL) 500 MG tablet Take 500 mg by mouth every 6 (six) hours as needed for mild pain.    [provider]  atorvastatin (LIPITOR) 80 MG tablet Take 1 tablet (80 mg total) by mouth daily. 07/05/18 10/03/18  Larey Dresser, MD  calcium acetate (PHOSLO) 667 MG capsule Take 667 mg by mouth 3 (three) times daily with meals.  07/09/17   [provider]  citalopram (CELEXA) 20 MG tablet Take 20 mg by mouth daily. 02/25/17   [provider]  clopidogrel (PLAVIX) 75 MG tablet Take 1 tablet (75 mg total) by mouth daily. 04/04/18   Almyra Deforest, PA  ELIQUIS 5 MG TABS tablet TAKE 1 TABLET BY MOUTH TWICE DAILY 05/03/18   Almyra Deforest, PA  ezetimibe (ZETIA) 10 MG tablet TAKE 1 TABLET BY MOUTH ONCE DAILY 02/08/18   Larey Dresser, MD  gabapentin (NEURONTIN) 100 MG capsule Take 100 mg by mouth at bedtime.  02/11/17   [provider]  hydrALAZINE (APRESOLINE) 50 MG tablet TAKE 1 TABLET BY MOUTH THREE TIMES DAILY 08/16/18   Larey Dresser, MD  Insulin Human (INSULIN PUMP) SOLN Inject into the skin continuous. [place]    [provider]  isosorbide mononitrate (IMDUR) 60 MG 24 hr tablet TAKE 1 TABLET BY MOUTH ONCE DAILY 07/07/18   Larey Dresser, MD  losartan (COZAAR) 50 MG tablet Take 50 mg by mouth daily.    [provider]  metoprolol succinate (TOPROL-XL) 25 MG 24 hr tablet Take 1 tablet (25 mg total) by mouth at bedtime. 04/19/18   Shirley Friar, PA-C  pantoprazole (PROTONIX) 40 MG tablet Take 40 mg by mouth daily.  01/07/17   [provider]    Family History Family History  Problem Relation Age of Onset  . CAD Father   . Colon cancer Father   . Diabetes Brother     Social History Social History   Tobacco Use  . Smoking status: Former Smoker    Packs/day: 1.50    Years: 35.00    Pack years: 52.50    Types: Cigarettes    Last attempt to  quit: 2014    Years since quitting: 5.9  . Smokeless tobacco: Never Used  . Tobacco comment: "quit e-cigs ~ 2014"  Substance Use Topics  . Alcohol use: Never    Alcohol/week: 0.0 standard drinks    Frequency: Never  . Drug use: Never     Allergies   Patient has no known allergies.   Review of Systems Review of Systems  Respiratory: Negative for shortness of breath.   Cardiovascular: Negative for chest pain.  Gastrointestinal: Negative for abdominal pain, nausea and vomiting.  Musculoskeletal: Negative for arthralgias and myalgias.  Skin: Positive for wound.  Allergic/Immunologic: Positive for immunocompromised state (DM2).  Neurological: Negative for syncope, weakness, light-headedness and numbness.  Hematological: Bruises/bleeds easily.   All other systems reviewed and are negative for acute change except as noted in the HPI.    Physical Exam Updated Vital Signs BP (!) 171/73 (BP Location: Right Arm)   Pulse (!) 55   Temp 97.9 F (36.6 C) (Oral)   Resp 16   Ht 5\' 7"  (1.702 m)   Wt 63.5 kg   SpO2 99%   BMI 21.93 kg/m   Physical Exam Vitals signs and nursing note reviewed.  Constitutional:      General: He is not in acute distress.    Appearance: Normal appearance. He is well-developed. He is not toxic-appearing.     Comments: Afebrile, nontoxic, NAD  HENT:     Head: Normocephalic and atraumatic.  Eyes:     General:        Right eye: No discharge.        Left eye: No discharge.     Conjunctiva/sclera: Conjunctivae normal.  Neck:     Musculoskeletal: Normal range of motion and neck supple.  Cardiovascular:     Rate and Rhythm: Normal rate.  Pulmonary:     Effort: Pulmonary effort is normal. No respiratory distress.  Abdominal:     General: There is no distension.  Musculoskeletal: Normal range of motion.     Comments: LUE with fistula in place, palpable thrill noted. Bleeding subsided at dialysis access sites. Strength and sensation grossly intact, soft  compartments.   Skin:    General: Skin is warm and dry.     Findings: No rash.  Neurological:     Mental Status: He is alert and oriented to person, place, and time.     Sensory: No sensory deficit.      ED Treatments / Results  Labs (all labs ordered are listed, but only abnormal results are displayed) Labs Reviewed -  No data to display  EKG None  Radiology No results found.  Procedures Procedures (including critical care time)  Medications Ordered in ED Medications - No data to display   Initial Impression / Assessment and Plan / ED Course  I have reviewed the triage vital signs and the nursing notes.  Pertinent labs & imaging results that were available during my care of the patient were reviewed by me and considered in my medical decision making (see chart for details).     62 y.o. male here with bleeding fistula after dialysis.  Clamp was placed at the end, which was about 2 hours prior to arrival, and upon arrival the bleeding fistula has stopped bleeding.  Extremities neurovascularly intact, good thrill palpable in the fistula, extremity well perfused.  Advised applying pressure if bleeding occurs, follow-up with his PCP for ongoing management of his dialysis.  Doubt for need of further work-up today.  Discussed case with my attending Dr. Lacinda Axon who agrees with plan. I explained the diagnosis and have given explicit precautions to return to the ER including for any other new or worsening symptoms. The patient understands and accepts the medical plan as it's been dictated and I have answered their questions. Discharge instructions concerning home care and prescriptions have been given. The patient is STABLE and is discharged to home in good condition.    Final Clinical Impressions(s) / ED Diagnoses   Final diagnoses:  Complication of arteriovenous dialysis fistula, initial encounter  Complication of vascular access for dialysis, initial encounter    ED Discharge  Orders    [address] [place], [place] 08/26/18 [place], MD 08/29/18 1156

## 2018-08-26 NOTE — ED Triage Notes (Signed)
Pt here with left arm fistula bleeding uncontrolled after dialysis today. Center tried to stop bleeding for approx 1 hour, unsuccessfully. Has clamp on left arm, bleeding controlled at present. Denies pain. Alert, oriented x4.

## 2018-08-26 NOTE — Discharge Instructions (Signed)
Your fistula bled for longer than it should have but it appears to have stopped while in the ER. If the bleeding occurs again, apply pressure to the area. If it continues bleeding, come back to the ER. Follow up with your regular doctor in 2-3 days for recheck of symptoms. Return to the ER for emergent changes or worsening symptoms.

## 2018-08-26 NOTE — ED Notes (Signed)
Clamp removed, bleeding controlled. Bandage, clean, dry and intact. Pt requesting discharge. Dr. Lacinda Axon aware.

## 2018-09-02 ENCOUNTER — Inpatient Hospital Stay (HOSPITAL_COMMUNITY): Admission: RE | Admit: 2018-09-02 | Payer: Medicare Other | Source: Ambulatory Visit

## 2018-09-15 ENCOUNTER — Ambulatory Visit (HOSPITAL_COMMUNITY)
Admission: RE | Admit: 2018-09-15 | Discharge: 2018-09-15 | Disposition: A | Discharge status: Home / Self Care | Payer: Medicare Other | Source: Ambulatory Visit | Attending: Cardiology | Admitting: Cardiology

## 2018-09-15 DIAGNOSIS — I6523 Occlusion and stenosis of bilateral carotid arteries: Secondary | ICD-10-CM

## 2018-09-16 ENCOUNTER — Telehealth (HOSPITAL_COMMUNITY): Payer: Self-pay

## 2018-09-16 NOTE — Telephone Encounter (Signed)
[name] used to let pt know his study resultsand that a follow up study is recommended in 1 year. Intrepter relayed pt understood and had no further questions.

## 2018-11-01 ENCOUNTER — Other Ambulatory Visit (HOSPITAL_COMMUNITY): Payer: Self-pay | Admitting: Cardiology

## 2018-11-03 ENCOUNTER — Other Ambulatory Visit: Payer: Self-pay | Admitting: Physician Assistant

## 2018-11-03 NOTE — Telephone Encounter (Signed)
p is a 89 yof wt of 59kg creatinine of 4.82 (05/04/18). Pt is requesting 5mg  eliquis but clears only for 2.5 due to wt and cr

## 2018-11-04 NOTE — Telephone Encounter (Signed)
Weight on snapshot page is from 2017, for some reason most current weight does not populate.  Patient currently over 60 kg and by review spreadsheet has been for > 1 year

## 2018-11-10 ENCOUNTER — Ambulatory Visit (HOSPITAL_COMMUNITY)
Admission: RE | Admit: 2018-11-10 | Discharge: 2018-11-10 | Disposition: A | Discharge status: Home / Self Care | Payer: Medicare Other | Source: Ambulatory Visit | Attending: Cardiology | Admitting: Cardiology

## 2018-11-10 ENCOUNTER — Encounter (HOSPITAL_COMMUNITY): Payer: Self-pay | Admitting: Cardiology

## 2018-11-10 VITALS — BP 132/62 | HR 58 | Wt 145.0 lb

## 2018-11-10 DIAGNOSIS — Z7901 Long term (current) use of anticoagulants: Secondary | ICD-10-CM | POA: Diagnosis not present

## 2018-11-10 DIAGNOSIS — I252 Old myocardial infarction: Secondary | ICD-10-CM | POA: Insufficient documentation

## 2018-11-10 DIAGNOSIS — Z79899 Other long term (current) drug therapy: Secondary | ICD-10-CM | POA: Insufficient documentation

## 2018-11-10 DIAGNOSIS — I5032 Chronic diastolic (congestive) heart failure: Secondary | ICD-10-CM | POA: Diagnosis not present

## 2018-11-10 DIAGNOSIS — R079 Chest pain, unspecified: Secondary | ICD-10-CM | POA: Diagnosis not present

## 2018-11-10 DIAGNOSIS — E785 Hyperlipidemia, unspecified: Secondary | ICD-10-CM | POA: Insufficient documentation

## 2018-11-10 DIAGNOSIS — R0989 Other specified symptoms and signs involving the circulatory and respiratory systems: Secondary | ICD-10-CM | POA: Insufficient documentation

## 2018-11-10 DIAGNOSIS — N186 End stage renal disease: Secondary | ICD-10-CM | POA: Diagnosis not present

## 2018-11-10 DIAGNOSIS — I2510 Atherosclerotic heart disease of native coronary artery without angina pectoris: Secondary | ICD-10-CM | POA: Diagnosis not present

## 2018-11-10 DIAGNOSIS — I5042 Chronic combined systolic (congestive) and diastolic (congestive) heart failure: Secondary | ICD-10-CM | POA: Insufficient documentation

## 2018-11-10 DIAGNOSIS — E1122 Type 2 diabetes mellitus with diabetic chronic kidney disease: Secondary | ICD-10-CM | POA: Diagnosis not present

## 2018-11-10 DIAGNOSIS — I480 Paroxysmal atrial fibrillation: Secondary | ICD-10-CM | POA: Diagnosis not present

## 2018-11-10 DIAGNOSIS — I255 Ischemic cardiomyopathy: Secondary | ICD-10-CM | POA: Diagnosis not present

## 2018-11-10 DIAGNOSIS — Z955 Presence of coronary angioplasty implant and graft: Secondary | ICD-10-CM | POA: Diagnosis not present

## 2018-11-10 DIAGNOSIS — G8929 Other chronic pain: Secondary | ICD-10-CM | POA: Diagnosis not present

## 2018-11-10 DIAGNOSIS — Z794 Long term (current) use of insulin: Secondary | ICD-10-CM | POA: Insufficient documentation

## 2018-11-10 DIAGNOSIS — I132 Hypertensive heart and chronic kidney disease with heart failure and with stage 5 chronic kidney disease, or end stage renal disease: Secondary | ICD-10-CM | POA: Diagnosis not present

## 2018-11-10 DIAGNOSIS — Z992 Dependence on renal dialysis: Secondary | ICD-10-CM | POA: Insufficient documentation

## 2018-11-10 DIAGNOSIS — Z7902 Long term (current) use of antithrombotics/antiplatelets: Secondary | ICD-10-CM | POA: Insufficient documentation

## 2018-11-10 DIAGNOSIS — Z951 Presence of aortocoronary bypass graft: Secondary | ICD-10-CM | POA: Insufficient documentation

## 2018-11-10 DIAGNOSIS — E1151 Type 2 diabetes mellitus with diabetic peripheral angiopathy without gangrene: Secondary | ICD-10-CM | POA: Diagnosis not present

## 2018-11-10 LAB — LIPID PANEL
CHOL/HDL RATIO: 3.7 ratio
Cholesterol: 103 mg/dL (ref 0–200)
HDL: 28 mg/dL — ABNORMAL LOW (ref >40)
LDL Cholesterol: 59 mg/dL (ref 0–99)
Triglycerides: 81 mg/dL (ref <150)
VLDL: 16 mg/dL (ref 0–40)

## 2018-11-10 LAB — CBC
HCT: 36 % — ABNORMAL LOW (ref 39.0–52.0)
Hemoglobin: 11.3 g/dL — ABNORMAL LOW (ref 13.0–17.0)
MCH: 29.8 pg (ref 26.0–34.0)
MCHC: 31.4 g/dL (ref 30.0–36.0)
MCV: 95 fL (ref 80.0–100.0)
NRBC: 0 % (ref 0.0–0.2)
Platelets: 150 10*3/uL (ref 150–400)
RBC: 3.79 MIL/uL — ABNORMAL LOW (ref 4.22–5.81)
RDW: 14.4 % (ref 11.5–15.5)
WBC: 9.2 10*3/uL (ref 4.0–10.5)

## 2018-11-10 LAB — HEPATIC FUNCTION PANEL
ALT: 25 U/L (ref 0–44)
AST: 21 U/L (ref 15–41)
Albumin: 3.7 g/dL (ref 3.5–5.0)
Alkaline Phosphatase: 87 U/L (ref 38–126)
Bilirubin, Direct: <0.1 mg/dL (ref 0.0–0.2)
Total Bilirubin: 0.6 mg/dL (ref 0.3–1.2)
Total Protein: 7.4 g/dL (ref 6.5–8.1)

## 2018-11-10 NOTE — Progress Notes (Signed)
PCP: Dr. Tasia Catchings [facility]) Cardiology: Dr. Aundra Dubin  63 y.o. 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  He was admitted in 9/17 with NSTEMI.  LHC showed 3VD and he had CABG x 3.  He had CKD stage IV when he was admitted and developed ESRD requiring HD while in the hospital.  He had transient atrial fibrillation post-CABG.  He developed cardiogenic shock requiring milrinone but was able to taper off.  Echo in 9/17 showed EF 35-40%.    Admitted 8/1 through 04/16/2017 with malignant hypertension due to medication noncompliance. Continues on HD.  HF M-W-F. Discharge weight 140 pounds.   Admitted 10/18 with chest pain and pulmonary edema. NSTEMI noted on admit. Echo showed preserved EF but + WMAs. Cath with 95% ostial SVG-OM1 stenosis with TIMI 2 flow in graft.  Patient had DES to SVG-OM1.   Admitted in 12/18 with atypical chest pain, suspect not cardiac.   NSTEMI in 7/19 with severe in-stent restenosis in SVG-OM1, had DES x 2 to native OM1.  Echo showed EF 50%.  Atrial fibrillation was noted this admission.   He presents today for followup of CHF and CAD.  Interpreter is present.  No significant exertional dyspnea.  He does have occasional left-sided chest pain, says it is a tightness and has been having episodes for months.  Seems like it may be exertional but history is difficult even using the interpreter.  His right calf also feels "stiff" when he walks.   Labs (12/18): LDL 64, HDL 31 Labs (7/19): LDL 36  ECG: NSR, LVH with repolarization abnormality and inferior Qs (similar to prior).   PMH: 1. Type II diabetes 2. HTN 3. ESRD 4. CAD: NSTEMI 9/17 with CABG x 3 (LIMA-LAD, SVG-OM, SVG-RCA).   - NSTEMI 10/18 s/p DES to ostial SVG-OM - NSTEMI 7/19: severe in-stent restenosis in SVG-OM, chronic total occlusion of SVG-RCA.  DES x 2 to native OM1.  5. Chronic systolic CHF: Ischemic cardiomyopathy.    - Echo (9/17) with EF 35-40%, grade II diastolic dysfunction.  - Echo (1/18) with EF 60-65%, grade II diastolic dysfunction. - Echo (10/18) with EF 55%, Akinesis of the basal-midinferolateral, inferior, and inferoseptal myocardiam. Akinesis of the basal anteroseptal myocardium.  - Echo (7/19): EF 50%, moderate LVH, mildly decreased RV systolic function.  6. Atrial fibrillation: Paroxysmal.  Only noted post-op CABG.  7. ABIs normal in 12/17. 5/18 ABIs normal.  1/19 ABIs normal.  8. Carotid stenosis: Carotid dopplers (12/18) with < 50% right common carotid stenosis, 1-51% LICA stenosis.  - Carotid dopplers (1/20): < 50% BICA stenosis.  9. Atrial fibrillation: Paroxysmal.  10. 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   FH: No premature CAD.  Review of systems complete and found to be negative unless listed in HPI.    Current Outpatient Medications  Medication Sig Dispense Refill  . acetaminophen (TYLENOL) 500 MG tablet Take 500 mg by mouth every 6 (six) hours as needed for mild pain.    Marland Kitchen amLODipine (NORVASC) 10 MG tablet Take 10 mg by mouth daily.    Marland Kitchen atorvastatin (LIPITOR) 80 MG tablet Take 1 tablet (80 mg total) by mouth daily. 90 tablet 3  . calcium acetate (PHOSLO) 667 MG capsule Take 667 mg by mouth 3 (three) times daily with meals.     Marland Kitchen  citalopram (CELEXA) 20 MG tablet Take 20 mg by mouth daily.  1  . clopidogrel (PLAVIX) 75 MG tablet Take 1 tablet (75 mg total) by mouth daily. 90 tablet 3  . ELIQUIS 5 MG TABS tablet TAKE 1 TABLET BY MOUTH TWICE DAILY 180 tablet 1  . ezetimibe (ZETIA) 10 MG tablet TAKE 1 TABLET BY MOUTH ONCE DAILY 90 tablet 3  . gabapentin (NEURONTIN) 100 MG capsule Take 100 mg by mouth at bedtime.   0  . Insulin Human (INSULIN PUMP) SOLN Inject into the skin continuous. [place]    . isosorbide mononitrate (IMDUR) 60 MG 24 hr tablet TAKE 1 TABLET BY MOUTH  ONCE DAILY 30 tablet 0  . losartan (COZAAR) 50 MG tablet Take 100 mg by mouth daily.     . metoprolol succinate (TOPROL-XL) 25 MG 24 hr tablet Take 1 tablet (25 mg total) by mouth at bedtime. 90 tablet 3  . pantoprazole (PROTONIX) 40 MG tablet Take 40 mg by mouth daily.   0   No current facility-administered medications for this encounter.    Vitals:   11/10/18 0920  BP: 132/62  Pulse: (!) 58  SpO2: 95%  Weight: 65.8 kg (145 lb)   Wt Readings from Last 3 Encounters:  11/10/18 65.8 kg (145 lb)  08/26/18 63.5 kg (140 lb)  07/05/18 66 kg (145 lb 9.6 oz)   General: NAD Neck: No JVD, no thyromegaly or thyroid nodule.  Lungs: Clear to auscultation bilaterally with normal respiratory effort. CV: Nondisplaced PMI.  Heart regular S1/S2, no S3/S4, no murmur.  No peripheral edema.  No carotid bruit.  Normal pedal pulses.  Abdomen: Soft, nontender, no hepatosplenomegaly, no distention.  Skin: Intact without lesions or rashes.  Neurologic: Alert and oriented x 3.  Psych: Normal affect. Extremities: No clubbing or cyanosis.  HEENT: Normal.   Assessment/Plan: 1. CAD: s/p CABG.  NSTEMI 10/18, cath with 95% ostial SVG-OM1 stenosis with TIMI 2 flow in graft, s/p DES to SVG-OM1 07/07/17. NSTEMI 7/19 with in-stent restenosis in the SVG-OM1, had DES x 2 to native OM1.   He has been having chronic chest pain episodes, possibly exertional.  No change to pattern over the last few months.  - I will arrange for Lexiscan Cardiolite to assess for ischemia.  - Off ASA given Eliquis use.   - Continue Plavix minimum of 6 months, ideally 1 year (to 7/20).     - Continue Imdur.  - Continue atorvastatin 80 mg daily.  2. Hyperlipidemia: Continue atorvastatin and Zetia.  Check lipids today.      3. Chronic diastolic CHF:  Ischemic cardiomyopathy.  EF low in past but improved after CABG. Most recent echo 7/19 with EF 50%.    - On dialysis for volume control.  - Continue Toprol XL 25 mg at bed time.   4. Atrial  fibrillation: Paroxysmal, NSR today.  He will remain on Eliquis.  CBC today.   5. HTN: BP controlled.  6. ESRD.  7. Carotid bruit: Mild carotid disease on dopplers in 1/20. 8. PAD: Symptoms concerning for right calf claudication.  I will arrange for peripheral arterial dopplers.   If no ischemia on Cardiolite, followup in 3 months.   Loralie Champagne 11/10/2018

## 2018-11-10 NOTE — Patient Instructions (Signed)
Lab work done today. We will notify you of any abnormal lab work.  EKG done today  Your physician has requested that you have a lexiscan myoview. For further information please visit [email or web address]. Please follow instruction sheet, as given.  Your physician recommends that you have a dopplar ultrasound done for your lower legs. You will be called in order to set up an appointment.  Follow up with Dr. Aundra Dubin in 3 months

## 2018-11-10 NOTE — Progress Notes (Signed)
..  EKG CRITICAL VALUE     12 lead EKG performed.  Critical value noted.Lindley Magnus, RN notified.   Ihor Gully, CCT 11/10/2018 11:33 AM

## 2018-11-11 ENCOUNTER — Other Ambulatory Visit (HOSPITAL_COMMUNITY): Payer: Self-pay

## 2018-11-11 DIAGNOSIS — I739 Peripheral vascular disease, unspecified: Secondary | ICD-10-CM

## 2018-11-11 DIAGNOSIS — I5032 Chronic diastolic (congestive) heart failure: Secondary | ICD-10-CM

## 2018-11-11 NOTE — Progress Notes (Signed)
Re entered previous incorrect order for peripheral arterial dopplars

## 2018-11-14 ENCOUNTER — Telehealth (HOSPITAL_COMMUNITY): Payer: Self-pay | Admitting: *Deleted

## 2018-11-14 ENCOUNTER — Encounter (HOSPITAL_COMMUNITY): Payer: Self-pay | Admitting: *Deleted

## 2018-11-14 NOTE — Telephone Encounter (Signed)
Patient's wife, per dpr, using the language line (id [phone]) given detailed instructions per Myocardial Perfusion Study Information Sheet for the test on 11/15/18. Patient notified to arrive 15 minutes early and that it is imperative to arrive on time for appointment to keep from having the test rescheduled.  If you need to cancel or reschedule your appointment, please call the office within 24 hours of your appointment. . Patient verbalized understanding. Kirstie Peri

## 2018-11-15 ENCOUNTER — Ambulatory Visit (HOSPITAL_COMMUNITY): Payer: Medicare Other | Attending: Cardiology

## 2018-11-15 DIAGNOSIS — R079 Chest pain, unspecified: Secondary | ICD-10-CM | POA: Insufficient documentation

## 2018-11-15 LAB — MYOCARDIAL PERFUSION IMAGING
CHL CUP RESTING HR STRESS: 50 {beats}/min
LV dias vol: 123 mL (ref 62–150)
LV sys vol: 57 mL
Peak HR: 60 {beats}/min
SDS: 5
SRS: 1
SSS: 6
TID: 1.08

## 2018-11-15 MED ORDER — REGADENOSON 0.4 MG/5ML IV SOLN
0.4000 mg | Freq: Once | INTRAVENOUS | Status: AC
  Administered 2018-11-15: 0.4 mg via INTRAVENOUS

## 2018-11-15 MED ORDER — TECHNETIUM TC 99M TETROFOSMIN IV KIT
10.2000 | PACK | Freq: Once | INTRAVENOUS | Status: AC | PRN
  Administered 2018-11-15: 10.2 via INTRAVENOUS
  Filled 2018-11-15: qty 11

## 2018-11-15 MED ORDER — TECHNETIUM TC 99M TETROFOSMIN IV KIT
33.0000 | PACK | Freq: Once | INTRAVENOUS | Status: AC | PRN
  Administered 2018-11-15: 33 via INTRAVENOUS
  Filled 2018-11-15: qty 33

## 2018-11-18 ENCOUNTER — Other Ambulatory Visit (HOSPITAL_COMMUNITY): Payer: Self-pay | Admitting: Cardiology

## 2018-11-18 DIAGNOSIS — I739 Peripheral vascular disease, unspecified: Secondary | ICD-10-CM

## 2018-12-01 ENCOUNTER — Other Ambulatory Visit (HOSPITAL_COMMUNITY): Payer: Self-pay | Admitting: Cardiology

## 2018-12-15 ENCOUNTER — Inpatient Hospital Stay (HOSPITAL_COMMUNITY): Admission: RE | Admit: 2018-12-15 | Payer: Medicare Other | Source: Ambulatory Visit

## 2018-12-15 ENCOUNTER — Ambulatory Visit (HOSPITAL_COMMUNITY)
Admission: RE | Admit: 2018-12-15 | Discharge: 2018-12-15 | Disposition: A | Discharge status: Home / Self Care | Payer: Medicare Other | Source: Ambulatory Visit | Attending: Cardiology | Admitting: Cardiology

## 2018-12-15 ENCOUNTER — Other Ambulatory Visit: Payer: Self-pay

## 2018-12-15 DIAGNOSIS — I739 Peripheral vascular disease, unspecified: Secondary | ICD-10-CM | POA: Insufficient documentation

## 2019-01-30 ENCOUNTER — Encounter (HOSPITAL_COMMUNITY): Payer: Medicare Other

## 2019-02-01 ENCOUNTER — Encounter (HOSPITAL_COMMUNITY): Payer: Medicare Other

## 2019-02-07 ENCOUNTER — Encounter (HOSPITAL_COMMUNITY): Payer: Medicare Other | Admitting: Cardiology

## 2019-02-13 ENCOUNTER — Other Ambulatory Visit (HOSPITAL_COMMUNITY): Payer: Self-pay | Admitting: Cardiology

## 2019-03-03 ENCOUNTER — Other Ambulatory Visit (HOSPITAL_COMMUNITY): Payer: Self-pay | Admitting: Cardiology

## 2019-04-11 ENCOUNTER — Encounter (HOSPITAL_COMMUNITY): Payer: Medicare Other | Admitting: Cardiology

## 2019-04-13 ENCOUNTER — Inpatient Hospital Stay (HOSPITAL_COMMUNITY)
Admission: AD | Admit: 2019-04-13 | Discharge: 2019-04-17 | DRG: 286 | Disposition: A | Discharge status: Home / Self Care | Payer: Medicare Other | Source: Ambulatory Visit | Attending: Cardiology | Admitting: Cardiology

## 2019-04-13 ENCOUNTER — Encounter (HOSPITAL_COMMUNITY): Payer: Self-pay | Admitting: Cardiology

## 2019-04-13 ENCOUNTER — Other Ambulatory Visit: Payer: Self-pay

## 2019-04-13 ENCOUNTER — Ambulatory Visit (HOSPITAL_COMMUNITY)
Admission: RE | Admit: 2019-04-13 | Discharge: 2019-04-13 | Disposition: A | Discharge status: Home / Self Care | Payer: Medicare Other | Source: Ambulatory Visit | Attending: Cardiology | Admitting: Cardiology

## 2019-04-13 VITALS — BP 154/80 | HR 52 | Wt 148.8 lb

## 2019-04-13 DIAGNOSIS — I5032 Chronic diastolic (congestive) heart failure: Secondary | ICD-10-CM

## 2019-04-13 DIAGNOSIS — I2582 Chronic total occlusion of coronary artery: Secondary | ICD-10-CM | POA: Diagnosis present

## 2019-04-13 DIAGNOSIS — I679 Cerebrovascular disease, unspecified: Secondary | ICD-10-CM | POA: Diagnosis present

## 2019-04-13 DIAGNOSIS — Z79899 Other long term (current) drug therapy: Secondary | ICD-10-CM | POA: Insufficient documentation

## 2019-04-13 DIAGNOSIS — N186 End stage renal disease: Secondary | ICD-10-CM | POA: Insufficient documentation

## 2019-04-13 DIAGNOSIS — Z87891 Personal history of nicotine dependence: Secondary | ICD-10-CM

## 2019-04-13 DIAGNOSIS — I200 Unstable angina: Secondary | ICD-10-CM

## 2019-04-13 DIAGNOSIS — I25719 Atherosclerosis of autologous vein coronary artery bypass graft(s) with unspecified angina pectoris: Secondary | ICD-10-CM | POA: Diagnosis present

## 2019-04-13 DIAGNOSIS — T82855A Stenosis of coronary artery stent, initial encounter: Secondary | ICD-10-CM | POA: Diagnosis present

## 2019-04-13 DIAGNOSIS — I132 Hypertensive heart and chronic kidney disease with heart failure and with stage 5 chronic kidney disease, or end stage renal disease: Secondary | ICD-10-CM | POA: Insufficient documentation

## 2019-04-13 DIAGNOSIS — E1122 Type 2 diabetes mellitus with diabetic chronic kidney disease: Secondary | ICD-10-CM | POA: Diagnosis present

## 2019-04-13 DIAGNOSIS — N2581 Secondary hyperparathyroidism of renal origin: Secondary | ICD-10-CM | POA: Diagnosis present

## 2019-04-13 DIAGNOSIS — Z833 Family history of diabetes mellitus: Secondary | ICD-10-CM

## 2019-04-13 DIAGNOSIS — Z951 Presence of aortocoronary bypass graft: Secondary | ICD-10-CM | POA: Insufficient documentation

## 2019-04-13 DIAGNOSIS — Z7902 Long term (current) use of antithrombotics/antiplatelets: Secondary | ICD-10-CM | POA: Insufficient documentation

## 2019-04-13 DIAGNOSIS — Z8249 Family history of ischemic heart disease and other diseases of the circulatory system: Secondary | ICD-10-CM

## 2019-04-13 DIAGNOSIS — I480 Paroxysmal atrial fibrillation: Secondary | ICD-10-CM | POA: Diagnosis present

## 2019-04-13 DIAGNOSIS — I214 Non-ST elevation (NSTEMI) myocardial infarction: Secondary | ICD-10-CM | POA: Insufficient documentation

## 2019-04-13 DIAGNOSIS — I252 Old myocardial infarction: Secondary | ICD-10-CM

## 2019-04-13 DIAGNOSIS — Z992 Dependence on renal dialysis: Secondary | ICD-10-CM | POA: Diagnosis not present

## 2019-04-13 DIAGNOSIS — Y831 Surgical operation with implant of artificial internal device as the cause of abnormal reaction of the patient, or of later complication, without mention of misadventure at the time of the procedure: Secondary | ICD-10-CM | POA: Diagnosis present

## 2019-04-13 DIAGNOSIS — I5042 Chronic combined systolic (congestive) and diastolic (congestive) heart failure: Secondary | ICD-10-CM | POA: Insufficient documentation

## 2019-04-13 DIAGNOSIS — E1151 Type 2 diabetes mellitus with diabetic peripheral angiopathy without gangrene: Secondary | ICD-10-CM | POA: Diagnosis present

## 2019-04-13 DIAGNOSIS — R0989 Other specified symptoms and signs involving the circulatory and respiratory systems: Secondary | ICD-10-CM | POA: Insufficient documentation

## 2019-04-13 DIAGNOSIS — E11649 Type 2 diabetes mellitus with hypoglycemia without coma: Secondary | ICD-10-CM | POA: Insufficient documentation

## 2019-04-13 DIAGNOSIS — I25119 Atherosclerotic heart disease of native coronary artery with unspecified angina pectoris: Secondary | ICD-10-CM | POA: Diagnosis present

## 2019-04-13 DIAGNOSIS — Z955 Presence of coronary angioplasty implant and graft: Secondary | ICD-10-CM | POA: Insufficient documentation

## 2019-04-13 DIAGNOSIS — Z20828 Contact with and (suspected) exposure to other viral communicable diseases: Secondary | ICD-10-CM | POA: Diagnosis present

## 2019-04-13 DIAGNOSIS — I6523 Occlusion and stenosis of bilateral carotid arteries: Secondary | ICD-10-CM | POA: Diagnosis present

## 2019-04-13 DIAGNOSIS — Z7901 Long term (current) use of anticoagulants: Secondary | ICD-10-CM

## 2019-04-13 DIAGNOSIS — D631 Anemia in chronic kidney disease: Secondary | ICD-10-CM | POA: Diagnosis present

## 2019-04-13 DIAGNOSIS — E875 Hyperkalemia: Secondary | ICD-10-CM | POA: Diagnosis present

## 2019-04-13 DIAGNOSIS — R079 Chest pain, unspecified: Secondary | ICD-10-CM | POA: Diagnosis present

## 2019-04-13 DIAGNOSIS — I255 Ischemic cardiomyopathy: Secondary | ICD-10-CM | POA: Diagnosis present

## 2019-04-13 DIAGNOSIS — Z794 Long term (current) use of insulin: Secondary | ICD-10-CM | POA: Insufficient documentation

## 2019-04-13 DIAGNOSIS — E785 Hyperlipidemia, unspecified: Secondary | ICD-10-CM | POA: Diagnosis present

## 2019-04-13 DIAGNOSIS — I10 Essential (primary) hypertension: Secondary | ICD-10-CM | POA: Diagnosis not present

## 2019-04-13 DIAGNOSIS — M898X9 Other specified disorders of bone, unspecified site: Secondary | ICD-10-CM | POA: Diagnosis present

## 2019-04-13 DIAGNOSIS — I739 Peripheral vascular disease, unspecified: Secondary | ICD-10-CM

## 2019-04-13 DIAGNOSIS — Z800 Family history of malignant neoplasm of digestive organs: Secondary | ICD-10-CM

## 2019-04-13 DIAGNOSIS — I2510 Atherosclerotic heart disease of native coronary artery without angina pectoris: Secondary | ICD-10-CM | POA: Diagnosis not present

## 2019-04-13 LAB — TROPONIN I (HIGH SENSITIVITY)
Troponin I (High Sensitivity): 166 ng/L (ref <18)
Troponin I (High Sensitivity): 166 ng/L (ref <18)
Troponin I (High Sensitivity): 153 ng/L (ref <18)
Troponin I (High Sensitivity): 139 ng/L (ref <18)

## 2019-04-13 LAB — SARS CORONAVIRUS 2 BY RT PCR (HOSPITAL ORDER, PERFORMED IN ~~LOC~~ HOSPITAL LAB): SARS Coronavirus 2: NEGATIVE

## 2019-04-13 LAB — HEPARIN LEVEL (UNFRACTIONATED): Heparin Unfractionated: >2.2 IU/mL — ABNORMAL HIGH (ref 0.30–0.70)

## 2019-04-13 LAB — APTT: aPTT: 41 seconds — ABNORMAL HIGH (ref 24–36)

## 2019-04-13 MED ORDER — CINACALCET HCL 30 MG PO TABS
30.0000 mg | ORAL_TABLET | ORAL | Status: DC
  Administered 2019-04-14 – 2019-04-17 (×2): 30 mg via ORAL
  Filled 2019-04-13 (×2): qty 1

## 2019-04-13 MED ORDER — PANTOPRAZOLE SODIUM 40 MG PO TBEC
40.0000 mg | DELAYED_RELEASE_TABLET | Freq: Every day | ORAL | Status: DC
  Administered 2019-04-14 – 2019-04-17 (×4): 40 mg via ORAL
  Filled 2019-04-13 (×4): qty 1

## 2019-04-13 MED ORDER — ACETAMINOPHEN 325 MG PO TABS: 650.0000 mg | ORAL_TABLET | ORAL | Status: DC | PRN

## 2019-04-13 MED ORDER — ONDANSETRON HCL 4 MG/2ML IJ SOLN: 4.0000 mg | Freq: Four times a day (QID) | INTRAMUSCULAR | Status: DC | PRN

## 2019-04-13 MED ORDER — CALCIUM ACETATE (PHOS BINDER) 667 MG PO CAPS
667.0000 mg | ORAL_CAPSULE | Freq: Three times a day (TID) | ORAL | Status: DC
  Administered 2019-04-14 (×2): 667 mg via ORAL
  Filled 2019-04-13 (×5): qty 1

## 2019-04-13 MED ORDER — OFLOXACIN 0.3 % OP SOLN: 1.0000 [drp] | OPHTHALMIC | Status: DC

## 2019-04-13 MED ORDER — ATORVASTATIN CALCIUM 80 MG PO TABS
80.0000 mg | ORAL_TABLET | Freq: Every day | ORAL | Status: DC
  Administered 2019-04-14 – 2019-04-17 (×4): 80 mg via ORAL
  Filled 2019-04-13 (×4): qty 1

## 2019-04-13 MED ORDER — DOXERCALCIFEROL 4 MCG/2ML IV SOLN
2.0000 ug | INTRAVENOUS | Status: DC
  Administered 2019-04-14 – 2019-04-17 (×2): 2 ug via INTRAVENOUS
  Filled 2019-04-13 (×2): qty 2

## 2019-04-13 MED ORDER — METOPROLOL SUCCINATE ER 25 MG PO TB24
12.5000 mg | ORAL_TABLET | Freq: Every day | ORAL | Status: DC
  Administered 2019-04-14 – 2019-04-16 (×3): 12.5 mg via ORAL
  Filled 2019-04-13 (×4): qty 1

## 2019-04-13 MED ORDER — LOSARTAN POTASSIUM 50 MG PO TABS
100.0000 mg | ORAL_TABLET | Freq: Every day | ORAL | Status: DC
  Administered 2019-04-14 – 2019-04-17 (×4): 100 mg via ORAL
  Filled 2019-04-13 (×4): qty 2

## 2019-04-13 MED ORDER — ISOSORBIDE MONONITRATE ER 60 MG PO TB24: 90.0000 mg | ORAL_TABLET | Freq: Every day | ORAL | 3 refills | Status: DC

## 2019-04-13 MED ORDER — ACETAMINOPHEN 500 MG PO TABS: 500.0000 mg | ORAL_TABLET | Freq: Four times a day (QID) | ORAL | Status: DC | PRN

## 2019-04-13 MED ORDER — ISOSORBIDE MONONITRATE ER 60 MG PO TB24
90.0000 mg | ORAL_TABLET | Freq: Every day | ORAL | Status: DC
  Administered 2019-04-14 – 2019-04-17 (×4): 90 mg via ORAL
  Filled 2019-04-13 (×4): qty 1

## 2019-04-13 MED ORDER — CALCIUM ACETATE (PHOS BINDER) 667 MG PO CAPS
667.0000 mg | ORAL_CAPSULE | Freq: Three times a day (TID) | ORAL | Status: DC
  Administered 2019-04-14 – 2019-04-17 (×8): 667 mg via ORAL
  Filled 2019-04-13 (×8): qty 1

## 2019-04-13 MED ORDER — SODIUM CHLORIDE 0.9 % IV SOLN: 250.0000 mL | INTRAVENOUS | Status: DC | PRN

## 2019-04-13 MED ORDER — CITALOPRAM HYDROBROMIDE 20 MG PO TABS
20.0000 mg | ORAL_TABLET | Freq: Every day | ORAL | Status: DC
  Administered 2019-04-14 – 2019-04-17 (×4): 20 mg via ORAL
  Filled 2019-04-13 (×4): qty 1

## 2019-04-13 MED ORDER — CHLORHEXIDINE GLUCONATE CLOTH 2 % EX PADS
6.0000 | MEDICATED_PAD | Freq: Every day | CUTANEOUS | Status: DC
  Administered 2019-04-14 – 2019-04-15 (×2): 6 via TOPICAL

## 2019-04-13 MED ORDER — EZETIMIBE 10 MG PO TABS
10.0000 mg | ORAL_TABLET | Freq: Every day | ORAL | Status: DC
  Administered 2019-04-14 – 2019-04-17 (×4): 10 mg via ORAL
  Filled 2019-04-13 (×4): qty 1

## 2019-04-13 MED ORDER — CLOPIDOGREL BISULFATE 75 MG PO TABS
75.0000 mg | ORAL_TABLET | Freq: Every day | ORAL | Status: DC
  Administered 2019-04-14 – 2019-04-16 (×3): 75 mg via ORAL
  Filled 2019-04-13 (×3): qty 1

## 2019-04-13 MED ORDER — AMLODIPINE BESYLATE 10 MG PO TABS
10.0000 mg | ORAL_TABLET | Freq: Every day | ORAL | Status: DC
  Administered 2019-04-14 – 2019-04-17 (×4): 10 mg via ORAL
  Filled 2019-04-13 (×4): qty 1

## 2019-04-13 MED ORDER — HEPARIN (PORCINE) 25000 UT/250ML-% IV SOLN
950.0000 [IU]/h | INTRAVENOUS | Status: DC
  Administered 2019-04-14 – 2019-04-16 (×3): 950 [IU]/h via INTRAVENOUS
  Filled 2019-04-13 (×3): qty 250

## 2019-04-13 NOTE — H&P (Signed)
ADVANCNED HEART FAILURE H&P  This note reflects work completed today.  PCP: Dr. Tasia Catchings [facility]) Cardiology: Dr. Aundra Dubin  63 y.o. 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  He was admitted in 9/17 with NSTEMI.  LHC showed 3VD and he had CABG x 3.  He had CKD stage IV when he was admitted and developed ESRD requiring HD while in the hospital.  He had transient atrial fibrillation post-CABG.  He developed cardiogenic shock requiring milrinone but was able to taper off.  Echo in 9/17 showed EF 35-40%.    Admitted 8/1 through 04/16/2017 with malignant hypertension due to medication noncompliance. Continues on HD.  HF M-W-F. Discharge weight 140 pounds.   Admitted 10/18 with chest pain and pulmonary edema. NSTEMI noted on admit. Echo showed preserved EF but + WMAs. Cath with 95% ostial SVG-OM1 stenosis with TIMI 2 flow in graft. Patient had DES to SVG-OM1.   Admitted in 12/18 with atypical chest pain, suspect not cardiac.   NSTEMI in 7/19 with severe in-stent restenosis in SVG-OM1, had DES x 2 to native OM1.  Echo showed EF 50%.  Atrial fibrillation was noted this admission.   He has had a chronic pattern of atypical chest pain.  Cardiolite in 3/20 showed EF 54%, small fixed inferior defect with no ischemia.   He presents today for followup of CHF and CAD.  We had to use a phone interpreter.  He reports an episode of severe left-sided chest pain last Saturday in the setting of hypoglycemia (blood glucose in 40s), resolved when he raised his blood sugar. However, since that time, he has had on and off chest pain episodes, described as pressure in the center of his chest.  Pain is mild to moderate when it occurs.  No trigger, and it will eventually resolve on its own.  He continues on dialysis MWF.    ECG (personally reviewed): NSR, lateral TWIs (not significantly changed from prior).   Labs  (12/18): LDL 64, HDL 31 Labs (7/19): LDL 36  Labs (2/20): hgb 11.3, LDL 59, HDL 28  ECG: NSR, LVH with repolarization abnormality and inferior Qs (similar to prior).   PMH: 1. Type II diabetes 2. HTN 3. ESRD 4. CAD: NSTEMI 9/17 with CABG x 3 (LIMA-LAD, SVG-OM, SVG-RCA).   - NSTEMI 10/18 s/p DES to ostial SVG-OM - NSTEMI 7/19: severe in-stent restenosis in SVG-OM, chronic total occlusion of SVG-RCA.  DES x 2 to native OM1.  - Cardiolite (3/20): EF 54%, small fixed inferior defect with no ischemia.  5. Chronic systolic CHF: Ischemic cardiomyopathy.   - Echo (9/17) with EF 35-40%, grade II diastolic dysfunction.  - Echo (1/18) with EF 60-65%, grade II diastolic dysfunction. - Echo (10/18) with EF 55%, Akinesis of the basal-midinferolateral, inferior, and inferoseptal myocardiam. Akinesis of the basal anteroseptal myocardium.  - Echo (7/19): EF 50%, moderate LVH, mildly decreased RV systolic function.  6. Atrial fibrillation: Paroxysmal.  Only noted post-op CABG.  7. ABIs normal in 12/17. 5/18 ABIs normal.  1/19 ABIs normal. 4/20 ABIs normal.  8. Carotid stenosis: Carotid dopplers (12/18) with < 50% right common carotid stenosis, 1-88% LICA stenosis.  - Carotid dopplers (1/20): < 50% BICA stenosis.  9. Atrial fibrillation: Paroxysmal.  10. Cerebrovascular disease: Occluded right PICA and vertebral on 7/19 CTA   SH: Married, lives in [place], 1 son, nonsmoker, no ETOH, speaks only [place].   FH:  No premature CAD.  Review of systems complete and found to be negative unless listed in HPI.          Current Outpatient Medications  Medication Sig Dispense Refill  . acetaminophen (TYLENOL) 500 MG tablet Take 500 mg by mouth every 6 (six) hours as needed for mild pain.    Marland Kitchen amLODipine (NORVASC) 10 MG tablet Take 10 mg by mouth daily.    Marland Kitchen atorvastatin (LIPITOR) 80 MG tablet Take 1 tablet (80 mg total) by mouth daily. 90 tablet 3  . calcium acetate (PHOSLO) 667 MG capsule Take 667 mg by  mouth 3 (three) times daily with meals.     . citalopram (CELEXA) 20 MG tablet Take 20 mg by mouth daily.  1  . clopidogrel (PLAVIX) 75 MG tablet Take 1 tablet (75 mg total) by mouth daily. 90 tablet 3  . ELIQUIS 5 MG TABS tablet TAKE 1 TABLET BY MOUTH TWICE DAILY 180 tablet 1  . ezetimibe (ZETIA) 10 MG tablet Take 1 tablet by mouth once daily 90 tablet 0  . gabapentin (NEURONTIN) 100 MG capsule Take 100 mg by mouth at bedtime.   0  . Insulin Human (INSULIN PUMP) SOLN Inject into the skin continuous. NOVOLOG    . isosorbide mononitrate (IMDUR) 60 MG 24 hr tablet Take 1.5 tablets (90 mg total) by mouth daily. 135 tablet 3  . losartan (COZAAR) 100 MG tablet Take 100 mg by mouth daily.     . metoprolol succinate (TOPROL-XL) 25 MG 24 hr tablet Take 1 tablet (25 mg total) by mouth at bedtime. (Patient taking differently: Take 12.5 mg by mouth at bedtime. ) 90 tablet 3  . NOVOLOG 100 UNIT/ML injection MEDTRONIC 630 G PUMP BASAL 12A 0.7 6A 0.85 10P 0.7 BOLUS 12A 12 11A 10. ISF 60. INJECT TOTAL OF 60 UNITS SUBCUTANEOUSLY DAILY    . ofloxacin (OCUFLOX) 0.3 % ophthalmic solution INSTILL 1 DROP INTO RIGHT EYE 4 TIMES DAILY FOR 5 DAYS FOLLOWING INJECTION    . pantoprazole (PROTONIX) 40 MG tablet Take 40 mg by mouth daily.   0   No current facility-administered medications for this encounter.       Vitals:   04/13/19 1218  BP: (!) 154/80  Pulse: (!) 52  SpO2: 98%  Weight: 67.5 kg (148 lb 12.8 oz)      Wt Readings from Last 3 Encounters:  04/13/19 67.5 kg (148 lb 12.8 oz)  11/15/18 65.8 kg (145 lb)  11/10/18 65.8 kg (145 lb)   General: NAD Neck: No JVD, no thyromegaly or thyroid nodule.  Lungs: Clear to auscultation bilaterally with normal respiratory effort. CV: Nondisplaced PMI.  Heart regular S1/S2, no S3/S4, no murmur.  No peripheral edema.  No carotid bruit.  Normal pedal pulses.  Abdomen: Soft, nontender, no hepatosplenomegaly, no distention.  Skin: Intact without lesions or  rashes.  Neurologic: Alert and oriented x 3.  Psych: Normal affect. Extremities: No clubbing or cyanosis.  HEENT: Normal.   Assessment/Plan: 1. CAD: s/p CABG.  NSTEMI 10/18, cath with 95% ostial SVG-OM1 stenosis with TIMI 2 flow in graft, s/p DES to SVG-OM1 07/07/17. NSTEMI 7/19 with in-stent restenosis in the SVG-OM1, had DES x 2 to native OM1.   He has had chronic atypical chest pain episodes, Cardiolite in 3/20 showed no ischemia.  He had an episode of severe CP associated with hypoglycemia about a week ago, and since then, he has had on and off episodes of more mild chest pain with no trigger.  History is difficult with language barrier (phone interpretor for [place] today). ECG is unchanged.   - I will keep him in the office and send troponin.  This may be mildly elevated given ESRD.  If it is significantly elevated, he will need to be admitted.  I think that he is probably going to need a cath given ongoing chest pain episodes.  He is on Eliquis and has ESRD, ideally would hold Eliquis 3 days prior to cath.  If troponin not elevated, can come back as an outpatient next week.  If troponin elevated, will admit and likely plan for procedure Monday after holding Eliquis an appropriate amount of time. We discussed risks/benefits and patient agreed to procedure.  Will determine timing based on troponin.   - Off ASA given Eliquis use.   - Continue Plavix for now, has been 1 year since last PCI so if cath does not show progressive disease in need of PCI, should be able to stop Plavix at this point.     - Increase Imdur to 90 mg daily.  - Continue atorvastatin 80 mg daily.  2. Hyperlipidemia: Continue atorvastatin and Zetia.  Good LDL in 2/20.      3. Chronic diastolic CHF:  Ischemic cardiomyopathy.  EF low in past but improved after CABG. Most recent echo 7/19 with EF 50%.    - On dialysis for volume control.  - Continue Toprol XL 25 mg at bed time as well as losartan.  - I will arrange for repeat  echo.  4. Atrial fibrillation: Paroxysmal, NSR today.  He will need to hold Eliquis, ideally x 3 days, prior to cath.  5. HTN: BP mildly high today, follow for now.  6. ESRD: MWF.  7. Carotid bruit: Mild carotid disease on dopplers in 1/20. 8. PAD: Normal ABIs in 4/20.    Awaiting troponin to determine plan.   Loralie Champagne 04/13/2019  HF Troponin 166. Discussed with Dr Aundra Dubin. He instructed to admit. I provided the patient with the plan via [place]. He is agreeable to admit. Admit today to cycle troponin. Hold eliquis with plan to start heparin drip per pharmacy. Consult Nephrology.   Makynzee Tigges NP-C  2:24 PM

## 2019-04-13 NOTE — Addendum Note (Signed)
Encounter addended by: Larey Dresser, MD on: 04/13/2019 2:38 PM  Actions taken: Charge Capture section accepted

## 2019-04-13 NOTE — Consult Note (Addendum)
[facility] Renal Consultation Note    Indication for Consultation:  Management of ESRD/hemodialysis, anemia, hypertension/volume, and secondary hyperparathyroidism. PCP:  HPI: Kenneth [name] is a 63 y.o. male with a history of ESRD on dialysis, CAN, HTN and GERD who was admitted today for chest pain. Patient reports he has had chest pain with associated mild SOB for 1 week. It initially occurred during a hypoglycemic episode. It is described as intermittent pressure in the center of his chest. He does have some dizziness when the pain occurs. He was seen by  His cardiologist today and troponin was ordered, which was elevated. He was therefore admitted and current plan is for heart cath on Monday after eliquis is held for an appropriate amount of time.   Patient dialyzes on MWF schedule at [facility]. His last HD was yesterday. He has been getting close to his EDW and denies any issues with dialysis. Normally has net UF of about 3-4L. Hospital labs are not yet available but last outpatient labs reveal Hgb 9.4, calcium 9.2, PTH 236, phos 4.5, potassium 5.1. Patient reports he continues to have some chest pain and SOB. Denies dizziness, weakness, fevers/chills, abdominal pain, N/V/D, and peripheral edema. Denies recent issues with dialysis access including bleeding. Very stable HD patient. When I see him he is watching TV without CP.  Says "sometimes" has CP with dialysis  Past Medical History:  Diagnosis Date  . Acute pulmonary edema ([place]) 05/31/2016  . Acute respiratory failure with hypoxemia ([place]) 05/31/2016  . CAD (coronary artery disease) 10/30/2016   NSTEMI 9/17 with CABG x 3 (LIMA-LAD, SVG-OM, SVG-RCA).   - NSTEMI 10/18 s/p DES to ostial SVG to  OM  . Diabetic foot ulcer ([place]) 04/11/2017  . 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 ESRD (end stage renal disease) on dialysis [facility])    "MWF; [facility]" (03/30/2018)  . Gastroesophageal reflux disease 08/18/2016  . HCAP  (healthcare-associated pneumonia)   . Hemodialysis status ([place])   . Hypertensive heart disease 09/12/2017  . Ischemic rest pain of lower extremity ([place]) 02/06/2015  . 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 NSTEMI (non-ST elevated myocardial infarction) ([place])   . Pleural effusion   . Pronation deformity of both feet 02/27/2015   Past Surgical History:  Procedure Laterality Date  . AV FISTULA PLACEMENT Left 06/22/2016   Procedure: ARTERIOVENOUS (AV) FISTULA CREATION LEFT UPPER ARM;  Surgeon: Rosetta Posner, MD;  Location: [place];  Service: Vascular;  Laterality: Left;  . CARDIAC CATHETERIZATION N/A 05/31/2016   Procedure: Left Heart Cath and Coronary Angiography;  Surgeon: Jettie Booze, MD;  Location: [place] CV LAB;  Service: Cardiovascular;  Laterality: N/A;  . CARDIAC CATHETERIZATION N/A 05/31/2016   Procedure: Right Heart Cath;  Surgeon: Jettie Booze, MD;  Location: [place] CV LAB;  Service: Cardiovascular;  Laterality: N/A;  . CARDIAC CATHETERIZATION N/A 05/31/2016   Procedure: IABP Insertion;  Surgeon: Jettie Booze, MD;  Location: [place] CV LAB;  Service: Cardiovascular;  Laterality: N/A;  . CORONARY ARTERY BYPASS GRAFT N/A 06/05/2016   Procedure: CORONARY ARTERY BYPASS GRAFTING (CABG) x3 LIMA to LAD -SVG to OM -SVG to RCA;  Surgeon: Ivin Poot, MD;  Location: [place];  Service: Open Heart Surgery;  Laterality: N/A;  . CORONARY STENT INTERVENTION N/A 07/07/2017   Procedure: CORONARY STENT INTERVENTION;  Surgeon: Wellington Hampshire, MD;  Location: [place] CV LAB;  Service: Cardiovascular;  Laterality: N/A;  . CORONARY STENT INTERVENTION  N/A 03/30/2018   Procedure: CORONARY STENT INTERVENTION;  Surgeon: Martinique, Peter M, MD;  Location: [place] CV LAB;  Service: Cardiovascular;  Laterality: N/A;  . INSERTION OF DIALYSIS CATHETER N/A 06/05/2016   Procedure: INSERTION OF DIALYSIS/trialysis CATHETER;  Surgeon: Ivin Poot, MD;  Location: [place];  Service: Vascular;  Laterality: N/A;  . INSERTION OF DIALYSIS CATHETER Right 06/13/2016   Procedure: INSERTION OF DIALYSIS CATHETER RIGHT INTERNAL JUGULAR;  Surgeon: Conrad Nelliston, MD;  Location: [place];  Service: Vascular;  Laterality: Right;  . INTRAOPERATIVE TRANSESOPHAGEAL ECHOCARDIOGRAM N/A 06/05/2016   Procedure: INTRAOPERATIVE TRANSESOPHAGEAL ECHOCARDIOGRAM;  Surgeon: Ivin Poot, MD;  Location: [place];  Service: Open Heart Surgery;  Laterality: N/A;  . LEFT HEART CATH AND CORS/GRAFTS ANGIOGRAPHY N/A 11/03/2016   Procedure: Left Heart Cath and Cors/Grafts Angiography;  Surgeon: Troy Sine, MD;  Location: [place] CV LAB;  Service: Cardiovascular;  Laterality: N/A;  . LEFT HEART CATH AND CORS/GRAFTS ANGIOGRAPHY N/A 07/07/2017   Procedure: LEFT HEART CATH AND CORS/GRAFTS ANGIOGRAPHY;  Surgeon: Larey Dresser, MD;  Location: [place] CV LAB;  Service: Cardiovascular;  Laterality: N/A;  . LEFT HEART CATH AND CORS/GRAFTS ANGIOGRAPHY N/A 03/30/2018   Procedure: LEFT HEART CATH AND CORS/GRAFTS ANGIOGRAPHY;  Surgeon: Martinique, Peter M, MD;  Location: [place] CV LAB;  Service: Cardiovascular;  Laterality: N/A;   Family History  Problem Relation Age of Onset  . CAD Father   . Colon cancer Father   . Diabetes Brother    Social History:  reports that he quit smoking about 6 years ago. His smoking use included cigarettes. He has a 52.50 pack-year smoking history. He has never used smokeless tobacco. He reports that he does not drink alcohol or use drugs.  ROS: As per HPI otherwise negative.  Physical Exam: Vitals:   04/13/19 1629  BP: (!) 170/77  Pulse: (!) 55  Temp: (!) 97.5 F (36.4 C)  TempSrc: Oral  SpO2: 97%  Weight: 67 kg     General: Well developed, well nourished, sitting on bedside in no acute distress. Head: Normocephalic, atraumatic, sclera non-icteric, mucus membranes are moist. Neck: JVD not elevated. Lungs: Clear bilaterally to auscultation without  wheezes, rales, or rhonchi. Breathing is unlabored. Heart: RRR with normal S1, S2. No murmurs, rubs, or gallops appreciated. Abdomen: Soft, non-tender, non-distended with normoactive bowel sounds. No rebound tenderness/guarding.  Musculoskeletal:  Strength and tone appear normal for age. Lower extremities: No edema or ischemic changes, no open wounds. Neuro: Alert and oriented X 3. Moves all extremities spontaneously. Psych:  Responds to questions appropriately with a normal affect. Dialysis Access: LUE AVF + bruit  No Known Allergies Prior to Admission medications   Medication Sig Start Date End Date Taking? Authorizing Provider  acetaminophen (TYLENOL) 500 MG tablet Take 500 mg by mouth every 6 (six) hours as needed for mild pain.    [provider]  amLODipine (NORVASC) 10 MG tablet Take 10 mg by mouth daily.    [provider]  atorvastatin (LIPITOR) 80 MG tablet Take 1 tablet (80 mg total) by mouth daily. 07/05/18 11/11/19  Larey Dresser, MD  calcium acetate (PHOSLO) 667 MG capsule Take 667 mg by mouth 3 (three) times daily with meals.  07/09/17   [provider]  citalopram (CELEXA) 20 MG tablet Take 20 mg by mouth daily. 02/25/17   [provider]  clopidogrel (PLAVIX) 75 MG tablet Take 1 tablet (75 mg total) by mouth daily. 04/04/18  [place], [place], [place]  ELIQUIS 5 MG TABS tablet TAKE 1 TABLET BY MOUTH TWICE DAILY 11/04/18   Larey Dresser, MD  ezetimibe (ZETIA) 10 MG tablet Take 1 tablet by mouth once daily 02/13/19   Larey Dresser, MD  gabapentin (NEURONTIN) 100 MG capsule Take 100 mg by mouth at bedtime.  02/11/17   [provider]  Insulin Human (INSULIN PUMP) SOLN Inject into the skin continuous. NOVOLOG    [provider]  isosorbide mononitrate (IMDUR) 60 MG 24 hr tablet Take 1.5 tablets (90 mg total) by mouth daily. 04/13/19   Larey Dresser, MD  losartan (COZAAR) 100 MG tablet Take 100 mg by mouth daily.     [provider]  metoprolol succinate (TOPROL-XL) 25 MG 24 hr tablet Take 1 tablet (25 mg total) by mouth at bedtime. Patient taking differently: Take 12.5 mg by mouth at bedtime.  04/19/18   Shirley Friar, PA-C  NOVOLOG 100 UNIT/ML injection MEDTRONIC 630 G PUMP BASAL 12A 0.7 6A 0.85 10P 0.7 BOLUS 12A 12 11A 10. ISF 60. INJECT TOTAL OF 60 UNITS SUBCUTANEOUSLY DAILY 01/08/19   [provider]  ofloxacin (OCUFLOX) 0.3 % ophthalmic solution INSTILL 1 DROP INTO RIGHT EYE 4 TIMES DAILY FOR 5 DAYS FOLLOWING INJECTION 02/15/19   [provider]  pantoprazole (PROTONIX) 40 MG tablet Take 40 mg by mouth daily.  01/07/17   [provider]   No current facility-administered medications for this encounter.    Labs: Basic Metabolic Panel: No results for input(s): NA, K, CL, CO2, GLUCOSE, BUN, CREATININE, CALCIUM, PHOS in the last 168 hours.  Invalid input(s): ALB Liver Function Tests: No results for input(s): AST, ALT, ALKPHOS, BILITOT, PROT, ALBUMIN in the last 168 hours. No results for input(s): LIPASE, AMYLASE in the last 168 hours. No results for input(s): AMMONIA in the last 168 hours. CBC: No results for input(s): WBC, NEUTROABS, HGB, HCT, MCV, PLT in the last 168 hours. Cardiac Enzymes: No results for input(s): CKTOTAL, CKMB, CKMBINDEX, TROPONINI in the last 168 hours. CBG: No results for input(s): GLUCAP in the last 168 hours. Iron Studies: No results for input(s): IRON, TIBC, TRANSFERRIN, FERRITIN in the last 72 hours. Studies/Results: No results found.  Dialysis Orders: Center: [place] [facility] on MWF. Time: 3h 70min, 180NRe, 2K/2.25Ca, EDW 65.5kg, LUE AVF Heparin: 2400 unit bolus Sensipar 30 mg TIW  Hectorol 2 mcg TIW with dialysis Mircera 75 mcg q 2 weeks (last dose 04/12/2019)  Assessment/Plan: 1.  Chest pain: Intermittent chest pain for approximately 1 week. Seen by cardiologist on 04/13/2019 and troponin elevated, therefore he was admitted. Reports  current plan is cath on Monday after holding eliquis for an appropriate amount of time. Per cardiology. 2.  ESRD:  MWF at [facility] via AVF. No hospital labs available yet. Will plan HD tomorrow per regular schedule. 3.  Hypertension/volume: Blood pressure elevated on initial admission. Meds per cardiology. Does not appear volume overloaded on exam and no SOB at present, however does tend to have large fluid gains at outpatient unit. Will plan for HD tomorrow with UFG 3L.  4.  Anemia: Last hemoglobin 9.4. No bleeding reported. Not due for ESA yet. Will follow CBC 5.  Metabolic bone disease: Labs pending. Continue outpatient sensipar and hectorol. On calcium acetate 1 tab TID. Will adjust as needed pending lab results.  6.  Nutrition: Recommend renal diet with fluid restrictions. 7. A. Fib: On eliquis, per cardiology   Anice Paganini, PA-C 04/13/2019,  4:42 PM  [facility] Pager: [phone]  Patient seen and examined, agree with above note with above modifications. Compliant HD patient well known to me, does well.  Does have a history of CAD and notes recent history of chest pain with elevated troponin.  Best to rule out reversible cause of CP- plan to have heart cath on Monday.  Regarding BP- is on max dose amlodipine and cozaar as OP as well as toprol (often wonder about compliance with these meds).  UF with HD tomorrow Corliss Parish, MD 04/13/2019

## 2019-04-13 NOTE — Progress Notes (Signed)
PCP: Dr. Tasia Catchings [facility]) Cardiology: Dr. Aundra Dubin  63 y.o. 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  He was admitted in 9/17 with NSTEMI.  LHC showed 3VD and he had CABG x 3.  He had CKD stage IV when he was admitted and developed ESRD requiring HD while in the hospital.  He had transient atrial fibrillation post-CABG.  He developed cardiogenic shock requiring milrinone but was able to taper off.  Echo in 9/17 showed EF 35-40%.    Admitted 8/1 through 04/16/2017 with malignant hypertension due to medication noncompliance. Continues on HD.  HF M-W-F. Discharge weight 140 pounds.   Admitted 10/18 with chest pain and pulmonary edema. NSTEMI noted on admit. Echo showed preserved EF but + WMAs. Cath with 95% ostial SVG-OM1 stenosis with TIMI 2 flow in graft.  Patient had DES to SVG-OM1.   Admitted in 12/18 with atypical chest pain, suspect not cardiac.   NSTEMI in 7/19 with severe in-stent restenosis in SVG-OM1, had DES x 2 to native OM1.  Echo showed EF 50%.  Atrial fibrillation was noted this admission.   He has had a chronic pattern of atypical chest pain.  Cardiolite in 3/20 showed EF 54%, small fixed inferior defect with no ischemia.   He presents today for followup of CHF and CAD.  We had to use a phone interpreter.  He reports an episode of severe left-sided chest pain last Saturday in the setting of hypoglycemia (blood glucose in 40s), resolved when he raised his blood sugar. However, since that time, he has had on and off chest pain episodes, described as pressure in the center of his chest.  Pain is mild to moderate when it occurs.  No trigger, and it will eventually resolve on its own.  He continues on dialysis MWF.    ECG (personally reviewed): NSR, lateral TWIs (not significantly changed from prior).   Labs (12/18): LDL 64, HDL 31 Labs (7/19): LDL 36  Labs (2/20): hgb 11.3, LDL 59, HDL  28  ECG: NSR, LVH with repolarization abnormality and inferior Qs (similar to prior).   PMH: 1. Type II diabetes 2. HTN 3. ESRD 4. CAD: NSTEMI 9/17 with CABG x 3 (LIMA-LAD, SVG-OM, SVG-RCA).   - NSTEMI 10/18 s/p DES to ostial SVG-OM - NSTEMI 7/19: severe in-stent restenosis in SVG-OM, chronic total occlusion of SVG-RCA.  DES x 2 to native OM1.  - Cardiolite (3/20): EF 54%, small fixed inferior defect with no ischemia.  5. Chronic systolic CHF: Ischemic cardiomyopathy.   - Echo (9/17) with EF 35-40%, grade II diastolic dysfunction.  - Echo (1/18) with EF 60-65%, grade II diastolic dysfunction. - Echo (10/18) with EF 55%, Akinesis of the basal-midinferolateral, inferior, and inferoseptal myocardiam. Akinesis of the basal anteroseptal myocardium.  - Echo (7/19): EF 50%, moderate LVH, mildly decreased RV systolic function.  6. Atrial fibrillation: Paroxysmal.  Only noted post-op CABG.  7. ABIs normal in 12/17. 5/18 ABIs normal.  1/19 ABIs normal. 4/20 ABIs normal.  8. Carotid stenosis: Carotid dopplers (12/18) with < 50% right common carotid stenosis, 8-34% LICA stenosis.  - Carotid dopplers (1/20): < 50% BICA stenosis.  9. Atrial fibrillation: Paroxysmal.  10. Cerebrovascular disease: Occluded right PICA and vertebral on 7/19 CTA   SH: Married, lives in [place], 1 son, nonsmoker, no ETOH, speaks only [place].   FH: No premature CAD.  Review of systems complete and found to  be negative unless listed in HPI.    Current Outpatient Medications  Medication Sig Dispense Refill  . acetaminophen (TYLENOL) 500 MG tablet Take 500 mg by mouth every 6 (six) hours as needed for mild pain.    Marland Kitchen amLODipine (NORVASC) 10 MG tablet Take 10 mg by mouth daily.    Marland Kitchen atorvastatin (LIPITOR) 80 MG tablet Take 1 tablet (80 mg total) by mouth daily. 90 tablet 3  . calcium acetate (PHOSLO) 667 MG capsule Take 667 mg by mouth 3 (three) times daily with meals.     . citalopram (CELEXA) 20 MG tablet Take 20 mg by  mouth daily.  1  . clopidogrel (PLAVIX) 75 MG tablet Take 1 tablet (75 mg total) by mouth daily. 90 tablet 3  . ELIQUIS 5 MG TABS tablet TAKE 1 TABLET BY MOUTH TWICE DAILY 180 tablet 1  . ezetimibe (ZETIA) 10 MG tablet Take 1 tablet by mouth once daily 90 tablet 0  . gabapentin (NEURONTIN) 100 MG capsule Take 100 mg by mouth at bedtime.   0  . Insulin Human (INSULIN PUMP) SOLN Inject into the skin continuous. NOVOLOG    . isosorbide mononitrate (IMDUR) 60 MG 24 hr tablet Take 1.5 tablets (90 mg total) by mouth daily. 135 tablet 3  . losartan (COZAAR) 100 MG tablet Take 100 mg by mouth daily.     . metoprolol succinate (TOPROL-XL) 25 MG 24 hr tablet Take 1 tablet (25 mg total) by mouth at bedtime. (Patient taking differently: Take 12.5 mg by mouth at bedtime. ) 90 tablet 3  . NOVOLOG 100 UNIT/ML injection MEDTRONIC 630 G PUMP BASAL 12A 0.7 6A 0.85 10P 0.7 BOLUS 12A 12 11A 10. ISF 60. INJECT TOTAL OF 60 UNITS SUBCUTANEOUSLY DAILY    . ofloxacin (OCUFLOX) 0.3 % ophthalmic solution INSTILL 1 DROP INTO RIGHT EYE 4 TIMES DAILY FOR 5 DAYS FOLLOWING INJECTION    . pantoprazole (PROTONIX) 40 MG tablet Take 40 mg by mouth daily.   0   No current facility-administered medications for this encounter.    Vitals:   04/13/19 1218  BP: (!) 154/80  Pulse: (!) 52  SpO2: 98%  Weight: 67.5 kg (148 lb 12.8 oz)   Wt Readings from Last 3 Encounters:  04/13/19 67.5 kg (148 lb 12.8 oz)  11/15/18 65.8 kg (145 lb)  11/10/18 65.8 kg (145 lb)   General: NAD Neck: No JVD, no thyromegaly or thyroid nodule.  Lungs: Clear to auscultation bilaterally with normal respiratory effort. CV: Nondisplaced PMI.  Heart regular S1/S2, no S3/S4, no murmur.  No peripheral edema.  No carotid bruit.  Normal pedal pulses.  Abdomen: Soft, nontender, no hepatosplenomegaly, no distention.  Skin: Intact without lesions or rashes.  Neurologic: Alert and oriented x 3.  Psych: Normal affect. Extremities: No clubbing or cyanosis.   HEENT: Normal.   Assessment/Plan: 1. CAD: s/p CABG.  NSTEMI 10/18, cath with 95% ostial SVG-OM1 stenosis with TIMI 2 flow in graft, s/p DES to SVG-OM1 07/07/17. NSTEMI 7/19 with in-stent restenosis in the SVG-OM1, had DES x 2 to native OM1.   He has had chronic atypical chest pain episodes, Cardiolite in 3/20 showed no ischemia.  He had an episode of severe CP associated with hypoglycemia about a week ago, and since then, he has had on and off episodes of more mild chest pain with no trigger.  History is difficult with language barrier (phone interpretor for [place] today). ECG is unchanged.   - I will keep him in  the office and send troponin.  This may be mildly elevated given ESRD.  If it is significantly elevated, he will need to be admitted.  I think that he is probably going to need a cath given ongoing chest pain episodes.  He is on Eliquis and has ESRD, ideally would hold Eliquis 3 days prior to cath.  If troponin not elevated, can come back as an outpatient next week.  If troponin elevated, will admit and likely plan for procedure Monday after holding Eliquis an appropriate amount of time. We discussed risks/benefits and patient agreed to procedure.  Will determine timing based on troponin.   - Off ASA given Eliquis use.   - Continue Plavix for now, has been 1 year since last PCI so if cath does not show progressive disease in need of PCI, should be able to stop Plavix at this point.     - Increase Imdur to 90 mg daily.  - Continue atorvastatin 80 mg daily.  2. Hyperlipidemia: Continue atorvastatin and Zetia.  Good LDL in 2/20.      3. Chronic diastolic CHF:  Ischemic cardiomyopathy.  EF low in past but improved after CABG. Most recent echo 7/19 with EF 50%.    - On dialysis for volume control.  - Continue Toprol XL 25 mg at bed time as well as losartan.  - I will arrange for repeat echo.  4. Atrial fibrillation: Paroxysmal, NSR today.  He will need to hold Eliquis, ideally x 3 days, prior to  cath.  5. HTN: BP mildly high today, follow for now.  6. ESRD: MWF.  7. Carotid bruit: Mild carotid disease on dopplers in 1/20. 8. PAD: Normal ABIs in 4/20.    Awaiting troponin to determine plan.   Loralie Champagne 04/13/2019

## 2019-04-13 NOTE — Progress Notes (Signed)
ANTICOAGULATION CONSULT NOTE - Initial Consult  Pharmacy Consult for heparin Indication: atrial fibrillation  No Known Allergies  Patient Measurements: Weight: 147 lb 11.2 oz (67 kg)   Vital Signs: Temp: 97.5 F (36.4 C) (07/30 1629) Temp Source: Oral (07/30 1629) BP: 170/77 (07/30 1629) Pulse Rate: 55 (07/30 1629)  Labs: Recent Labs    04/13/19 1310 04/13/19 1630  APTT  --  41*  HEPARINUNFRC  --  >2.20*  TROPONINIHS 166*  --     CrCl cannot be calculated (Patient's most recent lab result is older than the maximum 21 days allowed.).   Medical History: Past Medical History:  Diagnosis Date  . Acute pulmonary edema ([place]) 05/31/2016  . Acute respiratory failure with hypoxemia ([place]) 05/31/2016  . CAD (coronary artery disease) 10/30/2016   NSTEMI 9/17 with CABG x 3 (LIMA-LAD, SVG-OM, SVG-RCA).   - NSTEMI 10/18 s/p DES to ostial SVG to  OM  . Diabetic foot ulcer ([place]) 04/11/2017  . 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 ESRD (end stage renal disease) on dialysis [facility])    "MWF; [place]" (03/30/2018)  . Gastroesophageal reflux disease 08/18/2016  . HCAP (healthcare-associated pneumonia)   . Hemodialysis status ([place])   . Hypertensive heart disease 09/12/2017  . Ischemic rest pain of lower extremity ([place]) 02/06/2015  . 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 NSTEMI (non-ST elevated myocardial infarction) ([place])   . Pleural effusion   . Pronation deformity of both feet 02/27/2015   Assessment: 63 year old male with CAD, chronic systolic hear failure, CKD stage IV on HD MWF, NSTEMI, CABG and transient Afib presented to [facility] V[facility] specialty clinic for a cardiology follow-up. Admitted due to elevated troponin, 166. Patient taking Eliquis PTA, last dose taken 7/30 1700 (per patient), hold Eliquis. Nephrology consulted.  Goal of Therapy:  Heparin level 0.3-0.7 units/ml aPTT 66-102 seconds  Monitor platelets by anticoagulation protocol: Yes   Plan:   Hold Eliquis Start heparin at 950 units/hour 7/31 0500 Daily aPTT and HL Continue to monitor H&H and platelets   Morrisa Aldaba L. Devin Going, PharmD, [place] PGY1 Pharmacy Resident [employer]: [phone]  Mobile: [phone]  04/13/19 5:18 PM  Please check AMION for all [place] phone numbers After 10:00 PM, call the [place] [phone]

## 2019-04-14 ENCOUNTER — Inpatient Hospital Stay (HOSPITAL_COMMUNITY): Payer: Medicare Other

## 2019-04-14 DIAGNOSIS — I2510 Atherosclerotic heart disease of native coronary artery without angina pectoris: Secondary | ICD-10-CM

## 2019-04-14 DIAGNOSIS — I200 Unstable angina: Secondary | ICD-10-CM

## 2019-04-14 LAB — CBC
HCT: 30 % — ABNORMAL LOW (ref 39.0–52.0)
Hemoglobin: 10 g/dL — ABNORMAL LOW (ref 13.0–17.0)
MCH: 31.5 pg (ref 26.0–34.0)
MCHC: 33.3 g/dL (ref 30.0–36.0)
MCV: 94.6 fL (ref 80.0–100.0)
Platelets: 127 10*3/uL — ABNORMAL LOW (ref 150–400)
RBC: 3.17 MIL/uL — ABNORMAL LOW (ref 4.22–5.81)
RDW: 15.3 % (ref 11.5–15.5)
WBC: 5 10*3/uL (ref 4.0–10.5)
nRBC: 0 % (ref 0.0–0.2)

## 2019-04-14 LAB — ECHOCARDIOGRAM COMPLETE: Weight: 2278.67 oz

## 2019-04-14 LAB — BASIC METABOLIC PANEL
Anion gap: 14 (ref 5–15)
BUN: 45 mg/dL — ABNORMAL HIGH (ref 8–23)
CO2: 28 mmol/L (ref 22–32)
Calcium: 9.3 mg/dL (ref 8.9–10.3)
Chloride: 89 mmol/L — ABNORMAL LOW (ref 98–111)
Creatinine, Ser: 8.61 mg/dL — ABNORMAL HIGH (ref 0.61–1.24)
GFR calc Af Amer: 7 mL/min — ABNORMAL LOW (ref >60)
GFR calc non Af Amer: 6 mL/min — ABNORMAL LOW (ref >60)
Glucose, Bld: 160 mg/dL — ABNORMAL HIGH (ref 70–99)
Potassium: 5.9 mmol/L — ABNORMAL HIGH (ref 3.5–5.1)
Sodium: 131 mmol/L — ABNORMAL LOW (ref 135–145)

## 2019-04-14 LAB — HEMOGLOBIN A1C
Hgb A1c MFr Bld: 7 % — ABNORMAL HIGH (ref 4.8–5.6)
Mean Plasma Glucose: 154.2 mg/dL

## 2019-04-14 LAB — GLUCOSE, CAPILLARY
Glucose-Capillary: 142 mg/dL — ABNORMAL HIGH (ref 70–99)
Glucose-Capillary: 154 mg/dL — ABNORMAL HIGH (ref 70–99)

## 2019-04-14 LAB — APTT: aPTT: 68 seconds — ABNORMAL HIGH (ref 24–36)

## 2019-04-14 LAB — HIV ANTIBODY (ROUTINE TESTING W REFLEX): HIV Screen 4th Generation wRfx: NONREACTIVE

## 2019-04-14 LAB — MRSA PCR SCREENING: MRSA by PCR: NEGATIVE

## 2019-04-14 MED ORDER — DOXERCALCIFEROL 4 MCG/2ML IV SOLN
INTRAVENOUS | Status: AC
  Administered 2019-04-14: 2 ug via INTRAVENOUS
  Filled 2019-04-14: qty 2

## 2019-04-14 MED ORDER — HYDRALAZINE HCL 25 MG PO TABS
25.0000 mg | ORAL_TABLET | Freq: Once | ORAL | Status: AC
  Administered 2019-04-14: 25 mg via ORAL
  Filled 2019-04-14: qty 1

## 2019-04-14 MED ORDER — INSULIN ASPART 100 UNIT/ML ~~LOC~~ SOLN
0.0000 [IU] | Freq: Three times a day (TID) | SUBCUTANEOUS | Status: DC
  Administered 2019-04-15 (×2): 1 [IU] via SUBCUTANEOUS
  Administered 2019-04-15 – 2019-04-16 (×2): 2 [IU] via SUBCUTANEOUS
  Administered 2019-04-16 – 2019-04-17 (×4): 1 [IU] via SUBCUTANEOUS

## 2019-04-14 NOTE — Progress Notes (Signed)
ANTICOAGULATION CONSULT NOTE  Pharmacy Consult for heparin Indication: atrial fibrillation  No Known Allergies  Patient Measurements: Weight: 142 lb 6.7 oz (64.6 kg)   Vital Signs: Temp: 98.6 F (37 C) (07/31 1144) Temp Source: Oral (07/31 1144) BP: 145/59 (07/31 1250) Pulse Rate: 60 (07/31 1250)  Labs: Recent Labs    04/13/19 1630 04/13/19 1853 04/13/19 2106 04/13/19 2255 04/14/19 0603 04/14/19 1147  APTT 41*  --   --   --   --  68*  HEPARINUNFRC >2.20*  --   --   --   --   --   CREATININE  --   --   --   --  8.61*  --   TROPONINIHS  --  166* 153* 139*  --   --     Estimated Creatinine Clearance: 8.2 mL/min (A) (by C-G formula based on SCr of 8.61 mg/dL (H)).  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 Admitted due to elevated troponin, 166. Patient taking Eliquis PTA, last dose taken 7/30 1700 (per patient). Now transitioned to IV heparin. Initial aPTT is therapeutic at 68. Planning cardiac cath on Monday.   Goal of Therapy:  Heparin level 0.3-0.7 units/ml aPTT 66-102 seconds  Monitor platelets by anticoagulation protocol: Yes   Plan:  Continue heparin gtt 950 units/hr Check another aPTT tonight confirm dosing Daily heparin level, aPTT and CBC  Salome Arnt, PharmD, BCPS Please see AMION for all pharmacy numbers 04/14/2019 12:58 PM

## 2019-04-14 NOTE — Progress Notes (Signed)
  Echocardiogram 2D Echocardiogram has been performed.  Kenneth [name] 04/14/2019, 2:47 PM

## 2019-04-14 NOTE — Progress Notes (Signed)
Notified OP HD clinic of patient's admission and negative COVID 19 rapid test result in order to provide continuity of care and safety.  Alphonzo Cruise, [place] Renal Navigator [phone]

## 2019-04-14 NOTE — Progress Notes (Signed)
Patient ID: Kenneth [name], male   DOB: [birth year], 63 y.o.   MRN: [id number]     Advanced Heart Failure Rounding Note  PCP-Cardiologist: Loralie Champagne, MD   Subjective:    Patient continues to have "mild" chest pressure, not anything like his chest pain last weekend.    Seen at HD this morning.   TnI 166 => 153 => 139  ECG showed lateral TWIs, not markedly different than prior.    Objective:   Weight Range: 66.8 kg Body mass index is 23.07 kg/m.   Vital Signs:   Temp:  [97.5 F (36.4 C)-98 F (36.7 C)] (P) 97.9 F (36.6 C) (07/31 0805) Pulse Rate:  [54-56] (P) 56 (07/31 0845) Resp:  [13-19] (P) 17 (07/31 0845) BP: (162-173)/(51-77) (P) 160/58 (07/31 0845) SpO2:  [95 %-97 %] 95 % (07/31 0532) Weight:  [66.8 kg-67 kg] 66.8 kg (07/31 0206) Last BM Date: 04/13/19  Weight change: Filed Weights   04/13/19 1629 04/14/19 0206  Weight: 67 kg 66.8 kg    Intake/Output:   Intake/Output Summary (Last 24 hours) at 04/14/2019 0924 Last data filed at 04/14/2019 0910 Gross per 24 hour  Intake 600 ml  Output --  Net 600 ml      Physical Exam    General:  Well appearing. No resp difficulty HEENT: Normal Neck: Supple. JVP 8 cm. Carotids 2+ bilat; no bruits. No lymphadenopathy or thyromegaly appreciated. Cor: PMI nondisplaced. Regular rate & rhythm. No rubs, gallops or murmurs. Lungs: Clear Abdomen: Soft, nontender, nondistended. No hepatosplenomegaly. No bruits or masses. Good bowel sounds. Extremities: No cyanosis, clubbing, rash, edema Neuro: Alert & orientedx3, cranial nerves grossly intact. moves all 4 extremities w/o difficulty. Affect pleasant   Telemetry   NSR 50s  Labs    CBC No results for input(s): WBC, NEUTROABS, HGB, HCT, MCV, PLT in the last 72 hours. Basic Metabolic Panel Recent Labs    04/14/19 0603  NA 131*  K 5.9*  CL 89*  CO2 28  GLUCOSE 160*  BUN 45*  CREATININE 8.61*  CALCIUM 9.3   Liver Function Tests No results for input(s): AST,  ALT, ALKPHOS, BILITOT, PROT, ALBUMIN in the last 72 hours. No results for input(s): LIPASE, AMYLASE in the last 72 hours. Cardiac Enzymes No results for input(s): CKTOTAL, CKMB, CKMBINDEX, TROPONINI in the last 72 hours.  BNP: BNP (last 3 results) Recent Labs    05/04/18 1515  BNP 1,423.3*    ProBNP (last 3 results) No results for input(s): PROBNP in the last 8760 hours.   D-Dimer No results for input(s): DDIMER in the last 72 hours. Hemoglobin A1C No results for input(s): HGBA1C in the last 72 hours. Fasting Lipid Panel No results for input(s): CHOL, HDL, LDLCALC, TRIG, CHOLHDL, LDLDIRECT in the last 72 hours. Thyroid Function Tests No results for input(s): TSH, T4TOTAL, T3FREE, THYROIDAB in the last 72 hours.  Invalid input(s): FREET3  Other results:   Imaging     No results found.   Medications:     Scheduled Medications:  amLODipine  10 mg Oral Daily   atorvastatin  80 mg Oral Daily   calcium acetate  667 mg Oral TID WC   calcium acetate  667 mg Oral TID WC   Chlorhexidine Gluconate Cloth  6 each Topical Q0600   cinacalcet  30 mg Oral Q M,W,F-HD   citalopram  20 mg Oral Daily   clopidogrel  75 mg Oral Daily   doxercalciferol  2 mcg Intravenous Q M,W,F-HD  ezetimibe  10 mg Oral Daily   isosorbide mononitrate  90 mg Oral Daily   losartan  100 mg Oral Daily   metoprolol succinate  12.5 mg Oral QHS   pantoprazole  40 mg Oral Daily     Infusions:  sodium chloride     heparin 950 Units/hr (04/14/19 0528)     PRN Medications:  sodium chloride, acetaminophen, ondansetron (ZOFRAN) IV    Assessment/Plan   1. CAD: s/p CABG.  NSTEMI 10/18, cath with 95% ostial SVG-OM1 stenosis with TIMI 2 flow in graft, s/p DES to SVG-OM1 07/07/17. NSTEMI 7/19 with in-stent restenosis in the SVG-OM1, had DES x 2 to native OM1.   He has had chronic atypical chest pain episodes, Cardiolite in 3/20 showed no ischemia.  He had an episode of severe CP  associated with hypoglycemia about a week ago, and since then, he has had on and off episodes of more mild chest pain with no trigger.  ECG is unchanged.  I admitted him on 7/30 due to mildly elevated troponin.  There was no trend, so possibly this is related to ESRD, but with ongoing episodes of mild chest pain, I think he will end up needing cardiac cath.  - He has been on Eliquis and has ESRD, ideally would hold Eliquis 3 days prior to cath. Will plan for procedure Monday after holding Eliquis. We have discussed risks/benefits and patient agreed to procedure.     - Will cover with heparin gtt while off Eliquis and will give ASA 81 for now.  - Continue Plavix for now, has been 1 year since last PCI so if cath does not show progressive disease in need of PCI, should be able to stop Plavix at this point.     - Increased Imdur to 90 mg daily.  - Continue atorvastatin 80 mg daily.  2. Hyperlipidemia: Continue atorvastatin and Zetia.  Good LDL in 2/20.      3. Chronic diastolic CHF:  Ischemic cardiomyopathy.  EF low in past but improved after CABG. Most recent echo 7/19 with EF 50%.    - On dialysis for volume control.  - Continue Toprol XL 25 mg at bed time as well as losartan.  - I will arrange for repeat echo.  4. Atrial fibrillation: Paroxysmal, NSR today.  He will need to hold Eliquis, ideally x 3 days, prior to cath.  5. HTN: BP mildly high today but going for HD this morning, will reassess afterwards.   6. ESRD: MWF.  7. Carotid bruit: Mild carotid disease on dopplers in 1/20. 8. PAD: Normal ABIs in 4/20.    Length of Stay: 1  Loralie Champagne, MD  04/14/2019, 9:24 AM  Advanced Heart Failure Team Pager [phone] (M-F; 7a - 4p)  Please contact [facility] for night-coverage after hours (4p -7a ) and weekends on [email or web address]

## 2019-04-14 NOTE — Progress Notes (Signed)
[facility] Progress Note   Subjective:   Patient seen on HD. Reports he is feeling well. Has chest pain and dizziness "sometimes" but none at present. Denies SOB, cough, dyspnea, edema, abdominal pain, N/V/D. BP soft on dialysis this AM, UF goal reduced to 2.5L.   Objective Vitals:   04/13/19 1945 04/14/19 0126 04/14/19 0206 04/14/19 0532  BP: (!) 173/59 (!) 170/51  (!) 162/58  Pulse: (!) 55 (!) 56  (!) 54  Resp: 18 18  18   Temp: 97.8 F (36.6 C) 98 F (36.7 C)  (!) 97.5 F (36.4 C)  TempSrc: Oral Oral  Oral  SpO2: 97% 95%  95%  Weight:   66.8 kg    Physical Exam General: Well developed male, alert and in NAD Heart: RRR. No murmurs, rubs or gallops Lungs: CTA bilaterally without wheezing, rhonchi or rales Abdomen: Soft, non-tender, non-distended. +BS Extremities: No edema b/l lower extremities Dialysis Access: LUE AVF cannulated  Additional Objective Labs: Basic Metabolic Panel: Recent Labs  Lab 04/14/19 0603  NA 131*  K 5.9*  CL 89*  CO2 28  GLUCOSE 160*  BUN 45*  CREATININE 8.61*  CALCIUM 9.3   Blood Culture    Component Value Date/Time   SDES  10/13/2017 1552    TOE LEFT FOURTH Performed at [facility], [place] [address] [place], [place] [address]    [facility]  10/13/2017 1552    NONE Performed at [facility], [address] [place], [place] [address]    CULT  10/13/2017 1552    ABUNDANT METHICILLIN RESISTANT STAPHYLOCOCCUS AUREUS   REPTSTATUS 10/16/2017 FINAL 10/13/2017 1552    CBG: Recent Labs  Lab 04/14/19 0544  GLUCAP 142*   Medications: . sodium chloride    . heparin 950 Units/hr (04/14/19 0528)   . amLODipine  10 mg Oral Daily  . atorvastatin  80 mg Oral Daily  . calcium acetate  667 mg Oral TID WC  . calcium acetate  667 mg Oral TID WC  . Chlorhexidine Gluconate Cloth  6 each Topical Q0600  . cinacalcet  30 mg Oral Q M,W,F-HD  . citalopram  20 mg Oral Daily  . clopidogrel  75 mg Oral Daily  .  doxercalciferol  2 mcg Intravenous Q M,W,F-HD  . ezetimibe  10 mg Oral Daily  . isosorbide mononitrate  90 mg Oral Daily  . losartan  100 mg Oral Daily  . metoprolol succinate  12.5 mg Oral QHS  . pantoprazole  40 mg Oral Daily    Dialysis Orders: Center: [facility] on MWF. Time: 3h 28min, 180NRe, 2K/2.25Ca, EDW 65.5kg, LUE AVF Heparin: 2400 unit bolus Sensipar 30 mg TIW  Hectorol 2 mcg TIW with dialysis Mircera 75 mcg q 2 weeks (last dose 04/12/2019)  Assessment/Plan: 1. Chest pain: Intermittent chest pain for approximately 1 week. Seen by cardiologist on 04/13/2019 and troponin elevated, therefore he was admitted. Reports current plan is cath on Monday after holding eliquis for an appropriate amount of time. Denying any chest pain at present. Per cardiology.  2.  ESRD:  MWF at [facility] via AVF. Stable outpatient HD patient.  K+ elevated this AM at 5.9, expect improvement with HD today, 2K bath.  Continue MWF schedule.  3.  Hypertension/volume: Blood pressure elevated on initial admission. Some question about compliance of outpatient BP meds according to outpatient nephrologist. Meds recently titrated per cardiology. Does not appear volume overloaded on exam and no SOB at present, BP now running  soft on dialysis. Received a dose of hydralazine once this AM. Will reduce goal to 2.5L with dialysis today. Follow BP.  4.  Anemia: Last hemoglobin 9.4. No bleeding reported. Not due for ESA yet. No CBC results yet this AM. 5.  Metabolic bone disease: Calcium 9.3. Continue outpatient sensipar and hectorol. Continue calcium acetate 1 tab TID.  6.  Nutrition: Recommend renal diet with fluid restrictions. 7. A. Fib: On eliquis, per cardiology   Anice Paganini, PA-C 04/14/2019, 8:38 AM  [facility] Pager: [phone]

## 2019-04-14 NOTE — Progress Notes (Signed)
In conversation patient mentioned that he has insulin pump that he is using at home, no orders for ACHS or Insulin orders here. Paged PA Eulas Post and made him aware.

## 2019-04-14 NOTE — Progress Notes (Signed)
Pt is a &ox4 this morning. Pt is able to communicate in [place]. Pt stated that he has some minor chest pain (3/10)this morning, however, his sugar tends to drop in the morning to 50s-60s. Pt felt weakness and diaphoretic. CBG checked and was 142. Heparin gtt was started at around 5am. Pt is tolerating well. Will continue to monitor.   Fransico Michael, RN

## 2019-04-15 LAB — BASIC METABOLIC PANEL
Anion gap: 11 (ref 5–15)
BUN: 24 mg/dL — ABNORMAL HIGH (ref 8–23)
CO2: 28 mmol/L (ref 22–32)
Calcium: 8.8 mg/dL — ABNORMAL LOW (ref 8.9–10.3)
Chloride: 95 mmol/L — ABNORMAL LOW (ref 98–111)
Creatinine, Ser: 6.38 mg/dL — ABNORMAL HIGH (ref 0.61–1.24)
GFR calc Af Amer: 10 mL/min — ABNORMAL LOW (ref >60)
GFR calc non Af Amer: 9 mL/min — ABNORMAL LOW (ref >60)
Glucose, Bld: 160 mg/dL — ABNORMAL HIGH (ref 70–99)
Potassium: 4.6 mmol/L (ref 3.5–5.1)
Sodium: 134 mmol/L — ABNORMAL LOW (ref 135–145)

## 2019-04-15 LAB — RENAL FUNCTION PANEL
Albumin: 3.6 g/dL (ref 3.5–5.0)
Anion gap: 11 (ref 5–15)
BUN: 15 mg/dL (ref 8–23)
CO2: 28 mmol/L (ref 22–32)
Calcium: 8.8 mg/dL — ABNORMAL LOW (ref 8.9–10.3)
Chloride: 93 mmol/L — ABNORMAL LOW (ref 98–111)
Creatinine, Ser: 4.74 mg/dL — ABNORMAL HIGH (ref 0.61–1.24)
GFR calc Af Amer: 14 mL/min — ABNORMAL LOW (ref >60)
GFR calc non Af Amer: 12 mL/min — ABNORMAL LOW (ref >60)
Glucose, Bld: 211 mg/dL — ABNORMAL HIGH (ref 70–99)
Phosphorus: 3.1 mg/dL (ref 2.5–4.6)
Potassium: 5 mmol/L (ref 3.5–5.1)
Sodium: 132 mmol/L — ABNORMAL LOW (ref 135–145)

## 2019-04-15 LAB — GLUCOSE, CAPILLARY
Glucose-Capillary: 151 mg/dL — ABNORMAL HIGH (ref 70–99)
Glucose-Capillary: 145 mg/dL — ABNORMAL HIGH (ref 70–99)
Glucose-Capillary: 126 mg/dL — ABNORMAL HIGH (ref 70–99)
Glucose-Capillary: 159 mg/dL — ABNORMAL HIGH (ref 70–99)

## 2019-04-15 LAB — CBC
HCT: 28.1 % — ABNORMAL LOW (ref 39.0–52.0)
Hemoglobin: 9.5 g/dL — ABNORMAL LOW (ref 13.0–17.0)
MCH: 31.6 pg (ref 26.0–34.0)
MCHC: 33.8 g/dL (ref 30.0–36.0)
MCV: 93.4 fL (ref 80.0–100.0)
Platelets: 140 10*3/uL — ABNORMAL LOW (ref 150–400)
RBC: 3.01 MIL/uL — ABNORMAL LOW (ref 4.22–5.81)
RDW: 15.6 % — ABNORMAL HIGH (ref 11.5–15.5)
WBC: 5.3 10*3/uL (ref 4.0–10.5)
nRBC: 0 % (ref 0.0–0.2)

## 2019-04-15 LAB — HEPARIN LEVEL (UNFRACTIONATED): Heparin Unfractionated: >2.2 IU/mL — ABNORMAL HIGH (ref 0.30–0.70)

## 2019-04-15 LAB — APTT: aPTT: 76 seconds — ABNORMAL HIGH (ref 24–36)

## 2019-04-15 MED ORDER — ASPIRIN 81 MG PO CHEW
81.0000 mg | CHEWABLE_TABLET | Freq: Every day | ORAL | Status: DC
  Administered 2019-04-15 – 2019-04-16 (×2): 81 mg via ORAL
  Filled 2019-04-15 (×2): qty 1

## 2019-04-15 MED ORDER — AMLODIPINE BESYLATE 2.5 MG PO TABS: 2.5000 mg | ORAL_TABLET | Freq: Every day | ORAL | Status: DC

## 2019-04-15 NOTE — Progress Notes (Addendum)
Patient ID: Kenneth [name], male   DOB: August 31, 1956, 63 y.o.   MRN: [id number]     Advanced Heart Failure Rounding Note  PCP-Cardiologist: Loralie Champagne, MD   Subjective:    No further chest pain this morning.    Had HD yesterday.   SBP consistently elevated, 150s-170s.   TnI 166 => 153 => 139  ECG showed lateral TWIs, not markedly different than prior.   Echo: EF 50-55%, basal inferolateral HK, normal RV.    Objective:   Weight Range: 64.6 kg Body mass index is 22.32 kg/m.   Vital Signs:   Temp:  [98.2 F (36.8 C)-98.9 F (37.2 C)] 98.7 F (37.1 C) (08/01 0517) Pulse Rate:  [54-63] 60 (08/01 0800) Resp:  [14-19] 18 (08/01 0517) BP: (134-172)/(45-73) 172/54 (08/01 0800) SpO2:  [97 %-100 %] 100 % (08/01 0800) Weight:  [64.6 kg] 64.6 kg (08/01 0519) Last BM Date: 04/11/19  Weight change: Filed Weights   04/14/19 0805 04/14/19 1144 04/15/19 0519  Weight: 67.7 kg 64.6 kg 64.6 kg    Intake/Output:   Intake/Output Summary (Last 24 hours) at 04/15/2019 0917 Last data filed at 04/15/2019 0800 Gross per 24 hour  Intake 722.05 ml  Output 2500 ml  Net -1777.95 ml      Physical Exam    General: NAD Neck: No JVD, no thyromegaly or thyroid nodule.  Lungs: Clear to auscultation bilaterally with normal respiratory effort. CV: Nondisplaced PMI.  Heart regular S1/S2, no S3/S4, no murmur.  No peripheral edema.   Abdomen: Soft, nontender, no hepatosplenomegaly, no distention.  Skin: Intact without lesions or rashes.  Neurologic: Alert and oriented x 3.  Psych: Normal affect. Extremities: No clubbing or cyanosis.  HEENT: Normal.    Telemetry   NSR 50s  Labs    CBC Recent Labs    04/14/19 1314 04/15/19 0545  WBC 5.0 5.3  HGB 10.0* 9.5*  HCT 30.0* 28.1*  MCV 94.6 93.4  PLT 127* 841*   Basic Metabolic Panel Recent Labs    04/14/19 1635 04/15/19 0545  NA 132* 134*  K 5.0 4.6  CL 93* 95*  CO2 28 28  GLUCOSE 211* 160*  BUN 15 24*  CREATININE 4.74* 6.38*    CALCIUM 8.8* 8.8*  PHOS 3.1  --    Liver Function Tests Recent Labs    04/14/19 1635  ALBUMIN 3.6   No results for input(s): LIPASE, AMYLASE in the last 72 hours. Cardiac Enzymes No results for input(s): CKTOTAL, CKMB, CKMBINDEX, TROPONINI in the last 72 hours.  BNP: BNP (last 3 results) Recent Labs    05/04/18 1515  BNP 1,423.3*    ProBNP (last 3 results) No results for input(s): PROBNP in the last 8760 hours.   D-Dimer No results for input(s): DDIMER in the last 72 hours. Hemoglobin A1C Recent Labs    04/14/19 1314  HGBA1C 7.0*   Fasting Lipid Panel No results for input(s): CHOL, HDL, LDLCALC, TRIG, CHOLHDL, LDLDIRECT in the last 72 hours. Thyroid Function Tests No results for input(s): TSH, T4TOTAL, T3FREE, THYROIDAB in the last 72 hours.  Invalid input(s): FREET3  Other results:   Imaging    No results found.   Medications:     Scheduled Medications:  amLODipine  10 mg Oral Daily   amLODipine  2.5 mg Oral Daily   atorvastatin  80 mg Oral Daily   calcium acetate  667 mg Oral TID WC   calcium acetate  667 mg Oral TID WC   Chlorhexidine  Gluconate Cloth  6 each Topical Q0600   cinacalcet  30 mg Oral Q M,W,F-HD   citalopram  20 mg Oral Daily   clopidogrel  75 mg Oral Daily   doxercalciferol  2 mcg Intravenous Q M,W,F-HD   ezetimibe  10 mg Oral Daily   insulin aspart  0-9 Units Subcutaneous TID WC   isosorbide mononitrate  90 mg Oral Daily   losartan  100 mg Oral Daily   metoprolol succinate  12.5 mg Oral QHS   pantoprazole  40 mg Oral Daily    Infusions:  sodium chloride     heparin 950 Units/hr (04/15/19 0619)    PRN Medications: sodium chloride, acetaminophen, ondansetron (ZOFRAN) IV    Assessment/Plan   1. CAD: s/p CABG.  NSTEMI 10/18, cath with 95% ostial SVG-OM1 stenosis with TIMI 2 flow in graft, s/p DES to SVG-OM1 07/07/17. NSTEMI 7/19 with in-stent restenosis in the SVG-OM1, had DES x 2 to native OM1.   He  has had chronic atypical chest pain episodes, Cardiolite in 3/20 showed no ischemia.  He had an episode of severe CP associated with hypoglycemia about a week ago, and since then, he has had on and off episodes of more mild chest pain with no trigger.  ECG is unchanged.  I admitted him on 7/30 due to mildly elevated troponin.  There was no trend, so possibly this is related to ESRD, but with ongoing episodes of chest pain, I think he will end up needing cardiac cath.  - He has been on Eliquis and has ESRD, ideally would hold Eliquis 3 days prior to cath. Will plan for procedure Monday morning at 7:30 am after holding Eliquis. We have discussed risks/benefits and patient agreed to procedure.    - Will cover with heparin gtt while off Eliquis and will give ASA 81 for now.  - Continue Plavix for now, has been 1 year since last PCI so if cath does not show progressive disease in need of PCI, should be able to stop Plavix at this point.     - Increased Imdur to 90 mg daily.  - Continue atorvastatin 80 mg daily.  2. Hyperlipidemia: Continue atorvastatin and Zetia.  Good LDL in 2/20.      3. Chronic diastolic CHF:  Ischemic cardiomyopathy.  EF low in past but improved after CABG. Echo this admission with EF 50-55%, inferolateral hypokinesis (similar to prior).  - On dialysis for volume control.  - Continue Toprol XL 25 mg at bed time as well as losartan.   4. Atrial fibrillation: Paroxysmal, NSR today.  He will need to hold Eliquis, ideally x 3 days, prior to cath.  5. HTN: BP elevated.  Continue amlodipine and losartan for now.  6. ESRD: MWF.  7. Carotid bruit: Mild carotid disease on dopplers in 1/20. 8. PAD: Normal ABIs in 4/20.    Length of Stay: 2  Loralie Champagne, MD  04/15/2019, 9:17 AM  Advanced Heart Failure Team Pager [phone] (M-F; 7a - 4p)  Please contact [facility] for night-coverage after hours (4p -7a ) and weekends on [email or web address]

## 2019-04-15 NOTE — Progress Notes (Signed)
[facility] Progress Note   Dialysis Orders: Center:[facility] MWF. Time: 3h 44min, 180NRe, 2K/2.25Ca, EDW 65.5kg, LUE AVF 400/800 Heparin: 2400 unit bolus Sensipar 30 mg TIW  Hectorol 2 mcg TIW with dialysis Mircera 75 mcg q 2 weeks (last dose 04/12/2019) hgb 10.1 7/29   Assessment/Plan: 1. Chest pain hx CABG, and prior NSTEMIs; plan is cath on Monday after holding eliquis for 3 days. On heparin drip/plavix 2. ESRD:MWF K didn't clear much with HD Friday but is 4.6 today - BUN decreased roughly 67% between pre HD BUN Friday and post HD 6 hr lab. 3. Hypertension/volume:Hx chronic dCHF- EF 50 - 55% Net UF 2.5 Friday with post wt 64.4 about a kg below outpatient wt - only one SBP drop on HD yesterday - wonder if it was real since all others 140 - 160s.- suspect we can go down further on EDW (outpatient records indicated minimal gains Currently on amlodipine 10, losartan 100, MTP XL 12.5 - imdur 90  4. Anemia:Last hemoglobin 9.4. No bleeding reported. Not due for ESA yet. No CBC results yet this AM. 5. Metabolic bone disease: Continue outpatient sensipar and hectorol. Continue calcium acetate 1 tab TID.  6. Nutrition:Recommend renal diet with fluid restrictions. 7. A. Fib:  per cardiology 8. Access bleeding issues - will investigate prior history - no bleeding issues at present.- on heparin drip; last AF 1569 stable 7/17 - had intervention mid 01/26/19 at [facility] by Dr. Augustin Coupe  - had 90% arch stenosis that required a stent nad 70% ouflow ceph stenosis- will ask Dr. Posey Pronto to take a look at his access while here.  Myriam Jacobson, PA-C [place] [phone] 04/15/2019,10:04 AM  LOS: 2 days   Subjective:   Reports issues with bleeding around access needles on HD yesterday. Does not recall when he had his last intervention. Objective Vitals:   04/15/19 0517 04/15/19 0519 04/15/19 0800 04/15/19 0931  BP: (!) 155/60  (!) 172/54 (!) 162/61  Pulse: 60   60 (!) 55  Resp: 18   15  Temp: 98.7 F (37.1 C)   98.4 F (36.9 C)  TempSrc: Oral   Oral  SpO2: 97%  100% 99%  Weight:  64.6 kg     Physical Exam General: NAD sitting in chair watching video Heart: RRR Lungs: few crackles left base Abdomen: soft NT Extremities: no LE edema Dialysis Access: left upper AVF no bleeding + bruit somewhat pulsatile   Additional Objective Labs: Basic Metabolic Panel: Recent Labs  Lab 04/14/19 0603 04/14/19 1635 04/15/19 0545  NA 131* 132* 134*  K 5.9* 5.0 4.6  CL 89* 93* 95*  CO2 28 28 28   GLUCOSE 160* 211* 160*  BUN 45* 15 24*  CREATININE 8.61* 4.74* 6.38*  CALCIUM 9.3 8.8* 8.8*  PHOS  --  3.1  --    Liver Function Tests: Recent Labs  Lab 04/14/19 1635  ALBUMIN 3.6   No results for input(s): LIPASE, AMYLASE in the last 168 hours. CBC: Recent Labs  Lab 04/14/19 1314 04/15/19 0545  WBC 5.0 5.3  HGB 10.0* 9.5*  HCT 30.0* 28.1*  MCV 94.6 93.4  PLT 127* 140*   Blood Culture    Component Value Date/Time   SDES  10/13/2017 1552    TOE LEFT FOURTH Performed at [facility], [facility] [address], [place], [place] [address]    [place]  10/13/2017 1552    NONE Performed at [facility], [place],  [place],  [address]    CULT  10/13/2017 1552    ABUNDANT METHICILLIN RESISTANT STAPHYLOCOCCUS AUREUS   REPTSTATUS 10/16/2017 FINAL 10/13/2017 1552    Cardiac Enzymes: No results for input(s): CKTOTAL, CKMB, CKMBINDEX, TROPONINI in the last 168 hours. CBG: Recent Labs  Lab 04/14/19 0544 04/14/19 2100 04/15/19 0610  GLUCAP 142* 154* 151*   Iron Studies: No results for input(s): IRON, TIBC, TRANSFERRIN, FERRITIN in the last 72 hours. Lab Results  Component Value Date   INR 0.95 07/07/2017   INR 1.24 11/03/2016   INR 1.10 06/12/2016   Studies/Results: No results found. Medications: . sodium chloride    . heparin 950 Units/hr (04/15/19 0998)   . amLODipine  10 mg Oral Daily  . amLODipine  2.5  mg Oral Daily  . aspirin  81 mg Oral Daily  . atorvastatin  80 mg Oral Daily  . calcium acetate  667 mg Oral TID WC  . calcium acetate  667 mg Oral TID WC  . Chlorhexidine Gluconate Cloth  6 each Topical Q0600  . cinacalcet  30 mg Oral Q M,W,F-HD  . citalopram  20 mg Oral Daily  . clopidogrel  75 mg Oral Daily  . doxercalciferol  2 mcg Intravenous Q M,W,F-HD  . ezetimibe  10 mg Oral Daily  . insulin aspart  0-9 Units Subcutaneous TID WC  . isosorbide mononitrate  90 mg Oral Daily  . losartan  100 mg Oral Daily  . metoprolol succinate  12.5 mg Oral QHS  . pantoprazole  40 mg Oral Daily

## 2019-04-15 NOTE — Progress Notes (Signed)
[place] for heparin Indication: atrial fibrillation  No Known Allergies  Patient Measurements: Weight: 142 lb 8 oz (64.6 kg)   Vital Signs: Temp: 98.7 F (37.1 C) (08/01 0517) Temp Source: Oral (08/01 0517) BP: 155/60 (08/01 0517) Pulse Rate: 60 (08/01 0517)  Labs: Recent Labs    04/13/19 1630 04/13/19 1853 04/13/19 2106 04/13/19 2255 04/14/19 0603 04/14/19 1147 04/14/19 1314 04/14/19 1635 04/15/19 0545  HGB  --   --   --   --   --   --  10.0*  --  9.5*  HCT  --   --   --   --   --   --  30.0*  --  28.1*  PLT  --   --   --   --   --   --  127*  --  140*  APTT 41*  --   --   --   --  68*  --   --  76*  HEPARINUNFRC >2.20*  --   --   --   --   --   --   --  >2.20*  CREATININE  --   --   --   --  8.61*  --   --  4.74* 6.38*  TROPONINIHS  --  166* 153* 139*  --   --   --   --   --     Estimated Creatinine Clearance: 11.1 mL/min (A) (by C-G formula based on SCr of 6.38 mg/dL (H)).  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 Admitted due to elevated troponin,166. Planning cardiac cath on Monday.    Patient taking Eliquis PTA, last dose taken 7/30 @1700  (per patient). Now transitioned to IV heparin. Initial aPTT is therapeutic at 76. Heparin level is falsely elevated since the patient was taking Eliquis PTA. Will continue to rely on aPTT levels for dosing until the heparin level is no longer falsely elevated.  H&H low but stable at 9.5/28.1; platelets stable at 140. No signs of bleeding per nursing notes.  Goal of Therapy:  Heparin level 0.3-0.7 units/ml aPTT 66-102 seconds  Monitor platelets by anticoagulation protocol: Yes   Plan:  Continue heparin drip at 950 units/hr Daily heparin level, aPTT and CBC Monitor for s/sx of bleeding F/u plans for anticoagulation post cath  on 8/3  Kennon Holter, PharmD PGY1 Ambulatory Care Pharmacy Resident [employer] Phone: [phone] 04/15/2019 7:40 AM

## 2019-04-16 LAB — APTT: aPTT: 81 seconds — ABNORMAL HIGH (ref 24–36)

## 2019-04-16 LAB — GLUCOSE, CAPILLARY
Glucose-Capillary: 143 mg/dL — ABNORMAL HIGH (ref 70–99)
Glucose-Capillary: 142 mg/dL — ABNORMAL HIGH (ref 70–99)
Glucose-Capillary: 160 mg/dL — ABNORMAL HIGH (ref 70–99)
Glucose-Capillary: 158 mg/dL — ABNORMAL HIGH (ref 70–99)

## 2019-04-16 LAB — CBC
HCT: 28.4 % — ABNORMAL LOW (ref 39.0–52.0)
Hemoglobin: 9.6 g/dL — ABNORMAL LOW (ref 13.0–17.0)
MCH: 32 pg (ref 26.0–34.0)
MCHC: 33.8 g/dL (ref 30.0–36.0)
MCV: 94.7 fL (ref 80.0–100.0)
Platelets: 135 10*3/uL — ABNORMAL LOW (ref 150–400)
RBC: 3 MIL/uL — ABNORMAL LOW (ref 4.22–5.81)
RDW: 15.6 % — ABNORMAL HIGH (ref 11.5–15.5)
WBC: 5.4 10*3/uL (ref 4.0–10.5)
nRBC: 0 % (ref 0.0–0.2)

## 2019-04-16 LAB — BASIC METABOLIC PANEL
Anion gap: 14 (ref 5–15)
BUN: 38 mg/dL — ABNORMAL HIGH (ref 8–23)
CO2: 27 mmol/L (ref 22–32)
Calcium: 9.3 mg/dL (ref 8.9–10.3)
Chloride: 91 mmol/L — ABNORMAL LOW (ref 98–111)
Creatinine, Ser: 8.95 mg/dL — ABNORMAL HIGH (ref 0.61–1.24)
GFR calc Af Amer: 7 mL/min — ABNORMAL LOW (ref >60)
GFR calc non Af Amer: 6 mL/min — ABNORMAL LOW (ref >60)
Glucose, Bld: 168 mg/dL — ABNORMAL HIGH (ref 70–99)
Potassium: 4.8 mmol/L (ref 3.5–5.1)
Sodium: 132 mmol/L — ABNORMAL LOW (ref 135–145)

## 2019-04-16 LAB — HEPARIN LEVEL (UNFRACTIONATED): Heparin Unfractionated: 1.24 IU/mL — ABNORMAL HIGH (ref 0.30–0.70)

## 2019-04-16 MED ORDER — SODIUM CHLORIDE 0.9% FLUSH: 3.0000 mL | INTRAVENOUS | Status: DC | PRN

## 2019-04-16 MED ORDER — CHLORHEXIDINE GLUCONATE CLOTH 2 % EX PADS
6.0000 | MEDICATED_PAD | Freq: Every day | CUTANEOUS | Status: DC
  Administered 2019-04-16: 6 via TOPICAL

## 2019-04-16 MED ORDER — SODIUM CHLORIDE 0.9% FLUSH
3.0000 mL | Freq: Two times a day (BID) | INTRAVENOUS | Status: DC
  Administered 2019-04-17: 3 mL via INTRAVENOUS

## 2019-04-16 MED ORDER — ASPIRIN 81 MG PO CHEW
81.0000 mg | CHEWABLE_TABLET | ORAL | Status: AC
  Administered 2019-04-17: 81 mg via ORAL
  Filled 2019-04-16: qty 1

## 2019-04-16 MED ORDER — SODIUM CHLORIDE 0.9 % IV SOLN
INTRAVENOUS | Status: DC
  Administered 2019-04-17: 06:00:00 via INTRAVENOUS

## 2019-04-16 MED ORDER — SODIUM CHLORIDE 0.9 % IV SOLN: 250.0000 mL | INTRAVENOUS | Status: DC | PRN

## 2019-04-16 MED ORDER — CLOPIDOGREL BISULFATE 75 MG PO TABS
75.0000 mg | ORAL_TABLET | ORAL | Status: AC
  Administered 2019-04-17: 75 mg via ORAL
  Filled 2019-04-16: qty 1

## 2019-04-16 NOTE — H&P (View-Only) (Signed)
Patient ID: Kenneth [name], male   DOB: [birth year], 63 y.o.   MRN: [id number]     Advanced Heart Failure Rounding Note  PCP-Cardiologist: Loralie Champagne, MD   Subjective:    No further chest pain this morning.    Had HD Friday   SBP consistently elevated, 150s-170s.   TnI 166 => 153 => 139  ECG showed lateral TWIs, not markedly different than prior.   Echo: EF 50-55%, basal inferolateral HK, normal RV.    Objective:   Weight Range: 65.6 kg Body mass index is 22.65 kg/m.   Vital Signs:   Temp:  [98.4 F (36.9 C)-98.6 F (37 C)] 98.4 F (36.9 C) (08/02 0403) Pulse Rate:  [55-61] 61 (08/02 0403) Resp:  [15-18] 18 (08/02 0403) BP: (144-163)/(59-77) 149/66 (08/02 0403) SpO2:  [94 %-100 %] 98 % (08/02 0403) Weight:  [65.6 kg] 65.6 kg (08/02 0500) Last BM Date: 04/13/19  Weight change: Filed Weights   04/15/19 0519 04/16/19 0018 04/16/19 0500  Weight: 64.6 kg 65.6 kg 65.6 kg    Intake/Output:   Intake/Output Summary (Last 24 hours) at 04/16/2019 0808 Last data filed at 04/15/2019 2300 Gross per 24 hour  Intake 502.23 ml  Output 0 ml  Net 502.23 ml      Physical Exam    General: NAD Neck: No JVD, no thyromegaly or thyroid nodule.  Lungs: Clear to auscultation bilaterally with normal respiratory effort. CV: Nondisplaced PMI.  Heart regular S1/S2, no S3/S4, no murmur.  No peripheral edema.   Abdomen: Soft, nontender, no hepatosplenomegaly, no distention.  Skin: Intact without lesions or rashes.  Neurologic: Alert and oriented x 3.  Psych: Normal affect. Extremities: No clubbing or cyanosis.  HEENT: Normal.    Telemetry   NSR 50s-60s, personally reviewed  Labs    CBC Recent Labs    04/15/19 0545 04/16/19 0533  WBC 5.3 5.4  HGB 9.5* 9.6*  HCT 28.1* 28.4*  MCV 93.4 94.7  PLT 140* 606*   Basic Metabolic Panel Recent Labs    04/14/19 1635 04/15/19 0545 04/16/19 0533  NA 132* 134* 132*  K 5.0 4.6 4.8  CL 93* 95* 91*  CO2 28 28 27   GLUCOSE 211*  160* 168*  BUN 15 24* 38*  CREATININE 4.74* 6.38* 8.95*  CALCIUM 8.8* 8.8* 9.3  PHOS 3.1  --   --    Liver Function Tests Recent Labs    04/14/19 1635  ALBUMIN 3.6   No results for input(s): LIPASE, AMYLASE in the last 72 hours. Cardiac Enzymes No results for input(s): CKTOTAL, CKMB, CKMBINDEX, TROPONINI in the last 72 hours.  BNP: BNP (last 3 results) Recent Labs    05/04/18 1515  BNP 1,423.3*    ProBNP (last 3 results) No results for input(s): PROBNP in the last 8760 hours.   D-Dimer No results for input(s): DDIMER in the last 72 hours. Hemoglobin A1C Recent Labs    04/14/19 1314  HGBA1C 7.0*   Fasting Lipid Panel No results for input(s): CHOL, HDL, LDLCALC, TRIG, CHOLHDL, LDLDIRECT in the last 72 hours. Thyroid Function Tests No results for input(s): TSH, T4TOTAL, T3FREE, THYROIDAB in the last 72 hours.  Invalid input(s): FREET3  Other results:   Imaging    No results found.   Medications:     Scheduled Medications: . amLODipine  10 mg Oral Daily  . aspirin  81 mg Oral Daily  . atorvastatin  80 mg Oral Daily  . calcium acetate  667 mg Oral TID  WC  . Chlorhexidine Gluconate Cloth  6 each Topical Q0600  . cinacalcet  30 mg Oral Q M,W,F-HD  . citalopram  20 mg Oral Daily  . clopidogrel  75 mg Oral Daily  . doxercalciferol  2 mcg Intravenous Q M,W,F-HD  . ezetimibe  10 mg Oral Daily  . insulin aspart  0-9 Units Subcutaneous TID WC  . isosorbide mononitrate  90 mg Oral Daily  . losartan  100 mg Oral Daily  . metoprolol succinate  12.5 mg Oral QHS  . pantoprazole  40 mg Oral Daily    Infusions: . sodium chloride    . heparin 950 Units/hr (04/16/19 0641)    PRN Medications: sodium chloride, acetaminophen, ondansetron (ZOFRAN) IV    Assessment/Plan   1. CAD: s/p CABG.  NSTEMI 10/18, cath with 95% ostial SVG-OM1 stenosis with TIMI 2 flow in graft, s/p DES to SVG-OM1 07/07/17. NSTEMI 7/19 with in-stent restenosis in the SVG-OM1, had DES x  2 to native OM1.   He has had chronic atypical chest pain episodes, Cardiolite in 3/20 showed no ischemia.  He had an episode of severe CP associated with hypoglycemia about a week ago, and since then, he has had on and off episodes of more mild chest pain with no trigger.  ECG is unchanged.  I admitted him on 7/30 due to mildly elevated troponin.  There was no trend, so possibly this is related to ESRD, but with ongoing episodes of chest pain, I think he will need cardiac cath.  - He has been on Eliquis and has ESRD, ideally would hold Eliquis 3 days prior to cath. Will plan for procedure Monday morning at 7:30 am after holding Eliquis. We have discussed risks/benefits and patient agreed to procedure.    - Will cover with heparin gtt while off Eliquis and will give ASA 81 for now.  - Continue Plavix for now, has been 1 year since last PCI so if cath does not show progressive disease in need of PCI, should be able to stop Plavix at this point.     - Increased Imdur to 90 mg daily.  - Continue atorvastatin 80 mg daily.  2. Hyperlipidemia: Continue atorvastatin and Zetia.  Good LDL in 2/20.      3. Chronic diastolic CHF:  Ischemic cardiomyopathy.  EF low in past but improved after CABG. Echo this admission with EF 50-55%, inferolateral hypokinesis (similar to prior).  - On dialysis for volume control.  - Continue Toprol XL 25 mg at bed time as well as losartan.   4. Atrial fibrillation: Paroxysmal, NSR today.  He will need to hold Eliquis, ideally x 3 days, prior to cath. Can restart afterwards.  5. HTN: BP elevated.  Continue amlodipine and losartan for now.  He may need a lower dry weight with HD.  6. ESRD: MWF.  7. Carotid bruit: Mild carotid disease on dopplers in 1/20. 8. PAD: Normal ABIs in 4/20.    Length of Stay: 3  Loralie Champagne, MD  04/16/2019, 8:08 AM  Advanced Heart Failure Team Pager [phone] (M-F; 7a - 4p)  Please contact [facility] for night-coverage after hours (4p -7a ) and  weekends on [email or web address]

## 2019-04-16 NOTE — Progress Notes (Signed)
Patient ID: Kenneth [name], male   DOB: 1956/09/14, 63 y.o.   MRN: [id number]     Advanced Heart Failure Rounding Note  PCP-Cardiologist: Loralie Champagne, MD   Subjective:    No further chest pain this morning.    Had HD Friday   SBP consistently elevated, 150s-170s.   TnI 166 => 153 => 139  ECG showed lateral TWIs, not markedly different than prior.   Echo: EF 50-55%, basal inferolateral HK, normal RV.    Objective:   Weight Range: 65.6 kg Body mass index is 22.65 kg/m.   Vital Signs:   Temp:  [98.4 F (36.9 C)-98.6 F (37 C)] 98.4 F (36.9 C) (08/02 0403) Pulse Rate:  [55-61] 61 (08/02 0403) Resp:  [15-18] 18 (08/02 0403) BP: (144-163)/(59-77) 149/66 (08/02 0403) SpO2:  [94 %-100 %] 98 % (08/02 0403) Weight:  [65.6 kg] 65.6 kg (08/02 0500) Last BM Date: 04/13/19  Weight change: Filed Weights   04/15/19 0519 04/16/19 0018 04/16/19 0500  Weight: 64.6 kg 65.6 kg 65.6 kg    Intake/Output:   Intake/Output Summary (Last 24 hours) at 04/16/2019 0808 Last data filed at 04/15/2019 2300 Gross per 24 hour  Intake 502.23 ml  Output 0 ml  Net 502.23 ml      Physical Exam    General: NAD Neck: No JVD, no thyromegaly or thyroid nodule.  Lungs: Clear to auscultation bilaterally with normal respiratory effort. CV: Nondisplaced PMI.  Heart regular S1/S2, no S3/S4, no murmur.  No peripheral edema.   Abdomen: Soft, nontender, no hepatosplenomegaly, no distention.  Skin: Intact without lesions or rashes.  Neurologic: Alert and oriented x 3.  Psych: Normal affect. Extremities: No clubbing or cyanosis.  HEENT: Normal.    Telemetry   NSR 50s-60s, personally reviewed  Labs    CBC Recent Labs    04/15/19 0545 04/16/19 0533  WBC 5.3 5.4  HGB 9.5* 9.6*  HCT 28.1* 28.4*  MCV 93.4 94.7  PLT 140* 390*   Basic Metabolic Panel Recent Labs    04/14/19 1635 04/15/19 0545 04/16/19 0533  NA 132* 134* 132*  K 5.0 4.6 4.8  CL 93* 95* 91*  CO2 28 28 27   GLUCOSE 211*  160* 168*  BUN 15 24* 38*  CREATININE 4.74* 6.38* 8.95*  CALCIUM 8.8* 8.8* 9.3  PHOS 3.1  --   --    Liver Function Tests Recent Labs    04/14/19 1635  ALBUMIN 3.6   No results for input(s): LIPASE, AMYLASE in the last 72 hours. Cardiac Enzymes No results for input(s): CKTOTAL, CKMB, CKMBINDEX, TROPONINI in the last 72 hours.  BNP: BNP (last 3 results) Recent Labs    05/04/18 1515  BNP 1,423.3*    ProBNP (last 3 results) No results for input(s): PROBNP in the last 8760 hours.   D-Dimer No results for input(s): DDIMER in the last 72 hours. Hemoglobin A1C Recent Labs    04/14/19 1314  HGBA1C 7.0*   Fasting Lipid Panel No results for input(s): CHOL, HDL, LDLCALC, TRIG, CHOLHDL, LDLDIRECT in the last 72 hours. Thyroid Function Tests No results for input(s): TSH, T4TOTAL, T3FREE, THYROIDAB in the last 72 hours.  Invalid input(s): FREET3  Other results:   Imaging    No results found.   Medications:     Scheduled Medications: . amLODipine  10 mg Oral Daily  . aspirin  81 mg Oral Daily  . atorvastatin  80 mg Oral Daily  . calcium acetate  667 mg Oral TID  WC  . Chlorhexidine Gluconate Cloth  6 each Topical Q0600  . cinacalcet  30 mg Oral Q M,W,F-HD  . citalopram  20 mg Oral Daily  . clopidogrel  75 mg Oral Daily  . doxercalciferol  2 mcg Intravenous Q M,W,F-HD  . ezetimibe  10 mg Oral Daily  . insulin aspart  0-9 Units Subcutaneous TID WC  . isosorbide mononitrate  90 mg Oral Daily  . losartan  100 mg Oral Daily  . metoprolol succinate  12.5 mg Oral QHS  . pantoprazole  40 mg Oral Daily    Infusions: . sodium chloride    . heparin 950 Units/hr (04/16/19 0641)    PRN Medications: sodium chloride, acetaminophen, ondansetron (ZOFRAN) IV    Assessment/Plan   1. CAD: s/p CABG.  NSTEMI 10/18, cath with 95% ostial SVG-OM1 stenosis with TIMI 2 flow in graft, s/p DES to SVG-OM1 07/07/17. NSTEMI 7/19 with in-stent restenosis in the SVG-OM1, had DES x  2 to native OM1.   He has had chronic atypical chest pain episodes, Cardiolite in 3/20 showed no ischemia.  He had an episode of severe CP associated with hypoglycemia about a week ago, and since then, he has had on and off episodes of more mild chest pain with no trigger.  ECG is unchanged.  I admitted him on 7/30 due to mildly elevated troponin.  There was no trend, so possibly this is related to ESRD, but with ongoing episodes of chest pain, I think he will need cardiac cath.  - He has been on Eliquis and has ESRD, ideally would hold Eliquis 3 days prior to cath. Will plan for procedure Monday morning at 7:30 am after holding Eliquis. We have discussed risks/benefits and patient agreed to procedure.    - Will cover with heparin gtt while off Eliquis and will give ASA 81 for now.  - Continue Plavix for now, has been 1 year since last PCI so if cath does not show progressive disease in need of PCI, should be able to stop Plavix at this point.     - Increased Imdur to 90 mg daily.  - Continue atorvastatin 80 mg daily.  2. Hyperlipidemia: Continue atorvastatin and Zetia.  Good LDL in 2/20.      3. Chronic diastolic CHF:  Ischemic cardiomyopathy.  EF low in past but improved after CABG. Echo this admission with EF 50-55%, inferolateral hypokinesis (similar to prior).  - On dialysis for volume control.  - Continue Toprol XL 25 mg at bed time as well as losartan.   4. Atrial fibrillation: Paroxysmal, NSR today.  He will need to hold Eliquis, ideally x 3 days, prior to cath. Can restart afterwards.  5. HTN: BP elevated.  Continue amlodipine and losartan for now.  He may need a lower dry weight with HD.  6. ESRD: MWF.  7. Carotid bruit: Mild carotid disease on dopplers in 1/20. 8. PAD: Normal ABIs in 4/20.    Length of Stay: 3  Loralie Champagne, MD  04/16/2019, 8:08 AM  Advanced Heart Failure Team Pager [phone] (M-F; 7a - 4p)  Please contact [facility] for night-coverage after hours (4p -7a ) and  weekends on [email or web address]

## 2019-04-16 NOTE — Progress Notes (Addendum)
Joanna for heparin Indication: atrial fibrillation  No Known Allergies  Patient Measurements: Weight: 144 lb 10 oz (65.6 kg)   Vital Signs: Temp: 98.4 F (36.9 C) (08/02 0403) Temp Source: Oral (08/02 0403) BP: 149/66 (08/02 0403) Pulse Rate: 61 (08/02 0403)  Labs: Recent Labs    04/13/19 1630 04/13/19 1853 04/13/19 2106 04/13/19 2255  04/14/19 1147  04/14/19 1314 04/14/19 1635 04/15/19 0545 04/16/19 0533  HGB  --   --   --   --   --   --    < > 10.0*  --  9.5* 9.6*  HCT  --   --   --   --   --   --   --  30.0*  --  28.1* 28.4*  PLT  --   --   --   --   --   --   --  127*  --  140* 135*  APTT 41*  --   --   --   --  68*  --   --   --  76* 81*  HEPARINUNFRC >2.20*  --   --   --   --   --   --   --   --  >2.20* 1.24*  CREATININE  --   --   --   --    < >  --   --   --  4.74* 6.38* 8.95*  TROPONINIHS  --  166* 153* 139*  --   --   --   --   --   --   --    < > = values in this interval not displayed.    Estimated Creatinine Clearance: 8 mL/min (A) (by C-G formula based on SCr of 8.95 mg/dL (H)).  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 Admitted due to elevated troponin,166. Planning cardiac cath on Monday.    Patient taking Eliquis PTA, last dose taken 7/30 @1700  (per patient). Now transitioned to IV heparin. APTT remains therapeutic at 81. Heparin level remains falsely elevated since the patient was taking Eliquis PTA. Will continue to rely on aPTT levels for dosing until the heparin level is no longer falsely elevated.  H&H low but stable at 9.6/28.4; platelets stable at 135. Pt reported bleeding with HD on 7/31. No issues at present but will continue to monitor.  Goal of Therapy:  Heparin level 0.3-0.7 units/ml aPTT 66-102 seconds  Monitor platelets by  anticoagulation protocol: Yes   Plan:  Continue heparin drip at 950 units/hr Daily heparin level, aPTT and CBC Monitor for s/sx of bleeding F/u plans for anticoagulation post cath on 8/3  Kennon Holter, PharmD PGY1 Amb[facility] Pharmacy Resident [employer] Phone: [phone] 04/16/2019 7:32 AM

## 2019-04-16 NOTE — Progress Notes (Addendum)
Seen and examined independently.  Agree with note and exam as documented above by physician extender.  ESRD on HD with CAD and chest pain - for cath on 8/3.  He states has had prolonged bleeding from his fistula.  Interventional nephrology has assessed the patient here and recommends fistulagram with CKV.  Would benefit from vascular surgery evaluation outpatient as well.  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  LUE AVF with bruit and thrill.   Claudia Desanctis, MD 04/16/2019   12:40 PM     [facility] Progress Note   Dialysis Orders: Center:[facility] MWF. Time: 3h 41min, 180NRe, 2K/2.25Ca, EDW 65.5kg, LUE AVF 400/800 Heparin: 2400 unit bolus Sensipar 30 mg TIW  Hectorol 2 mcg TIW with dialysis Mircera 75 mcg q 2 weeks (last dose 04/12/2019) hgb 10.1 7/29   Assessment/Plan: 1. Chest pain hx CABG, and prior NSTEMIs; plan is cath on Monday after holding eliquis for 3 days. On heparin drip/plavix 2. ESRD:MWF K 4.6 - plan HD Monday work around heart cath. 3. Hypertension/volume:Hx chronic dCHF- EF 50 - 55% Net UF 2.5 Friday with post wt 64.4 about a kg below outpatient wt - only one SBP drop on HD yesterday - wonder if it was real since all others 140 - 160s.- suspect we can go down further on EDW (outpatient records indicated minimal gains Currently on amlodipine 10, losartan 100, MTP XL 12.5 - imdur 90  4. Anemia:Last hemoglobin 9.6. No bleeding reported. Not due for ESA yet. No CBC results yet this AM. 5. Metabolic bone disease: Continue outpatient sensipar and hectorol. Continue calcium acetate 1 tab TID.  6. Nutrition:Recommend renal diet with fluid restrictions. 7. A. Fib:  per cardiology 8. Access bleeding issues - has had prolonged bleeding at outpatient unit and here -no bleeding issues at present.- on heparin drip; last AF 1569 stable 7/17 - had intervention mid 01/26/19 at [facility] by Dr. Augustin Coupe  - had 90% arch stenosis that  required a stent nad 70% ouflow ceph stenosis- assessed by Dr. Cira Servant who recommends fistulagram after d/c at [facility] - this will need to be arranged at d/c by dialysis unit.   Myriam Jacobson, PA-C [place] 04/16/2019,9:17 AM  LOS: 3 days   Subjective:   No c/o. Reviewed plan for access evaluation after d/c. Objective Vitals:   04/16/19 0018 04/16/19 0403 04/16/19 0500 04/16/19 0851  BP:  (!) 149/66  (!) 130/58  Pulse:  61  66  Resp:  18    Temp:  98.4 F (36.9 C)    TempSrc:  Oral    SpO2:  98%    Weight: 65.6 kg  65.6 kg    Physical Exam General: NAD sitting in chair watching video Heart: RRR Lungs: few crackles grossly clear Abdomen: soft NT Extremities: no LE edema Dialysis Access: left upper AVF no bleeding + bruit somewhat pulsatile   Additional Objective Labs: Basic Metabolic Panel: Recent Labs  Lab 04/14/19 1635 04/15/19 0545 04/16/19 0533  NA 132* 134* 132*  K 5.0 4.6 4.8  CL 93* 95* 91*  CO2 28 28 27   GLUCOSE 211* 160* 168*  BUN 15 24* 38*  CREATININE 4.74* 6.38* 8.95*  CALCIUM 8.8* 8.8* 9.3  PHOS 3.1  --   --    Liver Function Tests: Recent Labs  Lab 04/14/19 1635  ALBUMIN 3.6   No results for input(s): LIPASE, AMYLASE in the last 168 hours. CBC:  Recent Labs  Lab 04/14/19 1314 04/15/19 0545 04/16/19 0533  WBC 5.0 5.3 5.4  HGB 10.0* 9.5* 9.6*  HCT 30.0* 28.1* 28.4*  MCV 94.6 93.4 94.7  PLT 127* 140* 135*   Blood Culture    Component Value Date/Time   SDES  10/13/2017 1552    TOE LEFT FOURTH Performed at [facility], [place] [address] [place], [place] [address]    [facility]  10/13/2017 1552    NONE Performed at [facility], [address] [place], [place] [address]    CULT  10/13/2017 1552    ABUNDANT METHICILLIN RESISTANT STAPHYLOCOCCUS AUREUS   REPTSTATUS 10/16/2017 FINAL 10/13/2017 1552    Cardiac Enzymes: No results for input(s): CKTOTAL, CKMB, CKMBINDEX, TROPONINI in  the last 168 hours. CBG: Recent Labs  Lab 04/15/19 0610 04/15/19 1207 04/15/19 1733 04/15/19 2119 04/16/19 0634  GLUCAP 151* 145* 126* 159* 143*   Iron Studies: No results for input(s): IRON, TIBC, TRANSFERRIN, FERRITIN in the last 72 hours. Lab Results  Component Value Date   INR 0.95 07/07/2017   INR 1.24 11/03/2016   INR 1.10 06/12/2016   Studies/Results: No results found. Medications: . sodium chloride    . heparin 950 Units/hr (04/16/19 0641)   . amLODipine  10 mg Oral Daily  . aspirin  81 mg Oral Daily  . atorvastatin  80 mg Oral Daily  . calcium acetate  667 mg Oral TID WC  . Chlorhexidine Gluconate Cloth  6 each Topical Q0600  . cinacalcet  30 mg Oral Q M,W,F-HD  . citalopram  20 mg Oral Daily  . clopidogrel  75 mg Oral Daily  . doxercalciferol  2 mcg Intravenous Q M,W,F-HD  . ezetimibe  10 mg Oral Daily  . insulin aspart  0-9 Units Subcutaneous TID WC  . isosorbide mononitrate  90 mg Oral Daily  . losartan  100 mg Oral Daily  . metoprolol succinate  12.5 mg Oral QHS  . pantoprazole  40 mg Oral Daily  . sodium chloride flush  3 mL Intravenous Q12H

## 2019-04-17 ENCOUNTER — Encounter (HOSPITAL_COMMUNITY): Payer: Self-pay | Admitting: Cardiology

## 2019-04-17 ENCOUNTER — Encounter (HOSPITAL_COMMUNITY)
Admission: AD | Disposition: A | Discharge status: Home / Self Care | Payer: Medicare Other | Source: Ambulatory Visit | Attending: Cardiology

## 2019-04-17 ENCOUNTER — Ambulatory Visit (HOSPITAL_COMMUNITY): Admission: RE | Admit: 2019-04-17 | Payer: Medicare Other | Source: Home / Self Care | Admitting: Cardiology

## 2019-04-17 DIAGNOSIS — I2510 Atherosclerotic heart disease of native coronary artery without angina pectoris: Secondary | ICD-10-CM

## 2019-04-17 DIAGNOSIS — I10 Essential (primary) hypertension: Secondary | ICD-10-CM

## 2019-04-17 DIAGNOSIS — I480 Paroxysmal atrial fibrillation: Secondary | ICD-10-CM

## 2019-04-17 HISTORY — PX: LEFT HEART CATH AND CORS/GRAFTS ANGIOGRAPHY: CATH118250

## 2019-04-17 LAB — BASIC METABOLIC PANEL
Anion gap: 16 — ABNORMAL HIGH (ref 5–15)
BUN: 50 mg/dL — ABNORMAL HIGH (ref 8–23)
CO2: 24 mmol/L (ref 22–32)
Calcium: 9.5 mg/dL (ref 8.9–10.3)
Chloride: 90 mmol/L — ABNORMAL LOW (ref 98–111)
Creatinine, Ser: 11.7 mg/dL — ABNORMAL HIGH (ref 0.61–1.24)
GFR calc Af Amer: 5 mL/min — ABNORMAL LOW (ref >60)
GFR calc non Af Amer: 4 mL/min — ABNORMAL LOW (ref >60)
Glucose, Bld: 152 mg/dL — ABNORMAL HIGH (ref 70–99)
Potassium: 4.8 mmol/L (ref 3.5–5.1)
Sodium: 130 mmol/L — ABNORMAL LOW (ref 135–145)

## 2019-04-17 LAB — APTT: aPTT: 87 seconds — ABNORMAL HIGH (ref 24–36)

## 2019-04-17 LAB — HEPARIN LEVEL (UNFRACTIONATED): Heparin Unfractionated: 0.86 IU/mL — ABNORMAL HIGH (ref 0.30–0.70)

## 2019-04-17 LAB — GLUCOSE, CAPILLARY
Glucose-Capillary: 143 mg/dL — ABNORMAL HIGH (ref 70–99)
Glucose-Capillary: 134 mg/dL — ABNORMAL HIGH (ref 70–99)

## 2019-04-17 LAB — CBC
HCT: 28.4 % — ABNORMAL LOW (ref 39.0–52.0)
Hemoglobin: 9.8 g/dL — ABNORMAL LOW (ref 13.0–17.0)
MCH: 32.2 pg (ref 26.0–34.0)
MCHC: 34.5 g/dL (ref 30.0–36.0)
MCV: 93.4 fL (ref 80.0–100.0)
Platelets: 133 10*3/uL — ABNORMAL LOW (ref 150–400)
RBC: 3.04 MIL/uL — ABNORMAL LOW (ref 4.22–5.81)
RDW: 16 % — ABNORMAL HIGH (ref 11.5–15.5)
WBC: 6.2 10*3/uL (ref 4.0–10.5)
nRBC: 0 % (ref 0.0–0.2)

## 2019-04-17 SURGERY — LEFT HEART CATH AND CORS/GRAFTS ANGIOGRAPHY
Anesthesia: LOCAL

## 2019-04-17 MED ORDER — SODIUM CHLORIDE 0.9 % IV SOLN: 250.0000 mL | INTRAVENOUS | Status: DC | PRN

## 2019-04-17 MED ORDER — LABETALOL HCL 5 MG/ML IV SOLN: 10.0000 mg | INTRAVENOUS | Status: AC | PRN

## 2019-04-17 MED ORDER — HEPARIN (PORCINE) IN NACL 1000-0.9 UT/500ML-% IV SOLN
INTRAVENOUS | Status: AC
  Filled 2019-04-17: qty 1000

## 2019-04-17 MED ORDER — FENTANYL CITRATE (PF) 100 MCG/2ML IJ SOLN
INTRAMUSCULAR | Status: DC | PRN
  Administered 2019-04-17: 25 ug via INTRAVENOUS

## 2019-04-17 MED ORDER — APIXABAN 5 MG PO TABS: 5.0000 mg | ORAL_TABLET | Freq: Two times a day (BID) | ORAL | Status: DC

## 2019-04-17 MED ORDER — FENTANYL CITRATE (PF) 100 MCG/2ML IJ SOLN
INTRAMUSCULAR | Status: AC
  Filled 2019-04-17: qty 2

## 2019-04-17 MED ORDER — HYDRALAZINE HCL 20 MG/ML IJ SOLN
INTRAMUSCULAR | Status: DC | PRN
  Administered 2019-04-17: 10 mg via INTRAVENOUS

## 2019-04-17 MED ORDER — ONDANSETRON HCL 4 MG/2ML IJ SOLN: 4.0000 mg | Freq: Four times a day (QID) | INTRAMUSCULAR | Status: DC | PRN

## 2019-04-17 MED ORDER — SODIUM CHLORIDE 0.9% FLUSH: 3.0000 mL | INTRAVENOUS | Status: DC | PRN

## 2019-04-17 MED ORDER — DOXERCALCIFEROL 4 MCG/2ML IV SOLN
INTRAVENOUS | Status: AC
  Administered 2019-04-17: 2 ug via INTRAVENOUS
  Filled 2019-04-17: qty 2

## 2019-04-17 MED ORDER — APIXABAN 5 MG PO TABS: 5.0000 mg | ORAL_TABLET | Freq: Two times a day (BID) | ORAL | 1 refills | Status: DC

## 2019-04-17 MED ORDER — HEPARIN (PORCINE) IN NACL 1000-0.9 UT/500ML-% IV SOLN
INTRAVENOUS | Status: DC | PRN
  Administered 2019-04-17 (×2): 500 mL

## 2019-04-17 MED ORDER — HYDRALAZINE HCL 25 MG PO TABS: 25.0000 mg | ORAL_TABLET | Freq: Three times a day (TID) | ORAL | 6 refills | Status: DC

## 2019-04-17 MED ORDER — HYDRALAZINE HCL 25 MG PO TABS
25.0000 mg | ORAL_TABLET | Freq: Three times a day (TID) | ORAL | Status: DC
  Administered 2019-04-17 (×2): 25 mg via ORAL
  Filled 2019-04-17 (×2): qty 1

## 2019-04-17 MED ORDER — LIDOCAINE HCL (PF) 1 % IJ SOLN
INTRAMUSCULAR | Status: AC
  Filled 2019-04-17: qty 30

## 2019-04-17 MED ORDER — SODIUM CHLORIDE 0.9% FLUSH: 3.0000 mL | Freq: Two times a day (BID) | INTRAVENOUS | Status: DC

## 2019-04-17 MED ORDER — ACETAMINOPHEN 325 MG PO TABS: 650.0000 mg | ORAL_TABLET | ORAL | Status: DC | PRN

## 2019-04-17 MED ORDER — HYDRALAZINE HCL 20 MG/ML IJ SOLN: 10.0000 mg | INTRAMUSCULAR | Status: AC | PRN

## 2019-04-17 MED ORDER — LIDOCAINE HCL (PF) 1 % IJ SOLN
INTRAMUSCULAR | Status: DC | PRN
  Administered 2019-04-17: 10 mL

## 2019-04-17 MED ORDER — IOHEXOL 350 MG/ML SOLN
INTRAVENOUS | Status: DC | PRN
  Administered 2019-04-17: 117 mL via INTRA_ARTERIAL

## 2019-04-17 MED ORDER — MIDAZOLAM HCL 2 MG/2ML IJ SOLN
INTRAMUSCULAR | Status: DC | PRN
  Administered 2019-04-17: 1 mg via INTRAVENOUS

## 2019-04-17 MED ORDER — MIDAZOLAM HCL 2 MG/2ML IJ SOLN
INTRAMUSCULAR | Status: AC
  Filled 2019-04-17: qty 2

## 2019-04-17 SURGICAL SUPPLY — 11 items
CATH INFINITI 5 FR 3DRC (CATHETERS) ×1 IMPLANT
CATH INFINITI 5 FR IM (CATHETERS) ×1 IMPLANT
CATH INFINITI 5FR MULTPACK ANG (CATHETERS) ×1 IMPLANT
CLOSURE MYNX CONTROL 5F (Vascular Products) ×1 IMPLANT
KIT HEART LEFT (KITS) ×2 IMPLANT
PACK CARDIAC CATHETERIZATION (CUSTOM PROCEDURE TRAY) ×2 IMPLANT
SHEATH PINNACLE 5F 10CM (SHEATH) ×1 IMPLANT
TRANSDUCER W/STOPCOCK (MISCELLANEOUS) ×2 IMPLANT
TUBING CIL FLEX 10 FLL-RA (TUBING) ×2 IMPLANT
WIRE EMERALD 3MM-J .035X150CM (WIRE) ×1 IMPLANT
WIRE EMERALD 3MM-J .035X260CM (WIRE) ×1 IMPLANT

## 2019-04-17 NOTE — Progress Notes (Signed)
Discharge information and medication education given. Pt refused interpreter and verbalized understanding. Questions and concerns answered. Pt refused to be transported in wheelchair and stated that wanted to walk. Peripheral iv removed, clean dry and intact, pressure and dressing applied. Education on femoral area care given with teach back.  Pt's belongings with patient: clothes and shoes, cell phone and ipad.

## 2019-04-17 NOTE — Progress Notes (Signed)
Patient ID: Kenneth [name], male   DOB: 1956-03-25, 63 y.o.   MRN: [id number]     Advanced Heart Failure Rounding Note  PCP-Cardiologist: Loralie Champagne, MD   Subjective:    No further chest pain this morning.    Had HD Friday   SBP consistently elevated, 150s-170s.   TnI 166 => 153 => 139  ECG showed lateral TWIs, not markedly different than prior.   Echo: EF 50-55%, basal inferolateral HK, normal RV.   LHC (8/3):  Coronary Findings  Diagnostic Dominance: Right Left Anterior Descending  90% proximal LAD stenosis, subtotal occlusion of the mid LAD. LIMA-LAD is patent.  Ramus Intermedius  Large ramus with long stented segment in the proximal to mid vessel. 40-50% in-stent restenosis in the distal stented segment. Totally occluded SVG-ramus.  Left Circumflex  The AV LCx is small in caliber, 80% ostial stenosis.  Right Coronary Artery  Occluded proximal RCA. The SVG-RCA is known to be occluded from prior caths and was not injected. There were extensive left to right collaterals.      Objective:   Weight Range: 67 kg Body mass index is 23.12 kg/m.   Vital Signs:   Temp:  [98 F (36.7 C)-98.7 F (37.1 C)] 98.5 F (36.9 C) (08/03 0623) Pulse Rate:  [55-68] 55 (08/03 0822) Resp:  [11-20] 20 (08/03 0822) BP: (130-180)/(53-80) 162/73 (08/03 0822) SpO2:  [95 %-100 %] 100 % (08/03 0822) Weight:  [67 kg] 67 kg (08/03 0623) Last BM Date: 04/16/19  Weight change: Filed Weights   04/16/19 0018 04/16/19 0500 04/17/19 0623  Weight: 65.6 kg 65.6 kg 67 kg    Intake/Output:   Intake/Output Summary (Last 24 hours) at 04/17/2019 0845 Last data filed at 04/17/2019 0629 Gross per 24 hour  Intake 865.84 ml  Output 1 ml  Net 864.84 ml      Physical Exam    General: NAD Neck: JVP 8 cm, no thyromegaly or thyroid nodule.  Lungs: Clear to auscultation bilaterally with normal respiratory effort. CV: Nondisplaced PMI.  Heart regular S1/S2, no S3/S4, no murmur.  No peripheral  edema.   Abdomen: Soft, nontender, no hepatosplenomegaly, no distention.  Skin: Intact without lesions or rashes.  Neurologic: Alert and oriented x 3.  Psych: Normal affect. Extremities: No clubbing or cyanosis.  HEENT: Normal.    Telemetry   NSR 50s-60s, personally reviewed  Labs    CBC Recent Labs    04/16/19 0533 04/17/19 0645  WBC 5.4 6.2  HGB 9.6* 9.8*  HCT 28.4* 28.4*  MCV 94.7 93.4  PLT 135* 315*   Basic Metabolic Panel Recent Labs    04/14/19 1635  04/16/19 0533 04/17/19 0645  NA 132*   < > 132* 130*  K 5.0   < > 4.8 4.8  CL 93*   < > 91* 90*  CO2 28   < > 27 24  GLUCOSE 211*   < > 168* 152*  BUN 15   < > 38* 50*  CREATININE 4.74*   < > 8.95* 11.70*  CALCIUM 8.8*   < > 9.3 9.5  PHOS 3.1  --   --   --    < > = values in this interval not displayed.   Liver Function Tests Recent Labs    04/14/19 1635  ALBUMIN 3.6   No results for input(s): LIPASE, AMYLASE in the last 72 hours. Cardiac Enzymes No results for input(s): CKTOTAL, CKMB, CKMBINDEX, TROPONINI in the last 72 hours.  BNP: BNP (  last 3 results) Recent Labs    05/04/18 1515  BNP 1,423.3*    ProBNP (last 3 results) No results for input(s): PROBNP in the last 8760 hours.   D-Dimer No results for input(s): DDIMER in the last 72 hours. Hemoglobin A1C Recent Labs    04/14/19 1314  HGBA1C 7.0*   Fasting Lipid Panel No results for input(s): CHOL, HDL, LDLCALC, TRIG, CHOLHDL, LDLDIRECT in the last 72 hours. Thyroid Function Tests No results for input(s): TSH, T4TOTAL, T3FREE, THYROIDAB in the last 72 hours.  Invalid input(s): FREET3  Other results:   Imaging    No results found.   Medications:     Scheduled Medications: . amLODipine  10 mg Oral Daily  . aspirin  81 mg Oral Daily  . atorvastatin  80 mg Oral Daily  . calcium acetate  667 mg Oral TID WC  . Chlorhexidine Gluconate Cloth  6 each Topical Q0600  . cinacalcet  30 mg Oral Q M,W,F-HD  . citalopram  20 mg Oral  Daily  . clopidogrel  75 mg Oral Daily  . doxercalciferol  2 mcg Intravenous Q M,W,F-HD  . ezetimibe  10 mg Oral Daily  . insulin aspart  0-9 Units Subcutaneous TID WC  . isosorbide mononitrate  90 mg Oral Daily  . losartan  100 mg Oral Daily  . metoprolol succinate  12.5 mg Oral QHS  . pantoprazole  40 mg Oral Daily  . sodium chloride flush  3 mL Intravenous Q12H  . sodium chloride flush  3 mL Intravenous Q12H    Infusions: . sodium chloride    . heparin Stopped (04/17/19 0649)    PRN Medications: sodium chloride, acetaminophen, ondansetron (ZOFRAN) IV    Assessment/Plan   1. CAD: s/p CABG.  NSTEMI 10/18, cath with 95% ostial SVG-OM1 stenosis with TIMI 2 flow in graft, s/p DES to SVG-OM1 07/07/17. NSTEMI 7/19 with in-stent restenosis in the SVG-OM1, had DES x 2 to native ramus.   He has had chronic atypical chest pain episodes, Cardiolite in 3/20 showed no ischemia.  He had an episode of severe CP associated with hypoglycemia about a week prior to admission, and since then, he has had on and off episodes of more mild chest pain with no trigger.  ECG is unchanged.  I admitted him on 7/30 due to mildly elevated troponin.  There was no trend, so suspect this was related to ESRD. Cath today showed no interventional target, stents in ramus patent with only 40-50% in-stent restenosis.  It is possible that his chest pain is angina, but will need to be treated medically.  Improved BP control may help with this.  - Restart his Eliquis tomorrow morning. At this point, it has been > 1 year since last PCI and he is on Eliquis.  Therefore, think he can stop Plavix.  - Increased Imdur to 90 mg daily.  - Continue atorvastatin 80 mg daily.  2. Hyperlipidemia: Continue atorvastatin and Zetia.  Good LDL in 2/20.      3. Chronic diastolic CHF:  Ischemic cardiomyopathy.  EF low in past but improved after CABG. Echo this admission with EF 50-55%, inferolateral hypokinesis (similar to prior).  - On  dialysis for volume control.  - Continue Toprol XL 25 mg at bed time as well as losartan.   4. Atrial fibrillation: Paroxysmal, NSR today.  Restart Eliquis tomorrow morning.  5. HTN: BP elevated, may be triggering angina.  Continue amlodipine and losartan for now.   - Lower  dry weight with HD may help.  - I will add hydralazine 25 mg tid.  6. ESRD: MWF. Will have HD today.  7. Carotid bruit: Mild carotid disease on dopplers in 1/20. 8. PAD: Normal ABIs in 4/20.   9. Disposition: He can go home today after HD.  Meds for home: Imdur 90 daily, Eliquis 5 mg bid starting tomorrow morning, atorvastatin 80 mg daily, amlodipine 10 mg daily, hydralazine 25 mg tid, Zetia 10 mg daily, atorvastatin 80 daily, losartan 100 daily.   Length of Stay: 4  Loralie Champagne, MD  04/17/2019, 8:45 AM  Advanced Heart Failure Team Pager [phone] (M-F; 7a - 4p)  Please contact [facility] for night-coverage after hours (4p -7a ) and weekends on [email or web address]

## 2019-04-17 NOTE — Discharge Summary (Signed)
Advanced Heart Failure Team  Discharge Summary   Patient ID: Kenneth [name] MRN: [id number], DOB/AGE: [birth year] 62 y.o. Admit date: 04/13/2019 D/C date:     04/17/2019   Primary Discharge Diagnoses:  1. CAD: s/p CABG. NSTEMI 10/18, cath with 95% ostial SVG-OM1 stenosis with TIMI 2 flow in graft, s/p DES to SVG-OM1 07/07/17. NSTEMI 7/19 with in-stent restenosis in the SVG-OM1, had DES x 2 to native ramus.   2. Hyperlipidemia: Continue atorvastatin and Zetia.Good LDL in 2/20. 3. Chronic diastolic CHF: Ischemic cardiomyopathy.  4. Atrial fibrillation: Paroxysmal 5. HTN: 6. ESRD: MWF.Will have HD today.  7. Carotid bruit: Mild carotid disease on dopplers in 1/20. 8. CBS:WHQPRF ABIs in 4/20.    Hospital Course:  Kenneth [name] is a 63 year old with history of type II DM, CKD Stage V on HD since 2018, CAD, s/p CABG, and chronic systolic heart failure.   Admitted from the HF clinic with chest pain. HS Troponin 166 with some EKG changes. He was admitted for cath. Placed on heparin drip. Nephrology consulted for ongoing HD. On 8/3 he underwent LHC that showed occluded SVG-RCA and SVG-ramus, patent LIMA-LAD, large ramus was patent with 40-50% mid ramus stenosis (in-stent restenosis), 80% ostial stenosis very small caliber AV LCx, and occluded RCA (known from prior) with collaterals. No target for intervention.  Imdur was increased to 90 mg per day and we will plan for aggressive medical management of angina. See below for detailed problem list.   He will continue to be followed closely in the HF clinic.    1. CAD: s/p CABG. NSTEMI 10/18, cath with 95% ostial SVG-OM1 stenosis with TIMI 2 flow in graft, s/p DES to SVG-OM1 07/07/17. NSTEMI 7/19 with in-stent restenosis in the SVG-OM1, had DES x 2 to native ramus. He has hadchronic atypicalchest pain episodes,Cardiolite in 3/20 showed no ischemia. He had an episode of severe CP associated with hypoglycemia about a week prior to admission, and  since then, he has had on and off episodes of more mild chest pain with no trigger.  ECG is unchanged. I admitted him on 7/30 due to mildly elevated troponin.  There was no trend, so suspect this was related to ESRD. Cath today showed no interventional target, stents in ramus patent with only 40-50% in-stent restenosis.  It is possible that his chest pain is angina, but will need to be treated medically.  Improved BP control may help with this.  -Restart his Eliquis  On 04/18/19. At this point, it has been > 1 year since last PCI and he is on Eliquis.  Therefore, think he can stop Plavix.  -Placed on higher dose of imdur 90 mg daily. - Continue atorvastatin 80 mg daily.  2. Hyperlipidemia: Continue atorvastatin and Zetia.Good LDL in 2/20. 3. Chronic diastolic CHF: Ischemic cardiomyopathy. EF low in past but improved after CABG. Echo this admission with EF 50-55%, inferolateral hypokinesis (similar to prior).  - On dialysis for volume control.  - Continue Toprol XL 25 mg at bed timeas well as losartan.  4. Atrial fibrillation: Paroxysmal, NSR today. Restart Eliquis tomorrow morning.  5. HTN:BP elevated, may be triggering angina.  Continue amlodipine and losartan for now.    - Lower dry weight with HD may help.  -He was placed on hydralazine 25 mg three times a day.   6. ESRD: Nephrology consulted. MWF-He had HD on the day discharge.  7. Carotid bruit: Mild carotid disease on dopplers in 1/20. 8. FMB:WGYKZL ABIs in 4/20.  Discharge Vitals: Blood pressure (!) 150/52, pulse (!) 56, temperature 98.5 F (36.9 C), temperature source Oral, resp. rate 20, weight 67 kg, SpO2 98 %.  Labs: Lab Results  Component Value Date   WBC 6.2 04/17/2019   HGB 9.8 (L) 04/17/2019   HCT 28.4 (L) 04/17/2019   MCV 93.4 04/17/2019   PLT 133 (L) 04/17/2019    Recent Labs  Lab 04/17/19 0645  NA 130*  K 4.8  CL 90*  CO2 24  BUN 50*  CREATININE 11.70*  CALCIUM 9.5  GLUCOSE 152*   Lab  Results  Component Value Date   CHOL 103 11/10/2018   HDL 28 (L) 11/10/2018   LDLCALC 59 11/10/2018   TRIG 81 11/10/2018   BNP (last 3 results) Recent Labs    05/04/18 1515  BNP 1,423.3*    ProBNP (last 3 results) No results for input(s): PROBNP in the last 8760 hours.   Diagnostic Studies/Procedures   LHC 04/17/2019  Left Anterior Descending  90% proximal LAD stenosis, subtotal occlusion of the mid LAD. LIMA-LAD is patent.  Ramus Intermedius  Large ramus with long stented segment in the proximal to mid vessel. 40-50% in-stent restenosis in the distal stented segment. Totally occluded SVG-ramus.  Left Circumflex  The AV LCx is small in caliber, 80% ostial stenosis.  Right Coronary Artery  Occluded proximal RCA. The SVG-RCA is known to be occluded from prior caths and was not injected. There were extensive left to right collaterals.     Discharge Medications   Allergies as of 04/17/2019   No Known Allergies     Medication List    STOP taking these medications   clopidogrel 75 MG tablet Commonly known as: PLAVIX     TAKE these medications   acetaminophen 500 MG tablet Commonly known as: TYLENOL Take 500-1,000 mg by mouth every 6 (six) hours as needed for mild pain or headache.   amLODipine 10 MG tablet Commonly known as: NORVASC Take 10 mg by mouth at bedtime.   apixaban 5 MG Tabs tablet Commonly known as: Eliquis Take 1 tablet (5 mg total) by mouth 2 (two) times daily. Start taking on: April 18, 2019   atorvastatin 80 MG tablet Commonly known as: LIPITOR Take 1 tablet (80 mg total) by mouth daily. What changed: when to take this   calcium acetate 667 MG capsule Commonly known as: PHOSLO Take 667 mg by mouth 3 (three) times daily with meals.   citalopram 20 MG tablet Commonly known as: CELEXA Take 20 mg by mouth daily with lunch.   ezetimibe 10 MG tablet Commonly known as: ZETIA Take 1 tablet by mouth once daily   gabapentin 100 MG  capsule Commonly known as: NEURONTIN Take 100 mg by mouth daily with lunch.   hydrALAZINE 25 MG tablet Commonly known as: APRESOLINE Take 1 tablet (25 mg total) by mouth every 8 (eight) hours.   insulin pump Soln Inject into the skin continuous. NOVOLOG   isosorbide mononitrate 60 MG 24 hr tablet Commonly known as: IMDUR Take 1.5 tablets (90 mg total) by mouth daily. What changed: how much to take   losartan 100 MG tablet Commonly known as: COZAAR Take 100 mg by mouth at bedtime.   metoprolol succinate 25 MG 24 hr tablet Commonly known as: TOPROL-XL Take 1 tablet (25 mg total) by mouth at bedtime. What changed: how much to take   NovoLOG 100 UNIT/ML injection Generic drug: insulin aspart See admin instructions. "MEDTRONIC 630 G PUMP: BASAL 12A  0.7 6A 0.85 10P 0.7 BOLUS 12A 12 11A 10. ISF 60. INJECT TOTAL OF 60 UNITS SUBCUTANEOUSLY DAILY"   ofloxacin 0.3 % ophthalmic solution Commonly known as: OCUFLOX Place 1 drop into the right eye See admin instructions. Instill 1 drop into the right eye four times a day for 5 days following monthly eye injection   pantoprazole 40 MG tablet Commonly known as: PROTONIX Take 40 mg by mouth daily before lunch.   vitamin C 1000 MG tablet Take 1,000 mg by mouth daily.       Disposition   The patient will be discharged in stable condition to home. Discharge Instructions    Diet - low sodium heart healthy   Complete by: As directed    Increase activity slowly   Complete by: As directed      Follow-up Information    Larey Dresser, MD Follow up on 04/27/2019.   Specialty: Cardiology Why: at [place] information: [address] [place] [address] [phone]        [name], MD. Go on 04/25/2019.   Specialty: Family Medicine Why: @ 12:45 pm Contact information: [place] [address] [phone]             Duration of Discharge  Encounter: Greater than 35 minutes   Signed, Darrick Grinder NP-C  04/17/2019, 2:33 PM

## 2019-04-17 NOTE — Care Management Important Message (Signed)
Important Message  Patient Details  Name: Kenneth [name] MRN: [id number] Date of Birth: 03-05-1956   Medicare Important Message Given:  Yes     [name] 04/17/2019, 12:18 PM

## 2019-04-17 NOTE — Interval H&P Note (Signed)
History and Physical Interval Note:  04/17/2019 7:43 AM  Kenneth [name]  has presented today for surgery, with the diagnosis of chest pain.  The various methods of treatment have been discussed with the patient and family. After consideration of risks, benefits and other options for treatment, the patient has consented to  Procedure(s): LEFT HEART CATH AND CORS/GRAFTS ANGIOGRAPHY (N/A) as a surgical intervention.  [name] history has been reviewed, patient examined, no change in status, stable for surgery.  I have reviewed the patient's chart and labs.  Questions were answered to the patient's satisfaction.     Inette Doubrava [employer]

## 2019-04-17 NOTE — Progress Notes (Signed)
[facility] Progress Note   Dialysis: [facility] MWF  3h 57min  2K/2.25 bath   65.5kg  LUE AVF  400/800  Hep 2400 Sensipar 30 mg TIW  Hectorol 2 mcg TIW with dialysis Mircera 75 mcg q 2 weeks (last dose 04/12/2019) hgb 10.1 7/29   Assessment/Plan: 1. Chest pain hx CABG, and prior NSTEMIs; for LHC today, on heparin drip/plavix 2. ESRD:MWF K 4.6 - plan HD today after LHC.  3. Hypertension/volume:Hx chronic dCHF- EF 50 - 55% Suspect we can go down further on EDW (outpatient records indicated minimal gains) Currently on amlodipine 10, losartan 100, MTP XL 12.5 - imdur 90  4. Anemia:Last hemoglobin 9.6. No bleeding reported. Not due for ESA yet 5. Metabolic bone disease: Continue outpatient sensipar and hectorol. Continue calcium acetate 1 tab TID.  6. Nutrition:Recommend renal diet with fluid restrictions. 7. A. Fib:  per cardiology 8. Access bleeding issues - has had prolonged bleeding at outpatient unit and here. No bleeding issues at present.- on heparin drip; last AF 1569 stable 7/17 - had intervention mid 01/26/19 at [facility] by Dr. Augustin Coupe  - had 90% arch stenosis that required a stent nad 70% ouflow ceph stenosis- assessed by Dr. Cira Servant who recommends fistulagram after dc at [facility], will need to schedule  Kelly Splinter, MD 04/17/2019, 2:44 PM      Subjective:   No c/o post cath.  Getting put on hd right now  Objective Vitals:   04/17/19 0947 04/17/19 1002 04/17/19 1030 04/17/19 1100  BP: (!) 168/64 (!) 154/56 (!) 163/59 (!) 150/52  Pulse: (!) 55 (!) 54 (!) 55 (!) 56  Resp:      Temp:      TempSrc:      SpO2: 99% 99% 98%   Weight:       Physical Exam General: NAD sitting in chair watching video Heart: RRR Lungs: few crackles grossly clear Abdomen: soft NT Extremities: no LE edema Dialysis Access: left upper AVF no bleeding + bruit somewhat pulsatile   Additional Objective Labs: Basic Metabolic Panel: Recent Labs  Lab 04/14/19 1635 04/15/19 0545  04/16/19 0533 04/17/19 0645  NA 132* 134* 132* 130*  K 5.0 4.6 4.8 4.8  CL 93* 95* 91* 90*  CO2 28 28 27 24   GLUCOSE 211* 160* 168* 152*  BUN 15 24* 38* 50*  CREATININE 4.74* 6.38* 8.95* 11.70*  CALCIUM 8.8* 8.8* 9.3 9.5  PHOS 3.1  --   --   --    Liver Function Tests: Recent Labs  Lab 04/14/19 1635  ALBUMIN 3.6   No results for input(s): LIPASE, AMYLASE in the last 168 hours. CBC: Recent Labs  Lab 04/14/19 1314 04/15/19 0545 04/16/19 0533 04/17/19 0645  WBC 5.0 5.3 5.4 6.2  HGB 10.0* 9.5* 9.6* 9.8*  HCT 30.0* 28.1* 28.4* 28.4*  MCV 94.6 93.4 94.7 93.4  PLT 127* 140* 135* 133*   Blood Culture    Component Value Date/Time   SDES  10/13/2017 1552    TOE LEFT FOURTH Performed at [facility], [place] [address], [place], [place] [address]    [facility]  10/13/2017 1552    NONE Performed at [facility], [address] [place], [place] [address]    CULT  10/13/2017 1552    ABUNDANT METHICILLIN RESISTANT STAPHYLOCOCCUS AUREUS   REPTSTATUS 10/16/2017 FINAL 10/13/2017 1552    Cardiac Enzymes: No results for input(s): CKTOTAL, CKMB, CKMBINDEX, TROPONINI in the last 168 hours. CBG: Recent Labs  Lab 04/16/19 0634 04/16/19 1119 04/16/19 1615 04/16/19 2128 04/17/19 0624  GLUCAP 143* 142* 160* 158* 143*   Iron Studies: No results for input(s): IRON, TIBC, TRANSFERRIN, FERRITIN in the last 72 hours. Lab Results  Component Value Date   INR 0.95 07/07/2017   INR 1.24 11/03/2016   INR 1.10 06/12/2016   Studies/Results: No results found. Medications: . sodium chloride    . sodium chloride     . amLODipine  10 mg Oral Daily  . [START ON 04/18/2019] apixaban  5 mg Oral BID  . atorvastatin  80 mg Oral Daily  . calcium acetate  667 mg Oral TID WC  . Chlorhexidine Gluconate Cloth  6 each Topical Q0600  . cinacalcet  30 mg Oral Q M,W,F-HD  . citalopram  20 mg Oral Daily  . doxercalciferol  2 mcg Intravenous Q M,W,F-HD  . ezetimibe  10 mg Oral  Daily  . hydrALAZINE  25 mg Oral Q8H  . insulin aspart  0-9 Units Subcutaneous TID WC  . isosorbide mononitrate  90 mg Oral Daily  . losartan  100 mg Oral Daily  . metoprolol succinate  12.5 mg Oral QHS  . pantoprazole  40 mg Oral Daily  . sodium chloride flush  3 mL Intravenous Q12H  . sodium chloride flush  3 mL Intravenous Q12H

## 2019-04-17 NOTE — Plan of Care (Signed)
  Problem: Coping: Goal: Level of anxiety will decrease Outcome: Completed/Met   Problem: Elimination: Goal: Will not experience complications related to bowel motility Outcome: Completed/Met Goal: Will not experience complications related to urinary retention Outcome: Completed/Met   Problem: Pain Managment: Goal: General experience of comfort will improve Outcome: Completed/Met   Problem: Safety: Goal: Ability to remain free from injury will improve Outcome: Completed/Met   Problem: Skin Integrity: Goal: Risk for impaired skin integrity will decrease Outcome: Completed/Met

## 2019-04-17 NOTE — Progress Notes (Signed)
Spoke with pt's interpreter and wife regarding pt's plan of care.  Pt's spouse to pick pt after work around 1830.

## 2019-04-27 ENCOUNTER — Other Ambulatory Visit: Payer: Self-pay

## 2019-04-27 ENCOUNTER — Ambulatory Visit (HOSPITAL_COMMUNITY)
Admit: 2019-04-27 | Discharge: 2019-04-27 | Disposition: A | Discharge status: Home / Self Care | Payer: Medicare Other | Source: Ambulatory Visit | Attending: Cardiology | Admitting: Cardiology

## 2019-04-27 ENCOUNTER — Encounter (HOSPITAL_COMMUNITY): Payer: Self-pay | Admitting: Cardiology

## 2019-04-27 VITALS — BP 153/65 | HR 58 | Wt 147.2 lb

## 2019-04-27 DIAGNOSIS — Z794 Long term (current) use of insulin: Secondary | ICD-10-CM | POA: Insufficient documentation

## 2019-04-27 DIAGNOSIS — E1122 Type 2 diabetes mellitus with diabetic chronic kidney disease: Secondary | ICD-10-CM | POA: Diagnosis not present

## 2019-04-27 DIAGNOSIS — I255 Ischemic cardiomyopathy: Secondary | ICD-10-CM | POA: Diagnosis not present

## 2019-04-27 DIAGNOSIS — E785 Hyperlipidemia, unspecified: Secondary | ICD-10-CM | POA: Diagnosis not present

## 2019-04-27 DIAGNOSIS — N186 End stage renal disease: Secondary | ICD-10-CM | POA: Diagnosis not present

## 2019-04-27 DIAGNOSIS — I5042 Chronic combined systolic (congestive) and diastolic (congestive) heart failure: Secondary | ICD-10-CM | POA: Insufficient documentation

## 2019-04-27 DIAGNOSIS — I214 Non-ST elevation (NSTEMI) myocardial infarction: Secondary | ICD-10-CM | POA: Insufficient documentation

## 2019-04-27 DIAGNOSIS — I132 Hypertensive heart and chronic kidney disease with heart failure and with stage 5 chronic kidney disease, or end stage renal disease: Secondary | ICD-10-CM | POA: Diagnosis not present

## 2019-04-27 DIAGNOSIS — Z992 Dependence on renal dialysis: Secondary | ICD-10-CM | POA: Insufficient documentation

## 2019-04-27 DIAGNOSIS — I5032 Chronic diastolic (congestive) heart failure: Secondary | ICD-10-CM | POA: Diagnosis not present

## 2019-04-27 DIAGNOSIS — Z79899 Other long term (current) drug therapy: Secondary | ICD-10-CM | POA: Insufficient documentation

## 2019-04-27 DIAGNOSIS — Z7901 Long term (current) use of anticoagulants: Secondary | ICD-10-CM | POA: Diagnosis not present

## 2019-04-27 DIAGNOSIS — I480 Paroxysmal atrial fibrillation: Secondary | ICD-10-CM | POA: Insufficient documentation

## 2019-04-27 DIAGNOSIS — I25810 Atherosclerosis of coronary artery bypass graft(s) without angina pectoris: Secondary | ICD-10-CM | POA: Diagnosis not present

## 2019-04-27 DIAGNOSIS — Z7902 Long term (current) use of antithrombotics/antiplatelets: Secondary | ICD-10-CM | POA: Diagnosis not present

## 2019-04-27 DIAGNOSIS — I2510 Atherosclerotic heart disease of native coronary artery without angina pectoris: Secondary | ICD-10-CM | POA: Diagnosis not present

## 2019-04-27 DIAGNOSIS — I252 Old myocardial infarction: Secondary | ICD-10-CM | POA: Diagnosis not present

## 2019-04-27 DIAGNOSIS — Z955 Presence of coronary angioplasty implant and graft: Secondary | ICD-10-CM | POA: Diagnosis not present

## 2019-04-27 NOTE — Patient Instructions (Signed)
We will contact you in 6 months to schedule your next appointment.  At the [facility], you and your health needs are our priority. As part of our continuing mission to provide you with exceptional heart care, we have created designated Provider Care Teams. These Care Teams include your primary Cardiologist (physician) and Advanced Practice Providers (APPs- Physician Assistants and Nurse Practitioners) who all work together to provide you with the care you need, when you need it.   You may see any of the following providers on your designated Care Team at your next follow up: Marland Kitchen Dr Glori Bickers . Dr Loralie Champagne . Darrick Grinder, NP   Please be sure to bring in all your medications bottles to every appointment.

## 2019-04-27 NOTE — Progress Notes (Signed)
PCP: Dr. Tasia Catchings [facility]) Cardiology: Dr. Aundra Dubin  63 y.o. 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  He was admitted in 9/17 with NSTEMI.  LHC showed 3VD and he had CABG x 3.  He had CKD stage IV when he was admitted and developed ESRD requiring HD while in the hospital.  He had transient atrial fibrillation post-CABG.  He developed cardiogenic shock requiring milrinone but was able to taper off.  Echo in 9/17 showed EF 35-40%.    Admitted 8/1 through 04/16/2017 with malignant hypertension due to medication noncompliance. Continues on HD.  HF M-W-F. Discharge weight 140 pounds.   Admitted 10/18 with chest pain and pulmonary edema. NSTEMI noted on admit. Echo showed preserved EF but + WMAs. Cath with 95% ostial SVG-OM1 stenosis with TIMI 2 flow in graft.  Patient had DES to SVG-OM1.   Admitted in 12/18 with atypical chest pain, suspect not cardiac.   NSTEMI in 7/19 with severe in-stent restenosis in SVG-OM1, had DES x 2 to native OM1.  Echo showed EF 50%.  Atrial fibrillation was noted this admission.   He has had a chronic pattern of atypical chest pain.  Cardiolite in 3/20 showed EF 54%, small fixed inferior defect with no ischemia.   He was admitted in 8/20 with severe episode of chest pain.  Hs-TnI was mildly elevated without trend.  Cath was done, showing no target for PCI and stable disease. Echo was done showing EF 50-55% with moderate LVH and normal RV.    He presents today for followup of CHF and CAD.  An interpreter is present.  No further chest pain.  He does not get short of breath with exertion but fatigues easily.  He feels weak most of the time, he does not note a relation to dialysis.  No lightheadedness, orthopnea, PND, palpitations.     Labs (12/18): LDL 64, HDL 31 Labs (7/19): LDL 36  Labs (2/20): hgb 11.3, LDL 59, HDL 28  PMH: 1. Type II diabetes 2. HTN 3. ESRD 4. CAD:  NSTEMI 9/17 with CABG x 3 (LIMA-LAD, SVG-OM, SVG-RCA).   - NSTEMI 10/18 s/p DES to ostial SVG-OM - NSTEMI 7/19: severe in-stent restenosis in SVG-ramus, chronic total occlusion of SVG-RCA.  DES x 2 to native ramus.  - Cardiolite (3/20): EF 54%, small fixed inferior defect with no ischemia.  - LHC (8/20): 90% pLAD, subtotal occlusion mLAD, LIMA-LAD patent, occluded SVG-ramus, 40-50% in-stent restenosis in native ramus, 80% ostial small AV LCx (not target for intervention), occluded RCA, occluded SVG-RCA (chronic with left=>right collaterals).  No target for intervention.  5. Chronic systolic CHF: Ischemic cardiomyopathy.   - Echo (9/17) with EF 35-40%, grade II diastolic dysfunction.  - Echo (1/18) with EF 60-65%, grade II diastolic dysfunction. - Echo (10/18) with EF 55%, Akinesis of the basal-midinferolateral, inferior, and inferoseptal myocardiam. Akinesis of the basal anteroseptal myocardium.  - Echo (7/19): EF 50%, moderate LVH, mildly decreased RV systolic function.  - Echo (8/20): EF 50-55%, moderate LVH, normal RV 6. Atrial fibrillation: Paroxysmal.  Only noted post-op CABG.  7. ABIs normal in 12/17. 5/18 ABIs normal.  1/19 ABIs normal. 4/20 ABIs normal.  8. Carotid stenosis: Carotid dopplers (12/18) with < 50% right common carotid stenosis, 123456 LICA stenosis.  - Carotid dopplers (1/20): < 50% BICA stenosis.  9. Atrial fibrillation: Paroxysmal.  10. Cerebrovascular disease: Occluded right PICA and vertebral on 7/19  CTA   SH: Married, lives in [place], 1 son, nonsmoker, no ETOH, speaks only [place].   FH: No premature CAD.  Review of systems complete and found to be negative unless listed in HPI.    Current Outpatient Medications  Medication Sig Dispense Refill  . acetaminophen (TYLENOL) 500 MG tablet Take 500-1,000 mg by mouth every 6 (six) hours as needed for mild pain or headache.     Marland Kitchen amLODipine (NORVASC) 10 MG tablet Take 10 mg by mouth at bedtime.     Marland Kitchen apixaban (ELIQUIS) 5 MG  TABS tablet Take 1 tablet (5 mg total) by mouth 2 (two) times daily. 180 tablet 1  . Ascorbic Acid (VITAMIN C) 1000 MG tablet Take 1,000 mg by mouth daily.    Marland Kitchen atorvastatin (LIPITOR) 80 MG tablet Take 1 tablet (80 mg total) by mouth daily. (Patient taking differently: Take 80 mg by mouth daily with lunch. ) 90 tablet 3  . calcium acetate (PHOSLO) 667 MG capsule Take 667 mg by mouth 3 (three) times daily with meals.     . citalopram (CELEXA) 20 MG tablet Take 20 mg by mouth daily with lunch.   1  . ezetimibe (ZETIA) 10 MG tablet Take 1 tablet by mouth once daily (Patient taking differently: Take 10 mg by mouth daily. ) 90 tablet 0  . gabapentin (NEURONTIN) 100 MG capsule Take 100 mg by mouth daily with lunch.   0  . hydrALAZINE (APRESOLINE) 25 MG tablet Take 1 tablet (25 mg total) by mouth every 8 (eight) hours. 90 tablet 6  . Insulin Human (INSULIN PUMP) SOLN Inject into the skin continuous. NOVOLOG    . isosorbide mononitrate (IMDUR) 60 MG 24 hr tablet Take 1.5 tablets (90 mg total) by mouth daily. (Patient taking differently: Take 60 mg by mouth daily. ) 135 tablet 3  . losartan (COZAAR) 100 MG tablet Take 100 mg by mouth at bedtime.     . metoprolol succinate (TOPROL-XL) 25 MG 24 hr tablet Take 1 tablet (25 mg total) by mouth at bedtime. (Patient taking differently: Take 12.5 mg by mouth at bedtime. ) 90 tablet 3  . NOVOLOG 100 UNIT/ML injection See admin instructions. "MEDTRONIC 630 G PUMP: BASAL 12A 0.7 6A 0.85 10P 0.7 BOLUS 12A 12 11A 10. ISF 60. INJECT TOTAL OF 60 UNITS SUBCUTANEOUSLY DAILY"    . ofloxacin (OCUFLOX) 0.3 % ophthalmic solution Place 1 drop into the right eye See admin instructions. Instill 1 drop into the right eye four times a day for 5 days following monthly eye injection    . pantoprazole (PROTONIX) 40 MG tablet Take 40 mg by mouth daily before lunch.   0   No current facility-administered medications for this encounter.    Vitals:   04/27/19 1009  BP: (!) 153/65   Pulse: (!) 58  SpO2: 97%  Weight: 66.8 kg (147 lb 3.2 oz)   Wt Readings from Last 3 Encounters:  04/27/19 66.8 kg (147 lb 3.2 oz)  04/17/19 63 kg (138 lb 14.2 oz)  04/13/19 67.5 kg (148 lb 12.8 oz)   General: NAD Neck: No JVD, no thyromegaly or thyroid nodule.  Lungs: Clear to auscultation bilaterally with normal respiratory effort. CV: Nondisplaced PMI.  Heart regular S1/S2, no S3/S4, no murmur.  No peripheral edema.  No carotid bruit.  Normal pedal pulses.  Abdomen: Soft, nontender, no hepatosplenomegaly, no distention.  Skin: Intact without lesions or rashes.  Neurologic: Alert and oriented x 3.  Psych: Normal affect. Extremities:  No clubbing or cyanosis.  HEENT: Normal.   Assessment/Plan: 1. CAD: s/p CABG.  NSTEMI 10/18, cath with 95% ostial SVG-ramus stenosis with TIMI 2 flow in graft, s/p DES to SVG-ramus 07/07/17. NSTEMI 7/19 with in-stent restenosis in the SVG-ramus, had DES x 2 to native ramus.   He has had chronic atypical chest pain episodes, Cardiolite in 3/20 showed no ischemia.  He had an episode of severe CP prior to last appointment, and he had repeat cath in 8/20.  This showed occluded SVG-ramus (new) but continued patency of the native ramus s/p stenting.  No target for intervention.  He has not had further chest pain.  - He will continue Eliquis, can stop Plavix as it has been > 1 year since last PCI.  - Continue Imdur 60 mg daily.  - Continue atorvastatin 80 mg daily.  2. Hyperlipidemia: Continue atorvastatin and Zetia.  Good LDL in 2/20.      3. Chronic diastolic CHF:  Ischemic cardiomyopathy.  EF low in past but improved after CABG. Most recent echo 8/20 with EF 50-55%.     - On dialysis for volume control.  - Continue Toprol XL 12.5 mg at bed time as well as losartan.  4. Atrial fibrillation: Paroxysmal, NSR recently.  - Continue Eliquis.   5. HTN: BP mildly high today, hydralazine started at last hospitalization.  No changes today.  6. ESRD: MWF.  His  fatigue/weakness may be related to dialysis/fluid shifts.   7. Carotid bruit: Mild carotid disease on dopplers in 1/20. 8. PAD: Normal ABIs in 4/20.    Followup in 6 months.   Loralie Champagne 04/27/2019

## 2019-05-09 ENCOUNTER — Other Ambulatory Visit (HOSPITAL_COMMUNITY): Payer: Self-pay | Admitting: Cardiology

## 2019-05-30 ENCOUNTER — Other Ambulatory Visit (HOSPITAL_COMMUNITY): Payer: Self-pay | Admitting: Cardiology

## 2019-06-27 ENCOUNTER — Other Ambulatory Visit (HOSPITAL_COMMUNITY): Payer: Self-pay | Admitting: Cardiology

## 2019-07-11 ENCOUNTER — Other Ambulatory Visit: Payer: Self-pay | Admitting: *Deleted

## 2019-07-11 ENCOUNTER — Other Ambulatory Visit: Payer: Self-pay

## 2019-07-11 ENCOUNTER — Encounter: Payer: Self-pay | Admitting: *Deleted

## 2019-07-11 ENCOUNTER — Encounter: Payer: Self-pay | Admitting: Vascular Surgery

## 2019-07-11 ENCOUNTER — Ambulatory Visit (INDEPENDENT_AMBULATORY_CARE_PROVIDER_SITE_OTHER): Payer: Medicare Other | Admitting: Vascular Surgery

## 2019-07-11 VITALS — BP 150/66 | HR 56 | Temp 97.2°F | Resp 20 | Ht 67.0 in | Wt 148.0 lb

## 2019-07-11 DIAGNOSIS — Z992 Dependence on renal dialysis: Secondary | ICD-10-CM

## 2019-07-11 DIAGNOSIS — I200 Unstable angina: Secondary | ICD-10-CM | POA: Diagnosis not present

## 2019-07-11 DIAGNOSIS — N186 End stage renal disease: Secondary | ICD-10-CM

## 2019-07-11 NOTE — H&P (View-Only) (Signed)
Vascular and Vein Specialist of   Patient name: Kenneth [name] MRN: [id number] DOB: 1956/04/08 Sex: male  REASON FOR VISIT: Evaluation of ulceration and thinning left upper arm AV fistula  HPI: Kenneth [name] is a 63 y.o. male here today for evaluation of left upper arm AV fistula.  He has hemodialysis on Monday Wednesday Friday.  He is here with his wife.  We had had a [name] via audiovisual monitor for the entire encounter.  He reports that he has had some episodes of bleeding from his lower segment of his fistula towards his antecubital space.  His wife reports that he did have intervention with what sounds like angioplasty had [facility] in August 2020.  He denies any difficulty with dialysis runs.  Does not report having to come off Josaphine Shimamoto or clotting the machine.  Past Medical History:  Diagnosis Date  . Acute pulmonary edema ([place]) 05/31/2016  . Acute respiratory failure with hypoxemia ([place]) 05/31/2016  . CAD (coronary artery disease) 10/30/2016   NSTEMI 9/17 with CABG x 3 (LIMA-LAD, SVG-OM, SVG-RCA).   - NSTEMI 10/18 s/p DES to ostial SVG to  OM  . Depression   . Diabetic foot ulcer ([place]) 04/11/2017  . 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 ESRD (end stage renal disease) on dialysis [facility])    "MWF; [facility]" (03/30/2018)  . Gastroesophageal reflux disease 08/18/2016  . HCAP (healthcare-associated pneumonia)   . Hemodialysis status ([place])   . Hyperlipidemia   . Hypertension   . Hypertensive heart disease 09/12/2017  . Ischemic rest pain of lower extremity 02/06/2015  . 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 NSTEMI (non-ST elevated myocardial infarction) ([place])   . Pleural effusion   . 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 CAD Father   . Colon cancer Father   . Diabetes Brother     SOCIAL HISTORY: Social History   Tobacco Use  . Smoking  status: Former Smoker    Packs/day: 1.50    Years: 35.00    Pack years: 52.50    Types: Cigarettes    Quit date: 2014    Years since quitting: 6.8  . Smokeless tobacco: Never Used  . Tobacco comment: "quit e-cigs ~ 2014"  Substance Use Topics  . Alcohol use: Never    Alcohol/week: 0.0 standard drinks    Frequency: Never    No Known Allergies  Current Outpatient Medications  Medication Sig Dispense Refill  . acetaminophen (TYLENOL) 500 MG tablet Take 500-1,000 mg by mouth every 6 (six) hours as needed for mild pain or headache.     Marland Kitchen amLODipine (NORVASC) 10 MG tablet Take 10 mg by mouth at bedtime.     Marland Kitchen apixaban (ELIQUIS) 5 MG TABS tablet Take 1 tablet (5 mg total) by mouth 2 (two) times daily. 180 tablet 1  . Ascorbic Acid (VITAMIN C) 1000 MG tablet Take 1,000 mg by mouth daily.    Marland Kitchen atorvastatin (LIPITOR) 80 MG tablet Take 1 tablet by mouth once daily 90 tablet 0  . calcium acetate (PHOSLO) 667 MG capsule Take 667 mg by mouth 3 (three) times daily with meals.     . citalopram (CELEXA) 20 MG tablet Take 20 mg by mouth daily with lunch.   1  . ezetimibe (ZETIA) 10 MG tablet Take 1 tablet by mouth once daily 90 tablet 0  . gabapentin (NEURONTIN) 100 MG capsule Take  100 mg by mouth daily with lunch.   0  . hydrALAZINE (APRESOLINE) 25 MG tablet Take 1 tablet (25 mg total) by mouth every 8 (eight) hours. 90 tablet 6  . Insulin Human (INSULIN PUMP) SOLN Inject into the skin continuous. NOVOLOG    . isosorbide mononitrate (IMDUR) 60 MG 24 hr tablet Take 1 tablet by mouth once daily 90 tablet 0  . losartan (COZAAR) 100 MG tablet Take 100 mg by mouth at bedtime.     . metoprolol succinate (TOPROL-XL) 25 MG 24 hr tablet Take 1 tablet (25 mg total) by mouth at bedtime. (Patient taking differently: Take 12.5 mg by mouth at bedtime. ) 90 tablet 3  . NOVOLOG 100 UNIT/ML injection See admin instructions. "MEDTRONIC 630 G PUMP: BASAL 12A 0.7 6A 0.85 10P 0.7 BOLUS 12A 12 11A 10. ISF 60. INJECT TOTAL  OF 60 UNITS SUBCUTANEOUSLY DAILY"    . ofloxacin (OCUFLOX) 0.3 % ophthalmic solution Place 1 drop into the right eye See admin instructions. Instill 1 drop into the right eye four times a day for 5 days following monthly eye injection    . pantoprazole (PROTONIX) 40 MG tablet Take 40 mg by mouth daily before lunch.   0   No current facility-administered medications for this visit.     REVIEW OF SYSTEMS:  [X]  denotes positive finding, [ ]  denotes negative finding Cardiac  Comments:  Chest pain or chest pressure:    Shortness of breath upon exertion:    Short of breath when lying flat:    Irregular heart rhythm:        Vascular    Pain in calf, thigh, or hip brought on by ambulation: x   Pain in feet at night that wakes you up from your sleep:  x   Blood clot in your veins:    Leg swelling:  x         PHYSICAL EXAM: Vitals:   07/11/19 0903  BP: (!) 150/66  Pulse: (!) 56  Resp: 20  Temp: (!) 97.2 F (36.2 C)  SpO2: 97%  Weight: 148 lb (67.1 kg)  Height: 5\' 7"  (1.702 m)    GENERAL: The patient is a well-nourished male, in no acute distress. The vital signs are documented above. CARDIOVASCULAR: Palpable left radial pulse.  Well developed fistula from his antecubital space proximally on his forearm.  He does have aneurysmal change at the puncture areas above the antecubital space and in the mid upper arm.  The lower area towards the antecubital space is very concerning with thinning of skin and also a superficial ulceration.  The skin is not mobile above the vein. PULMONARY: There is good air exchange  MUSCULOSKELETAL: There are no major deformities or cyanosis. NEUROLOGIC: No focal weakness or paresthesias are detected. SKIN: There are no ulcers or rashes noted. PSYCHIATRIC: The patient has a normal affect.  DATA:  None  MEDICAL ISSUES: I discussed options with the patient.  I am concerned regarding bleeding from this lower segment and also thinning of skin and ulceration.   I have recommended repair with resection of the skin overlying this and plication of the vein.  Discussed this would be done as an outpatient.  He will continue to use the fistula above this until the area heals.  We will stop his Eliquis several days ahead of the procedure.  He is on this for chronic atrial fibrillation.    Rosetta Posner, MD FACS [facility]  Tel [phone] Pager [phone]

## 2019-07-11 NOTE — Progress Notes (Signed)
Vascular and Vein Specialist of [place]  Patient name: Kenneth [name] MRN: [id number] DOB: [birth year] Sex: male  REASON FOR VISIT: Evaluation of ulceration and thinning left upper arm AV fistula  HPI: Kenneth [name] is a 63 y.o. male here today for evaluation of left upper arm AV fistula.  He has hemodialysis on Monday Wednesday Friday.  He is here with his wife.  We had had a [name] via audiovisual monitor for the entire encounter.  He reports that he has had some episodes of bleeding from his lower segment of his fistula towards his antecubital space.  His wife reports that he did have intervention with what sounds like angioplasty had [facility] in August 2020.  He denies any difficulty with dialysis runs.  Does not report having to come off early or clotting the machine.  Past Medical History:  Diagnosis Date  . Acute pulmonary edema ([place]) 05/31/2016  . Acute respiratory failure with hypoxemia ([place]) 05/31/2016  . CAD (coronary artery disease) 10/30/2016   NSTEMI 9/17 with CABG x 3 (LIMA-LAD, SVG-OM, SVG-RCA).   - NSTEMI 10/18 s/p DES to ostial SVG to  OM  . Depression   . Diabetic foot ulcer ([place]) 04/11/2017  . 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[place]) 02/06/2015  . ESRD (end stage renal disease) on dialysis [facility])    "MWF; [place]" (03/30/2018)  . Gastroesophageal reflux disease 08/18/2016  . HCAP (healthcare-associated pneumonia)   . Hemodialysis status ([place])   . Hyperlipidemia   . Hypertension   . Hypertensive heart disease 09/12/2017  . Ischemic rest pain of lower extremity 02/06/2015  . 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 NSTEMI (non-ST elevated myocardial infarction) ([place])   . Pleural effusion   . 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 CAD Father   . Colon cancer Father   . Diabetes Brother     SOCIAL HISTORY: Social History   Tobacco Use  . Smoking  status: Former Smoker    Packs/day: 1.50    Years: 35.00    Pack years: 52.50    Types: Cigarettes    Quit date: 2014    Years since quitting: 6.8  . Smokeless tobacco: Never Used  . Tobacco comment: "quit e-cigs ~ 2014"  Substance Use Topics  . Alcohol use: Never    Alcohol/week: 0.0 standard drinks    Frequency: Never    No Known Allergies  Current Outpatient Medications  Medication Sig Dispense Refill  . acetaminophen (TYLENOL) 500 MG tablet Take 500-1,000 mg by mouth every 6 (six) hours as needed for mild pain or headache.     Marland Kitchen amLODipine (NORVASC) 10 MG tablet Take 10 mg by mouth at bedtime.     Marland Kitchen apixaban (ELIQUIS) 5 MG TABS tablet Take 1 tablet (5 mg total) by mouth 2 (two) times daily. 180 tablet 1  . Ascorbic Acid (VITAMIN C) 1000 MG tablet Take 1,000 mg by mouth daily.    Marland Kitchen atorvastatin (LIPITOR) 80 MG tablet Take 1 tablet by mouth once daily 90 tablet 0  . calcium acetate (PHOSLO) 667 MG capsule Take 667 mg by mouth 3 (three) times daily with meals.     . citalopram (CELEXA) 20 MG tablet Take 20 mg by mouth daily with lunch.   1  . ezetimibe (ZETIA) 10 MG tablet Take 1 tablet by mouth once daily 90 tablet 0  . gabapentin (NEURONTIN) 100 MG capsule Take  100 mg by mouth daily with lunch.   0  . hydrALAZINE (APRESOLINE) 25 MG tablet Take 1 tablet (25 mg total) by mouth every 8 (eight) hours. 90 tablet 6  . Insulin Human (INSULIN PUMP) SOLN Inject into the skin continuous. NOVOLOG    . isosorbide mononitrate (IMDUR) 60 MG 24 hr tablet Take 1 tablet by mouth once daily 90 tablet 0  . losartan (COZAAR) 100 MG tablet Take 100 mg by mouth at bedtime.     . metoprolol succinate (TOPROL-XL) 25 MG 24 hr tablet Take 1 tablet (25 mg total) by mouth at bedtime. (Patient taking differently: Take 12.5 mg by mouth at bedtime. ) 90 tablet 3  . NOVOLOG 100 UNIT/ML injection See admin instructions. "MEDTRONIC 630 G PUMP: BASAL 12A 0.7 6A 0.85 10P 0.7 BOLUS 12A 12 11A 10. ISF 60. INJECT TOTAL  OF 60 UNITS SUBCUTANEOUSLY DAILY"    . ofloxacin (OCUFLOX) 0.3 % ophthalmic solution Place 1 drop into the right eye See admin instructions. Instill 1 drop into the right eye four times a day for 5 days following monthly eye injection    . pantoprazole (PROTONIX) 40 MG tablet Take 40 mg by mouth daily before lunch.   0   No current facility-administered medications for this visit.     REVIEW OF SYSTEMS:  [X]  denotes positive finding, [ ]  denotes negative finding Cardiac  Comments:  Chest pain or chest pressure:    Shortness of breath upon exertion:    Short of breath when lying flat:    Irregular heart rhythm:        Vascular    Pain in calf, thigh, or hip brought on by ambulation: x   Pain in feet at night that wakes you up from your sleep:  x   Blood clot in your veins:    Leg swelling:  x         PHYSICAL EXAM: Vitals:   07/11/19 0903  BP: (!) 150/66  Pulse: (!) 56  Resp: 20  Temp: (!) 97.2 F (36.2 C)  SpO2: 97%  Weight: 148 lb (67.1 kg)  Height: 5\' 7"  (1.702 m)    GENERAL: The patient is a well-nourished male, in no acute distress. The vital signs are documented above. CARDIOVASCULAR: Palpable left radial pulse.  Well developed fistula from his antecubital space proximally on his forearm.  He does have aneurysmal change at the puncture areas above the antecubital space and in the mid upper arm.  The lower area towards the antecubital space is very concerning with thinning of skin and also a superficial ulceration.  The skin is not mobile above the vein. PULMONARY: There is good air exchange  MUSCULOSKELETAL: There are no major deformities or cyanosis. NEUROLOGIC: No focal weakness or paresthesias are detected. SKIN: There are no ulcers or rashes noted. PSYCHIATRIC: The patient has a normal affect.  DATA:  None  MEDICAL ISSUES: I discussed options with the patient.  I am concerned regarding bleeding from this lower segment and also thinning of skin and ulceration.   I have recommended repair with resection of the skin overlying this and plication of the vein.  Discussed this would be done as an outpatient.  He will continue to use the fistula above this until the area heals.  We will stop his Eliquis several days ahead of the procedure.  He is on this for chronic atrial fibrillation.    Rosetta Posner, MD FACS [facility] Vein Specialists of [facility]  Tel [phone] Pager [phone]

## 2019-07-11 NOTE — Progress Notes (Signed)
Reviewed all pre-op instructions with patient and family with Video interpreter(John [phone]). Time allowed and all questions answered.

## 2019-07-16 ENCOUNTER — Other Ambulatory Visit (HOSPITAL_COMMUNITY): Payer: Self-pay | Admitting: Student

## 2019-07-25 ENCOUNTER — Other Ambulatory Visit (HOSPITAL_COMMUNITY)
Admission: RE | Admit: 2019-07-25 | Discharge: 2019-07-25 | Disposition: A | Discharge status: Home / Self Care | Payer: Medicare Other | Source: Ambulatory Visit | Attending: Vascular Surgery | Admitting: Vascular Surgery

## 2019-07-25 DIAGNOSIS — Z01812 Encounter for preprocedural laboratory examination: Secondary | ICD-10-CM | POA: Diagnosis present

## 2019-07-25 DIAGNOSIS — Z20828 Contact with and (suspected) exposure to other viral communicable diseases: Secondary | ICD-10-CM | POA: Diagnosis not present

## 2019-07-25 LAB — SARS CORONAVIRUS 2 (TAT 6-24 HRS): SARS Coronavirus 2: NEGATIVE

## 2019-07-26 ENCOUNTER — Other Ambulatory Visit: Payer: Self-pay

## 2019-07-26 ENCOUNTER — Encounter (HOSPITAL_COMMUNITY): Payer: Self-pay | Admitting: Vascular Surgery

## 2019-07-26 NOTE — Anesthesia Preprocedure Evaluation (Deleted)
Anesthesia Evaluation    Airway        Dental   Pulmonary former smoker,           Cardiovascular hypertension,      Neuro/Psych    GI/Hepatic   Endo/Other  diabetes  Renal/GU      Musculoskeletal   Abdominal   Peds  Hematology   Anesthesia Other Findings   Reproductive/Obstetrics                                                              Anesthesia Evaluation  Patient identified by MRN, date of birth, ID band Patient awake    Reviewed: Allergy & Precautions, H&P , NPO status , Patient's Chart, lab work & pertinent test results, reviewed documented beta blocker date and time   Airway Mallampati: III  TM Distance: >3 FB Neck ROM: Full    Dental no notable dental hx. (+) Teeth Intact, Dental Advisory Given   Pulmonary former smoker,    Pulmonary exam normal breath sounds clear to auscultation       Cardiovascular hypertension, + CAD, + Past MI, + CABG, + Peripheral Vascular Disease and +CHF   Rhythm:Regular Rate:Normal     Neuro/Psych negative neurological ROS  negative psych ROS   GI/Hepatic negative GI ROS, Neg liver ROS,   Endo/Other  diabetes  Renal/GU CRF and DialysisRenal disease  negative genitourinary   Musculoskeletal   Abdominal   Peds  Hematology negative hematology ROS (+)   Anesthesia Other Findings   Reproductive/Obstetrics negative OB ROS                             Anesthesia Physical  Anesthesia Plan  ASA: IV  Anesthesia Plan: MAC   Post-op Pain Management:    Induction: Intravenous  Airway Management Planned: Simple Face Mask  Additional Equipment:   Intra-op Plan:   Post-operative Plan:   Informed Consent: I have reviewed the patients History and Physical, chart, labs and discussed the procedure including the risks, benefits and alternatives for the proposed anesthesia with the patient or  authorized representative who has indicated his/her understanding and acceptance.   Dental advisory given  Plan Discussed with: CRNA  Anesthesia Plan Comments:         Anesthesia Quick Evaluation  Anesthesia Physical Anesthesia Plan  ASA:   Anesthesia Plan:    Post-op Pain Management:    Induction:   PONV Risk Score and Plan:   Airway Management Planned:   Additional Equipment:   Intra-op Plan:   Post-operative Plan:   Informed Consent:   Plan Discussed with:   Anesthesia Plan Comments: (PAT note written 07/26/2019 by Myra Gianotti, PA-C. SAME DAY WORK-UP   )        Anesthesia Quick Evaluation

## 2019-07-26 NOTE — Progress Notes (Signed)
Used [employer] interpreter [phone] Vallarie Mare.  Unable to reach patient but was able to get in touch with his wife [name].  Per [name],  patient does not have shortness of breath, fever, cough and chest pain.  PCP - Dr Di Kindle Cardiologist - Dr Loralie Champagne  Chest x-ray - 12/04/17 EKG - 04/13/19 Stress Test - 11/15/18 ECHO - 04/14/19 Cardiac Cath - 04/17/19  Fasting Blood Sugar - 80-120s Checks Blood Sugar 3-4 times a day On Novolog Insulin Pump.    [name] does not know pump rate, unable to reach patient.  Called Dr Roanna Banning, Anesthesia.  Advise me to have patient come in at 0630 so we can see what his rate is on his pump and what adjustments we may need to do prior to his surgical procedure.  Wife [name] Informed by Pac Int # [phone] Vivien Rota.)  . If your blood sugar is less than 70 mg/dL, you will need to treat for low blood sugar: o Treat a low blood sugar (less than 70 mg/dL) with  cup of clear juice (cranberry or apple), 4 glucose tablets, OR glucose gel. o Recheck blood sugar in 15 minutes after treatment (to make sure it is greater than 70 mg/dL). If your blood sugar is not greater than 70 mg/dL on recheck, call [phone] for further instructions.  Blood Thinner Instructions: Hold Eliquis 2 days prior to surgery per MD.  Last dose on 07/25/19    Anesthesia review: Yes  STOP now taking any Aspirin (unless otherwise instructed by your surgeon), Aleve, Naproxen, Ibuprofen, Motrin, Advil, Goody's, BC's, all herbal medications, fish oil, and all vitamins.   Coronavirus Screening Covid test on 07/25/19 was negative.  Patient verbalized understanding of instructions that were given via phone.

## 2019-07-26 NOTE — Progress Notes (Signed)
Anesthesia Chart Review: Kenneth [name]   Case: [id number] Date/Time: 07/27/19 0000000   Procedure: PLICATION OF ANEURYSM ARTERIOVENOUS FISTULA  LEFT ARM (Left )   Anesthesia type: Choice   Pre-op diagnosis: ANEURYSM OF ARTERIOVENOUS FISTULA LEFT ARM   Location: [facility] OR ROOM 12 / [place] [facility]   Surgeon: Rosetta Posner, MD     Needs [name] - arranged.   DISCUSSION: Patient is a 63 year old male scheduled for the above procedure. He has thinning and some episodes of bleeding from the lower segment of his AVF. History is significant for CAD with last cath 04/17/2019 showing 1/3 patent grafts and no PCI targets, so medical therapy recommended.   History includes ESRD (HD MWF; left brachiocephalic AVF [id number]), former smoker (quit 2014), CAD (NSTEMI 05/2016, s/p CABG: LIMA-LAD, SVG-OM, SVG-RCA 06/05/16; NSTEMI, occluded SVG-RCA 11/03/16 with collaterals, medical therapy; NSTEMI 95% ostial SVG-OM1, s/p DES 07/07/17; NSTEMI, s/p DES x2 native OM1 03/30/18; elevated but flat troponins 04/17/19: Occluded SVG-RCA and SVG-OM/"Ramus" with 40-50% in-stent stenosis, patent LIMA-LAD, 80% ostial small AV LCx, no PCI target, medical thearpy), CHF, PAF (post-CABG), metatarsal deformity (pronation of feet), DM2, GERD, HLD, HTN, PAD (ABI WNL 12/15/18).  Last cardiology follow-up with Dr. Aundra Dubin 04/27/19. No further chest pain, but felt fatigues and weak most of the time. No SOB, orthopnea, PND, palpitations at that time. Six month follow-up planned. Plavix discontinued as last PCI > 1 year. Continued on Eliquis and Imdur.   Per [facility], he was instructed to hold Eliquis x 2 days prior to surgery. 07/25/19 COVID-19 test negative. He is a same day work-up, so labs and further evaluation by anesthesia team on the day of surgery.    VS:  BP Readings from Last 3 Encounters:  07/11/19 (!) 150/66  04/27/19 (!) 153/65  04/17/19 (!) 117/55   Pulse Readings from Last 3 Encounters:  07/11/19 (!) 56  04/27/19 (!) 58  04/17/19 63      PROVIDERS: [name], MD is PCP  Loralie Champagne, MD is HF cardiologist. Last visit 04/27/19.    LABS: He is for labs on arrival. A1c 7.0 on 04/14/19.   EKG: 04/13/19: Sinus bradycardia at 50 bpm Rightward axis ST & T wave abnormality, consider lateral ischemia Prolonged QT Abnormal ECG Confirmed by Ida Rogue [phone]) on 04/13/2019 5:20:12 PM    CV:  Cardiac cath 04/17/19: Left Anterior Descending  90% proximal LAD stenosis, subtotal occlusion of the mid LAD. LIMA-LAD is patent.  Ramus Intermedius  Large ramus with long stented segment in the proximal to mid vessel. 40-50% in-stent restenosis in the distal stented segment. Totally occluded SVG-ramus.  Left Circumflex  The AV LCx is small in caliber, 80% ostial stenosis.  Right Coronary Artery  Occluded proximal RCA. The SVG-RCA is known to be occluded from prior caths and was not injected. There were extensive left to right collaterals.  CONCLUSION: 1. Occluded SVG-RCA and SVG-ramus, patent LIMA-LAD.  2. The large ramus was patent with 40-50% mid ramus stenosis (in-stent restenosis).  3. 80% ostial stenosis very small caliber AV LCx.  Not interventional target.  4. Occluded RCA (known from prior) with collaterals.  - No target for intervention.  Aggressive medical management of angina.    Echo 04/14/19: IMPRESSIONS  1. Mild hypokinesis of the left ventricular, basal inferolateral wall.  2. The left ventricle has low normal systolic function, with an ejection fraction of 50-55%. The cavity size was normal. There is moderate concentric left ventricular hypertrophy. Left ventricular diastolic  Doppler parameters are consistent with  pseudonormalization.  3. The right ventricle has normal systolic function. The cavity was normal. There is no increase in right ventricular wall thickness. Right ventricular systolic pressure could not be assessed.  4. Left atrial size was mildly dilated.  5. Right atrial size was  mildly dilated.  6. No evidence of mitral valve stenosis.  7. The aortic valve is tricuspid. Mild thickening of the aortic valve. Mild calcification of the aortic valve. No stenosis of the aortic valve.  8. The aorta is normal in size and structure.  9. The aortic root is normal in size and structure. SUMMARY Compared to prior, EF is similar to slightly improved, wall motion is only mildy hypokinetic in basal inferolateral wall.   Carotid US 09/15/18: Summary: - Right Carotid: Non-hemodynamically significant plaque <50% noted in the CCA.                The ECA appears >50% stenosed. The extracranial vessels were                near-normal with only minimal wall thickening or plaque. - Left Carotid: Velocities in the left ICA are consistent with a 1-39% stenosis.               Non-hemodynamically significant plaque noted in the CCA. - Vertebrals:  Left vertebral artery demonstrates antegrade flow.              Right vertebral artery demonstrates probable occlusion. - Subclavians: Normal flow hemodynamics were seen in the right subclavian artery.              The left subclavian artery has normal low resistance and turbulent              flow due to AVF.   Past Medical History:  Diagnosis Date  . Acute pulmonary edema ([place]) 05/31/2016  . Acute respiratory failure with hypoxemia ([place]) 05/31/2016  . CAD (coronary artery disease) 10/30/2016   NSTEMI 9/17 with CABG x 3 (LIMA-LAD, SVG-OM, SVG-RCA).   - NSTEMI 10/18 s/p DES to ostial SVG to  OM  . Depression   . Diabetic foot ulcer ([place]) 04/11/2017  . 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[place]) 02/06/2015  . ESRD (end stage renal disease) on dialysis [facility])    "MWF; [place]" (03/30/2018)  . Gastroesophageal reflux disease 08/18/2016  . HCAP (healthcare-associated pneumonia)   . Hemodialysis status ([place])   . Hyperlipidemia   . Hypertension   . Hypertensive heart disease 09/12/2017  . Ischemic rest pain of lower extremity 02/06/2015  . Keratoma 02/27/2015   . 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 NSTEMI (non-ST elevated myocardial infarction) ([place])   . PAF (paroxysmal atrial fibrillation) ([place])   . Pleural effusion   . Pronation deformity of both feet 02/27/2015    Past Surgical History:  Procedure Laterality Date  . AV FISTULA PLACEMENT Left 06/22/2016   Procedure: ARTERIOVENOUS (AV) FISTULA CREATION LEFT UPPER ARM;  Surgeon: Rosetta Posner, MD;  Location: [facility];  Service: Vascular;  Laterality: Left;  . CARDIAC CATHETERIZATION N/A 05/31/2016   Procedure: Left Heart Cath and Coronary Angiography;  Surgeon: Jettie Booze, MD;  Location: [place] CV LAB;  Service: Cardiovascular;  Laterality: N/A;  . CARDIAC CATHETERIZATION N/A 05/31/2016   Procedure: Right Heart Cath;  Surgeon: Jettie Booze, MD;  Location: [place] CV LAB;  Service: Cardiovascular;  Laterality: N/A;  . CARDIAC CATHETERIZATION N/A 05/31/2016   Procedure: IABP Insertion;  Surgeon: Jettie Booze, MD;  Location: [place] CV LAB;  Service: Cardiovascular;  Laterality: N/A;  . CORONARY ARTERY BYPASS GRAFT N/A 06/05/2016   Procedure: CORONARY ARTERY BYPASS GRAFTING (CABG) x3 LIMA to LAD -SVG to OM -SVG to RCA;  Surgeon: Ivin Poot, MD;  Location: [facility];  Service: Open Heart Surgery;  Laterality: N/A;  . CORONARY STENT INTERVENTION N/A 07/07/2017   Procedure: CORONARY STENT INTERVENTION;  Surgeon: Wellington Hampshire, MD;  Location: [place] CV LAB;  Service: Cardiovascular;  Laterality: N/A;  . CORONARY STENT INTERVENTION N/A 03/30/2018   Procedure: CORONARY STENT INTERVENTION;  Surgeon: Martinique, Peter M, MD;  Location: [place] CV LAB;  Service: Cardiovascular;  Laterality: N/A;  . INSERTION OF DIALYSIS CATHETER N/A 06/05/2016   Procedure: INSERTION OF DIALYSIS/trialysis CATHETER;  Surgeon: Ivin Poot, MD;  Location: [place];  Service: Vascular;  Laterality: N/A;  . INSERTION OF DIALYSIS CATHETER Right 06/13/2016   Procedure: INSERTION OF DIALYSIS CATHETER  RIGHT INTERNAL JUGULAR;  Surgeon: Conrad Hamlet, MD;  Location: [place];  Service: Vascular;  Laterality: Right;  . INTRAOPERATIVE TRANSESOPHAGEAL ECHOCARDIOGRAM N/A 06/05/2016   Procedure: INTRAOPERATIVE TRANSESOPHAGEAL ECHOCARDIOGRAM;  Surgeon: Ivin Poot, MD;  Location: [place];  Service: Open Heart Surgery;  Laterality: N/A;  . LEFT HEART CATH AND CORS/GRAFTS ANGIOGRAPHY N/A 11/03/2016   Procedure: Left Heart Cath and Cors/Grafts Angiography;  Surgeon: Troy Sine, MD;  Location: [place] CV LAB;  Service: Cardiovascular;  Laterality: N/A;  . LEFT HEART CATH AND CORS/GRAFTS ANGIOGRAPHY N/A 07/07/2017   Procedure: LEFT HEART CATH AND CORS/GRAFTS ANGIOGRAPHY;  Surgeon: Larey Dresser, MD;  Location: [place] CV LAB;  Service: Cardiovascular;  Laterality: N/A;  . LEFT HEART CATH AND CORS/GRAFTS ANGIOGRAPHY N/A 03/30/2018   Procedure: LEFT HEART CATH AND CORS/GRAFTS ANGIOGRAPHY;  Surgeon: Martinique, Peter M, MD;  Location: [place] CV LAB;  Service: Cardiovascular;  Laterality: N/A;  . LEFT HEART CATH AND CORS/GRAFTS ANGIOGRAPHY N/A 04/17/2019   Procedure: LEFT HEART CATH AND CORS/GRAFTS ANGIOGRAPHY;  Surgeon: Larey Dresser, MD;  Location: [place] CV LAB;  Service: Cardiovascular;  Laterality: N/A;    MEDICATIONS: No current facility-administered medications for this encounter.    Marland Kitchen acetaminophen (TYLENOL) 500 MG tablet  . amLODipine (NORVASC) 10 MG tablet  . apixaban (ELIQUIS) 5 MG TABS tablet  . Ascorbic Acid (VITAMIN C) 1000 MG tablet  . atorvastatin (LIPITOR) 80 MG tablet  . calcium acetate (PHOSLO) 667 MG capsule  . citalopram (CELEXA) 20 MG tablet  . ezetimibe (ZETIA) 10 MG tablet  . gabapentin (NEURONTIN) 100 MG capsule  . hydrALAZINE (APRESOLINE) 25 MG tablet  . isosorbide mononitrate (IMDUR) 60 MG 24 hr tablet  . losartan (COZAAR) 100 MG tablet  . metoprolol succinate (TOPROL-XL) 25 MG 24 hr tablet  . NOVOLOG 100 UNIT/ML injection  . ofloxacin (OCUFLOX) 0.3 %  ophthalmic solution  . pantoprazole (PROTONIX) 40 MG tablet  . Insulin Human (INSULIN PUMP) SOLN    Myra Gianotti, PA-C Surgical Short Stay/Anesthesiology [facility] Phone [phone] [facility] Phone [phone] 07/26/2019 10:45 AM

## 2019-07-27 ENCOUNTER — Ambulatory Visit (HOSPITAL_COMMUNITY): Payer: Medicare Other | Admitting: Vascular Surgery

## 2019-07-27 ENCOUNTER — Other Ambulatory Visit: Payer: Self-pay

## 2019-07-27 ENCOUNTER — Encounter (HOSPITAL_COMMUNITY)
Admission: RE | Disposition: A | Discharge status: Home / Self Care | Payer: Self-pay | Source: Home / Self Care | Attending: Vascular Surgery

## 2019-07-27 ENCOUNTER — Encounter (HOSPITAL_COMMUNITY): Payer: Self-pay

## 2019-07-27 ENCOUNTER — Ambulatory Visit (HOSPITAL_COMMUNITY)
Admission: RE | Admit: 2019-07-27 | Discharge: 2019-07-27 | Disposition: A | Discharge status: Home / Self Care | Payer: Medicare Other | Attending: Vascular Surgery | Admitting: Vascular Surgery

## 2019-07-27 DIAGNOSIS — L98499 Non-pressure chronic ulcer of skin of other sites with unspecified severity: Secondary | ICD-10-CM | POA: Insufficient documentation

## 2019-07-27 DIAGNOSIS — I2510 Atherosclerotic heart disease of native coronary artery without angina pectoris: Secondary | ICD-10-CM | POA: Diagnosis not present

## 2019-07-27 DIAGNOSIS — Z833 Family history of diabetes mellitus: Secondary | ICD-10-CM | POA: Insufficient documentation

## 2019-07-27 DIAGNOSIS — Z79899 Other long term (current) drug therapy: Secondary | ICD-10-CM | POA: Diagnosis not present

## 2019-07-27 DIAGNOSIS — N186 End stage renal disease: Secondary | ICD-10-CM | POA: Diagnosis not present

## 2019-07-27 DIAGNOSIS — K219 Gastro-esophageal reflux disease without esophagitis: Secondary | ICD-10-CM | POA: Diagnosis not present

## 2019-07-27 DIAGNOSIS — I721 Aneurysm of artery of upper extremity: Secondary | ICD-10-CM | POA: Diagnosis not present

## 2019-07-27 DIAGNOSIS — I252 Old myocardial infarction: Secondary | ICD-10-CM | POA: Insufficient documentation

## 2019-07-27 DIAGNOSIS — Z8249 Family history of ischemic heart disease and other diseases of the circulatory system: Secondary | ICD-10-CM | POA: Insufficient documentation

## 2019-07-27 DIAGNOSIS — I120 Hypertensive chronic kidney disease with stage 5 chronic kidney disease or end stage renal disease: Secondary | ICD-10-CM | POA: Insufficient documentation

## 2019-07-27 DIAGNOSIS — I482 Chronic atrial fibrillation, unspecified: Secondary | ICD-10-CM | POA: Insufficient documentation

## 2019-07-27 DIAGNOSIS — Z992 Dependence on renal dialysis: Secondary | ICD-10-CM | POA: Insufficient documentation

## 2019-07-27 DIAGNOSIS — E785 Hyperlipidemia, unspecified: Secondary | ICD-10-CM | POA: Diagnosis not present

## 2019-07-27 DIAGNOSIS — E1151 Type 2 diabetes mellitus with diabetic peripheral angiopathy without gangrene: Secondary | ICD-10-CM | POA: Diagnosis present

## 2019-07-27 DIAGNOSIS — Z794 Long term (current) use of insulin: Secondary | ICD-10-CM | POA: Diagnosis not present

## 2019-07-27 DIAGNOSIS — Z87891 Personal history of nicotine dependence: Secondary | ICD-10-CM | POA: Diagnosis not present

## 2019-07-27 DIAGNOSIS — E11621 Type 2 diabetes mellitus with foot ulcer: Secondary | ICD-10-CM | POA: Insufficient documentation

## 2019-07-27 DIAGNOSIS — T82898A Other specified complication of vascular prosthetic devices, implants and grafts, initial encounter: Secondary | ICD-10-CM | POA: Diagnosis not present

## 2019-07-27 DIAGNOSIS — Z955 Presence of coronary angioplasty implant and graft: Secondary | ICD-10-CM | POA: Diagnosis not present

## 2019-07-27 DIAGNOSIS — E1122 Type 2 diabetes mellitus with diabetic chronic kidney disease: Secondary | ICD-10-CM | POA: Insufficient documentation

## 2019-07-27 DIAGNOSIS — Z7901 Long term (current) use of anticoagulants: Secondary | ICD-10-CM | POA: Diagnosis not present

## 2019-07-27 HISTORY — DX: Paroxysmal atrial fibrillation: I48.0

## 2019-07-27 HISTORY — PX: REVISION OF ARTERIOVENOUS GORETEX GRAFT: SHX6073

## 2019-07-27 LAB — GLUCOSE, CAPILLARY
Glucose-Capillary: 135 mg/dL — ABNORMAL HIGH (ref 70–99)
Glucose-Capillary: 122 mg/dL — ABNORMAL HIGH (ref 70–99)
Glucose-Capillary: 131 mg/dL — ABNORMAL HIGH (ref 70–99)

## 2019-07-27 LAB — POCT I-STAT, CHEM 8
BUN: 36 mg/dL — ABNORMAL HIGH (ref 8–23)
Calcium, Ion: 1.11 mmol/L — ABNORMAL LOW (ref 1.15–1.40)
Chloride: 99 mmol/L (ref 98–111)
Creatinine, Ser: 7.1 mg/dL — ABNORMAL HIGH (ref 0.61–1.24)
Glucose, Bld: 134 mg/dL — ABNORMAL HIGH (ref 70–99)
HCT: 36 % — ABNORMAL LOW (ref 39.0–52.0)
Hemoglobin: 12.2 g/dL — ABNORMAL LOW (ref 13.0–17.0)
Potassium: 6.2 mmol/L — ABNORMAL HIGH (ref 3.5–5.1)
Sodium: 137 mmol/L (ref 135–145)
TCO2: 27 mmol/L (ref 22–32)

## 2019-07-27 LAB — PROTIME-INR
INR: 1 (ref 0.8–1.2)
Prothrombin Time: 13 seconds (ref 11.4–15.2)

## 2019-07-27 LAB — SURGICAL PCR SCREEN
MRSA, PCR: NEGATIVE
Staphylococcus aureus: NEGATIVE

## 2019-07-27 SURGERY — REVISION OF ARTERIOVENOUS GORETEX GRAFT
Anesthesia: Monitor Anesthesia Care | Site: Arm Upper | Laterality: Left

## 2019-07-27 MED ORDER — CELECOXIB 200 MG PO CAPS
ORAL_CAPSULE | ORAL | Status: AC
  Administered 2019-07-27: 400 mg via ORAL
  Filled 2019-07-27: qty 2

## 2019-07-27 MED ORDER — SODIUM CHLORIDE 0.9 % IV SOLN
INTRAVENOUS | Status: DC
  Administered 2019-07-27: 08:00:00 via INTRAVENOUS

## 2019-07-27 MED ORDER — OXYCODONE HCL 5 MG PO TABS: 5.0000 mg | ORAL_TABLET | Freq: Four times a day (QID) | ORAL | 0 refills | Status: DC | PRN

## 2019-07-27 MED ORDER — PROPOFOL 10 MG/ML IV BOLUS
INTRAVENOUS | Status: DC | PRN
  Administered 2019-07-27: 20 mg via INTRAVENOUS
  Administered 2019-07-27: 15 mg via INTRAVENOUS

## 2019-07-27 MED ORDER — ONDANSETRON HCL 4 MG/2ML IJ SOLN
INTRAMUSCULAR | Status: AC
  Filled 2019-07-27: qty 2

## 2019-07-27 MED ORDER — LIDOCAINE-EPINEPHRINE 0.5 %-1:200000 IJ SOLN
INTRAMUSCULAR | Status: DC | PRN
  Administered 2019-07-27: 3 mL

## 2019-07-27 MED ORDER — FENTANYL CITRATE (PF) 250 MCG/5ML IJ SOLN
INTRAMUSCULAR | Status: AC
  Filled 2019-07-27: qty 5

## 2019-07-27 MED ORDER — PHENYLEPHRINE 40 MCG/ML (10ML) SYRINGE FOR IV PUSH (FOR BLOOD PRESSURE SUPPORT)
PREFILLED_SYRINGE | INTRAVENOUS | Status: AC
  Filled 2019-07-27: qty 10

## 2019-07-27 MED ORDER — CHLORHEXIDINE GLUCONATE 4 % EX LIQD: 60.0000 mL | Freq: Once | CUTANEOUS | Status: DC

## 2019-07-27 MED ORDER — ONDANSETRON HCL 4 MG/2ML IJ SOLN
INTRAMUSCULAR | Status: DC | PRN
  Administered 2019-07-27: 4 mg via INTRAVENOUS

## 2019-07-27 MED ORDER — ACETAMINOPHEN 500 MG PO TABS
1000.0000 mg | ORAL_TABLET | Freq: Once | ORAL | Status: AC
  Administered 2019-07-27: 07:00:00 1000 mg via ORAL

## 2019-07-27 MED ORDER — LIDOCAINE-EPINEPHRINE 0.5 %-1:200000 IJ SOLN
INTRAMUSCULAR | Status: AC
  Filled 2019-07-27: qty 1

## 2019-07-27 MED ORDER — SODIUM CHLORIDE 0.9 % IV SOLN
INTRAVENOUS | Status: DC | PRN
  Administered 2019-07-27: 10:00:00

## 2019-07-27 MED ORDER — FENTANYL CITRATE (PF) 100 MCG/2ML IJ SOLN
INTRAMUSCULAR | Status: DC | PRN
  Administered 2019-07-27: 25 ug via INTRAVENOUS
  Administered 2019-07-27: 50 ug via INTRAVENOUS

## 2019-07-27 MED ORDER — FENTANYL CITRATE (PF) 100 MCG/2ML IJ SOLN: 25.0000 ug | INTRAMUSCULAR | Status: DC | PRN

## 2019-07-27 MED ORDER — ACETAMINOPHEN 500 MG PO TABS
ORAL_TABLET | ORAL | Status: AC
  Administered 2019-07-27: 1000 mg via ORAL
  Filled 2019-07-27: qty 2

## 2019-07-27 MED ORDER — CEFAZOLIN SODIUM-DEXTROSE 2-4 GM/100ML-% IV SOLN
2.0000 g | INTRAVENOUS | Status: AC
  Administered 2019-07-27: 2 g via INTRAVENOUS
  Filled 2019-07-27: qty 100

## 2019-07-27 MED ORDER — 0.9 % SODIUM CHLORIDE (POUR BTL) OPTIME
TOPICAL | Status: DC | PRN
  Administered 2019-07-27: 1000 mL

## 2019-07-27 MED ORDER — PROMETHAZINE HCL 25 MG/ML IJ SOLN: 6.2500 mg | INTRAMUSCULAR | Status: DC | PRN

## 2019-07-27 MED ORDER — SODIUM CHLORIDE 0.9 % IV SOLN
INTRAVENOUS | Status: AC
  Filled 2019-07-27: qty 1.2

## 2019-07-27 MED ORDER — PROPOFOL 10 MG/ML IV BOLUS
INTRAVENOUS | Status: AC
  Filled 2019-07-27: qty 20

## 2019-07-27 MED ORDER — PROPOFOL 500 MG/50ML IV EMUL
INTRAVENOUS | Status: DC | PRN
  Administered 2019-07-27: 100 ug/kg/min via INTRAVENOUS

## 2019-07-27 MED ORDER — CELECOXIB 200 MG PO CAPS
400.0000 mg | ORAL_CAPSULE | Freq: Once | ORAL | Status: AC
  Administered 2019-07-27: 07:00:00 400 mg via ORAL

## 2019-07-27 SURGICAL SUPPLY — 28 items
CANISTER SUCT 3000ML PPV (MISCELLANEOUS) ×2 IMPLANT
CANNULA VESSEL 3MM 2 BLNT TIP (CANNULA) ×2 IMPLANT
CLIP LIGATING EXTRA MED SLVR (CLIP) ×2 IMPLANT
CLIP LIGATING EXTRA SM BLUE (MISCELLANEOUS) ×2 IMPLANT
COVER WAND RF STERILE (DRAPES) ×2 IMPLANT
DECANTER SPIKE VIAL GLASS SM (MISCELLANEOUS) ×2 IMPLANT
DERMABOND ADVANCED (GAUZE/BANDAGES/DRESSINGS) ×1
DERMABOND ADVANCED .7 DNX12 (GAUZE/BANDAGES/DRESSINGS) ×1 IMPLANT
ELECT REM PT RETURN 9FT ADLT (ELECTROSURGICAL) ×2
ELECTRODE REM PT RTRN 9FT ADLT (ELECTROSURGICAL) ×1 IMPLANT
GLOVE SS BIOGEL STRL SZ 7.5 (GLOVE) ×1 IMPLANT
GLOVE SUPERSENSE BIOGEL SZ 7.5 (GLOVE) ×1
GOWN STRL REUS W/ TWL LRG LVL3 (GOWN DISPOSABLE) ×3 IMPLANT
GOWN STRL REUS W/TWL LRG LVL3 (GOWN DISPOSABLE) ×3
KIT BASIN OR (CUSTOM PROCEDURE TRAY) ×2 IMPLANT
KIT TURNOVER KIT B (KITS) ×2 IMPLANT
NEEDLE HYPO 25GX1X1/2 BEV (NEEDLE) ×2 IMPLANT
NS IRRIG 1000ML POUR BTL (IV SOLUTION) ×2 IMPLANT
PACK CV ACCESS (CUSTOM PROCEDURE TRAY) ×2 IMPLANT
PAD ARMBOARD 7.5X6 YLW CONV (MISCELLANEOUS) ×4 IMPLANT
SUT PROLENE 5 0 C 1 24 (SUTURE) ×2 IMPLANT
SUT PROLENE 6 0 CC (SUTURE) ×4 IMPLANT
SUT SILK 2 0 PERMA HAND 18 BK (SUTURE) IMPLANT
SUT VIC AB 3-0 SH 27 (SUTURE) ×2
SUT VIC AB 3-0 SH 27X BRD (SUTURE) ×2 IMPLANT
TOWEL GREEN STERILE (TOWEL DISPOSABLE) ×2 IMPLANT
UNDERPAD 30X30 (UNDERPADS AND DIAPERS) ×2 IMPLANT
WATER STERILE IRR 1000ML POUR (IV SOLUTION) ×2 IMPLANT

## 2019-07-27 NOTE — Interval H&P Note (Signed)
History and Physical Interval Note:  07/27/2019 9:58 AM  Kenneth [name]  has presented today for surgery, with the diagnosis of ANEURYSM OF ARTERIOVENOUS FISTULA LEFT ARM.  The various methods of treatment have been discussed with the patient and family. After consideration of risks, benefits and other options for treatment, the patient has consented to  Procedure(s): PLICATION OF ANEURYSM ARTERIOVENOUS FISTULA  LEFT ARM (Left) as a surgical intervention.  [name] history has been reviewed, patient examined, no change in status, stable for surgery.  I have reviewed the patient's chart and labs.  Questions were answered to the patient's satisfaction.     Curt Jews

## 2019-07-27 NOTE — Op Note (Signed)
    OPERATIVE REPORT  DATE OF SURGERY: 07/27/2019  PATIENT: Kenneth [name], 63 y.o. male MRN: [id number]  DOB: [birth year]  PRE-OPERATIVE DIAGNOSIS: Ulceration over her left upper arm AV fistula  POST-OPERATIVE DIAGNOSIS:  Same  PROCEDURE: Revision of left upper arm AV fistula with resection of nonviable skin and plication of vein  SURGEON:  Curt Jews, M.D.  PHYSICIAN ASSISTANT: Laurence Slate, PA-C  ANESTHESIA: Local with sedation  EBL: per anesthesia record  Total I/O In: 300 [I.V.:300] Out: 10 [Blood:10]  BLOOD ADMINISTERED: none  DRAINS: none  SPECIMEN: none  COUNTS CORRECT:  YES  PATIENT DISPOSITION:  PACU - hemodynamically stable  PROCEDURE DETAILS: Patient was taken the operating placed supine position where the area of the left arm was prepped and draped in usual sterile fashion.  An ellipse of skin was taken at the area of the lower access site in the left upper arm AV fistula.  The patient had an erosion over this area.  The skin was ellipsed and taken down with electrocautery down to isolate the vein.  The cephalic vein fistula was mobilized proximal distal to the area of erosion.  The vein was occluded proximally and distally and the skin was excised.  The nonviable vein overlying this area was also resected for plication of the aneurysm.  The vein was closed with 2 layers of 5-0 Prolene suture.  Clamps were removed and good flow was noted through the fistula.  The skin was closed with 2 layers of 3-0 Vicryl in the subcutaneous and subcuticular level.  Sterile dressing was applied the patient was transferred to the recovery in stable condition   Rosetta Posner, M.D., [facility] 07/27/2019 10:51 AM

## 2019-07-27 NOTE — Anesthesia Postprocedure Evaluation (Signed)
Anesthesia Post Note  Patient: Kenneth [name]  Procedure(s) Performed: PLICATION OF ANEURYSM OF ARTERIOVENOUS FISTULA  LEFT ARM (Left Arm Upper)     Patient location during evaluation: PACU Anesthesia Type: MAC Level of consciousness: awake and alert Pain management: pain level controlled Vital Signs Assessment: post-procedure vital signs reviewed and stable Respiratory status: spontaneous breathing, nonlabored ventilation, respiratory function stable and patient connected to nasal cannula oxygen Cardiovascular status: stable and blood pressure returned to baseline Postop Assessment: no apparent nausea or vomiting Anesthetic complications: no    Last Vitals:  Vitals:   07/27/19 1102 07/27/19 1118  BP: (!) 111/56 (!) 107/54  Pulse: (!) 59 61  Resp: 13 18  Temp: 36.6 C 36.6 C  SpO2: 93% 94%    Last Pain:  Vitals:   07/27/19 1118  TempSrc:   PainSc: 0-No pain                 Kamill Fulbright DANIEL

## 2019-07-27 NOTE — Transfer of Care (Signed)
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  Last Pain:  Vitals:   07/27/19 0722  TempSrc:   PainSc: 0-No pain      Patients Stated Pain Goal: 3 (01/74/94 4967)  Complications: No apparent anesthesia complications

## 2019-07-27 NOTE — Anesthesia Preprocedure Evaluation (Addendum)
Anesthesia Evaluation  Patient identified by MRN, date of birth, ID band Patient awake    Reviewed: Allergy & Precautions, H&P , NPO status , Patient's Chart, lab work & pertinent test results, reviewed documented beta blocker date and time   History of Anesthesia Complications Negative for: history of anesthetic complications  Airway Mallampati: II  TM Distance: >3 FB Neck ROM: Full    Dental no notable dental hx. (+) Dental Advisory Given   Pulmonary former smoker,    Pulmonary exam normal        Cardiovascular hypertension, Pt. on medications and Pt. on home beta blockers + CAD, + Past MI, + CABG, + Peripheral Vascular Disease and +CHF  Normal cardiovascular exam  Narrative Cath  1. Occluded SVG-RCA and SVG-ramus, patent LIMA-LAD.  2. The large ramus was patent with 40-50% mid ramus stenosis (in-stent  restenosis).  3. 80% ostial stenosis very small caliber AV LCx. Not interventional  target.  4. Occluded RCA (known from prior) with collaterals.    No target for intervention. Aggressive medical management of angina.  Less     Echo IMPRESSIONS    1. Mild hypokinesis of the left ventricular, basal inferolateral wall.  2. The left ventricle has low normal systolic function, with an ejection fraction of 50-55%. The cavity size was normal. There is moderate concentric left ventricular hypertrophy. Left ventricular diastolic Doppler parameters are consistent with  pseudonormalization.  3. The right ventricle has normal systolic function. The cavity was normal. There is no increase in right ventricular wall thickness. Right ventricular systolic pressure could not be assessed.  4. Left atrial size was mildly dilated.  5. Right atrial size was mildly dilated.  6. No evidence of mitral valve stenosis.  7. The aortic valve is tricuspid. Mild thickening of the aortic valve. Mild calcification of the aortic valve. No stenosis of  the aortic valve.  8. The aorta is normal in size and structure.  9. The aortic root is normal in size and structure.   Neuro/Psych PSYCHIATRIC DISORDERS Depression negative neurological ROS     GI/Hepatic Neg liver ROS, GERD  ,  Endo/Other  diabetes  Renal/GU CRF and DialysisRenal disease  negative genitourinary   Musculoskeletal   Abdominal   Peds  Hematology negative hematology ROS (+)   Anesthesia Other Findings   Reproductive/Obstetrics negative OB ROS                            Anesthesia Physical  Anesthesia Plan  ASA: III  Anesthesia Plan: MAC   Post-op Pain Management:    Induction: Intravenous  PONV Risk Score and Plan: 2 and Ondansetron and Propofol infusion  Airway Management Planned: Simple Face Mask  Additional Equipment:   Intra-op Plan:   Post-operative Plan:   Informed Consent: I have reviewed the patients History and Physical, chart, labs and discussed the procedure including the risks, benefits and alternatives for the proposed anesthesia with the patient or authorized representative who has indicated his/her understanding and acceptance.     Dental advisory given  Plan Discussed with: CRNA and Anesthesiologist  Anesthesia Plan Comments:       Anesthesia Quick Evaluation

## 2019-07-27 NOTE — Anesthesia Procedure Notes (Signed)
Procedure Name: MAC Date/Time: 07/27/2019 10:05 AM Performed by: Inda Coke, CRNA Pre-anesthesia Checklist: Patient identified, Emergency Drugs available, Suction available, Timeout performed and Patient being monitored Patient Re-evaluated:Patient Re-evaluated prior to induction Oxygen Delivery Method: Simple face mask Induction Type: IV induction Dental Injury: Teeth and Oropharynx as per pre-operative assessment

## 2019-07-28 ENCOUNTER — Encounter (HOSPITAL_COMMUNITY): Payer: Self-pay | Admitting: Vascular Surgery

## 2019-08-12 ENCOUNTER — Other Ambulatory Visit (HOSPITAL_COMMUNITY): Payer: Self-pay | Admitting: Cardiology

## 2019-08-21 NOTE — Progress Notes (Addendum)
POST OPERATIVE OFFICE NOTE    CC:  F/u for surgery  HPI:  This is a 63 y.o. male who is s/p revision of LUA AVF with resection of nonviable skin and plication of vein on 123XX123 by Dr. Donnetta Hutching.  He presents today for follow up.  His incision has healed, but he now has another area with a scab that is concerning for being at risk for bleeding. He states that the fistula is working well.    He did have c/o a sore on his right 2nd toe.   He dialyzes at the [facility] location on M/W/F.   No Known Allergies  Current Outpatient Medications  Medication Sig Dispense Refill  . acetaminophen (TYLENOL) 500 MG tablet Take 1,000 mg by mouth every 6 (six) hours as needed for mild pain or headache.     Marland Kitchen amLODipine (NORVASC) 10 MG tablet Take 10 mg by mouth at bedtime.     Marland Kitchen apixaban (ELIQUIS) 5 MG TABS tablet Take 1 tablet (5 mg total) by mouth 2 (two) times daily. 180 tablet 1  . Ascorbic Acid (VITAMIN C) 1000 MG tablet Take 1,000 mg by mouth 3 (three) times daily.     Marland Kitchen atorvastatin (LIPITOR) 80 MG tablet Take 1 tablet by mouth once daily (Patient taking differently: Take 80 mg by mouth daily with lunch. ) 90 tablet 0  . calcium acetate (PHOSLO) 667 MG capsule Take 667 mg by mouth 3 (three) times daily with meals.     . citalopram (CELEXA) 20 MG tablet Take 20 mg by mouth daily with lunch.   1  . ezetimibe (ZETIA) 10 MG tablet Take 1 tablet by mouth once daily 90 tablet 0  . gabapentin (NEURONTIN) 100 MG capsule Take 100 mg by mouth daily with lunch.   0  . hydrALAZINE (APRESOLINE) 25 MG tablet Take 1 tablet (25 mg total) by mouth every 8 (eight) hours. 90 tablet 6  . Insulin Human (INSULIN PUMP) SOLN Inject into the skin continuous. NOVOLOG    . isosorbide mononitrate (IMDUR) 60 MG 24 hr tablet Take 1 tablet by mouth once daily (Patient taking differently: Take 60 mg by mouth daily. ) 90 tablet 0  . losartan (COZAAR) 100 MG tablet Take 100 mg by mouth at bedtime.     . metoprolol succinate  (TOPROL-XL) 25 MG 24 hr tablet TAKE 1 TABLET BY MOUTH AT BEDTIME (Patient taking differently: Take 25 mg by mouth daily. ) 90 tablet 0  . NOVOLOG 100 UNIT/ML injection Inject 6-10 Units into the skin See admin instructions. Starts with 10 units per day and adds 6 units with each meal via pump    . ofloxacin (OCUFLOX) 0.3 % ophthalmic solution Place 1 drop into both eyes 3 (three) times daily.     Marland Kitchen oxyCODONE (ROXICODONE) 5 MG immediate release tablet Take 1 tablet (5 mg total) by mouth every 6 (six) hours as needed. 8 tablet 0  . pantoprazole (PROTONIX) 40 MG tablet Take 40 mg by mouth daily before lunch.   0   No current facility-administered medications for this visit.      ROS:  See HPI  Physical Exam:  Today's Vitals   08/22/19 1455  BP: (!) 154/66  Pulse: 68  Resp: 18  Temp: 98 F (36.7 C)  SpO2: 97%  Weight: 148 lb (67.1 kg)  Height: 5\' 7"  (1.702 m)   Body mass index is 23.18 kg/m.   Incision:  Healed. Extremities:  The fistula has a  good thrill distally but is somewhat pulsatile proximally.  He does have a scab ~ 0.5-1cm proximal to his last revision.  Left radial pulse is palpable.  Motor and sensory are in tact.  Easily palpable right DP pulse.    Assessment/Plan:  This is a 63 y.o. male who is s/p:  revision of LUA AVF with resection of nonviable skin and plication of vein on 123XX123 by Dr. Donnetta Hutching  -fistula has healed from previous revision a month ago.  However, he has a new scab proximal to previous revision that is concerning for high risk of bleeding.  Pt also examined with Dr. Donnetta Hutching and it is felt that we should proceed with revision of this area.  Should not need a catheter as the fistula can be stuck above and below this area.   -Please do not stick area around proximal scab-may stick above and below this area. -he is on Eliquis and this will need to be held.  -I did advise pt and his wife how to hold pressure should this bleed and to call 911.   -he did have  concerns for a sore on his right 2nd toe.  This is essentially healed and he has an easily palpable right DP pulse.    [name], PA-C [facility] [phone]  Clinic MD:  [place]

## 2019-08-22 ENCOUNTER — Other Ambulatory Visit: Payer: Self-pay

## 2019-08-22 ENCOUNTER — Ambulatory Visit (INDEPENDENT_AMBULATORY_CARE_PROVIDER_SITE_OTHER): Payer: Self-pay | Admitting: Physician Assistant

## 2019-08-22 VITALS — BP 154/66 | HR 68 | Temp 98.0°F | Resp 18 | Ht 67.0 in | Wt 148.0 lb

## 2019-08-22 DIAGNOSIS — Z992 Dependence on renal dialysis: Secondary | ICD-10-CM

## 2019-08-22 DIAGNOSIS — N186 End stage renal disease: Secondary | ICD-10-CM

## 2019-08-27 ENCOUNTER — Emergency Department (HOSPITAL_COMMUNITY): Payer: Medicare Other

## 2019-08-27 ENCOUNTER — Inpatient Hospital Stay (HOSPITAL_COMMUNITY)
Admission: EM | Admit: 2019-08-27 | Discharge: 2019-08-29 | DRG: 091 | Disposition: A | Discharge status: Home / Self Care | Payer: Medicare Other | Attending: Internal Medicine | Admitting: Internal Medicine

## 2019-08-27 ENCOUNTER — Encounter (HOSPITAL_COMMUNITY): Payer: Self-pay | Admitting: Emergency Medicine

## 2019-08-27 ENCOUNTER — Inpatient Hospital Stay (HOSPITAL_COMMUNITY): Payer: Medicare Other

## 2019-08-27 DIAGNOSIS — G8929 Other chronic pain: Secondary | ICD-10-CM | POA: Diagnosis present

## 2019-08-27 DIAGNOSIS — E875 Hyperkalemia: Secondary | ICD-10-CM | POA: Diagnosis present

## 2019-08-27 DIAGNOSIS — I252 Old myocardial infarction: Secondary | ICD-10-CM | POA: Diagnosis not present

## 2019-08-27 DIAGNOSIS — G9341 Metabolic encephalopathy: Secondary | ICD-10-CM | POA: Diagnosis present

## 2019-08-27 DIAGNOSIS — G92 Toxic encephalopathy: Principal | ICD-10-CM | POA: Diagnosis present

## 2019-08-27 DIAGNOSIS — Z800 Family history of malignant neoplasm of digestive organs: Secondary | ICD-10-CM

## 2019-08-27 DIAGNOSIS — M545 Low back pain: Secondary | ICD-10-CM | POA: Diagnosis not present

## 2019-08-27 DIAGNOSIS — I5030 Unspecified diastolic (congestive) heart failure: Secondary | ICD-10-CM | POA: Diagnosis not present

## 2019-08-27 DIAGNOSIS — I639 Cerebral infarction, unspecified: Secondary | ICD-10-CM | POA: Diagnosis not present

## 2019-08-27 DIAGNOSIS — R079 Chest pain, unspecified: Secondary | ICD-10-CM | POA: Diagnosis present

## 2019-08-27 DIAGNOSIS — E785 Hyperlipidemia, unspecified: Secondary | ICD-10-CM | POA: Diagnosis present

## 2019-08-27 DIAGNOSIS — E1022 Type 1 diabetes mellitus with diabetic chronic kidney disease: Secondary | ICD-10-CM | POA: Diagnosis present

## 2019-08-27 DIAGNOSIS — W19XXXA Unspecified fall, initial encounter: Secondary | ICD-10-CM

## 2019-08-27 DIAGNOSIS — N2581 Secondary hyperparathyroidism of renal origin: Secondary | ICD-10-CM | POA: Diagnosis present

## 2019-08-27 DIAGNOSIS — K219 Gastro-esophageal reflux disease without esophagitis: Secondary | ICD-10-CM | POA: Diagnosis present

## 2019-08-27 DIAGNOSIS — E1042 Type 1 diabetes mellitus with diabetic polyneuropathy: Secondary | ICD-10-CM | POA: Diagnosis present

## 2019-08-27 DIAGNOSIS — R4189 Other symptoms and signs involving cognitive functions and awareness: Secondary | ICD-10-CM | POA: Diagnosis present

## 2019-08-27 DIAGNOSIS — I132 Hypertensive heart and chronic kidney disease with heart failure and with stage 5 chronic kidney disease, or end stage renal disease: Secondary | ICD-10-CM | POA: Diagnosis present

## 2019-08-27 DIAGNOSIS — M4856XD Collapsed vertebra, not elsewhere classified, lumbar region, subsequent encounter for fracture with routine healing: Secondary | ICD-10-CM | POA: Diagnosis present

## 2019-08-27 DIAGNOSIS — I6501 Occlusion and stenosis of right vertebral artery: Secondary | ICD-10-CM | POA: Diagnosis present

## 2019-08-27 DIAGNOSIS — Z87891 Personal history of nicotine dependence: Secondary | ICD-10-CM

## 2019-08-27 DIAGNOSIS — I2510 Atherosclerotic heart disease of native coronary artery without angina pectoris: Secondary | ICD-10-CM | POA: Diagnosis present

## 2019-08-27 DIAGNOSIS — I161 Hypertensive emergency: Secondary | ICD-10-CM

## 2019-08-27 DIAGNOSIS — Z794 Long term (current) use of insulin: Secondary | ICD-10-CM

## 2019-08-27 DIAGNOSIS — I119 Hypertensive heart disease without heart failure: Secondary | ICD-10-CM | POA: Diagnosis present

## 2019-08-27 DIAGNOSIS — D649 Anemia, unspecified: Secondary | ICD-10-CM | POA: Diagnosis present

## 2019-08-27 DIAGNOSIS — M549 Dorsalgia, unspecified: Secondary | ICD-10-CM

## 2019-08-27 DIAGNOSIS — E1051 Type 1 diabetes mellitus with diabetic peripheral angiopathy without gangrene: Secondary | ICD-10-CM | POA: Diagnosis present

## 2019-08-27 DIAGNOSIS — Z951 Presence of aortocoronary bypass graft: Secondary | ICD-10-CM

## 2019-08-27 DIAGNOSIS — Z955 Presence of coronary angioplasty implant and graft: Secondary | ICD-10-CM | POA: Diagnosis not present

## 2019-08-27 DIAGNOSIS — I480 Paroxysmal atrial fibrillation: Secondary | ICD-10-CM | POA: Diagnosis present

## 2019-08-27 DIAGNOSIS — R404 Transient alteration of awareness: Secondary | ICD-10-CM

## 2019-08-27 DIAGNOSIS — N186 End stage renal disease: Secondary | ICD-10-CM | POA: Diagnosis present

## 2019-08-27 DIAGNOSIS — Z79899 Other long term (current) drug therapy: Secondary | ICD-10-CM

## 2019-08-27 DIAGNOSIS — I5032 Chronic diastolic (congestive) heart failure: Secondary | ICD-10-CM | POA: Diagnosis present

## 2019-08-27 DIAGNOSIS — E1169 Type 2 diabetes mellitus with other specified complication: Secondary | ICD-10-CM | POA: Diagnosis present

## 2019-08-27 DIAGNOSIS — Z9641 Presence of insulin pump (external) (internal): Secondary | ICD-10-CM | POA: Diagnosis present

## 2019-08-27 DIAGNOSIS — Z20828 Contact with and (suspected) exposure to other viral communicable diseases: Secondary | ICD-10-CM | POA: Diagnosis present

## 2019-08-27 DIAGNOSIS — F329 Major depressive disorder, single episode, unspecified: Secondary | ICD-10-CM | POA: Diagnosis present

## 2019-08-27 DIAGNOSIS — E1122 Type 2 diabetes mellitus with diabetic chronic kidney disease: Secondary | ICD-10-CM | POA: Diagnosis present

## 2019-08-27 DIAGNOSIS — T428X5A Adverse effect of antiparkinsonism drugs and other central muscle-tone depressants, initial encounter: Secondary | ICD-10-CM | POA: Diagnosis present

## 2019-08-27 DIAGNOSIS — L84 Corns and callosities: Secondary | ICD-10-CM | POA: Diagnosis present

## 2019-08-27 DIAGNOSIS — R4182 Altered mental status, unspecified: Secondary | ICD-10-CM

## 2019-08-27 DIAGNOSIS — Z833 Family history of diabetes mellitus: Secondary | ICD-10-CM

## 2019-08-27 DIAGNOSIS — Z992 Dependence on renal dialysis: Secondary | ICD-10-CM | POA: Diagnosis present

## 2019-08-27 DIAGNOSIS — I255 Ischemic cardiomyopathy: Secondary | ICD-10-CM | POA: Diagnosis present

## 2019-08-27 DIAGNOSIS — I25118 Atherosclerotic heart disease of native coronary artery with other forms of angina pectoris: Secondary | ICD-10-CM | POA: Diagnosis present

## 2019-08-27 DIAGNOSIS — I110 Hypertensive heart disease with heart failure: Secondary | ICD-10-CM | POA: Diagnosis not present

## 2019-08-27 DIAGNOSIS — E118 Type 2 diabetes mellitus with unspecified complications: Secondary | ICD-10-CM | POA: Diagnosis not present

## 2019-08-27 DIAGNOSIS — Z7901 Long term (current) use of anticoagulants: Secondary | ICD-10-CM

## 2019-08-27 DIAGNOSIS — Z8249 Family history of ischemic heart disease and other diseases of the circulatory system: Secondary | ICD-10-CM

## 2019-08-27 LAB — COMPREHENSIVE METABOLIC PANEL
ALT: 21 U/L (ref 0–44)
AST: 18 U/L (ref 15–41)
Albumin: 4.2 g/dL (ref 3.5–5.0)
Alkaline Phosphatase: 72 U/L (ref 38–126)
Anion gap: 17 — ABNORMAL HIGH (ref 5–15)
BUN: 47 mg/dL — ABNORMAL HIGH (ref 8–23)
CO2: 24 mmol/L (ref 22–32)
Calcium: 9.9 mg/dL (ref 8.9–10.3)
Chloride: 96 mmol/L — ABNORMAL LOW (ref 98–111)
Creatinine, Ser: 9.18 mg/dL — ABNORMAL HIGH (ref 0.61–1.24)
GFR calc Af Amer: 6 mL/min — ABNORMAL LOW (ref >60)
GFR calc non Af Amer: 6 mL/min — ABNORMAL LOW (ref >60)
Glucose, Bld: 138 mg/dL — ABNORMAL HIGH (ref 70–99)
Potassium: 5.1 mmol/L (ref 3.5–5.1)
Sodium: 137 mmol/L (ref 135–145)
Total Bilirubin: 1.1 mg/dL (ref 0.3–1.2)
Total Protein: 7.7 g/dL (ref 6.5–8.1)

## 2019-08-27 LAB — DIFFERENTIAL
Abs Immature Granulocytes: 0.03 10*3/uL (ref 0.00–0.07)
Basophils Absolute: 0 10*3/uL (ref 0.0–0.1)
Basophils Relative: 0 %
Eosinophils Absolute: 0 10*3/uL (ref 0.0–0.5)
Eosinophils Relative: 0 %
Immature Granulocytes: 0 %
Lymphocytes Relative: 13 %
Lymphs Abs: 1.2 10*3/uL (ref 0.7–4.0)
Monocytes Absolute: 0.9 10*3/uL (ref 0.1–1.0)
Monocytes Relative: 9 %
Neutro Abs: 7.6 10*3/uL (ref 1.7–7.7)
Neutrophils Relative %: 78 %

## 2019-08-27 LAB — ETHANOL: Alcohol, Ethyl (B): <10 mg/dL (ref <10)

## 2019-08-27 LAB — CBC
HCT: 33.1 % — ABNORMAL LOW (ref 39.0–52.0)
Hemoglobin: 10.8 g/dL — ABNORMAL LOW (ref 13.0–17.0)
MCH: 31.7 pg (ref 26.0–34.0)
MCHC: 32.6 g/dL (ref 30.0–36.0)
MCV: 97.1 fL (ref 80.0–100.0)
Platelets: 210 10*3/uL (ref 150–400)
RBC: 3.41 MIL/uL — ABNORMAL LOW (ref 4.22–5.81)
RDW: 15.2 % (ref 11.5–15.5)
WBC: 9.9 10*3/uL (ref 4.0–10.5)
nRBC: 0 % (ref 0.0–0.2)

## 2019-08-27 LAB — I-STAT CHEM 8, ED
BUN: 49 mg/dL — ABNORMAL HIGH (ref 8–23)
Calcium, Ion: 1.13 mmol/L — ABNORMAL LOW (ref 1.15–1.40)
Chloride: 99 mmol/L (ref 98–111)
Creatinine, Ser: 9.5 mg/dL — ABNORMAL HIGH (ref 0.61–1.24)
Glucose, Bld: 133 mg/dL — ABNORMAL HIGH (ref 70–99)
HCT: 35 % — ABNORMAL LOW (ref 39.0–52.0)
Hemoglobin: 11.9 g/dL — ABNORMAL LOW (ref 13.0–17.0)
Potassium: 5.1 mmol/L (ref 3.5–5.1)
Sodium: 136 mmol/L (ref 135–145)
TCO2: 28 mmol/L (ref 22–32)

## 2019-08-27 LAB — PROTIME-INR
INR: 1.4 — ABNORMAL HIGH (ref 0.8–1.2)
Prothrombin Time: 16.6 seconds — ABNORMAL HIGH (ref 11.4–15.2)

## 2019-08-27 LAB — APTT: aPTT: 35 seconds (ref 24–36)

## 2019-08-27 LAB — AMMONIA: Ammonia: 21 umol/L (ref 9–35)

## 2019-08-27 LAB — RESPIRATORY PANEL BY RT PCR (FLU A&B, COVID)
Influenza A by PCR: NEGATIVE
Influenza B by PCR: NEGATIVE
SARS Coronavirus 2 by RT PCR: NEGATIVE

## 2019-08-27 LAB — TROPONIN I (HIGH SENSITIVITY)
Troponin I (High Sensitivity): 74 ng/L — ABNORMAL HIGH (ref <18)
Troponin I (High Sensitivity): 65 ng/L — ABNORMAL HIGH (ref <18)

## 2019-08-27 LAB — CBG MONITORING, ED
Glucose-Capillary: 132 mg/dL — ABNORMAL HIGH (ref 70–99)
Glucose-Capillary: 85 mg/dL (ref 70–99)

## 2019-08-27 LAB — HEMOGLOBIN A1C
Hgb A1c MFr Bld: 6.8 % — ABNORMAL HIGH (ref 4.8–5.6)
Mean Plasma Glucose: 148.46 mg/dL

## 2019-08-27 MED ORDER — INSULIN ASPART 100 UNIT/ML ~~LOC~~ SOLN: 0.0000 [IU] | SUBCUTANEOUS | Status: DC

## 2019-08-27 MED ORDER — IOHEXOL 350 MG/ML SOLN
100.0000 mL | Freq: Once | INTRAVENOUS | Status: AC | PRN
  Administered 2019-08-27: 14:00:00 100 mL via INTRAVENOUS

## 2019-08-27 MED ORDER — HYDRALAZINE HCL 20 MG/ML IJ SOLN
5.0000 mg | Freq: Four times a day (QID) | INTRAMUSCULAR | Status: DC | PRN
  Administered 2019-08-27: 5 mg via INTRAVENOUS
  Filled 2019-08-27: qty 1

## 2019-08-27 MED ORDER — IOHEXOL 350 MG/ML SOLN
100.0000 mL | Freq: Once | INTRAVENOUS | Status: AC | PRN
  Administered 2019-08-27: 100 mL via INTRAVENOUS

## 2019-08-27 MED ORDER — HEPARIN (PORCINE) 25000 UT/250ML-% IV SOLN
900.0000 [IU]/h | INTRAVENOUS | Status: AC
  Administered 2019-08-27: 1050 [IU]/h via INTRAVENOUS
  Filled 2019-08-27: qty 250

## 2019-08-27 NOTE — Progress Notes (Addendum)
ANTICOAGULATION CONSULT NOTE - Initial Consult  Pharmacy Consult for Heparin Indication: atrial fibrillation  No Known Allergies  Patient Measurements: Height: 5\' 7"  (170.2 cm) Weight: 148 lb (67.1 kg) IBW/kg (Calculated) : 66.1 Heparin Dosing Weight: 67.1 kg  Vital Signs: Temp: 97.7 F (36.5 C) (12/13 1317) Temp Source: Temporal (12/13 1317) BP: 182/63 (12/13 2004) Pulse Rate: 67 (12/13 2004)  Labs: Recent Labs    08/27/19 1320 08/27/19 1328 08/27/19 1546  HGB 10.8* 11.9*  --   HCT 33.1* 35.0*  --   PLT 210  --   --   APTT 35  --   --   LABPROT 16.6*  --   --   INR 1.4*  --   --   CREATININE 9.18* 9.50*  --   TROPONINIHS 74*  --  65*    Estimated Creatinine Clearance: 7.5 mL/min (A) (by C-G formula based on SCr of 9.5 mg/dL (H)).   Medical History: Past Medical History:  Diagnosis Date  . Acute pulmonary edema ([place]) 05/31/2016  . Acute respiratory failure with hypoxemia ([place]) 05/31/2016  . CAD (coronary artery disease) 10/30/2016   NSTEMI 9/17 with CABG x 3 (LIMA-LAD, SVG-OM, SVG-RCA).   - NSTEMI 10/18 s/p DES to ostial SVG to  OM  . Depression   . Diabetic foot ulcer ([place]) 04/11/2017  . 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 ESRD (end stage renal disease) on dialysis [facility])    "MWF; [place]" (03/30/2018)  . Gastroesophageal reflux disease 08/18/2016  . HCAP (healthcare-associated pneumonia)   . Hemodialysis status ([place])   . Hyperlipidemia   . Hypertension   . Hypertensive heart disease 09/12/2017  . Ischemic rest pain of lower extremity 02/06/2015  . 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 NSTEMI (non-ST elevated myocardial infarction) ([place])   . PAF (paroxysmal atrial fibrillation) ([place])   . Pleural effusion   . Pronation deformity of both feet 02/27/2015    Medications:  Scheduled:  . insulin aspart  0-6 Units Subcutaneous Q4H    Assessment: Patient is a 38 yom that was unresponsive when presenting to the ED. The  patient is on Apixaban at home for A-fib as he has a CHADSVASc of 3. Patient is still unresponsive and unable to take his Apixaban so pharmacy has been asked to dose Heparin at this time,  Goal of Therapy:  Heparin level 0.3-0.7 units/ml aPTT 66-102 seconds Monitor platelets by anticoagulation protocol: Yes   Plan:  - Unable to determine when last dose of Apixaban was however will assume it was this morning.  - Will start the Heparin drip @ 1050 units/hr now  - Will dose based on aPTT at this time until heparin level and aPTT correlate.  - aPTT and HL in 8 hours - Monitor patient for s/s of bleeding and CBC while on heparin.    Duanne Limerick  PharmD. BCPS  08/27/2019,8:11 PM

## 2019-08-27 NOTE — Significant Event (Signed)
Rapid Response Event Note  Overview:Called d/t pt unresponsiveness (pulse presence confirmed with bedside RN). Pt being admitted for unresponsiveness/AMS-seizure and stroke have been ruled out at this time. Time Called: 2232 Arrival Time: 2235 Event Type: Neurologic  Initial Focused Assessment: Pt laying in bed with eyes opened. Pt keeps saying "no no" when asked questions. Pt speaks [place] but will speak at times in [place]. Pt will say wife's name when asked and say he is having back pain. Pt will not move extremities to painful stimuli, however will move all extremities spontaneously and purposedly at times. Pt has periods where he will have a blank stare on his face and then periods where he is looking around and nodding head when asked questions. Pupils 4 and slugglish, skin warm and dry. Pt has insulin pump, confirmed with bedside RN that no insulin is infusing. T-97.5, HR-70, BP-185-64, RR-14, SpO2-97% on RA, CBG-106.  Interventions: CBG-106 Plan of Care (if not transferred): Pt neuro status, according to previous notes, is the same as it has been since pt arrived in the ED. Frequent neuro checks and vital signs recommended per MD orders. Please call RRT if further assistance needed.  Event Summary:   at  [place]    at          [place], Carren Rang

## 2019-08-27 NOTE — ED Provider Notes (Signed)
[facility] EMERGENCY DEPARTMENT Provider Note   CSN: [id number] Arrival date & time: 08/27/19  1310  An emergency department physician performed an initial assessment on this suspected stroke patient at 1324. LEVEL 5 CAVEAT - ALTERED MENTAL STATUS   History Chief Complaint  Patient presents with  . Code Stroke    Kenneth [name] is a 63 y.o. male.  HPI  63 year old male presents with acute altered mental status.  History is very limited as EMS is no longer present and the patient is altered and does not speak [place].  According to family over the phone when I called, the patient has been having some chest pain since yesterday on and off.  Again had some dizziness some chest pain today.  Perhaps some headache prior to EMS arriving.  When they called EMS, while on the phone, they noticed the patient all of a sudden became unresponsive.  He has been this way since.  No prior episodes like this.  Past Medical History:  Diagnosis Date  . Acute pulmonary edema ([place]) 05/31/2016  . Acute respiratory failure with hypoxemia ([place]) 05/31/2016  . CAD (coronary artery disease) 10/30/2016   NSTEMI 9/17 with CABG x 3 (LIMA-LAD, SVG-OM, SVG-RCA).   - NSTEMI 10/18 s/p DES to ostial SVG to  OM  . Depression   . Diabetic foot ulcer ([place]) 04/11/2017  . 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 ESRD (end stage renal disease) on dialysis [facility])    "MWF; [place]" (03/30/2018)  . Gastroesophageal reflux disease 08/18/2016  . HCAP (healthcare-associated pneumonia)   . Hemodialysis status ([place])   . Hyperlipidemia   . Hypertension   . Hypertensive heart disease 09/12/2017  . Ischemic rest pain of lower extremity 02/06/2015  . 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 NSTEMI (non-ST elevated myocardial infarction) ([place])   . PAF (paroxysmal atrial fibrillation) ([place])   . Pleural effusion   . Pronation deformity of both feet 02/27/2015    Patient Active Problem  List   Diagnosis Date Noted  . Status post coronary artery stent placement   . NSTEMI (non-ST elevated myocardial infarction) ([place]) 03/30/2018  . Hypertensive heart disease 09/12/2017  . Hyperlipidemia 09/12/2017  . Hyperkalemia 07/20/2017  . Chronic diastolic CHF (congestive heart failure) ([place]) 04/14/2017  . Anemia, chronic renal failure 04/11/2017  . Diabetic foot ulcer ([place]) 04/11/2017  . Cellulitis in diabetic foot ([place]) 01/18/2017  . Chest pain 12/16/2016  . Pressure injury of skin 11/03/2016  . Diabetes mellitus with complication ([place]) 123456  . ESRD (end stage renal disease) ([place]) 10/30/2016  . CAD (coronary artery disease) 10/30/2016  . Gastroesophageal reflux disease 08/18/2016  . Non-ST elevation (NSTEMI) myocardial infarction ([place])   . 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[place]) 02/06/2015  . Ischemic rest pain of lower extremity ([place]) 02/06/2015    Past Surgical History:  Procedure Laterality Date  . AV FISTULA PLACEMENT Left 06/22/2016   Procedure: ARTERIOVENOUS (AV) FISTULA CREATION LEFT UPPER ARM;  Surgeon: Rosetta Posner, MD;  Location: [facility];  Service: Vascular;  Laterality: Left;  . CARDIAC CATHETERIZATION N/A 05/31/2016   Procedure: Left Heart Cath and Coronary Angiography;  Surgeon: Jettie Booze, MD;  Location: [facility] CV LAB;  Service: Cardiovascular;  Laterality: N/A;  . CARDIAC CATHETERIZATION N/A 05/31/2016   Procedure: Right Heart Cath;  Surgeon: Jettie Booze, MD;  Location: [facility] CV LAB;  Service: Cardiovascular;  Laterality: N/A;  . CARDIAC CATHETERIZATION N/A 05/31/2016   Procedure: IABP Insertion;  Surgeon: Jettie Booze, MD;  Location: [facility] CV LAB;  Service: Cardiovascular;  Laterality: N/A;  . CORONARY ARTERY BYPASS GRAFT N/A 06/05/2016   Procedure: CORONARY ARTERY BYPASS GRAFTING (CABG) x3 LIMA to LAD -SVG to OM -SVG to RCA;  Surgeon: Ivin Poot, MD;  Location: [facility];  Service: Open Heart Surgery;  Laterality: N/A;  . CORONARY STENT INTERVENTION N/A 07/07/2017   Procedure: CORONARY STENT INTERVENTION;  Surgeon: Wellington Hampshire, MD;  Location: [facility] CV LAB;  Service: Cardiovascular;  Laterality: N/A;  . CORONARY STENT INTERVENTION N/A 03/30/2018   Procedure: CORONARY STENT INTERVENTION;  Surgeon: Martinique, Peter M, MD;  Location: [facility] CV LAB;  Service: Cardiovascular;  Laterality: N/A;  . INSERTION OF DIALYSIS CATHETER N/A 06/05/2016   Procedure: INSERTION OF DIALYSIS/trialysis CATHETER;  Surgeon: Ivin Poot, MD;  Location: [facility];  Service: Vascular;  Laterality: N/A;  . INSERTION OF DIALYSIS CATHETER Right 06/13/2016   Procedure: INSERTION OF DIALYSIS CATHETER RIGHT INTERNAL JUGULAR;  Surgeon: Conrad Chunky, MD;  Location: [facility];  Service: Vascular;  Laterality: Right;  . INTRAOPERATIVE TRANSESOPHAGEAL ECHOCARDIOGRAM N/A 06/05/2016   Procedure: INTRAOPERATIVE TRANSESOPHAGEAL ECHOCARDIOGRAM;  Surgeon: Ivin Poot, MD;  Location: [facility];  Service: Open Heart Surgery;  Laterality: N/A;  . LEFT HEART CATH AND CORS/GRAFTS ANGIOGRAPHY N/A 11/03/2016   Procedure: Left Heart Cath and Cors/Grafts Angiography;  Surgeon: Troy Sine, MD;  Location: [facility] CV LAB;  Service: Cardiovascular;  Laterality: N/A;  . LEFT HEART CATH AND CORS/GRAFTS ANGIOGRAPHY N/A 07/07/2017   Procedure: LEFT HEART CATH AND CORS/GRAFTS ANGIOGRAPHY;  Surgeon: Larey Dresser, MD;  Location: [facility] CV LAB;  Service: Cardiovascular;  Laterality: N/A;  . LEFT HEART CATH AND CORS/GRAFTS ANGIOGRAPHY N/A 03/30/2018   Procedure: LEFT HEART CATH AND CORS/GRAFTS ANGIOGRAPHY;  Surgeon: Martinique, Peter M, MD;  Location: [facility] CV LAB;  Service: Cardiovascular;  Laterality: N/A;  . LEFT HEART CATH AND CORS/GRAFTS ANGIOGRAPHY N/A 04/17/2019   Procedure: LEFT HEART CATH AND CORS/GRAFTS ANGIOGRAPHY;  Surgeon: Larey Dresser, MD;  Location: [facility] CV  LAB;  Service: Cardiovascular;  Laterality: N/A;  . REVISION OF ARTERIOVENOUS GORETEX GRAFT Left [id number]   Procedure: PLICATION OF ANEURYSM OF ARTERIOVENOUS FISTULA  LEFT ARM;  Surgeon: Rosetta Posner, MD;  Location: [facility];  Service: Vascular;  Laterality: Left;       Family History  Problem Relation Age of Onset  . CAD Father   . Colon cancer Father   . Diabetes Brother     Social History   Tobacco Use  . Smoking status: Former Smoker    Packs/day: 1.50    Years: 35.00    Pack years: 52.50    Types: Cigarettes    Quit date: 2014    Years since quitting: 6.9  . Smokeless tobacco: Never Used  . Tobacco comment: "quit e-cigs ~ 2014"  Substance Use Topics  . Alcohol use: Never    Alcohol/week: 0.0 standard drinks  . Drug use: Never    Home Medications Prior to Admission medications   Medication Sig Start Date End Date Taking? Authorizing Provider  acetaminophen (TYLENOL) 500 MG tablet Take 1,000 mg by mouth every 6 (six) hours as needed for mild pain or headache.     [provider]  amLODipine (NORVASC) 10 MG tablet Take 10 mg by mouth at bedtime.  [provider]  apixaban (ELIQUIS) 5 MG TABS tablet Take 1 tablet (5 mg total) by mouth 2 (two) times daily. 04/18/19   Clegg, Amy D, NP  Ascorbic Acid (VITAMIN C) 1000 MG tablet Take 1,000 mg by mouth 3 (three) times daily.     [provider]  atorvastatin (LIPITOR) 80 MG tablet Take 1 tablet by mouth once daily Patient taking differently: Take 80 mg by mouth daily with lunch.  06/28/19   Larey Dresser, MD  calcium acetate (PHOSLO) 667 MG capsule Take 667 mg by mouth 3 (three) times daily with meals.  07/09/17   [provider]  citalopram (CELEXA) 20 MG tablet Take 20 mg by mouth daily with lunch.  02/25/17   [provider]  ezetimibe (ZETIA) 10 MG tablet Take 1 tablet by mouth once daily 08/14/19   Larey Dresser, MD  gabapentin (NEURONTIN) 100 MG capsule Take 100 mg by  mouth daily with lunch.  02/11/17   [provider]  hydrALAZINE (APRESOLINE) 25 MG tablet Take 1 tablet (25 mg total) by mouth every 8 (eight) hours. 04/17/19   Clegg, Amy D, NP  Insulin Human (INSULIN PUMP) SOLN Inject into the skin continuous. NOVOLOG    [provider]  isosorbide mononitrate (IMDUR) 60 MG 24 hr tablet Take 1 tablet by mouth once daily Patient taking differently: Take 60 mg by mouth daily.  05/30/19   Larey Dresser, MD  losartan (COZAAR) 100 MG tablet Take 100 mg by mouth at bedtime.     [provider]  metoprolol succinate (TOPROL-XL) 25 MG 24 hr tablet TAKE 1 TABLET BY MOUTH AT BEDTIME Patient not taking: No sig reported 07/17/19   Larey Dresser, MD  NOVOLOG 100 UNIT/ML injection Inject 6-10 Units into the skin See admin instructions. Starts with 10 units per day and adds 6 units with each meal via pump 01/08/19   [provider]  ofloxacin (OCUFLOX) 0.3 % ophthalmic solution Place 1 drop into both eyes 3 (three) times daily.  02/15/19   [provider]  oxyCODONE (ROXICODONE) 5 MG immediate release tablet Take 1 tablet (5 mg total) by mouth every 6 (six) hours as needed. 07/27/19 07/26/20  Ulyses Amor, PA-C  pantoprazole (PROTONIX) 40 MG tablet Take 40 mg by mouth daily before lunch.  01/07/17   [provider]    Allergies    Patient has no known allergies.  Review of Systems   Review of Systems  Unable to perform ROS: Mental status change    Physical Exam Updated Vital Signs BP (!) 175/62   Pulse 68   Temp 97.7 F (36.5 C) (Temporal)   Resp 12   SpO2 97%   Physical Exam Vitals and nursing note reviewed.  Constitutional:      Appearance: He is well-developed.  HENT:     Head: Normocephalic and atraumatic.     Right Ear: External ear normal.     Left Ear: External ear normal.     Nose: Nose normal.  Eyes:     General:        Right eye: No discharge.        Left eye: No discharge.    Cardiovascular:     Rate and Rhythm: Normal rate and regular rhythm.     Heart sounds: Normal heart sounds.  Pulmonary:     Effort: Pulmonary effort is normal.     Breath sounds: Normal breath sounds.  Abdominal:  Palpations: Abdomen is soft.     Tenderness: There is no abdominal tenderness.  Musculoskeletal:     Cervical back: Neck supple.     Comments: Left AV fistula with thrill  Skin:    General: Skin is warm and dry.  Neurological:     Mental Status: He is unresponsive.     Comments: Patient does not react to painful stimuli in all 4 extremities or to sternal rub. He seems to close eyes when I try to open his eyelids. Does not respond to commands or name.  While in [place], he did cough after an ammonia capsule was placed under his nose for several seconds. Did not sit up. When sat up he had to be held and stared straight ahead and eventually eyes closed again. Never spoke  Psychiatric:        Mood and Affect: Mood is not anxious.     ED Results / Procedures / Treatments   Labs (all labs ordered are listed, but only abnormal results are displayed) Labs Reviewed  PROTIME-INR - Abnormal; Notable for the following components:      Result Value   Prothrombin Time 16.6 (*)    INR 1.4 (*)    All other components within normal limits  CBC - Abnormal; Notable for the following components:   RBC 3.41 (*)    Hemoglobin 10.8 (*)    HCT 33.1 (*)    All other components within normal limits  COMPREHENSIVE METABOLIC PANEL - Abnormal; Notable for the following components:   Chloride 96 (*)    Glucose, Bld 138 (*)    BUN 47 (*)    Creatinine, Ser 9.18 (*)    GFR calc non Af Amer 6 (*)    GFR calc Af Amer 6 (*)    Anion gap 17 (*)    All other components within normal limits  CBG MONITORING, ED - Abnormal; Notable for the following components:   Glucose-Capillary 132 (*)    All other components within normal limits  I-STAT CHEM 8, ED - Abnormal; Notable for the following  components:   BUN 49 (*)    Creatinine, Ser 9.50 (*)    Glucose, Bld 133 (*)    Calcium, Ion 1.13 (*)    Hemoglobin 11.9 (*)    HCT 35.0 (*)    All other components within normal limits  TROPONIN I (HIGH SENSITIVITY) - Abnormal; Notable for the following components:   Troponin I (High Sensitivity) 74 (*)    All other components within normal limits  RESPIRATORY PANEL BY RT PCR (FLU A&B, COVID)  ETHANOL  APTT  DIFFERENTIAL  RAPID URINE DRUG SCREEN, HOSP PERFORMED  URINALYSIS, ROUTINE W REFLEX MICROSCOPIC  TROPONIN I (HIGH SENSITIVITY)    EKG EKG Interpretation  Date/Time:  Sunday August 27 2019 13:12:29 EST Ventricular Rate:  72 PR Interval:    QRS Duration: 116 QT Interval:  444 QTC Calculation: 486 R Axis:   85 Text Interpretation: Sinus rhythm Probable left atrial enlargement LVH with secondary repolarization abnormality Abnormal inferior Q waves Anterior ST elevation, probably due to LVH Borderline prolonged QT interval Confirmed by Sherwood Gambler [phone]) on 08/27/2019 1:23:41 PM   Radiology CT Code Stroke CTA Head W/WO contrast  Result Date: 08/27/2019 CLINICAL DATA:  63 year old male unresponsive. Scattered small age indeterminate infarcts in the left MCA territory on plain CT today. EXAM: CT ANGIOGRAPHY HEAD AND NECK CT PERFUSION BRAIN TECHNIQUE: Multidetector CT imaging of the head and neck was performed  using the standard protocol during bolus administration of intravenous contrast. Multiplanar CT image reconstructions and MIPs were obtained to evaluate the vascular anatomy. Carotid stenosis measurements (when applicable) are obtained utilizing NASCET criteria, using the distal internal carotid diameter as the denominator. Multiphase CT imaging of the brain was performed following IV bolus contrast injection. Subsequent parametric perfusion maps were calculated using RAPID software. CONTRAST:  149mL OMNIPAQUE IOHEXOL 350 MG/ML SOLN COMPARISON:  Plain CT 1328 hours. CTA  head and neck 719 04/01/2018 FINDINGS: CT Brain Perfusion Findings: CBF (<30%) Volume: None Perfusion (Tmax>6.0s) volume: None Mismatch Volume: Not applicable Infarction Location:Not applicable CTA NECK Skeleton: No acute osseous abnormality identified. Prior sternotomy. Upper chest: Apical and upper lobe paraseptal and centrilobular emphysema. No superior mediastinal lymphadenopathy. Other neck: No acute findings. Aortic arch: Calcified aortic atherosclerosis. 3 vessel arch configuration. Right carotid system: No brachiocephalic artery stenosis despite plaque. Normal right CCA origin. Mild plaque proximal to the bifurcation without stenosis. Minimal plaque at the right ICA origin without stenosis. Left carotid system: No left CCA origin stenosis despite plaque. Mild additional plaque proximal to the bifurcation. Calcified plaque at the left ICA origin and bulb with less than 50% stenosis with respect to the distal vessel. Vertebral arteries: No proximal right subclavian artery stenosis despite plaque. 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 Soft and calcified plaque in the proximal left subclavian artery without significant stenosis. Calcified plaque at the left vertebral artery origin with only mild stenosis (series 6, image 122). The left vertebral remains patent to the skull base without stenosis. CTA HEAD Posterior circulation: Diminutive and poorly enhancing right Vertebral artery V4 segment is stable. The left V4 remains patent and supplies the basilar without stenosis. Patent left PICA origin. Patent basilar artery with stable appearance since 2019. Patent SCA origins. Fetal type bilateral PCA origins again noted. Bilateral PCA branches are stable with mild irregularity. Anterior circulation: Both ICA siphons are patent. Both supraclinoid segments are heavily calcified and appear stable since 2019. On the left there is moderate associated stenosis. On the right  there is mild to moderate stenosis. Patent carotid termini. MCA and ACA origins are stable and patent. Diminutive anterior communicating artery. Bilateral ACA branches are within normal limits. Left MCA M1 segment and bifurcation appear stable and patent without stenosis. Right MCA M1 segment and bifurcation appear stable and patent. Visible bilateral MCA branches are stable since 2019. Venous sinuses: Patent. Anatomic variants: Fetal type bilateral PCA origins. Review of the MIP images confirms the above findings IMPRESSION: 1. Negative for emergent large vessel occlusion. Patent basilar artery and unchanged posterior circulation since 2019. 2. CT Perfusion does not detect ischemic penumbra or core infarct. 3. Positive for chronic occlusion of the right vertebral artery, unchanged. 4. Positive also for chronic calcified ICA siphons with moderate left and mild to moderate right supraclinoid stenosis. 5. Aortic Atherosclerosis (ICD10-I70.0) and Emphysema (ICD10-J43.9). These results were communicated to Dr. Cheral Marker at 2:08 pm on 08/27/2019 by text page via the [facility] messaging system. Electronically Signed   By: Genevie Ann M.D.   On: 08/27/2019 14:08   CT Code Stroke CTA Neck W/WO contrast  Result Date: 08/27/2019 CLINICAL DATA:  63 year old male unresponsive. Scattered small age indeterminate infarcts in the left MCA territory on plain CT today. EXAM: CT ANGIOGRAPHY HEAD AND NECK CT PERFUSION BRAIN TECHNIQUE: Multidetector CT imaging of the head and neck was performed using the standard protocol during bolus administration of intravenous contrast. Multiplanar CT image reconstructions and MIPs  were obtained to evaluate the vascular anatomy. Carotid stenosis measurements (when applicable) are obtained utilizing NASCET criteria, using the distal internal carotid diameter as the denominator. Multiphase CT imaging of the brain was performed following IV bolus contrast injection. Subsequent parametric perfusion maps were  calculated using RAPID software. CONTRAST:  158mL OMNIPAQUE IOHEXOL 350 MG/ML SOLN COMPARISON:  Plain CT 1328 hours. CTA head and neck 719 04/01/2018 FINDINGS: CT Brain Perfusion Findings: CBF (<30%) Volume: None Perfusion (Tmax>6.0s) volume: None Mismatch Volume: Not applicable Infarction Location:Not applicable CTA NECK Skeleton: No acute osseous abnormality identified. Prior sternotomy. Upper chest: Apical and upper lobe paraseptal and centrilobular emphysema. No superior mediastinal lymphadenopathy. Other neck: No acute findings. Aortic arch: Calcified aortic atherosclerosis. 3 vessel arch configuration. Right carotid system: No brachiocephalic artery stenosis despite plaque. Normal right CCA origin. Mild plaque proximal to the bifurcation without stenosis. Minimal plaque at the right ICA origin without stenosis. Left carotid system: No left CCA origin stenosis despite plaque. Mild additional plaque proximal to the bifurcation. Calcified plaque at the left ICA origin and bulb with less than 50% stenosis with respect to the distal vessel. Vertebral arteries: No proximal right subclavian artery stenosis despite plaque. 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 Soft and calcified plaque in the proximal left subclavian artery without significant stenosis. Calcified plaque at the left vertebral artery origin with only mild stenosis (series 6, image 122). The left vertebral remains patent to the skull base without stenosis. CTA HEAD Posterior circulation: Diminutive and poorly enhancing right Vertebral artery V4 segment is stable. The left V4 remains patent and supplies the basilar without stenosis. Patent left PICA origin. Patent basilar artery with stable appearance since 2019. Patent SCA origins. Fetal type bilateral PCA origins again noted. Bilateral PCA branches are stable with mild irregularity. Anterior circulation: Both ICA siphons are patent. Both supraclinoid segments  are heavily calcified and appear stable since 2019. On the left there is moderate associated stenosis. On the right there is mild to moderate stenosis. Patent carotid termini. MCA and ACA origins are stable and patent. Diminutive anterior communicating artery. Bilateral ACA branches are within normal limits. Left MCA M1 segment and bifurcation appear stable and patent without stenosis. Right MCA M1 segment and bifurcation appear stable and patent. Visible bilateral MCA branches are stable since 2019. Venous sinuses: Patent. Anatomic variants: Fetal type bilateral PCA origins. Review of the MIP images confirms the above findings IMPRESSION: 1. Negative for emergent large vessel occlusion. Patent basilar artery and unchanged posterior circulation since 2019. 2. CT Perfusion does not detect ischemic penumbra or core infarct. 3. Positive for chronic occlusion of the right vertebral artery, unchanged. 4. Positive also for chronic calcified ICA siphons with moderate left and mild to moderate right supraclinoid stenosis. 5. Aortic Atherosclerosis (ICD10-I70.0) and Emphysema (ICD10-J43.9). These results were communicated to Dr. Cheral Marker at 2:08 pm on 08/27/2019 by text page via the [facility] messaging system. Electronically Signed   By: Genevie Ann M.D.   On: 08/27/2019 14:08   DG Pelvis Portable  Result Date: 08/27/2019 CLINICAL DATA:  Evaluation for metallic devices prior to MRI a EXAM: PORTABLE PELVIS 1-2 VIEWS COMPARISON:  None. FINDINGS: There are metallic devices overlying the right lower quadrant just above the inguinal ligament and overlying the left lower quadrant just above the left iliac crest, both of which are probably extrinsic to the patient. There are safety pins overlying the pelvis as well. IMPRESSION: Metallic foreign bodies overlying the pelvis as described including safety pins  and 2 devices which are likely extrinsic to the patient. Electronically Signed   By: Lorriane Shire M.D.   On: 08/27/2019 14:54    CT Code Stroke Cerebral Perfusion with contrast  Result Date: 08/27/2019 CLINICAL DATA:  63 year old male unresponsive. Scattered small age indeterminate infarcts in the left MCA territory on plain CT today. EXAM: CT ANGIOGRAPHY HEAD AND NECK CT PERFUSION BRAIN TECHNIQUE: Multidetector CT imaging of the head and neck was performed using the standard protocol during bolus administration of intravenous contrast. Multiplanar CT image reconstructions and MIPs were obtained to evaluate the vascular anatomy. Carotid stenosis measurements (when applicable) are obtained utilizing NASCET criteria, using the distal internal carotid diameter as the denominator. Multiphase CT imaging of the brain was performed following IV bolus contrast injection. Subsequent parametric perfusion maps were calculated using RAPID software. CONTRAST:  135mL OMNIPAQUE IOHEXOL 350 MG/ML SOLN COMPARISON:  Plain CT 1328 hours. CTA head and neck 719 04/01/2018 FINDINGS: CT Brain Perfusion Findings: CBF (<30%) Volume: None Perfusion (Tmax>6.0s) volume: None Mismatch Volume: Not applicable Infarction Location:Not applicable CTA NECK Skeleton: No acute osseous abnormality identified. Prior sternotomy. Upper chest: Apical and upper lobe paraseptal and centrilobular emphysema. No superior mediastinal lymphadenopathy. Other neck: No acute findings. Aortic arch: Calcified aortic atherosclerosis. 3 vessel arch configuration. Right carotid system: No brachiocephalic artery stenosis despite plaque. Normal right CCA origin. Mild plaque proximal to the bifurcation without stenosis. Minimal plaque at the right ICA origin without stenosis. Left carotid system: No left CCA origin stenosis despite plaque. Mild additional plaque proximal to the bifurcation. Calcified plaque at the left ICA origin and bulb with less than 50% stenosis with respect to the distal vessel. Vertebral arteries: No proximal right subclavian artery stenosis despite plaque. Chronically  occluded cervical right vertebral artery appears unchanged since 2019. Soft and calcified plaque in the proximal left subclavian artery without significant stenosis. Calcified plaque at the left vertebral artery origin with only mild stenosis (series 6, image 122). The left vertebral remains patent to the skull base without stenosis. CTA HEAD Posterior circulation: Diminutive and poorly enhancing right Vertebral artery V4 segment is stable. The left V4 remains patent and supplies the basilar without stenosis. Patent left PICA origin. Patent basilar artery with stable appearance since 2019. Patent SCA origins. Fetal type bilateral PCA origins again noted. Bilateral PCA branches are stable with mild irregularity. Anterior circulation: Both ICA siphons are patent. Both supraclinoid segments are heavily calcified and appear stable since 2019. On the left there is moderate associated stenosis. On the right there is mild to moderate stenosis. Patent carotid termini. MCA and ACA origins are stable and patent. Diminutive anterior communicating artery. Bilateral ACA branches are within normal limits. Left MCA M1 segment and bifurcation appear stable and patent without stenosis. Right MCA M1 segment and bifurcation appear stable and patent. Visible bilateral MCA branches are stable since 2019. Venous sinuses: Patent. Anatomic variants: Fetal type bilateral PCA origins. Review of the MIP images confirms the above findings IMPRESSION: 1. Negative for emergent large vessel occlusion. Patent basilar artery and unchanged posterior circulation since 2019. 2. CT Perfusion does not detect ischemic penumbra or core infarct. 3. Positive for chronic occlusion of the right vertebral artery, unchanged. 4. Positive also for chronic calcified ICA siphons with moderate left and mild to moderate right supraclinoid stenosis. 5. Aortic Atherosclerosis (ICD10-I70.0) and Emphysema (ICD10-J43.9). These results were communicated to Dr. Cheral Marker at  2:08 pm on 08/27/2019 by text page via the [facility] messaging system. Electronically Signed   By:  Genevie Ann M.D.   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 Both lungs are clear. The visualized skeletal structures are unremarkable. IMPRESSION: Cardiomegaly without acute abnormality of the lungs in AP portable projection Electronically Signed   By: Eddie Candle M.D.   On: 08/27/2019 14:50   DG Abd Portable 1 View  Result Date: 08/27/2019 CLINICAL DATA:  Checking for devices prior to MRI. EXAM: PORTABLE ABDOMEN - 1 VIEW COMPARISON:  Abdominal radiograph dated 06/08/2016 FINDINGS: There is a complex metallic device overlying the right lower quadrant which is likely extrinsic to the patient. There is a circular and a metallic device overlying the left lower quadrant which is also likely extrinsic to the safety pins also overlie the right and left sides of the lower abdomen. Bowel gas pattern is normal. No acute bone abnormality. IMPRESSION: 1. Benign-appearing abdomen. 2. Metallic foreign bodies overlying the right lower quadrant and left lower quadrant. These are both likely extrinsic to the patient. Safety pins are also present. Electronically Signed   By: Lorriane Shire M.D.   On: 08/27/2019 14:51   CT HEAD CODE STROKE WO CONTRAST  Result Date: 08/27/2019 CLINICAL DATA:  Code stroke. 63 year old male is unresponsive. Weakness. EXAM: CT HEAD WITHOUT CONTRAST TECHNIQUE: Contiguous axial images were obtained from the base of the skull through the vertex without intravenous contrast. COMPARISON:  Head CT 04/01/2018. Brain MRI 04/01/2018. FINDINGS: Brain: Stable cerebral volume since 2019. No midline shift, ventriculomegaly, mass effect, evidence of mass lesion, intracranial hemorrhage or evidence of cortically based acute infarction.  Patchy periventricular white matter hypodensity appears stable. Subtle new cortical hypodensity in the anterior and posterior left MCA territory seen on series 3, image 22 and sagittal image 42. There is also a small new round hypodensity in the left posterior lentiform on image 18. But no other CT changes of acute ischemia identified. Vascular: Calcified atherosclerosis at the skull base. No suspicious intracranial vascular hyperdensity. Skull: No acute osseous abnormality identified. Sinuses/Orbits: Visualized paranasal sinuses and mastoids are stable and well pneumatized. Other: No acute orbit or scalp soft tissue findings. There is some calcified scalp vessel atherosclerosis. ASPECTS [facility] Stroke Program Early CT Score) Total score (0-10 with 10 being normal): 7; given the scattered distribution of the 3 small hypodense areas in the left MCA territory. IMPRESSION: 1. 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 No associated hemorrhage or mass effect. 2. Elsewhere stable non contrast CT appearance of the brain since 2019. 3. Given the distribution of lesions in #1 ASPECTS would be 7. 4. These results were communicated to Dr. Cheral Marker at 1:42 pm on 08/27/2019 by text page via the [facility] messaging system. Electronically Signed   By: Genevie Ann M.D.   On: 08/27/2019 13:42    Procedures .Critical Care Performed by: Sherwood Gambler, MD Authorized by: Sherwood Gambler, MD   Critical care provider statement:    Critical care time (minutes):  35   Critical care time was exclusive of:  Separately billable procedures and treating other patients   Critical care was necessary to treat or prevent imminent or life-threatening deterioration of the following conditions:  CNS failure or compromise   Critical care was time spent personally by me on the following activities:  Discussions with  consultants, evaluation of  patient's response to treatment, examination of patient, ordering and performing treatments and interventions, ordering and review of laboratory studies, ordering and review of radiographic studies, pulse oximetry, re-evaluation of patient's condition, obtaining history from patient or surrogate and review of old charts   (including critical care time)  Medications Ordered in ED Medications  iohexol (OMNIPAQUE) 350 MG/ML injection 100 mL (100 mLs Intravenous Contrast Given 08/27/19 1340)    ED Course  I have reviewed the triage vital signs and the nursing notes.  Pertinent labs & imaging results that were available during my care of the patient were reviewed by me and considered in my medical decision making (see chart for details).    MDM Rules/Calculators/A&P     CHA2DS2/VAS Stroke Risk Points  Current as of 8 minutes ago     4 >= 2 Points: High Risk  1 - 1.99 Points: Medium Risk  0 Points: Low Risk    [name] score has not changed in the past year.: No Change     Details    This score determines the patient's risk of having a stroke if the  patient has atrial fibrillation.       Points Metrics  1 Has Congestive Heart Failure:  Yes    Current as of 8 minutes ago  1 Has Vascular Disease:  Yes    Current as of 8 minutes ago  1 Has Hypertension:  Yes    Current as of 8 minutes ago  0 Age:  70    Current as of 8 minutes ago  1 Has Diabetes:  Yes    Current as of 8 minutes ago  0 Had Stroke:  No  Had TIA:  No  Had thromboembolism:  No    Current as of 8 minutes ago  0 Male:  No    Current as of 8 minutes ago                         Patient is unresponsive, but protecting his airway. He needs to be closely watched for now, but I do not think he needs intubation. His exam is odd, no movement to pain, but resists eyes opening and responded to ammonia. D/w Dr. Cheral Marker, will get emergent MRI and EEG to help eval. No meningismus or fever. Care to Dr. Tamera Punt.  Kenneth [name] was evaluated in Emergency Department on 08/27/2019 for the symptoms described in the history of present illness. He was evaluated in the context of the global COVID-19 pandemic, which necessitated consideration that the patient might be at risk for infection with the SARS-CoV-2 virus that causes COVID-19. Institutional protocols and algorithms that pertain to the evaluation of patients at risk for COVID-19 are in a state of rapid change based on information released by regulatory bodies including the CDC and federal and state organizations. These policies and algorithms were followed during the patient's care in the ED.  Final Clinical Impression(s) / ED Diagnoses Final diagnoses:  None    Rx / DC Orders ED Discharge Orders    None       Sherwood Gambler, MD 08/27/19 1541

## 2019-08-27 NOTE — Procedures (Signed)
Patient Name: Kenneth [name]  MRN: [id number]  Epilepsy Attending: Lora Havens  Referring Physician/Provider: Laurey Morale, NP Date: 08/27/2019 Duration: 30.91mins  Patient history: 63yo F with sudden onset of ams. EEG to evaluate for seizure.  Level of alertness: awake, asleep  AEDs during EEG study: None  Technical aspects: This EEG study was done with scalp electrodes positioned according to the 10-20 International system of electrode placement. Electrical activity was acquired at a sampling rate of 500Hz  and reviewed with a high frequency filter of 70Hz  and a low frequency filter of 1Hz . EEG data were recorded continuously and digitally stored.   DESCRIPTION: During awake state, the posterior dominant rhythm consists of 8-9 Hz activity of moderate voltage (25-35 uV) seen predominantly in posterior head regions, symmetric and reactive to eye opening and eye closing. Sleep was characterized by sleep spindles (12-14Hz ), maximal frontocentral, and intermittent generalized 2-3Hz  delta slowing.       Hyperventilation and photic stimulation were not performed.  IMPRESSION: This study is within normal limits. No seizures or epileptiform discharges were seen throughout the recording.  Jose Alleyne Barbra Sarks

## 2019-08-27 NOTE — ED Notes (Signed)
Found pt sitting up in bed.  Pt was able to follow directions to lay back.  Although he speaks [place] he was able to ask why he was here.  RN sent for interpretor.  Pt stated he did not want to be here and asked for his wife.  RN will call when interpretor is free, currently being used by another.

## 2019-08-27 NOTE — ED Notes (Signed)
Patient transported to MRI 

## 2019-08-27 NOTE — ED Provider Notes (Signed)
Care was taken over from Dr. Regenia Skeeter.  Patient had come in being unresponsive.  Brainstem stroke was considered although patient had a somewhat inconsistent exam and that he would close both of his eyes while they were trying to be opened and he had a partial response to ammonia reportedly.  Dr. Lovie Chol with neurology had wanted to get a stat MRI and EEG.  The MRI shows no acute abnormality.  The EEG result is still pending.  Dr. Evette Doffing feels it would be prudent to admit the patient to the hospital service to a stepdown unit for observation.  I spoke with the hospitalist who will admit the patient for further treatment.   Malvin Johns, MD 08/27/19 1736

## 2019-08-27 NOTE — H&P (Addendum)
[facility] History and Physical    Kenneth [name], is a 63 y.o. male  MRN: [id number]   DOB - 1956-04-27  Admit Date - 08/27/2019  Outpatient Primary MD for the patient is Kenneth Holstein, MD  Outpatient Specialists:    Patient coming from: home  Chief Complaint: unresponsive  HPI: Kenneth [name] is a 63 y.o. male with medical history significant for CAD status post CABG (2017) history of severe in-stent restenosis with DES x2 (03/2017), with chronic pattern of atypical chest pain last admitted on on 04/13/2019 severe chest pain with mildly elevated troponin, chronic CHF, ESRD on HD Monday Wednesday Friday, HTN, type 2 diabetes who presents on 08/27/2019 from home after witnessed unresponsive episode around 1230 after complaining of chest pain early this morning.  Per chart review report patient had chest pain which prompted him to call EMS before this episode.  Spoke with wife over phone with help of [name]: The patient woke up this morning complaining of cold sweats. She checked his blood glucose which was 150 and his BP was 185/60. He continued to complain of dizziness, unsteadiness with ambulation and loss of strength. He eventually fell while walking and landed near the bathtub.  When she brought him downstairs, he started complaining of chest pain and generalized body aches with no clear radiation or alleviating or exacerbating factors. She denied any noted fevers or chills.  He continued to have chest pain until EMS arrived and obtained a blood pressure of 208/95.    He was seen by his PCP on 12/10 for back pain and prescribed dose pack of methyprednisolone and baclofen with noted improvement in back pain per wife.  Hypertension.  She states that he is adherent to his home regimen of blood pressure medications but chart review shows history of poor adherence  CAD s/p status post CABG with NSTEMI 06/2017, hx of severe instent restenosis 03/2018.     History of chronic  atypical chest pain episodes,  Most recent admission 04/13/2019 with chest pain with mildly elevated troponin (166--153--139).  Cath at that time showed no interventional target with 40 to 50% in-stent restenosis.  At that time pain was treated medically with increase of Imdur to 90 mg daily   ED COURSE: Upon arrival to ED patient was lethargic and not responsive to verbal or painful stimuli initiating code stroke.  Neurology was consulted, patient had inconsistent physical exam unclear if degree of embellishment versus malingering.  Patient underwent stat CT head, CT head neck, MRI brain all negative for acute CVA.  Neurology recommended EEG and close monitoring the stepdown unit given mental status of unclear etiology.  Blood pressure elevated at 186/75, patient afebrile.  Initial high-sensitivity troponin of 74 down trended within 2 hours to 65.  Patient was Covid negative. Lab work notable for INR 1.4, unremarkable changes from CBC or CMP otherwise. Chest x-ray with cardiomegaly without acute findings.  X-ray of abdomen and pelvis with no acute abnormalities  TRH service was called for further management and admission.    Review of Systems:   In addition to the HPI above, Constitutional:  No weight loss, night sweats, Fevers, chills, fatigue.  HEENT:  No headaches, Difficulty swallowing,Tooth/dental problems,Sore throat,  No sneezing, itching, ear ache, nasal congestion, post nasal drip,  Cardio-vascular:  No  Orthopnea, PND, swelling in lower extremities, anasarca, dizziness, palpitations  GI:  No heartburn, indigestion, abdominal pain, nausea, vomiting, diarrhea, change in bowel habits, loss of appetite  Resp:  No shortness of breath with exertion or at rest. No excess mucus, no productive cough, No non-productive cough, No coughing up of blood.No change in color of mucus.No wheezing.No chest wall deformity  Skin:  no rash or lesions.  GU:  no dysuria, change in color of urine, no  urgency or frequency. No flank pain.  Musculoskeletal:  No joint pain or swelling. No decreased range of motion. No back pain.  Psych:  No change in mood or affect. No depression or anxiety. No memory loss.   A full 10 point Review of Systems was done, except as stated above, all other Review of Systems were negative.  Past Medical History:  Diagnosis Date  . Acute pulmonary edema ([place]) 05/31/2016  . Acute respiratory failure with hypoxemia ([place]) 05/31/2016  . CAD (coronary artery disease) 10/30/2016   NSTEMI 9/17 with CABG x 3 (LIMA-LAD, SVG-OM, SVG-RCA).   - NSTEMI 10/18 s/p DES to ostial SVG to  OM  . Depression   . Diabetic foot ulcer ([place]) 04/11/2017  . 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 ESRD (end stage renal disease) on dialysis [facility])    "MWF; [place]" (03/30/2018)  . Gastroesophageal reflux disease 08/18/2016  . HCAP (healthcare-associated pneumonia)   . Hemodialysis status ([place])   . Hyperlipidemia   . Hypertension   . Hypertensive heart disease 09/12/2017  . Ischemic rest pain of lower extremity 02/06/2015  . 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 NSTEMI (non-ST elevated myocardial infarction) ([place])   . PAF (paroxysmal atrial fibrillation) ([place])   . Pleural effusion   . Pronation deformity of both feet 02/27/2015   Past Surgical History:  Procedure Laterality Date  . AV FISTULA PLACEMENT Left 06/22/2016   Procedure: ARTERIOVENOUS (AV) FISTULA CREATION LEFT UPPER ARM;  Surgeon: Rosetta Posner, MD;  Location: [place];  Service: Vascular;  Laterality: Left;  . CARDIAC CATHETERIZATION N/A 05/31/2016   Procedure: Left Heart Cath and Coronary Angiography;  Surgeon: Jettie Booze, MD;  Location: [place] [facility];  Service: Cardiovascular;  Laterality: N/A;  . CARDIAC CATHETERIZATION N/A 05/31/2016   Procedure: Right Heart Cath;  Surgeon: Jettie Booze, MD;  Location: [place] [facility];  Service: Cardiovascular;  Laterality: N/A;  .  CARDIAC CATHETERIZATION N/A 05/31/2016   Procedure: IABP Insertion;  Surgeon: Jettie Booze, MD;  Location: [place] [facility];  Service: Cardiovascular;  Laterality: N/A;  . CORONARY ARTERY BYPASS GRAFT N/A 06/05/2016   Procedure: CORONARY ARTERY BYPASS GRAFTING (CABG) x3 LIMA to LAD -SVG to OM -SVG to RCA;  Surgeon: Ivin Poot, MD;  Location: [place];  Service: Open Heart Surgery;  Laterality: N/A;  . CORONARY STENT INTERVENTION N/A 07/07/2017   Procedure: CORONARY STENT INTERVENTION;  Surgeon: Wellington Hampshire, MD;  Location: [place] [facility];  Service: Cardiovascular;  Laterality: N/A;  . CORONARY STENT INTERVENTION N/A 03/30/2018   Procedure: CORONARY STENT INTERVENTION;  Surgeon: Martinique, Peter M, MD;  Location: [place] [facility];  Service: Cardiovascular;  Laterality: N/A;  . INSERTION OF DIALYSIS CATHETER N/A 06/05/2016   Procedure: INSERTION OF DIALYSIS/trialysis CATHETER;  Surgeon: Ivin Poot, MD;  Location: [place];  Service: Vascular;  Laterality: N/A;  . INSERTION OF DIALYSIS CATHETER Right 06/13/2016   Procedure: INSERTION OF DIALYSIS CATHETER RIGHT INTERNAL JUGULAR;  Surgeon: Conrad Overly, MD;  Location: [place];  Service: Vascular;  Laterality: Right;  . INTRAOPERATIVE TRANSESOPHAGEAL ECHOCARDIOGRAM N/A 06/05/2016   Procedure:  INTRAOPERATIVE TRANSESOPHAGEAL ECHOCARDIOGRAM;  Surgeon: Ivin Poot, MD;  Location: [place];  Service: Open Heart Surgery;  Laterality: N/A;  . LEFT HEART CATH AND CORS/GRAFTS ANGIOGRAPHY N/A 11/03/2016   Procedure: Left Heart Cath and Cors/Grafts Angiography;  Surgeon: Troy Sine, MD;  Location: [place] [facility];  Service: Cardiovascular;  Laterality: N/A;  . LEFT HEART CATH AND CORS/GRAFTS ANGIOGRAPHY N/A 07/07/2017   Procedure: LEFT HEART CATH AND CORS/GRAFTS ANGIOGRAPHY;  Surgeon: Larey Dresser, MD;  Location: [place] [facility];  Service: Cardiovascular;  Laterality: N/A;  . LEFT HEART CATH AND CORS/GRAFTS ANGIOGRAPHY N/A 03/30/2018    Procedure: LEFT HEART CATH AND CORS/GRAFTS ANGIOGRAPHY;  Surgeon: Martinique, Peter M, MD;  Location: [place] [facility];  Service: Cardiovascular;  Laterality: N/A;  . LEFT HEART CATH AND CORS/GRAFTS ANGIOGRAPHY N/A 04/17/2019   Procedure: LEFT HEART CATH AND CORS/GRAFTS ANGIOGRAPHY;  Surgeon: Larey Dresser, MD;  Location: [place] [facility];  Service: Cardiovascular;  Laterality: N/A;  . REVISION OF ARTERIOVENOUS GORETEX GRAFT Left [id number]   Procedure: PLICATION OF ANEURYSM OF ARTERIOVENOUS FISTULA  LEFT ARM;  Surgeon: Rosetta Posner, MD;  [facility];  Service: Vascular;  Laterality: Left;   Social History:  reports that he quit smoking about 6 years ago. His smoking use included cigarettes. He has a 52.50 pack-year smoking history. He has never used smokeless tobacco. He reports that he does not drink alcohol or use drugs.  No Known Allergies  Family History  Problem Relation Age of Onset  . CAD Father   . Colon cancer Father   . Diabetes Brother       Prior to Admission medications   Medication Sig Start Date End Date Taking? Authorizing Provider  acetaminophen (TYLENOL) 500 MG tablet Take 1,000 mg by mouth every 6 (six) hours as needed for mild pain or headache.     [provider]  amLODipine (NORVASC) 10 MG tablet Take 10 mg by mouth at bedtime.     [provider]  apixaban (ELIQUIS) 5 MG TABS tablet Take 1 tablet (5 mg total) by mouth 2 (two) times daily. 04/18/19   Clegg, Amy D, NP  Ascorbic Acid (VITAMIN C) 1000 MG tablet Take 1,000 mg by mouth 3 (three) times daily.     [provider]  atorvastatin (LIPITOR) 80 MG tablet Take 1 tablet by mouth once daily Patient taking differently: Take 80 mg by mouth daily with lunch.  06/28/19   Larey Dresser, MD  calcium acetate (PHOSLO) 667 MG capsule Take 667 mg by mouth 3 (three) times daily with meals.  07/09/17   [provider]  citalopram (CELEXA) 20 MG tablet Take 20 mg by mouth daily with  lunch.  02/25/17   [provider]  ezetimibe (ZETIA) 10 MG tablet Take 1 tablet by mouth once daily 08/14/19   Larey Dresser, MD  gabapentin (NEURONTIN) 100 MG capsule Take 100 mg by mouth daily with lunch.  02/11/17   [provider]  hydrALAZINE (APRESOLINE) 25 MG tablet Take 1 tablet (25 mg total) by mouth every 8 (eight) hours. 04/17/19   Clegg, Amy D, NP  Insulin Human (INSULIN PUMP) SOLN Inject into the skin continuous. NOVOLOG    [provider]  isosorbide mononitrate (IMDUR) 60 MG 24 hr tablet Take 1 tablet by mouth once daily Patient taking differently: Take 60 mg by mouth daily.  05/30/19   Larey Dresser, MD  losartan (COZAAR) 100 MG tablet  Take 100 mg by mouth at bedtime.     [provider]  metoprolol succinate (TOPROL-XL) 25 MG 24 hr tablet TAKE 1 TABLET BY MOUTH AT BEDTIME Patient not taking: No sig reported 07/17/19   Larey Dresser, MD  NOVOLOG 100 UNIT/ML injection Inject 6-10 Units into the skin See admin instructions. Starts with 10 units per day and adds 6 units with each meal via pump 01/08/19   [provider]  ofloxacin (OCUFLOX) 0.3 % ophthalmic solution Place 1 drop into both eyes 3 (three) times daily.  02/15/19   [provider]  oxyCODONE (ROXICODONE) 5 MG immediate release tablet Take 1 tablet (5 mg total) by mouth every 6 (six) hours as needed. 07/27/19 07/26/20  Ulyses Amor, PA-C  pantoprazole (PROTONIX) 40 MG tablet Take 40 mg by mouth daily before lunch.  01/07/17   [provider]   Physical Exam: Vitals:   08/27/19 1815 08/27/19 1830 08/27/19 1845 08/27/19 1900  BP: (!) 181/70 (!) 162/101 (!) 173/69 (!) 173/59  Pulse: 64 63 64 64  Resp: 13 12 14 12   Temp:      TempSrc:      SpO2: 99% 98% 99% 99%    Wt Readings from Last 3 Encounters:  08/22/19 67.1 kg  07/27/19 67.2 kg  07/11/19 67.1 kg    Constitutional lying in bed, no distress Eyes: EOMI, anicteric, normal conjunctivae ENMT:  Oropharynx with moist mucous membranes,  Cardiovascular: RRR no MRGs, no chest wall tenderness, with no peripheral edema Respiratory: Normal respiratory effort on room air, clear breath sounds on anterior chest Abdomen: Soft, minimal guarding with deep palpation, decreased bowel sounds, no rebound tenderness Musculoskeletal: no clubbing / cyanosis. No joint deformity upper and lower extremities. Good ROM, no contractures. Normal muscle tone. Skin: No rash ulcers, or lesions. Without skin tenting  Neurologic: Opens eyes to voice and tracks.  Not following any commands (exam limited by language barrier). Psychiatric:Appropriate affect, and mood. Mental status AAOx3          Labs on Admission:  Basic Metabolic Panel: Recent Labs  Lab 08/27/19 1320 08/27/19 1328  NA 137 136  K 5.1 5.1  CL 96* 99  CO2 24  --   GLUCOSE 138* 133*  BUN 47* 49*  CREATININE 9.18* 9.50*  CALCIUM 9.9  --    Liver Function Tests: Recent Labs  Lab 08/27/19 1320  AST 18  ALT 21  ALKPHOS 72  BILITOT 1.1  PROT 7.7  ALBUMIN 4.2   No results for input(s): LIPASE, AMYLASE in the last 168 hours. No results for input(s): AMMONIA in the last 168 hours. CBC: Recent Labs  Lab 08/27/19 1320 08/27/19 1328  WBC 9.9  --   NEUTROABS 7.6  --   HGB 10.8* 11.9*  HCT 33.1* 35.0*  MCV 97.1  --   PLT 210  --    Cardiac Enzymes: No results for input(s): CKTOTAL, CKMB, CKMBINDEX, TROPONINI in the last 168 hours.  BNP (last 3 results) No results for input(s): BNP in the last 8760 hours.  ProBNP (last 3 results) No results for input(s): PROBNP in the last 8760 hours.  CBG: Recent Labs  Lab 08/27/19 1318  GLUCAP 132*    Radiological Exams on Admission: CT Code Stroke CTA Head W/WO contrast  Result Date: 08/27/2019 CLINICAL DATA:  63 year old male unresponsive. Scattered small age indeterminate infarcts in the left MCA territory on plain CT today. EXAM: CT ANGIOGRAPHY HEAD AND NECK CT PERFUSION BRAIN  TECHNIQUE: Multidetector CT imaging of the head and neck was performed using the standard protocol during bolus administration of intravenous contrast. Multiplanar CT image reconstructions and MIPs were obtained to evaluate the vascular anatomy. Carotid stenosis measurements (when applicable) are obtained utilizing NASCET criteria, using the distal internal carotid diameter as the denominator. Multiphase CT imaging of the brain was performed following IV bolus contrast injection. Subsequent parametric perfusion maps were calculated using RAPID software. CONTRAST:  124mL OMNIPAQUE IOHEXOL 350 MG/ML SOLN COMPARISON:  Plain CT 1328 hours. CTA head and neck 719 04/01/2018 FINDINGS: CT Brain Perfusion Findings: CBF (<30%) Volume: None Perfusion (Tmax>6.0s) volume: None Mismatch Volume: Not applicable Infarction Location:Not applicable CTA NECK Skeleton: No acute osseous abnormality identified. Prior sternotomy. Upper chest: Apical and upper lobe paraseptal and centrilobular emphysema. No superior mediastinal lymphadenopathy. Other neck: No acute findings. Aortic arch: Calcified aortic atherosclerosis. 3 vessel arch configuration. Right carotid system: No brachiocephalic artery stenosis despite plaque. Normal right CCA origin. Mild plaque proximal to the bifurcation without stenosis. Minimal plaque at the right ICA origin without stenosis. Left carotid system: No left CCA origin stenosis despite plaque. Mild additional plaque proximal to the bifurcation. Calcified plaque at the left ICA origin and bulb with less than 50% stenosis with respect to the distal vessel. Vertebral arteries: No proximal right subclavian artery stenosis despite plaque. 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 Soft and calcified plaque in the proximal left subclavian artery without significant stenosis. Calcified plaque at the left vertebral artery origin with only mild stenosis (series 6, image 122). The  left vertebral remains patent to the skull base without stenosis. CTA HEAD Posterior circulation: Diminutive and poorly enhancing right Vertebral artery V4 segment is stable. The left V4 remains patent and supplies the basilar without stenosis. Patent left PICA origin. Patent basilar artery with stable appearance since 2019. Patent SCA origins. Fetal type bilateral PCA origins again noted. Bilateral PCA branches are stable with mild irregularity. Anterior circulation: Both ICA siphons are patent. Both supraclinoid segments are heavily calcified and appear stable since 2019. On the left there is moderate associated stenosis. On the right there is mild to moderate stenosis. Patent carotid termini. MCA and ACA origins are stable and patent. Diminutive anterior communicating artery. Bilateral ACA branches are within normal limits. Left MCA M1 segment and bifurcation appear stable and patent without stenosis. Right MCA M1 segment and bifurcation appear stable and patent. Visible bilateral MCA branches are stable since 2019. Venous sinuses: Patent. Anatomic variants: Fetal type bilateral PCA origins. Review of the MIP images confirms the above findings IMPRESSION: 1. Negative for emergent large vessel occlusion. Patent basilar artery and unchanged posterior circulation since 2019. 2. CT Perfusion does not detect ischemic penumbra or core infarct. 3. Positive for chronic occlusion of the right vertebral artery, unchanged. 4. Positive also for chronic calcified ICA siphons with moderate left and mild to moderate right supraclinoid stenosis. 5. Aortic Atherosclerosis (ICD10-I70.0) and Emphysema (ICD10-J43.9). These results were communicated to Dr. Cheral Marker at 2:08 pm on 08/27/2019 by text page via the [facility] messaging system. Electronically Signed   By: Genevie Ann M.D.   On: 08/27/2019 14:08   CT Code Stroke CTA Neck W/WO contrast  Result Date: 08/27/2019 CLINICAL DATA:  63 year old male unresponsive. Scattered small age  indeterminate infarcts in the left MCA territory on plain CT today. EXAM: CT ANGIOGRAPHY HEAD AND NECK CT PERFUSION BRAIN TECHNIQUE: Multidetector CT imaging of the head and neck was performed using the standard protocol during  bolus administration of intravenous contrast. Multiplanar CT image reconstructions and MIPs were obtained to evaluate the vascular anatomy. Carotid stenosis measurements (when applicable) are obtained utilizing NASCET criteria, using the distal internal carotid diameter as the denominator. Multiphase CT imaging of the brain was performed following IV bolus contrast injection. Subsequent parametric perfusion maps were calculated using RAPID software. CONTRAST:  163mL OMNIPAQUE IOHEXOL 350 MG/ML SOLN COMPARISON:  Plain CT 1328 hours. CTA head and neck 719 04/01/2018 FINDINGS: CT Brain Perfusion Findings: CBF (<30%) Volume: None Perfusion (Tmax>6.0s) volume: None Mismatch Volume: Not applicable Infarction Location:Not applicable CTA NECK Skeleton: No acute osseous abnormality identified. Prior sternotomy. Upper chest: Apical and upper lobe paraseptal and centrilobular emphysema. No superior mediastinal lymphadenopathy. Other neck: No acute findings. Aortic arch: Calcified aortic atherosclerosis. 3 vessel arch configuration. Right carotid system: No brachiocephalic artery stenosis despite plaque. Normal right CCA origin. Mild plaque proximal to the bifurcation without stenosis. Minimal plaque at the right ICA origin without stenosis. Left carotid system: No left CCA origin stenosis despite plaque. Mild additional plaque proximal to the bifurcation. Calcified plaque at the left ICA origin and bulb with less than 50% stenosis with respect to the distal vessel. Vertebral arteries: No proximal right subclavian artery stenosis despite plaque. 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 Soft and calcified plaque in the proximal left subclavian artery without  significant stenosis. Calcified plaque at the left vertebral artery origin with only mild stenosis (series 6, image 122). The left vertebral remains patent to the skull base without stenosis. CTA HEAD Posterior circulation: Diminutive and poorly enhancing right Vertebral artery V4 segment is stable. The left V4 remains patent and supplies the basilar without stenosis. Patent left PICA origin. Patent basilar artery with stable appearance since 2019. Patent SCA origins. Fetal type bilateral PCA origins again noted. Bilateral PCA branches are stable with mild irregularity. Anterior circulation: Both ICA siphons are patent. Both supraclinoid segments are heavily calcified and appear stable since 2019. On the left there is moderate associated stenosis. On the right there is mild to moderate stenosis. Patent carotid termini. MCA and ACA origins are stable and patent. Diminutive anterior communicating artery. Bilateral ACA branches are within normal limits. Left MCA M1 segment and bifurcation appear stable and patent without stenosis. Right MCA M1 segment and bifurcation appear stable and patent. Visible bilateral MCA branches are stable since 2019. Venous sinuses: Patent. Anatomic variants: Fetal type bilateral PCA origins. Review of the MIP images confirms the above findings IMPRESSION: 1. Negative for emergent large vessel occlusion. Patent basilar artery and unchanged posterior circulation since 2019. 2. CT Perfusion does not detect ischemic penumbra or core infarct. 3. Positive for chronic occlusion of the right vertebral artery, unchanged. 4. Positive also for chronic calcified ICA siphons with moderate left and mild to moderate right supraclinoid stenosis. 5. Aortic Atherosclerosis (ICD10-I70.0) and Emphysema (ICD10-J43.9). These results were communicated to Dr. Cheral Marker at 2:08 pm on 08/27/2019 by text page via the [facility] messaging system. Electronically Signed   By: Genevie Ann M.D.   On: 08/27/2019 14:08   MR BRAIN  WO CONTRAST  Result Date: 08/27/2019 CLINICAL DATA:  Neuro deficit acute.  Rule out stroke.  ESRD. EXAM: MRI HEAD WITHOUT CONTRAST TECHNIQUE: Multiplanar, multiecho pulse sequences of the brain and surrounding structures were obtained without intravenous contrast. COMPARISON:  MRI head 04/01/2018 FINDINGS: Brain: Negative for acute infarct. Mild white matter changes consistent with chronic microvascular ischemia. Negative for hemorrhage or mass. Vascular: Normal arterial flow voids. Skull  and upper cervical spine: No focal skeletal lesion. Sinuses/Orbits: Mild mucosal edema paranasal sinuses. Bilateral cataract surgery Other: None IMPRESSION: No acute abnormality. Mild chronic microvascular ischemic change in the white matter is stable from the prior study. Electronically Signed   By: Franchot Gallo M.D.   On: 08/27/2019 16:41   DG Pelvis Portable  Result Date: 08/27/2019 CLINICAL DATA:  Evaluation for metallic devices prior to MRI a EXAM: PORTABLE PELVIS 1-2 VIEWS COMPARISON:  None. FINDINGS: There are metallic devices overlying the right lower quadrant just above the inguinal ligament and overlying the left lower quadrant just above the left iliac crest, both of which are probably extrinsic to the patient. There are safety pins overlying the pelvis as well. IMPRESSION: Metallic foreign bodies overlying the pelvis as described including safety pins and 2 devices which are likely extrinsic to the patient. Electronically Signed   By: Lorriane Shire M.D.   On: 08/27/2019 14:54   CT Code Stroke Cerebral Perfusion with contrast  Result Date: 08/27/2019 CLINICAL DATA:  63 year old male unresponsive. Scattered small age indeterminate infarcts in the left MCA territory on plain CT today. EXAM: CT ANGIOGRAPHY HEAD AND NECK CT PERFUSION BRAIN TECHNIQUE: Multidetector CT imaging of the head and neck was performed using the standard protocol during bolus administration of intravenous contrast. Multiplanar CT image  reconstructions and MIPs were obtained to evaluate the vascular anatomy. Carotid stenosis measurements (when applicable) are obtained utilizing NASCET criteria, using the distal internal carotid diameter as the denominator. Multiphase CT imaging of the brain was performed following IV bolus contrast injection. Subsequent parametric perfusion maps were calculated using RAPID software. CONTRAST:  142mL OMNIPAQUE IOHEXOL 350 MG/ML SOLN COMPARISON:  Plain CT 1328 hours. CTA head and neck 719 04/01/2018 FINDINGS: CT Brain Perfusion Findings: CBF (<30%) Volume: None Perfusion (Tmax>6.0s) volume: None Mismatch Volume: Not applicable Infarction Location:Not applicable CTA NECK Skeleton: No acute osseous abnormality identified. Prior sternotomy. Upper chest: Apical and upper lobe paraseptal and centrilobular emphysema. No superior mediastinal lymphadenopathy. Other neck: No acute findings. Aortic arch: Calcified aortic atherosclerosis. 3 vessel arch configuration. Right carotid system: No brachiocephalic artery stenosis despite plaque. Normal right CCA origin. Mild plaque proximal to the bifurcation without stenosis. Minimal plaque at the right ICA origin without stenosis. Left carotid system: No left CCA origin stenosis despite plaque. Mild additional plaque proximal to the bifurcation. Calcified plaque at the left ICA origin and bulb with less than 50% stenosis with respect to the distal vessel. Vertebral arteries: No proximal right subclavian artery stenosis despite plaque. 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 Soft and calcified plaque in the proximal left subclavian artery without significant stenosis. Calcified plaque at the left vertebral artery origin with only mild stenosis (series 6, image 122). The left vertebral remains patent to the skull base without stenosis. CTA HEAD Posterior circulation: Diminutive and poorly enhancing right Vertebral artery V4 segment is stable.  The left V4 remains patent and supplies the basilar without stenosis. Patent left PICA origin. Patent basilar artery with stable appearance since 2019. Patent SCA origins. Fetal type bilateral PCA origins again noted. Bilateral PCA branches are stable with mild irregularity. Anterior circulation: Both ICA siphons are patent. Both supraclinoid segments are heavily calcified and appear stable since 2019. On the left there is moderate associated stenosis. On the right there is mild to moderate stenosis. Patent carotid termini. MCA and ACA origins are stable and patent. Diminutive anterior communicating artery. Bilateral ACA branches are within normal limits. Left MCA  M1 segment and bifurcation appear stable and patent without stenosis. Right MCA M1 segment and bifurcation appear stable and patent. Visible bilateral MCA branches are stable since 2019. Venous sinuses: Patent. Anatomic variants: Fetal type bilateral PCA origins. Review of the MIP images confirms the above findings IMPRESSION: 1. Negative for emergent large vessel occlusion. Patent basilar artery and unchanged posterior circulation since 2019. 2. CT Perfusion does not detect ischemic penumbra or core infarct. 3. Positive for chronic occlusion of the right vertebral artery, unchanged. 4. Positive also for chronic calcified ICA siphons with moderate left and mild to moderate right supraclinoid stenosis. 5. Aortic Atherosclerosis (ICD10-I70.0) and Emphysema (ICD10-J43.9). These results were communicated to Dr. Cheral Marker at 2:08 pm on 08/27/2019 by text page via the [facility] messaging system. Electronically Signed   By: Genevie Ann M.D.   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 Both lungs are clear. The visualized skeletal structures are unremarkable. IMPRESSION: Cardiomegaly without  acute abnormality of the lungs in AP portable projection Electronically Signed   By: Eddie Candle M.D.   On: 08/27/2019 14:50   DG Abd Portable 1 View  Result Date: 08/27/2019 CLINICAL DATA:  Checking for devices prior to MRI. EXAM: PORTABLE ABDOMEN - 1 VIEW COMPARISON:  Abdominal radiograph dated 06/08/2016 FINDINGS: There is a complex metallic device overlying the right lower quadrant which is likely extrinsic to the patient. There is a circular and a metallic device overlying the left lower quadrant which is also likely extrinsic to the safety pins also overlie the right and left sides of the lower abdomen. Bowel gas pattern is normal. No acute bone abnormality. IMPRESSION: 1. Benign-appearing abdomen. 2. Metallic foreign bodies overlying the right lower quadrant and left lower quadrant. These are both likely extrinsic to the patient. Safety pins are also present. Electronically Signed   By: Lorriane Shire M.D.   On: 08/27/2019 14:51   EEG adult  Result Date: 08/27/2019 Lora Havens, MD     08/27/2019  6:22 PM Patient Name: Kenneth [name] MRN: [id number] Epilepsy Attending: Lora Havens Referring Physician/Provider: Laurey Morale, NP Date: 08/27/2019 Duration: 30.7mins Patient history: 63yo F with sudden onset of ams. EEG to evaluate for seizure. Level of alertness: awake, asleep AEDs during EEG study: None Technical aspects: This EEG study was done with scalp electrodes positioned according to the 10-20 International system of electrode placement. Electrical activity was acquired at a sampling rate of 500Hz  and reviewed with a high frequency filter of 70Hz  and a low frequency filter of 1Hz . EEG data were recorded continuously and digitally stored. DESCRIPTION: During awake state, the posterior dominant rhythm consists of 8-9 Hz activity of moderate voltage (25-35 uV) seen predominantly in posterior head regions, symmetric and reactive to eye opening and eye closing. Sleep was characterized by  sleep spindles (12-14Hz ), maximal frontocentral, and intermittent generalized 2-3Hz  delta slowing.       Hyperventilation and photic stimulation were not performed. IMPRESSION: This study is within normal limits. No seizures or epileptiform discharges were seen throughout the recording. Lora Havens   CT HEAD CODE STROKE WO CONTRAST  Result Date: 08/27/2019 CLINICAL DATA:  Code stroke. 63 year old male is unresponsive. Weakness. EXAM: CT HEAD WITHOUT CONTRAST TECHNIQUE: Contiguous axial images were obtained from the base of the skull through the vertex without intravenous contrast. COMPARISON:  Head CT 04/01/2018. Brain MRI 04/01/2018. FINDINGS: Brain: Stable cerebral volume since 2019. No midline shift, ventriculomegaly, mass effect, evidence of mass lesion, intracranial hemorrhage or evidence of cortically based acute infarction. Patchy periventricular white matter hypodensity appears stable. Subtle new cortical hypodensity in the anterior and posterior left MCA territory seen on series 3, image 22 and sagittal image 42. There is also a small new round hypodensity in the left posterior lentiform on image 18. But no other CT changes of acute ischemia identified. Vascular: Calcified atherosclerosis at the skull base. No suspicious intracranial vascular hyperdensity. Skull: No acute osseous abnormality identified. Sinuses/Orbits: Visualized paranasal sinuses and mastoids are stable and well pneumatized. Other: No acute orbit or scalp soft tissue findings. There is some calcified scalp vessel atherosclerosis. ASPECTS [facility] Early CT Score) Total score (0-10 with 10 being normal): 7; given the scattered distribution of the 3 small hypodense areas in the left MCA territory. IMPRESSION: 1. 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 No associated hemorrhage or mass effect. 2. Elsewhere stable non  contrast CT appearance of the brain since 2019. 3. Given the distribution of lesions in #1 ASPECTS would be 7. 4. These results were communicated to Dr. Cheral Marker at 1:42 pm on 08/27/2019 by text page via the [facility] messaging system. Electronically Signed   By: Genevie Ann M.D.   On: 08/27/2019 13:42    EKG: Independently reviewed.no acute changes, chronic ST elevation in V1-V3 (similar to EKG from 04/2019) with no TWI.   Assessment/Plan  Active Problems:   ESRD (end stage renal disease) (HCC)   CAD (coronary artery disease)   Diabetes mellitus with complication (HCC)   Chest pain   Chronic diastolic CHF (congestive heart failure) ([place])   Hypertensive emergency   Hypertensive heart disease   Hyperlipidemia   Status post coronary artery stent placement   Altered mental status   Unresponsive episode   AF (paroxysmal atrial fibrillation) (HCC)   Altered mental status.  Hypoactive delirium?Marland Kitchen  Acute stroke ruled out.  EEG unremarkable, doubt infection given afebrile, no white count, no systemic symptoms.  Ethanol less than 10, glucose wnl.  Has no obvious sedating medications on home regimen other than oxycodone, BUN fine--adherent to HD per wife. Could be related to hypertension.  Opens eyes to voice to me and tracks.  -Check UDS, UA -Check TSH, B12, RPR, Amonia -Avoid sedatives -Frequent neurochecks, stepdown unit monitoring  Nonspecific back pain in setting of hypertensive urgency. Wife reports sudden back and chest pain during presentation in setting of elevated blood pressure. EKG nonischemic and high sensitivity troponin down trended from (76 to 65). Given vascular history and presentation concern for aortic dissection.  Abdominal x-ray noncontributory -Stat cta chest/abd/pelvis  Reported chest pain in patient with CAD S/p CABG(2017), history of instent stenosis. High-sensitivity troponin with downtrend/insignficant change 76-- down to 65 in 2 hours.  EKG with chronic elevation of ST in V2-V3  (compared to EKG from 04/2019) consistent with LVH with no acute ischemic changes. Doubt ACS, maybe element of unstable angina vs demand ischemia in setting of HTN urgency  --Discussed case with on- call cardiologist Dr. Arn Medal who reviewed EKG and labwork and was also reassured by troponin trend and agreed atypical presentation and not likely ACS--did agree with CTA to rule out aortic dissection as mentioned above -Monitor on telemetry -Resume Imdur once able to tolerate PO safely --has history of atypical  chest pain, given overall stability, will recommend consulting cardiology in am if still some concern for ACS --CTA chest/abd/pelvis STAT to r/o aortic dissection  Hypertensive emergency, SBP 180s on admission with report > 200 on EMS evaluation per wife. Acute stroke ruled out. Given acute back pain/chest pain at home will rule out possible aortic dissection. BP already better in ED with SBP in 140s -Resume home regimen once able to safely tolerate PO (not yet) -Home regimen includes: Amlodipine 10 mg, losartan 100 mg daily, hydralazine 25 3 times daily -Add PRN IV hydralazine --STAT CTA c/abd/pelvis to rule out aortic dissection  Paroxysmal atrial fibrillation, currently rate controlled and in normal sinus rhythm CHA2DS2-VASc 2:6 - IV heparin while monitoring for continued improvement from confusion before resuming home eliquis -Monitor on telemetry  ESRD on HD, Monday Wednesday Friday.  Wife reports has tolerated previous outpatient HD sessions. -No acute dialysis needs -Need to consult nephrology in a.m. upon schedule  Ischemic cardiomyopathy.  Most recent EF on 8/20 preserved.  Euvolemic on exam. -Daily weights, monitor volume status  Type 2 diabetes with peripheral neuropathy.  Controlled.  A1c 7% (03/2019). -Resume gabapentin once able to safely tolerate p.o. -Sliding scale insulin on n.p.o. protocol, monitor CBGs  Mood disorder, stable -Resume home Celexa once able to  tolerate p.o.  Hyperlipidemia, stable -Holding home Lipitor until safely able to tolerate p.o.     DVT Prophylaxis Heparin drip AM Labs Ordered, also please review Full Orders  Family Communication: Admission, patients condition and plan of care including tests being ordered have been discussed with the patient's wife by phone interpreter (speaks [place]) who indicates understanding and agree with the plan and Code Status.  Code Status FULL  Condition GUARDED    Consults called: Neurology, Call nephrology in am for dialysis.   Admission status: inpatient       Desiree Hane M.D on 08/27/2019 at 7:32 PM  To page go to [email or web address] - password [email or web address]   If 7PM-7AM, please contact night-coverage [email or web address] Password [facility]  08/27/2019, 7:32 PM

## 2019-08-27 NOTE — ED Triage Notes (Signed)
Pt arrives to ED from home who suddenly went unresponsive at home around 1230 today after complaining of chest pain this morning. On arrival to ED patient is lethargic and not respond to verbal or painful stimuli.

## 2019-08-27 NOTE — ED Notes (Signed)
EEG at bedside.

## 2019-08-27 NOTE — ED Notes (Signed)
Pt to CT

## 2019-08-27 NOTE — Progress Notes (Signed)
EEG complete - results pending 

## 2019-08-27 NOTE — Consult Note (Addendum)
NEURO HOSPITALIST  CONSULT   Requesting Physician: Dr. Regenia Skeeter    Chief Complaint: Sudden onset unresponsiveness  History obtained from:  EMS  HPI:                                                                                                                                         Kenneth [name] is an 63 y.o. male with PMH NSTEMI, paroxysmal  a  Fib ( on eliquis),  HTN, HLD, ESRD ( on dialysis) who presented to [facility] via EMS with c/o sudden onset unresponsiveness. Code stroke initiated in ED.   Per EMS patient was at home  C/o of chest pain this morning and then later at 1230 suddenly became unresponsive.   ED course:  CTH: no hemorrhage. CTA: stable no basilar artery  occlusion BP: 167/59 BG: 133   Patient not a TPA candidate d/t eliquis.    Past Medical History:  Diagnosis Date  . Acute pulmonary edema ([place]) 05/31/2016  . Acute respiratory failure with hypoxemia ([place]) 05/31/2016  . CAD (coronary artery disease) 10/30/2016   NSTEMI 9/17 with CABG x 3 (LIMA-LAD, SVG-OM, SVG-RCA).   - NSTEMI 10/18 s/p DES to ostial SVG to  OM  . Depression   . Diabetic foot ulcer ([place]) 04/11/2017  . 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 ESRD (end stage renal disease) on dialysis [facility])    "MWF; [place]" (03/30/2018)  . Gastroesophageal reflux disease 08/18/2016  . HCAP (healthcare-associated pneumonia)   . Hemodialysis status ([place])   . Hyperlipidemia   . Hypertension   . Hypertensive heart disease 09/12/2017  . Ischemic rest pain of lower extremity 02/06/2015  . 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 NSTEMI (non-ST elevated myocardial infarction) ([place])   . PAF (paroxysmal atrial fibrillation) ([place])   . Pleural effusion   . Pronation deformity of both feet 02/27/2015    Past Surgical History:  Procedure Laterality Date  . AV FISTULA PLACEMENT Left 06/22/2016   Procedure: ARTERIOVENOUS (AV) FISTULA CREATION LEFT  UPPER ARM;  Surgeon: Rosetta Posner, MD;  Location: [place];  Service: Vascular;  Laterality: Left;  . CARDIAC CATHETERIZATION N/A 05/31/2016   Procedure: Left Heart Cath and Coronary Angiography;  Surgeon: Jettie Booze, MD;  Location: [place] [facility];  Service: Cardiovascular;  Laterality: N/A;  . CARDIAC CATHETERIZATION N/A 05/31/2016   Procedure: Right Heart Cath;  Surgeon: Jettie Booze, MD;  Location: [place] [facility];  Service: Cardiovascular;  Laterality: N/A;  . CARDIAC CATHETERIZATION N/A 05/31/2016   Procedure: IABP Insertion;  Surgeon: Jettie Booze, MD;  Location: [place] [facility];  Service: Cardiovascular;  Laterality: N/A;  . CORONARY ARTERY BYPASS GRAFT N/A 06/05/2016   Procedure: CORONARY ARTERY BYPASS GRAFTING (CABG) x3 LIMA to LAD -SVG to OM -SVG to RCA;  Surgeon: Ivin Poot, MD;  Location: [place];  Service: Open Heart Surgery;  Laterality: N/A;  . CORONARY STENT INTERVENTION N/A 07/07/2017   Procedure: CORONARY STENT INTERVENTION;  Surgeon: Wellington Hampshire, MD;  Location: [place] [facility];  Service: Cardiovascular;  Laterality: N/A;  . CORONARY STENT INTERVENTION N/A 03/30/2018   Procedure: CORONARY STENT INTERVENTION;  Surgeon: Martinique, Peter M, MD;  Location: [place] [facility];  Service: Cardiovascular;  Laterality: N/A;  . INSERTION OF DIALYSIS CATHETER N/A 06/05/2016   Procedure: INSERTION OF DIALYSIS/trialysis CATHETER;  Surgeon: Ivin Poot, MD;  Location: [place];  Service: Vascular;  Laterality: N/A;  . INSERTION OF DIALYSIS CATHETER Right 06/13/2016   Procedure: INSERTION OF DIALYSIS CATHETER RIGHT INTERNAL JUGULAR;  Surgeon: Conrad Wortham, MD;  Location: [place];  Service: Vascular;  Laterality: Right;  . INTRAOPERATIVE TRANSESOPHAGEAL ECHOCARDIOGRAM N/A 06/05/2016   Procedure: INTRAOPERATIVE TRANSESOPHAGEAL ECHOCARDIOGRAM;  Surgeon: Ivin Poot, MD;  Location: [place];  Service: Open Heart Surgery;  Laterality: N/A;  . LEFT HEART CATH AND  CORS/GRAFTS ANGIOGRAPHY N/A 11/03/2016   Procedure: Left Heart Cath and Cors/Grafts Angiography;  Surgeon: Troy Sine, MD;  Location: [place] [facility];  Service: Cardiovascular;  Laterality: N/A;  . LEFT HEART CATH AND CORS/GRAFTS ANGIOGRAPHY N/A 07/07/2017   Procedure: LEFT HEART CATH AND CORS/GRAFTS ANGIOGRAPHY;  Surgeon: Larey Dresser, MD;  Location: [place] [facility];  Service: Cardiovascular;  Laterality: N/A;  . LEFT HEART CATH AND CORS/GRAFTS ANGIOGRAPHY N/A 03/30/2018   Procedure: LEFT HEART CATH AND CORS/GRAFTS ANGIOGRAPHY;  Surgeon: Martinique, Peter M, MD;  Location: [place] [facility];  Service: Cardiovascular;  Laterality: N/A;  . LEFT HEART CATH AND CORS/GRAFTS ANGIOGRAPHY N/A 04/17/2019   Procedure: LEFT HEART CATH AND CORS/GRAFTS ANGIOGRAPHY;  Surgeon: Larey Dresser, MD;  Location: [place] [facility];  Service: Cardiovascular;  Laterality: N/A;  . REVISION OF ARTERIOVENOUS GORETEX GRAFT Left [id number]   Procedure: PLICATION OF ANEURYSM OF ARTERIOVENOUS FISTULA  LEFT ARM;  Surgeon: Rosetta Posner, MD;  Location: [facility];  Service: Vascular;  Laterality: Left;    Family History  Problem Relation Age of Onset  . CAD Father   . Colon cancer Father   . Diabetes Brother          Social History:  reports that he quit smoking about 6 years ago. His smoking use included cigarettes. He has a 52.50 pack-year smoking history. He has never used smokeless tobacco. He reports that he does not drink alcohol or use drugs.  Allergies: No Known Allergies  Medications:  No current facility-administered medications for this encounter.   Current Outpatient Medications  Medication Sig Dispense Refill  . acetaminophen (TYLENOL) 500 MG tablet Take 1,000 mg by mouth every 6 (six) hours as needed for mild pain or headache.     Marland Kitchen amLODipine (NORVASC) 10 MG tablet Take 10 mg by  mouth at bedtime.     Marland Kitchen apixaban (ELIQUIS) 5 MG TABS tablet Take 1 tablet (5 mg total) by mouth 2 (two) times daily. 180 tablet 1  . Ascorbic Acid (VITAMIN C) 1000 MG tablet Take 1,000 mg by mouth 3 (three) times daily.     Marland Kitchen atorvastatin (LIPITOR) 80 MG tablet Take 1 tablet by mouth once daily (Patient taking differently: Take 80 mg by mouth daily with lunch. ) 90 tablet 0  . calcium acetate (PHOSLO) 667 MG capsule Take 667 mg by mouth 3 (three) times daily with meals.     . citalopram (CELEXA) 20 MG tablet Take 20 mg by mouth daily with lunch.   1  . ezetimibe (ZETIA) 10 MG tablet Take 1 tablet by mouth once daily 90 tablet 0  . gabapentin (NEURONTIN) 100 MG capsule Take 100 mg by mouth daily with lunch.   0  . hydrALAZINE (APRESOLINE) 25 MG tablet Take 1 tablet (25 mg total) by mouth every 8 (eight) hours. 90 tablet 6  . Insulin Human (INSULIN PUMP) SOLN Inject into the skin continuous. NOVOLOG    . isosorbide mononitrate (IMDUR) 60 MG 24 hr tablet Take 1 tablet by mouth once daily (Patient taking differently: Take 60 mg by mouth daily. ) 90 tablet 0  . losartan (COZAAR) 100 MG tablet Take 100 mg by mouth at bedtime.     . metoprolol succinate (TOPROL-XL) 25 MG 24 hr tablet TAKE 1 TABLET BY MOUTH AT BEDTIME (Patient not taking: No sig reported) 90 tablet 0  . NOVOLOG 100 UNIT/ML injection Inject 6-10 Units into the skin See admin instructions. Starts with 10 units per day and adds 6 units with each meal via pump    . ofloxacin (OCUFLOX) 0.3 % ophthalmic solution Place 1 drop into both eyes 3 (three) times daily.     Marland Kitchen oxyCODONE (ROXICODONE) 5 MG immediate release tablet Take 1 tablet (5 mg total) by mouth every 6 (six) hours as needed. 8 tablet 0  . pantoprazole (PROTONIX) 40 MG tablet Take 40 mg by mouth daily before lunch.   0    ROS:                                                                                                                                        unobtainable from  patient due to mental status and lack of cooperation   General Examination:  Blood pressure (!) 186/75, pulse 73, temperature 97.7 F (36.5 C), temperature source Temporal, resp. rate 15, SpO2 99 %.  Physical Exam  Constitutional: Appears well-developed and well-nourished.  Psych: Affect appropriate to situation Eyes: Normal external eye and conjunctiva. HENT: Normocephalic, no lesions, without obvious abnormality.   Musculoskeletal-no joint tenderness, deformity or swelling Cardiovascular: Normal rate and regular rhythm.  Respiratory: Effort normal, non-labored breathing saturations WNL GI: Soft.  No distension. There is no tenderness.  Skin: WDI  Neurological Examination Mental Status: Patient appearing obtunded, not following commands, not responding to verbal stimuli or noxious. Smelling salt was placed under patients nose, and after approx. 30 seconds patient began to cough and sit up. Patient helped to full seated position but was able to sit up with contact assistance. Eyes were open but he would not answer questions. With passive elevation and release of LUE over his face, it consistently fell away from face to below and above head on separate attempts. Team then commented on this and subsequently with testing of release of RUE, it drifted slowly to face, covering eyes. No attempts at verbalization or nonverbal communication. Not following commands. Kept eyes tightly clenched when attempting to open. Doll's eye was suppressed with oculocephalic maneuver, position of patient's eyes changing from a midline stare to saccades to left and right during this maneuver, then returning back to midline when maneuver was completed. Findings suggestive of preserved cortical function and volitional movement with conscious evasion of exam.  Cranial Nerves: Patient initial clenches eyes shut when trying  to see pupils. PERRL Patient opened eyes to smelling salt. Eyes conjugate. Fixed gaze straight ahead. Did not blink to threat. + corneal reflexes  Motor: BUE flaccid, but missed his forehead. BLE extended legs with feet flexed and increased extensor tone but appears not to be true rigidity, posturing or spasticity as leg extension relaxed when he was in the seated position after smelling salts. Patient did have sponteous movements in left foot during CT.  Sensory: Did not move upper or lower extremities to sternal rub, pinch x 4 or noxious plantar stimulation bilaterally.  Reflexes: Hypoactve throughout.   Lab Results:   Basic Metabolic Panel: Recent Labs  Lab 08/27/19 1320 08/27/19 1328  NA 137 136  K 5.1 5.1  CL 96* 99  CO2 24  --   GLUCOSE 138* 133*  BUN 47* 49*  CREATININE 9.18* 9.50*  CALCIUM 9.9  --     CBC: Recent Labs  Lab 08/27/19 1320 08/27/19 1328  WBC 9.9  --   NEUTROABS 7.6  --   HGB 10.8* 11.9*  HCT 33.1* 35.0*  MCV 97.1  --   PLT 210  --    CBG: Recent Labs  Lab 08/27/19 1318  GLUCAP 132*    Imaging: CT HEAD CODE STROKE WO CONTRAST  Result Date: 08/27/2019 CLINICAL DATA:  Code stroke. 63 year old male is unresponsive. Weakness. EXAM: CT HEAD WITHOUT CONTRAST TECHNIQUE: Contiguous axial images were obtained from the base of the skull through the vertex without intravenous contrast. COMPARISON:  Head CT 04/01/2018. Brain MRI 04/01/2018. FINDINGS: Brain: Stable cerebral volume since 2019. No midline shift, ventriculomegaly, mass effect, evidence of mass lesion, intracranial hemorrhage or evidence of cortically based acute infarction. Patchy periventricular white matter hypodensity appears stable. Subtle new cortical hypodensity in the anterior and posterior left MCA territory seen on series 3, image 22 and sagittal image 42. There is also a small new round hypodensity in the left posterior lentiform on image 18. But  no other CT changes of acute ischemia  identified. Vascular: Calcified atherosclerosis at the skull base. No suspicious intracranial vascular hyperdensity. Skull: No acute osseous abnormality identified. Sinuses/Orbits: Visualized paranasal sinuses and mastoids are stable and well pneumatized. Other: No acute orbit or scalp soft tissue findings. There is some calcified scalp vessel atherosclerosis. ASPECTS [address] Stroke Program Early CT Score) Total score (0-10 with 10 being normal): 7; given the scattered distribution of the 3 small hypodense areas in the left MCA territory. IMPRESSION: 1. 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 No associated hemorrhage or mass effect. 2. Elsewhere stable non contrast CT appearance of the brain since 2019. 3. Given the distribution of lesions in #1 ASPECTS would be 7. 4. These results were communicated to Dr. Cheral Marker at 1:42 pm on 08/27/2019 by text page via the [facility] messaging system. Electronically Signed   By: Genevie Ann M.D.   On: 08/27/2019 13:42    Laurey Morale, MSN, NP-C [facility] [phone] 08/27/2019, 2:05 PM   CTA head/neck with CTP: 1. Negative for emergent large vessel occlusion. Patent basilar artery and unchanged posterior circulation since 2019. 2. CT Perfusion does not detect ischemic penumbra or core infarct. 3. Positive for chronic occlusion of the right vertebral artery, unchanged. 4. Positive also for chronic calcified ICA siphons with moderate left and mild to moderate right supraclinoid stenosis.    Assessment:   63 y.o. male with PMHx NSTEMI, paroxysmal a-Fib (on Eliquis), HTN, HLD, ESRD ( on dialysis) who presented to [facility] via EMS with sudden onset unresponsiveness. Code stroke initiated in ED.  1. On exam patient did not follow commands, blink to threat or respond to noxious initially. However, with smelling salts he did sit up, cough and open eyes.  Preserved brainstem reflexes on exam. Cortical function may be compromised or fully intact as it is difficult to distinguish between embellishment/factitious disorder and AMS on exam, which overall was atypical for stroke. No jerking or twitching to suggest a seizure.  2. Consistent with the above, CTA showed no LVO. 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 CTP negative for perfusion deficit.   3. Stroke Risk Factors - atrial fibrillation, diabetes mellitus, hyperlipidemia and hypertension, CAD and DM    Recommendations: -STAT MRI -STAT EEG -Continue Eliquis when able to take PO. If unable to take PO by tomorrow AM, start IV heparin.  --Please page stroke NP  Or  PA  Or MD from 8am -4 pm  as this patient from this time will be  followed by the stroke.   You can look them up on [email or web address]  Password [email or web address]   I have seen and examined the patient. I have formulated the assessment and recommendations. 63 year old male with acute onset of unresponsiveness. Exam with inconsistencies suggestive of embellishment versus atypical presentation for AMS. CTA without LVO. MRI and EEG are pending. Continue Eliquis for atrial fibrillation.  Electronically signed: Dr. Kerney Elbe

## 2019-08-27 NOTE — ED Notes (Signed)
MD Regenia Skeeter at bedside to evaluate for Code Stroke

## 2019-08-28 ENCOUNTER — Inpatient Hospital Stay (HOSPITAL_COMMUNITY): Payer: Medicare Other

## 2019-08-28 DIAGNOSIS — M545 Low back pain: Secondary | ICD-10-CM

## 2019-08-28 DIAGNOSIS — Z955 Presence of coronary angioplasty implant and graft: Secondary | ICD-10-CM

## 2019-08-28 DIAGNOSIS — I161 Hypertensive emergency: Secondary | ICD-10-CM

## 2019-08-28 DIAGNOSIS — W19XXXA Unspecified fall, initial encounter: Secondary | ICD-10-CM

## 2019-08-28 DIAGNOSIS — R4189 Other symptoms and signs involving cognitive functions and awareness: Secondary | ICD-10-CM

## 2019-08-28 LAB — CBC
HCT: 30.9 % — ABNORMAL LOW (ref 39.0–52.0)
Hemoglobin: 10.1 g/dL — ABNORMAL LOW (ref 13.0–17.0)
MCH: 31.8 pg (ref 26.0–34.0)
MCHC: 32.7 g/dL (ref 30.0–36.0)
MCV: 97.2 fL (ref 80.0–100.0)
Platelets: 195 10*3/uL (ref 150–400)
RBC: 3.18 MIL/uL — ABNORMAL LOW (ref 4.22–5.81)
RDW: 15.7 % — ABNORMAL HIGH (ref 11.5–15.5)
WBC: 9 10*3/uL (ref 4.0–10.5)
nRBC: 0 % (ref 0.0–0.2)

## 2019-08-28 LAB — COMPREHENSIVE METABOLIC PANEL
ALT: 17 U/L (ref 0–44)
AST: 16 U/L (ref 15–41)
Albumin: 3.6 g/dL (ref 3.5–5.0)
Alkaline Phosphatase: 67 U/L (ref 38–126)
Anion gap: 17 — ABNORMAL HIGH (ref 5–15)
BUN: 53 mg/dL — ABNORMAL HIGH (ref 8–23)
CO2: 22 mmol/L (ref 22–32)
Calcium: 9.3 mg/dL (ref 8.9–10.3)
Chloride: 94 mmol/L — ABNORMAL LOW (ref 98–111)
Creatinine, Ser: 10.59 mg/dL — ABNORMAL HIGH (ref 0.61–1.24)
GFR calc Af Amer: 5 mL/min — ABNORMAL LOW (ref >60)
GFR calc non Af Amer: 5 mL/min — ABNORMAL LOW (ref >60)
Glucose, Bld: 124 mg/dL — ABNORMAL HIGH (ref 70–99)
Potassium: 6.1 mmol/L — ABNORMAL HIGH (ref 3.5–5.1)
Sodium: 133 mmol/L — ABNORMAL LOW (ref 135–145)
Total Bilirubin: 0.9 mg/dL (ref 0.3–1.2)
Total Protein: 6.8 g/dL (ref 6.5–8.1)

## 2019-08-28 LAB — VITAMIN B12: Vitamin B-12: 340 pg/mL (ref 180–914)

## 2019-08-28 LAB — GLUCOSE, CAPILLARY
Glucose-Capillary: 103 mg/dL — ABNORMAL HIGH (ref 70–99)
Glucose-Capillary: 117 mg/dL — ABNORMAL HIGH (ref 70–99)
Glucose-Capillary: 118 mg/dL — ABNORMAL HIGH (ref 70–99)
Glucose-Capillary: 184 mg/dL — ABNORMAL HIGH (ref 70–99)
Glucose-Capillary: 299 mg/dL — ABNORMAL HIGH (ref 70–99)
Glucose-Capillary: 92 mg/dL (ref 70–99)

## 2019-08-28 LAB — POTASSIUM: Potassium: 4.1 mmol/L (ref 3.5–5.1)

## 2019-08-28 LAB — APTT: aPTT: 135 seconds — ABNORMAL HIGH (ref 24–36)

## 2019-08-28 LAB — HIV ANTIBODY (ROUTINE TESTING W REFLEX): HIV Screen 4th Generation wRfx: NONREACTIVE

## 2019-08-28 LAB — HEPARIN LEVEL (UNFRACTIONATED): Heparin Unfractionated: <2.2 IU/mL — ABNORMAL HIGH (ref 0.30–0.70)

## 2019-08-28 LAB — TSH: TSH: 2.818 u[IU]/mL (ref 0.350–4.500)

## 2019-08-28 LAB — RPR: RPR Ser Ql: NONREACTIVE

## 2019-08-28 MED ORDER — ACETAMINOPHEN 325 MG PO TABS: 650.0000 mg | ORAL_TABLET | Freq: Four times a day (QID) | ORAL | Status: DC | PRN

## 2019-08-28 MED ORDER — DOXERCALCIFEROL 4 MCG/2ML IV SOLN
INTRAVENOUS | Status: AC
  Administered 2019-08-28: 1 ug via INTRAVENOUS
  Filled 2019-08-28: qty 2

## 2019-08-28 MED ORDER — SODIUM ZIRCONIUM CYCLOSILICATE 10 G PO PACK
10.0000 g | PACK | Freq: Once | ORAL | Status: AC
  Administered 2019-08-28: 10 g via ORAL
  Filled 2019-08-28: qty 1

## 2019-08-28 MED ORDER — CHLORHEXIDINE GLUCONATE CLOTH 2 % EX PADS
6.0000 | MEDICATED_PAD | Freq: Every day | CUTANEOUS | Status: DC
  Administered 2019-08-29: 6 via TOPICAL

## 2019-08-28 MED ORDER — TRAMADOL HCL 50 MG PO TABS
50.0000 mg | ORAL_TABLET | Freq: Four times a day (QID) | ORAL | Status: DC | PRN
  Administered 2019-08-28: 50 mg via ORAL
  Filled 2019-08-28: qty 1

## 2019-08-28 MED ORDER — PANTOPRAZOLE SODIUM 40 MG PO TBEC
40.0000 mg | DELAYED_RELEASE_TABLET | Freq: Every day | ORAL | Status: DC
  Administered 2019-08-28 – 2019-08-29 (×2): 40 mg via ORAL
  Filled 2019-08-28 (×2): qty 1

## 2019-08-28 MED ORDER — DARBEPOETIN ALFA 40 MCG/0.4ML IJ SOSY: 40.0000 ug | PREFILLED_SYRINGE | INTRAMUSCULAR | Status: DC

## 2019-08-28 MED ORDER — DOXERCALCIFEROL 4 MCG/2ML IV SOLN: 1.0000 ug | INTRAVENOUS | Status: DC

## 2019-08-28 MED ORDER — AMLODIPINE BESYLATE 10 MG PO TABS
10.0000 mg | ORAL_TABLET | Freq: Every day | ORAL | Status: DC
  Administered 2019-08-28: 10 mg via ORAL
  Filled 2019-08-28: qty 1

## 2019-08-28 MED ORDER — CINACALCET HCL 30 MG PO TABS: 30.0000 mg | ORAL_TABLET | ORAL | Status: DC

## 2019-08-28 MED ORDER — APIXABAN 5 MG PO TABS
5.0000 mg | ORAL_TABLET | Freq: Two times a day (BID) | ORAL | Status: DC
  Administered 2019-08-28 – 2019-08-29 (×2): 5 mg via ORAL
  Filled 2019-08-28 (×2): qty 1

## 2019-08-28 MED ORDER — INSULIN ASPART 100 UNIT/ML ~~LOC~~ SOLN
0.0000 [IU] | Freq: Three times a day (TID) | SUBCUTANEOUS | Status: DC
  Administered 2019-08-28: 17:00:00 3 [IU] via SUBCUTANEOUS

## 2019-08-28 MED ORDER — ATORVASTATIN CALCIUM 80 MG PO TABS
80.0000 mg | ORAL_TABLET | Freq: Every day | ORAL | Status: DC
  Administered 2019-08-28 – 2019-08-29 (×2): 80 mg via ORAL
  Filled 2019-08-28 (×2): qty 1

## 2019-08-28 MED ORDER — INSULIN ASPART 100 UNIT/ML ~~LOC~~ SOLN: 0.0000 [IU] | Freq: Every day | SUBCUTANEOUS | Status: DC

## 2019-08-28 MED ORDER — INSULIN GLARGINE 100 UNIT/ML ~~LOC~~ SOLN
10.0000 [IU] | Freq: Every day | SUBCUTANEOUS | Status: DC
  Administered 2019-08-28 – 2019-08-29 (×2): 10 [IU] via SUBCUTANEOUS
  Filled 2019-08-28 (×2): qty 0.1

## 2019-08-28 MED ORDER — HYDRALAZINE HCL 25 MG PO TABS
25.0000 mg | ORAL_TABLET | Freq: Three times a day (TID) | ORAL | Status: DC
  Administered 2019-08-28 – 2019-08-29 (×3): 25 mg via ORAL
  Filled 2019-08-28 (×3): qty 1

## 2019-08-28 MED ORDER — ISOSORBIDE MONONITRATE ER 60 MG PO TB24
60.0000 mg | ORAL_TABLET | Freq: Every day | ORAL | Status: DC
  Administered 2019-08-28 – 2019-08-29 (×2): 60 mg via ORAL
  Filled 2019-08-28 (×2): qty 1

## 2019-08-28 MED ORDER — CITALOPRAM HYDROBROMIDE 20 MG PO TABS
20.0000 mg | ORAL_TABLET | Freq: Every day | ORAL | Status: DC
  Administered 2019-08-28 – 2019-08-29 (×2): 20 mg via ORAL
  Filled 2019-08-28 (×2): qty 1

## 2019-08-28 MED ORDER — INSULIN ASPART 100 UNIT/ML ~~LOC~~ SOLN
2.0000 [IU] | Freq: Three times a day (TID) | SUBCUTANEOUS | Status: DC
  Administered 2019-08-28 – 2019-08-29 (×2): 2 [IU] via SUBCUTANEOUS

## 2019-08-28 MED ORDER — CINACALCET HCL 30 MG PO TABS
ORAL_TABLET | ORAL | Status: AC
  Administered 2019-08-28: 30 mg via ORAL
  Filled 2019-08-28: qty 1

## 2019-08-28 MED ORDER — SODIUM CHLORIDE 0.9 % IV SOLN: 62.5000 mg | INTRAVENOUS | Status: DC

## 2019-08-28 NOTE — Progress Notes (Signed)
ANTICOAGULATION CONSULT NOTE - Follow Up Consult  Pharmacy Consult for heparin >> Eliquis Indication: atrial fibrillation  Labs: Recent Labs    08/27/19 1320 08/27/19 1328 08/27/19 1546 08/28/19 0340  HGB 10.8* 11.9*  --  10.1*  HCT 33.1* 35.0*  --  30.9*  PLT 210  --   --  195  APTT 35  --   --  135*  LABPROT 16.6*  --   --   --   INR 1.4*  --   --   --   HEPARINUNFRC  --   --   --  <2.20*  CREATININE 9.18* 9.50*  --  10.59*  TROPONINIHS 74*  --  65*  --     Assessment: 63yo male who presented 08/27/19 with altered mental status and home Eliquis was held. Patient was switched to IV Heparin. Now mental status has improved significantly and Dr. Lonny Prude has consulted pharmacy to resume Eliquis.   CT of Head and MRI on admission were negative.  Hgb 10.1, Platelets 195. No bleeding reported.  Patient has end stage renal disease receiving HD on Monday, Wednesday, and Friday.  Goal of Therapy:  Monitor platelets  Plan:  Resume Eliquis 5mg  po BID. Discontinue IV Heparin at the same time the first dose of Eliquis is administered.  Discontinue Heparin labs. Monitor CBC.  Sloan Leiter, PharmD, BCPS, BCCCP Clinical Pharmacist Please refer to [facility] for [place] numbers 08/28/2019,3:12 PM

## 2019-08-28 NOTE — Progress Notes (Signed)
[facility]  PROGRESS NOTE  Kenneth [name] [id number] DOB: 1956/06/28 DOA: 08/27/2019 PCP: Kenneth Holstein, MD Admit date - 08/27/2019   Admitting Physician Desiree Hane, MD  Outpatient Primary MD for the patient is Kenneth Holstein, MD  LOS - 1 Brief Narrative   Kenneth [name] is a 63 y.o. 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  Patient was unresponsive during evaluation in the ED.  History was provided by wife over phone with (interpreter):  The patient woke up this morning complaining of cold sweats. She checked his blood glucose which was 150 and his BP was 185/60. He continued to complain of dizziness, unsteadiness with ambulation and loss of strength. He eventually fell while walking and landed near the bathtub.  When she brought him downstairs, he started complaining of chest pain and generalized body aches with no clear radiation or alleviating or exacerbating factors. She denied any noted fevers or chills.  He continued to have chest pain until EMS arrived and obtained a blood pressure of 208/95.    He was seen by his PCP on 12/10 for back pain and prescribed dose pack of methyprednisolone and baclofen with noted improvement in back pain per wife.  Hypertension.  She states that he is adherent to his home regimen of blood pressure medications but chart review shows history of poor adherence  CAD s/p status post CABG with NSTEMI 06/2017, hx of severe instent restenosis 03/2018.     History of chronic atypical chest pain episodes,  Most recent admission  04/13/2019 with chest pain with mildly elevated troponin (166--153--139).  Cath at that time showed no interventional target with 40 to 50% in-stent restenosis.  At that time pain was treated medically with increase of Imdur to 90 mg daily   ED COURSE: Upon arrival to ED patient was lethargic and not responsive to verbal or painful stimuli initiating code stroke.  Neurology was consulted, patient had inconsistent physical exam unclear if degree of embellishment versus malingering.  Patient underwent stat CT head, CT head neck, MRI brain all negative for acute CVA.  Neurology recommended EEG and close monitoring the stepdown unit given mental status of unclear etiology.  Blood pressure elevated at 186/75, patient afebrile.  Initial high-sensitivity troponin of 74 down trended within 2 hours to 65.  Patient was Covid negative. Lab work notable for INR 1.4, unremarkable changes from CBC or CMP otherwise. Chest x-ray with cardiomegaly without acute findings.  X-ray of abdomen and pelvis with no acute abnormalities  TRH service was called for further management and admission   Subjective  Kenneth [name] today has some mild chest wall tenderness, and reports weakness and lower back pain. A & P   Altered mental status, significantly improved. More likely related to poor tolerance of recently started baclofen/steroids for chronic back pain given current improvement in mental status. Initial concern for acute stroke given elevated BP and admitting physical exam.  Work-up for such included negative CT head/MRI brain and inconsistent neurologic exam by neurology consultants.  -Would not resume baclofen, steroids -Avoid sedatives -Will not resume baclofen,  gabapentin -Continue neurochecks  Acute on chronic back pain.  Stable Extremities, has no overt focal deficits.  Lumbar x-ray shows signs of chronic compression.  CTA chest abdomen pelvis negative for aortic dissection -Consider further imaging if pain  persist despite supportive care -Warm compresses, Tylenol.,  Avoid sedatives due to above  Chest wall tenderness.  Initial concern for ACS however troponin trend unremarkable, occurred in the setting of fall, reproducible on exam -Supportive care, close monitoring  ESRD on HD Monday Wednesday Friday with hyperkalemia. -HD per nephrology  Hypertension, not at goal.  BP elevated into the 160s.  Hypertensive emergency on admission -Can resume home BP medicines given improvement in mentation -Amlodipine, losartan, hydralazine.  Paroxysmal atrial fibrillation, currently rate controlled in normal sinus rhythm.  Chads vas 6 -On admission IV heparin due to poor mentation -Resume home Eliquis  Ischemic cardiomyopathy.  Preserved EF on most recent TTE.  Euvolemic on exam. -Daily weights, monitor volume status.  Type 2 diabetes, uncontrolled with peripheral neuropathy.  A1c 7% (03/2019) -Lantus, insulin as needed, monitor CBGs -Hold gabapentin  Mood disorder, stable - home Celexa  Hyperlipidemia, stable -Home Lipitor      Family Communication  : We will update wife  Code Status : Full code  Disposition Plan  : Continue close monitoring neurologic status, potassium, back pain  Consults  : Nephrology  Procedures  : None  DVT Prophylaxis  : Heparin  Lab Results  Component Value Date   PLT 195 08/28/2019    Diet :  Diet Order            Diet renal with fluid restriction Fluid restriction: 1200 mL Fluid; Room service appropriate? Yes; Fluid consistency: Thin  Diet effective now               Inpatient Medications Scheduled Meds: . amLODipine  10 mg Oral QHS  . Chlorhexidine Gluconate Cloth  6 each Topical Q0600  . cinacalcet  30 mg Oral Q M,W,F-HD  . [START ON 09/04/2019] darbepoetin (ARANESP) injection - DIALYSIS  40 mcg Intravenous Q Mon-HD  . doxercalciferol  1 mcg Intravenous Q M,W,F-HD  . hydrALAZINE  25 mg Oral Q8H  . insulin aspart  0-6 Units Subcutaneous  Q4H   Continuous Infusions: . [START ON 08/30/2019] ferric gluconate (FERRLECIT/NULECIT) IV    . heparin 900 Units/hr (08/28/19 0628)   PRN Meds:.acetaminophen, hydrALAZINE, traMADol  Antibiotics  :   Anti-infectives (From admission, onward)   None       Objective   Vitals:   08/28/19 1230 08/28/19 1300 08/28/19 1330 08/28/19 1441  BP: 138/66 (!) 171/118 (!) 150/66 (!) 166/66  Pulse: (!) 59 60 70 72  Resp:    20  Temp:    98.2 F (36.8 C)  TempSrc:    Oral  SpO2:    98%  Weight:      Height:        SpO2: 98 % O2 Flow Rate (L/min): 2 L/min  Wt Readings from Last 3 Encounters:  08/28/19 65.8 kg  08/22/19 67.1 kg  07/27/19 67.2 kg     Intake/Output Summary (Last 24 hours) at 08/28/2019 1452 Last data filed at 08/28/2019 [id number] Gross per 24 hour  Intake 33.99 ml  Output --  Net 33.99 ml    Physical Exam:  Lying in bed, comfortably Awake Alert, speaking delayed, oriented to person, place, time, able to give history of how he felt prior to admission Moving all extremities against gravity without deficits, following  commands [place].AT, No JVD Symmetrical Chest wall movement, Good air movement bilaterally, CTAB RRR,No Gallops,Rubs or new Murmurs, Positive left-sided chest wall tenderness on palpation +ve B.Sounds, Abd Soft, No tenderness, No organomegaly appreciated, No rebound, guarding or rigidity. Paraspinal lumbar tenderness Right foot dressed  I have personally reviewed the following:   Data Reviewed:  CBC Recent Labs  Lab 08/27/19 1320 08/27/19 1328 08/28/19 0340  WBC 9.9  --  9.0  HGB 10.8* 11.9* 10.1*  HCT 33.1* 35.0* 30.9*  PLT 210  --  195  MCV 97.1  --  97.2  MCH 31.7  --  31.8  MCHC 32.6  --  32.7  RDW 15.2  --  15.7*  LYMPHSABS 1.2  --   --   MONOABS 0.9  --   --   EOSABS 0.0  --   --   BASOSABS 0.0  --   --     Chemistries  Recent Labs  Lab 08/27/19 1320 08/27/19 1328 08/28/19 0340  NA 137 136 133*  K 5.1 5.1 6.1*  CL 96*  99 94*  CO2 24  --  22  GLUCOSE 138* 133* 124*  BUN 47* 49* 53*  CREATININE 9.18* 9.50* 10.59*  CALCIUM 9.9  --  9.3  AST 18  --  16  ALT 21  --  17  ALKPHOS 72  --  67  BILITOT 1.1  --  0.9   ------------------------------------------------------------------------------------------------------------------ No results for input(s): CHOL, HDL, LDLCALC, TRIG, CHOLHDL, LDLDIRECT in the last 72 hours.  Lab Results  Component Value Date   HGBA1C 6.8 (H) 08/27/2019   ------------------------------------------------------------------------------------------------------------------ Recent Labs    08/27/19 2241  TSH 2.818   ------------------------------------------------------------------------------------------------------------------ Recent Labs    08/27/19 2241  VITAMINB12 340    Coagulation profile Recent Labs  Lab 08/27/19 1320  INR 1.4*    No results for input(s): DDIMER in the last 72 hours.  Cardiac Enzymes No results for input(s): CKMB, TROPONINI, MYOGLOBIN in the last 168 hours.  Invalid input(s): CK ------------------------------------------------------------------------------------------------------------------    Component Value Date/Time   BNP 1,423.3 (H) 05/04/2018 1515    Micro Results Recent Results (from the past 240 hour(s))  Respiratory Panel by RT PCR (Flu A&B, Covid) - Nasopharyngeal Swab     Status: None   Collection Time: 08/27/19  2:39 PM   Specimen: Nasopharyngeal Swab  Result Value Ref Range Status   SARS Coronavirus 2 by RT PCR NEGATIVE NEGATIVE Final    Comment: (NOTE) SARS-CoV-2 target nucleic acids are NOT DETECTED. The SARS-CoV-2 RNA is generally detectable in upper respiratoy specimens during the acute phase of infection. The lowest concentration of SARS-CoV-2 viral copies this assay can detect is 131 copies/mL. A negative result does not preclude SARS-Cov-2 infection and should not be used as the sole basis for treatment  or other patient management decisions. A negative result may occur with  improper specimen collection/handling, submission of specimen other than nasopharyngeal swab, presence of viral mutation(s) within the areas targeted by this assay, and inadequate number of viral copies (<131 copies/mL). A negative result must be combined with clinical observations, patient history, and epidemiological information. The expected result is Negative. Fact Sheet for Patients:  PinkCheek.be Fact Sheet for Healthcare Providers:  GravelBags.it This test is not yet ap proved or cleared by the [place] FDA and  has been authorized for detection and/or diagnosis of SARS-CoV-2 by FDA under an Emergency Use Authorization (EUA). This EUA will remain  in effect (meaning this test can  be used) for the duration of the COVID-19 declaration under Section 564(b)(1) of the Act, 21 U.S.C. section 360bbb-3(b)(1), unless the authorization is terminated or revoked sooner.    Influenza A by PCR NEGATIVE NEGATIVE Final   Influenza B by PCR NEGATIVE NEGATIVE Final    Comment: (NOTE) The Xpert Xpress SARS-CoV-2/FLU/RSV assay is intended as an aid in  the diagnosis of influenza from Nasopharyngeal swab specimens and  should not be used as a sole basis for treatment. Nasal washings and  aspirates are unacceptable for Xpert Xpress SARS-CoV-2/FLU/RSV  testing. Fact Sheet for Patients: PinkCheek.be Fact Sheet for Healthcare Providers: GravelBags.it This test is not yet approved or cleared by the [place] FDA and  has been authorized for detection and/or diagnosis of SARS-CoV-2 by  FDA under an Emergency Use Authorization (EUA). This EUA will remain  in effect (meaning this test can be used) for the duration of the  Covid-19 declaration under Section 564(b)(1) of the Act, 21  U.S.C. section  360bbb-3(b)(1), unless the authorization is  terminated or revoked. Performed at [facility], [place] [address] [place],  [address]     Radiology Reports CT Code Stroke CTA Head W/WO contrast  Result Date: 08/27/2019 CLINICAL DATA:  63 year old male unresponsive. Scattered small age indeterminate infarcts in the left MCA territory on plain CT today. EXAM: CT ANGIOGRAPHY HEAD AND NECK CT PERFUSION BRAIN TECHNIQUE: Multidetector CT imaging of the head and neck was performed using the standard protocol during bolus administration of intravenous contrast. Multiplanar CT image reconstructions and MIPs were obtained to evaluate the vascular anatomy. Carotid stenosis measurements (when applicable) are obtained utilizing NASCET criteria, using the distal internal carotid diameter as the denominator. Multiphase CT imaging of the brain was performed following IV bolus contrast injection. Subsequent parametric perfusion maps were calculated using RAPID software. CONTRAST:  115mL OMNIPAQUE IOHEXOL 350 MG/ML SOLN COMPARISON:  Plain CT 1328 hours. CTA head and neck 719 04/01/2018 FINDINGS: CT Brain Perfusion Findings: CBF (<30%) Volume: None Perfusion (Tmax>6.0s) volume: None Mismatch Volume: Not applicable Infarction Location:Not applicable CTA NECK Skeleton: No acute osseous abnormality identified. Prior sternotomy. Upper chest: Apical and upper lobe paraseptal and centrilobular emphysema. No superior mediastinal lymphadenopathy. Other neck: No acute findings. Aortic arch: Calcified aortic atherosclerosis. 3 vessel arch configuration. Right carotid system: No brachiocephalic artery stenosis despite plaque. Normal right CCA origin. Mild plaque proximal to the bifurcation without stenosis. Minimal plaque at the right ICA origin without stenosis. Left carotid system: No left CCA origin stenosis despite plaque. Mild additional plaque proximal to the bifurcation. Calcified plaque at the left ICA origin and  bulb with less than 50% stenosis with respect to the distal vessel. Vertebral arteries: No proximal right subclavian artery stenosis despite plaque. 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 Soft and calcified plaque in the proximal left subclavian artery without significant stenosis. Calcified plaque at the left vertebral artery origin with only mild stenosis (series 6, image 122). The left vertebral remains patent to the skull base without stenosis. CTA HEAD Posterior circulation: Diminutive and poorly enhancing right Vertebral artery V4 segment is stable. The left V4 remains patent and supplies the basilar without stenosis. Patent left PICA origin. Patent basilar artery with stable appearance since 2019. Patent SCA origins. Fetal type bilateral PCA origins again noted. Bilateral PCA branches are stable with mild irregularity. Anterior circulation: Both ICA siphons are patent. Both supraclinoid segments are heavily calcified and appear stable since 2019. On the left there is  moderate associated stenosis. On the right there is mild to moderate stenosis. Patent carotid termini. MCA and ACA origins are stable and patent. Diminutive anterior communicating artery. Bilateral ACA branches are within normal limits. Left MCA M1 segment and bifurcation appear stable and patent without stenosis. Right MCA M1 segment and bifurcation appear stable and patent. Visible bilateral MCA branches are stable since 2019. Venous sinuses: Patent. Anatomic variants: Fetal type bilateral PCA origins. Review of the MIP images confirms the above findings IMPRESSION: 1. Negative for emergent large vessel occlusion. Patent basilar artery and unchanged posterior circulation since 2019. 2. CT Perfusion does not detect ischemic penumbra or core infarct. 3. Positive for chronic occlusion of the right vertebral artery, unchanged. 4. Positive also for chronic calcified ICA siphons with moderate left and mild  to moderate right supraclinoid stenosis. 5. Aortic Atherosclerosis (ICD10-I70.0) and Emphysema (ICD10-J43.9). These results were communicated to Dr. Cheral Marker at 2:08 pm on 08/27/2019 by text page via the [facility] messaging system. Electronically Signed   By: Genevie Ann M.D.   On: 08/27/2019 14:08   DG Lumbar Spine 2-3 Views  Result Date: 08/28/2019 CLINICAL DATA:  Back pain, fall EXAM: LUMBAR SPINE - 2-3 VIEW COMPARISON:  None. FINDINGS: There is slight anterior wedge compression deformities of the T11 through L1 vertebral bodies with less than 25% loss in height. These appear to be chronic and were partially visualized on prior exam dating back to 20/10. There is disc height loss and anterior osteophytes most notable at L1 through L3. There is a minimal levoconvex scoliotic curvature. There is diffuse osteopenia. No definite acute fracture is seen. Dense vascular calcifications are noted. IMPRESSION: Chronic slight anterior wedge compression deformities of T11 through L1 with less than 25% loss of height. Mild levoconvex scoliotic curvature. Diffuse osteopenia which limits evaluation, however no definite acute fracture is seen. If pain persists, would recommend cross-sectional imaging for further evaluation. Electronically Signed   By: Prudencio Pair M.D.   On: 08/28/2019 09:20   CT Code Stroke CTA Neck W/WO contrast  Result Date: 08/27/2019 CLINICAL DATA:  63 year old male unresponsive. Scattered small age indeterminate infarcts in the left MCA territory on plain CT today. EXAM: CT ANGIOGRAPHY HEAD AND NECK CT PERFUSION BRAIN TECHNIQUE: Multidetector CT imaging of the head and neck was performed using the standard protocol during bolus administration of intravenous contrast. Multiplanar CT image reconstructions and MIPs were obtained to evaluate the vascular anatomy. Carotid stenosis measurements (when applicable) are obtained utilizing NASCET criteria, using the distal internal carotid diameter as the  denominator. Multiphase CT imaging of the brain was performed following IV bolus contrast injection. Subsequent parametric perfusion maps were calculated using RAPID software. CONTRAST:  113mL OMNIPAQUE IOHEXOL 350 MG/ML SOLN COMPARISON:  Plain CT 1328 hours. CTA head and neck 719 04/01/2018 FINDINGS: CT Brain Perfusion Findings: CBF (<30%) Volume: None Perfusion (Tmax>6.0s) volume: None Mismatch Volume: Not applicable Infarction Location:Not applicable CTA NECK Skeleton: No acute osseous abnormality identified. Prior sternotomy. Upper chest: Apical and upper lobe paraseptal and centrilobular emphysema. No superior mediastinal lymphadenopathy. Other neck: No acute findings. Aortic arch: Calcified aortic atherosclerosis. 3 vessel arch configuration. Right carotid system: No brachiocephalic artery stenosis despite plaque. Normal right CCA origin. Mild plaque proximal to the bifurcation without stenosis. Minimal plaque at the right ICA origin without stenosis. Left carotid system: No left CCA origin stenosis despite plaque. Mild additional plaque proximal to the bifurcation. Calcified plaque at the left ICA origin and bulb with less than 50% stenosis with respect to  the distal vessel. Vertebral arteries: No proximal right subclavian artery stenosis despite plaque. 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 Soft and calcified plaque in the proximal left subclavian artery without significant stenosis. Calcified plaque at the left vertebral artery origin with only mild stenosis (series 6, image 122). The left vertebral remains patent to the skull base without stenosis. CTA HEAD Posterior circulation: Diminutive and poorly enhancing right Vertebral artery V4 segment is stable. The left V4 remains patent and supplies the basilar without stenosis. Patent left PICA origin. Patent basilar artery with stable appearance since 2019. Patent SCA origins. Fetal type bilateral PCA origins again  noted. Bilateral PCA branches are stable with mild irregularity. Anterior circulation: Both ICA siphons are patent. Both supraclinoid segments are heavily calcified and appear stable since 2019. On the left there is moderate associated stenosis. On the right there is mild to moderate stenosis. Patent carotid termini. MCA and ACA origins are stable and patent. Diminutive anterior communicating artery. Bilateral ACA branches are within normal limits. Left MCA M1 segment and bifurcation appear stable and patent without stenosis. Right MCA M1 segment and bifurcation appear stable and patent. Visible bilateral MCA branches are stable since 2019. Venous sinuses: Patent. Anatomic variants: Fetal type bilateral PCA origins. Review of the MIP images confirms the above findings IMPRESSION: 1. Negative for emergent large vessel occlusion. Patent basilar artery and unchanged posterior circulation since 2019. 2. CT Perfusion does not detect ischemic penumbra or core infarct. 3. Positive for chronic occlusion of the right vertebral artery, unchanged. 4. Positive also for chronic calcified ICA siphons with moderate left and mild to moderate right supraclinoid stenosis. 5. Aortic Atherosclerosis (ICD10-I70.0) and Emphysema (ICD10-J43.9). These results were communicated to Dr. Cheral Marker at 2:08 pm on 08/27/2019 by text page via the [facility] messaging system. Electronically Signed   By: Genevie Ann M.D.   On: 08/27/2019 14:08   MR BRAIN WO CONTRAST  Result Date: 08/27/2019 CLINICAL DATA:  Neuro deficit acute.  Rule out stroke.  ESRD. EXAM: MRI HEAD WITHOUT CONTRAST TECHNIQUE: Multiplanar, multiecho pulse sequences of the brain and surrounding structures were obtained without intravenous contrast. COMPARISON:  MRI head 04/01/2018 FINDINGS: Brain: Negative for acute infarct. Mild white matter changes consistent with chronic microvascular ischemia. Negative for hemorrhage or mass. Vascular: Normal arterial flow voids. Skull and upper  cervical spine: No focal skeletal lesion. Sinuses/Orbits: Mild mucosal edema paranasal sinuses. Bilateral cataract surgery Other: None IMPRESSION: No acute abnormality. Mild chronic microvascular ischemic change in the white matter is stable from the prior study. Electronically Signed   By: Franchot Gallo M.D.   On: 08/27/2019 16:41   DG Pelvis Portable  Result Date: 08/27/2019 CLINICAL DATA:  Evaluation for metallic devices prior to MRI a EXAM: PORTABLE PELVIS 1-2 VIEWS COMPARISON:  None. FINDINGS: There are metallic devices overlying the right lower quadrant just above the inguinal ligament and overlying the left lower quadrant just above the left iliac crest, both of which are probably extrinsic to the patient. There are safety pins overlying the pelvis as well. IMPRESSION: Metallic foreign bodies overlying the pelvis as described including safety pins and 2 devices which are likely extrinsic to the patient. Electronically Signed   By: Lorriane Shire M.D.   On: 08/27/2019 14:54   CT Code Stroke Cerebral Perfusion with contrast  Result Date: 08/27/2019 CLINICAL DATA:  63 year old male unresponsive. Scattered small age indeterminate infarcts in the left MCA territory on plain CT today. EXAM: CT ANGIOGRAPHY HEAD AND NECK CT  PERFUSION BRAIN TECHNIQUE: Multidetector CT imaging of the head and neck was performed using the standard protocol during bolus administration of intravenous contrast. Multiplanar CT image reconstructions and MIPs were obtained to evaluate the vascular anatomy. Carotid stenosis measurements (when applicable) are obtained utilizing NASCET criteria, using the distal internal carotid diameter as the denominator. Multiphase CT imaging of the brain was performed following IV bolus contrast injection. Subsequent parametric perfusion maps were calculated using RAPID software. CONTRAST:  172mL OMNIPAQUE IOHEXOL 350 MG/ML SOLN COMPARISON:  Plain CT 1328 hours. CTA head and neck 719 04/01/2018  FINDINGS: CT Brain Perfusion Findings: CBF (<30%) Volume: None Perfusion (Tmax>6.0s) volume: None Mismatch Volume: Not applicable Infarction Location:Not applicable CTA NECK Skeleton: No acute osseous abnormality identified. Prior sternotomy. Upper chest: Apical and upper lobe paraseptal and centrilobular emphysema. No superior mediastinal lymphadenopathy. Other neck: No acute findings. Aortic arch: Calcified aortic atherosclerosis. 3 vessel arch configuration. Right carotid system: No brachiocephalic artery stenosis despite plaque. Normal right CCA origin. Mild plaque proximal to the bifurcation without stenosis. Minimal plaque at the right ICA origin without stenosis. Left carotid system: No left CCA origin stenosis despite plaque. Mild additional plaque proximal to the bifurcation. Calcified plaque at the left ICA origin and bulb with less than 50% stenosis with respect to the distal vessel. Vertebral arteries: No proximal right subclavian artery stenosis despite plaque. 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 Soft and calcified plaque in the proximal left subclavian artery without significant stenosis. Calcified plaque at the left vertebral artery origin with only mild stenosis (series 6, image 122). The left vertebral remains patent to the skull base without stenosis. CTA HEAD Posterior circulation: Diminutive and poorly enhancing right Vertebral artery V4 segment is stable. The left V4 remains patent and supplies the basilar without stenosis. Patent left PICA origin. Patent basilar artery with stable appearance since 2019. Patent SCA origins. Fetal type bilateral PCA origins again noted. Bilateral PCA branches are stable with mild irregularity. Anterior circulation: Both ICA siphons are patent. Both supraclinoid segments are heavily calcified and appear stable since 2019. On the left there is moderate associated stenosis. On the right there is mild to moderate  stenosis. Patent carotid termini. MCA and ACA origins are stable and patent. Diminutive anterior communicating artery. Bilateral ACA branches are within normal limits. Left MCA M1 segment and bifurcation appear stable and patent without stenosis. Right MCA M1 segment and bifurcation appear stable and patent. Visible bilateral MCA branches are stable since 2019. Venous sinuses: Patent. Anatomic variants: Fetal type bilateral PCA origins. Review of the MIP images confirms the above findings IMPRESSION: 1. Negative for emergent large vessel occlusion. Patent basilar artery and unchanged posterior circulation since 2019. 2. CT Perfusion does not detect ischemic penumbra or core infarct. 3. Positive for chronic occlusion of the right vertebral artery, unchanged. 4. Positive also for chronic calcified ICA siphons with moderate left and mild to moderate right supraclinoid stenosis. 5. Aortic Atherosclerosis (ICD10-I70.0) and Emphysema (ICD10-J43.9). These results were communicated to Dr. Cheral Marker at 2:08 pm on 08/27/2019 by text page via the [facility] messaging system. Electronically Signed   By: Genevie Ann M.D.   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 Both lungs are clear. The visualized skeletal structures are unremarkable. IMPRESSION: Cardiomegaly without acute abnormality of the lungs in AP portable projection Electronically  Signed   By: Eddie Candle M.D.   On: 08/27/2019 14:50   DG Abd Portable 1 View  Result Date: 08/27/2019 CLINICAL DATA:  Checking for devices prior to MRI. EXAM: PORTABLE ABDOMEN - 1 VIEW COMPARISON:  Abdominal radiograph dated 06/08/2016 FINDINGS: There is a complex metallic device overlying the right lower quadrant which is likely extrinsic to the patient. There is a circular and a metallic device overlying the left  lower quadrant which is also likely extrinsic to the safety pins also overlie the right and left sides of the lower abdomen. Bowel gas pattern is normal. No acute bone abnormality. IMPRESSION: 1. Benign-appearing abdomen. 2. Metallic foreign bodies overlying the right lower quadrant and left lower quadrant. These are both likely extrinsic to the patient. Safety pins are also present. Electronically Signed   By: Lorriane Shire M.D.   On: 08/27/2019 14:51   EEG adult  Result Date: 08/27/2019 Lora Havens, MD     08/27/2019  6:22 PM Patient Name: KEISON SIMICH MRN: [id number] Epilepsy Attending: Lora Havens Referring Physician/Provider: Laurey Morale, NP Date: 08/27/2019 Duration: 30.37mins Patient history: 63yo F with sudden onset of ams. EEG to evaluate for seizure. Level of alertness: awake, asleep AEDs during EEG study: None Technical aspects: This EEG study was done with scalp electrodes positioned according to the 10-20 International system of electrode placement. Electrical activity was acquired at a sampling rate of 500Hz  and reviewed with a high frequency filter of 70Hz  and a low frequency filter of 1Hz . EEG data were recorded continuously and digitally stored. DESCRIPTION: During awake state, the posterior dominant rhythm consists of 8-9 Hz activity of moderate voltage (25-35 uV) seen predominantly in posterior head regions, symmetric and reactive to eye opening and eye closing. Sleep was characterized by sleep spindles (12-14Hz ), maximal frontocentral, and intermittent generalized 2-3Hz  delta slowing.       Hyperventilation and photic stimulation were not performed. IMPRESSION: This study is within normal limits. No seizures or epileptiform discharges were seen throughout the recording. Lora Havens   CT Angio Chest/Abd/Pel for Dissection W and/or W/WO  Result Date: 08/27/2019 CLINICAL DATA:  Acute chest and back pain with hypertension EXAM: CT ANGIOGRAPHY CHEST, ABDOMEN AND PELVIS  TECHNIQUE: Multidetector CT imaging through the chest, abdomen and pelvis was performed using the standard protocol during bolus administration of intravenous contrast. Multiplanar reconstructed images and MIPs were obtained and reviewed to evaluate the vascular anatomy. CONTRAST:  155mL OMNIPAQUE IOHEXOL 350 MG/ML SOLN COMPARISON:  None. FINDINGS: CTA CHEST FINDINGS Cardiovascular: Initial precontrast images demonstrate no hyperdense crescent within the thoracic aorta. The thoracic aorta and its branches demonstrate atherosclerotic calcifications and changes of prior coronary bypass grafting. No evidence of aneurysmal dilatation or dissection is seen. Heavy coronary calcifications are noted. The heart is mildly enlarged in size. The pulmonary artery is well visualized within normal branching pattern. No focal filling defect to suggest pulmonary embolism is seen. Mediastinum/Nodes: Thoracic inlet is within normal limits. No hilar or mediastinal adenopathy is seen. The esophagus as visualized is within normal limits. Lungs/Pleura: Emphysematous changes are noted in the lungs. Some mild scarring is noted in the right lung apex. No focal infiltrate is seen. Small effusions are noted bilaterally. No sizable parenchymal nodules are seen. Musculoskeletal: The bony structures show degenerative change of the thoracic spine. No acute bony abnormality is noted. Review of the MIP images confirms the above findings. CTA ABDOMEN AND PELVIS FINDINGS VASCULAR Aorta: Abdominal aorta demonstrates atherosclerotic calcifications. No aneurysmal dilatation  or dissection is seen. Celiac: Patent without evidence of aneurysm, dissection, vasculitis or significant stenosis. SMA: Patent without evidence of aneurysm, dissection, vasculitis or significant stenosis. Renals: Both renal arteries are patent without evidence of aneurysm, dissection, vasculitis, fibromuscular dysplasia or significant stenosis. IMA: Patent without evidence of  aneurysm, dissection, vasculitis or significant stenosis. Iliacs: Iliac arteries demonstrate diffuse calcification without aneurysmal dilatation or dissection. No focal hemodynamically significant stenosis is noted. Veins: No specific vein abnormality is noted. Review of the MIP images confirms the above findings. NON-VASCULAR Hepatobiliary: Liver is well visualized and within normal limits. Gallbladder demonstrates multiple dependent gallstones without obstructive change. Pancreas: Unremarkable. No pancreatic ductal dilatation or surrounding inflammatory changes. Spleen: Normal in size without focal abnormality. Adrenals/Urinary Tract: Adrenal glands are within normal limits. Kidneys are well visualized bilaterally. No calculi or obstructive changes are noted. 17 mm left upper pole renal cyst is noted. Normal enhancement is seen bilaterally. The bladder is partially distended with opacified and unopacified urine. Stomach/Bowel: The appendix is within normal limits. No obstructive or inflammatory changes of the colon are seen. No small bowel or gastric abnormality is noted. Lymphatic: No significant lymphadenopathy is seen. Reproductive: Prostate is unremarkable. Other: No abdominal wall hernia or abnormality. No abdominopelvic ascites. Musculoskeletal: Degenerative changes of the lumbar spine seen. Review of the MIP images confirms the above findings. IMPRESSION: No evidence of aortic dilatation or dissection. No evidence of pulmonary emboli. Cholelithiasis without complicating factors. Chronic changes in the chest and abdomen without acute abnormality. Electronically Signed   By: Inez Catalina M.D.   On: 08/27/2019 19:52   CT HEAD CODE STROKE WO CONTRAST  Result Date: 08/27/2019 CLINICAL DATA:  Code stroke. 63 year old male is unresponsive. Weakness. EXAM: CT HEAD WITHOUT CONTRAST TECHNIQUE: Contiguous axial images were obtained from the base of the skull through the vertex without intravenous contrast.  COMPARISON:  Head CT 04/01/2018. Brain MRI 04/01/2018. FINDINGS: Brain: Stable cerebral volume since 2019. No midline shift, ventriculomegaly, mass effect, evidence of mass lesion, intracranial hemorrhage or evidence of cortically based acute infarction. Patchy periventricular white matter hypodensity appears stable. Subtle new cortical hypodensity in the anterior and posterior left MCA territory seen on series 3, image 22 and sagittal image 42. There is also a small new round hypodensity in the left posterior lentiform on image 18. But no other CT changes of acute ischemia identified. Vascular: Calcified atherosclerosis at the skull base. No suspicious intracranial vascular hyperdensity. Skull: No acute osseous abnormality identified. Sinuses/Orbits: Visualized paranasal sinuses and mastoids are stable and well pneumatized. Other: No acute orbit or scalp soft tissue findings. There is some calcified scalp vessel atherosclerosis. ASPECTS [facility] Stroke Program Early CT Score) Total score (0-10 with 10 being normal): 7; given the scattered distribution of the 3 small hypodense areas in the left MCA territory. IMPRESSION: 1. 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 No associated hemorrhage or mass effect. 2. Elsewhere stable non contrast CT appearance of the brain since 2019. 3. Given the distribution of lesions in #1 ASPECTS would be 7. 4. These results were communicated to Dr. Cheral Marker at 1:42 pm on 08/27/2019 by text page via the [facility] messaging system. Electronically Signed   By: Genevie Ann M.D.   On: 08/27/2019 13:42     Time Spent in minutes  30     Desiree Hane M.D on 08/28/2019 at 2:52 PM  To page go to [email or web address] - password  [email or web address]

## 2019-08-28 NOTE — Progress Notes (Signed)
ANTICOAGULATION CONSULT NOTE - Follow Up Consult  Pharmacy Consult for heparin Indication: atrial fibrillation  Labs: Recent Labs    08/27/19 1320 08/27/19 1328 08/27/19 1546 08/28/19 0340  HGB 10.8* 11.9*  --  10.1*  HCT 33.1* 35.0*  --  30.9*  PLT 210  --   --  195  APTT 35  --   --  135*  LABPROT 16.6*  --   --   --   INR 1.4*  --   --   --   HEPARINUNFRC  --   --   --  <2.20*  CREATININE 9.18* 9.50*  --  10.59*  TROPONINIHS 74*  --  65*  --     Assessment: 63yo male supratherapeutic on heparin with initial dosing while Eliquis on hold; no gtt issues or signs of bleeding per RN.  Goal of Therapy:  aPTT 66-102 seconds   Plan:  Will decrease heparin gtt by 2 units/kg/hr to 900 units/hr and check PTT in 8 hours.    Wynona Neat, PharmD, BCPS  08/28/2019,6:27 AM

## 2019-08-28 NOTE — Progress Notes (Signed)
Pt received from dialysis. 2L removed/pt completed 4 hr treatment. Vitals stable. Call bell within reach will continue to monitor. Jerald Kief, RN

## 2019-08-28 NOTE — Progress Notes (Signed)
Checked on patient d/t respirations dropping to 7. Patient was able to be aroused by shaking his right shoulder and saying patient's name. Patient woke up fairly easy for this RN. Patient then raised his left hand and stated that, "it's working!". Will continue to monitor

## 2019-08-28 NOTE — Progress Notes (Addendum)
Reason for consult:   Subjective: Patient is back to his baseline.  States he recalls events that brought him to the hospital, however patient had passed out and unresponsive per history.  Did complain that he felt very dizzy after taking medicines doctor prescribed him.   ROS: negative except above Examination  Vital signs in last 24 hours: Temp:  [97.5 F (36.4 C)-98.7 F (37.1 C)] 98.7 F (37.1 C) (12/14 2048) Pulse Rate:  [56-72] 60 (12/14 2048) Resp:  [11-29] 19 (12/14 2048) BP: (118-185)/(49-118) 142/65 (12/14 2048) SpO2:  [95 %-100 %] 99 % (12/14 2048) Weight:  [64.7 kg-67.8 kg] 64.7 kg (12/14 1344)   General: lying in bed CVS: pulse-normal rate and rhythm RS: breathing comfortably Extremities: normal   Neuro: MS: Alert, oriented, follows commands CN: pupils equal and reactive,  EOMI, face symmetric, tongue midline, normal sensation over face, Motor: 5/5 strength in all 4 extremities Reflexes: 2+ bilaterally over patella, biceps, plantars: flexor Coordination: normal Gait: not tested  Basic Metabolic Panel: Recent Labs  Lab 08/27/19 1320 08/27/19 1328 08/28/19 0340 08/28/19 1615  NA 137 136 133*  --   K 5.1 5.1 6.1* 4.1  CL 96* 99 94*  --   CO2 24  --  22  --   GLUCOSE 138* 133* 124*  --   BUN 47* 49* 53*  --   CREATININE 9.18* 9.50* 10.59*  --   CALCIUM 9.9  --  9.3  --     CBC: Recent Labs  Lab 08/27/19 1320 08/27/19 1328 08/28/19 0340  WBC 9.9  --  9.0  NEUTROABS 7.6  --   --   HGB 10.8* 11.9* 10.1*  HCT 33.1* 35.0* 30.9*  MCV 97.1  --  97.2  PLT 210  --  195     Coagulation Studies: Recent Labs    08/27/19 1320  LABPROT 16.6*  INR 1.4*    Imaging Reviewed:     ASSESSMENT AND PLAN  63 year old male with past medical history significant for coronary artery disease, A. fib on Eliquis, end-stage renal disease on hemodialysis, CHF, diabetes mellitus admitted to the hospital after having episode of unresponsiveness after complaining  of chest pain earlier in the morning.  Neurology was consulted after code stroke initiated in the ED.  Neurology evaluated patient he was still not back to his baseline.  Work-up included CTA which is negative for LVO, shows chronic right vertebral artery occlusion, basilr artery patent.   CT be negative or perfusion deficit.  Did not receive TPA due to being on Eliquis.  Routine EEG was also performed which was negative for epileptiform discharges.  MRI brain was also performed which is negative for acute findings.    Patient slowly became responsive and is back to his baseline today.  According to him and his wife, he was started on baclofen recently. Patient also states he felt significantly dizzy before passing out which would favor baclofen toxicity especially given end-stage renal disease.  Impression Acute toxic metabolic encephalopathy, less likely TIA vs seizure  Recommendations Limit/hold all sedating medications Continue Eliquis in the setting of A. fib for stroke prevention Avoid hypotension, BP goal less than [id number] systolic Patient had recent echo on 7/31 and no need to repeat Neurology will be available as needed.   Karena Addison Illianna Paschal [facility] Pager Number [phone] For questions after 7pm please refer to AMION to reach the Neurologist on call

## 2019-08-28 NOTE — Progress Notes (Signed)
Patient sitting up in bed watching TV and laughing.

## 2019-08-28 NOTE — Progress Notes (Addendum)
[facility]  PROGRESS NOTE  [name] [name] DOB: 15-Mar-1956 DOA: 08/27/2019 PCP: [name], MD Admit date - 08/27/2019   Admitting Physician Desiree Hane, MD  Outpatient Primary MD for the patient is [name], MD  LOS - 1 Brief Narrative   Kenneth [name] is a 63 y.o. 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  Patient was unresponsive during evaluation in the ED.  History was provided by wife over phone with (interpreter):  The patient woke up this morning complaining of cold sweats. She checked his blood glucose which was 150 and his BP was 185/60. He continued to complain of dizziness, unsteadiness with ambulation and loss of strength. He eventually fell while walking and landed near the bathtub.  When she brought him downstairs, he started complaining of chest pain and generalized body aches with no clear radiation or alleviating or exacerbating factors. She denied any noted fevers or chills.  He continued to have chest pain until EMS arrived and obtained a blood pressure of 208/95.    He was seen by his PCP on 12/10 for back pain and prescribed dose pack of methyprednisolone and baclofen with noted improvement in back pain per wife.  Hypertension.  She states that he is adherent to his home regimen of blood pressure medications but chart review shows history of poor adherence  CAD s/p status post CABG with NSTEMI 06/2017, hx of severe instent restenosis 03/2018.     History of chronic atypical chest pain episodes,  Most recent admission  04/13/2019 with chest pain with mildly elevated troponin (166--153--139).  Cath at that time showed no interventional target with 40 to 50% in-stent restenosis.  At that time pain was treated medically with increase of Imdur to 90 mg daily   ED COURSE: Upon arrival to ED patient was lethargic and not responsive to verbal or painful stimuli initiating code stroke.  Neurology was consulted, patient had inconsistent physical exam unclear if degree of embellishment versus malingering.  Patient underwent stat CT head, CT head neck, MRI brain all negative for acute CVA.  Neurology recommended EEG and close monitoring the stepdown unit given mental status of unclear etiology.  Blood pressure elevated at 186/75, patient afebrile.  Initial high-sensitivity troponin of 74 down trended within 2 hours to 65.  Patient was Covid negative. Lab work notable for INR 1.4, unremarkable changes from CBC or CMP otherwise. Chest x-ray with cardiomegaly without acute findings.  X-ray of abdomen and pelvis with no acute abnormalities  TRH service was called for further management and admission   Subjective  Kenneth [name] today has some mild chest wall tenderness, and reports weakness and lower back pain. A & P   Altered mental status, significantly improved. More likely related to poor tolerance of recently started baclofen/steroids for chronic back pain given current improvement in mental status. Initial concern for acute stroke given elevated BP and admitting physical exam.  Work-up for such included negative CT head/MRI brain and inconsistent neurologic exam by neurology consultants.  -Would not resume baclofen, steroids -Avoid sedatives -Will not resume baclofen,  gabapentin -Continue neurochecks  Acute on chronic back pain.  Stable Extremities, has no overt focal deficits.  Lumbar x-ray shows signs of chronic compression.  CTA chest abdomen pelvis negative for aortic dissection -Consider further imaging if pain  persist despite supportive care -Warm compresses, Tylenol.,  Avoid sedatives due to above  Chest wall tenderness.  Initial concern for ACS however troponin trend unremarkable, occurred in the setting of fall, reproducible on exam -Supportive care, close monitoring  ESRD on HD Monday Wednesday Friday with hyperkalemia. -HD per nephrology  Hypertension, not at goal.  BP elevated into the 160s.  Hypertensive emergency on admission -Can resume home BP medicines given improvement in mentation -Amlodipine, losartan, hydralazine.  Paroxysmal atrial fibrillation, currently rate controlled in normal sinus rhythm.  Chads vas 6 -On admission IV heparin due to poor mentation -Resume home Eliquis  Ischemic cardiomyopathy.  Preserved EF on most recent TTE.  Euvolemic on exam. -Daily weights, monitor volume status.  Type 1 diabetes, controlled with peripheral neuropathy.  A1c 7% (03/2019) -Wife confirms insulin pump, she took home with her _Consult diabetes coordinator, will start lantus 10 U, Sliding scale as needed -Hold gabapentin  Mood disorder, stable - home Celexa  Hyperlipidemia, stable -Home Lipitor      Family Communication  : called wife [name] via telephone interpreter on 12/14  Code Status : Full code  Disposition Plan  : Continue close monitoring neurologic status, potassium, back pain  Consults  : Nephrology  Procedures  : None  DVT Prophylaxis  : Heparin  Lab Results  Component Value Date   PLT 195 08/28/2019    Diet :  Diet Order            Diet renal with fluid restriction Fluid restriction: 1200 mL Fluid; Room service appropriate? Yes; Fluid consistency: Thin  Diet effective now               Inpatient Medications Scheduled Meds: . amLODipine  10 mg Oral QHS  . apixaban  5 mg Oral BID  . atorvastatin  80 mg Oral Daily  . Chlorhexidine Gluconate Cloth  6 each Topical Q0600  . cinacalcet  30 mg Oral Q M,W,F-HD  . [START ON 08/29/2019]  citalopram  20 mg Oral Q lunch  . [START ON 09/04/2019] darbepoetin (ARANESP) injection - DIALYSIS  40 mcg Intravenous Q Mon-HD  . doxercalciferol  1 mcg Intravenous Q M,W,F-HD  . hydrALAZINE  25 mg Oral Q8H  . insulin aspart  0-6 Units Subcutaneous Q4H  . isosorbide mononitrate  60 mg Oral Daily  . [START ON 08/29/2019] pantoprazole  40 mg Oral QAC lunch   Continuous Infusions: . [START ON 08/30/2019] ferric gluconate (FERRLECIT/NULECIT) IV    . heparin 900 Units/hr (08/28/19 0628)   PRN Meds:.acetaminophen, hydrALAZINE, traMADol  Antibiotics  :   Anti-infectives (From admission, onward)   None       Objective   Vitals:   08/28/19 1300 08/28/19 1330 08/28/19 1344 08/28/19 1441  BP: (!) 171/118 (!) 150/66 (!) 157/71 (!) 166/66  Pulse: 60 70 71 72  Resp:   13 20  Temp:   97.7 F (36.5 C) 98.2 F (36.8 C)  TempSrc:   Oral Oral  SpO2:   98% 98%  Weight:   64.7 kg   Height:        SpO2: 98 % O2 Flow Rate (L/min): 2 L/min  Wt Readings from Last 3 Encounters:  08/28/19 64.7 kg  08/22/19 67.1 kg  07/27/19  67.2 kg     Intake/Output Summary (Last 24 hours) at 08/28/2019 1634 Last data filed at 08/28/2019 1344 Gross per 24 hour  Intake 33.99 ml  Output 2000 ml  Net -1966.01 ml    Physical Exam:  Lying in bed, comfortably Awake Alert, speaking delayed, oriented to person, place, time, able to give history of how he felt prior to admission Moving all extremities against gravity without deficits, following commands [place].AT, No JVD Symmetrical Chest wall movement, Good air movement bilaterally, CTAB RRR,No Gallops,Rubs or new Murmurs, Positive left-sided chest wall tenderness on palpation +ve B.Sounds, Abd Soft, No tenderness, No organomegaly appreciated, No rebound, guarding or rigidity. Paraspinal lumbar tenderness Right foot dressed  I have personally reviewed the following:   Data Reviewed:  CBC Recent Labs  Lab 08/27/19 1320 08/27/19 1328  08/28/19 0340  WBC 9.9  --  9.0  HGB 10.8* 11.9* 10.1*  HCT 33.1* 35.0* 30.9*  PLT 210  --  195  MCV 97.1  --  97.2  MCH 31.7  --  31.8  MCHC 32.6  --  32.7  RDW 15.2  --  15.7*  LYMPHSABS 1.2  --   --   MONOABS 0.9  --   --   EOSABS 0.0  --   --   BASOSABS 0.0  --   --     Chemistries  Recent Labs  Lab 08/27/19 1320 08/27/19 1328 08/28/19 0340  NA 137 136 133*  K 5.1 5.1 6.1*  CL 96* 99 94*  CO2 24  --  22  GLUCOSE 138* 133* 124*  BUN 47* 49* 53*  CREATININE 9.18* 9.50* 10.59*  CALCIUM 9.9  --  9.3  AST 18  --  16  ALT 21  --  17  ALKPHOS 72  --  67  BILITOT 1.1  --  0.9   ------------------------------------------------------------------------------------------------------------------ No results for input(s): CHOL, HDL, LDLCALC, TRIG, CHOLHDL, LDLDIRECT in the last 72 hours.  Lab Results  Component Value Date   HGBA1C 6.8 (H) 08/27/2019   ------------------------------------------------------------------------------------------------------------------ Recent Labs    08/27/19 2241  TSH 2.818   ------------------------------------------------------------------------------------------------------------------ Recent Labs    08/27/19 2241  VITAMINB12 340    Coagulation profile Recent Labs  Lab 08/27/19 1320  INR 1.4*    No results for input(s): DDIMER in the last 72 hours.  Cardiac Enzymes No results for input(s): CKMB, TROPONINI, MYOGLOBIN in the last 168 hours.  Invalid input(s): CK ------------------------------------------------------------------------------------------------------------------    Component Value Date/Time   BNP 1,423.3 (H) 05/04/2018 1515    Micro Results Recent Results (from the past 240 hour(s))  Respiratory Panel by RT PCR (Flu A&B, Covid) - Nasopharyngeal Swab     Status: None   Collection Time: 08/27/19  2:39 PM   Specimen: Nasopharyngeal Swab  Result Value Ref Range Status   SARS Coronavirus 2 by RT PCR NEGATIVE  NEGATIVE Final    Comment: (NOTE) SARS-CoV-2 target nucleic acids are NOT DETECTED. The SARS-CoV-2 RNA is generally detectable in upper respiratoy specimens during the acute phase of infection. The lowest concentration of SARS-CoV-2 viral copies this assay can detect is 131 copies/mL. A negative result does not preclude SARS-Cov-2 infection and should not be used as the sole basis for treatment or other patient management decisions. A negative result may occur with  improper specimen collection/handling, submission of specimen other than nasopharyngeal swab, presence of viral mutation(s) within the areas targeted by this assay, and inadequate number of viral copies (<131 copies/mL). A negative  result must be combined with clinical observations, patient history, and epidemiological information. The expected result is Negative. Fact Sheet for Patients:  PinkCheek.be Fact Sheet for Healthcare Providers:  GravelBags.it This test is not yet ap proved or cleared by the [place] FDA and  has been authorized for detection and/or diagnosis of SARS-CoV-2 by FDA under an Emergency Use Authorization (EUA). This EUA will remain  in effect (meaning this test can be used) for the duration of the COVID-19 declaration under Section 564(b)(1) of the Act, 21 U.S.C. section 360bbb-3(b)(1), unless the authorization is terminated or revoked sooner.    Influenza A by PCR NEGATIVE NEGATIVE Final   Influenza B by PCR NEGATIVE NEGATIVE Final    Comment: (NOTE) The Xpert Xpress SARS-CoV-2/FLU/RSV assay is intended as an aid in  the diagnosis of influenza from Nasopharyngeal swab specimens and  should not be used as a sole basis for treatment. Nasal washings and  aspirates are unacceptable for Xpert Xpress SARS-CoV-2/FLU/RSV  testing. Fact Sheet for Patients: PinkCheek.be Fact Sheet for Healthcare  Providers: GravelBags.it This test is not yet approved or cleared by the [place] FDA and  has been authorized for detection and/or diagnosis of SARS-CoV-2 by  FDA under an Emergency Use Authorization (EUA). This EUA will remain  in effect (meaning this test can be used) for the duration of the  Covid-19 declaration under Section 564(b)(1) of the Act, 21  U.S.C. section 360bbb-3(b)(1), unless the authorization is  terminated or revoked. Performed at [facility], [place] [address], [place], [place] [address]     Radiology Reports CT Code Stroke CTA Head W/WO contrast  Result Date: 08/27/2019 CLINICAL DATA:  63 year old male unresponsive. Scattered small age indeterminate infarcts in the left MCA territory on plain CT today. EXAM: CT ANGIOGRAPHY HEAD AND NECK CT PERFUSION BRAIN TECHNIQUE: Multidetector CT imaging of the head and neck was performed using the standard protocol during bolus administration of intravenous contrast. Multiplanar CT image reconstructions and MIPs were obtained to evaluate the vascular anatomy. Carotid stenosis measurements (when applicable) are obtained utilizing NASCET criteria, using the distal internal carotid diameter as the denominator. Multiphase CT imaging of the brain was performed following IV bolus contrast injection. Subsequent parametric perfusion maps were calculated using RAPID software. CONTRAST:  157mL OMNIPAQUE IOHEXOL 350 MG/ML SOLN COMPARISON:  Plain CT 1328 hours. CTA head and neck 719 04/01/2018 FINDINGS: CT Brain Perfusion Findings: CBF (<30%) Volume: None Perfusion (Tmax>6.0s) volume: None Mismatch Volume: Not applicable Infarction Location:Not applicable CTA NECK Skeleton: No acute osseous abnormality identified. Prior sternotomy. Upper chest: Apical and upper lobe paraseptal and centrilobular emphysema. No superior mediastinal lymphadenopathy. Other neck: No acute findings. Aortic arch: Calcified aortic  atherosclerosis. 3 vessel arch configuration. Right carotid system: No brachiocephalic artery stenosis despite plaque. Normal right CCA origin. Mild plaque proximal to the bifurcation without stenosis. Minimal plaque at the right ICA origin without stenosis. Left carotid system: No left CCA origin stenosis despite plaque. Mild additional plaque proximal to the bifurcation. Calcified plaque at the left ICA origin and bulb with less than 50% stenosis with respect to the distal vessel. Vertebral arteries: No proximal right subclavian artery stenosis despite plaque. 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 Soft and calcified plaque in the proximal left subclavian artery without significant stenosis. Calcified plaque at the left vertebral artery origin with only mild stenosis (series 6, image 122). The left vertebral remains patent to the skull base without stenosis. CTA HEAD Posterior circulation: Diminutive  and poorly enhancing right Vertebral artery V4 segment is stable. The left V4 remains patent and supplies the basilar without stenosis. Patent left PICA origin. Patent basilar artery with stable appearance since 2019. Patent SCA origins. Fetal type bilateral PCA origins again noted. Bilateral PCA branches are stable with mild irregularity. Anterior circulation: Both ICA siphons are patent. Both supraclinoid segments are heavily calcified and appear stable since 2019. On the left there is moderate associated stenosis. On the right there is mild to moderate stenosis. Patent carotid termini. MCA and ACA origins are stable and patent. Diminutive anterior communicating artery. Bilateral ACA branches are within normal limits. Left MCA M1 segment and bifurcation appear stable and patent without stenosis. Right MCA M1 segment and bifurcation appear stable and patent. Visible bilateral MCA branches are stable since 2019. Venous sinuses: Patent. Anatomic variants: Fetal type bilateral  PCA origins. Review of the MIP images confirms the above findings IMPRESSION: 1. Negative for emergent large vessel occlusion. Patent basilar artery and unchanged posterior circulation since 2019. 2. CT Perfusion does not detect ischemic penumbra or core infarct. 3. Positive for chronic occlusion of the right vertebral artery, unchanged. 4. Positive also for chronic calcified ICA siphons with moderate left and mild to moderate right supraclinoid stenosis. 5. Aortic Atherosclerosis (ICD10-I70.0) and Emphysema (ICD10-J43.9). These results were communicated to Dr. Cheral Marker at 2:08 pm on 08/27/2019 by text page via the [facility] messaging system. Electronically Signed   By: Genevie Ann M.D.   On: 08/27/2019 14:08   DG Lumbar Spine 2-3 Views  Result Date: 08/28/2019 CLINICAL DATA:  Back pain, fall EXAM: LUMBAR SPINE - 2-3 VIEW COMPARISON:  None. FINDINGS: There is slight anterior wedge compression deformities of the T11 through L1 vertebral bodies with less than 25% loss in height. These appear to be chronic and were partially visualized on prior exam dating back to 20/10. There is disc height loss and anterior osteophytes most notable at L1 through L3. There is a minimal levoconvex scoliotic curvature. There is diffuse osteopenia. No definite acute fracture is seen. Dense vascular calcifications are noted. IMPRESSION: Chronic slight anterior wedge compression deformities of T11 through L1 with less than 25% loss of height. Mild levoconvex scoliotic curvature. Diffuse osteopenia which limits evaluation, however no definite acute fracture is seen. If pain persists, would recommend cross-sectional imaging for further evaluation. Electronically Signed   By: Prudencio Pair M.D.   On: 08/28/2019 09:20   CT Code Stroke CTA Neck W/WO contrast  Result Date: 08/27/2019 CLINICAL DATA:  63 year old male unresponsive. Scattered small age indeterminate infarcts in the left MCA territory on plain CT today. EXAM: CT ANGIOGRAPHY HEAD AND  NECK CT PERFUSION BRAIN TECHNIQUE: Multidetector CT imaging of the head and neck was performed using the standard protocol during bolus administration of intravenous contrast. Multiplanar CT image reconstructions and MIPs were obtained to evaluate the vascular anatomy. Carotid stenosis measurements (when applicable) are obtained utilizing NASCET criteria, using the distal internal carotid diameter as the denominator. Multiphase CT imaging of the brain was performed following IV bolus contrast injection. Subsequent parametric perfusion maps were calculated using RAPID software. CONTRAST:  149mL OMNIPAQUE IOHEXOL 350 MG/ML SOLN COMPARISON:  Plain CT 1328 hours. CTA head and neck 719 04/01/2018 FINDINGS: CT Brain Perfusion Findings: CBF (<30%) Volume: None Perfusion (Tmax>6.0s) volume: None Mismatch Volume: Not applicable Infarction Location:Not applicable CTA NECK Skeleton: No acute osseous abnormality identified. Prior sternotomy. Upper chest: Apical and upper lobe paraseptal and centrilobular emphysema. No superior mediastinal lymphadenopathy. Other neck: No acute  findings. Aortic arch: Calcified aortic atherosclerosis. 3 vessel arch configuration. Right carotid system: No brachiocephalic artery stenosis despite plaque. Normal right CCA origin. Mild plaque proximal to the bifurcation without stenosis. Minimal plaque at the right ICA origin without stenosis. Left carotid system: No left CCA origin stenosis despite plaque. Mild additional plaque proximal to the bifurcation. Calcified plaque at the left ICA origin and bulb with less than 50% stenosis with respect to the distal vessel. Vertebral arteries: No proximal right subclavian artery stenosis despite plaque. 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 Soft and calcified plaque in the proximal left subclavian artery without significant stenosis. Calcified plaque at the left vertebral artery origin with only mild stenosis  (series 6, image 122). The left vertebral remains patent to the skull base without stenosis. CTA HEAD Posterior circulation: Diminutive and poorly enhancing right Vertebral artery V4 segment is stable. The left V4 remains patent and supplies the basilar without stenosis. Patent left PICA origin. Patent basilar artery with stable appearance since 2019. Patent SCA origins. Fetal type bilateral PCA origins again noted. Bilateral PCA branches are stable with mild irregularity. Anterior circulation: Both ICA siphons are patent. Both supraclinoid segments are heavily calcified and appear stable since 2019. On the left there is moderate associated stenosis. On the right there is mild to moderate stenosis. Patent carotid termini. MCA and ACA origins are stable and patent. Diminutive anterior communicating artery. Bilateral ACA branches are within normal limits. Left MCA M1 segment and bifurcation appear stable and patent without stenosis. Right MCA M1 segment and bifurcation appear stable and patent. Visible bilateral MCA branches are stable since 2019. Venous sinuses: Patent. Anatomic variants: Fetal type bilateral PCA origins. Review of the MIP images confirms the above findings IMPRESSION: 1. Negative for emergent large vessel occlusion. Patent basilar artery and unchanged posterior circulation since 2019. 2. CT Perfusion does not detect ischemic penumbra or core infarct. 3. Positive for chronic occlusion of the right vertebral artery, unchanged. 4. Positive also for chronic calcified ICA siphons with moderate left and mild to moderate right supraclinoid stenosis. 5. Aortic Atherosclerosis (ICD10-I70.0) and Emphysema (ICD10-J43.9). These results were communicated to Dr. Cheral Marker at 2:08 pm on 08/27/2019 by text page via the [facility] messaging system. Electronically Signed   By: Genevie Ann M.D.   On: 08/27/2019 14:08   MR BRAIN WO CONTRAST  Result Date: 08/27/2019 CLINICAL DATA:  Neuro deficit acute.  Rule out stroke.   ESRD. EXAM: MRI HEAD WITHOUT CONTRAST TECHNIQUE: Multiplanar, multiecho pulse sequences of the brain and surrounding structures were obtained without intravenous contrast. COMPARISON:  MRI head 04/01/2018 FINDINGS: Brain: Negative for acute infarct. Mild white matter changes consistent with chronic microvascular ischemia. Negative for hemorrhage or mass. Vascular: Normal arterial flow voids. Skull and upper cervical spine: No focal skeletal lesion. Sinuses/Orbits: Mild mucosal edema paranasal sinuses. Bilateral cataract surgery Other: None IMPRESSION: No acute abnormality. Mild chronic microvascular ischemic change in the white matter is stable from the prior study. Electronically Signed   By: Franchot Gallo M.D.   On: 08/27/2019 16:41   DG Pelvis Portable  Result Date: 08/27/2019 CLINICAL DATA:  Evaluation for metallic devices prior to MRI a EXAM: PORTABLE PELVIS 1-2 VIEWS COMPARISON:  None. FINDINGS: There are metallic devices overlying the right lower quadrant just above the inguinal ligament and overlying the left lower quadrant just above the left iliac crest, both of which are probably extrinsic to the patient. There are safety pins overlying the pelvis as well. IMPRESSION: Metallic  foreign bodies overlying the pelvis as described including safety pins and 2 devices which are likely extrinsic to the patient. Electronically Signed   By: Lorriane Shire M.D.   On: 08/27/2019 14:54   CT Code Stroke Cerebral Perfusion with contrast  Result Date: 08/27/2019 CLINICAL DATA:  63 year old male unresponsive. Scattered small age indeterminate infarcts in the left MCA territory on plain CT today. EXAM: CT ANGIOGRAPHY HEAD AND NECK CT PERFUSION BRAIN TECHNIQUE: Multidetector CT imaging of the head and neck was performed using the standard protocol during bolus administration of intravenous contrast. Multiplanar CT image reconstructions and MIPs were obtained to evaluate the vascular anatomy. Carotid stenosis  measurements (when applicable) are obtained utilizing NASCET criteria, using the distal internal carotid diameter as the denominator. Multiphase CT imaging of the brain was performed following IV bolus contrast injection. Subsequent parametric perfusion maps were calculated using RAPID software. CONTRAST:  186mL OMNIPAQUE IOHEXOL 350 MG/ML SOLN COMPARISON:  Plain CT 1328 hours. CTA head and neck 719 04/01/2018 FINDINGS: CT Brain Perfusion Findings: CBF (<30%) Volume: None Perfusion (Tmax>6.0s) volume: None Mismatch Volume: Not applicable Infarction Location:Not applicable CTA NECK Skeleton: No acute osseous abnormality identified. Prior sternotomy. Upper chest: Apical and upper lobe paraseptal and centrilobular emphysema. No superior mediastinal lymphadenopathy. Other neck: No acute findings. Aortic arch: Calcified aortic atherosclerosis. 3 vessel arch configuration. Right carotid system: No brachiocephalic artery stenosis despite plaque. Normal right CCA origin. Mild plaque proximal to the bifurcation without stenosis. Minimal plaque at the right ICA origin without stenosis. Left carotid system: No left CCA origin stenosis despite plaque. Mild additional plaque proximal to the bifurcation. Calcified plaque at the left ICA origin and bulb with less than 50% stenosis with respect to the distal vessel. Vertebral arteries: No proximal right subclavian artery stenosis despite plaque. 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 Soft and calcified plaque in the proximal left subclavian artery without significant stenosis. Calcified plaque at the left vertebral artery origin with only mild stenosis (series 6, image 122). The left vertebral remains patent to the skull base without stenosis. CTA HEAD Posterior circulation: Diminutive and poorly enhancing right Vertebral artery V4 segment is stable. The left V4 remains patent and supplies the basilar without stenosis. Patent left PICA  origin. Patent basilar artery with stable appearance since 2019. Patent SCA origins. Fetal type bilateral PCA origins again noted. Bilateral PCA branches are stable with mild irregularity. Anterior circulation: Both ICA siphons are patent. Both supraclinoid segments are heavily calcified and appear stable since 2019. On the left there is moderate associated stenosis. On the right there is mild to moderate stenosis. Patent carotid termini. MCA and ACA origins are stable and patent. Diminutive anterior communicating artery. Bilateral ACA branches are within normal limits. Left MCA M1 segment and bifurcation appear stable and patent without stenosis. Right MCA M1 segment and bifurcation appear stable and patent. Visible bilateral MCA branches are stable since 2019. Venous sinuses: Patent. Anatomic variants: Fetal type bilateral PCA origins. Review of the MIP images confirms the above findings IMPRESSION: 1. Negative for emergent large vessel occlusion. Patent basilar artery and unchanged posterior circulation since 2019. 2. CT Perfusion does not detect ischemic penumbra or core infarct. 3. Positive for chronic occlusion of the right vertebral artery, unchanged. 4. Positive also for chronic calcified ICA siphons with moderate left and mild to moderate right supraclinoid stenosis. 5. Aortic Atherosclerosis (ICD10-I70.0) and Emphysema (ICD10-J43.9). These results were communicated to Dr. Cheral Marker at 2:08 pm on 08/27/2019 by text page via  the [employer] system. Electronically Signed   By: Genevie Ann M.D.   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 Both lungs are clear. The visualized skeletal structures are unremarkable. IMPRESSION: Cardiomegaly without acute abnormality of the lungs in AP portable projection Electronically Signed   By: Eddie Candle M.D.   On: 08/27/2019 14:50   DG Abd Portable 1 View  Result Date: 08/27/2019 CLINICAL DATA:  Checking for devices prior to MRI. EXAM: PORTABLE ABDOMEN - 1 VIEW COMPARISON:  Abdominal radiograph dated 06/08/2016 FINDINGS: There is a complex metallic device overlying the right lower quadrant which is likely extrinsic to the patient. There is a circular and a metallic device overlying the left lower quadrant which is also likely extrinsic to the safety pins also overlie the right and left sides of the lower abdomen. Bowel gas pattern is normal. No acute bone abnormality. IMPRESSION: 1. Benign-appearing abdomen. 2. Metallic foreign bodies overlying the right lower quadrant and left lower quadrant. These are both likely extrinsic to the patient. Safety pins are also present. Electronically Signed   By: Lorriane Shire M.D.   On: 08/27/2019 14:51   EEG adult  Result Date: 08/27/2019 Lora Havens, MD     08/27/2019  6:22 PM Patient Name: CEM COLUCCI MRN: [id number] Epilepsy Attending: Lora Havens Referring Physician/Provider: Laurey Morale, NP Date: 08/27/2019 Duration: 30.43mins Patient history: 63yo F with sudden onset of ams. EEG to evaluate for seizure. Level of alertness: awake, asleep AEDs during EEG study: None Technical aspects: This EEG study was done with scalp electrodes positioned according to the 10-20 International system of electrode placement. Electrical activity was acquired at a sampling rate of 500Hz  and reviewed with a high frequency filter of 70Hz  and a low frequency filter of 1Hz . EEG data were recorded continuously and digitally stored. DESCRIPTION: During awake state, the posterior dominant rhythm consists of 8-9 Hz activity of moderate voltage (25-35 uV) seen predominantly in posterior head regions, symmetric and reactive to eye opening and eye closing. Sleep was characterized by sleep spindles (12-14Hz ), maximal frontocentral, and intermittent generalized 2-3Hz  delta  slowing.       Hyperventilation and photic stimulation were not performed. IMPRESSION: This study is within normal limits. No seizures or epileptiform discharges were seen throughout the recording. Lora Havens   CT Angio Chest/Abd/Pel for Dissection W and/or W/WO  Result Date: 08/27/2019 CLINICAL DATA:  Acute chest and back pain with hypertension EXAM: CT ANGIOGRAPHY CHEST, ABDOMEN AND PELVIS TECHNIQUE: Multidetector CT imaging through the chest, abdomen and pelvis was performed using the standard protocol during bolus administration of intravenous contrast. Multiplanar reconstructed images and MIPs were obtained and reviewed to evaluate the vascular anatomy. CONTRAST:  185mL OMNIPAQUE IOHEXOL 350 MG/ML SOLN COMPARISON:  None. FINDINGS: CTA CHEST FINDINGS Cardiovascular: Initial precontrast images demonstrate no hyperdense crescent within the thoracic aorta. The thoracic aorta and its branches demonstrate atherosclerotic calcifications and changes of prior coronary bypass grafting. No evidence of aneurysmal dilatation or dissection is seen. Heavy coronary calcifications are noted. The heart is mildly enlarged in size. The pulmonary artery is well visualized within normal branching pattern. No focal filling defect to suggest pulmonary embolism is seen. Mediastinum/Nodes: Thoracic inlet is within normal limits. No hilar or mediastinal adenopathy is seen. The esophagus as visualized is within normal limits. Lungs/Pleura:  Emphysematous changes are noted in the lungs. Some mild scarring is noted in the right lung apex. No focal infiltrate is seen. Small effusions are noted bilaterally. No sizable parenchymal nodules are seen. Musculoskeletal: The bony structures show degenerative change of the thoracic spine. No acute bony abnormality is noted. Review of the MIP images confirms the above findings. CTA ABDOMEN AND PELVIS FINDINGS VASCULAR Aorta: Abdominal aorta demonstrates atherosclerotic calcifications. No  aneurysmal dilatation or dissection is seen. Celiac: Patent without evidence of aneurysm, dissection, vasculitis or significant stenosis. SMA: Patent without evidence of aneurysm, dissection, vasculitis or significant stenosis. Renals: Both renal arteries are patent without evidence of aneurysm, dissection, vasculitis, fibromuscular dysplasia or significant stenosis. IMA: Patent without evidence of aneurysm, dissection, vasculitis or significant stenosis. Iliacs: Iliac arteries demonstrate diffuse calcification without aneurysmal dilatation or dissection. No focal hemodynamically significant stenosis is noted. Veins: No specific vein abnormality is noted. Review of the MIP images confirms the above findings. NON-VASCULAR Hepatobiliary: Liver is well visualized and within normal limits. Gallbladder demonstrates multiple dependent gallstones without obstructive change. Pancreas: Unremarkable. No pancreatic ductal dilatation or surrounding inflammatory changes. Spleen: Normal in size without focal abnormality. Adrenals/Urinary Tract: Adrenal glands are within normal limits. Kidneys are well visualized bilaterally. No calculi or obstructive changes are noted. 17 mm left upper pole renal cyst is noted. Normal enhancement is seen bilaterally. The bladder is partially distended with opacified and unopacified urine. Stomach/Bowel: The appendix is within normal limits. No obstructive or inflammatory changes of the colon are seen. No small bowel or gastric abnormality is noted. Lymphatic: No significant lymphadenopathy is seen. Reproductive: Prostate is unremarkable. Other: No abdominal wall hernia or abnormality. No abdominopelvic ascites. Musculoskeletal: Degenerative changes of the lumbar spine seen. Review of the MIP images confirms the above findings. IMPRESSION: No evidence of aortic dilatation or dissection. No evidence of pulmonary emboli. Cholelithiasis without complicating factors. Chronic changes in the chest and  abdomen without acute abnormality. Electronically Signed   By: Inez Catalina M.D.   On: 08/27/2019 19:52   CT HEAD CODE STROKE WO CONTRAST  Result Date: 08/27/2019 CLINICAL DATA:  Code stroke. 63 year old male is unresponsive. Weakness. EXAM: CT HEAD WITHOUT CONTRAST TECHNIQUE: Contiguous axial images were obtained from the base of the skull through the vertex without intravenous contrast. COMPARISON:  Head CT 04/01/2018. Brain MRI 04/01/2018. FINDINGS: Brain: Stable cerebral volume since 2019. No midline shift, ventriculomegaly, mass effect, evidence of mass lesion, intracranial hemorrhage or evidence of cortically based acute infarction. Patchy periventricular white matter hypodensity appears stable. Subtle new cortical hypodensity in the anterior and posterior left MCA territory seen on series 3, image 22 and sagittal image 42. There is also a small new round hypodensity in the left posterior lentiform on image 18. But no other CT changes of acute ischemia identified. Vascular: Calcified atherosclerosis at the skull base. No suspicious intracranial vascular hyperdensity. Skull: No acute osseous abnormality identified. Sinuses/Orbits: Visualized paranasal sinuses and mastoids are stable and well pneumatized. Other: No acute orbit or scalp soft tissue findings. There is some calcified scalp vessel atherosclerosis. ASPECTS [facility] Early CT Score) Total score (0-10 with 10 being normal): 7; given the scattered distribution of the 3 small hypodense areas in the left MCA territory. IMPRESSION: 1. 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 No associated hemorrhage or mass effect. 2. Elsewhere stable non contrast CT appearance of the brain since 2019. 3. Given the distribution  of lesions in #1 ASPECTS would be 7. 4. These results were communicated to Dr. Cheral Marker at 1:42 pm on 08/27/2019 by text page  via the [facility] messaging system. Electronically Signed   By: Genevie Ann M.D.   On: 08/27/2019 13:42     Time Spent in minutes  30     Desiree Hane M.D on 08/28/2019 at 4:34 PM  To page go to [email or web address] - password [email or web address]

## 2019-08-28 NOTE — Plan of Care (Signed)
  Problem: Health Behavior/Discharge Planning: Goal: Ability to manage health-related needs will improve Outcome: Not Progressing   

## 2019-08-28 NOTE — Progress Notes (Signed)
Upon arrival to unit, patient was found to be unresponsive.  Patient would not move extremities to verbal commands but would do so spontaneously. During this time patient was unable to move right extremity. Patient would wax and wan with being unresponsive. At times patient would open his eyes when asked. But then would get a blank stare and would have to be brought back around. Patient still does not respond to painful stimuli.   Respirations would dropped to 3 but would rebound to 9 and up. Apneic?? Patient O2 sats 99-100% RA.   Will continue to monitor

## 2019-08-28 NOTE — Progress Notes (Signed)
Patient called out d/t patient's right foot bleeding. Upon assessment, patient was "scratching" the bottom of his foot. A callous about the size of a quarter was partially torn away from the skin. Site bleeding. Asked patient why he was picking at the site, he said "itching". Cleaned site, placed bandage on and wrapped it with gauze. Instructed patient not to mess with it because it would cause an infection. Patient kept trying to mess with it and was instructed to stop. Patient wanted staff to cut it off. Patient was told no and would cause more problems. Will continue to monitor

## 2019-08-28 NOTE — Progress Notes (Signed)
Upon entering the patient's room, patient more alert and responsive. Patient had turned on the TV remote with right hand.  Neuro check showed that patient was unable to equally squeeze his hands with left being weaker or very little grip. Patient able to raise right arm and leg per commands and hold them up. When asked to do the left, patient unable to lift either his arm or leg and hold. Left side would just drop to the bed.   Same result as in the ED when right arm held over head. Patient would let his arm drop behind his head. This was done several times with the same result.   It was suggested that the TV remote be placed on left side (patient stated that he can't lift or use his left arm). TV remote placed on left side and TV turned off. This RN watched to see what the patient would do. In the reflection of the window, this RN would see the patient use his left hand (has the restricted pink band on) to pick up the TV remote and turn the TV back on.   Will continue to monitor

## 2019-08-28 NOTE — Consult Note (Signed)
[facility] Renal Consultation Note    Indication for Consultation:  Management of ESRD/hemodialysis; anemia, hypertension/volume and secondary hyperparathyroidism PCP: [name], MD  HPI: Kenneth [name] is a 63 y.o. male with ESRD presumed 2/2/ DM/ on HD a little over 3 years who dialyzes MWF at [facility] dialysis, with hx of CAD, CABG and subsequent PCI 2018, ISCM, atrial fibrillation on Eliquis who presented yesterday from home after witnessed unresponsive episode around 1230 after complaining of CP earlier in the morning.  Per records, the pateint had CP which prompted him to call EMS before th episode.  He had CP until EMS arrived BP was elevated at 208/95.  He had seen his PCP 12/10 for back pain and Rx a steroid dose pak and baclofen with improvement in back pain. Most recent cath was 03/2019 which showed no interventional targets and he was treated medically with increase of imdur to 90 mg per day. When seen initialy in the ED he was unresponsive to verbal or painful stimuli with initiation of code stroke. CXR was neg , Head CT and MRI were negative for acute CVA.  Neuro recommended EEG. EKG showed no acute change.  He is currently on dialysis. Flailing about at times but able to have a conversation.  C/o pain in chest to touch. Breathing easily.  Due for surgery or access soon.  Bleeding area on right foot from where he says he scratched it with his hands but on further inquiry he it seems like he is trying to shave callous off with a razor periodically which he has been doing periodically.  Past Medical History:  Diagnosis Date  . Acute pulmonary edema ([place]) 05/31/2016  . Acute respiratory failure with hypoxemia ([place]) 05/31/2016  . CAD (coronary artery disease) 10/30/2016   NSTEMI 9/17 with CABG x 3 (LIMA-LAD, SVG-OM, SVG-RCA).   - NSTEMI 10/18 s/p DES to ostial SVG to  OM  . Depression   . Diabetic foot ulcer ([place]) 04/11/2017  . 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 ESRD (end stage renal disease) on dialysis [facility])    "MWF; [place]" (03/30/2018)  . Gastroesophageal reflux disease 08/18/2016  . HCAP (healthcare-associated pneumonia)   . Hemodialysis status ([place])   . Hyperlipidemia   . Hypertension   . Hypertensive heart disease 09/12/2017  . Ischemic rest pain of lower extremity 02/06/2015  . 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 NSTEMI (non-ST elevated myocardial infarction) ([place])   . PAF (paroxysmal atrial fibrillation) ([place])   . Pleural effusion   . Pronation deformity of both feet 02/27/2015   Past Surgical History:  Procedure Laterality Date  . AV FISTULA PLACEMENT Left 06/22/2016   Procedure: ARTERIOVENOUS (AV) FISTULA CREATION LEFT UPPER ARM;  Surgeon: Rosetta Posner, MD;  Location: [place];  Service: Vascular;  Laterality: Left;  . CARDIAC CATHETERIZATION N/A 05/31/2016   Procedure: Left Heart Cath and Coronary Angiography;  Surgeon: Jettie Booze, MD;  Location: [place] CV LAB;  Service: Cardiovascular;  Laterality: N/A;  . CARDIAC CATHETERIZATION N/A 05/31/2016   Procedure: Right Heart Cath;  Surgeon: Jettie Booze, MD;  Location: [place] CV LAB;  Service: Cardiovascular;  Laterality: N/A;  . CARDIAC CATHETERIZATION N/A 05/31/2016   Procedure: IABP Insertion;  Surgeon: Jettie Booze, MD;  Location: [place] CV LAB;  Service: Cardiovascular;  Laterality: N/A;  . CORONARY ARTERY BYPASS GRAFT N/A 06/05/2016   Procedure: CORONARY ARTERY BYPASS  GRAFTING (CABG) x3 LIMA to LAD -SVG to OM -SVG to RCA;  Surgeon: Ivin Poot, MD;  Location: [place];  Service: Open Heart Surgery;  Laterality: N/A;  . CORONARY STENT INTERVENTION N/A 07/07/2017   Procedure: CORONARY STENT INTERVENTION;  Surgeon: Wellington Hampshire, MD;  Location: [place] CV LAB;  Service: Cardiovascular;  Laterality: N/A;  . CORONARY STENT INTERVENTION N/A 03/30/2018   Procedure: CORONARY STENT INTERVENTION;  Surgeon: Martinique, Peter  M, MD;  Location: [place] CV LAB;  Service: Cardiovascular;  Laterality: N/A;  . INSERTION OF DIALYSIS CATHETER N/A 06/05/2016   Procedure: INSERTION OF DIALYSIS/trialysis CATHETER;  Surgeon: Ivin Poot, MD;  Location: [place];  Service: Vascular;  Laterality: N/A;  . INSERTION OF DIALYSIS CATHETER Right 06/13/2016   Procedure: INSERTION OF DIALYSIS CATHETER RIGHT INTERNAL JUGULAR;  Surgeon: Conrad Anon Raices, MD;  Location: [place];  Service: Vascular;  Laterality: Right;  . INTRAOPERATIVE TRANSESOPHAGEAL ECHOCARDIOGRAM N/A 06/05/2016   Procedure: INTRAOPERATIVE TRANSESOPHAGEAL ECHOCARDIOGRAM;  Surgeon: Ivin Poot, MD;  Location: [place];  Service: Open Heart Surgery;  Laterality: N/A;  . LEFT HEART CATH AND CORS/GRAFTS ANGIOGRAPHY N/A 11/03/2016   Procedure: Left Heart Cath and Cors/Grafts Angiography;  Surgeon: Troy Sine, MD;  Location: [place] CV LAB;  Service: Cardiovascular;  Laterality: N/A;  . LEFT HEART CATH AND CORS/GRAFTS ANGIOGRAPHY N/A 07/07/2017   Procedure: LEFT HEART CATH AND CORS/GRAFTS ANGIOGRAPHY;  Surgeon: Larey Dresser, MD;  Location: [place] [facility];  Service: Cardiovascular;  Laterality: N/A;  . LEFT HEART CATH AND CORS/GRAFTS ANGIOGRAPHY N/A 03/30/2018   Procedure: LEFT HEART CATH AND CORS/GRAFTS ANGIOGRAPHY;  Surgeon: Martinique, Peter M, MD;  Location: [place] CV LAB;  Service: Cardiovascular;  Laterality: N/A;  . LEFT HEART CATH AND CORS/GRAFTS ANGIOGRAPHY N/A 04/17/2019   Procedure: LEFT HEART CATH AND CORS/GRAFTS ANGIOGRAPHY;  Surgeon: Larey Dresser, MD;  Location: [place] CV LAB;  Service: Cardiovascular;  Laterality: N/A;  . REVISION OF ARTERIOVENOUS GORETEX GRAFT Left [id number]   Procedure: PLICATION OF ANEURYSM OF ARTERIOVENOUS FISTULA  LEFT ARM;  Surgeon: Rosetta Posner, MD;  Location: [facility];  Service: Vascular;  Laterality: Left;   Family History  Problem Relation Age of Onset  . CAD Father   . Colon cancer Father   . Diabetes Brother     Social History:  reports that he quit smoking about 6 years ago. His smoking use included cigarettes. He has a 52.50 pack-year smoking history. He has never used smokeless tobacco. He reports that he does not drink alcohol or use drugs. No Known Allergies Prior to Admission medications   Medication Sig Start Date End Date Taking? Authorizing Provider  acetaminophen (TYLENOL) 500 MG tablet Take 1,000 mg by mouth every 6 (six) hours as needed for mild pain or headache.    Yes [provider]  amLODipine (NORVASC) 10 MG tablet Take 10 mg by mouth at bedtime.    Yes [provider]  apixaban (ELIQUIS) 5 MG TABS tablet Take 1 tablet (5 mg total) by mouth 2 (two) times daily. 04/18/19  Yes Clegg, Amy D, NP  Ascorbic Acid (VITAMIN C) 1000 MG tablet Take 1,000 mg by mouth 3 (three) times daily.    Yes [provider]  atorvastatin (LIPITOR) 80 MG tablet Take 1 tablet by mouth once daily Patient taking differently: Take 80 mg by mouth daily with lunch.  06/28/19  Yes Larey Dresser, MD  baclofen (LIORESAL) 10 MG tablet Take  10 mg by mouth 2 (two) times daily. 08/24/19  Yes [provider]  calcium acetate (PHOSLO) 667 MG capsule Take 667 mg by mouth 3 (three) times daily with meals.  07/09/17  Yes [provider]  citalopram (CELEXA) 20 MG tablet Take 20 mg by mouth daily with lunch.  02/25/17  Yes [provider]  ezetimibe (ZETIA) 10 MG tablet Take 1 tablet by mouth once daily 08/14/19  Yes Larey Dresser, MD  gabapentin (NEURONTIN) 100 MG capsule Take 100 mg by mouth daily with lunch.  02/11/17  Yes [provider]  hydrALAZINE (APRESOLINE) 25 MG tablet Take 1 tablet (25 mg total) by mouth every 8 (eight) hours. 04/17/19  Yes Clegg, Amy D, NP  Insulin Human (INSULIN PUMP) SOLN Inject into the skin continuous. NOVOLOG   Yes [provider]  isosorbide mononitrate (IMDUR) 60 MG 24 hr tablet Take 1 tablet by mouth once daily Patient  taking differently: Take 60 mg by mouth daily.  05/30/19  Yes Larey Dresser, MD  losartan (COZAAR) 100 MG tablet Take 100 mg by mouth at bedtime.    Yes [provider]  methylPREDNISolone (MEDROL DOSEPAK) 4 MG TBPK tablet Take 8-24 mg by mouth See admin instructions. Taper: 6,5,4,3,2,1 08/24/19  Yes [provider]  NOVOLOG 100 UNIT/ML injection Inject 6-10 Units into the skin See admin instructions. Starts with 10 units per day and adds 6 units with each meal via pump 01/08/19  Yes [provider]  pantoprazole (PROTONIX) 40 MG tablet Take 40 mg by mouth daily before lunch.  01/07/17  Yes [provider]  metoprolol succinate (TOPROL-XL) 25 MG 24 hr tablet TAKE 1 TABLET BY MOUTH AT BEDTIME Patient not taking: No sig reported 07/17/19   Larey Dresser, MD  oxyCODONE (ROXICODONE) 5 MG immediate release tablet Take 1 tablet (5 mg total) by mouth every 6 (six) hours as needed. Patient not taking: Reported on 08/27/2019 07/27/19 07/26/20  Ulyses Amor, PA-C   Current Facility-Administered Medications  Medication Dose Route Frequency Provider Last Rate Last Admin  . acetaminophen (TYLENOL) tablet 650 mg  650 mg Oral Q6H PRN Oretha Milch D, MD      . amLODipine (NORVASC) tablet 10 mg  10 mg Oral QHS Oretha Milch D, MD      . Chlorhexidine Gluconate Cloth 2 % PADS 6 each  6 each Topical Q0600 Alric Seton, PA-C      . cinacalcet (SENSIPAR) tablet 30 mg  30 mg Oral Q M,W,F-HD Alric Seton, PA-C      . [START ON 09/04/2019] Darbepoetin Alfa (ARANESP) injection 40 mcg  40 mcg Intravenous Q Mon-HD Alric Seton, PA-C      . doxercalciferol (HECTOROL) injection 1 mcg  1 mcg Intravenous Q M,W,F-HD Alric Seton, PA-C      . [START ON 08/30/2019] ferric gluconate (NULECIT) 62.5 mg in sodium chloride 0.9 % 100 mL IVPB  62.5 mg Intravenous Q Wed-HD Alric Seton, PA-C      . heparin ADULT infusion 100 units/mL (25000 units/232mL sodium chloride 0.45%)  900  Units/hr Intravenous Continuous Laren Everts, RPH 9 mL/hr at 08/28/19 0628 900 Units/hr at 08/28/19 0628  . hydrALAZINE (APRESOLINE) injection 5-10 mg  5-10 mg Intravenous Q6H PRN Oretha Milch D, MD   5 mg at 08/27/19 2214  . hydrALAZINE (APRESOLINE) tablet 25 mg  25 mg Oral Q8H Nettey, Shayla D, MD      . insulin aspart (novoLOG) injection 0-6 Units  0-6 Units Subcutaneous Q4H Oretha Milch D, MD      . traMADol Veatrice Bourbon) tablet 50 mg  50 mg Oral Q6H PRN Oretha Milch D, MD   50 mg at 08/28/19 [id number]   Labs: Basic Metabolic Panel: Recent Labs  Lab 08/27/19 1320 08/27/19 1328 08/28/19 0340  NA 137 136 133*  K 5.1 5.1 6.1*  CL 96* 99 94*  CO2 24  --  22  GLUCOSE 138* 133* 124*  BUN 47* 49* 53*  CREATININE 9.18* 9.50* 10.59*  CALCIUM 9.9  --  9.3   Liver Function Tests: Recent Labs  Lab 08/27/19 1320 08/28/19 0340  AST 18 16  ALT 21 17  ALKPHOS 72 67  BILITOT 1.1 0.9  PROT 7.7 6.8  ALBUMIN 4.2 3.6   No results for input(s): LIPASE, AMYLASE in the last 168 hours. Recent Labs  Lab 08/27/19 2241  AMMONIA 21   CBC: Recent Labs  Lab 08/27/19 1320 08/27/19 1328 08/28/19 0340  WBC 9.9  --  9.0  NEUTROABS 7.6  --   --   HGB 10.8* 11.9* 10.1*  HCT 33.1* 35.0* 30.9*  MCV 97.1  --  97.2  PLT 210  --  195   Cardiac Enzymes: No results for input(s): CKTOTAL, CKMB, CKMBINDEX, TROPONINI in the last 168 hours. CBG: Recent Labs  Lab 08/27/19 2019 08/27/19 2235 08/28/19 0032 08/28/19 0348 08/28/19 0841  GLUCAP 85 103* 92 117* 118*   Iron Studies: No results for input(s): IRON, TIBC, TRANSFERRIN, FERRITIN in the last 72 hours. Studies/Results: CT Code Stroke CTA Head W/WO contrast  Result Date: 08/27/2019 CLINICAL DATA:  63 year old male unresponsive. Scattered small age indeterminate infarcts in the left MCA territory on plain CT today. EXAM: CT ANGIOGRAPHY HEAD AND NECK CT PERFUSION BRAIN TECHNIQUE: Multidetector CT imaging of the head and neck was performed  using the standard protocol during bolus administration of intravenous contrast. Multiplanar CT image reconstructions and MIPs were obtained to evaluate the vascular anatomy. Carotid stenosis measurements (when applicable) are obtained utilizing NASCET criteria, using the distal internal carotid diameter as the denominator. Multiphase CT imaging of the brain was performed following IV bolus contrast injection. Subsequent parametric perfusion maps were calculated using RAPID software. CONTRAST:  18mL OMNIPAQUE IOHEXOL 350 MG/ML SOLN COMPARISON:  Plain CT 1328 hours. CTA head and neck 719 04/01/2018 FINDINGS: CT Brain Perfusion Findings: CBF (<30%) Volume: None Perfusion (Tmax>6.0s) volume: None Mismatch Volume: Not applicable Infarction Location:Not applicable CTA NECK Skeleton: No acute osseous abnormality identified. Prior sternotomy. Upper chest: Apical and upper lobe paraseptal and centrilobular emphysema. No superior mediastinal lymphadenopathy. Other neck: No acute findings. Aortic arch: Calcified aortic atherosclerosis. 3 vessel arch configuration. Right carotid system: No brachiocephalic artery stenosis despite plaque. Normal right CCA origin. Mild plaque proximal to the bifurcation without stenosis. Minimal plaque at the right ICA origin without stenosis. Left carotid system: No left CCA origin stenosis despite plaque. Mild additional plaque proximal to the bifurcation. Calcified plaque at the left ICA origin and bulb with less than 50% stenosis with respect to the distal vessel. Vertebral arteries: No proximal right subclavian artery stenosis despite plaque. 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 Soft and calcified plaque in the proximal left subclavian artery without significant stenosis. Calcified plaque at the left vertebral artery origin with only mild stenosis (series 6, image 122). The left vertebral remains patent to the skull base without stenosis. CTA  HEAD Posterior circulation: Diminutive and poorly enhancing right Vertebral artery V4 segment  is stable. The left V4 remains patent and supplies the basilar without stenosis. Patent left PICA origin. Patent basilar artery with stable appearance since 2019. Patent SCA origins. Fetal type bilateral PCA origins again noted. Bilateral PCA branches are stable with mild irregularity. Anterior circulation: Both ICA siphons are patent. Both supraclinoid segments are heavily calcified and appear stable since 2019. On the left there is moderate associated stenosis. On the right there is mild to moderate stenosis. Patent carotid termini. MCA and ACA origins are stable and patent. Diminutive anterior communicating artery. Bilateral ACA branches are within normal limits. Left MCA M1 segment and bifurcation appear stable and patent without stenosis. Right MCA M1 segment and bifurcation appear stable and patent. Visible bilateral MCA branches are stable since 2019. Venous sinuses: Patent. Anatomic variants: Fetal type bilateral PCA origins. Review of the MIP images confirms the above findings IMPRESSION: 1. Negative for emergent large vessel occlusion. Patent basilar artery and unchanged posterior circulation since 2019. 2. CT Perfusion does not detect ischemic penumbra or core infarct. 3. Positive for chronic occlusion of the right vertebral artery, unchanged. 4. Positive also for chronic calcified ICA siphons with moderate left and mild to moderate right supraclinoid stenosis. 5. Aortic Atherosclerosis (ICD10-I70.0) and Emphysema (ICD10-J43.9). These results were communicated to Dr. Cheral Marker at 2:08 pm on 08/27/2019 by text page via the [facility] messaging system. Electronically Signed   By: Genevie Ann M.D.   On: 08/27/2019 14:08   DG Lumbar Spine 2-3 Views  Result Date: 08/28/2019 CLINICAL DATA:  Back pain, fall EXAM: LUMBAR SPINE - 2-3 VIEW COMPARISON:  None. FINDINGS: There is slight anterior wedge compression deformities of the  T11 through L1 vertebral bodies with less than 25% loss in height. These appear to be chronic and were partially visualized on prior exam dating back to 20/10. There is disc height loss and anterior osteophytes most notable at L1 through L3. There is a minimal levoconvex scoliotic curvature. There is diffuse osteopenia. No definite acute fracture is seen. Dense vascular calcifications are noted. IMPRESSION: Chronic slight anterior wedge compression deformities of T11 through L1 with less than 25% loss of height. Mild levoconvex scoliotic curvature. Diffuse osteopenia which limits evaluation, however no definite acute fracture is seen. If pain persists, would recommend cross-sectional imaging for further evaluation. Electronically Signed   By: Prudencio Pair M.D.   On: 08/28/2019 09:20   CT Code Stroke CTA Neck W/WO contrast  Result Date: 08/27/2019 CLINICAL DATA:  63 year old male unresponsive. Scattered small age indeterminate infarcts in the left MCA territory on plain CT today. EXAM: CT ANGIOGRAPHY HEAD AND NECK CT PERFUSION BRAIN TECHNIQUE: Multidetector CT imaging of the head and neck was performed using the standard protocol during bolus administration of intravenous contrast. Multiplanar CT image reconstructions and MIPs were obtained to evaluate the vascular anatomy. Carotid stenosis measurements (when applicable) are obtained utilizing NASCET criteria, using the distal internal carotid diameter as the denominator. Multiphase CT imaging of the brain was performed following IV bolus contrast injection. Subsequent parametric perfusion maps were calculated using RAPID software. CONTRAST:  155mL OMNIPAQUE IOHEXOL 350 MG/ML SOLN COMPARISON:  Plain CT 1328 hours. CTA head and neck 719 04/01/2018 FINDINGS: CT Brain Perfusion Findings: CBF (<30%) Volume: None Perfusion (Tmax>6.0s) volume: None Mismatch Volume: Not applicable Infarction Location:Not applicable CTA NECK Skeleton: No acute osseous abnormality  identified. Prior sternotomy. Upper chest: Apical and upper lobe paraseptal and centrilobular emphysema. No superior mediastinal lymphadenopathy. Other neck: No acute findings. Aortic arch: Calcified aortic atherosclerosis. 3 vessel  arch configuration. Right carotid system: No brachiocephalic artery stenosis despite plaque. Normal right CCA origin. Mild plaque proximal to the bifurcation without stenosis. Minimal plaque at the right ICA origin without stenosis. Left carotid system: No left CCA origin stenosis despite plaque. Mild additional plaque proximal to the bifurcation. Calcified plaque at the left ICA origin and bulb with less than 50% stenosis with respect to the distal vessel. Vertebral arteries: No proximal right subclavian artery stenosis despite plaque. 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 Soft and calcified plaque in the proximal left subclavian artery without significant stenosis. Calcified plaque at the left vertebral artery origin with only mild stenosis (series 6, image 122). The left vertebral remains patent to the skull base without stenosis. CTA HEAD Posterior circulation: Diminutive and poorly enhancing right Vertebral artery V4 segment is stable. The left V4 remains patent and supplies the basilar without stenosis. Patent left PICA origin. Patent basilar artery with stable appearance since 2019. Patent SCA origins. Fetal type bilateral PCA origins again noted. Bilateral PCA branches are stable with mild irregularity. Anterior circulation: Both ICA siphons are patent. Both supraclinoid segments are heavily calcified and appear stable since 2019. On the left there is moderate associated stenosis. On the right there is mild to moderate stenosis. Patent carotid termini. MCA and ACA origins are stable and patent. Diminutive anterior communicating artery. Bilateral ACA branches are within normal limits. Left MCA M1 segment and bifurcation appear stable  and patent without stenosis. Right MCA M1 segment and bifurcation appear stable and patent. Visible bilateral MCA branches are stable since 2019. Venous sinuses: Patent. Anatomic variants: Fetal type bilateral PCA origins. Review of the MIP images confirms the above findings IMPRESSION: 1. Negative for emergent large vessel occlusion. Patent basilar artery and unchanged posterior circulation since 2019. 2. CT Perfusion does not detect ischemic penumbra or core infarct. 3. Positive for chronic occlusion of the right vertebral artery, unchanged. 4. Positive also for chronic calcified ICA siphons with moderate left and mild to moderate right supraclinoid stenosis. 5. Aortic Atherosclerosis (ICD10-I70.0) and Emphysema (ICD10-J43.9). These results were communicated to Dr. Cheral Marker at 2:08 pm on 08/27/2019 by text page via the [facility] messaging system. Electronically Signed   By: Genevie Ann M.D.   On: 08/27/2019 14:08   MR BRAIN WO CONTRAST  Result Date: 08/27/2019 CLINICAL DATA:  Neuro deficit acute.  Rule out stroke.  ESRD. EXAM: MRI HEAD WITHOUT CONTRAST TECHNIQUE: Multiplanar, multiecho pulse sequences of the brain and surrounding structures were obtained without intravenous contrast. COMPARISON:  MRI head 04/01/2018 FINDINGS: Brain: Negative for acute infarct. Mild white matter changes consistent with chronic microvascular ischemia. Negative for hemorrhage or mass. Vascular: Normal arterial flow voids. Skull and upper cervical spine: No focal skeletal lesion. Sinuses/Orbits: Mild mucosal edema paranasal sinuses. Bilateral cataract surgery Other: None IMPRESSION: No acute abnormality. Mild chronic microvascular ischemic change in the white matter is stable from the prior study. Electronically Signed   By: Franchot Gallo M.D.   On: 08/27/2019 16:41   DG Pelvis Portable  Result Date: 08/27/2019 CLINICAL DATA:  Evaluation for metallic devices prior to MRI a EXAM: PORTABLE PELVIS 1-2 VIEWS COMPARISON:  None. FINDINGS:  There are metallic devices overlying the right lower quadrant just above the inguinal ligament and overlying the left lower quadrant just above the left iliac crest, both of which are probably extrinsic to the patient. There are safety pins overlying the pelvis as well. IMPRESSION: Metallic foreign bodies overlying the pelvis as described including  safety pins and 2 devices which are likely extrinsic to the patient. Electronically Signed   By: Lorriane Shire M.D.   On: 08/27/2019 14:54   CT Code Stroke Cerebral Perfusion with contrast  Result Date: 08/27/2019 CLINICAL DATA:  63 year old male unresponsive. Scattered small age indeterminate infarcts in the left MCA territory on plain CT today. EXAM: CT ANGIOGRAPHY HEAD AND NECK CT PERFUSION BRAIN TECHNIQUE: Multidetector CT imaging of the head and neck was performed using the standard protocol during bolus administration of intravenous contrast. Multiplanar CT image reconstructions and MIPs were obtained to evaluate the vascular anatomy. Carotid stenosis measurements (when applicable) are obtained utilizing NASCET criteria, using the distal internal carotid diameter as the denominator. Multiphase CT imaging of the brain was performed following IV bolus contrast injection. Subsequent parametric perfusion maps were calculated using RAPID software. CONTRAST:  140mL OMNIPAQUE IOHEXOL 350 MG/ML SOLN COMPARISON:  Plain CT 1328 hours. CTA head and neck 719 04/01/2018 FINDINGS: CT Brain Perfusion Findings: CBF (<30%) Volume: None Perfusion (Tmax>6.0s) volume: None Mismatch Volume: Not applicable Infarction Location:Not applicable CTA NECK Skeleton: No acute osseous abnormality identified. Prior sternotomy. Upper chest: Apical and upper lobe paraseptal and centrilobular emphysema. No superior mediastinal lymphadenopathy. Other neck: No acute findings. Aortic arch: Calcified aortic atherosclerosis. 3 vessel arch configuration. Right carotid system: No brachiocephalic  artery stenosis despite plaque. Normal right CCA origin. Mild plaque proximal to the bifurcation without stenosis. Minimal plaque at the right ICA origin without stenosis. Left carotid system: No left CCA origin stenosis despite plaque. Mild additional plaque proximal to the bifurcation. Calcified plaque at the left ICA origin and bulb with less than 50% stenosis with respect to the distal vessel. Vertebral arteries: No proximal right subclavian artery stenosis despite plaque. 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 Soft and calcified plaque in the proximal left subclavian artery without significant stenosis. Calcified plaque at the left vertebral artery origin with only mild stenosis (series 6, image 122). The left vertebral remains patent to the skull base without stenosis. CTA HEAD Posterior circulation: Diminutive and poorly enhancing right Vertebral artery V4 segment is stable. The left V4 remains patent and supplies the basilar without stenosis. Patent left PICA origin. Patent basilar artery with stable appearance since 2019. Patent SCA origins. Fetal type bilateral PCA origins again noted. Bilateral PCA branches are stable with mild irregularity. Anterior circulation: Both ICA siphons are patent. Both supraclinoid segments are heavily calcified and appear stable since 2019. On the left there is moderate associated stenosis. On the right there is mild to moderate stenosis. Patent carotid termini. MCA and ACA origins are stable and patent. Diminutive anterior communicating artery. Bilateral ACA branches are within normal limits. Left MCA M1 segment and bifurcation appear stable and patent without stenosis. Right MCA M1 segment and bifurcation appear stable and patent. Visible bilateral MCA branches are stable since 2019. Venous sinuses: Patent. Anatomic variants: Fetal type bilateral PCA origins. Review of the MIP images confirms the above findings IMPRESSION: 1.  Negative for emergent large vessel occlusion. Patent basilar artery and unchanged posterior circulation since 2019. 2. CT Perfusion does not detect ischemic penumbra or core infarct. 3. Positive for chronic occlusion of the right vertebral artery, unchanged. 4. Positive also for chronic calcified ICA siphons with moderate left and mild to moderate right supraclinoid stenosis. 5. Aortic Atherosclerosis (ICD10-I70.0) and Emphysema (ICD10-J43.9). These results were communicated to Dr. Cheral Marker at 2:08 pm on 08/27/2019 by text page via the [facility] messaging system. Electronically Signed  By: Genevie Ann M.D.   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 Both lungs are clear. The visualized skeletal structures are unremarkable. IMPRESSION: Cardiomegaly without acute abnormality of the lungs in AP portable projection Electronically Signed   By: Eddie Candle M.D.   On: 08/27/2019 14:50   DG Abd Portable 1 View  Result Date: 08/27/2019 CLINICAL DATA:  Checking for devices prior to MRI. EXAM: PORTABLE ABDOMEN - 1 VIEW COMPARISON:  Abdominal radiograph dated 06/08/2016 FINDINGS: There is a complex metallic device overlying the right lower quadrant which is likely extrinsic to the patient. There is a circular and a metallic device overlying the left lower quadrant which is also likely extrinsic to the safety pins also overlie the right and left sides of the lower abdomen. Bowel gas pattern is normal. No acute bone abnormality. IMPRESSION: 1. Benign-appearing abdomen. 2. Metallic foreign bodies overlying the right lower quadrant and left lower quadrant. These are both likely extrinsic to the patient. Safety pins are also present. Electronically Signed   By: Lorriane Shire M.D.   On: 08/27/2019 14:51   EEG adult  Result Date: 08/27/2019 Lora Havens, MD     08/27/2019  6:22 PM Patient Name: CLAYSON DEUBEL MRN: [id number] Epilepsy Attending: Lora Havens Referring Physician/Provider: Laurey Morale, NP Date: 08/27/2019 Duration: 30.23mins Patient history: 63yo F with sudden onset of ams. EEG to evaluate for seizure. Level of alertness: awake, asleep AEDs during EEG study: None Technical aspects: This EEG study was done with scalp electrodes positioned according to the 10-20 International system of electrode placement. Electrical activity was acquired at a sampling rate of 500Hz  and reviewed with a high frequency filter of 70Hz  and a low frequency filter of 1Hz . EEG data were recorded continuously and digitally stored. DESCRIPTION: During awake state, the posterior dominant rhythm consists of 8-9 Hz activity of moderate voltage (25-35 uV) seen predominantly in posterior head regions, symmetric and reactive to eye opening and eye closing. Sleep was characterized by sleep spindles (12-14Hz ), maximal frontocentral, and intermittent generalized 2-3Hz  delta slowing.       Hyperventilation and photic stimulation were not performed. IMPRESSION: This study is within normal limits. No seizures or epileptiform discharges were seen throughout the recording. Lora Havens   CT Angio Chest/Abd/Pel for Dissection W and/or W/WO  Result Date: 08/27/2019 CLINICAL DATA:  Acute chest and back pain with hypertension EXAM: CT ANGIOGRAPHY CHEST, ABDOMEN AND PELVIS TECHNIQUE: Multidetector CT imaging through the chest, abdomen and pelvis was performed using the standard protocol during bolus administration of intravenous contrast. Multiplanar reconstructed images and MIPs were obtained and reviewed to evaluate the vascular anatomy. CONTRAST:  169mL OMNIPAQUE IOHEXOL 350 MG/ML SOLN COMPARISON:  None. FINDINGS: CTA CHEST FINDINGS Cardiovascular: Initial precontrast images demonstrate no hyperdense crescent within the thoracic aorta. The thoracic aorta and its  branches demonstrate atherosclerotic calcifications and changes of prior coronary bypass grafting. No evidence of aneurysmal dilatation or dissection is seen. Heavy coronary calcifications are noted. The heart is mildly enlarged in size. The pulmonary artery is well visualized within normal branching pattern. No focal filling defect to suggest pulmonary embolism is seen. Mediastinum/Nodes: Thoracic inlet is within normal limits. No hilar or mediastinal adenopathy is seen. The esophagus as visualized is within normal limits. Lungs/Pleura: Emphysematous changes are noted in the lungs. Some  mild scarring is noted in the right lung apex. No focal infiltrate is seen. Small effusions are noted bilaterally. No sizable parenchymal nodules are seen. Musculoskeletal: The bony structures show degenerative change of the thoracic spine. No acute bony abnormality is noted. Review of the MIP images confirms the above findings. CTA ABDOMEN AND PELVIS FINDINGS VASCULAR Aorta: Abdominal aorta demonstrates atherosclerotic calcifications. No aneurysmal dilatation or dissection is seen. Celiac: Patent without evidence of aneurysm, dissection, vasculitis or significant stenosis. SMA: Patent without evidence of aneurysm, dissection, vasculitis or significant stenosis. Renals: Both renal arteries are patent without evidence of aneurysm, dissection, vasculitis, fibromuscular dysplasia or significant stenosis. IMA: Patent without evidence of aneurysm, dissection, vasculitis or significant stenosis. Iliacs: Iliac arteries demonstrate diffuse calcification without aneurysmal dilatation or dissection. No focal hemodynamically significant stenosis is noted. Veins: No specific vein abnormality is noted. Review of the MIP images confirms the above findings. NON-VASCULAR Hepatobiliary: Liver is well visualized and within normal limits. Gallbladder demonstrates multiple dependent gallstones without obstructive change. Pancreas: Unremarkable. No  pancreatic ductal dilatation or surrounding inflammatory changes. Spleen: Normal in size without focal abnormality. Adrenals/Urinary Tract: Adrenal glands are within normal limits. Kidneys are well visualized bilaterally. No calculi or obstructive changes are noted. 17 mm left upper pole renal cyst is noted. Normal enhancement is seen bilaterally. The bladder is partially distended with opacified and unopacified urine. Stomach/Bowel: The appendix is within normal limits. No obstructive or inflammatory changes of the colon are seen. No small bowel or gastric abnormality is noted. Lymphatic: No significant lymphadenopathy is seen. Reproductive: Prostate is unremarkable. Other: No abdominal wall hernia or abnormality. No abdominopelvic ascites. Musculoskeletal: Degenerative changes of the lumbar spine seen. Review of the MIP images confirms the above findings. IMPRESSION: No evidence of aortic dilatation or dissection. No evidence of pulmonary emboli. Cholelithiasis without complicating factors. Chronic changes in the chest and abdomen without acute abnormality. Electronically Signed   By: Inez Catalina M.D.   On: 08/27/2019 19:52   CT HEAD CODE STROKE WO CONTRAST  Result Date: 08/27/2019 CLINICAL DATA:  Code stroke. 63 year old male is unresponsive. Weakness. EXAM: CT HEAD WITHOUT CONTRAST TECHNIQUE: Contiguous axial images were obtained from the base of the skull through the vertex without intravenous contrast. COMPARISON:  Head CT 04/01/2018. Brain MRI 04/01/2018. FINDINGS: Brain: Stable cerebral volume since 2019. No midline shift, ventriculomegaly, mass effect, evidence of mass lesion, intracranial hemorrhage or evidence of cortically based acute infarction. Patchy periventricular white matter hypodensity appears stable. Subtle new cortical hypodensity in the anterior and posterior left MCA territory seen on series 3, image 22 and sagittal image 42. There is also a small new round hypodensity in the left  posterior lentiform on image 18. But no other CT changes of acute ischemia identified. Vascular: Calcified atherosclerosis at the skull base. No suspicious intracranial vascular hyperdensity. Skull: No acute osseous abnormality identified. Sinuses/Orbits: Visualized paranasal sinuses and mastoids are stable and well pneumatized. Other: No acute orbit or scalp soft tissue findings. There is some calcified scalp vessel atherosclerosis. ASPECTS [facility] Stroke Program Early CT Score) Total score (0-10 with 10 being normal): 7; given the scattered distribution of the 3 small hypodense areas in the left MCA territory. IMPRESSION: 1. 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 No associated hemorrhage or mass effect. 2. Elsewhere stable non contrast CT appearance of the brain since 2019. 3. Given the distribution of lesions in #1 ASPECTS would be 7.  4. These results were communicated to Dr. Cheral Marker at 1:42 pm on 08/27/2019 by text page via the [facility] messaging system. Electronically Signed   By: Genevie Ann M.D.   On: 08/27/2019 13:42    ROS: As per HPI - somewhat limited. Speaks simple English. Drowsy.  Physical Exam: Vitals:   08/28/19 0945 08/28/19 1006 08/28/19 1015 08/28/19 1030  BP: (!) 160/49 (!) 168/76 (!) 162/68 (!) 169/74  Pulse: 65 67 64 66  Resp: 19     Temp: 97.8 F (36.6 C)     TempSrc: Oral     SpO2: 98%     Weight: 65.8 kg     Height:         General: WDWN NAD Head: NCAT sclera not icteric MMM Neck: Supple.  Lungs: CTA bilaterally without wheezes, rales, or rhonchi. Breathing is unlabored.chest pain on palpation of upper chest  Heart: RRR with S1 S2.  Abdomen: soft NT + BS Lower extremities:without edema or ischemic changes, bleeding area right lateral plantar surface wound/calloused area Neuro: Alert. Moves all extremities spontaneously. Psych:  Responds to questions, drowsy - dozes  off Dialysis Access: left AVF - scabbed area promimally  Dialysis Orders:  MWF AF 3.75 hr 400/800 EDW 65 2 K 2.25 Ca AVF heparin 2400 hectorol 1 sensipar 30 q HD Mircera 50 q 2 weeks - last 12/7 Recent labs: K 6 29% sat hgb 9.8 iPTh 359 - gets +/- EDW   Assessment/Plan: 1. AMS - noted baclofen on admission med list - and per H and P started 12/10 for back painbut this can cause confusion/AMS and is generally contraindicated in dialysis patients - MS should clear more with dialysis if this is the culprit - CVA w/u negative 2. Hyperkalemia - HD today - may be dietary - prone to same as outpatient - repeat labs in am and check relative clearance in K /Cr 3. Chest pain - seems noncardiac - palpable pain on tactile exam of chest - not clear why it would be sore 4. ESRD -  MWF - HD today on schedule 5. Hypertension/volume  - lower volume with HD - avoid hypotension goal 2.5 L 6. Anemia  - continue outpatient ESA/Fe regimen 7. Metabolic bone disease -  Continue VDRA/Sensipar 8. Nutrition - NPO - will need renal carb mod diet 9. CAD - hx CABG /STEMI/stents 2018 /ISCM  10. Atrial fibrillation on MTP/Eliquis - in NSR 11. DM - per primary 12.  Right plantar foot wound - high risk for infection given diabetes status - defer to PCP- needs close follow up - patient advised he should not do any of his own foot care except to keep clean and [place], PA-C [place] [phone] 08/28/2019, 11:27 AM

## 2019-08-28 NOTE — Progress Notes (Signed)
Patient has insulin pump attached to his abdomen. Pump was not disconnected. Insulin is not transfusing. q 4 CBGs being done per order and coverage if any. Will continue to monitor

## 2019-08-29 DIAGNOSIS — G9341 Metabolic encephalopathy: Secondary | ICD-10-CM | POA: Diagnosis present

## 2019-08-29 DIAGNOSIS — G8929 Other chronic pain: Secondary | ICD-10-CM

## 2019-08-29 LAB — COMPREHENSIVE METABOLIC PANEL
ALT: 17 U/L (ref 0–44)
AST: 20 U/L (ref 15–41)
Albumin: 3.5 g/dL (ref 3.5–5.0)
Alkaline Phosphatase: 65 U/L (ref 38–126)
Anion gap: 14 (ref 5–15)
BUN: 25 mg/dL — ABNORMAL HIGH (ref 8–23)
CO2: 25 mmol/L (ref 22–32)
Calcium: 8.6 mg/dL — ABNORMAL LOW (ref 8.9–10.3)
Chloride: 93 mmol/L — ABNORMAL LOW (ref 98–111)
Creatinine, Ser: 6.94 mg/dL — ABNORMAL HIGH (ref 0.61–1.24)
GFR calc Af Amer: 9 mL/min — ABNORMAL LOW (ref >60)
GFR calc non Af Amer: 8 mL/min — ABNORMAL LOW (ref >60)
Glucose, Bld: 110 mg/dL — ABNORMAL HIGH (ref 70–99)
Potassium: 4.9 mmol/L (ref 3.5–5.1)
Sodium: 132 mmol/L — ABNORMAL LOW (ref 135–145)
Total Bilirubin: 1 mg/dL (ref 0.3–1.2)
Total Protein: 6.6 g/dL (ref 6.5–8.1)

## 2019-08-29 LAB — CBC
HCT: 28.8 % — ABNORMAL LOW (ref 39.0–52.0)
Hemoglobin: 9.7 g/dL — ABNORMAL LOW (ref 13.0–17.0)
MCH: 32.2 pg (ref 26.0–34.0)
MCHC: 33.7 g/dL (ref 30.0–36.0)
MCV: 95.7 fL (ref 80.0–100.0)
Platelets: 143 10*3/uL — ABNORMAL LOW (ref 150–400)
RBC: 3.01 MIL/uL — ABNORMAL LOW (ref 4.22–5.81)
RDW: 15.6 % — ABNORMAL HIGH (ref 11.5–15.5)
WBC: 6.2 10*3/uL (ref 4.0–10.5)
nRBC: 0 % (ref 0.0–0.2)

## 2019-08-29 LAB — GLUCOSE, CAPILLARY
Glucose-Capillary: 122 mg/dL — ABNORMAL HIGH (ref 70–99)
Glucose-Capillary: 149 mg/dL — ABNORMAL HIGH (ref 70–99)
Glucose-Capillary: 94 mg/dL (ref 70–99)

## 2019-08-29 LAB — HEPATITIS B E ANTIGEN: Hep B E Ag: NEGATIVE

## 2019-08-29 NOTE — Discharge Summary (Addendum)
Kenneth [name] [id number] DOB: [birth year] DOA: 08/27/2019  PCP: [name], MD  Admit date: 08/27/2019 Discharge date: 08/29/2019  Admitted From: Home Disposition:  Home  Recommendations for Outpatient Follow-up:  1. Follow up with PCP in 1-2 weeks 2. Please obtain BMP/CBC in one week 3. STOP Taking Baclofen 4. Please follow up on the following pending results:  Home Health:NO Equipment/Devices: none Discharge Condition:Stable CODE STATUS: FULL  Diet recommendation: Carb modified/Renal  Brief/Interim Summary: History of present illness:  Kenneth [name] is a 63 y.o. 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  Patient was unresponsive during evaluation in the ED.  History was provided by wife over phone with (interpreter):  Thepatient woke up this morning complaining of cold sweats. She checked his blood glucose which was 150 and his BP was 185/60. He continued to complain of dizziness, unsteadiness with ambulation and loss of strength. He eventually fell while walking and landed near the bathtub. When she brought him downstairs, he started complaining of chest painand generalized body aches with no clear radiation or alleviating or exacerbating factors. She denied any noted fevers or chills. He continued to have chest pain until EMS arrived and obtained ablood pressure of 208/95.   He was seen by his PCP on 12/10 for back pain and prescribed dose pack of methyprednisolone and baclofenwith noted improvement in back pain per wife.  Hypertension. She  states that he is adherent to his home regimen of blood pressure medications but chart review shows history of poor adherence  CADs/pstatus post CABG with NSTEMI 06/2017, hx of severe instent restenosis 03/2018. History of chronic atypical chest pain episodes, Most recent admission 04/13/2019 withchest pain with mildly elevated troponin (166--153--139). Cath at that time showed no interventional target with 40 to 50% in-stent restenosis. At that time pain was treated medically with increase of Imdur to 90 mg daily   ED COURSE:Upon arrival to ED patient was lethargic and not responsive to verbal or painful stimuli initiating code stroke. Neurology was consulted, patient had inconsistent physical exam unclear if degree of embellishment versus malingering. Patient underwent stat CT head, CT head neck, MRI brain all negative for acute CVA. Neurology recommended EEG and close monitoring the stepdown unit given mental status of unclear etiology.  Blood pressure elevated at 186/75, patient afebrile. Initial high-sensitivity troponin of 74 down trended within 2 hours to 65. Patient was Covid negative. Lab work notable for INR 1.4, unremarkable changes from CBC or CMP otherwise. Chest x-ray with cardiomegaly without acute findings. X-ray of abdomen and pelvis with no acute abnormalities  TRHservice was called for further management and admission Remaining hospital course addressed in problem based format below:   Hospital Course:   Acute metabolic encephalopathy related to baclofen in patient with ESRD, resolved.  Returned to complete normal mental status with discontinuation of baclofen and steroids. Initial concern for acute stroke given elevated BP and admitting physical exam.  Work-up for such included negative CT head/MRI brain and inconsistent neurologic exam by neurology consultants.   -Advised to discontinue baclofen on discharge  Acute on chronic back pain.  Stable Lumbar  x-ray shows signs  of chronic compression fx with nothing acute.  CTA chest abdomen pelvis negative for aortic dissection. Pain controlled with supportive measures with no focal neurologic deficits or alarm symptoms -Warm compresses, Tylenol.,  Avoid sedatives due to above  Chest wall tenderness.  Initial concern for ACS however troponin trend unremarkable, occurred in the setting of fall, reproducible on exam -Supportive care  ESRD on HD Monday Wednesday Friday with hyperkalemia. -HD per nephrology to continue outpatient schedule  Hypertension, improved control. On admission  BP elevated into the 180s in setting of worsening mentation. BP improved with resuming home BP meds prior to discharge.   -Resume home BP medicines given improvement in mentation -Amlodipine, losartan, hydralazine.  Paroxysmal atrial fibrillation, currently rate controlled in normal sinus rhythm.  Chads vas 6 -On admission IV heparin due to poor mentation -Resume home Eliquis  Ischemic cardiomyopathy.   Preserved EF on most recent TTE.  Euvolemic on exam. -Monitor volume status.  Type 1 diabetes, controlled with peripheral neuropathy.   A1c 7% (03/2019). Has insulin pump, can resume on discharge. Briefly used lantus 10 U and BG stayed under good control.  -Resume home gabapentin  Mood disorder, stable - home Celexa  Hyperlipidemia, stable -Home Lipitor   Consultations:  Neurology  Procedures/Studies: none Subjective: He is feeling very well today and ready to go home  Discharge Exam: Vitals:   08/29/19 0623 08/29/19 0843  BP: (!) 154/75 (!) 143/53  Pulse:  63  Resp:  19  Temp:  98.5 F (36.9 C)  SpO2:  100%   Vitals:   08/28/19 2347 08/29/19 0458 08/29/19 0623 08/29/19 0843  BP: (!) 147/69 (!) 126/56 (!) 154/75 (!) 143/53  Pulse: 66 60  63  Resp: 18 11  19   Temp: 98.7 F (37.1 C) 98.6 F (37 C)  98.5 F (36.9 C)  TempSrc: Oral Oral  Oral  SpO2: 95% 98%  100%  Weight:  64.5 kg     Height:       Sitting in chair, comfortably Eating breakfast dMoving all extremities against gravity without deficits, following commands [place].AT, No JVD Symmetrical Chest wall movement, Good air movement bilaterally, CTAB RRR,No Gallops,Rubs or new Murmurs, Positive left-sided chest wall tenderness on palpation +ve B.Sounds, Abd Soft, No tenderness, No organomegaly appreciated, No rebound, guarding or rigidity. Paraspinal lumbar tenderness Right foot dressed  Discharge Diagnoses:  Active Problems:   ESRD (end stage renal disease) ([place])   CAD (coronary artery disease)   Diabetes mellitus with complication (HCC)   Chest pain   Chronic diastolic CHF (congestive heart failure) ([place])   Hypertensive emergency   Hypertensive heart disease   Hyperlipidemia   Status post coronary artery stent placement   Altered mental status   Unresponsive episode   AF (paroxysmal atrial fibrillation) (HCC)   Acute metabolic encephalopathy    Discharge Instructions  Discharge Instructions    Diet - low sodium heart healthy   Complete by: As directed    Increase activity slowly   Complete by: As directed      Allergies as of 08/29/2019   No Known Allergies     Medication List    STOP taking these medications   baclofen 10 MG tablet Commonly known as: LIORESAL   methylPREDNISolone 4 MG Tbpk tablet Commonly known as: MEDROL DOSEPAK   oxyCODONE 5 MG immediate release tablet Commonly known as: Roxicodone     TAKE these medications   acetaminophen 500 MG tablet Commonly known as: TYLENOL Take 1,000 mg by mouth  every 6 (six) hours as needed for mild pain or headache.   amLODipine 10 MG tablet Commonly known as: NORVASC Take 10 mg by mouth at bedtime.   apixaban 5 MG Tabs tablet Commonly known as: Eliquis Take 1 tablet (5 mg total) by mouth 2 (two) times daily.   atorvastatin 80 MG tablet Commonly known as: LIPITOR Take 1 tablet by mouth once daily What changed: when to  take this   calcium acetate 667 MG capsule Commonly known as: PHOSLO Take 667 mg by mouth 3 (three) times daily with meals.   citalopram 20 MG tablet Commonly known as: CELEXA Take 20 mg by mouth daily with lunch.   ezetimibe 10 MG tablet Commonly known as: ZETIA Take 1 tablet by mouth once daily   gabapentin 100 MG capsule Commonly known as: NEURONTIN Take 100 mg by mouth daily with lunch.   hydrALAZINE 25 MG tablet Commonly known as: APRESOLINE Take 1 tablet (25 mg total) by mouth every 8 (eight) hours.   insulin pump Soln Inject into the skin continuous. NOVOLOG   isosorbide mononitrate 60 MG 24 hr tablet Commonly known as: IMDUR Take 1 tablet by mouth once daily   losartan 100 MG tablet Commonly known as: COZAAR Take 100 mg by mouth at bedtime.   metoprolol succinate 25 MG 24 hr tablet Commonly known as: TOPROL-XL TAKE 1 TABLET BY MOUTH AT BEDTIME   NovoLOG 100 UNIT/ML injection Generic drug: insulin aspart Inject 6-10 Units into the skin See admin instructions. Starts with 10 units per day and adds 6 units with each meal via pump   pantoprazole 40 MG tablet Commonly known as: PROTONIX Take 40 mg by mouth daily before lunch.   vitamin C 1000 MG tablet Take 1,000 mg by mouth 3 (three) times daily.       No Known Allergies      The results of significant diagnostics from this hospitalization (including imaging, microbiology, ancillary and laboratory) are listed below for reference.     Microbiology: Recent Results (from the past 240 hour(s))  Respiratory Panel by RT PCR (Flu A&B, Covid) - Nasopharyngeal Swab     Status: None   Collection Time: 08/27/19  2:39 PM   Specimen: Nasopharyngeal Swab  Result Value Ref Range Status   SARS Coronavirus 2 by RT PCR NEGATIVE NEGATIVE Final    Comment: (NOTE) SARS-CoV-2 target nucleic acids are NOT DETECTED. The SARS-CoV-2 RNA is generally detectable in upper respiratoy specimens during the acute phase of  infection. The lowest concentration of SARS-CoV-2 viral copies this assay can detect is 131 copies/mL. A negative result does not preclude SARS-Cov-2 infection and should not be used as the sole basis for treatment or other patient management decisions. A negative result may occur with  improper specimen collection/handling, submission of specimen other than nasopharyngeal swab, presence of viral mutation(s) within the areas targeted by this assay, and inadequate number of viral copies (<131 copies/mL). A negative result must be combined with clinical observations, patient history, and epidemiological information. The expected result is Negative. Fact Sheet for Patients:  PinkCheek.be Fact Sheet for Healthcare Providers:  GravelBags.it This test is not yet ap proved or cleared by the [place] FDA and  has been authorized for detection and/or diagnosis of SARS-CoV-2 by FDA under an Emergency Use Authorization (EUA). This EUA will remain  in effect (meaning this test can be used) for the duration of the COVID-19 declaration under Section 564(b)(1) of the Act, 21 U.S.C. section 360bbb-3(b)(1), unless  the authorization is terminated or revoked sooner.    Influenza A by PCR NEGATIVE NEGATIVE Final   Influenza B by PCR NEGATIVE NEGATIVE Final    Comment: (NOTE) The Xpert Xpress SARS-CoV-2/FLU/RSV assay is intended as an aid in  the diagnosis of influenza from Nasopharyngeal swab specimens and  should not be used as a sole basis for treatment. Nasal washings and  aspirates are unacceptable for Xpert Xpress SARS-CoV-2/FLU/RSV  testing. Fact Sheet for Patients: PinkCheek.be Fact Sheet for Healthcare Providers: GravelBags.it This test is not yet approved or cleared by the [place] FDA and  has been authorized for detection and/or diagnosis of SARS-CoV-2 by  FDA under  an Emergency Use Authorization (EUA). This EUA will remain  in effect (meaning this test can be used) for the duration of the  Covid-19 declaration under Section 564(b)(1) of the Act, 21  U.S.C. section 360bbb-3(b)(1), unless the authorization is  terminated or revoked. Performed at [facility], [place] [address] [place], [place] [address]      Labs: BNP (last 3 results) No results for input(s): BNP in the last 8760 hours. Basic Metabolic Panel: Recent Labs  Lab 08/27/19 1320 08/27/19 1328 08/28/19 0340 08/28/19 1615 08/29/19 0238  NA 137 136 133*  --  132*  K 5.1 5.1 6.1* 4.1 4.9  CL 96* 99 94*  --  93*  CO2 24  --  22  --  25  GLUCOSE 138* 133* 124*  --  110*  BUN 47* 49* 53*  --  25*  CREATININE 9.18* 9.50* 10.59*  --  6.94*  CALCIUM 9.9  --  9.3  --  8.6*   Liver Function Tests: Recent Labs  Lab 08/27/19 1320 08/28/19 0340 08/29/19 0238  AST 18 16 20   ALT 21 17 17   ALKPHOS 72 67 65  BILITOT 1.1 0.9 1.0  PROT 7.7 6.8 6.6  ALBUMIN 4.2 3.6 3.5   No results for input(s): LIPASE, AMYLASE in the last 168 hours. Recent Labs  Lab 08/27/19 2241  AMMONIA 21   CBC: Recent Labs  Lab 08/27/19 1320 08/27/19 1328 08/28/19 0340 08/29/19 0238  WBC 9.9  --  9.0 6.2  NEUTROABS 7.6  --   --   --   HGB 10.8* 11.9* 10.1* 9.7*  HCT 33.1* 35.0* 30.9* 28.8*  MCV 97.1  --  97.2 95.7  PLT 210  --  195 143*   Cardiac Enzymes: No results for input(s): CKTOTAL, CKMB, CKMBINDEX, TROPONINI in the last 168 hours. BNP: Invalid input(s): POCBNP CBG: Recent Labs  Lab 08/28/19 0841 08/28/19 1639 08/28/19 2051 08/28/19 2349 08/29/19 0552  GLUCAP 118* 299* 184* 122* 94   D-Dimer No results for input(s): DDIMER in the last 72 hours. Hgb A1c Recent Labs    08/27/19 1917  HGBA1C 6.8*   Lipid Profile No results for input(s): CHOL, HDL, LDLCALC, TRIG, CHOLHDL, LDLDIRECT in the last 72 hours. Thyroid function studies Recent Labs    08/27/19 2241  TSH 2.818    Anemia work up Recent Labs    08/27/19 2241  VITAMINB12 340   Urinalysis    Component Value Date/Time   COLORURINE YELLOW 10/30/2016 [place] 10/30/2016 2339   LABSPEC 1.010 10/30/2016 2339   PHURINE 5.0 10/30/2016 [place] 10/30/2016 2339   HGBUR NEGATIVE 10/30/2016 [place] 10/30/2016 [place] 10/30/2016 2339   PROTEINUR 100 (A) 10/30/2016 2339   NITRITE NEGATIVE 10/30/2016  [place] 10/30/2016 2339   Sepsis Labs Invalid input(s): PROCALCITONIN,  WBC,  LACTICIDVEN Microbiology Recent Results (from the past 240 hour(s))  Respiratory Panel by RT PCR (Flu A&B, Covid) - Nasopharyngeal Swab     Status: None   Collection Time: 08/27/19  2:39 PM   Specimen: Nasopharyngeal Swab  Result Value Ref Range Status   SARS Coronavirus 2 by RT PCR NEGATIVE NEGATIVE Final    Comment: (NOTE) SARS-CoV-2 target nucleic acids are NOT DETECTED. The SARS-CoV-2 RNA is generally detectable in upper respiratoy specimens during the acute phase of infection. The lowest concentration of SARS-CoV-2 viral copies this assay can detect is 131 copies/mL. A negative result does not preclude SARS-Cov-2 infection and should not be used as the sole basis for treatment or other patient management decisions. A negative result may occur with  improper specimen collection/handling, submission of specimen other than nasopharyngeal swab, presence of viral mutation(s) within the areas targeted by this assay, and inadequate number of viral copies (<131 copies/mL). A negative result must be combined with clinical observations, patient history, and epidemiological information. The expected result is Negative. Fact Sheet for Patients:  PinkCheek.be Fact Sheet for Healthcare Providers:  GravelBags.it This test is not yet ap proved or cleared by the [place] FDA and   has been authorized for detection and/or diagnosis of SARS-CoV-2 by FDA under an Emergency Use Authorization (EUA). This EUA will remain  in effect (meaning this test can be used) for the duration of the COVID-19 declaration under Section 564(b)(1) of the Act, 21 U.S.C. section 360bbb-3(b)(1), unless the authorization is terminated or revoked sooner.    Influenza A by PCR NEGATIVE NEGATIVE Final   Influenza B by PCR NEGATIVE NEGATIVE Final    Comment: (NOTE) The Xpert Xpress SARS-CoV-2/FLU/RSV assay is intended as an aid in  the diagnosis of influenza from Nasopharyngeal swab specimens and  should not be used as a sole basis for treatment. Nasal washings and  aspirates are unacceptable for Xpert Xpress SARS-CoV-2/FLU/RSV  testing. Fact Sheet for Patients: PinkCheek.be Fact Sheet for Healthcare Providers: GravelBags.it This test is not yet approved or cleared by the [place] FDA and  has been authorized for detection and/or diagnosis of SARS-CoV-2 by  FDA under an Emergency Use Authorization (EUA). This EUA will remain  in effect (meaning this test can be used) for the duration of the  Covid-19 declaration under Section 564(b)(1) of the Act, 21  U.S.C. section 360bbb-3(b)(1), unless the authorization is  terminated or revoked. Performed at [facility], [place] [address], [place], [place] [address]      Time coordinating discharge: Over 30 minutes  SIGNED:   Desiree Hane, MD  [facility] 08/29/2019, 9:53 AM Pager   If 7PM-7AM, please contact night-coverage [email or web address] Password [email or web address]

## 2019-08-29 NOTE — Progress Notes (Signed)
[facility] Progress Note   Dialysis Orders:  MWF AF 3.75 hr 400/800 EDW 65 2 K 2.25 Ca AVF heparin 2400 hectorol 1 sensipar 30 q HD Mircera 50 q 2 weeks - last 12/7 Recent labs: K 6 29% sat hgb 9.8 iPTh 359 - gets +/- EDW   Assessment/Plan: 1. AMS - noted baclofen on admission med list - and per H and P started 12/10 for back painbut this can cause confusion/AMS and is generally contraindicated in dialysis patients  MRI head neg for CVA-MS back to baseline today 2. Hyperkalemia - resolved with HD 3. ESRD -  MWF - next HD Wed at home HD unit; AVF revision scheduled 12/24- needs to be cannulated away from this area 4. Hypertension/volume  - lower volume with HD - net UF 2 L Monday -post HD bed wt 64.7 5. Anemia  - continue outpatient ESA/Fe regimen 6. Metabolic bone disease -  Continue VDRA/Sensipar 7. CAD - hx CABG /STEMI/stents 2018 /ISCM  8. Atrial fibrillation on MTP/Eliquis - in NSR 9  DM - per primary 10.  Right plantar foot wound - high risk for infection given diabetes status - defer to PCP- needs close follow up - patient advised he should not do any of his own foot care except to keep clean and Fox, PA-C [place] [phone] 08/29/2019,10:45 AM  LOS: 2 days   Subjective:   Plan to go home today.  Understands to not take baclofen again.    Objective Vitals:   08/28/19 2347 08/29/19 0458 08/29/19 0623 08/29/19 0843  BP: (!) 147/69 (!) 126/56 (!) 154/75 (!) 143/53  Pulse: 66 60  63  Resp: 18 11  19   Temp: 98.7 F (37.1 C) 98.6 F (37 C)  98.5 F (36.9 C)  TempSrc: Oral Oral  Oral  SpO2: 95% 98%  100%  Weight:  64.5 kg    Height:       Physical Exam General: NAD well oriented Heart: RRR Lungs: no rales Abdomen: soft NT Extremities: no LE edema Dialysis Access:  Left upper AVF + bruit - proximal scabbed area for surgical revision in the future -   Additional Objective Labs: Basic Metabolic  Panel: Recent Labs  Lab 08/27/19 1320 08/27/19 1328 08/28/19 0340 08/28/19 1615 08/29/19 0238  NA 137 136 133*  --  132*  K 5.1 5.1 6.1* 4.1 4.9  CL 96* 99 94*  --  93*  CO2 24  --  22  --  25  GLUCOSE 138* 133* 124*  --  110*  BUN 47* 49* 53*  --  25*  CREATININE 9.18* 9.50* 10.59*  --  6.94*  CALCIUM 9.9  --  9.3  --  8.6*   Liver Function Tests: Recent Labs  Lab 08/27/19 1320 08/28/19 0340 08/29/19 0238  AST 18 16 20   ALT 21 17 17   ALKPHOS 72 67 65  BILITOT 1.1 0.9 1.0  PROT 7.7 6.8 6.6  ALBUMIN 4.2 3.6 3.5   No results for input(s): LIPASE, AMYLASE in the last 168 hours. CBC: Recent Labs  Lab 08/27/19 1320 08/27/19 1328 08/28/19 0340 08/29/19 0238  WBC 9.9  --  9.0 6.2  NEUTROABS 7.6  --   --   --   HGB 10.8* 11.9* 10.1* 9.7*  HCT 33.1* 35.0* 30.9* 28.8*  MCV 97.1  --  97.2 95.7  PLT 210  --  195 143*   Blood Culture    Component  Value Date/Time   SDES  10/13/2017 1552    TOE LEFT FOURTH Performed at [facility], [place] [address] [place], [place] [address]    [facility]  10/13/2017 1552    NONE Performed at [facility], [address] [place], [place] [address]    CULT  10/13/2017 1552    ABUNDANT METHICILLIN RESISTANT STAPHYLOCOCCUS AUREUS   REPTSTATUS 10/16/2017 FINAL 10/13/2017 1552    Cardiac Enzymes: No results for input(s): CKTOTAL, CKMB, CKMBINDEX, TROPONINI in the last 168 hours. CBG: Recent Labs  Lab 08/28/19 0841 08/28/19 1639 08/28/19 2051 08/28/19 2349 08/29/19 0552  GLUCAP 118* 299* 184* 122* 94   Iron Studies: No results for input(s): IRON, TIBC, TRANSFERRIN, FERRITIN in the last 72 hours. Lab Results  Component Value Date   INR 1.4 (H) 08/27/2019   INR 1.0 07/27/2019   INR 0.95 07/07/2017   Studies/Results: CT Code Stroke CTA Head W/WO contrast  Result Date: 08/27/2019 CLINICAL DATA:  63 year old male unresponsive. Scattered small age indeterminate infarcts in the left MCA territory on plain CT  today. EXAM: CT ANGIOGRAPHY HEAD AND NECK CT PERFUSION BRAIN TECHNIQUE: Multidetector CT imaging of the head and neck was performed using the standard protocol during bolus administration of intravenous contrast. Multiplanar CT image reconstructions and MIPs were obtained to evaluate the vascular anatomy. Carotid stenosis measurements (when applicable) are obtained utilizing NASCET criteria, using the distal internal carotid diameter as the denominator. Multiphase CT imaging of the brain was performed following IV bolus contrast injection. Subsequent parametric perfusion maps were calculated using RAPID software. CONTRAST:  133mL OMNIPAQUE IOHEXOL 350 MG/ML SOLN COMPARISON:  Plain CT 1328 hours. CTA head and neck 719 04/01/2018 FINDINGS: CT Brain Perfusion Findings: CBF (<30%) Volume: None Perfusion (Tmax>6.0s) volume: None Mismatch Volume: Not applicable Infarction Location:Not applicable CTA NECK Skeleton: No acute osseous abnormality identified. Prior sternotomy. Upper chest: Apical and upper lobe paraseptal and centrilobular emphysema. No superior mediastinal lymphadenopathy. Other neck: No acute findings. Aortic arch: Calcified aortic atherosclerosis. 3 vessel arch configuration. Right carotid system: No brachiocephalic artery stenosis despite plaque. Normal right CCA origin. Mild plaque proximal to the bifurcation without stenosis. Minimal plaque at the right ICA origin without stenosis. Left carotid system: No left CCA origin stenosis despite plaque. Mild additional plaque proximal to the bifurcation. Calcified plaque at the left ICA origin and bulb with less than 50% stenosis with respect to the distal vessel. Vertebral arteries: No proximal right subclavian artery stenosis despite plaque. 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 Soft and calcified plaque in the proximal left subclavian artery without significant stenosis. Calcified plaque at the left vertebral  artery origin with only mild stenosis (series 6, image 122). The left vertebral remains patent to the skull base without stenosis. CTA HEAD Posterior circulation: Diminutive and poorly enhancing right Vertebral artery V4 segment is stable. The left V4 remains patent and supplies the basilar without stenosis. Patent left PICA origin. Patent basilar artery with stable appearance since 2019. Patent SCA origins. Fetal type bilateral PCA origins again noted. Bilateral PCA branches are stable with mild irregularity. Anterior circulation: Both ICA siphons are patent. Both supraclinoid segments are heavily calcified and appear stable since 2019. On the left there is moderate associated stenosis. On the right there is mild to moderate stenosis. Patent carotid termini. MCA and ACA origins are stable and patent. Diminutive anterior communicating artery. Bilateral ACA branches are within normal limits. Left MCA M1 segment and bifurcation appear stable and patent without  stenosis. Right MCA M1 segment and bifurcation appear stable and patent. Visible bilateral MCA branches are stable since 2019. Venous sinuses: Patent. Anatomic variants: Fetal type bilateral PCA origins. Review of the MIP images confirms the above findings IMPRESSION: 1. Negative for emergent large vessel occlusion. Patent basilar artery and unchanged posterior circulation since 2019. 2. CT Perfusion does not detect ischemic penumbra or core infarct. 3. Positive for chronic occlusion of the right vertebral artery, unchanged. 4. Positive also for chronic calcified ICA siphons with moderate left and mild to moderate right supraclinoid stenosis. 5. Aortic Atherosclerosis (ICD10-I70.0) and Emphysema (ICD10-J43.9). These results were communicated to Dr. Cheral Marker at 2:08 pm on 08/27/2019 by text page via the [facility] messaging system. Electronically Signed   By: Genevie Ann M.D.   On: 08/27/2019 14:08   DG Lumbar Spine 2-3 Views  Result Date: 08/28/2019 CLINICAL DATA:   Back pain, fall EXAM: LUMBAR SPINE - 2-3 VIEW COMPARISON:  None. FINDINGS: There is slight anterior wedge compression deformities of the T11 through L1 vertebral bodies with less than 25% loss in height. These appear to be chronic and were partially visualized on prior exam dating back to 20/10. There is disc height loss and anterior osteophytes most notable at L1 through L3. There is a minimal levoconvex scoliotic curvature. There is diffuse osteopenia. No definite acute fracture is seen. Dense vascular calcifications are noted. IMPRESSION: Chronic slight anterior wedge compression deformities of T11 through L1 with less than 25% loss of height. Mild levoconvex scoliotic curvature. Diffuse osteopenia which limits evaluation, however no definite acute fracture is seen. If pain persists, would recommend cross-sectional imaging for further evaluation. Electronically Signed   By: Prudencio Pair M.D.   On: 08/28/2019 09:20   CT Code Stroke CTA Neck W/WO contrast  Result Date: 08/27/2019 CLINICAL DATA:  63 year old male unresponsive. Scattered small age indeterminate infarcts in the left MCA territory on plain CT today. EXAM: CT ANGIOGRAPHY HEAD AND NECK CT PERFUSION BRAIN TECHNIQUE: Multidetector CT imaging of the head and neck was performed using the standard protocol during bolus administration of intravenous contrast. Multiplanar CT image reconstructions and MIPs were obtained to evaluate the vascular anatomy. Carotid stenosis measurements (when applicable) are obtained utilizing NASCET criteria, using the distal internal carotid diameter as the denominator. Multiphase CT imaging of the brain was performed following IV bolus contrast injection. Subsequent parametric perfusion maps were calculated using RAPID software. CONTRAST:  119mL OMNIPAQUE IOHEXOL 350 MG/ML SOLN COMPARISON:  Plain CT 1328 hours. CTA head and neck 719 04/01/2018 FINDINGS: CT Brain Perfusion Findings: CBF (<30%) Volume: None Perfusion  (Tmax>6.0s) volume: None Mismatch Volume: Not applicable Infarction Location:Not applicable CTA NECK Skeleton: No acute osseous abnormality identified. Prior sternotomy. Upper chest: Apical and upper lobe paraseptal and centrilobular emphysema. No superior mediastinal lymphadenopathy. Other neck: No acute findings. Aortic arch: Calcified aortic atherosclerosis. 3 vessel arch configuration. Right carotid system: No brachiocephalic artery stenosis despite plaque. Normal right CCA origin. Mild plaque proximal to the bifurcation without stenosis. Minimal plaque at the right ICA origin without stenosis. Left carotid system: No left CCA origin stenosis despite plaque. Mild additional plaque proximal to the bifurcation. Calcified plaque at the left ICA origin and bulb with less than 50% stenosis with respect to the distal vessel. Vertebral arteries: No proximal right subclavian artery stenosis despite plaque. 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 Soft and calcified plaque in the proximal left subclavian artery without significant stenosis. Calcified plaque at the left vertebral artery origin with  only mild stenosis (series 6, image 122). The left vertebral remains patent to the skull base without stenosis. CTA HEAD Posterior circulation: Diminutive and poorly enhancing right Vertebral artery V4 segment is stable. The left V4 remains patent and supplies the basilar without stenosis. Patent left PICA origin. Patent basilar artery with stable appearance since 2019. Patent SCA origins. Fetal type bilateral PCA origins again noted. Bilateral PCA branches are stable with mild irregularity. Anterior circulation: Both ICA siphons are patent. Both supraclinoid segments are heavily calcified and appear stable since 2019. On the left there is moderate associated stenosis. On the right there is mild to moderate stenosis. Patent carotid termini. MCA and ACA origins are stable and patent.  Diminutive anterior communicating artery. Bilateral ACA branches are within normal limits. Left MCA M1 segment and bifurcation appear stable and patent without stenosis. Right MCA M1 segment and bifurcation appear stable and patent. Visible bilateral MCA branches are stable since 2019. Venous sinuses: Patent. Anatomic variants: Fetal type bilateral PCA origins. Review of the MIP images confirms the above findings IMPRESSION: 1. Negative for emergent large vessel occlusion. Patent basilar artery and unchanged posterior circulation since 2019. 2. CT Perfusion does not detect ischemic penumbra or core infarct. 3. Positive for chronic occlusion of the right vertebral artery, unchanged. 4. Positive also for chronic calcified ICA siphons with moderate left and mild to moderate right supraclinoid stenosis. 5. Aortic Atherosclerosis (ICD10-I70.0) and Emphysema (ICD10-J43.9). These results were communicated to Dr. Cheral Marker at 2:08 pm on 08/27/2019 by text page via the [facility] messaging system. Electronically Signed   By: Genevie Ann M.D.   On: 08/27/2019 14:08   MR BRAIN WO CONTRAST  Result Date: 08/27/2019 CLINICAL DATA:  Neuro deficit acute.  Rule out stroke.  ESRD. EXAM: MRI HEAD WITHOUT CONTRAST TECHNIQUE: Multiplanar, multiecho pulse sequences of the brain and surrounding structures were obtained without intravenous contrast. COMPARISON:  MRI head 04/01/2018 FINDINGS: Brain: Negative for acute infarct. Mild white matter changes consistent with chronic microvascular ischemia. Negative for hemorrhage or mass. Vascular: Normal arterial flow voids. Skull and upper cervical spine: No focal skeletal lesion. Sinuses/Orbits: Mild mucosal edema paranasal sinuses. Bilateral cataract surgery Other: None IMPRESSION: No acute abnormality. Mild chronic microvascular ischemic change in the white matter is stable from the prior study. Electronically Signed   By: Franchot Gallo M.D.   On: 08/27/2019 16:41   DG Pelvis Portable  Result  Date: 08/27/2019 CLINICAL DATA:  Evaluation for metallic devices prior to MRI a EXAM: PORTABLE PELVIS 1-2 VIEWS COMPARISON:  None. FINDINGS: There are metallic devices overlying the right lower quadrant just above the inguinal ligament and overlying the left lower quadrant just above the left iliac crest, both of which are probably extrinsic to the patient. There are safety pins overlying the pelvis as well. IMPRESSION: Metallic foreign bodies overlying the pelvis as described including safety pins and 2 devices which are likely extrinsic to the patient. Electronically Signed   By: Lorriane Shire M.D.   On: 08/27/2019 14:54   CT Code Stroke Cerebral Perfusion with contrast  Result Date: 08/27/2019 CLINICAL DATA:  63 year old male unresponsive. Scattered small age indeterminate infarcts in the left MCA territory on plain CT today. EXAM: CT ANGIOGRAPHY HEAD AND NECK CT PERFUSION BRAIN TECHNIQUE: Multidetector CT imaging of the head and neck was performed using the standard protocol during bolus administration of intravenous contrast. Multiplanar CT image reconstructions and MIPs were obtained to evaluate the vascular anatomy. Carotid stenosis measurements (when applicable) are obtained utilizing NASCET  criteria, using the distal internal carotid diameter as the denominator. Multiphase CT imaging of the brain was performed following IV bolus contrast injection. Subsequent parametric perfusion maps were calculated using RAPID software. CONTRAST:  19mL OMNIPAQUE IOHEXOL 350 MG/ML SOLN COMPARISON:  Plain CT 1328 hours. CTA head and neck 719 04/01/2018 FINDINGS: CT Brain Perfusion Findings: CBF (<30%) Volume: None Perfusion (Tmax>6.0s) volume: None Mismatch Volume: Not applicable Infarction Location:Not applicable CTA NECK Skeleton: No acute osseous abnormality identified. Prior sternotomy. Upper chest: Apical and upper lobe paraseptal and centrilobular emphysema. No superior mediastinal lymphadenopathy. Other neck:  No acute findings. Aortic arch: Calcified aortic atherosclerosis. 3 vessel arch configuration. Right carotid system: No brachiocephalic artery stenosis despite plaque. Normal right CCA origin. Mild plaque proximal to the bifurcation without stenosis. Minimal plaque at the right ICA origin without stenosis. Left carotid system: No left CCA origin stenosis despite plaque. Mild additional plaque proximal to the bifurcation. Calcified plaque at the left ICA origin and bulb with less than 50% stenosis with respect to the distal vessel. Vertebral arteries: No proximal right subclavian artery stenosis despite plaque. 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 Soft and calcified plaque in the proximal left subclavian artery without significant stenosis. Calcified plaque at the left vertebral artery origin with only mild stenosis (series 6, image 122). The left vertebral remains patent to the skull base without stenosis. CTA HEAD Posterior circulation: Diminutive and poorly enhancing right Vertebral artery V4 segment is stable. The left V4 remains patent and supplies the basilar without stenosis. Patent left PICA origin. Patent basilar artery with stable appearance since 2019. Patent SCA origins. Fetal type bilateral PCA origins again noted. Bilateral PCA branches are stable with mild irregularity. Anterior circulation: Both ICA siphons are patent. Both supraclinoid segments are heavily calcified and appear stable since 2019. On the left there is moderate associated stenosis. On the right there is mild to moderate stenosis. Patent carotid termini. MCA and ACA origins are stable and patent. Diminutive anterior communicating artery. Bilateral ACA branches are within normal limits. Left MCA M1 segment and bifurcation appear stable and patent without stenosis. Right MCA M1 segment and bifurcation appear stable and patent. Visible bilateral MCA branches are stable since 2019. Venous  sinuses: Patent. Anatomic variants: Fetal type bilateral PCA origins. Review of the MIP images confirms the above findings IMPRESSION: 1. Negative for emergent large vessel occlusion. Patent basilar artery and unchanged posterior circulation since 2019. 2. CT Perfusion does not detect ischemic penumbra or core infarct. 3. Positive for chronic occlusion of the right vertebral artery, unchanged. 4. Positive also for chronic calcified ICA siphons with moderate left and mild to moderate right supraclinoid stenosis. 5. Aortic Atherosclerosis (ICD10-I70.0) and Emphysema (ICD10-J43.9). These results were communicated to Dr. Cheral Marker at 2:08 pm on 08/27/2019 by text page via the [facility] messaging system. Electronically Signed   By: Genevie Ann M.D.   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 Both lungs are clear. The visualized skeletal structures are unremarkable. IMPRESSION: Cardiomegaly without acute abnormality of the lungs in AP portable projection Electronically Signed   By: Eddie Candle M.D.   On: 08/27/2019 14:50   DG Abd Portable 1 View  Result Date: 08/27/2019 CLINICAL DATA:  Checking for devices prior to MRI. EXAM: PORTABLE ABDOMEN - 1 VIEW COMPARISON:  Abdominal radiograph dated 06/08/2016  FINDINGS: There is a complex metallic device overlying the right lower quadrant which is likely extrinsic to the patient. There is a circular and a metallic device overlying the left lower quadrant which is also likely extrinsic to the safety pins also overlie the right and left sides of the lower abdomen. Bowel gas pattern is normal. No acute bone abnormality. IMPRESSION: 1. Benign-appearing abdomen. 2. Metallic foreign bodies overlying the right lower quadrant and left lower quadrant. These are both likely extrinsic to the patient. Safety pins  are also present. Electronically Signed   By: Lorriane Shire M.D.   On: 08/27/2019 14:51   EEG adult  Result Date: 08/27/2019 Lora Havens, MD     08/27/2019  6:22 PM Patient Name: KALOYAN ACREE MRN: [id number] Epilepsy Attending: Lora Havens Referring Physician/Provider: Laurey Morale, NP Date: 08/27/2019 Duration: 30.61mins Patient history: 63yo F with sudden onset of ams. EEG to evaluate for seizure. Level of alertness: awake, asleep AEDs during EEG study: None Technical aspects: This EEG study was done with scalp electrodes positioned according to the 10-20 International system of electrode placement. Electrical activity was acquired at a sampling rate of 500Hz  and reviewed with a high frequency filter of 70Hz  and a low frequency filter of 1Hz . EEG data were recorded continuously and digitally stored. DESCRIPTION: During awake state, the posterior dominant rhythm consists of 8-9 Hz activity of moderate voltage (25-35 uV) seen predominantly in posterior head regions, symmetric and reactive to eye opening and eye closing. Sleep was characterized by sleep spindles (12-14Hz ), maximal frontocentral, and intermittent generalized 2-3Hz  delta slowing.       Hyperventilation and photic stimulation were not performed. IMPRESSION: This study is within normal limits. No seizures or epileptiform discharges were seen throughout the recording. Lora Havens   CT Angio Chest/Abd/Pel for Dissection W and/or W/WO  Result Date: 08/27/2019 CLINICAL DATA:  Acute chest and back pain with hypertension EXAM: CT ANGIOGRAPHY CHEST, ABDOMEN AND PELVIS TECHNIQUE: Multidetector CT imaging through the chest, abdomen and pelvis was performed using the standard protocol during bolus administration of intravenous contrast. Multiplanar reconstructed images and MIPs were obtained and reviewed to evaluate the vascular anatomy. CONTRAST:  175mL OMNIPAQUE IOHEXOL 350 MG/ML SOLN COMPARISON:  None. FINDINGS: CTA CHEST FINDINGS  Cardiovascular: Initial precontrast images demonstrate no hyperdense crescent within the thoracic aorta. The thoracic aorta and its branches demonstrate atherosclerotic calcifications and changes of prior coronary bypass grafting. No evidence of aneurysmal dilatation or dissection is seen. Heavy coronary calcifications are noted. The heart is mildly enlarged in size. The pulmonary artery is well visualized within normal branching pattern. No focal filling defect to suggest pulmonary embolism is seen. Mediastinum/Nodes: Thoracic inlet is within normal limits. No hilar or mediastinal adenopathy is seen. The esophagus as visualized is within normal limits. Lungs/Pleura: Emphysematous changes are noted in the lungs. Some mild scarring is noted in the right lung apex. No focal infiltrate is seen. Small effusions are noted bilaterally. No sizable parenchymal nodules are seen. Musculoskeletal: The bony structures show degenerative change of the thoracic spine. No acute bony abnormality is noted. Review of the MIP images confirms the above findings. CTA ABDOMEN AND PELVIS FINDINGS VASCULAR Aorta: Abdominal aorta demonstrates atherosclerotic calcifications. No aneurysmal dilatation or dissection is seen. Celiac: Patent without evidence of aneurysm, dissection, vasculitis or significant stenosis. SMA: Patent without evidence of aneurysm, dissection, vasculitis or significant stenosis. Renals: Both renal arteries are patent without evidence of aneurysm, dissection, vasculitis, fibromuscular dysplasia or significant stenosis. IMA: Patent  without evidence of aneurysm, dissection, vasculitis or significant stenosis. Iliacs: Iliac arteries demonstrate diffuse calcification without aneurysmal dilatation or dissection. No focal hemodynamically significant stenosis is noted. Veins: No specific vein abnormality is noted. Review of the MIP images confirms the above findings. NON-VASCULAR Hepatobiliary: Liver is well visualized and  within normal limits. Gallbladder demonstrates multiple dependent gallstones without obstructive change. Pancreas: Unremarkable. No pancreatic ductal dilatation or surrounding inflammatory changes. Spleen: Normal in size without focal abnormality. Adrenals/Urinary Tract: Adrenal glands are within normal limits. Kidneys are well visualized bilaterally. No calculi or obstructive changes are noted. 17 mm left upper pole renal cyst is noted. Normal enhancement is seen bilaterally. The bladder is partially distended with opacified and unopacified urine. Stomach/Bowel: The appendix is within normal limits. No obstructive or inflammatory changes of the colon are seen. No small bowel or gastric abnormality is noted. Lymphatic: No significant lymphadenopathy is seen. Reproductive: Prostate is unremarkable. Other: No abdominal wall hernia or abnormality. No abdominopelvic ascites. Musculoskeletal: Degenerative changes of the lumbar spine seen. Review of the MIP images confirms the above findings. IMPRESSION: No evidence of aortic dilatation or dissection. No evidence of pulmonary emboli. Cholelithiasis without complicating factors. Chronic changes in the chest and abdomen without acute abnormality. Electronically Signed   By: Inez Catalina M.D.   On: 08/27/2019 19:52   CT HEAD CODE STROKE WO CONTRAST  Result Date: 08/27/2019 CLINICAL DATA:  Code stroke. 63 year old male is unresponsive. Weakness. EXAM: CT HEAD WITHOUT CONTRAST TECHNIQUE: Contiguous axial images were obtained from the base of the skull through the vertex without intravenous contrast. COMPARISON:  Head CT 04/01/2018. Brain MRI 04/01/2018. FINDINGS: Brain: Stable cerebral volume since 2019. No midline shift, ventriculomegaly, mass effect, evidence of mass lesion, intracranial hemorrhage or evidence of cortically based acute infarction. Patchy periventricular white matter hypodensity appears stable. Subtle new cortical hypodensity in the anterior and  posterior left MCA territory seen on series 3, image 22 and sagittal image 42. There is also a small new round hypodensity in the left posterior lentiform on image 18. But no other CT changes of acute ischemia identified. Vascular: Calcified atherosclerosis at the skull base. No suspicious intracranial vascular hyperdensity. Skull: No acute osseous abnormality identified. Sinuses/Orbits: Visualized paranasal sinuses and mastoids are stable and well pneumatized. Other: No acute orbit or scalp soft tissue findings. There is some calcified scalp vessel atherosclerosis. ASPECTS [facility] Stroke Program Early CT Score) Total score (0-10 with 10 being normal): 7; given the scattered distribution of the 3 small hypodense areas in the left MCA territory. IMPRESSION: 1. 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 No associated hemorrhage or mass effect. 2. Elsewhere stable non contrast CT appearance of the brain since 2019. 3. Given the distribution of lesions in #1 ASPECTS would be 7. 4. These results were communicated to Dr. Cheral Marker at 1:42 pm on 08/27/2019 by text page via the [facility] messaging system. Electronically Signed   By: Genevie Ann M.D.   On: 08/27/2019 13:42   Medications: . [START ON 08/30/2019] ferric gluconate (FERRLECIT/NULECIT) IV     . amLODipine  10 mg Oral QHS  . apixaban  5 mg Oral BID  . atorvastatin  80 mg Oral Daily  . Chlorhexidine Gluconate Cloth  6 each Topical Q0600  . cinacalcet  30 mg Oral Q M,W,F-HD  . citalopram  20 mg Oral Q lunch  . [START ON 09/04/2019] darbepoetin (ARANESP) injection - DIALYSIS  40 mcg Intravenous Q Mon-HD  . doxercalciferol  1 mcg Intravenous Q M,W,F-HD  . hydrALAZINE  25 mg Oral Q8H  . insulin aspart  0-5 Units Subcutaneous QHS  . insulin aspart  0-9 Units Subcutaneous TID WC  . insulin aspart  2 Units Subcutaneous TID WC  . insulin glargine  10 Units  Subcutaneous Daily  . isosorbide mononitrate  60 mg Oral Daily  . pantoprazole  40 mg Oral QAC lunch

## 2019-08-29 NOTE — Progress Notes (Signed)
PT provided discharge instructions and education. IV's removed and intact. Tele removed/ccmd notified. Vitals stable. Pt denies complaints. Pt has all belongings including tablet and phone. Pt tx via wheelchair to meet ride at [place].  Jerald Kief, RN

## 2019-08-29 NOTE — Discharge Instructions (Signed)
Do NOT take baclofen for your back pain. This medicine is causes confusion for you because of your kidney condition.  Take tylenol as needed for your back pain      Information on my medicine - ELIQUIS (apixaban)  Why was Eliquis prescribed for you? Eliquis was prescribed for you to reduce the risk of a blood clot forming that can cause a stroke if you have a medical condition called atrial fibrillation (a type of irregular heartbeat).  What do You need to know about Eliquis ? Take your Eliquis TWICE DAILY - one tablet in the morning and one tablet in the evening with or without food. If you have difficulty swallowing the tablet whole please discuss with your pharmacist how to take the medication safely.  Take Eliquis exactly as prescribed by your doctor and DO NOT stop taking Eliquis without talking to the doctor who prescribed the medication.  Stopping may increase your risk of developing a stroke.  Refill your prescription before you run out.  After discharge, you should have regular check-up appointments with your healthcare provider that is prescribing your Eliquis.  In the future your dose may need to be changed if your kidney function or weight changes by a significant amount or as you get older.  What do you do if you miss a dose? If you miss a dose, take it as soon as you remember on the same day and resume taking twice daily.  Do not take more than one dose of ELIQUIS at the same time to make up a missed dose.  Important Safety Information A possible side effect of Eliquis is bleeding. You should call your healthcare provider right away if you experience any of the following: ? Bleeding from an injury or your nose that does not stop. ? Unusual colored urine (red or dark brown) or unusual colored stools (red or black). ? Unusual bruising for unknown reasons. ? A serious fall or if you hit your head (even if there is no bleeding).  Some medicines may interact with  Eliquis and might increase your risk of bleeding or clotting while on Eliquis. To help avoid this, consult your healthcare provider or pharmacist prior to using any new prescription or non-prescription medications, including herbals, vitamins, non-steroidal anti-inflammatory drugs (NSAIDs) and supplements.  This website has more information on Eliquis (apixaban): http://www.eliquis.com/eliquis/home

## 2019-08-30 ENCOUNTER — Other Ambulatory Visit (HOSPITAL_COMMUNITY): Payer: Self-pay | Admitting: Cardiology

## 2019-09-05 ENCOUNTER — Other Ambulatory Visit (HOSPITAL_COMMUNITY)
Admission: RE | Admit: 2019-09-05 | Discharge: 2019-09-05 | Disposition: A | Discharge status: Home / Self Care | Payer: Medicare Other | Source: Ambulatory Visit | Attending: Vascular Surgery | Admitting: Vascular Surgery

## 2019-09-05 DIAGNOSIS — Z01812 Encounter for preprocedural laboratory examination: Secondary | ICD-10-CM | POA: Diagnosis present

## 2019-09-05 DIAGNOSIS — Z20828 Contact with and (suspected) exposure to other viral communicable diseases: Secondary | ICD-10-CM | POA: Insufficient documentation

## 2019-09-05 LAB — SARS CORONAVIRUS 2 (TAT 6-24 HRS): SARS Coronavirus 2: NEGATIVE

## 2019-09-05 NOTE — Progress Notes (Signed)
Patient does not have shortness of breath, fever, cough and chest pain.  PCP - Dr Di Kindle Cardiologist - Dr Loralie Champagne  Chest x-ray - 08-27-19 (1 view) EKG - 04-17-19 Stress Test - 11/15/18 ECHO - 04/14/19 Cardiac Cath - 04/17/19  On Novolog Insulin Pump.  Instructed patient to decrease basal rate by 20%. Pt stated understanding.   If your blood sugar is less than 70 mg/dL, you will need to treat for low blood sugar: ? Treat a low blood sugar (less than 70 mg/dL) with  cup of clear juice (cranberry or apple), 4glucose tablets, OR glucose gel. ? Recheck blood sugar in 15 minutes after treatment (to make sure it is greater than 70 mg/dL). If your blood sugar is not greater than 70 mg/dL on recheck, call [phone] for further instructions.  Blood Thinner Instructions: Hold Eliquis 3 days prior to surgery per MD.      Instructed patient to take the following medications on DOS: hydralazine, Imdur, Tylenol (if needed)  Anesthesia review: Yes  STOP now taking any Aspirin (unless otherwise instructed by your surgeon), Aleve, Naproxen, Ibuprofen, Motrin, Advil, Goody's, BC's, all herbal medications, fish oil, and all vitamins.   Patient and wife [name]) stated understanding of instructions given for day of surgery.

## 2019-09-06 MED ORDER — CEFAZOLIN SODIUM-DEXTROSE 2-4 GM/100ML-% IV SOLN
2.0000 g | INTRAVENOUS | Status: AC
  Administered 2019-09-07: 2 g via INTRAVENOUS
  Filled 2019-09-06: qty 100

## 2019-09-06 NOTE — Anesthesia Preprocedure Evaluation (Addendum)
Anesthesia Evaluation  Patient identified by MRN, date of birth, ID band Patient awake    Reviewed: Allergy & Precautions, NPO status , Patient's Chart, lab work & pertinent test results  Airway Mallampati: I  TM Distance: >3 FB Neck ROM: Full    Dental   Pulmonary former smoker,    Pulmonary exam normal        Cardiovascular hypertension, Pt. on medications + Past MI and + Cardiac Stents  Normal cardiovascular exam     Neuro/Psych Depression    GI/Hepatic GERD  Medicated and Controlled,  Endo/Other  diabetes  Renal/GU ESRF and DialysisRenal disease     Musculoskeletal   Abdominal   Peds  Hematology   Anesthesia Other Findings   Reproductive/Obstetrics                             Anesthesia Physical Anesthesia Plan  ASA: III  Anesthesia Plan: MAC   Post-op Pain Management:    Induction: Intravenous  PONV Risk Score and Plan: Ondansetron  Airway Management Planned: Nasal Cannula  Additional Equipment:   Intra-op Plan:   Post-operative Plan:   Informed Consent: I have reviewed the patients History and Physical, chart, labs and discussed the procedure including the risks, benefits and alternatives for the proposed anesthesia with the patient or authorized representative who has indicated his/her understanding and acceptance.       Plan Discussed with: CRNA and Surgeon  Anesthesia Plan Comments: (PAT note written 09/06/2019 by Myra Gianotti, PA-C. )       Anesthesia Quick Evaluation

## 2019-09-06 NOTE — Progress Notes (Signed)
Anesthesia Chart Review: Kenneth [name]   Case: [id number] Date/Time: 09/07/19 0715   Procedure: REVISON OF LEFT UPPER ARM ARTERIOVENOUS FISTULA (Left )   Anesthesia type: Monitor Anesthesia Care   Pre-op diagnosis: END STAGE RENAL DISEASE. MECHANICAL COMPLICAITON OF ARTERIOVENOUS FISTULA   [facility] RO[facility] 10 / [facility]   Surgeons: Rosetta Posner, MD      DISCUSSION: DISCUSSION: Patient is a [place] speaking 63 year old male scheduled for the above procedure. 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 Also with history is significant for CAD with last cath 04/17/2019 showing 1/3 patent grafts and no PCI targets, so medical therapy recommended.   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  - Admitted 08/27/19-08/29/19 with lethargy/unresponsiveness and hypertension. CT negative for acute infarct, CTA negative for PE, EEG negative for seizure. Neurology felt patient likely had acute metabolic encephalopathy related to sedating medications in setting of ESRD (had recently started baclofen for back pain and also on gabapentin), back to baseline at discharge.    Last cardiology follow-up with Dr. Aundra Dubin 04/27/19. No further chest pain, but felt fatigues and weak most of the time. No SOB, orthopnea, PND, palpitations at that time. Six month follow-up planned. Plavix discontinued as last PCI > 1 year. Continued on  Eliquis and Imdur.   Patient reported instructions to hold Eliquis x 3 days prior to surgery.  He has an insulin pump (Novolog 100 Unit/ml per record).  09/05/19 COVID-19 test negative. He is a same day work-up, so labs and further evaluation by anesthesia team on the day of surgery.    PROVIDERS: [name], MD is PCP Loralie Champagne, MD is HF cardiologist. Last visit 04/27/19.    LABS: He is for ISTAT on arrival. As of 08/29/19, A1c 6.8%, H/H 9.7/28.8.   OTHER: EEG 08/27/19: IMPRESSION: This study is within normal limits. No seizures or epileptiform discharges were seen throughout the recording.   IMAGING: CTA Chest 08/27/19: IMPRESSION: No evidence of aortic dilatation or dissection. No evidence of pulmonary emboli. Cholelithiasis without complicating factors. Chronic changes in the chest and abdomen without acute abnormality.  CTA head/neck 08/27/19: IMPRESSION: 1. Negative for emergent large vessel occlusion. Patent basilar artery and unchanged posterior circulation since 2019. 2. CT Perfusion does not detect ischemic penumbra or core infarct. 3. Positive for chronic occlusion of the right vertebral artery, unchanged. 4. Positive also for chronic calcified ICA siphons with moderate left and mild to moderate right supraclinoid stenosis. 5. Aortic Atherosclerosis (ICD10-I70.0) and Emphysema (ICD10-J43.9).   EKG: 08/27/19: Sinus rhythm Probable left atrial enlargement LVH with secondary repolarization abnormality Abnormal inferior Q waves Anterior ST elevation, probably due to LVH Borderline prolonged QT interval Confirmed by Sherwood Gambler [phone]) on 08/27/2019 1:23:41 PM   CV:  Cardiac cath 04/17/19: Left Anterior Descending: 90% proximal LAD stenosis, subtotal occlusion of the mid LAD.  LIMA-LAD is patent. Ramus Intermedius: Large ramus with a long stented segment in the proximal to mid vessel.  40 to 50%  in-stent restenosis in the distal stented  segment.  Totally occluded SVG-ramus. Left Circumflex: The AV LCx is small in caliber, 80% ostial stenosis. Right Coronary Artery: Occluded proximal RCA.  The SVG-RCA is known to be occluded from prior caths and was not injected.  There were extensive left to right collaterals. CONCLUSION: 1. Occluded SVG-RCA and SVG-ramus, patent LIMA-LAD.  2. The large ramus was patent with 40-50% mid ramus stenosis (in-stent restenosis).  3. 80% ostial stenosis very small caliber AV LCx.  Not interventional target.  4. Occluded RCA (known from prior) with collaterals.  - No target for intervention. Aggressive medical management of angina.   Echo 04/14/19: IMPRESSIONS 1. Mild hypokinesis of the left ventricular, basal inferolateral wall. 2. The left ventricle has low normal systolic function, with an ejection fraction of 50-55%. The cavity size was normal. There is moderate concentric left ventricular hypertrophy. Left ventricular diastolic Doppler parameters are consistent with  pseudonormalization. 3. The right ventricle has normal systolic function. The cavity was normal. There is no increase in right ventricular wall thickness. Right ventricular systolic pressure could not be assessed. 4. Left atrial size was mildly dilated. 5. Right atrial size was mildly dilated. 6. No evidence of mitral valve stenosis. 7. The aortic valve is tricuspid. Mild thickening of the aortic valve. Mild calcification of the aortic valve. No stenosis of the aortic valve. 8. The aorta is normal in size and structure. 9. The aortic root is normal in size and structure. SUMMARY Compared to prior, EF is similar to slightly improved, wall motion is only mildy hypokinetic in basal inferolateral wall.   Carotid US 09/15/18: Summary: - Right Carotid: Non-hemodynamically significant plaque <50% noted in the CCA. The ECA appears >50% stenosed. The extracranial vessels were near-normal with  only minimal wall thickening or plaque. - Left Carotid: Velocities in the left ICA are consistent with a 1-39% stenosis. Non-hemodynamically significant plaque noted in the CCA. - Vertebrals: Left vertebral artery demonstrates antegrade flow. Right vertebral artery demonstrates probable occlusion. - Subclavians: Normal flow hemodynamics were seen in the right subclavian artery. The left subclavian artery has normal low resistance and turbulent flow due to AVF.   Past Medical History:  Diagnosis Date  . Acute pulmonary edema ([place]) 05/31/2016  . Acute respiratory failure with hypoxemia ([place]) 05/31/2016  . CAD (coronary artery disease) 10/30/2016   NSTEMI 9/17 with CABG x 3 (LIMA-LAD, SVG-OM, SVG-RCA).   - NSTEMI 10/18 s/p DES to ostial SVG to  OM  . Depression   . Diabetic foot ulcer ([place]) 04/11/2017  . 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 ESRD (end stage renal disease) on dialysis [facility])    "MWF; [place]" (03/30/2018)  . Gastroesophageal reflux disease 08/18/2016  . HCAP (healthcare-associated pneumonia)   . Hemodialysis status ([place])   . Hyperlipidemia   . Hypertension   . Hypertensive heart disease 09/12/2017  . Ischemic rest pain of lower extremity 02/06/2015  . 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 NSTEMI (non-ST elevated myocardial infarction) ([place])   . PAF (paroxysmal atrial fibrillation) ([place])   . Pleural effusion   . Pronation deformity of both feet 02/27/2015    Past Surgical History:  Procedure Laterality Date  . AV FISTULA PLACEMENT Left 06/22/2016   Procedure: ARTERIOVENOUS (AV) FISTULA CREATION LEFT UPPER ARM;  Surgeon: Rosetta Posner, MD;  Location: [place];  Service: Vascular;  Laterality: Left;  . CARDIAC CATHETERIZATION N/A 05/31/2016   Procedure: Left Heart Cath and  Coronary Angiography;  Surgeon: Jettie Booze, MD;  Location: [place] CV LAB;  Service: Cardiovascular;   Laterality: N/A;  . CARDIAC CATHETERIZATION N/A 05/31/2016   Procedure: Right Heart Cath;  Surgeon: Jettie Booze, MD;  Location: [place] CV LAB;  Service: Cardiovascular;  Laterality: N/A;  . CARDIAC CATHETERIZATION N/A 05/31/2016   Procedure: IABP Insertion;  Surgeon: Jettie Booze, MD;  Location: [place] CV LAB;  Service: Cardiovascular;  Laterality: N/A;  . CORONARY ARTERY BYPASS GRAFT N/A 06/05/2016   Procedure: CORONARY ARTERY BYPASS GRAFTING (CABG) x3 LIMA to LAD -SVG to OM -SVG to RCA;  Surgeon: Ivin Poot, MD;  Location: [place];  Service: Open Heart Surgery;  Laterality: N/A;  . CORONARY STENT INTERVENTION N/A 07/07/2017   Procedure: CORONARY STENT INTERVENTION;  Surgeon: Wellington Hampshire, MD;  Location: [place] CV LAB;  Service: Cardiovascular;  Laterality: N/A;  . CORONARY STENT INTERVENTION N/A 03/30/2018   Procedure: CORONARY STENT INTERVENTION;  Surgeon: Martinique, Peter M, MD;  Location: [place] CV LAB;  Service: Cardiovascular;  Laterality: N/A;  . INSERTION OF DIALYSIS CATHETER N/A 06/05/2016   Procedure: INSERTION OF DIALYSIS/trialysis CATHETER;  Surgeon: Ivin Poot, MD;  Location: [place];  Service: Vascular;  Laterality: N/A;  . INSERTION OF DIALYSIS CATHETER Right 06/13/2016   Procedure: INSERTION OF DIALYSIS CATHETER RIGHT INTERNAL JUGULAR;  Surgeon: Conrad Tillatoba, MD;  Location: [place];  Service: Vascular;  Laterality: Right;  . INTRAOPERATIVE TRANSESOPHAGEAL ECHOCARDIOGRAM N/A 06/05/2016   Procedure: INTRAOPERATIVE TRANSESOPHAGEAL ECHOCARDIOGRAM;  Surgeon: Ivin Poot, MD;  Location: [place];  Service: Open Heart Surgery;  Laterality: N/A;  . LEFT HEART CATH AND CORS/GRAFTS ANGIOGRAPHY N/A 11/03/2016   Procedure: Left Heart Cath and Cors/Grafts Angiography;  Surgeon: Troy Sine, MD;  Location: [place] CV LAB;  Service: Cardiovascular;  Laterality: N/A;  . LEFT HEART CATH AND CORS/GRAFTS ANGIOGRAPHY N/A 07/07/2017   Procedure: LEFT HEART  CATH AND CORS/GRAFTS ANGIOGRAPHY;  Surgeon: Larey Dresser, MD;  Location: [place] CV LAB;  Service: Cardiovascular;  Laterality: N/A;  . LEFT HEART CATH AND CORS/GRAFTS ANGIOGRAPHY N/A 03/30/2018   Procedure: LEFT HEART CATH AND CORS/GRAFTS ANGIOGRAPHY;  Surgeon: Martinique, Peter M, MD;  Location: [employer] CV LAB;  Service: Cardiovascular;  Laterality: N/A;  . LEFT HEART CATH AND CORS/GRAFTS ANGIOGRAPHY N/A 04/17/2019   Procedure: LEFT HEART CATH AND CORS/GRAFTS ANGIOGRAPHY;  Surgeon: Larey Dresser, MD;  Location: [place] CV LAB;  Service: Cardiovascular;  Laterality: N/A;  . REVISION OF ARTERIOVENOUS GORETEX GRAFT Left [id number]   Procedure: PLICATION OF ANEURYSM OF ARTERIOVENOUS FISTULA  LEFT ARM;  Surgeon: Rosetta Posner, MD;  [facility];  Service: Vascular;  Laterality: Left;    MEDICATIONS: . [START ON 09/07/2019] ceFAZolin (ANCEF) IVPB 2g/100 mL premix   . acetaminophen (TYLENOL) 500 MG tablet  . amLODipine (NORVASC) 10 MG tablet  . apixaban (ELIQUIS) 5 MG TABS tablet  . Ascorbic Acid (VITAMIN C) 1000 MG tablet  . atorvastatin (LIPITOR) 80 MG tablet  . calcium acetate (PHOSLO) 667 MG capsule  . citalopram (CELEXA) 20 MG tablet  . ezetimibe (ZETIA) 10 MG tablet  . gabapentin (NEURONTIN) 100 MG capsule  . hydrALAZINE (APRESOLINE) 25 MG tablet  . Insulin Human (INSULIN PUMP) SOLN  . isosorbide mononitrate (IMDUR) 60 MG 24 hr tablet  . losartan (COZAAR) 100 MG tablet  . metoprolol succinate (TOPROL-XL) 25 MG 24 hr tablet  . NOVOLOG 100 UNIT/ML injection  . pantoprazole (  PROTONIX) 40 MG tablet    Myra Gianotti, PA-C Surgical Short Stay/Anesthesiology [facility] Phone [phone] [facility] Phone [phone] 09/06/2019 10:04 AM

## 2019-09-07 ENCOUNTER — Ambulatory Visit (HOSPITAL_COMMUNITY): Payer: Medicare Other

## 2019-09-07 ENCOUNTER — Other Ambulatory Visit: Payer: Self-pay

## 2019-09-07 ENCOUNTER — Ambulatory Visit (HOSPITAL_COMMUNITY): Payer: Medicare Other | Admitting: Physician Assistant

## 2019-09-07 ENCOUNTER — Ambulatory Visit (HOSPITAL_COMMUNITY)
Admission: RE | Admit: 2019-09-07 | Discharge: 2019-09-07 | Disposition: A | Discharge status: Home / Self Care | Payer: Medicare Other | Attending: Vascular Surgery | Admitting: Vascular Surgery

## 2019-09-07 ENCOUNTER — Encounter (HOSPITAL_COMMUNITY)
Admission: RE | Disposition: A | Discharge status: Home / Self Care | Payer: Self-pay | Source: Home / Self Care | Attending: Vascular Surgery

## 2019-09-07 ENCOUNTER — Encounter (HOSPITAL_COMMUNITY): Payer: Self-pay | Admitting: Vascular Surgery

## 2019-09-07 DIAGNOSIS — T82510A Breakdown (mechanical) of surgically created arteriovenous fistula, initial encounter: Secondary | ICD-10-CM | POA: Insufficient documentation

## 2019-09-07 DIAGNOSIS — Z794 Long term (current) use of insulin: Secondary | ICD-10-CM | POA: Diagnosis not present

## 2019-09-07 DIAGNOSIS — F329 Major depressive disorder, single episode, unspecified: Secondary | ICD-10-CM | POA: Insufficient documentation

## 2019-09-07 DIAGNOSIS — Z992 Dependence on renal dialysis: Secondary | ICD-10-CM | POA: Diagnosis not present

## 2019-09-07 DIAGNOSIS — Z79899 Other long term (current) drug therapy: Secondary | ICD-10-CM | POA: Diagnosis not present

## 2019-09-07 DIAGNOSIS — J439 Emphysema, unspecified: Secondary | ICD-10-CM | POA: Insufficient documentation

## 2019-09-07 DIAGNOSIS — N186 End stage renal disease: Secondary | ICD-10-CM | POA: Diagnosis not present

## 2019-09-07 DIAGNOSIS — I252 Old myocardial infarction: Secondary | ICD-10-CM | POA: Diagnosis not present

## 2019-09-07 DIAGNOSIS — E1022 Type 1 diabetes mellitus with diabetic chronic kidney disease: Secondary | ICD-10-CM | POA: Diagnosis not present

## 2019-09-07 DIAGNOSIS — I25119 Atherosclerotic heart disease of native coronary artery with unspecified angina pectoris: Secondary | ICD-10-CM | POA: Insufficient documentation

## 2019-09-07 DIAGNOSIS — I6501 Occlusion and stenosis of right vertebral artery: Secondary | ICD-10-CM | POA: Insufficient documentation

## 2019-09-07 DIAGNOSIS — I700 Atherosclerosis of aorta: Secondary | ICD-10-CM | POA: Diagnosis not present

## 2019-09-07 DIAGNOSIS — Y841 Kidney dialysis as the cause of abnormal reaction of the patient, or of later complication, without mention of misadventure at the time of the procedure: Secondary | ICD-10-CM | POA: Diagnosis not present

## 2019-09-07 DIAGNOSIS — I120 Hypertensive chronic kidney disease with stage 5 chronic kidney disease or end stage renal disease: Secondary | ICD-10-CM | POA: Insufficient documentation

## 2019-09-07 DIAGNOSIS — E785 Hyperlipidemia, unspecified: Secondary | ICD-10-CM | POA: Diagnosis not present

## 2019-09-07 DIAGNOSIS — I480 Paroxysmal atrial fibrillation: Secondary | ICD-10-CM | POA: Insufficient documentation

## 2019-09-07 DIAGNOSIS — Z955 Presence of coronary angioplasty implant and graft: Secondary | ICD-10-CM | POA: Diagnosis not present

## 2019-09-07 DIAGNOSIS — T82898A Other specified complication of vascular prosthetic devices, implants and grafts, initial encounter: Secondary | ICD-10-CM | POA: Diagnosis not present

## 2019-09-07 DIAGNOSIS — Z95828 Presence of other vascular implants and grafts: Secondary | ICD-10-CM

## 2019-09-07 DIAGNOSIS — Z7901 Long term (current) use of anticoagulants: Secondary | ICD-10-CM | POA: Diagnosis not present

## 2019-09-07 DIAGNOSIS — K219 Gastro-esophageal reflux disease without esophagitis: Secondary | ICD-10-CM | POA: Insufficient documentation

## 2019-09-07 DIAGNOSIS — Z419 Encounter for procedure for purposes other than remedying health state, unspecified: Secondary | ICD-10-CM

## 2019-09-07 HISTORY — PX: REVISON OF ARTERIOVENOUS FISTULA: SHX6074

## 2019-09-07 HISTORY — PX: INSERTION OF DIALYSIS CATHETER: SHX1324

## 2019-09-07 LAB — COMPREHENSIVE METABOLIC PANEL
ALT: 23 U/L (ref 0–44)
AST: 20 U/L (ref 15–41)
Albumin: 3.8 g/dL (ref 3.5–5.0)
Alkaline Phosphatase: 61 U/L (ref 38–126)
Anion gap: 14 (ref 5–15)
BUN: 27 mg/dL — ABNORMAL HIGH (ref 8–23)
CO2: 28 mmol/L (ref 22–32)
Calcium: 9.4 mg/dL (ref 8.9–10.3)
Chloride: 95 mmol/L — ABNORMAL LOW (ref 98–111)
Creatinine, Ser: 6.84 mg/dL — ABNORMAL HIGH (ref 0.61–1.24)
GFR calc Af Amer: 9 mL/min — ABNORMAL LOW (ref >60)
GFR calc non Af Amer: 8 mL/min — ABNORMAL LOW (ref >60)
Glucose, Bld: 131 mg/dL — ABNORMAL HIGH (ref 70–99)
Potassium: 4.9 mmol/L (ref 3.5–5.1)
Sodium: 137 mmol/L (ref 135–145)
Total Bilirubin: 0.5 mg/dL (ref 0.3–1.2)
Total Protein: 7.1 g/dL (ref 6.5–8.1)

## 2019-09-07 LAB — CBC
HCT: 33.1 % — ABNORMAL LOW (ref 39.0–52.0)
Hemoglobin: 10.7 g/dL — ABNORMAL LOW (ref 13.0–17.0)
MCH: 31.6 pg (ref 26.0–34.0)
MCHC: 32.3 g/dL (ref 30.0–36.0)
MCV: 97.6 fL (ref 80.0–100.0)
Platelets: 148 10*3/uL — ABNORMAL LOW (ref 150–400)
RBC: 3.39 MIL/uL — ABNORMAL LOW (ref 4.22–5.81)
RDW: 15.2 % (ref 11.5–15.5)
WBC: 5 10*3/uL (ref 4.0–10.5)
nRBC: 0 % (ref 0.0–0.2)

## 2019-09-07 LAB — GLUCOSE, CAPILLARY
Glucose-Capillary: 137 mg/dL — ABNORMAL HIGH (ref 70–99)
Glucose-Capillary: 111 mg/dL — ABNORMAL HIGH (ref 70–99)

## 2019-09-07 LAB — POCT I-STAT, CHEM 8
BUN: 27 mg/dL — ABNORMAL HIGH (ref 8–23)
Calcium, Ion: 1.16 mmol/L (ref 1.15–1.40)
Chloride: 97 mmol/L — ABNORMAL LOW (ref 98–111)
Creatinine, Ser: 7 mg/dL — ABNORMAL HIGH (ref 0.61–1.24)
Glucose, Bld: 132 mg/dL — ABNORMAL HIGH (ref 70–99)
HCT: 33 % — ABNORMAL LOW (ref 39.0–52.0)
Hemoglobin: 11.2 g/dL — ABNORMAL LOW (ref 13.0–17.0)
Potassium: 4.9 mmol/L (ref 3.5–5.1)
Sodium: 136 mmol/L (ref 135–145)
TCO2: 30 mmol/L (ref 22–32)

## 2019-09-07 LAB — PROTIME-INR
INR: 1.1 (ref 0.8–1.2)
Prothrombin Time: 13.9 seconds (ref 11.4–15.2)

## 2019-09-07 SURGERY — REVISON OF ARTERIOVENOUS FISTULA
Anesthesia: Monitor Anesthesia Care | Site: Arm Upper | Laterality: Right

## 2019-09-07 MED ORDER — PROPOFOL 1000 MG/100ML IV EMUL
INTRAVENOUS | Status: AC
  Filled 2019-09-07: qty 100

## 2019-09-07 MED ORDER — CHLORHEXIDINE GLUCONATE 4 % EX LIQD: 60.0000 mL | Freq: Once | CUTANEOUS | Status: DC

## 2019-09-07 MED ORDER — HEPARIN SODIUM (PORCINE) 1000 UNIT/ML IJ SOLN
INTRAMUSCULAR | Status: AC
  Filled 2019-09-07: qty 1

## 2019-09-07 MED ORDER — ONDANSETRON HCL 4 MG/2ML IJ SOLN: 4.0000 mg | Freq: Once | INTRAMUSCULAR | Status: DC | PRN

## 2019-09-07 MED ORDER — PROPOFOL 10 MG/ML IV BOLUS
INTRAVENOUS | Status: AC
  Filled 2019-09-07: qty 20

## 2019-09-07 MED ORDER — ONDANSETRON HCL 4 MG/2ML IJ SOLN
INTRAMUSCULAR | Status: DC | PRN
  Administered 2019-09-07: 4 mg via INTRAVENOUS

## 2019-09-07 MED ORDER — MIDAZOLAM HCL 5 MG/5ML IJ SOLN
INTRAMUSCULAR | Status: DC | PRN
  Administered 2019-09-07: 1 mg via INTRAVENOUS

## 2019-09-07 MED ORDER — MEPERIDINE HCL 25 MG/ML IJ SOLN: 6.2500 mg | INTRAMUSCULAR | Status: DC | PRN

## 2019-09-07 MED ORDER — MIDAZOLAM HCL 2 MG/2ML IJ SOLN
INTRAMUSCULAR | Status: AC
  Filled 2019-09-07: qty 2

## 2019-09-07 MED ORDER — SODIUM CHLORIDE 0.9 % IR SOLN
Status: DC | PRN
  Administered 2019-09-07: 1000 mL

## 2019-09-07 MED ORDER — SODIUM CHLORIDE 0.9 % IV SOLN
INTRAVENOUS | Status: AC
  Filled 2019-09-07: qty 1.2

## 2019-09-07 MED ORDER — GLYCOPYRROLATE 0.2 MG/ML IJ SOLN
INTRAMUSCULAR | Status: DC | PRN
  Administered 2019-09-07: .2 mg via INTRAVENOUS

## 2019-09-07 MED ORDER — LIDOCAINE 2% (20 MG/ML) 5 ML SYRINGE
INTRAMUSCULAR | Status: DC | PRN
  Administered 2019-09-07: 100 mg via INTRAVENOUS

## 2019-09-07 MED ORDER — PHENYLEPHRINE HCL-NACL 10-0.9 MG/250ML-% IV SOLN
INTRAVENOUS | Status: DC | PRN
  Administered 2019-09-07: 15 ug/min via INTRAVENOUS

## 2019-09-07 MED ORDER — SODIUM CHLORIDE 0.9 % IV SOLN: INTRAVENOUS | Status: DC

## 2019-09-07 MED ORDER — LIDOCAINE-EPINEPHRINE 0.5 %-1:200000 IJ SOLN
INTRAMUSCULAR | Status: DC | PRN
  Administered 2019-09-07: 50 mL

## 2019-09-07 MED ORDER — LIDOCAINE-EPINEPHRINE 0.5 %-1:200000 IJ SOLN
INTRAMUSCULAR | Status: AC
  Filled 2019-09-07: qty 1

## 2019-09-07 MED ORDER — FENTANYL CITRATE (PF) 250 MCG/5ML IJ SOLN
INTRAMUSCULAR | Status: DC | PRN
  Administered 2019-09-07 (×3): 50 ug via INTRAVENOUS

## 2019-09-07 MED ORDER — EPHEDRINE SULFATE 50 MG/ML IJ SOLN
INTRAMUSCULAR | Status: DC | PRN
  Administered 2019-09-07: 5 mg via INTRAVENOUS
  Administered 2019-09-07: 10 mg via INTRAVENOUS
  Administered 2019-09-07: 5 mg via INTRAVENOUS
  Administered 2019-09-07: 10 mg via INTRAVENOUS

## 2019-09-07 MED ORDER — HYDROMORPHONE HCL 1 MG/ML IJ SOLN: 0.2500 mg | INTRAMUSCULAR | Status: DC | PRN

## 2019-09-07 MED ORDER — FENTANYL CITRATE (PF) 250 MCG/5ML IJ SOLN
INTRAMUSCULAR | Status: AC
  Filled 2019-09-07: qty 5

## 2019-09-07 MED ORDER — SODIUM CHLORIDE 0.9 % IV SOLN
INTRAVENOUS | Status: DC | PRN
  Administered 2019-09-07: 08:00:00 500 mL

## 2019-09-07 MED ORDER — HEPARIN SODIUM (PORCINE) 1000 UNIT/ML IJ SOLN
INTRAMUSCULAR | Status: DC | PRN
  Administered 2019-09-07: 3400 [IU]

## 2019-09-07 MED ORDER — PROPOFOL 10 MG/ML IV BOLUS
INTRAVENOUS | Status: DC | PRN
  Administered 2019-09-07: 100 mg via INTRAVENOUS

## 2019-09-07 SURGICAL SUPPLY — 34 items
ARMBAND PINK RESTRICT EXTREMIT (MISCELLANEOUS) ×3 IMPLANT
BIOPATCH RED 1 DISK 7.0 (GAUZE/BANDAGES/DRESSINGS) ×3 IMPLANT
BNDG GAUZE ELAST 4 BULKY (GAUZE/BANDAGES/DRESSINGS) ×3 IMPLANT
CANISTER SUCT 3000ML PPV (MISCELLANEOUS) ×3 IMPLANT
CANNULA VESSEL 3MM 2 BLNT TIP (CANNULA) ×3 IMPLANT
CATH PALINDROME RT-P 15FX23CM (CATHETERS) ×3 IMPLANT
CLIP LIGATING EXTRA MED SLVR (CLIP) ×3 IMPLANT
CLIP LIGATING EXTRA SM BLUE (MISCELLANEOUS) ×3 IMPLANT
COVER PROBE W GEL 5X96 (DRAPES) IMPLANT
COVER WAND RF STERILE (DRAPES) ×3 IMPLANT
DECANTER SPIKE VIAL GLASS SM (MISCELLANEOUS) ×3 IMPLANT
DERMABOND ADVANCED (GAUZE/BANDAGES/DRESSINGS) ×2
DERMABOND ADVANCED .7 DNX12 (GAUZE/BANDAGES/DRESSINGS) ×4 IMPLANT
DRAPE CHEST BREAST 15X10 FENES (DRAPES) ×3 IMPLANT
ELECT REM PT RETURN 9FT ADLT (ELECTROSURGICAL) ×3
ELECTRODE REM PT RTRN 9FT ADLT (ELECTROSURGICAL) ×2 IMPLANT
GAUZE SPONGE 4X4 12PLY STRL LF (GAUZE/BANDAGES/DRESSINGS) ×3 IMPLANT
GLOVE SS BIOGEL STRL SZ 7.5 (GLOVE) ×2 IMPLANT
GLOVE SUPERSENSE BIOGEL SZ 7.5 (GLOVE) ×1
GOWN STRL REUS W/ TWL LRG LVL3 (GOWN DISPOSABLE) ×6 IMPLANT
GOWN STRL REUS W/TWL LRG LVL3 (GOWN DISPOSABLE) ×3
KIT BASIN OR (CUSTOM PROCEDURE TRAY) ×3 IMPLANT
KIT TURNOVER KIT B (KITS) ×3 IMPLANT
NS IRRIG 1000ML POUR BTL (IV SOLUTION) ×3 IMPLANT
PACK CV ACCESS (CUSTOM PROCEDURE TRAY) ×3 IMPLANT
PAD ARMBOARD 7.5X6 YLW CONV (MISCELLANEOUS) ×6 IMPLANT
SUT ETHILON 3 0 PS 1 (SUTURE) ×6 IMPLANT
SUT PROLENE 6 0 CC (SUTURE) ×3 IMPLANT
SUT VIC AB 3-0 SH 27 (SUTURE) ×2
SUT VIC AB 3-0 SH 27X BRD (SUTURE) ×4 IMPLANT
SUT VIC AB 4-0 PS2 18 (SUTURE) ×3 IMPLANT
TOWEL GREEN STERILE (TOWEL DISPOSABLE) ×3 IMPLANT
UNDERPAD 30X30 (UNDERPADS AND DIAPERS) ×3 IMPLANT
WATER STERILE IRR 1000ML POUR (IV SOLUTION) ×3 IMPLANT

## 2019-09-07 NOTE — Anesthesia Procedure Notes (Signed)
Procedure Name: LMA Insertion Date/Time: 09/07/2019 7:37 AM Performed by: Mariea Clonts, CRNA Pre-anesthesia Checklist: Patient identified, Emergency Drugs available, Suction available and Patient being monitored Patient Re-evaluated:Patient Re-evaluated prior to induction Oxygen Delivery Method: Circle System Utilized Preoxygenation: Pre-oxygenation with 100% oxygen Induction Type: IV induction Ventilation: Mask ventilation without difficulty LMA: LMA inserted LMA Size: 4.0 Number of attempts: 1 Airway Equipment and Method: Bite block Placement Confirmation: positive ETCO2 Tube secured with: Tape Dental Injury: Teeth and Oropharynx as per pre-operative assessment

## 2019-09-07 NOTE — H&P (Signed)
Office Visit 08/22/2019  [facility], [place]outs, [place] [facility] ESRD on dialysis [facility]) Dx Wound Check; Referred by Kenneth Holstein, MD Reason for Visit     Additional Documentation Vitals:  BP 154/66(BP Location: Right Arm, Patient Position: Sitting, Cuff Size: Normal)  Pulse 68  Temp 98 F (36.7 C)  Resp 18  Ht 5\' 7"  (1.702 m)  Wt 67.1 kg  SpO2 97%  BMI 23.18 kg/m  BSA 1.78 m  Flowsheets:  Clinical Intake,  Vital Signs,  NEWS,  MEWS Score,  Anthropometrics,  Method of Visit    Encounter Info:  Billing Info,  History,  Allergies,  Detailed Report       Orthostatic Vitals Recorded in This Encounter   08/22/2019  1455        Patient Position: Sitting  BP Location: Right Arm  Cuff Size: Normal     All Notes Progress Notes by Kenneth Earing, PA-C at 08/22/2019 3:15 PM Author: Gabriel Earing, PA-C Author Type: Physician Assistant Filed: 08/22/2019 3:26 PM  Note Status: Addendum Cosign: Cosign Not Required Encounter Date: 08/22/2019  Editor: Kenneth Earing, PA-C (Physician Assistant)  Prior Versions: 1. Kenneth [name] (Physician Assistant) at 08/22/2019 3:23 PM - Signed      POST OPERATIVE OFFICE NOTE  CC: F/u for surgery  HPI: This is a 63 y.o. male who is s/p revision of LUA AVF with resection of nonviable skin and plication of vein on 123XX123 by Dr. Donnetta [name]. He presents today for follow up. His incision has healed, but he now has another area with a scab that is concerning for being at risk for bleeding. He states that the fistula is working well.  He did have c/o a sore on his right 2nd toe.  He dialyzes at the [facility] location on M/W/F.  No Known Allergies         Current Outpatient Medications  Medication Sig Dispense Refill  . acetaminophen (TYLENOL) 500 MG tablet Take 1,000 mg by mouth every 6 (six) hours as needed for mild pain or headache.     Marland Kitchen amLODipine  (NORVASC) 10 MG tablet Take 10 mg by mouth at bedtime.     Marland Kitchen apixaban (ELIQUIS) 5 MG TABS tablet Take 1 tablet (5 mg total) by mouth 2 (two) times daily. 180 tablet 1  . Ascorbic Acid (VITAMIN C) 1000 MG tablet Take 1,000 mg by mouth 3 (three) times daily.     Marland Kitchen atorvastatin (LIPITOR) 80 MG tablet Take 1 tablet by mouth once daily (Patient taking differently: Take 80 mg by mouth daily with lunch. ) 90 tablet 0  . calcium acetate (PHOSLO) 667 MG capsule Take 667 mg by mouth 3 (three) times daily with meals.     . citalopram (CELEXA) 20 MG tablet Take 20 mg by mouth daily with lunch.   1  . ezetimibe (ZETIA) 10 MG tablet Take 1 tablet by mouth once daily 90 tablet 0  . gabapentin (NEURONTIN) 100 MG capsule Take 100 mg by mouth daily with lunch.   0  . hydrALAZINE (APRESOLINE) 25 MG tablet Take 1 tablet (25 mg total) by mouth every 8 (eight) hours. 90 tablet 6  . Insulin Human (INSULIN PUMP) SOLN Inject into the skin continuous. NOVOLOG    . isosorbide mononitrate (IMDUR) 60 MG 24 hr tablet Take 1 tablet by mouth once daily (Patient  taking differently: Take 60 mg by mouth daily. ) 90 tablet 0  . losartan (COZAAR) 100 MG tablet Take 100 mg by mouth at bedtime.     . metoprolol succinate (TOPROL-XL) 25 MG 24 hr tablet TAKE 1 TABLET BY MOUTH AT BEDTIME (Patient taking differently: Take 25 mg by mouth daily. ) 90 tablet 0  . NOVOLOG 100 UNIT/ML injection Inject 6-10 Units into the skin See admin instructions. Starts with 10 units per day and adds 6 units with each meal via pump    . ofloxacin (OCUFLOX) 0.3 % ophthalmic solution Place 1 drop into both eyes 3 (three) times daily.     Marland Kitchen oxyCODONE (ROXICODONE) 5 MG immediate release tablet Take 1 tablet (5 mg total) by mouth every 6 (six) hours as needed. 8 tablet 0  . pantoprazole (PROTONIX) 40 MG tablet Take 40 mg by mouth daily before lunch.   0  No current facility-administered medications for this visit.     ROS: See HPI  Physical Exam:          Today's Vitals    08/22/19 1455   BP: (!) 154/66   Pulse: 68   Resp: 18   Temp: 98 F (36.7 C)   SpO2: 97%   Weight: 148 lb (67.1 kg)   Height: 5\' 7"  (1.702 m)    Body mass index is 23.18 kg/m.  Incision: Healed.  Extremities: The fistula has a good thrill distally but is somewhat pulsatile proximally. He does have a scab ~ 0.5-1cm proximal to his last revision. Left radial pulse is palpable. Motor and sensory are in tact. Easily palpable right DP pulse.  Assessment/Plan: This is a 63 y.o. male who is s/p:  revision of LUA AVF with resection of nonviable skin and plication of vein on 123XX123 by Dr. Donnetta [name]  -fistula has healed from previous revision a month ago. However, he has a new scab proximal to previous revision that is concerning for high risk of bleeding. Pt also examined with Dr. Donnetta [name] and it is felt that we should proceed with revision of this area. Should not need a catheter as the fistula can be stuck above and below this area.  -Please do not stick area around proximal scab-may stick above and below this area.  -he is on Eliquis and this will need to be held.  -I did advise pt and his wife how to hold pressure should this bleed and to call 911.  -he did have concerns for a sore on his right 2nd toe. This is essentially healed and he has an easily palpable right DP pulse.  Kenneth Locket, PA-C  [facility]  [phone]  Clinic MD: Kenneth [name]                Communications [facility] Provider CC Chart Rep sent to Kenneth Holstein, MD   [employer]  Electronic signature on 08/22/2019 2:45 PM - E-signed     Communication Routing History Recipient Method Sent by Date Sent  Kenneth Holstein, MD Fax Kenneth [name] 08/22/2019  Fax: [phone]  Phone: [phone]       No questionnaires available.           Orders Placed None   Medication Changes None   Medication List        Visit Diagnoses  ESRD on dialysis [facility])   Problem  List           Level of Service Level of Service  PR POST-OP FOLLOW-UP VISIT [id number]     All Charges for This Encounter Code  [id number]  Description: PR POST-OP FOLLOW-UP VISIT  Service Date: 08/22/2019  Service Provider: Gabriel Earing, PA-C  Qty: 1      Addendum:  The patient has been re-examined and re-evaluated.  [name] history and physical has been reviewed and is unchanged.    Kenneth [name] is a 63 y.o. male is being admitted with END STAGE RENAL DISEASE. MECHANICAL COMPLICAITON OF ARTERIOVENOUS FISTULA. All the risks, benefits and other treatment options have been discussed with the patient. The patient has consented to proceed with Procedure(s): REVISON OF LEFT UPPER ARM ARTERIOVENOUS FISTULA as a surgical intervention.  Kayloni Rocco 09/07/2019 7:06 AM [facility]

## 2019-09-07 NOTE — Transfer of Care (Signed)
Immediate Anesthesia Transfer of Care Note  Patient: Kenneth [name]  Procedure(s) Performed: REVISON OF LEFT UPPER ARM ARTERIOVENOUS FISTULA (Left Arm Upper) Insertion Of Dialysis Catheter (Right )  Patient Location: PACU  Anesthesia Type:General  Level of Consciousness: awake, alert  and oriented  Airway & Oxygen Therapy: Patient Spontanous Breathing and Patient connected to nasal cannula oxygen  Post-op Assessment: Report given to RN, Post -op Vital signs reviewed and stable and Patient moving all extremities X 4  Post vital signs: Reviewed and stable  Last Vitals:  Vitals Value Taken Time  BP 110/61 09/07/19 0856  Temp    Pulse 65 09/07/19 0859  Resp 7 09/07/19 0859  SpO2 100 % 09/07/19 0859  Vitals shown include unvalidated device data.  Last Pain:  Vitals:   09/07/19 0613  TempSrc:   PainSc: 0-No pain      Patients Stated Pain Goal: 3 ([id number])  Complications: No apparent anesthesia complications

## 2019-09-07 NOTE — Anesthesia Postprocedure Evaluation (Signed)
Anesthesia Post Note  Patient: Kenneth [name]  Procedure(s) Performed: REVISON OF LEFT UPPER ARM ARTERIOVENOUS FISTULA (Left Arm Upper) Insertion Of Dialysis Catheter (Right )     Patient location during evaluation: PACU Anesthesia Type: MAC Level of consciousness: awake and alert Pain management: pain level controlled Vital Signs Assessment: post-procedure vital signs reviewed and stable Respiratory status: spontaneous breathing, nonlabored ventilation, respiratory function stable and patient connected to nasal cannula oxygen Cardiovascular status: blood pressure returned to baseline and stable Postop Assessment: no apparent nausea or vomiting Anesthetic complications: no    Last Vitals:  Vitals:   09/07/19 0912 09/07/19 0915  BP: (!) 117/56   Pulse: 66 66  Resp: (!) 6 (!) 7  Temp:    SpO2: 98% 97%    Last Pain:  Vitals:   09/07/19 0930  TempSrc:   PainSc: 0-No pain                 Liora Myles DAVID

## 2019-09-07 NOTE — Op Note (Signed)
    OPERATIVE REPORT  DATE OF SURGERY: 09/07/2019  PATIENT: Kenneth [name], 63 y.o. male MRN: [id number]  DOB: [birth year]  PRE-OPERATIVE DIAGNOSIS: End-stage renal disease with erosion over left upper arm AV fistula  POST-OPERATIVE DIAGNOSIS:  Same  PROCEDURE: #1 right IJ tunneled hemodialysis catheter, #2 debridement and revision left upper arm AV fistula  SURGEON:  Curt Jews, M.D.  PHYSICIAN ASSISTANT: Risa Grill PA-C  ANESTHESIA: LMA  EBL: per anesthesia record  Total I/O In: 550 [I.V.:550] Out: 25 [Blood:25]  BLOOD ADMINISTERED: none  DRAINS: none  SPECIMEN: none  COUNTS CORRECT:  YES  PATIENT DISPOSITION:  PACU - hemodynamically stable  PROCEDURE DETAILS: Patient was brought to the operating room for revision of his left arm AV fistula.  This is been present for many years and is becoming More degeneration.  He had had area of concerned eschar formation and had revision by myself approximately 6 weeks ago.  There was an area above this and when seen in the office several weeks ago had an eschar over this and was somewhat concerning as well.  On examination I did not feel that it would be possible or over advise all these areas and still have a fistula could be used immediately.  I recommended placement of a tunneled catheter to allow the fistula to rest for 4 to 6 weeks before resumption of use  The right and left neck and chest prepped draped you sterile fashion.  Using SonoSite visualization the right internal jugular vein was easily accessed with a 18-gauge needle and a guidewire was passed down to the level of the right atrium.  Dilator and peel-away sheath was passed over the guidewire and the dilator and guidewire removed.  A 23 cm catheter was positioned the level of the distal right atrium and the peel-away sheath was removed.  The catheter was brought through subcutaneous tunnel through a separate stab incision.  The 2 lm ports were attached and both lumens  flushed and aspirated easily and were locked with 1000/cc heparin.  The catheter was secured to the skin with a 3-0 nylon stitch and the entry site was closed with 4 subcuticular Vicryl stitch.  Sterile dressing was applied.  Attention was then turned to the left arm.  The area of the more proximal fistula which was concerned and the office was debrided.  Eschar was removed and this did not appear to go deep through the skin down towards the vein and it was felt most prudent to leave this area without revision.  The area towards the antecubital space which had had recent revision did have an eschar present and this was debrided.  This did appear to go deeper.  For this reason an ellipse of skin was removed around this area back to healthy skin and the skin was then closed over the vein with interrupted 3-0 Vicryl figure-of-eight sutures.  Sterile dressing was applied and the patient was transferred to the recovery room in stable condition   Rosetta Posner, M.D., [facility] 09/07/2019 10:18 AM

## 2019-10-02 NOTE — Progress Notes (Signed)
POST OPERATIVE OFFICE NOTE    CC:  F/u for surgery  HPI:  This is a 64 y.o. male who is s/p debridement and revision of LUA AVF and insertion of TDC on 09/07/2019 by Dr. Donnetta Hutching.  Pt is doing well.  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  The distal area has sutures in place and has also healed nicely.  He does not have any pain in his left hand.  He states his catheter is working well.    The pt does not have evidence of steal sx. The pt is on dialysis M/W/F at the [facility].  No Known Allergies  Current Outpatient Medications  Medication Sig Dispense Refill  . acetaminophen (TYLENOL) 500 MG tablet Take 1,000 mg by mouth every 6 (six) hours as needed for mild pain or headache.     Marland Kitchen amLODipine (NORVASC) 10 MG tablet Take 10 mg by mouth at bedtime.     Marland Kitchen apixaban (ELIQUIS) 5 MG TABS tablet Take 1 tablet (5 mg total) by mouth 2 (two) times daily. 180 tablet 1  . Ascorbic Acid (VITAMIN C) 1000 MG tablet Take 1,000 mg by mouth 3 (three) times daily.     Marland Kitchen atorvastatin (LIPITOR) 80 MG tablet Take 1 tablet by mouth once daily (Patient taking differently: Take 80 mg by mouth daily with lunch. ) 90 tablet 0  . calcium acetate (PHOSLO) 667 MG capsule Take 667 mg by mouth 3 (three) times daily with meals.     . citalopram (CELEXA) 20 MG tablet Take 20 mg by mouth daily with lunch.   1  . ezetimibe (ZETIA) 10 MG tablet Take 1 tablet by mouth once daily 90 tablet 0  . gabapentin (NEURONTIN) 100 MG capsule Take 100 mg by mouth daily with lunch.   0  . hydrALAZINE (APRESOLINE) 25 MG tablet Take 1 tablet (25 mg total) by mouth every 8 (eight) hours. 90 tablet 6  . Insulin Human (INSULIN PUMP) SOLN Inject into the skin continuous. NOVOLOG    . isosorbide mononitrate (IMDUR) 60 MG 24 hr tablet Take 1 tablet by mouth once daily 90 tablet 0  . losartan (COZAAR) 100 MG tablet Take 100 mg by mouth at bedtime.     . metoprolol succinate (TOPROL-XL) 25 MG 24 hr tablet TAKE 1 TABLET BY  MOUTH AT BEDTIME 90 tablet 0  . NOVOLOG 100 UNIT/ML injection Inject 6-10 Units into the skin See admin instructions. Starts with 10 units per day and adds 6 units with each meal via pump    . pantoprazole (PROTONIX) 40 MG tablet Take 40 mg by mouth daily before lunch.   0   No current facility-administered medications for this visit.     ROS:  See HPI  Physical Exam:  Today's Vitals   10/03/19 1419  BP: (!) 168/66  Pulse: 67  Resp: 18  Temp: 98.8 F (37.1 C)  TempSrc: Oral  SpO2: 98%  Weight: 147 lb 11.2 oz (67 kg)  Height: 5\' 7"  (1.702 m)   Body mass index is 23.13 kg/m.   Incision:  Well healed with nylon sutures in place.  Extremities:  There is a palpable left radial and ulnar pulse.  Motor and sensory is in tact.  There is a thrill/bruit present.    Assessment/Plan:  This is a 64 y.o. male who is s/p: debridement and revision of LUA AVF and insertion of TDC on 09/07/2019 by Dr. Donnetta Hutching  -the pt does not have evidence of  steal. -the fistula/graft can be used above the incision now and may stick area where the incision is in 3 weeks.  -If pt has tunneled dialysis catheter and the access has been used successfully to the satisfaction of the dialysis center, the tunneled catheter can be removed at their discretion.   The pt is on Eliquis and he will need to be off of this for catheter removal.  -the pt will follow up as needed.   [name], PA-C [facility] [phone]  Clinic MD:  [place]

## 2019-10-03 ENCOUNTER — Other Ambulatory Visit: Payer: Self-pay

## 2019-10-03 ENCOUNTER — Ambulatory Visit (INDEPENDENT_AMBULATORY_CARE_PROVIDER_SITE_OTHER): Payer: Self-pay | Admitting: Physician Assistant

## 2019-10-03 VITALS — BP 168/66 | HR 67 | Temp 98.8°F | Resp 18 | Ht 67.0 in | Wt 147.7 lb

## 2019-10-03 DIAGNOSIS — Z992 Dependence on renal dialysis: Secondary | ICD-10-CM

## 2019-10-03 DIAGNOSIS — N186 End stage renal disease: Secondary | ICD-10-CM

## 2019-10-04 ENCOUNTER — Other Ambulatory Visit (HOSPITAL_COMMUNITY): Payer: Self-pay | Admitting: Cardiology

## 2019-10-31 ENCOUNTER — Encounter (HOSPITAL_COMMUNITY): Payer: Medicare Other | Admitting: Cardiology

## 2019-11-11 ENCOUNTER — Other Ambulatory Visit (HOSPITAL_COMMUNITY): Payer: Self-pay | Admitting: Adult Health

## 2019-11-11 ENCOUNTER — Other Ambulatory Visit (HOSPITAL_COMMUNITY): Payer: Self-pay | Admitting: Cardiology

## 2019-11-16 ENCOUNTER — Ambulatory Visit: Payer: Medicare Other | Attending: Internal Medicine

## 2019-11-16 DIAGNOSIS — Z23 Encounter for immunization: Secondary | ICD-10-CM

## 2019-11-16 NOTE — Progress Notes (Signed)
   Covid-19 Vaccination Clinic  Name:  Kenneth [name]    MRN: [id number] DOB: 1956/04/02  11/16/2019  Kenneth [name] was observed post Covid-19 immunization for 15 minutes without incident. He was provided with Vaccine Information Sheet and instruction to access the V-Safe system.   Kenneth [name] was instructed to call 911 with any severe reactions post vaccine: Marland Kitchen Difficulty breathing  . Swelling of face and throat  . A fast heartbeat  . A bad rash all over body  . Dizziness and weakness   Immunizations Administered    Name Date Dose VIS Date Route   Pfizer COVID-19 Vaccine 11/16/2019  1:25 PM 0.3 mL 08/25/2019 Intramuscular   Manufacturer: [place]   Lot: [id number]   [place]: [id number]

## 2019-11-21 ENCOUNTER — Telehealth (HOSPITAL_COMMUNITY): Payer: Self-pay | Admitting: Cardiology

## 2019-11-21 NOTE — Telephone Encounter (Signed)
Patients wife [name] left message on triage line with a requested return call   attempted to return call  No answer left message on machine

## 2019-11-24 NOTE — Telephone Encounter (Signed)
Per [name] patient is in need of an appt Does not want to want for DM next available chf follow up with NP/PA 3/31 @ 830

## 2019-12-01 ENCOUNTER — Other Ambulatory Visit (HOSPITAL_COMMUNITY): Payer: Self-pay | Admitting: Cardiology

## 2019-12-12 ENCOUNTER — Ambulatory Visit: Payer: Medicare Other | Attending: Internal Medicine

## 2019-12-12 ENCOUNTER — Encounter: Payer: Self-pay | Admitting: Gastroenterology

## 2019-12-12 ENCOUNTER — Ambulatory Visit (HOSPITAL_COMMUNITY)
Admission: RE | Admit: 2019-12-12 | Discharge: 2019-12-12 | Disposition: A | Discharge status: Home / Self Care | Payer: Medicare Other | Source: Ambulatory Visit | Attending: Cardiology | Admitting: Cardiology

## 2019-12-12 ENCOUNTER — Other Ambulatory Visit: Payer: Self-pay

## 2019-12-12 ENCOUNTER — Encounter (HOSPITAL_COMMUNITY): Payer: Self-pay

## 2019-12-12 VITALS — BP 142/64 | HR 66 | Wt 144.4 lb

## 2019-12-12 DIAGNOSIS — Z7901 Long term (current) use of anticoagulants: Secondary | ICD-10-CM | POA: Diagnosis not present

## 2019-12-12 DIAGNOSIS — N186 End stage renal disease: Secondary | ICD-10-CM | POA: Diagnosis not present

## 2019-12-12 DIAGNOSIS — I132 Hypertensive heart and chronic kidney disease with heart failure and with stage 5 chronic kidney disease, or end stage renal disease: Secondary | ICD-10-CM | POA: Insufficient documentation

## 2019-12-12 DIAGNOSIS — I5032 Chronic diastolic (congestive) heart failure: Secondary | ICD-10-CM

## 2019-12-12 DIAGNOSIS — I252 Old myocardial infarction: Secondary | ICD-10-CM | POA: Diagnosis not present

## 2019-12-12 DIAGNOSIS — I255 Ischemic cardiomyopathy: Secondary | ICD-10-CM | POA: Diagnosis not present

## 2019-12-12 DIAGNOSIS — E1122 Type 2 diabetes mellitus with diabetic chronic kidney disease: Secondary | ICD-10-CM | POA: Insufficient documentation

## 2019-12-12 DIAGNOSIS — E785 Hyperlipidemia, unspecified: Secondary | ICD-10-CM | POA: Diagnosis not present

## 2019-12-12 DIAGNOSIS — Z794 Long term (current) use of insulin: Secondary | ICD-10-CM | POA: Insufficient documentation

## 2019-12-12 DIAGNOSIS — I5042 Chronic combined systolic (congestive) and diastolic (congestive) heart failure: Secondary | ICD-10-CM | POA: Insufficient documentation

## 2019-12-12 DIAGNOSIS — R001 Bradycardia, unspecified: Secondary | ICD-10-CM | POA: Insufficient documentation

## 2019-12-12 DIAGNOSIS — Z9114 Patient's other noncompliance with medication regimen: Secondary | ICD-10-CM | POA: Insufficient documentation

## 2019-12-12 DIAGNOSIS — I2510 Atherosclerotic heart disease of native coronary artery without angina pectoris: Secondary | ICD-10-CM | POA: Insufficient documentation

## 2019-12-12 DIAGNOSIS — R1310 Dysphagia, unspecified: Secondary | ICD-10-CM | POA: Diagnosis not present

## 2019-12-12 DIAGNOSIS — Z955 Presence of coronary angioplasty implant and graft: Secondary | ICD-10-CM | POA: Diagnosis not present

## 2019-12-12 DIAGNOSIS — Z992 Dependence on renal dialysis: Secondary | ICD-10-CM | POA: Insufficient documentation

## 2019-12-12 DIAGNOSIS — Z79899 Other long term (current) drug therapy: Secondary | ICD-10-CM | POA: Diagnosis not present

## 2019-12-12 DIAGNOSIS — I25810 Atherosclerosis of coronary artery bypass graft(s) without angina pectoris: Secondary | ICD-10-CM | POA: Insufficient documentation

## 2019-12-12 DIAGNOSIS — I480 Paroxysmal atrial fibrillation: Secondary | ICD-10-CM | POA: Insufficient documentation

## 2019-12-12 DIAGNOSIS — Z23 Encounter for immunization: Secondary | ICD-10-CM

## 2019-12-12 LAB — BASIC METABOLIC PANEL
Anion gap: 13 (ref 5–15)
BUN: 20 mg/dL (ref 8–23)
CO2: 29 mmol/L (ref 22–32)
Calcium: 8.3 mg/dL — ABNORMAL LOW (ref 8.9–10.3)
Chloride: 92 mmol/L — ABNORMAL LOW (ref 98–111)
Creatinine, Ser: 7.31 mg/dL — ABNORMAL HIGH (ref 0.61–1.24)
GFR calc Af Amer: 8 mL/min — ABNORMAL LOW (ref >60)
GFR calc non Af Amer: 7 mL/min — ABNORMAL LOW (ref >60)
Glucose, Bld: 81 mg/dL (ref 70–99)
Potassium: 5 mmol/L (ref 3.5–5.1)
Sodium: 134 mmol/L — ABNORMAL LOW (ref 135–145)

## 2019-12-12 LAB — CBC
HCT: 30.6 % — ABNORMAL LOW (ref 39.0–52.0)
Hemoglobin: 10 g/dL — ABNORMAL LOW (ref 13.0–17.0)
MCH: 31.4 pg (ref 26.0–34.0)
MCHC: 32.7 g/dL (ref 30.0–36.0)
MCV: 96.2 fL (ref 80.0–100.0)
Platelets: 104 10*3/uL — ABNORMAL LOW (ref 150–400)
RBC: 3.18 MIL/uL — ABNORMAL LOW (ref 4.22–5.81)
RDW: 14.7 % (ref 11.5–15.5)
WBC: 5.5 10*3/uL (ref 4.0–10.5)
nRBC: 0 % (ref 0.0–0.2)

## 2019-12-12 NOTE — Progress Notes (Addendum)
PCP: Dr. Tasia Catchings [facility]) Cardiology: Dr. Aundra Dubin  Reason for Visit: F/u for Chronic Diastolic Heart Failure and CAD   64 y.o. 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  He was admitted in 9/17 with NSTEMI.  LHC showed 3VD and he had CABG x 3.  He had CKD stage IV when he was admitted and developed ESRD requiring HD while in the hospital.  He had transient atrial fibrillation post-CABG.  He developed cardiogenic shock requiring milrinone but was able to taper off.  Echo in 9/17 showed EF 35-40%.    Admitted 8/1 through 04/16/2017 with malignant hypertension due to medication noncompliance. Continues on HD.  HF M-W-F. Discharge weight 140 pounds.   Admitted 10/18 with chest pain and pulmonary edema. NSTEMI noted on admit. Echo showed preserved EF but + WMAs. Cath with 95% ostial SVG-OM1 stenosis with TIMI 2 flow in graft.  Patient had DES to SVG-OM1.   Admitted in 12/18 with atypical chest pain, suspect not cardiac.   NSTEMI in 7/19 with severe in-stent restenosis in SVG-OM1, had DES x 2 to native OM1.  Echo showed EF 50%.  Atrial fibrillation was noted this admission.   He has had a chronic pattern of atypical chest pain.  Cardiolite in 3/20 showed EF 54%, small fixed inferior defect with no ischemia.   He was admitted in 8/20 with severe episode of chest pain.  Hs-TnI was mildly elevated without trend.  Cath was done, showing no target for PCI and stable disease. Echo was done showing EF 50-55% with moderate LVH and normal RV.    He presents today for followup of CHF and CAD.  An interpreter is present, long w/ his wife.  He complains of increased exertional dyspnea. Denies resting dyspnea. No LEE. Compliant w/ HD, on MWF schedule. Has occasional atypical left sided chest pain, not exacerbated by exertion. EKG today shows sinus bradycardia, 58 bpm. No changes from previous EKGs.   ReDs  clip reading elevated today at 42%, c/w fluid overload.   He also complains of dysphagia. Difficulty swallowing solids. Feels like food gets stuck in distal esophagus.    Labs (12/18): LDL 64, HDL 31 Labs (7/19): LDL 36  Labs (2/20): hgb 11.3, LDL 59, HDL 28  PMH: 1. Type II diabetes 2. HTN 3. ESRD 4. CAD: NSTEMI 9/17 with CABG x 3 (LIMA-LAD, SVG-OM, SVG-RCA).   - NSTEMI 10/18 s/p DES to ostial SVG-OM - NSTEMI 7/19: severe in-stent restenosis in SVG-ramus, chronic total occlusion of SVG-RCA.  DES x 2 to native ramus.  - Cardiolite (3/20): EF 54%, small fixed inferior defect with no ischemia.  - LHC (8/20): 90% pLAD, subtotal occlusion mLAD, LIMA-LAD patent, occluded SVG-ramus, 40-50% in-stent restenosis in native ramus, 80% ostial small AV LCx (not target for intervention), occluded RCA, occluded SVG-RCA (chronic with left=>right collaterals).  No target for intervention.  5. Chronic systolic CHF: Ischemic cardiomyopathy.   - Echo (9/17) with EF 35-40%, grade II diastolic dysfunction.  - Echo (1/18) with EF 60-65%, grade II diastolic dysfunction. - Echo (10/18) with EF 55%, Akinesis of the basal-midinferolateral, inferior, and inferoseptal myocardiam. Akinesis of the basal anteroseptal myocardium.  - Echo (7/19): EF 50%, moderate LVH, mildly decreased RV systolic function.  - Echo (8/20): EF 50-55%, moderate LVH, normal RV 6. Atrial fibrillation: Paroxysmal.  Only noted post-op CABG.  7. ABIs normal in 12/17. 5/18 ABIs normal.  1/19 ABIs normal. 4/20 ABIs normal.  8. Carotid stenosis: Carotid dopplers (12/18) with < 50% right common carotid stenosis, 5-80% LICA stenosis.  - Carotid dopplers (1/20): < 50% BICA stenosis.  9. Atrial fibrillation: Paroxysmal.  10. Cerebrovascular disease: Occluded right PICA and vertebral on 7/19 CTA   SH: Married, lives in [place], 1 son, nonsmoker, no ETOH, speaks only [place].   FH: No premature CAD.  Review of systems complete and found to be negative  unless listed in HPI.    Current Outpatient Medications  Medication Sig Dispense Refill  . acetaminophen (TYLENOL) 500 MG tablet Take 1,000 mg by mouth every 6 (six) hours as needed for mild pain or headache.     Marland Kitchen amLODipine (NORVASC) 10 MG tablet Take 10 mg by mouth at bedtime.     . Ascorbic Acid (VITAMIN C) 1000 MG tablet Take 1,000 mg by mouth 3 (three) times daily.     Marland Kitchen atorvastatin (LIPITOR) 80 MG tablet Take 1 tablet by mouth once daily 90 tablet 0  . calcium acetate (PHOSLO) 667 MG capsule Take 667 mg by mouth 3 (three) times daily with meals.     . citalopram (CELEXA) 20 MG tablet Take 20 mg by mouth daily with lunch.   1  . ELIQUIS 5 MG TABS tablet Take 1 tablet by mouth twice daily 180 tablet 0  . ezetimibe (ZETIA) 10 MG tablet Take 1 tablet by mouth once daily 90 tablet 0  . gabapentin (NEURONTIN) 100 MG capsule Take 100 mg by mouth daily with lunch.   0  . hydrALAZINE (APRESOLINE) 25 MG tablet TAKE 1 TABLET BY MOUTH EVERY 8 HOURS 90 tablet 0  . Insulin Human (INSULIN PUMP) SOLN Inject into the skin continuous. NOVOLOG    . isosorbide mononitrate (IMDUR) 60 MG 24 hr tablet Take 1 tablet by mouth once daily 90 tablet 0  . losartan (COZAAR) 100 MG tablet Take 100 mg by mouth at bedtime.     . metoprolol succinate (TOPROL-XL) 25 MG 24 hr tablet TAKE 1 TABLET BY MOUTH AT BEDTIME 90 tablet 0  . NOVOLOG 100 UNIT/ML injection Inject 6-10 Units into the skin See admin instructions. Starts with 10 units per day and adds 6 units with each meal via pump    . oxyCODONE-acetaminophen (PERCOCET) 7.5-325 MG tablet Take 1 tablet by mouth every 4 (four) hours as needed for severe pain.    . pantoprazole (PROTONIX) 40 MG tablet Take 40 mg by mouth daily before lunch.   0   No current facility-administered medications for this encounter.   Vitals:   12/12/19 0849  BP: (!) 142/64  Pulse: 66  SpO2: 98%  Weight: 65.5 kg (144 lb 6.4 oz)   Wt Readings from Last 3 Encounters:  12/12/19 65.5 kg  (144 lb 6.4 oz)  10/03/19 67 kg (147 lb 11.2 oz)  09/07/19 64.5 kg (142 lb 3.2 oz)   PHYSICAL EXAM: ReDs Clip 42% General:  Well appearing. No respiratory difficulty HEENT: normal Neck: supple. no JVD. Carotids 2+ bilat; no bruits. No lymphadenopathy or thyromegaly appreciated. Cor: PMI nondisplaced. Regular rate & rhythm. No rubs, gallops or murmurs. Lungs: clear Abdomen: soft, nontender, nondistended. No hepatosplenomegaly. No bruits or masses. Good bowel sounds. Extremities: no cyanosis, clubbing, rash, edema Neuro: alert & oriented x 3, cranial nerves grossly intact. moves all 4 extremities w/o difficulty. Affect pleasant.   Assessment/Plan: 1. CAD: s/p CABG.  NSTEMI 10/18, cath with 95% ostial SVG-ramus stenosis with TIMI 2 flow  in graft, s/p DES to SVG-ramus 07/07/17. NSTEMI 7/19 with in-stent restenosis in the SVG-ramus, had DES x 2 to native ramus.   He has had chronic atypical chest pain episodes, Cardiolite in 3/20 showed no ischemia.  He had an episode of severe CP prior to last appointment, and he had repeat cath in 8/20.  This showed occluded SVG-ramus (new) but continued patency of the native ramus s/p stenting.  No target for intervention.  He has not had further ischemic CP - no longer on ASA/Plavix due to Eliquis   - Continue Imdur 60 mg daily.  - Continue atorvastatin 80 mg daily.  - Continue metoprolol 25 mg daily  2. Hyperlipidemia: Continue atorvastatin and Zetia.  Good LDL in 2/20.      3. Chronic diastolic CHF:  Ischemic cardiomyopathy.  EF low in past but improved after CABG. Most recent echo 8/20 with EF 50-55%.     - NYHA Class III symptoms. Fluid overloaded based on ReDs clip measurement, 42% - On dialysis for volume control. Next HD session is tomorrow. We have notified dialysis center of need to try to pull more fluid off, if possible.   - Continue Toprol XL 25 mg at bed time as well as losartan 100 daily.  4. Atrial fibrillation: Paroxysmal, NSR on EKG today.  HR controlled on  blocker - Continue metoprolol 25 mg daily   - Continue Eliquis. Denies abnormal bleeding. Check CBC today  5. HTN: BP mildly elevated today, suspect due to volume overload. Will not push antihypertensives, in an effort to maintain adequate BP to allow more volume removal during HD. - continue current regimen.  6. ESRD: MWF.   - as noted above, nephrology has been informed of increased volume status and need to remove more fluid during HD to relieve symptoms 7. Carotid bruit: Mild carotid disease on dopplers in 1/20. - continue statin  8. PAD: Normal ABIs in 4/20.   9. Dysphagia: Difficulties swallowing solids. Denies indigestion. Will refer to GI.    F/u in 6 weeks w/ Dr. Esmeralda Links 12/12/2019

## 2019-12-12 NOTE — Progress Notes (Signed)
Kenneth [name] and advise pt had some volume overload today and per Lyda Jester, PA pt needs to have more fluid pull off at HD tomorrow.  She states pt had 3.3 kg off yesterday and they have to watch him closely as he doesn't handle well if they pull too much off.  She states they evaluate pt tomorrow and pull as much off as possible.

## 2019-12-12 NOTE — Patient Instructions (Signed)
Labs done today, your results will be available in MyChart, we will contact you for abnormal readings.  You have been referred to [place], they will call you for an appointment  Your physician recommends that you schedule a follow-up appointment in: 6-8 weeks  If you have any questions or concerns before your next appointment please send [place] a message through mychart or call our office at [phone].  At the [facility], you and your health needs are our priority. As part of our continuing mission to provide you with exceptional heart care, we have created designated Provider Care Teams. These Care Teams include your primary Cardiologist (physician) and Advanced Practice Providers (APPs- Physician Assistants and Nurse Practitioners) who all work together to provide you with the care you need, when you need it.   You may see any of the following providers on your designated Care Team at your next follow up: Marland Kitchen Dr Glori Bickers . Dr Loralie Champagne . Darrick Grinder, NP . Lyda Jester, PA . Audry Riles, PharmD   Please be sure to bring in all your medications bottles to every appointment.

## 2019-12-12 NOTE — Progress Notes (Signed)
   Covid-19 Vaccination Clinic  Name:  Kenneth [name]    MRN: [id number] DOB: [birth year]  12/12/2019  [name] was observed post Covid-19 immunization for 15 minutes without incident. He was provided with Vaccine Information Sheet and instruction to access the V-Safe system.   [name] was instructed to call 911 with any severe reactions post vaccine: Marland Kitchen Difficulty breathing  . Swelling of face and throat  . A fast heartbeat  . A bad rash all over body  . Dizziness and weakness   Immunizations Administered    Name Date Dose VIS Date Route   Pfizer COVID-19 Vaccine 12/12/2019 10:28 AM 0.3 mL 08/25/2019 Intramuscular   Manufacturer: [place]   Lot: [id number]   [place]: [id number]

## 2019-12-12 NOTE — Progress Notes (Signed)
ReDS Vest / Clip - 12/12/19 0900      ReDS Vest / Clip   Station Marker  B    Ruler Value  26    ReDS Value Range  (!) High volume overload    ReDS Actual Value  42

## 2019-12-20 ENCOUNTER — Other Ambulatory Visit (HOSPITAL_COMMUNITY): Payer: Self-pay | Admitting: Adult Health

## 2019-12-20 ENCOUNTER — Other Ambulatory Visit (HOSPITAL_COMMUNITY): Payer: Self-pay | Admitting: Cardiology

## 2020-01-16 ENCOUNTER — Ambulatory Visit: Payer: Medicare Other | Admitting: Gastroenterology

## 2020-01-23 ENCOUNTER — Other Ambulatory Visit: Payer: Self-pay

## 2020-01-23 ENCOUNTER — Ambulatory Visit (HOSPITAL_COMMUNITY)
Admission: RE | Admit: 2020-01-23 | Discharge: 2020-01-23 | Disposition: A | Discharge status: Home / Self Care | Payer: Medicare Other | Source: Ambulatory Visit | Attending: Cardiology | Admitting: Cardiology

## 2020-01-23 VITALS — BP 144/62 | HR 59 | Wt 137.6 lb

## 2020-01-23 DIAGNOSIS — I252 Old myocardial infarction: Secondary | ICD-10-CM | POA: Insufficient documentation

## 2020-01-23 DIAGNOSIS — E1122 Type 2 diabetes mellitus with diabetic chronic kidney disease: Secondary | ICD-10-CM | POA: Insufficient documentation

## 2020-01-23 DIAGNOSIS — I2510 Atherosclerotic heart disease of native coronary artery without angina pectoris: Secondary | ICD-10-CM | POA: Diagnosis not present

## 2020-01-23 DIAGNOSIS — I25810 Atherosclerosis of coronary artery bypass graft(s) without angina pectoris: Secondary | ICD-10-CM | POA: Diagnosis not present

## 2020-01-23 DIAGNOSIS — I5042 Chronic combined systolic (congestive) and diastolic (congestive) heart failure: Secondary | ICD-10-CM | POA: Diagnosis present

## 2020-01-23 DIAGNOSIS — Z79899 Other long term (current) drug therapy: Secondary | ICD-10-CM | POA: Diagnosis not present

## 2020-01-23 DIAGNOSIS — Z7901 Long term (current) use of anticoagulants: Secondary | ICD-10-CM | POA: Diagnosis not present

## 2020-01-23 DIAGNOSIS — Z794 Long term (current) use of insulin: Secondary | ICD-10-CM | POA: Diagnosis not present

## 2020-01-23 DIAGNOSIS — I5032 Chronic diastolic (congestive) heart failure: Secondary | ICD-10-CM | POA: Diagnosis not present

## 2020-01-23 DIAGNOSIS — I132 Hypertensive heart and chronic kidney disease with heart failure and with stage 5 chronic kidney disease, or end stage renal disease: Secondary | ICD-10-CM | POA: Insufficient documentation

## 2020-01-23 DIAGNOSIS — Z955 Presence of coronary angioplasty implant and graft: Secondary | ICD-10-CM | POA: Insufficient documentation

## 2020-01-23 DIAGNOSIS — E785 Hyperlipidemia, unspecified: Secondary | ICD-10-CM | POA: Diagnosis not present

## 2020-01-23 DIAGNOSIS — I6523 Occlusion and stenosis of bilateral carotid arteries: Secondary | ICD-10-CM

## 2020-01-23 DIAGNOSIS — Z992 Dependence on renal dialysis: Secondary | ICD-10-CM | POA: Insufficient documentation

## 2020-01-23 DIAGNOSIS — I480 Paroxysmal atrial fibrillation: Secondary | ICD-10-CM | POA: Diagnosis not present

## 2020-01-23 DIAGNOSIS — I255 Ischemic cardiomyopathy: Secondary | ICD-10-CM | POA: Diagnosis not present

## 2020-01-23 DIAGNOSIS — N186 End stage renal disease: Secondary | ICD-10-CM | POA: Insufficient documentation

## 2020-01-23 DIAGNOSIS — I6529 Occlusion and stenosis of unspecified carotid artery: Secondary | ICD-10-CM | POA: Diagnosis not present

## 2020-01-23 LAB — LIPID PANEL
Cholesterol: 88 mg/dL (ref 0–200)
HDL: 36 mg/dL — ABNORMAL LOW (ref >40)
LDL Cholesterol: 29 mg/dL (ref 0–99)
Total CHOL/HDL Ratio: 2.4 RATIO
Triglycerides: 114 mg/dL (ref <150)
VLDL: 23 mg/dL (ref 0–40)

## 2020-01-23 NOTE — Patient Instructions (Signed)
Labs done today. We will contact you only if your labs are abnormal.  No medication changes were made. Please continue all current medications as prescribed.   Your physician has requested that you have a carotid duplex. This test is an ultrasound of the carotid arteries in your neck. It looks at blood flow through these arteries that supply the brain with blood. Allow one hour for this exam. There are no restrictions or special instructions. Our office will contact you at a later date to schedule this appointment.   Your physician recommends that you schedule a follow-up appointment in: 6 months. Please contact our office in October to scheduled a November appointment.  At the [facility], you and your health needs are our priority. As part of our continuing mission to provide you with exceptional heart care, we have created designated Provider Care Teams. These Care Teams include your primary Cardiologist (physician) and Advanced Practice Providers (APPs- Physician Assistants and Nurse Practitioners) who all work together to provide you with the care you need, when you need it.   You may see any of the following providers on your designated Care Team at your next follow up: Marland Kitchen Dr Glori Bickers . Dr Loralie Champagne . Darrick Grinder, NP . Lyda Jester, PA . Audry Riles, PharmD   Please be sure to bring in all your medications bottles to every appointment.

## 2020-01-23 NOTE — Progress Notes (Signed)
PCP: Dr. Tasia Catchings [facility]) Cardiology: Dr. Aundra Dubin  64 y.o. 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  He was admitted in 9/17 with NSTEMI.  LHC showed 3VD and he had CABG x 3.  He had CKD stage IV when he was admitted and developed ESRD requiring HD while in the hospital.  He had transient atrial fibrillation post-CABG.  He developed cardiogenic shock requiring milrinone but was able to taper off.  Echo in 9/17 showed EF 35-40%.    Admitted 8/1 through 04/16/2017 with malignant hypertension due to medication noncompliance. Continues on HD.  HF M-W-F. Discharge weight 140 pounds.   Admitted 10/18 with chest pain and pulmonary edema. NSTEMI noted on admit. Echo showed preserved EF but + WMAs. Cath with 95% ostial SVG-OM1 stenosis with TIMI 2 flow in graft.  Patient had DES to SVG-OM1.   Admitted in 12/18 with atypical chest pain, suspect not cardiac.   NSTEMI in 7/19 with severe in-stent restenosis in SVG-OM1, had DES x 2 to native OM1.  Echo showed EF 50%.  Atrial fibrillation was noted this admission.   He has had a chronic pattern of atypical chest pain.  Cardiolite in 3/20 showed EF 54%, small fixed inferior defect with no ischemia.   He was admitted in 8/20 with severe episode of chest pain.  Hs-TnI was mildly elevated without trend.  Cath was done, showing no target for PCI and stable disease. Echo was done showing EF 50-55% with moderate LVH and normal RV.    He presents today for followup of CHF and CAD.  He has been doing well recently, no specific complaints.  No chest pain.  No exertional dyspnea, PND, or orthopnea.  Weight is down 7 lbs. Tolerating HD without problems.   ECG (personally reviewed): NSR, lateral TWIs  Labs (12/18): LDL 64, HDL 31 Labs (7/19): LDL 36  Labs (2/20): hgb 11.3, LDL 59, HDL 28  PMH: 1. Type II diabetes 2. HTN 3. ESRD 4. CAD: NSTEMI 9/17 with CABG x 3  (LIMA-LAD, SVG-OM, SVG-RCA).   - NSTEMI 10/18 s/p DES to ostial SVG-OM - NSTEMI 7/19: severe in-stent restenosis in SVG-ramus, chronic total occlusion of SVG-RCA.  DES x 2 to native ramus.  - Cardiolite (3/20): EF 54%, small fixed inferior defect with no ischemia.  - LHC (8/20): 90% pLAD, subtotal occlusion mLAD, LIMA-LAD patent, occluded SVG-ramus, 40-50% in-stent restenosis in native ramus, 80% ostial small AV LCx (not target for intervention), occluded RCA, occluded SVG-RCA (chronic with left=>right collaterals).  No target for intervention.  5. Chronic systolic CHF: Ischemic cardiomyopathy.   - Echo (9/17) with EF 35-40%, grade II diastolic dysfunction.  - Echo (1/18) with EF 60-65%, grade II diastolic dysfunction. - Echo (10/18) with EF 55%, Akinesis of the basal-midinferolateral, inferior, and inferoseptal myocardiam. Akinesis of the basal anteroseptal myocardium.  - Echo (7/19): EF 50%, moderate LVH, mildly decreased RV systolic function.  - Echo (8/20): EF 50-55%, moderate LVH, normal RV 6. Atrial fibrillation: Paroxysmal.  Only noted post-op CABG.  7. ABIs normal in 12/17. 5/18 ABIs normal.  1/19 ABIs normal. 4/20 ABIs normal.  8. Carotid stenosis: Carotid dopplers (12/18) with < 50% right common carotid stenosis, 4-96% LICA stenosis.  - Carotid dopplers (1/20): < 50% BICA stenosis.  9. Atrial fibrillation: Paroxysmal.  10. 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 Sciatica  SH: Married, lives in  [place], 1 son, nonsmoker, no ETOH, speaks only [place].   FH: No premature CAD.  Review of systems complete and found to be negative unless listed in HPI.    Current Outpatient Medications  Medication Sig Dispense Refill  . acetaminophen (TYLENOL) 500 MG tablet Take 1,000 mg by mouth every 6 (six) hours as needed for mild pain or headache.     Marland Kitchen amLODipine (NORVASC) 10 MG tablet Take 10 mg by mouth at bedtime.     . Ascorbic Acid (VITAMIN C) 1000 MG tablet  Take 1,000 mg by mouth 3 (three) times daily.     Marland Kitchen atorvastatin (LIPITOR) 80 MG tablet Take 1 tablet by mouth once daily 90 tablet 3  . calcium acetate (PHOSLO) 667 MG capsule Take 667 mg by mouth 3 (three) times daily with meals.     . citalopram (CELEXA) 20 MG tablet Take 20 mg by mouth daily with lunch.   1  . ELIQUIS 5 MG TABS tablet Take 1 tablet by mouth twice daily 180 tablet 0  . ezetimibe (ZETIA) 10 MG tablet Take 1 tablet by mouth once daily 90 tablet 0  . gabapentin (NEURONTIN) 100 MG capsule Take 100 mg by mouth daily with lunch.   0  . hydrALAZINE (APRESOLINE) 25 MG tablet TAKE 1 TABLET BY MOUTH EVERY 8 HOURS 270 tablet 3  . Insulin Human (INSULIN PUMP) SOLN Inject into the skin continuous. NOVOLOG    . isosorbide mononitrate (IMDUR) 60 MG 24 hr tablet Take 1 tablet by mouth once daily 90 tablet 0  . losartan (COZAAR) 100 MG tablet Take 100 mg by mouth at bedtime.     . metoprolol succinate (TOPROL-XL) 25 MG 24 hr tablet TAKE 1 TABLET BY MOUTH AT BEDTIME 90 tablet 0  . NOVOLOG 100 UNIT/ML injection Inject 6-10 Units into the skin See admin instructions. Starts with 10 units per day and adds 6 units with each meal via pump    . oxyCODONE-acetaminophen (PERCOCET) 7.5-325 MG tablet Take 1 tablet by mouth every 4 (four) hours as needed for severe pain.    . pantoprazole (PROTONIX) 40 MG tablet Take 40 mg by mouth daily before lunch.   0   No current facility-administered medications for this encounter.   Vitals:   01/23/20 1046  BP: (!) 144/62  Pulse: (!) 59  SpO2: 97%  Weight: 62.4 kg (137 lb 9.6 oz)   Wt Readings from Last 3 Encounters:  01/23/20 62.4 kg (137 lb 9.6 oz)  12/12/19 65.5 kg (144 lb 6.4 oz)  10/03/19 67 kg (147 lb 11.2 oz)   General: NAD Neck: No JVD, no thyromegaly or thyroid nodule.  Lungs: Clear to auscultation bilaterally with normal respiratory effort. CV: Nondisplaced PMI.  Heart regular S1/S2, no S3/S4, no murmur.  No peripheral edema.  No carotid  bruit.  Normal pedal pulses.  Abdomen: Soft, nontender, no hepatosplenomegaly, no distention.  Skin: Intact without lesions or rashes.  Neurologic: Alert and oriented x 3.  Psych: Normal affect. Extremities: No clubbing or cyanosis.  HEENT: Normal.   Assessment/Plan: 1. CAD: s/p CABG.  NSTEMI 10/18, cath with 95% ostial SVG-ramus stenosis with TIMI 2 flow in graft, s/p DES to SVG-ramus 07/07/17. NSTEMI 7/19 with in-stent restenosis in the SVG-ramus, had DES x 2 to native ramus.   He has had chronic atypical chest pain episodes, Cardiolite in 3/20 showed no ischemia.  He had repeat cath in 8/20 showing occluded SVG-ramus (new) but continued patency of the native ramus  s/p stenting.  No target for intervention.  He has not had further chest pain.  - He will continue Eliquis so not on ASA.  - Continue Imdur 60 mg daily.  - Continue atorvastatin 80 mg daily.  2. Hyperlipidemia: Continue atorvastatin and Zetia. Check lipids today.    3. Chronic diastolic CHF:  Ischemic cardiomyopathy.  EF low in past but improved after CABG. Most recent echo 8/20 with EF 50-55%.     - On dialysis for volume control.  - Continue Toprol XL 25 mg daily as well as losartan 100 mg daily.  4. Atrial fibrillation: Paroxysmal, NSR recently.  - Continue Eliquis.   5. HTN: BP mildly high today but need to be careful to make sure he is not dropping low with HD.  No changes today.  6. ESRD: MWF.     7. Carotid stenosis: He is due for repeat carotid dopplers, will arrange.   Followup in 6 months.   Loralie Champagne 01/23/2020

## 2020-02-04 ENCOUNTER — Other Ambulatory Visit (HOSPITAL_COMMUNITY): Payer: Self-pay | Admitting: Adult Health

## 2020-02-04 ENCOUNTER — Other Ambulatory Visit (HOSPITAL_COMMUNITY): Payer: Self-pay | Admitting: Cardiology

## 2020-02-06 ENCOUNTER — Other Ambulatory Visit: Payer: Self-pay

## 2020-02-06 ENCOUNTER — Ambulatory Visit (HOSPITAL_COMMUNITY)
Admission: RE | Admit: 2020-02-06 | Discharge: 2020-02-06 | Disposition: A | Discharge status: Home / Self Care | Payer: Medicare Other | Source: Ambulatory Visit | Attending: Cardiology | Admitting: Cardiology

## 2020-02-06 DIAGNOSIS — I6529 Occlusion and stenosis of unspecified carotid artery: Secondary | ICD-10-CM | POA: Diagnosis present

## 2020-02-07 ENCOUNTER — Encounter (HOSPITAL_COMMUNITY): Payer: Medicare Other

## 2020-02-28 ENCOUNTER — Other Ambulatory Visit (HOSPITAL_COMMUNITY): Payer: Self-pay | Admitting: Cardiology

## 2020-05-25 ENCOUNTER — Other Ambulatory Visit (HOSPITAL_COMMUNITY): Payer: Self-pay | Admitting: Cardiology

## 2020-10-31 ENCOUNTER — Ambulatory Visit (HOSPITAL_COMMUNITY)
Admission: RE | Admit: 2020-10-31 | Discharge: 2020-10-31 | Disposition: A | Discharge status: Home / Self Care | Payer: Medicare Other | Source: Ambulatory Visit | Attending: Cardiology | Admitting: Cardiology

## 2020-10-31 ENCOUNTER — Other Ambulatory Visit: Payer: Self-pay

## 2020-10-31 ENCOUNTER — Encounter (HOSPITAL_COMMUNITY): Payer: Medicare Other

## 2020-10-31 ENCOUNTER — Encounter (HOSPITAL_COMMUNITY): Payer: Self-pay

## 2020-10-31 VITALS — BP 138/89 | HR 60 | Wt 137.8 lb

## 2020-10-31 DIAGNOSIS — I480 Paroxysmal atrial fibrillation: Secondary | ICD-10-CM | POA: Diagnosis not present

## 2020-10-31 DIAGNOSIS — I5032 Chronic diastolic (congestive) heart failure: Secondary | ICD-10-CM | POA: Insufficient documentation

## 2020-10-31 DIAGNOSIS — Z794 Long term (current) use of insulin: Secondary | ICD-10-CM | POA: Insufficient documentation

## 2020-10-31 DIAGNOSIS — E1122 Type 2 diabetes mellitus with diabetic chronic kidney disease: Secondary | ICD-10-CM | POA: Insufficient documentation

## 2020-10-31 DIAGNOSIS — Z955 Presence of coronary angioplasty implant and graft: Secondary | ICD-10-CM | POA: Diagnosis not present

## 2020-10-31 DIAGNOSIS — I739 Peripheral vascular disease, unspecified: Secondary | ICD-10-CM | POA: Diagnosis not present

## 2020-10-31 DIAGNOSIS — I4510 Unspecified right bundle-branch block: Secondary | ICD-10-CM | POA: Diagnosis not present

## 2020-10-31 DIAGNOSIS — I2510 Atherosclerotic heart disease of native coronary artery without angina pectoris: Secondary | ICD-10-CM | POA: Insufficient documentation

## 2020-10-31 DIAGNOSIS — I252 Old myocardial infarction: Secondary | ICD-10-CM | POA: Diagnosis not present

## 2020-10-31 DIAGNOSIS — R079 Chest pain, unspecified: Secondary | ICD-10-CM

## 2020-10-31 DIAGNOSIS — E785 Hyperlipidemia, unspecified: Secondary | ICD-10-CM

## 2020-10-31 DIAGNOSIS — Z7901 Long term (current) use of anticoagulants: Secondary | ICD-10-CM | POA: Diagnosis not present

## 2020-10-31 DIAGNOSIS — N186 End stage renal disease: Secondary | ICD-10-CM | POA: Insufficient documentation

## 2020-10-31 DIAGNOSIS — I132 Hypertensive heart and chronic kidney disease with heart failure and with stage 5 chronic kidney disease, or end stage renal disease: Secondary | ICD-10-CM | POA: Insufficient documentation

## 2020-10-31 DIAGNOSIS — E1151 Type 2 diabetes mellitus with diabetic peripheral angiopathy without gangrene: Secondary | ICD-10-CM | POA: Insufficient documentation

## 2020-10-31 DIAGNOSIS — Z79899 Other long term (current) drug therapy: Secondary | ICD-10-CM | POA: Diagnosis not present

## 2020-10-31 DIAGNOSIS — Z992 Dependence on renal dialysis: Secondary | ICD-10-CM | POA: Diagnosis not present

## 2020-10-31 DIAGNOSIS — I6523 Occlusion and stenosis of bilateral carotid arteries: Secondary | ICD-10-CM | POA: Insufficient documentation

## 2020-10-31 DIAGNOSIS — I255 Ischemic cardiomyopathy: Secondary | ICD-10-CM | POA: Diagnosis not present

## 2020-10-31 LAB — LIPID PANEL
Cholesterol: 99 mg/dL (ref 0–200)
HDL: 37 mg/dL — ABNORMAL LOW (ref >40)
LDL Cholesterol: 52 mg/dL (ref 0–99)
Total CHOL/HDL Ratio: 2.7 RATIO
Triglycerides: 50 mg/dL (ref <150)
VLDL: 10 mg/dL (ref 0–40)

## 2020-10-31 LAB — HEPATIC FUNCTION PANEL
ALT: 29 U/L (ref 0–44)
AST: 27 U/L (ref 15–41)
Albumin: 3.9 g/dL (ref 3.5–5.0)
Alkaline Phosphatase: 106 U/L (ref 38–126)
Bilirubin, Direct: 0.1 mg/dL (ref 0.0–0.2)
Indirect Bilirubin: 0.9 mg/dL (ref 0.3–0.9)
Total Bilirubin: 1 mg/dL (ref 0.3–1.2)
Total Protein: 7.4 g/dL (ref 6.5–8.1)

## 2020-10-31 NOTE — Progress Notes (Signed)
[facility] Progress Note   PCP: Dr. Tasia Catchings [facility]) Cardiology: Dr. Aundra Dubin  65 y.o. 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  He was admitted in 9/17 with NSTEMI.  LHC showed 3VD and he had CABG x 3.  He had CKD stage IV when he was admitted and developed ESRD requiring HD while in the hospital.  He had transient atrial fibrillation post-CABG.  He developed cardiogenic shock requiring milrinone but was able to taper off.  Echo in 9/17 showed EF 35-40%.    Admitted 8/1 through 04/16/2017 with malignant hypertension due to medication noncompliance. Continues on HD.  HF M-W-F. Discharge weight 140 pounds.   Admitted 10/18 with chest pain and pulmonary edema. NSTEMI noted on admit. Echo showed preserved EF but + WMAs. Cath with 95% ostial SVG-OM1 stenosis with TIMI 2 flow in graft.  Patient had DES to SVG-OM1.   Admitted in 12/18 with atypical chest pain, suspect not cardiac.   NSTEMI in 7/19 with severe in-stent restenosis in SVG-OM1, had DES x 2 to native OM1.  Echo showed EF 50%.  Atrial fibrillation was noted this admission.   He has had a chronic pattern of atypical chest pain.  Cardiolite in 3/20 showed EF 54%, small fixed inferior defect with no ischemia.   He was admitted in 8/20 with severe episode of chest pain.  Hs-TnI was mildly elevated without trend.  Cath was done, showing no target for PCI and stable disease. Echo was done showing EF 50-55% with moderate LVH and normal RV.    He presents today for followup of CHF and CAD.  He has not been seen in several months. Last OV was 5/21. Here w/ interpreter today. Multiple complaints.   1) intermittent atypical chest pain, frequent occurring several times a day. Sharp in nature, can occur at rest and with exertion. Self limiting. EKG today shows sinus brady 58 bpm w/ lateral TW depressions (no significant change from  prior EKG)  2) residual LEE post HD. Feels that volume removal at HD is not adequate. He also experiences occasional exertional dyspnea. No LEE on my exam today but ReDs clip mildly elevated at 36% (20-35% normal)  3) Bilateral LEE pain/ cramps, occurs at rest and w/ exertion.   4) Slow healing LE wound on 5th digit of rt foot. He is diabetic. He has also notified PCP. He thinks it is getting gradually better and not worse   ECG (personally reviewed): sinus brady, RBBB 58 bpm, lateral TWIs (old)   Labs (12/18): LDL 64, HDL 31 Labs (7/19): LDL 36  Labs (2/20): hgb 11.3, LDL 59, HDL 28 Labs (5/21): LDL 29. TG 114 BMP and CBC followed by nephrology at HD   PMH: 1. Type II diabetes 2. HTN 3. ESRD 4. CAD: NSTEMI 9/17 with CABG x 3 (LIMA-LAD, SVG-OM, SVG-RCA).   - NSTEMI 10/18 s/p DES to ostial SVG-OM - NSTEMI 7/19: severe in-stent restenosis in SVG-ramus, chronic total occlusion of SVG-RCA.  DES x 2 to native ramus.  - Cardiolite (3/20): EF 54%, small fixed inferior defect with no ischemia.  - LHC (8/20): 90% pLAD, subtotal occlusion mLAD, LIMA-LAD patent, occluded SVG-ramus, 40-50% in-stent restenosis in native ramus, 80% ostial small AV LCx (not target for intervention), occluded RCA, occluded SVG-RCA (chronic with left=>right collaterals).  No target for intervention.  5. Chronic systolic CHF: Ischemic cardiomyopathy.   - Echo (  9/17) with EF 35-40%, grade II diastolic dysfunction.  - Echo (1/18) with EF 60-65%, grade II diastolic dysfunction. - Echo (10/18) with EF 55%, Akinesis of the basal-midinferolateral, inferior, and inferoseptal myocardiam. Akinesis of the basal anteroseptal myocardium.  - Echo (7/19): EF 50%, moderate LVH, mildly decreased RV systolic function.  - Echo (8/20): EF 50-55%, moderate LVH, normal RV 6. Atrial fibrillation: Paroxysmal.  Only noted post-op CABG.  7. ABIs normal in 12/17. 5/18 ABIs normal.  1/19 ABIs normal. 4/20 ABIs normal.  8. Carotid stenosis: Carotid  dopplers (12/18) with < 50% right common carotid stenosis, 123456 LICA stenosis.  - Carotid dopplers (1/20): < 50% BICA stenosis.  9. Atrial fibrillation: Paroxysmal.  10. 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 Sciatica  SH: Married, lives in [place], 1 son, nonsmoker, no ETOH, speaks only [place].   FH: No premature CAD.  Review of systems complete and found to be negative unless listed in HPI.    Current Outpatient Medications  Medication Sig Dispense Refill  . acetaminophen (TYLENOL) 500 MG tablet Take 1,000 mg by mouth every 6 (six) hours as needed for mild pain or headache.     Marland Kitchen amLODipine (NORVASC) 10 MG tablet Take 10 mg by mouth at bedtime.     . Ascorbic Acid (VITAMIN C) 1000 MG tablet Take 1,000 mg by mouth 3 (three) times daily.     Marland Kitchen atorvastatin (LIPITOR) 80 MG tablet Take 1 tablet by mouth once daily 90 tablet 3  . calcium acetate (PHOSLO) 667 MG capsule Take 667 mg by mouth 3 (three) times daily with meals.     . citalopram (CELEXA) 20 MG tablet Take 20 mg by mouth daily with lunch.   1  . ELIQUIS 5 MG TABS tablet Take 1 tablet by mouth twice daily 180 tablet 3  . ezetimibe (ZETIA) 10 MG tablet Take 1 tablet by mouth once daily 90 tablet 3  . gabapentin (NEURONTIN) 100 MG capsule Take 100 mg by mouth daily with lunch.   0  . hydrALAZINE (APRESOLINE) 25 MG tablet TAKE 1 TABLET BY MOUTH EVERY 8 HOURS 270 tablet 3  . Insulin Human (INSULIN PUMP) SOLN Inject into the skin continuous. NOVOLOG    . isosorbide mononitrate (IMDUR) 60 MG 24 hr tablet Take 1 tablet by mouth once daily 90 tablet 3  . losartan (COZAAR) 100 MG tablet Take 100 mg by mouth at bedtime.     . metoprolol succinate (TOPROL-XL) 25 MG 24 hr tablet TAKE 1 TABLET BY MOUTH AT BEDTIME 90 tablet 0  . NOVOLOG 100 UNIT/ML injection Inject 6-10 Units into the skin See admin instructions. Starts with 10 units per day and adds 6 units with each meal via pump    . oxyCODONE-acetaminophen  (PERCOCET) 7.5-325 MG tablet Take 1 tablet by mouth every 4 (four) hours as needed for severe pain.    . pantoprazole (PROTONIX) 40 MG tablet Take 40 mg by mouth daily before lunch.   0   No current facility-administered medications for this encounter.   Vitals:   10/31/20 1332  BP: 138/89  Pulse: 60  SpO2: 98%  Weight: 62.5 kg (137 lb 12.8 oz)   Wt Readings from Last 3 Encounters:  10/31/20 62.5 kg (137 lb 12.8 oz)  01/23/20 62.4 kg (137 lb 9.6 oz)  12/12/19 65.5 kg (144 lb 6.4 oz)   PHYSICAL EXAM: ReDs Clip 36%  General:  Well appearing, thin male. No respiratory difficulty HEENT: normal  Neck: supple. no JVD. Carotids 2+ bilat; no bruits. No lymphadenopathy or thyromegaly appreciated. Cor: PMI nondisplaced. Regular rate & rhythm. No rubs, gallops or murmurs. Lungs: clear Abdomen: soft, nontender, nondistended. No hepatosplenomegaly. No bruits or masses. Good bowel sounds. Extremities: no cyanosis, clubbing, rash, edema, superficial wound/ shallow ulcer on 5th digit of rt foot, 2+ DPs  Neuro: alert & oriented x 3, cranial nerves grossly intact. moves all 4 extremities w/o difficulty. Affect pleasant.    Assessment/Plan: 1. CAD: s/p CABG.  NSTEMI 10/18, cath with 95% ostial SVG-ramus stenosis with TIMI 2 flow in graft, s/p DES to SVG-ramus 07/07/17. NSTEMI 7/19 with in-stent restenosis in the SVG-ramus, had DES x 2 to native ramus.   He has had chronic atypical chest pain episodes, Cardiolite in 3/20 showed no ischemia.  He had repeat cath in 8/20 showing occluded SVG-ramus (new) but continued patency of the native ramus s/p stenting.  No target for intervention.   - He complains of frequent atypical CP. EKG shows sinus brady w RBBB and lateral TWIs, unchanged from previous. Symptoms are atypical but he is diabetic. Given his significant history of CAD, PVD and DM, will arrange for Lexiscan NST to r/o LIMA territory ischemia and update echo  - No ASA due to Eliquis for Afib  -  Continue Imdur 60 mg daily.  - Continue atorvastatin 80 mg daily.  2. Hyperlipidemia: LDL goal < 70 mg/dL  - Continue atorvastatin 80 + and Zetia 10 mg daily .  - Check lipids today. If LDL not at goal, will send to lipid clinic for [id number]  3. Chronic diastolic CHF:  Ischemic cardiomyopathy.  EF low in past but improved after CABG. Most recent echo 8/20 with EF 50-55%.     - On dialysis for volume management.  He complains of frequent residual LEE post HD. Feels that volume removal at HD is not adequate. He also experiences occasional exertional dyspnea. No LEE on my exam today but ReDs clip mildly elevated at 36% (20-35% normal). Will route to nephrology to see if they can try to pull more fluid off w/ HD.  - Continue Toprol XL 25 mg daily as well as losartan 100 mg daily. - Nephrology follows labs   4. Atrial fibrillation: Paroxysmal - in NSR on EKG today  - Denies abnormal bleeding. Continue Eliquis.   5. HTN: Controlled on current regimen. No change  6. ESRD: HD MWF.     7. Carotid stenosis:  - followed yearly. Last study 5/21 showed 1-39% bilateral ICA stenosis + rt sided subclavian stenosis  - repeat study  - continue statin  8. Bilateral LEE pain ? Claudication  - he has palpable DPs on exam but slow healing wound on 5th digit of rt foot, known extensive vascular disease (CAD and carotid/ subclavian stenosis) as well and DM - Check LE arterial dopplers w/ ABIs. If abnormal, will refer to Dr. Gwenlyn Found   F/u w/ Dr. Aundra Dubin in 3 months   Lyda Jester, PA-C 10/31/2020

## 2020-10-31 NOTE — Progress Notes (Signed)
ReDS Vest / Clip - 10/31/20 1300      ReDS Vest / Clip   Station Marker A    Ruler Value 33    ReDS Value Range Moderate volume overload    ReDS Actual Value 36

## 2020-10-31 NOTE — Patient Instructions (Signed)
It was great to see you today! No medication changes are needed at this time.   Labs today We will only contact you if something comes back abnormal or we need to make some changes. Otherwise no news is good news!  Your physician has requested that you have an echocardiogram. Echocardiography is a painless test that uses sound waves to create images of your heart. It provides your doctor with information about the size and shape of your heart and how well your heart's chambers and valves are working. This procedure takes approximately one hour. There are no restrictions for this procedure.  Your physician has requested that you have a carotid duplex. This test is an ultrasound of the carotid arteries in your neck. It looks at blood flow through these arteries that supply the brain with blood. Allow one hour for this exam. There are no restrictions or special instructions.  Your physician has requested that you have a lower or upper extremity arterial duplex. This test is an ultrasound of the arteries in the legs or arms. It looks at arterial blood flow in the legs and arms. Allow one hour for Lower and Upper Arterial scans. There are no restrictions or special instructions  Your physician recommends that you schedule a follow-up appointment in: 1 month  in the [facility] and in 3 months with Dr Aundra Dubin  If you have any questions or concerns before your next appointment please send [place] a message through [facility] or call our office at [phone].    TO LEAVE A MESSAGE FOR THE NURSE SELECT OPTION 2, PLEASE LEAVE A MESSAGE INCLUDING: . YOUR NAME . DATE OF BIRTH . CALL BACK NUMBER . REASON FOR CALL**this is important as we prioritize the call backs  YOU WILL RECEIVE A CALL BACK THE SAME DAY AS LONG AS YOU CALL BEFORE 4:00 PM

## 2020-11-06 ENCOUNTER — Emergency Department (HOSPITAL_COMMUNITY)
Admission: EM | Admit: 2020-11-06 | Discharge: 2020-11-06 | Disposition: A | Discharge status: Home / Self Care | Payer: Medicare Other | Attending: Emergency Medicine | Admitting: Emergency Medicine

## 2020-11-06 ENCOUNTER — Encounter (HOSPITAL_COMMUNITY): Payer: Self-pay | Admitting: Emergency Medicine

## 2020-11-06 DIAGNOSIS — Z794 Long term (current) use of insulin: Secondary | ICD-10-CM | POA: Diagnosis not present

## 2020-11-06 DIAGNOSIS — Z992 Dependence on renal dialysis: Secondary | ICD-10-CM | POA: Diagnosis not present

## 2020-11-06 DIAGNOSIS — N186 End stage renal disease: Secondary | ICD-10-CM | POA: Insufficient documentation

## 2020-11-06 DIAGNOSIS — Z955 Presence of coronary angioplasty implant and graft: Secondary | ICD-10-CM | POA: Insufficient documentation

## 2020-11-06 DIAGNOSIS — I2510 Atherosclerotic heart disease of native coronary artery without angina pectoris: Secondary | ICD-10-CM | POA: Insufficient documentation

## 2020-11-06 DIAGNOSIS — Z7901 Long term (current) use of anticoagulants: Secondary | ICD-10-CM | POA: Insufficient documentation

## 2020-11-06 DIAGNOSIS — Z951 Presence of aortocoronary bypass graft: Secondary | ICD-10-CM | POA: Diagnosis not present

## 2020-11-06 DIAGNOSIS — I480 Paroxysmal atrial fibrillation: Secondary | ICD-10-CM | POA: Diagnosis not present

## 2020-11-06 DIAGNOSIS — E1122 Type 2 diabetes mellitus with diabetic chronic kidney disease: Secondary | ICD-10-CM | POA: Diagnosis not present

## 2020-11-06 DIAGNOSIS — I5032 Chronic diastolic (congestive) heart failure: Secondary | ICD-10-CM | POA: Diagnosis not present

## 2020-11-06 DIAGNOSIS — T82590A Other mechanical complication of surgically created arteriovenous fistula, initial encounter: Secondary | ICD-10-CM | POA: Insufficient documentation

## 2020-11-06 DIAGNOSIS — Z79899 Other long term (current) drug therapy: Secondary | ICD-10-CM | POA: Diagnosis not present

## 2020-11-06 DIAGNOSIS — T85590A Other mechanical complication of bile duct prosthesis, initial encounter: Secondary | ICD-10-CM | POA: Diagnosis present

## 2020-11-06 DIAGNOSIS — T829XXA Unspecified complication of cardiac and vascular prosthetic device, implant and graft, initial encounter: Secondary | ICD-10-CM

## 2020-11-06 DIAGNOSIS — Z87891 Personal history of nicotine dependence: Secondary | ICD-10-CM | POA: Insufficient documentation

## 2020-11-06 DIAGNOSIS — I132 Hypertensive heart and chronic kidney disease with heart failure and with stage 5 chronic kidney disease, or end stage renal disease: Secondary | ICD-10-CM | POA: Insufficient documentation

## 2020-11-06 MED ORDER — HYDRALAZINE HCL 25 MG PO TABS
25.0000 mg | ORAL_TABLET | Freq: Once | ORAL | Status: AC
  Administered 2020-11-06: 25 mg via ORAL
  Filled 2020-11-06: qty 1

## 2020-11-06 MED ORDER — AMLODIPINE BESYLATE 5 MG PO TABS
10.0000 mg | ORAL_TABLET | Freq: Once | ORAL | Status: AC
  Administered 2020-11-06: 10 mg via ORAL
  Filled 2020-11-06: qty 2

## 2020-11-06 NOTE — Discharge Instructions (Signed)
Please schedule an appointment with your primary doctor to discuss your blood pressure regimen.  Please continue your regular home medications as previously prescribed.  Please continue your regular dialysis sessions as advised by your nephrologist.

## 2020-11-06 NOTE — ED Provider Notes (Signed)
[facility] EMERGENCY DEPARTMENT Provider Note   CSN: [id number] Arrival date & time: 11/06/20  1319     History No chief complaint on file.   Kenneth [name] is a 65 y.o. male.  Presents to ER with concern for bleeding from his dialysis site.    Per report, patient "Dialysis tech clamped site for two hours to stop bleeding and when unclamped bleeding returned so she called EMS. Patient alert, oriented, and in no apparent distress at this time. Patient arrives with clamp in place from dialysis, no active bleeding noted."  Patient reports that he has had prior episodes similar to those.  States he was in his normal state of health when he went to the dialysis center today.  Unaware of any complications with dialysis itself.  Has no complaints at this time outside the area of concern for bleeding.  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  HPI     Past Medical History:  Diagnosis Date  . Acute pulmonary edema ([place]) 05/31/2016  . Acute respiratory failure with hypoxemia ([place]) 05/31/2016  . CAD (coronary artery disease) 10/30/2016   NSTEMI 9/17 with CABG x 3 (LIMA-LAD, SVG-OM, SVG-RCA).   - NSTEMI 10/18 s/p DES to ostial SVG to  OM  . Depression   . Diabetic foot ulcer ([place]) 04/11/2017  . 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 ESRD (end stage renal disease) on dialysis [facility])    "MWF; [place]" (03/30/2018)  . Gastroesophageal reflux disease 08/18/2016  . HCAP (healthcare-associated pneumonia)   . Hemodialysis status ([place])   . Hyperlipidemia   . Hypertension   . Hypertensive heart disease 09/12/2017  . Ischemic rest pain of lower extremity 02/06/2015  . 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  NSTEMI (non-ST elevated myocardial infarction) ([place])   . PAF (paroxysmal atrial fibrillation) ([place])   . Pleural effusion   . 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 Acute metabolic encephalopathy 123456  . Altered mental status 08/27/2019  . Unresponsive episode 08/27/2019  . AF (paroxysmal atrial fibrillation) ([place]) 08/27/2019  . Status post coronary artery stent placement   . NSTEMI (non-ST elevated myocardial infarction) ([place]) 03/30/2018  . Hypertensive heart disease 09/12/2017  . Hyperlipidemia 09/12/2017  . Hyperkalemia 07/20/2017  . Chronic diastolic CHF (congestive heart failure) ([place]) 04/14/2017  . Hypertensive emergency 04/14/2017  . Anemia, chronic renal failure 04/11/2017  . Diabetic foot ulcer ([place]) 04/11/2017  . Cellulitis in diabetic foot ([place]) 01/18/2017  . Chest pain 12/16/2016  . Pressure injury of skin 11/03/2016  . Diabetes mellitus with complication ([place]) 123456  . ESRD (end stage renal disease) ([place]) 10/30/2016  . CAD (coronary artery disease) 10/30/2016  . Gastroesophageal reflux disease 08/18/2016  . Non-ST elevation (NSTEMI) myocardial infarction ([place])   . 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[place]) 02/06/2015  . Ischemic rest pain of lower extremity ([place]) 02/06/2015    Past Surgical History:  Procedure Laterality Date  . AV FISTULA PLACEMENT Left 06/22/2016   Procedure: ARTERIOVENOUS (AV) FISTULA CREATION LEFT UPPER ARM;  Surgeon: Rosetta Posner, MD;  Location: [facility];  Service: Vascular;  Laterality: Left;  .  CARDIAC CATHETERIZATION N/A 05/31/2016   Procedure: Left Heart Cath and Coronary Angiography;  Surgeon: Jettie Booze, MD;  Location: [facility] CV LAB;  Service: Cardiovascular;  Laterality: N/A;  . CARDIAC CATHETERIZATION N/A 05/31/2016   Procedure: Right Heart Cath;  Surgeon: Jettie Booze, MD;   Location: [facility] CV LAB;  Service: Cardiovascular;  Laterality: N/A;  . CARDIAC CATHETERIZATION N/A 05/31/2016   Procedure: IABP Insertion;  Surgeon: Jettie Booze, MD;  Location: [facility] CV LAB;  Service: Cardiovascular;  Laterality: N/A;  . CORONARY ARTERY BYPASS GRAFT N/A 06/05/2016   Procedure: CORONARY ARTERY BYPASS GRAFTING (CABG) x3 LIMA to LAD -SVG to OM -SVG to RCA;  Surgeon: Ivin Poot, MD;  Location: [facility];  Service: Open Heart Surgery;  Laterality: N/A;  . CORONARY STENT INTERVENTION N/A 07/07/2017   Procedure: CORONARY STENT INTERVENTION;  Surgeon: Wellington Hampshire, MD;  Location: [facility] CV LAB;  Service: Cardiovascular;  Laterality: N/A;  . CORONARY STENT INTERVENTION N/A 03/30/2018   Procedure: CORONARY STENT INTERVENTION;  Surgeon: Martinique, Peter M, MD;  Location: [facility] CV LAB;  Service: Cardiovascular;  Laterality: N/A;  . INSERTION OF DIALYSIS CATHETER N/A 06/05/2016   Procedure: INSERTION OF DIALYSIS/trialysis CATHETER;  Surgeon: Ivin Poot, MD;  Location: [facility];  Service: Vascular;  Laterality: N/A;  . INSERTION OF DIALYSIS CATHETER Right 06/13/2016   Procedure: INSERTION OF DIALYSIS CATHETER RIGHT INTERNAL JUGULAR;  Surgeon: Conrad Baldwyn, MD;  Location: [facility];  Service: Vascular;  Laterality: Right;  . INSERTION OF DIALYSIS CATHETER Right 09/07/2019   Procedure: Insertion Of Dialysis Catheter;  Surgeon: Rosetta Posner, MD;  Location: [facility] OR;  Service: Vascular;  Laterality: Right;  . INTRAOPERATIVE TRANSESOPHAGEAL ECHOCARDIOGRAM N/A 06/05/2016   Procedure: INTRAOPERATIVE TRANSESOPHAGEAL ECHOCARDIOGRAM;  Surgeon: Ivin Poot, MD;  Location: [facility];  Service: Open Heart Surgery;  Laterality: N/A;  . LEFT HEART CATH AND CORS/GRAFTS ANGIOGRAPHY N/A 11/03/2016   Procedure: Left Heart Cath and Cors/Grafts Angiography;  Surgeon: Troy Sine, MD;  Location: [place] CV LAB;  Service: Cardiovascular;  Laterality: N/A;  . LEFT HEART CATH AND  CORS/GRAFTS ANGIOGRAPHY N/A 07/07/2017   Procedure: LEFT HEART CATH AND CORS/GRAFTS ANGIOGRAPHY;  Surgeon: Larey Dresser, MD;  Location: [facility] CV LAB;  Service: Cardiovascular;  Laterality: N/A;  . LEFT HEART CATH AND CORS/GRAFTS ANGIOGRAPHY N/A 03/30/2018   Procedure: LEFT HEART CATH AND CORS/GRAFTS ANGIOGRAPHY;  Surgeon: Martinique, Peter M, MD;  Location: [place] CV LAB;  Service: Cardiovascular;  Laterality: N/A;  . LEFT HEART CATH AND CORS/GRAFTS ANGIOGRAPHY N/A 04/17/2019   Procedure: LEFT HEART CATH AND CORS/GRAFTS ANGIOGRAPHY;  Surgeon: Larey Dresser, MD;  Location: [facility] CV LAB;  Service: Cardiovascular;  Laterality: N/A;  . REVISION OF ARTERIOVENOUS GORETEX GRAFT Left [id number]   Procedure: PLICATION OF ANEURYSM OF ARTERIOVENOUS FISTULA  LEFT ARM;  Surgeon: Rosetta Posner, MD;  Location: [facility];  Service: Vascular;  Laterality: Left;  . REVISON OF ARTERIOVENOUS FISTULA Left 09/07/2019   Procedure: REVISON OF LEFT UPPER ARM ARTERIOVENOUS FISTULA;  Surgeon: Rosetta Posner, MD;  Location: [facility] OR;  Service: Vascular;  Laterality: Left;       Family History  Problem Relation Age of Onset  . CAD Father   . Colon cancer Father   . Diabetes Brother     Social History   Tobacco Use  . Smoking status: Former Smoker    Packs/day: 1.50    Years: 35.00  Pack years: 52.50    Types: Cigarettes    Quit date: 2014    Years since quitting: 8.1  . Smokeless tobacco: Never Used  . Tobacco comment: "quit e-cigs ~ 2014"  Vaping Use  . Vaping Use: Former  Substance Use Topics  . Alcohol use: Never    Alcohol/week: 0.0 standard drinks  . Drug use: Never    Home Medications Prior to Admission medications   Medication Sig Start Date End Date Taking? Authorizing Provider  acetaminophen (TYLENOL) 500 MG tablet Take 1,000 mg by mouth every 6 (six) hours as needed for mild pain or headache.     [provider]  amLODipine (NORVASC) 10 MG tablet Take 10 mg by mouth at  bedtime.     [provider]  Ascorbic Acid (VITAMIN C) 1000 MG tablet Take 1,000 mg by mouth 3 (three) times daily.     [provider]  atorvastatin (LIPITOR) 80 MG tablet Take 1 tablet by mouth once daily 12/22/19   Larey Dresser, MD  calcium acetate (PHOSLO) 667 MG capsule Take 667 mg by mouth 3 (three) times daily with meals.  07/09/17   [provider]  citalopram (CELEXA) 20 MG tablet Take 20 mg by mouth daily with lunch.  02/25/17   [provider]  ELIQUIS 5 MG TABS tablet Take 1 tablet by mouth twice daily 02/06/20   Larey Dresser, MD  ezetimibe (ZETIA) 10 MG tablet Take 1 tablet by mouth once daily 02/06/20   Larey Dresser, MD  gabapentin (NEURONTIN) 100 MG capsule Take 100 mg by mouth daily with lunch.  02/11/17   [provider]  hydrALAZINE (APRESOLINE) 25 MG tablet TAKE 1 TABLET BY MOUTH EVERY 8 HOURS 12/22/19   Larey Dresser, MD  Insulin Human (INSULIN PUMP) SOLN Inject into the skin continuous. NOVOLOG    [provider]  isosorbide mononitrate (IMDUR) 60 MG 24 hr tablet Take 1 tablet by mouth once daily 05/27/20   Larey Dresser, MD  losartan (COZAAR) 100 MG tablet Take 100 mg by mouth at bedtime.     [provider]  metoprolol succinate (TOPROL-XL) 25 MG 24 hr tablet TAKE 1 TABLET BY MOUTH AT BEDTIME 07/17/19   Larey Dresser, MD  NOVOLOG 100 UNIT/ML injection Inject 6-10 Units into the skin See admin instructions. Starts with 10 units per day and adds 6 units with each meal via pump 01/08/19   [provider]  oxyCODONE-acetaminophen (PERCOCET) 7.5-325 MG tablet Take 1 tablet by mouth every 4 (four) hours as needed for severe pain.    [provider]  pantoprazole (PROTONIX) 40 MG tablet Take 40 mg by mouth daily before lunch.  01/07/17   [provider]    Allergies    Patient has no known allergies.  Review of Systems   Review of Systems  Constitutional: Negative for chills and  fever.  HENT: Negative for ear pain and sore throat.   Eyes: Negative for pain and visual disturbance.  Respiratory: Negative for cough and shortness of breath.   Cardiovascular: Negative for chest pain and palpitations.  Gastrointestinal: Negative for abdominal pain and vomiting.  Genitourinary: Negative for dysuria and hematuria.  Musculoskeletal: Negative for arthralgias and back pain.  Skin: Negative for color change and rash.       Bleeding  Neurological: Negative for seizures and syncope.  All other systems reviewed and are negative.   Physical Exam Updated Vital Signs BP (!) 172/76  Pulse 62   Temp 97.9 F (36.6 C) (Oral)   Resp (!) 22   SpO2 97%   Physical Exam Vitals and nursing note reviewed.  Constitutional:      Appearance: He is well-developed and well-nourished.  HENT:     Head: Normocephalic and atraumatic.  Eyes:     Conjunctiva/sclera: Conjunctivae normal.  Cardiovascular:     Rate and Rhythm: Normal rate.     Pulses: Normal pulses.     Heart sounds: No murmur heard.   Pulmonary:     Effort: Pulmonary effort is normal. No respiratory distress.  Musculoskeletal:        General: No edema.     Cervical back: Neck supple.     Comments: Left upper extremity: Fistula site appears well, no active bleeding was noted, good thrill  Skin:    General: Skin is warm and dry.     Comments: See above  Neurological:     General: No focal deficit present.     Mental Status: He is alert.  Psychiatric:        Mood and Affect: Mood and affect and mood normal.        Behavior: Behavior normal.     ED Results / Procedures / Treatments   Labs (all labs ordered are listed, but only abnormal results are displayed) Labs Reviewed - No data to display  EKG None  Radiology No results found.  Procedures Procedures   Medications Ordered in ED Medications  amLODipine (NORVASC) tablet 10 mg (10 mg Oral Given 11/06/20 1532)  hydrALAZINE (APRESOLINE) tablet 25 mg  (25 mg Oral Given 11/06/20 1531)    ED Course  I have reviewed the triage vital signs and the nursing notes.  Pertinent labs & imaging results that were available during my care of the patient were reviewed by me and considered in my medical decision making (see chart for details).    MDM Rules/Calculators/A&P                          65 year old male with history of ESRD on dialysis presenting to ER with concern for bleeding from his left upper arm AV fistula.  When I evaluated patient, removed the clamp that had been placed by dialysis staff.  No active bleeding was seen.  Good thrill present.  Noted to be hypertensive, wife states patient had missed a couple doses of his blood pressure medicines.  Patient was observed in ER, no recurrence of bleeding.  Patient appears well with no ongoing symptoms.  Discharged home.   After the discussed management above, the patient was determined to be safe for discharge.  The patient was in agreement with this plan and all questions regarding their care were answered.  ED return precautions were discussed and the patient will return to the ED with any significant worsening of condition.    Final Clinical Impression(s) / ED Diagnoses Final diagnoses:  Complication of arteriovenous dialysis fistula, initial encounter    Rx / DC Orders ED Discharge Orders    None       Lucrezia Starch, MD 11/06/20 [phone]

## 2020-11-06 NOTE — ED Notes (Signed)
Pt verbalizes understanding of discharge instructions. Opportunity for questions and answers were provided. Armband removed by staff, pt discharged from the ED.  

## 2020-11-06 NOTE — ED Triage Notes (Addendum)
Patient BIB [facility] from dialysis center for a bleeding graft site. Dialysis tech clamped site for two hours to stop bleeding and when unclamped bleeding returned so she called EMS. Patient alert, oriented, and in no apparent distress at this time. Patient arrives with clamp in place from dialysis, no active bleeding noted.

## 2020-11-07 ENCOUNTER — Other Ambulatory Visit (HOSPITAL_COMMUNITY): Payer: Self-pay | Admitting: Cardiology

## 2020-11-07 DIAGNOSIS — I739 Peripheral vascular disease, unspecified: Secondary | ICD-10-CM

## 2020-11-14 ENCOUNTER — Other Ambulatory Visit: Payer: Self-pay

## 2020-11-14 ENCOUNTER — Ambulatory Visit (HOSPITAL_COMMUNITY)
Admission: RE | Admit: 2020-11-14 | Discharge: 2020-11-14 | Disposition: A | Discharge status: Home / Self Care | Payer: Medicare Other | Source: Ambulatory Visit | Attending: Cardiovascular Disease | Admitting: Cardiovascular Disease

## 2020-11-14 DIAGNOSIS — I739 Peripheral vascular disease, unspecified: Secondary | ICD-10-CM | POA: Diagnosis present

## 2020-11-28 ENCOUNTER — Encounter (HOSPITAL_COMMUNITY): Payer: Self-pay

## 2020-11-28 ENCOUNTER — Ambulatory Visit (HOSPITAL_BASED_OUTPATIENT_CLINIC_OR_DEPARTMENT_OTHER)
Admission: RE | Admit: 2020-11-28 | Discharge: 2020-11-28 | Disposition: A | Discharge status: Home / Self Care | Payer: Medicare Other | Source: Ambulatory Visit | Attending: Cardiology | Admitting: Cardiology

## 2020-11-28 ENCOUNTER — Ambulatory Visit (HOSPITAL_COMMUNITY)
Admission: RE | Admit: 2020-11-28 | Discharge: 2020-11-28 | Disposition: A | Discharge status: Home / Self Care | Payer: Medicare Other | Source: Ambulatory Visit | Attending: Cardiology | Admitting: Cardiology

## 2020-11-28 ENCOUNTER — Other Ambulatory Visit: Payer: Self-pay

## 2020-11-28 VITALS — BP 140/68 | HR 60 | Wt 133.0 lb

## 2020-11-28 DIAGNOSIS — I351 Nonrheumatic aortic (valve) insufficiency: Secondary | ICD-10-CM | POA: Diagnosis not present

## 2020-11-28 DIAGNOSIS — N186 End stage renal disease: Secondary | ICD-10-CM | POA: Insufficient documentation

## 2020-11-28 DIAGNOSIS — Z87891 Personal history of nicotine dependence: Secondary | ICD-10-CM | POA: Insufficient documentation

## 2020-11-28 DIAGNOSIS — I5022 Chronic systolic (congestive) heart failure: Secondary | ICD-10-CM

## 2020-11-28 DIAGNOSIS — I2510 Atherosclerotic heart disease of native coronary artery without angina pectoris: Secondary | ICD-10-CM | POA: Insufficient documentation

## 2020-11-28 DIAGNOSIS — E785 Hyperlipidemia, unspecified: Secondary | ICD-10-CM | POA: Insufficient documentation

## 2020-11-28 DIAGNOSIS — I5032 Chronic diastolic (congestive) heart failure: Secondary | ICD-10-CM

## 2020-11-28 DIAGNOSIS — E1122 Type 2 diabetes mellitus with diabetic chronic kidney disease: Secondary | ICD-10-CM | POA: Diagnosis not present

## 2020-11-28 DIAGNOSIS — I132 Hypertensive heart and chronic kidney disease with heart failure and with stage 5 chronic kidney disease, or end stage renal disease: Secondary | ICD-10-CM | POA: Diagnosis not present

## 2020-11-28 DIAGNOSIS — I25810 Atherosclerosis of coronary artery bypass graft(s) without angina pectoris: Secondary | ICD-10-CM | POA: Diagnosis not present

## 2020-11-28 LAB — ECHOCARDIOGRAM COMPLETE
Area-P 1/2: 4.06 cm2
Calc EF: 57.3 %
S' Lateral: 3.3 cm
Single Plane A2C EF: 54.1 %
Single Plane A4C EF: 59.9 %

## 2020-11-28 NOTE — Progress Notes (Signed)
    ReDS Vest / Clip - 11/28/20 1100      ReDS Vest / Clip   Station Marker A    Ruler Value 29    ReDS Value Range Moderate volume overload    ReDS Actual Value 31

## 2020-11-28 NOTE — Patient Instructions (Addendum)
It was great to see you today! No medication changes are needed at this time.  Keep follow up as scheduled  Do the following things EVERYDAY: 1) Weigh yourself in the morning before breakfast. Write it down and keep it in a log. 2) Take your medicines as prescribed 3) Eat low salt foods--Limit salt (sodium) to 2000 mg per day.  4) Stay as active as you can everyday 5) Limit all fluids for the day to less than 2 liters  At the [facility], you and your health needs are our priority. As part of our continuing mission to provide you with exceptional heart care, we have created designated Provider Care Teams. These Care Teams include your primary Cardiologist (physician) and Advanced Practice Providers (APPs- Physician Assistants and Nurse Practitioners) who all work together to provide you with the care you need, when you need it.   You may see any of the following providers on your designated Care Team at your next follow up: Marland Kitchen Dr Glori Bickers . Dr Loralie Champagne . Dr Vickki Muff . Darrick Grinder, NP . Lyda Jester, [place] . Audry Riles, PharmD   Please be sure to bring in all your medications bottles to every appointment.    If you have any questions or concerns before your next appointment please send [place] a message through [place] or call our office at [phone].    TO LEAVE A MESSAGE FOR THE NURSE SELECT OPTION 2, PLEASE LEAVE A MESSAGE INCLUDING: . YOUR NAME . DATE OF BIRTH . CALL BACK NUMBER . REASON FOR CALL**this is important as we prioritize the call backs  YOU WILL RECEIVE A CALL BACK THE SAME DAY AS LONG AS YOU CALL BEFORE 4:00 PM  Please see our updated No Show and Same Day Appointment Cancellation Policy attached to your AVS.

## 2020-11-28 NOTE — Progress Notes (Signed)
[facility] Progress Note   PCP: Dr. Tasia Catchings [facility]) Cardiology: Dr. Aundra Dubin  65 y.o. 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  He was admitted in 9/17 with NSTEMI.  LHC showed 3VD and he had CABG x 3.  He had CKD stage IV when he was admitted and developed ESRD requiring HD while in the hospital.  He had transient atrial fibrillation post-CABG.  He developed cardiogenic shock requiring milrinone but was able to taper off.  Echo in 9/17 showed EF 35-40%.    Admitted 8/1 through 04/16/2017 with malignant hypertension due to medication noncompliance. Continues on HD.  HF M-W-F. Discharge weight 140 pounds.   Admitted 10/18 with chest pain and pulmonary edema. NSTEMI noted on admit. Echo showed preserved EF but + WMAs. Cath with 95% ostial SVG-OM1 stenosis with TIMI 2 flow in graft.  Patient had DES to SVG-OM1.   Admitted in 12/18 with atypical chest pain, suspect not cardiac.   NSTEMI in 7/19 with severe in-stent restenosis in SVG-OM1, had DES x 2 to native OM1.  Echo showed EF 50%.  Atrial fibrillation was noted this admission.   He has had a chronic pattern of atypical chest pain.  Cardiolite in 3/20 showed EF 54%, small fixed inferior defect with no ischemia.   He was admitted in 8/20 with severe episode of chest pain.  Hs-TnI was mildly elevated without trend.  Cath was done, showing no target for PCI and stable disease. Echo was done showing EF 50-55% with moderate LVH and normal RV.    I recently saw him for followup of CHF and CAD on 10/31/20.  He had not been seen in several months. He presented w/ multiple complaints  1) intermittent atypical chest pain, frequent occurring several times a day. Sharp in nature, can occur at rest and with exertion. Self limiting. EKG showed sinus brady 58 bpm w/ lateral TW depressions (no significant change from prior EKG)  2) residual  LEE post HD. Feels that volume removal at HD is not adequate. He also experiences occasional exertional dyspnea. No LEE on my exam  but ReDs clip mildly elevated at 36% (20-35% normal)  3) Bilateral LEE pain/ cramps, occurs at rest and w/ exertion.   4) Slow healing LE wound on 5th digit of rt foot. He is diabetic. He has also notified PCP. He thinks it is getting gradually better and not worse  I arranged for NST to r/o coronary ischemia, LE arterial dopplers to r/o PAD. I also contacted nephrology regarding adjusting HD to allow for more volume removal. Repeat echo was also ordered.   He presents back to clinic today with interpreter. Volume status improved. ReDs Clip 31%. BP 140/68 (usually 120s-130s w/ HD).   NST not completed. Echo completed today. Interpretation pending. LE arterial dopplers were normal. Rt ABI 1.04. Lt ABI 1.12.   His symptoms have improved w/ change in HD settings. He denies any further dyspnea. No further CP. Toe wound resolved.   ECG (personally reviewed): not performed   Labs (12/18): LDL 64, HDL 31 Labs (7/19): LDL 36  Labs (2/20): hgb 11.3, LDL 59, HDL 28 Labs (5/21): LDL 29. TG 114 BMP and CBC followed by nephrology at HD  Labs (2/22): LDL 52, ALT 29, AST 27, BMP followed at HD   PMH: 1. Type II diabetes 2. HTN 3. ESRD 4. CAD: NSTEMI 9/17 with  CABG x 3 (LIMA-LAD, SVG-OM, SVG-RCA).   - NSTEMI 10/18 s/p DES to ostial SVG-OM - NSTEMI 7/19: severe in-stent restenosis in SVG-ramus, chronic total occlusion of SVG-RCA.  DES x 2 to native ramus.  - Cardiolite (3/20): EF 54%, small fixed inferior defect with no ischemia.  - LHC (8/20): 90% pLAD, subtotal occlusion mLAD, LIMA-LAD patent, occluded SVG-ramus, 40-50% in-stent restenosis in native ramus, 80% ostial small AV LCx (not target for intervention), occluded RCA, occluded SVG-RCA (chronic with left=>right collaterals).  No target for intervention.  5. Chronic systolic CHF: Ischemic cardiomyopathy.   - Echo  (9/17) with EF 35-40%, grade II diastolic dysfunction.  - Echo (1/18) with EF 60-65%, grade II diastolic dysfunction. - Echo (10/18) with EF 55%, Akinesis of the basal-midinferolateral, inferior, and inferoseptal myocardiam. Akinesis of the basal anteroseptal myocardium.  - Echo (7/19): EF 50%, moderate LVH, mildly decreased RV systolic function.  - Echo (8/20): EF 50-55%, moderate LVH, normal RV 6. Atrial fibrillation: Paroxysmal.  Only noted post-op CABG.  7. ABIs normal in 12/17. 5/18 ABIs normal.  1/19 ABIs normal. 4/20 ABIs normal.  8. Carotid stenosis: Carotid dopplers (12/18) with < 50% right common carotid stenosis, 123456 LICA stenosis.  - Carotid dopplers (1/20): < 50% BICA stenosis.  9. Atrial fibrillation: Paroxysmal.  10. 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 Sciatica  SH: Married, lives in [place], 1 son, nonsmoker, no ETOH, speaks only [place].   FH: No premature CAD.  Review of systems complete and found to be negative unless listed in HPI.    Current Outpatient Medications  Medication Sig Dispense Refill  . acetaminophen (TYLENOL) 500 MG tablet Take 1,000 mg by mouth every 6 (six) hours as needed for mild pain or headache.     Marland Kitchen amLODipine (NORVASC) 10 MG tablet Take 10 mg by mouth at bedtime.     . Ascorbic Acid (VITAMIN C) 1000 MG tablet Take 1,000 mg by mouth 3 (three) times daily.     Marland Kitchen atorvastatin (LIPITOR) 80 MG tablet Take 1 tablet by mouth once daily 90 tablet 3  . calcium acetate (PHOSLO) 667 MG capsule Take 667 mg by mouth 3 (three) times daily with meals.     . citalopram (CELEXA) 20 MG tablet Take 20 mg by mouth daily with lunch.   1  . ELIQUIS 5 MG TABS tablet Take 1 tablet by mouth twice daily 180 tablet 3  . ezetimibe (ZETIA) 10 MG tablet Take 1 tablet by mouth once daily 90 tablet 3  . gabapentin (NEURONTIN) 100 MG capsule Take 100 mg by mouth daily with lunch.   0  . hydrALAZINE (APRESOLINE) 25 MG tablet TAKE 1 TABLET BY  MOUTH EVERY 8 HOURS 270 tablet 3  . Insulin Human (INSULIN PUMP) SOLN Inject into the skin continuous. NOVOLOG    . isosorbide mononitrate (IMDUR) 60 MG 24 hr tablet Take 1 tablet by mouth once daily 90 tablet 3  . losartan (COZAAR) 100 MG tablet Take 100 mg by mouth at bedtime.     . metoprolol succinate (TOPROL-XL) 25 MG 24 hr tablet TAKE 1 TABLET BY MOUTH AT BEDTIME 90 tablet 0  . NOVOLOG 100 UNIT/ML injection Inject 6-10 Units into the skin See admin instructions. Starts with 10 units per day and adds 6 units with each meal via pump    . oxyCODONE-acetaminophen (PERCOCET) 7.5-325 MG tablet Take 1 tablet by mouth every 4 (four) hours as needed for severe pain.    Marland Kitchen  pantoprazole (PROTONIX) 40 MG tablet Take 40 mg by mouth daily before lunch.   0   No current facility-administered medications for this encounter.   Vitals:   11/28/20 1102  BP: 140/68  Pulse: 60  SpO2: 97%  Weight: 60.3 kg (133 lb)   Wt Readings from Last 3 Encounters:  11/28/20 60.3 kg (133 lb)  10/31/20 62.5 kg (137 lb 12.8 oz)  01/23/20 62.4 kg (137 lb 9.6 oz)   PHYSICAL EXAM: ReDs Clip 31%   General:  Well appearing. No respiratory difficulty HEENT: normal Neck: supple. no JVD. Carotids 2+ bilat; no bruits. No lymphadenopathy or thyromegaly appreciated. Cor: PMI nondisplaced. Regular rate & rhythm. No rubs, gallops or murmurs. Lungs: clear Abdomen: soft, nontender, nondistended. No hepatosplenomegaly. No bruits or masses. Good bowel sounds. Extremities: no cyanosis, clubbing, rash, edema Neuro: alert & oriented x 3, cranial nerves grossly intact. moves all 4 extremities w/o difficulty. Affect pleasant.    Assessment/Plan: 1. CAD: s/p CABG.  NSTEMI 10/18, cath with 95% ostial SVG-ramus stenosis with TIMI 2 flow in graft, s/p DES to SVG-ramus 07/07/17. NSTEMI 7/19 with in-stent restenosis in the SVG-ramus, had DES x 2 to native ramus.   He has had chronic atypical chest pain episodes, Cardiolite in 3/20 showed  no ischemia.  He had repeat cath in 8/20 showing occluded SVG-ramus (new) but continued patency of the native ramus s/p stenting.  No target for intervention.   - no further CP. Suspect his recent episode was secondary to volume overload, now improved w/ change in HD fluid removal. He can complete 4 METs of physical activity w/o exertional CP  - no plans for repeat ischemic testing at this time - No ASA due to Eliquis for Afib  - Continue Imdur 60 mg daily.  - Continue atorvastatin 80 mg daily.  2. Hyperlipidemia: LDL goal < 70 mg/dL  - recent lipid panel 2/22 w/ LDL at 52. HFTs WNL  - Continue atorvastatin 80 + and Zetia 10 mg daily .  3. Chronic diastolic CHF:  Ischemic cardiomyopathy.  EF low in past but improved after CABG. Most recent echo 8/20 with EF 50-55%.     - On dialysis for volume management. Euvolemic on exam and by ReDS clip, 31% today  - Continue Toprol XL 25 mg daily as well as losartan 100 mg daily. - Nephrology follows labs   4. Atrial fibrillation: Paroxysmal - RRR on exam today  - Denies abnormal bleeding. Continue Eliquis.   5. HTN: Controlled on current regimen. No change  6. ESRD: HD MWF.     7. Carotid stenosis:  - followed yearly. Last study 5/21 showed 1-39% bilateral ICA stenosis + rt sided subclavian stenosis  - repeat study  - continue statin  8. Bilateral LEE pain  - completed recent w/u to exclude PAD. LE arterial dopplers 2/22 normal. Rt ABI 1.04, Lt ABI 1.12   Keep f/u w/ Dr. Aundra Dubin 5/22   Lyda Jester, PA-C 11/28/2020

## 2020-11-28 NOTE — Progress Notes (Signed)
  Echocardiogram 2D Echocardiogram has been performed.  Kenneth [name] 11/28/2020, 10:32 AM

## 2020-12-13 ENCOUNTER — Other Ambulatory Visit (HOSPITAL_COMMUNITY): Payer: Self-pay | Admitting: Cardiology

## 2020-12-15 ENCOUNTER — Emergency Department (HOSPITAL_COMMUNITY): Payer: Medicare Other

## 2020-12-15 ENCOUNTER — Inpatient Hospital Stay (HOSPITAL_COMMUNITY)
Admission: EM | Admit: 2020-12-15 | Discharge: 2020-12-25 | DRG: 308 | Disposition: A | Discharge status: Home / Self Care | Payer: Medicare Other | Attending: Internal Medicine | Admitting: Internal Medicine

## 2020-12-15 DIAGNOSIS — N186 End stage renal disease: Secondary | ICD-10-CM | POA: Diagnosis not present

## 2020-12-15 DIAGNOSIS — E118 Type 2 diabetes mellitus with unspecified complications: Secondary | ICD-10-CM

## 2020-12-15 DIAGNOSIS — R0603 Acute respiratory distress: Secondary | ICD-10-CM

## 2020-12-15 DIAGNOSIS — N2581 Secondary hyperparathyroidism of renal origin: Secondary | ICD-10-CM | POA: Diagnosis present

## 2020-12-15 DIAGNOSIS — E875 Hyperkalemia: Secondary | ICD-10-CM | POA: Diagnosis present

## 2020-12-15 DIAGNOSIS — K921 Melena: Secondary | ICD-10-CM | POA: Diagnosis not present

## 2020-12-15 DIAGNOSIS — Z833 Family history of diabetes mellitus: Secondary | ICD-10-CM

## 2020-12-15 DIAGNOSIS — I5032 Chronic diastolic (congestive) heart failure: Secondary | ICD-10-CM | POA: Diagnosis not present

## 2020-12-15 DIAGNOSIS — E1022 Type 1 diabetes mellitus with diabetic chronic kidney disease: Secondary | ICD-10-CM | POA: Diagnosis present

## 2020-12-15 DIAGNOSIS — I152 Hypertension secondary to endocrine disorders: Secondary | ICD-10-CM | POA: Diagnosis not present

## 2020-12-15 DIAGNOSIS — I252 Old myocardial infarction: Secondary | ICD-10-CM | POA: Diagnosis not present

## 2020-12-15 DIAGNOSIS — Z951 Presence of aortocoronary bypass graft: Secondary | ICD-10-CM | POA: Diagnosis not present

## 2020-12-15 DIAGNOSIS — F32A Depression, unspecified: Secondary | ICD-10-CM | POA: Diagnosis present

## 2020-12-15 DIAGNOSIS — K264 Chronic or unspecified duodenal ulcer with hemorrhage: Secondary | ICD-10-CM | POA: Diagnosis present

## 2020-12-15 DIAGNOSIS — Z79899 Other long term (current) drug therapy: Secondary | ICD-10-CM

## 2020-12-15 DIAGNOSIS — I480 Paroxysmal atrial fibrillation: Secondary | ICD-10-CM | POA: Diagnosis present

## 2020-12-15 DIAGNOSIS — R791 Abnormal coagulation profile: Secondary | ICD-10-CM | POA: Diagnosis present

## 2020-12-15 DIAGNOSIS — I208 Other forms of angina pectoris: Secondary | ICD-10-CM | POA: Diagnosis not present

## 2020-12-15 DIAGNOSIS — I2510 Atherosclerotic heart disease of native coronary artery without angina pectoris: Secondary | ICD-10-CM | POA: Diagnosis present

## 2020-12-15 DIAGNOSIS — D62 Acute posthemorrhagic anemia: Secondary | ICD-10-CM | POA: Diagnosis present

## 2020-12-15 DIAGNOSIS — K644 Residual hemorrhoidal skin tags: Secondary | ICD-10-CM | POA: Diagnosis present

## 2020-12-15 DIAGNOSIS — R42 Dizziness and giddiness: Secondary | ICD-10-CM | POA: Diagnosis not present

## 2020-12-15 DIAGNOSIS — D500 Iron deficiency anemia secondary to blood loss (chronic): Secondary | ICD-10-CM | POA: Diagnosis not present

## 2020-12-15 DIAGNOSIS — G9341 Metabolic encephalopathy: Secondary | ICD-10-CM | POA: Diagnosis not present

## 2020-12-15 DIAGNOSIS — R195 Other fecal abnormalities: Secondary | ICD-10-CM | POA: Diagnosis not present

## 2020-12-15 DIAGNOSIS — R55 Syncope and collapse: Secondary | ICD-10-CM

## 2020-12-15 DIAGNOSIS — Z9641 Presence of insulin pump (external) (internal): Secondary | ICD-10-CM | POA: Diagnosis present

## 2020-12-15 DIAGNOSIS — I132 Hypertensive heart and chronic kidney disease with heart failure and with stage 5 chronic kidney disease, or end stage renal disease: Secondary | ICD-10-CM | POA: Diagnosis not present

## 2020-12-15 DIAGNOSIS — Z6820 Body mass index (BMI) 20.0-20.9, adult: Secondary | ICD-10-CM

## 2020-12-15 DIAGNOSIS — I951 Orthostatic hypotension: Secondary | ICD-10-CM | POA: Diagnosis present

## 2020-12-15 DIAGNOSIS — E1065 Type 1 diabetes mellitus with hyperglycemia: Secondary | ICD-10-CM | POA: Diagnosis not present

## 2020-12-15 DIAGNOSIS — K648 Other hemorrhoids: Secondary | ICD-10-CM | POA: Diagnosis present

## 2020-12-15 DIAGNOSIS — D696 Thrombocytopenia, unspecified: Secondary | ICD-10-CM | POA: Diagnosis present

## 2020-12-15 DIAGNOSIS — D649 Anemia, unspecified: Secondary | ICD-10-CM | POA: Diagnosis not present

## 2020-12-15 DIAGNOSIS — E1051 Type 1 diabetes mellitus with diabetic peripheral angiopathy without gangrene: Secondary | ICD-10-CM | POA: Diagnosis present

## 2020-12-15 DIAGNOSIS — Z800 Family history of malignant neoplasm of digestive organs: Secondary | ICD-10-CM

## 2020-12-15 DIAGNOSIS — E785 Hyperlipidemia, unspecified: Secondary | ICD-10-CM | POA: Diagnosis present

## 2020-12-15 DIAGNOSIS — D631 Anemia in chronic kidney disease: Secondary | ICD-10-CM | POA: Diagnosis present

## 2020-12-15 DIAGNOSIS — I248 Other forms of acute ischemic heart disease: Secondary | ICD-10-CM | POA: Diagnosis present

## 2020-12-15 DIAGNOSIS — I5042 Chronic combined systolic (congestive) and diastolic (congestive) heart failure: Secondary | ICD-10-CM | POA: Diagnosis present

## 2020-12-15 DIAGNOSIS — R001 Bradycardia, unspecified: Secondary | ICD-10-CM | POA: Diagnosis not present

## 2020-12-15 DIAGNOSIS — K922 Gastrointestinal hemorrhage, unspecified: Secondary | ICD-10-CM | POA: Diagnosis not present

## 2020-12-15 DIAGNOSIS — Z7901 Long term (current) use of anticoagulants: Secondary | ICD-10-CM

## 2020-12-15 DIAGNOSIS — Z87891 Personal history of nicotine dependence: Secondary | ICD-10-CM

## 2020-12-15 DIAGNOSIS — R079 Chest pain, unspecified: Secondary | ICD-10-CM | POA: Diagnosis present

## 2020-12-15 DIAGNOSIS — Z992 Dependence on renal dialysis: Secondary | ICD-10-CM

## 2020-12-15 DIAGNOSIS — Z794 Long term (current) use of insulin: Secondary | ICD-10-CM | POA: Diagnosis not present

## 2020-12-15 DIAGNOSIS — I25118 Atherosclerotic heart disease of native coronary artery with other forms of angina pectoris: Secondary | ICD-10-CM | POA: Diagnosis present

## 2020-12-15 DIAGNOSIS — K635 Polyp of colon: Secondary | ICD-10-CM | POA: Diagnosis present

## 2020-12-15 DIAGNOSIS — Z955 Presence of coronary angioplasty implant and graft: Secondary | ICD-10-CM

## 2020-12-15 DIAGNOSIS — I214 Non-ST elevation (NSTEMI) myocardial infarction: Secondary | ICD-10-CM | POA: Diagnosis not present

## 2020-12-15 DIAGNOSIS — E1169 Type 2 diabetes mellitus with other specified complication: Secondary | ICD-10-CM | POA: Diagnosis not present

## 2020-12-15 DIAGNOSIS — K3189 Other diseases of stomach and duodenum: Secondary | ICD-10-CM | POA: Diagnosis not present

## 2020-12-15 DIAGNOSIS — Z8249 Family history of ischemic heart disease and other diseases of the circulatory system: Secondary | ICD-10-CM

## 2020-12-15 DIAGNOSIS — Z20822 Contact with and (suspected) exposure to covid-19: Secondary | ICD-10-CM | POA: Diagnosis not present

## 2020-12-15 DIAGNOSIS — I6529 Occlusion and stenosis of unspecified carotid artery: Secondary | ICD-10-CM | POA: Diagnosis present

## 2020-12-15 DIAGNOSIS — E10649 Type 1 diabetes mellitus with hypoglycemia without coma: Secondary | ICD-10-CM | POA: Diagnosis not present

## 2020-12-15 DIAGNOSIS — K219 Gastro-esophageal reflux disease without esophagitis: Secondary | ICD-10-CM | POA: Diagnosis present

## 2020-12-15 DIAGNOSIS — E1159 Type 2 diabetes mellitus with other circulatory complications: Secondary | ICD-10-CM | POA: Diagnosis not present

## 2020-12-15 DIAGNOSIS — K2970 Gastritis, unspecified, without bleeding: Secondary | ICD-10-CM | POA: Diagnosis present

## 2020-12-15 LAB — I-STAT VENOUS BLOOD GAS, ED
Acid-Base Excess: 2 mmol/L (ref 0.0–2.0)
Bicarbonate: 28 mmol/L (ref 20.0–28.0)
Calcium, Ion: 0.98 mmol/L — ABNORMAL LOW (ref 1.15–1.40)
HCT: 20 % — ABNORMAL LOW (ref 39.0–52.0)
Hemoglobin: 6.8 g/dL — CL (ref 13.0–17.0)
O2 Saturation: 82 %
Potassium: 6.2 mmol/L — ABNORMAL HIGH (ref 3.5–5.1)
Sodium: 131 mmol/L — ABNORMAL LOW (ref 135–145)
TCO2: 29 mmol/L (ref 22–32)
pCO2, Ven: 49.1 mmHg (ref 44.0–60.0)
pH, Ven: 7.364 (ref 7.250–7.430)
pO2, Ven: 49 mmHg — ABNORMAL HIGH (ref 32.0–45.0)

## 2020-12-15 LAB — TROPONIN I (HIGH SENSITIVITY): Troponin I (High Sensitivity): 44 ng/L — ABNORMAL HIGH (ref <18)

## 2020-12-15 LAB — CBC WITH DIFFERENTIAL/PLATELET
Abs Immature Granulocytes: 0.01 10*3/uL (ref 0.00–0.07)
Basophils Absolute: 0 10*3/uL (ref 0.0–0.1)
Basophils Relative: 0 %
Eosinophils Absolute: 0.2 10*3/uL (ref 0.0–0.5)
Eosinophils Relative: 5 %
HCT: 21.9 % — ABNORMAL LOW (ref 39.0–52.0)
Hemoglobin: 7.1 g/dL — ABNORMAL LOW (ref 13.0–17.0)
Immature Granulocytes: 0 %
Lymphocytes Relative: 20 %
Lymphs Abs: 0.9 10*3/uL (ref 0.7–4.0)
MCH: 32.7 pg (ref 26.0–34.0)
MCHC: 32.4 g/dL (ref 30.0–36.0)
MCV: 100.9 fL — ABNORMAL HIGH (ref 80.0–100.0)
Monocytes Absolute: 0.5 10*3/uL (ref 0.1–1.0)
Monocytes Relative: 11 %
Neutro Abs: 2.8 10*3/uL (ref 1.7–7.7)
Neutrophils Relative %: 64 %
Platelets: 97 10*3/uL — ABNORMAL LOW (ref 150–400)
RBC: 2.17 MIL/uL — ABNORMAL LOW (ref 4.22–5.81)
RDW: 15.5 % (ref 11.5–15.5)
WBC: 4.5 10*3/uL (ref 4.0–10.5)
nRBC: 0 % (ref 0.0–0.2)

## 2020-12-15 LAB — COMPREHENSIVE METABOLIC PANEL
ALT: 17 U/L (ref 0–44)
AST: 18 U/L (ref 15–41)
Albumin: 3.3 g/dL — ABNORMAL LOW (ref 3.5–5.0)
Alkaline Phosphatase: 82 U/L (ref 38–126)
Anion gap: 12 (ref 5–15)
BUN: 67 mg/dL — ABNORMAL HIGH (ref 8–23)
CO2: 26 mmol/L (ref 22–32)
Calcium: 7.8 mg/dL — ABNORMAL LOW (ref 8.9–10.3)
Chloride: 94 mmol/L — ABNORMAL LOW (ref 98–111)
Creatinine, Ser: 9.4 mg/dL — ABNORMAL HIGH (ref 0.61–1.24)
GFR, Estimated: 6 mL/min — ABNORMAL LOW (ref >60)
Glucose, Bld: 345 mg/dL — ABNORMAL HIGH (ref 70–99)
Potassium: 6.2 mmol/L — ABNORMAL HIGH (ref 3.5–5.1)
Sodium: 132 mmol/L — ABNORMAL LOW (ref 135–145)
Total Bilirubin: 0.6 mg/dL (ref 0.3–1.2)
Total Protein: 5.7 g/dL — ABNORMAL LOW (ref 6.5–8.1)

## 2020-12-15 LAB — I-STAT CHEM 8, ED
BUN: 75 mg/dL — ABNORMAL HIGH (ref 8–23)
Calcium, Ion: 0.98 mmol/L — ABNORMAL LOW (ref 1.15–1.40)
Chloride: 96 mmol/L — ABNORMAL LOW (ref 98–111)
Creatinine, Ser: 9.7 mg/dL — ABNORMAL HIGH (ref 0.61–1.24)
Glucose, Bld: 329 mg/dL — ABNORMAL HIGH (ref 70–99)
HCT: 20 % — ABNORMAL LOW (ref 39.0–52.0)
Hemoglobin: 6.8 g/dL — CL (ref 13.0–17.0)
Potassium: 6.2 mmol/L — ABNORMAL HIGH (ref 3.5–5.1)
Sodium: 131 mmol/L — ABNORMAL LOW (ref 135–145)
TCO2: 27 mmol/L (ref 22–32)

## 2020-12-15 LAB — CBG MONITORING, ED: Glucose-Capillary: 154 mg/dL — ABNORMAL HIGH (ref 70–99)

## 2020-12-15 LAB — PREPARE RBC (CROSSMATCH)

## 2020-12-15 LAB — APTT: aPTT: 39 seconds — ABNORMAL HIGH (ref 24–36)

## 2020-12-15 LAB — RESP PANEL BY RT-PCR (FLU A&B, COVID) ARPGX2
Influenza A by PCR: NEGATIVE
Influenza B by PCR: NEGATIVE
SARS Coronavirus 2 by RT PCR: NEGATIVE

## 2020-12-15 LAB — PROTIME-INR
INR: 1.3 — ABNORMAL HIGH (ref 0.8–1.2)
Prothrombin Time: 15.3 seconds — ABNORMAL HIGH (ref 11.4–15.2)

## 2020-12-15 LAB — POC OCCULT BLOOD, ED: Fecal Occult Bld: POSITIVE — AB

## 2020-12-15 MED ORDER — CALCIUM GLUCONATE-NACL 1-0.675 GM/50ML-% IV SOLN
1.0000 g | Freq: Once | INTRAVENOUS | Status: AC
  Administered 2020-12-15: 1000 mg via INTRAVENOUS
  Filled 2020-12-15: qty 50

## 2020-12-15 MED ORDER — CINACALCET HCL 30 MG PO TABS
60.0000 mg | ORAL_TABLET | ORAL | Status: DC
  Administered 2020-12-16 – 2020-12-25 (×7): 60 mg via ORAL
  Filled 2020-12-15 (×9): qty 2

## 2020-12-15 MED ORDER — CHLORHEXIDINE GLUCONATE CLOTH 2 % EX PADS
6.0000 | MEDICATED_PAD | Freq: Every day | CUTANEOUS | Status: DC
  Administered 2020-12-16 – 2020-12-24 (×9): 6 via TOPICAL

## 2020-12-15 MED ORDER — SODIUM CHLORIDE 0.9% IV SOLUTION: Freq: Once | INTRAVENOUS | Status: AC

## 2020-12-15 MED ORDER — SODIUM ZIRCONIUM CYCLOSILICATE 10 G PO PACK
10.0000 g | PACK | Freq: Once | ORAL | Status: AC
  Administered 2020-12-15: 10 g via ORAL
  Filled 2020-12-15: qty 1

## 2020-12-15 MED ORDER — SODIUM CHLORIDE 0.9 % IV SOLN: 10.0000 mL/h | Freq: Once | INTRAVENOUS | Status: DC

## 2020-12-15 MED ORDER — INSULIN ASPART 100 UNIT/ML IV SOLN
5.0000 [IU] | Freq: Once | INTRAVENOUS | Status: AC
  Administered 2020-12-15: 5 [IU] via INTRAVENOUS

## 2020-12-15 MED ORDER — DARBEPOETIN ALFA 60 MCG/0.3ML IJ SOSY
60.0000 ug | PREFILLED_SYRINGE | INTRAMUSCULAR | Status: DC
  Filled 2020-12-15 (×2): qty 0.3

## 2020-12-15 MED ORDER — SODIUM CHLORIDE 0.9 % IV SOLN: 1.0000 g | Freq: Once | INTRAVENOUS | Status: DC

## 2020-12-15 MED ORDER — DEXTROSE 50 % IV SOLN
1.0000 | Freq: Once | INTRAVENOUS | Status: AC
  Administered 2020-12-15: 50 mL via INTRAVENOUS
  Filled 2020-12-15: qty 50

## 2020-12-15 MED ORDER — INSULIN ASPART 100 UNIT/ML ~~LOC~~ SOLN
5.0000 [IU] | Freq: Once | SUBCUTANEOUS | Status: AC
  Administered 2020-12-20: 5 [IU] via INTRAVENOUS

## 2020-12-15 MED ORDER — PANTOPRAZOLE SODIUM 40 MG IV SOLR
40.0000 mg | Freq: Two times a day (BID) | INTRAVENOUS | Status: DC
  Administered 2020-12-16 – 2020-12-22 (×13): 40 mg via INTRAVENOUS
  Filled 2020-12-15 (×14): qty 40

## 2020-12-15 NOTE — H&P (Signed)
Kenneth [name] [id number] DOB: [birth year] DOA: 12/15/2020     PCP: [name], MD   Outpatient Specialists:     Davina Poke, PA-C Patient arrived to ER on 12/15/20 at 1619 Referred by Attending Gareth Morgan, MD   Patient coming from: home Lives  With family    Chief Complaint:   Chief Complaint  Patient presents with  . Bradycardia    HPI: Kenneth [name] is a 65 y.o. male with medical history significant of HLD, HTN, DM1  A.fib, ESRD on HD, CAD sp CABG  Presented with   CP tightness and weakness, took 2 nitro's at home  On EMS arrival bradycardic 40's and poorly responsive, given atropine 1 mg HR up to 40-60's Has not been feeling well for the past 1 wk   Reports blood in stool for on/off No black stools  Last HD was on Friday as scheduled  no fever no n/V/D Infectious risk factors:  Reports   severe fatigue     Has   been vaccinated against COVID and boosted   Initial COVID TEST  NEGATIVE   Lab Results  Component Value Date   [place] 12/15/2020   [place] NEGATIVE 09/05/2019   [place] NEGATIVE 08/27/2019   [place] NEGATIVE 07/25/2019    Regarding pertinent Chronic problems:    Hyperlipidemia -  on statins Lipitor, zetia Lipid Panel     Component Value Date/Time   CHOL 99 10/31/2020 1417   TRIG 50 10/31/2020 1417   HDL 37 (L) 10/31/2020 1417   CHOLHDL 2.7 10/31/2020 1417   VLDL 10 10/31/2020 1417   [place] 52 10/31/2020 1417     HTN on Norvasc, Cozaar, Imdur, toprol   chronic CHF diastolic - last echo March 2022    CAD  - On Aspirin, statin,                 -  followed by cardiology             He was admitted in 9/17 with NSTEMI.  LHC showed 3VD and he had CABG x 3.    DM 2 -  Lab Results  Component Value Date   HGBA1C 6.8 (H) 08/27/2019   on insulin, pump  A. Fib -  - CHA2DS2 vas score  4        current  on anticoagulation with   Eliquis,           -  Rate control:  Currently controlled with   Toprol     ESRD on HD M,W,F  Lab Results  Component Value Date   CREATININE 9.70 (H) 12/15/2020   CREATININE 9.40 (H) 12/15/2020   CREATININE 7.31 (H) 12/12/2019    Chronic anemia - baseline hg Hemoglobin & Hematocrit  Recent Labs    12/15/20 1636 12/15/20 1707 12/15/20 1708  HGB 7.1* 6.8* 6.8*    While in ER: K 6.2  Was in junctional rhythm Patient potassium was also treated with Lokelma glucose and insulin was given Calcium and rhythm improved Hg 7.1 Nephrology was consulted plan to dialyze tonight Noted to have hemoglobin drop 2 units if Hemoccult positive stools GI was also was consulted will see in a.m.    ED Triage Vitals  Enc Vitals Group     BP 12/15/20 1630 (!) 154/65     Pulse Rate 12/15/20 1630 78     Resp 12/15/20 1630 16     Temp 12/15/20 1630 97.6 F (  36.4 C)     Temp Source 12/15/20 1630 Oral     SpO2 12/15/20 1620 96 %     Weight --      Height --      Head Circumference --      Peak Flow --      Pain Score 12/15/20 1632 3     Pain Loc --      Pain Edu? --      Excl. in [place]? --   TMAX(24)@     _________________________________________ Significant initial  Findings: Abnormal Labs Reviewed  CBC WITH DIFFERENTIAL/PLATELET - Abnormal; Notable for the following components:      Result Value   RBC 2.17 (*)    Hemoglobin 7.1 (*)    HCT 21.9 (*)    MCV 100.9 (*)    Platelets 97 (*)    All other components within normal limits  COMPREHENSIVE METABOLIC PANEL - Abnormal; Notable for the following components:   Sodium 132 (*)    Potassium 6.2 (*)    Chloride 94 (*)    Glucose, Bld 345 (*)    BUN 67 (*)    Creatinine, Ser 9.40 (*)    Calcium 7.8 (*)    Total Protein 5.7 (*)    Albumin 3.3 (*)    GFR, Estimated 6 (*)    All other components within normal limits  PROTIME-INR - Abnormal; Notable for the following components:   Prothrombin Time 15.3 (*)    INR 1.3 (*)    All other components within normal limits  APTT - Abnormal; Notable for the  following components:   aPTT 39 (*)    All other components within normal limits  I-STAT VENOUS BLOOD GAS, ED - Abnormal; Notable for the following components:   pO2, Ven 49.0 (*)    Sodium 131 (*)    Potassium 6.2 (*)    Calcium, Ion 0.98 (*)    HCT 20.0 (*)    Hemoglobin 6.8 (*)    All other components within normal limits  I-STAT CHEM 8, ED - Abnormal; Notable for the following components:   Sodium 131 (*)    Potassium 6.2 (*)    Chloride 96 (*)    BUN 75 (*)    Creatinine, Ser 9.70 (*)    Glucose, Bld 329 (*)    Calcium, Ion 0.98 (*)    Hemoglobin 6.8 (*)    HCT 20.0 (*)    All other components within normal limits  TROPONIN I (HIGH SENSITIVITY) - Abnormal; Notable for the following components:   Troponin I (High Sensitivity) 44 (*)    All other components within normal limits   ____________________________________________ Ordered   CXR - NON acute  _________________________ Troponin 44  ECG: Ordered Personally reviewed by me showing: HR : 57 Rhythm: SB, incomplete  RBBB,    nonspecific changes  QTC 483 ____________  The recent clinical data is shown below. Vitals:   12/15/20 1815 12/15/20 1830 12/15/20 1845 12/15/20 1900  BP: (!) 143/52 (!) 148/51 (!) 142/56 (!) 138/49  Pulse: (!) 58 (!) 57 (!) 57 (!) 58  Resp: '20 15 17 15  '$ Temp:      TempSrc:      SpO2: 96% 95% 96% 97%   WBC     Component Value Date/Time   WBC 4.5 12/15/2020 1636   LYMPHSABS 0.9 12/15/2020 1636   MONOABS 0.5 12/15/2020 1636   EOSABS 0.2 12/15/2020 1636   BASOSABS 0.0 12/15/2020 1636  UA  not ordered     Results for orders placed or performed during the hospital encounter of 12/15/20  Resp Panel by RT-PCR (Flu A&B, Covid) Nasopharyngeal Swab     Status: None   Collection Time: 12/15/20  8:57 PM   Specimen: Nasopharyngeal Swab; Nasopharyngeal(NP) swabs in vial transport medium  Result Value Ref Range Status   SARS Coronavirus 2 by RT PCR NEGATIVE NEGATIVE Final          Influenza A by PCR NEGATIVE NEGATIVE Final   Influenza B by PCR NEGATIVE NEGATIVE Final           _______________________________________________________ ER Provider Called:     Dr. Joylene Grapes They Recommend admit to medicine  Plan for HD tonight   _______________________________________________ Hospitalist was called for admission for hyperkalemia, symptomatic bradycardia and symptomatic anemia  The following Work up has been ordered so far:  Orders Placed This Encounter  Procedures  . DG Chest Portable 1 View  . CBC with Differential  . Comprehensive metabolic panel  . Brain natriuretic peptide  . Protime-INR  . APTT  . Place Patient on a Cardiac Monitor  . Initiate Carrier Fluid Protocol  . Pre-Hemodialysis Protocol - Day of Dialysis  . Post-Dialysis Protocol - Day of Dialysis  . Informed Consent Details: Physician/Practitioner Attestation; Transcribe to consent form and obtain patient signature  . Inpatient consult to Cardiology  . Consult to nephrology  . Consult for [facility] Admission  . I-Stat venous blood gas, [facility] ED)  . I-stat chem 8, ED (not at [facility] or [facility])  . POC CBG, ED  . CBG monitoring, ED  . POC occult blood, ED  . I-stat chem 8, ED (not at [facility] or [facility])  . EKG 12-Lead  . ED EKG  . EKG 12-Lead  . Type and screen [place]  . Prepare RBC (crossmatch)  . Hemodialysis inpatient    Following Medications were ordered in ER: Medications  Chlorhexidine Gluconate Cloth 2 % PADS 6 each (has no administration in time range)  insulin aspart (novoLOG) injection 5 Units (has no administration in time range)  0.9 %  sodium chloride infusion (has no administration in time range)  calcium gluconate 1 g/ 50 mL sodium chloride IVPB (0 g Intravenous Stopped 12/15/20 1827)  insulin aspart (novoLOG) injection 5 Units (5 Units Intravenous Given 12/15/20 1737)    And  dextrose 50 % solution 50 mL (50 mLs Intravenous Given 12/15/20 1739)  sodium zirconium  cyclosilicate (LOKELMA) packet 10 g (10 g Oral Given 12/15/20 1741)     OTHER Significant initial  Findings:  labs showing:    Recent Labs  Lab 12/15/20 1636 12/15/20 1707 12/15/20 1708  NA 132* 131* 131*  K 6.2* 6.2* 6.2*  CO2 26  --   --   GLUCOSE 345* 329*  --   BUN 67* 75*  --   CREATININE 9.40* 9.70*  --   CALCIUM 7.8*  --   --     Cr  ,  Up from baseline see below Lab Results  Component Value Date   CREATININE 9.70 (H) 12/15/2020   CREATININE 9.40 (H) 12/15/2020   CREATININE 7.31 (H) 12/12/2019    Recent Labs  Lab 12/15/20 1636  AST 18  ALT 17  ALKPHOS 82  BILITOT 0.6  PROT 5.7*  ALBUMIN 3.3*   Lab Results  Component Value Date   CALCIUM 7.8 (L) 12/15/2020   PHOS 3.1 04/14/2019      Plt:  Lab Results  Component Value Date   PLT 97 (L) 12/15/2020      Recent Labs  Lab 12/15/20 1636 12/15/20 1707 12/15/20 1708  WBC 4.5  --   --   NEUTROABS 2.8  --   --   HGB 7.1* 6.8* 6.8*  HCT 21.9* 20.0* 20.0*  MCV 100.9*  --   --   PLT 97*  --   --     HG/HCT    Down  from baseline see below    Component Value Date/Time   HGB 6.8 (LL) 12/15/2020 1708   HCT 20.0 (L) 12/15/2020 1708   MCV 100.9 (H) 12/15/2020 1636    DM  labs:  HbA1C: No results for input(s): HGBA1C in the last 8760 hours.     CBG (last 3)  Recent Labs    12/15/20 1959  GLUCAP 154*         Cultures:    Component Value Date/Time   SDES  10/13/2017 1552    TOE LEFT FOURTH Performed at [facility], [place] [address] [place], [place] [address]    [facility]  10/13/2017 1552    NONE Performed at [facility], [address] [place], [place] [address]    CULT  10/13/2017 1552    ABUNDANT METHICILLIN RESISTANT STAPHYLOCOCCUS AUREUS   REPTSTATUS 10/16/2017 FINAL 10/13/2017 1552     Radiological Exams on Admission: DG Chest Portable 1 View  Result Date: 12/15/2020 CLINICAL DATA:  Chest pain. EXAM: PORTABLE CHEST 1 VIEW COMPARISON:  September 07, 2019 FINDINGS:  The heart, hila, and mediastinum are normal. No pneumothorax. No nodules or masses. No suspicious infiltrates or overt edema. IMPRESSION: No active disease. Electronically Signed   By: Dorise Bullion III M.D   On: 12/15/2020 17:41   _______________________________________________________________________________________________________ Latest  Blood pressure (!) 138/49, pulse (!) 58, temperature 97.6 F (36.4 C), temperature source Oral, resp. rate 15, SpO2 97 %.   Review of Systems:    Pertinent positives include:   fatigue, chest pain,   Constitutional:  No weight loss, night sweats, Fevers, chills,weight loss  HEENT:  No headaches, Difficulty swallowing,Tooth/dental problems,Sore throat,  No sneezing, itching, ear ache, nasal congestion, post nasal drip,  Cardio-vascular:  No Orthopnea, PND, anasarca, dizziness, palpitations.no Bilateral lower extremity swelling  GI:  No heartburn, indigestion, abdominal pain, nausea, vomiting, diarrhea, change in bowel habits, loss of appetite, melena, blood in stool, hematemesis Resp:  no shortness of breath at rest. No dyspnea on exertion, No excess mucus, no productive cough, No non-productive cough, No coughing up of blood.No change in color of mucus.No wheezing. Skin:  no rash or lesions. No jaundice GU:  no dysuria, change in color of urine, no urgency or frequency. No straining to urinate.  No flank pain.  Musculoskeletal:  No joint pain or no joint swelling. No decreased range of motion. No back pain.  Psych:  No change in mood or affect. No depression or anxiety. No memory loss.  Neuro: no localizing neurological complaints, no tingling, no weakness, no double vision, no gait abnormality, no slurred speech, no confusion  All systems reviewed and apart from [place] all are negative _______________________________________________________________________________________________ Past Medical History:   Past Medical History:  Diagnosis Date   . Acute pulmonary edema ([place]) 05/31/2016  . Acute respiratory failure with hypoxemia ([place]) 05/31/2016  . CAD (coronary artery disease) 10/30/2016   NSTEMI 9/17 with CABG x 3 (LIMA-LAD, SVG-OM, SVG-RCA).   - NSTEMI 10/18 s/p DES to ostial SVG  to  OM  . Depression   . Diabetic foot ulcer ([place]) 04/11/2017  . 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 ESRD (end stage renal disease) on dialysis [facility])    "MWF; [place]" (03/30/2018)  . Gastroesophageal reflux disease 08/18/2016  . HCAP (healthcare-associated pneumonia)   . Hemodialysis status ([place])   . Hyperlipidemia   . Hypertension   . Hypertensive heart disease 09/12/2017  . Ischemic rest pain of lower extremity 02/06/2015  . 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 NSTEMI (non-ST elevated myocardial infarction) ([place])   . PAF (paroxysmal atrial fibrillation) ([place])   . Pleural effusion   . Pronation deformity of both feet 02/27/2015      Past Surgical History:  Procedure Laterality Date  . AV FISTULA PLACEMENT Left 06/22/2016   Procedure: ARTERIOVENOUS (AV) FISTULA CREATION LEFT UPPER ARM;  Surgeon: Rosetta Posner, MD;  Location: [place];  Service: Vascular;  Laterality: Left;  . CARDIAC CATHETERIZATION N/A 05/31/2016   Procedure: Left Heart Cath and Coronary Angiography;  Surgeon: Jettie Booze, MD;  Location: [place] CV LAB;  Service: Cardiovascular;  Laterality: N/A;  . CARDIAC CATHETERIZATION N/A 05/31/2016   Procedure: Right Heart Cath;  Surgeon: Jettie Booze, MD;  Location: [place] CV LAB;  Service: Cardiovascular;  Laterality: N/A;  . CARDIAC CATHETERIZATION N/A 05/31/2016   Procedure: IABP Insertion;  Surgeon: Jettie Booze, MD;  Location: [place] CV LAB;  Service: Cardiovascular;  Laterality: N/A;  . CORONARY ARTERY BYPASS GRAFT N/A 06/05/2016   Procedure: CORONARY ARTERY BYPASS GRAFTING (CABG) x3 LIMA to LAD -SVG to OM -SVG to RCA;  Surgeon: Ivin Poot, MD;  Location: [place];  Service: Open Heart Surgery;  Laterality: N/A;  . CORONARY STENT INTERVENTION N/A 07/07/2017   Procedure: CORONARY STENT INTERVENTION;  Surgeon: Wellington Hampshire, MD;  Location: [place] CV LAB;  Service: Cardiovascular;  Laterality: N/A;  . CORONARY STENT INTERVENTION N/A 03/30/2018   Procedure: CORONARY STENT INTERVENTION;  Surgeon: Martinique, Peter M, MD;  Location: [place] CV LAB;  Service: Cardiovascular;  Laterality: N/A;  . INSERTION OF DIALYSIS CATHETER N/A 06/05/2016   Procedure: INSERTION OF DIALYSIS/trialysis CATHETER;  Surgeon: Ivin Poot, MD;  Location: [place];  Service: Vascular;  Laterality: N/A;  . INSERTION OF DIALYSIS CATHETER Right 06/13/2016   Procedure: INSERTION OF DIALYSIS CATHETER RIGHT INTERNAL JUGULAR;  Surgeon: Conrad Granville, MD;  Location: [place];  Service: Vascular;  Laterality: Right;  . INSERTION OF DIALYSIS CATHETER Right 09/07/2019   Procedure: Insertion Of Dialysis Catheter;  Surgeon: Rosetta Posner, MD;  Location: [facility] OR;  Service: Vascular;  Laterality: Right;  . INTRAOPERATIVE TRANSESOPHAGEAL ECHOCARDIOGRAM N/A 06/05/2016   Procedure: INTRAOPERATIVE TRANSESOPHAGEAL ECHOCARDIOGRAM;  Surgeon: Ivin Poot, MD;  Location: [place];  Service: Open Heart Surgery;  Laterality: N/A;  . LEFT HEART CATH AND CORS/GRAFTS ANGIOGRAPHY N/A 11/03/2016   Procedure: Left Heart Cath and Cors/Grafts Angiography;  Surgeon: Troy Sine, MD;  Location: [place] CV LAB;  Service: Cardiovascular;  Laterality: N/A;  . LEFT HEART CATH AND CORS/GRAFTS ANGIOGRAPHY N/A 07/07/2017   Procedure: LEFT HEART CATH AND CORS/GRAFTS ANGIOGRAPHY;  Surgeon: Larey Dresser, MD;  Location: [place] [facility] LAB;  Service: Cardiovascular;  Laterality: N/A;  . LEFT HEART CATH AND CORS/GRAFTS ANGIOGRAPHY N/A 03/30/2018   Procedure: LEFT HEART CATH AND CORS/GRAFTS ANGIOGRAPHY;  Surgeon: Martinique, Peter M, MD;  Location: [place] CV LAB;  Service:  Cardiovascular;  Laterality: N/A;  . LEFT HEART  CATH AND CORS/GRAFTS ANGIOGRAPHY N/A 04/17/2019   Procedure: LEFT HEART CATH AND CORS/GRAFTS ANGIOGRAPHY;  Surgeon: Larey Dresser, MD;  Location: [place] [facility] LAB;  Service: Cardiovascular;  Laterality: N/A;  . REVISION OF ARTERIOVENOUS GORETEX GRAFT Left [id number]   Procedure: PLICATION OF ANEURYSM OF ARTERIOVENOUS FISTULA  LEFT ARM;  Surgeon: Rosetta Posner, MD;  Location: [place];  Service: Vascular;  Laterality: Left;  . REVISON OF ARTERIOVENOUS FISTULA Left 09/07/2019   Procedure: REVISON OF LEFT UPPER ARM ARTERIOVENOUS FISTULA;  Surgeon: Rosetta Posner, MD;  Location: [facility] OR;  Service: Vascular;  Laterality: Left;    Social History:  Ambulatory   independently      reports that he quit smoking about 8 years ago. His smoking use included cigarettes. He has a 52.50 pack-year smoking history. He has never used smokeless tobacco. He reports that he does not drink alcohol and does not use drugs.   Family History:   Family History  Problem Relation Age of Onset  . CAD Father   . Colon cancer Father   . Diabetes Brother    ______________________________________________________________________________________________ Allergies: No Known Allergies   Prior to Admission medications   Medication Sig Start Date End Date Taking? Authorizing Provider  acetaminophen (TYLENOL) 500 MG tablet Take 1,000 mg by mouth every 6 (six) hours as needed for mild pain or headache.     [provider]  amLODipine (NORVASC) 10 MG tablet Take 10 mg by mouth at bedtime.     [provider]  Ascorbic Acid (VITAMIN C) 1000 MG tablet Take 1,000 mg by mouth 3 (three) times daily.     [provider]  atorvastatin (LIPITOR) 80 MG tablet Take 1 tablet by mouth once daily 12/22/19   Larey Dresser, MD  calcium acetate (PHOSLO) 667 MG capsule Take 667 mg by mouth 3 (three) times daily with meals.  07/09/17   [provider]  citalopram (CELEXA) 20 MG tablet Take 20 mg by mouth daily  with lunch.  02/25/17   [provider]  ELIQUIS 5 MG TABS tablet Take 1 tablet by mouth twice daily 02/06/20   Larey Dresser, MD  ezetimibe (ZETIA) 10 MG tablet Take 1 tablet by mouth once daily 02/06/20   Larey Dresser, MD  gabapentin (NEURONTIN) 100 MG capsule Take 100 mg by mouth daily with lunch.  02/11/17   [provider]  hydrALAZINE (APRESOLINE) 25 MG tablet TAKE 1 TABLET BY MOUTH EVERY 8 HOURS 12/13/20   Larey Dresser, MD  Insulin Human (INSULIN PUMP) SOLN Inject into the skin continuous. NOVOLOG    [provider]  isosorbide mononitrate (IMDUR) 60 MG 24 hr tablet Take 1 tablet by mouth once daily 05/27/20   Larey Dresser, MD  losartan (COZAAR) 100 MG tablet Take 100 mg by mouth at bedtime.     [provider]  metoprolol succinate (TOPROL-XL) 25 MG 24 hr tablet TAKE 1 TABLET BY MOUTH AT BEDTIME 07/17/19   Larey Dresser, MD  NOVOLOG 100 UNIT/ML injection Inject 6-10 Units into the skin See admin instructions. Starts with 10 units per day and adds 6 units with each meal via pump 01/08/19   [provider]  oxyCODONE-acetaminophen (PERCOCET) 7.5-325 MG tablet Take 1 tablet by mouth every 4 (four) hours as needed for severe pain.    [provider]  pantoprazole (PROTONIX) 40 MG tablet Take 40 mg by mouth  daily before lunch.  01/07/17   [provider]    ___________________________________________________________________________________________________ Physical Exam: Vitals with BMI 12/15/2020 12/15/2020 12/15/2020  Height - - -  Weight - - -  BMI - - -  Systolic 0000000 [id number]  Diastolic 49 56 51  Pulse 58 57 57     1. General:  in No  Acute distress fatigued appearing   Chronically ill  -appearing 2. Psychological: Alert and  Oriented 3. Head/ENT:     Dry Mucous Membranes                          Head Non traumatic, neck supple                           Poor Dentition 4. SKIN:   decreased Skin turgor,  Skin clean  Dry and intact no rash 5. Heart: Regular rate and rhythm no  Murmur, no Rub or gallop 6. Lungs: no wheezes or crackles   7. Abdomen: Soft,  non-tender, Non distended bowel sounds present 8. Lower extremities: no clubbing, cyanosis, no edema 9. Neurologically Grossly intact, moving all 4 extremities equally  10. MSK: Normal range of motion   Chart has been reviewed  ______________________________________________________________________________________________  Assessment/Plan  65 y.o. male with medical history significant of HLD, HTN, DM1  A.fib, ESRD on HD, CAD sp CABG Admitted for hyperkalemia, symptomatic bradycardia and symptomatic anemia  Present on Admission: . Symptomatic bradycardia -improved with calcium administration patient has chronic bradycardia was discussed with cardiology recommended correcting electrolytes check TSH hold beta-blocker if persists and patient continues to be symptomatic secondary to bradycardia would reconsult in a.m. Fatigue could be also secondary to symptomatic anemia Other plan as per orders.  . Hyperkalemia treated initially in emergency department now plan to dialyze tonight appreciate nephrology input . ESRD (end stage renal disease) [facility]) -nephrology aware plan to dialyze tonight . Symptomatic anemia  -transfuse 1 unit and continue to follow serial CBC  . Blood in stool -suspect lower GI bleed -hold Eliquis appreciate [facility] consult continue serial CBC make sure patient has IV access   . Chronic diastolic CHF (congestive heart failure) (HCC) -manage with dialysis  . Chest pain -in the setting of bradycardia currently improving continue to follow symptomatically plan to dialyze tonight cycle cardiac enzymes Initial troponin 44 which is below patient's baseline  . CAD (coronary artery disease) continue statins hold beta-blocker given bradycardia  . AF (paroxysmal atrial fibrillation) (HCC) -hold Eliquis resume home medications when able to  tolerate   . Hyperlipidemia -chronic stable resume home medications  Diabetes mellitus -continue insulin pump for now and sliding scale diabetes coordinator consult ordered  . Gastroesophageal reflux disease continue PPI   DVT prophylaxis:  SCD        Code Status:    Code Status: Prior FULL CODE  as per patient   I had personally discussed CODE STATUS with patient    Family Communication:   Family not at  Bedside    Disposition Plan:      To home once workup is complete and patient is stable   Following barriers for discharge:                            Electrolytes corrected  Anemia corrected                                                                                        Will need to be able to tolerate PO                                                      Will need consultants to evaluate patient prior to discharge                       Would benefit from PT/OT eval prior to DC  Ordered                                      Diabetes care coordinator                                       Consults called: Nephrology is aware, [facility] is aware Please re consult cardiology if persistent symptomatic bradycardia after K normal and BB ws held    Admission status:  ED Disposition    ED Disposition Condition [place]: [place] [100100]  Level of Care: Progressive [102]  Admit to Progressive based on following criteria: CARDIOVASCULAR & THORACIC of moderate stability with acute coronary syndrome symptoms/low risk myocardial infarction/hypertensive urgency/arrhythmias/heart failure potentially compromising stability and stable post cardiovascular intervention patients.  May admit patient to [facility] or [facility] if equivalent level of care is available:: No  Covid Evaluation: Asymptomatic Screening Protocol (No Symptoms)  Diagnosis: Symptomatic bradycardia [id number]  Admitting Physician: Toy Baker [[id number]]  Attending Physician: Toy Baker [[id number]]  Estimated length of stay: past midnight tomorrow  Certification:: I certify this patient will need inpatient services for at least 2 midnights         inpatient     I Expect 2 midnight stay secondary to severity of patient's current illness need for inpatient interventions justified by the following:  hemodynamic instability despite optimal treatment (bradycardia )  Severe lab/radiological/exam abnormalities including:    K 6.2 and extensive comorbidities including:    DM2   CHF   CAD   ESRD .   Marland Kitchen Chronic anticoagulation  That are currently affecting medical management.   I expect  patient to be hospitalized for 2 midnights requiring inpatient medical care.  Patient is at high risk for adverse outcome (such as loss of life or disability) if not treated.  Indication for inpatient stay as follows:    Need for operative/procedural  intervention   Need for emergent HD    Level of care  Progressive tele indefinitely please discontinue once patient no longer qualifies COVID-19 Labs    Lab Results  Component Value Date   [place] NEGATIVE 12/15/2020     Precautions:  admitted as  Covid Negative  PPE: Used by the provider:   N95  eye Goggles,  Gloves      Ainsley Deakins 12/15/2020, 11:18 PM    [facility]     after 2 AM please page floor coverage PA If 7AM-7PM, please contact the day team taking care of the patient using [email or web address]   Patient was evaluated in the context of the global COVID-19 pandemic, which necessitated consideration that the patient might be at risk for infection with the SARS-CoV-2 virus that causes COVID-19. Institutional protocols and algorithms that pertain to the evaluation of patients at risk for COVID-19 are in a state of rapid change based on information released by regulatory bodies including the CDC and federal and state organizations. These policies and  algorithms were followed during the patient's care.

## 2020-12-15 NOTE — ED Provider Notes (Signed)
[facility] EMERGENCY DEPARTMENT Provider Note   CSN: [id number] Arrival date & time: 12/15/20  1619     History Chief Complaint  Patient presents with  . Bradycardia    Kenneth [name] is a 65 y.o. male.  Patient is a 65 year old male with a history of type II diabetes, end-stage renal disease on hemodialysis Monday Wednesday Friday, CAD, and HFrEF who presents with bradycardia and chest pain.  Patient reports that his symptoms started a few days ago.  He started to feel dizzy and lightheaded.  States that he was taking his blood pressure at home and it was lower than normal.  Patient has a history of bradycardia so he did not think anything of his heart rate being low.  Patient currently endorses fatigue and weakness.  His weakness is very generalized.  He reports left-sided chest pain that is sharp.  Does not radiate to his arm or his neck.  He denies any nausea, vomiting, or diaphoresis.  Patient is resting in bed with his eyes closed.  He will respond to voice but is with decreased level of consciousness.  Wife at bedside reports that he had a full dialysis session on Friday without any difficulties.  No additional abnormalities at home.  Denies fevers chills and coughing.  EMS gave 1 of atropine in route.  No change in patient's heart rate.        Past Medical History:  Diagnosis Date  . Acute pulmonary edema ([place]) 05/31/2016  . Acute respiratory failure with hypoxemia ([place]) 05/31/2016  . CAD (coronary artery disease) 10/30/2016   NSTEMI 9/17 with CABG x 3 (LIMA-LAD, SVG-OM, SVG-RCA).   - NSTEMI 10/18 s/p DES to ostial SVG to  OM  . Depression   . Diabetic foot ulcer ([place]) 04/11/2017  . 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 ESRD (end stage renal disease) on dialysis [facility])    "MWF; [place]" (03/30/2018)  . Gastroesophageal reflux disease 08/18/2016  . HCAP (healthcare-associated pneumonia)   . Hemodialysis status ([place])   . Hyperlipidemia   .  Hypertension   . Hypertensive heart disease 09/12/2017  . Ischemic rest pain of lower extremity 02/06/2015  . 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 NSTEMI (non-ST elevated myocardial infarction) ([place])   . PAF (paroxysmal atrial fibrillation) ([place])   . Pleural effusion   . 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 Symptomatic bradycardia 12/15/2020  . Postural dizziness with presyncope 12/15/2020  . Symptomatic anemia 12/15/2020  . Blood in stool 12/15/2020  . Acute metabolic encephalopathy 123456  . Altered mental status 08/27/2019  . Unresponsive episode 08/27/2019  . AF (paroxysmal atrial fibrillation) ([place]) 08/27/2019  . Status post coronary artery stent placement   . NSTEMI (non-ST elevated myocardial infarction) ([place]) 03/30/2018  . Hypertensive heart disease 09/12/2017  . Hyperlipidemia 09/12/2017  . Hyperkalemia 07/20/2017  . Chronic diastolic CHF (congestive heart failure) ([place]) 04/14/2017  . Hypertensive emergency 04/14/2017  . Anemia, chronic renal failure 04/11/2017  . Diabetic foot ulcer ([place]) 04/11/2017  . Cellulitis in diabetic foot ([place]) 01/18/2017  . Chest pain 12/16/2016  . Pressure injury of skin 11/03/2016  . Diabetes mellitus with complication ([place]) 123456  . ESRD (end stage renal disease) ([place]) 10/30/2016  . CAD (coronary artery disease) 10/30/2016  . Gastroesophageal reflux disease 08/18/2016  . Non-ST elevation (NSTEMI) myocardial infarction ([place])   . Pronation  deformity of both feet 02/27/2015  . 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[place]) 02/06/2015  . Ischemic rest pain of lower extremity ([place]) 02/06/2015    Past Surgical History:  Procedure Laterality Date  . AV FISTULA PLACEMENT Left 06/22/2016   Procedure: ARTERIOVENOUS (AV) FISTULA CREATION LEFT UPPER ARM;  Surgeon: Rosetta Posner, MD;  Location: [facility];  Service: Vascular;  Laterality: Left;   . CARDIAC CATHETERIZATION N/A 05/31/2016   Procedure: Left Heart Cath and Coronary Angiography;  Surgeon: Jettie Booze, MD;  Location: [facility] CV LAB;  Service: Cardiovascular;  Laterality: N/A;  . CARDIAC CATHETERIZATION N/A 05/31/2016   Procedure: Right Heart Cath;  Surgeon: Jettie Booze, MD;  Location: [facility] CV LAB;  Service: Cardiovascular;  Laterality: N/A;  . CARDIAC CATHETERIZATION N/A 05/31/2016   Procedure: IABP Insertion;  Surgeon: Jettie Booze, MD;  Location: [facility] CV LAB;  Service: Cardiovascular;  Laterality: N/A;  . CORONARY ARTERY BYPASS GRAFT N/A 06/05/2016   Procedure: CORONARY ARTERY BYPASS GRAFTING (CABG) x3 LIMA to LAD -SVG to OM -SVG to RCA;  Surgeon: Ivin Poot, MD;  Location: [facility];  Service: Open Heart Surgery;  Laterality: N/A;  . CORONARY STENT INTERVENTION N/A 07/07/2017   Procedure: CORONARY STENT INTERVENTION;  Surgeon: Wellington Hampshire, MD;  Location: [facility] CV LAB;  Service: Cardiovascular;  Laterality: N/A;  . CORONARY STENT INTERVENTION N/A 03/30/2018   Procedure: CORONARY STENT INTERVENTION;  Surgeon: Martin[place], Peter M, MD;  Location: [facility] CV LAB;  Service: Cardiovascular;  Laterality: N/A;  . INSERTION OF DIALYSIS CATHETER N/A 06/05/2016   Procedure: INSERTION OF DIALYSIS/trialysis CATHETER;  Surgeon: Ivin Poot, MD;  Location: [facility];  Service: Vascular;  Laterality: N/A;  . INSERTION OF DIALYSIS CATHETER Right 06/13/2016   Procedure: INSERTION OF DIALYSIS CATHETER RIGHT INTERNAL JUGULAR;  Surgeon: Conrad [place], MD;  Location: [facility];  Service: Vascular;  Laterality: Right;  . INSERTION OF DIALYSIS CATHETER Right 09/07/2019   Procedure: Insertion Of Dialysis Catheter;  Surgeon: Rosetta Posner, MD;  Location: [facility] OR;  Service: Vascular;  Laterality: Right;  . INTRAOPERATIVE TRANSESOPHAGEAL ECHOCARDIOGRAM N/A 06/05/2016   Procedure: INTRAOPERATIVE TRANSESOPHAGEAL ECHOCARDIOGRAM;  Surgeon: Ivin Poot, MD;   Location: [facility];  Service: Open Heart Surgery;  Laterality: N/A;  . LEFT HEART CATH AND CORS/GRAFTS ANGIOGRAPHY N/A 11/03/2016   Procedure: Left Heart Cath and Cors/Grafts Angiography;  Surgeon: Troy Sine, MD;  Location: [place] [facility] CV LAB;  Service: Cardiovascular;  Laterality: N/A;  . LEFT HEART CATH AND CORS/GRAFTS ANGIOGRAPHY N/A 07/07/2017   Procedure: LEFT HEART CATH AND CORS/GRAFTS ANGIOGRAPHY;  Surgeon: Larey Dresser, MD;  Location: [facility] CV LAB;  Service: Cardiovascular;  Laterality: N/A;  . LEFT HEART CATH AND CORS/GRAFTS ANGIOGRAPHY N/A 03/30/2018   Procedure: LEFT HEART CATH AND CORS/GRAFTS ANGIOGRAPHY;  Surgeon: Martinique, Peter M, MD;  Location: [facility] CV LAB;  Service: Cardiovascular;  Laterality: N/A;  . LEFT HEART CATH AND CORS/GRAFTS ANGIOGRAPHY N/A 04/17/2019   Procedure: LEFT HEART CATH AND CORS/GRAFTS ANGIOGRAPHY;  Surgeon: Larey Dresser, MD;  Location: [facility] CV LAB;  Service: Cardiovascular;  Laterality: N/A;  . REVISION OF ARTERIOVENOUS GORETEX GRAFT Left [id number]   Procedure: PLICATION OF ANEURYSM OF ARTERIOVENOUS FISTULA  LEFT ARM;  Surgeon: Rosetta Posner, MD;  Location: [facility];  Service: Vascular;  Laterality: Left;  . REVISON OF ARTERIOVENOUS FISTULA Left 09/07/2019   Procedure: REVISON OF LEFT UPPER ARM ARTERIOVENOUS FISTULA;  Surgeon: Rosetta Posner, MD;  Location: [facility] OR;  Service: Vascular;  Laterality: Left;       Family History  Problem Relation Age of Onset  . CAD Father   . Colon cancer Father   . Diabetes Brother     Social History   Tobacco Use  . Smoking status: Former Smoker    Packs/day: 1.50    Years: 35.00    Pack years: 52.50    Types: Cigarettes    Quit date: 2014    Years since quitting: 8.2  . Smokeless tobacco: Never Used  . Tobacco comment: "quit e-cigs ~ 2014"  Vaping Use  . Vaping Use: Former  Substance Use Topics  . Alcohol use: Never    Alcohol/week: 0.0 standard drinks  . Drug use: Never    Home  Medications Prior to Admission medications   Medication Sig Start Date End Date Taking? Authorizing Provider  acetaminophen (TYLENOL) 500 MG tablet Take 1,000 mg by mouth every 6 (six) hours as needed for mild pain or headache.     [provider]  amLODipine (NORVASC) 10 MG tablet Take 10 mg by mouth at bedtime.     [provider]  Ascorbic Acid (VITAMIN C) 1000 MG tablet Take 1,000 mg by mouth 3 (three) times daily.     [provider]  atorvastatin (LIPITOR) 80 MG tablet Take 1 tablet by mouth once daily 12/22/19   Larey Dresser, MD  calcium acetate (PHOSLO) 667 MG capsule Take 667 mg by mouth 3 (three) times daily with meals.  07/09/17   [provider]  citalopram (CELEXA) 20 MG tablet Take 20 mg by mouth daily with lunch.  02/25/17   [provider]  ELIQUIS 5 MG TABS tablet Take 1 tablet by mouth twice daily 02/06/20   Larey Dresser, MD  ezetimibe (ZETIA) 10 MG tablet Take 1 tablet by mouth once daily 02/06/20   Larey Dresser, MD  gabapentin (NEURONTIN) 100 MG capsule Take 100 mg by mouth daily with lunch.  02/11/17   [provider]  hydrALAZINE (APRESOLINE) 25 MG tablet TAKE 1 TABLET BY MOUTH EVERY 8 HOURS 12/13/20   Larey Dresser, MD  Insulin Human (INSULIN PUMP) SOLN Inject into the skin continuous. NOVOLOG    [provider]  isosorbide mononitrate (IMDUR) 60 MG 24 hr tablet Take 1 tablet by mouth once daily 05/27/20   Larey Dresser, MD  losartan (COZAAR) 100 MG tablet Take 100 mg by mouth at bedtime.     [provider]  metoprolol succinate (TOPROL-XL) 25 MG 24 hr tablet TAKE 1 TABLET BY MOUTH AT BEDTIME 07/17/19   Larey Dresser, MD  NOVOLOG 100 UNIT/ML injection Inject 6-10 Units into the skin See admin instructions. Starts with 10 units per day and adds 6 units with each meal via pump 01/08/19   [provider]  oxyCODONE-acetaminophen (PERCOCET) 7.5-325 MG tablet Take 1 tablet by mouth every 4  (four) hours as needed for severe pain.    [provider]  pantoprazole (PROTONIX) 40 MG tablet Take 40 mg by mouth daily before lunch.  01/07/17   [provider]    Allergies    Patient has no known allergies.  Review of Systems   Review of Systems  Constitutional: Negative for chills and fever.  HENT: Negative for ear pain and sore throat.   Eyes: Negative for pain and visual disturbance.  Respiratory: Positive for shortness of breath. Negative  for cough.   Cardiovascular: Positive for chest pain. Negative for palpitations.  Gastrointestinal: Negative for abdominal pain and vomiting.  Genitourinary: Negative for dysuria and hematuria.  Musculoskeletal: Negative for back pain and neck pain.  Skin: Negative for color change and rash.  Neurological: Positive for dizziness. Negative for seizures.  All other systems reviewed and are negative.   Physical Exam Updated Vital Signs BP (!) 149/51 (BP Location: Right Arm)   Pulse 63   Temp 98.5 F (36.9 C) (Oral)   Resp 15   SpO2 97%   Physical Exam Vitals and nursing note reviewed.  Constitutional:      Appearance: He is well-developed.  HENT:     Head: Normocephalic and atraumatic.     Nose: Nose normal.     Mouth/Throat:     Mouth: Mucous membranes are moist.     Pharynx: Oropharynx is clear.  Eyes:     Extraocular Movements: Extraocular movements intact.     Conjunctiva/sclera: Conjunctivae normal.  Cardiovascular:     Rate and Rhythm: Regular rhythm. Bradycardia present.     Heart sounds: Normal heart sounds.  Pulmonary:     Effort: Pulmonary effort is normal.     Breath sounds: Normal breath sounds.  Abdominal:     Palpations: Abdomen is soft.     Tenderness: There is no abdominal tenderness.  Musculoskeletal:     Cervical back: Neck supple.     Right lower leg: No edema.     Left lower leg: No edema.  Skin:    General: Skin is warm and dry.  Neurological:     Mental Status: He is alert.      GCS: GCS eye subscore is 3. GCS verbal subscore is 5. GCS motor subscore is 6.     Cranial Nerves: No facial asymmetry.     Sensory: Sensation is intact.     Motor: Motor function is intact.     ED Results / Procedures / Treatments   Labs (all labs ordered are listed, but only abnormal results are displayed) Labs Reviewed  CBC WITH DIFFERENTIAL/PLATELET - Abnormal; Notable for the following components:      Result Value   RBC 2.17 (*)    Hemoglobin 7.1 (*)    HCT 21.9 (*)    MCV 100.9 (*)    Platelets 97 (*)    All other components within normal limits  COMPREHENSIVE METABOLIC PANEL - Abnormal; Notable for the following components:   Sodium 132 (*)    Potassium 6.2 (*)    Chloride 94 (*)    Glucose, Bld 345 (*)    BUN 67 (*)    Creatinine, Ser 9.40 (*)    Calcium 7.8 (*)    Total Protein 5.7 (*)    Albumin 3.3 (*)    GFR, Estimated 6 (*)    All other components within normal limits  PROTIME-INR - Abnormal; Notable for the following components:   Prothrombin Time 15.3 (*)    INR 1.3 (*)    All other components within normal limits  APTT - Abnormal; Notable for the following components:   aPTT 39 (*)    All other components within normal limits  I-STAT VENOUS BLOOD GAS, ED - Abnormal; Notable for the following components:   pO2, Ven 49.0 (*)    Sodium 131 (*)    Potassium 6.2 (*)    Calcium, Ion 0.98 (*)    HCT 20.0 (*)    Hemoglobin 6.8 (*)  All other components within normal limits  I-STAT CHEM 8, ED - Abnormal; Notable for the following components:   Sodium 131 (*)    Potassium 6.2 (*)    Chloride 96 (*)    BUN 75 (*)    Creatinine, Ser 9.70 (*)    Glucose, Bld 329 (*)    Calcium, Ion 0.98 (*)    Hemoglobin 6.8 (*)    HCT 20.0 (*)    All other components within normal limits  CBG MONITORING, ED - Abnormal; Notable for the following components:   Glucose-Capillary 154 (*)    All other components within normal limits  POC OCCULT BLOOD, ED - Abnormal;  Notable for the following components:   Fecal Occult Bld POSITIVE (*)    All other components within normal limits  TROPONIN I (HIGH SENSITIVITY) - Abnormal; Notable for the following components:   Troponin I (High Sensitivity) 44 (*)    All other components within normal limits  RESP PANEL BY RT-PCR (FLU A&B, COVID) ARPGX2  BRAIN NATRIURETIC PEPTIDE  PHOSPHORUS  CBC  CBG MONITORING, ED  I-STAT CHEM 8, ED  TYPE AND SCREEN  PREPARE RBC (CROSSMATCH)  PREPARE RBC (CROSSMATCH)  TROPONIN I (HIGH SENSITIVITY)    EKG EKG Interpretation  Date/Time:  Sunday December 15 2020 16:23:52 EDT Ventricular Rate:  56 PR Interval:  194 QRS Duration: 125 QT Interval:  481 QTC Calculation: 465 R Axis:   100 Text Interpretation: Unknown rhythm, irregular rate Bradycardia appears junctional, occasional pw does not appear to follow pattern Consider left ventricular hypertrophy Inferior infarct, age indeterminate Anterior ST elevation, probably due to LVH Lateral leads are also involved Confirmed by Gareth Morgan [phone]) on 12/15/2020 5:21:13 PM   Radiology DG Chest Portable 1 View  Result Date: 12/15/2020 CLINICAL DATA:  Chest pain. EXAM: PORTABLE CHEST 1 VIEW COMPARISON:  September 07, 2019 FINDINGS: The heart, hila, and mediastinum are normal. No pneumothorax. No nodules or masses. No suspicious infiltrates or overt edema. IMPRESSION: No active disease. Electronically Signed   By: Dorise Bullion III M.D   On: 12/15/2020 17:41    Procedures Procedures  Medications Ordered in ED Medications  Chlorhexidine Gluconate Cloth 2 % PADS 6 each (has no administration in time range)  insulin aspart (novoLOG) injection 5 Units (has no administration in time range)  0.9 %  sodium chloride infusion (has no administration in time range)  cinacalcet (SENSIPAR) tablet 60 mg (has no administration in time range)  Darbepoetin Alfa (ARANESP) injection 60 mcg (has no administration in time range)  pantoprazole  (PROTONIX) injection 40 mg (has no administration in time range)  0.9 %  sodium chloride infusion (Manually program via Guardrails IV Fluids) (has no administration in time range)  calcium gluconate 1 g/ 50 mL sodium chloride IVPB (0 g Intravenous Stopped 12/15/20 1827)  insulin aspart (novoLOG) injection 5 Units (5 Units Intravenous Given 12/15/20 1737)    And  dextrose 50 % solution 50 mL (50 mLs Intravenous Given 12/15/20 1739)  sodium zirconium cyclosilicate (LOKELMA) packet 10 g (10 g Oral Given 12/15/20 1741)    ED Course  I have reviewed the triage vital signs and the nursing notes.  Pertinent labs & imaging results that were available during my care of the patient were reviewed by me and considered in my medical decision making (see chart for details).    MDM Rules/Calculators/A&P  End-stage renal disease patient on hemodialysis Monday Wednesday Friday.  Last dialyzed on Friday for full session.  EKG with evidence for bradycardia.  Does not appear to be sinus rhythm.  Intermittent P waves noted.  Rhythm does not appear to be heart block.  Cardiology consulted for further evaluation of patient's heart rhythm.  With irregularity in patient's symptoms, ZOLL pads placed on patient.  Monitor in the room.   I-STAT chemistry with potassium 6.2.  Given patient's abnormal heart rhythm and elevated potassium, IV calcium ordered, dextrose and insulin ordered, and Lokelma.  Nephrology consulted for dialysis evaluation.  Patient's rhythm sinus bradycardia on the monitor with calcium running. Repeat EKG with sinus bradycardia. Evidence for inferior Q waves (present on previous EKG). T wave inversion with ST depression noted in V5 and V6 (also noted on EKG performed on 2/172022. No other concerning changes.  Hemoglobin 6.8 on iSTAT, 7.1 on CBC. Type and screened. Due to patient's symptomatic anemia, will transfuse 1pRBC now. Patient denies dark stools. Guaiac POCT performed. Stool  positive for blood.  Troponin similar to prior. COVID negative.  Patient with drastic improvement of mental status with conversion to sinus bradycardia. Still awaiting blood. Patient to be admitted for dialysis treatment and further interrogation of likely GIB. Medicine consulted for admission.   Final Clinical Impression(s) / ED Diagnoses Final diagnoses:  Symptomatic bradycardia  Anemia, unspecified type    Rx / DC Orders ED Discharge Orders    None       Camdan Burdi, [place], MD 12/15/20 [id number]    Gareth Morgan, MD 12/16/20 1240

## 2020-12-15 NOTE — ED Triage Notes (Signed)
Pt arrived to ED via EMS from home. Pt with c/o chest pain mid sternal feels tight and generalized weakness. Pt took 2 nitro's at home. EMS found pt to be bradycardic in the 40's and semi responsive. Pt was given 1 mg of atropine en route with some improvement. Pt heart rate returned 40-60's  IN ED pt is responding to verbal stimuli, reports mid sternal chest pain.

## 2020-12-15 NOTE — ED Notes (Signed)
Zoll pads placed on patient.  

## 2020-12-15 NOTE — Consult Note (Signed)
ESRD Consult Note  Requesting provider: Gareth Morgan Service requesting consult: ER Reason for consult: ESRD, provision of dialysis Indication for acute dialysis?: End Stage Renal Disease  Outpatient dialysis unit: [facility] Outpatient dialysis schedule: MWF Outpatient orders: MonWedFri, 3 hrs 45 min, 180NRe Optiflux, BFR 400, DFR Manual 500 mL/min, EDW 59.5 (kg), Dialysate 2.0 K, 2.0 Ca, AVFistula-Standard,    Assessment/Recommendations: Kenneth [name] is a/an 65 y.o. male with a past medical history notable for ESRD on HD admitted with symptomatic bradycardia.   # ESRD: Plan to dialyze 2.5 hours tonight given hyperkalemia possibly contributing to bradycardia.  Likely to resume regular dialysis tomorrow # Volume/ hypertension: EDW 59.5kg. Attempt to achieve EDW as tolerated # Anemia of Chronic Kidney Disease: Hemoglobin 6.8 was 8.6 on 3/30 concerning for possible blood loss. Transfusion tonight. Currently receiving mircera 81mg q4wks due tomorrow. Will give aranesp 620m weekly. Hold weekly iron given transfusion # Secondary Hyperparathyroidism/Hyperphosphatemia: Sensipar 60107mw/ dialysis. Check phos  # Vascular access: AVF with significant pulsatility and prolonged bleeding. Being evaluated outpt per patient # Symptomatic Bradycardia: Hyperkalemia could be contributing but only mildly elevated.  Dialyzing as above but would recommend cardiology consult for further evaluation  # Additional recommendations: - Dose all meds for creatinine clearance < 10 ml/min  - Unless absolutely necessary, no MRIs with gadolinium.  - Implement save arm precautions.  Prefer needle sticks in the dorsum of the hands or wrists.  No blood pressure measurements in arm. - If blood transfusion is requested during hemodialysis sessions, please alert us [place] to the session.   Recommendations were discussed with the primary team.   History of Present Illness: Kenneth [name] a/an 64 73o. male with a past  medical history of ESRD who presents with symptomatic bradycardia.  Translator was used to help communicate with the patient.  He intermittently communicates in [place] well as [place] at bedside.  The patient states he has had chest pain for months.  He even says up to 5 months.  However, for the past few days he has felt more lightheaded and dizzy.  Today he was walking out of his bedroom when he became very dizzy and his legs gave way and he fell to the ground.  He also had an episode of nonbloody emesis.  He has had persistent and worsening fatigue and weakness.  He says his weakness is generalized and not located to one particular area.  He says that his chest pain comes and goes and is sharp.  He also said that he is having problem with his eye but unable to characterize what is going on.    He states that he last underwent dialysis on Friday with no issues.  The only issue on dialysis recently is bleeding from his fistula.  Wife states that he recently saw vascular surgery and there was plans for an intervention.  In the emergency department he was found to have bradycardia down to the 40s.  He was given atropine in route to the ER with mild improvement.  Labs in the emergency department demonstrated potassium of 6.2, sodium 131 (corrects to normal when accounting for glucose).  Minimally elevated to 44.  Hemoglobin 6.8.   Medications:  Current Facility-Administered Medications  Medication Dose Route Frequency Provider Last Rate Last Admin  . 0.9 %  sodium chloride infusion  10 mL/hr Intravenous Once Kugler, [place]      . [STDerrill Memo 12/16/2020] Chlorhexidine Gluconate Cloth 2 % PADS 6  each  6 each Topical Q0600 Reesa Chew, MD      . insulin aspart (novoLOG) injection 5 Units  5 Units Intravenous Once Kugler, [place], MD       Current Outpatient Medications  Medication Sig Dispense Refill  . acetaminophen (TYLENOL) 500 MG tablet Take 1,000 mg by mouth every 6 (six) hours  as needed for mild pain or headache.     Marland Kitchen amLODipine (NORVASC) 10 MG tablet Take 10 mg by mouth at bedtime.     . Ascorbic Acid (VITAMIN C) 1000 MG tablet Take 1,000 mg by mouth 3 (three) times daily.     Marland Kitchen atorvastatin (LIPITOR) 80 MG tablet Take 1 tablet by mouth once daily 90 tablet 3  . calcium acetate (PHOSLO) 667 MG capsule Take 667 mg by mouth 3 (three) times daily with meals.     . citalopram (CELEXA) 20 MG tablet Take 20 mg by mouth daily with lunch.   1  . ELIQUIS 5 MG TABS tablet Take 1 tablet by mouth twice daily 180 tablet 3  . ezetimibe (ZETIA) 10 MG tablet Take 1 tablet by mouth once daily 90 tablet 3  . gabapentin (NEURONTIN) 100 MG capsule Take 100 mg by mouth daily with lunch.   0  . hydrALAZINE (APRESOLINE) 25 MG tablet TAKE 1 TABLET BY MOUTH EVERY 8 HOURS 270 tablet 0  . Insulin Human (INSULIN PUMP) SOLN Inject into the skin continuous. NOVOLOG    . isosorbide mononitrate (IMDUR) 60 MG 24 hr tablet Take 1 tablet by mouth once daily 90 tablet 3  . losartan (COZAAR) 100 MG tablet Take 100 mg by mouth at bedtime.     . metoprolol succinate (TOPROL-XL) 25 MG 24 hr tablet TAKE 1 TABLET BY MOUTH AT BEDTIME 90 tablet 0  . NOVOLOG 100 UNIT/ML injection Inject 6-10 Units into the skin See admin instructions. Starts with 10 units per day and adds 6 units with each meal via pump    . oxyCODONE-acetaminophen (PERCOCET) 7.5-325 MG tablet Take 1 tablet by mouth every 4 (four) hours as needed for severe pain.    . pantoprazole (PROTONIX) 40 MG tablet Take 40 mg by mouth daily before lunch.   0     ALLERGIES Patient has no known allergies.  MEDICAL HISTORY Past Medical History:  Diagnosis Date  . Acute pulmonary edema ([place]) 05/31/2016  . Acute respiratory failure with hypoxemia ([place]) 05/31/2016  . CAD (coronary artery disease) 10/30/2016   NSTEMI 9/17 with CABG x 3 (LIMA-LAD, SVG-OM, SVG-RCA).   - NSTEMI 10/18 s/p DES to ostial SVG to  OM  . Depression   . Diabetic foot ulcer ([place])  04/11/2017  . 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 ESRD (end stage renal disease) on dialysis [facility])    "MWF; [place]" (03/30/2018)  . Gastroesophageal reflux disease 08/18/2016  . HCAP (healthcare-associated pneumonia)   . Hemodialysis status ([place])   . Hyperlipidemia   . Hypertension   . Hypertensive heart disease 09/12/2017  . Ischemic rest pain of lower extremity 02/06/2015  . 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 NSTEMI (non-ST elevated myocardial infarction) ([place])   . PAF (paroxysmal atrial fibrillation) ([place])   . Pleural effusion   . 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 Marital status: Married    Spouse name: Not on file  . Number of children: Not on file  .  Years of education: Not on file  . Highest education level: Not on file  Occupational History  . Not on file  Tobacco Use  . Smoking status: Former Smoker    Packs/day: 1.50    Years: 35.00    Pack years: 52.50    Types: Cigarettes    Quit date: 2014    Years since quitting: 8.2  . Smokeless tobacco: Never Used  . Tobacco comment: "quit e-cigs ~ 2014"  Vaping Use  . Vaping Use: Former  Substance and Sexual Activity  . Alcohol use: Never    Alcohol/week: 0.0 standard drinks  . Drug use: Never  . Sexual activity: Not on file  Other Topics Concern  . Not on file  Social History Narrative  . Not on file   Social Determinants of Health   Financial Resource Strain: Not on file  Food Insecurity: Not on file  Transportation Needs: Not on file  Physical Activity: Not on file  Stress: Not on file  Social Connections: Not on file  Intimate Partner Violence: Not on file     FAMILY HISTORY Family History  Problem Relation Age of Onset  . CAD Father   . Colon cancer Father   . Diabetes Brother      Review of Systems: 12 systems were reviewed and negative except per HPI  Physical Exam: Vitals:    12/15/20 1845 12/15/20 1900  BP: (!) 142/56 (!) 138/49  Pulse: (!) 57 (!) 58  Resp: 17 15  Temp:    SpO2: 96% 97%   No intake/output data recorded. No intake or output data in the 24 hours ending 12/15/20 1949 General: Tired appearing, lying in bed, no distress HEENT: anicteric sclera, MMM CV: Borderline bradycardia with heart rate around 60, no obvious murmur Lungs: bilateral chest rise, normal wob Abd: soft, non-tender, non-distended Skin: no visible lesions or rashes Psych: Tired but alert, engaged, appropriate mood and affect Neuro: normal speech, tired but no gross focal deficits   Test Results Reviewed Lab Results  Component Value Date   NA 131 (L) 12/15/2020   K 6.2 (H) 12/15/2020   CL 96 (L) 12/15/2020   CO2 26 12/15/2020   BUN 75 (H) 12/15/2020   CREATININE 9.70 (H) 12/15/2020   CALCIUM 7.8 (L) 12/15/2020   ALBUMIN 3.3 (L) 12/15/2020   PHOS 3.1 04/14/2019    I have reviewed relevant outside healthcare records

## 2020-12-16 ENCOUNTER — Encounter (HOSPITAL_COMMUNITY): Payer: Self-pay | Admitting: Internal Medicine

## 2020-12-16 ENCOUNTER — Other Ambulatory Visit: Payer: Self-pay

## 2020-12-16 DIAGNOSIS — D649 Anemia, unspecified: Secondary | ICD-10-CM | POA: Diagnosis not present

## 2020-12-16 DIAGNOSIS — I2510 Atherosclerotic heart disease of native coronary artery without angina pectoris: Secondary | ICD-10-CM

## 2020-12-16 DIAGNOSIS — K921 Melena: Secondary | ICD-10-CM | POA: Diagnosis not present

## 2020-12-16 DIAGNOSIS — R195 Other fecal abnormalities: Secondary | ICD-10-CM

## 2020-12-16 DIAGNOSIS — I5032 Chronic diastolic (congestive) heart failure: Secondary | ICD-10-CM | POA: Diagnosis not present

## 2020-12-16 DIAGNOSIS — I480 Paroxysmal atrial fibrillation: Secondary | ICD-10-CM | POA: Diagnosis not present

## 2020-12-16 DIAGNOSIS — R079 Chest pain, unspecified: Secondary | ICD-10-CM

## 2020-12-16 DIAGNOSIS — R001 Bradycardia, unspecified: Principal | ICD-10-CM

## 2020-12-16 LAB — TROPONIN I (HIGH SENSITIVITY)
Troponin I (High Sensitivity): 45 ng/L — ABNORMAL HIGH (ref <18)
Troponin I (High Sensitivity): 48 ng/L — ABNORMAL HIGH (ref <18)
Troponin I (High Sensitivity): 137 ng/L (ref <18)

## 2020-12-16 LAB — CBC
HCT: 22.6 % — ABNORMAL LOW (ref 39.0–52.0)
Hemoglobin: 7.3 g/dL — ABNORMAL LOW (ref 13.0–17.0)
MCH: 32.3 pg (ref 26.0–34.0)
MCHC: 32.3 g/dL (ref 30.0–36.0)
MCV: 100 fL (ref 80.0–100.0)
Platelets: 112 10*3/uL — ABNORMAL LOW (ref 150–400)
RBC: 2.26 MIL/uL — ABNORMAL LOW (ref 4.22–5.81)
RDW: 15.5 % (ref 11.5–15.5)
WBC: 5 10*3/uL (ref 4.0–10.5)
nRBC: 0 % (ref 0.0–0.2)

## 2020-12-16 LAB — CBC WITH DIFFERENTIAL/PLATELET
Abs Immature Granulocytes: 0.01 10*3/uL (ref 0.00–0.07)
Basophils Absolute: 0 10*3/uL (ref 0.0–0.1)
Basophils Relative: 1 %
Eosinophils Absolute: 0.2 10*3/uL (ref 0.0–0.5)
Eosinophils Relative: 5 %
HCT: 22.1 % — ABNORMAL LOW (ref 39.0–52.0)
Hemoglobin: 7.4 g/dL — ABNORMAL LOW (ref 13.0–17.0)
Immature Granulocytes: 0 %
Lymphocytes Relative: 20 %
Lymphs Abs: 0.9 10*3/uL (ref 0.7–4.0)
MCH: 33 pg (ref 26.0–34.0)
MCHC: 33.5 g/dL (ref 30.0–36.0)
MCV: 98.7 fL (ref 80.0–100.0)
Monocytes Absolute: 0.4 10*3/uL (ref 0.1–1.0)
Monocytes Relative: 9 %
Neutro Abs: 2.9 10*3/uL (ref 1.7–7.7)
Neutrophils Relative %: 65 %
Platelets: 103 10*3/uL — ABNORMAL LOW (ref 150–400)
RBC: 2.24 MIL/uL — ABNORMAL LOW (ref 4.22–5.81)
RDW: 15.5 % (ref 11.5–15.5)
WBC: 4.5 10*3/uL (ref 4.0–10.5)
nRBC: 0 % (ref 0.0–0.2)

## 2020-12-16 LAB — PHOSPHORUS: Phosphorus: 4.2 mg/dL (ref 2.5–4.6)

## 2020-12-16 LAB — RENAL FUNCTION PANEL
Albumin: 3.2 g/dL — ABNORMAL LOW (ref 3.5–5.0)
Anion gap: 9 (ref 5–15)
BUN: 32 mg/dL — ABNORMAL HIGH (ref 8–23)
CO2: 29 mmol/L (ref 22–32)
Calcium: 8.7 mg/dL — ABNORMAL LOW (ref 8.9–10.3)
Chloride: 98 mmol/L (ref 98–111)
Creatinine, Ser: 6.04 mg/dL — ABNORMAL HIGH (ref 0.61–1.24)
GFR, Estimated: 10 mL/min — ABNORMAL LOW (ref >60)
Glucose, Bld: 128 mg/dL — ABNORMAL HIGH (ref 70–99)
Phosphorus: 4.3 mg/dL (ref 2.5–4.6)
Potassium: 5 mmol/L (ref 3.5–5.1)
Sodium: 136 mmol/L (ref 135–145)

## 2020-12-16 LAB — IRON AND TIBC
Iron: 72 ug/dL (ref 45–182)
Saturation Ratios: 28 % (ref 17.9–39.5)
TIBC: 262 ug/dL (ref 250–450)
UIBC: 190 ug/dL

## 2020-12-16 LAB — FOLATE: Folate: 6.7 ng/mL (ref >5.9)

## 2020-12-16 LAB — GLUCOSE, CAPILLARY
Glucose-Capillary: 125 mg/dL — ABNORMAL HIGH (ref 70–99)
Glucose-Capillary: 145 mg/dL — ABNORMAL HIGH (ref 70–99)
Glucose-Capillary: 69 mg/dL — ABNORMAL LOW (ref 70–99)
Glucose-Capillary: 178 mg/dL — ABNORMAL HIGH (ref 70–99)
Glucose-Capillary: 153 mg/dL — ABNORMAL HIGH (ref 70–99)

## 2020-12-16 LAB — VITAMIN B12: Vitamin B-12: 304 pg/mL (ref 180–914)

## 2020-12-16 LAB — HEMOGLOBIN A1C
Hgb A1c MFr Bld: 5.9 % — ABNORMAL HIGH (ref 4.8–5.6)
Mean Plasma Glucose: 122.63 mg/dL

## 2020-12-16 LAB — BRAIN NATRIURETIC PEPTIDE: B Natriuretic Peptide: 1991.6 pg/mL — ABNORMAL HIGH (ref 0.0–100.0)

## 2020-12-16 LAB — FERRITIN: Ferritin: 1038 ng/mL — ABNORMAL HIGH (ref 24–336)

## 2020-12-16 MED ORDER — ACETAMINOPHEN 325 MG PO TABS
650.0000 mg | ORAL_TABLET | Freq: Four times a day (QID) | ORAL | Status: DC | PRN
  Administered 2020-12-16 – 2020-12-17 (×3): 650 mg via ORAL
  Filled 2020-12-16 (×3): qty 2

## 2020-12-16 MED ORDER — INSULIN PUMP
SUBCUTANEOUS | Status: DC
  Administered 2020-12-20: 5 via SUBCUTANEOUS
  Administered 2020-12-21 (×2): 4 via SUBCUTANEOUS
  Filled 2020-12-16: qty 1

## 2020-12-16 NOTE — Consult Note (Signed)
Cardiology Consultation:   Patient ID: Kenneth [name] MRN: [id number]; DOB: 07-11-1956  Admit date: 12/15/2020 Date of Consult: 12/16/2020  PCP:  [name], MD   [place]  Cardiologist:  No primary care provider on file.  Advanced Practice Provider:  No care team member to display Electrophysiologist:  None     Patient Profile:   Kenneth [name] is a 65 y.o. male with a hx of HTN, HLD, DMII, Afib, ESRD on HD, CAD s/p CABG who is being seen today for the evaluation of bradycardia at the request of Dr. Horris Latino.  History of Present Illness:   Kenneth [name] is a 65 year old male with history detailed above followed by Dr. Marigene Ehlers as an out-patient. He was admitted in 05/2016 with NSTEMI.  LHC showed 3VD and he had CABG x 3 with post-op course complicated by cardiogenic shock requiring milrinone and atrial fibrillation.  He had CKD stage IV when he was admitted and developed ESRD requiring HD while in the hospital. Echo in 05/2016 showed EF 35-40%.  He was admitted in 06/2017 with NSTEMI found to have 95% ostial SVG-OM1 stenosis with TIMI 2 flow in graft s/p DES to SVG-OM1. Again admitted in 03/2018 with NSTEMI with severe in-stent restenosis in SVG-OM1, had DES x 2 to native OM1. Echo showed EF 50%.  Atrial fibrillation was noted during admission. He was readmitted is 04/2019 with severe episode of chest pain.  Hs-TnI was mildly elevated without trend.  Cath was done, showing no target for PCI and stable disease. Echo was done showing EF 50-55% with moderate LVH and normal RV. Last saw Lyda Jester in HF clinic on 11/28/20. TTE at that time with LVEF 55-60%, moderate LVH, G2DD. He was euvolemic. No changes to meds at that time.  He now presents to [facility] hospital with chest tightness and weakness found to be bradycardic to the 40s with poor responsiveness per EMS evaluation. Was given atropine '1mg'$  with improvement. K on arrival 6.2 and he subsequently underwent HD with  improvement. He was also notably anemic with hemoglobin 6.8 with positive FOBT. Apixaban held and GI consulted.   Currently, the patient complains of cough, continued chest tightness and shoulder pain. Trop only mildly elevated at 48. ECG with junctional rhythm with occasional sinus beats, LVH, HR 56.   Past Medical History:  Diagnosis Date  . Acute pulmonary edema ([place]) 05/31/2016  . Acute respiratory failure with hypoxemia ([place]) 05/31/2016  . CAD (coronary artery disease) 10/30/2016   NSTEMI 9/17 with CABG x 3 (LIMA-LAD, SVG-OM, SVG-RCA).   - NSTEMI 10/18 s/p DES to ostial SVG to  OM  . Depression   . Diabetic foot ulcer ([place]) 04/11/2017  . 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 ESRD (end stage renal disease) on dialysis [facility])    "MWF; [place]" (03/30/2018)  . Gastroesophageal reflux disease 08/18/2016  . HCAP (healthcare-associated pneumonia)   . Hemodialysis status ([place])   . Hyperlipidemia   . Hypertension   . Hypertensive heart disease 09/12/2017  . Ischemic rest pain of lower extremity 02/06/2015  . 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 NSTEMI (non-ST elevated myocardial infarction) ([place])   . PAF (paroxysmal atrial fibrillation) ([place])   . Pleural effusion   . Pronation deformity of both feet 02/27/2015    Past Surgical History:  Procedure Laterality Date  . AV FISTULA PLACEMENT Left 06/22/2016   Procedure: ARTERIOVENOUS (AV) FISTULA CREATION LEFT  UPPER ARM;  Surgeon: Rosetta Posner, MD;  Location: [place];  Service: Vascular;  Laterality: Left;  . CARDIAC CATHETERIZATION N/A 05/31/2016   Procedure: Left Heart Cath and Coronary Angiography;  Surgeon: Jettie Booze, MD;  Location: [place] CV LAB;  Service: Cardiovascular;  Laterality: N/A;  . CARDIAC CATHETERIZATION N/A 05/31/2016   Procedure: Right Heart Cath;  Surgeon: Jettie Booze, MD;  Location: [place] CV LAB;  Service: Cardiovascular;  Laterality: N/A;  . CARDIAC  CATHETERIZATION N/A 05/31/2016   Procedure: IABP Insertion;  Surgeon: Jettie Booze, MD;  Location: [place] CV LAB;  Service: Cardiovascular;  Laterality: N/A;  . CORONARY ARTERY BYPASS GRAFT N/A 06/05/2016   Procedure: CORONARY ARTERY BYPASS GRAFTING (CABG) x3 LIMA to LAD -SVG to OM -SVG to RCA;  Surgeon: Ivin Poot, MD;  Location: [place];  Service: Open Heart Surgery;  Laterality: N/A;  . CORONARY STENT INTERVENTION N/A 07/07/2017   Procedure: CORONARY STENT INTERVENTION;  Surgeon: Wellington Hampshire, MD;  Location: [place] CV LAB;  Service: Cardiovascular;  Laterality: N/A;  . CORONARY STENT INTERVENTION N/A 03/30/2018   Procedure: CORONARY STENT INTERVENTION;  Surgeon: Martinique, Peter M, MD;  Location: [place] CV LAB;  Service: Cardiovascular;  Laterality: N/A;  . INSERTION OF DIALYSIS CATHETER N/A 06/05/2016   Procedure: INSERTION OF DIALYSIS/trialysis CATHETER;  Surgeon: Ivin Poot, MD;  Location: [place];  Service: Vascular;  Laterality: N/A;  . INSERTION OF DIALYSIS CATHETER Right 06/13/2016   Procedure: INSERTION OF DIALYSIS CATHETER RIGHT INTERNAL JUGULAR;  Surgeon: Conrad Folsom, MD;  Location: [place];  Service: Vascular;  Laterality: Right;  . INSERTION OF DIALYSIS CATHETER Right 09/07/2019   Procedure: Insertion Of Dialysis Catheter;  Surgeon: Rosetta Posner, MD;  Location: [facility] OR;  Service: Vascular;  Laterality: Right;  . INTRAOPERATIVE TRANSESOPHAGEAL ECHOCARDIOGRAM N/A 06/05/2016   Procedure: INTRAOPERATIVE TRANSESOPHAGEAL ECHOCARDIOGRAM;  Surgeon: Ivin Poot, MD;  Location: [place];  Service: Open Heart Surgery;  Laterality: N/A;  . LEFT HEART CATH AND CORS/GRAFTS ANGIOGRAPHY N/A 11/03/2016   Procedure: Left Heart Cath and Cors/Grafts Angiography;  Surgeon: Troy Sine, MD;  Location: [place] CV LAB;  Service: Cardiovascular;  Laterality: N/A;  . LEFT HEART CATH AND CORS/GRAFTS ANGIOGRAPHY N/A 07/07/2017   Procedure: LEFT HEART CATH AND CORS/GRAFTS  ANGIOGRAPHY;  Surgeon: Larey Dresser, MD;  Location: [place] CV LAB;  Service: Cardiovascular;  Laterality: N/A;  . LEFT HEART CATH AND CORS/GRAFTS ANGIOGRAPHY N/A 03/30/2018   Procedure: LEFT HEART CATH AND CORS/GRAFTS ANGIOGRAPHY;  Surgeon: Martinique, Peter M, MD;  Location: [place] CV LAB;  Service: Cardiovascular;  Laterality: N/A;  . LEFT HEART CATH AND CORS/GRAFTS ANGIOGRAPHY N/A 04/17/2019   Procedure: LEFT HEART CATH AND CORS/GRAFTS ANGIOGRAPHY;  Surgeon: Larey Dresser, MD;  Location: [place] CV LAB;  Service: Cardiovascular;  Laterality: N/A;  . REVISION OF ARTERIOVENOUS GORETEX GRAFT Left [id number]   Procedure: PLICATION OF ANEURYSM OF ARTERIOVENOUS FISTULA  LEFT ARM;  Surgeon: Rosetta Posner, MD;  Location: [place];  Service: Vascular;  Laterality: Left;  . REVISON OF ARTERIOVENOUS FISTULA Left 09/07/2019   Procedure: REVISON OF LEFT UPPER ARM ARTERIOVENOUS FISTULA;  Surgeon: Rosetta Posner, MD;  Location: [facility] OR;  Service: Vascular;  Laterality: Left;     Home Medications:  Prior to Admission medications   Medication Sig Start Date End Date Taking? Authorizing Provider  acetaminophen (TYLENOL) 500 MG tablet Take 1,000 mg by mouth every 6 (six)  hours as needed for mild pain or headache.    Yes [provider]  amLODipine (NORVASC) 10 MG tablet Take 10 mg by mouth at bedtime.    Yes [provider]  Ascorbic Acid (VITAMIN C) 1000 MG tablet Take 1,000 mg by mouth daily.   Yes [provider]  atorvastatin (LIPITOR) 80 MG tablet Take 1 tablet by mouth once daily Patient taking differently: Take 80 mg by mouth daily before lunch. 12/22/19  Yes Larey Dresser, MD  calcium acetate (PHOSLO) 667 MG capsule Take 667 mg by mouth 3 (three) times daily with meals.  07/09/17  Yes [provider]  citalopram (CELEXA) 20 MG tablet Take 20 mg by mouth daily with lunch.  02/25/17  Yes [provider]  ELIQUIS 5 MG TABS tablet Take 1 tablet by mouth  twice daily 02/06/20  Yes Larey Dresser, MD  ezetimibe (ZETIA) 10 MG tablet Take 1 tablet by mouth once daily 02/06/20  Yes Larey Dresser, MD  gabapentin (NEURONTIN) 100 MG capsule Take 100 mg by mouth daily with lunch.  02/11/17  Yes [provider]  hydrALAZINE (APRESOLINE) 25 MG tablet TAKE 1 TABLET BY MOUTH EVERY 8 HOURS Patient taking differently: Take 25 mg by mouth every 8 (eight) hours. 12/13/20  Yes Larey Dresser, MD  Insulin Human (INSULIN PUMP) SOLN Inject into the skin continuous. NOVOLOG   Yes [provider]  isosorbide mononitrate (IMDUR) 60 MG 24 hr tablet Take 1 tablet by mouth once daily 05/27/20  Yes Larey Dresser, MD  losartan (COZAAR) 100 MG tablet Take 100 mg by mouth at bedtime.    Yes [provider]  NOVOLOG 100 UNIT/ML injection Inject 6-10 Units into the skin See admin instructions. Starts with 10 units per day and adds 6 units with each meal via pump 01/08/19  Yes [provider]  pantoprazole (PROTONIX) 40 MG tablet Take 40 mg by mouth daily before lunch.  01/07/17  Yes [provider]    Inpatient Medications: Scheduled Meds: . Chlorhexidine Gluconate Cloth  6 each Topical Q0600  . cinacalcet  60 mg Oral Q M,W,F  . darbepoetin (ARANESP) injection - DIALYSIS  60 mcg Intravenous Q Mon-HD  . insulin aspart  5 Units Intravenous Once  . insulin pump   Subcutaneous Q4H  . pantoprazole (PROTONIX) IV  40 mg Intravenous Q12H   Continuous Infusions: . sodium chloride     PRN Meds: acetaminophen  Allergies:   No Known Allergies  Social History:   Social History   Socioeconomic History  . Marital status: Married    Spouse name: Not on file  . Number of children: Not on file  . Years of education: Not on file  . Highest education level: Not on file  Occupational History  . Not on file  Tobacco Use  . Smoking status: Former Smoker    Packs/day: 1.50    Years: 35.00    Pack years: 52.50    Types: Cigarettes     Quit date: 2014    Years since quitting: 8.2  . Smokeless tobacco: Never Used  . Tobacco comment: "quit e-cigs ~ 2014"  Vaping Use  . Vaping Use: Former  Substance and Sexual Activity  . Alcohol use: Never    Alcohol/week: 0.0 standard drinks  . Drug use: Never  . Sexual activity: Not on file  Other Topics Concern  . Not on file  Social History Narrative  . Not on file  Social Determinants of Health   Financial Resource Strain: Not on file  Food Insecurity: Not on file  Transportation Needs: Not on file  Physical Activity: Not on file  Stress: Not on file  Social Connections: Not on file  Intimate Partner Violence: Not on file    Family History:    Family History  Problem Relation Age of Onset  . CAD Father   . Colon cancer Father   . Diabetes Brother      ROS:  Please see the history of present illness.  Review of Systems  Constitutional: Positive for malaise/fatigue. Negative for chills and fever.  HENT: Negative for hearing loss.   Eyes: Negative for blurred vision and redness.  Respiratory: Positive for cough.   Cardiovascular: Positive for chest pain. Negative for palpitations, orthopnea, claudication, leg swelling and PND.  Gastrointestinal: Negative for nausea and vomiting.  Genitourinary: Negative for dysuria.  Musculoskeletal: Negative for falls.  Neurological: Positive for dizziness. Negative for loss of consciousness.  Endo/Heme/Allergies: Negative for polydipsia.  Psychiatric/Behavioral: Negative for substance abuse.    Physical Exam/Data:   Vitals:   12/16/20 0051 12/16/20 0817 12/16/20 1155 12/16/20 1713  BP: (!) 170/58 118/60 140/62 (!) 150/54  Pulse: 67 68 73 61  Resp: '18 20 20 20  '$ Temp: 98.4 F (36.9 C) 98 F (36.7 C) 98 F (36.7 C) 98 F (36.7 C)  TempSrc:      SpO2: 98% 96% 95% 97%    Intake/Output Summary (Last 24 hours) at 12/16/2020 1725 Last data filed at 12/16/2020 0810 Gross per 24 hour  Intake 30 ml  Output 1000 ml   Net -970 ml   Last 3 Weights 11/28/2020 10/31/2020 01/23/2020  Weight (lbs) 133 lb 137 lb 12.8 oz 137 lb 9.6 oz  Weight (kg) 60.328 kg 62.506 kg 62.415 kg     There is no height or weight on file to calculate BMI.  General:  Well nourished, well developed, in no acute distress HEENT: normal Neck: no JVD Endocrine:  No thryomegaly Vascular: No carotid bruits; FA pulses 2+ bilaterally without bruits  Cardiac:  normal S1, S2; RRR; 1/6 systolic murmur Lungs:  clear to auscultation bilaterally, no wheezing, rhonchi or rales  Abd: soft, nontender, no hepatomegaly  Ext: no edema Musculoskeletal:  No deformities, BUE and BLE strength normal and equal Skin: warm and dry  Neuro:  CNs 2-12 intact, no focal abnormalities noted Psych:  Normal affect   EKG:  The EKG was personally reviewed and demonstrates:  NSR with prior episodes of junctional rhythm Telemetry:  Telemetry was personally reviewed and demonstrates:  Junctional rhythm with intermittent sinus beats  Relevant CV Studies: TTE 11/28/20:  1. Left ventricular ejection fraction, by estimation, is 55 to 60%. The  left ventricle has normal function. The left ventricle has no regional  wall motion abnormalities. There is moderate left ventricular hypertrophy.  Left ventricular diastolic  parameters are consistent with Grade II diastolic dysfunction  (pseudonormalization). Elevated left atrial pressure.  2. Right ventricular systolic function is normal. The right ventricular  size is normal.  3. The mitral valve is normal in structure. Trivial mitral valve  regurgitation. No evidence of mitral stenosis.  4. The aortic valve is tricuspid. Aortic valve regurgitation is trivial.  Mild aortic valve sclerosis is present, with no evidence of aortic valve  stenosis.  5. The inferior vena cava is normal in size with greater than 50%  respiratory variability, suggesting right atrial pressure of 3 mmHg.   Cath  04/17/2019: 1. Occluded SVG-RCA  and SVG-ramus, patent LIMA-LAD.  2. The large ramus was patent with 40-50% mid ramus stenosis (in-stent restenosis).  3. 80% ostial stenosis very small caliber AV LCx.  Not interventional target.  4. Occluded RCA (known from prior) with collaterals.   No target for intervention.  Aggressive medical management of angina.   Myoview 11/15/18:  Baseline EKG showed NSR with LVH and repolarization abnormality in the inferolateral leads. No change in ST segments during Lexiscan infusion.  Nuclear stress EF: 54%.  The left ventricular ejection fraction is mildly decreased (45-54%).  There is a small defect of mild severity present in the basal inferior and mid inferior location. The defect is non-reversible and in the setting of normal LVF is consistent with diaphragmatic attenuation artifact. No ischemia noted.  This is a low risk study.    Laboratory Data:  High Sensitivity Troponin:   Recent Labs  Lab 12/15/20 1636 12/16/20 0406 12/16/20 0627  TROPONINIHS 44* 45* 48*     Chemistry Recent Labs  Lab 12/15/20 1636 12/15/20 1707 12/15/20 1708 12/16/20 0857  NA 132* 131* 131* 136  K 6.2* 6.2* 6.2* 5.0  CL 94* 96*  --  98  CO2 26  --   --  29  GLUCOSE 345* 329*  --  128*  BUN 67* 75*  --  32*  CREATININE 9.40* 9.70*  --  6.04*  CALCIUM 7.8*  --   --  8.7*  GFRNONAA 6*  --   --  10*  ANIONGAP 12  --   --  9    Recent Labs  Lab 12/15/20 1636 12/16/20 0857  PROT 5.7*  --   ALBUMIN 3.3* 3.2*  AST 18  --   ALT 17  --   ALKPHOS 82  --   BILITOT 0.6  --    Hematology Recent Labs  Lab 12/15/20 1636 12/15/20 1707 12/15/20 1708 12/16/20 0857  WBC 4.5  --   --  4.5  RBC 2.17*  --   --  2.24*  HGB 7.1* 6.8* 6.8* 7.4*  HCT 21.9* 20.0* 20.0* 22.1*  MCV 100.9*  --   --  98.7  MCH 32.7  --   --  33.0  MCHC 32.4  --   --  33.5  RDW 15.5  --   --  15.5  PLT 97*  --   --  103*   BNP Recent Labs  Lab 12/16/20 0406  BNP 1,991.6*    DDimer No results for input(s):  DDIMER in the last 168 hours.   Radiology/Studies:  DG Chest Portable 1 View  Result Date: 12/15/2020 CLINICAL DATA:  Chest pain. EXAM: PORTABLE CHEST 1 VIEW COMPARISON:  September 07, 2019 FINDINGS: The heart, hila, and mediastinum are normal. No pneumothorax. No nodules or masses. No suspicious infiltrates or overt edema. IMPRESSION: No active disease. Electronically Signed   By: Dorise Bullion III M.D   On: 12/15/2020 17:41     Assessment and Plan:   #Symptomatic Bradycardia: Patient with symptomatic bradycardia with HR 40s. ECG from today with junctional rhythm with intermittent sinus conduction. Previously with sinus brady, RBBB, borderline 1st degree AVB. Now improved with holding BB with HR 60s. Will monitor off medication and if recurs, would need PPM at that time. -Stop BB -Monitor off BB and if recurrence, would need PPM at that time  #CAD s/p CABG with multiple subsequent PCIs and caths: S/p CABG in 2017.  NSTEMI 10/18, cath with 95% ostial  SVG-ramus stenosis with TIMI 2 flow in graft, s/p DES to SVG-ramus 07/07/17. NSTEMI 7/19 with in-stent restenosis in the SVG-ramus, had DES x 2 to native ramus.   He has had chronic atypical chest pain episodes, Cardiolite in 3/20 showed no ischemia.  He had repeat cath in 8/20 showing occluded SVG-ramus (new) but continued patency of the native ramus s/p stenting. No target for intervention. On medical management. Do not suspect ACS as etiology of chest tightness and patient's trop minimally elevated. Would treat bradycardia as above and continue to monitor -Low suspicion for ACS at this time; continue management of bradycardia as above -Trop flat -Not on ASA due to need for eliquis  -Continue imdur '60mg'$  daily -Continue lipitor '80mg'$  daily -Continue losartan '100mg'$  daily -Off metop due to bradycardia as above  #Chronic Diastolic Heart Failure: LVEF 55-60%, moderate LVH, G2DD. Does not appear acutely decompensated. -Continue volume manangement  with HD -Hold metop due to symptomatic bradycardia -Holding losartan per primary; can resume once more clinically stable -Followed by HF clinic  #Anemia with concern for GIB: HgB 6.8 from baseline 10. -Holding eliquis -GI consulted, appreciate recommendations  #Atrial Fibrillation: CHADs-vasc 4 -Holding eliquis given concern for GIB -Hold BB  #HTN: -Currently being held due to concern for GIB and recent bradycardia episode; resume once more clinically stable  #HLD: LDL 52. -Continue atorvastatin '80mg'$  daily and zetia '10mg'$  daily  #Carotid Stenosis: Mild on recent carotid ultrasound. -Continue statin and zetia as above  #ESRD: -Continue scheduled HD  #DMII: -On insulin   Risk Assessment/Risk Scores:     HEAR Score (for undifferentiated chest pain):     New York Heart Association (NYHA) Functional Class NYHA Class II  CHA2DS2-VASc Score = 4  This indicates a 4.8% annual risk of stroke. [name] score is based upon: CHF History: Yes HTN History: Yes Diabetes History: Yes Stroke History: No Vascular Disease History: Yes Age Score: 0 Gender Score: 0        For questions or updates, please contact [place] Please consult [email or web address] for contact info under    Signed, Freada Bergeron, MD  12/16/2020 5:25 PM

## 2020-12-16 NOTE — Progress Notes (Addendum)
Inpatient Diabetes Program Recommendations  AACE/ADA: New Consensus Statement on Inpatient Glycemic Control (2015)  Target Ranges:  Prepandial:   less than 140 mg/dL      Peak postprandial:   less than 180 mg/dL (1-2 hours)      Critically ill patients:  140 - 180 mg/dL   Results for Kenneth [name], Kenneth [name] (MRN [id number]) as of 12/16/2020 09:21  Ref. Range 12/15/2020 16:36  Glucose Latest Ref Range: 70 - 99 mg/dL 345 (H)  5 units NOVOLOG IV   Results for Kenneth [name], Kenneth [name] (MRN [id number]) as of 12/16/2020 12:42  Ref. Range 12/16/2020 09:30 12/16/2020 11:52  Glucose-Capillary Latest Ref Range: 70 - 99 mg/dL 125 (H) 145 (H)   Admit with: Bradycardia  History: Type 1 Diabetes, ESRD  Home DM Meds: Insulin Pump  Current Orders: Insulin Pump Q4 hours   Endocrinologist: Dr. Hartford Poli with [facility]--Last seen in office 09/19/2020 All of pt's Basal rates were reduced by 0.05 units/hr at that visit    MD- If pt is currently allowed to use his home insulin pump in the hospital, please enter the Insulin Pump Order set   Addendum 12:10pm--Met w/ pt and wife at bedside using [employer] interpreter #[id number] ([place]).  Pt told me she took pt's Insulin pump off of him sometime last night.  CBG at 12pm was 145.  Asked pt and his wife to please re-attach and restart the Insulin pump while I was in the room since Dr. Horris Latino had placed the Insulin Pump orders this AM.  Wife and pt together re-attached and restarted the pt's Insulin pump.  I looked at the screen with the pt and asked him if everything looked like it was on and running and he stated yes.  Wife to bring extra pump supplies to hospital tomorrow 04/05 so pt can change set/site/reservoir, etc.  Pt is current with Dr. Hartford Poli (Endocrinology).  I reminded pt and wife that the hospital staff will check his CBGs with the hospital meter and I asked pt to please let his RN know when and how much insulin he gives himself when he does so.  Pt agreeable.  Made RN aware that pt resumed  insulin pump and reviewed charting needs with RN as well.  I also sent Dr. Horris Latino a secure chat letting her know the above info as well.  Reviewed Insulin Pump settings with both pt and wife: Total Basal per 24 hour period= 11.6 units Correction Factor= 1 unit for every 60 mg/dl above Target CBG Carbohydrate Ratio= 1 unit for every 12 grams Carbohydrates consumed Target CBG 100-120 mg/dl     --Will follow patient during hospitalization--  Wyn Quaker RN, MSN, CDE Diabetes Coordinator Inpatient Glycemic Control Team Team Pager: [phone] (8a-5p)

## 2020-12-16 NOTE — Progress Notes (Signed)
[facility] Progress Note  Subjective: no c/o today  Vitals:   12/16/20 0048 12/16/20 0051 12/16/20 0817 12/16/20 1155  BP: (!) 148/59 (!) 170/58 118/60 140/62  Pulse: 66 67 68 73  Resp: '18 18 20 20  '$ Temp: 98.4 F (36.9 C) 98.4 F (36.9 C) 98 F (36.7 C) 98 F (36.7 C)  TempSrc: Oral     SpO2: 96% 98% 96% 95%    Exam:   alert, nad   no jvd  Chest occ crackles bilat bases  Cor reg no RG  Abd soft ntnd no ascites   Ext no LE edema   Alert, NF, ox3    AVF+bruit    OP HD: MWF AF  3h 14mn  400/500   59.5kg  2/2 bath  AVF     Assessment/ Plan: 1. Symptomatic bradycardia:  2. CAD/ atrial fib - eliquis on hold for anemia 3. Anemia - r/o GIB, GI consulting. Hb 7.4 today.   4. ESRD: usual HD MWF. Had HD for ^K+ last night, then short HD today to get back on schedule.  5. Volume/ hypertension: EDW 59.5kg. Attempt to achieve EDW as tolerated 6. Anemia of Chronic Kidney Disease: Hemoglobin 6.8 was 8.6 on 3/30 concerning for possible blood loss. Transfusion tonight. Currently receiving mircera 55m q 4wks due tomorrow. Will give aranesp 6037mweekly. Hold weekly iron given transfusion 7. Secondary Hyperparathyroidism/Hyperphosphatemia: Sensipar 46m54m/ dialysis. Check phos  8. Vascular access: AVF with significant pulsatility and prolonged bleeding. Being evaluated outpt per patient       Rob Kelly Splinter/2022, 2:13 PM   Recent Labs  Lab 12/15/20 1636 12/15/20 1707 12/15/20 1708 12/16/20 0406 12/16/20 0857  K 6.2* 6.2* 6.2*  --  5.0  BUN 67* 75*  --   --  32*  CREATININE 9.40* 9.70*  --   --  6.04*  CALCIUM 7.8*  --   --   --  8.7*  PHOS  --   --   --  4.2 4.3  HGB 7.1* 6.8* 6.8*  --  7.4*   Inpatient medications: . Chlorhexidine Gluconate Cloth  6 each Topical Q0600  . cinacalcet  60 mg Oral Q M,W,F  . darbepoetin (ARANESP) injection - DIALYSIS  60 mcg Intravenous Q Mon-HD  . insulin aspart  5 Units Intravenous Once  . insulin pump    Subcutaneous Q4H  . pantoprazole (PROTONIX) IV  40 mg Intravenous Q12H   . sodium chloride     acetaminophen

## 2020-12-16 NOTE — Plan of Care (Signed)

## 2020-12-16 NOTE — Progress Notes (Signed)
PROGRESS NOTE  Kenneth [name] [name] DOB: 11-Feb-1956 DOA: 12/15/2020 PCP: [name], MD  HPI/Recap of past 24 hours: HPI from Dr Magdalene River is a 65 y.o. male with medical history significant of HLD, HTN, DM1, A.fib, ESRD on HD, CAD sp CABG presented with CP tightness and weakness, took 2 nitro's at home. On EMS arrival bradycardic 40's and poorly responsive, given atropine 1 mg, HR up to 40-60's. Has not been feeling well for the past 1 wk, reports blood in stool intermittently. No black stools. Last HD was on Friday as scheduled. No fever no n/V/D. In the ED, K 6.2, EKG showed junctional rhythm, pt was given Lokelma glucose and insulin.  Nephrology consulted and subsequently had HD. Noted to have hemoglobin drop 2 units, with hemoccult positive stools.  GI consulted.  Patient admitted for further management.    Today, patient denies any new complaints, still reporting generalized weakness with some non-reproducible L sided discomfort. Pt denies any nausea/vomiting, fever/chills.  Patient reports some headache.   Assessment/Plan: Active Problems:   ESRD (end stage renal disease) (HCC)   CAD (coronary artery disease)   Chest pain   Gastroesophageal reflux disease   Chronic diastolic CHF (congestive heart failure) (HCC)   Hyperkalemia   Hyperlipidemia   AF (paroxysmal atrial fibrillation) (HCC)   Symptomatic bradycardia   Postural dizziness with presyncope   Symptomatic anemia   Blood in stool   Symptomatic bradycardia S/p atropine in the ED, HR currently in the 60-70s EKG with no acute ST changes Echo done on 11/2020 showed EF of 55 to 123456, grade 2 diastolic dysfunction Continue to hold home metoprolol Cardiology consulted due to extensive cardiac history, and plans to ?restart BB Telemetry  Extensive history of CAD s/p CABG Chronic diastolic HF Hyperlipidemia Complaining of substernal chest pain Troponin with a flat trend, will repeat BNP 1991 EKG with  no acute ST changes Echo as above Continue to hold metoprolol, imdur, losartan Continue Lipitor, Zetia Cardiology consulted, appreciate further recs Telemetry  Possible GI bleed Anemia of CKD Baseline hemoglobin around 9-10 Reported intermittent blood in stool, FOBT positive Transfuse if hemoglobin less than 7 Anemia panel pending Hold home Eliquis PPI twice daily GI consulted, appreciate recs Daily CBC  ESRD Hyperkalemia Compliant with HD, M/W/F Nephrology consulted  Diabetes mellitus type 1 Last A1c 08/2019 was 6.8, repeat pending Continue home insulin pump, SSI, Accu-Cheks, hypoglycemic protocol  Hypertension Hold metoprolol, Norvasc, losartan, hydralazine for now  Paroxysmal A. Fib Currently in sinus rhythm Hold metoprolol due to bradycardia, hold Eliquis due to possible GI bleed Telemetry  Generalized weakness/deconditioned PT/OT         Estimated body mass index is 20.83 kg/m as calculated from the following:   Height as of 10/03/19: '5\' 7"'$  (1.702 m).   Weight as of 11/28/20: 60.3 kg.     Code Status: Full  Family Communication: Discussed with patient, none at bedside  Disposition Plan: Status is: Inpatient  Remains inpatient appropriate because:Inpatient level of care appropriate due to severity of illness   Dispo: The patient is from: Home              Anticipated d/c is to: Home              Patient currently is not medically stable to d/c.   Difficult to place patient No    Consultants:  Nephrology  GI  Cardiology    Procedures:  None  Antimicrobials:  None  DVT prophylaxis: SCDs, held Eliquis   Objective: Vitals:   12/16/20 0048 12/16/20 0051 12/16/20 0817 12/16/20 1155  BP: (!) 148/59 (!) 170/58 118/60 140/62  Pulse: 66 67 68 73  Resp: '18 18 20 20  '$ Temp: 98.4 F (36.9 C) 98.4 F (36.9 C) 98 F (36.7 C) 98 F (36.7 C)  TempSrc: Oral     SpO2: 96% 98% 96% 95%    Intake/Output Summary (Last 24 hours) at  12/16/2020 1314 Last data filed at 12/16/2020 0359 Gross per 24 hour  Intake 0 ml  Output 1000 ml  Net -1000 ml   There were no vitals filed for this visit.  Exam:  General: NAD, appears deconditioned, chronically ill-appearing  Cardiovascular: S1, S2 present  Respiratory: CTAB  Abdomen: Soft, nontender, nondistended, bowel sounds present, insulin pump present  Musculoskeletal: No bilateral pedal edema noted  Skin: Normal  Psychiatry: Normal mood    Data Reviewed: CBC: Recent Labs  Lab 12/15/20 1636 12/15/20 1707 12/15/20 1708 12/16/20 0857  WBC 4.5  --   --  4.5  NEUTROABS 2.8  --   --  2.9  HGB 7.1* 6.8* 6.8* 7.4*  HCT 21.9* 20.0* 20.0* 22.1*  MCV 100.9*  --   --  98.7  PLT 97*  --   --  [id number]*   Basic Metabolic Panel: Recent Labs  Lab 12/15/20 1636 12/15/20 1707 12/15/20 1708 12/16/20 0406 12/16/20 0857  NA 132* 131* 131*  --  136  K 6.2* 6.2* 6.2*  --  5.0  CL 94* 96*  --   --  98  CO2 26  --   --   --  29  GLUCOSE 345* 329*  --   --  128*  BUN 67* 75*  --   --  32*  CREATININE 9.40* 9.70*  --   --  6.04*  CALCIUM 7.8*  --   --   --  8.7*  PHOS  --   --   --  4.2 4.3   GFR: CrCl cannot be calculated (Unknown ideal weight.). Liver Function Tests: Recent Labs  Lab 12/15/20 1636 12/16/20 0857  AST 18  --   ALT 17  --   ALKPHOS 82  --   BILITOT 0.6  --   PROT 5.7*  --   ALBUMIN 3.3* 3.2*   No results for input(s): LIPASE, AMYLASE in the last 168 hours. No results for input(s): AMMONIA in the last 168 hours. Coagulation Profile: Recent Labs  Lab 12/15/20 1636  INR 1.3*   Cardiac Enzymes: No results for input(s): CKTOTAL, CKMB, CKMBINDEX, TROPONINI in the last 168 hours. BNP (last 3 results) No results for input(s): PROBNP in the last 8760 hours. HbA1C: No results for input(s): HGBA1C in the last 72 hours. CBG: Recent Labs  Lab 12/15/20 1959 12/16/20 0930 12/16/20 1152  GLUCAP 154* 125* 145*   Lipid Profile: No results for  input(s): CHOL, HDL, LDLCALC, TRIG, CHOLHDL, LDLDIRECT in the last 72 hours. Thyroid Function Tests: No results for input(s): TSH, T4TOTAL, FREET4, T3FREE, THYROIDAB in the last 72 hours. Anemia Panel: No results for input(s): VITAMINB12, FOLATE, FERRITIN, TIBC, IRON, RETICCTPCT in the last 72 hours. Urine analysis:    Component Value Date/Time   COLORURINE YELLOW 10/30/2016 [place] 10/30/2016 2339   LABSPEC 1.010 10/30/2016 2339   PHURINE 5.0 10/30/2016 [place] 10/30/2016 2339   HGBUR NEGATIVE 10/30/2016 2339   BILIRUBINUR NEGATIVE 10/30/2016 2339   KETONESUR NEGATIVE  10/30/2016 2339   PROTEINUR 100 (A) 10/30/2016 2339   NITRITE NEGATIVE 10/30/2016 2339   LEUKOCYTESUR NEGATIVE 10/30/2016 2339   Sepsis Labs: '@LABRCNTIP'$ (procalcitonin:4,lacticidven:4)  ) Recent Results (from the past 240 hour(s))  Resp Panel by RT-PCR (Flu A&B, Covid) Nasopharyngeal Swab     Status: None   Collection Time: 12/15/20  8:57 PM   Specimen: Nasopharyngeal Swab; Nasopharyngeal(NP) swabs in vial transport medium  Result Value Ref Range Status   SARS Coronavirus 2 by RT PCR NEGATIVE NEGATIVE Final    Comment: (NOTE) SARS-CoV-2 target nucleic acids are NOT DETECTED.  The SARS-CoV-2 RNA is generally detectable in upper respiratory specimens during the acute phase of infection. The lowest concentration of SARS-CoV-2 viral copies this assay can detect is 138 copies/mL. A negative result does not preclude SARS-Cov-2 infection and should not be used as the sole basis for treatment or other patient management decisions. A negative result may occur with  improper specimen collection/handling, submission of specimen other than nasopharyngeal swab, presence of viral mutation(s) within the areas targeted by this assay, and inadequate number of viral copies(<138 copies/mL). A negative result must be combined with clinical observations, patient history, and  epidemiological information. The expected result is Negative.  Fact Sheet for Patients:  EntrepreneurPulse.com.au  Fact Sheet for Healthcare Providers:  IncredibleEmployment.be  This test is no t yet approved or cleared by the [place] FDA and  has been authorized for detection and/or diagnosis of SARS-CoV-2 by FDA under an Emergency Use Authorization (EUA). This EUA will remain  in effect (meaning this test can be used) for the duration of the COVID-19 declaration under Section 564(b)(1) of the Act, 21 U.S.C.section 360bbb-3(b)(1), unless the authorization is terminated  or revoked sooner.       Influenza A by PCR NEGATIVE NEGATIVE Final   Influenza B by PCR NEGATIVE NEGATIVE Final    Comment: (NOTE) The Xpert Xpress SARS-CoV-2/FLU/RSV plus assay is intended as an aid in the diagnosis of influenza from Nasopharyngeal swab specimens and should not be used as a sole basis for treatment. Nasal washings and aspirates are unacceptable for Xpert Xpress SARS-CoV-2/FLU/RSV testing.  Fact Sheet for Patients: EntrepreneurPulse.com.au  Fact Sheet for Healthcare Providers: IncredibleEmployment.be  This test is not yet approved or cleared by the [place] FDA and has been authorized for detection and/or diagnosis of SARS-CoV-2 by FDA under an Emergency Use Authorization (EUA). This EUA will remain in effect (meaning this test can be used) for the duration of the COVID-19 declaration under Section 564(b)(1) of the Act, 21 U.S.C. section 360bbb-3(b)(1), unless the authorization is terminated or revoked.  Performed at [facility], [place] [address] [place], [place] [address]       Studies: DG Chest Portable 1 View  Result Date: 12/15/2020 CLINICAL DATA:  Chest pain. EXAM: PORTABLE CHEST 1 VIEW COMPARISON:  September 07, 2019 FINDINGS: The heart, hila, and mediastinum are normal. No pneumothorax. No  nodules or masses. No suspicious infiltrates or overt edema. IMPRESSION: No active disease. Electronically Signed   By: Dorise Bullion III M.D   On: 12/15/2020 17:41    Scheduled Meds: . Chlorhexidine Gluconate Cloth  6 each Topical Q0600  . cinacalcet  60 mg Oral Q M,W,F  . darbepoetin (ARANESP) injection - DIALYSIS  60 mcg Intravenous Q Mon-HD  . insulin aspart  5 Units Intravenous Once  . insulin pump   Subcutaneous Q4H  . pantoprazole (PROTONIX) IV  40 mg Intravenous Q12H    Continuous Infusions: .  sodium chloride       LOS: 1 day     Alma Friendly, MD [facility]  If 7PM-7AM, please contact night-coverage [email or web address] 12/16/2020, 1:14 PM

## 2020-12-16 NOTE — Progress Notes (Signed)
Troponin 137, Horris Latino, MD paged.

## 2020-12-16 NOTE — Consult Note (Addendum)
Referring Provider: Dr. Lesia Sago  Primary Care Physician:  [name], MD Primary Gastroenterologist:  Althia Forts   Reason for Consultation:  Anemia, positive FOBT  HPI: Kenneth [name] is a 65 y.o. male with a past medical history significant for HLD, HTN, DM II,  A.fib, ESRD on HD, CAD s/p NSTEMI 2017 and 2018, s/p DES 2018 and s/p 3 vessel CABG 2017.   He speaks [place] therefore the [employer] interpreter service was utilized to communicate with the patient throughout this consultation.  His wife is also present and she speaks limited [place].  He developed dizziness and weakness approximately 3 days ago.  He vomited clear nonbloody emesis with chest pain on 12/15/2020.  His wife called EMS and he was found to be bradycardic with heart rate in the 40s. He received Atropine '1mg'$  IV and his HR went up 40 - 60 b/min. K+ 6.2. He received insulin, Dextrose and Calcium.  Admission Hemoglobin level 6.8.  INR 1.3. Troponin 45 - 48. SARS COVID-19 negative.  A GI consult was requested for further evaluation for anemia with positive FOBT.  He denies having any heartburn or dysphagia. No upper or lower abdominal pain.  He typically passes a normal formed brown bowel movement every other day.  His last dose of Eliquis was taken around 1 PM on 12/15/2020.  No NSAID use. He underwent an EGD and colonoscopy 5 or 6 years ago with [facility] which he stated was routine as recommended by his PCP (endoscopic records not found in Care everywhere).  He stated the EGD and colonoscopy were normal.  His father was diagnosed with colon cancer in his 66s.  No weight loss.  No fever, sweats or chills.  No current chest pain, palpitations, dizziness or shortness of breath.  His last hemodialysis was on Sunday, 12/15/2020.   ECHO 11/28/2020:  1. Left ventricular ejection fraction, by estimation, is 55 to 60%. The left ventricle has normal function. The left ventricle has no regional wall motion abnormalities. There is  moderate left ventricular hypertrophy. Left ventricular diastolic parameters are consistent with Grade II diastolic dysfunction (pseudonormalization). Elevated left atrial pressure. 2. Right ventricular systolic function is normal. The right ventricular size is normal. 3. The mitral valve is normal in structure. Trivial mitral valve regurgitation. No evidence of mitral stenosis. 4. The aortic valve is tricuspid. Aortic valve regurgitation is trivial. Mild aortic valve sclerosis is present, with no evidence of aortic valve stenosis. 5. The inferior vena cava is normal in size with greater than 50% respiratory variability, suggesting right atrial pressure of 3 mmHg.  Past Medical History:  Diagnosis Date  . Acute pulmonary edema ([place]) 05/31/2016  . Acute respiratory failure with hypoxemia ([place]) 05/31/2016  . CAD (coronary artery disease) 10/30/2016   NSTEMI 9/17 with CABG x 3 (LIMA-LAD, SVG-OM, SVG-RCA).   - NSTEMI 10/18 s/p DES to ostial SVG to  OM  . Depression   . Diabetic foot ulcer ([place]) 04/11/2017  . 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 ESRD (end stage renal disease) on dialysis [facility])    "MWF; [place]" (03/30/2018)  . Gastroesophageal reflux disease 08/18/2016  . HCAP (healthcare-associated pneumonia)   . Hemodialysis status ([place])   . Hyperlipidemia   . Hypertension   . Hypertensive heart disease 09/12/2017  . Ischemic rest pain of lower extremity 02/06/2015  . 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 NSTEMI (non-ST elevated myocardial infarction) ([place])   . PAF (  paroxysmal atrial fibrillation) ([place])   . Pleural effusion   . Pronation deformity of both feet 02/27/2015    Past Surgical History:  Procedure Laterality Date  . AV FISTULA PLACEMENT Left 06/22/2016   Procedure: ARTERIOVENOUS (AV) FISTULA CREATION LEFT UPPER ARM;  Surgeon: Rosetta Posner, MD;  Location: [place];  Service: Vascular;  Laterality: Left;  . CARDIAC CATHETERIZATION N/A  05/31/2016   Procedure: Left Heart Cath and Coronary Angiography;  Surgeon: Jettie Booze, MD;  Location: [place] CV LAB;  Service: Cardiovascular;  Laterality: N/A;  . CARDIAC CATHETERIZATION N/A 05/31/2016   Procedure: Right Heart Cath;  Surgeon: Jettie Booze, MD;  Location: [place] CV LAB;  Service: Cardiovascular;  Laterality: N/A;  . CARDIAC CATHETERIZATION N/A 05/31/2016   Procedure: IABP Insertion;  Surgeon: Jettie Booze, MD;  Location: [place] CV LAB;  Service: Cardiovascular;  Laterality: N/A;  . CORONARY ARTERY BYPASS GRAFT N/A 06/05/2016   Procedure: CORONARY ARTERY BYPASS GRAFTING (CABG) x3 LIMA to LAD -SVG to OM -SVG to RCA;  Surgeon: Ivin Poot, MD;  Location: [place];  Service: Open Heart Surgery;  Laterality: N/A;  . CORONARY STENT INTERVENTION N/A 07/07/2017   Procedure: CORONARY STENT INTERVENTION;  Surgeon: Wellington Hampshire, MD;  Location: [place] CV LAB;  Service: Cardiovascular;  Laterality: N/A;  . CORONARY STENT INTERVENTION N/A 03/30/2018   Procedure: CORONARY STENT INTERVENTION;  Surgeon: Martinique, Peter M, MD;  Location: [place] CV LAB;  Service: Cardiovascular;  Laterality: N/A;  . INSERTION OF DIALYSIS CATHETER N/A 06/05/2016   Procedure: INSERTION OF DIALYSIS/trialysis CATHETER;  Surgeon: Ivin Poot, MD;  Location: [place];  Service: Vascular;  Laterality: N/A;  . INSERTION OF DIALYSIS CATHETER Right 06/13/2016   Procedure: INSERTION OF DIALYSIS CATHETER RIGHT INTERNAL JUGULAR;  Surgeon: Conrad Holtsville, MD;  Location: [place];  Service: Vascular;  Laterality: Right;  . INSERTION OF DIALYSIS CATHETER Right 09/07/2019   Procedure: Insertion Of Dialysis Catheter;  Surgeon: Rosetta Posner, MD;  Location[facility] OR;  Service: Vascular;  Laterality: Right;  . INTRAOPERATIVE TRANSESOPHAGEAL ECHOCARDIOGRAM N/A 06/05/2016   Procedure: INTRAOPERATIVE TRANSESOPHAGEAL ECHOCARDIOGRAM;  Surgeon: Ivin Poot, MD;  Location: [place];  Service: Open Heart  Surgery;  Laterality: N/A;  . LEFT HEART CATH AND CORS/GRAFTS ANGIOGRAPHY N/A 11/03/2016   Procedure: Left Heart Cath and Cors/Grafts Angiography;  Surgeon: Troy Sine, MD;  Location: [place] CV LAB;  Service: Cardiovascular;  Laterality: N/A;  . LEFT HEART CATH AND CORS/GRAFTS ANGIOGRAPHY N/A 07/07/2017   Procedure: LEFT HEART CATH AND CORS/GRAFTS ANGIOGRAPHY;  Surgeon: Larey Dresser, MD;  Location: [place] CV LAB;  Service: Cardiovascular;  Laterality: N/A;  . LEFT HEART CATH AND CORS/GRAFTS ANGIOGRAPHY N/A 03/30/2018   Procedure: LEFT HEART CATH AND CORS/GRAFTS ANGIOGRAPHY;  Surgeon: Martin[place], Peter M, MD;  Location: [place] CV LAB;  Service: Cardiovascular;  Laterality: N/A;  . LEFT HEART CATH AND CORS/GRAFTS ANGIOGRAPHY N/A 04/17/2019   Procedure: LEFT HEART CATH AND CORS/GRAFTS ANGIOGRAPHY;  Surgeon: Larey Dresser, MD;  Location: [place] CV LAB;  Service: Cardiovascular;  Laterality: N/A;  . REVISION OF ARTERIOVENOUS GORETEX GRAFT Left [id number]   Procedure: PLICATION OF ANEURYSM OF ARTERIOVENOUS FISTULA  LEFT ARM;  Surgeon: Rosetta Posner, MD;  Location: [place];  Service: Vascular;  Laterality: Left;  . REVISON OF ARTERIOVENOUS FISTULA Left 09/07/2019   Procedure: REVISON OF LEFT UPPER ARM ARTERIOVENOUS FISTULA;  Surgeon: Rosetta Posner, MD;  Location: [facility]  OR;  Service: Vascular;  Laterality: Left;    Prior to Admission medications   Medication Sig Start Date End Date Taking? Authorizing Provider  acetaminophen (TYLENOL) 500 MG tablet Take 1,000 mg by mouth every 6 (six) hours as needed for mild pain or headache.     [provider]  amLODipine (NORVASC) 10 MG tablet Take 10 mg by mouth at bedtime.     [provider]  Ascorbic Acid (VITAMIN C) 1000 MG tablet Take 1,000 mg by mouth 3 (three) times daily.     [provider]  atorvastatin (LIPITOR) 80 MG tablet Take 1 tablet by mouth once daily 12/22/19   Larey Dresser, MD  calcium acetate  (PHOSLO) 667 MG capsule Take 667 mg by mouth 3 (three) times daily with meals.  07/09/17   [provider]  citalopram (CELEXA) 20 MG tablet Take 20 mg by mouth daily with lunch.  02/25/17   [provider]  ELIQUIS 5 MG TABS tablet Take 1 tablet by mouth twice daily 02/06/20   Larey Dresser, MD  ezetimibe (ZETIA) 10 MG tablet Take 1 tablet by mouth once daily 02/06/20   Larey Dresser, MD  gabapentin (NEURONTIN) 100 MG capsule Take 100 mg by mouth daily with lunch.  02/11/17   [provider]  hydrALAZINE (APRESOLINE) 25 MG tablet TAKE 1 TABLET BY MOUTH EVERY 8 HOURS 12/13/20   Larey Dresser, MD  Insulin Human (INSULIN PUMP) SOLN Inject into the skin continuous. NOVOLOG    [provider]  isosorbide mononitrate (IMDUR) 60 MG 24 hr tablet Take 1 tablet by mouth once daily 05/27/20   Larey Dresser, MD  losartan (COZAAR) 100 MG tablet Take 100 mg by mouth at bedtime.     [provider]  metoprolol succinate (TOPROL-XL) 25 MG 24 hr tablet TAKE 1 TABLET BY MOUTH AT BEDTIME 07/17/19   Larey Dresser, MD  NOVOLOG 100 UNIT/ML injection Inject 6-10 Units into the skin See admin instructions. Starts with 10 units per day and adds 6 units with each meal via pump 01/08/19   [provider]  oxyCODONE-acetaminophen (PERCOCET) 7.5-325 MG tablet Take 1 tablet by mouth every 4 (four) hours as needed for severe pain.    [provider]  pantoprazole (PROTONIX) 40 MG tablet Take 40 mg by mouth daily before lunch.  01/07/17   [provider]    Current Facility-Administered Medications  Medication Dose Route Frequency Provider Last Rate Last Admin  . 0.9 %  sodium chloride infusion  10 mL/hr Intravenous Once Kugler, [place], MD      . acetaminophen (TYLENOL) tablet 650 mg  650 mg Oral Q6H PRN Alma Friendly, MD   650 mg at 12/16/20 1223  . Chlorhexidine Gluconate Cloth 2 % PADS 6 each  6 each Topical Q0600 Reesa Chew, MD   6  each at 12/16/20 [phone]  . cinacalcet (SENSIPAR) tablet 60 mg  60 mg Oral Q M,W,F Reesa Chew, MD   60 mg at 12/16/20 0816  . Darbepoetin Alfa (ARANESP) injection 60 mcg  60 mcg Intravenous Q Mon-HD Reesa Chew, MD      . insulin aspart (novoLOG) injection 5 Units  5 Units Intravenous Once Kugler, [place], MD      . insulin pump   Subcutaneous Q4H Alma Friendly, MD      . pantoprazole (PROTONIX) injection 40 mg  40 mg Intravenous Q12H Toy Baker, MD   40  mg at 12/16/20 0816    Allergies as of 12/15/2020  . (No Known Allergies)    Family History  Problem Relation Age of Onset  . CAD Father   . Colon cancer Father   . Diabetes Brother     Social History   Socioeconomic History  . Marital status: Married    Spouse name: Not on file  . Number of children: Not on file  . Years of education: Not on file  . Highest education level: Not on file  Occupational History  . Not on file  Tobacco Use  . Smoking status: Former Smoker    Packs/day: 1.50    Years: 35.00    Pack years: 52.50    Types: Cigarettes    Quit date: 2014    Years since quitting: 8.2  . Smokeless tobacco: Never Used  . Tobacco comment: "quit e-cigs ~ 2014"  Vaping Use  . Vaping Use: Former  Substance and Sexual Activity  . Alcohol use: Never    Alcohol/week: 0.0 standard drinks  . Drug use: Never  . Sexual activity: Not on file  Other Topics Concern  . Not on file  Social History Narrative  . Not on file   Social Determinants of Health   Financial Resource Strain: Not on file  Food Insecurity: Not on file  Transportation Needs: Not on file  Physical Activity: Not on file  Stress: Not on file  Social Connections: Not on file  Intimate Partner Violence: Not on file    Review of Systems: Gen: Denies fever, sweats or chills. No weight loss.  CV: See HPI. Resp: Denies cough, shortness of breath of hemoptysis.  GI: See HPI. GU : Denies urinary burning, blood in urine,  increased urinary frequency or incontinence. MS: Denies joint pain, muscles aches or weakness. Derm: Denies rash, itchiness, skin lesions or unhealing ulcers. Psych: Denies depression, anxiety or memory loss. Heme: Denies easy bruising, bleeding. Neuro:  Denies headaches, dizziness or paresthesias. Endo:  + DM.   Physical Exam: Vital signs in last 24 hours: Temp:  [97.6 F (36.4 C)-98.5 F (36.9 C)] 98 F (36.7 C) (04/04 1155) Pulse Rate:  [42-78] 73 (04/04 1155) Resp:  [8-20] 20 (04/04 1155) BP: (118-170)/(47-65) 140/62 (04/04 1155) SpO2:  [95 %-99 %] 95 % (04/04 1155)   General:  Alert 65 year old [name] in no acute distress. Head:  Normocephalic and atraumatic. Eyes:  No scleral icterus. Conjunctiva pink. Ears:  Normal auditory acuity. Nose:  No deformity, discharge or lesions. Mouth:  Dentition intact. No ulcers or lesions.  Neck:  Supple. No lymphadenopathy or thyromegaly.  Lungs: Breath sounds clear throughout. Heart: Regular rate and rhythm, systolic murmur. Abdomen: Soft, nondistended.  Nontender.  Positive bowel sounds to all 4 quadrant.  No HSM. Rectal: Deferred. Musculoskeletal:  Symmetrical without gross deformities.  Pulses:  Normal pulses noted. Extremities:  Without clubbing or edema. LUE fistula.  Neurologic:  Alert and  oriented x4. No focal deficits.  Skin:  Intact without significant lesions or rashes. Psych:  Alert and cooperative. Normal mood and affect.  Intake/Output from previous day: 04/03 0701 - 04/04 0700 In: 0  Out: 1000  Intake/Output this shift: No intake/output data recorded.  Lab Results: Recent Labs    12/15/20 1636 12/15/20 1707 12/15/20 1708 12/16/20 0857  WBC 4.5  --   --  4.5  HGB 7.1* 6.8* 6.8* 7.4*  HCT 21.9* 20.0* 20.0* 22.1*  PLT 97*  --   --  103*  BMET Recent Labs    12/15/20 1636 12/15/20 1707 12/15/20 1708 12/16/20 0857  NA 132* 131* 131* 136  K 6.2* 6.2* 6.2* 5.0  CL 94* 96*  --  98  CO2 26  --   --   29  GLUCOSE 345* 329*  --  128*  BUN 67* 75*  --  32*  CREATININE 9.40* 9.70*  --  6.04*  CALCIUM 7.8*  --   --  8.7*   LFT Recent Labs    12/15/20 1636 12/16/20 0857  PROT 5.7*  --   ALBUMIN 3.3* 3.2*  AST 18  --   ALT 17  --   ALKPHOS 82  --   BILITOT 0.6  --    PT/INR Recent Labs    12/15/20 1636  LABPROT 15.3*  INR 1.3*   Hepatitis Panel No results for input(s): HEPBSAG, HCVAB, HEPAIGM, HEPBIGM in the last 72 hours.    Studies/Results: DG Chest Portable 1 View  Result Date: 12/15/2020 CLINICAL DATA:  Chest pain. EXAM: PORTABLE CHEST 1 VIEW COMPARISON:  September 07, 2019 FINDINGS: The heart, hila, and mediastinum are normal. No pneumothorax. No nodules or masses. No suspicious infiltrates or overt edema. IMPRESSION: No active disease. Electronically Signed   By: Dorise Bullion III M.D   On: 12/15/2020 17:41    IMPRESSION/PLAN:  37. 65 year old male admitted to the hospital dizziness, CP and vomiting (nonbloody emesis)with anemia and  + FOBT.  No upper or lower abdominal pain. No overt GI bleeding. Hg 7.1 -> 6.8 (baseline Hg 10.0 on 12/12/2019). Today Hg 7.4.  -Continue to Hold Eliquis  -PPI IV bid  -Clear liquid diet -Monitor H/H closely -Transfuse for Hg < 7 -EGD/colonoscopy when cardiac status stabilized, await further recommendations from Dr. Loletha Carrow   2. Atrial fibrillation. CHA2DS2 vasc score  4. Eliquis on hold.   3. CAD. Bradycardia.  4. Diastolic CHF. BNP 1,991.6  5. Thrombocytopenia. PLT 103. Normal LFTs.  -Consider abdominal sonogram to assess liver and spleen  6. CKD on HD. Cr 6.04. BUN 32.   7. Family (father) with history of colon cancer   8. DM on insulin    Noralyn Pick  12/16/2020, 12:41 PM  I have reviewed the entire case in detail with the above APP and discussed the plan in detail.  Therefore, I agree with the diagnoses recorded above. In addition,  I have personally interviewed and examined the patient using a [place] video  interpreter.  My additional thoughts are as follows:  Acute on chronic multifactorial anemia, CKD and occult GI blood loss with reported heme positive stool.  Admitted with symptomatic bradycardia and hyperkalemia, elevated BN P and troponin.  Awaiting cardiology consult for further recommendations.  EGD and colonoscopy prior to discharge after improvement in cardiopulmonary status.   Nelida Meuse III Office:[phone]

## 2020-12-17 DIAGNOSIS — R079 Chest pain, unspecified: Secondary | ICD-10-CM | POA: Diagnosis not present

## 2020-12-17 DIAGNOSIS — D62 Acute posthemorrhagic anemia: Secondary | ICD-10-CM | POA: Diagnosis not present

## 2020-12-17 DIAGNOSIS — R001 Bradycardia, unspecified: Secondary | ICD-10-CM | POA: Diagnosis not present

## 2020-12-17 DIAGNOSIS — I2510 Atherosclerotic heart disease of native coronary artery without angina pectoris: Secondary | ICD-10-CM | POA: Diagnosis not present

## 2020-12-17 DIAGNOSIS — K921 Melena: Secondary | ICD-10-CM | POA: Diagnosis not present

## 2020-12-17 DIAGNOSIS — I5032 Chronic diastolic (congestive) heart failure: Secondary | ICD-10-CM | POA: Diagnosis not present

## 2020-12-17 DIAGNOSIS — I480 Paroxysmal atrial fibrillation: Secondary | ICD-10-CM | POA: Diagnosis not present

## 2020-12-17 DIAGNOSIS — R195 Other fecal abnormalities: Secondary | ICD-10-CM | POA: Diagnosis not present

## 2020-12-17 LAB — TROPONIN I (HIGH SENSITIVITY)
Troponin I (High Sensitivity): 562 ng/L (ref <18)
Troponin I (High Sensitivity): 735 ng/L (ref <18)
Troponin I (High Sensitivity): 745 ng/L (ref <18)

## 2020-12-17 LAB — CBC WITH DIFFERENTIAL/PLATELET
Abs Immature Granulocytes: 0.01 10*3/uL (ref 0.00–0.07)
Basophils Absolute: 0 10*3/uL (ref 0.0–0.1)
Basophils Relative: 1 %
Eosinophils Absolute: 0.2 10*3/uL (ref 0.0–0.5)
Eosinophils Relative: 5 %
HCT: 20.2 % — ABNORMAL LOW (ref 39.0–52.0)
Hemoglobin: 6.7 g/dL — CL (ref 13.0–17.0)
Immature Granulocytes: 0 %
Lymphocytes Relative: 22 %
Lymphs Abs: 0.8 10*3/uL (ref 0.7–4.0)
MCH: 32.7 pg (ref 26.0–34.0)
MCHC: 33.2 g/dL (ref 30.0–36.0)
MCV: 98.5 fL (ref 80.0–100.0)
Monocytes Absolute: 0.4 10*3/uL (ref 0.1–1.0)
Monocytes Relative: 11 %
Neutro Abs: 2.3 10*3/uL (ref 1.7–7.7)
Neutrophils Relative %: 61 %
Platelets: 104 10*3/uL — ABNORMAL LOW (ref 150–400)
RBC: 2.05 MIL/uL — ABNORMAL LOW (ref 4.22–5.81)
RDW: 15.2 % (ref 11.5–15.5)
WBC: 3.8 10*3/uL — ABNORMAL LOW (ref 4.0–10.5)
nRBC: 0 % (ref 0.0–0.2)

## 2020-12-17 LAB — RENAL FUNCTION PANEL
Albumin: 3.1 g/dL — ABNORMAL LOW (ref 3.5–5.0)
Anion gap: 6 (ref 5–15)
BUN: 14 mg/dL (ref 8–23)
CO2: 32 mmol/L (ref 22–32)
Calcium: 7.9 mg/dL — ABNORMAL LOW (ref 8.9–10.3)
Chloride: 97 mmol/L — ABNORMAL LOW (ref 98–111)
Creatinine, Ser: 3.67 mg/dL — ABNORMAL HIGH (ref 0.61–1.24)
GFR, Estimated: 18 mL/min — ABNORMAL LOW (ref >60)
Glucose, Bld: 218 mg/dL — ABNORMAL HIGH (ref 70–99)
Phosphorus: 2.9 mg/dL (ref 2.5–4.6)
Potassium: 3.8 mmol/L (ref 3.5–5.1)
Sodium: 135 mmol/L (ref 135–145)

## 2020-12-17 LAB — GLUCOSE, CAPILLARY
Glucose-Capillary: 56 mg/dL — ABNORMAL LOW (ref 70–99)
Glucose-Capillary: 227 mg/dL — ABNORMAL HIGH (ref 70–99)
Glucose-Capillary: 103 mg/dL — ABNORMAL HIGH (ref 70–99)
Glucose-Capillary: 98 mg/dL (ref 70–99)
Glucose-Capillary: 98 mg/dL (ref 70–99)
Glucose-Capillary: 154 mg/dL — ABNORMAL HIGH (ref 70–99)

## 2020-12-17 LAB — HEMOGLOBIN AND HEMATOCRIT, BLOOD
HCT: 24.6 % — ABNORMAL LOW (ref 39.0–52.0)
Hemoglobin: 8.2 g/dL — ABNORMAL LOW (ref 13.0–17.0)

## 2020-12-17 LAB — PREPARE RBC (CROSSMATCH)

## 2020-12-17 MED ORDER — SODIUM CHLORIDE 0.9% IV SOLUTION: Freq: Once | INTRAVENOUS | Status: AC

## 2020-12-17 MED ORDER — ATORVASTATIN CALCIUM 80 MG PO TABS
80.0000 mg | ORAL_TABLET | Freq: Every day | ORAL | Status: DC
  Administered 2020-12-17 – 2020-12-25 (×9): 80 mg via ORAL
  Filled 2020-12-17 (×9): qty 1

## 2020-12-17 MED ORDER — DARBEPOETIN ALFA 60 MCG/0.3ML IJ SOSY
60.0000 ug | PREFILLED_SYRINGE | Freq: Once | INTRAMUSCULAR | Status: AC
  Administered 2020-12-17: 60 ug via SUBCUTANEOUS
  Filled 2020-12-17: qty 0.3

## 2020-12-17 MED ORDER — DARBEPOETIN ALFA 60 MCG/0.3ML IJ SOSY: 60.0000 ug | PREFILLED_SYRINGE | INTRAMUSCULAR | Status: DC

## 2020-12-17 MED ORDER — EZETIMIBE 10 MG PO TABS
10.0000 mg | ORAL_TABLET | Freq: Every day | ORAL | Status: DC
  Administered 2020-12-17 – 2020-12-25 (×9): 10 mg via ORAL
  Filled 2020-12-17 (×9): qty 1

## 2020-12-17 MED ORDER — NITROGLYCERIN IN D5W 200-5 MCG/ML-% IV SOLN
0.0000 ug/min | INTRAVENOUS | Status: DC
  Administered 2020-12-17: 5 ug/min via INTRAVENOUS
  Filled 2020-12-17: qty 250

## 2020-12-17 NOTE — Progress Notes (Addendum)
Inpatient Diabetes Program Recommendations  AACE/ADA: New Consensus Statement on Inpatient Glycemic Control (2015)  Target Ranges:  Prepandial:   less than 140 mg/dL      Peak postprandial:   less than 180 mg/dL (1-2 hours)      Critically ill patients:  140 - 180 mg/dL   Lab Results  Component Value Date   GLUCAP 103 (H) 12/17/2020   HGBA1C 5.9 (H) 12/16/2020    Review of Glycemic Control Results for Kenneth [name], Kenneth [name] (MRN [id number]) as of 12/17/2020 09:38  Ref. Range 12/16/2020 17:53 12/16/2020 20:15 12/17/2020 02:13 12/17/2020 03:25 12/17/2020 08:10  Glucose-Capillary Latest Ref Range: 70 - 99 mg/dL 178 (H) 153 (H) 56 (L) 227 (H) 103 (H)   Admit with: Bradycardia  History: Type 1 Diabetes, ESRD  Home DM Meds: Insulin Pump  Current Orders: Insulin Pump Q4 hours  Inpatient Diabetes Program Recommendations:     Asked patient to call endocrine office, Dr. Hartford Poli and let them know he is inpatient and cbg's are running on low side.  Explained to patient that his endo may ask him to decrease pump settings by 20%.   He states he will call endo this morning and is aware how to change pump settings.  Will follow up with him later this morning.  Notified Dr. Horris Latino via secure chat of recommendation.    Addendum @ 1610; Patient did not call Dr. Shirlyn Goltz office for insulin pump adjustments.  Will leave message with Dr. Shirlyn Goltz office and ask them to reach out to patient or this DM coordinator.   Please be sure to check 0200 am CBG and encourage po intake.    Will continue to follow while inpatient.  Thank you, Reche Dixon, RN, BSN Diabetes Coordinator Inpatient Diabetes Program [phone] (team pager from 8a-5p)

## 2020-12-17 NOTE — Progress Notes (Signed)
Progress Note  Patient Name: Kenneth [name] Date of Encounter: 12/17/2020  [facility] Cardiologist: No primary care provider on file.   Subjective   Patient lying comfortably in bed this morning. States chest tightness is improved. No nausea or vomiting. Having BRBPR. No lightheadedness  Trop rising from 45-->137-->745 Hemoglobin 7.3-->6.7 with episodes of BRPBR; receiving 1 unit pRBC Remains in NSR with HR 60s  Inpatient Medications    Scheduled Meds: . Chlorhexidine Gluconate Cloth  6 each Topical Q0600  . cinacalcet  60 mg Oral Q M,W,F  . darbepoetin (ARANESP) injection - DIALYSIS  60 mcg Intravenous Q Mon-HD  . insulin aspart  5 Units Intravenous Once  . insulin pump   Subcutaneous Q4H  . pantoprazole (PROTONIX) IV  40 mg Intravenous Q12H   Continuous Infusions: . sodium chloride     PRN Meds: acetaminophen   Vital Signs    Vitals:   12/17/20 0134 12/17/20 0211 12/17/20 0613 12/17/20 0652  BP: (!) 119/50 (!) 131/51 (!) 158/55 (!) 148/54  Pulse: 61  63 (!) 59  Resp: '19 16 17   '$ Temp: 98.1 F (36.7 C)  98.5 F (36.9 C) 98.1 F (36.7 C)  TempSrc:   Oral Oral  SpO2:   98% 99%    Intake/Output Summary (Last 24 hours) at 12/17/2020 0736 Last data filed at 12/17/2020 0134 Gross per 24 hour  Intake 30 ml  Output 2500 ml  Net -2470 ml   Last 3 Weights 11/28/2020 10/31/2020 01/23/2020  Weight (lbs) 133 lb 137 lb 12.8 oz 137 lb 9.6 oz  Weight (kg) 60.328 kg 62.506 kg 62.415 kg      Telemetry    NSR with HR 60s - Personally Reviewed  ECG    NSR with LVH and repol; borderline right axis - Personally Reviewed  Physical Exam   GEN: No acute distress.   Neck: No JVD Cardiac: RRR, 1/6 systolic murmur. No rubs, or gallops.  Respiratory: Clear to auscultation bilaterally. GI: Soft, nontender, non-distended  MS: No edema; No deformity. Neuro:  Nonfocal  Psych: Normal affect   Labs    High Sensitivity Troponin:   Recent Labs  Lab 12/16/20 0406  12/16/20 0627 12/16/20 1706 12/16/20 2334 12/17/20 0439  TROPONINIHS 45* 48* 137* 562* 745*      Chemistry Recent Labs  Lab 12/15/20 1636 12/15/20 1707 12/15/20 1708 12/16/20 0857 12/17/20 0439  NA 132* 131* 131* 136 135  K 6.2* 6.2* 6.2* 5.0 3.8  CL 94* 96*  --  98 97*  CO2 26  --   --  29 32  GLUCOSE 345* 329*  --  128* 218*  BUN 67* 75*  --  32* 14  CREATININE 9.40* 9.70*  --  6.04* 3.67*  CALCIUM 7.8*  --   --  8.7* 7.9*  PROT 5.7*  --   --   --   --   ALBUMIN 3.3*  --   --  3.2* 3.1*  AST 18  --   --   --   --   ALT 17  --   --   --   --   ALKPHOS 82  --   --   --   --   BILITOT 0.6  --   --   --   --   GFRNONAA 6*  --   --  10* 18*  ANIONGAP 12  --   --  9 6     Hematology Recent Labs  Lab  12/16/20 0857 12/16/20 1600 12/17/20 0439  WBC 4.5 5.0 3.8*  RBC 2.24* 2.26* 2.05*  HGB 7.4* 7.3* 6.7*  HCT 22.1* 22.6* 20.2*  MCV 98.7 100.0 98.5  MCH 33.0 32.3 32.7  MCHC 33.5 32.3 33.2  RDW 15.5 15.5 15.2  PLT 103* 112* 104*    BNP Recent Labs  Lab 12/16/20 0406  BNP 1,991.6*     DDimer No results for input(s): DDIMER in the last 168 hours.   Radiology    DG Chest Portable 1 View  Result Date: 12/15/2020 CLINICAL DATA:  Chest pain. EXAM: PORTABLE CHEST 1 VIEW COMPARISON:  September 07, 2019 FINDINGS: The heart, hila, and mediastinum are normal. No pneumothorax. No nodules or masses. No suspicious infiltrates or overt edema. IMPRESSION: No active disease. Electronically Signed   By: Dorise Bullion III M.D   On: 12/15/2020 17:41    Cardiac Studies   TTE 11/28/20:  1. Left ventricular ejection fraction, by estimation, is 55 to 60%. The  left ventricle has normal function. The left ventricle has no regional  wall motion abnormalities. There is moderate left ventricular hypertrophy.  Left ventricular diastolic  parameters are consistent with Grade II diastolic dysfunction  (pseudonormalization). Elevated left atrial pressure.  2. Right ventricular  systolic function is normal. The right ventricular  size is normal.  3. The mitral valve is normal in structure. Trivial mitral valve  regurgitation. No evidence of mitral stenosis.  4. The aortic valve is tricuspid. Aortic valve regurgitation is trivial.  Mild aortic valve sclerosis is present, with no evidence of aortic valve  stenosis.  5. The inferior vena cava is normal in size with greater than 50%  respiratory variability, suggesting right atrial pressure of 3 mmHg.   Cath 04/17/2019: 1. Occluded SVG-RCA and SVG-ramus, patent LIMA-LAD.  2. The large ramus was patent with 40-50% mid ramus stenosis (in-stent restenosis).  3. 80% ostial stenosis very small caliber AV LCx. Not interventional target.  4. Occluded RCA (known from prior) with collaterals.   No target for intervention. Aggressive medical management of angina.   Myoview 11/15/18:  Baseline EKG showed NSR with LVH and repolarization abnormality in the inferolateral leads. No change in ST segments during Lexiscan infusion.  Nuclear stress EF: 54%.  The left ventricular ejection fraction is mildly decreased (45-54%).  There is a small defect of mild severity present in the basal inferior and mid inferior location. The defect is non-reversible and in the setting of normal LVF is consistent with diaphragmatic attenuation artifact. No ischemia noted.  This is a low risk study.   Patient Profile     65 y.o. male with a hx of HTN, HLD, DMII, Afib, ESRD on HD, CAD s/p CABG with multiple subsequent PCIs who presented with chest tightness found to be bradycardic to 40s in the setting of BB use responsive to atropine with course complicated by trop rise 48-->745. Cardiology has now been consulted for management of bradycardia and NSTEMI.  Assessment & Plan    #NSTEMI #CAD s/p CABG with multiple subsequent PCIs and caths: Patient with extensive history of CAD s/p CABG in 2017 with several subsequent NSTEMIs/PCIs with last  in 2020. Presented with bradycardia and chest tightness with initial troponins in 40s. Trop rose overnight from 100-->500s-->745 this AM. Currently with continued substernal chest tightness. Notably, this rise correlates with drop in hemoglobin and episodes of bright red blood per rectum suggesting element of demand in the setting of known significant CAD. Will need to stabilize from  GIB standpoint first prior to proceeding with further work-up of NSTEMI. -Manage GIB and anemia per GI and primary team -Holding antiplatelet agents and AC due to GIB; resume once able -Will start nitro gtt for symptoms -Resume lipitor '80mg'$  daily and zetia '10mg'$  daily -Holding ARB due to bleed and metop due to bradycardia -Once stable from GIB standpoint, likely will need repeat coronary angiography given acute rise in troponin and known significant CAD  #Symptomatic Bradycardia: Improved s/p holding metop. Remains in NSR today without significant ectopy -Stopped metop -Monitor on telemetry  #Chronic Diastolic Heart Failure: LVEF 55-60%, moderate LVH, G2DD. Does not appear acutely decompensated. -Continue volume manangement with HD -Hold metop due to symptomatic bradycardia -Holding losartan due to GIB -Follows with HF clinic  #Acute on chronic anemia #GIB: HgB 6.7 this AM from baseline of 10 with episodes of BRBPR -Management per GI -Holding eliquis -Will need to be stabilized from GI standpoint prior to proceeding with ischemic work-up  #Atrial Fibrillation: CHADs-vasc 4 -Holding eliquis given GIB as above -Hold BB due to symptomatic bradycardia as above  #HTN: -Currently being held due to concern for GIB and recent bradycardia episode; resume once more clinically stable  #HLD: LDL 52. -Resume atorvastatin '80mg'$  daily and zetia '10mg'$  daily  #Carotid Stenosis: Mild on recent carotid ultrasound. -Continue statin and zetia as above  #ESRD: -Continue scheduled HD  #DMII: -On insulin       For questions or updates, please contact [facility] Please consult [email or web address] for contact info under        Signed, Freada Bergeron, MD  12/17/2020, 7:36 AM

## 2020-12-17 NOTE — Progress Notes (Signed)
Trop 562 provider Hal Hope, MD was page. A stat EKG was order and completed, a repeat trop and CBC was ordered.

## 2020-12-17 NOTE — Progress Notes (Signed)
PROGRESS NOTE  Kenneth [name] [id number] DOB: [birth year] DOA: 12/15/2020 PCP: [name], MD  HPI/Recap of past 24 hours: HPI from Dr Magdalene River is a 65 y.o. male with medical history significant of HLD, HTN, DM1, A.fib, ESRD on HD, CAD sp CABG presented with CP tightness and weakness, took 2 nitro's at home. On EMS arrival bradycardic 40's and poorly responsive, given atropine 1 mg, HR up to 40-60's. Has not been feeling well for the past 1 wk, reports blood in stool intermittently. No black stools. Last HD was on Friday as scheduled. No fever no n/V/D. In the ED, K 6.2, EKG showed junctional rhythm, pt was given Lokelma glucose and insulin.  Nephrology consulted and subsequently had HD. Noted to have hemoglobin drop 2 units, with hemoccult positive stools.  GI consulted.  Kenneth [name] admitted for further management.     Today, Kenneth [name] still complains of some intermittent substernal chest pain, some dark stools but not black.  Denies any bright red blood per rectum.    Assessment/Plan: Active Problems:   ESRD (end stage renal disease) (HCC)   CAD (coronary artery disease)   Chest pain   Gastroesophageal reflux disease   Chronic diastolic CHF (congestive heart failure) (HCC)   Hyperkalemia   Hyperlipidemia   AF (paroxysmal atrial fibrillation) (HCC)   Symptomatic bradycardia   Postural dizziness with presyncope   Symptomatic anemia   Blood in stool   Symptomatic bradycardia S/p atropine in the ED, HR currently in the 60-70s EKG with no acute ST changes Echo done on 11/2020 showed EF of 55 to [id number], grade 2 diastolic dysfunction Continue to hold home metoprolol Cardiology consulted due to extensive cardiac history, and plans to ?restart BB Telemetry  Extensive history of CAD s/p CABG Possible NSTEMI versus demand ischemia Hyperlipidemia Complaining of substernal chest pain Troponin rising up to >700  BNP 1991 EKG with no acute ST changes Echo as above Continue  to hold metoprolol, imdur, losartan Continue Lipitor, Zetia Cardiology consulted, started on nitro gtt. on 12/17/2020, plan to cath stable from a GI standpoint Telemetry  Possible GI bleed Anemia of CKD Baseline hemoglobin around 9-10, hgb drop to 6.7 Reported intermittent blood in stool, FOBT positive Anemia panel WNL except for mildly elevated ferritin Transfused 1U of PRBC on 12/17/20 Hold home Eliquis PPI twice daily GI consulted, appreciate recs CLD for now Daily CBC  ESRD Hyperkalemia Compliant with HD, M/W/F Nephrology consulted  Chronic diastolic HF Volume management Per HD  Diabetes mellitus type 1, controlled  A1c-->5.9 Noted an episode of hypoglycemia on 12/17/20 (?on CLD)  Continue home insulin pump, SSI, Accu-Cheks, hypoglycemic protocol Diabetes coordinator on board  Hypertension Hold metoprolol, Norvasc, losartan, hydralazine for now due to GI bleed  Paroxysmal A. Fib Currently in sinus rhythm Hold metoprolol due to bradycardia, hold Eliquis due to possible GI bleed Telemetry  Generalized weakness/deconditioned PT/OT         Estimated body mass index is 20.83 kg/m as calculated from the following:   Height as of 10/03/19: '5\' 7"'$  (1.702 m).   Weight as of 11/28/20: 60.3 kg.     Code Status: Full  Family Communication: Discussed with Kenneth [name], none at bedside  Disposition Plan: Status is: Inpatient  Remains inpatient appropriate because:Inpatient level of care appropriate due to severity of illness   Dispo: The Kenneth [name] is from: Home              Anticipated d/c is to: Home  Kenneth [name] currently is not medically stable to d/c.   Difficult to place Kenneth [name] No    Consultants:  Nephrology  GI  Cardiology    Procedures:  None  Antimicrobials:  None  DVT prophylaxis: SCDs, held Eliquis   Objective: Vitals:   12/17/20 0652 12/17/20 0812 12/17/20 0925 12/17/20 1207  BP: (!) 148/54 (!) 146/65 (!) 148/47 (!) 160/48   Pulse: (!) 59 64 62 60  Resp:  '20 12 20  '$ Temp: 98.1 F (36.7 C) 98 F (36.7 C) 98.3 F (36.8 C) 98.4 F (36.9 C)  TempSrc: Oral  Oral Oral  SpO2: 99% 99% 100% 100%    Intake/Output Summary (Last 24 hours) at 12/17/2020 1602 Last data filed at 12/17/2020 [id number] Gross per 24 hour  Intake 356 ml  Output 2500 ml  Net -2144 ml   There were no vitals filed for this visit.  Exam:  General: NAD, appears deconditioned  Cardiovascular: S1, S2 present  Respiratory: CTAB  Abdomen: Soft, nontender, nondistended, bowel sounds present, insulin pump present  Musculoskeletal: No bilateral pedal edema noted  Skin: Normal  Psychiatry: Normal mood    Data Reviewed: CBC: Recent Labs  Lab 12/15/20 1636 12/15/20 1707 12/15/20 1708 12/16/20 0857 12/16/20 1600 12/17/20 0439 12/17/20 1143  WBC 4.5  --   --  4.5 5.0 3.8*  --   NEUTROABS 2.8  --   --  2.9  --  2.3  --   HGB 7.1*   < > 6.8* 7.4* 7.3* 6.7* 8.2*  HCT 21.9*   < > 20.0* 22.1* 22.6* 20.2* 24.6*  MCV 100.9*  --   --  98.7 100.0 98.5  --   PLT 97*  --   --  103* 112* 104*  --    < > = values in this interval not displayed.   Basic Metabolic Panel: Recent Labs  Lab 12/15/20 1636 12/15/20 1707 12/15/20 1708 12/16/20 0406 12/16/20 0857 12/17/20 0439  NA 132* 131* 131*  --  136 135  K 6.2* 6.2* 6.2*  --  5.0 3.8  CL 94* 96*  --   --  98 97*  CO2 26  --   --   --  29 32  GLUCOSE 345* 329*  --   --  128* 218*  BUN 67* 75*  --   --  32* 14  CREATININE 9.40* 9.70*  --   --  6.04* 3.67*  CALCIUM 7.8*  --   --   --  8.7* 7.9*  PHOS  --   --   --  4.2 4.3 2.9   GFR: CrCl cannot be calculated (Unknown ideal weight.). Liver Function Tests: Recent Labs  Lab 12/15/20 1636 12/16/20 0857 12/17/20 0439  AST 18  --   --   ALT 17  --   --   ALKPHOS 82  --   --   BILITOT 0.6  --   --   PROT 5.7*  --   --   ALBUMIN 3.3* 3.2* 3.1*   No results for input(s): LIPASE, AMYLASE in the last 168 hours. No results for input(s):  AMMONIA in the last 168 hours. Coagulation Profile: Recent Labs  Lab 12/15/20 1636  INR 1.3*   Cardiac Enzymes: No results for input(s): CKTOTAL, CKMB, CKMBINDEX, TROPONINI in the last 168 hours. BNP (last 3 results) No results for input(s): PROBNP in the last 8760 hours. HbA1C: Recent Labs    12/16/20 1600  HGBA1C 5.9*   CBG: Recent Labs  Lab 12/16/20 2015 12/17/20 0213 12/17/20 0325 12/17/20 0810 12/17/20 1202  GLUCAP 153* 56* 227* 103* 98   Lipid Profile: No results for input(s): CHOL, HDL, LDLCALC, TRIG, CHOLHDL, LDLDIRECT in the last 72 hours. Thyroid Function Tests: No results for input(s): TSH, T4TOTAL, FREET4, T3FREE, THYROIDAB in the last 72 hours. Anemia Panel: Recent Labs    12/16/20 1600  VITAMINB12 304  FOLATE 6.7  FERRITIN 1,038*  TIBC 262  IRON 72   Urine analysis:    Component Value Date/Time   COLORURINE YELLOW 10/30/2016 2339   APPEARANCEUR CLEAR 10/30/2016 2339   LABSPEC 1.010 10/30/2016 2339   PHURINE 5.0 10/30/2016 2339   GLUCOSEU NEGATIVE 10/30/2016 2339   HGBUR NEGATIVE 10/30/2016 2339   BILIRUBINUR NEGATIVE 10/30/2016 2339   KETONESUR NEGATIVE 10/30/2016 2339   PROTEINUR 100 (A) 10/30/2016 2339   NITRITE NEGATIVE 10/30/2016 2339   LEUKOCYTESUR NEGATIVE 10/30/2016 2339   Sepsis Labs: '@LABRCNTIP'$ (procalcitonin:4,lacticidven:4)  ) Recent Results (from the past 240 hour(s))  Resp Panel by RT-PCR (Flu A&B, Covid) Nasopharyngeal Swab     Status: None   Collection Time: 12/15/20  8:57 PM   Specimen: Nasopharyngeal Swab; Nasopharyngeal(NP) swabs in vial transport medium  Result Value Ref Range Status   SARS Coronavirus 2 by RT PCR NEGATIVE NEGATIVE Final    Comment: (NOTE) SARS-CoV-2 target nucleic acids are NOT DETECTED.  The SARS-CoV-2 RNA is generally detectable in upper respiratory specimens during the acute phase of infection. The lowest concentration of SARS-CoV-2 viral copies this assay can detect is 138 copies/mL. A  negative result does not preclude SARS-Cov-2 infection and should not be used as the sole basis for treatment or other Kenneth [name] management decisions. A negative result may occur with  improper specimen collection/handling, submission of specimen other than nasopharyngeal swab, presence of viral mutation(s) within the areas targeted by this assay, and inadequate number of viral copies(<138 copies/mL). A negative result must be combined with clinical observations, Kenneth [name] history, and epidemiological information. The expected result is Negative.  Fact Sheet for Patients:  EntrepreneurPulse.com.au  Fact Sheet for Healthcare Providers:  IncredibleEmployment.be  This test is no t yet approved or cleared by the [place] FDA and  has been authorized for detection and/or diagnosis of SARS-CoV-2 by FDA under an Emergency Use Authorization (EUA). This EUA will remain  in effect (meaning this test can be used) for the duration of the COVID-19 declaration under Section 564(b)(1) of the Act, 21 U.S.C.section 360bbb-3(b)(1), unless the authorization is terminated  or revoked sooner.       Influenza A by PCR NEGATIVE NEGATIVE Final   Influenza B by PCR NEGATIVE NEGATIVE Final    Comment: (NOTE) The Xpert Xpress SARS-CoV-2/FLU/RSV plus assay is intended as an aid in the diagnosis of influenza from Nasopharyngeal swab specimens and should not be used as a sole basis for treatment. Nasal washings and aspirates are unacceptable for Xpert Xpress SARS-CoV-2/FLU/RSV testing.  Fact Sheet for Patients: EntrepreneurPulse.com.au  Fact Sheet for Healthcare Providers: IncredibleEmployment.be  This test is not yet approved or cleared by the [place] FDA and has been authorized for detection and/or diagnosis of SARS-CoV-2 by FDA under an Emergency Use Authorization (EUA). This EUA will remain in effect (meaning this test can  be used) for the duration of the COVID-19 declaration under Section 564(b)(1) of the Act, 21 U.S.C. section 360bbb-3(b)(1), unless the authorization is terminated or revoked.  Performed at [facility], [place] [address], [place], [place] [address]  Studies: No results found.  Scheduled Meds: . atorvastatin  80 mg Oral Daily  . Chlorhexidine Gluconate Cloth  6 each Topical Q0600  . cinacalcet  60 mg Oral Q M,W,F  . [START ON 12/25/2020] darbepoetin (ARANESP) injection - DIALYSIS  60 mcg Intravenous Q Wed-HD  . ezetimibe  10 mg Oral Daily  . insulin aspart  5 Units Intravenous Once  . insulin pump   Subcutaneous Q4H  . pantoprazole (PROTONIX) IV  40 mg Intravenous Q12H    Continuous Infusions: . sodium chloride    . nitroGLYCERIN 15 mcg/min (12/17/20 1313)     LOS: 2 days     Alma Friendly, MD [facility]  If 7PM-7AM, please contact night-coverage [email or web address] 12/17/2020, 4:02 PM

## 2020-12-17 NOTE — Plan of Care (Signed)

## 2020-12-17 NOTE — Progress Notes (Addendum)
[facility] Progress Note  CC:   Anemia, positive FOBT  Subjective: He is sitting up in the bed. He had lower chest pain below the xyphoid process which radiated to the epigastric area. He was started on NGT infusion a few hours ago. Mild nausea no vomiting. He stated his lower chest pain is less since starting NTG infusion. He developed a headache on Nitro. No cough, feels a little short of breath. He reported passing a darker brown, not black stool without any blood at 2am today. He denies having any rectal bleeding. Wife and other family member at the bedside.   [employer] interpreter was utilized to communicate with the patient.   Hg dropped to 6.7 this am. He received 1 unit of PRBCs, post transfusion H/H ordered, not yet completed.    Objective:  Vital signs in last 24 hours: Temp:  [98 F (36.7 C)-98.5 F (36.9 C)] 98.3 F (36.8 C) (04/05 0925) Pulse Rate:  [59-73] 62 (04/05 0925) Resp:  [12-22] 12 (04/05 0925) BP: (113-158)/(35-120) 148/47 (04/05 0925) SpO2:  [95 %-100 %] 100 % (04/05 0925) Last BM Date: 12/16/20 General:   Alert in NAD.  Heart: RRR, systolic murmur.  Pulm:  Breath sounds clear.  Chest: Mediastinal scars intact. Tenderness to the xyphoid process.  Abdomen: Mild epigastric tenderness without rebound or guarding. Nondistended. + BS x 4 quads. No HSM.No mass.  Extremities:  Without edema. LUE fistula with + bruit and thrill.  Neurologic:  Alert and  oriented x4;  grossly normal neurologically. Psych:  Alert and cooperative. Normal mood and affect.  Intake/Output from previous day: 04/04 0701 - 04/05 0700 In: 30 [P.O.:30] Out: 2500  Intake/Output this shift: Total I/O In: 356 [Blood:356] Out: -   Lab Results: Recent Labs    12/16/20 0857 12/16/20 1600 12/17/20 0439  WBC 4.5 5.0 3.8*  HGB 7.4* 7.3* 6.7*  HCT 22.1* 22.6* 20.2*  PLT 103* 112* 104*   BMET Recent Labs    12/15/20 1636 12/15/20 1707 12/15/20 1708 12/16/20 0857  12/17/20 0439  NA 132* 131* 131* 136 135  K 6.2* 6.2* 6.2* 5.0 3.8  CL 94* 96*  --  98 97*  CO2 26  --   --  29 32  GLUCOSE 345* 329*  --  128* 218*  BUN 67* 75*  --  32* 14  CREATININE 9.40* 9.70*  --  6.04* 3.67*  CALCIUM 7.8*  --   --  8.7* 7.9*   LFT Recent Labs    12/15/20 1636 12/16/20 0857 12/17/20 0439  PROT 5.7*  --   --   ALBUMIN 3.3*   < > 3.1*  AST 18  --   --   ALT 17  --   --   ALKPHOS 82  --   --   BILITOT 0.6  --   --    < > = values in this interval not displayed.   PT/INR Recent Labs    12/15/20 1636  LABPROT 15.3*  INR 1.3*   Hepatitis Panel No results for input(s): HEPBSAG, HCVAB, HEPAIGM, HEPBIGM in the last 72 hours.  DG Chest Portable 1 View  Result Date: 12/15/2020 CLINICAL DATA:  Chest pain. EXAM: PORTABLE CHEST 1 VIEW COMPARISON:  September 07, 2019 FINDINGS: The heart, hila, and mediastinum are normal. No pneumothorax. No nodules or masses. No suspicious infiltrates or overt edema. IMPRESSION: No active disease. Electronically Signed   By: Dorise Bullion III M.D   On: 12/15/2020  17:41    Assessment / Plan:  41. 65 year old male admitted to the hospital dizziness, CP and vomiting (nonbloody emesis)with anemia and  + FOBT.  No upper or lower abdominal pain. Hg 7.1 -> 6.8 (baseline Hg 10.0 on 12/12/2019). Hg 7.4 -> 6.7.  BUN 14. Transfused 1 unit of PRBCs. Post transfusion H/H ordered, not yet completed.  -Continue to Hold Eliquis  -PPI IV bid  -Clear liquid diet -Await post transfusion H/H result -Monitor H/H closely -Transfuse for Hg < 7 -Nursing staff to monitor BM color and to document any black stools or rectal bleeding  -EGD/colonoscopy when cardiac status stabilized, await further recommendations from Dr. Loletha Carrow   2. History of atrial fibrillation. CHA2DS2 vasc score 4. Eliquis on hold. Currently in NSR.  3. CAD. Bradycardia. Metoprolol on hold. HR in the 60s. Chest pain this am. Troponin today 745. On NGT gtt.   4. Diastolic CHF.  BNP 1,991.6  5. Thrombocytopenia. PLT 104. Normal LFTs.   6. CKD on HD. Cr 6.04. BUN 32. Last dialysis session was 4/4.   7. Family (father) with history of colon cancer   8. DM on insulin    Active Problems:   ESRD (end stage renal disease) (HCC)   CAD (coronary artery disease)   Chest pain   Gastroesophageal reflux disease   Chronic diastolic CHF (congestive heart failure) (HCC)   Hyperkalemia   Hyperlipidemia   AF (paroxysmal atrial fibrillation) (HCC)   Symptomatic bradycardia   Postural dizziness with presyncope   Symptomatic anemia   Blood in stool     LOS: 2 days   Noralyn Pick  12/17/2020, 11:08 AM  I have discussed the case with the PA, and that is the plan I formulated. I personally interviewed and examined the patient.  CC: Acute on chronic blood loss anemia  Unfortunately, this patient has had recurrent unstable angina with rising troponin in the setting of worsening anemia.  I agree with the cardiology consultant that the acute precipitating factor is the ongoing GI bleeding causing anemia and demand cardiac ischemia.  This is a very complex and risky situation because he needs endoscopic testing but is at high risk of decompensation and even death from the sedation related to that given the stress on his heart and the anemia.  He is receiving further PRBC transfusion.  His anticoagulation is on hold.  We are also getting variable reports of what kind of overt bleeding if any he is currently having.  We are trying hard to get the nursing to document what his bowel movements look like, and actually photo documenting them in the chart will be even better.  We are trying to decide whether an EGD or colonoscopy is more pressing.  If we cannot determine that, then he will need both in a high risk setting. Unfortunately, his chronic kidney disease precludes CT angiogram.  Given his acute cardiac issue and ongoing bleeding, I would prefer he were not off  the medical floor for hours in order to do a tagged RBC scan.  He was given a soft diet this evening, but I will make him n.p.o. after midnight for the option of doing an upper endoscopy tomorrow to rule out a source of upper GI bleeding.  We will revisit this in the morning.  If he has brisk GI bleeding overnight, please do not hesitate to call the on-call physician for our practice.    Nelida Meuse III Office: [phone]

## 2020-12-17 NOTE — Progress Notes (Signed)
[facility] Progress Note  Subjective: no c/o today  Vitals:   12/17/20 0652 12/17/20 0812 12/17/20 0925 12/17/20 1207  BP: (!) 148/54 (!) 146/65 (!) 148/47 (!) 160/48  Pulse: (!) 59 64 62 60  Resp:  '20 12 20  '$ Temp: 98.1 F (36.7 C) 98 F (36.7 C) 98.3 F (36.8 C) 98.4 F (36.9 C)  TempSrc: Oral  Oral Oral  SpO2: 99% 99% 100% 100%    Exam:   alert, nad   no jvd  Chest cta bilat  Cor reg no RG  Abd soft ntnd no ascites   Ext no LE edema   Alert, NF, ox3    AVF+bruit    OP HD: MWF AF  3h 57mn  400/500   59.5kg  2/2 bath  AVF     Assessment/ Plan: 1. Symptomatic bradycardia: per pmd 2. CAD/ atrial fib - eliquis on hold for anemia 3. Anemia - r/o GIB, GI consulting. Hb 7-8's.  4. ESRD: usual HD MWF. Plan HD tomorrow.  5. Volume/ hypertension: EDW 59.5kg. Attempt to achieve EDW as tolerated 6. Anemia of Chronic Kidney Disease: Hemoglobin 6.8 was 8.6 on 3/30 concerning for possible blood loss. Transfusion tonight. Currently receiving mircera 581m q 4wks due tomorrow. Will give aranesp 6082mweekly. Hold weekly iron given transfusion 7. Secondary Hyperparathyroidism/Hyperphosphatemia: Sensipar 74m70m/ dialysis. Check phos  8. Vascular access: AVF with significant pulsatility and prolonged bleeding. Being evaluated outpt per patient   Rob Kelly Splinter/2022, 2:27 PM   Recent Labs  Lab 12/16/20 0857 12/16/20 1600 12/17/20 0439 12/17/20 1143  K 5.0  --  3.8  --   BUN 32*  --  14  --   CREATININE 6.04*  --  3.67*  --   CALCIUM 8.7*  --  7.9*  --   PHOS 4.3  --  2.9  --   HGB 7.4*   < > 6.7* 8.2*   < > = values in this interval not displayed.   Inpatient medications: . atorvastatin  80 mg Oral Daily  . Chlorhexidine Gluconate Cloth  6 each Topical Q0600  . cinacalcet  60 mg Oral Q M,W,F  . [START ON 12/25/2020] darbepoetin (ARANESP) injection - DIALYSIS  60 mcg Intravenous Q Wed-HD  . ezetimibe  10 mg Oral Daily  . insulin aspart  5 Units  Intravenous Once  . insulin pump   Subcutaneous Q4H  . pantoprazole (PROTONIX) IV  40 mg Intravenous Q12H   . sodium chloride    . nitroGLYCERIN 15 mcg/min (12/17/20 1313)   acetaminophen

## 2020-12-17 NOTE — Progress Notes (Signed)
Pt trop is 745 provider notified via page ,Serita Butcher

## 2020-12-18 DIAGNOSIS — I5032 Chronic diastolic (congestive) heart failure: Secondary | ICD-10-CM | POA: Diagnosis not present

## 2020-12-18 DIAGNOSIS — K922 Gastrointestinal hemorrhage, unspecified: Secondary | ICD-10-CM | POA: Diagnosis not present

## 2020-12-18 DIAGNOSIS — K921 Melena: Secondary | ICD-10-CM

## 2020-12-18 DIAGNOSIS — N186 End stage renal disease: Secondary | ICD-10-CM | POA: Diagnosis not present

## 2020-12-18 DIAGNOSIS — R001 Bradycardia, unspecified: Secondary | ICD-10-CM | POA: Diagnosis not present

## 2020-12-18 DIAGNOSIS — D649 Anemia, unspecified: Secondary | ICD-10-CM | POA: Diagnosis not present

## 2020-12-18 DIAGNOSIS — I2510 Atherosclerotic heart disease of native coronary artery without angina pectoris: Secondary | ICD-10-CM | POA: Diagnosis not present

## 2020-12-18 DIAGNOSIS — R079 Chest pain, unspecified: Secondary | ICD-10-CM | POA: Diagnosis not present

## 2020-12-18 DIAGNOSIS — I480 Paroxysmal atrial fibrillation: Secondary | ICD-10-CM | POA: Diagnosis not present

## 2020-12-18 LAB — GLUCOSE, CAPILLARY
Glucose-Capillary: 116 mg/dL — ABNORMAL HIGH (ref 70–99)
Glucose-Capillary: 121 mg/dL — ABNORMAL HIGH (ref 70–99)
Glucose-Capillary: 140 mg/dL — ABNORMAL HIGH (ref 70–99)
Glucose-Capillary: 146 mg/dL — ABNORMAL HIGH (ref 70–99)
Glucose-Capillary: 158 mg/dL — ABNORMAL HIGH (ref 70–99)
Glucose-Capillary: 170 mg/dL — ABNORMAL HIGH (ref 70–99)
Glucose-Capillary: 206 mg/dL — ABNORMAL HIGH (ref 70–99)
Glucose-Capillary: 255 mg/dL — ABNORMAL HIGH (ref 70–99)
Glucose-Capillary: 67 mg/dL — ABNORMAL LOW (ref 70–99)

## 2020-12-18 LAB — CBC WITH DIFFERENTIAL/PLATELET
Abs Immature Granulocytes: 0.01 10*3/uL (ref 0.00–0.07)
Basophils Absolute: 0 10*3/uL (ref 0.0–0.1)
Basophils Relative: 1 %
Eosinophils Absolute: 0.2 10*3/uL (ref 0.0–0.5)
Eosinophils Relative: 5 %
HCT: 24.4 % — ABNORMAL LOW (ref 39.0–52.0)
Hemoglobin: 8.3 g/dL — ABNORMAL LOW (ref 13.0–17.0)
Immature Granulocytes: 0 %
Lymphocytes Relative: 28 %
Lymphs Abs: 1.1 10*3/uL (ref 0.7–4.0)
MCH: 32.9 pg (ref 26.0–34.0)
MCHC: 34 g/dL (ref 30.0–36.0)
MCV: 96.8 fL (ref 80.0–100.0)
Monocytes Absolute: 0.5 10*3/uL (ref 0.1–1.0)
Monocytes Relative: 12 %
Neutro Abs: 2.2 10*3/uL (ref 1.7–7.7)
Neutrophils Relative %: 54 %
Platelets: 98 10*3/uL — ABNORMAL LOW (ref 150–400)
RBC: 2.52 MIL/uL — ABNORMAL LOW (ref 4.22–5.81)
RDW: 14.8 % (ref 11.5–15.5)
WBC: 4.1 10*3/uL (ref 4.0–10.5)
nRBC: 0 % (ref 0.0–0.2)

## 2020-12-18 LAB — RENAL FUNCTION PANEL
Albumin: 3.2 g/dL — ABNORMAL LOW (ref 3.5–5.0)
Anion gap: 8 (ref 5–15)
BUN: 28 mg/dL — ABNORMAL HIGH (ref 8–23)
CO2: 30 mmol/L (ref 22–32)
Calcium: 8.6 mg/dL — ABNORMAL LOW (ref 8.9–10.3)
Chloride: 98 mmol/L (ref 98–111)
Creatinine, Ser: 6.26 mg/dL — ABNORMAL HIGH (ref 0.61–1.24)
GFR, Estimated: 9 mL/min — ABNORMAL LOW (ref >60)
Glucose, Bld: 79 mg/dL (ref 70–99)
Phosphorus: 5.6 mg/dL — ABNORMAL HIGH (ref 2.5–4.6)
Potassium: 4.1 mmol/L (ref 3.5–5.1)
Sodium: 136 mmol/L (ref 135–145)

## 2020-12-18 LAB — BPAM RBC
Blood Product Expiration Date: 202204262359
ISSUE DATE / TIME: 202204050616
Unit Type and Rh: 6200

## 2020-12-18 LAB — TYPE AND SCREEN
ABO/RH(D): A POS
Antibody Screen: NEGATIVE
Unit division: 0

## 2020-12-18 MED ORDER — DEXTROSE 50 % IV SOLN
1.0000 | Freq: Once | INTRAVENOUS | Status: AC
  Administered 2020-12-18: 50 mL via INTRAVENOUS
  Filled 2020-12-18: qty 50

## 2020-12-18 MED ORDER — HYDRALAZINE HCL 20 MG/ML IJ SOLN
10.0000 mg | Freq: Once | INTRAMUSCULAR | Status: AC
  Administered 2020-12-18: 10 mg via INTRAVENOUS
  Filled 2020-12-18: qty 1

## 2020-12-18 MED ORDER — INSULIN ASPART 100 UNIT/ML ~~LOC~~ SOLN
0.0000 [IU] | SUBCUTANEOUS | Status: DC
Start: 2020-12-18 — End: 2020-12-20
  Administered 2020-12-18: 3 [IU] via SUBCUTANEOUS
  Administered 2020-12-19: 5 [IU] via SUBCUTANEOUS
  Administered 2020-12-19: 2 [IU] via SUBCUTANEOUS
  Administered 2020-12-19: 1 [IU] via SUBCUTANEOUS
  Administered 2020-12-19 (×2): 3 [IU] via SUBCUTANEOUS
  Administered 2020-12-19: 2 [IU] via SUBCUTANEOUS
  Administered 2020-12-20 (×2): 1 [IU] via SUBCUTANEOUS

## 2020-12-18 MED ORDER — DARBEPOETIN ALFA 60 MCG/0.3ML IJ SOSY
PREFILLED_SYRINGE | INTRAMUSCULAR | Status: AC
  Administered 2020-12-18: 60 ug via INTRAVENOUS
  Filled 2020-12-18: qty 0.3

## 2020-12-18 NOTE — Progress Notes (Signed)
[facility] Progress Note  Subjective: no c/o today  Vitals:   12/18/20 1000 12/18/20 1030 12/18/20 1100 12/18/20 1130  BP: (!) 159/53 (!) 138/48 (!) 113/56 (!) 118/46  Pulse: 66 61 62 (!) 59  Resp: 13 (!) '22 15 15  '$ Temp:      TempSrc:      SpO2: 100% 99% 99% 98%  Weight:        Exam:   alert, nad   no jvd  Chest cta bilat  Cor reg no RG  Abd soft ntnd no ascites   Ext no LE edema   Alert, NF, ox3    AVF+bruit    OP HD: MWF AF  3h 79mn  400/500   59.5kg  2/2 bath  AVF     Assessment/ Plan: 1. Symptomatic bradycardia: metoprolol on hold. HR 60's.  2. CAD/ hx atrial fib - eliquis on hold for anemia. NSR now.  3. Anemia - +FOB, Hb 8.3. GI evaluating. Possible EGD later today.  4. ESRD: usual HD MWF. Plan HD today.  5. Volume/ hypertension: BP's good, up 1.5 kg by wts, no vol ^ on exam.  6. Anemia ckd: sp prbc's here for Hb 6.8. Hb to 8.3 today. Currently receiving mircera 546m q 4wks, due for esa. Plan IV aranesp 6032mweekly on Tuesday (given 4/5). Hold weekly iron given transfusion prn.  7. MBD ckd: Sensipar 15m46m/ dialysis. Check phos  8. Vascular access: AVF issues being evaluated outpt per patient   Rob Kelly Splinter/2022, 12:01 PM   Recent Labs  Lab 12/17/20 0439 12/17/20 1143 12/18/20 0253  K 3.8  --  4.1  BUN 14  --  28*  CREATININE 3.67*  --  6.26*  CALCIUM 7.9*  --  8.6*  PHOS 2.9  --  5.6*  HGB 6.7* 8.2* 8.3*   Inpatient medications: . atorvastatin  80 mg Oral Daily  . Chlorhexidine Gluconate Cloth  6 each Topical Q0600  . cinacalcet  60 mg Oral Q M,W,F  . [START ON 12/25/2020] darbepoetin (ARANESP) injection - DIALYSIS  60 mcg Intravenous Q Wed-HD  . ezetimibe  10 mg Oral Daily  . insulin aspart  5 Units Intravenous Once  . insulin pump   Subcutaneous Q4H  . pantoprazole (PROTONIX) IV  40 mg Intravenous Q12H   . sodium chloride    . nitroGLYCERIN 15 mcg/min (12/18/20 0844)   acetaminophen

## 2020-12-18 NOTE — H&P (View-Only) (Signed)
[facility] Progress Note  CC:  Anemia, positive FOBT  Subjective: He is currently in dialysis. No CP today. No abdominal pain. No BM today. He remains NPO for potential EGD today.    Objective:  Vital signs in last 24 hours: Temp:  [98.2 F (36.8 C)-98.8 F (37.1 C)] 98.8 F (37.1 C) (04/06 0815) Pulse Rate:  [60-68] 62 (04/06 1100) Resp:  [12-22] 15 (04/06 1100) BP: (113-204)/(33-61) 113/56 (04/06 1100) SpO2:  [98 %-100 %] 99 % (04/06 1030) Weight:  [60.9 kg] 60.9 kg (04/06 0810) Last BM Date: 12/17/20 General:   Alert in NAD.  Heart: RRR, no murmur.  Pulm:  Breath sounds clear.  Abdomen: Soft, nondistended. + BS x 4 quads. No HSM.  Extremities:  Without edema. Neurologic:  Alert and  oriented x 4;  grossly normal neurologically. Psych:  Alert and cooperative. Normal mood and affect.  Intake/Output from previous day: 04/05 0701 - 04/06 0700 In: 356 [Blood:356] Out: -  Intake/Output this shift: Total I/O In: 93.9 [I.V.:93.9] Out: -   Lab Results: Recent Labs    12/16/20 1600 12/17/20 0439 12/17/20 1143 12/18/20 0253  WBC 5.0 3.8*  --  4.1  HGB 7.3* 6.7* 8.2* 8.3*  HCT 22.6* 20.2* 24.6* 24.4*  PLT 112* 104*  --  98*   BMET Recent Labs    12/16/20 0857 12/17/20 0439 12/18/20 0253  NA 136 135 136  K 5.0 3.8 4.1  CL 98 97* 98  CO2 29 32 30  GLUCOSE 128* 218* 79  BUN 32* 14 28*  CREATININE 6.04* 3.67* 6.26*  CALCIUM 8.7* 7.9* 8.6*   LFT Recent Labs    12/15/20 1636 12/16/20 0857 12/18/20 0253  PROT 5.7*  --   --   ALBUMIN 3.3*   < > 3.2*  AST 18  --   --   ALT 17  --   --   ALKPHOS 82  --   --   BILITOT 0.6  --   --    < > = values in this interval not displayed.   PT/INR Recent Labs    12/15/20 1636  LABPROT 15.3*  INR 1.3*   Hepatitis Panel No results for input(s): HEPBSAG, HCVAB, HEPAIGM, HEPBIGM in the last 72 hours.  No results found.  Assessment / Plan:  36. 65 year old male admitted to the  hospitaldizziness, CP and vomiting (nonbloody emesis)with anemia and + FOBT. No upper or lower abdominal pain.Hg 7.1 ->6.8 (baseline Hg 10.0 on 12/12/2019). Hg 7.4 -> 6.7.  BUN 14. Transfused 1 unit of PRBCs. Post transfusion Hg 8.2. Today Hg 8.3.  -Dr. Tarri Glenn to verify if EGD to be done this afternoon or tomorrow. Patient is currently in dialysis. He remains NPO. -Continue to Hold Eliquis  -PPI IV bid -Monitor H/H closely -Transfuse for Hg < 7 -Nursing staff to monitor BM color and to document any black stools or rectal bleeding   2. History of atrial fibrillation.CHA2DS2 vascscore 4.Eliquis on hold.Currently in NSR.  3. CAD. Bradycardia. Metoprolol on hold. HR in the 60s. Chest pain this am. Troponin 745. On NGT gtt.   4. Diastolic CHF. BNP 1,991.6  5. Thrombocytopenia. PLT 104.Normal LFTs.   6. CKD on HD. Cr 6.04. BUN 32.Last dialysis session was this am.    7. Family (father) with history of colon cancer   8. DM on insulin     Active Problems:   ESRD (end stage renal disease) ([place])   CAD (coronary  artery disease)   Chest pain   Gastroesophageal reflux disease   Chronic diastolic CHF (congestive heart failure) (HCC)   Hyperkalemia   Hyperlipidemia   AF (paroxysmal atrial fibrillation) (HCC)   Symptomatic bradycardia   Postural dizziness with presyncope   Symptomatic anemia   Blood in stool     LOS: 3 days   Noralyn Pick  12/18/2020, 11:43 AM

## 2020-12-18 NOTE — Progress Notes (Signed)
Patient ID: Kenneth [name], male   DOB: 1956/07/25, 65 y.o.   MRN: [id number]  PROGRESS NOTE    Kenneth [name]  [name] DOB: Dec 26, 1955 DOA: 12/15/2020 PCP: [name], MD   Brief Narrative:  65 y.o.malewith medical history significant of HLD, HTN, DM1, A.fib, ESRD on HD, CAD sp CABG presented with CP tightness and weakness along with intermittent bloody stools.  On EMS arrival, patient was bradycardic into the 40s and poorly responsive for which she was given 1 mg of atropine and heart rate went up to 40 to 60s.  In the ED, potassium was 6.2, EKG showed junctional rhythm; he was given Lokelma, glucose and insulin.  Nephrology is consulted and he subsequently had HD.  He also was found to have hemoglobin drop of 2 units with Hemoccult positive stool.  GI was consulted.  Assessment & Plan:   Symptomatic bradycardia -Status post atropine on presentation.  Heart rate in the 60s to 70s currently.  Beta-blocker on hold. -EKG showed no acute ST changes -Echo in 3/22 showed EF of 55 to 60% with grade 2 diastolic dysfunction -Cardiology following  Possible non-STEMI versus demand ischemia in a patient with history of CAD status post CABG Hyperlipidemia -Troponin peaked up to more than 700.  EKG with no acute ST changes.  Cardiology following.  Continue statin and Zetia.  Imdur, losartan and metoprolol on hold.  Currently on nitroglycerin drip as per cardiology: Plans for cardiac cath once cleared by GI  GI bleeding Anemia of chronic kidney disease -Baseline hemoglobin around 9-10.  Hemoglobin dropped to 6.7.  Status post 1 unit packed red cell transfusion on 12/17/2020.  Hemoglobin 8.3 today -Continue PPI IV twice a day.  GI following.  Eliquis on hold  End-stage renal disease on hemodialysis Hyperkalemia: Resolved -Nephrology following.  Dialysis as per nephrology schedule  Chronic diastolic heart failure -Volume management as per hemodialysis  Diabetes mellitus type 1 with  hypoglycemia -A1c 5.9.  Hold insulin pump for now.  Diabetes coordinator following.  Continue CBG checks  Paroxysmal A. Fib -Currently rate controlled.  Metoprolol and Eliquis on hold  Hypertension -Blood pressure intermittently elevated.  Might have to resume antihypertensives.  Generalized weakness--PT/OT eval   DVT prophylaxis: SCDs Code Status: Full Family Communication: None at bedside Disposition Plan: Status is: Inpatient  Remains inpatient appropriate because:Inpatient level of care appropriate due to severity of illness   Dispo: The patient is from: Home              Anticipated d/c is to: Home              Patient currently is not medically stable to d/c.   Difficult to place patient No  Consultants: Nephrology/GI/cardiology  Procedures: None  Antimicrobials: None   Subjective: Patient seen and examined at bedside undergoing dialysis.  Poor historian.  Wakes up slightly, does not participate in conversation much.  No overnight fever, vomiting reported.  Objective: Vitals:   12/18/20 0830 12/18/20 0900 12/18/20 0930 12/18/20 1000  BP:  (!) 153/55 (!) 151/54 (!) 159/53  Pulse: 67 63 64 66  Resp: '15 15 15 13  '$ Temp:      TempSrc:      SpO2: 99% 98% 98% 100%  Weight:        Intake/Output Summary (Last 24 hours) at 12/18/2020 1029 Last data filed at 12/18/2020 0844 Gross per 24 hour  Intake 93.87 ml  Output --  Net 93.87 ml   [employer]  12/18/20 0810  Weight: 60.9 kg    Examination:  General exam: Appears calm and comfortable.  Poor historian.  Currently on room air Respiratory system: Bilateral decreased breath sounds at bases Cardiovascular system: S1 & S2 heard, Rate controlled Gastrointestinal system: Abdomen is nondistended, soft and nontender. Normal bowel sounds heard. Extremities: No cyanosis, clubbing; trace lower extremity edema Central nervous system: Wakes up slightly, does not participate in conversation much. No focal neurological  deficits. Moving extremities Skin: No rashes, lesions or ulcers Psychiatry: Could not be assessed because of mental status   Data Reviewed: I have personally reviewed following labs and imaging studies  CBC: Recent Labs  Lab 12/15/20 1636 12/15/20 1707 12/16/20 0857 12/16/20 1600 12/17/20 0439 12/17/20 1143 12/18/20 0253  WBC 4.5  --  4.5 5.0 3.8*  --  4.1  NEUTROABS 2.8  --  2.9  --  2.3  --  2.2  HGB 7.1*   < > 7.4* 7.3* 6.7* 8.2* 8.3*  HCT 21.9*   < > 22.1* 22.6* 20.2* 24.6* 24.4*  MCV 100.9*  --  98.7 100.0 98.5  --  96.8  PLT 97*  --  103* 112* 104*  --  98*   < > = values in this interval not displayed.   Basic Metabolic Panel: Recent Labs  Lab 12/15/20 1636 12/15/20 1707 12/15/20 1708 12/16/20 0406 12/16/20 0857 12/17/20 0439 12/18/20 0253  NA 132* 131* 131*  --  136 135 136  K 6.2* 6.2* 6.2*  --  5.0 3.8 4.1  CL 94* 96*  --   --  98 97* 98  CO2 26  --   --   --  29 32 30  GLUCOSE 345* 329*  --   --  128* 218* 79  BUN 67* 75*  --   --  32* 14 28*  CREATININE 9.40* 9.70*  --   --  6.04* 3.67* 6.26*  CALCIUM 7.8*  --   --   --  8.7* 7.9* 8.6*  PHOS  --   --   --  4.2 4.3 2.9 5.6*   GFR: CrCl cannot be calculated (Unknown ideal weight.). Liver Function Tests: Recent Labs  Lab 12/15/20 1636 12/16/20 0857 12/17/20 0439 12/18/20 0253  AST 18  --   --   --   ALT 17  --   --   --   ALKPHOS 82  --   --   --   BILITOT 0.6  --   --   --   PROT 5.7*  --   --   --   ALBUMIN 3.3* 3.2* 3.1* 3.2*   No results for input(s): LIPASE, AMYLASE in the last 168 hours. No results for input(s): AMMONIA in the last 168 hours. Coagulation Profile: Recent Labs  Lab 12/15/20 1636  INR 1.3*   Cardiac Enzymes: No results for input(s): CKTOTAL, CKMB, CKMBINDEX, TROPONINI in the last 168 hours. BNP (last 3 results) No results for input(s): PROBNP in the last 8760 hours. HbA1C: Recent Labs    12/16/20 1600  HGBA1C 5.9*   CBG: Recent Labs  Lab 12/18/20 0000  12/18/20 0452 12/18/20 0513 12/18/20 0613 12/18/20 0739  GLUCAP 121* 67* 255* 158* 146*   Lipid Profile: No results for input(s): CHOL, HDL, LDLCALC, TRIG, CHOLHDL, LDLDIRECT in the last 72 hours. Thyroid Function Tests: No results for input(s): TSH, T4TOTAL, FREET4, T3FREE, THYROIDAB in the last 72 hours. Anemia Panel: Recent Labs    12/16/20 1600  VITAMINB12 304  FOLATE 6.7  FERRITIN 1,038*  TIBC 262  IRON 72   Sepsis Labs: No results for input(s): PROCALCITON, LATICACIDVEN in the last 168 hours.  Recent Results (from the past 240 hour(s))  Resp Panel by RT-PCR (Flu A&B, Covid) Nasopharyngeal Swab     Status: None   Collection Time: 12/15/20  8:57 PM   Specimen: Nasopharyngeal Swab; Nasopharyngeal(NP) swabs in vial transport medium  Result Value Ref Range Status   SARS Coronavirus 2 by RT PCR NEGATIVE NEGATIVE Final    Comment: (NOTE) SARS-CoV-2 target nucleic acids are NOT DETECTED.  The SARS-CoV-2 RNA is generally detectable in upper respiratory specimens during the acute phase of infection. The lowest concentration of SARS-CoV-2 viral copies this assay can detect is 138 copies/mL. A negative result does not preclude SARS-Cov-2 infection and should not be used as the sole basis for treatment or other patient management decisions. A negative result may occur with  improper specimen collection/handling, submission of specimen other than nasopharyngeal swab, presence of viral mutation(s) within the areas targeted by this assay, and inadequate number of viral copies(<138 copies/mL). A negative result must be combined with clinical observations, patient history, and epidemiological information. The expected result is Negative.  Fact Sheet for Patients:  EntrepreneurPulse.com.au  Fact Sheet for Healthcare Providers:  IncredibleEmployment.be  This test is no t yet approved or cleared by the [place] FDA and  has been authorized  for detection and/or diagnosis of SARS-CoV-2 by FDA under an Emergency Use Authorization (EUA). This EUA will remain  in effect (meaning this test can be used) for the duration of the COVID-19 declaration under Section 564(b)(1) of the Act, 21 U.S.C.section 360bbb-3(b)(1), unless the authorization is terminated  or revoked sooner.       Influenza A by PCR NEGATIVE NEGATIVE Final   Influenza B by PCR NEGATIVE NEGATIVE Final    Comment: (NOTE) The Xpert Xpress SARS-CoV-2/FLU/RSV plus assay is intended as an aid in the diagnosis of influenza from Nasopharyngeal swab specimens and should not be used as a sole basis for treatment. Nasal washings and aspirates are unacceptable for Xpert Xpress SARS-CoV-2/FLU/RSV testing.  Fact Sheet for Patients: EntrepreneurPulse.com.au  Fact Sheet for Healthcare Providers: IncredibleEmployment.be  This test is not yet approved or cleared by the [place] FDA and has been authorized for detection and/or diagnosis of SARS-CoV-2 by FDA under an Emergency Use Authorization (EUA). This EUA will remain in effect (meaning this test can be used) for the duration of the COVID-19 declaration under Section 564(b)(1) of the Act, 21 U.S.C. section 360bbb-3(b)(1), unless the authorization is terminated or revoked.  Performed at [facility], [place] [address] [place], [place] [address]          Radiology Studies: No results found.      Scheduled Meds: . atorvastatin  80 mg Oral Daily  . Chlorhexidine Gluconate Cloth  6 each Topical Q0600  . cinacalcet  60 mg Oral Q M,W,F  . [START ON 12/25/2020] darbepoetin (ARANESP) injection - DIALYSIS  60 mcg Intravenous Q Wed-HD  . ezetimibe  10 mg Oral Daily  . insulin aspart  5 Units Intravenous Once  . insulin pump   Subcutaneous Q4H  . pantoprazole (PROTONIX) IV  40 mg Intravenous Q12H   Continuous Infusions: . sodium chloride    . nitroGLYCERIN 15 mcg/min  (12/18/20 0844)          Aline August, MD [facility] 12/18/2020, 10:29 AM

## 2020-12-18 NOTE — Plan of Care (Signed)

## 2020-12-18 NOTE — Progress Notes (Signed)
Progress Note  Patient Name: Kenneth [name] Date of Encounter: 12/18/2020  [facility] Cardiologist: No primary care provider on file.   Subjective   Seen in HD this morning. Denies chest pain.   Hemoglobin 8.3 this AM; stable from post-transfusion H/H yesterday which was 8.2 Trop 745-->735  Inpatient Medications    Scheduled Meds: . atorvastatin  80 mg Oral Daily  . Chlorhexidine Gluconate Cloth  6 each Topical Q0600  . cinacalcet  60 mg Oral Q M,W,F  . [START ON 12/25/2020] darbepoetin (ARANESP) injection - DIALYSIS  60 mcg Intravenous Q Wed-HD  . ezetimibe  10 mg Oral Daily  . insulin aspart  5 Units Intravenous Once  . insulin pump   Subcutaneous Q4H  . pantoprazole (PROTONIX) IV  40 mg Intravenous Q12H   Continuous Infusions: . sodium chloride    . nitroGLYCERIN 50 mcg/min (12/17/20 2116)   PRN Meds: acetaminophen   Vital Signs    Vitals:   12/18/20 0002 12/18/20 0455 12/18/20 0515 12/18/20 0549  BP: (!) 155/57  (!) 204/61 (!) 162/54  Pulse:      Resp: 20   13  Temp: 98.2 F (36.8 C)     TempSrc: Oral     SpO2: 100% 100%  100%    Intake/Output Summary (Last 24 hours) at 12/18/2020 0740 Last data filed at 12/17/2020 0925 Gross per 24 hour  Intake 356 ml  Output --  Net 356 ml   Last 3 Weights 11/28/2020 10/31/2020 01/23/2020  Weight (lbs) 133 lb 137 lb 12.8 oz 137 lb 9.6 oz  Weight (kg) 60.328 kg 62.506 kg 62.415 kg      Telemetry    NSR HR 60-70s - Personally Reviewed  ECG    No new tracing - Personally Reviewed  Physical Exam   GEN: No acute distress.  Laying in bed receiving HD Neck: No JVD Cardiac: RRR, soft systolic murmur. No rubs, or gallops.  Respiratory: Clear to auscultation bilaterally. GI: Soft, nontender, non-distended  MS: No edema; No deformity. Neuro:  Nonfocal  Psych: Normal affect   Labs    High Sensitivity Troponin:   Recent Labs  Lab 12/16/20 0627 12/16/20 1706 12/16/20 2334 12/17/20 0439 12/17/20 0623   TROPONINIHS 48* 137* 562* 745* 735*      Chemistry Recent Labs  Lab 12/15/20 1636 12/15/20 1707 12/16/20 0857 12/17/20 0439 12/18/20 0253  NA 132*   < > 136 135 136  K 6.2*   < > 5.0 3.8 4.1  CL 94*   < > 98 97* 98  CO2 26  --  29 32 30  GLUCOSE 345*   < > 128* 218* 79  BUN 67*   < > 32* 14 28*  CREATININE 9.40*   < > 6.04* 3.67* 6.26*  CALCIUM 7.8*  --  8.7* 7.9* 8.6*  PROT 5.7*  --   --   --   --   ALBUMIN 3.3*  --  3.2* 3.1* 3.2*  AST 18  --   --   --   --   ALT 17  --   --   --   --   ALKPHOS 82  --   --   --   --   BILITOT 0.6  --   --   --   --   GFRNONAA 6*  --  10* 18* 9*  ANIONGAP 12  --  '9 6 8   '$ < > = values in this interval not displayed.  Hematology Recent Labs  Lab 12/16/20 1600 12/17/20 0439 12/17/20 1143 12/18/20 0253  WBC 5.0 3.8*  --  4.1  RBC 2.26* 2.05*  --  2.52*  HGB 7.3* 6.7* 8.2* 8.3*  HCT 22.6* 20.2* 24.6* 24.4*  MCV 100.0 98.5  --  96.8  MCH 32.3 32.7  --  32.9  MCHC 32.3 33.2  --  34.0  RDW 15.5 15.2  --  14.8  PLT 112* 104*  --  98*    BNP Recent Labs  Lab 12/16/20 0406  BNP 1,991.6*     DDimer No results for input(s): DDIMER in the last 168 hours.   Radiology    No results found.  Cardiac Studies   TTE 11/28/20: 1. Left ventricular ejection fraction, by estimation, is 55 to 60%. The  left ventricle has normal function. The left ventricle has no regional  wall motion abnormalities. There is moderate left ventricular hypertrophy.  Left ventricular diastolic  parameters are consistent with Grade II diastolic dysfunction  (pseudonormalization). Elevated left atrial pressure.  2. Right ventricular systolic function is normal. The right ventricular  size is normal.  3. The mitral valve is normal in structure. Trivial mitral valve  regurgitation. No evidence of mitral stenosis.  4. The aortic valve is tricuspid. Aortic valve regurgitation is trivial.  Mild aortic valve sclerosis is present, with no evidence of  aortic valve  stenosis.  5. The inferior vena cava is normal in size with greater than 50%  respiratory variability, suggesting right atrial pressure of 3 mmHg.   Cath 04/17/2019: 1. Occluded SVG-RCA and SVG-ramus, patent LIMA-LAD.  2. The large ramus was patent with 40-50% mid ramus stenosis (in-stent restenosis).  3. 80% ostial stenosis very small caliber AV LCx. Not interventional target.  4. Occluded RCA (known from prior) with collaterals.   No target for intervention. Aggressive medical management of angina.  Myoview 11/15/18:  Baseline EKG showed NSR with LVH and repolarization abnormality in the inferolateral leads. No change in ST segments during Lexiscan infusion.  Nuclear stress EF: 54%.  The left ventricular ejection fraction is mildly decreased (45-54%).  There is a small defect of mild severity present in the basal inferior and mid inferior location. The defect is non-reversible and in the setting of normal LVF is consistent with diaphragmatic attenuation artifact. No ischemia noted.  This is a low risk study.   Patient Profile     65 y.o. male with a hx of HTN, HLD, DMII, Afib, ESRD on HD, CAD s/p CABGwith multiple subsequent PCIs who presented with chest tightness found to be bradycardic to 40s in the setting of BB use responsive to atropine with course complicated by trop rise 48-->745. Cardiology has now been consulted for management of bradycardia and NSTEMI.  Assessment & Plan     #NSTEMI #CAD s/p CABG with multiple subsequent PCIs and caths: Patient with extensive history of CAD s/p CABG in 2017 with several subsequent NSTEMIs/PCIs with last in 2020. Presented with bradycardia and chest tightness with initial troponins in 40s. Trop rose overnight from 100-->500s-->745 this AM. Currently with continued substernal chest tightness. Notably, this rise correlates with drop in hemoglobin with concern for GIB suggesting element of demand in the setting of known  significant CAD. Will need to stabilize from GIB standpoint first prior to proceeding with further work-up of NSTEMI. -Manage GIB and anemia per GI and primary team -Holding antiplatelet agents and AC due to GIB; resume once able -Continue nitro gtt -Continue lipitor '80mg'$  daily  and zetia '10mg'$  daily -Holding ARB due to bleed and metop due to bradycardia -Once stable from GIB standpoint, likely will need repeat coronary angiography given acute rise in troponin and known significant CAD  #Symptomatic Bradycardia: Improved s/p holding metop. Remains in NSR today without significant ectopy -Stopped metop -Monitor on telemetry  #Chronic Diastolic Heart Failure: LVEF 55-60%, moderate LVH, G2DD.Does not appear acutely decompensated. -Continue volume manangement with HD -Hold metop due to symptomatic bradycardia -Holding losartan due to GIB -Follows with HF clinic  #Acute on chronic anemia #GIB: Received 1 unit pRBC yesterday with hemoglobin 6.7-->8.2-->8.3 -Management per GI -Holding eliquis -Will need to be stabilized from GI standpoint prior to proceeding with ischemic work-up  #Atrial Fibrillation: CHADs-vasc 4 -Holding eliquis given GIB as above -Hold BB due to symptomatic bradycardia as above  #HTN: -Currently being held due to concern for GIB and recent bradycardia episode; resume once more clinically stable  #HLD: LDL 52. -Resume atorvastatin '80mg'$  daily and zetia '10mg'$  daily  #Carotid Stenosis: Mild on recent carotid ultrasound. -Continue statin and zetia as above  #ESRD: -Continue scheduled HD  #DMII: -On insulin      For questions or updates, please contact [place] Please consult [email or web address] for contact info under        Signed, Freada Bergeron, MD  12/18/2020, 7:40 AM

## 2020-12-18 NOTE — Progress Notes (Signed)
[facility] Progress Note  CC:  Anemia, positive FOBT  Subjective: He is currently in dialysis. No CP today. No abdominal pain. No BM today. He remains NPO for potential EGD today.    Objective:  Vital signs in last 24 hours: Temp:  [98.2 F (36.8 C)-98.8 F (37.1 C)] 98.8 F (37.1 C) (04/06 0815) Pulse Rate:  [60-68] 62 (04/06 1100) Resp:  [12-22] 15 (04/06 1100) BP: (113-204)/(33-61) 113/56 (04/06 1100) SpO2:  [98 %-100 %] 99 % (04/06 1030) Weight:  [60.9 kg] 60.9 kg (04/06 0810) Last BM Date: 12/17/20 General:   Alert in NAD.  Heart: RRR, no murmur.  Pulm:  Breath sounds clear.  Abdomen: Soft, nondistended. + BS x 4 quads. No HSM.  Extremities:  Without edema. Neurologic:  Alert and  oriented x 4;  grossly normal neurologically. Psych:  Alert and cooperative. Normal mood and affect.  Intake/Output from previous day: 04/05 0701 - 04/06 0700 In: 356 [Blood:356] Out: -  Intake/Output this shift: Total I/O In: 93.9 [I.V.:93.9] Out: -   Lab Results: Recent Labs    12/16/20 1600 12/17/20 0439 12/17/20 1143 12/18/20 0253  WBC 5.0 3.8*  --  4.1  HGB 7.3* 6.7* 8.2* 8.3*  HCT 22.6* 20.2* 24.6* 24.4*  PLT 112* 104*  --  98*   BMET Recent Labs    12/16/20 0857 12/17/20 0439 12/18/20 0253  NA 136 135 136  K 5.0 3.8 4.1  CL 98 97* 98  CO2 29 32 30  GLUCOSE 128* 218* 79  BUN 32* 14 28*  CREATININE 6.04* 3.67* 6.26*  CALCIUM 8.7* 7.9* 8.6*   LFT Recent Labs    12/15/20 1636 12/16/20 0857 12/18/20 0253  PROT 5.7*  --   --   ALBUMIN 3.3*   < > 3.2*  AST 18  --   --   ALT 17  --   --   ALKPHOS 82  --   --   BILITOT 0.6  --   --    < > = values in this interval not displayed.   PT/INR Recent Labs    12/15/20 1636  LABPROT 15.3*  INR 1.3*   Hepatitis Panel No results for input(s): HEPBSAG, HCVAB, HEPAIGM, HEPBIGM in the last 72 hours.  No results found.  Assessment / Plan:  86. 65 year old male admitted to the  hospitaldizziness, CP and vomiting (nonbloody emesis)with anemia and + FOBT. No upper or lower abdominal pain.Hg 7.1 ->6.8 (baseline Hg 10.0 on 12/12/2019). Hg 7.4 -> 6.7.  BUN 14. Transfused 1 unit of PRBCs. Post transfusion Hg 8.2. Today Hg 8.3.  -Dr. Tarri Glenn to verify if EGD to be done this afternoon or tomorrow. Patient is currently in dialysis. He remains NPO. -Continue to Hold Eliquis  -PPI IV bid -Monitor H/H closely -Transfuse for Hg < 7 -Nursing staff to monitor BM color and to document any black stools or rectal bleeding   2. History of atrial fibrillation.CHA2DS2 vascscore 4.Eliquis on hold.Currently in NSR.  3. CAD. Bradycardia. Metoprolol on hold. HR in the 60s. Chest pain this am. Troponin 745. On NGT gtt.   4. Diastolic CHF. BNP 1,991.6  5. Thrombocytopenia. PLT 104.Normal LFTs.   6. CKD on HD. Cr 6.04. BUN 32.Last dialysis session was this am.    7. Family (father) with history of colon cancer   8. DM on insulin     Active Problems:   ESRD (end stage renal disease) ([facility])   CAD (coronary  artery disease)   Chest pain   Gastroesophageal reflux disease   Chronic diastolic CHF (congestive heart failure) ([facility])   Hyperkalemia   Hyperlipidemia   AF (paroxysmal atrial fibrillation) ([facility])   Symptomatic bradycardia   Postural dizziness with presyncope   Symptomatic anemia   Blood in stool     LOS: 3 days   Noralyn Pick  12/18/2020, 11:43 AM

## 2020-12-19 ENCOUNTER — Encounter (HOSPITAL_COMMUNITY): Payer: Self-pay | Admitting: Internal Medicine

## 2020-12-19 ENCOUNTER — Other Ambulatory Visit: Payer: Self-pay

## 2020-12-19 ENCOUNTER — Inpatient Hospital Stay (HOSPITAL_COMMUNITY): Payer: Medicare Other | Admitting: Anesthesiology

## 2020-12-19 ENCOUNTER — Encounter (HOSPITAL_COMMUNITY)
Admission: EM | Disposition: A | Discharge status: Home / Self Care | Payer: Self-pay | Source: Home / Self Care | Attending: Internal Medicine

## 2020-12-19 DIAGNOSIS — R195 Other fecal abnormalities: Secondary | ICD-10-CM

## 2020-12-19 DIAGNOSIS — I480 Paroxysmal atrial fibrillation: Secondary | ICD-10-CM | POA: Diagnosis not present

## 2020-12-19 DIAGNOSIS — I25118 Atherosclerotic heart disease of native coronary artery with other forms of angina pectoris: Secondary | ICD-10-CM | POA: Diagnosis not present

## 2020-12-19 DIAGNOSIS — N186 End stage renal disease: Secondary | ICD-10-CM | POA: Diagnosis not present

## 2020-12-19 DIAGNOSIS — I5032 Chronic diastolic (congestive) heart failure: Secondary | ICD-10-CM | POA: Diagnosis not present

## 2020-12-19 DIAGNOSIS — K3189 Other diseases of stomach and duodenum: Secondary | ICD-10-CM | POA: Diagnosis not present

## 2020-12-19 DIAGNOSIS — D500 Iron deficiency anemia secondary to blood loss (chronic): Secondary | ICD-10-CM

## 2020-12-19 DIAGNOSIS — R001 Bradycardia, unspecified: Secondary | ICD-10-CM | POA: Diagnosis not present

## 2020-12-19 HISTORY — PX: ESOPHAGOGASTRODUODENOSCOPY (EGD) WITH PROPOFOL: SHX5813

## 2020-12-19 HISTORY — PX: BIOPSY: SHX5522

## 2020-12-19 LAB — RENAL FUNCTION PANEL
Albumin: 3.5 g/dL (ref 3.5–5.0)
Anion gap: 8 (ref 5–15)
BUN: 25 mg/dL — ABNORMAL HIGH (ref 8–23)
CO2: 28 mmol/L (ref 22–32)
Calcium: 8.2 mg/dL — ABNORMAL LOW (ref 8.9–10.3)
Chloride: 97 mmol/L — ABNORMAL LOW (ref 98–111)
Creatinine, Ser: 5.08 mg/dL — ABNORMAL HIGH (ref 0.61–1.24)
GFR, Estimated: 12 mL/min — ABNORMAL LOW (ref >60)
Glucose, Bld: 183 mg/dL — ABNORMAL HIGH (ref 70–99)
Phosphorus: 4.1 mg/dL (ref 2.5–4.6)
Potassium: 4.7 mmol/L (ref 3.5–5.1)
Sodium: 133 mmol/L — ABNORMAL LOW (ref 135–145)

## 2020-12-19 LAB — GLUCOSE, CAPILLARY
Glucose-Capillary: 167 mg/dL — ABNORMAL HIGH (ref 70–99)
Glucose-Capillary: 151 mg/dL — ABNORMAL HIGH (ref 70–99)
Glucose-Capillary: 250 mg/dL — ABNORMAL HIGH (ref 70–99)
Glucose-Capillary: 290 mg/dL — ABNORMAL HIGH (ref 70–99)
Glucose-Capillary: 204 mg/dL — ABNORMAL HIGH (ref 70–99)

## 2020-12-19 LAB — CBC WITH DIFFERENTIAL/PLATELET
Abs Immature Granulocytes: 0.01 10*3/uL (ref 0.00–0.07)
Basophils Absolute: 0 10*3/uL (ref 0.0–0.1)
Basophils Relative: 1 %
Eosinophils Absolute: 0.2 10*3/uL (ref 0.0–0.5)
Eosinophils Relative: 4 %
HCT: 26.6 % — ABNORMAL LOW (ref 39.0–52.0)
Hemoglobin: 8.9 g/dL — ABNORMAL LOW (ref 13.0–17.0)
Immature Granulocytes: 0 %
Lymphocytes Relative: 22 %
Lymphs Abs: 0.9 10*3/uL (ref 0.7–4.0)
MCH: 32 pg (ref 26.0–34.0)
MCHC: 33.5 g/dL (ref 30.0–36.0)
MCV: 95.7 fL (ref 80.0–100.0)
Monocytes Absolute: 0.6 10*3/uL (ref 0.1–1.0)
Monocytes Relative: 14 %
Neutro Abs: 2.5 10*3/uL (ref 1.7–7.7)
Neutrophils Relative %: 59 %
Platelets: 129 10*3/uL — ABNORMAL LOW (ref 150–400)
RBC: 2.78 MIL/uL — ABNORMAL LOW (ref 4.22–5.81)
RDW: 14.6 % (ref 11.5–15.5)
WBC: 4.3 10*3/uL (ref 4.0–10.5)
nRBC: 0 % (ref 0.0–0.2)

## 2020-12-19 SURGERY — ESOPHAGOGASTRODUODENOSCOPY (EGD) WITH PROPOFOL
Anesthesia: Monitor Anesthesia Care

## 2020-12-19 MED ORDER — ALTEPLASE 2 MG IJ SOLR
2.0000 mg | Freq: Once | INTRAMUSCULAR | Status: DC | PRN
  Filled 2020-12-19: qty 2

## 2020-12-19 MED ORDER — PEG-KCL-NACL-NASULF-NA ASC-C 100 G PO SOLR
0.5000 | Freq: Once | ORAL | Status: AC
  Administered 2020-12-19: 100 g via ORAL

## 2020-12-19 MED ORDER — PEG-KCL-NACL-NASULF-NA ASC-C 100 G PO SOLR: 1.0000 | Freq: Once | ORAL | Status: DC

## 2020-12-19 MED ORDER — PENTAFLUOROPROP-TETRAFLUOROETH EX AERO: 1.0000 "application " | INHALATION_SPRAY | CUTANEOUS | Status: DC | PRN

## 2020-12-19 MED ORDER — PROPOFOL 500 MG/50ML IV EMUL
INTRAVENOUS | Status: DC | PRN
  Administered 2020-12-19: 100 ug/kg/min via INTRAVENOUS

## 2020-12-19 MED ORDER — LIDOCAINE 2% (20 MG/ML) 5 ML SYRINGE
INTRAMUSCULAR | Status: DC | PRN
  Administered 2020-12-19: 40 mg via INTRAVENOUS

## 2020-12-19 MED ORDER — SODIUM CHLORIDE 0.9 % IV SOLN: 100.0000 mL | INTRAVENOUS | Status: DC | PRN

## 2020-12-19 MED ORDER — HYDRALAZINE HCL 20 MG/ML IJ SOLN
10.0000 mg | Freq: Once | INTRAMUSCULAR | Status: AC
  Administered 2020-12-19: 10 mg via INTRAVENOUS
  Filled 2020-12-19: qty 1

## 2020-12-19 MED ORDER — AMLODIPINE BESYLATE 5 MG PO TABS
5.0000 mg | ORAL_TABLET | Freq: Every day | ORAL | Status: DC
  Administered 2020-12-19: 5 mg via ORAL

## 2020-12-19 MED ORDER — HEPARIN SODIUM (PORCINE) 1000 UNIT/ML DIALYSIS
1000.0000 [IU] | INTRAMUSCULAR | Status: DC | PRN
  Filled 2020-12-19: qty 1

## 2020-12-19 MED ORDER — LIDOCAINE HCL (PF) 1 % IJ SOLN: 5.0000 mL | INTRAMUSCULAR | Status: DC | PRN

## 2020-12-19 MED ORDER — PEG-KCL-NACL-NASULF-NA ASC-C 100 G PO SOLR
0.5000 | Freq: Once | ORAL | Status: AC
  Administered 2020-12-19: 100 g via ORAL
  Filled 2020-12-19: qty 1

## 2020-12-19 MED ORDER — SODIUM CHLORIDE 0.9 % IV SOLN: INTRAVENOUS | Status: DC

## 2020-12-19 MED ORDER — PROPOFOL 10 MG/ML IV BOLUS
INTRAVENOUS | Status: DC | PRN
  Administered 2020-12-19: 20 mg via INTRAVENOUS
  Administered 2020-12-19: 10 mg via INTRAVENOUS

## 2020-12-19 MED ORDER — CALCIUM ACETATE (PHOS BINDER) 667 MG PO CAPS
667.0000 mg | ORAL_CAPSULE | Freq: Three times a day (TID) | ORAL | Status: DC
  Administered 2020-12-19 – 2020-12-25 (×15): 667 mg via ORAL
  Filled 2020-12-19 (×15): qty 1

## 2020-12-19 MED ORDER — LIDOCAINE-PRILOCAINE 2.5-2.5 % EX CREA
1.0000 "application " | TOPICAL_CREAM | CUTANEOUS | Status: DC | PRN
  Filled 2020-12-19: qty 5

## 2020-12-19 SURGICAL SUPPLY — 15 items

## 2020-12-19 NOTE — Anesthesia Preprocedure Evaluation (Addendum)
Anesthesia Evaluation  Patient identified by MRN, date of birth, ID band Patient awake    Reviewed: Allergy & Precautions, NPO status , Patient's Chart, lab work & pertinent test results  Airway Mallampati: I  TM Distance: >3 FB Neck ROM: Full    Dental no notable dental hx. (+) Teeth Intact, Dental Advisory Given   Pulmonary former smoker,    Pulmonary exam normal breath sounds clear to auscultation       Cardiovascular hypertension, Pt. on medications + CAD, + Past MI and + Cardiac Stents  Normal cardiovascular exam Rhythm:Regular Rate:Normal     Neuro/Psych PSYCHIATRIC DISORDERS Depression Carotid US 09/15/18: Summary: -Right Carotid: Non-hemodynamically significant plaque <50% noted in the CCA. The ECA appears >50% stenosed. The extracranial vessels were near-normal with only minimal wall thickening or plaque. -Left Carotid: Velocities in the left ICA are consistent with a 1-39% stenosis. Non-hemodynamically significant plaque noted in the CCA. -Vertebrals: Left vertebral artery demonstrates antegrade flow. Right vertebral artery demonstrates probable occlusion. -Subclavians: Normal flow hemodynamics were seen in the right subclavian artery. The left subclavian artery has normal low resistance and turbulent flow due to AVF.    GI/Hepatic GERD  Medicated and Controlled,  Endo/Other  diabetes  Renal/GU ESRF and DialysisRenal disease     Musculoskeletal   Abdominal   Peds  Hematology  (+) anemia , Lab Results      Component                Value               Date                      WBC                      4.3                 12/19/2020                HGB                      8.9 (L)             12/19/2020                HCT                      26.6 (L)            12/19/2020                MCV                      95.7                 12/19/2020                PLT                      129 (L)             12/19/2020              Anesthesia Other Findings   Reproductive/Obstetrics                            Anesthesia Physical  Anesthesia Plan  ASA: III  Anesthesia Plan:  MAC   Post-op Pain Management:    Induction: Intravenous  PONV Risk Score and Plan: 1 and Propofol infusion  Airway Management Planned: Nasal Cannula and Natural Airway  Additional Equipment:   Intra-op Plan:   Post-operative Plan:   Informed Consent: I have reviewed the patients History and Physical, chart, labs and discussed the procedure including the risks, benefits and alternatives for the proposed anesthesia with the patient or authorized representative who has indicated his/her understanding and acceptance.       Plan Discussed with: CRNA, Surgeon and Anesthesiologist  Anesthesia Plan Comments: (PAT note written 09/06/2019 by Myra Gianotti, PA-C. EKG:08/27/19: Sinus rhythm Probable left atrial enlargement LVH with secondary repolarization abnormality Abnormal inferior Q waves Anterior ST elevation, probably due to LVH Borderline prolonged QT interval Confirmed by Sherwood Gambler [phone]) on 08/27/2019 1:23:41 PM  CV:  Cardiac cath 04/17/19: Left Anterior Descending: 90% proximal LAD stenosis, subtotal occlusion of the mid LAD.  LIMA-LAD is patent. Ramus Intermedius: Large ramus with a long stented segment in the proximal to mid vessel.  40 to 50% in-stent restenosis in the distal stented segment.  Totally occluded SVG-ramus. Left Circumflex: The AV LCx is small in caliber, 80% ostial stenosis. Right Coronary Artery: Occluded proximal RCA.  The SVG-RCA is known to be occluded from prior caths and was not injected.  There were extensive left to right collaterals. CONCLUSION: 1. Occluded SVG-RCA and SVG-ramus, patent LIMA-LAD.  2. The large ramus was patent with 40-50% mid ramus stenosis  (in-stent restenosis).  3. 80% ostial stenosis very small caliber AV LCx. Not interventional target.  4. Occluded RCA (known from prior) with collaterals.  -No target for intervention. Aggressive medical management of angina.   Echo 04/14/19: IMPRESSIONS 1. Mild hypokinesis of the left ventricular, basal inferolateral wall. 2. The left ventricle has low normal systolic function, with an ejection fraction of 50-55%. The cavity size was normal. There is moderate concentric left ventricular hypertrophy. Left ventricular diastolic Doppler parameters are consistent with  pseudonormalization. 3. The right ventricle has normal systolic function. The cavity was normal. There is no increase in right ventricular wall thickness. Right ventricular systolic pressure could not be assessed. 4. Left atrial size was mildly dilated. 5. Right atrial size was mildly dilated. 6. No evidence of mitral valve stenosis. 7. The aortic valve is tricuspid. Mild thickening of the aortic valve. Mild calcification of the aortic valve. No stenosis of the aortic valve. 8. The aorta is normal in size and structure. 9. The aortic root is normal in size and structure. SUMMARY Compared to prior, EF is similar to slightly improved, wall motion is only mildy hypokinetic in basal inferolateral wall. )        Anesthesia Quick Evaluation

## 2020-12-19 NOTE — Anesthesia Postprocedure Evaluation (Signed)
Anesthesia Post Note  Patient: Kenneth [name]  Procedure(s) Performed: ESOPHAGOGASTRODUODENOSCOPY (EGD) WITH PROPOFOL (N/A ) BIOPSY     Patient location during evaluation: Endoscopy Anesthesia Type: MAC Level of consciousness: awake and alert Pain management: pain level controlled Vital Signs Assessment: post-procedure vital signs reviewed and stable Respiratory status: spontaneous breathing, nonlabored ventilation, respiratory function stable and patient connected to nasal cannula oxygen Cardiovascular status: stable and blood pressure returned to baseline Postop Assessment: no apparent nausea or vomiting Anesthetic complications: no   No complications documented.  Last Vitals:  Vitals:   12/19/20 1417 12/19/20 1603  BP: (!) 160/35 (!) 124/30  Pulse:  66  Resp:  16  Temp:  36.6 C  SpO2:  98%    Last Pain:  Vitals:   12/19/20 1603  TempSrc: Oral  PainSc:                  Catalina Gravel

## 2020-12-19 NOTE — Transfer of Care (Signed)
Immediate Anesthesia Transfer of Care Note  Patient: Kenneth [name]  Procedure(s) Performed: ESOPHAGOGASTRODUODENOSCOPY (EGD) WITH PROPOFOL (N/A ) BIOPSY  Patient Location: Endoscopy Unit  Anesthesia Type:MAC  Level of Consciousness: drowsy and patient cooperative  Airway & Oxygen Therapy: Patient Spontanous Breathing and Patient connected to nasal cannula oxygen  Post-op Assessment: Report given to RN and Post -op Vital signs reviewed and stable  Post vital signs: Reviewed and stable  Last Vitals:  Vitals Value Taken Time  BP 141/34 12/19/20 1357  Temp    Pulse 56 12/19/20 1358  Resp 19 12/19/20 1358  SpO2 100 % 12/19/20 1358  Vitals shown include unvalidated device data.  Last Pain:  Vitals:   12/19/20 1225  TempSrc: Oral  PainSc: 0-No pain      Patients Stated Pain Goal: 0 (93/90/30 0923)  Complications: No complications documented.

## 2020-12-19 NOTE — Care Management Important Message (Signed)
Important Message  Patient Details  Name: Kenneth [name] MRN: [id number] Date of Birth: 20-Apr-1956   Medicare Important Message Given:  Yes     [name] 12/19/2020, 10:27 AM

## 2020-12-19 NOTE — Progress Notes (Signed)
Inpatient Diabetes Program Recommendations  AACE/ADA: New Consensus Statement on Inpatient Glycemic Control (2015)  Target Ranges:  Prepandial:   less than 140 mg/dL      Peak postprandial:   less than 180 mg/dL (1-2 hours)      Critically ill patients:  140 - 180 mg/dL   Lab Results  Component Value Date   GLUCAP 151 (H) 12/19/2020   HGBA1C 5.9 (H) 12/16/2020    Review of Glycemic Control Results for Kenneth [name], Kenneth [name] (MRN [id number]) as of 12/19/2020 10:25  Ref. Range 12/18/2020 15:42 12/18/2020 19:33 12/18/2020 23:08 12/19/2020 03:53 12/19/2020 07:54  Glucose-Capillary Latest Ref Range: 70 - 99 mg/dL 140 (H) 206 (H) 170 (H) 167 (H) 151 (H)   Diabetes history:  DM1(does not make insulin.  Needs correction, basal and meal coverage) Outpatient Diabetes medications:  Insulin pump Current orders for Inpatient glycemic control:  Novolog 0-9 units Q4H  Noticed insulin pump order set and Novolog 0-9 units Q4H.  Spoke with Claiborne Billings, RN.  She explained his insulin pump battery died last night.  He was started on Novolog 0-9 units Q4H.  Wife is to bring in pump supplies today.  He has type 1 DM and will require basal insulin if pump supplies are not delivered to bedside today.    Will continue to follow while inpatient.  Thank you, Reche Dixon, RN, BSN Diabetes Coordinator Inpatient Diabetes Program [phone] (team pager from 8a-5p)

## 2020-12-19 NOTE — Progress Notes (Signed)
Patient ID: Kenneth [name], male   DOB: 02-06-1956, 65 y.o.   MRN: [id number]  PROGRESS NOTE    Kenneth [name]  [name] DOB: 10-21-1955 DOA: 12/15/2020 PCP: [name], MD   Brief Narrative:  66 y.o.malewith medical history significant of HLD, HTN, DM1, A.fib, ESRD on HD, CAD sp CABG presented with CP tightness and weakness along with intermittent bloody stools.  On EMS arrival, patient was bradycardic into the 40s and poorly responsive for which she was given 1 mg of atropine and heart rate went up to 40 to 60s.  In the ED, potassium was 6.2, EKG showed junctional rhythm; he was given Lokelma, glucose and insulin.  Nephrology is consulted and he subsequently had HD.  He also was found to have hemoglobin drop of 2 units with Hemoccult positive stool.  GI was consulted.  Assessment & Plan:   Symptomatic bradycardia -Status post atropine on presentation.  Heart rate in the 60s to 70s currently.  Beta-blocker on hold. -EKG showed no acute ST changes -Echo in 3/22 showed EF of 55 to 60% with grade 2 diastolic dysfunction -Cardiology following  Possible non-STEMI versus demand ischemia in a patient with history of CAD status post CABG Hyperlipidemia -Troponin peaked up to more than 700.  EKG with no acute ST changes.  Cardiology following.  Continue statin and Zetia.  Imdur, losartan and metoprolol on hold.  Currently on nitroglycerin drip as per cardiology: Plans for cardiac cath once cleared by GI  GI bleeding Anemia of chronic kidney disease -Baseline hemoglobin around 9-10.  Hemoglobin dropped to 6.7.  Status post 1 unit packed red cell transfusion on 12/17/2020.  Hemoglobin 8.9 today -Continue PPI IV twice a day.  GI following.  Eliquis on hold.  For possible EGD today.  End-stage renal disease on hemodialysis Hyperkalemia: Resolved -Nephrology following.  Dialysis as per nephrology schedule  Chronic diastolic heart failure -Volume management as per hemodialysis  Diabetes  mellitus type 1 with hypoglycemia -A1c 5.9.  Continue insulin pump.  Diabetes coordinator following.  Continue CBG checks  Paroxysmal A. Fib -Currently rate controlled.  Metoprolol and Eliquis on hold  Hypertension -Blood pressure intermittently elevated.  Might have to resume antihypertensives.  Generalized weakness--PT/OT eval   DVT prophylaxis: SCDs Code Status: Full Family Communication: None at bedside Disposition Plan: Status is: Inpatient  Remains inpatient appropriate because:Inpatient level of care appropriate due to severity of illness   Dispo: The patient is from: Home              Anticipated d/c is to: Home              Patient currently is not medically stable to d/c.   Difficult to place patient No  Consultants: Nephrology/GI/cardiology  Procedures: None  Antimicrobials: None   Subjective: Patient seen and examined at bedside.  Denies current chest pain.  No overnight worsening shortness of breath, fever, vomiting, abdominal pain or bloody stools reported.  Objective: Vitals:   12/18/20 1200 12/18/20 1630 12/18/20 2304 12/19/20 0351  BP: (!) 128/53 (!) 147/39 (!) 150/38 (!) 130/55  Pulse: 70 74 67 75  Resp: 17 (!) '22 16 19  '$ Temp: 98.4 F (36.9 C) 98.3 F (36.8 C) 98.5 F (36.9 C)   TempSrc: Oral Oral Oral   SpO2:  99% 98% 98%  Weight: 57.6 kg       Intake/Output Summary (Last 24 hours) at 12/19/2020 0731 Last data filed at 12/18/2020 1206 Gross per 24 hour  Intake 109.03 ml  Output 3000 ml  Net -2890.97 ml   Filed Weights   12/18/20 0810 12/18/20 1200  Weight: 60.9 kg 57.6 kg    Examination:  General exam: No acute distress.  On room air currently  respiratory system: Decreased breath sounds at bases bilaterally with scattered crackles Cardiovascular system: Rate controlled, S1-S2 heard Gastrointestinal system: Abdomen is mildly distended, soft and nontender.  Bowel sounds are heard  extremities: Mild lower extremity edema present  bilaterally; no clubbing Central nervous system: More awake, answers some questions appropriately.  No focal neurological deficits.  Moves extremities Skin: No obvious ecchymosis/rashes  psychiatry: Flat affect   Data Reviewed: I have personally reviewed following labs and imaging studies  CBC: Recent Labs  Lab 12/15/20 1636 12/15/20 1707 12/16/20 0857 12/16/20 1600 12/17/20 0439 12/17/20 1143 12/18/20 0253 12/19/20 0149  WBC 4.5  --  4.5 5.0 3.8*  --  4.1 4.3  NEUTROABS 2.8  --  2.9  --  2.3  --  2.2 2.5  HGB 7.1*   < > 7.4* 7.3* 6.7* 8.2* 8.3* 8.9*  HCT 21.9*   < > 22.1* 22.6* 20.2* 24.6* 24.4* 26.6*  MCV 100.9*  --  98.7 100.0 98.5  --  96.8 95.7  PLT 97*  --  103* 112* 104*  --  98* 129*   < > = values in this interval not displayed.   Basic Metabolic Panel: Recent Labs  Lab 12/15/20 1636 12/15/20 1707 12/15/20 1708 12/16/20 0406 12/16/20 0857 12/17/20 0439 12/18/20 0253 12/19/20 0149  NA 132* 131* 131*  --  136 135 136 133*  K 6.2* 6.2* 6.2*  --  5.0 3.8 4.1 4.7  CL 94* 96*  --   --  98 97* 98 97*  CO2 26  --   --   --  29 32 30 28  GLUCOSE 345* 329*  --   --  128* 218* 79 183*  BUN 67* 75*  --   --  32* 14 28* 25*  CREATININE 9.40* 9.70*  --   --  6.04* 3.67* 6.26* 5.08*  CALCIUM 7.8*  --   --   --  8.7* 7.9* 8.6* 8.2*  PHOS  --   --   --  4.2 4.3 2.9 5.6* 4.1   GFR: CrCl cannot be calculated (Unknown ideal weight.). Liver Function Tests: Recent Labs  Lab 12/15/20 1636 12/16/20 0857 12/17/20 0439 12/18/20 0253 12/19/20 0149  AST 18  --   --   --   --   ALT 17  --   --   --   --   ALKPHOS 82  --   --   --   --   BILITOT 0.6  --   --   --   --   PROT 5.7*  --   --   --   --   ALBUMIN 3.3* 3.2* 3.1* 3.2* 3.5   No results for input(s): LIPASE, AMYLASE in the last 168 hours. No results for input(s): AMMONIA in the last 168 hours. Coagulation Profile: Recent Labs  Lab 12/15/20 1636  INR 1.3*   Cardiac Enzymes: No results for input(s): CKTOTAL,  CKMB, CKMBINDEX, TROPONINI in the last 168 hours. BNP (last 3 results) No results for input(s): PROBNP in the last 8760 hours. HbA1C: Recent Labs    12/16/20 1600  HGBA1C 5.9*   CBG: Recent Labs  Lab 12/18/20 1248 12/18/20 1542 12/18/20 1933 12/18/20 2308 12/19/20 0353  GLUCAP 116* 140*  206* 170* 167*   Lipid Profile: No results for input(s): CHOL, HDL, LDLCALC, TRIG, CHOLHDL, LDLDIRECT in the last 72 hours. Thyroid Function Tests: No results for input(s): TSH, T4TOTAL, FREET4, T3FREE, THYROIDAB in the last 72 hours. Anemia Panel: Recent Labs    12/16/20 1600  VITAMINB12 304  FOLATE 6.7  FERRITIN 1,038*  TIBC 262  IRON 72   Sepsis Labs: No results for input(s): PROCALCITON, LATICACIDVEN in the last 168 hours.  Recent Results (from the past 240 hour(s))  Resp Panel by RT-PCR (Flu A&B, Covid) Nasopharyngeal Swab     Status: None   Collection Time: 12/15/20  8:57 PM   Specimen: Nasopharyngeal Swab; Nasopharyngeal(NP) swabs in vial transport medium  Result Value Ref Range Status   SARS Coronavirus 2 by RT PCR NEGATIVE NEGATIVE Final    Comment: (NOTE) SARS-CoV-2 target nucleic acids are NOT DETECTED.  The SARS-CoV-2 RNA is generally detectable in upper respiratory specimens during the acute phase of infection. The lowest concentration of SARS-CoV-2 viral copies this assay can detect is 138 copies/mL. A negative result does not preclude SARS-Cov-2 infection and should not be used as the sole basis for treatment or other patient management decisions. A negative result may occur with  improper specimen collection/handling, submission of specimen other than nasopharyngeal swab, presence of viral mutation(s) within the areas targeted by this assay, and inadequate number of viral copies(<138 copies/mL). A negative result must be combined with clinical observations, patient history, and epidemiological information. The expected result is Negative.  Fact Sheet for  Patients:  EntrepreneurPulse.com.au  Fact Sheet for Healthcare Providers:  IncredibleEmployment.be  This test is no t yet approved or cleared by the [place] FDA and  has been authorized for detection and/or diagnosis of SARS-CoV-2 by FDA under an Emergency Use Authorization (EUA). This EUA will remain  in effect (meaning this test can be used) for the duration of the COVID-19 declaration under Section 564(b)(1) of the Act, 21 U.S.C.section 360bbb-3(b)(1), unless the authorization is terminated  or revoked sooner.       Influenza A by PCR NEGATIVE NEGATIVE Final   Influenza B by PCR NEGATIVE NEGATIVE Final    Comment: (NOTE) The Xpert Xpress SARS-CoV-2/FLU/RSV plus assay is intended as an aid in the diagnosis of influenza from Nasopharyngeal swab specimens and should not be used as a sole basis for treatment. Nasal washings and aspirates are unacceptable for Xpert Xpress SARS-CoV-2/FLU/RSV testing.  Fact Sheet for Patients: EntrepreneurPulse.com.au  Fact Sheet for Healthcare Providers: IncredibleEmployment.be  This test is not yet approved or cleared by the [place] FDA and has been authorized for detection and/or diagnosis of SARS-CoV-2 by FDA under an Emergency Use Authorization (EUA). This EUA will remain in effect (meaning this test can be used) for the duration of the COVID-19 declaration under Section 564(b)(1) of the Act, 21 U.S.C. section 360bbb-3(b)(1), unless the authorization is terminated or revoked.  Performed at [facility], [place] [address], [place], [place] [address]          Radiology Studies: No results found.      Scheduled Meds: . atorvastatin  80 mg Oral Daily  . Chlorhexidine Gluconate Cloth  6 each Topical Q0600  . cinacalcet  60 mg Oral Q M,W,F  . [START ON 12/25/2020] darbepoetin (ARANESP) injection - DIALYSIS  60 mcg Intravenous Q Wed-HD  .  ezetimibe  10 mg Oral Daily  . insulin aspart  0-9 Units Subcutaneous Q4H  . insulin aspart  5 Units Intravenous  Once  . insulin pump   Subcutaneous Q4H  . pantoprazole (PROTONIX) IV  40 mg Intravenous Q12H   Continuous Infusions: . sodium chloride    . sodium chloride    . sodium chloride    . sodium chloride    . nitroGLYCERIN Stopped (12/19/20 [id number])          Aline August, MD [facility] 12/19/2020, 7:31 AM

## 2020-12-19 NOTE — Op Note (Addendum)
[facility] Patient Name: Kenneth [name] Procedure Date : 12/19/2020 MRN: [id number] Attending MD: Ladene Artist , MD Date of Birth: 12-19-1955 CSN: [id number] Age: 65 Admit Type: Inpatient Procedure:                Upper GI endoscopy Indications:              Anemia, unspecified, Heme positive stool Providers:                Pricilla Riffle. Fuller Plan, MD, Kary Kos RN, RN, Ladona Ridgel, Technician, Claybon Jabs, CRNA Referring MD:             [facility] Medicines:                Monitored Anesthesia Care Complications:            No immediate complications. Estimated Blood Loss:     Estimated blood loss was minimal. Procedure:                Pre-Anesthesia Assessment:                           - Prior to the procedure, a History and Physical                            was performed, and patient medications and                            allergies were reviewed. [name] tolerance of                            previous anesthesia was also reviewed. The risks                            and benefits of the procedure and the sedation                            options and risks were discussed with the patient.                            All questions were answered, and informed consent                            was obtained. Prior Anticoagulants: The patient has                            taken Eliquis (apixaban), last dose was 2 days                            prior to procedure. ASA Grade Assessment: III - A                            patient with severe systemic disease. After  reviewing the risks and benefits, the patient was                            deemed in satisfactory condition to undergo the                            procedure.                           After obtaining informed consent, the endoscope was                            passed under direct vision. Throughout the                            procedure, the  patient's blood pressure, pulse, and                            oxygen saturations were monitored continuously. The                            [id number]) Olympus gastroscope was                            introduced through the mouth, and advanced to the                            second part of duodenum. The upper GI endoscopy was                            accomplished without difficulty. The patient                            tolerated the procedure well. Scope In: Scope Out: Findings:      The examined esophagus was normal.      Patchy mildly erythematous mucosa without bleeding was found in the       entire examined stomach. Biopsies were taken with a cold forceps for       histology.      The exam of the stomach was otherwise normal.      Localized mildly erythematous mucosa without active bleeding and with no       stigmata of bleeding was found in the duodenal bulb.      A moderate post-ulcer deformity was found in the duodenal bulb.      The exam of the duodenum was otherwise normal. Impression:               - Normal esophagus.                           - Erythematous mucosa in the stomach. Biopsied.                           - Erythematous duodenopathy.                           -  Duodenal deformity. Recommendation:           - Return patient to hospital ward for ongoing care.                           - The signs and symptoms of potential delayed                            complications were discussed with the patient.                           - Clear liquid diet today.                           - Continue present medications.                           - Continue to hold Eliquis.                           - Await pathology results.                           - Perform a colonoscopy tomorrow. Procedure Code(s):        --- Professional ---                           [id number], Esophagogastroduodenoscopy, flexible,                            transoral; with biopsy, single  or multiple Diagnosis Code(s):        --- Professional ---                           K31.89, Other diseases of stomach and duodenum                           D50.0, Iron deficiency anemia secondary to blood                            loss (chronic)                           R19.5, Other fecal abnormalities CPT copyright 2019 American Medical Association. All rights reserved. The codes documented in this report are preliminary and upon coder review may  be revised to meet current compliance requirements. Ladene Artist, MD 12/19/2020 2:04:30 PM This report has been signed electronically. Number of Addenda: 0

## 2020-12-19 NOTE — Progress Notes (Signed)
[facility] Progress Note  Subjective: no c/o today  Vitals:   12/19/20 1225 12/19/20 1358 12/19/20 1407 12/19/20 1417  BP: (!) 172/45 (!) 141/34 (!) 153/43 (!) 160/35  Pulse:  (!) 56 (!) 56   Resp: $Remo'16 19 12   'SZKue$ Temp:  98.1 F (36.7 C)    TempSrc: Oral Temporal    SpO2: 99% 100% 99%   Weight:        Exam:   alert, nad   no jvd  Chest cta bilat  Cor reg no RG  Abd soft ntnd no ascites   Ext no LE edema   Alert, NF, ox3    AVF+bruit    OP HD: MWF AF  3h 82min  400/500   59.5kg  2/2 bath  AVF     Assessment/ Plan: 1. Symptomatic bradycardia: metoprolol on hold. HR 60's.  2. CAD/ hx atrial fib - eliquis on hold for anemia. NSR now.  3. Anemia - +FOB, Hb 8.3. Per GI will get EGD today.  4. ESRD: usual HD MWF. Plan HD tomorrow.   5. Volume/ hypertension: BP's up a bit, under dry 2 kg. Lower edw on dc. No vol ^ per exam.  6. Anemia ckd: sp prbc'sx 1 here for Hb 6.8. Hb 8.9 today. Currently receiving mircera 53mcg q 4wks, due for esa. Plan IV aranesp 60mcg weekly on Tuesday (given 4/5). Hold weekly iron given transfusion prn.  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 Phos 3- 5, resume phoslo 1 ac tid.   8. Vascular access: AVF issues being evaluated outpt per patient   Kelly Splinter 12/19/2020, 2:40 PM   Recent Labs  Lab 12/18/20 0253 12/19/20 0149  K 4.1 4.7  BUN 28* 25*  CREATININE 6.26* 5.08*  CALCIUM 8.6* 8.2*  PHOS 5.6* 4.1  HGB 8.3* 8.9*   Inpatient medications: . atorvastatin  80 mg Oral Daily  . Chlorhexidine Gluconate Cloth  6 each Topical Q0600  . cinacalcet  60 mg Oral Q M,W,F  . [START ON 12/25/2020] darbepoetin (ARANESP) injection - DIALYSIS  60 mcg Intravenous Q Wed-HD  . ezetimibe  10 mg Oral Daily  . insulin aspart  0-9 Units Subcutaneous Q4H  . insulin aspart  5 Units Intravenous Once  . insulin pump   Subcutaneous Q4H  . pantoprazole (PROTONIX) IV  40 mg Intravenous Q12H  . peg 3350 powder  0.5 kit Oral Once   And  . peg 3350  powder  0.5 kit Oral Once   . sodium chloride    . sodium chloride    . sodium chloride    . nitroGLYCERIN Stopped (12/19/20 0727)   sodium chloride, sodium chloride, acetaminophen, alteplase, heparin, lidocaine (PF), lidocaine-prilocaine, pentafluoroprop-tetrafluoroeth

## 2020-12-19 NOTE — Progress Notes (Signed)
Progress Note  Patient Name: Kenneth [name] Date of Encounter: 12/19/2020  [name] Cardiologist: No primary care provider on file.   Subjective   Doing well this morning. Denies any chest pain. Plan for EGD today with GI  Inpatient Medications    Scheduled Meds: . atorvastatin  80 mg Oral Daily  . Chlorhexidine Gluconate Cloth  6 each Topical Q0600  . cinacalcet  60 mg Oral Q M,W,F  . [START ON 12/25/2020] darbepoetin (ARANESP) injection - DIALYSIS  60 mcg Intravenous Q Wed-HD  . ezetimibe  10 mg Oral Daily  . insulin aspart  0-9 Units Subcutaneous Q4H  . insulin aspart  5 Units Intravenous Once  . insulin pump   Subcutaneous Q4H  . pantoprazole (PROTONIX) IV  40 mg Intravenous Q12H   Continuous Infusions: . sodium chloride    . sodium chloride    . sodium chloride    . sodium chloride    . nitroGLYCERIN Stopped (12/19/20 0727)   PRN Meds: sodium chloride, sodium chloride, acetaminophen, alteplase, heparin, lidocaine (PF), lidocaine-prilocaine, pentafluoroprop-tetrafluoroeth   Vital Signs    Vitals:   12/18/20 1630 12/18/20 2304 12/19/20 0351 12/19/20 0755  BP: (!) 147/39 (!) 150/38 (!) 130/55 (!) 168/40  Pulse: 74 67 75 66  Resp: (!) '22 16 19 17  '$ Temp: 98.3 F (36.8 C) 98.5 F (36.9 C)  98.4 F (36.9 C)  TempSrc: Oral Oral  Oral  SpO2: 99% 98% 98% 95%  Weight:        Intake/Output Summary (Last 24 hours) at 12/19/2020 0800 Last data filed at 12/18/2020 1206 Gross per 24 hour  Intake 109.03 ml  Output 3000 ml  Net -2890.97 ml   Last 3 Weights 12/18/2020 12/18/2020 11/28/2020  Weight (lbs) 126 lb 15.8 oz 134 lb 4.2 oz 133 lb  Weight (kg) 57.6 kg 60.9 kg 60.328 kg      Telemetry    NSR - Personally Reviewed  ECG    No new tracing - Personally Reviewed  Physical Exam   GEN: No acute distress.  Laying comfortably in bed Neck: No JVD Cardiac: RRR, soft systolic murmur, rubs, or gallops. AV fistula in pace Respiratory: Clear to auscultation  bilaterally. GI: Soft, nontender, non-distended  MS: No edema; No deformity. Neuro:  Nonfocal  Psych: Normal affect   Labs    High Sensitivity Troponin:   Recent Labs  Lab 12/16/20 0627 12/16/20 1706 12/16/20 2334 12/17/20 0439 12/17/20 0623  TROPONINIHS 48* 137* 562* 745* 735*      Chemistry Recent Labs  Lab 12/15/20 1636 12/15/20 1707 12/17/20 0439 12/18/20 0253 12/19/20 0149  NA 132*   < > 135 136 133*  K 6.2*   < > 3.8 4.1 4.7  CL 94*   < > 97* 98 97*  CO2 26   < > 32 30 28  GLUCOSE 345*   < > 218* 79 183*  BUN 67*   < > 14 28* 25*  CREATININE 9.40*   < > 3.67* 6.26* 5.08*  CALCIUM 7.8*   < > 7.9* 8.6* 8.2*  PROT 5.7*  --   --   --   --   ALBUMIN 3.3*   < > 3.1* 3.2* 3.5  AST 18  --   --   --   --   ALT 17  --   --   --   --   ALKPHOS 82  --   --   --   --  BILITOT 0.6  --   --   --   --   GFRNONAA 6*   < > 18* 9* 12*  ANIONGAP 12   < > '6 8 8   '$ < > = values in this interval not displayed.     Hematology Recent Labs  Lab 12/17/20 0439 12/17/20 1143 12/18/20 0253 12/19/20 0149  WBC 3.8*  --  4.1 4.3  RBC 2.05*  --  2.52* 2.78*  HGB 6.7* 8.2* 8.3* 8.9*  HCT 20.2* 24.6* 24.4* 26.6*  MCV 98.5  --  96.8 95.7  MCH 32.7  --  32.9 32.0  MCHC 33.2  --  34.0 33.5  RDW 15.2  --  14.8 14.6  PLT 104*  --  98* 129*    BNP Recent Labs  Lab 12/16/20 0406  BNP 1,991.6*     DDimer No results for input(s): DDIMER in the last 168 hours.   Radiology    No results found.  Cardiac Studies   TTE 11/28/20: 1. Left ventricular ejection fraction, by estimation, is 55 to 60%. The  left ventricle has normal function. The left ventricle has no regional  wall motion abnormalities. There is moderate left ventricular hypertrophy.  Left ventricular diastolic  parameters are consistent with Grade II diastolic dysfunction  (pseudonormalization). Elevated left atrial pressure.  2. Right ventricular systolic function is normal. The right ventricular  size is  normal.  3. The mitral valve is normal in structure. Trivial mitral valve  regurgitation. No evidence of mitral stenosis.  4. The aortic valve is tricuspid. Aortic valve regurgitation is trivial.  Mild aortic valve sclerosis is present, with no evidence of aortic valve  stenosis.  5. The inferior vena cava is normal in size with greater than 50%  respiratory variability, suggesting right atrial pressure of 3 mmHg.   Cath 04/17/2019: 1. Occluded SVG-RCA and SVG-ramus, patent LIMA-LAD.  2. The large ramus was patent with 40-50% mid ramus stenosis (in-stent restenosis).  3. 80% ostial stenosis very small caliber AV LCx. Not interventional target.  4. Occluded RCA (known from prior) with collaterals.   No target for intervention. Aggressive medical management of angina.  Myoview 11/15/18:  Baseline EKG showed NSR with LVH and repolarization abnormality in the inferolateral leads. No change in ST segments during Lexiscan infusion.  Nuclear stress EF: 54%.  The left ventricular ejection fraction is mildly decreased (45-54%).  There is a small defect of mild severity present in the basal inferior and mid inferior location. The defect is non-reversible and in the setting of normal LVF is consistent with diaphragmatic attenuation artifact. No ischemia noted.  This is a low risk study.   Patient Profile     65 y.o. male with a hx of HTN, HLD, DMII, Afib, ESRD on HD, CAD s/p CABGwith multiple subsequent PCIs who presented with chest tightness found to be bradycardic to 40s in the setting of BB use responsive to atropine with course complicated by trop rise 48-->745. Cardiology has now been consulted for management of bradycardia and NSTEMI.  Assessment & Plan    #NSTEMI #CAD s/p CABG with multiple subsequent PCIs and caths: Patient with extensive history of CAD s/p CABG in 2017 with several subsequent NSTEMIs/PCIs with last in 2020. Presented with bradycardia and chest tightness with  initial troponins in 40s. Trop rose overnight from 100-->500s-->745 this AM. Currently with continued substernal chest tightness. Notably, this rise correlates with drop in hemoglobin with concern for GIB suggesting element of demand in the  setting of known significant CAD. Will need to stabilize from GIB standpoint first prior to proceeding with further work-up of NSTEMI. -Manage GIB and anemia per GI and primary team -Holding antiplatelet agents and AC due to GIB; resume once able -Continue nitro gtt -Continue lipitor '80mg'$  daily and zetia '10mg'$  daily -Holding ARB due to bleed and metop due to bradycardia -Once stable from GIB standpoint, likely will need repeat coronary angiography given significant rise in troponin and known CAD   #Symptomatic Bradycardia: Resolved s/p holding metop.  -Stopped metop -Monitor on telemetry  #Chronic Diastolic Heart Failure: LVEF 55-60%, moderate LVH, G2DD.Does not appear acutely decompensated. -Continue volume manangement with HD -Hold metop due to symptomatic bradycardia -Holding losartan due to GIB -Follows with HF clinic  #Acute on chronic anemia #GIB: Hemoglobin stable at 8.9 today. -Management per GI--plan for EGD today -Holding eliquis -Will need to be stabilized from GI standpoint prior to proceeding with ischemic work-up  #Atrial Fibrillation: CHADs-vasc 4 -Holding eliquis givenGIB as above -Hold BBdue to symptomatic bradycardia as above  #HTN: -Currently being held due to concern for GIB and recent bradycardia episode; resume once more clinically stable  #HLD: LDL 52. -Resumeatorvastatin '80mg'$  daily and zetia '10mg'$  daily  #Carotid Stenosis: Mild on recent carotid ultrasound. -Continue statin and zetia as above  #ESRD: -Continue scheduled HD  #DMII: -On insulin      For questions or updates, please contact [place] Please consult [email or web address] for contact info under        Signed, Freada Bergeron,  MD  12/19/2020, 8:00 AM

## 2020-12-19 NOTE — Anesthesia Preprocedure Evaluation (Signed)
Anesthesia Evaluation  Patient identified by MRN, date of birth, ID band Patient awake    Reviewed: Allergy & Precautions, NPO status , Patient's Chart, lab work & pertinent test results  Airway Mallampati: I  TM Distance: >3 FB Neck ROM: Full    Dental   Pulmonary former smoker,    Pulmonary exam normal        Cardiovascular hypertension, Pt. on medications + CAD, + Past MI and + Cardiac Stents  Normal cardiovascular exam     Neuro/Psych PSYCHIATRIC DISORDERS Depression Carotid US 09/15/18: Summary: -Right Carotid: Non-hemodynamically significant plaque <50% noted in the CCA. The ECA appears >50% stenosed. The extracranial vessels were near-normal with only minimal wall thickening or plaque. -Left Carotid: Velocities in the left ICA are consistent with a 1-39% stenosis. Non-hemodynamically significant plaque noted in the CCA. -Vertebrals: Left vertebral artery demonstrates antegrade flow. Right vertebral artery demonstrates probable occlusion. -Subclavians: Normal flow hemodynamics were seen in the right subclavian artery. The left subclavian artery has normal low resistance and turbulent flow due to AVF.    GI/Hepatic GERD  Medicated and Controlled,  Endo/Other  diabetes  Renal/GU ESRF and DialysisRenal disease     Musculoskeletal   Abdominal   Peds  Hematology   Anesthesia Other Findings   Reproductive/Obstetrics                             Anesthesia Physical  Anesthesia Plan  ASA: III  Anesthesia Plan: MAC   Post-op Pain Management:    Induction: Intravenous  PONV Risk Score and Plan: 1 and Propofol infusion  Airway Management Planned: Nasal Cannula and Natural Airway  Additional Equipment:   Intra-op Plan:   Post-operative Plan:   Informed Consent: I have reviewed the patients  History and Physical, chart, labs and discussed the procedure including the risks, benefits and alternatives for the proposed anesthesia with the patient or authorized representative who has indicated his/her understanding and acceptance.       Plan Discussed with: CRNA, Surgeon and Anesthesiologist  Anesthesia Plan Comments: (PAT note written 09/06/2019 by Myra Gianotti, PA-C. EKG:08/27/19: Sinus rhythm Probable left atrial enlargement LVH with secondary repolarization abnormality Abnormal inferior Q waves Anterior ST elevation, probably due to LVH Borderline prolonged QT interval Confirmed by Sherwood Gambler [phone]) on 08/27/2019 1:23:41 PM  CV:  Cardiac cath 04/17/19: Left Anterior Descending: 90% proximal LAD stenosis, subtotal occlusion of the mid LAD.  LIMA-LAD is patent. Ramus Intermedius: Large ramus with a long stented segment in the proximal to mid vessel.  40 to 50% in-stent restenosis in the distal stented segment.  Totally occluded SVG-ramus. Left Circumflex: The AV LCx is small in caliber, 80% ostial stenosis. Right Coronary Artery: Occluded proximal RCA.  The SVG-RCA is known to be occluded from prior caths and was not injected.  There were extensive left to right collaterals. CONCLUSION: 1. Occluded SVG-RCA and SVG-ramus, patent LIMA-LAD.  2. The large ramus was patent with 40-50% mid ramus stenosis (in-stent restenosis).  3. 80% ostial stenosis very small caliber AV LCx. Not interventional target.  4. Occluded RCA (known from prior) with collaterals.  -No target for intervention. Aggressive medical management of angina.   Echo 04/14/19: IMPRESSIONS 1. Mild hypokinesis of the left ventricular, basal inferolateral wall. 2. The left ventricle has low normal systolic function, with an ejection fraction of 50-55%. The cavity size was normal. There is moderate concentric left ventricular hypertrophy. Left ventricular diastolic Doppler parameters are  consistent  with  pseudonormalization. 3. The right ventricle has normal systolic function. The cavity was normal. There is no increase in right ventricular wall thickness. Right ventricular systolic pressure could not be assessed. 4. Left atrial size was mildly dilated. 5. Right atrial size was mildly dilated. 6. No evidence of mitral valve stenosis. 7. The aortic valve is tricuspid. Mild thickening of the aortic valve. Mild calcification of the aortic valve. No stenosis of the aortic valve. 8. The aorta is normal in size and structure. 9. The aortic root is normal in size and structure. SUMMARY Compared to prior, EF is similar to slightly improved, wall motion is only mildy hypokinetic in basal inferolateral wall. )        Anesthesia Quick Evaluation

## 2020-12-19 NOTE — Plan of Care (Signed)

## 2020-12-19 NOTE — Interval H&P Note (Signed)
History and Physical Interval Note:  12/19/2020 1:28 PM  Kenneth [name]  has presented today for surgery, with the diagnosis of Anemia. Postive FOBT.  The various methods of treatment have been discussed with the patient and family. After consideration of risks, benefits and other options for treatment, the patient has consented to  Procedure(s): ESOPHAGOGASTRODUODENOSCOPY (EGD) WITH PROPOFOL (N/A) as a surgical intervention.  [name] history has been reviewed, patient examined, no change in status, stable for surgery.  I have reviewed the patient's chart and labs.  Questions were answered to the patient's satisfaction.     Pricilla Riffle. Fuller Plan

## 2020-12-19 NOTE — Progress Notes (Signed)
Spoke with Roby Lofts, PA patient off nitroglycerin drip, and concerned that b/p meds are being held. Was advised that Dr. Johney Frame would make determination on when to restart b/p meds during assessment. Alerted patient.

## 2020-12-20 ENCOUNTER — Inpatient Hospital Stay (HOSPITAL_COMMUNITY): Payer: Medicare Other | Admitting: Anesthesiology

## 2020-12-20 ENCOUNTER — Encounter (HOSPITAL_COMMUNITY)
Admission: EM | Disposition: A | Discharge status: Home / Self Care | Payer: Self-pay | Source: Home / Self Care | Attending: Internal Medicine

## 2020-12-20 ENCOUNTER — Encounter (HOSPITAL_COMMUNITY): Payer: Self-pay | Admitting: Internal Medicine

## 2020-12-20 DIAGNOSIS — D62 Acute posthemorrhagic anemia: Secondary | ICD-10-CM | POA: Diagnosis not present

## 2020-12-20 DIAGNOSIS — I5032 Chronic diastolic (congestive) heart failure: Secondary | ICD-10-CM | POA: Diagnosis not present

## 2020-12-20 DIAGNOSIS — K921 Melena: Secondary | ICD-10-CM | POA: Diagnosis not present

## 2020-12-20 DIAGNOSIS — I208 Other forms of angina pectoris: Secondary | ICD-10-CM

## 2020-12-20 DIAGNOSIS — R079 Chest pain, unspecified: Secondary | ICD-10-CM | POA: Diagnosis not present

## 2020-12-20 DIAGNOSIS — I25118 Atherosclerotic heart disease of native coronary artery with other forms of angina pectoris: Secondary | ICD-10-CM | POA: Diagnosis not present

## 2020-12-20 DIAGNOSIS — R001 Bradycardia, unspecified: Secondary | ICD-10-CM | POA: Diagnosis not present

## 2020-12-20 DIAGNOSIS — I480 Paroxysmal atrial fibrillation: Secondary | ICD-10-CM | POA: Diagnosis not present

## 2020-12-20 HISTORY — PX: COLONOSCOPY WITH PROPOFOL: SHX5780

## 2020-12-20 HISTORY — PX: POLYPECTOMY: SHX5525

## 2020-12-20 HISTORY — PX: GIVENS CAPSULE STUDY: SHX5432

## 2020-12-20 LAB — RENAL FUNCTION PANEL
Albumin: 3.4 g/dL — ABNORMAL LOW (ref 3.5–5.0)
Anion gap: 12 (ref 5–15)
BUN: 35 mg/dL — ABNORMAL HIGH (ref 8–23)
CO2: 24 mmol/L (ref 22–32)
Calcium: 8.5 mg/dL — ABNORMAL LOW (ref 8.9–10.3)
Chloride: 103 mmol/L (ref 98–111)
Creatinine, Ser: 7.95 mg/dL — ABNORMAL HIGH (ref 0.61–1.24)
GFR, Estimated: 7 mL/min — ABNORMAL LOW (ref >60)
Glucose, Bld: 87 mg/dL (ref 70–99)
Phosphorus: 4.2 mg/dL (ref 2.5–4.6)
Potassium: 4.3 mmol/L (ref 3.5–5.1)
Sodium: 139 mmol/L (ref 135–145)

## 2020-12-20 LAB — CBC WITH DIFFERENTIAL/PLATELET
Abs Immature Granulocytes: 0.02 10*3/uL (ref 0.00–0.07)
Basophils Absolute: 0 10*3/uL (ref 0.0–0.1)
Basophils Relative: 1 %
Eosinophils Absolute: 0.2 10*3/uL (ref 0.0–0.5)
Eosinophils Relative: 5 %
HCT: 25.6 % — ABNORMAL LOW (ref 39.0–52.0)
Hemoglobin: 8.6 g/dL — ABNORMAL LOW (ref 13.0–17.0)
Immature Granulocytes: 1 %
Lymphocytes Relative: 19 %
Lymphs Abs: 0.7 10*3/uL (ref 0.7–4.0)
MCH: 32.7 pg (ref 26.0–34.0)
MCHC: 33.6 g/dL (ref 30.0–36.0)
MCV: 97.3 fL (ref 80.0–100.0)
Monocytes Absolute: 0.5 10*3/uL (ref 0.1–1.0)
Monocytes Relative: 13 %
Neutro Abs: 2.3 10*3/uL (ref 1.7–7.7)
Neutrophils Relative %: 61 %
Platelets: 122 10*3/uL — ABNORMAL LOW (ref 150–400)
RBC: 2.63 MIL/uL — ABNORMAL LOW (ref 4.22–5.81)
RDW: 14.3 % (ref 11.5–15.5)
WBC: 3.7 10*3/uL — ABNORMAL LOW (ref 4.0–10.5)
nRBC: 0 % (ref 0.0–0.2)

## 2020-12-20 LAB — GLUCOSE, CAPILLARY
Glucose-Capillary: 103 mg/dL — ABNORMAL HIGH (ref 70–99)
Glucose-Capillary: 135 mg/dL — ABNORMAL HIGH (ref 70–99)
Glucose-Capillary: 141 mg/dL — ABNORMAL HIGH (ref 70–99)
Glucose-Capillary: 117 mg/dL — ABNORMAL HIGH (ref 70–99)
Glucose-Capillary: 350 mg/dL — ABNORMAL HIGH (ref 70–99)

## 2020-12-20 LAB — SURGICAL PATHOLOGY

## 2020-12-20 SURGERY — COLONOSCOPY WITH PROPOFOL
Anesthesia: Monitor Anesthesia Care

## 2020-12-20 MED ORDER — PROPOFOL 10 MG/ML IV BOLUS
INTRAVENOUS | Status: DC | PRN
  Administered 2020-12-20: 20 mg via INTRAVENOUS

## 2020-12-20 MED ORDER — HYDRALAZINE HCL 25 MG PO TABS
25.0000 mg | ORAL_TABLET | Freq: Three times a day (TID) | ORAL | Status: DC
  Administered 2020-12-20 – 2020-12-22 (×7): 25 mg via ORAL
  Filled 2020-12-20 (×7): qty 1

## 2020-12-20 MED ORDER — PROPOFOL 500 MG/50ML IV EMUL
INTRAVENOUS | Status: DC | PRN
  Administered 2020-12-20: 100 ug/kg/min via INTRAVENOUS

## 2020-12-20 MED ORDER — EPHEDRINE SULFATE-NACL 50-0.9 MG/10ML-% IV SOSY
PREFILLED_SYRINGE | INTRAVENOUS | Status: DC | PRN
  Administered 2020-12-20: 5 mg via INTRAVENOUS

## 2020-12-20 MED ORDER — AMLODIPINE BESYLATE 10 MG PO TABS
10.0000 mg | ORAL_TABLET | Freq: Every day | ORAL | Status: DC
  Administered 2020-12-20 – 2020-12-22 (×3): 10 mg via ORAL
  Filled 2020-12-20 (×3): qty 1

## 2020-12-20 MED ORDER — SODIUM CHLORIDE 0.9 % IV SOLN: INTRAVENOUS | Status: DC | PRN

## 2020-12-20 MED ORDER — VANCOMYCIN HCL IN DEXTROSE 750-5 MG/150ML-% IV SOLN
INTRAVENOUS | Status: AC
  Filled 2020-12-20: qty 150

## 2020-12-20 SURGICAL SUPPLY — 21 items

## 2020-12-20 NOTE — Interval H&P Note (Signed)
History and Physical Interval Note:  12/20/2020 8:53 AM  Kenneth [name]  has presented today for surgery, with the diagnosis of Anemia. + FOBT..  The various methods of treatment have been discussed with the patient and family. After consideration of risks, benefits and other options for treatment, the patient has consented to  Procedure(s): COLONOSCOPY WITH PROPOFOL (N/A) as a surgical intervention.  [name] history has been reviewed, patient examined, no change in status, stable for surgery.  I have reviewed the patient's chart and labs.  Questions were answered to the patient's satisfaction.     Destyne Goodreau

## 2020-12-20 NOTE — Op Note (Signed)
[facility] Patient Name: Kenneth [name] Procedure Date : 12/20/2020 MRN: [id number] Attending MD: Mauri Pole , MD Date of Birth: Feb 03, 1956 CSN: [id number] Age: 65 Admit Type: Inpatient Procedure:                Colonoscopy Indications:              Evaluation of unexplained GI bleeding presenting                            with fecal occult blood, Unexplained iron                            deficiency anemia Providers:                Mauri Pole, MD, Kary Kos RN, RN,                            Dulcy Fanny, Lesia Sago, Technician Referring MD:              Medicines:                Monitored Anesthesia Care Complications:            No immediate complications. Estimated blood loss:                            Minimal. Estimated Blood Loss:     Estimated blood loss was minimal. Procedure:                Pre-Anesthesia Assessment:                           - Prior to the procedure, a History and Physical                            was performed, and patient medications and                            allergies were reviewed. [name] tolerance of                            previous anesthesia was also reviewed. The risks                            and benefits of the procedure and the sedation                            options and risks were discussed with the patient.                            All questions were answered, and informed consent                            was obtained. Prior Anticoagulants: The patient                            last took Eliquis (  apixaban) 3 days prior to the                            procedure. ASA Grade Assessment: III - A patient                            with severe systemic disease. After reviewing the                            risks and benefits, the patient was deemed in                            satisfactory condition to undergo the procedure.                           After obtaining informed  consent, the colonoscope                            was passed under direct vision. Throughout the                            procedure, the patient's blood pressure, pulse, and                            oxygen saturations were monitored continuously. The                            [id number] Olympus pediatric colonoscope                            was introduced through the anus and advanced to the                            the terminal ileum, with identification of the                            appendiceal orifice and IC valve. The colonoscopy                            was performed without difficulty. The patient                            tolerated the procedure well. The quality of the                            bowel preparation was adequate. The terminal ileum,                            ileocecal valve, appendiceal orifice, and rectum                            were photographed. Scope In: 9:14:11 AM Scope Out: 9:44:04 AM Scope Withdrawal Time: 0 hours 13 minutes 32 seconds  Total Procedure Duration: 0 hours  29 minutes 53 seconds  Findings:      The perianal and digital rectal examinations were normal.      Hematin (altered blood/coffee-ground-like material) was found in the       entire colon. No obvious source of lower GI bleed after extensive lavage       and suction      A less than 1 mm polyp was found in the sigmoid colon. The polyp was       sessile. The polyp was removed with a cold biopsy forceps. Resection and       retrieval were complete.      Non-bleeding external and internal hemorrhoids were found during       retroflexion. The hemorrhoids were large. Impression:               - Blood in the entire examined colon. No obvious                            source of lower GI bleed after extensive lavage and                            suction                           - One less than 1 mm polyp in the sigmoid colon,                            removed with  a cold biopsy forceps. Resected and                            retrieved.                           - Non-bleeding external and internal hemorrhoids. Recommendation:           - Patient has a contact number available for                            emergencies. The signs and symptoms of potential                            delayed complications were discussed with the                            patient. Return to normal activities tomorrow.                            Written discharge instructions were provided to the                            patient.                           - Plan to proceed with small bowel video capsule                            study to exclude small bowel GI bleed/AVM                           -  NPO for 2 hours. Followed by Clear liquid for 6                            hours, then resume previous diet                           - Continue present medications.                           - Await pathology results.                           - Repeat colonoscopy in 5-10 years, date to be                            determined after pending pathology results are                            reviewed for surveillance based on pathology                            results. Procedure Code(s):        --- Professional ---                           [phone], Colonoscopy, flexible; with biopsy, single                            or multiple Diagnosis Code(s):        --- Professional ---                           K92.2, Gastrointestinal hemorrhage, unspecified                           K63.5, Polyp of colon                           K64.8, Other hemorrhoids                           R19.5, Other fecal abnormalities                           D50.9, Iron deficiency anemia, unspecified CPT copyright 2019 [employer] Medical Association. All rights reserved. The codes documented in this report are preliminary and upon coder review may  be revised to meet current compliance  requirements. Mauri Pole, MD 12/20/2020 9:56:36 AM This report has been signed electronically. Number of Addenda: 0

## 2020-12-20 NOTE — Progress Notes (Signed)
[name]  Subjective: no c/o today  Vitals:   12/20/20 1428 12/20/20 1430 12/20/20 1500 12/20/20 1530  BP: (!) 154/56 (!) 150/65 (!) 148/58 (!) 145/73  Pulse: 73 73 74 76  Resp: 15 (!) '22 18 19  '$ Temp:      TempSrc:      SpO2: 99% 100% 100% 99%  Weight:        Exam:   alert, nad   no jvd  Chest cta bilat  Cor reg no RG  Abd soft ntnd no ascites   Ext no LE edema   Alert, NF, ox3    AVF+bruit    OP HD: MWF AF  3h 56mn  400/500   59.5kg  2/2 bath  AVF     Assessment/ Plan: 1. Symptomatic bradycardia: metoprolol on hold. HR 60's.  2. CAD/ hx atrial fib - eliquis on hold for anemia. NSR now.  3. Anemia - +FOB, Hb 8.3. EGD negative 4/7, colonscopy today.  4. ESRD: usual HD MWF. HD today in progress.  5. Volume/ hypertension: BP's up a bit, 2kg up today. UF to dry wt.  6. Anemia ckd: sp prbc'sx 1 here for Hb 6.8. Hb 8- 9 now.  Currently receiving mircera 569m q 4wks, due for esa. Plan IV aranesp 6040mweekly on Tuesday (given 4/5). Hold weekly iron given transfusion prn.  7. MBD ckd: Sensipar 56m33m/ dialysis. Phos 3- 5, resume phoslo 1 ac tid.   8. Vascular access: AVF issues being evaluated outpt per patient   Rob Kelly Splinter/2022, 4:00 PM   Recent Labs  Lab 12/19/20 0149 12/20/20 0309  K 4.7 4.3  BUN 25* 35*  CREATININE 5.08* 7.95*  CALCIUM 8.2* 8.5*  PHOS 4.1 4.2  HGB 8.9* 8.6*   Inpatient medications: . amLODipine  10 mg Oral Daily  . atorvastatin  80 mg Oral Daily  . calcium acetate  667 mg Oral TID WC  . Chlorhexidine Gluconate Cloth  6 each Topical Q0600  . cinacalcet  60 mg Oral Q M,W,F  . [START ON 12/25/2020] darbepoetin (ARANESP) injection - DIALYSIS  60 mcg Intravenous Q Wed-HD  . ezetimibe  10 mg Oral Daily  . hydrALAZINE  25 mg Oral Q8H  . insulin aspart  5 Units Intravenous Once  . insulin pump   Subcutaneous Q4H  . pantoprazole (PROTONIX) IV  40 mg Intravenous Q12H   . sodium chloride    . sodium  chloride    . sodium chloride    . nitroGLYCERIN Stopped (12/19/20 0727)   sodium chloride, sodium chloride, acetaminophen, alteplase, heparin, lidocaine (PF), lidocaine-prilocaine, pentafluoroprop-tetrafluoroeth

## 2020-12-20 NOTE — Transfer of Care (Signed)
Immediate Anesthesia Transfer of Care Note  Patient: Kenneth [name]  Procedure(s) Performed: COLONOSCOPY WITH PROPOFOL (N/A ) POLYPECTOMY  Patient Location: PACU  Anesthesia Type:MAC  Level of Consciousness: awake, alert  and patient cooperative  Airway & Oxygen Therapy: Patient Spontanous Breathing  Post-op Assessment: Report given to RN and Post -op Vital signs reviewed and stable  Post vital signs: Reviewed and stable  Last Vitals:  Vitals Value Taken Time  BP 145/41 12/20/20 0956  Temp    Pulse 60 12/20/20 0959  Resp 10 12/20/20 0959  SpO2 100 % 12/20/20 0959  Vitals shown include unvalidated device data.  Last Pain:  Vitals:   12/20/20 0956  TempSrc:   PainSc: 0-No pain      Patients Stated Pain Goal: 0 (37/04/88 8916)  Complications: No complications documented.

## 2020-12-20 NOTE — Plan of Care (Signed)
Patient went for colonoscopy this morning. Result show no active bleeding. Patient was given Givens Capsule and instruction being followed at this time. Patient went for HD this afternoon. BP remains to be fluctuating, medications given per order. No CO pain or discomfort. Bed kept in low position and locked. Call bell in reach. No further complains at this time.   Problem: Education: Goal: Knowledge of General Education information will improve Description: Including pain rating scale, medication(s)/side effects and non-pharmacologic comfort measures Outcome: Progressing   Problem: Health Behavior/Discharge Planning: Goal: Ability to manage health-related needs will improve Outcome: Progressing   Problem: Clinical Measurements: Goal: Ability to maintain clinical measurements within normal limits will improve Outcome: Progressing Goal: Will remain free from infection Outcome: Progressing Goal: Diagnostic test results will improve Outcome: Progressing Goal: Respiratory complications will improve Outcome: Progressing Goal: Cardiovascular complication will be avoided Outcome: Progressing   Problem: Activity: Goal: Risk for activity intolerance will decrease Outcome: Progressing   Problem: Nutrition: Goal: Adequate nutrition will be maintained Outcome: Progressing   Problem: Coping: Goal: Level of anxiety will decrease Outcome: Progressing   Problem: Elimination: Goal: Will not experience complications related to bowel motility Outcome: Progressing Goal: Will not experience complications related to urinary retention Outcome: Progressing   Problem: Pain Managment: Goal: General experience of comfort will improve Outcome: Progressing   Problem: Safety: Goal: Ability to remain free from injury will improve Outcome: Progressing   Problem: Skin Integrity: Goal: Risk for impaired skin integrity will decrease Outcome: Progressing

## 2020-12-20 NOTE — Progress Notes (Addendum)
Progress Note  Patient Name: Kenneth [name] Date of Encounter: 12/21/2020  [facility] Cardiologist: No primary care provider on file.   Subjective   Tired this morning. States his telemetry kept alarming and waking him up. Remains chest pain free off nitro. No SOB.  Colonoscopy with blood throughout the colon but no obvious source of bleeding. 1 polyp removed. Planned for small bowel video capsule.   Remains in NSR with HR 60s HgB stable today at 8.6  Inpatient Medications    Scheduled Meds: . amLODipine  10 mg Oral Daily  . atorvastatin  80 mg Oral Daily  . calcium acetate  667 mg Oral TID WC  . Chlorhexidine Gluconate Cloth  6 each Topical Q0600  . cinacalcet  60 mg Oral Q M,W,F  . [START ON 12/25/2020] darbepoetin (ARANESP) injection - DIALYSIS  60 mcg Intravenous Q Wed-HD  . ezetimibe  10 mg Oral Daily  . hydrALAZINE  25 mg Oral Q8H  . insulin pump   Subcutaneous Q4H  . pantoprazole (PROTONIX) IV  40 mg Intravenous Q12H   Continuous Infusions: . sodium chloride    . nitroGLYCERIN Stopped (12/19/20 0727)   PRN Meds: acetaminophen   Vital Signs    Vitals:   12/20/20 2004 12/21/20 0033 12/21/20 0455 12/21/20 0641  BP:  (!) 108/49  (!) 145/45  Pulse:      Resp:      Temp: (!) 97.5 F (36.4 C) 98.7 F (37.1 C) 99 F (37.2 C)   TempSrc: Oral Oral Oral   SpO2:      Weight:        Intake/Output Summary (Last 24 hours) at 12/21/2020 0733 Last data filed at 12/20/2020 1800 Gross per 24 hour  Intake 300 ml  Output 2000 ml  Net -1700 ml   Last 3 Weights 12/20/2020 12/20/2020 12/18/2020  Weight (lbs) 129 lb 10.1 oz 134 lb 14.7 oz 126 lb 15.8 oz  Weight (kg) 58.8 kg 61.2 kg 57.6 kg      Telemetry    NSR with HR 60s - Personally Reviewed  ECG    No new tracing - Personally Reviewed  Physical Exam   GEN: No acute distress.   Neck: No JVD Cardiac: RRR, soft systolic murmurs. Left arm AV fistula Respiratory: Clear to auscultation bilaterally. GI: Soft,  nontender, non-distended  MS: No edema; No deformity. Neuro:  Nonfocal  Psych: Normal affect   Labs    High Sensitivity Troponin:   Recent Labs  Lab 12/16/20 0627 12/16/20 1706 12/16/20 2334 12/17/20 0439 12/17/20 0623  TROPONINIHS 48* 137* 562* 745* 735*      Chemistry Recent Labs  Lab 12/15/20 1636 12/15/20 1707 12/19/20 0149 12/20/20 0309 12/21/20 0228  NA 132*   < > 133* 139 137  K 6.2*   < > 4.7 4.3 4.2  CL 94*   < > 97* 103 98  CO2 26   < > '28 24 31  '$ GLUCOSE 345*   < > 183* 87 200*  BUN 67*   < > 25* 35* 15  CREATININE 9.40*   < > 5.08* 7.95* 5.32*  CALCIUM 7.8*   < > 8.2* 8.5* 7.9*  PROT 5.7*  --   --   --   --   ALBUMIN 3.3*   < > 3.5 3.4* 3.3*  AST 18  --   --   --   --   ALT 17  --   --   --   --  ALKPHOS 82  --   --   --   --   BILITOT 0.6  --   --   --   --   GFRNONAA 6*   < > 12* 7* 11*  ANIONGAP 12   < > '8 12 8   '$ < > = values in this interval not displayed.     Hematology Recent Labs  Lab 12/19/20 0149 12/20/20 0309 12/21/20 0228  WBC 4.3 3.7* 3.8*  RBC 2.78* 2.63* 2.57*  HGB 8.9* 8.6* 8.6*  HCT 26.6* 25.6* 25.1*  MCV 95.7 97.3 97.7  MCH 32.0 32.7 33.5  MCHC 33.5 33.6 34.3  RDW 14.6 14.3 14.6  PLT 129* 122* 151    BNP Recent Labs  Lab 12/16/20 0406  BNP 1,991.6*     DDimer No results for input(s): DDIMER in the last 168 hours.   Radiology    No results found.  Cardiac Studies   TTE 11/28/20: 1. Left ventricular ejection fraction, by estimation, is 55 to 60%. The  left ventricle has normal function. The left ventricle has no regional  wall motion abnormalities. There is moderate left ventricular hypertrophy.  Left ventricular diastolic  parameters are consistent with Grade II diastolic dysfunction  (pseudonormalization). Elevated left atrial pressure.  2. Right ventricular systolic function is normal. The right ventricular  size is normal.  3. The mitral valve is normal in structure. Trivial mitral valve   regurgitation. No evidence of mitral stenosis.  4. The aortic valve is tricuspid. Aortic valve regurgitation is trivial.  Mild aortic valve sclerosis is present, with no evidence of aortic valve  stenosis.  5. The inferior vena cava is normal in size with greater than 50%  respiratory variability, suggesting right atrial pressure of 3 mmHg.   Cath 04/17/2019: 1. Occluded SVG-RCA and SVG-ramus, patent LIMA-LAD.  2. The large ramus was patent with 40-50% mid ramus stenosis (in-stent restenosis).  3. 80% ostial stenosis very small caliber AV LCx. Not interventional target.  4. Occluded RCA (known from prior) with collaterals.   No target for intervention. Aggressive medical management of angina.  Myoview 11/15/18:  Baseline EKG showed NSR with LVH and repolarization abnormality in the inferolateral leads. No change in ST segments during Lexiscan infusion.  Nuclear stress EF: 54%.  The left ventricular ejection fraction is mildly decreased (45-54%).  There is a small defect of mild severity present in the basal inferior and mid inferior location. The defect is non-reversible and in the setting of normal LVF is consistent with diaphragmatic attenuation artifact. No ischemia noted.  This is a low risk study.   Patient Profile     65 y.o. male with ahx of HTN, HLD, DMII, Afib, ESRD on HD, CAD s/p CABGwith multiple subsequent PCIs who presented with chest tightness found to be bradycardic to 40s in the setting of BB use responsive to atropine with course complicated by trop rise 48-->745. Cardiology has now been consulted for management of bradycardia and NSTEMI.  Assessment & Plan    #NSTEMI #CAD s/p CABG with multiple subsequent PCIs and caths: Patient with extensive history of CAD s/p CABG in 2017 with several subsequent NSTEMIs/PCIs with last in 2020. Presented with bradycardia and chest tightness with initial troponins in 40s. Trop rose overnight from 100-->500s-->745 this  AM. Currently with continued substernal chest tightness. Notably, this rise correlates with drop in hemoglobinwith concern for GIB suggesting element of demand in the setting of known significant CAD.Currently undergoing work-up of GIB with unrevealing source  thus far. Chest pain resolved and off nitro gtt. -Manage GIB and anemia per GI and primary team; see below -Holding antiplatelet agents and AC due to GIB; resume once able -Off nitro gtt; monitor for recurrence of chest pain -Continuelipitor '80mg'$  daily and zetia '10mg'$  daily -Holding metop due to bradycardia -Start imdur '30mg'$  daily and ranexa '500mg'$  BID -After discussion with GI, given high risk of recurrent GI bleed as no source identified and lack of chest pain, will continue medical management of CAD at this time. If recurrent chest pain despite improvement in anemia, can re-discuss cath  #Symptomatic Bradycardia: Resolveds/p holding metop.  -Stopped metop -Monitor on telemetry  #Chronic Diastolic Heart Failure: LVEF 55-60%, moderate LVH, G2DD.Does not appear acutely decompensated. -Continue volume manangement with HD -Hold metop due to symptomatic bradycardia -Follows with HF clinic  #Acute on chronic anemia #GIB: -EGD with duodenal ulcer and gastritis with no active bleeding -Colonoscopy with blood throughout but no active bleeding; polyp removed -Undergoing capsule study -Holding eliquis; likely will challenge with heparin on Monday if no recurrent bleed  #Atrial Fibrillation: CHADs-vasc 4 -Holding eliquis givenGIB as above -Hold BBdue to symptomatic bradycardia as above  #HTN: -On amlodipine -Off BB due to bradycardia  #HLD: LDL 52. -Continueatorvastatin '80mg'$  daily and zetia '10mg'$  daily  #Carotid Stenosis: Mild on recent carotid ultrasound. -Continue statin and zetia as above  #ESRD: -Continue scheduled HD  #DMII: -On insulin       For questions or updates, please contact [facility] Please consult [email or web address] for contact info under        Signed, Freada Bergeron, MD  12/21/2020, 7:33 AM

## 2020-12-20 NOTE — Progress Notes (Signed)
Spoke with Cardiology, Gwyndolyn Kaufman to see if patient still needs nitro drip or to leave discontinued.  She stated that the patient only needs the nitro drip if he has chest pain, which he does not at this time so will leave nitro drip discontinued.  Will continue to monitor.

## 2020-12-20 NOTE — Progress Notes (Signed)
Progress Note  Patient Name: Kenneth [name] Date of Encounter: 12/20/2020  [facility] Cardiologist: No primary care provider on file.   Subjective   Doing well. Chest pain free. Off nitro.   EGD with ulcer in the duodenal bulb as well as gastritis without active bleeding. Plan for colonoscopy today.  Hgb stable 8.6  Inpatient Medications    Scheduled Meds: . [MAR Hold] amLODipine  10 mg Oral Daily  . [MAR Hold] atorvastatin  80 mg Oral Daily  . [MAR Hold] calcium acetate  667 mg Oral TID WC  . [MAR Hold] Chlorhexidine Gluconate Cloth  6 each Topical Q0600  . [MAR Hold] cinacalcet  60 mg Oral Q M,W,F  . [MAR Hold] darbepoetin (ARANESP) injection - DIALYSIS  60 mcg Intravenous Q Wed-HD  . [MAR Hold] ezetimibe  10 mg Oral Daily  . [MAR Hold] hydrALAZINE  25 mg Oral Q8H  . [MAR Hold] insulin aspart  0-9 Units Subcutaneous Q4H  . [MAR Hold] insulin aspart  5 Units Intravenous Once  . [MAR Hold] insulin pump   Subcutaneous Q4H  . [MAR Hold] pantoprazole (PROTONIX) IV  40 mg Intravenous Q12H   Continuous Infusions: . [MAR Hold] sodium chloride    . [MAR Hold] sodium chloride    . [MAR Hold] sodium chloride    . [MAR Hold] nitroGLYCERIN Stopped (12/19/20 0727)   PRN Meds: [MAR Hold] sodium chloride, [MAR Hold] sodium chloride, [MAR Hold] acetaminophen, [MAR Hold] alteplase, [MAR Hold] heparin, [MAR Hold] lidocaine (PF), [MAR Hold] lidocaine-prilocaine, [MAR Hold] pentafluoroprop-tetrafluoroeth   Vital Signs    Vitals:   12/20/20 0359 12/20/20 0749 12/20/20 0808 12/20/20 0830  BP: (!) 167/55 (!) 150/54 (!) 158/51 (!) 173/61  Pulse: 85 86 84 82  Resp: 15 (!) 22  17  Temp: 98.6 F (37 C) 98.7 F (37.1 C)  98.5 F (36.9 C)  TempSrc: Oral Oral  Oral  SpO2: 97% 99% 98% 100%  Weight:        Intake/Output Summary (Last 24 hours) at 12/20/2020 0852 Last data filed at 12/19/2020 1350 Gross per 24 hour  Intake 150 ml  Output --  Net 150 ml   Last 3 Weights 12/18/2020 12/18/2020  11/28/2020  Weight (lbs) 126 lb 15.8 oz 134 lb 4.2 oz 133 lb  Weight (kg) 57.6 kg 60.9 kg 60.328 kg      Telemetry    NSR - Personally Reviewed  ECG    No new tracing - Personally Reviewed  Physical Exam   GEN: No acute distress.   Neck: No JVD Cardiac: RRR, soft murmur, +AV fistula Respiratory: Clear to auscultation bilaterally. GI: Soft, nontender, non-distended  MS: No edema; No deformity. Neuro:  Nonfocal  Psych: Normal affect   Labs    High Sensitivity Troponin:   Recent Labs  Lab 12/16/20 0627 12/16/20 1706 12/16/20 2334 12/17/20 0439 12/17/20 0623  TROPONINIHS 48* 137* 562* 745* 735*      Chemistry Recent Labs  Lab 12/15/20 1636 12/15/20 1707 12/18/20 0253 12/19/20 0149 12/20/20 0309  NA 132*   < > 136 133* 139  K 6.2*   < > 4.1 4.7 4.3  CL 94*   < > 98 97* 103  CO2 26   < > '30 28 24  '$ GLUCOSE 345*   < > 79 183* 87  BUN 67*   < > 28* 25* 35*  CREATININE 9.40*   < > 6.26* 5.08* 7.95*  CALCIUM 7.8*   < > 8.6* 8.2*  8.5*  PROT 5.7*  --   --   --   --   ALBUMIN 3.3*   < > 3.2* 3.5 3.4*  AST 18  --   --   --   --   ALT 17  --   --   --   --   ALKPHOS 82  --   --   --   --   BILITOT 0.6  --   --   --   --   GFRNONAA 6*   < > 9* 12* 7*  ANIONGAP 12   < > '8 8 12   '$ < > = values in this interval not displayed.     Hematology Recent Labs  Lab 12/18/20 0253 12/19/20 0149 12/20/20 0309  WBC 4.1 4.3 3.7*  RBC 2.52* 2.78* 2.63*  HGB 8.3* 8.9* 8.6*  HCT 24.4* 26.6* 25.6*  MCV 96.8 95.7 97.3  MCH 32.9 32.0 32.7  MCHC 34.0 33.5 33.6  RDW 14.8 14.6 14.3  PLT 98* 129* 122*    BNP Recent Labs  Lab 12/16/20 0406  BNP 1,991.6*     DDimer No results for input(s): DDIMER in the last 168 hours.   Radiology    No results found.  Cardiac Studies   TTE 11/28/20: 1. Left ventricular ejection fraction, by estimation, is 55 to 60%. The  left ventricle has normal function. The left ventricle has no regional  wall motion abnormalities. There is  moderate left ventricular hypertrophy.  Left ventricular diastolic  parameters are consistent with Grade II diastolic dysfunction  (pseudonormalization). Elevated left atrial pressure.  2. Right ventricular systolic function is normal. The right ventricular  size is normal.  3. The mitral valve is normal in structure. Trivial mitral valve  regurgitation. No evidence of mitral stenosis.  4. The aortic valve is tricuspid. Aortic valve regurgitation is trivial.  Mild aortic valve sclerosis is present, with no evidence of aortic valve  stenosis.  5. The inferior vena cava is normal in size with greater than 50%  respiratory variability, suggesting right atrial pressure of 3 mmHg.   Cath 04/17/2019: 1. Occluded SVG-RCA and SVG-ramus, patent LIMA-LAD.  2. The large ramus was patent with 40-50% mid ramus stenosis (in-stent restenosis).  3. 80% ostial stenosis very small caliber AV LCx. Not interventional target.  4. Occluded RCA (known from prior) with collaterals.   No target for intervention. Aggressive medical management of angina.  Myoview 11/15/18:  Baseline EKG showed NSR with LVH and repolarization abnormality in the inferolateral leads. No change in ST segments during Lexiscan infusion.  Nuclear stress EF: 54%.  The left ventricular ejection fraction is mildly decreased (45-54%).  There is a small defect of mild severity present in the basal inferior and mid inferior location. The defect is non-reversible and in the setting of normal LVF is consistent with diaphragmatic attenuation artifact. No ischemia noted.  This is a low risk study.   Patient Profile     65 y.o. male with a hx of HTN, HLD, DMII, Afib, ESRD on HD, CAD s/p CABGwith multiple subsequent PCIs who presented with chest tightness found to be bradycardic to 40s in the setting of BB use responsive to atropine with course complicated by trop rise 48-->745. Cardiology has now been consulted for management of  bradycardia and NSTEMI.  Assessment & Plan      #NSTEMI #CAD s/p CABG with multiple subsequent PCIs and caths: Patient with extensive history of CAD s/p CABG in 2017 with  several subsequent NSTEMIs/PCIs with last in 2020. Presented with bradycardia and chest tightness with initial troponins in 40s. Trop rose overnight from 100-->500s-->745 this AM. Currently with continued substernal chest tightness. Notably, this rise correlates with drop in hemoglobinwith concern for GIB suggesting element of demand in the setting of known significant CAD.Currently undergoing work-up of GIB and once stable from that standpoint, can pursue ischemic work-up -Manage GIB and anemia per GI and primary team -Holding antiplatelet agents and AC due to GIB; resume once able -Off nitro gtt; monitor for recurrence of chest pain -Continuelipitor '80mg'$  daily and zetia '10mg'$  daily -Holding metop due to bradycardia -Once stable from GIB standpoint, likely will need repeat coronary angiography given significant rise in troponin and known CAD   #Symptomatic Bradycardia: Resolved s/p holding metop.  -Stopped metop -Monitor on telemetry  #Chronic Diastolic Heart Failure: LVEF 55-60%, moderate LVH, G2DD.Does not appear acutely decompensated. -Continue volume manangement with HD -Hold metop due to symptomatic bradycardia -Follows with HF clinic  #Acute on chronic anemia #GIB: -EGD with duodenal ulcer and gastritis with no active bleeding -Planned for colonscopy today -Holding eliquis  #Atrial Fibrillation: CHADs-vasc 4 -Holding eliquis givenGIB as above -Hold BBdue to symptomatic bradycardia as above  #HTN: -On amlodipine -Off BB due to bradycardia  #HLD: LDL 52. -Continueatorvastatin '80mg'$  daily and zetia '10mg'$  daily  #Carotid Stenosis: Mild on recent carotid ultrasound. -Continue statin and zetia as above  #ESRD: -Continue scheduled HD  #DMII: -On insulin      For questions or  updates, please contact [facility] Please consult [email or web address] for contact info under        Signed, Freada Bergeron, MD  12/20/2020, 8:52 AM

## 2020-12-20 NOTE — Anesthesia Postprocedure Evaluation (Signed)
Anesthesia Post Note  Patient: Kenneth [name]  Procedure(s) Performed: COLONOSCOPY WITH PROPOFOL (N/A ) POLYPECTOMY GIVENS CAPSULE STUDY (N/A )     Patient location during evaluation: Endoscopy Anesthesia Type: MAC Level of consciousness: awake and alert Pain management: pain level controlled Vital Signs Assessment: post-procedure vital signs reviewed and stable Respiratory status: spontaneous breathing, nonlabored ventilation, respiratory function stable and patient connected to nasal cannula oxygen Cardiovascular status: blood pressure returned to baseline and stable Postop Assessment: no apparent nausea or vomiting Anesthetic complications: no   No complications documented.  Last Vitals:  Vitals:   12/20/20 0956 12/20/20 1006  BP: (!) 145/41 (!) 136/46  Pulse:  63  Resp: 10 16  Temp: (!) 35.9 C   SpO2: 100% 100%    Last Pain:  Vitals:   12/20/20 1006  TempSrc:   PainSc: 0-No pain                 Barnet Glasgow

## 2020-12-20 NOTE — Progress Notes (Signed)
Patient ID: Kenneth [name], male   DOB: Dec 10, 1955, 65 y.o.   MRN: [id number]  PROGRESS NOTE    Kenneth [name]  [id number] DOB: 09/24/1955 DOA: 12/15/2020 PCP: [name], MD   Brief Narrative:  65 y.o.malewith medical history significant of HLD, HTN, DM1, A.fib, ESRD on HD, CAD sp CABG presented with CP tightness and weakness along with intermittent bloody stools.  On EMS arrival, patient was bradycardic into the 40s and poorly responsive for which she was given 1 mg of atropine and heart rate went up to 40 to 60s.  In the ED, potassium was 6.2, EKG showed junctional rhythm; he was given Lokelma, glucose and insulin.  Nephrology is consulted and he subsequently had HD.  He also was found to have hemoglobin drop of 2 units with Hemoccult positive stool.  GI was consulted.  Assessment & Plan:   Symptomatic bradycardia -Status post atropine on presentation.  Heart rate in the 60s to 70s currently.  Beta-blocker on hold. -EKG showed no acute ST changes -Echo in 3/22 showed EF of 55 to 60% with grade 2 diastolic dysfunction -Cardiology following  Possible non-STEMI versus demand ischemia in a patient with history of CAD status post CABG Hyperlipidemia -Troponin peaked up to more than 700.  EKG with no acute ST changes.  Cardiology following.  Continue statin and Zetia.  Imdur, losartan and metoprolol on hold.  Cardiology plans for cardiac cath once cleared by GI  GI bleeding Anemia of chronic kidney disease -Baseline hemoglobin around 9-10.  Hemoglobin dropped to 6.7.  Status post 1 unit packed red cell transfusion on 12/17/2020.  Hemoglobin 8.6 today -Continue PPI IV twice a day.  Eliquis on hold.   -Status post EGD on 12/19/2020 which showed erythematous mucosa in stomach which was biopsied and erythematous to adenopathy.  GI planning for colonoscopy repeated  End-stage renal disease on hemodialysis Hyperkalemia: Resolved -Nephrology following.  Dialysis as per nephrology  schedule  Chronic diastolic heart failure -Volume management as per hemodialysis  Diabetes mellitus type 1 with hypoglycemia -A1c 5.9.  Continue insulin pump.  Diabetes coordinator following.  Continue CBG checks  Paroxysmal A. Fib -Currently rate controlled.  Metoprolol and Eliquis on hold  Hypertension -Blood pressure intermittently elevated.  Increase amlodipine to 10 mg daily.  Resume hydralazine.  Generalized weakness--PT/OT eval   DVT prophylaxis: SCDs Code Status: Full Family Communication: None at bedside Disposition Plan: Status is: Inpatient  Remains inpatient appropriate because:Inpatient level of care appropriate due to severity of illness   Dispo: The patient is from: Home              Anticipated d/c is to: Home              Patient currently is not medically stable to d/c.   Difficult to place patient No  Consultants: Nephrology/GI/cardiology  Procedures:  EGD on 12/19/2020 Impression:               - Normal esophagus.                           - Erythematous mucosa in the stomach. Biopsied.                           - Erythematous duodenopathy.                           -  Duodenal deformity. Recommendation:           - Return patient to hospital ward for ongoing care.                           - The signs and symptoms of potential delayed                            complications were discussed with the patient.                           - Clear liquid diet today.                           - Continue present medications.                           - Continue to hold Eliquis.                           - Await pathology results.                           - Perform a colonoscopy tomorrow.  Antimicrobials: None   Subjective: Patient seen and examined at bedside.  Denies worsening shortness of breath, fever, abdominal pain or vomiting.  Objective: Vitals:   12/19/20 2015 12/19/20 2100 12/19/20 2317 12/20/20 0359  BP: (!) 189/44 (!) 208/47 (!) 173/41 (!)  167/55  Pulse: 70  70 85  Resp: '18  19 15  '$ Temp: 98 F (36.7 C)  98 F (36.7 C) 98.6 F (37 C)  TempSrc: Oral  Oral Oral  SpO2: 100%  100% 97%  Weight:        Intake/Output Summary (Last 24 hours) at 12/20/2020 0735 Last data filed at 12/19/2020 1350 Gross per 24 hour  Intake 150 ml  Output --  Net 150 ml   Filed Weights   12/18/20 0810 12/18/20 1200  Weight: 60.9 kg 57.6 kg    Examination:  General exam: Currently on room air.  No distress. respiratory system: Bilateral decreased breath sounds at bases with some scattered crackles  cardiovascular system: S1-S2 heard, rate controlled Gastrointestinal system: Abdomen is distended slightly, soft and nontender.  Normal bowel sounds heard  extremities: No cyanosis; bilateral lower extremity edema present  Central nervous system: Awake and alert.  No focal neurological deficits.  Moving extremities  skin: No obvious lesions/petechiae psychiatry: Affect is flat   Data Reviewed: I have personally reviewed following labs and imaging studies  CBC: Recent Labs  Lab 12/16/20 0857 12/16/20 1600 12/17/20 0439 12/17/20 1143 12/18/20 0253 12/19/20 0149 12/20/20 0309  WBC 4.5 5.0 3.8*  --  4.1 4.3 3.7*  NEUTROABS 2.9  --  2.3  --  2.2 2.5 2.3  HGB 7.4* 7.3* 6.7* 8.2* 8.3* 8.9* 8.6*  HCT 22.1* 22.6* 20.2* 24.6* 24.4* 26.6* 25.6*  MCV 98.7 100.0 98.5  --  96.8 95.7 97.3  PLT 103* 112* 104*  --  98* 129* 123XX123*   Basic Metabolic Panel: Recent Labs  Lab 12/16/20 0857 12/17/20 0439 12/18/20 0253 12/19/20 0149 12/20/20 0309  NA 136 135 136 133* 139  K 5.0 3.8 4.1 4.7 4.3  CL 98 97* 98 97* 103  CO2 29 32 '30 28 24  '$ GLUCOSE 128* 218* 79 183* 87  BUN 32* 14 28* 25* 35*  CREATININE 6.04* 3.67* 6.26* 5.08* 7.95*  CALCIUM 8.7* 7.9* 8.6* 8.2* 8.5*  PHOS 4.3 2.9 5.6* 4.1 4.2   GFR: CrCl cannot be calculated (Unknown ideal weight.). Liver Function Tests: Recent Labs  Lab 12/15/20 1636 12/16/20 0857 12/17/20 0439  12/18/20 0253 12/19/20 0149 12/20/20 0309  AST 18  --   --   --   --   --   ALT 17  --   --   --   --   --   ALKPHOS 82  --   --   --   --   --   BILITOT 0.6  --   --   --   --   --   PROT 5.7*  --   --   --   --   --   ALBUMIN 3.3* 3.2* 3.1* 3.2* 3.5 3.4*   No results for input(s): LIPASE, AMYLASE in the last 168 hours. No results for input(s): AMMONIA in the last 168 hours. Coagulation Profile: Recent Labs  Lab 12/15/20 1636  INR 1.3*   Cardiac Enzymes: No results for input(s): CKTOTAL, CKMB, CKMBINDEX, TROPONINI in the last 168 hours. BNP (last 3 results) No results for input(s): PROBNP in the last 8760 hours. HbA1C: No results for input(s): HGBA1C in the last 72 hours. CBG: Recent Labs  Lab 12/19/20 1609 12/19/20 2014 12/19/20 2316 12/20/20 0358 12/20/20 0729  GLUCAP 250* 290* 204* 103* 135*   Lipid Profile: No results for input(s): CHOL, HDL, LDLCALC, TRIG, CHOLHDL, LDLDIRECT in the last 72 hours. Thyroid Function Tests: No results for input(s): TSH, T4TOTAL, FREET4, T3FREE, THYROIDAB in the last 72 hours. Anemia Panel: No results for input(s): VITAMINB12, FOLATE, FERRITIN, TIBC, IRON, RETICCTPCT in the last 72 hours. Sepsis Labs: No results for input(s): PROCALCITON, LATICACIDVEN in the last 168 hours.  Recent Results (from the past 240 hour(s))  Resp Panel by RT-PCR (Flu A&B, Covid) Nasopharyngeal Swab     Status: None   Collection Time: 12/15/20  8:57 PM   Specimen: Nasopharyngeal Swab; Nasopharyngeal(NP) swabs in vial transport medium  Result Value Ref Range Status   SARS Coronavirus 2 by RT PCR NEGATIVE NEGATIVE Final    Comment: (NOTE) SARS-CoV-2 target nucleic acids are NOT DETECTED.  The SARS-CoV-2 RNA is generally detectable in upper respiratory specimens during the acute phase of infection. The lowest concentration of SARS-CoV-2 viral copies this assay can detect is 138 copies/mL. A negative result does not preclude SARS-Cov-2 infection and  should not be used as the sole basis for treatment or other patient management decisions. A negative result may occur with  improper specimen collection/handling, submission of specimen other than nasopharyngeal swab, presence of viral mutation(s) within the areas targeted by this assay, and inadequate number of viral copies(<138 copies/mL). A negative result must be combined with clinical observations, patient history, and epidemiological information. The expected result is Negative.  Fact Sheet for Patients:  EntrepreneurPulse.com.au  Fact Sheet for Healthcare Providers:  IncredibleEmployment.be  This test is no t yet approved or cleared by the [place] FDA and  has been authorized for detection and/or diagnosis of SARS-CoV-2 by FDA under an Emergency Use Authorization (EUA). This EUA will remain  in effect (meaning this test can be used) for the duration of the COVID-19 declaration under Section 564(b)(1) of the Act, 21 U.S.C.section 360bbb-3(b)(1), unless the authorization is terminated  or  revoked sooner.       Influenza A by PCR NEGATIVE NEGATIVE Final   Influenza B by PCR NEGATIVE NEGATIVE Final    Comment: (NOTE) The Xpert Xpress SARS-CoV-2/FLU/RSV plus assay is intended as an aid in the diagnosis of influenza from Nasopharyngeal swab specimens and should not be used as a sole basis for treatment. Nasal washings and aspirates are unacceptable for Xpert Xpress SARS-CoV-2/FLU/RSV testing.  Fact Sheet for Patients: EntrepreneurPulse.com.au  Fact Sheet for Healthcare Providers: IncredibleEmployment.be  This test is not yet approved or cleared by the [place] FDA and has been authorized for detection and/or diagnosis of SARS-CoV-2 by FDA under an Emergency Use Authorization (EUA). This EUA will remain in effect (meaning this test can be used) for the duration of the COVID-19 declaration  under Section 564(b)(1) of the Act, 21 U.S.C. section 360bbb-3(b)(1), unless the authorization is terminated or revoked.  Performed at [facility], [place] [address] [place], [place] [address]          Radiology Studies: No results found.      Scheduled Meds: . amLODipine  5 mg Oral Daily  . atorvastatin  80 mg Oral Daily  . calcium acetate  667 mg Oral TID WC  . Chlorhexidine Gluconate Cloth  6 each Topical Q0600  . cinacalcet  60 mg Oral Q M,W,F  . [START ON 12/25/2020] darbepoetin (ARANESP) injection - DIALYSIS  60 mcg Intravenous Q Wed-HD  . ezetimibe  10 mg Oral Daily  . insulin aspart  0-9 Units Subcutaneous Q4H  . insulin aspart  5 Units Intravenous Once  . insulin pump   Subcutaneous Q4H  . pantoprazole (PROTONIX) IV  40 mg Intravenous Q12H   Continuous Infusions: . sodium chloride    . sodium chloride    . sodium chloride    . nitroGLYCERIN Stopped (12/19/20 [id number])          Aline August, MD [facility] 12/20/2020, 7:35 AM

## 2020-12-20 NOTE — Progress Notes (Signed)
0820 am: Patient went for colonoscopy via wheel chair.  1045 am: Patient back to floor via wheel chair. No CO pain or discomfort. Written instruction for care after procedure received with the patient. Will follow instructions. VS checked and tele applied. Patient resting comfortably in the bed.

## 2020-12-20 NOTE — Progress Notes (Signed)
Givens capsule ordered by Dr. Silverio Decamp to evaluate for source of bleeding. Patient swallowed givens capsule at 1026. Instructions provided to patient through interpreter. Instructions provided to floor nurse via phone.

## 2020-12-20 NOTE — Progress Notes (Signed)
Patient back from dialysis. No CO pain or discomfort. NAD noted. VS checked and WNL.

## 2020-12-21 DIAGNOSIS — R001 Bradycardia, unspecified: Secondary | ICD-10-CM | POA: Diagnosis not present

## 2020-12-21 DIAGNOSIS — I480 Paroxysmal atrial fibrillation: Secondary | ICD-10-CM | POA: Diagnosis not present

## 2020-12-21 DIAGNOSIS — I25118 Atherosclerotic heart disease of native coronary artery with other forms of angina pectoris: Secondary | ICD-10-CM | POA: Diagnosis not present

## 2020-12-21 DIAGNOSIS — K921 Melena: Secondary | ICD-10-CM | POA: Diagnosis not present

## 2020-12-21 DIAGNOSIS — R079 Chest pain, unspecified: Secondary | ICD-10-CM | POA: Diagnosis not present

## 2020-12-21 LAB — CBC WITH DIFFERENTIAL/PLATELET
Abs Immature Granulocytes: 0.01 10*3/uL (ref 0.00–0.07)
Basophils Absolute: 0 10*3/uL (ref 0.0–0.1)
Basophils Relative: 1 %
Eosinophils Absolute: 0.2 10*3/uL (ref 0.0–0.5)
Eosinophils Relative: 5 %
HCT: 25.1 % — ABNORMAL LOW (ref 39.0–52.0)
Hemoglobin: 8.6 g/dL — ABNORMAL LOW (ref 13.0–17.0)
Immature Granulocytes: 0 %
Lymphocytes Relative: 23 %
Lymphs Abs: 0.9 10*3/uL (ref 0.7–4.0)
MCH: 33.5 pg (ref 26.0–34.0)
MCHC: 34.3 g/dL (ref 30.0–36.0)
MCV: 97.7 fL (ref 80.0–100.0)
Monocytes Absolute: 0.6 10*3/uL (ref 0.1–1.0)
Monocytes Relative: 15 %
Neutro Abs: 2.1 10*3/uL (ref 1.7–7.7)
Neutrophils Relative %: 56 %
Platelets: 151 10*3/uL (ref 150–400)
RBC: 2.57 MIL/uL — ABNORMAL LOW (ref 4.22–5.81)
RDW: 14.6 % (ref 11.5–15.5)
WBC: 3.8 10*3/uL — ABNORMAL LOW (ref 4.0–10.5)
nRBC: 0 % (ref 0.0–0.2)

## 2020-12-21 LAB — GLUCOSE, CAPILLARY
Glucose-Capillary: 103 mg/dL — ABNORMAL HIGH (ref 70–99)
Glucose-Capillary: 135 mg/dL — ABNORMAL HIGH (ref 70–99)
Glucose-Capillary: 176 mg/dL — ABNORMAL HIGH (ref 70–99)
Glucose-Capillary: 215 mg/dL — ABNORMAL HIGH (ref 70–99)
Glucose-Capillary: 226 mg/dL — ABNORMAL HIGH (ref 70–99)
Glucose-Capillary: 340 mg/dL — ABNORMAL HIGH (ref 70–99)

## 2020-12-21 LAB — RENAL FUNCTION PANEL
Albumin: 3.3 g/dL — ABNORMAL LOW (ref 3.5–5.0)
Anion gap: 8 (ref 5–15)
BUN: 15 mg/dL (ref 8–23)
CO2: 31 mmol/L (ref 22–32)
Calcium: 7.9 mg/dL — ABNORMAL LOW (ref 8.9–10.3)
Chloride: 98 mmol/L (ref 98–111)
Creatinine, Ser: 5.32 mg/dL — ABNORMAL HIGH (ref 0.61–1.24)
GFR, Estimated: 11 mL/min — ABNORMAL LOW (ref >60)
Glucose, Bld: 200 mg/dL — ABNORMAL HIGH (ref 70–99)
Phosphorus: 4.1 mg/dL (ref 2.5–4.6)
Potassium: 4.2 mmol/L (ref 3.5–5.1)
Sodium: 137 mmol/L (ref 135–145)

## 2020-12-21 MED ORDER — RANOLAZINE ER 500 MG PO TB12
500.0000 mg | ORAL_TABLET | Freq: Two times a day (BID) | ORAL | Status: DC
  Administered 2020-12-21: 500 mg via ORAL
  Filled 2020-12-21 (×2): qty 1

## 2020-12-21 MED ORDER — ISOSORBIDE MONONITRATE ER 30 MG PO TB24: 30.0000 mg | ORAL_TABLET | Freq: Every day | ORAL | Status: DC

## 2020-12-21 NOTE — Progress Notes (Addendum)
Progress Note  Patient Name: Kenneth [name] Date of Encounter: 12/22/2020  [facility] Cardiologist: No primary care provider on file.   Subjective   Patient feels well this morning. No chest pain. Hoping to go home soon. Discussed the plan for medical management of his CAD for now and the concern for recurrent GIB with the patient at length with use of the [name].  EGD, colonoscopy and small bowel video capsule without obvious source of bleed. Concern for possible small AVM as source. Discussion held with Dr. Loletha Carrow and given significant risk of re-bleed, the patient is very high risk for DAPT/dual or triple therapy. Given that his chest pain has resolved and he remains stable off nitro gtt without intervenable disease on last cath in 2020, will try medical management of underlying CAD at this time. If fails, will need to pursue cath.  Hemoglobin 8.6-->7.9   Inpatient Medications    Scheduled Meds: . amLODipine  10 mg Oral Daily  . atorvastatin  80 mg Oral Daily  . calcium acetate  667 mg Oral TID WC  . Chlorhexidine Gluconate Cloth  6 each Topical Q0600  . cinacalcet  60 mg Oral Q M,W,F  . [START ON 12/25/2020] darbepoetin (ARANESP) injection - DIALYSIS  60 mcg Intravenous Q Wed-HD  . ezetimibe  10 mg Oral Daily  . hydrALAZINE  25 mg Oral Q8H  . insulin pump   Subcutaneous Q4H  . isosorbide mononitrate  30 mg Oral Daily  . pantoprazole (PROTONIX) IV  40 mg Intravenous Q12H  . ranolazine  500 mg Oral BID   Continuous Infusions: . sodium chloride     PRN Meds: acetaminophen   Vital Signs    Vitals:   12/21/20 2012 12/22/20 0016 12/22/20 0042 12/22/20 0432  BP:  (!) 133/34 (!) 140/52 (!) 167/49  Pulse: 68 63  64  Resp: '20 12  17  '$ Temp: 99.1 F (37.3 C) 98.6 F (37 C)  98.2 F (36.8 C)  TempSrc: Oral Oral  Oral  SpO2: 100% 99%  100%  Weight:       No intake or output data in the 24 hours ending 12/22/20 0727 Last 3 Weights 12/20/2020 12/20/2020 12/18/2020   Weight (lbs) 129 lb 10.1 oz 134 lb 14.7 oz 126 lb 15.8 oz  Weight (kg) 58.8 kg 61.2 kg 57.6 kg      Telemetry    NSR - Personally Reviewed  ECG    No new tracing - Personally Reviewed  Physical Exam   GEN: No acute distress.   Neck: No JVD Cardiac: RRR, 2/6 systolic murmur, +AV fistula in left arm Respiratory: Clear to auscultation bilaterally. GI: Soft, nontender, non-distended  MS: No edema; No deformity. Neuro:  Nonfocal  Psych: Normal affect   Labs    High Sensitivity Troponin:   Recent Labs  Lab 12/16/20 0627 12/16/20 1706 12/16/20 2334 12/17/20 0439 12/17/20 0623  TROPONINIHS 48* 137* 562* 745* 735*      Chemistry Recent Labs  Lab 12/15/20 1636 12/15/20 1707 12/20/20 0309 12/21/20 0228 12/22/20 0111  NA 132*   < > 139 137 134*  K 6.2*   < > 4.3 4.2 4.0  CL 94*   < > 103 98 99  CO2 26   < > '24 31 27  '$ GLUCOSE 345*   < > 87 200* 143*  BUN 67*   < > 35* 15 31*  CREATININE 9.40*   < > 7.95* 5.32* 7.74*  CALCIUM 7.8*   < >  8.5* 7.9* 7.8*  PROT 5.7*  --   --   --   --   ALBUMIN 3.3*   < > 3.4* 3.3* 3.3*  AST 18  --   --   --   --   ALT 17  --   --   --   --   ALKPHOS 82  --   --   --   --   BILITOT 0.6  --   --   --   --   GFRNONAA 6*   < > 7* 11* 7*  ANIONGAP 12   < > '12 8 8   '$ < > = values in this interval not displayed.     Hematology Recent Labs  Lab 12/20/20 0309 12/21/20 0228 12/22/20 0111  WBC 3.7* 3.8* 4.6  RBC 2.63* 2.57* 2.40*  HGB 8.6* 8.6* 7.9*  HCT 25.6* 25.1* 23.9*  MCV 97.3 97.7 99.6  MCH 32.7 33.5 32.9  MCHC 33.6 34.3 33.1  RDW 14.3 14.6 14.5  PLT 122* 151 150    BNP Recent Labs  Lab 12/16/20 0406  BNP 1,991.6*     DDimer No results for input(s): DDIMER in the last 168 hours.   Radiology    No results found.  Cardiac Studies   TTE 11/28/20: 1. Left ventricular ejection fraction, by estimation, is 55 to 60%. The  left ventricle has normal function. The left ventricle has no regional  wall motion  abnormalities. There is moderate left ventricular hypertrophy.  Left ventricular diastolic  parameters are consistent with Grade II diastolic dysfunction  (pseudonormalization). Elevated left atrial pressure.  2. Right ventricular systolic function is normal. The right ventricular  size is normal.  3. The mitral valve is normal in structure. Trivial mitral valve  regurgitation. No evidence of mitral stenosis.  4. The aortic valve is tricuspid. Aortic valve regurgitation is trivial.  Mild aortic valve sclerosis is present, with no evidence of aortic valve  stenosis.  5. The inferior vena cava is normal in size with greater than 50%  respiratory variability, suggesting right atrial pressure of 3 mmHg.   Cath 04/17/2019: 1. Occluded SVG-RCA and SVG-ramus, patent LIMA-LAD.  2. The large ramus was patent with 40-50% mid ramus stenosis (in-stent restenosis).  3. 80% ostial stenosis very small caliber AV LCx. Not interventional target.  4. Occluded RCA (known from prior) with collaterals.   No target for intervention. Aggressive medical management of angina.  Myoview 11/15/18:  Baseline EKG showed NSR with LVH and repolarization abnormality in the inferolateral leads. No change in ST segments during Lexiscan infusion.  Nuclear stress EF: 54%.  The left ventricular ejection fraction is mildly decreased (45-54%).  There is a small defect of mild severity present in the basal inferior and mid inferior location. The defect is non-reversible and in the setting of normal LVF is consistent with diaphragmatic attenuation artifact. No ischemia noted.  This is a low risk study.   Small bowel video capsule study 12/23/20 Small bowel video capsule study shows the capsule passing quickly into the duodenum.  There is fair visualization of the small bowel since there is retained fluid and debris.  Overall that is typical for video capsule study.  Relatively fast transit time about 2 hours and 25  minutes into the colon, at which time no fresh or old blood was seen in the right colon.  Little or no mucosal visualization of the colon is obtained on this study.  There was no fresh or  old blood throughout the small bowel.  No mucosal abnormality such as AVM tumor or inflammation were visualized in the small bowel.  Colonoscopy 12/20/20: No source of bleed; polyp removed. Coffee ground vs melena seen through the colon.  EGD 12/19/20: moderate post-ulcer deformity in the duodenal bulb, erythematous mucosa in the stomach; no active source of bleeding  Patient Profile     65 y.o. male with ahx of HTN, HLD, DMII, Afib, ESRD on HD, CAD s/p CABGwith multiple subsequent PCIs who presented with chest tightness found to be bradycardic to 40s in the setting of BB use responsive to atropine with course complicated by trop rise 48-->745. Cardiology has now been consulted for management of bradycardia and NSTEMI.  Assessment & Plan    #NSTEMI #CAD s/p CABG with multiple subsequent PCIs and caths: Patient with extensive history of CAD s/p CABG in 2017 with several subsequent NSTEMIs/PCIs with last in 2020. Presented with bradycardia and chest tightness with initial troponins in 40s. Symptoms resolved with holding beta blocker, however, his hospital course complicated by recurrent chest tightness with associated rise in troponin from 100-->500s-->745 requiring nitro gtt. Notably, his recurrent chest tightness and rise in troponin correlated with worsening anemia/GIB suggesting an element of demand in the setting of known significant CAD.After discussion with GI as detailed above, given very high risk of recurrent GI bleed and current clinical stability from a CV standpoint (patient is chest pain free, off nitro gtt) will continue medical management of CAD at this time. If recurrent chest pain despite improvement in anemia, will need coronary angiography. -Will try to optimize antianginals at this time and only  pursue cath if recurrent chest pain despite medical management given significant risk of recurrent GIB -Continue to hold apixaban/ASA--will likely re-challenge with heparin gtt on Monday and monitor for recurrent GIB -Favor starting with just apixaban on discharge given very high risk of recurrent GIB and no recent coronary interventions; can consider adding ASA in the future as needed pending clinical stability -Off nitro gtt; monitor for recurrence of chest pain -Continuelipitor '80mg'$  daily and zetia '10mg'$  daily -Off metop due to symptomatic bradycardia which resolved with stopping the med -Increase imdur to '60mg'$  daily -Continue ranexa to '500mg'$  BID (not dialyzable so likely just stay on '500mg'$  BID)  #Symptomatic Bradycardia: Resolveds/p holding metop.  -Stopped metop -Monitor on telemetry  #Chronic Diastolic Heart Failure: LVEF 55-60%, moderate LVH, G2DD.Does not appear acutely decompensated. -Continue volume manangement with HD -Hold metop due to symptomatic bradycardia -Management of NSTEMI as above -Follows with HF clinic  #Acute on chronic anemia #GIB: -EGD with evidence of old duodenal bulb ulcer and gastritis with no active bleeding -Colonoscopy with blood throughout but no active bleeding; polyp removed -Capsule study unrevealing -High risk of re-bleed per discussion with GI; will attempt to avoid DAPT and manage CAD medically at this time as toelrated -Likely challenge with heparin Monday pending continued clinical stability from GI standpoint -Continue to trend H/H  #Atrial Fibrillation: CHADs-vasc 4 -Holding eliquis givenGIB as above; challenge with heparin on Monday as above -Hold BBdue to symptomatic bradycardia as above  #HTN: -Continue amlodipine '10mg'$  daily -Continue hydralazine '25mg'$  TID--can up-titrate as tolerated -Increase imdur to '60mg'$  daily -Off BB due to bradycardia  #HLD: LDL 52. -Continueatorvastatin '80mg'$  daily and zetia '10mg'$   daily  #Carotid Stenosis: Mild on recent carotid ultrasound. -Continue statin and zetia as above  #ESRD: -Continue scheduled HD  #DMII: -On insulin  Discussed plan at length with assistance of the [name].  He will let us know if he has recurrent chest pain and we will try medical management at this time.    Total time of encounter: 45 minutes total time of encounter, including 30 minutes spent in face-to-face patient care on the date of this encounter. This time includes coordination of care and counseling regarding above mentioned problem list. Remainder of non-face-to-face time involved reviewing chart documents/testing relevant to the patient encounter and documentation in the medical record. I have independently reviewed documentation from referring provider.   Gwyndolyn Kaufman, MD [place]  For questions or updates, please contact [place] Please consult [email or web address] for contact info under        Signed, Freada Bergeron, MD  12/22/2020, 7:27 AM

## 2020-12-21 NOTE — Progress Notes (Signed)
Patient ID: Kenneth [name], male   DOB: [birth year], 65 y.o.   MRN: [id number]  PROGRESS NOTE    Kenneth [name]  [id number] DOB: 09-26-1955 DOA: 12/15/2020 PCP: [name], MD   Brief Narrative:  65 y.o.malewith medical history significant of HLD, HTN, DM1, A.fib, ESRD on HD, CAD sp CABG presented with CP tightness and weakness along with intermittent bloody stools.  On EMS arrival, patient was bradycardic into the 40s and poorly responsive for which she was given 1 mg of atropine and heart rate went up to 40 to 60s.  In the ED, potassium was 6.2, EKG showed junctional rhythm; he was given Lokelma, glucose and insulin.  Nephrology is consulted and he subsequently had HD.  He also was found to have hemoglobin drop of 2 units with Hemoccult positive stool.  Status post EGD on 12/23/2020 and colonoscopy on 12/20/2020.  Assessment & Plan:   Symptomatic bradycardia -Status post atropine on presentation.  Heart rate in the 60s to 70s currently.  Beta-blocker on hold. -EKG showed no acute ST changes -Echo in 3/22 showed EF of 55 to 60% with grade 2 diastolic dysfunction -Cardiology following  Possible non-STEMI versus demand ischemia in a patient with history of CAD status post CABG Hyperlipidemia -Troponin peaked up to more than 700.  EKG with no acute ST changes.  Cardiology following.  Continue statin and Zetia.  Imdur, losartan and metoprolol on hold.  Cardiology plans for cardiac cath once cleared by GI  GI bleeding Anemia of chronic kidney disease -Baseline hemoglobin around 9-10.  Hemoglobin dropped to 6.7.  Status post 1 unit packed red cell transfusion on 12/17/2020.  Hemoglobin 8.6 today -Continue PPI IV twice a day.  Eliquis on hold.   -Status post EGD on 12/19/2020 which showed erythematous mucosa in stomach which was biopsied and erythematous to adenopathy.   -Status post colonoscopy on 12/20/2020 which showed blood in the entire examined colon; 1 sigmoid colon polyp was  removed;Nonbleeding external and internal hemorrhoids.  GI subsequently planned for small bowel video capsule study.  Follow further GI recommendations.  End-stage renal disease on hemodialysis Hyperkalemia: Resolved -Nephrology following.  Dialysis as per nephrology schedule  Chronic diastolic heart failure -Volume management as per hemodialysis  Diabetes mellitus type 1 with hypoglycemia -A1c 5.9.  Continue insulin pump.  Diabetes coordinator following.  Continue CBG checks  Paroxysmal A. Fib -Currently rate controlled.  Metoprolol and Eliquis on hold  Hypertension -Blood pressure intermittently elevated.  continue amlodipine and hydralazine Generalized weakness--PT/OT eval   DVT prophylaxis: SCDs Code Status: Full Family Communication: None at bedside Disposition Plan: Status is: Inpatient  Remains inpatient appropriate because:Inpatient level of care appropriate due to severity of illness   Dispo: The patient is from: Home              Anticipated d/c is to: Home              Patient currently is not medically stable to d/c.   Difficult to place patient No  Consultants: Nephrology/GI/cardiology  Procedures:  EGD on 12/19/2020 Impression:               - Normal esophagus.                           - Erythematous mucosa in the stomach. Biopsied.                           -  Erythematous duodenopathy.                           - Duodenal deformity. Recommendation:           - Return patient to hospital ward for ongoing care.                           - The signs and symptoms of potential delayed                            complications were discussed with the patient.                           - Clear liquid diet today.                           - Continue present medications.                           - Continue to hold Eliquis.                           - Await pathology results.                           - Perform a colonoscopy tomorrow.  Colonoscopy on  12/20/2020 Impression:               - Blood in the entire examined colon. No obvious                            source of lower GI bleed after extensive lavage and                            suction                           - One less than 1 mm polyp in the sigmoid colon,                            removed with a cold biopsy forceps. Resected and                            retrieved.                           - Non-bleeding external and internal hemorrhoids. Recommendation:           - Patient has a contact number available for                            emergencies. The signs and symptoms of potential                            delayed complications were discussed with the  patient. Return to normal activities tomorrow.                            Written discharge instructions were provided to the                            patient.                           - Plan to proceed with small bowel video capsule                            study to exclude small bowel GI bleed/AVM                           - NPO for 2 hours. Followed by Clear liquid for 6                            hours, then resume previous diet                           - Continue present medications.                           - Await pathology results.                           - Repeat colonoscopy in 5-10 years, date to be                            determined after pending pathology results are                            reviewed for surveillance based on pathology                            results.  Antimicrobials: None   Subjective: Patient seen and examined at bedside.  Denies chest pain, worsening shortness breath.  Denies worsening abdominal pain, black or bloody stools.  No overnight fever reported Objective: Vitals:   12/20/20 2004 12/21/20 0033 12/21/20 0455 12/21/20 0641  BP:  (!) 108/49  (!) 145/45  Pulse:      Resp:      Temp: (!) 97.5 F (36.4 C) 98.7 F (37.1 C) 99 F  (37.2 C)   TempSrc: Oral Oral Oral   SpO2:      Weight:        Intake/Output Summary (Last 24 hours) at 12/21/2020 0749 Last data filed at 12/20/2020 1800 Gross per 24 hour  Intake 300 ml  Output 2000 ml  Net -1700 ml   Filed Weights   12/18/20 1200 12/20/20 1425 12/20/20 1800  Weight: 57.6 kg 61.2 kg 58.8 kg    Examination:  General exam: No acute distress.  On room air currently. respiratory system: Decreased breath sounds at bases bilaterally with some crackles cardiovascular system: Rate controlled, S1-S2 heard Gastrointestinal system: Abdomen is mildly distended, soft and nontender.  Bowel sounds are heard  extremities: Mild lower extremity edema  present; no clubbing Central nervous system: Sleepy, wakes up slightly, answers some questions.  No focal neurological deficits.  Moves extremities  skin: No obvious ecchymosis/rashes psychiatry: Flat affect   Data Reviewed: I have personally reviewed following labs and imaging studies  CBC: Recent Labs  Lab 12/17/20 0439 12/17/20 1143 12/18/20 0253 12/19/20 0149 12/20/20 0309 12/21/20 0228  WBC 3.8*  --  4.1 4.3 3.7* 3.8*  NEUTROABS 2.3  --  2.2 2.5 2.3 2.1  HGB 6.7* 8.2* 8.3* 8.9* 8.6* 8.6*  HCT 20.2* 24.6* 24.4* 26.6* 25.6* 25.1*  MCV 98.5  --  96.8 95.7 97.3 97.7  PLT 104*  --  98* 129* 122* [id number]   Basic Metabolic Panel: Recent Labs  Lab 12/17/20 0439 12/18/20 0253 12/19/20 0149 12/20/20 0309 12/21/20 0228  NA 135 136 133* 139 137  K 3.8 4.1 4.7 4.3 4.2  CL 97* 98 97* 103 98  CO2 32 '30 28 24 31  '$ GLUCOSE 218* 79 183* 87 200*  BUN 14 28* 25* 35* 15  CREATININE 3.67* 6.26* 5.08* 7.95* 5.32*  CALCIUM 7.9* 8.6* 8.2* 8.5* 7.9*  PHOS 2.9 5.6* 4.1 4.2 4.1   GFR: CrCl cannot be calculated (Unknown ideal weight.). Liver Function Tests: Recent Labs  Lab 12/15/20 1636 12/16/20 0857 12/17/20 0439 12/18/20 0253 12/19/20 0149 12/20/20 0309 12/21/20 0228  AST 18  --   --   --   --   --   --   ALT 17  --   --    --   --   --   --   ALKPHOS 82  --   --   --   --   --   --   BILITOT 0.6  --   --   --   --   --   --   PROT 5.7*  --   --   --   --   --   --   ALBUMIN 3.3*   < > 3.1* 3.2* 3.5 3.4* 3.3*   < > = values in this interval not displayed.   No results for input(s): LIPASE, AMYLASE in the last 168 hours. No results for input(s): AMMONIA in the last 168 hours. Coagulation Profile: Recent Labs  Lab 12/15/20 1636  INR 1.3*   Cardiac Enzymes: No results for input(s): CKTOTAL, CKMB, CKMBINDEX, TROPONINI in the last 168 hours. BNP (last 3 results) No results for input(s): PROBNP in the last 8760 hours. HbA1C: No results for input(s): HGBA1C in the last 72 hours. CBG: Recent Labs  Lab 12/20/20 1147 12/20/20 1838 12/20/20 2006 12/21/20 0032 12/21/20 0454  GLUCAP 141* 117* 350* 340* 176*   Lipid Profile: No results for input(s): CHOL, HDL, LDLCALC, TRIG, CHOLHDL, LDLDIRECT in the last 72 hours. Thyroid Function Tests: No results for input(s): TSH, T4TOTAL, FREET4, T3FREE, THYROIDAB in the last 72 hours. Anemia Panel: No results for input(s): VITAMINB12, FOLATE, FERRITIN, TIBC, IRON, RETICCTPCT in the last 72 hours. Sepsis Labs: No results for input(s): PROCALCITON, LATICACIDVEN in the last 168 hours.  Recent Results (from the past 240 hour(s))  Resp Panel by RT-PCR (Flu A&B, Covid) Nasopharyngeal Swab     Status: None   Collection Time: 12/15/20  8:57 PM   Specimen: Nasopharyngeal Swab; Nasopharyngeal(NP) swabs in vial transport medium  Result Value Ref Range Status   SARS Coronavirus 2 by RT PCR NEGATIVE NEGATIVE Final    Comment: (NOTE) SARS-CoV-2 target nucleic acids are NOT DETECTED.  The SARS-CoV-2 RNA is generally  detectable in upper respiratory specimens during the acute phase of infection. The lowest concentration of SARS-CoV-2 viral copies this assay can detect is 138 copies/mL. A negative result does not preclude SARS-Cov-2 infection and should not be used as the  sole basis for treatment or other patient management decisions. A negative result may occur with  improper specimen collection/handling, submission of specimen other than nasopharyngeal swab, presence of viral mutation(s) within the areas targeted by this assay, and inadequate number of viral copies(<138 copies/mL). A negative result must be combined with clinical observations, patient history, and epidemiological information. The expected result is Negative.  Fact Sheet for Patients:  EntrepreneurPulse.com.au  Fact Sheet for Healthcare Providers:  IncredibleEmployment.be  This test is no t yet approved or cleared by the [place] FDA and  has been authorized for detection and/or diagnosis of SARS-CoV-2 by FDA under an Emergency Use Authorization (EUA). This EUA will remain  in effect (meaning this test can be used) for the duration of the COVID-19 declaration under Section 564(b)(1) of the Act, 21 U.S.C.section 360bbb-3(b)(1), unless the authorization is terminated  or revoked sooner.       Influenza A by PCR NEGATIVE NEGATIVE Final   Influenza B by PCR NEGATIVE NEGATIVE Final    Comment: (NOTE) The Xpert Xpress SARS-CoV-2/FLU/RSV plus assay is intended as an aid in the diagnosis of influenza from Nasopharyngeal swab specimens and should not be used as a sole basis for treatment. Nasal washings and aspirates are unacceptable for Xpert Xpress SARS-CoV-2/FLU/RSV testing.  Fact Sheet for Patients: EntrepreneurPulse.com.au  Fact Sheet for Healthcare Providers: IncredibleEmployment.be  This test is not yet approved or cleared by the [place] FDA and has been authorized for detection and/or diagnosis of SARS-CoV-2 by FDA under an Emergency Use Authorization (EUA). This EUA will remain in effect (meaning this test can be used) for the duration of the COVID-19 declaration under Section 564(b)(1) of the  Act, 21 U.S.C. section 360bbb-3(b)(1), unless the authorization is terminated or revoked.  Performed at [facility], [place] [address], [place], [place] [address]          Radiology Studies: No results found.      Scheduled Meds: . amLODipine  10 mg Oral Daily  . atorvastatin  80 mg Oral Daily  . calcium acetate  667 mg Oral TID WC  . Chlorhexidine Gluconate Cloth  6 each Topical Q0600  . cinacalcet  60 mg Oral Q M,W,F  . [START ON 12/25/2020] darbepoetin (ARANESP) injection - DIALYSIS  60 mcg Intravenous Q Wed-HD  . ezetimibe  10 mg Oral Daily  . hydrALAZINE  25 mg Oral Q8H  . insulin pump   Subcutaneous Q4H  . pantoprazole (PROTONIX) IV  40 mg Intravenous Q12H   Continuous Infusions: . sodium chloride    . nitroGLYCERIN Stopped (12/19/20 [id number])          Aline August, MD [facility] 12/21/2020, 7:49 AM

## 2020-12-21 NOTE — Progress Notes (Signed)
[facility] Progress Note  Subjective: no c/o today  Vitals:   12/21/20 0455 12/21/20 0641 12/21/20 0818 12/21/20 1200  BP:  (!) 145/45 (!) 133/51 (!) 142/73  Pulse:   79 72  Resp:   16 17  Temp: 99 F (37.2 C)  98.7 F (37.1 C) 98.3 F (36.8 C)  TempSrc: Oral  Oral Oral  SpO2:   97% 97%  Weight:        Exam:   alert, nad   no jvd  Chest cta bilat  Cor reg no RG  Abd soft ntnd no ascites   Ext no LE edema   Alert, NF, ox3    AVF+bruit    OP HD: MWF AF  3h 47mn  400/500   59.5kg  2/2 bath  AVF     Assessment/ Plan: 1. Symptomatic bradycardia: metoprolol on hold. HR 60's.  2. CAD/ hx atrial fib - eliquis on hold for anemia. NSR now.  3. Anemia - +FOB, Hb 8.3. EGD negative and colonscopy showed diffuse old blood but no active bleeding. Plan is for VCE.  4. ESRD: usual HD MWF. Next HD Monday. 5. Volume/ hypertension: BP's stable on norvasc/ hydralazine. Under dry wt today, 2 L off on HD yest w/ lowest BP 118. Can lower edw further w/ next HD (57-58kg).  6. Anemia ckd: sp prbc'sx 1 here for Hb 6.8. Hb 8- 9 now.  Currently receiving mircera 566m q 4wks. Here is getting darbe IV 6055mweekly on Tuesday (given 4/5). Hold weekly iron given transfusion prn.  7. MBD ckd: Sensipar 57m13m/ dialysis. Phos 3- 5, resumed phoslo 1 ac tid.   8. Vascular access: AVF issues being evaluated outpt per patient   Rob Kelly Splinter/2022, 12:49 PM   Recent Labs  Lab 12/20/20 0309 12/21/20 0228  K 4.3 4.2  BUN 35* 15  CREATININE 7.95* 5.32*  CALCIUM 8.5* 7.9*  PHOS 4.2 4.1  HGB 8.6* 8.6*   Inpatient medications: . amLODipine  10 mg Oral Daily  . atorvastatin  80 mg Oral Daily  . calcium acetate  667 mg Oral TID WC  . Chlorhexidine Gluconate Cloth  6 each Topical Q0600  . cinacalcet  60 mg Oral Q M,W,F  . [START ON 12/25/2020] darbepoetin (ARANESP) injection - DIALYSIS  60 mcg Intravenous Q Wed-HD  . ezetimibe  10 mg Oral Daily  . hydrALAZINE  25 mg Oral Q8H  .  insulin pump   Subcutaneous Q4H  . pantoprazole (PROTONIX) IV  40 mg Intravenous Q12H   . sodium chloride    . nitroGLYCERIN Stopped (12/19/20 0727)   acetaminophen

## 2020-12-21 NOTE — Plan of Care (Signed)
  Problem: Education: Goal: Knowledge of General Education information will improve Description Including pain rating scale, medication(s)/side effects and non-pharmacologic comfort measures Outcome: Progressing   

## 2020-12-21 NOTE — Progress Notes (Signed)
[facility] Progress Note  Chief Complaint: Hematochezia and acute blood loss anemia  History:  I received signout from my partners and performed extensive chart review.  I last saw this patient 4 days ago, and since then he had ongoing GI bleeding and underwent endoscopic procedures  I reviewed the upper endoscopy and colonoscopy reports, and this afternoon I read his small bowel video capsule study.  A full report of that will be scanned into the chart but is summarized below.  He has no overt GI bleeding in the way of melena or bright red blood per rectum since the colonoscopy.  He denies chest pain at present, and is off his nitroglycerin drip.  He denies dyspnea or abdominal pain.  He is eating a regular diet without difficulty.  I spoke with him through the [place] video interpreter, and I had a lengthy discussion with Dr. Johney Frame of cardiology about this patient as well today.   Objective:   Current Facility-Administered Medications:  .  0.9 %  sodium chloride infusion, 10 mL/hr, Intravenous, Once, Darden Amber, [place], MD .  acetaminophen (TYLENOL) tablet 650 mg, 650 mg, Oral, Q6H PRN, Starla Link, Kshitiz, MD, 650 mg at 12/17/20 1117 .  amLODipine (NORVASC) tablet 10 mg, 10 mg, Oral, Daily, Alekh, Kshitiz, MD, 10 mg at 12/21/20 0933 .  atorvastatin (LIPITOR) tablet 80 mg, 80 mg, Oral, Daily, Alekh, Kshitiz, MD, 80 mg at 12/21/20 0933 .  calcium acetate (PHOSLO) capsule 667 mg, 667 mg, Oral, TID WC, Roney Jaffe, MD, 667 mg at 12/21/20 1259 .  Chlorhexidine Gluconate Cloth 2 % PADS 6 each, 6 each, Topical, Q0600, Reesa Chew, MD, 6 each at 12/21/20 [phone] .  cinacalcet (SENSIPAR) tablet 60 mg, 60 mg, Oral, Q M,W,F, Reesa Chew, MD, 60 mg at 12/21/20 0933 .  [START ON 12/25/2020] Darbepoetin Alfa (ARANESP) injection 60 mcg, 60 mcg, Intravenous, Q Wed-HD, Starla Link, Kshitiz, MD, 60 mcg at 12/18/20 1001 .  ezetimibe (ZETIA) tablet 10 mg, 10 mg, Oral, Daily, Alekh, Kshitiz, MD, 10 mg at  12/21/20 0932 .  hydrALAZINE (APRESOLINE) tablet 25 mg, 25 mg, Oral, Q8H, Alekh, Kshitiz, MD, 25 mg at 12/21/20 0641 .  insulin pump, , Subcutaneous, Q4H, Aline August, MD, Given at 12/21/20 1259 .  isosorbide mononitrate (IMDUR) 24 hr tablet 30 mg, 30 mg, Oral, Daily, Pemberton, Heather E, MD .  pantoprazole (PROTONIX) injection 40 mg, 40 mg, Intravenous, Q12H, Doutova, Anastassia, MD, 40 mg at 12/21/20 0932 .  ranolazine (RANEXA) 12 hr tablet 500 mg, 500 mg, Oral, BID, Pemberton, Heather E, MD  . sodium chloride       Vital signs in last 24 hrs: Vitals:   12/21/20 0818 12/21/20 1200  BP: (!) 133/51 (!) 142/73  Pulse: 79 72  Resp: 16 17  Temp: 98.7 F (37.1 C) 98.3 F (36.8 C)  SpO2: 97% 97%    Intake/Output Summary (Last 24 hours) at 12/21/2020 1526 Last data filed at 12/20/2020 1800 Gross per 24 hour  Intake --  Output 2000 ml  Net -2000 ml     Physical Exam Sitting up comfortably in bed, moves to bedside without difficulty.  He is in no acute distress at present.  HEENT: sclera anicteric, oral mucosa without lesions  Neck: supple, no thyromegaly, JVD or lymphadenopathy  Cardiac: RRR without murmurs, S1S2 heard, no peripheral edema.  Left arm AV fistula  Pulm: clear to auscultation bilaterally, normal RR and effort noted  Abdomen: soft, no tenderness, with active bowel sounds. No guarding  or palpable hepatosplenomegaly  Skin; warm and dry, no jaundice.  Pale  Recent Labs:  CBC Latest Ref Rng & Units 12/21/2020 12/20/2020 12/19/2020  WBC 4.0 - 10.5 K/uL 3.8(L) 3.7(L) 4.3  Hemoglobin 13.0 - 17.0 g/dL 8.6(L) 8.6(L) 8.9(L)  Hematocrit 39.0 - 52.0 % 25.1(L) 25.6(L) 26.6(L)  Platelets 150 - 400 K/uL 151 122(L) 129(L)    Recent Labs  Lab 12/15/20 1636  INR 1.3*   CMP Latest Ref Rng & Units 12/21/2020 12/20/2020 12/19/2020  Glucose 70 - 99 mg/dL 200(H) 87 183(H)  BUN 8 - 23 mg/dL 15 35(H) 25(H)  Creatinine 0.61 - 1.24 mg/dL 5.32(H) 7.95(H) 5.08(H)  Sodium 135 - 145 mmol/L  137 139 133(L)  Potassium 3.5 - 5.1 mmol/L 4.2 4.3 4.7  Chloride 98 - 111 mmol/L 98 103 97(L)  CO2 22 - 32 mmol/L '31 24 28  '$ Calcium 8.9 - 10.3 mg/dL 7.9(L) 8.5(L) 8.2(L)  Total Protein 6.5 - 8.1 g/dL - - -  Total Bilirubin 0.3 - 1.2 mg/dL - - -  Alkaline Phos 38 - 126 U/L - - -  AST 15 - 41 U/L - - -  ALT 0 - 44 U/L - - -   Small bowel video capsule study shows the capsule passing quickly into the duodenum.  There is fair visualization of the small bowel since there is retained fluid and debris.  Overall that is typical for video capsule study.  Relatively fast transit time about 2 hours and 25 minutes into the colon, at which time no fresh or old blood was seen in the right colon.  Little or no mucosal visualization of the colon is obtained on this study.  There was no fresh or old blood throughout the small bowel.  No mucosal abnormality such as AVM tumor or inflammation were visualized in the small bowel.   Assessment & Plan  Assessment:  Hematochezia Acute blood loss anemia Anemia of chronic kidney disease Acute coronary syndrome with demand MI precipitated by significant GI bleeding that has now temporarily stopped off anticoagulation.  Patient was on Eliquis for atrial fibrillation. Chronic kidney disease Long-term use anticoagulation Demand ischemia/MI  Again, had a lengthy discussion with this patient and Dr. Johney Frame of cardiology because we are in a difficult spot here.  The tentative plan was a cardiac catheterization when it was felt that his GI bleeding was stable.  Naturally, that study would be to look for any significant fixed coronary lesions amenable to and requiring intervention.  To do that would almost certainly require dual antiplatelet therapy on top of eventual reintroduction of his [place] for underlying A. Fib.  However, no discrete GI bleeding source has been localized despite extensive efforts.  After the colonoscopy, the suspicion seems to been a small bowel  source, but with no fresh or old blood seen on intubation of the terminal ileum at the time of colonoscopy, and would the type of blood that I see in the photographs from the colonoscopy, I am suspicious this was an obscure colonic source such as a small AVM.  Despite extensive lavage of the colon and efforts at mucosal visualization by Dr. Silverio Decamp, no such lesion was seen, but they are often extremely difficult to find and can even be evanescent due to changes in blood pressure and intestinal microcirculation.  Dr. Johney Frame plans to review the patient's last cardiac catheterization images with her interventional colleagues in the context of this acute hospitalization and determine whether the risk-benefit ratio favors cardiac catheterization at this  time.  In a similar fashion, risk-benefit analysis of resuming anticoagulation must also be undertaken and discussed in depth with the patient.  Even if cardiac catheterization is not pursued and if anticoagulation is to be resumed, it would be best to do so in hospital with IV heparin to see if recurrent bleeding occurs.  If it does, difficult decision will need to be made regarding imaging such as CT angiogram the potential percutaneous intervention if active source found versus repeat colonoscopy.  However, colonoscopy in the setting of ongoing bleeding is even less likely to be able to identify the source then was the recent colonoscopy done with a good bowel preparation with no active bleeding.  I explained all this to the patient best I could through the 3 and video phone interpreter, he understood and his questions were answered.  We will follow closely with you.  I spent a total of 45 minutes with the patient reviewing hospital notes, imaging reports, pathology (if applicable),  labs and examining the patient as well as establishing an assessment and plan that was discussed with the patient.  > 50% of time was spent in direct patient care.    Nelida Meuse III Office: [phone]

## 2020-12-22 ENCOUNTER — Inpatient Hospital Stay (HOSPITAL_COMMUNITY): Payer: Medicare Other

## 2020-12-22 DIAGNOSIS — R001 Bradycardia, unspecified: Secondary | ICD-10-CM | POA: Diagnosis not present

## 2020-12-22 DIAGNOSIS — R079 Chest pain, unspecified: Secondary | ICD-10-CM | POA: Diagnosis not present

## 2020-12-22 DIAGNOSIS — I5032 Chronic diastolic (congestive) heart failure: Secondary | ICD-10-CM | POA: Diagnosis not present

## 2020-12-22 DIAGNOSIS — G9341 Metabolic encephalopathy: Secondary | ICD-10-CM

## 2020-12-22 DIAGNOSIS — I25118 Atherosclerotic heart disease of native coronary artery with other forms of angina pectoris: Secondary | ICD-10-CM | POA: Diagnosis not present

## 2020-12-22 DIAGNOSIS — K921 Melena: Secondary | ICD-10-CM | POA: Diagnosis not present

## 2020-12-22 DIAGNOSIS — I480 Paroxysmal atrial fibrillation: Secondary | ICD-10-CM | POA: Diagnosis not present

## 2020-12-22 DIAGNOSIS — N186 End stage renal disease: Secondary | ICD-10-CM | POA: Diagnosis not present

## 2020-12-22 DIAGNOSIS — D62 Acute posthemorrhagic anemia: Secondary | ICD-10-CM | POA: Diagnosis not present

## 2020-12-22 LAB — RENAL FUNCTION PANEL
Albumin: 3.3 g/dL — ABNORMAL LOW (ref 3.5–5.0)
Anion gap: 8 (ref 5–15)
BUN: 31 mg/dL — ABNORMAL HIGH (ref 8–23)
CO2: 27 mmol/L (ref 22–32)
Calcium: 7.8 mg/dL — ABNORMAL LOW (ref 8.9–10.3)
Chloride: 99 mmol/L (ref 98–111)
Creatinine, Ser: 7.74 mg/dL — ABNORMAL HIGH (ref 0.61–1.24)
GFR, Estimated: 7 mL/min — ABNORMAL LOW (ref >60)
Glucose, Bld: 143 mg/dL — ABNORMAL HIGH (ref 70–99)
Phosphorus: 4.1 mg/dL (ref 2.5–4.6)
Potassium: 4 mmol/L (ref 3.5–5.1)
Sodium: 134 mmol/L — ABNORMAL LOW (ref 135–145)

## 2020-12-22 LAB — GLUCOSE, CAPILLARY
Glucose-Capillary: 132 mg/dL — ABNORMAL HIGH (ref 70–99)
Glucose-Capillary: 136 mg/dL — ABNORMAL HIGH (ref 70–99)
Glucose-Capillary: 171 mg/dL — ABNORMAL HIGH (ref 70–99)
Glucose-Capillary: 191 mg/dL — ABNORMAL HIGH (ref 70–99)
Glucose-Capillary: 71 mg/dL (ref 70–99)
Glucose-Capillary: 82 mg/dL (ref 70–99)
Glucose-Capillary: 85 mg/dL (ref 70–99)
Glucose-Capillary: 90 mg/dL (ref 70–99)

## 2020-12-22 LAB — CBC WITH DIFFERENTIAL/PLATELET
Abs Immature Granulocytes: 0.01 10*3/uL (ref 0.00–0.07)
Basophils Absolute: 0 10*3/uL (ref 0.0–0.1)
Basophils Relative: 0 %
Eosinophils Absolute: 0.3 10*3/uL (ref 0.0–0.5)
Eosinophils Relative: 6 %
HCT: 23.9 % — ABNORMAL LOW (ref 39.0–52.0)
Hemoglobin: 7.9 g/dL — ABNORMAL LOW (ref 13.0–17.0)
Immature Granulocytes: 0 %
Lymphocytes Relative: 27 %
Lymphs Abs: 1.2 10*3/uL (ref 0.7–4.0)
MCH: 32.9 pg (ref 26.0–34.0)
MCHC: 33.1 g/dL (ref 30.0–36.0)
MCV: 99.6 fL (ref 80.0–100.0)
Monocytes Absolute: 0.6 10*3/uL (ref 0.1–1.0)
Monocytes Relative: 13 %
Neutro Abs: 2.4 10*3/uL (ref 1.7–7.7)
Neutrophils Relative %: 54 %
Platelets: 150 10*3/uL (ref 150–400)
RBC: 2.4 MIL/uL — ABNORMAL LOW (ref 4.22–5.81)
RDW: 14.5 % (ref 11.5–15.5)
WBC: 4.6 10*3/uL (ref 4.0–10.5)
nRBC: 0 % (ref 0.0–0.2)

## 2020-12-22 LAB — BLOOD GAS, ARTERIAL
Acid-Base Excess: 1.5 mmol/L (ref 0.0–2.0)
Bicarbonate: 25.7 mmol/L (ref 20.0–28.0)
Drawn by: 548791
FIO2: 21
O2 Saturation: 96 %
Patient temperature: 37
pCO2 arterial: 41.4 mmHg (ref 32.0–48.0)
pH, Arterial: 7.41 (ref 7.350–7.450)
pO2, Arterial: 88.4 mmHg (ref 83.0–108.0)

## 2020-12-22 MED ORDER — RANOLAZINE ER 500 MG PO TB12
500.0000 mg | ORAL_TABLET | Freq: Two times a day (BID) | ORAL | Status: DC
  Administered 2020-12-22 – 2020-12-25 (×6): 500 mg via ORAL
  Filled 2020-12-22 (×7): qty 1

## 2020-12-22 MED ORDER — ISOSORBIDE MONONITRATE ER 60 MG PO TB24
60.0000 mg | ORAL_TABLET | Freq: Every day | ORAL | Status: DC
  Administered 2020-12-22: 60 mg via ORAL
  Filled 2020-12-22: qty 1

## 2020-12-22 MED ORDER — INSULIN ASPART 100 UNIT/ML ~~LOC~~ SOLN
0.0000 [IU] | SUBCUTANEOUS | Status: DC
  Administered 2020-12-22: 1 [IU] via SUBCUTANEOUS
  Administered 2020-12-23: 2 [IU] via SUBCUTANEOUS
  Administered 2020-12-23: 1 [IU] via SUBCUTANEOUS
  Administered 2020-12-23: 2 [IU] via SUBCUTANEOUS
  Administered 2020-12-23: 1 [IU] via SUBCUTANEOUS
  Administered 2020-12-24: 3 [IU] via SUBCUTANEOUS
  Administered 2020-12-24: 5 [IU] via SUBCUTANEOUS
  Administered 2020-12-24 (×2): 3 [IU] via SUBCUTANEOUS
  Administered 2020-12-24: 2 [IU] via SUBCUTANEOUS
  Administered 2020-12-25: 3 [IU] via SUBCUTANEOUS
  Administered 2020-12-25: 2 [IU] via SUBCUTANEOUS

## 2020-12-22 MED ORDER — SODIUM CHLORIDE 0.9 % IV BOLUS: 500.0000 mL | Freq: Once | INTRAVENOUS | Status: DC | PRN

## 2020-12-22 MED ORDER — RANOLAZINE ER 500 MG PO TB12
1000.0000 mg | ORAL_TABLET | Freq: Two times a day (BID) | ORAL | Status: DC
  Administered 2020-12-22: 1000 mg via ORAL
  Filled 2020-12-22: qty 2

## 2020-12-22 NOTE — Progress Notes (Addendum)
[name] Progress Note  CC:  Anemia, GI bleed  Subjective:  Stratus video interpretor utilized to facilitate communication. He feels well today. No CP or SOB. Off NTG gtt. No N/V. He passed loose stool yesterday with a small amount of pink blood. No BM or hematochezia today. His wife is at the bedside.   Objective:   EGD 12/19/2020: - Normal esophagus. - Erythematous mucosa in the stomach. Biopsied. - Erythematous duodenopathy. - Moderate post-ulcer deformity was found in the duodenal bulb. Biopsy report: . STOMACH, BIOPSY:  - Gastric antral mucosa with mild nonspecific reactive gastropathy  - Gastric oxyntic mucosa with parietal cell hyperplasia as can be seen  in hypergastrinemic states such as PPI therapy.  - Focal intestinal metaplasia of oxyntic mucosa, negative for dysplasia  - Warthin Starry stain is negative for Helicobacter pylori   Colonoscopy 12/20/2020: - Blood in the entire examined colon. No obvious source of lower GI bleed after extensive lavage and suction - One less than 1 mm polyp in the sigmoid colon, removed with a cold biopsy forceps. Resected and retrieved. - Non-bleeding external and internal hemorrhoids.  Small bowel capsule endoscopy: Small bowel video capsule study shows the capsule passing quickly into the duodenum.  There is fair visualization of the small bowel since there is retained fluid and debris.  Overall that is typical for video capsule study.  Relatively fast transit time about 2 hours and 25 minutes into the colon, at which time no fresh or old blood was seen in the right colon.  Little or no mucosal visualization of the colon is obtained on this study.  There was no fresh or old blood throughout the small bowel.  No mucosal abnormality such as AVM tumor or inflammation were visualized in the small bowel.  Vital signs in last 24 hours: Temp:  [98 F (36.7 C)-99.1 F (37.3 C)] 98 F (36.7 C) (04/10 1151) Pulse Rate:   [63-68] 65 (04/10 1151) Resp:  [12-20] 16 (04/10 1151) BP: (127-167)/(34-55) 157/44 (04/10 1151) SpO2:  [99 %-100 %] 100 % (04/10 0432) Last BM Date: 12/21/20   General:   Alert in NAD.  Heart: Bradycardic. Murmur.  Pulm:  Breath sound clear throughout.  Abdomen: Soft, nondistended. Nontender. + BS x 4 quads.  Extremities:  Without edema. Neurologic:  Alert and  oriented x 4;  grossly normal neurologically. Psych:  Alert and cooperative. Normal mood and affect.  Intake/Output from previous day: No intake/output data recorded. Intake/Output this shift: No intake/output data recorded.  Lab Results: Recent Labs    12/20/20 0309 12/21/20 0228 12/22/20 0111  WBC 3.7* 3.8* 4.6  HGB 8.6* 8.6* 7.9*  HCT 25.6* 25.1* 23.9*  PLT 122* 151 150   BMET Recent Labs    12/20/20 0309 12/21/20 0228 12/22/20 0111  NA 139 137 134*  K 4.3 4.2 4.0  CL 103 98 99  CO2 '24 31 27  '$ GLUCOSE 87 200* 143*  BUN 35* 15 31*  CREATININE 7.95* 5.32* 7.74*  CALCIUM 8.5* 7.9* 7.8*   LFT Recent Labs    12/22/20 0111  ALBUMIN 3.3*   PT/INR No results for input(s): LABPROT, INR in the last 72 hours. Hepatitis Panel No results for input(s): HEPBSAG, HCVAB, HEPAIGM, HEPBIGM in the last 72 hours.  No results found.  Assessment / Plan:  1. Acute on chronic anemia with hematochezia. S/P EGD 4/7 showed a normal esophagus and stomach, erythematous duodenopathy and a moderate post-ulcer deformity was found in the duodenal  bulb. Gastric biopsies showed focal intestinal metaplasia without dysplasia and no evidence of H. Pylori. Colonoscopy 4/8 identified blood in the entire colon without obvious source of lower GI bleed.  A < 1 mm polyp in the sigmoid colon was removed and nonbleeding internal and external hemorrhoids.  Small bowel capsule endoscopy 4/8 without evidence of fresh or old blood throughout the small bowel. GI bleed thought to be due to possible AVMs. Hg 8.6 -> 7.9 today.  -Repeat H/H in  am -Transfuse for Hg < 8 -Continue Pantoprazole 40 mg IV twice daily -Diet as tolerated  -Further recommendations per Dr. Loletha Carrow   2. Acute coronary syndrome/NSTEMI and bradycardia. Chest pain currently resolved, he remains off NTG. Cardiology planning on medical management of CAD as he is at significant risk of re-bleeding if DAPT initiated. To consider cardiac cath if medical management fails. Per Dr. Loletha Carrow,  if cardiac catheterization is not pursued and if anticoagulation is to be resumed, it would be best to do so in hospital with IV heparin to see if recurrent bleeding occurs    3. Atrial fibrillation. Eliquis on hold.     Active Problems:   ESRD (end stage renal disease) (HCC)   CAD (coronary artery disease)   Chest pain   Gastroesophageal reflux disease   Chronic diastolic CHF (congestive heart failure) (HCC)   Hyperkalemia   Hyperlipidemia   AF (paroxysmal atrial fibrillation) (HCC)   Symptomatic bradycardia   Postural dizziness with presyncope   Symptomatic anemia   Blood in stool   Occult blood in stools     LOS: 7 days   Kenneth [name]  12/22/2020, 12:44 PM  I have discussed the case with the APP, and that is the plan I formulated. I personally interviewed and examined the patient.  CC: Hematochezia  Currently no overt bleeding.  Extensive impression and plan in yesterday's note after consultation with cardiology. It sounds like cardiology may decide to forego cardiac catheterization.  However, anticoagulation needed for underlying A. fib, and so he is likely to begin unfractionated heparin tomorrow to see if recurrent GI bleeding occurs.    No source of upper GI bleeding, so Protonix was discontinued.  I will sign out the patient to Dr. Hilarie Fredrickson, who will assume the consult service tomorrow.    Nelida Meuse III Office: [phone]

## 2020-12-22 NOTE — Progress Notes (Signed)
Pill capsule recording complete.

## 2020-12-22 NOTE — Progress Notes (Signed)
[facility] PROGRESS NOTE  Patient: Kenneth [name] [id number]   PCP: [name], MD DOB: Jan 22, 1956   DOA: 12/15/2020   DOS: 12/22/2020    Subjective: I was asked to evaluate the patient at the bedside due to worsening mental status changes.  Reportedly patient had tachypnea followed by bradypnea and apnea.  And was confused after that. At the time of my evaluation patient was flat in affect.  But was able to verbalize his name, tell me where he is, why he is in the hospital.  Interpreter was used for this conversation.  Also denied any acute complaint.  No dizziness no chest pain.   Objective:  Vitals:   12/22/20 1426 12/22/20 1710  BP: (!) 89/15   Pulse:    Resp:    Temp:    SpO2:  96%    S1-S2 present. Clear to auscultation. Bowel sounds present. Mental status AAOx3, speech normal, attention easily lost,  Cranial Nerves PERRL, EOM normal and present, no nystagmus. Motor strength bilateral equal strength 5/5,  Sensation present to light touch,  No asterixis.  CBG 85.  ABG normal.  Vitals heart rate in 60s.  Sinus.  Blood pressure 130/44.  EKG with T wave inversions unchanged from prior. Other ST-T wave changes not consistent with ischemia.  Assessment and plan: Acute encephalopathy. Etiology not clear.  Unsure whether this is metabolic in nature or not. At present there is no evidence of acute infection. Could be associated with drop in the blood pressure leading to confusion. Patient is receiving IV fluid.  We will only limit to 500 cc bolus due to ESRD status. Recommending to discontinue insulin pump given confusion and monitor CBG every 4 hours with sensitive sliding scale. We will also provide grape juice and see if mentation improves. To complete work-up will get CT head due to acute change in the mental condition. Orthostatic vitals after procedure. Discontinue blood pressure medication. Monitor on telemetry. Discussed with primary  provider.  Author: Berle Mull, MD [facility] 12/22/2020 5:42 PM   If 7PM-7AM, please contact night-coverage at [email or web address]

## 2020-12-22 NOTE — Progress Notes (Signed)
[facility] Progress Note  Subjective: no c/o today, in good spirits.   Vitals:   12/22/20 0016 12/22/20 0042 12/22/20 0432 12/22/20 0745  BP: (!) 133/34 (!) 140/52 (!) 167/49 (!) 161/55  Pulse: 63  64 67  Resp: '12  17 18  '$ Temp: 98.6 F (37 C)  98.2 F (36.8 C) 98.2 F (36.8 C)  TempSrc: Oral  Oral Oral  SpO2: 99%  100%   Weight:        Exam:   alert, nad   no jvd  Chest cta bilat  Cor reg no RG  Abd soft ntnd no ascites   Ext no LE edema   Alert, NF, ox3    AVF+bruit    OP HD: MWF AF  3h 25mn  400/500   59.5kg  2/2 bath  AVF     Assessment/ Plan: 1. Symptomatic bradycardia: metoprolol on hold. HR 60's.  2. CAD/ hx atrial fib - eliquis on hold for anemia. NSR now.  3. Anemia - +FOB, Hb 8.3. EGD negative and colonscopy showed diffuse old blood but no active bleeding. Plan is for VCE.  4. ESRD: usual HD MWF. Next HD Monday. 5. Volume/ hypertension: BP's stable on norvasc/ hydralazine. Under dry wt. Try to lower edw a bit w/ next HD.  6. Anemia ckd: sp prbc'sx 1 here for Hb 6.8. Hb 8- 9 now.  Currently receiving mircera 547m q 4wks. Here is getting darbe IV 6053mweekly on Tuesday (given 4/5). Hold weekly iron given transfusion prn.  7. 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 Phos 3- 5, resumed phoslo 1 ac tid.   8. Vascular access: AVF issues being evaluated outpt per patient   Rob Kelly Splinter0/2022, 10:51 AM   Recent Labs  Lab 12/21/20 0228 12/22/20 0111  K 4.2 4.0  BUN 15 31*  CREATININE 5.32* 7.74*  CALCIUM 7.9* 7.8*  PHOS 4.1 4.1  HGB 8.6* 7.9*   Inpatient medications: . amLODipine  10 mg Oral Daily  . atorvastatin  80 mg Oral Daily  . calcium acetate  667 mg Oral TID WC  . Chlorhexidine Gluconate Cloth  6 each Topical Q0600  . cinacalcet  60 mg Oral Q M,W,F  . [START ON 12/25/2020] darbepoetin (ARANESP) injection - DIALYSIS  60 mcg Intravenous Q Wed-HD  . ezetimibe  10 mg Oral Daily  . hydrALAZINE  25 mg Oral Q8H  . insulin pump    Subcutaneous Q4H  . isosorbide mononitrate  60 mg Oral Daily  . pantoprazole (PROTONIX) IV  40 mg Intravenous Q12H  . ranolazine  500 mg Oral BID   . sodium chloride     acetaminophen

## 2020-12-22 NOTE — Plan of Care (Signed)
  Problem: Education: Goal: Knowledge of General Education information will improve Description Including pain rating scale, medication(s)/side effects and non-pharmacologic comfort measures Outcome: Progressing   

## 2020-12-22 NOTE — Progress Notes (Signed)
Pt seen with RR RN for shortness of breath and dizziness. Pt with periods of tachypnea, bradypnea and apneas. Pt responsive at times while RT in room but does not speak english. RT obtained ABG x1 attempt. Jody in lab notified at 1715 of sample being sent. No further needs at this time by RT. Pt remains on room air, SpO2 96%, no distress noted at this time. RR RN to call if new needs arise. RT will continue to monitor and be available as needed.

## 2020-12-22 NOTE — Progress Notes (Signed)
Patient ID: DAMAINE EYRING, male   DOB: 06-03-1956, 65 y.o.   MRN: [id number]  PROGRESS NOTE    ZOHAIB CRAFT  [id number] DOB: 11-08-1955 DOA: 12/15/2020 PCP: [name], MD   Brief Narrative:  65 y.o.malewith medical history significant of HLD, HTN, DM1, A.fib, ESRD on HD, CAD sp CABG presented with CP tightness and weakness along with intermittent bloody stools.  On EMS arrival, patient was bradycardic into the 40s and poorly responsive for which she was given 1 mg of atropine and heart rate went up to 40 to 60s.  In the ED, potassium was 6.2, EKG showed junctional rhythm; he was given Lokelma, glucose and insulin.  Nephrology is consulted and he subsequently had HD.  He also was found to have hemoglobin drop of 2 units with Hemoccult positive stool.  Status post EGD on 12/23/2020 and colonoscopy on 12/20/2020.  Assessment & Plan:   Symptomatic bradycardia -Status post atropine on presentation.  Heart rate in the 60s to 70s currently.  Beta-blocker on hold. -EKG showed no acute ST changes -Echo in 3/22 showed EF of 55 to 60% with grade 2 diastolic dysfunction -Cardiology following  Possible non-STEMI versus demand ischemia in a patient with history of CAD status post CABG Hyperlipidemia -Troponin peaked up to more than 700.  EKG with no acute ST changes.  Cardiology following.  Continue statin and Zetia.  Imdur, losartan and metoprolol on hold.  Cardiology plans for cardiac cath once cleared by GI  GI bleeding Anemia of chronic kidney disease -Baseline hemoglobin around 9-10.  Hemoglobin dropped to 6.7.  Status post 1 unit packed red cell transfusion on 12/17/2020.  Hemoglobin 7.9 today -Continue PPI IV twice a day.  Eliquis on hold.   -Status post EGD on 12/19/2020 which showed erythematous mucosa in stomach which was biopsied and erythematous to adenopathy.   -Status post colonoscopy on 12/20/2020 which showed blood in the entire examined colon; 1 sigmoid colon polyp was  removed;Nonbleeding external and internal hemorrhoids.  No obvious source of bleeding on small bowel video capsule study.  Follow further GI recommendations.  End-stage renal disease on hemodialysis Hyperkalemia: Resolved -Nephrology following.  Dialysis as per nephrology schedule  Chronic diastolic heart failure -Volume management as per hemodialysis  Diabetes mellitus type 1 with hypoglycemia -A1c 5.9.  Continue insulin pump.  Diabetes coordinator following.  Continue CBG checks  Paroxysmal A. Fib -Currently rate controlled.  Metoprolol and Eliquis on hold  Hypertension -Blood pressure intermittently elevated.  continue amlodipine and hydralazine Generalized weakness--PT/OT eval   DVT prophylaxis: SCDs Code Status: Full Family Communication: None at bedside Disposition Plan: Status is: Inpatient  Remains inpatient appropriate because:Inpatient level of care appropriate due to severity of illness   Dispo: The patient is from: Home              Anticipated d/c is to: Home              Patient currently is not medically stable to d/c.   Difficult to place patient No  Consultants: Nephrology/GI/cardiology  Procedures:  EGD on 12/19/2020 Impression:               - Normal esophagus.                           - Erythematous mucosa in the stomach. Biopsied.                           -  Erythematous duodenopathy.                           - Duodenal deformity. Recommendation:           - Return patient to hospital ward for ongoing care.                           - The signs and symptoms of potential delayed                            complications were discussed with the patient.                           - Clear liquid diet today.                           - Continue present medications.                           - Continue to hold Eliquis.                           - Await pathology results.                           - Perform a colonoscopy tomorrow.  Colonoscopy on  12/20/2020 Impression:               - Blood in the entire examined colon. No obvious                            source of lower GI bleed after extensive lavage and                            suction                           - One less than 1 mm polyp in the sigmoid colon,                            removed with a cold biopsy forceps. Resected and                            retrieved.                           - Non-bleeding external and internal hemorrhoids. Recommendation:           - Patient has a contact number available for                            emergencies. The signs and symptoms of potential                            delayed complications were discussed with the  patient. Return to normal activities tomorrow.                            Written discharge instructions were provided to the                            patient.                           - Plan to proceed with small bowel video capsule                            study to exclude small bowel GI bleed/AVM                           - NPO for 2 hours. Followed by Clear liquid for 6                            hours, then resume previous diet                           - Continue present medications.                           - Await pathology results.                           - Repeat colonoscopy in 5-10 years, date to be                            determined after pending pathology results are                            reviewed for surveillance based on pathology                            results.  Small bowel video capsule study  Antimicrobials: None   Subjective: Patient seen and examined at bedside.  No overnight fever, vomiting, worsening abdominal pain or chest pain reported. Objective: Vitals:   12/22/20 0016 12/22/20 0042 12/22/20 0432 12/22/20 0745  BP: (!) 133/34 (!) 140/52 (!) 167/49 (!) 161/55  Pulse: 63  64 67  Resp: '12  17 18  '$ Temp: 98.6 F (37 C)  98.2 F (36.8 C)  98.2 F (36.8 C)  TempSrc: Oral  Oral Oral  SpO2: 99%  100%   Weight:       No intake or output data in the 24 hours ending 12/22/20 0757 Filed Weights   12/18/20 1200 12/20/20 1425 12/20/20 1800  Weight: 57.6 kg 61.2 kg 58.8 kg    Examination:  General exam: Currently on room air.  No distress.  Sleepy, wakes up slightly, hardly participates in any conversation. respiratory system: Bilateral decreased breath sounds at bases with some scattered crackles  cardiovascular system: S1-S2 heard, rate controlled Gastrointestinal system: Abdomen is slightly distended, soft and nontender.  Bowel sounds heard  extremities: No cyanosis; lower extremity edema present bilaterally   Data Reviewed: I have personally reviewed following labs  and imaging studies  CBC: Recent Labs  Lab 12/18/20 0253 12/19/20 0149 12/20/20 0309 12/21/20 0228 12/22/20 0111  WBC 4.1 4.3 3.7* 3.8* 4.6  NEUTROABS 2.2 2.5 2.3 2.1 2.4  HGB 8.3* 8.9* 8.6* 8.6* 7.9*  HCT 24.4* 26.6* 25.6* 25.1* 23.9*  MCV 96.8 95.7 97.3 97.7 99.6  PLT 98* 129* 122* 151 [id number]   Basic Metabolic Panel: Recent Labs  Lab 12/18/20 0253 12/19/20 0149 12/20/20 0309 12/21/20 0228 12/22/20 0111  NA 136 133* 139 137 134*  K 4.1 4.7 4.3 4.2 4.0  CL 98 97* 103 98 99  CO2 '30 28 24 31 27  '$ GLUCOSE 79 183* 87 200* 143*  BUN 28* 25* 35* 15 31*  CREATININE 6.26* 5.08* 7.95* 5.32* 7.74*  CALCIUM 8.6* 8.2* 8.5* 7.9* 7.8*  PHOS 5.6* 4.1 4.2 4.1 4.1   GFR: CrCl cannot be calculated (Unknown ideal weight.). Liver Function Tests: Recent Labs  Lab 12/15/20 1636 12/16/20 0857 12/18/20 0253 12/19/20 0149 12/20/20 0309 12/21/20 0228 12/22/20 0111  AST 18  --   --   --   --   --   --   ALT 17  --   --   --   --   --   --   ALKPHOS 82  --   --   --   --   --   --   BILITOT 0.6  --   --   --   --   --   --   PROT 5.7*  --   --   --   --   --   --   ALBUMIN 3.3*   < > 3.2* 3.5 3.4* 3.3* 3.3*   < > = values in this interval not displayed.    No results for input(s): LIPASE, AMYLASE in the last 168 hours. No results for input(s): AMMONIA in the last 168 hours. Coagulation Profile: Recent Labs  Lab 12/15/20 1636  INR 1.3*   Cardiac Enzymes: No results for input(s): CKTOTAL, CKMB, CKMBINDEX, TROPONINI in the last 168 hours. BNP (last 3 results) No results for input(s): PROBNP in the last 8760 hours. HbA1C: No results for input(s): HGBA1C in the last 72 hours. CBG: Recent Labs  Lab 12/21/20 1556 12/21/20 2009 12/22/20 0013 12/22/20 0428 12/22/20 0744  GLUCAP 226* 215* 171* 71 132*   Lipid Profile: No results for input(s): CHOL, HDL, LDLCALC, TRIG, CHOLHDL, LDLDIRECT in the last 72 hours. Thyroid Function Tests: No results for input(s): TSH, T4TOTAL, FREET4, T3FREE, THYROIDAB in the last 72 hours. Anemia Panel: No results for input(s): VITAMINB12, FOLATE, FERRITIN, TIBC, IRON, RETICCTPCT in the last 72 hours. Sepsis Labs: No results for input(s): PROCALCITON, LATICACIDVEN in the last 168 hours.  Recent Results (from the past 240 hour(s))  Resp Panel by RT-PCR (Flu A&B, Covid) Nasopharyngeal Swab     Status: None   Collection Time: 12/15/20  8:57 PM   Specimen: Nasopharyngeal Swab; Nasopharyngeal(NP) swabs in vial transport medium  Result Value Ref Range Status   SARS Coronavirus 2 by RT PCR NEGATIVE NEGATIVE Final    Comment: (NOTE) SARS-CoV-2 target nucleic acids are NOT DETECTED.  The SARS-CoV-2 RNA is generally detectable in upper respiratory specimens during the acute phase of infection. The lowest concentration of SARS-CoV-2 viral copies this assay can detect is 138 copies/mL. A negative result does not preclude SARS-Cov-2 infection and should not be used as the sole basis for treatment or other patient management decisions. A negative result may occur with  improper specimen collection/handling, submission of specimen other than nasopharyngeal swab, presence of viral mutation(s) within the areas  targeted by this assay, and inadequate number of viral copies(<138 copies/mL). A negative result must be combined with clinical observations, patient history, and epidemiological information. The expected result is Negative.  Fact Sheet for Patients:  EntrepreneurPulse.com.au  Fact Sheet for Healthcare Providers:  IncredibleEmployment.be  This test is no t yet approved or cleared by the [place] FDA and  has been authorized for detection and/or diagnosis of SARS-CoV-2 by FDA under an Emergency Use Authorization (EUA). This EUA will remain  in effect (meaning this test can be used) for the duration of the COVID-19 declaration under Section 564(b)(1) of the Act, 21 U.S.C.section 360bbb-3(b)(1), unless the authorization is terminated  or revoked sooner.       Influenza A by PCR NEGATIVE NEGATIVE Final   Influenza B by PCR NEGATIVE NEGATIVE Final    Comment: (NOTE) The Xpert Xpress SARS-CoV-2/FLU/RSV plus assay is intended as an aid in the diagnosis of influenza from Nasopharyngeal swab specimens and should not be used as a sole basis for treatment. Nasal washings and aspirates are unacceptable for Xpert Xpress SARS-CoV-2/FLU/RSV testing.  Fact Sheet for Patients: EntrepreneurPulse.com.au  Fact Sheet for Healthcare Providers: IncredibleEmployment.be  This test is not yet approved or cleared by the [place] FDA and has been authorized for detection and/or diagnosis of SARS-CoV-2 by FDA under an Emergency Use Authorization (EUA). This EUA will remain in effect (meaning this test can be used) for the duration of the COVID-19 declaration under Section 564(b)(1) of the Act, 21 U.S.C. section 360bbb-3(b)(1), unless the authorization is terminated or revoked.  Performed at [facility], [place] [address], [place], [place] [address]          Radiology Studies: No results  found.      Scheduled Meds: . amLODipine  10 mg Oral Daily  . atorvastatin  80 mg Oral Daily  . calcium acetate  667 mg Oral TID WC  . Chlorhexidine Gluconate Cloth  6 each Topical Q0600  . cinacalcet  60 mg Oral Q M,W,F  . [START ON 12/25/2020] darbepoetin (ARANESP) injection - DIALYSIS  60 mcg Intravenous Q Wed-HD  . ezetimibe  10 mg Oral Daily  . hydrALAZINE  25 mg Oral Q8H  . insulin pump   Subcutaneous Q4H  . isosorbide mononitrate  60 mg Oral Daily  . pantoprazole (PROTONIX) IV  40 mg Intravenous Q12H  . ranolazine  1,000 mg Oral BID   Continuous Infusions: . sodium chloride            Aline August, MD [facility] 12/22/2020, 7:57 AM

## 2020-12-22 NOTE — Progress Notes (Signed)
[place] Dr Tonie Griffith was notified of pt B/P 133/34 HR 63, reported that pt is sitting in bed watching movie on computer and turns off progressive monitor when it starts to beep signaling a change in heart rate. Pt says" it is annoying and I can't sleep or watch anything without the beeping going off".  Dr Tonie Griffith requested a manual B/P and to continue to turn monitor back on when pt turns it off. Manual B/P is 140/52, monitor turned back on at this time.

## 2020-12-22 NOTE — Progress Notes (Signed)
PT was walking in the hall and became dizzy and had some trouble breathing. Pt was helped back to bed by tech and RN. BP was taken to 89/15, He was then put in trendelenburg BP came up some 128/35. He was then very hard to arouse so Rapid was called to bedside and RT. MD Alekh,Kshitiz was notified and colleague MD was send to bedside orders were given refer to [facility] and notes

## 2020-12-23 ENCOUNTER — Encounter: Payer: Self-pay | Admitting: Gastroenterology

## 2020-12-23 ENCOUNTER — Encounter (HOSPITAL_COMMUNITY): Payer: Self-pay | Admitting: Gastroenterology

## 2020-12-23 DIAGNOSIS — E1159 Type 2 diabetes mellitus with other circulatory complications: Secondary | ICD-10-CM

## 2020-12-23 DIAGNOSIS — I5032 Chronic diastolic (congestive) heart failure: Secondary | ICD-10-CM | POA: Diagnosis not present

## 2020-12-23 DIAGNOSIS — I480 Paroxysmal atrial fibrillation: Secondary | ICD-10-CM | POA: Diagnosis not present

## 2020-12-23 DIAGNOSIS — K921 Melena: Secondary | ICD-10-CM | POA: Diagnosis not present

## 2020-12-23 DIAGNOSIS — I152 Hypertension secondary to endocrine disorders: Secondary | ICD-10-CM

## 2020-12-23 DIAGNOSIS — E1169 Type 2 diabetes mellitus with other specified complication: Secondary | ICD-10-CM

## 2020-12-23 DIAGNOSIS — R001 Bradycardia, unspecified: Secondary | ICD-10-CM | POA: Diagnosis not present

## 2020-12-23 DIAGNOSIS — I214 Non-ST elevation (NSTEMI) myocardial infarction: Secondary | ICD-10-CM

## 2020-12-23 LAB — RENAL FUNCTION PANEL
Albumin: 3 g/dL — ABNORMAL LOW (ref 3.5–5.0)
Anion gap: 9 (ref 5–15)
BUN: 39 mg/dL — ABNORMAL HIGH (ref 8–23)
CO2: 26 mmol/L (ref 22–32)
Calcium: 7.6 mg/dL — ABNORMAL LOW (ref 8.9–10.3)
Chloride: 97 mmol/L — ABNORMAL LOW (ref 98–111)
Creatinine, Ser: 9.58 mg/dL — ABNORMAL HIGH (ref 0.61–1.24)
GFR, Estimated: 6 mL/min — ABNORMAL LOW (ref >60)
Glucose, Bld: 218 mg/dL — ABNORMAL HIGH (ref 70–99)
Phosphorus: 4.5 mg/dL (ref 2.5–4.6)
Potassium: 4.4 mmol/L (ref 3.5–5.1)
Sodium: 132 mmol/L — ABNORMAL LOW (ref 135–145)

## 2020-12-23 LAB — CBC WITH DIFFERENTIAL/PLATELET
Abs Immature Granulocytes: 0.01 10*3/uL (ref 0.00–0.07)
Basophils Absolute: 0 10*3/uL (ref 0.0–0.1)
Basophils Relative: 1 %
Eosinophils Absolute: 0.2 10*3/uL (ref 0.0–0.5)
Eosinophils Relative: 5 %
HCT: 23.6 % — ABNORMAL LOW (ref 39.0–52.0)
Hemoglobin: 7.8 g/dL — ABNORMAL LOW (ref 13.0–17.0)
Immature Granulocytes: 0 %
Lymphocytes Relative: 22 %
Lymphs Abs: 1 10*3/uL (ref 0.7–4.0)
MCH: 32.8 pg (ref 26.0–34.0)
MCHC: 33.1 g/dL (ref 30.0–36.0)
MCV: 99.2 fL (ref 80.0–100.0)
Monocytes Absolute: 0.5 10*3/uL (ref 0.1–1.0)
Monocytes Relative: 10 %
Neutro Abs: 3 10*3/uL (ref 1.7–7.7)
Neutrophils Relative %: 62 %
Platelets: 153 10*3/uL (ref 150–400)
RBC: 2.38 MIL/uL — ABNORMAL LOW (ref 4.22–5.81)
RDW: 14.9 % (ref 11.5–15.5)
WBC: 4.8 10*3/uL (ref 4.0–10.5)
nRBC: 0 % (ref 0.0–0.2)

## 2020-12-23 LAB — APTT: aPTT: 46 seconds — ABNORMAL HIGH (ref 24–36)

## 2020-12-23 LAB — GLUCOSE, CAPILLARY
Glucose-Capillary: 100 mg/dL — ABNORMAL HIGH (ref 70–99)
Glucose-Capillary: 111 mg/dL — ABNORMAL HIGH (ref 70–99)
Glucose-Capillary: 122 mg/dL — ABNORMAL HIGH (ref 70–99)
Glucose-Capillary: 134 mg/dL — ABNORMAL HIGH (ref 70–99)
Glucose-Capillary: 196 mg/dL — ABNORMAL HIGH (ref 70–99)
Glucose-Capillary: 203 mg/dL — ABNORMAL HIGH (ref 70–99)

## 2020-12-23 LAB — HEPARIN LEVEL (UNFRACTIONATED): Heparin Unfractionated: <0.1 IU/mL — ABNORMAL LOW (ref 0.30–0.70)

## 2020-12-23 LAB — SURGICAL PATHOLOGY

## 2020-12-23 MED ORDER — DARBEPOETIN ALFA 60 MCG/0.3ML IJ SOSY: 60.0000 ug | PREFILLED_SYRINGE | INTRAMUSCULAR | Status: DC

## 2020-12-23 MED ORDER — HEPARIN (PORCINE) 25000 UT/250ML-% IV SOLN
750.0000 [IU]/h | INTRAVENOUS | Status: DC
  Administered 2020-12-23: 700 [IU]/h via INTRAVENOUS
  Filled 2020-12-23 (×2): qty 250

## 2020-12-23 NOTE — Progress Notes (Signed)
  No further bleeding per rectum. Hgb 7.8, 7.9 yesterday. 6.7 nadir, 8.6 at peak.   S/p EGD, colon, VCE:  Reactive gastropathy, gastritis, duodenitis, postulcer deformity in duodenal bulb.   No source for blood seen throughout the colon.  Small HP polyp removed.  Nonbleeding hemorrhoids.   No fresh or old blood or source for bleeding seen on VCE.  GI signing off.  Available prn.  Azucena Freed PA-C

## 2020-12-23 NOTE — Progress Notes (Signed)
Patient ID: Kenneth [name], male   DOB: 10-Oct-1955, 65 y.o.   MRN: [id number]  PROGRESS NOTE    Kenneth [name]  [id number] DOB: [birth year] DOA: 12/15/2020 PCP: [name], MD   Brief Narrative:  65 y.o.malewith medical history significant of HLD, HTN, DM1, A.fib, ESRD on HD, CAD sp CABG presented with CP tightness and weakness along with intermittent bloody stools.  On EMS arrival, patient was bradycardic into the 40s and poorly responsive for which she was given 1 mg of atropine and heart rate went up to 40 to 60s.  In the ED, potassium was 6.2, EKG showed junctional rhythm; he was given Lokelma, glucose and insulin.  Nephrology is consulted and he subsequently had HD.  He also was found to have hemoglobin drop of 2 units with Hemoccult positive stool.  Status post EGD on 12/23/2020 and colonoscopy on 12/20/2020.  Assessment & Plan:  Acute encephalopathy -On the afternoon of 12/22/2020, patient became slightly dizzy while walking with drop in blood pressure which improved after laying down in bed.  Subsequently, patient had a brief.  With decreased level of consciousness along with bradypnea with slight confusion after that.  He was given small amount of IV fluid bolus and antihypertensives were held.  Chest x-ray and ABG were unremarkable.  CT of the head did not show signs of acute intracranial abnormality.  Insulin pump was discontinued. -Mental status improved since then.  Monitor   Symptomatic bradycardia -Status post atropine on presentation.  Heart rate in the 60s to 70s currently.  Beta-blocker on hold. -EKG showed no acute ST changes -Echo in 3/22 showed EF of 55 to 60% with grade 2 diastolic dysfunction -Cardiology following  Possible non-STEMI versus demand ischemia in a patient with history of CAD status post CABG Hyperlipidemia -Troponin peaked up to more than 700.  EKG with no acute ST changes.  Cardiology following and no planning on conservative management.  Continue statin  and Zetia.  Imdur, losartan and metoprolol on hold.    GI bleeding Anemia of chronic kidney disease -Baseline hemoglobin around 9-10.  Hemoglobin dropped to 6.7.  Status post 1 unit packed red cell transfusion on 12/17/2020.  Hemoglobin 7.8 today -Continue PPI IV twice a day.  Eliquis on hold.   -Status post EGD on 12/19/2020 which showed erythematous mucosa in stomach which was biopsied and erythematous to adenopathy.   -Status post colonoscopy on 12/20/2020 which showed blood in the entire examined colon; 1 sigmoid colon polyp was removed;Nonbleeding external and internal hemorrhoids.  No obvious source of bleeding on small bowel video capsule study.  Follow further GI recommendations.  End-stage renal disease on hemodialysis Hyperkalemia: Resolved -Nephrology following.  Dialysis as per nephrology schedule  Chronic diastolic heart failure -Volume management as per hemodialysis  Diabetes mellitus type 1 with hypoglycemia -A1c 5.9.  Insulin pump plan as above.  Will do CBGs for now.  Blood sugars on the lower side intermittently  Paroxysmal A. Fib -Currently rate controlled.  Metoprolol and Eliquis on hold.  Cardiology is planning to start heparin today  Hypertension -Antihypertensives held on 12/22/2020.  Monitor.  Generalized weakness--PT/OT eval   DVT prophylaxis: SCDs Code Status: Full Family Communication: None at bedside Disposition Plan: Status is: Inpatient  Remains inpatient appropriate because:Inpatient level of care appropriate due to severity of illness   Dispo: The patient is from: Home              Anticipated d/c is to: Home  Patient currently is not medically stable to d/c.   Difficult to place patient No  Consultants: Nephrology/GI/cardiology  Procedures:  EGD on 12/19/2020 Impression:               - Normal esophagus.                           - Erythematous mucosa in the stomach. Biopsied.                           - Erythematous duodenopathy.                            - Duodenal deformity. Recommendation:           - Return patient to hospital ward for ongoing care.                           - The signs and symptoms of potential delayed                            complications were discussed with the patient.                           - Clear liquid diet today.                           - Continue present medications.                           - Continue to hold Eliquis.                           - Await pathology results.                           - Perform a colonoscopy tomorrow.  Colonoscopy on 12/20/2020 Impression:               - Blood in the entire examined colon. No obvious                            source of lower GI bleed after extensive lavage and                            suction                           - One less than 1 mm polyp in the sigmoid colon,                            removed with a cold biopsy forceps. Resected and                            retrieved.                           - Non-bleeding external and internal hemorrhoids. Recommendation:           -  Patient has a contact number available for                            emergencies. The signs and symptoms of potential                            delayed complications were discussed with the                            patient. Return to normal activities tomorrow.                            Written discharge instructions were provided to the                            patient.                           - Plan to proceed with small bowel video capsule                            study to exclude small bowel GI bleed/AVM                           - NPO for 2 hours. Followed by Clear liquid for 6                            hours, then resume previous diet                           - Continue present medications.                           - Await pathology results.                           - Repeat colonoscopy in 5-10 years, date to be                             determined after pending pathology results are                            reviewed for surveillance based on pathology                            results.  Small bowel video capsule study  Antimicrobials: None   Subjective: Patient seen and examined at bedside.  Denies current chest pain.  Denies current nausea, vomiting.  Had a brief episode of decreased consciousness with decreased frequency of breathing yesterday afternoon as per nursing staff.   Objective: Vitals:   12/22/20 1426 12/22/20 1710 12/22/20 1954 12/23/20 0653  BP: (!) 89/15  (!) 133/54 (!) 125/43  Pulse:   64 68  Resp:   16 14  Temp:   97.6 F (36.4 C) 97.8 F (36.6 C)  TempSrc:      SpO2:  96% 98% 98%  Weight:        Intake/Output Summary (Last 24 hours) at 12/23/2020 0749 Last data filed at 12/22/2020 1800 Gross per 24 hour  Intake --  Output 2200 ml  Net -2200 ml   Filed Weights   12/18/20 1200 12/20/20 1425 12/20/20 1800  Weight: 57.6 kg 61.2 kg 58.8 kg    Examination:  General exam: No acute distress.  Currently on room air.  Currently answers some questions appropriately respiratory system: Decreased breath sounds at bases bilaterally with basilar crackles cardiovascular system: Rate controlled, S1-S2 heard Gastrointestinal system: Abdomen is distended mildly, soft and nontender.  Bowel sounds are heard  extremities: Mild lower extremity edema present; no clubbing  Data Reviewed: I have personally reviewed following labs and imaging studies  CBC: Recent Labs  Lab 12/19/20 0149 12/20/20 0309 12/21/20 0228 12/22/20 0111 12/23/20 0122  WBC 4.3 3.7* 3.8* 4.6 4.8  NEUTROABS 2.5 2.3 2.1 2.4 3.0  HGB 8.9* 8.6* 8.6* 7.9* 7.8*  HCT 26.6* 25.6* 25.1* 23.9* 23.6*  MCV 95.7 97.3 97.7 99.6 99.2  PLT 129* 122* 151 150 0000000   Basic Metabolic Panel: Recent Labs  Lab 12/19/20 0149 12/20/20 0309 12/21/20 0228 12/22/20 0111 12/23/20 0122  NA 133* 139 137 134* 132*  K 4.7 4.3 4.2 4.0 4.4   CL 97* 103 98 99 97*  CO2 '28 24 31 27 26  '$ GLUCOSE 183* 87 200* 143* 218*  BUN 25* 35* 15 31* 39*  CREATININE 5.08* 7.95* 5.32* 7.74* 9.58*  CALCIUM 8.2* 8.5* 7.9* 7.8* 7.6*  PHOS 4.1 4.2 4.1 4.1 4.5   GFR: CrCl cannot be calculated (Unknown ideal weight.). Liver Function Tests: Recent Labs  Lab 12/19/20 0149 12/20/20 0309 12/21/20 0228 12/22/20 0111 12/23/20 0122  ALBUMIN 3.5 3.4* 3.3* 3.3* 3.0*   No results for input(s): LIPASE, AMYLASE in the last 168 hours. No results for input(s): AMMONIA in the last 168 hours. Coagulation Profile: No results for input(s): INR, PROTIME in the last 168 hours. Cardiac Enzymes: No results for input(s): CKTOTAL, CKMB, CKMBINDEX, TROPONINI in the last 168 hours. BNP (last 3 results) No results for input(s): PROBNP in the last 8760 hours. HbA1C: No results for input(s): HGBA1C in the last 72 hours. CBG: Recent Labs  Lab 12/22/20 1550 12/22/20 1642 12/22/20 1956 12/22/20 2349 12/23/20 0333  GLUCAP 82 85 136* 191* 134*   Lipid Profile: No results for input(s): CHOL, HDL, LDLCALC, TRIG, CHOLHDL, LDLDIRECT in the last 72 hours. Thyroid Function Tests: No results for input(s): TSH, T4TOTAL, FREET4, T3FREE, THYROIDAB in the last 72 hours. Anemia Panel: No results for input(s): VITAMINB12, FOLATE, FERRITIN, TIBC, IRON, RETICCTPCT in the last 72 hours. Sepsis Labs: No results for input(s): PROCALCITON, LATICACIDVEN in the last 168 hours.  Recent Results (from the past 240 hour(s))  Resp Panel by RT-PCR (Flu A&B, Covid) Nasopharyngeal Swab     Status: None   Collection Time: 12/15/20  8:57 PM   Specimen: Nasopharyngeal Swab; Nasopharyngeal(NP) swabs in vial transport medium  Result Value Ref Range Status   SARS Coronavirus 2 by RT PCR NEGATIVE NEGATIVE Final    Comment: (NOTE) SARS-CoV-2 target nucleic acids are NOT DETECTED.  The SARS-CoV-2 RNA is generally detectable in upper respiratory specimens during the acute phase of  infection. The lowest concentration of SARS-CoV-2 viral copies this assay can detect is 138 copies/mL. A negative result does not preclude SARS-Cov-2 infection and should not be used as the sole basis for treatment or other patient management  decisions. A negative result may occur with  improper specimen collection/handling, submission of specimen other than nasopharyngeal swab, presence of viral mutation(s) within the areas targeted by this assay, and inadequate number of viral copies(<138 copies/mL). A negative result must be combined with clinical observations, patient history, and epidemiological information. The expected result is Negative.  Fact Sheet for Patients:  EntrepreneurPulse.com.au  Fact Sheet for Healthcare Providers:  IncredibleEmployment.be  This test is no t yet approved or cleared by the [place] FDA and  has been authorized for detection and/or diagnosis of SARS-CoV-2 by FDA under an Emergency Use Authorization (EUA). This EUA will remain  in effect (meaning this test can be used) for the duration of the COVID-19 declaration under Section 564(b)(1) of the Act, 21 U.S.C.section 360bbb-3(b)(1), unless the authorization is terminated  or revoked sooner.       Influenza A by PCR NEGATIVE NEGATIVE Final   Influenza B by PCR NEGATIVE NEGATIVE Final    Comment: (NOTE) The Xpert Xpress SARS-CoV-2/FLU/RSV plus assay is intended as an aid in the diagnosis of influenza from Nasopharyngeal swab specimens and should not be used as a sole basis for treatment. Nasal washings and aspirates are unacceptable for Xpert Xpress SARS-CoV-2/FLU/RSV testing.  Fact Sheet for Patients: EntrepreneurPulse.com.au  Fact Sheet for Healthcare Providers: IncredibleEmployment.be  This test is not yet approved or cleared by the [place] FDA and has been authorized for detection and/or diagnosis of SARS-CoV-2  by FDA under an Emergency Use Authorization (EUA). This EUA will remain in effect (meaning this test can be used) for the duration of the COVID-19 declaration under Section 564(b)(1) of the Act, 21 U.S.C. section 360bbb-3(b)(1), unless the authorization is terminated or revoked.  Performed at [facility], [place] [address], [place],  [address]          Radiology Studies: CT HEAD WO CONTRAST  Result Date: 12/22/2020 CLINICAL DATA:  Encephalopathy EXAM: CT HEAD WITHOUT CONTRAST TECHNIQUE: Contiguous axial images were obtained from the base of the skull through the vertex without intravenous contrast. COMPARISON:  08/27/2019 FINDINGS: Brain: There is no mass, hemorrhage or extra-axial collection. The size and configuration of the ventricles and extra-axial CSF spaces are normal. There is hypoattenuation of the white matter, most commonly indicating chronic small vessel disease. Vascular: No abnormal hyperdensity of the major intracranial arteries or dural venous sinuses. No intracranial atherosclerosis. Skull: The visualized skull base, calvarium and extracranial soft tissues are normal. Sinuses/Orbits: No fluid levels or advanced mucosal thickening of the visualized paranasal sinuses. No mastoid or middle ear effusion. The orbits are normal. IMPRESSION: Chronic small vessel disease without acute intracranial abnormality. Electronically Signed   By: Ulyses Jarred M.D.   On: 12/22/2020 19:21   DG Chest Port 1 View  Result Date: 12/22/2020 CLINICAL DATA:  Dyspnea. EXAM: PORTABLE CHEST 1 VIEW COMPARISON:  12/15/2020 FINDINGS: Stable enlarged cardiac silhouette and post CABG changes. Stable small amount of linear scarring or atelectasis in the left lower lung zone. The remainder of the lungs remain clear. Left subclavian stent. Unremarkable bones. IMPRESSION: No acute abnormality. Stable cardiomegaly. Electronically Signed   By: Claudie Revering M.D.   On: 12/22/2020 17:41         Scheduled Meds: . atorvastatin  80 mg Oral Daily  . calcium acetate  667 mg Oral TID WC  . Chlorhexidine Gluconate Cloth  6 each Topical Q0600  . cinacalcet  60 mg Oral Q M,W,F  . [START ON 12/25/2020] darbepoetin (ARANESP) injection -  DIALYSIS  60 mcg Intravenous Q Wed-HD  . ezetimibe  10 mg Oral Daily  . insulin aspart  0-9 Units Subcutaneous Q4H  . ranolazine  500 mg Oral BID   Continuous Infusions: . sodium chloride    . sodium chloride            Aline August, MD [facility] 12/23/2020, 7:49 AM

## 2020-12-23 NOTE — Progress Notes (Signed)
Progress Note  Patient Name: Kenneth [name] Date of Encounter: 12/23/2020  Primary Cardiologist: Loralie Champagne MD  Subjective   Overnight there was concern for AM subsequent to hypotension.  CT Head performed negative for acute process, give 500 cc IVF.  Patient notes that he was sleepy, but feels fine this morning.  Patient notes that he had had no loose stools of bleeding.  Notes that he was supposed to take a blood thinner because of thick blood and risk of stroke.  No chest pain, notes being tired but only in the setting that I woke him from sleep.  Interpretor service used.  Inpatient Medications    Scheduled Meds: . atorvastatin  80 mg Oral Daily  . calcium acetate  667 mg Oral TID WC  . Chlorhexidine Gluconate Cloth  6 each Topical Q0600  . cinacalcet  60 mg Oral Q M,W,F  . [START ON 12/25/2020] darbepoetin (ARANESP) injection - DIALYSIS  60 mcg Intravenous Q Wed-HD  . ezetimibe  10 mg Oral Daily  . insulin aspart  0-9 Units Subcutaneous Q4H  . ranolazine  500 mg Oral BID   Continuous Infusions: . sodium chloride    . sodium chloride     PRN Meds: acetaminophen, sodium chloride   Vital Signs    Vitals:   12/22/20 1426 12/22/20 1710 12/22/20 1954 12/23/20 0653  BP: (!) 89/15  (!) 133/54 (!) 125/43  Pulse:   64 68  Resp:   16 14  Temp:   97.6 F (36.4 C) 97.8 F (36.6 C)  TempSrc:      SpO2:  96% 98% 98%  Weight:        Intake/Output Summary (Last 24 hours) at 12/23/2020 0758 Last data filed at 12/22/2020 1800 Gross per 24 hour  Intake --  Output 2200 ml  Net -2200 ml   Filed Weights   12/18/20 1200 12/20/20 1425 12/20/20 1800  Weight: 57.6 kg 61.2 kg 58.8 kg    Telemetry    SR - Personally Reviewed  ECG    No new last 24 - Personally Reviewed  Physical Exam   GEN: No acute distress.   Neck: No JVD Cardiac: RRR, no rubs, or gallops; Continuous murmur in all fields but best in LUSB associated with L arm bruit with good thrill Respiratory:  Clear to auscultation bilaterally. GI: Soft, nontender, non-distended  MS: No edema; No deformity. Neuro:  Nonfocal  Psych: Normal affect   Labs    Chemistry Recent Labs  Lab 12/21/20 0228 12/22/20 0111 12/23/20 0122  NA 137 134* 132*  K 4.2 4.0 4.4  CL 98 99 97*  CO2 '31 27 26  '$ GLUCOSE 200* 143* 218*  BUN 15 31* 39*  CREATININE 5.32* 7.74* 9.58*  CALCIUM 7.9* 7.8* 7.6*  ALBUMIN 3.3* 3.3* 3.0*  GFRNONAA 11* 7* 6*  ANIONGAP '8 8 9     '$ Hematology Recent Labs  Lab 12/21/20 0228 12/22/20 0111 12/23/20 0122  WBC 3.8* 4.6 4.8  RBC 2.57* 2.40* 2.38*  HGB 8.6* 7.9* 7.8*  HCT 25.1* 23.9* 23.6*  MCV 97.7 99.6 99.2  MCH 33.5 32.9 32.8  MCHC 34.3 33.1 33.1  RDW 14.6 14.5 14.9  PLT 151 150 153    Cardiac EnzymesNo results for input(s): TROPONINI in the last 168 hours. No results for input(s): TROPIPOC in the last 168 hours.   BNPNo results for input(s): BNP, PROBNP in the last 168 hours.   DDimer No results for input(s): DDIMER in the last  168 hours.   Radiology    CT HEAD WO CONTRAST  Result Date: 12/22/2020 CLINICAL DATA:  Encephalopathy EXAM: CT HEAD WITHOUT CONTRAST TECHNIQUE: Contiguous axial images were obtained from the base of the skull through the vertex without intravenous contrast. COMPARISON:  08/27/2019 FINDINGS: Brain: There is no mass, hemorrhage or extra-axial collection. The size and configuration of the ventricles and extra-axial CSF spaces are normal. There is hypoattenuation of the white matter, most commonly indicating chronic small vessel disease. Vascular: No abnormal hyperdensity of the major intracranial arteries or dural venous sinuses. No intracranial atherosclerosis. Skull: The visualized skull base, calvarium and extracranial soft tissues are normal. Sinuses/Orbits: No fluid levels or advanced mucosal thickening of the visualized paranasal sinuses. No mastoid or middle ear effusion. The orbits are normal. IMPRESSION: Chronic small vessel disease  without acute intracranial abnormality. Electronically Signed   By: Ulyses Jarred M.D.   On: 12/22/2020 19:21   DG Chest Port 1 View  Result Date: 12/22/2020 CLINICAL DATA:  Dyspnea. EXAM: PORTABLE CHEST 1 VIEW COMPARISON:  12/15/2020 FINDINGS: Stable enlarged cardiac silhouette and post CABG changes. Stable small amount of linear scarring or atelectasis in the left lower lung zone. The remainder of the lungs remain clear. Left subclavian stent. Unremarkable bones. IMPRESSION: No acute abnormality. Stable cardiomegaly. Electronically Signed   By: Claudie Revering M.D.   On: 12/22/2020 17:41    Cardiac Studies   TTE 11/28/20: 1. Left ventricular ejection fraction, by estimation, is 55 to 60%. The  left ventricle has normal function. The left ventricle has no regional  wall motion abnormalities. There is moderate left ventricular hypertrophy.  Left ventricular diastolic  parameters are consistent with Grade II diastolic dysfunction  (pseudonormalization). Elevated left atrial pressure.  2. Right ventricular systolic function is normal. The right ventricular  size is normal.  3. The mitral valve is normal in structure. Trivial mitral valve  regurgitation. No evidence of mitral stenosis.  4. The aortic valve is tricuspid. Aortic valve regurgitation is trivial.  Mild aortic valve sclerosis is present, with no evidence of aortic valve  stenosis.  5. The inferior vena cava is normal in size with greater than 50%  respiratory variability, suggesting right atrial pressure of 3 mmHg.   Cath 04/17/2019: 1. Occluded SVG-RCA and SVG-ramus, patent LIMA-LAD.  2. The large ramus was patent with 40-50% mid ramus stenosis (in-stent restenosis).  3. 80% ostial stenosis very small caliber AV LCx. Not interventional target.  4. Occluded RCA (known from prior) with collaterals.   No target for intervention. Aggressive medical management of angina.  Myoview 11/15/18:  Baseline EKG showed NSR with LVH  and repolarization abnormality in the inferolateral leads. No change in ST segments during Lexiscan infusion.  Nuclear stress EF: 54%.  The left ventricular ejection fraction is mildly decreased (45-54%).  There is a small defect of mild severity present in the basal inferior and mid inferior location. The defect is non-reversible and in the setting of normal LVF is consistent with diaphragmatic attenuation artifact. No ischemia noted.  This is a low risk study.   Small bowel video capsule study 12/23/20 Small bowel video capsule study shows the capsule passing quickly into the duodenum. There is fair visualization of the small bowel since there is retained fluid and debris. Overall that is typical for video capsule study. Relatively fast transit time about 2 hours and 25 minutes into the colon, at which time no fresh or old blood was seen in the right colon. Little or  no mucosal visualization of the colon is obtained on this study. There was no fresh or old blood throughout the small bowel. No mucosal abnormality such as AVM tumor or inflammation were visualized in the small bowel.  Colonoscopy 12/20/20: No source of bleed; polyp removed. Coffee ground vs melena seen through the colon.  EGD 12/19/20: moderate post-ulcer deformity in the duodenal bulb, erythematous mucosa in the stomach; no active source of bleeding  Patient Profile     65 y.o. male HTN with DM, HLD with DM, PAF CHASVASC 4 (query of HFpEF) HASBLED 4, CAD s/p CABG (LIMA-LAD, SVG-OM- occluded, SVG-RCA- occluded) and multiple PCI BID bleed with duodenal ulcer.  Found to gave GI bleed, bradycardia, and chest tightness with tropon peak of 745 12/17/20.   Assessment & Plan    NSTEMI PAF CHADSVASC 4, HASLBED 4 HTN with DM HLD with DM; hx of Carotid Stenosis Symptomatic Bradycardia ESRD HFpEF - asymptomatic presently - anatomy:  LIMA-LAD, SVG-OM- occluded, SVG-RCA- occluded with RI ISR and no clear targets in 2020 -  Patient and prior cardiologist had long shared decision making conversation: If recurrent chest pain despite resolution of anemia, will reconsider coronary angiography - planned 4/11 for trial of heparin (ordered); after discussing with hospitalist team will trial heparin today; if stable DOAC 4/12 and no secondary agent at discharge  - continue statin and zetia; at goal LDL < 70 - will hold AV nodal agents; will re-evaluate if symptomatic AF RVR occurs; outpatient given sub-optimal targets EP follow up could be considered - Continue Imdur 60 mg if BP improves this AM this is the one agent we are able to up-titrate left; would prioritize this agent over Norvasc and hydralazine to increase - continue Ranexa 500 mg PO BID (at max dose with consideration to ESRD)  Discussed with patient via interpretor and primary MD.  No further questions.  For questions or updates, please contact [place] Please consult [email or web address] for contact info under Cardiology/STEMI.      Signed, Werner Lean, MD  12/23/2020, 7:58 AM

## 2020-12-23 NOTE — Progress Notes (Signed)
Inpatient Diabetes Program Recommendations  AACE/ADA: New Consensus Statement on Inpatient Glycemic Control   Target Ranges:  Prepandial:   less than 140 mg/dL      Peak postprandial:   less than 180 mg/dL (1-2 hours)      Critically ill patients:  140 - 180 mg/dL   Results for Kenneth [name], Kenneth [name] (MRN [id number]) as of 12/23/2020 09:44  Ref. Range 12/22/2020 07:44 12/22/2020 11:52 12/22/2020 15:50 12/22/2020 16:42 12/22/2020 19:56 12/22/2020 23:49 12/23/2020 03:33 12/23/2020 08:19  Glucose-Capillary Latest Ref Range: 70 - 99 mg/dL 132 (H) 90 82 85 136 (H) 191 (H) 134 (H) 122 (H)   Review of Glycemic Control  Diabetes history: DM1 Outpatient Diabetes medications: Medtronic insulin pump Current orders for Inpatient glycemic control: Novolog 0-9 units Q4H  Inpatient Diabetes Program Recommendations:    Insulin: If CBGs and Novolog frequency are changed to AC&HS or glucose is consistently over 180 mg/dl,  please consider ordering Lantus 5 units Q24H if SQ insulin will continue to be used for glycemic control while inpatient.  NOTE: Noted in chart, patient was using insulin pump for inpatient glycemic control and now only has Novlog 0-9 units Q4H ordered.  Per note by Dr. Posey Pronto on 12/22/20 at 5:42 pm patient had episode of worsening mental status change following tachypnea, bradypnea, and apnea; so insulin pump was discontinued and sliding scale insulin ordered. Spoke with patient at bedside and patient confirms that he removed his insulin pump yesterday evening when advised to do so by hospital staff. Patient has Medtronic insulin pump at bedside (on bedside table). Reviewed insulin pump settings which are: Basal 12A 0.45 units 6A 0.5 units 10P 0.45 units Total basal: 11.6 units/24 hours  Insulin to Carb Ratio: 1:12 grams (1 unit for 12 grams of carbs) Insulin Sensitivity Factor: 1:60 (1 unit drops glucose 60 mg/dl) Target Glucose: 100-120 mg/dl Active insulin time: 4 hours  Patient currently has  Novolog correction scale Q4H ordered. Since patient has Type 1 DM, he will consistently need insulin. Agree with current Novolog correction Q4H. If CBGs and Novolog frequency will be changed to AC&HS or if CBGs become consistently over 180 mg/dl, would recommend ordering low dose basal insulin (such as Lantus 5 units Q24H).   Thanks, Barnie Alderman, RN, MSN, CDE Diabetes Coordinator Inpatient Diabetes Program [phone] (Team Pager from 8am to 5pm)

## 2020-12-23 NOTE — Progress Notes (Addendum)
ANTICOAGULATION CONSULT NOTE - Initial Consult  Pharmacy Consult for Heparin  Indication: atrial fibrillation   No Known Allergies  Patient Measurements: Height: '5\' 7"'$  (170.2 cm) (recorded 10/03/2019) Weight: 58.8 kg (129 lb 10.1 oz) IBW/kg (Calculated) : 66.1 Heparin Dosing Weight:  58.8 kg   Vital Signs: Temp: 97.8 F (36.6 C) (04/11 0653) BP: 125/43 (04/11 0653) Pulse Rate: 68 (04/11 0653)  Labs: Recent Labs    12/21/20 0228 12/22/20 0111 12/23/20 0122  HGB 8.6* 7.9* 7.8*  HCT 25.1* 23.9* 23.6*  PLT 151 150 153  CREATININE 5.32* 7.74* 9.58*    Estimated Creatinine Clearance: 6.6 mL/min (A) (by C-G formula based on SCr of 9.58 mg/dL (H)).   Medical History: Past Medical History:  Diagnosis Date  . Acute pulmonary edema ([place]) 05/31/2016  . Acute respiratory failure with hypoxemia ([place]) 05/31/2016  . CAD (coronary artery disease) 10/30/2016   NSTEMI 9/17 with CABG x 3 (LIMA-LAD, SVG-OM, SVG-RCA).   - NSTEMI 10/18 s/p DES to ostial SVG to  OM  . Depression   . Diabetic foot ulcer ([place]) 04/11/2017  . 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 ESRD (end stage renal disease) on dialysis [facility])    "MWF; [place]" (03/30/2018)  . Gastroesophageal reflux disease 08/18/2016  . HCAP (healthcare-associated pneumonia)   . Hemodialysis status ([place])   . Hyperlipidemia   . Hypertension   . Hypertensive heart disease 09/12/2017  . Ischemic rest pain of lower extremity 02/06/2015  . 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 NSTEMI (non-ST elevated myocardial infarction) ([place])   . PAF (paroxysmal atrial fibrillation) ([place])   . Pleural effusion   . Pronation deformity of both feet 02/27/2015    Medications:  Medications Prior to Admission  Medication Sig Dispense Refill Last Dose  . acetaminophen (TYLENOL) 500 MG tablet Take 1,000 mg by mouth every 6 (six) hours as needed for mild pain or headache.    Past Week at Unknown time  . amLODipine  (NORVASC) 10 MG tablet Take 10 mg by mouth at bedtime.    12/14/2020  . Ascorbic Acid (VITAMIN C) 1000 MG tablet Take 1,000 mg by mouth daily.   12/15/2020 at Unknown time  . atorvastatin (LIPITOR) 80 MG tablet Take 1 tablet by mouth once daily (Patient taking differently: Take 80 mg by mouth daily before lunch.) 90 tablet 3 12/15/2020 at Unknown time  . calcium acetate (PHOSLO) 667 MG capsule Take 667 mg by mouth 3 (three) times daily with meals.    12/15/2020 at Unknown time  . citalopram (CELEXA) 20 MG tablet Take 20 mg by mouth daily with lunch.   1 12/15/2020 at Unknown time  . ELIQUIS 5 MG TABS tablet Take 1 tablet by mouth twice daily 180 tablet 3 12/15/2020 at AM  . ezetimibe (ZETIA) 10 MG tablet Take 1 tablet by mouth once daily 90 tablet 3 12/15/2020 at Unknown time  . gabapentin (NEURONTIN) 100 MG capsule Take 100 mg by mouth daily with lunch.   0 12/15/2020 at Unknown time  . hydrALAZINE (APRESOLINE) 25 MG tablet TAKE 1 TABLET BY MOUTH EVERY 8 HOURS (Patient taking differently: Take 25 mg by mouth every 8 (eight) hours.) 270 tablet 0 12/15/2020 at Unknown time  . Insulin Human (INSULIN PUMP) SOLN Inject into the skin continuous. NOVOLOG     . isosorbide mononitrate (IMDUR) 60 MG 24 hr tablet Take 1 tablet by mouth once daily 90 tablet 3 12/15/2020 at Unknown time  .  losartan (COZAAR) 100 MG tablet Take 100 mg by mouth at bedtime.    12/14/2020  . NOVOLOG 100 UNIT/ML injection Inject 6-10 Units into the skin See admin instructions. Starts with 10 units per day and adds 6 units with each meal via pump   12/15/2020 at Unknown time  . pantoprazole (PROTONIX) 40 MG tablet Take 40 mg by mouth daily before lunch.   0 12/15/2020 at Unknown time    Assessment: 65 y.o male on apixaban prior to admission for h/o Atrial fibrillation. Last dose taken on 12/15/20 AM.  This admission found to have GI bleed, bradycardia, and chest tightness with tropon peak of 735 on 12/17/20.   Also has anemia of CKD / ESRD - on HD.  Transfused  pRBCs on 12/17/20.  S/p colonoscopy on 12/20/20 showed blood in the entire examined colon; 1 sigmoid colon polyp was removed; Nonbleeding external and internal hemorrhoids.  No obvious source of bleeding on small bowel video capsule study.  Today Hgb 7.8, pltc stable at 153K.   CT head 4/10 negative for acute process.  No loose stools of bleeding. Cardiologist has ordered a trial of anticoagulation with IV heparin infusion starting today 4/11 and if stable, plans to transition back to [place] on  4/12 and no secondary agent at discharge.   We will begin IV heparin infusion at lower rate ~12 units/kg/hr, no bolus, and target lower end of therapeutic heparin level goal 0.3-0.5 units/ml.     Goal of Therapy:  Heparin level 0.3-0.5 units/ml Monitor platelets by anticoagulation protocol: Yes   Plan:  Start Heparin IV drip 700 units/hr (no bolus) Check 6 hour heparin level, PTT Daily Heparin level and CBC Monitor for signs and symptoms of bleeding   Thank you for allowing pharmacy to be part of this patients care team.  Nicole Cella, [place] Pharmacist [phone] Please check AMION for all [place] phone numbers After 10:00 PM, call [place] [phone] 12/23/2020,9:22 AM

## 2020-12-23 NOTE — Progress Notes (Addendum)
[facility] Progress Note  Subjective: Seen and examined bedside.  Was dizzy and hypotensive after walking around yesterday.  Symptoms improved with Trendelenburg 100 cc of fluid.  His insulin pump was also turned off.  Feels much better, no other acute events overnight  Vitals:   12/22/20 1710 12/22/20 1954 12/23/20 0653 12/23/20 0900  BP:  (!) 133/54 (!) 125/43   Pulse:  64 68   Resp:  16 14   Temp:  97.6 F (36.4 C) 97.8 F (36.6 C)   TempSrc:      SpO2: 96% 98% 98%   Weight:      Height:    '5\' 7"'$  (1.702 m)    Exam: General: NAD CV: S1-S2 Resp: CTA BL Abdomen: Soft, nontender/nondistended Ext" no edema bilateral lower extremities Neuro: Awake, alert, moves all extremities spontaneously Access: aVF +b/t    Recent Labs  Lab 12/22/20 0111 12/23/20 0122  K 4.0 4.4  BUN 31* 39*  CREATININE 7.74* 9.58*  CALCIUM 7.8* 7.6*  PHOS 4.1 4.5  HGB 7.9* 7.8*   Inpatient medications: . atorvastatin  80 mg Oral Daily  . calcium acetate  667 mg Oral TID WC  . Chlorhexidine Gluconate Cloth  6 each Topical Q0600  . cinacalcet  60 mg Oral Q M,W,F  . [START ON 12/25/2020] darbepoetin (ARANESP) injection - DIALYSIS  60 mcg Intravenous Q Wed-HD  . ezetimibe  10 mg Oral Daily  . insulin aspart  0-9 Units Subcutaneous Q4H  . ranolazine  500 mg Oral BID   . sodium chloride    . heparin 700 Units/hr (12/23/20 1022)  . sodium chloride     acetaminophen, sodium chloride     OP HD: MWF AF  3h 45mn  400/500   59.5kg  2/2 bath  AVF     Assessment/ Plan: 1. Symptomatic bradycardia: metoprolol on hold. HR stable 2. Orthostatic hypotension 4/10: limit UF today. Mentation improved, insulin pump on hold. Per primary 3. CAD/ hx atrial fib - eliquis on hold for anemia. NSR now.  On ranexa 4. Anemia - +FOB, Hb 8.3. EGD negative and colonscopy showed diffuse old blood but no active bleeding. VCE overall did not show any abnormalities 5. ESRD: maintaining MWF schedule  here 6. Volume/ hypertension: agree with holding home anti-HTNs given prob #2 7. Anemia ckd: receiving mircera 553m q 4wks outpatient. darbe IV 605mweekly on wed. Hold weekly iron given transfusion prn.  8. MBD ckd: Sensipar 15m63m/ dialysis. Phos 3- 5, resumed phoslo 1 ac tid.   9. Vascular access: AVF issues being evaluated outpt per patient   VikaGean Quint [place]

## 2020-12-23 NOTE — Progress Notes (Signed)
ANTICOAGULATION CONSULT NOTE - Follow-Up Consult  Pharmacy Consult for Heparin  Indication: atrial fibrillation   No Known Allergies  Patient Measurements: Height: '5\' 7"'$  (170.2 cm) (recorded 10/03/2019) Weight: 58.8 kg (129 lb 10.1 oz) IBW/kg (Calculated) : 66.1 Heparin Dosing Weight:  58.8 kg   Vital Signs: Temp: 98.7 F (37.1 C) (04/11 1745) Temp Source: Oral (04/11 1745) BP: 144/56 (04/11 1835) Pulse Rate: 70 (04/11 1835)  Labs: Recent Labs    12/21/20 0228 12/22/20 0111 12/23/20 0122 12/23/20 1858  HGB 8.6* 7.9* 7.8*  --   HCT 25.1* 23.9* 23.6*  --   PLT 151 150 153  --   APTT  --   --   --  46*  HEPARINUNFRC  --   --   --  <0.10*  CREATININE 5.32* 7.74* 9.58*  --     Estimated Creatinine Clearance: 6.6 mL/min (A) (by C-G formula based on SCr of 9.58 mg/dL (H)).   Medical History: Past Medical History:  Diagnosis Date  . Acute pulmonary edema ([place]) 05/31/2016  . Acute respiratory failure with hypoxemia ([place]) 05/31/2016  . CAD (coronary artery disease) 10/30/2016   NSTEMI 9/17 with CABG x 3 (LIMA-LAD, SVG-OM, SVG-RCA).   - NSTEMI 10/18 s/p DES to ostial SVG to  OM  . Depression   . Diabetic foot ulcer ([place]) 04/11/2017  . 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 ESRD (end stage renal disease) on dialysis [facility])    "MWF; [place]" (03/30/2018)  . Gastroesophageal reflux disease 08/18/2016  . HCAP (healthcare-associated pneumonia)   . Hemodialysis status ([place])   . Hyperlipidemia   . Hypertension   . Hypertensive heart disease 09/12/2017  . Ischemic rest pain of lower extremity 02/06/2015  . 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 NSTEMI (non-ST elevated myocardial infarction) ([place])   . PAF (paroxysmal atrial fibrillation) ([place])   . Pleural effusion   . Pronation deformity of both feet 02/27/2015    Medications:  Medications Prior to Admission  Medication Sig Dispense Refill Last Dose  . acetaminophen (TYLENOL) 500 MG tablet  Take 1,000 mg by mouth every 6 (six) hours as needed for mild pain or headache.    Past Week at Unknown time  . amLODipine (NORVASC) 10 MG tablet Take 10 mg by mouth at bedtime.    12/14/2020  . Ascorbic Acid (VITAMIN C) 1000 MG tablet Take 1,000 mg by mouth daily.   12/15/2020 at Unknown time  . atorvastatin (LIPITOR) 80 MG tablet Take 1 tablet by mouth once daily (Patient taking differently: Take 80 mg by mouth daily before lunch.) 90 tablet 3 12/15/2020 at Unknown time  . calcium acetate (PHOSLO) 667 MG capsule Take 667 mg by mouth 3 (three) times daily with meals.    12/15/2020 at Unknown time  . citalopram (CELEXA) 20 MG tablet Take 20 mg by mouth daily with lunch.   1 12/15/2020 at Unknown time  . ELIQUIS 5 MG TABS tablet Take 1 tablet by mouth twice daily 180 tablet 3 12/15/2020 at AM  . ezetimibe (ZETIA) 10 MG tablet Take 1 tablet by mouth once daily 90 tablet 3 12/15/2020 at Unknown time  . gabapentin (NEURONTIN) 100 MG capsule Take 100 mg by mouth daily with lunch.   0 12/15/2020 at Unknown time  . hydrALAZINE (APRESOLINE) 25 MG tablet TAKE 1 TABLET BY MOUTH EVERY 8 HOURS (Patient taking differently: Take 25 mg by mouth every 8 (eight) hours.) 270 tablet 0 12/15/2020 at  Unknown time  . Insulin Human (INSULIN PUMP) SOLN Inject into the skin continuous. NOVOLOG     . isosorbide mononitrate (IMDUR) 60 MG 24 hr tablet Take 1 tablet by mouth once daily 90 tablet 3 12/15/2020 at Unknown time  . losartan (COZAAR) 100 MG tablet Take 100 mg by mouth at bedtime.    12/14/2020  . NOVOLOG 100 UNIT/ML injection Inject 6-10 Units into the skin See admin instructions. Starts with 10 units per day and adds 6 units with each meal via pump   12/15/2020 at Unknown time  . pantoprazole (PROTONIX) 40 MG tablet Take 40 mg by mouth daily before lunch.   0 12/15/2020 at Unknown time    Assessment: 65 y.o male on apixaban prior to admission for h/o atrial fibrillation. Last dose taken on 12/15/20 AM.  This admission found to have GI bleed,  bradycardia, and chest tightness with tropon peak of 735 on 12/17/20.   Also has anemia of CKD / ESRD - on HD.  Transfused pRBCs on 12/17/20.  S/p colonoscopy on 12/20/20 showed blood in the entire examined colon; 1 sigmoid colon polyp was removed; Nonbleeding external and internal hemorrhoids.  No obvious source of bleeding on small bowel video capsule study.   Today Hgb 7.8, pltc stable at 153K.   Cardiologist has ordered a trial of anticoagulation with IV heparin infusion starting today 4/11 and if stable, plans to transition back to [place] on  4/12 and no secondary agent at discharge.   Heparin level subtherapeutic on 700 units/hr.  Goal of Therapy:  Heparin level 0.3-0.5 units/ml Monitor platelets by anticoagulation protocol: Yes   Plan:  Increase Heparin to 850 units/hr (no bolus) Check 8 hour heparin level Daily Heparin level and CBC Monitor for signs and symptoms of bleeding  [employer], Pharm.D., BCPS Clinical Pharmacist  **Pharmacist phone directory can be found on [email or web address] listed under [place].  12/23/2020 8:27 PM

## 2020-12-23 NOTE — Plan of Care (Signed)

## 2020-12-24 DIAGNOSIS — D649 Anemia, unspecified: Secondary | ICD-10-CM | POA: Diagnosis not present

## 2020-12-24 DIAGNOSIS — N186 End stage renal disease: Secondary | ICD-10-CM | POA: Diagnosis not present

## 2020-12-24 DIAGNOSIS — R001 Bradycardia, unspecified: Secondary | ICD-10-CM | POA: Diagnosis not present

## 2020-12-24 DIAGNOSIS — I480 Paroxysmal atrial fibrillation: Secondary | ICD-10-CM | POA: Diagnosis not present

## 2020-12-24 LAB — RENAL FUNCTION PANEL
Albumin: 3.1 g/dL — ABNORMAL LOW (ref 3.5–5.0)
Anion gap: 8 (ref 5–15)
BUN: 17 mg/dL (ref 8–23)
CO2: 30 mmol/L (ref 22–32)
Calcium: 7.3 mg/dL — ABNORMAL LOW (ref 8.9–10.3)
Chloride: 97 mmol/L — ABNORMAL LOW (ref 98–111)
Creatinine, Ser: 5.98 mg/dL — ABNORMAL HIGH (ref 0.61–1.24)
GFR, Estimated: 10 mL/min — ABNORMAL LOW (ref >60)
Glucose, Bld: 262 mg/dL — ABNORMAL HIGH (ref 70–99)
Phosphorus: 3.7 mg/dL (ref 2.5–4.6)
Potassium: 3.9 mmol/L (ref 3.5–5.1)
Sodium: 135 mmol/L (ref 135–145)

## 2020-12-24 LAB — CBC WITH DIFFERENTIAL/PLATELET
Abs Immature Granulocytes: 0.01 10*3/uL (ref 0.00–0.07)
Basophils Absolute: 0 10*3/uL (ref 0.0–0.1)
Basophils Relative: 1 %
Eosinophils Absolute: 0.2 10*3/uL (ref 0.0–0.5)
Eosinophils Relative: 6 %
HCT: 25.5 % — ABNORMAL LOW (ref 39.0–52.0)
Hemoglobin: 8.3 g/dL — ABNORMAL LOW (ref 13.0–17.0)
Immature Granulocytes: 0 %
Lymphocytes Relative: 21 %
Lymphs Abs: 0.8 10*3/uL (ref 0.7–4.0)
MCH: 32.7 pg (ref 26.0–34.0)
MCHC: 32.5 g/dL (ref 30.0–36.0)
MCV: 100.4 fL — ABNORMAL HIGH (ref 80.0–100.0)
Monocytes Absolute: 0.5 10*3/uL (ref 0.1–1.0)
Monocytes Relative: 12 %
Neutro Abs: 2.4 10*3/uL (ref 1.7–7.7)
Neutrophils Relative %: 60 %
Platelets: 163 10*3/uL (ref 150–400)
RBC: 2.54 MIL/uL — ABNORMAL LOW (ref 4.22–5.81)
RDW: 15.4 % (ref 11.5–15.5)
WBC: 3.9 10*3/uL — ABNORMAL LOW (ref 4.0–10.5)
nRBC: 0 % (ref 0.0–0.2)

## 2020-12-24 LAB — GLUCOSE, CAPILLARY
Glucose-Capillary: 177 mg/dL — ABNORMAL HIGH (ref 70–99)
Glucose-Capillary: 178 mg/dL — ABNORMAL HIGH (ref 70–99)
Glucose-Capillary: 207 mg/dL — ABNORMAL HIGH (ref 70–99)
Glucose-Capillary: 246 mg/dL — ABNORMAL HIGH (ref 70–99)
Glucose-Capillary: 298 mg/dL — ABNORMAL HIGH (ref 70–99)
Glucose-Capillary: 98 mg/dL (ref 70–99)

## 2020-12-24 LAB — HEPARIN LEVEL (UNFRACTIONATED): Heparin Unfractionated: 0.8 IU/mL — ABNORMAL HIGH (ref 0.30–0.70)

## 2020-12-24 MED ORDER — APIXABAN 5 MG PO TABS
5.0000 mg | ORAL_TABLET | Freq: Two times a day (BID) | ORAL | Status: DC
  Administered 2020-12-24 – 2020-12-25 (×3): 5 mg via ORAL
  Filled 2020-12-24 (×3): qty 1

## 2020-12-24 NOTE — Plan of Care (Signed)

## 2020-12-24 NOTE — Progress Notes (Signed)
[facility] Progress Note  Subjective: Seen and examined bedside.  Tolerated HD yesterday, net UF 2L. Did not have any dizzy spells afterwards. He reports that he feels okay. No acute events.  Vitals:   12/23/20 1835 12/23/20 2100 12/24/20 0345 12/24/20 0416  BP: (!) 144/56 (!) 119/58 (!) 131/52   Pulse: 70 64 68   Resp: '16 16 16   '$ Temp:  98.9 F (37.2 C) 98.1 F (36.7 C)   TempSrc:   Oral   SpO2: 99% 100% 100%   Weight:    63.5 kg  Height:        Exam: General: NAD CV: S1-S2 Resp: CTA BL Abdomen: Soft, nontender/nondistended Ext: no edema bilateral lower extremities Neuro: Awake, alert, moves all extremities spontaneously Access: aVF +b/t    Recent Labs  Lab 12/23/20 0122 12/24/20 0414  K 4.4 3.9  BUN 39* 17  CREATININE 9.58* 5.98*  CALCIUM 7.6* 7.3*  PHOS 4.5 3.7  HGB 7.8* 8.3*   Inpatient medications: . atorvastatin  80 mg Oral Daily  . calcium acetate  667 mg Oral TID WC  . Chlorhexidine Gluconate Cloth  6 each Topical Q0600  . cinacalcet  60 mg Oral Q M,W,F  . [START ON 01/01/2021] darbepoetin (ARANESP) injection - DIALYSIS  60 mcg Intravenous Q Wed-HD  . ezetimibe  10 mg Oral Daily  . insulin aspart  0-9 Units Subcutaneous Q4H  . ranolazine  500 mg Oral BID   . sodium chloride    . heparin 750 Units/hr (12/24/20 0644)  . sodium chloride     acetaminophen, sodium chloride     OP HD: MWF AF  3h 104mn  400/500   59.5kg  2/2 bath  AVF     Assessment/ Plan: 1. Symptomatic bradycardia: metoprolol on hold. HR stable 2. Orthostatic hypotension 4/10:  Mentation improved, insulin pump on hold. Per primary. Consider repeating orthostatic vitals 3. CAD/ hx atrial fib - eliquis on hold for anemia. NSR now.  On ranexa 4. Blood loss anemia - +FOB, Hb 8.3. EGD negative and colonscopy showed diffuse old blood but no active bleeding. VCE overall did not show any abnormalities, gi signed off 5. ESRD: maintaining MWF schedule here 6. Volume/  hypertension: agree with holding home anti-HTNs given prob #2 7. Anemia ckd: receiving mircera 515m q 4wks outpatient. darbe IV 6036mweekly on wed. Hold weekly iron given transfusion prn.  8. Secondary hyperaparathyroidism/MBD ckd: Sensipar 50m28m/ dialysis. Phos 3- 5, resumed phoslo 1 ac tid.   9. Vascular access: AVF issues being evaluated outpt per patient   VikaGean Quint [place]

## 2020-12-24 NOTE — Progress Notes (Signed)
ANTICOAGULATION CONSULT NOTE - Follow-Up Consult  Pharmacy Consult for Heparin  Indication: atrial fibrillation   No Known Allergies  Patient Measurements: Height: '5\' 7"'$  (170.2 cm) (recorded 10/03/2019) Weight: 63.5 kg (139 lb 15.9 oz) IBW/kg (Calculated) : 66.1 Heparin Dosing Weight:  58.8 kg   Vital Signs: Temp: 98.1 F (36.7 C) (04/12 0345) Temp Source: Oral (04/12 0345) BP: 131/52 (04/12 0345) Pulse Rate: 68 (04/12 0345)  Labs: Recent Labs    12/22/20 0111 12/23/20 0122 12/23/20 1858 12/24/20 0414  HGB 7.9* 7.8*  --  8.3*  HCT 23.9* 23.6*  --  25.5*  PLT 150 153  --  163  APTT  --   --  46*  --   HEPARINUNFRC  --   --  <0.10* 0.80*  CREATININE 7.74* 9.58*  --  5.98*    Estimated Creatinine Clearance: 11.4 mL/min (A) (by C-G formula based on SCr of 5.98 mg/dL (H)).   Medical History: Past Medical History:  Diagnosis Date  . Acute pulmonary edema ([place]) 05/31/2016  . Acute respiratory failure with hypoxemia ([place]) 05/31/2016  . CAD (coronary artery disease) 10/30/2016   NSTEMI 9/17 with CABG x 3 (LIMA-LAD, SVG-OM, SVG-RCA).   - NSTEMI 10/18 s/p DES to ostial SVG to  OM  . Depression   . Diabetic foot ulcer ([place]) 04/11/2017  . 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 ESRD (end stage renal disease) on dialysis [facility])    "MWF; [place]" (03/30/2018)  . Gastroesophageal reflux disease 08/18/2016  . HCAP (healthcare-associated pneumonia)   . Hemodialysis status ([place])   . Hyperlipidemia   . Hypertension   . Hypertensive heart disease 09/12/2017  . Ischemic rest pain of lower extremity 02/06/2015  . 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 NSTEMI (non-ST elevated myocardial infarction) ([place])   . PAF (paroxysmal atrial fibrillation) ([place])   . Pleural effusion   . Pronation deformity of both feet 02/27/2015    Medications:  Medications Prior to Admission  Medication Sig Dispense Refill Last Dose  . acetaminophen (TYLENOL) 500 MG  tablet Take 1,000 mg by mouth every 6 (six) hours as needed for mild pain or headache.    Past Week at Unknown time  . amLODipine (NORVASC) 10 MG tablet Take 10 mg by mouth at bedtime.    12/14/2020  . Ascorbic Acid (VITAMIN C) 1000 MG tablet Take 1,000 mg by mouth daily.   12/15/2020 at Unknown time  . atorvastatin (LIPITOR) 80 MG tablet Take 1 tablet by mouth once daily (Patient taking differently: Take 80 mg by mouth daily before lunch.) 90 tablet 3 12/15/2020 at Unknown time  . calcium acetate (PHOSLO) 667 MG capsule Take 667 mg by mouth 3 (three) times daily with meals.    12/15/2020 at Unknown time  . citalopram (CELEXA) 20 MG tablet Take 20 mg by mouth daily with lunch.   1 12/15/2020 at Unknown time  . ELIQUIS 5 MG TABS tablet Take 1 tablet by mouth twice daily 180 tablet 3 12/15/2020 at AM  . ezetimibe (ZETIA) 10 MG tablet Take 1 tablet by mouth once daily 90 tablet 3 12/15/2020 at Unknown time  . gabapentin (NEURONTIN) 100 MG capsule Take 100 mg by mouth daily with lunch.   0 12/15/2020 at Unknown time  . hydrALAZINE (APRESOLINE) 25 MG tablet TAKE 1 TABLET BY MOUTH EVERY 8 HOURS (Patient taking differently: Take 25 mg by mouth every 8 (eight) hours.) 270 tablet 0 12/15/2020 at Unknown time  .  Insulin Human (INSULIN PUMP) SOLN Inject into the skin continuous. NOVOLOG     . isosorbide mononitrate (IMDUR) 60 MG 24 hr tablet Take 1 tablet by mouth once daily 90 tablet 3 12/15/2020 at Unknown time  . losartan (COZAAR) 100 MG tablet Take 100 mg by mouth at bedtime.    12/14/2020  . NOVOLOG 100 UNIT/ML injection Inject 6-10 Units into the skin See admin instructions. Starts with 10 units per day and adds 6 units with each meal via pump   12/15/2020 at Unknown time  . pantoprazole (PROTONIX) 40 MG tablet Take 40 mg by mouth daily before lunch.   0 12/15/2020 at Unknown time    Assessment: 65 y.o male on apixaban prior to admission for h/o atrial fibrillation. Last dose taken on 12/15/20 AM.  This admission found to have GI  bleed, bradycardia, and chest tightness with tropon peak of 735 on 12/17/20.   Also has anemia of CKD / ESRD - on HD.  Transfused pRBCs on 12/17/20.  S/p colonoscopy on 12/20/20 showed blood in the entire examined colon; 1 sigmoid colon polyp was removed; Nonbleeding external and internal hemorrhoids.  No obvious source of bleeding on small bowel video capsule study.   Today Hgb 7.8, pltc stable at 153K.   Cardiologist has ordered a trial of anticoagulation with IV heparin infusion starting today 4/11 and if stable, plans to transition back to [place] on  4/12 and no secondary agent at discharge.   4/12 AM update:  Heparin level just above goal  Goal of Therapy:  Heparin level 0.3-0.5 units/ml Monitor platelets by anticoagulation protocol: Yes   Plan:  Dec heparin to 750 units/hr 1400 heparin level  Narda Bonds, PharmD, BCPS Clinical Pharmacist Phone: [phone]

## 2020-12-24 NOTE — Progress Notes (Signed)
Patient ID: Kenneth [name], male   DOB: Jul 22, 1956, 65 y.o.   MRN: [id number]  PROGRESS NOTE    Kenneth [name]  [name] DOB: 1955-10-25 DOA: 12/15/2020 PCP: [name], MD   Brief Narrative:  65 y.o.malewith medical history significant of HLD, HTN, DM1, A.fib, ESRD on HD, CAD sp CABG presented with CP tightness and weakness along with intermittent bloody stools.  On EMS arrival, patient was bradycardic into the 40s and poorly responsive for which she was given 1 mg of atropine and heart rate went up to 40 to 60s.  In the ED, potassium was 6.2, EKG showed junctional rhythm; he was given Lokelma, glucose and insulin.  Nephrology is consulted and he subsequently had HD.  He also was found to have hemoglobin drop of 2 units with Hemoccult positive stool.  Status post EGD on 12/23/2020 and colonoscopy on 12/20/2020.  Assessment & Plan:  Acute encephalopathy -On the afternoon of 12/22/2020, patient became slightly dizzy while walking with drop in blood pressure which improved after laying down in bed.  Subsequently, patient had a brief.  With decreased level of consciousness along with bradypnea with slight confusion after that.  He was given small amount of IV fluid bolus and antihypertensives were held.  Chest x-ray and ABG were unremarkable.  CT of the head did not show signs of acute intracranial abnormality.  Insulin pump was discontinued. -Mental status improved since then.  Monitor   Symptomatic bradycardia -Status post atropine on presentation.  Heart rate in the 60s to 70s currently.  Beta-blocker on hold. -EKG showed no acute ST changes -Echo in 3/22 showed EF of 55 to 60% with grade 2 diastolic dysfunction -Cardiology following  Possible non-STEMI versus demand ischemia in a patient with history of CAD status post CABG Hyperlipidemia -Troponin peaked up to more than 700.  EKG with no acute ST changes.  Cardiology following and no planning on conservative management.  Continue statin  and Zetia.  Imdur, losartan and metoprolol on hold.    GI bleeding Anemia of chronic kidney disease -Baseline hemoglobin around 9-10.  Hemoglobin dropped to 6.7.  Status post 1 unit packed red cell transfusion on 12/17/2020.  Hemoglobin 8.3 today -eliquis on hold.   -Status post EGD on 12/19/2020 which showed erythematous mucosa in stomach which was biopsied and erythematous to adenopathy.   -Status post colonoscopy on 12/20/2020 which showed blood in the entire examined colon; 1 sigmoid colon polyp was removed;Nonbleeding external and internal hemorrhoids.  No obvious source of bleeding on small bowel video capsule study.  GI signed off on 12/23/2020  End-stage renal disease on hemodialysis Hyperkalemia: Resolved -Nephrology following.  Dialysis as per nephrology schedule  Chronic diastolic heart failure -Volume management as per hemodialysis  Diabetes mellitus type 1 with hypoglycemia -A1c 5.9.  Insulin pump plan as above.  Will do CBGs for now.  Blood sugars intermittently on the lower side.  Paroxysmal A. Fib -Currently rate controlled.  Metoprolol and Eliquis on hold.  Heparin drip was started by cardiology on 12/23/2020.  Hypertension -Antihypertensives held on 12/22/2020.  Monitor.  Generalized weakness--PT/OT eval   DVT prophylaxis: Heparin drip Code Status: Full Family Communication: None at bedside Disposition Plan: Status is: Inpatient  Remains inpatient appropriate because:Inpatient level of care appropriate due to severity of illness   Dispo: The patient is from: Home              Anticipated d/c is to: Home  Patient currently is not medically stable to d/c.   Difficult to place patient No  Consultants: Nephrology/GI/cardiology  Procedures:  EGD on 12/19/2020 Impression:               - Normal esophagus.                           - Erythematous mucosa in the stomach. Biopsied.                           - Erythematous duodenopathy.                            - Duodenal deformity. Recommendation:           - Return patient to hospital ward for ongoing care.                           - The signs and symptoms of potential delayed                            complications were discussed with the patient.                           - Clear liquid diet today.                           - Continue present medications.                           - Continue to hold Eliquis.                           - Await pathology results.                           - Perform a colonoscopy tomorrow.  Colonoscopy on 12/20/2020 Impression:               - Blood in the entire examined colon. No obvious                            source of lower GI bleed after extensive lavage and                            suction                           - One less than 1 mm polyp in the sigmoid colon,                            removed with a cold biopsy forceps. Resected and                            retrieved.                           - Non-bleeding external and internal hemorrhoids. Recommendation:           -  Patient has a contact number available for                            emergencies. The signs and symptoms of potential                            delayed complications were discussed with the                            patient. Return to normal activities tomorrow.                            Written discharge instructions were provided to the                            patient.                           - Plan to proceed with small bowel video capsule                            study to exclude small bowel GI bleed/AVM                           - NPO for 2 hours. Followed by Clear liquid for 6                            hours, then resume previous diet                           - Continue present medications.                           - Await pathology results.                           - Repeat colonoscopy in 5-10 years, date to be                            determined  after pending pathology results are                            reviewed for surveillance based on pathology                            results.  Small bowel video capsule study  Antimicrobials: None   Subjective: Patient seen and examined at bedside.  No overnight fever, vomiting, chest pain reported. Objective: Vitals:   12/23/20 1835 12/23/20 2100 12/24/20 0345 12/24/20 0416  BP: (!) 144/56 (!) 119/58 (!) 131/52   Pulse: 70 64 68   Resp: '16 16 16   '$ Temp:  98.9 F (37.2 C) 98.1 F (36.7 C)   TempSrc:   Oral   SpO2: 99% 100% 100%   Weight:    63.5 kg  Height:        Intake/Output Summary (Last 24  hours) at 12/24/2020 0756 Last data filed at 12/24/2020 0644 Gross per 24 hour  Intake 148.57 ml  Output 2000 ml  Net -1851.43 ml   Filed Weights   12/23/20 1350 12/23/20 1745 12/24/20 0416  Weight: 61.1 kg 58.8 kg 63.5 kg    Examination:  General exam: On room air currently.  No distress.  Answers some questions appropriately. respiratory system: Bilateral decreased breath sounds at bases with scattered crackles, no wheezing  cardiovascular system: S1-S2 heard, rate controlled Gastrointestinal system: Abdomen is mildly distended, soft and nontender.  Normal bowel sounds heard  extremities: No cyanosis; bilateral lower extremity trace edema present  Data Reviewed: I have personally reviewed following labs and imaging studies  CBC: Recent Labs  Lab 12/20/20 0309 12/21/20 0228 12/22/20 0111 12/23/20 0122 12/24/20 0414  WBC 3.7* 3.8* 4.6 4.8 3.9*  NEUTROABS 2.3 2.1 2.4 3.0 2.4  HGB 8.6* 8.6* 7.9* 7.8* 8.3*  HCT 25.6* 25.1* 23.9* 23.6* 25.5*  MCV 97.3 97.7 99.6 99.2 100.4*  PLT 122* 151 150 153 [id number]   Basic Metabolic Panel: Recent Labs  Lab 12/20/20 0309 12/21/20 0228 12/22/20 0111 12/23/20 0122 12/24/20 0414  NA 139 137 134* 132* 135  K 4.3 4.2 4.0 4.4 3.9  CL 103 98 99 97* 97*  CO2 '24 31 27 26 30  '$ GLUCOSE 87 200* 143* 218* 262*  BUN 35* 15 31* 39* 17   CREATININE 7.95* 5.32* 7.74* 9.58* 5.98*  CALCIUM 8.5* 7.9* 7.8* 7.6* 7.3*  PHOS 4.2 4.1 4.1 4.5 3.7   GFR: Estimated Creatinine Clearance: 11.4 mL/min (A) (by C-G formula based on SCr of 5.98 mg/dL (H)). Liver Function Tests: Recent Labs  Lab 12/20/20 0309 12/21/20 0228 12/22/20 0111 12/23/20 0122 12/24/20 0414  ALBUMIN 3.4* 3.3* 3.3* 3.0* 3.1*   No results for input(s): LIPASE, AMYLASE in the last 168 hours. No results for input(s): AMMONIA in the last 168 hours. Coagulation Profile: No results for input(s): INR, PROTIME in the last 168 hours. Cardiac Enzymes: No results for input(s): CKTOTAL, CKMB, CKMBINDEX, TROPONINI in the last 168 hours. BNP (last 3 results) No results for input(s): PROBNP in the last 8760 hours. HbA1C: No results for input(s): HGBA1C in the last 72 hours. CBG: Recent Labs  Lab 12/23/20 1826 12/23/20 2058 12/23/20 2339 12/24/20 0343 12/24/20 0741  GLUCAP 100* 196* 203* 246* 98   Lipid Profile: No results for input(s): CHOL, HDL, LDLCALC, TRIG, CHOLHDL, LDLDIRECT in the last 72 hours. Thyroid Function Tests: No results for input(s): TSH, T4TOTAL, FREET4, T3FREE, THYROIDAB in the last 72 hours. Anemia Panel: No results for input(s): VITAMINB12, FOLATE, FERRITIN, TIBC, IRON, RETICCTPCT in the last 72 hours. Sepsis Labs: No results for input(s): PROCALCITON, LATICACIDVEN in the last 168 hours.  Recent Results (from the past 240 hour(s))  Resp Panel by RT-PCR (Flu A&B, Covid) Nasopharyngeal Swab     Status: None   Collection Time: 12/15/20  8:57 PM   Specimen: Nasopharyngeal Swab; Nasopharyngeal(NP) swabs in vial transport medium  Result Value Ref Range Status   SARS Coronavirus 2 by RT PCR NEGATIVE NEGATIVE Final    Comment: (NOTE) SARS-CoV-2 target nucleic acids are NOT DETECTED.  The SARS-CoV-2 RNA is generally detectable in upper respiratory specimens during the acute phase of infection. The lowest concentration of SARS-CoV-2 viral  copies this assay can detect is 138 copies/mL. A negative result does not preclude SARS-Cov-2 infection and should not be used as the sole basis for treatment or other patient management decisions. A negative result  may occur with  improper specimen collection/handling, submission of specimen other than nasopharyngeal swab, presence of viral mutation(s) within the areas targeted by this assay, and inadequate number of viral copies(<138 copies/mL). A negative result must be combined with clinical observations, patient history, and epidemiological information. The expected result is Negative.  Fact Sheet for Patients:  EntrepreneurPulse.com.au  Fact Sheet for Healthcare Providers:  IncredibleEmployment.be  This test is no t yet approved or cleared by the [place] FDA and  has been authorized for detection and/or diagnosis of SARS-CoV-2 by FDA under an Emergency Use Authorization (EUA). This EUA will remain  in effect (meaning this test can be used) for the duration of the COVID-19 declaration under Section 564(b)(1) of the Act, 21 U.S.C.section 360bbb-3(b)(1), unless the authorization is terminated  or revoked sooner.       Influenza A by PCR NEGATIVE NEGATIVE Final   Influenza B by PCR NEGATIVE NEGATIVE Final    Comment: (NOTE) The Xpert Xpress SARS-CoV-2/FLU/RSV plus assay is intended as an aid in the diagnosis of influenza from Nasopharyngeal swab specimens and should not be used as a sole basis for treatment. Nasal washings and aspirates are unacceptable for Xpert Xpress SARS-CoV-2/FLU/RSV testing.  Fact Sheet for Patients: EntrepreneurPulse.com.au  Fact Sheet for Healthcare Providers: IncredibleEmployment.be  This test is not yet approved or cleared by the [place] FDA and has been authorized for detection and/or diagnosis of SARS-CoV-2 by FDA under an Emergency Use Authorization (EUA). This  EUA will remain in effect (meaning this test can be used) for the duration of the COVID-19 declaration under Section 564(b)(1) of the Act, 21 U.S.C. section 360bbb-3(b)(1), unless the authorization is terminated or revoked.  Performed at [facility], [place] [address], [place], [place] [address]          Radiology Studies: CT HEAD WO CONTRAST  Result Date: 12/22/2020 CLINICAL DATA:  Encephalopathy EXAM: CT HEAD WITHOUT CONTRAST TECHNIQUE: Contiguous axial images were obtained from the base of the skull through the vertex without intravenous contrast. COMPARISON:  08/27/2019 FINDINGS: Brain: There is no mass, hemorrhage or extra-axial collection. The size and configuration of the ventricles and extra-axial CSF spaces are normal. There is hypoattenuation of the white matter, most commonly indicating chronic small vessel disease. Vascular: No abnormal hyperdensity of the major intracranial arteries or dural venous sinuses. No intracranial atherosclerosis. Skull: The visualized skull base, calvarium and extracranial soft tissues are normal. Sinuses/Orbits: No fluid levels or advanced mucosal thickening of the visualized paranasal sinuses. No mastoid or middle ear effusion. The orbits are normal. IMPRESSION: Chronic small vessel disease without acute intracranial abnormality. Electronically Signed   By: Ulyses Jarred M.D.   On: 12/22/2020 19:21   DG Chest Port 1 View  Result Date: 12/22/2020 CLINICAL DATA:  Dyspnea. EXAM: PORTABLE CHEST 1 VIEW COMPARISON:  12/15/2020 FINDINGS: Stable enlarged cardiac silhouette and post CABG changes. Stable small amount of linear scarring or atelectasis in the left lower lung zone. The remainder of the lungs remain clear. Left subclavian stent. Unremarkable bones. IMPRESSION: No acute abnormality. Stable cardiomegaly. Electronically Signed   By: Claudie Revering M.D.   On: 12/22/2020 17:41        Scheduled Meds: . atorvastatin  80 mg Oral Daily  . calcium  acetate  667 mg Oral TID WC  . Chlorhexidine Gluconate Cloth  6 each Topical Q0600  . cinacalcet  60 mg Oral Q M,W,F  . [START ON 01/01/2021] darbepoetin (ARANESP) injection - DIALYSIS  60 mcg  Intravenous Q Wed-HD  . ezetimibe  10 mg Oral Daily  . insulin aspart  0-9 Units Subcutaneous Q4H  . ranolazine  500 mg Oral BID   Continuous Infusions: . sodium chloride    . heparin 750 Units/hr (12/24/20 0644)  . sodium chloride            Aline August, MD [facility] 12/24/2020, 7:56 AM

## 2020-12-24 NOTE — Progress Notes (Signed)
Progress Note  Patient Name: Kenneth [name] Date of Encounter: 12/24/2020  Primary Cardiologist: Loralie Champagne MD  Subjective   No events overnight.  No Hgb Drop.  Patient notes no further bleeding.  Excited to go home.  No further CP.  Inpatient Medications    Scheduled Meds:  atorvastatin  80 mg Oral Daily   calcium acetate  667 mg Oral TID WC   Chlorhexidine Gluconate Cloth  6 each Topical Q0600   cinacalcet  60 mg Oral Q M,W,F   [START ON 01/01/2021] darbepoetin (ARANESP) injection - DIALYSIS  60 mcg Intravenous Q Wed-HD   ezetimibe  10 mg Oral Daily   insulin aspart  0-9 Units Subcutaneous Q4H   ranolazine  500 mg Oral BID   Continuous Infusions:  sodium chloride     sodium chloride     PRN Meds: acetaminophen, sodium chloride   Vital Signs    Vitals:   12/23/20 1835 12/23/20 2100 12/24/20 0345 12/24/20 0416  BP: (!) 144/56 (!) 119/58 (!) 131/52   Pulse: 70 64 68   Resp: '16 16 16   '$ Temp:  98.9 F (37.2 C) 98.1 F (36.7 C)   TempSrc:   Oral   SpO2: 99% 100% 100%   Weight:    63.5 kg  Height:        Intake/Output Summary (Last 24 hours) at 12/24/2020 0927 Last data filed at 12/24/2020 0644 Gross per 24 hour  Intake 148.57 ml  Output 2000 ml  Net -1851.43 ml   Filed Weights   12/23/20 1350 12/23/20 1745 12/24/20 0416  Weight: 61.1 kg 58.8 kg 63.5 kg    Telemetry    SR - Personally Reviewed  ECG    No new last 24 - Personally Reviewed  Physical Exam   GEN: No acute distress.   Neck: No JVD Cardiac: RRR, no rubs, or gallops; Continuous murmur in all fields but best in LUSB associated with L arm bruit with good thrill (no change from 12/23/20) Respiratory: Clear to auscultation bilaterally. GI: Soft, nontender, non-distended  MS: No edema; No deformity. Neuro:  Nonfocal  Psych: Normal affect   Labs    Chemistry Recent Labs  Lab 12/22/20 0111 12/23/20 0122 12/24/20 0414  NA 134* 132* 135  K 4.0 4.4 3.9  CL 99 97* 97*  CO2 '27 26 30   '$ GLUCOSE 143* 218* 262*  BUN 31* 39* 17  CREATININE 7.74* 9.58* 5.98*  CALCIUM 7.8* 7.6* 7.3*  ALBUMIN 3.3* 3.0* 3.1*  GFRNONAA 7* 6* 10*  ANIONGAP '8 9 8     '$ Hematology Recent Labs  Lab 12/22/20 0111 12/23/20 0122 12/24/20 0414  WBC 4.6 4.8 3.9*  RBC 2.40* 2.38* 2.54*  HGB 7.9* 7.8* 8.3*  HCT 23.9* 23.6* 25.5*  MCV 99.6 99.2 100.4*  MCH 32.9 32.8 32.7  MCHC 33.1 33.1 32.5  RDW 14.5 14.9 15.4  PLT 150 153 163    Cardiac EnzymesNo results for input(s): TROPONINI in the last 168 hours. No results for input(s): TROPIPOC in the last 168 hours.   BNPNo results for input(s): BNP, PROBNP in the last 168 hours.   DDimer No results for input(s): DDIMER in the last 168 hours.   Radiology    CT HEAD WO CONTRAST  Result Date: 12/22/2020 CLINICAL DATA:  Encephalopathy EXAM: CT HEAD WITHOUT CONTRAST TECHNIQUE: Contiguous axial images were obtained from the base of the skull through the vertex without intravenous contrast. COMPARISON:  08/27/2019 FINDINGS: Brain: There is no mass,  hemorrhage or extra-axial collection. The size and configuration of the ventricles and extra-axial CSF spaces are normal. There is hypoattenuation of the white matter, most commonly indicating chronic small vessel disease. Vascular: No abnormal hyperdensity of the major intracranial arteries or dural venous sinuses. No intracranial atherosclerosis. Skull: The visualized skull base, calvarium and extracranial soft tissues are normal. Sinuses/Orbits: No fluid levels or advanced mucosal thickening of the visualized paranasal sinuses. No mastoid or middle ear effusion. The orbits are normal. IMPRESSION: Chronic small vessel disease without acute intracranial abnormality. Electronically Signed   By: Ulyses Jarred M.D.   On: 12/22/2020 19:21   DG Chest Port 1 View  Result Date: 12/22/2020 CLINICAL DATA:  Dyspnea. EXAM: PORTABLE CHEST 1 VIEW COMPARISON:  12/15/2020 FINDINGS: Stable enlarged cardiac silhouette and post  CABG changes. Stable small amount of linear scarring or atelectasis in the left lower lung zone. The remainder of the lungs remain clear. Left subclavian stent. Unremarkable bones. IMPRESSION: No acute abnormality. Stable cardiomegaly. Electronically Signed   By: Claudie Revering M.D.   On: 12/22/2020 17:41    Cardiac Studies   TTE 11/28/20:   1. Left ventricular ejection fraction, by estimation, is 55 to 60%. The  left ventricle has normal function. The left ventricle has no regional  wall motion abnormalities. There is moderate left ventricular hypertrophy.  Left ventricular diastolic  parameters are consistent with Grade II diastolic dysfunction  (pseudonormalization). Elevated left atrial pressure.   2. Right ventricular systolic function is normal. The right ventricular  size is normal.   3. The mitral valve is normal in structure. Trivial mitral valve  regurgitation. No evidence of mitral stenosis.   4. The aortic valve is tricuspid. Aortic valve regurgitation is trivial.  Mild aortic valve sclerosis is present, with no evidence of aortic valve  stenosis.   5. The inferior vena cava is normal in size with greater than 50%  respiratory variability, suggesting right atrial pressure of 3 mmHg.    Cath 04/17/2019: 1. Occluded SVG-RCA and SVG-ramus, patent LIMA-LAD.  2. The large ramus was patent with 40-50% mid ramus stenosis (in-stent restenosis).  3. 80% ostial stenosis very small caliber AV LCx.  Not interventional target.  4. Occluded RCA (known from prior) with collaterals.    No target for intervention.  Aggressive medical management of angina.    Myoview 11/15/18: Baseline EKG showed NSR with LVH and repolarization abnormality in the inferolateral leads. No change in ST segments during Lexiscan infusion. Nuclear stress EF: 54%. The left ventricular ejection fraction is mildly decreased (45-54%). There is a small defect of mild severity present in the basal inferior and mid inferior  location. The defect is non-reversible and in the setting of normal LVF is consistent with diaphragmatic attenuation artifact. No ischemia noted. This is a low risk study.     Small bowel video capsule study 12/23/20 Small bowel video capsule study shows the capsule passing quickly into the duodenum.  There is fair visualization of the small bowel since there is retained fluid and debris.  Overall that is typical for video capsule study.  Relatively fast transit time about 2 hours and 25 minutes into the colon, at which time no fresh or old blood was seen in the right colon.  Little or no mucosal visualization of the colon is obtained on this study.  There was no fresh or old blood throughout the small bowel.  No mucosal abnormality such as AVM tumor or inflammation were visualized in the small bowel.  Colonoscopy 12/20/20: No source of bleed; polyp removed. Coffee ground vs melena seen through the colon.   EGD 12/19/20: moderate post-ulcer deformity in the duodenal bulb, erythematous mucosa in the stomach; no active source of bleeding  Patient Profile     66 y.o. male HTN with DM, HLD with DM, PAF CHASVASC 4 (query of HFpEF) HASBLED 4, CAD s/p CABG (LIMA-LAD, SVG-OM- occluded, SVG-RCA- occluded) and multiple PCI BID bleed with duodenal ulcer.  Found to gave GI bleed, bradycardia, and chest tightness with troponin peak of 745 12/17/20.  Assessment & Plan    NSTEMI PAF CHADSVASC 4, HASLBED 4 HTN with DM HLD with DM; hx of Carotid Stenosis Symptomatic Bradycardia ESRD HFpEF - asymptomatic presently - anatomy:  LIMA-LAD, SVG-OM- occluded, SVG-RCA- occluded with RI ISR and no clear targets in 2020 - see prior notes for discussions and repeat LHC; presently planned for medical management - stable Hgb- will transition to Percy today (ordered) - continue statin and zetia; at goal LDL < 70 - will hold AV nodal agents; will re-evaluate if symptomatic AF RVR occurs; outpatient given sub-optimal targets  EP follow up could be considered - Continue Imdur 60 mg - continue Ranexa 500 mg PO BID (at max dose with consideration to ESRD)  Potential 12/25/20 Sign off.  Will work to arrange follow up  For questions or updates, please contact [place] Please consult [email or web address] for contact info under Cardiology/STEMI.      Signed, Werner Lean, MD  12/24/2020, 9:27 AM

## 2020-12-24 NOTE — Progress Notes (Signed)
Heparin rate changed to 7.5 at 6.am

## 2020-12-24 NOTE — Progress Notes (Signed)
Inpatient Diabetes Program Recommendations  AACE/ADA: New Consensus Statement on Inpatient Glycemic Control   Target Ranges:  Prepandial:   less than 140 mg/dL      Peak postprandial:   less than 180 mg/dL (1-2 hours)      Critically ill patients:  140 - 180 mg/dL  Results for MAX, SWEENY (MRN [id number]) as of 12/24/2020 08:17  Ref. Range 12/23/2020 08:19 12/23/2020 11:40 12/23/2020 18:26 12/23/2020 20:58 12/23/2020 23:39 12/24/2020 03:43 12/24/2020 07:41  Glucose-Capillary Latest Ref Range: 70 - 99 mg/dL 122 (H)  Novolog 1 unit 111 (H) 100 (H) 196 (H)  Novolog 2 units 203 (H)  Novolog 3 units 246 (H)  Novolog 3 units 98    Review of Glycemic Control  Diabetes history: DM1 Outpatient Diabetes medications: Medtronic insulin pump Current orders for Inpatient glycemic control: Novolog 0-9 units Q4H  Inpatient Diabetes Program Recommendations:    Insulin: If CBGs and Novolog frequency are changed to AC&HS or glucose is consistently over 180 mg/dl,  please consider ordering Lantus 5 units Q24H .  Thanks, Barnie Alderman, RN, MSN, CDE Diabetes Coordinator Inpatient Diabetes Program [phone] (Team Pager from 8am to 5pm)

## 2020-12-24 NOTE — Progress Notes (Addendum)
ANTICOAGULATION CONSULT NOTE - [place] for Stop Heparin and resume Apixaban.  Indication: atrial fibrillation   No Known Allergies  Patient Measurements: Height: '5\' 7"'$  (170.2 cm) (recorded 10/03/2019) Weight: 63.5 kg (139 lb 15.9 oz) IBW/kg (Calculated) : 66.1 Heparin Dosing Weight:  58.8 kg   Vital Signs: Temp: 98.1 F (36.7 C) (04/12 0345) Temp Source: Oral (04/12 0345) BP: 131/52 (04/12 0345) Pulse Rate: 68 (04/12 0345)  Labs: Recent Labs    12/22/20 0111 12/23/20 0122 12/23/20 1858 12/24/20 0414  HGB 7.9* 7.8*  --  8.3*  HCT 23.9* 23.6*  --  25.5*  PLT 150 153  --  163  APTT  --   --  46*  --   HEPARINUNFRC  --   --  <0.10* 0.80*  CREATININE 7.74* 9.58*  --  5.98*    Estimated Creatinine Clearance: 11.4 mL/min (A) (by C-G formula based on SCr of 5.98 mg/dL (H)).   Medical History: Past Medical History:  Diagnosis Date  . Acute pulmonary edema ([place]) 05/31/2016  . Acute respiratory failure with hypoxemia ([place]) 05/31/2016  . CAD (coronary artery disease) 10/30/2016   NSTEMI 9/17 with CABG x 3 (LIMA-LAD, SVG-OM, SVG-RCA).   - NSTEMI 10/18 s/p DES to ostial SVG to  OM  . Depression   . Diabetic foot ulcer ([place]) 04/11/2017  . 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 ESRD (end stage renal disease) on dialysis [facility])    "MWF; [place]" (03/30/2018)  . Gastroesophageal reflux disease 08/18/2016  . HCAP (healthcare-associated pneumonia)   . Hemodialysis status ([place])   . Hyperlipidemia   . Hypertension   . Hypertensive heart disease 09/12/2017  . Ischemic rest pain of lower extremity 02/06/2015  . 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 NSTEMI (non-ST elevated myocardial infarction) ([place])   . PAF (paroxysmal atrial fibrillation) ([place])   . Pleural effusion   . Pronation deformity of both feet 02/27/2015    Medications:  Medications Prior to Admission  Medication Sig Dispense Refill Last Dose  .  acetaminophen (TYLENOL) 500 MG tablet Take 1,000 mg by mouth every 6 (six) hours as needed for mild pain or headache.    Past Week at Unknown time  . amLODipine (NORVASC) 10 MG tablet Take 10 mg by mouth at bedtime.    12/14/2020  . Ascorbic Acid (VITAMIN C) 1000 MG tablet Take 1,000 mg by mouth daily.   12/15/2020 at Unknown time  . atorvastatin (LIPITOR) 80 MG tablet Take 1 tablet by mouth once daily (Patient taking differently: Take 80 mg by mouth daily before lunch.) 90 tablet 3 12/15/2020 at Unknown time  . calcium acetate (PHOSLO) 667 MG capsule Take 667 mg by mouth 3 (three) times daily with meals.    12/15/2020 at Unknown time  . citalopram (CELEXA) 20 MG tablet Take 20 mg by mouth daily with lunch.   1 12/15/2020 at Unknown time  . ELIQUIS 5 MG TABS tablet Take 1 tablet by mouth twice daily 180 tablet 3 12/15/2020 at AM  . ezetimibe (ZETIA) 10 MG tablet Take 1 tablet by mouth once daily 90 tablet 3 12/15/2020 at Unknown time  . gabapentin (NEURONTIN) 100 MG capsule Take 100 mg by mouth daily with lunch.   0 12/15/2020 at Unknown time  . hydrALAZINE (APRESOLINE) 25 MG tablet TAKE 1 TABLET BY MOUTH EVERY 8 HOURS (Patient taking differently: Take 25 mg by mouth every 8 (eight) hours.) 270 tablet 0  12/15/2020 at Unknown time  . Insulin Human (INSULIN PUMP) SOLN Inject into the skin continuous. NOVOLOG     . isosorbide mononitrate (IMDUR) 60 MG 24 hr tablet Take 1 tablet by mouth once daily 90 tablet 3 12/15/2020 at Unknown time  . losartan (COZAAR) 100 MG tablet Take 100 mg by mouth at bedtime.    12/14/2020  . NOVOLOG 100 UNIT/ML injection Inject 6-10 Units into the skin See admin instructions. Starts with 10 units per day and adds 6 units with each meal via pump   12/15/2020 at Unknown time  . pantoprazole (PROTONIX) 40 MG tablet Take 40 mg by mouth daily before lunch.   0 12/15/2020 at Unknown time    Assessment: 65 y.o male on apixaban prior to admission for h/o atrial fibrillation. Last dose taken on 12/15/20 AM.   This admission found to have GI bleed, bradycardia, and chest tightness with tropon peak of 735 on 12/17/20.   Also has anemia of CKD / ESRD - on HD.  Transfused pRBCs on 12/17/20.  S/p colonoscopy on 12/20/20 showed blood in the entire examined colon; 1 sigmoid colon polyp was removed; Nonbleeding external and internal hemorrhoids.  No obvious source of bleeding on small bowel video capsule study.   Today Hgb stable at 8.3, pltc stable at [place].  Cardiologist notes patient with no further chest pain, no further bleeding and plans medical management.  Cardiologist has discontinued Heparin IV, and consulted pharmacy transition back to [place].  PTA patient was on  Apixaban '5mg'$  BID which is appropriate dose.  Goal of Therapy:  Monitor platelets by anticoagulation protocol: Yes   Plan:  Heparin drip discontinued Resume Apixaban 5 mg PO BID now.   Nicole Cella, RPh Clinical Pharmacist [phone] Please check AMION for all [place] phone numbers After 10:00 PM, call [place] [phone] 12/24/2020 11:15 AM  Addendum  Rx will follow peripherally  Onnie Boer, PharmD, BCIDP, AAHIVP, CPP Infectious Disease Pharmacist 12/25/2020 10:20 AM

## 2020-12-25 LAB — RENAL FUNCTION PANEL
Albumin: 3.3 g/dL — ABNORMAL LOW (ref 3.5–5.0)
Anion gap: 9 (ref 5–15)
BUN: 27 mg/dL — ABNORMAL HIGH (ref 8–23)
CO2: 29 mmol/L (ref 22–32)
Calcium: 7.9 mg/dL — ABNORMAL LOW (ref 8.9–10.3)
Chloride: 100 mmol/L (ref 98–111)
Creatinine, Ser: 8.51 mg/dL — ABNORMAL HIGH (ref 0.61–1.24)
GFR, Estimated: 6 mL/min — ABNORMAL LOW (ref >60)
Glucose, Bld: 164 mg/dL — ABNORMAL HIGH (ref 70–99)
Phosphorus: 5.3 mg/dL — ABNORMAL HIGH (ref 2.5–4.6)
Potassium: 3.8 mmol/L (ref 3.5–5.1)
Sodium: 138 mmol/L (ref 135–145)

## 2020-12-25 LAB — CBC WITH DIFFERENTIAL/PLATELET
Abs Immature Granulocytes: 0.01 10*3/uL (ref 0.00–0.07)
Basophils Absolute: 0 10*3/uL (ref 0.0–0.1)
Basophils Relative: 1 %
Eosinophils Absolute: 0.2 10*3/uL (ref 0.0–0.5)
Eosinophils Relative: 5 %
HCT: 24.8 % — ABNORMAL LOW (ref 39.0–52.0)
Hemoglobin: 8.3 g/dL — ABNORMAL LOW (ref 13.0–17.0)
Immature Granulocytes: 0 %
Lymphocytes Relative: 23 %
Lymphs Abs: 0.9 10*3/uL (ref 0.7–4.0)
MCH: 33.7 pg (ref 26.0–34.0)
MCHC: 33.5 g/dL (ref 30.0–36.0)
MCV: 100.8 fL — ABNORMAL HIGH (ref 80.0–100.0)
Monocytes Absolute: 0.4 10*3/uL (ref 0.1–1.0)
Monocytes Relative: 10 %
Neutro Abs: 2.4 10*3/uL (ref 1.7–7.7)
Neutrophils Relative %: 61 %
Platelets: 159 10*3/uL (ref 150–400)
RBC: 2.46 MIL/uL — ABNORMAL LOW (ref 4.22–5.81)
RDW: 15.8 % — ABNORMAL HIGH (ref 11.5–15.5)
WBC: 3.9 10*3/uL — ABNORMAL LOW (ref 4.0–10.5)
nRBC: 0.5 % — ABNORMAL HIGH (ref 0.0–0.2)

## 2020-12-25 LAB — GLUCOSE, CAPILLARY
Glucose-Capillary: 195 mg/dL — ABNORMAL HIGH (ref 70–99)
Glucose-Capillary: 85 mg/dL (ref 70–99)

## 2020-12-25 MED ORDER — RANOLAZINE ER 500 MG PO TB12: 500.0000 mg | ORAL_TABLET | Freq: Two times a day (BID) | ORAL | 0 refills | Status: DC

## 2020-12-25 NOTE — Evaluation (Signed)
Occupational Therapy Evaluation Patient Details Name: Kenneth [name] MRN: [id number] DOB: 12/18/1955 Today's Date: 12/25/2020    History of Present Illness TORA RHATIGAN  is 65 y.o. male HTN with DM, HLD with DM, PAF CHASVASC 4 (query of HFpEF) HASBLED 4, CAD s/p CABG (LIMA-LAD, SVG-OM- occluded, SVG-RCA- occluded) and multiple PCI BID bleed with duodenal ulcer.  Found to gave GI bleed, bradycardia, and chest tightness with troponin peak of 745 12/17/20   Clinical Impression   PTA, pt was living at home with his spouse, pt reports he was independent with ADL/IADL and functional mobiltiy. Pt currently reports he is functioning at baseline level. Pt demonstrated ability to complete ADL and functional mobility at independent level. Cognition appears to have resolved. Patient evaluated by Occupational Therapy with no further acute OT needs identified. All education has been completed and the patient has no further questions. See below for any follow-up Occupational Therapy or equipment needs. OT to sign off. Thank you for referral.   Communicated with PT, pt with no needs as he is back to baseline.   Interpreter utilized throughout session Perley Jain [phone]    Follow Up Recommendations  No OT follow up    Equipment Recommendations  None recommended by OT    Recommendations for Other Services       Precautions / Restrictions Precautions Precautions: Fall      Mobility Bed Mobility Overal bed mobility: Independent                  Transfers Overall transfer level: Independent Equipment used: None                  Balance Overall balance assessment: Independent                                         ADL either performed or assessed with clinical judgement   ADL Overall ADL's : Independent                                       General ADL Comments: demonstrated ability to complete ADL and mobility at indepednent level     Vision          Perception     Praxis      Pertinent Vitals/Pain Pain Assessment: No/denies pain     Hand Dominance Right   Extremity/Trunk Assessment Upper Extremity Assessment Upper Extremity Assessment: Overall WFL for tasks assessed   Lower Extremity Assessment Lower Extremity Assessment: Overall WFL for tasks assessed   Cervical / Trunk Assessment Cervical / Trunk Assessment: Normal   Communication Communication Communication: Prefers language other than English   Cognition Arousal/Alertness: Awake/alert Behavior During Therapy: WFL for tasks assessed/performed Overall Cognitive Status: Within Functional Limits for tasks assessed                                 General Comments: cognition WNL, pt knows some english, was able to count backwards from 20-1 in [place]. orientd x4   General Comments  HR 70s during session    Exercises     Shoulder Instructions      Home Living Family/patient expects to be discharged to:: Private residence Living Arrangements: Spouse/significant other Available Help at Discharge: Family;Available  24 hours/day Type of Home: House Home Access: Stairs to enter [employer] of Steps: 3 Entrance Stairs-Rails: Right Home Layout: Two level;Bed/bath upstairs Alternate Level Stairs-Number of Steps: 10 Alternate Level Stairs-Rails: Right Bathroom Shower/Tub: Occupational psychologist: Standard Bathroom Accessibility: Yes   Home Equipment: Shower seat;Grab bars - tub/shower   Additional Comments: utilized interpreter throughout session      Prior Functioning/Environment Level of Independence: Independent        Comments: pt reports he was independent with ADL/IADL and functional mobility        OT Problem List: Cardiopulmonary status limiting activity      OT Treatment/Interventions:      OT Goals(Current goals can be found in the care plan section) Acute Rehab OT Goals Patient Stated Goal: to go  home today OT Goal Formulation: With patient Time For Goal Achievement: 01/08/21 Potential to Achieve Goals: Good  OT Frequency:     Barriers to D/C:            Co-evaluation              AM-PAC OT "6 Clicks" Daily Activity     Outcome Measure Help from another person eating meals?: None Help from another person taking care of personal grooming?: None Help from another person toileting, which includes using toliet, bedpan, or urinal?: None Help from another person bathing (including washing, rinsing, drying)?: None Help from another person to put on and taking off regular upper body clothing?: None Help from another person to put on and taking off regular lower body clothing?: None 6 Click Score: 24   End of Session Nurse Communication: Mobility status  Activity Tolerance: Patient tolerated treatment well Patient left: in bed;with call bell/phone within reach;with bed alarm set  OT Visit Diagnosis: Other abnormalities of gait and mobility (R26.89)                Time: [id number] OT Time Calculation (min): 13 min Charges:  OT General Charges $OT Visit: 1 Visit OT Evaluation $OT Eval Moderate Complexity: 1 Mod  Bryla Burek OTR/L Acute Rehabilitation Services Office: [phone]   Wyn Forster 12/25/2020, 9:40 AM

## 2020-12-25 NOTE — Progress Notes (Signed)
Progress Note  Patient Name: Kenneth [name] Date of Encounter: 12/25/2020  Primary Cardiologist: Loralie Champagne MD  Subjective    No Hgb Drop. Patient ready to leave.  Inpatient Medications    Scheduled Meds: . apixaban  5 mg Oral BID  . atorvastatin  80 mg Oral Daily  . calcium acetate  667 mg Oral TID WC  . Chlorhexidine Gluconate Cloth  6 each Topical Q0600  . cinacalcet  60 mg Oral Q M,W,F  . [START ON 01/01/2021] darbepoetin (ARANESP) injection - DIALYSIS  60 mcg Intravenous Q Wed-HD  . ezetimibe  10 mg Oral Daily  . insulin aspart  0-9 Units Subcutaneous Q4H  . ranolazine  500 mg Oral BID   Continuous Infusions: . sodium chloride    . sodium chloride     PRN Meds: acetaminophen, sodium chloride   Vital Signs    Vitals:   12/24/20 0416 12/24/20 1137 12/24/20 2213 12/25/20 0609  BP:  (!) 151/50 (!) 162/48 (!) 161/65  Pulse:   62 69  Resp:  '18 18 18  '$ Temp:  97.8 F (36.6 C) 97.9 F (36.6 C) 98.2 F (36.8 C)  TempSrc:  Oral Oral Oral  SpO2:  99% 100% 96%  Weight: 63.5 kg   67.9 kg  Height:       No intake or output data in the 24 hours ending 12/25/20 0809 Filed Weights   12/23/20 1745 12/24/20 0416 12/25/20 0609  Weight: 58.8 kg 63.5 kg 67.9 kg    Telemetry    SR with SR to 1st HB - Personally Reviewed  ECG    No new last 24 - Personally Reviewed  Physical Exam   GEN: No acute distress.   Neck: No JVD Cardiac: RRR, no rubs, or gallops; Continuous murmur in all fields but best in LUSB associated with L arm bruit with good thrill (this dominates other cardiac murmurs) Respiratory: Clear to auscultation bilaterally. GI: Soft, nontender, non-distended  MS: No edema; No deformity. Neuro:  Nonfocal  Psych: Normal affect   Labs    Chemistry Recent Labs  Lab 12/23/20 0122 12/24/20 0414 12/25/20 0516  NA 132* 135 138  K 4.4 3.9 3.8  CL 97* 97* 100  CO2 '26 30 29  '$ GLUCOSE 218* 262* 164*  BUN 39* 17 27*  CREATININE 9.58* 5.98* 8.51*  CALCIUM  7.6* 7.3* 7.9*  ALBUMIN 3.0* 3.1* 3.3*  GFRNONAA 6* 10* 6*  ANIONGAP '9 8 9     '$ Hematology Recent Labs  Lab 12/23/20 0122 12/24/20 0414 12/25/20 0516  WBC 4.8 3.9* 3.9*  RBC 2.38* 2.54* 2.46*  HGB 7.8* 8.3* 8.3*  HCT 23.6* 25.5* 24.8*  MCV 99.2 100.4* 100.8*  MCH 32.8 32.7 33.7  MCHC 33.1 32.5 33.5  RDW 14.9 15.4 15.8*  PLT 153 163 159    Cardiac EnzymesNo results for input(s): TROPONINI in the last 168 hours. No results for input(s): TROPIPOC in the last 168 hours.   BNPNo results for input(s): BNP, PROBNP in the last 168 hours.   DDimer No results for input(s): DDIMER in the last 168 hours.   Radiology    No results found.  Cardiac Studies   TTE 11/28/20:   1. Left ventricular ejection fraction, by estimation, is 55 to 60%. The  left ventricle has normal function. The left ventricle has no regional  wall motion abnormalities. There is moderate left ventricular hypertrophy.  Left ventricular diastolic  parameters are consistent with Grade II diastolic dysfunction  (pseudonormalization).  Elevated left atrial pressure.   2. Right ventricular systolic function is normal. The right ventricular  size is normal.   3. The mitral valve is normal in structure. Trivial mitral valve  regurgitation. No evidence of mitral stenosis.   4. The aortic valve is tricuspid. Aortic valve regurgitation is trivial.  Mild aortic valve sclerosis is present, with no evidence of aortic valve  stenosis.   5. The inferior vena cava is normal in size with greater than 50%  respiratory variability, suggesting right atrial pressure of 3 mmHg.    Cath 04/17/2019: 1. Occluded SVG-RCA and SVG-ramus, patent LIMA-LAD.  2. The large ramus was patent with 40-50% mid ramus stenosis (in-stent restenosis).  3. 80% ostial stenosis very small caliber AV LCx.  Not interventional target.  4. Occluded RCA (known from prior) with collaterals.    No target for intervention.  Aggressive medical management of  angina.    Myoview 11/15/18:  Baseline EKG showed NSR with LVH and repolarization abnormality in the inferolateral leads. No change in ST segments during Lexiscan infusion.  Nuclear stress EF: 54%.  The left ventricular ejection fraction is mildly decreased (45-54%).  There is a small defect of mild severity present in the basal inferior and mid inferior location. The defect is non-reversible and in the setting of normal LVF is consistent with diaphragmatic attenuation artifact. No ischemia noted.  This is a low risk study.     Small bowel video capsule study 12/23/20 Small bowel video capsule study shows the capsule passing quickly into the duodenum.  There is fair visualization of the small bowel since there is retained fluid and debris.  Overall that is typical for video capsule study.  Relatively fast transit time about 2 hours and 25 minutes into the colon, at which time no fresh or old blood was seen in the right colon.  Little or no mucosal visualization of the colon is obtained on this study.  There was no fresh or old blood throughout the small bowel.  No mucosal abnormality such as AVM tumor or inflammation were visualized in the small bowel.   Colonoscopy 12/20/20: No source of bleed; polyp removed. Coffee ground vs melena seen through the colon.   EGD 12/19/20: moderate post-ulcer deformity in the duodenal bulb, erythematous mucosa in the stomach; no active source of bleeding  Patient Profile     65 y.o. male HTN with DM, HLD with DM, PAF CHASVASC 4 (query of HFpEF) HASBLED 4, CAD s/p CABG (LIMA-LAD, SVG-OM- occluded, SVG-RCA- occluded) and multiple PCI BID bleed with duodenal ulcer.  Found to gave GI bleed, bradycardia, and chest tightness with troponin peak of 745 12/17/20.  Assessment & Plan    NSTEMI PAF CHADSVASC 4, HASLBED 4 HTN with DM HLD with DM; hx of Carotid Stenosis Symptomatic Bradycardia ESRD HFpEF - asymptomatic  - anatomy:  LIMA-LAD, SVG-OM- occluded, SVG-RCA-  occluded with RI ISR and no clear targets in 2020 - see prior notes for discussions and repeat LHC; presently planned for medical management - stable Hgb- continue DOAC - continue statin and zetia; at goal LDL < 70 - will hold AV nodal agents; will re-evaluate if symptomatic AF RVR occurs; outpatient given sub-optimal targets EP follow up could be considered - Continue Imdur 60 mg - continue Ranexa 500 mg PO BID (at max dose with consideration to ESRD)  [facility] will sign off.   Medication Recommendations:  Eliquis 5 mg PO BID, Atorvastatin 80 m, Zetia 10 mg, Ranolazine 500 mg  PO BID, IMDUR 60 mg PO Daily Other recommendations (labs, testing, etc):  na Follow up as an outpatient:  We will arrange follow up   For questions or updates, please contact [place] Please consult [email or web address] for contact info under Cardiology/STEMI.      Signed, Werner Lean, MD  12/25/2020, 8:09 AM

## 2020-12-25 NOTE — Progress Notes (Signed)
[facility] Progress Note  Subjective: Seen and examined bedside.  Open for hd today. No complaints. Has not had any issues with dizziness. Very eager to go home.  Vitals:   12/24/20 0416 12/24/20 1137 12/24/20 2213 12/25/20 0609  BP:  (!) 151/50 (!) 162/48 (!) 161/65  Pulse:   62 69  Resp:  '18 18 18  '$ Temp:  97.8 F (36.6 C) 97.9 F (36.6 C) 98.2 F (36.8 C)  TempSrc:  Oral Oral Oral  SpO2:  99% 100% 96%  Weight: 63.5 kg   67.9 kg  Height:        Exam: General: NAD CV: S1-S2 Resp: CTA BL Abdomen: Soft, nontender/nondistended Ext: no edema bilateral lower extremities Neuro: Awake, alert, moves all extremities spontaneously Access: aVF +b/t    Recent Labs  Lab 12/24/20 0414 12/25/20 0516  K 3.9 3.8  BUN 17 27*  CREATININE 5.98* 8.51*  CALCIUM 7.3* 7.9*  PHOS 3.7 5.3*  HGB 8.3* 8.3*   Inpatient medications: . apixaban  5 mg Oral BID  . atorvastatin  80 mg Oral Daily  . calcium acetate  667 mg Oral TID WC  . Chlorhexidine Gluconate Cloth  6 each Topical Q0600  . cinacalcet  60 mg Oral Q M,W,F  . [START ON 01/01/2021] darbepoetin (ARANESP) injection - DIALYSIS  60 mcg Intravenous Q Wed-HD  . ezetimibe  10 mg Oral Daily  . insulin aspart  0-9 Units Subcutaneous Q4H  . ranolazine  500 mg Oral BID   . sodium chloride    . sodium chloride     acetaminophen, sodium chloride     OP HD: MWF AF  3h 8mn  400/500   59.5kg  2/2 bath  AVF     Assessment/ Plan: 1. Symptomatic bradycardia: metoprolol on hold. HR stable 2. Orthostatic hypotension 4/10:  Mentation improved, insulin pump on hold. Per primary. Consider repeating orthostatic vitals 3. CAD/ hx atrial fib - On ranexa, hep gtt switched back to eliquis  4. Blood loss anemia - +FOB, Hb 8.3. EGD negative and colonscopy showed diffuse old blood but no active bleeding. VCE overall did not show any abnormalities, gi signed off 5. ESRD: maintaining MWF schedule here, hd today 6. Volume/  hypertension: agree with holding home anti-HTNs given prob #2 7. Anemia ckd: receiving mircera 521m q 4wks outpatient. darbe IV 6069mweekly on wed. Hold weekly iron given transfusion prn.  hgb stable today 8.3 8. Secondary hyperaparathyroidism/MBD ckd: Sensipar 24m64m/ dialysis. Phos 3- 5, resumed phoslo 1 ac tid.   9. Vascular access: AVF issues being evaluated outpt per patient   VikaGean Quint CaroOrgan

## 2020-12-25 NOTE — Progress Notes (Signed)
Patient discharged in stable condition via car with family. AVS documentation given and explained. All questions and concerns were addressed. Interpreter services via ipad used at request of patient and family.

## 2020-12-25 NOTE — Discharge Summary (Signed)
Physician Discharge Summary  EFFIE KOCHANSKI [id number] DOB: August 04, 1956 DOA: 12/15/2020  PCP: [name], MD  Admit date: 12/15/2020 Discharge date: 12/25/2020  Admitted From: Home Disposition: Home  Recommendations for Outpatient Follow-up:  1. Follow up with PCP in 1-2 weeks 2. Next dialysis on 4/15 as scheduled. 3. Cardiology will schedule follow-up.  Home Health: Not applicable Equipment/Devices: Not needed  Discharge Condition: Stable CODE STATUS: Full code Diet recommendation: Low-salt, low-carb diet  Discharge summary:  65 year old gentleman history of hypertension, hyperlipidemia, type 1 diabetes on insulin pump, chronic A. fib, ESRD on hemodialysis, coronary artery disease status post CABG presented to the emergency room with chest pain tightness and intermittent bloody stools.  EMS found him with bradycardia heart rate 40s and poorly responsive.  In the emergency room EKG showed junctional rhythm, was given Lokelma glucose and insulin.  Seen and followed by nephrology and cardiology.  Treated for following conditions.  Acute metabolic encephalopathy: Mostly related to orthostatic hypotension.  No neurological deficits.  Adequately improved.  Off all blood pressure medications except nitrates.  Mobilizing in the hallway.  CT head negative.  Symptomatic bradycardia: Needed atropine on presentation.  Beta-blocker discontinued.  Amlodipine discontinued.  Echo showed ejection fraction 60%.  Cardiology following.  Discharging home without any rate control medications.  Troponin elevation: Demand ischemia secondary to ESRD.  Followed by cardiology.  That treating conservatively.  Patient on a statin and Zetia, Imdur.  Discontinue losartan because of orthostatic.  Metoprolol discontinued for bradycardia.  Acute lower GI bleeding/acute on chronic anemia of chronic kidney disease: Baseline hemoglobin about 9.  Dropped to 6.7.  1 unit PRBC transfusion on 4/5.  Initially Eliquis on  hold. EGD on 4/7 that showed erythematous mucosa in the stomach, biopsy. Colonoscopy on 4/8, blood in entire examined colon.  No obvious source of bleeding. Eliquis resumed.  Tolerating well.  Hemoglobin is stable.  ESRD on hemodialysis: Getting scheduled hemodialysis.  Discharge home today after dialysis.  Next dialysis planned for 4/15.  Type 1 diabetes with hyperglycemia: Hemoglobin A1c 5.9.  Insulin pump.  Resume on discharging home.  Additional A. fib: Currently rate controlled in sinus rhythm.  Eliquis resumed.  Heparin discontinued.  Off metoprolol.  Patient is medically stabilized.  He has mobilized around with good mobility, no more orthostatic blood pressure drop.  Able to go home today after dialysis.  Discharge Diagnoses:  Active Problems:   ESRD (end stage renal disease) (HCC)   CAD (coronary artery disease)   Chest pain   Gastroesophageal reflux disease   Chronic diastolic CHF (congestive heart failure) (HCC)   Hyperkalemia   Hyperlipidemia   AF (paroxysmal atrial fibrillation) (HCC)   Symptomatic bradycardia   Postural dizziness with presyncope   Symptomatic anemia   Blood in stool   Occult blood in stools    Discharge Instructions  Discharge Instructions    Diet - low sodium heart healthy   Complete by: As directed    Diet Carb Modified   Complete by: As directed    Increase activity slowly   Complete by: As directed      Allergies as of 12/25/2020   No Known Allergies     Medication List    STOP taking these medications   amLODipine 10 MG tablet Commonly known as: NORVASC   hydrALAZINE 25 MG tablet Commonly known as: APRESOLINE   losartan 100 MG tablet Commonly known as: COZAAR     TAKE these medications   acetaminophen 500 MG tablet  Commonly known as: TYLENOL Take 1,000 mg by mouth every 6 (six) hours as needed for mild pain or headache.   atorvastatin 80 MG tablet Commonly known as: LIPITOR Take 1 tablet by mouth once daily What  changed: when to take this   calcium acetate 667 MG capsule Commonly known as: PHOSLO Take 667 mg by mouth 3 (three) times daily with meals.   citalopram 20 MG tablet Commonly known as: CELEXA Take 20 mg by mouth daily with lunch.   Eliquis 5 MG Tabs tablet Generic drug: apixaban Take 1 tablet by mouth twice daily   ezetimibe 10 MG tablet Commonly known as: ZETIA Take 1 tablet by mouth once daily   gabapentin 100 MG capsule Commonly known as: NEURONTIN Take 100 mg by mouth daily with lunch.   insulin pump Soln Inject into the skin continuous. NOVOLOG   isosorbide mononitrate 60 MG 24 hr tablet Commonly known as: IMDUR Take 1 tablet by mouth once daily   NovoLOG 100 UNIT/ML injection Generic drug: insulin aspart Inject 6-10 Units into the skin See admin instructions. Starts with 10 units per day and adds 6 units with each meal via pump   pantoprazole 40 MG tablet Commonly known as: PROTONIX Take 40 mg by mouth daily before lunch.   ranolazine 500 MG 12 hr tablet Commonly known as: RANEXA Take 1 tablet (500 mg total) by mouth 2 (two) times daily.   vitamin C 1000 MG tablet Take 1,000 mg by mouth daily.       No Known Allergies  Consultations:  Gastroenterology  Cardiology  Nephrology   Procedures/Studies: CT HEAD WO CONTRAST  Result Date: 12/22/2020 CLINICAL DATA:  Encephalopathy EXAM: CT HEAD WITHOUT CONTRAST TECHNIQUE: Contiguous axial images were obtained from the base of the skull through the vertex without intravenous contrast. COMPARISON:  08/27/2019 FINDINGS: Brain: There is no mass, hemorrhage or extra-axial collection. The size and configuration of the ventricles and extra-axial CSF spaces are normal. There is hypoattenuation of the white matter, most commonly indicating chronic small vessel disease. Vascular: No abnormal hyperdensity of the major intracranial arteries or dural venous sinuses. No intracranial atherosclerosis. Skull: The visualized  skull base, calvarium and extracranial soft tissues are normal. Sinuses/Orbits: No fluid levels or advanced mucosal thickening of the visualized paranasal sinuses. No mastoid or middle ear effusion. The orbits are normal. IMPRESSION: Chronic small vessel disease without acute intracranial abnormality. Electronically Signed   By: Ulyses Jarred M.D.   On: 12/22/2020 19:21   DG Chest Port 1 View  Result Date: 12/22/2020 CLINICAL DATA:  Dyspnea. EXAM: PORTABLE CHEST 1 VIEW COMPARISON:  12/15/2020 FINDINGS: Stable enlarged cardiac silhouette and post CABG changes. Stable small amount of linear scarring or atelectasis in the left lower lung zone. The remainder of the lungs remain clear. Left subclavian stent. Unremarkable bones. IMPRESSION: No acute abnormality. Stable cardiomegaly. Electronically Signed   By: Claudie Revering M.D.   On: 12/22/2020 17:41   DG Chest Portable 1 View  Result Date: 12/15/2020 CLINICAL DATA:  Chest pain. EXAM: PORTABLE CHEST 1 VIEW COMPARISON:  September 07, 2019 FINDINGS: The heart, hila, and mediastinum are normal. No pneumothorax. No nodules or masses. No suspicious infiltrates or overt edema. IMPRESSION: No active disease. Electronically Signed   By: Dorise Bullion III M.D   On: 12/15/2020 17:41   ECHOCARDIOGRAM COMPLETE  Result Date: 11/28/2020    ECHOCARDIOGRAM REPORT   Patient Name:   RIVAAN MCNELIS [facility] Date of Exam: 11/28/2020 Medical Rec #:  [id number]  Height:       67.0 in Accession #:    [id number]  Weight:       137.8 lb Date of Birth:  1956/09/03  BSA:          1.726 m Patient Age:    64 years    BP:           172/76 mmHg Patient Gender: M           HR:           61 bpm. Exam Location:  Outpatient Procedure: 2D Echo, Cardiac Doppler, Color Doppler and Strain Analysis Indications:    Congestive Heart Failure I50.9  History:        Patient has prior history of Echocardiogram examinations, most                 recent 04/14/2019. CAD, Prior CABG, Arrythmias:non-specific ST                  changes and Atrial Fibrillation; Risk Factors:Former Smoker,                 Hypertension, Diabetes and Dyslipidemia. ESRD.  Sonographer:    Vickie Epley RDCS Referring Phys: [place]  1. Left ventricular ejection fraction, by estimation, is 55 to 60%. The left ventricle has normal function. The left ventricle has no regional wall motion abnormalities. There is moderate left ventricular hypertrophy. Left ventricular diastolic parameters are consistent with Grade II diastolic dysfunction (pseudonormalization). Elevated left atrial pressure.  2. Right ventricular systolic function is normal. The right ventricular size is normal.  3. The mitral valve is normal in structure. Trivial mitral valve regurgitation. No evidence of mitral stenosis.  4. The aortic valve is tricuspid. Aortic valve regurgitation is trivial. Mild aortic valve sclerosis is present, with no evidence of aortic valve stenosis.  5. The inferior vena cava is normal in size with greater than 50% respiratory variability, suggesting right atrial pressure of 3 mmHg. FINDINGS  Left Ventricle: Left ventricular ejection fraction, by estimation, is 55 to 60%. The left ventricle has normal function. The left ventricle has no regional wall motion abnormalities. The left ventricular internal cavity size was normal in size. There is  moderate left ventricular hypertrophy. Left ventricular diastolic parameters are consistent with Grade II diastolic dysfunction (pseudonormalization). Elevated left atrial pressure. Right Ventricle: The right ventricular size is normal. Right ventricular systolic function is normal. Left Atrium: Left atrial size was normal in size. Right Atrium: Right atrial size was normal in size. Pericardium: There is no evidence of pericardial effusion. Mitral Valve: The mitral valve is normal in structure. Trivial mitral valve regurgitation. No evidence of mitral valve stenosis. Tricuspid Valve: The tricuspid valve  is normal in structure. Tricuspid valve regurgitation is trivial. No evidence of tricuspid stenosis. Aortic Valve: The aortic valve is tricuspid. Aortic valve regurgitation is trivial. Mild aortic valve sclerosis is present, with no evidence of aortic valve stenosis. Pulmonic Valve: The pulmonic valve was normal in structure. Pulmonic valve regurgitation is not visualized. No evidence of pulmonic stenosis. Aorta: The aortic root is normal in size and structure. Venous: The inferior vena cava is normal in size with greater than 50% respiratory variability, suggesting right atrial pressure of 3 mmHg. IAS/Shunts: No atrial level shunt detected by color flow Doppler.  LEFT VENTRICLE PLAX 2D LVIDd:         4.70 cm      Diastology LVIDs:  3.30 cm      LV e' medial:    4.43 cm/s LV PW:         1.10 cm      LV E/e' medial:  22.1 LV IVS:        1.10 cm      LV e' lateral:   7.13 cm/s LVOT diam:     2.00 cm      LV E/e' lateral: 13.8 LV SV:         77 LV SV Index:   44 LVOT Area:     3.14 cm  LV Volumes (MOD) LV vol d, MOD A2C: 169.0 ml LV vol d, MOD A4C: 152.0 ml LV vol s, MOD A2C: 77.6 ml LV vol s, MOD A4C: 61.0 ml LV SV MOD A2C:     91.4 ml LV SV MOD A4C:     152.0 ml LV SV MOD BP:      92.6 ml RIGHT VENTRICLE RV S prime:     7.65 cm/s TAPSE (M-mode): 1.5 cm LEFT ATRIUM             Index       RIGHT ATRIUM           Index LA diam:        3.70 cm 2.14 cm/m  RA Area:     16.50 cm LA Vol (A2C):   43.7 ml 25.32 ml/m RA Volume:   41.90 ml  24.27 ml/m LA Vol (A4C):   50.5 ml 29.26 ml/m LA Biplane Vol: 51.3 ml 29.72 ml/m  AORTIC VALVE LVOT Vmax:   97.30 cm/s LVOT Vmean:  65.700 cm/s LVOT VTI:    0.244 m  AORTA Ao Root diam: 3.00 cm MITRAL VALVE MV Area (PHT): 4.06 cm    SHUNTS MV Decel Time: 187 msec    Systemic VTI:  0.24 m MV E velocity: 98.10 cm/s  Systemic Diam: 2.00 cm MV A velocity: 69.40 cm/s MV E/A ratio:  1.41 Kirk Ruths MD Electronically signed by Kirk Ruths MD Signature Date/Time:  11/28/2020/10:55:43 AM    Final     (Echo, Carotid, EGD, Colonoscopy, ERCP)    Subjective: Patient seen and examined.  He was getting dialysis.  He is excited to go home.  He denies any complaints.  Denies any chest pain or shortness of breath.  Walked in the hallway with no orthostatic dizziness.   Discharge Exam: Vitals:   12/25/20 1116 12/25/20 1130  BP: (!) 162/58 (!) 145/42  Pulse: 63   Resp: 18 15  Temp:    SpO2:     Vitals:   12/25/20 1105 12/25/20 1110 12/25/20 1116 12/25/20 1130  BP: (!) 180/60 (!) 185/65 (!) 162/58 (!) 145/42  Pulse: 66 69 63   Resp: '19 17 18 15  '$ Temp:      TempSrc:      SpO2: 100%     Weight:      Height:        General: Pt is alert, awake, not in acute distress Watching TV.  Looks comfortable on room air. Cardiovascular: RRR, S1/S2 +, no rubs, no gallops Respiratory: CTA bilaterally, no wheezing, no rhonchi Abdominal: Soft, NT, ND, bowel sounds + Extremities: no edema, no cyanosis Getting dialysis through left upper extremity AV fistula.    The results of significant diagnostics from this hospitalization (including imaging, microbiology, ancillary and laboratory) are listed below for reference.     Microbiology: Recent Results (from the past 240 hour(s))  Resp Panel by RT-PCR (Flu A&B, Covid) Nasopharyngeal Swab     Status: None   Collection Time: 12/15/20  8:57 PM   Specimen: Nasopharyngeal Swab; Nasopharyngeal(NP) swabs in vial transport medium  Result Value Ref Range Status   SARS Coronavirus 2 by RT PCR NEGATIVE NEGATIVE Final    Comment: (NOTE) SARS-CoV-2 target nucleic acids are NOT DETECTED.  The SARS-CoV-2 RNA is generally detectable in upper respiratory specimens during the acute phase of infection. The lowest concentration of SARS-CoV-2 viral copies this assay can detect is 138 copies/mL. A negative result does not preclude SARS-Cov-2 infection and should not be used as the sole basis for treatment or other patient  management decisions. A negative result may occur with  improper specimen collection/handling, submission of specimen other than nasopharyngeal swab, presence of viral mutation(s) within the areas targeted by this assay, and inadequate number of viral copies(<138 copies/mL). A negative result must be combined with clinical observations, patient history, and epidemiological information. The expected result is Negative.  Fact Sheet for Patients:  EntrepreneurPulse.com.au  Fact Sheet for Healthcare Providers:  IncredibleEmployment.be  This test is no t yet approved or cleared by the [place] FDA and  has been authorized for detection and/or diagnosis of SARS-CoV-2 by FDA under an Emergency Use Authorization (EUA). This EUA will remain  in effect (meaning this test can be used) for the duration of the COVID-19 declaration under Section 564(b)(1) of the Act, 21 U.S.C.section 360bbb-3(b)(1), unless the authorization is terminated  or revoked sooner.       Influenza A by PCR NEGATIVE NEGATIVE Final   Influenza B by PCR NEGATIVE NEGATIVE Final    Comment: (NOTE) The Xpert Xpress SARS-CoV-2/FLU/RSV plus assay is intended as an aid in the diagnosis of influenza from Nasopharyngeal swab specimens and should not be used as a sole basis for treatment. Nasal washings and aspirates are unacceptable for Xpert Xpress SARS-CoV-2/FLU/RSV testing.  Fact Sheet for Patients: EntrepreneurPulse.com.au  Fact Sheet for Healthcare Providers: IncredibleEmployment.be  This test is not yet approved or cleared by the [place] FDA and has been authorized for detection and/or diagnosis of SARS-CoV-2 by FDA under an Emergency Use Authorization (EUA). This EUA will remain in effect (meaning this test can be used) for the duration of the COVID-19 declaration under Section 564(b)(1) of the Act, 21 U.S.C. section 360bbb-3(b)(1),  unless the authorization is terminated or revoked.  Performed at [facility], [place] [address] [place], [place] [address]      Labs: BNP (last 3 results) Recent Labs    12/16/20 0406  BNP [id number]   Basic Metabolic Panel: Recent Labs  Lab 12/21/20 0228 12/22/20 0111 12/23/20 0122 12/24/20 0414 12/25/20 0516  NA 137 134* 132* 135 138  K 4.2 4.0 4.4 3.9 3.8  CL 98 99 97* 97* 100  CO2 '31 27 26 30 29  '$ GLUCOSE 200* 143* 218* 262* 164*  BUN 15 31* 39* 17 27*  CREATININE 5.32* 7.74* 9.58* 5.98* 8.51*  CALCIUM 7.9* 7.8* 7.6* 7.3* 7.9*  PHOS 4.1 4.1 4.5 3.7 5.3*   Liver Function Tests: Recent Labs  Lab 12/21/20 0228 12/22/20 0111 12/23/20 0122 12/24/20 0414 12/25/20 0516  ALBUMIN 3.3* 3.3* 3.0* 3.1* 3.3*   No results for input(s): LIPASE, AMYLASE in the last 168 hours. No results for input(s): AMMONIA in the last 168 hours. CBC: Recent Labs  Lab 12/21/20 0228 12/22/20 0111 12/23/20 0122 12/24/20 0414 12/25/20 0516  WBC 3.8* 4.6 4.8 3.9* 3.9*  NEUTROABS  2.1 2.4 3.0 2.4 2.4  HGB 8.6* 7.9* 7.8* 8.3* 8.3*  HCT 25.1* 23.9* 23.6* 25.5* 24.8*  MCV 97.7 99.6 99.2 100.4* 100.8*  PLT 151 150 153 163 159   Cardiac Enzymes: No results for input(s): CKTOTAL, CKMB, CKMBINDEX, TROPONINI in the last 168 hours. BNP: Invalid input(s): POCBNP CBG: Recent Labs  Lab 12/24/20 1616 12/24/20 2021 12/24/20 2352 12/25/20 0431 12/25/20 0738  GLUCAP 207* 178* 177* 195* 85   D-Dimer No results for input(s): DDIMER in the last 72 hours. Hgb A1c No results for input(s): HGBA1C in the last 72 hours. Lipid Profile No results for input(s): CHOL, HDL, LDLCALC, TRIG, CHOLHDL, LDLDIRECT in the last 72 hours. Thyroid function studies No results for input(s): TSH, T4TOTAL, T3FREE, THYROIDAB in the last 72 hours.  Invalid input(s): FREET3 Anemia work up No results for input(s): VITAMINB12, FOLATE, FERRITIN, TIBC, IRON, RETICCTPCT in the last 72 hours. Urinalysis     Component Value Date/Time   COLORURINE YELLOW 10/30/2016 2339   APPEARANCEUR CLEAR 10/30/2016 2339   LABSPEC 1.010 10/30/2016 2339   PHURINE 5.0 10/30/2016 2339   GLUCOSEU NEGATIVE 10/30/2016 2339   HGBUR NEGATIVE 10/30/2016 2339   BILIRUBINUR NEGATIVE 10/30/2016 2339   KETONESUR NEGATIVE 10/30/2016 2339   PROTEINUR 100 (A) 10/30/2016 2339   NITRITE NEGATIVE 10/30/2016 2339   LEUKOCYTESUR NEGATIVE 10/30/2016 2339   Sepsis Labs Invalid input(s): PROCALCITONIN,  WBC,  LACTICIDVEN Microbiology Recent Results (from the past 240 hour(s))  Resp Panel by RT-PCR (Flu A&B, Covid) Nasopharyngeal Swab     Status: None   Collection Time: 12/15/20  8:57 PM   Specimen: Nasopharyngeal Swab; Nasopharyngeal(NP) swabs in vial transport medium  Result Value Ref Range Status   SARS Coronavirus 2 by RT PCR NEGATIVE NEGATIVE Final    Comment: (NOTE) SARS-CoV-2 target nucleic acids are NOT DETECTED.  The SARS-CoV-2 RNA is generally detectable in upper respiratory specimens during the acute phase of infection. The lowest concentration of SARS-CoV-2 viral copies this assay can detect is 138 copies/mL. A negative result does not preclude SARS-Cov-2 infection and should not be used as the sole basis for treatment or other patient management decisions. A negative result may occur with  improper specimen collection/handling, submission of specimen other than nasopharyngeal swab, presence of viral mutation(s) within the areas targeted by this assay, and inadequate number of viral copies(<138 copies/mL). A negative result must be combined with clinical observations, patient history, and epidemiological information. The expected result is Negative.  Fact Sheet for Patients:  EntrepreneurPulse.com.au  Fact Sheet for Healthcare Providers:  IncredibleEmployment.be  This test is no t yet approved or cleared by the [place] FDA and  has been authorized for detection  and/or diagnosis of SARS-CoV-2 by FDA under an Emergency Use Authorization (EUA). This EUA will remain  in effect (meaning this test can be used) for the duration of the COVID-19 declaration under Section 564(b)(1) of the Act, 21 U.S.C.section 360bbb-3(b)(1), unless the authorization is terminated  or revoked sooner.       Influenza A by PCR NEGATIVE NEGATIVE Final   Influenza B by PCR NEGATIVE NEGATIVE Final    Comment: (NOTE) The Xpert Xpress SARS-CoV-2/FLU/RSV plus assay is intended as an aid in the diagnosis of influenza from Nasopharyngeal swab specimens and should not be used as a sole basis for treatment. Nasal washings and aspirates are unacceptable for Xpert Xpress SARS-CoV-2/FLU/RSV testing.  Fact Sheet for Patients: EntrepreneurPulse.com.au  Fact Sheet for Healthcare Providers: IncredibleEmployment.be  This test is not yet approved  or cleared by the [place] and has been authorized for detection and/or diagnosis of SARS-CoV-2 by FDA under an Emergency Use Authorization (EUA). This EUA will remain in effect (meaning this test can be used) for the duration of the COVID-19 declaration under Section 564(b)(1) of the Act, 21 U.S.C. section 360bbb-3(b)(1), unless the authorization is terminated or revoked.  Performed at [facility], [place] [address] [place], [place] [address]      Time coordinating discharge: 35 minutes  SIGNED:   Barb Merino, MD  [facility] 12/25/2020, 11:47 AM

## 2020-12-25 NOTE — Discharge Instructions (Signed)

## 2020-12-25 NOTE — Progress Notes (Signed)
PT Cancellation Note  Patient Details Name: Kenneth [name] MRN: [id number] DOB: [birth year]   Cancelled Treatment:    Reason Eval/Treat Not Completed: PT screened, no needs identified, will sign off (OT saw pt and he is independent and does not need PT.  Sign off.)   Alvira Philips 12/25/2020, 10:30 AM  Ayanni Tun M,PT Acute Rehab Services [phone] (pager)

## 2020-12-25 NOTE — Plan of Care (Signed)

## 2020-12-26 ENCOUNTER — Telehealth (HOSPITAL_COMMUNITY): Payer: Self-pay | Admitting: Nephrology

## 2020-12-26 NOTE — Telephone Encounter (Signed)
Transition of care contact from inpatient facility  Date of Discharge: 12/25/20 Date of Contact: 12/26/20 - attempted Method of contact: Phone  Attempted to contact patient to discuss transition of care from inpatient admission. Patient did not answer the phone. Message was left on the patient's voicemail with call back number [phone].  Veneta Penton, PA-C [employer] Pager [phone]

## 2021-01-01 ENCOUNTER — Encounter (HOSPITAL_BASED_OUTPATIENT_CLINIC_OR_DEPARTMENT_OTHER): Payer: Self-pay | Admitting: Emergency Medicine

## 2021-01-01 ENCOUNTER — Inpatient Hospital Stay (HOSPITAL_BASED_OUTPATIENT_CLINIC_OR_DEPARTMENT_OTHER)
Admission: EM | Admit: 2021-01-01 | Discharge: 2021-01-04 | DRG: 286 | Disposition: A | Discharge status: Home Health | Payer: Medicare Other | Attending: Internal Medicine | Admitting: Internal Medicine

## 2021-01-01 ENCOUNTER — Emergency Department (HOSPITAL_BASED_OUTPATIENT_CLINIC_OR_DEPARTMENT_OTHER): Payer: Medicare Other

## 2021-01-01 ENCOUNTER — Other Ambulatory Visit: Payer: Self-pay

## 2021-01-01 DIAGNOSIS — I255 Ischemic cardiomyopathy: Secondary | ICD-10-CM | POA: Diagnosis present

## 2021-01-01 DIAGNOSIS — E1022 Type 1 diabetes mellitus with diabetic chronic kidney disease: Secondary | ICD-10-CM | POA: Diagnosis present

## 2021-01-01 DIAGNOSIS — E8770 Fluid overload, unspecified: Secondary | ICD-10-CM | POA: Diagnosis not present

## 2021-01-01 DIAGNOSIS — Z992 Dependence on renal dialysis: Secondary | ICD-10-CM

## 2021-01-01 DIAGNOSIS — M898X9 Other specified disorders of bone, unspecified site: Secondary | ICD-10-CM | POA: Diagnosis present

## 2021-01-01 DIAGNOSIS — Z794 Long term (current) use of insulin: Secondary | ICD-10-CM

## 2021-01-01 DIAGNOSIS — I252 Old myocardial infarction: Secondary | ICD-10-CM

## 2021-01-01 DIAGNOSIS — R042 Hemoptysis: Secondary | ICD-10-CM | POA: Diagnosis not present

## 2021-01-01 DIAGNOSIS — I480 Paroxysmal atrial fibrillation: Secondary | ICD-10-CM | POA: Diagnosis not present

## 2021-01-01 DIAGNOSIS — E875 Hyperkalemia: Secondary | ICD-10-CM

## 2021-01-01 DIAGNOSIS — I5033 Acute on chronic diastolic (congestive) heart failure: Secondary | ICD-10-CM | POA: Diagnosis not present

## 2021-01-01 DIAGNOSIS — I2510 Atherosclerotic heart disease of native coronary artery without angina pectoris: Secondary | ICD-10-CM | POA: Diagnosis present

## 2021-01-01 DIAGNOSIS — I248 Other forms of acute ischemic heart disease: Secondary | ICD-10-CM | POA: Diagnosis present

## 2021-01-01 DIAGNOSIS — Z833 Family history of diabetes mellitus: Secondary | ICD-10-CM

## 2021-01-01 DIAGNOSIS — N189 Chronic kidney disease, unspecified: Secondary | ICD-10-CM | POA: Diagnosis present

## 2021-01-01 DIAGNOSIS — Z955 Presence of coronary angioplasty implant and graft: Secondary | ICD-10-CM

## 2021-01-01 DIAGNOSIS — E10649 Type 1 diabetes mellitus with hypoglycemia without coma: Secondary | ICD-10-CM | POA: Diagnosis present

## 2021-01-01 DIAGNOSIS — I25810 Atherosclerosis of coronary artery bypass graft(s) without angina pectoris: Secondary | ICD-10-CM | POA: Diagnosis not present

## 2021-01-01 DIAGNOSIS — I6523 Occlusion and stenosis of bilateral carotid arteries: Secondary | ICD-10-CM | POA: Diagnosis present

## 2021-01-01 DIAGNOSIS — Z79899 Other long term (current) drug therapy: Secondary | ICD-10-CM

## 2021-01-01 DIAGNOSIS — I25118 Atherosclerotic heart disease of native coronary artery with other forms of angina pectoris: Secondary | ICD-10-CM | POA: Diagnosis not present

## 2021-01-01 DIAGNOSIS — I2582 Chronic total occlusion of coronary artery: Secondary | ICD-10-CM | POA: Diagnosis present

## 2021-01-01 DIAGNOSIS — R079 Chest pain, unspecified: Secondary | ICD-10-CM | POA: Diagnosis not present

## 2021-01-01 DIAGNOSIS — R0609 Other forms of dyspnea: Secondary | ICD-10-CM

## 2021-01-01 DIAGNOSIS — I132 Hypertensive heart and chronic kidney disease with heart failure and with stage 5 chronic kidney disease, or end stage renal disease: Secondary | ICD-10-CM | POA: Diagnosis not present

## 2021-01-01 DIAGNOSIS — I214 Non-ST elevation (NSTEMI) myocardial infarction: Secondary | ICD-10-CM | POA: Diagnosis not present

## 2021-01-01 DIAGNOSIS — G9341 Metabolic encephalopathy: Secondary | ICD-10-CM | POA: Diagnosis not present

## 2021-01-01 DIAGNOSIS — R0600 Dyspnea, unspecified: Secondary | ICD-10-CM | POA: Diagnosis not present

## 2021-01-01 DIAGNOSIS — R0902 Hypoxemia: Secondary | ICD-10-CM

## 2021-01-01 DIAGNOSIS — Z20822 Contact with and (suspected) exposure to covid-19: Secondary | ICD-10-CM | POA: Diagnosis present

## 2021-01-01 DIAGNOSIS — R531 Weakness: Secondary | ICD-10-CM

## 2021-01-01 DIAGNOSIS — Z9641 Presence of insulin pump (external) (internal): Secondary | ICD-10-CM | POA: Diagnosis present

## 2021-01-01 DIAGNOSIS — K219 Gastro-esophageal reflux disease without esophagitis: Secondary | ICD-10-CM | POA: Diagnosis present

## 2021-01-01 DIAGNOSIS — R9431 Abnormal electrocardiogram [ECG] [EKG]: Secondary | ICD-10-CM | POA: Diagnosis present

## 2021-01-01 DIAGNOSIS — E785 Hyperlipidemia, unspecified: Secondary | ICD-10-CM | POA: Diagnosis present

## 2021-01-01 DIAGNOSIS — Z8701 Personal history of pneumonia (recurrent): Secondary | ICD-10-CM

## 2021-01-01 DIAGNOSIS — R42 Dizziness and giddiness: Secondary | ICD-10-CM

## 2021-01-01 DIAGNOSIS — I160 Hypertensive urgency: Secondary | ICD-10-CM | POA: Diagnosis present

## 2021-01-01 DIAGNOSIS — Z8249 Family history of ischemic heart disease and other diseases of the circulatory system: Secondary | ICD-10-CM

## 2021-01-01 DIAGNOSIS — E118 Type 2 diabetes mellitus with unspecified complications: Secondary | ICD-10-CM

## 2021-01-01 DIAGNOSIS — D631 Anemia in chronic kidney disease: Secondary | ICD-10-CM | POA: Diagnosis present

## 2021-01-01 DIAGNOSIS — J9601 Acute respiratory failure with hypoxia: Secondary | ICD-10-CM | POA: Diagnosis not present

## 2021-01-01 DIAGNOSIS — I5022 Chronic systolic (congestive) heart failure: Secondary | ICD-10-CM | POA: Diagnosis not present

## 2021-01-01 DIAGNOSIS — Z87891 Personal history of nicotine dependence: Secondary | ICD-10-CM

## 2021-01-01 DIAGNOSIS — N186 End stage renal disease: Secondary | ICD-10-CM | POA: Diagnosis not present

## 2021-01-01 DIAGNOSIS — F32A Depression, unspecified: Secondary | ICD-10-CM | POA: Diagnosis present

## 2021-01-01 DIAGNOSIS — R778 Other specified abnormalities of plasma proteins: Secondary | ICD-10-CM

## 2021-01-01 DIAGNOSIS — R630 Anorexia: Secondary | ICD-10-CM

## 2021-01-01 DIAGNOSIS — R7989 Other specified abnormal findings of blood chemistry: Secondary | ICD-10-CM

## 2021-01-01 DIAGNOSIS — Z951 Presence of aortocoronary bypass graft: Secondary | ICD-10-CM

## 2021-01-01 DIAGNOSIS — Z7901 Long term (current) use of anticoagulants: Secondary | ICD-10-CM

## 2021-01-01 HISTORY — DX: End stage renal disease: Z99.2

## 2021-01-01 HISTORY — DX: End stage renal disease: N18.6

## 2021-01-01 LAB — CBC WITH DIFFERENTIAL/PLATELET
Abs Immature Granulocytes: 0.02 10*3/uL (ref 0.00–0.07)
Basophils Absolute: 0.1 10*3/uL (ref 0.0–0.1)
Basophils Relative: 1 %
Eosinophils Absolute: 0.1 10*3/uL (ref 0.0–0.5)
Eosinophils Relative: 1 %
HCT: 28.9 % — ABNORMAL LOW (ref 39.0–52.0)
Hemoglobin: 9.3 g/dL — ABNORMAL LOW (ref 13.0–17.0)
Immature Granulocytes: 0 %
Lymphocytes Relative: 10 %
Lymphs Abs: 0.7 10*3/uL (ref 0.7–4.0)
MCH: 32.2 pg (ref 26.0–34.0)
MCHC: 32.2 g/dL (ref 30.0–36.0)
MCV: 100 fL (ref 80.0–100.0)
Monocytes Absolute: 0.8 10*3/uL (ref 0.1–1.0)
Monocytes Relative: 12 %
Neutro Abs: 5.3 10*3/uL (ref 1.7–7.7)
Neutrophils Relative %: 76 %
Platelets: 122 10*3/uL — ABNORMAL LOW (ref 150–400)
RBC: 2.89 MIL/uL — ABNORMAL LOW (ref 4.22–5.81)
RDW: 15.5 % (ref 11.5–15.5)
WBC: 6.9 10*3/uL (ref 4.0–10.5)
nRBC: 0 % (ref 0.0–0.2)

## 2021-01-01 LAB — BASIC METABOLIC PANEL
Anion gap: 15 (ref 5–15)
BUN: 54 mg/dL — ABNORMAL HIGH (ref 8–23)
CO2: 28 mmol/L (ref 22–32)
Calcium: 7.9 mg/dL — ABNORMAL LOW (ref 8.9–10.3)
Chloride: 88 mmol/L — ABNORMAL LOW (ref 98–111)
Creatinine, Ser: 8.87 mg/dL — ABNORMAL HIGH (ref 0.61–1.24)
GFR, Estimated: 6 mL/min — ABNORMAL LOW (ref >60)
Glucose, Bld: 203 mg/dL — ABNORMAL HIGH (ref 70–99)
Potassium: 5.6 mmol/L — ABNORMAL HIGH (ref 3.5–5.1)
Sodium: 131 mmol/L — ABNORMAL LOW (ref 135–145)

## 2021-01-01 LAB — GLUCOSE, CAPILLARY
Glucose-Capillary: 108 mg/dL — ABNORMAL HIGH (ref 70–99)
Glucose-Capillary: 122 mg/dL — ABNORMAL HIGH (ref 70–99)
Glucose-Capillary: 140 mg/dL — ABNORMAL HIGH (ref 70–99)
Glucose-Capillary: 209 mg/dL — ABNORMAL HIGH (ref 70–99)
Glucose-Capillary: 241 mg/dL — ABNORMAL HIGH (ref 70–99)
Glucose-Capillary: 65 mg/dL — ABNORMAL LOW (ref 70–99)
Glucose-Capillary: 97 mg/dL (ref 70–99)

## 2021-01-01 LAB — CBC
HCT: 30.4 % — ABNORMAL LOW (ref 39.0–52.0)
Hemoglobin: 9.8 g/dL — ABNORMAL LOW (ref 13.0–17.0)
MCH: 32.7 pg (ref 26.0–34.0)
MCHC: 32.2 g/dL (ref 30.0–36.0)
MCV: 101.3 fL — ABNORMAL HIGH (ref 80.0–100.0)
Platelets: 130 10*3/uL — ABNORMAL LOW (ref 150–400)
RBC: 3 MIL/uL — ABNORMAL LOW (ref 4.22–5.81)
RDW: 15.1 % (ref 11.5–15.5)
WBC: 7.5 10*3/uL (ref 4.0–10.5)
nRBC: 0 % (ref 0.0–0.2)

## 2021-01-01 LAB — RESP PANEL BY RT-PCR (FLU A&B, COVID) ARPGX2
Influenza A by PCR: NEGATIVE
Influenza B by PCR: NEGATIVE
SARS Coronavirus 2 by RT PCR: NEGATIVE

## 2021-01-01 LAB — TROPONIN I (HIGH SENSITIVITY)
Troponin I (High Sensitivity): 1582 ng/L (ref <18)
Troponin I (High Sensitivity): 1673 ng/L (ref <18)

## 2021-01-01 LAB — MRSA PCR SCREENING: MRSA by PCR: POSITIVE — AB

## 2021-01-01 LAB — HIV ANTIBODY (ROUTINE TESTING W REFLEX): HIV Screen 4th Generation wRfx: NONREACTIVE

## 2021-01-01 LAB — BRAIN NATRIURETIC PEPTIDE: B Natriuretic Peptide: 3450.4 pg/mL — ABNORMAL HIGH (ref 0.0–100.0)

## 2021-01-01 MED ORDER — INSULIN GLARGINE 100 UNIT/ML ~~LOC~~ SOLN
5.0000 [IU] | Freq: Every day | SUBCUTANEOUS | Status: DC
  Administered 2021-01-03 – 2021-01-04 (×2): 5 [IU] via SUBCUTANEOUS
  Filled 2021-01-01 (×4): qty 0.05

## 2021-01-01 MED ORDER — ATORVASTATIN CALCIUM 80 MG PO TABS
80.0000 mg | ORAL_TABLET | Freq: Every day | ORAL | Status: DC
  Administered 2021-01-01 – 2021-01-04 (×3): 80 mg via ORAL
  Filled 2021-01-01 (×3): qty 1

## 2021-01-01 MED ORDER — DOXERCALCIFEROL 4 MCG/2ML IV SOLN
INTRAVENOUS | Status: AC
  Filled 2021-01-01: qty 2

## 2021-01-01 MED ORDER — HYDRALAZINE HCL 25 MG PO TABS: 25.0000 mg | ORAL_TABLET | Freq: Three times a day (TID) | ORAL | Status: DC

## 2021-01-01 MED ORDER — CINACALCET HCL 30 MG PO TABS
60.0000 mg | ORAL_TABLET | ORAL | Status: DC
  Filled 2021-01-01: qty 2

## 2021-01-01 MED ORDER — ONDANSETRON HCL 4 MG/2ML IJ SOLN: 4.0000 mg | Freq: Four times a day (QID) | INTRAMUSCULAR | Status: DC | PRN

## 2021-01-01 MED ORDER — HYDRALAZINE HCL 20 MG/ML IJ SOLN: 10.0000 mg | INTRAMUSCULAR | Status: DC | PRN

## 2021-01-01 MED ORDER — ISOSORBIDE MONONITRATE ER 60 MG PO TB24
60.0000 mg | ORAL_TABLET | Freq: Every day | ORAL | Status: DC
  Administered 2021-01-01 – 2021-01-04 (×3): 60 mg via ORAL
  Filled 2021-01-01 (×3): qty 1

## 2021-01-01 MED ORDER — HYDRALAZINE HCL 50 MG PO TABS
50.0000 mg | ORAL_TABLET | Freq: Three times a day (TID) | ORAL | Status: DC
  Administered 2021-01-01 – 2021-01-04 (×8): 50 mg via ORAL
  Filled 2021-01-01 (×8): qty 1

## 2021-01-01 MED ORDER — SODIUM CHLORIDE 0.9% FLUSH
3.0000 mL | Freq: Two times a day (BID) | INTRAVENOUS | Status: DC
  Administered 2021-01-01 – 2021-01-02 (×2): 3 mL via INTRAVENOUS

## 2021-01-01 MED ORDER — ACETAMINOPHEN 650 MG RE SUPP: 650.0000 mg | Freq: Four times a day (QID) | RECTAL | Status: DC | PRN

## 2021-01-01 MED ORDER — ONDANSETRON HCL 4 MG PO TABS: 4.0000 mg | ORAL_TABLET | Freq: Four times a day (QID) | ORAL | Status: DC | PRN

## 2021-01-01 MED ORDER — DOXERCALCIFEROL 4 MCG/2ML IV SOLN
2.0000 ug | INTRAVENOUS | Status: DC
  Administered 2021-01-01: 2 ug via INTRAVENOUS
  Filled 2021-01-01: qty 2

## 2021-01-01 MED ORDER — MUPIROCIN 2 % EX OINT
TOPICAL_OINTMENT | Freq: Two times a day (BID) | CUTANEOUS | Status: DC
  Administered 2021-01-01 – 2021-01-03 (×3): 1 via NASAL
  Filled 2021-01-01: qty 22

## 2021-01-01 MED ORDER — INSULIN PUMP: SUBCUTANEOUS | Status: DC

## 2021-01-01 MED ORDER — EZETIMIBE 10 MG PO TABS
10.0000 mg | ORAL_TABLET | Freq: Every day | ORAL | Status: DC
  Administered 2021-01-01 – 2021-01-04 (×3): 10 mg via ORAL
  Filled 2021-01-01 (×3): qty 1

## 2021-01-01 MED ORDER — ACETAMINOPHEN 325 MG PO TABS: 650.0000 mg | ORAL_TABLET | Freq: Four times a day (QID) | ORAL | Status: DC | PRN

## 2021-01-01 MED ORDER — GABAPENTIN 100 MG PO CAPS
100.0000 mg | ORAL_CAPSULE | Freq: Every day | ORAL | Status: DC
  Administered 2021-01-01 – 2021-01-03 (×2): 100 mg via ORAL
  Filled 2021-01-01 (×2): qty 1

## 2021-01-01 MED ORDER — INSULIN ASPART 100 UNIT/ML ~~LOC~~ SOLN
0.0000 [IU] | SUBCUTANEOUS | Status: DC
  Administered 2021-01-01: 3 [IU] via SUBCUTANEOUS
  Administered 2021-01-02 (×2): 1 [IU] via SUBCUTANEOUS
  Administered 2021-01-02 (×2): 5 [IU] via SUBCUTANEOUS
  Administered 2021-01-03: 1 [IU] via SUBCUTANEOUS
  Administered 2021-01-03: 7 [IU] via SUBCUTANEOUS
  Administered 2021-01-04: 3 [IU] via SUBCUTANEOUS

## 2021-01-01 MED ORDER — APIXABAN 5 MG PO TABS
5.0000 mg | ORAL_TABLET | Freq: Two times a day (BID) | ORAL | Status: DC
  Administered 2021-01-01 (×2): 5 mg via ORAL
  Filled 2021-01-01 (×2): qty 1

## 2021-01-01 MED ORDER — ALBUTEROL SULFATE (2.5 MG/3ML) 0.083% IN NEBU: 2.5000 mg | INHALATION_SOLUTION | Freq: Four times a day (QID) | RESPIRATORY_TRACT | Status: DC | PRN

## 2021-01-01 MED ORDER — SODIUM ZIRCONIUM CYCLOSILICATE 10 G PO PACK: 10.0000 g | PACK | Freq: Once | ORAL | Status: DC

## 2021-01-01 MED ORDER — CHLORHEXIDINE GLUCONATE CLOTH 2 % EX PADS
6.0000 | MEDICATED_PAD | Freq: Every day | CUTANEOUS | Status: DC
  Administered 2021-01-02 – 2021-01-04 (×3): 6 via TOPICAL

## 2021-01-01 MED ORDER — DIPHENHYDRAMINE HCL 50 MG/ML IJ SOLN
25.0000 mg | Freq: Once | INTRAMUSCULAR | Status: AC
  Administered 2021-01-01: 25 mg via INTRAVENOUS
  Filled 2021-01-01: qty 1

## 2021-01-01 MED ORDER — INSULIN PUMP
Freq: Three times a day (TID) | SUBCUTANEOUS | Status: DC
  Filled 2021-01-01: qty 1

## 2021-01-01 MED ORDER — CITALOPRAM HYDROBROMIDE 20 MG PO TABS
20.0000 mg | ORAL_TABLET | Freq: Every day | ORAL | Status: DC
  Administered 2021-01-01 – 2021-01-04 (×3): 20 mg via ORAL
  Filled 2021-01-01 (×3): qty 1

## 2021-01-01 MED ORDER — CALCIUM ACETATE (PHOS BINDER) 667 MG PO CAPS
667.0000 mg | ORAL_CAPSULE | Freq: Three times a day (TID) | ORAL | Status: DC
  Administered 2021-01-01 – 2021-01-04 (×6): 667 mg via ORAL
  Filled 2021-01-01 (×7): qty 1

## 2021-01-01 MED ORDER — ONDANSETRON HCL 4 MG/2ML IJ SOLN
4.0000 mg | Freq: Once | INTRAMUSCULAR | Status: AC
  Administered 2021-01-01: 4 mg via INTRAVENOUS
  Filled 2021-01-01: qty 2

## 2021-01-01 NOTE — ED Notes (Addendum)
[facility] at bedside to transport patient to [facility]. No acute distress noted.

## 2021-01-01 NOTE — ED Provider Notes (Signed)
[place] DEPT [facility] Provider Note: Georgena Spurling, MD, FACEP  CSN: [id number] MRN: [id number] ARRIVAL: 01/01/21 at [place]: [place]  Dizziness and Vomiting   HISTORY OF PRESENT ILLNESS  01/01/21 12:44 AM Kenneth [name] is a 65 y.o. male with end-stage renal disease on hemodialysis.  He was admitted to the hospital on 12/16/2020 for (hemoglobin 6.7) and symptomatic bradycardia (junctional rhythm).  He was discharged from the hospital on 12/25/2020 with multiple diagnoses.  (1) metabolic encephalopathy believed to be due to hypotension, improved by time of discharge.  (2) symptomatic bradycardia, improved by time of discharge.  He was discharged home on no antiarrhythmics or rate control medications.  (3) elevated troponin thought to be due to demand ischemia.  (4) GI bleed.  Patient was transfused with packed red blood cells during his course.  Eliquis was put on hold and a colonoscopy showed blood in the colon with no obvious site of bleeding.  He was discharged home with Eliquis resumed.  H&H at time of discharge were 8.3 and 24.8 respectively.  His wife, who is the primary historian (via interpreter line) states the patient has not gotten back to his usual state of health since being discharged.  He continues to be weak, short of breath (worse with exertion) and have a poor appetite.  For the past 2 days his shortness of breath has worsened and he has developed a cough productive of blood-streaked sputum.  He has had 1 day of dizziness by which he means he feels like he is moving even though the surroundings are not.  Symptoms are worse when he is standing but not when he moves his head.  He has had nausea with this and 2 episodes of vomiting which were blood-streaked.  His initial oxygen saturation on arrival was 94% on room air but on my assessment his oxygen saturation remains at 88% on room air.  He denies chest pain.  He was dialyzed, as scheduled, 2 days  ago.   Past Medical History:  Diagnosis Date  . Acute pulmonary edema ([place]) 05/31/2016  . Acute respiratory failure with hypoxemia ([place]) 05/31/2016  . CAD (coronary artery disease) 10/30/2016   NSTEMI 9/17 with CABG x 3 (LIMA-LAD, SVG-OM, SVG-RCA).   - NSTEMI 10/18 s/p DES to ostial SVG to  OM  . Depression   . Diabetic foot ulcer ([place]) 04/11/2017  . 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 ESRD (end stage renal disease) on dialysis [facility])    "MWF; [place]" (03/30/2018)  . Gastroesophageal reflux disease 08/18/2016  . HCAP (healthcare-associated pneumonia)   . Hemodialysis status ([place])   . Hyperlipidemia   . Hypertension   . Hypertensive heart disease 09/12/2017  . Ischemic rest pain of lower extremity 02/06/2015  . 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 NSTEMI (non-ST elevated myocardial infarction) ([place])   . PAF (paroxysmal atrial fibrillation) ([place])   . Pleural effusion   . Pronation deformity of both feet 02/27/2015    Past Surgical History:  Procedure Laterality Date  . AV FISTULA PLACEMENT Left 06/22/2016   Procedure: ARTERIOVENOUS (AV) FISTULA CREATION LEFT UPPER ARM;  Surgeon: Rosetta Posner, MD;  Location: [place];  Service: Vascular;  Laterality: Left;  . BIOPSY  12/19/2020   Procedure: BIOPSY;  Surgeon: Ladene Artist, MD;  Location: [place];  Service: Endoscopy;;  . CARDIAC CATHETERIZATION N/A 05/31/2016   Procedure: Left Heart Cath and Coronary Angiography;  Surgeon: Jettie Booze, MD;  Location: [place] CV LAB;  Service: Cardiovascular;  Laterality: N/A;  . CARDIAC CATHETERIZATION N/A 05/31/2016   Procedure: Right Heart Cath;  Surgeon: Jettie Booze, MD;  Location: [place] CV LAB;  Service: Cardiovascular;  Laterality: N/A;  . CARDIAC CATHETERIZATION N/A 05/31/2016   Procedure: IABP Insertion;  Surgeon: Jettie Booze, MD;  Location: [place] CV LAB;  Service: Cardiovascular;  Laterality: N/A;  . COLONOSCOPY  WITH PROPOFOL N/A 12/20/2020   Procedure: COLONOSCOPY WITH PROPOFOL;  Surgeon: Mauri Pole, MD;  Location: [place] ENDOSCOPY;  Service: Endoscopy;  Laterality: N/A;  . CORONARY ARTERY BYPASS GRAFT N/A 06/05/2016   Procedure: CORONARY ARTERY BYPASS GRAFTING (CABG) x3 LIMA to LAD -SVG to OM -SVG to RCA;  Surgeon: Ivin Poot, MD;  Location: [place];  Service: Open Heart Surgery;  Laterality: N/A;  . CORONARY STENT INTERVENTION N/A 07/07/2017   Procedure: CORONARY STENT INTERVENTION;  Surgeon: Wellington Hampshire, MD;  Location: [place] CV LAB;  Service: Cardiovascular;  Laterality: N/A;  . CORONARY STENT INTERVENTION N/A 03/30/2018   Procedure: CORONARY STENT INTERVENTION;  Surgeon: Martinique, Peter M, MD;  Location: [place] CV LAB;  Service: Cardiovascular;  Laterality: N/A;  . ESOPHAGOGASTRODUODENOSCOPY (EGD) WITH PROPOFOL N/A 12/19/2020   Procedure: ESOPHAGOGASTRODUODENOSCOPY (EGD) WITH PROPOFOL;  Surgeon: Ladene Artist, MD;  Location: [facility] ENDOSCOPY;  Service: Endoscopy;  Laterality: N/A;  . GIVENS CAPSULE STUDY N/A 12/20/2020   Procedure: GIVENS CAPSULE STUDY;  Surgeon: Mauri Pole, MD;  Location: [place];  Service: Endoscopy;  Laterality: N/A;  . INSERTION OF DIALYSIS CATHETER N/A 06/05/2016   Procedure: INSERTION OF DIALYSIS/trialysis CATHETER;  Surgeon: Ivin Poot, MD;  Location: [place];  Service: Vascular;  Laterality: N/A;  . INSERTION OF DIALYSIS CATHETER Right 06/13/2016   Procedure: INSERTION OF DIALYSIS CATHETER RIGHT INTERNAL JUGULAR;  Surgeon: Conrad Goldonna, MD;  Location: [place];  Service: Vascular;  Laterality: Right;  . INSERTION OF DIALYSIS CATHETER Right 09/07/2019   Procedure: Insertion Of Dialysis Catheter;  Surgeon: Rosetta Posner, MD;  Location: [facility] OR;  Service: Vascular;  Laterality: Right;  . INTRAOPERATIVE TRANSESOPHAGEAL ECHOCARDIOGRAM N/A 06/05/2016   Procedure: INTRAOPERATIVE TRANSESOPHAGEAL ECHOCARDIOGRAM;  Surgeon: Ivin Poot, MD;  Location: [place];   Service: Open Heart Surgery;  Laterality: N/A;  . LEFT HEART CATH AND CORS/GRAFTS ANGIOGRAPHY N/A 11/03/2016   Procedure: Left Heart Cath and Cors/Grafts Angiography;  Surgeon: Troy Sine, MD;  Location: [place] CV LAB;  Service: Cardiovascular;  Laterality: N/A;  . LEFT HEART CATH AND CORS/GRAFTS ANGIOGRAPHY N/A 07/07/2017   Procedure: LEFT HEART CATH AND CORS/GRAFTS ANGIOGRAPHY;  Surgeon: Larey Dresser, MD;  Location: [place] CV LAB;  Service: Cardiovascular;  Laterality: N/A;  . LEFT HEART CATH AND CORS/GRAFTS ANGIOGRAPHY N/A 03/30/2018   Procedure: LEFT HEART CATH AND CORS/GRAFTS ANGIOGRAPHY;  Surgeon: Martinique, Peter M, MD;  Location: [place] CV LAB;  Service: Cardiovascular;  Laterality: N/A;  . LEFT HEART CATH AND CORS/GRAFTS ANGIOGRAPHY N/A 04/17/2019   Procedure: LEFT HEART CATH AND CORS/GRAFTS ANGIOGRAPHY;  Surgeon: Larey Dresser, MD;  Location: [place] CV LAB;  Service: Cardiovascular;  Laterality: N/A;  . POLYPECTOMY  12/20/2020   Procedure: POLYPECTOMY;  Surgeon: Mauri Pole, MD;  Location: [place];  Service: Endoscopy;;  . REVISION OF ARTERIOVENOUS GORETEX GRAFT Left [id number]   Procedure: PLICATION OF ANEURYSM OF ARTERIOVENOUS FISTULA  LEFT ARM;  Surgeon: Rosetta Posner, MD;  Location: [facility]  OR;  Service: Vascular;  Laterality: Left;  . REVISON OF ARTERIOVENOUS FISTULA Left 09/07/2019   Procedure: REVISON OF LEFT UPPER ARM ARTERIOVENOUS FISTULA;  Surgeon: Rosetta Posner, MD;  Location: [facility] OR;  Service: Vascular;  Laterality: Left;    Family History  Problem Relation Age of Onset  . CAD Father   . Colon cancer Father   . Diabetes Brother     Social History   Tobacco Use  . Smoking status: Former Smoker    Packs/day: 1.50    Years: 35.00    Pack years: 52.50    Types: Cigarettes    Quit date: 2014    Years since quitting: 8.3  . Smokeless tobacco: Never Used  . Tobacco comment: "quit e-cigs ~ 2014"  Vaping Use  . Vaping Use: Former   Substance Use Topics  . Alcohol use: Never    Alcohol/week: 0.0 standard drinks  . Drug use: Never    Prior to Admission medications   Medication Sig Start Date End Date Taking? Authorizing Provider  acetaminophen (TYLENOL) 500 MG tablet Take 1,000 mg by mouth every 6 (six) hours as needed for mild pain or headache.     [provider]  Ascorbic Acid (VITAMIN C) 1000 MG tablet Take 1,000 mg by mouth daily.    [provider]  atorvastatin (LIPITOR) 80 MG tablet Take 1 tablet by mouth once daily Patient taking differently: Take 80 mg by mouth daily before lunch. 12/22/19   Larey Dresser, MD  calcium acetate (PHOSLO) 667 MG capsule Take 667 mg by mouth 3 (three) times daily with meals.  07/09/17   [provider]  citalopram (CELEXA) 20 MG tablet Take 20 mg by mouth daily with lunch.  02/25/17   [provider]  ELIQUIS 5 MG TABS tablet Take 1 tablet by mouth twice daily 02/06/20   Larey Dresser, MD  ezetimibe (ZETIA) 10 MG tablet Take 1 tablet by mouth once daily 02/06/20   Larey Dresser, MD  gabapentin (NEURONTIN) 100 MG capsule Take 100 mg by mouth daily with lunch.  02/11/17   [provider]  Insulin Human (INSULIN PUMP) SOLN Inject into the skin continuous. NOVOLOG    [provider]  isosorbide mononitrate (IMDUR) 60 MG 24 hr tablet Take 1 tablet by mouth once daily 05/27/20   Larey Dresser, MD  NOVOLOG 100 UNIT/ML injection Inject 6-10 Units into the skin See admin instructions. Starts with 10 units per day and adds 6 units with each meal via pump 01/08/19   [provider]  pantoprazole (PROTONIX) 40 MG tablet Take 40 mg by mouth daily before lunch.  01/07/17   [provider]  ranolazine (RANEXA) 500 MG 12 hr tablet Take 1 tablet (500 mg total) by mouth 2 (two) times daily. 12/25/20 01/24/21  Barb Merino, MD    Allergies Patient has no known allergies.   REVIEW OF SYSTEMS  Negative except as noted here  or in the History of Present Illness.   PHYSICAL EXAMINATION  Initial Vital Signs Blood pressure (!) 162/55, pulse 62, temperature 98.1 F (36.7 C), temperature source Oral, resp. rate 18, height '5\' 7"'$  (1.702 m), weight 66.7 kg, SpO2 94 %.  Examination General: Well-developed, well-nourished male in no acute distress; appearance consistent with age of record HENT: normocephalic; atraumatic Eyes: Pupils round but unequal and sluggish; bilateral pseudophakia; extraocular muscles grossly intact; nystagmus-like movement of eyes but movement is not regular Neck: supple Heart: regular rate and  rhythm Lungs: Few faint rales throughout Abdomen: soft; nondistended; nontender; bowel sounds present Extremities: No deformity; full range of motion; pulses normal Neurologic: Awake, alert; motor function intact in all extremities and symmetric; no facial droop Skin: Warm and dry Psychiatric: Flat affect   RESULTS  Summary of this visit's results, reviewed and interpreted by myself:   EKG Interpretation  Date/Time:  Wednesday January 01 2021 00:30:30 EDT Ventricular Rate:  62 PR Interval:  225 QRS Duration: 142 QT Interval:  533 QTC Calculation: 542 R Axis:   107 Text Interpretation: Sinus rhythm Prolonged PR interval Consider left ventricular hypertrophy Repol abnrm suggests ischemia, diffuse leads Prolonged QT interval No significant change was found Confirmed by Shanon Rosser [phone]) on 01/01/2021 12:44:46 AM     EKG shows ST elevations and depressions along with T wave inversions but these are not significantly changed from his most recent prior EKG.  Laboratory Studies: Results for orders placed or performed during the hospital encounter of 01/01/21 (from the past 24 hour(s))  CBC with Differential/Platelet     Status: Abnormal   Collection Time: 01/01/21 12:45 AM  Result Value Ref Range   WBC 6.9 4.0 - 10.5 K/uL   RBC 2.89 (L) 4.22 - 5.81 MIL/uL   Hemoglobin 9.3 (L) 13.0 - 17.0 g/dL    HCT 28.9 (L) 39.0 - 52.0 %   MCV 100.0 80.0 - 100.0 fL   MCH 32.2 26.0 - 34.0 pg   MCHC 32.2 30.0 - 36.0 g/dL   RDW 15.5 11.5 - 15.5 %   Platelets 122 (L) 150 - 400 K/uL   nRBC 0.0 0.0 - 0.2 %   Neutrophils Relative % 76 %   Neutro Abs 5.3 1.7 - 7.7 K/uL   Lymphocytes Relative 10 %   Lymphs Abs 0.7 0.7 - 4.0 K/uL   Monocytes Relative 12 %   Monocytes Absolute 0.8 0.1 - 1.0 K/uL   Eosinophils Relative 1 %   Eosinophils Absolute 0.1 0.0 - 0.5 K/uL   Basophils Relative 1 %   Basophils Absolute 0.1 0.0 - 0.1 K/uL   Immature Granulocytes 0 %   Abs Immature Granulocytes 0.02 0.00 - 0.07 K/uL  Basic metabolic panel     Status: Abnormal   Collection Time: 01/01/21 12:45 AM  Result Value Ref Range   Sodium 131 (L) 135 - 145 mmol/L   Potassium 5.6 (H) 3.5 - 5.1 mmol/L   Chloride 88 (L) 98 - 111 mmol/L   CO2 28 22 - 32 mmol/L   Glucose, Bld 203 (H) 70 - 99 mg/dL   BUN 54 (H) 8 - 23 mg/dL   Creatinine, Ser 8.87 (H) 0.61 - 1.24 mg/dL   Calcium 7.9 (L) 8.9 - 10.3 mg/dL   GFR, Estimated 6 (L) >60 mL/min   Anion gap 15 5 - 15  Troponin I (High Sensitivity)     Status: Abnormal   Collection Time: 01/01/21 12:45 AM  Result Value Ref Range   Troponin I (High Sensitivity) 1,582 (HH) <18 ng/L   Imaging Studies: DG Chest 2 View  Result Date: 01/01/2021 CLINICAL DATA:  Hemoptysis EXAM: CHEST - 2 VIEW COMPARISON:  12/22/2020 FINDINGS: Mild cardiomegaly with calcific aortic atherosclerosis. Mild bilateral parahilar interstitial opacity without overt pulmonary edema. No pleural effusion or pneumothorax. Left subclavian stent. IMPRESSION: Mild cardiomegaly without overt pulmonary edema Electronically Signed   By: Ulyses Jarred M.D.   On: 01/01/2021 01:23    ED COURSE and MDM  Nursing notes, initial and subsequent vitals  signs, including pulse oximetry, reviewed and interpreted by myself.  Vitals:   01/01/21 0026 01/01/21 0027 01/01/21 0157  BP: (!) 162/55  (!) 160/61  Pulse: 62  (!) 48   Resp: 18  18  Temp: 98.1 F (36.7 C)    TempSrc: Oral    SpO2: 94%  91%  Weight:  66.7 kg   Height:  '5\' 7"'$  (1.702 m)    Medications  ondansetron (ZOFRAN) injection 4 mg (4 mg Intravenous Given 01/01/21 0056)  diphenhydrAMINE (BENADRYL) injection 25 mg (25 mg Intravenous Given 01/01/21 0057)   3:02 AM Dizziness improved but not abated after IV Benadryl and Zofran.  Nystagmus like eye movement still present.  Oxygen saturation still in mid to high 80s on room air.  Troponin was significantly more elevated than it was while an inpatient.  We will have patient admitted.  4:04 AM Dr. Marlowe Sax to admit to hospitalist service.  Differential diagnosis for his elevated troponin includes demand ischemia due to volume overload but the patient states he has not had significant oral intake and his chest x-ray has not shown overt pulmonary edema.  He could have a pulmonary embolism but he is already on Eliquis and he is not tachycardic.   PROCEDURES  Procedures CRITICAL CARE Performed by: Karen Chafe Kosisochukwu Goldberg Total critical care time: 30 minutes Critical care time was exclusive of separately billable procedures and treating other patients. Critical care was necessary to treat or prevent imminent or life-threatening deterioration. Critical care was time spent personally by me on the following activities: development of treatment plan with patient and/or surrogate as well as nursing, discussions with consultants, evaluation of patient's response to treatment, examination of patient, obtaining history from patient or surrogate, ordering and performing treatments and interventions, ordering and review of laboratory studies, ordering and review of radiographic studies, pulse oximetry and re-evaluation of patient's condition.   ED DIAGNOSES     ICD-10-CM   1. Hypoxia  R09.02   2. Dizziness  R42   3. Dyspnea on exertion  R06.00   4. Elevated troponin I level  R77.8   5. Hyperkalemia  E87.5   6. Generalized  weakness  R53.1   7. Poor appetite  R63.0        Tayllor Breitenstein, Jenny Reichmann, MD 01/01/21 [phone]

## 2021-01-01 NOTE — Consult Note (Addendum)
Cardiology Consultation:   Patient ID: Kenneth [name] MRN: [id number]; DOB: 10/11/1955  Admit date: 01/01/2021 Date of Consult: 01/01/2021  PCP:  [name], MD   [place]  Cardiologist:  No primary care provider on file. Dr. Aundra Dubin Advanced Practice Provider:  No care team member to display Electrophysiologist:  None  Advanced [facility] Failure Clinic:  Loralie Champagne, MD        Patient Profile:   Kenneth MOUDRY is a 65 y.o. male with a hx of DM-1 on insulin pump, ESRD with HD MWF, CAD, chronic systolic CHF, NSTEMI in [id number] 3v dz and then CABG X 3 and in 2018 graft stenosis with DES to VG-OM1 and restenosis in 2019 with DES X 2 to native OM1, last cath 2020 with occluded VG to RCA and VG to ramus and patent LIMA to LAD. Large ramus was patent with 40-50% mid ramus stenosis, in tent restenosis)  80% ostial stenosis very small caliber AV Lcx occluded RCA chronic with collaterals.  PAF   Hx cardiogenic shock on milrinone with hospitalization, non compliance  and weaned off who is being seen today for the evaluation of CHF and elevated troponins at the request of Dr. Tamala Julian.  History of Present Illness:    Prior cardiac hx of NSTEMI 2017 and significant CAD undergoing CABG X 3 LIMA to LAD, VG to RCA, VG to OM.  In 2018 graft dysfunction with DES to VG to OM1, 2019 restenosis and 2 DES to native OM1,  (also chronic occlusion of VG to RCA, ast cath 2020 with occluded VG to RCA and VG to ramus and patent LIMA to LAD. Large ramus was patent with 40-50% mid ramus stenosis, in stent restenosis  80% ostial stenosis very small caliber AV Lcx occluded RCA chronic with collaterals.  cardiolite 3/20 no ischemia.  Mr. Proa with hx as above was last seen by Dr. Aundra Dubin 11/28/20 EKG stable.  Pt complained of  intermittent sharp atypical chest pain, EKG stable, residual lower ext edema post HD, ReDs clip was  mildly elevated at 36% (20-35% is normal) he had slow healing LE wound 5th  digit of Rt foot.   Nuclear stress test was ordered but not yet done.  Echo on 11/28/20  With EF 55-60%, no RWMA.  Moderate LVH. G2 DD.   With foot ulcer ABIs were normal.  Hx of 1-39% rt and lt carotid stenosis Rt subclavian artery was stenotic.    Pt was hospitalized 12/15/20 to 12/25/20 with chest pain and bloody stool, HR in the 40s and junctional rhythm Pt was poorly responsive.at the time.   Atropine was given with improvement.  Hgb was 6.8.; patient transfused 1 unit of blood K+ 6.2 glucose 345. Hs troponin 44 to 745 . BB was stopped. With GI bleed and high risk of recurrent GI bleed and unknown source (colonscopy was done with blood in colon but no source of bleeding,  EGD showed  post ulcer deformity in duodenal bulb ) Because of this it was decided on medical management of CAD and NSTEMI.   He was transfused and placed on imdur and ranexa.  He was able to resume eliquis. Pan was to follow up with Dr. Aundra Dubin in May.   Today pt presented with dizziness, emesis, nausea X 1 day and emesis with blood. Pt's wife notes he has not gotten back to usual state of health. He is still SOB with exertion.  sp02 on RA 94% but then  dropped to 88%. Placed on 02 and then to dialysis.   EKG:  The EKG was personally reviewed and demonstrates:  SR at 55 with LVH, qtc at 542 prolonged T wave inversion in V5-6 - similar to prior findings except prolonged Qtc.  ranexa was stopped.  Telemetry:  Telemetry was personally reviewed and demonstrates:  SR  Tests significant for BNP 3450, hs troponin 1582 then 1673  Na 131, K+ 5.6 BUN 54 Cr 8.87 gap 15  hgb 9.3 improved from 12/25/20 of 8.3  WBC 6.9  COVID negative   2V CXR IMPRESSION: Mild cardiomegaly without overt pulmonary edema    Pt is currently having HD now no chest pain or SOB resting comfortably.  Continues on eliquis, lipitor, zetia and apresoline, imdur      Past Medical History:  Diagnosis Date  . Acute respiratory failure with hypoxemia ([place]) 05/31/2016   . CAD (coronary artery disease) 10/30/2016   NSTEMI 9/17 with CABG x 3 (LIMA-LAD, SVG-OM, SVG-RCA).   - NSTEMI 10/18 s/p DES to ostial SVG to  OM  . Depression   . Diabetic foot ulcer ([place]) 04/11/2017  . 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 ESRD on hemodialysis [facility])    "MWF; [place]" (03/30/2018)  . Gastroesophageal reflux disease 08/18/2016  . HCAP (healthcare-associated pneumonia)   . Hyperlipidemia   . Hypertension   . Hypertensive heart disease 09/12/2017  . Ischemic rest pain of lower extremity 02/06/2015  . 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 NSTEMI (non-ST elevated myocardial infarction) ([place])   . PAF (paroxysmal atrial fibrillation) ([place])   . Pleural effusion   . Pronation deformity of both feet 02/27/2015    Past Surgical History:  Procedure Laterality Date  . AV FISTULA PLACEMENT Left 06/22/2016   Procedure: ARTERIOVENOUS (AV) FISTULA CREATION LEFT UPPER ARM;  Surgeon: Rosetta Posner, MD;  Location: [place];  Service: Vascular;  Laterality: Left;  . BIOPSY  12/19/2020   Procedure: BIOPSY;  Surgeon: Ladene Artist, MD;  Location: [place];  Service: Endoscopy;;  . CARDIAC CATHETERIZATION N/A 05/31/2016   Procedure: Left Heart Cath and Coronary Angiography;  Surgeon: Jettie Booze, MD;  Location: [place] CV LAB;  Service: Cardiovascular;  Laterality: N/A;  . CARDIAC CATHETERIZATION N/A 05/31/2016   Procedure: Right Heart Cath;  Surgeon: Jettie Booze, MD;  Location: [place] CV LAB;  Service: Cardiovascular;  Laterality: N/A;  . CARDIAC CATHETERIZATION N/A 05/31/2016   Procedure: IABP Insertion;  Surgeon: Jettie Booze, MD;  Location: [place] CV LAB;  Service: Cardiovascular;  Laterality: N/A;  . COLONOSCOPY WITH PROPOFOL N/A 12/20/2020   Procedure: COLONOSCOPY WITH PROPOFOL;  Surgeon: Mauri Pole, MD;  Location: [place] ENDOSCOPY;  Service: Endoscopy;  Laterality: N/A;  . CORONARY ARTERY BYPASS GRAFT N/A  06/05/2016   Procedure: CORONARY ARTERY BYPASS GRAFTING (CABG) x3 LIMA to LAD -SVG to OM -SVG to RCA;  Surgeon: Ivin Poot, MD;  Location: [place];  Service: Open Heart Surgery;  Laterality: N/A;  . CORONARY STENT INTERVENTION N/A 07/07/2017   Procedure: CORONARY STENT INTERVENTION;  Surgeon: Wellington Hampshire, MD;  Location: [place] CV LAB;  Service: Cardiovascular;  Laterality: N/A;  . CORONARY STENT INTERVENTION N/A 03/30/2018   Procedure: CORONARY STENT INTERVENTION;  Surgeon: Martinique, Peter M, MD;  Location: [place] CV LAB;  Service: Cardiovascular;  Laterality: N/A;  . ESOPHAGOGASTRODUODENOSCOPY (EGD) WITH PROPOFOL N/A 12/19/2020   Procedure: ESOPHAGOGASTRODUODENOSCOPY (EGD) WITH PROPOFOL;  Surgeon: Ladene Artist, MD;  Location: [facility] ENDOSCOPY;  Service: Endoscopy;  Laterality: N/A;  . GIVENS CAPSULE STUDY N/A 12/20/2020   Procedure: GIVENS CAPSULE STUDY;  Surgeon: Mauri Pole, MD;  Location: [place];  Service: Endoscopy;  Laterality: N/A;  . INSERTION OF DIALYSIS CATHETER N/A 06/05/2016   Procedure: INSERTION OF DIALYSIS/trialysis CATHETER;  Surgeon: Ivin Poot, MD;  Location: [place];  Service: Vascular;  Laterality: N/A;  . INSERTION OF DIALYSIS CATHETER Right 06/13/2016   Procedure: INSERTION OF DIALYSIS CATHETER RIGHT INTERNAL JUGULAR;  Surgeon: Conrad Tetherow, MD;  Location: [place];  Service: Vascular;  Laterality: Right;  . INSERTION OF DIALYSIS CATHETER Right 09/07/2019   Procedure: Insertion Of Dialysis Catheter;  Surgeon: Rosetta Posner, MD;  Location: [facility] OR;  Service: Vascular;  Laterality: Right;  . INTRAOPERATIVE TRANSESOPHAGEAL ECHOCARDIOGRAM N/A 06/05/2016   Procedure: INTRAOPERATIVE TRANSESOPHAGEAL ECHOCARDIOGRAM;  Surgeon: Ivin Poot, MD;  Location: [place];  Service: Open Heart Surgery;  Laterality: N/A;  . LEFT HEART CATH AND CORS/GRAFTS ANGIOGRAPHY N/A 11/03/2016   Procedure: Left Heart Cath and Cors/Grafts Angiography;  Surgeon: Troy Sine, MD;   Location: [place] CV LAB;  Service: Cardiovascular;  Laterality: N/A;  . LEFT HEART CATH AND CORS/GRAFTS ANGIOGRAPHY N/A 07/07/2017   Procedure: LEFT HEART CATH AND CORS/GRAFTS ANGIOGRAPHY;  Surgeon: Larey Dresser, MD;  Location: [place] CV LAB;  Service: Cardiovascular;  Laterality: N/A;  . LEFT HEART CATH AND CORS/GRAFTS ANGIOGRAPHY N/A 03/30/2018   Procedure: LEFT HEART CATH AND CORS/GRAFTS ANGIOGRAPHY;  Surgeon: Martinique, Peter M, MD;  Location: [place] CV LAB;  Service: Cardiovascular;  Laterality: N/A;  . LEFT HEART CATH AND CORS/GRAFTS ANGIOGRAPHY N/A 04/17/2019   Procedure: LEFT HEART CATH AND CORS/GRAFTS ANGIOGRAPHY;  Surgeon: Larey Dresser, MD;  Location: [place] CV LAB;  Service: Cardiovascular;  Laterality: N/A;  . POLYPECTOMY  12/20/2020   Procedure: POLYPECTOMY;  Surgeon: Mauri Pole, MD;  Location: [place];  Service: Endoscopy;;  . REVISION OF ARTERIOVENOUS GORETEX GRAFT Left [id number]   Procedure: PLICATION OF ANEURYSM OF ARTERIOVENOUS FISTULA  LEFT ARM;  Surgeon: Rosetta Posner, MD;  Location: [place];  Service: Vascular;  Laterality: Left;  . REVISON OF ARTERIOVENOUS FISTULA Left 09/07/2019   Procedure: REVISON OF LEFT UPPER ARM ARTERIOVENOUS FISTULA;  Surgeon: Rosetta Posner, MD;  Location: [facility] OR;  Service: Vascular;  Laterality: Left;     Home Medications:  Prior to Admission medications   Medication Sig Start Date End Date Taking? Authorizing Provider  acetaminophen (TYLENOL) 500 MG tablet Take 1,000 mg by mouth every 6 (six) hours as needed for mild pain or headache.    Yes [provider]  Ascorbic Acid (VITAMIN C) 1000 MG tablet Take 1,000 mg by mouth daily.   Yes [provider]  atorvastatin (LIPITOR) 80 MG tablet Take 1 tablet by mouth once daily Patient taking differently: Take 80 mg by mouth daily before lunch. 12/22/19  Yes Larey Dresser, MD  calcium acetate (PHOSLO) 667 MG capsule Take 667 mg by mouth 3 (three) times  daily with meals.  07/09/17  Yes [provider]  citalopram (CELEXA) 20 MG tablet Take 20 mg by mouth daily with lunch.  02/25/17  Yes [provider]  ELIQUIS 5 MG TABS tablet Take 1 tablet by mouth twice daily Patient taking differently: Take 5 mg by mouth 2 (two) times daily. 02/06/20  Yes Larey Dresser, MD  ezetimibe (ZETIA) 10 MG tablet  Take 1 tablet by mouth once daily Patient taking differently: Take 10 mg by mouth daily. 02/06/20  Yes Larey Dresser, MD  gabapentin (NEURONTIN) 100 MG capsule Take 100 mg by mouth daily with lunch.  02/11/17  Yes [provider]  Insulin Human (INSULIN PUMP) SOLN Inject into the skin continuous. NOVOLOG   Yes [provider]  isosorbide mononitrate (IMDUR) 60 MG 24 hr tablet Take 1 tablet by mouth once daily Patient taking differently: Take 60 mg by mouth daily. 05/27/20  Yes Larey Dresser, MD  NOVOLOG 100 UNIT/ML injection Inject 6-10 Units into the skin See admin instructions. Starts with 10 units per day and adds 6 units with each meal via pump 01/08/19  Yes [provider]  pantoprazole (PROTONIX) 40 MG tablet Take 40 mg by mouth daily before lunch.  01/07/17  Yes [provider]  ranolazine (RANEXA) 500 MG 12 hr tablet Take 1 tablet (500 mg total) by mouth 2 (two) times daily. 12/25/20 01/24/21 Yes Barb Merino, MD    Inpatient Medications: Scheduled Meds: . apixaban  5 mg Oral BID  . atorvastatin  80 mg Oral QAC lunch  . calcium acetate  667 mg Oral TID WC  . Chlorhexidine Gluconate Cloth  6 each Topical Q0600  . [START ON 01/03/2021] cinacalcet  60 mg Oral Q M,W,F-HD  . citalopram  20 mg Oral Q lunch  . [START ON 01/03/2021] doxercalciferol  2 mcg Intravenous Q M,W,F-HD  . ezetimibe  10 mg Oral Daily  . gabapentin  100 mg Oral Q lunch  . hydrALAZINE  50 mg Oral Q8H  . insulin aspart  0-9 Units Subcutaneous Q4H  . insulin glargine  5 Units Subcutaneous Daily  . isosorbide mononitrate  60  mg Oral Daily  . mupirocin ointment   Nasal BID  . sodium chloride flush  3 mL Intravenous Q12H   Continuous Infusions:  PRN Meds: acetaminophen **OR** acetaminophen, albuterol, hydrALAZINE  Allergies:   No Known Allergies  Social History:   Social History   Socioeconomic History  . Marital status: Married    Spouse name: Not on file  . Number of children: Not on file  . Years of education: Not on file  . Highest education level: Not on file  Occupational History  . Not on file  Tobacco Use  . Smoking status: Former Smoker    Packs/day: 1.50    Years: 35.00    Pack years: 52.50    Types: Cigarettes    Quit date: 2014    Years since quitting: 8.3  . Smokeless tobacco: Never Used  . Tobacco comment: "quit e-cigs ~ 2014"  Vaping Use  . Vaping Use: Former  Substance and Sexual Activity  . Alcohol use: Never    Alcohol/week: 0.0 standard drinks  . Drug use: Never  . Sexual activity: Not on file  Other Topics Concern  . Not on file  Social History Narrative  . Not on file   Social Determinants of Health   Financial Resource Strain: Not on file  Food Insecurity: Not on file  Transportation Needs: Not on file  Physical Activity: Not on file  Stress: Not on file  Social Connections: Not on file  Intimate Partner Violence: Not on file    Family History:    Family History  Problem Relation Age of Onset  . CAD Father   . Colon cancer Father   . Diabetes Brother      ROS:  Please see  the history of present illness.  General:no colds or fevers, no weight changes Skin:no rashes or ulcers HEENT:no blurred vision, no congestion CV:see HPI PUL:see HPI GI:no diarrhea constipation or melena, no indigestion GU:no hematuria, no dysuria MS:no joint pain, no claudication Neuro:no syncope, no lightheadedness Endo:+ diabetes type 1 , no thyroid disease  All other ROS reviewed and negative.     Physical Exam/Data:   Vitals:   01/01/21 1405 01/01/21 1447 01/01/21  1500 01/01/21 1530  BP: (!) 182/58 (!) 179/66 (!) 193/75 (!) 187/83  Pulse: 60 63 66 69  Resp: (!) 24     Temp:      TempSrc:      SpO2:      Weight:      Height:        Intake/Output Summary (Last 24 hours) at 01/01/2021 1536 Last data filed at 01/01/2021 1200 Gross per 24 hour  Intake 240 ml  Output --  Net 240 ml   Last 3 Weights 01/01/2021 01/01/2021 12/25/2020  Weight (lbs) 142 lb 6.7 oz 147 lb 0.8 oz 146 lb 13.2 oz  Weight (kg) 64.6 kg 66.7 kg 66.6 kg     Body mass index is 22.31 kg/m.  General:  Well nourished, well developed, in no acute distress HEENT: normal Lymph: no adenopathy Neck: no JVD Endocrine:  No thryomegaly Vascular: No carotid bruits; pedal pulses 1+ bilaterally  Connected to HD. Cardiac:  normal S1, S2; RRR; no murmur gallup rub or click Lungs:  clear to auscultation bilaterally, no wheezing, rhonchi or rales  Abd: soft, nontender, no hepatomegaly  Ext: no edema Musculoskeletal:  No deformities, BUE and BLE strength normal and equal Skin: warm and dry  Neuro:  Alert and oriented X 3 MAE follows commands, no focal abnormalities noted Psych:  Normal affect   Relevant CV Studies: Echo 11/28/2020 IMPRESSIONS    1. Left ventricular ejection fraction, by estimation, is 55 to 60%. The  left ventricle has normal function. The left ventricle has no regional  wall motion abnormalities. There is moderate left ventricular hypertrophy.  Left ventricular diastolic  parameters are consistent with Grade II diastolic dysfunction  (pseudonormalization). Elevated left atrial pressure.  2. Right ventricular systolic function is normal. The right ventricular  size is normal.  3. The mitral valve is normal in structure. Trivial mitral valve  regurgitation. No evidence of mitral stenosis.  4. The aortic valve is tricuspid. Aortic valve regurgitation is trivial.  Mild aortic valve sclerosis is present, with no evidence of aortic valve  stenosis.  5. The inferior  vena cava is normal in size with greater than 50%  respiratory variability, suggesting right atrial pressure of 3 mmHg.   FINDINGS  Left Ventricle: Left ventricular ejection fraction, by estimation, is 55  to 60%. The left ventricle has normal function. The left ventricle has no  regional wall motion abnormalities. The left ventricular internal cavity  size was normal in size. There is  moderate left ventricular hypertrophy. Left ventricular diastolic  parameters are consistent with Grade II diastolic dysfunction  (pseudonormalization). Elevated left atrial pressure.   Right Ventricle: The right ventricular size is normal. Right ventricular  systolic function is normal.   Left Atrium: Left atrial size was normal in size.   Right Atrium: Right atrial size was normal in size.   Pericardium: There is no evidence of pericardial effusion.   Mitral Valve: The mitral valve is normal in structure. Trivial mitral  valve regurgitation. No evidence of mitral valve stenosis.  Tricuspid Valve: The tricuspid valve is normal in structure. Tricuspid  valve regurgitation is trivial. No evidence of tricuspid stenosis.   Aortic Valve: The aortic valve is tricuspid. Aortic valve regurgitation is  trivial. Mild aortic valve sclerosis is present, with no evidence of  aortic valve stenosis.   Pulmonic Valve: The pulmonic valve was normal in structure. Pulmonic valve  regurgitation is not visualized. No evidence of pulmonic stenosis.   Aorta: The aortic root is normal in size and structure.   Venous: The inferior vena cava is normal in size with greater than 50%  respiratory variability, suggesting right atrial pressure of 3 mmHg.   IAS/Shunts: No atrial level shunt detected by color flow Doppler.      Cath 04/17/2019 Left Anterior Descending  90% proximal LAD stenosis, subtotal occlusion of the mid LAD. LIMA-LAD is patent.  Ramus Intermedius  Large ramus with long stented segment in the  proximal to mid vessel. 40-50% in-stent restenosis in the distal stented segment. Totally occluded SVG-ramus.  Left Circumflex  The AV LCx is small in caliber, 80% ostial stenosis.  Right Coronary Artery  Occluded proximal RCA. The SVG-RCA is known to be occluded from prior caths and was not injected. There were extensive left to right collaterals.   Intervention   No interventions have been documented.  AO Systolic Pressure 123XX123 mmHg  AO Diastolic Pressure 61 mmHg  AO Mean [id number] mmHg  LV Systolic Pressure [id number] mmHg  LV Diastolic Pressure 15 mmHg  LV EDP 23 mmHg  AOp Systolic Pressure [id number] mmHg  AOp Diastolic Pressure 54 mmHg  AOp Mean Pressure 97 mmHg  LVp Systolic Pressure 0000000 mmHg  LVp Diastolic Pressure 16 mmHg  LVp EDP Pressure 25 mmHg    Laboratory Data:  High Sensitivity Troponin:   Recent Labs  Lab 12/16/20 2334 12/17/20 0439 12/17/20 0623 01/01/21 0045 01/01/21 0319  TROPONINIHS 562* 745* 735* 1,582* 1,673*     Chemistry Recent Labs  Lab 01/01/21 0045  NA 131*  K 5.6*  CL 88*  CO2 28  GLUCOSE 203*  BUN 54*  CREATININE 8.87*  CALCIUM 7.9*  GFRNONAA 6*  ANIONGAP 15    No results for input(s): PROT, ALBUMIN, AST, ALT, ALKPHOS, BILITOT in the last 168 hours. Hematology Recent Labs  Lab 01/01/21 0045 01/01/21 1004  WBC 6.9 7.5  RBC 2.89* 3.00*  HGB 9.3* 9.8*  HCT 28.9* 30.4*  MCV 100.0 101.3*  MCH 32.2 32.7  MCHC 32.2 32.2  RDW 15.5 15.1  PLT 122* 130*   BNP Recent Labs  Lab 01/01/21 1004  BNP 3,450.4*    DDimer No results for input(s): DDIMER in the last 168 hours.   Radiology/Studies:  DG Chest 2 View  Result Date: 01/01/2021 CLINICAL DATA:  Hemoptysis EXAM: CHEST - 2 VIEW COMPARISON:  12/22/2020 FINDINGS: Mild cardiomegaly with calcific aortic atherosclerosis. Mild bilateral parahilar interstitial opacity without overt pulmonary edema. No pleural effusion or pneumothorax. Left subclavian stent. IMPRESSION: Mild cardiomegaly without  overt pulmonary edema Electronically Signed   By: Ulyses Jarred M.D.   On: 01/01/2021 01:23     Assessment and Plan:   1. NSTEMI with elevated troponin further than last admit and continued DOE at home.  Plan initially had been for cardiac cath on last admit but with unknown source of bleed this was cancelled and pt treated with IMDUR and ranexa for his known CAD, CABG and prior stents. Will revisit this with no improvement in symptoms at discharge. Unable to use BB  due to junct rhythm on prior adit though K+ was 6.2 at the time is on imdur.  2. CAD with hx CABG and on last cath 04/2019  VG to OM with 99% stenosis but stent to native OM was patent. VG to RCA 100% stenosis, LIMA to LAD with 50% distal stenosis near insertion site. He did have collateral.  large ramus was patent with 40-50% mid ramus stenosis (in-stent restenosis).  3. Hx junct rhythm on arrival earlier this month with K+ 6.2, BB was stopped, no further brady noted 4. Hx PAF maintaining SR, on eliquis no acute GI bleed since resuming.  5. HTN  6. ESRD on HD per renal  7. Hx of CM but most recent echo 11/28/20 with EF 55-60%, modearte LVH. G2DD.  8. DM-1 on insulin pump, per IM 9. HLD on statin and zetia LDL goal <70.    Risk Assessment/Risk Scores:     TIMI Risk Score for Unstable Angina or Non-ST Elevation MI:   [name] TIMI risk score is 3, which indicates a 13% risk of all cause mortality, new or recurrent myocardial infarction or need for urgent revascularization in the next 14 days.  New York Heart Association (NYHA) Functional Class NYHA Class III        For questions or updates, please contact [facility] Please consult [email or web address] for contact info under    Signed, Cecilie Kicks, NP  01/01/2021 3:36 PM   Patient is an unfort 65 yo with multiple medical problems including ESRD (on dialysis), CAD (s/p CABGx 3 in 2018; last cath in 2020 showing occlusion of SVG to RCA and SVG to ramus; LIMA patent)   REcent admit with USA/NSTEMI   Plan for medical Rx given severe anemia The pt presents with 1 day of N/V, dizziness and some emesis.of blood.   Sats dropped to 88%  Patient currently undergoing dialysis  ON exam, he appears sleepy   Laying comfortably Neck:  JVP is  Not elevated  Lungs are relatively clear anteriorly  Cardiac RRR  No S3  No signif  Murmurs ABd is supple  Ext are without edema  Lab signif for BNP of 3450 and troponin of 1582, 1673   Hgb was 9.3    EKG shows LVH with repolarization abnormalitiy Impression:   NSTEMI   Pt just discharged last week with NSTEMI   Plan for medical Rx given gastritis on recent EGD.    Hgb was 6.7 at time    Back on Eliquis  NSTEMI Presents now with N/V and emesis of blood   Trop elevated    Difficult to interpret in setting of ESRD and with severe CAD (occludied grafts x 2) Hgb is better now but again given such recent EGD and vomiting of some blood  Worrisome to consider anything anything invasive.   WIll continue to follow    At dialysis now . Fairly comfortable   Dorris Carnes MD

## 2021-01-01 NOTE — Plan of Care (Signed)
  Problem: Education: Goal: Ability to verbalize understanding of medication therapies will improve Outcome: Progressing   Problem: Activity: Goal: Capacity to carry out activities will improve Outcome: Progressing   Problem: Education: Goal: Knowledge of General Education information will improve Description: Including pain rating scale, medication(s)/side effects and non-pharmacologic comfort measures Outcome: Progressing   Problem: Health Behavior/Discharge Planning: Goal: Ability to manage health-related needs will improve Outcome: Progressing   Problem: Clinical Measurements: Goal: Ability to maintain clinical measurements within normal limits will improve Outcome: Progressing   Problem: Clinical Measurements: Goal: Diagnostic test results will improve Outcome: Progressing   Problem: Nutrition: Goal: Adequate nutrition will be maintained Outcome: Progressing   Problem: Pain Managment: Goal: General experience of comfort will improve Outcome: Progressing

## 2021-01-01 NOTE — Progress Notes (Signed)
Patient's son is coming for interpreting tonight. Talked Dawn (AD) regarding this matter. She is okay to pt's son can stay through the night. Night shift nurse made aware of it. HS [employer]

## 2021-01-01 NOTE — Progress Notes (Signed)
Ranexa currently on hold given prolonged QTc - discussed with Dr Tamala Julian but in addition to QTc concerns, would consider not resuming at discharge given ESRD (minimally dialyzed) and variable absorption was observed in small PK study - if used need to use extreme caution and monitor closely for adverse effects.   Antonietta Jewel, PharmD, [place] Clinical Pharmacist  Phone: [phone] 01/01/2021 10:58 AM  Please check AMION for all [place] phone numbers After 10:00 PM, call [place] [phone]

## 2021-01-01 NOTE — Progress Notes (Signed)
Pt BS 66. Gave pt apple juice. Will recheck in 15 min.

## 2021-01-01 NOTE — ED Triage Notes (Signed)
Patient arrived via POV c/o dizziness, emesis, nausea, x 1 day. Patient family states 2 x emesis with blood. Patient recently released from hospital. Patient is AO x 4, VS WDL, unsteady gait.

## 2021-01-01 NOTE — ED Notes (Signed)
Report given to Romie Jumper at [facility]. Report given to [facility].

## 2021-01-01 NOTE — Progress Notes (Signed)
Pt arrived to unit. Pt stable. Notified provider.

## 2021-01-01 NOTE — Consult Note (Signed)
Renal Service Consult Note [facility]  Kenneth [name] 01/01/2021 [name], MD Requesting Physician: Dr. Harvest Forest  Reason for Consult: ESRD pt w/ SOB HPI: The patient is a 65 y.o. year-old w/ hx of atrial fib, CAD/ MI/ hx CABG w/ subsequent PCI, PAD, HTN, HHD, HL, ESRD on HD, DM2, depression presented to ED early am today w/ c/o dizziness, emesis, nausea for 1 day. Family stated +emesis x 2 w/ blood. In ED pt w/ unsteady gait. BP 150/70, SpO2 88% on RA, HR 50-60, afeb. Hb 9.3, K 5.6  CR 8.8, trop 1582.  CXR showed vasc congestion, large cardiac shadow. Pt admitted for acute resp failure w/ low SpO2. We are asked to see for dialysis.   Pt seen in room. No severe SOB or cough/ orthopnea.   Last admit April 2022 for bradycardia and anemia, found to have GIB and underwent w/u per GI >  EGD, colon, VCE: showed reactive gastropathy, gastritis, duodenitis, postulcer deformity in duodenal bulb.  No source for blood in the colon. No source for bleeding seen on VCE.  Last heart cath aug 2020 showed occluded SVG x 2, patent LIMA-LAD. 50% ISR of large ramus stent. Tight ostial stenosis of small caliber AV LCx, not a good target. 100% occluded RCA w/ collaterals. No target for intervention, medical Rx recommended.    ROS  denies CP  no joint pain   no HA  no blurry vision  no rash  no diarrhea      Past Medical History  Past Medical History:  Diagnosis Date  . Acute pulmonary edema ([place]) 05/31/2016  . Acute respiratory failure with hypoxemia ([place]) 05/31/2016  . CAD (coronary artery disease) 10/30/2016   NSTEMI 9/17 with CABG x 3 (LIMA-LAD, SVG-OM, SVG-RCA).   - NSTEMI 10/18 s/p DES to ostial SVG to  OM  . Depression   . Diabetic foot ulcer ([place]) 04/11/2017  . 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 ESRD (end stage renal disease) on dialysis [facility])    "MWF; [place]" (03/30/2018)  . Gastroesophageal reflux disease 08/18/2016  . HCAP (healthcare-associated  pneumonia)   . Hemodialysis status ([place])   . Hyperlipidemia   . Hypertension   . Hypertensive heart disease 09/12/2017  . Ischemic rest pain of lower extremity 02/06/2015  . 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 NSTEMI (non-ST elevated myocardial infarction) ([place])   . PAF (paroxysmal atrial fibrillation) ([place])   . Pleural effusion   . Pronation deformity of both feet 02/27/2015   Past Surgical History  Past Surgical History:  Procedure Laterality Date  . AV FISTULA PLACEMENT Left 06/22/2016   Procedure: ARTERIOVENOUS (AV) FISTULA CREATION LEFT UPPER ARM;  Surgeon: Rosetta Posner, MD;  Location: [place];  Service: Vascular;  Laterality: Left;  . BIOPSY  12/19/2020   Procedure: BIOPSY;  Surgeon: Ladene Artist, MD;  Location: [place];  Service: Endoscopy;;  . CARDIAC CATHETERIZATION N/A 05/31/2016   Procedure: Left Heart Cath and Coronary Angiography;  Surgeon: Jettie Booze, MD;  Location: [place] CV LAB;  Service: Cardiovascular;  Laterality: N/A;  . CARDIAC CATHETERIZATION N/A 05/31/2016   Procedure: Right Heart Cath;  Surgeon: Jettie Booze, MD;  Location: [place] CV LAB;  Service: Cardiovascular;  Laterality: N/A;  . CARDIAC CATHETERIZATION N/A 05/31/2016   Procedure: IABP Insertion;  Surgeon: Jettie Booze, MD;  Location: [place] CV LAB;  Service: Cardiovascular;  Laterality: N/A;  .  COLONOSCOPY WITH PROPOFOL N/A 12/20/2020   Procedure: COLONOSCOPY WITH PROPOFOL;  Surgeon: Mauri Pole, MD;  Location: [place] ENDOSCOPY;  Service: Endoscopy;  Laterality: N/A;  . CORONARY ARTERY BYPASS GRAFT N/A 06/05/2016   Procedure: CORONARY ARTERY BYPASS GRAFTING (CABG) x3 LIMA to LAD -SVG to OM -SVG to RCA;  Surgeon: Ivin Poot, MD;  Location: [place];  Service: Open Heart Surgery;  Laterality: N/A;  . CORONARY STENT INTERVENTION N/A 07/07/2017   Procedure: CORONARY STENT INTERVENTION;  Surgeon: Wellington Hampshire, MD;  Location: [place] CV LAB;   Service: Cardiovascular;  Laterality: N/A;  . CORONARY STENT INTERVENTION N/A 03/30/2018   Procedure: CORONARY STENT INTERVENTION;  Surgeon: Martinique, Peter M, MD;  Location: [place] CV LAB;  Service: Cardiovascular;  Laterality: N/A;  . ESOPHAGOGASTRODUODENOSCOPY (EGD) WITH PROPOFOL N/A 12/19/2020   Procedure: ESOPHAGOGASTRODUODENOSCOPY (EGD) WITH PROPOFOL;  Surgeon: Ladene Artist, MD;  Location: [facility] ENDOSCOPY;  Service: Endoscopy;  Laterality: N/A;  . GIVENS CAPSULE STUDY N/A 12/20/2020   Procedure: GIVENS CAPSULE STUDY;  Surgeon: Mauri Pole, MD;  Location: [place];  Service: Endoscopy;  Laterality: N/A;  . INSERTION OF DIALYSIS CATHETER N/A 06/05/2016   Procedure: INSERTION OF DIALYSIS/trialysis CATHETER;  Surgeon: Ivin Poot, MD;  Location: [place];  Service: Vascular;  Laterality: N/A;  . INSERTION OF DIALYSIS CATHETER Right 06/13/2016   Procedure: INSERTION OF DIALYSIS CATHETER RIGHT INTERNAL JUGULAR;  Surgeon: Conrad Loretto, MD;  Location: [place];  Service: Vascular;  Laterality: Right;  . INSERTION OF DIALYSIS CATHETER Right 09/07/2019   Procedure: Insertion Of Dialysis Catheter;  Surgeon: Rosetta Posner, MD;  Location: [facility];  Service: Vascular;  Laterality: Right;  . INTRAOPERATIVE TRANSESOPHAGEAL ECHOCARDIOGRAM N/A 06/05/2016   Procedure: INTRAOPERATIVE TRANSESOPHAGEAL ECHOCARDIOGRAM;  Surgeon: Ivin Poot, MD;  Location: [place];  Service: Open Heart Surgery;  Laterality: N/A;  . LEFT HEART CATH AND CORS/GRAFTS ANGIOGRAPHY N/A 11/03/2016   Procedure: Left Heart Cath and Cors/Grafts Angiography;  Surgeon: Troy Sine, MD;  Location: [place] CV LAB;  Service: Cardiovascular;  Laterality: N/A;  . LEFT HEART CATH AND CORS/GRAFTS ANGIOGRAPHY N/A 07/07/2017   Procedure: LEFT HEART CATH AND CORS/GRAFTS ANGIOGRAPHY;  Surgeon: Larey Dresser, MD;  Location: [place] CV LAB;  Service: Cardiovascular;  Laterality: N/A;  . LEFT HEART CATH AND CORS/GRAFTS ANGIOGRAPHY N/A  03/30/2018   Procedure: LEFT HEART CATH AND CORS/GRAFTS ANGIOGRAPHY;  Surgeon: Martinique, Peter M, MD;  Location: [place] CV LAB;  Service: Cardiovascular;  Laterality: N/A;  . LEFT HEART CATH AND CORS/GRAFTS ANGIOGRAPHY N/A 04/17/2019   Procedure: LEFT HEART CATH AND CORS/GRAFTS ANGIOGRAPHY;  Surgeon: Larey Dresser, MD;  Location: [place] CV LAB;  Service: Cardiovascular;  Laterality: N/A;  . POLYPECTOMY  12/20/2020   Procedure: POLYPECTOMY;  Surgeon: Mauri Pole, MD;  Location: [place];  Service: Endoscopy;;  . REVISION OF ARTERIOVENOUS GORETEX GRAFT Left [id number]   Procedure: PLICATION OF ANEURYSM OF ARTERIOVENOUS FISTULA  LEFT ARM;  Surgeon: Rosetta Posner, MD;  Location: [place];  Service: Vascular;  Laterality: Left;  . REVISON OF ARTERIOVENOUS FISTULA Left 09/07/2019   Procedure: REVISON OF LEFT UPPER ARM ARTERIOVENOUS FISTULA;  Surgeon: Rosetta Posner, MD;  Location: [facility] OR;  Service: Vascular;  Laterality: Left;   Family History  Family History  Problem Relation Age of Onset  . CAD Father   . Colon cancer Father   . Diabetes Brother    Social History  reports that he  quit smoking about 8 years ago. His smoking use included cigarettes. He has a 52.50 pack-year smoking history. He has never used smokeless tobacco. He reports that he does not drink alcohol and does not use drugs. Allergies No Known Allergies Home medications Prior to Admission medications   Medication Sig Start Date End Date Taking? Authorizing Provider  acetaminophen (TYLENOL) 500 MG tablet Take 1,000 mg by mouth every 6 (six) hours as needed for mild pain or headache.     [provider]  Ascorbic Acid (VITAMIN C) 1000 MG tablet Take 1,000 mg by mouth daily.    [provider]  atorvastatin (LIPITOR) 80 MG tablet Take 1 tablet by mouth once daily Patient taking differently: Take 80 mg by mouth daily before lunch. 12/22/19   Larey Dresser, MD  calcium acetate (PHOSLO) 667 MG capsule  Take 667 mg by mouth 3 (three) times daily with meals.  07/09/17   [provider]  citalopram (CELEXA) 20 MG tablet Take 20 mg by mouth daily with lunch.  02/25/17   [provider]  ELIQUIS 5 MG TABS tablet Take 1 tablet by mouth twice daily Patient taking differently: Take 5 mg by mouth 2 (two) times daily. 02/06/20   Larey Dresser, MD  ezetimibe (ZETIA) 10 MG tablet Take 1 tablet by mouth once daily Patient taking differently: Take 10 mg by mouth daily. 02/06/20   Larey Dresser, MD  gabapentin (NEURONTIN) 100 MG capsule Take 100 mg by mouth daily with lunch.  02/11/17   [provider]  Insulin Human (INSULIN PUMP) SOLN Inject into the skin continuous. NOVOLOG    [provider]  isosorbide mononitrate (IMDUR) 60 MG 24 hr tablet Take 1 tablet by mouth once daily 05/27/20   Larey Dresser, MD  NOVOLOG 100 UNIT/ML injection Inject 6-10 Units into the skin See admin instructions. Starts with 10 units per day and adds 6 units with each meal via pump 01/08/19   [provider]  pantoprazole (PROTONIX) 40 MG tablet Take 40 mg by mouth daily before lunch.  01/07/17   [provider]  ranolazine (RANEXA) 500 MG 12 hr tablet Take 1 tablet (500 mg total) by mouth 2 (two) times daily. 12/25/20 01/24/21  Barb Merino, MD     Vitals:   01/01/21 0157 01/01/21 0322 01/01/21 0618 01/01/21 0800  BP: (!) 160/61 (!) 156/57 (!) 175/68 (!) 183/66  Pulse: (!) 48 (!) 48 (!) 59 (!) 59  Resp: '18 16 18 18  '$ Temp:   98.7 F (37.1 C) 98.2 F (36.8 C)  TempSrc:   Oral Oral  SpO2: 91% 93% 94% 94%  Weight:      Height:       Exam Gen alert, no distress No rash, cyanosis or gangrene Sclera anicteric, throat clear  No jvd or bruits Chest mild rales bilat bases, no wheezing or ^wob RRR no MRG Abd soft ntnd no mass or ascites +bs GU normal MS no joint effusions or deformity Ext trace pretib edema, no wounds or ulcers Neuro is alert, Ox 3 , nf  L arm  AVF+bruit     Home meds:  - phoslo 1 ac tid/ protonix 40 qd  - ranexa 500 bid/ zeita 10 qd/ imdur 60 qd/ lipitor 80 qd  - eliquis 5 bid  - insulin novolog per insulin pump  - neurontin 100 qd/ celexa 20 qd  - prn's/ vitamins/ supplements   CXR - FINDINGS: Mild cardiomegaly with calcific aortic  atherosclerosis. Mild bilateral parahilar interstitial opacity without overt pulmonary edema.    OP HD: MWF AF  3h 48mn  400/500 59.5kg   2/2 bath  LUE AVF 15g  Hep none   - mircera 150 q2, next due 5/2   - hect 2 ug tiw  - sensipar '60mg'$  tiw   Assessment/ Plan: 1. SOB/ hypoxia - suspect vol overload and early pulm edema. Plan is for HD today w/ max UF as tolerated.  2. ESRD - cont HD MWF. HD today.   3. Hypertension/volume  - lower volume with HD 4. Anemia ckd - continue outpatient ESA/Fe regimen 5. MBD ckd -  Continue VDRA/Sensipar, binder 6. CAD - hx CABG /STEMI/stents 2018 /ISCM  7. Atrial fibrillation- on metoprolol, Eliquis 8. DM - per primary      RKelly Splinter MD 01/01/2021, 9:23 AM  Recent Labs  Lab 01/01/21 0045  WBC 6.9  HGB 9.3*   Recent Labs  Lab 01/01/21 0045  K 5.6*  BUN 54*  CREATININE 8.87*  CALCIUM 7.9*

## 2021-01-01 NOTE — Plan of Care (Signed)
[facility] will assume care on arrival to accepting facility. Until arrival, care as per EDP. However, [facility] available 24/7 for questions and assistance.  

## 2021-01-01 NOTE — Progress Notes (Signed)
Patient only speaks little [place], patient's wife is better than him, but still need translate. Patient has insulin pump on RLL, but patient couldn't set up after meals with calculation insulin dose for bolus. He stated that he didn't want to do it because his blood sugar was lower today. Talked to Dr. Tamala Julian regarding this matter and patient is not capable to operate insulin pump at this time. Pt's wife told me his insulin pump has been problematic, at times calculate insulin dose for bolus after meal, his blood sugar went too low. Patient's wife stated that she mentioned pt's endocrine doc, but no fix the problem. At this time took out insulin pump and checking CBG Q 4hrs with insulin coverage. Patient went to HD around 2 pm. HS Truman Hayward RN

## 2021-01-01 NOTE — H&P (Addendum)
History and Physical    Kenneth [name] [id number] DOB: 06-26-1956 DOA: 01/01/2021  Referring MD/NP/PA: Beckie Salts, MD PCP: [name], MD  Patient coming from:  [facility] transfer  Chief Complaint: Coughing up blood  I have personally briefly reviewed patient's old medical records in [place]   HPI: KHYRAN ENGBERG is a 65 y.o. [place] speaking male with medical history significant of HTN, HLD, PAF on Eliquis, ESRD on HD(M, W, F), CAD sp CABG, diastolic CHF, and DM type 1 presents with complaints coughing up blood and shortness of breath.  History is obtained with use of patient's nurse acting as Optometrist.  Recently hospitalized from 4/4-4/13 with suspected GI bleed with hemoglobin down to 6.7 requiring transfusion with no clear source found on EGD or colonoscopy, symptomatic bradycardia for which beta-blocker was discontinued, and elevated troponin not likely to be due to demand.  Since leaving the hospital patient has felt nauseous and dizzy with standing.  His stools were initially black, but returned to a brown color.  Denies any loss of consciousness or recent falls.  With any significant exertion he has been feeling more short of breath, but yesterday had coughed bright red blood.  Estimates having 10 ml of bright red blood present when he coughed and noticed that it occurred 3 times.  He declines and hemodialysis on 4/18.  At that time he was reportedly 3 to 5 pounds over his dry weight and notes that they were able to remove approximately 2 pounds after hemodialysis.  Associated symptoms include complaints of chest pressure/stiffness, palpitations, poor appetite, and generalized fatigue.  Denies any vomiting, abdominal pain, or dysuria.  Patient's wife reportedly noted that the patient had been having difficulty with his insulin pump and had intermittently been having low blood sugars at home.  ED Course: Upon admission to the emergency department patient was seen to be afebrile,  pulse 48-62, blood pressure 156/57-175/68, and O2 saturations as low as 88% with improvement on 2 L of nasal cannula oxygen.  Labs significant for hemoglobin 9.3(hemoglobin 8.3 on 4/13), platelets 122, sodium 131, potassium 5.6, BUN 54, creatinine 8.87, glucose 203, and troponin 1582.  Chest x-ray showing mild cardiomegaly without overt pulmonary edema.  Patient had been given Benadryl 25 mg IV and Zofran 4 mg IV with resolution of dizziness complaints.  Review of Systems  Constitutional: Positive for malaise/fatigue. Negative for fever.       Positive for weight gain  HENT: Negative for ear discharge and nosebleeds.   Eyes: Negative for photophobia and pain.  Respiratory: Positive for cough, hemoptysis and shortness of breath.   Cardiovascular: Positive for chest pain and palpitations. Negative for leg swelling.  Gastrointestinal: Positive for nausea. Negative for abdominal pain and vomiting.  Genitourinary: Negative for dysuria and hematuria.  Musculoskeletal: Negative for falls.  Skin: Negative for rash.  Neurological: Positive for dizziness. Negative for loss of consciousness.  Endo/Heme/Allergies: Negative for polydipsia.  Psychiatric/Behavioral: Negative for substance abuse.    Past Medical History:  Diagnosis Date  . Acute pulmonary edema ([place]) 05/31/2016  . Acute respiratory failure with hypoxemia ([place]) 05/31/2016  . CAD (coronary artery disease) 10/30/2016   NSTEMI 9/17 with CABG x 3 (LIMA-LAD, SVG-OM, SVG-RCA).   - NSTEMI 10/18 s/p DES to ostial SVG to  OM  . Depression   . Diabetic foot ulcer ([place]) 04/11/2017  . 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 ESRD (end stage renal disease) on dialysis ([place])    "  MWF; [facility]" (03/30/2018)  . Gastroesophageal reflux disease 08/18/2016  . HCAP (healthcare-associated pneumonia)   . Hemodialysis status ([place])   . Hyperlipidemia   . Hypertension   . Hypertensive heart disease 09/12/2017  . Ischemic rest pain of lower  extremity 02/06/2015  . 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 NSTEMI (non-ST elevated myocardial infarction) ([place])   . PAF (paroxysmal atrial fibrillation) ([place])   . Pleural effusion   . Pronation deformity of both feet 02/27/2015    Past Surgical History:  Procedure Laterality Date  . AV FISTULA PLACEMENT Left 06/22/2016   Procedure: ARTERIOVENOUS (AV) FISTULA CREATION LEFT UPPER ARM;  Surgeon: Rosetta Posner, MD;  Location: [place];  Service: Vascular;  Laterality: Left;  . BIOPSY  12/19/2020   Procedure: BIOPSY;  Surgeon: Ladene Artist, MD;  Location: [place];  Service: Endoscopy;;  . CARDIAC CATHETERIZATION N/A 05/31/2016   Procedure: Left Heart Cath and Coronary Angiography;  Surgeon: Jettie Booze, MD;  Location: [place] CV LAB;  Service: Cardiovascular;  Laterality: N/A;  . CARDIAC CATHETERIZATION N/A 05/31/2016   Procedure: Right Heart Cath;  Surgeon: Jettie Booze, MD;  Location: [place] [facility] LAB;  Service: Cardiovascular;  Laterality: N/A;  . CARDIAC CATHETERIZATION N/A 05/31/2016   Procedure: IABP Insertion;  Surgeon: Jettie Booze, MD;  Location: [place] CV LAB;  Service: Cardiovascular;  Laterality: N/A;  . COLONOSCOPY WITH PROPOFOL N/A 12/20/2020   Procedure: COLONOSCOPY WITH PROPOFOL;  Surgeon: Mauri Pole, MD;  Location: [place] ENDOSCOPY;  Service: Endoscopy;  Laterality: N/A;  . CORONARY ARTERY BYPASS GRAFT N/A 06/05/2016   Procedure: CORONARY ARTERY BYPASS GRAFTING (CABG) x3 LIMA to LAD -SVG to OM -SVG to RCA;  Surgeon: Ivin Poot, MD;  Location: [place];  Service: Open Heart Surgery;  Laterality: N/A;  . CORONARY STENT INTERVENTION N/A 07/07/2017   Procedure: CORONARY STENT INTERVENTION;  Surgeon: Wellington Hampshire, MD;  Location: [place] CV LAB;  Service: Cardiovascular;  Laterality: N/A;  . CORONARY STENT INTERVENTION N/A 03/30/2018   Procedure: CORONARY STENT INTERVENTION;  Surgeon: Martinique, Peter M, MD;  Location: [place] CV LAB;  Service: Cardiovascular;  Laterality: N/A;  . ESOPHAGOGASTRODUODENOSCOPY (EGD) WITH PROPOFOL N/A 12/19/2020   Procedure: ESOPHAGOGASTRODUODENOSCOPY (EGD) WITH PROPOFOL;  Surgeon: Ladene Artist, MD;  Location: [facility] ENDOSCOPY;  Service: Endoscopy;  Laterality: N/A;  . GIVENS CAPSULE STUDY N/A 12/20/2020   Procedure: GIVENS CAPSULE STUDY;  Surgeon: Mauri Pole, MD;  Location: [place];  Service: Endoscopy;  Laterality: N/A;  . INSERTION OF DIALYSIS CATHETER N/A 06/05/2016   Procedure: INSERTION OF DIALYSIS/trialysis CATHETER;  Surgeon: Ivin Poot, MD;  Location: [place];  Service: Vascular;  Laterality: N/A;  . INSERTION OF DIALYSIS CATHETER Right 06/13/2016   Procedure: INSERTION OF DIALYSIS CATHETER RIGHT INTERNAL JUGULAR;  Surgeon: Conrad [place], MD;  Location: [place];  Service: Vascular;  Laterality: Right;  . INSERTION OF DIALYSIS CATHETER Right 09/07/2019   Procedure: Insertion Of Dialysis Catheter;  Surgeon: Rosetta Posner, MD;  Location[facility];  Service: Vascular;  Laterality: Right;  . INTRAOPERATIVE TRANSESOPHAGEAL ECHOCARDIOGRAM N/A 06/05/2016   Procedure: INTRAOPERATIVE TRANSESOPHAGEAL ECHOCARDIOGRAM;  Surgeon: Ivin Poot, MD;  Location: [place];  Service: Open Heart Surgery;  Laterality: N/A;  . LEFT HEART CATH AND CORS/GRAFTS ANGIOGRAPHY N/A 11/03/2016   Procedure: Left Heart Cath and Cors/Grafts Angiography;  Surgeon: Troy Sine, MD;  Location: [place] CV LAB;  Service: Cardiovascular;  Laterality: N/A;  . LEFT HEART CATH AND CORS/GRAFTS ANGIOGRAPHY N/A 07/07/2017   Procedure: LEFT HEART CATH AND CORS/GRAFTS ANGIOGRAPHY;  Surgeon: Larey Dresser, MD;  Location: [place] CV LAB;  Service: Cardiovascular;  Laterality: N/A;  . LEFT HEART CATH AND CORS/GRAFTS ANGIOGRAPHY N/A 03/30/2018   Procedure: LEFT HEART CATH AND CORS/GRAFTS ANGIOGRAPHY;  Surgeon: Martinique, Peter M, MD;  Location: [place] CV LAB;  Service: Cardiovascular;  Laterality: N/A;  .  LEFT HEART CATH AND CORS/GRAFTS ANGIOGRAPHY N/A 04/17/2019   Procedure: LEFT HEART CATH AND CORS/GRAFTS ANGIOGRAPHY;  Surgeon: Larey Dresser, MD;  Location: [place] [facility];  Service: Cardiovascular;  Laterality: N/A;  . POLYPECTOMY  12/20/2020   Procedure: POLYPECTOMY;  Surgeon: Mauri Pole, MD;  Location: [place];  Service: Endoscopy;;  . REVISION OF ARTERIOVENOUS GORETEX GRAFT Left [id number]   Procedure: PLICATION OF ANEURYSM OF ARTERIOVENOUS FISTULA  LEFT ARM;  Surgeon: Rosetta Posner, MD;  Location: [place];  Service: Vascular;  Laterality: Left;  . REVISON OF ARTERIOVENOUS FISTULA Left 09/07/2019   Procedure: REVISON OF LEFT UPPER ARM ARTERIOVENOUS FISTULA;  Surgeon: Rosetta Posner, MD;  Location: [facility] OR;  Service: Vascular;  Laterality: Left;     reports that he quit smoking about 8 years ago. His smoking use included cigarettes. He has a 52.50 pack-year smoking history. He has never used smokeless tobacco. He reports that he does not drink alcohol and does not use drugs.  No Known Allergies  Family History  Problem Relation Age of Onset  . CAD Father   . Colon cancer Father   . Diabetes Brother     Prior to Admission medications   Medication Sig Start Date End Date Taking? Authorizing Provider  acetaminophen (TYLENOL) 500 MG tablet Take 1,000 mg by mouth every 6 (six) hours as needed for mild pain or headache.     [provider]  Ascorbic Acid (VITAMIN C) 1000 MG tablet Take 1,000 mg by mouth daily.    [provider]  atorvastatin (LIPITOR) 80 MG tablet Take 1 tablet by mouth once daily Patient taking differently: Take 80 mg by mouth daily before lunch. 12/22/19   Larey Dresser, MD  calcium acetate (PHOSLO) 667 MG capsule Take 667 mg by mouth 3 (three) times daily with meals.  07/09/17   [provider]  citalopram (CELEXA) 20 MG tablet Take 20 mg by mouth daily with lunch.  02/25/17   [provider]  ELIQUIS 5 MG TABS tablet Take  1 tablet by mouth twice daily 02/06/20   Larey Dresser, MD  ezetimibe (ZETIA) 10 MG tablet Take 1 tablet by mouth once daily 02/06/20   Larey Dresser, MD  gabapentin (NEURONTIN) 100 MG capsule Take 100 mg by mouth daily with lunch.  02/11/17   [provider]  Insulin Human (INSULIN PUMP) SOLN Inject into the skin continuous. NOVOLOG    [provider]  isosorbide mononitrate (IMDUR) 60 MG 24 hr tablet Take 1 tablet by mouth once daily 05/27/20   Larey Dresser, MD  NOVOLOG 100 UNIT/ML injection Inject 6-10 Units into the skin See admin instructions. Starts with 10 units per day and adds 6 units with each meal via pump 01/08/19   [provider]  pantoprazole (PROTONIX) 40 MG tablet Take 40 mg by mouth daily before lunch.  01/07/17   [provider]  ranolazine (RANEXA) 500 MG 12 hr tablet Take 1 tablet (500 mg total) by mouth 2 (  two) times daily. 12/25/20 01/24/21  Barb Merino, MD    Physical Exam:  Constitutional: Older male currently in NAD, calm, comfortable Vitals:   01/01/21 0027 01/01/21 0157 01/01/21 0322 01/01/21 0618  BP:  (!) 160/61 (!) 156/57 (!) 175/68  Pulse:  (!) 48 (!) 48 (!) 59  Resp:  '18 16 18  '$ Temp:    98.7 F (37.1 C)  TempSrc:    Oral  SpO2:  91% 93% 94%  Weight: 66.7 kg     Height: '5\' 7"'$  (1.702 m)      Eyes: PERRL, lids and conjunctivae normal ENMT: Mucous membranes are moist. Posterior pharynx clear of any exudate or lesions.  Neck: normal, supple, no masses, no thyromegaly.  Positive JVD to the angle of the jaw Respiratory: Normal respiratory effort without significant wheezes or rhonchi appreciated.  Patient currently maintaining O2 saturations on room air. Cardiovascular: Bradycardic with positive systolic murmur. No lower extremity edema. 2+ pedal pulses. No carotid bruits.  Abdomen: no tenderness, no masses palpated. No hepatosplenomegaly. Bowel sounds positive.  Musculoskeletal: no clubbing / cyanosis. No joint  deformity upper and lower extremities. Good ROM, no contractures. Normal muscle tone.  Skin: no rashes, lesions, ulcers. No induration Neurologic: CN 2-12 grossly intact. Sensation intact, DTR normal. Strength 5/5 in all 4.  Psychiatric: Normal judgment and insight. Alert and oriented x 3. Normal mood.     Labs on Admission: I have personally reviewed following labs and imaging studies  CBC: Recent Labs  Lab 01/01/21 0045  WBC 6.9  NEUTROABS 5.3  HGB 9.3*  HCT 28.9*  MCV 100.0  PLT 123XX123*   Basic Metabolic Panel: Recent Labs  Lab 01/01/21 0045  NA 131*  K 5.6*  CL 88*  CO2 28  GLUCOSE 203*  BUN 54*  CREATININE 8.87*  CALCIUM 7.9*   GFR: Estimated Creatinine Clearance: 8 mL/min (A) (by C-G formula based on SCr of 8.87 mg/dL (H)). Liver Function Tests: No results for input(s): AST, ALT, ALKPHOS, BILITOT, PROT, ALBUMIN in the last 168 hours. No results for input(s): LIPASE, AMYLASE in the last 168 hours. No results for input(s): AMMONIA in the last 168 hours. Coagulation Profile: No results for input(s): INR, PROTIME in the last 168 hours. Cardiac Enzymes: No results for input(s): CKTOTAL, CKMB, CKMBINDEX, TROPONINI in the last 168 hours. BNP (last 3 results) No results for input(s): PROBNP in the last 8760 hours. HbA1C: No results for input(s): HGBA1C in the last 72 hours. CBG: Recent Labs  Lab 12/25/20 0738 01/01/21 0634 01/01/21 0706  GLUCAP 85 65* 122*   Lipid Profile: No results for input(s): CHOL, HDL, LDLCALC, TRIG, CHOLHDL, LDLDIRECT in the last 72 hours. Thyroid Function Tests: No results for input(s): TSH, T4TOTAL, FREET4, T3FREE, THYROIDAB in the last 72 hours. Anemia Panel: No results for input(s): VITAMINB12, FOLATE, FERRITIN, TIBC, IRON, RETICCTPCT in the last 72 hours. Urine analysis:    Component Value Date/Time   COLORURINE YELLOW 10/30/2016 [place] 10/30/2016 2339   LABSPEC 1.010 10/30/2016 2339   PHURINE 5.0 10/30/2016  2339   GLUCOSEU NEGATIVE 10/30/2016 2339   HGBUR NEGATIVE 10/30/2016 2339   BILIRUBINUR NEGATIVE 10/30/2016 2339   KETONESUR NEGATIVE 10/30/2016 2339   PROTEINUR 100 (A) 10/30/2016 2339   NITRITE NEGATIVE 10/30/2016 2339   LEUKOCYTESUR NEGATIVE 10/30/2016 2339   Sepsis Labs: No results found for this or any previous visit (from the past 240 hour(s)).   Radiological Exams on Admission: DG Chest 2 View  Result Date:  01/01/2021 CLINICAL DATA:  Hemoptysis EXAM: CHEST - 2 VIEW COMPARISON:  12/22/2020 FINDINGS: Mild cardiomegaly with calcific aortic atherosclerosis. Mild bilateral parahilar interstitial opacity without overt pulmonary edema. No pleural effusion or pneumothorax. Left subclavian stent. IMPRESSION: Mild cardiomegaly without overt pulmonary edema Electronically Signed   By: Ulyses Jarred M.D.   On: 01/01/2021 01:23    EKG: Independently reviewed.  Sinus rhythm at 62 bpm with first-degree heart block QT prolonged at 542  Assessment/Plan Acute hypoxemic respiratory failure suspected secondary to fluid overload diastolic congestive heart failure exacerbation: On admission patient was noted to have O2 saturations in the mid to upper 80s temporarily requiring placement on 2 L nasal cannula oxygen.  Chest x-ray significant for cardiomegaly without overt edema.  On physical exam patient with noticeable JVD.  Last echocardiogram from 11/28/2020 revealed EF of 55 to 60% with grade 2 diastolic dysfunction.  Reports recently being 3 to 5 pounds over his dry weight. -Admit to progressive bed -Continuous pulse oximetry with nasal cannula oxygen to maintain O2 saturations greater than 92%. -Check BNP -Fluid management per nephrology  Chest pain elevated troponin: Acute on chronic.  Patient reports some complaints of chest pressure/stiffness.  High-sensitivity troponin 1582-> 1673, but during previous hospitalization ranged from 44-745.  Evaluated by cardiology and thought secondary to demand  ischemia at that time.  Suspect symptoms could be similar during this admission as patient appears to be fluid overloaded. -Deferred switching patient to a heparin drip per cardiology -Continue Imdur  -Ranexa held due to prolonged QT.  Pharmacy also recommended discontinuation of the medication in regards to it not being dialyzed and there were variable absorption noted in a small PK study with HD patients -Cardiology formally consulted,  will follow-up for further recommendation  Hemoptysis anemia of chronic disease: Patient reports having 3 episodes of coughing bright red blood.  Hemoglobin 9.3 g/dL which appears improved from previous of 8.3 on 4/13.  Patient just recently had EGD and colonoscopy on 4/7/8 which did not reveal any clear signs of bleeding.  Suspect symptoms could be related with patient possibly being fluid overloaded an area of irritation. -Recheck CBC -Will type and screen if hemoglobin appears to be trending down  Hyperkalemia: Acute.  Potassium elevated at 5.6.  EKG without significant changes -To be addressed with hemodialysis -Will give Lokelma if hemodialysis unable to be done today  Bradycardia: Heart rates in the emergency department in 50s to 60s initially during last hospitalization patient had been seen to have symptomatic bradycardia for which metoprolol and amlodipine have been discontinued -Follow-up telemetry  Hypertensive urgency: Acute.  On admission blood pressures elevated up to 183/66.  During last admission patient's medications of metoprolol, amlodipine, hydralazine, and losartan had all been discontinued. -Hydralazine 50 mg 3 times daily -Hydralazine IV as needed for elevated blood pressures  Diabetes mellitus type 1 with hypoglycemia: Acute.  Patient noted to have blood glucose as low as 65 on admission.  Glucose improved after patient was given orange juice.  He is on insulin pump, but had reportedly been having issues at home with low blood sugars  per the patient's wife. -Hypoglycemic protocols initiated -Discontinued insulin pump orders -CBGs every 4 hours with sensitive SSI -If blood sugars remain consistently over 180 we will add on Lantus 5 units daily -Needs to follow-up with his endocrinologist in regards to the issues with his insulin pump  Prolonged QT interval: Acute.  On admission QTC elevated at 542. -Avoid QT prolonging medications -Correcting electrolyte abnormalities  ESRD on HD: Patient normally dialyzes Monday, Wednesday, Friday.  He reports that he was 3 to 5 pounds over his dry weight when he went to hemodialysis on 4/18 and they removed 2 pounds of fluid. -Continue calcium acetate -Nephrology consulted for need of hemodialysis  Paroxysmal atrial fibrillation on chronic anticoagulation: Patient currently appears to be in sinus rhythm.  CHA2DS2-VASc score = 4. Reports compliance with Eliquis that was restarted at discharge however it was noted that patient was only taking Eliquis once daily. -Continue Eliquis 5 mg twice daily unless hemoglobin drops  -Continue to educated patient and wife on the need of taking Eliquis twice daily  CAD s/p CABG  Hyperlipidemia -Continue atorvastatin and Zetia  GERD -On Protonix due to prolonged QT interval   DVT prophylaxis: will continue Eliquis unless hemoglobin seem to be trending down Code Status: Full Family Communication: Message left on patient's wife's voicemail Disposition Plan: Likely discharge home once medically stable Consults called: Nephrology, cardiology Admission status: Inpatient, require more than 2 midnight stay as patient appears to be fluid overloaded  Norval Morton MD [facility]   If 7PM-7AM, please contact night-coverage   01/01/2021, 7:11 AM

## 2021-01-01 NOTE — Procedures (Signed)
   I was present at this dialysis session, have reviewed the session itself and made  appropriate changes Kelly Splinter MD [place] pager [phone]   01/01/2021, 2:49 PM

## 2021-01-01 NOTE — Progress Notes (Signed)
Inpatient Diabetes Program Recommendations  AACE/ADA: New Consensus Statement on Inpatient Glycemic Control  Target Ranges:  Prepandial:   less than 140 mg/dL      Peak postprandial:   less than 180 mg/dL (1-2 hours)      Critically ill patients:  140 - 180 mg/dL   Results for Kenneth [name], Kenneth [name] (MRN [id number]) as of 01/01/2021 09:42  Ref. Range 01/01/2021 06:34 01/01/2021 07:06  Glucose-Capillary Latest Ref Range: 70 - 99 mg/dL 65 (L) 122 (H)  Results for Kenneth [name], Kenneth [name] (MRN [id number]) as of 01/01/2021 09:42  Ref. Range 01/01/2021 00:45  Glucose Latest Ref Range: 70 - 99 mg/dL 203 (H)   Review of Glycemic Control Diabetes history: DM1 Outpatient Diabetes medications: Medtronic insulin pump Current orders for Inpatient glycemic control: Insulin Pump AC&HS and 2 am  Inpatient Diabetes Program Recommendations:    Insulin: If patient has any other issues with hypoglycemia, may want to consider discontinuing insulin pump and ordering CBGs Q4H, Novolog 0-9 units Q4H, and if glucose is consistently over 180 mg/dl,  please consider ordering Lantus 5 units Q24H if SQ insulin will continue to be used for glycemic control while inpatient.  NOTE: Patient was recently inpatient 12/15/20-12/25/20 and was seen and followed by inpatient diabetes coordinator team. Patient was taken off insulin pump during that admission and placed on just Novolog sensitive correction Q4H.   Patient uses Medtronic insulin pump and setting were as follows on 12/23/20 when reviewed by inpatient diabetes coordinator: Basal 12A      0.45 units 6A        0.5 units 10P      0.45 units Total basal: 11.6 units/24 hours  Insulin to Carb Ratio: 1:12 grams (1 unit for 12 grams of carbs) Insulin Sensitivity Factor: 1:60 (1 unit drops glucose 60 mg/dl) Target Glucose: 100-120 mg/dl Active insulin time: 4 hours  Initial glucose 203 mg/dl on labs on 01/01/21 at 00:45 and CBG 65 mg/dl at 6:34 am today which was treated. If patient has any  other issues with hypoglycemia, may need to discontinue insulin pump and order CBGs Q4H, Novolog correction Q4H; then if CBGs become consistently over 180 mg/dl with SSI, may need to order very low dose basal insulin.  Thanks, Barnie Alderman, RN, MSN, CDE Diabetes Coordinator Inpatient Diabetes Program [phone] (Team Pager from 8am to 5pm)

## 2021-01-02 DIAGNOSIS — R778 Other specified abnormalities of plasma proteins: Secondary | ICD-10-CM

## 2021-01-02 DIAGNOSIS — I2510 Atherosclerotic heart disease of native coronary artery without angina pectoris: Secondary | ICD-10-CM

## 2021-01-02 DIAGNOSIS — I5022 Chronic systolic (congestive) heart failure: Secondary | ICD-10-CM

## 2021-01-02 LAB — BASIC METABOLIC PANEL
Anion gap: 11 (ref 5–15)
BUN: 23 mg/dL (ref 8–23)
CO2: 31 mmol/L (ref 22–32)
Calcium: 8.5 mg/dL — ABNORMAL LOW (ref 8.9–10.3)
Chloride: 91 mmol/L — ABNORMAL LOW (ref 98–111)
Creatinine, Ser: 6.02 mg/dL — ABNORMAL HIGH (ref 0.61–1.24)
GFR, Estimated: 10 mL/min — ABNORMAL LOW (ref >60)
Glucose, Bld: 120 mg/dL — ABNORMAL HIGH (ref 70–99)
Potassium: 4.6 mmol/L (ref 3.5–5.1)
Sodium: 133 mmol/L — ABNORMAL LOW (ref 135–145)

## 2021-01-02 LAB — GLUCOSE, CAPILLARY
Glucose-Capillary: 130 mg/dL — ABNORMAL HIGH (ref 70–99)
Glucose-Capillary: 206 mg/dL — ABNORMAL HIGH (ref 70–99)
Glucose-Capillary: 251 mg/dL — ABNORMAL HIGH (ref 70–99)
Glucose-Capillary: 105 mg/dL — ABNORMAL HIGH (ref 70–99)
Glucose-Capillary: 253 mg/dL — ABNORMAL HIGH (ref 70–99)

## 2021-01-02 LAB — CBC
HCT: 29.5 % — ABNORMAL LOW (ref 39.0–52.0)
Hemoglobin: 9.6 g/dL — ABNORMAL LOW (ref 13.0–17.0)
MCH: 32.4 pg (ref 26.0–34.0)
MCHC: 32.5 g/dL (ref 30.0–36.0)
MCV: 99.7 fL (ref 80.0–100.0)
Platelets: 152 10*3/uL (ref 150–400)
RBC: 2.96 MIL/uL — ABNORMAL LOW (ref 4.22–5.81)
RDW: 14.9 % (ref 11.5–15.5)
WBC: 11.3 10*3/uL — ABNORMAL HIGH (ref 4.0–10.5)
nRBC: 0 % (ref 0.0–0.2)

## 2021-01-02 MED ORDER — SODIUM CHLORIDE 0.9 % IV SOLN: 250.0000 mL | INTRAVENOUS | Status: DC | PRN

## 2021-01-02 MED ORDER — SODIUM CHLORIDE 0.9 % IV SOLN: INTRAVENOUS | Status: DC

## 2021-01-02 MED ORDER — SODIUM CHLORIDE 0.9% FLUSH
3.0000 mL | Freq: Two times a day (BID) | INTRAVENOUS | Status: DC
  Administered 2021-01-02 – 2021-01-03 (×3): 3 mL via INTRAVENOUS

## 2021-01-02 MED ORDER — ASPIRIN 81 MG PO CHEW
81.0000 mg | CHEWABLE_TABLET | ORAL | Status: AC
  Administered 2021-01-03: 81 mg via ORAL
  Filled 2021-01-02: qty 1

## 2021-01-02 MED ORDER — CHLORHEXIDINE GLUCONATE CLOTH 2 % EX PADS: 6.0000 | MEDICATED_PAD | Freq: Every day | CUTANEOUS | Status: DC

## 2021-01-02 MED ORDER — ASPIRIN 81 MG PO CHEW: 81.0000 mg | CHEWABLE_TABLET | ORAL | Status: DC

## 2021-01-02 MED ORDER — SODIUM CHLORIDE 0.9% FLUSH: 3.0000 mL | INTRAVENOUS | Status: DC | PRN

## 2021-01-02 MED ORDER — SODIUM CHLORIDE 0.9% FLUSH
3.0000 mL | Freq: Two times a day (BID) | INTRAVENOUS | Status: DC
  Administered 2021-01-02: 3 mL via INTRAVENOUS

## 2021-01-02 NOTE — Consult Note (Signed)
Advanced Heart Failure Team Consult Note   Primary Physician: [name], MD PCP-Cardiologist:  No primary care provider on file.  Reason for Consultation: Heart Failure   HPI:    Kenneth [name] is seen today for evaluation of heart failure at the request of Dr Eliseo Squires.   Kenneth [name] is a 65 year old with a history of of type II diabetes, CKD stage IV requiring HD for a period of time, CAD, and chronic systolic CHF presents for cardiology followup.  He was admitted in 9/17 with NSTEMI.  LHC showed 3VD and he had CABG x 3.  He had CKD stage IV when he was admitted and developed ESRD requiring HD while in the hospital.  He had transient atrial fibrillation post-CABG.  He developed cardiogenic shock requiring milrinone but was able to taper off.  Echo in 9/17 showed EF 35-40%.    Admitted 8/1 through 04/16/2017 with malignant hypertension due to medication noncompliance. Continues on HD.  HF M-W-F. Discharge weight 140 pounds.   Admitted 10/18 with chest pain and pulmonary edema. NSTEMI noted on admit. Echo showed preserved EF but + WMAs. Cath with 95% ostial SVG-OM1 stenosis with TIMI 2 flow in graft. Patient had DES to SVG-OM1.   Admitted in 12/18 with atypical chest pain, suspect not cardiac.   NSTEMI in 7/19 with severe in-stent restenosis in SVG-OM1, had DES x 2 to native OM1.  Echo showed EF 50%.  Atrial fibrillation was noted this admission.   He has had a chronic pattern of atypical chest pain.  Cardiolite in 3/20 showed EF 54%, small fixed inferior defect with no ischemia.   He was admitted in 8/20 with severe episode of chest pain.  Hs-TnI was mildly elevated without trend.  Cath was done, showing no target for PCI and stable disease. Echo was done showing EF 50-55% with moderate LVH and normal RV.    Followed in the HF clinic and was seen in Feb/March. Had been having ongoing chest pain. Set up for stress test. ABIs ordered - LE arterial dopplers were normal. Rt ABI 1.04. Lt  ABI 1.12. Marland Kitchen Echo 11/28/20 EF 55-60% .  Admitted 12/15/20 with chest pain, bloody stool, and bradycardia. Atropine was given with improvement.  Hgb was 6.8.; patient transfused 1 unit of blood K+ 6.2 glucose 345. Hs troponin 44 to 745 . BB was stopped. With GI bleed and high risk of recurrent GI bleed and unknown source (colonscopy was done with blood in colon but no source of bleeding,  EGD showed  post ulcer deformity in duodenal bulb ) Because of this it was decided on medical management of CAD and NSTEMI.   He was transfused and placed on imdur and ranexa. Discharged on 12/25/20.   Presented to ED with nausea/vomiting and shortness of breath this admission. He has been in NSR, HR in 60s.  Ranolazine stopped with long QTc. No chest pain.  He had nausea/vomiting again today.   Pertinent admission labs [id number], BNP 3450, sodium 131, hgb 9.3, SARS 2 negative.     Review of Systems: [y] = yes, '[ ]'$  = no   . General: Weight gain '[ ]'$ ; Weight loss '[ ]'$ ; Anorexia '[ ]'$ ; Fatigue '[ ]'$ ; Fever '[ ]'$ ; Chills '[ ]'$ ; Weakness '[ ]'$   . Cardiac: Chest pain/pressure [Y ]; Resting SOB '[ ]'$ ; Exertional SOB '[ ]'$ ; Orthopnea '[ ]'$ ; Pedal Edema '[ ]'$ ; Palpitations '[ ]'$ ; Syncope '[ ]'$ ; Presyncope '[ ]'$ ; Paroxysmal nocturnal dyspnea'[ ]'$   .  Pulmonary: Cough '[ ]'$ ; Wheezing'[ ]'$ ; Hemoptysis'[ ]'$ ; Sputum '[ ]'$ ; Snoring '[ ]'$   . GI: Vomiting'[ ]'$ ; Dysphagia'[ ]'$ ; Melena'[ ]'$ ; Hematochezia '[ ]'$ ; Heartburn'[ ]'$ ; Abdominal pain '[ ]'$ ; Constipation '[ ]'$ ; Diarrhea '[ ]'$ ; BRBPR '[ ]'$   . GU: Hematuria'[ ]'$ ; Dysuria '[ ]'$ ; Nocturia'[ ]'$   . Vascular: Pain in legs with walking '[ ]'$ ; Pain in feet with lying flat '[ ]'$ ; Non-healing sores '[ ]'$ ; Stroke '[ ]'$ ; TIA '[ ]'$ ; Slurred speech '[ ]'$ ;  . Neuro: Headaches'[ ]'$ ; Vertigo'[ ]'$ ; Seizures'[ ]'$ ; Paresthesias'[ ]'$ ;Blurred vision '[ ]'$ ; Diplopia '[ ]'$ ; Vision changes '[ ]'$   . Ortho/Skin: Arthritis '[ ]'$ ; Joint pain '[ ]'$ ; Muscle pain '[ ]'$ ; Joint swelling '[ ]'$ ; Back Pain '[ ]'$ ; Rash '[ ]'$   . Psych: Depression'[ ]'$ ; Anxiety'[ ]'$   . Heme: Bleeding problems '[ ]'$ ; Clotting disorders [  ]; Anemia '[ ]'$   . Endocrine: Diabetes '[ ]'$ ; Thyroid dysfunction'[ ]'$   Home Medications Prior to Admission medications   Medication Sig Start Date End Date Taking? Authorizing Provider  acetaminophen (TYLENOL) 500 MG tablet Take 1,000 mg by mouth every 6 (six) hours as needed for mild pain or headache.    Yes [provider]  Ascorbic Acid (VITAMIN C) 1000 MG tablet Take 1,000 mg by mouth daily.   Yes [provider]  atorvastatin (LIPITOR) 80 MG tablet Take 1 tablet by mouth once daily Patient taking differently: Take 80 mg by mouth daily before lunch. 12/22/19  Yes Larey Dresser, MD  calcium acetate (PHOSLO) 667 MG capsule Take 667 mg by mouth 3 (three) times daily with meals.  07/09/17  Yes [provider]  citalopram (CELEXA) 20 MG tablet Take 20 mg by mouth daily with lunch.  02/25/17  Yes [provider]  ELIQUIS 5 MG TABS tablet Take 1 tablet by mouth twice daily Patient taking differently: Take 5 mg by mouth 2 (two) times daily. 02/06/20  Yes Larey Dresser, MD  ezetimibe (ZETIA) 10 MG tablet Take 1 tablet by mouth once daily Patient taking differently: Take 10 mg by mouth daily. 02/06/20  Yes Larey Dresser, MD  gabapentin (NEURONTIN) 100 MG capsule Take 100 mg by mouth daily with lunch.  02/11/17  Yes [provider]  Insulin Human (INSULIN PUMP) SOLN Inject into the skin continuous. NOVOLOG   Yes [provider]  isosorbide mononitrate (IMDUR) 60 MG 24 hr tablet Take 1 tablet by mouth once daily Patient taking differently: Take 60 mg by mouth daily. 05/27/20  Yes Larey Dresser, MD  NOVOLOG 100 UNIT/ML injection Inject 6-10 Units into the skin See admin instructions. Starts with 10 units per day and adds 6 units with each meal via pump 01/08/19  Yes [provider]  pantoprazole (PROTONIX) 40 MG tablet Take 40 mg by mouth daily before lunch.  01/07/17  Yes [provider]  ranolazine (RANEXA) 500 MG 12 hr tablet Take  1 tablet (500 mg total) by mouth 2 (two) times daily. 12/25/20 01/24/21 Yes Barb Merino, MD    Past Medical History: Past Medical History:  Diagnosis Date  . Acute respiratory failure with hypoxemia ([place]) 05/31/2016  . CAD (coronary artery disease) 10/30/2016   NSTEMI 9/17 with CABG x 3 (LIMA-LAD, SVG-OM, SVG-RCA).   - NSTEMI 10/18 s/p DES to ostial SVG to  OM  . Depression   . Diabetic foot ulcer ([place]) 04/11/2017  . 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 ESRD on hemodialysis ([place])    "  MWF; [facility]" (03/30/2018)  . Gastroesophageal reflux disease 08/18/2016  . HCAP (healthcare-associated pneumonia)   . Hyperlipidemia   . Hypertension   . Hypertensive heart disease 09/12/2017  . Ischemic rest pain of lower extremity 02/06/2015  . 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 NSTEMI (non-ST elevated myocardial infarction) ([place])   . PAF (paroxysmal atrial fibrillation) ([place])   . Pleural effusion   . Pronation deformity of both feet 02/27/2015    Past Surgical History: Past Surgical History:  Procedure Laterality Date  . AV FISTULA PLACEMENT Left 06/22/2016   Procedure: ARTERIOVENOUS (AV) FISTULA CREATION LEFT UPPER ARM;  Surgeon: Rosetta Posner, MD;  Location: [facility];  Service: Vascular;  Laterality: Left;  . BIOPSY  12/19/2020   Procedure: BIOPSY;  Surgeon: Ladene Artist, MD;  Location: [place];  Service: Endoscopy;;  . CARDIAC CATHETERIZATION N/A 05/31/2016   Procedure: Left Heart Cath and Coronary Angiography;  Surgeon: Jettie Booze, MD;  Location: [facility];  Service: Cardiovascular;  Laterality: N/A;  . CARDIAC CATHETERIZATION N/A 05/31/2016   Procedure: Right Heart Cath;  Surgeon: Jettie Booze, MD;  Location: [place] [facility];  Service: Cardiovascular;  Laterality: N/A;  . CARDIAC CATHETERIZATION N/A 05/31/2016   Procedure: IABP Insertion;  Surgeon: Jettie Booze, MD;  Location: [place];  Service:  Cardiovascular;  Laterality: N/A;  . COLONOSCOPY WITH PROPOFOL N/A 12/20/2020   Procedure: COLONOSCOPY WITH PROPOFOL;  Surgeon: Mauri Pole, MD;  Location: [facility];  Service: Endoscopy;  Laterality: N/A;  . CORONARY ARTERY BYPASS GRAFT N/A 06/05/2016   Procedure: CORONARY ARTERY BYPASS GRAFTING (CABG) x3 LIMA to LAD -SVG to OM -SVG to RCA;  Surgeon: Ivin Poot, MD;  Location: [facility];  Service: Open Heart Surgery;  Laterality: N/A;  . CORONARY STENT INTERVENTION N/A 07/07/2017   Procedure: CORONARY STENT INTERVENTION;  Surgeon: Wellington Hampshire, MD;  Location: [facility];  Service: Cardiovascular;  Laterality: N/A;  . CORONARY STENT INTERVENTION N/A 03/30/2018   Procedure: CORONARY STENT INTERVENTION;  Surgeon: Martinique, Peter M, MD;  Location: [facility];  Service: Cardiovascular;  Laterality: N/A;  . ESOPHAGOGASTRODUODENOSCOPY (EGD) WITH PROPOFOL N/A 12/19/2020   Procedure: ESOPHAGOGASTRODUODENOSCOPY (EGD) WITH PROPOFOL;  Surgeon: Ladene Artist, MD;  Location[facility] END[facility];  Service: Endoscopy;  Laterality: N/A;  . GIVENS CAPSULE STUDY N/A 12/20/2020   Procedure: GIVENS CAPSULE STUDY;  Surgeon: Mauri Pole, MD;  Location: [facility];  Service: Endoscopy;  Laterality: N/A;  . INSERTION OF DIALYSIS CATHETER N/A 06/05/2016   Procedure: INSERTION OF DIALYSIS/trialysis CATHETER;  Surgeon: Ivin Poot, MD;  Location: [facility];  Service: Vascular;  Laterality: N/A;  . INSERTION OF DIALYSIS CATHETER Right 06/13/2016   Procedure: INSERTION OF DIALYSIS CATHETER RIGHT INTERNAL JUGULAR;  Surgeon: Conrad Hubbard, MD;  Location: [facility];  Service: Vascular;  Laterality: Right;  . INSERTION OF DIALYSIS CATHETER Right 09/07/2019   Procedure: Insertion Of Dialysis Catheter;  Surgeon: Rosetta Posner, MD;  Location: [facility];  Service: Vascular;  Laterality: Right;  . INTRAOPERATIVE TRANSESOPHAGEAL ECHOCARDIOGRAM N/A 06/05/2016   Procedure: INTRAOPERATIVE TRANSESOPHAGEAL ECHOCARDIOGRAM;   Surgeon: Ivin Poot, MD;  Location: [place];  Service: Open Heart Surgery;  Laterality: N/A;  . LEFT HEART CATH AND CORS/GRAFTS ANGIOGRAPHY N/A 11/03/2016   Procedure: Left Heart Cath and Cors/Grafts Angiography;  Surgeon: Troy Sine, MD;  Location: [place] [facility];  Service: Cardiovascular;  Laterality: N/A;  .  LEFT HEART CATH AND CORS/GRAFTS ANGIOGRAPHY N/A 07/07/2017   Procedure: LEFT HEART CATH AND CORS/GRAFTS ANGIOGRAPHY;  Surgeon: Larey Dresser, MD;  Location: [place] [facility];  Service: Cardiovascular;  Laterality: N/A;  . LEFT HEART CATH AND CORS/GRAFTS ANGIOGRAPHY N/A 03/30/2018   Procedure: LEFT HEART CATH AND CORS/GRAFTS ANGIOGRAPHY;  Surgeon: Martinique, Peter M, MD;  Location: [place] [facility];  Service: Cardiovascular;  Laterality: N/A;  . LEFT HEART CATH AND CORS/GRAFTS ANGIOGRAPHY N/A 04/17/2019   Procedure: LEFT HEART CATH AND CORS/GRAFTS ANGIOGRAPHY;  Surgeon: Larey Dresser, MD;  Location: [place] [facility];  Service: Cardiovascular;  Laterality: N/A;  . POLYPECTOMY  12/20/2020   Procedure: POLYPECTOMY;  Surgeon: Mauri Pole, MD;  Location: [facility];  Service: Endoscopy;;  . REVISION OF ARTERIOVENOUS GORETEX GRAFT Left [id number]   Procedure: PLICATION OF ANEURYSM OF ARTERIOVENOUS FISTULA  LEFT ARM;  Surgeon: Rosetta Posner, MD;  Location: [facility];  Service: Vascular;  Laterality: Left;  . REVISON OF ARTERIOVENOUS FISTULA Left 09/07/2019   Procedure: REVISON OF LEFT UPPER ARM ARTERIOVENOUS FISTULA;  Surgeon: Rosetta Posner, MD;  Location: [facility];  Service: Vascular;  Laterality: Left;    Family History: Family History  Problem Relation Age of Onset  . CAD Father   . Colon cancer Father   . Diabetes Brother     Social History: Social History   Socioeconomic History  . Marital status: Married    Spouse name: Not on file  . Number of children: Not on file  . Years of education: Not on file  . Highest education level: Not on file  Occupational History   . Not on file  Tobacco Use  . Smoking status: Former Smoker    Packs/day: 1.50    Years: 35.00    Pack years: 52.50    Types: Cigarettes    Quit date: 2014    Years since quitting: 8.3  . Smokeless tobacco: Never Used  . Tobacco comment: "quit e-cigs ~ 2014"  Vaping Use  . Vaping Use: Former  Substance and Sexual Activity  . Alcohol use: Never    Alcohol/week: 0.0 standard drinks  . Drug use: Never  . Sexual activity: Not on file  Other Topics Concern  . Not on file  Social History Narrative  . Not on file   Social Determinants of Health   Financial Resource Strain: Not on file  Food Insecurity: Not on file  Transportation Needs: Not on file  Physical Activity: Not on file  Stress: Not on file  Social Connections: Not on file    Allergies:  No Known Allergies  Objective:    Vital Signs:   Temp:  [98.2 F (36.8 C)-99.3 F (37.4 C)] 98.5 F (36.9 C) (04/21 0415) Pulse Rate:  [60-78] 62 (04/21 0415) Resp:  [15-24] 20 (04/21 0415) BP: (152-196)/(54-83) 152/59 (04/21 0633) SpO2:  [93 %-99 %] 99 % (04/21 0415) Weight:  [60.4 kg-64.6 kg] 60.4 kg (04/20 1745) Last BM Date: 12/31/20  Weight change: Filed Weights   01/01/21 0027 01/01/21 1357 01/01/21 1745  Weight: 66.7 kg 64.6 kg 60.4 kg    Intake/Output:   Intake/Output Summary (Last 24 hours) at 01/02/2021 0948 Last data filed at 01/02/2021 0000 Gross per 24 hour  Intake 360 ml  Output 4000 ml  Net -3640 ml      Physical Exam    General:  Well appearing. No resp difficulty HEENT: normal Neck: supple. JVP 8 cm. Carotids 2+ bilat; no  bruits. No lymphadenopathy or thyromegaly appreciated. Cor: PMI nondisplaced. Regular rate & rhythm. 2/6 murmur across precordium. Lungs: clear Abdomen: soft, nontender, nondistended. No hepatosplenomegaly. No bruits or masses. Good bowel sounds. Extremities: no cyanosis, clubbing, rash, edema Neuro: alert & orientedx3, cranial nerves grossly intact. moves all 4  extremities w/o difficulty. Affect pleasant   Telemetry   NSR 60s, personally reviewed  EKG    NSR, biventricular hypertrophy (personally reviewed)  Labs   Basic Metabolic Panel: Recent Labs  Lab 01/01/21 0045 01/02/21 0047  NA 131* 133*  K 5.6* 4.6  CL 88* 91*  CO2 28 31  GLUCOSE 203* 120*  BUN 54* 23  CREATININE 8.87* 6.02*  CALCIUM 7.9* 8.5*    Liver Function Tests: No results for input(s): AST, ALT, ALKPHOS, BILITOT, PROT, ALBUMIN in the last 168 hours. No results for input(s): LIPASE, AMYLASE in the last 168 hours. No results for input(s): AMMONIA in the last 168 hours.  CBC: Recent Labs  Lab 01/01/21 0045 01/01/21 1004 01/02/21 0047  WBC 6.9 7.5 11.3*  NEUTROABS 5.3  --   --   HGB 9.3* 9.8* 9.6*  HCT 28.9* 30.4* 29.5*  MCV 100.0 101.3* 99.7  PLT 122* 130* 152    Cardiac Enzymes: No results for input(s): CKTOTAL, CKMB, CKMBINDEX, TROPONINI in the last 168 hours.  BNP: BNP (last 3 results) Recent Labs    12/16/20 0406 01/01/21 1004  BNP 1,991.6* 3,450.4*    ProBNP (last 3 results) No results for input(s): PROBNP in the last 8760 hours.   CBG: Recent Labs  Lab 01/01/21 1735 01/01/21 1923 01/01/21 2355 01/02/21 0416 01/02/21 0813  GLUCAP 97 108* 140* 130* 206*    Coagulation Studies: No results for input(s): LABPROT, INR in the last 72 hours.   Imaging    No results found.   Medications:     Current Medications: . atorvastatin  80 mg Oral QAC lunch  . calcium acetate  667 mg Oral TID WC  . Chlorhexidine Gluconate Cloth  6 each Topical Q0600  . [START ON 01/03/2021] cinacalcet  60 mg Oral Q M,W,F-HD  . citalopram  20 mg Oral Q lunch  . [START ON 01/03/2021] doxercalciferol  2 mcg Intravenous Q M,W,F-HD  . ezetimibe  10 mg Oral Daily  . gabapentin  100 mg Oral Q lunch  . hydrALAZINE  50 mg Oral Q8H  . insulin aspart  0-9 Units Subcutaneous Q4H  . insulin glargine  5 Units Subcutaneous Daily  . isosorbide mononitrate  60  mg Oral Daily  . mupirocin ointment   Nasal BID  . sodium chloride flush  3 mL Intravenous Q12H     Infusions:    Assessment/Plan   1. CAD: s/p CABG.  NSTEMI 10/18, cath with 95% ostial SVG-ramus stenosis with TIMI 2 flow in graft, s/p DES to SVG-ramus 07/07/17. NSTEMI 7/19 with in-stent restenosis in the SVG-ramus, had DES x 2 to native ramus.   He has had chronic atypical chest pain episodes, Cardiolite in 3/20 showed no ischemia.  He had repeat cath in 8/20 showing occluded SVG-ramus (new) but continued patency of the native ramus s/p stenting.  No target for intervention.  He presented with nausea/vomiting, dyspnea (no chest pain) and was noted to have HS-TnI 1582 => 1673.  Possible NSTEMI, also could be volume overload with demand ischemia in setting of ESRD.  Difficult to differentiate, he is not markedly volume overloaded on exam today but had HD yesterday.  No overt bleeding now and  hgb is higher.  - Will plan repeat LHC/coronary angiography.  Discussed risks/benefits with patient and he agrees to procedure. Will plan cath tomorrow as he had breakfast and got Eliquis last night.  Hold Eliquis until after cath.  - No ASA due to Eliquis for Afib  - Continue Imdur 60 mg daily.  - Continue atorvastatin 80 mg daily.  2. Hyperlipidemia: LDL goal < 70 mg/dL, recent lipid panel 2/22 w/ LDL at 52. HFTs WNL  - Continue atorvastatin 80 + and Zetia 10 mg daily .  3. Chronic diastolic CHF:  Ischemic cardiomyopathy.  EF low in past but improved after CABG. Most recent echo 3/22 with EF 55-60%, moderate LVH.  Volume managed by HD.  Not markedly volume overloaded on exam today but had HD yesterday. ?If symptoms were due to volume overload and needs more fluid off with HD.      - Will do RHC with LHC tomorrow.  4. Atrial fibrillation: Paroxysmal. He has been in NSR.  - Hold Eliquis for cath.  5. HTN: Controlled on current regimen. No change  6. ESRD: HD MWF.     7. Carotid stenosis:  followed yearly.  Last study 5/21 showed 1-39% bilateral ICA stenosis + rt sided subclavian stenosis  8. Bilateral LE pain: Completed recent w/u to exclude PAD. LE arterial dopplers 2/22 normal. Rt ABI 1.04, Lt ABI 1.12.   Length of Stay: [place] 01/02/2021 10:52 AM  Advanced Heart Failure Team Pager [phone] (M-F; 7a - 5p)  Please contact [facility] for night-coverage after hours (4p -7a ) and weekends on [email or web address]

## 2021-01-02 NOTE — H&P (View-Only) (Signed)
Advanced Heart Failure Team Consult Note   Primary Physician: [name], MD PCP-Cardiologist:  No primary care provider on file.  Reason for Consultation: Heart Failure   HPI:    Kenneth [name] is seen today for evaluation of heart failure at the request of Dr Eliseo Squires.   Kenneth [name] is a 65 year old with a history of of type II diabetes, CKD stage IV requiring HD for a period of time, CAD, and chronic systolic CHF presents for cardiology followup.  He was admitted in 9/17 with NSTEMI.  LHC showed 3VD and he had CABG x 3.  He had CKD stage IV when he was admitted and developed ESRD requiring HD while in the hospital.  He had transient atrial fibrillation post-CABG.  He developed cardiogenic shock requiring milrinone but was able to taper off.  Echo in 9/17 showed EF 35-40%.    Admitted 8/1 through 04/16/2017 with malignant hypertension due to medication noncompliance. Continues on HD.  HF M-W-F. Discharge weight 140 pounds.   Admitted 10/18 with chest pain and pulmonary edema. NSTEMI noted on admit. Echo showed preserved EF but + WMAs. Cath with 95% ostial SVG-OM1 stenosis with TIMI 2 flow in graft. Patient had DES to SVG-OM1.   Admitted in 12/18 with atypical chest pain, suspect not cardiac.   NSTEMI in 7/19 with severe in-stent restenosis in SVG-OM1, had DES x 2 to native OM1.  Echo showed EF 50%.  Atrial fibrillation was noted this admission.   He has had a chronic pattern of atypical chest pain.  Cardiolite in 3/20 showed EF 54%, small fixed inferior defect with no ischemia.   He was admitted in 8/20 with severe episode of chest pain.  Hs-TnI was mildly elevated without trend.  Cath was done, showing no target for PCI and stable disease. Echo was done showing EF 50-55% with moderate LVH and normal RV.    Followed in the HF clinic and was seen in Feb/March. Had been having ongoing chest pain. Set up for stress test. ABIs ordered - LE arterial dopplers were normal. Rt ABI 1.04. Lt  ABI 1.12. Marland Kitchen Echo 11/28/20 EF 55-60% .  Admitted 12/15/20 with chest pain, bloody stool, and bradycardia. Atropine was given with improvement.  Hgb was 6.8.; patient transfused 1 unit of blood K+ 6.2 glucose 345. Hs troponin 44 to 745 . BB was stopped. With GI bleed and high risk of recurrent GI bleed and unknown source (colonscopy was done with blood in colon but no source of bleeding,  EGD showed  post ulcer deformity in duodenal bulb ) Because of this it was decided on medical management of CAD and NSTEMI.   He was transfused and placed on imdur and ranexa. Discharged on 12/25/20.   Presented to ED with nausea/vomiting and shortness of breath this admission. He has been in NSR, HR in 60s.  Ranolazine stopped with long QTc. No chest pain.  He had nausea/vomiting again today.   Pertinent admission labs [phone], BNP 3450, sodium 131, hgb 9.3, SARS 2 negative.     Review of Systems: [y] = yes, '[ ]'$  = no   . General: Weight gain '[ ]'$ ; Weight loss '[ ]'$ ; Anorexia '[ ]'$ ; Fatigue '[ ]'$ ; Fever '[ ]'$ ; Chills '[ ]'$ ; Weakness '[ ]'$   . Cardiac: Chest pain/pressure [Y ]; Resting SOB '[ ]'$ ; Exertional SOB '[ ]'$ ; Orthopnea '[ ]'$ ; Pedal Edema '[ ]'$ ; Palpitations '[ ]'$ ; Syncope '[ ]'$ ; Presyncope '[ ]'$ ; Paroxysmal nocturnal dyspnea'[ ]'$   .  Pulmonary: Cough '[ ]'$ ; Wheezing'[ ]'$ ; Hemoptysis'[ ]'$ ; Sputum '[ ]'$ ; Snoring '[ ]'$   . GI: Vomiting'[ ]'$ ; Dysphagia'[ ]'$ ; Melena'[ ]'$ ; Hematochezia '[ ]'$ ; Heartburn'[ ]'$ ; Abdominal pain '[ ]'$ ; Constipation '[ ]'$ ; Diarrhea '[ ]'$ ; BRBPR '[ ]'$   . GU: Hematuria'[ ]'$ ; Dysuria '[ ]'$ ; Nocturia'[ ]'$   . Vascular: Pain in legs with walking '[ ]'$ ; Pain in feet with lying flat '[ ]'$ ; Non-healing sores '[ ]'$ ; Stroke '[ ]'$ ; TIA '[ ]'$ ; Slurred speech '[ ]'$ ;  . Neuro: Headaches'[ ]'$ ; Vertigo'[ ]'$ ; Seizures'[ ]'$ ; Paresthesias'[ ]'$ ;Blurred vision '[ ]'$ ; Diplopia '[ ]'$ ; Vision changes '[ ]'$   . Ortho/Skin: Arthritis '[ ]'$ ; Joint pain '[ ]'$ ; Muscle pain '[ ]'$ ; Joint swelling '[ ]'$ ; Back Pain '[ ]'$ ; Rash '[ ]'$   . Psych: Depression'[ ]'$ ; Anxiety'[ ]'$   . Heme: Bleeding problems '[ ]'$ ; Clotting disorders [  ]; Anemia '[ ]'$   . Endocrine: Diabetes '[ ]'$ ; Thyroid dysfunction'[ ]'$   Home Medications Prior to Admission medications   Medication Sig Start Date End Date Taking? Authorizing Provider  acetaminophen (TYLENOL) 500 MG tablet Take 1,000 mg by mouth every 6 (six) hours as needed for mild pain or headache.    Yes [provider]  Ascorbic Acid (VITAMIN C) 1000 MG tablet Take 1,000 mg by mouth daily.   Yes [provider]  atorvastatin (LIPITOR) 80 MG tablet Take 1 tablet by mouth once daily Patient taking differently: Take 80 mg by mouth daily before lunch. 12/22/19  Yes Larey Dresser, MD  calcium acetate (PHOSLO) 667 MG capsule Take 667 mg by mouth 3 (three) times daily with meals.  07/09/17  Yes [provider]  citalopram (CELEXA) 20 MG tablet Take 20 mg by mouth daily with lunch.  02/25/17  Yes [provider]  ELIQUIS 5 MG TABS tablet Take 1 tablet by mouth twice daily Patient taking differently: Take 5 mg by mouth 2 (two) times daily. 02/06/20  Yes Larey Dresser, MD  ezetimibe (ZETIA) 10 MG tablet Take 1 tablet by mouth once daily Patient taking differently: Take 10 mg by mouth daily. 02/06/20  Yes Larey Dresser, MD  gabapentin (NEURONTIN) 100 MG capsule Take 100 mg by mouth daily with lunch.  02/11/17  Yes [provider]  Insulin Human (INSULIN PUMP) SOLN Inject into the skin continuous. NOVOLOG   Yes [provider]  isosorbide mononitrate (IMDUR) 60 MG 24 hr tablet Take 1 tablet by mouth once daily Patient taking differently: Take 60 mg by mouth daily. 05/27/20  Yes Larey Dresser, MD  NOVOLOG 100 UNIT/ML injection Inject 6-10 Units into the skin See admin instructions. Starts with 10 units per day and adds 6 units with each meal via pump 01/08/19  Yes [provider]  pantoprazole (PROTONIX) 40 MG tablet Take 40 mg by mouth daily before lunch.  01/07/17  Yes [provider]  ranolazine (RANEXA) 500 MG 12 hr tablet Take  1 tablet (500 mg total) by mouth 2 (two) times daily. 12/25/20 01/24/21 Yes Barb Merino, MD    Past Medical History: Past Medical History:  Diagnosis Date  . Acute respiratory failure with hypoxemia ([place]) 05/31/2016  . CAD (coronary artery disease) 10/30/2016   NSTEMI 9/17 with CABG x 3 (LIMA-LAD, SVG-OM, SVG-RCA).   - NSTEMI 10/18 s/p DES to ostial SVG to  OM  . Depression   . Diabetic foot ulcer ([place]) 04/11/2017  . 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 ESRD on hemodialysis ([place])    "  MWF; [facility]" (03/30/2018)  . Gastroesophageal reflux disease 08/18/2016  . HCAP (healthcare-associated pneumonia)   . Hyperlipidemia   . Hypertension   . Hypertensive heart disease 09/12/2017  . Ischemic rest pain of lower extremity 02/06/2015  . 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 NSTEMI (non-ST elevated myocardial infarction) ([place])   . PAF (paroxysmal atrial fibrillation) ([place])   . Pleural effusion   . Pronation deformity of both feet 02/27/2015    Past Surgical History: Past Surgical History:  Procedure Laterality Date  . AV FISTULA PLACEMENT Left 06/22/2016   Procedure: ARTERIOVENOUS (AV) FISTULA CREATION LEFT UPPER ARM;  Surgeon: Rosetta Posner, MD;  Location: [place];  Service: Vascular;  Laterality: Left;  . BIOPSY  12/19/2020   Procedure: BIOPSY;  Surgeon: Ladene Artist, MD;  Location: [place];  Service: Endoscopy;;  . CARDIAC CATHETERIZATION N/A 05/31/2016   Procedure: Left Heart Cath and Coronary Angiography;  Surgeon: Jettie Booze, MD;  Location: [place] [facility];  Service: Cardiovascular;  Laterality: N/A;  . CARDIAC CATHETERIZATION N/A 05/31/2016   Procedure: Right Heart Cath;  Surgeon: Jettie Booze, MD;  Location: [place] [facility];  Service: Cardiovascular;  Laterality: N/A;  . CARDIAC CATHETERIZATION N/A 05/31/2016   Procedure: IABP Insertion;  Surgeon: Jettie Booze, MD;  Location: [place] CV LAB;  Service:  Cardiovascular;  Laterality: N/A;  . COLONOSCOPY WITH PROPOFOL N/A 12/20/2020   Procedure: COLONOSCOPY WITH PROPOFOL;  Surgeon: Mauri Pole, MD;  Location: [place] [facility]OSCOPY;  Service: Endoscopy;  Laterality: N/A;  . CORONARY ARTERY BYPASS GRAFT N/A 06/05/2016   Procedure: CORONARY ARTERY BYPASS GRAFTING (CABG) x3 LIMA to LAD -SVG to OM -SVG to RCA;  Surgeon: Ivin Poot, MD;  Location: [place];  Service: Open Heart Surgery;  Laterality: N/A;  . CORONARY STENT INTERVENTION N/A 07/07/2017   Procedure: CORONARY STENT INTERVENTION;  Surgeon: Wellington Hampshire, MD;  Location: [place] [facility];  Service: Cardiovascular;  Laterality: N/A;  . CORONARY STENT INTERVENTION N/A 03/30/2018   Procedure: CORONARY STENT INTERVENTION;  Surgeon: Martinique, Peter M, MD;  Location: [place] CV [facility];  Service: Cardiovascular;  Laterality: N/A;  . ESOPHAGOGASTRODUODENOSCOPY (EGD) WITH PROPOFOL N/A 12/19/2020   Procedure: ESOPHAGOGASTRODUODENOSCOPY (EGD) WITH PROPOFOL;  Surgeon: Ladene Artist, MD;  Location: [facility] ENDOSCOPY;  Service: Endoscopy;  Laterality: N/A;  . GIVENS CAPSULE STUDY N/A 12/20/2020   Procedure: GIVENS CAPSULE STUDY;  Surgeon: Mauri Pole, MD;  Location: [place];  Service: Endoscopy;  Laterality: N/A;  . INSERTION OF DIALYSIS CATHETER N/A 06/05/2016   Procedure: INSERTION OF DIALYSIS/trialysis CATHETER;  Surgeon: Ivin Poot, MD;  Location: [place];  Service: Vascular;  Laterality: N/A;  . INSERTION OF DIALYSIS CATHETER Right 06/13/2016   Procedure: INSERTION OF DIALYSIS CATHETER RIGHT INTERNAL JUGULAR;  Surgeon: Conrad Toccoa, MD;  Location: [place];  Service: Vascular;  Laterality: Right;  . INSERTION OF DIALYSIS CATHETER Right 09/07/2019   Procedure: Insertion Of Dialysis Catheter;  Surgeon: Rosetta Posner, MD;  Location: [facility] OR;  Service: Vascular;  Laterality: Right;  . INTRAOPERATIVE TRANSESOPHAGEAL ECHOCARDIOGRAM N/A 06/05/2016   Procedure: INTRAOPERATIVE TRANSESOPHAGEAL ECHOCARDIOGRAM;   Surgeon: Ivin Poot, MD;  Location: [place];  Service: Open Heart Surgery;  Laterality: N/A;  . LEFT HEART CATH AND CORS/GRAFTS ANGIOGRAPHY N/A 11/03/2016   Procedure: Left Heart Cath and Cors/Grafts Angiography;  Surgeon: Troy Sine, MD;  Location: [place] CV LAB;  Service: Cardiovascular;  Laterality: N/A;  .  LEFT HEART CATH AND CORS/GRAFTS ANGIOGRAPHY N/A 07/07/2017   Procedure: LEFT HEART CATH AND CORS/GRAFTS ANGIOGRAPHY;  Surgeon: Larey Dresser, MD;  Location: [place] CV LAB;  Service: Cardiovascular;  Laterality: N/A;  . LEFT HEART CATH AND CORS/GRAFTS ANGIOGRAPHY N/A 03/30/2018   Procedure: LEFT HEART CATH AND CORS/GRAFTS ANGIOGRAPHY;  Surgeon: Martinique, Peter M, MD;  Location: [place] [facility] LAB;  Service: Cardiovascular;  Laterality: N/A;  . LEFT HEART CATH AND CORS/GRAFTS ANGIOGRAPHY N/A 04/17/2019   Procedure: LEFT HEART CATH AND CORS/GRAFTS ANGIOGRAPHY;  Surgeon: Larey Dresser, MD;  Location: [place] CV LAB;  Service: Cardiovascular;  Laterality: N/A;  . POLYPECTOMY  12/20/2020   Procedure: POLYPECTOMY;  Surgeon: Mauri Pole, MD;  Location: [place];  Service: Endoscopy;;  . REVISION OF ARTERIOVENOUS GORETEX GRAFT Left [id number]   Procedure: PLICATION OF ANEURYSM OF ARTERIOVENOUS FISTULA  LEFT ARM;  Surgeon: Rosetta Posner, MD;  Location: [place];  Service: Vascular;  Laterality: Left;  . REVISON OF ARTERIOVENOUS FISTULA Left 09/07/2019   Procedure: REVISON OF LEFT UPPER ARM ARTERIOVENOUS FISTULA;  Surgeon: Rosetta Posner, MD;  Location: [facility] OR;  Service: Vascular;  Laterality: Left;    Family History: Family History  Problem Relation Age of Onset  . CAD Father   . Colon cancer Father   . Diabetes Brother     Social History: Social History   Socioeconomic History  . Marital status: Married    Spouse name: Not on file  . Number of children: Not on file  . Years of education: Not on file  . Highest education level: Not on file  Occupational History   . Not on file  Tobacco Use  . Smoking status: Former Smoker    Packs/day: 1.50    Years: 35.00    Pack years: 52.50    Types: Cigarettes    Quit date: 2014    Years since quitting: 8.3  . Smokeless tobacco: Never Used  . Tobacco comment: "quit e-cigs ~ 2014"  Vaping Use  . Vaping Use: Former  Substance and Sexual Activity  . Alcohol use: Never    Alcohol/week: 0.0 standard drinks  . Drug use: Never  . Sexual activity: Not on file  Other Topics Concern  . Not on file  Social History Narrative  . Not on file   Social Determinants of Health   Financial Resource Strain: Not on file  Food Insecurity: Not on file  Transportation Needs: Not on file  Physical Activity: Not on file  Stress: Not on file  Social Connections: Not on file    Allergies:  No Known Allergies  Objective:    Vital Signs:   Temp:  [98.2 F (36.8 C)-99.3 F (37.4 C)] 98.5 F (36.9 C) (04/21 0415) Pulse Rate:  [60-78] 62 (04/21 0415) Resp:  [15-24] 20 (04/21 0415) BP: (152-196)/(54-83) 152/59 (04/21 0633) SpO2:  [93 %-99 %] 99 % (04/21 0415) Weight:  [60.4 kg-64.6 kg] 60.4 kg (04/20 1745) Last BM Date: 12/31/20  Weight change: Filed Weights   01/01/21 0027 01/01/21 1357 01/01/21 1745  Weight: 66.7 kg 64.6 kg 60.4 kg    Intake/Output:   Intake/Output Summary (Last 24 hours) at 01/02/2021 0948 Last data filed at 01/02/2021 0000 Gross per 24 hour  Intake 360 ml  Output 4000 ml  Net -3640 ml      Physical Exam    General:  Well appearing. No resp difficulty HEENT: normal Neck: supple. JVP 8 cm. Carotids 2+ bilat; no  bruits. No lymphadenopathy or thyromegaly appreciated. Cor: PMI nondisplaced. Regular rate & rhythm. 2/6 murmur across precordium. Lungs: clear Abdomen: soft, nontender, nondistended. No hepatosplenomegaly. No bruits or masses. Good bowel sounds. Extremities: no cyanosis, clubbing, rash, edema Neuro: alert & orientedx3, cranial nerves grossly intact. moves all 4  extremities w/o difficulty. Affect pleasant   Telemetry   NSR 60s, personally reviewed  EKG    NSR, biventricular hypertrophy (personally reviewed)  Labs   Basic Metabolic Panel: Recent Labs  Lab 01/01/21 0045 01/02/21 0047  NA 131* 133*  K 5.6* 4.6  CL 88* 91*  CO2 28 31  GLUCOSE 203* 120*  BUN 54* 23  CREATININE 8.87* 6.02*  CALCIUM 7.9* 8.5*    Liver Function Tests: No results for input(s): AST, ALT, ALKPHOS, BILITOT, PROT, ALBUMIN in the last 168 hours. No results for input(s): LIPASE, AMYLASE in the last 168 hours. No results for input(s): AMMONIA in the last 168 hours.  CBC: Recent Labs  Lab 01/01/21 0045 01/01/21 1004 01/02/21 0047  WBC 6.9 7.5 11.3*  NEUTROABS 5.3  --   --   HGB 9.3* 9.8* 9.6*  HCT 28.9* 30.4* 29.5*  MCV 100.0 101.3* 99.7  PLT 122* 130* 152    Cardiac Enzymes: No results for input(s): CKTOTAL, CKMB, CKMBINDEX, TROPONINI in the last 168 hours.  BNP: BNP (last 3 results) Recent Labs    12/16/20 0406 01/01/21 1004  BNP 1,991.6* 3,450.4*    ProBNP (last 3 results) No results for input(s): PROBNP in the last 8760 hours.   CBG: Recent Labs  Lab 01/01/21 1735 01/01/21 1923 01/01/21 2355 01/02/21 0416 01/02/21 0813  GLUCAP 97 108* 140* 130* 206*    Coagulation Studies: No results for input(s): LABPROT, INR in the last 72 hours.   Imaging    No results found.   Medications:     Current Medications: . atorvastatin  80 mg Oral QAC lunch  . calcium acetate  667 mg Oral TID WC  . Chlorhexidine Gluconate Cloth  6 each Topical Q0600  . [START ON 01/03/2021] cinacalcet  60 mg Oral Q M,W,F-HD  . citalopram  20 mg Oral Q lunch  . [START ON 01/03/2021] doxercalciferol  2 mcg Intravenous Q M,W,F-HD  . ezetimibe  10 mg Oral Daily  . gabapentin  100 mg Oral Q lunch  . hydrALAZINE  50 mg Oral Q8H  . insulin aspart  0-9 Units Subcutaneous Q4H  . insulin glargine  5 Units Subcutaneous Daily  . isosorbide mononitrate  60  mg Oral Daily  . mupirocin ointment   Nasal BID  . sodium chloride flush  3 mL Intravenous Q12H     Infusions:    Assessment/Plan   1. CAD: s/p CABG.  NSTEMI 10/18, cath with 95% ostial SVG-ramus stenosis with TIMI 2 flow in graft, s/p DES to SVG-ramus 07/07/17. NSTEMI 7/19 with in-stent restenosis in the SVG-ramus, had DES x 2 to native ramus.   He has had chronic atypical chest pain episodes, Cardiolite in 3/20 showed no ischemia.  He had repeat cath in 8/20 showing occluded SVG-ramus (new) but continued patency of the native ramus s/p stenting.  No target for intervention.  He presented with nausea/vomiting, dyspnea (no chest pain) and was noted to have HS-TnI 1582 => 1673.  Possible NSTEMI, also could be volume overload with demand ischemia in setting of ESRD.  Difficult to differentiate, he is not markedly volume overloaded on exam today but had HD yesterday.  No overt bleeding now and  hgb is higher.  - Will plan repeat LHC/coronary angiography.  Discussed risks/benefits with patient and he agrees to procedure. Will plan cath tomorrow as he had breakfast and got Eliquis last night.  Hold Eliquis until after cath.  - No ASA due to Eliquis for Afib  - Continue Imdur 60 mg daily.  - Continue atorvastatin 80 mg daily.  2. Hyperlipidemia: LDL goal < 70 mg/dL, recent lipid panel 2/22 w/ LDL at 52. HFTs WNL  - Continue atorvastatin 80 + and Zetia 10 mg daily .  3. Chronic diastolic CHF:  Ischemic cardiomyopathy.  EF low in past but improved after CABG. Most recent echo 3/22 with EF 55-60%, moderate LVH.  Volume managed by HD.  Not markedly volume overloaded on exam today but had HD yesterday. ?If symptoms were due to volume overload and needs more fluid off with HD.      - Will do RHC with LHC tomorrow.  4. Atrial fibrillation: Paroxysmal. He has been in NSR.  - Hold Eliquis for cath.  5. HTN: Controlled on current regimen. No change  6. ESRD: HD MWF.     7. Carotid stenosis:  followed yearly.  Last study 5/21 showed 1-39% bilateral ICA stenosis + rt sided subclavian stenosis  8. Bilateral LE pain: Completed recent w/u to exclude PAD. LE arterial dopplers 2/22 normal. Rt ABI 1.04, Lt ABI 1.12.   Length of Stay: [place] 01/02/2021 10:52 AM  Advanced Heart Failure Team Pager [phone] (M-F; 7a - 5p)  Please contact [facility] for night-coverage after hours (4p -7a ) and weekends on [email or web address]

## 2021-01-02 NOTE — Progress Notes (Signed)
[facility] Progress Note  Subjective: alert, breathing better. 4 L off w/ HD yest  Vitals:   01/01/21 2102 01/02/21 0000 01/02/21 0415 01/02/21 0633  BP: (!) 157/62 (!) 155/56 (!) 156/54 (!) 152/59  Pulse:  70 62   Resp:  19 20   Temp:  99.3 F (37.4 C) 98.5 F (36.9 C)   TempSrc:  Oral Oral   SpO2:  93% 99%   Weight:      Height:        Exam:  alert, nad   no jvd  Chest cta bilat  Cor reg no RG  Abd soft ntnd no ascites   Ext no LE edema   Alert, NF, ox3   LUE AVF+bruit     Home meds:  - phoslo 1 ac tid/ protonix 40 qd  - ranexa 500 bid/ zeita 10 qd/ imdur 60 qd/ lipitor 80 qd  - eliquis 5 bid  - insulin novolog per insulin pump  - neurontin 100 qd/ celexa 20 qd  - prn's/ vitamins/ supplements   CXR - FINDINGS: Mild cardiomegaly with calcific aortic atherosclerosis. Mild bilateral parahilar interstitial opacity without overt pulmonary edema.    OP HD: MWF AF  3h 19mn  400/500 59.5kg   2/2 bath  LUE AVF 15g  Hep none   - mircera 150 q2, next due 5/2   - hect 2 ug tiw  - sensipar '60mg'$  tiw   Assessment/ Plan: 1. SOB/ hypoxia - suspect vol overload and early pulm edema. 4 L UF yesterday w/o improved SOB. Need more vol off , will plan extra HD today, 3 L goal.  2. ESRD- on HD MWF. HD today off schedule. HD again tomorrow on schedule 3. Hypertension/volume- lower volume with HD 4. Anemiackd- continue outpatient ESA/Fe regimen 5. MBD ckd - Continue VDRA/Sensipar, binder 6. CAD - hxCABG /STEMI/stents 2018 /ISCM  7. Atrial fibrillation- on metoprolol, Eliquis 8. DM- per primary       Rob Davien Malone 01/02/2021, 10:29 AM   Recent Labs  Lab 01/01/21 0045 01/01/21 1004 01/02/21 0047  K 5.6*  --  4.6  BUN 54*  --  23  CREATININE 8.87*  --  6.02*  CALCIUM 7.9*  --  8.5*  HGB 9.3* 9.8* 9.6*   Inpatient medications: . atorvastatin  80 mg Oral QAC lunch  . calcium acetate  667 mg Oral TID WC  . Chlorhexidine Gluconate  Cloth  6 each Topical Q0600  . Chlorhexidine Gluconate Cloth  6 each Topical Q0600  . [START ON 01/03/2021] cinacalcet  60 mg Oral Q M,W,F-HD  . citalopram  20 mg Oral Q lunch  . [START ON 01/03/2021] doxercalciferol  2 mcg Intravenous Q M,W,F-HD  . ezetimibe  10 mg Oral Daily  . gabapentin  100 mg Oral Q lunch  . hydrALAZINE  50 mg Oral Q8H  . insulin aspart  0-9 Units Subcutaneous Q4H  . insulin glargine  5 Units Subcutaneous Daily  . isosorbide mononitrate  60 mg Oral Daily  . mupirocin ointment   Nasal BID  . sodium chloride flush  3 mL Intravenous Q12H    acetaminophen **OR** acetaminophen, albuterol, hydrALAZINE

## 2021-01-02 NOTE — Progress Notes (Signed)
Progress Note  Patient Name: Kenneth [name] Date of Encounter: 01/02/2021  [facility] Cardiologist:[place]  Subjective   Pt breathing better   No CP   Has had some R neck pain  (difficult with language; question if angina?)   Per son, pt does not activate EMS/come in until feeling really bad   Has not missed dialyses.  Inpatient Medications    Scheduled Meds: . apixaban  5 mg Oral BID  . atorvastatin  80 mg Oral QAC lunch  . calcium acetate  667 mg Oral TID WC  . Chlorhexidine Gluconate Cloth  6 each Topical Q0600  . [START ON 01/03/2021] cinacalcet  60 mg Oral Q M,W,F-HD  . citalopram  20 mg Oral Q lunch  . [START ON 01/03/2021] doxercalciferol  2 mcg Intravenous Q M,W,F-HD  . ezetimibe  10 mg Oral Daily  . gabapentin  100 mg Oral Q lunch  . hydrALAZINE  50 mg Oral Q8H  . insulin aspart  0-9 Units Subcutaneous Q4H  . insulin glargine  5 Units Subcutaneous Daily  . isosorbide mononitrate  60 mg Oral Daily  . mupirocin ointment   Nasal BID  . sodium chloride flush  3 mL Intravenous Q12H   Continuous Infusions:  PRN Meds: acetaminophen **OR** acetaminophen, albuterol, hydrALAZINE   Vital Signs    Vitals:   01/01/21 2102 01/02/21 0000 01/02/21 0415 01/02/21 0633  BP: (!) 157/62 (!) 155/56 (!) 156/54 (!) 152/59  Pulse:  70 62   Resp:  19 20   Temp:  99.3 F (37.4 C) 98.5 F (36.9 C)   TempSrc:  Oral Oral   SpO2:  93% 99%   Weight:      Height:        Intake/Output Summary (Last 24 hours) at 01/02/2021 0803 Last data filed at 01/02/2021 0000 Gross per 24 hour  Intake 480 ml  Output 4000 ml  Net -3520 ml   Last 3 Weights 01/01/2021 01/01/2021 01/01/2021  Weight (lbs) 133 lb 2.5 oz 142 lb 6.7 oz 147 lb 0.8 oz  Weight (kg) 60.4 kg 64.6 kg 66.7 kg      Telemetry    SR  - Personally Reviewed  ECG    No new - Personally Reviewed  Physical Exam   GEN: Thin 65 yo  acute distress.   Neck: No JVD Cardiac: RRR, no murmurs,  Respiratory: Clear to auscultation  bilaterally. GI: Soft, nontender, non-distended  MS: No edema; No deformity. Neuro:  Nonfocal  Psych: Normal affect   Labs    High Sensitivity Troponin:   Recent Labs  Lab 12/16/20 2334 12/17/20 0439 12/17/20 0623 01/01/21 0045 01/01/21 0319  TROPONINIHS 562* 745* 735* 1,582* 1,673*      Chemistry Recent Labs  Lab 01/01/21 0045 01/02/21 0047  NA 131* 133*  K 5.6* 4.6  CL 88* 91*  CO2 28 31  GLUCOSE 203* 120*  BUN 54* 23  CREATININE 8.87* 6.02*  CALCIUM 7.9* 8.5*  GFRNONAA 6* 10*  ANIONGAP 15 11     Hematology Recent Labs  Lab 01/01/21 0045 01/01/21 1004 01/02/21 0047  WBC 6.9 7.5 11.3*  RBC 2.89* 3.00* 2.96*  HGB 9.3* 9.8* 9.6*  HCT 28.9* 30.4* 29.5*  MCV 100.0 101.3* 99.7  MCH 32.2 32.7 32.4  MCHC 32.2 32.2 32.5  RDW 15.5 15.1 14.9  PLT 122* 130* 152    BNP Recent Labs  Lab 01/01/21 1004  BNP 3,450.4*     DDimer No results for input(s):  DDIMER in the last 168 hours.   Radiology    DG Chest 2 View  Result Date: 01/01/2021 CLINICAL DATA:  Hemoptysis EXAM: CHEST - 2 VIEW COMPARISON:  12/22/2020 FINDINGS: Mild cardiomegaly with calcific aortic atherosclerosis. Mild bilateral parahilar interstitial opacity without overt pulmonary edema. No pleural effusion or pneumothorax. Left subclavian stent. IMPRESSION: Mild cardiomegaly without overt pulmonary edema Electronically Signed   By: Ulyses Jarred M.D.   On: 01/01/2021 01:23    Cardiac Studies     Patient Profile      Kenneth [name] is a 65 y.o. male with a hx of DM-1 on insulin pump, ESRD with HD MWF, CAD, chronic systolic CHF, NSTEMI in [id number] 3v dz and then CABG X 3 and in 2018 graft stenosis with DES to VG-OM1 and restenosis in 2019 with DES X 2 to native OM1, last cath 2020 with occluded VG to RCA and VG to ramus and patent LIMA to LAD. Large ramus was patent with 40-50% mid ramus stenosis, in tent restenosis)  80% ostial stenosis very small caliber AV Lcx occluded RCA chronic with  collaterals.  PAF   Hx cardiogenic shock on milrinone with hospitalization, non compliance  and weaned off who is being seen today for the evaluation of CHF and elevated troponins at the request of Dr. Tamala Julian.  Assessment & Plan    1 NSTEMI / CAD  Pt with recent admit   NSTEMI in setting profound anemia    Had been seen prior with some atypical pains and myovue planned  Note LVEF normal  Presented yesterday with severe SOB, N/V and neck pain   Has undergone dialysis   Note troponins signficantly elevated     CUrrently appears MUCH more comfortable   No CP   No neck pain   I have reviewed with D McLean who will see pt   PT with severe CAD with closure of 2 grafts   Given such abrupt presentation in setting of Hgb that is ow better and the signif trop bump would recomm L heart cath to defne anatomy.   ALso recomm R heart cath to determine filling pressures        Ranexa stopped with prolonged QT  Will follow   May reflect other things (ie dialysis state) as well    2  Rhythm     Hx  PAF   COntineu Eliqus after cath    NO significant bradycardia    3  HTN  BP remains elevated  Note patient going for repeat dialysis today     4  HL  Keep on statin and Zetia    5  ESRD  For another dialysis today .  For questions or updates, please contact [place] Please consult [email or web address] for contact info under        Signed, Dorris Carnes, MD  01/02/2021, 8:03 AM

## 2021-01-02 NOTE — Progress Notes (Addendum)
Progress Note    Kenneth [name]  [id number] DOB: [birth year]  DOA: 01/01/2021 PCP: [name], MD    Brief Narrative:     Medical records reviewed and are as summarized below:  Kenneth [name] is an 65 y.o. male  with medical history significant of HTN, HLD, PAF on Eliquis, ESRD on HD(M, W, F), CAD sp CABG, diastolic CHF, and DM type 1 presents with complaints coughing up blood and shortness of breath.  History is obtained with use of patient's nurse acting as Optometrist.  Recently hospitalized from 4/4-4/13 with suspected GI bleed with hemoglobin down to 6.7 requiring transfusion with no clear source found on EGD or colonoscopy, symptomatic bradycardia for which beta-blocker was discontinued.    Assessment/Plan:   Active Problems:   ESRD (end stage renal disease) (HCC)   CAD (coronary artery disease)   Chest pain   Hypertensive urgency   Anemia, chronic renal failure   Fluid overload   Hyperkalemia   Hyperlipidemia   AF (paroxysmal atrial fibrillation) (HCC)   Acute hypoxemic respiratory failure (HCC)   Prolonged QT interval   Hemoptysis    Acute hypoxemic respiratory failure suspected secondary to fluid overload diastolic congestive heart failure exacerbation: On admission patient was noted to have O2 saturations in the mid to upper 80s temporarily requiring placement on 2 L nasal cannula oxygen.  Chest x-ray significant for cardiomegaly without overt edema.  On physical exam patient with noticeable JVD.  Last echocardiogram from 11/28/2020 revealed EF of 55 to 60% with grade 2 diastolic dysfunction.  Reports recently being 3 to 5 pounds over his dry weight. -wean O2 to off  Chest pain elevated troponin: Acute on chronic.  Patient reports some complaints of chest pressure/stiffness.  High-sensitivity troponin 1582-> 1673, but during previous hospitalization ranged from 44-745.  Evaluated by cardiology and thought secondary to demand ischemia at that time.  Suspect  symptoms could be similar during this admission as patient appears to be fluid overloaded. -LHC on 4/22  Hemoptysis anemia of chronic disease: Patient reports having 3 episodes of coughing bright red blood.  Hemoglobin 9.3 g/dL which appears improved from previous of 8.3 on 4/13.  Patient just recently had EGD and colonoscopy on 4/7/8 which did not reveal any clear signs of bleeding.  -trend  Hyperkalemia: Acute.  Potassium elevated at 5.6.  EKG without significant changes -To be addressed with hemodialysis  Bradycardia: Heart rates in the emergency department in 50s to 60s initially during last hospitalization patient had been seen to have symptomatic bradycardia for which metoprolol and amlodipine have been discontinued  -Follow-up telemetry  Hypertensive urgency: Acute.  On admission blood pressures elevated up to 183/66.  During last admission patient's medications of metoprolol, amlodipine, hydralazine, and losartan had all been discontinued. -Hydralazine 50 mg 3 times daily  Diabetes mellitus type 1 with hypoglycemia: Acute.  Patient noted to have blood glucose as low as 65 on admission.  Glucose improved after patient was given orange juice.  He is on insulin pump, but had reportedly been having issues at home with low blood sugars per the patient's wife. -DM coordinator consult -low dose lantus, SSI  Prolonged QT interval: Acute.  On admission QTC elevated at 542. -Avoid QT prolonging medications -Correcting electrolyte abnormalities  ESRD on HD: Patient normally dialyzes Monday, Wednesday, Friday.  He reports that he was 3 to 5 pounds over his dry weight when he went to hemodialysis on 4/18 and they removed 2 pounds of  fluid. -Continue calcium acetate -Nephrology consulted- s/p HD 4/20 and 4/21  Paroxysmal atrial fibrillation on chronic anticoagulation:   CHA2DS2-VASc score = 4. Reports compliance with Eliquis that was restarted at discharge however it was noted that  patient was only taking Eliquis once daily. -Continue Eliquis 5 mg twice daily unless hemoglobin drops   CAD s/p CABG  Hyperlipidemia -Continue atorvastatin and Zetia  GERD -On Protonix  Per patient: since starting ranolozine at last hospital visit (12/25/20), his hands and feet are shaking/tremors, getting worse- -will hold this med for now     Family Communication/Anticipated D/C date and plan/Code Status   DVT prophylaxis: eliquis Code Status: Full Code.   Disposition Plan: Status is: Inpatient  Remains inpatient appropriate because:Inpatient level of care appropriate due to severity of illness   Dispo: The patient is from: Home              Anticipated d/c is to: Home              Patient currently is not medically stable to d/c.   Difficult to place patient No         Medical Consultants:    Renal  cards  Subjective:   Going to HD  Objective:    Vitals:   01/02/21 1110 01/02/21 1130 01/02/21 1200 01/02/21 1230  BP: (!) 155/64  140/78 (!) 131/58  Pulse:      Resp: (!) '21 19 17 '$ (!) 22  Temp:      TempSrc:      SpO2:      Weight:      Height:        Intake/Output Summary (Last 24 hours) at 01/02/2021 1320 Last data filed at 01/02/2021 0800 Gross per 24 hour  Intake 480 ml  Output 4000 ml  Net -3520 ml   Filed Weights   01/01/21 1357 01/01/21 1745 01/02/21 1100  Weight: 64.6 kg 60.4 kg 62 kg    Exam:  General: Appearance:    Well developed, well nourished male in no acute distress     Lungs:      respirations unlabored  Heart:    Normal heart rate.  No murmurs, rubs, or gallops.   MS:   All extremities are intact.   Neurologic:   Awake, alert, oriented x 3    Data Reviewed:   I have personally reviewed following labs and imaging studies:  Labs: Labs show the following:   Basic Metabolic Panel: Recent Labs  Lab 01/01/21 0045 01/02/21 0047  NA 131* 133*  K 5.6* 4.6  CL 88* 91*  CO2 28 31  GLUCOSE 203* 120*  BUN 54*  23  CREATININE 8.87* 6.02*  CALCIUM 7.9* 8.5*   GFR Estimated Creatinine Clearance: 11 mL/min (A) (by C-G formula based on SCr of 6.02 mg/dL (H)). Liver Function Tests: No results for input(s): AST, ALT, ALKPHOS, BILITOT, PROT, ALBUMIN in the last 168 hours. No results for input(s): LIPASE, AMYLASE in the last 168 hours. No results for input(s): AMMONIA in the last 168 hours. Coagulation profile No results for input(s): INR, PROTIME in the last 168 hours.  CBC: Recent Labs  Lab 01/01/21 0045 01/01/21 1004 01/02/21 0047  WBC 6.9 7.5 11.3*  NEUTROABS 5.3  --   --   HGB 9.3* 9.8* 9.6*  HCT 28.9* 30.4* 29.5*  MCV 100.0 101.3* 99.7  PLT 122* 130* 152   Cardiac Enzymes: No results for input(s): CKTOTAL, CKMB, CKMBINDEX, TROPONINI in the last 168 hours.  BNP (last 3 results) No results for input(s): PROBNP in the last 8760 hours. CBG: Recent Labs  Lab 01/01/21 1735 01/01/21 1923 01/01/21 2355 01/02/21 0416 01/02/21 0813  GLUCAP 97 108* 140* 130* 206*   D-Dimer: No results for input(s): DDIMER in the last 72 hours. Hgb A1c: No results for input(s): HGBA1C in the last 72 hours. Lipid Profile: No results for input(s): CHOL, HDL, LDLCALC, TRIG, CHOLHDL, LDLDIRECT in the last 72 hours. Thyroid function studies: No results for input(s): TSH, T4TOTAL, T3FREE, THYROIDAB in the last 72 hours.  Invalid input(s): FREET3 Anemia work up: No results for input(s): VITAMINB12, FOLATE, FERRITIN, TIBC, IRON, RETICCTPCT in the last 72 hours. Sepsis Labs: Recent Labs  Lab 01/01/21 0045 01/01/21 1004 01/02/21 0047  WBC 6.9 7.5 11.3*    Microbiology Recent Results (from the past 240 hour(s))  MRSA PCR Screening     Status: Abnormal   Collection Time: 01/01/21  6:26 AM   Specimen: Nasal Mucosa; Nasopharyngeal  Result Value Ref Range Status   MRSA by PCR POSITIVE (A) NEGATIVE Final    Comment:        The GeneXpert MRSA Assay (FDA approved for NASAL specimens only), is one  component of a comprehensive MRSA colonization surveillance program. It is not intended to diagnose MRSA infection nor to guide or monitor treatment for MRSA infections. RESULT CALLED TO, READ BACK BY AND VERIFIED WITH: RN HYESUK LEE [id number] AT 816 AM BY CM Performed at [facility], [place] [address] [place], [place] [address]   Resp Panel by RT-PCR (Flu A&B, Covid)     Status: None   Collection Time: 01/01/21 12:26 PM  Result Value Ref Range Status   SARS Coronavirus 2 by RT PCR NEGATIVE NEGATIVE Final    Comment: (NOTE) SARS-CoV-2 target nucleic acids are NOT DETECTED.  The SARS-CoV-2 RNA is generally detectable in upper respiratory specimens during the acute phase of infection. The lowest concentration of SARS-CoV-2 viral copies this assay can detect is 138 copies/mL. A negative result does not preclude SARS-Cov-2 infection and should not be used as the sole basis for treatment or other patient management decisions. A negative result may occur with  improper specimen collection/handling, submission of specimen other than nasopharyngeal swab, presence of viral mutation(s) within the areas targeted by this assay, and inadequate number of viral copies(<138 copies/mL). A negative result must be combined with clinical observations, patient history, and epidemiological information. The expected result is Negative.  Fact Sheet for Patients:  EntrepreneurPulse.com.au  Fact Sheet for Healthcare Providers:  IncredibleEmployment.be  This test is no t yet approved or cleared by the [place] FDA and  has been authorized for detection and/or diagnosis of SARS-CoV-2 by FDA under an Emergency Use Authorization (EUA). This EUA will remain  in effect (meaning this test can be used) for the duration of the COVID-19 declaration under Section 564(b)(1) of the Act, 21 U.S.C.section 360bbb-3(b)(1), unless the authorization is terminated  or revoked  sooner.       Influenza A by PCR NEGATIVE NEGATIVE Final   Influenza B by PCR NEGATIVE NEGATIVE Final    Comment: (NOTE) The Xpert Xpress SARS-CoV-2/FLU/RSV plus assay is intended as an aid in the diagnosis of influenza from Nasopharyngeal swab specimens and should not be used as a sole basis for treatment. Nasal washings and aspirates are unacceptable for Xpert Xpress SARS-CoV-2/FLU/RSV testing.  Fact Sheet for Patients: EntrepreneurPulse.com.au  Fact Sheet for Healthcare Providers: IncredibleEmployment.be  This test is not yet approved  or cleared by the [place] and has been authorized for detection and/or diagnosis of SARS-CoV-2 by FDA under an Emergency Use Authorization (EUA). This EUA will remain in effect (meaning this test can be used) for the duration of the COVID-19 declaration under Section 564(b)(1) of the Act, 21 U.S.C. section 360bbb-3(b)(1), unless the authorization is terminated or revoked.  Performed at [facility], [place] [address], [place], [place] [address]     Procedures and diagnostic studies:  DG Chest 2 View  Result Date: 01/01/2021 CLINICAL DATA:  Hemoptysis EXAM: CHEST - 2 VIEW COMPARISON:  12/22/2020 FINDINGS: Mild cardiomegaly with calcific aortic atherosclerosis. Mild bilateral parahilar interstitial opacity without overt pulmonary edema. No pleural effusion or pneumothorax. Left subclavian stent. IMPRESSION: Mild cardiomegaly without overt pulmonary edema Electronically Signed   By: Ulyses Jarred M.D.   On: 01/01/2021 01:23    Medications:   . atorvastatin  80 mg Oral QAC lunch  . calcium acetate  667 mg Oral TID WC  . Chlorhexidine Gluconate Cloth  6 each Topical Q0600  . Chlorhexidine Gluconate Cloth  6 each Topical Q0600  . [START ON 01/03/2021] cinacalcet  60 mg Oral Q M,W,F-HD  . citalopram  20 mg Oral Q lunch  . [START ON 01/03/2021] doxercalciferol  2 mcg Intravenous Q M,W,F-HD  .  ezetimibe  10 mg Oral Daily  . gabapentin  100 mg Oral Q lunch  . hydrALAZINE  50 mg Oral Q8H  . insulin aspart  0-9 Units Subcutaneous Q4H  . insulin glargine  5 Units Subcutaneous Daily  . isosorbide mononitrate  60 mg Oral Daily  . mupirocin ointment   Nasal BID  . sodium chloride flush  3 mL Intravenous Q12H  . sodium chloride flush  3 mL Intravenous Q12H   Continuous Infusions:   LOS: 1 day   [place] [facility]   How to contact the [facility] Attending or Consulting provider [place] or covering provider during after hours [place], for this patient?  1. Check the care team in [facility] and look for a) attending/consulting [facility] provider listed and b) the [facility] team listed 2. Log into [email or web address] and use [place]'s universal password to access. If you do not have the password, please contact the hospital operator. 3. Locate the [facility] provider you are looking for under [facility] and page to a number that you can be directly reached. 4. If you still have difficulty reaching the provider, please page the [facility] (Director on Call) for the Hospitalists listed on amion for assistance.  01/02/2021, 1:20 PM

## 2021-01-03 ENCOUNTER — Encounter (HOSPITAL_COMMUNITY): Payer: Self-pay | Admitting: Cardiology

## 2021-01-03 ENCOUNTER — Encounter (HOSPITAL_COMMUNITY)
Admission: EM | Disposition: A | Discharge status: Home Health | Payer: Self-pay | Source: Home / Self Care | Attending: Internal Medicine

## 2021-01-03 DIAGNOSIS — I25810 Atherosclerosis of coronary artery bypass graft(s) without angina pectoris: Secondary | ICD-10-CM

## 2021-01-03 DIAGNOSIS — R0600 Dyspnea, unspecified: Secondary | ICD-10-CM

## 2021-01-03 HISTORY — PX: RIGHT/LEFT HEART CATH AND CORONARY ANGIOGRAPHY: CATH118266

## 2021-01-03 LAB — CBC
HCT: 32.3 % — ABNORMAL LOW (ref 39.0–52.0)
Hemoglobin: 10.5 g/dL — ABNORMAL LOW (ref 13.0–17.0)
MCH: 32.4 pg (ref 26.0–34.0)
MCHC: 32.5 g/dL (ref 30.0–36.0)
MCV: 99.7 fL (ref 80.0–100.0)
Platelets: 178 10*3/uL (ref 150–400)
RBC: 3.24 MIL/uL — ABNORMAL LOW (ref 4.22–5.81)
RDW: 14.6 % (ref 11.5–15.5)
WBC: 6.6 10*3/uL (ref 4.0–10.5)
nRBC: 0 % (ref 0.0–0.2)

## 2021-01-03 LAB — GLUCOSE, CAPILLARY
Glucose-Capillary: 105 mg/dL — ABNORMAL HIGH (ref 70–99)
Glucose-Capillary: 107 mg/dL — ABNORMAL HIGH (ref 70–99)
Glucose-Capillary: 114 mg/dL — ABNORMAL HIGH (ref 70–99)
Glucose-Capillary: 134 mg/dL — ABNORMAL HIGH (ref 70–99)
Glucose-Capillary: 301 mg/dL — ABNORMAL HIGH (ref 70–99)
Glucose-Capillary: 77 mg/dL (ref 70–99)

## 2021-01-03 LAB — BASIC METABOLIC PANEL
Anion gap: 11 (ref 5–15)
BUN: 37 mg/dL — ABNORMAL HIGH (ref 8–23)
CO2: 30 mmol/L (ref 22–32)
Calcium: 8.7 mg/dL — ABNORMAL LOW (ref 8.9–10.3)
Chloride: 95 mmol/L — ABNORMAL LOW (ref 98–111)
Creatinine, Ser: 5.92 mg/dL — ABNORMAL HIGH (ref 0.61–1.24)
GFR, Estimated: 10 mL/min — ABNORMAL LOW (ref >60)
Glucose, Bld: 125 mg/dL — ABNORMAL HIGH (ref 70–99)
Potassium: 4.5 mmol/L (ref 3.5–5.1)
Sodium: 136 mmol/L (ref 135–145)

## 2021-01-03 LAB — POCT I-STAT EG7
Acid-Base Excess: 3 mmol/L — ABNORMAL HIGH (ref 0.0–2.0)
Acid-Base Excess: 3 mmol/L — ABNORMAL HIGH (ref 0.0–2.0)
Bicarbonate: 28.2 mmol/L — ABNORMAL HIGH (ref 20.0–28.0)
Bicarbonate: 28.3 mmol/L — ABNORMAL HIGH (ref 20.0–28.0)
Calcium, Ion: 1.1 mmol/L — ABNORMAL LOW (ref 1.15–1.40)
Calcium, Ion: 1.11 mmol/L — ABNORMAL LOW (ref 1.15–1.40)
HCT: 29 % — ABNORMAL LOW (ref 39.0–52.0)
HCT: 29 % — ABNORMAL LOW (ref 39.0–52.0)
Hemoglobin: 9.9 g/dL — ABNORMAL LOW (ref 13.0–17.0)
Hemoglobin: 9.9 g/dL — ABNORMAL LOW (ref 13.0–17.0)
O2 Saturation: 79 %
O2 Saturation: 79 %
Potassium: 4.4 mmol/L (ref 3.5–5.1)
Potassium: 4.4 mmol/L (ref 3.5–5.1)
Sodium: 134 mmol/L — ABNORMAL LOW (ref 135–145)
Sodium: 134 mmol/L — ABNORMAL LOW (ref 135–145)
TCO2: 30 mmol/L (ref 22–32)
TCO2: 30 mmol/L (ref 22–32)
pCO2, Ven: 46.4 mmHg (ref 44.0–60.0)
pCO2, Ven: 46.8 mmHg (ref 44.0–60.0)
pH, Ven: 7.39 (ref 7.250–7.430)
pH, Ven: 7.391 (ref 7.250–7.430)
pO2, Ven: 44 mmHg (ref 32.0–45.0)
pO2, Ven: 45 mmHg (ref 32.0–45.0)

## 2021-01-03 SURGERY — RIGHT/LEFT HEART CATH AND CORONARY ANGIOGRAPHY
Anesthesia: LOCAL

## 2021-01-03 MED ORDER — LIDOCAINE HCL (PF) 1 % IJ SOLN
INTRAMUSCULAR | Status: AC
  Filled 2021-01-03: qty 30

## 2021-01-03 MED ORDER — VITAMIN B-12 1000 MCG PO TABS
500.0000 ug | ORAL_TABLET | Freq: Every day | ORAL | Status: DC
  Administered 2021-01-03 – 2021-01-04 (×2): 500 ug via ORAL
  Filled 2021-01-03 (×2): qty 1

## 2021-01-03 MED ORDER — HEPARIN (PORCINE) IN NACL 1000-0.9 UT/500ML-% IV SOLN
INTRAVENOUS | Status: AC
  Filled 2021-01-03: qty 1000

## 2021-01-03 MED ORDER — MIDAZOLAM HCL 2 MG/2ML IJ SOLN
INTRAMUSCULAR | Status: AC
  Filled 2021-01-03: qty 2

## 2021-01-03 MED ORDER — LIDOCAINE HCL (PF) 1 % IJ SOLN
INTRAMUSCULAR | Status: DC | PRN
  Administered 2021-01-03: 10 mL

## 2021-01-03 MED ORDER — MIDAZOLAM HCL 2 MG/2ML IJ SOLN
INTRAMUSCULAR | Status: DC | PRN
  Administered 2021-01-03: 1 mg via INTRAVENOUS

## 2021-01-03 MED ORDER — SODIUM CHLORIDE 0.9 % IV SOLN: 250.0000 mL | INTRAVENOUS | Status: DC | PRN

## 2021-01-03 MED ORDER — SODIUM CHLORIDE 0.9% FLUSH: 3.0000 mL | INTRAVENOUS | Status: DC | PRN

## 2021-01-03 MED ORDER — APIXABAN 2.5 MG PO TABS
2.5000 mg | ORAL_TABLET | Freq: Two times a day (BID) | ORAL | Status: DC
  Administered 2021-01-04: 2.5 mg via ORAL
  Filled 2021-01-03: qty 1

## 2021-01-03 MED ORDER — LABETALOL HCL 5 MG/ML IV SOLN: 10.0000 mg | INTRAVENOUS | Status: AC | PRN

## 2021-01-03 MED ORDER — HEPARIN (PORCINE) IN NACL 1000-0.9 UT/500ML-% IV SOLN
INTRAVENOUS | Status: DC | PRN
  Administered 2021-01-03 (×2): 500 mL

## 2021-01-03 MED ORDER — SODIUM CHLORIDE 0.9% FLUSH: 3.0000 mL | Freq: Two times a day (BID) | INTRAVENOUS | Status: DC

## 2021-01-03 MED ORDER — ONDANSETRON HCL 4 MG/2ML IJ SOLN
4.0000 mg | Freq: Four times a day (QID) | INTRAMUSCULAR | Status: DC | PRN
Start: 2021-01-03 — End: 2021-01-04

## 2021-01-03 MED ORDER — FENTANYL CITRATE (PF) 100 MCG/2ML IJ SOLN
INTRAMUSCULAR | Status: AC
  Filled 2021-01-03: qty 2

## 2021-01-03 MED ORDER — FENTANYL CITRATE (PF) 100 MCG/2ML IJ SOLN
INTRAMUSCULAR | Status: DC | PRN
  Administered 2021-01-03: 25 ug via INTRAVENOUS

## 2021-01-03 MED ORDER — IOHEXOL 350 MG/ML SOLN
INTRAVENOUS | Status: DC | PRN
  Administered 2021-01-03: 90 mL

## 2021-01-03 MED ORDER — ACETAMINOPHEN 325 MG PO TABS: 650.0000 mg | ORAL_TABLET | ORAL | Status: DC | PRN

## 2021-01-03 MED ORDER — HYDRALAZINE HCL 20 MG/ML IJ SOLN: 10.0000 mg | INTRAMUSCULAR | Status: AC | PRN

## 2021-01-03 SURGICAL SUPPLY — 9 items
CATH INFINITI 5FR MULTPACK ANG (CATHETERS) ×2 IMPLANT
CATH SWAN GANZ 7F STRAIGHT (CATHETERS) ×2 IMPLANT
KIT HEART LEFT (KITS) ×2 IMPLANT
PACK CARDIAC CATHETERIZATION (CUSTOM PROCEDURE TRAY) ×2 IMPLANT
SHEATH PINNACLE 5F 10CM (SHEATH) ×2 IMPLANT
SHEATH PINNACLE 7F 10CM (SHEATH) ×2 IMPLANT
SHEATH PROBE COVER 6X72 (BAG) ×2 IMPLANT
TRANSDUCER W/STOPCOCK (MISCELLANEOUS) ×2 IMPLANT
WIRE EMERALD 3MM-J .035X150CM (WIRE) ×2 IMPLANT

## 2021-01-03 NOTE — Progress Notes (Addendum)
Site area: rt groin fa and fv sheaths pulled and pressure held by Raynelle Dick Site Prior to Removal:  Level 0 Pressure Applied For: 20 minutes Manual:   yes Patient Status During Pull:  stable Post Pull Site:  Level 0 Post Pull Instructions Given:  Yes w/translator Post Pull Pulses Present: rt dp palpable Dressing Applied:  Gauze and tegaderm Bedrest begins @ [id number] Comments:

## 2021-01-03 NOTE — Care Management Important Message (Signed)
Important Message  Patient Details  Name: Kenneth [name] MRN: [id number] Date of Birth: 1956-03-09   Medicare Important Message Given:  Yes     Raynesha Tiedt [address] 01/03/2021, 1:58 PM

## 2021-01-03 NOTE — Progress Notes (Signed)
Progress Note    Kenneth [name]  [id number] DOB: 02-03-1956  DOA: 01/01/2021 PCP: [name], MD    Brief Narrative:     Medical records reviewed and are as summarized below:  Kenneth [name] is an 65 y.o. male  with medical history significant of HTN, HLD, PAF on Eliquis, ESRD on HD(M, W, F), CAD sp CABG, diastolic CHF, and DM type 1 presents with complaints coughing up blood and shortness of breath.  History is obtained with use of patient's nurse acting as Optometrist.  Recently hospitalized from 4/4-4/13 with suspected GI bleed with hemoglobin down to 6.7 requiring transfusion with no clear source found on EGD or colonoscopy, symptomatic bradycardia for which beta-blocker was discontinued.    Assessment/Plan:   Active Problems:   ESRD (end stage renal disease) (HCC)   CAD (coronary artery disease)   Chest pain   Hypertensive urgency   Anemia, chronic renal failure   Fluid overload   Hyperkalemia   Hyperlipidemia   AF (paroxysmal atrial fibrillation) (HCC)   Acute hypoxemic respiratory failure (HCC)   Prolonged QT interval   Hemoptysis    Acute hypoxemic respiratory failure suspected secondary to fluid overload diastolic congestive heart failure exacerbation: On admission patient was noted to have O2 saturations in the mid to upper 80s temporarily requiring placement on 2 L nasal cannula oxygen.  Chest x-ray significant for cardiomegaly without overt edema.  On physical exam patient with noticeable JVD.  Last echocardiogram from 11/28/2020 revealed EF of 55 to 60% with grade 2 diastolic dysfunction.  Reports recently being 3 to 5 pounds over his dry weight. -wean O2 to off  Chest pain elevated troponin: Acute on chronic.  Patient reports some complaints of chest pressure/stiffness.  High-sensitivity troponin 1582-> 1673, but during previous hospitalization ranged from 44-745.  Evaluated by cardiology and thought secondary to demand ischemia at that time.  Suspect  symptoms could be similar during this admission as patient appears to be fluid overloaded. -LHC on 4/22  Hemoptysis anemia of chronic disease: Patient reports having 3 episodes of coughing bright red blood.  Hemoglobin 9.3 g/dL which appears improved from previous of 8.3 on 4/13.  Patient just recently had EGD and colonoscopy on 4/7/8 which did not reveal any clear signs of bleeding.  -trend h/h  Hyperkalemia: Acute.  Potassium elevated at 5.6.  EKG without significant changes -To be addressed with hemodialysis  Bradycardia: Heart rates in the emergency department in 50s to 60s initially during last hospitalization patient had been seen to have symptomatic bradycardia for which metoprolol and amlodipine have been discontinued  -Follow-up telemetry  Hypertensive urgency: Acute.  On admission blood pressures elevated up to 183/66.  During last admission patient's medications of metoprolol, amlodipine, hydralazine, and losartan had all been discontinued. -Hydralazine 50 mg 3 times daily  Diabetes mellitus type 1 with hypoglycemia: Acute.  Patient noted to have blood glucose as low as 65 on admission.  Glucose improved after patient was given orange juice.  He is on insulin pump, but had reportedly been having issues at home with low blood sugars per the patient's wife. -DM coordinator consult -low dose lantus, SSI  Prolonged QT interval: Acute.  On admission QTC elevated at 542. -Avoid QT prolonging medications -Correcting electrolyte abnormalities  ESRD on HD: Patient normally dialyzes Monday, Wednesday, Friday.  He reports that he was 3 to 5 pounds over his dry weight when he went to hemodialysis on 4/18 and they removed 2 pounds  of fluid. -Continue calcium acetate -Nephrology consulted- s/p HD 4/20 and 4/21  Paroxysmal atrial fibrillation on chronic anticoagulation:   CHA2DS2-VASc score = 4. Reports compliance with Eliquis that was restarted at discharge however it was noted that  patient was only taking Eliquis once daily. -Continue Eliquis 5 mg twice daily unless hemoglobin drops   CAD s/p CABG  Hyperlipidemia -Continue atorvastatin and Zetia  GERD -On Protonix  Per patient: since starting ranolozine at last hospital visit (12/25/20), his hands and feet are shaking/tremors, getting worse- -will hold this med for now Iron studies look normal from last hospitalization-- will reorder for AM  PT Eval ordered for the AM   Family Communication/Anticipated D/C date and plan/Code Status   DVT prophylaxis: eliquis Code Status: Full Code.   Disposition Plan: Status is: Inpatient  Remains inpatient appropriate because:Inpatient level of care appropriate due to severity of illness   Dispo: The patient is from: Home              Anticipated d/c is to: Home              Patient currently is not medically stable to d/c.   Difficult to place patient No         Medical Consultants:    Renal  cards  Subjective:   Family reports weakness since d/c and involuntary movements  Objective:    Vitals:   01/02/21 2113 01/02/21 2251 01/03/21 0352 01/03/21 0750  BP: (!) 156/60 (!) 151/51 (!) 154/66 (!) 161/58  Pulse:  64 68 69  Resp:  20 17   Temp:  98 F (36.7 C) 98.3 F (36.8 C) 98 F (36.7 C)  TempSrc:  Oral Oral Oral  SpO2:  96% 98%   Weight:      Height:        Intake/Output Summary (Last 24 hours) at 01/03/2021 0957 Last data filed at 01/03/2021 0854 Gross per 24 hour  Intake 366 ml  Output 3000 ml  Net -2634 ml   Filed Weights   01/01/21 1745 01/02/21 1100 01/02/21 1417  Weight: 60.4 kg 62 kg 59 kg    Exam:  General: Appearance:    Well developed, well nourished male in no acute distress     Lungs:     Clear to auscultation bilaterally, respirations unlabored  Heart:    Normal heart rate.   MS:   All extremities are intact.   Neurologic:   Awake, alert, oriented x 3. Few episodes of hand movement on left as well as b/l foot  movement     Data Reviewed:   I have personally reviewed following labs and imaging studies:  Labs: Labs show the following:   Basic Metabolic Panel: Recent Labs  Lab 01/01/21 0045 01/02/21 0047 01/03/21 0448  NA 131* 133* 136  K 5.6* 4.6 4.5  CL 88* 91* 95*  CO2 '28 31 30  '$ GLUCOSE 203* 120* 125*  BUN 54* 23 37*  CREATININE 8.87* 6.02* 5.92*  CALCIUM 7.9* 8.5* 8.7*   GFR Estimated Creatinine Clearance: 10.7 mL/min (A) (by C-G formula based on SCr of 5.92 mg/dL (H)). Liver Function Tests: No results for input(s): AST, ALT, ALKPHOS, BILITOT, PROT, ALBUMIN in the last 168 hours. No results for input(s): LIPASE, AMYLASE in the last 168 hours. No results for input(s): AMMONIA in the last 168 hours. Coagulation profile No results for input(s): INR, PROTIME in the last 168 hours.  CBC: Recent Labs  Lab 01/01/21 0045 01/01/21 1004 01/02/21  0047 01/03/21 0448  WBC 6.9 7.5 11.3* 6.6  NEUTROABS 5.3  --   --   --   HGB 9.3* 9.8* 9.6* 10.5*  HCT 28.9* 30.4* 29.5* 32.3*  MCV 100.0 101.3* 99.7 99.7  PLT 122* 130* 152 178   Cardiac Enzymes: No results for input(s): CKTOTAL, CKMB, CKMBINDEX, TROPONINI in the last 168 hours. BNP (last 3 results) No results for input(s): PROBNP in the last 8760 hours. CBG: Recent Labs  Lab 01/02/21 1612 01/02/21 1943 01/02/21 2253 01/03/21 0351 01/03/21 0744  GLUCAP 251* 105* 253* 105* 134*   D-Dimer: No results for input(s): DDIMER in the last 72 hours. Hgb A1c: No results for input(s): HGBA1C in the last 72 hours. Lipid Profile: No results for input(s): CHOL, HDL, LDLCALC, TRIG, CHOLHDL, LDLDIRECT in the last 72 hours. Thyroid function studies: No results for input(s): TSH, T4TOTAL, T3FREE, THYROIDAB in the last 72 hours.  Invalid input(s): FREET3 Anemia work up: No results for input(s): VITAMINB12, FOLATE, FERRITIN, TIBC, IRON, RETICCTPCT in the last 72 hours. Sepsis Labs: Recent Labs  Lab 01/01/21 0045 01/01/21 1004  01/02/21 0047 01/03/21 0448  WBC 6.9 7.5 11.3* 6.6    Microbiology Recent Results (from the past 240 hour(s))  MRSA PCR Screening     Status: Abnormal   Collection Time: 01/01/21  6:26 AM   Specimen: Nasal Mucosa; Nasopharyngeal  Result Value Ref Range Status   MRSA by PCR POSITIVE (A) NEGATIVE Final    Comment:        The GeneXpert MRSA Assay (FDA approved for NASAL specimens only), is one component of a comprehensive MRSA colonization surveillance program. It is not intended to diagnose MRSA infection nor to guide or monitor treatment for MRSA infections. RESULT CALLED TO, READ BACK BY AND VERIFIED WITH: RN HYESUK LEE [id number] AT 816 AM BY CM Performed at [facility], [place] [address], [place], [place] [address]   Resp Panel by RT-PCR (Flu A&B, Covid)     Status: None   Collection Time: 01/01/21 12:26 PM  Result Value Ref Range Status   SARS Coronavirus 2 by RT PCR NEGATIVE NEGATIVE Final    Comment: (NOTE) SARS-CoV-2 target nucleic acids are NOT DETECTED.  The SARS-CoV-2 RNA is generally detectable in upper respiratory specimens during the acute phase of infection. The lowest concentration of SARS-CoV-2 viral copies this assay can detect is 138 copies/mL. A negative result does not preclude SARS-Cov-2 infection and should not be used as the sole basis for treatment or other patient management decisions. A negative result may occur with  improper specimen collection/handling, submission of specimen other than nasopharyngeal swab, presence of viral mutation(s) within the areas targeted by this assay, and inadequate number of viral copies(<138 copies/mL). A negative result must be combined with clinical observations, patient history, and epidemiological information. The expected result is Negative.  Fact Sheet for Patients:  EntrepreneurPulse.com.au  Fact Sheet for Healthcare Providers:  IncredibleEmployment.be  This test is  no t yet approved or cleared by the [place] FDA and  has been authorized for detection and/or diagnosis of SARS-CoV-2 by FDA under an Emergency Use Authorization (EUA). This EUA will remain  in effect (meaning this test can be used) for the duration of the COVID-19 declaration under Section 564(b)(1) of the Act, 21 U.S.C.section 360bbb-3(b)(1), unless the authorization is terminated  or revoked sooner.       Influenza A by PCR NEGATIVE NEGATIVE Final   Influenza B by PCR NEGATIVE NEGATIVE Final  Comment: (NOTE) The Xpert Xpress SARS-CoV-2/FLU/RSV plus assay is intended as an aid in the diagnosis of influenza from Nasopharyngeal swab specimens and should not be used as a sole basis for treatment. Nasal washings and aspirates are unacceptable for Xpert Xpress SARS-CoV-2/FLU/RSV testing.  Fact Sheet for Patients: EntrepreneurPulse.com.au  Fact Sheet for Healthcare Providers: IncredibleEmployment.be  This test is not yet approved or cleared by the [place] FDA and has been authorized for detection and/or diagnosis of SARS-CoV-2 by FDA under an Emergency Use Authorization (EUA). This EUA will remain in effect (meaning this test can be used) for the duration of the COVID-19 declaration under Section 564(b)(1) of the Act, 21 U.S.C. section 360bbb-3(b)(1), unless the authorization is terminated or revoked.  Performed at [facility], [place] [address] [place], [place] [address]     Procedures and diagnostic studies:  No results found.  Medications:   . atorvastatin  80 mg Oral QAC lunch  . calcium acetate  667 mg Oral TID WC  . Chlorhexidine Gluconate Cloth  6 each Topical Q0600  . Chlorhexidine Gluconate Cloth  6 each Topical Q0600  . cinacalcet  60 mg Oral Q M,W,F-HD  . citalopram  20 mg Oral Q lunch  . doxercalciferol  2 mcg Intravenous Q M,W,F-HD  . ezetimibe  10 mg Oral Daily  . gabapentin  100 mg Oral Q lunch  .  hydrALAZINE  50 mg Oral Q8H  . insulin aspart  0-9 Units Subcutaneous Q4H  . insulin glargine  5 Units Subcutaneous Daily  . isosorbide mononitrate  60 mg Oral Daily  . mupirocin ointment   Nasal BID  . sodium chloride flush  3 mL Intravenous Q12H  . sodium chloride flush  3 mL Intravenous Q12H  . sodium chloride flush  3 mL Intravenous Q12H  . vitamin B-12  500 mcg Oral Daily   Continuous Infusions: . sodium chloride    . sodium chloride 10 mL/hr at 01/03/21 0605  . sodium chloride       LOS: 2 days   Geradine Girt  [facility]   How to contact the [facility] Attending or Consulting provider Ward or covering provider during after hours [place], for this patient?  1. Check the care team in [facility] and look for a) attending/consulting TRH provider listed and b) the [facility] team listed 2. Log into [email or web address] and use [place] universal password to access. If you do not have the password, please contact the hospital operator. 3. Locate the [facility] provider you are looking for under [facility] and page to a number that you can be directly reached. 4. If you still have difficulty reaching the provider, please page the [facility] (Director on Call) for the Hospitalists listed on amion for assistance.  01/03/2021, 9:57 AM

## 2021-01-03 NOTE — Progress Notes (Addendum)
Advanced Heart Failure Rounding Note  PCP-Cardiologist: No primary care provider on file.   Subjective:    Feels better today. No chest pain.    Objective:   Weight Range: 59 kg Body mass index is 20.37 kg/m.   Vital Signs:   Temp:  [98 F (36.7 C)-99.1 F (37.3 C)] 98 F (36.7 C) (04/22 0750) Pulse Rate:  [60-72] 69 (04/22 0750) Resp:  [13-22] 17 (04/22 0352) BP: (86-161)/(50-78) 161/58 (04/22 0750) SpO2:  [92 %-99 %] 98 % (04/22 0352) Weight:  [59 kg-62 kg] 59 kg (04/21 1417) Last BM Date: 01/01/21  Weight change: Filed Weights   01/01/21 1745 01/02/21 1100 01/02/21 1417  Weight: 60.4 kg 62 kg 59 kg    Intake/Output:   Intake/Output Summary (Last 24 hours) at 01/03/2021 0813 Last data filed at 01/03/2021 [id number] Gross per 24 hour  Intake 363 ml  Output 3000 ml  Net -2637 ml      Physical Exam   General:  No resp difficulty HEENT: normal Neck: supple. no JVD. Carotids 2+ bilat; no bruits. No lymphadenopathy or thryomegaly appreciated. Cor: PMI nondisplaced. Regular rate & rhythm. No rubs, gallops or murmurs. Lungs: clear Abdomen: soft, nontender, nondistended. No hepatosplenomegaly. No bruits or masses. Good bowel sounds. Extremities: no cyanosis, clubbing, rash, edema. LUE AVF  Neuro: alert & orientedx3, cranial nerves grossly intact. moves all 4 extremities w/o difficulty. Affect pleasant   Telemetry   SR 70s personally reviewed.   EKG    N/A   Labs    CBC Recent Labs    01/01/21 0045 01/01/21 1004 01/02/21 0047 01/03/21 0448  WBC 6.9   < > 11.3* 6.6  NEUTROABS 5.3  --   --   --   HGB 9.3*   < > 9.6* 10.5*  HCT 28.9*   < > 29.5* 32.3*  MCV 100.0   < > 99.7 99.7  PLT 122*   < > 152 178   < > = values in this interval not displayed.   Basic Metabolic Panel Recent Labs    01/02/21 0047 01/03/21 0448  NA 133* 136  K 4.6 4.5  CL 91* 95*  CO2 31 30  GLUCOSE 120* 125*  BUN 23 37*  CREATININE 6.02* 5.92*  CALCIUM 8.5* 8.7*    Liver Function Tests No results for input(s): AST, ALT, ALKPHOS, BILITOT, PROT, ALBUMIN in the last 72 hours. No results for input(s): LIPASE, AMYLASE in the last 72 hours. Cardiac Enzymes No results for input(s): CKTOTAL, CKMB, CKMBINDEX, TROPONINI in the last 72 hours.  BNP: BNP (last 3 results) Recent Labs    12/16/20 0406 01/01/21 1004  BNP 1,991.6* 3,450.4*    ProBNP (last 3 results) No results for input(s): PROBNP in the last 8760 hours.   D-Dimer No results for input(s): DDIMER in the last 72 hours. Hemoglobin A1C No results for input(s): HGBA1C in the last 72 hours. Fasting Lipid Panel No results for input(s): CHOL, HDL, LDLCALC, TRIG, CHOLHDL, LDLDIRECT in the last 72 hours. Thyroid Function Tests No results for input(s): TSH, T4TOTAL, T3FREE, THYROIDAB in the last 72 hours.  Invalid input(s): FREET3  Other results:   Imaging     No results found.   Medications:     Scheduled Medications: . atorvastatin  80 mg Oral QAC lunch  . calcium acetate  667 mg Oral TID WC  . Chlorhexidine Gluconate Cloth  6 each Topical Q0600  . Chlorhexidine Gluconate Cloth  6 each Topical Q0600  .  cinacalcet  60 mg Oral Q M,W,F-HD  . citalopram  20 mg Oral Q lunch  . doxercalciferol  2 mcg Intravenous Q M,W,F-HD  . ezetimibe  10 mg Oral Daily  . gabapentin  100 mg Oral Q lunch  . hydrALAZINE  50 mg Oral Q8H  . insulin aspart  0-9 Units Subcutaneous Q4H  . insulin glargine  5 Units Subcutaneous Daily  . isosorbide mononitrate  60 mg Oral Daily  . mupirocin ointment   Nasal BID  . sodium chloride flush  3 mL Intravenous Q12H  . sodium chloride flush  3 mL Intravenous Q12H  . sodium chloride flush  3 mL Intravenous Q12H     Infusions: . sodium chloride    . sodium chloride 10 mL/hr at 01/03/21 0605  . sodium chloride       PRN Medications:  sodium chloride, sodium chloride, acetaminophen **OR** acetaminophen, albuterol, hydrALAZINE, sodium chloride flush,  sodium chloride flush    Assessment/Plan  1. CAD: s/p CABG. NSTEMI 10/18, cath with 95% ostial SVG-ramus stenosis with TIMI 2 flow in graft, s/p DES to SVG-ramus 07/07/17. NSTEMI 7/19 with in-stent restenosis in the SVG-ramus, had DES x 2 to native ramus. He has had chronic atypical chest pain episodes, Cardiolite in 3/20 showed no ischemia. He had repeat cath in 8/20 showing occluded SVG-ramus (new) but continued patency of the native ramus s/p stenting. No target for intervention. He presented with nausea/vomiting, dyspnea (no chest pain) and was noted to have HS-TnI 1582 => 1673.  Possible NSTEMI, also could be volume overload with demand ischemia in setting of ESRD.  Difficult to differentiate, he is not markedly volume overloaded on exam today but had HD yesterday.  No overt bleeding now and hgb is higher. No chest pain today.  - Will plan repeat LHC/coronary angiography today.  -  Hold Eliquis until after cath.  - No ASA due to Eliquis for Afib  - Continue Imdur 60 mg daily.  - Continue atorvastatin 80 mg daily.  2. Hyperlipidemia: LDL goal < 70 mg/dL, recent lipid panel 2/22 w/ LDL at 52. HFTs WNL - Continue atorvastatin 80 + and Zetia 10 mg daily .  3. Chronic diastolic CHF: Ischemic cardiomyopathy. EF low in past but improved after CABG. Most recent echo 3/22 with EF 55-60%, moderate LVH.  Volume managed by HD.  Not markedly volume overloaded on exam today but had HD yesterday. ?If symptoms were due to volume overload and needs more fluid off with HD.   - Will do RHC/LHC today.   4. Atrial fibrillation: Paroxysmal. He has been in NSR.  - Hold Eliquis for cath.  5. HTN: Controlled on current regimen. No change.  6. ESRD: HD MWF.  7. Carotid stenosis:  followed yearly. Last study 5/21 showed 1-39% bilateral ICA stenosis + rt sided subclavian stenosis  8. Bilateral LE pain: Completed recent w/u to exclude PAD. LE arterial dopplers 2/22 normal. Rt ABI 1.04, Lt ABI 1.12.     RHC/LHC today.   Length of Stay: 2  Darrick Grinder, NP  01/03/2021, 8:13 AM  Advanced Heart Failure Team Pager [phone] (M-F; 7a - 5p)  Please contact [facility] for night-coverage after hours (5p -7a ) and weekends on [email or web address]  Patient seen with NP, agree with the above note.   Cath done today, see below:  Coronary Findings   Diagnostic Dominance: Right  Left Main  Mid LM to Dist LM lesion is 30% stenosed. The lesion is moderately calcified.  Left  Anterior Descending  Prox LAD lesion is 95% stenosed.  First Diagonal Branch  Ost 1st Diag to 1st Diag lesion is 90% stenosed.  Left Circumflex  Prox Cx to Mid Cx lesion is 85% stenosed.  First Obtuse Marginal Branch  Vessel is large in size.  1st Mrg lesion is 50% stenosed. The lesion was previously treated.  Right Coronary Artery  Prox RCA to Mid RCA lesion is 100% stenosed.  Right Posterior Atrioventricular Artery  Collaterals  RPAV filled by collaterals from 1st Mrg.    Second Right Posterolateral Branch  Collaterals  2nd RPL filled by collaterals from 3rd Sept.    Saphenous Graft To Dist RCA  SVG.  Origin to Prox Graft lesion is 100% stenosed.  Graft To 1st Mrg  Origin to Prox Graft lesion is 100% stenosed. The lesion was previously treated using a drug eluting stent between 1-2 years ago.  LIMA LIMA Graft To Mid LAD  LIMA and is normal in caliber.  Insertion lesion is 50% stenosed.   Intervention   No interventions have been documented.  Right Heart  Right Heart Pressures RHC Procedural Findings: Hemodynamics (mmHg) RA mean 1 RV 27/1 PA 26/7, mean 14 PCWP mean 3 LV 158/7 AO 157/51  Oxygen saturations: PA 79% AO 100%  Cardiac Output (Fick) 7.47  Cardiac Index (Fick) 4.43   Coronary Diagrams   Diagnostic Dominance: Right    Intervention   1. Low filling pressures.  2. Coronary and graft anatomy unchanged from prior.  LIMA-LAD remains patent, large high OM1 remains patent s/p prior  PCI, and both SVGs as well as the RCA are occluded.   I suspect his symptoms were most likely due to CHF from inadequate dialysis.  He had HD 2 days in a row with significant fluid removed, now feels good and filling pressures are low.  Elevated troponin was likely due to demand ischemia in the setting of significant volume overload and ESRD.  I think he can go home at this point.  Restart apixaban tomorrow morning. Would recommend lowering dry weight for HD.   Loralie Champagne 01/03/2021 1:58 PM

## 2021-01-03 NOTE — Progress Notes (Signed)
[facility] Progress Note  Subjective: no c/o today, son acting as interpreter  Vitals:   01/02/21 2251 01/03/21 0352 01/03/21 0750 01/03/21 1131  BP: (!) 151/51 (!) 154/66 (!) 161/58 (!) 141/59  Pulse: 64 68 69 64  Resp: '20 17 16   '$ Temp: 98 F (36.7 C) 98.3 F (36.8 C) 98 F (36.7 C) 98.2 F (36.8 C)  TempSrc: Oral Oral Oral Oral  SpO2: 96% 98%    Weight:      Height:        Exam:  alert, nad   no jvd  Chest cta bilat  Cor reg no RG  Abd soft ntnd no ascites   Ext no LE edema   Alert, NF, ox3   LUE AVF+bruit     Home meds:  - phoslo 1 ac tid/ protonix 40 qd  - ranexa 500 bid/ zeita 10 qd/ imdur 60 qd/ lipitor 80 qd  - eliquis 5 bid  - insulin novolog per insulin pump  - neurontin 100 qd/ celexa 20 qd  - prn's/ vitamins/ supplements   CXR - FINDINGS: Mild cardiomegaly with calcific aortic atherosclerosis. Mild bilateral parahilar interstitial opacity without overt pulmonary edema.    OP HD: MWF AF  3h 48mn  400/500 59.5kg   2/2 bath  LUE AVF 15g  Hep none   - mircera 150 q2, next due 5/2   - hect 2 ug tiw  - sensipar '60mg'$  tiw   Assessment/ Plan: 1. SOB/ hypoxia - suspect vol overload and early pulm edema. 4 L then 3 L UF w/ HD x 2, down to dry wt now.   2. ESRD- on HD MWF. [facility]  3. Hypertension/volume- BP's good, at dry 4. Anemiackd- continue outpatient ESA/Fe regimen 5. MBD ckd - Continue VDRA/Sensipar, binder 6. CAD - hxCABG /STEMI/stents 2018 /ISCM  7. Atrial fibrillation- on metoprolol, Eliquis 8. DM- per primary       Rob Cyniah Gossard 01/03/2021, 12:21 PM   Recent Labs  Lab 01/02/21 0047 01/03/21 0448  K 4.6 4.5  BUN 23 37*  CREATININE 6.02* 5.92*  CALCIUM 8.5* 8.7*  HGB 9.6* 10.5*   Inpatient medications: . atorvastatin  80 mg Oral QAC lunch  . calcium acetate  667 mg Oral TID WC  . Chlorhexidine Gluconate Cloth  6 each Topical Q0600  . Chlorhexidine Gluconate  Cloth  6 each Topical Q0600  . cinacalcet  60 mg Oral Q M,W,F-HD  . citalopram  20 mg Oral Q lunch  . doxercalciferol  2 mcg Intravenous Q M,W,F-HD  . ezetimibe  10 mg Oral Daily  . gabapentin  100 mg Oral Q lunch  . hydrALAZINE  50 mg Oral Q8H  . insulin aspart  0-9 Units Subcutaneous Q4H  . insulin glargine  5 Units Subcutaneous Daily  . isosorbide mononitrate  60 mg Oral Daily  . mupirocin ointment   Nasal BID  . sodium chloride flush  3 mL Intravenous Q12H  . sodium chloride flush  3 mL Intravenous Q12H  . sodium chloride flush  3 mL Intravenous Q12H  . vitamin B-12  500 mcg Oral Daily   . sodium chloride    . sodium chloride 10 mL/hr at 01/03/21 0605  . sodium chloride     sodium chloride, sodium chloride, acetaminophen **OR** acetaminophen, albuterol, hydrALAZINE, sodium chloride flush, sodium chloride flush

## 2021-01-03 NOTE — Interval H&P Note (Signed)
History and Physical Interval Note:  01/03/2021 1:06 PM  Kenneth [name]  has presented today for surgery, with the diagnosis of NSTEMI.  The various methods of treatment have been discussed with the patient and family. After consideration of risks, benefits and other options for treatment, the patient has consented to  Procedure(s): RIGHT/LEFT HEART CATH AND CORONARY ANGIOGRAPHY (N/A) as a surgical intervention.  [name] history has been reviewed, patient examined, no change in status, stable for surgery.  I have reviewed the patient's chart and labs.  Questions were answered to the patient's satisfaction.     Emagene Merfeld [employer]

## 2021-01-03 NOTE — Plan of Care (Signed)
  Problem: Education: Goal: Ability to demonstrate management of disease process will improve Outcome: Progressing Goal: Ability to verbalize understanding of medication therapies will improve Outcome: Progressing Goal: Individualized Educational Video(s) Outcome: Progressing   Problem: Activity: Goal: Capacity to carry out activities will improve Outcome: Progressing   Problem: Cardiac: Goal: Ability to achieve and maintain adequate cardiopulmonary perfusion will improve Outcome: Progressing   Problem: Education: Goal: Knowledge of General Education information will improve Description: Including pain rating scale, medication(s)/side effects and non-pharmacologic comfort measures Outcome: Progressing   Problem: Health Behavior/Discharge Planning: Goal: Ability to manage health-related needs will improve Outcome: Progressing   Problem: Clinical Measurements: Goal: Ability to maintain clinical measurements within normal limits will improve Outcome: Progressing Goal: Will remain free from infection Outcome: Progressing Goal: Diagnostic test results will improve Outcome: Progressing Goal: Respiratory complications will improve Outcome: Progressing Goal: Cardiovascular complication will be avoided Outcome: Progressing   Problem: Nutrition: Goal: Adequate nutrition will be maintained Outcome: Progressing   Problem: Pain Managment: Goal: General experience of comfort will improve Outcome: Progressing   Problem: Safety: Goal: Ability to remain free from injury will improve Outcome: Progressing

## 2021-01-04 DIAGNOSIS — E118 Type 2 diabetes mellitus with unspecified complications: Secondary | ICD-10-CM

## 2021-01-04 LAB — IRON AND TIBC
Iron: 48 ug/dL (ref 45–182)
Saturation Ratios: 23 % (ref 17.9–39.5)
TIBC: 207 ug/dL — ABNORMAL LOW (ref 250–450)
UIBC: 159 ug/dL

## 2021-01-04 LAB — GLUCOSE, CAPILLARY
Glucose-Capillary: 100 mg/dL — ABNORMAL HIGH (ref 70–99)
Glucose-Capillary: 144 mg/dL — ABNORMAL HIGH (ref 70–99)
Glucose-Capillary: 204 mg/dL — ABNORMAL HIGH (ref 70–99)

## 2021-01-04 LAB — CBC
HCT: 30.2 % — ABNORMAL LOW (ref 39.0–52.0)
Hemoglobin: 9.9 g/dL — ABNORMAL LOW (ref 13.0–17.0)
MCH: 32.4 pg (ref 26.0–34.0)
MCHC: 32.8 g/dL (ref 30.0–36.0)
MCV: 98.7 fL (ref 80.0–100.0)
Platelets: 208 10*3/uL (ref 150–400)
RBC: 3.06 MIL/uL — ABNORMAL LOW (ref 4.22–5.81)
RDW: 14.7 % (ref 11.5–15.5)
WBC: 6.7 10*3/uL (ref 4.0–10.5)
nRBC: 0 % (ref 0.0–0.2)

## 2021-01-04 MED ORDER — CINACALCET HCL 30 MG PO TABS
60.0000 mg | ORAL_TABLET | Freq: Once | ORAL | Status: AC
  Administered 2021-01-04: 60 mg via ORAL
  Filled 2021-01-04: qty 2

## 2021-01-04 MED ORDER — CYANOCOBALAMIN 500 MCG PO TABS: 500.0000 ug | ORAL_TABLET | Freq: Every day | ORAL | 0 refills | Status: DC

## 2021-01-04 MED ORDER — HYDRALAZINE HCL 50 MG PO TABS: 50.0000 mg | ORAL_TABLET | Freq: Three times a day (TID) | ORAL | 0 refills | Status: DC

## 2021-01-04 MED ORDER — GABAPENTIN 100 MG PO CAPS: 100.0000 mg | ORAL_CAPSULE | Freq: Every day | ORAL | Status: DC

## 2021-01-04 MED ORDER — DOXERCALCIFEROL 4 MCG/2ML IV SOLN
2.0000 ug | Freq: Once | INTRAVENOUS | Status: DC
  Filled 2021-01-04: qty 2

## 2021-01-04 MED ORDER — GABAPENTIN 100 MG PO CAPS
100.0000 mg | ORAL_CAPSULE | Freq: Every day | ORAL | Status: DC
  Filled 2021-01-04: qty 1

## 2021-01-04 MED ORDER — APIXABAN 2.5 MG PO TABS: 2.5000 mg | ORAL_TABLET | Freq: Two times a day (BID) | ORAL | 0 refills | Status: DC

## 2021-01-04 MED ORDER — DOXERCALCIFEROL 2.5 MCG PO CAPS
3.0000 ug | ORAL_CAPSULE | Freq: Once | ORAL | Status: AC
  Administered 2021-01-04: 3 ug via ORAL
  Filled 2021-01-04: qty 1

## 2021-01-04 NOTE — Progress Notes (Signed)
    L/RHC: 01/03/21   Mid LM to Dist LM lesion is 30% stenosed.  Prox LAD lesion is 95% stenosed.  Ost 1st Diag to 1st Diag lesion is 90% stenosed.  Prox Cx to Mid Cx lesion is 85% stenosed.  Prox RCA to Mid RCA lesion is 100% stenosed.  SVG.  Origin to Prox Graft lesion is 100% stenosed.  Origin to Prox Graft lesion is 100% stenosed.  LIMA and is normal in caliber.  Insertion lesion is 50% stenosed.  Balloon angioplasty was performed.  1st Mrg lesion is 50% stenosed.   1. Low filling pressures.  2. Coronary and graft anatomy unchanged from prior.  LIMA-LAD remains patent, large high OM1 remains patent s/p prior PCI, and both SVGs as well as the RCA are occluded.   I suspect his symptoms were most likely due to CHF from inadequate dialysis.  He had HD 2 days in a row with significant fluid removed, now feels good and filling pressures are low.    -- volume management per HD, completed session this morning  Primary team at the bedside. Patient for discharge today. Will send message for follow up in the AHF clinic.    SignedReino Bellis, NP-C 01/04/2021, 12:12 PM Pager: [phone]

## 2021-01-04 NOTE — Progress Notes (Signed)
Subjective:  Seen on hd , no co , no sob  or cp, wt pre hd not accurate( 63.6= 4kg gain overnight ) edw 59 ,noted Card cath results "unchanged from Last"  needs standing wt post hd , RN and pt told Objective Vital signs in last 24 hours: Vitals:   01/04/21 0338 01/04/21 0609 01/04/21 0717 01/04/21 0722  BP: (!) 175/65 (!) 167/71 (!) 158/99 (!) 168/63  Pulse: 71  72 72  Resp: 16  19   Temp: 97.8 F (36.6 C)     TempSrc: Oral     SpO2: 96%     Weight:   63.6 kg   Height:       Weight change:   Physical Exam: General: Alert , NAD  Heart: RRR 2/6 sem, no rub.gal  Lungs: CTA  Abdomen: BS +,soft , nt, nd Extremities:no pedal edema   Dialysis Access: patent on hd LUA AVF    Home meds: - phoslo 1 ac tid/ protonix 40 qd - ranexa 500 bid/ zeita 10 qd/ imdur 60 qd/ lipitor 80 qd - eliquis 5 bid - insulin novolog per insulin pump - neurontin 100 qd/ celexa 20 qd - prn's/ vitamins/ supplements   OP HD:MWF AF 3h 81mn 400/500 59.5kg2/2 bath LUEAVF15g Hep none - mircera 150 q2, next due 5/2 - hect 2 ug tiw - sensipar '60mg'$  tiw    Problem/Plan: 1.  SOB/ hypoxia -  vol overload and early pulm edema. 4 L UF on admit 4/20 w/o improved SOB. 3l uf 4/21  Extra hd  and cath yest  So on hd today stand wt psot HD neededl.  2. ESRD-on HD MWF. HD today 2/2 cath yets off schedule. Hypertension/volume- lower volume with HD 3. Anemiackd- hgb 9.9 yest   continue outpatient ESA/Fe regimen 4. MBD ckd- Continue VDRA/Sensipar, binder ca /phos stable  5. CAD - hxCABG /STEMI/stents 2018 /ISCM , cath noted yest +"unchange from prior"  6. Atrial fibrillation- on metoprolol,Eliquis 7. DM- per primary  DErnest Haber PA-C [facility] [phone]/23/2022,8:17 AM  LOS: 3 days   Labs: Basic Metabolic Panel: Recent Labs  Lab 01/01/21 0045 01/02/21 0047 01/03/21 0448 01/03/21 1323  NA 131* 133* 136 134*  134*  K 5.6* 4.6 4.5 4.4  4.4  CL  88* 91* 95*  --   CO2 '28 31 30  '$ --   GLUCOSE 203* 120* 125*  --   BUN 54* 23 37*  --   CREATININE 8.87* 6.02* 5.92*  --   CALCIUM 7.9* 8.5* 8.7*  --    Liver Function Tests: No results for input(s): AST, ALT, ALKPHOS, BILITOT, PROT, ALBUMIN in the last 168 hours. No results for input(s): LIPASE, AMYLASE in the last 168 hours. No results for input(s): AMMONIA in the last 168 hours. CBC: Recent Labs  Lab 01/01/21 0045 01/01/21 1004 01/02/21 0047 01/03/21 0448 01/03/21 1323 01/04/21 0054  WBC 6.9 7.5 11.3* 6.6  --  6.7  NEUTROABS 5.3  --   --   --   --   --   HGB 9.3* 9.8* 9.6* 10.5* 9.9*  9.9* 9.9*  HCT 28.9* 30.4* 29.5* 32.3* 29.0*  29.0* 30.2*  MCV 100.0 101.3* 99.7 99.7  --  98.7  PLT 122* 130* 152 178  --  208   Cardiac Enzymes: No results for input(s): CKTOTAL, CKMB, CKMBINDEX, TROPONINI in the last 168 hours. CBG: Recent Labs  Lab 01/03/21 1126 01/03/21 1615 01/03/21 1947 01/03/21 2346 01/04/21 0337  GLUCAP  107* 114* 301* 77 100*    Studies/Results: CARDIAC CATHETERIZATION  Result Date: 01/03/2021  Mid LM to Dist LM lesion is 30% stenosed.  Prox LAD lesion is 95% stenosed.  Ost 1st Diag to 1st Diag lesion is 90% stenosed.  Prox Cx to Mid Cx lesion is 85% stenosed.  Prox RCA to Mid RCA lesion is 100% stenosed.  SVG.  Origin to Prox Graft lesion is 100% stenosed.  Origin to Prox Graft lesion is 100% stenosed.  LIMA and is normal in caliber.  Insertion lesion is 50% stenosed.  Balloon angioplasty was performed.  1st Mrg lesion is 50% stenosed.  1. Low filling pressures. 2. Coronary and graft anatomy unchanged from prior.  LIMA-LAD remains patent, large high OM1 remains patent s/p prior PCI, and both SVGs as well as the RCA are occluded. I suspect his symptoms were most likely due to CHF from inadequate dialysis.  He had HD 2 days in a row with significant fluid removed, now feels good and filling pressures are low.   Medications: . sodium chloride     .  apixaban  2.5 mg Oral BID  . atorvastatin  80 mg Oral QAC lunch  . calcium acetate  667 mg Oral TID WC  . Chlorhexidine Gluconate Cloth  6 each Topical Q0600  . Chlorhexidine Gluconate Cloth  6 each Topical Q0600  . cinacalcet  60 mg Oral Q M,W,F-HD  . citalopram  20 mg Oral Q lunch  . doxercalciferol  2 mcg Intravenous Q M,W,F-HD  . ezetimibe  10 mg Oral Daily  . gabapentin  100 mg Oral Q lunch  . hydrALAZINE  50 mg Oral Q8H  . insulin aspart  0-9 Units Subcutaneous Q4H  . insulin glargine  5 Units Subcutaneous Daily  . isosorbide mononitrate  60 mg Oral Daily  . mupirocin ointment   Nasal BID  . sodium chloride flush  3 mL Intravenous Q12H  . sodium chloride flush  3 mL Intravenous Q12H  . sodium chloride flush  3 mL Intravenous Q12H  . vitamin B-12  500 mcg Oral Daily

## 2021-01-04 NOTE — Progress Notes (Signed)
Removed PIV access & patient, wife & son received discharge instructions. They understood it well. Patient will resume the insulin pump and educated for checking CBG & document it. Patient's son took his all belongings. HS [employer]

## 2021-01-04 NOTE — Discharge Summary (Signed)
Physician Discharge Summary  Kenneth [name] [id number] DOB: 27-Jan-1956 DOA: 01/01/2021  PCP: [name], MD  Admit date: 01/01/2021 Discharge date: 01/04/2021  Admitted From: home Discharge disposition: home   Recommendations for Outpatient Follow-Up:   1. Close follow up with endo for medication adjustments 2. Home health 3. patient to keep log of blood sugar and blood pressure  4. Consider referral to neurology    Discharge Diagnosis:   Active Problems:   ESRD (end stage renal disease) ([place])   CAD (coronary artery disease)   Chest pain   Hypertensive urgency   Anemia, chronic renal failure   Fluid overload   Hyperkalemia   Hyperlipidemia   AF (paroxysmal atrial fibrillation) ([facility])   Acute hypoxemic respiratory failure ([facility])   Prolonged QT interval   Hemoptysis    Discharge Condition: Improved.  Diet recommendation: Low sodium, heart healthy.  Carbohydrate-modified Wound care: None.  Code status: Full.   History of Present Illness:   Kenneth [name] is a 65 y.o. [place] speaking male with medical history significant of HTN, HLD, PAF on Eliquis, ESRD on HD(M, W, F), CAD sp CABG, diastolic CHF, and DM type 1 presents with complaints coughing up blood and shortness of breath.  History is obtained with use of patient's nurse acting as Optometrist.  Recently hospitalized from 4/4-4/13 with suspected GI bleed with hemoglobin down to 6.7 requiring transfusion with no clear source found on EGD or colonoscopy, symptomatic bradycardia for which beta-blocker was discontinued, and elevated troponin not likely to be due to demand.  Since leaving the hospital patient has felt nauseous and dizzy with standing.  His stools were initially black, but returned to a brown color.  Denies any loss of consciousness or recent falls.  With any significant exertion he has been feeling more short of breath, but yesterday had coughed bright red blood.  Estimates having 10 ml of bright red  blood present when he coughed and noticed that it occurred 3 times.  He declines and hemodialysis on 4/18.  At that time he was reportedly 3 to 5 pounds over his dry weight and notes that they were able to remove approximately 2 pounds after hemodialysis.  Associated symptoms include complaints of chest pressure/stiffness, palpitations, poor appetite, and generalized fatigue.  Denies any vomiting, abdominal pain, or dysuria.  Patient's wife reportedly noted that the patient had been having difficulty with his insulin pump and had intermittently been having low blood sugars at home.   Hospital Course by Problem:   Acute hypoxemic respiratory failuresuspected secondary to fluid overloaddiastolic congestive heart failure exacerbation:On admission patient was noted to have O2 saturations in the mid to upper 80s temporarily requiring placement on 2 L nasal cannula oxygen. Chest x-ray significant for cardiomegaly without overt edema. On physical exam patient with noticeable JVD. Last echocardiogram from 11/28/2020 revealed EF of 55 to 60% with grade 2 diastolic dysfunction. Reports recently being 3 to 5 pounds over his dry weight. -improved  Chest painelevated troponin: Acute on chronic.Patient reports some complaints of chest pressure/stiffness. High-sensitivity troponin 1582->1673, but during previous hospitalization ranged from 44-745. Evaluated by cardiology and thought secondary to demand ischemia at that time.Suspect symptoms could be similar during this admission as patient appears to be fluid overloaded. -s/p LHC on 4/22  Hemoptysisanemia of chronic disease:Patient reports having 3 episodes of coughing bright red blood. Hemoglobin 9.3 g/dLwhich appears improved from previous of 8.3 on 4/13. Patient just recently had EGD and colonoscopy on  4/7/8 which did not reveal any clear signs of bleeding.  -resolved  Hyperkalemia: Acute. Potassium elevated at 5.6. EKG without  significant changes - addressed with hemodialysis  Hypertensive urgency: Acute. On admission blood pressures elevated up to 183/66. During last admission patient's medications of metoprolol, amlodipine, hydralazine, and losartan had all been discontinued. -Hydralazine'50mg'$  3 times daily  Diabetes mellitus type 1with hypoglycemia: Acute. Patient noted to have blood glucose as low as 65 on admission. Glucose improved after patient was given orange juice. He is on insulin pump, but had reportedly been having issues at home with low blood sugars per the patient's wife. -suspect patient has not been taking insulin correctly  Prolonged QT interval: Acute. On admission QTC elevated at 542. -Avoid QT prolonging medications -Correcting electrolyte abnormalities  ESRD on HD: Patient normally dialyzes Monday, Wednesday, Friday. He reports that he was 3 to 5 pounds over his dry weight when he went to hemodialysis on 4/18 and they removed 2 pounds of fluid. -Continue calcium acetate -Nephrology consulted- s/p HD 4/20 and 4/21 and 4/23  Paroxysmal atrial fibrillation on chronic anticoagulation:  CHA2DS2-VASc score =4.Reports compliance with Eliquisthat was restarted atdischarge however it was noted that patient was only taking Eliquis once daily. -Continue Eliquis2.5 mg twice daily   CADs/p CABG  Hyperlipidemia -Continue atorvastatin and Zetia  GERD -On Protonix    Medical Consultants:    Cards renal  Discharge Exam:   Vitals:   01/04/21 1055 01/04/21 1136  BP: (!) 168/63 (!) 132/56  Pulse: 70 68  Resp: 16 15  Temp:  98.1 F (36.7 C)  SpO2: 100% 100%   Vitals:   01/04/21 1000 01/04/21 1030 01/04/21 1055 01/04/21 1136  BP: (!) 125/95 (!) 142/61 (!) 168/63 (!) 132/56  Pulse: (!) 51 (!) 58 70 68  Resp:   16 15  Temp:    98.1 F (36.7 C)  TempSrc:    Oral  SpO2:   100% 100%  Weight:   60.5 kg   Height:        General exam: Appears calm and  comfortable.     The results of significant diagnostics from this hospitalization (including imaging, microbiology, ancillary and laboratory) are listed below for reference.     Procedures and Diagnostic Studies:   DG Chest 2 View  Result Date: 01/01/2021 CLINICAL DATA:  Hemoptysis EXAM: CHEST - 2 VIEW COMPARISON:  12/22/2020 FINDINGS: Mild cardiomegaly with calcific aortic atherosclerosis. Mild bilateral parahilar interstitial opacity without overt pulmonary edema. No pleural effusion or pneumothorax. Left subclavian stent. IMPRESSION: Mild cardiomegaly without overt pulmonary edema Electronically Signed   By: Ulyses Jarred M.D.   On: 01/01/2021 01:23     Labs:   Basic Metabolic Panel: Recent Labs  Lab 01/01/21 0045 01/02/21 0047 01/03/21 0448 01/03/21 1323  NA 131* 133* 136 134*  134*  K 5.6* 4.6 4.5 4.4  4.4  CL 88* 91* 95*  --   CO2 '28 31 30  '$ --   GLUCOSE 203* 120* 125*  --   BUN 54* 23 37*  --   CREATININE 8.87* 6.02* 5.92*  --   CALCIUM 7.9* 8.5* 8.7*  --    GFR Estimated Creatinine Clearance: 10.9 mL/min (A) (by C-G formula based on SCr of 5.92 mg/dL (H)). Liver Function Tests: No results for input(s): AST, ALT, ALKPHOS, BILITOT, PROT, ALBUMIN in the last 168 hours. No results for input(s): LIPASE, AMYLASE in the last 168 hours. No results for input(s): AMMONIA in the last 168 hours.  Coagulation profile No results for input(s): INR, PROTIME in the last 168 hours.  CBC: Recent Labs  Lab 01/01/21 0045 01/01/21 1004 01/02/21 0047 01/03/21 0448 01/03/21 1323 01/04/21 0054  WBC 6.9 7.5 11.3* 6.6  --  6.7  NEUTROABS 5.3  --   --   --   --   --   HGB 9.3* 9.8* 9.6* 10.5* 9.9*  9.9* 9.9*  HCT 28.9* 30.4* 29.5* 32.3* 29.0*  29.0* 30.2*  MCV 100.0 101.3* 99.7 99.7  --  98.7  PLT 122* 130* 152 178  --  208   Cardiac Enzymes: No results for input(s): CKTOTAL, CKMB, CKMBINDEX, TROPONINI in the last 168 hours. BNP: Invalid input(s): POCBNP CBG: Recent Labs   Lab 01/03/21 1947 01/03/21 2346 01/04/21 0337 01/04/21 1011 01/04/21 1135  GLUCAP 301* 77 100* 144* 204*   D-Dimer No results for input(s): DDIMER in the last 72 hours. Hgb A1c No results for input(s): HGBA1C in the last 72 hours. Lipid Profile No results for input(s): CHOL, HDL, LDLCALC, TRIG, CHOLHDL, LDLDIRECT in the last 72 hours. Thyroid function studies No results for input(s): TSH, T4TOTAL, T3FREE, THYROIDAB in the last 72 hours.  Invalid input(s): FREET3 Anemia work up [employer]    01/04/21 0054  TIBC 207*  IRON 48   Microbiology Recent Results (from the past 240 hour(s))  MRSA PCR Screening     Status: Abnormal   Collection Time: 01/01/21  6:26 AM   Specimen: Nasal Mucosa; Nasopharyngeal  Result Value Ref Range Status   MRSA by PCR POSITIVE (A) NEGATIVE Final    Comment:        The GeneXpert MRSA Assay (FDA approved for NASAL specimens only), is one component of a comprehensive MRSA colonization surveillance program. It is not intended to diagnose MRSA infection nor to guide or monitor treatment for MRSA infections. RESULT CALLED TO, READ BACK BY AND VERIFIED WITH: RN HYESUK LEE [id number] AT 816 AM BY CM Performed at [facility], [place] [address], [place], [place] [address]   Resp Panel by RT-PCR (Flu A&B, Covid)     Status: None   Collection Time: 01/01/21 12:26 PM  Result Value Ref Range Status   SARS Coronavirus 2 by RT PCR NEGATIVE NEGATIVE Final    Comment: (NOTE) SARS-CoV-2 target nucleic acids are NOT DETECTED.  The SARS-CoV-2 RNA is generally detectable in upper respiratory specimens during the acute phase of infection. The lowest concentration of SARS-CoV-2 viral copies this assay can detect is 138 copies/mL. A negative result does not preclude SARS-Cov-2 infection and should not be used as the sole basis for treatment or other patient management decisions. A negative result may occur with  improper specimen collection/handling,  submission of specimen other than nasopharyngeal swab, presence of viral mutation(s) within the areas targeted by this assay, and inadequate number of viral copies(<138 copies/mL). A negative result must be combined with clinical observations, patient history, and epidemiological information. The expected result is Negative.  Fact Sheet for Patients:  EntrepreneurPulse.com.au  Fact Sheet for Healthcare Providers:  IncredibleEmployment.be  This test is no t yet approved or cleared by the [place] FDA and  has been authorized for detection and/or diagnosis of SARS-CoV-2 by FDA under an Emergency Use Authorization (EUA). This EUA will remain  in effect (meaning this test can be used) for the duration of the COVID-19 declaration under Section 564(b)(1) of the Act, 21 U.S.C.section 360bbb-3(b)(1), unless the authorization is terminated  or revoked sooner.  Influenza A by PCR NEGATIVE NEGATIVE Final   Influenza B by PCR NEGATIVE NEGATIVE Final    Comment: (NOTE) The Xpert Xpress SARS-CoV-2/FLU/RSV plus assay is intended as an aid in the diagnosis of influenza from Nasopharyngeal swab specimens and should not be used as a sole basis for treatment. Nasal washings and aspirates are unacceptable for Xpert Xpress SARS-CoV-2/FLU/RSV testing.  Fact Sheet for Patients: EntrepreneurPulse.com.au  Fact Sheet for Healthcare Providers: IncredibleEmployment.be  This test is not yet approved or cleared by the [place] FDA and has been authorized for detection and/or diagnosis of SARS-CoV-2 by FDA under an Emergency Use Authorization (EUA). This EUA will remain in effect (meaning this test can be used) for the duration of the COVID-19 declaration under Section 564(b)(1) of the Act, 21 U.S.C. section 360bbb-3(b)(1), unless the authorization is terminated or revoked.  Performed at [facility], [place] [address], [place], [place] [address]      Discharge Instructions:   Discharge Instructions    Diet - low sodium heart healthy   Complete by: As directed    Diet Carb Modified   Complete by: As directed    Discharge instructions   Complete by: As directed    Please keep log of blood sugar and blood pressure and how you are feeling -you have been started on hydralazine for BP and this medication may need to be adjusted  Last note from Dr. Hartford Poli: 1. Uncontrolled Type 1 Diabetes w/neuropathy: Hgb A1C down to 7.3% (was 8.1% in October). Kenneth [name] reports doing well overall. He's doing better with checking his blood sugars with readings entered into his pump 2-4 times per day. However, he eats at least twice a day but only enters carbohydrates for a meal insulin bolus 0-1x/day. His blood sugars are still low in the 50-60s when fasting (Ex: During dialysis). Post-meal blood sugars can be up to mid-200s when he doesn't give himself an insulin bolus dose. I again encouraged Kenneth [name] to enter carbohydrates into his pump for all food intake and to give his meal bolus dose BEFORE he starts eating. All of his pump's basal rates will be lowered by 0.05 units/hour to reduce his risk for fasting hypoglycemia. He is not interested in getting a continuous glucose monitor or upgrading his pump.   Increase activity slowly   Complete by: As directed      Allergies as of 01/04/2021   No Known Allergies     Medication List    STOP taking these medications   ranolazine 500 MG 12 hr tablet Commonly known as: RANEXA     TAKE these medications   acetaminophen 500 MG tablet Commonly known as: TYLENOL Take 1,000 mg by mouth every 6 (six) hours as needed for mild pain or headache.   apixaban 2.5 MG Tabs tablet Commonly known as: ELIQUIS Take 1 tablet (2.5 mg total) by mouth 2 (two) times daily. What changed:   medication strength  how much to take   atorvastatin 80 MG tablet Commonly known as: LIPITOR Take 1  tablet by mouth once daily What changed: when to take this   calcium acetate 667 MG capsule Commonly known as: PHOSLO Take 667 mg by mouth 3 (three) times daily with meals.   citalopram 20 MG tablet Commonly known as: CELEXA Take 20 mg by mouth daily with lunch.   ezetimibe 10 MG tablet Commonly known as: ZETIA Take 1 tablet by mouth once daily   gabapentin 100 MG capsule Commonly known as: NEURONTIN  Take 1 capsule (100 mg total) by mouth at bedtime. What changed: when to take this   hydrALAZINE 50 MG tablet Commonly known as: APRESOLINE Take 1 tablet (50 mg total) by mouth every 8 (eight) hours.   insulin pump Soln Inject into the skin continuous. NOVOLOG   isosorbide mononitrate 60 MG 24 hr tablet Commonly known as: IMDUR Take 1 tablet by mouth once daily   NovoLOG 100 UNIT/ML injection Generic drug: insulin aspart Inject 6-10 Units into the skin See admin instructions. Starts with 10 units per day and adds 6 units with each meal via pump   pantoprazole 40 MG tablet Commonly known as: PROTONIX Take 40 mg by mouth daily before lunch.   vitamin B-12 500 MCG tablet Commonly known as: CYANOCOBALAMIN Take 1 tablet (500 mcg total) by mouth daily. Start taking on: January 05, 2021   vitamin C 1000 MG tablet Take 1,000 mg by mouth daily.       Follow-up Information    [facility] Follow up on 01/28/2021.   Specialty: Cardiology Why: at 9:40 Garage Code [id number] Contact information: [address] [id number] [place]       [name], MD Follow up in 1 week(s).   Specialty: Family Medicine Contact information: [place] [address] [phone]        Larey Dresser, MD .   Specialty: Cardiology Contact information: [phone] [address] [employer] [place] [address] [phone]        [facility] Follow  up.   Why: HHPT, please call Cheryl at [phone] if you have any questions start of care will start on MOnday Contact information: [place] [address] [id number]                Time coordinating discharge: 35 min  Signed:  Geradine Girt DO  [facility] 01/04/2021, 1:02 PM

## 2021-01-04 NOTE — TOC Transition Note (Signed)
Transition of Care [facility]) - CM/SW Discharge Note   Patient Details  Name: Kenneth [name] MRN: [id number] Date of Birth: [birth year]  Transition [facility] Care [facility]) CM/SW Contact:  Zenon Mayo, RN Phone Number: 01/04/2021, 12:50 PM   Clinical Narrative:    Patient from home, for dc today.  Await PT eval.      Barriers to Discharge: No Barriers Identified   Patient Goals and CMS Choice Patient states their goals for this hospitalization and ongoing recovery are:: return home      Discharge Placement                       Discharge Plan and Services   Discharge Planning Services: CM Consult                                 Social Determinants of Health (SDOH) Interventions     Readmission Risk Interventions Readmission Risk Prevention Plan 01/04/2021  Transportation Screening Complete  Medication Review ([place]) Complete  HRI or [place] Complete  SW Recovery Care/Counseling Consult Complete  Palliative Care Screening Not [place] Not Applicable  Some recent data might be hidden

## 2021-01-04 NOTE — Progress Notes (Signed)
PT Cancellation Note  Patient Details Name: ORVIS RUDELL MRN: [id number] DOB: 12/05/1955   Cancelled Treatment:    Reason Eval/Treat Not Completed: Patient at procedure or test/unavailable (HD). Will follow-up this afternoon as schedule permits.  Mabeline Caras, PT, DPT Acute Rehabilitation Services  Pager [phone] Office [place] 01/04/2021, 7:49 AM

## 2021-01-04 NOTE — TOC Transition Note (Signed)
Transition of Care [facility]) - CM/SW Discharge Note   Patient Details  Name: Kenneth [name] MRN: [id number] Date of Birth: 10-12-1955  Transition of Care [facility]) CM/SW Contact:  Zenon Mayo, RN Phone Number: 01/04/2021, 1:00 PM   Clinical Narrative:    Patient is for dc today, will need HHPT, NCM spoke with son who is in the room with patient.  Son states he will need HHPT and he does not have a preference of the agency.  NCM made referral to [facility] with [facility]. She is able to take referral with soc on Monday.  Son will transport patient home today.  Final next level of care: [place] Barriers to Discharge: No Barriers Identified   Patient Goals and CMS Choice Patient states their goals for this hospitalization and ongoing recovery are:: return CMS Medicare.gov Compare Post Acute Care list provided to:: Patient Represenative (must comment) Choice offered to / list presented to : Adult Children  Discharge Placement                       Discharge Plan and Services   Discharge Planning Services: CM Consult              DME Agency: NA       HH Arranged: PT HH Agency: [place] Date HH Agency Contacted: 01/04/21 Time [place]: [id number] Representative spoke with at [place]: [place] Determinants of Health ([place]) Interventions     Readmission Risk Interventions Readmission Risk Prevention Plan 01/04/2021  Transportation Screening Complete  Medication Review Press photographer) Complete  HRI or [place] Complete  SW Recovery Care/Counseling Consult Complete  Palliative Care Screening Not [place] Not Applicable  Some recent data might be hidden

## 2021-01-04 NOTE — Evaluation (Signed)
Physical Therapy Evaluation & Discharge Patient Details Name: Kenneth [name] MRN: [id number] DOB: 24-Feb-1956 Today's Date: 01/04/2021   History of Present Illness  Pt is a 65 y.o. male admitted 01/01/21 with nausea/vomiting, SOB and coughing up blood. Workup for acute hypoxic respiratory failure secondary to fluid overload, CHF exacerbation, hypertensive urgency. S/p cardiac cath via R groin on 4/22. PMH includes DM1, HTN, ESRD (HD MWF), PAF on Eliquis, CAD s/p CABG. Of note, recent hospitalization 12/16/20-12/25/20 with suspected GIB, bradycardia.    Clinical Impression  Patient evaluated by Physical Therapy with no further acute PT needs identified. PTA, pt independent, lives with wife, enjoys walking dog daily. Today, pt independent with mobility; asymptomatic with activity. Pt's family present and supportive. All education has been completed and the patient has no further questions. Acute PT is signing off. Thank you for this referral.  Orthostatic BPs Supine 138/52  Sitting 122/54  Standing 119/55  Post-ambulation 137/55      Follow Up Recommendations No PT follow up    Equipment Recommendations  None recommended by PT    Recommendations for Other Services       Precautions / Restrictions Precautions Precautions: Fall Restrictions Weight Bearing Restrictions: No      Mobility  Bed Mobility Overal bed mobility: Independent                  Transfers Overall transfer level: Independent Equipment used: None                Ambulation/Gait Ambulation/Gait assistance: Independent Gait Distance (Feet): 200 Feet Assistive device: None Gait Pattern/deviations: WFL(Within Functional Limits)   Gait velocity interpretation: 1.31 - 2.62 ft/sec, indicative of limited community Conservation officer, historic buildings [place] (Stroke Patients Only)       Balance Overall balance assessment: No apparent balance deficits (not formally  assessed)                                           Pertinent Vitals/Pain Pain Assessment: No/denies pain    Home Living Family/patient expects to be discharged to:: Private residence Living Arrangements: Spouse/significant other Available Help at Discharge: Family;Available 24 hours/day Type of Home: House Home Access: Stairs to enter Entrance Stairs-Rails: Right Entrance Stairs-Number of Steps: 3 Home Layout: One level Home Equipment: Shower seat;Grab bars - tub/shower;Walker - 2 wheels      Prior Function Level of Independence: Independent         Comments: Independent with ambulation and ADLs. Walks dog 2x/day     Hand Dominance        Extremity/Trunk Assessment   Upper Extremity Assessment Upper Extremity Assessment: Overall WFL for tasks assessed    Lower Extremity Assessment Lower Extremity Assessment: Overall WFL for tasks assessed    Cervical / Trunk Assessment Cervical / Trunk Assessment: Normal  Communication   Communication: Prefers language other than English  Cognition Arousal/Alertness: Awake/alert Behavior During Therapy: WFL for tasks assessed/performed Overall Cognitive Status: Within Functional Limits for tasks assessed                                 General Comments: WFL via [name]. Pt also speaking in some basic [place]  Comments General comments (skin integrity, edema, etc.): Pt's wife and son present, supportive. Negative for orthostatic hypotension, pt asymptomatic with mobility    Exercises     Assessment/Plan    PT Assessment Patent does not need any further PT services  PT Problem List         PT Treatment Interventions      PT Goals (Current goals can be found in the Care Plan section)  Acute Rehab PT Goals PT Goal Formulation: All assessment and education complete, DC therapy    Frequency     Barriers to discharge        Co-evaluation                AM-PAC PT "6 Clicks" Mobility  Outcome Measure Help needed turning from your back to your side while in a flat bed without using bedrails?: None Help needed moving from lying on your back to sitting on the side of a flat bed without using bedrails?: None Help needed moving to and from a bed to a chair (including a wheelchair)?: None Help needed standing up from a chair using your arms (e.g., wheelchair or bedside chair)?: None Help needed to walk in hospital room?: None Help needed climbing 3-5 steps with a railing? : None 6 Click Score: 24    End of Session Equipment Utilized During Treatment: Gait belt Activity Tolerance: Patient tolerated treatment well Patient left: in bed;with call bell/phone within reach;with family/visitor present Nurse Communication: Mobility status PT Visit Diagnosis: Other abnormalities of gait and mobility (R26.89)    Time: [id number] PT Time Calculation (min) (ACUTE ONLY): 11 min   Charges:   PT Evaluation $PT Eval Moderate Complexity: [place], PT, DPT Acute Rehabilitation Services  Pager [phone] Office [place] 01/04/2021, 1:38 PM

## 2021-01-05 ENCOUNTER — Other Ambulatory Visit (HOSPITAL_COMMUNITY): Payer: Self-pay | Admitting: Cardiology

## 2021-01-28 ENCOUNTER — Other Ambulatory Visit: Payer: Self-pay

## 2021-01-28 ENCOUNTER — Encounter (HOSPITAL_COMMUNITY): Payer: Self-pay | Admitting: Cardiology

## 2021-01-28 ENCOUNTER — Ambulatory Visit (HOSPITAL_COMMUNITY)
Admission: RE | Admit: 2021-01-28 | Discharge: 2021-01-28 | Disposition: A | Discharge status: Home / Self Care | Payer: Medicare Other | Source: Ambulatory Visit | Attending: Cardiology | Admitting: Cardiology

## 2021-01-28 VITALS — BP 160/70 | HR 67 | Wt 138.2 lb

## 2021-01-28 DIAGNOSIS — N186 End stage renal disease: Secondary | ICD-10-CM | POA: Diagnosis not present

## 2021-01-28 DIAGNOSIS — I252 Old myocardial infarction: Secondary | ICD-10-CM | POA: Insufficient documentation

## 2021-01-28 DIAGNOSIS — Z7901 Long term (current) use of anticoagulants: Secondary | ICD-10-CM | POA: Diagnosis not present

## 2021-01-28 DIAGNOSIS — Z955 Presence of coronary angioplasty implant and graft: Secondary | ICD-10-CM | POA: Diagnosis not present

## 2021-01-28 DIAGNOSIS — I2510 Atherosclerotic heart disease of native coronary artery without angina pectoris: Secondary | ICD-10-CM | POA: Insufficient documentation

## 2021-01-28 DIAGNOSIS — Z992 Dependence on renal dialysis: Secondary | ICD-10-CM | POA: Diagnosis not present

## 2021-01-28 DIAGNOSIS — Z79899 Other long term (current) drug therapy: Secondary | ICD-10-CM | POA: Diagnosis not present

## 2021-01-28 DIAGNOSIS — I5042 Chronic combined systolic (congestive) and diastolic (congestive) heart failure: Secondary | ICD-10-CM | POA: Insufficient documentation

## 2021-01-28 DIAGNOSIS — E785 Hyperlipidemia, unspecified: Secondary | ICD-10-CM | POA: Insufficient documentation

## 2021-01-28 DIAGNOSIS — Z794 Long term (current) use of insulin: Secondary | ICD-10-CM | POA: Diagnosis not present

## 2021-01-28 DIAGNOSIS — I480 Paroxysmal atrial fibrillation: Secondary | ICD-10-CM | POA: Diagnosis not present

## 2021-01-28 DIAGNOSIS — I132 Hypertensive heart and chronic kidney disease with heart failure and with stage 5 chronic kidney disease, or end stage renal disease: Secondary | ICD-10-CM | POA: Diagnosis not present

## 2021-01-28 DIAGNOSIS — E1122 Type 2 diabetes mellitus with diabetic chronic kidney disease: Secondary | ICD-10-CM | POA: Insufficient documentation

## 2021-01-28 DIAGNOSIS — I5032 Chronic diastolic (congestive) heart failure: Secondary | ICD-10-CM

## 2021-01-28 MED ORDER — ISOSORBIDE MONONITRATE ER 60 MG PO TB24: 90.0000 mg | ORAL_TABLET | Freq: Every day | ORAL | 5 refills | Status: DC

## 2021-01-28 MED ORDER — HYDRALAZINE HCL 100 MG PO TABS: 100.0000 mg | ORAL_TABLET | Freq: Three times a day (TID) | ORAL | 6 refills | Status: DC

## 2021-01-28 MED ORDER — APIXABAN 5 MG PO TABS: 5.0000 mg | ORAL_TABLET | Freq: Two times a day (BID) | ORAL | 6 refills | Status: DC

## 2021-01-28 NOTE — Progress Notes (Signed)
ReDS Vest / Clip - 01/28/21 1100      ReDS Vest / Clip   Station Marker C    Ruler Value 27.5    ReDS Value Range High volume overload    ReDS Actual Value 41

## 2021-01-28 NOTE — Patient Instructions (Signed)
INCREASE Hydralazine '100mg'$  (1 tab) three times a day  INCREASE Imdur to '90mg'$  (1.5 tabs) daily  INCREASE Eliquis to '5mg'$  (1 tab) twice a day  Your physician recommends that you schedule a follow-up appointment in: 3 weeks with the Pharmacist for blood pressure medication adjustment  Your physician recommends that you schedule a follow-up appointment in: 2 months with the Physician Assistant or Nurse Practice  Please call office at [phone] option 2 if you have any questions or concerns.   At the [facility], you and your health needs are our priority. As part of our continuing mission to provide you with exceptional heart care, we have created designated Provider Care Teams. These Care Teams include your primary Cardiologist (physician) and Advanced Practice Providers (APPs- Physician Assistants and Nurse Practitioners) who all work together to provide you with the care you need, when you need it.   You may see any of the following providers on your designated Care Team at your next follow up: Marland Kitchen Dr Glori Bickers . Dr Loralie Champagne . Dr Vickki Muff . Darrick Grinder, NP . Lyda Jester, [place] . Audry Riles, PharmD   Please be sure to bring in all your medications bottles to every appointment.

## 2021-01-28 NOTE — Progress Notes (Signed)
PCP: Dr. Tasia Catchings [facility]) Cardiology: Dr. Aundra Dubin  65 y.o. 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  He was admitted in 9/17 with NSTEMI.  LHC showed 3VD and he had CABG x 3.  He had CKD stage IV when he was admitted and developed ESRD requiring HD while in the hospital.  He had transient atrial fibrillation post-CABG.  He developed cardiogenic shock requiring milrinone but was able to taper off.  Echo in 9/17 showed EF 35-40%.    Admitted 8/1 through 04/16/2017 with malignant hypertension due to medication noncompliance. Continues on HD.  HF M-W-F. Discharge weight 140 pounds.   Admitted 10/18 with chest pain and pulmonary edema. NSTEMI noted on admit. Echo showed preserved EF but + WMAs. Cath with 95% ostial SVG-OM1 stenosis with TIMI 2 flow in graft.  Patient had DES to SVG-OM1.   Admitted in 12/18 with atypical chest pain, suspect not cardiac.   NSTEMI in 7/19 with severe in-stent restenosis in SVG-OM1, had DES x 2 to native OM1.  Echo showed EF 50%.  Atrial fibrillation was noted this admission.   He has had a chronic pattern of atypical chest pain.  Cardiolite in 3/20 showed EF 54%, small fixed inferior defect with no ischemia.   He was admitted in 8/20 with severe episode of chest pain.  Hs-TnI was mildly elevated without trend.  Cath was done, showing no target for PCI and stable disease. Echo was done showing EF 50-55% with moderate LVH and normal RV.    He was admitted in 4/22 with CHF and chest pain, elevated HS-TnI to 1673.  Volume overloaded treated with daily HD in hospital.  He had RHC/LHC done after he had been dialyzed x 2, showing normal filling pressures and unchanged coronary anatomy.  Echo showed EF 55-60%, RV normal.   He presents today for followup of CHF and CAD.  He is still short of breath walking a short distance.  No chest pain.  Occasional orthopnea.  SBP has been  running high at home and is high today.  He says that he has been taking all his meds regularly.    REDS clip 41%.   ECG (personally reviewed): NSR, LAFB, LVH with repolarization  Labs (12/18): LDL 64, HDL 31 Labs (7/19): LDL 36  Labs (2/20): hgb 11.3, LDL 59, HDL 28 Labs (2/22): LDL 52, HDL 37 Labs (4/22): hgb 9.9  PMH: 1. Type II diabetes 2. HTN 3. ESRD 4. CAD: NSTEMI 9/17 with CABG x 3 (LIMA-LAD, SVG-OM, SVG-RCA).   - NSTEMI 10/18 s/p DES to ostial SVG-OM - NSTEMI 7/19: severe in-stent restenosis in SVG-ramus, chronic total occlusion of SVG-RCA.  DES x 2 to native ramus.  - Cardiolite (3/20): EF 54%, small fixed inferior defect with no ischemia.  - LHC (8/20): 90% pLAD, subtotal occlusion mLAD, LIMA-LAD patent, occluded SVG-high OM1, 40-50% in-stent restenosis in native OM1, 80% ostial small AV LCx (not target for intervention), occluded RCA, occluded SVG-RCA (chronic with left=>right collaterals).  No target for intervention.  - LHC (4/22): 90% pLAD, subtotal occlusion mLAD, LIMA-LAD patent, occluded SVG-high OM1, 50% in-stent restenosis in native OM1, 85% ostial small AV LCx (not target for intervention), occluded RCA, occluded SVG-RCA (chronic with left=>right collaterals).  No target for intervention.  5. Chronic systolic CHF: Ischemic cardiomyopathy.   - Echo (9/17) with EF 35-40%, grade II diastolic dysfunction.  - Echo (1/18) with EF  60-65%, grade II diastolic dysfunction. - Echo (10/18) with EF 55%, Akinesis of the basal-midinferolateral, inferior, and inferoseptal myocardiam. Akinesis of the basal anteroseptal myocardium.  - Echo (7/19): EF 50%, moderate LVH, mildly decreased RV systolic function.  - Echo (8/20): EF 50-55%, moderate LVH, normal RV - Echo (4/22): EF 55-60%, normal RV 6. Atrial fibrillation: Paroxysmal.  Only noted post-op CABG.  7. ABIs normal in 12/17. 5/18 ABIs normal.  1/19 ABIs normal. 4/20 ABIs normal.  8. Carotid stenosis: Carotid dopplers (12/18) with  < 50% right common carotid stenosis, [id number] LICA stenosis.  - Carotid dopplers (1/20): < 50% BICA stenosis.  - Carotid dopplers (5/21): Mild bilateral disease.  9. Atrial fibrillation: Paroxysmal.  10. 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 Sciatica  SH: Married, lives in [place], 1 son, nonsmoker, no ETOH, speaks only [place].   FH: No premature CAD.  Review of systems complete and found to be negative unless listed in HPI.    Current Outpatient Medications  Medication Sig Dispense Refill  . acetaminophen (TYLENOL) 500 MG tablet Take 1,000 mg by mouth every 6 (six) hours as needed for mild pain or headache.     . Ascorbic Acid (VITAMIN C) 1000 MG tablet Take 1,000 mg by mouth daily.    Marland Kitchen atorvastatin (LIPITOR) 80 MG tablet Take 1 tablet by mouth once daily 90 tablet 0  . calcium acetate (PHOSLO) 667 MG capsule Take 667 mg by mouth 3 (three) times daily with meals.     . citalopram (CELEXA) 20 MG tablet Take 20 mg by mouth daily with lunch.   1  . ezetimibe (ZETIA) 10 MG tablet Take 1 tablet by mouth once daily 90 tablet 3  . gabapentin (NEURONTIN) 100 MG capsule Take 1 capsule (100 mg total) by mouth at bedtime.    . Insulin Human (INSULIN PUMP) SOLN Inject into the skin continuous. NOVOLOG    . NOVOLOG 100 UNIT/ML injection Inject 6-10 Units into the skin See admin instructions. Starts with 10 units per day and adds 6 units with each meal via pump    . pantoprazole (PROTONIX) 40 MG tablet Take 40 mg by mouth daily before lunch.   0  . vitamin B-12 (CYANOCOBALAMIN) 500 MCG tablet Take 1 tablet (500 mcg total) by mouth daily. 30 tablet 0  . apixaban (ELIQUIS) 5 MG TABS tablet Take 1 tablet (5 mg total) by mouth 2 (two) times daily. 60 tablet 6  . hydrALAZINE (APRESOLINE) 100 MG tablet Take 1 tablet (100 mg total) by mouth every 8 (eight) hours. 90 tablet 6  . isosorbide mononitrate (IMDUR) 60 MG 24 hr tablet Take 1.5 tablets (90 mg total) by mouth daily. 45  tablet 5   No current facility-administered medications for this encounter.   Vitals:   01/28/21 0948  BP: (!) 160/70  Pulse: 67  SpO2: 96%  Weight: 62.7 kg (138 lb 3.2 oz)   Wt Readings from Last 3 Encounters:  01/28/21 62.7 kg (138 lb 3.2 oz)  01/04/21 60.5 kg (133 lb 6.1 oz)  12/25/20 66.6 kg (146 lb 13.2 oz)   General: NAD Neck: JVP 8-9 cm, no thyromegaly or thyroid nodule.  Lungs: Clear to auscultation bilaterally with normal respiratory effort. CV: Nondisplaced PMI.  Heart regular S1/S2, no S3/S4, 2/6 SEM RUSB.  No peripheral edema.  No carotid bruit.  Normal pedal pulses.  Abdomen: Soft, nontender, no hepatosplenomegaly, no distention.  Skin: Intact without lesions or rashes.  Neurologic: Alert  and oriented x 3.  Psych: Normal affect. Extremities: No clubbing or cyanosis.  HEENT: Normal.   Assessment/Plan: 1. CAD: s/p CABG.  NSTEMI 10/18, cath with 95% ostial SVG-high OM1 stenosis with TIMI 2 flow in graft, s/p DES to SVG-OM1 07/07/17. NSTEMI 7/19 with in-stent restenosis in the SVG-ramus, had DES x 2 to native high OM1.   He has had chronic atypical chest pain episodes, Cardiolite in 3/20 showed no ischemia.  He had repeat cath in 8/20 showing occluded SVG-ramus (new) but continued patency of the native high OM1 s/p stenting.  No target for intervention.  LHC again in 4/22, no change from 8/20.  No intervention.  Chest pain/elevated troponin thought to be due to volume overload/demand ischemia.   - He will continue Eliquis so not on ASA.  - Continue Imdur 60 mg daily.  - Continue atorvastatin 80 mg daily.  2. Hyperlipidemia: Continue atorvastatin and Zetia. Good lipids in 2/22.   3. Chronic diastolic CHF: EF low in past but improved after CABG. Most recent echo 4/22 with EF 55-60%.  He is volume overloaded today by exam and by REDS clip (41%).  He was admitted in 4/22 with CHF.  NYHA class III symptoms.  I think that he needs his dry weight lowered at HD.   - On dialysis for  volume control => ask renal to lower his dry weight.  4. Atrial fibrillation: Paroxysmal, NSR recently.  - Continue Eliquis but increase to proper dose which is 5 mg bid.  5. HTN: BP has been elevated, he denies any problems with hypotension with dialysis.  - Increase hydralazine to 100 mg tid.  - Increase Imdur to 90 mg daily.   6. ESRD: MWF. As above, needs dry weight lowered based on NYHA class III symptoms and volume overload.      Followup in 3 wks with HF pharmacist and reassess blood pressure and adjust meds.  See APP in 2 months.   Loralie Champagne 01/28/2021

## 2021-01-31 ENCOUNTER — Encounter: Payer: Self-pay | Admitting: Gastroenterology

## 2021-02-13 ENCOUNTER — Ambulatory Visit (HOSPITAL_COMMUNITY)
Admission: RE | Admit: 2021-02-13 | Discharge: 2021-02-13 | Disposition: A | Discharge status: Home / Self Care | Payer: Medicare Other | Source: Ambulatory Visit | Attending: Internal Medicine | Admitting: Internal Medicine

## 2021-02-13 ENCOUNTER — Other Ambulatory Visit: Payer: Self-pay

## 2021-02-13 ENCOUNTER — Encounter (HOSPITAL_COMMUNITY): Payer: Self-pay

## 2021-02-13 VITALS — BP 152/55 | HR 60 | Wt 136.0 lb

## 2021-02-13 DIAGNOSIS — Z955 Presence of coronary angioplasty implant and graft: Secondary | ICD-10-CM | POA: Diagnosis not present

## 2021-02-13 DIAGNOSIS — I5022 Chronic systolic (congestive) heart failure: Secondary | ICD-10-CM | POA: Diagnosis present

## 2021-02-13 DIAGNOSIS — Z951 Presence of aortocoronary bypass graft: Secondary | ICD-10-CM | POA: Diagnosis not present

## 2021-02-13 DIAGNOSIS — E785 Hyperlipidemia, unspecified: Secondary | ICD-10-CM | POA: Diagnosis not present

## 2021-02-13 DIAGNOSIS — I2510 Atherosclerotic heart disease of native coronary artery without angina pectoris: Secondary | ICD-10-CM | POA: Diagnosis not present

## 2021-02-13 DIAGNOSIS — I130 Hypertensive heart and chronic kidney disease with heart failure and stage 1 through stage 4 chronic kidney disease, or unspecified chronic kidney disease: Secondary | ICD-10-CM | POA: Diagnosis not present

## 2021-02-13 DIAGNOSIS — I110 Hypertensive heart disease with heart failure: Secondary | ICD-10-CM | POA: Insufficient documentation

## 2021-02-13 DIAGNOSIS — N184 Chronic kidney disease, stage 4 (severe): Secondary | ICD-10-CM | POA: Insufficient documentation

## 2021-02-13 DIAGNOSIS — Z7901 Long term (current) use of anticoagulants: Secondary | ICD-10-CM | POA: Diagnosis not present

## 2021-02-13 DIAGNOSIS — Z992 Dependence on renal dialysis: Secondary | ICD-10-CM | POA: Diagnosis not present

## 2021-02-13 DIAGNOSIS — E1122 Type 2 diabetes mellitus with diabetic chronic kidney disease: Secondary | ICD-10-CM | POA: Insufficient documentation

## 2021-02-13 DIAGNOSIS — I252 Old myocardial infarction: Secondary | ICD-10-CM | POA: Diagnosis not present

## 2021-02-13 DIAGNOSIS — I5032 Chronic diastolic (congestive) heart failure: Secondary | ICD-10-CM

## 2021-02-13 DIAGNOSIS — I480 Paroxysmal atrial fibrillation: Secondary | ICD-10-CM | POA: Insufficient documentation

## 2021-02-13 MED ORDER — LOSARTAN POTASSIUM 25 MG PO TABS: 25.0000 mg | ORAL_TABLET | Freq: Every day | ORAL | 5 refills | Status: DC

## 2021-02-13 NOTE — Progress Notes (Signed)
PCP: Dr. Tasia Catchings [facility]) Cardiology: Dr. Aundra Dubin  HPI:   65 y.o. 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  He was admitted in 9/17 with NSTEMI.  LHC showed 3VD and he had CABG x 3.  He had CKD stage IV when he was admitted and developed ESRD requiring HD while in the hospital.  He had transient atrial fibrillation post-CABG.  He developed cardiogenic shock requiring milrinone but was able to taper off.  Echo in 9/17 showed EF 35-40%.    Admitted 8/1 through 04/16/2017 with malignant hypertension due to medication noncompliance. Continues on HD.  HF M-W-F. Discharge weight 140 pounds.   Admitted 10/18 with chest pain and pulmonary edema. NSTEMI noted on admit. Echo showed preserved EF but + WMAs. Cath with 95% ostial SVG-OM1 stenosis with TIMI 2 flow in graft. Patient had DES to SVG-OM1.   Admitted in 12/18 with atypical chest pain, suspect not cardiac.   NSTEMI in 7/19 with severe in-stent restenosis in SVG-OM1, had DES x 2 to native OM1.  Echo showed EF 50%.  Atrial fibrillation was noted this admission.   He has had a chronic pattern of atypical chest pain. Cardiolite in 3/20 showed EF 54%, small fixed inferior defect with no ischemia.   He was admitted in 8/20 with severe episode of chest pain.  Hs-TnI was mildly elevated without trend.  Cath was done, showing no target for PCI and stable disease. Echo was done showing EF 50-55% with moderate LVH and normal RV.   He was admitted in 4/22 with CHF and chest pain, elevated HS-TnI to 1673.  Volume overloaded treated with daily HD in hospital.  He had RHC/LHC done after he had been dialyzed x 2, showing normal filling pressures and unchanged coronary anatomy.  Echo showed EF 55-60%, RV normal.   He had f/u appointment with Dr. Aundra Dubin on 01/28/21. At this visit, he was still short of breath walking a short distance.  No chest pain.  Occasional orthopnea.  SBP had been running high at home and was high during visit (BP 160/70 mmHg, P 67). Medication adherence reported. REDS clip 41%.  Today he returns to HF clinic for pharmacist medication titration. At last visit with MD, hydralazine was increased to 100 mg TID and Imdur was increased to 90 mg once daily. Patient presented with wife and [facility] Interpreter. Overall, patient feeling well. Reporting headaches and dizziness daily in the morning, and improves once taking morning medications. SOB/pressure in chest a couple times per week. Wife had brought home BP log - morning BP prior to medications range from 177-230/60-80s. Receives HD on MWF. REDS clip reading today: 33%  HF Medications: Hydralazine 100 mg TID Imdur 90 mg daily  Has the patient been experiencing any side effects to the medications prescribed?  no  Does the patient have any problems obtaining medications due to transportation or finances?   no  Understanding of regimen: good Understanding of indications: good Potential of compliance: excellent Patient understands to avoid NSAIDs. Patient understands to avoid decongestants.    Pertinent Lab Values: (as of 01/03/21) . Serum creatinine 5.92 on HD, BUN 37, Potassium 4.5, Sodium 136, BNP 3450  Vital Signs: . Weight: 136 lbs (last clinic weight: 137.9 lbs) . Blood pressure: 152/55  . Heart rate: 60   Assessment/Plan: 1. Chronic systolic CHF (EF 0000000). EF low in past but improved after CABG. Volume  status improved since last visit, REDS clip (33%).  He was admitted in 4/22 with CHF.  NYHA class II symptoms. BP remains elevated today.  - Restart losartan at lower dose of 25 mg daily, instructed to take at bedtime to help prevent elevated AM BP - Continue hydralazine 100 mg TID and Imdur 90 mg once daily - Continue to hold metoprolol XL, stopped with bradycardia - On dialysis for volume control, dry weight recently reduced per last visit  2. CAD: s/p  CABG.  NSTEMI 10/18, cath with 95% ostial SVG-high OM1 stenosis with TIMI 2 flow in graft, s/p DES to SVG-OM1 07/07/17. NSTEMI 7/19 with in-stent restenosis in the SVG-ramus, had DES x 2 to native high OM1.   He has had chronic atypical chest pain episodes, Cardiolite in 3/20 showed no ischemia.  He had repeat cath in 8/20 showing occluded SVG-ramus (new) but continued patency of the native high OM1 s/p stenting.  No target for intervention.  LHC again in 4/22, no change from 8/20.  No intervention.  Chest pain/elevated troponin thought to be due to volume overload/demand ischemia.   - He will continue Eliquis so not on ASA.  - Continue Imdur 90 mg daily.  - Continue atorvastatin 80 mg daily.  3. Hyperlipidemia: Continue atorvastatin and Zetia. Good lipids in 2/22.   4. Atrial fibrillation: Paroxysmal, NSR recently.  - Continue Eliquis 5 mg bid.  5. HTN: BP has been elevated, he denies any problems with hypotension with dialysis.  - restarting losartan today (per above) 6. ESRD: MWF. Dry weight recently reduced due to volume overload status; no volume overload concerns today.   Follow-up visit with PA/NP on April 01, 2021.   Harriet Pho, PharmD PGY-1 [facility] Pharmacy Resident   Kerby Nora, PharmD, BCPS [facility] Pharmacist [phone]

## 2021-02-13 NOTE — Patient Instructions (Addendum)
??   ??  ?? ??? ??????!  ??: - ??? ?? ??? ?? ?????! - ?? ???   losartan '25mg'$  ?? - ?? ?? ??? ??? ??????.   ?? ??: [facility] ??? ???? ?? 2022? 7? 19?? ???? ?????.  ????? ???? ?????: - ??? ?? ??? 2??(0.5??)? ??????. - ?? ???? ?? 2~3g(2000~'3000mg'$ )?? ??????. - ?? ??? ???? ???? ? "?? ??"? ?? ??? ??????. (??? 2-3???? ?? ??? ????? ?? ??? ?????.) - ??? ?? ?? ??? ?? ??? ???? ????. - ?? ?? ???? ?? ???? ??????.  ??? ??? ?? ?? ??? ????? ???? [phone]? ??????.   ----------------------------------------------------------------------------------------  It was a pleasure seeing you today!  MEDICATIONS: -We are changing your medications today! -Start losartan 25 mg at bedtime  -Call if you have questions about your medications.   NEXT APPOINTMENT: Return to clinic on April 01, 2021 for a visit with Dr. Aundra Dubin.  In general, to take care of your heart failure: -Limit your fluid intake to 2 Liters (half-gallon) per day.   -Limit your salt intake to ideally 2-3 grams (2000-3000 mg) per day. -Weigh yourself daily and record, and bring that "weight diary" to your next appointment.  (Weight gain of 2-3 pounds in 1 day typically means fluid weight.) -The medications for your heart are to help your heart and help you live longer.   -Please contact us before stopping any of your heart medications.  Call the clinic at [phone] with questions or to reschedule future appointments.

## 2021-02-22 ENCOUNTER — Other Ambulatory Visit (HOSPITAL_COMMUNITY): Payer: Self-pay | Admitting: Cardiology

## 2021-02-25 ENCOUNTER — Other Ambulatory Visit: Payer: Self-pay

## 2021-02-25 ENCOUNTER — Ambulatory Visit (INDEPENDENT_AMBULATORY_CARE_PROVIDER_SITE_OTHER): Payer: Medicare Other | Admitting: Physician Assistant

## 2021-02-25 VITALS — BP 185/65 | HR 98 | Temp 98.0°F | Resp 16 | Ht 66.0 in | Wt 135.4 lb

## 2021-02-25 DIAGNOSIS — N186 End stage renal disease: Secondary | ICD-10-CM | POA: Diagnosis not present

## 2021-02-25 DIAGNOSIS — Z992 Dependence on renal dialysis: Secondary | ICD-10-CM | POA: Diagnosis not present

## 2021-02-25 NOTE — Progress Notes (Signed)
Established Dialysis Access   History of Present Illness   Kenneth [name] is a 65 y.o. 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 Patient has had his left brachiocephalic AV fistula since 2017. He has had revision and plication in November of 2020 and again in December of 2020 for erosion over his fistula. He explains that the fistula is working well. He does have pain with cannulation at times. He says he had procedure done at [facility] approximately 4 months ago but do not have records of this. He denies any bleeding issues. He does not have any steal symptoms. He currently dialyzes on MWF.  Current Outpatient Medications  Medication Sig Dispense Refill   acetaminophen (TYLENOL) 500 MG tablet Take 1,000 mg by mouth every 6 (six) hours as needed for mild pain or headache.      apixaban (ELIQUIS) 5 MG TABS tablet Take 1 tablet (5 mg total) by mouth 2 (two) times daily. 60 tablet 6   Ascorbic Acid (VITAMIN C) 1000 MG tablet Take 1,000 mg by mouth daily.     atorvastatin (LIPITOR) 80 MG tablet Take 1 tablet by mouth once daily 90 tablet 0   calcium acetate (PHOSLO) 667 MG capsule Take 667 mg by mouth 3 (three) times daily with meals.      citalopram (CELEXA) 20 MG tablet Take 20 mg by mouth daily with lunch.   1   ezetimibe (ZETIA) 10 MG tablet Take 1 tablet by mouth once daily 90 tablet 0   gabapentin (NEURONTIN) 100 MG capsule Take 1 capsule (100 mg total) by mouth at bedtime.     hydrALAZINE (APRESOLINE) 100 MG tablet Take 1 tablet (100 mg total) by mouth every 8 (eight) hours. 90 tablet 6   Insulin Human (INSULIN PUMP) SOLN Inject into the skin continuous. NOVOLOG     isosorbide mononitrate (IMDUR) 60 MG 24 hr tablet Take 1.5 tablets (90 mg total) by mouth daily. 45 tablet 5   losartan (COZAAR) 25 MG tablet Take 1 tablet (25 mg total) by mouth at bedtime. 30 tablet 5   NOVOLOG 100 UNIT/ML injection Inject  6-10 Units into the skin See admin instructions. Starts with 10 units per day and adds 6 units with each meal via pump     pantoprazole (PROTONIX) 40 MG tablet Take 40 mg by mouth daily before lunch.   0   vitamin B-12 (CYANOCOBALAMIN) 500 MCG tablet Take 1 tablet (500 mcg total) by mouth daily. 30 tablet 0   No current facility-administered medications for this visit.    On ROS today: negative unless stated in HPI   Physical Examination   Vitals:   02/25/21 1436  BP: (!) 185/65  Pulse: 98  Resp: 16  Temp: 98 F (36.7 C)  TempSrc: Temporal  SpO2: 97%  Weight: 135 lb 6.4 oz (61.4 kg)  Height: '5\' 6"'$  (1.676 m)   Body mass index is 21.85 kg/m.  Left upper extremity  Mid portion of fistula with diffuse aneurysms, hypopigmentation with thinning of the overlying skin and small scab present in proximal fistula. Fistula is very pulsatile. Thrill is present. 2+ radial pulse. 5/5 grip strength. Left hand warm and well perfused     Medical Decision Making   Kenneth [name] is a 65 y.o. 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 He has history of 2 prior revisions for the same  issue. He is currently not having any bleeding issues but he does have thinning skin and small scab. Fistula is also very pulsatile on examination. Discussed with patient recommendation for fistulogram to evaluate treat any stenosis in the fistula prior to further revision. We discussed that if he does need subsequent revision he will likely need a TDC while the fistula heals. He would really like to avoid [facility] if possible. We will further discuss this with him after Fistulogram. I will arranged this in the near future. He dialyzes on MWF so will try to schedule this on a non dialysis day  Karoline Caldwell, PA-C [facility] [place] Office: [facility] MD: Stanford Breed Carlis Abbott

## 2021-02-27 ENCOUNTER — Ambulatory Visit (HOSPITAL_COMMUNITY)
Admission: RE | Admit: 2021-02-27 | Discharge: 2021-02-27 | Disposition: A | Discharge status: Home / Self Care | Payer: Medicare Other | Source: Ambulatory Visit | Attending: Internal Medicine | Admitting: Internal Medicine

## 2021-02-27 ENCOUNTER — Other Ambulatory Visit: Payer: Self-pay

## 2021-02-27 ENCOUNTER — Other Ambulatory Visit (HOSPITAL_COMMUNITY): Payer: Self-pay | Admitting: Cardiology

## 2021-02-27 DIAGNOSIS — I6521 Occlusion and stenosis of right carotid artery: Secondary | ICD-10-CM | POA: Diagnosis not present

## 2021-02-27 DIAGNOSIS — I2510 Atherosclerotic heart disease of native coronary artery without angina pectoris: Secondary | ICD-10-CM | POA: Diagnosis present

## 2021-02-27 DIAGNOSIS — I6522 Occlusion and stenosis of left carotid artery: Secondary | ICD-10-CM

## 2021-03-06 ENCOUNTER — Encounter (HOSPITAL_COMMUNITY): Payer: Self-pay | Admitting: Vascular Surgery

## 2021-03-06 ENCOUNTER — Other Ambulatory Visit: Payer: Self-pay

## 2021-03-06 ENCOUNTER — Ambulatory Visit (HOSPITAL_COMMUNITY)
Admission: RE | Disposition: A | Discharge status: Home / Self Care | Payer: Self-pay | Source: Home / Self Care | Attending: Vascular Surgery

## 2021-03-06 ENCOUNTER — Ambulatory Visit (HOSPITAL_COMMUNITY)
Admission: RE | Admit: 2021-03-06 | Discharge: 2021-03-06 | Disposition: A | Discharge status: Home / Self Care | Payer: Medicare Other | Attending: Vascular Surgery | Admitting: Vascular Surgery

## 2021-03-06 DIAGNOSIS — Z992 Dependence on renal dialysis: Secondary | ICD-10-CM | POA: Insufficient documentation

## 2021-03-06 DIAGNOSIS — Z7901 Long term (current) use of anticoagulants: Secondary | ICD-10-CM | POA: Insufficient documentation

## 2021-03-06 DIAGNOSIS — N186 End stage renal disease: Secondary | ICD-10-CM | POA: Diagnosis not present

## 2021-03-06 DIAGNOSIS — Z79899 Other long term (current) drug therapy: Secondary | ICD-10-CM | POA: Insufficient documentation

## 2021-03-06 DIAGNOSIS — T82858A Stenosis of vascular prosthetic devices, implants and grafts, initial encounter: Secondary | ICD-10-CM | POA: Diagnosis present

## 2021-03-06 DIAGNOSIS — Y832 Surgical operation with anastomosis, bypass or graft as the cause of abnormal reaction of the patient, or of later complication, without mention of misadventure at the time of the procedure: Secondary | ICD-10-CM | POA: Diagnosis not present

## 2021-03-06 DIAGNOSIS — Z794 Long term (current) use of insulin: Secondary | ICD-10-CM | POA: Diagnosis not present

## 2021-03-06 DIAGNOSIS — Y841 Kidney dialysis as the cause of abnormal reaction of the patient, or of later complication, without mention of misadventure at the time of the procedure: Secondary | ICD-10-CM | POA: Insufficient documentation

## 2021-03-06 HISTORY — PX: PERIPHERAL VASCULAR INTERVENTION: CATH118257

## 2021-03-06 HISTORY — PX: A/V FISTULAGRAM: CATH118298

## 2021-03-06 LAB — GLUCOSE, CAPILLARY: Glucose-Capillary: 129 mg/dL — ABNORMAL HIGH (ref 70–99)

## 2021-03-06 LAB — POCT I-STAT, CHEM 8
BUN: 46 mg/dL — ABNORMAL HIGH (ref 8–23)
Calcium, Ion: 1 mmol/L — ABNORMAL LOW (ref 1.15–1.40)
Chloride: 91 mmol/L — ABNORMAL LOW (ref 98–111)
Creatinine, Ser: 6.8 mg/dL — ABNORMAL HIGH (ref 0.61–1.24)
Glucose, Bld: 135 mg/dL — ABNORMAL HIGH (ref 70–99)
HCT: 37 % — ABNORMAL LOW (ref 39.0–52.0)
Hemoglobin: 12.6 g/dL — ABNORMAL LOW (ref 13.0–17.0)
Potassium: 5.4 mmol/L — ABNORMAL HIGH (ref 3.5–5.1)
Sodium: 134 mmol/L — ABNORMAL LOW (ref 135–145)
TCO2: 34 mmol/L — ABNORMAL HIGH (ref 22–32)

## 2021-03-06 SURGERY — A/V FISTULAGRAM
Anesthesia: LOCAL | Laterality: Left

## 2021-03-06 MED ORDER — SODIUM CHLORIDE 0.9% FLUSH: 3.0000 mL | Freq: Two times a day (BID) | INTRAVENOUS | Status: DC

## 2021-03-06 MED ORDER — LIDOCAINE HCL (PF) 1 % IJ SOLN
INTRAMUSCULAR | Status: AC
  Filled 2021-03-06: qty 30

## 2021-03-06 MED ORDER — HEPARIN SODIUM (PORCINE) 1000 UNIT/ML IJ SOLN
INTRAMUSCULAR | Status: DC | PRN
  Administered 2021-03-06: 3000 [IU] via INTRAVENOUS

## 2021-03-06 MED ORDER — SODIUM CHLORIDE 0.9% FLUSH: 3.0000 mL | INTRAVENOUS | Status: DC | PRN

## 2021-03-06 MED ORDER — HEPARIN (PORCINE) IN NACL 1000-0.9 UT/500ML-% IV SOLN
INTRAVENOUS | Status: AC
  Filled 2021-03-06: qty 500

## 2021-03-06 MED ORDER — HEPARIN (PORCINE) IN NACL 1000-0.9 UT/500ML-% IV SOLN
INTRAVENOUS | Status: DC | PRN
  Administered 2021-03-06: 500 mL

## 2021-03-06 MED ORDER — SODIUM CHLORIDE 0.9 % IV SOLN: 250.0000 mL | INTRAVENOUS | Status: DC | PRN

## 2021-03-06 MED ORDER — IODIXANOL 320 MG/ML IV SOLN
INTRAVENOUS | Status: DC | PRN
  Administered 2021-03-06: 25 mL

## 2021-03-06 SURGICAL SUPPLY — 14 items
BAG SNAP BAND KOVER 36X36 (MISCELLANEOUS) ×2 IMPLANT
BALLN MUSTANG 7X60X75 (BALLOONS) ×2
BALLOON MUSTANG 7X60X75 (BALLOONS) ×1 IMPLANT
COVER DOME SNAP 22 D (MISCELLANEOUS) ×2 IMPLANT
KIT ENCORE 26 ADVANTAGE (KITS) ×2 IMPLANT
KIT MICROPUNCTURE NIT STIFF (SHEATH) ×2 IMPLANT
PROTECTION STATION PRESSURIZED (MISCELLANEOUS) ×2
SHEATH PINNACLE R/O II 6F 4CM (SHEATH) ×2 IMPLANT
SHEATH PROBE COVER 6X72 (BAG) ×2 IMPLANT
STATION PROTECTION PRESSURIZED (MISCELLANEOUS) ×1 IMPLANT
STOPCOCK MORSE 400PSI 3WAY (MISCELLANEOUS) ×2 IMPLANT
TRAY PV CATH (CUSTOM PROCEDURE TRAY) ×2 IMPLANT
TUBING CIL FLEX 10 FLL-RA (TUBING) ×2 IMPLANT
WIRE BENTSON .035X145CM (WIRE) ×2 IMPLANT

## 2021-03-06 NOTE — Op Note (Signed)
    OPERATIVE NOTE   PROCEDURE: left brachiocephalic arteriovenous fistula cannulation under ultrasound guidance left arm fistulogram including central venogram left peripheral venoplasty cephalic vein (7 mm x 60 mm Mustang)   PRE-OPERATIVE DIAGNOSIS: Malfunctioning left arteriovenous fistula  POST-OPERATIVE DIAGNOSIS: same as above   SURGEON: Marty Heck, MD  ANESTHESIA: local  ESTIMATED BLOOD LOSS: 5 cc  FINDING(S): No evidence of central venous stenosis.  There was a stent in the cephalic arch that had about 30 to 40% stenosis on both the proximal distal aspect of the stent both treated with a 7 mm angioplasty balloon.  Also in the mid upper arm cephalic vein there was about a 40 to 50% stenosis that was also treated with a 7 mm angioplasty balloon.  Excellent thrill at completion.  I will delay any open revision of the fistula at this time given the scab looks much better and appears superficial and the skin is intact.  Will arrange follow-up in 1 month for arm check.  SPECIMEN(S):  None  CONTRAST: 25 mL  INDICATIONS: Kenneth [name] is a 65 y.o. male who  presents with malfunctioning left brachiocephalic arteriovenous fistula.  The patient is scheduled for left arm fistuloogram.  The patient is aware the risks include but are not limited to: bleeding, infection, thrombosis of the cannulated access, and possible anaphylactic reaction to the contrast.  The patient is aware of the risks of the procedure and elects to proceed forward.  DESCRIPTION: After full informed written consent was obtained, the patient was brought back to the angiography suite and placed supine upon the angiography table.  The patient was connected to monitoring equipment.  The left arm was prepped and draped in the standard fashion for a left arm fistulogram.  Under ultrasound guidance, the fistula was evaluated, it was patent, an image was saved.  It was cannulated with a micropuncture needle.  The  microwire was advanced into the fistula and the needle was exchanged for the a microsheath, which was lodged 2 cm into the access.  The wire was removed and the sheath was connected to the IV extension tubing.  Hand injections were completed to image the access from the antecubitum up to the level of axilla.  The central venous structures were also imaged by hand injections.  After evaluating pictures we elected to intervene.  Bentson wire was placed and a short 6 French sheath was placed in the fistula.  Patient was given 3000 units of IV heparin.  I then used a Bentson wire across the stenosis in the cephalic vein as well as the cephalic vein arch.  All the lesions were angioplastied with a 7 mm Mustang to nominal pressure for 2 minutes.  Excellent results.  We did get a retrograde shot with no significant proximal disease.  No residual stenosis and good thrill. A 4-0 Monocryl purse-string suture was sewn around the sheath.  The sheath was removed while tying down the suture.  A sterile bandage was applied to the puncture site.  COMPLICATIONS: None  CONDITION: None  Marty Heck, MD [facility] Office: [place]   03/06/2021 10:38 AM

## 2021-03-06 NOTE — H&P (Signed)
History and Physical Interval Note:  03/06/2021 8:57 AM  Kenneth [name]  has presented today for surgery, with the diagnosis of instage renal.  The various methods of treatment have been discussed with the patient and family. After consideration of risks, benefits and other options for treatment, the patient has consented to  Procedure(s): A/V FISTULAGRAM (Left) as a surgical intervention.  [name] history has been reviewed, patient examined, no change in status, stable for surgery.  I have reviewed the patient's chart and labs.  Questions were answered to the patient's satisfaction.    Left arm fistulogram.  Kenneth [name]  Established Dialysis Access     History of Present Illness    Kenneth [name] is a 65 y.o. 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 Patient has had his left brachiocephalic AV fistula since 2017. He has had revision and plication in November of 2020 and again in December of 2020 for erosion over his fistula. He explains that the fistula is working well. He does have pain with cannulation at times. He says he had procedure done at [facility] approximately 4 months ago but do not have records of this. He denies any bleeding issues. He does not have any steal symptoms. He currently dialyzes on MWF.         Current Outpatient Medications  Medication Sig Dispense Refill   acetaminophen (TYLENOL) 500 MG tablet Take 1,000 mg by mouth every 6 (six) hours as needed for mild pain or headache.       apixaban (ELIQUIS) 5 MG TABS tablet Take 1 tablet (5 mg total) by mouth 2 (two) times daily. 60 tablet 6   Ascorbic Acid (VITAMIN C) 1000 MG tablet Take 1,000 mg by mouth daily.       atorvastatin (LIPITOR) 80 MG tablet Take 1 tablet by mouth once daily 90 tablet 0   calcium acetate (PHOSLO) 667 MG capsule Take 667 mg by mouth 3 (three) times daily with meals.       citalopram (CELEXA) 20 MG  tablet Take 20 mg by mouth daily with lunch.   1   ezetimibe (ZETIA) 10 MG tablet Take 1 tablet by mouth once daily 90 tablet 0   gabapentin (NEURONTIN) 100 MG capsule Take 1 capsule (100 mg total) by mouth at bedtime.       hydrALAZINE (APRESOLINE) 100 MG tablet Take 1 tablet (100 mg total) by mouth every 8 (eight) hours. 90 tablet 6   Insulin Human (INSULIN PUMP) SOLN Inject into the skin continuous. NOVOLOG       isosorbide mononitrate (IMDUR) 60 MG 24 hr tablet Take 1.5 tablets (90 mg total) by mouth daily. 45 tablet 5   losartan (COZAAR) 25 MG tablet Take 1 tablet (25 mg total) by mouth at bedtime. 30 tablet 5   NOVOLOG 100 UNIT/ML injection Inject 6-10 Units into the skin See admin instructions. Starts with 10 units per day and adds 6 units with each meal via pump       pantoprazole (PROTONIX) 40 MG tablet Take 40 mg by mouth daily before lunch.   0   vitamin B-12 (CYANOCOBALAMIN) 500 MCG tablet Take 1 tablet (500 mcg total) by mouth daily. 30 tablet 0    No current facility-administered medications for this visit.      On ROS today: negative unless stated in HPI     Physical Examination  Vitals:    02/25/21 1436  BP: (!) 185/65  Pulse: 98  Resp: 16  Temp: 98 F (36.7 C)  TempSrc: Temporal  SpO2: 97%  Weight: 135 lb 6.4 oz (61.4 kg)  Height: '5\' 6"'$  (1.676 m)    Body mass index is 21.85 kg/m.   Left upper extremity             Mid portion of fistula with diffuse aneurysms, hypopigmentation with thinning of the overlying skin and small scab present in proximal fistula. Fistula is very pulsatile. Thrill is present. 2+ radial pulse. 5/5 grip strength. Left hand warm and well perfused        Medical Decision Making    [name] is a 65 y.o. 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 He has history of 2 prior revisions for the same issue. He is currently not having any bleeding issues but he does have thinning skin and small scab.  Fistula is also very pulsatile on examination. Discussed with patient recommendation for fistulogram to evaluate treat any stenosis in the fistula prior to further revision. We discussed that if he does need subsequent revision he will likely need a TDC while the fistula heals. He would really like to avoid [facility] if possible. We will further discuss this with him after Fistulogram. I will arranged this in the near future. He dialyzes on MWF so will try to schedule this on a non dialysis day   Karoline Caldwell, PA-C [facility] [place] Office: [facility] MD: Stanford Breed Carlis Abbott

## 2021-03-07 MED FILL — Lidocaine HCl Local Preservative Free (PF) Inj 1%: INTRAMUSCULAR | Qty: 30 | Status: AC

## 2021-03-27 ENCOUNTER — Ambulatory Visit (INDEPENDENT_AMBULATORY_CARE_PROVIDER_SITE_OTHER): Payer: Medicare Other

## 2021-03-27 ENCOUNTER — Encounter: Payer: Self-pay | Admitting: Podiatry

## 2021-03-27 ENCOUNTER — Other Ambulatory Visit: Payer: Self-pay

## 2021-03-27 ENCOUNTER — Ambulatory Visit (INDEPENDENT_AMBULATORY_CARE_PROVIDER_SITE_OTHER): Payer: Medicare Other | Admitting: Podiatry

## 2021-03-27 DIAGNOSIS — E10621 Type 1 diabetes mellitus with foot ulcer: Secondary | ICD-10-CM

## 2021-03-27 DIAGNOSIS — M79672 Pain in left foot: Secondary | ICD-10-CM | POA: Diagnosis not present

## 2021-03-27 DIAGNOSIS — L97411 Non-pressure chronic ulcer of right heel and midfoot limited to breakdown of skin: Secondary | ICD-10-CM

## 2021-03-27 DIAGNOSIS — M79671 Pain in right foot: Secondary | ICD-10-CM

## 2021-03-27 DIAGNOSIS — I6522 Occlusion and stenosis of left carotid artery: Secondary | ICD-10-CM

## 2021-03-28 ENCOUNTER — Telehealth: Payer: Self-pay | Admitting: Podiatry

## 2021-03-28 NOTE — Telephone Encounter (Signed)
Called pt and left message for pt to call to schediule and appt for diabetic shoe measurements.

## 2021-03-30 NOTE — Progress Notes (Signed)
Subjective:   Patient ID: Kenneth [name], male   DOB: 65 y.o.   MRN: [id number]   HPI Patient presents with translator stating that he has had some breakdown of tissue on the outside of his right foot x3 months that has been not painful but they are concerned because he has diabetes and wanted it checked with language barriers that are difficult.  Patient does not smoke and is not active   Review of Systems  All other systems reviewed and are negative.      Objective:  Physical Exam Vitals and nursing note reviewed.  Constitutional:      Appearance: He is well-developed.  Pulmonary:     Effort: Pulmonary effort is normal.  Musculoskeletal:        General: Normal range of motion.  Skin:    General: Skin is warm.  Neurological:     Mental Status: He is alert.    Neurovascular status intact muscle strength adequate range of motion adequate with patient found to have a breakdown of tissue lateral side right foot localized to the fifth metatarsal base measuring about 1 cm in length by 5 mm in width with no proximal edema or edema drainage noted no bone tendon exposure and no significant subcutaneous exposure with no odor admitting from the area and like serous drainage     Assessment:  Low-grade ulceration with long-term history of diabetes under good control with language barrier     Plan:  H&P condition reviewed at this point I went ahead I debrided the area I flushed it I did not note any tendon or bone exposure with very only several millimeters of depth I debrided tissue around the sides of it and applied Iodosorb sterile dressing and I instructed with home wet-to-dry dressings and that this should heal uneventfully as it is very small localized with no proximal spread.  I gave strict instructions of any erythema edema drainage or pathology were to occur patient is to go to the emergency room and let us know in the possibility at 1 point in future and amputation may be necessary is  present given his history  X-rays dated today did not indicate any osteolysis or indications soft tissue bubbling or indications of infective process

## 2021-04-01 ENCOUNTER — Encounter (HOSPITAL_COMMUNITY): Payer: Medicare Other

## 2021-04-03 ENCOUNTER — Encounter (HOSPITAL_COMMUNITY): Payer: Self-pay

## 2021-04-03 ENCOUNTER — Ambulatory Visit (HOSPITAL_COMMUNITY)
Admission: RE | Admit: 2021-04-03 | Discharge: 2021-04-03 | Disposition: A | Discharge status: Home / Self Care | Payer: Medicare Other | Source: Ambulatory Visit | Attending: Family Medicine | Admitting: Family Medicine

## 2021-04-03 ENCOUNTER — Other Ambulatory Visit: Payer: Self-pay

## 2021-04-03 VITALS — BP 112/57 | HR 64 | Wt 129.2 lb

## 2021-04-03 DIAGNOSIS — I5032 Chronic diastolic (congestive) heart failure: Secondary | ICD-10-CM | POA: Diagnosis not present

## 2021-04-03 DIAGNOSIS — E1122 Type 2 diabetes mellitus with diabetic chronic kidney disease: Secondary | ICD-10-CM | POA: Diagnosis not present

## 2021-04-03 DIAGNOSIS — I5042 Chronic combined systolic (congestive) and diastolic (congestive) heart failure: Secondary | ICD-10-CM | POA: Insufficient documentation

## 2021-04-03 DIAGNOSIS — I132 Hypertensive heart and chronic kidney disease with heart failure and with stage 5 chronic kidney disease, or end stage renal disease: Secondary | ICD-10-CM | POA: Diagnosis not present

## 2021-04-03 DIAGNOSIS — I214 Non-ST elevation (NSTEMI) myocardial infarction: Secondary | ICD-10-CM | POA: Diagnosis not present

## 2021-04-03 DIAGNOSIS — Z955 Presence of coronary angioplasty implant and graft: Secondary | ICD-10-CM | POA: Diagnosis not present

## 2021-04-03 DIAGNOSIS — I2510 Atherosclerotic heart disease of native coronary artery without angina pectoris: Secondary | ICD-10-CM | POA: Diagnosis not present

## 2021-04-03 DIAGNOSIS — I25810 Atherosclerosis of coronary artery bypass graft(s) without angina pectoris: Secondary | ICD-10-CM | POA: Diagnosis not present

## 2021-04-03 DIAGNOSIS — E785 Hyperlipidemia, unspecified: Secondary | ICD-10-CM | POA: Diagnosis not present

## 2021-04-03 DIAGNOSIS — Z7901 Long term (current) use of anticoagulants: Secondary | ICD-10-CM | POA: Insufficient documentation

## 2021-04-03 DIAGNOSIS — Z992 Dependence on renal dialysis: Secondary | ICD-10-CM | POA: Insufficient documentation

## 2021-04-03 DIAGNOSIS — Z79899 Other long term (current) drug therapy: Secondary | ICD-10-CM | POA: Diagnosis not present

## 2021-04-03 DIAGNOSIS — I480 Paroxysmal atrial fibrillation: Secondary | ICD-10-CM

## 2021-04-03 DIAGNOSIS — R001 Bradycardia, unspecified: Secondary | ICD-10-CM | POA: Insufficient documentation

## 2021-04-03 DIAGNOSIS — Z794 Long term (current) use of insulin: Secondary | ICD-10-CM | POA: Insufficient documentation

## 2021-04-03 DIAGNOSIS — I10 Essential (primary) hypertension: Secondary | ICD-10-CM

## 2021-04-03 DIAGNOSIS — N186 End stage renal disease: Secondary | ICD-10-CM | POA: Diagnosis not present

## 2021-04-03 LAB — BASIC METABOLIC PANEL
Anion gap: 13 (ref 5–15)
BUN: 31 mg/dL — ABNORMAL HIGH (ref 8–23)
CO2: 32 mmol/L (ref 22–32)
Calcium: 8.9 mg/dL (ref 8.9–10.3)
Chloride: 89 mmol/L — ABNORMAL LOW (ref 98–111)
Creatinine, Ser: 6.63 mg/dL — ABNORMAL HIGH (ref 0.61–1.24)
GFR, Estimated: 9 mL/min — ABNORMAL LOW (ref >60)
Glucose, Bld: 160 mg/dL — ABNORMAL HIGH (ref 70–99)
Potassium: 6.1 mmol/L — ABNORMAL HIGH (ref 3.5–5.1)
Sodium: 134 mmol/L — ABNORMAL LOW (ref 135–145)

## 2021-04-03 LAB — CBC
HCT: 32.9 % — ABNORMAL LOW (ref 39.0–52.0)
Hemoglobin: 10.9 g/dL — ABNORMAL LOW (ref 13.0–17.0)
MCH: 32.2 pg (ref 26.0–34.0)
MCHC: 33.1 g/dL (ref 30.0–36.0)
MCV: 97.3 fL (ref 80.0–100.0)
Platelets: 153 10*3/uL (ref 150–400)
RBC: 3.38 MIL/uL — ABNORMAL LOW (ref 4.22–5.81)
RDW: 17 % — ABNORMAL HIGH (ref 11.5–15.5)
WBC: 6.3 10*3/uL (ref 4.0–10.5)
nRBC: 0 % (ref 0.0–0.2)

## 2021-04-03 MED ORDER — LOSARTAN POTASSIUM 25 MG PO TABS: 12.5000 mg | ORAL_TABLET | Freq: Every day | ORAL | 5 refills | Status: DC

## 2021-04-03 NOTE — Progress Notes (Signed)
PCP: Dr. Tasia Catchings [facility]) Cardiology: Dr. Aundra Dubin  65 y.o. 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  He was admitted in 9/17 with NSTEMI.  LHC showed 3VD and he had CABG x 3.  He had CKD stage IV when he was admitted and developed ESRD requiring HD while in the hospital.  He had transient atrial fibrillation post-CABG.  He developed cardiogenic shock requiring milrinone but was able to taper off.  Echo in 9/17 showed EF 35-40%.    Admitted 8/1 through 04/16/2017 with malignant hypertension due to medication noncompliance. Continues on HD.  HF M-W-F. Discharge weight 140 pounds.   Admitted 10/18 with chest pain and pulmonary edema. NSTEMI noted on admit. Echo showed preserved EF but + WMAs. Cath with 95% ostial SVG-OM1 stenosis with TIMI 2 flow in graft.  Patient had DES to SVG-OM1.   Admitted in 12/18 with atypical chest pain, suspect not cardiac.   NSTEMI in 7/19 with severe in-stent restenosis in SVG-OM1, had DES x 2 to native OM1.  Echo showed EF 50%.  Atrial fibrillation was noted this admission.   He has had a chronic pattern of atypical chest pain.  Cardiolite in 3/20 showed EF 54%, small fixed inferior defect with no ischemia.   He was admitted in 8/20 with severe episode of chest pain.  Hs-TnI was mildly elevated without trend.  Cath was done, showing no target for PCI and stable disease. Echo was done showing EF 50-55% with moderate LVH and normal RV.    He was admitted in 4/22 with CHF and chest pain, elevated HS-TnI to 1673.  Volume overloaded treated with daily HD in hospital.  He had RHC/LHC done after he had been dialyzed x 2, showing normal filling pressures and unchanged coronary anatomy.  Echo showed EF 55-60%, RV normal.   He presented 5/22 for followup of CHF and CAD. Still short of breath walking a short distance.  His BP meds were increased and renal asked to lower his dry  weight at dialysis as he was struggling with NYHA II symptoms and volume was up.  Today he returns for HF follow up. He is here with his wife. Overall feeling fine. Denies increasing SOB, CP, or PND/Orthopnea. Appetite ok. No fever or chills. Weight at home 130-136 lbs. Taking all medications. He has had some low BPs at HD, ~80/40s. Occasionally dizzy, especially after dialysis.  REDS clip 32%.   Labs (12/18): LDL 64, HDL 31 Labs (7/19): LDL 36  Labs (2/20): hgb 11.3, LDL 59, HDL 28 Labs (2/22): LDL 52, HDL 37 Labs (4/22): hgb 9.9  PMH: 1. Type II diabetes 2. HTN 3. ESRD 4. CAD: NSTEMI 9/17 with CABG x 3 (LIMA-LAD, SVG-OM, SVG-RCA).   - NSTEMI 10/18 s/p DES to ostial SVG-OM - NSTEMI 7/19: severe in-stent restenosis in SVG-ramus, chronic total occlusion of SVG-RCA.  DES x 2 to native ramus.  - Cardiolite (3/20): EF 54%, small fixed inferior defect with no ischemia.  - LHC (8/20): 90% pLAD, subtotal occlusion mLAD, LIMA-LAD patent, occluded SVG-high OM1, 40-50% in-stent restenosis in native OM1, 80% ostial small AV LCx (not target for intervention), occluded RCA, occluded SVG-RCA (chronic with left=>right collaterals).  No target for intervention.  - LHC (4/22): 90% pLAD, subtotal occlusion mLAD, LIMA-LAD patent, occluded SVG-high OM1, 50% in-stent restenosis in native OM1, 85% ostial small AV LCx (not target for intervention), occluded RCA, occluded SVG-RCA (  chronic with left=>right collaterals).  No target for intervention.  5. Chronic systolic CHF: Ischemic cardiomyopathy.   - Echo (9/17) with EF 35-40%, grade II diastolic dysfunction.  - Echo (1/18) with EF 60-65%, grade II diastolic dysfunction. - Echo (10/18) with EF 55%, Akinesis of the basal-midinferolateral, inferior, and inferoseptal myocardiam. Akinesis of the basal anteroseptal myocardium.  - Echo (7/19): EF 50%, moderate LVH, mildly decreased RV systolic function.  - Echo (8/20): EF 50-55%, moderate LVH, normal RV - Echo  (4/22): EF 55-60%, normal RV 6. Atrial fibrillation: Paroxysmal.  Only noted post-op CABG.  7. ABIs normal in 12/17. 5/18 ABIs normal.  1/19 ABIs normal. 4/20 ABIs normal.  8. Carotid stenosis: Carotid dopplers (12/18) with < 50% right common carotid stenosis, 123456 LICA stenosis.  - Carotid dopplers (1/20): < 50% BICA stenosis.  - Carotid dopplers (5/21): Mild bilateral disease.  9. Atrial fibrillation: Paroxysmal.  10. 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 Sciatica  SH: Married, lives in [place], 1 son, nonsmoker, no ETOH, speaks only [place].   FH: No premature CAD.  Review of systems complete and found to be negative unless listed in HPI.    Current Outpatient Medications  Medication Sig Dispense Refill   acetaminophen (TYLENOL) 500 MG tablet Take 1,000 mg by mouth every 6 (six) hours as needed for mild pain or headache.      apixaban (ELIQUIS) 5 MG TABS tablet Take 1 tablet (5 mg total) by mouth 2 (two) times daily. 60 tablet 6   Ascorbic Acid (VITAMIN C) 1000 MG tablet Take 1,000 mg by mouth daily.     atorvastatin (LIPITOR) 80 MG tablet Take 1 tablet by mouth once daily 90 tablet 0   calcium acetate (PHOSLO) 667 MG capsule Take 667 mg by mouth 3 (three) times daily with meals.      citalopram (CELEXA) 20 MG tablet Take 20 mg by mouth daily with lunch.   1   ezetimibe (ZETIA) 10 MG tablet Take 1 tablet by mouth once daily 90 tablet 0   gabapentin (NEURONTIN) 100 MG capsule Take 1 capsule (100 mg total) by mouth at bedtime.     hydrALAZINE (APRESOLINE) 100 MG tablet Take 100 mg by mouth 3 (three) times daily.     Insulin Human (INSULIN PUMP) SOLN Inject into the skin continuous. NOVOLOG     isosorbide mononitrate (IMDUR) 60 MG 24 hr tablet Take 60 mg by mouth daily.     losartan (COZAAR) 25 MG tablet Take 1 tablet (25 mg total) by mouth at bedtime. 30 tablet 5   Methoxy PEG-Epoetin Beta (MIRCERA IJ) Mircera     NOVOLOG 100 UNIT/ML injection Inject  6-10 Units into the skin See admin instructions. Starts with 10 units per day and adds 6 units with each meal via pump     pantoprazole (PROTONIX) 40 MG tablet Take 40 mg by mouth daily before lunch.   0   sodium zirconium cyclosilicate (LOKELMA) 10 g PACK packet Take 10 g by mouth See admin instructions. Takes on Tues, Thur, Sat only.     vitamin B-12 (CYANOCOBALAMIN) 500 MCG tablet Take 1 tablet (500 mcg total) by mouth daily. 30 tablet 0   No current facility-administered medications for this encounter.   BP (!) 112/57   Pulse 64   Wt 58.6 kg (129 lb 3.2 oz)   SpO2 96%   BMI 20.85 kg/m   Wt Readings from Last 3 Encounters:  04/03/21 58.6 kg (129  lb 3.2 oz)  03/06/21 61.7 kg (136 lb)  02/25/21 61.4 kg (135 lb 6.4 oz)   General:  NAD. No resp difficulty HEENT: Normal Neck: Supple. No JVD. Carotids 2+ bilat; no bruits. No lymphadenopathy or thryomegaly appreciated. Cor: PMI nondisplaced. Regular rate & rhythm. No rubs, gallops, II/VI SEM RUSB Lungs: Clear Abdomen: Soft, nontender, nondistended. No hepatosplenomegaly. No bruits or masses. Good bowel sounds. Extremities: No cyanosis, clubbing, rash, edema; left arm fistula +/+ Neuro: Alert & oriented x 3, cranial nerves grossly intact. Moves all 4 extremities w/o difficulty. Affect pleasant.  Assessment/Plan: 1. CAD: s/p CABG.  NSTEMI 10/18, cath with 95% ostial SVG-high OM1 stenosis with TIMI 2 flow in graft, s/p DES to SVG-OM1 07/07/17. NSTEMI 7/19 with in-stent restenosis in the SVG-ramus, had DES x 2 to native high OM1.   He has had chronic atypical chest pain episodes, Cardiolite in 3/20 showed no ischemia.  He had repeat cath in 8/20 showing occluded SVG-ramus (new) but continued patency of the native high OM1 s/p stenting.  No target for intervention.  LHC again in 4/22, no change from 8/20.  No intervention.  Chest pain/elevated troponin thought to be due to volume overload/demand ischemia.  No current s/s ischemia. - He will  continue Eliquis, so no ASA.  - Continue Imdur 60 mg daily. Recently decreased from 90 mg. - Continue atorvastatin 80 mg daily.  2. Hyperlipidemia: Continue atorvastatin and Zetia. Good lipids in 2/22.   3. Chronic diastolic CHF: EF low in past but improved after CABG. Most recent echo 4/22 with EF 55-60%. He was admitted in 4/22 with CHF.  Stable NYHA class II symptoms. He is not overloaded today by exam or by REDS clip (32%).  - Losartan started last visit. - On dialysis for volume control, dry weight recently reduced per last visit, now ~58 kg 4. Atrial fibrillation: Paroxysmal, NSR recently.  - Continue Eliquis 5 mg bid. CBC today. 5. HTN: BP on the low side today. He has had some recent low BP at dialysis. He does not hold his BP medications before his treatments. I have asked him and his wife to ask during next session if he needs to hold his meds prior to treatment. - Decrease losartan to 12.5 mg q hs. - Continue hydralazine 100 mg tid. May need to reduce further. - Continue Imdur to 60 mg daily.   - Off metoprolol XL with bradycardia. 6. ESRD: MWF. S/p left fistulogram (6/22). Sees Dr. Moshe Cipro. - He takes Lokelma on non-HD days.  Followup in 2 months with Dr. Aundra Dubin.  [place] FNP 04/03/2021

## 2021-04-03 NOTE — Progress Notes (Signed)
ReDS Vest / Clip - 04/03/21 0900       ReDS Vest / Clip   Station Marker C    Ruler Value 26    ReDS Value Range Low volume    ReDS Actual Value 32

## 2021-04-03 NOTE — Patient Instructions (Signed)
DECREASE Losartan to 12.5 mg, (one half tab) daily at bedtime  Labs today We will only contact you if something comes back abnormal or we need to make some changes. Otherwise no news is good news!  Be sure to ask HD if he needs to hold hydralazine, imdur, or losartan before HD days.  Your physician recommends that you schedule a follow-up appointment in: 2-3 months with Dr Aundra Dubin  Do the following things EVERYDAY: Weigh yourself in the morning before breakfast. Write it down and keep it in a log. Take your medicines as prescribed Eat low salt foods--Limit salt (sodium) to 2000 mg per day.  Stay as active as you can everyday Limit all fluids for the day to less than 2 liters  milAt the [facility], you and your health needs are our priority. As part of our continuing mission to provide you with exceptional heart care, we have created designated Provider Care Teams. These Care Teams include your primary Cardiologist (physician) and Advanced Practice Providers (APPs- Physician Assistants and Nurse Practitioners) who all work together to provide you with the care you need, when you need it.   You may see any of the following providers on your designated Care Team at your next follow up: Dr Glori Bickers Dr Loralie Champagne Dr Patrice Paradise, NP Lyda Jester, [place] Ginnie Smart Audry Riles, PharmD   Please be sure to bring in all your medications bottles to every appointment.

## 2021-04-07 ENCOUNTER — Other Ambulatory Visit (HOSPITAL_COMMUNITY): Payer: Self-pay | Admitting: Cardiology

## 2021-04-07 DIAGNOSIS — I5032 Chronic diastolic (congestive) heart failure: Secondary | ICD-10-CM

## 2021-04-08 ENCOUNTER — Other Ambulatory Visit: Payer: Self-pay

## 2021-04-08 ENCOUNTER — Ambulatory Visit (HOSPITAL_COMMUNITY)
Admission: RE | Admit: 2021-04-08 | Discharge: 2021-04-08 | Disposition: A | Discharge status: Home / Self Care | Payer: Medicare Other | Source: Ambulatory Visit | Attending: Cardiology | Admitting: Cardiology

## 2021-04-08 ENCOUNTER — Ambulatory Visit (INDEPENDENT_AMBULATORY_CARE_PROVIDER_SITE_OTHER): Payer: Medicare Other | Admitting: Physician Assistant

## 2021-04-08 VITALS — BP 145/56 | HR 64 | Temp 98.2°F | Resp 20 | Ht 66.0 in | Wt 135.5 lb

## 2021-04-08 DIAGNOSIS — N186 End stage renal disease: Secondary | ICD-10-CM | POA: Diagnosis not present

## 2021-04-08 DIAGNOSIS — I5032 Chronic diastolic (congestive) heart failure: Secondary | ICD-10-CM | POA: Diagnosis not present

## 2021-04-08 DIAGNOSIS — Z992 Dependence on renal dialysis: Secondary | ICD-10-CM

## 2021-04-08 LAB — BASIC METABOLIC PANEL
Anion gap: 11 (ref 5–15)
BUN: 32 mg/dL — ABNORMAL HIGH (ref 8–23)
CO2: 31 mmol/L (ref 22–32)
Calcium: 8.2 mg/dL — ABNORMAL LOW (ref 8.9–10.3)
Chloride: 93 mmol/L — ABNORMAL LOW (ref 98–111)
Creatinine, Ser: 6.99 mg/dL — ABNORMAL HIGH (ref 0.61–1.24)
GFR, Estimated: 8 mL/min — ABNORMAL LOW (ref >60)
Glucose, Bld: 135 mg/dL — ABNORMAL HIGH (ref 70–99)
Potassium: 5.2 mmol/L — ABNORMAL HIGH (ref 3.5–5.1)
Sodium: 135 mmol/L (ref 135–145)

## 2021-04-08 NOTE — Progress Notes (Signed)
Established Dialysis Access   History of Present Illness   Kenneth [name] is a 65 y.o. ([birth year]) male who presents for follow up of left AV fistula. He was seen in June with concerns about scab on fistula. He subsequently had a Fistulogram on 03/06/21 by Dr. Carlis Abbott. He has some left cephalic vein in stent restenosis as well as in cephalic arch with was treated with Balloon angioplasty. His fistula was looking better as far as skin integrity so an open revision was not recommended. He reports today that he fistula is working well. He says he had some tingling/ pain like sensation in left shoulder area after the procedure for couple weeks intermittently but now it is  better. He reports no steal symptoms. Denies any bleeding. He currently dialyzes on MWF at the [facility] location  Current Outpatient Medications  Medication Sig Dispense Refill   acetaminophen (TYLENOL) 500 MG tablet Take 1,000 mg by mouth every 6 (six) hours as needed for mild pain or headache.      apixaban (ELIQUIS) 5 MG TABS tablet Take 1 tablet (5 mg total) by mouth 2 (two) times daily. 60 tablet 6   Ascorbic Acid (VITAMIN C) 1000 MG tablet Take 1,000 mg by mouth daily.     atorvastatin (LIPITOR) 80 MG tablet Take 1 tablet by mouth once daily 90 tablet 0   calcium acetate (PHOSLO) 667 MG capsule Take 667 mg by mouth 3 (three) times daily with meals.      citalopram (CELEXA) 20 MG tablet Take 20 mg by mouth daily with lunch.   1   ezetimibe (ZETIA) 10 MG tablet Take 1 tablet by mouth once daily 90 tablet 0   gabapentin (NEURONTIN) 100 MG capsule Take 1 capsule (100 mg total) by mouth at bedtime.     glucose blood (CONTOUR NEXT TEST) test strip Test BS 5 times a day     hydrALAZINE (APRESOLINE) 100 MG tablet Take 100 mg by mouth 3 (three) times daily.     insulin glargine, 2 Unit Dial, (TOUJEO MAX SOLOSTAR) 300 UNIT/ML Solostar Pen Inject into the skin.     Insulin Human (INSULIN PUMP) SOLN Inject into the skin continuous.  NOVOLOG     isosorbide mononitrate (IMDUR) 60 MG 24 hr tablet Take 60 mg by mouth daily.     losartan (COZAAR) 25 MG tablet Take 0.5 tablets (12.5 mg total) by mouth at bedtime. 15 tablet 5   Methoxy PEG-Epoetin Beta (MIRCERA IJ) Mircera     NOVOLOG 100 UNIT/ML injection Inject 6-10 Units into the skin See admin instructions. Starts with 10 units per day and adds 6 units with each meal via pump     pantoprazole (PROTONIX) 40 MG tablet Take 40 mg by mouth daily before lunch.   0   sodium zirconium cyclosilicate (LOKELMA) 10 g PACK packet Take 10 g by mouth See admin instructions. Takes on Tues, Thur, Sat only.     vitamin B-12 (CYANOCOBALAMIN) 500 MCG tablet Take 1 tablet (500 mcg total) by mouth daily. 30 tablet 0   No current facility-administered medications for this visit.    On ROS today: negative unless stated in HPI   Physical Examination   Vitals:   04/08/21 1135  BP: (!) 145/56  Pulse: 64  Resp: 20  Temp: 98.2 F (36.8 C)  TempSrc: Temporal  SpO2: 98%  Weight: 135 lb 8 oz (61.5 kg)  Height: '5\' 6"'$  (1.676 m)   Body mass index is 21.87  kg/m.  General Alert, O x 3, WD, NAD  Pulmonary Sym exp, good B air movt, CTA B  Cardiac regular , no Murmurs  Vascular Vessel Right Left  Radial Palpable Palpable  Brachial Palpable Palpable  Ulnar Not palpable Not palpable    Musculo- skeletal M/S 5/5 throughout  , Extremities without ischemic changes    Neurologic A&O; CN grossly intact  Left AV fistula with good thrill. No pulsatility. Some hypopigmentation overlying aneurysmal areas but no ulceration or scabs        Medical Decision Making   Kenneth [name] is a 65 y.o. male who presents with for follow up of left AV fistula. Fistula is functioning well. Doing well following Fistulogram with angioplasty of cephalic vein by Dr. Carlis Abbott on 03/06/21. Skin is intact with no concerns. No revision is indicated at this time. He can follow up as needed if he has any new or concerning  symptoms or issues with his dialysis access.   Karoline Caldwell, PA-C [facility] Office: [phone]  Clinic MD: Roxanne Mins

## 2021-04-17 ENCOUNTER — Other Ambulatory Visit: Payer: Medicare Other

## 2021-04-17 ENCOUNTER — Telehealth: Payer: Self-pay | Admitting: *Deleted

## 2021-04-17 ENCOUNTER — Other Ambulatory Visit: Payer: Self-pay

## 2021-04-17 NOTE — Telephone Encounter (Signed)
Diabetic shoe measurements-04/17/21

## 2021-05-18 ENCOUNTER — Other Ambulatory Visit (HOSPITAL_COMMUNITY): Payer: Self-pay | Admitting: Cardiology

## 2021-05-20 ENCOUNTER — Telehealth: Payer: Self-pay | Admitting: Podiatry

## 2021-05-20 NOTE — Telephone Encounter (Signed)
Pts wife called checking on status of pts diabetic shoes.  Upon looking they should be shipping anytime and I told her I would call when they come in to schedule an appt for pt.

## 2021-06-05 ENCOUNTER — Other Ambulatory Visit: Payer: Self-pay

## 2021-06-05 ENCOUNTER — Ambulatory Visit (INDEPENDENT_AMBULATORY_CARE_PROVIDER_SITE_OTHER): Payer: Medicare Other

## 2021-06-05 DIAGNOSIS — E1040 Type 1 diabetes mellitus with diabetic neuropathy, unspecified: Secondary | ICD-10-CM | POA: Diagnosis not present

## 2021-06-05 DIAGNOSIS — L97411 Non-pressure chronic ulcer of right heel and midfoot limited to breakdown of skin: Secondary | ICD-10-CM

## 2021-06-05 DIAGNOSIS — L89899 Pressure ulcer of other site, unspecified stage: Secondary | ICD-10-CM

## 2021-06-05 DIAGNOSIS — E10621 Type 1 diabetes mellitus with foot ulcer: Secondary | ICD-10-CM

## 2021-06-05 NOTE — Progress Notes (Signed)
Patient in office today to pick-up diabetic shoes and custom inserts. Patient tried on the shoes with the custom inserts and was satisfied with the fit and feel of the shoe and inserts. Patient was educated on the break-in process and return policy at this time. Patient verbalized understanding using the teach back method. Advised patient to call the office with any questions, comments, or concerns.

## 2021-07-06 ENCOUNTER — Other Ambulatory Visit (HOSPITAL_COMMUNITY): Payer: Self-pay | Admitting: Cardiology

## 2021-07-08 ENCOUNTER — Encounter (HOSPITAL_COMMUNITY): Payer: Medicare Other | Admitting: Cardiology

## 2021-07-10 ENCOUNTER — Other Ambulatory Visit: Payer: Self-pay

## 2021-07-10 ENCOUNTER — Ambulatory Visit (HOSPITAL_COMMUNITY)
Admission: RE | Admit: 2021-07-10 | Discharge: 2021-07-10 | Disposition: A | Discharge status: Home / Self Care | Payer: Medicare Other | Source: Ambulatory Visit | Attending: Cardiology | Admitting: Cardiology

## 2021-07-10 ENCOUNTER — Encounter (HOSPITAL_COMMUNITY): Payer: Self-pay | Admitting: Cardiology

## 2021-07-10 VITALS — BP 138/66 | HR 64 | Wt 143.0 lb

## 2021-07-10 DIAGNOSIS — M546 Pain in thoracic spine: Secondary | ICD-10-CM

## 2021-07-10 DIAGNOSIS — I214 Non-ST elevation (NSTEMI) myocardial infarction: Secondary | ICD-10-CM | POA: Diagnosis not present

## 2021-07-10 DIAGNOSIS — Z794 Long term (current) use of insulin: Secondary | ICD-10-CM | POA: Diagnosis not present

## 2021-07-10 DIAGNOSIS — E118 Type 2 diabetes mellitus with unspecified complications: Secondary | ICD-10-CM | POA: Diagnosis not present

## 2021-07-10 DIAGNOSIS — Z7901 Long term (current) use of anticoagulants: Secondary | ICD-10-CM | POA: Diagnosis not present

## 2021-07-10 DIAGNOSIS — R0600 Dyspnea, unspecified: Secondary | ICD-10-CM

## 2021-07-10 DIAGNOSIS — Z992 Dependence on renal dialysis: Secondary | ICD-10-CM | POA: Insufficient documentation

## 2021-07-10 DIAGNOSIS — N186 End stage renal disease: Secondary | ICD-10-CM | POA: Insufficient documentation

## 2021-07-10 DIAGNOSIS — I25810 Atherosclerosis of coronary artery bypass graft(s) without angina pectoris: Secondary | ICD-10-CM | POA: Diagnosis not present

## 2021-07-10 DIAGNOSIS — I2510 Atherosclerotic heart disease of native coronary artery without angina pectoris: Secondary | ICD-10-CM | POA: Diagnosis not present

## 2021-07-10 DIAGNOSIS — Z955 Presence of coronary angioplasty implant and graft: Secondary | ICD-10-CM | POA: Diagnosis not present

## 2021-07-10 DIAGNOSIS — I5032 Chronic diastolic (congestive) heart failure: Secondary | ICD-10-CM

## 2021-07-10 DIAGNOSIS — I480 Paroxysmal atrial fibrillation: Secondary | ICD-10-CM | POA: Diagnosis not present

## 2021-07-10 DIAGNOSIS — Z79899 Other long term (current) drug therapy: Secondary | ICD-10-CM | POA: Diagnosis not present

## 2021-07-10 DIAGNOSIS — M545 Low back pain, unspecified: Secondary | ICD-10-CM | POA: Insufficient documentation

## 2021-07-10 DIAGNOSIS — I132 Hypertensive heart and chronic kidney disease with heart failure and with stage 5 chronic kidney disease, or end stage renal disease: Secondary | ICD-10-CM | POA: Diagnosis present

## 2021-07-10 DIAGNOSIS — G8929 Other chronic pain: Secondary | ICD-10-CM | POA: Diagnosis not present

## 2021-07-10 DIAGNOSIS — I5042 Chronic combined systolic (congestive) and diastolic (congestive) heart failure: Secondary | ICD-10-CM | POA: Diagnosis not present

## 2021-07-10 DIAGNOSIS — E1122 Type 2 diabetes mellitus with diabetic chronic kidney disease: Secondary | ICD-10-CM | POA: Insufficient documentation

## 2021-07-10 DIAGNOSIS — E1151 Type 2 diabetes mellitus with diabetic peripheral angiopathy without gangrene: Secondary | ICD-10-CM

## 2021-07-10 DIAGNOSIS — E785 Hyperlipidemia, unspecified: Secondary | ICD-10-CM | POA: Insufficient documentation

## 2021-07-10 LAB — LIPID PANEL
Cholesterol: 100 mg/dL (ref 0–200)
HDL: 46 mg/dL (ref >40)
LDL Cholesterol: 48 mg/dL (ref 0–99)
Total CHOL/HDL Ratio: 2.2 RATIO
Triglycerides: 31 mg/dL (ref <150)
VLDL: 6 mg/dL (ref 0–40)

## 2021-07-10 MED ORDER — ISOSORBIDE MONONITRATE ER 60 MG PO TB24: 60.0000 mg | ORAL_TABLET | Freq: Every day | ORAL | Status: DC

## 2021-07-10 MED ORDER — HYDRALAZINE HCL 100 MG PO TABS: 100.0000 mg | ORAL_TABLET | Freq: Three times a day (TID) | ORAL | Status: DC

## 2021-07-10 NOTE — Progress Notes (Signed)
PCP: Dr. Tasia Catchings [facility]) Cardiology: Dr. Aundra Dubin  65 y.o. 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  He was admitted in 9/17 with NSTEMI.  LHC showed 3VD and he had CABG x 3.  He had CKD stage IV when he was admitted and developed ESRD requiring HD while in the hospital.  He had transient atrial fibrillation post-CABG.  He developed cardiogenic shock requiring milrinone but was able to taper off.  Echo in 9/17 showed EF 35-40%.    Admitted 8/1 through 04/16/2017 with malignant hypertension due to medication noncompliance. Continues on HD.  HF M-W-F. Discharge weight 140 pounds.   Admitted 10/18 with chest pain and pulmonary edema. NSTEMI noted on admit. Echo showed preserved EF but + WMAs. Cath with 95% ostial SVG-OM1 stenosis with TIMI 2 flow in graft.  Patient had DES to SVG-OM1.   Admitted in 12/18 with atypical chest pain, suspect not cardiac.   NSTEMI in 7/19 with severe in-stent restenosis in SVG-OM1, had DES x 2 to native OM1.  Echo showed EF 50%.  Atrial fibrillation was noted this admission.   He has had a chronic pattern of atypical chest pain.  Cardiolite in 3/20 showed EF 54%, small fixed inferior defect with no ischemia.   He was admitted in 8/20 with severe episode of chest pain.  Hs-TnI was mildly elevated without trend.  Cath was done, showing no target for PCI and stable disease. Echo was done showing EF 50-55% with moderate LVH and normal RV.    He was admitted in 4/22 with CHF and chest pain, elevated HS-TnI to 1673.  Volume overloaded treated with daily HD in hospital.  He had RHC/LHC done after he had been dialyzed x 2, showing normal filling pressures and unchanged coronary anatomy.  Echo showed EF 55-60%, RV normal.   He presents today for followup of CHF and CAD.  He remains short of breath with exertion such as walking about 100 feet or walking up stairs.  He also  reports left upper chest pain.  This is achy and comes/goes.  It has been present on and off for a month.  When the pain occurs, it is not exertional and lasts about 20 seconds.  It improves if he rubs the site. He continues to have bilateral foot burning, presumably from diabetic neuropathy. BP is low at times at HD.  He takes his hydralazine the morning of HD. Weight is up 14 lbs.   ECG (personally reviewed): NSR, anterolateral TWIs (no change from prior)  Labs (12/18): LDL 64, HDL 31 Labs (7/19): LDL 36  Labs (2/20): hgb 11.3, LDL 59, HDL 28 Labs (2/22): LDL 52, HDL 37 Labs (4/22): hgb 9.9 Labs (7/22): hgb 10.9  PMH: 1. Type II diabetes 2. HTN 3. ESRD 4. CAD: NSTEMI 9/17 with CABG x 3 (LIMA-LAD, SVG-OM, SVG-RCA).   - NSTEMI 10/18 s/p DES to ostial SVG-OM - NSTEMI 7/19: severe in-stent restenosis in SVG-ramus, chronic total occlusion of SVG-RCA.  DES x 2 to native ramus.  - Cardiolite (3/20): EF 54%, small fixed inferior defect with no ischemia.  - LHC (8/20): 90% pLAD, subtotal occlusion mLAD, LIMA-LAD patent, occluded SVG-high OM1, 40-50% in-stent restenosis in native OM1, 80% ostial small AV LCx (not target for intervention), occluded RCA, occluded SVG-RCA (chronic with left=>right collaterals).  No target for intervention.  - LHC (4/22): 90% pLAD, subtotal occlusion mLAD, LIMA-LAD patent, occluded  SVG-high OM1, 50% in-stent restenosis in native OM1, 85% ostial small AV LCx (not target for intervention), occluded RCA, occluded SVG-RCA (chronic with left=>right collaterals).  No target for intervention.  5. Chronic systolic CHF: Ischemic cardiomyopathy.   - Echo (9/17) with EF 35-40%, grade II diastolic dysfunction.  - Echo (1/18) with EF 60-65%, grade II diastolic dysfunction. - Echo (10/18) with EF 55%, Akinesis of the basal-midinferolateral, inferior, and inferoseptal myocardiam. Akinesis of the basal anteroseptal myocardium.  - Echo (7/19): EF 50%, moderate LVH, mildly decreased RV  systolic function.  - Echo (8/20): EF 50-55%, moderate LVH, normal RV - Echo (4/22): EF 55-60%, normal RV 6. Atrial fibrillation: Paroxysmal.  Only noted post-op CABG.  7. ABIs normal in 12/17. 5/18 ABIs normal.  1/19 ABIs normal. 4/20 ABIs normal.  8. Carotid stenosis: Carotid dopplers (12/18) with < 50% right common carotid stenosis, 8-24% LICA stenosis.  - Carotid dopplers (1/20): < 50% BICA stenosis.  - Carotid dopplers (5/21): Mild bilateral disease.  9. Atrial fibrillation: Paroxysmal.  10. 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 Sciatica  SH: Married, lives in [place], 1 son, nonsmoker, no ETOH, speaks only [place].   FH: No premature CAD.  Review of systems complete and found to be negative unless listed in HPI.    Current Outpatient Medications  Medication Sig Dispense Refill   acetaminophen (TYLENOL) 500 MG tablet Take 1,000 mg by mouth every 6 (six) hours as needed for mild pain or headache.      apixaban (ELIQUIS) 5 MG TABS tablet Take 1 tablet (5 mg total) by mouth 2 (two) times daily. 60 tablet 6   Ascorbic Acid (VITAMIN C) 1000 MG tablet Take 1,000 mg by mouth daily.     atorvastatin (LIPITOR) 80 MG tablet Take 1 tablet by mouth once daily 90 tablet 0   calcium acetate (PHOSLO) 667 MG capsule Take 667 mg by mouth 3 (three) times daily with meals.      citalopram (CELEXA) 20 MG tablet Take 20 mg by mouth daily with lunch.   1   ezetimibe (ZETIA) 10 MG tablet Take 10 mg by mouth daily.     gabapentin (NEURONTIN) 100 MG capsule Take 1 capsule (100 mg total) by mouth at bedtime.     glucose blood (CONTOUR NEXT TEST) test strip Test BS 5 times a day     insulin glargine, 2 Unit Dial, (TOUJEO MAX SOLOSTAR) 300 UNIT/ML Solostar Pen Inject into the skin.     Insulin Human (INSULIN PUMP) SOLN Inject into the skin continuous. NOVOLOG     losartan (COZAAR) 25 MG tablet TAKE 1 TABLET BY MOUTH AT BEDTIME 30 tablet 0   Methoxy PEG-Epoetin Beta (MIRCERA  IJ) Mircera     NOVOLOG 100 UNIT/ML injection Inject 6-10 Units into the skin See admin instructions. Starts with 10 units per day and adds 6 units with each meal via pump     pantoprazole (PROTONIX) 40 MG tablet Take 40 mg by mouth daily before lunch.   0   sodium zirconium cyclosilicate (LOKELMA) 10 g PACK packet Take 10 g by mouth See admin instructions. Takes on Tues, Thur, Sat only.     vitamin B-12 (CYANOCOBALAMIN) 500 MCG tablet Take 1 tablet (500 mcg total) by mouth daily. 30 tablet 0   hydrALAZINE (APRESOLINE) 100 MG tablet Take 1 tablet (100 mg total) by mouth 3 (three) times daily. DO NOT TAKE AM AND NOON DOSES ON HD DAYS  isosorbide mononitrate (IMDUR) 60 MG 24 hr tablet Take 1 tablet (60 mg total) by mouth daily. DO NOT TAKE ON HD DAYS     No current facility-administered medications for this encounter.   Vitals:   07/10/21 1026  BP: 138/66  Pulse: 64  SpO2: 99%  Weight: 64.9 kg (143 lb)   Wt Readings from Last 3 Encounters:  07/10/21 64.9 kg (143 lb)  04/08/21 61.5 kg (135 lb 8 oz)  04/03/21 58.6 kg (129 lb 3.2 oz)   General: NAD Neck: JVP 8 cm, no thyromegaly or thyroid nodule.  Lungs: Clear to auscultation bilaterally with normal respiratory effort. CV: Nondisplaced PMI.  Heart regular S1/S2, no S3/S4, 1/6 SEM RUSB.  No peripheral edema.  No carotid bruit.  Normal pedal pulses.  Abdomen: Soft, nontender, no hepatosplenomegaly, no distention.  Skin: Intact without lesions or rashes.  Neurologic: Alert and oriented x 3.  Psych: Normal affect. Extremities: No clubbing or cyanosis.  HEENT: Normal.   Assessment/Plan: 1. CAD: s/p CABG.  NSTEMI 10/18, cath with 95% ostial SVG-high OM1 stenosis with TIMI 2 flow in graft, s/p DES to SVG-OM1 07/07/17. NSTEMI 7/19 with in-stent restenosis in the SVG-ramus, had DES x 2 to native high OM1.   He has had chronic atypical chest pain episodes, Cardiolite in 3/20 showed no ischemia.  He had repeat cath in 8/20 showing occluded  SVG-ramus (new) but continued patency of the native high OM1 s/p stenting.  No target for intervention.  LHC again in 4/22, no change from 8/20.  No intervention.  Chest pain/elevated troponin thought to be due to volume overload/demand ischemia. He continues to have episodes of atypical chest pain, suspect nonischemic.  ECG with no change from prior.   - I will get echo to assess for any change in LV function.   - He will continue Eliquis so not on ASA.  - Continue Imdur 60 mg daily.  - Continue atorvastatin 80 mg daily.  Check lipids today.  2. Hyperlipidemia: Continue atorvastatin and Zetia. Check lipids today.    3. Chronic diastolic CHF: EF low in past but improved after CABG. Most recent echo 4/22 with EF 55-60%.  NYHA class III symptoms, weight is up.  I suspect that his exertional dyspnea is related to CHF.    - On dialysis for volume control => ask renal to lower his dry weight again.  4. Atrial fibrillation: Paroxysmal, NSR recently.  - Continue Eliquis 5 mg bid.  5. HTN: He gets lightheaded with BP drop at dialysis.  - Do NOT take the first 2 doses of hydralazine on HD days.   - Continue qhs losartan.  6. ESRD: MWF. As above, needs dry weight lowered based on NYHA class III symptoms and weight gain.     7. Type 2 diabetes: With diabetic neuropathy.  - He would like endocrinology referral, will make this.  8. Low back pain: Chronic, followed by orthopedist in [place], wants to be seen in [place].  - Will refer to [facility].   Followup in 1 month with APP.   Loralie Champagne 07/10/2021

## 2021-07-10 NOTE — Patient Instructions (Signed)
On Dialysis days DO NOT TAKE:   --Hydralazine in AM or at [place] done today, your results will be available in MyChart, we will contact you for abnormal readings.  Your physician has requested that you have an echocardiogram. Echocardiography is a painless test that uses sound waves to create images of your heart. It provides your doctor with information about the size and shape of your heart and how well your heart's chambers and valves are working. This procedure takes approximately one hour. There are no restrictions for this procedure.  You have been referred to [facility] Endocrinology, they will call you for an appointment  You have been referred to Orthopedics, they will call you for an appointment  Your physician recommends that you schedule a follow-up appointment in: 1 month  If you have any questions or concerns before your next appointment please send [place] a message through [facility] or call our office at [phone].    TO LEAVE A MESSAGE FOR THE NURSE SELECT OPTION 2, PLEASE LEAVE A MESSAGE INCLUDING: YOUR NAME DATE OF BIRTH CALL BACK NUMBER REASON FOR CALL**this is important as we prioritize the call backs  YOU WILL RECEIVE A CALL BACK THE SAME DAY AS LONG AS YOU CALL BEFORE 4:00 PM  At the [facility], you and your health needs are our priority. As part of our continuing mission to provide you with exceptional heart care, we have created designated Provider Care Teams. These Care Teams include your primary Cardiologist (physician) and Advanced Practice Providers (APPs- Physician Assistants and Nurse Practitioners) who all work together to provide you with the care you need, when you need it.   You may see any of the following providers on your designated Care Team at your next follow up: Dr Glori Bickers Dr Haynes Kerns, NP Lyda Jester, [place] [facility] [place], [place] Audry Riles, PharmD   Please be sure to  bring in all your medications bottles to every appointment.

## 2021-07-17 ENCOUNTER — Encounter: Payer: Self-pay | Admitting: Surgical

## 2021-07-17 ENCOUNTER — Ambulatory Visit: Payer: Self-pay

## 2021-07-17 ENCOUNTER — Ambulatory Visit (INDEPENDENT_AMBULATORY_CARE_PROVIDER_SITE_OTHER): Payer: Medicare Other | Admitting: Surgical

## 2021-07-17 DIAGNOSIS — M5441 Lumbago with sciatica, right side: Secondary | ICD-10-CM | POA: Diagnosis not present

## 2021-07-17 DIAGNOSIS — M5442 Lumbago with sciatica, left side: Secondary | ICD-10-CM | POA: Diagnosis not present

## 2021-07-17 DIAGNOSIS — G8929 Other chronic pain: Secondary | ICD-10-CM | POA: Diagnosis not present

## 2021-07-17 NOTE — Progress Notes (Signed)
Office Visit Note   Patient: Kenneth [name]           Date of Birth: 09-07-1956           MRN: [id number] Visit Date: 07/17/2021 Requested by: Larey Dresser, MD [phone],  [place] [id number] PCP: [name], MD  Subjective: Chief Complaint  Patient presents with   Other     Back pain    HPI: Kenneth [name] is a 65 y.o. male who presents to the office complaining of mid and low back pain.  Patient is referred by Dr. Aundra Dubin.  He is his cardiologist and patient does have a significant medical history including type 2 diabetes which is well controlled with A1c 5.5, ESRD with dialysis Monday Wednesday Friday, significant cardiac history including CAD, CHF, multiple MIs.  He reports back pain over the last 15 years that has progressively worsened.  He localizes pain to the midline of the back in the lumbar and thoracic regions.  Most of the pain is in the lumbar spine.  He has never had surgery on the back.  He endorses radicular pain down both legs that travel from the low back through the buttocks down to the bottom of the foot on both sides.  Right and left radicular pain bothers him equally.  Main concern is back pain rather than the leg pain.  Pain wakes him up at night.  He has associated numbness and tingling down the leg.  Due to his back pain he has approximately 1 minute walking endurance before he has to stop.  He takes Tylenol for pain control and cannot take NSAIDs due to his medical history.  He does not take in the chronic opioid medication or see pain management.  He has had prior treatment by [facility] with MRI scan of the back 2 years ago at their facility.  He has had previous ESI's 6 times in the past.  Most recent injection was last year at [facility] but this injection did not yield any relief and actually made his symptoms worse.  The provider he was seen at that time recommended surgery but he has held off on this due to apprehension about having surgery.   He denies any urinary or bowel incontinence.  He does report saddle anesthesia but this has been present for 1 year and is not new for him.  Denies any pain from the thoracic spine around to his chest.              ROS: All systems reviewed are negative as they relate to the chief complaint within the history of present illness.  Patient denies fevers or chills.  Assessment & Plan: Visit Diagnoses:  1. Chronic bilateral low back pain with bilateral sciatica     Plan: Patient is a 65 year old male who presents for evaluation of lumbar and thoracic pain.  Most of his pain is in the lumbar spine.  He has associated radicular pain down both legs that travels from the back into the soles of both feet.  He has had back pain for 15 years but the radicular pain is new over the last year.  Main complaint for him is the severe back pain and diminished walking endurance.  He has significant weakness of pretty much every muscle in both lower legs.  He has no bowel or bladder incontinence but he does have numbness in saddle distribution though he states that this is been  ongoing for the last year so this is not an acute problem.  No appreciable reflexes on exam today.  Discussed options available to patient.  After discussion, patient would like to try physical therapy.  Given his severe weakness and degeneration on spine radiographs today, do not think that physical therapy will yield much relief but referral made.  Also plan to obtain urgent MRI of the lumbar and thoracic spines with referral for surgical consultation as well.  He has had somewhat recent MRI of the lumbar spine at [facility] that demonstrated severe spondylosis at multiple levels as well as moderate left and severe right foraminal stenosis at L5-S1 among other levels.  However, this prior MRI scan is almost 65 years old at this point and he has had significant progression of the radicular pain and weakness he is experiencing in the last 2 years  since his scan. Also plan for urgent referral to Neurosurgery for surgical consultation.    Follow-Up Instructions: No follow-ups on file.   Orders:  Orders Placed This Encounter  Procedures   XR Thoracic Spine 2 View   XR Lumbar Spine 2-3 Views   MR Lumbar Spine w/o contrast   MR Thoracic Spine w/o contrast   Ambulatory referral to Physical Therapy   No orders of the defined types were placed in this encounter.     Procedures: No procedures performed   Clinical Data: No additional findings.  Objective: Vital Signs: There were no vitals taken for this visit.  Physical Exam:  Constitutional: Patient appears well-developed HEENT:  Head: Normocephalic Eyes:EOM are normal Neck: Normal range of motion Cardiovascular: Normal rate Pulmonary/chest: Effort normal Neurologic: Patient is alert Skin: Skin is warm Psychiatric: Patient has normal mood and affect  Ortho Exam: Ortho exam demonstrates tenderness to palpation throughout the majority of the axial thoracic and lumbar spine.  Tenderness through paraspinal musculature of the thoracic and lumbar spine.  4/5 motor strength of bilateral hip flexion.  4/5 motor strength of bilateral quadricep and hamstring.  3/5 motor strength of bilateral dorsiflexion of the ankle and 4/5 motor strength of bilateral plantarflexion.  3-4/5 motor strength of EHL with amount of effort patient can give oscillating over the course of several seconds.  Patellar tendon reflexes rated 0 bilaterally.  Positive straight leg raise bilaterally.  Sensation diminished through the proximal medial thigh in the "saddle" region, lateral calf, medial calf, dorsum of the foot, plantar foot.  Sensation intact in the lateral thigh bilaterally.  No pain with hip range of motion.  Specialty Comments:  No specialty comments available.  Imaging: No results found.   PMFS History: Patient Active Problem List   Diagnosis Date Noted   Acute hypoxemic respiratory failure  ([place]) 01/01/2021   Prolonged QT interval 01/01/2021   Hemoptysis 01/01/2021   Occult blood in stools    Symptomatic bradycardia 12/15/2020   Postural dizziness with presyncope 12/15/2020   Blood in stool 03/07/7627   Acute metabolic encephalopathy 31/51/7616   Altered mental status 08/27/2019   Unresponsive episode 08/27/2019   AF (paroxysmal atrial fibrillation) ([place]) 08/27/2019   Status post coronary artery stent placement    NSTEMI (non-ST elevated myocardial infarction) ([place]) 03/30/2018   Hypertensive heart disease 09/12/2017   Hyperlipidemia 09/12/2017   Hyperkalemia 07/20/2017   Chronic diastolic CHF (congestive heart failure) ([place]) 04/14/2017   Hypertensive emergency 04/14/2017   Anemia, chronic renal failure 04/11/2017   Fluid overload 04/11/2017   Diabetic foot ulcer ([place]) 04/11/2017   Cellulitis in diabetic foot (  [place]) 01/18/2017   Chest pain 12/16/2016   Hypertensive urgency 12/16/2016   Pressure injury of skin 11/03/2016   Diabetes mellitus with complication ([employer]) 58/52/7782   ESRD (end stage renal disease) ([place]) 10/30/2016   CAD (coronary artery disease) 10/30/2016   Gastroesophageal reflux disease 08/18/2016   Non-ST elevation (NSTEMI) myocardial infarction [facility])    Pronation deformity of both feet 02/27/2015   Metatarsal deformity 02/27/2015   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   type 1 DM   ESRD on hemodialysis ([place])    "MWF; [facility]" (03/30/2018)   Gastroesophageal reflux disease 08/18/2016   HCAP  (healthcare-associated pneumonia)    Hyperlipidemia    Hypertension    Hypertensive heart disease 09/12/2017   Ischemic rest pain of lower extremity 02/06/2015   Keratoma 02/27/2015   Metatarsal deformity 02/27/2015   NSTEMI (non-ST elevated myocardial infarction) ([facility])    PAF (paroxysmal atrial fibrillation) ([facility])    Pleural effusion    Pronation deformity of both feet 02/27/2015    Family History  Problem Relation Age of Onset   CAD Father    Colon cancer Father    Diabetes Brother     Past Surgical History:  Procedure Laterality Date   A/V FISTULAGRAM Left 03/06/2021   Procedure: A/V FISTULAGRAM;  Surgeon: Marty Heck, MD;  Location: [place] [facility];  Service: Cardiovascular;  Laterality: Left;   AV FISTULA PLACEMENT Left 06/22/2016   Procedure: ARTERIOVENOUS (AV) FISTULA CREATION LEFT UPPER ARM;  Surgeon: Rosetta Posner, MD;  Location: [place];  Service: Vascular;  Laterality: Left;   BIOPSY  12/19/2020   Procedure: BIOPSY;  Surgeon: Ladene Artist, MD;  Location: [place];  Service: Endoscopy;;   CARDIAC CATHETERIZATION N/A 05/31/2016   Procedure: Left Heart Cath and Coronary Angiography;  Surgeon: Jettie Booze, MD;  Location: [place] [facility];  Service: Cardiovascular;  Laterality: N/A;   CARDIAC CATHETERIZATION N/A 05/31/2016   Procedure: Right Heart Cath;  Surgeon: Jettie Booze, MD;  Location: [place] [facility];  Service: Cardiovascular;  Laterality: N/A;   CARDIAC CATHETERIZATION N/A 05/31/2016   Procedure: IABP Insertion;  Surgeon: Jettie Booze, MD;  Location: [place] [facility];  Service: Cardiovascular;  Laterality: N/A;   COLONOSCOPY WITH PROPOFOL N/A 12/20/2020   Procedure: COLONOSCOPY WITH PROPOFOL;  Surgeon: Mauri Pole, MD;  Location: [facility];  Service: Endoscopy;  Laterality: N/A;   CORONARY ARTERY BYPASS GRAFT N/A 06/05/2016   Procedure: CORONARY ARTERY BYPASS GRAFTING (CABG) x3 LIMA to LAD -SVG to OM -SVG to RCA;  Surgeon:  Ivin Poot, MD;  Location: [place];  Service: Open Heart Surgery;  Laterality: N/A;   CORONARY STENT INTERVENTION N/A 07/07/2017   Procedure: CORONARY STENT INTERVENTION;  Surgeon: Wellington Hampshire, MD;  Location: [place] [facility];  Service: Cardiovascular;  Laterality: N/A;   CORONARY STENT INTERVENTION N/A 03/30/2018   Procedure: CORONARY STENT INTERVENTION;  Surgeon: Martinique, Peter M, MD;  Location: [place] [facility];  Service: Cardiovascular;  Laterality: N/A;   ESOPHAGOGASTRODUODENOSCOPY (EGD) WITH PROPOFOL N/A 12/19/2020   Procedure: ESOPHAGOGASTRODUODENOSCOPY (EGD) WITH PROPOFOL;  Surgeon: Ladene Artist, MD;  Location: [facility];  Service: Endoscopy;  Laterality: N/A;   GIVENS CAPSULE STUDY N/A 12/20/2020   Procedure: GIVENS CAPSULE STUDY;  Surgeon: Mauri Pole, MD;  Location: [place];  Service: Endoscopy;  Laterality: N/A;   INSERTION OF DIALYSIS CATHETER N/A 06/05/2016   Procedure: INSERTION OF DIALYSIS/trialysis CATHETER;  Surgeon: Ivin Poot, MD;  Location: [place];  Service: Vascular;  Laterality: N/A;   INSERTION OF DIALYSIS CATHETER Right 06/13/2016   Procedure: INSERTION OF DIALYSIS CATHETER RIGHT INTERNAL JUGULAR;  Surgeon: Conrad Danville, MD;  Location: [place];  Service: Vascular;  Laterality: Right;   INSERTION OF DIALYSIS CATHETER Right 09/07/2019   Procedure: Insertion Of Dialysis Catheter;  Surgeon: Rosetta Posner, MD;  Location: [facility] OR;  Service: Vascular;  Laterality: Right;   INTRAOPERATIVE TRANSESOPHAGEAL ECHOCARDIOGRAM N/A 06/05/2016   Procedure: INTRAOPERATIVE TRANSESOPHAGEAL ECHOCARDIOGRAM;  Surgeon: Ivin Poot, MD;  Location: [name];  Service: Open Heart Surgery;  Laterality: N/A;   LEFT HEART CATH AND CORS/GRAFTS ANGIOGRAPHY N/A 11/03/2016   Procedure: Left Heart Cath and Cors/Grafts Angiography;  Surgeon: Troy Sine, MD;  Location: [place] [facility];  Service: Cardiovascular;  Laterality: N/A;   LEFT HEART CATH AND CORS/GRAFTS ANGIOGRAPHY N/A  07/07/2017   Procedure: LEFT HEART CATH AND CORS/GRAFTS ANGIOGRAPHY;  Surgeon: Larey Dresser, MD;  Location: [place] [facility];  Service: Cardiovascular;  Laterality: N/A;   LEFT HEART CATH AND CORS/GRAFTS ANGIOGRAPHY N/A 03/30/2018   Procedure: LEFT HEART CATH AND CORS/GRAFTS ANGIOGRAPHY;  Surgeon: Martinique, Peter M, MD;  Location: [place] [facility];  Service: Cardiovascular;  Laterality: N/A;   LEFT HEART CATH AND CORS/GRAFTS ANGIOGRAPHY N/A 04/17/2019   Procedure: LEFT HEART CATH AND CORS/GRAFTS ANGIOGRAPHY;  Surgeon: Larey Dresser, MD;  Location: [place] [facility];  Service: Cardiovascular;  Laterality: N/A;   PERIPHERAL VASCULAR INTERVENTION Left 03/06/2021   Procedure: PERIPHERAL VASCULAR INTERVENTION;  Surgeon: Marty Heck, MD;  Location: [place] [facility];  Service: Cardiovascular;  Laterality: Left;   POLYPECTOMY  12/20/2020   Procedure: POLYPECTOMY;  Surgeon: Mauri Pole, MD;  Location: [place];  Service: Endoscopy;;   REVISION OF ARTERIOVENOUS GORETEX GRAFT Left 50/27/7412   Procedure: PLICATION OF ANEURYSM OF ARTERIOVENOUS FISTULA  LEFT ARM;  Surgeon: Rosetta Posner, MD;  Location: [place];  Service: Vascular;  Laterality: Left;   REVISON OF ARTERIOVENOUS FISTULA Left 09/07/2019   Procedure: REVISON OF LEFT UPPER ARM ARTERIOVENOUS FISTULA;  Surgeon: Rosetta Posner, MD;  Location: [place];  Service: Vascular;  Laterality: Left;   RIGHT/LEFT HEART CATH AND CORONARY ANGIOGRAPHY N/A 01/03/2021   Procedure: RIGHT/LEFT HEART CATH AND CORONARY ANGIOGRAPHY;  Surgeon: Larey Dresser, MD;  Location: [place] [facility];  Service: Cardiovascular;  Laterality: N/A;   Social History   Occupational History   Not on file  Tobacco Use   Smoking status: Former    Packs/day: 1.50    Years: 35.00    Pack years: 52.50    Types: Cigarettes    Quit date: 2014    Years since quitting: 8.8   Smokeless tobacco: Never   Tobacco comments:    "quit e-cigs ~ 2014"  Vaping Use    Vaping Use: Former  Substance and Sexual Activity   Alcohol use: Never    Alcohol/week: 0.0 standard drinks   Drug use:  Never   Sexual activity: Not on file

## 2021-07-21 ENCOUNTER — Other Ambulatory Visit: Payer: Self-pay

## 2021-07-21 DIAGNOSIS — G8929 Other chronic pain: Secondary | ICD-10-CM

## 2021-07-24 ENCOUNTER — Other Ambulatory Visit: Payer: Self-pay

## 2021-07-24 ENCOUNTER — Ambulatory Visit (HOSPITAL_COMMUNITY)
Admission: RE | Admit: 2021-07-24 | Discharge: 2021-07-24 | Disposition: A | Discharge status: Home / Self Care | Payer: Medicare Other | Source: Ambulatory Visit | Attending: Cardiology | Admitting: Cardiology

## 2021-07-24 DIAGNOSIS — N186 End stage renal disease: Secondary | ICD-10-CM | POA: Diagnosis not present

## 2021-07-24 DIAGNOSIS — I5032 Chronic diastolic (congestive) heart failure: Secondary | ICD-10-CM | POA: Insufficient documentation

## 2021-07-24 DIAGNOSIS — E1122 Type 2 diabetes mellitus with diabetic chronic kidney disease: Secondary | ICD-10-CM | POA: Insufficient documentation

## 2021-07-24 DIAGNOSIS — R0600 Dyspnea, unspecified: Secondary | ICD-10-CM | POA: Diagnosis not present

## 2021-07-24 DIAGNOSIS — M79606 Pain in leg, unspecified: Secondary | ICD-10-CM

## 2021-07-24 DIAGNOSIS — I082 Rheumatic disorders of both aortic and tricuspid valves: Secondary | ICD-10-CM | POA: Diagnosis not present

## 2021-07-24 DIAGNOSIS — I252 Old myocardial infarction: Secondary | ICD-10-CM | POA: Diagnosis not present

## 2021-07-24 DIAGNOSIS — R0609 Other forms of dyspnea: Secondary | ICD-10-CM

## 2021-07-24 DIAGNOSIS — I2510 Atherosclerotic heart disease of native coronary artery without angina pectoris: Secondary | ICD-10-CM | POA: Diagnosis not present

## 2021-07-24 DIAGNOSIS — I132 Hypertensive heart and chronic kidney disease with heart failure and with stage 5 chronic kidney disease, or end stage renal disease: Secondary | ICD-10-CM | POA: Insufficient documentation

## 2021-07-24 DIAGNOSIS — Z951 Presence of aortocoronary bypass graft: Secondary | ICD-10-CM | POA: Insufficient documentation

## 2021-07-24 DIAGNOSIS — I4891 Unspecified atrial fibrillation: Secondary | ICD-10-CM | POA: Insufficient documentation

## 2021-07-24 DIAGNOSIS — I998 Other disorder of circulatory system: Secondary | ICD-10-CM

## 2021-07-24 LAB — ECHOCARDIOGRAM COMPLETE
Area-P 1/2: 2.86 cm2
S' Lateral: 3.6 cm
Single Plane A4C EF: 60.2 %

## 2021-07-24 NOTE — Progress Notes (Signed)
  Echocardiogram 2D Echocardiogram has been performed.  Kenneth [name] 07/24/2021, 11:00 AM

## 2021-07-25 ENCOUNTER — Encounter: Payer: Self-pay | Admitting: *Deleted

## 2021-07-28 ENCOUNTER — Inpatient Hospital Stay (HOSPITAL_COMMUNITY): Payer: Medicare Other

## 2021-07-28 ENCOUNTER — Other Ambulatory Visit: Payer: Self-pay

## 2021-07-28 ENCOUNTER — Emergency Department (HOSPITAL_COMMUNITY): Payer: Medicare Other

## 2021-07-28 ENCOUNTER — Inpatient Hospital Stay (HOSPITAL_COMMUNITY)
Admission: EM | Admit: 2021-07-28 | Discharge: 2021-07-30 | DRG: 100 | Disposition: A | Discharge status: Home / Self Care | Payer: Medicare Other | Attending: Internal Medicine | Admitting: Internal Medicine

## 2021-07-28 ENCOUNTER — Encounter (HOSPITAL_COMMUNITY): Payer: Self-pay | Admitting: Internal Medicine

## 2021-07-28 DIAGNOSIS — Z7901 Long term (current) use of anticoagulants: Secondary | ICD-10-CM

## 2021-07-28 DIAGNOSIS — E1051 Type 1 diabetes mellitus with diabetic peripheral angiopathy without gangrene: Secondary | ICD-10-CM | POA: Diagnosis present

## 2021-07-28 DIAGNOSIS — I4729 Other ventricular tachycardia: Secondary | ICD-10-CM | POA: Diagnosis present

## 2021-07-28 DIAGNOSIS — X58XXXA Exposure to other specified factors, initial encounter: Secondary | ICD-10-CM | POA: Diagnosis present

## 2021-07-28 DIAGNOSIS — J9601 Acute respiratory failure with hypoxia: Secondary | ICD-10-CM | POA: Diagnosis present

## 2021-07-28 DIAGNOSIS — Z992 Dependence on renal dialysis: Secondary | ICD-10-CM | POA: Diagnosis not present

## 2021-07-28 DIAGNOSIS — K921 Melena: Secondary | ICD-10-CM | POA: Diagnosis present

## 2021-07-28 DIAGNOSIS — G40209 Localization-related (focal) (partial) symptomatic epilepsy and epileptic syndromes with complex partial seizures, not intractable, without status epilepticus: Secondary | ICD-10-CM | POA: Diagnosis not present

## 2021-07-28 DIAGNOSIS — G40109 Localization-related (focal) (partial) symptomatic epilepsy and epileptic syndromes with simple partial seizures, not intractable, without status epilepticus: Secondary | ICD-10-CM

## 2021-07-28 DIAGNOSIS — R410 Disorientation, unspecified: Secondary | ICD-10-CM | POA: Diagnosis not present

## 2021-07-28 DIAGNOSIS — Z20822 Contact with and (suspected) exposure to covid-19: Secondary | ICD-10-CM | POA: Diagnosis present

## 2021-07-28 DIAGNOSIS — Z800 Family history of malignant neoplasm of digestive organs: Secondary | ICD-10-CM

## 2021-07-28 DIAGNOSIS — Z955 Presence of coronary angioplasty implant and graft: Secondary | ICD-10-CM

## 2021-07-28 DIAGNOSIS — B5809 Other toxoplasma oculopathy: Secondary | ICD-10-CM | POA: Diagnosis present

## 2021-07-28 DIAGNOSIS — Z794 Long term (current) use of insulin: Secondary | ICD-10-CM

## 2021-07-28 DIAGNOSIS — D62 Acute posthemorrhagic anemia: Secondary | ICD-10-CM | POA: Diagnosis present

## 2021-07-28 DIAGNOSIS — D631 Anemia in chronic kidney disease: Secondary | ICD-10-CM | POA: Diagnosis present

## 2021-07-28 DIAGNOSIS — I248 Other forms of acute ischemic heart disease: Secondary | ICD-10-CM | POA: Diagnosis present

## 2021-07-28 DIAGNOSIS — Z8249 Family history of ischemic heart disease and other diseases of the circulatory system: Secondary | ICD-10-CM

## 2021-07-28 DIAGNOSIS — I480 Paroxysmal atrial fibrillation: Secondary | ICD-10-CM | POA: Diagnosis present

## 2021-07-28 DIAGNOSIS — E1022 Type 1 diabetes mellitus with diabetic chronic kidney disease: Secondary | ICD-10-CM | POA: Diagnosis present

## 2021-07-28 DIAGNOSIS — Z833 Family history of diabetes mellitus: Secondary | ICD-10-CM

## 2021-07-28 DIAGNOSIS — E785 Hyperlipidemia, unspecified: Secondary | ICD-10-CM | POA: Diagnosis present

## 2021-07-28 DIAGNOSIS — I452 Bifascicular block: Secondary | ICD-10-CM | POA: Diagnosis present

## 2021-07-28 DIAGNOSIS — Z9641 Presence of insulin pump (external) (internal): Secondary | ICD-10-CM | POA: Diagnosis present

## 2021-07-28 DIAGNOSIS — I120 Hypertensive chronic kidney disease with stage 5 chronic kidney disease or end stage renal disease: Secondary | ICD-10-CM | POA: Diagnosis not present

## 2021-07-28 DIAGNOSIS — I252 Old myocardial infarction: Secondary | ICD-10-CM

## 2021-07-28 DIAGNOSIS — R197 Diarrhea, unspecified: Secondary | ICD-10-CM | POA: Diagnosis present

## 2021-07-28 DIAGNOSIS — I255 Ischemic cardiomyopathy: Secondary | ICD-10-CM | POA: Diagnosis present

## 2021-07-28 DIAGNOSIS — N186 End stage renal disease: Secondary | ICD-10-CM | POA: Diagnosis present

## 2021-07-28 DIAGNOSIS — G40909 Epilepsy, unspecified, not intractable, without status epilepticus: Secondary | ICD-10-CM | POA: Diagnosis present

## 2021-07-28 DIAGNOSIS — I472 Ventricular tachycardia, unspecified: Secondary | ICD-10-CM

## 2021-07-28 DIAGNOSIS — I132 Hypertensive heart and chronic kidney disease with heart failure and with stage 5 chronic kidney disease, or end stage renal disease: Secondary | ICD-10-CM | POA: Diagnosis present

## 2021-07-28 DIAGNOSIS — T68XXXA Hypothermia, initial encounter: Secondary | ICD-10-CM | POA: Diagnosis present

## 2021-07-28 DIAGNOSIS — Z66 Do not resuscitate: Secondary | ICD-10-CM | POA: Diagnosis present

## 2021-07-28 DIAGNOSIS — R55 Syncope and collapse: Secondary | ICD-10-CM | POA: Diagnosis not present

## 2021-07-28 DIAGNOSIS — K644 Residual hemorrhoidal skin tags: Secondary | ICD-10-CM | POA: Diagnosis present

## 2021-07-28 DIAGNOSIS — E875 Hyperkalemia: Secondary | ICD-10-CM | POA: Diagnosis present

## 2021-07-28 DIAGNOSIS — J9600 Acute respiratory failure, unspecified whether with hypoxia or hypercapnia: Secondary | ICD-10-CM | POA: Diagnosis present

## 2021-07-28 DIAGNOSIS — D649 Anemia, unspecified: Secondary | ICD-10-CM | POA: Diagnosis not present

## 2021-07-28 DIAGNOSIS — R569 Unspecified convulsions: Secondary | ICD-10-CM | POA: Diagnosis not present

## 2021-07-28 DIAGNOSIS — I5033 Acute on chronic diastolic (congestive) heart failure: Secondary | ICD-10-CM | POA: Diagnosis present

## 2021-07-28 DIAGNOSIS — H1132 Conjunctival hemorrhage, left eye: Secondary | ICD-10-CM | POA: Diagnosis present

## 2021-07-28 DIAGNOSIS — Z227 Latent tuberculosis: Secondary | ICD-10-CM | POA: Diagnosis not present

## 2021-07-28 DIAGNOSIS — M898X9 Other specified disorders of bone, unspecified site: Secondary | ICD-10-CM | POA: Diagnosis present

## 2021-07-28 DIAGNOSIS — R32 Unspecified urinary incontinence: Secondary | ICD-10-CM | POA: Diagnosis present

## 2021-07-28 DIAGNOSIS — Z87891 Personal history of nicotine dependence: Secondary | ICD-10-CM

## 2021-07-28 DIAGNOSIS — R319 Hematuria, unspecified: Secondary | ICD-10-CM | POA: Diagnosis present

## 2021-07-28 LAB — BASIC METABOLIC PANEL
Anion gap: 11 (ref 5–15)
BUN: 18 mg/dL (ref 8–23)
CO2: 28 mmol/L (ref 22–32)
Calcium: 8.1 mg/dL — ABNORMAL LOW (ref 8.9–10.3)
Chloride: 97 mmol/L — ABNORMAL LOW (ref 98–111)
Creatinine, Ser: 5.02 mg/dL — ABNORMAL HIGH (ref 0.61–1.24)
GFR, Estimated: 12 mL/min — ABNORMAL LOW (ref >60)
Glucose, Bld: 210 mg/dL — ABNORMAL HIGH (ref 70–99)
Potassium: 5.2 mmol/L — ABNORMAL HIGH (ref 3.5–5.1)
Sodium: 136 mmol/L (ref 135–145)

## 2021-07-28 LAB — COMPREHENSIVE METABOLIC PANEL
ALT: 27 U/L (ref 0–44)
AST: 44 U/L — ABNORMAL HIGH (ref 15–41)
Albumin: 3.9 g/dL (ref 3.5–5.0)
Alkaline Phosphatase: 77 U/L (ref 38–126)
Anion gap: 16 — ABNORMAL HIGH (ref 5–15)
BUN: 56 mg/dL — ABNORMAL HIGH (ref 8–23)
CO2: 24 mmol/L (ref 22–32)
Calcium: 9.3 mg/dL (ref 8.9–10.3)
Chloride: 92 mmol/L — ABNORMAL LOW (ref 98–111)
Creatinine, Ser: 10.57 mg/dL — ABNORMAL HIGH (ref 0.61–1.24)
GFR, Estimated: 5 mL/min — ABNORMAL LOW (ref >60)
Glucose, Bld: 213 mg/dL — ABNORMAL HIGH (ref 70–99)
Potassium: >7.5 mmol/L (ref 3.5–5.1)
Sodium: 132 mmol/L — ABNORMAL LOW (ref 135–145)
Total Bilirubin: 0.6 mg/dL (ref 0.3–1.2)
Total Protein: 6.7 g/dL (ref 6.5–8.1)

## 2021-07-28 LAB — CBG MONITORING, ED
Glucose-Capillary: 189 mg/dL — ABNORMAL HIGH (ref 70–99)
Glucose-Capillary: 167 mg/dL — ABNORMAL HIGH (ref 70–99)
Glucose-Capillary: 146 mg/dL — ABNORMAL HIGH (ref 70–99)

## 2021-07-28 LAB — TROPONIN I (HIGH SENSITIVITY)
Troponin I (High Sensitivity): 583 ng/L (ref <18)
Troponin I (High Sensitivity): 492 ng/L (ref <18)

## 2021-07-28 LAB — CBC WITH DIFFERENTIAL/PLATELET
Abs Immature Granulocytes: 0.04 10*3/uL (ref 0.00–0.07)
Basophils Absolute: 0.1 10*3/uL (ref 0.0–0.1)
Basophils Relative: 1 %
Eosinophils Absolute: 0.1 10*3/uL (ref 0.0–0.5)
Eosinophils Relative: 1 %
HCT: 25.5 % — ABNORMAL LOW (ref 39.0–52.0)
Hemoglobin: 7.8 g/dL — ABNORMAL LOW (ref 13.0–17.0)
Immature Granulocytes: 1 %
Lymphocytes Relative: 14 %
Lymphs Abs: 1 10*3/uL (ref 0.7–4.0)
MCH: 30.5 pg (ref 26.0–34.0)
MCHC: 30.6 g/dL (ref 30.0–36.0)
MCV: 99.6 fL (ref 80.0–100.0)
Monocytes Absolute: 0.6 10*3/uL (ref 0.1–1.0)
Monocytes Relative: 8 %
Neutro Abs: 5.5 10*3/uL (ref 1.7–7.7)
Neutrophils Relative %: 75 %
Platelets: 210 10*3/uL (ref 150–400)
RBC: 2.56 MIL/uL — ABNORMAL LOW (ref 4.22–5.81)
RDW: 18 % — ABNORMAL HIGH (ref 11.5–15.5)
WBC: 7.3 10*3/uL (ref 4.0–10.5)
nRBC: 0 % (ref 0.0–0.2)

## 2021-07-28 LAB — BRAIN NATRIURETIC PEPTIDE
B Natriuretic Peptide: 1925.2 pg/mL — ABNORMAL HIGH (ref 0.0–100.0)
B Natriuretic Peptide: 2160.6 pg/mL — ABNORMAL HIGH (ref 0.0–100.0)

## 2021-07-28 LAB — MAGNESIUM: Magnesium: 3.1 mg/dL — ABNORMAL HIGH (ref 1.7–2.4)

## 2021-07-28 LAB — AMMONIA: Ammonia: <10 umol/L (ref 9–35)

## 2021-07-28 LAB — CBC
HCT: 22.9 % — ABNORMAL LOW (ref 39.0–52.0)
Hemoglobin: 7 g/dL — ABNORMAL LOW (ref 13.0–17.0)
MCH: 30.6 pg (ref 26.0–34.0)
MCHC: 30.6 g/dL (ref 30.0–36.0)
MCV: 100 fL (ref 80.0–100.0)
Platelets: 163 10*3/uL (ref 150–400)
RBC: 2.29 MIL/uL — ABNORMAL LOW (ref 4.22–5.81)
RDW: 18.5 % — ABNORMAL HIGH (ref 11.5–15.5)
WBC: 4.1 10*3/uL (ref 4.0–10.5)
nRBC: 0 % (ref 0.0–0.2)

## 2021-07-28 LAB — RESP PANEL BY RT-PCR (FLU A&B, COVID) ARPGX2
Influenza A by PCR: NEGATIVE
Influenza B by PCR: NEGATIVE
SARS Coronavirus 2 by RT PCR: NEGATIVE

## 2021-07-28 LAB — CREATININE, SERUM
Creatinine, Ser: 4.83 mg/dL — ABNORMAL HIGH (ref 0.61–1.24)
GFR, Estimated: 13 mL/min — ABNORMAL LOW (ref >60)

## 2021-07-28 LAB — PREPARE RBC (CROSSMATCH)

## 2021-07-28 LAB — D-DIMER, QUANTITATIVE: D-Dimer, Quant: 0.34 ug/mL-FEU (ref 0.00–0.50)

## 2021-07-28 LAB — LACTIC ACID, PLASMA
Lactic Acid, Venous: 1.7 mmol/L (ref 0.5–1.9)
Lactic Acid, Venous: 1.7 mmol/L (ref 0.5–1.9)

## 2021-07-28 LAB — HEPATITIS B SURFACE ANTIGEN: Hepatitis B Surface Ag: NONREACTIVE

## 2021-07-28 LAB — HEPATITIS B SURFACE ANTIBODY,QUALITATIVE: Hep B S Ab: REACTIVE — AB

## 2021-07-28 MED ORDER — VANCOMYCIN VARIABLE DOSE PER UNSTABLE RENAL FUNCTION (PHARMACIST DOSING): Status: DC

## 2021-07-28 MED ORDER — ALBUTEROL SULFATE (2.5 MG/3ML) 0.083% IN NEBU
10.0000 mg | INHALATION_SOLUTION | Freq: Once | RESPIRATORY_TRACT | Status: AC
  Administered 2021-07-28: 10 mg via RESPIRATORY_TRACT
  Filled 2021-07-28: qty 12

## 2021-07-28 MED ORDER — LORAZEPAM 2 MG/ML IJ SOLN: 2.0000 mg | INTRAMUSCULAR | Status: DC | PRN

## 2021-07-28 MED ORDER — FUROSEMIDE 10 MG/ML IJ SOLN
40.0000 mg | Freq: Once | INTRAMUSCULAR | Status: AC
  Administered 2021-07-28: 40 mg via INTRAVENOUS
  Filled 2021-07-28: qty 4

## 2021-07-28 MED ORDER — CINACALCET HCL 30 MG PO TABS
60.0000 mg | ORAL_TABLET | ORAL | Status: DC
  Administered 2021-07-30: 60 mg via ORAL
  Filled 2021-07-28: qty 2

## 2021-07-28 MED ORDER — CHLORHEXIDINE GLUCONATE CLOTH 2 % EX PADS
6.0000 | MEDICATED_PAD | Freq: Every day | CUTANEOUS | Status: DC
  Administered 2021-07-30: 6 via TOPICAL

## 2021-07-28 MED ORDER — HEPARIN SODIUM (PORCINE) 5000 UNIT/ML IJ SOLN: 5000.0000 [IU] | Freq: Three times a day (TID) | INTRAMUSCULAR | Status: DC

## 2021-07-28 MED ORDER — DEXTROSE 50 % IV SOLN
1.0000 | Freq: Once | INTRAVENOUS | Status: AC
  Administered 2021-07-28: 50 mL via INTRAVENOUS
  Filled 2021-07-28: qty 50

## 2021-07-28 MED ORDER — VANCOMYCIN HCL 1500 MG/300ML IV SOLN
1500.0000 mg | Freq: Once | INTRAVENOUS | Status: AC
  Administered 2021-07-28: 1500 mg via INTRAVENOUS
  Filled 2021-07-28: qty 300

## 2021-07-28 MED ORDER — SODIUM CHLORIDE 0.9 % IV SOLN
1.0000 g | INTRAVENOUS | Status: DC
  Administered 2021-07-28: 1 g via INTRAVENOUS
  Filled 2021-07-28 (×2): qty 1

## 2021-07-28 MED ORDER — SODIUM CHLORIDE 0.9 % IV BOLUS
500.0000 mL | Freq: Once | INTRAVENOUS | Status: AC
  Administered 2021-07-28: 500 mL via INTRAVENOUS

## 2021-07-28 MED ORDER — SODIUM CHLORIDE 0.9 % IV SOLN
2.0000 g | Freq: Two times a day (BID) | INTRAVENOUS | Status: DC
  Administered 2021-07-28 – 2021-07-29 (×2): 2 g via INTRAVENOUS
  Filled 2021-07-28 (×4): qty 2000

## 2021-07-28 MED ORDER — DOXERCALCIFEROL 4 MCG/2ML IV SOLN
2.0000 ug | INTRAVENOUS | Status: DC
  Administered 2021-07-30: 2 ug via INTRAVENOUS
  Filled 2021-07-28: qty 2

## 2021-07-28 MED ORDER — LEVETIRACETAM IN NACL 1000 MG/100ML IV SOLN
1000.0000 mg | Freq: Once | INTRAVENOUS | Status: AC
  Administered 2021-07-28: 1000 mg via INTRAVENOUS
  Filled 2021-07-28: qty 100

## 2021-07-28 MED ORDER — CALCIUM GLUCONATE-NACL 1-0.675 GM/50ML-% IV SOLN
1.0000 g | Freq: Once | INTRAVENOUS | Status: AC
  Administered 2021-07-28: 1000 mg via INTRAVENOUS
  Filled 2021-07-28: qty 50

## 2021-07-28 MED ORDER — LEVETIRACETAM 500 MG PO TABS
500.0000 mg | ORAL_TABLET | Freq: Every day | ORAL | Status: DC
  Administered 2021-07-29 – 2021-07-30 (×2): 500 mg via ORAL
  Filled 2021-07-28 (×2): qty 1

## 2021-07-28 MED ORDER — LEVETIRACETAM 250 MG PO TABS: 250.0000 mg | ORAL_TABLET | ORAL | Status: DC

## 2021-07-28 MED ORDER — SODIUM CHLORIDE 0.9% IV SOLUTION: Freq: Once | INTRAVENOUS | Status: AC

## 2021-07-28 MED ORDER — INSULIN ASPART 100 UNIT/ML IV SOLN
5.0000 [IU] | Freq: Once | INTRAVENOUS | Status: AC
  Administered 2021-07-28: 5 [IU] via INTRAVENOUS

## 2021-07-28 MED ORDER — INSULIN ASPART 100 UNIT/ML IJ SOLN
0.0000 [IU] | INTRAMUSCULAR | Status: DC
  Administered 2021-07-28 – 2021-07-29 (×3): 1 [IU] via SUBCUTANEOUS

## 2021-07-28 MED ORDER — SODIUM BICARBONATE 8.4 % IV SOLN
50.0000 meq | Freq: Once | INTRAVENOUS | Status: AC
  Administered 2021-07-28: 50 meq via INTRAVENOUS
  Filled 2021-07-28: qty 50

## 2021-07-28 MED ORDER — SODIUM ZIRCONIUM CYCLOSILICATE 10 G PO PACK
10.0000 g | PACK | Freq: Once | ORAL | Status: AC
  Administered 2021-07-28: 10 g via ORAL
  Filled 2021-07-28: qty 1

## 2021-07-28 NOTE — Consult Note (Addendum)
Advanced Heart Failure Team Consult Note   Primary Physician: [name], MD PCP-Cardiologist:  Dr. Aundra Dubin   Reason for Consultation: Elevated HS trop and A/c Diastolic Heart Failure   HPI:    Kenneth [name] is seen today for evaluation of elevated HS trop and acute on chronic diastolic heart failure at the request of Dr. Philipp Ovens, Internal Medicine.   65 y.o. 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  He was admitted in 9/17 with NSTEMI.  LHC showed 3VD and he had CABG x 3.  He had CKD stage IV when he was admitted and developed ESRD requiring HD while in the hospital.  He had transient atrial fibrillation post-CABG.  He developed cardiogenic shock requiring milrinone but was able to taper off.  Echo in 9/17 showed EF 35-40%.     Admitted 8/1 through 04/16/2017 with malignant hypertension due to medication noncompliance.  Discharge weight 140 pounds.    Admitted 10/18 with chest pain and pulmonary edema. NSTEMI noted on admit. Echo showed preserved EF but + WMAs. Cath with 95% ostial SVG-OM1 stenosis with TIMI 2 flow in graft.  Patient had DES to SVG-OM1.    Admitted in 12/18 with atypical chest pain, suspect not cardiac.    NSTEMI in 7/19 with severe in-stent restenosis in SVG-OM1, had DES x 2 to native OM1.  Echo showed EF 50%.  Atrial fibrillation was noted this admission.    He has had a chronic pattern of atypical chest pain.  Cardiolite in 3/20 showed EF 54%, small fixed inferior defect with no ischemia.    He was admitted in 8/20 with severe episode of chest pain.  Hs-TnI was mildly elevated without trend.  Cath was done, showing no target for PCI and stable disease. Echo was done showing EF 50-55% with moderate LVH and normal RV.     He was admitted in 4/22 with CHF and chest pain, elevated HS-TnI to 1673.  Volume overloaded treated with daily HD in hospital.  He had RHC/LHC done after he had been  dialyzed x 2, showing normal filling pressures and unchanged coronary anatomy.  Echo showed EF 55-60%, RV normal.   Most recent echo 11/22 EF 60-65%, GIIDD, RV normal.    Pt was at HD today and became unresponsive w/ ? Seizure like activity. EMS activated and brought into the ED. Head CT negative for acute abnormality. K >7.5, SCr 10.57, CO2 24, Na 132, Hs trop 583>>492, Glucose 213, WBC 7.3, Hgb 7.8. EKG showed peaked T-waves, SR 85 bpm w/ RBBB and LPFB, prolonged QT/QTcB 527/627 ms. Given Loklema 10 g + calcium gluconate + insulin. Also w/ fluid overload. BNP 1,925, though CXR showed no frank edema. Given 40 IV Lasix. Inpatient Nephrology consulted and started urgent HD.   Pt examined in HD. Reports feeling "much better". Felt dizzy and weak earlier this am. Has been more SOB. Continues w/ his usual atypical chest pain. Often sharp and short lived/self limiting, lasting <1 min at a time. No significant CP earlier today. Per his report, has not missed any HD sessions. Per record, his daily Lokelma was recently stopped 2-3 weeks ago after labs showed K+ as under control.     Review of Systems: [y] = yes, [ ]  = no   General: Weight gain [ ] ; Weight loss [ ] ; Anorexia [ ] ; Fatigue [Y ]; Fever [ ] ; Chills [ ] ; Weakness [  Y ]  Cardiac: Chest pain/pressure [ ] ; Resting SOB [Y ]; Exertional SOB [Y ]; Orthopnea [ ] ; Pedal Edema [ ] ; Palpitations [ ] ; Syncope [ Y]; Presyncope [ Y]; Paroxysmal nocturnal dyspnea[ ]   Pulmonary: Cough [ ] ; Wheezing[ ] ; Hemoptysis[ ] ; Sputum [ ] ; Snoring [ ]   GI: Vomiting[ ] ; Dysphagia[ ] ; Melena[ ] ; Hematochezia [ ] ; Heartburn[ ] ; Abdominal pain [ ] ; Constipation [ ] ; Diarrhea [ ] ; BRBPR [ ]   GU: Hematuria[ ] ; Dysuria [ ] ; Nocturia[ ]   Vascular: Pain in legs with walking [ ] ; Pain in feet with lying flat [ ] ; Non-healing sores [ ] ; Stroke [ ] ; TIA [ ] ; Slurred speech [ ] ;  Neuro: Headaches[ ] ; Vertigo[ ] ; Seizures[ ?Y]; Paresthesias[ ] ;Blurred vision [ ] ; Diplopia [ ] ; Vision  changes [ ]   Ortho/Skin: Arthritis [ ] ; Joint pain [ ] ; Muscle pain [ ] ; Joint swelling [ ] ; Back Pain [ ] ; Rash [ ]   Psych: Depression[ ] ; Anxiety[ ]   Heme: Bleeding problems [ ] ; Clotting disorders [ ] ; Anemia [ ]   Endocrine: Diabetes [ Y]; Thyroid dysfunction[ ]   Home Medications Prior to Admission medications   Medication Sig Start Date End Date Taking? Authorizing Provider  acetaminophen (TYLENOL) 500 MG tablet Take 1,000 mg by mouth every 6 (six) hours as needed for mild pain or headache.     [provider]  apixaban (ELIQUIS) 5 MG TABS tablet Take 1 tablet (5 mg total) by mouth 2 (two) times daily. 01/28/21   Larey Dresser, MD  Ascorbic Acid (VITAMIN C) 1000 MG tablet Take 1,000 mg by mouth daily.    [provider]  atorvastatin (LIPITOR) 80 MG tablet Take 1 tablet by mouth once daily 07/08/21   Larey Dresser, MD  calcium acetate (PHOSLO) 667 MG capsule Take 667 mg by mouth 3 (three) times daily with meals.  07/09/17   [provider]  citalopram (CELEXA) 20 MG tablet Take 20 mg by mouth daily with lunch.  02/25/17   [provider]  ezetimibe (ZETIA) 10 MG tablet Take 10 mg by mouth daily.    [provider]  gabapentin (NEURONTIN) 100 MG capsule Take 1 capsule (100 mg total) by mouth at bedtime. 01/04/21   Geradine Girt, DO  glucose blood (CONTOUR NEXT TEST) test strip Test BS 5 times a day 03/04/21   [provider]  hydrALAZINE (APRESOLINE) 100 MG tablet Take 1 tablet (100 mg total) by mouth 3 (three) times daily. DO NOT TAKE AM AND NOON DOSES ON HD DAYS 07/10/21   Larey Dresser, MD  insulin glargine, 2 Unit Dial, (TOUJEO MAX SOLOSTAR) 300 UNIT/ML Solostar Pen Inject into the skin. 04/02/21   [provider]  Insulin Human (INSULIN PUMP) SOLN Inject into the skin continuous. NOVOLOG    [provider]  isosorbide mononitrate (IMDUR) 60 MG 24 hr tablet Take 1 tablet (60 mg total) by mouth daily. DO NOT TAKE  ON HD DAYS 07/10/21   Larey Dresser, MD  losartan (COZAAR) 25 MG tablet TAKE 1 TABLET BY MOUTH AT BEDTIME 07/08/21   Larey Dresser, MD  Methoxy PEG-Epoetin Beta (MIRCERA IJ) Mircera 03/17/21 03/16/22  [provider]  NOVOLOG 100 UNIT/ML injection Inject 6-10 Units into the skin See admin instructions. Starts with 10 units per day and adds 6 units with each meal via pump 01/08/19   [provider]  pantoprazole (PROTONIX) 40 MG tablet Take 40 mg by mouth daily before lunch.  01/07/17   [provider]  sodium zirconium cyclosilicate (LOKELMA) 10 g PACK packet Take 10 g by mouth See admin instructions. Takes on Tues, Thur, Sat only.    [provider]  vitamin B-12 (CYANOCOBALAMIN) 500 MCG tablet Take 1 tablet (500 mcg total) by mouth daily. 01/05/21   Geradine Girt, DO    Past Medical History: Past Medical History:  Diagnosis Date   Acute respiratory failure with hypoxemia ([place]) 05/31/2016   CAD (coronary artery disease) 10/30/2016   NSTEMI 9/17 with CABG x 3 (LIMA-LAD, SVG-OM, SVG-RCA).   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   type 1 DM   ESRD on hemodialysis ([place])    "MWF; [facility]" (03/30/2018)   Gastroesophageal reflux disease 08/18/2016   HCAP (healthcare-associated pneumonia)    Hyperlipidemia    Hypertension    Hypertensive heart disease 09/12/2017   Ischemic rest pain of lower extremity 02/06/2015   Keratoma 02/27/2015   Metatarsal deformity 02/27/2015   NSTEMI (non-ST elevated myocardial infarction) ([facility])    PAF (paroxysmal atrial fibrillation) ([facility])    Pleural effusion    Pronation deformity of both feet 02/27/2015    Past Surgical History: Past Surgical History:  Procedure Laterality Date   A/V FISTULAGRAM Left 03/06/2021   Procedure: A/V FISTULAGRAM;  Surgeon: Marty Heck, MD;  Location:  [facility];  Service: Cardiovascular;  Laterality: Left;   AV FISTULA PLACEMENT Left 06/22/2016   Procedure: ARTERIOVENOUS (AV) FISTULA CREATION LEFT UPPER ARM;  Surgeon: Rosetta Posner, MD;  Location: [place];  Service: Vascular;  Laterality: Left;   BIOPSY  12/19/2020   Procedure: BIOPSY;  Surgeon: Ladene Artist, MD;  Location: [place] [facility];  Service: Endoscopy;;   CARDIAC CATHETERIZATION N/A 05/31/2016   Procedure: Left Heart Cath and Coronary Angiography;  Surgeon: Jettie Booze, MD;  Location: [facility];  Service: Cardiovascular;  Laterality: N/A;   CARDIAC CATHETERIZATION N/A 05/31/2016   Procedure: Right Heart Cath;  Surgeon: Jettie Booze, MD;  Location: [place] [facility];  Service: Cardiovascular;  Laterality: N/A;   CARDIAC CATHETERIZATION N/A 05/31/2016   Procedure: IABP Insertion;  Surgeon: Jettie Booze, MD;  Location: [place];  Service: Cardiovascular;  Laterality: N/A;   COLONOSCOPY WITH PROPOFOL N/A 12/20/2020   Procedure: COLONOSCOPY WITH PROPOFOL;  Surgeon: Mauri Pole, MD;  Location: [facility];  Service: Endoscopy;  Laterality: N/A;   CORONARY ARTERY BYPASS GRAFT N/A 06/05/2016   Procedure: CORONARY ARTERY BYPASS GRAFTING (CABG) x3 LIMA to LAD -SVG to OM -SVG to RCA;  Surgeon: Ivin Poot, MD;  Location: [place];  Service: Open Heart Surgery;  Laterality: N/A;   CORONARY STENT INTERVENTION N/A 07/07/2017   Procedure: CORONARY STENT INTERVENTION;  Surgeon: Wellington Hampshire, MD;  Location: [facility];  Service: Cardiovascular;  Laterality: N/A;   CORONARY STENT INTERVENTION N/A 03/30/2018   Procedure: CORONARY STENT INTERVENTION;  Surgeon: Martinique, Peter M, MD;  Location: [facility];  Service: Cardiovascular;  Laterality: N/A;   ESOPHAGOGASTRODUODENOSCOPY (EGD) WITH PROPOFOL N/A 12/19/2020   Procedure: ESOPHAGOGASTRODUODENOSCOPY (EGD) WITH PROPOFOL;  Surgeon: Ladene Artist, MD;  Location: [facility];  Service:  Endoscopy;  Laterality: N/A;   GIVENS CAPSULE STUDY N/A 12/20/2020   Procedure: GIVENS CAPSULE STUDY;  Surgeon: Silverio Decamp,  Venia Minks, MD;  Location: [facility];  Service: Endoscopy;  Laterality: N/A;   INSERTION OF DIALYSIS CATHETER N/A 06/05/2016   Procedure: INSERTION OF DIALYSIS/trialysis CATHETER;  Surgeon: Ivin Poot, MD;  Location: [place] [facility];  Service: Vascular;  Laterality: N/A;   INSERTION OF DIALYSIS CATHETER Right 06/13/2016   Procedure: INSERTION OF DIALYSIS CATHETER RIGHT INTERNAL JUGULAR;  Surgeon: Conrad Shell Rock, MD;  Location: [facility];  Service: Vascular;  Laterality: Right;   INSERTION OF DIALYSIS CATHETER Right 09/07/2019   Procedure: Insertion Of Dialysis Catheter;  Surgeon: Rosetta Posner, MD;  [facility]: [facility];  Service: Vascular;  Laterality: Right;   INTRAOPERATIVE TRANSESOPHAGEAL ECHOCARDIOGRAM N/A 06/05/2016   Procedure: INTRAOPERATIVE TRANSESOPHAGEAL ECHOCARDIOGRAM;  Surgeon: Ivin Poot, MD;  Location: [place];  Service: Open Heart Surgery;  Laterality: N/A;   LEFT HEART CATH AND CORS/GRAFTS ANGIOGRAPHY N/A 11/03/2016   Procedure: Left Heart Cath and Cors/Grafts Angiography;  Surgeon: Troy Sine, MD;  Location: [facility] [facility];  Service: Cardiovascular;  Laterality: N/A;   LEFT HEART CATH AND CORS/GRAFTS ANGIOGRAPHY N/A 07/07/2017   Procedure: LEFT HEART CATH AND CORS/GRAFTS ANGIOGRAPHY;  Surgeon: Larey Dresser, MD;  Location: [place] [facility];  Service: Cardiovascular;  Laterality: N/A;   LEFT HEART CATH AND CORS/GRAFTS ANGIOGRAPHY N/A 03/30/2018   Procedure: LEFT HEART CATH AND CORS/GRAFTS ANGIOGRAPHY;  Surgeon: Martinique, Peter M, MD;  Location: [place] [facility];  Service: Cardiovascular;  Laterality: N/A;   LEFT HEART CATH AND CORS/GRAFTS ANGIOGRAPHY N/A 04/17/2019   Procedure: LEFT HEART CATH AND CORS/GRAFTS ANGIOGRAPHY;  Surgeon: Larey Dresser, MD;  Location: [place] [facility];  Service: Cardiovascular;  Laterality: N/A;   PERIPHERAL VASCULAR INTERVENTION Left  03/06/2021   Procedure: PERIPHERAL VASCULAR INTERVENTION;  Surgeon: Marty Heck, MD;  Location: [place] [facility];  Service: Cardiovascular;  Laterality: Left;   POLYPECTOMY  12/20/2020   Procedure: POLYPECTOMY;  Surgeon: Mauri Pole, MD;  Location: [place];  Service: Endoscopy;;   REVISION OF ARTERIOVENOUS GORETEX GRAFT Left 00/86/7619   Procedure: PLICATION OF ANEURYSM OF ARTERIOVENOUS FISTULA  LEFT ARM;  Surgeon: Rosetta Posner, MD;  Location: [place];  Service: Vascular;  Laterality: Left;   REVISON OF ARTERIOVENOUS FISTULA Left 09/07/2019   Procedure: REVISON OF LEFT UPPER ARM ARTERIOVENOUS FISTULA;  Surgeon: Rosetta Posner, MD;  Location: [place];  Service: Vascular;  Laterality: Left;   RIGHT/LEFT HEART CATH AND CORONARY ANGIOGRAPHY N/A 01/03/2021   Procedure: RIGHT/LEFT HEART CATH AND CORONARY ANGIOGRAPHY;  Surgeon: Larey Dresser, MD;  Location: [place] [facility];  Service: Cardiovascular;  Laterality: N/A;    Family History: Family History  Problem Relation Age of Onset   CAD Father    Colon cancer Father    Diabetes Brother     Social History: Social History   Socioeconomic History   Marital status: Married    Spouse name: Not on file   Number of children: Not on file   Years of education: Not on file   Highest education level: Not on file  Occupational History   Not on file  Tobacco Use   Smoking status: Former    Packs/day: 1.50    Years: 35.00    Pack years: 52.50    Types: Cigarettes    Quit date: 2014    Years since quitting: 8.8   Smokeless tobacco: Never   Tobacco comments:    "quit e-cigs ~ 2014"  Vaping Use   Vaping Use:  Former  Substance and Sexual Activity   Alcohol use: Never    Alcohol/week: 0.0 standard drinks   Drug use: Never   Sexual activity: Not on file  Other Topics Concern   Not on file  Social History Narrative   Not on file   Social Determinants of Health   Financial Resource Strain: Not on file  Food  Insecurity: Not on file  Transportation Needs: Not on file  Physical Activity: Not on file  Stress: Not on file  Social Connections: Not on file    Allergies:  No Known Allergies  Objective:    Vital Signs:   Temp:  [95.7 F (35.4 C)-98.1 F (36.7 C)] 98.1 F (36.7 C) (11/14 1015) Pulse Rate:  [58-90] 75 (11/14 1145) Resp:  [12-23] 13 (11/14 1145) BP: (139-181)/(55-88) 145/55 (11/14 1145) SpO2:  [97 %-100 %] 100 % (11/14 1145)    Weight change: There were no vitals filed for this visit.  Intake/Output:   Intake/Output Summary (Last 24 hours) at 07/28/2021 1159 Last data filed at 07/28/2021 1034 Gross per 24 hour  Intake 550 ml  Output --  Net 550 ml      Physical Exam    General:  fatigue appearing, currently getting HD No resp difficulty HEENT: normal Neck: supple. JVP 9 cm. Carotids 2+ bilat; no bruits. No lymphadenopathy or thyromegaly appreciated. Cor: PMI nondisplaced. Regular rate & rhythm. No rubs, gallops or murmurs. Lungs: clear Abdomen: soft, nontender, nondistended. No hepatosplenomegaly. No bruits or masses. Good bowel sounds. Extremities: no cyanosis, clubbing, rash, edema Neuro: alert & orientedx3, cranial nerves grossly intact. moves all 4 extremities w/o difficulty. Affect pleasant   Telemetry   N/A   EKG    peaked T-waves (hyperkalemia), SR 85 bpm w/ RBBB and LPFB, prolonged QT/QTcB 527/627 ms  Labs   Basic Metabolic Panel: Recent Labs  Lab 07/28/21 0724  NA 132*  K >7.5*  CL 92*  CO2 24  GLUCOSE 213*  BUN 56*  CREATININE 10.57*  CALCIUM 9.3  MG 3.1*    Liver Function Tests: Recent Labs  Lab 07/28/21 0724  AST 44*  ALT 27  ALKPHOS 77  BILITOT 0.6  PROT 6.7  ALBUMIN 3.9   No results for input(s): LIPASE, AMYLASE in the last 168 hours. No results for input(s): AMMONIA in the last 168 hours.  CBC: Recent Labs  Lab 07/28/21 0724  WBC 7.3  NEUTROABS 5.5  HGB 7.8*  HCT 25.5*  MCV 99.6  PLT 210    Cardiac  Enzymes: No results for input(s): CKTOTAL, CKMB, CKMBINDEX, TROPONINI in the last 168 hours.  BNP: BNP (last 3 results) Recent Labs    12/16/20 0406 01/01/21 1004 07/28/21 0724  BNP 1,991.6* 3,450.4* 1,925.2*    ProBNP (last 3 results) No results for input(s): PROBNP in the last 8760 hours.   CBG: Recent Labs  Lab 07/28/21 0720 07/28/21 0947  GLUCAP 189* 167*    Coagulation Studies: No results for input(s): LABPROT, INR in the last 72 hours.   Imaging   CT Head Wo Contrast  Result Date: 07/28/2021 CLINICAL DATA:  Seizure, nontraumatic (Age >= 14y) Mental status change, unknown cause EXAM: CT HEAD WITHOUT CONTRAST TECHNIQUE: Contiguous axial images were obtained from the base of the skull through the vertex without intravenous contrast. COMPARISON:  12/22/2020 FINDINGS: Brain: There is no acute intracranial hemorrhage, mass effect, or edema. Gray-white differentiation is preserved. There is no extra-axial fluid collection. Ventricles and sulci are stable in size and configuration.  Patchy hypoattenuation in the supratentorial white matter is nonspecific but probably reflects stable chronic microvascular ischemic changes. 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 Vascular: There is atherosclerotic calcification at the skull base. Skull: Calvarium is unremarkable. Sinuses/Orbits: No acute finding. Other: None. IMPRESSION: No acute intracranial abnormality. Stable chronic findings detailed above. Electronically Signed   By: Macy Mis M.D.   On: 07/28/2021 08:16   DG Chest Port 1 View  Result Date: 07/28/2021 CLINICAL DATA:  Fatigue and dialysis.  Chest pain EXAM: PORTABLE CHEST 1 VIEW COMPARISON:  01/01/2021 FINDINGS: Cardiomegaly.  CABG.  Stable aortic and hilar contours. Artifact from EKG leads. There is no edema, consolidation, effusion, or pneumothorax. Left subclavian stenting IMPRESSION: No evidence of acute disease. Chronic  cardiomegaly. Electronically Signed   By: Jorje Guild M.D.   On: 07/28/2021 07:51     Medications:     Current Medications:  Chlorhexidine Gluconate Cloth  6 each Topical Q0600    Infusions:     Assessment/Plan   Hyperkalemia - K >7.5, EKG w/ peaked T-waves and prolonged QT/QTc 527/627 ms - Lokelma 10 g + Calcium gluconate + insulin  - repeat BMP  - continuous telemetry monitoring  - HD per nephrology - Recheck BMP post HD  - Will likely need to restart scheduled lokelma  2. Acute on Chronic Diastolic CHF: EF low in past but improved after CABG. Most recent echo 11/22 with EF 60-65%, GIIDD, RV normal. With ESRD, volume managed through HD. Missed HD session today. BNP 1,925. CXR no frank edema.  - resume HD schedule per nephrology - may need to lower EDW goal   3. CAD w/ Elevated HS Troponin: s/p CABG.  NSTEMI 10/18, cath with 95% ostial SVG-high OM1 stenosis with TIMI 2 flow in graft, s/p DES to SVG-OM1 07/07/17. NSTEMI 7/19 with in-stent restenosis in the SVG-ramus, had DES x 2 to native high OM1.   He has had chronic atypical chest pain episodes, Cardiolite in 3/20 showed no ischemia.  He had repeat cath in 8/20 showing occluded SVG-ramus (new) but continued patency of the native high OM1 s/p stenting.  No target for intervention.  LHC again in 4/22, no change from 8/20.  No intervention.  Chest pain/elevated troponin thought to be due to volume overload/demand ischemia. He continues to have episodes of atypical chest pain, suspect nonischemic.  HS trop elevated on this admit 583>>492 (1,600 when cathed 4/22). Suspect demand ischemia  - He will continue Eliquis so not on ASA.  - Continue Imdur 60 mg daily.  - Continue atorvastatin 80 mg daily.   - Cycle trop x 3. If continued down trend, no plans to repeat cath  4. Hyperlipidemia: Lipids good 10/22. LDL 48  - Continue atorvastatin and Zetia.  5. Atrial fibrillation: Paroxysmal, NSR recently.  - Continue Eliquis 5 mg bid.   6. HTN: Moderately elevated on admit, 140s-180s. He gets lightheaded with BP drop at dialysis.  - Holds first 2 doses of hydralazine on HD days.   - Continue qhs losartan.  7. ESRD: HD MWF. Management per nephrology. As above, needs dry weight lowered based on NYHA class III symptoms    8. Type 2 diabetes: Controlled. Last Hgb A1c 5.9.  9. ? Seizure: in the setting of severe hyperkalemia. Head CT negative.  - defer Neurology consult/ EEG to primary team      Length of Stay: 0  Lyda Jester, PA-C  07/28/2021, 11:59 AM  Advanced Heart  Failure Team Pager [phone] (M-F; 7a - 5p)  Please contact [facility] for night-coverage after hours (4p -7a ) and weekends on [email or web address]   Patient seen and examined with the above-signed Advanced Practice Provider and/or Housestaff. I personally reviewed laboratory data, imaging studies and relevant notes. I independently examined the patient and formulated the important aspects of the plan. I have edited the note to reflect any of my changes or salient points. I have personally discussed the plan with the patient and/or family.  65 y/o male with ESRD, systolic HF with recovered EF, CAD s/p CABG and PAF.   Brought to ER from HD center for loss of consciousness with possible seizure like activity in setting of K > 7.5  In ER, initial ECG with sinus rhythm with new bifascicular block (RBBB, LPFB) with marked QRS widening. Treated with IV insulin, lokelma and calcium. Head CT negative. Hs trop flat   Now in HD. Feels better. Weak. Denies CP or SOB. Says   General:  HD. Weak appearing. No resp difficulty HEENT: normal Neck: supple. no JVP 6 Carotids 2+ bilat; no bruits. No lymphadenopathy or thryomegaly appreciated. Cor: PMI nondisplaced. Regular rate & rhythm. No rubs, gallops or murmurs. Lungs: clear Abdomen: soft, nontender, nondistended. No hepatosplenomegaly. No bruits or masses. Good bowel sounds. Extremities: no cyanosis, clubbing,  rash, tr edema  LUE AVF Neuro: alert & orientedx3, cranial nerves grossly intact. moves all 4 extremities w/o difficulty. Affect pleasant  Suspect syncopal/seizure episode likely due to rhythm issue/block in setting of severe hyperkalemia. Hyperkalemia now being corrected with HD. Unclear what triggered hyperkalemia - says diet has not changed and has not missed HD.   Need to stop losartan. Avoid any k supplement, high-K foods and/or Mrs. Dash.   Volume status relatively stable on exam. Not much else to do from cardiac standpoint.   Watch on tele. Consider placing Zio patch at time of d/c.   Glori Bickers, MD  4:01 PM

## 2021-07-28 NOTE — Progress Notes (Signed)
Pharmacy Antibiotic Note  Kenneth [name] is a 65 y.o. male admitted on 07/28/2021 with a witness seizure and possible meningitis. On admission, LA >7.5, WBC 7.3, and afebrile. Antibiotics include ampicillin 2g q12h, cefepime and vancomycin. Pharmacy has been consulted for cefepime and vancomycin dosing.  Plan: Vancomycin 1500mg  x 1 as load Vancomycin dose per levels for unstable renal function Cefepime 1g q24h  F/u to schedule abx with HD as able  Weight:  (e.r scale broken, pt unstable to stand)  Temp (24hrs), Avg:97.2 F (36.2 C), Min:95.7 F (35.4 C), Max:98.1 F (36.7 C)  Recent Labs  Lab 07/28/21 0724 07/28/21 0822 07/28/21 0956  WBC 7.3  --   --   CREATININE 10.57*  --   --   LATICACIDVEN  --  1.7 1.7    CrCl cannot be calculated (Unknown ideal weight.).    No Known Allergies  Antimicrobials this admission: Ampicillin 11/14> Cefepime 11/14> Vancomycin 11/14>  Microbiology results: 11/14 BCx: sent Thank you for allowing pharmacy to participate in this patient's care.  Levonne Spiller, PharmD PGY1 Acute Care Resident  07/28/2021,4:16 PM

## 2021-07-28 NOTE — Procedures (Signed)
   I was present at this dialysis session, have reviewed the session itself and made  appropriate changes Kelly Splinter MD [facility] pager [phone]   07/28/2021, 2:02 PM

## 2021-07-28 NOTE — Hospital Course (Addendum)
Patient Summary Kenneth [name] is a 65 y.o. [place] speaking male with a history of DM, ESRD on dialysis, CAD s/p CABG, chronic HFpEF, HTN, and depression who presented on 11/14 with hyperkalemic emergency, hypothermia, and seizure like activity. Hospital course is outlined below.   ED Course Patient arrived via EMS after he appeared lethargic during dialysis. During transport, patient became unresponsive with gurgling respirations concerning for seizure like-activity. Upon arrival, temp was 95.7 and labs notable for K+ >7.5, Mg 3.1, Hgb 7.8, BUN 56, BNP elevated to 1,925, high sensitivity troponin 538 > 492. Lactic acid wnl at 1.7, normal WBC of 7.3. Head CT and CXR negative for acute abnormalities. Given temporizing measures for hyperkalemia including IV Ca++, IV insulin and glucose, IV bicarb, and albuterol. Nephrology was consulted and arranged emergent HD. Cardiology was consulted given patient's elevated troponin and suspected demand ischemia.   #ESRD requiring HD  #Hyperkalemic emergency  Patient's K+ decreased from >7.5 to 5.2 after emergent HD, and normalized to 4.6 on 11/15. He was restarted on Lokelma 10g. He underwent an additional HD session on Tuesday, 11/14, and was restarted back on his usual MWF schedule on 11/16. Cardiology recommended that he discontinue Losartan due to hyperkalemia.   #Seizure disorder  Neurology was consulted and determined that patient's presentation was consistent with electrolyte derangment as well as a seizure disorder, since his wife has reported episodes for the last 6 months. MRI w/out contrast of head was negative. EEGs did not show seizure activity. Initially there was concern for bacterial meningitis but his rapid improval after 1 session of HD and lack of fever made this very unlikely. He was started on Keppra 500mg  po daily with additional 250mg  after dialysis, which was continued on discharge.   #Acute on chronic anemia  #Anemia from chronic kidney disease   Hgb 7.8 initially, then decreased to 7. Patient received 1 unit of pRBCs and Hgb increased back to 7.8. Patient reported diarrhea with some blood for several months, as well as hematochezia. He underwent EGD, colonoscopy, and small bowel video capsule study during hospitalization in April 2022 which identified blood but no source of bleeding. His CKD is also likely contributing to his low baseline Hgb. Rectal exam was performed on 11/15 and one external hemorrhoid was visualized, no active bleeding. Patient reported the last episode of blood in his stool was last Friday, 11/12. He is due to receive ESA on 11/16. At time of discharge, patient's Hgb was 8.4 and he had no signs of active bleeding.   #Ischemic cardiomyopathy   #Paroxysmal afib  Patient's Eliquis was held due to low Hgb and concern for GI bleed. Repeat EKG showed sinus rhythm and at time of discharge, patient was restarted on Eliquis.   #Type 1 DM  Patient was placed on very sensitive SSI at mealtimes and bedtime with routine CBG monitoring q4h. He will resume usage of his insulin pump when he returns home.   #HTN Patient's BP was moderately elevated on admission. He was restarted on his home medication of Imdur 60mg  po daily (non HD days) and Hydralazine 100mg  TID (hold first 2 doses on HD days) on 11/15.   Follow-Up Patient has follow-up with ID on 12/8, cardiology on 12/6, and endocrinology on 1/26. PT and OT recommended home health PT/OT, which was arranged. Patient was instructed to make follow-up appointment with PCP in 1-2 weeks and schedule outpatient neurology appointment for seizures.

## 2021-07-28 NOTE — Consult Note (Signed)
Renal Service Consult Note [facility]  Kenneth [name] 07/28/2021 [name], MD Requesting Physician: Dr Philipp Ovens  Reason for Consult: ESRD pt w/ hyperkalemia and widened QRS HPI: The patient is a 65 y.o. year-old w/ hx of CAD/ sp CABG, depression, ICM/ CHF, DM2, HL, HTN, PAF and ESRD on HD MWF who presented to ED w/ gen'd weakness developing over the weekend, unable to stand to get on the scale at HD this am. Taken to ED for gen'd weakness. In ED labs are showing K+ of > 7.5, BUN 56, creat 10.5, CO2 24.  Alb 3.9.  BNP 1925.  Trop 583.  WBC 7K, Hb 7.8 , covid negative. In ED pt was rx'd w/ temporizing measures including IV Ca++, IV ins/ glucose, IV NaHCO3 amp and 10mg  albuterol nebs.  Asked to see for urgent dialysis.   Pt seen in ED. Wife gives hx that his Kenneth [name] (was taking on non HD days TTS 3 x per week) was dc'd about 2-3 wks ago after labs showed K+ was under control.  No other hx. No SOB or abd pain. Had HD MWF last week.    ROS - denies CP, no joint pain, no HA, no blurry vision, no rash, no diarrhea, no nausea/ vomiting, no dysuria, no difficulty voiding   Past Medical History  Past Medical History:  Diagnosis Date   Acute respiratory failure with hypoxemia ([place]) 05/31/2016   CAD (coronary artery disease) 10/30/2016   NSTEMI 9/17 with CABG x 3 (LIMA-LAD, SVG-OM, SVG-RCA).   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   type 1 DM   ESRD on hemodialysis ([place])    "MWF; [facility]" (03/30/2018)   Gastroesophageal reflux disease 08/18/2016   HCAP (healthcare-associated pneumonia)    Hyperlipidemia    Hypertension    Hypertensive heart disease 09/12/2017   Ischemic rest pain of lower extremity 02/06/2015   Keratoma 02/27/2015   Metatarsal deformity 02/27/2015   NSTEMI (non-ST elevated myocardial infarction) (HCC)    PAF  (paroxysmal atrial fibrillation) (HCC)    Pleural effusion    Pronation deformity of both feet 02/27/2015   Past Surgical History  Past Surgical History:  Procedure Laterality Date   A/V FISTULAGRAM Left 03/06/2021   Procedure: A/V FISTULAGRAM;  Surgeon: Marty Heck, MD;  Location: [place] [facility];  Service: Cardiovascular;  Laterality: Left;   AV FISTULA PLACEMENT Left 06/22/2016   Procedure: ARTERIOVENOUS (AV) FISTULA CREATION LEFT UPPER ARM;  Surgeon: Rosetta Posner, MD;  Location: [place];  Service: Vascular;  Laterality: Left;   BIOPSY  12/19/2020   Procedure: BIOPSY;  Surgeon: Ladene Artist, MD;  Location: [place];  Service: Endoscopy;;   CARDIAC CATHETERIZATION N/A 05/31/2016   Procedure: Left Heart Cath and Coronary Angiography;  Surgeon: Jettie Booze, MD;  Location: [place] [facility];  Service: Cardiovascular;  Laterality: N/A;   CARDIAC CATHETERIZATION N/A 05/31/2016   Procedure: Right Heart Cath;  Surgeon: Jettie Booze, MD;  Location: [place] [facility];  Service: Cardiovascular;  Laterality: N/A;   CARDIAC CATHETERIZATION N/A 05/31/2016   Procedure: IABP Insertion;  Surgeon: Jettie Booze, MD;  Location: [place] [facility];  Service: Cardiovascular;  Laterality: N/A;   COLONOSCOPY WITH PROPOFOL N/A 12/20/2020   Procedure: COLONOSCOPY WITH PROPOFOL;  Surgeon: Mauri Pole,  MD;  Location: [place] [facility];  Service: Endoscopy;  Laterality: N/A;   CORONARY ARTERY BYPASS GRAFT N/A 06/05/2016   Procedure: CORONARY ARTERY BYPASS GRAFTING (CABG) x3 LIMA to LAD -SVG to OM -SVG to RCA;  Surgeon: Ivin Poot, MD;  Location: [place];  Service: Open Heart Surgery;  Laterality: N/A;   CORONARY STENT INTERVENTION N/A 07/07/2017   Procedure: CORONARY STENT INTERVENTION;  Surgeon: Wellington Hampshire, MD;  Location: [place] [facility];  Service: Cardiovascular;  Laterality: N/A;   CORONARY STENT INTERVENTION N/A 03/30/2018   Procedure: CORONARY STENT INTERVENTION;   Surgeon: Martinique, Peter M, MD;  Location: [place] [facility];  Service: Cardiovascular;  Laterality: N/A;   ESOPHAGOGASTRODUODENOSCOPY (EGD) WITH PROPOFOL N/A 12/19/2020   Procedure: ESOPHAGOGASTRODUODENOSCOPY (EGD) WITH PROPOFOL;  Surgeon: Ladene Artist, MD;  Location: [facility];  Service: Endoscopy;  Laterality: N/A;   GIVENS CAPSULE STUDY N/A 12/20/2020   Procedure: GIVENS CAPSULE STUDY;  Surgeon: Mauri Pole, MD;  Location: [place];  Service: Endoscopy;  Laterality: N/A;   INSERTION OF DIALYSIS CATHETER N/A 06/05/2016   Procedure: INSERTION OF DIALYSIS/trialysis CATHETER;  Surgeon: Ivin Poot, MD;  Location: [place];  Service: Vascular;  Laterality: N/A;   INSERTION OF DIALYSIS CATHETER Right 06/13/2016   Procedure: INSERTION OF DIALYSIS CATHETER RIGHT INTERNAL JUGULAR;  Surgeon: Conrad Glen Allen, MD;  Location: [place];  Service: Vascular;  Laterality: Right;   INSERTION OF DIALYSIS CATHETER Right 09/07/2019   Procedure: Insertion Of Dialysis Catheter;  Surgeon: Rosetta Posner, MD;  Location: [facility];  Service: Vascular;  Laterality: Right;   INTRAOPERATIVE TRANSESOPHAGEAL ECHOCARDIOGRAM N/A 06/05/2016   Procedure: INTRAOPERATIVE TRANSESOPHAGEAL ECHOCARDIOGRAM;  Surgeon: Ivin Poot, MD;  Location: [place];  Service: Open Heart Surgery;  Laterality: N/A;   LEFT HEART CATH AND CORS/GRAFTS ANGIOGRAPHY N/A 11/03/2016   Procedure: Left Heart Cath and Cors/Grafts Angiography;  Surgeon: Troy Sine, MD;  Location: [place] [facility];  Service: Cardiovascular;  Laterality: N/A;   LEFT HEART CATH AND CORS/GRAFTS ANGIOGRAPHY N/A 07/07/2017   Procedure: LEFT HEART CATH AND CORS/GRAFTS ANGIOGRAPHY;  Surgeon: Larey Dresser, MD;  Location: [place] [facility];  Service: Cardiovascular;  Laterality: N/A;   LEFT HEART CATH AND CORS/GRAFTS ANGIOGRAPHY N/A 03/30/2018   Procedure: LEFT HEART CATH AND CORS/GRAFTS ANGIOGRAPHY;  Surgeon: Martinique, Peter M, MD;  Location: [place] [facility];  Service:  Cardiovascular;  Laterality: N/A;   LEFT HEART CATH AND CORS/GRAFTS ANGIOGRAPHY N/A 04/17/2019   Procedure: LEFT HEART CATH AND CORS/GRAFTS ANGIOGRAPHY;  Surgeon: Larey Dresser, MD;  Location: [place] [facility];  Service: Cardiovascular;  Laterality: N/A;   PERIPHERAL VASCULAR INTERVENTION Left 03/06/2021   Procedure: PERIPHERAL VASCULAR INTERVENTION;  Surgeon: Marty Heck, MD;  Location: [place] [facility];  Service: Cardiovascular;  Laterality: Left;   POLYPECTOMY  12/20/2020   Procedure: POLYPECTOMY;  Surgeon: Mauri Pole, MD;  Location: [place] [facility];  Service: Endoscopy;;   REVISION OF ARTERIOVENOUS GORETEX GRAFT Left 65/11/5463   Procedure: PLICATION OF ANEURYSM OF ARTERIOVENOUS FISTULA  LEFT ARM;  Surgeon: Rosetta Posner, MD;  Location: [place];  Service: Vascular;  Laterality: Left;   REVISON OF ARTERIOVENOUS FISTULA Left 09/07/2019   Procedure: REVISON OF LEFT UPPER ARM ARTERIOVENOUS FISTULA;  Surgeon: Rosetta Posner, MD;  Location: [place];  Service: Vascular;  Laterality: Left;   RIGHT/LEFT HEART CATH AND CORONARY ANGIOGRAPHY N/A 01/03/2021   Procedure: RIGHT/LEFT HEART CATH AND CORONARY ANGIOGRAPHY;  Surgeon: Aundra Dubin,  Elby Showers, MD;  Location: [place] [facility];  Service: Cardiovascular;  Laterality: N/A;   Family History  Family History  Problem Relation Age of Onset   CAD Father    Colon cancer Father    Diabetes Brother    Social History  reports that he quit smoking about 8 years ago. His smoking use included cigarettes. He has a 52.50 pack-year smoking history. He has never used smokeless tobacco. He reports that he does not drink alcohol and does not use drugs. Allergies No Known Allergies Home medications Prior to Admission medications   Medication Sig Start Date End Date Taking? Authorizing Provider  acetaminophen (TYLENOL) 500 MG tablet Take 1,000 mg by mouth every 6 (six) hours as needed for mild pain or headache.     [provider]  apixaban  (ELIQUIS) 5 MG TABS tablet Take 1 tablet (5 mg total) by mouth 2 (two) times daily. 01/28/21   Larey Dresser, MD  Ascorbic Acid (VITAMIN C) 1000 MG tablet Take 1,000 mg by mouth daily.    [provider]  atorvastatin (LIPITOR) 80 MG tablet Take 1 tablet by mouth once daily 07/08/21   Larey Dresser, MD  calcium acetate (PHOSLO) 667 MG capsule Take 667 mg by mouth 3 (three) times daily with meals.  07/09/17   [provider]  citalopram (CELEXA) 20 MG tablet Take 20 mg by mouth daily with lunch.  02/25/17   [provider]  ezetimibe (ZETIA) 10 MG tablet Take 10 mg by mouth daily.    [provider]  gabapentin (NEURONTIN) 100 MG capsule Take 1 capsule (100 mg total) by mouth at bedtime. 01/04/21   Geradine Girt, DO  glucose blood (CONTOUR NEXT TEST) test strip Test BS 5 times a day 03/04/21   [provider]  hydrALAZINE (APRESOLINE) 100 MG tablet Take 1 tablet (100 mg total) by mouth 3 (three) times daily. DO NOT TAKE AM AND NOON DOSES ON HD DAYS 07/10/21   Larey Dresser, MD  insulin glargine, 2 Unit Dial, (TOUJEO MAX SOLOSTAR) 300 UNIT/ML Solostar Pen Inject into the skin. 04/02/21   [provider]  Insulin Human (INSULIN PUMP) SOLN Inject into the skin continuous. NOVOLOG    [provider]  isosorbide mononitrate (IMDUR) 60 MG 24 hr tablet Take 1 tablet (60 mg total) by mouth daily. DO NOT TAKE ON HD DAYS 07/10/21   Larey Dresser, MD  losartan (COZAAR) 25 MG tablet TAKE 1 TABLET BY MOUTH AT BEDTIME 07/08/21   Larey Dresser, MD  Methoxy PEG-Epoetin Beta (MIRCERA IJ) Mircera 03/17/21 03/16/22  [provider]  NOVOLOG 100 UNIT/ML injection Inject 6-10 Units into the skin See admin instructions. Starts with 10 units per day and adds 6 units with each meal via pump 01/08/19   [provider]  pantoprazole (PROTONIX) 40 MG tablet Take 40 mg by mouth daily before lunch.  01/07/17   [provider]  sodium  zirconium cyclosilicate (LOKELMA) 10 g PACK packet Take 10 g by mouth See admin instructions. Takes on Tues, Thur, Sat only.    [provider]  vitamin B-12 (CYANOCOBALAMIN) 500 MCG tablet Take 1 tablet (500 mcg total) by mouth daily. 01/05/21   Geradine Girt, DO     Vitals:   07/28/21 1200 07/28/21 1217 07/28/21 1238 07/28/21 1300  BP: (!) 144/59 (!) 136/58 (!) 149/66 (!) 151/66  Pulse: 74 73 74 75  Resp: 14 (!) 22 (!) 22  Temp:  97.7 F (36.5 C)    TempSrc:  Temporal    SpO2: 98% 99%     Exam Gen alert, no distress, looks tired Telemetry QRS has narrowed, HR stable No rash, cyanosis or gangrene Sclera anicteric, throat clear  No jvd or bruits Chest clear bilat to bases, no rales/ wheezing RRR no MRG Abd soft ntnd no mass or ascites +bs GU normal MS no joint effusions or deformity Ext no LE or UE edema, no wounds or ulcers Neuro is alert, Ox 3, nf, gen weak  LUA AVF+bruit      Home meds include - eliquis, lipitor, phoslo 1 ac tid, celexa, zetia, neurontin, hydralazine 100 tid, insulin glargine/ pump, imdur, losartan, novolog tid ac, protonix, lokelma TTS (stopped 2 wks ago), vits/ supps/ prns    OP HD: MWF SW   3h 73min  400/500   59kg  2/2 bath  P4  Hep none  - sensipar 60 tiw  - hect 2 ug tiw  - aranesp 200 weekly last 11/9, due 11/16   Assessment/ Plan: Severe hyperkalemia - his tiw Lokelma was stopped 2-3 wks ago, will look into this. In ED pt was rx'd w/ temporizing measures (IV Ca++/ IV ins+ glucose/ IV NaHCO3 + 10mg  albuterol nebs). Going up for HD this afternoon asap.  ESRD - on HD MWF. HD today upstairs.  CAD / hx atrial fib Vol /HTN - BP's wnl here, no gross edema or resp issues on exam. UF 2 L w/ HD today as tolerated Anemia ckd - esa due 11/16. Hb low 7.8, follow.  MBD ckd - Ca ok, cont binders, vdra and sensipar           Kelly Splinter  MD 07/28/2021, 1:16 PM  Recent Labs  Lab 07/28/21 0724  WBC 7.3  HGB 7.8*   Recent Labs   Lab 07/28/21 0724  K >7.5*  BUN 56*  CREATININE 10.57*  CALCIUM 9.3

## 2021-07-28 NOTE — ED Notes (Signed)
Provider at bedside

## 2021-07-28 NOTE — Consult Note (Signed)
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  ID consulted for possible infectious etiology.  #Positive with IGRA #Nuchal rigidity/hypothermia  #Provisional diagnosis of toxoplasmosis -His overall clinical picture in the hospital seems most consistent with hyperkalemia, although hypothermia is concerning.  He had peaked T waves on EKG, electrolyte derangements, as well as seizure-like in the ED. Patient had been on Lokelma chronically which was stopped 2 to 3 weeks ago. - Chest x-ray normal on admission - Patient has a history of BCG vaccine which does not preclude latent TB.  It appears Dr. Gale Journey his infectious disease physician at [facility] has discussed tb treatment with patient.  It appears TB was considered leading to uveitis but patient's symptoms have improved on atovaquone. -Neurology consulted for possible LP/EEG.   Recommendations: -Obtain blood cultures -If LP is done would like to get MTB NAA,T gondii PCR in addition to standard LP -Okay with broad-spectrum antibiotics while waiting for LP. - We will follow-up MRI brain - Continue to discuss the need for treatment for latent TB. - As chest x-ray is negative, not concerned for pulmonary tb - Patient needs follow-up with ophthalmology    Microbiology:   Antibiotics: Atovaquone-home med Cultures:   HPI: Kenneth [name] is a 65 y.o. 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   He was found to have hyper per kalemia with potassium 7.5 and peaked T waves on EKG.  Nephrology consulted and patient underwent emergent hemodialysis.  Patient had been on [facility] which was discontinued 2- 3 weeks ago.  As patient's temp 95.7 with complaint of nuchal rigidity and headaches neurology having consulted for possible LP.  He is started on empiric vancomycin, cefepime, ampicillin.   Per chart review patient seen by [facility] infectious disease, Dr. Gale Journey for provisional diagnosis of toxoplasmosis panuveitis.  He was initially treated with Bactrim but developed dizziness and transitioned to atovaquone.  Patient had been seen by a retinal specialist for his right eye.  He started having trouble with his left eye for 6 months.  Work-up revealed +toxo IgG positive, IgM negative.  He reports that his vision on atovaquone had improved.  He had a positive IGRA which pt strongly believed was due to BCG vaccine. There was ongoing discussion about treatment for latent tb.  Today, interpreter was used to speak  with patient.  Pt reports that he is short of breath for the past 6 months as well as has a  persistent cough.  He reports for the past couple days he has been feeling weak.  He did report his vision in left eye has improved since being on atovaquone. He states that its like a heavy curtain has changed into a light curtain.  He reports being adherent  to hemodialysis.  He reported persistent headache as well.  He denies fevers and chills    Review of Systems: Review of Systems  All other systems reviewed and are negative.  Past Medical  History:  Diagnosis Date   Acute respiratory failure with hypoxemia ([place]) 05/31/2016   CAD (coronary artery disease) 10/30/2016   NSTEMI 9/17 with CABG x 3 (LIMA-LAD, SVG-OM, SVG-RCA).   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    type 1 DM   ESRD on hemodialysis ([place])    "MWF; [facility]" (03/30/2018)   Gastroesophageal reflux disease 08/18/2016   HCAP (healthcare-associated pneumonia)    Hyperlipidemia    Hypertension    Hypertensive heart disease 09/12/2017   Ischemic rest pain of lower extremity 02/06/2015   Keratoma 02/27/2015   Metatarsal deformity 02/27/2015   NSTEMI (non-ST elevated myocardial infarction) (HCC)    PAF (paroxysmal atrial fibrillation) (HCC)    Pleural effusion    Pronation deformity of both feet 02/27/2015    Social History   Tobacco Use   Smoking status: Former    Packs/day: 1.50    Years: 35.00    Pack years: 52.50    Types: Cigarettes    Quit date: 2014    Years since quitting: 8.8   Smokeless tobacco: Never   Tobacco comments:    "quit e-cigs ~ 2014"  Vaping Use   Vaping Use: Former  Substance Use Topics   Alcohol use: Never    Alcohol/week: 0.0 standard drinks   Drug use: Never    Family History  Problem Relation Age of Onset   CAD Father    Colon cancer Father    Diabetes Brother    Scheduled Meds:  Chlorhexidine Gluconate Cloth  6 each Topical Q0600   [START ON 07/30/2021] cinacalcet  60 mg Oral Q M,W,F-HD   [START ON 07/30/2021] doxercalciferol  2 mcg Intravenous Q M,W,F-HD   insulin aspart  0-9 Units Subcutaneous Q4H   [START ON 07/30/2021] levETIRAcetam  250 mg Oral Q M,W,F-2000   [START ON 07/29/2021] levETIRAcetam  500 mg Oral Daily   vancomycin variable dose per unstable renal function (pharmacist dosing)   Does not apply See admin instructions   Continuous Infusions:  ampicillin (OMNIPEN) IV     ceFEPime (MAXIPIME) IV     levETIRAcetam     vancomycin     PRN Meds:.LORazepam No Known Allergies  OBJECTIVE: Blood pressure (!) 170/96, pulse 96, temperature 97.7 F (36.5 C), temperature source Temporal, resp. rate 17, weight 64.9 kg, SpO2 99 %.  Physical Exam Constitutional:      Appearance: Normal appearance.     Comments: Answered questions  appropirately  HENT:     Head: Normocephalic and atraumatic.     Right Ear: External ear normal.     Mouth/Throat:     Mouth: Mucous membranes are moist.  Eyes:     Extraocular Movements: Extraocular movements intact.     Conjunctiva/sclera: Conjunctivae normal.     Pupils: Pupils are equal, round, and reactive to light.  Cardiovascular:     Rate and Rhythm: Normal rate and regular rhythm.  Pulmonary:     Effort: Pulmonary effort is normal.     Breath sounds: Normal breath sounds.  Abdominal:     General: Abdomen is flat. Bowel sounds are normal.     Palpations: Abdomen is soft.  Skin:    General: Skin is warm and dry.  Neurological:     Mental Status: He is oriented to person, place, and time.  Lab Results Lab Results  Component Value Date   WBC 7.3 07/28/2021   HGB 7.8 (L) 07/28/2021   HCT 25.5 (L) 07/28/2021   MCV 99.6 07/28/2021   PLT 210 07/28/2021    Lab Results  Component Value Date   CREATININE 10.57 (H) 07/28/2021   BUN 56 (H) 07/28/2021   NA 132 (L) 07/28/2021   K >7.5 (HH) 07/28/2021   CL 92 (L) 07/28/2021   CO2 24 07/28/2021    Lab Results  Component Value Date   ALT 27 07/28/2021   AST 44 (H) 07/28/2021   ALKPHOS 77 07/28/2021   BILITOT 0.6 07/28/2021       Laurice Record, [place] for Infectious Disease [facility] 07/28/2021, 5:21 PM

## 2021-07-28 NOTE — ED Notes (Signed)
Report given to Dialysis RN.

## 2021-07-28 NOTE — ED Notes (Signed)
Patient transported to MRI 

## 2021-07-28 NOTE — ED Provider Notes (Signed)
[facility] EMERGENCY DEPARTMENT Provider Note   CSN: [id number] Arrival date & time: 07/28/21  7371     History Chief Complaint  Patient presents with   Fatigue    Kenneth [name] is a 65 y.o. male.  HPI Patient presents via EMS from dialysis.  Level 5 caveat secondary to mental status change/acuity of condition.  Initial details attained from EMS as the patient was listless, but eventually the patient's wife arrives.  Seems over the past 2 weeks patient has had fatigue, but has been going to dialysis regularly.  Initially no report of diarrhea, bloody stool, or anything substantially, but eventually seems that the patient has had some blood mixed with the stool, though different from experience he had 5 months ago during prior hospitalization.  Today, with worsening fatigue, he went to dialysis, was sent here for evaluation.  EMS reports that at dialysis patient had an episode of listlessness, and had seizure-like activity on arrival to the ED.  He did not receive medications there or here, but had cessation of seizure-like activity on transfer to the hospital gurney.   [place] translator used Past Medical History:  Diagnosis Date   Acute respiratory failure with hypoxemia ([place]) 05/31/2016   CAD (coronary artery disease) 10/30/2016   NSTEMI 9/17 with CABG x 3 (LIMA-LAD, SVG-OM, SVG-RCA).   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   ESRD on hemodialysis ([place])    "MWF; [place]" (03/30/2018)   Gastroesophageal reflux disease 08/18/2016   HCAP (healthcare-associated pneumonia)    Hyperlipidemia    Hypertension    Hypertensive heart disease 09/12/2017   Ischemic rest pain of lower extremity 02/06/2015   Keratoma 02/27/2015   Metatarsal deformity 02/27/2015   NSTEMI (non-ST elevated myocardial infarction) ([place])    PAF  (paroxysmal atrial fibrillation) (HCC)    Pleural effusion    Pronation deformity of both feet 02/27/2015    Patient Active Problem List   Diagnosis Date Noted   Acute hypoxemic respiratory failure ([place]) 01/01/2021   Prolonged QT interval 01/01/2021   Hemoptysis 01/01/2021   Occult blood in stools    Symptomatic bradycardia 12/15/2020   Postural dizziness with presyncope 12/15/2020   Blood in stool 03/09/9484   Acute metabolic encephalopathy 46/27/0350   Altered mental status 08/27/2019   Unresponsive episode 08/27/2019   AF (paroxysmal atrial fibrillation) ([place]) 08/27/2019   Status post coronary artery stent placement    NSTEMI (non-ST elevated myocardial infarction) ([place]) 03/30/2018   Hypertensive heart disease 09/12/2017   Hyperlipidemia 09/12/2017   Hyperkalemia 07/20/2017   Chronic diastolic CHF (congestive heart failure) ([place]) 04/14/2017   Hypertensive emergency 04/14/2017   Anemia, chronic renal failure 04/11/2017   Fluid overload 04/11/2017   Diabetic foot ulcer ([place]) 04/11/2017   Cellulitis in diabetic foot (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   Ischemic rest pain of lower extremity ([place]) 02/06/2015  Past Surgical History:  Procedure Laterality Date   A/V FISTULAGRAM Left 03/06/2021   Procedure: A/V FISTULAGRAM;  Surgeon: Marty Heck, MD;  Location: [place] [facility];  Service: Cardiovascular;  Laterality: Left;   AV FISTULA PLACEMENT Left 06/22/2016   Procedure: ARTERIOVENOUS (AV) FISTULA CREATION LEFT UPPER  ARM;  Surgeon: Rosetta Posner, MD;  Location: [place];  Service: Vascular;  Laterality: Left;   BIOPSY  12/19/2020   Procedure: BIOPSY;  Surgeon: Ladene Artist, MD;  Location: [place];  Service: Endoscopy;;   CARDIAC CATHETERIZATION N/A 05/31/2016   Procedure: Left Heart Cath and Coronary Angiography;  Surgeon: Jettie Booze, MD;  Location: [place] [facility];  Service: Cardiovascular;  Laterality: N/A;   CARDIAC CATHETERIZATION N/A 05/31/2016   Procedure: Right Heart Cath;  Surgeon: Jettie Booze, MD;  Location: [place] [facility];  Service: Cardiovascular;  Laterality: N/A;   CARDIAC CATHETERIZATION N/A 05/31/2016   Procedure: IABP Insertion;  Surgeon: Jettie Booze, MD;  Location: [place] [facility];  Service: Cardiovascular;  Laterality: N/A;   COLONOSCOPY WITH PROPOFOL N/A 12/20/2020   Procedure: COLONOSCOPY WITH PROPOFOL;  Surgeon: Mauri Pole, MD;  Location: [place] [facility];  Service: Endoscopy;  Laterality: N/A;   CORONARY ARTERY BYPASS GRAFT N/A 06/05/2016   Procedure: CORONARY ARTERY BYPASS GRAFTING (CABG) x3 LIMA to LAD -SVG to OM -SVG to RCA;  Surgeon: Ivin Poot, MD;  Location: [place];  Service: Open Heart Surgery;  Laterality: N/A;   CORONARY STENT INTERVENTION N/A 07/07/2017   Procedure: CORONARY STENT INTERVENTION;  Surgeon: Wellington Hampshire, MD;  Location: [place] [facility];  Service: Cardiovascular;  Laterality: N/A;   CORONARY STENT INTERVENTION N/A 03/30/2018   Procedure: CORONARY STENT INTERVENTION;  Surgeon: Martinique, Peter M, MD;  Location: [place] [facility];  Service: Cardiovascular;  Laterality: N/A;   ESOPHAGOGASTRODUODENOSCOPY (EGD) WITH PROPOFOL N/A 12/19/2020   Procedure: ESOPHAGOGASTRODUODENOSCOPY (EGD) WITH PROPOFOL;  Surgeon: Ladene Artist, MD;  Location: [facility] ENDOSCOPY;  Service: Endoscopy;  Laterality: N/A;   GIVENS CAPSULE STUDY N/A 12/20/2020   Procedure: GIVENS CAPSULE STUDY;  Surgeon: Mauri Pole, MD;  Location: [place];   Service: Endoscopy;  Laterality: N/A;   INSERTION OF DIALYSIS CATHETER N/A 06/05/2016   Procedure: INSERTION OF DIALYSIS/trialysis CATHETER;  Surgeon: Ivin Poot, MD;  Location: [place];  Service: Vascular;  Laterality: N/A;   INSERTION OF DIALYSIS CATHETER Right 06/13/2016   Procedure: INSERTION OF DIALYSIS CATHETER RIGHT INTERNAL JUGULAR;  Surgeon: Conrad [place] Lans[place]ne, MD;  Location: [place];  Service: Vascular;  Laterality: Right;   INSERTION OF DIALYSIS CATHETER Right 09/07/2019   Procedure: Insertion Of Dialysis Catheter;  Surgeon: Rosetta Posner, MD;  Location: [facility];  Service: Vascular;  Laterality: Right;   INTRAOPERATIVE TRANSESOPHAGEAL ECHOCARDIOGRAM N/A 06/05/2016   Procedure: INTRAOPERATIVE TRANSESOPHAGEAL ECHOCARDIOGRAM;  Surgeon: Ivin Poot, MD;  Location: [place];  Service: Open Heart Surgery;  Laterality: N/A;   LEFT HEART CATH AND CORS/GRAFTS ANGIOGRAPHY N/A 11/03/2016   Procedure: Left Heart Cath and Cors/Grafts Angiography;  Surgeon: Troy Sine, MD;  Location: [place] [facility];  Service: Cardiovascular;  Laterality: N/A;   LEFT HEART CATH AND CORS/GRAFTS ANGIOGRAPHY N/A 07/07/2017   Procedure: LEFT HEART CATH AND CORS/GRAFTS ANGIOGRAPHY;  Surgeon: Larey Dresser, MD;  Location: [place] [facility];  Service: Cardiovascular;  Laterality: N/A;   LEFT HEART CATH AND CORS/GRAFTS ANGIOGRAPHY N/A 03/30/2018   Procedure: LEFT HEART CATH AND CORS/GRAFTS ANGIOGRAPHY;  Surgeon: Martinique, Peter M, MD;  Location: [place] [facility];  Service: Cardiovascular;  Laterality: N/A;   LEFT HEART CATH AND CORS/GRAFTS ANGIOGRAPHY N/A 04/17/2019   Procedure: LEFT HEART CATH AND CORS/GRAFTS ANGIOGRAPHY;  Surgeon: Larey Dresser, MD;  Location: [place] [facility];  Service: Cardiovascular;  Laterality: N/A;   PERIPHERAL VASCULAR INTERVENTION Left 03/06/2021   Procedure: PERIPHERAL VASCULAR INTERVENTION;  Surgeon: Marty Heck, MD;  Location: [place] [facility];  Service: Cardiovascular;  Laterality:  Left;   POLYPECTOMY  12/20/2020   Procedure: POLYPECTOMY;  Surgeon: Mauri Pole, MD;  Location: [place];  Service: Endoscopy;;   REVISION OF ARTERIOVENOUS GORETEX GRAFT Left 38/46/6599   Procedure: PLICATION OF ANEURYSM OF ARTERIOVENOUS FISTULA  LEFT ARM;  Surgeon: Rosetta Posner, MD;  Location: [place];  Service: Vascular;  Laterality: Left;   REVISON OF ARTERIOVENOUS FISTULA Left 09/07/2019   Procedure: REVISON OF LEFT UPPER ARM ARTERIOVENOUS FISTULA;  Surgeon: Rosetta Posner, MD;  Location: [place];  Service: Vascular;  Laterality: Left;   RIGHT/LEFT HEART CATH AND CORONARY ANGIOGRAPHY N/A 01/03/2021   Procedure: RIGHT/LEFT HEART CATH AND CORONARY ANGIOGRAPHY;  Surgeon: Larey Dresser, MD;  Location: [place] [facility];  Service: Cardiovascular;  Laterality: N/A;       Family History  Problem Relation Age of Onset   CAD Father    Colon cancer Father    Diabetes Brother     Social History   Tobacco Use   Smoking status: Former    Packs/day: 1.50    Years: 35.00    Pack years: 52.50    Types: Cigarettes    Quit date: 2014    Years since quitting: 8.8   Smokeless tobacco: Never   Tobacco comments:    "quit e-cigs ~ 2014"  Vaping Use   Vaping Use: Former  Substance Use Topics   Alcohol use: Never    Alcohol/week: 0.0 standard drinks   Drug use: Never    Home Medications Prior to Admission medications   Medication Sig Start Date End Date Taking? Authorizing Provider  acetaminophen (TYLENOL) 500 MG tablet Take 1,000 mg by mouth every 6 (six) hours as needed for mild pain or headache.     [provider]  apixaban (ELIQUIS) 5 MG TABS tablet Take 1 tablet (5 mg total) by mouth 2 (two) times daily. 01/28/21   Larey Dresser, MD  Ascorbic Acid (VITAMIN C) 1000 MG tablet Take 1,000 mg by mouth daily.    [provider]  atorvastatin (LIPITOR) 80 MG tablet Take 1 tablet by mouth once daily 07/08/21   Larey Dresser, MD  calcium acetate (PHOSLO) 667 MG  capsule Take 667 mg by mouth 3 (three) times daily with meals.  07/09/17   [provider]  citalopram (CELEXA) 20 MG tablet Take 20 mg by mouth daily with lunch.  02/25/17   [provider]  ezetimibe (ZETIA) 10 MG tablet Take 10 mg by mouth daily.    [provider]  gabapentin (NEURONTIN) 100 MG capsule Take 1 capsule (100 mg total) by mouth at bedtime. 01/04/21   Geradine Girt, DO  glucose blood (CONTOUR NEXT TEST) test strip Test BS 5 times a day 03/04/21   [provider]  hydrALAZINE (APRESOLINE) 100 MG tablet Take 1 tablet (100 mg total) by mouth 3 (three) times daily. DO NOT TAKE AM AND NOON DOSES ON HD DAYS 07/10/21   Larey Dresser, MD  insulin glargine,  2 Unit Dial, (TOUJEO MAX SOLOSTAR) 300 UNIT/ML Solostar Pen Inject into the skin. 04/02/21   [provider]  Insulin Human (INSULIN PUMP) SOLN Inject into the skin continuous. NOVOLOG    [provider]  isosorbide mononitrate (IMDUR) 60 MG 24 hr tablet Take 1 tablet (60 mg total) by mouth daily. DO NOT TAKE ON HD DAYS 07/10/21   Larey Dresser, MD  losartan (COZAAR) 25 MG tablet TAKE 1 TABLET BY MOUTH AT BEDTIME 07/08/21   Larey Dresser, MD  Methoxy PEG-Epoetin Beta (MIRCERA IJ) Mircera 03/17/21 03/16/22  [provider]  NOVOLOG 100 UNIT/ML injection Inject 6-10 Units into the skin See admin instructions. Starts with 10 units per day and adds 6 units with each meal via pump 01/08/19   [provider]  pantoprazole (PROTONIX) 40 MG tablet Take 40 mg by mouth daily before lunch.  01/07/17   [provider]  sodium zirconium cyclosilicate (LOKELMA) 10 g PACK packet Take 10 g by mouth See admin instructions. Takes on Tues, Thur, Sat only.    [provider]  vitamin B-12 (CYANOCOBALAMIN) 500 MCG tablet Take 1 tablet (500 mcg total) by mouth daily. 01/05/21   Geradine Girt, DO    Allergies    Patient has no known allergies.  Review of Systems    Review of Systems  Unable to perform ROS: Mental status change   Physical Exam Updated Vital Signs BP (!) 159/62   Pulse 65   Temp 98.1 F (36.7 C) (Rectal)   Resp 15   SpO2 100%   Physical Exam Vitals and nursing note reviewed.  Constitutional:      Appearance: He is well-developed.     Comments: Listless adult male  HENT:     Head: Normocephalic and atraumatic.  Eyes:     Conjunctiva/sclera: Conjunctivae normal.  Cardiovascular:     Rate and Rhythm: Normal rate and regular rhythm.     Comments: Palpable pulses Pulmonary:     Effort: Pulmonary effort is normal. No respiratory distress.     Breath sounds: No stridor.  Abdominal:     General: There is no distension.  Skin:    General: Skin is warm and dry.  Neurological:     Comments: Moves extremities spontaneously, and to stimuli initially, and consistent with postictal phase, but eventually does awaken, is interactive.  Psychiatric:        Mood and Affect: Mood normal.     Comments: Initially impaired, but improved to normal, pleasant    ED Results / Procedures / Treatments   Labs (all labs ordered are listed, but only abnormal results are displayed) Labs Reviewed  COMPREHENSIVE METABOLIC PANEL - Abnormal; Notable for the following components:      Result Value   Sodium 132 (*)    Potassium >7.5 (*)    Chloride 92 (*)    Glucose, Bld 213 (*)    BUN 56 (*)    Creatinine, Ser 10.57 (*)    AST 44 (*)    GFR, Estimated 5 (*)    Anion gap 16 (*)    All other components within normal limits  BRAIN NATRIURETIC PEPTIDE - Abnormal; Notable for the following components:   B Natriuretic Peptide 1,925.2 (*)    All other components within normal limits  CBC WITH DIFFERENTIAL/PLATELET - Abnormal; Notable for the following components:   RBC 2.56 (*)    Hemoglobin 7.8 (*)    HCT 25.5 (*)    RDW  18.0 (*)    All other components within normal limits  MAGNESIUM - Abnormal; Notable for the following components:    Magnesium 3.1 (*)    All other components within normal limits  CBG MONITORING, ED - Abnormal; Notable for the following components:   Glucose-Capillary 189 (*)    All other components within normal limits  CBG MONITORING, ED - Abnormal; Notable for the following components:   Glucose-Capillary 167 (*)    All other components within normal limits  TROPONIN I (HIGH SENSITIVITY) - Abnormal; Notable for the following components:   Troponin I (High Sensitivity) 583 (*)    All other components within normal limits  RESP PANEL BY RT-PCR (FLU A&B, COVID) ARPGX2  LACTIC ACID, PLASMA  D-DIMER, QUANTITATIVE  LACTIC ACID, PLASMA  TYPE AND SCREEN  TROPONIN I (HIGH SENSITIVITY)    EKG EKG Interpretation  Date/Time:  Monday July 28 2021 07:17:05 EST Ventricular Rate:  85 PR Interval:  148 QRS Duration: 224 QT Interval:  527 QTC Calculation: 627 R Axis:   101 Text Interpretation: Sinus rhythm Atrial premature complexes RBBB and LPFB ST-t wave abnormality Abnormal ECG Confirmed by Carmin Muskrat [phone]) on 07/28/2021 7:23:02 AM  Radiology CT Head Wo Contrast  Result Date: 07/28/2021 CLINICAL DATA:  Seizure, nontraumatic (Age >= 23y) Mental status change, unknown cause EXAM: CT HEAD WITHOUT CONTRAST TECHNIQUE: Contiguous axial images were obtained from the base of the skull through the vertex without intravenous contrast. COMPARISON:  12/22/2020 FINDINGS: Brain: There is no acute intracranial hemorrhage, mass effect, or edema. Gray-white differentiation is preserved. There is no extra-axial fluid collection. Ventricles and sulci are stable in size and configuration. Patchy hypoattenuation in the supratentorial white matter is nonspecific but probably reflects stable chronic microvascular ischemic changes. 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 Vascular: There is atherosclerotic calcification at the skull base. Skull: Calvarium is unremarkable.  Sinuses/Orbits: No acute finding. Other: None. IMPRESSION: No acute intracranial abnormality. Stable chronic findings detailed above. Electronically Signed   By: Macy Mis M.D.   On: 07/28/2021 08:16   DG Chest Port 1 View  Result Date: 07/28/2021 CLINICAL DATA:  Fatigue and dialysis.  Chest pain EXAM: PORTABLE CHEST 1 VIEW COMPARISON:  01/01/2021 FINDINGS: Cardiomegaly.  CABG.  Stable aortic and hilar contours. Artifact from EKG leads. There is no edema, consolidation, effusion, or pneumothorax. Left subclavian stenting IMPRESSION: No evidence of acute disease. Chronic cardiomegaly. Electronically Signed   By: Jorje Guild M.D.   On: 07/28/2021 07:51    Procedures Procedures   Medications Ordered in ED Medications  LORazepam (ATIVAN) injection 2 mg (has no administration in time range)  furosemide (LASIX) injection 40 mg (40 mg Intravenous Given 07/28/21 1001)  sodium chloride 0.9 % bolus 500 mL (0 mLs Intravenous Stopped 07/28/21 1034)  sodium zirconium cyclosilicate (LOKELMA) packet 10 g (10 g Oral Given 07/28/21 1002)  calcium gluconate 1 g/ 50 mL sodium chloride IVPB (0 mg Intravenous Stopped 07/28/21 1034)  albuterol (PROVENTIL) (2.5 MG/3ML) 0.083% nebulizer solution 10 mg (10 mg Nebulization Given 07/28/21 1010)  insulin aspart (novoLOG) injection 5 Units (5 Units Intravenous Given 07/28/21 0948)    And  dextrose 50 % solution 50 mL (50 mLs Intravenous Given 07/28/21 0949)  sodium bicarbonate injection 50 mEq (50 mEq Intravenous Given 07/28/21 0954)    ED Course  I have reviewed the triage vital signs and the nursing notes.  Pertinent labs & imaging results that were available during my  care of the patient were reviewed by me and considered in my medical decision making (see chart for details).  With report of unresponsiveness seizure-like activity in a dialysis patient he had broadLabs, CT, x-ray ordered after arrival.  With initial consideration of postictal phase 2  seizure pads were placed, Ativan as needed ordered.  Update: Patient now accompanied by wife, additional details of HPI provided above. I discussed this case with our cardiology team givenElevated troponin, and on chart review to clear the patient has had an NSTEMI recently, catheterization in the past year, with multiple areas of lesion, no interventions recently.  Patient has received calcium, insulin, dextrose, fluids, Lasix, has had decrease in QRS widening. I discussed patient's case with ourUpdate: QRS now narrow, but with potassium greater than 7, EKG changes as above, will require dialysis today.  I discussed this case with our internal medicine colleagues who will admit the patient during the hospitalization, with cardiology and nephrology following is consulting teams.    MDM Rules/Calculators/A&P MDM Number of Diagnoses or Management Options Delirium: new, needed workup Hyperkalemia: new, needed workup Wide QRS ventricular tachycardia: new, needed workup   Amount and/or Complexity of Data Reviewed Clinical lab tests: ordered and reviewed Tests in the radiology section of CPT: ordered and reviewed Tests in the medicine section of CPT: reviewed and ordered Decide to obtain previous medical records or to obtain history from someone other than the patient: yes Obtain history from someone other than the patient: yes Review and summarize past medical records: yes Discuss the patient with other providers: yes Independent visualization of images, tracings, or specimens: yes  Risk of Complications, Morbidity, and/or Mortality Presenting problems: high Diagnostic procedures: high Management options: high  Critical Care Total time providing critical care: 30-74 minutes (45)  Patient Progress Patient progress: improved   Final Clinical Impression(s) / ED Diagnoses Final diagnoses:  Delirium  Hyperkalemia  Wide QRS ventricular tachycardia     Carmin Muskrat,  MD 07/28/21 1601

## 2021-07-28 NOTE — H&P (Addendum)
NAME:  HERBIE LEHRMANN, MRN:  [id number], DOB:  Feb 05, 1956, LOS: 0 ADMISSION DATE:  07/28/2021, Primary: [name], MD  CHIEF COMPLAINT:  shortness of breath   Medical Service: Internal Medicine Teaching Service         Attending Physician: Dr. Velna Ochs, MD    First Contact: Dr. Raymondo Band Pager: [phone]  Second Contact: Dr. Alfonse Spruce Pager: [phone]       After Hours (After 5p/  First Contact Pager: [phone]  weekends / holidays): Second Contact Pager: [phone]    History of present illness   65 year old male with end-stage renal disease on dialysis, ischemic cardiomyopathy, Toxoplasma uveitis, and hypertension who was transferred to [facility] emergency department earlier this morning from his dialysis center for shortness of breath and weakness and was found to have severe hyperkalemia to 7.5 with EKG changes.  While transferring from the EMS stretcher to the bed, patient was noted to undergo a seizure which was witnessed by nurses and ED staff.   History of was obtained from the patient and his wife, who was present at bedside, with the use of video interpreter.  Patient attends dialysis at [facility] Monday Wednesday Friday.  He and his wife report compliance, no recent missed sessions.  Last HD session was on Friday, 11/11.  It is unclear if they were able to pull off normal amount of fluid or not.  His wife notes that they have had difficulty with this in the past due to him developing shortness of breath and symptomatic hypotension.  Over the weekend, he felt progressively more weak and experienced general malaise.  His wife notes that he was unable to stand even get on the table for dialysis this morning. Of note, he had been placed on Lokelma from October for up until about 3 weeks ago.  She says that the dialysis center told them that his potassium was better and he did not need to continue taking it.  Additionally, patient notes progressive headaches over the last 3 months  that occur at least once if not twice a day.  I was unable to elicit the duration of these headaches.  Headaches occur in the back of his head and cervical spine.  They are associated with either blurry vision or occasionally complete loss of vision and light sensitivity.  He endorses neck pain.  He has been following with orthopedics for management of thoracic lumbar pain. He initially denied a prior history of seizures however after further questioning, his wife notes that she has been observing a few episodes where he will stare off into the distance and open and close his jaw, becoming unresponsive.  These episodes have been going on for about 6 months.  They last less than a minute typically.  She is unsure if she loses bowel or bladder control during these but he is typically confused for a while after this happens.  Additionally, patient notes that he has been experiencing progressive shortness of breath over the past 6 months at least.  He attributes this to his cardiomyopathy.  Over the past 3 weeks, he has developed a cough productive of yellow phlegm.  He denies associated fever.  He endorses orthopnea but denies lower extremity swelling.  Additionally, he tells me that he he has been experiencing hematochezia since at least April.  Over the last few weeks he has developed diarrhea which is new for him.  He denies abdominal pain or cramping.  He tells me that back in  April he underwent endoscopy however does not know what the results were. He is also been having roughly a 3-week history of hematuria with passage of clots.  He endorses urinary incontinence for the last 3 weeks as well.  He denies suprapubic pain or flank pain.  No dysuria.  Further chart review, it appears that he is seen by infectious disease at [facility] for uveitis, presumably toxoplasma for which he was started on Bactrim however did not tolerate this due to dizziness subsequently transitioned to atovaquone.  At his last  office visit on 11/1, it sounds like he had experienced significant improvement in the vision of his left eye. 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  He has been resistant to the idea of undergoing treatment for latent TB however they plan to readdress this if his uveitis had not improved with atovaquone.  He follows with Dr. Aundra Dubin in the advanced heart failure clinic for management of ischemic cardiomyopathy.   2017-admitted for NSTEMI and found to have 3VD. Underwent CABGx3. Hospitalization was complicated by kidney disease progression to ESRD.  2018-admitted for NSTEMI found to have occlusion disease in SVG-OMI and subsequently underwent DES. 2019-admitted with NSTEMI found to have stent reocclusion, underwent stenting. 2022-underwent left and right heart cath. No significant changes noted on LHC.  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   Home Medications     Prior to Admission medications   Medication Sig Start Date End Date Taking? Authorizing Provider  acetaminophen (TYLENOL) 500 MG tablet Take 1,000 mg by mouth every 6 (six) hours as needed for mild pain or headache.     [provider]  apixaban (ELIQUIS) 5 MG TABS tablet Take 1 tablet (5 mg total) by mouth 2 (two) times daily. 01/28/21   Larey Dresser, MD  Ascorbic Acid (VITAMIN C) 1000 MG tablet Take 1,000 mg by mouth daily.    [provider]  atorvastatin (LIPITOR) 80 MG tablet Take 1 tablet by mouth once daily 07/08/21   Larey Dresser, MD  calcium acetate (PHOSLO) 667 MG capsule Take 667 mg by mouth 3 (three) times daily with meals.  07/09/17   [provider]  citalopram (CELEXA) 20 MG tablet Take 20 mg by mouth daily with lunch.  02/25/17   [provider]  ezetimibe (ZETIA) 10 MG tablet Take 10 mg by mouth daily.    [provider]  gabapentin (NEURONTIN) 100 MG capsule Take 1 capsule (100 mg total) by mouth at bedtime. 01/04/21   Geradine Girt, DO  glucose blood (CONTOUR NEXT TEST) test strip Test BS 5 times a day 03/04/21   [provider]  hydrALAZINE (APRESOLINE) 100 MG tablet Take 1 tablet (100 mg total) by mouth 3 (three) times daily. DO NOT TAKE AM AND NOON DOSES ON HD DAYS 07/10/21   Larey Dresser, MD  insulin glargine, 2 Unit Dial, ([place])  300 UNIT/ML Solostar Pen Inject into the skin. 04/02/21   [provider]  Insulin Human (INSULIN PUMP) SOLN Inject into the skin continuous. NOVOLOG    [provider]  isosorbide mononitrate (IMDUR) 60 MG 24 hr tablet Take 1 tablet (60 mg total) by mouth daily. DO NOT TAKE ON HD DAYS 07/10/21   Larey Dresser, MD  losartan (COZAAR) 25 MG tablet TAKE 1 TABLET BY MOUTH AT BEDTIME 07/08/21   Larey Dresser, MD  Methoxy PEG-Epoetin Beta (MIRCERA IJ) Mircera 03/17/21 03/16/22  [provider]  NOVOLOG 100 UNIT/ML injection Inject 6-10 Units into the skin See admin instructions. Starts with 10 units per day and adds 6 units with each meal via pump 01/08/19   [provider]  pantoprazole (PROTONIX) 40 MG tablet Take 40 mg by mouth daily before lunch.  01/07/17   [provider]  sodium zirconium cyclosilicate (LOKELMA) 10 g PACK packet Take 10 g by mouth See admin instructions. Takes on Tues, Thur, Sat only.    [provider]  vitamin B-12 (CYANOCOBALAMIN) 500 MCG tablet Take 1 tablet (500 mcg total) by mouth daily. 01/05/21   Geradine Girt, DO    Allergies    Allergies as of 07/28/2021   (No Known Allergies)   Social History   reports that he quit smoking about 8 years ago. His smoking use included cigarettes. He has a 52.50 pack-year smoking history. He has never used smokeless tobacco. He reports that he does not drink alcohol and does not use drugs.  Resides at home with his wife who assist him with ADLs. Family History   His family history includes CAD in his father; Colon cancer in his father; Diabetes in his brother.   ROS  10 point review of systems negative unless stated in the HPI.  Objective   Blood pressure (!) 136/58, pulse 73, temperature 97.7 F (36.5 C), temperature source Temporal, resp. rate (!) 22, SpO2 99 %.    General: Acutely ill-appearing male lying in bed HENT: Moist mucous membranes, hearing intact Eyes: Conjunctival hemorrhage present on the left.  No drainage Cardiac: Regular rate and rhythm, JVD present, no lower extremity edema Pulm: Breathing fairly comfortably on 4 L supplemental oxygen.  Becomes short of breath with prolonged speaking.  Lung sounds are diminished throughout GI: Abdomen is soft, nontender, nondistended MSK: Pain with neck range of motion.  Normal muscle bulk and tone Skin: No rash or lesion on limited exam Neurologic exam Mental status: Lethargic.  Slowly woke up throughout the exam oriented to person, place, time and situation Speech: No dysarthria Cranial nerves: Pupils are equal round and reactive to light.  No facial droop.  Tongue and uvula are midline. Motor: Diffuse weakness in the extremities.  Exam limited by patient fatigue Gait: Deferred  Significant Diagnostic Tests:    EKG Sinus rhythm, regular rate. PR prolonged 135msec. QRS prolongation 224. Qtc 647. Peaked t waves, RBBB  Chest x-ray No pulmonary edema or focal infiltrate, cardiomegaly present  CTH No acute findings.  No evidence of intracranial hemorrhage or edema.  Patchy hypoattenuation in the supratentorial white matter nonspecific but probably reflects stable chronic microvascular ischemic changes.  Chronic small cortical infarct in the right precentral gyrus Labs    CBC Latest Ref Rng & Units 07/28/2021 04/03/2021 03/06/2021  WBC 4.0 - 10.5 K/uL 7.3 6.3 -  Hemoglobin 13.0 - 17.0 g/dL 7.8(L) 10.9(L) 12.6(L)  Hematocrit 39.0 - 52.0 % 25.5(L) 32.9(L) 37.0(L)  Platelets  150 - 400 K/uL 210 153 -   BMP Latest Ref Rng & Units 07/28/2021 04/08/2021 04/03/2021  Glucose 70 - 99 mg/dL 213(H) 135(H) 160(H)  BUN 8 - 23 mg/dL 56(H) 32(H) 31(H)  Creatinine 0.61 - 1.24 mg/dL 10.57(H) 6.99(H) 6.63(H)  Sodium 135 - 145 mmol/L 132(L) 135 134(L)  Potassium 3.5 - 5.1 mmol/L >7.5(HH) 5.2(H) 6.1(H)  Chloride 98 - 111 mmol/L 92(L) 93(L) 89(L)  CO2 22 - 32 mmol/L 24 31 32  Calcium 8.9 - 10.3 mg/dL 9.3 8.2(L) 8.9    Summary  63 yom with ESRD on HD, CHF, and toxoplasma uveitis who was admitted to IMTS 07/28/21 for severe hyperkalemia and seizure workup.   Assessment & Plan:  Active Problems:   Acute respiratory failure (HCC)  Acute hypoxic respiratory failure No pulmonary edema or focal infiltrates on chest x-ray.  Question aspiration pneumonitis secondary to seizures versus hypervolemia.  Supplemental oxygen needs decreased to 4 L.  End-stage renal disease on dialysis (MWF) with severe hyperkalemia (>7.5 with EKG changes) Received hyperkalemia treatment in the ED.  Question relation to recent discontinuation of Lokelma. Nephrology consulted, will undergo HD today.   Suspect we will need to restart scheduled lokelma Continuous telemetry monitoring Daily labs  ?Seizure, Headaches Patient was noted to  undergo something that looked like a seizure in the ED.  I spoke with one of the RNs who was present and noted that he had a gaze deviation and jaw clenching during the episode.  Based on his wife's report, it sounds like this may have been going on for the past few months however she was not present at the time of the seizure today. Unclear cause at this time. Hyperkalemia noted but rarely precipitates epileptogenic activity.  He has mild hyponatremia but not significant enough that it should cause seizures.  Bicarb was 24 on the CMP this morning suggesting probably was not acidotic. Given his hypothermia and nuchal rigidity, he may need an LP for meningitis eval. Neurology consulted As needed Ativan for seizures.  Page MD MRI brain with and without contrast EEG N.p.o. for now.  If no seizures by this evening, I would ask for bedside swallow eval and to consider starting a renal diet if he passes  Hypothermia Temperature was 95.7 degrees on arrival.  Otherwise, hemodynamically stable. No leukocytosis, lactate normal. Does not meet sepsis criteria however primary concern would be meningitis given his history and exam findings. Start vancomycin, cefepime, ampicillan empirically Appreciate neurology recs  Acute on chronic anemia Subjective hematochezia x7 mo, subjective hematuria with urinary incontinence x19mo. Hemoglobin is down ~3g from three months ago, down 1g from 2 weeks ago.  He underwent an EGD in April 2022, due to GI bleeding, findings consistent with gastroduodenitis but no source of the bleed was found.  Colonoscopy was performed during that admission as well and showed,  a large amount of blood in the bowels however no source was identified. Several nonbleeding hemorrhoids and a polyp (1 mm) were noted.  F/u capsule endoscopy was unrevealing.  Check stool occult Monitor hemoglobin. No active ACS. Transfusion for hemoglobin <7 Check UA Consider GI consult prior to discharge once patient  becomes more stable. Hold Eliquis.  Uveitis (presumably toxoplasmosis) Currently undergoing treatment for this at [facility].  Did not tolerate Bactrim and was subsequently switched to atovaquone. Positive QuantiFERON.  He has been following with infectious disease for this as well.  No cavitary lesions are appreciable on chest x-ray.  I suspect that the cough  that he has been having is attributable to his volume status however we will place him in airborne precautions for now. Spoke with Dr. Graylon Good from [facility] regarding consult.  Appreciate ID recommendations on IV antibiotic option for the uveitis given that he is n.p.o. for seizure prophylaxis.  Can likely transition him back to the atovaquone if he passes a swallow eval tomorrow. Airborne precautions   Ischemic cardiomyopathy with preserved EF Status post CABG x3 in 2017, status post stent placement 1610, complicated by stent occlusion in 2019 requiring stent placement No active chest pain today.  Troponins are elevated but decreasing 583>> 492.  Low suspicion for ACS. Volume status primarily managed through HD.  BNP is around his baseline of 1900. Atrial fibrillation (CHADSVASC 3) May resume home medications if no seizure activity noted this evening. Cardiology was consulted in the ED for the troponinemia and EKG findings Holding eliquis. May need to address long term risks vs benefits if he continues to experience significant GI bleeding  Best practice:  CODE STATUS: Full DVT for prophylaxis: Hold for active GI bleed Social considerations/Family communication: Wife present at bedside Dispo: Admit patient to Inpatient with expected length of stay greater than 2 midnights.   Mitzi Hansen, MD Internal Medicine Resident PGY-3 [facility] Pager: [phone] 07/28/2021 12:32 PM

## 2021-07-28 NOTE — Consult Note (Signed)
Neurology Consultation  Reason for Consult: Seizure-like activity Referring Physician: Dr. Philipp Ovens  CC: Dizziness  History is obtained from: Patient, Chart review  HPI: Kenneth [name] is a 65 y.o. male with a medical history significant for coronary artery disease, NSTEMI 05/31/2017 s/p CABG x3 (2017), NSTEMI s/p DES (2018), NSTEMI with stent reocclusion s/p stenting (2019), CHF/ICM, type 1 diabetes mellitus, end-stage renal disease on hemodialysis MWF and he was last dialyzed as scheduled on 07/25/2021, toxoplasma uveitis, hyperlipidemia, essential hypertension, and paroxysmal atrial fibrillation who presented to the ED 11/14 for evaluation of shortness of breath and weakness. On ED arrival patient was lethargic and was found to have severe hyperkalemia of > 7.5 with EKG changes and a witnessed seizure by ED staff when transferring from stretcher to hospital bed for HD. Of note, the patient had previously been on [facility] since October and this was discontinued 2-3 weeks PTA since his potassium had normalized. Patient states that he was not feeling well over the weekend with dizziness, general malaise, and weakness and spent the weekend resting but he was unable to stand to get onto the shuttle for HD this morning and had to be carried onto the shuttle. Once at HD, the staff noticed that he could not stand to obtain a weight and activated EMS for transfer to the hospital. In addition to generalized weakness and shortness of breath over the weekend, the patient has had hematochezia since April, diarrhea over the last few weeks, and 3 weeks of hematuria with passage of clots with associated incontinence. On chart review, patient has complained of 3 months of daily, posterior headache with cervical spine pain and visual disturbance, however, he did not endorse these symptoms on neurology interview.   Patient denies history of seizures, however, on chart review, patient's wife has endorses observing a few  episodes over the past 6 months where the patient stared off into the distance with jaw opening and closing with a subsequent unresponsive state for less than one minute followed by confusion.   Per Dr. Chase Picket note 11/14: "Further chart review, it appears that he is seen by infectious disease at [facility] for uveitis, presumably toxoplasma for which he was started on Bactrim however did not tolerate this due to dizziness subsequently transitioned to atovaquone.  At his last office visit on 11/1, it sounds like he had experienced significant improvement in the vision of his left eye. 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 He has been resistant to the idea of undergoing treatment for latent TB however they plan to readdress this if his uveitis had not improved with atovaquone."  ROS: A complete ROS was performed and is negative except as noted in the HPI.   Past Medical History:  Diagnosis Date   Acute respiratory failure with hypoxemia ([place]) 05/31/2016   CAD (coronary artery disease) 10/30/2016   NSTEMI 9/17 with CABG x 3 (LIMA-LAD, SVG-OM, SVG-RCA).   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   type 1 DM   ESRD on hemodialysis ([place])    "MWF; [place]" (03/30/2018)   Gastroesophageal reflux disease 08/18/2016   HCAP (healthcare-associated pneumonia)    Hyperlipidemia    Hypertension    Hypertensive heart disease 09/12/2017   Ischemic rest pain of lower extremity 02/06/2015   Keratoma 02/27/2015  Metatarsal deformity 02/27/2015   NSTEMI (non-ST elevated myocardial infarction) (HCC)    PAF (paroxysmal atrial fibrillation) (HCC)    Pleural effusion    Pronation deformity of both feet 02/27/2015   Past Surgical History:  Procedure Laterality Date   A/V FISTULAGRAM Left 03/06/2021   Procedure: A/V FISTULAGRAM;  Surgeon:  Marty Heck, MD;  Location: [place] [facility];  Service: Cardiovascular;  Laterality: Left;   AV FISTULA PLACEMENT Left 06/22/2016   Procedure: ARTERIOVENOUS (AV) FISTULA CREATION LEFT UPPER ARM;  Surgeon: Rosetta Posner, MD;  Location: [place];  Service: Vascular;  Laterality: Left;   BIOPSY  12/19/2020   Procedure: BIOPSY;  Surgeon: Ladene Artist, MD;  Location: [place];  Service: Endoscopy;;   CARDIAC CATHETERIZATION N/A 05/31/2016   Procedure: Left Heart Cath and Coronary Angiography;  Surgeon: Jettie Booze, MD;  Location: [place] [facility];  Service: Cardiovascular;  Laterality: N/A;   CARDIAC CATHETERIZATION N/A 05/31/2016   Procedure: Right Heart Cath;  Surgeon: Jettie Booze, MD;  Location: [place] [facility];  Service: Cardiovascular;  Laterality: N/A;   CARDIAC CATHETERIZATION N/A 05/31/2016   Procedure: IABP Insertion;  Surgeon: Jettie Booze, MD;  Location: [place] [facility];  Service: Cardiovascular;  Laterality: N/A;   COLONOSCOPY WITH PROPOFOL N/A 12/20/2020   Procedure: COLONOSCOPY WITH PROPOFOL;  Surgeon: Mauri Pole, MD;  Location: [place] [facility];  Service: Endoscopy;  Laterality: N/A;   CORONARY ARTERY BYPASS GRAFT N/A 06/05/2016   Procedure: CORONARY ARTERY BYPASS GRAFTING (CABG) x3 LIMA to LAD -SVG to OM -SVG to RCA;  Surgeon: Ivin Poot, MD;  Location: [place];  Service: Open Heart Surgery;  Laterality: N/A;   CORONARY STENT INTERVENTION N/A 07/07/2017   Procedure: CORONARY STENT INTERVENTION;  Surgeon: Wellington Hampshire, MD;  Location: [place] [facility];  Service: Cardiovascular;  Laterality: N/A;   CORONARY STENT INTERVENTION N/A 03/30/2018   Procedure: CORONARY STENT INTERVENTION;  Surgeon: Martinique, Peter M, MD;  Location: [place] [facility];  Service: Cardiovascular;  Laterality: N/A;   ESOPHAGOGASTRODUODENOSCOPY (EGD) WITH PROPOFOL N/A 12/19/2020   Procedure: ESOPHAGOGASTRODUODENOSCOPY (EGD) WITH PROPOFOL;  Surgeon: Ladene Artist, MD;   Location: [facility] ENDOSCOPY;  Service: Endoscopy;  Laterality: N/A;   GIVENS CAPSULE STUDY N/A 12/20/2020   Procedure: GIVENS CAPSULE STUDY;  Surgeon: Mauri Pole, MD;  Location: [place];  Service: Endoscopy;  Laterality: N/A;   INSERTION OF DIALYSIS CATHETER N/A 06/05/2016   Procedure: INSERTION OF DIALYSIS/trialysis CATHETER;  Surgeon: Ivin Poot, MD;  Location: [place];  Service: Vascular;  Laterality: N/A;   INSERTION OF DIALYSIS CATHETER Right 06/13/2016   Procedure: INSERTION OF DIALYSIS CATHETER RIGHT INTERNAL JUGULAR;  Surgeon: Conrad Hollister, MD;  Location: [place];  Service: Vascular;  Laterality: Right;   INSERTION OF DIALYSIS CATHETER Right 09/07/2019   Procedure: Insertion Of Dialysis Catheter;  Surgeon: Rosetta Posner, MD;  Location: [facility];  Service: Vascular;  Laterality: Right;   INTRAOPERATIVE TRANSESOPHAGEAL ECHOCARDIOGRAM N/A 06/05/2016   Procedure: INTRAOPERATIVE TRANSESOPHAGEAL ECHOCARDIOGRAM;  Surgeon: Ivin Poot, MD;  Location: [place];  Service: Open Heart Surgery;  Laterality: N/A;   LEFT HEART CATH AND CORS/GRAFTS ANGIOGRAPHY N/A 11/03/2016   Procedure: Left Heart Cath and Cors/Grafts Angiography;  Surgeon: [place] Sine, MD;  Location: [place] [facility];  Service: Cardiovascular;  Laterality: N/A;   LEFT HEART CATH AND CORS/GRAFTS ANGIOGRAPHY N/A 07/07/2017   Procedure: LEFT HEART CATH AND CORS/GRAFTS ANGIOGRAPHY;  Surgeon:  Larey Dresser, MD;  Location: [place] [facility];  Service: Cardiovascular;  Laterality: N/A;   LEFT HEART CATH AND CORS/GRAFTS ANGIOGRAPHY N/A 03/30/2018   Procedure: LEFT HEART CATH AND CORS/GRAFTS ANGIOGRAPHY;  Surgeon: Martinique, Peter M, MD;  Location: [place] [facility];  Service: Cardiovascular;  Laterality: N/A;   LEFT HEART CATH AND CORS/GRAFTS ANGIOGRAPHY N/A 04/17/2019   Procedure: LEFT HEART CATH AND CORS/GRAFTS ANGIOGRAPHY;  Surgeon: Larey Dresser, MD;  Location: [place] [facility];  Service: Cardiovascular;  Laterality: N/A;    PERIPHERAL VASCULAR INTERVENTION Left 03/06/2021   Procedure: PERIPHERAL VASCULAR INTERVENTION;  Surgeon: Marty Heck, MD;  Location: [place] [facility];  Service: Cardiovascular;  Laterality: Left;   POLYPECTOMY  12/20/2020   Procedure: POLYPECTOMY;  Surgeon: Mauri Pole, MD;  Location: [place];  Service: Endoscopy;;   REVISION OF ARTERIOVENOUS GORETEX GRAFT Left 66/44/0347   Procedure: PLICATION OF ANEURYSM OF ARTERIOVENOUS FISTULA  LEFT ARM;  Surgeon: Rosetta Posner, MD;  Location: [place];  Service: Vascular;  Laterality: Left;   REVISON OF ARTERIOVENOUS FISTULA Left 09/07/2019   Procedure: REVISON OF LEFT UPPER ARM ARTERIOVENOUS FISTULA;  Surgeon: Rosetta Posner, MD;  Location: [place];  Service: Vascular;  Laterality: Left;   RIGHT/LEFT HEART CATH AND CORONARY ANGIOGRAPHY N/A 01/03/2021   Procedure: RIGHT/LEFT HEART CATH AND CORONARY ANGIOGRAPHY;  Surgeon: Larey Dresser, MD;  Location: [place] [facility];  Service: Cardiovascular;  Laterality: N/A;   Family History  Problem Relation Age of Onset   CAD Father    Colon cancer Father    Diabetes Brother    Social History:   reports that he quit smoking about 8 years ago. His smoking use included cigarettes. He has a 52.50 pack-year smoking history. He has never used smokeless tobacco. He reports that he does not drink alcohol and does not use drugs.  Medications  Current Facility-Administered Medications:    ampicillin (OMNIPEN) 2 g in sodium chloride 0.9 % 100 mL IVPB, 2 g, Intravenous, Q12H, Christian, Rylee, MD   Chlorhexidine Gluconate Cloth 2 % PADS 6 each, 6 each, Topical, Q0600, Christian, Rylee, MD   [START ON 07/30/2021] cinacalcet (SENSIPAR) tablet 60 mg, 60 mg, Oral, Q M,W,F-HD, Roney Jaffe, MD   Derrill Memo ON 07/30/2021] doxercalciferol (HECTOROL) injection 2 mcg, 2 mcg, Intravenous, Q M,W,F-HD, Roney Jaffe, MD   insulin aspart (novoLOG) injection 0-9 Units, 0-9 Units, Subcutaneous, Q4H, Christian, Rylee,  MD   LORazepam (ATIVAN) injection 2 mg, 2 mg, Intravenous, Q4H PRN, Darrick Meigs, Rylee, MD  Current Outpatient Medications:    acetaminophen (TYLENOL) 500 MG tablet, Take 1,000 mg by mouth every 6 (six) hours as needed for mild pain or headache. , Disp: , Rfl:    apixaban (ELIQUIS) 5 MG TABS tablet, Take 1 tablet (5 mg total) by mouth 2 (two) times daily., Disp: 60 tablet, Rfl: 6   Ascorbic Acid (VITAMIN C) 1000 MG tablet, Take 1,000 mg by mouth daily., Disp: , Rfl:    atorvastatin (LIPITOR) 80 MG tablet, Take 1 tablet by mouth once daily, Disp: 90 tablet, Rfl: 0   calcium acetate (PHOSLO) 667 MG capsule, Take 667 mg by mouth 3 (three) times daily with meals. , Disp: , Rfl:    citalopram (CELEXA) 20 MG tablet, Take 20 mg by mouth daily with lunch. , Disp: , Rfl: 1   ezetimibe (ZETIA) 10 MG tablet, Take 10 mg by mouth daily., Disp: , Rfl:    gabapentin (NEURONTIN) 100 MG capsule,  Take 1 capsule (100 mg total) by mouth at bedtime., Disp: , Rfl:    glucose blood (CONTOUR NEXT TEST) test strip, Test BS 5 times a day, Disp: , Rfl:    hydrALAZINE (APRESOLINE) 100 MG tablet, Take 1 tablet (100 mg total) by mouth 3 (three) times daily. DO NOT TAKE AM AND NOON DOSES ON HD DAYS, Disp: , Rfl:    insulin glargine, 2 Unit Dial, (TOUJEO MAX SOLOSTAR) 300 UNIT/ML Solostar Pen, Inject into the skin., Disp: , Rfl:    Insulin Human (INSULIN PUMP) SOLN, Inject into the skin continuous. NOVOLOG, Disp: , Rfl:    isosorbide mononitrate (IMDUR) 60 MG 24 hr tablet, Take 1 tablet (60 mg total) by mouth daily. DO NOT TAKE ON HD DAYS, Disp: , Rfl:    losartan (COZAAR) 25 MG tablet, TAKE 1 TABLET BY MOUTH AT BEDTIME, Disp: 30 tablet, Rfl: 0   Methoxy PEG-Epoetin Beta (MIRCERA IJ), Mircera, Disp: , Rfl:    NOVOLOG 100 UNIT/ML injection, Inject 6-10 Units into the skin See admin instructions. Starts with 10 units per day and adds 6 units with each meal via pump, Disp: , Rfl:    pantoprazole (PROTONIX) 40 MG tablet, Take 40 mg  by mouth daily before lunch. , Disp: , Rfl: 0   sodium zirconium cyclosilicate (LOKELMA) 10 g PACK packet, Take 10 g by mouth See admin instructions. Takes on Tues, Thur, Sat only., Disp: , Rfl:    vitamin B-12 (CYANOCOBALAMIN) 500 MCG tablet, Take 1 tablet (500 mcg total) by mouth daily., Disp: 30 tablet, Rfl: 0  Exam: Current vital signs: BP (!) 151/63   Pulse 73   Temp 97.7 F (36.5 C) (Temporal)   Resp (!) 22   Wt 64.9 kg   SpO2 99%   BMI 23.09 kg/m  Vital signs in last 24 hours: Temp:  [95.7 F (35.4 C)-98.1 F (36.7 C)] 97.7 F (36.5 C) (11/14 1217) Pulse Rate:  [58-90] 73 (11/14 1500) Resp:  [12-23] 22 (11/14 1238) BP: (136-181)/(55-88) 151/63 (11/14 1500) SpO2:  [97 %-100 %] 99 % (11/14 1217) Weight:  [64.9 kg] 64.9 kg (11/14 1500)  GENERAL: Drowsy, wakes to voice, in HD, in no acute distress Psych: Affect is flat, patient is calm and cooperative with examination Head: Normocephalic and atraumatic, without obvious abnormality EENT: Normal conjunctivae, dry mucous membranes, no OP obstruction LUNGS: Normal respiratory effort. Non-labored breathing on room air CV: Regular rate on telemetry, no pedal edema ABDOMEN: Soft, non-tender, non-distended Extremities: warm, well perfused, without obvious deformity  NEURO:  Mental Status: Awake, alert, and oriented to person, place, time, and situation. He is able to provide a clear and coherent history of present illness. Speech/Language: speech is intact without dysarthria with use of a telephone interpreter Naming, fluency, and comprehension intact without aphasia. Cranial Nerves:  II: PERRL. Visual fields full.  III, IV, VI: EOMI without ptosis, gaze preference, or nystagmus V: Sensation is intact to light touch and symmetrical to face. VII: Face is symmetric resting and with movement.  VIII: Hearing is intact to voice IX, X: Palate elevation is symmetric. Phonation normal.  XI: Normal sternocleidomastoid and trapezius  muscle strength XII: Tongue protrudes midline without fasciculations.   Motor: 5/5 strength present in bilateral upper extremities.  Bilateral lower extremity weakness present with minimal vertical drift on the right and drift to bed on the left lower extremity.  Tone is normal. Bulk is normal.  Sensation: Decreased sensation to light touch on the left upper extremity with  reported equal sensation to light touch on the face and bilateral lower extremities but with extinction to DDS on the left lower extremity.  Coordination: Some difficulty with following directions with use of interpreter but within that limitation, there is no obvious dysmetria of the upper or lower extremities.  DTRs: 2+ throughout.  Gait: Deferred  Labs I have reviewed labs in epic and the results pertinent to this consultation are: CBC    Component Value Date/Time   WBC 7.3 07/28/2021 0724   RBC 2.56 (L) 07/28/2021 0724   HGB 7.8 (L) 07/28/2021 0724   HCT 25.5 (L) 07/28/2021 0724   PLT 210 07/28/2021 0724   MCV 99.6 07/28/2021 0724   MCH 30.5 07/28/2021 0724   MCHC 30.6 07/28/2021 0724   RDW 18.0 (H) 07/28/2021 0724   LYMPHSABS 1.0 07/28/2021 0724   MONOABS 0.6 07/28/2021 0724   EOSABS 0.1 07/28/2021 0724   BASOSABS 0.1 07/28/2021 0724   CMP     Component Value Date/Time   NA 132 (L) 07/28/2021 0724   K >7.5 (HH) 07/28/2021 0724   CL 92 (L) 07/28/2021 0724   CO2 24 07/28/2021 0724   GLUCOSE 213 (H) 07/28/2021 0724   BUN 56 (H) 07/28/2021 0724   CREATININE 10.57 (H) 07/28/2021 0724   CALCIUM 9.3 07/28/2021 0724   PROT 6.7 07/28/2021 0724   ALBUMIN 3.9 07/28/2021 0724   AST 44 (H) 07/28/2021 0724   ALT 27 07/28/2021 0724   ALKPHOS 77 07/28/2021 0724   BILITOT 0.6 07/28/2021 0724   GFRNONAA 5 (L) 07/28/2021 0724   GFRAA 8 (L) 12/12/2019 1010   Lipid Panel     Component Value Date/Time   CHOL 100 07/10/2021 1101   TRIG 31 07/10/2021 1101   HDL 46 07/10/2021 1101   CHOLHDL 2.2 07/10/2021 1101    VLDL 6 07/10/2021 1101   LDLCALC 48 07/10/2021 1101  BNP    Component Value Date/Time   BNP 1,925.2 (H) 07/28/2021 0724   Lab Results  Component Value Date   CKTOTAL 138 11/02/2016   CKMB 12.2 (H) 11/02/2016   TROPONINI 1.13 (HH) 03/31/2018    Ref. Range 07/28/2021 07:24  Magnesium Latest Ref Range: 1.7 - 2.4 mg/dL 3.1 (H)   Urinalysis    Component Value Date/Time   COLORURINE YELLOW 10/30/2016 2339   APPEARANCEUR CLEAR 10/30/2016 2339   LABSPEC 1.010 10/30/2016 2339   PHURINE 5.0 10/30/2016 2339   GLUCOSEU NEGATIVE 10/30/2016 2339   HGBUR NEGATIVE 10/30/2016 2339   BILIRUBINUR NEGATIVE 10/30/2016 2339   KETONESUR NEGATIVE 10/30/2016 2339   PROTEINUR 100 (A) 10/30/2016 2339   NITRITE NEGATIVE 10/30/2016 2339   LEUKOCYTESUR NEGATIVE 10/30/2016 2339   Drugs of Abuse     Component Value Date/Time   LABOPIA POSITIVE (A) 10/30/2016 2344   COCAINSCRNUR NONE DETECTED 10/30/2016 2344   LABBENZ NONE DETECTED 10/30/2016 2344   AMPHETMU NONE DETECTED 10/30/2016 2344   THCU NONE DETECTED 10/30/2016 2344   LABBARB NONE DETECTED 10/30/2016 2344    Imaging I have reviewed the images obtained:  CT-scan of the brain 11/14: No acute intracranial abnormality. Stable chronic findings detailed above.  Assessment: 65 y.o. male who presented to the ED for evaluation of shortness of breath and generalized weakness with witnessed seizure activity in the ED and findings of severe hyperkalemia on initial labs with K+ of >7.5. Patient was taken emergently to HD and neurology was consulted for further evaluation.  - Examination reveals drowsy patient during HD session who wakes to  voice and is oriented with left > right lower extremity weakness and decreased sensation to light touch of the left upper extremity compared to the right.  - Patient had witnessed seizure-like activity in the ED with multiple metabolic derangements including hypermagnesemia at 3.1. CXR is without evidence of acute  disease, ammonia level pending, BUN slightly elevated above baseline at 56 (baseline 21-46). Will need infectious work up for further evaluation of seizure provocation, patient has been afebrile without leukocytosis since hospital arrival.  - With new onset seizure, will need routine EEG and MRI brain. Unable to obtain MRI brain with contrast due to patient with ESRD.   Recommendations: - Serum ammonia, magnesium  - Keppra 1 gram once followed by 500 mg daily with 250 mg following dialysis - MRI brain without contrast due to ESRD patient - Routine EEG when able to obtain - Seizure precautions - Urinalysis, UDS pending  Pt seen by NP/Neuro and later by MD. Note/plan to be edited by MD as needed.  Kenneth [name], AGAC-NP [facility] Pager: [phone]  I have seen the patient reviewed the above note.  Per report, he has had staring spells in the past few years, then episodes of shaking for the past few months. She states that he will shake on both sides for about a minute.  Afterwards, he does not have significant postictal confusion, she is not certain of any incontinence, she does not think he bites his tongue.  In the ER, he was witnessed to have what was suspected to be partial seizure activity.  Given this history of spells and the witnessed episode concerning for seizure, I do think it would be reasonable to empirically start antiepileptic therapy at this time.  If he continues to have spells despite this, then further characterization may need to be undertaken, but I feel there is a high enough index of suspicion to warrant therapy at this time.   Recommendations as above.  Kenneth Rack, MD [facility] [phone]  If 7pm- 7am, please page neurology on call as listed in [place].

## 2021-07-28 NOTE — ED Triage Notes (Signed)
BIB [facility] called to diaqlysis for lethargicv. On arrival pale and clammy. Hasn't missed any dialysis. Just not feeling well this weekend.periods of tachypnea. CBG 276, PIV 18ga Rt AC, ASA 324mg , Nitro x 4. Dialysis fistula lt arm.

## 2021-07-28 NOTE — ED Notes (Signed)
During transfer to stretcher pt became unresponsive, gurgling respirations. Appeared to have seizure like activity. Provider made aware at this time and at bedside

## 2021-07-28 NOTE — Progress Notes (Signed)
Patient is OTF at the moment - EEG to be done as schedule permits when patient is in room.

## 2021-07-29 ENCOUNTER — Inpatient Hospital Stay (HOSPITAL_COMMUNITY): Payer: Medicare Other

## 2021-07-29 DIAGNOSIS — I120 Hypertensive chronic kidney disease with stage 5 chronic kidney disease or end stage renal disease: Secondary | ICD-10-CM

## 2021-07-29 DIAGNOSIS — R569 Unspecified convulsions: Secondary | ICD-10-CM

## 2021-07-29 DIAGNOSIS — G40109 Localization-related (focal) (partial) symptomatic epilepsy and epileptic syndromes with simple partial seizures, not intractable, without status epilepticus: Secondary | ICD-10-CM

## 2021-07-29 DIAGNOSIS — I5032 Chronic diastolic (congestive) heart failure: Secondary | ICD-10-CM

## 2021-07-29 DIAGNOSIS — D649 Anemia, unspecified: Secondary | ICD-10-CM

## 2021-07-29 DIAGNOSIS — B5809 Other toxoplasma oculopathy: Secondary | ICD-10-CM

## 2021-07-29 DIAGNOSIS — E875 Hyperkalemia: Secondary | ICD-10-CM

## 2021-07-29 DIAGNOSIS — N186 End stage renal disease: Secondary | ICD-10-CM

## 2021-07-29 DIAGNOSIS — R410 Disorientation, unspecified: Secondary | ICD-10-CM

## 2021-07-29 DIAGNOSIS — E1022 Type 1 diabetes mellitus with diabetic chronic kidney disease: Secondary | ICD-10-CM

## 2021-07-29 DIAGNOSIS — G40209 Localization-related (focal) (partial) symptomatic epilepsy and epileptic syndromes with complex partial seizures, not intractable, without status epilepticus: Secondary | ICD-10-CM

## 2021-07-29 DIAGNOSIS — Z992 Dependence on renal dialysis: Secondary | ICD-10-CM

## 2021-07-29 LAB — RENAL FUNCTION PANEL
Albumin: 3.1 g/dL — ABNORMAL LOW (ref 3.5–5.0)
Anion gap: 12 (ref 5–15)
BUN: 21 mg/dL (ref 8–23)
CO2: 27 mmol/L (ref 22–32)
Calcium: 8.3 mg/dL — ABNORMAL LOW (ref 8.9–10.3)
Chloride: 97 mmol/L — ABNORMAL LOW (ref 98–111)
Creatinine, Ser: 5.46 mg/dL — ABNORMAL HIGH (ref 0.61–1.24)
GFR, Estimated: 11 mL/min — ABNORMAL LOW (ref >60)
Glucose, Bld: 95 mg/dL (ref 70–99)
Phosphorus: 4.7 mg/dL — ABNORMAL HIGH (ref 2.5–4.6)
Potassium: 4.6 mmol/L (ref 3.5–5.1)
Sodium: 136 mmol/L (ref 135–145)

## 2021-07-29 LAB — CBC
HCT: 25.2 % — ABNORMAL LOW (ref 39.0–52.0)
Hemoglobin: 7.8 g/dL — ABNORMAL LOW (ref 13.0–17.0)
MCH: 30.4 pg (ref 26.0–34.0)
MCHC: 31 g/dL (ref 30.0–36.0)
MCV: 98.1 fL (ref 80.0–100.0)
Platelets: 150 10*3/uL (ref 150–400)
RBC: 2.57 MIL/uL — ABNORMAL LOW (ref 4.22–5.81)
RDW: 19.2 % — ABNORMAL HIGH (ref 11.5–15.5)
WBC: 4.3 10*3/uL (ref 4.0–10.5)
nRBC: 0 % (ref 0.0–0.2)

## 2021-07-29 LAB — CBG MONITORING, ED
Glucose-Capillary: 103 mg/dL — ABNORMAL HIGH (ref 70–99)
Glucose-Capillary: 130 mg/dL — ABNORMAL HIGH (ref 70–99)
Glucose-Capillary: 140 mg/dL — ABNORMAL HIGH (ref 70–99)
Glucose-Capillary: 90 mg/dL (ref 70–99)

## 2021-07-29 LAB — BPAM RBC
Blood Product Expiration Date: 202212042359
ISSUE DATE / TIME: 202211142226
Unit Type and Rh: 6200

## 2021-07-29 LAB — TYPE AND SCREEN
ABO/RH(D): A POS
Antibody Screen: NEGATIVE
Unit division: 0

## 2021-07-29 LAB — HEPATITIS B DNA, ULTRAQUANTITATIVE, PCR
HBV DNA SERPL PCR-ACNC: NOT DETECTED IU/mL
HBV DNA SERPL PCR-LOG IU: UNDETERMINED log10 IU/mL

## 2021-07-29 LAB — GLUCOSE, CAPILLARY
Glucose-Capillary: 187 mg/dL — ABNORMAL HIGH (ref 70–99)
Glucose-Capillary: 227 mg/dL — ABNORMAL HIGH (ref 70–99)

## 2021-07-29 LAB — HEPATITIS B SURFACE ANTIBODY, QUANTITATIVE: Hep B S AB Quant (Post): >1000 m[IU]/mL

## 2021-07-29 MED ORDER — INSULIN ASPART 100 UNIT/ML IJ SOLN
0.0000 [IU] | Freq: Three times a day (TID) | INTRAMUSCULAR | Status: DC
  Administered 2021-07-29 – 2021-07-30 (×2): 1 [IU] via SUBCUTANEOUS

## 2021-07-29 MED ORDER — ATORVASTATIN CALCIUM 80 MG PO TABS
80.0000 mg | ORAL_TABLET | Freq: Every day | ORAL | Status: DC
  Administered 2021-07-29 – 2021-07-30 (×2): 80 mg via ORAL
  Filled 2021-07-29: qty 2
  Filled 2021-07-29: qty 1

## 2021-07-29 MED ORDER — EZETIMIBE 10 MG PO TABS
10.0000 mg | ORAL_TABLET | Freq: Every day | ORAL | Status: DC
  Administered 2021-07-29 – 2021-07-30 (×2): 10 mg via ORAL
  Filled 2021-07-29 (×2): qty 1

## 2021-07-29 MED ORDER — GABAPENTIN 100 MG PO CAPS
100.0000 mg | ORAL_CAPSULE | Freq: Every day | ORAL | Status: DC
  Administered 2021-07-29: 100 mg via ORAL
  Filled 2021-07-29: qty 1

## 2021-07-29 MED ORDER — INSULIN ASPART 100 UNIT/ML IJ SOLN
0.0000 [IU] | Freq: Every day | INTRAMUSCULAR | Status: DC
  Administered 2021-07-29: 2 [IU] via SUBCUTANEOUS

## 2021-07-29 MED ORDER — ATOVAQUONE 750 MG/5ML PO SUSP
1500.0000 mg | Freq: Two times a day (BID) | ORAL | Status: DC
  Administered 2021-07-29 – 2021-07-30 (×2): 1500 mg via ORAL
  Filled 2021-07-29 (×4): qty 10

## 2021-07-29 MED ORDER — HYDRALAZINE HCL 50 MG PO TABS
100.0000 mg | ORAL_TABLET | ORAL | Status: DC
  Administered 2021-07-29 (×2): 100 mg via ORAL
  Filled 2021-07-29 (×2): qty 2

## 2021-07-29 MED ORDER — SODIUM ZIRCONIUM CYCLOSILICATE 10 G PO PACK
10.0000 g | PACK | ORAL | Status: DC
  Administered 2021-07-29: 10 g via ORAL
  Filled 2021-07-29: qty 1

## 2021-07-29 NOTE — Progress Notes (Signed)
[facility] Progress Note  Subjective: seen in ED room, feels "much better" today, getting EEG now  Vitals:   07/29/21 1000 07/29/21 1030 07/29/21 1100 07/29/21 1300  BP: (!) 154/69 (!) 186/66 (!) 184/78 (!) 172/78  Pulse: 65 64 68 70  Resp: 19 12 12 14   Temp:      TempSrc:      SpO2: 97% 100% 98% 97%  Weight:        Exam:   alert, nad   no jvd  Chest cta bilat to bases  Cor reg no RG  Abd soft ntnd no ascites   Ext no LE edema   Alert, NF, ox3   LUA AVF+bruit   Home meds include - eliquis, lipitor, phoslo 1 ac tid, celexa, zetia, neurontin, hydralazine 100 tid, insulin glargine/ pump, imdur, losartan, novolog tid ac, protonix, lokelma TTS (stopped 2 wks ago), vits/ supps/ prns      OP HD: MWF SW   3h 45min  400/500   59kg  2/2 bath  P4  Hep none  - sensipar 60 tiw  - hect 2 ug tiw  - aranesp 200 weekly last 11/9, due 11/16     Assessment/ Plan: Severe hyperkalemia - his tiw Lokelma was stopped 2-3 wks ago, not sure why. We will resume 10gm Lokelma tiw TTS.  In ED pt was rx'd w/ temporizing measures (IV Ca++/ IV ins+ glucose/ IV NaHCO3 + 10mg  albuterol nebs) and he got HD yesterday afternoon. K 4.7 today. Resolved.  Seizures - per neuro, getting EEG Uveitis - taking atovaquone for toxo uveitis. Also +QuantiFERON. Per pmd.  Hx ICM/ sp CABG 2017 - now w/ preserved EF ESRD - on HD MWF. HD Wed upstairs.  CAD / hx atrial fib Vol /HTN - BP's wnl here, no resp issues. CXR neg. UP 5-6kg  by wts, no sure accuracy. Plan UF 3-4 L w/ HD tomorrow.  Anemia ckd - esa due 11/16. Hb low 7.8, follow.  MBD ckd - Ca ok, cont binders, vdra and sensipar          Rob Shabana Armentrout 07/29/2021, 1:30 PM   Recent Labs  Lab 07/28/21 1944 07/28/21 2213 07/29/21 0355  K  --  5.2* 4.6  BUN  --  18 21  CREATININE 4.83* 5.02* 5.46*  CALCIUM  --  8.1* 8.3*  PHOS  --   --  4.7*  HGB 7.0*  --  7.8*   Inpatient medications:  atorvastatin  80 mg Oral Daily   Chlorhexidine  Gluconate Cloth  6 each Topical Q0600   [START ON 07/30/2021] cinacalcet  60 mg Oral Q M,W,F-HD   [START ON 07/30/2021] doxercalciferol  2 mcg Intravenous Q M,W,F-HD   insulin aspart  0-9 Units Subcutaneous Q4H   [START ON 07/30/2021] levETIRAcetam  250 mg Oral Q M,W,F-2000   levETIRAcetam  500 mg Oral Daily   sodium zirconium cyclosilicate  10 g Oral Q W,LS,[id number]   vancomycin variable dose per unstable renal function (pharmacist dosing)   Does not apply See admin instructions    ampicillin (OMNIPEN) IV Stopped (07/29/21 0441)   ceFEPime (MAXIPIME) IV Stopped (07/28/21 2015)   LORazepam

## 2021-07-29 NOTE — ED Notes (Signed)
Pt states he does not make urine.  

## 2021-07-29 NOTE — ED Notes (Signed)
Pt was informed with the use of interpreter that by law he is not allowed to drive a vehicle by doctor. Pt wife was also informed by use of interpreter that pt was not allowed to drive a vehicle.

## 2021-07-29 NOTE — ED Notes (Signed)
ED TO INPATIENT HANDOFF REPORT  ED Nurse Name and Phone #: Baxter Flattery, RN [phone]  S Name/Age/Gender Kenneth [name] 65 y.o. male Room/Bed: 011C/011C  Code Status   Code Status: DNR  Home/SNF/Other Home Patient oriented to: self, place, time, and situation Is this baseline? Yes      Chief Complaint Acute respiratory failure ([place]) [J96.00]  Triage Note BIB [facility] called to diaqlysis for lethargicv. On arrival pale and clammy. Hasn't missed any dialysis. Just not feeling well this weekend.periods of tachypnea. CBG 276, PIV 18ga Rt AC, ASA 324mg , Nitro x 4. Dialysis fistula lt arm.   Allergies No Known Allergies  Level of Care/Admitting Diagnosis ED Disposition     ED Disposition  Admit   Condition  --   Comment  Hospital Area: [facility] [[id number]]  Level of Care: Progressive [102]  Admit to Progressive based on following criteria: NEUROLOGICAL AND NEUROSURGICAL complex patients with significant risk of instability, who do not meet ICU criteria, yet require close observation or frequent assessment (< / = every 2 - 4 hours) with medical / nursing intervention.  Admit to Progressive based on following criteria: MULTISYSTEM THREATS such as stable sepsis, metabolic/electrolyte imbalance with or without encephalopathy that is responding to early treatment.  May admit patient to [facility] or [facility] if equivalent level of care is available:: No  Covid Evaluation: Asymptomatic Screening Protocol (No Symptoms)  Diagnosis: Acute respiratory failure (Kenneth [name]) [518.81.ICD-9-CM]  Admitting Physician: Velna Ochs [[id number]]  Attending Physician: Velna Ochs [[id number]]  Estimated length of stay: past midnight tomorrow  Certification:: I certify this patient will need inpatient services for at least 2 midnights          B Medical/Surgery History Past Medical History:  Diagnosis Date   Acute respiratory failure with hypoxemia ([place]) 05/31/2016   CAD (coronary  artery disease) 10/30/2016   NSTEMI 9/17 with CABG x 3 (LIMA-LAD, SVG-OM, SVG-RCA).   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   type 1 DM   ESRD on hemodialysis ([place])    "MWF; [facility]" (03/30/2018)   Gastroesophageal reflux disease 08/18/2016   HCAP (healthcare-associated pneumonia)    Hyperlipidemia    Hypertension    Hypertensive heart disease 09/12/2017   Ischemic rest pain of lower extremity 02/06/2015   Keratoma 02/27/2015   Metatarsal deformity 02/27/2015   NSTEMI (non-ST elevated myocardial infarction) (HCC)    PAF (paroxysmal atrial fibrillation) (HCC)    Pleural effusion    Pronation deformity of both feet 02/27/2015   Past Surgical History:  Procedure Laterality Date   A/V FISTULAGRAM Left 03/06/2021   Procedure: A/V FISTULAGRAM;  Surgeon: Marty Heck, MD;  Location: [facility];  Service: Cardiovascular;  Laterality: Left;   AV FISTULA PLACEMENT Left 06/22/2016   Procedure: ARTERIOVENOUS (AV) FISTULA CREATION LEFT UPPER ARM;  Surgeon: Rosetta Posner, MD;  Location: [facility];  Service: Vascular;  Laterality: Left;   BIOPSY  12/19/2020   Procedure: BIOPSY;  Surgeon: Ladene Artist, MD;  Location: [facility];  Service: Endoscopy;;   CARDIAC CATHETERIZATION N/A 05/31/2016   Procedure: Left Heart Cath and Coronary Angiography;  Surgeon: Jettie Booze, MD;  Location: [facility];  Service: Cardiovascular;  Laterality: N/A;   CARDIAC CATHETERIZATION N/A 05/31/2016   Procedure: Right Heart Cath;  Surgeon:  Jettie Booze, MD;  Location: [facility];  Service: Cardiovascular;  Laterality: N/A;   CARDIAC CATHETERIZATION N/A 05/31/2016   Procedure: IABP Insertion;  Surgeon: Jettie Booze, MD;  Location: [facility];  Service: Cardiovascular;  Laterality: N/A;   COLONOSCOPY WITH PROPOFOL N/A 12/20/2020    Procedure: COLONOSCOPY WITH PROPOFOL;  Surgeon: Mauri Pole, MD;  Location: [facility];  Service: Endoscopy;  Laterality: N/A;   CORONARY ARTERY BYPASS GRAFT N/A 06/05/2016   Procedure: CORONARY ARTERY BYPASS GRAFTING (CABG) x3 LIMA to LAD -SVG to OM -SVG to RCA;  Surgeon: Ivin Poot, MD;  Location: [facility];  Service: Open Heart Surgery;  Laterality: N/A;   CORONARY STENT INTERVENTION N/A 07/07/2017   Procedure: CORONARY STENT INTERVENTION;  Surgeon: Wellington Hampshire, MD;  Location: [facility];  Service: Cardiovascular;  Laterality: N/A;   CORONARY STENT INTERVENTION N/A 03/30/2018   Procedure: CORONARY STENT INTERVENTION;  Surgeon: Martinique, Peter M, MD;  Location: [facility];  Service: Cardiovascular;  Laterality: N/A;   ESOPHAGOGASTRODUODENOSCOPY (EGD) WITH PROPOFOL N/A 12/19/2020   Procedure: ESOPHAGOGASTRODUODENOSCOPY (EGD) WITH PROPOFOL;  Surgeon: Ladene Artist, MD;  Location[facility];  Service: Endoscopy;  Laterality: N/A;   GIVENS CAPSULE STUDY N/A 12/20/2020   Procedure: GIVENS CAPSULE STUDY;  Surgeon: Mauri Pole, MD;  Location: [facility];  Service: Endoscopy;  Laterality: N/A;   INSERTION OF DIALYSIS CATHETER N/A 06/05/2016   Procedure: INSERTION OF DIALYSIS/trialysis CATHETER;  Surgeon: Ivin Poot, MD;  Location: [facility];  Service: Vascular;  Laterality: N/A;   INSERTION OF DIALYSIS CATHETER Right 06/13/2016   Procedure: INSERTION OF DIALYSIS CATHETER RIGHT INTERNAL JUGULAR;  Surgeon: Conrad Yoder, MD;  Location: [facility];  Service: Vascular;  Laterality: Right;   INSERTION OF DIALYSIS CATHETER Right 09/07/2019   Procedure: Insertion Of Dialysis Catheter;  Surgeon: Rosetta Posner, MD;  Location: [facility];  Service: Vascular;  Laterality: Right;   INTRAOPERATIVE TRANSESOPHAGEAL ECHOCARDIOGRAM N/A 06/05/2016   Procedure: INTRAOPERATIVE TRANSESOPHAGEAL ECHOCARDIOGRAM;  Surgeon: Ivin Poot, MD;  Location: [facility];  Service: Open Heart Surgery;   Laterality: N/A;   LEFT HEART CATH AND CORS/GRAFTS ANGIOGRAPHY N/A 11/03/2016   Procedure: Left Heart Cath and Cors/Grafts Angiography;  Surgeon: Troy Sine, MD;  Location: [facility];  Service: Cardiovascular;  Laterality: N/A;   LEFT HEART CATH AND CORS/GRAFTS ANGIOGRAPHY N/A 07/07/2017   Procedure: LEFT HEART CATH AND CORS/GRAFTS ANGIOGRAPHY;  Surgeon: Larey Dresser, MD;  Location: [facility];  Service: Cardiovascular;  Laterality: N/A;   LEFT HEART CATH AND CORS/GRAFTS ANGIOGRAPHY N/A 03/30/2018   Procedure: LEFT HEART CATH AND CORS/GRAFTS ANGIOGRAPHY;  Surgeon: Martinique, Peter M, MD;  Location: [facility];  Service: Cardiovascular;  Laterality: N/A;   LEFT HEART CATH AND CORS/GRAFTS ANGIOGRAPHY N/A 04/17/2019   Procedure: LEFT HEART CATH AND CORS/GRAFTS ANGIOGRAPHY;  Surgeon: Larey Dresser, MD;  Location: [facility];  Service: Cardiovascular;  Laterality: N/A;   PERIPHERAL VASCULAR INTERVENTION Left 03/06/2021   Procedure: PERIPHERAL VASCULAR INTERVENTION;  Surgeon: Marty Heck, MD;  Location: [facility];  Service: Cardiovascular;  Laterality: Left;   POLYPECTOMY  12/20/2020   Procedure: POLYPECTOMY;  Surgeon: Mauri Pole, MD;  Location: [facility];  Service: Endoscopy;;   REVISION OF ARTERIOVENOUS GORETEX GRAFT Left 99/83/3825   Procedure: PLICATION OF ANEURYSM OF ARTERIOVENOUS FISTULA  LEFT ARM;  Surgeon: Rosetta Posner, MD;  Location: [facility]  OR;  Service: Vascular;  Laterality: Left;   REVISON OF ARTERIOVENOUS FISTULA Left 09/07/2019   Procedure: REVISON OF LEFT UPPER ARM ARTERIOVENOUS FISTULA;  Surgeon: Rosetta Posner, MD;  Location: [facility];  Service: Vascular;  Laterality: Left;   RIGHT/LEFT HEART CATH AND CORONARY ANGIOGRAPHY N/A 01/03/2021   Procedure: RIGHT/LEFT HEART CATH AND CORONARY ANGIOGRAPHY;  Surgeon: Larey Dresser, MD;  Location: [facility];  Service: Cardiovascular;  Laterality: N/A;     A IV  Location/Drains/Wounds Patient Lines/Drains/Airways Status     Active Line/Drains/Airways     Name Placement date Placement time Site Days   Peripheral IV 07/28/21 18 G Right Antecubital 07/28/21  0717  Antecubital  1   Peripheral IV 07/28/21 20 G 1" Posterior;Right Forearm 07/28/21  0948  Forearm  1   Fistula / Graft Left Upper arm Arteriovenous fistula 03/30/18  1900  Upper arm  1217   Hemodialysis Catheter Right Internal jugular Double lumen Permanent (Tunneled) 09/07/19  0802  Internal jugular  691   Incision (Closed) 07/27/19 Arm Left 07/27/19  1045  -- 733   Incision (Closed) 09/07/19 Arm Left 09/07/19  0844  -- 691   Incision (Closed) 09/07/19 Chest Right 09/07/19  0844  -- 691            Intake/Output Last 24 hours  Intake/Output Summary (Last 24 hours) at 07/29/2021 1448 Last data filed at 07/29/2021 0224 Gross per 24 hour  Intake 1063.79 ml  Output 2519 ml  Net -1455.21 ml    Labs/Imaging Results for orders placed or performed during the hospital encounter of 07/28/21 (from the past 48 hour(s))  CBG monitoring, ED     Status: Abnormal   Collection Time: 07/28/21  7:20 AM  Result Value Ref Range   Glucose-Capillary 189 (H) 70 - 99 mg/dL    Comment: Glucose reference range applies only to samples taken after fasting for at least 8 hours.  Comprehensive metabolic panel     Status: Abnormal   Collection Time: 07/28/21  7:24 AM  Result Value Ref Range   Sodium 132 (L) 135 - 145 mmol/L   Potassium >7.5 (HH) 3.5 - 5.1 mmol/L    Comment: CRITICAL RESULT CALLED TO, READ BACK BY AND VERIFIED WITH: D JAY BY SSTEPHENS [phone] [id number] NO VISIBLE HEMOLYSIS    Chloride 92 (L) 98 - 111 mmol/L   CO2 24 22 - 32 mmol/L   Glucose, Bld 213 (H) 70 - 99 mg/dL    Comment: Glucose reference range applies only to samples taken after fasting for at least 8 hours.   BUN 56 (H) 8 - 23 mg/dL   Creatinine, Ser 10.57 (H) 0.61 - 1.24 mg/dL   Calcium 9.3 8.9 - 10.3 mg/dL   Total Protein 6.7  6.5 - 8.1 g/dL   Albumin 3.9 3.5 - 5.0 g/dL   AST 44 (H) 15 - 41 U/L   ALT 27 0 - 44 U/L   Alkaline Phosphatase 77 38 - 126 U/L   Total Bilirubin 0.6 0.3 - 1.2 mg/dL   GFR, Estimated 5 (L) >60 mL/min    Comment: (NOTE) Calculated using the CKD-EPI Creatinine Equation (2021)    Anion gap 16 (H) 5 - 15    Comment: Performed at [facility], [place] [address], [place], [place] [address]  Brain natriuretic peptide     Status: Abnormal   Collection Time: 07/28/21  7:24 AM  Result Value Ref Range   B Natriuretic Peptide 1,925.2 (  H) 0.0 - 100.0 pg/mL    Comment: Performed at [facility], [place] [address] [place], [place] [address]  Troponin I (High Sensitivity)     Status: Abnormal   Collection Time: 07/28/21  7:24 AM  Result Value Ref Range   Troponin I (High Sensitivity) 583 (HH) <18 ng/L    Comment: CRITICAL RESULT CALLED TO, READ BACK BY AND VERIFIED WITH: D JAY RN BY SSTEPHENS [phone] [id number] (NOTE) Elevated high sensitivity troponin I (hsTnI) values and significant  changes across serial measurements may suggest ACS but many other  chronic and acute conditions are known to elevate hsTnI results.  Refer to the Links section for chest pain algorithms and additional  guidance. Performed at [facility], [place] [address], [place], [place] [address]   CBC with Differential     Status: Abnormal   Collection Time: 07/28/21  7:24 AM  Result Value Ref Range   WBC 7.3 4.0 - 10.5 K/uL   RBC 2.56 (L) 4.22 - 5.81 MIL/uL   Hemoglobin 7.8 (L) 13.0 - 17.0 g/dL   HCT 25.5 (L) 39.0 - 52.0 %   MCV 99.6 80.0 - 100.0 fL   MCH 30.5 26.0 - 34.0 pg   MCHC 30.6 30.0 - 36.0 g/dL   RDW 18.0 (H) 11.5 - 15.5 %   Platelets 210 150 - 400 K/uL   nRBC 0.0 0.0 - 0.2 %   Neutrophils Relative % 75 %   Neutro Abs 5.5 1.7 - 7.7 K/uL   Lymphocytes Relative 14 %   Lymphs Abs 1.0 0.7 - 4.0 K/uL   Monocytes Relative 8 %   Monocytes Absolute 0.6 0.1 - 1.0 K/uL   Eosinophils Relative 1 %   Eosinophils  Absolute 0.1 0.0 - 0.5 K/uL   Basophils Relative 1 %   Basophils Absolute 0.1 0.0 - 0.1 K/uL   Immature Granulocytes 1 %   Abs Immature Granulocytes 0.04 0.00 - 0.07 K/uL    Comment: Performed at [facility] [address], [place], [place] [address]  Magnesium     Status: Abnormal   Collection Time: 07/28/21  7:24 AM  Result Value Ref Range   Magnesium 3.1 (H) 1.7 - 2.4 mg/dL    Comment: Performed at [facility] [address] [place], [place] [address]  D-dimer, quantitative     Status: None   Collection Time: 07/28/21  7:24 AM  Result Value Ref Range   D-Dimer, Quant 0.34 0.00 - 0.50 ug/mL-FEU    Comment: (NOTE) At the manufacturer cut-off value of 0.5 g/mL FEU, this assay has a negative predictive value of 95-100%.This assay is intended for use in conjunction with a clinical pretest probability (PTP) assessment model to exclude pulmonary embolism (PE) and deep venous thrombosis (DVT) in outpatients suspected of PE or DVT. Results should be correlated with clinical presentation. Performed at [facility], [place] [address], [place], [place] [address]   Lactic acid, plasma     Status: None   Collection Time: 07/28/21  8:22 AM  Result Value Ref Range   Lactic Acid, Venous 1.7 0.5 - 1.9 mmol/L    Comment: Performed at [facility] [address] [place], [place] [address]  Troponin I (High Sensitivity)     Status: Abnormal   Collection Time: 07/28/21  9:24 AM  Result Value Ref Range   Troponin I (High Sensitivity) 492 (HH) <18 ng/L    Comment: CRITICAL VALUE NOTED.  VALUE IS CONSISTENT WITH PREVIOUSLY REPORTED AND CALLED VALUE. (NOTE) Elevated high sensitivity troponin I (hsTnI) values and significant  changes across serial measurements may suggest ACS but many other  chronic and acute conditions are known to elevate hsTnI results.  Refer to the Links section for chest pain algorithms and additional  guidance. Performed at [facility],  [place] [address], [place], [place] [address]   CBG monitoring, ED     Status: Abnormal   Collection Time: 07/28/21  9:47 AM  Result Value Ref Range   Glucose-Capillary 167 (H) 70 - 99 mg/dL    Comment: Glucose reference range applies only to samples taken after fasting for at least 8 hours.   Comment 1 Notify RN   Lactic acid, plasma     Status: None   Collection Time: 07/28/21  9:56 AM  Result Value Ref Range   Lactic Acid, Venous 1.7 0.5 - 1.9 mmol/L    Comment: Performed at [facility] [address] [place], [place] [address]  Resp Panel by RT-PCR (Flu A&B, Covid) Nasopharyngeal Swab     Status: None   Collection Time: 07/28/21  9:56 AM   Specimen: Nasopharyngeal Swab; Nasopharyngeal(NP) swabs in vial transport medium  Result Value Ref Range   SARS Coronavirus 2 by RT PCR NEGATIVE NEGATIVE    Comment: (NOTE) SARS-CoV-2 target nucleic acids are NOT DETECTED.  The SARS-CoV-2 RNA is generally detectable in upper respiratory specimens during the acute phase of infection. The lowest concentration of SARS-CoV-2 viral copies this assay can detect is 138 copies/mL. A negative result does not preclude SARS-Cov-2 infection and should not be used as the sole basis for treatment or other patient management decisions. A negative result may occur with  improper specimen collection/handling, submission of specimen other than nasopharyngeal swab, presence of viral mutation(s) within the areas targeted by this assay, and inadequate number of viral copies(<138 copies/mL). A negative result must be combined with clinical observations, patient history, and epidemiological information. The expected result is Negative.  Fact Sheet for Patients:  EntrepreneurPulse.com.au  Fact Sheet for Healthcare Providers:  IncredibleEmployment.be  This test is no t yet approved or cleared by the [place] FDA and  has been authorized for detection and/or diagnosis of  SARS-CoV-2 by FDA under an Emergency Use Authorization (EUA). This EUA will remain  in effect (meaning this test can be used) for the duration of the COVID-19 declaration under Section 564(b)(1) of the Act, 21 U.S.C.section 360bbb-3(b)(1), unless the authorization is terminated  or revoked sooner.       Influenza A by PCR NEGATIVE NEGATIVE   Influenza B by PCR NEGATIVE NEGATIVE    Comment: (NOTE) The Xpert Xpress SARS-CoV-2/FLU/RSV plus assay is intended as an aid in the diagnosis of influenza from Nasopharyngeal swab specimens and should not be used as a sole basis for treatment. Nasal washings and aspirates are unacceptable for Xpert Xpress SARS-CoV-2/FLU/RSV testing.  Fact Sheet for Patients: EntrepreneurPulse.com.au  Fact Sheet for Healthcare Providers: IncredibleEmployment.be  This test is not yet approved or cleared by the [place] FDA and has been authorized for detection and/or diagnosis of SARS-CoV-2 by FDA under an Emergency Use Authorization (EUA). This EUA will remain in effect (meaning this test can be used) for the duration of the COVID-19 declaration under Section 564(b)(1) of the Act, 21 U.S.C. section 360bbb-3(b)(1), unless the authorization is terminated or revoked.  Performed at [facility], [place] [address], [place], [place] [address]   .  Hepatitis B Surface Antigen     Status: None   Collection Time: 07/28/21 11:20 AM  Result Value Ref Range   Hepatitis B Surface Ag NON REACTIVE NON REACTIVE    Comment: Performed at [facility], [address] [place], [place] [address]  Hepatitis B surface antibody     Status: Abnormal   Collection Time: 07/28/21 11:20 AM  Result Value Ref Range   Hep B S Ab Reactive (A) NON REACTIVE    Comment: (NOTE) Consistent with immunity, greater than 9.9 mIU/mL.  Performed at [facility], [place] [address], [place], [place] [address]   Hepatitis B surface  antibody,quantitative     Status: None   Collection Time: 07/28/21 11:20 AM  Result Value Ref Range   Hepatitis B-Post >1,000.0 Immunity>9.9 mIU/mL    Comment: (NOTE)  Status of Immunity                     Anti-HBs Level  ------------------                     -------------- Inconsistent with Immunity                   0.0 - 9.9 Consistent with Immunity                          >9.9 Performed At: [facility] [address] [place], [place] [id number] Rush Farmer MD [id number]   Type and screen [place]     Status: None   Collection Time: 07/28/21 11:25 AM  Result Value Ref Range   ABO/RH(D) A POS    Antibody Screen NEG    Sample Expiration 07/31/2021,2359    Unit Number [id number]    Blood Component Type RED CELLS,LR    Unit division 00    Status of Unit ISSUED,FINAL    Transfusion Status OK TO TRANSFUSE    Crossmatch Result      Compatible Performed at [facility], [place] [address], [place], [place] [address]   CBG monitoring, ED     Status: Abnormal   Collection Time: 07/28/21  7:31 PM  Result Value Ref Range   Glucose-Capillary 146 (H) 70 - 99 mg/dL    Comment: Glucose reference range applies only to samples taken after fasting for at least 8 hours.   Comment 1 Notify RN    Comment 2 Document in Chart   CBC     Status: Abnormal   Collection Time: 07/28/21  7:44 PM  Result Value Ref Range   WBC 4.1 4.0 - 10.5 K/uL   RBC 2.29 (L) 4.22 - 5.81 MIL/uL   Hemoglobin 7.0 (L) 13.0 - 17.0 g/dL   HCT 22.9 (L) 39.0 - 52.0 %   MCV 100.0 80.0 - 100.0 fL   MCH 30.6 26.0 - 34.0 pg   MCHC 30.6 30.0 - 36.0 g/dL   RDW 18.5 (H) 11.5 - 15.5 %   Platelets 163 150 - 400 K/uL   nRBC 0.0 0.0 - 0.2 %    Comment: Performed at [facility] [address], [place],  [address]  Creatinine, serum     Status: Abnormal   Collection Time: 07/28/21  7:44 PM  Result Value Ref Range   Creatinine, Ser 4.83 (H) 0.61 - 1.24 mg/dL    Comment:  DELTA CHECK NOTED   GFR, Estimated 13 (L) >  60 mL/min    Comment: (NOTE) Calculated using the CKD-EPI Creatinine Equation (2021) Performed at [facility], [place] [address], [place], [place] [address]   Brain natriuretic peptide     Status: Abnormal   Collection Time: 07/28/21  7:44 PM  Result Value Ref Range   B Natriuretic Peptide 2,160.6 (H) 0.0 - 100.0 pg/mL    Comment: Performed at [facility] [address], [place], [place] [address]  Hemoglobin A1c     Status: None   Collection Time: 07/28/21  7:44 PM  Result Value Ref Range   Hgb A1c MFr Bld 5.1 4.8 - 5.6 %    Comment: (NOTE) Pre diabetes:          5.7%-6.4%  Diabetes:              >6.4%  Glycemic control for   <7.0% adults with diabetes    Mean Plasma Glucose 99.67 mg/dL    Comment: Performed at [facility] [address] [place], [place] [address]  Ammonia     Status: None   Collection Time: 07/28/21  7:44 PM  Result Value Ref Range   Ammonia <10 9 - 35 umol/L    Comment: Performed at [facility], [place] [address], [place], [place] [address]  Prepare RBC (crossmatch)     Status: None   Collection Time: 07/28/21  9:53 PM  Result Value Ref Range   Order Confirmation      ORDER PROCESSED BY BLOOD BANK Performed at [facility], [place] [address], [place], [place] [address]   Basic metabolic panel     Status: Abnormal   Collection Time: 07/28/21 10:13 PM  Result Value Ref Range   Sodium 136 135 - 145 mmol/L   Potassium 5.2 (H) 3.5 - 5.1 mmol/L    Comment: DELTA CHECK NOTED   Chloride 97 (L) 98 - 111 mmol/L   CO2 28 22 - 32 mmol/L   Glucose, Bld 210 (H) 70 - 99 mg/dL    Comment: Glucose reference range applies only to samples taken after fasting for at least 8 hours.   BUN 18 8 - 23 mg/dL   Creatinine, Ser 5.02 (H) 0.61 - 1.24 mg/dL   Calcium 8.1 (L) 8.9 - 10.3 mg/dL   GFR, Estimated 12 (L) >60 mL/min    Comment: (NOTE) Calculated using the CKD-EPI Creatinine Equation (2021)     Anion gap 11 5 - 15    Comment: Performed at [facility] [address] [place], [place] [address]  CBG monitoring, ED     Status: Abnormal   Collection Time: 07/29/21 12:38 AM  Result Value Ref Range   Glucose-Capillary 140 (H) 70 - 99 mg/dL    Comment: Glucose reference range applies only to samples taken after fasting for at least 8 hours.  Renal function panel     Status: Abnormal   Collection Time: 07/29/21  3:55 AM  Result Value Ref Range   Sodium 136 135 - 145 mmol/L   Potassium 4.6 3.5 - 5.1 mmol/L   Chloride 97 (L) 98 - 111 mmol/L   CO2 27 22 - 32 mmol/L   Glucose, Bld 95 70 - 99 mg/dL    Comment: Glucose reference range applies only to samples taken after fasting for at least 8 hours.   BUN 21 8 - 23 mg/dL   Creatinine, Ser 5.46 (H) 0.61 - 1.24 mg/dL   Calcium 8.3 (L)  8.9 - 10.3 mg/dL   Phosphorus 4.7 (H) 2.5 - 4.6 mg/dL   Albumin 3.1 (L) 3.5 - 5.0 g/dL   GFR, Estimated 11 (L) >60 mL/min    Comment: (NOTE) Calculated using the CKD-EPI Creatinine Equation (2021)    Anion gap 12 5 - 15    Comment: Performed at [facility] [address], [place], [place] [address]  CBC     Status: Abnormal   Collection Time: 07/29/21  3:55 AM  Result Value Ref Range   WBC 4.3 4.0 - 10.5 K/uL   RBC 2.57 (L) 4.22 - 5.81 MIL/uL   Hemoglobin 7.8 (L) 13.0 - 17.0 g/dL   HCT 25.2 (L) 39.0 - 52.0 %   MCV 98.1 80.0 - 100.0 fL   MCH 30.4 26.0 - 34.0 pg   MCHC 31.0 30.0 - 36.0 g/dL   RDW 19.2 (H) 11.5 - 15.5 %   Platelets 150 150 - 400 K/uL   nRBC 0.0 0.0 - 0.2 %    Comment: Performed at [facility], [place] [address], [place], [place] [address]  CBG monitoring, ED     Status: None   Collection Time: 07/29/21  3:55 AM  Result Value Ref Range   Glucose-Capillary 90 70 - 99 mg/dL    Comment: Glucose reference range applies only to samples taken after fasting for at least 8 hours.  CBG monitoring, ED     Status: Abnormal   Collection Time: 07/29/21  7:47 AM  Result Value  Ref Range   Glucose-Capillary 130 (H) 70 - 99 mg/dL    Comment: Glucose reference range applies only to samples taken after fasting for at least 8 hours.  CBG monitoring, ED     Status: Abnormal   Collection Time: 07/29/21 11:13 AM  Result Value Ref Range   Glucose-Capillary 103 (H) 70 - 99 mg/dL    Comment: Glucose reference range applies only to samples taken after fasting for at least 8 hours.   CT Head Wo Contrast  Result Date: 07/28/2021 CLINICAL DATA:  Seizure, nontraumatic (Age >= 45y) Mental status change, unknown cause EXAM: CT HEAD WITHOUT CONTRAST TECHNIQUE: Contiguous axial images were obtained from the base of the skull through the vertex without intravenous contrast. COMPARISON:  12/22/2020 FINDINGS: Brain: There is no acute intracranial hemorrhage, mass effect, or edema. Gray-white differentiation is preserved. There is no extra-axial fluid collection. Ventricles and sulci are stable in size and configuration. Patchy hypoattenuation in the supratentorial white matter is nonspecific but probably reflects stable chronic microvascular ischemic changes. 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 Vascular: There is atherosclerotic calcification at the skull base. Skull: Calvarium is unremarkable. Sinuses/Orbits: No acute finding. Other: None. IMPRESSION: No acute intracranial abnormality. Stable chronic findings detailed above. Electronically Signed   By: Macy Mis M.D.   On: 07/28/2021 08:16   MR BRAIN WO CONTRAST  Result Date: 07/28/2021 CLINICAL DATA:  Seizure, abnormal neuro exam EXAM: MRI HEAD WITHOUT CONTRAST TECHNIQUE: Multiplanar, multiecho pulse sequences of the brain and surrounding structures were obtained without intravenous contrast. COMPARISON:  08/27/2019 MRI brain, correlation is also made with CT head 07/28/2021 FINDINGS: Brain: No restricted diffusion to suggest acute infarct. No acute hemorrhage, mass, mass effect, or midline  shift. Scattered T2 hyperintense signal in the periventricular white matter, likely the sequela of chronic small vessel ischemic disease. 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 No hydrocephalus or  extra-axial collection. The hippocampi are symmetric in size and normal in signal. No heterotopia or evidence of cortical dysgenesis. Vascular: Normal flow voids. Skull and upper cervical spine: Normal marrow signal. Sinuses/Orbits: Negative.  Status post bilateral lens replacements. Other: The mastoids are well aerated. IMPRESSION: No acute intracranial process.  No seizure etiology identified. Electronically Signed   By: Merilyn Baba M.D.   On: 07/28/2021 19:38   DG Chest Port 1 View  Result Date: 07/28/2021 CLINICAL DATA:  Fatigue and dialysis.  Chest pain EXAM: PORTABLE CHEST 1 VIEW COMPARISON:  01/01/2021 FINDINGS: Cardiomegaly.  CABG.  Stable aortic and hilar contours. Artifact from EKG leads. There is no edema, consolidation, effusion, or pneumothorax. Left subclavian stenting IMPRESSION: No evidence of acute disease. Chronic cardiomegaly. Electronically Signed   By: Jorje Guild M.D.   On: 07/28/2021 07:51   EEG adult  Result Date: 07/29/2021 Lora Havens, MD     07/29/2021 11:08 AM Patient Name: Kenneth [name] MRN: [id number] Epilepsy Attending: Lora Havens Referring Physician/Provider: Velna Ochs, MD Date:  07/29/2021 Duration: 20.34 mins Patient history:  65 y.o. male who presented to the ED for evaluation of shortness of breath and generalized weakness with witnessed seizure activity in the ED. EEG to evaluate for seizure. Level of alertness: Awake AEDs during EEG study: Technical aspects: This EEG study was done with scalp electrodes positioned according to the 10-20 International system of electrode placement. Electrical activity was acquired at a sampling rate of 500Hz  and reviewed with a high frequency filter of 70Hz  and a low  frequency filter of 1Hz . EEG data were recorded continuously and digitally stored. Description: The posterior dominant rhythm consists of 9 Hz activity of moderate voltage (25-35 uV) seen predominantly in posterior head regions, symmetric and reactive to eye opening and eye closing.  Hyperventilation and photic stimulation were not performed.   Of note, EEG was technically difficult due to significant movement artifact. IMPRESSION: This technically difficult study is within normal limits. No seizures or epileptiform discharges were seen throughout the recording. If suspicion for ictal-interictal activity persists, can consider repeat study. [employer] (From admission, onward)     Start     Ordered   07/29/21 0500  Renal function panel  Daily,   R      07/28/21 1226   07/28/21 1758  MTB RIF NAA Non-Sputum, w/o Culture  Once,   R        07/28/21 1757   07/28/21 1758  Toxoplasma Gondii, PCR  Once,   R        07/28/21 1757   07/28/21 1348  Occult blood card to lab, stool  Once,   R        07/28/21 1347   07/28/21 1216  Culture, blood (routine x 2)  BLOOD CULTURE X 2,   R (with TIMED occurrences)      07/28/21 1226   07/28/21 1109  Hepatitis B DNA, Ultraquantitative, PCR  ONCE - STAT,   STAT        07/28/21 1109   Signed and Held  Hepatitis B surface antigen  (New Admission Hemo Labs (Hepatitis B))  Once,   R        Signed and Held            Vitals/Pain Today's Vitals   07/29/21 1030 07/29/21 1100 07/29/21 1300 07/29/21 1400  BP: (!) 186/66 (!) 184/78 (!) 172/78 (!) 150/69  Pulse: 64 68  70 71  Resp: 12 12 14 18   Temp:      TempSrc:      SpO2: 100% 98% 97% 98%  Weight:        Isolation Precautions Airborne precautions  Medications Medications  LORazepam (ATIVAN) injection 2 mg (has no administration in time range)  Chlorhexidine Gluconate Cloth 2 % PADS 6 each (6 each Topical Not Given 07/29/21 0628)  insulin aspart (novoLOG) injection  0-9 Units (0 Units Subcutaneous Not Given 07/29/21 1114)  doxercalciferol (HECTOROL) injection 2 mcg (has no administration in time range)  cinacalcet (SENSIPAR) tablet 60 mg (has no administration in time range)  levETIRAcetam (KEPPRA) IVPB 1000 mg/100 mL premix (0 mg Intravenous Stopped 07/28/21 2100)    Followed by  levETIRAcetam (KEPPRA) tablet 500 mg (500 mg Oral Given 07/29/21 1049)  levETIRAcetam (KEPPRA) tablet 250 mg (has no administration in time range)  atorvastatin (LIPITOR) tablet 80 mg (80 mg Oral Given 07/29/21 1049)  sodium zirconium cyclosilicate (LOKELMA) packet 10 g (has no administration in time range)  atovaquone (MEPRON) 750 MG/5ML suspension 1,500 mg (has no administration in time range)  furosemide (LASIX) injection 40 mg (40 mg Intravenous Given 07/28/21 1001)  sodium chloride 0.9 % bolus 500 mL (0 mLs Intravenous Stopped 07/28/21 1034)  sodium zirconium cyclosilicate (LOKELMA) packet 10 g (10 g Oral Given 07/28/21 1002)  calcium gluconate 1 g/ 50 mL sodium chloride IVPB (0 mg Intravenous Stopped 07/28/21 1034)  albuterol (PROVENTIL) (2.5 MG/3ML) 0.083% nebulizer solution 10 mg (10 mg Nebulization Given 07/28/21 1010)  insulin aspart (novoLOG) injection 5 Units (5 Units Intravenous Given 07/28/21 0948)    And  dextrose 50 % solution 50 mL (50 mLs Intravenous Given 07/28/21 0949)  sodium bicarbonate injection 50 mEq (50 mEq Intravenous Given 07/28/21 0954)  vancomycin (VANCOREADY) IVPB 1500 mg/300 mL (0 mg Intravenous Stopped 07/28/21 2224)  0.9 %  sodium chloride infusion (Manually program via Guardrails IV Fluids) (0 mLs Intravenous Stopped 07/29/21 0224)    Mobility walks     Focused Assessments Neuro Assessment Handoff:  Swallow screen pass? Yes          Neuro Assessment:   Neuro Checks:      Last Documented NIHSS Modified Score:   Has TPA been given? No If patient is a Neuro Trauma and patient is going to OR before floor call report to [facility]  nurse: [phone] or [phone]   R Recommendations: See Admitting Provider Note  Report given to:   Additional Notes:

## 2021-07-29 NOTE — Plan of Care (Signed)
  Problem: Education: Goal: Knowledge of General Education information will improve Description Including pain rating scale, medication(s)/side effects and non-pharmacologic comfort measures Outcome: Progressing   Problem: Clinical Measurements: Goal: Diagnostic test results will improve Outcome: Progressing Goal: Respiratory complications will improve Outcome: Progressing   

## 2021-07-29 NOTE — Consult Note (Signed)
Subjective: No further episodes concerning for seizure.   Though he does have headaches, he has been getting headaches for four or 5 years.  He also notes that he has been getting headaches that are slightly different over the past 5 to 6 months.  No changes more recent than that.  He describes the headaches as often occurring with an episode of "blanking out."   Exam: Vitals:   07/29/21 1100 07/29/21 1300  BP: (!) 184/78 (!) 172/78  Pulse: 68 70  Resp: 12 14  Temp:    SpO2: 98% 97%   Gen: In bed, NAD Resp: non-labored breathing, no acute distress Abd: soft, nt  Neuro: MS: Awake, alert, oriented CN: Pupils equal round and reactive, visual fields full Motor: 5/5 throughout Sensory: Intact to light touch  Pertinent Labs: Ammonia 10    Impression: 65 yo M With several years of recurrent episodes of behavioral arrest, several months of shaking episodes to which he is amnestic, and a witnessed episode in our ED consistent with seizure. 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 Given the duration of time that the events have been ongoing, I am doubtful that an infectious etiology is responsible for them. I would continue anti-epileptic treatment at this time.     Recommendations: 1) Continue keppra 500mg  daily with additional 250mg  after dialysis.  2) I informed the patient he is not allowed to drive  3) He can follow up with outpatient neurology.   Roland Rack, MD [facility] [phone]  If 7pm- 7am, please page neurology on call as listed in [place].

## 2021-07-29 NOTE — Progress Notes (Signed)
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  ID consulted for possible infectious etiology.   #Positive with IGRA #Nuchal rigidity/hypothermia  #Provisional diagnosis of toxoplasmosis -His overall clinical picture in the hospital seems most consistent with hyperkalemia, although hypothermia was concerning.  He had peaked T waves on EKG, electrolyte derangements, as well as seizure-like in the ED. Patient had been on Lokelma chronically which was stopped 2 to 3 weeks ago. Lokelma restarted during admission. -Pt's mental status has improved. Neurology consulted and suspect he may had partial seizure.  -Do not suspect CNS infection as such d/c antibiotics.   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 Used stratus to interpret conversation. Wife at bedside. Pt reports he feels much better today.  Afebrile overnight.   Review of Systems: Review of Systems  All other systems reviewed and are negative.   Scheduled Meds:  atorvastatin  80 mg Oral Daily   atovaquone  1,500 mg Oral BID WC   Chlorhexidine Gluconate Cloth  6 each Topical Q0600   [START ON 07/30/2021] cinacalcet  60 mg Oral Q M,W,F-HD   [START ON 07/30/2021] doxercalciferol  2 mcg Intravenous Q M,W,F-HD   insulin aspart  0-9 Units Subcutaneous Q4H   [START ON 07/30/2021] levETIRAcetam  250 mg Oral  Q M,W,F-2000   levETIRAcetam  500 mg Oral Daily   sodium zirconium cyclosilicate  10 g Oral Q T,[id number]   Continuous Infusions: PRN Meds:.LORazepam No Known Allergies  OBJECTIVE: Vitals:   07/29/21 1030 07/29/21 1100 07/29/21 1300 07/29/21 1400  BP: (!) 186/66 (!) 184/78 (!) 172/78 (!) 150/69  Pulse: 64 68 70 71  Resp: 12 12 14 18   Temp:      TempSrc:      SpO2: 100% 98% 97% 98%  Weight:       Body mass index is 23.09 kg/m.  Physical Exam Constitutional:      General: He is not in acute distress.    Appearance: He is normal weight. He is not toxic-appearing.  HENT:     Head: Normocephalic and atraumatic.     Right Ear: External ear normal.     Left Ear: External ear normal.     Nose: No congestion or rhinorrhea.     Mouth/Throat:     Mouth: Mucous membranes are moist.     Pharynx: Oropharynx is clear.  Eyes:     Extraocular Movements: Extraocular movements intact.     Conjunctiva/sclera: Conjunctivae normal.     Pupils: Pupils are equal, round, and reactive to light.  Cardiovascular:     Rate and Rhythm: Normal rate and regular rhythm.     Heart sounds: No murmur heard.   No friction rub. No gallop.  Pulmonary:     Effort: Pulmonary effort is normal.     Breath sounds: Normal breath sounds.  Abdominal:     General: Abdomen is flat. Bowel sounds are normal.  Palpations: Abdomen is soft.  Musculoskeletal:        General: No swelling. Normal range of motion.     Cervical back: Normal range of motion and neck supple.  Skin:    General: Skin is warm and dry.  Neurological:     General: No focal deficit present.     Mental Status: He is oriented to person, place, and time.  Psychiatric:        Mood and Affect: Mood normal.      Lab Results Lab Results  Component Value Date   WBC 4.3 07/29/2021   HGB 7.8 (L) 07/29/2021   HCT 25.2 (L) 07/29/2021   MCV 98.1 07/29/2021   PLT 150 07/29/2021    Lab Results  Component Value Date   CREATININE 5.46 (H)  07/29/2021   BUN 21 07/29/2021   NA 136 07/29/2021   K 4.6 07/29/2021   CL 97 (L) 07/29/2021   CO2 27 07/29/2021    Lab Results  Component Value Date   ALT 27 07/28/2021   AST 44 (H) 07/28/2021   ALKPHOS 77 07/28/2021   BILITOT 0.6 07/28/2021        Laurice Record, [place] [facility] 07/29/2021, 2:58 PM

## 2021-07-29 NOTE — Procedures (Signed)
Patient Name: Kenneth [name]  MRN: [id number]  Epilepsy Attending: Lora Havens  Referring Physician/Provider: Velna Ochs, MD Date:  07/29/2021 Duration: 20.34 mins  Patient history:  65 y.o. male who presented to the ED for evaluation of shortness of breath and generalized weakness with witnessed seizure activity in the ED. EEG to evaluate for seizure.  Level of alertness: Awake  AEDs during EEG study:   Technical aspects: This EEG study was done with scalp electrodes positioned according to the 10-20 International system of electrode placement. Electrical activity was acquired at a sampling rate of 500Hz  and reviewed with a high frequency filter of 70Hz  and a low frequency filter of 1Hz . EEG data were recorded continuously and digitally stored.   Description: The posterior dominant rhythm consists of 9 Hz activity of moderate voltage (25-35 uV) seen predominantly in posterior head regions, symmetric and reactive to eye opening and eye closing.  Hyperventilation and photic stimulation were not performed.     Of note, EEG was technically difficult due to significant movement artifact.  IMPRESSION: This technically difficult study is within normal limits. No seizures or epileptiform discharges were seen throughout the recording.  If suspicion for ictal-interictal activity persists, can consider repeat study.  Uma Jerde Barbra Sarks

## 2021-07-29 NOTE — Progress Notes (Signed)
EEG complete - results pending 

## 2021-07-29 NOTE — Progress Notes (Signed)
Hospital day: 1  Subjective:  [name] is a 65 year old male with a history of ESRD on dialysis, type 1 DM, ischemic cardiomyopathy, HTN, and Toxoplasma uveitis who presented with weakness, severe hyperkalemia, and concern for seizures.   Overnight event: No acute events.   Patient seen at bedside during morning rounds. He states that he was diagnosed with type 1 diabetes at age 66. He notes that he has had ongoing diarrhea for about a year and occasionally has small amounts of blood in his stool, most recently last Friday. He denies coughing up blood or blood in his stool last night. He denies pain with defecation or any discomfort at this time.   Objective:  Vital signs in last 24 hours: Vitals:   07/29/21 1100 07/29/21 1300 07/29/21 1400 07/29/21 1500  BP: (!) 184/78 (!) 172/78 (!) 150/69 (!) 162/68  Pulse: 68 70 71 66  Resp: 12 14 18 16   Temp:      TempSrc:      SpO2: 98% 97% 98% 98%  Weight:        Filed Weights   07/28/21 1500  Weight: 64.9 kg     Intake/Output Summary (Last 24 hours) at 07/29/2021 1134 Last data filed at 07/29/2021 0224 Gross per 24 hour  Intake 1063.79 ml  Output 2519 ml  Net -1455.21 ml   Net IO Since Admission: -905.21 mL [07/29/21 1134]  Recent Labs    07/29/21 0355 07/29/21 0747 07/29/21 1113  GLUCAP 90 130* 103*     Pertinent Labs: CBC Latest Ref Rng & Units 07/29/2021 07/28/2021 07/28/2021  WBC 4.0 - 10.5 K/uL 4.3 4.1 7.3  Hemoglobin 13.0 - 17.0 g/dL 7.8(L) 7.0(L) 7.8(L)  Hematocrit 39.0 - 52.0 % 25.2(L) 22.9(L) 25.5(L)  Platelets 150 - 400 K/uL 150 163 210   BMP Latest Ref Rng & Units 07/29/2021 07/28/2021 07/28/2021  Glucose 70 - 99 mg/dL 95 210(H) -  BUN 8 - 23 mg/dL 21 18 -  Creatinine 0.61 - 1.24 mg/dL 5.46(H) 5.02(H) 4.83(H)  Sodium 135 - 145 mmol/L 136 136 -  Potassium 3.5 - 5.1 mmol/L 4.6 5.2(H) -  Chloride 98 - 111 mmol/L 97(L) 97(L) -  CO2 22 - 32 mmol/L 27 28 -  Calcium 8.9 - 10.3 mg/dL 8.3(L) 8.1(L) -    Imaging: MR BRAIN WO CONTRAST  Result Date: 07/28/2021 CLINICAL DATA:  Seizure, abnormal neuro exam EXAM: MRI HEAD WITHOUT CONTRAST TECHNIQUE: Multiplanar, multiecho pulse sequences of the brain and surrounding structures were obtained without intravenous contrast. COMPARISON:  08/27/2019 MRI brain, correlation is also made with CT head 07/28/2021 FINDINGS: Brain: No restricted diffusion to suggest acute infarct. No acute hemorrhage, mass, mass effect, or midline shift. Scattered T2 hyperintense signal in the periventricular white matter, likely the sequela of chronic small vessel ischemic disease. 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 No hydrocephalus or extra-axial collection. The hippocampi are symmetric in size and normal in signal. No heterotopia or evidence of cortical dysgenesis. Vascular: Normal flow voids. Skull and upper cervical spine: Normal marrow signal. Sinuses/Orbits: Negative.  Status post bilateral lens replacements. Other: The mastoids are well aerated. IMPRESSION: No acute intracranial process.  No seizure etiology identified. Electronically Signed   By: Merilyn Baba M.D.   On: 07/28/2021 19:38   EEG adult  Result Date: 07/29/2021 Lora Havens, MD     07/29/2021 11:08 AM Patient Name: Kenneth [name] MRN: [id number] Epilepsy Attending: Cecil Cranker  Hortense Ramal Referring Physician/Provider: Velna Ochs, MD Date:  07/29/2021 Duration: 20.34 mins Patient history:  65 y.o. male who presented to the ED for evaluation of shortness of breath and generalized weakness with witnessed seizure activity in the ED. EEG to evaluate for seizure. Level of alertness: Awake AEDs during EEG study: Technical aspects: This EEG study was done with scalp electrodes positioned according to the 10-20 International system of electrode placement. Electrical activity was acquired at a sampling rate of 500Hz  and reviewed with a high frequency filter of 70Hz  and a  low frequency filter of 1Hz . EEG data were recorded continuously and digitally stored. Description: The posterior dominant rhythm consists of 9 Hz activity of moderate voltage (25-35 uV) seen predominantly in posterior head regions, symmetric and reactive to eye opening and eye closing.  Hyperventilation and photic stimulation were not performed.   Of note, EEG was technically difficult due to significant movement artifact. IMPRESSION: This technically difficult study is within normal limits. No seizures or epileptiform discharges were seen throughout the recording. If suspicion for ictal-interictal activity persists, can consider repeat study. Lora Havens    Physical Exam  General: Pleasant, alert, sitting up in bed  Head: No obvious nuchal rigidity, decreased cervical spine extension, points to pain at right side of posterior neck  CV: RRR, +JVD, no murmurs heard  Pulmonary: Normal WOB on RA, decreased lung sounds throughout  Abdominal:  Soft, nontender, nondistended. Rectal exam with no active bleeding, one external hemorrhoid seen, no anal fissures, slightly decreased rectal tone.  Extremities: No LE edema  Skin: Warm, soft  Neuro: A&Ox3 Psych: Appropriate behavior and affect   Assessment/Plan: Kenneth [name] is a 65 y.o. male with hx of ESRD on dialysis, type 1 DM, ischemic cardiomyopathy, HTN, and Toxoplasma uveitis presenting with weakness, hyperkalemia, and concern for seizures.   Active Problems:   Acute respiratory failure (HCC)  #Acute hypoxic respiratory failure, resolved Appears resolved. Patient no longer requiring supplemental O2, 99% on room air.   #Suspected seizures  #Hypothermia, resolved #Concern for meningitis  Initially concerned for meningitis so patient was started on broad spectrum antibiotics, but appears very well on exam today. Rapid improvement in 1 day makes bacterial meningitis unlikely, consulted with neuro and ID who recommend against LP as electrolyte  derangements can also cause hypothermia and seizures, and his initial K+ was > 7.5. Temperature of 98.8. Neurology suspects partial seizures, EEGs negative so far. MRI w/out contrast also negative.  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  -Outpatient ID follow-up  -Restart Atovaquone   #ESRD on dialysis  #Severe hyperkalemia, resolved  Hyperkalemia has now resolved, most recent K+ 4.6. On MWF dialysis, will undergo HD here tomorrow as well. Lokelma was resumed yesterday.  -Nephrology following, appreciate recs  -Continue Lokelma 10mg  daily   #Acute on chronic anemia  Transfused 1 units of pRBC yesterday when Hgb 7.0, now back up to 7.8. No signs of active bleeding active on rectal exam or reported by patient since last Friday.  -Outpatient GI follow-up -Continue to hold eliquis  -Daily CBC, transfuse if Hgb <7  #HLD  #HTN  HF team consulted, appreciate recs. Losartan stopped due to hyperkalemia.  -Restart Imdur 60mg  po NON HD days   -Continue Atorvastatin 80mg  po qhs -Restart  Zetia 10mg  po daily  -Restart Hydralazine 100mg  TID, hold first 2 doses on HD days -Stop Losartan   Diet: Renally modified  IVF: None  VTE: Hold until stable Hgb  CODE: DNR  Prior to Admission Living Arrangement: Home with wife Anticipated Discharge Location: Home with wife  Barriers to Discharge: Medical stability  Dispo: Anticipated discharge in 1-2 days   Signed: Stefani Dama, Medical Student 07/29/2021, 11:34 AM  Internal Medicine Teaching Service [phone] Please contact the on call pager after 5 pm and on weekends at [phone].

## 2021-07-29 NOTE — Progress Notes (Addendum)
Patient ID: Kenneth [name], male   DOB: [birth year], 65 y.o.   MRN: [id number]  Chart reviewed.  HS-TnI mildly elevated without significant trend, lower than at prior admission when he was cathed with no intervenable disease.  Suspect demand ischemia with some volume overload in the setting of ESRD.  Recent echo with normal EF.   Agree with stopped losartan with hyperkalemia, will likely need to go back on Lokelma (per nephrology).    Would restart atorvastatin.   He has been on Eliquis for PAF, unless actively bleeding would try to eventually get him back on Eliquis.   I think he is stable from cardiac standpoint, will follow at a distance (call with questions).   Loralie Champagne 07/29/2021

## 2021-07-29 NOTE — ED Notes (Signed)
Message sent to pharmacy for ampicillin

## 2021-07-30 ENCOUNTER — Other Ambulatory Visit: Payer: Self-pay

## 2021-07-30 ENCOUNTER — Encounter (HOSPITAL_COMMUNITY): Payer: Self-pay | Admitting: Internal Medicine

## 2021-07-30 DIAGNOSIS — I480 Paroxysmal atrial fibrillation: Secondary | ICD-10-CM

## 2021-07-30 DIAGNOSIS — Z227 Latent tuberculosis: Secondary | ICD-10-CM

## 2021-07-30 DIAGNOSIS — I255 Ischemic cardiomyopathy: Secondary | ICD-10-CM

## 2021-07-30 LAB — RENAL FUNCTION PANEL
Albumin: 3 g/dL — ABNORMAL LOW (ref 3.5–5.0)
Anion gap: 11 (ref 5–15)
BUN: 34 mg/dL — ABNORMAL HIGH (ref 8–23)
CO2: 28 mmol/L (ref 22–32)
Calcium: 8.3 mg/dL — ABNORMAL LOW (ref 8.9–10.3)
Chloride: 97 mmol/L — ABNORMAL LOW (ref 98–111)
Creatinine, Ser: 7.61 mg/dL — ABNORMAL HIGH (ref 0.61–1.24)
GFR, Estimated: 7 mL/min — ABNORMAL LOW (ref >60)
Glucose, Bld: 159 mg/dL — ABNORMAL HIGH (ref 70–99)
Phosphorus: 4.9 mg/dL — ABNORMAL HIGH (ref 2.5–4.6)
Potassium: 4.7 mmol/L (ref 3.5–5.1)
Sodium: 136 mmol/L (ref 135–145)

## 2021-07-30 LAB — CBC
HCT: 27.1 % — ABNORMAL LOW (ref 39.0–52.0)
Hemoglobin: 8.4 g/dL — ABNORMAL LOW (ref 13.0–17.0)
MCH: 29.8 pg (ref 26.0–34.0)
MCHC: 31 g/dL (ref 30.0–36.0)
MCV: 96.1 fL (ref 80.0–100.0)
Platelets: 173 10*3/uL (ref 150–400)
RBC: 2.82 MIL/uL — ABNORMAL LOW (ref 4.22–5.81)
RDW: 18.6 % — ABNORMAL HIGH (ref 11.5–15.5)
WBC: 5 10*3/uL (ref 4.0–10.5)
nRBC: 0 % (ref 0.0–0.2)

## 2021-07-30 LAB — GLUCOSE, CAPILLARY
Glucose-Capillary: 171 mg/dL — ABNORMAL HIGH (ref 70–99)
Glucose-Capillary: 173 mg/dL — ABNORMAL HIGH (ref 70–99)
Glucose-Capillary: 190 mg/dL — ABNORMAL HIGH (ref 70–99)

## 2021-07-30 LAB — HEMOGLOBIN A1C
Hgb A1c MFr Bld: 5.3 % (ref 4.8–5.6)
Mean Plasma Glucose: 105 mg/dL

## 2021-07-30 MED ORDER — LEVETIRACETAM 500 MG PO TABS: 500.0000 mg | ORAL_TABLET | Freq: Every day | ORAL | 0 refills | Status: DC

## 2021-07-30 MED ORDER — LEVETIRACETAM 250 MG PO TABS: 250.0000 mg | ORAL_TABLET | ORAL | 0 refills | Status: DC

## 2021-07-30 MED ORDER — SODIUM ZIRCONIUM CYCLOSILICATE 10 G PO PACK: 10.0000 g | PACK | ORAL | 0 refills | Status: AC

## 2021-07-30 NOTE — TOC Initial Note (Signed)
Transition of Care [facility]) - Initial/Assessment Note    Patient Details  Name: Kenneth [name] MRN: [id number] Date of Birth: Aug 17, 1956  Transition of Care [facility]) CM/SW Contact:    Erenest Rasher, RN Phone Number: [phone] 07/30/2021, 10:11 AM  Clinical Narrative:                 HF TOC CM spoke to pt's wife and offered choice for [facility]. Wife agreeable to [place] for [facility]. Contacted [facility] with new referral.   Expected Discharge Plan: [place] Barriers to Discharge: No Barriers Identified   Patient Goals and CMS Choice Patient states their goals for this hospitalization and ongoing recovery are:: wants to remain independent CMS Medicare.gov Compare Post Acute Care list provided to:: Patient Represenative (must comment) Choice offered to / list presented to : Spouse  Expected Discharge Plan and Services Expected Discharge Plan: [place] In-house Referral: Clinical Social Work Discharge Planning Services: CM Consult Post Acute Care Choice: [place] arrangements for the past 2 months: Single Family Home Expected Discharge Date: 07/30/21                         HH Arranged: PT, OT HH Agency: [place] ([facility]) Date HH Agency Contacted: 07/30/21 Time HH Agency Contacted: 46 Representative spoke with at [place]: Wylene Men  Prior Living Arrangements/Services Living arrangements for the past 2 months: [place] with:: Spouse Patient language and need for interpreter reviewed:: Yes Do you feel safe going back to the place where you live?: Yes      Need for Family Participation in Patient Care: No (Comment) Care giver support system in place?: No (comment)   Criminal Activity/Legal Involvement Pertinent to Current Situation/Hospitalization: No - Comment as needed  Activities of Daily Living Home Assistive Devices/Equipment: None ADL Screening (condition at time of  admission) Patient's cognitive ability adequate to safely complete daily activities?: Yes Is the patient deaf or have difficulty hearing?: No Does the patient have difficulty seeing, even when wearing glasses/contacts?: No Does the patient have difficulty concentrating, remembering, or making decisions?: No Patient able to express need for assistance with ADLs?: Yes Does the patient have difficulty dressing or bathing?: No Independently performs ADLs?: Yes (appropriate for developmental age) Does the patient have difficulty walking or climbing stairs?: No Weakness of Legs: None Weakness of Arms/Hands: None  Permission Sought/Granted Permission sought to share information with : Case Manager, PCP, Family Supports Permission granted to share information with : Yes, Verbal Permission Granted  Share Information with NAME: Kenneth [name]  Permission granted to share info w AGENCY: [place] granted to share info w Relationship: wife  Permission granted to share info w Contact Information: [phone]  Emotional Assessment           Psych Involvement: No (comment)  Admission diagnosis:  Acute respiratory failure ([place]) [J96.00] Hyperkalemia [E87.5] Delirium [R41.0] Wide QRS ventricular tachycardia [I47.20] Patient Active Problem List   Diagnosis Date Noted   Partial symptomatic epilepsy with complex partial seizures, not intractable, without status epilepticus ([place])    Acute respiratory failure ([place]) 07/28/2021   Acute hypoxemic respiratory failure ([place]) 01/01/2021   Prolonged QT interval 01/01/2021   Hemoptysis 01/01/2021   Occult blood in stools    Symptomatic bradycardia 12/15/2020   Postural dizziness with presyncope 12/15/2020   Blood in stool 18/56/3149   Acute metabolic encephalopathy 70/26/3785  Altered mental status 08/27/2019   Unresponsive episode 08/27/2019   AF (paroxysmal atrial fibrillation) ([place]) 08/27/2019   Status post coronary artery stent  placement    NSTEMI (non-ST elevated myocardial infarction) ([place]) 03/30/2018   Hypertensive heart disease 09/12/2017   Hyperlipidemia 09/12/2017   Hyperkalemia 07/20/2017   Chronic diastolic CHF (congestive heart failure) ([place]) 04/14/2017   Hypertensive emergency 04/14/2017   Anemia, chronic renal failure 04/11/2017   Fluid overload 04/11/2017   Diabetic foot ulcer ([place]) 04/11/2017   Cellulitis in diabetic foot ([place]) 01/18/2017   Chest pain 12/16/2016   Hypertensive urgency 12/16/2016   Pressure injury of skin 11/03/2016   Diabetes mellitus with complication ([place]) 70/48/8891   ESRD (end stage renal disease) ([place]) 10/30/2016   CAD (coronary artery disease) 10/30/2016   Gastroesophageal reflux disease 08/18/2016   Non-ST elevation (NSTEMI) myocardial infarction [facility])    Pronation deformity of both feet 02/27/2015   Metatarsal deformity 02/27/2015   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

## 2021-07-30 NOTE — Discharge Summary (Addendum)
Name: Kenneth [name] MRN: [id number] DOB: 01-Aug-1956 65 y.o. PCP: [name], MD  Date of Admission: 07/28/2021  7:16 AM Date of Discharge: No discharge date for patient encounter. Attending Physician: Velna Ochs, MD  Discharge Diagnosis: 1. Hyperkalemic emergency 2. Seizure disorders 3. Acute on chronic blood loss anemia 4. ESRD 5. Toxoplasma uveitis  6. HTN  Discharge Medications: Allergies as of 07/30/2021   No Known Allergies      Medication List     STOP taking these medications    losartan 25 MG tablet Commonly known as: COZAAR       TAKE these medications    acetaminophen 500 MG tablet Commonly known as: TYLENOL Take 1,000 mg by mouth every 6 (six) hours as needed for mild pain or headache.   apixaban 5 MG Tabs tablet Commonly known as: ELIQUIS Take 1 tablet (5 mg total) by mouth 2 (two) times daily.   atorvastatin 80 MG tablet Commonly known as: LIPITOR Take 1 tablet by mouth once daily   atovaquone 750 MG/5ML suspension Commonly known as: MEPRON Take 1,500 mg by mouth 2 (two) times daily with a meal.   calcium acetate 667 MG capsule Commonly known as: PHOSLO Take 667 mg by mouth 3 (three) times daily with meals.   citalopram 20 MG tablet Commonly known as: CELEXA Take 20 mg by mouth daily with lunch.   Contour Next Test test strip Generic drug: glucose blood Test BS 5 times a day   ezetimibe 10 MG tablet Commonly known as: ZETIA Take 10 mg by mouth daily.   gabapentin 100 MG capsule Commonly known as: NEURONTIN Take 1 capsule (100 mg total) by mouth at bedtime.   hydrALAZINE 100 MG tablet Commonly known as: APRESOLINE Take 1 tablet (100 mg total) by mouth 3 (three) times daily. DO NOT TAKE AM AND NOON DOSES ON HD DAYS What changed:  when to take this additional instructions   insulin pump Soln Inject 1 each into the skin continuous. NOVOLOG - 20 units   isosorbide mononitrate 60 MG 24 hr tablet Commonly known as:  IMDUR Take 1 tablet (60 mg total) by mouth daily. DO NOT TAKE ON HD DAYS What changed: additional instructions   levETIRAcetam 250 MG tablet Commonly known as: KEPPRA Take 1 tablet (250 mg total) by mouth every Monday, Wednesday, and Friday at 8 PM.   levETIRAcetam 500 MG tablet Commonly known as: KEPPRA Take 1 tablet (500 mg total) by mouth daily. Start taking on: July 31, 2021   MIRCERA IJ Mircera   NovoLOG 100 UNIT/ML injection Generic drug: insulin aspart Inject 15-20 Units into the skin 3 (three) times daily with meals. 15-20 units via pump   pantoprazole 40 MG tablet Commonly known as: PROTONIX Take 40 mg by mouth daily.   sodium zirconium cyclosilicate 10 g Pack packet Commonly known as: LOKELMA Take 10 g by mouth every Tuesday, Thursday, and Saturday at 6 PM. Start taking on: July 31, 2021   vitamin B-12 500 MCG tablet Commonly known as: CYANOCOBALAMIN Take 1 tablet (500 mcg total) by mouth daily.   vitamin C 1000 MG tablet Take 1,000 mg by mouth daily.        Disposition and follow-up:   Kenneth [name] was discharged from [facility] in Stable condition.  At the hospital follow up visit please address:  Hyperkalemic emergency Patient presented to the hospital with potassium value greater than 7.5.  This is likely due to discontinuation  of his [place].  Patient received temporizing measure and underwent urgent hemodialysis. Lokelma was resumed. -Please ensure adherence to [facility]. He will take 10 mg Lokelma TTS. -Losartan was held due to hyperkalemia  -Follow up with nephrology and outpatient HD -Repeat BMP   Seizure disorder and headache Per neurology, based on patient history, there is concern for recurrent episodes of partial seizures.  EEG here did not capture an episode of seizure. Keppra was started, 500 mg daily with 250 mg with dialysis. -Ensure adherence to the medication -Patient is advised not to drive given his seizure  disorder.  Please go over this again with patient. -Follow-up with neurology outpatient.  Please make an appointment for patient.  Acute on chronic anemia Patient with history of GI bleeding.  He underwent colonoscopy, EGD and capsule study which did not reveal any source of bleeding.  Patient received a unit of red blood cells here for hemoglobin of 7.  His hemoglobin stable on the day of discharge.  No signs of active bleeding seen on physical exam. -Follow-up with his gastroenterologist -Repeat CBC   Uveitis Latent TB  -Follow up with ID doctor. -Continue Atovaquone -Discuss treatment for latent TB  Ischemic cardiomyopathy Atrial fibrillation -Follow up with Cardiology -Resume Eliquis   2.  Labs / imaging needed at time of follow-up: CBC, BMP  3.  Pending labs/ test needing follow-up: NA  Follow-up Appointments:  Follow-up [place], [facility] Follow up.   Why: [place] will call to arrange appointment Contact information: [place] [id number] [place] Hospital Course by problem list: Patient Summary Kenneth [name] is a 65 y.o. [place] speaking male with a history of DM, ESRD on dialysis, CAD s/p CABG, chronic HFpEF, HTN, and depression who presented on 11/14 with hyperkalemic emergency, hypothermia, and seizure like activity. Hospital course is outlined below.   ED Course Patient arrived via EMS after he appeared lethargic during dialysis. During transport, patient became unresponsive with gurgling respirations concerning for seizure like-activity. Upon arrival, temp was 95.7 and labs notable for K+ >7.5, Mg 3.1, Hgb 7.8, BUN 56, BNP elevated to 1,925, high sensitivity troponin 538 > 492. Lactic acid wnl at 1.7, normal WBC of 7.3. Head CT and CXR negative for acute abnormalities. Given temporizing measures for hyperkalemia including IV Ca++, IV insulin and glucose, IV  bicarb, and albuterol. Nephrology was consulted and arranged emergent HD. Cardiology was consulted given patient's elevated troponin and suspected demand ischemia.   #ESRD requiring HD  #Hyperkalemic emergency  Patient's K+ decreased from >7.5 to 5.2 after emergent HD, and normalized to 4.6 on 11/15. He was restarted on Lokelma 10g. He underwent an additional HD session on Tuesday, 11/14, and was restarted back on his usual MWF schedule on 11/16. Cardiology recommended that he discontinue Losartan due to hyperkalemia.   #Seizure disorder  Neurology was consulted and determined that patient's presentation was consistent with electrolyte derangment as well as a seizure disorder, since his wife has reported episodes for the last 6 months. MRI w/out contrast of head was negative. EEGs did not show seizure activity. Initially there was concern for bacterial meningitis but his rapid improval after 1 session of HD and lack of fever made this very unlikely. He was started on Keppra 500mg  po daily with additional 250mg  after dialysis, which was continued on discharge.   #  Acute on chronic anemia  #Anemia from chronic kidney disease  Hgb 7.8 initially, then decreased to 7. Patient received 1 unit of pRBCs and Hgb increased back to 7.8. Patient reported diarrhea with some blood for several months, as well as hematochezia. He underwent EGD, colonoscopy, and small bowel video capsule study during hospitalization in April 2022 which identified blood but no source of bleeding. His CKD is also likely contributing to his low baseline Hgb. Rectal exam was performed on 11/15 and one external hemorrhoid was visualized, no active bleeding. Patient reported the last episode of blood in his stool was last Friday, 11/12. He is due to receive ESA on 11/16. At time of discharge, patient's Hgb was 8.4 and he had no signs of active bleeding.   #Ischemic cardiomyopathy   #Paroxysmal afib  Patient's Eliquis was held due to low Hgb  and concern for GI bleed. Repeat EKG showed sinus rhythm and at time of discharge, patient was restarted on Eliquis.   #Type 1 DM  Patient was placed on very sensitive SSI at mealtimes and bedtime with routine CBG monitoring q4h. He will resume usage of his insulin pump when he returns home.   #HTN Patient's BP was moderately elevated on admission. He was restarted on his home medication of Imdur 60mg  po daily (non HD days) and Hydralazine 100mg  TID (hold first 2 doses on HD days) on 11/15.   Follow-Up Patient has follow-up with ID on 12/8, cardiology on 12/6, and endocrinology on 1/26. PT and OT recommended home health PT/OT, which was arranged. Patient was instructed to make follow-up appointment with PCP in 1-2 weeks and schedule outpatient neurology appointment for seizures.    Discharge Exam:   BP (!) 136/58   Pulse 61   Temp 98 F (36.7 C)   Resp 13   Ht 5\' 9"  (1.753 m)   Wt 62.2 kg   SpO2 97%   BMI 20.25 kg/m   Discharge exam:  General: Pleasant, well-appearing male laying in bed. No acute distress. Head: Normocephalic. Atraumatic. CV: RRR. No murmurs, rubs, or gallops. No LE edema Pulmonary: Slightly diminished breath sounds. Normal work of breathing on room air.  Abdominal: Soft, nontender, nondistended. Normal bowel sounds. Extremities: Palpable radial and DP pulses. Normal ROM. Skin: Warm and dry. No obvious rash or lesions. Neuro: A&Ox3. No focal deficit. Psych: Normal mood and affect   Pertinent Labs, Studies, and Procedures:  CT Head Wo Contrast  Result Date: 07/28/2021 CLINICAL DATA:  Seizure, nontraumatic (Age >= 21y) Mental status change, unknown cause EXAM: CT HEAD WITHOUT CONTRAST TECHNIQUE: Contiguous axial images were obtained from the base of the skull through the vertex without intravenous contrast. COMPARISON:  12/22/2020 FINDINGS: Brain: There is no acute intracranial hemorrhage, mass effect, or edema. Gray-white differentiation is preserved. There is  no extra-axial fluid collection. Ventricles and sulci are stable in size and configuration. Patchy hypoattenuation in the supratentorial white matter is nonspecific but probably reflects stable chronic microvascular ischemic changes. 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 Vascular: There is atherosclerotic calcification at the skull base. Skull: Calvarium is unremarkable. Sinuses/Orbits: No acute finding. Other: None. IMPRESSION: No acute intracranial abnormality. Stable chronic findings detailed above. Electronically Signed   By: Macy Mis M.D.   On: 07/28/2021 08:16   MR BRAIN WO CONTRAST  Result Date: 07/28/2021 CLINICAL DATA:  Seizure, abnormal neuro exam EXAM: MRI HEAD WITHOUT CONTRAST TECHNIQUE: Multiplanar, multiecho pulse sequences of the brain and surrounding structures  were obtained without intravenous contrast. COMPARISON:  08/27/2019 MRI brain, correlation is also made with CT head 07/28/2021 FINDINGS: Brain: No restricted diffusion to suggest acute infarct. No acute hemorrhage, mass, mass effect, or midline shift. Scattered T2 hyperintense signal in the periventricular white matter, likely the sequela of chronic small vessel ischemic disease. 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 No hydrocephalus or extra-axial collection. The hippocampi are symmetric in size and normal in signal. No heterotopia or evidence of cortical dysgenesis. Vascular: Normal flow voids. Skull and upper cervical spine: Normal marrow signal. Sinuses/Orbits: Negative.  Status post bilateral lens replacements. Other: The mastoids are well aerated. IMPRESSION: No acute intracranial process.  No seizure etiology identified. Electronically Signed   By: Merilyn Baba M.D.   On: 07/28/2021 19:38   DG Chest Port 1 View  Result Date: 07/28/2021 CLINICAL DATA:  Fatigue and dialysis.  Chest pain EXAM: PORTABLE CHEST 1 VIEW COMPARISON:   01/01/2021 FINDINGS: Cardiomegaly.  CABG.  Stable aortic and hilar contours. Artifact from EKG leads. There is no edema, consolidation, effusion, or pneumothorax. Left subclavian stenting IMPRESSION: No evidence of acute disease. Chronic cardiomegaly. Electronically Signed   By: Jorje Guild M.D.   On: 07/28/2021 07:51   EEG adult  Result Date: 07/29/2021 Lora Havens, MD     07/29/2021 11:08 AM Patient Name: Kenneth [name] MRN: [id number] Epilepsy Attending: Lora Havens Referring Physician/Provider: Velna Ochs, MD Date:  07/29/2021 Duration: 20.34 mins Patient history:  65 y.o. male who presented to the ED for evaluation of shortness of breath and generalized weakness with witnessed seizure activity in the ED. EEG to evaluate for seizure. Level of alertness: Awake AEDs during EEG study: Technical aspects: This EEG study was done with scalp electrodes positioned according to the 10-20 International system of electrode placement. Electrical activity was acquired at a sampling rate of 500Hz  and reviewed with a high frequency filter of 70Hz  and a low frequency filter of 1Hz . EEG data were recorded continuously and digitally stored. Description: The posterior dominant rhythm consists of 9 Hz activity of moderate voltage (25-35 uV) seen predominantly in posterior head regions, symmetric and reactive to eye opening and eye closing.  Hyperventilation and photic stimulation were not performed.   Of note, EEG was technically difficult due to significant movement artifact. IMPRESSION: This technically difficult study is within normal limits. No seizures or epileptiform discharges were seen throughout the recording. If suspicion for ictal-interictal activity persists, can consider repeat study. Priyanka O Yadav     CBC Latest Ref Rng & Units 07/30/2021 07/29/2021 07/28/2021  WBC 4.0 - 10.5 K/uL 5.0 4.3 4.1  Hemoglobin 13.0 - 17.0 g/dL 8.4(L) 7.8(L) 7.0(L)  Hematocrit 39.0 - 52.0 % 27.1(L) 25.2(L)  22.9(L)  Platelets 150 - 400 K/uL 173 150 163   CMP Latest Ref Rng & Units 07/30/2021 07/29/2021 07/28/2021  Glucose 70 - 99 mg/dL 159(H) 95 210(H)  BUN 8 - 23 mg/dL 34(H) 21 18  Creatinine 0.61 - 1.24 mg/dL 7.61(H) 5.46(H) 5.02(H)  Sodium 135 - 145 mmol/L 136 136 136  Potassium 3.5 - 5.1 mmol/L 4.7 4.6 5.2(H)  Chloride 98 - 111 mmol/L 97(L) 97(L) 97(L)  CO2 22 - 32 mmol/L 28 27 28   Calcium 8.9 - 10.3 mg/dL 8.3(L) 8.3(L) 8.1(L)  Total Protein 6.5 - 8.1 g/dL - - -  Total Bilirubin 0.3 - 1.2 mg/dL - - -  Alkaline Phos 38 - 126 U/L - - -  AST  15 - 41 U/L - - -  ALT 0 - 44 U/L - - -     Discharge Instructions: Discharge Instructions     Call MD for:  persistant nausea and vomiting   Complete by: As directed    Call MD for:  severe uncontrolled pain   Complete by: As directed    Call MD for:  temperature >100.4   Complete by: As directed    Diet - low sodium heart healthy   Complete by: As directed    Discharge instructions   Complete by: As directed    Kenneth [name],  It was a pleasure taking care of you during this admission. You were hospitalized for very high potassium value that was life-threatening. You were treated with medications and emergent dialysis. We will add Lokelma to your medication list. This medication will help keeping your potassium low. Please take 1 packet on Tuesday, Thursday and Saturday.   You were also evaluated by a neurologist and we were concerned that you had a seizure. We added Keppra to help prevent future seizures episode. Please take 500 mg daily with additional 250 mg on Monday, Wednesday and Friday (dialysis day).   Per [facility] statutes, patients with seizures are not allowed to drive until  they have been seizure-free for six months. Use caution when using heavy equipment or power tools. Avoid working on ladders or at heights. Take showers instead of baths. Ensure the water temperature is not too high on the home water heater. Do not go  swimming alone. When caring for infants or small children, sit down when holding, feeding, or changing them to minimize risk of injury to the child in the event you have a seizure.  Also, Maintain good sleep hygiene. Avoid alcohol.  Please follow up with you infectious doctor for your uveitis and latent tuberculosis.  Please follow up with you kidney doctor and cardiologist and primary doctor as well.   Take care,   Dr. Alfonse Spruce   Increase activity slowly   Complete by: As directed    No wound care   Complete by: As directed        Signed: Dorethea Clan, DO 07/30/2021, 1:23 PM   Pager: [phone]

## 2021-07-30 NOTE — Evaluation (Signed)
Physical Therapy Evaluation Patient Details Name: Kenneth [name] MRN: [id number] DOB: 1956/08/11 Today's Date: 07/30/2021  History of Present Illness  65 year old male who was transferred 07/28/21  to [facility] emergency department  from his dialysis center for shortness of breath and weakness and was found to have severe hyperkalemia to 7.5 with EKG changes and hypothermia. +seizure in ED; head CT negative;   PMH- end-stage renal disease on dialysis, ischemic cardiomyopathy, Toxoplasma uveitis, latent TB, and hypertension  Clinical Impression   Pt admitted secondary to problem above with deficits below. PTA patient was ambulating with cane and required wife's assistance on the steps into home and up to second floor.  Pt currently requires minguard to min assist overall for transfers and ambulation with RW. Required frequent rest breaks when walking 30 ft with reports of general fatigue and dizziness. BP 159/79 after walking. Patient normally resides on second floor of home, however states he can stay on the downstairs couch and has 1/2 bathroom downstairs.  Anticipate patient will benefit from PT to address problems listed below. Will continue to follow acutely to maximize functional mobility independence and safety.          Recommendations for follow up therapy are one component of a multi-disciplinary discharge planning process, led by the attending physician.  Recommendations may be updated based on patient status, additional functional criteria and insurance authorization.  Follow Up Recommendations Home health PT    Assistance Recommended at Discharge Frequent or constant Supervision/Assistance  Functional Status Assessment Patient has had a recent decline in their functional status and demonstrates the ability to make significant improvements in function in a reasonable and predictable amount of time.  Equipment Recommendations  None recommended by PT    Recommendations for Other  Services       Precautions / Restrictions Precautions Precautions: Fall      Mobility  Bed Mobility Overal bed mobility: Needs Assistance Bed Mobility: Supine to Sit;Sit to Supine     Supine to sit: Supervision Sit to supine: Supervision   General bed mobility comments: supervision for safety due to dizziness    Transfers Overall transfer level: Needs assistance Equipment used: Rolling walker (2 wheels) Transfers: Sit to/from Stand Sit to Stand: Min guard;+2 safety/equipment           General transfer comment: vc for fully upright (tends to look down, hang head down)    Ambulation/Gait Ambulation/Gait assistance: Min assist;+2 safety/equipment Gait Distance (Feet): 30 Feet Assistive device: Rolling walker (2 wheels) Gait Pattern/deviations: Step-to pattern;Decreased stride length;Trunk flexed Gait velocity: decr Gait velocity interpretation: <1.31 ft/sec, indicative of household ambulator   General Gait Details: frequently stops to rest (every 3-5 steps); requires cues to continue to progress, especially 2nd half of walk (back to bed)  Stairs            Wheelchair Mobility    Modified Rankin (Stroke Patients Only)       Balance Overall balance assessment: Needs assistance Sitting-balance support: No upper extremity supported;Feet supported Sitting balance-Leahy Scale: Fair Sitting balance - Comments: with overhead reaching with posterior imbalance   Standing balance support: Bilateral upper extremity supported;Reliant on assistive device for balance Standing balance-Leahy Scale: Poor                               Pertinent Vitals/Pain Pain Assessment: Faces Faces Pain Scale: Hurts a little bit Pain Location: low back Pain Descriptors / Indicators: Aching  Pain Intervention(s): Limited activity within patient's tolerance;Monitored during session    [place] expects to be discharged to:: Private residence Living  Arrangements: Spouse/significant other Available Help at Discharge: Family;Available 24 hours/day Type of Home: House Home Access: Stairs to enter   Entrance Stairs-Number of Steps: 5 Alternate Level Stairs-Number of Steps: 15 Home Layout: Two level;Bed/bath upstairs Home Equipment: Cane - single Associate Professor (2 wheels);Shower seat;Grab bars - tub/shower      Prior Function Prior Level of Function : Needs assist       Physical Assist : Mobility (physical);ADLs (physical) Mobility (physical): Stairs ADLs (physical): Bathing;Dressing Mobility Comments: uses cane; wife assists on stairs ADLs Comments: wife assists with bathing and dressing     Hand Dominance   Dominant Hand: Right    Extremity/Trunk Assessment   Upper Extremity Assessment Upper Extremity Assessment: Defer to OT evaluation    Lower Extremity Assessment Lower Extremity Assessment: Generalized weakness (pt unable to understand MMT; grossly 3+ to 4/5; reports tingling bil feet)    Cervical / Trunk Assessment Cervical / Trunk Assessment: Normal  Communication   Communication: Prefers language other than [place] ([name] [place])  Cognition Arousal/Alertness: Awake/alert Behavior During Therapy: Flat affect Overall Cognitive Status: No family/caregiver present to determine baseline cognitive functioning                                 General Comments: occasionally stops talking and moving, then states he needs a rest despite no increase in HR or RR        General Comments General comments (skin integrity, edema, etc.): HR 60s, BP after walk 159/79    Exercises     Assessment/Plan    PT Assessment Patient needs continued PT services  PT Problem List Decreased strength;Decreased activity tolerance;Decreased balance;Decreased mobility;Decreased cognition;Decreased knowledge of use of DME;Decreased safety awareness;Impaired sensation;Pain       PT  Treatment Interventions DME instruction;Gait training;Stair training;Functional mobility training;Therapeutic activities;Therapeutic exercise;Cognitive remediation;Patient/family education    PT Goals (Current goals can be found in the Care Plan section)  Acute Rehab PT Goals Patient Stated Goal: go home today PT Goal Formulation: With patient Time For Goal Achievement: 08/13/21 Potential to Achieve Goals: Good    Frequency Min 3X/week   Barriers to discharge        Co-evaluation               AM-PAC PT "6 Clicks" Mobility  Outcome Measure Help needed turning from your back to your side while in a flat bed without using bedrails?: None Help needed moving from lying on your back to sitting on the side of a flat bed without using bedrails?: A Little Help needed moving to and from a bed to a chair (including a wheelchair)?: A Little Help needed standing up from a chair using your arms (e.g., wheelchair or bedside chair)?: A Little Help needed to walk in hospital room?: A Little Help needed climbing 3-5 steps with a railing? : A Little 6 Click Score: 19    End of Session   Activity Tolerance: Patient limited by fatigue Patient left: in bed;with call bell/phone within reach;with bed alarm set Nurse Communication: Mobility status PT Visit Diagnosis: Unsteadiness on feet (R26.81);Muscle weakness (generalized) (M62.81)    Time: 6606-0045 PT Time Calculation (min) (ACUTE ONLY): 29 min   Charges:   PT Evaluation $PT Eval Low Complexity: 1 Low  Kenneth [name], PT Acute Rehabilitation Services  Pager [phone] Office [phone]   Kenneth [name] 07/30/2021, 9:06 AM

## 2021-07-30 NOTE — Progress Notes (Signed)
[facility] Progress Note  Subjective: pt seen on HD.  We had arranged for OP HD today, but we had a miscommunication and patient is now on HD. Should be going home after HD here.   Vitals:   07/30/21 1030 07/30/21 1100 07/30/21 1130 07/30/21 1200  BP: (!) 152/70 (!) 158/66 (!) 140/57 (!) 141/65  Pulse: (!) 54 (!) 54 (!) 53 (!) 56  Resp: 11 11 (!) 22 13  Temp:      TempSrc:      SpO2:      Weight:      Height:        Exam:   alert, nad   no jvd  Chest cta bilat to bases  Cor reg no RG  Abd soft ntnd no ascites   Ext no LE edema   Alert, NF, ox3   LUA AVF+bruit   Home meds include - eliquis, lipitor, phoslo 1 ac tid, celexa, zetia, neurontin, hydralazine 100 tid, insulin glargine/ pump, imdur, losartan, novolog tid ac, protonix, lokelma TTS (stopped 2 wks ago), vits/ supps/ prns      OP HD: MWF SW   3h 29min  400/500   59kg  2/2 bath  P4  Hep none  - sensipar 60 tiw  - hect 2 ug tiw  - aranesp 200 weekly last 11/9, due 11/16     Assessment/ Plan: Severe hyperkalemia - resolved. We have resume his TTS 10 gm lokelma which he needs.  Seizures - seen by neuro, sp EEG Uveitis - taking atovaquone for toxo uveitis. Also +QuantiFERON. Per pmd.  Hx ICM/ sp CABG 2017 - now w/ preserved EF ESRD - on HD MWF. HD today.  CAD / hx atrial fib Vol /HTN - BP's wnl here, no resp issues. CXR neg. UF 3 L as tol Anemia ckd - esa due 11/16. Hb low 7.8, follow.  MBD ckd - Ca ok, cont binders, vdra and sensipar Dispo - stable for d/c from renal standpoint           Rob Natally Ribera 07/30/2021, 12:20 PM   Recent Labs  Lab 07/29/21 0355 07/30/21 0048  K 4.6 4.7  BUN 21 34*  CREATININE 5.46* 7.61*  CALCIUM 8.3* 8.3*  PHOS 4.7* 4.9*  HGB 7.8* 8.4*    Inpatient medications:  atorvastatin  80 mg Oral Daily   atovaquone  1,500 mg Oral BID WC   Chlorhexidine Gluconate Cloth  6 each Topical Q0600   cinacalcet  60 mg Oral Q M,W,F-HD   doxercalciferol  2 mcg  Intravenous Q M,W,F-HD   ezetimibe  10 mg Oral Daily   gabapentin  100 mg Oral QHS   hydrALAZINE  100 mg Oral 3 times per day on Sun Tue Thu Sat   insulin aspart  0-5 Units Subcutaneous QHS   insulin aspart  0-6 Units Subcutaneous TID WC   levETIRAcetam  250 mg Oral Q M,W,F-2000   levETIRAcetam  500 mg Oral Daily   sodium zirconium cyclosilicate  10 g Oral Q K,DT,OIZ-1245     LORazepam

## 2021-07-30 NOTE — Evaluation (Signed)
Occupational Therapy Evaluation Patient Details Name: Kenneth [name] MRN: [id number] DOB: 01-23-1956 Today's Date: 07/30/2021   History of Present Illness 65 year old male who was transferred 07/28/21  to [facility] emergency department  from his dialysis center for shortness of breath and weakness and was found to have severe hyperkalemia to 7.5 with EKG changes and hypothermia. +seizure in ED; head CT negative;   PMH- end-stage renal disease on dialysis, ischemic cardiomyopathy, Toxoplasma uveitis, latent TB, and hypertension   Clinical Impression   Patient admitted for the diagnosis above.  PTA he lives with his spouse, who dose assist him with bathing, dressing, meals and community mobility.  Deficits impacting functions are listed below/  Currently he is complaining of generalized weakness and back pain.  OT will follow in the acute setting, but home with [facility] OT when cleared medically is recommended.        Recommendations for follow up therapy are one component of a multi-disciplinary discharge planning process, led by the attending physician.  Recommendations may be updated based on patient status, additional functional criteria and insurance authorization.   Follow Up Recommendations  Home health OT    Assistance Recommended at Discharge    Functional Status Assessment  Patient has had a recent decline in their functional status and demonstrates the ability to make significant improvements in function in a reasonable and predictable amount of time.  Equipment Recommendations  None recommended by OT    Recommendations for Other Services       Precautions / Restrictions Precautions Precautions: Fall Restrictions Weight Bearing Restrictions: No      Mobility Bed Mobility Overal bed mobility: Needs Assistance Bed Mobility: Supine to Sit;Sit to Supine     Supine to sit: Supervision Sit to supine: Supervision   General bed mobility comments: supervision for safety due to  dizziness Patient Response: Flat affect  Transfers Overall transfer level: Needs assistance Equipment used: Rolling walker (2 wheels) Transfers: Sit to/from Stand Sit to Stand: Min guard;+2 safety/equipment           General transfer comment: vc for fully upright (tends to look down, hang head down)      Balance Overall balance assessment: Needs assistance Sitting-balance support: No upper extremity supported;Feet supported Sitting balance-Leahy Scale: Fair Sitting balance - Comments: with overhead reaching with posterior imbalance   Standing balance support: Bilateral upper extremity supported;Reliant on assistive device for balance Standing balance-Leahy Scale: Poor                             ADL either performed or assessed with clinical judgement   ADL       Grooming: Wash/dry hands;Wash/dry face;Supervision/safety;Sitting       Lower Body Bathing: Minimal assistance;Bed level       Lower Body Dressing: Minimal assistance;Bed level   Toilet Transfer: Minimal assistance;Regular Toilet;Rolling walker (2 wheels)             General ADL Comments: difficulty walking functional distances.     Vision Baseline Vision/History: 1 Wears glasses Ability to See in Adequate Light: 0 Adequate Patient Visual Report: No change from baseline       Perception Perception Perception: Not tested   Praxis Praxis Praxis: Not tested    Pertinent Vitals/Pain Pain Assessment: Faces Faces Pain Scale: Hurts a little bit Pain Location: low back Pain Descriptors / Indicators: Aching Pain Intervention(s): Monitored during session     Hand Dominance Right  Extremity/Trunk Assessment Upper Extremity Assessment Upper Extremity Assessment: Generalized weakness   Lower Extremity Assessment Lower Extremity Assessment: Defer to PT evaluation   Cervical / Trunk Assessment Cervical / Trunk Assessment: Normal   Communication Communication Communication:  Prefers language other than English   Cognition Arousal/Alertness: Awake/alert Behavior During Therapy: Flat affect Overall Cognitive Status: No family/caregiver present to determine baseline cognitive functioning                                      General Comments  HR 60s, BP after walk 159/79    Exercises     Shoulder Instructions      Home Living Family/patient expects to be discharged to:: Private residence Living Arrangements: Spouse/significant other Available Help at Discharge: Family;Available 24 hours/day Type of Home: House Home Access: Stairs to enter [employer] of Steps: 5 Entrance Stairs-Rails: Right Home Layout: Two level;Bed/bath upstairs Alternate Level Stairs-Number of Steps: 15 Alternate Level Stairs-Rails: Right Bathroom Shower/Tub: Occupational psychologist: Standard Bathroom Accessibility: Yes   Home Equipment: Cane - single Associate Professor (2 wheels);Shower seat;Grab bars - tub/shower   Additional Comments: utilized interpreter throughout session      Prior Functioning/Environment Prior Level of Function : Needs assist       Physical Assist : Mobility (physical);ADLs (physical) Mobility (physical): Stairs ADLs (physical): Bathing;Dressing Mobility Comments: uses cane; wife assists on stairs ADLs Comments: wife assists with bathing and dressing        OT Problem List: Decreased strength;Decreased range of motion;Decreased activity tolerance;Impaired balance (sitting and/or standing);Pain      OT Treatment/Interventions: Self-care/ADL training;Therapeutic exercise;Therapeutic activities;Balance training    OT Goals(Current goals can be found in the care plan section) Acute Rehab OT Goals Patient Stated Goal: wants to return home OT Goal Formulation: With patient Time For Goal Achievement: 08/13/21 Potential to Achieve Goals: Good ADL Goals Pt Will Perform Grooming: with modified  independence;standing Pt Will Perform Lower Body Bathing: with supervision;sit to/from stand Pt Will Perform Lower Body Dressing: with supervision;sit to/from stand Pt Will Transfer to Toilet: with modified independence;ambulating;regular height toilet Pt/caregiver will Perform Home Exercise Program: Increased strength;Both right and left upper extremity;With written HEP provided;With Supervision  OT Frequency: Min 2X/week   Barriers to D/C: Decreased caregiver support          Co-evaluation PT/OT/SLP Co-Evaluation/Treatment: Yes Reason for Co-Treatment: For patient/therapist safety   OT goals addressed during session: ADL's and self-care      AM-PAC OT "6 Clicks" Daily Activity     Outcome Measure Help from another person eating meals?: None Help from another person taking care of personal grooming?: A Little Help from another person toileting, which includes using toliet, bedpan, or urinal?: A Lot Help from another person bathing (including washing, rinsing, drying)?: A Lot Help from another person to put on and taking off regular upper body clothing?: A Little Help from another person to put on and taking off regular lower body clothing?: A Lot 6 Click Score: 16   End of Session Equipment Utilized During Treatment: Gait belt Nurse Communication: Mobility status  Activity Tolerance: Patient tolerated treatment well Patient left: in bed;with call bell/phone within reach;with bed alarm set  OT Visit Diagnosis: Unsteadiness on feet (R26.81);Pain                Time: 1610-9604 OT Time Calculation (min): 29 min Charges:  OT General Charges $OT  Visit: 1 Visit OT Evaluation $OT Eval Moderate Complexity: 1 Mod  07/30/2021  RP, OTR/L  Acute Rehabilitation Services  Office:  [phone]   Metta Clines 07/30/2021, 9:23 AM

## 2021-07-30 NOTE — Progress Notes (Addendum)
Pt's chart indicates possible d/c today. Pt's clinic can provide out-pt HD treatment if pt is able to arrive by 11:45-12:00. Contacted provider to see if pt stable for d/c this am if d/c could occur early in order for pt to make it to out-pt clinic. Awaiting a response.    Melven Sartorius Renal Navigator [phone]  Addendum at 9:56 am: Received message that attending to f/u with Nephrology re: HD inpt vs out-pt. Went to pt's room to meet with pt at bedside. Pt not in the room. Spoke to pt's RN who states pt is in HD for treatment. Advised that providers preferred pt to receive treatment prior to d/c. Pt's clinic aware of pt's planned d/c for today and pt to resume on Friday.

## 2021-07-30 NOTE — Progress Notes (Signed)
Patient discharging home with wife. Vital signs stable at time of discharge as reflected in discharge summary. Discharge instructions given via interpretor, verbal understanding returned. No questions at this time. Follow up appointments scheduled.

## 2021-07-31 LAB — TOXOPLASMA GONDII, PCR: Toxoplasma Gondii, PCR: NEGATIVE

## 2021-08-02 LAB — CULTURE, BLOOD (ROUTINE X 2)
Culture: NO GROWTH
Special Requests: ADEQUATE

## 2021-08-05 NOTE — Progress Notes (Addendum)
PCP: Dr. Tasia Catchings [facility]) Cardiology: Dr. Aundra Dubin  65 y.o. 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  He was admitted in 9/17 with NSTEMI.  LHC showed 3VD and he had CABG x 3.  He had CKD stage IV when he was admitted and developed ESRD requiring HD while in the hospital.  He had transient atrial fibrillation post-CABG.  He developed cardiogenic shock requiring milrinone but was able to taper off.  Echo in 9/17 showed EF 35-40%.    Admitted 8/1 through 04/16/2017 with malignant hypertension due to medication noncompliance. Continues on HD.  HF M-W-F. Discharge weight 140 pounds.   Admitted 10/18 with chest pain and pulmonary edema. NSTEMI noted on admit. Echo showed preserved EF but + WMAs. Cath with 95% ostial SVG-OM1 stenosis with TIMI 2 flow in graft.  Patient had DES to SVG-OM1.   Admitted in 12/18 with atypical chest pain, suspect not cardiac.   NSTEMI in 7/19 with severe in-stent restenosis in SVG-OM1, had DES x 2 to native OM1.  Echo showed EF 50%.  Atrial fibrillation was noted this admission.   He has had a chronic pattern of atypical chest pain.  Cardiolite in 3/20 showed EF 54%, small fixed inferior defect with no ischemia.   He was admitted in 8/20 with severe episode of chest pain.  Hs-TnI was mildly elevated without trend.  Cath was done, showing no target for PCI and stable disease. Echo was done showing EF 50-55% with moderate LVH and normal RV.    He was admitted in 4/22 with CHF and chest pain, elevated HS-TnI to 1673.  Volume overloaded treated with daily HD in hospital.  He had RHC/LHC done after he had been dialyzed x 2, showing normal filling pressures and unchanged coronary anatomy.  Echo showed EF 55-60%, RV normal.   Most recent echo 11/22 EF 60-65%, GIIDD, RV normal.    Admitted 11/22 after he became unresponsive at HD w/ ? Seizure like activity. K >7.5 (lokelma had  been previously stopped). Given Loklema 10 g + calcium gluconate + insulin. Also w/ fluid overload and given IV Lasix. Nephrology consulted and started urgent HD. HF consulted and recommended dry weight be lowered with NYHA III symptoms. Losartan stopped at discharge. 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 Discharged home with [facility] PT/[facility], weight 136.8 lbs.  Today he returns for post hospitalization HF follow up with his wife and interpretor. He feels poorly. He was told he needed inhalers by a provider and would like to be referred to Pulmonologist. He continues to have chest discomfort  and is dyspneic with minimal activity. He is dizzy and feels like his face is more swollen. He tells me HD is not taking off as much fluid as he feels they should be. He continues to urinate some, once in the mornings. Denies palpitations, edema, or PND/Orthopnea. Appetite ok. No fever or chills. Taking all medications.   ECG (personally reviewed): SB, slower than previous tracing, but otherwise no significant ST-T abnormalities/changes.  Labs (12/18): LDL 64, HDL 31 Labs (7/19): LDL 36  Labs (2/20): hgb 11.3, LDL 59, HDL 28 Labs (2/22): LDL 52, HDL 37 Labs (4/22): hgb 9.9 Labs (7/22): hgb 10.9 Labs (10/22): HDL 46, LDL 48 Labs (11/22): K 4.7, hgb 8.4  PMH: 1. Type II diabetes 2. HTN 3. ESRD 4. CAD: NSTEMI 9/17 with CABG x 3 (LIMA-LAD, SVG-OM, SVG-RCA).   -  NSTEMI 10/18 s/p DES to ostial SVG-OM - NSTEMI 7/19: severe in-stent restenosis in SVG-ramus, chronic total occlusion of SVG-RCA.  DES x 2 to native ramus.  - Cardiolite (3/20): EF 54%, small fixed inferior defect with no ischemia.  - LHC (8/20): 90% pLAD, subtotal occlusion mLAD, LIMA-LAD patent, occluded SVG-high OM1, 40-50% in-stent restenosis in native OM1, 80% ostial small AV LCx (not target for intervention), occluded RCA, occluded SVG-RCA (chronic with left=>right collaterals).  No target for intervention.  - LHC (4/22): 90% pLAD,  subtotal occlusion mLAD, LIMA-LAD patent, occluded SVG-high OM1, 50% in-stent restenosis in native OM1, 85% ostial small AV LCx (not target for intervention), occluded RCA, occluded SVG-RCA (chronic with left=>right collaterals).  No target for intervention.  5. Chronic systolic CHF: Ischemic cardiomyopathy.   - Echo (9/17) with EF 35-40%, grade II diastolic dysfunction.  - Echo (1/18) with EF 60-65%, grade II diastolic dysfunction. - Echo (10/18) with EF 55%, Akinesis of the basal-midinferolateral, inferior, and inferoseptal myocardiam. Akinesis of the basal anteroseptal myocardium.  - Echo (7/19): EF 50%, moderate LVH, mildly decreased RV systolic function.  - Echo (8/20): EF 50-55%, moderate LVH, normal RV - Echo (4/22): EF 55-60%, normal RV - Echo (11/22): EF 60-65%, RV normal. 6. Atrial fibrillation: Paroxysmal.  Only noted post-op CABG.  7. ABIs normal in 12/17. 5/18 ABIs normal.  1/19 ABIs normal. 4/20 ABIs normal.  8. Carotid stenosis: Carotid dopplers (12/18) with < 50% right common carotid stenosis, 5-17% LICA stenosis.  - Carotid dopplers (1/20): < 50% BICA stenosis.  - Carotid dopplers (5/21): Mild bilateral disease.  9. Atrial fibrillation: Paroxysmal.  10. 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 Sciatica  SH: Married, lives in [place], 1 son, nonsmoker, no ETOH, speaks only [place].   FH: No premature CAD.  Review of systems complete and found to be negative unless listed in HPI.    Current Outpatient Medications  Medication Sig Dispense Refill   acetaminophen (TYLENOL) 500 MG tablet Take 1,000 mg by mouth every 6 (six) hours as needed for mild pain or headache.      apixaban (ELIQUIS) 5 MG TABS tablet Take 1 tablet (5 mg total) by mouth 2 (two) times daily. 60 tablet 6   Ascorbic Acid (VITAMIN C) 1000 MG tablet Take 1,000 mg by mouth daily.     atorvastatin (LIPITOR) 80 MG tablet Take 1 tablet by mouth once daily (Patient taking differently:  Take 80 mg by mouth daily.) 90 tablet 0   atovaquone (MEPRON) 750 MG/5ML suspension Take 1,500 mg by mouth 2 (two) times daily with a meal.     calcium acetate (PHOSLO) 667 MG capsule Take 667 mg by mouth 3 (three) times daily with meals.      citalopram (CELEXA) 20 MG tablet Take 20 mg by mouth daily with lunch.   1   ezetimibe (ZETIA) 10 MG tablet Take 10 mg by mouth daily.     gabapentin (NEURONTIN) 100 MG capsule Take 1 capsule (100 mg total) by mouth at bedtime.     glucose blood (CONTOUR NEXT TEST) test strip Test BS 5 times a day     hydrALAZINE (APRESOLINE) 100 MG tablet Take 1 tablet (100 mg total) by mouth 3 (three) times daily. DO NOT TAKE AM AND NOON DOSES ON HD DAYS (Patient taking differently: Take 100 mg by mouth See admin instructions. Takes 100 mg once daily on Monday, Wednesday and Friday. Takes 100 mg 3 times a day on all  other days.)     Insulin Human (INSULIN PUMP) SOLN Inject 1 each into the skin continuous. NOVOLOG - 20 units     isosorbide mononitrate (IMDUR) 60 MG 24 hr tablet Take 1 tablet (60 mg total) by mouth daily. DO NOT TAKE ON HD DAYS (Patient taking differently: Take 60 mg by mouth daily.)     levETIRAcetam (KEPPRA) 250 MG tablet Take 1 tablet (250 mg total) by mouth every Monday, Wednesday, and Friday at 8 PM. 12 tablet 0   levETIRAcetam (KEPPRA) 500 MG tablet Take 1 tablet (500 mg total) by mouth daily. 30 tablet 0   Methoxy PEG-Epoetin Beta (MIRCERA IJ) Mircera     NOVOLOG 100 UNIT/ML injection Inject 15-20 Units into the skin 3 (three) times daily with meals. 15-20 units via pump     pantoprazole (PROTONIX) 40 MG tablet Take 40 mg by mouth daily.  0   sodium zirconium cyclosilicate (LOKELMA) 10 g PACK packet Take 10 g by mouth every Tuesday, Thursday, and Saturday at 6 PM. 12 packet 0   vitamin B-12 (CYANOCOBALAMIN) 500 MCG tablet Take 1 tablet (500 mcg total) by mouth daily. 30 tablet 0   No current facility-administered medications for this encounter.   BP  (!) 150/52   Pulse 60   Wt 62.5 kg   SpO2 95%   BMI 20.35 kg/m   Wt Readings from Last 3 Encounters:  08/12/21 62.5 kg (137 lb 12.8 oz)  07/30/21 58.2 kg (128 lb 4.9 oz)  07/10/21 64.9 kg (143 lb)   General:  NAD. No resp difficulty HEENT: Normal Neck: Supple. No JVD. Carotids 2+ bilat; no bruits. No lymphadenopathy or thryomegaly appreciated. Cor: PMI nondisplaced. Regular rate & rhythm. No rubs, gallops or murmurs. Lungs: Clear Abdomen: Soft, nontender, nondistended. No hepatosplenomegaly. No bruits or masses. Good bowel sounds. Extremities: No cyanosis, clubbing, rash, edema; LUE AVF +/+ Neuro: Alert & oriented x 3, cranial nerves grossly intact. Moves all 4 extremities w/o difficulty. Affect pleasant.  Assessment/Plan: 1. CAD: s/p CABG.  NSTEMI 10/18, cath with 95% ostial SVG-high OM1 stenosis with TIMI 2 flow in graft, s/p DES to SVG-OM1 07/07/17. NSTEMI 7/19 with in-stent restenosis in the SVG-ramus, had DES x 2 to native high OM1.   He has had chronic atypical chest pain episodes, Cardiolite in 3/20 showed no ischemia.  He had repeat cath in 8/20 showing occluded SVG-ramus (new) but continued patency of the native high OM1 s/p stenting.  No target for intervention.  LHC again in 4/22, no change from 8/20.  No intervention.  Chest pain/elevated troponin during recent admit thought to be due to volume overload/demand ischemia. He continues to have episodes of atypical chest pain, suspect nonischemic and likely related to volume.  ECG with no change from prior.    - He will continue Eliqui,s so not on ASA.  - Increase Imdur to 90 mg daily. Hold before HD. - Continue atorvastatin 80 mg daily.    2. Chronic diastolic CHF: EF low in past but improved after CABG. Echo 4/22 with EF 55-60%. Echo 11/22 EF 60-65% NYHA class III symptoms, weight is stable.  I suspect that his exertional dyspnea is related to CHF.  We discussed this today.  - He is requesting a referral to Pulmonary to discuss  inhalers, and advised to start with a discuss with his PCP. He would like to see  pulmonologist. Will arrange referral. - On dialysis for volume control => will call dialysis center and see if they  have been able to pull off desired amount of fluid.  3. Atrial fibrillation: Paroxysmal, SB on ECG today. - Continue Eliquis 5 mg bid.  4. HTN: He gets lightheaded with BP drop at dialysis.  - Do NOT take the first 2 doses of hydralazine on HD days.   - Now off losartan with hyperkalemia. - Increase Imdur as above. Hold before HD. 5. ESRD: MWF. As above, may need dry weight lowered based on NYHA class III symptoms. Recent hyperkalemic emergency in setting of discontinuation of Lokelma - He needs to continue Lokelma 10 g T/T/S. - Labs per Nephrology. 6. Hyperlipidemia: Continue atorvastatin and Zetia. Lipids ok 10/22.  7. Type 2 diabetes: With diabetic neuropathy.  - He would like endocrinology referral, this has been arranged.  8. Low back pain: Chronic, followed by orthopedist in [place], wants to be seen in [place].  - Has been referred to [facility].  9. Seizure disorder: Needs follow up with Neurology. No further seizures since hospitalization. - Continue Keppra. - No driving.  Follow up with Dr. Aundra Dubin in 2-3 months  Kenneth [name] 08/12/2021  Addendum: Kenneth [name] with Kenneth Skye, RN at [employer] [facility] [employer]. Patient's dry weight is 59 kg and he left HD today 08/03/21 at 60.5 kg. Looking back at weights from this past week, Kenneth [name] says pt has not been at dry weight at conclusion of dialysis session, up ~3 lbs. Also say BP have been higher this week.

## 2021-08-12 ENCOUNTER — Other Ambulatory Visit: Payer: Self-pay

## 2021-08-12 ENCOUNTER — Ambulatory Visit (HOSPITAL_COMMUNITY)
Admission: RE | Admit: 2021-08-12 | Discharge: 2021-08-12 | Disposition: A | Discharge status: Home / Self Care | Payer: Medicare Other | Source: Ambulatory Visit | Attending: Family Medicine | Admitting: Family Medicine

## 2021-08-12 ENCOUNTER — Encounter (HOSPITAL_COMMUNITY): Payer: Self-pay

## 2021-08-12 VITALS — BP 150/52 | HR 60 | Wt 137.8 lb

## 2021-08-12 DIAGNOSIS — I5032 Chronic diastolic (congestive) heart failure: Secondary | ICD-10-CM | POA: Diagnosis not present

## 2021-08-12 DIAGNOSIS — I10 Essential (primary) hypertension: Secondary | ICD-10-CM

## 2021-08-12 DIAGNOSIS — Z79899 Other long term (current) drug therapy: Secondary | ICD-10-CM | POA: Diagnosis not present

## 2021-08-12 DIAGNOSIS — R42 Dizziness and giddiness: Secondary | ICD-10-CM | POA: Diagnosis present

## 2021-08-12 DIAGNOSIS — G8929 Other chronic pain: Secondary | ICD-10-CM

## 2021-08-12 DIAGNOSIS — I132 Hypertensive heart and chronic kidney disease with heart failure and with stage 5 chronic kidney disease, or end stage renal disease: Secondary | ICD-10-CM | POA: Insufficient documentation

## 2021-08-12 DIAGNOSIS — I252 Old myocardial infarction: Secondary | ICD-10-CM | POA: Diagnosis not present

## 2021-08-12 DIAGNOSIS — R0602 Shortness of breath: Secondary | ICD-10-CM | POA: Insufficient documentation

## 2021-08-12 DIAGNOSIS — Z7901 Long term (current) use of anticoagulants: Secondary | ICD-10-CM | POA: Diagnosis not present

## 2021-08-12 DIAGNOSIS — Z955 Presence of coronary angioplasty implant and graft: Secondary | ICD-10-CM | POA: Insufficient documentation

## 2021-08-12 DIAGNOSIS — R0789 Other chest pain: Secondary | ICD-10-CM | POA: Diagnosis not present

## 2021-08-12 DIAGNOSIS — Z992 Dependence on renal dialysis: Secondary | ICD-10-CM | POA: Diagnosis not present

## 2021-08-12 DIAGNOSIS — N186 End stage renal disease: Secondary | ICD-10-CM | POA: Diagnosis not present

## 2021-08-12 DIAGNOSIS — E1122 Type 2 diabetes mellitus with diabetic chronic kidney disease: Secondary | ICD-10-CM | POA: Insufficient documentation

## 2021-08-12 DIAGNOSIS — M545 Low back pain, unspecified: Secondary | ICD-10-CM | POA: Insufficient documentation

## 2021-08-12 DIAGNOSIS — I2510 Atherosclerotic heart disease of native coronary artery without angina pectoris: Secondary | ICD-10-CM | POA: Diagnosis not present

## 2021-08-12 DIAGNOSIS — I5042 Chronic combined systolic (congestive) and diastolic (congestive) heart failure: Secondary | ICD-10-CM | POA: Diagnosis not present

## 2021-08-12 DIAGNOSIS — I480 Paroxysmal atrial fibrillation: Secondary | ICD-10-CM | POA: Diagnosis not present

## 2021-08-12 DIAGNOSIS — E118 Type 2 diabetes mellitus with unspecified complications: Secondary | ICD-10-CM

## 2021-08-12 DIAGNOSIS — G40909 Epilepsy, unspecified, not intractable, without status epilepticus: Secondary | ICD-10-CM

## 2021-08-12 DIAGNOSIS — R0600 Dyspnea, unspecified: Secondary | ICD-10-CM

## 2021-08-12 DIAGNOSIS — E785 Hyperlipidemia, unspecified: Secondary | ICD-10-CM | POA: Insufficient documentation

## 2021-08-12 MED ORDER — ISOSORBIDE MONONITRATE ER 60 MG PO TB24: 90.0000 mg | ORAL_TABLET | Freq: Every day | ORAL | 3 refills | Status: DC

## 2021-08-12 NOTE — Patient Instructions (Addendum)
INCREASE Isosorbide to 90 mg (one and one half tab) daily  Your physician recommends that you schedule a follow-up appointment in: 2-3 months with Dr Aundra Dubin  Do the following things EVERYDAY: Weigh yourself in the morning before breakfast. Write it down and keep it in a log. Take your medicines as prescribed Eat low salt foods--Limit salt (sodium) to 2000 mg per day.  Stay as active as you can everyday Limit all fluids for the day to less than 2 liters  At the [facility], you and your health needs are our priority. As part of our continuing mission to provide you with exceptional heart care, we have created designated Provider Care Teams. These Care Teams include your primary Cardiologist (physician) and Advanced Practice Providers (APPs- Physician Assistants and Nurse Practitioners) who all work together to provide you with the care you need, when you need it.   You may see any of the following providers on your designated Care Team at your next follow up: Dr Glori Bickers Dr Haynes Kerns, NP Lyda Jester, [place] [facility] [place], [place] Audry Riles, PharmD   Please be sure to bring in all your medications bottles to every appointment.   If you have any questions or concerns before your next appointment please send [place] a message through [place] or call our office at [phone].    TO LEAVE A MESSAGE FOR THE NURSE SELECT OPTION 2, PLEASE LEAVE A MESSAGE INCLUDING: YOUR NAME DATE OF BIRTH CALL BACK NUMBER REASON FOR CALL**this is important as we prioritize the call backs  YOU WILL RECEIVE A CALL BACK THE SAME DAY AS LONG AS YOU CALL BEFORE 4:00 PM

## 2021-08-13 NOTE — Addendum Note (Signed)
Encounter addended by: Rafael Bihari, FNP on: 08/13/2021 4:23 PM  Actions taken: Clinical Note Signed

## 2021-08-14 ENCOUNTER — Ambulatory Visit: Payer: Medicare Other | Admitting: Physical Therapy

## 2021-08-15 ENCOUNTER — Telehealth: Payer: Self-pay | Admitting: Family Medicine

## 2021-08-15 NOTE — Telephone Encounter (Signed)
OK 

## 2021-08-15 NOTE — Telephone Encounter (Signed)
Kenneth [name] kin the interpreter and friend for this patient is wanting for Kenneth [name] to become a new patient for Dr. Nani Ravens. She states that she already discussed this on her OV with him. Is this ok?      [name]  Phone: [phone]

## 2021-08-18 NOTE — Progress Notes (Signed)
[facility] [place]  ASSESSMENT / PLAN: Kenneth [name] is a 65 y.o. male with lower extremity pain. Patient has no evidence of hemodynamically significant arterial disease on exam or non-invasive testing. His symptoms seem most typical of neuropathy. Recommend follow up with primary care. Follow up with me as needed.  CHIEF COMPLAINT: Bilateral lower extremity pain  HISTORY OF PRESENT ILLNESS: Kenneth [name] is a 65 y.o. male well-known to our service for hemodialysis access, who presents to the clinic for evaluation of lower extremity pain.  The patient reports a fairly typical constellation of neuropathic symptoms.  He reports burning and electric discomfort in his feet which will come and go.  He reports no symptoms typical of claudication.  He can walk, but gets tired easily.  He does not have ischemic rest pain symptoms.  He has no ulcers about his feet.  Past Medical History:  Diagnosis Date   Acute respiratory failure with hypoxemia ([place]) 05/31/2016   CAD (coronary artery disease) 10/30/2016   NSTEMI 9/17 with CABG x 3 (LIMA-LAD, SVG-OM, SVG-RCA).   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   type 1 DM   ESRD on hemodialysis ([place])    "MWF; [facility]" (03/30/2018)   Gastroesophageal reflux disease 08/18/2016   HCAP (healthcare-associated pneumonia)    Hyperlipidemia    Hypertension    Hypertensive heart disease 09/12/2017   Ischemic rest pain of lower extremity 02/06/2015   Keratoma 02/27/2015   Metatarsal deformity 02/27/2015   NSTEMI (non-ST elevated myocardial infarction) (HCC)    PAF (paroxysmal atrial fibrillation) (HCC)    Pleural effusion    Pronation deformity of both feet 02/27/2015    Past Surgical History:  Procedure Laterality Date   A/V FISTULAGRAM Left 03/06/2021   Procedure: A/V FISTULAGRAM;  Surgeon: Marty Heck, MD;  Location: [facility];  Service: Cardiovascular;  Laterality: Left;   AV FISTULA PLACEMENT Left 06/22/2016   Procedure: ARTERIOVENOUS (AV) FISTULA CREATION LEFT UPPER ARM;  Surgeon: Rosetta Posner, MD;  Location: [facility];  Service: Vascular;  Laterality: Left;   BIOPSY  12/19/2020   Procedure: BIOPSY;  Surgeon: Ladene Artist, MD;  Location: [facility];  Service: Endoscopy;;   CARDIAC CATHETERIZATION N/A 05/31/2016   Procedure: Left Heart Cath and Coronary Angiography;  Surgeon: Jettie Booze, MD;  Location: [facility];  Service: Cardiovascular;  Laterality: N/A;   CARDIAC CATHETERIZATION N/A 05/31/2016   Procedure: Right Heart Cath;  Surgeon: Jettie Booze, MD;  Location: [facility];  Service: Cardiovascular;  Laterality: N/A;   CARDIAC CATHETERIZATION N/A 05/31/2016   Procedure: IABP Insertion;  Surgeon: Jettie Booze, MD;  Location: [facility];  Service: Cardiovascular;  Laterality: N/A;   COLONOSCOPY WITH PROPOFOL N/A 12/20/2020   Procedure: COLONOSCOPY WITH PROPOFOL;  Surgeon: Mauri Pole, MD;  Location: [facility];  Service: Endoscopy;  Laterality: N/A;   CORONARY ARTERY BYPASS GRAFT N/A 06/05/2016   Procedure: CORONARY ARTERY BYPASS GRAFTING (CABG) x3 LIMA to LAD -SVG to OM -SVG to RCA;  Surgeon: Ivin Poot, MD;  Location: [facility];  Service: Open Heart Surgery;  Laterality: N/A;   CORONARY STENT INTERVENTION N/A 07/07/2017   Procedure: CORONARY STENT INTERVENTION;  Surgeon: Wellington Hampshire, MD;  Location: [facility];  Service: Cardiovascular;  Laterality: N/A;   CORONARY STENT INTERVENTION N/A 03/30/2018   Procedure: CORONARY STENT INTERVENTION;  Surgeon: Martinique, Peter M, MD;  Location: [facility];  Service: Cardiovascular;  Laterality: N/A;   ESOPHAGOGASTRODUODENOSCOPY (EGD) WITH PROPOFOL N/A 12/19/2020   Procedure: ESOPHAGOGASTRODUODENOSCOPY (EGD) WITH PROPOFOL;  Surgeon: Ladene Artist, MD;   Location: [facility];  Service: Endoscopy;  Laterality: N/A;   GIVENS CAPSULE STUDY N/A 12/20/2020   Procedure: GIVENS CAPSULE STUDY;  Surgeon: Mauri Pole, MD;  Location: [facility];  Service: Endoscopy;  Laterality: N/A;   INSERTION OF DIALYSIS CATHETER N/A 06/05/2016   Procedure: INSERTION OF DIALYSIS/trialysis CATHETER;  Surgeon: Ivin Poot, MD;  Location: [facility];  Service: Vascular;  Laterality: N/A;   INSERTION OF DIALYSIS CATHETER Right 06/13/2016   Procedure: INSERTION OF DIALYSIS CATHETER RIGHT INTERNAL JUGULAR;  Surgeon: Conrad Washburn, MD;  Location: [facility];  Service: Vascular;  Laterality: Right;   INSERTION OF DIALYSIS CATHETER Right 09/07/2019   Procedure: Insertion Of Dialysis Catheter;  Surgeon: Rosetta Posner, MD;  Location: [facility];  Service: Vascular;  Laterality: Right;   INTRAOPERATIVE TRANSESOPHAGEAL ECHOCARDIOGRAM N/A 06/05/2016   Procedure: INTRAOPERATIVE TRANSESOPHAGEAL ECHOCARDIOGRAM;  Surgeon: Ivin Poot, MD;  Location: [facility];  Service: Open Heart Surgery;  Laterality: N/A;   LEFT HEART CATH AND CORS/GRAFTS ANGIOGRAPHY N/A 11/03/2016   Procedure: Left Heart Cath and Cors/Grafts Angiography;  Surgeon: Troy Sine, MD;  Location: [facility];  Service: Cardiovascular;  Laterality: N/A;   LEFT HEART CATH AND CORS/GRAFTS ANGIOGRAPHY N/A 07/07/2017   Procedure: LEFT HEART CATH AND CORS/GRAFTS ANGIOGRAPHY;  Surgeon: Larey Dresser, MD;  Location: [facility];  Service: Cardiovascular;  Laterality: N/A;   LEFT HEART CATH AND CORS/GRAFTS ANGIOGRAPHY N/A 03/30/2018   Procedure: LEFT HEART CATH AND CORS/GRAFTS ANGIOGRAPHY;  Surgeon: Martinique, Peter M, MD;  Location: [facility];  Service: Cardiovascular;  Laterality: N/A;   LEFT HEART CATH AND CORS/GRAFTS ANGIOGRAPHY N/A 04/17/2019   Procedure: LEFT HEART CATH AND CORS/GRAFTS ANGIOGRAPHY;  Surgeon: Larey Dresser, MD;  Location: [facility];  Service: Cardiovascular;  Laterality: N/A;    PERIPHERAL VASCULAR INTERVENTION Left 03/06/2021   Procedure: PERIPHERAL VASCULAR INTERVENTION;  Surgeon: Marty Heck, MD;  Location: [facility];  Service: Cardiovascular;  Laterality: Left;   POLYPECTOMY  12/20/2020   Procedure: POLYPECTOMY;  Surgeon: Mauri Pole, MD;  Location: [place] [facility];  Service: Endoscopy;;   REVISION OF ARTERIOVENOUS GORETEX GRAFT Left 09/81/1914   Procedure: PLICATION OF ANEURYSM OF ARTERIOVENOUS FISTULA  LEFT ARM;  Surgeon: Rosetta Posner, MD;  Location: [place];  Service: Vascular;  Laterality: Left;   REVISON OF ARTERIOVENOUS FISTULA Left 09/07/2019   Procedure: REVISON OF LEFT UPPER ARM ARTERIOVENOUS FISTULA;  Surgeon: Rosetta Posner, MD;  Location: [facility];  Service: Vascular;  Laterality: Left;   RIGHT/LEFT HEART CATH AND CORONARY ANGIOGRAPHY N/A 01/03/2021   Procedure: RIGHT/LEFT HEART CATH AND CORONARY ANGIOGRAPHY;  Surgeon: Larey Dresser, MD;  Location: [facility];  Service: Cardiovascular;  Laterality: N/A;    Family History  Problem Relation Age of Onset   CAD Father    Colon cancer Father    Diabetes Brother     Social History   Socioeconomic History   Marital status: Married    Spouse name: Not on file   Number of children: Not on file   Years of education: Not on  file   Highest education level: Not on file  Occupational History   Not on file  Tobacco Use   Smoking status: Former    Packs/day: 1.50    Years: 35.00    Pack years: 52.50    Types: Cigarettes    Quit date: 2014    Years since quitting: 8.9   Smokeless tobacco: Never   Tobacco comments:    "quit e-cigs ~ 2014"  Vaping Use   Vaping Use: Former  Substance and Sexual Activity   Alcohol use: Never    Alcohol/week: 0.0 standard drinks   Drug use: Never   Sexual activity: Not on file  Other Topics Concern   Not on file  Social History Narrative   Not on file   Social Determinants of Health   Financial Resource Strain: Not on file  Food  Insecurity: Not on file  Transportation Needs: Not on file  Physical Activity: Not on file  Stress: Not on file  Social Connections: Not on file  Intimate Partner Violence: Not on file    No Known Allergies  Current Outpatient Medications  Medication Sig Dispense Refill   acetaminophen (TYLENOL) 500 MG tablet Take 1,000 mg by mouth every 6 (six) hours as needed for mild pain or headache.      apixaban (ELIQUIS) 5 MG TABS tablet Take 1 tablet (5 mg total) by mouth 2 (two) times daily. 60 tablet 6   Ascorbic Acid (VITAMIN C) 1000 MG tablet Take 1,000 mg by mouth daily.     atorvastatin (LIPITOR) 80 MG tablet Take 1 tablet by mouth once daily (Patient taking differently: Take 80 mg by mouth daily.) 90 tablet 0   calcium acetate (PHOSLO) 667 MG capsule Take 667 mg by mouth 3 (three) times daily with meals.      citalopram (CELEXA) 20 MG tablet Take 20 mg by mouth daily with lunch.   1   ezetimibe (ZETIA) 10 MG tablet Take 10 mg by mouth daily.     gabapentin (NEURONTIN) 100 MG capsule Take 1 capsule (100 mg total) by mouth at bedtime.     glucose blood (CONTOUR NEXT TEST) test strip Test BS 5 times a day     hydrALAZINE (APRESOLINE) 100 MG tablet Take 1 tablet (100 mg total) by mouth 3 (three) times daily. DO NOT TAKE AM AND NOON DOSES ON HD DAYS (Patient taking differently: Take 100 mg by mouth See admin instructions. Takes 100 mg once daily on Monday, Wednesday and Friday. Takes 100 mg 3 times a day on all other days.)     Insulin Human (INSULIN PUMP) SOLN Inject 1 each into the skin continuous. NOVOLOG - 20 units     isosorbide mononitrate (IMDUR) 60 MG 24 hr tablet Take 1.5 tablets (90 mg total) by mouth daily. 45 tablet 3   levETIRAcetam (KEPPRA) 250 MG tablet Take 1 tablet (250 mg total) by mouth every Monday, Wednesday, and Friday at 8 PM. 12 tablet 0   levETIRAcetam (KEPPRA) 500 MG tablet Take 1 tablet (500 mg total) by mouth daily. 30 tablet 0   Methoxy PEG-Epoetin Beta (MIRCERA IJ)  Mircera     NOVOLOG 100 UNIT/ML injection Inject 15-20 Units into the skin 3 (three) times daily with meals. 15-20 units via pump     pantoprazole (PROTONIX) 40 MG tablet Take 40 mg by mouth daily.  0   sodium zirconium cyclosilicate (LOKELMA) 10 g PACK packet Take 10 g by mouth every Tuesday, Thursday, and Saturday at  6 PM. 12 packet 0   vitamin B-12 (CYANOCOBALAMIN) 500 MCG tablet Take 1 tablet (500 mcg total) by mouth daily. 30 tablet 0   No current facility-administered medications for this visit.    REVIEW OF SYSTEMS:  [X]  denotes positive finding, [ ]  denotes negative finding Cardiac  Comments:  Chest pain or chest pressure:    Shortness of breath upon exertion:    Short of breath when lying flat:    Irregular heart rhythm:        Vascular    Pain in calf, thigh, or hip brought on by ambulation:    Pain in feet at night that wakes you up from your sleep:     Blood clot in your veins:    Leg swelling:         Pulmonary    Oxygen at home:    Productive cough:     Wheezing:         Neurologic    Sudden weakness in arms or legs:     Sudden numbness in arms or legs:     Sudden onset of difficulty speaking or slurred speech:    Temporary loss of vision in one eye:     Problems with dizziness:         Gastrointestinal    Blood in stool:     Vomited blood:         Genitourinary    Burning when urinating:     Blood in urine:        Psychiatric    Major depression:         Hematologic    Bleeding problems:    Problems with blood clotting too easily:        Skin    Rashes or ulcers:        Constitutional    Fever or chills:      PHYSICAL EXAM Vitals:   08/19/21 1241  BP: (!) 147/69  Pulse: (!) 59  Resp: 20  Temp: 98.6 F (37 C)  SpO2: 98%  Weight: 137 lb (62.1 kg)  Height: 5\' 9"  (1.753 m)    Constitutional: well appearing. no distress. Appears well nourished.  Neurologic: CN intact. no focal findings. no sensory loss. Psychiatric:  Mood and affect  symmetric and appropriate. Eyes:  No icterus. No conjunctival pallor. Ears, nose, throat:  mucous membranes moist. Midline trachea.  Cardiac: regular rate and rhythm.  Respiratory:  unlabored. Abdominal:  soft, non-tender, non-distended.  Peripheral vascular: 1+ DP pulses bilaterally. Feet warm and well perfused. Extremity: no edema. no cyanosis. no pallor.  Skin: no gangrene. no ulceration.  Lymphatic: no Stemmer's sign. no palpable lymphadenopathy.  PERTINENT LABORATORY AND RADIOLOGIC DATA  Most recent CBC CBC Latest Ref Rng & Units 07/30/2021 07/29/2021 07/28/2021  WBC 4.0 - 10.5 K/uL 5.0 4.3 4.1  Hemoglobin 13.0 - 17.0 g/dL 8.4(L) 7.8(L) 7.0(L)  Hematocrit 39.0 - 52.0 % 27.1(L) 25.2(L) 22.9(L)  Platelets 150 - 400 K/uL 173 150 163     Most recent CMP CMP Latest Ref Rng & Units 07/30/2021 07/29/2021 07/28/2021  Glucose 70 - 99 mg/dL 159(H) 95 210(H)  BUN 8 - 23 mg/dL 34(H) 21 18  Creatinine 0.61 - 1.24 mg/dL 7.61(H) 5.46(H) 5.02(H)  Sodium 135 - 145 mmol/L 136 136 136  Potassium 3.5 - 5.1 mmol/L 4.7 4.6 5.2(H)  Chloride 98 - 111 mmol/L 97(L) 97(L) 97(L)  CO2 22 - 32 mmol/L 28 27 28   Calcium 8.9 - 10.3 mg/dL 8.3(L)  8.3(L) 8.1(L)  Total Protein 6.5 - 8.1 g/dL - - -  Total Bilirubin 0.3 - 1.2 mg/dL - - -  Alkaline Phos 38 - 126 U/L - - -  AST 15 - 41 U/L - - -  ALT 0 - 44 U/L - - -    Renal function Estimated Creatinine Clearance: 8.7 mL/min (A) (by C-G formula based on SCr of 7.61 mg/dL (H)).  Hgb A1c MFr Bld (%)  Date Value  07/28/2021 5.3    LDL Cholesterol  Date Value Ref Range Status  07/10/2021 48 0 - 99 mg/dL Final    Comment:           Total Cholesterol/HDL:CHD Risk Coronary Heart Disease Risk Table                     Men   Women  1/2 Average Risk   3.4   3.3  Average Risk       5.0   4.4  2 X Average Risk   9.6   7.1  3 X Average Risk  23.4   11.0        Use the calculated Patient Ratio above and the CHD Risk Table to determine the patient's CHD  Risk.        ATP III CLASSIFICATION (LDL):  <100     mg/dL   Optimal  100-129  mg/dL   Near or Above                    Optimal  130-159  mg/dL   Borderline  160-189  mg/dL   High  >190     mg/dL   Very High Performed at [facility] [address], [place], [place] [address]      +-------+-----------+-----------+------------+------------+  ABI/TBIToday's ABIToday's TBIPrevious ABIPrevious TBI  +-------+-----------+-----------+------------+------------+  Right  1.12       0.91       1.04        0.85          +-------+-----------+-----------+------------+------------+  Left   1.36       0.84       1.12        0.97          +-------+-----------+-----------+------------+------------+   Yevonne Aline. Stanford Breed, MD [facility] Phone Number: [phone] 08/18/2021 9:24 PM  Total time spent on preparing this encounter including chart review, data review, collecting history, examining the patient, coordinating care for this new patient, 60 minutes.  Portions of this report may have been transcribed using voice recognition software.  Every effort has been made to ensure accuracy; however, inadvertent computerized transcription errors may still be present.

## 2021-08-19 ENCOUNTER — Ambulatory Visit (HOSPITAL_COMMUNITY)
Admission: RE | Admit: 2021-08-19 | Discharge: 2021-08-19 | Disposition: A | Discharge status: Home / Self Care | Payer: Medicare Other | Source: Ambulatory Visit | Attending: Vascular Surgery | Admitting: Vascular Surgery

## 2021-08-19 ENCOUNTER — Other Ambulatory Visit: Payer: Self-pay | Admitting: Student

## 2021-08-19 ENCOUNTER — Ambulatory Visit (INDEPENDENT_AMBULATORY_CARE_PROVIDER_SITE_OTHER): Payer: Medicare Other | Admitting: Vascular Surgery

## 2021-08-19 ENCOUNTER — Other Ambulatory Visit: Payer: Self-pay

## 2021-08-19 ENCOUNTER — Encounter: Payer: Self-pay | Admitting: Vascular Surgery

## 2021-08-19 VITALS — BP 147/69 | HR 59 | Temp 98.6°F | Resp 20 | Ht 69.0 in | Wt 137.0 lb

## 2021-08-19 DIAGNOSIS — I739 Peripheral vascular disease, unspecified: Secondary | ICD-10-CM

## 2021-08-19 DIAGNOSIS — I998 Other disorder of circulatory system: Secondary | ICD-10-CM | POA: Insufficient documentation

## 2021-08-19 DIAGNOSIS — N186 End stage renal disease: Secondary | ICD-10-CM

## 2021-08-19 DIAGNOSIS — M79606 Pain in leg, unspecified: Secondary | ICD-10-CM

## 2021-08-19 DIAGNOSIS — Z992 Dependence on renal dialysis: Secondary | ICD-10-CM | POA: Diagnosis not present

## 2021-08-19 NOTE — Telephone Encounter (Signed)
Called Bonnita Nasuti to make a new patient appointment, she stated she did not have his information but will call back with it.

## 2021-08-24 ENCOUNTER — Other Ambulatory Visit (HOSPITAL_COMMUNITY): Payer: Self-pay | Admitting: Cardiology

## 2021-08-27 ENCOUNTER — Other Ambulatory Visit: Payer: Self-pay | Admitting: Student

## 2021-09-11 ENCOUNTER — Ambulatory Visit (INDEPENDENT_AMBULATORY_CARE_PROVIDER_SITE_OTHER): Payer: Medicare Other | Admitting: Pulmonary Disease

## 2021-09-11 ENCOUNTER — Other Ambulatory Visit: Payer: Self-pay

## 2021-09-11 ENCOUNTER — Encounter: Payer: Self-pay | Admitting: Pulmonary Disease

## 2021-09-11 VITALS — BP 128/60 | HR 69 | Temp 98.3°F | Ht 64.0 in | Wt 142.0 lb

## 2021-09-11 DIAGNOSIS — R0609 Other forms of dyspnea: Secondary | ICD-10-CM

## 2021-09-11 NOTE — Patient Instructions (Addendum)
Nice to meet you  I have ordered pulmonary function test which will give [place] the most information and allow me to see if there is any way I can help with the breathing.  It is possible that cigarette smoking has caused damage to the lungs which is causing some of your symptoms.  I do think some your symptoms are related to fluid building up between dialysis sessions and the heart to be a little bit stiff causing fluid to build up on your lungs at times.  Return to clinic in the next 6 weeks with pulmonary function tests and follow-up visit with Dr. Silas Flood to discuss the results

## 2021-09-11 NOTE — Progress Notes (Signed)
@Patient  ID: Kenneth [name], male    DOB: Feb 03, 1956, 65 y.o.   MRN: [id number]  Chief Complaint  Patient presents with   Consult    Consult for SOB. Has been having SOB for a long time per patient. Walking and going up stairs makes SOB worse.     Referring provider: [name], MD  HPI:   65 y.o. man whom we are seeing in consultation for evaluation of dyspnea exertion.  Most recent cardiology note x2 reviewed.  Patient reports several year history of dyspnea exertion.  Worsened over time.  Not present on flat surfaces or relatively short distances.  Worse on inclines or stairs.  No time of day when things are better or worse.  No position where things are better or worse.  No seasonal environmental factors to get in the family seems better or worse.  No other relieving or exacerbating factors.  States he used inhaler for years ago but did not seem to help.  Notably, onset of symptoms correlate with start of hemodialysis.  Reviewed recent chest x-ray 07/2021 to monitor rotation reveals enlarged cardiac silhouette, otherwise clear lungs.  Reviewed most recent echocardiogram 07/2021 that demonstrates grade 2 diastolic function, dilated left atrium, normal RA, RV size and function, normal estimated RVSP.  Reported dry weight is 69 kg's.  Weight today 64 kg's.  PMH: ESRD on HD Monday Wednesday Friday, hypertension, hyperlipidemia, atrial fibrillation, diabetes, CAD Surgical history: AV fistula placement, CABG, Family history: Father with CAD, colon cancer, mother with diabetes Social history: Former smoker, 50+ pack year history, quit in 2014, lives in [place] / Pulmonary Flowsheets:   ACT:  No flowsheet data found.  MMRC: mMRC Dyspnea Scale mMRC Score  09/11/2021 1    Epworth:  No flowsheet data found.  Tests:   FENO:  Lab Results  Component Value Date   NITRICOXIDE TEST WILL BE CREDITED 06/09/2016    PFT: No flowsheet data found.  WALK:  No  flowsheet data found.  Imaging: Personally reviewed and as per EMR discussion of this note VAS [place] ABI WITH/WO TBI  Result Date: 08/20/2021  LOWER EXTREMITY DOPPLER STUDY Patient Name:  Kenneth [name]  Date of Exam:   08/19/2021 Medical Rec #: [id number]    Accession #:    [id number] Date of Birth: [birth year]   Patient Gender: M Patient Age:   65 years Exam Location:  [facility] Procedure:      VAS [place] ABI WITH/WO TBI Referring Phys: --------------------------------------------------------------------------------  Indications: Pain and stiffness in both legs and feet.  Vascular Interventions: L AVF. Comparison Study: 11/14/2020 Performing Technologist: Ronal Fear RVS, RCS  Examination Guidelines: A complete evaluation includes at minimum, Doppler waveform signals and systolic blood pressure reading at the level of bilateral brachial, anterior tibial, and posterior tibial arteries, when vessel segments are accessible. Bilateral testing is considered an integral part of a complete examination. Photoelectric Plethysmograph (PPG) waveforms and toe systolic pressure readings are included as required and additional duplex testing as needed. Limited examinations for reoccurring indications may be performed as noted.  ABI Findings: +---------+------------------+-----+--------+--------+  Right     Rt Pressure (mmHg) Index Waveform Comment   +---------+------------------+-----+--------+--------+  Brachial  171                                         +---------+------------------+-----+--------+--------+  PTA  192                1.12  biphasic           +---------+------------------+-----+--------+--------+  DP        186                1.09  biphasic           +---------+------------------+-----+--------+--------+  Great Toe 156                0.91                     +---------+------------------+-----+--------+--------+ +---------+------------------+-----+---------+---------+  Left      Lt  Pressure (mmHg) Index Waveform  Comment    +---------+------------------+-----+---------+---------+  PTA       233                1.36  triphasic calcified  +---------+------------------+-----+---------+---------+  DP        201                1.18  biphasic             +---------+------------------+-----+---------+---------+  Great Toe 144                0.84                       +---------+------------------+-----+---------+---------+ +-------+-----------+-----------+------------+------------+  ABI/TBI Today's ABI Today's TBI Previous ABI Previous TBI  +-------+-----------+-----------+------------+------------+  Right   1.12        0.91        1.04         0.85          +-------+-----------+-----------+------------+------------+  Left    1.36        0.84        1.12         0.97          +-------+-----------+-----------+------------+------------+   Summary: Right: Resting right ankle-brachial index is within normal range. No evidence of significant right lower extremity arterial disease. The right toe-brachial index is normal. Left: Resting left ankle-brachial index indicates noncompressible left lower extremity arteries. The left toe-brachial index is normal.  *See table(s) above for measurements and observations.  Electronically signed by Jamelle Haring on 08/20/2021 at 2:24:21 PM.    Final     Lab Results: Personally reviewed CBC    Component Value Date/Time   WBC 5.0 07/30/2021 0048   RBC 2.82 (L) 07/30/2021 0048   HGB 8.4 (L) 07/30/2021 0048   HCT 27.1 (L) 07/30/2021 0048   PLT 173 07/30/2021 0048   MCV 96.1 07/30/2021 0048   MCH 29.8 07/30/2021 0048   MCHC 31.0 07/30/2021 0048   RDW 18.6 (H) 07/30/2021 0048   LYMPHSABS 1.0 07/28/2021 0724   MONOABS 0.6 07/28/2021 0724   EOSABS 0.1 07/28/2021 0724   BASOSABS 0.1 07/28/2021 0724    BMET    Component Value Date/Time   NA 136 07/30/2021 0048   K 4.7 07/30/2021 0048   CL 97 (L) 07/30/2021 0048   CO2 28 07/30/2021 0048   GLUCOSE 159 (H)  07/30/2021 0048   BUN 34 (H) 07/30/2021 0048   CREATININE 7.61 (H) 07/30/2021 0048   CALCIUM 8.3 (L) 07/30/2021 0048   GFRNONAA 7 (L) 07/30/2021 0048   GFRAA 8 (L) 12/12/2019 1010    BNP    Component Value Date/Time   BNP 2,160.6 (H) 07/28/2021 1944  ProBNP No results found for: PROBNP  Specialty Problems       Pulmonary Problems   Acute hypoxemic respiratory failure (HCC)   Hemoptysis   Acute respiratory failure (HCC)    No Known Allergies  Immunization History  Administered Date(s) Administered   Hepatitis B, adult 12/11/2016, 01/08/2017, 02/08/2017, 06/11/2017   Hepb-cpg 07/31/2020, 08/28/2020, 10/02/2020, 01/29/2021   Influenza, Seasonal, Injecte, Preservative Fre 05/29/2017   Influenza,inj,Quad PF,6+ Mos 06/14/2019, 06/14/2019, 06/14/2020, 06/11/2021   Influenza,inj,quad, With Preservative 06/08/2018   PFIZER(Purple Top)SARS-COV-2 Vaccination 11/16/2019, 12/12/2019, 05/31/2020   Pneumococcal Conjugate-13 12/03/2017, 08/16/2019   Pneumococcal Polysaccharide-23 08/05/2016, 12/03/2017    Past Medical History:  Diagnosis Date   Acute respiratory failure with hypoxemia ([place]) 05/31/2016   CAD (coronary artery disease) 10/30/2016   NSTEMI 9/17 with CABG x 3 (LIMA-LAD, SVG-OM, SVG-RCA).   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   ESRD on hemodialysis ([place])    "MWF; [facility]" (03/30/2018)   Gastroesophageal reflux disease 08/18/2016   HCAP (healthcare-associated pneumonia)    Hyperlipidemia    Hypertension    Hypertensive heart disease 09/12/2017   Ischemic rest pain of lower extremity 02/06/2015   Keratoma 02/27/2015   Metatarsal deformity 02/27/2015   NSTEMI (non-ST elevated myocardial infarction) (HCC)    PAF (paroxysmal atrial fibrillation) (HCC)    Pleural effusion    Pronation deformity of both feet  02/27/2015    Tobacco History: Social History   Tobacco Use  Smoking Status Former   Packs/day: 1.50   Years: 35.00   Pack years: 52.50   Types: Cigarettes   Quit date: 2014   Years since quitting: 8.9  Smokeless Tobacco Never  Tobacco Comments   "quit e-cigs ~ 2014"   Counseling given: Not Answered Tobacco comments: "quit e-cigs ~ 2014"   Continue to not smoke  Outpatient Encounter Medications as of 09/11/2021  Medication Sig   acetaminophen (TYLENOL) 500 MG tablet Take 1,000 mg by mouth every 6 (six) hours as needed for mild pain or headache.    apixaban (ELIQUIS) 5 MG TABS tablet Take 1 tablet (5 mg total) by mouth 2 (two) times daily.   Ascorbic Acid (VITAMIN C) 1000 MG tablet Take 1,000 mg by mouth daily.   atorvastatin (LIPITOR) 80 MG tablet Take 1 tablet by mouth once daily (Patient taking differently: Take 80 mg by mouth daily.)   calcium acetate (PHOSLO) 667 MG capsule Take 667 mg by mouth 3 (three) times daily with meals.    citalopram (CELEXA) 20 MG tablet Take 20 mg by mouth daily with lunch.    ezetimibe (ZETIA) 10 MG tablet Take 10 mg by mouth daily.   gabapentin (NEURONTIN) 100 MG capsule Take 1 capsule (100 mg total) by mouth at bedtime.   glucose blood (CONTOUR NEXT TEST) test strip Test BS 5 times a day   hydrALAZINE (APRESOLINE) 100 MG tablet Take 1 tablet (100 mg total) by mouth 3 (three) times daily. DO NOT TAKE AM AND NOON DOSES ON HD DAYS (Patient taking differently: Take 100 mg by mouth See admin instructions. Takes 100 mg once daily on Monday, Wednesday and Friday. Takes 100 mg 3 times a day on all other days.)   Insulin Human (INSULIN PUMP) SOLN Inject 1 each into the skin continuous. NOVOLOG - 20 units  isosorbide mononitrate (IMDUR) 60 MG 24 hr tablet Take 1.5 tablets (90 mg total) by mouth daily.   Methoxy PEG-Epoetin Beta (MIRCERA IJ) Mircera   NOVOLOG 100 UNIT/ML injection Inject 15-20 Units into the skin 3 (three) times daily with meals. 15-20  units via pump   pantoprazole (PROTONIX) 40 MG tablet Take 40 mg by mouth daily.   vitamin B-12 (CYANOCOBALAMIN) 500 MCG tablet Take 1 tablet (500 mcg total) by mouth daily.   [DISCONTINUED] levETIRAcetam (KEPPRA) 250 MG tablet Take 1 tablet (250 mg total) by mouth every Monday, Wednesday, and Friday at 8 PM.   [DISCONTINUED] levETIRAcetam (KEPPRA) 500 MG tablet Take 1 tablet (500 mg total) by mouth daily.   No facility-administered encounter medications on file as of 09/11/2021.     Review of Systems  Review of Systems  Chest pain exertion.  No orthopnea or PND.  Comprehensive review of systems otherwise negative. Physical Exam  BP 128/60 (BP Location: Left Arm, Patient Position: Sitting, Cuff Size: Normal)    Pulse 69    Temp 98.3 F (36.8 C) (Oral)    Ht 5\' 4"  (1.626 m)    Wt 142 lb (64.4 kg)    SpO2 98%    BMI 24.37 kg/m   Wt Readings from Last 5 Encounters:  09/11/21 142 lb (64.4 kg)  08/19/21 137 lb (62.1 kg)  08/12/21 137 lb 12.8 oz (62.5 kg)  07/30/21 128 lb 4.9 oz (58.2 kg)  07/10/21 143 lb (64.9 kg)    BMI Readings from Last 5 Encounters:  09/11/21 24.37 kg/m  08/19/21 20.23 kg/m  08/12/21 20.35 kg/m  07/30/21 18.95 kg/m  07/10/21 23.08 kg/m     Physical Exam General: Well-appearing, no acute distress Eyes: EOMI, icterus Neck: Supple, no JVP Pulmonary: Clear, no crackles, no work of breathing Cardiovascular regular rate and rhythm, no murmur Abdomen: Nondistended, bowel sounds present MSK: No synovitis, no joint effusion Neuro: Normal gait, no weakness Psych: Normal mood, full affect   Assessment & Plan:   Dyspnea on exertion: Likely multifactorial related to relative deconditioning since starting dialysis, intermittent volume overload between dialysis sessions with proclivity to fluid overload given diastolic function left atrial dilation, stable chronic anemia developed in the setting of his end-stage renal disease.  Dry weight is up 5 kg from  reported dry weight.  Possible contribution due to damage from cigarette smoking, 50+ pack year history.  PFTs ordered for further evaluation.  Consider bronchodilator therapy pending results of PFTs.   Return in about 6 weeks (around 10/23/2021).   Lanier Clam, MD 09/11/2021

## 2021-09-15 DIAGNOSIS — Z992 Dependence on renal dialysis: Secondary | ICD-10-CM | POA: Diagnosis not present

## 2021-09-15 DIAGNOSIS — E875 Hyperkalemia: Secondary | ICD-10-CM | POA: Diagnosis not present

## 2021-09-15 DIAGNOSIS — E1122 Type 2 diabetes mellitus with diabetic chronic kidney disease: Secondary | ICD-10-CM | POA: Diagnosis not present

## 2021-09-15 DIAGNOSIS — Z23 Encounter for immunization: Secondary | ICD-10-CM | POA: Diagnosis not present

## 2021-09-15 DIAGNOSIS — N186 End stage renal disease: Secondary | ICD-10-CM | POA: Diagnosis not present

## 2021-09-15 DIAGNOSIS — D631 Anemia in chronic kidney disease: Secondary | ICD-10-CM | POA: Diagnosis not present

## 2021-09-15 DIAGNOSIS — N2581 Secondary hyperparathyroidism of renal origin: Secondary | ICD-10-CM | POA: Diagnosis not present

## 2021-09-16 ENCOUNTER — Encounter: Payer: Self-pay | Admitting: Family Medicine

## 2021-09-16 ENCOUNTER — Ambulatory Visit (INDEPENDENT_AMBULATORY_CARE_PROVIDER_SITE_OTHER): Payer: Commercial Managed Care - HMO | Admitting: Family Medicine

## 2021-09-16 ENCOUNTER — Other Ambulatory Visit (HOSPITAL_BASED_OUTPATIENT_CLINIC_OR_DEPARTMENT_OTHER): Payer: Self-pay

## 2021-09-16 VITALS — BP 156/70 | HR 65 | Temp 97.9°F | Ht 64.0 in | Wt 142.0 lb

## 2021-09-16 DIAGNOSIS — G8929 Other chronic pain: Secondary | ICD-10-CM | POA: Diagnosis not present

## 2021-09-16 DIAGNOSIS — K219 Gastro-esophageal reflux disease without esophagitis: Secondary | ICD-10-CM

## 2021-09-16 DIAGNOSIS — M5441 Lumbago with sciatica, right side: Secondary | ICD-10-CM | POA: Diagnosis not present

## 2021-09-16 DIAGNOSIS — Z794 Long term (current) use of insulin: Secondary | ICD-10-CM

## 2021-09-16 DIAGNOSIS — M5442 Lumbago with sciatica, left side: Secondary | ICD-10-CM

## 2021-09-16 DIAGNOSIS — E1122 Type 2 diabetes mellitus with diabetic chronic kidney disease: Secondary | ICD-10-CM | POA: Diagnosis not present

## 2021-09-16 DIAGNOSIS — Z992 Dependence on renal dialysis: Secondary | ICD-10-CM | POA: Diagnosis not present

## 2021-09-16 DIAGNOSIS — N186 End stage renal disease: Secondary | ICD-10-CM | POA: Diagnosis not present

## 2021-09-16 DIAGNOSIS — I152 Hypertension secondary to endocrine disorders: Secondary | ICD-10-CM | POA: Insufficient documentation

## 2021-09-16 DIAGNOSIS — I10 Essential (primary) hypertension: Secondary | ICD-10-CM

## 2021-09-16 MED ORDER — HYDRALAZINE HCL 100 MG PO TABS
100.0000 mg | ORAL_TABLET | Freq: Three times a day (TID) | ORAL | 2 refills | Status: DC
  Filled 2021-09-16: qty 270, 90d supply, fill #0
  Filled 2022-01-22: qty 270, 90d supply, fill #1
  Filled 2022-06-19: qty 270, 90d supply, fill #2

## 2021-09-16 MED ORDER — ISOSORBIDE MONONITRATE ER 60 MG PO TB24: 90.0000 mg | ORAL_TABLET | Freq: Every day | ORAL | 3 refills | Status: DC

## 2021-09-16 MED ORDER — PANTOPRAZOLE SODIUM 40 MG PO TBEC
40.0000 mg | DELAYED_RELEASE_TABLET | Freq: Every day | ORAL | 2 refills | Status: DC
  Filled 2021-09-16: qty 90, 90d supply, fill #0

## 2021-09-16 MED ORDER — DICLOFENAC SODIUM 1 % EX GEL
4.0000 g | Freq: Four times a day (QID) | CUTANEOUS | 2 refills | Status: DC
Start: 2021-09-16 — End: 2022-12-11
  Filled 2021-09-16: qty 100, 7d supply, fill #0

## 2021-09-16 MED ORDER — CARVEDILOL 6.25 MG PO TABS
6.2500 mg | ORAL_TABLET | Freq: Two times a day (BID) | ORAL | 3 refills | Status: DC
  Filled 2021-09-16: qty 60, 30d supply, fill #0
  Filled 2021-10-09: qty 60, 30d supply, fill #1
  Filled 2021-11-11: qty 60, 30d supply, fill #2
  Filled 2022-01-01: qty 60, 30d supply, fill #3

## 2021-09-16 NOTE — Patient Instructions (Addendum)
If you do not hear anything about your referrals in the next 1-2 weeks, call our office and ask for an update.  Keep the diet clean and stay active.  Aim to do some physical exertion for 150 minutes per week. This is typically divided into 5 days per week, 30 minutes per day. The activity should be enough to get your heart rate up. Anything is better than nothing if you have time constraints. A pool may be a good idea.   Check your blood pressures 2-3 times per week, alternating the time of day you check it. If it is high, considering waiting 1-2 minutes and rechecking. If it gets higher, your anxiety is likely creeping up and we should avoid rechecking.   Let us know if you need anything.

## 2021-09-16 NOTE — Progress Notes (Signed)
Chief Complaint  Patient presents with   New Patient (Initial Visit)       New Patient Visit SUBJECTIVE: HPI: Kenneth [name] is an 66 y.o.male who is being seen for establishing care.  The patient was previously seen at [facility]. Here w friend and spouse.  DM II on pump. Sees endo at [facility], requesting to change over to [facility]. Would also like a referral to an eye doc. Last A1c was 5.3 on 07/28/21. He is on Novolog TID w meals also.    Hypertension Patient presents for hypertension follow up. He does not monitor home blood pressures. He is compliant with medications- Hydralazine 100 TID, not taking AM and afternoon on HD days 3x/week. Patient has these side effects of medication: none He is adhering to a healthy diet overall. Exercise: none No CP or SOB.   GERD- taking Protonix 40 mg/d. Reports compliance, no AE's. No unexplained wt loss, fevers, bleeding, nighttime awakenings.   Chronic low back pain-patient has been having progressive chronic low back pain with weakness as well.  He had an MRI of his lumbar and thoracic spine ordered a few months ago with the orthospine team but he never had this done.  He was also to be referred to the neurosurgical team but never saw them.  No bowel or bladder incontinence.  No new weakness since then.  He denies any bruising, swelling, or redness.  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   type 1 DM   ESRD on hemodialysis ([place])    "MWF; [facility]" (03/30/2018)   Gastroesophageal reflux disease 08/18/2016   HCAP (healthcare-associated pneumonia)    Hyperlipidemia    Hypertension    Hypertensive heart disease 09/12/2017   Ischemic rest pain of lower extremity 02/06/2015    Keratoma 02/27/2015   Metatarsal deformity 02/27/2015   NSTEMI (non-ST elevated myocardial infarction) (HCC)    PAF (paroxysmal atrial fibrillation) (HCC)    Pleural effusion    Pronation deformity of both feet 02/27/2015   Past Surgical History:  Procedure Laterality Date   A/V FISTULAGRAM Left 03/06/2021   Procedure: A/V FISTULAGRAM;  Surgeon: Marty Heck, MD;  Location: [place] [facility];  Service: Cardiovascular;  Laterality: Left;   AV FISTULA PLACEMENT Left 06/22/2016   Procedure: ARTERIOVENOUS (AV) FISTULA CREATION LEFT UPPER ARM;  Surgeon: Rosetta Posner, MD;  Location: [place];  Service: Vascular;  Laterality: Left;   BIOPSY  12/19/2020   Procedure: BIOPSY;  Surgeon: Ladene Artist, MD;  Location: [place];  Service: Endoscopy;;   CARDIAC CATHETERIZATION N/A 05/31/2016   Procedure: Left Heart Cath and Coronary Angiography;  Surgeon: Jettie Booze, MD;  Location: [place] [facility];  Service: Cardiovascular;  Laterality: N/A;   CARDIAC CATHETERIZATION N/A 05/31/2016   Procedure: Right Heart Cath;  Surgeon: Jettie Booze, MD;  Location: [place] [facility];  Service: Cardiovascular;  Laterality: N/A;   CARDIAC CATHETERIZATION N/A 05/31/2016   Procedure: IABP Insertion;  Surgeon: Jettie Booze, MD;  Location: [place] [facility];  Service: Cardiovascular;  Laterality: N/A;   COLONOSCOPY WITH PROPOFOL N/A 12/20/2020   Procedure: COLONOSCOPY WITH PROPOFOL;  Surgeon: Harl Bowie  V, MD;  Location: [place] [facility];  Service: Endoscopy;  Laterality: N/A;   CORONARY ARTERY BYPASS GRAFT N/A 06/05/2016   Procedure: CORONARY ARTERY BYPASS GRAFTING (CABG) x3 LIMA to LAD -SVG to OM -SVG to RCA;  Surgeon: Ivin Poot, MD;  Location: [place];  Service: Open Heart Surgery;  Laterality: N/A;   CORONARY STENT INTERVENTION N/A 07/07/2017   Procedure: CORONARY STENT INTERVENTION;  Surgeon: Wellington Hampshire, MD;  Location: [place] [facility];  Service: Cardiovascular;   Laterality: N/A;   CORONARY STENT INTERVENTION N/A 03/30/2018   Procedure: CORONARY STENT INTERVENTION;  Surgeon: Martinique, Peter M, MD;  Location: [place] [facility];  Service: Cardiovascular;  Laterality: N/A;   ESOPHAGOGASTRODUODENOSCOPY (EGD) WITH PROPOFOL N/A 12/19/2020   Procedure: ESOPHAGOGASTRODUODENOSCOPY (EGD) WITH PROPOFOL;  Surgeon: Ladene Artist, MD;  Location: [facility];  Service: Endoscopy;  Laterality: N/A;   GIVENS CAPSULE STUDY N/A 12/20/2020   Procedure: GIVENS CAPSULE STUDY;  Surgeon: Mauri Pole, MD;  Location: [place];  Service: Endoscopy;  Laterality: N/A;   INSERTION OF DIALYSIS CATHETER N/A 06/05/2016   Procedure: INSERTION OF DIALYSIS/trialysis CATHETER;  Surgeon: Ivin Poot, MD;  Location: [place];  Service: Vascular;  Laterality: N/A;   INSERTION OF DIALYSIS CATHETER Right 06/13/2016   Procedure: INSERTION OF DIALYSIS CATHETER RIGHT INTERNAL JUGULAR;  Surgeon: Conrad [place], MD;  Location: [place];  Service: Vascular;  Laterality: Right;   INSERTION OF DIALYSIS CATHETER Right 09/07/2019   Procedure: Insertion Of Dialysis Catheter;  Surgeon: Rosetta Posner, MD;  Location: [facility];  Service: Vascular;  Laterality: Right;   INTRAOPERATIVE TRANSESOPHAGEAL ECHOCARDIOGRAM N/A 06/05/2016   Procedure: INTRAOPERATIVE TRANSESOPHAGEAL ECHOCARDIOGRAM;  Surgeon: Ivin Poot, MD;  Location: [place];  Service: Open Heart Surgery;  Laterality: N/A;   LEFT HEART CATH AND CORS/GRAFTS ANGIOGRAPHY N/A 11/03/2016   Procedure: Left Heart Cath and Cors/Grafts Angiography;  Surgeon: Troy Sine, MD;  Location: [place] [facility];  Service: Cardiovascular;  Laterality: N/A;   LEFT HEART CATH AND CORS/GRAFTS ANGIOGRAPHY N/A 07/07/2017   Procedure: LEFT HEART CATH AND CORS/GRAFTS ANGIOGRAPHY;  Surgeon: Larey Dresser, MD;  Location: [place] [facility];  Service: Cardiovascular;  Laterality: N/A;   LEFT HEART CATH AND CORS/GRAFTS ANGIOGRAPHY N/A 03/30/2018   Procedure: LEFT HEART CATH  AND CORS/GRAFTS ANGIOGRAPHY;  Surgeon: Martinique, Peter M, MD;  Location: [place] [facility];  Service: Cardiovascular;  Laterality: N/A;   LEFT HEART CATH AND CORS/GRAFTS ANGIOGRAPHY N/A 04/17/2019   Procedure: LEFT HEART CATH AND CORS/GRAFTS ANGIOGRAPHY;  Surgeon: Larey Dresser, MD;  Location: [place] [facility];  Service: Cardiovascular;  Laterality: N/A;   PERIPHERAL VASCULAR INTERVENTION Left 03/06/2021   Procedure: PERIPHERAL VASCULAR INTERVENTION;  Surgeon: Marty Heck, MD;  Location: [place] [facility];  Service: Cardiovascular;  Laterality: Left;   POLYPECTOMY  12/20/2020   Procedure: POLYPECTOMY;  Surgeon: Mauri Pole, MD;  Location: [place] [facility];  Service: Endoscopy;;   REVISION OF ARTERIOVENOUS GORETEX GRAFT Left 41/74/0814   Procedure: PLICATION OF ANEURYSM OF ARTERIOVENOUS FISTULA  LEFT ARM;  Surgeon: Rosetta Posner, MD;  Location: [facility];  Service: Vascular;  Laterality: Left;   REVISON OF ARTERIOVENOUS FISTULA Left 09/07/2019   Procedure: REVISON OF LEFT UPPER ARM ARTERIOVENOUS FISTULA;  Surgeon: Rosetta Posner, MD;  Location: [facility];  Service: Vascular;  Laterality: Left;   RIGHT/LEFT HEART CATH AND CORONARY ANGIOGRAPHY N/A 01/03/2021   Procedure: RIGHT/LEFT HEART CATH AND CORONARY ANGIOGRAPHY;  Surgeon:  Larey Dresser, MD;  Location: [place] [facility];  Service: Cardiovascular;  Laterality: N/A;   Family History  Problem Relation Age of Onset   CAD Father    Colon cancer Father    Diabetes Brother    No Known Allergies  Current Outpatient Medications:    acetaminophen (TYLENOL) 500 MG tablet, Take 1,000 mg by mouth every 6 (six) hours as needed for mild pain or headache. , Disp: , Rfl:    apixaban (ELIQUIS) 5 MG TABS tablet, Take 1 tablet (5 mg total) by mouth 2 (two) times daily., Disp: 60 tablet, Rfl: 6   atorvastatin (LIPITOR) 80 MG tablet, Take 1 tablet by mouth once daily (Patient taking differently: Take 80 mg by mouth daily.), Disp: 90 tablet, Rfl: 0    calcium acetate (PHOSLO) 667 MG capsule, Take 667 mg by mouth 3 (three) times daily with meals. , Disp: , Rfl:    diclofenac Sodium (VOLTAREN) 1 % GEL, Apply 4 grams topically 4 (four) times daily., Disp: 100 g, Rfl: 2   ezetimibe (ZETIA) 10 MG tablet, Take 10 mg by mouth daily., Disp: , Rfl:    glucose blood (CONTOUR NEXT TEST) test strip, Test BS 5 times a day, Disp: , Rfl:    Insulin Human (INSULIN PUMP) SOLN, Inject 1 each into the skin continuous. NOVOLOG - 20 units, Disp: , Rfl:    Methoxy PEG-Epoetin Beta (MIRCERA IJ), Mircera, Disp: , Rfl:    NOVOLOG 100 UNIT/ML injection, Inject 15-20 Units into the skin 3 (three) times daily with meals. 15-20 units via pump, Disp: , Rfl:    carvedilol (COREG) 6.25 MG tablet, Take 1 tablet (6.25 mg total) by mouth 2 (two) times daily with a meal. Hold on the mornings of dialysis., Disp: 60 tablet, Rfl: 3   hydrALAZINE (APRESOLINE) 100 MG tablet, Take 1 tablet (100 mg total) by mouth 3 (three) times daily. DO NOT TAKE AM AND NOON DOSES ON HD DAYS, Disp: 270 tablet, Rfl: 2   isosorbide mononitrate (IMDUR) 60 MG 24 hr tablet, Take 1.5 tablets (90 mg total) by mouth daily. Hold on days of dialysis on Mon, Wed, Fri., Disp: 45 tablet, Rfl: 3   pantoprazole (PROTONIX) 40 MG tablet, Take 1 tablet (40 mg total) by mouth daily., Disp: 90 tablet, Rfl: 2  OBJECTIVE: BP (!) 156/70 (BP Location: Right Arm, Cuff Size: Normal)    Pulse 65    Temp 97.9 F (36.6 C) (Oral)    Ht 5\' 4"  (1.626 m)    Wt 142 lb (64.4 kg)    SpO2 97%    BMI 24.37 kg/m  General:  well developed, well nourished, in no apparent distress Skin:  no significant moles, warts, or growths Lungs:  clear to auscultation, breath sounds equal bilaterally, no respiratory distress Cardio:  regular rate and rhythm, no LE edema or bruits Musculoskeletal: Kyphotic spine Neuro:  gait's slow Psych: Normal affect and mood  ASSESSMENT/PLAN: Type 2 diabetes mellitus with chronic kidney disease on chronic  dialysis, with long-term current use of insulin ([place]) - Plan: Ambulatory referral to Endocrinology, Ambulatory referral to Ophthalmology, CANCELED: Ambulatory referral to Ophthalmology  Essential hypertension - Plan: carvedilol (COREG) 6.25 MG tablet  Gastroesophageal reflux disease, unspecified whether esophagitis present - Plan: pantoprazole (PROTONIX) 40 MG tablet  Chronic bilateral low back pain with bilateral sciatica - Plan: Ambulatory referral to Neurosurgery, diclofenac Sodium (VOLTAREN) 1 % GEL  Referral to Endo and ophtho. Appreciate them and [facility] team providing HD. Chronic,  uncontrolled. Cont Hydralazine as previously taking.  Start Coreg 6.25 mg twice daily, hold morning dose on Monday, Wednesday, and Friday, days of dialysis.  Monitor blood pressure at home. Patient should return in 1 month to recheck. Chronic, stable.  Continue Protonix 20 mg daily. Refer to neurosurgery as the Ortho team has originally planned.  They specifically request Dr. Deri Fuelling.  Voltaren gel on Tylenol in the meanwhile.  He has a history of prolonged QT interval so we will hold off on tramadol.  Given his current mental status, I would also prefer to avoid opioids at this time. The patient, his spouse and their friend voiced understanding and agreement to the plan.   [place], DO 09/16/21  4:45 PM

## 2021-09-17 DIAGNOSIS — Z23 Encounter for immunization: Secondary | ICD-10-CM | POA: Diagnosis not present

## 2021-09-17 DIAGNOSIS — Z992 Dependence on renal dialysis: Secondary | ICD-10-CM | POA: Diagnosis not present

## 2021-09-17 DIAGNOSIS — N186 End stage renal disease: Secondary | ICD-10-CM | POA: Diagnosis not present

## 2021-09-17 DIAGNOSIS — E875 Hyperkalemia: Secondary | ICD-10-CM | POA: Diagnosis not present

## 2021-09-17 DIAGNOSIS — D631 Anemia in chronic kidney disease: Secondary | ICD-10-CM | POA: Diagnosis not present

## 2021-09-17 DIAGNOSIS — N2581 Secondary hyperparathyroidism of renal origin: Secondary | ICD-10-CM | POA: Diagnosis not present

## 2021-09-17 DIAGNOSIS — E1122 Type 2 diabetes mellitus with diabetic chronic kidney disease: Secondary | ICD-10-CM | POA: Diagnosis not present

## 2021-09-19 DIAGNOSIS — D631 Anemia in chronic kidney disease: Secondary | ICD-10-CM | POA: Diagnosis not present

## 2021-09-19 DIAGNOSIS — E875 Hyperkalemia: Secondary | ICD-10-CM | POA: Diagnosis not present

## 2021-09-19 DIAGNOSIS — Z23 Encounter for immunization: Secondary | ICD-10-CM | POA: Diagnosis not present

## 2021-09-19 DIAGNOSIS — Z992 Dependence on renal dialysis: Secondary | ICD-10-CM | POA: Diagnosis not present

## 2021-09-19 DIAGNOSIS — N186 End stage renal disease: Secondary | ICD-10-CM | POA: Diagnosis not present

## 2021-09-19 DIAGNOSIS — E1122 Type 2 diabetes mellitus with diabetic chronic kidney disease: Secondary | ICD-10-CM | POA: Diagnosis not present

## 2021-09-19 DIAGNOSIS — N2581 Secondary hyperparathyroidism of renal origin: Secondary | ICD-10-CM | POA: Diagnosis not present

## 2021-09-22 DIAGNOSIS — E1122 Type 2 diabetes mellitus with diabetic chronic kidney disease: Secondary | ICD-10-CM | POA: Diagnosis not present

## 2021-09-22 DIAGNOSIS — E875 Hyperkalemia: Secondary | ICD-10-CM | POA: Diagnosis not present

## 2021-09-22 DIAGNOSIS — N186 End stage renal disease: Secondary | ICD-10-CM | POA: Diagnosis not present

## 2021-09-22 DIAGNOSIS — N2581 Secondary hyperparathyroidism of renal origin: Secondary | ICD-10-CM | POA: Diagnosis not present

## 2021-09-22 DIAGNOSIS — Z992 Dependence on renal dialysis: Secondary | ICD-10-CM | POA: Diagnosis not present

## 2021-09-22 DIAGNOSIS — Z23 Encounter for immunization: Secondary | ICD-10-CM | POA: Diagnosis not present

## 2021-09-22 DIAGNOSIS — D631 Anemia in chronic kidney disease: Secondary | ICD-10-CM | POA: Diagnosis not present

## 2021-09-23 ENCOUNTER — Ambulatory Visit (HOSPITAL_COMMUNITY)
Admission: RE | Admit: 2021-09-23 | Discharge: 2021-09-23 | Disposition: A | Discharge status: Home / Self Care | Payer: Medicare Other | Source: Ambulatory Visit | Attending: Cardiology | Admitting: Cardiology

## 2021-09-23 ENCOUNTER — Other Ambulatory Visit (HOSPITAL_BASED_OUTPATIENT_CLINIC_OR_DEPARTMENT_OTHER): Payer: Self-pay

## 2021-09-23 ENCOUNTER — Other Ambulatory Visit: Payer: Self-pay

## 2021-09-23 ENCOUNTER — Encounter (HOSPITAL_COMMUNITY): Payer: Self-pay | Admitting: Cardiology

## 2021-09-23 VITALS — BP 144/58 | HR 59 | Wt 140.6 lb

## 2021-09-23 DIAGNOSIS — E1140 Type 2 diabetes mellitus with diabetic neuropathy, unspecified: Secondary | ICD-10-CM | POA: Diagnosis not present

## 2021-09-23 DIAGNOSIS — Z951 Presence of aortocoronary bypass graft: Secondary | ICD-10-CM | POA: Insufficient documentation

## 2021-09-23 DIAGNOSIS — G40909 Epilepsy, unspecified, not intractable, without status epilepticus: Secondary | ICD-10-CM | POA: Diagnosis not present

## 2021-09-23 DIAGNOSIS — Z87891 Personal history of nicotine dependence: Secondary | ICD-10-CM | POA: Diagnosis not present

## 2021-09-23 DIAGNOSIS — E875 Hyperkalemia: Secondary | ICD-10-CM | POA: Insufficient documentation

## 2021-09-23 DIAGNOSIS — Z7182 Exercise counseling: Secondary | ICD-10-CM | POA: Diagnosis not present

## 2021-09-23 DIAGNOSIS — Z955 Presence of coronary angioplasty implant and graft: Secondary | ICD-10-CM | POA: Diagnosis not present

## 2021-09-23 DIAGNOSIS — I252 Old myocardial infarction: Secondary | ICD-10-CM | POA: Insufficient documentation

## 2021-09-23 DIAGNOSIS — I480 Paroxysmal atrial fibrillation: Secondary | ICD-10-CM | POA: Diagnosis not present

## 2021-09-23 DIAGNOSIS — G8929 Other chronic pain: Secondary | ICD-10-CM | POA: Insufficient documentation

## 2021-09-23 DIAGNOSIS — E1122 Type 2 diabetes mellitus with diabetic chronic kidney disease: Secondary | ICD-10-CM | POA: Diagnosis not present

## 2021-09-23 DIAGNOSIS — N186 End stage renal disease: Secondary | ICD-10-CM | POA: Diagnosis not present

## 2021-09-23 DIAGNOSIS — Z79899 Other long term (current) drug therapy: Secondary | ICD-10-CM | POA: Insufficient documentation

## 2021-09-23 DIAGNOSIS — E785 Hyperlipidemia, unspecified: Secondary | ICD-10-CM | POA: Diagnosis not present

## 2021-09-23 DIAGNOSIS — M545 Low back pain, unspecified: Secondary | ICD-10-CM | POA: Diagnosis not present

## 2021-09-23 DIAGNOSIS — I2510 Atherosclerotic heart disease of native coronary artery without angina pectoris: Secondary | ICD-10-CM | POA: Insufficient documentation

## 2021-09-23 DIAGNOSIS — M6281 Muscle weakness (generalized): Secondary | ICD-10-CM | POA: Diagnosis not present

## 2021-09-23 DIAGNOSIS — I5032 Chronic diastolic (congestive) heart failure: Secondary | ICD-10-CM

## 2021-09-23 DIAGNOSIS — I132 Hypertensive heart and chronic kidney disease with heart failure and with stage 5 chronic kidney disease, or end stage renal disease: Secondary | ICD-10-CM | POA: Insufficient documentation

## 2021-09-23 DIAGNOSIS — I5042 Chronic combined systolic (congestive) and diastolic (congestive) heart failure: Secondary | ICD-10-CM | POA: Diagnosis not present

## 2021-09-23 DIAGNOSIS — D631 Anemia in chronic kidney disease: Secondary | ICD-10-CM | POA: Diagnosis not present

## 2021-09-23 DIAGNOSIS — R0789 Other chest pain: Secondary | ICD-10-CM | POA: Diagnosis not present

## 2021-09-23 DIAGNOSIS — Z7901 Long term (current) use of anticoagulants: Secondary | ICD-10-CM | POA: Diagnosis not present

## 2021-09-23 MED ORDER — AMLODIPINE BESYLATE 2.5 MG PO TABS: 2.5000 mg | ORAL_TABLET | Freq: Every day | ORAL | 3 refills | Status: DC

## 2021-09-23 MED ORDER — AMLODIPINE BESYLATE 2.5 MG PO TABS
2.5000 mg | ORAL_TABLET | Freq: Every day | ORAL | 3 refills | Status: DC
  Filled 2021-09-23: qty 30, 30d supply, fill #0
  Filled 2021-10-09 – 2021-10-16 (×2): qty 30, 30d supply, fill #1
  Filled 2021-11-11: qty 30, 30d supply, fill #2

## 2021-09-23 NOTE — Progress Notes (Signed)
PCP: Dr. Tasia Catchings [facility]) Cardiology: Dr. Aundra Dubin  66 y.o. 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  He was admitted in 9/17 with NSTEMI.  LHC showed 3VD and he had CABG x 3.  He had CKD stage IV when he was admitted and developed ESRD requiring HD while in the hospital.  He had transient atrial fibrillation post-CABG.  He developed cardiogenic shock requiring milrinone but was able to taper off.  Echo in 9/17 showed EF 35-40%.    Admitted 8/1 through 04/16/2017 with malignant hypertension due to medication noncompliance. Continues on HD.  HF M-W-F. Discharge weight 140 pounds.   Admitted 10/18 with chest pain and pulmonary edema. NSTEMI noted on admit. Echo showed preserved EF but + WMAs. Cath with 95% ostial SVG-OM1 stenosis with TIMI 2 flow in graft.  Patient had DES to SVG-OM1.   Admitted in 12/18 with atypical chest pain, suspect not cardiac.   NSTEMI in 7/19 with severe in-stent restenosis in SVG-OM1, had DES x 2 to native OM1.  Echo showed EF 50%.  Atrial fibrillation was noted this admission.   He has had a chronic pattern of atypical chest pain.  Cardiolite in 3/20 showed EF 54%, small fixed inferior defect with no ischemia.   He was admitted in 8/20 with severe episode of chest pain.  Hs-TnI was mildly elevated without trend.  Cath was done, showing no target for PCI and stable disease. Echo was done showing EF 50-55% with moderate LVH and normal RV.    He was admitted in 4/22 with CHF and chest pain, elevated HS-TnI to 1673.  Volume overloaded treated with daily HD in hospital.  He had RHC/LHC done after he had been dialyzed x 2, showing normal filling pressures and unchanged coronary anatomy.  Echo showed EF 55-60%, RV normal.   Most recent echo 11/22 EF 60-65%, GIIDD, RV normal.    Admitted 11/22 after he became unresponsive at HD w/ ? Seizure like activity. K >7.5 (Lokelma had  been previously stopped). Given Lokelma 10 g + calcium gluconate + insulin. Also w/ fluid overload and given IV Lasix. Nephrology consulted and started urgent HD. HF consulted and recommended dry weight be lowered with NYHA III symptoms. Losartan stopped. 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 Discharged home with [facility] PT/OT, weight 136.8 lbs.  He returns today for followup of CHF and CAD.  He still gets chest pain at times, this seems to be associated with volume overload.  BP has been running high consistently.  He is now off losartan due to hyperkalemia.  He saw pulmonary, PFTs were ordered with concern for COPD given long history of smoking (has quit).  Weight is up 3 lbs since last appointment.   ECG (personally reviewed): NSR, old inferior MI, iRBBB, anterolateral TWIs  Labs (12/18): LDL 64, HDL 31 Labs (7/19): LDL 36  Labs (2/20): hgb 11.3, LDL 59, HDL 28 Labs (2/22): LDL 52, HDL 37 Labs (4/22): hgb 9.9 Labs (7/22): hgb 10.9 Labs (10/22): HDL 46, LDL 52 Labs (11/22): K 4.7, hgb 8.4  PMH: 1. Type II diabetes 2. HTN 3. ESRD 4. CAD: NSTEMI 9/17 with CABG x 3 (LIMA-LAD, SVG-OM, SVG-RCA).   - NSTEMI 10/18 s/p DES to ostial SVG-OM - NSTEMI 7/19: severe in-stent restenosis in SVG-ramus, chronic total occlusion of SVG-RCA.  DES x 2 to native ramus.  - Cardiolite (3/20): EF 54%,  small fixed inferior defect with no ischemia.  - LHC (8/20): 90% pLAD, subtotal occlusion mLAD, LIMA-LAD patent, occluded SVG-high OM1, 40-50% in-stent restenosis in native OM1, 80% ostial small AV LCx (not target for intervention), occluded RCA, occluded SVG-RCA (chronic with left=>right collaterals).  No target for intervention.  - LHC (4/22): 90% pLAD, subtotal occlusion mLAD, LIMA-LAD patent, occluded SVG-high OM1, 50% in-stent restenosis in native OM1, 85% ostial small AV LCx (not target for intervention), occluded RCA, occluded SVG-RCA (chronic with left=>right collaterals).  No target for  intervention.  5. Chronic systolic CHF: Ischemic cardiomyopathy.   - Echo (9/17) with EF 35-40%, grade II diastolic dysfunction.  - Echo (1/18) with EF 60-65%, grade II diastolic dysfunction. - Echo (10/18) with EF 55%, Akinesis of the basal-midinferolateral, inferior, and inferoseptal myocardiam. Akinesis of the basal anteroseptal myocardium.  - Echo (7/19): EF 50%, moderate LVH, mildly decreased RV systolic function.  - Echo (8/20): EF 50-55%, moderate LVH, normal RV - Echo (4/22): EF 55-60%, normal RV - Echo (11/22): EF 60-65%, RV normal. 6. Atrial fibrillation: Paroxysmal.  Only noted post-op CABG.  7. ABIs normal in 12/17. 5/18 ABIs normal.  1/19 ABIs normal. 4/20 ABIs normal.  8. Carotid stenosis: Carotid dopplers (12/18) with < 50% right common carotid stenosis, 6-30% LICA stenosis.  - Carotid dopplers (1/20): < 50% BICA stenosis.  - Carotid dopplers (5/21): Mild bilateral disease.  9. Atrial fibrillation: Paroxysmal.  10. 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 Sciatica 12. Partial seizures 13. ABIs (12/22): normal 14. ?COPD: Prior smoker.  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   FH: No premature CAD.  Review of systems complete and found to be negative unless listed in HPI.    Current Outpatient Medications  Medication Sig Dispense Refill   apixaban (ELIQUIS) 5 MG TABS tablet Take 1 tablet (5 mg total) by mouth 2 (two) times daily. 60 tablet 6   atorvastatin (LIPITOR) 80 MG tablet Take 80 mg by mouth daily.     calcium acetate (PHOSLO) 667 MG capsule Take 667 mg by mouth 3 (three) times daily with meals.      carvedilol (COREG) 6.25 MG tablet Take 1 tablet (6.25 mg total) by mouth 2 (two) times daily with a meal. Hold on the mornings of dialysis. 60 tablet 3   diclofenac Sodium (VOLTAREN) 1 % GEL Apply 4 grams topically 4 (four) times daily. 100 g 2   ezetimibe (ZETIA) 10 MG tablet Take  10 mg by mouth daily.     glucose blood (CONTOUR NEXT TEST) test strip Test BS 5 times a day     hydrALAZINE (APRESOLINE) 100 MG tablet Take 1 tablet (100 mg total) by mouth 3 (three) times daily. DO NOT TAKE AM AND NOON DOSES ON HD DAYS 270 tablet 2   Insulin Human (INSULIN PUMP) SOLN Inject 1 each into the skin continuous. NOVOLOG - 20 units     isosorbide mononitrate (IMDUR) 60 MG 24 hr tablet Take 1.5 tablets (90 mg total) by mouth daily. Hold on days of dialysis on Mon, Wed, Fri. 45 tablet 3   Methoxy PEG-Epoetin Beta (MIRCERA IJ) Mircera     NOVOLOG 100 UNIT/ML injection Inject 15-20 Units into the skin 3 (three) times daily with meals. 15-20 units via pump     pantoprazole (PROTONIX) 40 MG tablet Take 1 tablet (40 mg total) by mouth daily. 90 tablet 2   Sodium  Zirconium Cyclosilicate (LOKELMA PO) Take 1 packet by mouth 4 (four) times a week. Hold on dialysis days     amLODipine (NORVASC) 2.5 MG tablet Take 1 tablet (2.5 mg total) by mouth daily. 30 tablet 3   No current facility-administered medications for this encounter.   BP (!) 144/58    Pulse (!) 59    Wt 63.8 kg (140 lb 9.6 oz)    SpO2 98%    BMI 24.13 kg/m   Wt Readings from Last 3 Encounters:  09/23/21 63.8 kg (140 lb 9.6 oz)  09/16/21 64.4 kg (142 lb)  09/11/21 64.4 kg (142 lb)   General: NAD Neck: No JVD, no thyromegaly or thyroid nodule.  Lungs: Clear to auscultation bilaterally with normal respiratory effort. CV: Nondisplaced PMI.  Heart regular S1/S2, no S3/S4, no murmur.  No peripheral edema.  No carotid bruit.  Normal pedal pulses.  Abdomen: Soft, nontender, no hepatosplenomegaly, no distention.  Skin: Intact without lesions or rashes.  Neurologic: Alert and oriented x 3.  Psych: Normal affect. Extremities: No clubbing or cyanosis.  HEENT: Normal.   Assessment/Plan: 1. CAD: s/p CABG.  NSTEMI 10/18, cath with 95% ostial SVG-high OM1 stenosis with TIMI 2 flow in graft, s/p DES to SVG-OM1 07/07/17. NSTEMI 7/19 with  in-stent restenosis in the SVG-ramus, had DES x 2 to native high OM1.   He has had chronic atypical chest pain episodes, Cardiolite in 3/20 showed no ischemia.  He had repeat cath in 8/20 showing occluded SVG-ramus (new) but continued patency of the native high OM1 s/p stenting.  No target for intervention.  LHC again in 4/22, no change from 8/20.  No intervention. He continues to have episodes of atypical chest pain, suspect nonischemic and likely related to volume overload.  ECG with no change from prior.    - He will continue Eliquis so not on ASA.  - Continue Imdur 90 mg daily.  - Continue atorvastatin 80 mg daily, good lipids in 10/22.    2. Chronic diastolic CHF: EF low in past but improved after CABG. Echo 4/22 with EF 55-60%. Echo 11/22 EF 60-65% NYHA class III symptoms, weight is stable.  I suspect that his exertional dyspnea may be multifactorial => volume overload/diastolic CHF in setting of ESRD, deconditioning, anemia, and possibly COPD. On exam today, he does not look volume overloaded.   - Continue HD for volume control. Would be aggressive with dry weight given symptoms and history CHF.  3. Atrial fibrillation: Paroxysmal, NSR today.  - Continue Eliquis 5 mg bid.  4. HTN: He has not been getting lightheaded with HD, and BP has been running high.  - Continue hydralazine 100 mg tid  - Continue Coreg 6.25 mg bid  - Add amlodipine 2.5 daily.  - Now off losartan with hyperkalemia. - Do not take BP-active meds pre-HD, can take after HD as long as BP not low.  5. ESRD: MWF. Continue Lokelma.  6. Hyperlipidemia: Continue atorvastatin and Zetia. Lipids ok 10/22.  7. Type 2 diabetes: With diabetic neuropathy.  8. Low back pain: Chronic, followed by orthopedist in [place], wants to be seen in [place].  - Has been referred to [facility].  9. Seizure disorder: Needs follow up with Neurology. No further seizures since hospitalization. - Continue Keppra. - No driving. 10. ?COPD:  Long-time smoker but has quit.  - PFTs ordered, to be done in 2/23 . - His family wants a chest CT, will forward request to pulmonary.  11. Anemia: Anemia of  renal disease.  Per nephrology. This likely contributes to his chronic dyspnea.  12. Deconditioning: Encouraged increased activity.   Followup 1 month with NP to reassess BP control.   Loralie Champagne  09/23/2021

## 2021-09-23 NOTE — Patient Instructions (Signed)
Start Amlodipine 2.5 mg Daily  Your physician recommends that you schedule a follow-up appointment in: 1 month with Advanced Practice Provider  If you have any questions or concerns before your next appointment please send [place] a message through [place] or call our office at [phone].    TO LEAVE A MESSAGE FOR THE NURSE SELECT OPTION 2, PLEASE LEAVE A MESSAGE INCLUDING: YOUR NAME DATE OF BIRTH CALL BACK NUMBER REASON FOR CALL**this is important as we prioritize the call backs  YOU WILL RECEIVE A CALL BACK THE SAME DAY AS LONG AS YOU CALL BEFORE 4:00 PM  At the [facility], you and your health needs are our priority. As part of our continuing mission to provide you with exceptional heart care, we have created designated Provider Care Teams. These Care Teams include your primary Cardiologist (physician) and Advanced Practice Providers (APPs- Physician Assistants and Nurse Practitioners) who all work together to provide you with the care you need, when you need it.   You may see any of the following providers on your designated Care Team at your next follow up: Dr Glori Bickers Dr Haynes Kerns, NP Lyda Jester, [place] [facility] [place], [place] Audry Riles, PharmD   Please be sure to bring in all your medications bottles to every appointment.

## 2021-09-24 DIAGNOSIS — D631 Anemia in chronic kidney disease: Secondary | ICD-10-CM | POA: Diagnosis not present

## 2021-09-24 DIAGNOSIS — Z992 Dependence on renal dialysis: Secondary | ICD-10-CM | POA: Diagnosis not present

## 2021-09-24 DIAGNOSIS — E875 Hyperkalemia: Secondary | ICD-10-CM | POA: Diagnosis not present

## 2021-09-24 DIAGNOSIS — Z23 Encounter for immunization: Secondary | ICD-10-CM | POA: Diagnosis not present

## 2021-09-24 DIAGNOSIS — N186 End stage renal disease: Secondary | ICD-10-CM | POA: Diagnosis not present

## 2021-09-24 DIAGNOSIS — E1122 Type 2 diabetes mellitus with diabetic chronic kidney disease: Secondary | ICD-10-CM | POA: Diagnosis not present

## 2021-09-24 DIAGNOSIS — N2581 Secondary hyperparathyroidism of renal origin: Secondary | ICD-10-CM | POA: Diagnosis not present

## 2021-09-25 ENCOUNTER — Other Ambulatory Visit (HOSPITAL_BASED_OUTPATIENT_CLINIC_OR_DEPARTMENT_OTHER): Payer: Self-pay | Admitting: Neurosurgery

## 2021-09-25 DIAGNOSIS — M5416 Radiculopathy, lumbar region: Secondary | ICD-10-CM

## 2021-09-26 DIAGNOSIS — Z23 Encounter for immunization: Secondary | ICD-10-CM | POA: Diagnosis not present

## 2021-09-26 DIAGNOSIS — N2581 Secondary hyperparathyroidism of renal origin: Secondary | ICD-10-CM | POA: Diagnosis not present

## 2021-09-26 DIAGNOSIS — E875 Hyperkalemia: Secondary | ICD-10-CM | POA: Diagnosis not present

## 2021-09-26 DIAGNOSIS — E1122 Type 2 diabetes mellitus with diabetic chronic kidney disease: Secondary | ICD-10-CM | POA: Diagnosis not present

## 2021-09-26 DIAGNOSIS — N186 End stage renal disease: Secondary | ICD-10-CM | POA: Diagnosis not present

## 2021-09-26 DIAGNOSIS — Z992 Dependence on renal dialysis: Secondary | ICD-10-CM | POA: Diagnosis not present

## 2021-09-26 DIAGNOSIS — D631 Anemia in chronic kidney disease: Secondary | ICD-10-CM | POA: Diagnosis not present

## 2021-09-27 ENCOUNTER — Ambulatory Visit (HOSPITAL_BASED_OUTPATIENT_CLINIC_OR_DEPARTMENT_OTHER)
Admission: RE | Admit: 2021-09-27 | Discharge: 2021-09-27 | Disposition: A | Discharge status: Home / Self Care | Payer: Medicare Other | Source: Ambulatory Visit | Attending: Neurosurgery | Admitting: Neurosurgery

## 2021-09-27 ENCOUNTER — Other Ambulatory Visit: Payer: Self-pay

## 2021-09-27 DIAGNOSIS — E785 Hyperlipidemia, unspecified: Secondary | ICD-10-CM | POA: Diagnosis not present

## 2021-09-27 DIAGNOSIS — E1022 Type 1 diabetes mellitus with diabetic chronic kidney disease: Secondary | ICD-10-CM | POA: Diagnosis not present

## 2021-09-27 DIAGNOSIS — I480 Paroxysmal atrial fibrillation: Secondary | ICD-10-CM | POA: Diagnosis not present

## 2021-09-27 DIAGNOSIS — Z8631 Personal history of diabetic foot ulcer: Secondary | ICD-10-CM | POA: Diagnosis not present

## 2021-09-27 DIAGNOSIS — D631 Anemia in chronic kidney disease: Secondary | ICD-10-CM | POA: Diagnosis not present

## 2021-09-27 DIAGNOSIS — Z87891 Personal history of nicotine dependence: Secondary | ICD-10-CM | POA: Diagnosis not present

## 2021-09-27 DIAGNOSIS — K219 Gastro-esophageal reflux disease without esophagitis: Secondary | ICD-10-CM | POA: Diagnosis not present

## 2021-09-27 DIAGNOSIS — Z7901 Long term (current) use of anticoagulants: Secondary | ICD-10-CM | POA: Diagnosis not present

## 2021-09-27 DIAGNOSIS — I2510 Atherosclerotic heart disease of native coronary artery without angina pectoris: Secondary | ICD-10-CM | POA: Diagnosis not present

## 2021-09-27 DIAGNOSIS — M47816 Spondylosis without myelopathy or radiculopathy, lumbar region: Secondary | ICD-10-CM | POA: Diagnosis not present

## 2021-09-27 DIAGNOSIS — I255 Ischemic cardiomyopathy: Secondary | ICD-10-CM | POA: Diagnosis not present

## 2021-09-27 DIAGNOSIS — M545 Low back pain, unspecified: Secondary | ICD-10-CM | POA: Diagnosis not present

## 2021-09-27 DIAGNOSIS — M2578 Osteophyte, vertebrae: Secondary | ICD-10-CM | POA: Diagnosis not present

## 2021-09-27 DIAGNOSIS — I1311 Hypertensive heart and chronic kidney disease without heart failure, with stage 5 chronic kidney disease, or end stage renal disease: Secondary | ICD-10-CM | POA: Diagnosis not present

## 2021-09-27 DIAGNOSIS — I5030 Unspecified diastolic (congestive) heart failure: Secondary | ICD-10-CM | POA: Diagnosis not present

## 2021-09-27 DIAGNOSIS — M5416 Radiculopathy, lumbar region: Secondary | ICD-10-CM | POA: Insufficient documentation

## 2021-09-27 DIAGNOSIS — I70229 Atherosclerosis of native arteries of extremities with rest pain, unspecified extremity: Secondary | ICD-10-CM | POA: Diagnosis not present

## 2021-09-27 DIAGNOSIS — I252 Old myocardial infarction: Secondary | ICD-10-CM | POA: Diagnosis not present

## 2021-09-27 DIAGNOSIS — N186 End stage renal disease: Secondary | ICD-10-CM | POA: Diagnosis not present

## 2021-09-27 DIAGNOSIS — M216X1 Other acquired deformities of right foot: Secondary | ICD-10-CM | POA: Diagnosis not present

## 2021-09-27 DIAGNOSIS — F32A Depression, unspecified: Secondary | ICD-10-CM | POA: Diagnosis not present

## 2021-09-27 DIAGNOSIS — E1052 Type 1 diabetes mellitus with diabetic peripheral angiopathy with gangrene: Secondary | ICD-10-CM | POA: Diagnosis not present

## 2021-09-27 DIAGNOSIS — M216X2 Other acquired deformities of left foot: Secondary | ICD-10-CM | POA: Diagnosis not present

## 2021-09-27 DIAGNOSIS — Z992 Dependence on renal dialysis: Secondary | ICD-10-CM | POA: Diagnosis not present

## 2021-09-27 DIAGNOSIS — Z9181 History of falling: Secondary | ICD-10-CM | POA: Diagnosis not present

## 2021-09-29 DIAGNOSIS — N2581 Secondary hyperparathyroidism of renal origin: Secondary | ICD-10-CM | POA: Diagnosis not present

## 2021-09-29 DIAGNOSIS — Z992 Dependence on renal dialysis: Secondary | ICD-10-CM | POA: Diagnosis not present

## 2021-09-29 DIAGNOSIS — Z23 Encounter for immunization: Secondary | ICD-10-CM | POA: Diagnosis not present

## 2021-09-29 DIAGNOSIS — E875 Hyperkalemia: Secondary | ICD-10-CM | POA: Diagnosis not present

## 2021-09-29 DIAGNOSIS — N186 End stage renal disease: Secondary | ICD-10-CM | POA: Diagnosis not present

## 2021-09-29 DIAGNOSIS — D631 Anemia in chronic kidney disease: Secondary | ICD-10-CM | POA: Diagnosis not present

## 2021-09-29 DIAGNOSIS — E1122 Type 2 diabetes mellitus with diabetic chronic kidney disease: Secondary | ICD-10-CM | POA: Diagnosis not present

## 2021-10-01 DIAGNOSIS — N186 End stage renal disease: Secondary | ICD-10-CM | POA: Diagnosis not present

## 2021-10-01 DIAGNOSIS — N2581 Secondary hyperparathyroidism of renal origin: Secondary | ICD-10-CM | POA: Diagnosis not present

## 2021-10-01 DIAGNOSIS — D631 Anemia in chronic kidney disease: Secondary | ICD-10-CM | POA: Diagnosis not present

## 2021-10-01 DIAGNOSIS — Z23 Encounter for immunization: Secondary | ICD-10-CM | POA: Diagnosis not present

## 2021-10-01 DIAGNOSIS — E1122 Type 2 diabetes mellitus with diabetic chronic kidney disease: Secondary | ICD-10-CM | POA: Diagnosis not present

## 2021-10-01 DIAGNOSIS — E875 Hyperkalemia: Secondary | ICD-10-CM | POA: Diagnosis not present

## 2021-10-01 DIAGNOSIS — Z992 Dependence on renal dialysis: Secondary | ICD-10-CM | POA: Diagnosis not present

## 2021-10-02 DIAGNOSIS — E785 Hyperlipidemia, unspecified: Secondary | ICD-10-CM | POA: Diagnosis not present

## 2021-10-02 DIAGNOSIS — Z992 Dependence on renal dialysis: Secondary | ICD-10-CM | POA: Diagnosis not present

## 2021-10-02 DIAGNOSIS — E1052 Type 1 diabetes mellitus with diabetic peripheral angiopathy with gangrene: Secondary | ICD-10-CM | POA: Diagnosis not present

## 2021-10-02 DIAGNOSIS — I252 Old myocardial infarction: Secondary | ICD-10-CM | POA: Diagnosis not present

## 2021-10-02 DIAGNOSIS — Z87891 Personal history of nicotine dependence: Secondary | ICD-10-CM | POA: Diagnosis not present

## 2021-10-02 DIAGNOSIS — I480 Paroxysmal atrial fibrillation: Secondary | ICD-10-CM | POA: Diagnosis not present

## 2021-10-02 DIAGNOSIS — F32A Depression, unspecified: Secondary | ICD-10-CM | POA: Diagnosis not present

## 2021-10-02 DIAGNOSIS — I5030 Unspecified diastolic (congestive) heart failure: Secondary | ICD-10-CM | POA: Diagnosis not present

## 2021-10-02 DIAGNOSIS — K219 Gastro-esophageal reflux disease without esophagitis: Secondary | ICD-10-CM | POA: Diagnosis not present

## 2021-10-02 DIAGNOSIS — D631 Anemia in chronic kidney disease: Secondary | ICD-10-CM | POA: Diagnosis not present

## 2021-10-02 DIAGNOSIS — E1022 Type 1 diabetes mellitus with diabetic chronic kidney disease: Secondary | ICD-10-CM | POA: Diagnosis not present

## 2021-10-02 DIAGNOSIS — M216X1 Other acquired deformities of right foot: Secondary | ICD-10-CM | POA: Diagnosis not present

## 2021-10-02 DIAGNOSIS — I1311 Hypertensive heart and chronic kidney disease without heart failure, with stage 5 chronic kidney disease, or end stage renal disease: Secondary | ICD-10-CM | POA: Diagnosis not present

## 2021-10-02 DIAGNOSIS — N186 End stage renal disease: Secondary | ICD-10-CM | POA: Diagnosis not present

## 2021-10-02 DIAGNOSIS — Z8631 Personal history of diabetic foot ulcer: Secondary | ICD-10-CM | POA: Diagnosis not present

## 2021-10-02 DIAGNOSIS — Z7901 Long term (current) use of anticoagulants: Secondary | ICD-10-CM | POA: Diagnosis not present

## 2021-10-02 DIAGNOSIS — M216X2 Other acquired deformities of left foot: Secondary | ICD-10-CM | POA: Diagnosis not present

## 2021-10-02 DIAGNOSIS — I255 Ischemic cardiomyopathy: Secondary | ICD-10-CM | POA: Diagnosis not present

## 2021-10-02 DIAGNOSIS — I70229 Atherosclerosis of native arteries of extremities with rest pain, unspecified extremity: Secondary | ICD-10-CM | POA: Diagnosis not present

## 2021-10-02 DIAGNOSIS — I2510 Atherosclerotic heart disease of native coronary artery without angina pectoris: Secondary | ICD-10-CM | POA: Diagnosis not present

## 2021-10-02 DIAGNOSIS — Z9181 History of falling: Secondary | ICD-10-CM | POA: Diagnosis not present

## 2021-10-03 DIAGNOSIS — Z23 Encounter for immunization: Secondary | ICD-10-CM | POA: Diagnosis not present

## 2021-10-03 DIAGNOSIS — D631 Anemia in chronic kidney disease: Secondary | ICD-10-CM | POA: Diagnosis not present

## 2021-10-03 DIAGNOSIS — Z992 Dependence on renal dialysis: Secondary | ICD-10-CM | POA: Diagnosis not present

## 2021-10-03 DIAGNOSIS — E1122 Type 2 diabetes mellitus with diabetic chronic kidney disease: Secondary | ICD-10-CM | POA: Diagnosis not present

## 2021-10-03 DIAGNOSIS — N186 End stage renal disease: Secondary | ICD-10-CM | POA: Diagnosis not present

## 2021-10-03 DIAGNOSIS — N2581 Secondary hyperparathyroidism of renal origin: Secondary | ICD-10-CM | POA: Diagnosis not present

## 2021-10-03 DIAGNOSIS — E875 Hyperkalemia: Secondary | ICD-10-CM | POA: Diagnosis not present

## 2021-10-06 ENCOUNTER — Telehealth: Payer: Self-pay | Admitting: Pulmonary Disease

## 2021-10-06 ENCOUNTER — Telehealth: Payer: Self-pay | Admitting: Internal Medicine

## 2021-10-06 DIAGNOSIS — R0609 Other forms of dyspnea: Secondary | ICD-10-CM

## 2021-10-06 DIAGNOSIS — Z992 Dependence on renal dialysis: Secondary | ICD-10-CM | POA: Diagnosis not present

## 2021-10-06 DIAGNOSIS — Z23 Encounter for immunization: Secondary | ICD-10-CM | POA: Diagnosis not present

## 2021-10-06 DIAGNOSIS — E875 Hyperkalemia: Secondary | ICD-10-CM | POA: Diagnosis not present

## 2021-10-06 DIAGNOSIS — N2581 Secondary hyperparathyroidism of renal origin: Secondary | ICD-10-CM | POA: Diagnosis not present

## 2021-10-06 DIAGNOSIS — E1122 Type 2 diabetes mellitus with diabetic chronic kidney disease: Secondary | ICD-10-CM | POA: Diagnosis not present

## 2021-10-06 DIAGNOSIS — D631 Anemia in chronic kidney disease: Secondary | ICD-10-CM | POA: Diagnosis not present

## 2021-10-06 DIAGNOSIS — N186 End stage renal disease: Secondary | ICD-10-CM | POA: Diagnosis not present

## 2021-10-06 NOTE — Telephone Encounter (Signed)
Pts friend is calling in stating that they are wondering how long it will be before he can be seen as a newpt.  Pts friend is wanting a call back to let her now what they need to do as far as getting the pt scheduled with other providers.

## 2021-10-06 NOTE — Telephone Encounter (Signed)
Called and spoke with [place]. She stated that the patient had been complaining about the cough as well. She is aware of MH's recommendations. Order has been placed for CT at [place].   Nothing further needed at time of call.

## 2021-10-06 NOTE — Telephone Encounter (Signed)
Called and spoke with Bonnita Nasuti, patient's sister in law. She stated that she received a call from the patient's social worker stating that the patient has been coughing more over the past few weeks. She described the cough has non-productive. Denied any wheezing, increased SOB or fevers. She is concerned because he is on dialysis and wonders if he has fluid build up around his lungs. She is check on his weight to see if there is any increase. She wanted to know if a CT scan was ordered at his visit last month. I advised her that only a PFT was ordered.   He is scheduled to f/u on 02/09 with a PFT first, then OV afterwards. She wanted to see if we could move up the appt. I offered to get him scheduled with one of the NPs but she stated that they would prefer to see the doctor.   She wants to know if MH would be willing to order a CT scan to be done before his next appt.   MH, can you please advise? Thanks!

## 2021-10-06 NOTE — Telephone Encounter (Signed)
I would prefer to see the PFT before deciding if CT is needed or not. His CXR in November was clear. Has the family noticed the cough or just the social worker? Does the patient complain of cough? Can order a CT chest without contrast.

## 2021-10-07 DIAGNOSIS — I252 Old myocardial infarction: Secondary | ICD-10-CM | POA: Diagnosis not present

## 2021-10-07 DIAGNOSIS — Z9181 History of falling: Secondary | ICD-10-CM | POA: Diagnosis not present

## 2021-10-07 DIAGNOSIS — I255 Ischemic cardiomyopathy: Secondary | ICD-10-CM | POA: Diagnosis not present

## 2021-10-07 DIAGNOSIS — Z7901 Long term (current) use of anticoagulants: Secondary | ICD-10-CM | POA: Diagnosis not present

## 2021-10-07 DIAGNOSIS — Z992 Dependence on renal dialysis: Secondary | ICD-10-CM | POA: Diagnosis not present

## 2021-10-07 DIAGNOSIS — M216X1 Other acquired deformities of right foot: Secondary | ICD-10-CM | POA: Diagnosis not present

## 2021-10-07 DIAGNOSIS — Z87891 Personal history of nicotine dependence: Secondary | ICD-10-CM | POA: Diagnosis not present

## 2021-10-07 DIAGNOSIS — I70229 Atherosclerosis of native arteries of extremities with rest pain, unspecified extremity: Secondary | ICD-10-CM | POA: Diagnosis not present

## 2021-10-07 DIAGNOSIS — E785 Hyperlipidemia, unspecified: Secondary | ICD-10-CM | POA: Diagnosis not present

## 2021-10-07 DIAGNOSIS — K219 Gastro-esophageal reflux disease without esophagitis: Secondary | ICD-10-CM | POA: Diagnosis not present

## 2021-10-07 DIAGNOSIS — F32A Depression, unspecified: Secondary | ICD-10-CM | POA: Diagnosis not present

## 2021-10-07 DIAGNOSIS — D631 Anemia in chronic kidney disease: Secondary | ICD-10-CM | POA: Diagnosis not present

## 2021-10-07 DIAGNOSIS — I2510 Atherosclerotic heart disease of native coronary artery without angina pectoris: Secondary | ICD-10-CM | POA: Diagnosis not present

## 2021-10-07 DIAGNOSIS — I1311 Hypertensive heart and chronic kidney disease without heart failure, with stage 5 chronic kidney disease, or end stage renal disease: Secondary | ICD-10-CM | POA: Diagnosis not present

## 2021-10-07 DIAGNOSIS — E1022 Type 1 diabetes mellitus with diabetic chronic kidney disease: Secondary | ICD-10-CM | POA: Diagnosis not present

## 2021-10-07 DIAGNOSIS — I5030 Unspecified diastolic (congestive) heart failure: Secondary | ICD-10-CM | POA: Diagnosis not present

## 2021-10-07 DIAGNOSIS — N186 End stage renal disease: Secondary | ICD-10-CM | POA: Diagnosis not present

## 2021-10-07 DIAGNOSIS — I480 Paroxysmal atrial fibrillation: Secondary | ICD-10-CM | POA: Diagnosis not present

## 2021-10-07 DIAGNOSIS — E1052 Type 1 diabetes mellitus with diabetic peripheral angiopathy with gangrene: Secondary | ICD-10-CM | POA: Diagnosis not present

## 2021-10-07 DIAGNOSIS — Z8631 Personal history of diabetic foot ulcer: Secondary | ICD-10-CM | POA: Diagnosis not present

## 2021-10-07 DIAGNOSIS — M216X2 Other acquired deformities of left foot: Secondary | ICD-10-CM | POA: Diagnosis not present

## 2021-10-08 DIAGNOSIS — N2581 Secondary hyperparathyroidism of renal origin: Secondary | ICD-10-CM | POA: Diagnosis not present

## 2021-10-08 DIAGNOSIS — Z23 Encounter for immunization: Secondary | ICD-10-CM | POA: Diagnosis not present

## 2021-10-08 DIAGNOSIS — Z992 Dependence on renal dialysis: Secondary | ICD-10-CM | POA: Diagnosis not present

## 2021-10-08 DIAGNOSIS — E875 Hyperkalemia: Secondary | ICD-10-CM | POA: Diagnosis not present

## 2021-10-08 DIAGNOSIS — N186 End stage renal disease: Secondary | ICD-10-CM | POA: Diagnosis not present

## 2021-10-08 DIAGNOSIS — E1122 Type 2 diabetes mellitus with diabetic chronic kidney disease: Secondary | ICD-10-CM | POA: Diagnosis not present

## 2021-10-08 DIAGNOSIS — D631 Anemia in chronic kidney disease: Secondary | ICD-10-CM | POA: Diagnosis not present

## 2021-10-09 ENCOUNTER — Other Ambulatory Visit (HOSPITAL_COMMUNITY): Payer: Self-pay | Admitting: Family Medicine

## 2021-10-09 ENCOUNTER — Other Ambulatory Visit (HOSPITAL_BASED_OUTPATIENT_CLINIC_OR_DEPARTMENT_OTHER): Payer: Self-pay

## 2021-10-09 ENCOUNTER — Other Ambulatory Visit (HOSPITAL_COMMUNITY): Payer: Self-pay | Admitting: Cardiology

## 2021-10-09 DIAGNOSIS — M48062 Spinal stenosis, lumbar region with neurogenic claudication: Secondary | ICD-10-CM | POA: Diagnosis not present

## 2021-10-10 DIAGNOSIS — N186 End stage renal disease: Secondary | ICD-10-CM | POA: Diagnosis not present

## 2021-10-10 DIAGNOSIS — E1122 Type 2 diabetes mellitus with diabetic chronic kidney disease: Secondary | ICD-10-CM | POA: Diagnosis not present

## 2021-10-10 DIAGNOSIS — E875 Hyperkalemia: Secondary | ICD-10-CM | POA: Diagnosis not present

## 2021-10-10 DIAGNOSIS — D631 Anemia in chronic kidney disease: Secondary | ICD-10-CM | POA: Diagnosis not present

## 2021-10-10 DIAGNOSIS — N2581 Secondary hyperparathyroidism of renal origin: Secondary | ICD-10-CM | POA: Diagnosis not present

## 2021-10-10 DIAGNOSIS — Z23 Encounter for immunization: Secondary | ICD-10-CM | POA: Diagnosis not present

## 2021-10-10 DIAGNOSIS — Z992 Dependence on renal dialysis: Secondary | ICD-10-CM | POA: Diagnosis not present

## 2021-10-13 DIAGNOSIS — N2581 Secondary hyperparathyroidism of renal origin: Secondary | ICD-10-CM | POA: Diagnosis not present

## 2021-10-13 DIAGNOSIS — E1122 Type 2 diabetes mellitus with diabetic chronic kidney disease: Secondary | ICD-10-CM | POA: Diagnosis not present

## 2021-10-13 DIAGNOSIS — N186 End stage renal disease: Secondary | ICD-10-CM | POA: Diagnosis not present

## 2021-10-13 DIAGNOSIS — E875 Hyperkalemia: Secondary | ICD-10-CM | POA: Diagnosis not present

## 2021-10-13 DIAGNOSIS — Z992 Dependence on renal dialysis: Secondary | ICD-10-CM | POA: Diagnosis not present

## 2021-10-13 DIAGNOSIS — D631 Anemia in chronic kidney disease: Secondary | ICD-10-CM | POA: Diagnosis not present

## 2021-10-13 DIAGNOSIS — Z23 Encounter for immunization: Secondary | ICD-10-CM | POA: Diagnosis not present

## 2021-10-14 DIAGNOSIS — N186 End stage renal disease: Secondary | ICD-10-CM | POA: Diagnosis not present

## 2021-10-14 DIAGNOSIS — E1122 Type 2 diabetes mellitus with diabetic chronic kidney disease: Secondary | ICD-10-CM | POA: Diagnosis not present

## 2021-10-14 DIAGNOSIS — Z992 Dependence on renal dialysis: Secondary | ICD-10-CM | POA: Diagnosis not present

## 2021-10-16 ENCOUNTER — Other Ambulatory Visit (HOSPITAL_COMMUNITY): Payer: Self-pay | Admitting: Cardiology

## 2021-10-16 ENCOUNTER — Other Ambulatory Visit (HOSPITAL_BASED_OUTPATIENT_CLINIC_OR_DEPARTMENT_OTHER): Payer: Self-pay

## 2021-10-20 NOTE — Progress Notes (Signed)
PCP: Dr. Tasia Catchings [facility]) Cardiology: Dr. Aundra Dubin  66 y.o. 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  He was admitted in 9/17 with NSTEMI.  LHC showed 3VD and he had CABG x 3.  He had CKD stage IV when he was admitted and developed ESRD requiring HD while in the hospital.  He had transient atrial fibrillation post-CABG.  He developed cardiogenic shock requiring milrinone but was able to taper off.  Echo in 9/17 showed EF 35-40%.    Admitted 8/1 through 04/16/2017 with malignant hypertension due to medication noncompliance. Continues on HD.  HF M-W-F. Discharge weight 140 pounds.   Admitted 10/18 with chest pain and pulmonary edema. NSTEMI noted on admit. Echo showed preserved EF but + WMAs. Cath with 95% ostial SVG-OM1 stenosis with TIMI 2 flow in graft.  Patient had DES to SVG-OM1.   Admitted in 12/18 with atypical chest pain, suspect not cardiac.   NSTEMI in 7/19 with severe in-stent restenosis in SVG-OM1, had DES x 2 to native OM1.  Echo showed EF 50%.  Atrial fibrillation was noted this admission.   He has had a chronic pattern of atypical chest pain.  Cardiolite in 3/20 showed EF 54%, small fixed inferior defect with no ischemia.   He was admitted in 8/20 with severe episode of chest pain.  Hs-TnI was mildly elevated without trend.  Cath was done, showing no target for PCI and stable disease. Echo was done showing EF 50-55% with moderate LVH and normal RV.    He was admitted in 4/22 with CHF and chest pain, elevated HS-TnI to 1673.  Volume overloaded treated with daily HD in hospital.  He had RHC/LHC done after he had been dialyzed x 2, showing normal filling pressures and unchanged coronary anatomy.  Echo showed EF 55-60%, RV normal.   Most recent echo 11/22 EF 60-65%, GIIDD, RV normal.    Admitted 11/22 after he became unresponsive at HD w/ ? Seizure like activity. K >7.5 (Lokelma had  been previously stopped). Given Lokelma 10 g + calcium gluconate + insulin. Also w/ fluid overload and given IV Lasix. Nephrology consulted and started urgent HD. HF consulted and recommended dry weight be lowered with NYHA III symptoms. Losartan stopped. 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 Discharged home with [facility] PT/OT, weight 136.8 lbs.  Today he returns for HF follow up, his friend is serving as Veterinary surgeon. Occasionally has chest pains that seems to be associated with volume overload. Last visit, amlodipine was added for BP control. Home BP log shows BP 130-160s/60-70s. Overall feeling better. He has dyspnea with exertion but this has been stable. Denies abnormal bleeding, palpitations, dizziness, edema, or PND/Orthopnea. Appetite ok. No fever or chills. Weight stable. Taking all medications. He saw pulmonary, PFTs were ordered with concern for COPD given long history of smoking (has quit).   ECG (personally reviewed): none ordered today.  Labs (12/18): LDL 64, HDL 31 Labs (7/19): LDL 36  Labs (2/20): hgb 11.3, LDL 59, HDL 28 Labs (2/22): LDL 52, HDL 37 Labs (4/22): hgb 9.9 Labs (7/22): hgb 10.9 Labs (10/22): HDL 46, LDL 52 Labs (11/22): K 4.7, hgb 8.4  PMH: 1. Type II diabetes 2. HTN 3. ESRD 4. CAD: NSTEMI 9/17 with CABG x 3 (LIMA-LAD, SVG-OM, SVG-RCA).   - NSTEMI 10/18 s/p DES to ostial SVG-OM - NSTEMI 7/19: severe in-stent restenosis in SVG-ramus, chronic total  occlusion of SVG-RCA.  DES x 2 to native ramus.  - Cardiolite (3/20): EF 54%, small fixed inferior defect with no ischemia.  - LHC (8/20): 90% pLAD, subtotal occlusion mLAD, LIMA-LAD patent, occluded SVG-high OM1, 40-50% in-stent restenosis in native OM1, 80% ostial small AV LCx (not target for intervention), occluded RCA, occluded SVG-RCA (chronic with left=>right collaterals).  No target for intervention.  - LHC (4/22): 90% pLAD, subtotal occlusion mLAD, LIMA-LAD patent, occluded SVG-high OM1, 50%  in-stent restenosis in native OM1, 85% ostial small AV LCx (not target for intervention), occluded RCA, occluded SVG-RCA (chronic with left=>right collaterals).  No target for intervention.  5. Chronic systolic CHF: Ischemic cardiomyopathy.   - Echo (9/17) with EF 35-40%, grade II diastolic dysfunction.  - Echo (1/18) with EF 60-65%, grade II diastolic dysfunction. - Echo (10/18) with EF 55%, Akinesis of the basal-midinferolateral, inferior, and inferoseptal myocardiam. Akinesis of the basal anteroseptal myocardium.  - Echo (7/19): EF 50%, moderate LVH, mildly decreased RV systolic function.  - Echo (8/20): EF 50-55%, moderate LVH, normal RV - Echo (4/22): EF 55-60%, normal RV - Echo (11/22): EF 60-65%, RV normal. 6. Atrial fibrillation: Paroxysmal.  Only noted post-op CABG.  7. ABIs normal in 12/17. 5/18 ABIs normal.  1/19 ABIs normal. 4/20 ABIs normal.  8. Carotid stenosis: Carotid dopplers (12/18) with < 50% right common carotid stenosis, 9-24% LICA stenosis.  - Carotid dopplers (1/20): < 50% BICA stenosis.  - Carotid dopplers (5/21): Mild bilateral disease.  9. Atrial fibrillation: Paroxysmal.  10. 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 Sciatica 12. Partial seizures 13. ABIs (12/22): normal 14. ?COPD: Prior smoker.  70. Uveitis  SH: Married, lives in [place], 1 son, prior smoker, no ETOH, speaks only [place].   FH: No premature CAD.  Review of systems complete and found to be negative unless listed in HPI.    Current Outpatient Medications  Medication Sig Dispense Refill   amLODipine (NORVASC) 2.5 MG tablet Take 1 tablet (2.5 mg total) by mouth daily. 30 tablet 3   atorvastatin (LIPITOR) 80 MG tablet Take 1 tablet by mouth once daily 90 tablet 3   calcium acetate (PHOSLO) 667 MG capsule Take 667 mg by mouth 3 (three) times daily with meals.      carvedilol (COREG) 6.25 MG tablet Take 1 tablet (6.25 mg total) by mouth 2 (two) times daily with a  meal. Hold on the mornings of dialysis. 60 tablet 3   diclofenac Sodium (VOLTAREN) 1 % GEL Apply 4 grams topically 4 (four) times daily. 100 g 2   ELIQUIS 5 MG TABS tablet Take 1 tablet by mouth twice daily 60 tablet 11   ezetimibe (ZETIA) 10 MG tablet Take 10 mg by mouth daily.     glucose blood (CONTOUR NEXT TEST) test strip Test BS 5 times a day     hydrALAZINE (APRESOLINE) 100 MG tablet Take 1 tablet (100 mg total) by mouth 3 (three) times daily. DO NOT TAKE AM AND NOON DOSES ON HD DAYS 270 tablet 2   Insulin Human (INSULIN PUMP) SOLN Inject 1 each into the skin continuous. NOVOLOG - 20 units     isosorbide mononitrate (IMDUR) 60 MG 24 hr tablet Take 1.5 tablets (90 mg total) by mouth daily. Hold on days of dialysis on Mon, Wed, Fri. 45 tablet 3   Methoxy PEG-Epoetin Beta (MIRCERA IJ) Mircera     NOVOLOG 100 UNIT/ML injection Inject 15-20 Units into the skin 3 (three) times  daily with meals. 15-20 units via pump     pantoprazole (PROTONIX) 40 MG tablet Take 1 tablet (40 mg total) by mouth daily. 90 tablet 2   Sodium Zirconium Cyclosilicate (LOKELMA PO) Take 1 packet by mouth 4 (four) times a week. Hold on dialysis days     No current facility-administered medications for this encounter.   BP (!) 120/54    Pulse 63    Wt 63.4 kg (139 lb 12.8 oz)    SpO2 97%    BMI 22.56 kg/m   Wt Readings from Last 3 Encounters:  10/21/21 63.4 kg (139 lb 12.8 oz)  10/21/21 62.7 kg (138 lb 4 oz)  09/23/21 63.8 kg (140 lb 9.6 oz)   General:  NAD. No resp difficulty HEENT: Normal Neck: Supple. No JVD. Carotids 2+ bilat; no bruits. No lymphadenopathy or thryomegaly appreciated. Cor: PMI nondisplaced. Regular rate & rhythm. No rubs, gallops or murmurs. Lungs: Clear Abdomen: Soft, nontender, nondistended. No hepatosplenomegaly. No bruits or masses. Good bowel sounds. Extremities: No cyanosis, clubbing, rash, edema Neuro: Alert & oriented x 3, cranial nerves grossly intact. Moves all 4 extremities w/o  difficulty. Affect pleasant.  Assessment/Plan: 1. CAD: s/p CABG.  NSTEMI 10/18, cath with 95% ostial SVG-high OM1 stenosis with TIMI 2 flow in graft, s/p DES to SVG-OM1 07/07/17. NSTEMI 7/19 with in-stent restenosis in the SVG-ramus, had DES x 2 to native high OM1.   He has had chronic atypical chest pain episodes, Cardiolite in 3/20 showed no ischemia.  He had repeat cath in 8/20 showing occluded SVG-ramus (new) but continued patency of the native high OM1 s/p stenting.  No target for intervention.  LHC again in 4/22, no change from 8/20.  No intervention. Episodes of chest pain are less frequent now. - He will continue Eliquis so not on ASA.  - Continue Imdur 90 mg daily.  - Continue atorvastatin 80 mg daily, good lipids in 10/22.    2. Chronic diastolic CHF: EF low in past but improved after CABG. Echo 4/22 with EF 55-60%. Echo 11/22 EF 60-65% NYHA class II- early III symptoms, weight is stable.  I suspect that his exertional dyspnea may be multifactorial => volume overload/diastolic CHF in setting of ESRD, deconditioning, anemia, and possibly COPD. On exam today, he does not look volume overloaded.   - Continue HD for volume control. Would be aggressive with dry weight given symptoms and history CHF.  3. Atrial fibrillation: Paroxysmal. Regular on exam today. - Continue Eliquis 5 mg bid.  4. HTN: He has not been getting lightheaded with HD and BP better controlled today. - Continue hydralazine 100 mg tid  - Continue Coreg 6.25 mg bid  - Continue amlodipine 2.5 daily.  - Now off losartan with hyperkalemia. - Do not take BP-active meds pre-HD, can take after HD as long as BP not low.  5. ESRD: MWF. Continue Lokelma.  6. Hyperlipidemia: Continue atorvastatin and Zetia. Lipids ok 10/22.  7. Type 2 diabetes: With diabetic neuropathy.  8. Low back pain: Chronic, followed by orthopedist in [place], wants to be seen in [place].  - Has been referred to [facility].  9. Seizure disorder: Needs  follow up with Neurology. No further seizures since hospitalization. - Continue Keppra. - No driving. 10. ?COPD: Long-time smoker but has quit.  - PFTs ordered, to be done in 2/23. - His family wants a chest CT, this was completed today. 11. Anemia: Anemia of renal disease.  Per nephrology. This likely contributes to his chronic  dyspnea.  12. Deconditioning: Encouraged increased activity.   Followup with Dr. Aundra Dubin in 3 months.  [place] FNP 10/21/2021

## 2021-10-21 ENCOUNTER — Other Ambulatory Visit: Payer: Self-pay | Admitting: Family Medicine

## 2021-10-21 ENCOUNTER — Encounter: Payer: Self-pay | Admitting: Family Medicine

## 2021-10-21 ENCOUNTER — Encounter (HOSPITAL_COMMUNITY): Payer: Self-pay

## 2021-10-21 ENCOUNTER — Ambulatory Visit (HOSPITAL_BASED_OUTPATIENT_CLINIC_OR_DEPARTMENT_OTHER)
Admission: RE | Admit: 2021-10-21 | Discharge: 2021-10-21 | Disposition: A | Discharge status: Home / Self Care | Payer: Medicare Other | Source: Ambulatory Visit | Attending: Pulmonary Disease | Admitting: Pulmonary Disease

## 2021-10-21 ENCOUNTER — Ambulatory Visit (INDEPENDENT_AMBULATORY_CARE_PROVIDER_SITE_OTHER): Payer: Medicare Other | Admitting: Family Medicine

## 2021-10-21 ENCOUNTER — Other Ambulatory Visit: Payer: Self-pay

## 2021-10-21 ENCOUNTER — Telehealth: Payer: Self-pay

## 2021-10-21 VITALS — BP 120/54 | HR 63 | Wt 139.8 lb

## 2021-10-21 VITALS — BP 122/61 | HR 63 | Temp 98.1°F | Ht 66.0 in | Wt 138.2 lb

## 2021-10-21 DIAGNOSIS — R2681 Unsteadiness on feet: Secondary | ICD-10-CM

## 2021-10-21 DIAGNOSIS — Z794 Long term (current) use of insulin: Secondary | ICD-10-CM

## 2021-10-21 DIAGNOSIS — G40909 Epilepsy, unspecified, not intractable, without status epilepticus: Secondary | ICD-10-CM

## 2021-10-21 DIAGNOSIS — I480 Paroxysmal atrial fibrillation: Secondary | ICD-10-CM

## 2021-10-21 DIAGNOSIS — E118 Type 2 diabetes mellitus with unspecified complications: Secondary | ICD-10-CM

## 2021-10-21 DIAGNOSIS — G8929 Other chronic pain: Secondary | ICD-10-CM

## 2021-10-21 DIAGNOSIS — R0609 Other forms of dyspnea: Secondary | ICD-10-CM | POA: Insufficient documentation

## 2021-10-21 DIAGNOSIS — R0602 Shortness of breath: Secondary | ICD-10-CM

## 2021-10-21 DIAGNOSIS — E1122 Type 2 diabetes mellitus with diabetic chronic kidney disease: Secondary | ICD-10-CM

## 2021-10-21 DIAGNOSIS — I5032 Chronic diastolic (congestive) heart failure: Secondary | ICD-10-CM | POA: Diagnosis not present

## 2021-10-21 DIAGNOSIS — K8020 Calculus of gallbladder without cholecystitis without obstruction: Secondary | ICD-10-CM | POA: Diagnosis not present

## 2021-10-21 DIAGNOSIS — I10 Essential (primary) hypertension: Secondary | ICD-10-CM | POA: Diagnosis not present

## 2021-10-21 DIAGNOSIS — N186 End stage renal disease: Secondary | ICD-10-CM

## 2021-10-21 DIAGNOSIS — I2510 Atherosclerotic heart disease of native coronary artery without angina pectoris: Secondary | ICD-10-CM

## 2021-10-21 DIAGNOSIS — R5381 Other malaise: Secondary | ICD-10-CM

## 2021-10-21 DIAGNOSIS — D631 Anemia in chronic kidney disease: Secondary | ICD-10-CM

## 2021-10-21 DIAGNOSIS — M545 Low back pain, unspecified: Secondary | ICD-10-CM

## 2021-10-21 DIAGNOSIS — Z992 Dependence on renal dialysis: Secondary | ICD-10-CM

## 2021-10-21 DIAGNOSIS — E785 Hyperlipidemia, unspecified: Secondary | ICD-10-CM

## 2021-10-21 NOTE — Progress Notes (Signed)
Chief Complaint  Patient presents with   Follow-up    Subjective Kenneth [name] is a 66 y.o. male who presents for hypertension follow up. Here w wife who helps interpret ([place]) though pt does speak fluent [place].  He does monitor home blood pressures. Blood pressures ranging from 120-190's/70-80's on average. He is compliant with medications-Norvasc 2.5 mg daily, Coreg 6.25 mg twice daily holding on mornings of dialysis, hydralazine 100 mg 3 times daily holding on the mornings of dialysis. Patient has these side effects of medication: none He is sometimes adhering to a healthy diet overall. Current exercise: walking No Cp or SOB.    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   type 1 DM   ESRD on hemodialysis ([place])    "MWF; [facility]" (03/30/2018)   Gastroesophageal reflux disease 08/18/2016   HCAP (healthcare-associated pneumonia)    Hyperlipidemia    Hypertension    Hypertensive heart disease 09/12/2017   Ischemic rest pain of lower extremity 02/06/2015   Keratoma 02/27/2015   Metatarsal deformity 02/27/2015   NSTEMI (non-ST elevated myocardial infarction) (HCC)    PAF (paroxysmal atrial fibrillation) (HCC)    Pleural effusion    Pronation deformity of both feet 02/27/2015    Exam BP 122/61    Pulse 63    Temp 98.1 F (36.7 C) (Oral)    Ht 5\' 6"  (1.676 m)    Wt 138 lb 4 oz (62.7 kg)    SpO2 93%    BMI 22.31 kg/m  General:  well developed, well nourished, in no apparent distress Heart: RRR, no bruits, no LE edema Lungs: clear to auscultation, no accessory muscle use Psych: well oriented with normal range of affect and appropriate judgment/insight  Essential hypertension  Chronic, I think  stable. Cont Coreg 6.25 mg bid, Norvasc 2.5 mg/d, Hydralazine 100 mg TID skipping morning doses on days of dialysis.  He will monitor blood pressure at home but I think his monitor may need to be calibrated.  He will bring in his monitor in 2 weeks for nurse visit and compare to our monitor here. Counseled on diet and exercise. F/u in 2 weeks for nurse visit. The patient and his spouse voiced understanding and agreement to the plan.  Ava, DO 10/21/21  10:42 AM

## 2021-10-21 NOTE — Telephone Encounter (Signed)
Informed Kenneth [name] that prescription is ready for pickup. Also they would like a referral to a podiatrist due to having foot problems/diabetic

## 2021-10-21 NOTE — Patient Instructions (Signed)
It was great to see you today! No medication changes are needed at this time.   Your physician recommends that you schedule a follow-up appointment in: 3 months with Dr McLean  Do the following things EVERYDAY: Weigh yourself in the morning before breakfast. Write it down and keep it in a log. Take your medicines as prescribed Eat low salt foods--Limit salt (sodium) to 2000 mg per day.  Stay as active as you can everyday Limit all fluids for the day to less than 2 liters  At the [facility], you and your health needs are our priority. As part of our continuing mission to provide you with exceptional heart care, we have created designated Provider Care Teams. These Care Teams include your primary Cardiologist (physician) and Advanced Practice Providers (APPs- Physician Assistants and Nurse Practitioners) who all work together to provide you with the care you need, when you need it.   You may see any of the following providers on your designated Care Team at your next follow up: Dr Daniel Bensimhon Dr Dalton McLean Amy Clegg, NP Brittainy Simmons, PA Jessica Milford,NP Lindsay Finch, PA Lauren Kemp, PharmD   Please be sure to bring in all your medications bottles to every appointment.    If you have any questions or concerns before your next appointment please send us a message through mychart or call our office at [phone].    TO LEAVE A MESSAGE FOR THE NURSE SELECT OPTION 2, PLEASE LEAVE A MESSAGE INCLUDING: YOUR NAME DATE OF BIRTH CALL BACK NUMBER REASON FOR CALL**this is important as we prioritize the call backs  YOU WILL RECEIVE A CALL BACK THE SAME DAY AS LONG AS YOU CALL BEFORE 4:00 PM   

## 2021-10-21 NOTE — Patient Instructions (Addendum)
Keep the diet clean and stay active.  Bring the blood pressure monitor to your nurse visit.   When you check your BP, make sure you have been doing something calm/relaxing 5 minutes prior to checking. Both feet should be flat on the floor and you should be sitting. Use your left arm and make sure it is in a relaxed position (on a table), and that the cuff is at the approximate level/height of your heart.  Let us know if you need anything.

## 2021-10-21 NOTE — Telephone Encounter (Signed)
Printed order/put at front desk for [facility] left detailed message.

## 2021-10-21 NOTE — Telephone Encounter (Signed)
Referral done

## 2021-10-21 NOTE — Telephone Encounter (Signed)
Mrs. Maudie Mercury called on behalf of pt in follow up to his visit this morning with Dr. Nani Ravens stating pt meant to ask for rollator.  States it is hard for pt to walk, thus the need for the rollator.  They are requesting it come from [facility].  Melonie [place] can be reached at [phone].

## 2021-10-22 NOTE — Progress Notes (Signed)
CT chest shows emphysema that is mild, otherwise reassuring and normal.

## 2021-10-23 ENCOUNTER — Other Ambulatory Visit (HOSPITAL_BASED_OUTPATIENT_CLINIC_OR_DEPARTMENT_OTHER): Payer: Self-pay

## 2021-10-23 ENCOUNTER — Encounter: Payer: Self-pay | Admitting: Pulmonary Disease

## 2021-10-23 ENCOUNTER — Other Ambulatory Visit: Payer: Self-pay

## 2021-10-23 ENCOUNTER — Ambulatory Visit (INDEPENDENT_AMBULATORY_CARE_PROVIDER_SITE_OTHER): Payer: Medicare Other | Admitting: Pulmonary Disease

## 2021-10-23 VITALS — BP 128/60 | HR 60 | Ht 66.0 in | Wt 138.4 lb

## 2021-10-23 DIAGNOSIS — K219 Gastro-esophageal reflux disease without esophagitis: Secondary | ICD-10-CM | POA: Diagnosis not present

## 2021-10-23 DIAGNOSIS — R053 Chronic cough: Secondary | ICD-10-CM

## 2021-10-23 DIAGNOSIS — R0609 Other forms of dyspnea: Secondary | ICD-10-CM

## 2021-10-23 LAB — PULMONARY FUNCTION TEST
DL/VA % pred: 53 %
DL/VA: 2.28 ml/min/mmHg/L
DLCO cor % pred: 44 %
DLCO cor: 11.29 ml/min/mmHg
DLCO unc % pred: 44 %
DLCO unc: 11.29 ml/min/mmHg
FEF 25-75 Post: 2.03 L/sec
FEF 25-75 Pre: 2.12 L/sec
FEF2575-%Change-Post: -3 %
FEF2575-%Pred-Post: 88 %
FEF2575-%Pred-Pre: 91 %
FEV1-%Change-Post: 1 %
FEV1-%Pred-Post: 93 %
FEV1-%Pred-Pre: 91 %
FEV1-Post: 2.54 L
FEV1-Pre: 2.5 L
FEV1FVC-%Change-Post: 0 %
FEV1FVC-%Pred-Pre: 97 %
FEV6-%Change-Post: 3 %
FEV6-Post: 3.3 L
FEV6-Pre: 3.19 L
FEV6FVC-%Change-Post: 0 %
FVC-%Change-Post: 1 %
FVC-%Pred-Post: 95 %
FVC-%Pred-Pre: 94 %
FVC-Post: 3.31 L
FVC-Pre: 3.25 L
Post FEV1/FVC ratio: 77 %
Post FEV6/FVC ratio: 100 %
Pre FEV1/FVC ratio: 77 %
Pre FEV6/FVC Ratio: 99 %
RV % pred: 114 %
RV: 2.35 L
TLC % pred: 98 %
TLC: 5.91 L

## 2021-10-23 MED ORDER — STIOLTO RESPIMAT 2.5-2.5 MCG/ACT IN AERS
2.0000 | INHALATION_SPRAY | Freq: Every day | RESPIRATORY_TRACT | 3 refills | Status: DC
  Filled 2021-10-23: qty 4, 30d supply, fill #0

## 2021-10-23 MED ORDER — PANTOPRAZOLE SODIUM 40 MG PO TBEC
40.0000 mg | DELAYED_RELEASE_TABLET | Freq: Two times a day (BID) | ORAL | 1 refills | Status: DC
Start: 2021-10-23 — End: 2022-07-10
  Filled 2021-10-23: qty 180, 90d supply, fill #0
  Filled 2022-03-31: qty 180, 90d supply, fill #1

## 2021-10-23 NOTE — Patient Instructions (Addendum)
Nice to see you again  Use Stiolto 2 puff once a day to see if helps with cough and shortness of breath  Increase pantoprazole (Protonix) to 1 pill twice a day to see of helps with the cough  The breathing tests are essentially normal - the lungs do not seem to be a major contributor to the shortness of breath  The CT scan showed mild emphysema, the lungs seem largely healthy  Return to clinic in 3 months or sooner as needed

## 2021-10-23 NOTE — Progress Notes (Signed)
PFT done today. 

## 2021-10-23 NOTE — Progress Notes (Signed)
@Patient  ID: Kenneth [name], male    DOB: 1956-04-04, 66 y.o.   MRN: [id number]  Chief Complaint  Patient presents with   Follow-up    F/U after CT scan and PFT    Referring provider: [name], MD  HPI:   66 y.o. man whom we are seeing in follow-up for evaluation of dyspnea exertion.  Most recent cardiology note x2 reviewed.  Overall, symptoms unchanged.  Still dyspneic.  On further characterization feels very weak.  Has cough mainly at night in the morning.  Unchanged.  Review revealed recent CT chest that on my interpretation reveals clear lungs with very mild emphysematous changes primarily in the apices.  PFTs performed today that are normal with exception of severely reduced DLCO that is underestimated given use normal hemoglobin when most recent hemoglobin was in the eights.  Likely corrects to nearly normal.  HPI at initial visit: Patient reports several year history of dyspnea exertion.  Worsened over time.  Not present on flat surfaces or relatively short distances.  Worse on inclines or stairs.  No time of day when things are better or worse.  No position where things are better or worse.  No seasonal environmental factors to get in the family seems better or worse.  No other relieving or exacerbating factors.  States he used inhaler for years ago but did not seem to help.  Notably, onset of symptoms correlate with start of hemodialysis.  Reviewed recent chest x-ray 07/2021 to monitor rotation reveals enlarged cardiac silhouette, otherwise clear lungs.  Reviewed most recent echocardiogram 07/2021 that demonstrates grade 2 diastolic function, dilated left atrium, normal RA, RV size and function, normal estimated RVSP.  Reported dry weight is 69 kg's.  Weight today 64 kg's.  PMH: ESRD on HD Monday Wednesday Friday, hypertension, hyperlipidemia, atrial fibrillation, diabetes, CAD Surgical history: AV fistula placement, CABG, Family history: Father with CAD, colon cancer, mother  with diabetes Social history: Former smoker, 50+ pack year history, quit in 2014, lives in [place] / Pulmonary Flowsheets:   ACT:  No flowsheet data found.  MMRC: mMRC Dyspnea Scale mMRC Score  10/23/2021 0  09/11/2021 1    [place]worth:  No flowsheet data found.  Tests:   FENO:  Lab Results  Component Value Date   NITRICOXIDE TEST WILL BE CREDITED 06/09/2016    PFT: PFT Results Latest Ref Rng & Units 10/23/2021  FVC-Pre L 3.25  FVC-Predicted Pre % 94  FVC-Post L 3.31  FVC-Predicted Post % 95  Pre FEV1/FVC % % 77  Post FEV1/FCV % % 77  FEV1-Pre L 2.50  FEV1-Predicted Pre % 91  FEV1-Post L 2.54  DLCO uncorrected ml/min/mmHg 11.29  DLCO UNC% % 44  DLCO corrected ml/min/mmHg 11.29  DLCO COR %Predicted % 44  DLVA Predicted % 53  TLC L 5.91  TLC % Predicted % 98  RV % Predicted % 114  Personally reviewed and interpreted as normal spirometry, no bronchodilator spots, lung volumes within normal limits, DLCO severely reduced but likely corrects to near normal when corrected for hemoglobin as he has history of anemia most recent hemoglobin 8  WALK:  No flowsheet data found.  Imaging: Personally reviewed and as per EMR discussion of this note CT Chest Wo Contrast  Result Date: 10/22/2021 CLINICAL DATA:  Chronic dyspnea. EXAM: CT CHEST WITHOUT CONTRAST TECHNIQUE: Multidetector CT imaging of the chest was performed following the standard protocol without IV contrast. RADIATION DOSE REDUCTION: This exam was performed according  to the departmental dose-optimization program which includes automated exposure control, adjustment of the mA and/or kV according to patient size and/or use of iterative reconstruction technique. COMPARISON:  August 27, 2019. FINDINGS: Cardiovascular: Status post coronary bypass graft. Atherosclerosis of thoracic aorta is noted without aneurysm formation. Normal cardiac size. No pericardial effusion. Mediastinum/Nodes: No enlarged mediastinal or  axillary lymph nodes. Thyroid gland, trachea, and esophagus demonstrate no significant findings. Lungs/Pleura: No pneumothorax or pleural effusion is noted. Mild emphysematous disease is noted. Biapical scarring is noted. No acute pulmonary disease is noted. Upper Abdomen: Cholelithiasis. Musculoskeletal: No chest wall mass or suspicious bone lesions identified. IMPRESSION: No acute abnormality seen in the chest. Status post coronary bypass graft. Cholelithiasis. Aortic Atherosclerosis (ICD10-I70.0) and Emphysema (ICD10-J43.9). Electronically Signed   By: Marijo Conception M.D.   On: 10/22/2021 08:27   MR LUMBAR SPINE WO CONTRAST  Result Date: 09/29/2021 CLINICAL DATA:  Chronic low back pain extending into both legs. EXAM: MRI LUMBAR SPINE WITHOUT CONTRAST TECHNIQUE: Multiplanar, multisequence MR imaging of the lumbar spine was performed. No intravenous contrast was administered. COMPARISON:  07/17/2021 FINDINGS: Segmentation:  Standard. Alignment:  Physiologic. Vertebrae: No acute fracture, evidence of discitis, or aggressive bone lesion. Conus medullaris and cauda equina: Conus extends to the T12-L1 level. Conus and cauda equina appear normal. Paraspinal and other soft tissues: No acute paraspinal abnormality. Disc levels: Disc spaces: Severe degenerative disease with disc height loss at T12-L1, L1-2 and L2-3 with reactive endplate marrow changes. Disc desiccation at disc desiccation at L3-4, L4-5 and L5-S1 with minimal disc height loss. T12-L1: Broad-based disc bulge flattening the ventral thecal sac. Mild right foraminal stenosis. No left foraminal stenosis. Mild spinal stenosis. L1-L2: Broad-based disc osteophyte complex. Moderate-severe spinal stenosis. Mild right foraminal stenosis. No left foraminal stenosis. L2-L3: Broad-based disc osteophyte complex. Mild bilateral facet arthropathy. Moderate-severe spinal stenosis. Moderate bilateral foraminal stenosis. L3-L4: Broad-based disc osteophyte complex with a  focal right paracentral component impressing upon the thecal sac. Mild bilateral facet arthropathy. Severe spinal stenosis. Moderate bilateral foraminal stenosis. L4-L5: No significant disc bulge. No neural foraminal stenosis. No central canal stenosis. Broad-based disc osteophyte complex. Mild bilateral facet arthropathy with ligamentum flavum infolding. Severe spinal stenosis. No significant foraminal stenosis. L5-S1: No significant disc bulge. No neural foraminal stenosis. No central canal stenosis. Right paracentral disc osteophyte complex. Right subarticular recess stenosis. No left foraminal stenosis. Mild right foraminal stenosis. IMPRESSION: 1. Severe diffuse lumbar spine spondylosis as described above. 2.  No acute osseous injury of the lumbar spine. Electronically Signed   By: Kathreen Devoid M.D.   On: 09/29/2021 07:52    Lab Results: Personally reviewed CBC    Component Value Date/Time   WBC 5.0 07/30/2021 0048   RBC 2.82 (L) 07/30/2021 0048   HGB 8.4 (L) 07/30/2021 0048   HCT 27.1 (L) 07/30/2021 0048   PLT 173 07/30/2021 0048   MCV 96.1 07/30/2021 0048   MCH 29.8 07/30/2021 0048   MCHC 31.0 07/30/2021 0048   RDW 18.6 (H) 07/30/2021 0048   LYMPHSABS 1.0 07/28/2021 0724   MONOABS 0.6 07/28/2021 0724   EOSABS 0.1 07/28/2021 0724   BASOSABS 0.1 07/28/2021 0724    BMET    Component Value Date/Time   NA 136 07/30/2021 0048   K 4.7 07/30/2021 0048   CL 97 (L) 07/30/2021 0048   CO2 28 07/30/2021 0048   GLUCOSE 159 (H) 07/30/2021 0048   BUN 34 (H) 07/30/2021 0048   CREATININE 7.61 (H) 07/30/2021 0048   CALCIUM  8.3 (L) 07/30/2021 0048   GFRNONAA 7 (L) 07/30/2021 0048   GFRAA 8 (L) 12/12/2019 1010    BNP    Component Value Date/Time   BNP 2,160.6 (H) 07/28/2021 1944    ProBNP No results found for: PROBNP  Specialty Problems       Pulmonary Problems   Acute hypoxemic respiratory failure (HCC)   Hemoptysis   Acute respiratory failure (HCC)    No Known  Allergies  Immunization History  Administered Date(s) Administered   Hepatitis B, adult 12/11/2016, 01/08/2017, 02/08/2017, 06/11/2017   Hepb-cpg 07/31/2020, 08/28/2020, 10/02/2020, 01/29/2021   Influenza, Seasonal, Injecte, Preservative Fre 05/29/2017   Influenza,inj,Quad PF,6+ Mos 06/14/2019, 06/14/2019, 06/14/2020, 06/11/2021   Influenza,inj,quad, With Preservative 06/08/2018   PFIZER(Purple Top)SARS-COV-2 Vaccination 11/16/2019, 12/12/2019, 05/31/2020   Pneumococcal Conjugate-13 12/03/2017, 08/16/2019   Pneumococcal Polysaccharide-23 08/05/2016, 12/03/2017    Past Medical History:  Diagnosis Date   CAD (coronary artery disease) 10/30/2016   NSTEMI 9/17 with CABG x 3 (LIMA-LAD, SVG-OM, SVG-RCA).   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   type 1 DM   ESRD on hemodialysis ([place])    "MWF; [facility]" (03/30/2018)   Gastroesophageal reflux disease 08/18/2016   HCAP (healthcare-associated pneumonia)    Hyperlipidemia    Hypertension    Hypertensive heart disease 09/12/2017   Ischemic rest pain of lower extremity 02/06/2015   Keratoma 02/27/2015   Metatarsal deformity 02/27/2015   NSTEMI (non-ST elevated myocardial infarction) (HCC)    PAF (paroxysmal atrial fibrillation) (HCC)    Pleural effusion    Pronation deformity of both feet 02/27/2015    Tobacco History: Social History   Tobacco Use  Smoking Status Former   Packs/day: 1.50   Years: 35.00   Pack years: 52.50   Types: Cigarettes   Quit date: 2014   Years since quitting: 9.1  Smokeless Tobacco Never  Tobacco Comments   "quit e-cigs ~ 2014"   Counseling given: Not Answered Tobacco comments: "quit e-cigs ~ 2014"   Continue to not smoke  Outpatient Encounter Medications as of 10/23/2021  Medication Sig   amLODipine (NORVASC) 2.5 MG tablet Take 1 tablet (2.5 mg total) by  mouth daily.   atorvastatin (LIPITOR) 80 MG tablet Take 1 tablet by mouth once daily   calcium acetate (PHOSLO) 667 MG capsule Take 667 mg by mouth 3 (three) times daily with meals.    carvedilol (COREG) 6.25 MG tablet Take 1 tablet (6.25 mg total) by mouth 2 (two) times daily with a meal. Hold on the mornings of dialysis.   diclofenac Sodium (VOLTAREN) 1 % GEL Apply 4 grams topically 4 (four) times daily.   ELIQUIS 5 MG TABS tablet Take 1 tablet by mouth twice daily   ezetimibe (ZETIA) 10 MG tablet Take 10 mg by mouth daily.   glucose blood (CONTOUR NEXT TEST) test strip Test BS 5 times a day   hydrALAZINE (APRESOLINE) 100 MG tablet Take 1 tablet (100 mg total) by mouth 3 (three) times daily. DO NOT TAKE AM AND NOON DOSES ON HD DAYS   Insulin Human (INSULIN PUMP) SOLN Inject 1 each into the skin continuous. NOVOLOG - 20 units   isosorbide mononitrate (IMDUR) 60 MG 24 hr tablet Take 1.5 tablets (90 mg total) by mouth daily. Hold on days of dialysis on Mon, Wed, Fri.   Methoxy  PEG-Epoetin Beta (MIRCERA IJ) Mircera   NOVOLOG 100 UNIT/ML injection Inject 15-20 Units into the skin 3 (three) times daily with meals. 15-20 units via pump   Sodium Zirconium Cyclosilicate (LOKELMA PO) Take 1 packet by mouth 4 (four) times a week. Hold on dialysis days   Tiotropium Bromide-Olodaterol (STIOLTO RESPIMAT) 2.5-2.5 MCG/ACT AERS Inhale 2 puffs into the lungs daily.   [DISCONTINUED] pantoprazole (PROTONIX) 40 MG tablet Take 1 tablet (40 mg total) by mouth daily.   pantoprazole (PROTONIX) 40 MG tablet Take 1 tablet (40 mg total) by mouth 2 (two) times daily.   No facility-administered encounter medications on file as of 10/23/2021.     Review of Systems  Review of Systems  N/a   Physical Exam  BP 128/60    Pulse 60    Ht 5\' 6"  (1.676 m)    Wt 138 lb 6.4 oz (62.8 kg)    SpO2 99% Comment: on RA   BMI 22.34 kg/m   Wt Readings from Last 5 Encounters:  10/23/21 138 lb 6.4 oz (62.8 kg)  10/21/21 139 lb 12.8  oz (63.4 kg)  10/21/21 138 lb 4 oz (62.7 kg)  09/23/21 140 lb 9.6 oz (63.8 kg)  09/16/21 142 lb (64.4 kg)    BMI Readings from Last 5 Encounters:  10/23/21 22.34 kg/m  10/21/21 22.56 kg/m  10/21/21 22.31 kg/m  09/23/21 24.13 kg/m  09/16/21 24.37 kg/m     Physical Exam General: Well-appearing, no acute distress Eyes: EOMI, icterus Neck: Supple, no JVP Pulmonary: Clear, no crackles, no work of breathing Cardiovascular: regular rate and rhythm, no murmur MSK: No synovitis, no joint effusion Neuro: Normal gait, no weakness Psych: Normal mood, full affect   Assessment & Plan:   Dyspnea on exertion: Likely multifactorial related to relative deconditioning since starting dialysis, intermittent volume overload between dialysis sessions with proclivity to fluid overload given diastolic function left atrial dilation, stable chronic anemia developed in the setting of his end-stage renal disease.  CT chest largely clear with moderate eczema, does not account for symptoms.  PFTs normal.  Lungs unlikely contributing, if at all very small contributor.  Trial of Stiolto given emphysema on CT scan.  Chronic cough: Likely chronic bronchitis in setting of history of cigarette smoking.  Difficult to treat.  Present 10+ years.  Not optimistic we can improve.  Stiolto as above.  Increase PPI to twice daily.   Return in about 3 months (around 01/20/2022).   Lanier Clam, MD 10/23/2021

## 2021-10-27 NOTE — Addendum Note (Signed)
Addended byLarey Days on: 10/27/2021 12:32 PM   Modules accepted: Orders

## 2021-10-28 ENCOUNTER — Other Ambulatory Visit (HOSPITAL_BASED_OUTPATIENT_CLINIC_OR_DEPARTMENT_OTHER): Payer: Self-pay

## 2021-10-28 ENCOUNTER — Encounter: Payer: Self-pay | Admitting: Internal Medicine

## 2021-10-28 ENCOUNTER — Ambulatory Visit (INDEPENDENT_AMBULATORY_CARE_PROVIDER_SITE_OTHER): Payer: Medicare Other | Admitting: Internal Medicine

## 2021-10-28 VITALS — BP 134/72 | HR 68 | Ht 66.0 in | Wt 138.2 lb

## 2021-10-28 DIAGNOSIS — E1122 Type 2 diabetes mellitus with diabetic chronic kidney disease: Secondary | ICD-10-CM

## 2021-10-28 DIAGNOSIS — E1165 Type 2 diabetes mellitus with hyperglycemia: Secondary | ICD-10-CM

## 2021-10-28 DIAGNOSIS — Z992 Dependence on renal dialysis: Secondary | ICD-10-CM

## 2021-10-28 DIAGNOSIS — E1142 Type 2 diabetes mellitus with diabetic polyneuropathy: Secondary | ICD-10-CM | POA: Diagnosis not present

## 2021-10-28 DIAGNOSIS — N186 End stage renal disease: Secondary | ICD-10-CM

## 2021-10-28 DIAGNOSIS — Z794 Long term (current) use of insulin: Secondary | ICD-10-CM

## 2021-10-28 LAB — POCT GLUCOSE (DEVICE FOR HOME USE): Glucose Fasting, POC: 122 mg/dL — AB (ref 70–99)

## 2021-10-28 LAB — POCT GLYCOSYLATED HEMOGLOBIN (HGB A1C): Hemoglobin A1C: 7.1 % — AB (ref 4.0–5.6)

## 2021-10-28 MED ORDER — INSULIN LISPRO 100 UNIT/ML IJ SOLN
INTRAMUSCULAR | 6 refills | Status: DC
  Filled 2021-10-28: qty 20, 67d supply, fill #0

## 2021-10-28 MED ORDER — FREESTYLE LIBRE 2 SENSOR MISC: 1.0000 | 3 refills | Status: AC

## 2021-10-28 NOTE — Progress Notes (Signed)
Name: Kenneth [name]  MRN/ DOB: [id number], 09/06/1956   Age/ Sex: 66 y.o., male    PCP: Shelda Pal, DO   Reason for Endocrinology Evaluation: Type 2 Diabetes Mellitus     Date of Initial Endocrinology Visit: 10/28/2021     PATIENT IDENTIFIER: [name] is a 66 y.o. male with a past medical history of T2DM, CAD, CHF , HTN and ESRD on HD since 2017. The patient presented for initial endocrinology clinic visit on 10/28/2021 for consultative assistance with his diabetes management.    HPI: Kenneth [name] transitioned care from Dr. Hartford Poli . He is accompanied by his wife and interpreter    Diagnosed with DM years ago  > 20 yrs ago . Islet cell Ab negative 2021 Currently checking blood sugars multiple  x / day,  through freestyle libre   Hypoglycemia episodes : yes                Symptoms: shaking                  Frequency: 1/ week Hemoglobin A1c has ranged from 5.2% in 2022, peaking at 8.1% in 2021.   In terms of diet, the patient eats 2 meals a day. Snacks occasionally     Nephrology : Dr. Moshe Cipro . Pt on HD  Monday, Wednesday and Friday  Pulmonary : Dr. Silas Flood  10/2021 for dyspnea  Denies vomiting or diarrhea recently but has chronic nausea    This patient with type 2 diabetes is treated with Medtronic  (insulin pump). During the visit the pump basal and bolus doses were reviewed including carb/insulin rations and supplemental doses. The clinical list was updated. The glucose meter download was reviewed in detail to determine if the current pump settings are providing the best glycemic control without excessive hypoglycemia.  Pump and meter download:    Pump   Medtronic  Settings   Insulin type   Novolog    Basal rate       0000 0.500  u/h    0600 0.550 u/h    2200 0.500 u/h       I:C ratio       0000 1:12                  Sensitivity       0000  60      Goal       0000  100-120            Type & Model of Pump: medtronic  Insulin Type:  Currently using novolog .   PUMP STATISTICS: Average BG: 186 +/- 87 BG Readings: 3.7 / day Average Daily Carbs (g): 99 +/- 58 Average Total Daily Insulin: 24.5  Average Daily Basal: 12.8 (52 %) Average Daily Bolus: 11.7 (48 %)     CONTINUOUS GLUCOSE MONITORING RECORD INTERPRETATION    Dates of Recording: 2/1-2/14/2023  Sensor description:freestyle libre   Results statistics:   CGM use % of time 87  Average and SD 186/43.5  Time in range    56    %  % Time Above 180 23  % Time above 250 20  % Time Below target 1    Glycemic patterns summary: BG's optimal at night with hyperglycemia noted postprandial   Hyperglycemic episodes  postprandial   Hypoglycemic episodes occurred after a bolus   Overnight periods: optimal    HOME DIABETES REGIMEN: Novolog      Statin: yes  ACE-I/ARB:  no      DIABETIC COMPLICATIONS: Microvascular complications:  ESRD on HD , neuropathy Denies: retinopathy  Last eye exam: Completed   Macrovascular complications:  CAD ( S/P CABG 2017) Denies: PVD, CVA   PAST HISTORY: Past Medical History:  Past Medical History:  Diagnosis Date   CAD (coronary artery disease) 10/30/2016   NSTEMI 9/17 with CABG x 3 (LIMA-LAD, SVG-OM, SVG-RCA).   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   type 1 DM   ESRD on hemodialysis ([place])    "MWF; [facility]" (03/30/2018)   Gastroesophageal reflux disease 08/18/2016   HCAP (healthcare-associated pneumonia)    Hyperlipidemia    Hypertension    Hypertensive heart disease 09/12/2017   Ischemic rest pain of lower extremity 02/06/2015   Keratoma 02/27/2015   Metatarsal deformity 02/27/2015   NSTEMI (non-ST elevated myocardial infarction) (HCC)    PAF (paroxysmal atrial fibrillation) (HCC)    Pleural effusion    Pronation deformity of both feet 02/27/2015    Past Surgical History:  Past Surgical History:  Procedure Laterality Date   A/V FISTULAGRAM Left 03/06/2021   Procedure: A/V FISTULAGRAM;  Surgeon: Marty Heck, MD;  Location: [facility];  Service: Cardiovascular;  Laterality: Left;   AV FISTULA PLACEMENT Left 06/22/2016   Procedure: ARTERIOVENOUS (AV) FISTULA CREATION LEFT UPPER ARM;  Surgeon: Rosetta Posner, MD;  Location: [place];  Service: Vascular;  Laterality: Left;   BIOPSY  12/19/2020   Procedure: BIOPSY;  Surgeon: Ladene Artist, MD;  Location: [place];  Service: Endoscopy;;   CARDIAC CATHETERIZATION N/A 05/31/2016   Procedure: Left Heart Cath and Coronary Angiography;  Surgeon: Jettie Booze, MD;  Location: [facility];  Service: Cardiovascular;  Laterality: N/A;   CARDIAC CATHETERIZATION N/A 05/31/2016   Procedure: Right Heart Cath;  Surgeon: Jettie Booze, MD;  Location: [facility] [facility];  Service: Cardiovascular;  Laterality: N/A;   CARDIAC CATHETERIZATION N/A 05/31/2016   Procedure: IABP Insertion;  Surgeon: Jettie Booze, MD;  Location: [facility];  Service: Cardiovascular;  Laterality: N/A;   COLONOSCOPY WITH PROPOFOL N/A 12/20/2020   Procedure: COLONOSCOPY WITH PROPOFOL;  Surgeon: Mauri Pole, MD;  Location: [facility];  Service: Endoscopy;  Laterality: N/A;   CORONARY ARTERY BYPASS GRAFT N/A 06/05/2016   Procedure: CORONARY ARTERY BYPASS GRAFTING (CABG) x3 LIMA to LAD -SVG to OM -SVG to RCA;  Surgeon: Ivin Poot, MD;  Location: [facility];  Service: Open Heart Surgery;  Laterality: N/A;   CORONARY STENT INTERVENTION N/A 07/07/2017   Procedure: CORONARY STENT INTERVENTION;  Surgeon: Wellington Hampshire, MD;  Location: [place] [facility];  Service: Cardiovascular;  Laterality: N/A;   CORONARY STENT INTERVENTION N/A 03/30/2018   Procedure: CORONARY STENT INTERVENTION;  Surgeon: Martinique, Peter M, MD;  Location: [facility];  Service: Cardiovascular;  Laterality: N/A;    ESOPHAGOGASTRODUODENOSCOPY (EGD) WITH PROPOFOL N/A 12/19/2020   Procedure: ESOPHAGOGASTRODUODENOSCOPY (EGD) WITH PROPOFOL;  Surgeon: Ladene Artist, MD;  Location: [facility];  Service: Endoscopy;  Laterality: N/A;   GIVENS CAPSULE STUDY N/A 12/20/2020   Procedure: GIVENS CAPSULE STUDY;  Surgeon: Mauri Pole, MD;  Location: [facility];  Service: Endoscopy;  Laterality: N/A;   INSERTION OF DIALYSIS CATHETER N/A 06/05/2016   Procedure: INSERTION OF DIALYSIS/trialysis CATHETER;  Surgeon: Ivin Poot, MD;  Location: [facility];  Service: Vascular;  Laterality: N/A;   INSERTION OF DIALYSIS CATHETER Right 06/13/2016   Procedure: INSERTION OF DIALYSIS CATHETER RIGHT INTERNAL JUGULAR;  Surgeon: Conrad [place], MD;  Location: [place];  Service: Vascular;  Laterality: Right;   INSERTION OF DIALYSIS CATHETER Right 09/07/2019   Procedure: Insertion Of Dialysis Catheter;  Surgeon: Rosetta Posner, MD;  Location: [facility];  Service: Vascular;  Laterality: Right;   INTRAOPERATIVE TRANSESOPHAGEAL ECHOCARDIOGRAM N/A 06/05/2016   Procedure: INTRAOPERATIVE TRANSESOPHAGEAL ECHOCARDIOGRAM;  Surgeon: Ivin Poot, MD;  Location: [place];  Service: Open Heart Surgery;  Laterality: N/A;   LEFT HEART CATH AND CORS/GRAFTS ANGIOGRAPHY N/A 11/03/2016   Procedure: Left Heart Cath and Cors/Grafts Angiography;  Surgeon: Troy Sine, MD;  Location: [place] [facility];  Service: Cardiovascular;  Laterality: N/A;   LEFT HEART CATH AND CORS/GRAFTS ANGIOGRAPHY N/A 07/07/2017   Procedure: LEFT HEART CATH AND CORS/GRAFTS ANGIOGRAPHY;  Surgeon: Larey Dresser, MD;  Location: [place] [facility];  Service: Cardiovascular;  Laterality: N/A;   LEFT HEART CATH AND CORS/GRAFTS ANGIOGRAPHY N/A 03/30/2018   Procedure: LEFT HEART CATH AND CORS/GRAFTS ANGIOGRAPHY;  Surgeon: Martinique, Peter M, MD;  Location: [facility] [facility];  Service: Cardiovascular;  Laterality: N/A;   LEFT HEART CATH AND CORS/GRAFTS ANGIOGRAPHY N/A 04/17/2019   Procedure: LEFT  HEART CATH AND CORS/GRAFTS ANGIOGRAPHY;  Surgeon: Larey Dresser, MD;  Location: [place] [facility];  Service: Cardiovascular;  Laterality: N/A;   PERIPHERAL VASCULAR INTERVENTION Left 03/06/2021   Procedure: PERIPHERAL VASCULAR INTERVENTION;  Surgeon: Marty Heck, MD;  Location: [place] [facility];  Service: Cardiovascular;  Laterality: Left;   POLYPECTOMY  12/20/2020   Procedure: POLYPECTOMY;  Surgeon: Mauri Pole, MD;  Location: [facility] [place];  Service: Endoscopy;;   REVISION OF ARTERIOVENOUS GORETEX GRAFT Left 42/68/3419   Procedure: PLICATION OF ANEURYSM OF ARTERIOVENOUS FISTULA  LEFT ARM;  Surgeon: Rosetta Posner, MD;  Location: [place];  Service: Vascular;  Laterality: Left;   REVISON OF ARTERIOVENOUS FISTULA Left 09/07/2019   Procedure: REVISON OF LEFT UPPER ARM ARTERIOVENOUS FISTULA;  Surgeon: Rosetta Posner, MD;  Location: [place];  Service: Vascular;  Laterality: Left;   RIGHT/LEFT HEART CATH AND CORONARY ANGIOGRAPHY N/A 01/03/2021   Procedure: RIGHT/LEFT HEART CATH AND CORONARY ANGIOGRAPHY;  Surgeon: Larey Dresser, MD;  Location: [place] [facility];  Service: Cardiovascular;  Laterality: N/A;    Social History:  reports that he quit smoking about 9 years ago. His smoking use included cigarettes. He has a 52.50 pack-year smoking history. He has never used smokeless tobacco. He reports that he does not drink alcohol and does not use drugs. Family History:  Family History  Problem Relation Age of Onset   CAD Father    Colon cancer Father    Diabetes Brother      HOME MEDICATIONS: Allergies as of 10/28/2021   No Known Allergies      Medication List        Accurate as of October 28, 2021  3:03 PM. If you have any questions, ask your nurse or doctor.          STOP taking these medications    NovoLOG 100 UNIT/ML injection Generic drug: insulin aspart Stopped by: Dorita Sciara, MD       TAKE these medications    amLODipine 2.5 MG tablet Commonly  known as: NORVASC Take 1 tablet (2.5 mg total) by  mouth daily.   atorvastatin 80 MG tablet Commonly known as: LIPITOR Take 1 tablet by mouth once daily   calcium acetate 667 MG capsule Commonly known as: PHOSLO Take 667 mg by mouth 3 (three) times daily with meals.   carvedilol 6.25 MG tablet Commonly known as: COREG Take 1 tablet (6.25 mg total) by mouth 2 (two) times daily with a meal. Hold on the mornings of dialysis.   Contour Next Test test strip Generic drug: glucose blood Test BS 5 times a day   diclofenac Sodium 1 % Gel Commonly known as: Voltaren Apply 4 grams topically 4 (four) times daily.   Eliquis 5 MG Tabs tablet Generic drug: apixaban Take 1 tablet by mouth twice daily   ezetimibe 10 MG tablet Commonly known as: ZETIA Take 10 mg by mouth daily.   FreeStyle Libre 2 Sensor Misc 1 Device by Does not apply route every 14 (fourteen) days. Started by: Dorita Sciara, MD   HumaLOG 100 UNIT/ML injection Generic drug: insulin lispro Inject under the skin daily as directed. Maximum daily dose of 30 units. Started by: Dorita Sciara, MD   hydrALAZINE 100 MG tablet Commonly known as: APRESOLINE Take 1 tablet (100 mg total) by mouth 3 (three) times daily. DO NOT TAKE AM AND NOON DOSES ON HD DAYS   insulin pump Soln Inject 1 each into the skin continuous. NOVOLOG - 20 units   isosorbide mononitrate 60 MG 24 hr tablet Commonly known as: IMDUR Take 1.5 tablets (90 mg total) by mouth daily. Hold on days of dialysis on Mon, Wed, Fri.   LOKELMA PO Take 1 packet by mouth 4 (four) times a week. Hold on dialysis days   MIRCERA IJ Mircera   pantoprazole 40 MG tablet Commonly known as: PROTONIX Take 1 tablet (40 mg total) by mouth 2 (two) times daily.   Stiolto Respimat 2.5-2.5 MCG/ACT Aers Generic drug: Tiotropium Bromide-Olodaterol Inhale 2 puffs into the lungs daily.         ALLERGIES: No Known Allergies   REVIEW OF SYSTEMS: A  comprehensive ROS was conducted with the patient and is negative except as per HPI     OBJECTIVE:   VITAL SIGNS: BP 134/72 (BP Location: Left Arm, Patient Position: Sitting, Cuff Size: Small)    Pulse 68    Ht 5\' 6"  (1.676 m)    Wt 138 lb 3.2 oz (62.7 kg)    SpO2 97%    BMI 22.31 kg/m    PHYSICAL EXAM:  General: Pt appears well and is in NAD  Neck: General: Supple without adenopathy or carotid bruits. Thyroid: Thyroid size normal.  No goiter or nodules appreciated. No thyroid bruit.  Lungs: Clear with good BS bilat with no rales, rhonchi, or wheezes  Heart: RRR with normal S1 and S2 and no gallops; no murmurs; no rub  Abdomen: Normoactive bowel sounds, soft, nontender, without masses or organomegaly palpable  Extremities:  Lower extremities - No pretibial edema. No lesions.  Neuro: MS is good with appropriate affect, pt is alert and Ox3    DM foot exam: 10/28/2021  The skin of the feet is intact without sores or ulcerations. The pedal pulses are 2+ on right and 2+ on left. The sensation is absent to a screening 5.07, 10 gram monofilament bilaterally    DATA REVIEWED:  Lab Results  Component Value Date   HGBA1C 7.1 (A) 10/28/2021   HGBA1C 5.3 07/28/2021   HGBA1C 5.9 (H) 12/16/2020   Lab Results  Component Value Date   LDLCALC 48 07/10/2021   CREATININE 7.61 (H) 07/30/2021   No results found for: [facility]  Lab Results  Component Value Date   CHOL 100 07/10/2021   HDL 46 07/10/2021   LDLCALC 48 07/10/2021   TRIG 31 07/10/2021   CHOLHDL 2.2 07/10/2021        ASSESSMENT / PLAN / RECOMMENDATIONS:   1) Type 2 Diabetes Mellitus, Poorly controlled, With Neuropathic, ESRD on HD and macrovascular  complications - Most recent A1c of 5.2 %. Goal A1c < 7.0 %.     - A1c skewed due to ESRD  - Pt with language barriers  - Currently on medtronic. It appears he guesstimates CHO intake. Today he entered 20 grams TWO after after the meal ( ate two slices of bread and a  quarter of an apple , he entered 20 grams )  - Pt had a BG reading of 60 mg/dL, this was corrected with juice, with repeat BG 122 mg/dL  - NO changes to his pump setting, I am going to refer him to our CDE for proper education on bolusing, carb counting and discuss options of other pumps with IQ technology   - He received a letter from insurance indicating [employer] is not covered - Freestyle libre prescription faxed to [employer]. Family advised to contact [facility] to request change of providers so I can receive requests to review pump supplies   MEDICATIONS: Humalog     EDUCATION / INSTRUCTIONS: BG monitoring instructions: Patient is instructed to check his blood sugars 3 times a day, before meals . Call [facility] if: BG persistently < 70  I reviewed the Rule of 15 for the treatment of hypoglycemia in detail with the patient. Literature supplied.   2) Diabetic complications:  Eye: Does not have known diabetic retinopathy.  Neuro/ Feet: Does  have known diabetic peripheral neuropathy. Renal: Patient does  have known baseline CKD. He is  on an ACEI/ARB at present.    Signed electronically by: Mack Guise, MD  [facility] End[facility]  [facility] [address], [place], [place] [address] Phone: [phone] FAX: [phone]   CC: Shelda Pal, [place] [address] [place] [address] Phone: [phone]  Fax: [phone]    Return to Endocrinology clinic as below: Future Appointments  Date Time Provider [place]  10/30/2021 10:45 AM Wallene Huh, DPM [facility]  11/04/2021 10:15 AM [facility] NURSE [facility]  01/20/2022 10:45 AM Hunsucker, Bonna Gains, MD [facility] None  01/27/2022 10:40 AM Larey Dresser, MD [facility] None  01/29/2022 10:50 AM Kiondra Caicedo, Melanie Crazier, MD [facility] None

## 2021-10-30 ENCOUNTER — Telehealth: Payer: Self-pay | Admitting: Internal Medicine

## 2021-10-30 ENCOUNTER — Ambulatory Visit: Payer: Medicare Other

## 2021-10-30 ENCOUNTER — Telehealth: Payer: Self-pay

## 2021-10-30 ENCOUNTER — Ambulatory Visit (INDEPENDENT_AMBULATORY_CARE_PROVIDER_SITE_OTHER): Payer: Medicare Other | Admitting: Podiatry

## 2021-10-30 ENCOUNTER — Other Ambulatory Visit: Payer: Self-pay

## 2021-10-30 DIAGNOSIS — M2042 Other hammer toe(s) (acquired), left foot: Secondary | ICD-10-CM

## 2021-10-30 DIAGNOSIS — B351 Tinea unguium: Secondary | ICD-10-CM

## 2021-10-30 DIAGNOSIS — M2041 Other hammer toe(s) (acquired), right foot: Secondary | ICD-10-CM

## 2021-10-30 DIAGNOSIS — M79675 Pain in left toe(s): Secondary | ICD-10-CM | POA: Diagnosis not present

## 2021-10-30 DIAGNOSIS — M79674 Pain in right toe(s): Secondary | ICD-10-CM

## 2021-10-30 DIAGNOSIS — E1140 Type 2 diabetes mellitus with diabetic neuropathy, unspecified: Secondary | ICD-10-CM

## 2021-10-30 DIAGNOSIS — L97411 Non-pressure chronic ulcer of right heel and midfoot limited to breakdown of skin: Secondary | ICD-10-CM

## 2021-10-30 DIAGNOSIS — E1149 Type 2 diabetes mellitus with other diabetic neurological complication: Secondary | ICD-10-CM

## 2021-10-30 DIAGNOSIS — E1040 Type 1 diabetes mellitus with diabetic neuropathy, unspecified: Secondary | ICD-10-CM

## 2021-10-30 DIAGNOSIS — L89610 Pressure ulcer of right heel, unstageable: Secondary | ICD-10-CM

## 2021-10-30 DIAGNOSIS — E10621 Type 1 diabetes mellitus with foot ulcer: Secondary | ICD-10-CM | POA: Diagnosis not present

## 2021-10-30 NOTE — Telephone Encounter (Signed)
Pts spouse came in and dropped off [place] [employer] [place] [employer] to be completed by the provider.  Upon completion pt would like to have it faxed to the company at [phone].  Form placed in the providers folder for completion.

## 2021-10-30 NOTE — Telephone Encounter (Signed)
Casts sent to central fabrication - on HOLD for authorization

## 2021-10-30 NOTE — Progress Notes (Signed)
SITUATION Reason for Consult: Evaluation for Prefabricated Diabetic Shoes and Bilateral Custom Diabetic Inserts. Patient / Caregiver Report: Patient would like well fitting shoes  OBJECTIVE DATA: Patient History / Diagnosis:    ICD-10-CM   1. Diabetic neuropathy, type I diabetes mellitus ([place])  E10.40     2. Acquired hammer toe of right foot  M20.41     3. Acquired hammer toe of left foot  M20.42     4. Unstageable pressure ulcer of right heel ([facility])  L89.610       Current or Previous Devices:   Received pair last year, says they are too heavy  In-Person Foot Examination: Ulcers & [place]:   Ulcer of right foot  Toe / Foot Deformities:   - hammertoes   Shoe Size: 7.5XW  ORTHOTIC RECOMMENDATION Recommended Devices: - 1x pair prefabricated PDAC approved diabetic shoes: Patient selects [id number] - 3x pair custom-to-patient vacuum formed diabetic insoles.   GOALS OF SHOES AND INSOLES - Reduce shear and pressure - Reduce / Prevent callus formation - Reduce / Prevent ulceration - Protect the fragile healing compromised diabetic foot.  Patient would benefit from diabetic shoes and inserts as patient has diabetes mellitus and the patient has one or more of the following conditions: - History of previous foot ulceration. - History of pre-ulcerative callus - Peripheral neuropathy with evidence of callus formation - Foot deformity - Poor circulation  ACTIONS PERFORMED Patient was casted for insoles via crush box and measured for shoes via brannock device. Procedure was explained and patient tolerated procedure well. All questions were answered and concerns addressed.  PLAN Patient is to ensure treating physician receives and completes diabetic paperwork. Casts and shoe order are to be held until paperwork is received. Once received patient is to be scheduled for fitting in four weeks.

## 2021-10-30 NOTE — Telephone Encounter (Signed)
Form has been completed and faxed. Patient has been notified

## 2021-10-31 NOTE — Progress Notes (Signed)
Subjective:   Patient ID: Kenneth [name], male   DOB: 66 y.o.   MRN: [id number]   HPI Patient presents from caregiver who speaks English and is interpreter with history of ulceration of the right foot with nail disease and long-term diabetes on dialysis   ROS      Objective:  Physical Exam  Neurological sensation found to be diminished both sharp dull or vibratory along with mild diminishment of DP PT pulses but intact with patient found to have redness around the lateral side of the right foot and digital deformities bilateral along with nail disease with thickness and incurvation and inability for them to cut easily     Assessment:  High at risk patient with history of ulceration who is pretty ulcerative currently with digital deformities     Plan:  H&P reviewed all conditions and educated him through interpreter on condition.  Debrided nailbeds 1-5 both feet no iatrogenic bleeding and then went ahead and we did measure him for diabetic shoes due to his long-term history of ulceration digital deformities and poor health with dialysis and neuropathy.  Reappoint when ready and encouraged him to call questions concerns

## 2021-11-04 ENCOUNTER — Ambulatory Visit (INDEPENDENT_AMBULATORY_CARE_PROVIDER_SITE_OTHER): Payer: Medicare Other | Admitting: Family Medicine

## 2021-11-04 VITALS — BP 138/65 | HR 63

## 2021-11-04 DIAGNOSIS — H9313 Tinnitus, bilateral: Secondary | ICD-10-CM

## 2021-11-04 DIAGNOSIS — Z23 Encounter for immunization: Secondary | ICD-10-CM

## 2021-11-04 DIAGNOSIS — I10 Essential (primary) hypertension: Secondary | ICD-10-CM

## 2021-11-04 NOTE — Progress Notes (Signed)
Pt here for Blood pressure check per Dr. Nani Ravens  Pt currently takes: Coreg 6.25 mg bid, Norvasc 2.5 mg/d, Hydralazine 100 mg tid.  Pt reports compliance with medication.  BP Readings from Last 3 Encounters:  10/28/21 134/72  10/23/21 128/60  10/21/21 (!) 120/54     BP today: R arm 140/70   L arm: unable to take due to dialysis.   After sitting for 5 minutes :  R arm 138/65  HR today: 63  Continue current medications, follow up as needed.   Pt is also here for a Tdap vaccination. Tdap given in right deltoid, pt tolerated well.  Pt reported ringing in ears, requested audiologist referral. Ok'd per Dr Nani Ravens. Referral placed.

## 2021-11-05 ENCOUNTER — Encounter: Payer: Self-pay | Admitting: Internal Medicine

## 2021-11-11 ENCOUNTER — Other Ambulatory Visit (HOSPITAL_BASED_OUTPATIENT_CLINIC_OR_DEPARTMENT_OTHER): Payer: Self-pay

## 2021-11-11 MED ORDER — APIXABAN 5 MG PO TABS
ORAL_TABLET | ORAL | 11 refills | Status: DC
  Filled 2021-11-11: qty 60, 30d supply, fill #0
  Filled 2021-12-29: qty 60, 30d supply, fill #1
  Filled 2022-01-22: qty 60, 30d supply, fill #2
  Filled 2022-03-06: qty 60, 30d supply, fill #3
  Filled 2022-03-31: qty 60, 30d supply, fill #4

## 2021-11-11 MED ORDER — EZETIMIBE 10 MG PO TABS
ORAL_TABLET | ORAL | 3 refills | Status: DC
  Filled 2021-11-11: qty 90, 90d supply, fill #0
  Filled 2022-02-10: qty 90, 90d supply, fill #1

## 2021-11-18 ENCOUNTER — Encounter (HOSPITAL_COMMUNITY): Payer: Medicare Other | Admitting: Cardiology

## 2021-11-20 ENCOUNTER — Other Ambulatory Visit: Payer: Self-pay

## 2021-11-20 ENCOUNTER — Ambulatory Visit: Payer: Medicare Other | Attending: Family Medicine | Admitting: Audiology

## 2021-11-20 DIAGNOSIS — H903 Sensorineural hearing loss, bilateral: Secondary | ICD-10-CM | POA: Insufficient documentation

## 2021-11-20 NOTE — Procedures (Signed)
?  [facility] [place] ?[address] ?[place], [place]  [address] ?[phone] ? ?AUDIOLOGICAL  EVALUATION ? ?NAME: Kenneth [name]     ?DOB:   17-Jun-1956      ?MRN: [id number]                                                                                     ?DATE: 11/20/2021     ?REFERENT: Shelda Pal, DO ?STATUS: Outpatient ?DIAGNOSIS: Sensorineural Hearing loss, bilateral  ? ?History: ?[name] was seen for an audiological evaluation due to decreased hearing occurring in the right ear for 10+ years. [name] was accompanied to the evaluation by his wife and a [place]. Reportedly, [name] has had decreased hearing in the right ear and otalgia in the right ear for many years. [name] reports tinnitus in both ears, which is worse in the right ear.  ? ?[name] was used for interpretation during today's evaluation however interpretation should be considered fair to poor reliability. The Interpreter did not consistently interpret during the appointment. The interpreter did not consistently interpret to the patient the instructions during the audiological evaluation.  ? ?Evaluation:  ?Otoscopy showed a clear view of the tympanic membranes, bilaterally ?Tympanometry results were consistent with normal middle ear pressure and normal tympanic membrane mobility, bilaterally.  ?Audiometric testing was completed using Conventional Audiometry techniques with insert earphones and TDH headphones. Test results are consistent with a moderately-severe to severe sensorineural hearing loss, bilaterally. Sensorineural asymmetry noted at 1000-1500 Hz and 4000 Hz worse in the right ear and 8000 Hz worse in the left ear. Speech Detection Thresholds (SDT)s were obtained at 50 dB HL in the right ear and 60 dB HL in the left ear. Word Recognition Testing was not completed due to language barrier. [name] had to be re-instructed multiples times during the evaluation. Test reliability is considered  fair.  ? ?Results:  ?The test results and recommendations were reviewed with [name] and his wife via the Interpreter. Today's test results are consistent with a moderately-severe to severe sensorineural hearing loss, bilaterally. Sensorineural asymmetry noted, worse in the right ear. [name] will have communication difficulty in all listening environments. He will benefit from the use of good communication strategies and the use of hearing aids. A referral to an ENT was reviewed due to sensorineural asymmetry.  ? ?Recommendations: ?Referral to an Ear, Nose, and Throat Physician due to sensorineural asymmetric hearing loss.  ?Repeat audiological evaluation at ENT evaluation to confirm hearing sensitivity.  ?Communication Needs Assessment with an Audiologist to discuss amplification.   ? ?  ?If you have any questions please feel free to contact me at [phone]. ? ?Bari Mantis ?Audiologist, Au.D., CCC-A ?11/20/2021  11:56 AM ? ?Cc: Shelda Pal, DO ? ?

## 2021-11-24 ENCOUNTER — Other Ambulatory Visit (HOSPITAL_BASED_OUTPATIENT_CLINIC_OR_DEPARTMENT_OTHER): Payer: Self-pay

## 2021-11-24 ENCOUNTER — Other Ambulatory Visit: Payer: Self-pay | Admitting: Family Medicine

## 2021-11-24 ENCOUNTER — Other Ambulatory Visit: Payer: Self-pay | Admitting: Internal Medicine

## 2021-11-24 MED ORDER — SERTRALINE HCL 50 MG PO TABS
50.0000 mg | ORAL_TABLET | Freq: Every day | ORAL | 2 refills | Status: DC
  Filled 2021-11-24: qty 90, 90d supply, fill #0
  Filled 2022-02-10: qty 90, 90d supply, fill #1
  Filled 2022-06-04: qty 90, 90d supply, fill #2

## 2021-11-24 MED ORDER — INSULIN ASPART 100 UNIT/ML IJ SOLN
INTRAMUSCULAR | 3 refills | Status: DC
  Filled 2021-11-24: qty 10, 34d supply, fill #0
  Filled 2022-01-05: qty 10, 34d supply, fill #1

## 2021-11-25 ENCOUNTER — Telehealth: Payer: Self-pay

## 2021-11-25 NOTE — Telephone Encounter (Signed)
Shoes Ordered - [place] ?

## 2021-12-09 ENCOUNTER — Ambulatory Visit (INDEPENDENT_AMBULATORY_CARE_PROVIDER_SITE_OTHER): Payer: Medicare Other | Admitting: Family Medicine

## 2021-12-09 ENCOUNTER — Encounter: Payer: Self-pay | Admitting: Family Medicine

## 2021-12-09 VITALS — BP 134/80 | HR 64 | Temp 98.8°F | Wt 134.4 lb

## 2021-12-09 DIAGNOSIS — I739 Peripheral vascular disease, unspecified: Secondary | ICD-10-CM | POA: Diagnosis not present

## 2021-12-09 DIAGNOSIS — I25118 Atherosclerotic heart disease of native coronary artery with other forms of angina pectoris: Secondary | ICD-10-CM

## 2021-12-09 DIAGNOSIS — I10 Essential (primary) hypertension: Secondary | ICD-10-CM

## 2021-12-09 DIAGNOSIS — E1165 Type 2 diabetes mellitus with hyperglycemia: Secondary | ICD-10-CM | POA: Diagnosis not present

## 2021-12-09 DIAGNOSIS — Z794 Long term (current) use of insulin: Secondary | ICD-10-CM

## 2021-12-09 NOTE — Patient Instructions (Addendum)
Keep the diet clean and stay active. ? ?Continue to monitor blood pressure and symptoms moving forward. ? ?We are done with the Norvasc/amlodipine.  ? ?Continue the hydralazine and carvedilol.  ? ?You need to follow up with the endocrinologist regarding your sugar spiking and hypoglycemic episodes.  ? ?Let us know if you need anything. ?

## 2021-12-09 NOTE — Progress Notes (Signed)
Chief Complaint  ?Patient presents with  ? Follow-up  ?  Medication ?  ? ? ?Subjective ?Kenneth [name] is a 66 y.o. male who presents for hypertension follow up. Here w spouse and friend who help interpret.  ?He does monitor home blood pressures. ?Blood pressures ranging from 120-190's/40-80's on average. ?He is compliant with medications- stopped Norvasc 2.5 mg/d; taking Coreg 6.25 mg bid, hydralazine 100 mg TID, hold AM and noon dose on days of HD (MWF). ?Patient has these side effects of medication: lightheadedness w Norvasc ?He is adhering to a healthy diet overall- on renal diet ?Current exercise: walking ?No Cp or SOB.  ? ?Sugars spiking to the high 200's and 300's after juice and having hypoglycemic episodes with insulin pump.  ?  ?Past Medical History:  ?Diagnosis Date  ? CAD (coronary artery disease) 10/30/2016  ? NSTEMI 9/17 with CABG x 3 (LIMA-LAD, SVG-OM, SVG-RCA).   - NSTEMI 10/18 s/p DES to ostial SVG to  OM  ? CHF (congestive heart failure) ([place])   ? Depression   ? Diabetic foot ulcer ([place]) 04/11/2017  ? 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 type 1 DM  ? ESRD on hemodialysis ([place])   ? "MWF; [facility]" (03/30/2018)  ? Gastroesophageal reflux disease 08/18/2016  ? HCAP (healthcare-associated pneumonia)   ? Hyperlipidemia   ? Hypertension   ? Hypertensive heart disease 09/12/2017  ? Ischemic rest pain of lower extremity 02/06/2015  ? Keratoma 02/27/2015  ? Metatarsal deformity 02/27/2015  ? NSTEMI (non-ST elevated myocardial infarction) ([place])   ? PAF (paroxysmal atrial fibrillation) ([place])   ? Pleural effusion   ? Pronation deformity of both feet 02/27/2015  ? ? ?Exam ?BP 134/80   Pulse 64   Temp 98.8 ?F (37.1 ?C) (Oral)   Wt 134 lb 6 oz (61 kg)   SpO2 94%   BMI 21.69 kg/m?  ?General:  well developed, well nourished, in no apparent distress ?Heart: RRR, no bruits, no LE edema ?Lungs: clear to auscultation, no accessory muscle use ?Psych: well oriented with normal range of affect and appropriate  judgment/insight ? ?Peripheral arterial disease ([place]), Chronic ? ?Atherosclerotic heart disease of native coronary artery with other forms of angina pectoris ([place]), Chronic ? ?Adverse effect from med. Stop Amlodipine. Cont Coreg 6.25 mg bid, hydralazine 100 mg TID holding 1st 2 doses on HD days (MWF). Counseled on diet and exercise. Cont to monitor BP at home along with s/s's of dizziness. This may actually be related to hypoglycemia.  ?His sugars are ranging from the 80s to the 300.  He has an insulin pump and follows with endocrinology.  I will reach out to Dr. Quin Hoop office to see if they can schedule him.  He is having hypoglycemic symptoms in the 80s unfortunately. ?The patient, spouse and friend voiced understanding and agreement to the plan. ? ?I spent 45 min w the patient discussing the above plan in addition to reviewing his chart on the same day of the visit and reaching out to his endocrinologist.  ? ?Shelda Pal, DO ?12/09/21  ?3:54 PM ? ?

## 2021-12-10 ENCOUNTER — Telehealth: Payer: Self-pay | Admitting: Internal Medicine

## 2021-12-10 DIAGNOSIS — Z794 Long term (current) use of insulin: Secondary | ICD-10-CM

## 2021-12-18 ENCOUNTER — Ambulatory Visit (INDEPENDENT_AMBULATORY_CARE_PROVIDER_SITE_OTHER): Payer: Medicare Other | Admitting: Internal Medicine

## 2021-12-18 ENCOUNTER — Other Ambulatory Visit (HOSPITAL_BASED_OUTPATIENT_CLINIC_OR_DEPARTMENT_OTHER): Payer: Self-pay

## 2021-12-18 ENCOUNTER — Encounter: Payer: Self-pay | Admitting: Internal Medicine

## 2021-12-18 VITALS — BP 130/70 | HR 74 | Ht 66.0 in | Wt 136.8 lb

## 2021-12-18 DIAGNOSIS — Z794 Long term (current) use of insulin: Secondary | ICD-10-CM | POA: Diagnosis not present

## 2021-12-18 DIAGNOSIS — Z992 Dependence on renal dialysis: Secondary | ICD-10-CM

## 2021-12-18 DIAGNOSIS — E1165 Type 2 diabetes mellitus with hyperglycemia: Secondary | ICD-10-CM

## 2021-12-18 DIAGNOSIS — E1122 Type 2 diabetes mellitus with diabetic chronic kidney disease: Secondary | ICD-10-CM | POA: Diagnosis not present

## 2021-12-18 DIAGNOSIS — E1142 Type 2 diabetes mellitus with diabetic polyneuropathy: Secondary | ICD-10-CM

## 2021-12-18 DIAGNOSIS — N186 End stage renal disease: Secondary | ICD-10-CM

## 2021-12-18 MED ORDER — PEN NEEDLES 32G X 6 MM MISC
1.0000 | Freq: Three times a day (TID) | 1 refills | Status: AC
  Filled 2021-12-18: qty 200, 67d supply, fill #0

## 2021-12-18 MED ORDER — TOUJEO MAX SOLOSTAR 300 UNIT/ML ~~LOC~~ SOPN
15.0000 [IU] | PEN_INJECTOR | Freq: Every day | SUBCUTANEOUS | 1 refills | Status: DC
  Filled 2021-12-18: qty 15, 100d supply, fill #0

## 2021-12-18 NOTE — Progress Notes (Signed)
?Name: Kenneth [name]  ?MRN/ DOB: [id number], May 03, 1956   ?Age/ Sex: 66 y.o., male   ? ?PCP: Shelda Pal, DO   ?Reason for Endocrinology Evaluation: Type 2 Diabetes Mellitus  ?   ?Date of Initial Endocrinology Visit: 10/28/2021  ? ? ?PATIENT IDENTIFIER: Kenneth [name] is a 66 y.o. male with a past medical history of T2DM, CAD, CHF , HTN and ESRD on HD since 2017. The patient presented for initial endocrinology clinic visit on 10/28/2021 for consultative assistance with his diabetes management.  ? ? ?HPI: ?Kenneth [name] transitioned care from Dr. Hartford Poli . He is accompanied by his wife and interpreter  ? ? ?Diagnosed with DM years ago  > 20 yrs ago . Islet cell Ab negative 2021 ?Currently checking blood sugars multiple  x / day,  through freestyle libre   ?Hypoglycemia episodes : yes                Symptoms: shaking                  Frequency: 1/ week ?Hemoglobin A1c has ranged from 5.2% in 2022, peaking at 8.1% in 2021. ? ? ?In terms of diet, the patient eats 2 meals a day. Snacks occasionally  ? ? ? ?Nephrology : Dr. Moshe Cipro . Pt on HD  Monday, Wednesday and Friday  ?Pulmonary : Dr. Silas Flood  10/2021 for dyspnea  ? ? ? ?SUBJECTIVE:  ? ?During the last visit (10/28/2021): A1c 5.2% with continued insulin pump settings, he was referred to our CDE for proper pump training and he was not using insulin pump correctly which was causing hypoglycemia but he did not make it to that appointment ? ?Today (12/18/21): Kenneth [name] is here for follow-up on diabetes management.  He is accompanied by his wife and an interpreter.  He  checks his blood sugars multiple times daily, through CGM. The patient has  had hypoglycemic episodes since the last clinic visit, which typically occur 3 x /week - most often occuring during the night and day . The patient is symptomatic with these episodes. ? ?He was seen for pretransplant evaluation on 12/10/2021 [facility] kidney transplant, Gaspar Cola ?Denies vomiting or diarrhea recently but has  chronic nausea ?He has noted hyperglycemia over the past week that he attributes to pump malfunction and has been using toujeo  daily for the past 2-3 weeks. He had changed the location of the pump without help  ? ? ?This patient with type 2 diabetes is treated with Medtronic  (insulin pump). During the visit the pump basal and bolus doses were reviewed including carb/insulin rations and supplemental doses. The clinical list was updated. The glucose meter download was reviewed in detail to determine if the current pump settings are providing the best glycemic control without excessive hypoglycemia. ? ?Pump and meter download: ? ? ? ?Pump   Medtronic  Settings   ?Insulin type   Novolog    ?Basal rate      ? 0000 0.500  u/h   ? 0600 0.550 u/h   ? 2200 0.500 u/h   ?    ?I:C ratio      ? 0000 1:12  ?    ?    ?    ?    ?Sensitivity      ? 0000  60  ?    ?Goal      ? 0000  100-120  ?  ?  ? ? ? ? ? ? ?  Type & Model of Pump: medtronic  ?Insulin Type: Currently using novolog . ? ? ?PUMP STATISTICS: ?Average BG: 214+/- 115 ?BG Readings: 5.3 / day ?Average Daily Carbs (g): 973+/- 89 ?Average Total Daily Insulin: 26.8 +/- 11.4 ?Average Daily Basal: 12.8 (48 %) ?Average Daily Bolus: 14 (52 %) ? ? ? ? ?CONTINUOUS GLUCOSE MONITORING RECORD INTERPRETATION   ? ?Dates of Recording: 3/24-12/18/2021 ? ?Sensor description:freestyle libre  ? ?Results statistics: ?  ?CGM use % of time 92  ?Average and SD 235/48.6  ?Time in range   39 %  ?% Time Above 180 16  ?% Time above 250 44  ?% Time Below target 1  ? ? ?Glycemic patterns summary: BG's optimal at night with hyperglycemia noted during the night  ? ?Hyperglycemic episodes  postprandial  ? ?Hypoglycemic episodes occurred fasting and after a bolus ? ?Overnight periods: trends down  ? ? ?HOME DIABETES REGIMEN: ?Novolog  ? ? ? ? ?Statin: yes  ?ACE-I/ARB: no ? ? ? ? ? ?DIABETIC COMPLICATIONS: ?Microvascular complications:  ?ESRD on HD , neuropathy ?Denies: retinopathy  ?Last eye exam: Completed   ? ?Macrovascular complications:  ?CAD ( S/P CABG 2017) ?Denies: PVD, CVA ? ? ?PAST HISTORY: ?Past Medical History:  ?Past Medical History:  ?Diagnosis Date  ? CAD (coronary artery disease) 10/30/2016  ? NSTEMI 9/17 with CABG x 3 (LIMA-LAD, SVG-OM, SVG-RCA).   - NSTEMI 10/18 s/p DES to ostial SVG to  OM  ? CHF (congestive heart failure) ([place])   ? Depression   ? Diabetic foot ulcer ([place]) 04/11/2017  ? 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 type 1 DM  ? ESRD on hemodialysis ([place])   ? "MWF; [facility]" (03/30/2018)  ? Gastroesophageal reflux disease 08/18/2016  ? Hyperlipidemia   ? Hypertension   ? Ischemic rest pain of lower extremity 02/06/2015  ? Keratoma 02/27/2015  ? Metatarsal deformity 02/27/2015  ? PAF (paroxysmal atrial fibrillation) ([place])   ? Pronation deformity of both feet 02/27/2015  ? ?Past Surgical History:  ?Past Surgical History:  ?Procedure Laterality Date  ? A/V FISTULAGRAM Left 03/06/2021  ? Procedure: A/V FISTULAGRAM;  Surgeon: Marty Heck, MD;  Location: [place] [facility];  Service: Cardiovascular;  Laterality: Left;  ? AV FISTULA PLACEMENT Left 06/22/2016  ? Procedure: ARTERIOVENOUS (AV) FISTULA CREATION LEFT UPPER ARM;  Surgeon: Rosetta Posner, MD;  Location: [place];  Service: Vascular;  Laterality: Left;  ? BIOPSY  12/19/2020  ? Procedure: BIOPSY;  Surgeon: Ladene Artist, MD;  Location: [place];  Service: Endoscopy;;  ? CARDIAC CATHETERIZATION N/A 05/31/2016  ? Procedure: Left Heart Cath and Coronary Angiography;  Surgeon: Jettie Booze, MD;  Location: [place] [facility];  Service: Cardiovascular;  Laterality: N/A;  ? CARDIAC CATHETERIZATION N/A 05/31/2016  ? Procedure: Right Heart Cath;  Surgeon: Jettie Booze, MD;  Location: [place] [facility];  Service: Cardiovascular;  Laterality: N/A;  ? CARDIAC CATHETERIZATION N/A 05/31/2016  ? Procedure: IABP Insertion;  Surgeon: Jettie Booze, MD;  Location: [place] [facility];  Service: Cardiovascular;  Laterality: N/A;   ? COLONOSCOPY WITH PROPOFOL N/A 12/20/2020  ? Procedure: COLONOSCOPY WITH PROPOFOL;  Surgeon: Mauri Pole, MD;  Location: [facility];  Service: Endoscopy;  Laterality: N/A;  ? CORONARY ARTERY BYPASS GRAFT N/A 06/05/2016  ? Procedure: CORONARY ARTERY BYPASS GRAFTING (CABG) x3 LIMA to LAD -SVG to OM -SVG to RCA;  Surgeon: Ivin Poot, MD;  Location: [place];  Service: Open Heart Surgery;  Laterality: N/A;  ? CORONARY STENT INTERVENTION N/A 07/07/2017  ? Procedure: CORONARY STENT INTERVENTION;  Surgeon: Wellington Hampshire, MD;  Location: [facility];  Service: Cardiovascular;  Laterality: N/A;  ? CORONARY STENT INTERVENTION N/A 03/30/2018  ? Procedure: CORONARY STENT INTERVENTION;  Surgeon: Martinique, Peter M, MD;  Location: [place] [facility];  Service: Cardiovascular;  Laterality: N/A;  ? ESOPHAGOGASTRODUODENOSCOPY (EGD) WITH PROPOFOL N/A 12/19/2020  ? Procedure: ESOPHAGOGASTRODUODENOSCOPY (EGD) WITH PROPOFOL;  Surgeon: Ladene Artist, MD;  [facility];  Service: Endoscopy;  Laterality: N/A;  ? GIVENS CAPSULE STUDY N/A 12/20/2020  ? Procedure: GIVENS CAPSULE STUDY;  Surgeon: Mauri Pole, MD;  Location: [facility];  Service: Endoscopy;  Laterality: N/A;  ? INSERTION OF DIALYSIS CATHETER N/A 06/05/2016  ? Procedure: INSERTION OF DIALYSIS/trialysis CATHETER;  Surgeon: Ivin Poot, MD;  Location[facility];  Service: Vascular;  Laterality: N/A;  ? INSERTION OF DIALYSIS CATHETER Right 06/13/2016  ? Procedure: INSERTION OF DIALYSIS CATHETER RIGHT INTERNAL JUGULAR;  Surgeon: Conrad Newell, MD;  Location: [place];  Service: Vascular;  Laterality: Right;  ? INSERTION OF DIALYSIS CATHETER Right 09/07/2019  ? Procedure: Insertion Of Dialysis Catheter;  Surgeon: Rosetta Posner, MD;  Location: [facility] OR;  Service: Vascular;  Laterality: Right;  ? INTRAOPERATIVE TRANSESOPHAGEAL ECHOCARDIOGRAM N/A 06/05/2016  ? Procedure: INTRAOPERATIVE TRANSESOPHAGEAL ECHOCARDIOGRAM;  Surgeon: Ivin Poot, MD;  Location: [place];  Service: Open Heart Surgery;  Laterality: N/A;  ? LEFT HEART CATH AND CORS/GRAFTS ANGIOGRAPHY N/A 11/03/2016  ? Procedure: Left Heart Cath and Cors/Grafts Angiography;  Surgeon: Troy Sine, Jerilynn Mages

## 2021-12-18 NOTE — Patient Instructions (Signed)
Toujeo 15 units daily  ?Novolog 6 units with each meal  ?Novolog correctional insulin: ADD extra units on insulin to your meal-time Novolog dose if your blood sugars are higher than 180. Use the scale below to help guide you:  ? ?Blood sugar before meal Number of units to inject  ?Less than 180 0 unit  ?181 -  230 1 units  ?231 -  280 2 units  ?281 -  330 3 units  ?331 -  380 4 units  ?381 -  430 5 units  ?431 -  480 6 units  ?481 -  530 7 units  ? ? ? ?HOW TO TREAT LOW BLOOD SUGARS (Blood sugar LESS THAN 70 MG/DL) ?Please follow the RULE OF 15 for the treatment of hypoglycemia treatment (when your (blood sugars are less than 70 mg/dL)  ? ?STEP 1: Take 15 grams of carbohydrates when your blood sugar is low, which includes:  ?3-4 GLUCOSE TABS  OR ?3-4 OZ OF JUICE OR REGULAR SODA OR ?ONE TUBE OF GLUCOSE GEL   ? ?STEP 2: RECHECK blood sugar in 15 MINUTES ?STEP 3: If your blood sugar is still low at the 15 minute recheck --> then, go back to STEP 1 and treat AGAIN with another 15 grams of carbohydrates. ? ?

## 2021-12-18 NOTE — Telephone Encounter (Signed)
Scheduled

## 2021-12-19 ENCOUNTER — Telehealth: Payer: Self-pay | Admitting: Dietician

## 2021-12-19 NOTE — Telephone Encounter (Signed)
Spoke to Melonie Florida re:  appointment for insulin pump retraining.  ?Patient's Medtronic pump failed and his new pump has arrived.  ?Left message for Vaughan Basta to call Bonnita Nasuti for a date to train. ?He is scheduled to see me on 4/27 for nutrition  ? ?Antonieta Iba, RD, LDN, CDCES ? ?

## 2021-12-22 ENCOUNTER — Encounter: Payer: Self-pay | Admitting: Internal Medicine

## 2021-12-23 NOTE — Telephone Encounter (Signed)
Opened in error

## 2021-12-24 NOTE — Telephone Encounter (Signed)
Appointment made

## 2021-12-29 ENCOUNTER — Other Ambulatory Visit (HOSPITAL_BASED_OUTPATIENT_CLINIC_OR_DEPARTMENT_OTHER): Payer: Self-pay

## 2021-12-30 ENCOUNTER — Encounter: Payer: Medicare Other | Attending: Internal Medicine | Admitting: Nutrition

## 2021-12-30 DIAGNOSIS — N186 End stage renal disease: Secondary | ICD-10-CM | POA: Insufficient documentation

## 2021-12-30 DIAGNOSIS — Z794 Long term (current) use of insulin: Secondary | ICD-10-CM | POA: Insufficient documentation

## 2021-12-30 DIAGNOSIS — Z992 Dependence on renal dialysis: Secondary | ICD-10-CM | POA: Insufficient documentation

## 2021-12-30 DIAGNOSIS — Z713 Dietary counseling and surveillance: Secondary | ICD-10-CM | POA: Insufficient documentation

## 2021-12-30 DIAGNOSIS — E1122 Type 2 diabetes mellitus with diabetic chronic kidney disease: Secondary | ICD-10-CM | POA: Insufficient documentation

## 2021-12-30 NOTE — Patient Instructions (Signed)
When you bolus, put in carbs eaten, and blood sugar readings. ? ?

## 2021-12-30 NOTE — Progress Notes (Signed)
Patient is here with his wife and interpreter to transfer settings from his old pump to his new 630G pump.  Settings were transferred:  Basal rate: MN: 0.4, 6AM: 0.45, 6PM: 0.5.  I/C: 12,  ISF: 50, Target: 110 with corrections over 120, timing: 4 hours. ?He did not bring his cartridges or infusion sets.  He is using a freestyle sensor, but says he has no meter to use for a back up.Marland Kitchen  He was given a voucher for a free OneTouch verio meter with 30 free test stips, and told to bring the voucher to the drug store to get the free meter.  He agreed to do this and was very grateful. ?His old pump was put into the box and bag to be mailed back.   ?They had no final questions.   ?

## 2022-01-01 ENCOUNTER — Ambulatory Visit (INDEPENDENT_AMBULATORY_CARE_PROVIDER_SITE_OTHER): Payer: Medicare Other

## 2022-01-01 ENCOUNTER — Telehealth: Payer: Self-pay | Admitting: Family Medicine

## 2022-01-01 ENCOUNTER — Encounter: Payer: Self-pay | Admitting: Podiatry

## 2022-01-01 ENCOUNTER — Other Ambulatory Visit: Payer: Self-pay | Admitting: Internal Medicine

## 2022-01-01 ENCOUNTER — Other Ambulatory Visit (HOSPITAL_BASED_OUTPATIENT_CLINIC_OR_DEPARTMENT_OTHER): Payer: Self-pay

## 2022-01-01 ENCOUNTER — Ambulatory Visit (INDEPENDENT_AMBULATORY_CARE_PROVIDER_SITE_OTHER): Payer: 59 | Admitting: Podiatry

## 2022-01-01 DIAGNOSIS — E1040 Type 1 diabetes mellitus with diabetic neuropathy, unspecified: Secondary | ICD-10-CM | POA: Diagnosis not present

## 2022-01-01 DIAGNOSIS — M2041 Other hammer toe(s) (acquired), right foot: Secondary | ICD-10-CM

## 2022-01-01 DIAGNOSIS — M2042 Other hammer toe(s) (acquired), left foot: Secondary | ICD-10-CM | POA: Diagnosis not present

## 2022-01-01 DIAGNOSIS — E1149 Type 2 diabetes mellitus with other diabetic neurological complication: Secondary | ICD-10-CM

## 2022-01-01 DIAGNOSIS — E1140 Type 2 diabetes mellitus with diabetic neuropathy, unspecified: Secondary | ICD-10-CM | POA: Diagnosis not present

## 2022-01-01 DIAGNOSIS — E10621 Type 1 diabetes mellitus with foot ulcer: Secondary | ICD-10-CM

## 2022-01-01 DIAGNOSIS — L97411 Non-pressure chronic ulcer of right heel and midfoot limited to breakdown of skin: Secondary | ICD-10-CM | POA: Diagnosis not present

## 2022-01-01 MED ORDER — ONETOUCH VERIO VI STRP
1.0000 | ORAL_STRIP | Freq: Four times a day (QID) | 2 refills | Status: AC
  Filled 2022-01-01: qty 400, 100d supply, fill #0

## 2022-01-01 MED FILL — Atorvastatin Calcium Tab 80 MG (Base Equivalent): ORAL | 90 days supply | Qty: 90 | Fill #0 | Status: AC

## 2022-01-01 NOTE — Progress Notes (Signed)
Subjective:  ? ?Patient ID: Kenneth [name], male   DOB: 66 y.o.   MRN: [id number]  ? ?HPI ?Patient presents concerned about lesions on both feet concerned about nail disease and here to pick up diabetic shoes ? ? ?ROS ? ? ?   ?Objective:  ?Physical Exam  ?Neurovascular status unchanged long-term diabetic with ulceration history right plantar fifth metatarsal that is under control now who has not been a good job of taking care of himself and has history of breakdown of tissue ? ?   ?Assessment:  ?Strong at risk patient with lesion formation and diabetes ? ?   ?Plan:  ?I did go ahead today and I debrided some tissue to reduce some of the pressure he is experiencing and diabetic shoes are dispensed and I gave him education on diabetic foot care and daily inspections.  He is to be seen back on an as-needed basis ?   ? ? ?

## 2022-01-01 NOTE — Progress Notes (Signed)
SITUATION ?Reason for Visit: Fitting of Diabetic [place] ?Patient / Caregiver Report:  Patient is satisfied with fit and function of shoes and insoles. ? ?OBJECTIVE DATA: ?Patient History / Diagnosis:   ?  ICD-10-CM   ?1. Diabetic neuropathy with neurologic complication (HCC)  I95.18   ? E11.49   ?  ?2. Hammer toes of both feet  M20.41   ? M20.42   ?  ? ? ?Change in Status:   None ? ?ACTIONS PERFORMED: ?In-Person Delivery, patient was fit with: ?- 1x pair [id number] PDAC approved prefabricated Diabetic Shoes: [employer] [address] 7XW ?- 3x pair [id number] PDAC approved vacuum formed custom diabetic insoles; [employer]: [id number] ? ?Shoes and insoles were verified for structural integrity and safety. Patient wore shoes and insoles in office. Skin was inspected and free of areas of concern after wearing shoes and inserts. Shoes and inserts fit properly. Patient / Caregiver provided with ferbal instruction and demonstration regarding donning, doffing, wear, care, proper fit, function, purpose, cleaning, and use of shoes and insoles ' and in all related precautions and risks and benefits regarding shoes and insoles. Patient / Caregiver was instructed to wear properly fitting socks with shoes at all times. Patient was also provided with verbal instruction regarding how to report any failures or malfunctions of shoes or inserts, and necessary follow up care. Patient / Caregiver was also instructed to contact physician regarding change in status that may affect function of shoes and inserts.  ? ?Patient / Caregiver verbalized undersatnding of instruction provided. Patient / Caregiver demonstrated independence with proper donning and doffing of shoes and inserts. ? ?PLAN ?Patient to follow with treating physician as recommended. Plan of care was discussed with and agreed upon by patient and/or caregiver. All questions were answered and concerns addressed. ? ?

## 2022-01-01 NOTE — Telephone Encounter (Signed)
Patient dropped off DMV form ?Patient would like to be called when ready to pick up ?Placed in wendling bin up front ?

## 2022-01-02 ENCOUNTER — Other Ambulatory Visit (HOSPITAL_BASED_OUTPATIENT_CLINIC_OR_DEPARTMENT_OTHER): Payer: Self-pay

## 2022-01-02 ENCOUNTER — Telehealth: Payer: Self-pay | Admitting: Family Medicine

## 2022-01-02 NOTE — Telephone Encounter (Signed)
Patients friend Bonnita Nasuti) called stating the patient has a yellowish color on face. ?He was given an injection on the 18th by opthamologist and they called that office to see if that may be the cause, but were told was not and needs to followup with his PCP to have some lab work done . ?Scheduled appointment for Monday 01/05/22 at 4 and told the patients friend Bonnita Nasuti) to inform the patient and wife if any changes to go to the ER over the weekend. ?She verbalized agreement.  ?

## 2022-01-02 NOTE — Telephone Encounter (Signed)
Form completed/ ?Patient informed to pickup at the front desk. ?

## 2022-01-05 ENCOUNTER — Encounter: Payer: Self-pay | Admitting: Family Medicine

## 2022-01-05 ENCOUNTER — Ambulatory Visit (INDEPENDENT_AMBULATORY_CARE_PROVIDER_SITE_OTHER): Payer: Medicare Other | Admitting: Family Medicine

## 2022-01-05 ENCOUNTER — Other Ambulatory Visit (HOSPITAL_BASED_OUTPATIENT_CLINIC_OR_DEPARTMENT_OTHER): Payer: Self-pay

## 2022-01-05 ENCOUNTER — Telehealth: Payer: Self-pay

## 2022-01-05 ENCOUNTER — Other Ambulatory Visit (HOSPITAL_COMMUNITY): Payer: Self-pay

## 2022-01-05 VITALS — BP 132/64 | HR 64 | Temp 98.4°F | Wt 130.0 lb

## 2022-01-05 DIAGNOSIS — R17 Unspecified jaundice: Secondary | ICD-10-CM

## 2022-01-05 DIAGNOSIS — I10 Essential (primary) hypertension: Secondary | ICD-10-CM

## 2022-01-05 NOTE — Progress Notes (Signed)
Chief Complaint  ?Patient presents with  ? Follow-up  ? ? ?Subjective ?Kenneth [name] is a 66 y.o. [name] who presents for hypertension follow up. He is here w his wife and also with the aid of a virtual [name].  ?He does monitor home blood pressures. ?Blood pressures ranging from 130's/50-60's on average. ?He is compliant with medications- Hydralazine 100 mg TID, hold AM and noon doses on HD days, Coreg 6.25 mg bid holding AM dose on . ?Patient has these side effects of medication: none; swelling is better ?He is adhering to a healthy diet overall. ?Current exercise: some walking ?No CP or SOB. ? ?Jaundice ?Had injection in eye a couple weeks ago, skin turned yellow. Family concerned about liver function. He had a deep cleaning for his teeth just before and took some Tylenol for pain. He does not drink alcohol. He does not feel that his skin is still yellow.  ?  ?Past Medical History:  ?Diagnosis Date  ? CAD (coronary artery disease) 10/30/2016  ? NSTEMI 9/17 with CABG x 3 (LIMA-LAD, SVG-OM, SVG-RCA).   - NSTEMI 10/18 s/p DES to ostial SVG to  OM  ? CHF (congestive heart failure) ([place])   ? Depression   ? Diabetic foot ulcer ([place]) 04/11/2017  ? 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 type 1 DM  ? ESRD on hemodialysis ([place])   ? "MWF; [facility]" (03/30/2018)  ? Gastroesophageal reflux disease 08/18/2016  ? Hyperlipidemia   ? Hypertension   ? Ischemic rest pain of lower extremity 02/06/2015  ? Keratoma 02/27/2015  ? Metatarsal deformity 02/27/2015  ? PAF (paroxysmal atrial fibrillation) ([place])   ? Pronation deformity of both feet 02/27/2015  ? ? ?Exam ?BP 132/64   Pulse 64   Temp 98.4 ?F (36.9 ?C) (Oral)   Wt 130 lb (59 kg)   SpO2 93%   BMI 20.98 kg/m?  ?General:  well developed, well nourished, in no apparent distress ?Heart: RRR, no bruits, no LE edema ?Lungs: clear to auscultation, no accessory muscle use ?Abd: BS+, S, NT, ND, no masses or organomegaly ?Skin: No jaundice, no spider  angiomas ?Neuro: No asterixis ?Psych: well oriented with normal range of affect and appropriate judgment/insight ? ?Essential hypertension ? ?Jaundice - Plan: Hepatic function panel ? ?Chronic, stable. LE edema improved after change. Counseled on diet and exercise. Cont hydralazine 100 mg TID, hold AM and afternoon dosing on HD days, Coreg 6.25 mg bid, holding AM dose on HD days.  ?New problem, uncertain prog. No signs of cirrhosis on exam. Ck hep function panel. Tested neg for Hep B/C in past. If elevated LFT's, will ck RUQ [place]. Would like results phoned to family friend Melonie Florida [phone] ?F/u in 6 mo for CPE or prn. ?The patient/wife thru the interpreter voiced understanding and agreement to the plan. ? ?Shelda Pal, DO ?01/05/22  ?4:11 PM ? ?

## 2022-01-05 NOTE — Patient Instructions (Signed)
Give us 2-3 business days to get the results of your labs back.   Keep the diet clean and stay active.  Let us know if you need anything. 

## 2022-01-05 NOTE — Telephone Encounter (Signed)
I ran a test claim for Humalog 100 unit/ml & it is covered.  No PA needed.  $0 copay. ?

## 2022-01-06 ENCOUNTER — Other Ambulatory Visit (HOSPITAL_BASED_OUTPATIENT_CLINIC_OR_DEPARTMENT_OTHER): Payer: Self-pay

## 2022-01-06 ENCOUNTER — Telehealth: Payer: Self-pay

## 2022-01-06 LAB — HEPATIC FUNCTION PANEL
ALT: 14 U/L (ref 0–53)
AST: 22 U/L (ref 0–37)
Albumin: 4.1 g/dL (ref 3.5–5.2)
Alkaline Phosphatase: 85 U/L (ref 39–117)
Bilirubin, Direct: 0.1 mg/dL (ref 0.0–0.3)
Total Bilirubin: 0.7 mg/dL (ref 0.2–1.2)
Total Protein: 6.8 g/dL (ref 6.0–8.3)

## 2022-01-06 MED ORDER — LYUMJEV 100 UNIT/ML IJ SOLN
INTRAMUSCULAR | 6 refills | Status: DC
  Filled 2022-01-06 – 2022-01-13 (×2): qty 20, 67d supply, fill #0
  Filled 2022-02-26 – 2022-03-05 (×3): qty 20, 67d supply, fill #1
  Filled 2022-05-12: qty 20, 67d supply, fill #2
  Filled 2022-07-16: qty 20, 67d supply, fill #3
  Filled 2022-10-08: qty 20, 67d supply, fill #4

## 2022-01-06 NOTE — Telephone Encounter (Signed)
Novolog is not covered, would you like to switch to Humalog or Lyumjev.  ?

## 2022-01-07 ENCOUNTER — Other Ambulatory Visit (HOSPITAL_BASED_OUTPATIENT_CLINIC_OR_DEPARTMENT_OTHER): Payer: Self-pay

## 2022-01-08 ENCOUNTER — Encounter: Payer: Medicare Other | Admitting: Dietician

## 2022-01-08 ENCOUNTER — Encounter: Payer: Self-pay | Admitting: Dietician

## 2022-01-08 DIAGNOSIS — Z794 Long term (current) use of insulin: Secondary | ICD-10-CM | POA: Diagnosis not present

## 2022-01-08 DIAGNOSIS — E1122 Type 2 diabetes mellitus with diabetic chronic kidney disease: Secondary | ICD-10-CM | POA: Diagnosis not present

## 2022-01-08 DIAGNOSIS — Z992 Dependence on renal dialysis: Secondary | ICD-10-CM | POA: Diagnosis not present

## 2022-01-08 DIAGNOSIS — Z713 Dietary counseling and surveillance: Secondary | ICD-10-CM | POA: Diagnosis present

## 2022-01-08 DIAGNOSIS — N186 End stage renal disease: Secondary | ICD-10-CM

## 2022-01-08 NOTE — Patient Instructions (Addendum)
When eating at a restaurant, ask for food cooked fresh for you. ? Fish or tofu, rice, steamed vegetables prepared without salt or soy sauce. ? ?When cooking at home, prepare food without salt or soy sauce. ? Season with rice vinegar, garlic, onions, sesame seeds, sesame oil ? ?Choose tofu or fish most often or foods made with mung bean have good protein. ? ?Eat 2-3 days per week. ? ?Breakfast ideas to pack ? Peanut butter sandwich with berries or apple ? Bagel with 1 slice low sodium cheese with berries or apple ? ?Prepare extra food when you cook and put in bowls for later in the week.  ?

## 2022-01-08 NOTE — Progress Notes (Signed)
? ? ?Diabetes Self-Management Education ? ?Visit Type: First/Initial ? ?Appt. Start Time: 1540 Appt. End Time: 6387 ? ?01/08/2022 ? ?[name], identified by name and date of birth, is a 66 y.o. male with a diagnosis of Diabetes: Type 2.  ? ?ASSESSMENT ?Kenneth is here today with a member from his church who is interpreting for Kenneth [name]). ?His wife is in the car, has vomited and unable to be at the appointment today. ?His renal RD created a poster in his language for his renal diet and is on dialysis 3 times per week since 2017.  He has been rejected twice for transplant and has been referred for this again. ?He wants further information from me due to his diabetes. ? ?History includes Type 2 Diabetes, ESRD on HD 3 times per week, CHF, CAD s/p CABG and stents, HTN, HLD ?Insulin Pump:  Medtronic 630G ?CGM:  Dexcom G6  Sensor reading was 109 during this visit. ?A1C:  7.1% 10/28/2021 (not reliable due to ESRD) ?Medications include:  Novolog per pump, phoslo ? ?Weight hx: ?137 lbs 01/08/2022 ?147 lbs 2 months ago per Kenneth. ? ?Kenneth lives with his wife.  His wife does the shopping and cooking.  His wife tries to cook appropriately for Kenneth but sometimes Kenneth does not want to eat that and gets what he wants.  He states that he tries to follow a low sodium diet but eats most meals out which are high in sodium.  Eats only 1-2 meals per day particularly on hemodialysis days.  Counts carbohydrates and boluses for blood sugar 4-5 times per day. ?He is from [place].  He reads and speaks some English but does require an interpretor. ? ?Height 5\' 6"  (1.676 m), weight 137 lb (62.1 kg). ?Body mass index is 22.11 kg/m?. ? ? Diabetes Self-Management Education - 01/08/22 1752   ? ?  ? Visit Information  ? Visit Type First/Initial   ?  ? Initial Visit  ? Diabetes Type Type 2   ? Are you currently following a meal plan? No   ? Are you taking your medications as prescribed? Yes   ?  ? Psychosocial Assessment  ?  Self-care barriers English as a second language   ? Self-management support Doctor's office;Church   ? Other persons present Kenneth;Interpreter   ? Kenneth Concerns Nutrition/Meal planning;Weight Control   ? Special Needs Other (comment)   speak with interpretor  ? Preferred Learning Style No preference indicated   ? Learning Readiness Contemplating   ?  ? Pre-Education Assessment  ? Kenneth understands the diabetes disease and treatment process. Demonstrates understanding / competency   ? Kenneth understands incorporating nutritional management into lifestyle. Needs Review   ? Kenneth understands using medications safely. Demonstrates understanding / competency   ? Kenneth understands monitoring blood glucose, interpreting and using results Needs Review   ? Kenneth understands prevention, detection, and treatment of acute complications. Needs Instruction   ? Kenneth understands prevention, detection, and treatment of chronic complications. Needs Review   ? Kenneth understands how to develop strategies to address psychosocial issues. Needs Review   ? Kenneth understands how to develop strategies to promote health/change behavior. Needs Review   ?  ? Complications  ? Last HgB A1C per Kenneth/outside source 7.1 %   10/2021  ? Are you checking your feet? Yes   ?  ? Dietary Intake  ? Breakfast usually skips or 1 slice 2hite toast (plain)   ? Lunch Traditional [place] food eaten  out.  Often a stew with rice, kim chi, bean paste.   ? Snack (afternoon) 1/2 apple   ? Dinner similar to lunch   ? Snack (evening) none   ? Beverage(s) water, coffee, tea   ?  ? Kenneth Education  ? Previous Diabetes Education Yes (please comment)   unknown but understands carbohydrate counting  ? Disease Pathophysiology Other (comment)   impact of sodium intake on overall health, renal dz and heart  ? Healthy Eating Food label reading, portion sizes and measuring food.;Meal options for control of blood glucose level and chronic  complications.;Information on hints to eating out and maintain blood glucose control.   ? Acute complications Taught prevention, symptoms, and  treatment of hypoglycemia - the 15 rule.   ? Diabetes Stress and Support Worked with Kenneth to identify barriers to care and solutions   ?  ? Individualized Goals (developed by Kenneth)  ? Nutrition General guidelines for healthy choices and portions discussed;Other (comment)   low sodium renal for HD  ? Physical Activity Exercise 5-7 days per week;30 minutes per day   ? Medications take my medication as prescribed   ? Monitoring  Test my blood glucose as discussed   ? Problem Solving Eating Pattern;Addressing barriers to behavior change   ? Reducing Risk examine blood glucose patterns;do foot checks daily   ?  ? Post-Education Assessment  ? Kenneth understands the diabetes disease and treatment process. Demonstrates understanding / competency   ? Kenneth understands incorporating nutritional management into lifestyle. Comprehends key points   ? Kenneth undertands incorporating physical activity into lifestyle. Needs Review   ? Kenneth understands using medications safely. Comphrehends key points   ? Kenneth understands monitoring blood glucose, interpreting and using results Comprehends key points   ? Kenneth understands prevention, detection, and treatment of acute complications. Demonstrates understanding / competency   ? Kenneth understands prevention, detection, and treatment of chronic complications. Demonstrates understanding / competency   ? Kenneth understands how to develop strategies to address psychosocial issues. Comprehends key points   ? Kenneth understands how to develop strategies to promote health/change behavior. Comprehends key points   ?  ? Outcomes  ? Expected Outcomes Demonstrated interest in learning but significant barriers to change   ? Future DMSE PRN   ? Program Status Completed   ? ?  ?  ? ?  ? ? ?Individualized Plan for Diabetes Self-Management  Training:  ? ?Learning Objective:  Kenneth will have a greater understanding of diabetes self-management. ?Kenneth education plan is to attend individual and/or group sessions per assessed needs and concerns. ?  ?Plan:  ? ?Kenneth Instructions  ?When eating at a restaurant, ask for food cooked fresh for you. ? Fish or tofu, rice, steamed vegetables prepared without salt or soy sauce. ? ?When cooking at home, prepare food without salt or soy sauce. ? Season with rice vinegar, garlic, onions, sesame seeds, sesame oil ? ?Choose tofu or fish most often or foods made with mung bean have good protein. ? ?Eat 2-3 days per week. ? ?Breakfast ideas to pack ? Peanut butter sandwich with berries or apple ? Bagel with 1 slice low sodium cheese with berries or apple ? ?Prepare extra food when you cook and put in bowls for later in the week.  ? ?Expected Outcomes:  Demonstrated interest in learning but significant barriers to change ? ?Education material provided: pages of vegetables and fruits that are allowed on a hemodialysis diet. ? ?If problems or questions, Kenneth  to contact team via:  Phone ? ?Future DSME appointment: PRN ? ?

## 2022-01-08 NOTE — Telephone Encounter (Signed)
Patient notified

## 2022-01-09 ENCOUNTER — Other Ambulatory Visit (HOSPITAL_BASED_OUTPATIENT_CLINIC_OR_DEPARTMENT_OTHER): Payer: Self-pay

## 2022-01-12 ENCOUNTER — Other Ambulatory Visit (HOSPITAL_BASED_OUTPATIENT_CLINIC_OR_DEPARTMENT_OTHER): Payer: Self-pay

## 2022-01-13 ENCOUNTER — Other Ambulatory Visit (HOSPITAL_BASED_OUTPATIENT_CLINIC_OR_DEPARTMENT_OTHER): Payer: Self-pay

## 2022-01-13 MED ORDER — INSULIN ASPART 100 UNIT/ML IJ SOLN
INTRAMUSCULAR | 5 refills | Status: DC
  Filled 2022-01-13: qty 10, 33d supply, fill #0

## 2022-01-14 ENCOUNTER — Telehealth: Payer: Self-pay

## 2022-01-14 ENCOUNTER — Other Ambulatory Visit (HOSPITAL_BASED_OUTPATIENT_CLINIC_OR_DEPARTMENT_OTHER): Payer: Self-pay

## 2022-01-14 NOTE — Telephone Encounter (Signed)
Per Dr. Kelton Pillar  [facility] pharmacy advised to dispense the Lyumjev to patient to use in the pump.  ?

## 2022-01-15 ENCOUNTER — Other Ambulatory Visit (HOSPITAL_BASED_OUTPATIENT_CLINIC_OR_DEPARTMENT_OTHER): Payer: Self-pay

## 2022-01-20 ENCOUNTER — Other Ambulatory Visit (HOSPITAL_BASED_OUTPATIENT_CLINIC_OR_DEPARTMENT_OTHER): Payer: Self-pay

## 2022-01-20 ENCOUNTER — Encounter: Payer: Self-pay | Admitting: Pulmonary Disease

## 2022-01-20 ENCOUNTER — Ambulatory Visit (INDEPENDENT_AMBULATORY_CARE_PROVIDER_SITE_OTHER): Payer: Medicare Other | Admitting: Pulmonary Disease

## 2022-01-20 VITALS — BP 120/68 | HR 61 | Temp 98.3°F | Ht 65.0 in | Wt 134.0 lb

## 2022-01-20 DIAGNOSIS — R0609 Other forms of dyspnea: Secondary | ICD-10-CM

## 2022-01-20 DIAGNOSIS — R053 Chronic cough: Secondary | ICD-10-CM

## 2022-01-20 MED ORDER — STIOLTO RESPIMAT 2.5-2.5 MCG/ACT IN AERS
2.0000 | INHALATION_SPRAY | Freq: Every day | RESPIRATORY_TRACT | 11 refills | Status: AC
  Filled 2022-01-20: qty 4, 30d supply, fill #0
  Filled 2022-03-31: qty 4, 30d supply, fill #1

## 2022-01-20 NOTE — Patient Instructions (Addendum)
Nice to see you again ? ?I refilled the Stiolto - please continue ? ?I am glad it is helping ? ?Return to clinic in 6 months or sooner as needed ?

## 2022-01-20 NOTE — Progress Notes (Signed)
? ?@Patient  ID: Kenneth [name], male    DOB: 03/31/1956, 66 y.o.   MRN: [id number] ? ?Chief Complaint  ?Patient presents with  ? Follow-up  ?  Pt states he is still getting some SOB when he walks. Pt is currently on the Stiolto inhaler and needs a refill for it.   ? ? ?Referring provider: ?Shelda Pal* ? ?HPI:  ? ?66 y.o. man whom we are seeing in follow-up for evaluation of dyspnea exertion.  Most recent PCP note x2 reviewed. ? ?Overall, DOE is better. Started stiolto at last visit. He attributes improvement to this. He is pleased. Tolerating HD well per report. No questions or concern. ? ?HPI at initial visit: ?Patient reports several year history of dyspnea exertion.  Worsened over time.  Not present on flat surfaces or relatively short distances.  Worse on inclines or stairs.  No time of day when things are better or worse.  No position where things are better or worse.  No seasonal environmental factors to get in the family seems better or worse.  No other relieving or exacerbating factors.  States he used inhaler for years ago but did not seem to help.  Notably, onset of symptoms correlate with start of hemodialysis. ? ?Reviewed recent chest x-ray 07/2021 to monitor rotation reveals enlarged cardiac silhouette, otherwise clear lungs.  Reviewed most recent echocardiogram 07/2021 that demonstrates grade 2 diastolic function, dilated left atrium, normal RA, RV size and function, normal estimated RVSP.  Reported dry weight is 69 kg's.  Weight today 64 kg's. ? ?PMH: ESRD on HD Monday Wednesday Friday, hypertension, hyperlipidemia, atrial fibrillation, diabetes, CAD ?Surgical history: AV fistula placement, CABG, ?Family history: Father with CAD, colon cancer, mother with diabetes ?Social history: Former smoker, 50+ pack year history, quit in 2014, lives in [place] ? ?Questionaires / Pulmonary Flowsheets:  ? ?ACT:  ?   ? View : No data to display.  ?  ?  ?  ? ? ?MMRC: ?mMRC Dyspnea Scale mMRC Score   ?10/23/2021 ?11:59 AM 0  ?09/11/2021 ? 1:40 PM 1  ? ? ?[place]:  ?   ? View : No data to display.  ?  ?  ?  ? ? ?Tests:  ? ?FENO:  ?Lab Results  ?Component Value Date  ? NITRICOXIDE TEST WILL BE CREDITED 06/09/2016  ? ? ?PFT: ? ?  Latest Ref Rng & Units 10/23/2021  ? 10:48 AM  ?PFT Results  ?FVC-Pre L 3.25    ?FVC-Predicted Pre % 94    ?FVC-Post L 3.31    ?FVC-Predicted Post % 95    ?Pre FEV1/FVC % % 77    ?Post FEV1/FCV % % 77    ?FEV1-Pre L 2.50    ?FEV1-Predicted Pre % 91    ?FEV1-Post L 2.54    ?DLCO uncorrected ml/min/mmHg 11.29    ?DLCO UNC% % 44    ?DLCO corrected ml/min/mmHg 11.29    ?DLCO COR %Predicted % 44    ?DLVA Predicted % 53    ?TLC L 5.91    ?TLC % Predicted % 98    ?RV % Predicted % 114    ?Personally reviewed and interpreted as normal spirometry, no bronchodilator spots, lung volumes within normal limits, DLCO severely reduced but likely corrects to near normal when corrected for hemoglobin as he has history of anemia most recent hemoglobin 8 ? ?WALK:  ?   ? View : No data to display.  ?  ?  ?  ? ? ?Imaging: ?  Personally reviewed and as per EMR discussion of this note ?No results found. ? ?Lab Results: ?Personally reviewed ?CBC ?   ?Component Value Date/Time  ? WBC 5.0 07/30/2021 0048  ? RBC 2.82 (L) 07/30/2021 0048  ? HGB 8.4 (L) 07/30/2021 0048  ? HCT 27.1 (L) 07/30/2021 0048  ? PLT 173 07/30/2021 0048  ? MCV 96.1 07/30/2021 0048  ? MCH 29.8 07/30/2021 0048  ? MCHC 31.0 07/30/2021 0048  ? RDW 18.6 (H) 07/30/2021 0048  ? LYMPHSABS 1.0 07/28/2021 0724  ? MONOABS 0.6 07/28/2021 0724  ? EOSABS 0.1 07/28/2021 0724  ? BASOSABS 0.1 07/28/2021 0724  ? ? ?BMET ?   ?Component Value Date/Time  ? NA 136 07/30/2021 0048  ? K 4.7 07/30/2021 0048  ? CL 97 (L) 07/30/2021 0048  ? CO2 28 07/30/2021 0048  ? GLUCOSE 159 (H) 07/30/2021 0048  ? BUN 34 (H) 07/30/2021 0048  ? CREATININE 7.61 (H) 07/30/2021 0048  ? CALCIUM 8.3 (L) 07/30/2021 0048  ? GFRNONAA 7 (L) 07/30/2021 0048  ? GFRAA 8 (L) 12/12/2019 1010  ? ? ?BNP ?    ?Component Value Date/Time  ? BNP 2,160.6 (H) 07/28/2021 1944  ? ? ?ProBNP ?No results found for: PROBNP ? ?Specialty Problems   ? ?  ? Pulmonary Problems  ? Hemoptysis  ? ? ?No Known Allergies ? ?Immunization History  ?Administered Date(s) Administered  ? Hepatitis B, adult 12/11/2016, 01/08/2017, 02/08/2017, 06/11/2017  ? Hepb-cpg 07/31/2020, 08/28/2020, 10/02/2020, 01/29/2021  ? Influenza, Seasonal, Injecte, Preservative Fre 05/29/2017  ? Influenza,inj,Quad PF,6+ Mos 06/14/2019, 06/14/2019, 06/14/2020, 06/11/2021  ? Influenza,inj,quad, With Preservative 06/08/2018  ? PFIZER(Purple Top)SARS-COV-2 Vaccination 11/16/2019, 12/12/2019, 05/31/2020  ? Pneumococcal Conjugate-13 12/03/2017, 08/16/2019  ? Pneumococcal Polysaccharide-23 08/05/2016, 12/03/2017  ? Tdap 11/04/2021  ? ? ?Past Medical History:  ?Diagnosis Date  ? CAD (coronary artery disease) 10/30/2016  ? NSTEMI 9/17 with CABG x 3 (LIMA-LAD, SVG-OM, SVG-RCA).   - NSTEMI 10/18 s/p DES to ostial SVG to  OM  ? CHF (congestive heart failure) ([place])   ? Depression   ? Diabetic foot ulcer ([place]) 04/11/2017  ? 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 type 1 DM  ? ESRD on hemodialysis ([place])   ? "MWF; [facility]" (03/30/2018)  ? Gastroesophageal reflux disease 08/18/2016  ? Hyperlipidemia   ? Hypertension   ? Ischemic rest pain of lower extremity 02/06/2015  ? Keratoma 02/27/2015  ? Metatarsal deformity 02/27/2015  ? PAF (paroxysmal atrial fibrillation) ([place])   ? Pronation deformity of both feet 02/27/2015  ? ? ?Tobacco History: ?Social History  ? ?Tobacco Use  ?Smoking Status Former  ? Packs/day: 1.50  ? Years: 35.00  ? Pack years: 52.50  ? Types: Cigarettes  ? Quit date: 2014  ? Years since quitting: 9.3  ?Smokeless Tobacco Never  ?Tobacco Comments  ? "quit e-cigs ~ 2014"  ? ?Counseling given: Not Answered ?Tobacco comments: "quit e-cigs ~ 2014" ? ? ?Continue to not smoke ? ?Outpatient Encounter Medications as of 01/20/2022  ?Medication Sig  ? apixaban (ELIQUIS) 5 MG  TABS tablet Take 1 tablet by mouth 2 times daily  ? atorvastatin (LIPITOR) 80 MG tablet Take 1 tablet (80 mg total) by mouth daily.  ? calcium acetate (PHOSLO) 667 MG capsule Take 667 mg by mouth 3 (three) times daily with meals.   ? carvedilol (COREG) 6.25 MG tablet Take 1 tablet (6.25 mg total) by mouth 2 (two) times daily with a meal. Hold on the mornings of dialysis.  ? Continuous  Blood Gluc Sensor (FREESTYLE LIBRE 2 SENSOR) MISC 1 Device by Does not apply route every 14 (fourteen) days.  ? diclofenac Sodium (VOLTAREN) 1 % GEL Apply 4 grams topically 4 (four) times daily.  ? ELIQUIS 5 MG TABS tablet Take 1 tablet by mouth twice daily  ? ezetimibe (ZETIA) 10 MG tablet Take 1 tablet by mouth daily  ? glucose blood (ONETOUCH VERIO) test strip 1 each by Other route in the morning, at noon, in the evening, and at bedtime. Use as instructed  ? hydrALAZINE (APRESOLINE) 100 MG tablet Take 1 tablet (100 mg total) by mouth 3 (three) times daily. DO NOT TAKE AM AND NOON DOSES ON HD DAYS  ? insulin aspart (NOVOLOG) 100 UNIT/ML injection Use as directed with insulin pump. Max of 30 units per day.  ? insulin glargine, 2 Unit Dial, (TOUJEO MAX SOLOSTAR) 300 UNIT/ML Solostar Pen Inject 15 Units into the skin daily in the afternoon.  ? Insulin Human (INSULIN PUMP) SOLN Inject 1 each into the skin continuous. NOVOLOG - 20 units  ? Insulin Lispro-aabc (LYUMJEV) 100 UNIT/ML SOLN Max Daily 30 units per insulin pump  ? Insulin Pen Needle (PEN NEEDLES) 32G X 6 MM MISC Use as directed 3 times daily  ? isosorbide mononitrate (IMDUR) 60 MG 24 hr tablet Take 1.5 tablets (90 mg total) by mouth daily. Hold on days of dialysis on Mon, Wed, Fri.  ? Methoxy PEG-Epoetin Beta (MIRCERA IJ) Mircera  ? pantoprazole (PROTONIX) 40 MG tablet Take 1 tablet (40 mg total) by mouth 2 (two) times daily.  ? sertraline (ZOLOFT) 50 MG tablet Take 1 tablet (50 mg total) by mouth daily.  ? Sodium Zirconium Cyclosilicate (LOKELMA PO) Take 1 packet by mouth 4  (four) times a week. Hold on dialysis days  ? [DISCONTINUED] Tiotropium Bromide-Olodaterol (STIOLTO RESPIMAT) 2.5-2.5 MCG/ACT AERS Inhale 2 puffs into the lungs daily.  ? Tiotropium Bromide-Olodaterol (STIOLTO

## 2022-01-22 ENCOUNTER — Other Ambulatory Visit (HOSPITAL_BASED_OUTPATIENT_CLINIC_OR_DEPARTMENT_OTHER): Payer: Self-pay

## 2022-01-27 ENCOUNTER — Encounter (HOSPITAL_COMMUNITY): Payer: Self-pay | Admitting: Cardiology

## 2022-01-27 ENCOUNTER — Ambulatory Visit (HOSPITAL_COMMUNITY)
Admission: RE | Admit: 2022-01-27 | Discharge: 2022-01-27 | Disposition: A | Discharge status: Home / Self Care | Payer: Medicare Other | Source: Ambulatory Visit | Attending: Cardiology | Admitting: Cardiology

## 2022-01-27 VITALS — BP 140/60 | HR 63 | Wt 138.0 lb

## 2022-01-27 DIAGNOSIS — D649 Anemia, unspecified: Secondary | ICD-10-CM | POA: Insufficient documentation

## 2022-01-27 DIAGNOSIS — Z955 Presence of coronary angioplasty implant and graft: Secondary | ICD-10-CM | POA: Diagnosis not present

## 2022-01-27 DIAGNOSIS — I5042 Chronic combined systolic (congestive) and diastolic (congestive) heart failure: Secondary | ICD-10-CM | POA: Diagnosis present

## 2022-01-27 DIAGNOSIS — G40909 Epilepsy, unspecified, not intractable, without status epilepticus: Secondary | ICD-10-CM | POA: Insufficient documentation

## 2022-01-27 DIAGNOSIS — I5032 Chronic diastolic (congestive) heart failure: Secondary | ICD-10-CM | POA: Diagnosis not present

## 2022-01-27 DIAGNOSIS — E1122 Type 2 diabetes mellitus with diabetic chronic kidney disease: Secondary | ICD-10-CM | POA: Diagnosis not present

## 2022-01-27 DIAGNOSIS — Z7901 Long term (current) use of anticoagulants: Secondary | ICD-10-CM | POA: Insufficient documentation

## 2022-01-27 DIAGNOSIS — I2510 Atherosclerotic heart disease of native coronary artery without angina pectoris: Secondary | ICD-10-CM | POA: Diagnosis not present

## 2022-01-27 DIAGNOSIS — Z992 Dependence on renal dialysis: Secondary | ICD-10-CM | POA: Insufficient documentation

## 2022-01-27 DIAGNOSIS — E785 Hyperlipidemia, unspecified: Secondary | ICD-10-CM | POA: Diagnosis not present

## 2022-01-27 DIAGNOSIS — J439 Emphysema, unspecified: Secondary | ICD-10-CM | POA: Insufficient documentation

## 2022-01-27 DIAGNOSIS — Z79899 Other long term (current) drug therapy: Secondary | ICD-10-CM | POA: Diagnosis not present

## 2022-01-27 DIAGNOSIS — Z794 Long term (current) use of insulin: Secondary | ICD-10-CM | POA: Insufficient documentation

## 2022-01-27 DIAGNOSIS — M545 Low back pain, unspecified: Secondary | ICD-10-CM | POA: Insufficient documentation

## 2022-01-27 DIAGNOSIS — I252 Old myocardial infarction: Secondary | ICD-10-CM | POA: Insufficient documentation

## 2022-01-27 DIAGNOSIS — E1140 Type 2 diabetes mellitus with diabetic neuropathy, unspecified: Secondary | ICD-10-CM | POA: Insufficient documentation

## 2022-01-27 DIAGNOSIS — I480 Paroxysmal atrial fibrillation: Secondary | ICD-10-CM | POA: Diagnosis not present

## 2022-01-27 DIAGNOSIS — I132 Hypertensive heart and chronic kidney disease with heart failure and with stage 5 chronic kidney disease, or end stage renal disease: Secondary | ICD-10-CM | POA: Insufficient documentation

## 2022-01-27 DIAGNOSIS — N186 End stage renal disease: Secondary | ICD-10-CM | POA: Diagnosis not present

## 2022-01-27 DIAGNOSIS — I25810 Atherosclerosis of coronary artery bypass graft(s) without angina pectoris: Secondary | ICD-10-CM | POA: Insufficient documentation

## 2022-01-27 DIAGNOSIS — G8929 Other chronic pain: Secondary | ICD-10-CM | POA: Diagnosis not present

## 2022-01-27 NOTE — Progress Notes (Signed)
PCP: Dr. Tasia Catchings [facility]) ?Cardiology: Dr. Aundra Dubin ? ?66 y.o. 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  He was admitted in 9/17 with NSTEMI.  LHC showed 3VD and he had CABG x 3.  He had CKD stage IV when he was admitted and developed ESRD requiring HD while in the hospital.  He had transient atrial fibrillation post-CABG.  He developed cardiogenic shock requiring milrinone but was able to taper off.  Echo in 9/17 showed EF 35-40%.   ? ?Admitted 8/1 through 04/16/2017 with malignant hypertension due to medication noncompliance. Continues on HD.  HF M-W-F. Discharge weight 140 pounds.  ? ?Admitted 10/18 with chest pain and pulmonary edema. NSTEMI noted on admit. Echo showed preserved EF but + WMAs. Cath with 95% ostial SVG-OM1 stenosis with TIMI 2 flow in graft.  Patient had DES to SVG-OM1.  ? ?Admitted in 12/18 with atypical chest pain, suspect not cardiac.  ? ?NSTEMI in 7/19 with severe in-stent restenosis in SVG-OM1, had DES x 2 to native OM1.  Echo showed EF 50%.  Atrial fibrillation was noted this admission.  ? ?He has had a chronic pattern of atypical chest pain.  Cardiolite in 3/20 showed EF 54%, small fixed inferior defect with no ischemia.  ? ?He was admitted in 8/20 with severe episode of chest pain.  Hs-TnI was mildly elevated without trend.  Cath was done, showing no target for PCI and stable disease. Echo was done showing EF 50-55% with moderate LVH and normal RV.   ? ?He was admitted in 4/22 with CHF and chest pain, elevated HS-TnI to 1673.  Volume overloaded treated with daily HD in hospital.  He had RHC/LHC done after he had been dialyzed x 2, showing normal filling pressures and unchanged coronary anatomy.  Echo showed EF 55-60%, RV normal.  ? ?Most recent echo 11/22 EF 60-65%, GIIDD, RV normal.  ?  ?Admitted 11/22 after he became unresponsive at HD w/ ? Seizure like activity. K >7.5 (Lokelma had  been previously stopped). Given Lokelma 10 g + calcium gluconate + insulin. Also w/ fluid overload and given IV Lasix. Nephrology consulted and started urgent HD. HF consulted and recommended dry weight be lowered with NYHA III symptoms. Losartan stopped. 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 Discharged home with [facility] PT[facility], weight 136.8 lbs. ? ?He returns today for followup of CHF and CAD.  He was seen by pulmonary for COPD (emphysema on CT chest).  Breathing improved after starting Stiolto.  He has been doing well recently, minimal exertional dyspnea and no chest pain.  BP has been stable with HD.  Weight down 1 lb. No orthopnea/PND.  ? ?Labs (12/18): LDL 64, HDL 31 ?Labs (7/19): LDL 36  ?Labs (2/20): hgb 11.3, LDL 59, HDL 28 ?Labs (2/22): LDL 52, HDL 37 ?Labs (4/22): hgb 9.9 ?Labs (7/22): hgb 10.9 ?Labs (10/22): HDL 46, LDL 52 ?Labs (11/22): K 4.7, hgb 8.4 ? ?PMH: ?1. Type II diabetes ?2. HTN ?3. ESRD ?4. CAD: NSTEMI 9/17 with CABG x 3 (LIMA-LAD, SVG-OM, SVG-RCA).   ?- NSTEMI 10/18 s/p DES to ostial SVG-OM ?- NSTEMI 7/19: severe in-stent restenosis in SVG-ramus, chronic total occlusion of SVG-RCA.  DES x 2 to native ramus.  ?- Cardiolite (3/20): EF 54%, small fixed inferior defect with no ischemia.  ?- LHC (8/20): 90% pLAD, subtotal occlusion mLAD, LIMA-LAD patent, occluded SVG-high OM1, 40-50% in-stent restenosis  in native OM1, 80% ostial small AV LCx (not target for intervention), occluded RCA, occluded SVG-RCA (chronic with left=>right collaterals).  No target for intervention.  ?- LHC (4/22): 90% pLAD, subtotal occlusion mLAD, LIMA-LAD patent, occluded SVG-high OM1, 50% in-stent restenosis in native OM1, 85% ostial small AV LCx (not target for intervention), occluded RCA, occluded SVG-RCA (chronic with left=>right collaterals).  No target for intervention.  ?5. Chronic systolic CHF: Ischemic cardiomyopathy.   ?- Echo (9/17) with EF 35-40%, grade II diastolic dysfunction.  ?- Echo (1/18)  with EF 60-65%, grade II diastolic dysfunction. ?- Echo (10/18) with EF 55%, Akinesis of the basal-midinferolateral, inferior, and inferoseptal myocardiam. Akinesis of the basal anteroseptal myocardium.  ?- Echo (7/19): EF 50%, moderate LVH, mildly decreased RV systolic function.  ?- Echo (8/20): EF 50-55%, moderate LVH, normal RV ?- Echo (4/22): EF 55-60%, normal RV ?- Echo (11/22): EF 60-65%, RV normal. ?6. Atrial fibrillation: Paroxysmal.  Only noted post-op CABG.  ?7. ABIs normal in 12/17. 5/18 ABIs normal.  1/19 ABIs normal. 4/20 ABIs normal.  ?8. Carotid stenosis: Carotid dopplers (12/18) with < 50% right common carotid stenosis, 5-64% LICA stenosis.  ?- Carotid dopplers (1/20): < 50% BICA stenosis.  ?- Carotid dopplers (5/21): Mild bilateral disease.  ?9. Atrial fibrillation: Paroxysmal.  ?10. 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 Sciatica ?12. Partial seizures ?13. ABIs (12/22): normal ?14. COPD: Prior smoker.  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 ?SH: Married, lives in [place], 1 son, prior smoker, no ETOH, speaks only [place].  ? ?FH: No premature CAD. ? ?Review of systems complete and found to be negative unless listed in HPI.   ? ?Current Outpatient Medications  ?Medication Sig Dispense Refill  ? apixaban (ELIQUIS) 5 MG TABS tablet Take 1 tablet by mouth 2 times daily 60 tablet 11  ? atorvastatin (LIPITOR) 80 MG tablet Take 1 tablet (80 mg total) by mouth daily. 90 tablet 3  ? calcium acetate (PHOSLO) 667 MG capsule Take 667 mg by mouth 3 (three) times daily with meals.     ? carvedilol (COREG) 6.25 MG tablet Take 1 tablet (6.25 mg total) by mouth 2 (two) times daily with a meal. Hold on the mornings of dialysis. 60 tablet 3  ? Continuous Blood Gluc Sensor (FREESTYLE LIBRE 2 SENSOR) MISC 1 Device by Does not apply route every 14 (fourteen) days. 6 each 3  ? diclofenac Sodium (VOLTAREN) 1 % GEL Apply 4 grams topically 4 (four) times daily. 100 g 2  ?  ezetimibe (ZETIA) 10 MG tablet Take 1 tablet by mouth daily 90 tablet 3  ? glucose blood (ONETOUCH VERIO) test strip 1 each by Other route in the morning, at noon, in the evening, and at bedtime. Use as instructed 400 each 2  ? hydrALAZINE (APRESOLINE) 100 MG tablet Take 1 tablet (100 mg total) by mouth 3 (three) times daily. DO NOT TAKE AM AND NOON DOSES ON HD DAYS 270 tablet 2  ? insulin aspart (NOVOLOG) 100 UNIT/ML injection Use as directed with insulin pump. Max of 30 units per day. 10 mL 5  ? insulin glargine, 2 Unit Dial, (TOUJEO MAX SOLOSTAR) 300 UNIT/ML Solostar Pen Inject 15 Units into the skin daily in the afternoon. 15 mL 1  ? Insulin Human (INSULIN PUMP) SOLN Inject 1 each into the skin continuous. NOVOLOG - 20 units    ? Insulin Lispro-aabc (LYUMJEV) 100 UNIT/ML SOLN Max Daily 30 units per insulin pump 30  mL 6  ? Insulin Pen Needle (PEN NEEDLES) 32G X 6 MM MISC Use as directed 3 times daily 300 each 1  ? isosorbide mononitrate (IMDUR) 60 MG 24 hr tablet Take 1.5 tablets (90 mg total) by mouth daily. Hold on days of dialysis on Mon, Wed, Fri. 45 tablet 3  ? Methoxy PEG-Epoetin Beta (MIRCERA IJ) Mircera    ? pantoprazole (PROTONIX) 40 MG tablet Take 1 tablet (40 mg total) by mouth 2 (two) times daily. 180 tablet 1  ? Sodium Zirconium Cyclosilicate (LOKELMA PO) Take 1 packet by mouth 4 (four) times a week. Hold on dialysis days    ? Tiotropium Bromide-Olodaterol (STIOLTO RESPIMAT) 2.5-2.5 MCG/ACT AERS Inhale 2 puffs by mouth into the lungs daily. 4 g 11  ? sertraline (ZOLOFT) 50 MG tablet Take 1 tablet (50 mg total) by mouth daily. (Patient not taking: Reported on 01/27/2022) 90 tablet 2  ? ?No current facility-administered medications for this encounter.  ? ?BP 140/60   Pulse 63   Wt 62.6 kg (138 lb)   SpO2 97%   BMI 22.96 kg/m?  ? ?Wt Readings from Last 3 Encounters:  ?01/27/22 62.6 kg (138 lb)  ?01/20/22 60.8 kg (134 lb)  ?01/08/22 62.1 kg (137 lb)  ? ?General: NAD ?Neck: No JVD, no thyromegaly or  thyroid nodule.  ?Lungs: Clear to auscultation bilaterally with normal respiratory effort. ?CV: Nondisplaced PMI.  Heart regular S1/S2, no S3/S4, no murmur.  No peripheral edema.  No carotid bruit.  Normal pedal pulses.

## 2022-01-27 NOTE — Patient Instructions (Signed)
It was great to see you today! ?No medication changes are needed at this time. ? ?Your physician recommends that you schedule a follow-up appointment in: 6 months  in the Advanced Practitioners (PA/NP) Clinic  ? ? ?Do the following things EVERYDAY: ?Weigh yourself in the morning before breakfast. Write it down and keep it in a log. ?Take your medicines as prescribed ?Eat low salt foods--Limit salt (sodium) to 2000 mg per day.  ?Stay as active as you can everyday ?Limit all fluids for the day to less than 2 liters ? ?At the [facility], you and your health needs are our priority. As part of our continuing mission to provide you with exceptional heart care, we have created designated Provider Care Teams. These Care Teams include your primary Cardiologist (physician) and Advanced Practice Providers (APPs- Physician Assistants and Nurse Practitioners) who all work together to provide you with the care you need, when you need it.  ? ?You may see any of the following providers on your designated Care Team at your next follow up: ?Dr Glori Bickers ?Dr Loralie Champagne ?Darrick Grinder, NP ?Lyda Jester, PA ?Jessica Milford,NP ?Marlyce Huge, PA ?Audry Riles, PharmD ? ? ?Please be sure to bring in all your medications bottles to every appointment.  ? ?If you have any questions or concerns before your next appointment please send [place] a message through [place] or call our office at [phone].   ? ?TO LEAVE A MESSAGE FOR THE NURSE SELECT OPTION 2, PLEASE LEAVE A MESSAGE INCLUDING: ?YOUR NAME ?DATE OF BIRTH ?CALL BACK NUMBER ?REASON FOR CALL**this is important as we prioritize the call backs ? ?YOU WILL RECEIVE A CALL BACK THE SAME DAY AS LONG AS YOU CALL BEFORE 4:00 PM ? ? ?

## 2022-01-29 ENCOUNTER — Encounter: Payer: Self-pay | Admitting: Internal Medicine

## 2022-01-29 ENCOUNTER — Ambulatory Visit (INDEPENDENT_AMBULATORY_CARE_PROVIDER_SITE_OTHER): Payer: Medicare Other | Admitting: Internal Medicine

## 2022-01-29 VITALS — BP 110/70 | HR 70 | Ht 65.0 in | Wt 135.8 lb

## 2022-01-29 DIAGNOSIS — E1122 Type 2 diabetes mellitus with diabetic chronic kidney disease: Secondary | ICD-10-CM

## 2022-01-29 DIAGNOSIS — N186 End stage renal disease: Secondary | ICD-10-CM | POA: Diagnosis not present

## 2022-01-29 DIAGNOSIS — R739 Hyperglycemia, unspecified: Secondary | ICD-10-CM

## 2022-01-29 DIAGNOSIS — Z794 Long term (current) use of insulin: Secondary | ICD-10-CM

## 2022-01-29 LAB — POCT GLYCOSYLATED HEMOGLOBIN (HGB A1C): Hemoglobin A1C: 9 % — AB (ref 4.0–5.6)

## 2022-01-29 NOTE — Patient Instructions (Addendum)
  Pump   Medtronic 630 G Settings   Insulin type   Lyumjev   Basal rate       0000 0.425 u/h    0600 0.475 u/h    1800 0.525  u/h       I:C ratio       0000 1:10                  Sensitivity       0000  55      Goal       0000  110        Check ou the T-slim/Tandem pump     HOW TO TREAT LOW BLOOD SUGARS (Blood sugar LESS THAN 70 MG/DL) Please follow the RULE OF 15 for the treatment of hypoglycemia treatment (when your (blood sugars are less than 70 mg/dL)   STEP 1: Take 15 grams of carbohydrates when your blood sugar is low, which includes:  3-4 GLUCOSE TABS  OR 3-4 OZ OF JUICE OR REGULAR SODA OR ONE TUBE OF GLUCOSE GEL    STEP 2: RECHECK blood sugar in 15 MINUTES STEP 3: If your blood sugar is still low at the 15 minute recheck --> then, go back to STEP 1 and treat AGAIN with another 15 grams of carbohydrates.

## 2022-01-29 NOTE — Progress Notes (Signed)
Name: Kenneth [name]  MRN/ DOB: [id number], August 16, 1956   Age/ Sex: 66 y.o., male    PCP: Shelda Pal, DO   Reason for Endocrinology Evaluation: Type 2 Diabetes Mellitus     Date of Initial Endocrinology Visit: 10/28/2021    PATIENT IDENTIFIER: [name] is a 66 y.o. male with a past medical history of T2DM, CAD, CHF , HTN and ESRD on HD since 2017. The patient presented for initial endocrinology clinic visit on 10/28/2021 for consultative assistance with his diabetes management.    HPI: Kenneth [name] transitioned care from Dr. Hartford Poli . He is accompanied by his wife and interpreter    Diagnosed with DM years ago  > 20 yrs ago . Islet cell Ab negative 2021 Currently checking blood sugars multiple  x / day,  through freestyle libre   Hypoglycemia episodes : yes                Symptoms: shaking                  Frequency: 1/ week Hemoglobin A1c has ranged from 5.2% in 2022, peaking at 8.1% in 2021.   In terms of diet, the patient eats 2 meals a day. Snacks occasionally     Nephrology : Dr. Moshe Cipro . Pt on HD  Monday, Wednesday and Friday  Pulmonary : Dr. Silas Flood  10/2021 for dyspnea     SUBJECTIVE:   During the last visit (10/28/2021): A1c 5.2% with continued insulin pump settings, he was referred to our CDE for proper pump training and he was not using insulin pump correctly which was causing hypoglycemia but he did not make it to that appointment  Today (01/29/22): [name] is here for follow-up on diabetes management.  He is accompanied by his wife . Interpreter line was used .  He  checks his blood sugars multiple times daily, through CGM. The patient has  had hypoglycemic episodes since the last clinic visit, which typically occur 3 x /month - most often occuring after a bolus  . The patient is symptomatic with these episodes.  He was seen for pretransplant evaluation on 12/10/2021 [facility] kidney transplant, Gaspar Cola Denies nausea, vomiting or diarrhea  He met  with our RD Mickel Baas for [facility] training on 01/08/2022  He is having issues with needle/ cannula  bending    This patient with type 2 diabetes is treated with Medtronic  (insulin pump). During the visit the pump basal and bolus doses were reviewed including carb/insulin rations and supplemental doses. The clinical list was updated. The glucose meter download was reviewed in detail to determine if the current pump settings are providing the best glycemic control without excessive hypoglycemia.  Pump and meter download:    Pump   Medtronic 630 G Settings   Insulin type   Lyumjev   Basal rate       0000 0.400  u/h    0600 0.45 u/h    1800 0.50 u/h       I:C ratio       0000 1:12                  Sensitivity       0000  50      Goal       0000  110            Type & Model of Pump: medtronic  Insulin Type: Currently using novolog .   PUMP  STATISTICS: Average BG: 218+/- 117 BG Readings: 4.2/ day Average Daily Carbs (g): 76 +/-104 Average Total Daily Insulin: 29.3 +/- 15.9 Average Daily Basal: 10.8(37 %) Average Daily Bolus:18.5 (63 %)     CONTINUOUS GLUCOSE MONITORING RECORD INTERPRETATION    Dates of Recording: 5/5-5/18/2023  Sensor description:freestyle libre   Results statistics:   CGM use % of time 87  Average and SD 241/45.3  Time in range  27%  % Time Above 180 35  % Time above 250 36  % Time Below target 2    Glycemic patterns summary:Hyperglycemia during the day and night   Hyperglycemic episodes  postprandial   Hypoglycemic episodes occurred after a bolus  Overnight periods: variable    HOME DIABETES REGIMEN: Lyumjev      Statin: yes  ACE-I/ARB: no      DIABETIC COMPLICATIONS: Microvascular complications:  ESRD on HD , neuropathy Denies: retinopathy  Last eye exam: Completed   Macrovascular complications:  CAD ( S/P CABG 2017) Denies: PVD, CVA   PAST HISTORY: Past Medical History:  Past Medical History:  Diagnosis  Date   CAD (coronary artery disease) 10/30/2016   NSTEMI 9/17 with CABG x 3 (LIMA-LAD, SVG-OM, SVG-RCA).   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   type 1 DM   ESRD on hemodialysis ([place])    "MWF; [place]" (03/30/2018)   Gastroesophageal reflux disease 08/18/2016   Hyperlipidemia    Hypertension    Ischemic rest pain of lower extremity 02/06/2015   Keratoma 02/27/2015   Metatarsal deformity 02/27/2015   PAF (paroxysmal atrial fibrillation) (H[facility])    Pronation deformity of both feet 02/27/2015   Past Surgical History:  Past Surgical History:  Procedure Laterality Date   A/V FISTULAGRAM Left 03/06/2021   Procedure: A/V FISTULAGRAM;  Surgeon: Marty Heck, MD;  Location: [place] [facility];  Service: Cardiovascular;  Laterality: Left;   AV FISTULA PLACEMENT Left 06/22/2016   Procedure: ARTERIOVENOUS (AV) FISTULA CREATION LEFT UPPER ARM;  Surgeon: Rosetta Posner, MD;  Location: [place];  Service: Vascular;  Laterality: Left;   BIOPSY  12/19/2020   Procedure: BIOPSY;  Surgeon: Ladene Artist, MD;  Location: [place];  Service: Endoscopy;;   CARDIAC CATHETERIZATION N/A 05/31/2016   Procedure: Left Heart Cath and Coronary Angiography;  Surgeon: Jettie Booze, MD;  Location: [place] [facility];  Service: Cardiovascular;  Laterality: N/A;   CARDIAC CATHETERIZATION N/A 05/31/2016   Procedure: Right Heart Cath;  Surgeon: Jettie Booze, MD;  Location: [place] [facility];  Service: Cardiovascular;  Laterality: N/A;   CARDIAC CATHETERIZATION N/A 05/31/2016   Procedure: IABP Insertion;  Surgeon: Jettie Booze, MD;  Location: [place] [facility];  Service: Cardiovascular;  Laterality: N/A;   COLONOSCOPY WITH PROPOFOL N/A 12/20/2020   Procedure: COLONOSCOPY WITH PROPOFOL;  Surgeon: Mauri Pole, MD;  Location: [place] [facility];  Service:  Endoscopy;  Laterality: N/A;   CORONARY ARTERY BYPASS GRAFT N/A 06/05/2016   Procedure: CORONARY ARTERY BYPASS GRAFTING (CABG) x3 LIMA to LAD -SVG to OM -SVG to RCA;  Surgeon: Ivin Poot, MD;  Location: [place];  Service: Open Heart Surgery;  Laterality: N/A;   CORONARY STENT INTERVENTION N/A 07/07/2017   Procedure: CORONARY STENT INTERVENTION;  Surgeon: Wellington Hampshire, MD;  Location: [place] [facility];  Service: Cardiovascular;  Laterality: N/A;   CORONARY STENT INTERVENTION N/A 03/30/2018   Procedure: CORONARY STENT INTERVENTION;  Surgeon: Martinique, Peter M, MD;  Location: [place] [facility];  Service: Cardiovascular;  Laterality: N/A;   ESOPHAGOGASTRODUODENOSCOPY (EGD) WITH PROPOFOL N/A 12/19/2020   Procedure: ESOPHAGOGASTRODUODENOSCOPY (EGD) WITH PROPOFOL;  Surgeon: Ladene Artist, MD;  Location: [facility];  Service: Endoscopy;  Laterality: N/A;   GIVENS CAPSULE STUDY N/A 12/20/2020   Procedure: GIVENS CAPSULE STUDY;  Surgeon: Mauri Pole, MD;  Location: [place];  Service: Endoscopy;  Laterality: N/A;   INSERTION OF DIALYSIS CATHETER N/A 06/05/2016   Procedure: INSERTION OF DIALYSIS/trialysis CATHETER;  Surgeon: Ivin Poot, MD;  Location: [place];  Service: Vascular;  Laterality: N/A;   INSERTION OF DIALYSIS CATHETER Right 06/13/2016   Procedure: INSERTION OF DIALYSIS CATHETER RIGHT INTERNAL JUGULAR;  Surgeon: Conrad Bettendorf, MD;  Location: [place];  Service: Vascular;  Laterality: Right;   INSERTION OF DIALYSIS CATHETER Right 09/07/2019   Procedure: Insertion Of Dialysis Catheter;  Surgeon: Rosetta Posner, MD;  Location: [facility] OR;  Service: Vascular;  Laterality: Right;   INTRAOPERATIVE TRANSESOPHAGEAL ECHOCARDIOGRAM N/A 06/05/2016   Procedure: INTRAOPERATIVE TRANSESOPHAGEAL ECHOCARDIOGRAM;  Surgeon: Ivin Poot, MD;  Location: [place];  Service: Open Heart Surgery;  Laterality: N/A;   LEFT HEART CATH AND CORS/GRAFTS ANGIOGRAPHY N/A 11/03/2016   Procedure: Left Heart Cath and  Cors/Grafts Angiography;  Surgeon: Troy Sine, MD;  Location: [place] [facility];  Service: Cardiovascular;  Laterality: N/A;   LEFT HEART CATH AND CORS/GRAFTS ANGIOGRAPHY N/A 07/07/2017   Procedure: LEFT HEART CATH AND CORS/GRAFTS ANGIOGRAPHY;  Surgeon: Larey Dresser, MD;  Location: [place] [facility];  Service: Cardiovascular;  Laterality: N/A;   LEFT HEART CATH AND CORS/GRAFTS ANGIOGRAPHY N/A 03/30/2018   Procedure: LEFT HEART CATH AND CORS/GRAFTS ANGIOGRAPHY;  Surgeon: Martinique, Peter M, MD;  Location: [place] [facility];  Service: Cardiovascular;  Laterality: N/A;   LEFT HEART CATH AND CORS/GRAFTS ANGIOGRAPHY N/A 04/17/2019   Procedure: LEFT HEART CATH AND CORS/GRAFTS ANGIOGRAPHY;  Surgeon: Larey Dresser, MD;  Location: [place] [facility];  Service: Cardiovascular;  Laterality: N/A;   PERIPHERAL VASCULAR INTERVENTION Left 03/06/2021   Procedure: PERIPHERAL VASCULAR INTERVENTION;  Surgeon: Marty Heck, MD;  Location: [place] [facility];  Service: Cardiovascular;  Laterality: Left;   POLYPECTOMY  12/20/2020   Procedure: POLYPECTOMY;  Surgeon: Mauri Pole, MD;  Location: [place];  Service: Endoscopy;;   REVISION OF ARTERIOVENOUS GORETEX GRAFT Left 14/78/2956   Procedure: PLICATION OF ANEURYSM OF ARTERIOVENOUS FISTULA  LEFT ARM;  Surgeon: Rosetta Posner, MD;  Location: [place];  Service: Vascular;  Laterality: Left;   REVISON OF ARTERIOVENOUS FISTULA Left 09/07/2019   Procedure: REVISON OF LEFT UPPER ARM ARTERIOVENOUS FISTULA;  Surgeon: Rosetta Posner, MD;  Location: [place];  Service: Vascular;  Laterality: Left;   RIGHT/LEFT HEART CATH AND CORONARY ANGIOGRAPHY N/A 01/03/2021   Procedure: RIGHT/LEFT HEART CATH AND CORONARY ANGIOGRAPHY;  Surgeon: Larey Dresser, MD;  Location: [place] [facility];  Service: Cardiovascular;  Laterality: N/A;    Social History:  reports that he quit smoking about 9 years ago. His smoking use included cigarettes. He has a 52.50 pack-year smoking  history. He has never used smokeless tobacco. He reports that he does not drink alcohol and does not use drugs. Family History:  Family History  Problem Relation Age of Onset   CAD Father    Colon cancer  Father    Diabetes Brother      HOME MEDICATIONS: Allergies as of 01/29/2022   No Known Allergies      Medication List        Accurate as of Jan 29, 2022  7:29 AM. If you have any questions, ask your nurse or doctor.          atorvastatin 80 MG tablet Commonly known as: LIPITOR Take 1 tablet (80 mg total) by mouth daily.   calcium acetate 667 MG capsule Commonly known as: PHOSLO Take 667 mg by mouth 3 (three) times daily with meals.   carvedilol 6.25 MG tablet Commonly known as: COREG Take 1 tablet (6.25 mg total) by mouth 2 (two) times daily with a meal. Hold on the mornings of dialysis.   diclofenac Sodium 1 % Gel Commonly known as: Voltaren Apply 4 grams topically 4 (four) times daily.   Eliquis 5 MG Tabs tablet Generic drug: apixaban Take 1 tablet by mouth 2 times daily   ezetimibe 10 MG tablet Commonly known as: ZETIA Take 1 tablet by mouth daily   FreeStyle Libre 2 Sensor Misc 1 Device by Does not apply route every 14 (fourteen) days.   hydrALAZINE 100 MG tablet Commonly known as: APRESOLINE Take 1 tablet (100 mg total) by mouth 3 (three) times daily. DO NOT TAKE AM AND NOON DOSES ON HD DAYS   insulin aspart 100 UNIT/ML injection Commonly known as: novoLOG Use as directed with insulin pump. Max of 30 units per day.   insulin pump Soln Inject 1 each into the skin continuous. NOVOLOG - 20 units   isosorbide mononitrate 60 MG 24 hr tablet Commonly known as: IMDUR Take 1.5 tablets (90 mg total) by mouth daily. Hold on days of dialysis on Mon, Wed, Fri.   LOKELMA PO Take 1 packet by mouth 4 (four) times a week. Hold on dialysis days   Lyumjev 100 UNIT/ML Soln Generic drug: Insulin Lispro-aabc Max Daily 30 units per insulin pump   MIRCERA  IJ Mircera   OneTouch Verio test strip Generic drug: glucose blood 1 each by Other route in the morning, at noon, in the evening, and at bedtime. Use as instructed   pantoprazole 40 MG tablet Commonly known as: PROTONIX Take 1 tablet (40 mg total) by mouth 2 (two) times daily.   sertraline 50 MG tablet Commonly known as: Zoloft Take 1 tablet (50 mg total) by mouth daily.   Stiolto Respimat 2.5-2.5 MCG/ACT Aers Generic drug: Tiotropium Bromide-Olodaterol Inhale 2 puffs by mouth into the lungs daily.   TechLite Pen Needles 32G X 6 MM Misc Generic drug: Insulin Pen Needle Use as directed 3 times daily   Toujeo Max SoloStar 300 UNIT/ML Solostar Pen Generic drug: insulin glargine (2 Unit Dial) Inject 15 Units into the skin daily in the afternoon.         ALLERGIES: No Known Allergies   REVIEW OF SYSTEMS: A comprehensive ROS was conducted with the patient and is negative except as per HPI     OBJECTIVE:   VITAL SIGNS: BP 110/70 (BP Location: Left Arm, Patient Position: Sitting, Cuff Size: Small)   Pulse 70   Ht _0  (1.651 m)   Wt 135 lb 12.8 oz (61.6 kg)   SpO2 97%   BMI 22.60 kg/m    PHYSICAL EXAM:  General: Pt appears well and is in NAD  Lungs: Clear with good BS bilat with no rales, rhonchi, or wheezes  Heart: RRR with normal S1  and S2 and no gallops; no murmurs; no rub  Abdomen: Normoactive bowel sounds, soft, nontender, without masses or organomegaly palpable  Extremities:  Lower extremities - No pretibial edema. No lesions.  Neuro: MS is good with appropriate affect, pt is alert and Ox3    DM foot exam: 10/28/2021  The skin of the feet is intact without sores or ulcerations. The pedal pulses are 2+ on right and 2+ on left. The sensation is absent to a screening 5.07, 10 gram monofilament bilaterally    DATA REVIEWED:  Lab Results  Component Value Date   HGBA1C 7.1 (A) 10/28/2021   HGBA1C 5.3 07/28/2021   HGBA1C 5.9 (H) 12/16/2020   Lab  Results  Component Value Date   LDLCALC 48 07/10/2021   CREATININE 7.61 (H) 07/30/2021   No results found for: [facility]  Lab Results  Component Value Date   CHOL 100 07/10/2021   HDL 46 07/10/2021   LDLCALC 48 07/10/2021   TRIG 31 07/10/2021   CHOLHDL 2.2 07/10/2021        ASSESSMENT / PLAN / RECOMMENDATIONS:   1) Type 2 Diabetes Mellitus, Poorly controlled, With Neuropathic, ESRD on HD and macrovascular  complications - Most recent A1c of 5.2 %. Goal A1c < 7.0 %.     - A1c skewed due to ESRD  -Barriers to diabetes self-care is language -Patient continues with hyperglycemia, unfortunately he continues to not bolus correctly at times.  He does not always enter carbohydrates and he manually boluses and overrides his pump which results in glycemic excursions.  For example this morning he ate a cereal bar instead of entering CHO he manually bolused with 10 units of insulin, today I walked the patient through entering bolus with initially entering his BG reading followed by CHO -I well increase his basal rate due to hyperglycemia, I am going to adjust his sensitivity factor as he has been noted with hypoglycemia following correction -One Touch Verio glucose meter was provided today -Patient tells me that the insertion site cannula continues to bend and he is having to change the location multiple times.  I am going to refer him back to our CDE so that we can obtain tandem for him -He will return for fasting C-peptide and serum glucose   - MEDICATIONS:   Pump   Medtronic  Settings   Insulin type   Lyumjev   Basal rate       0000 0.425  u/h    0600 0.475 u/h    1800 0.525  u/h       I:C ratio       0000 1:10                  Sensitivity       0000  55      Goal       0000  100-120         EDUCATION / INSTRUCTIONS: BG monitoring instructions: Patient is instructed to check his blood sugars 3 times a day, before meals . Call [place] [facility] if: BG  persistently < 70  I reviewed the Rule of 15 for the treatment of hypoglycemia in detail with the patient. Literature supplied.   2) Diabetic complications:  Eye: Does not have known diabetic retinopathy.  Neuro/ Feet: Does  have known diabetic peripheral neuropathy. Renal: Patient does  have known baseline CKD. He is  on an ACEI/ARB at present.    Signed electronically by: Mack Guise, MD  [facility]  [facility] [address] [place], [place] [address] Phone: [phone] FAX: [phone]   CC: Shelda Pal, [address] [place] [address] Phone: [phone]  Fax: [phone]    Return to Endocrinology clinic as below: Future Appointments  Date Time Provider [place]  01/29/2022 10:50 AM Jolyssa Oplinger, Melanie Crazier, MD [facility] None  03/05/2022 10:45 AM Regal, Tamala Fothergill, DPM [facility]

## 2022-02-05 ENCOUNTER — Telehealth: Payer: Self-pay | Admitting: Family Medicine

## 2022-02-05 NOTE — Telephone Encounter (Signed)
Caller: Melonie [place] Call back: [phone]   Form [id number] is needing to be filled out for patient?  Please advise

## 2022-02-06 NOTE — Telephone Encounter (Signed)
Do you have this form for patient.

## 2022-02-06 NOTE — Telephone Encounter (Signed)
Called the friend Kenneth [name] and told her we do not have this form. It was for Home Care and she will call back to the location and get the form needed for PCP to complete.

## 2022-02-10 ENCOUNTER — Other Ambulatory Visit: Payer: Self-pay | Admitting: Family Medicine

## 2022-02-10 ENCOUNTER — Other Ambulatory Visit (HOSPITAL_BASED_OUTPATIENT_CLINIC_OR_DEPARTMENT_OTHER): Payer: Self-pay

## 2022-02-10 ENCOUNTER — Other Ambulatory Visit: Payer: Self-pay | Admitting: Cardiology

## 2022-02-10 DIAGNOSIS — I10 Essential (primary) hypertension: Secondary | ICD-10-CM

## 2022-02-10 MED ORDER — ISOSORBIDE MONONITRATE ER 60 MG PO TB24
90.0000 mg | ORAL_TABLET | Freq: Every day | ORAL | 5 refills | Status: DC
  Filled 2022-02-10: qty 72, 84d supply, fill #0
  Filled 2022-04-14: qty 72, 84d supply, fill #1
  Filled 2022-07-10: qty 72, 84d supply, fill #2
  Filled 2022-10-11: qty 72, 84d supply, fill #3
  Filled 2023-01-04: qty 72, 84d supply, fill #4

## 2022-02-10 MED ORDER — CARVEDILOL 6.25 MG PO TABS
6.2500 mg | ORAL_TABLET | Freq: Two times a day (BID) | ORAL | 3 refills | Status: DC
  Filled 2022-02-10: qty 60, 30d supply, fill #0
  Filled 2022-03-24: qty 60, 30d supply, fill #1
  Filled 2022-05-07: qty 60, 30d supply, fill #2
  Filled 2022-06-12: qty 60, 30d supply, fill #3

## 2022-02-21 ENCOUNTER — Inpatient Hospital Stay (HOSPITAL_BASED_OUTPATIENT_CLINIC_OR_DEPARTMENT_OTHER)
Admission: EM | Admit: 2022-02-21 | Discharge: 2022-02-23 | DRG: 193 | Disposition: A | Discharge status: Home / Self Care | Payer: Medicare Other | Attending: Family Medicine | Admitting: Family Medicine

## 2022-02-21 ENCOUNTER — Emergency Department (HOSPITAL_BASED_OUTPATIENT_CLINIC_OR_DEPARTMENT_OTHER): Payer: Medicare Other

## 2022-02-21 ENCOUNTER — Encounter (HOSPITAL_COMMUNITY): Payer: Self-pay | Admitting: Internal Medicine

## 2022-02-21 ENCOUNTER — Other Ambulatory Visit: Payer: Self-pay

## 2022-02-21 DIAGNOSIS — J439 Emphysema, unspecified: Secondary | ICD-10-CM | POA: Diagnosis present

## 2022-02-21 DIAGNOSIS — Z20822 Contact with and (suspected) exposure to covid-19: Secondary | ICD-10-CM | POA: Diagnosis present

## 2022-02-21 DIAGNOSIS — E1159 Type 2 diabetes mellitus with other circulatory complications: Secondary | ICD-10-CM | POA: Diagnosis present

## 2022-02-21 DIAGNOSIS — J449 Chronic obstructive pulmonary disease, unspecified: Secondary | ICD-10-CM | POA: Diagnosis present

## 2022-02-21 DIAGNOSIS — Z9641 Presence of insulin pump (external) (internal): Secondary | ICD-10-CM | POA: Diagnosis present

## 2022-02-21 DIAGNOSIS — E1069 Type 1 diabetes mellitus with other specified complication: Secondary | ICD-10-CM | POA: Diagnosis present

## 2022-02-21 DIAGNOSIS — I252 Old myocardial infarction: Secondary | ICD-10-CM | POA: Diagnosis not present

## 2022-02-21 DIAGNOSIS — I132 Hypertensive heart and chronic kidney disease with heart failure and with stage 5 chronic kidney disease, or end stage renal disease: Secondary | ICD-10-CM | POA: Diagnosis present

## 2022-02-21 DIAGNOSIS — J440 Chronic obstructive pulmonary disease with acute lower respiratory infection: Secondary | ICD-10-CM | POA: Diagnosis present

## 2022-02-21 DIAGNOSIS — D631 Anemia in chronic kidney disease: Secondary | ICD-10-CM | POA: Diagnosis present

## 2022-02-21 DIAGNOSIS — Z992 Dependence on renal dialysis: Secondary | ICD-10-CM | POA: Diagnosis not present

## 2022-02-21 DIAGNOSIS — J189 Pneumonia, unspecified organism: Principal | ICD-10-CM

## 2022-02-21 DIAGNOSIS — E1022 Type 1 diabetes mellitus with diabetic chronic kidney disease: Secondary | ICD-10-CM | POA: Diagnosis present

## 2022-02-21 DIAGNOSIS — K219 Gastro-esophageal reflux disease without esophagitis: Secondary | ICD-10-CM | POA: Diagnosis present

## 2022-02-21 DIAGNOSIS — N2581 Secondary hyperparathyroidism of renal origin: Secondary | ICD-10-CM | POA: Diagnosis present

## 2022-02-21 DIAGNOSIS — Z87891 Personal history of nicotine dependence: Secondary | ICD-10-CM

## 2022-02-21 DIAGNOSIS — I25118 Atherosclerotic heart disease of native coronary artery with other forms of angina pectoris: Secondary | ICD-10-CM | POA: Diagnosis present

## 2022-02-21 DIAGNOSIS — Z951 Presence of aortocoronary bypass graft: Secondary | ICD-10-CM | POA: Diagnosis not present

## 2022-02-21 DIAGNOSIS — E785 Hyperlipidemia, unspecified: Secondary | ICD-10-CM | POA: Diagnosis present

## 2022-02-21 DIAGNOSIS — J9601 Acute respiratory failure with hypoxia: Secondary | ICD-10-CM | POA: Diagnosis present

## 2022-02-21 DIAGNOSIS — I248 Other forms of acute ischemic heart disease: Secondary | ICD-10-CM | POA: Diagnosis present

## 2022-02-21 DIAGNOSIS — Z79899 Other long term (current) drug therapy: Secondary | ICD-10-CM | POA: Diagnosis not present

## 2022-02-21 DIAGNOSIS — I152 Hypertension secondary to endocrine disorders: Secondary | ICD-10-CM | POA: Diagnosis present

## 2022-02-21 DIAGNOSIS — R778 Other specified abnormalities of plasma proteins: Secondary | ICD-10-CM

## 2022-02-21 DIAGNOSIS — N186 End stage renal disease: Secondary | ICD-10-CM

## 2022-02-21 DIAGNOSIS — E876 Hypokalemia: Secondary | ICD-10-CM | POA: Diagnosis not present

## 2022-02-21 DIAGNOSIS — D649 Anemia, unspecified: Secondary | ICD-10-CM | POA: Diagnosis present

## 2022-02-21 DIAGNOSIS — Z955 Presence of coronary angioplasty implant and graft: Secondary | ICD-10-CM

## 2022-02-21 DIAGNOSIS — Z7901 Long term (current) use of anticoagulants: Secondary | ICD-10-CM

## 2022-02-21 DIAGNOSIS — I480 Paroxysmal atrial fibrillation: Secondary | ICD-10-CM | POA: Diagnosis present

## 2022-02-21 DIAGNOSIS — F32A Depression, unspecified: Secondary | ICD-10-CM | POA: Diagnosis present

## 2022-02-21 DIAGNOSIS — Y95 Nosocomial condition: Secondary | ICD-10-CM | POA: Diagnosis present

## 2022-02-21 DIAGNOSIS — E1169 Type 2 diabetes mellitus with other specified complication: Secondary | ICD-10-CM | POA: Diagnosis present

## 2022-02-21 DIAGNOSIS — Z794 Long term (current) use of insulin: Secondary | ICD-10-CM

## 2022-02-21 DIAGNOSIS — E1122 Type 2 diabetes mellitus with diabetic chronic kidney disease: Secondary | ICD-10-CM

## 2022-02-21 DIAGNOSIS — I5032 Chronic diastolic (congestive) heart failure: Secondary | ICD-10-CM | POA: Diagnosis present

## 2022-02-21 DIAGNOSIS — D638 Anemia in other chronic diseases classified elsewhere: Secondary | ICD-10-CM | POA: Diagnosis present

## 2022-02-21 LAB — COMPREHENSIVE METABOLIC PANEL
ALT: 13 U/L (ref 0–44)
AST: 23 U/L (ref 15–41)
Albumin: 3.1 g/dL — ABNORMAL LOW (ref 3.5–5.0)
Alkaline Phosphatase: 84 U/L (ref 38–126)
Anion gap: 10 (ref 5–15)
BUN: 23 mg/dL (ref 8–23)
CO2: 31 mmol/L (ref 22–32)
Calcium: 8.3 mg/dL — ABNORMAL LOW (ref 8.9–10.3)
Chloride: 91 mmol/L — ABNORMAL LOW (ref 98–111)
Creatinine, Ser: 5.73 mg/dL — ABNORMAL HIGH (ref 0.61–1.24)
GFR, Estimated: 10 mL/min — ABNORMAL LOW (ref >60)
Glucose, Bld: 252 mg/dL — ABNORMAL HIGH (ref 70–99)
Potassium: 3.2 mmol/L — ABNORMAL LOW (ref 3.5–5.1)
Sodium: 132 mmol/L — ABNORMAL LOW (ref 135–145)
Total Bilirubin: 1.1 mg/dL (ref 0.3–1.2)
Total Protein: 7.3 g/dL (ref 6.5–8.1)

## 2022-02-21 LAB — I-STAT VENOUS BLOOD GAS, ED
Acid-Base Excess: 9 mmol/L — ABNORMAL HIGH (ref 0.0–2.0)
Bicarbonate: 32 mmol/L — ABNORMAL HIGH (ref 20.0–28.0)
Calcium, Ion: 0.92 mmol/L — ABNORMAL LOW (ref 1.15–1.40)
HCT: 26 % — ABNORMAL LOW (ref 39.0–52.0)
Hemoglobin: 8.8 g/dL — ABNORMAL LOW (ref 13.0–17.0)
O2 Saturation: 83 %
Potassium: 3.3 mmol/L — ABNORMAL LOW (ref 3.5–5.1)
Sodium: 131 mmol/L — ABNORMAL LOW (ref 135–145)
TCO2: 33 mmol/L — ABNORMAL HIGH (ref 22–32)
pCO2, Ven: 34.4 mmHg — ABNORMAL LOW (ref 44–60)
pH, Ven: 7.576 — ABNORMAL HIGH (ref 7.25–7.43)
pO2, Ven: 39 mmHg (ref 32–45)

## 2022-02-21 LAB — CBC
HCT: 25.9 % — ABNORMAL LOW (ref 39.0–52.0)
Hemoglobin: 8.8 g/dL — ABNORMAL LOW (ref 13.0–17.0)
MCH: 31.5 pg (ref 26.0–34.0)
MCHC: 34 g/dL (ref 30.0–36.0)
MCV: 92.8 fL (ref 80.0–100.0)
Platelets: 187 10*3/uL (ref 150–400)
RBC: 2.79 MIL/uL — ABNORMAL LOW (ref 4.22–5.81)
RDW: 14.3 % (ref 11.5–15.5)
WBC: 6.7 10*3/uL (ref 4.0–10.5)
nRBC: 0 % (ref 0.0–0.2)

## 2022-02-21 LAB — GLUCOSE, CAPILLARY: Glucose-Capillary: 217 mg/dL — ABNORMAL HIGH (ref 70–99)

## 2022-02-21 LAB — AMMONIA: Ammonia: 14 umol/L (ref 9–35)

## 2022-02-21 LAB — CK: Total CK: 81 U/L (ref 49–397)

## 2022-02-21 LAB — SARS CORONAVIRUS 2 BY RT PCR: SARS Coronavirus 2 by RT PCR: NEGATIVE

## 2022-02-21 LAB — TSH: TSH: 2.813 u[IU]/mL (ref 0.350–4.500)

## 2022-02-21 LAB — LIPASE, BLOOD: Lipase: 32 U/L (ref 11–51)

## 2022-02-21 LAB — TROPONIN I (HIGH SENSITIVITY)
Troponin I (High Sensitivity): 325 ng/L (ref <18)
Troponin I (High Sensitivity): 280 ng/L (ref <18)

## 2022-02-21 LAB — D-DIMER, QUANTITATIVE: D-Dimer, Quant: 0.78 ug/mL-FEU — ABNORMAL HIGH (ref 0.00–0.50)

## 2022-02-21 LAB — LACTIC ACID, PLASMA: Lactic Acid, Venous: 1.2 mmol/L (ref 0.5–1.9)

## 2022-02-21 LAB — CBG MONITORING, ED: Glucose-Capillary: 260 mg/dL — ABNORMAL HIGH (ref 70–99)

## 2022-02-21 MED ORDER — APIXABAN 5 MG PO TABS
5.0000 mg | ORAL_TABLET | Freq: Two times a day (BID) | ORAL | Status: DC
  Administered 2022-02-21 – 2022-02-23 (×4): 5 mg via ORAL
  Filled 2022-02-21 (×4): qty 1

## 2022-02-21 MED ORDER — DOXYCYCLINE HYCLATE 100 MG PO TABS: 100.0000 mg | ORAL_TABLET | Freq: Once | ORAL | Status: DC

## 2022-02-21 MED ORDER — SODIUM CHLORIDE 0.9 % IV SOLN
2.0000 g | INTRAVENOUS | Status: DC
  Administered 2022-02-22: 2 g via INTRAVENOUS
  Filled 2022-02-21: qty 20

## 2022-02-21 MED ORDER — INSULIN PUMP: 1.0000 | SUBCUTANEOUS | Status: DC

## 2022-02-21 MED ORDER — SODIUM CHLORIDE 0.9 % IV SOLN
1.0000 g | Freq: Once | INTRAVENOUS | Status: AC
  Administered 2022-02-21: 1 g via INTRAVENOUS

## 2022-02-21 MED ORDER — ATORVASTATIN CALCIUM 80 MG PO TABS
80.0000 mg | ORAL_TABLET | Freq: Every day | ORAL | Status: DC
  Administered 2022-02-22 – 2022-02-23 (×2): 80 mg via ORAL
  Filled 2022-02-21 (×2): qty 1

## 2022-02-21 MED ORDER — ARFORMOTEROL TARTRATE 15 MCG/2ML IN NEBU
15.0000 ug | INHALATION_SOLUTION | Freq: Two times a day (BID) | RESPIRATORY_TRACT | Status: DC
  Administered 2022-02-22 (×2): 15 ug via RESPIRATORY_TRACT
  Filled 2022-02-21 (×2): qty 2

## 2022-02-21 MED ORDER — EZETIMIBE 10 MG PO TABS
10.0000 mg | ORAL_TABLET | Freq: Every day | ORAL | Status: DC
  Administered 2022-02-21 – 2022-02-23 (×3): 10 mg via ORAL
  Filled 2022-02-21 (×3): qty 1

## 2022-02-21 MED ORDER — PANTOPRAZOLE SODIUM 40 MG IV SOLR
40.0000 mg | Freq: Once | INTRAVENOUS | Status: AC
  Administered 2022-02-21: 40 mg via INTRAVENOUS
  Filled 2022-02-21: qty 10

## 2022-02-21 MED ORDER — ACETAMINOPHEN 650 MG RE SUPP: 650.0000 mg | Freq: Four times a day (QID) | RECTAL | Status: DC | PRN

## 2022-02-21 MED ORDER — SODIUM CHLORIDE 0.9 % IV BOLUS
500.0000 mL | Freq: Once | INTRAVENOUS | Status: AC
  Administered 2022-02-21: 500 mL via INTRAVENOUS

## 2022-02-21 MED ORDER — CALCIUM ACETATE (PHOS BINDER) 667 MG PO CAPS
667.0000 mg | ORAL_CAPSULE | Freq: Three times a day (TID) | ORAL | Status: DC
  Administered 2022-02-22 – 2022-02-23 (×4): 667 mg via ORAL
  Filled 2022-02-21 (×5): qty 1

## 2022-02-21 MED ORDER — VANCOMYCIN HCL IN DEXTROSE 1-5 GM/200ML-% IV SOLN
1000.0000 mg | Freq: Once | INTRAVENOUS | Status: AC
  Administered 2022-02-21: 1000 mg via INTRAVENOUS
  Filled 2022-02-21: qty 200

## 2022-02-21 MED ORDER — SODIUM CHLORIDE 0.9 % IV SOLN
500.0000 mg | Freq: Once | INTRAVENOUS | Status: AC
  Administered 2022-02-21: 500 mg via INTRAVENOUS

## 2022-02-21 MED ORDER — INSULIN PUMP
Freq: Three times a day (TID) | SUBCUTANEOUS | Status: DC
  Administered 2022-02-21 – 2022-02-22 (×3): 1 via SUBCUTANEOUS
  Filled 2022-02-21: qty 1

## 2022-02-21 MED ORDER — UMECLIDINIUM BROMIDE 62.5 MCG/ACT IN AEPB
1.0000 | INHALATION_SPRAY | Freq: Every day | RESPIRATORY_TRACT | Status: DC
  Administered 2022-02-22: 1 via RESPIRATORY_TRACT
  Filled 2022-02-21: qty 7

## 2022-02-21 MED ORDER — SODIUM CHLORIDE 0.9 % IV SOLN: 1.0000 g | Freq: Once | INTRAVENOUS | Status: DC

## 2022-02-21 MED ORDER — PROCHLORPERAZINE EDISYLATE 10 MG/2ML IJ SOLN: 10.0000 mg | Freq: Four times a day (QID) | INTRAMUSCULAR | Status: DC | PRN

## 2022-02-21 MED ORDER — DOXYCYCLINE HYCLATE 100 MG PO TABS
100.0000 mg | ORAL_TABLET | Freq: Two times a day (BID) | ORAL | Status: DC
  Administered 2022-02-21 – 2022-02-23 (×4): 100 mg via ORAL
  Filled 2022-02-21 (×4): qty 1

## 2022-02-21 MED ORDER — PANTOPRAZOLE SODIUM 40 MG PO TBEC
40.0000 mg | DELAYED_RELEASE_TABLET | Freq: Two times a day (BID) | ORAL | Status: DC
  Administered 2022-02-21 – 2022-02-23 (×4): 40 mg via ORAL
  Filled 2022-02-21 (×4): qty 1

## 2022-02-21 MED ORDER — ACETAMINOPHEN 325 MG PO TABS: 650.0000 mg | ORAL_TABLET | Freq: Four times a day (QID) | ORAL | Status: DC | PRN

## 2022-02-21 MED ORDER — PANTOPRAZOLE 80MG IVPB - SIMPLE MED
80.0000 mg | Freq: Once | INTRAVENOUS | Status: DC
  Filled 2022-02-21: qty 100

## 2022-02-21 MED ORDER — VANCOMYCIN HCL 500 MG IV SOLR: 500.0000 mg | INTRAVENOUS | Status: DC

## 2022-02-21 MED ORDER — SODIUM CHLORIDE 0.9% FLUSH
3.0000 mL | Freq: Two times a day (BID) | INTRAVENOUS | Status: DC
  Administered 2022-02-21 – 2022-02-23 (×4): 3 mL via INTRAVENOUS

## 2022-02-21 MED ORDER — HYDRALAZINE HCL 50 MG PO TABS
100.0000 mg | ORAL_TABLET | Freq: Three times a day (TID) | ORAL | Status: DC
  Administered 2022-02-21 – 2022-02-23 (×5): 100 mg via ORAL
  Filled 2022-02-21 (×5): qty 2

## 2022-02-21 MED ORDER — ALBUTEROL SULFATE HFA 108 (90 BASE) MCG/ACT IN AERS: 1.0000 | INHALATION_SPRAY | Freq: Four times a day (QID) | RESPIRATORY_TRACT | Status: DC | PRN

## 2022-02-21 MED ORDER — CARVEDILOL 6.25 MG PO TABS
6.2500 mg | ORAL_TABLET | Freq: Two times a day (BID) | ORAL | Status: DC
  Administered 2022-02-21 – 2022-02-22 (×3): 6.25 mg via ORAL
  Filled 2022-02-21 (×4): qty 1

## 2022-02-21 MED ORDER — ALBUTEROL SULFATE (2.5 MG/3ML) 0.083% IN NEBU: 2.5000 mg | INHALATION_SOLUTION | Freq: Four times a day (QID) | RESPIRATORY_TRACT | Status: DC | PRN

## 2022-02-21 MED ORDER — IOHEXOL 350 MG/ML SOLN
100.0000 mL | Freq: Once | INTRAVENOUS | Status: AC | PRN
  Administered 2022-02-21: 100 mL via INTRAVENOUS

## 2022-02-21 MED ORDER — ISOSORBIDE MONONITRATE ER 60 MG PO TB24
90.0000 mg | ORAL_TABLET | Freq: Every day | ORAL | Status: DC
  Administered 2022-02-22 – 2022-02-23 (×2): 90 mg via ORAL
  Filled 2022-02-21 (×2): qty 1

## 2022-02-21 MED ORDER — SERTRALINE HCL 50 MG PO TABS
50.0000 mg | ORAL_TABLET | Freq: Every day | ORAL | Status: DC
  Administered 2022-02-22 – 2022-02-23 (×2): 50 mg via ORAL
  Filled 2022-02-21 (×2): qty 1

## 2022-02-21 NOTE — Assessment & Plan Note (Signed)
Continue atorvastatin and Zetia. 

## 2022-02-21 NOTE — Assessment & Plan Note (Addendum)
Lowest documented SPO2 87%.  Currently saturating well on 2 L O2 via [place].  Exertional dyspnea likely multifactorial.  Imaging with new small right and trace left pleural effusions, bibasilar groundglass opacities with atelectasis and/or early pneumonia.  Patient has underlying COPD with chronic bronchitis and has not required supplemental O2 at home prior to admission.  He was given vancomycin and cefepime in the ED. -Narrow antibiotics to IV ceftriaxone and oral doxycycline -Follow blood cultures -Continue supplemental oxygen as needed, incentive spirometer

## 2022-02-21 NOTE — Assessment & Plan Note (Signed)
Sinus rhythm with controlled rate on admission.  Continue Coreg and Eliquis.

## 2022-02-21 NOTE — Assessment & Plan Note (Signed)
Hemoglobin stable at 8.8.

## 2022-02-21 NOTE — Assessment & Plan Note (Signed)
Euvolemic on admission.  Volume control with HD.

## 2022-02-21 NOTE — Assessment & Plan Note (Signed)
Continue insulin pump. 

## 2022-02-21 NOTE — Assessment & Plan Note (Signed)
Completed full HD session 6/9.  Will need nephrology consult if remains in hospital for next dialysis due 6/12.  History of severe hyperkalemia for which he takes [facility] on nondialysis days, will hold for now with mild hypokalemia.

## 2022-02-21 NOTE — Hospital Course (Signed)
Kenneth [name] is a [place] speaking 66 y.o. male with medical history significant for ESRD on MWF HD, CAD s/p CABG and DES, HFpEF (EF 60-65% TTE 07/24/2021), PAF on Eliquis, COPD, T2DM using insulin pump, HTN, HLD, anemia of chronic disease, seizure disorder, depression, uveitis who is admitted with acute respiratory failure with hypoxia.

## 2022-02-21 NOTE — Progress Notes (Signed)
Pt arrived to unit at approximately 1700. VS were taken and used telehealth to reach a [name]. Interpreter was [facility] ID # [id number]. Pt has an implanted insulin pump, hemodialysis is scheduled for Monday, Wednesday, & Friday. Pt has a fistula in the Left upper arm with thrill and bruits present.  Pt is A & O x 4 and states he does not wear oxygen at home. Stated he is hungry. Has a wife. Pt has cell phone at the bedside alone with his pants, scarf, & shirt. States he has pain with inhaling and exhaling in upper chest area.  Dr. Posey Pronto arrived to assess patient around 1830.

## 2022-02-21 NOTE — Assessment & Plan Note (Signed)
Continue Coreg, hydralazine, Imdur.

## 2022-02-21 NOTE — Assessment & Plan Note (Signed)
No wheezing on admission.  Continue Stiolto with albuterol as needed.

## 2022-02-21 NOTE — ED Provider Notes (Signed)
[facility] Provider Note   CSN: [id number] Arrival date & time: 02/21/22  1237     History  Chief Complaint  Patient presents with   Chest Pain    Kenneth [name] is a 66 y.o. male.  Patient as above with significant medical history as below, including CHF, ESRD on HD Monday Wednesday Friday, CAD, hypertension, hyperlipidemia PAF, anticoagulated on Eliquis chest pain, dyspnea.  Who presents to the ED with complaint of chest pain, dyspnea.  History provided by spouse at bedside also family pastor.  Translation services were offered, patient prefers to have pastor and spouse translate.  Patient with 7 to 10 days of ongoing chest pain, pressure, heaviness, exertional dyspnea worsened from baseline.  Worsened with exertion.  Mildly improved with rest.  Patient with very poor appetite over the past week.  Minimal p.o. intake.  He has been compliant with her medications.  Family is also concerned that he is more yellow in appearance, pale.  No significant belly pain. ?Dark stool over last week. No fevers, chills, nausea or vomiting, no change in  bladder function.  He has been compliant with home medications, compliant with Eliquis.  Spouse is also concerned the patient had a brief syncopal episode when transitioning from lying down to seated position.  No seizure-like activity was reported.  No postictal state.  Denies incontinence.  Did not fall or strike head on the ground.  Of note spouse reports patient is scheduled to be evaluated for renal [facility] at [facility]  Patient does not use home oxygen  Last dialysis session was Friday, patient received full session per family bedside   Past Medical History:  Diagnosis Date   CAD (coronary artery disease) 10/30/2016   NSTEMI 9/17 with CABG x 3 (LIMA-LAD, SVG-OM, SVG-RCA).   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   type 1 DM   ESRD on hemodialysis ([place])    "MWF; [place]" (03/30/2018)   Gastroesophageal reflux disease 08/18/2016   Hyperlipidemia    Hypertension    Ischemic rest pain of lower extremity 02/06/2015   Keratoma 02/27/2015   Metatarsal deformity 02/27/2015   PAF (paroxysmal atrial fibrillation) ([facility])    Pronation deformity of both feet 02/27/2015    Past Surgical History:  Procedure Laterality Date   A/V FISTULAGRAM Left 03/06/2021   Procedure: A/V FISTULAGRAM;  Surgeon: Marty Heck, MD;  Location: [place] [facility];  Service: Cardiovascular;  Laterality: Left;   AV FISTULA PLACEMENT Left 06/22/2016   Procedure: ARTERIOVENOUS (AV) FISTULA CREATION LEFT UPPER ARM;  Surgeon: Rosetta Posner, MD;  Location: [place];  Service: Vascular;  Laterality: Left;   BIOPSY  12/19/2020   Procedure: BIOPSY;  Surgeon: Ladene Artist, MD;  Location: [place];  Service: Endoscopy;;   CARDIAC CATHETERIZATION N/A 05/31/2016   Procedure: Left Heart Cath and Coronary Angiography;  Surgeon: Jettie Booze, MD;  Location: [place] [facility];  Service: Cardiovascular;  Laterality: N/A;   CARDIAC CATHETERIZATION N/A 05/31/2016   Procedure: Right Heart Cath;  Surgeon: Jettie Booze, MD;  Location: [place] [facility];  Service: Cardiovascular;  Laterality: N/A;   CARDIAC CATHETERIZATION N/A 05/31/2016   Procedure: IABP Insertion;  Surgeon: Jettie Booze, MD;  Location: [place] [facility];  Service: Cardiovascular;  Laterality: N/A;  COLONOSCOPY WITH PROPOFOL N/A 12/20/2020   Procedure: COLONOSCOPY WITH PROPOFOL;  Surgeon: Mauri Pole, MD;  Location: [place] [facility];  Service: Endoscopy;  Laterality: N/A;   CORONARY ARTERY BYPASS GRAFT N/A 06/05/2016   Procedure: CORONARY ARTERY BYPASS GRAFTING (CABG) x3 LIMA to LAD -SVG to OM -SVG to RCA;  Surgeon: Ivin Poot, MD;  Location: [place];  Service: Open Heart Surgery;  Laterality: N/A;   CORONARY  STENT INTERVENTION N/A 07/07/2017   Procedure: CORONARY STENT INTERVENTION;  Surgeon: Wellington Hampshire, MD;  Location: [place] [facility];  Service: Cardiovascular;  Laterality: N/A;   CORONARY STENT INTERVENTION N/A 03/30/2018   Procedure: CORONARY STENT INTERVENTION;  Surgeon: Martinique, Peter M, MD;  Location: [place] [facility];  Service: Cardiovascular;  Laterality: N/A;   ESOPHAGOGASTRODUODENOSCOPY (EGD) WITH PROPOFOL N/A 12/19/2020   Procedure: ESOPHAGOGASTRODUODENOSCOPY (EGD) WITH PROPOFOL;  Surgeon: Ladene Artist, MD;  Location: [facility];  Service: Endoscopy;  Laterality: N/A;   GIVENS CAPSULE STUDY N/A 12/20/2020   Procedure: GIVENS CAPSULE STUDY;  Surgeon: Mauri Pole, MD;  Location: [place];  Service: Endoscopy;  Laterality: N/A;   INSERTION OF DIALYSIS CATHETER N/A 06/05/2016   Procedure: INSERTION OF DIALYSIS/trialysis CATHETER;  Surgeon: Ivin Poot, MD;  Location: [place];  Service: Vascular;  Laterality: N/A;   INSERTION OF DIALYSIS CATHETER Right 06/13/2016   Procedure: INSERTION OF DIALYSIS CATHETER RIGHT INTERNAL JUGULAR;  Surgeon: Conrad Hawthorne, MD;  Location: [place];  Service: Vascular;  Laterality: Right;   INSERTION OF DIALYSIS CATHETER Right 09/07/2019   Procedure: Insertion Of Dialysis Catheter;  Surgeon: Rosetta Posner, MD;  Location: [facility];  Service: Vascular;  Laterality: Right;   INTRAOPERATIVE TRANSESOPHAGEAL ECHOCARDIOGRAM N/A 06/05/2016   Procedure: INTRAOPERATIVE TRANSESOPHAGEAL ECHOCARDIOGRAM;  Surgeon: Ivin Poot, MD;  Location: [place];  Service: Open Heart Surgery;  Laterality: N/A;   LEFT HEART CATH AND CORS/GRAFTS ANGIOGRAPHY N/A 11/03/2016   Procedure: Left Heart Cath and Cors/Grafts Angiography;  Surgeon: Troy Sine, MD;  Location: [place] [facility];  Service: Cardiovascular;  Laterality: N/A;   LEFT HEART CATH AND CORS/GRAFTS ANGIOGRAPHY N/A 07/07/2017   Procedure: LEFT HEART CATH AND CORS/GRAFTS ANGIOGRAPHY;  Surgeon: Larey Dresser,  MD;  Location: [place] [facility];  Service: Cardiovascular;  Laterality: N/A;   LEFT HEART CATH AND CORS/GRAFTS ANGIOGRAPHY N/A 03/30/2018   Procedure: LEFT HEART CATH AND CORS/GRAFTS ANGIOGRAPHY;  Surgeon: Martinique, Peter M, MD;  Location: [place] [facility];  Service: Cardiovascular;  Laterality: N/A;   LEFT HEART CATH AND CORS/GRAFTS ANGIOGRAPHY N/A 04/17/2019   Procedure: LEFT HEART CATH AND CORS/GRAFTS ANGIOGRAPHY;  Surgeon: Larey Dresser, MD;  Location: [place] [facility];  Service: Cardiovascular;  Laterality: N/A;   PERIPHERAL VASCULAR INTERVENTION Left 03/06/2021   Procedure: PERIPHERAL VASCULAR INTERVENTION;  Surgeon: Marty Heck, MD;  Location: [place] [facility];  Service: Cardiovascular;  Laterality: Left;   POLYPECTOMY  12/20/2020   Procedure: POLYPECTOMY;  Surgeon: Mauri Pole, MD;  Location: [place] [facility];  Service: Endoscopy;;   REVISION OF ARTERIOVENOUS GORETEX GRAFT Left 46/96/2952   Procedure: PLICATION OF ANEURYSM OF ARTERIOVENOUS FISTULA  LEFT ARM;  Surgeon: Rosetta Posner, MD;  Location: [facility];  Service: Vascular;  Laterality: Left;   REVISON OF ARTERIOVENOUS FISTULA Left 09/07/2019   Procedure: REVISON OF LEFT UPPER ARM ARTERIOVENOUS FISTULA;  Surgeon: Rosetta Posner, MD;  Location: [facility];  Service: Vascular;  Laterality: Left;   RIGHT/LEFT HEART CATH AND  CORONARY ANGIOGRAPHY N/A 01/03/2021   Procedure: RIGHT/LEFT HEART CATH AND CORONARY ANGIOGRAPHY;  Surgeon: Larey Dresser, MD;  Location: [place] [facility];  Service: Cardiovascular;  Laterality: N/A;     The history is provided by the patient, the spouse and a friend. No language interpreter was used.  Chest Pain Associated symptoms: fatigue and shortness of breath   Associated symptoms: no abdominal pain, no cough, no dysphagia, no fever, no headache, no nausea, no palpitations and no vomiting        Home Medications Prior to Admission medications   Medication Sig Start Date End Date Taking?  Authorizing Provider  apixaban (ELIQUIS) 5 MG TABS tablet Take 1 tablet by mouth 2 times daily 10/17/21   Larey Dresser, MD  atorvastatin (LIPITOR) 80 MG tablet Take 1 tablet (80 mg total) by mouth daily. 10/16/21   Larey Dresser, MD  calcium acetate (PHOSLO) 667 MG capsule Take 667 mg by mouth 3 (three) times daily with meals.  07/09/17   [provider]  carvedilol (COREG) 6.25 MG tablet Take 1 tablet (6.25 mg total) by mouth 2 (two) times daily with a meal. Hold on the mornings of dialysis. 02/10/22   Shelda Pal, DO  Continuous Blood Gluc Sensor (FREESTYLE LIBRE 2 SENSOR) MISC 1 Device by Does not apply route every 14 (fourteen) days. 10/28/21   Shamleffer, Melanie Crazier, MD  diclofenac Sodium (VOLTAREN) 1 % GEL Apply 4 grams topically 4 (four) times daily. 09/16/21   Shelda Pal, DO  ezetimibe (ZETIA) 10 MG tablet Take 1 tablet by mouth daily 05/20/21   Larey Dresser, MD  glucose blood [facility] VERIO) test strip 1 each by Other route in the morning, at noon, in the evening, and at bedtime. Use as instructed 01/01/22   Shamleffer, Melanie Crazier, MD  hydrALAZINE (APRESOLINE) 100 MG tablet Take 1 tablet (100 mg total) by mouth 3 (three) times daily. DO NOT TAKE AM AND NOON DOSES ON HD DAYS 09/16/21   Shelda Pal, DO  Insulin Human (INSULIN PUMP) SOLN Inject 1 each into the skin continuous. NOVOLOG - 20 units    [provider]  Insulin Lispro-aabc (LYUMJEV) 100 UNIT/ML SOLN Max Daily 30 units per insulin pump 01/06/22   Shamleffer, Melanie Crazier, MD  Insulin Pen Needle (PEN NEEDLES) 32G X 6 MM MISC Use as directed 3 times daily 12/18/21   Shamleffer, Melanie Crazier, MD  isosorbide mononitrate (IMDUR) 60 MG 24 hr tablet Take 1&1/2 tablets (90 mg total) by mouth daily. Hold on days of dialysis on Mon, Wed, Fri. 02/10/22   Larey Dresser, MD  Methoxy PEG-Epoetin Beta (MIRCERA IJ) Mircera 03/17/21 03/16/22  [provider]  pantoprazole  (PROTONIX) 40 MG tablet Take 1 tablet (40 mg total) by mouth 2 (two) times daily. 10/23/21   Hunsucker, Bonna Gains, MD  sertraline (ZOLOFT) 50 MG tablet Take 1 tablet (50 mg total) by mouth daily. 11/24/21   Shelda Pal, DO  Sodium Zirconium Cyclosilicate (LOKELMA PO) Take 1 packet by mouth 4 (four) times a week. Hold on dialysis days    [provider]  Tiotropium Bromide-Olodaterol (STIOLTO RESPIMAT) 2.5-2.5 MCG/ACT AERS Inhale 2 puffs by mouth into the lungs daily. 01/20/22   Hunsucker, Bonna Gains, MD      Allergies    Patient has no known allergies.    Review of Systems   Review of Systems  Constitutional:  Positive for appetite change and fatigue. Negative for chills and  fever.  HENT:  Negative for facial swelling and trouble swallowing.   Eyes:  Negative for photophobia and visual disturbance.  Respiratory:  Positive for shortness of breath. Negative for cough.   Cardiovascular:  Positive for chest pain. Negative for palpitations.  Gastrointestinal:  Negative for abdominal pain, nausea and vomiting.  Endocrine: Negative for polydipsia and polyuria.  Genitourinary:  Negative for difficulty urinating and hematuria.  Musculoskeletal:  Negative for gait problem and joint swelling.  Skin:  Positive for color change. Negative for pallor and rash.  Neurological:  Negative for syncope and headaches.  Psychiatric/Behavioral:  Negative for agitation and confusion.     Physical Exam Updated Vital Signs BP 132/61   Pulse 70   Temp 98.8 F (37.1 C) (Oral)   Resp 13   SpO2 96%  Physical Exam Vitals and nursing note reviewed.  Constitutional:      General: He is not in acute distress.    Appearance: Normal appearance. He is well-developed. He is not diaphoretic.  HENT:     Head: Normocephalic and atraumatic. No raccoon eyes, Battle's sign, right periorbital erythema or left periorbital erythema.     Jaw: There is normal jaw occlusion.     Right Ear: External ear normal.      Left Ear: External ear normal.     Mouth/Throat:     Mouth: Mucous membranes are moist.  Eyes:     General: Scleral icterus present.     Extraocular Movements: Extraocular movements intact.     Pupils: Pupils are equal, round, and reactive to light.  Cardiovascular:     Rate and Rhythm: Normal rate and regular rhythm.     Pulses: Normal pulses.     Heart sounds: Normal heart sounds.  Pulmonary:     Effort: Pulmonary effort is normal. No tachypnea, accessory muscle usage or respiratory distress.     Breath sounds: Normal breath sounds. No stridor. No wheezing.  Abdominal:     General: Abdomen is flat.     Palpations: Abdomen is soft.     Tenderness: There is generalized abdominal tenderness.  Musculoskeletal:        General: Normal range of motion.     Cervical back: Normal range of motion.     Right lower leg: No edema.     Left lower leg: No edema.  Skin:    General: Skin is warm and dry.     Capillary Refill: Capillary refill takes less than 2 seconds.     Comments: LUE fistula, palpable bruit  Neurological:     Mental Status: He is alert and oriented to person, place, and time. Mental status is at baseline.     GCS: GCS eye subscore is 4. GCS verbal subscore is 5. GCS motor subscore is 6.     Cranial Nerves: Cranial nerves 2-12 are intact. No dysarthria.     Sensory: Sensation is intact.     Motor: Motor function is intact. No tremor.     Coordination: Coordination is intact.  Psychiatric:        Mood and Affect: Mood normal.        Behavior: Behavior normal.     ED Results / Procedures / Treatments   Labs (all labs ordered are listed, but only abnormal results are displayed) Labs Reviewed  CBC - Abnormal; Notable for the following components:      Result Value   RBC 2.79 (*)    Hemoglobin 8.8 (*)    HCT 25.9 (*)  All other components within normal limits  D-DIMER, QUANTITATIVE - Abnormal; Notable for the following components:   D-Dimer, Quant 0.78 (*)     All other components within normal limits  COMPREHENSIVE METABOLIC PANEL - Abnormal; Notable for the following components:   Sodium 132 (*)    Potassium 3.2 (*)    Chloride 91 (*)    Glucose, Bld 252 (*)    Creatinine, Ser 5.73 (*)    Calcium 8.3 (*)    Albumin 3.1 (*)    GFR, Estimated 10 (*)    All other components within normal limits  CBG MONITORING, ED - Abnormal; Notable for the following components:   Glucose-Capillary 260 (*)    All other components within normal limits  I-STAT VENOUS BLOOD GAS, ED - Abnormal; Notable for the following components:   pH, Ven 7.576 (*)    pCO2, Ven 34.4 (*)    Bicarbonate 32.0 (*)    TCO2 33 (*)    Acid-Base Excess 9.0 (*)    Sodium 131 (*)    Potassium 3.3 (*)    Calcium, Ion 0.92 (*)    HCT 26.0 (*)    Hemoglobin 8.8 (*)    All other components within normal limits  TROPONIN I (HIGH SENSITIVITY) - Abnormal; Notable for the following components:   Troponin I (High Sensitivity) 325 (*)    All other components within normal limits  SARS CORONAVIRUS 2 BY RT PCR  CULTURE, BLOOD (ROUTINE X 2)  CULTURE, BLOOD (ROUTINE X 2)  MRSA NEXT GEN BY PCR, NASAL  LIPASE, BLOOD  CK  LACTIC ACID, PLASMA  AMMONIA  BLOOD GAS, VENOUS  URINALYSIS, ROUTINE W REFLEX MICROSCOPIC  TSH  LEVETIRACETAM LEVEL  TROPONIN I (HIGH SENSITIVITY)    EKG EKG Interpretation  Date/Time:  Saturday February 21 2022 13:01:34 EDT Ventricular Rate:  68 PR Interval:  175 QRS Duration: 155 QT Interval:  506 QTC Calculation: 539 R Axis:   116 Text Interpretation: Sinus rhythm LVH by voltage Probable inferior infarct, age indeterminate Repol abnrm, severe global ischemia (LM/MVD) Prolonged QT interval similar to prior Abnormal ECG Confirmed by Wynona Dove ([id number]) on 02/21/2022 1:15:11 PM  Radiology CT ABDOMEN PELVIS W CONTRAST  Result Date: 02/21/2022 CLINICAL DATA:  Abdominal pain. EXAM: CT ABDOMEN AND PELVIS WITH CONTRAST TECHNIQUE: Multidetector CT imaging of the  abdomen and pelvis was performed using the standard protocol following bolus administration of intravenous contrast. RADIATION DOSE REDUCTION: This exam was performed according to the departmental dose-optimization program which includes automated exposure control, adjustment of the mA and/or kV according to patient size and/or use of iterative reconstruction technique. CONTRAST:  195mL OMNIPAQUE IOHEXOL 350 MG/ML SOLN COMPARISON:  CT [id number] FINDINGS: Lower chest: Dependent ground-glass opacity in both lungs is likely related to atelectasis although multifocal pneumonia not excluded. Small right and tiny left pleural effusions evident. Hepatobiliary: No suspicious focal abnormality within the liver parenchyma. There is no evidence for gallstones, gallbladder wall thickening, or pericholecystic fluid. No intrahepatic or extrahepatic biliary dilation. Pancreas: No focal mass lesion. No dilatation of the main duct. No intraparenchymal cyst. No peripancreatic edema. Spleen: No splenomegaly. No focal mass lesion. Adrenals/Urinary Tract: No adrenal nodule or mass. Stable small cyst noted upper pole left kidney. No followup recommended. Right kidney unremarkable. No evidence for hydroureter. The urinary bladder appears normal for the degree of distention. Stomach/Bowel: Stomach is nondistended. Duodenum is normally positioned as is the ligament of Treitz. No small bowel wall thickening. No small bowel dilatation. The terminal  ileum is normal. The appendix is normal. No gross colonic mass. No colonic wall thickening. Vascular/Lymphatic: There is moderate atherosclerotic calcification of the abdominal aorta without aneurysm. There is no gastrohepatic or hepatoduodenal ligament lymphadenopathy. No retroperitoneal or mesenteric lymphadenopathy. No pelvic sidewall lymphadenopathy. Reproductive: The prostate gland and seminal vesicles are unremarkable. Other: No intraperitoneal free fluid. Musculoskeletal: No worrisome lytic  or sclerotic osseous abnormality. IMPRESSION: 1. No acute findings in the abdomen or pelvis. 2. Dependent ground-glass opacity in both lungs is likely related to atelectasis although multifocal pneumonia not excluded. Small right and tiny left pleural effusions evident. 3. Aortic Atherosclerosis (ICD10-I70.0). Electronically Signed   By: Misty Stanley M.D.   On: 02/21/2022 15:03   CT Angio Chest PE W and/or Wo Contrast  Result Date: 02/21/2022 CLINICAL DATA:  PE suspected, right-sided chest pain for 10 days EXAM: CT ANGIOGRAPHY CHEST WITH CONTRAST TECHNIQUE: Multidetector CT imaging of the chest was performed using the standard protocol during bolus administration of intravenous contrast. Multiplanar CT image reconstructions and MIPs were obtained to evaluate the vascular anatomy. RADIATION DOSE REDUCTION: This exam was performed according to the departmental dose-optimization program which includes automated exposure control, adjustment of the mA and/or kV according to patient size and/or use of iterative reconstruction technique. CONTRAST:  153mL OMNIPAQUE IOHEXOL 350 MG/ML SOLN COMPARISON:  10/21/2021 FINDINGS: Cardiovascular: Satisfactory opacification of the pulmonary arteries to the segmental level. No evidence of pulmonary embolism. Cardiomegaly. Three-vessel coronary artery calcifications status post median sternotomy and CABG. No pericardial effusion. Mediastinum/Nodes: No enlarged mediastinal, hilar, or axillary lymph nodes. Thyroid gland, trachea, and esophagus demonstrate no significant findings. Lungs/Pleura: Small right, trace left pleural effusions. Moderate centrilobular and paraseptal emphysema. Upper Abdomen: No acute abnormality. Please see separately reported examination of the abdomen and pelvis. Musculoskeletal: No chest wall abnormality. No acute osseous findings. Review of the MIP images confirms the above findings. IMPRESSION: 1. Negative examination for pulmonary embolism. 2. Small  right, trace left pleural effusions. 3. Moderate emphysema. 4. Coronary artery disease. Emphysema (ICD10-J43.9). Electronically Signed   By: Delanna Ahmadi M.D.   On: 02/21/2022 14:57   CT Head Wo Contrast  Result Date: 02/21/2022 CLINICAL DATA:  Syncope. EXAM: CT HEAD WITHOUT CONTRAST TECHNIQUE: Contiguous axial images were obtained from the base of the skull through the vertex without intravenous contrast. RADIATION DOSE REDUCTION: This exam was performed according to the departmental dose-optimization program which includes automated exposure control, adjustment of the mA and/or kV according to patient size and/or use of iterative reconstruction technique. COMPARISON:  12/22/2020 FINDINGS: Brain: No acute intracranial hemorrhage. No focal mass lesion. No CT evidence of acute infarction. No midline shift or mass effect. No hydrocephalus. Basilar cisterns are patent. There are periventricular and subcortical white matter hypodensities. Generalized cortical atrophy. Vascular: No hyperdense vessel or unexpected calcification. Skull: Normal. Negative for fracture or focal lesion. Sinuses/Orbits: Paranasal sinuses and mastoid air cells are clear. Orbits are clear. Other: None. IMPRESSION: 1. No change from comparison exam. 2. Atrophy and subcortical white matter disease. Electronically Signed   By: Suzy Bouchard M.D.   On: 02/21/2022 14:48   DG Chest Port 1 View  Result Date: 02/21/2022 CLINICAL DATA:  Short of breath EXAM: PORTABLE CHEST 1 VIEW COMPARISON:  None Available. FINDINGS: Sternotomy wires overlie normal cardiac silhouette. No effusion, infiltrate or pneumothorax. Normal pulmonary vasculature. Atherosclerotic calcification of the aorta. IMPRESSION: No acute cardiopulmonary process. Electronically Signed   By: Suzy Bouchard M.D.   On: 02/21/2022 13:21    Procedures .Critical  Care  Performed by: Jeanell Sparrow, DO Authorized by: Jeanell Sparrow, DO   Critical care provider statement:     Critical care time (minutes):  51   Critical care time was exclusive of:  Separately billable procedures and treating other patients   Critical care was necessary to treat or prevent imminent or life-threatening deterioration of the following conditions:  Cardiac failure and respiratory failure   Critical care was time spent personally by me on the following activities:  Development of treatment plan with patient or surrogate, discussions with consultants, evaluation of patient's response to treatment, examination of patient, ordering and review of laboratory studies, ordering and review of radiographic studies, ordering and performing treatments and interventions, pulse oximetry, re-evaluation of patient's condition, review of old charts and obtaining history from patient or surrogate     Medications Ordered in ED Medications  ceFEPIme (MAXIPIME) 1 g in sodium chloride 0.9 % 100 mL IVPB (has no administration in time range)  vancomycin (VANCOCIN) IVPB 1000 mg/200 mL premix (has no administration in time range)    Followed by  vancomycin (VANCOCIN) 500 mg in sodium chloride 0.9 % 100 mL IVPB (has no administration in time range)  vancomycin (VANCOCIN) 500 mg in sodium chloride 0.9 % 100 mL IVPB (has no administration in time range)  sodium chloride 0.9 % bolus 500 mL (500 mLs Intravenous New Bag/Given 02/21/22 1330)  pantoprazole (PROTONIX) injection 40 mg (40 mg Intravenous Given 02/21/22 1329)  iohexol (OMNIPAQUE) 350 MG/ML injection 100 mL (100 mLs Intravenous Contrast Given 02/21/22 1416)    ED Course/ Medical Decision Making/ A&P                           Medical Decision Making Amount and/or Complexity of Data Reviewed Labs: ordered. Radiology: ordered.  Risk Prescription drug management. Decision regarding hospitalization.    CC: poor po, cp, dib  This patient presents to the Emergency Department for the above complaint. This involves an extensive number of treatment options  and is a complaint that carries with it a high risk of complications and morbidity. Vital signs were reviewed. Serious etiologies considered.  Differential includes all life-threatening causes for chest pain. This includes but is not exclusive to acute coronary syndrome, aortic dissection, pulmonary embolism, cardiac tamponade, community-acquired pneumonia, pericarditis, musculoskeletal chest wall pain, etc.  Record review:  Previous records obtained and reviewed  Recent hospitalization, prior office notes, prior labs and imaging, home medications  Additional history obtained from , [facility] and surgical history as noted above.   Work up as above, notable for:  Labs & imaging results that were available during my care of the patient were visualized by me and considered in my medical decision making.   I ordered imaging studies which included CT head, CTPE, CTAP, chest x-ray. I visualized the imaging, interpreted images, and I agree with radiologist interpretation. Chest x-ray stable. CT concerning for multifocal pna. CTH neg, no saddle embolus or large PE on CTPE  Cardiac monitoring reviewed and interpreted personally which shows NSR. ECG similar to prior tracing.   ECG similar to prior, no STEMI.  Troponin is chronically elevated, patient with ongoing chest pain.  He is hypoxic requiring 2 L nasal cannula.  No home oxygen requirement at home.  He is anticoagulated on eliquis at home. Chest pain improved while on [place], chronically elevated troponin. Will hold off on further anticoagulation at this time.  Favor demand ischemia from  hypoxia  Labs reviewed and otherwise are reassuring. Hgb is similar to his baseline.   Management: Vanc/cefepime given he is ESRD on HD  Reassessment:  Pt feeling better, continues to require 2 L nasal cannula.  Admission was considered.   Recommend admission for respiratory failure likely secondary to pneumonia.  Patient is agreeable.  Patient signed  out to incoming EDP pending callback from admitting team.  Will require admission to [facility] as he is dialysis patient. Continue on 2L [place].   Follows with Dr Silas Flood pulmonary              Social determinants of health include -  Social History   Socioeconomic History   Marital status: Married    Spouse name: Not on file   Number of children: Not on file   Years of education: Not on file   Highest education level: Not on file  Occupational History   Not on file  Tobacco Use   Smoking status: Former    Packs/day: 1.50    Years: 35.00    Total pack years: 52.50    Types: Cigarettes    Quit date: 2014    Years since quitting: 9.4   Smokeless tobacco: Never   Tobacco comments:    "quit e-cigs ~ 2014"  Vaping Use   Vaping Use: Former  Substance and Sexual Activity   Alcohol use: Never    Alcohol/week: 0.0 standard drinks of alcohol   Drug use: Never   Sexual activity: Not on file  Other Topics Concern   Not on file  Social History Narrative   Not on file   Social Determinants of Health   Financial Resource Strain: Not on file  Food Insecurity: Not on file  Transportation Needs: Not on file  Physical Activity: Not on file  Stress: Not on file  Social Connections: Not on file  Intimate Partner Violence: Not on file      This chart was dictated using voice recognition software.  Despite best efforts to proofread,  errors can occur which can change the documentation meaning.         Final Clinical Impression(s) / ED Diagnoses Final diagnoses:  Acute respiratory failure with hypoxia ([place])  Healthcare-associated pneumonia  Elevated troponin    Rx / DC Orders ED Discharge Orders     None         Jeanell Sparrow, DO 02/21/22 1521

## 2022-02-21 NOTE — Progress Notes (Signed)
Pharmacy Antibiotic Note  Kenneth [name] is a 66 y.o. male admitted on 02/21/2022 presenting with CP and syncopal episode, SOB.  Pharmacy has been consulted for vancomycin dosing.  ESRD-HD usually MWF, cefepime per MD  Plan: Vancomycin 1500 mg IV x 1, then 500 mg IV q HD Add MRSA PCR Monitor iHD schedule, Cx/PCR to narrow Vancomycin random level as needed     Temp (24hrs), Avg:98.8 F (37.1 C), Min:98.8 F (37.1 C), Max:98.8 F (37.1 C)  Recent Labs  Lab 02/21/22 1250  WBC 6.7  CREATININE 5.73*  LATICACIDVEN 1.2    CrCl cannot be calculated (Unknown ideal weight.).    No Known Allergies  Bertis Ruddy, PharmD Clinical Pharmacist ED Pharmacist Phone # [phone] 02/21/2022 3:15 PM

## 2022-02-21 NOTE — ED Triage Notes (Signed)
Pt with CP on RT side x 10 days; wife sts his skin looked different today; pt had syncopal episode while transferring to bed

## 2022-02-21 NOTE — ED Provider Notes (Signed)
Pt signed out to me by Dr Antony Salmon.  Briefly 66 yo male presenting to the ED with chest pain, shortness of breath, hypoxia.  CT PE study negative for PE, but CT abdomen noting lower lobe PNA.  He is newly hypoxic on 2L .  Lactate wnl.    Pt hx of ESRD on dialysis MWF, no missed sessions.  Pending medical admission for PNA   Wyvonnia Dusky, MD 02/21/22 2136

## 2022-02-21 NOTE — Assessment & Plan Note (Addendum)
Has intermittent exertional chest discomfort, denies chest pain on arrival to floor.  Has chronically elevated troponin which is downtrending and lower than previous levels.  Likely secondary to demand ischemia from hypoxia.  Follows with cardiology, Dr. Aundra Dubin.  Not on antiplatelet as he is on Eliquis. -Continue Eliquis 5 mg twice daily -Continue Coreg 6.25 mg twice daily -Continue Imdur 90 mg nondialysis days -Continue atorvastatin and Zetia

## 2022-02-21 NOTE — H&P (Signed)
History and Physical    Kenneth [name] NOM:[id number] DOB: 14-Mar-1956 DOA: 02/21/2022  PCP: Shelda Pal, DO  Patient coming from: Home  I have personally briefly reviewed patient's old medical records in [place]  Chief Complaint: Shortness of breath  HPI: Kenneth [name] is a [place] speaking 66 y.o. male with medical history significant for ESRD on MWF HD, CAD s/p CABG and DES, HFpEF (EF 60-65% TTE 07/24/2021), PAF on Eliquis, COPD, T2DM using insulin pump, HTN, HLD, anemia of chronic disease, seizure disorder, depression, uveitis who presented to the ED for evaluation of shortness of breath.  Patient presented to the ED with 7-10 days of progressive exertional dyspnea with associated intermittent right-sided chest discomfort.  He has chronic cough at baseline attributed to chronic bronchitis from prior smoking history.  Symptoms improved with rest.  He has been feeling generally weak.  He has had some subjective fevers and diaphoresis.  He does not make urine.  Patient states he did complete his full HD session this past Friday.  He uses an insulin pump for his type 1 diabetes which he was diagnosed around age 46.  ED Course  Labs/Imaging on admission: I have personally reviewed following labs and imaging studies.  Initial vitals showed BP 127/48, pulse 69, RR 20, temp 98.8 F, SPO2 97% on 2 L O2 via [place].  Lowest documented SPO2 87%.  Labs show WBC 6.7, hemoglobin 8.8, platelets 187,000, sodium 132, potassium 3.2, bicarb 31, BUN 23, creatinine 5.73, serum glucose 252, LFTs within normal limits, lipase 32, CK 81, lactic acid 1.2, D-dimer 0.78, ammonia 14.  Troponin 325 > 280.  SARS-CoV-2 PCR negative.  Single blood culture in process.  Portable chest x-ray negative for focal consolidation, edema, effusion.  CT head without contrast negative for acute intracranial hemorrhage, focal mass lesion, acute infarction.  CTA chest PE negative for evidence of PE.  Small right and  trace left pleural effusion seen.  Emphysematous CAD changes noted.  CT abdomen/pelvis with contrast negative acute findings.  Dependent groundglass opacity in both lungs noted.  Patient was given 500 cc normal saline, IV vancomycin and cefepime.  The hospitalist service was consulted to admit for further evaluation and management.  Review of Systems: All systems reviewed and are negative except as documented in history of present illness above.   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   type 1 DM   ESRD on hemodialysis ([place])    "MWF; [place]" (03/30/2018)   Gastroesophageal reflux disease 08/18/2016   Hyperlipidemia    Hypertension    Ischemic rest pain of lower extremity 02/06/2015   Keratoma 02/27/2015   Metatarsal deformity 02/27/2015   PAF (paroxysmal atrial fibrillation) (HCC)    Pronation deformity of both feet 02/27/2015    Past Surgical History:  Procedure Laterality Date   A/V FISTULAGRAM Left 03/06/2021   Procedure: A/V FISTULAGRAM;  Surgeon: Marty Heck, MD;  Location: [place] [facility];  Service: Cardiovascular;  Laterality: Left;   AV FISTULA PLACEMENT Left 06/22/2016   Procedure: ARTERIOVENOUS (AV) FISTULA CREATION LEFT UPPER ARM;  Surgeon: Rosetta Posner, MD;  Location: [place];  Service: Vascular;  Laterality: Left;   BIOPSY  12/19/2020   Procedure: BIOPSY;  Surgeon: Ladene Artist, MD;  Location: [place];  Service: Endoscopy;;   CARDIAC CATHETERIZATION N/A 05/31/2016   Procedure: Left Heart Cath and Coronary Angiography;  Surgeon: Jettie Booze, MD;  Location: [place] [facility];  Service: Cardiovascular;  Laterality: N/A;   CARDIAC CATHETERIZATION N/A 05/31/2016    Procedure: Right Heart Cath;  Surgeon: Jettie Booze, MD;  Location: [place] [facility];  Service: Cardiovascular;  Laterality: N/A;   CARDIAC CATHETERIZATION N/A 05/31/2016   Procedure: IABP Insertion;  Surgeon: Jettie Booze, MD;  Location: [place] [facility];  Service: Cardiovascular;  Laterality: N/A;   COLONOSCOPY WITH PROPOFOL N/A 12/20/2020   Procedure: COLONOSCOPY WITH PROPOFOL;  Surgeon: Mauri Pole, MD;  Location: [place] [facility];  Service: Endoscopy;  Laterality: N/A;   CORONARY ARTERY BYPASS GRAFT N/A 06/05/2016   Procedure: CORONARY ARTERY BYPASS GRAFTING (CABG) x3 LIMA to LAD -SVG to OM -SVG to RCA;  Surgeon: Ivin Poot, MD;  Location: [place];  Service: Open Heart Surgery;  Laterality: N/A;   CORONARY STENT INTERVENTION N/A 07/07/2017   Procedure: CORONARY STENT INTERVENTION;  Surgeon: Wellington Hampshire, MD;  Location: [place] [facility];  Service: Cardiovascular;  Laterality: N/A;   CORONARY STENT INTERVENTION N/A 03/30/2018   Procedure: CORONARY STENT INTERVENTION;  Surgeon: Martinique, Peter M, MD;  Location: [place] [facility];  Service: Cardiovascular;  Laterality: N/A;   ESOPHAGOGASTRODUODENOSCOPY (EGD) WITH PROPOFOL N/A 12/19/2020   Procedure: ESOPHAGOGASTRODUODENOSCOPY (EGD) WITH PROPOFOL;  Surgeon: Ladene Artist, MD;  Location[facility];  Service: Endoscopy;  Laterality: N/A;   GIVENS CAPSULE STUDY N/A 12/20/2020   Procedure: GIVENS CAPSULE STUDY;  Surgeon: Mauri Pole, MD;  Location: [place];  Service: Endoscopy;  Laterality: N/A;   INSERTION OF DIALYSIS CATHETER N/A 06/05/2016   Procedure: INSERTION OF DIALYSIS/trialysis CATHETER;  Surgeon: Ivin Poot, MD;  Location: [place];  Service: Vascular;  Laterality: N/A;   INSERTION OF DIALYSIS CATHETER Right 06/13/2016   Procedure: INSERTION OF DIALYSIS CATHETER RIGHT INTERNAL JUGULAR;  Surgeon: Conrad Lowndesboro, MD;  Location: [place];  Service: Vascular;  Laterality: Right;   INSERTION OF DIALYSIS  CATHETER Right 09/07/2019   Procedure: Insertion Of Dialysis Catheter;  Surgeon: Rosetta Posner, MD;  [facility];  Service: Vascular;  Laterality: Right;   INTRAOPERATIVE TRANSESOPHAGEAL ECHOCARDIOGRAM N/A 06/05/2016   Procedure: INTRAOPERATIVE TRANSESOPHAGEAL ECHOCARDIOGRAM;  Surgeon: Ivin Poot, MD;  Location: [place];  Service: Open Heart Surgery;  Laterality: N/A;   LEFT HEART CATH AND CORS/GRAFTS ANGIOGRAPHY N/A 11/03/2016   Procedure: Left Heart Cath and Cors/Grafts Angiography;  Surgeon: Troy Sine, MD;  Location: [place] [facility];  Service: Cardiovascular;  Laterality: N/A;   LEFT HEART CATH AND CORS/GRAFTS ANGIOGRAPHY N/A 07/07/2017   Procedure: LEFT HEART CATH AND CORS/GRAFTS ANGIOGRAPHY;  Surgeon: Larey Dresser, MD;  Location: [place] [facility];  Service: Cardiovascular;  Laterality: N/A;   LEFT HEART CATH AND CORS/GRAFTS ANGIOGRAPHY N/A 03/30/2018   Procedure: LEFT HEART CATH AND CORS/GRAFTS ANGIOGRAPHY;  Surgeon: Martinique, Peter M, MD;  Location: [place] [facility];  Service: Cardiovascular;  Laterality: N/A;   LEFT HEART CATH AND CORS/GRAFTS ANGIOGRAPHY N/A 04/17/2019   Procedure: LEFT HEART CATH AND CORS/GRAFTS ANGIOGRAPHY;  Surgeon: Larey Dresser, MD;  Location: [place] [facility];  Service: Cardiovascular;  Laterality: N/A;   PERIPHERAL VASCULAR INTERVENTION Left 03/06/2021   Procedure: PERIPHERAL VASCULAR  INTERVENTION;  Surgeon: Marty Heck, MD;  Location: [place] [facility];  Service: Cardiovascular;  Laterality: Left;   POLYPECTOMY  12/20/2020   Procedure: POLYPECTOMY;  Surgeon: Mauri Pole, MD;  Location: [place];  Service: Endoscopy;;   REVISION OF ARTERIOVENOUS GORETEX GRAFT Left 43/15/4008   Procedure: PLICATION OF ANEURYSM OF ARTERIOVENOUS FISTULA  LEFT ARM;  Surgeon: Rosetta Posner, MD;  Location: [place];  Service: Vascular;  Laterality: Left;   REVISON OF ARTERIOVENOUS FISTULA Left 09/07/2019   Procedure: REVISON OF LEFT UPPER ARM  ARTERIOVENOUS FISTULA;  Surgeon: Rosetta Posner, MD;  Location: [place];  Service: Vascular;  Laterality: Left;   RIGHT/LEFT HEART CATH AND CORONARY ANGIOGRAPHY N/A 01/03/2021   Procedure: RIGHT/LEFT HEART CATH AND CORONARY ANGIOGRAPHY;  Surgeon: Larey Dresser, MD;  Location: [place] [facility];  Service: Cardiovascular;  Laterality: N/A;    Social History:  reports that he quit smoking about 9 years ago. His smoking use included cigarettes. He has a 52.50 pack-year smoking history. He has never used smokeless tobacco. He reports that he does not drink alcohol and does not use drugs.  No Known Allergies  Family History  Problem Relation Age of Onset   CAD Father    Colon cancer Father    Diabetes Brother      Prior to Admission medications   Medication Sig Start Date End Date Taking? Authorizing Provider  apixaban (ELIQUIS) 5 MG TABS tablet Take 1 tablet by mouth 2 times daily 10/17/21   Larey Dresser, MD  atorvastatin (LIPITOR) 80 MG tablet Take 1 tablet (80 mg total) by mouth daily. 10/16/21   Larey Dresser, MD  calcium acetate (PHOSLO) 667 MG capsule Take 667 mg by mouth 3 (three) times daily with meals.  07/09/17   [provider]  carvedilol (COREG) 6.25 MG tablet Take 1 tablet (6.25 mg total) by mouth 2 (two) times daily with a meal. Hold on the mornings of dialysis. 02/10/22   Shelda Pal, DO  Continuous Blood Gluc Sensor (FREESTYLE LIBRE 2 SENSOR) MISC 1 Device by Does not apply route every 14 (fourteen) days. 10/28/21   Shamleffer, Melanie Crazier, MD  diclofenac Sodium (VOLTAREN) 1 % GEL Apply 4 grams topically 4 (four) times daily. 09/16/21   Shelda Pal, DO  ezetimibe (ZETIA) 10 MG tablet Take 1 tablet by mouth daily 05/20/21   Larey Dresser, MD  glucose blood [facility] VERIO) test strip 1 each by Other route in the morning, at noon, in the evening, and at bedtime. Use as instructed 01/01/22   Shamleffer, Melanie Crazier, MD  hydrALAZINE  (APRESOLINE) 100 MG tablet Take 1 tablet (100 mg total) by mouth 3 (three) times daily. DO NOT TAKE AM AND NOON DOSES ON HD DAYS 09/16/21   Shelda Pal, DO  Insulin Human (INSULIN PUMP) SOLN Inject 1 each into the skin continuous. NOVOLOG - 20 units    [provider]  Insulin Lispro-aabc (LYUMJEV) 100 UNIT/ML SOLN Max Daily 30 units per insulin pump 01/06/22   Shamleffer, Melanie Crazier, MD  Insulin Pen Needle (PEN NEEDLES) 32G X 6 MM MISC Use as directed 3 times daily 12/18/21   Shamleffer, Melanie Crazier, MD  isosorbide mononitrate (IMDUR) 60 MG 24 hr tablet Take 1&1/2 tablets (90 mg total) by mouth daily. Hold on days of dialysis on Mon, Wed, Fri. 02/10/22   Larey Dresser, MD  Methoxy PEG-Epoetin Beta (MIRCERA IJ) Mircera 03/17/21 03/16/22  [provider]  pantoprazole (PROTONIX) 40 MG tablet Take 1 tablet (40 mg total) by mouth 2 (two) times daily. 10/23/21   Hunsucker, Bonna Gains, MD  sertraline (ZOLOFT) 50 MG tablet Take 1 tablet (50 mg total) by mouth daily. 11/24/21   Shelda Pal, DO  Sodium Zirconium Cyclosilicate (LOKELMA PO) Take 1 packet by mouth 4 (four) times a week. Hold on dialysis days    [provider]  Tiotropium Bromide-Olodaterol (STIOLTO RESPIMAT) 2.5-2.5 MCG/ACT AERS Inhale 2 puffs by mouth into the lungs daily. 01/20/22   Hunsucker, Bonna Gains, MD    Physical Exam: Vitals:   02/21/22 1600 02/21/22 1630 02/21/22 1700 02/21/22 1900  BP: (!) 116/52 (!) 129/55 (!) 129/54   Pulse: 65 62 61   Resp: 15 13 10    Temp:      TempSrc:      SpO2: 96% 98% 99%   Weight:    61.6 kg   Constitutional: Sitting up in bed, NAD, calm, comfortable Eyes: EOMI, lids and conjunctivae normal ENMT: Mucous membranes are moist. Posterior pharynx clear of any exudate or lesions.Normal dentition.  Neck: normal, supple, no masses. Respiratory: Bibasilar inspiratory crackles otherwise clear to auscultation. Normal respiratory effort while on 2 L O2 via [place].  No accessory muscle use.  Cardiovascular: Regular rate and rhythm, no murmurs / rubs / gallops. No extremity edema. 2+ pedal pulses.  LUE aVF with palpable thrill Abdomen: no tenderness, no masses palpated.  Insulin pump in place. Musculoskeletal: no clubbing / cyanosis. No joint deformity upper and lower extremities. Good ROM, no contractures. Normal muscle tone.  Skin: no rashes, lesions, ulcers. No induration Neurologic: Sensation intact. Strength 5/5 in all 4.  Psychiatric: Alert and oriented x 3. Normal mood.   EKG: Personally reviewed. Normal sinus rhythm, LVH, incomplete RBBB, T wave inversions lateral leads, repolarization changes throughout, QTc 539.  When compared to prior, QTc is more prolonged.  Assessment/Plan Principal Problem:   Acute respiratory failure with hypoxia (HCC) Active Problems:   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 ([place])   Atherosclerotic heart disease of native coronary artery with other forms of angina pectoris (HCC)   COPD (chronic obstructive pulmonary disease) (HCC)   AF (paroxysmal atrial fibrillation) (HCC)   Chronic diastolic CHF (congestive heart failure) ([place])   Type 2 diabetes mellitus with chronic kidney disease on chronic dialysis, with long-term current use of insulin ([place])   Hyperlipidemia associated with type 2 diabetes mellitus ([place])   Hypertension associated with diabetes ([place])   Anemia of chronic disease   Kenneth [name] is a [place] speaking 66 y.o. male with medical history significant for ESRD on MWF HD, CAD s/p CABG and DES, HFpEF (EF 60-65% TTE 07/24/2021), PAF on Eliquis, COPD, T2DM using insulin pump, HTN, HLD, anemia of chronic disease, seizure disorder, depression, uveitis who is admitted with acute respiratory failure with hypoxia.  Assessment and Plan: * Acute respiratory failure with hypoxia (HCC) Lowest documented SPO2 87%.  Currently saturating well on 2 L O2 via [place].  Exertional dyspnea likely multifactorial.  Imaging with new small right and  trace left pleural effusions, bibasilar groundglass opacities with atelectasis and/or early pneumonia.  Patient has underlying COPD with chronic bronchitis and has not required supplemental O2 at home prior to admission.  He was given vancomycin and cefepime in the ED. -Narrow antibiotics to IV ceftriaxone and oral doxycycline -Follow blood cultures -Continue supplemental oxygen as needed, incentive spirometer  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 ([place]) Completed full HD session 6/9.  Will need nephrology  consult if remains in hospital for next dialysis due 6/12.  History of severe hyperkalemia for which he takes [facility] on nondialysis days, will hold for now with mild hypokalemia.  COPD (chronic obstructive pulmonary disease) (HCC) No wheezing on admission.  Continue Stiolto with albuterol as needed.  Atherosclerotic heart disease of native coronary artery with other forms of angina pectoris ([place]) Has intermittent exertional chest discomfort, denies chest pain on arrival to floor.  Has chronically elevated troponin which is downtrending and lower than previous levels.  Likely secondary to demand ischemia from hypoxia.  Follows with cardiology, Dr. Aundra Dubin.  Not on antiplatelet as he is on Eliquis. -Continue Eliquis 5 mg twice daily -Continue Coreg 6.25 mg twice daily -Continue Imdur 90 mg nondialysis days -Continue atorvastatin and Zetia  AF (paroxysmal atrial fibrillation) (HCC) Sinus rhythm with controlled rate on admission.  Continue Coreg and Eliquis.  Chronic diastolic CHF (congestive heart failure) (HCC) Euvolemic on admission.  Volume control with HD.  Type 2 diabetes mellitus with chronic kidney disease on chronic dialysis, with long-term current use of insulin (HCC) Continue insulin pump.  Anemia of chronic disease Hemoglobin stable at 8.8.  Hypertension associated with diabetes ([place]) Continue Coreg, hydralazine, Imdur.  Hyperlipidemia associated with type 2 diabetes mellitus  (HCC) Continue atorvastatin and Zetia.  DVT prophylaxis:  apixaban (ELIQUIS) tablet 5 mg   Code Status: Full code Family Communication: Discussed with patient Disposition Plan: From home and likely discharge to home pending clinical progress Consults called: None Severity of Illness: The appropriate patient status for this patient is INPATIENT. Inpatient status is judged to be reasonable and necessary in order to provide the required intensity of service to ensure the patient's safety. [name] presenting symptoms, physical exam findings, and initial radiographic and laboratory data in the context of their chronic comorbidities is felt to place them at high risk for further clinical deterioration. Furthermore, it is not anticipated that the patient will be medically stable for discharge from the hospital within 2 midnights of admission.   * I certify that at the point of admission it is my clinical judgment that the patient will require inpatient hospital care spanning beyond 2 midnights from the point of admission due to high intensity of service, high risk for further deterioration and high frequency of surveillance required.Zada Finders MD [facility]  If 7PM-7AM, please contact night-coverage [email or web address]  02/21/2022, 7:23 PM

## 2022-02-22 DIAGNOSIS — J9601 Acute respiratory failure with hypoxia: Secondary | ICD-10-CM | POA: Diagnosis not present

## 2022-02-22 LAB — GLUCOSE, CAPILLARY
Glucose-Capillary: 195 mg/dL — ABNORMAL HIGH (ref 70–99)
Glucose-Capillary: 198 mg/dL — ABNORMAL HIGH (ref 70–99)
Glucose-Capillary: 193 mg/dL — ABNORMAL HIGH (ref 70–99)

## 2022-02-22 LAB — CBC
HCT: 21 % — ABNORMAL LOW (ref 39.0–52.0)
Hemoglobin: 7.2 g/dL — ABNORMAL LOW (ref 13.0–17.0)
MCH: 32 pg (ref 26.0–34.0)
MCHC: 34.3 g/dL (ref 30.0–36.0)
MCV: 93.3 fL (ref 80.0–100.0)
Platelets: 157 10*3/uL (ref 150–400)
RBC: 2.25 MIL/uL — ABNORMAL LOW (ref 4.22–5.81)
RDW: 14.3 % (ref 11.5–15.5)
WBC: 5.5 10*3/uL (ref 4.0–10.5)
nRBC: 0 % (ref 0.0–0.2)

## 2022-02-22 LAB — PROCALCITONIN: Procalcitonin: 0.26 ng/mL

## 2022-02-22 LAB — BASIC METABOLIC PANEL
Anion gap: 12 (ref 5–15)
BUN: 26 mg/dL — ABNORMAL HIGH (ref 8–23)
CO2: 28 mmol/L (ref 22–32)
Calcium: 7.9 mg/dL — ABNORMAL LOW (ref 8.9–10.3)
Chloride: 91 mmol/L — ABNORMAL LOW (ref 98–111)
Creatinine, Ser: 6.89 mg/dL — ABNORMAL HIGH (ref 0.61–1.24)
GFR, Estimated: 8 mL/min — ABNORMAL LOW (ref >60)
Glucose, Bld: 196 mg/dL — ABNORMAL HIGH (ref 70–99)
Potassium: 3.4 mmol/L — ABNORMAL LOW (ref 3.5–5.1)
Sodium: 131 mmol/L — ABNORMAL LOW (ref 135–145)

## 2022-02-22 LAB — MRSA NEXT GEN BY PCR, NASAL: MRSA by PCR Next Gen: DETECTED — AB

## 2022-02-22 LAB — SARS CORONAVIRUS 2 BY RT PCR: SARS Coronavirus 2 by RT PCR: NEGATIVE

## 2022-02-22 LAB — HIV ANTIBODY (ROUTINE TESTING W REFLEX): HIV Screen 4th Generation wRfx: NONREACTIVE

## 2022-02-22 MED ORDER — MELATONIN 3 MG PO TABS
3.0000 mg | ORAL_TABLET | Freq: Every evening | ORAL | Status: DC | PRN
Start: 2022-02-22 — End: 2022-02-23
  Administered 2022-02-22 (×2): 3 mg via ORAL
  Filled 2022-02-22 (×2): qty 1

## 2022-02-22 MED ORDER — CHLORHEXIDINE GLUCONATE CLOTH 2 % EX PADS
6.0000 | MEDICATED_PAD | Freq: Every day | CUTANEOUS | Status: DC
  Administered 2022-02-23: 6 via TOPICAL

## 2022-02-22 MED ORDER — MUPIROCIN 2 % EX OINT
1.0000 "application " | TOPICAL_OINTMENT | Freq: Two times a day (BID) | CUTANEOUS | Status: DC
  Administered 2022-02-23 (×2): 1 via NASAL
  Filled 2022-02-22: qty 22

## 2022-02-22 MED ORDER — LIDOCAINE HCL (PF) 1 % IJ SOLN: 5.0000 mL | INTRAMUSCULAR | Status: DC | PRN

## 2022-02-22 MED ORDER — DOXERCALCIFEROL 4 MCG/2ML IV SOLN
2.0000 ug | INTRAVENOUS | Status: DC
  Administered 2022-02-23: 2 ug via INTRAVENOUS
  Filled 2022-02-22 (×2): qty 2

## 2022-02-22 MED ORDER — ALTEPLASE 2 MG IJ SOLR
2.0000 mg | Freq: Once | INTRAMUSCULAR | Status: DC | PRN
Start: 2022-02-22 — End: 2022-02-23

## 2022-02-22 MED ORDER — PENTAFLUOROPROP-TETRAFLUOROETH EX AERO: 1.0000 "application " | INHALATION_SPRAY | CUTANEOUS | Status: DC | PRN

## 2022-02-22 MED ORDER — LIDOCAINE-PRILOCAINE 2.5-2.5 % EX CREA: 1.0000 "application " | TOPICAL_CREAM | CUTANEOUS | Status: DC | PRN

## 2022-02-22 MED ORDER — HEPARIN SODIUM (PORCINE) 1000 UNIT/ML DIALYSIS: 1000.0000 [IU] | INTRAMUSCULAR | Status: DC | PRN

## 2022-02-22 NOTE — Progress Notes (Signed)
PROGRESS NOTE    [name]  YQM:[id number] DOB: 11/29/1955 DOA: 02/21/2022 PCP: Shelda Pal, DO   Brief Narrative:  HPI: Kenneth [name] is a [place] speaking 66 y.o. male with medical history significant for ESRD on MWF HD, CAD s/p CABG and DES, HFpEF (EF 60-65% TTE 07/24/2021), PAF on Eliquis, COPD, T2DM using insulin pump, HTN, HLD, anemia of chronic disease, seizure disorder, depression, uveitis who presented to the ED for evaluation of shortness of breath.  Patient presented to the ED with 7-10 days of progressive exertional dyspnea with associated intermittent right-sided chest discomfort.  He has chronic cough at baseline attributed to chronic bronchitis from prior smoking history.  Symptoms improved with rest.  He has been feeling generally weak.  He has had some subjective fevers and diaphoresis.  He does not make urine.   Patient states he did complete his full HD session this past Friday.  He uses an insulin pump for his type 1 diabetes which he was diagnosed around age 4.   ED Course  Labs/Imaging on admission: I have personally reviewed following labs and imaging studies.   Initial vitals showed BP 127/48, pulse 69, RR 20, temp 98.8 F, SPO2 97% on 2 L O2 via [place].  Lowest documented SPO2 87%.   Labs show WBC 6.7, hemoglobin 8.8, platelets 187,000, sodium 132, potassium 3.2, bicarb 31, BUN 23, creatinine 5.73, serum glucose 252, LFTs within normal limits, lipase 32, CK 81, lactic acid 1.2, D-dimer 0.78, ammonia 14.  Troponin 325 > 280.   SARS-CoV-2 PCR negative.  Single blood culture in process.   Portable chest x-ray negative for focal consolidation, edema, effusion.   CT head without contrast negative for acute intracranial hemorrhage, focal mass lesion, acute infarction.   CTA chest PE negative for evidence of PE.  Small right and trace left pleural effusion seen.  Emphysematous CAD changes noted.   CT abdomen/pelvis with contrast negative acute findings.   Dependent groundglass opacity in both lungs noted.   Patient was given 500 cc normal saline, IV vancomycin and cefepime.  The hospitalist service was consulted to admit for further evaluation and management.  Assessment & Plan:   Principal Problem:   Acute respiratory failure with hypoxia (HCC) Active Problems:   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 ([place])   Atherosclerotic heart disease of native coronary artery with other forms of angina pectoris (HCC)   COPD (chronic obstructive pulmonary disease) (HCC)   AF (paroxysmal atrial fibrillation) (HCC)   Chronic diastolic CHF (congestive heart failure) (HCC)   Type 2 diabetes mellitus with chronic kidney disease on chronic dialysis, with long-term current use of insulin ([place])   Hyperlipidemia associated with type 2 diabetes mellitus ([place])   Hypertension associated with diabetes ([place])   Anemia of chronic disease  Acute respiratory failure with hypoxia ([place]) secondary to healthcare associated pneumonia, POA: Lowest documented SPO2 87% required 2 L of oxygen but currently saturating well on room air. Patient has underlying COPD with chronic bronchitis and has not required supplemental O2 at home prior to admission.  Chest x-ray with possibility of pneumonia.  He is still complaining of shortness of breath but looks comfortable and saturating well on room air.  We will continue Rocephin and doxycycline.    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 ([place]) Completed full HD session 6/9.  Nephrology consulted.   COPD (chronic obstructive pulmonary disease) ([place]): Stable. No wheezing on admission.  Continue Stiolto with albuterol as needed.   Atherosclerotic heart disease of native coronary  artery with other forms of angina pectoris (HCC)/chest pain/demand ischemia: Has intermittent exertional chest discomfort, denies chest pain on arrival to floor.  Has chronically elevated troponin which is downtrending and lower than previous levels indicating demand ischemia.  Follows with  cardiology, Dr. Aundra Dubin.  Not on antiplatelet as he is on Eliquis. -Continue Eliquis 5 mg twice daily -Continue Coreg 6.25 mg twice daily -Continue Imdur 90 mg nondialysis days -Continue atorvastatin and Zetia   AF (paroxysmal atrial fibrillation) (HCC) Sinus rhythm with controlled rate on admission.  Continue Coreg and Eliquis.   Chronic diastolic CHF (congestive heart failure) (HCC) Euvolemic on admission.  Volume control with HD.   Type 2 diabetes mellitus with chronic kidney disease on chronic dialysis, with long-term current use of insulin (HCC) Continue insulin pump.   Anemia of chronic disease: His baseline hemoglobin is around 8.5, presented with 8.8 yesterday and down to 7.2 today.  We will monitor and repeat labs tomorrow morning, transfuse if less than 7.  Hypertension associated with diabetes [facility]): Very well controlled. Continue Coreg, hydralazine, Imdur.   Hyperlipidemia associated with type 2 diabetes mellitus (HCC) Continue atorvastatin and Zetia.  DVT prophylaxis:    Code Status: Full Code  Family Communication:  None present at bedside.  Plan of care discussed with patient in length and he/she verbalized understanding and agreed with it.  Status is: Inpatient Remains inpatient appropriate because: Needs IV antibiotics and monitoring for anemia.   Estimated body mass index is 25.15 kg/m as calculated from the following:   Height as of this encounter: 5\' 1"  (1.549 m).   Weight as of this encounter: 60.4 kg.    Nutritional Assessment: Body mass index is 25.15 kg/m.Marland Kitchen Seen by dietician.  I agree with the assessment and plan as outlined below: Nutrition Status:        . Skin Assessment: I have examined the patient's skin and I agree with the wound assessment as performed by the wound care RN as outlined below:    Consultants:  Nephrology  Procedures:  None  Antimicrobials:  Anti-infectives (From admission, onward)    Start     Dose/Rate Route  Frequency Ordered Stop   02/23/22 1200  vancomycin (VANCOCIN) 500 mg in sodium chloride 0.9 % 100 mL IVPB  Status:  Discontinued        500 mg 100 mL/hr over 60 Minutes Intravenous Every M-W-F (Hemodialysis) 02/21/22 1518 02/21/22 1900   02/22/22 1500  cefTRIAXone (ROCEPHIN) 2 g in sodium chloride 0.9 % 100 mL IVPB        2 g 200 mL/hr over 30 Minutes Intravenous Every 24 hours 02/21/22 1900 02/26/22 1459   02/21/22 2200  doxycycline (VIBRA-TABS) tablet 100 mg        100 mg Oral Every 12 hours 02/21/22 1900     02/21/22 1630  vancomycin (VANCOCIN) 500 mg in sodium chloride 0.9 % 100 mL IVPB       See Hyperspace for full Linked Orders Report.   500 mg 100 mL/hr over 60 Minutes Intravenous  Once 02/21/22 1518 02/21/22 2011   02/21/22 1530  vancomycin (VANCOCIN) IVPB 1000 mg/200 mL premix       See Hyperspace for full Linked Orders Report.   1,000 mg 200 mL/hr over 60 Minutes Intravenous  Once 02/21/22 1518 02/21/22 1830   02/21/22 1515  cefTRIAXone (ROCEPHIN) 1 g in sodium chloride 0.9 % 100 mL IVPB  Status:  Discontinued        1 g 200 mL/hr  over 30 Minutes Intravenous  Once 02/21/22 1506 02/21/22 1508   02/21/22 1515  doxycycline (VIBRA-TABS) tablet 100 mg  Status:  Discontinued        100 mg Oral  Once 02/21/22 1506 02/21/22 1508   02/21/22 1515  ceFEPIme (MAXIPIME) 1 g in sodium chloride 0.9 % 100 mL IVPB        1 g 200 mL/hr over 30 Minutes Intravenous  Once 02/21/22 1508 02/21/22 1612         Subjective: Patient seen and examined.  Although primarily [place] speaking but he can communicate fairly well in [place] as well.  He still complains of some chest pain with some shortness of breath.  No other complaint.  Objective: Vitals:   02/21/22 2021 02/21/22 2042 02/22/22 0517 02/22/22 0824  BP: (!) 129/53 (!) 110/48 (!) 115/51   Pulse: 61 62 62   Resp: 18 15 18    Temp: 98.8 F (37.1 C) 98.8 F (37.1 C) 98.5 F (36.9 C)   TempSrc: Oral Oral Oral   SpO2:  99% 97% 97%   Weight: 60.1 kg  60.4 kg   Height: 5\' 1"  (1.549 m)       Intake/Output Summary (Last 24 hours) at 02/22/2022 1144 Last data filed at 02/22/2022 0900 Gross per 24 hour  Intake 2017.83 ml  Output --  Net 2017.83 ml   Filed Weights   02/21/22 1900 02/21/22 2021 02/22/22 0517  Weight: 61.6 kg 60.1 kg 60.4 kg    Examination:  General exam: Appears calm and comfortable  Respiratory system: Clear to auscultation. Respiratory effort normal. Cardiovascular system: S1 & S2 heard, RRR. No JVD, murmurs, rubs, gallops or clicks. No pedal edema. Gastrointestinal system: Abdomen is nondistended, soft and nontender. No organomegaly or masses felt. Normal bowel sounds heard. Central nervous system: Alert and oriented. No focal neurological deficits. Extremities: Symmetric 5 x 5 power. Skin: No rashes, lesions or ulcers Psychiatry: Judgement and insight appear normal. Mood & affect appropriate.    Data Reviewed: I have personally reviewed following labs and imaging studies  CBC: Recent Labs  Lab 02/21/22 1250 02/21/22 1300 02/22/22 0344  WBC 6.7  --  5.5  HGB 8.8* 8.8* 7.2*  HCT 25.9* 26.0* 21.0*  MCV 92.8  --  93.3  PLT 187  --  810   Basic Metabolic Panel: Recent Labs  Lab 02/21/22 1250 02/21/22 1300 02/22/22 0344  NA 132* 131* 131*  K 3.2* 3.3* 3.4*  CL 91*  --  91*  CO2 31  --  28  GLUCOSE 252*  --  196*  BUN 23  --  26*  CREATININE 5.73*  --  6.89*  CALCIUM 8.3*  --  7.9*   GFR: Estimated Creatinine Clearance: 8 mL/min (A) (by C-G formula based on SCr of 6.89 mg/dL (H)). Liver Function Tests: Recent Labs  Lab 02/21/22 1250  AST 23  ALT 13  ALKPHOS 84  BILITOT 1.1  PROT 7.3  ALBUMIN 3.1*   Recent Labs  Lab 02/21/22 1250  LIPASE 32   Recent Labs  Lab 02/21/22 1404  AMMONIA 14   Coagulation Profile: No results for input(s): "INR", "PROTIME" in the last 168 hours. Cardiac Enzymes: Recent Labs  Lab 02/21/22 1250  CKTOTAL 81   BNP (last 3  results) No results for input(s): "PROBNP" in the last 8760 hours. HbA1C: No results for input(s): "HGBA1C" in the last 72 hours. CBG: Recent Labs  Lab 02/21/22 1247 02/21/22 2114 02/22/22 0309  GLUCAP 260*  217* 195*   Lipid Profile: No results for input(s): "CHOL", "HDL", "LDLCALC", "TRIG", "CHOLHDL", "LDLDIRECT" in the last 72 hours. Thyroid Function Tests: Recent Labs    02/21/22 1332  TSH 2.813   Anemia Panel: No results for input(s): "VITAMINB12", "FOLATE", "FERRITIN", "TIBC", "IRON", "RETICCTPCT" in the last 72 hours. Sepsis Labs: Recent Labs  Lab 02/21/22 1250 02/22/22 0344  PROCALCITON  --  0.26  LATICACIDVEN 1.2  --     Recent Results (from the past 240 hour(s))  SARS Coronavirus 2 by RT PCR (hospital order, performed in [facility] hospital lab) *cepheid single result test* Anterior Nasal Swab     Status: None   Collection Time: 02/21/22  1:00 PM   Specimen: Anterior Nasal Swab  Result Value Ref Range Status   SARS Coronavirus 2 by RT PCR NEGATIVE NEGATIVE Final    Comment: (NOTE) SARS-CoV-2 target nucleic acids are NOT DETECTED.  The SARS-CoV-2 RNA is generally detectable in upper and lower respiratory specimens during the acute phase of infection. The lowest concentration of SARS-CoV-2 viral copies this assay can detect is 250 copies / mL. A negative result does not preclude SARS-CoV-2 infection and should not be used as the sole basis for treatment or other patient management decisions.  A negative result may occur with improper specimen collection / handling, submission of specimen other than nasopharyngeal swab, presence of viral mutation(s) within the areas targeted by this assay, and inadequate number of viral copies (<250 copies / mL). A negative result must be combined with clinical observations, patient history, and epidemiological information.  Fact Sheet for Patients:   https://www.patel.info/  Fact Sheet for Healthcare  Providers: https://hall.com/  This test is not yet approved or  cleared by the [place] FDA and has been authorized for detection and/or diagnosis of SARS-CoV-2 by FDA under an Emergency Use Authorization (EUA).  This EUA will remain in effect (meaning this test can be used) for the duration of the COVID-19 declaration under Section 564(b)(1) of the Act, 21 U.S.C. section 360bbb-3(b)(1), unless the authorization is terminated or revoked sooner.  Performed at [facility], [address] [place], [place] [address]      Radiology Studies: CT ABDOMEN PELVIS W CONTRAST  Result Date: 02/21/2022 CLINICAL DATA:  Abdominal pain. EXAM: CT ABDOMEN AND PELVIS WITH CONTRAST TECHNIQUE: Multidetector CT imaging of the abdomen and pelvis was performed using the standard protocol following bolus administration of intravenous contrast. RADIATION DOSE REDUCTION: This exam was performed according to the departmental dose-optimization program which includes automated exposure control, adjustment of the mA and/or kV according to patient size and/or use of iterative reconstruction technique. CONTRAST:  113mL OMNIPAQUE IOHEXOL 350 MG/ML SOLN COMPARISON:  CT [id number] FINDINGS: Lower chest: Dependent ground-glass opacity in both lungs is likely related to atelectasis although multifocal pneumonia not excluded. Small right and tiny left pleural effusions evident. Hepatobiliary: No suspicious focal abnormality within the liver parenchyma. There is no evidence for gallstones, gallbladder wall thickening, or pericholecystic fluid. No intrahepatic or extrahepatic biliary dilation. Pancreas: No focal mass lesion. No dilatation of the main duct. No intraparenchymal cyst. No peripancreatic edema. Spleen: No splenomegaly. No focal mass lesion. Adrenals/Urinary Tract: No adrenal nodule or mass. Stable small cyst noted upper pole left kidney. No followup recommended. Right kidney  unremarkable. No evidence for hydroureter. The urinary bladder appears normal for the degree of distention. Stomach/Bowel: Stomach is nondistended. Duodenum is normally positioned as is the ligament of Treitz. No small bowel wall thickening. No  small bowel dilatation. The terminal ileum is normal. The appendix is normal. No gross colonic mass. No colonic wall thickening. Vascular/Lymphatic: There is moderate atherosclerotic calcification of the abdominal aorta without aneurysm. There is no gastrohepatic or hepatoduodenal ligament lymphadenopathy. No retroperitoneal or mesenteric lymphadenopathy. No pelvic sidewall lymphadenopathy. Reproductive: The prostate gland and seminal vesicles are unremarkable. Other: No intraperitoneal free fluid. Musculoskeletal: No worrisome lytic or sclerotic osseous abnormality. IMPRESSION: 1. No acute findings in the abdomen or pelvis. 2. Dependent ground-glass opacity in both lungs is likely related to atelectasis although multifocal pneumonia not excluded. Small right and tiny left pleural effusions evident. 3. Aortic Atherosclerosis (ICD10-I70.0). Electronically Signed   By: Misty Stanley M.D.   On: 02/21/2022 15:03   CT Angio Chest PE W and/or Wo Contrast  Result Date: 02/21/2022 CLINICAL DATA:  PE suspected, right-sided chest pain for 10 days EXAM: CT ANGIOGRAPHY CHEST WITH CONTRAST TECHNIQUE: Multidetector CT imaging of the chest was performed using the standard protocol during bolus administration of intravenous contrast. Multiplanar CT image reconstructions and MIPs were obtained to evaluate the vascular anatomy. RADIATION DOSE REDUCTION: This exam was performed according to the departmental dose-optimization program which includes automated exposure control, adjustment of the mA and/or kV according to patient size and/or use of iterative reconstruction technique. CONTRAST:  144mL OMNIPAQUE IOHEXOL 350 MG/ML SOLN COMPARISON:  10/21/2021 FINDINGS: Cardiovascular: Satisfactory  opacification of the pulmonary arteries to the segmental level. No evidence of pulmonary embolism. Cardiomegaly. Three-vessel coronary artery calcifications status post median sternotomy and CABG. No pericardial effusion. Mediastinum/Nodes: No enlarged mediastinal, hilar, or axillary lymph nodes. Thyroid gland, trachea, and esophagus demonstrate no significant findings. Lungs/Pleura: Small right, trace left pleural effusions. Moderate centrilobular and paraseptal emphysema. Upper Abdomen: No acute abnormality. Please see separately reported examination of the abdomen and pelvis. Musculoskeletal: No chest wall abnormality. No acute osseous findings. Review of the MIP images confirms the above findings. IMPRESSION: 1. Negative examination for pulmonary embolism. 2. Small right, trace left pleural effusions. 3. Moderate emphysema. 4. Coronary artery disease. Emphysema (ICD10-J43.9). Electronically Signed   By: Delanna Ahmadi M.D.   On: 02/21/2022 14:57   CT Head Wo Contrast  Result Date: 02/21/2022 CLINICAL DATA:  Syncope. EXAM: CT HEAD WITHOUT CONTRAST TECHNIQUE: Contiguous axial images were obtained from the base of the skull through the vertex without intravenous contrast. RADIATION DOSE REDUCTION: This exam was performed according to the departmental dose-optimization program which includes automated exposure control, adjustment of the mA and/or kV according to patient size and/or use of iterative reconstruction technique. COMPARISON:  12/22/2020 FINDINGS: Brain: No acute intracranial hemorrhage. No focal mass lesion. No CT evidence of acute infarction. No midline shift or mass effect. No hydrocephalus. Basilar cisterns are patent. There are periventricular and subcortical white matter hypodensities. Generalized cortical atrophy. Vascular: No hyperdense vessel or unexpected calcification. Skull: Normal. Negative for fracture or focal lesion. Sinuses/Orbits: Paranasal sinuses and mastoid air cells are clear.  Orbits are clear. Other: None. IMPRESSION: 1. No change from comparison exam. 2. Atrophy and subcortical white matter disease. Electronically Signed   By: Suzy Bouchard M.D.   On: 02/21/2022 14:48   DG Chest Port 1 View  Result Date: 02/21/2022 CLINICAL DATA:  Short of breath EXAM: PORTABLE CHEST 1 VIEW COMPARISON:  None Available. FINDINGS: Sternotomy wires overlie normal cardiac silhouette. No effusion, infiltrate or pneumothorax. Normal pulmonary vasculature. Atherosclerotic calcification of the aorta. IMPRESSION: No acute cardiopulmonary process. Electronically Signed   By: Suzy Bouchard M.D.   On: 02/21/2022 13:21  Scheduled Meds:  apixaban  5 mg Oral BID   arformoterol  15 mcg Nebulization BID   And   umeclidinium bromide  1 puff Inhalation Daily   atorvastatin  80 mg Oral Daily   calcium acetate  667 mg Oral TID WC   carvedilol  6.25 mg Oral BID WC   doxycycline  100 mg Oral Q12H   ezetimibe  10 mg Oral Daily   hydrALAZINE  100 mg Oral TID   insulin pump   Subcutaneous TID WC, HS, 0200   isosorbide mononitrate  90 mg Oral Daily   pantoprazole  40 mg Oral BID   sertraline  50 mg Oral Daily   sodium chloride flush  3 mL Intravenous Q12H   Continuous Infusions:  cefTRIAXone (ROCEPHIN)  IV       LOS: 1 day   Darliss Cheney, MD [facility]  02/22/2022, 11:44 AM   *Please note that this is a verbal dictation therefore any spelling or grammatical errors are due to the "[place] [employer]" system interpretation.  Please page via [place] and do not message via secure chat for urgent patient care matters. Secure chat can be used for non urgent patient care matters.  How to contact the [facility] Attending or Consulting provider [place] or covering provider during after hours [place], for this patient?  Check the care team in [facility] and look for a) attending/consulting [facility] provider listed and b) the [facility] team listed. Page or secure chat [phone]. Log into [email or web address] and use [facility]'s universal password to access. If you do not have the password, please contact the hospital operator. Locate the [facility] provider you are looking for under [facility] and page to a number that you can be directly reached. If you still have difficulty reaching the provider, please page the [facility] (Director on Call) for the Hospitalists listed on amion for assistance.

## 2022-02-22 NOTE — Consult Note (Addendum)
[facility] Renal Consultation Note    Indication for Consultation:  Management of ESRD/hemodialysis; anemia, hypertension/volume and secondary hyperparathyroidism  OVZ:CHYIFOYD, Crosby Oyster, DO  HPI: Kenneth [name] is a 66 y.o. male with ESRD on HD MWF at [facility]. His past medical history is significant for CAD s/p CABG and DES, HFpEF (EF 60-65% TTE 07/24/2021), PAF on Eliquis, COPD, T2DM using insulin pump, HTN, HLD, anemia of chronic disease, seizure disorder, depression, and uveitis. Patient's presents to the ED for worsening SOB X 7 days. He has been compliant with his outpatient HD. Last HD on 6/9 and received full treatment. CXR and Head Ct (-). CTA chest showed small right and trace left pleural effusion. Currently on ABXs for PNA w/u. Blood cultures pending and COVID test ordered today. Seen and examined patient at bedside. His wife also at bedside. Lap-top interpreter was utilized during visit. He denies SOB but says his chest feels heavy. He appears comfortable, on 1L [place], and Bps stable. Labs include: SrCr 6.89, BUN 26, K+ 3.4, Na 131, Ca 7.9, troponin 280, and Hgb 7.2. Plan for HD tomorrow AM per his usual schedule.  Kenneth [name] is also complaining of chest pain radiating his neck R>L.  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   type 1 DM   ESRD on hemodialysis ([place])    "MWF; [facility]" (03/30/2018)   Gastroesophageal reflux disease 08/18/2016   Hyperlipidemia    Hypertension    Ischemic rest pain of lower extremity 02/06/2015   Keratoma 02/27/2015   Metatarsal deformity 02/27/2015   PAF (paroxysmal atrial fibrillation) (HCC)    Pronation deformity of both feet 02/27/2015    Past Surgical History:  Procedure Laterality Date   A/V FISTULAGRAM Left 03/06/2021   Procedure: A/V FISTULAGRAM;  Surgeon: Marty Heck, MD;  Location: [place] [facility];  Service: Cardiovascular;  Laterality: Left;   AV FISTULA PLACEMENT Left 06/22/2016   Procedure: ARTERIOVENOUS (AV) FISTULA CREATION LEFT UPPER ARM;  Surgeon: Rosetta Posner, MD;  Location: [place];  Service: Vascular;  Laterality: Left;   BIOPSY  12/19/2020   Procedure: BIOPSY;  Surgeon: Ladene Artist, MD;  Location: [place];  Service: Endoscopy;;   CARDIAC CATHETERIZATION N/A 05/31/2016   Procedure: Left Heart Cath and Coronary Angiography;  Surgeon: Jettie Booze, MD;  Location: [place] [facility];  Service: Cardiovascular;  Laterality: N/A;   CARDIAC CATHETERIZATION N/A 05/31/2016   Procedure: Right Heart Cath;  Surgeon: Jettie Booze, MD;  Location: [place] [facility];  Service: Cardiovascular;  Laterality: N/A;   CARDIAC CATHETERIZATION N/A 05/31/2016   Procedure: IABP Insertion;  Surgeon: Jettie Booze, MD;  Location: [place] [facility];  Service: Cardiovascular;  Laterality: N/A;   COLONOSCOPY WITH PROPOFOL N/A 12/20/2020   Procedure: COLONOSCOPY WITH PROPOFOL;  Surgeon: Mauri Pole, MD;  Location: [place] [facility];  Service: Endoscopy;  Laterality: N/A;   CORONARY ARTERY BYPASS GRAFT N/A 06/05/2016   Procedure: CORONARY ARTERY BYPASS GRAFTING (CABG) x3 LIMA to LAD -SVG to OM -SVG to RCA;  Surgeon: Ivin Poot, MD;  Location: [place];  Service: Open Heart Surgery;  Laterality: N/A;  CORONARY STENT INTERVENTION N/A 07/07/2017   Procedure: CORONARY STENT INTERVENTION;  Surgeon: Wellington Hampshire, MD;  Location: [place] [facility];  Service: Cardiovascular;  Laterality: N/A;   CORONARY STENT INTERVENTION N/A 03/30/2018   Procedure: CORONARY STENT INTERVENTION;  Surgeon: Martinique, Peter M, MD;  Location: [place] [facility];  Service: Cardiovascular;  Laterality: N/A;    ESOPHAGOGASTRODUODENOSCOPY (EGD) WITH PROPOFOL N/A 12/19/2020   Procedure: ESOPHAGOGASTRODUODENOSCOPY (EGD) WITH PROPOFOL;  Surgeon: Ladene Artist, MD;  Location: [facility];  Service: Endoscopy;  Laterality: N/A;   GIVENS CAPSULE STUDY N/A 12/20/2020   Procedure: GIVENS CAPSULE STUDY;  Surgeon: Mauri Pole, MD;  Location: [place];  Service: Endoscopy;  Laterality: N/A;   INSERTION OF DIALYSIS CATHETER N/A 06/05/2016   Procedure: INSERTION OF DIALYSIS/trialysis CATHETER;  Surgeon: Ivin Poot, MD;  Location: [place];  Service: Vascular;  Laterality: N/A;   INSERTION OF DIALYSIS CATHETER Right 06/13/2016   Procedure: INSERTION OF DIALYSIS CATHETER RIGHT INTERNAL JUGULAR;  Surgeon: Conrad Edmonton, MD;  Location: [place];  Service: Vascular;  Laterality: Right;   INSERTION OF DIALYSIS CATHETER Right 09/07/2019   Procedure: Insertion Of Dialysis Catheter;  Surgeon: Rosetta Posner, MD;  Location: [facility] OR;  Service: Vascular;  Laterality: Right;   INTRAOPERATIVE TRANSESOPHAGEAL ECHOCARDIOGRAM N/A 06/05/2016   Procedure: INTRAOPERATIVE TRANSESOPHAGEAL ECHOCARDIOGRAM;  Surgeon: Ivin Poot, MD;  Location: [place];  Service: Open Heart Surgery;  Laterality: N/A;   LEFT HEART CATH AND CORS/GRAFTS ANGIOGRAPHY N/A 11/03/2016   Procedure: Left Heart Cath and Cors/Grafts Angiography;  Surgeon: Troy Sine, MD;  Location: [place] [facility];  Service: Cardiovascular;  Laterality: N/A;   LEFT HEART CATH AND CORS/GRAFTS ANGIOGRAPHY N/A 07/07/2017   Procedure: LEFT HEART CATH AND CORS/GRAFTS ANGIOGRAPHY;  Surgeon: Larey Dresser, MD;  Location: [place] [facility];  Service: Cardiovascular;  Laterality: N/A;   LEFT HEART CATH AND CORS/GRAFTS ANGIOGRAPHY N/A 03/30/2018   Procedure: LEFT HEART CATH AND CORS/GRAFTS ANGIOGRAPHY;  Surgeon: Martinique, Peter M, MD;  Location: [place] [facility];  Service: Cardiovascular;  Laterality: N/A;   LEFT HEART CATH AND CORS/GRAFTS ANGIOGRAPHY N/A 04/17/2019   Procedure: LEFT  HEART CATH AND CORS/GRAFTS ANGIOGRAPHY;  Surgeon: Larey Dresser, MD;  Location: [place] [facility];  Service: Cardiovascular;  Laterality: N/A;   PERIPHERAL VASCULAR INTERVENTION Left 03/06/2021   Procedure: PERIPHERAL VASCULAR INTERVENTION;  Surgeon: Marty Heck, MD;  Location: [place] [facility];  Service: Cardiovascular;  Laterality: Left;   POLYPECTOMY  12/20/2020   Procedure: POLYPECTOMY;  Surgeon: Mauri Pole, MD;  Location: [place];  Service: Endoscopy;;   REVISION OF ARTERIOVENOUS GORETEX GRAFT Left 57/84/6962   Procedure: PLICATION OF ANEURYSM OF ARTERIOVENOUS FISTULA  LEFT ARM;  Surgeon: Rosetta Posner, MD;  Location: [place];  Service: Vascular;  Laterality: Left;   REVISON OF ARTERIOVENOUS FISTULA Left 09/07/2019   Procedure: REVISON OF LEFT UPPER ARM ARTERIOVENOUS FISTULA;  Surgeon: Rosetta Posner, MD;  Location: [place];  Service: Vascular;  Laterality: Left;   RIGHT/LEFT HEART CATH AND CORONARY ANGIOGRAPHY N/A 01/03/2021   Procedure: RIGHT/LEFT HEART CATH AND CORONARY ANGIOGRAPHY;  Surgeon: Larey Dresser, MD;  Location: [place] [facility];  Service: Cardiovascular;  Laterality: N/A;   Family History  Problem Relation Age of Onset   CAD Father    Colon cancer Father    Diabetes Brother    Social History:  reports that he quit smoking about 9 years ago. His smoking  use included cigarettes. He has a 52.50 pack-year smoking history. He has never used smokeless tobacco. He reports that he does not drink alcohol and does not use drugs. No Known Allergies Prior to Admission medications   Medication Sig Start Date End Date Taking? Authorizing Provider  apixaban (ELIQUIS) 5 MG TABS tablet Take 1 tablet by mouth 2 times daily 10/17/21   Larey Dresser, MD  atorvastatin (LIPITOR) 80 MG tablet Take 1 tablet (80 mg total) by mouth daily. 10/16/21   Larey Dresser, MD  calcium acetate (PHOSLO) 667 MG capsule Take 667 mg by mouth 3 (three) times daily with meals.  07/09/17    [provider]  carvedilol (COREG) 6.25 MG tablet Take 1 tablet (6.25 mg total) by mouth 2 (two) times daily with a meal. Hold on the mornings of dialysis. 02/10/22   Shelda Pal, DO  Continuous Blood Gluc Sensor (FREESTYLE LIBRE 2 SENSOR) MISC 1 Device by Does not apply route every 14 (fourteen) days. 10/28/21   Shamleffer, Melanie Crazier, MD  diclofenac Sodium (VOLTAREN) 1 % GEL Apply 4 grams topically 4 (four) times daily. 09/16/21   Shelda Pal, DO  ezetimibe (ZETIA) 10 MG tablet Take 1 tablet by mouth daily 05/20/21   Larey Dresser, MD  glucose blood [facility] VERIO) test strip 1 each by Other route in the morning, at noon, in the evening, and at bedtime. Use as instructed 01/01/22   Shamleffer, Melanie Crazier, MD  hydrALAZINE (APRESOLINE) 100 MG tablet Take 1 tablet (100 mg total) by mouth 3 (three) times daily. DO NOT TAKE AM AND NOON DOSES ON HD DAYS 09/16/21   Shelda Pal, DO  Insulin Human (INSULIN PUMP) SOLN Inject 1 each into the skin continuous. NOVOLOG - 20 units    [provider]  Insulin Lispro-aabc (LYUMJEV) 100 UNIT/ML SOLN Max Daily 30 units per insulin pump 01/06/22   Shamleffer, Melanie Crazier, MD  Insulin Pen Needle (PEN NEEDLES) 32G X 6 MM MISC Use as directed 3 times daily 12/18/21   Shamleffer, Melanie Crazier, MD  isosorbide mononitrate (IMDUR) 60 MG 24 hr tablet Take 1&1/2 tablets (90 mg total) by mouth daily. Hold on days of dialysis on Mon, Wed, Fri. 02/10/22   Larey Dresser, MD  Methoxy PEG-Epoetin Beta (MIRCERA IJ) Mircera 03/17/21 03/16/22  [provider]  pantoprazole (PROTONIX) 40 MG tablet Take 1 tablet (40 mg total) by mouth 2 (two) times daily. 10/23/21   Hunsucker, Bonna Gains, MD  sertraline (ZOLOFT) 50 MG tablet Take 1 tablet (50 mg total) by mouth daily. 11/24/21   Shelda Pal, DO  Sodium Zirconium Cyclosilicate (LOKELMA PO) Take 1 packet by mouth 4 (four) times a week. Hold on dialysis days     [provider]  Tiotropium Bromide-Olodaterol (STIOLTO RESPIMAT) 2.5-2.5 MCG/ACT AERS Inhale 2 puffs by mouth into the lungs daily. 01/20/22   Hunsucker, Bonna Gains, MD   Current Facility-Administered Medications  Medication Dose Route Frequency Provider Last Rate Last Admin   acetaminophen (TYLENOL) tablet 650 mg  650 mg Oral Q6H PRN Lenore Cordia, MD       Or   acetaminophen (TYLENOL) suppository 650 mg  650 mg Rectal Q6H PRN Lenore Cordia, MD       albuterol (PROVENTIL) (2.5 MG/3ML) 0.083% nebulizer solution 2.5 mg  2.5 mg Nebulization Q6H PRN Pham, Minh Q, RPH-CPP       apixaban (ELIQUIS) tablet 5 mg  5 mg Oral BID Zada Finders  R, MD   5 mg at 02/22/22 0928   arformoterol (BROVANA) nebulizer solution 15 mcg  15 mcg Nebulization BID Lenore Cordia, MD   15 mcg at 02/22/22 8527   And   umeclidinium bromide (INCRUSE ELLIPTA) 62.5 MCG/ACT 1 puff  1 puff Inhalation Daily Lenore Cordia, MD   1 puff at 02/22/22 0820   atorvastatin (LIPITOR) tablet 80 mg  80 mg Oral Daily Lenore Cordia, MD   80 mg at 02/22/22 0928   calcium acetate (PHOSLO) capsule 667 mg  667 mg Oral TID WC Zada Finders R, MD   667 mg at 02/22/22 1226   carvedilol (COREG) tablet 6.25 mg  6.25 mg Oral BID WC Zada Finders R, MD   6.25 mg at 02/22/22 0927   cefTRIAXone (ROCEPHIN) 2 g in sodium chloride 0.9 % 100 mL IVPB  2 g Intravenous Q24H Zada Finders R, MD       doxycycline (VIBRA-TABS) tablet 100 mg  100 mg Oral Q12H Zada Finders R, MD   100 mg at 02/22/22 7824   ezetimibe (ZETIA) tablet 10 mg  10 mg Oral Daily Zada Finders R, MD   10 mg at 02/22/22 2353   hydrALAZINE (APRESOLINE) tablet 100 mg  100 mg Oral TID Zada Finders R, MD   100 mg at 02/22/22 0928   insulin pump   Subcutaneous TID WC, HS, 0200 Lenore Cordia, MD   1 each at 02/22/22 0929   isosorbide mononitrate (IMDUR) 24 hr tablet 90 mg  90 mg Oral Daily Zada Finders R, MD   90 mg at 02/22/22 6144   melatonin tablet 3 mg  3 mg Oral QHS PRN  Etta Quill, DO   3 mg at 02/22/22 0123   pantoprazole (PROTONIX) EC tablet 40 mg  40 mg Oral BID Lenore Cordia, MD   40 mg at 02/22/22 3154   prochlorperazine (COMPAZINE) injection 10 mg  10 mg Intravenous Q6H PRN Lenore Cordia, MD       sertraline (ZOLOFT) tablet 50 mg  50 mg Oral Daily Zada Finders R, MD   50 mg at 02/22/22 0928   sodium chloride flush (NS) 0.9 % injection 3 mL  3 mL Intravenous Q12H Zada Finders R, MD   3 mL at 02/22/22 0936   Labs: Basic Metabolic Panel: Recent Labs  Lab 02/21/22 1250 02/21/22 1300 02/22/22 0344  NA 132* 131* 131*  K 3.2* 3.3* 3.4*  CL 91*  --  91*  CO2 31  --  28  GLUCOSE 252*  --  196*  BUN 23  --  26*  CREATININE 5.73*  --  6.89*  CALCIUM 8.3*  --  7.9*   Liver Function Tests: Recent Labs  Lab 02/21/22 1250  AST 23  ALT 13  ALKPHOS 84  BILITOT 1.1  PROT 7.3  ALBUMIN 3.1*   Recent Labs  Lab 02/21/22 1250  LIPASE 32   Recent Labs  Lab 02/21/22 1404  AMMONIA 14   CBC: Recent Labs  Lab 02/21/22 1250 02/21/22 1300 02/22/22 0344  WBC 6.7  --  5.5  HGB 8.8* 8.8* 7.2*  HCT 25.9* 26.0* 21.0*  MCV 92.8  --  93.3  PLT 187  --  157   Cardiac Enzymes: Recent Labs  Lab 02/21/22 1250  CKTOTAL 81   CBG: Recent Labs  Lab 02/21/22 1247 02/21/22 2114 02/22/22 0309  GLUCAP 260* 217* 195*   Iron Studies: No results for input(s): "IRON", "  TIBC", "TRANSFERRIN", "FERRITIN" in the last 72 hours. Studies/Results: CT ABDOMEN PELVIS W CONTRAST  Result Date: 02/21/2022 CLINICAL DATA:  Abdominal pain. EXAM: CT ABDOMEN AND PELVIS WITH CONTRAST TECHNIQUE: Multidetector CT imaging of the abdomen and pelvis was performed using the standard protocol following bolus administration of intravenous contrast. RADIATION DOSE REDUCTION: This exam was performed according to the departmental dose-optimization program which includes automated exposure control, adjustment of the mA and/or kV according to patient size and/or use of  iterative reconstruction technique. CONTRAST:  161mL OMNIPAQUE IOHEXOL 350 MG/ML SOLN COMPARISON:  CT [id number] FINDINGS: Lower chest: Dependent ground-glass opacity in both lungs is likely related to atelectasis although multifocal pneumonia not excluded. Small right and tiny left pleural effusions evident. Hepatobiliary: No suspicious focal abnormality within the liver parenchyma. There is no evidence for gallstones, gallbladder wall thickening, or pericholecystic fluid. No intrahepatic or extrahepatic biliary dilation. Pancreas: No focal mass lesion. No dilatation of the main duct. No intraparenchymal cyst. No peripancreatic edema. Spleen: No splenomegaly. No focal mass lesion. Adrenals/Urinary Tract: No adrenal nodule or mass. Stable small cyst noted upper pole left kidney. No followup recommended. Right kidney unremarkable. No evidence for hydroureter. The urinary bladder appears normal for the degree of distention. Stomach/Bowel: Stomach is nondistended. Duodenum is normally positioned as is the ligament of Treitz. No small bowel wall thickening. No small bowel dilatation. The terminal ileum is normal. The appendix is normal. No gross colonic mass. No colonic wall thickening. Vascular/Lymphatic: There is moderate atherosclerotic calcification of the abdominal aorta without aneurysm. There is no gastrohepatic or hepatoduodenal ligament lymphadenopathy. No retroperitoneal or mesenteric lymphadenopathy. No pelvic sidewall lymphadenopathy. Reproductive: The prostate gland and seminal vesicles are unremarkable. Other: No intraperitoneal free fluid. Musculoskeletal: No worrisome lytic or sclerotic osseous abnormality. IMPRESSION: 1. No acute findings in the abdomen or pelvis. 2. Dependent ground-glass opacity in both lungs is likely related to atelectasis although multifocal pneumonia not excluded. Small right and tiny left pleural effusions evident. 3. Aortic Atherosclerosis (ICD10-I70.0). Electronically Signed    By: Misty Stanley M.D.   On: 02/21/2022 15:03   CT Angio Chest PE W and/or Wo Contrast  Result Date: 02/21/2022 CLINICAL DATA:  PE suspected, right-sided chest pain for 10 days EXAM: CT ANGIOGRAPHY CHEST WITH CONTRAST TECHNIQUE: Multidetector CT imaging of the chest was performed using the standard protocol during bolus administration of intravenous contrast. Multiplanar CT image reconstructions and MIPs were obtained to evaluate the vascular anatomy. RADIATION DOSE REDUCTION: This exam was performed according to the departmental dose-optimization program which includes automated exposure control, adjustment of the mA and/or kV according to patient size and/or use of iterative reconstruction technique. CONTRAST:  169mL OMNIPAQUE IOHEXOL 350 MG/ML SOLN COMPARISON:  10/21/2021 FINDINGS: Cardiovascular: Satisfactory opacification of the pulmonary arteries to the segmental level. No evidence of pulmonary embolism. Cardiomegaly. Three-vessel coronary artery calcifications status post median sternotomy and CABG. No pericardial effusion. Mediastinum/Nodes: No enlarged mediastinal, hilar, or axillary lymph nodes. Thyroid gland, trachea, and esophagus demonstrate no significant findings. Lungs/Pleura: Small right, trace left pleural effusions. Moderate centrilobular and paraseptal emphysema. Upper Abdomen: No acute abnormality. Please see separately reported examination of the abdomen and pelvis. Musculoskeletal: No chest wall abnormality. No acute osseous findings. Review of the MIP images confirms the above findings. IMPRESSION: 1. Negative examination for pulmonary embolism. 2. Small right, trace left pleural effusions. 3. Moderate emphysema. 4. Coronary artery disease. Emphysema (ICD10-J43.9). Electronically Signed   By: Delanna Ahmadi M.D.   On: 02/21/2022 14:57   CT Head Wo  Contrast  Result Date: 02/21/2022 CLINICAL DATA:  Syncope. EXAM: CT HEAD WITHOUT CONTRAST TECHNIQUE: Contiguous axial images were obtained  from the base of the skull through the vertex without intravenous contrast. RADIATION DOSE REDUCTION: This exam was performed according to the departmental dose-optimization program which includes automated exposure control, adjustment of the mA and/or kV according to patient size and/or use of iterative reconstruction technique. COMPARISON:  12/22/2020 FINDINGS: Brain: No acute intracranial hemorrhage. No focal mass lesion. No CT evidence of acute infarction. No midline shift or mass effect. No hydrocephalus. Basilar cisterns are patent. There are periventricular and subcortical white matter hypodensities. Generalized cortical atrophy. Vascular: No hyperdense vessel or unexpected calcification. Skull: Normal. Negative for fracture or focal lesion. Sinuses/Orbits: Paranasal sinuses and mastoid air cells are clear. Orbits are clear. Other: None. IMPRESSION: 1. No change from comparison exam. 2. Atrophy and subcortical white matter disease. Electronically Signed   By: Suzy Bouchard M.D.   On: 02/21/2022 14:48   DG Chest Port 1 View  Result Date: 02/21/2022 CLINICAL DATA:  Short of breath EXAM: PORTABLE CHEST 1 VIEW COMPARISON:  None Available. FINDINGS: Sternotomy wires overlie normal cardiac silhouette. No effusion, infiltrate or pneumothorax. Normal pulmonary vasculature. Atherosclerotic calcification of the aorta. IMPRESSION: No acute cardiopulmonary process. Electronically Signed   By: Suzy Bouchard M.D.   On: 02/21/2022 13:21     Physical Exam: Vitals:   02/21/22 2042 02/22/22 0517 02/22/22 0824 02/22/22 1200  BP: (!) 110/48 (!) 115/51  (!) 135/48  Pulse: 62 62  65  Resp: 15 18    Temp: 98.8 F (37.1 C) 98.5 F (36.9 C)  98.5 F (36.9 C)  TempSrc: Oral Oral  Oral  SpO2: 99% 97% 97% 92%  Weight:  60.4 kg    Height:         General: WDWN; On 1L [place]; NAD Head: Sclera anicteric  NECK:  + tenderness along the right side of his neck, no masses appreciated, no erythema Lungs: CTA anteriorly  and laterally. No wheeze, rales or rhonchi. Breathing is unlabored. Heart: RRR. No murmur, rubs or gallops.  Abdomen: soft and non-tender Lower extremities: no edema BLLE Neuro: AAOx3. Moves all extremities spontaneously. Dialysis Access: L AVF (+) B/T  Dialysis Orders:  MWF - [place] ([facility]) 3hrs69min, BFR 400, DFR 500,  EDW 58kg, 2K/ 2Ca No heparin bolus Aranesp 160 mcg weekly (dose recently raised) - last 02/18/22 (152mcg) Hectorol 30mcg IV qHD-last 6/9 Sensipar 90mg  with HD-last 6/9  Assessment/Plan: Acute respiratory failure with hypoxia-2nd PNA; on ABXs; BC results pending; COVID test ordered; managed by primary ESRD - on HD MWF. Plan for HD tomorrow morning per his usual schedule Hypertension/volume  - Euvolemic on exam and Bps stable. Has been compliant with outpatient HD and reaching his EDW. Continue UF here as tolerated. Anemia of CKD - Hgb 7.2; Aranesp dose recently raised in outpatient and last given on 6/7. Next dose due 6/14. Monitor trends. Transfuse if Hgb less than 7. Secondary Hyperparathyroidism - will resume Hectorol and hold Sensipar for now. Checking PO4 in AM Nutrition - Renal diet with fluid restriction  Tobie Poet, NP [facility] 02/22/2022, 2:00 PM    I have seen and examined this patient and agree with plan and assessment in the above note with renal recommendations/intervention highlighted. Pt is breathing comfortably.  Will plan for HD tomorrow and UF as tolerated.  Governor Rooks Hashim Eichhorst,MD 02/22/2022 2:34 PM

## 2022-02-23 ENCOUNTER — Other Ambulatory Visit (HOSPITAL_BASED_OUTPATIENT_CLINIC_OR_DEPARTMENT_OTHER): Payer: Self-pay

## 2022-02-23 LAB — RENAL FUNCTION PANEL
Albumin: 2.3 g/dL — ABNORMAL LOW (ref 3.5–5.0)
Anion gap: 11 (ref 5–15)
BUN: 35 mg/dL — ABNORMAL HIGH (ref 8–23)
CO2: 25 mmol/L (ref 22–32)
Calcium: 8.1 mg/dL — ABNORMAL LOW (ref 8.9–10.3)
Chloride: 89 mmol/L — ABNORMAL LOW (ref 98–111)
Creatinine, Ser: 8.68 mg/dL — ABNORMAL HIGH (ref 0.61–1.24)
GFR, Estimated: 6 mL/min — ABNORMAL LOW (ref >60)
Glucose, Bld: 159 mg/dL — ABNORMAL HIGH (ref 70–99)
Phosphorus: 2.5 mg/dL (ref 2.5–4.6)
Potassium: 3.4 mmol/L — ABNORMAL LOW (ref 3.5–5.1)
Sodium: 125 mmol/L — ABNORMAL LOW (ref 135–145)

## 2022-02-23 LAB — GLUCOSE, CAPILLARY
Glucose-Capillary: 172 mg/dL — ABNORMAL HIGH (ref 70–99)
Glucose-Capillary: 181 mg/dL — ABNORMAL HIGH (ref 70–99)
Glucose-Capillary: 182 mg/dL — ABNORMAL HIGH (ref 70–99)

## 2022-02-23 LAB — CBC
HCT: 22.3 % — ABNORMAL LOW (ref 39.0–52.0)
Hemoglobin: 7.6 g/dL — ABNORMAL LOW (ref 13.0–17.0)
MCH: 31.4 pg (ref 26.0–34.0)
MCHC: 34.1 g/dL (ref 30.0–36.0)
MCV: 92.1 fL (ref 80.0–100.0)
Platelets: 188 10*3/uL (ref 150–400)
RBC: 2.42 MIL/uL — ABNORMAL LOW (ref 4.22–5.81)
RDW: 14.2 % (ref 11.5–15.5)
WBC: 6.1 10*3/uL (ref 4.0–10.5)
nRBC: 0 % (ref 0.0–0.2)

## 2022-02-23 LAB — HEPATITIS C ANTIBODY: HCV Ab: NONREACTIVE

## 2022-02-23 LAB — HEPATITIS B SURFACE ANTIBODY,QUALITATIVE: Hep B S Ab: REACTIVE — AB

## 2022-02-23 LAB — HEPATITIS B CORE ANTIBODY, TOTAL: Hep B Core Total Ab: REACTIVE — AB

## 2022-02-23 LAB — LEVETIRACETAM LEVEL: Levetiracetam Lvl: <2 ug/mL — ABNORMAL LOW (ref 10.0–40.0)

## 2022-02-23 LAB — HEPATITIS B SURFACE ANTIGEN: Hepatitis B Surface Ag: NONREACTIVE

## 2022-02-23 MED ORDER — AMOXICILLIN-POT CLAVULANATE 500-125 MG PO TABS
1.0000 | ORAL_TABLET | Freq: Every day | ORAL | 0 refills | Status: DC
  Filled 2022-02-23: qty 3, 3d supply, fill #0

## 2022-02-23 MED ORDER — CEFDINIR 300 MG PO CAPS
300.0000 mg | ORAL_CAPSULE | ORAL | Status: DC
  Administered 2022-02-23: 300 mg via ORAL
  Filled 2022-02-23 (×2): qty 1

## 2022-02-23 MED ORDER — CEFDINIR 300 MG PO CAPS
300.0000 mg | ORAL_CAPSULE | ORAL | 0 refills | Status: DC
Start: 2022-02-23 — End: 2022-02-23
  Filled 2022-02-23: qty 3, 6d supply, fill #0

## 2022-02-23 MED ORDER — PREDNISONE 20 MG PO TABS
40.0000 mg | ORAL_TABLET | Freq: Every day | ORAL | 0 refills | Status: DC
  Filled 2022-02-23: qty 6, 3d supply, fill #0

## 2022-02-23 MED ORDER — DOXYCYCLINE HYCLATE 100 MG PO TABS
100.0000 mg | ORAL_TABLET | Freq: Two times a day (BID) | ORAL | 0 refills | Status: DC
Start: 2022-02-23 — End: 2022-02-26
  Filled 2022-02-23: qty 10, 5d supply, fill #0

## 2022-02-23 NOTE — Progress Notes (Signed)
PHARMACY NOTE:  ANTIMICROBIAL RENAL DOSAGE ADJUSTMENT  Current antimicrobial regimen includes a mismatch between antimicrobial dosage and estimated renal function.  As per policy approved by the Pharmacy & Therapeutics and Medical Executive Committees, the antimicrobial dosage will be adjusted accordingly.  Current antimicrobial dosage:  cefdinir 300mg  BID Indication: CAP  Renal Function:  Estimated Creatinine Clearance: 6.4 mL/min (A) (by C-G formula based on SCr of 8.68 mg/dL (H)). [x]      On intermittent HD, scheduled: []      On CRRT    Antimicrobial dosage has been changed to:  Cefdinir 300mg  q24h (after HD)  Additional comments:   Nona Gracey A. Levada Dy, PharmD, BCPS, FNKF Clinical Pharmacist [place] Please utilize Amion for appropriate phone number to reach the unit pharmacist ([place])  02/23/2022 9:35 AM

## 2022-02-23 NOTE — Progress Notes (Signed)
Discharge instructions provided to patient. All medications, follow up appointments, and discharge instructions provided to patient and family. IV out. Monitor off CCMD notified. Discharging home with family.  Era Bumpers, RN

## 2022-02-23 NOTE — TOC Initial Note (Signed)
Transition of Care [facility]) - Initial/Assessment Note    Patient Details  Name: Kenneth [name] MRN: [id number] Date of Birth: 04/18/1956  Transition of Care [facility]) CM/SW Contact:    Zenon Mayo, RN Phone Number: 02/23/2022, 12:30 PM  Clinical Narrative:                 Patient is from home with wife, he is indep, he states he has transportation home today. He has no needs.   Expected Discharge Plan: Home/Self Care Barriers to Discharge: No Barriers Identified   Patient Goals and CMS Choice Patient states their goals for this hospitalization and ongoing recovery are:: return home   Choice offered to / list presented to : NA  Expected Discharge Plan and Services Expected Discharge Plan: Home/Self Care   Discharge Planning Services: CM Consult Post Acute Care Choice: NA Living arrangements for the past 2 months: Single Family Home Expected Discharge Date: 02/23/22                 DME Agency: NA       HH Arranged: NA          Prior Living Arrangements/Services Living arrangements for the past 2 months: Single Family Home Lives with:: Spouse Patient language and need for interpreter reviewed:: Yes Do you feel safe going back to the place where you live?: Yes      Need for Family Participation in Patient Care: Yes (Comment) Care giver support system in place?: Yes (comment)   Criminal Activity/Legal Involvement Pertinent to Current Situation/Hospitalization: No - Comment as needed  Activities of Daily Living Home Assistive Devices/Equipment: None ADL Screening (condition at time of admission) Patient's cognitive ability adequate to safely complete daily activities?: Yes Is the patient deaf or have difficulty hearing?: No Does the patient have difficulty seeing, even when wearing glasses/contacts?: No Does the patient have difficulty concentrating, remembering, or making decisions?: No Patient able to express need for assistance with ADLs?: No Does the patient  have difficulty dressing or bathing?: No Independently performs ADLs?: Yes (appropriate for developmental age) Does the patient have difficulty walking or climbing stairs?: No Weakness of Legs: None Weakness of Arms/Hands: None  Permission Sought/Granted                  Emotional Assessment Appearance:: Appears stated age Attitude/Demeanor/Rapport: Engaged Affect (typically observed): Appropriate Orientation: : Oriented to Self, Oriented to Place, Oriented to  Time, Oriented to Situation Alcohol / Substance Use: Not Applicable Psych Involvement: No (comment)  Admission diagnosis:  Healthcare-associated pneumonia [J18.9] Elevated troponin [R77.8] Acute respiratory failure with hypoxia ([place]) [J96.01] HCAP (healthcare-associated pneumonia) [J18.9] Patient Active Problem List   Diagnosis Date Noted   COPD (chronic obstructive pulmonary disease) ([place]) 02/21/2022   Anemia of chronic disease 02/21/2022   Type 2 diabetes mellitus with hyperglycemia, with Kenneth-term current use of insulin ([place]) 10/28/2021   Type 2 diabetes mellitus with diabetic polyneuropathy, with Kenneth-term current use of insulin ([place]) 10/28/2021   Hypertension associated with diabetes ([place]) 09/16/2021   Chronic bilateral low back pain with bilateral sciatica 09/16/2021   Partial symptomatic epilepsy with complex partial seizures, not intractable, without status epilepticus ([place])    Acute respiratory failure with hypoxia ([place]) 07/28/2021   Prolonged QT interval 01/01/2021   Hemoptysis 01/01/2021   Occult blood in stools    Symptomatic bradycardia 12/15/2020   Postural dizziness with presyncope 12/15/2020   Blood in stool 06/06/3006   Acute metabolic encephalopathy 62/26/3335   Altered mental  status 08/27/2019   Unresponsive episode 08/27/2019   AF (paroxysmal atrial fibrillation) ([place]) 08/27/2019   Status post coronary artery stent placement    NSTEMI (non-ST elevated myocardial infarction) ([place]) 03/30/2018    Hypertensive heart disease 09/12/2017   Hyperlipidemia associated with type 2 diabetes mellitus ([place]) 09/12/2017   Hyperkalemia 07/20/2017   Chronic diastolic CHF (congestive heart failure) ([place]) 04/14/2017   Hypertensive emergency 04/14/2017   Anemia, chronic renal failure 04/11/2017   Fluid overload 04/11/2017   Diabetic foot ulcer ([place]) 04/11/2017   Cellulitis in diabetic foot ([place]) 01/18/2017   Chest pain 12/16/2016   Hypertensive urgency 12/16/2016   Pressure injury of skin 11/03/2016   Type 2 diabetes mellitus with chronic kidney disease on chronic dialysis, with Kenneth-term current use of insulin ([place]) 10/31/2016   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 ([place]) 10/30/2016   Atherosclerotic heart disease of native coronary artery with other forms of angina pectoris (HCC)    Gastroesophageal reflux disease 08/18/2016   HCAP (healthcare-associated pneumonia)    Non-ST elevation (NSTEMI) myocardial infarction [facility])    Pronation deformity of both feet 02/27/2015   Metatarsal deformity 02/27/2015   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[place]) 02/06/2015   Ischemic rest pain of lower extremity ([place]) 02/06/2015   PCP:  Shelda Pal, DO Pharmacy:   [facility] [address] [place] [address] Phone: [phone] Fax: [phone]     Social Determinants of Health (SDOH) Interventions    Readmission Risk Interventions    02/23/2022   12:28 PM 01/04/2021   12:48 PM  Readmission Risk Prevention Plan  Transportation Screening Complete Complete  Medication Review (RN Care Manager) Complete Complete  PCP or Specialist appointment within 3-5 days of discharge Complete   HRI or [place] Complete Complete  SW Recovery Care/Counseling Consult Complete Complete  Palliative Care Screening Not Applicable Not Applicable  Skilled Nursing Facility Not Applicable Not Applicable

## 2022-02-23 NOTE — Plan of Care (Signed)
  Problem: Nutrition: Goal: Adequate nutrition will be maintained Outcome: Completed/Met   Problem: Respiratory: Goal: Ability to maintain adequate ventilation will improve Outcome: Completed/Met

## 2022-02-23 NOTE — Procedures (Signed)
   I was present at this dialysis session, have reviewed the session itself and made  appropriate changes Kelly Splinter MD [place] pager [phone]   02/23/2022, 2:30 PM

## 2022-02-23 NOTE — Progress Notes (Signed)
D/C order noted. Contacted [place] SW to make clinic aware of pt's d/c today and that pt should resume care on Wednesday.   Melven Sartorius Renal Navigator [phone]

## 2022-02-23 NOTE — Discharge Summary (Signed)
PatientPhysician Discharge Summary  Kenneth [name] RJP:[id number] DOB: Mar 10, 1956 DOA: 02/21/2022  PCP: Shelda Pal, DO  Admit date: 02/21/2022 Discharge date: 02/23/2022 30 Day Unplanned Readmission Risk Score    Flowsheet Row ED to Hosp-Admission (Current) from 02/21/2022 in [place]  30 Day Unplanned Readmission Risk Score (%) 30.17 Filed at 02/23/2022 0801       This score is the patient's risk of an unplanned readmission within 30 days of being discharged (0 -100%). The score is based on dignosis, age, lab data, medications, orders, and past utilization.   Low:  0-14.9   Medium: 15-21.9   High: 22-29.9   Extreme: 30 and above          Admitted From: home Disposition:  home  Recommendations for Outpatient Follow-up:  Follow up with PCP in 1-2 weeks Please obtain BMP/CBC in one week Please follow up with your PCP on the following pending results: Unresulted Labs (From admission, onward)     Start     Ordered   02/22/22 1437  Hepatitis B surface antibody,quantitative  (New Admission Hemo Labs (Hepatitis B))  Once,   R       Question:  Specimen collection method  Answer:  Lab=Lab collect   02/22/22 1436   02/21/22 1258  Levetiracetam level  Once,   R        02/21/22 [place]: None Equipment/Devices: None  Discharge Condition: Stable CODE STATUS: Full code Diet recommendation: Renal  Subjective: Seen and examined in dialysis unit.  He feels better.  No more chest pain, shortness of breath or neck pain.  He is happy to go home today.  Brief/Interim Summary: Kenneth [name] is a [place] speaking 66 y.o. male with medical history significant for ESRD on MWF HD, CAD s/p CABG and DES, HFpEF (EF 60-65% TTE 07/24/2021), PAF on Eliquis, COPD, T2DM using insulin pump, HTN, HLD, anemia of chronic disease, seizure disorder, depression, uveitis who presented to the ED for evaluation of shortness of breath for the  last 7-10 days of progressive exertional dyspnea with associated intermittent right-sided chest discomfort.  He has chronic cough at baseline attributed to chronic bronchitis from prior smoking history.  Symptoms improved with rest.  He has been feeling generally weak.  He has had some subjective fevers and diaphoresis.  He does not make urine.   Upon arrival to ED, he was hemodynamically stable except that he was mildly hypoxic requiring 2 L of oxygen.  He was COVID-negative.  Portable chest x-ray was negative for focal consolidation, CTA chest was negative for PE but did show some right trace pleural effusion.  CT abdomen and pelvis with contrast was negative for any acute findings.  It did show dependent groundglass opacities in both lungs. Patient was given 500 cc normal saline, IV vancomycin and cefepime.  Patient was admitted to hospital service.   Acute respiratory failure with hypoxia (HCC) secondary to healthcare associated pneumonia, POA: Lowest documented SPO2 87% required 2 L of oxygen but currently saturating well on room air. Patient has underlying COPD with chronic bronchitis and has not required supplemental O2 at home prior to admission.  Chest x-ray with possibility of pneumonia.  He is feeling much better.  He is weaned down to room air and saturating more than 95%.  Discharging him on 3 days of Augmentin as well as 5 days of doxycycline per  pharmacy recommendations.   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 ([place]) Completed full HD session 6/9.  Getting another dialysis today.  Will resume hemodialysis as outpatient.   COPD (chronic obstructive pulmonary disease) ([place]): Stable. No wheezing on admission.  Continue Stiolto with albuterol as needed.   Atherosclerotic heart disease of native coronary artery with other forms of angina pectoris (HCC)/chest pain/demand ischemia: Has intermittent exertional chest discomfort, denies chest pain on arrival to floor.  Has chronically elevated troponin which is  downtrending and lower than previous levels indicating demand ischemia.  Follows with cardiology, Dr. Aundra Dubin.  Not on antiplatelet as he is on Eliquis. -Continue Eliquis 5 mg twice daily -Continue Coreg 6.25 mg twice daily -Continue Imdur 90 mg nondialysis days -Continue atorvastatin and Zetia   AF (paroxysmal atrial fibrillation) (HCC) Sinus rhythm with controlled rate on admission.  Continue Coreg and Eliquis.   Chronic diastolic CHF (congestive heart failure) (HCC) Euvolemic on admission.  Volume control with HD.   Type 2 diabetes mellitus with chronic kidney disease on chronic dialysis, with long-term current use of insulin (HCC) Continue insulin pump.   Anemia of chronic disease: His baseline hemoglobin is around 8.5, presented with 8.8 yesterday and down to 7.5 today.     Hypertension associated with diabetes [facility]): Very well controlled. Continue Coreg, hydralazine, Imdur.   Hyperlipidemia associated with type 2 diabetes mellitus (HCC) Continue atorvastatin and Zetia.  Discharge plan was discussed with patient and/or family member and they verbalized understanding and agreed with it.  Discharge Diagnoses:  Principal Problem:   Acute respiratory failure with hypoxia (HCC) Active Problems:   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 ([place])   Atherosclerotic heart disease of native coronary artery with other forms of angina pectoris (HCC)   COPD (chronic obstructive pulmonary disease) (HCC)   AF (paroxysmal atrial fibrillation) (HCC)   Chronic diastolic CHF (congestive heart failure) (HCC)   Type 2 diabetes mellitus with chronic kidney disease on chronic dialysis, with long-term current use of insulin ([place])   HCAP (healthcare-associated pneumonia)   Hyperlipidemia associated with type 2 diabetes mellitus ([place])   Hypertension associated with diabetes ([place])   Anemia of chronic disease    Discharge Instructions   Allergies as of 02/23/2022   No Known Allergies      Medication List      TAKE these medications    amoxicillin-clavulanate 500-125 MG tablet Commonly known as: Augmentin Take 1 tablet (500 mg total) by mouth daily for 3 days.   atorvastatin 80 MG tablet Commonly known as: LIPITOR Take 1 tablet (80 mg total) by mouth daily.   calcium acetate 667 MG capsule Commonly known as: PHOSLO Take 667 mg by mouth 3 (three) times daily with meals.   carvedilol 6.25 MG tablet Commonly known as: COREG Take 1 tablet (6.25 mg total) by mouth 2 (two) times daily with a meal. Hold on the mornings of dialysis.   diclofenac Sodium 1 % Gel Commonly known as: Voltaren Apply 4 grams topically 4 (four) times daily.   doxycycline 100 MG tablet Commonly known as: ADOXA Take 1 tablet (100 mg total) by mouth 2 (two) times daily for 5 days.   Eliquis 5 MG Tabs tablet Generic drug: apixaban Take 1 tablet by mouth 2 times daily   ezetimibe 10 MG tablet Commonly known as: ZETIA Take 1 tablet by mouth daily   FreeStyle Libre 2 Sensor Misc 1 Device by Does not apply route every 14 (fourteen) days.   hydrALAZINE 100 MG tablet Commonly known as: APRESOLINE  Take 1 tablet (100 mg total) by mouth 3 (three) times daily. DO NOT TAKE AM AND NOON DOSES ON HD DAYS   insulin pump Soln Inject 1 each into the skin continuous. NOVOLOG - 20 units   isosorbide mononitrate 60 MG 24 hr tablet Commonly known as: IMDUR Take 1&1/2 tablets (90 mg total) by mouth daily. Hold on days of dialysis on Mon, Wed, Fri.   LOKELMA PO Take 1 packet by mouth 4 (four) times a week. Hold on dialysis days   Lyumjev 100 UNIT/ML Soln Generic drug: Insulin Lispro-aabc Max Daily 30 units per insulin pump   MIRCERA IJ Mircera   OneTouch Verio test strip Generic drug: glucose blood 1 each by Other route in the morning, at noon, in the evening, and at bedtime. Use as instructed   pantoprazole 40 MG tablet Commonly known as: PROTONIX Take 1 tablet (40 mg total) by mouth 2 (two) times daily.    sertraline 50 MG tablet Commonly known as: Zoloft Take 1 tablet (50 mg total) by mouth daily.   Stiolto Respimat 2.5-2.5 MCG/ACT Aers Generic drug: Tiotropium Bromide-Olodaterol Inhale 2 puffs by mouth into the lungs daily.   TechLite Pen Needles 32G X 6 MM Misc Generic drug: Insulin Pen Needle Use as directed 3 times daily        Follow-up Information     Shelda Pal, DO Follow up in 1 week(s).   Specialty: Family Medicine Contact information: [address] [place] [address] [phone]         Larey Dresser, MD .   Specialty: Cardiology Contact information: [phone] [place] [address] [phone]                No Known Allergies  Consultations: Nephrology   Procedures/Studies: CT ABDOMEN PELVIS W CONTRAST  Result Date: 02/21/2022 CLINICAL DATA:  Abdominal pain. EXAM: CT ABDOMEN AND PELVIS WITH CONTRAST TECHNIQUE: Multidetector CT imaging of the abdomen and pelvis was performed using the standard protocol following bolus administration of intravenous contrast. RADIATION DOSE REDUCTION: This exam was performed according to the departmental dose-optimization program which includes automated exposure control, adjustment of the mA and/or kV according to patient size and/or use of iterative reconstruction technique. CONTRAST:  149mL OMNIPAQUE IOHEXOL 350 MG/ML SOLN COMPARISON:  CT [id number] FINDINGS: Lower chest: Dependent ground-glass opacity in both lungs is likely related to atelectasis although multifocal pneumonia not excluded. Small right and tiny left pleural effusions evident. Hepatobiliary: No suspicious focal abnormality within the liver parenchyma. There is no evidence for gallstones, gallbladder wall thickening, or pericholecystic fluid. No intrahepatic or extrahepatic biliary dilation. Pancreas: No focal mass lesion. No dilatation of the main duct. No intraparenchymal cyst. No peripancreatic  edema. Spleen: No splenomegaly. No focal mass lesion. Adrenals/Urinary Tract: No adrenal nodule or mass. Stable small cyst noted upper pole left kidney. No followup recommended. Right kidney unremarkable. No evidence for hydroureter. The urinary bladder appears normal for the degree of distention. Stomach/Bowel: Stomach is nondistended. Duodenum is normally positioned as is the ligament of Treitz. No small bowel wall thickening. No small bowel dilatation. The terminal ileum is normal. The appendix is normal. No gross colonic mass. No colonic wall thickening. Vascular/Lymphatic: There is moderate atherosclerotic calcification of the abdominal aorta without aneurysm. There is no gastrohepatic or hepatoduodenal ligament lymphadenopathy. No retroperitoneal or mesenteric lymphadenopathy. No pelvic sidewall lymphadenopathy. Reproductive: The prostate gland and seminal vesicles are unremarkable. Other: No intraperitoneal free fluid. Musculoskeletal:  No worrisome lytic or sclerotic osseous abnormality. IMPRESSION: 1. No acute findings in the abdomen or pelvis. 2. Dependent ground-glass opacity in both lungs is likely related to atelectasis although multifocal pneumonia not excluded. Small right and tiny left pleural effusions evident. 3. Aortic Atherosclerosis (ICD10-I70.0). Electronically Signed   By: Misty Stanley M.D.   On: 02/21/2022 15:03   CT Angio Chest PE W and/or Wo Contrast  Result Date: 02/21/2022 CLINICAL DATA:  PE suspected, right-sided chest pain for 10 days EXAM: CT ANGIOGRAPHY CHEST WITH CONTRAST TECHNIQUE: Multidetector CT imaging of the chest was performed using the standard protocol during bolus administration of intravenous contrast. Multiplanar CT image reconstructions and MIPs were obtained to evaluate the vascular anatomy. RADIATION DOSE REDUCTION: This exam was performed according to the departmental dose-optimization program which includes automated exposure control, adjustment of the mA and/or  kV according to patient size and/or use of iterative reconstruction technique. CONTRAST:  148mL OMNIPAQUE IOHEXOL 350 MG/ML SOLN COMPARISON:  10/21/2021 FINDINGS: Cardiovascular: Satisfactory opacification of the pulmonary arteries to the segmental level. No evidence of pulmonary embolism. Cardiomegaly. Three-vessel coronary artery calcifications status post median sternotomy and CABG. No pericardial effusion. Mediastinum/Nodes: No enlarged mediastinal, hilar, or axillary lymph nodes. Thyroid gland, trachea, and esophagus demonstrate no significant findings. Lungs/Pleura: Small right, trace left pleural effusions. Moderate centrilobular and paraseptal emphysema. Upper Abdomen: No acute abnormality. Please see separately reported examination of the abdomen and pelvis. Musculoskeletal: No chest wall abnormality. No acute osseous findings. Review of the MIP images confirms the above findings. IMPRESSION: 1. Negative examination for pulmonary embolism. 2. Small right, trace left pleural effusions. 3. Moderate emphysema. 4. Coronary artery disease. Emphysema (ICD10-J43.9). Electronically Signed   By: Delanna Ahmadi M.D.   On: 02/21/2022 14:57   CT Head Wo Contrast  Result Date: 02/21/2022 CLINICAL DATA:  Syncope. EXAM: CT HEAD WITHOUT CONTRAST TECHNIQUE: Contiguous axial images were obtained from the base of the skull through the vertex without intravenous contrast. RADIATION DOSE REDUCTION: This exam was performed according to the departmental dose-optimization program which includes automated exposure control, adjustment of the mA and/or kV according to patient size and/or use of iterative reconstruction technique. COMPARISON:  12/22/2020 FINDINGS: Brain: No acute intracranial hemorrhage. No focal mass lesion. No CT evidence of acute infarction. No midline shift or mass effect. No hydrocephalus. Basilar cisterns are patent. There are periventricular and subcortical white matter hypodensities. Generalized cortical  atrophy. Vascular: No hyperdense vessel or unexpected calcification. Skull: Normal. Negative for fracture or focal lesion. Sinuses/Orbits: Paranasal sinuses and mastoid air cells are clear. Orbits are clear. Other: None. IMPRESSION: 1. No change from comparison exam. 2. Atrophy and subcortical white matter disease. Electronically Signed   By: Suzy Bouchard M.D.   On: 02/21/2022 14:48   DG Chest Port 1 View  Result Date: 02/21/2022 CLINICAL DATA:  Short of breath EXAM: PORTABLE CHEST 1 VIEW COMPARISON:  None Available. FINDINGS: Sternotomy wires overlie normal cardiac silhouette. No effusion, infiltrate or pneumothorax. Normal pulmonary vasculature. Atherosclerotic calcification of the aorta. IMPRESSION: No acute cardiopulmonary process. Electronically Signed   By: Suzy Bouchard M.D.   On: 02/21/2022 13:21     Discharge Exam: Vitals:   02/23/22 0933 02/23/22 1000  BP: (!) 160/53 (!) 149/51  Pulse: 64 66  Resp: 19 20  Temp:    SpO2: 97% 94%   Vitals:   02/23/22 0800 02/23/22 0909 02/23/22 0933 02/23/22 1000  BP: (!) 165/61 (!) 165/57 (!) 160/53 (!) 149/51  Pulse: 62 64 64 66  Resp: 16 13 19 20   Temp:      TempSrc:      SpO2: 98% 96% 97% 94%  Weight:      Height:        General: Pt is alert, awake, not in acute distress Cardiovascular: RRR, S1/S2 +, no rubs, no gallops Respiratory: CTA bilaterally, no wheezing, no rhonchi Abdominal: Soft, NT, ND, bowel sounds + Extremities: no edema, no cyanosis    The results of significant diagnostics from this hospitalization (including imaging, microbiology, ancillary and laboratory) are listed below for reference.     Microbiology: Recent Results (from the past 240 hour(s))  Blood culture (routine x 2)     Status: None (Preliminary result)   Collection Time: 02/21/22 12:50 PM   Specimen: BLOOD RIGHT ARM  Result Value Ref Range Status   Specimen Description   Final    BLOOD RIGHT ARM BLOOD Performed at [facility], [address] [place], [place] [address]    Special Requests   Final    Blood Culture adequate volume BOTTLES DRAWN AEROBIC AND ANAEROBIC Performed at [facility], [address] [place], [place] [address]    Culture   Final    NO GROWTH 2 DAYS Performed at [facility], [place] [address] [place], [place] [address]    Report Status PENDING  Incomplete  SARS Coronavirus 2 by RT PCR (hospital order, performed in [facility] hospital lab) *cepheid single result test* Anterior Nasal Swab     Status: None   Collection Time: 02/21/22  1:00 PM   Specimen: Anterior Nasal Swab  Result Value Ref Range Status   SARS Coronavirus 2 by RT PCR NEGATIVE NEGATIVE Final    Comment: (NOTE) SARS-CoV-2 target nucleic acids are NOT DETECTED.  The SARS-CoV-2 RNA is generally detectable in upper and lower respiratory specimens during the acute phase of infection. The lowest concentration of SARS-CoV-2 viral copies this assay can detect is 250 copies / mL. A negative result does not preclude SARS-CoV-2 infection and should not be used as the sole basis for treatment or other patient management decisions.  A negative result may occur with improper specimen collection / handling, submission of specimen other than nasopharyngeal swab, presence of viral mutation(s) within the areas targeted by this assay, and inadequate number of viral copies (<250 copies / mL). A negative result must be combined with clinical observations, patient history, and epidemiological information.  Fact Sheet for Patients:   https://www.patel.info/  Fact Sheet for Healthcare Providers: https://hall.com/  This test is not yet approved or  cleared by the [place] FDA and has been authorized for detection and/or diagnosis of SARS-CoV-2 by FDA under an Emergency Use Authorization (EUA).  This EUA will remain in effect (meaning this test can be used) for the duration of  the COVID-19 declaration under Section 564(b)(1) of the Act, 21 U.S.C. section 360bbb-3(b)(1), unless the authorization is terminated or revoked sooner.  Performed at [facility], [address] [place], [place] [address]   Culture, blood (Routine X 2) w Reflex to ID Panel     Status: None (Preliminary result)   Collection Time: 02/21/22  7:52 PM   Specimen: BLOOD RIGHT HAND  Result Value Ref Range Status   Specimen Description BLOOD RIGHT HAND  Final   Special Requests   Final    BOTTLES DRAWN AEROBIC AND ANAEROBIC Blood Culture adequate volume   Culture   Final  NO GROWTH 2 DAYS Performed at [facility], [place] [address] [place], [place] [address]    Report Status PENDING  Incomplete  MRSA Next Gen by PCR, Nasal     Status: Abnormal   Collection Time: 02/22/22 12:32 PM   Specimen: Anterior Nasal Swab  Result Value Ref Range Status   MRSA by PCR Next Gen DETECTED (A) NOT DETECTED Final    Comment: RESULT CALLED TO, READ BACK BY AND VERIFIED WITH: M FUTRELL,RN@[email or web address]  02/22/22 [place] (NOTE) The GeneXpert MRSA Assay (FDA approved for NASAL specimens only), is one component of a comprehensive MRSA colonization surveillance program. It is not intended to diagnose MRSA infection nor to guide or monitor treatment for MRSA infections. Test performance is not FDA approved in patients less than 79 years old. Performed at [facility], [place] [address], [place], [place] [address]   SARS Coronavirus 2 by RT PCR (hospital order, performed in [facility] hospital lab) *cepheid single result test* Anterior Nasal Swab     Status: None   Collection Time: 02/22/22 12:32 PM   Specimen: Anterior Nasal Swab  Result Value Ref Range Status   SARS Coronavirus 2 by RT PCR NEGATIVE NEGATIVE Final    Comment: (NOTE) SARS-CoV-2 target nucleic acids are NOT DETECTED.  The SARS-CoV-2 RNA is generally detectable in upper and lower respiratory specimens during the acute phase of  infection. The lowest concentration of SARS-CoV-2 viral copies this assay can detect is 250 copies / mL. A negative result does not preclude SARS-CoV-2 infection and should not be used as the sole basis for treatment or other patient management decisions.  A negative result may occur with improper specimen collection / handling, submission of specimen other than nasopharyngeal swab, presence of viral mutation(s) within the areas targeted by this assay, and inadequate number of viral copies (<250 copies / mL). A negative result must be combined with clinical observations, patient history, and epidemiological information.  Fact Sheet for Patients:   https://www.patel.info/  Fact Sheet for Healthcare Providers: https://hall.com/  This test is not yet approved or  cleared by the [place] FDA and has been authorized for detection and/or diagnosis of SARS-CoV-2 by FDA under an Emergency Use Authorization (EUA).  This EUA will remain in effect (meaning this test can be used) for the duration of the COVID-19 declaration under Section 564(b)(1) of the Act, 21 U.S.C. section 360bbb-3(b)(1), unless the authorization is terminated or revoked sooner.  Performed at [facility], [place] [address] [place], [place] [address]      Labs: BNP (last 3 results) Recent Labs    07/28/21 0724 07/28/21 1944  BNP 1,925.2* 7,672.0*   Basic Metabolic Panel: Recent Labs  Lab 02/21/22 1250 02/21/22 1300 02/22/22 0344 02/23/22 0231  NA 132* 131* 131* 125*  K 3.2* 3.3* 3.4* 3.4*  CL 91*  --  91* 89*  CO2 31  --  28 25  GLUCOSE 252*  --  196* 159*  BUN 23  --  26* 35*  CREATININE 5.73*  --  6.89* 8.68*  CALCIUM 8.3*  --  7.9* 8.1*  PHOS  --   --   --  2.5   Liver Function Tests: Recent Labs  Lab 02/21/22 1250 02/23/22 0231  AST 23  --   ALT 13  --   ALKPHOS 84  --   BILITOT 1.1  --   PROT 7.3  --   ALBUMIN 3.1* 2.3*   Recent Labs  Lab 02/21/22 1250  LIPASE 32   Recent Labs  Lab 02/21/22 1404  AMMONIA 14   CBC: Recent Labs  Lab 02/21/22 1250 02/21/22 1300 02/22/22 0344 02/23/22 0231  WBC 6.7  --  5.5 6.1  HGB 8.8* 8.8* 7.2* 7.6*  HCT 25.9* 26.0* 21.0* 22.3*  MCV 92.8  --  93.3 92.1  PLT 187  --  157 188   Cardiac Enzymes: Recent Labs  Lab 02/21/22 1250  CKTOTAL 81   BNP: Invalid input(s): "POCBNP" CBG: Recent Labs  Lab 02/22/22 0309 02/22/22 2114 02/22/22 2144 02/23/22 0238 02/23/22 0632  GLUCAP 195* 198* 193* 172* 181*   D-Dimer Recent Labs    02/21/22 1250  DDIMER 0.78*   Hgb A1c No results for input(s): "HGBA1C" in the last 72 hours. Lipid Profile No results for input(s): "CHOL", "HDL", "LDLCALC", "TRIG", "CHOLHDL", "LDLDIRECT" in the last 72 hours. Thyroid function studies Recent Labs    02/21/22 1332  TSH 2.813   Anemia work up No results for input(s): "VITAMINB12", "FOLATE", "FERRITIN", "TIBC", "IRON", "RETICCTPCT" in the last 72 hours. Urinalysis    Component Value Date/Time   COLORURINE YELLOW 10/30/2016 [place] 10/30/2016 2339   LABSPEC 1.010 10/30/2016 2339   PHURINE 5.0 10/30/2016 2339   GLUCOSEU NEGATIVE 10/30/2016 2339   HGBUR NEGATIVE 10/30/2016 2339   BILIRUBINUR NEGATIVE 10/30/2016 2339   KETONESUR NEGATIVE 10/30/2016 2339   PROTEINUR 100 (A) 10/30/2016 2339   NITRITE NEGATIVE 10/30/2016 2339   LEUKOCYTESUR NEGATIVE 10/30/2016 2339   Sepsis Labs Recent Labs  Lab 02/21/22 1250 02/22/22 0344 02/23/22 0231  WBC 6.7 5.5 6.1   Microbiology Recent Results (from the past 240 hour(s))  Blood culture (routine x 2)     Status: None (Preliminary result)   Collection Time: 02/21/22 12:50 PM   Specimen: BLOOD RIGHT ARM  Result Value Ref Range Status   Specimen Description   Final    BLOOD RIGHT ARM BLOOD Performed at [facility], [address] [place], [place] [address]    Special Requests   Final    Blood Culture  adequate volume BOTTLES DRAWN AEROBIC AND ANAEROBIC Performed at [facility], [address] [place], [place] [address]    Culture   Final    NO GROWTH 2 DAYS Performed at [facility], [place] [address] [place], [place] [address]    Report Status PENDING  Incomplete  SARS Coronavirus 2 by RT PCR (hospital order, performed in [facility] hospital lab) *cepheid single result test* Anterior Nasal Swab     Status: None   Collection Time: 02/21/22  1:00 PM   Specimen: Anterior Nasal Swab  Result Value Ref Range Status   SARS Coronavirus 2 by RT PCR NEGATIVE NEGATIVE Final    Comment: (NOTE) SARS-CoV-2 target nucleic acids are NOT DETECTED.  The SARS-CoV-2 RNA is generally detectable in upper and lower respiratory specimens during the acute phase of infection. The lowest concentration of SARS-CoV-2 viral copies this assay can detect is 250 copies / mL. A negative result does not preclude SARS-CoV-2 infection and should not be used as the sole basis for treatment or other patient management decisions.  A negative result may occur with improper specimen collection / handling, submission of specimen other than nasopharyngeal swab, presence of viral mutation(s) within the areas targeted by this assay, and inadequate number of viral copies (<250 copies / mL). A negative result must be combined with clinical observations, patient  history, and epidemiological information.  Fact Sheet for Patients:   https://www.patel.info/  Fact Sheet for Healthcare Providers: https://hall.com/  This test is not yet approved or  cleared by the [place] FDA and has been authorized for detection and/or diagnosis of SARS-CoV-2 by FDA under an Emergency Use Authorization (EUA).  This EUA will remain in effect (meaning this test can be used) for the duration of the COVID-19 declaration under Section 564(b)(1) of the Act, 21 U.S.C. section  360bbb-3(b)(1), unless the authorization is terminated or revoked sooner.  Performed at [facility], [address] [place], [place] [address]   Culture, blood (Routine X 2) w Reflex to ID Panel     Status: None (Preliminary result)   Collection Time: 02/21/22  7:52 PM   Specimen: BLOOD RIGHT HAND  Result Value Ref Range Status   Specimen Description BLOOD RIGHT HAND  Final   Special Requests   Final    BOTTLES DRAWN AEROBIC AND ANAEROBIC Blood Culture adequate volume   Culture   Final    NO GROWTH 2 DAYS Performed at [facility], [place] [address] [place], [place] [address]    Report Status PENDING  Incomplete  MRSA Next Gen by PCR, Nasal     Status: Abnormal   Collection Time: 02/22/22 12:32 PM   Specimen: Anterior Nasal Swab  Result Value Ref Range Status   MRSA by PCR Next Gen DETECTED (A) NOT DETECTED Final    Comment: RESULT CALLED TO, READ BACK BY AND VERIFIED WITH: M FUTRELL,RN@2047  02/22/22 [place] (NOTE) The GeneXpert MRSA Assay (FDA approved for NASAL specimens only), is one component of a comprehensive MRSA colonization surveillance program. It is not intended to diagnose MRSA infection nor to guide or monitor treatment for MRSA infections. Test performance is not FDA approved in patients less than 14 years old. Performed at [facility], [place] [address] [place],  [address]   SARS Coronavirus 2 by RT PCR (hospital order, performed in [facility] hospital lab) *cepheid single result test* Anterior Nasal Swab     Status: None   Collection Time: 02/22/22 12:32 PM   Specimen: Anterior Nasal Swab  Result Value Ref Range Status   SARS Coronavirus 2 by RT PCR NEGATIVE NEGATIVE Final    Comment: (NOTE) SARS-CoV-2 target nucleic acids are NOT DETECTED.  The SARS-CoV-2 RNA is generally detectable in upper and lower respiratory specimens during the acute phase of infection. The lowest concentration of SARS-CoV-2 viral copies this assay can detect  is 250 copies / mL. A negative result does not preclude SARS-CoV-2 infection and should not be used as the sole basis for treatment or other patient management decisions.  A negative result may occur with improper specimen collection / handling, submission of specimen other than nasopharyngeal swab, presence of viral mutation(s) within the areas targeted by this assay, and inadequate number of viral copies (<250 copies / mL). A negative result must be combined with clinical observations, patient history, and epidemiological information.  Fact Sheet for Patients:   https://www.patel.info/  Fact Sheet for Healthcare Providers: https://hall.com/  This test is not yet approved or  cleared by the [place] FDA and has been authorized for detection and/or diagnosis of SARS-CoV-2 by FDA under an Emergency Use Authorization (EUA).  This EUA will remain in effect (meaning this test can be used) for the duration of the COVID-19 declaration under Section 564(b)(1) of the Act, 21 U.S.C. section 360bbb-3(b)(1), unless the authorization is  terminated or revoked sooner.  Performed at [facility], [place] [address] [place], [place] [address]      Time coordinating discharge: Over 30 minutes  SIGNED:   Darliss Cheney, MD  [facility] 02/23/2022, 10:24 AM *Please note that this is a verbal dictation therefore any spelling or grammatical errors are due to the "[employer]" system interpretation. If 7PM-7AM, please contact night-coverage [email or web address]

## 2022-02-23 NOTE — TOC Transition Note (Signed)
Transition of Care [facility]) - CM/SW Discharge Note   Patient Details  Name: Kenneth [name] MRN: [id number] Date of Birth: August 17, 1956  Transition of Care [facility]) CM/SW Contact:  Zenon Mayo, RN Phone Number: 02/23/2022, 12:31 PM   Clinical Narrative:    For dc home today, has no needs.    Final next level of care: Home/Self Care Barriers to Discharge: No Barriers Identified   Patient Goals and CMS Choice Patient states their goals for this hospitalization and ongoing recovery are:: return home   Choice offered to / list presented to : NA  Discharge Placement                       Discharge Plan and Services   Discharge Planning Services: CM Consult Post Acute Care Choice: NA            DME Agency: NA       HH Arranged: NA          Social Determinants of Health (SDOH) Interventions     Readmission Risk Interventions    02/23/2022   12:28 PM 01/04/2021   12:48 PM  Readmission Risk Prevention Plan  Transportation Screening Complete Complete  Medication Review Press photographer) Complete Complete  PCP or Specialist appointment within 3-5 days of discharge Complete   HRI or [place] Complete Complete  SW Recovery Care/Counseling Consult Complete Complete  Palliative Care Screening Not Applicable Not [place] Not Applicable Not Applicable

## 2022-02-24 ENCOUNTER — Other Ambulatory Visit: Payer: Self-pay

## 2022-02-24 ENCOUNTER — Emergency Department (HOSPITAL_COMMUNITY): Payer: Medicare Other

## 2022-02-24 ENCOUNTER — Inpatient Hospital Stay (HOSPITAL_COMMUNITY)
Admission: EM | Admit: 2022-02-24 | Discharge: 2022-02-26 | DRG: 291 | Disposition: A | Discharge status: Home / Self Care | Payer: Medicare Other | Attending: Internal Medicine | Admitting: Internal Medicine

## 2022-02-24 ENCOUNTER — Encounter (HOSPITAL_COMMUNITY): Payer: Self-pay

## 2022-02-24 DIAGNOSIS — G40109 Localization-related (focal) (partial) symptomatic epilepsy and epileptic syndromes with simple partial seizures, not intractable, without status epilepticus: Secondary | ICD-10-CM

## 2022-02-24 DIAGNOSIS — Z951 Presence of aortocoronary bypass graft: Secondary | ICD-10-CM

## 2022-02-24 DIAGNOSIS — E1051 Type 1 diabetes mellitus with diabetic peripheral angiopathy without gangrene: Secondary | ICD-10-CM | POA: Diagnosis present

## 2022-02-24 DIAGNOSIS — D638 Anemia in other chronic diseases classified elsewhere: Secondary | ICD-10-CM | POA: Diagnosis present

## 2022-02-24 DIAGNOSIS — I4510 Unspecified right bundle-branch block: Secondary | ICD-10-CM | POA: Diagnosis present

## 2022-02-24 DIAGNOSIS — I2510 Atherosclerotic heart disease of native coronary artery without angina pectoris: Secondary | ICD-10-CM | POA: Diagnosis present

## 2022-02-24 DIAGNOSIS — Z800 Family history of malignant neoplasm of digestive organs: Secondary | ICD-10-CM | POA: Diagnosis not present

## 2022-02-24 DIAGNOSIS — I5032 Chronic diastolic (congestive) heart failure: Secondary | ICD-10-CM | POA: Diagnosis present

## 2022-02-24 DIAGNOSIS — I252 Old myocardial infarction: Secondary | ICD-10-CM

## 2022-02-24 DIAGNOSIS — N2581 Secondary hyperparathyroidism of renal origin: Secondary | ICD-10-CM | POA: Diagnosis present

## 2022-02-24 DIAGNOSIS — R4182 Altered mental status, unspecified: Secondary | ICD-10-CM | POA: Diagnosis present

## 2022-02-24 DIAGNOSIS — M898X9 Other specified disorders of bone, unspecified site: Secondary | ICD-10-CM | POA: Diagnosis present

## 2022-02-24 DIAGNOSIS — E1022 Type 1 diabetes mellitus with diabetic chronic kidney disease: Secondary | ICD-10-CM | POA: Diagnosis present

## 2022-02-24 DIAGNOSIS — G9341 Metabolic encephalopathy: Secondary | ICD-10-CM | POA: Diagnosis present

## 2022-02-24 DIAGNOSIS — N186 End stage renal disease: Secondary | ICD-10-CM | POA: Diagnosis present

## 2022-02-24 DIAGNOSIS — I480 Paroxysmal atrial fibrillation: Secondary | ICD-10-CM | POA: Diagnosis present

## 2022-02-24 DIAGNOSIS — Z7901 Long term (current) use of anticoagulants: Secondary | ICD-10-CM

## 2022-02-24 DIAGNOSIS — J9601 Acute respiratory failure with hypoxia: Secondary | ICD-10-CM | POA: Diagnosis present

## 2022-02-24 DIAGNOSIS — Z794 Long term (current) use of insulin: Secondary | ICD-10-CM

## 2022-02-24 DIAGNOSIS — K219 Gastro-esophageal reflux disease without esophagitis: Secondary | ICD-10-CM | POA: Diagnosis present

## 2022-02-24 DIAGNOSIS — I132 Hypertensive heart and chronic kidney disease with heart failure and with stage 5 chronic kidney disease, or end stage renal disease: Principal | ICD-10-CM | POA: Diagnosis present

## 2022-02-24 DIAGNOSIS — D649 Anemia, unspecified: Secondary | ICD-10-CM | POA: Diagnosis present

## 2022-02-24 DIAGNOSIS — Z992 Dependence on renal dialysis: Secondary | ICD-10-CM

## 2022-02-24 DIAGNOSIS — Z955 Presence of coronary angioplasty implant and graft: Secondary | ICD-10-CM

## 2022-02-24 DIAGNOSIS — Z79899 Other long term (current) drug therapy: Secondary | ICD-10-CM

## 2022-02-24 DIAGNOSIS — D631 Anemia in chronic kidney disease: Secondary | ICD-10-CM | POA: Diagnosis present

## 2022-02-24 DIAGNOSIS — R0902 Hypoxemia: Principal | ICD-10-CM

## 2022-02-24 DIAGNOSIS — E785 Hyperlipidemia, unspecified: Secondary | ICD-10-CM | POA: Diagnosis present

## 2022-02-24 DIAGNOSIS — R0602 Shortness of breath: Secondary | ICD-10-CM

## 2022-02-24 DIAGNOSIS — Z833 Family history of diabetes mellitus: Secondary | ICD-10-CM | POA: Diagnosis not present

## 2022-02-24 DIAGNOSIS — E8770 Fluid overload, unspecified: Secondary | ICD-10-CM | POA: Diagnosis present

## 2022-02-24 DIAGNOSIS — J449 Chronic obstructive pulmonary disease, unspecified: Secondary | ICD-10-CM | POA: Diagnosis present

## 2022-02-24 DIAGNOSIS — R079 Chest pain, unspecified: Secondary | ICD-10-CM | POA: Diagnosis present

## 2022-02-24 DIAGNOSIS — Z8249 Family history of ischemic heart disease and other diseases of the circulatory system: Secondary | ICD-10-CM

## 2022-02-24 DIAGNOSIS — Z9641 Presence of insulin pump (external) (internal): Secondary | ICD-10-CM | POA: Diagnosis present

## 2022-02-24 DIAGNOSIS — E875 Hyperkalemia: Secondary | ICD-10-CM

## 2022-02-24 LAB — CBC WITH DIFFERENTIAL/PLATELET
Abs Immature Granulocytes: 0.03 10*3/uL (ref 0.00–0.07)
Basophils Absolute: 0 10*3/uL (ref 0.0–0.1)
Basophils Relative: 1 %
Eosinophils Absolute: 0.2 10*3/uL (ref 0.0–0.5)
Eosinophils Relative: 4 %
HCT: 23.7 % — ABNORMAL LOW (ref 39.0–52.0)
Hemoglobin: 7.8 g/dL — ABNORMAL LOW (ref 13.0–17.0)
Immature Granulocytes: 1 %
Lymphocytes Relative: 12 %
Lymphs Abs: 0.8 10*3/uL (ref 0.7–4.0)
MCH: 31 pg (ref 26.0–34.0)
MCHC: 32.9 g/dL (ref 30.0–36.0)
MCV: 94 fL (ref 80.0–100.0)
Monocytes Absolute: 0.8 10*3/uL (ref 0.1–1.0)
Monocytes Relative: 13 %
Neutro Abs: 4.5 10*3/uL (ref 1.7–7.7)
Neutrophils Relative %: 69 %
Platelets: 210 10*3/uL (ref 150–400)
RBC: 2.52 MIL/uL — ABNORMAL LOW (ref 4.22–5.81)
RDW: 14.3 % (ref 11.5–15.5)
WBC: 6.3 10*3/uL (ref 4.0–10.5)
nRBC: 0 % (ref 0.0–0.2)

## 2022-02-24 LAB — BASIC METABOLIC PANEL
Anion gap: 13 (ref 5–15)
BUN: 26 mg/dL — ABNORMAL HIGH (ref 8–23)
CO2: 26 mmol/L (ref 22–32)
Calcium: 8.8 mg/dL — ABNORMAL LOW (ref 8.9–10.3)
Chloride: 94 mmol/L — ABNORMAL LOW (ref 98–111)
Creatinine, Ser: 7.23 mg/dL — ABNORMAL HIGH (ref 0.61–1.24)
GFR, Estimated: 8 mL/min — ABNORMAL LOW (ref >60)
Glucose, Bld: 217 mg/dL — ABNORMAL HIGH (ref 70–99)
Potassium: 3.9 mmol/L (ref 3.5–5.1)
Sodium: 133 mmol/L — ABNORMAL LOW (ref 135–145)

## 2022-02-24 LAB — BRAIN NATRIURETIC PEPTIDE: B Natriuretic Peptide: 2853 pg/mL — ABNORMAL HIGH (ref 0.0–100.0)

## 2022-02-24 LAB — TROPONIN I (HIGH SENSITIVITY)
Troponin I (High Sensitivity): 309 ng/L (ref <18)
Troponin I (High Sensitivity): 473 ng/L (ref <18)

## 2022-02-24 LAB — CBG MONITORING, ED: Glucose-Capillary: 167 mg/dL — ABNORMAL HIGH (ref 70–99)

## 2022-02-24 LAB — HEPATITIS B SURFACE ANTIBODY, QUANTITATIVE: Hep B S AB Quant (Post): 533.3 m[IU]/mL (ref >9.9)

## 2022-02-24 MED ORDER — HYDRALAZINE HCL 50 MG PO TABS
100.0000 mg | ORAL_TABLET | Freq: Three times a day (TID) | ORAL | Status: DC
  Administered 2022-02-24 – 2022-02-26 (×4): 100 mg via ORAL
  Filled 2022-02-24 (×4): qty 2
  Filled 2022-02-24: qty 4

## 2022-02-24 MED ORDER — DICLOFENAC SODIUM 1 % EX GEL
4.0000 g | Freq: Four times a day (QID) | CUTANEOUS | Status: DC
Start: 2022-02-24 — End: 2022-02-24

## 2022-02-24 MED ORDER — ISOSORBIDE MONONITRATE ER 60 MG PO TB24
90.0000 mg | ORAL_TABLET | Freq: Every day | ORAL | Status: DC
  Administered 2022-02-24 – 2022-02-26 (×3): 90 mg via ORAL
  Filled 2022-02-24: qty 3
  Filled 2022-02-24 (×2): qty 1

## 2022-02-24 MED ORDER — EZETIMIBE 10 MG PO TABS
10.0000 mg | ORAL_TABLET | Freq: Every day | ORAL | Status: DC
  Administered 2022-02-25 – 2022-02-26 (×2): 10 mg via ORAL
  Filled 2022-02-24 (×2): qty 1

## 2022-02-24 MED ORDER — INSULIN ASPART 100 UNIT/ML IJ SOLN: 0.0000 [IU] | Freq: Three times a day (TID) | INTRAMUSCULAR | Status: DC

## 2022-02-24 MED ORDER — SERTRALINE HCL 50 MG PO TABS
50.0000 mg | ORAL_TABLET | Freq: Every day | ORAL | Status: DC
  Administered 2022-02-24 – 2022-02-26 (×3): 50 mg via ORAL
  Filled 2022-02-24 (×3): qty 1

## 2022-02-24 MED ORDER — INSULIN ASPART 100 UNIT/ML IJ SOLN: 0.0000 [IU] | Freq: Every day | INTRAMUSCULAR | Status: DC

## 2022-02-24 MED ORDER — CHLORHEXIDINE GLUCONATE CLOTH 2 % EX PADS: 6.0000 | MEDICATED_PAD | Freq: Every day | CUTANEOUS | Status: DC

## 2022-02-24 MED ORDER — ARFORMOTEROL TARTRATE 15 MCG/2ML IN NEBU
15.0000 ug | INHALATION_SOLUTION | Freq: Two times a day (BID) | RESPIRATORY_TRACT | Status: DC
  Administered 2022-02-24 – 2022-02-26 (×3): 15 ug via RESPIRATORY_TRACT
  Filled 2022-02-24 (×3): qty 2

## 2022-02-24 MED ORDER — APIXABAN 5 MG PO TABS
5.0000 mg | ORAL_TABLET | Freq: Two times a day (BID) | ORAL | Status: DC
  Administered 2022-02-24 – 2022-02-26 (×4): 5 mg via ORAL
  Filled 2022-02-24 (×4): qty 1

## 2022-02-24 MED ORDER — PANTOPRAZOLE SODIUM 40 MG PO TBEC
40.0000 mg | DELAYED_RELEASE_TABLET | Freq: Two times a day (BID) | ORAL | Status: DC
  Administered 2022-02-24 – 2022-02-26 (×4): 40 mg via ORAL
  Filled 2022-02-24 (×4): qty 1

## 2022-02-24 MED ORDER — CALCIUM ACETATE (PHOS BINDER) 667 MG PO CAPS
667.0000 mg | ORAL_CAPSULE | Freq: Three times a day (TID) | ORAL | Status: DC
  Administered 2022-02-25 – 2022-02-26 (×3): 667 mg via ORAL
  Filled 2022-02-24 (×3): qty 1

## 2022-02-24 MED ORDER — UMECLIDINIUM BROMIDE 62.5 MCG/ACT IN AEPB
1.0000 | INHALATION_SPRAY | Freq: Every day | RESPIRATORY_TRACT | Status: DC
  Administered 2022-02-26: 1 via RESPIRATORY_TRACT
  Filled 2022-02-24: qty 7

## 2022-02-24 MED ORDER — CARVEDILOL 6.25 MG PO TABS
6.2500 mg | ORAL_TABLET | Freq: Two times a day (BID) | ORAL | Status: DC
  Administered 2022-02-24 – 2022-02-26 (×3): 6.25 mg via ORAL
  Filled 2022-02-24 (×2): qty 1
  Filled 2022-02-24: qty 2

## 2022-02-24 MED ORDER — FREESTYLE LIBRE 2 SENSOR MISC
1.0000 | Status: DC
Start: 2022-02-24 — End: 2022-02-24

## 2022-02-24 MED ORDER — HYDRALAZINE HCL 50 MG PO TABS
100.0000 mg | ORAL_TABLET | Freq: Three times a day (TID) | ORAL | Status: DC
Start: 2022-02-24 — End: 2022-02-24
  Filled 2022-02-24 (×2): qty 2

## 2022-02-24 MED ORDER — ATORVASTATIN CALCIUM 80 MG PO TABS
80.0000 mg | ORAL_TABLET | Freq: Every day | ORAL | Status: DC
  Administered 2022-02-25 – 2022-02-26 (×2): 80 mg via ORAL
  Filled 2022-02-24 (×2): qty 1

## 2022-02-24 NOTE — ED Provider Notes (Signed)
[facility] EMERGENCY DEPARTMENT Provider Note   CSN: [id number] Arrival date & time: 02/24/22  1104     History  Chief Complaint  Patient presents with   Altered Mental Status    Kenneth [name] is a 66 y.o. male.  66 year old male with past medical history of CAD, ESRD (dialysis M/W/F, last dialysis was yesterday prior to leaving the hospital), paroxysmal A-fib on Eliquis, CHF, diabetes with insulin pump.  Brought in by EMS from home where he states he was short of breath and having chest pain.  Patient's wife is at bedside and notes that his face was very dark in color and this has improved.  EMS reports O2 sat 88% on room air, improved with 2 L nasal cannula supplemental.  Does not make urine.       Home Medications Prior to Admission medications   Medication Sig Start Date End Date Taking? Authorizing Provider  amoxicillin-clavulanate (AUGMENTIN) 500-125 MG tablet Take 1 tablet (500 mg total) by mouth daily for 3 days. 02/23/22 02/26/22  Darliss Cheney, MD  apixaban (ELIQUIS) 5 MG TABS tablet Take 1 tablet by mouth 2 times daily 10/17/21   Larey Dresser, MD  atorvastatin (LIPITOR) 80 MG tablet Take 1 tablet (80 mg total) by mouth daily. 10/16/21   Larey Dresser, MD  calcium acetate (PHOSLO) 667 MG capsule Take 667 mg by mouth 3 (three) times daily with meals.  07/09/17   [provider]  carvedilol (COREG) 6.25 MG tablet Take 1 tablet (6.25 mg total) by mouth 2 (two) times daily with a meal. Hold on the mornings of dialysis. 02/10/22   Shelda Pal, DO  Continuous Blood Gluc Sensor (FREESTYLE LIBRE 2 SENSOR) MISC 1 Device by Does not apply route every 14 (fourteen) days. 10/28/21   Shamleffer, Melanie Crazier, MD  diclofenac Sodium (VOLTAREN) 1 % GEL Apply 4 grams topically 4 (four) times daily. 09/16/21   Shelda Pal, DO  doxycycline (VIBRA-TABS) 100 MG tablet Take 1 tablet (100 mg total) by mouth 2 (two) times daily for 5 days. 02/23/22  02/28/22  Darliss Cheney, MD  ezetimibe (ZETIA) 10 MG tablet Take 1 tablet by mouth daily 05/20/21   Larey Dresser, MD  glucose blood [facility] VERIO) test strip 1 each by Other route in the morning, at noon, in the evening, and at bedtime. Use as instructed 01/01/22   Shamleffer, Melanie Crazier, MD  hydrALAZINE (APRESOLINE) 100 MG tablet Take 1 tablet (100 mg total) by mouth 3 (three) times daily. DO NOT TAKE AM AND NOON DOSES ON HD DAYS 09/16/21   Shelda Pal, DO  Insulin Human (INSULIN PUMP) SOLN Inject 1 each into the skin continuous. NOVOLOG - 20 units    [provider]  Insulin Lispro-aabc (LYUMJEV) 100 UNIT/ML SOLN Max Daily 30 units per insulin pump 01/06/22   Shamleffer, Melanie Crazier, MD  Insulin Pen Needle (PEN NEEDLES) 32G X 6 MM MISC Use as directed 3 times daily 12/18/21   Shamleffer, Melanie Crazier, MD  isosorbide mononitrate (IMDUR) 60 MG 24 hr tablet Take 1&1/2 tablets (90 mg total) by mouth daily. Hold on days of dialysis on Mon, Wed, Fri. 02/10/22   Larey Dresser, MD  Methoxy PEG-Epoetin Beta (MIRCERA IJ) Mircera 03/17/21 03/16/22  [provider]  pantoprazole (PROTONIX) 40 MG tablet Take 1 tablet (40 mg total) by mouth 2 (two) times daily. 10/23/21   Hunsucker, Bonna Gains, MD  sertraline (ZOLOFT) 50 MG tablet Take 1  tablet (50 mg total) by mouth daily. 11/24/21   Shelda Pal, DO  Sodium Zirconium Cyclosilicate (LOKELMA PO) Take 1 packet by mouth 4 (four) times a week. Hold on dialysis days    [provider]  Tiotropium Bromide-Olodaterol (STIOLTO RESPIMAT) 2.5-2.5 MCG/ACT AERS Inhale 2 puffs by mouth into the lungs daily. 01/20/22   Hunsucker, Bonna Gains, MD      Allergies    Patient has no known allergies.    Review of Systems   Review of Systems Negative except as per HPI Physical Exam Updated Vital Signs BP 138/60   Pulse 65   Temp 98.1 F (36.7 C) (Oral)   Resp 18   SpO2 100%  Physical Exam Vitals and nursing note  reviewed.  Constitutional:      General: He is not in acute distress.    Appearance: He is well-developed. He is not diaphoretic.     Comments: Sleeping, arouses to verbal stimuli  HENT:     Head: Normocephalic and atraumatic.     Mouth/Throat:     Mouth: Mucous membranes are moist.  Cardiovascular:     Rate and Rhythm: Normal rate and regular rhythm.     Heart sounds: Normal heart sounds.  Pulmonary:     Effort: Pulmonary effort is normal.     Breath sounds: Normal breath sounds.  Abdominal:     Palpations: Abdomen is soft.     Tenderness: There is no abdominal tenderness.  Musculoskeletal:     Right lower leg: No edema.     Left lower leg: No edema.  Skin:    General: Skin is warm and dry.     Findings: No erythema or rash.  Neurological:     Mental Status: He is alert and oriented to person, place, and time.     Sensory: No sensory deficit.     Motor: No weakness.  Psychiatric:        Behavior: Behavior normal.     ED Results / Procedures / Treatments   Labs (all labs ordered are listed, but only abnormal results are displayed) Labs Reviewed  BASIC METABOLIC PANEL - Abnormal; Notable for the following components:      Result Value   Sodium 133 (*)    Chloride 94 (*)    Glucose, Bld 217 (*)    BUN 26 (*)    Creatinine, Ser 7.23 (*)    Calcium 8.8 (*)    GFR, Estimated 8 (*)    All other components within normal limits  CBC WITH DIFFERENTIAL/PLATELET - Abnormal; Notable for the following components:   RBC 2.52 (*)    Hemoglobin 7.8 (*)    HCT 23.7 (*)    All other components within normal limits  BRAIN NATRIURETIC PEPTIDE - Abnormal; Notable for the following components:   B Natriuretic Peptide 2,853.0 (*)    All other components within normal limits  TROPONIN I (HIGH SENSITIVITY) - Abnormal; Notable for the following components:   Troponin I (High Sensitivity) 309 (*)    All other components within normal limits  TROPONIN I (HIGH SENSITIVITY)    EKG EKG  Interpretation  Date/Time:  Tuesday February 24 2022 12:30:59 EDT Ventricular Rate:  63 PR Interval:  194 QRS Duration: 157 QT Interval:  495 QTC Calculation: 507 R Axis:   69 Text Interpretation: Sinus rhythm Probable left atrial enlargement Right bundle branch block Inferior infarct, age indeterminate ST depressions in the lateral leads similar to prior Confirmed by Regan Lemming (  [id number]) on 02/24/2022 3:20:07 PM  Radiology DG Chest Port 1 View  Result Date: 02/24/2022 CLINICAL DATA:  SHOB EXAM: PORTABLE CHEST 1 VIEW COMPARISON:  February 21, 2022 FINDINGS: Again seen are sternotomy wires, and mediastinal surgical clips. Heart is borderline. Atheromatous calcifications of the arch of the aorta. There is minor atelectasis at the left lung base. Mild central pulmonary vascular congestion without significant interval change. No pleural effusion or consolidation. The visualized skeletal structures are unremarkable. IMPRESSION: Minor atelectasis at the left lung base. Mild central pulmonary vascular congestion, stable. Electronically Signed   By: Frazier Richards M.D.   On: 02/24/2022 12:09    Procedures Procedures    Medications Ordered in ED Medications - No data to display  ED Course/ Medical Decision Making/ A&P                           Medical Decision Making Amount and/or Complexity of Data Reviewed Labs: ordered. Radiology: ordered.  Risk Decision regarding hospitalization.   This patient presents to the ED for concern of shortness of breath, chest discomfort, darker skin, this involves an extensive number of treatment options, and is a complaint that carries with it a high risk of complications and morbidity.  The differential diagnosis includes but not limited to hypoxia, CHF, electrolyte disturbance, ACS   Co morbidities that complicate the patient evaluation  ESRD (dialysis M/W/F), recent admission for pneumonia requiring supplemental O2, discharged home yesterday after dialysis  complete.   Additional history obtained:  Additional history obtained from EMS who notes O2 sat 88% on room air, improved with 2 L nasal cannula Wife at bedside who notes patient's skin was darker than usual today, this has since improved since supplemental O2 applied.  States that he complained of discomfort in his chest and trouble breathing which prompted her to call 911. External records from outside source obtained and reviewed including discharge summary dated 02/23/2022, admission for pneumonia, at time of discharge patient was to complete dialysis for the day, with no leg requiring supplemental O2.   Lab Tests:  I Ordered, and personally interpreted labs.  The pertinent results include: CBC with normal white count, hemoglobin 7.8, on trend with prior.  BNP is elevated at 2000 853.  Troponin elevated at 309, similar to recent.  BMP with creatinine 7.23, on trend with prior ESRD.   Imaging Studies ordered:  I ordered imaging studies including chest x-ray I independently visualized and interpreted imaging which showed increased interstitial markings I agree with the radiologist interpretation however note when compared to most recent chest x-ray, today's image does appear somewhat more overloaded than prior.   Cardiac Monitoring: / EKG:  The patient was maintained on a cardiac monitor.  I personally viewed and interpreted the cardiac monitored which showed an underlying rhythm of: sinus rhythm rate 63   Consultations Obtained:  I requested consultation with the hospitalist, Dr. Avon Gully,  and discussed lab and imaging findings as well as pertinent plan - they recommend: Admission, admitting team will discuss with nephrology.   Problem List / ED Course / Critical interventions / Medication management  66 year old male brought in by EMS from home with concern for hypoxia, chest pain and shortness of breath.  Found to have O2 sat 88% with EMS, improved with supplemental O2.  Labs  with elevated BNP compared to prior otherwise no significant change from prior.  Chest x-ray, questionably worse compared to prior, last had dialysis yesterday, no lower  extremity edema. Concern for new/ongoing supplemental O2 requirement, discussed with hospitalist who will consult for admission. I ordered medication including supplemental O2   for hypoxia  Reevaluation of the patient after these medicines showed that the patient stayed the same I have reviewed the patients home medicines and have made adjustments as needed   Social Determinants of Health:  Lives with family, attends dialysis Monday/Wednesday/Friday   Test / Admission - Considered:  Admit for supplemental O2, further work-up and monitoring.         Final Clinical Impression(s) / ED Diagnoses Final diagnoses:  Hypoxia  Shortness of breath  ESRD (end stage renal disease) [facility])    Rx / DC Orders ED Discharge Orders     None         Tacy Learn, PA-C 02/24/22 1520    Regan Lemming, MD 02/24/22 1525

## 2022-02-24 NOTE — Progress Notes (Signed)
Heart Failure Navigator Progress Note  Assessed for Heart & Vascular TOC clinic readiness.  Patient does not meet criteria due to already established with Advanced Heart Failure Team. .    Earnestine Leys, BSN, RN Heart Failure Nurse Navigator Secure Chat Only

## 2022-02-24 NOTE — Progress Notes (Addendum)
[facility] Progress Note  Subjective: pt admitted 6/10- 6/12 for PNA and dc'd yesterday. Returns today for fatigue and some AMS. EMS found pt to be hypoxic. Pt to be admitted again, asked to see for dialysis.   Pt seen in ED.  No SOB , some cough, no leg swelling.    Vitals:   02/24/22 1600 02/24/22 1630 02/24/22 1700 02/24/22 1730  BP: (!) 146/56 (!) 152/62 (!) 142/57 (!) 142/49  Pulse: 62 63 63 61  Resp: (!) 21 15 17 14   Temp:      TempSrc:      SpO2: 96% 98% 94% 97%    Exam:  alert, nad , no distress  no jvd  Chest cta bilat  Cor reg no RG  Abd soft ntnd no ascites   Ext no LE edema   Alert, NF, ox3   L AVF +B/T    OP HD: [facility]  MWF  3h 53min  400/500   58kg  2/2 bath  Hep none   L AVF Aranesp 160 mcg weekly (dose recently raised) - last 02/18/22 (149mcg) Hectorol 74mcg IV qHD Sensipar 90mg  with HD  Assessment/ Plan: AMS - not sure cause, per pmd Hypoxia - CXR not impressive for edema and looks like his baseline. Will see if we can effect any change in volume status w/ HD tomorrow.  ESRD - MWF HD. Has not missed HD. Hd tomorrow.  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   Inpatient medications:  apixaban  5 mg Oral BID   arformoterol  15 mcg Nebulization BID   And   umeclidinium bromide  1 puff Inhalation Daily   atorvastatin  80 mg Oral Daily   [START ON 02/25/2022] calcium acetate  667 mg Oral TID WC   carvedilol  6.25 mg Oral BID WC   diclofenac Sodium  4 g Topical QID    [START ON 02/25/2022] ezetimibe  10 mg Oral Daily   FreeStyle Libre 2 Sensor  1 Device Does not apply Q14 Days   hydrALAZINE  100 mg Oral TID   isosorbide mononitrate  90 mg Oral Daily   pantoprazole  40 mg Oral BID   sertraline  50 mg Oral Daily

## 2022-02-24 NOTE — H&P (Signed)
History and Physical    Kenneth [name] ESP:[id number] DOB: 1956/04/06 DOA: 02/24/2022  PCP: Shelda Pal, DO   Chief Complaint: Hypoxia, shortness of breath  HPI: Kenneth [name] is a 66 y.o. male with medical history significant for ESRD Monday Wednesday Friday, CAD status post CABG with drug-eluting stent, heart failure with preserved ejection fraction, paroxysmal A-fib on Eliquis, COPD on room air at baseline, insulin-dependent diabetes type 2, essential hypertension, hyperlipidemia, chronic anemia of chronic disease, seizure disorder and history of depression.  Patient presents to the ED after being discharged just 24 hours ago for episodes of what appear to be altered mental status found to be hypoxic by EMS requiring upwards of 2 L nasal cannula and intake.  Patient's recent hospitalization earlier this week was for similar episode of hypoxia with negative x-ray CTA at the time was negative for PE with trace pleural fluid and bilateral groundglass opacities.  Patient was treated at that time for pneumonia and discharged on appropriate antibiotics.  Patient's return now for similar symptoms and does not meet sepsis criteria, concern for volume overload at this point.  Nephrology has been consulted and hospitalist called for admission.  Review of Systems: As per HPI altered mental status, dyspnea at rest worse with exertion, denies chest pain nausea vomiting diarrhea constipation fevers or chills.  No sick contacts or recent travel (patient was recently hospitalized)  Assessment/Plan Principal Problem:   Acute respiratory failure with hypoxia (HCC)    Acute hypoxic respiratory failure, multifactorial Concurrent volume overload in the setting of ESRD on dialysis as well as preserved ejection fraction heart failure COPD history, rule out acute exacerbation Pneumonia unlikely -Patient appears volume overloaded, imaging shows likely edematous lung volumes, rales on exam -Unfortunately  given ESRD status cannot tolerate diuretics, nephrology consulted to assist with volume management -Unclear if patient has been noncompliant at home with volume intake -COPD exacerbation less likely given no wheeze - only rales -hold off on steroids/antibiotics -Continue home inhalers including umeclidinium -Patient admitted earlier this week with presumed pneumonia, treated appropriately at that time with IV/PO antibiotics - we will follow clinically as patient does not meet SIRS or sepsis criteria at this time.  Acute metabolic encephalopathy Rule out breakthrough seizure  given chronic seizure disorder -Home med rec does not appear to contain any antiepileptic medications, no need to confirm with pharmacy -We will attempt to call family again later to confirm patient's transient altered mental status, likely secondary to hypoxia given above but at this point may need further work-up in the outpatient setting with neurology if appropriate  A-fib, paroxysmal on Eliquis CAD status post CABG/DES Hypertension, essential Hyperlipidemia -Continue home Eliquis, heart rate currently well controlled on  carvedilol -Continue core measures including home statin, Zetia, isosorbide  IDDM2 -uncontrolled with hyperglycemia - A1C 9.0 - Continue sliding scale insulin, hypoglycemic protocol  Chronic anemia of chronic disease -Continue to follow, holding outpatient Mircera(EPO) -can resume at discharge/with dialysis per nephrology  DVT prophylaxis: Eliquis Code Status: Full Family Communication: None present Status is: Inpatient  Dispo: The patient is from: Home              Anticipated d/c is to: To be determined              Anticipated d/c date is: 40 to 72 hours              Patient currently not medically stable for discharge pending above  Consultants:  Nephrology  Procedures:  None  Past Medical History:  Diagnosis Date   CAD (coronary artery disease) 10/30/2016   NSTEMI 9/17 with  CABG x 3 (LIMA-LAD, SVG-OM, SVG-RCA).   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   type 1 DM   ESRD on hemodialysis ([place])    "MWF; [facility]" (03/30/2018)   Gastroesophageal reflux disease 08/18/2016   Hyperlipidemia    Hypertension    Ischemic rest pain of lower extremity 02/06/2015   Keratoma 02/27/2015   Metatarsal deformity 02/27/2015   PAF (paroxysmal atrial fibrillation) (HCC)    Pronation deformity of both feet 02/27/2015    Past Surgical History:  Procedure Laterality Date   A/V FISTULAGRAM Left 03/06/2021   Procedure: A/V FISTULAGRAM;  Surgeon: Marty Heck, MD;  Location: [place] [facility];  Service: Cardiovascular;  Laterality: Left;   AV FISTULA PLACEMENT Left 06/22/2016   Procedure: ARTERIOVENOUS (AV) FISTULA CREATION LEFT UPPER ARM;  Surgeon: Rosetta Posner, MD;  Location: [place];  Service: Vascular;  Laterality: Left;   BIOPSY  12/19/2020   Procedure: BIOPSY;  Surgeon: Ladene Artist, MD;  Location: [place];  Service: Endoscopy;;   CARDIAC CATHETERIZATION N/A 05/31/2016   Procedure: Left Heart Cath and Coronary Angiography;  Surgeon: Jettie Booze, MD;  Location: [place] [facility];  Service: Cardiovascular;  Laterality: N/A;   CARDIAC CATHETERIZATION N/A 05/31/2016   Procedure: Right Heart Cath;  Surgeon: Jettie Booze, MD;  Location: [place] [facility];  Service: Cardiovascular;  Laterality: N/A;   CARDIAC CATHETERIZATION N/A 05/31/2016   Procedure: IABP Insertion;  Surgeon: Jettie Booze, MD;  Location: [place] [facility];  Service: Cardiovascular;  Laterality: N/A;   COLONOSCOPY WITH PROPOFOL N/A 12/20/2020   Procedure: COLONOSCOPY WITH PROPOFOL;  Surgeon: Mauri Pole, MD;  Location: [place] [facility];  Service: Endoscopy;  Laterality: N/A;   CORONARY ARTERY BYPASS GRAFT N/A 06/05/2016   Procedure:  CORONARY ARTERY BYPASS GRAFTING (CABG) x3 LIMA to LAD -SVG to OM -SVG to RCA;  Surgeon: Ivin Poot, MD;  Location: [facility] [place];  Service: Open Heart Surgery;  Laterality: N/A;   CORONARY STENT INTERVENTION N/A 07/07/2017   Procedure: CORONARY STENT INTERVENTION;  Surgeon: Wellington Hampshire, MD;  Location: [place] [facility];  Service: Cardiovascular;  Laterality: N/A;   CORONARY STENT INTERVENTION N/A 03/30/2018   Procedure: CORONARY STENT INTERVENTION;  Surgeon: Martinique, Peter M, MD;  Location: [place] [facility];  Service: Cardiovascular;  Laterality: N/A;   ESOPHAGOGASTRODUODENOSCOPY (EGD) WITH PROPOFOL N/A 12/19/2020   Procedure: ESOPHAGOGASTRODUODENOSCOPY (EGD) WITH PROPOFOL;  Surgeon: Ladene Artist, MD;  Location[facility];  Service: Endoscopy;  Laterality: N/A;   GIVENS CAPSULE STUDY N/A 12/20/2020   Procedure: GIVENS CAPSULE STUDY;  Surgeon: Mauri Pole, MD;  Location: [place];  Service: Endoscopy;  Laterality: N/A;   INSERTION OF DIALYSIS CATHETER N/A 06/05/2016   Procedure: INSERTION OF DIALYSIS/trialysis CATHETER;  Surgeon: Ivin Poot, MD;  Location: [facility];  Service: Vascular;  Laterality: N/A;   INSERTION OF DIALYSIS CATHETER Right 06/13/2016   Procedure: INSERTION OF DIALYSIS CATHETER RIGHT INTERNAL JUGULAR;  Surgeon: Conrad [place] Lake, MD;  Location: [place];  Service: Vascular;  Laterality: Right;   INSERTION OF DIALYSIS CATHETER Right 09/07/2019   Procedure: Insertion Of Dialysis Catheter;  Surgeon: Rosetta Posner, MD;  Location: [facility]  OR;  Service: Vascular;  Laterality: Right;   INTRAOPERATIVE TRANSESOPHAGEAL ECHOCARDIOGRAM N/A 06/05/2016   Procedure: INTRAOPERATIVE TRANSESOPHAGEAL ECHOCARDIOGRAM;  Surgeon: Ivin Poot, MD;  Location: [place];  Service: Open Heart Surgery;  Laterality: N/A;   LEFT HEART CATH AND CORS/GRAFTS ANGIOGRAPHY N/A 11/03/2016   Procedure: Left Heart Cath and Cors/Grafts Angiography;  Surgeon: Troy Sine, MD;  Location: [place] [facility];   Service: Cardiovascular;  Laterality: N/A;   LEFT HEART CATH AND CORS/GRAFTS ANGIOGRAPHY N/A 07/07/2017   Procedure: LEFT HEART CATH AND CORS/GRAFTS ANGIOGRAPHY;  Surgeon: Larey Dresser, MD;  Location: [place] [facility];  Service: Cardiovascular;  Laterality: N/A;   LEFT HEART CATH AND CORS/GRAFTS ANGIOGRAPHY N/A 03/30/2018   Procedure: LEFT HEART CATH AND CORS/GRAFTS ANGIOGRAPHY;  Surgeon: Martinique, Peter M, MD;  Location: [place] [facility];  Service: Cardiovascular;  Laterality: N/A;   LEFT HEART CATH AND CORS/GRAFTS ANGIOGRAPHY N/A 04/17/2019   Procedure: LEFT HEART CATH AND CORS/GRAFTS ANGIOGRAPHY;  Surgeon: Larey Dresser, MD;  Location: [place] [facility];  Service: Cardiovascular;  Laterality: N/A;   PERIPHERAL VASCULAR INTERVENTION Left 03/06/2021   Procedure: PERIPHERAL VASCULAR INTERVENTION;  Surgeon: Marty Heck, MD;  Location: [place] [facility];  Service: Cardiovascular;  Laterality: Left;   POLYPECTOMY  12/20/2020   Procedure: POLYPECTOMY;  Surgeon: Mauri Pole, MD;  Location: [place];  Service: Endoscopy;;   REVISION OF ARTERIOVENOUS GORETEX GRAFT Left 02/54/2706   Procedure: PLICATION OF ANEURYSM OF ARTERIOVENOUS FISTULA  LEFT ARM;  Surgeon: Rosetta Posner, MD;  Location: [place];  Service: Vascular;  Laterality: Left;   REVISON OF ARTERIOVENOUS FISTULA Left 09/07/2019   Procedure: REVISON OF LEFT UPPER ARM ARTERIOVENOUS FISTULA;  Surgeon: Rosetta Posner, MD;  Location: [facility];  Service: Vascular;  Laterality: Left;   RIGHT/LEFT HEART CATH AND CORONARY ANGIOGRAPHY N/A 01/03/2021   Procedure: RIGHT/LEFT HEART CATH AND CORONARY ANGIOGRAPHY;  Surgeon: Larey Dresser, MD;  Location: [place] [facility];  Service: Cardiovascular;  Laterality: N/A;     reports that he quit smoking about 9 years ago. His smoking use included cigarettes. He has a 52.50 pack-year smoking history. He has never used smokeless tobacco. He reports that he does not drink alcohol and does not use  drugs.  No Known Allergies  Family History  Problem Relation Age of Onset   CAD Father    Colon cancer Father    Diabetes Brother     Prior to Admission medications   Medication Sig Start Date End Date Taking? Authorizing Provider  amoxicillin-clavulanate (AUGMENTIN) 500-125 MG tablet Take 1 tablet (500 mg total) by mouth daily for 3 days. 02/23/22 02/26/22  Darliss Cheney, MD  apixaban (ELIQUIS) 5 MG TABS tablet Take 1 tablet by mouth 2 times daily 10/17/21   Larey Dresser, MD  atorvastatin (LIPITOR) 80 MG tablet Take 1 tablet (80 mg total) by mouth daily. 10/16/21   Larey Dresser, MD  calcium acetate (PHOSLO) 667 MG capsule Take 667 mg by mouth 3 (three) times daily with meals.  07/09/17   [provider]  carvedilol (COREG) 6.25 MG tablet Take 1 tablet (6.25 mg total) by mouth 2 (two) times daily with a meal. Hold on the mornings of dialysis. 02/10/22   Shelda Pal, DO  Continuous Blood Gluc Sensor (FREESTYLE LIBRE 2 SENSOR) MISC 1 Device by Does not apply route every 14 (fourteen) days. 10/28/21   Shamleffer, Melanie Crazier, MD  diclofenac Sodium (VOLTAREN) 1 %  GEL Apply 4 grams topically 4 (four) times daily. 09/16/21   Shelda Pal, DO  doxycycline (VIBRA-TABS) 100 MG tablet Take 1 tablet (100 mg total) by mouth 2 (two) times daily for 5 days. 02/23/22 02/28/22  Darliss Cheney, MD  ezetimibe (ZETIA) 10 MG tablet Take 1 tablet by mouth daily 05/20/21   Larey Dresser, MD  glucose blood [facility] VERIO) test strip 1 each by Other route in the morning, at noon, in the evening, and at bedtime. Use as instructed 01/01/22   Shamleffer, Melanie Crazier, MD  hydrALAZINE (APRESOLINE) 100 MG tablet Take 1 tablet (100 mg total) by mouth 3 (three) times daily. DO NOT TAKE AM AND NOON DOSES ON HD DAYS 09/16/21   Shelda Pal, DO  Insulin Human (INSULIN PUMP) SOLN Inject 1 each into the skin continuous. NOVOLOG - 20 units    [provider]  Insulin  Lispro-aabc (LYUMJEV) 100 UNIT/ML SOLN Max Daily 30 units per insulin pump 01/06/22   Shamleffer, Melanie Crazier, MD  Insulin Pen Needle (PEN NEEDLES) 32G X 6 MM MISC Use as directed 3 times daily 12/18/21   Shamleffer, Melanie Crazier, MD  isosorbide mononitrate (IMDUR) 60 MG 24 hr tablet Take 1&1/2 tablets (90 mg total) by mouth daily. Hold on days of dialysis on Mon, Wed, Fri. 02/10/22   Larey Dresser, MD  Methoxy PEG-Epoetin Beta (MIRCERA IJ) Mircera 03/17/21 03/16/22  [provider]  pantoprazole (PROTONIX) 40 MG tablet Take 1 tablet (40 mg total) by mouth 2 (two) times daily. 10/23/21   Hunsucker, Bonna Gains, MD  sertraline (ZOLOFT) 50 MG tablet Take 1 tablet (50 mg total) by mouth daily. 11/24/21   Shelda Pal, DO  Sodium Zirconium Cyclosilicate (LOKELMA PO) Take 1 packet by mouth 4 (four) times a week. Hold on dialysis days    [provider]  Tiotropium Bromide-Olodaterol (STIOLTO RESPIMAT) 2.5-2.5 MCG/ACT AERS Inhale 2 puffs by mouth into the lungs daily. 01/20/22   Hunsucker, Bonna Gains, MD    Physical Exam: Vitals:   02/24/22 1230 02/24/22 1345 02/24/22 1400 02/24/22 1430  BP: (!) 126/53 (!) 120/54 (!) 123/55 (!) 127/53  Pulse: 63 61 61 (!) 59  Resp: 17 18  19   Temp:      TempSrc:      SpO2: 100% 97% 97% 96%    Constitutional: NAD, calm, comfortable Vitals:   02/24/22 1230 02/24/22 1345 02/24/22 1400 02/24/22 1430  BP: (!) 126/53 (!) 120/54 (!) 123/55 (!) 127/53  Pulse: 63 61 61 (!) 59  Resp: 17 18  19   Temp:      TempSrc:      SpO2: 100% 97% 97% 96%   General:  Pleasantly resting in bed, No acute distress. Lungs: Bibasilar rales right greater than left Heart: Without murmur rubs or gallops. Abdomen:  Soft, nontender, nondistended.  Without guarding or rebound. Extremities: Without cyanosis, clubbing, edema, or obvious deformity.  Labs on Admission: I have personally reviewed following labs and imaging studies  CBC: Recent Labs  Lab  02/21/22 1250 02/21/22 1300 02/22/22 0344 02/23/22 0231 02/24/22 1243  WBC 6.7  --  5.5 6.1 6.3  NEUTROABS  --   --   --   --  4.5  HGB 8.8* 8.8* 7.2* 7.6* 7.8*  HCT 25.9* 26.0* 21.0* 22.3* 23.7*  MCV 92.8  --  93.3 92.1 94.0  PLT 187  --  157 188 161   Basic Metabolic Panel: Recent Labs  Lab 02/21/22 1250 02/21/22 1300  02/22/22 0344 02/23/22 0231 02/24/22 1243  NA 132* 131* 131* 125* 133*  K 3.2* 3.3* 3.4* 3.4* 3.9  CL 91*  --  91* 89* 94*  CO2 31  --  28 25 26   GLUCOSE 252*  --  196* 159* 217*  BUN 23  --  26* 35* 26*  CREATININE 5.73*  --  6.89* 8.68* 7.23*  CALCIUM 8.3*  --  7.9* 8.1* 8.8*  PHOS  --   --   --  2.5  --    GFR: Estimated Creatinine Clearance: 7.6 mL/min (A) (by C-G formula based on SCr of 7.23 mg/dL (H)). Liver Function Tests: Recent Labs  Lab 02/21/22 1250 02/23/22 0231  AST 23  --   ALT 13  --   ALKPHOS 84  --   BILITOT 1.1  --   PROT 7.3  --   ALBUMIN 3.1* 2.3*   Recent Labs  Lab 02/21/22 1250  LIPASE 32   Recent Labs  Lab 02/21/22 1404  AMMONIA 14   Coagulation Profile: No results for input(s): "INR", "PROTIME" in the last 168 hours. Cardiac Enzymes: Recent Labs  Lab 02/21/22 1250  CKTOTAL 81   BNP (last 3 results) No results for input(s): "PROBNP" in the last 8760 hours. HbA1C: No results for input(s): "HGBA1C" in the last 72 hours. CBG: Recent Labs  Lab 02/22/22 2114 02/22/22 2144 02/23/22 0238 02/23/22 0632 02/23/22 1243  GLUCAP 198* 193* 172* 181* 182*   Lipid Profile: No results for input(s): "CHOL", "HDL", "LDLCALC", "TRIG", "CHOLHDL", "LDLDIRECT" in the last 72 hours. Thyroid Function Tests: No results for input(s): "TSH", "T4TOTAL", "FREET4", "T3FREE", "THYROIDAB" in the last 72 hours. Anemia Panel: No results for input(s): "VITAMINB12", "FOLATE", "FERRITIN", "TIBC", "IRON", "RETICCTPCT" in the last 72 hours. Urine analysis:    Component Value Date/Time   COLORURINE YELLOW 10/30/2016 2339    APPEARANCEUR CLEAR 10/30/2016 2339   LABSPEC 1.010 10/30/2016 2339   PHURINE 5.0 10/30/2016 2339   GLUCOSEU NEGATIVE 10/30/2016 2339   HGBUR NEGATIVE 10/30/2016 [place] 10/30/2016 2339   [place] 10/30/2016 2339   PROTEINUR 100 (A) 10/30/2016 2339   NITRITE NEGATIVE 10/30/2016 2339   LEUKOCYTESUR NEGATIVE 10/30/2016 2339    Radiological Exams on Admission: DG Chest Port 1 View  Result Date: 02/24/2022 CLINICAL DATA:  SHOB EXAM: PORTABLE CHEST 1 VIEW COMPARISON:  February 21, 2022 FINDINGS: Again seen are sternotomy wires, and mediastinal surgical clips. Heart is borderline. Atheromatous calcifications of the arch of the aorta. There is minor atelectasis at the left lung base. Mild central pulmonary vascular congestion without significant interval change. No pleural effusion or consolidation. The visualized skeletal structures are unremarkable. IMPRESSION: Minor atelectasis at the left lung base. Mild central pulmonary vascular congestion, stable. Electronically Signed   By: Frazier Richards M.D.   On: 02/24/2022 12:09    EKG: Independently reviewed. Sinus w/ notable RBBB   Little Ishikawa DO [facility] For contact please use secure messenger on Epic  If 7PM-7AM, please contact night-coverage located on [email or web address]   02/24/2022, 3:01 PM

## 2022-02-24 NOTE — ED Triage Notes (Signed)
Pt BIB [facility] from home d/t continues issues after getting d/c from hospital yesterday d/t pneumonia. EMS reports he is very lethargic, poor oral intake today, Rales in bil lower lobes, felt warm to the touch, wife repots he was altered. 88% on RA & 2L O2 via n/c applied. 24 Resp, SBP 100 palpated, CBG 287, 12L unremarkable, denies CP, Left arm restrict d/t dialysis access, does have an insulin pump, 20g PIV in Rt AC. Speaks [place] with minimal english.

## 2022-02-25 LAB — BASIC METABOLIC PANEL
Anion gap: 7 (ref 5–15)
BUN: 29 mg/dL — ABNORMAL HIGH (ref 8–23)
CO2: 27 mmol/L (ref 22–32)
Calcium: 8.4 mg/dL — ABNORMAL LOW (ref 8.9–10.3)
Chloride: 98 mmol/L (ref 98–111)
Creatinine, Ser: 8.63 mg/dL — ABNORMAL HIGH (ref 0.61–1.24)
GFR, Estimated: 6 mL/min — ABNORMAL LOW (ref >60)
Glucose, Bld: 134 mg/dL — ABNORMAL HIGH (ref 70–99)
Potassium: 3.6 mmol/L (ref 3.5–5.1)
Sodium: 132 mmol/L — ABNORMAL LOW (ref 135–145)

## 2022-02-25 LAB — CBC
HCT: 23 % — ABNORMAL LOW (ref 39.0–52.0)
Hemoglobin: 7.8 g/dL — ABNORMAL LOW (ref 13.0–17.0)
MCH: 31.3 pg (ref 26.0–34.0)
MCHC: 33.9 g/dL (ref 30.0–36.0)
MCV: 92.4 fL (ref 80.0–100.0)
Platelets: 217 10*3/uL (ref 150–400)
RBC: 2.49 MIL/uL — ABNORMAL LOW (ref 4.22–5.81)
RDW: 14.4 % (ref 11.5–15.5)
WBC: 5.8 10*3/uL (ref 4.0–10.5)
nRBC: 0 % (ref 0.0–0.2)

## 2022-02-25 LAB — PHOSPHORUS: Phosphorus: 2.9 mg/dL (ref 2.5–4.6)

## 2022-02-25 LAB — GLUCOSE, CAPILLARY
Glucose-Capillary: 94 mg/dL (ref 70–99)
Glucose-Capillary: 213 mg/dL — ABNORMAL HIGH (ref 70–99)
Glucose-Capillary: 131 mg/dL — ABNORMAL HIGH (ref 70–99)

## 2022-02-25 MED ORDER — LEVETIRACETAM 500 MG PO TABS
500.0000 mg | ORAL_TABLET | Freq: Every day | ORAL | Status: DC
  Administered 2022-02-25: 500 mg via ORAL
  Filled 2022-02-25: qty 1

## 2022-02-25 MED ORDER — DARBEPOETIN ALFA 150 MCG/0.3ML IJ SOSY: 150.0000 ug | PREFILLED_SYRINGE | INTRAMUSCULAR | Status: DC

## 2022-02-25 MED ORDER — LEVETIRACETAM 250 MG PO TABS
250.0000 mg | ORAL_TABLET | ORAL | Status: DC
  Administered 2022-02-25: 250 mg via ORAL
  Filled 2022-02-25: qty 1

## 2022-02-25 MED ORDER — DOXERCALCIFEROL 4 MCG/2ML IV SOLN: 2.0000 ug | INTRAVENOUS | Status: DC

## 2022-02-25 NOTE — Progress Notes (Signed)
[facility] Progress Note   Subjective:   Reportedly was upset at the start of HD because he wanted to increase his UF goal. RN reports he was ok with current goal after talking to MD. He was sleeping on HD when I saw him, opened eyes to voice and smiled then went back to sleep.   Objective Vitals:   02/25/22 0832 02/25/22 0900 02/25/22 0930 02/25/22 1000  BP: (!) 141/52 (!) 147/49 (!) 149/54 (!) 134/49  Pulse: 64 63 63 (!) 59  Resp: 15 15 15 12   Temp:      TempSrc:      SpO2: 94% 97% 98% 98%  Weight:       Physical Exam General: Sleeping male in NAD Heart: RRR, no murmurs, rubs or gallops Lungs: CTA anteriorly without wheezing, rhonchi or rales Abdomen: Soft, non-distended, +BS Extremities: No edema b/l lower extremities Dialysis Access: LUE AVF accessed  Additional Objective Labs: Basic Metabolic Panel: Recent Labs  Lab 02/23/22 0231 02/24/22 1243 02/25/22 0414  NA 125* 133* 132*  K 3.4* 3.9 3.6  CL 89* 94* 98  CO2 25 26 27   GLUCOSE 159* 217* 134*  BUN 35* 26* 29*  CREATININE 8.68* 7.23* 8.63*  CALCIUM 8.1* 8.8* 8.4*  PHOS 2.5  --  2.9   Liver Function Tests: Recent Labs  Lab 02/21/22 1250 02/23/22 0231  AST 23  --   ALT 13  --   ALKPHOS 84  --   BILITOT 1.1  --   PROT 7.3  --   ALBUMIN 3.1* 2.3*   Recent Labs  Lab 02/21/22 1250  LIPASE 32   CBC: Recent Labs  Lab 02/21/22 1250 02/21/22 1300 02/22/22 0344 02/23/22 0231 02/24/22 1243 02/25/22 0414  WBC 6.7  --  5.5 6.1 6.3 5.8  NEUTROABS  --   --   --   --  4.5  --   HGB 8.8*   < > 7.2* 7.6* 7.8* 7.8*  HCT 25.9*   < > 21.0* 22.3* 23.7* 23.0*  MCV 92.8  --  93.3 92.1 94.0 92.4  PLT 187  --  157 188 210 217   < > = values in this interval not displayed.   Blood Culture    Component Value Date/Time   SDES BLOOD RIGHT HAND 02/21/2022 1952   SPECREQUEST  02/21/2022 1952    BOTTLES DRAWN AEROBIC AND ANAEROBIC Blood Culture adequate volume   CULT  02/21/2022 1952    NO  GROWTH 3 DAYS Performed at [facility], [place] [address] [place], [place] [address]    REPTSTATUS PENDING 02/21/2022 1952    Cardiac Enzymes: Recent Labs  Lab 02/21/22 1250  CKTOTAL 81   CBG: Recent Labs  Lab 02/22/22 2144 02/23/22 0238 02/23/22 0632 02/23/22 1243 02/24/22 2239  GLUCAP 193* 172* 181* 182* 167*   Iron Studies: No results for input(s): "IRON", "TIBC", "TRANSFERRIN", "FERRITIN" in the last 72 hours. @lablastinr3 @ Studies/Results: DG Chest Port 1 View  Result Date: 02/24/2022 CLINICAL DATA:  SHOB EXAM: PORTABLE CHEST 1 VIEW COMPARISON:  February 21, 2022 FINDINGS: Again seen are sternotomy wires, and mediastinal surgical clips. Heart is borderline. Atheromatous calcifications of the arch of the aorta. There is minor atelectasis at the left lung base. Mild central pulmonary vascular congestion without significant interval change. No pleural effusion or consolidation. The visualized skeletal structures are unremarkable. IMPRESSION: Minor atelectasis at the left lung base. Mild central pulmonary vascular congestion, stable. Electronically Signed   By: Hyman Hopes  Amaresh M.D.   On: 02/24/2022 12:09   Medications:   apixaban  5 mg Oral BID   arformoterol  15 mcg Nebulization BID   And   umeclidinium bromide  1 puff Inhalation Daily   atorvastatin  80 mg Oral Daily   calcium acetate  667 mg Oral TID WC   carvedilol  6.25 mg Oral BID WC   Chlorhexidine Gluconate Cloth  6 each Topical Q0600   ezetimibe  10 mg Oral Daily   hydrALAZINE  100 mg Oral TID   insulin aspart  0-5 Units Subcutaneous QHS   insulin aspart  0-9 Units Subcutaneous TID WC   isosorbide mononitrate  90 mg Oral Daily   pantoprazole  40 mg Oral BID   sertraline  50 mg Oral Daily    Outpatient Dialysis Orders: [facility]  MWF  3h 2min  400/500   58kg  2/2 bath  Hep none   L AVF Aranesp 160 mcg weekly (dose recently raised) - last 02/18/22 (178mcg) Hectorol 24mcg IV qHD Sensipar 90mg  with  HD  Assessment/Plan: 1. Acute respiratory failure: suspected to be due to volume overload. Respirations are currently unlabored, undergoing HD for volume management. 2. Acute metabolic encephalopathy: management per primary team, suspected to due to hypoxia  2. ESRD: On MWF schedule, undergoing HD today. If still hypoxic post HD, will plan for serial HD tomorrow 3. HTN/volume:  See #1 above. Bp is currently stable.  4. Anemia: hgb 7.8. He is due for ESA, will order with next HD 5. Secondary hyperparathyroidism:  calcium and phos controlled. Continue calcium acetate. Will resume hectorol.  6. T2DM:  poorly controlled, may be causing increased PO fluid intake. Management per primary team  Anice Paganini, PA-C 02/25/2022, 10:24 AM  [facility] Pager: [phone]

## 2022-02-25 NOTE — Progress Notes (Signed)
PROGRESS NOTE    KAMRON VANWYHE  HER:[id number] DOB: [birth year] DOA: 02/24/2022 PCP: Shelda Pal, DO   Brief Narrative:  Kenneth [name] is a 66 y.o. male with medical history significant for ESRD Monday Wednesday Friday, CAD status post CABG with drug-eluting stent, heart failure with preserved ejection fraction, paroxysmal A-fib on Eliquis, COPD on room air at baseline, insulin-dependent diabetes type 2, essential hypertension, hyperlipidemia, chronic anemia of chronic disease, seizure disorder and history of depression.   Patient presents to the ED after being discharged just 24 hours ago for episodes of what appear to be altered mental status found to be hypoxic by EMS requiring upwards of 2 L nasal cannula and intake.  Patient's recent hospitalization earlier this week was for similar episode of hypoxia with negative x-ray CTA at the time was negative for PE with trace pleural fluid and bilateral groundglass opacities.  Patient was treated at that time for pneumonia and discharged on appropriate antibiotics.  Patient's return now for similar symptoms and does not meet sepsis criteria, concern for volume overload at this point.  Nephrology has been consulted and hospitalist called for admission.   Assessment & Plan:   Principal Problem:   Acute respiratory failure with hypoxia (HCC)  Acute metabolic encephalopathy Rule out breakthrough partial seizure given previous neurology workup -Home med rec does not appear to contain any antiepileptic medications -Family has changed PCP multiple times and it appears medications were not continued during transitions (family denies history of seizure despite multiple evaluations here and an outpatient neurology referral last year alone) for likely breakthrough seizures -Discussed with neurology - will continue Keppra per previous dosing 500mg  HS with 250mg  post dialysis - follow levels outpatient as appropriate -Previous pna could be aspiration  events around seizures.  Acute hypoxic respiratory failure, multifactorial Concurrent volume overload in the setting of ESRD on dialysis as well as preserved ejection fraction heart failure COPD history, rule out acute exacerbation Pneumonia unlikely -Patient appears moderately volume overloaded, imaging shows likely edematous lung volumes, rales on exam -Unfortunately given ESRD status cannot tolerate diuretics, nephrology consulted to assist with volume management - MWF -Unclear if patient has been noncompliant at home with volume intake -COPD exacerbation less likely given no wheeze - only rales -hold off on steroids/antibiotics -Continue home inhalers including umeclidinium -Patient admitted earlier this week with presumed pneumonia, treated appropriately at that time with IV/PO antibiotics - we will follow clinically as patient does not meet SIRS or sepsis criteria at this time.    A-fib, paroxysmal on Eliquis CAD status post CABG/DES Hypertension, essential Hyperlipidemia -Continue home Eliquis, heart rate currently well controlled on  carvedilol -Continue core measures including home statin, Zetia, isosorbide   IDDM2 -uncontrolled with hyperglycemia - A1C 9.0 - Continue sliding scale insulin, hypoglycemic protocol   Chronic anemia of chronic disease -Continue to follow, holding outpatient Mircera(EPO) -can resume at discharge/with dialysis per nephrology   DVT prophylaxis: Eliquis Code Status: Full Family Communication: Discussed with wife and sister-in-law at length over the phone about patient's symptoms, treatment plan, and diagnosis  Status is: Inpt  Dispo: The patient is from: Home              Anticipated d/c is to: Home              Anticipated d/c date is: 24-48h              Patient currently NOT medically stable for discharge  Consultants:  Nephrology  Procedures:  None  Antimicrobials:  None   Subjective: No acute issues/events overnight - symptoms  resolved prior to dialysis per patient.   Objective: Vitals:   02/25/22 0300 02/25/22 0445 02/25/22 0530 02/25/22 0740  BP: (!) 142/58 (!) 157/44 (!) 140/55 (!) 140/46  Pulse: 66 64 62 70  Resp: (!) 22 18 (!) 22 19  Temp:    99 F (37.2 C)  TempSrc:    Oral  SpO2: 94% 95% 93% 95%  Weight:    60.7 kg   No intake or output data in the 24 hours ending 02/25/22 0800 Filed Weights   02/25/22 0740  Weight: 60.7 kg    Examination:  General exam: Appears calm and comfortable  Respiratory system: Clear to auscultation. Respiratory effort normal. Cardiovascular system: S1 & S2 heard, RRR. No JVD, murmurs, rubs, gallops or clicks. No pedal edema. Gastrointestinal system: Abdomen is nondistended, soft and nontender. No organomegaly or masses felt. Normal bowel sounds heard. Central nervous system: Alert and oriented. No focal neurological deficits. Extremities: Symmetric 5 x 5 power. Skin: No rashes, lesions or ulcers Psychiatry: Judgement and insight appear normal. Mood & affect appropriate.   Data Reviewed: I have personally reviewed following labs and imaging studies  CBC: Recent Labs  Lab 02/21/22 1250 02/21/22 1300 02/22/22 0344 02/23/22 0231 02/24/22 1243 02/25/22 0414  WBC 6.7  --  5.5 6.1 6.3 5.8  NEUTROABS  --   --   --   --  4.5  --   HGB 8.8* 8.8* 7.2* 7.6* 7.8* 7.8*  HCT 25.9* 26.0* 21.0* 22.3* 23.7* 23.0*  MCV 92.8  --  93.3 92.1 94.0 92.4  PLT 187  --  157 188 210 956   Basic Metabolic Panel: Recent Labs  Lab 02/21/22 1250 02/21/22 1300 02/22/22 0344 02/23/22 0231 02/24/22 1243 02/25/22 0414  NA 132* 131* 131* 125* 133* 132*  K 3.2* 3.3* 3.4* 3.4* 3.9 3.6  CL 91*  --  91* 89* 94* 98  CO2 31  --  28 25 26 27   GLUCOSE 252*  --  196* 159* 217* 134*  BUN 23  --  26* 35* 26* 29*  CREATININE 5.73*  --  6.89* 8.68* 7.23* 8.63*  CALCIUM 8.3*  --  7.9* 8.1* 8.8* 8.4*  PHOS  --   --   --  2.5  --  2.9   GFR: Estimated Creatinine Clearance: 6.4 mL/min (A)  (by C-G formula based on SCr of 8.63 mg/dL (H)). Liver Function Tests: Recent Labs  Lab 02/21/22 1250 02/23/22 0231  AST 23  --   ALT 13  --   ALKPHOS 84  --   BILITOT 1.1  --   PROT 7.3  --   ALBUMIN 3.1* 2.3*   Recent Labs  Lab 02/21/22 1250  LIPASE 32   Recent Labs  Lab 02/21/22 1404  AMMONIA 14   Coagulation Profile: No results for input(s): "INR", "PROTIME" in the last 168 hours. Cardiac Enzymes: Recent Labs  Lab 02/21/22 1250  CKTOTAL 81   BNP (last 3 results) No results for input(s): "PROBNP" in the last 8760 hours. HbA1C: No results for input(s): "HGBA1C" in the last 72 hours. CBG: Recent Labs  Lab 02/22/22 2144 02/23/22 0238 02/23/22 0632 02/23/22 1243 02/24/22 2239  GLUCAP 193* 172* 181* 182* 167*   Lipid Profile: No results for input(s): "CHOL", "HDL", "LDLCALC", "TRIG", "CHOLHDL", "LDLDIRECT" in the last 72 hours. Thyroid Function Tests: No results for input(s): "TSH", "T4TOTAL", "FREET4", "T3FREE", "THYROIDAB" in the  last 72 hours. Anemia Panel: No results for input(s): "VITAMINB12", "FOLATE", "FERRITIN", "TIBC", "IRON", "RETICCTPCT" in the last 72 hours. Sepsis Labs: Recent Labs  Lab 02/21/22 1250 02/22/22 0344  PROCALCITON  --  0.26  LATICACIDVEN 1.2  --     Recent Results (from the past 240 hour(s))  Blood culture (routine x 2)     Status: None (Preliminary result)   Collection Time: 02/21/22 12:50 PM   Specimen: BLOOD RIGHT ARM  Result Value Ref Range Status   Specimen Description   Final    BLOOD RIGHT ARM BLOOD Performed at [facility], [address] [place], [place] [address]    Special Requests   Final    Blood Culture adequate volume BOTTLES DRAWN AEROBIC AND ANAEROBIC Performed at [facility], [address] [place], [place] [address]    Culture   Final    NO GROWTH 3 DAYS Performed at [facility], [place] [address] [place], [place] [address]    Report Status PENDING  Incomplete  SARS  Coronavirus 2 by RT PCR (hospital order, performed in [facility] hospital lab) *cepheid single result test* Anterior Nasal Swab     Status: None   Collection Time: 02/21/22  1:00 PM   Specimen: Anterior Nasal Swab  Result Value Ref Range Status   SARS Coronavirus 2 by RT PCR NEGATIVE NEGATIVE Final    Comment: (NOTE) SARS-CoV-2 target nucleic acids are NOT DETECTED.  The SARS-CoV-2 RNA is generally detectable in upper and lower respiratory specimens during the acute phase of infection. The lowest concentration of SARS-CoV-2 viral copies this assay can detect is 250 copies / mL. A negative result does not preclude SARS-CoV-2 infection and should not be used as the sole basis for treatment or other patient management decisions.  A negative result may occur with improper specimen collection / handling, submission of specimen other than nasopharyngeal swab, presence of viral mutation(s) within the areas targeted by this assay, and inadequate number of viral copies (<250 copies / mL). A negative result must be combined with clinical observations, patient history, and epidemiological information.  Fact Sheet for Patients:   https://www.patel.info/  Fact Sheet for Healthcare Providers: https://hall.com/  This test is not yet approved or  cleared by the [place] FDA and has been authorized for detection and/or diagnosis of SARS-CoV-2 by FDA under an Emergency Use Authorization (EUA).  This EUA will remain in effect (meaning this test can be used) for the duration of the COVID-19 declaration under Section 564(b)(1) of the Act, 21 U.S.C. section 360bbb-3(b)(1), unless the authorization is terminated or revoked sooner.  Performed at [facility], [address] [place], [place] [address]   Culture, blood (Routine X 2) w Reflex to ID Panel     Status: None (Preliminary result)   Collection Time: 02/21/22  7:52 PM   Specimen: BLOOD  RIGHT HAND  Result Value Ref Range Status   Specimen Description BLOOD RIGHT HAND  Final   Special Requests   Final    BOTTLES DRAWN AEROBIC AND ANAEROBIC Blood Culture adequate volume   Culture   Final    NO GROWTH 2 DAYS Performed at [facility], [place] [address] [place], [place] [address]    Report Status PENDING  Incomplete  MRSA Next Gen by PCR, Nasal     Status: Abnormal   Collection Time: 02/22/22 12:32 PM   Specimen: Anterior Nasal Swab  Result Value  Ref Range Status   MRSA by PCR Next Gen DETECTED (A) NOT DETECTED Final    Comment: RESULT CALLED TO, READ BACK BY AND VERIFIED WITH: M FUTRELL,RN@2047  02/22/22 [place] (NOTE) The GeneXpert MRSA Assay (FDA approved for NASAL specimens only), is one component of a comprehensive MRSA colonization surveillance program. It is not intended to diagnose MRSA infection nor to guide or monitor treatment for MRSA infections. Test performance is not FDA approved in patients less than 3 years old. Performed at [facility], [place] [address] [place], [place] [address]   SARS Coronavirus 2 by RT PCR (hospital order, performed in [facility] hospital lab) *cepheid single result test* Anterior Nasal Swab     Status: None   Collection Time: 02/22/22 12:32 PM   Specimen: Anterior Nasal Swab  Result Value Ref Range Status   SARS Coronavirus 2 by RT PCR NEGATIVE NEGATIVE Final    Comment: (NOTE) SARS-CoV-2 target nucleic acids are NOT DETECTED.  The SARS-CoV-2 RNA is generally detectable in upper and lower respiratory specimens during the acute phase of infection. The lowest concentration of SARS-CoV-2 viral copies this assay can detect is 250 copies / mL. A negative result does not preclude SARS-CoV-2 infection and should not be used as the sole basis for treatment or other patient management decisions.  A negative result may occur with improper specimen collection / handling, submission of specimen other than nasopharyngeal swab,  presence of viral mutation(s) within the areas targeted by this assay, and inadequate number of viral copies (<250 copies / mL). A negative result must be combined with clinical observations, patient history, and epidemiological information.  Fact Sheet for Patients:   https://www.patel.info/  Fact Sheet for Healthcare Providers: https://hall.com/  This test is not yet approved or  cleared by the [place] FDA and has been authorized for detection and/or diagnosis of SARS-CoV-2 by FDA under an Emergency Use Authorization (EUA).  This EUA will remain in effect (meaning this test can be used) for the duration of the COVID-19 declaration under Section 564(b)(1) of the Act, 21 U.S.C. section 360bbb-3(b)(1), unless the authorization is terminated or revoked sooner.  Performed at [facility], [place] [address], [place], [place] [address]          Radiology Studies: DG Chest Port 1 View  Result Date: 02/24/2022 CLINICAL DATA:  SHOB EXAM: PORTABLE CHEST 1 VIEW COMPARISON:  February 21, 2022 FINDINGS: Again seen are sternotomy wires, and mediastinal surgical clips. Heart is borderline. Atheromatous calcifications of the arch of the aorta. There is minor atelectasis at the left lung base. Mild central pulmonary vascular congestion without significant interval change. No pleural effusion or consolidation. The visualized skeletal structures are unremarkable. IMPRESSION: Minor atelectasis at the left lung base. Mild central pulmonary vascular congestion, stable. Electronically Signed   By: Frazier Richards M.D.   On: 02/24/2022 12:09        Scheduled Meds:  apixaban  5 mg Oral BID   arformoterol  15 mcg Nebulization BID   And   umeclidinium bromide  1 puff Inhalation Daily   atorvastatin  80 mg Oral Daily   calcium acetate  667 mg Oral TID WC   carvedilol  6.25 mg Oral BID WC   Chlorhexidine Gluconate Cloth  6 each Topical Q0600   ezetimibe   10 mg Oral Daily   hydrALAZINE  100 mg Oral TID   insulin aspart  0-5 Units Subcutaneous QHS   insulin aspart  0-9 Units Subcutaneous  TID WC   isosorbide mononitrate  90 mg Oral Daily   pantoprazole  40 mg Oral BID   sertraline  50 mg Oral Daily    LOS: 1 day   Time spent: 7min  Cynitha Berte C Lyrique Hakim, DO [facility]  If 7PM-7AM, please contact night-coverage [email or web address]  02/25/2022, 8:00 AM

## 2022-02-25 NOTE — ED Notes (Signed)
Patient was given 2 cups of ice water.

## 2022-02-25 NOTE — ED Notes (Signed)
Patient was transported to Dialysis.

## 2022-02-25 NOTE — Progress Notes (Signed)
Pt receives out-pt HD at [facility] on MWF. Will assist as needed.   Jerline Linzy Renal Navigator [phone] 

## 2022-02-26 ENCOUNTER — Telehealth: Payer: Self-pay | Admitting: Cardiology

## 2022-02-26 ENCOUNTER — Encounter: Payer: Self-pay | Admitting: Student

## 2022-02-26 ENCOUNTER — Telehealth: Payer: Self-pay | Admitting: Family Medicine

## 2022-02-26 ENCOUNTER — Other Ambulatory Visit (HOSPITAL_BASED_OUTPATIENT_CLINIC_OR_DEPARTMENT_OTHER): Payer: Self-pay

## 2022-02-26 ENCOUNTER — Encounter (HOSPITAL_COMMUNITY): Payer: Medicare Other

## 2022-02-26 DIAGNOSIS — G40109 Localization-related (focal) (partial) symptomatic epilepsy and epileptic syndromes with simple partial seizures, not intractable, without status epilepticus: Secondary | ICD-10-CM

## 2022-02-26 LAB — CBC
HCT: 24.2 % — ABNORMAL LOW (ref 39.0–52.0)
Hemoglobin: 8.3 g/dL — ABNORMAL LOW (ref 13.0–17.0)
MCH: 31.3 pg (ref 26.0–34.0)
MCHC: 34.3 g/dL (ref 30.0–36.0)
MCV: 91.3 fL (ref 80.0–100.0)
Platelets: 249 10*3/uL (ref 150–400)
RBC: 2.65 MIL/uL — ABNORMAL LOW (ref 4.22–5.81)
RDW: 14.4 % (ref 11.5–15.5)
WBC: 5.7 10*3/uL (ref 4.0–10.5)
nRBC: 0 % (ref 0.0–0.2)

## 2022-02-26 LAB — GLUCOSE, CAPILLARY
Glucose-Capillary: 69 mg/dL — ABNORMAL LOW (ref 70–99)
Glucose-Capillary: 94 mg/dL (ref 70–99)

## 2022-02-26 LAB — CULTURE, BLOOD (ROUTINE X 2)
Culture: NO GROWTH
Culture: NO GROWTH
Special Requests: ADEQUATE
Special Requests: ADEQUATE

## 2022-02-26 LAB — BASIC METABOLIC PANEL
Anion gap: 11 (ref 5–15)
BUN: 15 mg/dL (ref 8–23)
CO2: 29 mmol/L (ref 22–32)
Calcium: 8.9 mg/dL (ref 8.9–10.3)
Chloride: 93 mmol/L — ABNORMAL LOW (ref 98–111)
Creatinine, Ser: 5.53 mg/dL — ABNORMAL HIGH (ref 0.61–1.24)
GFR, Estimated: 11 mL/min — ABNORMAL LOW (ref >60)
Glucose, Bld: 99 mg/dL (ref 70–99)
Potassium: 3.7 mmol/L (ref 3.5–5.1)
Sodium: 133 mmol/L — ABNORMAL LOW (ref 135–145)

## 2022-02-26 MED ORDER — LEVETIRACETAM 500 MG PO TABS
500.0000 mg | ORAL_TABLET | Freq: Every day | ORAL | 0 refills | Status: DC
  Filled 2022-02-26: qty 30, 30d supply, fill #0

## 2022-02-26 MED ORDER — LEVETIRACETAM 250 MG PO TABS
250.0000 mg | ORAL_TABLET | ORAL | 0 refills | Status: DC
  Filled 2022-02-26: qty 12, 28d supply, fill #0

## 2022-02-26 NOTE — Telephone Encounter (Signed)
Patient's spouse called in cancelling the patient's carotid study due to him being hospitalized. Order expires prior to being able to reschedule. Please advise.

## 2022-02-26 NOTE — Progress Notes (Signed)
[facility] Progress Note   Subjective:   Had 4L UF with HD yesterday and reports his breathing is at baseline. Not currently on O2. Denies SOB, CP, dizziness, abdominal pain and nausea.   Objective Vitals:   02/25/22 2040 02/26/22 0123 02/26/22 0500 02/26/22 0506  BP: (!) 127/53 (!) 121/50  (!) 131/44  Pulse: 65 (!) 57  (!) 57  Resp:  19  18  Temp: 98.7 F (37.1 C) 98.5 F (36.9 C)  98.7 F (37.1 C)  TempSrc: Oral Oral  Oral  SpO2: 95% 95%  97%  Weight:   59.6 kg   Height:       Physical Exam General: WDWN male, alert and in NAD Heart: RRR, no murmurs, rubs or gallops Lungs: CTA bilaterally without wheezing, rhonchi or rales Abdomen: Soft, non-distended, +BS Extremities: No edema b/l lower extremities Dialysis Access:  LUE AVF + t/b  Additional Objective Labs: Basic Metabolic Panel: Recent Labs  Lab 02/23/22 0231 02/24/22 1243 02/25/22 0414 02/26/22 0208  NA 125* 133* 132* 133*  K 3.4* 3.9 3.6 3.7  CL 89* 94* 98 93*  CO2 25 26 27 29   GLUCOSE 159* 217* 134* 99  BUN 35* 26* 29* 15  CREATININE 8.68* 7.23* 8.63* 5.53*  CALCIUM 8.1* 8.8* 8.4* 8.9  PHOS 2.5  --  2.9  --    Liver Function Tests: Recent Labs  Lab 02/21/22 1250 02/23/22 0231  AST 23  --   ALT 13  --   ALKPHOS 84  --   BILITOT 1.1  --   PROT 7.3  --   ALBUMIN 3.1* 2.3*   Recent Labs  Lab 02/21/22 1250  LIPASE 32   CBC: Recent Labs  Lab 02/22/22 0344 02/23/22 0231 02/24/22 1243 02/25/22 0414 02/26/22 0208  WBC 5.5 6.1 6.3 5.8 5.7  NEUTROABS  --   --  4.5  --   --   HGB 7.2* 7.6* 7.8* 7.8* 8.3*  HCT 21.0* 22.3* 23.7* 23.0* 24.2*  MCV 93.3 92.1 94.0 92.4 91.3  PLT 157 188 210 217 249   Blood Culture    Component Value Date/Time   SDES BLOOD RIGHT HAND 02/21/2022 1952   SPECREQUEST  02/21/2022 1952    BOTTLES DRAWN AEROBIC AND ANAEROBIC Blood Culture adequate volume   CULT  02/21/2022 1952    NO GROWTH 4 DAYS Performed at [facility], [address] [place], [place] [address]    REPTSTATUS PENDING 02/21/2022 1952    Cardiac Enzymes: Recent Labs  Lab 02/21/22 1250  CKTOTAL 81   CBG: Recent Labs  Lab 02/25/22 1210 02/25/22 1620 02/25/22 2042 02/26/22 0718 02/26/22 0733  GLUCAP 94 213* 131* 69* 94   Iron Studies: No results for input(s): "IRON", "TIBC", "TRANSFERRIN", "FERRITIN" in the last 72 hours. @lablastinr3 @ Studies/Results: DG Chest Port 1 View  Result Date: 02/24/2022 CLINICAL DATA:  SHOB EXAM: PORTABLE CHEST 1 VIEW COMPARISON:  February 21, 2022 FINDINGS: Again seen are sternotomy wires, and mediastinal surgical clips. Heart is borderline. Atheromatous calcifications of the arch of the aorta. There is minor atelectasis at the left lung base. Mild central pulmonary vascular congestion without significant interval change. No pleural effusion or consolidation. The visualized skeletal structures are unremarkable. IMPRESSION: Minor atelectasis at the left lung base. Mild central pulmonary vascular congestion, stable. Electronically Signed   By: Frazier Richards M.D.   On: 02/24/2022 12:09   Medications:   apixaban  5 mg Oral BID   arformoterol  15 mcg Nebulization BID   And   umeclidinium bromide  1 puff Inhalation Daily   atorvastatin  80 mg Oral Daily   calcium acetate  667 mg Oral TID WC   carvedilol  6.25 mg Oral BID WC   Chlorhexidine Gluconate Cloth  6 each Topical Q0600   [START ON 02/27/2022] darbepoetin (ARANESP) injection - DIALYSIS  150 mcg Intravenous Q Fri-HD   doxercalciferol  2 mcg Intravenous Q M,W,F-HD   ezetimibe  10 mg Oral Daily   hydrALAZINE  100 mg Oral TID   insulin aspart  0-5 Units Subcutaneous QHS   insulin aspart  0-9 Units Subcutaneous TID WC   isosorbide mononitrate  90 mg Oral Daily   levETIRAcetam  250 mg Oral Q M,W,F-2000   levETIRAcetam  500 mg Oral Q2000   pantoprazole  40 mg Oral BID   sertraline  50 mg Oral Daily    Dialysis Orders: [facility]  MWF  3h 4min  400/500   58kg  2/2  [place]  Hep none   L AVF Aranesp 160 mcg weekly (dose recently raised) - last 02/18/22 (19mcg) Hectorol 70mcg IV qHD Sensipar 90mg  with HD  Assessment/Plan: 1. Acute respiratory failure: suspected to be due to volume overload. Net UF 4L yesterday and SOB is resolved. Continue fluid restrictions and UF with HD as tolerated.  2. Acute metabolic encephalopathy: management per primary team, suspected to due to hypoxia and has hx of seizures. Appears to be at baseline mental status.  2. ESRD: Continue MWF schedule 3. HTN/volume:  See #1 above. Bp is currently stable.  4. Anemia: hgb 8.3.  He is due for ESA, will order with next HD 5. Secondary hyperparathyroidism:  calcium and phos controlled. Continue calcium acetate. Will resume hectorol.  6. T2DM:  poorly controlled, may be causing increased PO fluid intake. Management per primary team    Anice Paganini, PA-C 02/26/2022, 9:36 AM  [facility] Pager: [phone]

## 2022-02-26 NOTE — TOC Transition Note (Signed)
Transition of Care [facility]) - CM/SW Discharge Note   Patient Details  Name: Kenneth [name] MRN: [id number] Date of Birth: 12/28/1955  [facility] [place] [facility] CM/SW Contact:  Tom-Johnson, Renea Ee, RN Phone Number: 02/26/2022, 10:56 AM   Clinical Narrative:     Patient is scheduled for discharge today. No TOC needs or recommendations noted. Wife to transport at discharge. No further TOC needs noted.   Final next level of care: Home/Self Care Barriers to Discharge: Barriers Resolved   Patient Goals and CMS Choice Patient states their goals for this hospitalization and ongoing recovery are:: To return home CMS Medicare.gov Compare Post Acute Care list provided to:: Patient Choice offered to / list presented to : NA  Discharge Placement                Patient to be transferred to facility by: Wife      Discharge Plan and Services                DME Arranged: N/A DME Agency: NA       HH Arranged: NA HH Agency: NA        Social Determinants of Health (SDOH) Interventions     Readmission Risk Interventions    02/23/2022   12:28 PM 01/04/2021   12:48 PM  Readmission Risk Prevention Plan  Transportation Screening Complete Complete  Medication Review (RN Care Manager) Complete Complete  PCP or Specialist appointment within 3-5 days of discharge Complete   HRI or [place] Complete Complete  SW Recovery Care/Counseling Consult Complete Complete  Palliative Care Screening Not Applicable Not [employer] Not Applicable Not Applicable

## 2022-02-26 NOTE — Telephone Encounter (Signed)
Error

## 2022-02-26 NOTE — Discharge Summary (Signed)
Physician Discharge Summary  [name]:[id number] DOB: 10-06-1955 DOA: 02/24/2022  PCP: Shelda Pal, DO  Admit date: 02/24/2022 Discharge date: 02/26/2022  Admitted From: Home Disposition: Home  Recommendations for Outpatient Follow-up:  Follow up with PCP in 1-2 weeks Follow up with neurology as scheduled Continue dialysis and follow-up with nephrology as scheduled  Home Health: None Equipment/Devices: None  Discharge Condition: Stable CODE STATUS: Full Diet recommendation: Renal diet  Brief/Interim Summary: Kenneth [name] is a 66 y.o. male with medical history significant for ESRD Monday Wednesday Friday, CAD status post CABG with drug-eluting stent, heart failure with preserved ejection fraction, paroxysmal A-fib on Eliquis, COPD on room air at baseline, insulin-dependent diabetes type 2, essential hypertension, hyperlipidemia, chronic anemia of chronic disease, seizure disorder and history of depression.   Patient presents to the ED after being discharged just 24 hours ago for episodes of what appear to be altered mental status found to be hypoxic by EMS requiring upwards of 2 L nasal cannula and intake as well as transient episode of chest pain and shortness of breath.  Patient's history is somewhat difficult to obtain given multiple family members requiring translator and some difficulty in obtaining a consistent story.  Regardless patient's work-up here has been wholly unremarkable.  He has completed dialysis with increased volume removal he has had no further symptoms of chest pain shortness of breath hypoxia or altered mental status.  Further chart review reveals patient was previously diagnosed with seizure but has not been taking his Keppra.  Neither the patient nor family are able to corroborate his diagnosis or reasoning for discontinuation of medications despite documentation last fall for partial seizures from our neurology team here in house, he has not followed  up outpatient with neurology as scheduled.  Complicating patient's history is multiple physician changes in the outpatient setting, with lack of continuity it is not surprising patient's medications have been missed.  We discussed the need for a single primary care physician, nephrologist and neurologist to follow otherwise he risks having multiple medications missed and worsening conditions as above.  At this time patient is otherwise stable and agreeable for discharge home, denies nausea vomiting diarrhea constipation headache fevers chills chest pain or further mental status changes per staff reporting.  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[facility]) Active Problems:   Acute respiratory failure with hypoxia (HCC)   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 (HCC)   Chronic diastolic CHF (congestive heart failure) (HCC)   Anemia, chronic renal failure   Fluid overload   Altered mental status   Acute metabolic encephalopathy   Anemia of chronic disease  Acute metabolic encephalopathy, resolved Rule out breakthrough partial seizure given previous neurology workup -Continue Keppra per previous dosing 500mg  HS with 250mg  post dialysis - follow levels outpatient as appropriate  Acute hypoxic respiratory failure, multifactorial Concurrent volume overload in the setting of ESRD on dialysis as well as preserved ejection fraction heart failure COPD history, rule out acute exacerbation Pneumonia unlikely -Improved with diuresis, otherwise without respiratory symptoms at this point or hypoxia.    A-fib, paroxysmal on Eliquis CAD status post CABG/DES Hypertension, essential Hyperlipidemia -Continue home Eliquis, heart rate currently well controlled on  carvedilol -Continue core measures including home statin, Zetia, isosorbide   IDDM2 -uncontrolled with hyperglycemia - A1C 9.0 -Recommend close outpatient endocrinology follow-up at this point given profoundly uncontrolled diabetes  despite insulin pump   Chronic anemia of chronic disease -Continue to follow, holding outpatient Mircera(EPO) -can  resume at discharge/with dialysis per nephrology  Discharge Instructions  Discharge Instructions     Discharge patient   Complete by: As directed    Discharge disposition: 01-Home or Self Care   Discharge patient date: 02/26/2022      Allergies as of 02/26/2022   No Known Allergies      Medication List     STOP taking these medications    amoxicillin-clavulanate 500-125 MG tablet Commonly known as: Augmentin   doxycycline 100 MG tablet Commonly known as: VIBRA-TABS   insulin pump Soln       TAKE these medications    atorvastatin 80 MG tablet Commonly known as: LIPITOR Take 1 tablet (80 mg total) by mouth daily.   calcium acetate 667 MG capsule Commonly known as: PHOSLO Take 667 mg by mouth 3 (three) times daily with meals.   carvedilol 6.25 MG tablet Commonly known as: COREG Take 1 tablet (6.25 mg total) by mouth 2 (two) times daily with a meal. Hold on the mornings of dialysis.   diclofenac Sodium 1 % Gel Commonly known as: Voltaren Apply 4 grams topically 4 (four) times daily.   Eliquis 5 MG Tabs tablet Generic drug: apixaban Take 1 tablet by mouth 2 times daily   ezetimibe 10 MG tablet Commonly known as: ZETIA Take 1 tablet by mouth daily   FreeStyle Libre 2 Sensor Misc 1 Device by Does not apply route every 14 (fourteen) days.   hydrALAZINE 100 MG tablet Commonly known as: APRESOLINE Take 1 tablet (100 mg total) by mouth 3 (three) times daily. DO NOT TAKE AM AND NOON DOSES ON HD DAYS   isosorbide mononitrate 60 MG 24 hr tablet Commonly known as: IMDUR Take 1&1/2 tablets (90 mg total) by mouth daily. Hold on days of dialysis on Mon, Wed, Fri.   levETIRAcetam 500 MG tablet Commonly known as: KEPPRA Take 1 tablet (500 mg total) by mouth at bedtime.   levETIRAcetam 250 MG tablet Commonly known as: KEPPRA Take 1 tablet (250 mg  total) by mouth every Monday, Wednesday, and Friday at 8 PM. Start taking on: February 27, 2022   LOKELMA PO Take 1 packet by mouth 4 (four) times a week. Hold on dialysis days   Lyumjev 100 UNIT/ML Soln Generic drug: Insulin Lispro-aabc Max Daily 30 units per insulin pump   MIRCERA IJ Mircera   OneTouch Verio test strip Generic drug: glucose blood 1 each by Other route in the morning, at noon, in the evening, and at bedtime. Use as instructed   pantoprazole 40 MG tablet Commonly known as: PROTONIX Take 1 tablet (40 mg total) by mouth 2 (two) times daily.   sertraline 50 MG tablet Commonly known as: Zoloft Take 1 tablet (50 mg total) by mouth daily.   Stiolto Respimat 2.5-2.5 MCG/ACT Aers Generic drug: Tiotropium Bromide-Olodaterol Inhale 2 puffs by mouth into the lungs daily.   TechLite Pen Needles 32G X 6 MM Misc Generic drug: Insulin Pen Needle Use as directed 3 times daily        No Known Allergies  Consultations: Nephrology  Procedures/Studies: DG Chest Port 1 View  Result Date: 02/24/2022 CLINICAL DATA:  SHOB EXAM: PORTABLE CHEST 1 VIEW COMPARISON:  February 21, 2022 FINDINGS: Again seen are sternotomy wires, and mediastinal surgical clips. Heart is borderline. Atheromatous calcifications of the arch of the aorta. There is minor atelectasis at the left lung base. Mild central pulmonary vascular congestion without significant interval change. No pleural effusion or consolidation. The visualized skeletal structures  are unremarkable. IMPRESSION: Minor atelectasis at the left lung base. Mild central pulmonary vascular congestion, stable. Electronically Signed   By: Frazier Richards M.D.   On: 02/24/2022 12:09   CT ABDOMEN PELVIS W CONTRAST  Result Date: 02/21/2022 CLINICAL DATA:  Abdominal pain. EXAM: CT ABDOMEN AND PELVIS WITH CONTRAST TECHNIQUE: Multidetector CT imaging of the abdomen and pelvis was performed using the standard protocol following bolus administration of  intravenous contrast. RADIATION DOSE REDUCTION: This exam was performed according to the departmental dose-optimization program which includes automated exposure control, adjustment of the mA and/or kV according to patient size and/or use of iterative reconstruction technique. CONTRAST:  140mL OMNIPAQUE IOHEXOL 350 MG/ML SOLN COMPARISON:  CT [id number] FINDINGS: Lower chest: Dependent ground-glass opacity in both lungs is likely related to atelectasis although multifocal pneumonia not excluded. Small right and tiny left pleural effusions evident. Hepatobiliary: No suspicious focal abnormality within the liver parenchyma. There is no evidence for gallstones, gallbladder wall thickening, or pericholecystic fluid. No intrahepatic or extrahepatic biliary dilation. Pancreas: No focal mass lesion. No dilatation of the main duct. No intraparenchymal cyst. No peripancreatic edema. Spleen: No splenomegaly. No focal mass lesion. Adrenals/Urinary Tract: No adrenal nodule or mass. Stable small cyst noted upper pole left kidney. No followup recommended. Right kidney unremarkable. No evidence for hydroureter. The urinary bladder appears normal for the degree of distention. Stomach/Bowel: Stomach is nondistended. Duodenum is normally positioned as is the ligament of Treitz. No small bowel wall thickening. No small bowel dilatation. The terminal ileum is normal. The appendix is normal. No gross colonic mass. No colonic wall thickening. Vascular/Lymphatic: There is moderate atherosclerotic calcification of the abdominal aorta without aneurysm. There is no gastrohepatic or hepatoduodenal ligament lymphadenopathy. No retroperitoneal or mesenteric lymphadenopathy. No pelvic sidewall lymphadenopathy. Reproductive: The prostate gland and seminal vesicles are unremarkable. Other: No intraperitoneal free fluid. Musculoskeletal: No worrisome lytic or sclerotic osseous abnormality. IMPRESSION: 1. No acute findings in the abdomen or pelvis. 2.  Dependent ground-glass opacity in both lungs is likely related to atelectasis although multifocal pneumonia not excluded. Small right and tiny left pleural effusions evident. 3. Aortic Atherosclerosis (ICD10-I70.0). Electronically Signed   By: Misty Stanley M.D.   On: 02/21/2022 15:03   CT Angio Chest PE W and/or Wo Contrast  Result Date: 02/21/2022 CLINICAL DATA:  PE suspected, right-sided chest pain for 10 days EXAM: CT ANGIOGRAPHY CHEST WITH CONTRAST TECHNIQUE: Multidetector CT imaging of the chest was performed using the standard protocol during bolus administration of intravenous contrast. Multiplanar CT image reconstructions and MIPs were obtained to evaluate the vascular anatomy. RADIATION DOSE REDUCTION: This exam was performed according to the departmental dose-optimization program which includes automated exposure control, adjustment of the mA and/or kV according to patient size and/or use of iterative reconstruction technique. CONTRAST:  154mL OMNIPAQUE IOHEXOL 350 MG/ML SOLN COMPARISON:  10/21/2021 FINDINGS: Cardiovascular: Satisfactory opacification of the pulmonary arteries to the segmental level. No evidence of pulmonary embolism. Cardiomegaly. Three-vessel coronary artery calcifications status post median sternotomy and CABG. No pericardial effusion. Mediastinum/Nodes: No enlarged mediastinal, hilar, or axillary lymph nodes. Thyroid gland, trachea, and esophagus demonstrate no significant findings. Lungs/Pleura: Small right, trace left pleural effusions. Moderate centrilobular and paraseptal emphysema. Upper Abdomen: No acute abnormality. Please see separately reported examination of the abdomen and pelvis. Musculoskeletal: No chest wall abnormality. No acute osseous findings. Review of the MIP images confirms the above findings. IMPRESSION: 1. Negative examination for pulmonary embolism. 2. Small right, trace left pleural effusions. 3. Moderate emphysema. 4. Coronary  artery disease. Emphysema  (ICD10-J43.9). Electronically Signed   By: Delanna Ahmadi M.D.   On: 02/21/2022 14:57   CT Head Wo Contrast  Result Date: 02/21/2022 CLINICAL DATA:  Syncope. EXAM: CT HEAD WITHOUT CONTRAST TECHNIQUE: Contiguous axial images were obtained from the base of the skull through the vertex without intravenous contrast. RADIATION DOSE REDUCTION: This exam was performed according to the departmental dose-optimization program which includes automated exposure control, adjustment of the mA and/or kV according to patient size and/or use of iterative reconstruction technique. COMPARISON:  12/22/2020 FINDINGS: Brain: No acute intracranial hemorrhage. No focal mass lesion. No CT evidence of acute infarction. No midline shift or mass effect. No hydrocephalus. Basilar cisterns are patent. There are periventricular and subcortical white matter hypodensities. Generalized cortical atrophy. Vascular: No hyperdense vessel or unexpected calcification. Skull: Normal. Negative for fracture or focal lesion. Sinuses/Orbits: Paranasal sinuses and mastoid air cells are clear. Orbits are clear. Other: None. IMPRESSION: 1. No change from comparison exam. 2. Atrophy and subcortical white matter disease. Electronically Signed   By: Suzy Bouchard M.D.   On: 02/21/2022 14:48   DG Chest Port 1 View  Result Date: 02/21/2022 CLINICAL DATA:  Short of breath EXAM: PORTABLE CHEST 1 VIEW COMPARISON:  None Available. FINDINGS: Sternotomy wires overlie normal cardiac silhouette. No effusion, infiltrate or pneumothorax. Normal pulmonary vasculature. Atherosclerotic calcification of the aorta. IMPRESSION: No acute cardiopulmonary process. Electronically Signed   By: Suzy Bouchard M.D.   On: 02/21/2022 13:21     Subjective: No acute issues or events overnight   Discharge Exam: Vitals:   02/26/22 0123 02/26/22 0506  BP: (!) 121/50 (!) 131/44  Pulse: (!) 57 (!) 57  Resp: 19 18  Temp: 98.5 F (36.9 C) 98.7 F (37.1 C)  SpO2: 95% 97%    Vitals:   02/25/22 2040 02/26/22 0123 02/26/22 0500 02/26/22 0506  BP: (!) 127/53 (!) 121/50  (!) 131/44  Pulse: 65 (!) 57  (!) 57  Resp:  19  18  Temp: 98.7 F (37.1 C) 98.5 F (36.9 C)  98.7 F (37.1 C)  TempSrc: Oral Oral  Oral  SpO2: 95% 95%  97%  Weight:   59.6 kg   Height:        General: Pt is alert, awake, not in acute distress Cardiovascular: RRR, S1/S2 +, no rubs, no gallops Respiratory: CTA bilaterally, no wheezing, no rhonchi Abdominal: Soft, NT, ND, bowel sounds + Extremities: no edema, no cyanosis   The results of significant diagnostics from this hospitalization (including imaging, microbiology, ancillary and laboratory) are listed below for reference.     Microbiology: Recent Results (from the past 240 hour(s))  Blood culture (routine x 2)     Status: None   Collection Time: 02/21/22 12:50 PM   Specimen: BLOOD RIGHT ARM  Result Value Ref Range Status   Specimen Description   Final    BLOOD RIGHT ARM BLOOD Performed at [facility], [address] [place], [place] [address]    Special Requests   Final    Blood Culture adequate volume BOTTLES DRAWN AEROBIC AND ANAEROBIC Performed at [facility], [address] [place], [place] [address]    Culture   Final    NO GROWTH 5 DAYS Performed at [facility], [place] [address] [place], [place] [address]    Report Status 02/26/2022 FINAL  Final  SARS Coronavirus 2 by RT PCR (hospital order, performed in [facility] hospital lab) *cepheid  single result test* Anterior Nasal Swab     Status: None   Collection Time: 02/21/22  1:00 PM   Specimen: Anterior Nasal Swab  Result Value Ref Range Status   SARS Coronavirus 2 by RT PCR NEGATIVE NEGATIVE Final    Comment: (NOTE) SARS-CoV-2 target nucleic acids are NOT DETECTED.  The SARS-CoV-2 RNA is generally detectable in upper and lower respiratory specimens during the acute phase of infection. The lowest concentration of SARS-CoV-2  viral copies this assay can detect is 250 copies / mL. A negative result does not preclude SARS-CoV-2 infection and should not be used as the sole basis for treatment or other patient management decisions.  A negative result may occur with improper specimen collection / handling, submission of specimen other than nasopharyngeal swab, presence of viral mutation(s) within the areas targeted by this assay, and inadequate number of viral copies (<250 copies / mL). A negative result must be combined with clinical observations, patient history, and epidemiological information.  Fact Sheet for Patients:   https://www.patel.info/  Fact Sheet for Healthcare Providers: https://hall.com/  This test is not yet approved or  cleared by the [place] FDA and has been authorized for detection and/or diagnosis of SARS-CoV-2 by FDA under an Emergency Use Authorization (EUA).  This EUA will remain in effect (meaning this test can be used) for the duration of the COVID-19 declaration under Section 564(b)(1) of the Act, 21 U.S.C. section 360bbb-3(b)(1), unless the authorization is terminated or revoked sooner.  Performed at [facility], [address] [place], [place] [address]   Culture, blood (Routine X 2) w Reflex to ID Panel     Status: None (Preliminary result)   Collection Time: 02/21/22  7:52 PM   Specimen: BLOOD RIGHT HAND  Result Value Ref Range Status   Specimen Description BLOOD RIGHT HAND  Final   Special Requests   Final    BOTTLES DRAWN AEROBIC AND ANAEROBIC Blood Culture adequate volume   Culture   Final    NO GROWTH 4 DAYS Performed at [facility], [place] [address] [place], [place] [address]    Report Status PENDING  Incomplete  MRSA Next Gen by PCR, Nasal     Status: Abnormal   Collection Time: 02/22/22 12:32 PM   Specimen: Anterior Nasal Swab  Result Value Ref Range Status   MRSA by PCR Next Gen DETECTED (A) NOT  DETECTED Final    Comment: RESULT CALLED TO, READ BACK BY AND VERIFIED WITH: M FUTRELL,RN@2047  02/22/22 [place] (NOTE) The GeneXpert MRSA Assay (FDA approved for NASAL specimens only), is one component of a comprehensive MRSA colonization surveillance program. It is not intended to diagnose MRSA infection nor to guide or monitor treatment for MRSA infections. Test performance is not FDA approved in patients less than 19 years old. Performed at [facility], [place] [address] [place], [place] [address]   SARS Coronavirus 2 by RT PCR (hospital order, performed in [facility] hospital lab) *cepheid single result test* Anterior Nasal Swab     Status: None   Collection Time: 02/22/22 12:32 PM   Specimen: Anterior Nasal Swab  Result Value Ref Range Status   SARS Coronavirus 2 by RT PCR NEGATIVE NEGATIVE Final    Comment: (NOTE) SARS-CoV-2 target nucleic acids are NOT DETECTED.  The SARS-CoV-2 RNA is generally detectable in upper and lower respiratory specimens during the acute phase of infection. The lowest concentration of SARS-CoV-2 viral copies this assay can detect is  250 copies / mL. A negative result does not preclude SARS-CoV-2 infection and should not be used as the sole basis for treatment or other patient management decisions.  A negative result may occur with improper specimen collection / handling, submission of specimen other than nasopharyngeal swab, presence of viral mutation(s) within the areas targeted by this assay, and inadequate number of viral copies (<250 copies / mL). A negative result must be combined with clinical observations, patient history, and epidemiological information.  Fact Sheet for Patients:   https://www.patel.info/  Fact Sheet for Healthcare Providers: https://hall.com/  This test is not yet approved or  cleared by the [place] FDA and has been authorized for detection and/or diagnosis of SARS-CoV-2  by FDA under an Emergency Use Authorization (EUA).  This EUA will remain in effect (meaning this test can be used) for the duration of the COVID-19 declaration under Section 564(b)(1) of the Act, 21 U.S.C. section 360bbb-3(b)(1), unless the authorization is terminated or revoked sooner.  Performed at [facility], [place] [address], [place], [place] [address]      Labs: BNP (last 3 results) Recent Labs    07/28/21 0724 07/28/21 1944 02/24/22 1243  BNP 1,925.2* 2,160.6* 9,833.8*   Basic Metabolic Panel: Recent Labs  Lab 02/22/22 0344 02/23/22 0231 02/24/22 1243 02/25/22 0414 02/26/22 0208  NA 131* 125* 133* 132* 133*  K 3.4* 3.4* 3.9 3.6 3.7  CL 91* 89* 94* 98 93*  CO2 28 25 26 27 29   GLUCOSE 196* 159* 217* 134* 99  BUN 26* 35* 26* 29* 15  CREATININE 6.89* 8.68* 7.23* 8.63* 5.53*  CALCIUM 7.9* 8.1* 8.8* 8.4* 8.9  PHOS  --  2.5  --  2.9  --    Liver Function Tests: Recent Labs  Lab 02/21/22 1250 02/23/22 0231  AST 23  --   ALT 13  --   ALKPHOS 84  --   BILITOT 1.1  --   PROT 7.3  --   ALBUMIN 3.1* 2.3*   Recent Labs  Lab 02/21/22 1250  LIPASE 32   Recent Labs  Lab 02/21/22 1404  AMMONIA 14   CBC: Recent Labs  Lab 02/22/22 0344 02/23/22 0231 02/24/22 1243 02/25/22 0414 02/26/22 0208  WBC 5.5 6.1 6.3 5.8 5.7  NEUTROABS  --   --  4.5  --   --   HGB 7.2* 7.6* 7.8* 7.8* 8.3*  HCT 21.0* 22.3* 23.7* 23.0* 24.2*  MCV 93.3 92.1 94.0 92.4 91.3  PLT 157 188 210 217 249   Cardiac Enzymes: Recent Labs  Lab 02/21/22 1250  CKTOTAL 81   BNP: Invalid input(s): "POCBNP" CBG: Recent Labs  Lab 02/25/22 1210 02/25/22 1620 02/25/22 2042 02/26/22 0718 02/26/22 0733  GLUCAP 94 213* 131* 69* 94   D-Dimer No results for input(s): "DDIMER" in the last 72 hours. Hgb A1c No results for input(s): "HGBA1C" in the last 72 hours. Lipid Profile No results for input(s): "CHOL", "HDL", "LDLCALC", "TRIG", "CHOLHDL", "LDLDIRECT" in the last 72  hours. Thyroid function studies No results for input(s): "TSH", "T4TOTAL", "T3FREE", "THYROIDAB" in the last 72 hours.  Invalid input(s): "FREET3" Anemia work up No results for input(s): "VITAMINB12", "FOLATE", "FERRITIN", "TIBC", "IRON", "RETICCTPCT" in the last 72 hours. Urinalysis    Component Value Date/Time   COLORURINE YELLOW 10/30/2016 [place] 10/30/2016 2339   LABSPEC 1.010 10/30/2016 2339   PHURINE 5.0 10/30/2016 [place] 10/30/2016 2339   HGBUR NEGATIVE 10/30/2016 2339  BILIRUBINUR NEGATIVE 10/30/2016 [place] 10/30/2016 2339   PROTEINUR 100 (A) 10/30/2016 2339   NITRITE NEGATIVE 10/30/2016 2339   LEUKOCYTESUR NEGATIVE 10/30/2016 2339   Sepsis Labs Recent Labs  Lab 02/23/22 0231 02/24/22 1243 02/25/22 0414 02/26/22 0208  WBC 6.1 6.3 5.8 5.7   Microbiology Recent Results (from the past 240 hour(s))  Blood culture (routine x 2)     Status: None   Collection Time: 02/21/22 12:50 PM   Specimen: BLOOD RIGHT ARM  Result Value Ref Range Status   Specimen Description   Final    BLOOD RIGHT ARM BLOOD Performed at [facility], [address] [place], [place] [address]    Special Requests   Final    Blood Culture adequate volume BOTTLES DRAWN AEROBIC AND ANAEROBIC Performed at [facility], [address] [place], [place] [address]    Culture   Final    NO GROWTH 5 DAYS Performed at [facility], [place] [address], [place], [place] [address]    Report Status 02/26/2022 FINAL  Final  SARS Coronavirus 2 by RT PCR (hospital order, performed in [facility] hospital lab) *cepheid single result test* Anterior Nasal Swab     Status: None   Collection Time: 02/21/22  1:00 PM   Specimen: Anterior Nasal Swab  Result Value Ref Range Status   SARS Coronavirus 2 by RT PCR NEGATIVE NEGATIVE Final    Comment: (NOTE) SARS-CoV-2 target nucleic acids are NOT DETECTED.  The SARS-CoV-2 RNA is  generally detectable in upper and lower respiratory specimens during the acute phase of infection. The lowest concentration of SARS-CoV-2 viral copies this assay can detect is 250 copies / mL. A negative result does not preclude SARS-CoV-2 infection and should not be used as the sole basis for treatment or other patient management decisions.  A negative result may occur with improper specimen collection / handling, submission of specimen other than nasopharyngeal swab, presence of viral mutation(s) within the areas targeted by this assay, and inadequate number of viral copies (<250 copies / mL). A negative result must be combined with clinical observations, patient history, and epidemiological information.  Fact Sheet for Patients:   https://www.patel.info/  Fact Sheet for Healthcare Providers: https://hall.com/  This test is not yet approved or  cleared by the [place] FDA and has been authorized for detection and/or diagnosis of SARS-CoV-2 by FDA under an Emergency Use Authorization (EUA).  This EUA will remain in effect (meaning this test can be used) for the duration of the COVID-19 declaration under Section 564(b)(1) of the Act, 21 U.S.C. section 360bbb-3(b)(1), unless the authorization is terminated or revoked sooner.  Performed at [facility], [address] [place], [place] [address]   Culture, blood (Routine X 2) w Reflex to ID Panel     Status: None (Preliminary result)   Collection Time: 02/21/22  7:52 PM   Specimen: BLOOD RIGHT HAND  Result Value Ref Range Status   Specimen Description BLOOD RIGHT HAND  Final   Special Requests   Final    BOTTLES DRAWN AEROBIC AND ANAEROBIC Blood Culture adequate volume   Culture   Final    NO GROWTH 4 DAYS Performed at [facility], [place] [address], [place], [place] [address]    Report Status PENDING  Incomplete  MRSA Next Gen by PCR, Nasal     Status: Abnormal    Collection Time: 02/22/22 12:32 PM  Specimen: Anterior Nasal Swab  Result Value Ref Range Status   MRSA by PCR Next Gen DETECTED (A) NOT DETECTED Final    Comment: RESULT CALLED TO, READ BACK BY AND VERIFIED WITH: M FUTRELL,RN@2047  02/22/22 [place] (NOTE) The GeneXpert MRSA Assay (FDA approved for NASAL specimens only), is one component of a comprehensive MRSA colonization surveillance program. It is not intended to diagnose MRSA infection nor to guide or monitor treatment for MRSA infections. Test performance is not FDA approved in patients less than 14 years old. Performed at [facility], [place] [address] [place], [place] [address]   SARS Coronavirus 2 by RT PCR (hospital order, performed in [facility] hospital lab) *cepheid single result test* Anterior Nasal Swab     Status: None   Collection Time: 02/22/22 12:32 PM   Specimen: Anterior Nasal Swab  Result Value Ref Range Status   SARS Coronavirus 2 by RT PCR NEGATIVE NEGATIVE Final    Comment: (NOTE) SARS-CoV-2 target nucleic acids are NOT DETECTED.  The SARS-CoV-2 RNA is generally detectable in upper and lower respiratory specimens during the acute phase of infection. The lowest concentration of SARS-CoV-2 viral copies this assay can detect is 250 copies / mL. A negative result does not preclude SARS-CoV-2 infection and should not be used as the sole basis for treatment or other patient management decisions.  A negative result may occur with improper specimen collection / handling, submission of specimen other than nasopharyngeal swab, presence of viral mutation(s) within the areas targeted by this assay, and inadequate number of viral copies (<250 copies / mL). A negative result must be combined with clinical observations, patient history, and epidemiological information.  Fact Sheet for Patients:   https://www.patel.info/  Fact Sheet for Healthcare  Providers: https://hall.com/  This test is not yet approved or  cleared by the [place] FDA and has been authorized for detection and/or diagnosis of SARS-CoV-2 by FDA under an Emergency Use Authorization (EUA).  This EUA will remain in effect (meaning this test can be used) for the duration of the COVID-19 declaration under Section 564(b)(1) of the Act, 21 U.S.C. section 360bbb-3(b)(1), unless the authorization is terminated or revoked sooner.  Performed at [facility], [place] [address] [place], [place] [address]      Time coordinating discharge: Over 30 minutes  SIGNED:   Little Ishikawa, DO [facility] 02/26/2022, 4:34 PM Pager   If 7PM-7AM, please contact night-coverage [email or web address]

## 2022-02-26 NOTE — Progress Notes (Signed)
D/C order noted. Contacted [facility] SW to advise clinic of pt's d/c today and that pt will resume care tomorrow.   Yissel Habermehl Renal Navigator [phone] 

## 2022-02-26 NOTE — Telephone Encounter (Signed)
Caller: Mike ([employer]) Fax: [phone]  Call back: [phone] ext [phone]  Medical supplies is requesting office notes from 4/24   Please advise

## 2022-02-27 ENCOUNTER — Encounter (HOSPITAL_COMMUNITY): Payer: Medicare Other

## 2022-02-27 ENCOUNTER — Other Ambulatory Visit: Payer: Self-pay | Admitting: Internal Medicine

## 2022-02-27 ENCOUNTER — Telehealth: Payer: Self-pay

## 2022-02-27 NOTE — Telephone Encounter (Signed)
Faxed OV notes

## 2022-02-27 NOTE — Telephone Encounter (Signed)
1st attempt- NA couldn't leave a message

## 2022-03-03 ENCOUNTER — Other Ambulatory Visit (HOSPITAL_BASED_OUTPATIENT_CLINIC_OR_DEPARTMENT_OTHER): Payer: Self-pay

## 2022-03-03 ENCOUNTER — Ambulatory Visit (INDEPENDENT_AMBULATORY_CARE_PROVIDER_SITE_OTHER): Payer: Medicare Other | Admitting: Family Medicine

## 2022-03-03 ENCOUNTER — Encounter: Payer: Self-pay | Admitting: Family Medicine

## 2022-03-03 VITALS — BP 108/60 | HR 59 | Temp 98.1°F | Ht 65.0 in | Wt 127.5 lb

## 2022-03-03 DIAGNOSIS — R569 Unspecified convulsions: Secondary | ICD-10-CM

## 2022-03-03 DIAGNOSIS — R5381 Other malaise: Secondary | ICD-10-CM

## 2022-03-03 DIAGNOSIS — R630 Anorexia: Secondary | ICD-10-CM

## 2022-03-03 MED ORDER — MEGESTROL ACETATE 625 MG/5ML PO SUSP
625.0000 mg | Freq: Every day | ORAL | 1 refills | Status: DC
  Filled 2022-03-03: qty 150, 30d supply, fill #0
  Filled 2022-09-23: qty 150, 30d supply, fill #1

## 2022-03-03 NOTE — Patient Instructions (Addendum)
If you do not hear anything about your referral in the next 1-2 weeks, call our office and ask for an update.  Stay on the [place] for now.   Consider Glucerna for protein supplement.   I am OK with Premier, Ensure as well.   Let us know if you need anything.

## 2022-03-03 NOTE — Progress Notes (Signed)
Chief Complaint  Patient presents with   Hospitalization Follow-up    Subjective: Patient is a 66 y.o. male here for hosp f/u. Here w spouse and family friend who helps interpret ([place]).   Patient was recently admitted to [facility] for encephalopathy and volume overload.  He was actually readmitted after an incorrect diagnosis of pneumonia.  Dialysis has been taking out around 3 L as opposed to the normal 5.  He had swelling in his legs in addition to shortness of breath.  There was some question of seizures and he was placed on Keppra.  He had an EEG done in November 2022 which was unremarkable.  He never had neurology follow-up.  His wife reports he was tapping his feet together which prompted the hospital team to restart the [place] after speaking with the on-call neurologist.  Patient is breathing much better and no longer having any swelling.  He is very weak though this is only slightly better.  Appetite is not much better.  He is down around 10 pounds overall.  He has a 1 L fluid restriction and has no difficulties staying within parameters.  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 No accessory muscle use Mouth: MMM Psych: Age appropriate judgment and insight, normal affect and mood  Assessment and Plan: Seizure-like activity ([place]) - Plan: Ambulatory referral to Neurology  Poor appetite - Plan: megestrol (MEGACE ES) 625 MG/5ML suspension  Physical deconditioning  Unclear whether he was having seizure-like activity, particularly in the setting of encephalopathy.  We will have him continue Keppra for now.  Will refer to neurology for further outpatient evaluation as I would rather have their group tell him he can stop the medication rather than me. Trial Megace.  Given the [place] he is taking, would prefer to avoid QT prolonging agents like Remeron or Paxil.  Could consider Marinol.  Recommended replacing some water with a supplemental meal replacement/protein drink. Offered physical therapy.  The patient and his wife politely declined at this time.  Hopefully improving his oral intake will help with strength. The patient, his wife, and friend voiced understanding and agreement to the plan.  I spent 45 minutes discussing the above plans with the patient and his friend/spouse, reviewing his 2 hospitalizations, and reviewing his chart on the same day of the visit.  [place], DO 03/03/22  2:27 PM

## 2022-03-04 ENCOUNTER — Other Ambulatory Visit (HOSPITAL_BASED_OUTPATIENT_CLINIC_OR_DEPARTMENT_OTHER): Payer: Self-pay

## 2022-03-05 ENCOUNTER — Ambulatory Visit: Payer: Medicare Other | Admitting: Podiatry

## 2022-03-05 ENCOUNTER — Other Ambulatory Visit (HOSPITAL_BASED_OUTPATIENT_CLINIC_OR_DEPARTMENT_OTHER): Payer: Self-pay

## 2022-03-06 ENCOUNTER — Other Ambulatory Visit (HOSPITAL_BASED_OUTPATIENT_CLINIC_OR_DEPARTMENT_OTHER): Payer: Self-pay

## 2022-03-06 ENCOUNTER — Encounter: Payer: Self-pay | Admitting: Neurology

## 2022-03-10 ENCOUNTER — Ambulatory Visit (HOSPITAL_COMMUNITY)
Admit: 2022-03-10 | Discharge: 2022-03-10 | Disposition: A | Discharge status: Home / Self Care | Payer: Medicare Other | Source: Ambulatory Visit | Attending: Family Medicine | Admitting: Family Medicine

## 2022-03-10 ENCOUNTER — Encounter (HOSPITAL_COMMUNITY): Payer: Self-pay

## 2022-03-10 VITALS — BP 158/60 | HR 64 | Wt 136.0 lb

## 2022-03-10 DIAGNOSIS — I480 Paroxysmal atrial fibrillation: Secondary | ICD-10-CM

## 2022-03-10 DIAGNOSIS — M545 Low back pain, unspecified: Secondary | ICD-10-CM

## 2022-03-10 DIAGNOSIS — G40909 Epilepsy, unspecified, not intractable, without status epilepticus: Secondary | ICD-10-CM | POA: Diagnosis not present

## 2022-03-10 DIAGNOSIS — E1140 Type 2 diabetes mellitus with diabetic neuropathy, unspecified: Secondary | ICD-10-CM | POA: Insufficient documentation

## 2022-03-10 DIAGNOSIS — E785 Hyperlipidemia, unspecified: Secondary | ICD-10-CM | POA: Diagnosis not present

## 2022-03-10 DIAGNOSIS — J439 Emphysema, unspecified: Secondary | ICD-10-CM

## 2022-03-10 DIAGNOSIS — I255 Ischemic cardiomyopathy: Secondary | ICD-10-CM | POA: Diagnosis not present

## 2022-03-10 DIAGNOSIS — Z7901 Long term (current) use of anticoagulants: Secondary | ICD-10-CM | POA: Insufficient documentation

## 2022-03-10 DIAGNOSIS — Z87891 Personal history of nicotine dependence: Secondary | ICD-10-CM | POA: Diagnosis not present

## 2022-03-10 DIAGNOSIS — Z79899 Other long term (current) drug therapy: Secondary | ICD-10-CM | POA: Diagnosis not present

## 2022-03-10 DIAGNOSIS — D631 Anemia in chronic kidney disease: Secondary | ICD-10-CM

## 2022-03-10 DIAGNOSIS — I10 Essential (primary) hypertension: Secondary | ICD-10-CM

## 2022-03-10 DIAGNOSIS — I25810 Atherosclerosis of coronary artery bypass graft(s) without angina pectoris: Secondary | ICD-10-CM | POA: Insufficient documentation

## 2022-03-10 DIAGNOSIS — Z955 Presence of coronary angioplasty implant and graft: Secondary | ICD-10-CM | POA: Diagnosis not present

## 2022-03-10 DIAGNOSIS — I252 Old myocardial infarction: Secondary | ICD-10-CM | POA: Diagnosis not present

## 2022-03-10 DIAGNOSIS — I132 Hypertensive heart and chronic kidney disease with heart failure and with stage 5 chronic kidney disease, or end stage renal disease: Secondary | ICD-10-CM | POA: Insufficient documentation

## 2022-03-10 DIAGNOSIS — N186 End stage renal disease: Secondary | ICD-10-CM

## 2022-03-10 DIAGNOSIS — I5042 Chronic combined systolic (congestive) and diastolic (congestive) heart failure: Secondary | ICD-10-CM | POA: Insufficient documentation

## 2022-03-10 DIAGNOSIS — I5032 Chronic diastolic (congestive) heart failure: Secondary | ICD-10-CM

## 2022-03-10 DIAGNOSIS — Z992 Dependence on renal dialysis: Secondary | ICD-10-CM

## 2022-03-10 DIAGNOSIS — R627 Adult failure to thrive: Secondary | ICD-10-CM | POA: Insufficient documentation

## 2022-03-10 DIAGNOSIS — I2510 Atherosclerotic heart disease of native coronary artery without angina pectoris: Secondary | ICD-10-CM

## 2022-03-10 DIAGNOSIS — G8929 Other chronic pain: Secondary | ICD-10-CM | POA: Diagnosis not present

## 2022-03-10 DIAGNOSIS — R5381 Other malaise: Secondary | ICD-10-CM

## 2022-03-10 DIAGNOSIS — Z794 Long term (current) use of insulin: Secondary | ICD-10-CM | POA: Insufficient documentation

## 2022-03-10 DIAGNOSIS — E1122 Type 2 diabetes mellitus with diabetic chronic kidney disease: Secondary | ICD-10-CM | POA: Insufficient documentation

## 2022-03-10 DIAGNOSIS — E118 Type 2 diabetes mellitus with unspecified complications: Secondary | ICD-10-CM

## 2022-03-11 ENCOUNTER — Other Ambulatory Visit (HOSPITAL_COMMUNITY): Payer: Self-pay | Admitting: Cardiology

## 2022-03-11 DIAGNOSIS — I6523 Occlusion and stenosis of bilateral carotid arteries: Secondary | ICD-10-CM

## 2022-03-12 ENCOUNTER — Ambulatory Visit (HOSPITAL_COMMUNITY)
Admission: RE | Admit: 2022-03-12 | Discharge: 2022-03-12 | Disposition: A | Discharge status: Home / Self Care | Payer: Medicare Other | Source: Ambulatory Visit | Attending: Cardiology | Admitting: Cardiology

## 2022-03-12 DIAGNOSIS — I6523 Occlusion and stenosis of bilateral carotid arteries: Secondary | ICD-10-CM | POA: Diagnosis present

## 2022-03-20 ENCOUNTER — Telehealth (HOSPITAL_COMMUNITY): Payer: Self-pay | Admitting: *Deleted

## 2022-03-20 NOTE — Telephone Encounter (Signed)
Received fax from [facility], pt needs clearance for L3-4 Laminectomy with Dr Annette Stable under general anesthesia  Per Dr Aundra Dubin: pt moderate risk, can hold Eliquis 3 days prior.  Form along with 6/27 OV note faxed back to them at [phone]

## 2022-03-24 ENCOUNTER — Other Ambulatory Visit: Payer: Self-pay | Admitting: Family Medicine

## 2022-03-24 ENCOUNTER — Other Ambulatory Visit (HOSPITAL_BASED_OUTPATIENT_CLINIC_OR_DEPARTMENT_OTHER): Payer: Self-pay

## 2022-03-24 ENCOUNTER — Ambulatory Visit: Payer: Medicare Other | Admitting: Family Medicine

## 2022-03-24 MED ORDER — LEVETIRACETAM 250 MG PO TABS
250.0000 mg | ORAL_TABLET | ORAL | 0 refills | Status: DC
Start: 2022-03-25 — End: 2022-04-14
  Filled 2022-03-24: qty 12, 28d supply, fill #0

## 2022-03-24 MED ORDER — LEVETIRACETAM 500 MG PO TABS
500.0000 mg | ORAL_TABLET | Freq: Every day | ORAL | 0 refills | Status: DC
  Filled 2022-03-24: qty 30, 30d supply, fill #0

## 2022-03-24 NOTE — Telephone Encounter (Signed)
I called and spoke to his wife and she said yes he is taking both the 250 and 500.

## 2022-03-24 NOTE — Telephone Encounter (Signed)
Is he taking this in addition to his 500 mg tab?

## 2022-03-25 NOTE — Telephone Encounter (Signed)
Office visit notes from 3/28 and 6/20 faxed 7/11 appt the patient no showed. Made note of PCP response we do not keep detailed BS readings.

## 2022-03-25 NOTE — Telephone Encounter (Signed)
Ruby from [employer] [phone] ext [phone]  Bertram Millard is calling to let us know that the office notes from 04/24 don't show that the patient used the Free style El[place]or [place] that they supplied. They would like to know if the patient is still using it and if he is, if our office is keeping a log of the readings. Please advise.

## 2022-03-25 NOTE — Telephone Encounter (Signed)
Ruby also stated that they need the office notes from 03/28 , 06/20, 07/11 faxed over to them. Please advise.   Fax: [phone]

## 2022-03-26 ENCOUNTER — Ambulatory Visit (INDEPENDENT_AMBULATORY_CARE_PROVIDER_SITE_OTHER): Payer: Medicare Other | Admitting: Neurology

## 2022-03-26 ENCOUNTER — Encounter: Payer: Self-pay | Admitting: Neurology

## 2022-03-26 VITALS — BP 184/76 | HR 71 | Ht 67.0 in | Wt 128.0 lb

## 2022-03-26 DIAGNOSIS — R404 Transient alteration of awareness: Secondary | ICD-10-CM | POA: Diagnosis not present

## 2022-03-26 DIAGNOSIS — G40909 Epilepsy, unspecified, not intractable, without status epilepticus: Secondary | ICD-10-CM

## 2022-03-26 NOTE — Progress Notes (Signed)
NEUROLOGY CONSULTATION NOTE  ETHER WOLTERS MRN: [id number] DOB: 09-20-1955  Referring provider: Dr. Riki Sheer Primary care provider: Dr. Riki Sheer  Reason for consult:  seizure  Dear Dr. Nani Ravens:  Thank you for your kind referral of Kenneth [name] for consultation of the above symptoms. Although his history is well known to you, please allow me to reiterate it for the purpose of our medical record. The patient was accompanied to the clinic by his wife who also provides collateral information. A [place] medical interpreter, Sumni, helps with translation. Records and images were personally reviewed where available.   HISTORY OF PRESENT ILLNESS: This is a 66 year old right-handed man with a history of hypertension, hyperlipidemia, DM, CAD s/p CABG, ESRD on HD, paroxysmal atrial fibrillation, toxoplama uveitis, presenting for evaluation of seizures. He has been evaluated by inpatient Neurology several times since 2019. He was admitted to [facility] for a cardiac catheterization and stents were placed in 03/2018. While doing physical therapy, he was noted to have left sided-weakness and intermittent decreased level of consciousness. Exam noted a lot of inattention. MRI brain did not show any acute changes, there was mild chronic microvascular disease, small chronic left frontal cortex infarct. 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 He was found to have atrial fibrillation and started on Eliquis. In 08/2019, he was brought to the ER after he had chest pain then suddenly became unresponsive. On initial exam, he was initially unresponsive and smelling salts were put under his nose. He began to cough and sit up 30 seconds later, eyes were opened but he would not answer. Passive arm elevation and drop noted it would fall away from his face. He clenched his eyes tight with attempts to open. MRI brain no acute changes, EEG normal. There was  concern for embellishment/factitious disorder, but when he was back to baseline the next day and reported feeling dizzy after taking Baclofen, it was favored that he had Baclofen toxicity in the setting of ESRD. He was evaluated in 07/2021 for lethargy and severe hyperkalemia (K > 7.5) with EKG changes and report of seizure witnessed by ER staff when transferring from stretcher to hospital bed ("patient became unresponsive, gurgling respirations, appeared to have seizure-like activity). He reported a 65-month history of daily posterior headaches and vision changes, his wife reproted that for the past 6 months, he would stare off into the distance with jaw opening and closing with subsequent unresponsive state for less than a minute followed by confusion. His EEG was limited by movement artifact but within normal limits. He was started on Levetiracetam but it appears he did not continue this on hospital discharge. He was admitted twice to [facility] in June for shortness of breath. During the second admission on 6/13, he was very lethargic and wife reported he was altered. Since he was not taking the LEV, it was started at 500mg  qhs with additional 250mg  after HD.   His wife reports the staring episodes have occurred since he got sick for several years now. She is not sure if they are seizures, she would get his attention and starts answering a little later. She states he may just be daydreaming or tired. She notices it on a daily basis and just leaves him alone. There is no associated tongue bite, incontinence, or jerking/convulsive activity.  He keeps his eyes closed when not stimulated, but opens eyes and answers appropriately otherwise. He says he feels tired.  He denies any headaches. He fell this morning and reported dizziness en route to the clinic, his wife notes his sugar dropped to 61. He states he only feels dizzy when hypoglycemic. He denies any olfactory/gustatory hallucinations, rising epigastric sensation,  focal numbness/tingling/weakness, myoclonic jerks. He denies any diplopia, dysarthria/dysphagia, neck/back pain. He has numbness and tingling in both feet from neuropathy. He has a rollator usually when he is outside and has a cane today, but does not use any assistive devices at home. His wife reports he has HD three times a week so he sleeps during the day and is awake at night. He had a normal birth and early development.  There is no history of febrile convulsions, CNS infections such as meningitis/encephalitis, significant traumatic brain injury, neurosurgical procedures, or family history of seizures.    PAST MEDICAL HISTORY: Past Medical History:  Diagnosis Date   CAD (coronary artery disease) 10/30/2016   NSTEMI 9/17 with CABG x 3 (LIMA-LAD, SVG-OM, SVG-RCA).   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   type 1 DM   ESRD on hemodialysis ([place])    "MWF; [place]" (03/30/2018)   Gastroesophageal reflux disease 08/18/2016   Hyperlipidemia    Hypertension    Ischemic rest pain of lower extremity 02/06/2015   Keratoma 02/27/2015   Metatarsal deformity 02/27/2015   PAF (paroxysmal atrial fibrillation) ([facility])    Pronation deformity of both feet 02/27/2015    PAST SURGICAL HISTORY: Past Surgical History:  Procedure Laterality Date   A/V FISTULAGRAM Left 03/06/2021   Procedure: A/V FISTULAGRAM;  Surgeon: Marty Heck, MD;  Location: [place] [facility];  Service: Cardiovascular;  Laterality: Left;   AV FISTULA PLACEMENT Left 06/22/2016   Procedure: ARTERIOVENOUS (AV) FISTULA CREATION LEFT UPPER ARM;  Surgeon: Rosetta Posner, MD;  Location: [place];  Service: Vascular;  Laterality: Left;   BIOPSY  12/19/2020   Procedure: BIOPSY;  Surgeon: Ladene Artist, MD;  Location: [place];  Service: Endoscopy;;   CARDIAC CATHETERIZATION N/A 05/31/2016    Procedure: Left Heart Cath and Coronary Angiography;  Surgeon: Jettie Booze, MD;  Location: [place] [facility];  Service: Cardiovascular;  Laterality: N/A;   CARDIAC CATHETERIZATION N/A 05/31/2016   Procedure: Right Heart Cath;  Surgeon: Jettie Booze, MD;  Location: [place] [facility];  Service: Cardiovascular;  Laterality: N/A;   CARDIAC CATHETERIZATION N/A 05/31/2016   Procedure: IABP Insertion;  Surgeon: Jettie Booze, MD;  Location: [place] [facility];  Service: Cardiovascular;  Laterality: N/A;   COLONOSCOPY WITH PROPOFOL N/A 12/20/2020   Procedure: COLONOSCOPY WITH PROPOFOL;  Surgeon: Mauri Pole, MD;  Location: [place] [facility];  Service: Endoscopy;  Laterality: N/A;   CORONARY ARTERY BYPASS GRAFT N/A 06/05/2016   Procedure: CORONARY ARTERY BYPASS GRAFTING (CABG) x3 LIMA to LAD -SVG to OM -SVG to RCA;  Surgeon: Ivin Poot, MD;  Location: [place];  Service: Open Heart Surgery;  Laterality: N/A;   CORONARY STENT INTERVENTION N/A 07/07/2017   Procedure: CORONARY STENT INTERVENTION;  Surgeon: Wellington Hampshire, MD;  Location: [place] [facility];  Service: Cardiovascular;  Laterality: N/A;   CORONARY STENT INTERVENTION N/A 03/30/2018   Procedure: CORONARY STENT INTERVENTION;  Surgeon: Martinique, Peter M, MD;  Location: [place] [facility];  Service: Cardiovascular;  Laterality: N/A;   ESOPHAGOGASTRODUODENOSCOPY (  EGD) WITH PROPOFOL N/A 12/19/2020   Procedure: ESOPHAGOGASTRODUODENOSCOPY (EGD) WITH PROPOFOL;  Surgeon: Ladene Artist, MD;  Location: [facility];  Service: Endoscopy;  Laterality: N/A;   GIVENS CAPSULE STUDY N/A 12/20/2020   Procedure: GIVENS CAPSULE STUDY;  Surgeon: Mauri Pole, MD;  Location: [place];  Service: Endoscopy;  Laterality: N/A;   INSERTION OF DIALYSIS CATHETER N/A 06/05/2016   Procedure: INSERTION OF DIALYSIS/trialysis CATHETER;  Surgeon: Ivin Poot, MD;  Location: [place];  Service: Vascular;  Laterality: N/A;   INSERTION OF DIALYSIS  CATHETER Right 06/13/2016   Procedure: INSERTION OF DIALYSIS CATHETER RIGHT INTERNAL JUGULAR;  Surgeon: Conrad Lutcher, MD;  Location: [place];  Service: Vascular;  Laterality: Right;   INSERTION OF DIALYSIS CATHETER Right 09/07/2019   Procedure: Insertion Of Dialysis Catheter;  Surgeon: Rosetta Posner, MD;  Location: [facility] [facility];  Service: Vascular;  Laterality: Right;   INTRAOPERATIVE TRANSESOPHAGEAL ECHOCARDIOGRAM N/A 06/05/2016   Procedure: INTRAOPERATIVE TRANSESOPHAGEAL ECHOCARDIOGRAM;  Surgeon: Ivin Poot, MD;  Location: [place];  Service: Open Heart Surgery;  Laterality: N/A;   LEFT HEART CATH AND CORS/GRAFTS ANGIOGRAPHY N/A 11/03/2016   Procedure: Left Heart Cath and Cors/Grafts Angiography;  Surgeon: Troy Sine, MD;  Location: [place] [facility];  Service: Cardiovascular;  Laterality: N/A;   LEFT HEART CATH AND CORS/GRAFTS ANGIOGRAPHY N/A 07/07/2017   Procedure: LEFT HEART CATH AND CORS/GRAFTS ANGIOGRAPHY;  Surgeon: Larey Dresser, MD;  Location: [place] [facility];  Service: Cardiovascular;  Laterality: N/A;   LEFT HEART CATH AND CORS/GRAFTS ANGIOGRAPHY N/A 03/30/2018   Procedure: LEFT HEART CATH AND CORS/GRAFTS ANGIOGRAPHY;  Surgeon: Martinique, Peter M, MD;  Location: [place] [facility];  Service: Cardiovascular;  Laterality: N/A;   LEFT HEART CATH AND CORS/GRAFTS ANGIOGRAPHY N/A 04/17/2019   Procedure: LEFT HEART CATH AND CORS/GRAFTS ANGIOGRAPHY;  Surgeon: Larey Dresser, MD;  Location: [place] [facility];  Service: Cardiovascular;  Laterality: N/A;   PERIPHERAL VASCULAR INTERVENTION Left 03/06/2021   Procedure: PERIPHERAL VASCULAR INTERVENTION;  Surgeon: Marty Heck, MD;  Location: [place] [facility];  Service: Cardiovascular;  Laterality: Left;   POLYPECTOMY  12/20/2020   Procedure: POLYPECTOMY;  Surgeon: Mauri Pole, MD;  Location: [place];  Service: Endoscopy;;   REVISION OF ARTERIOVENOUS GORETEX GRAFT Left 72/62/0355   Procedure: PLICATION OF ANEURYSM OF ARTERIOVENOUS  FISTULA  LEFT ARM;  Surgeon: Rosetta Posner, MD;  Location: [place];  Service: Vascular;  Laterality: Left;   REVISON OF ARTERIOVENOUS FISTULA Left 09/07/2019   Procedure: REVISON OF LEFT UPPER ARM ARTERIOVENOUS FISTULA;  Surgeon: Rosetta Posner, MD;  Location: [place];  Service: Vascular;  Laterality: Left;   RIGHT/LEFT HEART CATH AND CORONARY ANGIOGRAPHY N/A 01/03/2021   Procedure: RIGHT/LEFT HEART CATH AND CORONARY ANGIOGRAPHY;  Surgeon: Larey Dresser, MD;  Location: [place] [facility];  Service: Cardiovascular;  Laterality: N/A;    MEDICATIONS: Current Outpatient Medications on File Prior to Visit  Medication Sig Dispense Refill   apixaban (ELIQUIS) 5 MG TABS tablet Take 1 tablet by mouth 2 times daily 60 tablet 11   atorvastatin (LIPITOR) 80 MG tablet Take 1 tablet (80 mg total) by mouth daily. 90 tablet 3   calcium acetate (PHOSLO) 667 MG capsule Take 667 mg by mouth 3 (three) times daily with meals.      carvedilol (COREG) 6.25 MG tablet Take 1 tablet (6.25 mg total) by mouth 2 (two) times daily with a meal. Hold on the mornings of dialysis.  60 tablet 3   Continuous Blood Gluc Sensor (FREESTYLE LIBRE 2 SENSOR) MISC 1 Device by Does not apply route every 14 (fourteen) days. 6 each 3   diclofenac Sodium (VOLTAREN) 1 % GEL Apply 4 grams topically 4 (four) times daily. 100 g 2   ezetimibe (ZETIA) 10 MG tablet Take 1 tablet by mouth daily 90 tablet 3   glucose blood (ONETOUCH VERIO) test strip 1 each by Other route in the morning, at noon, in the evening, and at bedtime. Use as instructed 400 each 2   hydrALAZINE (APRESOLINE) 100 MG tablet Take 1 tablet (100 mg total) by mouth 3 (three) times daily. DO NOT TAKE AM AND NOON DOSES ON HD DAYS 270 tablet 2   Insulin Lispro-aabc (LYUMJEV) 100 UNIT/ML SOLN Max Daily 30 units per insulin pump 30 mL 6   Insulin Pen Needle (PEN NEEDLES) 32G X 6 MM MISC Use as directed 3 times daily 300 each 1   isosorbide mononitrate (IMDUR) 60 MG 24 hr tablet Take 1&1/2  tablets (90 mg total) by mouth daily. Hold on days of dialysis on Mon, Wed, Fri. 90 tablet 5   levETIRAcetam (KEPPRA) 250 MG tablet Take 1 tablet (250 mg total) by mouth every Monday, Wednesday, and Friday at 8 PM. 12 tablet 0   levETIRAcetam (KEPPRA) 500 MG tablet Take 1 tablet (500 mg total) by mouth at bedtime. 30 tablet 0   megestrol (MEGACE ES) 625 MG/5ML suspension Take 5 mLs (625 mg total) by mouth daily. 150 mL 1   pantoprazole (PROTONIX) 40 MG tablet Take 1 tablet (40 mg total) by mouth 2 (two) times daily. 180 tablet 1   sertraline (ZOLOFT) 50 MG tablet Take 1 tablet (50 mg total) by mouth daily. 90 tablet 2   Sodium Zirconium Cyclosilicate (LOKELMA PO) Take 1 packet by mouth 4 (four) times a week. Hold on dialysis days     Tiotropium Bromide-Olodaterol (STIOLTO RESPIMAT) 2.5-2.5 MCG/ACT AERS Inhale 2 puffs by mouth into the lungs daily. 4 g 11   No current facility-administered medications on file prior to visit.    ALLERGIES: No Known Allergies  FAMILY HISTORY: Family History  Problem Relation Age of Onset   CAD Father    Colon cancer Father    Diabetes Brother     SOCIAL HISTORY: Social History   Socioeconomic History   Marital status: Married    Spouse name: Not on file   Number of children: Not on file   Years of education: Not on file   Highest education level: Not on file  Occupational History   Not on file  Tobacco Use   Smoking status: Former    Packs/day: 1.50    Years: 35.00    Total pack years: 52.50    Types: Cigarettes    Quit date: 2014    Years since quitting: 9.5   Smokeless tobacco: Never   Tobacco comments:    "quit e-cigs ~ 2014"  Vaping Use   Vaping Use: Former  Substance and Sexual Activity   Alcohol use: Never    Alcohol/week: 0.0 standard drinks of alcohol   Drug use: Never   Sexual activity: Not on file  Other Topics Concern   Not on file  Social History Narrative   Right handed    Social Determinants of Health   Financial  Resource Strain: Not on file  Food Insecurity: Not on file  Transportation Needs: Not on file  Physical Activity: Not on file  Stress: Not  on file  Social Connections: Not on file  Intimate Partner Violence: Not on file     PHYSICAL EXAM: Vitals:   03/26/22 0853  BP: (!) 184/76  Pulse: 71  SpO2: 99%   General: No acute distress Head:  Normocephalic/atraumatic Skin/Extremities: No rash, no edema Neurological Exam: Mental status: alert and awake when stimulated, otherwise he keeps his eyes closed. No dysarthria or aphasia, Fund of knowledge is appropriate.  Attention and concentration are reduced.  Cranial nerves: CN I: not tested CN II: pupils equal, round and reactive to light, visual fields intact CN III, IV, VI:  full range of motion, no nystagmus, no ptosis CN V: facial sensation intact CN VII: upper and lower face symmetric CN VIII: hearing intact to conversation Bulk & Tone: normal, no fasciculations. Motor: 5/5 throughout with no pronator drift. Sensation: intact to light touch, cold, pin, vibration sense.  No extinction to double simultaneous stimulation.  Deep Tendon Reflexes: +1 throughout Cerebellar: no incoordination on finger to nose testing Gait: slightly wide-based with cane, good strides, no ataxia Tremor: none   IMPRESSION: This is a 66 year old right-handed man with a history of hypertension, hyperlipidemia, DM, CAD s/p CABG, ESRD on HD, paroxysmal atrial fibrillation, toxoplama uveitis, presenting for evaluation of seizures. He has had admissions for altered mental status since 2019 and one witnessed episode in the ER last 07/2021 where he became unresponsive with gurgling respirations and appeared to have seizure-like activity in the setting of several metabolic derangements. His exam today is overall normal. Repeat brain MRI studies and 2 EEGs were normal (although one was limited by movement). His wife continues to report daily episodes where he is staring  and slow to respond, unclear if epileptic. He is on Levetiracetam 500mg  qhs with additional 250mg  after HD. We discussed doing a 48-hour EEG to characterize symptoms. Follow-up in 3 months, call for any changes.   Thank you for allowing me to participate in the care of this patient. Please do not hesitate to call for any questions or concerns.   Ellouise Newer, M.D.  CC: Dr. Nani Ravens

## 2022-03-26 NOTE — Patient Instructions (Signed)
Good to meet you.  Schedule 2-day home EEG  2. Continue Keppra 500mg  every night and 250mg  after dialysis three times a week  3. Follow-up in 3 months, call for any changes   Seizure Precautions: 1. If medication has been prescribed for you to prevent seizures, take it exactly as directed.  Do not stop taking the medicine without talking to your doctor first, even if you have not had a seizure in a long time.   2. Avoid activities in which a seizure would cause danger to yourself or to others.  Don't operate dangerous machinery, swim alone, or climb in high or dangerous places, such as on ladders, roofs, or girders.  Do not drive unless your doctor says you may.  3. If you have any warning that you may have a seizure, lay down in a safe place where you can't hurt yourself.    4.  No driving for 6 months from last seizure, as per [facility].   Please refer to the following link on the [employer] website for more information: http://[email or web address]/[email or web address]/drivingu.cfm   5.  Maintain good sleep hygiene. Avoid alcohol.  6.  Contact your doctor if you have any problems that may be related to the medicine you are taking.  7.  Call 911 and bring the patient back to the ED if:        A.  The seizure lasts longer than 5 minutes.       B.  The patient doesn't awaken shortly after the seizure  C.  The patient has new problems such as difficulty seeing, speaking or moving  D.  The patient was injured during the seizure  E.  The patient has a temperature over 102 F (39C)  F.  The patient vomited and now is having trouble breathing

## 2022-03-30 NOTE — H&P (View-Only) (Signed)
[facility] [place]  ASSESSMENT / PLAN: Kenneth [name] is a 66 y.o. male with aneurysmal, hypopigmented left upper extremity arteriovenous fistula which needs revision to avoid ulceration and potential rupture.  Plan to do this on a nondialysis day in the near future.  CHIEF COMPLAINT: aneurysmal AVF  HISTORY OF PRESENT ILLNESS: Kenneth [name] is a 66 y.o. male well-known to our service for hemodialysis access.  He had a left brachiocephalic arteriovenous fistula created for him before 2020.  He has had several revisions by my partner Dr. Sherren Mocha Early.  He has had recurrence of aneurysmal formation with hyperpigmentation and early ulceration.  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   type 1 DM   ESRD on hemodialysis ([place])    "MWF; [facility]" (03/30/2018)   Gastroesophageal reflux disease 08/18/2016   Hyperlipidemia    Hypertension    Ischemic rest pain of lower extremity 02/06/2015   Keratoma 02/27/2015   Metatarsal deformity 02/27/2015   PAF (paroxysmal atrial fibrillation) (H[facility])    Pronation deformity of both feet 02/27/2015    Past Surgical History:  Procedure Laterality Date   A/V FISTULAGRAM Left 03/06/2021   Procedure: A/V FISTULAGRAM;  Surgeon: Marty Heck, MD;  Location: [facility];  Service: Cardiovascular;  Laterality: Left;   AV FISTULA PLACEMENT Left 06/22/2016   Procedure: ARTERIOVENOUS (AV) FISTULA CREATION LEFT UPPER ARM;  Surgeon: Rosetta Posner, MD;  Location: [facility];  Service: Vascular;  Laterality: Left;   BIOPSY  12/19/2020   Procedure: BIOPSY;  Surgeon: Ladene Artist, MD;  Location: [facility];  Service: Endoscopy;;   CARDIAC CATHETERIZATION N/A  05/31/2016   Procedure: Left Heart Cath and Coronary Angiography;  Surgeon: Jettie Booze, MD;  Location: [facility];  Service: Cardiovascular;  Laterality: N/A;   CARDIAC CATHETERIZATION N/A 05/31/2016   Procedure: Right Heart Cath;  Surgeon: Jettie Booze, MD;  Location: [facility];  Service: Cardiovascular;  Laterality: N/A;   CARDIAC CATHETERIZATION N/A 05/31/2016   Procedure: IABP Insertion;  Surgeon: Jettie Booze, MD;  Location: [facility];  Service: Cardiovascular;  Laterality: N/A;   COLONOSCOPY WITH PROPOFOL N/A 12/20/2020   Procedure: COLONOSCOPY WITH PROPOFOL;  Surgeon: Mauri Pole, MD;  Location: [facility];  Service: Endoscopy;  Laterality: N/A;   CORONARY ARTERY BYPASS GRAFT N/A 06/05/2016   Procedure: CORONARY ARTERY BYPASS GRAFTING (CABG) x3 LIMA to LAD -SVG to OM -SVG to RCA;  Surgeon: Ivin Poot, MD;  Location: [facility];  Service: Open Heart Surgery;  Laterality: N/A;   CORONARY STENT INTERVENTION N/A 07/07/2017   Procedure: CORONARY STENT INTERVENTION;  Surgeon: Wellington Hampshire, MD;  Location: [facility];  Service: Cardiovascular;  Laterality: N/A;   CORONARY STENT INTERVENTION N/A 03/30/2018   Procedure: CORONARY STENT INTERVENTION;  Surgeon: Martinique, Peter M, MD;  Location: [facility];  Service: Cardiovascular;  Laterality: N/A;   ESOPHAGOGASTRODUODENOSCOPY (EGD) WITH PROPOFOL N/A 12/19/2020   Procedure: ESOPHAGOGASTRODUODENOSCOPY (EGD) WITH PROPOFOL;  Surgeon: Ladene Artist, MD;  Location: [facility];  Service: Endoscopy;  Laterality: N/A;   GIVENS CAPSULE STUDY N/A 12/20/2020  Procedure: GIVENS CAPSULE STUDY;  Surgeon: Mauri Pole, MD;  Location: [facility];  Service: Endoscopy;  Laterality: N/A;   INSERTION OF DIALYSIS CATHETER N/A 06/05/2016   Procedure: INSERTION OF DIALYSIS/trialysis CATHETER;  Surgeon: Ivin Poot, MD;  Location: [place] [facility];  Service: Vascular;  Laterality: N/A;   INSERTION OF  DIALYSIS CATHETER Right 06/13/2016   Procedure: INSERTION OF DIALYSIS CATHETER RIGHT INTERNAL JUGULAR;  Surgeon: Conrad Smock, MD;  Location: [facility];  Service: Vascular;  Laterality: Right;   INSERTION OF DIALYSIS CATHETER Right 09/07/2019   Procedure: Insertion Of Dialysis Catheter;  Surgeon: Rosetta Posner, MD;  Location: [facility];  Service: Vascular;  Laterality: Right;   INTRAOPERATIVE TRANSESOPHAGEAL ECHOCARDIOGRAM N/A 06/05/2016   Procedure: INTRAOPERATIVE TRANSESOPHAGEAL ECHOCARDIOGRAM;  Surgeon: Ivin Poot, MD;  Location: [place];  Service: Open Heart Surgery;  Laterality: N/A;   LEFT HEART CATH AND CORS/GRAFTS ANGIOGRAPHY N/A 11/03/2016   Procedure: Left Heart Cath and Cors/Grafts Angiography;  Surgeon: Troy Sine, MD;  Location: [facility];  Service: Cardiovascular;  Laterality: N/A;   LEFT HEART CATH AND CORS/GRAFTS ANGIOGRAPHY N/A 07/07/2017   Procedure: LEFT HEART CATH AND CORS/GRAFTS ANGIOGRAPHY;  Surgeon: Larey Dresser, MD;  Location: [facility];  Service: Cardiovascular;  Laterality: N/A;   LEFT HEART CATH AND CORS/GRAFTS ANGIOGRAPHY N/A 03/30/2018   Procedure: LEFT HEART CATH AND CORS/GRAFTS ANGIOGRAPHY;  Surgeon: Martinique, Peter M, MD;  Location: [place] [facility];  Service: Cardiovascular;  Laterality: N/A;   LEFT HEART CATH AND CORS/GRAFTS ANGIOGRAPHY N/A 04/17/2019   Procedure: LEFT HEART CATH AND CORS/GRAFTS ANGIOGRAPHY;  Surgeon: Larey Dresser, MD;  Location: [facility];  Service: Cardiovascular;  Laterality: N/A;   PERIPHERAL VASCULAR INTERVENTION Left 03/06/2021   Procedure: PERIPHERAL VASCULAR INTERVENTION;  Surgeon: Marty Heck, MD;  Location: [place] [facility];  Service: Cardiovascular;  Laterality: Left;   POLYPECTOMY  12/20/2020   Procedure: POLYPECTOMY;  Surgeon: Mauri Pole, MD;  Location: [facility];  Service: Endoscopy;;   REVISION OF ARTERIOVENOUS GORETEX GRAFT Left 32/[birth year]1884   Procedure: PLICATION OF ANEURYSM OF  ARTERIOVENOUS FISTULA  LEFT ARM;  Surgeon: Rosetta Posner, MD;  Location: [facility];  Service: Vascular;  Laterality: Left;   REVISON OF ARTERIOVENOUS FISTULA Left 09/07/2019   Procedure: REVISON OF LEFT UPPER ARM ARTERIOVENOUS FISTULA;  Surgeon: Rosetta Posner, MD;  Location: [facility];  Service: Vascular;  Laterality: Left;   RIGHT/LEFT HEART CATH AND CORONARY ANGIOGRAPHY N/A 01/03/2021   Procedure: RIGHT/LEFT HEART CATH AND CORONARY ANGIOGRAPHY;  Surgeon: Larey Dresser, MD;  Location: [facility];  Service: Cardiovascular;  Laterality: N/A;    Family History  Problem Relation Age of Onset   CAD Father    Colon cancer Father    Diabetes Brother     Social History   Socioeconomic History   Marital status: Married    Spouse name: Not on file   Number of children: Not on file   Years of education: Not on file   Highest education level: Not on file  Occupational History   Not on file  Tobacco Use   Smoking status: Former    Packs/day: 1.50    Years: 35.00    Total pack years: 52.50    Types: Cigarettes    Quit date: 2014    Years since quitting: 9.5   Smokeless tobacco: Never   Tobacco comments:    "quit e-cigs ~ 2014"  Vaping  Use   Vaping Use: Former  Substance and Sexual Activity   Alcohol use: Never    Alcohol/week: 0.0 standard drinks of alcohol   Drug use: Never   Sexual activity: Not on file  Other Topics Concern   Not on file  Social History Narrative   Right handed    Social Determinants of Health   Financial Resource Strain: Not on file  Food Insecurity: Not on file  Transportation Needs: Not on file  Physical Activity: Not on file  Stress: Not on file  Social Connections: Not on file  Intimate Partner Violence: Not on file    No Known Allergies  Current Outpatient Medications  Medication Sig Dispense Refill   apixaban (ELIQUIS) 5 MG TABS tablet Take 1 tablet by mouth 2 times daily 60 tablet 11   atorvastatin (LIPITOR) 80 MG tablet Take 1 tablet  (80 mg total) by mouth daily. 90 tablet 3   calcium acetate (PHOSLO) 667 MG capsule Take 667 mg by mouth 3 (three) times daily with meals.      carvedilol (COREG) 6.25 MG tablet Take 1 tablet (6.25 mg total) by mouth 2 (two) times daily with a meal. Hold on the mornings of dialysis. 60 tablet 3   Continuous Blood Gluc Sensor (FREESTYLE LIBRE 2 SENSOR) MISC 1 Device by Does not apply route every 14 (fourteen) days. 6 each 3   diclofenac Sodium (VOLTAREN) 1 % GEL Apply 4 grams topically 4 (four) times daily. 100 g 2   ezetimibe (ZETIA) 10 MG tablet Take 1 tablet by mouth daily 90 tablet 3   glucose blood (ONETOUCH VERIO) test strip 1 each by Other route in the morning, at noon, in the evening, and at bedtime. Use as instructed 400 each 2   hydrALAZINE (APRESOLINE) 100 MG tablet Take 1 tablet (100 mg total) by mouth 3 (three) times daily. DO NOT TAKE AM AND NOON DOSES ON HD DAYS 270 tablet 2   Insulin Lispro-aabc (LYUMJEV) 100 UNIT/ML SOLN Max Daily 30 units per insulin pump 30 mL 6   Insulin Pen Needle (PEN NEEDLES) 32G X 6 MM MISC Use as directed 3 times daily 300 each 1   isosorbide mononitrate (IMDUR) 60 MG 24 hr tablet Take 1&1/2 tablets (90 mg total) by mouth daily. Hold on days of dialysis on Mon, Wed, Fri. 90 tablet 5   levETIRAcetam (KEPPRA) 250 MG tablet Take 1 tablet (250 mg total) by mouth every Monday, Wednesday, and Friday at 8 PM. 12 tablet 0   levETIRAcetam (KEPPRA) 500 MG tablet Take 1 tablet (500 mg total) by mouth at bedtime. 30 tablet 0   megestrol (MEGACE ES) 625 MG/5ML suspension Take 5 mLs (625 mg total) by mouth daily. 150 mL 1   pantoprazole (PROTONIX) 40 MG tablet Take 1 tablet (40 mg total) by mouth 2 (two) times daily. 180 tablet 1   sertraline (ZOLOFT) 50 MG tablet Take 1 tablet (50 mg total) by mouth daily. 90 tablet 2   Sodium Zirconium Cyclosilicate (LOKELMA PO) Take 1 packet by mouth 4 (four) times a week. Hold on dialysis days     Tiotropium Bromide-Olodaterol (STIOLTO  RESPIMAT) 2.5-2.5 MCG/ACT AERS Inhale 2 puffs by mouth into the lungs daily. 4 g 11   No current facility-administered medications for this visit.    PHYSICAL EXAM There were no vitals filed for this visit.   Constitutional: well appearing. no distress. Appears well nourished.  Neurologic: CN intact. no focal findings. no sensory loss. Psychiatric:  Mood and affect symmetric and appropriate. Eyes:  No icterus. No conjunctival pallor. Ears, nose, throat:  mucous membranes moist. Midline trachea.  Cardiac: regular rate and rhythm.  Respiratory:  unlabored. Abdominal:  soft, non-tender, non-distended.  Peripheral vascular: 1+ DP pulses bilaterally. Feet warm and well perfused.  Left upper extremity brachiocephalic arteriovenous fistula with 2 areas of aneurysmal degeneration and hypopigmentation.  Early ulceration is noted. Extremity: no edema. no cyanosis. no pallor.  Skin: no gangrene. no ulceration.  Lymphatic: no Stemmer's sign. no palpable lymphadenopathy.  PERTINENT LABORATORY AND RADIOLOGIC DATA  Most recent CBC    Latest Ref Rng & Units 02/26/2022    2:08 AM 02/25/2022    4:14 AM 02/24/2022   12:43 PM  CBC  WBC 4.0 - 10.5 K/uL 5.7  5.8  6.3   Hemoglobin 13.0 - 17.0 g/dL 8.3  7.8  7.8   Hematocrit 39.0 - 52.0 % 24.2  23.0  23.7   Platelets 150 - 400 K/uL 249  217  210      Most recent CMP    Latest Ref Rng & Units 02/26/2022    2:08 AM 02/25/2022    4:14 AM 02/24/2022   12:43 PM  CMP  Glucose 70 - 99 mg/dL 99  134  217   BUN 8 - 23 mg/dL 15  29  26    Creatinine 0.61 - 1.24 mg/dL 5.53  8.63  7.23   Sodium 135 - 145 mmol/L 133  132  133   Potassium 3.5 - 5.1 mmol/L 3.7  3.6  3.9   Chloride 98 - 111 mmol/L 93  98  94   CO2 22 - 32 mmol/L 29  27  26    Calcium 8.9 - 10.3 mg/dL 8.9  8.4  8.8     Renal function CrCl cannot be calculated (Patient's most recent lab result is older than the maximum 21 days allowed.).  Hemoglobin A1C (%)  Date Value  01/29/2022 9.0 (A)    Hgb A1c MFr Bld (%)  Date Value  07/28/2021 5.3    LDL Cholesterol  Date Value Ref Range Status  07/10/2021 48 0 - 99 mg/dL Final    Comment:           Total Cholesterol/HDL:CHD Risk Coronary Heart Disease Risk Table                     Men   Women  1/2 Average Risk   3.4   3.3  Average Risk       5.0   4.4  2 X Average Risk   9.6   7.1  3 X Average Risk  23.4   11.0        Use the calculated Patient Ratio above and the CHD Risk Table to determine the patient's CHD Risk.        ATP III CLASSIFICATION (LDL):  <100     mg/dL   Optimal  100-129  mg/dL   Near or Above                    Optimal  130-159  mg/dL   Borderline  160-189  mg/dL   High  >190     mg/dL   Very High Performed at [facility] [address] [place],  [address]      +-------+-----------+-----------+------------+------------+  ABI/TBIToday's ABIToday's TBIPrevious ABIPrevious TBI  +-------+-----------+-----------+------------+------------+  Right  1.12       0.91  1.04        0.85          +-------+-----------+-----------+------------+------------+  Left   1.36       0.84       1.12        0.97          +-------+-----------+-----------+------------+------------+   Yevonne Aline. Stanford Breed, MD [facility] Phone Number: [phone] 03/30/2022 2:32 PM  Total time spent on preparing this encounter including chart review, data review, collecting history, examining the patient, coordinating care for this established patient, 20 minutes.  Portions of this report may have been transcribed using voice recognition software.  Every effort has been made to ensure accuracy; however, inadvertent computerized transcription errors may still be present.

## 2022-03-30 NOTE — Progress Notes (Unsigned)
[facility] [place]  ASSESSMENT / PLAN: Kenneth [name] is a 66 y.o. male with aneurysmal, hypopigmented left upper extremity arteriovenous fistula which needs revision to avoid ulceration and potential rupture.  Plan to do this on a nondialysis day in the near future.  CHIEF COMPLAINT: aneurysmal AVF  HISTORY OF PRESENT ILLNESS: Kenneth [name] is a 66 y.o. male well-known to our service for hemodialysis access.  He had a left brachiocephalic arteriovenous fistula created for him before 2020.  He has had several revisions by my partner Dr. Sherren Mocha Early.  He has had recurrence of aneurysmal formation with hyperpigmentation and early ulceration.  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   type 1 DM   ESRD on hemodialysis ([place])    "MWF; [facility]" (03/30/2018)   Gastroesophageal reflux disease 08/18/2016   Hyperlipidemia    Hypertension    Ischemic rest pain of lower extremity 02/06/2015   Keratoma 02/27/2015   Metatarsal deformity 02/27/2015   PAF (paroxysmal atrial fibrillation) (H[facility])    Pronation deformity of both feet 02/27/2015    Past Surgical History:  Procedure Laterality Date   A/V FISTULAGRAM Left 03/06/2021   Procedure: A/V FISTULAGRAM;  Surgeon: Marty Heck, MD;  Location: [facility];  Service: Cardiovascular;  Laterality: Left;   AV FISTULA PLACEMENT Left 06/22/2016   Procedure: ARTERIOVENOUS (AV) FISTULA CREATION LEFT UPPER ARM;  Surgeon: Rosetta Posner, MD;  Location: [place];  Service: Vascular;  Laterality: Left;   BIOPSY  12/19/2020   Procedure: BIOPSY;  Surgeon: Ladene Artist, MD;  Location: [facility];  Service: Endoscopy;;   CARDIAC CATHETERIZATION N/A  05/31/2016   Procedure: Left Heart Cath and Coronary Angiography;  Surgeon: Jettie Booze, MD;  Location: [facility];  Service: Cardiovascular;  Laterality: N/A;   CARDIAC CATHETERIZATION N/A 05/31/2016   Procedure: Right Heart Cath;  Surgeon: Jettie Booze, MD;  Location: [facility];  Service: Cardiovascular;  Laterality: N/A;   CARDIAC CATHETERIZATION N/A 05/31/2016   Procedure: IABP Insertion;  Surgeon: Jettie Booze, MD;  Location: [facility];  Service: Cardiovascular;  Laterality: N/A;   COLONOSCOPY WITH PROPOFOL N/A 12/20/2020   Procedure: COLONOSCOPY WITH PROPOFOL;  Surgeon: Mauri Pole, MD;  Location: [facility];  Service: Endoscopy;  Laterality: N/A;   CORONARY ARTERY BYPASS GRAFT N/A 06/05/2016   Procedure: CORONARY ARTERY BYPASS GRAFTING (CABG) x3 LIMA to LAD -SVG to OM -SVG to RCA;  Surgeon: Ivin Poot, MD;  Location: [facility];  Service: Open Heart Surgery;  Laterality: N/A;   CORONARY STENT INTERVENTION N/A 07/07/2017   Procedure: CORONARY STENT INTERVENTION;  Surgeon: Wellington Hampshire, MD;  Location: [facility];  Service: Cardiovascular;  Laterality: N/A;   CORONARY STENT INTERVENTION N/A 03/30/2018   Procedure: CORONARY STENT INTERVENTION;  Surgeon: Martinique, Peter M, MD;  Location: [facility];  Service: Cardiovascular;  Laterality: N/A;   ESOPHAGOGASTRODUODENOSCOPY (EGD) WITH PROPOFOL N/A 12/19/2020   Procedure: ESOPHAGOGASTRODUODENOSCOPY (EGD) WITH PROPOFOL;  Surgeon: Ladene Artist, MD;  Location: [facility];  Service: Endoscopy;  Laterality: N/A;   GIVENS CAPSULE STUDY N/A 12/20/2020  Procedure: GIVENS CAPSULE STUDY;  Surgeon: Mauri Pole, MD;  Location: [facility];  Service: Endoscopy;  Laterality: N/A;   INSERTION OF DIALYSIS CATHETER N/A 06/05/2016   Procedure: INSERTION OF DIALYSIS/trialysis CATHETER;  Surgeon: Ivin Poot, MD;  Location: [place];  Service: Vascular;  Laterality: N/A;   INSERTION OF  DIALYSIS CATHETER Right 06/13/2016   Procedure: INSERTION OF DIALYSIS CATHETER RIGHT INTERNAL JUGULAR;  Surgeon: Conrad Heath, MD;  Location: [place];  Service: Vascular;  Laterality: Right;   INSERTION OF DIALYSIS CATHETER Right 09/07/2019   Procedure: Insertion Of Dialysis Catheter;  Surgeon: Rosetta Posner, MD;  [facility];  Service: Vascular;  Laterality: Right;   INTRAOPERATIVE TRANSESOPHAGEAL ECHOCARDIOGRAM N/A 06/05/2016   Procedure: INTRAOPERATIVE TRANSESOPHAGEAL ECHOCARDIOGRAM;  Surgeon: Ivin Poot, MD;  Location: [place] [facility];  Service: Open Heart Surgery;  Laterality: N/A;   LEFT HEART CATH AND CORS/GRAFTS ANGIOGRAPHY N/A 11/03/2016   Procedure: Left Heart Cath and Cors/Grafts Angiography;  Surgeon: Troy Sine, MD;  Location: [facility] [facility];  Service: Cardiovascular;  Laterality: N/A;   LEFT HEART CATH AND CORS/GRAFTS ANGIOGRAPHY N/A 07/07/2017   Procedure: LEFT HEART CATH AND CORS/GRAFTS ANGIOGRAPHY;  Surgeon: Larey Dresser, MD;  Location: [facility];  Service: Cardiovascular;  Laterality: N/A;   LEFT HEART CATH AND CORS/GRAFTS ANGIOGRAPHY N/A 03/30/2018   Procedure: LEFT HEART CATH AND CORS/GRAFTS ANGIOGRAPHY;  Surgeon: Martinique, Peter M, MD;  Location: [place] [facility];  Service: Cardiovascular;  Laterality: N/A;   LEFT HEART CATH AND CORS/GRAFTS ANGIOGRAPHY N/A 04/17/2019   Procedure: LEFT HEART CATH AND CORS/GRAFTS ANGIOGRAPHY;  Surgeon: Larey Dresser, MD;  Location: [facility];  Service: Cardiovascular;  Laterality: N/A;   PERIPHERAL VASCULAR INTERVENTION Left 03/06/2021   Procedure: PERIPHERAL VASCULAR INTERVENTION;  Surgeon: Marty Heck, MD;  Location: [facility] [facility];  Service: Cardiovascular;  Laterality: Left;   POLYPECTOMY  12/20/2020   Procedure: POLYPECTOMY;  Surgeon: Mauri Pole, MD;  Location: [place];  Service: Endoscopy;;   REVISION OF ARTERIOVENOUS GORETEX GRAFT Left 41/32/4401   Procedure: PLICATION OF ANEURYSM OF  ARTERIOVENOUS FISTULA  LEFT ARM;  Surgeon: Rosetta Posner, MD;  Location: [facility];  Service: Vascular;  Laterality: Left;   REVISON OF ARTERIOVENOUS FISTULA Left 09/07/2019   Procedure: REVISON OF LEFT UPPER ARM ARTERIOVENOUS FISTULA;  Surgeon: Rosetta Posner, MD;  Location: [facility];  Service: Vascular;  Laterality: Left;   RIGHT/LEFT HEART CATH AND CORONARY ANGIOGRAPHY N/A 01/03/2021   Procedure: RIGHT/LEFT HEART CATH AND CORONARY ANGIOGRAPHY;  Surgeon: Larey Dresser, MD;  Location: [facility] [facility];  Service: Cardiovascular;  Laterality: N/A;    Family History  Problem Relation Age of Onset   CAD Father    Colon cancer Father    Diabetes Brother     Social History   Socioeconomic History   Marital status: Married    Spouse name: Not on file   Number of children: Not on file   Years of education: Not on file   Highest education level: Not on file  Occupational History   Not on file  Tobacco Use   Smoking status: Former    Packs/day: 1.50    Years: 35.00    Total pack years: 52.50    Types: Cigarettes    Quit date: 2014    Years since quitting: 9.5   Smokeless tobacco: Never   Tobacco comments:    "quit e-cigs ~ 2014"  Vaping  Use   Vaping Use: Former  Substance and Sexual Activity   Alcohol use: Never    Alcohol/week: 0.0 standard drinks of alcohol   Drug use: Never   Sexual activity: Not on file  Other Topics Concern   Not on file  Social History Narrative   Right handed    Social Determinants of Health   Financial Resource Strain: Not on file  Food Insecurity: Not on file  Transportation Needs: Not on file  Physical Activity: Not on file  Stress: Not on file  Social Connections: Not on file  Intimate Partner Violence: Not on file    No Known Allergies  Current Outpatient Medications  Medication Sig Dispense Refill   apixaban (ELIQUIS) 5 MG TABS tablet Take 1 tablet by mouth 2 times daily 60 tablet 11   atorvastatin (LIPITOR) 80 MG tablet Take 1 tablet  (80 mg total) by mouth daily. 90 tablet 3   calcium acetate (PHOSLO) 667 MG capsule Take 667 mg by mouth 3 (three) times daily with meals.      carvedilol (COREG) 6.25 MG tablet Take 1 tablet (6.25 mg total) by mouth 2 (two) times daily with a meal. Hold on the mornings of dialysis. 60 tablet 3   Continuous Blood Gluc Sensor (FREESTYLE LIBRE 2 SENSOR) MISC 1 Device by Does not apply route every 14 (fourteen) days. 6 each 3   diclofenac Sodium (VOLTAREN) 1 % GEL Apply 4 grams topically 4 (four) times daily. 100 g 2   ezetimibe (ZETIA) 10 MG tablet Take 1 tablet by mouth daily 90 tablet 3   glucose blood (ONETOUCH VERIO) test strip 1 each by Other route in the morning, at noon, in the evening, and at bedtime. Use as instructed 400 each 2   hydrALAZINE (APRESOLINE) 100 MG tablet Take 1 tablet (100 mg total) by mouth 3 (three) times daily. DO NOT TAKE AM AND NOON DOSES ON HD DAYS 270 tablet 2   Insulin Lispro-aabc (LYUMJEV) 100 UNIT/ML SOLN Max Daily 30 units per insulin pump 30 mL 6   Insulin Pen Needle (PEN NEEDLES) 32G X 6 MM MISC Use as directed 3 times daily 300 each 1   isosorbide mononitrate (IMDUR) 60 MG 24 hr tablet Take 1&1/2 tablets (90 mg total) by mouth daily. Hold on days of dialysis on Mon, Wed, Fri. 90 tablet 5   levETIRAcetam (KEPPRA) 250 MG tablet Take 1 tablet (250 mg total) by mouth every Monday, Wednesday, and Friday at 8 PM. 12 tablet 0   levETIRAcetam (KEPPRA) 500 MG tablet Take 1 tablet (500 mg total) by mouth at bedtime. 30 tablet 0   megestrol (MEGACE ES) 625 MG/5ML suspension Take 5 mLs (625 mg total) by mouth daily. 150 mL 1   pantoprazole (PROTONIX) 40 MG tablet Take 1 tablet (40 mg total) by mouth 2 (two) times daily. 180 tablet 1   sertraline (ZOLOFT) 50 MG tablet Take 1 tablet (50 mg total) by mouth daily. 90 tablet 2   Sodium Zirconium Cyclosilicate (LOKELMA PO) Take 1 packet by mouth 4 (four) times a week. Hold on dialysis days     Tiotropium Bromide-Olodaterol (STIOLTO  RESPIMAT) 2.5-2.5 MCG/ACT AERS Inhale 2 puffs by mouth into the lungs daily. 4 g 11   No current facility-administered medications for this visit.    PHYSICAL EXAM There were no vitals filed for this visit.   Constitutional: well appearing. no distress. Appears well nourished.  Neurologic: CN intact. no focal findings. no sensory loss. Psychiatric:  Mood and affect symmetric and appropriate. Eyes:  No icterus. No conjunctival pallor. Ears, nose, throat:  mucous membranes moist. Midline trachea.  Cardiac: regular rate and rhythm.  Respiratory:  unlabored. Abdominal:  soft, non-tender, non-distended.  Peripheral vascular: 1+ DP pulses bilaterally. Feet warm and well perfused.  Left upper extremity brachiocephalic arteriovenous fistula with 2 areas of aneurysmal degeneration and hypopigmentation.  Early ulceration is noted. Extremity: no edema. no cyanosis. no pallor.  Skin: no gangrene. no ulceration.  Lymphatic: no Stemmer's sign. no palpable lymphadenopathy.  PERTINENT LABORATORY AND RADIOLOGIC DATA  Most recent CBC    Latest Ref Rng & Units 02/26/2022    2:08 AM 02/25/2022    4:14 AM 02/24/2022   12:43 PM  CBC  WBC 4.0 - 10.5 K/uL 5.7  5.8  6.3   Hemoglobin 13.0 - 17.0 g/dL 8.3  7.8  7.8   Hematocrit 39.0 - 52.0 % 24.2  23.0  23.7   Platelets 150 - 400 K/uL 249  217  210      Most recent CMP    Latest Ref Rng & Units 02/26/2022    2:08 AM 02/25/2022    4:14 AM 02/24/2022   12:43 PM  CMP  Glucose 70 - 99 mg/dL 99  134  217   BUN 8 - 23 mg/dL 15  29  26    Creatinine 0.61 - 1.24 mg/dL 5.53  8.63  7.23   Sodium 135 - 145 mmol/L 133  132  133   Potassium 3.5 - 5.1 mmol/L 3.7  3.6  3.9   Chloride 98 - 111 mmol/L 93  98  94   CO2 22 - 32 mmol/L 29  27  26    Calcium 8.9 - 10.3 mg/dL 8.9  8.4  8.8     Renal function CrCl cannot be calculated (Patient's most recent lab result is older than the maximum 21 days allowed.).  Hemoglobin A1C (%)  Date Value  01/29/2022 9.0 (A)    Hgb A1c MFr Bld (%)  Date Value  07/28/2021 5.3    LDL Cholesterol  Date Value Ref Range Status  07/10/2021 48 0 - 99 mg/dL Final    Comment:           Total Cholesterol/HDL:CHD Risk Coronary Heart Disease Risk Table                     Men   Women  1/2 Average Risk   3.4   3.3  Average Risk       5.0   4.4  2 X Average Risk   9.6   7.1  3 X Average Risk  23.4   11.0        Use the calculated Patient Ratio above and the CHD Risk Table to determine the patient's CHD Risk.        ATP III CLASSIFICATION (LDL):  <100     mg/dL   Optimal  100-129  mg/dL   Near or Above                    Optimal  130-159  mg/dL   Borderline  160-189  mg/dL   High  >190     mg/dL   Very High Performed at [facility] [address] [place], [place] [address]      +-------+-----------+-----------+------------+------------+  ABI/TBIToday's ABIToday's TBIPrevious ABIPrevious TBI  +-------+-----------+-----------+------------+------------+  Right  1.12       0.91  1.04        0.85          +-------+-----------+-----------+------------+------------+  Left   1.36       0.84       1.12        0.97          +-------+-----------+-----------+------------+------------+   Yevonne Aline. Stanford Breed, MD [facility] Phone Number: [phone] 03/30/2022 2:32 PM  Total time spent on preparing this encounter including chart review, data review, collecting history, examining the patient, coordinating care for this established patient, 20 minutes.  Portions of this report may have been transcribed using voice recognition software.  Every effort has been made to ensure accuracy; however, inadvertent computerized transcription errors may still be present.

## 2022-03-31 ENCOUNTER — Other Ambulatory Visit: Payer: Self-pay

## 2022-03-31 ENCOUNTER — Other Ambulatory Visit (HOSPITAL_BASED_OUTPATIENT_CLINIC_OR_DEPARTMENT_OTHER): Payer: Self-pay

## 2022-03-31 ENCOUNTER — Encounter: Payer: Self-pay | Admitting: Family Medicine

## 2022-03-31 ENCOUNTER — Encounter: Payer: Self-pay | Admitting: Vascular Surgery

## 2022-03-31 ENCOUNTER — Ambulatory Visit (INDEPENDENT_AMBULATORY_CARE_PROVIDER_SITE_OTHER): Payer: Medicare Other | Admitting: Vascular Surgery

## 2022-03-31 ENCOUNTER — Telehealth: Payer: Self-pay

## 2022-03-31 ENCOUNTER — Ambulatory Visit (INDEPENDENT_AMBULATORY_CARE_PROVIDER_SITE_OTHER): Payer: Medicare Other | Admitting: Family Medicine

## 2022-03-31 VITALS — BP 121/61 | HR 57 | Temp 98.3°F | Resp 20 | Ht 65.0 in | Wt 127.9 lb

## 2022-03-31 VITALS — BP 106/68 | HR 69 | Temp 97.8°F | Ht 65.0 in | Wt 129.0 lb

## 2022-03-31 DIAGNOSIS — Z992 Dependence on renal dialysis: Secondary | ICD-10-CM

## 2022-03-31 DIAGNOSIS — N186 End stage renal disease: Secondary | ICD-10-CM

## 2022-03-31 DIAGNOSIS — Z01818 Encounter for other preprocedural examination: Secondary | ICD-10-CM | POA: Diagnosis not present

## 2022-03-31 DIAGNOSIS — J439 Emphysema, unspecified: Secondary | ICD-10-CM

## 2022-03-31 LAB — CBC
HCT: 37.9 % — ABNORMAL LOW (ref 39.0–52.0)
Hemoglobin: 12.5 g/dL — ABNORMAL LOW (ref 13.0–17.0)
MCHC: 33.1 g/dL (ref 30.0–36.0)
MCV: 96.1 fl (ref 78.0–100.0)
Platelets: 109 10*3/uL — ABNORMAL LOW (ref 150.0–400.0)
RBC: 3.94 Mil/uL — ABNORMAL LOW (ref 4.22–5.81)
RDW: 18.2 % — ABNORMAL HIGH (ref 11.5–15.5)
WBC: 3.6 10*3/uL — ABNORMAL LOW (ref 4.0–10.5)

## 2022-03-31 LAB — BASIC METABOLIC PANEL
BUN: 43 mg/dL — ABNORMAL HIGH (ref 6–23)
CO2: 30 mEq/L (ref 19–32)
Calcium: 8.5 mg/dL (ref 8.4–10.5)
Chloride: 88 mEq/L — ABNORMAL LOW (ref 96–112)
Creatinine, Ser: 6.86 mg/dL (ref 0.40–1.50)
GFR: 7.87 mL/min — CL (ref >60.00)
Glucose, Bld: 316 mg/dL — ABNORMAL HIGH (ref 70–99)
Potassium: 4.6 mEq/L (ref 3.5–5.1)
Sodium: 129 mEq/L — ABNORMAL LOW (ref 135–145)

## 2022-03-31 NOTE — Progress Notes (Signed)
Subjective:   Chief Complaint  Patient presents with   Medical Clearance    Kenneth [name]  is here for a Pre-operative physical at the request of Dr. Annette Stable.   He  is having L3-4 laminectomy surgery. It is not scheduled yet. Here w wife and friend who help interpret ([place]).   Personal or family hx of adverse outcome to anesthesia? No  Chipped, cracked, missing, or loose teeth? No  Decreased ROM of neck? No  Able to walk up 2 flights of stairs without becoming significantly short of breath or having chest pain? Yes   Revised Goldman Criteria: High Risk Surgery (intraperitoneal, intrathoracic, aortic): No  Ischemic heart disease (Prior MI, +excercise stress test, angina, nitrate use, Qwave): No  History of heart failure: Yes  History of cerebrovascular disease: No  History of diabetes: Yes  Insulin therapy for DM: Yes  Preoperative Cr >2.0: Yes   Patient Active Problem List   Diagnosis Date Noted   Pulmonary emphysema ([place]) 02/21/2022   Anemia of chronic disease 02/21/2022   Type 2 diabetes mellitus with hyperglycemia, with long-term current use of insulin ([place]) 10/28/2021   Type 2 diabetes mellitus with diabetic polyneuropathy, with long-term current use of insulin ([place]) 10/28/2021   Hypertension associated with diabetes ([place]) 09/16/2021   Chronic bilateral low back pain with bilateral sciatica 09/16/2021   Partial seizure disorder ([place])    Acute respiratory failure with hypoxia ([place]) 07/28/2021   Prolonged QT interval 01/01/2021   Hemoptysis 01/01/2021   Occult blood in stools    Symptomatic bradycardia 12/15/2020   Postural dizziness with presyncope 12/15/2020   Blood in stool 47/05/6282   Acute metabolic encephalopathy 66/29/4765   Altered mental status 08/27/2019   Unresponsive episode 08/27/2019   AF (paroxysmal atrial fibrillation) ([place]) 08/27/2019   Status post coronary artery stent placement    NSTEMI (non-ST elevated myocardial infarction) ([place]) 03/30/2018   Hypertensive  heart disease 09/12/2017   Hyperlipidemia associated with type 2 diabetes mellitus ([place]) 09/12/2017   Hyperkalemia 07/20/2017   Chronic diastolic CHF (congestive heart failure) ([place]) 04/14/2017   Hypertensive emergency 04/14/2017   Anemia, chronic renal failure 04/11/2017   Fluid overload 04/11/2017   Diabetic foot ulcer ([place]) 04/11/2017   Cellulitis in diabetic foot ([place]) 01/18/2017   Chest pain 12/16/2016   Hypertensive urgency 12/16/2016   Pressure injury of skin 11/03/2016   Type 2 diabetes mellitus with chronic kidney disease on chronic dialysis, with long-term current use of insulin ([place]) 10/31/2016   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 ([name]) 10/30/2016   Atherosclerotic heart disease of native coronary artery with other forms of angina pectoris ([place])    Gastroesophageal reflux disease 08/18/2016   HCAP (healthcare-associated pneumonia)    Non-ST elevation (NSTEMI) myocardial infarction ([place])    Pronation deformity of both feet 02/27/2015   Metatarsal deformity 02/27/2015   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   type 1 DM   ESRD on hemodialysis ([place])    "MWF; [facility]" (03/30/2018)   Gastroesophageal reflux disease 08/18/2016   Hyperlipidemia    Hypertension    Ischemic rest  pain of lower extremity 02/06/2015   Keratoma 02/27/2015   Metatarsal deformity 02/27/2015   PAF (paroxysmal atrial fibrillation) (HCC)    Pronation deformity of both feet 02/27/2015    Past Surgical History:  Procedure Laterality Date   A/V FISTULAGRAM Left 03/06/2021   Procedure: A/V FISTULAGRAM;  Surgeon: Marty Heck, MD;  Location: [facility];  Service: Cardiovascular;  Laterality: Left;   AV FISTULA PLACEMENT Left 06/22/2016   Procedure: ARTERIOVENOUS (AV) FISTULA CREATION LEFT UPPER ARM;  Surgeon: Rosetta Posner, MD;  Location: [facility];  Service: Vascular;  Laterality: Left;   BIOPSY  12/19/2020   Procedure: BIOPSY;  Surgeon: Ladene Artist, MD;  Location: [place];  Service: Endoscopy;;   CARDIAC CATHETERIZATION N/A 05/31/2016   Procedure: Left Heart Cath and Coronary Angiography;  Surgeon: Jettie Booze, MD;  Location: [facility];  Service: Cardiovascular;  Laterality: N/A;   CARDIAC CATHETERIZATION N/A 05/31/2016   Procedure: Right Heart Cath;  Surgeon: Jettie Booze, MD;  Location: [facility];  Service: Cardiovascular;  Laterality: N/A;   CARDIAC CATHETERIZATION N/A 05/31/2016   Procedure: IABP Insertion;  Surgeon: Jettie Booze, MD;  Location: [facility];  Service: Cardiovascular;  Laterality: N/A;   COLONOSCOPY WITH PROPOFOL N/A 12/20/2020   Procedure: COLONOSCOPY WITH PROPOFOL;  Surgeon: Mauri Pole, MD;  Location: [facility];  Service: Endoscopy;  Laterality: N/A;   CORONARY ARTERY BYPASS GRAFT N/A 06/05/2016   Procedure: CORONARY ARTERY BYPASS GRAFTING (CABG) x3 LIMA to LAD -SVG to OM -SVG to RCA;  Surgeon: Ivin Poot, MD;  Location: [place];  Service: Open Heart Surgery;  Laterality: N/A;   CORONARY STENT INTERVENTION N/A 07/07/2017   Procedure: CORONARY STENT INTERVENTION;  Surgeon: Wellington Hampshire, MD;  Location: [place];  Service: Cardiovascular;  Laterality: N/A;   CORONARY STENT INTERVENTION N/A 03/30/2018   Procedure: CORONARY STENT INTERVENTION;  Surgeon: Martinique, Peter M, MD;  Location: [facility];  Service: Cardiovascular;  Laterality: N/A;   ESOPHAGOGASTRODUODENOSCOPY (EGD) WITH PROPOFOL N/A 12/19/2020   Procedure: ESOPHAGOGASTRODUODENOSCOPY (EGD) WITH PROPOFOL;  Surgeon: Ladene Artist, MD;  Location[facility];  Service: Endoscopy;  Laterality: N/A;   GIVENS CAPSULE STUDY N/A 12/20/2020   Procedure: GIVENS CAPSULE STUDY;  Surgeon: Mauri Pole, MD;  Location: [place];  Service: Endoscopy;  Laterality: N/A;   INSERTION OF DIALYSIS CATHETER N/A 06/05/2016   Procedure: INSERTION OF DIALYSIS/trialysis CATHETER;  Surgeon: Ivin Poot, MD;  Location: [facility];  Service: Vascular;  Laterality: N/A;   INSERTION OF DIALYSIS CATHETER Right 06/13/2016   Procedure: INSERTION OF DIALYSIS CATHETER RIGHT INTERNAL JUGULAR;  Surgeon: Conrad Heavener, MD;  Location: [facility];  Service: Vascular;  Laterality: Right;   INSERTION OF DIALYSIS CATHETER Right 09/07/2019   Procedure: Insertion Of Dialysis Catheter;  Surgeon: Rosetta Posner, MD;  Location: [facility];  Service: Vascular;  Laterality: Right;   INTRAOPERATIVE TRANSESOPHAGEAL ECHOCARDIOGRAM N/A 06/05/2016   Procedure: INTRAOPERATIVE TRANSESOPHAGEAL ECHOCARDIOGRAM;  Surgeon: Ivin Poot, MD;  Location: [place];  Service: Open Heart Surgery;  Laterality: N/A;   LEFT HEART CATH AND CORS/GRAFTS ANGIOGRAPHY N/A 11/03/2016   Procedure: Left Heart Cath and Cors/Grafts Angiography;  Surgeon: Troy Sine, MD;  Location: [place] [facility];  Service: Cardiovascular;  Laterality: N/A;   LEFT HEART CATH AND CORS/GRAFTS ANGIOGRAPHY N/A 07/07/2017   Procedure: LEFT HEART CATH AND CORS/GRAFTS ANGIOGRAPHY;  Surgeon: Loralie Champagne  S, MD;  Location: [place] [facility];  Service: Cardiovascular;  Laterality: N/A;   LEFT HEART CATH AND CORS/GRAFTS ANGIOGRAPHY N/A 03/30/2018   Procedure: LEFT HEART CATH AND CORS/GRAFTS ANGIOGRAPHY;  Surgeon: Martinique, Peter M, MD;  Location: [place] [facility];  Service: Cardiovascular;  Laterality: N/A;   LEFT HEART CATH AND CORS/GRAFTS ANGIOGRAPHY N/A 04/17/2019   Procedure: LEFT HEART CATH AND CORS/GRAFTS ANGIOGRAPHY;  Surgeon: Larey Dresser, MD;  Location: [place] [facility];  Service: Cardiovascular;  Laterality: N/A;   PERIPHERAL  VASCULAR INTERVENTION Left 03/06/2021   Procedure: PERIPHERAL VASCULAR INTERVENTION;  Surgeon: Marty Heck, MD;  Location: [facility];  Service: Cardiovascular;  Laterality: Left;   POLYPECTOMY  12/20/2020   Procedure: POLYPECTOMY;  Surgeon: Mauri Pole, MD;  Location: [facility];  Service: Endoscopy;;   REVISION OF ARTERIOVENOUS GORETEX GRAFT Left 42/35/3614   Procedure: PLICATION OF ANEURYSM OF ARTERIOVENOUS FISTULA  LEFT ARM;  Surgeon: Rosetta Posner, MD;  Location: [facility];  Service: Vascular;  Laterality: Left;   REVISON OF ARTERIOVENOUS FISTULA Left 09/07/2019   Procedure: REVISON OF LEFT UPPER ARM ARTERIOVENOUS FISTULA;  Surgeon: Rosetta Posner, MD;  Location: [facility];  Service: Vascular;  Laterality: Left;   RIGHT/LEFT HEART CATH AND CORONARY ANGIOGRAPHY N/A 01/03/2021   Procedure: RIGHT/LEFT HEART CATH AND CORONARY ANGIOGRAPHY;  Surgeon: Larey Dresser, MD;  Location: [facility];  Service: Cardiovascular;  Laterality: N/A;    Current Outpatient Medications  Medication Sig Dispense Refill   apixaban (ELIQUIS) 5 MG TABS tablet Take 1 tablet by mouth 2 times daily 60 tablet 11   atorvastatin (LIPITOR) 80 MG tablet Take 1 tablet (80 mg total) by mouth daily. 90 tablet 3   calcium acetate (PHOSLO) 667 MG capsule Take 667 mg by mouth 3 (three) times daily with meals.      carvedilol (COREG) 6.25 MG tablet Take 1 tablet (6.25 mg total) by mouth 2 (two) times daily with a meal. Hold on the mornings of dialysis. 60 tablet 3   Continuous Blood Gluc Sensor (FREESTYLE LIBRE 2 SENSOR) MISC 1 Device by Does not apply route every 14 (fourteen) days. 6 each 3   diclofenac Sodium (VOLTAREN) 1 % GEL Apply 4 grams topically 4 (four) times daily. 100 g 2   ezetimibe (ZETIA) 10 MG tablet Take 1 tablet by mouth daily 90 tablet 3   glucose blood (ONETOUCH VERIO) test strip 1 each by Other route in the morning, at noon, in the evening, and at bedtime. Use as instructed 400 each 2    hydrALAZINE (APRESOLINE) 100 MG tablet Take 1 tablet (100 mg total) by mouth 3 (three) times daily. DO NOT TAKE AM AND NOON DOSES ON HD DAYS 270 tablet 2   Insulin Lispro-aabc (LYUMJEV) 100 UNIT/ML SOLN Max Daily 30 units per insulin pump 30 mL 6   Insulin Pen Needle (PEN NEEDLES) 32G X 6 MM MISC Use as directed 3 times daily 300 each 1   isosorbide mononitrate (IMDUR) 60 MG 24 hr tablet Take 1&1/2 tablets (90 mg total) by mouth daily. Hold on days of dialysis on Mon, Wed, Fri. 90 tablet 5   levETIRAcetam (KEPPRA) 250 MG tablet Take 1 tablet (250 mg total) by mouth every Monday, Wednesday, and Friday at 8 PM. 12 tablet 0   levETIRAcetam (KEPPRA) 500 MG tablet Take 1 tablet (500 mg total) by mouth at bedtime. 30 tablet 0   megestrol (MEGACE ES) 625 MG/5ML suspension Take  5 mLs (625 mg total) by mouth daily. 150 mL 1   pantoprazole (PROTONIX) 40 MG tablet Take 1 tablet (40 mg total) by mouth 2 (two) times daily. 180 tablet 1   sertraline (ZOLOFT) 50 MG tablet Take 1 tablet (50 mg total) by mouth daily. 90 tablet 2   Sodium Zirconium Cyclosilicate (LOKELMA PO) Take 1 packet by mouth 4 (four) times a week. Hold on dialysis days     Tiotropium Bromide-Olodaterol (STIOLTO RESPIMAT) 2.5-2.5 MCG/ACT AERS Inhale 2 puffs by mouth into the lungs daily. 4 g 11   No Known Allergies  Family History  Problem Relation Age of Onset   CAD Father    Colon cancer Father    Diabetes Brother      Review of Systems:  Constitutional:  no fevers Eye:  no recent significant change in vision Ear:  no hearing loss Nose/Mouth/Throat:  No dental complaints Neck/Thyroid:  no lumps or masses Pulmonary:  No shortness of breath, +coughing Cardiovascular:  no chest pain Gastrointestinal:  no abdominal pain GU:  negative for dysuria Musculoskeletal/Extremities:  +back pain Skin/Integumentary ROS:  no abnormal skin lesions reported Neurologic:  no HA   Objective:   Vitals:   03/31/22 1039  BP: 106/68  Pulse: 69   Temp: 97.8 F (36.6 C)  TempSrc: Oral  SpO2: 93%  Weight: 129 lb (58.5 kg)  Height: 5\' 5"  (1.651 m)   Body mass index is 21.47 kg/m.  General:  well developed, well nourished, in no apparent distress Skin:  warm, no pallor or diaphoresis Head:  normocephalic, atraumatic Eyes:  pupils equal and round, sclera anicteric without injection Ears:  canals without lesions, TMs shiny without retraction, no obvious effusion, no erythema Throat/Pharynx:  lips and gingiva without lesion; tongue and uvula midline; non-inflamed pharynx; no exudates or postnasal drainage Neck: neck supple without adenopathy, thyromegaly, or masses, no bruits, no jugular venous distention Lungs:  clear to auscultation, breath sounds equal bilaterally, no respiratory distress Cardio:  regular rate and rhythm without murmurs Abdomen:  abdomen soft, nontender; bowel sounds normal; no masses, hepatomegaly or splenomegaly Musculoskeletal:  symmetrical muscle groups noted without atrophy or deformity Extremities:  no clubbing, cyanosis, or edema, no deformities, no skin discoloration Neuro:  gait slow; no cerebellar signs, CN2-12 grossly intact Psych: Age appropriate judgment and insight; normal mood   Assessment:   Pre-op exam - Plan: CBC, Basic metabolic panel  Pulmonary emphysema, unspecified emphysema type (HCC)   Plan:   Ck labs. Hx of ESRD on HD, DM on insulin, emphysema, CHF/heart disease. 5.4% risk of cardiac event based on revised Goldman's. He is medically optimized at this time. Hold Eliquis 2 d prior to procedure.   The patient, spouse and friend voiced understanding and agreement to the plan.  [place], DO 03/31/22  12:13 PM

## 2022-03-31 NOTE — Telephone Encounter (Signed)
CRITICAL VALUE STICKER  CRITICAL VALUE: Creatinine 6.86, GFR 7.87  RECEIVER (on-site recipient of call): Hannalee Castor, RMA  DATE & TIME NOTIFIED:03/31/2022 @ 4:56 pm   MESSENGER (representative from lab): Daryel Gerald   MD NOTIFIED: Riki Sheer, DO   TIME OF NOTIFICATION: 5:38 pm  RESPONSE:

## 2022-03-31 NOTE — Patient Instructions (Addendum)
You are medically optimized for surgery.   You will stop Eliquis 2 days prior to your procedure.   Make sure to take the inhaler.  Give [place] 2-3 business days to get the results of your labs back.   Let us know if you need anything.

## 2022-04-01 ENCOUNTER — Other Ambulatory Visit (HOSPITAL_BASED_OUTPATIENT_CLINIC_OR_DEPARTMENT_OTHER): Payer: Self-pay

## 2022-04-01 NOTE — Telephone Encounter (Signed)
Noted. ESRD on HD on MWF near baseline.

## 2022-04-02 ENCOUNTER — Ambulatory Visit (INDEPENDENT_AMBULATORY_CARE_PROVIDER_SITE_OTHER): Payer: Medicare Other | Admitting: Family Medicine

## 2022-04-02 ENCOUNTER — Encounter: Payer: Self-pay | Admitting: Family Medicine

## 2022-04-02 ENCOUNTER — Encounter: Payer: Self-pay | Admitting: Podiatry

## 2022-04-02 ENCOUNTER — Ambulatory Visit (INDEPENDENT_AMBULATORY_CARE_PROVIDER_SITE_OTHER): Payer: Medicare Other | Admitting: Podiatry

## 2022-04-02 VITALS — BP 135/54 | HR 62 | Temp 98.3°F | Resp 16 | Ht 65.0 in | Wt 127.0 lb

## 2022-04-02 DIAGNOSIS — E1140 Type 2 diabetes mellitus with diabetic neuropathy, unspecified: Secondary | ICD-10-CM

## 2022-04-02 DIAGNOSIS — E1149 Type 2 diabetes mellitus with other diabetic neurological complication: Secondary | ICD-10-CM | POA: Diagnosis not present

## 2022-04-02 DIAGNOSIS — L97421 Non-pressure chronic ulcer of left heel and midfoot limited to breakdown of skin: Secondary | ICD-10-CM

## 2022-04-02 DIAGNOSIS — N186 End stage renal disease: Secondary | ICD-10-CM | POA: Diagnosis not present

## 2022-04-02 DIAGNOSIS — J439 Emphysema, unspecified: Secondary | ICD-10-CM | POA: Diagnosis not present

## 2022-04-02 DIAGNOSIS — Z992 Dependence on renal dialysis: Secondary | ICD-10-CM | POA: Diagnosis not present

## 2022-04-02 DIAGNOSIS — I5032 Chronic diastolic (congestive) heart failure: Secondary | ICD-10-CM

## 2022-04-02 DIAGNOSIS — L84 Corns and callosities: Secondary | ICD-10-CM

## 2022-04-02 NOTE — Progress Notes (Signed)
Chief Complaint  Patient presents with   Follow-up    Here for follow up prior to back surgery    Subjective: Patient is a 66 y.o. male here for f/u. Here w spouse and friend who helps interpret ([place]).   Patient is here for completion of a form.  He has a fistula revision next week and a lumbar spine procedure coming up as well.  His wife does not believe she will be able to care for him and is requesting me to fill out a form for a home health agency to provide care.  The patient has a history of ESRD on dialysis, emphysema, CHF with preserved ejection fraction, and chronic low back pain.  His breathing is becoming more labored and he has a cough.  Unfortunately he has not been fully compliant with his inhalers from the pulmonology team.  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   type 1 DM   ESRD on hemodialysis ([place])    "MWF; [facility]" (03/30/2018)   Gastroesophageal reflux disease 08/18/2016   Hyperlipidemia    Hypertension    Ischemic rest pain of lower extremity 02/06/2015   Keratoma 02/27/2015   Metatarsal deformity 02/27/2015   PAF (paroxysmal atrial fibrillation) (HCC)    Pronation deformity of both feet 02/27/2015    Objective: BP (!) 135/54 (BP Location: Right Arm, Patient Position: Sitting, Cuff Size: Small)   Pulse 62   Temp 98.3 F (36.8 C) (Oral)   Resp 16   Ht 5\' 5"  (1.651 m)   Wt 127 lb (57.6 kg)   SpO2 98%   BMI 21.13 kg/m  General: Awake, appears stated age Skin: Ecchymosis over lateral forefoot on R.  Lungs: No accessory muscle use Psych: Age appropriate judgment and insight, normal affect and mood  Assessment and Plan: Pulmonary emphysema, unspecified emphysema type  (HCC)  Chronic diastolic CHF (congestive heart failure) (HCC)  ESRD (end stage renal disease) on dialysis [facility])  Given the chronic illness issues of pt and planned procedures, he will need more close monitoring. Will fill out form for below: [facility] [place] [facility] and [facility] Beatty-coordinator [address], [place], [place] [address] [place] It was faxed today.  The patient was given a copy of the completed form as well. The patient, his spouse and friend voiced understanding and agreement to the plan.  I spent 30 min w the patient and family/friend discussing the above plan and filling out a form on the same day of the visit.   [place], DO 04/02/22  12:12 PM

## 2022-04-02 NOTE — Progress Notes (Signed)
Subjective:   Patient ID: Kenneth [name], male   DOB: 66 y.o.   MRN: [id number]   HPI Patient states he worked on the right foot himself and developed a break in skin and is worried about ulcer and its been going on for couple weeks and the right one has keratotic lesion and he cannot take care of himself with long-term diabetes   ROS      Objective:  Physical Exam  Patient has had long-term diabetes with neuropathic-like condition who did work on the left foot himself against our wishes and developed a localized ulceration that is just in skin with crusted tissue appears to be healing no proximal edema erythema drainage noted with keratotic lesion right fifth metatarsal     Assessment:  Low-grade ulceration of the left fifth metatarsal secondary to self treatment with keratotic lesion formation right with patient he does have diabetic neuropathy     Plan:  Reviewed both conditions and for the left we will continue soaks padding and it should crust over heal uneventfully but if any redness drainage or other pathology were to occur he is to let us know immediately and his wife was instructed to the same and I did sharp sterile debridement of the right no angiogenic bleeding reappoint for routine care

## 2022-04-02 NOTE — Patient Instructions (Signed)
We will fax the form today. Let us know if you need anything.

## 2022-04-07 ENCOUNTER — Telehealth: Payer: Self-pay

## 2022-04-07 ENCOUNTER — Other Ambulatory Visit (HOSPITAL_BASED_OUTPATIENT_CLINIC_OR_DEPARTMENT_OTHER): Payer: Self-pay

## 2022-04-07 NOTE — Telephone Encounter (Signed)
[facility] sent FL-2 forms back over , forms are missing info ... New form sent as well placed in Robin's basket

## 2022-04-08 ENCOUNTER — Encounter (HOSPITAL_COMMUNITY): Payer: Self-pay | Admitting: Vascular Surgery

## 2022-04-08 ENCOUNTER — Other Ambulatory Visit: Payer: Self-pay

## 2022-04-08 NOTE — Progress Notes (Signed)
Interview done with Interpreter Ludger Nutting- [phone], and the wife [name], as the pt was too tired from his dialysis session.  PCP - Dr. Deloris Ping  Cardiologist - Dr. Aundra Dubin  EP- Denies  Endocrine- Denies  Pulm- Denies  Chest x-ray - 02/24/22 (E)  EKG - 03/10/22 (E)  Stress Test - 11/15/2018 (E)  ECHO - 07/24/21 (E)  Cardiac Cath - 01/03/21 (E)  AICD-na PM-na LOOP-na  Nerve Stimulator- Denies  Dialysis- M-W-F, per [name], the pt had a full session today.  Sleep Study - Denies CPAP - Denies  LABS- 04/09/22: I-Stat 8, PCR  ASA- Denies ELIQUIS- LD- 7/23  ERAS- No  HA1C- 01/29/22(E): 9.0 Fasting Blood Sugar - unknown, pt sleeping Checks Blood Sugar __5___ times a day, [name] states the pt wears a continuous meter. Meter currently on the left arm. [name] asked to have pt remove prior to surgery.  Anesthesia- Yes- medical/cardiac history and A1C- notified  [name] denies the pt having chest pain, sob, or fever during the pre-op phone call. All instructions explained to [place], with a verbal understanding of the material including: as of today, stop taking all Aspirin (unless instructed by your doctor) and Other Aspirin containing products, Vitamins, Fish oils, and Herbal medications. Also stop all NSAIDS i.e. Advil, Ibuprofen, Motrin, Aleve, Anaprox, Naproxen, BC, Goody Powders, and all Supplements.   How do I manage my blood sugar before surgery? Check your blood sugar the morning of your surgery when you wake up and every 2 hours until you get to the Short Stay unit. If your blood sugar is less than 70 mg/dL, you will need to treat for low blood sugar: Do not take insulin. Treat a low blood sugar (less than 70 mg/dL) with  cup of clear juice (cranberry or apple), 4 glucose tablets, OR glucose gel. Recheck blood sugar in 15 minutes after treatment (to make sure it is greater than 70 mg/dL). If your blood sugar is not greater than 70 mg/dL on recheck, call [phone]  for  further instructions. Report your blood sugar to the short stay nurse when you get to Short Stay.  Reviewed and Endorsed by [facility] Patient Education Committee, August 2015  [name] also instructed for the pt to wear a mask and social distance if he goes out. The opportunity to ask questions was provided.

## 2022-04-08 NOTE — Anesthesia Preprocedure Evaluation (Addendum)
Anesthesia Evaluation  Patient identified by MRN, date of birth, ID band Patient awake    Reviewed: Allergy & Precautions, NPO status , Patient's Chart, lab work & pertinent test results  Airway Mallampati: II  TM Distance: >3 FB     Dental   Pulmonary pneumonia, COPD, former smoker,    breath sounds clear to auscultation       Cardiovascular hypertension, + angina + CAD, + Past MI, + Peripheral Vascular Disease and +CHF   Rhythm:Regular Rate:Normal     Neuro/Psych Seizures -,  PSYCHIATRIC DISORDERS  Neuromuscular disease    GI/Hepatic Neg liver ROS, GERD  ,  Endo/Other  diabetes  Renal/GU Renal disease     Musculoskeletal   Abdominal   Peds  Hematology   Anesthesia Other Findings   Reproductive/Obstetrics                           Anesthesia Physical Anesthesia Plan  ASA: 4  Anesthesia Plan: MAC   Post-op Pain Management:    Induction: Intravenous  PONV Risk Score and Plan: Ondansetron, Treatment may vary due to age or medical condition and Dexamethasone  Airway Management Planned: LMA  Additional Equipment:   Intra-op Plan:   Post-operative Plan:   Informed Consent: I have reviewed the patients History and Physical, chart, labs and discussed the procedure including the risks, benefits and alternatives for the proposed anesthesia with the patient or authorized representative who has indicated his/her understanding and acceptance.     Dental advisory given  Plan Discussed with: CRNA and Anesthesiologist  Anesthesia Plan Comments: (PAT note by Karoline Caldwell, PA-C: Complex cardiac history. CAD: s/p CABG. NSTEMI 10/18, cath with 95% ostial SVG-high OM1 stenosis with TIMI 2 flow in graft, s/p DES to SVG-OM1 07/07/17. NSTEMI 7/19 with in-stent restenosis in the SVG-ramus, had DES x 2 to native high OM1. He has had chronic atypical chest pain episodes, Cardiolite in 3/20 showed no  ischemia. He had repeat cath in 8/20 showing occluded SVG-ramus (new) but continued patency of the native high OM1 s/p stenting. No target for intervention. LHC again in 4/22, no change from 8/20. No intervention. Chronic diastolic CHF: EF low in past but improved after CABG. Echo 4/22 with EF 55-60%. Echo 11/22 EF 60-65%. NYHA class II-early IIIsymptoms, limited by fatigue and general deconditioning.  Paroxysmal A-fib maintained on Eliquis.  Last seen by cardiology Allena Katz, [place] 03/10/2022.  Stable at that time from cardiac standpoint, no changes made, recommended 86-monthfollow-up.  History of likely seizures.  Admitted 11/22 after he became unresponsive at HD w/ ? Seizure like activity. K >7.5(Lokelma had been previously stopped). Given Lokelma 10 g + calcium gluconate + insulin.Also w/ fluid overload and given IV Lasix.Nephrology consulted andstartedurgent HD. HF consulted and recommended dry weight be lowered with NYHA III symptoms. Losartan stopped. 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   Follows with pulmonology for history of DOE, CT chest with moderate emphysema, PFTs normal.  Symptoms felt to be multifactorial, deconditioning, intermittent volume overload between dialysis sessions.He is currently maintained on Stiolto inhaler.  ESRD on HD Monday Wednesday Friday.  IDDM, on insulin pump.  Patient will need day of surgery labs and evaluation.  EKG 02/24/2022: Sinus rhythm.  Rate 63.  Probable LAE.  Right bundle branch block.  Inferior infarct, age undetermined.  Carotid duplex 03/12/2022: Summary:  Right Carotid: Velocities in the right ICA are consistent with a 1-39% stenosis. Non-hemodynamically significant  plaque <50% noted in the CCA. The ECA appears >50% stenosed.  Left Carotid: Velocities in the left ICA are consistent with a 1-39% stenosis. Non-hemodynamically significant plaque <50% noted in the CCA.  Vertebrals: Left vertebral artery  demonstrates antegrade flow. Right vertebral artery demonstrates high resistant flow. Dampened, high resistant waveforms in the right vertebral artery.  Subclavians: Right subclavian artery was stenotic. Right subclavian artery flow was disturbed. Elevated velocities and low resistant waveforms in the left subclavian artery secondary to left arm AVF.   TTE 07/24/2021: 1. Hypokinesis of the basal inferior wall with overall preserved LV  systolic function.  2. Left ventricular ejection fraction, by estimation, is 60 to 65%. The  left ventricle has normal function. The left ventricle demonstrates  regional wall motion abnormalities (see scoring diagram/findings for  description). Left ventricular diastolic  parameters are consistent with Grade II diastolic dysfunction  (pseudonormalization). Elevated left atrial pressure.  3. Right ventricular systolic function is normal. The right ventricular  size is normal. Tricuspid regurgitation signal is inadequate for assessing  PA pressure.  4. Left atrial size was mildly dilated.  5. The mitral valve is normal in structure. Trivial mitral valve  regurgitation. No evidence of mitral stenosis.  6. The aortic valve is tricuspid. Aortic valve regurgitation is trivial.  Mild aortic valve sclerosis is present, with no evidence of aortic valve  stenosis.  7. The inferior vena cava is normal in size with greater than 50%  respiratory variability, suggesting right atrial pressure of 3 mmHg.   Cardiac cath 04/17/19: LeftAnteriorDescending: 90% proximal LAD stenosis, subtotal occlusion of the mid LAD. LIMA-LAD is patent. RamusIntermedius: Large ramus with a long stented segment in the proximal to mid vessel. 40 to 50% in-stent restenosis in the distal stented segment. Totally occluded SVG-ramus. LeftCircumflex: The AV LCx is small in caliber, 80% ostial stenosis. RightCoronaryArtery:Occluded proximal RCA. The SVG-RCA is known to be occluded  from prior cathsand was not injected. There were extensive left to right collaterals. CONCLUSION: 1. Occluded SVG-RCA and SVG-ramus, patent LIMA-LAD.  2. The large ramus was patent with 40-50% mid ramus stenosis (in-stent restenosis).  3. 80% ostial stenosis very small caliber AV LCx. Not interventional target.  4. Occluded RCA (known from prior) with collaterals. -No target for intervention. Aggressive medical management of angina.  )     Anesthesia Quick Evaluation

## 2022-04-08 NOTE — Progress Notes (Signed)
Anesthesia Chart Review: Same-day work-up  Complex cardiac history. CAD: s/p CABG.  NSTEMI 10/18, cath with 95% ostial SVG-high OM1 stenosis with TIMI 2 flow in graft, s/p DES to SVG-OM1 07/07/17. NSTEMI 7/19 with in-stent restenosis in the SVG-ramus, had DES x 2 to native high OM1.   He has had chronic atypical chest pain episodes, Cardiolite in 3/20 showed no ischemia.  He had repeat cath in 8/20 showing occluded SVG-ramus (new) but continued patency of the native high OM1 s/p stenting.  No target for intervention.  LHC again in 4/22, no change from 8/20.  No intervention. Chronic diastolic CHF: EF low in past but improved after CABG. Echo 4/22 with EF 55-60%. Echo 11/22 EF 60-65%.  NYHA class II-early III symptoms, limited by fatigue and general deconditioning.  Paroxysmal A-fib maintained on Eliquis.  Last seen by cardiology Allena Katz, [place] 03/10/2022.  Stable at that time from cardiac standpoint, no changes made, recommended 30-month follow-up.  History of likely seizures.  Admitted 11/22 after he became unresponsive at HD w/ ? Seizure like activity. K >7.5 (Lokelma had been previously stopped). Given Lokelma 10 g + calcium gluconate + insulin. Also w/ fluid overload and given IV Lasix. Nephrology consulted and started urgent HD. HF consulted and recommended dry weight be lowered with NYHA III symptoms. Losartan stopped. 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   Follows with pulmonology for history of DOE, CT chest with moderate emphysema, PFTs normal.  Symptoms felt to be multifactorial, deconditioning, intermittent volume overload between dialysis sessions.He is currently maintained on Stiolto inhaler.  ESRD on HD Monday Wednesday Friday.  IDDM, on insulin pump.  Patient will need day of surgery labs and evaluation.  EKG 02/24/2022: Sinus rhythm.  Rate 63.  Probable LAE.  Right bundle branch block.  Inferior infarct, age undetermined.  Carotid duplex  03/12/2022: Summary:  Right Carotid: Velocities in the right ICA are consistent with a 1-39% stenosis. Non-hemodynamically significant plaque <50% noted in the CCA. The ECA appears >50% stenosed.  Left Carotid: Velocities in the left ICA are consistent with a 1-39% stenosis. Non-hemodynamically significant plaque <50% noted in the CCA.  Vertebrals:  Left vertebral artery demonstrates antegrade flow. Right vertebral artery demonstrates high resistant flow. Dampened, high resistant waveforms in the right vertebral artery.  Subclavians: Right subclavian artery was stenotic. Right subclavian artery flow was disturbed. Elevated velocities and low resistant waveforms in the left subclavian artery secondary to left arm AVF.   TTE 07/24/2021:  1. Hypokinesis of the basal inferior wall with overall preserved LV  systolic function.   2. Left ventricular ejection fraction, by estimation, is 60 to 65%. The  left ventricle has normal function. The left ventricle demonstrates  regional wall motion abnormalities (see scoring diagram/findings for  description). Left ventricular diastolic  parameters are consistent with Grade II diastolic dysfunction  (pseudonormalization). Elevated left atrial pressure.   3. Right ventricular systolic function is normal. The right ventricular  size is normal. Tricuspid regurgitation signal is inadequate for assessing  PA pressure.   4. Left atrial size was mildly dilated.   5. The mitral valve is normal in structure. Trivial mitral valve  regurgitation. No evidence of mitral stenosis.   6. The aortic valve is tricuspid. Aortic valve regurgitation is trivial.  Mild aortic valve sclerosis is present, with no evidence of aortic valve  stenosis.   7. The inferior vena cava is normal in size with greater than 50%  respiratory variability, suggesting right atrial pressure  of 3 mmHg.   Cardiac cath 04/17/19: Left Anterior Descending: 90% proximal LAD stenosis, subtotal occlusion  of the mid LAD.  LIMA-LAD is patent. Ramus Intermedius: Large ramus with a long stented segment in the proximal to mid vessel.  40 to 50% in-stent restenosis in the distal stented segment.  Totally occluded SVG-ramus. Left Circumflex: The AV LCx is small in caliber, 80% ostial stenosis. Right Coronary Artery: Occluded proximal RCA.  The SVG-RCA is known to be occluded from prior caths and was not injected.  There were extensive left to right collaterals. CONCLUSION: 1. Occluded SVG-RCA and SVG-ramus, patent LIMA-LAD.  2. The large ramus was patent with 40-50% mid ramus stenosis (in-stent restenosis).  3. 80% ostial stenosis very small caliber AV LCx.  Not interventional target.  4. Occluded RCA (known from prior) with collaterals.  - No target for intervention.  Aggressive medical management of angina.     Kenneth [name] [facility] Short Stay Center/Anesthesiology Phone [phone] 04/08/2022 10:29 AM

## 2022-04-09 ENCOUNTER — Other Ambulatory Visit: Payer: Self-pay

## 2022-04-09 ENCOUNTER — Other Ambulatory Visit (HOSPITAL_BASED_OUTPATIENT_CLINIC_OR_DEPARTMENT_OTHER): Payer: Self-pay

## 2022-04-09 ENCOUNTER — Observation Stay (HOSPITAL_COMMUNITY)
Admission: RE | Admit: 2022-04-09 | Discharge: 2022-04-10 | Disposition: A | Discharge status: Home / Self Care | Payer: Medicare Other | Source: Ambulatory Visit | Attending: Vascular Surgery | Admitting: Vascular Surgery

## 2022-04-09 ENCOUNTER — Encounter (HOSPITAL_COMMUNITY)
Admission: RE | Disposition: A | Discharge status: Home / Self Care | Payer: Self-pay | Source: Ambulatory Visit | Attending: Vascular Surgery

## 2022-04-09 ENCOUNTER — Ambulatory Visit (HOSPITAL_COMMUNITY): Payer: Medicare Other

## 2022-04-09 ENCOUNTER — Ambulatory Visit (HOSPITAL_COMMUNITY): Payer: Medicare Other | Admitting: Physician Assistant

## 2022-04-09 ENCOUNTER — Encounter (HOSPITAL_COMMUNITY): Payer: Self-pay | Admitting: Vascular Surgery

## 2022-04-09 DIAGNOSIS — I132 Hypertensive heart and chronic kidney disease with heart failure and with stage 5 chronic kidney disease, or end stage renal disease: Secondary | ICD-10-CM | POA: Diagnosis not present

## 2022-04-09 DIAGNOSIS — Y832 Surgical operation with anastomosis, bypass or graft as the cause of abnormal reaction of the patient, or of later complication, without mention of misadventure at the time of the procedure: Secondary | ICD-10-CM | POA: Insufficient documentation

## 2022-04-09 DIAGNOSIS — Z955 Presence of coronary angioplasty implant and graft: Secondary | ICD-10-CM | POA: Diagnosis not present

## 2022-04-09 DIAGNOSIS — N186 End stage renal disease: Secondary | ICD-10-CM | POA: Diagnosis present

## 2022-04-09 DIAGNOSIS — T82598A Other mechanical complication of other cardiac and vascular devices and implants, initial encounter: Secondary | ICD-10-CM

## 2022-04-09 DIAGNOSIS — Z79899 Other long term (current) drug therapy: Secondary | ICD-10-CM | POA: Diagnosis not present

## 2022-04-09 DIAGNOSIS — E1022 Type 1 diabetes mellitus with diabetic chronic kidney disease: Secondary | ICD-10-CM | POA: Diagnosis not present

## 2022-04-09 DIAGNOSIS — Z794 Long term (current) use of insulin: Secondary | ICD-10-CM | POA: Diagnosis not present

## 2022-04-09 DIAGNOSIS — I252 Old myocardial infarction: Secondary | ICD-10-CM

## 2022-04-09 DIAGNOSIS — Z7901 Long term (current) use of anticoagulants: Secondary | ICD-10-CM | POA: Diagnosis not present

## 2022-04-09 DIAGNOSIS — N185 Chronic kidney disease, stage 5: Secondary | ICD-10-CM | POA: Diagnosis not present

## 2022-04-09 DIAGNOSIS — I480 Paroxysmal atrial fibrillation: Secondary | ICD-10-CM | POA: Insufficient documentation

## 2022-04-09 DIAGNOSIS — Z87891 Personal history of nicotine dependence: Secondary | ICD-10-CM | POA: Insufficient documentation

## 2022-04-09 DIAGNOSIS — Z01818 Encounter for other preprocedural examination: Secondary | ICD-10-CM

## 2022-04-09 DIAGNOSIS — T82590A Other mechanical complication of surgically created arteriovenous fistula, initial encounter: Secondary | ICD-10-CM | POA: Diagnosis present

## 2022-04-09 DIAGNOSIS — J449 Chronic obstructive pulmonary disease, unspecified: Secondary | ICD-10-CM | POA: Diagnosis not present

## 2022-04-09 DIAGNOSIS — I25119 Atherosclerotic heart disease of native coronary artery with unspecified angina pectoris: Secondary | ICD-10-CM

## 2022-04-09 DIAGNOSIS — Z951 Presence of aortocoronary bypass graft: Secondary | ICD-10-CM | POA: Diagnosis not present

## 2022-04-09 DIAGNOSIS — Z992 Dependence on renal dialysis: Secondary | ICD-10-CM | POA: Diagnosis not present

## 2022-04-09 DIAGNOSIS — I5030 Unspecified diastolic (congestive) heart failure: Secondary | ICD-10-CM | POA: Insufficient documentation

## 2022-04-09 DIAGNOSIS — I2510 Atherosclerotic heart disease of native coronary artery without angina pectoris: Secondary | ICD-10-CM | POA: Insufficient documentation

## 2022-04-09 HISTORY — PX: REVISON OF ARTERIOVENOUS FISTULA: SHX6074

## 2022-04-09 HISTORY — PX: INSERTION OF DIALYSIS CATHETER: SHX1324

## 2022-04-09 LAB — GLUCOSE, CAPILLARY
Glucose-Capillary: 230 mg/dL — ABNORMAL HIGH (ref 70–99)
Glucose-Capillary: 131 mg/dL — ABNORMAL HIGH (ref 70–99)
Glucose-Capillary: 68 mg/dL — ABNORMAL LOW (ref 70–99)
Glucose-Capillary: 117 mg/dL — ABNORMAL HIGH (ref 70–99)
Glucose-Capillary: 114 mg/dL — ABNORMAL HIGH (ref 70–99)
Glucose-Capillary: 302 mg/dL — ABNORMAL HIGH (ref 70–99)

## 2022-04-09 LAB — POCT I-STAT, CHEM 8
BUN: 26 mg/dL — ABNORMAL HIGH (ref 8–23)
Calcium, Ion: 0.99 mmol/L — ABNORMAL LOW (ref 1.15–1.40)
Chloride: 90 mmol/L — ABNORMAL LOW (ref 98–111)
Creatinine, Ser: 6.2 mg/dL — ABNORMAL HIGH (ref 0.61–1.24)
Glucose, Bld: 200 mg/dL — ABNORMAL HIGH (ref 70–99)
HCT: 39 % (ref 39.0–52.0)
Hemoglobin: 13.3 g/dL (ref 13.0–17.0)
Potassium: 4.6 mmol/L (ref 3.5–5.1)
Sodium: 133 mmol/L — ABNORMAL LOW (ref 135–145)
TCO2: 31 mmol/L (ref 22–32)

## 2022-04-09 SURGERY — REVISON OF ARTERIOVENOUS FISTULA
Anesthesia: Monitor Anesthesia Care | Site: Chest

## 2022-04-09 MED ORDER — ORAL CARE MOUTH RINSE: 15.0000 mL | Freq: Once | OROMUCOSAL | Status: AC

## 2022-04-09 MED ORDER — CHLORHEXIDINE GLUCONATE 4 % EX LIQD: 60.0000 mL | Freq: Once | CUTANEOUS | Status: DC

## 2022-04-09 MED ORDER — FENTANYL CITRATE (PF) 250 MCG/5ML IJ SOLN
INTRAMUSCULAR | Status: AC
  Filled 2022-04-09: qty 5

## 2022-04-09 MED ORDER — HYDRALAZINE HCL 20 MG/ML IJ SOLN
INTRAMUSCULAR | Status: AC
  Filled 2022-04-09: qty 1

## 2022-04-09 MED ORDER — 0.9 % SODIUM CHLORIDE (POUR BTL) OPTIME
TOPICAL | Status: DC | PRN
  Administered 2022-04-09: 1000 mL

## 2022-04-09 MED ORDER — CHLORHEXIDINE GLUCONATE CLOTH 2 % EX PADS: 6.0000 | MEDICATED_PAD | Freq: Every day | CUTANEOUS | Status: DC

## 2022-04-09 MED ORDER — DEXTROSE 50 % IV SOLN
INTRAVENOUS | Status: DC | PRN
  Administered 2022-04-09: 12.5 g via INTRAVENOUS

## 2022-04-09 MED ORDER — HEPARIN 6000 UNIT IRRIGATION SOLUTION
Status: DC | PRN
  Administered 2022-04-09: 1

## 2022-04-09 MED ORDER — LIDOCAINE-EPINEPHRINE (PF) 1 %-1:200000 IJ SOLN
INTRAMUSCULAR | Status: AC
  Filled 2022-04-09: qty 30

## 2022-04-09 MED ORDER — HEPARIN SODIUM (PORCINE) 1000 UNIT/ML IJ SOLN
INTRAMUSCULAR | Status: DC | PRN
  Administered 2022-04-09: 5000 [IU] via INTRAVENOUS

## 2022-04-09 MED ORDER — SODIUM CHLORIDE 0.9 % IV SOLN: INTRAVENOUS | Status: DC

## 2022-04-09 MED ORDER — PROPOFOL 10 MG/ML IV BOLUS
INTRAVENOUS | Status: AC
  Filled 2022-04-09: qty 20

## 2022-04-09 MED ORDER — PHENYLEPHRINE HCL-NACL 20-0.9 MG/250ML-% IV SOLN
INTRAVENOUS | Status: DC | PRN
  Administered 2022-04-09: 20 ug/min via INTRAVENOUS

## 2022-04-09 MED ORDER — FENTANYL CITRATE (PF) 100 MCG/2ML IJ SOLN
25.0000 ug | INTRAMUSCULAR | Status: DC | PRN
  Administered 2022-04-09: 25 ug via INTRAVENOUS
  Administered 2022-04-09: 50 ug via INTRAVENOUS

## 2022-04-09 MED ORDER — CHLORHEXIDINE GLUCONATE 0.12 % MT SOLN
15.0000 mL | Freq: Once | OROMUCOSAL | Status: AC
  Administered 2022-04-09: 15 mL via OROMUCOSAL
  Filled 2022-04-09: qty 15

## 2022-04-09 MED ORDER — HEPARIN SODIUM (PORCINE) 1000 UNIT/ML IJ SOLN
INTRAMUSCULAR | Status: DC | PRN
  Administered 2022-04-09: 3000 [IU] via INTRAVENOUS

## 2022-04-09 MED ORDER — FENTANYL CITRATE (PF) 100 MCG/2ML IJ SOLN
INTRAMUSCULAR | Status: AC
  Filled 2022-04-09: qty 2

## 2022-04-09 MED ORDER — OXYCODONE-ACETAMINOPHEN 5-325 MG PO TABS
1.0000 | ORAL_TABLET | Freq: Four times a day (QID) | ORAL | 0 refills | Status: DC | PRN
  Filled 2022-04-09: qty 20, 5d supply, fill #0

## 2022-04-09 MED ORDER — HEPARIN SODIUM (PORCINE) 1000 UNIT/ML IJ SOLN
INTRAMUSCULAR | Status: AC
  Filled 2022-04-09: qty 10

## 2022-04-09 MED ORDER — MIDAZOLAM HCL 2 MG/2ML IJ SOLN
INTRAMUSCULAR | Status: AC
  Filled 2022-04-09: qty 2

## 2022-04-09 MED ORDER — HYDRALAZINE HCL 20 MG/ML IJ SOLN
15.0000 mg | Freq: Once | INTRAMUSCULAR | Status: AC
  Administered 2022-04-09: 15 mg via INTRAVENOUS

## 2022-04-09 MED ORDER — INSULIN ASPART 100 UNIT/ML IJ SOLN
0.0000 [IU] | INTRAMUSCULAR | Status: DC | PRN
  Administered 2022-04-09: 3 [IU] via SUBCUTANEOUS
  Filled 2022-04-09: qty 1

## 2022-04-09 MED ORDER — CEFAZOLIN SODIUM-DEXTROSE 2-4 GM/100ML-% IV SOLN
2.0000 g | INTRAVENOUS | Status: AC
  Administered 2022-04-09: 2 g via INTRAVENOUS
  Filled 2022-04-09: qty 100

## 2022-04-09 MED ORDER — HEPARIN 6000 UNIT IRRIGATION SOLUTION
Status: AC
  Filled 2022-04-09: qty 500

## 2022-04-09 MED ORDER — ROPIVACAINE HCL 5 MG/ML IJ SOLN: INTRAMUSCULAR | Status: DC | PRN

## 2022-04-09 MED ORDER — HYDRALAZINE HCL 20 MG/ML IJ SOLN
5.0000 mg | Freq: Once | INTRAMUSCULAR | Status: AC
  Administered 2022-04-09: 5 mg via INTRAVENOUS

## 2022-04-09 MED ORDER — LIDOCAINE-EPINEPHRINE (PF) 1 %-1:200000 IJ SOLN
INTRAMUSCULAR | Status: DC | PRN
  Administered 2022-04-09: 10 mL

## 2022-04-09 MED ORDER — ROPIVACAINE HCL 5 MG/ML IJ SOLN
INTRAMUSCULAR | Status: DC | PRN
  Administered 2022-04-09: 25 mL via PERINEURAL

## 2022-04-09 MED ORDER — PROPOFOL 500 MG/50ML IV EMUL
INTRAVENOUS | Status: DC | PRN
  Administered 2022-04-09: 125 ug/kg/min via INTRAVENOUS
  Administered 2022-04-09: 10 mg via INTRAVENOUS

## 2022-04-09 SURGICAL SUPPLY — 61 items
ARMBAND PINK RESTRICT EXTREMIT (MISCELLANEOUS) ×3 IMPLANT
BAG COUNTER SPONGE SURGICOUNT (BAG) ×3 IMPLANT
BAG DECANTER FOR FLEXI CONT (MISCELLANEOUS) ×3 IMPLANT
BENZOIN TINCTURE PRP APPL 2/3 (GAUZE/BANDAGES/DRESSINGS) ×3 IMPLANT
BIOPATCH RED 1 DISK 7.0 (GAUZE/BANDAGES/DRESSINGS) ×3 IMPLANT
CANISTER SUCT 3000ML PPV (MISCELLANEOUS) ×3 IMPLANT
CANNULA VESSEL 3MM 2 BLNT TIP (CANNULA) ×3 IMPLANT
CATH PALINDROME-P 19CM W/VT (CATHETERS) ×1 IMPLANT
CATH PALINDROME-P 23CM W/VT (CATHETERS) IMPLANT
CATH PALINDROME-P 28CM W/VT (CATHETERS) IMPLANT
CHLORAPREP W/TINT 26 (MISCELLANEOUS) ×3 IMPLANT
COVER PROBE W GEL 5X96 (DRAPES) ×3 IMPLANT
COVER SURGICAL LIGHT HANDLE (MISCELLANEOUS) ×3 IMPLANT
DERMABOND ADVANCED (GAUZE/BANDAGES/DRESSINGS) ×1
DERMABOND ADVANCED .7 DNX12 (GAUZE/BANDAGES/DRESSINGS) IMPLANT
DRAPE C-ARM 42X72 X-RAY (DRAPES) ×3 IMPLANT
DRAPE CHEST BREAST 15X10 FENES (DRAPES) ×3 IMPLANT
DRSG COVADERM 4X8 (GAUZE/BANDAGES/DRESSINGS) ×1 IMPLANT
ELECT REM PT RETURN 9FT ADLT (ELECTROSURGICAL) ×3
ELECTRODE REM PT RTRN 9FT ADLT (ELECTROSURGICAL) ×2 IMPLANT
GAUZE 4X4 16PLY ~~LOC~~+RFID DBL (SPONGE) ×3 IMPLANT
GAUZE SPONGE 4X4 12PLY STRL (GAUZE/BANDAGES/DRESSINGS) ×3 IMPLANT
GLOVE BIO SURGEON STRL SZ8 (GLOVE) ×3 IMPLANT
GOWN STRL REUS W/ TWL LRG LVL3 (GOWN DISPOSABLE) ×4 IMPLANT
GOWN STRL REUS W/ TWL XL LVL3 (GOWN DISPOSABLE) ×2 IMPLANT
GOWN STRL REUS W/TWL LRG LVL3 (GOWN DISPOSABLE) ×6
GOWN STRL REUS W/TWL XL LVL3 (GOWN DISPOSABLE) ×3
INSERT FOGARTY SM (MISCELLANEOUS) IMPLANT
KIT BASIN OR (CUSTOM PROCEDURE TRAY) ×3 IMPLANT
KIT PALINDROME-P 55CM (CATHETERS) IMPLANT
KIT TURNOVER KIT B (KITS) ×3 IMPLANT
NDL 18GX1X1/2 (RX/OR ONLY) (NEEDLE) ×2 IMPLANT
NDL HYPO 25GX1X1/2 BEV (NEEDLE) ×2 IMPLANT
NEEDLE 18GX1X1/2 (RX/OR ONLY) (NEEDLE) ×3 IMPLANT
NEEDLE HYPO 25GX1X1/2 BEV (NEEDLE) ×3 IMPLANT
NS IRRIG 1000ML POUR BTL (IV SOLUTION) ×3 IMPLANT
PACK CV ACCESS (CUSTOM PROCEDURE TRAY) ×3 IMPLANT
PACK SURGICAL SETUP 50X90 (CUSTOM PROCEDURE TRAY) ×3 IMPLANT
PAD ARMBOARD 7.5X6 YLW CONV (MISCELLANEOUS) ×6 IMPLANT
SET MICROPUNCTURE 5F STIFF (MISCELLANEOUS) ×3 IMPLANT
SLING ARM FOAM STRAP LRG (SOFTGOODS) IMPLANT
SLING ARM FOAM STRAP MED (SOFTGOODS) IMPLANT
SOAP 2 % CHG 4 OZ (WOUND CARE) ×3 IMPLANT
STRIP CLOSURE SKIN 1/2X4 (GAUZE/BANDAGES/DRESSINGS) ×3 IMPLANT
STRIP CLOSURE SKIN 1/4X3 (GAUZE/BANDAGES/DRESSINGS) ×1 IMPLANT
SUT ETHILON 3 0 PS 1 (SUTURE) ×3 IMPLANT
SUT MNCRL AB 4-0 PS2 18 (SUTURE) ×3 IMPLANT
SUT PROLENE 5 0 C 1 24 (SUTURE) ×3 IMPLANT
SUT PROLENE 6 0 BV (SUTURE) ×3 IMPLANT
SUT VIC AB 3-0 SH 27 (SUTURE) ×3
SUT VIC AB 3-0 SH 27X BRD (SUTURE) ×2 IMPLANT
SYR 10ML LL (SYRINGE) ×3 IMPLANT
SYR 20ML LL LF (SYRINGE) ×6 IMPLANT
SYR 3ML LL SCALE MARK (SYRINGE) ×3 IMPLANT
SYR 5ML LL (SYRINGE) ×3 IMPLANT
SYR CONTROL 10ML LL (SYRINGE) ×3 IMPLANT
TOWEL GREEN STERILE (TOWEL DISPOSABLE) ×3 IMPLANT
TOWEL GREEN STERILE FF (TOWEL DISPOSABLE) ×6 IMPLANT
UNDERPAD 30X36 HEAVY ABSORB (UNDERPADS AND DIAPERS) ×3 IMPLANT
WATER STERILE IRR 1000ML POUR (IV SOLUTION) ×3 IMPLANT
WIRE TORQFLEX AUST .018X40CM (WIRE) ×1 IMPLANT

## 2022-04-09 NOTE — Discharge Instructions (Signed)
[facility]  Discharge Instructions  AV Fistula or Graft Surgery for Dialysis Access  Please refer to the following instructions for your post-procedure care. Your surgeon or physician assistant will discuss any changes with you.  Activity  You may drive the day following your surgery, if you are comfortable and no longer taking prescription pain medication. Resume full activity as the soreness in your incision resolves.  Bathing/Showering  You may shower after you go home. Keep your incision dry for 48 hours. Do not soak in a bathtub, hot tub, or swim until the incision heals completely. You may not shower if you have a hemodialysis catheter.  Incision Care  Clean your incision with mild soap and water after 48 hours. Pat the area dry with a clean towel. You do not need a bandage unless otherwise instructed. Do not apply any ointments or creams to your incision. You may have skin glue on your incision. Do not peel it off. It will come off on its own in about one week. Your arm may swell a bit after surgery. To reduce swelling use pillows to elevate your arm so it is above your heart. Your doctor will tell you if you need to lightly wrap your arm with an ACE bandage.  Diet  Resume your normal diet. There are not special food restrictions following this procedure. In order to heal from your surgery, it is CRITICAL to get adequate nutrition. Your body requires vitamins, minerals, and protein. Vegetables are the best source of vitamins and minerals. Vegetables also provide the perfect balance of protein. Processed food has little nutritional value, so try to avoid this.  Medications  Resume taking all of your medications. If your incision is causing pain, you may take over-the counter pain relievers such as acetaminophen (Tylenol). If you were prescribed a stronger pain medication, please be aware these medications can cause nausea and constipation. Prevent  nausea by taking the medication with a snack or meal. Avoid constipation by drinking plenty of fluids and eating foods with high amount of fiber, such as fruits, vegetables, and grains.  Do not take Tylenol if you are taking prescription pain medications.  Follow up Your surgeon may want to see you in the office following your access surgery. If so, this will be arranged at the time of your surgery.  Please call us immediately for any of the following conditions:  Increased pain, redness, drainage (pus) from your incision site Fever of 101 degrees or higher Severe or worsening pain at your incision site Hand pain or numbness.  Reduce your risk of vascular disease:  Stop smoking. If you would like help, call [facility] at 1-800-QUIT-NOW 564-714-4813) or [place] at [place] your cholesterol Maintain a desired weight Control your diabetes Keep your blood pressure down  Dialysis  It will take several weeks to several months for your new dialysis access to be ready for use. Your surgeon will determine when it is okay to use it. Your nephrologist will continue to direct your dialysis. You can continue to use your Permcath until your new access is ready for use.   04/09/2022 Kenneth [name] [id number] Sep 07, 1956  Surgeon(s): Cherre Robins, MD  Procedure(s): LEFT ARM FISTULA REVISION INSERTION OF TUNNELED DIALYSIS CATHETER   May stick graft immediately   May stick graft on designated area only:   X Do not stick left AV fistula for 6 weeks    If you have any questions, please  call the office at [phone].

## 2022-04-09 NOTE — Progress Notes (Signed)
Patient admitted from PACU s/p left arm fistula revision and placement of right IJ temp HD catheter. Patient is alert and oriented. NSR on monitor. CCMD notified. Patient oriented to unit, staff and call bell. No drainage from incisions noted. VS all WNL. Fuller Canada, RN

## 2022-04-09 NOTE — Anesthesia Procedure Notes (Addendum)
Anesthesia Regional Block: Supraclavicular block   Pre-Anesthetic Checklist: , timeout performed,  Correct Patient, Correct Site, Correct Laterality,  Correct Procedure, Correct Position, site marked,  Risks and benefits discussed,  Surgical consent,  Pre-op evaluation,  At surgeon's request  Laterality: Left  Prep: chloraprep       Needles:   Needle Type: Stimulator Needle - 40          Additional Needles:   Procedures: Doppler guided,,,, ultrasound used (permanent image in chart),,    Narrative:  Start time: 04/09/2022 7:15 AM End time: 04/09/2022 7:35 AM Injection made incrementally with aspirations every 5 mL.  Performed by: Other  Anesthesiologist: Belinda Block, MD

## 2022-04-09 NOTE — Op Note (Signed)
DATE OF SERVICE: 04/09/2022  PATIENT:  Kenneth [name]  66 y.o. male  PRE-OPERATIVE DIAGNOSIS:  ESRD, ulcerated left arm fistula  POST-OPERATIVE DIAGNOSIS:  Same  PROCEDURE:   1) Ultrasound and fluoroscopic guided placement of right internal jugular tunneled dialysis catheter (50PT) 2) plication of left arm arteriovenous fistula  SURGEON:  Surgeon(s) and Role:    * Cherre Robins, MD - Primary  ASSISTANT: Paulo Fruit, PA-C  An experienced assistant was required given the complexity of this procedure and the standard of surgical care. My assistant helped with exposure through counter tension, suctioning, ligation and retraction to better visualize the surgical field.  My assistant expedited sewing during the case by following my sutures. Wherever I use the term "we" in the report, my assistant actively helped me with that portion of the procedure.  ANESTHESIA:   local, regional, and MAC  EBL: minimal  BLOOD ADMINISTERED:none  DRAINS: none   LOCAL MEDICATIONS USED:  LIDOCAINE   SPECIMEN:  none  COUNTS: confirmed correct.  TOURNIQUET:  none  PATIENT DISPOSITION:  PACU - hemodynamically stable.   Delay start of Pharmacological VTE agent (>24hrs) due to surgical blood loss or risk of bleeding: no  INDICATION FOR PROCEDURE: Kenneth [name] is a 66 y.o. 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 After careful discussion of risks, benefits, and alternatives the patient was offered revision of left arm fistula with placement of right internal jugular tunneled dialysis catheter. The patient understood and wished to proceed.  OPERATIVE FINDINGS: unremarkable TDC. Unremarkable plication.   DESCRIPTION OF PROCEDURE: After identification of the patient in the pre-operative holding area, the patient was transferred to the operating room. The patient was positioned supine on the operating room table. Anesthesia was induced. The right neck, chest, and  left arm were prepped and draped in standard fashion. A surgical pause was performed confirming correct patient, procedure, and operative location.  Using ultrasound guidance the right internal jugular vein was accessed with micropuncture technique.  Through the micropuncture sheath a floppy J-wire was advanced into the superior vena cava.  A small incision was made around the skin access point.  The access point was serially dilated under direct fluoroscopic guidance.  A peel-away sheath was introduced into the superior vena cava under fluoroscopic guidance.  A counterincision was made in the chest under the clavicle.  A 19 cm tunnel dialysis catheter was then tunneled under the skin, over the clavicle into the incision in the neck.  The tunneling device was removed and the catheter fed through the peel-away sheath into the superior vena cava.  The peel-away sheath was removed and the catheter gently pulled back.  Adequate position was confirmed with x-ray.  The catheter was tested and found to flush and draw back well.  Catheter was heparin locked.  Caps were applied.  Catheter was sutured to the skin.  The neck incision was closed with 4-0 Monocryl.  An ellipse incision was made around the ulcerated fistula in the left arm.  The incision was carried down through subcutaneous tissue until the fistula was identified.  Using careful dissection the fistula was skeletonized from the healthy portion in the antecubitum to a healthy portion in the outflow in the upper arm.  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  Patient was systemically heparinized with 5000 units of IV heparin.  Gregory clamps were applied to the fistula proximally and distally.  The anterior aspect of the fistula and overlying ulcerated  skin was excised with a curved Mayo scissor.  The fistula was then plicated using a 2 layer closure of 5-0 Prolene.  Prior to completion the repair was flushed and de-aired.  Clamps were released.  2 areas of  bleeding were repaired with interrupted suture.  Brisk thrill was noted through the fistula.  The inflow was a bit more pulsatile, but I suspect this is because a single area of stenosis was created in plicating the fistula.  There is a smooth thrill throughout the rest of the fistula.  Hemostasis was achieved in the repair in the surgical bed.  The wound was closed in layers using 3-0 Vicryl and 4-0 Monocryl.  Benzoin and Steri-Strips were applied.  A clean bandage was applied.  Upon completion of the case instrument and sharps counts were confirmed correct. The patient was transferred to the PACU in good condition. I was present for all portions of the procedure.  Yevonne Aline. Stanford Breed, MD [facility] Phone Number: [phone] 04/09/2022 9:29 AM

## 2022-04-09 NOTE — Anesthesia Procedure Notes (Signed)
Procedure Name: MAC Date/Time: 04/09/2022 7:42 AM  Performed by: Leonor Liv, CRNAPre-anesthesia Checklist: Patient identified, Emergency Drugs available, Suction available, Timeout performed and Patient being monitored Patient Re-evaluated:Patient Re-evaluated prior to induction Oxygen Delivery Method: Simple face mask Placement Confirmation: positive ETCO2 Dental Injury: Teeth and Oropharynx as per pre-operative assessment

## 2022-04-09 NOTE — Care Management Obs Status (Signed)
[facility] NOTIFICATION   Patient Details  Name: EHREN BERISHA MRN: [id number] Date of Birth: 09-Sep-1956   Medicare Observation Status Notification Given:  Ernesta Amble, RN 04/09/2022, 5:35 PM

## 2022-04-09 NOTE — Consult Note (Signed)
Renal Service Consult Note [name]  Kenneth [name] 04/09/2022 [name], MD Requesting Physician: Dr. Stanford Breed  Reason for Consult: ESRD pt sp AVF plication and TDC placement HPI: The patient is a 66 y.o. year-old w/ hx of CAD sp CABG, HFpEF, ESRD on HD, IDDM, HTN, HL, PAF who presented for access surgery which was done today by [facility].  Pt had plication of the LUE AVF, and a new TDC was placed in RIJ position. We were asked to see for dialysis.   Pt is drowsy and doesn't give much hx, seen in room.   6/13- 15, 2023 - admitted for partial seizure d/o, was dc'd on Keppra. Esrd on HD, vol overload, COPD hx, PAF on eliquis. CAD hx CABG 2017. HTN. HL, IDDM 6/10- 6/12, 2023 - admitted for hypoxemia due to HCAP. Hx chronic COPD as well. Had IP HD for vol overload and eventually w/ abx and HD he was able to come off of supplementary O2 and was dc'd home. Pt was covid negative. Sent home w/ 3d augmentin and 5d of doxy po.    ROS - denies CP, no joint pain, no HA, no blurry vision, no rash, no diarrhea, no nausea/ vomiting   Past Medical History  Past Medical History:  Diagnosis Date   CAD (coronary artery disease) 10/30/2016   NSTEMI 9/17 with CABG x 3 (LIMA-LAD, SVG-OM, SVG-RCA).   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   type 1 DM   ESRD on hemodialysis ([place])    "MWF; [facility]" (03/30/2018)   Gastroesophageal reflux disease 08/18/2016   Hyperlipidemia    Hypertension    Ischemic rest pain of lower extremity 02/06/2015   Keratoma 02/27/2015   Metatarsal deformity 02/27/2015   PAF (paroxysmal atrial fibrillation) (H[facility])    Pronation deformity of both feet 02/27/2015   Past Surgical History  Past Surgical History:  Procedure Laterality Date   A/V FISTULAGRAM Left 03/06/2021   Procedure: A/V FISTULAGRAM;  Surgeon: Marty Heck, MD;  Location: [place] [facility];  Service: Cardiovascular;  Laterality: Left;   AV FISTULA PLACEMENT Left 06/22/2016   Procedure: ARTERIOVENOUS (AV) FISTULA CREATION LEFT UPPER ARM;  Surgeon: Rosetta Posner, MD;  Location: [place];  Service: Vascular;  Laterality: Left;   BIOPSY  12/19/2020   Procedure: BIOPSY;  Surgeon: Ladene Artist, MD;  Location: [place];  Service: Endoscopy;;   CARDIAC CATHETERIZATION N/A 05/31/2016   Procedure: Left Heart Cath and Coronary Angiography;  Surgeon: Jettie Booze, MD;  Location: [place] [facility];  Service: Cardiovascular;  Laterality: N/A;   CARDIAC CATHETERIZATION N/A 05/31/2016   Procedure: Right Heart Cath;  Surgeon: Jettie Booze, MD;  Location: [place] [facility];  Service: Cardiovascular;  Laterality: N/A;   CARDIAC CATHETERIZATION N/A 05/31/2016   Procedure: IABP Insertion;  Surgeon: Jettie Booze, MD;  Location: [place] [facility];  Service: Cardiovascular;  Laterality: N/A;   COLONOSCOPY WITH PROPOFOL N/A 12/20/2020   Procedure: COLONOSCOPY WITH PROPOFOL;  Surgeon: Mauri Pole, MD;  Location: [place] [facility];  Service: Endoscopy;  Laterality: N/A;   CORONARY ARTERY BYPASS GRAFT N/A 06/05/2016   Procedure: CORONARY ARTERY BYPASS GRAFTING (CABG) x3 LIMA to LAD -SVG to OM -SVG to RCA;  Surgeon: Ivin Poot, MD;  Location: [place];  Service: Open Heart Surgery;  Laterality: N/A;   CORONARY STENT INTERVENTION N/A 07/07/2017   Procedure: CORONARY STENT INTERVENTION;  Surgeon: Wellington Hampshire, MD;  Location: [place] [facility];  Service: Cardiovascular;  Laterality: N/A;   CORONARY STENT INTERVENTION N/A 03/30/2018   Procedure: CORONARY STENT INTERVENTION;  Surgeon: Martinique, Peter M, MD;  Location: [place] [facility];  Service: Cardiovascular;  Laterality: N/A;   ESOPHAGOGASTRODUODENOSCOPY (EGD) WITH PROPOFOL N/A 12/19/2020   Procedure: ESOPHAGOGASTRODUODENOSCOPY (EGD) WITH PROPOFOL;  Surgeon: Ladene Artist, MD;   Location: [facility];  Service: Endoscopy;  Laterality: N/A;   GIVENS CAPSULE STUDY N/A 12/20/2020   Procedure: GIVENS CAPSULE STUDY;  Surgeon: Mauri Pole, MD;  Location: [place];  Service: Endoscopy;  Laterality: N/A;   INSERTION OF DIALYSIS CATHETER N/A 06/05/2016   Procedure: INSERTION OF DIALYSIS/trialysis CATHETER;  Surgeon: Ivin Poot, MD;  Location: [place];  Service: Vascular;  Laterality: N/A;   INSERTION OF DIALYSIS CATHETER Right 06/13/2016   Procedure: INSERTION OF DIALYSIS CATHETER RIGHT INTERNAL JUGULAR;  Surgeon: Conrad Rockvale, MD;  Location: [place];  Service: Vascular;  Laterality: Right;   INSERTION OF DIALYSIS CATHETER Right 09/07/2019   Procedure: Insertion Of Dialysis Catheter;  Surgeon: Rosetta Posner, MD;  Location[name];  Service: Vascular;  Laterality: Right;   INTRAOPERATIVE TRANSESOPHAGEAL ECHOCARDIOGRAM N/A 06/05/2016   Procedure: INTRAOPERATIVE TRANSESOPHAGEAL ECHOCARDIOGRAM;  Surgeon: Ivin Poot, MD;  Location: [place];  Service: Open Heart Surgery;  Laterality: N/A;   LEFT HEART CATH AND CORS/GRAFTS ANGIOGRAPHY N/A 11/03/2016   Procedure: Left Heart Cath and Cors/Grafts Angiography;  Surgeon: Troy Sine, MD;  Location: [place] [facility];  Service: Cardiovascular;  Laterality: N/A;   LEFT HEART CATH AND CORS/GRAFTS ANGIOGRAPHY N/A 07/07/2017   Procedure: LEFT HEART CATH AND CORS/GRAFTS ANGIOGRAPHY;  Surgeon: Larey Dresser, MD;  Location: [place] [facility];  Service: Cardiovascular;  Laterality: N/A;   LEFT HEART CATH AND CORS/GRAFTS ANGIOGRAPHY N/A 03/30/2018   Procedure: LEFT HEART CATH AND CORS/GRAFTS ANGIOGRAPHY;  Surgeon: Martinique, Peter M, MD;  Location: [place] [facility];  Service: Cardiovascular;  Laterality: N/A;   LEFT HEART CATH AND CORS/GRAFTS ANGIOGRAPHY N/A 04/17/2019   Procedure: LEFT HEART CATH AND CORS/GRAFTS ANGIOGRAPHY;  Surgeon: Larey Dresser, MD;  Location: [place] [facility];  Service: Cardiovascular;  Laterality: N/A;    PERIPHERAL VASCULAR INTERVENTION Left 03/06/2021   Procedure: PERIPHERAL VASCULAR INTERVENTION;  Surgeon: Marty Heck, MD;  Location: [place] [facility];  Service: Cardiovascular;  Laterality: Left;   POLYPECTOMY  12/20/2020   Procedure: POLYPECTOMY;  Surgeon: Mauri Pole, MD;  Location: [place];  Service: Endoscopy;;   REVISION OF ARTERIOVENOUS GORETEX GRAFT Left 25/85/2778   Procedure: PLICATION OF ANEURYSM OF ARTERIOVENOUS FISTULA  LEFT ARM;  Surgeon: Rosetta Posner, MD;  Location: [place];  Service: Vascular;  Laterality: Left;   REVISON OF ARTERIOVENOUS FISTULA Left 09/07/2019   Procedure: REVISON OF LEFT UPPER ARM ARTERIOVENOUS FISTULA;  Surgeon: Rosetta Posner, MD;  Location: [place];  Service: Vascular;  Laterality: Left;   RIGHT/LEFT HEART CATH AND CORONARY ANGIOGRAPHY N/A 01/03/2021   Procedure: RIGHT/LEFT HEART CATH AND CORONARY ANGIOGRAPHY;  Surgeon: Larey Dresser, MD;  Location: [place] [facility];  Service: Cardiovascular;  Laterality: N/A;   Family History  Family History  Problem Relation Age of Onset   CAD Father    Colon cancer Father    Diabetes Brother    Social History  reports that he quit smoking about 9 years ago. His smoking use included cigarettes. He has a 52.50 pack-year smoking history. He has never used smokeless tobacco. He reports that he does not drink alcohol and does not use drugs. Allergies No Known Allergies Home medications Prior to Admission medications   Medication Sig Start Date End Date Taking? Authorizing Provider  apixaban (ELIQUIS) 5 MG TABS tablet Take 1 tablet by mouth 2 times daily 10/17/21  Yes Larey Dresser, MD  atorvastatin (LIPITOR) 80 MG tablet Take 1 tablet (80 mg total) by mouth daily. 10/16/21  Yes Larey Dresser, MD  calcium acetate (PHOSLO) 667 MG capsule Take 667 mg by mouth 3 (three) times daily with meals.  07/09/17  Yes [provider]  carvedilol (COREG) 6.25 MG tablet Take 1 tablet (6.25 mg total) by  mouth 2 (two) times daily with a meal. Hold on the mornings of dialysis. 02/10/22  Yes Wendling, Crosby Oyster, DO  Continuous Blood Gluc Sensor (FREESTYLE LIBRE 2 SENSOR) MISC 1 Device by Does not apply route every 14 (fourteen) days. 10/28/21  Yes Shamleffer, Melanie Crazier, MD  diclofenac Sodium (VOLTAREN) 1 % GEL Apply 4 grams topically 4 (four) times daily. 09/16/21  Yes Wendling, Crosby Oyster, DO  ezetimibe (ZETIA) 10 MG tablet Take 1 tablet by mouth daily 05/20/21  Yes Larey Dresser, MD  glucose blood [facility] VERIO) test strip 1 each by Other route in the morning, at noon, in the evening, and at bedtime. Use as instructed 01/01/22  Yes Shamleffer, Melanie Crazier, MD  hydrALAZINE (APRESOLINE) 100 MG tablet Take 1 tablet (100 mg total) by mouth 3 (three) times daily. DO NOT TAKE AM AND NOON DOSES ON HD DAYS 09/16/21  Yes Wendling, Crosby Oyster, DO  Insulin Lispro-aabc (LYUMJEV) 100 UNIT/ML SOLN Max Daily 30 units per insulin pump 01/06/22  Yes Shamleffer, Melanie Crazier, MD  Insulin Pen Needle (PEN NEEDLES) 32G X 6 MM MISC Use as directed 3 times daily 12/18/21  Yes Shamleffer, Melanie Crazier, MD  isosorbide mononitrate (IMDUR) 60 MG 24 hr tablet Take 1&1/2 tablets (90 mg total) by mouth daily. Hold on days of dialysis on Mon, Wed, Fri. 02/10/22  Yes Larey Dresser, MD  levETIRAcetam (KEPPRA) 250 MG tablet Take 1 tablet (250 mg total) by mouth every Monday, Wednesday, and Friday at 8 PM. 03/25/22  Yes Wendling, Crosby Oyster, DO  levETIRAcetam (KEPPRA) 500 MG tablet Take 1 tablet (500 mg total) by mouth at bedtime. 03/24/22  Yes Wendling, Crosby Oyster, DO  Methoxy PEG-Epoetin Beta (MIRCERA IJ) Inject as directed.   Yes [provider]  oxyCODONE-acetaminophen (PERCOCET) 5-325 MG tablet Take 1 tablet by mouth every 6 (six) hours as needed for severe pain. 04/09/22 04/09/23 Yes Baglia, Corrina, PA-C  pantoprazole (PROTONIX) 40 MG tablet Take 1 tablet (40 mg total) by mouth 2 (two) times daily.  10/23/21  Yes Hunsucker, Bonna Gains, MD  sertraline (ZOLOFT) 50 MG tablet Take 1 tablet (50 mg total) by mouth daily. 11/24/21  Yes Shelda Pal, DO  Sodium Zirconium Cyclosilicate (LOKELMA PO) Take 1 packet by mouth 4 (four) times a week. Hold on dialysis days   Yes [provider]  Tiotropium Bromide-Olodaterol (STIOLTO RESPIMAT) 2.5-2.5 MCG/ACT AERS Inhale 2 puffs by mouth into the lungs daily. 01/20/22  Yes Hunsucker, Bonna Gains, MD  megestrol (MEGACE ES) 625 MG/5ML suspension Take 5 mLs (625 mg total) by mouth daily. 03/03/22   Shelda Pal, DO     Vitals:  04/09/22 1215 04/09/22 1245 04/09/22 1300 04/09/22 1323  BP: 139/60 (!) 118/56 (!) 125/57 (!) 122/53  Pulse: 63 61 61 (!) 59  Resp: 15 13 12 16   Temp:   97.7 F (36.5 C) 97.6 F (36.4 C)  TempSrc:    Oral  SpO2: 99% 96% 95% 94%  Weight:      Height:       Exam Gen alert, no distress No rash, cyanosis or gangrene Sclera anicteric, throat clear  No jvd or bruits Chest clear bilat to bases, no rales/ wheezing RRR no MRG Abd soft ntnd no mass or ascites +bs GU normal MS no joint effusions or deformity Ext no LE or UE edema, no wounds or ulcers Neuro is alert, Ox 3 , nf    LUA AVF wrapped, +bruit, new RIJ TDC in place    Home meds include - apixaban, atorvastatin, calcium acetate 1 ac, carvedilol 6.25 bid, ezetimibe, hydralazine 100 tid, insulin lispro, isosorbide dinitrate 30 qd, levetiracetam, pantoprazole, sertraline, sodium zirconium cyclosilicate, triotropium bromid-olodaterol, megestrol qd, prns/ vits/ supps    Na 133  K 4.6  BUN 26  creat 6.20   Ca 8.5   OP HD: [facility] MWF  3h 75min 400/500  2/2 bath  Hep none  new RIJ TDC/ LUA AVF (plicated)  - get records   Assessment/ Plan: S/P plication LUA AVF - by [facility] on 7/27. With new RIJ TDC placed, AVF will be rested.  ESRD - last HD yesterday. HD tomorrow on schedule (if still here).  HTN/ vol - BP's high normal, cont coreg/ hydralazine  here. Get vol down w/ HD. Hx of large wt gains.  Anemia esrd - Hb 12- 13 here. No esa needs.  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 Cont phoslo as binder. Get OP records.  CAD hx of CABG (2017) IDDM - per pmd PAF - on eliquis, per pmd HFpEF - last echo here in nov 2022 w/ LVEF 60-65%      Kelly Splinter  MD 04/09/2022, 2:53 PM Recent Labs  Lab 04/09/22 0637  HGB 13.3  CREATININE 6.20*  K 4.6

## 2022-04-09 NOTE — Transfer of Care (Signed)
Immediate Anesthesia Transfer of Care Note  Patient: Kenneth [name]  Procedure(s) Performed: LEFT ARM FISTULA REVISION (Left: Arm Upper) INSERTION OF TUNNELED DIALYSIS CATHETER (Chest)  Patient Location: PACU  Anesthesia Type:MAC  Level of Consciousness: drowsy  Airway & Oxygen Therapy: Patient Spontanous Breathing  Post-op Assessment: Report given to RN and Post -op Vital signs reviewed and stable  Post vital signs: Reviewed and stable  Last Vitals:  Vitals Value Taken Time  BP 148/58 04/09/22 1027  Temp 36.4 C 04/09/22 1027  Pulse 62 04/09/22 1028  Resp 11 04/09/22 1028  SpO2 97 % 04/09/22 1028  Vitals shown include unvalidated device data.  Last Pain:  Vitals:   04/09/22 1027  TempSrc:   PainSc: 0-No pain      Patients Stated Pain Goal: 3 (42/70/62 3762)  Complications: No notable events documented.

## 2022-04-09 NOTE — Anesthesia Postprocedure Evaluation (Signed)
Anesthesia Post Note  Patient: DUC CROCKET  Procedure(s) Performed: LEFT ARM FISTULA REVISION (Left: Arm Upper) INSERTION OF TUNNELED DIALYSIS CATHETER (Chest)     Patient location during evaluation: PACU Anesthesia Type: MAC and Regional Level of consciousness: awake Pain management: pain level controlled Vital Signs Assessment: post-procedure vital signs reviewed and stable Respiratory status: spontaneous breathing Cardiovascular status: stable Postop Assessment: no apparent nausea or vomiting Anesthetic complications: no   No notable events documented.  Last Vitals:  Vitals:   04/09/22 1300 04/09/22 1323  BP: (!) 125/57 (!) 122/53  Pulse: 61 (!) 59  Resp: 12 16  Temp: 36.5 C 36.4 C  SpO2: 95% 94%    Last Pain:  Vitals:   04/09/22 1329  TempSrc:   PainSc: 0-No pain                 Lorenza Winkleman

## 2022-04-09 NOTE — Interval H&P Note (Signed)
History and Physical Interval Note:  04/09/2022 7:35 AM  Kenneth [name]  has presented today for surgery, with the diagnosis of ESRD.  The various methods of treatment have been discussed with the patient and family. After consideration of risks, benefits and other options for treatment, the patient has consented to  Procedure(s) with comments: LEFT ARM FISTULA REVISION (Left) - PERIPHERAL NERVE BLOCK INSERTION OF TUNNELED DIALYSIS CATHETER (N/A) as a surgical intervention.  [name] history has been reviewed, patient examined, no change in status, stable for surgery.  I have reviewed the patient's chart and labs.  Questions were answered to the patient's satisfaction.     Cherre Robins

## 2022-04-09 NOTE — Care Management CC44 (Signed)
Condition Code 44 Documentation Completed  Patient Details  Name: GRESHAM CAETANO MRN: [id number] Date of Birth: 06-10-1956   Condition Code 44 given:  Yes Patient signature on Condition Code 44 notice:  Yes Documentation of 2 MD's agreement:  Yes Code 44 added to claim:  Yes    Laurena Slimmer, RN 04/09/2022, 5:35 PM

## 2022-04-10 ENCOUNTER — Encounter (HOSPITAL_COMMUNITY): Payer: Self-pay | Admitting: Vascular Surgery

## 2022-04-10 DIAGNOSIS — T82590A Other mechanical complication of surgically created arteriovenous fistula, initial encounter: Secondary | ICD-10-CM | POA: Diagnosis not present

## 2022-04-10 LAB — HEPATITIS B CORE ANTIBODY, TOTAL: Hep B Core Total Ab: REACTIVE — AB

## 2022-04-10 LAB — GLUCOSE, CAPILLARY: Glucose-Capillary: 328 mg/dL — ABNORMAL HIGH (ref 70–99)

## 2022-04-10 LAB — HEPATITIS B SURFACE ANTIBODY,QUALITATIVE: Hep B S Ab: REACTIVE — AB

## 2022-04-10 LAB — HEPATITIS B SURFACE ANTIGEN: Hepatitis B Surface Ag: NONREACTIVE

## 2022-04-10 LAB — HEPATITIS C ANTIBODY: HCV Ab: NONREACTIVE

## 2022-04-10 MED ORDER — HEPARIN SODIUM (PORCINE) 1000 UNIT/ML IJ SOLN
INTRAMUSCULAR | Status: AC
  Filled 2022-04-10: qty 4

## 2022-04-10 NOTE — Progress Notes (Signed)
Pt receives out-pt HD at [facility] on MWF. Clinic contacted to be advised pt for d/c today and should resume care on Monday.   Melven Sartorius Renal Navigator [phone]

## 2022-04-10 NOTE — Progress Notes (Signed)
[facility] Progress Note   Subjective:    Seen and examined patient at bedside. Patient resting and appears comfortable. Currently NAD. S/p L AVF plication and RIJ TDC placement 7/27 by Dr. Stanford Breed. Informed by HD RN of difficulty running HD today. Machine continued to alarm, lines reversed and still unsuccessful. HD completed early today (3hrs) and noted no blood return from arterial port. Removed 1.6L. Discussed issue with [facility].  Objective Vitals:   04/10/22 1021 04/10/22 1040 04/10/22 1057 04/10/22 1100  BP: (!) 157/69 133/63 (!) 144/68 (!) 144/68  Pulse: 61 (!) 59 60 (!) 59  Resp: 15 15 16 16   Temp:   98.6 F (37 C) 98.3 F (36.8 C)  TempSrc:   Oral Oral  SpO2: 99% 99% 98% 98%  Weight:      Height:       Physical Exam General: Alert; NAD Heart: S1 and S2; No murmurs, gallops, or rubs Lungs: Clear bilaterally; No wheezing, rales, or rhonchi Abdomen: Soft and non-tender Extremities: No edema BLLE Dialysis Access: L AVF-wrapped; RIJ TDC-placed 7/27   Filed Weights   04/08/22 1215 04/09/22 0549 04/10/22 0726  Weight: 57.6 kg 59.9 kg 59.9 kg    Intake/Output Summary (Last 24 hours) at 04/10/2022 1334 Last data filed at 04/10/2022 1021 Gross per 24 hour  Intake --  Output 1.6 ml  Net -1.6 ml    Additional Objective Labs: Basic Metabolic Panel: Recent Labs  Lab 04/09/22 0637  NA 133*  K 4.6  CL 90*  GLUCOSE 200*  BUN 26*  CREATININE 6.20*   Liver Function Tests: No results for input(s): "AST", "ALT", "ALKPHOS", "BILITOT", "PROT", "ALBUMIN" in the last 168 hours. No results for input(s): "LIPASE", "AMYLASE" in the last 168 hours. CBC: Recent Labs  Lab 04/09/22 0637  HGB 13.3  HCT 39.0   Blood Culture    Component Value Date/Time   SDES BLOOD RIGHT HAND 02/21/2022 1952   SPECREQUEST  02/21/2022 1952    BOTTLES DRAWN AEROBIC AND ANAEROBIC Blood Culture adequate volume   CULT  02/21/2022 1952    NO GROWTH 5 DAYS Performed at [facility], [place] [address] [place], [place] [address]    REPTSTATUS 02/26/2022 FINAL 02/21/2022 1952    Cardiac Enzymes: No results for input(s): "CKTOTAL", "CKMB", "CKMBINDEX", "TROPONINI" in the last 168 hours. CBG: Recent Labs  Lab 04/09/22 0913 04/09/22 0936 04/09/22 1045 04/09/22 2132 04/10/22 0539  GLUCAP 131* 117* 114* 302* 328*   Iron Studies: No results for input(s): "IRON", "TIBC", "TRANSFERRIN", "FERRITIN" in the last 72 hours. Lab Results  Component Value Date   INR 1.3 (H) 12/15/2020   INR 1.1 09/07/2019   INR 1.4 (H) 08/27/2019   Studies/Results: DG Chest Port 1 View  Result Date: 04/09/2022 CLINICAL DATA:  A 66 year old male presents for evaluation of dialysis catheter placement. EXAM: PORTABLE CHEST 1 VIEW COMPARISON:  February 24, 2022. FINDINGS: RIGHT IJ dialysis catheter has been placed tip in the area of the mid superior vena cava. Post median sternotomy for CABG. Cardiomediastinal contours and hilar structures are stable. EKG leads project over the chest. No signs of lobar consolidation though there is subtle retrocardiac airspace disease that has developed since previous imaging. No pneumothorax. No gross pleural effusion. On limited assessment there is no acute skeletal process. IMPRESSION: 1. Interval placement of RIGHT IJ dialysis catheter with tip in the area of the mid superior vena cava. 2. Subtle retrocardiac airspace disease, potentially atelectasis. Correlate with any signs of  infection. Electronically Signed   By: Zetta Bills M.D.   On: 04/09/2022 09:48   DG C-Arm 1-60 Min-No Report  Result Date: 04/09/2022 Fluoroscopy was utilized by the requesting physician.  No radiographic interpretation.    Medications:   Chlorhexidine Gluconate Cloth  6 each Topical Q0600   heparin sodium (porcine)        Dialysis Orders: [facility] MWF 3h 83min 400/500  2/2 bath  Hep none  new RIJ TDC/ LUA AVF (plicated)  Assessment/Plan: S/P plication LUA AVF and new  RIJ TDC placed 7/27 by Dr. Stanford Breed. Informed by HD staff of difficulty running HD today. Machine kept alarming. Reversed lines and still unsuccessful. Treatment ended early in 3 hrs and removed 1.6L. Also informed of no blood return from the arterial port. I spoke with Dr. Haskel Khan CXR, [facility] is in good position and cannot be pulled back further. Patient more likely showing signs for volume depletion. Bps were dropping during today's treatment (200cc fluids given) and JV was flat in the OR. Will review weights and HD prescription-more likely will raise his EDW and/or will run him even. Plan to watch this closely in outpatient on Monday. If still having issues, will send back to [facility] for [facility] exchange. ESRD - last HD yesterday. Plan for HD 7/31 outpatient. See above for plan.  HTN/ vol - BP's high normal and hx large gains in outpatient. Patient not showing signs for volume overload. Concern patient is showing signs for volume depletion: low Bps during tx today and flat JV (per VVS). Monitor BP trend, will need to ensure BP meds are given after HD.  Anemia esrd - Hb 12- 13 here. No esa needs.  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 Cont phoslo as binder. Get OP records.  CAD hx of CABG (2017) IDDM - per pmd PAF - on eliquis, per pmd HFpEF - last echo here in nov 2022 w/ LVEF 60-65%  Tobie Poet, NP [place] 04/10/2022,1:34 PM  LOS: 1 day

## 2022-04-10 NOTE — Telephone Encounter (Signed)
PCP completed forms Forms faxed to [facility] of [place]--DHB  [phone]. Received fax confirmation.

## 2022-04-10 NOTE — Progress Notes (Signed)
Pt recovered alert uf remains paused

## 2022-04-10 NOTE — Progress Notes (Addendum)
Order received to discharge patient.  Telemetry monitor removed and CCMD notified.  PIV access removed.  Discharge instructions, follow up, medications and instructions for their use discussed with patient. 

## 2022-04-10 NOTE — Progress Notes (Signed)
R IJ DC having multiple alarms attemtped flushing both ports arterial port unable to receive blood return, tx terminated due to alarming and clotting risks NP notified will consult IR, will report to primary RN

## 2022-04-10 NOTE — Progress Notes (Signed)
Pt < LOC uf paused given 200 ml NS

## 2022-04-10 NOTE — Progress Notes (Signed)
Pt stable alert uf resumed ufg < 3.5L

## 2022-04-10 NOTE — Progress Notes (Addendum)
Received patient in bed, alert and oriented. Informed consent signed and in chart.  Tx duration: 3hrs  HD treatment completed. Patient tolerated well. Fistula/Graft/HD catheter without signs and symptoms of infection, arterial port unable to get blood return. Patient transported back to the room, alert and orient and in no acute distress. Report given to bedside RN.  Total UF removed:1.6L  Medication given:none  Post HD YT:[id number] see flowsheet  Post HD weight: unable to obtain

## 2022-04-10 NOTE — Progress Notes (Signed)
  Progress Note    04/10/2022 7:08 AM 1 Day Post-Op  Subjective:  seen in HD. No complaints   Vitals:   04/09/22 2346 04/10/22 0534  BP: (!) 161/74 (!) 153/60  Pulse: 65 63  Resp: 17 18  Temp: 98.1 F (36.7 C) 98.2 F (36.8 C)  SpO2: 99% 99%   Physical Exam: Cardiac:  regular Lungs:  non labored Incisions:  left arm dressings intact and dry Extremities:  2+ left radial pulse, left hand warm and well perfused Neurologic: alert and oriented  CBC    Component Value Date/Time   WBC 3.6 (L) 03/31/2022 1121   RBC 3.94 (L) 03/31/2022 1121   HGB 13.3 04/09/2022 0637   HCT 39.0 04/09/2022 0637   PLT 109.0 (L) 03/31/2022 1121   MCV 96.1 03/31/2022 1121   MCH 31.3 02/26/2022 0208   MCHC 33.1 03/31/2022 1121   RDW 18.2 (H) 03/31/2022 1121   LYMPHSABS 0.8 02/24/2022 1243   MONOABS 0.8 02/24/2022 1243   EOSABS 0.2 02/24/2022 1243   BASOSABS 0.0 02/24/2022 1243    BMET    Component Value Date/Time   NA 133 (L) 04/09/2022 0637   K 4.6 04/09/2022 0637   CL 90 (L) 04/09/2022 0637   CO2 30 03/31/2022 1121   GLUCOSE 200 (H) 04/09/2022 0637   BUN 26 (H) 04/09/2022 0637   CREATININE 6.20 (H) 04/09/2022 0637   CALCIUM 8.5 03/31/2022 1121   GFRNONAA 11 (L) 02/26/2022 0208   GFRAA 8 (L) 12/12/2019 1010    INR    Component Value Date/Time   INR 1.3 (H) 12/15/2020 1636     Intake/Output Summary (Last 24 hours) at 04/10/2022 0708 Last data filed at 04/09/2022 1700 Gross per 24 hour  Intake --  Output 0 ml  Net 0 ml     Assessment/Plan:  66 y.o. male is s/p 1) Ultrasound and fluoroscopic guided placement of right internal jugular tunneled dialysis catheter (46EV) 2) plication of left arm arteriovenous fistula 1 Day Post-Op   Left arm dressings c/d/I. No signs or symptoms of steal He can discharge after HD this morning  He will have follow up in 2-3 weeks in our office for incision check    Karoline Caldwell, PA-C [facility] [phone] 04/10/2022 7:08 AM

## 2022-04-11 LAB — HEPATITIS B SURFACE ANTIBODY, QUANTITATIVE: Hep B S AB Quant (Post): 853.3 m[IU]/mL (ref >9.9)

## 2022-04-13 NOTE — Discharge Summary (Signed)
Discharge Summary  Patient ID: Kenneth [name] [id number] 65 y.o. [birth year]  Admit date: 04/09/2022  Discharge date and time: 04/10/2022  4:51 PM   Admitting Physician: Cherre Robins, MD   Discharge Physician: Jamelle Haring, MD  Admission Diagnoses: ESRD (end stage renal disease) [facility]) [N18.6]  Discharge Diagnoses: ESRD (end stage renal disease) ([facility]) [N18.6]  Admission Condition: fair  Discharged Condition: fair  Indication for Admission: Kenneth [name] is a 66 y.o. 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 After careful discussion of risks, benefits, and alternatives the patient was offered revision of left arm fistula with placement of right internal jugular tunneled dialysis catheter. The patient understood and wished to proceed.    Hospital Course: Kenneth [name] was admitted on 7/27 and underwent 1) Ultrasound and fluoroscopic guided placement of right internal jugular tunneled dialysis catheter (51WC) 2) plication of left arm arteriovenous fistula. He tolerated the procedure well and was taken to the recovery room in stable condition.  He had some post operative oozing from his incision and his family was concerned regarding patient going home so he was admitted for overnight observation.  Patient did well overnight. He received inpatient hemodialysis POD#1. His dressings remained clean, dry and intact. His pain well controlled. His Kenneth [name] worked well for HD. He remained stable for discharge home. PDMP was reviewed and post operative pain medication was sent to patients pharmacy. Follow up arranged in 2-3 weeks in the office for incision check.   Consults: nephrology  Treatments: dialysis: Hemodialysis and surgery: 1) Ultrasound and fluoroscopic guided placement of right internal jugular tunneled dialysis catheter (58NI) 2) plication of left arm arteriovenous fistula  Disposition: Discharge disposition: 01-Home or Self  Care       Patient Instructions:  Allergies as of 04/10/2022   No Known Allergies      Medication List     TAKE these medications    atorvastatin 80 MG tablet Commonly known as: LIPITOR Take 1 tablet (80 mg total) by mouth daily.   calcium acetate 667 MG capsule Commonly known as: PHOSLO Take 667 mg by mouth 3 (three) times daily with meals.   carvedilol 6.25 MG tablet Commonly known as: COREG Take 1 tablet (6.25 mg total) by mouth 2 (two) times daily with a meal. Hold on the mornings of dialysis.   diclofenac Sodium 1 % Gel Commonly known as: Voltaren Apply 4 grams topically 4 (four) times daily.   Eliquis 5 MG Tabs tablet Generic drug: apixaban Take 1 tablet by mouth 2 times daily   ezetimibe 10 MG tablet Commonly known as: ZETIA Take 1 tablet by mouth daily   FreeStyle Libre 2 Sensor Misc 1 Device by Does not apply route every 14 (fourteen) days.   hydrALAZINE 100 MG tablet Commonly known as: APRESOLINE Take 1 tablet (100 mg total) by mouth 3 (three) times daily. DO NOT TAKE AM AND NOON DOSES ON HD DAYS   isosorbide mononitrate 60 MG 24 hr tablet Commonly known as: IMDUR Take 1&1/2 tablets (90 mg total) by mouth daily. Hold on days of dialysis on Mon, Wed, Fri.   levETIRAcetam 500 MG tablet Commonly known as: KEPPRA Take 1 tablet (500 mg total) by mouth at bedtime.   levETIRAcetam 250 MG tablet Commonly known as: KEPPRA Take 1 tablet (250 mg total) by mouth every Monday, Wednesday, and Friday at 8 PM.   LOKELMA PO Take 1 packet by mouth 4 (four) times a week. Hold on  dialysis days   Lyumjev 100 UNIT/ML Soln Generic drug: Insulin Lispro-aabc Max Daily 30 units per insulin pump   megestrol 625 MG/5ML suspension Commonly known as: Megace ES Take 5 mLs (625 mg total) by mouth daily.   MIRCERA IJ Inject as directed.   OneTouch Verio test strip Generic drug: glucose blood 1 each by Other route in the morning, at noon, in the evening, and at  bedtime. Use as instructed   oxyCODONE-acetaminophen 5-325 MG tablet Commonly known as: Percocet Take 1 tablet by mouth every 6 (six) hours as needed for severe pain.   pantoprazole 40 MG tablet Commonly known as: PROTONIX Take 1 tablet (40 mg total) by mouth 2 (two) times daily.   sertraline 50 MG tablet Commonly known as: Zoloft Take 1 tablet (50 mg total) by mouth daily.   Stiolto Respimat 2.5-2.5 MCG/ACT Aers Generic drug: Tiotropium Bromide-Olodaterol Inhale 2 puffs by mouth into the lungs daily.   TechLite Pen Needles 32G X 6 MM Misc Generic drug: Insulin Pen Needle Use as directed 3 times daily               Discharge Care Instructions  (From admission, onward)           Start     Ordered   04/10/22 0000  Discharge wound care:       Comments: Leave dressings on for 24 hours. Once you take dressings down wash with mild soap and water, pat dry. Dont soak in bathtub   04/10/22 0714   04/09/22 0000  Discharge wound care:       Comments: Leave dry for 24 hours. Wash with mild soap and water, pat dry. Do not soak in bathtub   04/09/22 0919           Activity: activity as tolerated, no driving while on analgesics, and no heavy lifting for 6 weeks Diet: renal diet Wound Care:  keep clean and dry.Once dressings are taken down after 24 hours you can wash with mild soap and water, pat dry. Do not soak in bathtub  Follow-up with Dr. Stanford [name] in  2-3   weeks.  SignedKaroline Caldwell, PA-C 04/13/2022 3:15 PM [facility] Office: [phone]

## 2022-04-14 ENCOUNTER — Other Ambulatory Visit (HOSPITAL_BASED_OUTPATIENT_CLINIC_OR_DEPARTMENT_OTHER): Payer: Self-pay

## 2022-04-14 ENCOUNTER — Other Ambulatory Visit: Payer: Self-pay | Admitting: Family Medicine

## 2022-04-14 MED ORDER — LEVETIRACETAM 500 MG PO TABS
500.0000 mg | ORAL_TABLET | Freq: Every day | ORAL | 0 refills | Status: DC
  Filled 2022-04-14 – 2022-04-16 (×2): qty 30, 30d supply, fill #0

## 2022-04-14 MED ORDER — LEVETIRACETAM 250 MG PO TABS
250.0000 mg | ORAL_TABLET | ORAL | 0 refills | Status: DC
  Filled 2022-04-14: qty 12, 28d supply, fill #0

## 2022-04-15 ENCOUNTER — Telehealth: Payer: Self-pay | Admitting: *Deleted

## 2022-04-15 ENCOUNTER — Encounter (HOSPITAL_COMMUNITY): Payer: Self-pay

## 2022-04-15 ENCOUNTER — Emergency Department (HOSPITAL_COMMUNITY): Payer: Medicare Other

## 2022-04-15 ENCOUNTER — Observation Stay (HOSPITAL_COMMUNITY)
Admission: EM | Admit: 2022-04-15 | Discharge: 2022-04-17 | Disposition: A | Discharge status: Home / Self Care | Payer: Medicare Other | Attending: Family Medicine | Admitting: Family Medicine

## 2022-04-15 ENCOUNTER — Other Ambulatory Visit: Payer: Self-pay

## 2022-04-15 DIAGNOSIS — D649 Anemia, unspecified: Secondary | ICD-10-CM | POA: Diagnosis present

## 2022-04-15 DIAGNOSIS — T82598A Other mechanical complication of other cardiac and vascular devices and implants, initial encounter: Secondary | ICD-10-CM | POA: Diagnosis not present

## 2022-04-15 DIAGNOSIS — I119 Hypertensive heart disease without heart failure: Secondary | ICD-10-CM | POA: Diagnosis present

## 2022-04-15 DIAGNOSIS — Z20822 Contact with and (suspected) exposure to covid-19: Secondary | ICD-10-CM | POA: Diagnosis not present

## 2022-04-15 DIAGNOSIS — K219 Gastro-esophageal reflux disease without esophagitis: Secondary | ICD-10-CM | POA: Diagnosis present

## 2022-04-15 DIAGNOSIS — J439 Emphysema, unspecified: Secondary | ICD-10-CM | POA: Insufficient documentation

## 2022-04-15 DIAGNOSIS — Z951 Presence of aortocoronary bypass graft: Secondary | ICD-10-CM | POA: Diagnosis not present

## 2022-04-15 DIAGNOSIS — I2510 Atherosclerotic heart disease of native coronary artery without angina pectoris: Secondary | ICD-10-CM | POA: Insufficient documentation

## 2022-04-15 DIAGNOSIS — Z79899 Other long term (current) drug therapy: Secondary | ICD-10-CM | POA: Insufficient documentation

## 2022-04-15 DIAGNOSIS — R778 Other specified abnormalities of plasma proteins: Secondary | ICD-10-CM | POA: Diagnosis not present

## 2022-04-15 DIAGNOSIS — Z87891 Personal history of nicotine dependence: Secondary | ICD-10-CM | POA: Diagnosis not present

## 2022-04-15 DIAGNOSIS — E1122 Type 2 diabetes mellitus with diabetic chronic kidney disease: Secondary | ICD-10-CM | POA: Insufficient documentation

## 2022-04-15 DIAGNOSIS — Z955 Presence of coronary angioplasty implant and graft: Secondary | ICD-10-CM | POA: Insufficient documentation

## 2022-04-15 DIAGNOSIS — I132 Hypertensive heart and chronic kidney disease with heart failure and with stage 5 chronic kidney disease, or end stage renal disease: Secondary | ICD-10-CM | POA: Diagnosis not present

## 2022-04-15 DIAGNOSIS — Z794 Long term (current) use of insulin: Secondary | ICD-10-CM | POA: Insufficient documentation

## 2022-04-15 DIAGNOSIS — I5032 Chronic diastolic (congestive) heart failure: Secondary | ICD-10-CM | POA: Diagnosis not present

## 2022-04-15 DIAGNOSIS — Z7901 Long term (current) use of anticoagulants: Secondary | ICD-10-CM | POA: Diagnosis not present

## 2022-04-15 DIAGNOSIS — Z992 Dependence on renal dialysis: Secondary | ICD-10-CM | POA: Diagnosis not present

## 2022-04-15 DIAGNOSIS — Y712 Prosthetic and other implants, materials and accessory cardiovascular devices associated with adverse incidents: Secondary | ICD-10-CM | POA: Insufficient documentation

## 2022-04-15 DIAGNOSIS — N186 End stage renal disease: Secondary | ICD-10-CM | POA: Insufficient documentation

## 2022-04-15 DIAGNOSIS — I480 Paroxysmal atrial fibrillation: Secondary | ICD-10-CM | POA: Diagnosis not present

## 2022-04-15 DIAGNOSIS — I951 Orthostatic hypotension: Secondary | ICD-10-CM | POA: Diagnosis not present

## 2022-04-15 DIAGNOSIS — T82838A Hemorrhage of vascular prosthetic devices, implants and grafts, initial encounter: Secondary | ICD-10-CM | POA: Diagnosis not present

## 2022-04-15 DIAGNOSIS — N185 Chronic kidney disease, stage 5: Secondary | ICD-10-CM

## 2022-04-15 DIAGNOSIS — R69 Illness, unspecified: Secondary | ICD-10-CM

## 2022-04-15 DIAGNOSIS — R531 Weakness: Secondary | ICD-10-CM | POA: Diagnosis not present

## 2022-04-15 DIAGNOSIS — D631 Anemia in chronic kidney disease: Secondary | ICD-10-CM | POA: Insufficient documentation

## 2022-04-15 DIAGNOSIS — R42 Dizziness and giddiness: Secondary | ICD-10-CM | POA: Diagnosis present

## 2022-04-15 DIAGNOSIS — T82898A Other specified complication of vascular prosthetic devices, implants and grafts, initial encounter: Secondary | ICD-10-CM

## 2022-04-15 DIAGNOSIS — T829XXA Unspecified complication of cardiac and vascular prosthetic device, implant and graft, initial encounter: Secondary | ICD-10-CM | POA: Diagnosis not present

## 2022-04-15 DIAGNOSIS — G40109 Localization-related (focal) (partial) symptomatic epilepsy and epileptic syndromes with simple partial seizures, not intractable, without status epilepticus: Secondary | ICD-10-CM

## 2022-04-15 DIAGNOSIS — I25118 Atherosclerotic heart disease of native coronary artery with other forms of angina pectoris: Secondary | ICD-10-CM | POA: Diagnosis present

## 2022-04-15 LAB — COMPREHENSIVE METABOLIC PANEL
ALT: 7 U/L (ref 0–44)
AST: 29 U/L (ref 15–41)
Albumin: 3.3 g/dL — ABNORMAL LOW (ref 3.5–5.0)
Alkaline Phosphatase: 55 U/L (ref 38–126)
Anion gap: 9 (ref 5–15)
BUN: 17 mg/dL (ref 8–23)
CO2: 29 mmol/L (ref 22–32)
Calcium: 8.4 mg/dL — ABNORMAL LOW (ref 8.9–10.3)
Chloride: 97 mmol/L — ABNORMAL LOW (ref 98–111)
Creatinine, Ser: 5.02 mg/dL — ABNORMAL HIGH (ref 0.61–1.24)
GFR, Estimated: 12 mL/min — ABNORMAL LOW (ref >60)
Glucose, Bld: 167 mg/dL — ABNORMAL HIGH (ref 70–99)
Potassium: 4.1 mmol/L (ref 3.5–5.1)
Sodium: 135 mmol/L (ref 135–145)
Total Bilirubin: 0.9 mg/dL (ref 0.3–1.2)
Total Protein: 7.2 g/dL (ref 6.5–8.1)

## 2022-04-15 LAB — CBC
HCT: 31.8 % — ABNORMAL LOW (ref 39.0–52.0)
Hemoglobin: 10.7 g/dL — ABNORMAL LOW (ref 13.0–17.0)
MCH: 30.7 pg (ref 26.0–34.0)
MCHC: 33.6 g/dL (ref 30.0–36.0)
MCV: 91.4 fL (ref 80.0–100.0)
Platelets: 119 K/uL — ABNORMAL LOW (ref 150–400)
RBC: 3.48 MIL/uL — ABNORMAL LOW (ref 4.22–5.81)
RDW: 14.3 % (ref 11.5–15.5)
WBC: 4.5 K/uL (ref 4.0–10.5)
nRBC: 0 % (ref 0.0–0.2)

## 2022-04-15 LAB — CBC WITH DIFFERENTIAL/PLATELET
Abs Immature Granulocytes: 0.02 10*3/uL (ref 0.00–0.07)
Basophils Absolute: 0 10*3/uL (ref 0.0–0.1)
Basophils Relative: 1 %
Eosinophils Absolute: 0.2 10*3/uL (ref 0.0–0.5)
Eosinophils Relative: 4 %
HCT: 31.7 % — ABNORMAL LOW (ref 39.0–52.0)
Hemoglobin: 10.7 g/dL — ABNORMAL LOW (ref 13.0–17.0)
Immature Granulocytes: 0 %
Lymphocytes Relative: 19 %
Lymphs Abs: 1 10*3/uL (ref 0.7–4.0)
MCH: 30.7 pg (ref 26.0–34.0)
MCHC: 33.8 g/dL (ref 30.0–36.0)
MCV: 91.1 fL (ref 80.0–100.0)
Monocytes Absolute: 0.6 10*3/uL (ref 0.1–1.0)
Monocytes Relative: 12 %
Neutro Abs: 3.6 10*3/uL (ref 1.7–7.7)
Neutrophils Relative %: 64 %
Platelets: 116 10*3/uL — ABNORMAL LOW (ref 150–400)
RBC: 3.48 MIL/uL — ABNORMAL LOW (ref 4.22–5.81)
RDW: 14 % (ref 11.5–15.5)
WBC: 5.5 10*3/uL (ref 4.0–10.5)
nRBC: 0 % (ref 0.0–0.2)

## 2022-04-15 LAB — RESP PANEL BY RT-PCR (FLU A&B, COVID) ARPGX2
Influenza A by PCR: NEGATIVE
Influenza B by PCR: NEGATIVE
SARS Coronavirus 2 by RT PCR: NEGATIVE

## 2022-04-15 LAB — ETHANOL: Alcohol, Ethyl (B): <10 mg/dL (ref <10)

## 2022-04-15 LAB — GLUCOSE, CAPILLARY: Glucose-Capillary: 271 mg/dL — ABNORMAL HIGH (ref 70–99)

## 2022-04-15 LAB — TROPONIN I (HIGH SENSITIVITY)
Troponin I (High Sensitivity): 153 ng/L (ref <18)
Troponin I (High Sensitivity): 147 ng/L (ref <18)

## 2022-04-15 LAB — PROTIME-INR
INR: 1 (ref 0.8–1.2)
Prothrombin Time: 13.3 seconds (ref 11.4–15.2)

## 2022-04-15 LAB — LACTIC ACID, PLASMA
Lactic Acid, Venous: 2 mmol/L (ref 0.5–1.9)
Lactic Acid, Venous: 1.9 mmol/L (ref 0.5–1.9)

## 2022-04-15 MED ORDER — SODIUM CHLORIDE 0.9% FLUSH: 3.0000 mL | INTRAVENOUS | Status: DC | PRN

## 2022-04-15 MED ORDER — EZETIMIBE 10 MG PO TABS
10.0000 mg | ORAL_TABLET | Freq: Every day | ORAL | Status: DC
Start: 2022-04-15 — End: 2022-04-15

## 2022-04-15 MED ORDER — ATORVASTATIN CALCIUM 80 MG PO TABS
80.0000 mg | ORAL_TABLET | Freq: Every day | ORAL | Status: DC
  Administered 2022-04-15: 80 mg via ORAL
  Filled 2022-04-15: qty 1

## 2022-04-15 MED ORDER — LEVETIRACETAM 500 MG PO TABS
500.0000 mg | ORAL_TABLET | Freq: Every day | ORAL | Status: DC
  Administered 2022-04-15 – 2022-04-16 (×2): 500 mg via ORAL
  Filled 2022-04-15 (×2): qty 1

## 2022-04-15 MED ORDER — SODIUM CHLORIDE 0.9 % IV SOLN: 250.0000 mL | INTRAVENOUS | Status: DC | PRN

## 2022-04-15 MED ORDER — EZETIMIBE 10 MG PO TABS: 10.0000 mg | ORAL_TABLET | Freq: Every day | ORAL | Status: DC

## 2022-04-15 MED ORDER — INSULIN ASPART 100 UNIT/ML IJ SOLN
0.0000 [IU] | Freq: Three times a day (TID) | INTRAMUSCULAR | Status: DC
  Administered 2022-04-15: 3 [IU] via SUBCUTANEOUS
  Administered 2022-04-16: 1 [IU] via SUBCUTANEOUS
  Administered 2022-04-16 – 2022-04-17 (×2): 2 [IU] via SUBCUTANEOUS

## 2022-04-15 MED ORDER — MEGESTROL ACETATE 625 MG/5ML PO SUSP: 625.0000 mg | Freq: Every day | ORAL | Status: DC

## 2022-04-15 MED ORDER — ACETAMINOPHEN 325 MG PO TABS: 650.0000 mg | ORAL_TABLET | Freq: Four times a day (QID) | ORAL | Status: DC | PRN

## 2022-04-15 MED ORDER — ACETAMINOPHEN 650 MG RE SUPP: 650.0000 mg | Freq: Four times a day (QID) | RECTAL | Status: DC | PRN

## 2022-04-15 MED ORDER — UMECLIDINIUM BROMIDE 62.5 MCG/ACT IN AEPB
1.0000 | INHALATION_SPRAY | Freq: Every day | RESPIRATORY_TRACT | Status: DC
  Administered 2022-04-16 – 2022-04-17 (×2): 1 via RESPIRATORY_TRACT
  Filled 2022-04-15: qty 7

## 2022-04-15 MED ORDER — CARVEDILOL 6.25 MG PO TABS
6.2500 mg | ORAL_TABLET | ORAL | Status: DC
  Administered 2022-04-16: 6.25 mg via ORAL
  Filled 2022-04-15: qty 1

## 2022-04-15 MED ORDER — CALCIUM ACETATE (PHOS BINDER) 667 MG PO CAPS
667.0000 mg | ORAL_CAPSULE | Freq: Three times a day (TID) | ORAL | Status: DC
  Administered 2022-04-16 – 2022-04-17 (×2): 667 mg via ORAL
  Filled 2022-04-15 (×2): qty 1

## 2022-04-15 MED ORDER — SODIUM CHLORIDE 0.9% FLUSH
3.0000 mL | Freq: Two times a day (BID) | INTRAVENOUS | Status: DC
  Administered 2022-04-15: 3 mL via INTRAVENOUS

## 2022-04-15 MED ORDER — CARVEDILOL 6.25 MG PO TABS: 6.2500 mg | ORAL_TABLET | Freq: Two times a day (BID) | ORAL | Status: DC

## 2022-04-15 MED ORDER — LEVETIRACETAM 250 MG PO TABS
250.0000 mg | ORAL_TABLET | ORAL | Status: DC
  Administered 2022-04-15: 250 mg via ORAL
  Filled 2022-04-15 (×3): qty 1

## 2022-04-15 MED ORDER — MEGESTROL ACETATE 400 MG/10ML PO SUSP
400.0000 mg | Freq: Two times a day (BID) | ORAL | Status: DC
  Administered 2022-04-15 – 2022-04-16 (×2): 400 mg via ORAL
  Filled 2022-04-15 (×6): qty 10

## 2022-04-15 MED ORDER — PANTOPRAZOLE SODIUM 40 MG PO TBEC: 40.0000 mg | DELAYED_RELEASE_TABLET | Freq: Two times a day (BID) | ORAL | Status: DC

## 2022-04-15 MED ORDER — SODIUM ZIRCONIUM CYCLOSILICATE 5 G PO PACK
5.0000 g | PACK | ORAL | Status: DC
  Filled 2022-04-15: qty 1

## 2022-04-15 MED ORDER — ARFORMOTEROL TARTRATE 15 MCG/2ML IN NEBU
15.0000 ug | INHALATION_SOLUTION | Freq: Two times a day (BID) | RESPIRATORY_TRACT | Status: DC
  Administered 2022-04-15 – 2022-04-17 (×5): 15 ug via RESPIRATORY_TRACT
  Filled 2022-04-15 (×5): qty 2

## 2022-04-15 MED ORDER — CARVEDILOL 6.25 MG PO TABS
6.2500 mg | ORAL_TABLET | ORAL | Status: DC
  Administered 2022-04-15: 6.25 mg via ORAL
  Filled 2022-04-15 (×2): qty 1

## 2022-04-15 MED ORDER — SERTRALINE HCL 50 MG PO TABS
50.0000 mg | ORAL_TABLET | Freq: Every day | ORAL | Status: DC
  Administered 2022-04-15: 50 mg via ORAL
  Filled 2022-04-15: qty 1

## 2022-04-15 MED ORDER — PANTOPRAZOLE SODIUM 40 MG PO TBEC
40.0000 mg | DELAYED_RELEASE_TABLET | Freq: Two times a day (BID) | ORAL | Status: DC
  Administered 2022-04-16: 40 mg via ORAL
  Filled 2022-04-15: qty 1

## 2022-04-15 NOTE — ED Notes (Signed)
Report given to Carmelia Bake, RN. Orma Flaming MD at bedside. Pt to be transported to 72 after MD examines the pt.

## 2022-04-15 NOTE — ED Triage Notes (Signed)
Pt BIB [facility] from dialysis for eval of dizziness. EMS reports intermittent dizziness during treatment. Dizziness has since resolved. EMS reports RBBB on 12 lead.

## 2022-04-15 NOTE — ED Notes (Signed)
Pt noted to have dexcom in right upper ar. Pt BP cuff changed to q1hr.

## 2022-04-15 NOTE — Assessment & Plan Note (Addendum)
Most recent hemoglobin A1C of 9% from May 2023. Patient manages on an insulin pump as an outpatient, which is held this admission. Patient started on SSI. Continue outpatient management.

## 2022-04-15 NOTE — Assessment & Plan Note (Addendum)
Oozing from left arm fistula. Hemoglobin drifted down slightly. AV fistula explored in the OR by vascular surgery for revision. Eliquis held. Wound vacuum applied. Patient to discharge with a wound vacuum.

## 2022-04-15 NOTE — ED Notes (Addendum)
Pt OR [facility] ready at this time. OR aware of pt eating dinner.

## 2022-04-15 NOTE — ED Provider Notes (Signed)
[facility] EMERGENCY DEPARTMENT Provider Note   CSN: [id number] Arrival date & time: 04/15/22  1148     History  Chief Complaint  Patient presents with   Dizziness    Kenneth [name] is a 66 y.o. male.  HPI Patient has been feeling weak and dizzy for several days.  Patient reports he felt extremely fatigued.  He also reports he has had somewhat of a general headache.  He has been having some pain at the tunneled dialysis catheter site.  No fevers and no vomiting.  Patient's wife reports that she is concerned because he has had some bleeding from his repaired left AV fistula which was aneurysmal and dilated.  On 7\27 right IJ tunneled dialysis catheter was placed as well as plication of the AV fistula.  Due to some oozing of blood, the patient was observed overnight. The patient went to his dialysis today.  They changed dressings and there was some continued bleeding from the AV fistula repair.  Also noted that at dialysis they were unable to flush the tunneled dialysis catheter.  Patient was referred to the emergency department.    Home Medications Prior to Admission medications   Medication Sig Start Date End Date Taking? Authorizing Provider  apixaban (ELIQUIS) 5 MG TABS tablet Take 1 tablet by mouth 2 times daily 10/17/21   Larey Dresser, MD  atorvastatin (LIPITOR) 80 MG tablet Take 1 tablet (80 mg total) by mouth daily. 10/16/21   Larey Dresser, MD  calcium acetate (PHOSLO) 667 MG capsule Take 667 mg by mouth 3 (three) times daily with meals.  07/09/17   [provider]  carvedilol (COREG) 6.25 MG tablet Take 1 tablet (6.25 mg total) by mouth 2 (two) times daily with a meal. Hold on the mornings of dialysis. 02/10/22   Shelda Pal, DO  Continuous Blood Gluc Sensor (FREESTYLE LIBRE 2 SENSOR) MISC 1 Device by Does not apply route every 14 (fourteen) days. 10/28/21   Shamleffer, Melanie Crazier, MD  diclofenac Sodium (VOLTAREN) 1 % GEL Apply 4 grams  topically 4 (four) times daily. 09/16/21   Shelda Pal, DO  ezetimibe (ZETIA) 10 MG tablet Take 1 tablet by mouth daily 05/20/21   Larey Dresser, MD  glucose blood [facility] VERIO) test strip 1 each by Other route in the morning, at noon, in the evening, and at bedtime. Use as instructed 01/01/22   Shamleffer, Melanie Crazier, MD  hydrALAZINE (APRESOLINE) 100 MG tablet Take 1 tablet (100 mg total) by mouth 3 (three) times daily. DO NOT TAKE AM AND NOON DOSES ON HD DAYS 09/16/21   Shelda Pal, DO  Insulin Lispro-aabc (LYUMJEV) 100 UNIT/ML SOLN Max Daily 30 units per insulin pump 01/06/22   Shamleffer, Melanie Crazier, MD  Insulin Pen Needle (PEN NEEDLES) 32G X 6 MM MISC Use as directed 3 times daily 12/18/21   Shamleffer, Melanie Crazier, MD  isosorbide mononitrate (IMDUR) 60 MG 24 hr tablet Take 1&1/2 tablets (90 mg total) by mouth daily. Hold on days of dialysis on Mon, Wed, Fri. 02/10/22   Larey Dresser, MD  levETIRAcetam (KEPPRA) 250 MG tablet Take 1 tablet (250 mg total) by mouth every Monday, Wednesday, and Friday at 8 PM. 04/15/22   Nani Ravens, Crosby Oyster, DO  levETIRAcetam (KEPPRA) 500 MG tablet Take 1 tablet (500 mg total) by mouth at bedtime. 04/14/22   Shelda Pal, DO  megestrol (MEGACE ES) 625 MG/5ML suspension Take 5 mLs (625 mg total)  by mouth daily. 03/03/22   Shelda Pal, DO  Methoxy PEG-Epoetin Beta (MIRCERA IJ) Inject as directed.    [provider]  oxyCODONE-acetaminophen (PERCOCET) 5-325 MG tablet Take 1 tablet by mouth every 6 (six) hours as needed for severe pain. 04/09/22 04/09/23  Baglia, Corrina, PA-C  pantoprazole (PROTONIX) 40 MG tablet Take 1 tablet (40 mg total) by mouth 2 (two) times daily. 10/23/21   Hunsucker, Bonna Gains, MD  sertraline (ZOLOFT) 50 MG tablet Take 1 tablet (50 mg total) by mouth daily. 11/24/21   Shelda Pal, DO  Sodium Zirconium Cyclosilicate (LOKELMA PO) Take 1 packet by mouth 4 (four) times a week.  Hold on dialysis days    [provider]  Tiotropium Bromide-Olodaterol (STIOLTO RESPIMAT) 2.5-2.5 MCG/ACT AERS Inhale 2 puffs by mouth into the lungs daily. 01/20/22   Hunsucker, Bonna Gains, MD      Allergies    Patient has no known allergies.    Review of Systems   Review of Systems Constitutional: No fevers no chills positive for fatigue and malaise Respiratory: No increased shortness of breath.  No swelling in the legs.  Physical Exam Updated Vital Signs BP (!) 143/59   Pulse 62   Temp 98.5 F (36.9 C) (Oral)   Resp 16   Ht 5\' 5"  (1.651 m)   Wt 59.9 kg   SpO2 98%   BMI 21.97 kg/m  Physical Exam Constitutional:      Comments: Patient is resting and sleeping but awakens easily.  He does not have respiratory distress at rest.  HENT:     Head: Normocephalic and atraumatic.     Mouth/Throat:     Pharynx: Oropharynx is clear.  Eyes:     Extraocular Movements: Extraocular movements intact.  Cardiovascular:     Rate and Rhythm: Normal rate and regular rhythm.  Pulmonary:     Effort: Pulmonary effort is normal.     Breath sounds: Normal breath sounds.     Comments: Tunneled catheter in the right anterior chest wall.  No appearance of significant swelling bruising or drainage around it.  The dressing is clean and dry Abdominal:     General: There is no distension.     Palpations: Abdomen is soft.     Tenderness: There is no abdominal tenderness. There is no guarding.  Musculoskeletal:     Comments: Patient has a clean dressing on the left fistula repair site.  Just 1 small area of blood on the dressing.  No active bleeding.  Good pulses in the forearm.  Lower extremities without any peripheral edema calves are soft and pliable  Skin:    General: Skin is warm and dry.  Neurological:     General: No focal deficit present.     Mental Status: He is oriented to person, place, and time.     Coordination: Coordination normal.  Psychiatric:        Mood and Affect: Mood  normal.     ED Results / Procedures / Treatments   Labs (all labs ordered are listed, but only abnormal results are displayed) Labs Reviewed  COMPREHENSIVE METABOLIC PANEL - Abnormal; Notable for the following components:      Result Value   Chloride 97 (*)    Glucose, Bld 167 (*)    Creatinine, Ser 5.02 (*)    Calcium 8.4 (*)    Albumin 3.3 (*)    GFR, Estimated 12 (*)    All other components within normal limits  LACTIC ACID, PLASMA - Abnormal; Notable for the following components:   Lactic Acid, Venous 2.0 (*)    All other components within normal limits  CBC WITH DIFFERENTIAL/PLATELET - Abnormal; Notable for the following components:   RBC 3.48 (*)    Hemoglobin 10.7 (*)    HCT 31.7 (*)    Platelets 116 (*)    All other components within normal limits  TROPONIN I (HIGH SENSITIVITY) - Abnormal; Notable for the following components:   Troponin I (High Sensitivity) 153 (*)    All other components within normal limits  RESP PANEL BY RT-PCR (FLU A&B, COVID) ARPGX2  ETHANOL  PROTIME-INR  LACTIC ACID, PLASMA  TROPONIN I (HIGH SENSITIVITY)    EKG EKG Interpretation  Date/Time:  Wednesday April 15 2022 12:01:35 EDT Ventricular Rate:  65 PR Interval:  186 QRS Duration: 151 QT Interval:  537 QTC Calculation: 559 R Axis:   72 Text Interpretation: Sinus rhythm Right bundle branch block Repol abnrm suggests ischemia, lateral leads no sig change from previous Confirmed by Charlesetta Shanks [phone]) on 04/15/2022 12:06:28 PM  Radiology DG Chest Port 1 View  Result Date: 04/15/2022 CLINICAL DATA:  Weakness and dizziness. EXAM: PORTABLE CHEST 1 VIEW COMPARISON:  Chest radiograph 04/10/2019. FINDINGS: The right-sided dialysis catheter appears slightly retracted compared to the prior study with the tip in the mid SVC. There is a new mild kink in the catheter at the expected location of the skin surface. The cardiomediastinal silhouette is stable. There is no focal consolidation or  pulmonary edema. There is no pleural effusion or pneumothorax. Linear opacities in the left mid lung/lingula are unchanged and may reflect atelectasis or scar. There is no pleural effusion or pneumothorax There is no acute osseous abnormality. IMPRESSION: 1. No radiographic evidence of acute cardiopulmonary process. 2. The right-sided dialysis catheter appears slightly retracted compared to the prior study with the tip in the mid SVC. There is a new mild kink in the catheter at the expected location of the skin surface. Correlate with catheter function. Electronically Signed   By: Valetta Mole M.D.   On: 04/15/2022 13:27    Procedures Procedures    Medications Ordered in ED Medications - No data to display  ED Course/ Medical Decision Making/ A&P                           Medical Decision Making Amount and/or Complexity of Data Reviewed Labs: ordered. Radiology: ordered.  Risk Decision regarding hospitalization.   Patient presents as outlined.  Will obtain screening lab work, EKG, chest x-ray.  At rest patient's blood pressures are stable, he is not evidently hypotensive in supine position.  No active bleeding from the fistula repair at this time.  Clinically patient does not need emergent blood.  We will continue to monitor and evaluate diagnostic results  EKG consistent with prior tracings with right bundle branch block and repolarization abnormalities.  ST depression present laterally.  No STEMI.  Similar to older tracings.  Troponin is elevated but appears consistent with patient's baseline.  We will continue to trend troponins.  Patient does not have active chest pain.  Patient is orthostatic.  Likely hypovolemic with poor oral intake and dialysis.  Given his patient has normal blood pressures in supine and sitting will hold off on any significant fluid resuscitation.  Patient does not show evidence of hypoperfusion or mental status change.  Consult: Dr. Unk Lightning vascular surgery has  come down to  the emergency department and personally evaluated the patient.  Recommendations for admission to medical service and they will manage the patient's dialysis catheter which has clot present and and continue to monitor his wound which appears to be healing well.  Dr. Unk Lightning had remove the dressing and the wound is clean dry and intact without any active bleeding.  Consult: Reviewed with Dr. Eliberto Ivory [facility] hospitalist for admission.          Final Clinical Impression(s) / ED Diagnoses Final diagnoses:  General weakness  Complication associated with dialysis catheter  Severe comorbid illness    Rx / DC Orders ED Discharge Orders     None         Charlesetta Shanks, MD 04/15/22 1558

## 2022-04-15 NOTE — Telephone Encounter (Signed)
Nurse from dialysis called she states patient came in this am and has bleeding from his left arm site she states its bleeding a lot . She states incision has no derma bond and is bleeding through pressure dressing within 10 minutes. She also reports patients TDC is not functioning correctly she has tried repositioning and flushing it with no success. Instructed her to send patient to ER for evaluation of incision site and TDC function. She will instruct patient.

## 2022-04-15 NOTE — H&P (Addendum)
History and Physical    Patient: Kenneth [name] NFA:[id number] DOB: 1956-05-10 DOA: 04/15/2022 DOS: the patient was seen and examined on 04/15/2022 PCP: Shelda Pal, DO  Patient coming from: Home - lives with his wife. Coming from dialysis. Uses walker and cane at home.    Chief Complaint: bleeding AV fistula repair and not functioning TDC.  Translation service used for entirety of exam: [place].   HPI: Kenneth [name] is a 66 y.o. male with medical history significant of CAD, CHF, T2DM, HTN, HLD, ESRD on dialysis MWF, PAF, seizure disorder, depression who presented to ED from dialysis due to bleeding AV fistula site after repair and improper functioning of his TDC.  He was at dialysis today and they sent him to the ER because his fistula was bleeding. He also needs his [facility] catheter exchanged and vascular has been consulted. He reports he has been weak and dizzy since his procedure on 04/09/22. He is dizzy standing and just lying down.   Thursday 04/09/22 he had a left arm AV fistula repair and discharged on Friday. It continued to bleed at home over weekend. On Monday he had dialysis and his catheter was not working well. Today when they went to dialysis his catheter was not working well so they sent him to ED. His wife is not sure if he was able to have his full session of dialysis, but he was there for 4.5 hours today. Per chart nurse from dialysis made a note that he had a lot of bleeding from his left arm fistula. It was bleeding through pressure dressing in 10 minutes. She notes TDC not functioning properly either.   Denies any fever/chills, + headaches, denies any chest pain or palpitations, shortness of breath or cough, abdominal pain, N/V/D, dysuria or leg swelling.   He does not smoke or drink. Last took eliquis on 04/05/22 in the PM. Has not given since bleeding.   ER Course:  vitals: afebrile, bp: 127/69, HR: 65, RR: 15, oxygen: 98%RA Pertinent labs: creatinine: 5.02, lactic acid:  2.0, troponin: 153> Hgb: 10.7, platelets: 116,  CXR: no acute finding. The right-sided dialysis catheter appears slightly retracted compared to the prior study with the tip in the mid SVC. There is a new mild kink in the catheter at the expected location of the skin surface. Correlate with catheter function. In ED: TRH asked to admit.   Review of Systems: As mentioned in the history of present illness. All other systems reviewed and are negative. 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   type 1 DM   ESRD on hemodialysis ([place])    "MWF; [place]" (03/30/2018)   Gastroesophageal reflux disease 08/18/2016   Hyperlipidemia    Hypertension    Ischemic rest pain of lower extremity 02/06/2015   Keratoma 02/27/2015   Metatarsal deformity 02/27/2015   PAF (paroxysmal atrial fibrillation) (HCC)    Pronation deformity of both feet 02/27/2015   Past Surgical History:  Procedure Laterality Date   A/V FISTULAGRAM Left 03/06/2021   Procedure: A/V FISTULAGRAM;  Surgeon: Marty Heck, MD;  Location: [place] [facility];  Service: Cardiovascular;  Laterality: Left;   AV FISTULA  PLACEMENT Left 06/22/2016   Procedure: ARTERIOVENOUS (AV) FISTULA CREATION LEFT UPPER ARM;  Surgeon: Rosetta Posner, MD;  Location: [place];  Service: Vascular;  Laterality: Left;   BIOPSY  12/19/2020   Procedure: BIOPSY;  Surgeon: Ladene Artist, MD;  Location: [place];  Service: Endoscopy;;   CARDIAC CATHETERIZATION N/A 05/31/2016   Procedure: Left Heart Cath and Coronary Angiography;  Surgeon: Jettie Booze, MD;  Location: [place] [facility];  Service: Cardiovascular;  Laterality: N/A;   CARDIAC CATHETERIZATION N/A 05/31/2016    Procedure: Right Heart Cath;  Surgeon: Jettie Booze, MD;  Location: [place] [facility];  Service: Cardiovascular;  Laterality: N/A;   CARDIAC CATHETERIZATION N/A 05/31/2016   Procedure: IABP Insertion;  Surgeon: Jettie Booze, MD;  Location: [place] [facility];  Service: Cardiovascular;  Laterality: N/A;   COLONOSCOPY WITH PROPOFOL N/A 12/20/2020   Procedure: COLONOSCOPY WITH PROPOFOL;  Surgeon: Mauri Pole, MD;  Location: [place] [facility];  Service: Endoscopy;  Laterality: N/A;   CORONARY ARTERY BYPASS GRAFT N/A 06/05/2016   Procedure: CORONARY ARTERY BYPASS GRAFTING (CABG) x3 LIMA to LAD -SVG to OM -SVG to RCA;  Surgeon: Ivin Poot, MD;  Location: [place];  Service: Open Heart Surgery;  Laterality: N/A;   CORONARY STENT INTERVENTION N/A 07/07/2017   Procedure: CORONARY STENT INTERVENTION;  Surgeon: Wellington Hampshire, MD;  Location: [place] [facility];  Service: Cardiovascular;  Laterality: N/A;   CORONARY STENT INTERVENTION N/A 03/30/2018   Procedure: CORONARY STENT INTERVENTION;  Surgeon: Martinique, Peter M, MD;  Location: [place] [facility];  Service: Cardiovascular;  Laterality: N/A;   ESOPHAGOGASTRODUODENOSCOPY (EGD) WITH PROPOFOL N/A 12/19/2020   Procedure: ESOPHAGOGASTRODUODENOSCOPY (EGD) WITH PROPOFOL;  Surgeon: Ladene Artist, MD;  Location: [facility] ENDOSCOPY;  Service: Endoscopy;  Laterality: N/A;   GIVENS CAPSULE STUDY N/A 12/20/2020   Procedure: GIVENS CAPSULE STUDY;  Surgeon: Mauri Pole, MD;  Location: [place];  Service: Endoscopy;  Laterality: N/A;   INSERTION OF DIALYSIS CATHETER N/A 06/05/2016   Procedure: INSERTION OF DIALYSIS/trialysis CATHETER;  Surgeon: Ivin Poot, MD;  Location: [place];  Service: Vascular;  Laterality: N/A;   INSERTION OF DIALYSIS CATHETER Right 06/13/2016   Procedure: INSERTION OF DIALYSIS CATHETER RIGHT INTERNAL JUGULAR;  Surgeon: Conrad Tradewinds, MD;  Location: [place];  Service: Vascular;  Laterality: Right;   INSERTION OF DIALYSIS  CATHETER Right 09/07/2019   Procedure: Insertion Of Dialysis Catheter;  Surgeon: Rosetta Posner, MD;  Location: [facility] OR;  Service: Vascular;  Laterality: Right;   INSERTION OF DIALYSIS CATHETER N/A 04/09/2022   Procedure: INSERTION OF TUNNELED DIALYSIS CATHETER;  Surgeon: Cherre Robins, MD;  Location: [place];  Service: Vascular;  Laterality: N/A;   INTRAOPERATIVE TRANSESOPHAGEAL ECHOCARDIOGRAM N/A 06/05/2016   Procedure: INTRAOPERATIVE TRANSESOPHAGEAL ECHOCARDIOGRAM;  Surgeon: Ivin Poot, MD;  Location: [place];  Service: Open Heart Surgery;  Laterality: N/A;   LEFT HEART CATH AND CORS/GRAFTS ANGIOGRAPHY N/A 11/03/2016   Procedure: Left Heart Cath and Cors/Grafts Angiography;  Surgeon: Troy Sine, MD;  Location: [place] [facility];  Service: Cardiovascular;  Laterality: N/A;   LEFT HEART CATH AND CORS/GRAFTS ANGIOGRAPHY N/A 07/07/2017   Procedure: LEFT HEART CATH AND CORS/GRAFTS ANGIOGRAPHY;  Surgeon: Larey Dresser, MD;  Location: [place] [facility];  Service: Cardiovascular;  Laterality: N/A;   LEFT HEART CATH AND CORS/GRAFTS ANGIOGRAPHY N/A 03/30/2018   Procedure: LEFT HEART CATH AND CORS/GRAFTS ANGIOGRAPHY;  Surgeon: Martinique, Peter M, MD;  Location:  [place] IN[facility]IVE CV [facility];  Service: Cardiovascular;  Laterality: N/A;   LEFT HEART CATH AND CORS/GRAFTS ANGIOGRAPHY N/A 04/17/2019   Procedure: LEFT HEART CATH AND CORS/GRAFTS ANGIOGRAPHY;  Surgeon: Larey Dresser, MD;  Location: [place] [facility];  Service: Cardiovascular;  Laterality: N/A;   PERIPHERAL VASCULAR INTERVENTION Left 03/06/2021   Procedure: PERIPHERAL VASCULAR INTERVENTION;  Surgeon: Marty Heck, MD;  Location: [place] [facility];  Service: Cardiovascular;  Laterality: Left;   POLYPECTOMY  12/20/2020   Procedure: POLYPECTOMY;  Surgeon: Mauri Pole, MD;  Location: [place];  Service: Endoscopy;;   REVISION OF ARTERIOVENOUS GORETEX GRAFT Left 81/19/1478   Procedure: PLICATION OF ANEURYSM OF ARTERIOVENOUS FISTULA  LEFT  ARM;  Surgeon: Rosetta Posner, MD;  Location: [place];  Service: Vascular;  Laterality: Left;   REVISON OF ARTERIOVENOUS FISTULA Left 09/07/2019   Procedure: REVISON OF LEFT UPPER ARM ARTERIOVENOUS FISTULA;  Surgeon: Rosetta Posner, MD;  Location: [place];  Service: Vascular;  Laterality: Left;   REVISON OF ARTERIOVENOUS FISTULA Left 04/09/2022   Procedure: LEFT ARM FISTULA REVISION;  Surgeon: Cherre Robins, MD;  Location: [place];  Service: Vascular;  Laterality: Left;  PERIPHERAL NERVE BLOCK   RIGHT/LEFT HEART CATH AND CORONARY ANGIOGRAPHY N/A 01/03/2021   Procedure: RIGHT/LEFT HEART CATH AND CORONARY ANGIOGRAPHY;  Surgeon: Larey Dresser, MD;  Location: [place] [facility];  Service: Cardiovascular;  Laterality: N/A;   Social History:  reports that he quit smoking about 9 years ago. His smoking use included cigarettes. He has a 52.50 pack-year smoking history. He has never used smokeless tobacco. He reports that he does not drink alcohol and does not use drugs.  No Known Allergies  Family History  Problem Relation Age of Onset   CAD Father    Colon cancer Father    Diabetes Brother     Prior to Admission medications   Medication Sig Start Date End Date Taking? Authorizing Provider  apixaban (ELIQUIS) 5 MG TABS tablet Take 1 tablet by mouth 2 times daily 10/17/21   Larey Dresser, MD  atorvastatin (LIPITOR) 80 MG tablet Take 1 tablet (80 mg total) by mouth daily. 10/16/21   Larey Dresser, MD  calcium acetate (PHOSLO) 667 MG capsule Take 667 mg by mouth 3 (three) times daily with meals.  07/09/17   [provider]  carvedilol (COREG) 6.25 MG tablet Take 1 tablet (6.25 mg total) by mouth 2 (two) times daily with a meal. Hold on the mornings of dialysis. 02/10/22   Shelda Pal, DO  Continuous Blood Gluc Sensor (FREESTYLE LIBRE 2 SENSOR) MISC 1 Device by Does not apply route every 14 (fourteen) days. 10/28/21   Shamleffer, Melanie Crazier, MD  diclofenac Sodium (VOLTAREN) 1 %  GEL Apply 4 grams topically 4 (four) times daily. 09/16/21   Shelda Pal, DO  ezetimibe (ZETIA) 10 MG tablet Take 1 tablet by mouth daily 05/20/21   Larey Dresser, MD  glucose blood [facility] VERIO) test strip 1 each by Other route in the morning, at noon, in the evening, and at bedtime. Use as instructed 01/01/22   Shamleffer, Melanie Crazier, MD  hydrALAZINE (APRESOLINE) 100 MG tablet Take 1 tablet (100 mg total) by mouth 3 (three) times daily. DO NOT TAKE AM AND NOON DOSES ON HD DAYS 09/16/21   Shelda Pal, DO  Insulin Lispro-aabc (LYUMJEV) 100 UNIT/ML SOLN Max Daily 30 units per insulin pump 01/06/22   Shamleffer, Melanie Crazier, MD  Insulin  Pen Needle (PEN NEEDLES) 32G X 6 MM MISC Use as directed 3 times daily 12/18/21   Shamleffer, Melanie Crazier, MD  isosorbide mononitrate (IMDUR) 60 MG 24 hr tablet Take 1&1/2 tablets (90 mg total) by mouth daily. Hold on days of dialysis on Mon, Wed, Fri. 02/10/22   Larey Dresser, MD  levETIRAcetam (KEPPRA) 250 MG tablet Take 1 tablet (250 mg total) by mouth every Monday, Wednesday, and Friday at 8 PM. 04/15/22   Nani Ravens, Crosby Oyster, DO  levETIRAcetam (KEPPRA) 500 MG tablet Take 1 tablet (500 mg total) by mouth at bedtime. 04/14/22   Shelda Pal, DO  megestrol (MEGACE ES) 625 MG/5ML suspension Take 5 mLs (625 mg total) by mouth daily. 03/03/22   Shelda Pal, DO  Methoxy PEG-Epoetin Beta (MIRCERA IJ) Inject as directed.    [provider]  oxyCODONE-acetaminophen (PERCOCET) 5-325 MG tablet Take 1 tablet by mouth every 6 (six) hours as needed for severe pain. 04/09/22 04/09/23  Baglia, Corrina, PA-C  pantoprazole (PROTONIX) 40 MG tablet Take 1 tablet (40 mg total) by mouth 2 (two) times daily. 10/23/21   Hunsucker, Bonna Gains, MD  sertraline (ZOLOFT) 50 MG tablet Take 1 tablet (50 mg total) by mouth daily. 11/24/21   Shelda Pal, DO  Sodium Zirconium Cyclosilicate (LOKELMA PO) Take 1 packet by mouth 4  (four) times a week. Hold on dialysis days    [provider]  Tiotropium Bromide-Olodaterol (STIOLTO RESPIMAT) 2.5-2.5 MCG/ACT AERS Inhale 2 puffs by mouth into the lungs daily. 01/20/22   Hunsucker, Bonna Gains, MD    Physical Exam: Vitals:   04/15/22 1600 04/15/22 1630 04/15/22 1645 04/15/22 1710  BP: (!) 114/50   (!) 132/56  Pulse: 76 70 67 68  Resp: 17 17 19    Temp:    98.7 F (37.1 C)  TempSrc:    Oral  SpO2: 98% 97% 97%   Weight:      Height:       General:  Appears calm and comfortable and is in NAD. Sleeping, but wakes up for exam/questions  Eyes:  PERRL, EOMI, normal lids, iris ENT:  grossly normal hearing, lips & tongue, mmm; appropriate dentition Neck:  no LAD, masses or thyromegaly; no carotid bruits Cardiovascular:  RRR, +systolic murmur. No LE edema.  Respiratory:   CTA bilaterally with no wheezes/rales/rhonchi.  Normal respiratory effort. Abdomen:  soft, NT, ND, NABS Back:   normal alignment, no CVAT Skin:  no rash or induration seen on limited exam. Left AVF with wrap. TDC on right anterior chest wall.  Musculoskeletal:  grossly normal tone BUE/BLE, good ROM, no bony abnormality Lower extremity:  No LE edema.  Limited foot exam with no ulcerations.  2+ distal pulses. Psychiatric:  grossly normal mood and affect, speech fluent and appropriate, AOx3 Neurologic:  CN 2-12 grossly intact, moves all extremities in coordinated fashion, sensation intact   Radiological Exams on Admission: Independently reviewed - see discussion in A/P where applicable  DG Chest Port 1 View  Result Date: 04/15/2022 CLINICAL DATA:  Weakness and dizziness. EXAM: PORTABLE CHEST 1 VIEW COMPARISON:  Chest radiograph 04/10/2019. FINDINGS: The right-sided dialysis catheter appears slightly retracted compared to the prior study with the tip in the mid SVC. There is a new mild kink in the catheter at the expected location of the skin surface. The cardiomediastinal silhouette is stable. There is  no focal consolidation or pulmonary edema. There is no pleural effusion or pneumothorax. Linear opacities in the left mid  lung/lingula are unchanged and may reflect atelectasis or scar. There is no pleural effusion or pneumothorax There is no acute osseous abnormality. IMPRESSION: 1. No radiographic evidence of acute cardiopulmonary process. 2. The right-sided dialysis catheter appears slightly retracted compared to the prior study with the tip in the mid SVC. There is a new mild kink in the catheter at the expected location of the skin surface. Correlate with catheter function. Electronically Signed   By: Valetta Mole M.D.   On: 04/15/2022 13:27    EKG: Independently reviewed.  NSR with rate 65; nonspecific ST changes with no evidence of acute ischemia. RBBB. Similar to previous ekg    Labs on Admission: I have personally reviewed the available labs and imaging studies at the time of the admission.  Pertinent labs:   creatinine: 5.02,  lactic acid: 2.0,  troponin: 153>147 Hgb: 10.7,  platelets: 116,   Assessment and Plan: Principal Problem:   Complication associated with dialysis catheter Active Problems:   Complication of AV dialysis fistula   Orthostatic hypotension   Acute on chronic anemia   Atherosclerotic heart disease of native coronary artery with other forms of angina pectoris with elevated troponin    Elevated troponin   ESRD (end stage renal disease) on dialysis (HCC)   AF (paroxysmal atrial fibrillation) (HCC)   Chronic diastolic CHF (congestive heart failure) (HCC)   Type 2 diabetes mellitus with chronic kidney disease on chronic dialysis, with long-term current use of insulin (HCC)   Partial seizure disorder (HCC)   Gastroesophageal reflux disease   Pulmonary emphysema (HCC)    Assessment and Plan: * Complication associated with dialysis catheter 66 year old male with ESRD on dialysis presenting to ED after [facility] not functioning properly at dialysis Monday and  today -observation to telemetry -plan for [facility] exchange/repair tomorrow by vascular -CXR showing new mild kink in catheter at the expected location of the skin service.  -NPO at midnight -off eliquis since 7/23, continue to hold  -nephrology has also been consulted   Complication of AV dialysis fistula Hx of left arm AV fistula repair on 04/09/22. Per wife, it has been bleeding intermittently since that time.  hgb 13.3>10.7 over 2 weeks and symptomatic  Vascular surgery has examined and not bleeding on exam, healing appears healthy/appropriate. Will continue to monitor closely  Guaze/ace wrap in place  eliquis held   Orthostatic hypotension Patient with dizziness/lightheadedness and fatigue since Av fistual repair found to be significantly orthastatic with acute on chronic anemia likely the cause Fistula appears hemodynamically stable at this time Will place TED hose Hold imdur and hydralazine. Continue coreg only  Monitor CBC and transfuse if hgb <8 with cardiac history (preferably with dialysis if non emergent)  Daily orthostatic vitals   Acute on chronic anemia hgb 13.3>10.7 over 2 weeks and symptomatic due to bleeding fistula  hgb has been around 7-8 prior throughout June though EPO per renal  Follow cbc  Transfuse if hgb <8 with extensive cardiac history   Atherosclerotic heart disease of native coronary artery with other forms of angina pectoris with elevated troponin  Hx of nstemi in 05/2016 s/p CABG x3. Hx of NSTEMI in 10/18 s/p DES to ostial SVG to OM  Cath in 10/18: LM 40% mid to distal, LAD 70% prox and 90% mid. RCA occluded with collaterals.  He had repeat cath in 8/20 showing occluded SVG-ramus (new) but continued patency of the native high OM1 s/p stenting.  No target for intervention.  LHC again  in 4/22, no change from 8/20.  Troponin elevated today, but down trending since last year and delta continues to downtrend. No chest pain.  Continue high dose statin, coreg.   eliquis on hold with intermittent bleeding fistual and drop in hgb  Continue on telemetry   ESRD (end stage renal disease) on dialysis [facility]) Dialysis on MWF. At dialysis today, but unsure if he had full session since [facility] not functioning properly No need for urgent dialysis Nephrology consulted for possible dialysis tomorrow if needed after [facility] exchange/repair   AF (paroxysmal atrial fibrillation) ([place]) NSR, rate controlled eliquis on hold since 7/23 with bleeding fistula Continue to hold with surgery tomorrow   Chronic diastolic CHF (congestive heart failure) ([place]) Appears euvolemic Last echo: 07/2021-EF of 60-65% with hypokinesis of the basal inferior wall. Normal LVF. Grade 2 DD.  Strict I/O Volume control per dialysis   Type 2 diabetes mellitus with chronic kidney disease on chronic dialysis, with long-term current use of insulin (HCC) Poorly controlled. A1c in 01/2022: 9.0 SSI and accuchecks qac/hs  Held insulin pump daily insulin while NPO   Partial seizure disorder ([place]) Continue keppra Seizure precautions   Gastroesophageal reflux disease Continue protonix BID     Advance Care Planning:   Code Status: Full Code   Consults: vascular: Dr. Virl Cagey and nephrology: Dr. Carolin Sicks   DVT Prophylaxis: SCDs  Family Communication: wife at bedside: Samwise Eckardt   Severity of Illness: The appropriate patient status for this patient is OBSERVATION. Observation status is judged to be reasonable and necessary in order to provide the required intensity of service to ensure the patient's safety. [name] presenting symptoms, physical exam findings, and initial radiographic and laboratory data in the context of their medical condition is felt to place them at decreased risk for further clinical deterioration. Furthermore, it is anticipated that the patient will be medically stable for discharge from the hospital within 2 midnights of admission.   Author: Orma Flaming, MD 04/15/2022  8:11 PM  For on call review [email or web address].

## 2022-04-15 NOTE — Assessment & Plan Note (Signed)
Euvolemic. Stable. Fluid management per nephrology with HD.

## 2022-04-15 NOTE — Assessment & Plan Note (Signed)
Vascular surgery consulted for [facility] exchange which was successfully completed on 8/3

## 2022-04-15 NOTE — Assessment & Plan Note (Signed)
Dialysis on MWF. At dialysis today, but unsure if he had full session since [facility] not functioning properly No need for urgent dialysis Nephrology consulted for possible dialysis tomorrow if needed after [facility] exchange/repair

## 2022-04-15 NOTE — Assessment & Plan Note (Addendum)
Patient is on Coreg and Eliquis. Currently normal sinus rhythm with normal rate. Eliquis held secondary to oozing and procedure. Continue Coreg. Resume Eliquis in 4 days pending recommendation for repeat CBC on 8/7.

## 2022-04-15 NOTE — Assessment & Plan Note (Addendum)
Possibly related to worsened anemia vs antihypertensives while on hemodialysis. Symptoms appear improved.

## 2022-04-15 NOTE — Assessment & Plan Note (Addendum)
Continue Protonix °

## 2022-04-15 NOTE — Progress Notes (Signed)
[name] BRIEF NOTE  [id number] Kenneth [name] Nov 01, 1955   Patient presented to the ED due to bleeding from AVF and dizziness.  Review of outpatient record indicates patient sent to ED via EMS due to lethargy, disorientation and bleeding from AVF.  He completed his full treatment prior to being sent.  Of note catheter was not functioning properly and unable to maintain prescribed BFR throughout treatment.  Average BFR 300.  Epic chart reviewed.  Labs appropriate post dialysis.  Oxygenating well on RA.  CXR with no acute cardiopulmonary findings, and new kink in [facility] near skin surface.  OP HD orders:  MWF - AF 3.75hrs, 400/500 2K 2Ca P4 Sensipar 60mg  qHD Hectorol 34mcg qHD No Heparin  Plan: No acute indication for dialysis.   To have [place] exchanged tomorrow by [facility].  Will continue to follow patient and assess dialysis needs daily.  If changed to inpatient status will complete full consult.

## 2022-04-15 NOTE — ED Notes (Addendum)
Pt to go have dialysis catheter changed at 0930 on 04/16/22. Pt ate dinner this evening. Dr. Johnney Killian aware.

## 2022-04-15 NOTE — Assessment & Plan Note (Addendum)
History of CABG and stent placement. No current chest pain. Transfusion goal of >8. Continue Lipitor and Coreg.

## 2022-04-15 NOTE — ED Notes (Signed)
Transported pt to 43 via stretcher. Pt call bell in reach.pt placed on continuous monitoring. Family at bedside. Care endorsed to Amy K. RN

## 2022-04-15 NOTE — Assessment & Plan Note (Addendum)
Baseline hemoglobin of about 8. Hemoglobin has reached up to 13 recently and has drifted back down to 8.3. Patient is asymptomatic and currently without evidence of active bleeding. Will recommend repeat CBC at next hemodialysis prior to restarting Eliquis to ensure continued stability.

## 2022-04-15 NOTE — Assessment & Plan Note (Addendum)
Continue Keppra.

## 2022-04-15 NOTE — ED Notes (Signed)
Help get patient undressed into a gown on the monitor patient is resting with call bell in reach  

## 2022-04-15 NOTE — ED Notes (Signed)
Pt received to ER 17. Pt c/o dizziness and bleeding at fistula graft site. Pt reports having dialysis today. Pt reports dizziness upon standing up. Pt H&P completed. Pt on continuous monitoring.

## 2022-04-15 NOTE — Consult Note (Signed)
Hospital Consult    Reason for Consult: Bleeding AVF, malfunctioning [facility] Requesting Physician: ED MRN #:  [id number]  History of Present Illness: This is a 66 y.o. male status post left arm fistula revision, right IJ tunneled dialysis catheter placement last week.  Hospital course was uneventful and he was discharged.  He presents today with continued oozing from the left fistula incision, malfunctioning TDC, lethargy and dizziness.  On exam, Kenneth [name] was sitting comfortably.  He denied any bleeding episode since arriving to the hospital.  He described a slow ooze that had been constant.  No pulsatile bleeding.  He denied deficits in his hand, denied fevers chills, denies erythema, denies concern for infection.  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   type 1 DM   ESRD on hemodialysis ([place])    "MWF; [place]" (03/30/2018)   Gastroesophageal reflux disease 08/18/2016   Hyperlipidemia    Hypertension    Ischemic rest pain of lower extremity 02/06/2015   Keratoma 02/27/2015   Metatarsal deformity 02/27/2015   PAF (paroxysmal atrial fibrillation) (HCC)    Pronation deformity of both feet 02/27/2015    Past Surgical History:  Procedure Laterality Date   A/V FISTULAGRAM Left 03/06/2021   Procedure: A/V FISTULAGRAM;  Surgeon: Marty Heck, MD;  Location: [place] [facility];  Service: Cardiovascular;  Laterality: Left;   AV FISTULA PLACEMENT Left 06/22/2016   Procedure: ARTERIOVENOUS (AV) FISTULA CREATION LEFT UPPER ARM;  Surgeon: Rosetta Posner, MD;  Location: [place];  Service: Vascular;  Laterality: Left;   BIOPSY  12/19/2020   Procedure: BIOPSY;  Surgeon: Ladene Artist, MD;  Location: [place];   Service: Endoscopy;;   CARDIAC CATHETERIZATION N/A 05/31/2016   Procedure: Left Heart Cath and Coronary Angiography;  Surgeon: Jettie Booze, MD;  Location: [place] [facility];  Service: Cardiovascular;  Laterality: N/A;   CARDIAC CATHETERIZATION N/A 05/31/2016   Procedure: Right Heart Cath;  Surgeon: Jettie Booze, MD;  Location: [place] [facility];  Service: Cardiovascular;  Laterality: N/A;   CARDIAC CATHETERIZATION N/A 05/31/2016   Procedure: IABP Insertion;  Surgeon: Jettie Booze, MD;  Location: [place] [facility];  Service: Cardiovascular;  Laterality: N/A;   COLONOSCOPY WITH PROPOFOL N/A 12/20/2020   Procedure: COLONOSCOPY WITH PROPOFOL;  Surgeon: Mauri Pole, MD;  Location: [place] [facility];  Service: Endoscopy;  Laterality: N/A;   CORONARY ARTERY BYPASS GRAFT N/A 06/05/2016   Procedure: CORONARY ARTERY BYPASS GRAFTING (CABG) x3 LIMA to LAD -SVG to OM -SVG to RCA;  Surgeon: Ivin Poot, MD;  Location: [place];  Service: Open Heart Surgery;  Laterality: N/A;   CORONARY STENT INTERVENTION N/A 07/07/2017   Procedure: CORONARY STENT INTERVENTION;  Surgeon: Wellington Hampshire, MD;  Location: [place] [facility];  Service: Cardiovascular;  Laterality: N/A;   CORONARY STENT INTERVENTION N/A 03/30/2018   Procedure: CORONARY STENT INTERVENTION;  Surgeon: Martinique, Peter M, MD;  Location: [place] [facility];  Service: Cardiovascular;  Laterality: N/A;   ESOPHAGOGASTRODUODENOSCOPY (EGD) WITH PROPOFOL N/A 12/19/2020   Procedure: ESOPHAGOGASTRODUODENOSCOPY (EGD) WITH PROPOFOL;  Surgeon: Ladene Artist, MD;  Location[facility];  Service: Endoscopy;  Laterality: N/A;  GIVENS CAPSULE STUDY N/A 12/20/2020   Procedure: GIVENS CAPSULE STUDY;  Surgeon: Mauri Pole, MD;  Location: [place] [facility];  Service: Endoscopy;  Laterality: N/A;   INSERTION OF DIALYSIS CATHETER N/A 06/05/2016   Procedure: INSERTION OF DIALYSIS/trialysis CATHETER;  Surgeon: Ivin Poot, MD;  Location: [place];   Service: Vascular;  Laterality: N/A;   INSERTION OF DIALYSIS CATHETER Right 06/13/2016   Procedure: INSERTION OF DIALYSIS CATHETER RIGHT INTERNAL JUGULAR;  Surgeon: Conrad Palmas, MD;  Location: [place];  Service: Vascular;  Laterality: Right;   INSERTION OF DIALYSIS CATHETER Right 09/07/2019   Procedure: Insertion Of Dialysis Catheter;  Surgeon: Rosetta Posner, MD;  Location: [facility];  Service: Vascular;  Laterality: Right;   INSERTION OF DIALYSIS CATHETER N/A 04/09/2022   Procedure: INSERTION OF TUNNELED DIALYSIS CATHETER;  Surgeon: Cherre Maydelin Deming, MD;  Location: [place];  Service: Vascular;  Laterality: N/A;   INTRAOPERATIVE TRANSESOPHAGEAL ECHOCARDIOGRAM N/A 06/05/2016   Procedure: INTRAOPERATIVE TRANSESOPHAGEAL ECHOCARDIOGRAM;  Surgeon: Ivin Poot, MD;  Location: [place];  Service: Open Heart Surgery;  Laterality: N/A;   LEFT HEART CATH AND CORS/GRAFTS ANGIOGRAPHY N/A 11/03/2016   Procedure: Left Heart Cath and Cors/Grafts Angiography;  Surgeon: Troy Sine, MD;  Location: [place] [facility];  Service: Cardiovascular;  Laterality: N/A;   LEFT HEART CATH AND CORS/GRAFTS ANGIOGRAPHY N/A 07/07/2017   Procedure: LEFT HEART CATH AND CORS/GRAFTS ANGIOGRAPHY;  Surgeon: Larey Dresser, MD;  Location: [place] [facility];  Service: Cardiovascular;  Laterality: N/A;   LEFT HEART CATH AND CORS/GRAFTS ANGIOGRAPHY N/A 03/30/2018   Procedure: LEFT HEART CATH AND CORS/GRAFTS ANGIOGRAPHY;  Surgeon: Martinique, Peter M, MD;  Location: [place] [facility];  Service: Cardiovascular;  Laterality: N/A;   LEFT HEART CATH AND CORS/GRAFTS ANGIOGRAPHY N/A 04/17/2019   Procedure: LEFT HEART CATH AND CORS/GRAFTS ANGIOGRAPHY;  Surgeon: Larey Dresser, MD;  Location: [place] [facility];  Service: Cardiovascular;  Laterality: N/A;   PERIPHERAL VASCULAR INTERVENTION Left 03/06/2021   Procedure: PERIPHERAL VASCULAR INTERVENTION;  Surgeon: Marty Heck, MD;  Location: [place] [facility];  Service: Cardiovascular;  Laterality:  Left;   POLYPECTOMY  12/20/2020   Procedure: POLYPECTOMY;  Surgeon: Mauri Pole, MD;  Location: [place];  Service: Endoscopy;;   REVISION OF ARTERIOVENOUS GORETEX GRAFT Left 78/29/5621   Procedure: PLICATION OF ANEURYSM OF ARTERIOVENOUS FISTULA  LEFT ARM;  Surgeon: Rosetta Posner, MD;  Location: [place];  Service: Vascular;  Laterality: Left;   REVISON OF ARTERIOVENOUS FISTULA Left 09/07/2019   Procedure: REVISON OF LEFT UPPER ARM ARTERIOVENOUS FISTULA;  Surgeon: Rosetta Posner, MD;  Location: [place];  Service: Vascular;  Laterality: Left;   REVISON OF ARTERIOVENOUS FISTULA Left 04/09/2022   Procedure: LEFT ARM FISTULA REVISION;  Surgeon: Cherre Anays Detore, MD;  Location: [place];  Service: Vascular;  Laterality: Left;  PERIPHERAL NERVE BLOCK   RIGHT/LEFT HEART CATH AND CORONARY ANGIOGRAPHY N/A 01/03/2021   Procedure: RIGHT/LEFT HEART CATH AND CORONARY ANGIOGRAPHY;  Surgeon: Larey Dresser, MD;  Location: [place] [facility];  Service: Cardiovascular;  Laterality: N/A;    No Known Allergies  Prior to Admission medications   Medication Sig Start Date End Date Taking? Authorizing Provider  apixaban (ELIQUIS) 5 MG TABS tablet Take 1 tablet by mouth 2 times daily 10/17/21  Yes Larey Dresser, MD  atorvastatin (LIPITOR) 80 MG tablet Take 1 tablet (80 mg total) by mouth daily. 10/16/21  Yes Larey Dresser, MD  calcium  acetate (PHOSLO) 667 MG capsule Take 667 mg by mouth 3 (three) times daily with meals.  07/09/17  Yes [provider]  carvedilol (COREG) 6.25 MG tablet Take 1 tablet (6.25 mg total) by mouth 2 (two) times daily with a meal. Hold on the mornings of dialysis. 02/10/22  Yes Shelda Pal, DO  diclofenac Sodium (VOLTAREN) 1 % GEL Apply 4 grams topically 4 (four) times daily. 09/16/21  Yes Shelda Pal, DO  ezetimibe (ZETIA) 10 MG tablet Take 1 tablet by mouth daily 05/20/21  Yes Larey Dresser, MD  hydrALAZINE (APRESOLINE) 100 MG tablet Take 1 tablet (100 mg  total) by mouth 3 (three) times daily. DO NOT TAKE AM AND NOON DOSES ON HD DAYS 09/16/21  Yes Shelda Pal, DO  Insulin Lispro-aabc (LYUMJEV) 100 UNIT/ML SOLN Max Daily 30 units per insulin pump 01/06/22  Yes Shamleffer, Melanie Crazier, MD  isosorbide mononitrate (IMDUR) 60 MG 24 hr tablet Take 1&1/2 tablets (90 mg total) by mouth daily. Hold on days of dialysis on Mon, Wed, Fri. 02/10/22  Yes Larey Dresser, MD  levETIRAcetam (KEPPRA) 250 MG tablet Take 1 tablet (250 mg total) by mouth every Monday, Wednesday, and Friday at 8 PM. 04/15/22  Yes Wendling, Crosby Oyster, DO  levETIRAcetam (KEPPRA) 500 MG tablet Take 1 tablet (500 mg total) by mouth at bedtime. 04/14/22  Yes Shelda Pal, DO  megestrol (MEGACE ES) 625 MG/5ML suspension Take 5 mLs (625 mg total) by mouth daily. 03/03/22  Yes Wendling, Crosby Oyster, DO  Methoxy PEG-Epoetin Beta (MIRCERA IJ) Inject as directed.   Yes [provider]  pantoprazole (PROTONIX) 40 MG tablet Take 1 tablet (40 mg total) by mouth 2 (two) times daily. 10/23/21  Yes Hunsucker, Bonna Gains, MD  sertraline (ZOLOFT) 50 MG tablet Take 1 tablet (50 mg total) by mouth daily. 11/24/21  Yes Shelda Pal, DO  Sodium Zirconium Cyclosilicate (LOKELMA PO) Take 1 packet by mouth 4 (four) times a week. Hold on dialysis days   Yes [provider]  Tiotropium Bromide-Olodaterol (STIOLTO RESPIMAT) 2.5-2.5 MCG/ACT AERS Inhale 2 puffs by mouth into the lungs daily. 01/20/22  Yes Hunsucker, Bonna Gains, MD  Continuous Blood Gluc Sensor (FREESTYLE LIBRE 2 SENSOR) MISC 1 Device by Does not apply route every 14 (fourteen) days. 10/28/21   Shamleffer, Melanie Crazier, MD  glucose blood (ONETOUCH VERIO) test strip 1 each by Other route in the morning, at noon, in the evening, and at bedtime. Use as instructed 01/01/22   Shamleffer, Melanie Crazier, MD  Insulin Pen Needle (PEN NEEDLES) 32G X 6 MM MISC Use as directed 3 times daily 12/18/21   Shamleffer, Melanie Crazier, MD    Social History   Socioeconomic History   Marital status: Married    Spouse name: Not on file   Number of children: Not on file   Years of education: Not on file   Highest education level: Not on file  Occupational History   Not on file  Tobacco Use   Smoking status: Former    Packs/day: 1.50    Years: 35.00    Total pack years: 52.50    Types: Cigarettes    Quit date: 2014    Years since quitting: 9.5   Smokeless tobacco: Never   Tobacco comments:    "quit e-cigs ~ 2014"  Vaping Use   Vaping Use: Former  Substance and Sexual Activity   Alcohol use: Never    Alcohol/week: 0.0 standard drinks  of alcohol   Drug use: Never   Sexual activity: Not on file  Other Topics Concern   Not on file  Social History Narrative   Right handed    Social Determinants of Health   Financial Resource Strain: Not on file  Food Insecurity: Not on file  Transportation Needs: Not on file  Physical Activity: Not on file  Stress: Not on file  Social Connections: Not on file  Intimate Partner Violence: Not on file   Family History  Problem Relation Age of Onset   CAD Father    Colon cancer Father    Diabetes Brother     ROS: Otherwise negative unless mentioned in HPI  Physical Examination  Vitals:   04/15/22 1645 04/15/22 1710  BP:  (!) 132/56  Pulse: 67 68  Resp: 19   Temp:  98.7 F (37.1 C)  SpO2: 97%    Body mass index is 21.97 kg/m.  General:  WDWN in NAD Gait: Not observed HENT: WNL, normocephalic Pulmonary: normal non-labored breathing, without Rales, rhonchi,  wheezing Cardiac: regular Abdomen: soft, NT/ND, no masses Skin: without rashes Vascular Exam/Pulses: 2+ left radial, thrill in fistula Extremities: without ischemic changes, without Gangrene , without cellulitis; without open wounds;  Musculoskeletal: no muscle wasting or atrophy  Neurologic: A&O X 3;  No focal weakness or paresthesias are detected; speech is fluent/normal Psychiatric:   The pt has Normal affect. Lymph:  Unremarkable  CBC    Component Value Date/Time   WBC 5.5 04/15/2022 1328   RBC 3.48 (L) 04/15/2022 1328   HGB 10.7 (L) 04/15/2022 1328   HCT 31.7 (L) 04/15/2022 1328   PLT 116 (L) 04/15/2022 1328   MCV 91.1 04/15/2022 1328   MCH 30.7 04/15/2022 1328   MCHC 33.8 04/15/2022 1328   RDW 14.0 04/15/2022 1328   LYMPHSABS 1.0 04/15/2022 1328   MONOABS 0.6 04/15/2022 1328   EOSABS 0.2 04/15/2022 1328   BASOSABS 0.0 04/15/2022 1328    BMET    Component Value Date/Time   NA 135 04/15/2022 1328   K 4.1 04/15/2022 1328   CL 97 (L) 04/15/2022 1328   CO2 29 04/15/2022 1328   GLUCOSE 167 (H) 04/15/2022 1328   BUN 17 04/15/2022 1328   CREATININE 5.02 (H) 04/15/2022 1328   CALCIUM 8.4 (L) 04/15/2022 1328   GFRNONAA 12 (L) 04/15/2022 1328   GFRAA 8 (L) 12/12/2019 1010    COAGS: Lab Results  Component Value Date   INR 1.0 04/15/2022   INR 1.3 (H) 12/15/2020   INR 1.1 09/07/2019    ASSESSMENT/PLAN: This is a 66 y.o. male presenting with orthostatic hypotension, oozing from left-sided fistula, poorly functioning right IJ TDC.  On exam, the fistula revision site was not bleeding.  The area was evaluated again by one of our PAs later in the day and was also found to be dry.  No plan for further revision at this time.  TDC, will exchange tomorrow. Please make n.p.o. midnight Please call if any questions or concerns arise.   Cassandria Santee MD MS [facility] [phone] 04/15/2022  6:18 PM

## 2022-04-16 ENCOUNTER — Observation Stay (HOSPITAL_BASED_OUTPATIENT_CLINIC_OR_DEPARTMENT_OTHER): Payer: Medicare Other

## 2022-04-16 ENCOUNTER — Observation Stay (HOSPITAL_COMMUNITY): Payer: Medicare Other

## 2022-04-16 ENCOUNTER — Other Ambulatory Visit: Payer: Self-pay

## 2022-04-16 ENCOUNTER — Other Ambulatory Visit (HOSPITAL_BASED_OUTPATIENT_CLINIC_OR_DEPARTMENT_OTHER): Payer: Self-pay

## 2022-04-16 ENCOUNTER — Encounter (HOSPITAL_COMMUNITY): Payer: Self-pay | Admitting: Family Medicine

## 2022-04-16 ENCOUNTER — Observation Stay (HOSPITAL_BASED_OUTPATIENT_CLINIC_OR_DEPARTMENT_OTHER): Payer: Medicare Other | Admitting: Anesthesiology

## 2022-04-16 ENCOUNTER — Observation Stay (HOSPITAL_COMMUNITY): Payer: Medicare Other | Admitting: Anesthesiology

## 2022-04-16 ENCOUNTER — Encounter (HOSPITAL_COMMUNITY)
Admission: EM | Disposition: A | Discharge status: Home / Self Care | Payer: Self-pay | Source: Home / Self Care | Attending: Emergency Medicine

## 2022-04-16 DIAGNOSIS — T82838A Hemorrhage of vascular prosthetic devices, implants and grafts, initial encounter: Secondary | ICD-10-CM | POA: Diagnosis not present

## 2022-04-16 DIAGNOSIS — I25118 Atherosclerotic heart disease of native coronary artery with other forms of angina pectoris: Secondary | ICD-10-CM | POA: Diagnosis not present

## 2022-04-16 DIAGNOSIS — G40109 Localization-related (focal) (partial) symptomatic epilepsy and epileptic syndromes with simple partial seizures, not intractable, without status epilepticus: Secondary | ICD-10-CM

## 2022-04-16 DIAGNOSIS — T829XXA Unspecified complication of cardiac and vascular prosthetic device, implant and graft, initial encounter: Secondary | ICD-10-CM | POA: Diagnosis not present

## 2022-04-16 DIAGNOSIS — I509 Heart failure, unspecified: Secondary | ICD-10-CM

## 2022-04-16 DIAGNOSIS — I2510 Atherosclerotic heart disease of native coronary artery without angina pectoris: Secondary | ICD-10-CM

## 2022-04-16 DIAGNOSIS — I132 Hypertensive heart and chronic kidney disease with heart failure and with stage 5 chronic kidney disease, or end stage renal disease: Secondary | ICD-10-CM

## 2022-04-16 DIAGNOSIS — D649 Anemia, unspecified: Secondary | ICD-10-CM | POA: Diagnosis not present

## 2022-04-16 DIAGNOSIS — I480 Paroxysmal atrial fibrillation: Secondary | ICD-10-CM | POA: Diagnosis not present

## 2022-04-16 DIAGNOSIS — N186 End stage renal disease: Secondary | ICD-10-CM

## 2022-04-16 DIAGNOSIS — T82598A Other mechanical complication of other cardiac and vascular devices and implants, initial encounter: Secondary | ICD-10-CM | POA: Diagnosis not present

## 2022-04-16 DIAGNOSIS — Z992 Dependence on renal dialysis: Secondary | ICD-10-CM

## 2022-04-16 DIAGNOSIS — N185 Chronic kidney disease, stage 5: Secondary | ICD-10-CM | POA: Diagnosis not present

## 2022-04-16 DIAGNOSIS — I951 Orthostatic hypotension: Secondary | ICD-10-CM

## 2022-04-16 DIAGNOSIS — T82898A Other specified complication of vascular prosthetic devices, implants and grafts, initial encounter: Secondary | ICD-10-CM | POA: Diagnosis not present

## 2022-04-16 DIAGNOSIS — I5032 Chronic diastolic (congestive) heart failure: Secondary | ICD-10-CM

## 2022-04-16 HISTORY — PX: REVISON OF ARTERIOVENOUS FISTULA: SHX6074

## 2022-04-16 HISTORY — PX: EXCHANGE OF A DIALYSIS CATHETER: SHX5818

## 2022-04-16 LAB — CBC
HCT: 32.9 % — ABNORMAL LOW (ref 39.0–52.0)
Hemoglobin: 11.1 g/dL — ABNORMAL LOW (ref 13.0–17.0)
MCH: 30.7 pg (ref 26.0–34.0)
MCHC: 33.7 g/dL (ref 30.0–36.0)
MCV: 90.9 fL (ref 80.0–100.0)
Platelets: 121 10*3/uL — ABNORMAL LOW (ref 150–400)
RBC: 3.62 MIL/uL — ABNORMAL LOW (ref 4.22–5.81)
RDW: 13.9 % (ref 11.5–15.5)
WBC: 4.5 10*3/uL (ref 4.0–10.5)
nRBC: 0 % (ref 0.0–0.2)

## 2022-04-16 LAB — BASIC METABOLIC PANEL
Anion gap: 11 (ref 5–15)
BUN: 31 mg/dL — ABNORMAL HIGH (ref 8–23)
CO2: 28 mmol/L (ref 22–32)
Calcium: 8.8 mg/dL — ABNORMAL LOW (ref 8.9–10.3)
Chloride: 96 mmol/L — ABNORMAL LOW (ref 98–111)
Creatinine, Ser: 6.41 mg/dL — ABNORMAL HIGH (ref 0.61–1.24)
GFR, Estimated: 9 mL/min — ABNORMAL LOW (ref >60)
Glucose, Bld: 130 mg/dL — ABNORMAL HIGH (ref 70–99)
Potassium: 5 mmol/L (ref 3.5–5.1)
Sodium: 135 mmol/L (ref 135–145)

## 2022-04-16 LAB — GLUCOSE, CAPILLARY
Glucose-Capillary: 232 mg/dL — ABNORMAL HIGH (ref 70–99)
Glucose-Capillary: 159 mg/dL — ABNORMAL HIGH (ref 70–99)
Glucose-Capillary: 88 mg/dL (ref 70–99)
Glucose-Capillary: 164 mg/dL — ABNORMAL HIGH (ref 70–99)
Glucose-Capillary: 313 mg/dL — ABNORMAL HIGH (ref 70–99)

## 2022-04-16 SURGERY — EXCHANGE OF A DIALYSIS CATHETER
Anesthesia: General | Site: Neck | Laterality: Right

## 2022-04-16 MED ORDER — SODIUM CHLORIDE 0.9 % IV SOLN
20.0000 ug | Freq: Once | INTRAVENOUS | Status: AC
  Administered 2022-04-16: 20 ug via INTRAVENOUS
  Filled 2022-04-16: qty 5

## 2022-04-16 MED ORDER — FENTANYL CITRATE (PF) 100 MCG/2ML IJ SOLN
INTRAMUSCULAR | Status: AC
  Filled 2022-04-16: qty 2

## 2022-04-16 MED ORDER — PROPOFOL 10 MG/ML IV BOLUS
INTRAVENOUS | Status: DC | PRN
  Administered 2022-04-16: 120 mg via INTRAVENOUS

## 2022-04-16 MED ORDER — HEPARIN 6000 UNIT IRRIGATION SOLUTION
Status: DC | PRN
  Administered 2022-04-16: 1

## 2022-04-16 MED ORDER — FENTANYL CITRATE (PF) 250 MCG/5ML IJ SOLN
INTRAMUSCULAR | Status: AC
  Filled 2022-04-16: qty 5

## 2022-04-16 MED ORDER — ACETAMINOPHEN 160 MG/5ML PO SOLN: 325.0000 mg | Freq: Once | ORAL | Status: DC | PRN

## 2022-04-16 MED ORDER — LIDOCAINE 2% (20 MG/ML) 5 ML SYRINGE
INTRAMUSCULAR | Status: DC | PRN
  Administered 2022-04-16: 60 mg via INTRAVENOUS

## 2022-04-16 MED ORDER — HYDROMORPHONE HCL 2 MG PO TABS
2.0000 mg | ORAL_TABLET | ORAL | Status: DC | PRN
  Administered 2022-04-16: 2 mg via ORAL
  Filled 2022-04-16: qty 1

## 2022-04-16 MED ORDER — ISOSORBIDE MONONITRATE ER 60 MG PO TB24
90.0000 mg | ORAL_TABLET | ORAL | Status: DC
Start: 2022-04-16 — End: 2022-04-18
  Administered 2022-04-16: 90 mg via ORAL
  Filled 2022-04-16: qty 1

## 2022-04-16 MED ORDER — ORAL CARE MOUTH RINSE: 15.0000 mL | Freq: Once | OROMUCOSAL | Status: AC

## 2022-04-16 MED ORDER — HEPARIN SODIUM (PORCINE) 1000 UNIT/ML IJ SOLN
INTRAMUSCULAR | Status: DC | PRN
  Administered 2022-04-16: 3000 [IU] via INTRA_ARTERIAL

## 2022-04-16 MED ORDER — HEPARIN 6000 UNIT IRRIGATION SOLUTION
Status: AC
  Filled 2022-04-16: qty 500

## 2022-04-16 MED ORDER — CHLORHEXIDINE GLUCONATE 0.12 % MT SOLN: 15.0000 mL | Freq: Once | OROMUCOSAL | Status: AC

## 2022-04-16 MED ORDER — LIDOCAINE-EPINEPHRINE 1 %-1:200000 IJ SOLN
INTRAMUSCULAR | Status: AC
Start: 2022-04-16 — End: ?
  Filled 2022-04-16: qty 30

## 2022-04-16 MED ORDER — CEFAZOLIN SODIUM-DEXTROSE 2-4 GM/100ML-% IV SOLN
2.0000 g | Freq: Once | INTRAVENOUS | Status: AC
  Administered 2022-04-16: 2 g via INTRAVENOUS
  Filled 2022-04-16: qty 100

## 2022-04-16 MED ORDER — PROMETHAZINE HCL 25 MG/ML IJ SOLN: 6.2500 mg | INTRAMUSCULAR | Status: DC | PRN

## 2022-04-16 MED ORDER — PHENYLEPHRINE 80 MCG/ML (10ML) SYRINGE FOR IV PUSH (FOR BLOOD PRESSURE SUPPORT)
PREFILLED_SYRINGE | INTRAVENOUS | Status: DC | PRN
  Administered 2022-04-16: 40 ug via INTRAVENOUS
  Administered 2022-04-16 (×2): 80 ug via INTRAVENOUS

## 2022-04-16 MED ORDER — ACETAMINOPHEN 10 MG/ML IV SOLN: 1000.0000 mg | Freq: Once | INTRAVENOUS | Status: DC | PRN

## 2022-04-16 MED ORDER — FENTANYL CITRATE (PF) 250 MCG/5ML IJ SOLN
INTRAMUSCULAR | Status: DC | PRN
  Administered 2022-04-16 (×2): 25 ug via INTRAVENOUS

## 2022-04-16 MED ORDER — HYDROMORPHONE HCL 1 MG/ML IJ SOLN
0.5000 mg | INTRAMUSCULAR | Status: DC | PRN
  Administered 2022-04-16: 0.5 mg via INTRAVENOUS
  Filled 2022-04-16: qty 0.5

## 2022-04-16 MED ORDER — HEPARIN SODIUM (PORCINE) 1000 UNIT/ML IJ SOLN
INTRAMUSCULAR | Status: AC
  Filled 2022-04-16: qty 10

## 2022-04-16 MED ORDER — CHLORHEXIDINE GLUCONATE 0.12 % MT SOLN
OROMUCOSAL | Status: AC
  Administered 2022-04-16: 15 mL via OROMUCOSAL
  Filled 2022-04-16: qty 15

## 2022-04-16 MED ORDER — 0.9 % SODIUM CHLORIDE (POUR BTL) OPTIME
TOPICAL | Status: DC | PRN
  Administered 2022-04-16: 1000 mL

## 2022-04-16 MED ORDER — MIDAZOLAM HCL 2 MG/2ML IJ SOLN
INTRAMUSCULAR | Status: AC
  Filled 2022-04-16: qty 2

## 2022-04-16 MED ORDER — SODIUM CHLORIDE 0.9 % IV SOLN: INTRAVENOUS | Status: DC

## 2022-04-16 MED ORDER — ACETAMINOPHEN 325 MG PO TABS: 325.0000 mg | ORAL_TABLET | Freq: Once | ORAL | Status: DC | PRN

## 2022-04-16 MED ORDER — FENTANYL CITRATE (PF) 100 MCG/2ML IJ SOLN
25.0000 ug | INTRAMUSCULAR | Status: DC | PRN
  Administered 2022-04-16: 25 ug via INTRAVENOUS
  Administered 2022-04-16: 50 ug via INTRAVENOUS
  Administered 2022-04-16: 25 ug via INTRAVENOUS

## 2022-04-16 MED ORDER — EPHEDRINE SULFATE-NACL 50-0.9 MG/10ML-% IV SOSY
PREFILLED_SYRINGE | INTRAVENOUS | Status: DC | PRN
  Administered 2022-04-16: 5 mg via INTRAVENOUS

## 2022-04-16 MED ORDER — FENTANYL CITRATE (PF) 100 MCG/2ML IJ SOLN: 25.0000 ug | INTRAMUSCULAR | Status: DC | PRN

## 2022-04-16 MED ORDER — LACTATED RINGERS IV SOLN: INTRAVENOUS | Status: DC

## 2022-04-16 MED ORDER — ONDANSETRON HCL 4 MG/2ML IJ SOLN
INTRAMUSCULAR | Status: DC | PRN
  Administered 2022-04-16: 4 mg via INTRAVENOUS

## 2022-04-16 SURGICAL SUPPLY — 71 items
ARMBAND PINK RESTRICT EXTREMIT (MISCELLANEOUS) ×3 IMPLANT
BAG COUNTER SPONGE SURGICOUNT (BAG) ×3 IMPLANT
BAG DECANTER FOR FLEXI CONT (MISCELLANEOUS) ×3 IMPLANT
BIOPATCH RED 1 DISK 7.0 (GAUZE/BANDAGES/DRESSINGS) ×3 IMPLANT
CANISTER SUCT 3000ML PPV (MISCELLANEOUS) ×3 IMPLANT
CANISTER WOUNDNEG PRESSURE 500 (CANNISTER) ×1 IMPLANT
CATH BEACON 5 .035 40 KMP TP (CATHETERS) IMPLANT
CATH BEACON 5 .038 40 KMP TP (CATHETERS) ×3
CATH PALINDROME-P 19CM W/VT (CATHETERS) IMPLANT
CATH PALINDROME-P 23CM W/VT (CATHETERS) IMPLANT
CATH PALINDROME-P 28CM W/VT (CATHETERS) ×1 IMPLANT
CLIP LIGATING EXTRA MED SLVR (CLIP) ×3 IMPLANT
CLIP LIGATING EXTRA SM BLUE (MISCELLANEOUS) ×3 IMPLANT
CLIP VESOCCLUDE MED 6/CT (CLIP) ×3 IMPLANT
COVER PROBE W GEL 5X96 (DRAPES) ×3 IMPLANT
COVER SURGICAL LIGHT HANDLE (MISCELLANEOUS) ×3 IMPLANT
DERMABOND ADVANCED (GAUZE/BANDAGES/DRESSINGS) ×1
DERMABOND ADVANCED .7 DNX12 (GAUZE/BANDAGES/DRESSINGS) ×2 IMPLANT
DEVICE TORQUE H2O (MISCELLANEOUS) ×1 IMPLANT
DRAPE C-ARM 42X72 X-RAY (DRAPES) ×3 IMPLANT
DRAPE CHEST BREAST 15X10 FENES (DRAPES) ×3 IMPLANT
DRESSING PEEL AND PLC PRVNA 13 (GAUZE/BANDAGES/DRESSINGS) IMPLANT
DRSG PEEL AND PLACE PREVENA 13 (GAUZE/BANDAGES/DRESSINGS) ×3
ELECT REM PT RETURN 9FT ADLT (ELECTROSURGICAL) ×3
ELECTRODE REM PT RTRN 9FT ADLT (ELECTROSURGICAL) ×2 IMPLANT
GAUZE 4X4 16PLY ~~LOC~~+RFID DBL (SPONGE) ×3 IMPLANT
GLOVE BIO SURGEON STRL SZ7.5 (GLOVE) ×3 IMPLANT
GLOVE BIOGEL PI IND STRL 8 (GLOVE) ×2 IMPLANT
GLOVE BIOGEL PI INDICATOR 8 (GLOVE) ×1
GLOVE SRG 8 PF TXTR STRL LF DI (GLOVE) ×2 IMPLANT
GLOVE SURG POLYISO LF SZ8 (GLOVE) IMPLANT
GLOVE SURG UNDER POLY LF SZ8 (GLOVE) ×3
GOWN STRL REUS W/ TWL LRG LVL3 (GOWN DISPOSABLE) ×4 IMPLANT
GOWN STRL REUS W/TWL 2XL LVL3 (GOWN DISPOSABLE) ×6 IMPLANT
GOWN STRL REUS W/TWL LRG LVL3 (GOWN DISPOSABLE) ×6
GUIDEWIRE ANGLED .035X150CM (WIRE) ×2 IMPLANT
KIT BASIN OR (CUSTOM PROCEDURE TRAY) ×3 IMPLANT
KIT DRSG PREVENA PLUS 7DAY 125 (MISCELLANEOUS) ×1 IMPLANT
KIT PALINDROME-P 55CM (CATHETERS) IMPLANT
KIT TURNOVER KIT B (KITS) ×3 IMPLANT
NDL 18GX1X1/2 (RX/OR ONLY) (NEEDLE) ×2 IMPLANT
NDL HYPO 25GX1X1/2 BEV (NEEDLE) ×2 IMPLANT
NEEDLE 18GX1X1/2 (RX/OR ONLY) (NEEDLE) ×3 IMPLANT
NEEDLE HYPO 25GX1X1/2 BEV (NEEDLE) ×3 IMPLANT
NS IRRIG 1000ML POUR BTL (IV SOLUTION) ×3 IMPLANT
PACK CV ACCESS (CUSTOM PROCEDURE TRAY) ×3 IMPLANT
PACK SURGICAL SETUP 50X90 (CUSTOM PROCEDURE TRAY) ×3 IMPLANT
PAD ARMBOARD 7.5X6 YLW CONV (MISCELLANEOUS) ×6 IMPLANT
SLING ARM FOAM STRAP LRG (SOFTGOODS) IMPLANT
SLING ARM FOAM STRAP MED (SOFTGOODS) IMPLANT
SOAP 2 % CHG 4 OZ (WOUND CARE) ×3 IMPLANT
SPIKE FLUID TRANSFER (MISCELLANEOUS) ×3 IMPLANT
STAPLER VISISTAT 35W (STAPLE) ×1 IMPLANT
SUT ETHILON 3 0 PS 1 (SUTURE) ×3 IMPLANT
SUT MNCRL AB 4-0 PS2 18 (SUTURE) ×3 IMPLANT
SUT PROLENE 5 0 C 1 24 (SUTURE) IMPLANT
SUT PROLENE 6 0 BV (SUTURE) IMPLANT
SUT PROLENE 7 0 BV 1 (SUTURE) IMPLANT
SUT SILK 3 0 SH CR/8 (SUTURE) ×3 IMPLANT
SUT VIC AB 3-0 SH 27 (SUTURE) ×3
SUT VIC AB 3-0 SH 27X BRD (SUTURE) ×2 IMPLANT
SYR 10ML LL (SYRINGE) ×3 IMPLANT
SYR 20ML LL LF (SYRINGE) ×6 IMPLANT
SYR 30ML LL (SYRINGE) IMPLANT
SYR 5ML LL (SYRINGE) ×3 IMPLANT
SYR CONTROL 10ML LL (SYRINGE) ×3 IMPLANT
TAPE VIPERTRACK RADIOPAQ 30X (MISCELLANEOUS) IMPLANT
TAPE VIPERTRACK RADIOPAQUE (MISCELLANEOUS) ×3
TOWEL GREEN STERILE (TOWEL DISPOSABLE) ×3 IMPLANT
UNDERPAD 30X36 HEAVY ABSORB (UNDERPADS AND DIAPERS) ×3 IMPLANT
WATER STERILE IRR 1000ML POUR (IV SOLUTION) ×3 IMPLANT

## 2022-04-16 NOTE — Assessment & Plan Note (Addendum)
Imdur and hydralazine held on admission secondary to dizziness and concern for possible orthostatic hypotension. Blood pressure elevated and uncontrolled. Resume home regimen.

## 2022-04-16 NOTE — Anesthesia Preprocedure Evaluation (Signed)
Anesthesia Evaluation  Patient identified by MRN, date of birth, ID band Patient awake    Reviewed: Allergy & Precautions, NPO status , Patient's Chart, lab work & pertinent test results  Airway Mallampati: III  TM Distance: >3 FB Neck ROM: Full    Dental  (+) Teeth Intact, Dental Advisory Given   Pulmonary COPD, former smoker,    breath sounds clear to auscultation       Cardiovascular hypertension, Pt. on home beta blockers + CAD, + CABG, + Peripheral Vascular Disease and +CHF  + dysrhythmias Atrial Fibrillation  Rhythm:Regular Rate:Normal     Neuro/Psych Seizures -, Well Controlled,  PSYCHIATRIC DISORDERS Depression  Neuromuscular disease    GI/Hepatic Neg liver ROS, GERD  Medicated,  Endo/Other  diabetes, Type 1, Insulin Dependent  Renal/GU ESRF and DialysisRenal disease     Musculoskeletal negative musculoskeletal ROS (+)   Abdominal Normal abdominal exam  (+)   Peds  Hematology   Anesthesia Other Findings   Reproductive/Obstetrics                             Anesthesia Physical Anesthesia Plan  ASA: 3  Anesthesia Plan: General   Post-op Pain Management:    Induction: Intravenous  PONV Risk Score and Plan: 3 and Ondansetron, Dexamethasone and Midazolam  Airway Management Planned: LMA  Additional Equipment: None  Intra-op Plan:   Post-operative Plan: Extubation in OR  Informed Consent: I have reviewed the patients History and Physical, chart, labs and discussed the procedure including the risks, benefits and alternatives for the proposed anesthesia with the patient or authorized representative who has indicated his/her understanding and acceptance.     Dental advisory given  Plan Discussed with: CRNA  Anesthesia Plan Comments:         Anesthesia Quick Evaluation

## 2022-04-16 NOTE — Op Note (Signed)
DATE OF SERVICE: 04/16/2022  PATIENT:  Kenneth [name]  66 y.o. male  PRE-OPERATIVE DIAGNOSIS:  ESRD  POST-OPERATIVE DIAGNOSIS:  Same  PROCEDURE:   1) exchange of right internal jugular tunneled dialysis catheter (28cm) 2) exploration of left arm arteriovenous fistula revision  SURGEON:  Surgeon(s) and Role:    * Cherre Robins, MD - Primary  ASSISTANT: none  ANESTHESIA:   general  EBL: minimal  BLOOD ADMINISTERED:none  DRAINS: none   LOCAL MEDICATIONS USED:  NONE  SPECIMEN:  none  COUNTS: confirmed correct.  TOURNIQUET:  none  PATIENT DISPOSITION:  PACU - hemodynamically stable.   Delay start of Pharmacological VTE agent (>24hrs) due to surgical blood loss or risk of bleeding: no  INDICATION FOR PROCEDURE: Kenneth [name] is a 66 y.o. male with ESRD.  I recently revised his left arm AV fistula.  He has had persistent oozing from this incision since which is caused him and his family great concern.  His hemoglobin is fairly stable.  His right IJ tunneled dialysis catheter is not working well.  His nephrology team is asking for revision of this.    OPERATIVE FINDINGS: I exchanged the right IJ tunneled dialysis catheter for a 28 cm catheter and tunneled this through a new track to avoid kinking.  I reopen the left arm and found no active bleeding.  There was a serosanguineous fluid collection which was evacuated.  I closed the arm loosely to allow drainage.  Pravena was applied  DESCRIPTION OF PROCEDURE: After identification of the patient in the pre-operative holding area, the patient was transferred to the operating room. The patient was positioned supine on the operating room table. Anesthesia was induced. The right neck and left arm was prepped and draped in standard fashion. A surgical pause was performed confirming correct patient, procedure, and operative location.  Two Glidewires were advanced through the existing catheter into the inferior vena cava.  The catheter was  removed over the wires.  The wire was removed through the tunnel site.  Over the wire a dilator was introduced into the inferior vena cava.  The wires were removed and discarded.  A 28 cm catheter was prepared and tunneled through a new site lateral to her existing site to emerge behind the sheath.  The catheter was then threaded into the right atrium through the peel-away sheath. The catheter sat nicely. The catheter flushed and drawed easily with heparinized saline. The catheter was sutured to the skin. The neck incision was closed with monocryl.   I reopened the left arm incision. No active bleeding was noted. The wound was carefully inspected. The fistula revision was provoked aggressively. No bleeding was noted. The wound was closed using 3-O vicryl and a stapler. A prevena vac was applied.  Upon completion of the case instrument and sharps counts were confirmed correct. The patient was transferred to the PACU in good condition. I was present for all portions of the procedure.  Yevonne Aline. Stanford Breed, MD [facility] Phone Number: [phone] 04/16/2022 12:54 PM

## 2022-04-16 NOTE — Anesthesia Procedure Notes (Signed)
Procedure Name: LMA Insertion Date/Time: 04/16/2022 11:05 AM  Performed by: Griffin Dakin, CRNAPre-anesthesia Checklist: Patient identified, Emergency Drugs available, Suction available and Patient being monitored Patient Re-evaluated:Patient Re-evaluated prior to induction Oxygen Delivery Method: Circle system utilized Preoxygenation: Pre-oxygenation with 100% oxygen Induction Type: IV induction Ventilation: Mask ventilation without difficulty LMA: LMA inserted LMA Size: 5.0 Number of attempts: 1 Placement Confirmation: positive ETCO2 and breath sounds checked- equal and bilateral Tube secured with: Tape Dental Injury: Teeth and Oropharynx as per pre-operative assessment

## 2022-04-16 NOTE — Anesthesia Postprocedure Evaluation (Signed)
Anesthesia Post Note  Patient: THADDIUS MANES  Procedure(s) Performed: EXCHANGE OF A DIALYSIS CATHETER (Right: Neck) REVISON OF ARTERIOVENOUS FISTULA (Left: Arm Upper)     Patient location during evaluation: PACU Anesthesia Type: General Level of consciousness: awake and alert Pain management: pain level controlled Vital Signs Assessment: post-procedure vital signs reviewed and stable Respiratory status: spontaneous breathing, nonlabored ventilation, respiratory function stable and patient connected to nasal cannula oxygen Cardiovascular status: blood pressure returned to baseline and stable Postop Assessment: no apparent nausea or vomiting Anesthetic complications: no   No notable events documented.  Last Vitals:  Vitals:   04/16/22 1245 04/16/22 1300  BP: (!) 188/75 (!) 173/75  Pulse: 63 62  Resp: (!) 7 12  Temp:    SpO2: 92% 93%    Last Pain:  Vitals:   04/16/22 1245  TempSrc:   PainSc: Asleep                 Effie Berkshire

## 2022-04-16 NOTE — Progress Notes (Signed)
PROGRESS NOTE    Kenneth [name]  CHE:[id number] DOB: 04/22/1956 DOA: 04/15/2022 PCP: Shelda Pal, DO   Brief Narrative: No notes on file   Assessment and Plan: * Complication associated with dialysis catheter Vascular surgery consulted for [facility] exchange which was successfully completed on 8/3  Complication of AV dialysis fistula Oozing from left arm fistula. Hemoglobin drifted down slightly. AV fistula explored in the OR by vascular surgery. Eliquis held. -Continued wound care/dressing changes for oozing -CBC in AM  Orthostatic hypotension Possibly related to worsened anemia vs antihypertensives while on hemodialysis. Symptoms appear improved today.  Acute on chronic anemia hgb 13.3>10.7 over 2 weeks and symptomatic due to bleeding fistula  hgb has been around 7-8 prior throughout June though EPO per renal  Follow cbc  Transfuse if hgb <8 with extensive cardiac history   Atherosclerotic heart disease of native coronary artery with other forms of angina pectoris with elevated troponin  History of CABG and stent placement. No current chest pain. Transfusion goal of >8. -Continue Lipitor and Coreg  ESRD (end stage renal disease) on dialysis [facility]) Dialysis on MWF. At dialysis today, but unsure if he had full session since [facility] not functioning properly No need for urgent dialysis Nephrology consulted for possible dialysis tomorrow if needed after [facility] exchange/repair   AF (paroxysmal atrial fibrillation) [facility]) Patient is on Coreg and Eliquis. Currently normal sinus rhythm with normal rate. Eliquis held secondary to oozing and procedure. -Continue Coreg  Chronic diastolic CHF (congestive heart failure) (HCC) Euvolemic. Stable. Fluid management per nephrology with HD.  Type 2 diabetes mellitus with chronic kidney disease on chronic dialysis, with long-term current use of insulin ([facility]) Most recent hemoglobin A1C of 9% from May 2023. Patient manages on an insulin pump as  an outpatient, which is held this admission. Patient started on SSI. -Continue SSI  -If patient decides not to use insulin pump, will consider adding basal insulin.  Partial seizure disorder (HCC) -Continue Keppra  Gastroesophageal reflux disease -Continue Protonix  Hypertensive heart disease Imdur and hydralazine held on admission secondary to dizziness and concern for possible orthostatic hypotension. Blood pressure elevated and uncontrolled -Resume home Imdur (hold on Mon, Wed, Fri for HD)     DVT prophylaxis: SCDs Code Status:   Code Status: Full Code Family Communication: Wife and "family member" at bedside Disposition Plan: Discharge home tomorrow pending stability of oozing from fistula site   Consultants:  Vascular surgery Nephrology  Procedures:  Exchange of right internal jugular tunneled dialysis catheter (28cm) Exploration of left arm arteriovenous fistula revision  Antimicrobials: None    Subjective:  [name]: Faith #[id number]  Objective: BP (!) 160/68   Pulse 62   Temp 98.3 F (36.8 C) (Oral)   Resp 18   Ht 5\' 5"  (1.651 m)   Wt 59.9 kg   SpO2 98%   BMI 21.97 kg/m   Examination:  General exam: Appears calm and comfortable Respiratory system: Clear to auscultation. Respiratory effort normal. Cardiovascular system: S1 & S2 heard, RRR. 1/6 systolic murmur. Gastrointestinal system: Abdomen is nondistended, soft and nontender. No organomegaly or masses felt. Normal bowel sounds heard. Central nervous system: Alert and oriented. No focal neurological deficits. Musculoskeletal: No edema. No calf tenderness. Left forearm fistula with intact gauze dressing Skin: No cyanosis. No rashes Psychiatry: Judgement and insight appear normal. Mood & affect appropriate.    Data Reviewed: I have personally reviewed following labs and imaging studies  CBC Lab Results  Component Value Date  WBC 4.5 04/16/2022   RBC 3.62 (L) 04/16/2022   HGB 11.1 (L)  04/16/2022   HCT 32.9 (L) 04/16/2022   MCV 90.9 04/16/2022   MCH 30.7 04/16/2022   PLT 121 (L) 04/16/2022   MCHC 33.7 04/16/2022   RDW 13.9 04/16/2022   LYMPHSABS 1.0 04/15/2022   MONOABS 0.6 04/15/2022   EOSABS 0.2 04/15/2022   BASOSABS 0.0 32/67/1245     Last metabolic panel Lab Results  Component Value Date   NA 135 04/16/2022   K 5.0 04/16/2022   CL 96 (L) 04/16/2022   CO2 28 04/16/2022   BUN 31 (H) 04/16/2022   CREATININE 6.41 (H) 04/16/2022   GLUCOSE 130 (H) 04/16/2022   GFRNONAA 9 (L) 04/16/2022   GFRAA 8 (L) 12/12/2019   CALCIUM 8.8 (L) 04/16/2022   PHOS 2.9 02/25/2022   PROT 7.2 04/15/2022   ALBUMIN 3.3 (L) 04/15/2022   BILITOT 0.9 04/15/2022   ALKPHOS 55 04/15/2022   AST 29 04/15/2022   ALT 7 04/15/2022   ANIONGAP 11 04/16/2022    GFR: Estimated Creatinine Clearance: 9.9 mL/min (A) (by C-G formula based on SCr of 6.41 mg/dL (H)).  Recent Results (from the past 240 hour(s))  Resp Panel by RT-PCR (Flu A&B, Covid) Anterior Nasal Swab     Status: None   Collection Time: 04/15/22 12:58 PM   Specimen: Anterior Nasal Swab  Result Value Ref Range Status   SARS Coronavirus 2 by RT PCR NEGATIVE NEGATIVE Final    Comment: (NOTE) SARS-CoV-2 target nucleic acids are NOT DETECTED.  The SARS-CoV-2 RNA is generally detectable in upper respiratory specimens during the acute phase of infection. The lowest concentration of SARS-CoV-2 viral copies this assay can detect is 138 copies/mL. A negative result does not preclude SARS-Cov-2 infection and should not be used as the sole basis for treatment or other patient management decisions. A negative result may occur with  improper specimen collection/handling, submission of specimen other than nasopharyngeal swab, presence of viral mutation(s) within the areas targeted by this assay, and inadequate number of viral copies(<138 copies/mL). A negative result must be combined with clinical observations, patient history, and  epidemiological information. The expected result is Negative.  Fact Sheet for Patients:  EntrepreneurPulse.com.au  Fact Sheet for Healthcare Providers:  IncredibleEmployment.be  This test is no t yet approved or cleared by the [place] FDA and  has been authorized for detection and/or diagnosis of SARS-CoV-2 by FDA under an Emergency Use Authorization (EUA). This EUA will remain  in effect (meaning this test can be used) for the duration of the COVID-19 declaration under Section 564(b)(1) of the Act, 21 U.S.C.section 360bbb-3(b)(1), unless the authorization is terminated  or revoked sooner.       Influenza A by PCR NEGATIVE NEGATIVE Final   Influenza B by PCR NEGATIVE NEGATIVE Final    Comment: (NOTE) The Xpert Xpress SARS-CoV-2/FLU/RSV plus assay is intended as an aid in the diagnosis of influenza from Nasopharyngeal swab specimens and should not be used as a sole basis for treatment. Nasal washings and aspirates are unacceptable for Xpert Xpress SARS-CoV-2/FLU/RSV testing.  Fact Sheet for Patients: EntrepreneurPulse.com.au  Fact Sheet for Healthcare Providers: IncredibleEmployment.be  This test is not yet approved or cleared by the [place] FDA and has been authorized for detection and/or diagnosis of SARS-CoV-2 by FDA under an Emergency Use Authorization (EUA). This EUA will remain in effect (meaning this test can be used) for the duration of the COVID-19 declaration under Section 564(b)(1) of  the Act, 21 U.S.C. section 360bbb-3(b)(1), unless the authorization is terminated or revoked.  Performed at [facility], [place] [address] [place], [place] [address]       Radiology Studies: HYBRID OR IMAGING ([place])  Result Date: 04/16/2022 There is no interpretation for this exam.  This order is for images obtained during a surgical procedure.  Please See "Surgeries" Tab for more  information regarding the procedure.   DG Chest Port 1 View  Result Date: 04/15/2022 CLINICAL DATA:  Weakness and dizziness. EXAM: PORTABLE CHEST 1 VIEW COMPARISON:  Chest radiograph 04/10/2019. FINDINGS: The right-sided dialysis catheter appears slightly retracted compared to the prior study with the tip in the mid SVC. There is a new mild kink in the catheter at the expected location of the skin surface. The cardiomediastinal silhouette is stable. There is no focal consolidation or pulmonary edema. There is no pleural effusion or pneumothorax. Linear opacities in the left mid lung/lingula are unchanged and may reflect atelectasis or scar. There is no pleural effusion or pneumothorax There is no acute osseous abnormality. IMPRESSION: 1. No radiographic evidence of acute cardiopulmonary process. 2. The right-sided dialysis catheter appears slightly retracted compared to the prior study with the tip in the mid SVC. There is a new mild kink in the catheter at the expected location of the skin surface. Correlate with catheter function. Electronically Signed   By: Valetta Mole M.D.   On: 04/15/2022 13:27      LOS: 0 days    Cordelia Poche, MD [facility] 04/16/2022, 12:06 PM   If 7PM-7AM, please contact night-coverage [email or web address]

## 2022-04-16 NOTE — Progress Notes (Signed)
Pt left early in AM for surgery. All assessments not completed prior to leaving for OR.

## 2022-04-16 NOTE — Transfer of Care (Signed)
Immediate Anesthesia Transfer of Care Note  Patient: CALEM COCOZZA  Procedure(s) Performed: EXCHANGE OF A DIALYSIS CATHETER (Right: Neck) REVISON OF ARTERIOVENOUS FISTULA (Left: Arm Upper)  Patient Location: PACU  Anesthesia Type:General  Level of Consciousness: awake, alert  and oriented  Airway & Oxygen Therapy: Patient Spontanous Breathing  Post-op Assessment: Report given to RN and Post -op Vital signs reviewed and stable  Post vital signs: Reviewed and stable  Last Vitals:  Vitals Value Taken Time  BP 194/72 04/16/22 1217  Temp    Pulse 63 04/16/22 1221  Resp 13 04/16/22 1221  SpO2 98 % 04/16/22 1221  Vitals shown include unvalidated device data.  Last Pain:  Vitals:   04/16/22 1029  TempSrc:   PainSc: 4          Complications: No notable events documented.

## 2022-04-16 NOTE — Discharge Summary (Signed)
Physician Discharge Summary   Patient: Kenneth [name] MRN: [id number] DOB: 1956/02/20  Admit date:     04/15/2022  Discharge date: 04/17/22  Discharge Physician: Cordelia Poche, MD   PCP: Shelda Pal, DO   Recommendations at discharge:  PCP and Vascular surgery follow-up Restart Eliquis in 3 days per Vascular Surgery Wound vac will self-discontinue in about 7 days  Discharge Diagnoses: Principal Problem:   Complication associated with dialysis catheter Active Problems:   Complication of AV dialysis fistula   Orthostatic hypotension   Acute on chronic anemia   Atherosclerotic heart disease of native coronary artery with other forms of angina pectoris with elevated troponin    Elevated troponin   ESRD (end stage renal disease) on dialysis (HCC)   AF (paroxysmal atrial fibrillation) (HCC)   Chronic diastolic CHF (congestive heart failure) ([place])   Type 2 diabetes mellitus with chronic kidney disease on chronic dialysis, with long-term current use of insulin (HCC)   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 *  Hospital Course: CORDALE MANERA is a 66 y.o. is a 66 y.o. male with a history of CAD, CHF, diabetes mellitus type 2, hypertension, hyperlipidemia, ESRD on HD, paroxysmal atrial fibrillation, seizures, depression. Patient presented secondary to non functioning tunneled dialysis catheter and bleeding from AV fistula. Vascular surgery was consulted and performed AV fistula exploration for revision and TDC exchange. Wound vacuum applied to fistula wound. Hemoglobin down to 8.3 on day of discharge without symptoms. Discussed with nephrology who will recheck hemoglobin at next dialysis session.  Assessment and Plan: * Complication associated with dialysis catheter Vascular surgery consulted for [facility] exchange which was successfully completed on  8/3  Complication of AV dialysis fistula Oozing from left arm fistula. Hemoglobin drifted down slightly. AV fistula explored in the OR by vascular surgery for revision. Eliquis held. Wound vacuum applied. Patient to discharge with a wound vacuum.  Orthostatic hypotension Possibly related to worsened anemia vs antihypertensives while on hemodialysis. Symptoms appear improved.  Acute on chronic anemia Baseline hemoglobin of about 8. Hemoglobin has reached up to 13 recently and has drifted back down to 8.3. Patient is asymptomatic and currently without evidence of active bleeding. Will recommend repeat CBC at next hemodialysis prior to restarting Eliquis to ensure continued stability.  Atherosclerotic heart disease of native coronary artery with other forms of angina pectoris with elevated troponin  History of CABG and stent placement. No current chest pain. Transfusion goal of >8. Continue Lipitor and Coreg.  ESRD (end stage renal disease) on dialysis [facility]) Dialysis on MWF. At dialysis today, but unsure if he had full session since [facility] not functioning properly No need for urgent dialysis Nephrology consulted for possible dialysis tomorrow if needed after [facility] exchange/repair   AF (paroxysmal atrial fibrillation) [facility]) Patient is on Coreg and Eliquis. Currently normal sinus rhythm with normal rate. Eliquis held secondary to oozing and procedure. Continue Coreg. Resume Eliquis in 4 days pending recommendation for repeat CBC on 8/7.  Chronic diastolic CHF (congestive heart failure) (HCC) Euvolemic. Stable. Fluid management per nephrology with HD.  Type 2 diabetes mellitus with chronic kidney disease on chronic dialysis, with long-term current use of insulin ([place]) Most recent hemoglobin A1C of 9% from May 2023. Patient manages on an insulin pump as an outpatient, which is held this admission. Patient started on SSI. Continue outpatient management.  Partial  seizure disorder ([place]) Continue  Keppra.  Gastroesophageal reflux disease Continue Protonix.  Hypertensive heart disease Imdur and hydralazine held on admission secondary to dizziness and concern for possible orthostatic hypotension. Blood pressure elevated and uncontrolled. Resume home regimen.    Consultants: Nephrology, Vascular surgery Procedures performed: Exchange of right internal jugular tunneled dialysis catheter (28cm); Exploration of left arm arteriovenous fistula revision  Disposition: Home Diet recommendation: Renal diet   DISCHARGE MEDICATION: Allergies as of 04/17/2022   No Known Allergies      Medication List     TAKE these medications    apixaban 5 MG Tabs tablet Commonly known as: ELIQUIS Take 1 tablet by mouth 2 times daily Start taking on: April 21, 2022 What changed: These instructions start on April 21, 2022. If you are unsure what to do until then, ask your doctor or other care provider.   atorvastatin 80 MG tablet Commonly known as: LIPITOR Take 1 tablet (80 mg total) by mouth daily.   calcium acetate 667 MG capsule Commonly known as: PHOSLO Take 667 mg by mouth 3 (three) times daily with meals.   carvedilol 6.25 MG tablet Commonly known as: COREG Take 1 tablet (6.25 mg total) by mouth 2 (two) times daily with a meal. Hold on the mornings of dialysis.   diclofenac Sodium 1 % Gel Commonly known as: Voltaren Apply 4 grams topically 4 (four) times daily.   ezetimibe 10 MG tablet Commonly known as: ZETIA Take 1 tablet by mouth daily   FreeStyle Libre 2 Sensor Misc 1 Device by Does not apply route every 14 (fourteen) days.   hydrALAZINE 100 MG tablet Commonly known as: APRESOLINE Take 1 tablet (100 mg total) by mouth 3 (three) times daily. DO NOT TAKE AM AND NOON DOSES ON HD DAYS   isosorbide mononitrate 60 MG 24 hr tablet Commonly known as: IMDUR Take 1&1/2 tablets (90 mg total) by mouth daily. Hold on days of dialysis on Mon, Wed, Fri.   levETIRAcetam 500 MG  tablet Commonly known as: KEPPRA Take 1 tablet (500 mg total) by mouth at bedtime.   levETIRAcetam 250 MG tablet Commonly known as: KEPPRA Take 1 tablet (250 mg total) by mouth every Monday, Wednesday, and Friday at 8 PM.   LOKELMA PO Take 1 packet by mouth 4 (four) times a week. Hold on dialysis days   Lyumjev 100 UNIT/ML Soln Generic drug: Insulin Lispro-aabc Max Daily 30 units per insulin pump   megestrol 625 MG/5ML suspension Commonly known as: Megace ES Take 5 mLs (625 mg total) by mouth daily.   MIRCERA IJ Inject as directed.   OneTouch Verio test strip Generic drug: glucose blood 1 each by Other route in the morning, at noon, in the evening, and at bedtime. Use as instructed   pantoprazole 40 MG tablet Commonly known as: PROTONIX Take 1 tablet (40 mg total) by mouth 2 (two) times daily.   sertraline 50 MG tablet Commonly known as: Zoloft Take 1 tablet (50 mg total) by mouth daily.   Stiolto Respimat 2.5-2.5 MCG/ACT Aers Generic drug: Tiotropium Bromide-Olodaterol Inhale 2 puffs by mouth into the lungs daily.   TechLite Pen Needles 32G X 6 MM Misc Generic drug: Insulin Pen Needle Use as directed 3 times daily        Follow-up Information     [facility] Follow up.   Why: 05/05/22.The office will call the patient with an appointment Contact information: [address] [place] [phone]  Discharge Exam: BP (!) 181/70   Pulse 69   Temp 98.8 F (37.1 C) (Oral)   Resp 12   Ht 5\' 5"  (1.651 m)   Wt 59.9 kg   SpO2 98%   BMI 21.97 kg/m    General exam: Appears calm and comfortable  Respiratory system: Clear to auscultation. Respiratory effort normal. Cardiovascular system: S1 & S2 heard, RRR. 1/6 systolic murmur. Gastrointestinal system: Abdomen is nondistended, soft and nontender. Normal bowel sounds heard. Central nervous system: Alert and oriented. No focal neurological  deficits. Musculoskeletal: No edema. No calf tenderness Skin: No cyanosis. No rashes Psychiatry: Judgement and insight appear normal. Mood & affect appropriate.   Condition at discharge: stable  The results of significant diagnostics from this hospitalization (including imaging, microbiology, ancillary and laboratory) are listed below for reference.   Imaging Studies: VAS US DUPLEX DIALYSIS ACCESS (AVF, AVG)  Result Date: 04/16/2022 DIALYSIS ACCESS Patient Name:  GARTH DIFFLEY [facility]  Date of Exam:   04/16/2022 Medical Rec #: [id number]    Accession #:    [id number] Date of Birth: August 02, 1956   Patient Gender: M Patient Age:   36 years Exam Location:  [facility] Procedure:      VAS US DUPLEX DIALYSIS ACCESS (AVF, AVG) Referring Phys: Vonna Kotyk ROBINS --------------------------------------------------------------------------------  Reason for Exam: Bleeding AVF. Access Site: Left Upper Extremity. Access Type: Brachial-cephalic AVF. Comparison Study: No prior study Performing Technologist: Maudry Mayhew MHA, RDMS, RVT, RDCS  Examination Guidelines: A complete evaluation includes B-mode imaging, spectral Doppler, color Doppler, and power Doppler as needed of all accessible portions of each vessel. Unilateral testing is considered an integral part of a complete examination. Limited examinations for reoccurring indications may be performed as noted.  Findings: +--------------------+----------+-----------------+--------+ AVF                 PSV (cm/s)Flow Vol (mL/min)Comments +--------------------+----------+-----------------+--------+ Native artery inflow   203           444                +--------------------+----------+-----------------+--------+ AVF Anastomosis        121                              +--------------------+----------+-----------------+--------+  +------------+----------+-------------+----------+-----------------------------+ OUTFLOW VEINPSV (cm/s)Diameter (cm)Depth (cm)           Describe            +------------+----------+-------------+----------+-----------------------------+ Prox UA         39        0.81        1.79                                 +------------+----------+-------------+----------+-----------------------------+ Mid UA         595                    1.43    0.84cm diameter with 0.4cm                                                residual diameter (stenotic)  +------------+----------+-------------+----------+-----------------------------+ Dist UA         67        1.19  0.20                                 +------------+----------+-------------+----------+-----------------------------+   Summary: Arteriovenous fistula-Stenosis noted. *See table(s) above for measurements and observations.  Diagnosing physician: Jamelle Haring Electronically signed by Jamelle Haring on 04/16/2022 at 4:31:59 PM.   --------------------------------------------------------------------------------   Final    DG Chest 1 View  Result Date: 04/16/2022 CLINICAL DATA:  A 66 year old male presents for evaluation of central venous access. EXAM: CHEST  1 VIEW COMPARISON:  April 15, 2022 FINDINGS: Since the August 2nd exam the catheter has been advanced to the distal superior vena cava area. This catheter enters via RIGHT IJ approach. EKG leads project over the chest. Post median sternotomy for CABG. No lobar consolidation. No pneumothorax or pleural effusion. No sign of pulmonary edema. On limited assessment no acute skeletal process. Signs of LEFT axillary vascular stenting as before. IMPRESSION: Post exchange and or advancement of the RIGHT IJ dialysis catheter, tip in the area of the distal superior vena cava. Electronically Signed   By: Zetta Bills M.D.   On: 04/16/2022 13:02   HYBRID OR IMAGING ([facility] ONLY)  Result Date: 04/16/2022 There is no interpretation for this exam.  This order is for images obtained during a surgical procedure.  Please See "Surgeries"  Tab for more information regarding the procedure.   DG Chest Port 1 View  Result Date: 04/15/2022 CLINICAL DATA:  Weakness and dizziness. EXAM: PORTABLE CHEST 1 VIEW COMPARISON:  Chest radiograph 04/10/2019. FINDINGS: The right-sided dialysis catheter appears slightly retracted compared to the prior study with the tip in the mid SVC. There is a new mild kink in the catheter at the expected location of the skin surface. The cardiomediastinal silhouette is stable. There is no focal consolidation or pulmonary edema. There is no pleural effusion or pneumothorax. Linear opacities in the left mid lung/lingula are unchanged and may reflect atelectasis or scar. There is no pleural effusion or pneumothorax There is no acute osseous abnormality. IMPRESSION: 1. No radiographic evidence of acute cardiopulmonary process. 2. The right-sided dialysis catheter appears slightly retracted compared to the prior study with the tip in the mid SVC. There is a new mild kink in the catheter at the expected location of the skin surface. Correlate with catheter function. Electronically Signed   By: Valetta Mole M.D.   On: 04/15/2022 13:27   DG Chest Port 1 View  Result Date: 04/09/2022 CLINICAL DATA:  A 66 year old male presents for evaluation of dialysis catheter placement. EXAM: PORTABLE CHEST 1 VIEW COMPARISON:  February 24, 2022. FINDINGS: RIGHT IJ dialysis catheter has been placed tip in the area of the mid superior vena cava. Post median sternotomy for CABG. Cardiomediastinal contours and hilar structures are stable. EKG leads project over the chest. No signs of lobar consolidation though there is subtle retrocardiac airspace disease that has developed since previous imaging. No pneumothorax. No gross pleural effusion. On limited assessment there is no acute skeletal process. IMPRESSION: 1. Interval placement of RIGHT IJ dialysis catheter with tip in the area of the mid superior vena cava. 2. Subtle retrocardiac airspace disease,  potentially atelectasis. Correlate with any signs of infection. Electronically Signed   By: Zetta Bills M.D.   On: 04/09/2022 09:48   DG C-Arm 1-60 Min-No Report  Result Date: 04/09/2022 Fluoroscopy was utilized by the requesting physician.  No radiographic interpretation.    Microbiology: Results for orders placed  or performed during the hospital encounter of 04/15/22  Resp Panel by RT-PCR (Flu A&B, Covid) Anterior Nasal Swab     Status: None   Collection Time: 04/15/22 12:58 PM   Specimen: Anterior Nasal Swab  Result Value Ref Range Status   SARS Coronavirus 2 by RT PCR NEGATIVE NEGATIVE Final    Comment: (NOTE) SARS-CoV-2 target nucleic acids are NOT DETECTED.  The SARS-CoV-2 RNA is generally detectable in upper respiratory specimens during the acute phase of infection. The lowest concentration of SARS-CoV-2 viral copies this assay can detect is 138 copies/mL. A negative result does not preclude SARS-Cov-2 infection and should not be used as the sole basis for treatment or other patient management decisions. A negative result may occur with  improper specimen collection/handling, submission of specimen other than nasopharyngeal swab, presence of viral mutation(s) within the areas targeted by this assay, and inadequate number of viral copies(<138 copies/mL). A negative result must be combined with clinical observations, patient history, and epidemiological information. The expected result is Negative.  Fact Sheet for Patients:  EntrepreneurPulse.com.au  Fact Sheet for Healthcare Providers:  IncredibleEmployment.be  This test is no t yet approved or cleared by the [place] FDA and  has been authorized for detection and/or diagnosis of SARS-CoV-2 by FDA under an Emergency Use Authorization (EUA). This EUA will remain  in effect (meaning this test can be used) for the duration of the COVID-19 declaration under Section 564(b)(1) of the  Act, 21 U.S.C.section 360bbb-3(b)(1), unless the authorization is terminated  or revoked sooner.       Influenza A by PCR NEGATIVE NEGATIVE Final   Influenza B by PCR NEGATIVE NEGATIVE Final    Comment: (NOTE) The Xpert Xpress SARS-CoV-2/FLU/RSV plus assay is intended as an aid in the diagnosis of influenza from Nasopharyngeal swab specimens and should not be used as a sole basis for treatment. Nasal washings and aspirates are unacceptable for Xpert Xpress SARS-CoV-2/FLU/RSV testing.  Fact Sheet for Patients: EntrepreneurPulse.com.au  Fact Sheet for Healthcare Providers: IncredibleEmployment.be  This test is not yet approved or cleared by the [place] FDA and has been authorized for detection and/or diagnosis of SARS-CoV-2 by FDA under an Emergency Use Authorization (EUA). This EUA will remain in effect (meaning this test can be used) for the duration of the COVID-19 declaration under Section 564(b)(1) of the Act, 21 U.S.C. section 360bbb-3(b)(1), unless the authorization is terminated or revoked.  Performed at [facility], [place] [address] [place],  [address]     Labs: CBC: Recent Labs  Lab 04/15/22 1328 04/15/22 2147 04/16/22 0154  WBC 5.5 4.5 4.5  NEUTROABS 3.6  --   --   HGB 10.7* 10.7* 11.1*  HCT 31.7* 31.8* 32.9*  MCV 91.1 91.4 90.9  PLT 116* 119* 856*   Basic Metabolic Panel: Recent Labs  Lab 04/15/22 1328 04/16/22 0154  NA 135 135  K 4.1 5.0  CL 97* 96*  CO2 29 28  GLUCOSE 167* 130*  BUN 17 31*  CREATININE 5.02* 6.41*  CALCIUM 8.4* 8.8*   Liver Function Tests: Recent Labs  Lab 04/15/22 1328  AST 29  ALT 7  ALKPHOS 55  BILITOT 0.9  PROT 7.2  ALBUMIN 3.3*   CBG: Recent Labs  Lab 04/16/22 1016 04/16/22 1217 04/16/22 1609 04/16/22 2129 04/17/22 0754  GLUCAP 159* 88 164* 313* 160*    Discharge time spent: 35 minutes.  Signed: Cordelia Poche, MD [facility] 04/17/2022

## 2022-04-16 NOTE — Progress Notes (Signed)
AVF duplex completed. Refer to "CV Proc" under chart review to view preliminary results.  04/16/2022 4:23 PM Kelby Aline., MHA, RVT, RDCS, RDMS

## 2022-04-16 NOTE — Progress Notes (Signed)
Brief nephrology Obs note.  He is in observation status. I have seen and examined the patient at bedside.  Also met with family members.  He has no complaint.  No nausea, vomiting, chest pain, shortness of breath.  Plan to get HD catheter exchange today.  If he remains in the hospital till tomorrow then we will plan to do dialysis here.  If he'll be discharged after catheter exchange then he can resume outpatient dialysis tomorrow.   OP HD orders:  MWF - AF 3.75hrs, 400/500 2K 2Ca P4 Sensipar $RemoveBef'60mg'nrrczXJddA$  qHD Hectorol 63mcg qHD No Heparin

## 2022-04-17 ENCOUNTER — Other Ambulatory Visit: Payer: Medicare Other | Admitting: Neurology

## 2022-04-17 ENCOUNTER — Encounter (HOSPITAL_COMMUNITY): Payer: Self-pay | Admitting: Vascular Surgery

## 2022-04-17 DIAGNOSIS — T829XXA Unspecified complication of cardiac and vascular prosthetic device, implant and graft, initial encounter: Secondary | ICD-10-CM | POA: Diagnosis not present

## 2022-04-17 DIAGNOSIS — D649 Anemia, unspecified: Secondary | ICD-10-CM | POA: Diagnosis not present

## 2022-04-17 DIAGNOSIS — T82598A Other mechanical complication of other cardiac and vascular devices and implants, initial encounter: Secondary | ICD-10-CM | POA: Diagnosis not present

## 2022-04-17 DIAGNOSIS — I25118 Atherosclerotic heart disease of native coronary artery with other forms of angina pectoris: Secondary | ICD-10-CM | POA: Diagnosis not present

## 2022-04-17 DIAGNOSIS — I480 Paroxysmal atrial fibrillation: Secondary | ICD-10-CM | POA: Diagnosis not present

## 2022-04-17 LAB — CBC
HCT: 24.5 % — ABNORMAL LOW (ref 39.0–52.0)
HCT: 26.1 % — ABNORMAL LOW (ref 39.0–52.0)
Hemoglobin: 8.3 g/dL — ABNORMAL LOW (ref 13.0–17.0)
Hemoglobin: 8.8 g/dL — ABNORMAL LOW (ref 13.0–17.0)
MCH: 31 pg (ref 26.0–34.0)
MCH: 30.4 pg (ref 26.0–34.0)
MCHC: 33.9 g/dL (ref 30.0–36.0)
MCHC: 33.7 g/dL (ref 30.0–36.0)
MCV: 91.4 fL (ref 80.0–100.0)
MCV: 90.3 fL (ref 80.0–100.0)
Platelets: 110 10*3/uL — ABNORMAL LOW (ref 150–400)
Platelets: 110 10*3/uL — ABNORMAL LOW (ref 150–400)
RBC: 2.68 MIL/uL — ABNORMAL LOW (ref 4.22–5.81)
RBC: 2.89 MIL/uL — ABNORMAL LOW (ref 4.22–5.81)
RDW: 14.3 % (ref 11.5–15.5)
RDW: 14.1 % (ref 11.5–15.5)
WBC: 5.6 10*3/uL (ref 4.0–10.5)
WBC: 4.8 10*3/uL (ref 4.0–10.5)
nRBC: 0 % (ref 0.0–0.2)
nRBC: 0 % (ref 0.0–0.2)

## 2022-04-17 LAB — RENAL FUNCTION PANEL
Albumin: 3.1 g/dL — ABNORMAL LOW (ref 3.5–5.0)
Anion gap: 15 (ref 5–15)
BUN: 63 mg/dL — ABNORMAL HIGH (ref 8–23)
CO2: 24 mmol/L (ref 22–32)
Calcium: 8.9 mg/dL (ref 8.9–10.3)
Chloride: 91 mmol/L — ABNORMAL LOW (ref 98–111)
Creatinine, Ser: 10.41 mg/dL — ABNORMAL HIGH (ref 0.61–1.24)
GFR, Estimated: 5 mL/min — ABNORMAL LOW (ref >60)
Glucose, Bld: 242 mg/dL — ABNORMAL HIGH (ref 70–99)
Phosphorus: 7.1 mg/dL — ABNORMAL HIGH (ref 2.5–4.6)
Potassium: 5.1 mmol/L (ref 3.5–5.1)
Sodium: 130 mmol/L — ABNORMAL LOW (ref 135–145)

## 2022-04-17 LAB — HEPATITIS C ANTIBODY: HCV Ab: NONREACTIVE

## 2022-04-17 LAB — HEPATITIS B CORE ANTIBODY, TOTAL: Hep B Core Total Ab: REACTIVE — AB

## 2022-04-17 LAB — GLUCOSE, CAPILLARY
Glucose-Capillary: 160 mg/dL — ABNORMAL HIGH (ref 70–99)
Glucose-Capillary: 220 mg/dL — ABNORMAL HIGH (ref 70–99)
Glucose-Capillary: 208 mg/dL — ABNORMAL HIGH (ref 70–99)
Glucose-Capillary: 243 mg/dL — ABNORMAL HIGH (ref 70–99)

## 2022-04-17 LAB — HEPATITIS B SURFACE ANTIGEN: Hepatitis B Surface Ag: NONREACTIVE

## 2022-04-17 LAB — HEPATITIS B SURFACE ANTIBODY,QUALITATIVE: Hep B S Ab: REACTIVE — AB

## 2022-04-17 MED ORDER — HEPARIN SODIUM (PORCINE) 1000 UNIT/ML IJ SOLN
INTRAMUSCULAR | Status: AC
  Filled 2022-04-17: qty 3

## 2022-04-17 MED ORDER — APIXABAN 5 MG PO TABS: ORAL_TABLET | ORAL | Status: DC

## 2022-04-17 MED ORDER — CHLORHEXIDINE GLUCONATE CLOTH 2 % EX PADS: 6.0000 | MEDICATED_PAD | Freq: Every day | CUTANEOUS | Status: DC

## 2022-04-17 NOTE — Progress Notes (Signed)
[facility] NEPHROLOGY PROGRESS NOTE  Assessment/ Plan:  OP HD orders:  MWF - AF 3.75hrs, 400/500 2K 2Ca P4 Sensipar 60mg  qHD Hectorol 104mcg qHD No Heparin  #Hemodialysis access: Presented with increased bleeding from the left fistula incision site.  The patient was seen by vascular surgeon and underwent exploration of the left arm AV fistula revision and exchange of right IJ TDC.  No sign of bleeding.  Per vascular patient can go home with [facility] and has outpatient follow-up arranged on 8/22 for incision check.  # ESRD MWF: Plan for regular dialysis today.  UF as tolerated.  # Anemia: Hemoglobin at goal.  Continue to monitor.  # Secondary hyperparathyroidism: Monitor calcium phosphorus level.  On PhosLo.  Resume Sensipar and Hectorol as outpatient.  He will likely be discharged today.  # HTN/volume BP elevated before dialysis, UF as tolerated.  Continue current cardiac medications.  Ok to discharge from renal perspective.  Discussed with the primary team  Subjective: Seen and examined at bedside.  Denies nausea, vomiting, chest pain, shortness of breath. Objective Vital signs in last 24 hours: Vitals:   04/17/22 1102 04/17/22 1300 04/17/22 1310 04/17/22 1330  BP: (!) 151/54 (!) 157/59 (!) 148/55 (!) 166/60  Pulse:  74 72 70  Resp: 17 15 18 13   Temp:  98.8 F (37.1 C)    TempSrc:  Oral    SpO2:  97% 96% 98%  Weight:      Height:       Weight change: 0 kg  Intake/Output Summary (Last 24 hours) at 04/17/2022 1331 Last data filed at 04/16/2022 1600 Gross per 24 hour  Intake 50 ml  Output --  Net 50 ml       Labs: RENAL PANEL Recent Labs    07/28/21 0724 07/28/21 1944 07/29/21 0355 07/30/21 0048 01/05/22 1623 02/21/22 1250 02/21/22 1300 02/22/22 0344 02/23/22 0231 02/24/22 1243 02/25/22 0414 02/26/22 0208 03/31/22 1121 04/09/22 0637 04/15/22 1328 04/16/22 0154  NA 132*   < > 136 136  --  132* 131* 131* 125* 133* 132* 133* 129* 133* 135 135   K >7.5*   < > 4.6 4.7  --  3.2* 3.3* 3.4* 3.4* 3.9 3.6 3.7 4.6 4.6 4.1 5.0  CL 92*   < > 97* 97*  --  91*  --  91* 89* 94* 98 93* 88* 90* 97* 96*  CO2 24   < > 27 28  --  31  --  28 25 26 27 29 30   --  29 28  GLUCOSE 213*   < > 95 159*  --  252*  --  196* 159* 217* 134* 99 316* 200* 167* 130*  BUN 56*   < > 21 34*  --  23  --  26* 35* 26* 29* 15 43* 26* 17 31*  CREATININE 10.57*   < > 5.46* 7.61*  --  5.73*  --  6.89* 8.68* 7.23* 8.63* 5.53* 6.86* 6.20* 5.02* 6.41*  CALCIUM 9.3   < > 8.3* 8.3*  --  8.3*  --  7.9* 8.1* 8.8* 8.4* 8.9 8.5  --  8.4* 8.8*  MG 3.1*  --   --   --   --   --   --   --   --   --   --   --   --   --   --   --   PHOS  --   --  4.7* 4.9*  --   --   --   --  2.5  --  2.9  --   --   --   --   --   ALBUMIN 3.9  --  3.1* 3.0* 4.1 3.1*  --   --  2.3*  --   --   --   --   --  3.3*  --    < > = values in this interval not displayed.     Liver Function Tests: Recent Labs  Lab 04/15/22 1328  AST 29  ALT 7  ALKPHOS 55  BILITOT 0.9  PROT 7.2  ALBUMIN 3.3*   No results for input(s): "LIPASE", "AMYLASE" in the last 168 hours. No results for input(s): "AMMONIA" in the last 168 hours. CBC: Recent Labs    03/31/22 1121 04/09/22 0637 04/15/22 1328 04/15/22 2147 04/16/22 0154  HGB 12.5* 13.3 10.7* 10.7* 11.1*  MCV 96.1  --  91.1 91.4 90.9    Cardiac Enzymes: No results for input(s): "CKTOTAL", "CKMB", "CKMBINDEX", "TROPONINI" in the last 168 hours. CBG: Recent Labs  Lab 04/16/22 1217 04/16/22 1609 04/16/22 2129 04/17/22 0754 04/17/22 1237  GLUCAP 88 164* 313* 160* 220*    Iron Studies: No results for input(s): "IRON", "TIBC", "TRANSFERRIN", "FERRITIN" in the last 72 hours. Studies/Results: VAS US DUPLEX DIALYSIS ACCESS (AVF, AVG)  Result Date: 04/16/2022 DIALYSIS ACCESS Patient Name:  Kenneth [name] [facility]  Date of Exam:   04/16/2022 Medical Rec #: [id number]    Accession #:    [id number] Date of Birth: 01-22-1956   Patient Gender: M Patient Age:   66 years Exam Location:   [facility] Procedure:      VAS US DUPLEX DIALYSIS ACCESS (AVF, AVG) Referring Phys: Vonna Kotyk ROBINS --------------------------------------------------------------------------------  Reason for Exam: Bleeding AVF. Access Site: Left Upper Extremity. Access Type: Brachial-cephalic AVF. Comparison Study: No prior study Performing Technologist: Maudry Mayhew MHA, RDMS, RVT, RDCS  Examination Guidelines: A complete evaluation includes B-mode imaging, spectral Doppler, color Doppler, and power Doppler as needed of all accessible portions of each vessel. Unilateral testing is considered an integral part of a complete examination. Limited examinations for reoccurring indications may be performed as noted.  Findings: +--------------------+----------+-----------------+--------+ AVF                 PSV (cm/s)Flow Vol (mL/min)Comments +--------------------+----------+-----------------+--------+ Native artery inflow   203           444                +--------------------+----------+-----------------+--------+ AVF Anastomosis        121                              +--------------------+----------+-----------------+--------+  +------------+----------+-------------+----------+-----------------------------+ OUTFLOW VEINPSV (cm/s)Diameter (cm)Depth (cm)          Describe            +------------+----------+-------------+----------+-----------------------------+ Prox UA         39        0.81        1.79                                 +------------+----------+-------------+----------+-----------------------------+ Mid UA         595                    1.43    0.84cm diameter with 0.4cm  residual diameter (stenotic)  +------------+----------+-------------+----------+-----------------------------+ Dist UA         67        1.19        0.20                                  +------------+----------+-------------+----------+-----------------------------+   Summary: Arteriovenous fistula-Stenosis noted. *See table(s) above for measurements and observations.  Diagnosing physician: Jamelle Haring Electronically signed by Jamelle Haring on 04/16/2022 at 4:31:59 PM.   --------------------------------------------------------------------------------   Final    DG Chest 1 View  Result Date: 04/16/2022 CLINICAL DATA:  A 66 year old male presents for evaluation of central venous access. EXAM: CHEST  1 VIEW COMPARISON:  April 15, 2022 FINDINGS: Since the August 2nd exam the catheter has been advanced to the distal superior vena cava area. This catheter enters via RIGHT IJ approach. EKG leads project over the chest. Post median sternotomy for CABG. No lobar consolidation. No pneumothorax or pleural effusion. No sign of pulmonary edema. On limited assessment no acute skeletal process. Signs of LEFT axillary vascular stenting as before. IMPRESSION: Post exchange and or advancement of the RIGHT IJ dialysis catheter, tip in the area of the distal superior vena cava. Electronically Signed   By: Zetta Bills M.D.   On: 04/16/2022 13:02   HYBRID OR IMAGING ([facility] ONLY)  Result Date: 04/16/2022 There is no interpretation for this exam.  This order is for images obtained during a surgical procedure.  Please See "Surgeries" Tab for more information regarding the procedure.    Medications: Infusions:  sodium chloride      Scheduled Medications:  arformoterol  15 mcg Nebulization BID   And   umeclidinium bromide  1 puff Inhalation Daily   atorvastatin  80 mg Oral Daily   calcium acetate  667 mg Oral TID WC   carvedilol  6.25 mg Oral 2 times per day on Sun Tue Thu Sat   carvedilol  6.25 mg Oral Q M,W,F-2000   Chlorhexidine Gluconate Cloth  6 each Topical Q0600   ezetimibe  10 mg Oral Daily   insulin aspart  0-6 Units Subcutaneous TID WC   isosorbide mononitrate  90 mg Oral Once per day on Sun  Tue Thu Sat   levETIRAcetam  250 mg Oral Q M,W,F-2000   levETIRAcetam  500 mg Oral QHS   megestrol  400 mg Oral BID   pantoprazole  40 mg Oral BID   sertraline  50 mg Oral Daily   sodium chloride flush  3 mL Intravenous Q12H   sodium zirconium cyclosilicate  5 g Oral Once per day on Sun Tue Thu Sat    have reviewed scheduled and prn medications.  Physical Exam: General:NAD, comfortable Heart:RRR, s1s2 nl Lungs:clear b/l, no crackle Abdomen:soft, Non-tender, non-distended Extremities:No edema Dialysis Access: Right IJ TDC with dressing on, no bleeding.  AV fistula site has a wound VAC.  Kenneth [name] 04/17/2022,1:31 PM  LOS: 0 days

## 2022-04-17 NOTE — Progress Notes (Signed)
Received patient in bed to unit.  Alert and oriented.  Informed consent signed and in  chart.   Treatment initiated: 1310 Treatment completed: 1712  Patient tolerated well.  Transported back to the room  alert, without acute distress.  Hand-off given to patient's nurse.   Access used: Catheter Access issues: None  Total UF removed: 2000 mls Medication(s) given: None Post HD VS: See chart Post HD weight: 58.7 kg    [name]

## 2022-04-17 NOTE — Discharge Instructions (Signed)
Keep Pravena wound vac on your incision until it loses it seal in about 5-7 days.  Once that happens, you can remove it. Just peel it off like a large Band-Aid and then wash the incision with soap and water and pat dry. (No tub bath-only shower). Do this daily.  Do not use Vaseline or neosporin on your incision. Only use soap and water on your incisions and then protect and keep dry.

## 2022-04-17 NOTE — Progress Notes (Addendum)
  Progress Note    04/17/2022 7:48 AM 1 Day Post-Op  Subjective:  says he is feeling good   Vitals:   04/16/22 2046 04/16/22 2125  BP:  (!) 139/53  Pulse: 69   Resp: 18 17  Temp:    SpO2: 99%    Physical Exam: Cardiac:  regular; right IJ TDC dressings c/d/i Lungs:  non labored  Incisions: Left arm with incisional vac. Good seal. No output Extremities:  well perfused and warm Neurologic: alert and oriented  CBC    Component Value Date/Time   WBC 4.5 04/16/2022 0154   RBC 3.62 (L) 04/16/2022 0154   HGB 11.1 (L) 04/16/2022 0154   HCT 32.9 (L) 04/16/2022 0154   PLT 121 (L) 04/16/2022 0154   MCV 90.9 04/16/2022 0154   MCH 30.7 04/16/2022 0154   MCHC 33.7 04/16/2022 0154   RDW 13.9 04/16/2022 0154   LYMPHSABS 1.0 04/15/2022 1328   MONOABS 0.6 04/15/2022 1328   EOSABS 0.2 04/15/2022 1328   BASOSABS 0.0 04/15/2022 1328    BMET    Component Value Date/Time   NA 135 04/16/2022 0154   K 5.0 04/16/2022 0154   CL 96 (L) 04/16/2022 0154   CO2 28 04/16/2022 0154   GLUCOSE 130 (H) 04/16/2022 0154   BUN 31 (H) 04/16/2022 0154   CREATININE 6.41 (H) 04/16/2022 0154   CALCIUM 8.8 (L) 04/16/2022 0154   GFRNONAA 9 (L) 04/16/2022 0154   GFRAA 8 (L) 12/12/2019 1010    INR    Component Value Date/Time   INR 1.0 04/15/2022 1328     Intake/Output Summary (Last 24 hours) at 04/17/2022 0748 Last data filed at 04/16/2022 1600 Gross per 24 hour  Intake 50 ml  Output --  Net 50 ml     Assessment/Plan:  66 y.o. male is s/p exchange of right IJ TDC, exploration of left arm AV F revision for bleeding 1 Day Post-Op   Left arm incisional vac with good seal. No output Right IJ TDC dressings c/d/I. For HD today H&H stable VSS Can go home with VAC. Will come off in 7 days Has outpatient follow up arranged on 8/22 for incision check   Karoline Caldwell, PA-C [facility] [phone] 04/17/2022 7:48 AM  VASCULAR STAFF ADDENDUM: I have independently interviewed  and examined the patient. I agree with the above.   Yevonne Aline. Stanford Breed, MD [facility] Phone Number: [phone] 04/17/2022 11:10 AM

## 2022-04-18 ENCOUNTER — Telehealth (HOSPITAL_COMMUNITY): Payer: Self-pay | Admitting: Nephrology

## 2022-04-18 LAB — HEPATITIS B SURFACE ANTIBODY, QUANTITATIVE: Hep B S AB Quant (Post): 631.6 m[IU]/mL (ref >9.9)

## 2022-04-18 NOTE — Telephone Encounter (Signed)
Transition of care contact from inpatient facility  Date of Discharge: 04/17/22 Date of Contact: 04/18/22 Method of contact: Phone  Attempted to contact patient to discuss transition of care from inpatient admission. Patient did not answer the phone. Message was left on the patient's voicemail with call back number [phone].  Veneta Penton, PA-C [employer] Pager [phone]

## 2022-04-29 ENCOUNTER — Other Ambulatory Visit (HOSPITAL_BASED_OUTPATIENT_CLINIC_OR_DEPARTMENT_OTHER): Payer: Self-pay

## 2022-04-29 MED FILL — Atorvastatin Calcium Tab 80 MG (Base Equivalent): ORAL | 90 days supply | Qty: 90 | Fill #1 | Status: AC

## 2022-04-30 ENCOUNTER — Telehealth: Payer: Self-pay

## 2022-04-30 NOTE — Telephone Encounter (Signed)
Patient has appointment for pump training . Is this for the [employer] or does order need to be sent for [employer] .

## 2022-05-05 ENCOUNTER — Encounter: Payer: Self-pay | Admitting: Family Medicine

## 2022-05-05 ENCOUNTER — Encounter: Payer: Self-pay | Admitting: Physician Assistant

## 2022-05-05 ENCOUNTER — Ambulatory Visit (INDEPENDENT_AMBULATORY_CARE_PROVIDER_SITE_OTHER): Payer: Medicare Other | Admitting: Physician Assistant

## 2022-05-05 ENCOUNTER — Ambulatory Visit (INDEPENDENT_AMBULATORY_CARE_PROVIDER_SITE_OTHER): Payer: Medicare Other | Admitting: Family Medicine

## 2022-05-05 VITALS — BP 103/49 | HR 67 | Temp 98.6°F | Resp 20 | Ht 65.0 in | Wt 129.2 lb

## 2022-05-05 VITALS — BP 114/64 | HR 69 | Temp 98.3°F | Ht 65.0 in | Wt 130.2 lb

## 2022-05-05 DIAGNOSIS — D649 Anemia, unspecified: Secondary | ICD-10-CM

## 2022-05-05 DIAGNOSIS — Z09 Encounter for follow-up examination after completed treatment for conditions other than malignant neoplasm: Secondary | ICD-10-CM

## 2022-05-05 DIAGNOSIS — N186 End stage renal disease: Secondary | ICD-10-CM

## 2022-05-05 DIAGNOSIS — Z992 Dependence on renal dialysis: Secondary | ICD-10-CM

## 2022-05-05 DIAGNOSIS — E871 Hypo-osmolality and hyponatremia: Secondary | ICD-10-CM

## 2022-05-05 NOTE — Progress Notes (Signed)
POST OPERATIVE OFFICE NOTE    CC:  F/u for surgery  HPI:  This is a 66 y.o. male who is s/p plication of left arm arteriovenous fistula and placement of [facility] 04/09/22 by Dr. Stanford Breed followed by  exploration of the revised for persistent oozing from this incision he also needed exchange of the [facility] right IJ due to malfunction.  on 04/16/22 by Dr. Stanford Breed.    Pt returns today for follow up.  Pt states he has no new complaints and he denies fever or chills.  The right [facility] is working well.  He has HD MWF @ [facility].     No Known Allergies  Current Outpatient Medications  Medication Sig Dispense Refill   apixaban (ELIQUIS) 5 MG TABS tablet Take 1 tablet by mouth 2 times daily 60 tablet    atorvastatin (LIPITOR) 80 MG tablet Take 1 tablet (80 mg total) by mouth daily. 90 tablet 3   calcium acetate (PHOSLO) 667 MG capsule Take 667 mg by mouth 3 (three) times daily with meals.      carvedilol (COREG) 6.25 MG tablet Take 1 tablet (6.25 mg total) by mouth 2 (two) times daily with a meal. Hold on the mornings of dialysis. 60 tablet 3   Continuous Blood Gluc Sensor (FREESTYLE LIBRE 2 SENSOR) MISC 1 Device by Does not apply route every 14 (fourteen) days. 6 each 3   diclofenac Sodium (VOLTAREN) 1 % GEL Apply 4 grams topically 4 (four) times daily. 100 g 2   ezetimibe (ZETIA) 10 MG tablet Take 1 tablet by mouth daily 90 tablet 3   glucose blood (ONETOUCH VERIO) test strip 1 each by Other route in the morning, at noon, in the evening, and at bedtime. Use as instructed 400 each 2   hydrALAZINE (APRESOLINE) 100 MG tablet Take 1 tablet (100 mg total) by mouth 3 (three) times daily. DO NOT TAKE AM AND NOON DOSES ON HD DAYS 270 tablet 2   Insulin Lispro-aabc (LYUMJEV) 100 UNIT/ML SOLN Max Daily 30 units per insulin pump 30 mL 6   Insulin Pen Needle (PEN NEEDLES) 32G X 6 MM MISC Use as directed 3 times daily 300 each 1   isosorbide mononitrate (IMDUR) 60 MG 24 hr tablet Take 1&1/2 tablets (90 mg total) by mouth  daily. Hold on days of dialysis on Mon, Wed, Fri. 90 tablet 5   levETIRAcetam (KEPPRA) 250 MG tablet Take 1 tablet (250 mg total) by mouth every Monday, Wednesday, and Friday at 8 PM. 12 tablet 0   levETIRAcetam (KEPPRA) 500 MG tablet Take 1 tablet (500 mg total) by mouth at bedtime. 30 tablet 0   megestrol (MEGACE ES) 625 MG/5ML suspension Take 5 mLs (625 mg total) by mouth daily. 150 mL 1   Methoxy PEG-Epoetin Beta (MIRCERA IJ) Inject as directed.     pantoprazole (PROTONIX) 40 MG tablet Take 1 tablet (40 mg total) by mouth 2 (two) times daily. 180 tablet 1   sertraline (ZOLOFT) 50 MG tablet Take 1 tablet (50 mg total) by mouth daily. 90 tablet 2   Sodium Zirconium Cyclosilicate (LOKELMA PO) Take 1 packet by mouth 4 (four) times a week. Hold on dialysis days     Tiotropium Bromide-Olodaterol (STIOLTO RESPIMAT) 2.5-2.5 MCG/ACT AERS Inhale 2 puffs by mouth into the lungs daily. 4 g 11   No current facility-administered medications for this visit.     ROS:  See HPI  Physical Exam:    Incision:  Left UE well healed incision.  Staples removed  Extremities:  Left UE palpable thrill in fistula, grip 5/5 equal B UE and sensation intact Lungs:  non labored breathing    Assessment/Plan:  This is a 66 y.o. male who is s/p:plication of left arm arteriovenous fistula and placement of [facility] 04/09/22 by Dr. Stanford Breed followed by  exploration of the revised for persistent oozing from this incision he also needed exchange of the [facility] right IJ due to malfunction.  on 04/16/22 by Dr. Stanford Breed.    On re-exploration there was no active bleeding the skin was closed again.  He come in today with hematoma surrounding the new incision site and pain.  HD should not stick at new incision site.  I have marked the no stick site today in office.  We will not stick the new incision site for additional 3 weeks to rest the fistula.  It may be used distally to the new incision.  If his HD is interrupted or the fistula is not usable  he will need a TDC placed.  F/U PRN.      Roxy Horseman PA-C [facility] [phone]   Clinic MD:  Stanford Breed

## 2022-05-05 NOTE — Progress Notes (Signed)
Chief Complaint  Patient presents with   Hospitalization Follow-up    Subjective: Patient is a 66 y.o. male here for hosp f/u. Here w spouse and w aid of [place] video interpreter.   Patient was admitted to the hospital on 8/2 and discharged on 1/0/2585 for complication associated with his dialysis catheter in his left upper extremity.  Vascular surgery saw the patient and performed AV fistula exploration for revision and TDC exchange.  Wound VAC was applied to the wound.  He reports feeling much better.  The wound VAC is out and his staples have been removed from the surgical site.  He is not having any drainage, pain, or fevers.  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  No visible oozing/drainage, erythema, or TTP. Lungs: CTAB, no rales, wheezes or rhonchi. No accessory muscle use Psych: Age appropriate judgment and insight, normal affect and mood  Assessment and Plan: Hospital discharge follow-up  Chronic anemia - Plan: CBC  Hyponatremia - Plan: Basic metabolic  panel  We will follow-up on hospital labs today.  He saw the vascular surgery team and is following up appropriately.  I will see him in around 5 months for his physical. The patient and his wife through the interpreter voiced understanding and agreement to the plan.  I spent 22 minutes with the patient discussing the above plan in addition to reviewing his chart/hospitalization on the same date of visit.  [place], DO 05/05/22  3:48 PM

## 2022-05-05 NOTE — Patient Instructions (Addendum)
Give us 2-3 business days to get the results of your labs back.   Keep the diet clean and stay active.  Let us know if you need anything. 

## 2022-05-06 ENCOUNTER — Telehealth: Payer: Self-pay

## 2022-05-06 LAB — CBC
HCT: 32.8 % — ABNORMAL LOW (ref 39.0–52.0)
Hemoglobin: 11.1 g/dL — ABNORMAL LOW (ref 13.0–17.0)
MCHC: 33.8 g/dL (ref 30.0–36.0)
MCV: 95 fl (ref 78.0–100.0)
Platelets: 210 10*3/uL (ref 150.0–400.0)
RBC: 3.45 Mil/uL — ABNORMAL LOW (ref 4.22–5.81)
RDW: 16.6 % — ABNORMAL HIGH (ref 11.5–15.5)
WBC: 7.3 10*3/uL (ref 4.0–10.5)

## 2022-05-06 LAB — BASIC METABOLIC PANEL
BUN: 28 mg/dL — ABNORMAL HIGH (ref 6–23)
CO2: 30 mEq/L (ref 19–32)
Calcium: 8.5 mg/dL (ref 8.4–10.5)
Chloride: 90 mEq/L — ABNORMAL LOW (ref 96–112)
Creatinine, Ser: 7.03 mg/dL (ref 0.40–1.50)
GFR: 7.64 mL/min — CL (ref >60.00)
Glucose, Bld: 298 mg/dL — ABNORMAL HIGH (ref 70–99)
Potassium: 4.3 mEq/L (ref 3.5–5.1)
Sodium: 132 mEq/L — ABNORMAL LOW (ref 135–145)

## 2022-05-06 NOTE — Telephone Encounter (Signed)
CRITICAL VALUE STICKER  CRITICAL VALUE: Creatinine 7.03, GFR 7.64  RECEIVER (on-site recipient of call): Lonnetta Kniskern, RMA  DATE & TIME NOTIFIED: 11:24 am  MESSENGER (representative from lab): Festus Holts  MD NOTIFIED: Riki Sheer, DO  TIME OF NOTIFICATION: 11:26 am  RESPONSE:

## 2022-05-07 ENCOUNTER — Other Ambulatory Visit: Payer: Self-pay | Admitting: Cardiology

## 2022-05-07 ENCOUNTER — Other Ambulatory Visit (HOSPITAL_BASED_OUTPATIENT_CLINIC_OR_DEPARTMENT_OTHER): Payer: Self-pay

## 2022-05-08 ENCOUNTER — Other Ambulatory Visit (HOSPITAL_BASED_OUTPATIENT_CLINIC_OR_DEPARTMENT_OTHER): Payer: Self-pay

## 2022-05-08 MED ORDER — EZETIMIBE 10 MG PO TABS
ORAL_TABLET | ORAL | 3 refills | Status: DC
  Filled 2022-05-08: qty 90, 90d supply, fill #0
  Filled 2022-08-22: qty 90, 90d supply, fill #1
  Filled 2022-12-16: qty 90, 90d supply, fill #2
  Filled 2023-03-14: qty 90, 90d supply, fill #3

## 2022-05-12 ENCOUNTER — Other Ambulatory Visit (HOSPITAL_BASED_OUTPATIENT_CLINIC_OR_DEPARTMENT_OTHER): Payer: Self-pay

## 2022-05-20 ENCOUNTER — Other Ambulatory Visit: Payer: Self-pay | Admitting: Family Medicine

## 2022-05-20 ENCOUNTER — Other Ambulatory Visit (HOSPITAL_BASED_OUTPATIENT_CLINIC_OR_DEPARTMENT_OTHER): Payer: Self-pay

## 2022-05-20 MED ORDER — APIXABAN 5 MG PO TABS
5.0000 mg | ORAL_TABLET | Freq: Two times a day (BID) | ORAL | 11 refills | Status: DC
  Filled 2022-05-20: qty 60, 30d supply, fill #0
  Filled 2022-06-19: qty 60, 30d supply, fill #1
  Filled 2022-07-27: qty 60, 30d supply, fill #2

## 2022-05-20 MED ORDER — LEVETIRACETAM 250 MG PO TABS
250.0000 mg | ORAL_TABLET | ORAL | 0 refills | Status: DC
  Filled 2022-05-20: qty 12, 28d supply, fill #0

## 2022-05-20 NOTE — Telephone Encounter (Signed)
Refilled by another provider.

## 2022-05-21 ENCOUNTER — Other Ambulatory Visit: Payer: Self-pay | Admitting: *Deleted

## 2022-05-21 DIAGNOSIS — N186 End stage renal disease: Secondary | ICD-10-CM

## 2022-05-25 NOTE — H&P (View-Only) (Signed)
POST OPERATIVE OFFICE NOTE    CC:  F/u for surgery  HPI:  This is a 66 y.o. male who has had a left brachiocephalic arteriovenous fistula created for him before 2020.  s/p  plication of left arm arteriovenous fistula and placement of [facility] 04/09/22 by Dr. Stanford Breed followed by  exploration of the revised for persistent oozing from this incision he also needed exchange of the [facility] right IJ due to malfunction on 04/16/22 by Dr. Stanford Breed.    He was last seen in our office on 05/05/22 with hematoma surrounding the incision.  He has a working [employer].  I advised rest for 3 weeks and follow up incision check.  He is here for follow up and duplex of the fistula.  He denies pain, los of motor and loss of sensation.     No Known Allergies  Current Outpatient Medications  Medication Sig Dispense Refill   apixaban (ELIQUIS) 5 MG TABS tablet Take 1 tablet by mouth 2 times daily 60 tablet    apixaban (ELIQUIS) 5 MG TABS tablet Take 1 tablet (5 mg total) by mouth 2 (two) times daily. 60 tablet 11   atorvastatin (LIPITOR) 80 MG tablet Take 1 tablet (80 mg total) by mouth daily. 90 tablet 3   calcium acetate (PHOSLO) 667 MG capsule Take 667 mg by mouth 3 (three) times daily with meals.      carvedilol (COREG) 6.25 MG tablet Take 1 tablet (6.25 mg total) by mouth 2 (two) times daily with a meal. Hold on the mornings of dialysis. 60 tablet 3   Continuous Blood Gluc Sensor (FREESTYLE LIBRE 2 SENSOR) MISC 1 Device by Does not apply route every 14 (fourteen) days. 6 each 3   Darbepoetin Alfa (ARANESP, ALBUMIN FREE, IJ) Darbepoetin Alfa (Aranesp)     diclofenac Sodium (VOLTAREN) 1 % GEL Apply 4 grams topically 4 (four) times daily. 100 g 2   ezetimibe (ZETIA) 10 MG tablet Take 1 tablet by mouth daily 90 tablet 3   glucose blood (ONETOUCH VERIO) test strip 1 each by Other route in the morning, at noon, in the evening, and at bedtime. Use as instructed 400 each 2   heparin 1000 unit/mL SOLN injection Heparin Sodium (Porcine)  1,000 Units/mL Catheter Lock Venous     hydrALAZINE (APRESOLINE) 100 MG tablet Take 1 tablet (100 mg total) by mouth 3 (three) times daily. DO NOT TAKE AM AND NOON DOSES ON HD DAYS 270 tablet 2   Insulin Lispro-aabc (LYUMJEV) 100 UNIT/ML SOLN Max Daily 30 units per insulin pump 30 mL 6   Insulin Pen Needle (PEN NEEDLES) 32G X 6 MM MISC Use as directed 3 times daily 300 each 1   isosorbide mononitrate (IMDUR) 60 MG 24 hr tablet Take 1&1/2 tablets (90 mg total) by mouth daily. Hold on days of dialysis on Mon, Wed, Fri. 90 tablet 5   levETIRAcetam (KEPPRA) 250 MG tablet Take 1 tablet (250 mg total) by mouth every Monday, Wednesday, and Friday at 8 PM. 12 tablet 0   levETIRAcetam (KEPPRA) 500 MG tablet Take 1 tablet (500 mg total) by mouth at bedtime. 30 tablet 0   megestrol (MEGACE ES) 625 MG/5ML suspension Take 5 mLs (625 mg total) by mouth daily. 150 mL 1   Methoxy PEG-Epoetin Beta (MIRCERA IJ) Inject as directed.     pantoprazole (PROTONIX) 40 MG tablet Take 1 tablet (40 mg total) by mouth 2 (two) times daily. 180 tablet 1   sertraline (ZOLOFT) 50 MG tablet Take  1 tablet (50 mg total) by mouth daily. 90 tablet 2   Sodium Zirconium Cyclosilicate (LOKELMA PO) Take 1 packet by mouth 4 (four) times a week. Hold on dialysis days     Tiotropium Bromide-Olodaterol (STIOLTO RESPIMAT) 2.5-2.5 MCG/ACT AERS Inhale 2 puffs by mouth into the lungs daily. 4 g 11   No current facility-administered medications for this visit.     ROS:  See HPI  Physical Exam:    Incision:  well healed with hematoma Extremities:  Left UE  without edema or hematoma .  Palpable thrill in fistula. Grip 5/5, palpable     Findings:  +--------------------+----------+-----------------+--------+  AVF                 PSV (cm/s)Flow Vol (mL/min)Comments  +--------------------+----------+-----------------+--------+  Native artery inflow   221           750                  +--------------------+----------+-----------------+--------+  AVF Anastomosis        331                               +--------------------+----------+-----------------+--------+      +------------+----------+-------------+----------+-------------------------  ----+  OUTFLOW VEINPSV (cm/s)Diameter (cm)Depth (cm)          Describe              +------------+----------+-------------+----------+-------------------------  ----+  Shoulder       267        0.45        0.40                                   +------------+----------+-------------+----------+-------------------------  ----+  Prox UA        196        0.55        0.44                                   +------------+----------+-------------+----------+-------------------------  ----+  Mid UA         800        1.06        0.45     velocities >800 cm/s  with                                                 partially-occlusive  thrombus                                                           present              +------------+----------+-------------+----------+-------------------------  ----+  Dist UA         57        1.03        0.62                                   +------------+----------+-------------+----------+-------------------------  ----+  AC Fossa       116        0.81        0.18                                   +------------+----------+-------------+----------+-------------------------  ----+     Summary:  Arteriovenous fistula-Thrombus partially-occlusive thrombus observed with  elevated velocities >800 cm/s.     Assessment/Plan:  This is a 66 y.o. male who is s/p: Left AV fistula revision plication followed by persistent drainage and re exploration on 04/16/22 by Dr. Stanford Breed.    He is here for follow up study and incision check.  The fistula duplex showed   -We will schedule him for fistulogram due to retained thrombus and elevated velocities to  see if the fistula can be salvaged.  HD MWF the patient requested a T/T.  Of note he Is on Eliquis 5 mg BID for A fib.     Roxy Horseman PA-C [facility] [phone]   Clinic MD:  Carlis Abbott

## 2022-05-25 NOTE — Progress Notes (Unsigned)
POST OPERATIVE OFFICE NOTE    CC:  F/u for surgery  HPI:  This is a 66 y.o. male who is s/p  plication of left arm arteriovenous fistula and placement of [facility] 04/09/22 by Dr. Lenell Antu followed by  exploration of the revised for persistent oozing from this incision he also needed exchange of the [facility] right IJ due to malfunction.  on 04/16/22 by Dr. Lenell Antu.    He was last seen in our office on 05/05/22 with hematoma surrounding the incision.  He has a working [facility].  I advised rest for 3 weeks and follow up incision check.    Pt returns today for follow up.  Pt states ***   No Known Allergies  Current Outpatient Medications  Medication Sig Dispense Refill   apixaban (ELIQUIS) 5 MG TABS tablet Take 1 tablet by mouth 2 times daily 60 tablet    apixaban (ELIQUIS) 5 MG TABS tablet Take 1 tablet (5 mg total) by mouth 2 (two) times daily. 60 tablet 11   atorvastatin (LIPITOR) 80 MG tablet Take 1 tablet (80 mg total) by mouth daily. 90 tablet 3   calcium acetate (PHOSLO) 667 MG capsule Take 667 mg by mouth 3 (three) times daily with meals.      carvedilol (COREG) 6.25 MG tablet Take 1 tablet (6.25 mg total) by mouth 2 (two) times daily with a meal. Hold on the mornings of dialysis. 60 tablet 3   Continuous Blood Gluc Sensor (FREESTYLE LIBRE 2 SENSOR) MISC 1 Device by Does not apply route every 14 (fourteen) days. 6 each 3   Darbepoetin Alfa (ARANESP, ALBUMIN FREE, IJ) Darbepoetin Alfa (Aranesp)     diclofenac Sodium (VOLTAREN) 1 % GEL Apply 4 grams topically 4 (four) times daily. 100 g 2   ezetimibe (ZETIA) 10 MG tablet Take 1 tablet by mouth daily 90 tablet 3   glucose blood (ONETOUCH VERIO) test strip 1 each by Other route in the morning, at noon, in the evening, and at bedtime. Use as instructed 400 each 2   heparin 1000 unit/mL SOLN injection Heparin Sodium (Porcine) 1,000 Units/mL Catheter Lock Venous     hydrALAZINE (APRESOLINE) 100 MG tablet Take 1 tablet (100 mg total) by mouth 3 (three) times  daily. DO NOT TAKE AM AND NOON DOSES ON HD DAYS 270 tablet 2   Insulin Lispro-aabc (LYUMJEV) 100 UNIT/ML SOLN Max Daily 30 units per insulin pump 30 mL 6   Insulin Pen Needle (PEN NEEDLES) 32G X 6 MM MISC Use as directed 3 times daily 300 each 1   isosorbide mononitrate (IMDUR) 60 MG 24 hr tablet Take 1&1/2 tablets (90 mg total) by mouth daily. Hold on days of dialysis on Mon, Wed, Fri. 90 tablet 5   levETIRAcetam (KEPPRA) 250 MG tablet Take 1 tablet (250 mg total) by mouth every Monday, Wednesday, and Friday at 8 PM. 12 tablet 0   levETIRAcetam (KEPPRA) 500 MG tablet Take 1 tablet (500 mg total) by mouth at bedtime. 30 tablet 0   megestrol (MEGACE ES) 625 MG/5ML suspension Take 5 mLs (625 mg total) by mouth daily. 150 mL 1   Methoxy PEG-Epoetin Beta (MIRCERA IJ) Inject as directed.     pantoprazole (PROTONIX) 40 MG tablet Take 1 tablet (40 mg total) by mouth 2 (two) times daily. 180 tablet 1   sertraline (ZOLOFT) 50 MG tablet Take 1 tablet (50 mg total) by mouth daily. 90 tablet 2   Sodium Zirconium Cyclosilicate (LOKELMA PO) Take 1 packet by mouth  4 (four) times a week. Hold on dialysis days     Tiotropium Bromide-Olodaterol (STIOLTO RESPIMAT) 2.5-2.5 MCG/ACT AERS Inhale 2 puffs by mouth into the lungs daily. 4 g 11   No current facility-administered medications for this visit.     ROS:  See HPI  Physical Exam:  ***  Incision:  *** Extremities:  *** Neuro: *** Abdomen:  ***    Assessment/Plan:  This is a 66 y.o. male who is s/p: ***  -***   Doreatha Massed, [facility] [phone]   Clinic MD:  ***

## 2022-05-26 ENCOUNTER — Encounter: Payer: Medicare Other | Admitting: Vascular Surgery

## 2022-05-26 ENCOUNTER — Ambulatory Visit (HOSPITAL_COMMUNITY)
Admission: RE | Admit: 2022-05-26 | Discharge: 2022-05-26 | Disposition: A | Discharge status: Home / Self Care | Payer: Medicare Other | Source: Ambulatory Visit | Attending: Vascular Surgery | Admitting: Vascular Surgery

## 2022-05-26 ENCOUNTER — Ambulatory Visit (INDEPENDENT_AMBULATORY_CARE_PROVIDER_SITE_OTHER): Payer: Medicare Other | Admitting: Physician Assistant

## 2022-05-26 ENCOUNTER — Other Ambulatory Visit: Payer: Self-pay

## 2022-05-26 VITALS — BP 203/73 | HR 62 | Temp 98.3°F | Resp 20 | Ht 65.0 in | Wt 132.7 lb

## 2022-05-26 DIAGNOSIS — N186 End stage renal disease: Secondary | ICD-10-CM

## 2022-05-26 DIAGNOSIS — Z992 Dependence on renal dialysis: Secondary | ICD-10-CM

## 2022-05-26 DIAGNOSIS — T82868A Thrombosis of vascular prosthetic devices, implants and grafts, initial encounter: Secondary | ICD-10-CM

## 2022-06-02 ENCOUNTER — Ambulatory Visit (HOSPITAL_COMMUNITY)
Admission: RE | Admit: 2022-06-02 | Discharge: 2022-06-02 | Disposition: A | Discharge status: Home / Self Care | Payer: Medicare Other | Attending: Surgery | Admitting: Surgery

## 2022-06-02 ENCOUNTER — Other Ambulatory Visit: Payer: Self-pay

## 2022-06-02 ENCOUNTER — Encounter (HOSPITAL_COMMUNITY): Payer: Self-pay | Admitting: Surgery

## 2022-06-02 ENCOUNTER — Encounter (HOSPITAL_COMMUNITY)
Admission: RE | Disposition: A | Discharge status: Home / Self Care | Payer: Self-pay | Source: Home / Self Care | Attending: Surgery

## 2022-06-02 DIAGNOSIS — Z7901 Long term (current) use of anticoagulants: Secondary | ICD-10-CM | POA: Insufficient documentation

## 2022-06-02 DIAGNOSIS — N186 End stage renal disease: Secondary | ICD-10-CM | POA: Insufficient documentation

## 2022-06-02 DIAGNOSIS — I4891 Unspecified atrial fibrillation: Secondary | ICD-10-CM | POA: Diagnosis not present

## 2022-06-02 DIAGNOSIS — Z992 Dependence on renal dialysis: Secondary | ICD-10-CM | POA: Diagnosis not present

## 2022-06-02 DIAGNOSIS — T82868A Thrombosis of vascular prosthetic devices, implants and grafts, initial encounter: Secondary | ICD-10-CM

## 2022-06-02 DIAGNOSIS — T82898A Other specified complication of vascular prosthetic devices, implants and grafts, initial encounter: Secondary | ICD-10-CM | POA: Diagnosis not present

## 2022-06-02 DIAGNOSIS — Y841 Kidney dialysis as the cause of abnormal reaction of the patient, or of later complication, without mention of misadventure at the time of the procedure: Secondary | ICD-10-CM | POA: Diagnosis not present

## 2022-06-02 DIAGNOSIS — T82858A Stenosis of vascular prosthetic devices, implants and grafts, initial encounter: Secondary | ICD-10-CM | POA: Diagnosis not present

## 2022-06-02 DIAGNOSIS — N185 Chronic kidney disease, stage 5: Secondary | ICD-10-CM | POA: Diagnosis not present

## 2022-06-02 HISTORY — PX: A/V FISTULAGRAM: CATH118298

## 2022-06-02 HISTORY — PX: PERIPHERAL VASCULAR BALLOON ANGIOPLASTY: CATH118281

## 2022-06-02 LAB — POCT I-STAT, CHEM 8
BUN: 20 mg/dL (ref 8–23)
BUN: 30 mg/dL — ABNORMAL HIGH (ref 8–23)
Calcium, Ion: 1.01 mmol/L — ABNORMAL LOW (ref 1.15–1.40)
Calcium, Ion: 1.05 mmol/L — ABNORMAL LOW (ref 1.15–1.40)
Chloride: 96 mmol/L — ABNORMAL LOW (ref 98–111)
Chloride: 97 mmol/L — ABNORMAL LOW (ref 98–111)
Creatinine, Ser: 6.2 mg/dL — ABNORMAL HIGH (ref 0.61–1.24)
Creatinine, Ser: 6.5 mg/dL — ABNORMAL HIGH (ref 0.61–1.24)
Glucose, Bld: 128 mg/dL — ABNORMAL HIGH (ref 70–99)
Glucose, Bld: 130 mg/dL — ABNORMAL HIGH (ref 70–99)
HCT: 37 % — ABNORMAL LOW (ref 39.0–52.0)
HCT: 38 % — ABNORMAL LOW (ref 39.0–52.0)
Hemoglobin: 12.6 g/dL — ABNORMAL LOW (ref 13.0–17.0)
Hemoglobin: 12.9 g/dL — ABNORMAL LOW (ref 13.0–17.0)
Potassium: 4.3 mmol/L (ref 3.5–5.1)
Potassium: 5.7 mmol/L — ABNORMAL HIGH (ref 3.5–5.1)
Sodium: 134 mmol/L — ABNORMAL LOW (ref 135–145)
Sodium: 136 mmol/L (ref 135–145)
TCO2: 33 mmol/L — ABNORMAL HIGH (ref 22–32)
TCO2: 34 mmol/L — ABNORMAL HIGH (ref 22–32)

## 2022-06-02 SURGERY — A/V FISTULAGRAM
Anesthesia: LOCAL | Laterality: Left

## 2022-06-02 MED ORDER — LIDOCAINE HCL (PF) 1 % IJ SOLN
INTRAMUSCULAR | Status: DC | PRN
  Administered 2022-06-02: 2 mL

## 2022-06-02 MED ORDER — IODIXANOL 320 MG/ML IV SOLN
INTRAVENOUS | Status: DC | PRN
  Administered 2022-06-02: 30 mL

## 2022-06-02 MED ORDER — HEPARIN (PORCINE) IN NACL 1000-0.9 UT/500ML-% IV SOLN
INTRAVENOUS | Status: AC
  Filled 2022-06-02: qty 500

## 2022-06-02 MED ORDER — SODIUM CHLORIDE 0.9% FLUSH: 3.0000 mL | INTRAVENOUS | Status: DC | PRN

## 2022-06-02 MED ORDER — SODIUM CHLORIDE 0.9% FLUSH: 3.0000 mL | Freq: Two times a day (BID) | INTRAVENOUS | Status: DC

## 2022-06-02 MED ORDER — LIDOCAINE HCL (PF) 1 % IJ SOLN
INTRAMUSCULAR | Status: AC
  Filled 2022-06-02: qty 30

## 2022-06-02 MED ORDER — SODIUM CHLORIDE 0.9 % IV SOLN: 250.0000 mL | INTRAVENOUS | Status: DC | PRN

## 2022-06-02 MED ORDER — HEPARIN (PORCINE) IN NACL 1000-0.9 UT/500ML-% IV SOLN
INTRAVENOUS | Status: DC | PRN
  Administered 2022-06-02: 500 mL

## 2022-06-02 SURGICAL SUPPLY — 17 items
BAG SNAP BAND KOVER 36X36 (MISCELLANEOUS) ×1 IMPLANT
BALLN MUSTANG 8X80X75 (BALLOONS) ×1
BALLOON MUSTANG 8X80X75 (BALLOONS) IMPLANT
COVER DOME SNAP 22 D (MISCELLANEOUS) ×1 IMPLANT
DCB RANGER 7.0X150 150 (BALLOONS) IMPLANT
KIT ENCORE 26 ADVANTAGE (KITS) IMPLANT
KIT MICROPUNCTURE NIT STIFF (SHEATH) IMPLANT
PROTECTION STATION PRESSURIZED (MISCELLANEOUS) ×2
RANGER DCB 7.0X150 150 (BALLOONS) ×1
SHEATH PINNACLE R/O II 6F 4CM (SHEATH) IMPLANT
SHEATH PROBE COVER 6X72 (BAG) ×1 IMPLANT
STATION PROTECTION PRESSURIZED (MISCELLANEOUS) ×1 IMPLANT
STOPCOCK MORSE 400PSI 3WAY (MISCELLANEOUS) ×1 IMPLANT
TRAY PV CATH (CUSTOM PROCEDURE TRAY) ×1 IMPLANT
TUBING CIL FLEX 10 FLL-RA (TUBING) ×1 IMPLANT
WIRE BENTSON .035X145CM (WIRE) IMPLANT
WIRE G V18X300CM (WIRE) IMPLANT

## 2022-06-02 NOTE — Interval H&P Note (Signed)
History and Physical Interval Note:  06/02/2022 9:08 AM  Kenneth [name]  has presented today for surgery, with the diagnosis of peripheral vascular disease.  The various methods of treatment have been discussed with the patient and family. After consideration of risks, benefits and other options for treatment, the patient has consented to  Procedure(s): A/V Fistulagram (Left) as a surgical intervention.  [name] history has been reviewed, patient examined, no change in status, stable for surgery.  I have reviewed the patient's chart and labs.  Questions were answered to the patient's satisfaction.     Annamarie Major

## 2022-06-02 NOTE — Op Note (Signed)
    Patient name: Kenneth [name] MRN: [id number] DOB: 1956/05/11 Sex: male  06/02/2022 Pre-operative Diagnosis: End-stage renal disease Post-operative diagnosis:  Same Surgeon:  Annamarie Major Procedure Performed:  1.  Ultrasound-guided access, left cephalic vein  2.  Fistulogram  3.  Drug-coated balloon venoplasty, cephalic vein (peripheral)     Indications: This is a 66 year old gentleman with a left upper arm fistula that is functioning well.  He is here for further evaluation  Procedure:  The patient was identified in the holding area and taken to room 8.  The patient was then placed supine on the table and prepped and draped in the usual sterile fashion.  A time out was called.  ultrasound was used to evaluate the fistula.  The vein was patent and compressible.  A digital ultrasound image was acquired.  The fistula was then accessed under ultrasound guidance using a micropuncture needle.  An 018 wire was then asvanced without resistance and a micropuncture sheath was placed.  Contrast injections were then performed through the sheath.  Findings: No evidence of central venous stenosis.  There is a stent at the cephalic vein arch which has approximate 50% stenosis at the proximal and distal edges of the stent.  The upper arm cephalic vein has mild luminal irregularity with several areas of 40 to 50% stenosis.  The arterial venous anastomosis is widely patent   Intervention: After the above images were acquired the decision was made to proceed with intervention.  A 6 French sheath was inserted.  I first performed balloon venoplasty using a drug-coated 7 x 150 Ranger balloon.  Completion imaging showed improved but still suboptimal result and so then I selected an 8 mm Mustang balloon and performed balloon venoplasty throughout the stent in the upper arm fistula.  After completion, there was several areas of residual narrowing but less than 15% so I elected to leave these.  There was a significant  improvement within the fistula.  It was originally pulsatile, and now it had a good thrill to it.  The sheath was removed and the cannulation site was closed with a Monocryl  Impression:  #1  Diffuse multiple areas of stenosis approximately 40 to 50% within this fistula.  These were initially treated with a 7 mm drug-coated balloon, followed by a 8 mm Mustang balloon.  Significant improvement in the thrill within the fistula after intervention.  #2  Patient's fistula is immediately ready for use  #3  For the remains amenable to future interventions.    [name], M.D., [facility] [place] Office: [phone] Pager:  [phone]

## 2022-06-02 NOTE — Progress Notes (Signed)
Dr Trula Slade notified of B/P's and no new orders, per Dr Trula Slade client should take B/P medications when gets home; client's wife notified by interpretor and she voiced understanding

## 2022-06-03 ENCOUNTER — Encounter (HOSPITAL_COMMUNITY): Payer: Self-pay | Admitting: Surgery

## 2022-06-04 ENCOUNTER — Other Ambulatory Visit (HOSPITAL_BASED_OUTPATIENT_CLINIC_OR_DEPARTMENT_OTHER): Payer: Self-pay

## 2022-06-04 ENCOUNTER — Ambulatory Visit (INDEPENDENT_AMBULATORY_CARE_PROVIDER_SITE_OTHER): Payer: Medicare Other | Admitting: Internal Medicine

## 2022-06-04 ENCOUNTER — Encounter: Payer: Self-pay | Admitting: Internal Medicine

## 2022-06-04 ENCOUNTER — Other Ambulatory Visit: Payer: Self-pay | Admitting: Family Medicine

## 2022-06-04 VITALS — BP 134/70 | HR 67 | Ht 65.0 in | Wt 134.0 lb

## 2022-06-04 DIAGNOSIS — E1165 Type 2 diabetes mellitus with hyperglycemia: Secondary | ICD-10-CM

## 2022-06-04 DIAGNOSIS — E1142 Type 2 diabetes mellitus with diabetic polyneuropathy: Secondary | ICD-10-CM | POA: Diagnosis not present

## 2022-06-04 DIAGNOSIS — E1122 Type 2 diabetes mellitus with diabetic chronic kidney disease: Secondary | ICD-10-CM

## 2022-06-04 DIAGNOSIS — Z794 Long term (current) use of insulin: Secondary | ICD-10-CM | POA: Diagnosis not present

## 2022-06-04 DIAGNOSIS — Z992 Dependence on renal dialysis: Secondary | ICD-10-CM

## 2022-06-04 DIAGNOSIS — N186 End stage renal disease: Secondary | ICD-10-CM

## 2022-06-04 LAB — POCT GLYCOSYLATED HEMOGLOBIN (HGB A1C): Hemoglobin A1C: 9 % — AB (ref 4.0–5.6)

## 2022-06-04 MED ORDER — LEVETIRACETAM 500 MG PO TABS
500.0000 mg | ORAL_TABLET | Freq: Every day | ORAL | 5 refills | Status: DC
  Filled 2022-06-04: qty 30, 30d supply, fill #0

## 2022-06-04 NOTE — Progress Notes (Signed)
Name: Kenneth [name]  MRN/ DOB: [id number], April 02, 1956   Age/ Sex: 66 y.o., male    PCP: Shelda Pal, DO   Reason for Endocrinology Evaluation: Type 2 Diabetes Mellitus     Date of Initial Endocrinology Visit: 10/28/2021    PATIENT IDENTIFIER: Kenneth [name] is a 66 y.o. male with a past medical history of T2DM, CAD, CHF , HTN and ESRD on HD since 2017. The patient presented for initial endocrinology clinic visit on 10/28/2021 for consultative assistance with his diabetes management.    HPI: Kenneth [name] transitioned care from Dr. Hartford Poli . He is accompanied by his wife and interpreter    Diagnosed with DM years ago  > 20 yrs ago . Islet cell Ab negative 2021 Currently checking blood sugars multiple  x / day,  through freestyle libre   Hypoglycemia episodes : yes                Symptoms: shaking                  Frequency: 1/ week Hemoglobin A1c has ranged from 5.2% in 2022, peaking at 8.1% in 2021.   In terms of diet, the patient eats 2 meals a day. Snacks occasionally     Nephrology : Dr. Moshe Cipro . Pt on HD  Monday, Wednesday and Friday  Pulmonary : Dr. Silas Flood  10/2021 for dyspnea     SUBJECTIVE:   During the last visit (05/01/2022): A1c 5.2%       Today (06/04/22): Kenneth [name] is here for follow-up on diabetes management.  He is accompanied by his wife . Interpreter line was used .  He  checks his blood sugars multiple times daily, through CGM. The patient has  had hypoglycemic episodes since the last clinic visit, which typically occur 3 x /month - most often occuring after a bolus  . The patient is symptomatic with these episodes.   Patient has been to the ED multiple times since his last visit here to include acute respiratory failure in June Fatigue and anemia as well as complications of fistula in July He also presented 2 days ago with thrombosis of fistula   He was seen for pretransplant evaluation at [facility] kidney transplant, Gaspar Cola    This  patient with type 2 diabetes is treated with Medtronic  (insulin pump). During the visit the pump basal and bolus doses were reviewed including carb/insulin rations and supplemental doses. The clinical list was updated. The glucose meter download was reviewed in detail to determine if the current pump settings are providing the best glycemic control without excessive hypoglycemia.  Pump and meter download:    Pump   Medtronic 630 G Settings   Insulin type   Lyumjev   Basal rate       0000 0.425  u/h    0600 0.475 u/h    1800 0.525 u/h       I:C ratio       0000 1:12                  Sensitivity       0000  55      Goal       0000  110            Type & Model of Pump: medtronic  Insulin Type: Currently using novolog .   PUMP STATISTICS: Average BG: 151 -/+ 55 BG Readings: 1.9/ day Average Daily Carbs (g): 67+/-55 Average  Total Daily Insulin: 20.4 +/- 5.6 Average Daily Basal: 11.4(56 %) Average Daily Bolus: 9.0 (44 %)     CONTINUOUS GLUCOSE MONITORING RECORD INTERPRETATION    Dates of Recording: 9/8-9/21/2023  Sensor description:freestyle libre   Results statistics:   CGM use % of time 47  Average and SD 269/42.4  Time in range  25%  % Time Above 180 24  % Time above 250 51  % Time Below target 0    Glycemic patterns summary:Hyperglycemia during the day and night   Hyperglycemic episodes  postprandial   Hypoglycemic episodes occurred n/a  Overnight periods: high    HOME DIABETES REGIMEN: Lyumjev      Statin: yes  ACE-I/ARB: no      DIABETIC COMPLICATIONS: Microvascular complications:  ESRD on HD , neuropathy Denies: retinopathy  Last eye exam: Completed   Macrovascular complications:  CAD ( S/P CABG 2017) Denies: PVD, CVA   PAST HISTORY: Past Medical History:  Past Medical History:  Diagnosis Date   CAD (coronary artery disease) 10/30/2016   NSTEMI 9/17 with CABG x 3 (LIMA-LAD, SVG-OM, SVG-RCA).   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   type 1 DM   ESRD on hemodialysis ([place])    "MWF; [place]" (03/30/2018)   Gastroesophageal reflux disease 08/18/2016   Hyperlipidemia    Hypertension    Ischemic rest pain of lower extremity 02/06/2015   Keratoma 02/27/2015   Metatarsal deformity 02/27/2015   PAF (paroxysmal atrial fibrillation) ([facility])    Pronation deformity of both feet 02/27/2015   Past Surgical History:  Past Surgical History:  Procedure Laterality Date   A/V FISTULAGRAM Left 03/06/2021   Procedure: A/V FISTULAGRAM;  Surgeon: Marty Heck, MD;  Location: [place] [facility];  Service: Cardiovascular;  Laterality: Left;   A/V FISTULAGRAM Left 06/02/2022   Procedure: A/V Fistulagram;  Surgeon: Serafina Mitchell, MD;  Location: [place] [facility];  Service: Cardiovascular;  Laterality: Left;   AV FISTULA PLACEMENT Left 06/22/2016   Procedure: ARTERIOVENOUS (AV) FISTULA CREATION LEFT UPPER ARM;  Surgeon: Rosetta Posner, MD;  Location: [place];  Service: Vascular;  Laterality: Left;   BIOPSY  12/19/2020   Procedure: BIOPSY;  Surgeon: Ladene Artist, MD;  Location: [place];  Service: Endoscopy;;   CARDIAC CATHETERIZATION N/A 05/31/2016   Procedure: Left Heart Cath and Coronary Angiography;  Surgeon: Jettie Booze, MD;  Location: [place] [facility];  Service: Cardiovascular;  Laterality: N/A;   CARDIAC CATHETERIZATION N/A 05/31/2016   Procedure: Right Heart Cath;  Surgeon: Jettie Booze, MD;  Location: [place] [facility];  Service: Cardiovascular;  Laterality: N/A;   CARDIAC CATHETERIZATION N/A 05/31/2016   Procedure: IABP Insertion;  Surgeon: Jettie Booze, MD;  Location: [place] [facility];  Service: Cardiovascular;  Laterality: N/A;   COLONOSCOPY WITH PROPOFOL N/A 12/20/2020   Procedure: COLONOSCOPY WITH PROPOFOL;  Surgeon: Mauri Pole, MD;  Location: [place] [facility];  Service: Endoscopy;  Laterality: N/A;   CORONARY ARTERY BYPASS GRAFT N/A 06/05/2016   Procedure: CORONARY ARTERY BYPASS GRAFTING (CABG) x3 LIMA to LAD -SVG to OM -SVG to RCA;  Surgeon: Ivin Poot, MD;  Location: [place];  Service: Open Heart Surgery;  Laterality: N/A;   CORONARY STENT INTERVENTION N/A 07/07/2017   Procedure: CORONARY STENT INTERVENTION;  Surgeon: Wellington Hampshire, MD;  Location: [place] [facility];  Service: Cardiovascular;  Laterality: N/A;   CORONARY STENT INTERVENTION N/A 03/30/2018   Procedure: CORONARY STENT INTERVENTION;  Surgeon: Martinique, Peter M, MD;  Location: [place] [facility];  Service: Cardiovascular;  Laterality: N/A;   ESOPHAGOGASTRODUODENOSCOPY (EGD) WITH PROPOFOL N/A 12/19/2020   Procedure: ESOPHAGOGASTRODUODENOSCOPY (EGD) WITH PROPOFOL;  Surgeon: Ladene Artist, MD;  Location[facility];  Service: Endoscopy;  Laterality: N/A;   EXCHANGE OF A DIALYSIS CATHETER Right 04/16/2022   Procedure: EXCHANGE OF A DIALYSIS CATHETER;  Surgeon: Cherre Robins, MD;  Location: [place];  Service: Vascular;  Laterality: Right;   GIVENS CAPSULE STUDY N/A 12/20/2020   Procedure: GIVENS CAPSULE STUDY;  Surgeon: Mauri Pole, MD;  Location: [place] [facility];  Service: Endoscopy;  Laterality: N/A;   INSERTION OF DIALYSIS CATHETER N/A 06/05/2016   Procedure: INSERTION OF DIALYSIS/trialysis CATHETER;  Surgeon: Ivin Poot, MD;  Location: [place];  Service: Vascular;  Laterality: N/A;   INSERTION OF DIALYSIS CATHETER Right 06/13/2016   Procedure: INSERTION OF DIALYSIS CATHETER RIGHT INTERNAL JUGULAR;  Surgeon: Conrad Hertford, MD;  Location: [place];  Service: Vascular;  Laterality: Right;   INSERTION OF DIALYSIS CATHETER Right 09/07/2019   Procedure: Insertion Of Dialysis Catheter;  Surgeon: Rosetta Posner, MD;  Location: [facility] OR;  Service: Vascular;  Laterality: Right;   INSERTION OF DIALYSIS CATHETER N/A 04/09/2022   Procedure: INSERTION OF TUNNELED  DIALYSIS CATHETER;  Surgeon: Cherre Robins, MD;  Location: [place];  Service: Vascular;  Laterality: N/A;   INTRAOPERATIVE TRANSESOPHAGEAL ECHOCARDIOGRAM N/A 06/05/2016   Procedure: INTRAOPERATIVE TRANSESOPHAGEAL ECHOCARDIOGRAM;  Surgeon: Ivin Poot, MD;  Location: [place];  Service: Open Heart Surgery;  Laterality: N/A;   LEFT HEART CATH AND CORS/GRAFTS ANGIOGRAPHY N/A 11/03/2016   Procedure: Left Heart Cath and Cors/Grafts Angiography;  Surgeon: Troy Sine, MD;  Location: [place] [facility];  Service: Cardiovascular;  Laterality: N/A;   LEFT HEART CATH AND CORS/GRAFTS ANGIOGRAPHY N/A 07/07/2017   Procedure: LEFT HEART CATH AND CORS/GRAFTS ANGIOGRAPHY;  Surgeon: Larey Dresser, MD;  Location: [place] [facility];  Service: Cardiovascular;  Laterality: N/A;   LEFT HEART CATH AND CORS/GRAFTS ANGIOGRAPHY N/A 03/30/2018   Procedure: LEFT HEART CATH AND CORS/GRAFTS ANGIOGRAPHY;  Surgeon: Martinique, Peter M, MD;  Location: [place] [facility];  Service: Cardiovascular;  Laterality: N/A;   LEFT HEART CATH AND CORS/GRAFTS ANGIOGRAPHY N/A 04/17/2019   Procedure: LEFT HEART CATH AND CORS/GRAFTS ANGIOGRAPHY;  Surgeon: Larey Dresser, MD;  Location: [place] [facility];  Service: Cardiovascular;  Laterality: N/A;   PERIPHERAL VASCULAR BALLOON ANGIOPLASTY Left 06/02/2022   Procedure: PERIPHERAL VASCULAR BALLOON ANGIOPLASTY;  Surgeon: Serafina Mitchell, MD;  Location: [place] [facility];  Service: Cardiovascular;  Laterality: Left;   PERIPHERAL VASCULAR INTERVENTION Left 03/06/2021   Procedure: PERIPHERAL VASCULAR INTERVENTION;  Surgeon: Marty Heck, MD;  Location: [place] [facility];  Service: Cardiovascular;  Laterality: Left;   POLYPECTOMY  12/20/2020   Procedure: POLYPECTOMY;  Surgeon: Mauri Pole, MD;  Location: [place] [facility];  Service: Endoscopy;;   REVISION OF ARTERIOVENOUS GORETEX GRAFT Left 30/16/0109   Procedure: PLICATION OF ANEURYSM OF ARTERIOVENOUS FISTULA  LEFT ARM;  Surgeon: Rosetta Posner, MD;  [facility] OR;  Service: Vascular;  Laterality: Left;   REVISON OF ARTERIOVENOUS FISTULA Left 09/07/2019   Procedure: REVISON OF LEFT UPPER ARM ARTERIOVENOUS FISTULA;  Surgeon: Rosetta Posner, MD;  Location: [facility]  OR;  Service: Vascular;  Laterality: Left;   REVISON OF ARTERIOVENOUS FISTULA Left 04/09/2022   Procedure: LEFT ARM FISTULA REVISION;  Surgeon: Cherre Robins, MD;  Location: [place];  Service: Vascular;  Laterality: Left;  PERIPHERAL NERVE BLOCK   REVISON OF ARTERIOVENOUS FISTULA Left 04/16/2022   Procedure: REVISON OF ARTERIOVENOUS FISTULA;  Surgeon: Cherre Robins, MD;  Location: [place];  Service: Vascular;  Laterality: Left;   RIGHT/LEFT HEART CATH AND CORONARY ANGIOGRAPHY N/A 01/03/2021   Procedure: RIGHT/LEFT HEART CATH AND CORONARY ANGIOGRAPHY;  Surgeon: Larey Dresser, MD;  Location: [place] [facility];  Service: Cardiovascular;  Laterality: N/A;    Social History:  reports that he quit smoking about 9 years ago. His smoking use included cigarettes. He has a 52.50 pack-year smoking history. He has never been exposed to tobacco smoke. He has never used smokeless tobacco. He reports that he does not drink alcohol and does not use drugs. Family History:  Family History  Problem Relation Age of Onset   CAD Father    Colon cancer Father    Diabetes Brother      HOME MEDICATIONS: Allergies as of 06/04/2022   No Known Allergies      Medication List        Accurate as of June 04, 2022 10:54 AM. If you have any questions, ask your nurse or doctor.          ARANESP (ALBUMIN FREE) IJ Darbepoetin Alfa (Aranesp)   atorvastatin 80 MG tablet Commonly known as: LIPITOR Take 1 tablet (80 mg total) by mouth daily.   calcium acetate 667 MG capsule Commonly known as: PHOSLO Take 667 mg by mouth 3 (three) times daily with meals.   carvedilol 6.25 MG tablet Commonly known as: COREG Take 1 tablet (6.25 mg total) by mouth 2 (two) times daily with a meal. Hold  on the mornings of dialysis.   diclofenac Sodium 1 % Gel Commonly known as: Voltaren Apply 4 grams topically 4 (four) times daily.   Eliquis 5 MG Tabs tablet Generic drug: apixaban Take 1 tablet (5 mg total) by mouth 2 (two) times daily.   ezetimibe 10 MG tablet Commonly known as: ZETIA Take 1 tablet by mouth daily   FreeStyle Libre 2 Sensor Misc 1 Device by Does not apply [name] every 14 (fourteen) days.   heparin 1000 unit/mL Soln injection Heparin Sodium (Porcine) 1,000 Units/mL Catheter Lock Venous   hydrALAZINE 100 MG tablet Commonly known as: APRESOLINE Take 1 tablet (100 mg total) by mouth 3 (three) times daily. DO NOT TAKE AM AND NOON DOSES ON HD DAYS   isosorbide mononitrate 60 MG 24 hr tablet Commonly known as: IMDUR Take 1&1/2 tablets (90 mg total) by mouth daily. Hold on days of dialysis on Mon, Wed, Fri.   levETIRAcetam 500 MG tablet Commonly known as: KEPPRA Take 1 tablet (500 mg total) by mouth at bedtime.   levETIRAcetam 250 MG tablet Commonly known as: KEPPRA Take 1 tablet (250 mg total) by mouth every Monday, Wednesday, and Friday at 8 PM.   LOKELMA PO Take 1 packet by mouth 4 (four) times a week. Hold on dialysis days   Lyumjev 100 UNIT/ML Soln Generic drug: Insulin Lispro-aabc Max Daily 30 units per insulin pump   megestrol 625 MG/5ML suspension Commonly known as: Megace ES Take 5 mLs (625 mg total) by mouth daily.   MIRCERA IJ Inject as directed.   OneTouch Verio test strip Generic drug: glucose blood 1 each  by Other [name] in the morning, at noon, in the evening, and at bedtime. Use as instructed   pantoprazole 40 MG tablet Commonly known as: PROTONIX Take 1 tablet (40 mg total) by mouth 2 (two) times daily.   sertraline 50 MG tablet Commonly known as: Zoloft Take 1 tablet (50 mg total) by mouth daily.   Stiolto Respimat 2.5-2.5 MCG/ACT Aers Generic drug: Tiotropium Bromide-Olodaterol Inhale 2 puffs by mouth into the lungs daily.    TechLite Pen Needles 32G X 6 MM Misc Generic drug: Insulin Pen Needle Use as directed 3 times daily         ALLERGIES: No Known Allergies   REVIEW OF SYSTEMS: A comprehensive ROS was conducted with the patient and is negative except as per HPI     OBJECTIVE:   VITAL SIGNS: BP 134/70 (BP Location: Left Arm, Patient Position: Sitting, Cuff Size: Small)   Pulse 67   Ht 5\' 5"  (1.651 m)   Wt 134 lb (60.8 kg)   SpO2 93%   BMI 22.30 kg/m    PHYSICAL EXAM:  General: Pt appears well and is in NAD  Lungs: Clear with good BS bilat   Heart: RRR   Extremities:  Lower extremities - No pretibial edema.  Neuro: MS is good with appropriate affect, pt is alert and Ox3    DM foot exam: 10/28/2021  The skin of the feet is intact without sores or ulcerations. The pedal pulses are 2+ on right and 2+ on left. The sensation is absent to a screening 5.07, 10 gram monofilament bilaterally    DATA REVIEWED:  Lab Results  Component Value Date   HGBA1C 9.0 (A) 06/04/2022   HGBA1C 9.0 (A) 01/29/2022   HGBA1C 7.1 (A) 10/28/2021   Lab Results  Component Value Date   LDLCALC 48 07/10/2021   CREATININE 6.50 (H) 06/02/2022   No results found for: "MICRALBCREAT"  Lab Results  Component Value Date   CHOL 100 07/10/2021   HDL 46 07/10/2021   LDLCALC 48 07/10/2021   TRIG 31 07/10/2021   CHOLHDL 2.2 07/10/2021        ASSESSMENT / PLAN / RECOMMENDATIONS:   1) Type 2 Diabetes Mellitus, with improving glucose control , With Neuropathic, ESRD on HD and macrovascular  complications - Most recent A1c of 9.0 %. Goal A1c < 7.0 %.     - A1c skewed due to ESRD  -His average BG's on the pump download are 151 mg/DL, but it is because most of his BG entries are manual -CGM download is most accurate and reflecting his glycemic control which she has been averaging 269 mg/DL -The patient continues not to enter carbohydrates with each meal, as well as dietary indiscretions -I have again advised  the patient to enter carbohydrates with each meal, I have asked the spouse to remind him if possible -Patient tells me that he holds off on the insulin on the days of dialysis, looking at his CGM download he has not been noted with hypoglycemia during these days and BG's are tight.  The main reason for hyperglycemia is postprandial and entering CHO intake prior to each meal will help resolve this issue -I am going to adjust his basal rate for the  times as below -Barriers to diabetes self-care is language -I have attempted to have him see our CDE for tandem pump but there has been communication issues, and I am going to attempt to prescribe the Medtronic Guardian sensor and transmitter to see how  he will do with it, if this is not going to work well I will pursue tandem pump again   - MEDICATIONS:     Pump   Medtronic 630 G Settings   Insulin type   Lyumjev   Basal rate       0000 0.475  u/h    0600 0.475 u/h    1800 0.575 u/h       I:C ratio       0000 1:12                  Sensitivity       0000  55      Goal       0000  110         EDUCATION / INSTRUCTIONS: BG monitoring instructions: Patient is instructed to check his blood sugars 3 times a day, before meals . Call [facility] if: BG persistently < 70  I reviewed the Rule of 15 for the treatment of hypoglycemia in detail with the patient. Literature supplied.   2) Diabetic complications:  Eye: Does not have known diabetic retinopathy.  Neuro/ Feet: Does  have known diabetic peripheral neuropathy. Renal: Patient does  have known baseline CKD. He is  on an ACEI/ARB at present.  Follow-up in 6 months  Signed electronically by: Mack Guise, MD  [facility]  [facility] [address] [place], [place] [address] Phone: [phone] FAX: [phone]   CC: Shelda Pal, [address] [place] [address] Phone:  [phone]  Fax: [phone]    Return to Endocrinology clinic as below: Future Appointments  Date Time Provider [place]  06/09/2022  1:15 PM Gardiner Barefoot, DPM [facility]  07/02/2022 10:30 AM Cameron Sprang, MD [facility] None  10/06/2022 11:00 AM Shelda Pal, DO [facility]

## 2022-06-05 ENCOUNTER — Encounter: Payer: Self-pay | Admitting: Internal Medicine

## 2022-06-05 ENCOUNTER — Telehealth: Payer: Self-pay | Admitting: Dietician

## 2022-06-05 MED ORDER — GUARDIAN LINK 3 TRANSMITTER MISC: 1.0000 | 3 refills | Status: AC

## 2022-06-05 MED ORDER — GUARDIAN SENSOR 3 MISC: 1.0000 | 3 refills | Status: AC

## 2022-06-05 NOTE — Telephone Encounter (Signed)
I spoke with the Tandem rep.  He can not tell if supplies were sent to the patient. He said to have patient contact the help line and they can see to it that supplies are sent to the patient.   I called the patient and gave them the Tandem help line number to contact them about the supplies.  Pt. Reported to me that she is not certain if she has the supplies.  Told her to open the box to see if infusion sets and cartridges are inside the box.  If not, she needs to call the help line for these. She reported good understanding of this and repeated the number back to me correctly.

## 2022-06-05 NOTE — Telephone Encounter (Signed)
Called patient due to request of my supervisor. Chart reviewed.   Concerns about pump and need to ask about what pump supplies that he has to determine training needs.  Patient was not available. Used Language Line [place] who left a message for him to call our office.  Antonieta Iba, RD, LDN, CDCES

## 2022-06-09 ENCOUNTER — Ambulatory Visit (INDEPENDENT_AMBULATORY_CARE_PROVIDER_SITE_OTHER): Payer: Medicare Other | Admitting: Podiatry

## 2022-06-09 DIAGNOSIS — M79675 Pain in left toe(s): Secondary | ICD-10-CM

## 2022-06-09 DIAGNOSIS — B353 Tinea pedis: Secondary | ICD-10-CM

## 2022-06-09 DIAGNOSIS — M2042 Other hammer toe(s) (acquired), left foot: Secondary | ICD-10-CM

## 2022-06-09 DIAGNOSIS — M2041 Other hammer toe(s) (acquired), right foot: Secondary | ICD-10-CM

## 2022-06-09 DIAGNOSIS — E1140 Type 2 diabetes mellitus with diabetic neuropathy, unspecified: Secondary | ICD-10-CM | POA: Diagnosis not present

## 2022-06-09 DIAGNOSIS — B351 Tinea unguium: Secondary | ICD-10-CM | POA: Diagnosis not present

## 2022-06-09 DIAGNOSIS — M79674 Pain in right toe(s): Secondary | ICD-10-CM | POA: Diagnosis not present

## 2022-06-09 DIAGNOSIS — E1149 Type 2 diabetes mellitus with other diabetic neurological complication: Secondary | ICD-10-CM

## 2022-06-09 DIAGNOSIS — L84 Corns and callosities: Secondary | ICD-10-CM

## 2022-06-09 NOTE — Progress Notes (Signed)
This patient presents to the office with chief complaint of long thick nails and diabetic feet.  He says he has developed a draining skin lesion second toe right foot.  No drainage has been present for a month.  He also says he develops an occasional blister on the inside of his left heel.  No skin lesion  present today.  He also has painful callus under the outside balls of both feet.  This patient says there are long thick painful nails.  These nails are painful walking and wearing shoes.   Patient is unable to  self treat his own nails  and callus. This patient presents  to the office today for treatment of the  long nails, callus  and a foot evaluation due to history of  diabetes.  General Appearance  Alert, conversant and in no acute stress.  Vascular  Dorsalis pedis and posterior tibial  pulses are palpable  bilaterally.  Capillary return is within normal limits  bilaterally. Temperature is within normal limits  bilaterally.  Neurologic  Senn-Weinstein monofilament wire test absent  bilaterally. Muscle power within normal limits bilaterally.  Nails Thick disfigured discolored nails with subungual debris  from hallux to fifth toes bilaterally. No evidence of bacterial infection or drainage bilaterally.  Orthopedic  No limitations of motion of motion feet .  No crepitus or effusions noted.  HAV  B/L.  Hammer toes  with DJD PIPJ 2-4  B/L. Plantar flexed fifth met heads  B/L.  Skin  normotropic skin with no porokeratosis noted bilaterally.  No signs of infections or ulcers noted.     Onychomycosis    Callus sub 5th met  B/L.  Tinea pedis.   Debride nails x 10. With nail nipper.  Debride callus with # 15 blade and dremel tool.  Prescribe Lamisil AF  To consider diabetic shoes next visit.  Padding dispensed fifth met heads  B/l.   RTC 3 months.   Gardiner Barefoot DPM

## 2022-06-10 NOTE — Telephone Encounter (Signed)
I have contacted the Tandem trainer to give her a call to set up training

## 2022-06-12 ENCOUNTER — Other Ambulatory Visit (HOSPITAL_BASED_OUTPATIENT_CLINIC_OR_DEPARTMENT_OTHER): Payer: Self-pay

## 2022-06-12 ENCOUNTER — Ambulatory Visit (INDEPENDENT_AMBULATORY_CARE_PROVIDER_SITE_OTHER): Payer: Medicare Other | Admitting: Neurology

## 2022-06-12 DIAGNOSIS — G40909 Epilepsy, unspecified, not intractable, without status epilepticus: Secondary | ICD-10-CM | POA: Diagnosis not present

## 2022-06-12 DIAGNOSIS — R404 Transient alteration of awareness: Secondary | ICD-10-CM

## 2022-06-15 ENCOUNTER — Other Ambulatory Visit (HOSPITAL_BASED_OUTPATIENT_CLINIC_OR_DEPARTMENT_OTHER): Payer: Self-pay

## 2022-06-19 ENCOUNTER — Other Ambulatory Visit (HOSPITAL_BASED_OUTPATIENT_CLINIC_OR_DEPARTMENT_OTHER): Payer: Self-pay

## 2022-06-23 ENCOUNTER — Other Ambulatory Visit (HOSPITAL_BASED_OUTPATIENT_CLINIC_OR_DEPARTMENT_OTHER): Payer: Self-pay

## 2022-06-23 MED ORDER — FLUAD QUADRIVALENT 0.5 ML IM PRSY
PREFILLED_SYRINGE | INTRAMUSCULAR | 0 refills | Status: DC
  Filled 2022-06-23: qty 0.5, 1d supply, fill #0

## 2022-07-01 NOTE — Procedures (Signed)
Patient's Name: Rondale Nies MRN: [id number] Date of Birth: [birth year]   Ordering Provider: Ellouise Newer, MD DX CODE(s): R56.9 EXAM DURATION: 47 hours   CLINICAL HISTORY: This is a 66 year old man with episodes of staring and unresponsiveness.   MEDICATION(s): Levetiracetam, Sertraline, Carvedilol, Ezetimide, Aranesp, Eliquis, Hydralazine, Atorvastatin    TECHNICAL DESCRIPTION: Long-Term EEG with Video was monitored intermittently by a qualified EEG technologist for the entirety of the recording; quality check-ins were performed at a minimum of every two hours, checking, and documenting real-time data and video to assure the integrity and quality of the recording (e.g., camera position, electrode integrity and impedance), and identify the need for maintenance. For intermittent monitoring, an EEG Technologist monitored no more than 12 patients concurrently. Video was being recorded at least 80% of the time during the study duration, unless otherwise noted in an Exception Statement.   At the end of the recording, the EEG Technologist generates a technical description, which is the EEG Technologist's written documentation of the reviewed video-EEG data, including technical interventions and these elements: reviewing raw EEG/VEEG data and events and automated detection as well as patient pushbutton event activations; and annotating, editing, and archiving EEG/VEEG data for review by the physician or other qualified healthcare professional. For review, the Video EEG recording can be visualized in all standard types of montages, 16 channels and greater, and playbacks include digital high frequency filters previously noted. The Video EEG has been notated with patient typical symptom events at the direction of the patient by depressing a push button mounted on a waist worn Lifelines EEG recording device. Digital spike and seizure detection software was used to identify potential abnormalities in the EEG, and  alerts were reviewed and annotated by the technologist in the Stratus EEG Review software. Video EEG and report are notated with events that were determined to be of significance by the digital analysis software showing spike and seizure detections.   SET-UP TECH: Jerene Pitch   RECORDING SET-UP DATE: 06/12/2022 3:29PM RECORDING TAKE-DOWN DATE: 06/14/2022 2:49PM   SPIKE AND SEIZURE ANALYSIS AND REVIEW: Spike and seizure detection software alerts have been reviewed by a Production designer, theatre/television/film. None of these alerts appear to have clinical significance.  PUSH BUTTON EVENTS: A patient diary was maintained. A patient diary was maintained, patient did not press button or report typical symptoms. 2 button presses were accidental or monitoring tech driven (Resets)    DESCRIPTION OF RECORDING: During maximal wakefulness, the background activity consisted of a symmetric 10 Hz posterior dominant rhythm that was reactive to eye opening and eye closure. The background is symmetric. There were no epileptiform discharges or electrographic seizures seen in wakefulness.   During the recording, the patient progresses through wakefulness, drowsiness, and sleep. Vertex waves and sleep spindles were seen. Again there were no epileptiform discharges seen.   EKG lead was unremarkable.   PUSH BUTTON EVENTS: There were no push button events.   IMPRESSION: This 47-hour ambulatory video EEG study is normal.    CLINICAL CORRELATION: A normal EEG does not rule out a clinical diagnosis of epilepsy. Typical events were not captured. If further clinical questions remain, inpatient video EEG monitoring may be helpful.

## 2022-07-02 ENCOUNTER — Ambulatory Visit (INDEPENDENT_AMBULATORY_CARE_PROVIDER_SITE_OTHER): Payer: Medicare Other | Admitting: Neurology

## 2022-07-02 ENCOUNTER — Encounter: Payer: Self-pay | Admitting: Neurology

## 2022-07-02 VITALS — BP 153/64 | HR 61 | Ht 65.0 in | Wt 133.0 lb

## 2022-07-02 DIAGNOSIS — R404 Transient alteration of awareness: Secondary | ICD-10-CM

## 2022-07-02 NOTE — Patient Instructions (Signed)
Good to see you doing well.   We can stop the Levetiracetam (Keppra) and see how you do off medication.Follow-up in 6 months, call for any changes.   Seizure Precautions: 1. If medication has been prescribed for you to prevent seizures, take it exactly as directed.  Do not stop taking the medicine without talking to your doctor first, even if you have not had a seizure in a long time.   2. Avoid activities in which a seizure would cause danger to yourself or to others.  Don't operate dangerous machinery, swim alone, or climb in high or dangerous places, such as on ladders, roofs, or girders.  Do not drive unless your doctor says you may.  3. If you have any warning that you may have a seizure, lay down in a safe place where you can't hurt yourself.    4.  No driving for 6 months from last seizure, as per [facility].   Please refer to the following link on the [place] website for more information: http://[email or web address].org/[email or web address]/driving/drivingu.cfm   5.  Maintain good sleep hygiene. Avoid alcohol.  6.  Contact your doctor if you have any problems that may be related to the medicine you are taking.  76.  Call 911 and bring the patient back to the ED if:        A.  The seizure lasts longer than 5 minutes.       B.  The patient doesn't awaken shortly after the seizure  C.  The patient has new problems such as difficulty seeing, speaking or moving  D.  The patient was injured during the seizure  E.  The patient has a temperature over 102 F (39C)  F.  The patient vomited and now is having trouble breathing

## 2022-07-02 NOTE — Progress Notes (Signed)
NEUROLOGY FOLLOW UP OFFICE NOTE  Kenneth [name] [id number] 07/15/1956  HISTORY OF PRESENT ILLNESS: I had the pleasure of seeing Kenneth [name] in follow-up in the neurology clinic on 07/02/2022.  The patient was last seen 3 months ago for seizures. He is again accompanied by his wife who helps supplement the history today. A [place] medical interpreter, Sunmi, helps with translation.  Records and images were personally reviewed where available. On his last visit, his wife reported recurrent staring episodes. He had a 47-hour ambulatory EEG 2 weeks ago which was normal. Staring episodes were not reported. He is on Levetiracetam 500mg  qhs, taking additional 250mg  after HD. He would like to stop the Levetiracetam. We discussed that this was started in June 2023 in the hospital due to episodes of unresponsiveness and his wife reporting staring spells. They deny any further episodes of unresponsiveness/altered mental status since June 2023. No focal numbness/tingling/weakness, myoclonic jerks. Sometimes when sitting or lying down, his legs would bump together, but he is able to control them. No headaches, dizziness, vision changes, no falls.    History on Initial Assessment 03/26/2022: This is a 66 year old right-handed man with a history of hypertension, hyperlipidemia, DM, CAD s/p CABG, ESRD on HD, paroxysmal atrial fibrillation, toxoplama uveitis, presenting for evaluation of seizures. He has been evaluated by inpatient Neurology several times since 2019. He was admitted to [facility] for a cardiac catheterization and stents were placed in 03/2018. While doing physical therapy, he was noted to have left sided-weakness and intermittent decreased level of consciousness. Exam noted a lot of inattention. MRI brain did not show any acute changes, there was mild chronic microvascular disease, small chronic left frontal cortex infarct. 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 He was found to have atrial fibrillation and started on Eliquis. In 08/2019, he was brought to the ER after he had chest pain then suddenly became unresponsive. On initial exam, he was initially unresponsive and smelling salts were put under his nose. He began to cough and sit up 30 seconds later, eyes were opened but he would not answer. Passive arm elevation and drop noted it would fall away from his face. He clenched his eyes tight with attempts to open. MRI brain no acute changes, EEG normal. There was concern for embellishment/factitious disorder, but when he was back to baseline the next day and reported feeling dizzy after taking Baclofen, it was favored that he had Baclofen toxicity in the setting of ESRD. He was evaluated in 07/2021 for lethargy and severe hyperkalemia (K > 7.5) with EKG changes and report of seizure witnessed by ER staff when transferring from stretcher to hospital bed ("patient became unresponsive, gurgling respirations, appeared to have seizure-like activity). He reported a 41-month history of daily posterior headaches and vision changes, his wife reported that for the past 6 months, he would stare off into the distance with jaw opening and closing with subsequent unresponsive state for less than a minute followed by confusion. His EEG was limited by movement artifact but within normal limits. He was started on Levetiracetam but it appears he did not continue this on hospital discharge. He was admitted twice to [facility] in June for shortness of breath. During the second admission on 6/13, he was very lethargic and wife reported he was altered. Since he was not taking the LEV, it was started at 500mg  qhs with additional 250mg  after HD.   His wife reports the  staring episodes have occurred since he got sick for several years now. She is not sure if they are seizures, she would get his attention and starts answering a little later. She states he may just be daydreaming or tired. She  notices it on a daily basis and just leaves him alone. There is no associated tongue bite, incontinence, or jerking/convulsive activity.  He keeps his eyes closed when not stimulated, but opens eyes and answers appropriately otherwise. He says he feels tired. He denies any headaches. He fell this morning and reported dizziness en route to the clinic, his wife notes his sugar dropped to 61. He states he only feels dizzy when hypoglycemic. He denies any olfactory/gustatory hallucinations, rising epigastric sensation, focal numbness/tingling/weakness, myoclonic jerks. He denies any diplopia, dysarthria/dysphagia, neck/back pain. He has numbness and tingling in both feet from neuropathy. He has a rollator usually when he is outside and has a cane today, but does not use any assistive devices at home. His wife reports he has HD three times a week so he sleeps during the day and is awake at night. He had a normal birth and early development.  There is no history of febrile convulsions, CNS infections such as meningitis/encephalitis, significant traumatic brain injury, neurosurgical procedures, or family history of seizures.   PAST MEDICAL HISTORY: Past Medical History:  Diagnosis Date   CAD (coronary artery disease) 10/30/2016   NSTEMI 9/17 with CABG x 3 (LIMA-LAD, SVG-OM, SVG-RCA).   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 60 tablet 11   atorvastatin (LIPITOR) 80 MG tablet Take 1 tablet (80 mg total) by mouth daily. 90 tablet 3   calcium acetate (PHOSLO) 667 MG capsule Take 667 mg by mouth 3 (three) times daily with meals.      carvedilol (COREG) 6.25 MG tablet Take 1 tablet (6.25 mg total) by mouth 2 (two) times daily with a meal. Hold on the mornings of dialysis. 60 tablet 3   Continuous Blood Gluc Sensor (FREESTYLE LIBRE 2 SENSOR) MISC 1 Device by Does not apply route every 14 (fourteen) days. 6 each 3   Continuous Blood Gluc Sensor (GUARDIAN SENSOR 3) MISC 1 Device by Does not apply route as directed. 9 each 3   Continuous Blood Gluc Transmit (GUARDIAN LINK 3 TRANSMITTER) MISC 1 Device by Does not apply route as directed. 1 each 3   Darbepoetin Alfa (ARANESP, ALBUMIN FREE, IJ) Darbepoetin Alfa (Aranesp)     diclofenac Sodium (VOLTAREN) 1 % GEL Apply 4 grams topically 4 (four) times daily. 100 g 2   ezetimibe (ZETIA) 10 MG tablet Take 1 tablet by mouth daily 90 tablet 3   glucose blood (ONETOUCH VERIO) test strip 1 each by Other route in the  morning, at noon, in the evening, and at bedtime. Use as instructed 400 each 2   heparin 1000 unit/mL SOLN injection Heparin Sodium (Porcine) 1,000 Units/mL Catheter Lock Venous     hydrALAZINE (APRESOLINE) 100 MG tablet Take 1 tablet (100 mg total) by mouth 3 (three) times daily. DO NOT TAKE AM AND NOON DOSES ON HD DAYS 270 tablet 2   influenza vaccine adjuvanted (FLUAD QUADRIVALENT) 0.5 ML injection Inject into the muscle. 0.5 mL 0   Insulin Lispro-aabc (LYUMJEV) 100 UNIT/ML SOLN Max Daily 30 units per insulin pump 30 mL 6   Insulin Pen Needle (PEN NEEDLES) 32G X 6 MM MISC Use as directed 3 times daily 300 each 1   isosorbide mononitrate (IMDUR) 60 MG 24 hr tablet Take 1&1/2 tablets (90 mg total) by mouth daily. Hold on days of dialysis on Mon, Wed, Fri. 90 tablet 5    levETIRAcetam (KEPPRA) 250 MG tablet Take 1 tablet (250 mg total) by mouth every Monday, Wednesday, and Friday at 8 PM. 12 tablet 0   levETIRAcetam (KEPPRA) 500 MG tablet Take 1 tablet (500 mg total) by mouth at bedtime. 30 tablet 5   megestrol (MEGACE ES) 625 MG/5ML suspension Take 5 mLs (625 mg total) by mouth daily. 150 mL 1   Methoxy PEG-Epoetin Beta (MIRCERA IJ) Inject as directed.     pantoprazole (PROTONIX) 40 MG tablet Take 1 tablet (40 mg total) by mouth 2 (two) times daily. 180 tablet 1   sertraline (ZOLOFT) 50 MG tablet Take 1 tablet (50 mg total) by mouth daily. 90 tablet 2   Sodium Zirconium Cyclosilicate (LOKELMA PO) Take 1 packet by mouth 4 (four) times a week. Hold on dialysis days     Tiotropium Bromide-Olodaterol (STIOLTO RESPIMAT) 2.5-2.5 MCG/ACT AERS Inhale 2 puffs by mouth into the lungs daily. 4 g 11   No current facility-administered medications on file prior to visit.    ALLERGIES: No Known Allergies  FAMILY HISTORY: Family History  Problem Relation Age of Onset   CAD Father    Colon cancer Father    Diabetes Brother     SOCIAL HISTORY: Social History   Socioeconomic History   Marital status: Married    Spouse name: Not on file   Number of children: Not on file   Years of education: Not on file   Highest education level: Not on file  Occupational History   Not on file  Tobacco Use   Smoking status: Former    Packs/day: 1.50    Years: 35.00    Total pack years: 52.50    Types: Cigarettes    Quit date: 2014    Years since quitting: 9.8    Passive exposure: Never   Smokeless tobacco: Never   Tobacco comments:    "quit e-cigs ~ 2014"  Vaping Use   Vaping Use: Former  Substance and Sexual Activity   Alcohol use: Never    Alcohol/week: 0.0 standard drinks of alcohol   Drug use: Never   Sexual activity: Not on file  Other Topics Concern   Not on file  Social History Narrative   Right handed    Social Determinants of Health   Financial  Resource Strain: Not on file  Food Insecurity: Not on file  Transportation Needs: Not on file  Physical Activity: Not on file  Stress: Not on file  Social Connections: Not on file  Intimate Partner Violence: Not on file     PHYSICAL EXAM: Vitals:   07/02/22 1039  BP: (!) 153/64  Pulse: 61  SpO2: 97%   General: No acute distress Head:  Normocephalic/atraumatic Skin/Extremities: No rash, no edema Neurological Exam: alert and awake. No aphasia or dysarthria. Fund of knowledge is appropriate.  Attention and concentration are normal.   Cranial nerves: Pupils equal, round. Extraocular movements intact with no nystagmus. Visual fields full.  No facial asymmetry.  Motor: Bulk and tone normal, muscle strength 5/5 throughout with no pronator drift.   Finger to nose testing intact.  Gait narrow-based and steady, no ataxia.   IMPRESSION: This is a 66 yo RH man with a history of hypertension, hyperlipidemia, DM, CAD s/p CABG, ESRD on HD, paroxysmal atrial fibrillation, toxoplama uveitis, who has had admissions for altered mental status since 2019 and one witnessed episode in the ER last 07/2021 where he became unresponsive with gurgling respirations and appeared to have seizure-like activity in the setting of several metabolic derangements. Repeat brain MRI studies and routine EEGs, as well as recent prolonged 47-hour EEG were normal. Episodes appear to be more metabolic-related, he is adamant about stopping the Levetiracetam, we agreed to stop medication and monitor symptoms off medication. He does not drive. Follow-up in 6 months, call for any changes.    Thank you for allowing me to participate in his care.  Please do not hesitate to call for any questions or concerns.    Ellouise Newer, M.D.   CC: Dr. Nani Ravens

## 2022-07-10 ENCOUNTER — Other Ambulatory Visit: Payer: Self-pay | Admitting: Pulmonary Disease

## 2022-07-10 ENCOUNTER — Other Ambulatory Visit: Payer: Self-pay | Admitting: Family Medicine

## 2022-07-10 ENCOUNTER — Other Ambulatory Visit (HOSPITAL_BASED_OUTPATIENT_CLINIC_OR_DEPARTMENT_OTHER): Payer: Self-pay

## 2022-07-10 DIAGNOSIS — I10 Essential (primary) hypertension: Secondary | ICD-10-CM

## 2022-07-10 DIAGNOSIS — K219 Gastro-esophageal reflux disease without esophagitis: Secondary | ICD-10-CM

## 2022-07-10 MED ORDER — CARVEDILOL 6.25 MG PO TABS
6.2500 mg | ORAL_TABLET | Freq: Two times a day (BID) | ORAL | 3 refills | Status: DC
  Filled 2022-07-10: qty 60, 30d supply, fill #0

## 2022-07-13 ENCOUNTER — Other Ambulatory Visit (HOSPITAL_BASED_OUTPATIENT_CLINIC_OR_DEPARTMENT_OTHER): Payer: Self-pay

## 2022-07-13 MED ORDER — PANTOPRAZOLE SODIUM 40 MG PO TBEC
40.0000 mg | DELAYED_RELEASE_TABLET | Freq: Two times a day (BID) | ORAL | 1 refills | Status: DC
  Filled 2022-07-13: qty 180, 90d supply, fill #0
  Filled 2022-10-11: qty 180, 90d supply, fill #1

## 2022-07-16 ENCOUNTER — Other Ambulatory Visit (HOSPITAL_BASED_OUTPATIENT_CLINIC_OR_DEPARTMENT_OTHER): Payer: Self-pay

## 2022-07-16 ENCOUNTER — Telehealth: Payer: Self-pay | Admitting: Internal Medicine

## 2022-07-16 MED ORDER — LIDOCAINE 5 % EX PTCH
MEDICATED_PATCH | CUTANEOUS | 0 refills | Status: DC
  Filled 2022-07-16: qty 30, 30d supply, fill #0

## 2022-07-16 NOTE — Telephone Encounter (Signed)
Patient is calling to check on the status of the form for surgery clearance.

## 2022-07-16 NOTE — Telephone Encounter (Signed)
Pt's translator contacted and advised forms have  been completed and faxed to [facility]. Confirmation page attached. Did not mention surgery clearance form.

## 2022-07-17 ENCOUNTER — Other Ambulatory Visit (HOSPITAL_BASED_OUTPATIENT_CLINIC_OR_DEPARTMENT_OTHER): Payer: Self-pay

## 2022-07-17 NOTE — Telephone Encounter (Signed)
Diabetic educator contacted office after speaking with pt's wife about getting trained on the Tandem. Educator advised there was no current referral and wanted to follow up on where in the process was the pt in getting a new pump. Advised paperwork was sent for Guardian CGM but nothing had been sent for a pump.

## 2022-07-20 ENCOUNTER — Other Ambulatory Visit (HOSPITAL_BASED_OUTPATIENT_CLINIC_OR_DEPARTMENT_OTHER): Payer: Self-pay

## 2022-07-20 MED ORDER — TRAMADOL HCL 50 MG PO TABS
50.0000 mg | ORAL_TABLET | Freq: Four times a day (QID) | ORAL | 0 refills | Status: DC | PRN
  Filled 2022-07-20: qty 40, 5d supply, fill #0

## 2022-07-21 ENCOUNTER — Other Ambulatory Visit (HOSPITAL_BASED_OUTPATIENT_CLINIC_OR_DEPARTMENT_OTHER): Payer: Self-pay

## 2022-07-27 ENCOUNTER — Telehealth: Payer: Self-pay | Admitting: Nutrition

## 2022-07-27 ENCOUNTER — Other Ambulatory Visit (HOSPITAL_BASED_OUTPATIENT_CLINIC_OR_DEPARTMENT_OTHER): Payer: Self-pay

## 2022-07-27 MED FILL — Atorvastatin Calcium Tab 80 MG (Base Equivalent): ORAL | 90 days supply | Qty: 90 | Fill #2 | Status: AC

## 2022-07-27 NOTE — Telephone Encounter (Signed)
LVM with her to call me to schedule sensor training

## 2022-07-27 NOTE — Telephone Encounter (Signed)
LVM to call me to schedule sensor training for [employer] sensors,

## 2022-07-27 NOTE — Telephone Encounter (Signed)
LVM to call me for training on how to use her sensors

## 2022-07-28 NOTE — Telephone Encounter (Signed)
I am seeing him on the 11/21.  He is wanting a referral to have spine surgery.  Do you want to get a Hgb A1C on that date.

## 2022-07-28 NOTE — Telephone Encounter (Signed)
I am seeing him on 08/04/22

## 2022-08-03 ENCOUNTER — Other Ambulatory Visit (HOSPITAL_BASED_OUTPATIENT_CLINIC_OR_DEPARTMENT_OTHER): Payer: Self-pay

## 2022-08-04 ENCOUNTER — Ambulatory Visit (INDEPENDENT_AMBULATORY_CARE_PROVIDER_SITE_OTHER): Payer: Medicare Other

## 2022-08-04 ENCOUNTER — Encounter: Payer: Medicare Other | Attending: Internal Medicine | Admitting: Nutrition

## 2022-08-04 DIAGNOSIS — Z794 Long term (current) use of insulin: Secondary | ICD-10-CM

## 2022-08-04 DIAGNOSIS — N186 End stage renal disease: Secondary | ICD-10-CM | POA: Diagnosis present

## 2022-08-04 DIAGNOSIS — Z992 Dependence on renal dialysis: Secondary | ICD-10-CM | POA: Insufficient documentation

## 2022-08-04 DIAGNOSIS — E1122 Type 2 diabetes mellitus with diabetic chronic kidney disease: Secondary | ICD-10-CM | POA: Insufficient documentation

## 2022-08-04 DIAGNOSIS — E1165 Type 2 diabetes mellitus with hyperglycemia: Secondary | ICD-10-CM

## 2022-08-04 LAB — POCT GLYCOSYLATED HEMOGLOBIN (HGB A1C): Hemoglobin A1C: 7.6 % — AB (ref 4.0–5.6)

## 2022-08-04 NOTE — Progress Notes (Signed)
Patient verbally confirmed name, date of birth, and reason for visit. Pt A1C was checked and documented.

## 2022-08-04 NOTE — Progress Notes (Signed)
Patient was shown the Medtronic CGM-Guardian 3.  He did not want to do the 4 finger sticks per day to have there blood sugars go to the pump.  I called [employer] for him, and he is available for the upgrade to the 880 pump with sensors that do not require finger sticks.  Arnetha Courser was told to start the paperwork for this and patient's wife was given the number to call me when her supplies come in for training.  They had no final questions.  They will continue to use the Libre 14 day sensor until his new pump with sensors come in.

## 2022-08-04 NOTE — Patient Instructions (Signed)
Call when you get your new pump.  [phone]

## 2022-08-11 ENCOUNTER — Ambulatory Visit (INDEPENDENT_AMBULATORY_CARE_PROVIDER_SITE_OTHER): Payer: Medicare Other | Admitting: Podiatry

## 2022-08-11 ENCOUNTER — Encounter: Payer: Self-pay | Admitting: Podiatry

## 2022-08-11 ENCOUNTER — Telehealth: Payer: Self-pay | Admitting: Family Medicine

## 2022-08-11 DIAGNOSIS — M2042 Other hammer toe(s) (acquired), left foot: Secondary | ICD-10-CM | POA: Diagnosis not present

## 2022-08-11 DIAGNOSIS — M1990 Unspecified osteoarthritis, unspecified site: Secondary | ICD-10-CM | POA: Diagnosis not present

## 2022-08-11 DIAGNOSIS — M79674 Pain in right toe(s): Secondary | ICD-10-CM | POA: Diagnosis not present

## 2022-08-11 DIAGNOSIS — M79675 Pain in left toe(s): Secondary | ICD-10-CM

## 2022-08-11 DIAGNOSIS — E1040 Type 1 diabetes mellitus with diabetic neuropathy, unspecified: Secondary | ICD-10-CM

## 2022-08-11 DIAGNOSIS — B351 Tinea unguium: Secondary | ICD-10-CM | POA: Diagnosis not present

## 2022-08-11 DIAGNOSIS — M2041 Other hammer toe(s) (acquired), right foot: Secondary | ICD-10-CM

## 2022-08-11 DIAGNOSIS — N186 End stage renal disease: Secondary | ICD-10-CM

## 2022-08-11 NOTE — Progress Notes (Addendum)
This patient returns to my office for at risk foot care.  This patient requires this care by a professional since this patient will be at risk due to having ESRD and DM.  This patient is unable to cut nails himself since the patient cannot reach his nails.These nails are painful walking and wearing shoes. He also requests padding for his second toes. This patient presents for at risk foot care today.  General Appearance  Alert, conversant and in no acute stress.  Vascular  Dorsalis pedis and posterior tibial  pulses are palpable  bilaterally.  Capillary return is within normal limits  bilaterally. Temperature is within normal limits  bilaterally.  Neurologic  Senn-Weinstein monofilament wire test within normal limits  bilaterally. Muscle power within normal limits bilaterally.  Nails Thick disfigured discolored nails with subungual debris  from hallux to fifth toes bilaterally. No evidence of bacterial infection or drainage bilaterally.  Orthopedic  No limitations of motion  feet .  No crepitus or effusions noted.  No bony pathology or digital deformities noted.  Skin  normotropic skin noted bilaterally.  No signs of infections or ulcers noted.   Porokeratosis sub 5th  B/L asymptomatic.  Onychomycosis  Pain in right toes  Pain in left toes  Consent was obtained for treatment procedures.   Mechanical debridement of nails 1-5  bilaterally performed with a nail nipper.  Filed with dremel without incident. Dispense padding.   Return office visit   9 weeks                   Told patient to return for periodic foot care and evaluation due to potential at risk complications.   Gardiner Barefoot DPM    09/23/22  Addendum  findings  Hammer toe  B/L.  HAV  B/L.  Plantar flexed fifth met  B/L.    Gardiner Barefoot DPM

## 2022-08-11 NOTE — Telephone Encounter (Signed)
Left message for patient to call back and schedule Medicare Annual Wellness Visit (AWV) either virtually or phone  Left  my Kenneth [name] number [phone]    awvi 06/14/22 per [facility]  please schedule with Nurse Health Adviser  2   45 min for awv-i and in office appointments 30 min for awv-s  phone/virtual appointments

## 2022-08-14 ENCOUNTER — Encounter: Payer: Self-pay | Admitting: Family Medicine

## 2022-08-14 ENCOUNTER — Other Ambulatory Visit (HOSPITAL_BASED_OUTPATIENT_CLINIC_OR_DEPARTMENT_OTHER): Payer: Self-pay

## 2022-08-14 ENCOUNTER — Ambulatory Visit (INDEPENDENT_AMBULATORY_CARE_PROVIDER_SITE_OTHER): Payer: Medicare Other | Admitting: Family Medicine

## 2022-08-14 VITALS — BP 108/72 | HR 71 | Temp 98.1°F | Ht 65.0 in | Wt 129.2 lb

## 2022-08-14 DIAGNOSIS — I10 Essential (primary) hypertension: Secondary | ICD-10-CM | POA: Diagnosis not present

## 2022-08-14 DIAGNOSIS — Z01818 Encounter for other preprocedural examination: Secondary | ICD-10-CM

## 2022-08-14 MED ORDER — SERTRALINE HCL 50 MG PO TABS
50.0000 mg | ORAL_TABLET | Freq: Every day | ORAL | 2 refills | Status: DC
  Filled 2022-08-14 – 2022-08-22 (×2): qty 90, 90d supply, fill #0

## 2022-08-14 MED ORDER — APIXABAN 5 MG PO TABS
5.0000 mg | ORAL_TABLET | Freq: Two times a day (BID) | ORAL | 2 refills | Status: DC
  Filled 2022-08-14 – 2022-08-22 (×2): qty 180, 90d supply, fill #0
  Filled 2022-12-16: qty 180, 90d supply, fill #1
  Filled 2023-03-15 (×2): qty 180, 90d supply, fill #2

## 2022-08-14 MED ORDER — CARVEDILOL 6.25 MG PO TABS
6.2500 mg | ORAL_TABLET | Freq: Two times a day (BID) | ORAL | 3 refills | Status: DC
  Filled 2022-08-14 – 2022-08-22 (×2): qty 180, 90d supply, fill #0

## 2022-08-14 NOTE — Patient Instructions (Addendum)
Give [place] 2-3 business days to get the results of your labs back.   Stop Eliquis 2 days prior to surgery.   Let us know if you need anything.

## 2022-08-14 NOTE — Progress Notes (Signed)
Subjective:   Chief Complaint  Patient presents with   Follow-up    Check labs today Refill today 90 day    Kenneth [name]  is here for a Pre-operative physical at the request of Dr. Trenton Gammon.   He  is having back surgery not scheduled yet due to clearance requirement.  Personal or family hx of adverse outcome to anesthesia? No  Chipped, cracked, missing, or loose teeth? No  Decreased ROM of neck? No  Able to walk up 2 flights of stairs without becoming significantly short of breath or having chest pain? Yes   Revised Goldman Criteria: High Risk Surgery (intraperitoneal, intrathoracic, aortic): No  Ischemic heart disease (Prior MI, +excercise stress test, angina, nitrate use, Qwave): Yes History of heart failure: Yes  History of cerebrovascular disease: No  History of diabetes: Yes  Insulin therapy for DM: Yes  Preoperative Cr >2.0: Yes   Revised Goldman Criteria - risk for major cardiac death No risk factors -- 0.4 percent One risk factor -- 1.0 percent  Two risk factors -- 2.4 percent  Three or more risk factors -- 5.4 percent   Patient Active Problem List   Diagnosis Date Noted   Chronic arthritis 08/11/2022   Tinea pedis 06/09/2022   Orthostatic hypotension 04/15/2022   Elevated troponin 19/14/7829   Complication associated with dialysis catheter 56/21/3086   Complication of AV dialysis fistula 04/15/2022   ESRD (end stage renal disease) ([place]) 04/09/2022   Pulmonary emphysema ([place]) 02/21/2022   Acute on chronic anemia 02/21/2022   Type 2 diabetes mellitus with hyperglycemia, with long-term current use of insulin ([place]) 10/28/2021   Type 2 diabetes mellitus with diabetic polyneuropathy, with long-term current use of insulin ([place]) 10/28/2021   Hypertension associated with diabetes ([place]) 09/16/2021   Chronic bilateral low back pain with bilateral sciatica 09/16/2021   Partial seizure disorder ([place])    Acute respiratory failure with hypoxia ([place]) 07/28/2021   Prolonged QT interval  01/01/2021   Hemoptysis 01/01/2021   Occult blood in stools    Symptomatic bradycardia 12/15/2020   Postural dizziness with presyncope 12/15/2020   Blood in stool 57/84/6962   Acute metabolic encephalopathy 95/28/4132   Altered mental status 08/27/2019   Unresponsive episode 08/27/2019   AF (paroxysmal atrial fibrillation) ([place]) 08/27/2019   Status post coronary artery stent placement    NSTEMI (non-ST elevated myocardial infarction) ([place]) 03/30/2018   Hypertensive heart disease 09/12/2017   Hyperlipidemia associated with type 2 diabetes mellitus ([place]) 09/12/2017   Hyperkalemia 07/20/2017   Chronic diastolic CHF (congestive heart failure) ([place]) 04/14/2017   Hypertensive emergency 04/14/2017   Anemia, chronic renal failure 04/11/2017   Fluid overload 04/11/2017   Diabetic foot ulcer ([place]) 04/11/2017   Cellulitis in diabetic foot ([place]) 01/18/2017   Chest pain 12/16/2016   Hypertensive urgency 12/16/2016   Pressure injury of skin 11/03/2016   Type 2 diabetes mellitus with chronic kidney disease on chronic dialysis, with long-term current use of insulin ([place]) 10/31/2016   ESRD (end stage renal disease) on dialysis ([place]) 10/30/2016   Atherosclerotic heart disease of native coronary artery with other forms of angina pectoris with elevated troponin     Gastroesophageal reflux disease 08/18/2016   HCAP (healthcare-associated pneumonia)    Non-ST elevation (NSTEMI) myocardial infarction ([place])    Pronation deformity of both feet 02/27/2015   Metatarsal deformity 02/27/2015   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   type 1 DM   ESRD on hemodialysis ([place])    "MWF; [facility]" (03/30/2018)   Gastroesophageal reflux disease 08/18/2016   Hyperlipidemia    Hypertension    Ischemic rest pain of lower extremity 02/06/2015   Keratoma 02/27/2015   Metatarsal deformity 02/27/2015   PAF (paroxysmal atrial fibrillation) (HCC)    Pronation deformity of both feet 02/27/2015    Past Surgical History:  Procedure Laterality Date   A/V FISTULAGRAM Left 03/06/2021   Procedure: A/V FISTULAGRAM;  Surgeon: Marty Heck, MD;  Location: [place] [facility];  Service: Cardiovascular;  Laterality: Left;   A/V FISTULAGRAM Left 06/02/2022   Procedure: A/V Fistulagram;  Surgeon: Serafina Mitchell, MD;  Location: [place] [facility];  Service: Cardiovascular;  Laterality: Left;   AV FISTULA PLACEMENT Left 06/22/2016   Procedure: ARTERIOVENOUS (AV) FISTULA CREATION LEFT UPPER ARM;  Surgeon: Rosetta Posner, MD;  Location: [place];  Service: Vascular;  Laterality: Left;   BIOPSY  12/19/2020   Procedure: BIOPSY;  Surgeon: Ladene Artist, MD;  Location: [place];  Service: Endoscopy;;   CARDIAC CATHETERIZATION N/A 05/31/2016   Procedure: Left Heart Cath and Coronary Angiography;  Surgeon: Jettie Booze, MD;  Location: [place] [facility];  Service: Cardiovascular;  Laterality: N/A;   CARDIAC CATHETERIZATION N/A 05/31/2016   Procedure: Right Heart Cath;  Surgeon: Jettie Booze, MD;  Location: [place] [facility];  Service: Cardiovascular;  Laterality: N/A;   CARDIAC CATHETERIZATION N/A 05/31/2016   Procedure: IABP Insertion;  Surgeon: Jettie Booze, MD;  Location: [place] [facility];  Service: Cardiovascular;  Laterality: N/A;   COLONOSCOPY WITH PROPOFOL N/A 12/20/2020   Procedure: COLONOSCOPY WITH PROPOFOL;  Surgeon: Mauri Pole, MD;  Location: [facility] [facility];  Service: Endoscopy;  Laterality: N/A;   CORONARY ARTERY BYPASS GRAFT N/A 06/05/2016   Procedure: CORONARY ARTERY BYPASS GRAFTING (CABG) x3  LIMA to LAD -SVG to OM -SVG to RCA;  Surgeon: Ivin Poot, MD;  Location: [place];  Service: Open Heart Surgery;  Laterality: N/A;   CORONARY STENT INTERVENTION N/A 07/07/2017   Procedure: CORONARY STENT INTERVENTION;  Surgeon: Wellington Hampshire, MD;  Location: [place] [facility];  Service: Cardiovascular;  Laterality: N/A;   CORONARY STENT INTERVENTION N/A 03/30/2018   Procedure: CORONARY STENT INTERVENTION;  Surgeon: Martinique, Peter M, MD;  Location: [place] [facility];  Service: Cardiovascular;  Laterality: N/A;   ESOPHAGOGASTRODUODENOSCOPY (EGD) WITH PROPOFOL N/A 12/19/2020   Procedure: ESOPHAGOGASTRODUODENOSCOPY (EGD) WITH PROPOFOL;  Surgeon: Ladene Artist, MD;  [facility];  Service: Endoscopy;  Laterality: N/A;   EXCHANGE OF A DIALYSIS CATHETER Right 04/16/2022   Procedure: EXCHANGE OF A DIALYSIS CATHETER;  Surgeon: Cherre Robins, MD;  Location: [place];  Service: Vascular;  Laterality: Right;   GIVENS CAPSULE STUDY N/A 12/20/2020   Procedure: GIVENS CAPSULE STUDY;  Surgeon: Mauri Pole, MD;  Location: [place] [facility];  Service: Endoscopy;  Laterality: N/A;   INSERTION OF DIALYSIS CATHETER N/A 06/05/2016   Procedure: INSERTION OF DIALYSIS/trialysis CATHETER;  Surgeon: Ivin Poot, MD;  Location: [place];  Service: Vascular;  Laterality: N/A;   INSERTION OF DIALYSIS  CATHETER Right 06/13/2016   Procedure: INSERTION OF DIALYSIS CATHETER RIGHT INTERNAL JUGULAR;  Surgeon: Conrad Bairdford, MD;  Location: [place];  Service: Vascular;  Laterality: Right;   INSERTION OF DIALYSIS CATHETER Right 09/07/2019   Procedure: Insertion Of Dialysis Catheter;  Surgeon: Rosetta Posner, MD;  Location[facility];  Service: Vascular;  Laterality: Right;   INSERTION OF DIALYSIS CATHETER N/A 04/09/2022   Procedure: INSERTION OF TUNNELED DIALYSIS CATHETER;  Surgeon: Cherre Robins, MD;  Location: [place];  Service: Vascular;  Laterality: N/A;   INTRAOPERATIVE TRANSESOPHAGEAL ECHOCARDIOGRAM N/A 06/05/2016    Procedure: INTRAOPERATIVE TRANSESOPHAGEAL ECHOCARDIOGRAM;  Surgeon: Ivin Poot, MD;  Location: [place];  Service: Open Heart Surgery;  Laterality: N/A;   LEFT HEART CATH AND CORS/GRAFTS ANGIOGRAPHY N/A 11/03/2016   Procedure: Left Heart Cath and Cors/Grafts Angiography;  Surgeon: Troy Sine, MD;  Location: [place] [facility];  Service: Cardiovascular;  Laterality: N/A;   LEFT HEART CATH AND CORS/GRAFTS ANGIOGRAPHY N/A 07/07/2017   Procedure: LEFT HEART CATH AND CORS/GRAFTS ANGIOGRAPHY;  Surgeon: Larey Dresser, MD;  Location: [place] [facility];  Service: Cardiovascular;  Laterality: N/A;   LEFT HEART CATH AND CORS/GRAFTS ANGIOGRAPHY N/A 03/30/2018   Procedure: LEFT HEART CATH AND CORS/GRAFTS ANGIOGRAPHY;  Surgeon: Martinique, Peter M, MD;  Location: [place] [facility];  Service: Cardiovascular;  Laterality: N/A;   LEFT HEART CATH AND CORS/GRAFTS ANGIOGRAPHY N/A 04/17/2019   Procedure: LEFT HEART CATH AND CORS/GRAFTS ANGIOGRAPHY;  Surgeon: Larey Dresser, MD;  Location: [place] [facility];  Service: Cardiovascular;  Laterality: N/A;   PERIPHERAL VASCULAR BALLOON ANGIOPLASTY Left 06/02/2022   Procedure: PERIPHERAL VASCULAR BALLOON ANGIOPLASTY;  Surgeon: Serafina Mitchell, MD;  Location: [place] [facility];  Service: Cardiovascular;  Laterality: Left;   PERIPHERAL VASCULAR INTERVENTION Left 03/06/2021   Procedure: PERIPHERAL VASCULAR INTERVENTION;  Surgeon: Marty Heck, MD;  Location: [place] [facility];  Service: Cardiovascular;  Laterality: Left;   POLYPECTOMY  12/20/2020   Procedure: POLYPECTOMY;  Surgeon: Mauri Pole, MD;  Location: [place] [facility];  Service: Endoscopy;;   REVISION OF ARTERIOVENOUS GORETEX GRAFT Left 18/29/9371   Procedure: PLICATION OF ANEURYSM OF ARTERIOVENOUS FISTULA  LEFT ARM;  Surgeon: Rosetta Posner, MD;  Location: [place];  Service: Vascular;  Laterality: Left;   REVISON OF ARTERIOVENOUS FISTULA Left 09/07/2019   Procedure: REVISON OF LEFT UPPER ARM ARTERIOVENOUS  FISTULA;  Surgeon: Rosetta Posner, MD;  Location: [place];  Service: Vascular;  Laterality: Left;   REVISON OF ARTERIOVENOUS FISTULA Left 04/09/2022   Procedure: LEFT ARM FISTULA REVISION;  Surgeon: Cherre Robins, MD;  Location: [place];  Service: Vascular;  Laterality: Left;  PERIPHERAL NERVE BLOCK   REVISON OF ARTERIOVENOUS FISTULA Left 04/16/2022   Procedure: REVISON OF ARTERIOVENOUS FISTULA;  Surgeon: Cherre Robins, MD;  Location: [place];  Service: Vascular;  Laterality: Left;   RIGHT/LEFT HEART CATH AND CORONARY ANGIOGRAPHY N/A 01/03/2021   Procedure: RIGHT/LEFT HEART CATH AND CORONARY ANGIOGRAPHY;  Surgeon: Larey Dresser, MD;  Location: [place] [facility];  Service: Cardiovascular;  Laterality: N/A;    Current Outpatient Medications  Medication Sig Dispense Refill   atorvastatin (LIPITOR) 80 MG tablet Take 1 tablet (80 mg total) by mouth daily. 90 tablet 3   calcium acetate (PHOSLO) 667 MG capsule Take 667 mg by mouth 3 (three) times daily with meals.      Continuous Blood Gluc Sensor (FREESTYLE LIBRE 2 SENSOR) MISC 1 Device by Does  not apply route every 14 (fourteen) days. 6 each 3   Continuous Blood Gluc Sensor (GUARDIAN SENSOR 3) MISC 1 Device by Does not apply route as directed. 9 each 3   Continuous Blood Gluc Transmit (GUARDIAN LINK 3 TRANSMITTER) MISC 1 Device by Does not apply route as directed. 1 each 3   Darbepoetin Alfa (ARANESP, ALBUMIN FREE, IJ) Darbepoetin Alfa (Aranesp)     diclofenac Sodium (VOLTAREN) 1 % GEL Apply 4 grams topically 4 (four) times daily. 100 g 2   ezetimibe (ZETIA) 10 MG tablet Take 1 tablet by mouth daily 90 tablet 3   glucose blood (ONETOUCH VERIO) test strip 1 each by Other route in the morning, at noon, in the evening, and at bedtime. Use as instructed 400 each 2   heparin 1000 unit/mL SOLN injection Heparin Sodium (Porcine) 1,000 Units/mL Catheter Lock Venous     hydrALAZINE (APRESOLINE) 100 MG tablet Take 1 tablet (100 mg total) by mouth 3 (three) times  daily. DO NOT TAKE AM AND NOON DOSES ON HD DAYS 270 tablet 2   influenza vaccine adjuvanted (FLUAD QUADRIVALENT) 0.5 ML injection Inject into the muscle. 0.5 mL 0   Insulin Lispro-aabc (LYUMJEV) 100 UNIT/ML SOLN Max Daily 30 units per insulin pump 30 mL 6   Insulin Pen Needle (PEN NEEDLES) 32G X 6 MM MISC Use as directed 3 times daily 300 each 1   isosorbide mononitrate (IMDUR) 60 MG 24 hr tablet Take 1&1/2 tablets (90 mg total) by mouth daily. Hold on days of dialysis on Mon, Wed, Fri. 90 tablet 5   lidocaine (LIDODERM) 5 % Apply 1 patch to skin as directed every day (May wear up to 12hours.) 30 patch 0   megestrol (MEGACE ES) 625 MG/5ML suspension Take 5 mLs (625 mg total) by mouth daily. 150 mL 1   Methoxy PEG-Epoetin Beta (MIRCERA IJ) Inject as directed.     pantoprazole (PROTONIX) 40 MG tablet Take 1 tablet (40 mg total) by mouth 2 (two) times daily. 180 tablet 1   Sodium Zirconium Cyclosilicate (LOKELMA PO) Take 1 packet by mouth 4 (four) times a week. Hold on dialysis days     Tiotropium Bromide-Olodaterol (STIOLTO RESPIMAT) 2.5-2.5 MCG/ACT AERS Inhale 2 puffs by mouth into the lungs daily. 4 g 11   traMADol (ULTRAM) 50 MG tablet Take 1-2 tablets (50-100 mg total) by mouth every 6 (six) hours as needed for pain 40 tablet 0   apixaban (ELIQUIS) 5 MG TABS tablet Take 1 tablet (5 mg total) by mouth 2 (two) times daily. 180 tablet 2   carvedilol (COREG) 6.25 MG tablet Take 1 tablet (6.25 mg total) by mouth 2 (two) times daily with a meal. Hold on the mornings of dialysis. 180 tablet 3   sertraline (ZOLOFT) 50 MG tablet Take 1 tablet (50 mg total) by mouth daily. 90 tablet 2    No Known Allergies  Family History  Problem Relation Age of Onset   CAD Father    Colon cancer Father    Diabetes Brother      Review of Systems:  Constitutional:  no fevers Eye:  no recent significant change in vision Ear:  no hearing loss Nose/Mouth/Throat:  No dental complaints Neck/Thyroid:  no lumps or  masses Pulmonary:  No shortness of breath Cardiovascular:  no chest pain Gastrointestinal:  no abdominal pain GU:  negative for dysuria Musculoskeletal/Extremities:  no pain Skin/Integumentary ROS:  no abnormal skin lesions reported Neurologic:  no HA   Objective:  Vitals:   08/14/22 1507  BP: 108/72  Pulse: 71  Temp: 98.1 F (36.7 C)  TempSrc: Oral  SpO2: 98%  Weight: 129 lb 4 oz (58.6 kg)  Height: 5\' 5"  (1.651 m)   Body mass index is 21.51 kg/m.  General:  well developed, well nourished, in no apparent distress Skin:  warm, no pallor or diaphoresis Head:  normocephalic, atraumatic Eyes:  pupils equal and round, sclera anicteric without injection Ears:  canals without lesions, TMs shiny without retraction, no obvious effusion, no erythema Throat/Pharynx:  lips and gingiva without lesion; tongue and uvula midline; non-inflamed pharynx; no exudates or postnasal drainage Neck: neck supple without adenopathy, thyromegaly, or masses, no bruits, no jugular venous distention Lungs:  clear to auscultation, breath sounds equal bilaterally, no respiratory distress Cardio:  regular rate and rhythm without murmurs Abdomen:  abdomen soft, nontender; bowel sounds normal; no masses, hepatomegaly or splenomegaly Musculoskeletal:  symmetrical muscle groups noted without atrophy or deformity Extremities:  no clubbing, cyanosis, or edema, no deformities, no skin discoloration Neuro:  gait slow; deep tendon reflexes normal and symmetric and alert and oriented to person, place, and time Psych: Age appropriate judgment and insight; normal mood   Assessment:   Pre-op exam  Essential hypertension - Plan: carvedilol (COREG) 6.25 MG tablet   Plan:   Orders as above. 5.4% risk of major cardiac even based on revised Goldman criteria. He is medically optimized despite this. Will stop Eliquis 2 days prior to surgery.  Last A1c was 7.6 on 08/04/22 with endo team.  He reports Dr. Annette Stable is also  requesting clearance from this cardiologist.   The patient voiced understanding and agreement to the plan.  Susan Moore, DO 08/14/22  3:22 PM

## 2022-08-15 LAB — CBC
HCT: 33 % — ABNORMAL LOW (ref 38.5–50.0)
Hemoglobin: 11.5 g/dL — ABNORMAL LOW (ref 13.2–17.1)
MCH: 31.8 pg (ref 27.0–33.0)
MCHC: 34.8 g/dL (ref 32.0–36.0)
MCV: 91.2 fL (ref 80.0–100.0)
MPV: 10.5 fL (ref 7.5–12.5)
Platelets: 158 10*3/uL (ref 140–400)
RBC: 3.62 10*6/uL — ABNORMAL LOW (ref 4.20–5.80)
RDW: 13.3 % (ref 11.0–15.0)
WBC: 4.8 10*3/uL (ref 3.8–10.8)

## 2022-08-15 LAB — BASIC METABOLIC PANEL
BUN/Creatinine Ratio: 5 (calc) — ABNORMAL LOW (ref 6–22)
BUN: 21 mg/dL (ref 7–25)
CO2: 36 mmol/L — ABNORMAL HIGH (ref 20–32)
Calcium: 8.7 mg/dL (ref 8.6–10.3)
Chloride: 92 mmol/L — ABNORMAL LOW (ref 98–110)
Creat: 3.86 mg/dL — ABNORMAL HIGH (ref 0.70–1.35)
Glucose, Bld: 164 mg/dL — ABNORMAL HIGH (ref 65–99)
Potassium: 4.2 mmol/L (ref 3.5–5.3)
Sodium: 138 mmol/L (ref 135–146)

## 2022-08-21 ENCOUNTER — Other Ambulatory Visit (HOSPITAL_BASED_OUTPATIENT_CLINIC_OR_DEPARTMENT_OTHER): Payer: Self-pay

## 2022-08-21 ENCOUNTER — Other Ambulatory Visit: Payer: Self-pay | Admitting: Neurosurgery

## 2022-08-21 ENCOUNTER — Telehealth: Payer: Self-pay | Admitting: Family Medicine

## 2022-08-21 NOTE — Telephone Encounter (Signed)
Not sure if a DME can be done for a hospital bed?

## 2022-08-21 NOTE — Telephone Encounter (Signed)
Scheduled appt for need for Hospital bed.

## 2022-08-21 NOTE — Telephone Encounter (Signed)
Pt's family member is requesting that pt is needing a hospital bed order for him to have at home. Please contact Bonnita Nasuti at [phone] when orders is in.  Please advise.

## 2022-08-24 ENCOUNTER — Other Ambulatory Visit (HOSPITAL_BASED_OUTPATIENT_CLINIC_OR_DEPARTMENT_OTHER): Payer: Self-pay

## 2022-08-25 ENCOUNTER — Encounter: Payer: Self-pay | Admitting: Family Medicine

## 2022-08-25 ENCOUNTER — Ambulatory Visit (INDEPENDENT_AMBULATORY_CARE_PROVIDER_SITE_OTHER): Payer: Medicare Other | Admitting: Family Medicine

## 2022-08-25 VITALS — BP 118/64 | HR 72 | Temp 98.3°F | Ht 65.0 in | Wt 136.5 lb

## 2022-08-25 DIAGNOSIS — R2681 Unsteadiness on feet: Secondary | ICD-10-CM | POA: Diagnosis not present

## 2022-08-25 DIAGNOSIS — Z992 Dependence on renal dialysis: Secondary | ICD-10-CM | POA: Diagnosis not present

## 2022-08-25 DIAGNOSIS — M48062 Spinal stenosis, lumbar region with neurogenic claudication: Secondary | ICD-10-CM

## 2022-08-25 DIAGNOSIS — N186 End stage renal disease: Secondary | ICD-10-CM | POA: Diagnosis not present

## 2022-08-25 NOTE — Patient Instructions (Signed)
We will do our best to get this approved, but in the end it will be up to Medicare to approve or deny.   Ice/cold pack over area for 10-15 min twice daily.  Heat (pad or rice pillow in microwave) over affected area, 10-15 minutes twice daily.   Let us know if you need anything.

## 2022-08-25 NOTE — Progress Notes (Signed)
Chief Complaint  Patient presents with   needs to order a Hospital bed    Subjective: Patient is a 66 y.o. male here for discussion of getting a hosp bed. Here w spouse. Here w aid of video interpreter ([place]).   Has neurosurgery next week for neuro claudication from lumbar stenosis. Hoping to get a hospital bed due to risk of falling and continued weakness.   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   type 1 DM   ESRD on hemodialysis ([place])    "MWF; [facility]" (03/30/2018)   Gastroesophageal reflux disease 08/18/2016   Hyperlipidemia    Hypertension    Ischemic rest pain of lower extremity 02/06/2015   Keratoma 02/27/2015   Metatarsal deformity 02/27/2015   PAF (paroxysmal atrial fibrillation) (HCC)    Pronation deformity of both feet 02/27/2015    Objective: BP 118/64 (BP Location: Right Arm, Patient Position: Sitting, Cuff Size: Normal)   Pulse 72   Temp 98.3 F (36.8 C) (Oral)   Ht 5\' 5"  (1.651 m)   Wt 136 lb 8 oz (61.9 kg)   SpO2 97%   BMI 22.71 kg/m  General: Awake, appears stated age Neuro: Patellar DTR's 2/4 w/o clonus, no cerebellar signs, 4/5 R knee flexion, 5/5 in LE's otherwise, gait is cautious and slow favoring RLE  Lungs: No accessory muscle use Psych: Age appropriate judgment and insight, normal affect and mood  Assessment and Plan: Lumbar stenosis with neurogenic claudication - Plan: For home use only DME Hospital bed  ESRD (end stage renal disease) on dialysis [facility]) - Plan: For home use only DME Hospital bed  Unsteady gait when walking - Plan: For home use only DME Hospital bed  Will try to get hospital bed covered to help him mobilize for post op care.  The patient and  his spouse thru the interpreter voiced understanding and agreement to the plan.  [place], DO 08/25/22  2:11 PM

## 2022-08-26 ENCOUNTER — Encounter (HOSPITAL_COMMUNITY): Payer: Self-pay

## 2022-08-26 NOTE — Pre-Procedure Instructions (Signed)
Surgical Instructions    Your procedure is scheduled on Tuesday, December 19th.  Report to [facility] Main Entrance "A" at 09:45 A.M., then check in with the Admitting office.  Call this number if you have problems the morning of surgery:  [phone]   If you have any questions prior to your surgery date call [phone]: Open Monday-Friday 8am-4pm    Remember:  Do not eat or drink after midnight the night before your surgery      Take these medicines the morning of surgery with A SIP OF WATER  atorvastatin (LIPITOR)  carvedilol (COREG)  ezetimibe (ZETIA)  hydrALAZINE (APRESOLINE)  isosorbide mononitrate (IMDUR)  pantoprazole (PROTONIX)  sertraline (ZOLOFT)  Tiotropium Bromide-Olodaterol (STIOLTO RESPIMAT)    If needed: traMADol (ULTRAM)    Follow your surgeon's instructions on when to stop Eliquis.  If no instructions were given by your surgeon then you will need to call the office to get those instructions.     As of today, STOP taking any Aspirin (unless otherwise instructed by your surgeon) Aleve, Naproxen, Ibuprofen, Motrin, Advil, Goody's, BC's, all herbal medications, fish oil, and all vitamins. This includes your diclofenac Sodium (VOLTAREN) 1 % GEL.   WHAT DO I DO ABOUT MY DIABETES MEDICATION?   Reduce insulin pump basal rate by 20% at bedtime the night before surgery (12/18). Rate should be reduced to 24 units on insulin pump.    HOW TO MANAGE YOUR DIABETES BEFORE AND AFTER SURGERY  Why is it important to control my blood sugar before and after surgery? Improving blood sugar levels before and after surgery helps healing and can limit problems. A way of improving blood sugar control is eating a healthy diet by:  Eating less sugar and carbohydrates  Increasing activity/exercise  Talking with your doctor about reaching your blood sugar goals High blood sugars (greater than 180 mg/dL) can raise your risk of infections and slow your recovery, so you will  need to focus on controlling your diabetes during the weeks before surgery. Make sure that the doctor who takes care of your diabetes knows about your planned surgery including the date and location.  How do I manage my blood sugar before surgery? Check your blood sugar at least 4 times a day, starting 2 days before surgery, to make sure that the level is not too high or low.  Check your blood sugar the morning of your surgery when you wake up and every 2 hours until you get to the Short Stay unit.  If your blood sugar is less than 70 mg/dL, you will need to treat for low blood sugar: Do not take insulin. Treat a low blood sugar (less than 70 mg/dL) with  cup of clear juice (cranberry or apple), 4 glucose tablets, OR glucose gel. Recheck blood sugar in 15 minutes after treatment (to make sure it is greater than 70 mg/dL). If your blood sugar is not greater than 70 mg/dL on recheck, call [phone] for further instructions. Report your blood sugar to the short stay nurse when you get to Short Stay.  If you are admitted to the hospital after surgery: Your blood sugar will be checked by the staff and you will probably be given insulin after surgery (instead of oral diabetes medicines) to make sure you have good blood sugar levels. The goal for blood sugar control after surgery is 80-180 mg/dL.                     Do  NOT Smoke (Tobacco/Vaping) for 24 hours prior to your procedure.  If you use a CPAP at night, you may bring your mask/headgear for your overnight stay.   Contacts, glasses, piercing's, hearing aid's, dentures or partials may not be worn into surgery, please bring cases for these belongings.    For patients admitted to the hospital, discharge time will be determined by your treatment team.   Patients discharged the day of surgery will not be allowed to drive home, and someone needs to stay with them for 24 hours.  SURGICAL WAITING ROOM VISITATION Patients having surgery or a  procedure may have no more than 2 support people in the waiting area - these visitors may rotate.   Children under the age of 41 must have an adult with them who is not the patient. If the patient needs to stay at the hospital during part of their recovery, the visitor guidelines for inpatient rooms apply. Pre-op nurse will coordinate an appropriate time for 1 support person to accompany patient in pre-op.  This support person may not rotate.   Please refer to the [facility] website for the visitor guidelines for Inpatients (after your surgery is over and you are in a regular room).    Special instructions:   [place]- Preparing For Surgery  Before surgery, you can play an important role. Because skin is not sterile, your skin needs to be as free of germs as possible. You can reduce the number of germs on your skin by washing with CHG (chlorahexidine gluconate) Soap before surgery.  CHG is an antiseptic cleaner which kills germs and bonds with the skin to continue killing germs even after washing.    Oral Hygiene is also important to reduce your risk of infection.  Remember - BRUSH YOUR TEETH THE MORNING OF SURGERY WITH YOUR REGULAR TOOTHPASTE  Please do not use if you have an allergy to CHG or antibacterial soaps. If your skin becomes reddened/irritated stop using the CHG.  Do not shave (including legs and underarms) for at least 48 hours prior to first CHG shower. It is OK to shave your face.  Please follow these instructions carefully.   Shower the NIGHT BEFORE SURGERY and the MORNING OF SURGERY  If you chose to wash your hair, wash your hair first as usual with your normal shampoo.  After you shampoo, rinse your hair and body thoroughly to remove the shampoo.  Use CHG Soap as you would any other liquid soap. You can apply CHG directly to the skin and wash gently with a scrungie or a clean washcloth.   Apply the CHG Soap to your body ONLY FROM THE NECK DOWN.  Do not use on open  wounds or open sores. Avoid contact with your eyes, ears, mouth and genitals (private parts). Wash Face and genitals (private parts)  with your normal soap.   Wash thoroughly, paying special attention to the area where your surgery will be performed.  Thoroughly rinse your body with warm water from the neck down.  DO NOT shower/wash with your normal soap after using and rinsing off the CHG Soap.  Pat yourself dry with a CLEAN TOWEL.  Wear CLEAN PAJAMAS to bed the night before surgery  Place CLEAN SHEETS on your bed the night before your surgery  DO NOT SLEEP WITH PETS.   Day of Surgery: Take a shower with CHG soap. Do not wear jewelry  Do not wear lotions, powders, colognes, or deodorant. Men may shave face and neck.  Do not bring valuables to the hospital. [facility] is not responsible for any belongings or valuables. Wear Clean/Comfortable clothing the morning of surgery Remember to brush your teeth WITH YOUR REGULAR TOOTHPASTE.   Please read over the following fact sheets that you were given.    If you received a COVID test during your pre-op visit  it is requested that you wear a mask when out in public, stay away from anyone that may not be feeling well and notify your surgeon if you develop symptoms. If you have been in contact with anyone that has tested positive in the last 10 days please notify you surgeon.

## 2022-08-26 NOTE — Progress Notes (Signed)
PCP - Dr Eustace Quail Cardiologist - Dr Loralie Champagne Nephrology - Dr Corliss Parish  Chest x-ray - 04/16/22 (1V) EKG - 04/15/22 Stress Test - 11/15/18 ECHO - 11/28/20 Cardiac Cath - 01/03/21  ICD Pacemaker/Loop - n/a  Sleep Study -  n/a CPAP - none  Diabetes Type 1 - Insulin Lispro Pump:  Reduce basal rate by 20 @ at midnight on Monday, 08/31/22.        If your blood sugar is less than 70 mg/dL, you will need to treat for low blood sugar: Treat a low blood sugar (less than 70 mg/dL) with  cup of clear juice (cranberry or apple), 4 glucose tablets, OR glucose gel. Recheck blood sugar in 15 minutes after treatment (to make sure it is greater than 70 mg/dL). If your blood sugar is not greater than 70 mg/dL on recheck, call [phone] for further instructions.  Blood Thinner Instructions:  Follow your surgeon's instructions on when to stop Eliquis (3 days) prior to surgery.  Patient stated he was already made aware of the above information by his MD.  Anesthesia review: Yes   STOP now taking any Aspirin (unless otherwise instructed by your surgeon), Aleve, Naproxen, Ibuprofen, Motrin, Advil, Goody's, BC's, all herbal medications, fish oil, and all vitamins.   Coronavirus Screening Do you have any of the following symptoms:  Cough yes/no: No Fever (>100.52F)  yes/no: No Runny nose yes/no: No Sore throat yes/no: No Difficulty breathing/shortness of breath  yes/no: No  Have you traveled in the last 14 days and where? yes/no: No  Patient verbalized understanding of instructions that were given to them at the PAT appointment via [place]. Kenneth [name].  Patient was also instructed that they will need to review over the PAT instructions again at home before surgery.

## 2022-08-27 ENCOUNTER — Encounter (HOSPITAL_COMMUNITY): Payer: Self-pay

## 2022-08-27 ENCOUNTER — Encounter (HOSPITAL_COMMUNITY)
Admission: RE | Admit: 2022-08-27 | Discharge: 2022-08-27 | Disposition: A | Discharge status: Home / Self Care | Payer: Medicare Other | Source: Ambulatory Visit | Attending: Neurosurgery | Admitting: Neurosurgery

## 2022-08-27 ENCOUNTER — Other Ambulatory Visit: Payer: Self-pay

## 2022-08-27 VITALS — BP 118/52 | HR 69 | Temp 97.7°F | Resp 18 | Ht 65.0 in | Wt 132.2 lb

## 2022-08-27 DIAGNOSIS — Z01812 Encounter for preprocedural laboratory examination: Secondary | ICD-10-CM | POA: Insufficient documentation

## 2022-08-27 DIAGNOSIS — N186 End stage renal disease: Secondary | ICD-10-CM | POA: Insufficient documentation

## 2022-08-27 DIAGNOSIS — K219 Gastro-esophageal reflux disease without esophagitis: Secondary | ICD-10-CM | POA: Insufficient documentation

## 2022-08-27 DIAGNOSIS — I252 Old myocardial infarction: Secondary | ICD-10-CM | POA: Insufficient documentation

## 2022-08-27 DIAGNOSIS — I132 Hypertensive heart and chronic kidney disease with heart failure and with stage 5 chronic kidney disease, or end stage renal disease: Secondary | ICD-10-CM | POA: Diagnosis not present

## 2022-08-27 DIAGNOSIS — I480 Paroxysmal atrial fibrillation: Secondary | ICD-10-CM | POA: Diagnosis not present

## 2022-08-27 DIAGNOSIS — Z992 Dependence on renal dialysis: Secondary | ICD-10-CM | POA: Insufficient documentation

## 2022-08-27 DIAGNOSIS — Z794 Long term (current) use of insulin: Secondary | ICD-10-CM | POA: Insufficient documentation

## 2022-08-27 DIAGNOSIS — Z87891 Personal history of nicotine dependence: Secondary | ICD-10-CM | POA: Diagnosis not present

## 2022-08-27 DIAGNOSIS — E785 Hyperlipidemia, unspecified: Secondary | ICD-10-CM | POA: Diagnosis not present

## 2022-08-27 DIAGNOSIS — Z9641 Presence of insulin pump (external) (internal): Secondary | ICD-10-CM | POA: Diagnosis not present

## 2022-08-27 DIAGNOSIS — E1122 Type 2 diabetes mellitus with diabetic chronic kidney disease: Secondary | ICD-10-CM | POA: Diagnosis not present

## 2022-08-27 DIAGNOSIS — M48061 Spinal stenosis, lumbar region without neurogenic claudication: Secondary | ICD-10-CM | POA: Diagnosis not present

## 2022-08-27 DIAGNOSIS — I2510 Atherosclerotic heart disease of native coronary artery without angina pectoris: Secondary | ICD-10-CM | POA: Diagnosis not present

## 2022-08-27 DIAGNOSIS — Z951 Presence of aortocoronary bypass graft: Secondary | ICD-10-CM | POA: Diagnosis not present

## 2022-08-27 DIAGNOSIS — I509 Heart failure, unspecified: Secondary | ICD-10-CM | POA: Diagnosis not present

## 2022-08-27 DIAGNOSIS — Z01818 Encounter for other preprocedural examination: Secondary | ICD-10-CM

## 2022-08-27 HISTORY — DX: Dyspnea, unspecified: R06.00

## 2022-08-27 HISTORY — DX: Unspecified osteoarthritis, unspecified site: M19.90

## 2022-08-27 LAB — SURGICAL PCR SCREEN
MRSA, PCR: NEGATIVE
Staphylococcus aureus: POSITIVE — AB

## 2022-08-27 LAB — GLUCOSE, CAPILLARY: Glucose-Capillary: 194 mg/dL — ABNORMAL HIGH (ref 70–99)

## 2022-08-28 NOTE — Progress Notes (Signed)
Anesthesia Chart Review:  Case: [id number] Date/Time: 09/01/22 1127   Procedure: Laminectomy and Foraminotomy - bilateral - L3-L4 (Bilateral: Back) - 3C   Anesthesia type: General   Pre-op diagnosis: Stenosis   Location: [facility] OR ROOM 66 / [facility]   Surgeons: Earnie Larsson, MD       DISCUSSION: Patient is a [place] speaking 66 year old male scheduled for the above procedure.  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, 04/09/22), CAD (NSTEMI 05/2016, s/p CABG: LIMA-LAD, SVG-OM, SVG-RCA 06/05/16; NSTEMI, occluded SVG-RCA 11/03/16 with collaterals, medical therapy; NSTEMI 95% ostial SVG-OM1, s/p DES 07/07/17; NSTEMI, s/p DES x2 native OM1 03/30/18; elevated/flat troponins 04/17/19, occluded SVG-RCA & SVG-OM, patent LIMA-LAD, 80% ostial small AV LCx, no PCI target, medical therapy; stable anatomy 01/03/21), CHF, PAF (post-CABG), metatarsal deformity (pronation of feet), DM2 (on insulin pump), GERD, HLD, HTN, PAD (ABI WNL 12/15/18), toxoplasms of the iris (s/p Atovaquone), (+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  He has an insulin pump (Lispro 100 Units/mL). Will order DM Coordinator consult. A1c 7.6% on 08/04/22.   Last visit with cardiologist was with Allena Katz, NP on 03/10/22 at [facility] for hospital follow-up.  Overall fatigue but no chest pain or significant dyspnea.  Weight stable with dialysis.  Compliant with medications. Coronary anatomy stable by 01/01/2021 LHC, no target for intervention.  On Eliquis, so not on ASA.  Consider PT for deconditioning.  49-month follow-up planned.  Last visit with PCP Shelda Pal, DO was on 08/25/22 to discuss need for home hospital bed, but previous in-person visit 08/14/22 for preoperative evaluation. He wrote, "5.4% risk of major cardiac even based on revised Goldman criteria. He is medically optimized despite this. Will stop Eliquis 2 days  prior to surgery.  Last A1c was 7.6 on 08/04/22 with endo team." He reported instructions to hold Eliquis for 3 days prior to surgery.   Reviewed available information with anesthesiologist Oleta Mouse, MD.   UPDATE 08/31/22 9:46 AM:  Cardiology input also received from Dr. Aundra Dubin.  Per notation by Kevan Rosebush, RN on 03/20/22, "Per Dr Aundra Dubin: pt moderate risk, can hold Eliquis 3 days prior."  VS: BP (!) 118/52   Pulse 69   Temp 36.5 C (Oral)   Resp 18   Ht 5\' 5"  (1.651 m)   Wt 60 kg   SpO2 99%   BMI 22.00 kg/m    PROVIDERS: Shelda Pal, DO is PCP.  Last visit 08/25/2022.  He is aware of surgery plans. Loralie Champagne, MD is cardiologist Corliss Parish, MD is nephrologist Ellouise Newer, MD is neurologist. Last visit 07/02/22. He had a seizure-like episode in 07/2021 in the setting of severe metabolic derangements.  MRI studies, routine EEGs, and prolonged 47-hour EEG were normal.  Episode appeared more metabolic related, so Levetiracetam discontinued. Six month follow-up planned. Shamleffer, Mammie Lorenzo, MD is endocrinologist Larey Days, MD is pulmonologist.Last visit 01/20/22 for follow-up DOE, likely type factorial related to relative deconditioning, intermittent volume overload between dialysis sessions, CHF, and chronic anemia. PFTs "normal." CT showed no acute abnormality. Stiolto had been started and he felt symptoms overall improved. Six month follow-up recommended.    LABS: He is a dialysis patient so he will get i-STAT labs on the day of surgery.  Currently most recent results in [facility] include:  Lab Results  Component Value Date   WBC 4.8 08/14/2022   HGB 11.5 (L) 08/14/2022  HCT 33.0 (L) 08/14/2022   PLT 158 08/14/2022   GLUCOSE 164 (H) 08/14/2022   ALT 7 04/15/2022   AST 29 04/15/2022   NA 138 08/14/2022   K 4.2 08/14/2022   CL 92 (L) 08/14/2022   CREATININE 3.86 (H) 08/14/2022   BUN 21 08/14/2022   CO2 36 (H) 08/14/2022   TSH 2.813  02/21/2022   INR 1.0 04/15/2022   HGBA1C 7.6 (A) 08/04/2022    OTHER: Ambulatory EEG 06/12/22: IMPRESSION:  This 47-hour ambulatory video EEG study is normal.  CLINICAL CORRELATION:  A normal EEG does not rule out a clinical diagnosis of epilepsy. Typical events were not captured. If further clinical questions remain, inpatient video EEG monitoring may be helpful.  Overnight Pulse Oximetry 10/28/21-10/29/21:  High SpO2 98% Low SpO2 85% Time < 88% 2.1 minutes  Time < 89% 5.8 minutes Based on the overnight test results patient does not qualify for nocturnal oxygen per Medicare guidelines.Marland KitchenMarland KitchenDue to the number of oxygen desaturation events (67 events per hour), it appears that he may have a sleep breathing disorder and may benefit from a sleep test for a more accurate assessment.  PFTs 10/23/21: FVC 3.25 (94%), post 3.31 (95%). FEV1 2.50 (91%) post 2.54 (93%). DLCO unc/cor 11.29 (44%) Per Dr. Silas Flood, "normal spirometry, no bronchodilator spots, lung volumes within normal limits, DLCO severely reduced but likely corrects to near normal when corrected for hemoglobin as he has history of anemia most recent hemoglobin 8"  IMAGES: 1V CXR 04/16/22: FINDINGS: - Since the August 2nd exam the catheter has been advanced to the distal superior vena cava area. This catheter enters via RIGHT IJ approach. - EKG leads project over the chest. - Post median sternotomy for CABG. No lobar consolidation. No pneumothorax or pleural effusion. No sign of pulmonary edema. - On limited assessment no acute skeletal process. Signs of LEFT axillary vascular stenting as before. IMPRESSION: Post exchange and or advancement of the RIGHT IJ dialysis catheter, tip in the area of the distal superior vena cava.  MRI L-spine 09/27/21: MPRESSION: 1. Severe diffuse lumbar spine spondylosis as described above. 2.  No acute osseous injury of the lumbar spine.   EKG: 04/15/22: Sinus rhythm Right bundle branch block Repol abnrm  suggests ischemia, lateral leads no sig change from previous Confirmed by Charlesetta Shanks [phone]) on 04/15/2022 12:06:28 PM   CV: US Carotid 03/12/22: Summary:  - Right Carotid: Velocities in the right ICA are consistent with a 1-39% stenosis. Non-hemodynamically significant plaque <50% noted in the CCA. The ECA appears >50% stenosed.  - Left Carotid: Velocities in the left ICA are consistent with a 1-39% stenosis. Non-hemodynamically significant plaque <50% noted in the CCA.  - Vertebrals: Left vertebral artery demonstrates antegrade flow. Right vertebral artery demonstrates high resistant flow. Dampened, high resistant waveforms in the right vertebral artery.  - Subclavians: Right subclavian artery was stenotic. Right subclavian artery flow was disturbed. Elevated velocities and low resistant waveforms in the left subclavian artery secondary to left arm AVF.    Echo 07/24/21: IMPRESSIONS   1. Hypokinesis of the basal inferior wall with overall preserved LV  systolic function.   2. Left ventricular ejection fraction, by estimation, is 60 to 65%. The  left ventricle has normal function. The left ventricle demonstrates  regional wall motion abnormalities (see scoring diagram/findings for  description). Left ventricular diastolic  parameters are consistent with Grade II diastolic dysfunction  (pseudonormalization). Elevated left atrial pressure.   3. Right ventricular systolic function is normal. The  right ventricular  size is normal. Tricuspid regurgitation signal is inadequate for assessing  PA pressure.   4. Left atrial size was mildly dilated.   5. The mitral valve is normal in structure. Trivial mitral valve  regurgitation. No evidence of mitral stenosis.   6. The aortic valve is tricuspid. Aortic valve regurgitation is trivial.  Mild aortic valve sclerosis is present, with no evidence of aortic valve  stenosis.   7. The inferior vena cava is normal in size with greater than 50%   respiratory variability, suggesting right atrial pressure of 3 mmHg.    RHC/LHC 01/03/21: Hemodynamics (mmHg) RA mean 1 RV 27/1 PA 26/7, mean 14 PCWP mean 3 LV 158/7 AO 157/51  Oxygen saturations: PA 79% AO 100%  Cardiac Output (Fick) 7.47  Cardiac Index (Fick) 4.43   Mid LM to Dist LM lesion is 30% stenosed. Prox LAD lesion is 95% stenosed. Ost 1st Diag to 1st Diag lesion is 90% stenosed. Prox Cx to Mid Cx lesion is 85% stenosed. 1st Mrg lesion is 50% stenosed. This lesion was previously treated. Prox RCA to Mid RCA lesion is 100% stenosed. Right Posterior AV Artery filled by collaterals from 1st Mrg. 2nd Right PL branch filled by collaterals from 2rd Sept. SVG to Dist RCA. Origin to Prox Graft lesion is 100% stenosed. SVG to 1st Mrg. Origin to Prox Graft lesion is 100% stenosed. The lesion was previously treated using DES 03/30/18.  LIMA to Mid LAD. LIMA is normal in caliber. Insertion lesion is 50% stenosed.  1. Low filling pressures.  2. Coronary and graft anatomy unchanged from prior [04/17/19].  LIMA-LAD remains patent, large high OM1 remains patent s/p prior PCI, and both SVGs as well as the RCA are occluded.    I suspect his symptoms were most likely due to CHF from inadequate dialysis.  He had HD 2 days in a row with significant fluid removed, now feels good and filling pressures are low.  - "No target for intervention."      Past Medical History:  Diagnosis Date   Arthritis    hands   CAD (coronary artery disease) 10/30/2016   NSTEMI 9/17 with CABG x 3 (LIMA-LAD, SVG-OM, SVG-RCA).   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   Dyspnea    with exertion   ESRD on hemodialysis ([place])    "MWF; [place]" (03/30/2018)   Gastroesophageal reflux disease 08/18/2016   Hyperlipidemia    Hypertension    Ischemic rest  pain of lower extremity 02/06/2015   Keratoma 02/27/2015   Metatarsal deformity 02/27/2015   PAF (paroxysmal atrial fibrillation) ([facility])    Pronation deformity of both feet 02/27/2015    Past Surgical History:  Procedure Laterality Date   A/V FISTULAGRAM Left 03/06/2021   Procedure: A/V FISTULAGRAM;  Surgeon: Marty Heck, MD;  Location: [place] CV LAB;  Service: Cardiovascular;  Laterality: Left;   A/V FISTULAGRAM Left 06/02/2022   Procedure: A/V Fistulagram;  Surgeon: Serafina Mitchell, MD;  Location: [place] CV LAB;  Service: Cardiovascular;  Laterality: Left;   AV FISTULA PLACEMENT Left 06/22/2016   Procedure: ARTERIOVENOUS (AV) FISTULA CREATION LEFT UPPER ARM;  Surgeon: Rosetta Posner, MD;  Location: [place];  Service: Vascular;  Laterality: Left;   BIOPSY  12/19/2020   Procedure:  BIOPSY;  Surgeon: Ladene Artist, MD;  Location: [facility] ENDOSCOPY;  Service: Endoscopy;;   CARDIAC CATHETERIZATION N/A 05/31/2016   Procedure: Left Heart Cath and Coronary Angiography;  Surgeon: Jettie Booze, MD;  Location: [place] CV LAB;  Service: Cardiovascular;  Laterality: N/A;   CARDIAC CATHETERIZATION N/A 05/31/2016   Procedure: Right Heart Cath;  Surgeon: Jettie Booze, MD;  Location: [place] CV LAB;  Service: Cardiovascular;  Laterality: N/A;   CARDIAC CATHETERIZATION N/A 05/31/2016   Procedure: IABP Insertion;  Surgeon: Jettie Booze, MD;  Location: [place] CV LAB;  Service: Cardiovascular;  Laterality: N/A;   COLONOSCOPY WITH PROPOFOL N/A 12/20/2020   Procedure: COLONOSCOPY WITH PROPOFOL;  Surgeon: Mauri Pole, MD;  Location: [place] ENDOSCOPY;  Service: Endoscopy;  Laterality: N/A;   CORONARY ARTERY BYPASS GRAFT N/A 06/05/2016   Procedure: CORONARY ARTERY BYPASS GRAFTING (CABG) x3 LIMA to LAD -SVG to OM -SVG to RCA;  Surgeon: Ivin Poot, MD;  Location: [place];  Service: Open Heart Surgery;  Laterality: N/A;   CORONARY STENT INTERVENTION N/A 07/07/2017    Procedure: CORONARY STENT INTERVENTION;  Surgeon: Wellington Hampshire, MD;  Location: [place] CV LAB;  Service: Cardiovascular;  Laterality: N/A;   CORONARY STENT INTERVENTION N/A 03/30/2018   Procedure: CORONARY STENT INTERVENTION;  Surgeon: Martinique, Peter M, MD;  Location: [place] CV LAB;  Service: Cardiovascular;  Laterality: N/A;   ESOPHAGOGASTRODUODENOSCOPY (EGD) WITH PROPOFOL N/A 12/19/2020   Procedure: ESOPHAGOGASTRODUODENOSCOPY (EGD) WITH PROPOFOL;  Surgeon: Ladene Artist, MD;  Location[facility] ENDOSCOPY;  Service: Endoscopy;  Laterality: N/A;   EXCHANGE OF A DIALYSIS CATHETER Right 04/16/2022   Procedure: EXCHANGE OF A DIALYSIS CATHETER;  Surgeon: Cherre Robins, MD;  Location: [place];  Service: Vascular;  Laterality: Right;   GIVENS CAPSULE STUDY N/A 12/20/2020   Procedure: GIVENS CAPSULE STUDY;  Surgeon: Mauri Pole, MD;  Location: [place] ENDOSCOPY;  Service: Endoscopy;  Laterality: N/A;   INSERTION OF DIALYSIS CATHETER N/A 06/05/2016   Procedure: INSERTION OF DIALYSIS/trialysis CATHETER;  Surgeon: Ivin Poot, MD;  Location: [place];  Service: Vascular;  Laterality: N/A;   INSERTION OF DIALYSIS CATHETER Right 06/13/2016   Procedure: INSERTION OF DIALYSIS CATHETER RIGHT INTERNAL JUGULAR;  Surgeon: Conrad Ariton, MD;  Location: [place];  Service: Vascular;  Laterality: Right;   INSERTION OF DIALYSIS CATHETER Right 09/07/2019   Procedure: Insertion Of Dialysis Catheter;  Surgeon: Rosetta Posner, MD;  Location: [facility] OR;  Service: Vascular;  Laterality: Right;   INSERTION OF DIALYSIS CATHETER N/A 04/09/2022   Procedure: INSERTION OF TUNNELED DIALYSIS CATHETER;  Surgeon: Cherre Robins, MD;  Location: [place];  Service: Vascular;  Laterality: N/A;   INTRAOPERATIVE TRANSESOPHAGEAL ECHOCARDIOGRAM N/A 06/05/2016   Procedure: INTRAOPERATIVE TRANSESOPHAGEAL ECHOCARDIOGRAM;  Surgeon: Ivin Poot, MD;  Location: [place];  Service: Open Heart Surgery;  Laterality: N/A;   LEFT HEART CATH AND CORS/GRAFTS  ANGIOGRAPHY N/A 11/03/2016   Procedure: Left Heart Cath and Cors/Grafts Angiography;  Surgeon: Troy Sine, MD;  Location: [place] CV LAB;  Service: Cardiovascular;  Laterality: N/A;   LEFT HEART CATH AND CORS/GRAFTS ANGIOGRAPHY N/A 07/07/2017   Procedure: LEFT HEART CATH AND CORS/GRAFTS ANGIOGRAPHY;  Surgeon: Larey Dresser, MD;  Location: [place] CV LAB;  Service: Cardiovascular;  Laterality: N/A;   LEFT HEART CATH AND CORS/GRAFTS ANGIOGRAPHY N/A 03/30/2018   Procedure: LEFT HEART CATH AND CORS/GRAFTS ANGIOGRAPHY;  Surgeon: Martinique, Peter M, MD;  Location: [place] CV LAB;  Service: Cardiovascular;  Laterality: N/A;   LEFT HEART CATH AND CORS/GRAFTS ANGIOGRAPHY N/A 04/17/2019   Procedure: LEFT HEART CATH AND CORS/GRAFTS ANGIOGRAPHY;  Surgeon: Larey Dresser, MD;  Location: [place] CV LAB;  Service: Cardiovascular;  Laterality: N/A;   PERIPHERAL VASCULAR BALLOON ANGIOPLASTY Left 06/02/2022   Procedure: PERIPHERAL VASCULAR BALLOON ANGIOPLASTY;  Surgeon: Serafina Mitchell, MD;  Location: [place] CV LAB;  Service: Cardiovascular;  Laterality: Left;   PERIPHERAL VASCULAR INTERVENTION Left 03/06/2021   Procedure: PERIPHERAL VASCULAR INTERVENTION;  Surgeon: Marty Heck, MD;  Location: [place] CV LAB;  Service: Cardiovascular;  Laterality: Left;   POLYPECTOMY  12/20/2020   Procedure: POLYPECTOMY;  Surgeon: Mauri Pole, MD;  Location: [place] ENDOSCOPY;  Service: Endoscopy;;   REVISION OF ARTERIOVENOUS GORETEX GRAFT Left 44/11/4740   Procedure: PLICATION OF ANEURYSM OF ARTERIOVENOUS FISTULA  LEFT ARM;  Surgeon: Rosetta Posner, MD;  Location: [place];  Service: Vascular;  Laterality: Left;   REVISON OF ARTERIOVENOUS FISTULA Left 09/07/2019   Procedure: REVISON OF LEFT UPPER ARM ARTERIOVENOUS FISTULA;  Surgeon: Rosetta Posner, MD;  Location: [place];  Service: Vascular;  Laterality: Left;   REVISON OF ARTERIOVENOUS FISTULA Left 04/09/2022   Procedure: LEFT ARM FISTULA REVISION;   Surgeon: Cherre Robins, MD;  Location: [place];  Service: Vascular;  Laterality: Left;  PERIPHERAL NERVE BLOCK   REVISON OF ARTERIOVENOUS FISTULA Left 04/16/2022   Procedure: REVISON OF ARTERIOVENOUS FISTULA;  Surgeon: Cherre Robins, MD;  Location: [place];  Service: Vascular;  Laterality: Left;   RIGHT/LEFT HEART CATH AND CORONARY ANGIOGRAPHY N/A 01/03/2021   Procedure: RIGHT/LEFT HEART CATH AND CORONARY ANGIOGRAPHY;  Surgeon: Larey Dresser, MD;  Location: [place] CV LAB;  Service: Cardiovascular;  Laterality: N/A;    MEDICATIONS:  apixaban (ELIQUIS) 5 MG TABS tablet   atorvastatin (LIPITOR) 80 MG tablet   calcium acetate (PHOSLO) 667 MG capsule   carvedilol (COREG) 6.25 MG tablet   Continuous Blood Gluc Sensor (FREESTYLE LIBRE 2 SENSOR) MISC   Continuous Blood Gluc Sensor (GUARDIAN SENSOR 3) MISC   Continuous Blood Gluc Transmit (GUARDIAN LINK 3 TRANSMITTER) MISC   Darbepoetin Alfa (ARANESP, ALBUMIN FREE, IJ)   diclofenac Sodium (VOLTAREN) 1 % GEL   ezetimibe (ZETIA) 10 MG tablet   glucose blood (ONETOUCH VERIO) test strip   heparin 1000 unit/mL SOLN injection   hydrALAZINE (APRESOLINE) 100 MG tablet   Insulin Lispro-aabc (LYUMJEV) 100 UNIT/ML SOLN   Insulin Pen Needle (PEN NEEDLES) 32G X 6 MM MISC   isosorbide mononitrate (IMDUR) 60 MG 24 hr tablet   lidocaine (LIDODERM) 5 %   megestrol (MEGACE ES) 625 MG/5ML suspension   Methoxy PEG-Epoetin Beta (MIRCERA IJ)   pantoprazole (PROTONIX) 40 MG tablet   sertraline (ZOLOFT) 50 MG tablet   Sodium Zirconium Cyclosilicate (LOKELMA PO)   Tiotropium Bromide-Olodaterol (STIOLTO RESPIMAT) 2.5-2.5 MCG/ACT AERS   traMADol (ULTRAM) 50 MG tablet   influenza vaccine adjuvanted (FLUAD QUADRIVALENT) 0.5 ML injection   No current facility-administered medications for this encounter.    Myra Gianotti, PA-C Surgical Short Stay/Anesthesiology [facility] Phone [phone] [facility] Phone [phone] 08/28/2022 6:44 PM

## 2022-09-01 ENCOUNTER — Encounter (HOSPITAL_COMMUNITY): Payer: Self-pay | Admitting: Neurosurgery

## 2022-09-01 ENCOUNTER — Ambulatory Visit (HOSPITAL_COMMUNITY)
Admission: RE | Disposition: A | Discharge status: Home Health | Payer: Self-pay | Source: Home / Self Care | Attending: Neurosurgery

## 2022-09-01 ENCOUNTER — Ambulatory Visit (HOSPITAL_BASED_OUTPATIENT_CLINIC_OR_DEPARTMENT_OTHER): Payer: Medicare Other | Admitting: Anesthesiology

## 2022-09-01 ENCOUNTER — Other Ambulatory Visit: Payer: Self-pay

## 2022-09-01 ENCOUNTER — Observation Stay (HOSPITAL_COMMUNITY)
Admission: RE | Admit: 2022-09-01 | Discharge: 2022-09-02 | Disposition: A | Discharge status: Home Health | Payer: Medicare Other | Attending: Neurosurgery | Admitting: Neurosurgery

## 2022-09-01 ENCOUNTER — Ambulatory Visit (HOSPITAL_COMMUNITY): Payer: Medicare Other | Admitting: Vascular Surgery

## 2022-09-01 ENCOUNTER — Ambulatory Visit (HOSPITAL_COMMUNITY): Payer: Medicare Other

## 2022-09-01 DIAGNOSIS — Z951 Presence of aortocoronary bypass graft: Secondary | ICD-10-CM | POA: Diagnosis not present

## 2022-09-01 DIAGNOSIS — Z955 Presence of coronary angioplasty implant and graft: Secondary | ICD-10-CM | POA: Insufficient documentation

## 2022-09-01 DIAGNOSIS — Z992 Dependence on renal dialysis: Secondary | ICD-10-CM | POA: Insufficient documentation

## 2022-09-01 DIAGNOSIS — I509 Heart failure, unspecified: Secondary | ICD-10-CM | POA: Diagnosis not present

## 2022-09-01 DIAGNOSIS — Z87891 Personal history of nicotine dependence: Secondary | ICD-10-CM | POA: Diagnosis not present

## 2022-09-01 DIAGNOSIS — I2510 Atherosclerotic heart disease of native coronary artery without angina pectoris: Secondary | ICD-10-CM | POA: Diagnosis not present

## 2022-09-01 DIAGNOSIS — M48062 Spinal stenosis, lumbar region with neurogenic claudication: Secondary | ICD-10-CM | POA: Diagnosis not present

## 2022-09-01 DIAGNOSIS — I480 Paroxysmal atrial fibrillation: Secondary | ICD-10-CM | POA: Insufficient documentation

## 2022-09-01 DIAGNOSIS — N186 End stage renal disease: Secondary | ICD-10-CM | POA: Diagnosis not present

## 2022-09-01 DIAGNOSIS — M48061 Spinal stenosis, lumbar region without neurogenic claudication: Secondary | ICD-10-CM

## 2022-09-01 DIAGNOSIS — J449 Chronic obstructive pulmonary disease, unspecified: Secondary | ICD-10-CM

## 2022-09-01 DIAGNOSIS — E1122 Type 2 diabetes mellitus with diabetic chronic kidney disease: Secondary | ICD-10-CM | POA: Insufficient documentation

## 2022-09-01 DIAGNOSIS — I4891 Unspecified atrial fibrillation: Secondary | ICD-10-CM | POA: Diagnosis not present

## 2022-09-01 DIAGNOSIS — I132 Hypertensive heart and chronic kidney disease with heart failure and with stage 5 chronic kidney disease, or end stage renal disease: Secondary | ICD-10-CM | POA: Insufficient documentation

## 2022-09-01 DIAGNOSIS — Z79899 Other long term (current) drug therapy: Secondary | ICD-10-CM | POA: Insufficient documentation

## 2022-09-01 HISTORY — PX: LUMBAR LAMINECTOMY/DECOMPRESSION MICRODISCECTOMY: SHX5026

## 2022-09-01 LAB — POCT I-STAT, CHEM 8
BUN: 37 mg/dL — ABNORMAL HIGH (ref 8–23)
Calcium, Ion: 0.95 mmol/L — ABNORMAL LOW (ref 1.15–1.40)
Chloride: 94 mmol/L — ABNORMAL LOW (ref 98–111)
Creatinine, Ser: 7 mg/dL — ABNORMAL HIGH (ref 0.61–1.24)
Glucose, Bld: 188 mg/dL — ABNORMAL HIGH (ref 70–99)
HCT: 29 % — ABNORMAL LOW (ref 39.0–52.0)
Hemoglobin: 9.9 g/dL — ABNORMAL LOW (ref 13.0–17.0)
Potassium: 4.7 mmol/L (ref 3.5–5.1)
Sodium: 134 mmol/L — ABNORMAL LOW (ref 135–145)
TCO2: 29 mmol/L (ref 22–32)

## 2022-09-01 LAB — GLUCOSE, CAPILLARY
Glucose-Capillary: 192 mg/dL — ABNORMAL HIGH (ref 70–99)
Glucose-Capillary: 140 mg/dL — ABNORMAL HIGH (ref 70–99)
Glucose-Capillary: 107 mg/dL — ABNORMAL HIGH (ref 70–99)
Glucose-Capillary: 258 mg/dL — ABNORMAL HIGH (ref 70–99)
Glucose-Capillary: 280 mg/dL — ABNORMAL HIGH (ref 70–99)

## 2022-09-01 SURGERY — LUMBAR LAMINECTOMY/DECOMPRESSION MICRODISCECTOMY 1 LEVEL
Anesthesia: General | Site: Back | Laterality: Bilateral

## 2022-09-01 MED ORDER — ALBUMIN HUMAN 5 % IV SOLN: INTRAVENOUS | Status: DC | PRN

## 2022-09-01 MED ORDER — CARVEDILOL 6.25 MG PO TABS
6.2500 mg | ORAL_TABLET | Freq: Two times a day (BID) | ORAL | Status: DC
  Administered 2022-09-01 – 2022-09-02 (×2): 6.25 mg via ORAL
  Filled 2022-09-01 (×3): qty 1

## 2022-09-01 MED ORDER — PROPOFOL 10 MG/ML IV BOLUS
INTRAVENOUS | Status: AC
  Filled 2022-09-01: qty 20

## 2022-09-01 MED ORDER — KETOROLAC TROMETHAMINE 30 MG/ML IJ SOLN
INTRAMUSCULAR | Status: AC
  Filled 2022-09-01: qty 1

## 2022-09-01 MED ORDER — CEFAZOLIN SODIUM-DEXTROSE 1-4 GM/50ML-% IV SOLN: 1.0000 g | INTRAVENOUS | Status: DC

## 2022-09-01 MED ORDER — EPHEDRINE SULFATE-NACL 50-0.9 MG/10ML-% IV SOSY
PREFILLED_SYRINGE | INTRAVENOUS | Status: DC | PRN
  Administered 2022-09-01: 10 mg via INTRAVENOUS
  Administered 2022-09-01: 5 mg via INTRAVENOUS
  Administered 2022-09-01: 10 mg via INTRAVENOUS

## 2022-09-01 MED ORDER — PHENYLEPHRINE HCL-NACL 20-0.9 MG/250ML-% IV SOLN
INTRAVENOUS | Status: DC | PRN
  Administered 2022-09-01: 25 ug/min via INTRAVENOUS

## 2022-09-01 MED ORDER — BUPIVACAINE HCL (PF) 0.25 % IJ SOLN
INTRAMUSCULAR | Status: AC
  Filled 2022-09-01: qty 30

## 2022-09-01 MED ORDER — PHENOL 1.4 % MT LIQD: 1.0000 | OROMUCOSAL | Status: DC | PRN

## 2022-09-01 MED ORDER — SODIUM ZIRCONIUM CYCLOSILICATE 5 G PO PACK
5.0000 g | PACK | ORAL | Status: DC
  Administered 2022-09-01: 5 g via ORAL
  Filled 2022-09-01: qty 1

## 2022-09-01 MED ORDER — HYDROCODONE-ACETAMINOPHEN 10-325 MG PO TABS
2.0000 | ORAL_TABLET | ORAL | Status: DC | PRN
  Administered 2022-09-01 – 2022-09-02 (×2): 2 via ORAL
  Filled 2022-09-01 (×2): qty 2

## 2022-09-01 MED ORDER — 0.9 % SODIUM CHLORIDE (POUR BTL) OPTIME
TOPICAL | Status: DC | PRN
  Administered 2022-09-01: 1000 mL

## 2022-09-01 MED ORDER — HYDRALAZINE HCL 50 MG PO TABS
100.0000 mg | ORAL_TABLET | Freq: Three times a day (TID) | ORAL | Status: DC
  Administered 2022-09-01 (×2): 100 mg via ORAL
  Filled 2022-09-01 (×4): qty 2

## 2022-09-01 MED ORDER — ACETAMINOPHEN 325 MG PO TABS: 650.0000 mg | ORAL_TABLET | ORAL | Status: DC | PRN

## 2022-09-01 MED ORDER — HYDROMORPHONE HCL 1 MG/ML IJ SOLN: 1.0000 mg | INTRAMUSCULAR | Status: DC | PRN

## 2022-09-01 MED ORDER — INSULIN ASPART 100 UNIT/ML IJ SOLN
0.0000 [IU] | Freq: Three times a day (TID) | INTRAMUSCULAR | Status: DC
  Administered 2022-09-02: 1 [IU] via SUBCUTANEOUS
  Administered 2022-09-02: 4 [IU] via SUBCUTANEOUS

## 2022-09-01 MED ORDER — DEXAMETHASONE SODIUM PHOSPHATE 10 MG/ML IJ SOLN
INTRAMUSCULAR | Status: DC | PRN
  Administered 2022-09-01: 4 mg via INTRAVENOUS

## 2022-09-01 MED ORDER — KETOROLAC TROMETHAMINE 30 MG/ML IJ SOLN
INTRAMUSCULAR | Status: DC | PRN
  Administered 2022-09-01: 30 mg via INTRAVENOUS

## 2022-09-01 MED ORDER — MEGESTROL ACETATE 400 MG/10ML PO SUSP
400.0000 mg | Freq: Every day | ORAL | Status: DC
  Administered 2022-09-01 – 2022-09-02 (×2): 400 mg via ORAL
  Filled 2022-09-01 (×2): qty 10

## 2022-09-01 MED ORDER — MIDAZOLAM HCL 2 MG/2ML IJ SOLN
INTRAMUSCULAR | Status: AC
  Filled 2022-09-01: qty 2

## 2022-09-01 MED ORDER — BUPIVACAINE HCL (PF) 0.25 % IJ SOLN
INTRAMUSCULAR | Status: DC | PRN
  Administered 2022-09-01: 20 mL

## 2022-09-01 MED ORDER — MIDAZOLAM HCL 2 MG/2ML IJ SOLN
INTRAMUSCULAR | Status: DC | PRN
  Administered 2022-09-01: 1 mg via INTRAVENOUS

## 2022-09-01 MED ORDER — INSULIN ASPART 100 UNIT/ML IJ SOLN
0.0000 [IU] | INTRAMUSCULAR | Status: DC | PRN
  Administered 2022-09-01: 2 [IU] via SUBCUTANEOUS
  Filled 2022-09-01: qty 1

## 2022-09-01 MED ORDER — HYDROCODONE-ACETAMINOPHEN 5-325 MG PO TABS
1.0000 | ORAL_TABLET | ORAL | Status: DC | PRN
  Administered 2022-09-02: 1 via ORAL
  Filled 2022-09-01: qty 1

## 2022-09-01 MED ORDER — SODIUM CHLORIDE 0.9% FLUSH: 3.0000 mL | INTRAVENOUS | Status: DC | PRN

## 2022-09-01 MED ORDER — DEXAMETHASONE SODIUM PHOSPHATE 10 MG/ML IJ SOLN
INTRAMUSCULAR | Status: AC
  Filled 2022-09-01: qty 1

## 2022-09-01 MED ORDER — ROCURONIUM BROMIDE 10 MG/ML (PF) SYRINGE
PREFILLED_SYRINGE | INTRAVENOUS | Status: AC
  Filled 2022-09-01: qty 10

## 2022-09-01 MED ORDER — ONDANSETRON HCL 4 MG/2ML IJ SOLN
INTRAMUSCULAR | Status: AC
  Filled 2022-09-01: qty 2

## 2022-09-01 MED ORDER — CYCLOBENZAPRINE HCL 10 MG PO TABS: 10.0000 mg | ORAL_TABLET | Freq: Three times a day (TID) | ORAL | Status: DC | PRN

## 2022-09-01 MED ORDER — FENTANYL CITRATE (PF) 250 MCG/5ML IJ SOLN
INTRAMUSCULAR | Status: DC | PRN
  Administered 2022-09-01: 50 ug via INTRAVENOUS
  Administered 2022-09-01: 100 ug via INTRAVENOUS

## 2022-09-01 MED ORDER — CHLORHEXIDINE GLUCONATE CLOTH 2 % EX PADS: 6.0000 | MEDICATED_PAD | Freq: Once | CUTANEOUS | Status: DC

## 2022-09-01 MED ORDER — CEFAZOLIN SODIUM-DEXTROSE 2-4 GM/100ML-% IV SOLN
2.0000 g | INTRAVENOUS | Status: AC
  Administered 2022-09-01: 2 g via INTRAVENOUS
  Filled 2022-09-01: qty 100

## 2022-09-01 MED ORDER — ATORVASTATIN CALCIUM 80 MG PO TABS
80.0000 mg | ORAL_TABLET | Freq: Every day | ORAL | Status: DC
  Administered 2022-09-02: 80 mg via ORAL
  Filled 2022-09-01: qty 1

## 2022-09-01 MED ORDER — EZETIMIBE 10 MG PO TABS
10.0000 mg | ORAL_TABLET | Freq: Every day | ORAL | Status: DC
  Administered 2022-09-02: 10 mg via ORAL
  Filled 2022-09-01: qty 1

## 2022-09-01 MED ORDER — MENTHOL 3 MG MT LOZG: 1.0000 | LOZENGE | OROMUCOSAL | Status: DC | PRN

## 2022-09-01 MED ORDER — SODIUM CHLORIDE 0.9 % IV SOLN: INTRAVENOUS | Status: DC

## 2022-09-01 MED ORDER — PANTOPRAZOLE SODIUM 40 MG PO TBEC
40.0000 mg | DELAYED_RELEASE_TABLET | Freq: Two times a day (BID) | ORAL | Status: DC
  Administered 2022-09-01 – 2022-09-02 (×2): 40 mg via ORAL
  Filled 2022-09-01 (×3): qty 1

## 2022-09-01 MED ORDER — INSULIN ASPART 100 UNIT/ML IJ SOLN
0.0000 [IU] | Freq: Three times a day (TID) | INTRAMUSCULAR | Status: DC
  Administered 2022-09-01: 8 [IU] via SUBCUTANEOUS

## 2022-09-01 MED ORDER — PROPOFOL 10 MG/ML IV BOLUS
INTRAVENOUS | Status: DC | PRN
  Administered 2022-09-01: 170 mg via INTRAVENOUS

## 2022-09-01 MED ORDER — ORAL CARE MOUTH RINSE: 15.0000 mL | Freq: Once | OROMUCOSAL | Status: AC

## 2022-09-01 MED ORDER — SODIUM CHLORIDE 0.9% FLUSH
3.0000 mL | Freq: Two times a day (BID) | INTRAVENOUS | Status: DC
  Administered 2022-09-01: 3 mL via INTRAVENOUS

## 2022-09-01 MED ORDER — KETOROLAC TROMETHAMINE 15 MG/ML IJ SOLN
30.0000 mg | Freq: Four times a day (QID) | INTRAMUSCULAR | Status: AC
  Administered 2022-09-01 – 2022-09-02 (×3): 30 mg via INTRAVENOUS
  Filled 2022-09-01 (×3): qty 2

## 2022-09-01 MED ORDER — LIDOCAINE 2% (20 MG/ML) 5 ML SYRINGE
INTRAMUSCULAR | Status: DC | PRN
  Administered 2022-09-01: 60 mg via INTRAVENOUS

## 2022-09-01 MED ORDER — SODIUM CHLORIDE 0.9 % IV SOLN: 250.0000 mL | INTRAVENOUS | Status: DC

## 2022-09-01 MED ORDER — THROMBIN 20000 UNITS EX KIT: PACK | CUTANEOUS | Status: DC | PRN

## 2022-09-01 MED ORDER — FENTANYL CITRATE (PF) 250 MCG/5ML IJ SOLN
INTRAMUSCULAR | Status: AC
  Filled 2022-09-01: qty 5

## 2022-09-01 MED ORDER — ROCURONIUM BROMIDE 10 MG/ML (PF) SYRINGE
PREFILLED_SYRINGE | INTRAVENOUS | Status: DC | PRN
  Administered 2022-09-01: 60 mg via INTRAVENOUS

## 2022-09-01 MED ORDER — FENTANYL CITRATE (PF) 100 MCG/2ML IJ SOLN: 25.0000 ug | INTRAMUSCULAR | Status: DC | PRN

## 2022-09-01 MED ORDER — SERTRALINE HCL 50 MG PO TABS
50.0000 mg | ORAL_TABLET | Freq: Every day | ORAL | Status: DC
  Administered 2022-09-01 – 2022-09-02 (×2): 50 mg via ORAL
  Filled 2022-09-01 (×2): qty 1

## 2022-09-01 MED ORDER — ISOSORBIDE MONONITRATE ER 60 MG PO TB24
90.0000 mg | ORAL_TABLET | Freq: Every day | ORAL | Status: DC
  Administered 2022-09-01 – 2022-09-02 (×2): 90 mg via ORAL
  Filled 2022-09-01 (×2): qty 2

## 2022-09-01 MED ORDER — OXYCODONE HCL 5 MG PO TABS: 5.0000 mg | ORAL_TABLET | Freq: Once | ORAL | Status: DC | PRN

## 2022-09-01 MED ORDER — ACETAMINOPHEN 650 MG RE SUPP: 650.0000 mg | RECTAL | Status: DC | PRN

## 2022-09-01 MED ORDER — ONDANSETRON HCL 4 MG/2ML IJ SOLN
INTRAMUSCULAR | Status: DC | PRN
  Administered 2022-09-01: 4 mg via INTRAVENOUS

## 2022-09-01 MED ORDER — UMECLIDINIUM BROMIDE 62.5 MCG/ACT IN AEPB
1.0000 | INHALATION_SPRAY | Freq: Every day | RESPIRATORY_TRACT | Status: DC
  Administered 2022-09-02: 1 via RESPIRATORY_TRACT
  Filled 2022-09-01: qty 7

## 2022-09-01 MED ORDER — OXYCODONE HCL 5 MG/5ML PO SOLN: 5.0000 mg | Freq: Once | ORAL | Status: DC | PRN

## 2022-09-01 MED ORDER — INSULIN ASPART 100 UNIT/ML IJ SOLN
0.0000 [IU] | Freq: Every day | INTRAMUSCULAR | Status: DC
  Administered 2022-09-01: 3 [IU] via SUBCUTANEOUS

## 2022-09-01 MED ORDER — LIDOCAINE 2% (20 MG/ML) 5 ML SYRINGE
INTRAMUSCULAR | Status: AC
  Filled 2022-09-01: qty 5

## 2022-09-01 MED ORDER — CHLORHEXIDINE GLUCONATE 0.12 % MT SOLN
15.0000 mL | Freq: Once | OROMUCOSAL | Status: AC
  Administered 2022-09-01: 15 mL via OROMUCOSAL
  Filled 2022-09-01: qty 15

## 2022-09-01 MED ORDER — CALCIUM ACETATE (PHOS BINDER) 667 MG PO CAPS
667.0000 mg | ORAL_CAPSULE | Freq: Three times a day (TID) | ORAL | Status: DC
  Administered 2022-09-01 – 2022-09-02 (×3): 667 mg via ORAL
  Filled 2022-09-01 (×5): qty 1

## 2022-09-01 MED ORDER — THROMBIN 20000 UNITS EX SOLR
CUTANEOUS | Status: AC
  Filled 2022-09-01: qty 20000

## 2022-09-01 MED ORDER — SUGAMMADEX SODIUM 200 MG/2ML IV SOLN
INTRAVENOUS | Status: DC | PRN
  Administered 2022-09-01: 200 mg via INTRAVENOUS

## 2022-09-01 MED ORDER — ARFORMOTEROL TARTRATE 15 MCG/2ML IN NEBU
15.0000 ug | INHALATION_SOLUTION | Freq: Two times a day (BID) | RESPIRATORY_TRACT | Status: DC
  Administered 2022-09-02: 15 ug via RESPIRATORY_TRACT
  Filled 2022-09-01 (×4): qty 2

## 2022-09-01 SURGICAL SUPPLY — 44 items
BAG COUNTER SPONGE SURGICOUNT (BAG) ×1 IMPLANT
BAG DECANTER FOR FLEXI CONT (MISCELLANEOUS) ×1 IMPLANT
BAND RUBBER #18 3X1/16 STRL (MISCELLANEOUS) ×2 IMPLANT
BENZOIN TINCTURE PRP APPL 2/3 (GAUZE/BANDAGES/DRESSINGS) ×1 IMPLANT
BLADE CLIPPER SURG (BLADE) IMPLANT
BUR CUTTER 7.0 ROUND (BURR) ×1 IMPLANT
CANISTER SUCT 3000ML PPV (MISCELLANEOUS) ×1 IMPLANT
DERMABOND ADVANCED .7 DNX12 (GAUZE/BANDAGES/DRESSINGS) ×1 IMPLANT
DRAPE HALF SHEET 40X57 (DRAPES) IMPLANT
DRAPE LAPAROTOMY 100X72X124 (DRAPES) ×1 IMPLANT
DRAPE MICROSCOPE SLANT 54X150 (MISCELLANEOUS) ×1 IMPLANT
DRAPE SURG 17X23 STRL (DRAPES) ×2 IMPLANT
DRSG OPSITE POSTOP 3X4 (GAUZE/BANDAGES/DRESSINGS) IMPLANT
DURAPREP 26ML APPLICATOR (WOUND CARE) ×1 IMPLANT
ELECT REM PT RETURN 9FT ADLT (ELECTROSURGICAL) ×1
ELECTRODE REM PT RTRN 9FT ADLT (ELECTROSURGICAL) ×1 IMPLANT
GAUZE 4X4 16PLY ~~LOC~~+RFID DBL (SPONGE) IMPLANT
GAUZE SPONGE 4X4 12PLY STRL (GAUZE/BANDAGES/DRESSINGS) ×1 IMPLANT
GLOVE BIO SURGEON STRL SZ 6.5 (GLOVE) ×1 IMPLANT
GLOVE BIOGEL PI IND STRL 6.5 (GLOVE) ×1 IMPLANT
GLOVE ECLIPSE 9.0 STRL (GLOVE) ×1 IMPLANT
GLOVE EXAM NITRILE XL STR (GLOVE) IMPLANT
GOWN STRL REUS W/ TWL LRG LVL3 (GOWN DISPOSABLE) IMPLANT
GOWN STRL REUS W/ TWL XL LVL3 (GOWN DISPOSABLE) ×1 IMPLANT
GOWN STRL REUS W/TWL 2XL LVL3 (GOWN DISPOSABLE) IMPLANT
GOWN STRL REUS W/TWL LRG LVL3 (GOWN DISPOSABLE)
GOWN STRL REUS W/TWL XL LVL3 (GOWN DISPOSABLE) ×3
KIT BASIN OR (CUSTOM PROCEDURE TRAY) ×1 IMPLANT
KIT TURNOVER KIT B (KITS) ×1 IMPLANT
NDL SPNL 22GX3.5 QUINCKE BK (NEEDLE) IMPLANT
NEEDLE HYPO 22GX1.5 SAFETY (NEEDLE) ×1 IMPLANT
NEEDLE SPNL 22GX3.5 QUINCKE BK (NEEDLE) IMPLANT
NS IRRIG 1000ML POUR BTL (IV SOLUTION) ×1 IMPLANT
PACK LAMINECTOMY NEURO (CUSTOM PROCEDURE TRAY) ×1 IMPLANT
PAD ARMBOARD 7.5X6 YLW CONV (MISCELLANEOUS) ×3 IMPLANT
SPIKE FLUID TRANSFER (MISCELLANEOUS) ×1 IMPLANT
SPONGE SURGIFOAM ABS GEL 100C (HEMOSTASIS) IMPLANT
SPONGE SURGIFOAM ABS GEL SZ50 (HEMOSTASIS) ×1 IMPLANT
STRIP CLOSURE SKIN 1/2X4 (GAUZE/BANDAGES/DRESSINGS) ×1 IMPLANT
SUT VIC AB 2-0 CT1 18 (SUTURE) ×1 IMPLANT
SUT VIC AB 3-0 SH 8-18 (SUTURE) ×1 IMPLANT
TOWEL GREEN STERILE (TOWEL DISPOSABLE) ×1 IMPLANT
TOWEL GREEN STERILE FF (TOWEL DISPOSABLE) ×1 IMPLANT
WATER STERILE IRR 1000ML POUR (IV SOLUTION) ×1 IMPLANT

## 2022-09-01 NOTE — Anesthesia Preprocedure Evaluation (Addendum)
Anesthesia Evaluation  Patient identified by MRN, date of birth, ID band Patient awake    Reviewed: Allergy & Precautions, H&P , NPO status , Patient's Chart, lab work & pertinent test results  Airway Mallampati: II   Neck ROM: full    Dental   Pulmonary shortness of breath, COPD, former smoker   breath sounds clear to auscultation       Cardiovascular hypertension, + CAD, + Past MI, + Cardiac Stents, + CABG, + Peripheral Vascular Disease and +CHF  + dysrhythmias Atrial Fibrillation  Rhythm:regular Rate:Normal     Neuro/Psych  PSYCHIATRIC DISORDERS  Depression     Neuromuscular disease    GI/Hepatic ,GERD  ,,  Endo/Other  diabetes, Type 2    Renal/GU ESRF and DialysisRenal disease     Musculoskeletal  (+) Arthritis ,    Abdominal   Peds  Hematology  (+) Blood dyscrasia, anemia   Anesthesia Other Findings   Reproductive/Obstetrics                             Anesthesia Physical Anesthesia Plan  ASA: 4  Anesthesia Plan: General   Post-op Pain Management:    Induction: Intravenous  PONV Risk Score and Plan: 2 and Ondansetron, Dexamethasone, Midazolam and Treatment may vary due to age or medical condition  Airway Management Planned: Oral ETT  Additional Equipment:   Intra-op Plan:   Post-operative Plan: Extubation in OR  Informed Consent: I have reviewed the patients History and Physical, chart, labs and discussed the procedure including the risks, benefits and alternatives for the proposed anesthesia with the patient or authorized representative who has indicated his/her understanding and acceptance.     Dental advisory given  Plan Discussed with: CRNA, Anesthesiologist and Surgeon  Anesthesia Plan Comments:        Anesthesia Quick Evaluation

## 2022-09-01 NOTE — Progress Notes (Signed)
PHARMACY NOTE:  ANTIMICROBIAL RENAL DOSAGE ADJUSTMENT  Current antimicrobial regimen includes a mismatch between antimicrobial dosage and estimated renal function.  As per policy approved by the Pharmacy & Therapeutics and Medical Executive Committees, the antimicrobial dosage will be adjusted accordingly.  Current antimicrobial dosage:  Cefazolin 1gm IV q8h x 2  Indication: surigcal prophylaxis  Renal Function:  Estimated Creatinine Clearance: 8.9 mL/min (A) (by C-G formula based on SCr of 7 mg/dL (H)). [x]      On intermittent HD, scheduled: []      On CRRT    Antimicrobial dosage has been changed to:  Cefazolin 1gm IV q24h x 1  Additional comments:  Tammala Weider A. Levada Dy, PharmD, BCPS, FNKF Clinical Pharmacist [place] Please utilize Amion for appropriate phone number to reach the unit pharmacist ([place])  09/01/2022 3:53 PM

## 2022-09-01 NOTE — Brief Op Note (Signed)
09/01/2022  1:29 PM  PATIENT:  [name]  66 y.o. male  PRE-OPERATIVE DIAGNOSIS:  Stenosis  POST-OPERATIVE DIAGNOSIS:  Stenosis  PROCEDURE:  Procedure(s): Laminectomy and Foraminotomy - bilateral - Lumbar three-Lumbar four (Bilateral)  SURGEON:  Surgeon(s) and Role:    * Earnie Larsson, MD - Primary    * Kristeen Miss, MD - Assisting  PHYSICIAN ASSISTANT:   ASSISTANTS:    ANESTHESIA:   general  EBL:  75cc   BLOOD ADMINISTERED:none  DRAINS: none   LOCAL MEDICATIONS USED:  MARCAINE     SPECIMEN:  No Specimen  DISPOSITION OF SPECIMEN:  PATHOLOGY  COUNTS:  YES  TOURNIQUET:  * No tourniquets in log *  DICTATION: .Dragon Dictation  PLAN OF CARE: Admit for overnight observation  PATIENT DISPOSITION:  PACU - hemodynamically stable.   Delay start of Pharmacological VTE agent (>24hrs) due to surgical blood loss or risk of bleeding: yes

## 2022-09-01 NOTE — Anesthesia Procedure Notes (Signed)
Procedure Name: Intubation Date/Time: 09/01/2022 12:20 PM  Performed by: Michele Rockers, CRNAPre-anesthesia Checklist: Patient identified, Patient being monitored, Timeout performed, Emergency Drugs available and Suction available Patient Re-evaluated:Patient Re-evaluated prior to induction Oxygen Delivery Method: Circle system utilized Preoxygenation: Pre-oxygenation with 100% oxygen Induction Type: IV induction Ventilation: Mask ventilation without difficulty Laryngoscope Size: Mac and 3 Grade View: Grade I Tube type: Oral Tube size: 7.5 mm Number of attempts: 2 Airway Equipment and Method: Stylet Placement Confirmation: ETT inserted through vocal cords under direct vision, positive ETCO2 and breath sounds checked- equal and bilateral Secured at: 22 cm Tube secured with: Tape Dental Injury: Teeth and Oropharynx as per pre-operative assessment  Comments: Attempted with mill 2, 2nd attempt Mac 3 successful

## 2022-09-01 NOTE — Progress Notes (Signed)
Wife has insulin pump.

## 2022-09-01 NOTE — Transfer of Care (Signed)
Immediate Anesthesia Transfer of Care Note  Patient: Kenneth [name]  Procedure(s) Performed: Laminectomy and Foraminotomy - bilateral - Lumbar three-Lumbar four (Bilateral: Back)  Patient Location: PACU  Anesthesia Type:General  Level of Consciousness: drowsy, patient cooperative, and responds to stimulation  Airway & Oxygen Therapy: Patient Spontanous Breathing and Patient connected to nasal cannula oxygen  Post-op Assessment: Report given to RN, Post -op Vital signs reviewed and stable, and Patient moving all extremities X 4  Post vital signs: Reviewed and stable  Last Vitals:  Vitals Value Taken Time  BP 118/45 09/01/22 1349  Temp    Pulse 59 09/01/22 1351  Resp 14 09/01/22 1351  SpO2 100 % 09/01/22 1351  Vitals shown include unvalidated device data.  Last Pain:  Vitals:   09/01/22 1015  TempSrc:   PainSc: 9          Complications: No notable events documented.

## 2022-09-01 NOTE — H&P (Signed)
Kenneth [name] is an 66 y.o. male.   Chief Complaint: Back pain HPI: 66 year old male with severe back and bilateral lower extremity pain numbness and weakness consistent with neurogenic claudication.  Symptoms of failed conservative management.  Workup demonstrates evidence of severe multilevel lumbar degenerative disease with very severe stenosis at L3-4.  Patient presents now for decompressive surgery at L3-4 in hopes improving her symptoms.  Past Medical History:  Diagnosis Date   Arthritis    hands   CAD (coronary artery disease) 10/30/2016   NSTEMI 9/17 with CABG x 3 (LIMA-LAD, SVG-OM, SVG-RCA).   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   type 1 DM   Dyspnea    with exertion   ESRD on hemodialysis ([place])    "MWF; [place] [facility]" (03/30/2018)   Gastroesophageal reflux disease 08/18/2016   Hyperlipidemia    Hypertension    Ischemic rest pain of lower extremity 02/06/2015   Keratoma 02/27/2015   Metatarsal deformity 02/27/2015   PAF (paroxysmal atrial fibrillation) (H[facility])    Pronation deformity of both feet 02/27/2015    Past Surgical History:  Procedure Laterality Date   A/V FISTULAGRAM Left 03/06/2021   Procedure: A/V FISTULAGRAM;  Surgeon: Marty Heck, MD;  Location: [place] [facility];  Service: Cardiovascular;  Laterality: Left;   A/V FISTULAGRAM Left 06/02/2022   Procedure: A/V Fistulagram;  Surgeon: Serafina Mitchell, MD;  Location: [place] [facility];  Service: Cardiovascular;  Laterality: Left;   AV FISTULA PLACEMENT Left 06/22/2016   Procedure: ARTERIOVENOUS (AV) FISTULA CREATION LEFT UPPER ARM;  Surgeon: Rosetta Posner, MD;  Location: [place];  Service: Vascular;  Laterality: Left;   BIOPSY  12/19/2020   Procedure: BIOPSY;  Surgeon: Ladene Artist, MD;  Location: [place];  Service: Endoscopy;;   CARDIAC CATHETERIZATION N/A  05/31/2016   Procedure: Left Heart Cath and Coronary Angiography;  Surgeon: Jettie Booze, MD;  Location: [place] [facility];  Service: Cardiovascular;  Laterality: N/A;   CARDIAC CATHETERIZATION N/A 05/31/2016   Procedure: Right Heart Cath;  Surgeon: Jettie Booze, MD;  Location: [place] [facility];  Service: Cardiovascular;  Laterality: N/A;   CARDIAC CATHETERIZATION N/A 05/31/2016   Procedure: IABP Insertion;  Surgeon: Jettie Booze, MD;  Location: [place] [facility];  Service: Cardiovascular;  Laterality: N/A;   COLONOSCOPY WITH PROPOFOL N/A 12/20/2020   Procedure: COLONOSCOPY WITH PROPOFOL;  Surgeon: Mauri Pole, MD;  Location: [place] [facility];  Service: Endoscopy;  Laterality: N/A;   CORONARY ARTERY BYPASS GRAFT N/A 06/05/2016   Procedure: CORONARY ARTERY BYPASS GRAFTING (CABG) x3 LIMA to LAD -SVG to OM -SVG to RCA;  Surgeon: Ivin Poot, MD;  Location: [place];  Service: Open Heart Surgery;  Laterality: N/A;   CORONARY STENT INTERVENTION N/A 07/07/2017   Procedure: CORONARY STENT INTERVENTION;  Surgeon: Wellington Hampshire, MD;  Location: [place] [facility];  Service: Cardiovascular;  Laterality: N/A;   CORONARY STENT INTERVENTION N/A 03/30/2018   Procedure: CORONARY STENT INTERVENTION;  Surgeon: Martinique, Peter M, MD;  Location: [place] [facility];  Service: Cardiovascular;  Laterality: N/A;   ESOPHAGOGASTRODUODENOSCOPY (EGD) WITH PROPOFOL N/A 12/19/2020   Procedure: ESOPHAGOGASTRODUODENOSCOPY (EGD) WITH PROPOFOL;  Surgeon: Ladene Artist, MD;  Location: [facility];  Service: Endoscopy;  Laterality: N/A;   EXCHANGE OF A DIALYSIS  CATHETER Right 04/16/2022   Procedure: EXCHANGE OF A DIALYSIS CATHETER;  Surgeon: Cherre Robins, MD;  Location: [place];  Service: Vascular;  Laterality: Right;   GIVENS CAPSULE STUDY N/A 12/20/2020   Procedure: GIVENS CAPSULE STUDY;  Surgeon: Mauri Pole, MD;  Location: [place] [facility];  Service: Endoscopy;  Laterality: N/A;   INSERTION OF  DIALYSIS CATHETER N/A 06/05/2016   Procedure: INSERTION OF DIALYSIS/trialysis CATHETER;  Surgeon: Ivin Poot, MD;  Location: [place];  Service: Vascular;  Laterality: N/A;   INSERTION OF DIALYSIS CATHETER Right 06/13/2016   Procedure: INSERTION OF DIALYSIS CATHETER RIGHT INTERNAL JUGULAR;  Surgeon: Conrad [place], MD;  Location: [place];  Service: Vascular;  Laterality: Right;   INSERTION OF DIALYSIS CATHETER Right 09/07/2019   Procedure: Insertion Of Dialysis Catheter;  Surgeon: Rosetta Posner, MD;  Location: [facility] OR;  Service: Vascular;  Laterality: Right;   INSERTION OF DIALYSIS CATHETER N/A 04/09/2022   Procedure: INSERTION OF TUNNELED DIALYSIS CATHETER;  Surgeon: Cherre Robins, MD;  Location: [place];  Service: Vascular;  Laterality: N/A;   INTRAOPERATIVE TRANSESOPHAGEAL ECHOCARDIOGRAM N/A 06/05/2016   Procedure: INTRAOPERATIVE TRANSESOPHAGEAL ECHOCARDIOGRAM;  Surgeon: Ivin Poot, MD;  Location: [place];  Service: Open Heart Surgery;  Laterality: N/A;   LEFT HEART CATH AND CORS/GRAFTS ANGIOGRAPHY N/A 11/03/2016   Procedure: Left Heart Cath and Cors/Grafts Angiography;  Surgeon: Troy Sine, MD;  Location: [place] [facility];  Service: Cardiovascular;  Laterality: N/A;   LEFT HEART CATH AND CORS/GRAFTS ANGIOGRAPHY N/A 07/07/2017   Procedure: LEFT HEART CATH AND CORS/GRAFTS ANGIOGRAPHY;  Surgeon: Larey Dresser, MD;  Location: [place] [facility];  Service: Cardiovascular;  Laterality: N/A;   LEFT HEART CATH AND CORS/GRAFTS ANGIOGRAPHY N/A 03/30/2018   Procedure: LEFT HEART CATH AND CORS/GRAFTS ANGIOGRAPHY;  Surgeon: Martinique, Peter M, MD;  Location: [place] [facility];  Service: Cardiovascular;  Laterality: N/A;   LEFT HEART CATH AND CORS/GRAFTS ANGIOGRAPHY N/A 04/17/2019   Procedure: LEFT HEART CATH AND CORS/GRAFTS ANGIOGRAPHY;  Surgeon: Larey Dresser, MD;  Location: [place] [facility];  Service: Cardiovascular;  Laterality: N/A;   PERIPHERAL VASCULAR BALLOON ANGIOPLASTY Left 06/02/2022    Procedure: PERIPHERAL VASCULAR BALLOON ANGIOPLASTY;  Surgeon: Serafina Mitchell, MD;  Location: [place] [facility];  Service: Cardiovascular;  Laterality: Left;   PERIPHERAL VASCULAR INTERVENTION Left 03/06/2021   Procedure: PERIPHERAL VASCULAR INTERVENTION;  Surgeon: Marty Heck, MD;  Location: [place] [facility];  Service: Cardiovascular;  Laterality: Left;   POLYPECTOMY  12/20/2020   Procedure: POLYPECTOMY;  Surgeon: Mauri Pole, MD;  Location: [place] [facility];  Service: Endoscopy;;   REVISION OF ARTERIOVENOUS GORETEX GRAFT Left 32/44/0102   Procedure: PLICATION OF ANEURYSM OF ARTERIOVENOUS FISTULA  LEFT ARM;  Surgeon: Rosetta Posner, MD;  Location: [facility];  Service: Vascular;  Laterality: Left;   REVISON OF ARTERIOVENOUS FISTULA Left 09/07/2019   Procedure: REVISON OF LEFT UPPER ARM ARTERIOVENOUS FISTULA;  Surgeon: Rosetta Posner, MD;  Location: [facility];  Service: Vascular;  Laterality: Left;   REVISON OF ARTERIOVENOUS FISTULA Left 04/09/2022   Procedure: LEFT ARM FISTULA REVISION;  Surgeon: Cherre Robins, MD;  Location: [place];  Service: Vascular;  Laterality: Left;  PERIPHERAL NERVE BLOCK   REVISON OF ARTERIOVENOUS FISTULA Left 04/16/2022   Procedure: REVISON OF ARTERIOVENOUS FISTULA;  Surgeon: Cherre Robins, MD;  Location: [facility] OR;  Service: Vascular;  Laterality: Left;   RIGHT/LEFT HEART CATH AND CORONARY ANGIOGRAPHY N/A 01/03/2021  Procedure: RIGHT/LEFT HEART CATH AND CORONARY ANGIOGRAPHY;  Surgeon: Larey Dresser, MD;  Location: [place] [facility];  Service: Cardiovascular;  Laterality: N/A;    Family History  Problem Relation Age of Onset   CAD Father    Colon cancer Father    Diabetes Brother    Social History:  reports that he quit smoking about 9 years ago. His smoking use included cigarettes. He has a 52.50 pack-year smoking history. He has never been exposed to tobacco smoke. He has never used smokeless tobacco. He reports that he does not drink alcohol and does not use  drugs.  Allergies: No Known Allergies  Medications Prior to Admission  Medication Sig Dispense Refill   atorvastatin (LIPITOR) 80 MG tablet Take 1 tablet (80 mg total) by mouth daily. 90 tablet 3   calcium acetate (PHOSLO) 667 MG capsule Take 667 mg by mouth 3 (three) times daily with meals.      carvedilol (COREG) 6.25 MG tablet Take 1 tablet (6.25 mg total) by mouth 2 (two) times daily with a meal. Hold on the mornings of dialysis. 180 tablet 3   Darbepoetin Alfa (ARANESP, ALBUMIN FREE, IJ) Darbepoetin Alfa (Aranesp)     ezetimibe (ZETIA) 10 MG tablet Take 1 tablet by mouth daily (Patient taking differently: Take 10 mg by mouth daily.) 90 tablet 3   heparin 1000 unit/mL SOLN injection Heparin Sodium (Porcine) 1,000 Units/mL Catheter Lock Venous     hydrALAZINE (APRESOLINE) 100 MG tablet Take 1 tablet (100 mg total) by mouth 3 (three) times daily. DO NOT TAKE AM AND NOON DOSES ON HD DAYS 270 tablet 2   isosorbide mononitrate (IMDUR) 60 MG 24 hr tablet Take 1&1/2 tablets (90 mg total) by mouth daily. Hold on days of dialysis on Mon, Wed, Fri. 90 tablet 5   lidocaine (LIDODERM) 5 % Apply 1 patch to skin as directed every day (May wear up to 12hours.) 30 patch 0   megestrol (MEGACE ES) 625 MG/5ML suspension Take 5 mLs (625 mg total) by mouth daily. 150 mL 1   Methoxy PEG-Epoetin Beta (MIRCERA IJ) Inject as directed.     pantoprazole (PROTONIX) 40 MG tablet Take 1 tablet (40 mg total) by mouth 2 (two) times daily. 180 tablet 1   sertraline (ZOLOFT) 50 MG tablet Take 1 tablet (50 mg total) by mouth daily. 90 tablet 2   Sodium Zirconium Cyclosilicate (LOKELMA PO) Take 1 packet by mouth 4 (four) times a week. Hold on dialysis days     Tiotropium Bromide-Olodaterol (STIOLTO RESPIMAT) 2.5-2.5 MCG/ACT AERS Inhale 2 puffs by mouth into the lungs daily. 4 g 11   traMADol (ULTRAM) 50 MG tablet Take 1-2 tablets (50-100 mg total) by mouth every 6 (six) hours as needed for pain 40 tablet 0   apixaban  (ELIQUIS) 5 MG TABS tablet Take 1 tablet (5 mg total) by mouth 2 (two) times daily. 180 tablet 2   Continuous Blood Gluc Sensor (FREESTYLE LIBRE 2 SENSOR) MISC 1 Device by Does not apply route every 14 (fourteen) days. 6 each 3   Continuous Blood Gluc Sensor (GUARDIAN SENSOR 3) MISC 1 Device by Does not apply route as directed. 9 each 3   Continuous Blood Gluc Transmit (GUARDIAN LINK 3 TRANSMITTER) MISC 1 Device by Does not apply route as directed. 1 each 3   diclofenac Sodium (VOLTAREN) 1 % GEL Apply 4 grams topically 4 (four) times daily. 100 g 2   glucose blood (ONETOUCH VERIO) test strip 1 each  by Other route in the morning, at noon, in the evening, and at bedtime. Use as instructed 400 each 2   influenza vaccine adjuvanted (FLUAD QUADRIVALENT) 0.5 ML injection Inject into the muscle. (Patient not taking: Reported on 08/27/2022) 0.5 mL 0   Insulin Lispro-aabc (LYUMJEV) 100 UNIT/ML SOLN Max Daily 30 units per insulin pump 30 mL 6   Insulin Pen Needle (PEN NEEDLES) 32G X 6 MM MISC Use as directed 3 times daily 300 each 1    Results for orders placed or performed during the hospital encounter of 09/01/22 (from the past 48 hour(s))  Glucose, capillary     Status: Abnormal   Collection Time: 09/01/22  9:36 AM  Result Value Ref Range   Glucose-Capillary 192 (H) 70 - 99 mg/dL    Comment: Glucose reference range applies only to samples taken after fasting for at least 8 hours.  I-STAT, chem 8     Status: Abnormal   Collection Time: 09/01/22  9:55 AM  Result Value Ref Range   Sodium 134 (L) 135 - 145 mmol/L   Potassium 4.7 3.5 - 5.1 mmol/L   Chloride 94 (L) 98 - 111 mmol/L   BUN 37 (H) 8 - 23 mg/dL   Creatinine, Ser 7.00 (H) 0.61 - 1.24 mg/dL   Glucose, Bld 188 (H) 70 - 99 mg/dL    Comment: Glucose reference range applies only to samples taken after fasting for at least 8 hours.   Calcium, Ion 0.95 (L) 1.15 - 1.40 mmol/L   TCO2 29 22 - 32 mmol/L   Hemoglobin 9.9 (L) 13.0 - 17.0 g/dL   HCT  29.0 (L) 39.0 - 52.0 %   No results found.  Pertinent items noted in HPI and remainder of comprehensive ROS otherwise negative.  Blood pressure (!) 136/54, pulse 61, temperature 98.9 F (37.2 C), temperature source Oral, resp. rate 17, height 5\' 5"  (1.651 m), weight 59.9 kg, SpO2 93 %.  Patient is awake and alert.  He is oriented and appropriate.  Speech is fluent.  Judgment insight are intact.  Cranial nerve function normal bilateral.  Motor examination reveals intact motor strength bilaterally since examination with decrease sensation in his L3 and L4 dermatomes bilaterally.  Deep tendon rhexis hypoactive but symmetric.  No long track signs.  Gait antalgic.  Posture flexed.  Examination head ears eyes nose throat is unremarked.  Chest and abdomen benign.  Extremities are free from injury or deformity. 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  Plan bilateral L3-4 decompressive laminotomies and foraminotomies.  Risks and benefits been explained.  Patient wishes to proceed.  Mallie Mussel A [place] Sibert 09/01/2022, 11:47 AM

## 2022-09-01 NOTE — Op Note (Signed)
Date of procedure: 09/01/2022  Date of dictation: Same  Service: Neurosurgery  Preoperative diagnosis: Lumbar stenosis with neurogenic claudication  Postoperative diagnosis: Same  Procedure Name: Bilateral L3-4 decompressive laminotomies and foraminotomies  Surgeon:Berlyn Malina A.Bjorn Hallas, M.D.  Asst. Surgeon: Ellene Route, MD  Anesthesia: General  Indication: 66 year old male with back and bilateral lower extremity pain consistent with severe neurogenic claudication.  Workup demonstrates evidence of severe spinal stenosis at L3-4.  Patient has failed conservative management presents now for decompression surgery.  Operative note: After induction of anesthesia, patient initially prone on the Wilson frame appropriate padded.  Lumbar region prepped and draped sterilely.  Incision made overlying L3-4.  Dissection performed bilaterally.  Retractor placed.  X-ray taken.  Level confirmed.  Laminotomies performed bilaterally using high-speed drill and Kerrison rongeurs to remove the anterior clearance of the lamina of L3 to medial aspect of the L3-4 facet joint and the superior rim of the L4 lamina.  Ligament flavum elevated and resected.  Facet joints undercut.  Foraminotomies completed on first exiting L3 and L4 nerve roots.  This went very thorough decompression and potentially.  There was no evidence of injury to the thecal sac and nerve roots.  Wound was then irrigated.  Gelfoam was placed topically for hemostasis.  Wound is then closed in layers with Vicryl sutures.  Steri-Strips and sterile dressing were applied.  No apparent complications.  Patient tolerated the procedure well and he returns to the recovery room postoperative

## 2022-09-02 ENCOUNTER — Encounter (HOSPITAL_COMMUNITY): Payer: Self-pay | Admitting: Neurosurgery

## 2022-09-02 ENCOUNTER — Other Ambulatory Visit (HOSPITAL_BASED_OUTPATIENT_CLINIC_OR_DEPARTMENT_OTHER): Payer: Self-pay

## 2022-09-02 DIAGNOSIS — M48062 Spinal stenosis, lumbar region with neurogenic claudication: Secondary | ICD-10-CM | POA: Diagnosis not present

## 2022-09-02 LAB — GLUCOSE, CAPILLARY
Glucose-Capillary: 169 mg/dL — ABNORMAL HIGH (ref 70–99)
Glucose-Capillary: 309 mg/dL — ABNORMAL HIGH (ref 70–99)

## 2022-09-02 LAB — RENAL FUNCTION PANEL
Albumin: 3.1 g/dL — ABNORMAL LOW (ref 3.5–5.0)
Anion gap: 15 (ref 5–15)
BUN: 67 mg/dL — ABNORMAL HIGH (ref 8–23)
CO2: 26 mmol/L (ref 22–32)
Calcium: 8.2 mg/dL — ABNORMAL LOW (ref 8.9–10.3)
Chloride: 92 mmol/L — ABNORMAL LOW (ref 98–111)
Creatinine, Ser: 9.06 mg/dL — ABNORMAL HIGH (ref 0.61–1.24)
GFR, Estimated: 6 mL/min — ABNORMAL LOW (ref >60)
Glucose, Bld: 315 mg/dL — ABNORMAL HIGH (ref 70–99)
Phosphorus: 6 mg/dL — ABNORMAL HIGH (ref 2.5–4.6)
Potassium: 4.8 mmol/L (ref 3.5–5.1)
Sodium: 133 mmol/L — ABNORMAL LOW (ref 135–145)

## 2022-09-02 LAB — CBC
HCT: 24.5 % — ABNORMAL LOW (ref 39.0–52.0)
Hemoglobin: 7.9 g/dL — ABNORMAL LOW (ref 13.0–17.0)
MCH: 30.5 pg (ref 26.0–34.0)
MCHC: 32.2 g/dL (ref 30.0–36.0)
MCV: 94.6 fL (ref 80.0–100.0)
Platelets: 127 10*3/uL — ABNORMAL LOW (ref 150–400)
RBC: 2.59 MIL/uL — ABNORMAL LOW (ref 4.22–5.81)
RDW: 13.5 % (ref 11.5–15.5)
WBC: 6.2 10*3/uL (ref 4.0–10.5)
nRBC: 0 % (ref 0.0–0.2)

## 2022-09-02 LAB — HEPATITIS B SURFACE ANTIGEN: Hepatitis B Surface Ag: NONREACTIVE

## 2022-09-02 MED ORDER — LIDOCAINE HCL (PF) 1 % IJ SOLN: 5.0000 mL | INTRAMUSCULAR | Status: DC | PRN

## 2022-09-02 MED ORDER — CHLORHEXIDINE GLUCONATE CLOTH 2 % EX PADS: 6.0000 | MEDICATED_PAD | Freq: Every day | CUTANEOUS | Status: DC

## 2022-09-02 MED ORDER — PENTAFLUOROPROP-TETRAFLUOROETH EX AERO: 1.0000 | INHALATION_SPRAY | CUTANEOUS | Status: DC | PRN

## 2022-09-02 MED ORDER — HYDROCODONE-ACETAMINOPHEN 5-325 MG PO TABS
1.0000 | ORAL_TABLET | ORAL | 0 refills | Status: DC | PRN
  Filled 2022-09-02: qty 30, 5d supply, fill #0

## 2022-09-02 MED ORDER — LIDOCAINE-PRILOCAINE 2.5-2.5 % EX CREA: 1.0000 | TOPICAL_CREAM | CUTANEOUS | Status: DC | PRN

## 2022-09-02 MED FILL — Thrombin For Soln 20000 Unit: CUTANEOUS | Qty: 1 | Status: AC

## 2022-09-02 NOTE — TOC Transition Note (Signed)
Transition of Care [facility]) - CM/SW Discharge Note   Patient Details  Name: Kenneth [name] MRN: [id number] Date of Birth: 10-15-1955  Transition of Care [facility]) CM/SW Contact:  Pollie Friar, RN Phone Number: 09/02/2022, 1:19 PM   Clinical Narrative:    Pt discharging home with home health services arranged through [facility]. Information on the AVS. [facility] will contact pt for first home visit. Pt requesting hospital bed for home. CM has sent the referral to [facility]. They will contact the patient for delivery of the bed tomorrow. Pt has transportation home.    Final next level of care: Home w Home Health Services Barriers to Discharge: No Barriers Identified   Patient Goals and CMS Choice   CMS Medicare.gov Compare Post Acute Care list provided to:: Patient      Discharge Placement                       Discharge Plan and Services                DME Arranged: Hospital bed DME Agency: [facility] Date DME Agency Contacted: 09/02/22   Representative spoke with at DME Agency: Brenton Grills HH Arranged: PT, OT [place] Agency: [place] Date [place]: 09/02/22   Representative spoke with at [place]: Brenda ([place]) Interventions     Readmission Risk Interventions    02/23/2022   12:28 PM 01/04/2021   12:48 PM  Readmission Risk Prevention Plan  Transportation Screening Complete Complete  Medication Review (RN Care Manager) Complete Complete  PCP or Specialist appointment within 3-5 days of discharge Complete   HRI or [place] Complete Complete  SW Recovery Care/Counseling Consult Complete Complete  Palliative Care Screening Not Applicable Not [facility] Not Applicable Not Applicable

## 2022-09-02 NOTE — Discharge Instructions (Addendum)
Wound Care Keep incision covered and dry for three days.   Do not put any creams, lotions, or ointments on incision. Leave steri-strips on back.  They will fall off by themselves. Activity Walk each and every day, increasing distance each day. No lifting greater than 5 lbs.  Avoid excessive neck motion. No driving for 2 weeks; may ride as a passenger locally. Diet Resume your normal diet.   Call Your Doctor If Any of These Occur Redness, drainage, or swelling at the wound.  Temperature greater than 101 degrees. Severe pain not relieved by pain medication. Incision starts to come apart. Follow Up Appt Call today for appointment in 1-2 weeks ([phone]) or for problems.  If you have any hardware placed in your spine, you will need an x-ray before your appointment.  

## 2022-09-02 NOTE — Anesthesia Postprocedure Evaluation (Signed)
Anesthesia Post Note  Patient: Kenneth [name]  Procedure(s) Performed: Laminectomy and Foraminotomy - bilateral - Lumbar three-Lumbar four (Bilateral: Back)     Patient location during evaluation: PACU Anesthesia Type: General Level of consciousness: awake and alert Pain management: pain level controlled Vital Signs Assessment: post-procedure vital signs reviewed and stable Respiratory status: spontaneous breathing, nonlabored ventilation, respiratory function stable and patient connected to nasal cannula oxygen Cardiovascular status: blood pressure returned to baseline and stable Postop Assessment: no apparent nausea or vomiting Anesthetic complications: no   No notable events documented.  Last Vitals:  Vitals:   09/02/22 0440 09/02/22 0809  BP: (!) 132/56 (!) 99/44  Pulse: 64 66  Resp: 18 16  Temp: 36.9 C 36.9 C  SpO2: 100% 95%    Last Pain:  Vitals:   09/02/22 0809  TempSrc: Oral  PainSc:                  [name]

## 2022-09-02 NOTE — Progress Notes (Signed)
[facility] Observation Note  Pt admitted for lumbar decompressive surgery. Surgery completed 09/01/22, reportedly went well. Nephrology consulted for HD prior to discharge. Pt examined at bedside, denies SOB, CP, dizziness, abdominal pain, nausea, fever and chills. Will plan for routine HD today.  Anice Paganini, PA-C 09/02/2022, 12:04 PM  [facility] Pager: [phone]

## 2022-09-02 NOTE — Progress Notes (Signed)
   09/02/22 1843  Vitals  Temp 98 F (36.7 C)  BP (!) 139/56  MAP (mmHg) 81  BP Location Right Arm  BP Method Automatic  Patient Position (if appropriate) Lying  Pulse Rate 71  Pulse Rate Source Monitor  ECG Heart Rate 70  Resp 15  Oxygen Therapy  SpO2 98 %  O2 Device Room Air  Pulse Oximetry Type Continuous   Received patient in bed to unit.  Alert and oriented.  Informed consent signed and in chart.   Treatment initiated: 1434 Treatment completed: 1833  Patient tolerated well.  Transported back to the room  Alert, without acute distress.  Hand-off given to patient's nurse.   Access used: AVF  Access issues: NA  Total UF removed: 1564ml Medication(s) given: NA Post HD VS: see above Post HD weight: 60.7kg   [facility]

## 2022-09-02 NOTE — Evaluation (Signed)
Occupational Therapy Evaluation Patient Details Name: Kenneth [name] MRN: [id number] DOB: April 15, 1956 Today's Date: 09/02/2022   History of Present Illness 66 y.o. male with severe spinal stenosis at L3-4 who has failed conservative management of back and BLE pain. Now s/p bil L3-4 decompressive laminotomies and foraminotomies 12/19. PMH significant for CAD, CHF, DM, ESRD on hemodialysis, GERD, HLD, HTN, metatarsal deformity, and PAF.   Clinical Impression   PTA, pt lived with his wife who he reports assisted with ADL and IADL at baseline. Upon eval, pt requires min guard-min A for functional mobility, transfers, and LB dressing. Pt educated and demonstrating use of compensatory techniques for LB dressing, shower transfer, bed mobility, toileting and grooming within precautions. Pt required multimodal cues to maintain precautions during functional mobility and ADL. Recommending discharge home with HHOT to optimize safety and independence in ADL and IADL.      Recommendations for follow up therapy are one component of a multi-disciplinary discharge planning process, led by the attending physician.  Recommendations may be updated based on patient status, additional functional criteria and insurance authorization.   Follow Up Recommendations  Home health OT     Assistance Recommended at Discharge Frequent or constant Supervision/Assistance  Patient can return home with the following A little help with walking and/or transfers;A little help with bathing/dressing/bathroom;Assistance with cooking/housework;Direct supervision/assist for medications management;Direct supervision/assist for financial management;Assist for transportation;Help with stairs or ramp for entrance    Functional Status Assessment  Patient has had a recent decline in their functional status and demonstrates the ability to make significant improvements in function in a reasonable and predictable amount of time.  Equipment  Recommendations  Other (comment) (hospital bed)    Recommendations for Other Services       Precautions / Restrictions Precautions Precautions: Back Precaution Booklet Issued: Yes (comment) Precaution Comments: All education reviewed within the context of ADL. Pt able to recall 2/3 precautions, but requries external cueing for adherence. [name] used [phone] Required Braces or Orthoses:  (no brace needed orders) Restrictions Weight Bearing Restrictions: No      Mobility Bed Mobility Overal bed mobility: Needs Assistance Bed Mobility: Rolling, Sidelying to Sit, Sit to Sidelying Rolling: Min assist Sidelying to sit: Min guard     Sit to sidelying: Min assist General bed mobility comments: Min A for guidance.    Transfers Overall transfer level: Needs assistance Equipment used: Rolling walker (2 wheels) Transfers: Sit to/from Stand Sit to Stand: Min guard           General transfer comment: Min guard A for safety      Balance Overall balance assessment: Needs assistance Sitting-balance support: No upper extremity supported, Feet supported Sitting balance-Leahy Scale: Fair Sitting balance - Comments: Supervision EOB. Frequent cueing for precautions   Standing balance support: Single extremity supported, Bilateral upper extremity supported, No upper extremity supported, During functional activity Standing balance-Leahy Scale: Poor Standing balance comment: Reliant on device.                           ADL either performed or assessed with clinical judgement   ADL Overall ADL's : Needs assistance/impaired Eating/Feeding: Set up;Supervision/ safety;Sitting Eating/Feeding Details (indicate cue type and reason): Suopervision due to tendency to lean forward sitting EOB Grooming: Min guard;Standing Grooming Details (indicate cue type and reason): Min guard A for safety Upper Body Bathing: Supervision/ safety;Sitting   Lower Body Bathing: Minimal  assistance;Sit to/from stand   Upper Body  Dressing : Supervision/safety;Sitting   Lower Body Dressing: Minimal assistance;Sit to/from stand Lower Body Dressing Details (indicate cue type and reason): Pt reporting wifer usually performs LB dressing for him. Able to perform with min A at time of eval. Toilet Transfer: Min guard;Minimal assistance;Rolling walker (2 wheels);Ambulation;Cueing for sequencing;Cueing for safety Toilet Transfer Details (indicate cue type and reason): Min guard A for safety, intermittent min A for management of device and precautions.     Tub/ Shower Transfer: Min guard;Minimal assistance;Ambulation;Adhering to back precautions;Rolling walker (2 wheels);Shower Scientist, research (medical) Details (indicate cue type and reason): Min guard a for safety. Multimodal cues for adherence to precautions. Min A 1x for problem solving Functional mobility during ADLs: Min guard;Minimal assistance;Rolling walker (2 wheels) General ADL Comments: Up to min A without RW; Continues to require intermittent min A with RW for placement and RW management. Unsure if novel task due to RN reports use of rollator at home?     Vision Baseline Vision/History: 0 No visual deficits Ability to See in Adequate Light: 0 Adequate Patient Visual Report: No change from baseline Vision Assessment?: No apparent visual deficits Additional Comments: WFL for tasks assessed     Perception Perception Perception Tested?: No   Praxis Praxis Praxis tested?: Not tested    Pertinent Vitals/Pain Pain Assessment Pain Assessment: Faces Faces Pain Scale: Hurts a little bit Pain Location: operative site Pain Descriptors / Indicators: Sore Pain Intervention(s): Limited activity within patient's tolerance, Monitored during session     Hand Dominance Right   Extremity/Trunk Assessment Upper Extremity Assessment Upper Extremity Assessment: Generalized weakness   Lower Extremity Assessment Lower Extremity  Assessment: Defer to PT evaluation   Cervical / Trunk Assessment Cervical / Trunk Assessment: Back Surgery   Communication Communication Communication: Interpreter utilized;Prefers language other than [place] ([name] [phone])   Cognition Arousal/Alertness: Awake/alert Behavior During Therapy: WFL for tasks assessed/performed Overall Cognitive Status: No family/caregiver present to determine baseline cognitive functioning                                 General Comments: recalling 2/3 precautions. Requires multimodal cues to maintain     General Comments  Pt observed to be relatively itchy, scratching back, legs, etc sitting EOB.    Exercises     Shoulder Instructions      Home Living Family/patient expects to be discharged to:: Private residence Living Arrangements: Spouse/significant other Available Help at Discharge: Family Type of Home: House Home Access: Stairs to enter Technical brewer of Steps: 5 Entrance Stairs-Rails: Right;Left (Per previous info in chart, just R, but pt reports R and L) Home Layout: Two level;Bed/bath upstairs Alternate Level Stairs-Number of Steps: 15 Alternate Level Stairs-Rails: Right Bathroom Shower/Tub: Walk-in shower         Home Equipment: Shower seat - built in          Prior Functioning/Environment Prior Level of Function : Needs assist             Mobility Comments: uses cane within the home. Rollator for community mobility ADLs Comments: Per pt, wife assists with "everything" bathing, dressing, and cooking per pt        OT Problem List: Decreased strength;Decreased activity tolerance;Impaired balance (sitting and/or standing);Decreased safety awareness;Decreased cognition;Decreased knowledge of use of DME or AE;Decreased knowledge of precautions      OT Treatment/Interventions: Self-care/ADL training;Therapeutic exercise;DME and/or AE instruction;Therapeutic activities;Cognitive  remediation/compensation;Patient/family education;Balance training    OT  Goals(Current goals can be found in the care plan section) Acute Rehab OT Goals Patient Stated Goal: go home OT Goal Formulation: With patient Time For Goal Achievement: 09/16/22 Potential to Achieve Goals: Good  OT Frequency: Min 2X/week    Co-evaluation              AM-PAC OT "6 Clicks" Daily Activity     Outcome Measure Help from another person eating meals?: A Little Help from another person taking care of personal grooming?: A Little Help from another person toileting, which includes using toliet, bedpan, or urinal?: A Little Help from another person bathing (including washing, rinsing, drying)?: A Little Help from another person to put on and taking off regular upper body clothing?: A Little Help from another person to put on and taking off regular lower body clothing?: A Little 6 Click Score: 18   End of Session Equipment Utilized During Treatment: Gait belt;Rolling walker (2 wheels) Nurse Communication: Mobility status  Activity Tolerance: Patient tolerated treatment well Patient left: in bed;with call bell/phone within reach;Other (comment) (respiratory in room)  OT Visit Diagnosis: Unsteadiness on feet (R26.81);Muscle weakness (generalized) (M62.81);Other abnormalities of gait and mobility (R26.89);Other symptoms and signs involving cognitive function                Time: 2440-1027 OT Time Calculation (min): 21 min Charges:  OT General Charges $OT Visit: 1 Visit OT Evaluation $OT Eval Low Complexity: 1 Low  Elder Cyphers, OTR/L [facility] Acute Rehabilitation Office: [phone]   Magnus Ivan 09/02/2022, 10:30 AM

## 2022-09-02 NOTE — Progress Notes (Signed)
D/C orders noted. Case discussed with renal PA. It was felt that pt was most appropriate for inpt HD due to pt's potassium. Pt receives out-pt HD at [facility] on MWF. Will contact clinic to advise them of pt's d/c today and should resume on Friday.   Melven Sartorius Renal Navigator [phone]

## 2022-09-02 NOTE — Progress Notes (Signed)
VS stable. Pt discharged to home with wife via car. Discharge packet reviewed with patient and wife. Pt and wife state understanding.

## 2022-09-02 NOTE — Progress Notes (Cosign Needed)
Question Answer  Length of Need 6 Months  The above medical condition requires: Patient requires the ability to reposition frequently  Head must be elevated greater than: 30 degrees  Bed type Semi-electric

## 2022-09-02 NOTE — Evaluation (Signed)
Physical Therapy Evaluation  Patient Details Name: Kenneth [name] MRN: [id number] DOB: 06/13/1956 Today's Date: 09/02/2022  History of Present Illness  66 y.o. male with severe spinal stenosis at L3-4 who has failed conservative management of back and BLE pain. Now s/p bil L3-4 decompressive laminotomies and foraminotomies 12/19. PMH significant for CAD, CHF, DM, ESRD on hemodialysis, GERD, HLD, HTN, metatarsal deformity, and PAF.   Clinical Impression  Pt admitted with above diagnosis. At the time of PT eval, pt was able to demonstrate transfers and ambulation with gross min guard assist to supervision for safety and RW for support. Pt was educated on precautions, positioning recommendations, appropriate activity progression, and car transfer. Pt currently with functional limitations due to the deficits listed below (see PT Problem List). Pt will benefit from skilled PT to increase their independence and safety with mobility to allow discharge to the venue listed below.         Recommendations for follow up therapy are one component of a multi-disciplinary discharge planning process, led by the attending physician.  Recommendations may be updated based on patient status, additional functional criteria and insurance authorization.  Follow Up Recommendations Home health PT      Assistance Recommended at Discharge Frequent or constant Supervision/Assistance  Patient can return home with the following  A little help with walking and/or transfers;A little help with bathing/dressing/bathroom;Assistance with cooking/housework;Assist for transportation;Help with stairs or ramp for entrance    Equipment Recommendations Rolling walker (2 wheels);Hospital bed  Recommendations for Other Services       Functional Status Assessment Patient has had a recent decline in their functional status and demonstrates the ability to make significant improvements in function in a reasonable and predictable amount  of time.     Precautions / Restrictions Precautions Precautions: Back Precaution Booklet Issued: Yes (comment) Precaution Comments: Reviewed handout and pt was cued for precautions during functional mobility. Required Braces or Orthoses:  (no brace needed orders) Restrictions Weight Bearing Restrictions: No      Mobility  Bed Mobility Overal bed mobility: Needs Assistance Bed Mobility: Rolling, Sidelying to Sit, Sit to Sidelying Rolling: Supervision Sidelying to sit: Min guard       General bed mobility comments: HOB flat and rails lowered to simulate home environment. Pt was able to manage well with cues and light guard for optimal log roll technique.    Transfers Overall transfer level: Needs assistance Equipment used: Rolling walker (2 wheels) Transfers: Sit to/from Stand Sit to Stand: Min guard           General transfer comment: Close guard for safety as pt powered up to full stand. VC's for improved posture throughout.    Ambulation/Gait Ambulation/Gait assistance: Min guard Gait Distance (Feet): 150 Feet Assistive device: Rolling walker (2 wheels) Gait Pattern/deviations: Step-through pattern, Decreased stride length, Trunk flexed, Narrow base of support Gait velocity: Decreased Gait velocity interpretation: <1.31 ft/sec, indicative of household ambulator   General Gait Details: VC's for improved posture, closer walker proximity, and forward gaze. No assist required but hands on guarding provided throughout for safety.  Stairs Stairs: Yes Stairs assistance: Min guard Stair Management: Two rails, Step to pattern, Forwards Number of Stairs: 10 General stair comments: VC's for sequencing and general safety. Pt managed well but with grossly flexed trunk. Able to make corrective changes with cues.  Wheelchair Mobility    Modified Rankin (Stroke Patients Only)       Balance Overall balance assessment: Needs assistance Sitting-balance support: No upper  extremity supported, Feet supported Sitting balance-Leahy Scale: Fair Sitting balance - Comments: Supervision EOB. Frequent cueing for precautions   Standing balance support: Single extremity supported, Bilateral upper extremity supported, No upper extremity supported, During functional activity Standing balance-Leahy Scale: Poor Standing balance comment: Reliant on device.                             Pertinent Vitals/Pain Pain Assessment Pain Assessment: Faces Faces Pain Scale: Hurts a little bit Pain Location: operative site Pain Descriptors / Indicators: Sore Pain Intervention(s): Limited activity within patient's tolerance, Monitored during session, Repositioned    Home Living Family/patient expects to be discharged to:: Private residence Living Arrangements: Spouse/significant other Available Help at Discharge: Family Type of Home: House Home Access: Stairs to enter Entrance Stairs-Rails: Right;Left (Per previous info in chart, just R, but pt reports R and L) Entrance Stairs-Number of Steps: 5 Alternate Level Stairs-Number of Steps: 15 Home Layout: Two level;Bed/bath upstairs Home Equipment: Shower seat - built in      Prior Function Prior Level of Function : Needs assist             Mobility Comments: uses cane within the home. Rollator for community mobility. Pt reports it is hard for him to manage stairs after HD because he is so fatigued ADLs Comments: Per pt, wife assists with "everything" bathing, dressing, and cooking per pt     Hand Dominance   Dominant Hand: Right    Extremity/Trunk Assessment   Upper Extremity Assessment Upper Extremity Assessment: Defer to OT evaluation    Lower Extremity Assessment Lower Extremity Assessment: Generalized weakness (Mild; consistent with pre-op diagnosis)    Cervical / Trunk Assessment Cervical / Trunk Assessment: Back Surgery  Communication   Communication: Interpreter utilized;Prefers language  other than [place] ([name] [phone])  Cognition Arousal/Alertness: Awake/alert Behavior During Therapy: WFL for tasks assessed/performed Overall Cognitive Status: No family/caregiver present to determine baseline cognitive functioning                                 General Comments: recalling 2/3 precautions. Requires multimodal cues to maintain        General Comments General comments (skin integrity, edema, etc.): Pt observed to be relatively itchy, scratching back, legs, etc sitting EOB.    Exercises     Assessment/Plan    PT Assessment Patient needs continued PT services  PT Problem List Decreased strength;Decreased activity tolerance;Decreased balance;Decreased mobility;Decreased knowledge of use of DME;Decreased safety awareness;Decreased knowledge of precautions;Pain       PT Treatment Interventions DME instruction;Stair training;Gait training;Functional mobility training;Therapeutic activities;Therapeutic exercise;Balance training;Patient/family education    PT Goals (Current goals can be found in the Care Plan section)  Acute Rehab PT Goals Patient Stated Goal: Return home at d/c PT Goal Formulation: With patient Time For Goal Achievement: 09/09/22 Potential to Achieve Goals: Good    Frequency Min 5X/week     Co-evaluation               AM-PAC PT "6 Clicks" Mobility  Outcome Measure Help needed turning from your back to your side while in a flat bed without using bedrails?: A Little Help needed moving from lying on your back to sitting on the side of a flat bed without using bedrails?: A Little Help needed moving to and from a bed to a chair (including a wheelchair)?: A Little  Help needed standing up from a chair using your arms (e.g., wheelchair or bedside chair)?: A Little Help needed to walk in hospital room?: A Little Help needed climbing 3-5 steps with a railing? : A Little 6 Click Score: 18    End of Session Equipment  Utilized During Treatment: Gait belt Activity Tolerance: Patient tolerated treatment well Patient left: in chair;with call bell/phone within reach Nurse Communication: Mobility status PT Visit Diagnosis: Unsteadiness on feet (R26.81);Pain Pain - part of body:  (back)    Time: 6045-4098 PT Time Calculation (min) (ACUTE ONLY): 25 min   Charges:   PT Evaluation $PT Eval Low Complexity: 1 Low PT Treatments $Gait Training: 8-22 mins        Rolinda Roan, PT, DPT Acute Rehabilitation Services Secure Chat Preferred Office: [phone]   Thelma Comp 09/02/2022, 1:21 PM

## 2022-09-02 NOTE — Discharge Summary (Signed)
Physician Discharge Summary  Patient ID: Kenneth [name] MRN: [id number] DOB/AGE: Jan 05, 1956 66 y.o.  Admit date: 09/01/2022 Discharge date: 09/02/2022  Admission Diagnoses:  Discharge Diagnoses:  Principal Problem:   Lumbar stenosis with neurogenic claudication   Discharged Condition: good  Hospital Course: Patient admitted to the hospital where he underwent uncomplicated lumbar decompressive surgery.  Postoperatively doing well.  Preoperative back and lower extremity pain much improved.  Standing and ambulating without difficulty.  Plan for discharge home.  Patient to receive dialysis prior to discharge home.  Consults:   Significant Diagnostic Studies:   Treatments:   Discharge Exam: Blood pressure (!) 99/44, pulse 66, temperature 98.4 F (36.9 C), temperature source Oral, resp. rate 16, height 5\' 5"  (1.651 m), weight 59.9 kg, SpO2 95 %. Awake and alert.  Oriented and appropriate.  Motor and sensory function intact.  Wound clean and dry.  Chest and abdomen benign.  Disposition: Discharge disposition: 01-Home or Self Care        Allergies as of 09/02/2022   No Known Allergies      Medication List     STOP taking these medications    Fluad Quadrivalent 0.5 ML injection Generic drug: influenza vaccine adjuvanted       TAKE these medications    ARANESP (ALBUMIN FREE) IJ Darbepoetin Alfa (Aranesp)   atorvastatin 80 MG tablet Commonly known as: LIPITOR Take 1 tablet (80 mg total) by mouth daily.   calcium acetate 667 MG capsule Commonly known as: PHOSLO Take 667 mg by mouth 3 (three) times daily with meals.   carvedilol 6.25 MG tablet Commonly known as: COREG Take 1 tablet (6.25 mg total) by mouth 2 (two) times daily with a meal. Hold on the mornings of dialysis.   diclofenac Sodium 1 % Gel Commonly known as: Voltaren Apply 4 grams topically 4 (four) times daily.   Eliquis 5 MG Tabs tablet Generic drug: apixaban Take 1 tablet (5 mg total) by mouth  2 (two) times daily.   ezetimibe 10 MG tablet Commonly known as: ZETIA Take 1 tablet by mouth daily What changed:  how much to take how to take this when to take this   FreeStyle Libre 2 Sensor Misc 1 Device by Does not apply route every 14 (fourteen) days.   Guardian Sensor 3 Misc 1 Device by Does not apply route as directed.   Guardian Link 3 Transmitter Misc 1 Device by Does not apply route as directed.   heparin 1000 unit/mL Soln injection Heparin Sodium (Porcine) 1,000 Units/mL Catheter Lock Venous   hydrALAZINE 100 MG tablet Commonly known as: APRESOLINE Take 1 tablet (100 mg total) by mouth 3 (three) times daily. DO NOT TAKE AM AND NOON DOSES ON HD DAYS   HYDROcodone-acetaminophen 5-325 MG tablet Commonly known as: NORCO/VICODIN Take 1 tablet by mouth every 4 (four) hours as needed for moderate pain ((score 4 to 6)).   isosorbide mononitrate 60 MG 24 hr tablet Commonly known as: IMDUR Take 1&1/2 tablets (90 mg total) by mouth daily. Hold on days of dialysis on Mon, Wed, Fri.   lidocaine 5 % Commonly known as: LIDODERM Apply 1 patch to skin as directed every day (May wear up to 12hours.)   LOKELMA PO Take 1 packet by mouth 4 (four) times a week. Hold on dialysis days   Lyumjev 100 UNIT/ML Soln Generic drug: Insulin Lispro-aabc Max Daily 30 units per insulin pump   megestrol 625 MG/5ML suspension Commonly known as: Megace ES Take 5 mLs (625  mg total) by mouth daily.   MIRCERA IJ Inject as directed.   OneTouch Verio test strip Generic drug: glucose blood 1 each by Other route in the morning, at noon, in the evening, and at bedtime. Use as instructed   pantoprazole 40 MG tablet Commonly known as: PROTONIX Take 1 tablet (40 mg total) by mouth 2 (two) times daily.   sertraline 50 MG tablet Commonly known as: Zoloft Take 1 tablet (50 mg total) by mouth daily.   Stiolto Respimat 2.5-2.5 MCG/ACT Aers Generic drug: Tiotropium Bromide-Olodaterol Inhale 2  puffs by mouth into the lungs daily.   TechLite Pen Needles 32G X 6 MM Misc Generic drug: Insulin Pen Needle Use as directed 3 times daily   traMADol 50 MG tablet Commonly known as: ULTRAM Take 1-2 tablets (50-100 mg total) by mouth every 6 (six) hours as needed for pain         Signed: Cooper Render Marlen Koman 09/02/2022, 10:07 AM

## 2022-09-03 ENCOUNTER — Telehealth: Payer: Self-pay

## 2022-09-03 LAB — HEPATITIS B SURFACE ANTIBODY, QUANTITATIVE: Hep B S AB Quant (Post): 558.8 m[IU]/mL (ref >9.9)

## 2022-09-03 NOTE — Telephone Encounter (Signed)
Kelly from [facility] notified this CM that orders were not placed for Home Health. The patient discharged yesterday. Dr Annette Stable messaged regarding this.

## 2022-09-05 NOTE — Telephone Encounter (Signed)
Kenneth [name] From [facility] called this RNCM to state orders for home health were not placed. Message with provider and they will place orders.

## 2022-09-11 ENCOUNTER — Telehealth: Payer: Self-pay | Admitting: Family Medicine

## 2022-09-11 NOTE — Telephone Encounter (Signed)
Caller/Agency: Gracie ([facility]) Callback Number: [phone] Requesting OT/PT/Skilled Nursing/Social Work/Speech Therapy: PT Frequency: 1 w 2, 2 w 4, 1 w 3

## 2022-09-15 ENCOUNTER — Telehealth: Payer: Self-pay | Admitting: Family Medicine

## 2022-09-15 ENCOUNTER — Encounter: Payer: Self-pay | Admitting: Family Medicine

## 2022-09-15 ENCOUNTER — Other Ambulatory Visit (HOSPITAL_BASED_OUTPATIENT_CLINIC_OR_DEPARTMENT_OTHER): Payer: Self-pay

## 2022-09-15 MED ORDER — HYDROCODONE-ACETAMINOPHEN 5-325 MG PO TABS
1.0000 | ORAL_TABLET | ORAL | 0 refills | Status: DC | PRN
  Filled 2022-09-15: qty 40, 7d supply, fill #0

## 2022-09-15 MED ORDER — LIDOCAINE 5 % EX PTCH
MEDICATED_PATCH | CUTANEOUS | 0 refills | Status: DC
  Filled 2022-09-15: qty 30, 30d supply, fill #0

## 2022-09-15 NOTE — Telephone Encounter (Signed)
Called left detailed message of PCP verbal ok for request.

## 2022-09-15 NOTE — Telephone Encounter (Signed)
Kenneth [name] dropped of form for patient Patient cannot speak english  This form is too exclude him for [place] in bin up front  Kenneth [name] would like tobe called when ready for pick up

## 2022-09-16 NOTE — Telephone Encounter (Signed)
Form completed/mailed to the home of patient per [facility] request

## 2022-09-17 ENCOUNTER — Ambulatory Visit (INDEPENDENT_AMBULATORY_CARE_PROVIDER_SITE_OTHER): Payer: Medicare Other

## 2022-09-17 DIAGNOSIS — E1040 Type 1 diabetes mellitus with diabetic neuropathy, unspecified: Secondary | ICD-10-CM

## 2022-09-17 DIAGNOSIS — M2041 Other hammer toe(s) (acquired), right foot: Secondary | ICD-10-CM

## 2022-09-17 DIAGNOSIS — M2042 Other hammer toe(s) (acquired), left foot: Secondary | ICD-10-CM

## 2022-09-23 ENCOUNTER — Other Ambulatory Visit (HOSPITAL_BASED_OUTPATIENT_CLINIC_OR_DEPARTMENT_OTHER): Payer: Self-pay

## 2022-09-23 NOTE — Addendum Note (Signed)
Addended by: Gardiner Barefoot on: 09/23/2022 10:19 AM   Modules accepted: Orders

## 2022-09-24 ENCOUNTER — Other Ambulatory Visit (HOSPITAL_BASED_OUTPATIENT_CLINIC_OR_DEPARTMENT_OTHER): Payer: Self-pay

## 2022-09-24 MED ORDER — METHYLPREDNISOLONE 4 MG PO TBPK
ORAL_TABLET | ORAL | 0 refills | Status: DC
  Filled 2022-09-24: qty 21, 6d supply, fill #0

## 2022-09-26 NOTE — Progress Notes (Signed)
Patient presents to the office today for diabetic shoe and insole measuring.  Patient was measured with brannock device to determine size and width for 1 pair of extra depth shoes and foam casted for 3 pair of insoles.   ABN signed.   Documentation of medical necessity will be sent to patient's treating diabetic doctor to verify and sign.   Patient's diabetic provider: DR [facility]  Shoes and insoles will be ordered at that time and patient will be notified for an appointment for fitting when they arrive.    Patient shoe selection-   1st   Shoe choice:   [id number]

## 2022-09-30 ENCOUNTER — Encounter: Payer: Self-pay | Admitting: Neurology

## 2022-10-05 ENCOUNTER — Encounter: Payer: Self-pay | Admitting: Nurse Practitioner

## 2022-10-06 ENCOUNTER — Encounter: Payer: Self-pay | Admitting: Family Medicine

## 2022-10-06 ENCOUNTER — Ambulatory Visit (INDEPENDENT_AMBULATORY_CARE_PROVIDER_SITE_OTHER): Payer: 59 | Admitting: Family Medicine

## 2022-10-06 ENCOUNTER — Other Ambulatory Visit (HOSPITAL_BASED_OUTPATIENT_CLINIC_OR_DEPARTMENT_OTHER): Payer: Self-pay

## 2022-10-06 VITALS — BP 160/80 | HR 67 | Temp 98.0°F | Wt 138.1 lb

## 2022-10-06 DIAGNOSIS — S3991XA Unspecified injury of abdomen, initial encounter: Secondary | ICD-10-CM | POA: Diagnosis not present

## 2022-10-06 DIAGNOSIS — E1159 Type 2 diabetes mellitus with other circulatory complications: Secondary | ICD-10-CM

## 2022-10-06 DIAGNOSIS — F339 Major depressive disorder, recurrent, unspecified: Secondary | ICD-10-CM | POA: Insufficient documentation

## 2022-10-06 DIAGNOSIS — I152 Hypertension secondary to endocrine disorders: Secondary | ICD-10-CM

## 2022-10-06 DIAGNOSIS — T148XXA Other injury of unspecified body region, initial encounter: Secondary | ICD-10-CM

## 2022-10-06 MED ORDER — METHYLPREDNISOLONE 4 MG PO TBPK
ORAL_TABLET | ORAL | 0 refills | Status: DC
  Filled 2022-10-06: qty 21, 6d supply, fill #0

## 2022-10-06 MED ORDER — DULOXETINE HCL 30 MG PO CPEP
30.0000 mg | ORAL_CAPSULE | Freq: Every day | ORAL | 3 refills | Status: DC
  Filled 2022-10-06: qty 30, 30d supply, fill #0
  Filled 2022-11-02: qty 30, 30d supply, fill #1
  Filled 2022-11-29: qty 30, 30d supply, fill #2
  Filled 2023-01-04: qty 30, 30d supply, fill #3

## 2022-10-06 MED ORDER — CARVEDILOL 12.5 MG PO TABS
12.5000 mg | ORAL_TABLET | Freq: Two times a day (BID) | ORAL | 3 refills | Status: DC
  Filled 2022-10-06: qty 60, 30d supply, fill #0
  Filled 2022-10-25 – 2022-10-29 (×4): qty 60, 30d supply, fill #1
  Filled 2022-11-29: qty 60, 30d supply, fill #2
  Filled 2023-01-04: qty 60, 30d supply, fill #3

## 2022-10-06 NOTE — Progress Notes (Signed)
Chief Complaint  Patient presents with   Follow-up    Redness around surgical sight Patient in pain today Pain in thigh to toe and not able to tolerate Skin on toe causing bleeding     Subjective Kenneth [name] presents for f/u anxiety/depression.  Here with the aid of a [name].  His spouse is also present.  Pt is currently being treated with Zoloft 50 mg/d.  Reports doing poorly with depression since treatment. No thoughts of harming self or others. No self-medication with alcohol, prescription drugs or illicit drugs. Pt is not following with a counselor/psychologist.  Hypertension Patient presents for hypertension follow up. He does monitor home blood pressures. Blood pressures ranging on average from 140-150's/50-60's. He is compliant with medication- hydralazine 100 mg TID, holding AM and noon dose on HD dosages, Coreg 6.25 mg/d. Patient has these side effects of medication: none He is adhering to a healthy diet overall. Exercise: lmited No new sob. No CP.   Past Medical History:  Diagnosis Date   Arthritis    hands   CAD (coronary artery disease) 10/30/2016   NSTEMI 9/17 with CABG x 3 (LIMA-LAD, SVG-OM, SVG-RCA).   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 No respiratory distress Skin: See below; no fluctuance or excessive warmth. Psych: well oriented with normal range of affect and age-appropriate judgement/insight, alert and oriented x4.    Assessment and Plan  Depression, recurrent ([place]) - Plan: DULoxetine (CYMBALTA) 30 MG capsule  Hypertension associated with diabetes ([place]) - Plan: carvedilol (COREG) 12.5 MG tablet  Surgical wound present  Chronic, unstable.  Stop Zoloft 50 mg/d.  Start Cymbalta 30 mg daily. Chronic, unstable. Increase Coreg to 12.5 mg bid, cont hydralazine 100 mg TID, holding AM and noon dosage on HD days. Counseled on diet/exercise.  No evidence of infection.  Ice, Tylenol, hydrocodone as ordered by his surgeon.  Add a Medrol Dosepak to decrease inflammation.  He has an injection coming up.  Needs to keep the area clean and dry.  We will dress it today. F/u in 1 month to recheck above. The patient/spouse through the interpreter voiced understanding and agreement to the plan.  [place], DO 10/06/22 11:28 AM

## 2022-10-06 NOTE — Patient Instructions (Addendum)
This does not look infected right now.   When you do wash it, use only soap and water. Do not vigorously scrub. Keep the area clean and dry.   Things to look out for: increasing pain not relieved by ibuprofen/acetaminophen, fevers, spreading redness, drainage of pus, or foul odor.  Keep the diet clean and stay active.  Make sure your surgeon knows about your pain.   Let us know if you need anything.

## 2022-10-11 ENCOUNTER — Other Ambulatory Visit (HOSPITAL_BASED_OUTPATIENT_CLINIC_OR_DEPARTMENT_OTHER): Payer: Self-pay

## 2022-10-11 NOTE — Progress Notes (Deleted)
Patient presents today to pick up diabetic shoes and insoles.  Patient was dispensed 1 pair of diabetic shoes and 3 pairs of foam casted diabetic insoles.   *** tried on the shoes with the insoles and the fit was satisfactory.   Will follow up next year for new order.

## 2022-10-12 ENCOUNTER — Other Ambulatory Visit (HOSPITAL_BASED_OUTPATIENT_CLINIC_OR_DEPARTMENT_OTHER): Payer: Self-pay

## 2022-10-12 MED ORDER — LIDOCAINE 5 % EX PTCH
MEDICATED_PATCH | CUTANEOUS | 1 refills | Status: DC
  Filled 2022-10-12: qty 30, 30d supply, fill #0
  Filled 2022-11-09: qty 30, 30d supply, fill #1

## 2022-10-13 ENCOUNTER — Other Ambulatory Visit: Payer: Self-pay

## 2022-10-13 ENCOUNTER — Ambulatory Visit: Payer: Medicare Other | Admitting: Podiatry

## 2022-10-13 ENCOUNTER — Emergency Department (HOSPITAL_BASED_OUTPATIENT_CLINIC_OR_DEPARTMENT_OTHER)
Admission: EM | Admit: 2022-10-13 | Discharge: 2022-10-13 | Disposition: A | Discharge status: Home / Self Care | Payer: 59 | Attending: Emergency Medicine | Admitting: Emergency Medicine

## 2022-10-13 ENCOUNTER — Encounter (HOSPITAL_BASED_OUTPATIENT_CLINIC_OR_DEPARTMENT_OTHER): Payer: Self-pay

## 2022-10-13 ENCOUNTER — Other Ambulatory Visit: Payer: Medicaid Other

## 2022-10-13 ENCOUNTER — Other Ambulatory Visit (HOSPITAL_BASED_OUTPATIENT_CLINIC_OR_DEPARTMENT_OTHER): Payer: Self-pay

## 2022-10-13 DIAGNOSIS — E871 Hypo-osmolality and hyponatremia: Secondary | ICD-10-CM | POA: Insufficient documentation

## 2022-10-13 DIAGNOSIS — M545 Low back pain, unspecified: Secondary | ICD-10-CM | POA: Diagnosis present

## 2022-10-13 DIAGNOSIS — N186 End stage renal disease: Secondary | ICD-10-CM | POA: Insufficient documentation

## 2022-10-13 DIAGNOSIS — Z794 Long term (current) use of insulin: Secondary | ICD-10-CM | POA: Insufficient documentation

## 2022-10-13 DIAGNOSIS — M2042 Other hammer toe(s) (acquired), left foot: Secondary | ICD-10-CM

## 2022-10-13 DIAGNOSIS — E1122 Type 2 diabetes mellitus with diabetic chronic kidney disease: Secondary | ICD-10-CM | POA: Diagnosis not present

## 2022-10-13 DIAGNOSIS — Z7901 Long term (current) use of anticoagulants: Secondary | ICD-10-CM | POA: Diagnosis not present

## 2022-10-13 DIAGNOSIS — Z992 Dependence on renal dialysis: Secondary | ICD-10-CM | POA: Diagnosis not present

## 2022-10-13 DIAGNOSIS — Z9889 Other specified postprocedural states: Secondary | ICD-10-CM | POA: Insufficient documentation

## 2022-10-13 DIAGNOSIS — E1040 Type 1 diabetes mellitus with diabetic neuropathy, unspecified: Secondary | ICD-10-CM

## 2022-10-13 DIAGNOSIS — I509 Heart failure, unspecified: Secondary | ICD-10-CM | POA: Diagnosis not present

## 2022-10-13 LAB — CBC
HCT: 30.7 % — ABNORMAL LOW (ref 39.0–52.0)
Hemoglobin: 9.6 g/dL — ABNORMAL LOW (ref 13.0–17.0)
MCH: 30.7 pg (ref 26.0–34.0)
MCHC: 31.3 g/dL (ref 30.0–36.0)
MCV: 98.1 fL (ref 80.0–100.0)
Platelets: 202 10*3/uL (ref 150–400)
RBC: 3.13 MIL/uL — ABNORMAL LOW (ref 4.22–5.81)
RDW: 17 % — ABNORMAL HIGH (ref 11.5–15.5)
WBC: 5.1 10*3/uL (ref 4.0–10.5)
nRBC: 0 % (ref 0.0–0.2)

## 2022-10-13 LAB — BASIC METABOLIC PANEL
Anion gap: 11 (ref 5–15)
BUN: 38 mg/dL — ABNORMAL HIGH (ref 8–23)
CO2: 30 mmol/L (ref 22–32)
Calcium: 7.4 mg/dL — ABNORMAL LOW (ref 8.9–10.3)
Chloride: 90 mmol/L — ABNORMAL LOW (ref 98–111)
Creatinine, Ser: 6.2 mg/dL — ABNORMAL HIGH (ref 0.61–1.24)
GFR, Estimated: 9 mL/min — ABNORMAL LOW (ref >60)
Glucose, Bld: 96 mg/dL (ref 70–99)
Potassium: 4.1 mmol/L (ref 3.5–5.1)
Sodium: 131 mmol/L — ABNORMAL LOW (ref 135–145)

## 2022-10-13 LAB — LACTIC ACID, PLASMA: Lactic Acid, Venous: 0.8 mmol/L (ref 0.5–1.9)

## 2022-10-13 MED ORDER — HYDROCODONE-ACETAMINOPHEN 10-325 MG PO TABS
1.0000 | ORAL_TABLET | ORAL | 0 refills | Status: DC
  Filled 2022-10-13: qty 40, 6d supply, fill #0

## 2022-10-13 MED ORDER — OXYCODONE-ACETAMINOPHEN 5-325 MG PO TABS
2.0000 | ORAL_TABLET | Freq: Once | ORAL | Status: AC
  Administered 2022-10-13: 2 via ORAL
  Filled 2022-10-13: qty 2

## 2022-10-13 MED ORDER — HYDROMORPHONE HCL 1 MG/ML IJ SOLN
1.0000 mg | Freq: Once | INTRAMUSCULAR | Status: AC
  Administered 2022-10-13: 1 mg via INTRAVENOUS
  Filled 2022-10-13: qty 1

## 2022-10-13 NOTE — ED Notes (Signed)
Pt states he feels dizzy after his IV was out  PA notified

## 2022-10-13 NOTE — Discharge Instructions (Signed)
Continue to use the pain medication that was just represcribed by Dr. Trenton Gammon to the pharmacy in our facility, and follow-up with his office this week.  Please continue to monitor for any signs of developing infection of the low back.

## 2022-10-13 NOTE — ED Triage Notes (Addendum)
Pt c/o continued lower back pain following a lumbar laminectomy/decompression microdiscectomy on 12/19.  Wife reports he has followed up w/ the surgeon twice and was told everything is healing well.    Wife reports Pt only takes tylenol w/ no relief.  Sts he is "out of pain medicine."

## 2022-10-13 NOTE — ED Provider Notes (Signed)
[facility] Provider Note   CSN: [id number] Arrival date & time: 10/13/22  1034     History  Chief Complaint  Patient presents with   Back Pain    Kenneth [name] is a 67 y.o. male with past medical history significant for ESRD on dialysis, chronic CHF, NSTEMI, diabetes with recent lumbar laminectomy/decompression microdiscectomy on 12/19 who presents with concern for ongoing back pain.  Wife reports they followed up with the surgeon twice and was told everything is healing well.  There is a picture in the chart from 12/23 with appropriately healing wound with some soft tissue swelling, and appropriate granulation tissue.  Patient ran out of his prescribed Norco and has just been taking Tylenol without relief.  He does endorse some decreased strength of bilateral lower extremities but denies any unilateral deficits, saddle anesthesia, fever, chills.  They report some small blood and yellowish discharge when changing the bandage, on exam yellowish appearance appears to be granulation tissue to my eye.  Endorses intermittent incontinence of urine, doesn't urinate much at baseline, hx of dialysis, reports weakness worsening.    Back Pain      Home Medications Prior to Admission medications   Medication Sig Start Date End Date Taking? Authorizing Provider  apixaban (ELIQUIS) 5 MG TABS tablet Take 1 tablet (5 mg total) by mouth 2 (two) times daily. 08/14/22   Shelda Pal, DO  atorvastatin (LIPITOR) 80 MG tablet Take 1 tablet (80 mg total) by mouth daily. 10/16/21   Larey Dresser, MD  calcium acetate (PHOSLO) 667 MG capsule Take 667 mg by mouth 3 (three) times daily with meals.  07/09/17   [provider]  carvedilol (COREG) 12.5 MG tablet Take 1 tablet (12.5 mg total) by mouth 2 (two) times daily with a meal. 10/06/22   Wendling, Crosby Oyster, DO  Continuous Blood Gluc Sensor (FREESTYLE LIBRE 2 SENSOR) MISC 1 Device by Does not  apply route every 14 (fourteen) days. 10/28/21   Shamleffer, Melanie Crazier, MD  Continuous Blood Gluc Sensor (GUARDIAN SENSOR 3) MISC 1 Device by Does not apply route as directed. 06/05/22   Shamleffer, Melanie Crazier, MD  Continuous Blood Gluc Transmit (GUARDIAN LINK 3 TRANSMITTER) MISC 1 Device by Does not apply route as directed. 06/05/22   Shamleffer, Melanie Crazier, MD  Darbepoetin Alfa (ARANESP, ALBUMIN FREE, IJ) Darbepoetin Alfa (Aranesp) 04/27/22 04/26/23  [provider]  diclofenac Sodium (VOLTAREN) 1 % GEL Apply 4 grams topically 4 (four) times daily. 09/16/21   Shelda Pal, DO  DULoxetine (CYMBALTA) 30 MG capsule Take 1 capsule (30 mg total) by mouth daily. 10/06/22   Shelda Pal, DO  ezetimibe (ZETIA) 10 MG tablet Take 1 tablet by mouth daily Patient taking differently: Take 10 mg by mouth daily. 05/08/22   Larey Dresser, MD  glucose blood [facility] VERIO) test strip 1 each by Other route in the morning, at noon, in the evening, and at bedtime. Use as instructed 01/01/22   Shamleffer, Melanie Crazier, MD  heparin 1000 unit/mL SOLN injection Heparin Sodium (Porcine) 1,000 Units/mL Catheter Lock Venous 04/27/22 04/26/23  [provider]  hydrALAZINE (APRESOLINE) 100 MG tablet Take 1 tablet (100 mg total) by mouth 3 (three) times daily. DO NOT TAKE AM AND NOON DOSES ON HD DAYS 09/16/21   Shelda Pal, DO  HYDROcodone-acetaminophen [facility]) 10-325 MG tablet Take 1 tablet by mouth every 4-6 hours as needed for pain. 10/13/22  HYDROcodone-acetaminophen (NORCO/VICODIN) 5-325 MG tablet Take 1 tablet by mouth every 4 (four) hours as needed. 09/15/22     Insulin Lispro-aabc (LYUMJEV) 100 UNIT/ML SOLN Max Daily 30 units per insulin pump 01/06/22   Shamleffer, Melanie Crazier, MD  Insulin Pen Needle (PEN NEEDLES) 32G X 6 MM MISC Use as directed 3 times daily 12/18/21   Shamleffer, Melanie Crazier, MD  isosorbide mononitrate (IMDUR) 60 MG 24 hr tablet Take  1&1/2 tablets (90 mg total) by mouth daily. Hold on days of dialysis on Mon, Wed, Fri. 02/10/22   Larey Dresser, MD  lidocaine (LIDODERM) 5 % Apply 1 patch to skin as directed every day (May wear up to 12 hours.) 10/11/22     megestrol (MEGACE ES) 625 MG/5ML suspension Take 5 mLs (625 mg total) by mouth daily. 03/03/22   Shelda Pal, DO  Methoxy PEG-Epoetin Beta (MIRCERA IJ) Inject as directed.    [provider]  methylPREDNISolone (MEDROL DOSEPAK) 4 MG TBPK tablet Follow instructions on package. 10/06/22   Shelda Pal, DO  pantoprazole (PROTONIX) 40 MG tablet Take 1 tablet (40 mg total) by mouth 2 (two) times daily. 07/13/22   Hunsucker, Bonna Gains, MD  Sodium Zirconium Cyclosilicate (LOKELMA PO) Take 1 packet by mouth 4 (four) times a week. Hold on dialysis days    [provider]  Tiotropium Bromide-Olodaterol (STIOLTO RESPIMAT) 2.5-2.5 MCG/ACT AERS Inhale 2 puffs by mouth into the lungs daily. 01/20/22   Hunsucker, Bonna Gains, MD      Allergies    Patient has no known allergies.    Review of Systems   Review of Systems  Musculoskeletal:  Positive for back pain.  All other systems reviewed and are negative.   Physical Exam Updated Vital Signs BP (!) 97/38   Pulse 64   Temp 98.3 F (36.8 C) (Oral)   Resp 20   Wt 62.6 kg   SpO2 98%   BMI 22.96 kg/m  Physical Exam Vitals and nursing note reviewed.  Constitutional:      General: He is not in acute distress.    Appearance: Normal appearance.  HENT:     Head: Normocephalic and atraumatic.  Eyes:     General:        Right eye: No discharge.        Left eye: No discharge.  Cardiovascular:     Rate and Rhythm: Normal rate and regular rhythm.     Heart sounds: No murmur heard.    No friction rub. No gallop.  Pulmonary:     Effort: Pulmonary effort is normal.     Breath sounds: Normal breath sounds.  Abdominal:     General: Bowel sounds are normal.     Palpations: Abdomen is soft.   Musculoskeletal:     Comments: Globally bilateral lower extremities, 3 out of 5, intact strength 5/5 bilateral upper extremities.  Examination of surgical site in lower back shows appropriately healing surgical scar with some soft tissue swelling, bruising, and appropriate granulation tissue.  There is no purulent drainage, fluctuance, or evidence of abscess formation on my exam.  Skin:    General: Skin is warm and dry.     Capillary Refill: Capillary refill takes less than 2 seconds.  Neurological:     Mental Status: He is alert and oriented to person, place, and time.  Psychiatric:        Mood and Affect: Mood normal.        Behavior: Behavior normal.  ED Results / Procedures / Treatments   Labs (all labs ordered are listed, but only abnormal results are displayed) Labs Reviewed  CBC - Abnormal; Notable for the following components:      Result Value   RBC 3.13 (*)    Hemoglobin 9.6 (*)    HCT 30.7 (*)    RDW 17.0 (*)    All other components within normal limits  BASIC METABOLIC PANEL - Abnormal; Notable for the following components:   Sodium 131 (*)    Chloride 90 (*)    BUN 38 (*)    Creatinine, Ser 6.20 (*)    Calcium 7.4 (*)    GFR, Estimated 9 (*)    All other components within normal limits  LACTIC ACID, PLASMA  LACTIC ACID, PLASMA  URINALYSIS, ROUTINE W REFLEX MICROSCOPIC    EKG None  Radiology No results found.  Procedures Procedures    Medications Ordered in ED Medications  oxyCODONE-acetaminophen (PERCOCET/ROXICET) 5-325 MG per tablet 2 tablet (2 tablets Oral Given 10/13/22 1214)  HYDROmorphone (DILAUDID) injection 1 mg (1 mg Intravenous Given 10/13/22 1430)    ED Course/ Medical Decision Making/ A&P                             Medical Decision Making Amount and/or Complexity of Data Reviewed Labs: ordered.  Risk Prescription drug management.   This patient is a 66 y.o. male  who presents to the ED for concern of back pain, need for  pain control, with questionable worsening weakness, intermittent urinary incontinence.   Differential diagnoses prior to evaluation: The emergent differential diagnosis includes, but is not limited to, epidural abscess, osteomyelitis, other surgical complication from laminectomy, discectomy versus typical postsurgical pain, additionally considered cauda equina or other acute spinal cord impingement versus infection.. This is not an exhaustive differential.   Past Medical History / Co-morbidities: ESRD on dialysis, chronic CHF, NSTEMI, diabetes  Additional history: Chart reviewed. Pertinent results include: Reviewed surgical note from Dr. Trenton Gammon from December with laminectomy, discectomy  Physical Exam: Physical exam performed. The pertinent findings include: On my exam patient does have some weakness secondary to pain bilateral lower extremities but can move feet without difficulty, has intact sensation bilateral lower extremities.  He has normal coordination throughout.  He can stand on his own after pain control, weakness improved after pain control improved.  He was having some episodes of hypotension around time of discharge, however after resting he had multiple consecutive MAP greater than 65 with both sitting and standing, slightly more hypotensive with standing, some concern for orthostatic hypotension.  Patient had already had IV removed at this point, we performed shared decision making regarding additional workup or treatment, however especially as patient is going to dialysis tomorrow and has ESRD at baseline I do not think that IV fluid repletion or additional workup is likely to significantly improve his blood pressure at this time.  Encouraged him to hold his blood pressure medication tonight, monitor for persistent dizziness, or persistently worsening blood pressure, and to return to the emergency department if any concern for worsening blood pressure or  Lab Tests/Imaging studies: I  personally interpreted labs/imaging and the pertinent results include: Patient with mild hyponatremia, sodium 131, he has signs of ESRD with hypocalcemia, elevated BUN, creatinine.  Lactic acid normal x 1.  CBC overall unremarkable, he has some anemia with hemoglobin 9.6 stable compared to his baseline.  Considered additional advanced imaging with MRI  given his reported weakness, questionable incontinence, however after discussion with Dr. Trenton Gammon patient will follow-up urgently in neurosurgical office as an outpatient, it is difficult to ascertain the validity of his incontinence, however as he is ESRD he reports that he is still making some urine, he has no evidence of urinary retention, he is moving both of his legs spontaneously, as well as both of his feet.  Overall low clinical suspicion for acute cauda equina or other surgical lumbar spinal emergency at this time.  I agree with the radiologist interpretation.   Medications: I ordered medication including Dilaudid, Percocet for pain.  I have reviewed the patients home medicines and have made adjustments as needed.  Refill of his home pain medication was prescribed by Dr. Trenton Gammon prior to his discharge   Consultation:  Spoke with Dr. Trenton Gammon with neurosurgery who after review of clinical media showing the appearance of his postsurgical infection today and describing his current symptoms feels that patient's symptoms are likely related to postsurgical pain, with low clinical suspicion for postsurgical infection, and he recommends follow-up in the office this week.  Disposition: After consideration of the diagnostic results and the patients response to treatment, I feel that Patient is having some postsurgical pain of the low back, overall with low clinical suspicion for postsurgical infection, however I do think that he would benefit from close follow-up, after speaking with his neurosurgeon we have arranged close follow-up in the office this week, we have  significantly improved his pain in the emergency department and patient with no significant remaining weakness after pain control. Marland Kitchen   emergency department workup does not suggest an emergent condition requiring admission or immediate intervention beyond what has been performed at this time. The plan is: as above. The patient is safe for discharge and has been instructed to return immediately for worsening symptoms, change in symptoms or any other concerns.  Final Clinical Impression(s) / ED Diagnoses Final diagnoses:  Back pain with history of spinal surgery    Rx / DC Orders ED Discharge Orders     None         Anselmo Pickler, PA-C 10/13/22 1736    Audley Hose, MD 10/19/22 0000

## 2022-10-22 ENCOUNTER — Other Ambulatory Visit (HOSPITAL_BASED_OUTPATIENT_CLINIC_OR_DEPARTMENT_OTHER): Payer: Self-pay

## 2022-10-22 ENCOUNTER — Other Ambulatory Visit (HOSPITAL_BASED_OUTPATIENT_CLINIC_OR_DEPARTMENT_OTHER): Payer: Self-pay | Admitting: Neurosurgery

## 2022-10-22 ENCOUNTER — Ambulatory Visit: Payer: 59 | Admitting: Nurse Practitioner

## 2022-10-22 DIAGNOSIS — M48062 Spinal stenosis, lumbar region with neurogenic claudication: Secondary | ICD-10-CM

## 2022-10-22 MED ORDER — HYDROCODONE-ACETAMINOPHEN 10-325 MG PO TABS
1.0000 | ORAL_TABLET | ORAL | 0 refills | Status: DC | PRN
  Filled 2022-10-22: qty 40, 7d supply, fill #0

## 2022-10-24 ENCOUNTER — Ambulatory Visit (HOSPITAL_BASED_OUTPATIENT_CLINIC_OR_DEPARTMENT_OTHER): Payer: 59

## 2022-10-24 ENCOUNTER — Other Ambulatory Visit (HOSPITAL_BASED_OUTPATIENT_CLINIC_OR_DEPARTMENT_OTHER): Payer: Self-pay | Admitting: Neurosurgery

## 2022-10-24 ENCOUNTER — Ambulatory Visit (HOSPITAL_BASED_OUTPATIENT_CLINIC_OR_DEPARTMENT_OTHER)
Admission: RE | Admit: 2022-10-24 | Discharge: 2022-10-24 | Disposition: A | Discharge status: Home / Self Care | Payer: 59 | Source: Ambulatory Visit | Attending: Neurosurgery | Admitting: Neurosurgery

## 2022-10-24 DIAGNOSIS — M48062 Spinal stenosis, lumbar region with neurogenic claudication: Secondary | ICD-10-CM | POA: Diagnosis not present

## 2022-10-25 ENCOUNTER — Other Ambulatory Visit: Payer: Self-pay | Admitting: Family Medicine

## 2022-10-25 ENCOUNTER — Other Ambulatory Visit (HOSPITAL_COMMUNITY): Payer: Self-pay | Admitting: Cardiology

## 2022-10-25 ENCOUNTER — Other Ambulatory Visit (HOSPITAL_BASED_OUTPATIENT_CLINIC_OR_DEPARTMENT_OTHER): Payer: Self-pay

## 2022-10-26 ENCOUNTER — Other Ambulatory Visit (HOSPITAL_BASED_OUTPATIENT_CLINIC_OR_DEPARTMENT_OTHER): Payer: Self-pay

## 2022-10-26 MED ORDER — HYDRALAZINE HCL 100 MG PO TABS
100.0000 mg | ORAL_TABLET | Freq: Three times a day (TID) | ORAL | 2 refills | Status: DC
  Filled 2022-10-26: qty 270, 90d supply, fill #0
  Filled 2023-03-14: qty 270, 90d supply, fill #1
  Filled 2023-08-11: qty 270, 90d supply, fill #2

## 2022-10-26 MED ORDER — ATORVASTATIN CALCIUM 80 MG PO TABS
80.0000 mg | ORAL_TABLET | Freq: Every day | ORAL | 3 refills | Status: DC
  Filled 2022-10-26: qty 90, 90d supply, fill #0
  Filled 2023-02-08: qty 90, 90d supply, fill #1
  Filled 2023-04-23: qty 90, 90d supply, fill #2
  Filled 2023-08-11: qty 90, 90d supply, fill #3

## 2022-10-27 ENCOUNTER — Other Ambulatory Visit (HOSPITAL_BASED_OUTPATIENT_CLINIC_OR_DEPARTMENT_OTHER): Payer: Self-pay

## 2022-10-27 MED ORDER — HYDROCODONE-ACETAMINOPHEN 10-325 MG PO TABS
1.0000 | ORAL_TABLET | ORAL | 0 refills | Status: AC | PRN
  Filled 2022-10-27 – 2022-10-28 (×2): qty 60, 10d supply, fill #0

## 2022-10-28 ENCOUNTER — Other Ambulatory Visit (HOSPITAL_BASED_OUTPATIENT_CLINIC_OR_DEPARTMENT_OTHER): Payer: Self-pay

## 2022-10-29 ENCOUNTER — Other Ambulatory Visit: Payer: Self-pay

## 2022-11-03 ENCOUNTER — Other Ambulatory Visit (HOSPITAL_BASED_OUTPATIENT_CLINIC_OR_DEPARTMENT_OTHER): Payer: Self-pay

## 2022-11-03 ENCOUNTER — Ambulatory Visit: Payer: 59 | Admitting: Family Medicine

## 2022-11-03 ENCOUNTER — Telehealth: Payer: Self-pay

## 2022-11-03 NOTE — Telephone Encounter (Signed)
Request received through fax from [facility] for records request. Clinical notes have been faxed to [phone]

## 2022-11-04 ENCOUNTER — Ambulatory Visit (INDEPENDENT_AMBULATORY_CARE_PROVIDER_SITE_OTHER): Payer: 59 | Admitting: Family Medicine

## 2022-11-04 ENCOUNTER — Encounter: Payer: Self-pay | Admitting: Family Medicine

## 2022-11-04 ENCOUNTER — Ambulatory Visit: Payer: Medicaid Other | Admitting: Nurse Practitioner

## 2022-11-04 VITALS — BP 138/80 | HR 75 | Temp 97.8°F | Ht 65.0 in | Wt 130.5 lb

## 2022-11-04 DIAGNOSIS — G8918 Other acute postprocedural pain: Secondary | ICD-10-CM | POA: Diagnosis not present

## 2022-11-04 DIAGNOSIS — R634 Abnormal weight loss: Secondary | ICD-10-CM | POA: Diagnosis not present

## 2022-11-04 NOTE — Progress Notes (Signed)
Chief Complaint  Patient presents with   Follow-up    Subjective: Patient is a 67 y.o. male here for f/u wt loss. Here w spouse and family friend who helps interpret ([place]).   Pt has had 8 lb weight loss over past mo. Has not been intentional. He is hungry, but does not want to prepare a meal. Eats fairly healthy overall. Recently saw a dietician. No early satiety. He had a back surgery 2 mo ago and has had a lot of post op pain. This is affecting is PO intake as well. He is taking Norco 10-325 mg TID 2/2 pain.  He showers daily.  He reports squeezing his wound trying to take on the "white stuff" and also getting out blood.  No fevers or foul odor.  No reports of spreading redness.  He is keeping it bandaged.  Thus, it is maintaining a dry and clean status mostly.  Past Medical History:  Diagnosis Date   Arthritis    hands   CAD (coronary artery disease) 10/30/2016   NSTEMI 9/17 with CABG x 3 (LIMA-LAD, SVG-OM, SVG-RCA).   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  There is TTP.  There is granulation tissue between the wound edges. Lungs: CTAB, no rales, wheezes or rhonchi. No accessory muscle use Psych: Age appropriate judgment and insight, normal affect and mood  Assessment and Plan: Weight loss  Post-operative pain  Needs to prepare his own meals. This cannot be too high in protein or sodium given his renal function. He has seen a dietician.  Still having pain. Reviewed NS note.  I agree with Dr. Verl Dicker that there is no evidence of infection, thus antibiotics are not warranted.  I told the family and the interpreter that the white material they are seeing is granulation tissue, not pus.  Recommended cont pain med, wound management and make appt w PT.  The patient and spouse through the his friend/interpreter voiced understanding and agreement to the plan.  I spent 35 min w the patient and his family discussing the above plans in addition to reviewing his chart/outside consultant records on the same day of the visit.   [place], DO 11/04/22  3:37 PM

## 2022-11-04 NOTE — Patient Instructions (Addendum)
Keep the diet clean and stay active.  Stop touching the wound. There is no sign of infection now. Touching/manipulating the wound can slow down healing.   Please follow Dr. Irven Baltimore recommendations.   In your situation, do not skip meals. You need protein to help heal.   Healthy Eating Plan Many factors influence your heart health, including eating and exercise habits. Heart (coronary) risk increases with abnormal blood fat (lipid) levels. Heart-healthy meal planning includes limiting unhealthy fats, increasing healthy fats, and making other small dietary changes. This includes maintaining a healthy body weight to help keep lipid levels within a normal range.  WHAT IS MY PLAN?  Your health care provider recommends that you: Drink a glass of water before meals to help with satiety. Eat slowly. An alternative to the water is to add Metamucil. This will help with satiety as well. It does contain calories, unlike water.  WHAT TYPES OF FAT SHOULD I CHOOSE? Choose healthy fats more often. Choose monounsaturated and polyunsaturated fats, such as olive oil and canola oil, flaxseeds, walnuts, almonds, and seeds. Eat more omega-3 fats. Good choices include salmon, mackerel, sardines, tuna, flaxseed oil, and ground flaxseeds. Aim to eat fish at least two times each week. Avoid foods with partially hydrogenated oils in them. These contain trans fats. Examples of foods that contain trans fats are stick margarine, some tub margarines, cookies, crackers, and other baked goods. If you are going to avoid a fat, this is the one to avoid!  WHAT GENERAL GUIDELINES DO I NEED TO FOLLOW? Check food labels carefully to identify foods with trans fats. Avoid these types of options when possible. Fill one half of your plate with vegetables and green salads. Eat 4-5 servings of vegetables per day. A serving of vegetables equals 1 cup of raw leafy vegetables,  cup of raw or cooked cut-up vegetables, or  cup of vegetable  juice. Fill one fourth of your plate with whole grains. Look for the word "whole" as the first word in the ingredient list. Fill one fourth of your plate with lean protein foods. Eat 4-5 servings of fruit per day. A serving of fruit equals one medium whole fruit,  cup of dried fruit,  cup of fresh, frozen, or canned fruit. Try to avoid fruits in cups/syrups as the sugar content can be high. Eat more foods that contain soluble fiber. Examples of foods that contain this type of fiber are apples, broccoli, carrots, beans, peas, and barley. Aim to get 20-30 g of fiber per day. Eat more home-cooked food and less restaurant, buffet, and fast food. Limit or avoid alcohol. Limit foods that are high in starch and sugar. Avoid fried foods when able. Cook foods by using methods other than frying. Baking, boiling, grilling, and broiling are all great options. Other fat-reducing suggestions include: Removing the skin from poultry. Removing all visible fats from meats. Skimming the fat off of stews, soups, and gravies before serving them. Steaming vegetables in water or broth. Lose weight if you are overweight. Losing just 5-10% of your initial body weight can help your overall health and prevent diseases such as diabetes and heart disease. Increase your consumption of nuts, legumes, and seeds to 4-5 servings per week. One serving of dried beans or legumes equals  cup after being cooked, one serving of nuts equals 1 ounces, and one serving of seeds equals  ounce or 1 tablespoon.  WHAT ARE GOOD FOODS CAN I EAT? Grains Grainy breads (try to find bread that is  3 g of fiber per slice or greater), oatmeal, light popcorn. Whole-grain cereals. Rice and pasta, including brown rice and those that are made with whole wheat. Edamame pasta is a great alternative to grain pasta. It has a higher protein content. Try to avoid significant consumption of white bread, sugary cereals, or pastries/baked  goods.  Vegetables All vegetables. Cooked white potatoes do not count as vegetables.  Fruits All fruits, but limit pineapple and bananas as these fruits have a higher sugar content.  Meats and Other Protein Sources Lean, well-trimmed beef, veal, pork, and lamb. Chicken and [place] without skin. All fish and shellfish. Wild duck, rabbit, pheasant, and venison. Egg whites or low-cholesterol egg substitutes. Dried beans, peas, lentils, and tofu. Seeds and most nuts.  Dairy Low-fat or nonfat cheeses, including ricotta, string, and mozzarella. Skim or 1% milk that is liquid, powdered, or evaporated. Buttermilk that is made with low-fat milk. Nonfat or low-fat yogurt. Soy/Almond milk are good alternatives if you cannot handle dairy.  Beverages Water is the best for you. Sports drinks with less sugar are more desirable unless you are a highly active athlete.  Sweets and Desserts Sherbets and fruit ices. Honey, jam, marmalade, jelly, and syrups. Dark chocolate.  Eat all sweets and desserts in moderation.  Fats and Oils Nonhydrogenated (trans-free) margarines. Vegetable oils, including soybean, sesame, sunflower, olive, peanut, safflower, corn, canola, and cottonseed. Salad dressings or mayonnaise that are made with a vegetable oil. Limit added fats and oils that you use for cooking, baking, salads, and as spreads.  Other Cocoa powder. Coffee and tea. Most condiments.  The items listed above may not be a complete list of recommended foods or beverages. Contact your dietitian for more options.

## 2022-11-05 ENCOUNTER — Encounter: Payer: Self-pay | Admitting: Nurse Practitioner

## 2022-11-05 ENCOUNTER — Other Ambulatory Visit (INDEPENDENT_AMBULATORY_CARE_PROVIDER_SITE_OTHER): Payer: 59

## 2022-11-05 ENCOUNTER — Ambulatory Visit (INDEPENDENT_AMBULATORY_CARE_PROVIDER_SITE_OTHER): Payer: 59 | Admitting: Nurse Practitioner

## 2022-11-05 VITALS — BP 154/60 | HR 72 | Ht 63.0 in | Wt 136.5 lb

## 2022-11-05 DIAGNOSIS — N186 End stage renal disease: Secondary | ICD-10-CM | POA: Diagnosis not present

## 2022-11-05 DIAGNOSIS — R195 Other fecal abnormalities: Secondary | ICD-10-CM | POA: Diagnosis not present

## 2022-11-05 DIAGNOSIS — D631 Anemia in chronic kidney disease: Secondary | ICD-10-CM | POA: Diagnosis not present

## 2022-11-05 DIAGNOSIS — N189 Chronic kidney disease, unspecified: Secondary | ICD-10-CM

## 2022-11-05 LAB — CBC
HCT: 31.7 % — ABNORMAL LOW (ref 39.0–52.0)
Hemoglobin: 10.4 g/dL — ABNORMAL LOW (ref 13.0–17.0)
MCHC: 32.8 g/dL (ref 30.0–36.0)
MCV: 92.3 fl (ref 78.0–100.0)
Platelets: 154 10*3/uL (ref 150.0–400.0)
RBC: 3.43 Mil/uL — ABNORMAL LOW (ref 4.22–5.81)
RDW: 17.2 % — ABNORMAL HIGH (ref 11.5–15.5)
WBC: 4.8 10*3/uL (ref 4.0–10.5)

## 2022-11-05 LAB — IBC + FERRITIN
Ferritin: 254.1 ng/mL (ref 22.0–322.0)
Iron: 51 ug/dL (ref 42–165)
Saturation Ratios: 23.1 % (ref 20.0–50.0)
TIBC: 221.2 ug/dL — ABNORMAL LOW (ref 250.0–450.0)
Transferrin: 158 mg/dL — ABNORMAL LOW (ref 212.0–360.0)

## 2022-11-05 NOTE — Patient Instructions (Addendum)
??? ??? ???? ??? ?? ??? ?? ??? ??? ?? ????? ??? ??????. ??????? "  B"? ????. ???? ??????? ??? ?? ? ?? ?? ????.  ?????? ???? ?? ???? ???? ??????.  ?? ??? ?? ?? ???? ?? ??? ?????.  Further recommendations to be determined after lab results.  Thank you for trusting me with your gastrointestinal care!   Carl Best, CRNP

## 2022-11-05 NOTE — Progress Notes (Signed)
11/05/2022 MING YOUNGERS [id number] 06/19/1956   CHIEF COMPLAINT: + FOBT, anemia   HISTORY OF PRESENT ILLNESS: Humza Marsiglia. Lamm is a 67 year old [name] with a past medical history of depression, arthritis, hypertension, hyperlipidemia, coronary artery disease s/p NSTEMI and 3 vessel CABG x 3 in 2017 and NSTEMI s/p DES 06/2017, CHF, paroxysmal atrial fibrillation on Eliquis, diabetes mellitus type, ESRD on hemodialysis MWF, s/p AV fistula revision, chronic anemia, lower GI bleed 12/2020 and colon polyps.   He presents today as referred by Dr. Riki Sheer for further evaluation a positive FOBT 09/2022 with a drop in his globin level.  He is accompanied by a [place] health interpreter and his wife. He had persistent nausea and vomited clear emesis once about 1 month ago. No coffee-ground or frank hematemesis. No further vomiting since then and his nausea has significantly improved but has not completely abated. He denies having any upper or lower abdominal pain.  No heartburn or dysphagia.  He is taking Pantoprazole 40 mg twice daily. He passes a formed bowel movement 2 to 3 days, he does not look at his stool therefore he cannot verify if he is passing any red blood from the rectum or black stools. He sometimes sits on the commode for a few hours for he passes a BM.  No anorectal pain. He remains on Eliquis for history of A-fib.  Hemoglobin was 12.23 May 2022 then dropped down to 7.22 August 2022. His most recent CBC 10/13/2022 showed a hemoglobin level of 9.6. See lab result summary below.   I initially met Jonathyn Bacus during his hospital admission due to having anemia with a positive FOBT 12/2020. At that time, his admission hemoglobin level was 6.8 the baseline hemoglobin around 10. He received at least 1 unit of PRBCs during this admission.  He underwent an EGD and colonoscopy as an inpatient 4/7/202022. The EGD showed erythematous mucosa in the stomach and duodenopathy. Gastric biopsies  showed nonspecific reactive gastropathy and focal intestinal metaplasia of oxyntic mucosa negative for dysplasia. The colonoscopy identified blood in the entire colon without obvious source for GI bleed and 1 hyperplastic polyp was removed from the sigmoid colon. Internal and external hemorrhoids were noted.  He was advised to repeat a colonoscopy in 10 years. A small bowel capsule endoscopy was unrevealing.  It was suspected that he likely had an obscure colonic AVM regarding etiology for his lower GI bleed.   He has shortness of breath for the past several months.  No cough or hemoptysis.  No chest pain.      Latest Ref Rng & Units 10/13/2022    2:24 PM 09/02/2022   12:22 PM 09/01/2022    9:55 AM  CBC  WBC 4.0 - 10.5 K/uL 5.1  6.2    Hemoglobin 13.0 - 17.0 g/dL 9.6  7.9  9.9   Hematocrit 39.0 - 52.0 % 30.7  24.5  29.0   Platelets 150 - 400 K/uL 202  127      06/02/2022: Hg 12.9 08/14/2022: Hg 11.5. 09/01/2022: Hg 9.9. 09/02/2022: Hg 7.9 10/13/2022: Hg 9.6     Latest Ref Rng & Units 10/13/2022    2:24 PM 09/02/2022   12:22 PM 09/01/2022    9:55 AM  CMP  Glucose 70 - 99 mg/dL 96  315  188   BUN 8 - 23 mg/dL 38  67  37   Creatinine 0.61 - 1.24 mg/dL 6.20  9.06  7.00   Sodium 135 -  145 mmol/L 131  133  134   Potassium 3.5 - 5.1 mmol/L 4.1  4.8  4.7   Chloride 98 - 111 mmol/L 90  92  94   CO2 22 - 32 mmol/L 30  26    Calcium 8.9 - 10.3 mg/dL 7.4  8.2       PAST GI PROCEDURES:  EGD 12/19/2020 by Dr. Fuller Plan as an inpatient: Normal esophagus Erythematous mucosa in the stomach.  Biopsied. Erythematous duodenopathy. Duodenal deformity. A. STOMACH, BIOPSY:  - Gastric antral mucosa with mild nonspecific reactive gastropathy  - Gastric oxyntic mucosa with parietal cell hyperplasia as can be seen  in hypergastrinemic states such as PPI therapy.  - Focal intestinal metaplasia of oxyntic mucosa, negative for dysplasia  - Warthin Starry stain is negative for Helicobacter pylori    Colonoscopy 12/20/2020: Blood in the entire examined colon.  No obvious source of lower GI bleed after extensive lavage and suction. 1 less than 1 mm hyperplastic polyp in the sigmoid colon, removed with a cold biopsy forcep.  Resected and retrieved. Nonbleeding external and internal hemorrhoids. Repeat colonoscopy in 5 to 10 years  Small bowel capsule endoscopy: Small bowel video capsule study shows the capsule passing quickly into the duodenum.  There is fair visualization of the small bowel since there is retained fluid and debris.  Overall that is typical for video capsule study.  Relatively fast transit time about 2 hours and 25 minutes into the colon, at which time no fresh or old blood was seen in the right colon.  Little or no mucosal visualization of the colon is obtained on this study.  There was no fresh or old blood throughout the small bowel.  No mucosal abnormality such as AVM tumor or inflammation were visualized in the small bowel.   Past Medical History:  Diagnosis Date   Arthritis    hands   CAD (coronary artery disease) 10/30/2016   NSTEMI 9/17 with CABG x 3 (LIMA-LAD, SVG-OM, SVG-RCA).   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   Dyspnea    with exertion   ESRD on hemodialysis ([place])    "MWF; [place] [facility]" (03/30/2018)   Gastroesophageal reflux disease 08/18/2016   Hyperlipidemia    Hypertension    Ischemic rest pain of lower extremity 02/06/2015   Keratoma 02/27/2015   Metatarsal deformity 02/27/2015   PAF (paroxysmal atrial fibrillation) ([facility])    Pronation deformity of both feet 02/27/2015   Past Surgical History:  Procedure Laterality Date   A/V FISTULAGRAM Left 03/06/2021   Procedure: A/V FISTULAGRAM;  Surgeon: Marty Heck, MD;  Location: [facility];  Service: Cardiovascular;  Laterality:  Left;   A/V FISTULAGRAM Left 06/02/2022   Procedure: A/V Fistulagram;  Surgeon: Serafina Mitchell, MD;  Location: [facility];  Service: Cardiovascular;  Laterality: Left;   AV FISTULA PLACEMENT Left 06/22/2016   Procedure: ARTERIOVENOUS (AV) FISTULA CREATION LEFT UPPER ARM;  Surgeon: Rosetta Posner, MD;  Location: [facility];  Service: Vascular;  Laterality: Left;   BIOPSY  12/19/2020   Procedure: BIOPSY;  Surgeon: Ladene Artist, MD;  Location: [facility];  Service: Endoscopy;;   CARDIAC CATHETERIZATION N/A 05/31/2016   Procedure: Left Heart Cath and Coronary Angiography;  Surgeon: Jettie Booze, MD;  Location: [facility]  INVASIVE [facility];  Service: Cardiovascular;  Laterality: N/A;   CARDIAC CATHETERIZATION N/A 05/31/2016   Procedure: Right Heart Cath;  Surgeon: Jettie Booze, MD;  Location: [facility];  Service: Cardiovascular;  Laterality: N/A;   CARDIAC CATHETERIZATION N/A 05/31/2016   Procedure: IABP Insertion;  Surgeon: Jettie Booze, MD;  Location: [facility];  Service: Cardiovascular;  Laterality: N/A;   COLONOSCOPY WITH PROPOFOL N/A 12/20/2020   Procedure: COLONOSCOPY WITH PROPOFOL;  Surgeon: Mauri Pole, MD;  Location: [facility];  Service: Endoscopy;  Laterality: N/A;   CORONARY ARTERY BYPASS GRAFT N/A 06/05/2016   Procedure: CORONARY ARTERY BYPASS GRAFTING (CABG) x3 LIMA to LAD -SVG to OM -SVG to RCA;  Surgeon: Ivin Poot, MD;  Location: [facility];  Service: Open Heart Surgery;  Laterality: N/A;   CORONARY STENT INTERVENTION N/A 07/07/2017   Procedure: CORONARY STENT INTERVENTION;  Surgeon: Wellington Hampshire, MD;  Location: [facility];  Service: Cardiovascular;  Laterality: N/A;   CORONARY STENT INTERVENTION N/A 03/30/2018   Procedure: CORONARY STENT INTERVENTION;  Surgeon: Martinique, Peter M, MD;  Location: [facility];  Service: Cardiovascular;  Laterality: N/A;   ESOPHAGOGASTRODUODENOSCOPY (EGD) WITH PROPOFOL N/A 12/19/2020   Procedure:  ESOPHAGOGASTRODUODENOSCOPY (EGD) WITH PROPOFOL;  Surgeon: Ladene Artist, MD;  Location: [facility];  Service: Endoscopy;  Laterality: N/A;   EXCHANGE OF A DIALYSIS CATHETER Right 04/16/2022   Procedure: EXCHANGE OF A DIALYSIS CATHETER;  Surgeon: Cherre Robins, MD;  Location: [facility];  Service: Vascular;  Laterality: Right;   GIVENS CAPSULE STUDY N/A 12/20/2020   Procedure: GIVENS CAPSULE STUDY;  Surgeon: Mauri Pole, MD;  Location: [facility];  Service: Endoscopy;  Laterality: N/A;   INSERTION OF DIALYSIS CATHETER N/A 06/05/2016   Procedure: INSERTION OF DIALYSIS/trialysis CATHETER;  Surgeon: Ivin Poot, MD;  Location: [facility];  Service: Vascular;  Laterality: N/A;   INSERTION OF DIALYSIS CATHETER Right 06/13/2016   Procedure: INSERTION OF DIALYSIS CATHETER RIGHT INTERNAL JUGULAR;  Surgeon: Conrad Stonewall, MD;  Location: [facility];  Service: Vascular;  Laterality: Right;   INSERTION OF DIALYSIS CATHETER Right 09/07/2019   Procedure: Insertion Of Dialysis Catheter;  Surgeon: Rosetta Posner, MD;  Location: [facility];  Service: Vascular;  Laterality: Right;   INSERTION OF DIALYSIS CATHETER N/A 04/09/2022   Procedure: INSERTION OF TUNNELED DIALYSIS CATHETER;  Surgeon: Cherre Robins, MD;  Location: [facility];  Service: Vascular;  Laterality: N/A;   INTRAOPERATIVE TRANSESOPHAGEAL ECHOCARDIOGRAM N/A 06/05/2016   Procedure: INTRAOPERATIVE TRANSESOPHAGEAL ECHOCARDIOGRAM;  Surgeon: Ivin Poot, MD;  Location: [facility] [place];  Service: Open Heart Surgery;  Laterality: N/A;   LEFT HEART CATH AND CORS/GRAFTS ANGIOGRAPHY N/A 11/03/2016   Procedure: Left Heart Cath and Cors/Grafts Angiography;  Surgeon: Troy Sine, MD;  Location: [facility];  Service: Cardiovascular;  Laterality: N/A;   LEFT HEART CATH AND CORS/GRAFTS ANGIOGRAPHY N/A 07/07/2017   Procedure: LEFT HEART CATH AND CORS/GRAFTS ANGIOGRAPHY;  Surgeon: Larey Dresser, MD;  Location: [place] [facility];  Service: Cardiovascular;  Laterality: N/A;    LEFT HEART CATH AND CORS/GRAFTS ANGIOGRAPHY N/A 03/30/2018   Procedure: LEFT HEART CATH AND CORS/GRAFTS ANGIOGRAPHY;  Surgeon: Martinique, Peter M, MD;  Location: [facility] [facility];  Service: Cardiovascular;  Laterality: N/A;   LEFT HEART CATH AND CORS/GRAFTS ANGIOGRAPHY N/A 04/17/2019   Procedure: LEFT HEART CATH AND CORS/GRAFTS ANGIOGRAPHY;  Surgeon: Larey Dresser, MD;  Location: [facility] [facility];  Service: Cardiovascular;  Laterality: N/A;   LUMBAR LAMINECTOMY/DECOMPRESSION MICRODISCECTOMY Bilateral 09/01/2022   Procedure: Laminectomy and Foraminotomy - bilateral - Lumbar three-Lumbar four;  Surgeon: Earnie Larsson, MD;  Location: [facility];  Service: Neurosurgery;  Laterality: Bilateral;   PERIPHERAL VASCULAR BALLOON ANGIOPLASTY Left 06/02/2022   Procedure: PERIPHERAL VASCULAR BALLOON ANGIOPLASTY;  Surgeon: Serafina Mitchell, MD;  Location: [facility];  Service: Cardiovascular;  Laterality: Left;   PERIPHERAL VASCULAR INTERVENTION Left 03/06/2021   Procedure: PERIPHERAL VASCULAR INTERVENTION;  Surgeon: Marty Heck, MD;  Location: [facility];  Service: Cardiovascular;  Laterality: Left;   POLYPECTOMY  12/20/2020   Procedure: POLYPECTOMY;  Surgeon: Mauri Pole, MD;  Location: [facility];  Service: Endoscopy;;   REVISION OF ARTERIOVENOUS GORETEX GRAFT Left [id number]   Procedure: PLICATION OF ANEURYSM OF ARTERIOVENOUS FISTULA  LEFT ARM;  Surgeon: Rosetta Posner, MD;  Location: [facility];  Service: Vascular;  Laterality: Left;   REVISON OF ARTERIOVENOUS FISTULA Left 09/07/2019   Procedure: REVISON OF LEFT UPPER ARM ARTERIOVENOUS FISTULA;  Surgeon: Rosetta Posner, MD;  Location: [facility];  Service: Vascular;  Laterality: Left;   REVISON OF ARTERIOVENOUS FISTULA Left 04/09/2022   Procedure: LEFT ARM FISTULA REVISION;  Surgeon: Cherre Robins, MD;  Location: [facility];  Service: Vascular;  Laterality: Left;  PERIPHERAL NERVE BLOCK   REVISON OF ARTERIOVENOUS FISTULA Left 04/16/2022    Procedure: REVISON OF ARTERIOVENOUS FISTULA;  Surgeon: Cherre Robins, MD;  Location: [facility];  Service: Vascular;  Laterality: Left;   RIGHT/LEFT HEART CATH AND CORONARY ANGIOGRAPHY N/A 01/03/2021   Procedure: RIGHT/LEFT HEART CATH AND CORONARY ANGIOGRAPHY;  Surgeon: Larey Dresser, MD;  Location: [facility];  Service: Cardiovascular;  Laterality: N/A;   Social History: He smoked cigarettes 1 - 2ppd x 30+ years, quit smoking cigarettes 10 years ago. No alcohol use. No drug use.   Family History: Father diagnosed with colon cancer 102's.  No known family history of esophageal or gastric cancer.   No Active Allergies   Outpatient Encounter Medications as of 11/05/2022  Medication Sig   apixaban (ELIQUIS) 5 MG TABS tablet Take 1 tablet (5 mg total) by mouth 2 (two) times daily.   atorvastatin (LIPITOR) 80 MG tablet Take 1 tablet (80 mg total) by mouth daily.   calcium acetate (PHOSLO) 667 MG capsule Take 667 mg by mouth 3 (three) times daily with meals.    carvedilol (COREG) 12.5 MG tablet Take 1 tablet (12.5 mg total) by mouth 2 (two) times daily with a meal.   Continuous Blood Gluc Sensor (FREESTYLE LIBRE 2 SENSOR) MISC 1 Device by Does not apply route every 14 (fourteen) days.   Continuous Blood Gluc Sensor (GUARDIAN SENSOR 3) MISC 1 Device by Does not apply route as directed.   Continuous Blood Gluc Transmit (GUARDIAN LINK 3 TRANSMITTER) MISC 1 Device by Does not apply route as directed.   Darbepoetin Alfa (ARANESP, ALBUMIN FREE, IJ) Darbepoetin Alfa (Aranesp)   diclofenac Sodium (VOLTAREN) 1 % GEL Apply 4 grams topically 4 (four) times daily.   DULoxetine (CYMBALTA) 30 MG capsule Take 1 capsule (30 mg total) by mouth daily.   ezetimibe (ZETIA) 10 MG tablet Take 1 tablet by mouth daily (Patient taking differently: Take 10 mg by mouth daily.)   glucose blood (ONETOUCH VERIO) test strip 1 each by Other route in the morning, at noon, in the evening, and at bedtime. Use as instructed    heparin 1000 unit/mL SOLN injection  Heparin Sodium (Porcine) 1,000 Units/mL Catheter Lock Venous   hydrALAZINE (APRESOLINE) 100 MG tablet Take 1 tablet (100 mg total) by mouth 3 (three) times daily. DO NOT TAKE AM AND NOON DOSES ON HD DAYS.   HYDROcodone-acetaminophen (NORCO) 10-325 MG tablet Take 1 tablet by mouth every 4 to 6 hours as needed for pain.   Insulin Lispro-aabc (LYUMJEV) 100 UNIT/ML SOLN Max Daily 30 units per insulin pump   Insulin Pen Needle (PEN NEEDLES) 32G X 6 MM MISC Use as directed 3 times daily   isosorbide mononitrate (IMDUR) 60 MG 24 hr tablet Take 1&1/2 tablets (90 mg total) by mouth daily. Hold on days of dialysis on Mon, Wed, Fri.   lidocaine (LIDODERM) 5 % Apply 1 patch to skin as directed every day (May wear up to 12 hours.)   megestrol (MEGACE ES) 625 MG/5ML suspension Take 5 mLs (625 mg total) by mouth daily.   Methoxy PEG-Epoetin Beta (MIRCERA IJ) Inject as directed.   pantoprazole (PROTONIX) 40 MG tablet Take 1 tablet (40 mg total) by mouth 2 (two) times daily.   Sodium Zirconium Cyclosilicate (LOKELMA PO) Take 1 packet by mouth 4 (four) times a week. Hold on dialysis days   Tiotropium Bromide-Olodaterol (STIOLTO RESPIMAT) 2.5-2.5 MCG/ACT AERS Inhale 2 puffs by mouth into the lungs daily.   No facility-administered encounter medications on file as of 11/05/2022.   REVIEW OF SYSTEMS:  Gen: Denies fever, sweats or chills. No weight loss.  CV: Denies chest pain, palpitations or edema. Resp: Denies cough, shortness of breath of hemoptysis.  GI: See HPI. GU : ESRD on HD. MS: Denies joint pain, muscles aches or weakness. Derm: Denies rash, itchiness, skin lesions or unhealing ulcers. Psych: Denies depression, anxiety, memory loss or confusion. Heme: Denies bruising, easy bleeding. Neuro:  Denies headaches, dizziness or paresthesias. Endo:  + Diabetes on an insulin pump.  PHYSICAL EXAM: Ht 5' 3"$  (1.6 m) Comment: height measured without shoes  Wt 136 lb 8 oz  (61.9 kg)   BMI 24.18 kg/m  General: 67 year old male in no acute distress. Head: Normocephalic and atraumatic. Eyes:  Sclerae non-icteric, conjunctive pink. Ears: Normal auditory acuity. Mouth: Dentition intact. No ulcers or lesions.  Neck: Supple, no lymphadenopathy or thyromegaly.  Lungs: Clear bilaterally to auscultation without wheezes, crackles or rhonchi. Heart: Regular rate and rhythm. No murmur, rub or gallop appreciated.  Abdomen: Soft, nontender, nondistended. No masses. No hepatosplenomegaly. Normoactive bowel sounds x 4 quadrants.  Rectal: Deferred.  Musculoskeletal: Symmetrical with no gross deformities. Skin: Warm and dry. No rash or lesions on visible extremities. Extremities: LUE AV graft with + bruit and thrill.  Neurological: Alert oriented x 4, no focal deficits.  Psychological:  Alert and cooperative. Normal mood and affect.  ASSESSMENT AND PLAN:  67 year old male with a history of GI bleed in 2022 presents with anemia and positive FOBT.  Patient denies overt GI bleeding, however, he does not look at his stool. Recent nausea with nonbloody emesis x 1 episode one month ago. No abdominal or anorectal pain. He was admitted to the hospital 12/2020 acute on chronic anemia. S/P EGD 12/19/2020 showed a normal esophagus and stomach, erythematous duodenopathy and a moderate post-ulcer deformity was found in the duodenal bulb. Gastric biopsies showed focal intestinal metaplasia without dysplasia and no evidence of H. Pylori. Colonoscopy 12/20/2020 identified blood in the entire colon without obvious source of lower GI bleed.  A < 1 mm polyp in the sigmoid colon was removed and nonbleeding internal and external  hemorrhoids. Small bowel capsule endoscopy 4/8 without evidence of fresh or old blood throughout the small bowel. GI bleed thought to be due to possible obscure colonic AVM.  -CBC, CMP, IBC + ferritin panel -Defer endoscopic recommendations to Dr. Loletha Carrow -Patient to monitor his  stool color and to contact our office if he sees blood with his bowel movements or black stools-patient was instructed to go to the ED if he develops chest pain, worsening shortness of breath, hematochezia or black stools  GERD Continue Pantoprazole 40 mg twice daily  Constipation Take Miralax 1 capful mixed in 8 ounces of water at bed time for constipation as tolerated.  Atrial fibrillation on Eliquis  Coronary artery disease, MI x 2, s/p CABG and DES   ESRD on HD  Chronic diastolic CHF. ECHO with LV EF 60 -65% per ECHO 07/24/2021  Diabetes on insulin pump   SOB, chronic  -Patient instructed to contact his cardiologist for follow-up          CC:  Shelda Pal*

## 2022-11-06 ENCOUNTER — Telehealth: Payer: Self-pay

## 2022-11-06 LAB — COMPREHENSIVE METABOLIC PANEL
ALT: 8 U/L (ref 0–53)
AST: 17 U/L (ref 0–37)
Albumin: 3.7 g/dL (ref 3.5–5.2)
Alkaline Phosphatase: 107 U/L (ref 39–117)
BUN: 21 mg/dL (ref 6–23)
CO2: 33 mEq/L — ABNORMAL HIGH (ref 19–32)
Calcium: 8.3 mg/dL — ABNORMAL LOW (ref 8.4–10.5)
Chloride: 93 mEq/L — ABNORMAL LOW (ref 96–112)
Creatinine, Ser: 5.74 mg/dL (ref 0.40–1.50)
GFR: 9.71 mL/min — CL (ref >60.00)
Glucose, Bld: 290 mg/dL — ABNORMAL HIGH (ref 70–99)
Potassium: 4.7 mEq/L (ref 3.5–5.1)
Sodium: 135 mEq/L (ref 135–145)
Total Bilirubin: 0.4 mg/dL (ref 0.2–1.2)
Total Protein: 6.8 g/dL (ref 6.0–8.3)

## 2022-11-06 NOTE — Telephone Encounter (Signed)
Patient has ESRD on HD

## 2022-11-06 NOTE — Telephone Encounter (Signed)
Received call from lab to report Critical value: Creatinine 5.74 GFR 9.71

## 2022-11-09 ENCOUNTER — Other Ambulatory Visit (HOSPITAL_COMMUNITY): Payer: Self-pay

## 2022-11-09 NOTE — Progress Notes (Signed)
Kenneth [name], pls contact the patient and provide him with Dr. Loletha Carrow' recommendations per his addendum to my office note.  You will need to contact a [place] interpreter.  -Please schedule the patient for an EGD and colonoscopy with Dr. Loletha Carrow in the hospital outpatient endoscopy lab (due to ESRD on HD) on 12/15/22.  That Tuesday slot in a hospital outpatient block, will fit in with his Monday Wednesday Friday dialysis schedule. -Split dose GoLytely prep -Contact his cardiologist verify if okay to hold Eliquis 48 hours prior to his procedure date -Contact which ever physician manages his insulin pump to give direct advice to the patient regarding its management while he is on a clear liquid diet during prep day and n.p.o. after midnight the day of procedures.   Thank you for coordinating all of this.   [name], I am adding you to this so that we do not duplicate our efforts.

## 2022-11-09 NOTE — Progress Notes (Signed)
____________________________________________________________  Attending physician addendum:  Thank you for sending this case to me. I have reviewed the entire note and agree with the plan.  EGD and colonoscopy with me in the hospital outpatient endoscopy lab (due to ESRD on HD) on 12/15/22.  That is my next available Tuesday slot in a hospital outpatient block, which will fit in with his Monday Wednesday Friday dialysis schedule. Split dose GoLytely prep Hold Eliquis 48 hours before procedure if cardiology agreeable Will need which ever physician manages his insulin pump to give direct advice to the patient regarding its management while he is on a clear liquid diet during prep day and n.p.o. after midnight the day of procedures.  Wilfrid Lund, MD  ____________________________________________________________

## 2022-11-10 ENCOUNTER — Other Ambulatory Visit: Payer: Self-pay

## 2022-11-10 ENCOUNTER — Telehealth: Payer: Self-pay

## 2022-11-10 ENCOUNTER — Other Ambulatory Visit (HOSPITAL_BASED_OUTPATIENT_CLINIC_OR_DEPARTMENT_OTHER): Payer: Self-pay

## 2022-11-10 ENCOUNTER — Encounter: Payer: Self-pay | Admitting: Podiatry

## 2022-11-10 ENCOUNTER — Ambulatory Visit (INDEPENDENT_AMBULATORY_CARE_PROVIDER_SITE_OTHER): Payer: 59 | Admitting: Podiatry

## 2022-11-10 DIAGNOSIS — M79674 Pain in right toe(s): Secondary | ICD-10-CM

## 2022-11-10 DIAGNOSIS — B351 Tinea unguium: Secondary | ICD-10-CM

## 2022-11-10 DIAGNOSIS — N186 End stage renal disease: Secondary | ICD-10-CM

## 2022-11-10 DIAGNOSIS — M79675 Pain in left toe(s): Secondary | ICD-10-CM

## 2022-11-10 DIAGNOSIS — Q828 Other specified congenital malformations of skin: Secondary | ICD-10-CM

## 2022-11-10 DIAGNOSIS — R195 Other fecal abnormalities: Secondary | ICD-10-CM

## 2022-11-10 DIAGNOSIS — D631 Anemia in chronic kidney disease: Secondary | ICD-10-CM

## 2022-11-10 DIAGNOSIS — M1990 Unspecified osteoarthritis, unspecified site: Secondary | ICD-10-CM

## 2022-11-10 DIAGNOSIS — M2041 Other hammer toe(s) (acquired), right foot: Secondary | ICD-10-CM

## 2022-11-10 DIAGNOSIS — E1040 Type 1 diabetes mellitus with diabetic neuropathy, unspecified: Secondary | ICD-10-CM | POA: Diagnosis not present

## 2022-11-10 DIAGNOSIS — M2042 Other hammer toe(s) (acquired), left foot: Secondary | ICD-10-CM

## 2022-11-10 NOTE — Telephone Encounter (Signed)
Pt scheduled for EGD/ Colonoscopy with Dr. Loletha Carrow on 12/15/2022 at [facility] on 12/15/2022 at 8:15 AM:  Case Number [id number]: Unable to get connected to [place]. Will try tomorrow and contact pt.

## 2022-11-10 NOTE — Telephone Encounter (Signed)
Message Received: Tracie Harrier, Patrecia Pour, NP  Gillermina Hu, RN; Yevette Edwards, RN      Previous Messages  Routed Note  Author: Noralyn Pick, NP Service: Gastroenterology Author Type: Nurse Practitioner  Filed: 11/09/2022  5:05 PM Encounter Date: 11/05/2022 Status: Signed  Editor: Noralyn Pick, NP (Nurse Practitioner)  Remo Lipps, pls contact the patient and provide him with Dr. Loletha Carrow' recommendations per his addendum to my office note.  You will need to contact a [place] interpreter.   -Please schedule the patient for an EGD and colonoscopy with Dr. Loletha Carrow in the hospital outpatient endoscopy lab (due to ESRD on HD) on 12/15/22.  That Tuesday slot in a hospital outpatient block, will fit in with his Monday Wednesday Friday dialysis schedule. -Split dose GoLytely prep -Contact his cardiologist verify if okay to hold Eliquis 48 hours prior to his procedure date -Contact which ever physician manages his insulin pump to give direct advice to the patient regarding its management while he is on a clear liquid diet during prep day and n.p.o. after midnight the day of procedures.   Thank you for coordinating all of this.    [name], I am adding you to this so that we do not duplicate our efforts.      Office Visit for Anemia; Bloated; Gastroesophageal Reflux 11/05/2022 Noralyn Pick, NP - [facility] Gastroenterology Diagnoses  Anemia Of Chronic Renal Failure, Unspecified Ckd Stage (Primary) Positive fecal occult blood test ESRD (end stage renal disease) ([place]) Orders Signed This Visit  (3) IBC + Ferritin Comp Met (CMET) CBC Orders Pended This Visit  None Progress Notes  Noralyn Pick, NP at 11/05/2022  2:30 PM  Status: Signed  Remo Lipps, pls contact the patient and provide him with Dr. Loletha Carrow' recommendations per his addendum to my office note.  You will need to contact a [place] interpreter.    -Please schedule the patient for an EGD and colonoscopy with Dr. Loletha Carrow in the hospital outpatient endoscopy lab (due to ESRD on HD) on 12/15/22.  That Tuesday slot in a hospital outpatient block, will fit in with his Monday Wednesday Friday dialysis schedule. -Split dose GoLytely prep -Contact his cardiologist verify if okay to hold Eliquis 48 hours prior to his procedure date -Contact which ever physician manages his insulin pump to give direct advice to the patient regarding its management while he is on a clear liquid diet during prep day and n.p.o. after midnight the day of procedures.   Thank you for coordinating all of this.    [name], I am adding you to this so that we do not duplicate our efforts.     Doran Stabler, MD at 11/05/2022  2:30 PM  Status: Signed  ____________________________________________________________   Attending physician addendum:   Thank you for sending this case to me. I have reviewed the entire note and agree with the plan.   EGD and colonoscopy with me in the hospital outpatient endoscopy lab (due to ESRD on HD) on 12/15/22.  That is my next available Tuesday slot in a hospital outpatient block, which will fit in with his Monday Wednesday Friday dialysis schedule. Split dose GoLytely prep Hold Eliquis 48 hours before procedure if cardiology agreeable Will need which ever physician manages his insulin pump to give direct advice to the patient regarding its management while he is on a clear liquid diet during prep day and n.p.o. after midnight the day of procedures.   Mallie Mussel  Loletha Carrow, MD   ____________________________________________________________       Noralyn Pick, NP at 11/05/2022  2:30 PM

## 2022-11-10 NOTE — Progress Notes (Signed)
This patient returns to my office for at risk foot care.  This patient requires this care by a professional since this patient will be at risk due to having ESRD and DM.  This patient is unable to cut nails himself since the patient cannot reach his nails.These nails are painful walking and wearing shoes. He presents to pick up his diabetic shoes.  This patient presents for at risk foot care today.  General Appearance  Alert, conversant and in no acute stress.  Vascular  Dorsalis pedis and posterior tibial  pulses are palpable  bilaterally.  Capillary return is within normal limits  bilaterally. Temperature is within normal limits  bilaterally.  Neurologic  Senn-Weinstein monofilament wire test diminished  bilaterally. Muscle power within normal limits bilaterally.  Nails Thick disfigured discolored nails with subungual debris  from hallux to fifth toes bilaterally. No evidence of bacterial infection or drainage bilaterally.  Orthopedic  No limitations of motion  feet .  No crepitus or effusions noted.  Hammer toes   B/L  HAV  B/L. Plantar flexed fifth metatarsal  B/L  Skin  normotropic skin noted bilaterally.  No signs of infections or ulcers noted.   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  Onychomycosis  Pain in right toes  Pain in left toes  Porokeratosis  B/L  Consent was obtained for treatment procedures.   Mechanical debridement of nails 1-5  bilaterally performed with a nail nipper.  Filed with dremel without incident. Dispense diabetic shoes.  Debride porokeratosis with dremel tool.   Return office visit   12  weeks                   Told patient to return for periodic foot care and evaluation due to potential at risk complications.   Gardiner Barefoot DPM      Gardiner Barefoot DPM

## 2022-11-11 ENCOUNTER — Telehealth: Payer: Self-pay

## 2022-11-11 ENCOUNTER — Other Ambulatory Visit: Payer: Self-pay

## 2022-11-11 ENCOUNTER — Other Ambulatory Visit (HOSPITAL_BASED_OUTPATIENT_CLINIC_OR_DEPARTMENT_OTHER): Payer: Self-pay

## 2022-11-11 DIAGNOSIS — R195 Other fecal abnormalities: Secondary | ICD-10-CM

## 2022-11-11 DIAGNOSIS — D631 Anemia in chronic kidney disease: Secondary | ICD-10-CM

## 2022-11-11 MED ORDER — PEG 3350-KCL-NA BICARB-NACL 420 G PO SOLR
4000.0000 mL | Freq: Once | ORAL | 0 refills | Status: AC
  Filled 2022-11-11: qty 4000, 1d supply, fill #0

## 2022-11-11 NOTE — Telephone Encounter (Unsigned)
[facility] Pre-operative Risk Assessment     Kenneth [name] 04-05-1956 [id number]  Procedure: Upper Endoscopy/Colonoscopy Anesthesia type:  MAC Procedure Date: 12/15/2022 Provider: Dr. Loletha Carrow  Type of Clearance needed: Pharmacy  Medication(s) needing held: Eliquis   Length of time for medication to be held: 2 days prior  Please review request and advise by either responding to this message or by sending your response to the fax # provided below.  Thank you,  [facility]  Phone: [phone] Fax: [phone] ATTENTION: Gillermina Hu RN

## 2022-11-11 NOTE — Telephone Encounter (Signed)
Through an [name] Pt wife [name] notified of Carl Best NP and Dr. Loletha Carrow recommendations: Pt scheduled for EGD/ Colonoscopy with Dr. Loletha Carrow on 12/15/2022 at [facility] on 12/15/2022 at 8:15 AM: [name] made aware Ambulatory referral to GI placed in Epic.  Prep instructions were created for pt and sent to pt via my chart and via mail. [name] made aware: Prescription sent to pharmacy. [name] made aware Message sent to pt Cardiologist through epic  to verify if ok to hold Eliquis 48 hours prior to his procedure. [name] made aware Message sent to Pt Endocrinologist through epic to request direct advice to the pt on his insulin pump while he is on a clear liquid diet during prep day and npo after midnight the day of procedures. [name] made aware [name] verbalized understanding with all questions answered.

## 2022-11-11 NOTE — Telephone Encounter (Signed)
Pt planned to have EGD/Colonoscopy on 12/15/2022 with Dr. Loletha Carrow at [facility]  Please provide direct advice to the patient regarding his insulin pump  management while he is on a clear liquid diet during prep day and n.p.o. after midnight the day of procedures.

## 2022-11-12 NOTE — Telephone Encounter (Signed)
   Patient Name: Kenneth [name]  DOB: [birth year] MRN: [id number]  Primary Cardiologist: None  Chart reviewed as part of pre-operative protocol coverage. Pre-op clearance already addressed by colleagues in earlier phone notes. To summarize recommendations:  - Ok to hold Eliquis as requested.  -Dr. Aundra Dubin  Will route this bundled recommendation to requesting provider via Epic fax function and remove from pre-op pool. Please call with questions.  Elgie Collard, PA-C 11/12/2022, 11:08 AM

## 2022-11-13 ENCOUNTER — Telehealth: Payer: Self-pay

## 2022-11-13 NOTE — Telephone Encounter (Unsigned)
Through a [name] pt wife [name]  was made aware that the cardiologist stated to hold Eliquis 2 days prior to procedure.  And that the Dr. Who manages his insulin pump will discuss the management at his upcoming office visit.  [name] stated that he is currently MWF dialysis and after next week he will be Tues/Thur/Sat dialysis days. And he will have to reschedule to procedure. [name] notified I will review Dr. Loletha Carrow  hospital dates and call her back with Dr Loletha Carrow Availability  Pt verbalized understanding with all questions answered.

## 2022-11-17 ENCOUNTER — Ambulatory Visit: Payer: 59

## 2022-11-17 NOTE — Telephone Encounter (Signed)
Please see note below. Dr. Loletha Carrow first availability in hospital to accommodate pt and his new dialysis schedule is Monday July 1st.  Should I proceed with this date:

## 2022-11-17 NOTE — Telephone Encounter (Signed)
Kenneth [name], I believe I forwarded Dr. Loletha Carrow' recommendations per addendum to patient's office visit to you previously which included the following  "EGD and colonoscopy with me in the hospital outpatient endoscopy lab (due to ESRD on HD) on 12/15/22.  That is my next available Tuesday slot in a hospital outpatient block, which will fit in with his Monday Wednesday Friday dialysis schedule. Split dose GoLytely prep Hold Eliquis 48 hours before procedure if cardiology agreeable Will need which ever physician manages his insulin pump to give direct advice to the patient regarding its management while he is on a clear liquid diet during prep day and n.p.o. after midnight the day of procedures.

## 2022-11-18 ENCOUNTER — Other Ambulatory Visit (HOSPITAL_BASED_OUTPATIENT_CLINIC_OR_DEPARTMENT_OTHER): Payer: Self-pay

## 2022-11-18 NOTE — Telephone Encounter (Signed)
This is becoming increasingly complicated due to the patient's change in dialysis schedule and limited dates available for hospital-based endoscopic procedures.  I do not want this procedure to wait until July if possible since it is for anemia with concern of GI bleeding.  Before we completely cancel it on that day, I think it would be helpful to communicate with this patient's dialysis physician and see if they can do one of the following:   - Delay the change in his dialysis schedule until after his endoscopic procedures  Or   - Make an adjustment to his dialysis schedule around the date of his April 2nd procedures that he can be dialyzed the day before.  Nephrology has typically been able to make such accommodations in the past.  Annie Main, please contact [facility] and find out the physician managing this patient's dialysis and best contact number.  Colleen, please communicate with that physician I discussed the above with them.   Let me know if you need my assistance.  - HD

## 2022-11-18 NOTE — Telephone Encounter (Signed)
Pt wife contacted to locate pt dialysis center and receive Dr's name.  Pt goes to [facility]) [phone]. Dr Corliss Parish is his nephrologist. Representative at [facility] stated that Dr. Moshe Cipro is in the building today and visits twice a month. Dr. Moshe Cipro office number at [facility] is  [phone]

## 2022-11-19 ENCOUNTER — Other Ambulatory Visit: Payer: Self-pay

## 2022-11-19 ENCOUNTER — Other Ambulatory Visit (HOSPITAL_BASED_OUTPATIENT_CLINIC_OR_DEPARTMENT_OTHER): Payer: Self-pay

## 2022-11-19 DIAGNOSIS — D631 Anemia in chronic kidney disease: Secondary | ICD-10-CM

## 2022-11-19 DIAGNOSIS — R195 Other fecal abnormalities: Secondary | ICD-10-CM

## 2022-11-19 MED ORDER — PEG 3350-KCL-NA BICARB-NACL 420 G PO SOLR
4000.0000 mL | Freq: Once | ORAL | 0 refills | Status: AC
  Filled 2022-11-19: qty 4000, 1d supply, fill #0

## 2022-11-19 NOTE — Telephone Encounter (Signed)
Dr. Loletha Carrow, see msg from [place].

## 2022-11-19 NOTE — Telephone Encounter (Signed)
Kenneth [name], I spoke with Dr. Moshe Cipro today and explained the situation.  She agreed that this man's hemodialysis schedule can be adjusted to accommodate his planned EGD and colonoscopy on 12/15/22.  She happened to be at that dialysis center today while the patient was having his treatment, and she has arranged for Mr. Jaracz to have hemodialysis on Saturday, March 30 and again on Monday, April 1 so the patient can prep as planned for his procedures with [place] on April 2.  Please convey all this to the patient's wife, and thanks very much for all the extra effort with these complicated logistics.  - H. Joseph Pierini, fyi

## 2022-11-20 NOTE — Telephone Encounter (Signed)
Through a [name] Pt wife Kenneth [name]  was made aware of Dr. Loletha Carrow and Dr. Moshe Cipro recommendations: Kenneth [name] notified that it has been  arranged for Kenneth [name] to have hemodialysis on Saturday, March 30 and again on Monday, April 1 so he  can prep as planned for his procedures with [place] on April 2.  Prep sent to pharmacy: Kenneth [name] made aware  Prep instructions were created and translated into [place] and sent to pt via Mail. Kenneth [name] made aware.  Kenneth [name] verbalized understanding with all questions answered.

## 2022-11-24 ENCOUNTER — Ambulatory Visit: Payer: Medicare Other | Admitting: Internal Medicine

## 2022-11-24 ENCOUNTER — Ambulatory Visit: Payer: 59

## 2022-11-30 ENCOUNTER — Other Ambulatory Visit (HOSPITAL_BASED_OUTPATIENT_CLINIC_OR_DEPARTMENT_OTHER): Payer: Self-pay

## 2022-11-30 ENCOUNTER — Ambulatory Visit (INDEPENDENT_AMBULATORY_CARE_PROVIDER_SITE_OTHER): Payer: 59 | Admitting: Internal Medicine

## 2022-11-30 ENCOUNTER — Encounter: Payer: Self-pay | Admitting: Internal Medicine

## 2022-11-30 ENCOUNTER — Other Ambulatory Visit: Payer: Self-pay

## 2022-11-30 VITALS — BP 142/84 | Ht 63.0 in | Wt 127.0 lb

## 2022-11-30 DIAGNOSIS — E1142 Type 2 diabetes mellitus with diabetic polyneuropathy: Secondary | ICD-10-CM | POA: Diagnosis not present

## 2022-11-30 DIAGNOSIS — E1165 Type 2 diabetes mellitus with hyperglycemia: Secondary | ICD-10-CM | POA: Diagnosis not present

## 2022-11-30 DIAGNOSIS — Z794 Long term (current) use of insulin: Secondary | ICD-10-CM | POA: Diagnosis not present

## 2022-11-30 DIAGNOSIS — Z992 Dependence on renal dialysis: Secondary | ICD-10-CM

## 2022-11-30 DIAGNOSIS — N186 End stage renal disease: Secondary | ICD-10-CM

## 2022-11-30 DIAGNOSIS — E1122 Type 2 diabetes mellitus with diabetic chronic kidney disease: Secondary | ICD-10-CM

## 2022-11-30 LAB — POCT GLYCOSYLATED HEMOGLOBIN (HGB A1C): Hemoglobin A1C: 7.8 % — AB (ref 4.0–5.6)

## 2022-11-30 NOTE — Patient Instructions (Signed)

## 2022-11-30 NOTE — Progress Notes (Unsigned)
Name: Kenneth [name]  MRN/ DOB: [id number], 12/26/1955   Age/ Sex: 67 y.o., male    PCP: Shelda Pal, DO   Reason for Endocrinology Evaluation: Type 2 Diabetes Mellitus     Date of Initial Endocrinology Visit: 10/28/2021    PATIENT IDENTIFIER: Kenneth [name] is a 67 y.o. male with a past medical history of T2DM, CAD, CHF , HTN and ESRD on HD since 2017. The patient presented for initial endocrinology clinic visit on 10/28/2021 for consultative assistance with his diabetes management.    HPI: Kenneth [name] transitioned care from Dr. Hartford Poli . He is accompanied by his wife and interpreter    Diagnosed with DM years ago  > 20 yrs ago . Islet cell Ab negative 2021 Currently checking blood sugars multiple  x / day,  through freestyle libre   Hypoglycemia episodes : yes                Symptoms: shaking                  Frequency: 1/ week Hemoglobin A1c has ranged from 5.2% in 2022, peaking at 8.1% in 2021.   In terms of diet, the patient eats 2 meals a day. Snacks occasionally     Nephrology : Dr. Moshe Cipro . Pt on HD  Monday, Wednesday and Friday  Pulmonary : Dr. Silas Flood  10/2021 for dyspnea     SUBJECTIVE:   During the last visit (06/04/2022): A1c 9.0%       Today (11/30/22): Kenneth [name] is here for follow-up on diabetes management.  He is accompanied by his wife . Interpreter line was used .  He  checks his blood sugars multiple times daily, through CGM. The patient has had hypoglycemic episodes since the last clinic visit, he symptomatic with these episodes.   Pt follows with GI for anemia and is scheduled for EGD/colonoscopy 01/02/2023  Denies any recent nausea, vomiting or changes in his bowel movement  He was seen for pretransplant evaluation at [facility] kidney transplant, Gaspar Cola    This patient with type 2 diabetes is treated with Medtronic  (insulin pump). During the visit the pump basal and bolus doses were reviewed including carb/insulin rations and  supplemental doses. The clinical list was updated. The glucose meter download was reviewed in detail to determine if the current pump settings are providing the best glycemic control without excessive hypoglycemia.  Pump and meter download:        Pump   Medtronic 630 G Settings   Insulin type   Lyumjev   Basal rate       0000 0.475  u/h    0600 0.475 u/h    1800 0.575 u/h       I:C ratio       0000 1:10                  Sensitivity       0000  55      Goal       0000  110           Type & Model of Pump: medtronic  Insulin Type: Currently using lyumjev   PUMP STATISTICS: Average BG: 131 -/+ 68 BG Readings: 2.2/ day Average Daily Carbs (g): 0 Average Total Daily Insulin: 12.9 +/- 1.3 Average Daily Basal: 12.0(93 %) Average Daily Bolus: 0.9 (7 %)     CONTINUOUS GLUCOSE MONITORING RECORD INTERPRETATION    Dates of Recording: 3/5-3/18/2024  Sensor description:freestyle libre   Results statistics:   CGM use % of time 64  Average and SD 153/41.2  Time in range 57 %  % Time Above 180 30  % Time above 250 5  % Time Below target 5    Glycemic patterns summary:Hyperglycemia noted during the night  and trends  down during the day   Hyperglycemic episodes  postprandial   Hypoglycemic episodes occurred day and night   Overnight periods: high but trends down     HOME DIABETES REGIMEN: Lyumjev      Statin: yes  ACE-I/ARB: no      DIABETIC COMPLICATIONS: Microvascular complications:  ESRD on HD , neuropathy Denies: retinopathy  Last eye exam: Completed   Macrovascular complications:  CAD ( S/P CABG 2017) Denies: PVD, CVA   PAST HISTORY: Past Medical History:  Past Medical History:  Diagnosis Date   Arthritis    hands   CAD (coronary artery disease) 10/30/2016   NSTEMI 9/17 with CABG x 3 (LIMA-LAD, SVG-OM, SVG-RCA).   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   type 1 DM   Dyspnea    with exertion   ESRD on hemodialysis ([place])    "MWF; [facility]" (03/30/2018)   Gastroesophageal reflux disease 08/18/2016   Hyperlipidemia    Hypertension    Ischemic rest pain of lower extremity 02/06/2015   Keratoma 02/27/2015   Metatarsal deformity 02/27/2015   PAF (paroxysmal atrial fibrillation) ([facility])    Pronation deformity of both feet 02/27/2015   Past Surgical History:  Past Surgical History:  Procedure Laterality Date   A/V FISTULAGRAM Left 03/06/2021   Procedure: A/V FISTULAGRAM;  Surgeon: Marty Heck, MD;  Location: [place] [facility];  Service: Cardiovascular;  Laterality: Left;   A/V FISTULAGRAM Left 06/02/2022   Procedure: A/V Fistulagram;  Surgeon: Serafina Mitchell, MD;  Location: [place] [facility];  Service: Cardiovascular;  Laterality: Left;   AV FISTULA PLACEMENT Left 06/22/2016   Procedure: ARTERIOVENOUS (AV) FISTULA CREATION LEFT UPPER ARM;  Surgeon: Rosetta Posner, MD;  Location: [place];  Service: Vascular;  Laterality: Left;   BIOPSY  12/19/2020   Procedure: BIOPSY;  Surgeon: Ladene Artist, MD;  Location: [place];  Service: Endoscopy;;   CARDIAC CATHETERIZATION N/A 05/31/2016   Procedure: Left Heart Cath and Coronary Angiography;  Surgeon: Jettie Booze, MD;  Location: [place] [facility];  Service: Cardiovascular;  Laterality: N/A;   CARDIAC CATHETERIZATION N/A 05/31/2016   Procedure: Right Heart Cath;  Surgeon: Jettie Booze, MD;  Location: [place] [facility];  Service: Cardiovascular;  Laterality: N/A;   CARDIAC CATHETERIZATION N/A 05/31/2016   Procedure: IABP Insertion;  Surgeon: Jettie Booze, MD;  Location: [place] [facility];  Service: Cardiovascular;  Laterality: N/A;   COLONOSCOPY WITH PROPOFOL N/A 12/20/2020   Procedure: COLONOSCOPY WITH PROPOFOL;  Surgeon: Mauri Pole, MD;  Location: [place] [facility];  Service: Endoscopy;   Laterality: N/A;   CORONARY ARTERY BYPASS GRAFT N/A 06/05/2016   Procedure: CORONARY ARTERY BYPASS GRAFTING (CABG) x3 LIMA to LAD -SVG to OM -SVG to RCA;  Surgeon: Ivin Poot, MD;  Location: [place];  Service: Open Heart Surgery;  Laterality: N/A;   CORONARY STENT INTERVENTION N/A 07/07/2017   Procedure: CORONARY STENT INTERVENTION;  Surgeon: Wellington Hampshire, MD;  Location: [place] [facility];  Service: Cardiovascular;  Laterality: N/A;   CORONARY STENT INTERVENTION N/A 03/30/2018   Procedure: CORONARY STENT INTERVENTION;  Surgeon: Martinique, Peter M, MD;  Location: [place] [facility];  Service: Cardiovascular;  Laterality: N/A;   ESOPHAGOGASTRODUODENOSCOPY (EGD) WITH PROPOFOL N/A 12/19/2020   Procedure: ESOPHAGOGASTRODUODENOSCOPY (EGD) WITH PROPOFOL;  Surgeon: Ladene Artist, MD;  Location: [facility];  Service: Endoscopy;  Laterality: N/A;   EXCHANGE OF A DIALYSIS CATHETER Right 04/16/2022   Procedure: EXCHANGE OF A DIALYSIS CATHETER;  Surgeon: Cherre Robins, MD;  Location: [place];  Service: Vascular;  Laterality: Right;   GIVENS CAPSULE STUDY N/A 12/20/2020   Procedure: GIVENS CAPSULE STUDY;  Surgeon: Mauri Pole, MD;  Location: [place] [facility];  Service: Endoscopy;  Laterality: N/A;   INSERTION OF DIALYSIS CATHETER N/A 06/05/2016   Procedure: INSERTION OF DIALYSIS/trialysis CATHETER;  Surgeon: Ivin Poot, MD;  Location: [place];  Service: Vascular;  Laterality: N/A;   INSERTION OF DIALYSIS CATHETER Right 06/13/2016   Procedure: INSERTION OF DIALYSIS CATHETER RIGHT INTERNAL JUGULAR;  Surgeon: Conrad Catalina, MD;  Location: [place];  Service: Vascular;  Laterality: Right;   INSERTION OF DIALYSIS CATHETER Right 09/07/2019   Procedure: Insertion Of Dialysis Catheter;  Surgeon: Rosetta Posner, MD;  Location: [facility];  Service: Vascular;  Laterality: Right;   INSERTION OF DIALYSIS CATHETER N/A 04/09/2022   Procedure: INSERTION OF TUNNELED DIALYSIS CATHETER;  Surgeon: Cherre Robins, MD;  Location:  [place];  Service: Vascular;  Laterality: N/A;   INTRAOPERATIVE TRANSESOPHAGEAL ECHOCARDIOGRAM N/A 06/05/2016   Procedure: INTRAOPERATIVE TRANSESOPHAGEAL ECHOCARDIOGRAM;  Surgeon: Ivin Poot, MD;  Location: [place];  Service: Open Heart Surgery;  Laterality: N/A;   LEFT HEART CATH AND CORS/GRAFTS ANGIOGRAPHY N/A 11/03/2016   Procedure: Left Heart Cath and Cors/Grafts Angiography;  Surgeon: Troy Sine, MD;  Location: [place] [facility];  Service: Cardiovascular;  Laterality: N/A;   LEFT HEART CATH AND CORS/GRAFTS ANGIOGRAPHY N/A 07/07/2017   Procedure: LEFT HEART CATH AND CORS/GRAFTS ANGIOGRAPHY;  Surgeon: Larey Dresser, MD;  Location: [place] [facility];  Service: Cardiovascular;  Laterality: N/A;   LEFT HEART CATH AND CORS/GRAFTS ANGIOGRAPHY N/A 03/30/2018   Procedure: LEFT HEART CATH AND CORS/GRAFTS ANGIOGRAPHY;  Surgeon: Martinique, Peter M, MD;  Location: [place] [facility];  Service: Cardiovascular;  Laterality: N/A;   LEFT HEART CATH AND CORS/GRAFTS ANGIOGRAPHY N/A 04/17/2019   Procedure: LEFT HEART CATH AND CORS/GRAFTS ANGIOGRAPHY;  Surgeon: Larey Dresser, MD;  Location: [place] [facility];  Service: Cardiovascular;  Laterality: N/A;   LUMBAR LAMINECTOMY/DECOMPRESSION MICRODISCECTOMY Bilateral 09/01/2022   Procedure: Laminectomy and Foraminotomy - bilateral - Lumbar three-Lumbar four;  Surgeon: Earnie Larsson, MD;  Location: [place];  Service: Neurosurgery;  Laterality: Bilateral;   PERIPHERAL VASCULAR BALLOON ANGIOPLASTY Left 06/02/2022   Procedure: PERIPHERAL VASCULAR BALLOON ANGIOPLASTY;  Surgeon: Serafina Mitchell, MD;  Location: [place] [facility];  Service: Cardiovascular;  Laterality: Left;   PERIPHERAL VASCULAR INTERVENTION Left 03/06/2021   Procedure: PERIPHERAL VASCULAR INTERVENTION;  Surgeon: Marty Heck, MD;  Location: [place] [facility];  Service: Cardiovascular;  Laterality: Left;   POLYPECTOMY  12/20/2020   Procedure: POLYPECTOMY;  Surgeon: Mauri Pole, MD;  Location:  [place] [facility];  Service: Endoscopy;;   REVISION OF ARTERIOVENOUS GORETEX GRAFT Left [id number]   Procedure: PLICATION OF ANEURYSM OF ARTERIOVENOUS FISTULA  LEFT ARM;  Surgeon: Rosetta Posner, MD;  Location: [place];  Service: Vascular;  Laterality:  Left;   REVISON OF ARTERIOVENOUS FISTULA Left 09/07/2019   Procedure: REVISON OF LEFT UPPER ARM ARTERIOVENOUS FISTULA;  Surgeon: Rosetta Posner, MD;  Location: [place];  Service: Vascular;  Laterality: Left;   REVISON OF ARTERIOVENOUS FISTULA Left 04/09/2022   Procedure: LEFT ARM FISTULA REVISION;  Surgeon: Cherre Robins, MD;  Location: [place];  Service: Vascular;  Laterality: Left;  PERIPHERAL NERVE BLOCK   REVISON OF ARTERIOVENOUS FISTULA Left 04/16/2022   Procedure: REVISON OF ARTERIOVENOUS FISTULA;  Surgeon: Cherre Robins, MD;  Location: [place];  Service: Vascular;  Laterality: Left;   RIGHT/LEFT HEART CATH AND CORONARY ANGIOGRAPHY N/A 01/03/2021   Procedure: RIGHT/LEFT HEART CATH AND CORONARY ANGIOGRAPHY;  Surgeon: Larey Dresser, MD;  Location: [place] [facility];  Service: Cardiovascular;  Laterality: N/A;    Social History:  reports that he quit smoking about 10 years ago. His smoking use included cigarettes. He has a 52.50 pack-year smoking history. He has never been exposed to tobacco smoke. He has never used smokeless tobacco. He reports that he does not drink alcohol and does not use drugs. Family History:  Family History  Problem Relation Age of Onset   CAD Father    Colon cancer Father    Diabetes Brother    Sleep apnea Son      HOME MEDICATIONS: Allergies as of 11/30/2022   No Active Allergies      Medication List        Accurate as of November 30, 2022  2:01 PM. If you have any questions, ask your nurse or doctor.          ARANESP (ALBUMIN FREE) IJ Darbepoetin Alfa (Aranesp)   atorvastatin 80 MG tablet Commonly known as: LIPITOR Take 1 tablet (80 mg total) by mouth daily.   calcium acetate 667 MG capsule Commonly known  as: PHOSLO Take 667 mg by mouth 3 (three) times daily with meals.   carvedilol 12.5 MG tablet Commonly known as: COREG Take 1 tablet (12.5 mg total) by mouth 2 (two) times daily with a meal.   diclofenac Sodium 1 % Gel Commonly known as: Voltaren Apply 4 grams topically 4 (four) times daily.   DULoxetine 30 MG capsule Commonly known as: Cymbalta Take 1 capsule (30 mg total) by mouth daily.   Eliquis 5 MG Tabs tablet Generic drug: apixaban Take 1 tablet (5 mg total) by mouth 2 (two) times daily.   ezetimibe 10 MG tablet Commonly known as: ZETIA Take 1 tablet by mouth daily What changed:  how much to take how to take this when to take this   FreeStyle Libre 2 Sensor Misc 1 Device by Does not apply route every 14 (fourteen) days.   Guardian Sensor 3 Misc 1 Device by Does not apply route as directed.   Guardian Link 3 Transmitter Misc 1 Device by Does not apply route as directed.   heparin 1000 unit/mL Soln injection Heparin Sodium (Porcine) 1,000 Units/mL Catheter Lock Venous   hydrALAZINE 100 MG tablet Commonly known as: APRESOLINE Take 1 tablet (100 mg total) by mouth 3 (three) times daily. DO NOT TAKE AM AND NOON DOSES ON HD DAYS.   HYDROcodone-acetaminophen 10-325 MG tablet Commonly known as: NORCO Take 1 tablet by mouth every 4 to 6 hours as needed for pain.   isosorbide mononitrate 60 MG 24 hr tablet Commonly known as: IMDUR Take 1&1/2 tablets (90 mg total) by mouth daily. Hold on days of dialysis on Mon, Wed, Fri.  lidocaine 5 % Commonly known as: Lidoderm Apply 1 patch to skin as directed every day (May wear up to 12 hours.)   LOKELMA PO Take 1 packet by mouth 4 (four) times a week. Hold on dialysis days   Lyumjev 100 UNIT/ML Soln Generic drug: Insulin Lispro-aabc Max Daily 30 units per insulin pump   megestrol 625 MG/5ML suspension Commonly known as: Megace ES Take 5 mLs (625 mg total) by mouth daily.   MIRCERA IJ Inject as directed.    OneTouch Verio test strip Generic drug: glucose blood 1 each by Other route in the morning, at noon, in the evening, and at bedtime. Use as instructed   pantoprazole 40 MG tablet Commonly known as: PROTONIX Take 1 tablet (40 mg total) by mouth 2 (two) times daily.   Stiolto Respimat 2.5-2.5 MCG/ACT Aers Generic drug: Tiotropium Bromide-Olodaterol Inhale 2 puffs by mouth into the lungs daily.   TechLite Pen Needles 32G X 6 MM Misc Generic drug: Insulin Pen Needle Use as directed 3 times daily         ALLERGIES: No Active Allergies   REVIEW OF SYSTEMS: A comprehensive ROS was conducted with the patient and is negative except as per HPI     OBJECTIVE:   VITAL SIGNS: BP (!) 142/84 (BP Location: Right Arm, Patient Position: Sitting, Cuff Size: Normal)   Ht 5\' 3"  (1.6 m)   Wt 127 lb (57.6 kg)   SpO2 98%   BMI 22.50 kg/m    PHYSICAL EXAM:  General: Pt appears well and is in NAD  Lungs: Clear with good BS bilat   Heart: RRR   Extremities:  Lower extremities - No pretibial edema.  Neuro: MS is good with appropriate affect, pt is alert and Ox3    DM foot exam: 11/10/2022 per podiatry      DATA REVIEWED:  Lab Results  Component Value Date   HGBA1C 7.8 (A) 11/30/2022   HGBA1C 7.6 (A) 08/04/2022   HGBA1C 9.0 (A) 06/04/2022    Latest Reference Range & Units 11/05/22 15:29  Sodium 135 - 145 mEq/L 135  Potassium 3.5 - 5.1 mEq/L 4.7  Chloride 96 - 112 mEq/L 93 (L)  CO2 19 - 32 mEq/L 33 (H)  Glucose 70 - 99 mg/dL 290 (H)  BUN 6 - 23 mg/dL 21  Creatinine 0.40 - 1.50 mg/dL 5.74 (HH)  Calcium 8.4 - 10.5 mg/dL 8.3 (L)  Alkaline Phosphatase 39 - 117 U/L 107  Albumin 3.5 - 5.2 g/dL 3.7  AST 0 - 37 U/L 17  ALT 0 - 53 U/L 8  Total Protein 6.0 - 8.3 g/dL 6.8  Total Bilirubin 0.2 - 1.2 mg/dL 0.4  GFR >60.00 mL/min 9.71 (LL)    ASSESSMENT / PLAN / RECOMMENDATIONS:   1) Type 2 Diabetes Mellitus, with improving glucose control , With Neuropathic, ESRD on HD and  macrovascular  complications - Most recent A1c of 7.8 %. Goal A1c < 7.0 %.     - A1c skewed due to ESRD  -Barriers to diabetes self-care is language -I have attempted to have him see our CDE for tandem pump but there has been communication issues, and I am going to attempt to prescribe the Medtronic Guardian sensor and transmitter to see how he will do with it, if this is not going to work well I will pursue tandem pump again   - MEDICATIONS:     Pump   Medtronic 630 G Settings   Insulin type   Lyumjev  Basal rate       0000 0.400  u/h    0600 0.400 u/h    1800 0.550u/h       I:C ratio       0000 1:12                  Sensitivity       0000  55      Goal       0000  110         EDUCATION / INSTRUCTIONS: BG monitoring instructions: Patient is instructed to check his blood sugars 3 times a day, before meals . Call [facility] if: BG persistently < 70  I reviewed the Rule of 15 for the treatment of hypoglycemia in detail with the patient. Literature supplied.   2) Diabetic complications:  Eye: Does not have known diabetic retinopathy.  Neuro/ Feet: Does  have known diabetic peripheral neuropathy. Renal: Patient does  have known baseline CKD. He is  on an ACEI/ARB at present.  Follow-up in 6 months  Signed electronically by: Mack Guise, MD  [facility]  [facility] [address] [place], [place] [address] Phone: [phone] FAX: [phone]   CC: Shelda Pal, [address] [place] [address] Phone: [phone]  Fax: [phone]    Return to Endocrinology clinic as below: Future Appointments  Date Time Provider [place]  12/07/2022  1:45 PM Jaye Beagle, PT [facility]  12/09/2022  2:00 PM Artist Pais, PTA [facility]  12/16/2022  1:45 PM Riddles, Corky Downs, PT [facility]  12/21/2022  1:45 PM Riddles, Corky Downs, PT [facility]   12/23/2022  1:15 PM Artist Pais, PTA [facility]  12/28/2022  1:45 PM Riddles, Corky Downs, PT [facility]  12/30/2022  1:15 PM Artist Pais, PTA [facility]  01/04/2023  1:45 PM Riddles, Corky Downs, PT [facility]  02/09/2023  3:15 PM Gardiner Barefoot, DPM [facility]  02/23/2023  2:30 PM Cameron Sprang, MD [facility] None  05/11/2023 10:30 AM Shelda Pal, DO [facility]

## 2022-12-01 ENCOUNTER — Encounter: Payer: Self-pay | Admitting: Internal Medicine

## 2022-12-02 ENCOUNTER — Other Ambulatory Visit (HOSPITAL_BASED_OUTPATIENT_CLINIC_OR_DEPARTMENT_OTHER): Payer: Self-pay

## 2022-12-02 MED ORDER — HYDROCODONE-ACETAMINOPHEN 10-325 MG PO TABS
ORAL_TABLET | ORAL | 0 refills | Status: DC
  Filled 2022-12-02: qty 60, 10d supply, fill #0

## 2022-12-06 NOTE — Therapy (Signed)
OUTPATIENT PHYSICAL THERAPY THORACOLUMBAR EVALUATION   Patient Name: Kenneth [name] MRN: [id number] DOB:1956/06/07, 67 y.o., male Today's Date: 12/07/2022  END OF SESSION:  PT End of Session - 12/07/22 1355     Visit Number 1    Date for PT Re-Evaluation 02/01/23    Authorization Type U[employer] MCR    Progress Note Due on Visit 10    PT Start Time 1400    PT Stop Time 1450    PT Time Calculation (min) 50 min    Activity Tolerance Patient tolerated treatment well    Behavior During Therapy [facility] for tasks assessed/performed             Past Medical History:  Diagnosis Date   Arthritis    hands   CAD (coronary artery disease) 10/30/2016   NSTEMI 9/17 with CABG x 3 (LIMA-LAD, SVG-OM, SVG-RCA).   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   type 1 DM   Dyspnea    with exertion   ESRD on hemodialysis ([place])    "MWF; [place]" (03/30/2018)   Gastroesophageal reflux disease 08/18/2016   Hyperlipidemia    Hypertension    Ischemic rest pain of lower extremity 02/06/2015   Keratoma 02/27/2015   Metatarsal deformity 02/27/2015   PAF (paroxysmal atrial fibrillation) (HCC)    Pronation deformity of both feet 02/27/2015   Past Surgical History:  Procedure Laterality Date   A/V FISTULAGRAM Left 03/06/2021   Procedure: A/V FISTULAGRAM;  Surgeon: Marty Heck, MD;  Location: [place] [facility];  Service: Cardiovascular;  Laterality: Left;   A/V FISTULAGRAM Left 06/02/2022   Procedure: A/V Fistulagram;  Surgeon: Serafina Mitchell, MD;  Location: [place] [facility];  Service: Cardiovascular;  Laterality: Left;   AV FISTULA PLACEMENT Left 06/22/2016   Procedure: ARTERIOVENOUS (AV) FISTULA CREATION LEFT UPPER ARM;  Surgeon: Rosetta Posner, MD;  Location: [place];  Service: Vascular;  Laterality: Left;   BIOPSY  12/19/2020   Procedure: BIOPSY;  Surgeon:  Ladene Artist, MD;  Location: [place];  Service: Endoscopy;;   CARDIAC CATHETERIZATION N/A 05/31/2016   Procedure: Left Heart Cath and Coronary Angiography;  Surgeon: Jettie Booze, MD;  Location: [place] [facility];  Service: Cardiovascular;  Laterality: N/A;   CARDIAC CATHETERIZATION N/A 05/31/2016   Procedure: Right Heart Cath;  Surgeon: Jettie Booze, MD;  Location: [place] [facility];  Service: Cardiovascular;  Laterality: N/A;   CARDIAC CATHETERIZATION N/A 05/31/2016   Procedure: IABP Insertion;  Surgeon: Jettie Booze, MD;  Location: [place] [facility];  Service: Cardiovascular;  Laterality: N/A;   COLONOSCOPY WITH PROPOFOL N/A 12/20/2020   Procedure: COLONOSCOPY WITH PROPOFOL;  Surgeon: Mauri Pole, MD;  Location: [place] [facility];  Service: Endoscopy;  Laterality: N/A;   CORONARY ARTERY BYPASS GRAFT N/A 06/05/2016   Procedure: CORONARY ARTERY BYPASS GRAFTING (CABG) x3 LIMA to LAD -SVG to OM -SVG to RCA;  Surgeon: Ivin Poot, MD;  Location: [place];  Service: Open Heart Surgery;  Laterality: N/A;   CORONARY STENT INTERVENTION N/A 07/07/2017   Procedure: CORONARY STENT INTERVENTION;  Surgeon: Wellington Hampshire, MD;  Location: [place] [facility];  Service: Cardiovascular;  Laterality: N/A;   CORONARY STENT INTERVENTION N/A 03/30/2018   Procedure: CORONARY STENT INTERVENTION;  Surgeon: Martinique, Peter M, MD;  Location: [place] [facility];  Service: Cardiovascular;  Laterality: N/A;   ESOPHAGOGASTRODUODENOSCOPY (EGD) WITH PROPOFOL N/A 12/19/2020   Procedure: ESOPHAGOGASTRODUODENOSCOPY (EGD) WITH PROPOFOL;  Surgeon: Ladene Artist, MD;  Location: [facility];  Service: Endoscopy;  Laterality: N/A;   EXCHANGE OF A DIALYSIS CATHETER Right 04/16/2022   Procedure: EXCHANGE OF A DIALYSIS CATHETER;  Surgeon: Cherre Robins, MD;  Location: [place];  Service: Vascular;  Laterality: Right;   GIVENS CAPSULE STUDY N/A 12/20/2020   Procedure: GIVENS CAPSULE STUDY;  Surgeon: Mauri Pole, MD;  Location: [place] [facility]OS[facility];  Service: Endoscopy;  Laterality: N/A;   INSERTION OF DIALYSIS CATHETER N/A 06/05/2016   Procedure: INSERTION OF DIALYSIS/trialysis CATHETER;  Surgeon: Ivin Poot, MD;  Location: [place];  Service: Vascular;  Laterality: N/A;   INSERTION OF DIALYSIS CATHETER Right 06/13/2016   Procedure: INSERTION OF DIALYSIS CATHETER RIGHT INTERNAL JUGULAR;  Surgeon: Conrad Rossmoor, MD;  Location: [place];  Service: Vascular;  Laterality: Right;   INSERTION OF DIALYSIS CATHETER Right 09/07/2019   Procedure: Insertion Of Dialysis Catheter;  Surgeon: Rosetta Posner, MD;  Location: [facility];  Service: Vascular;  Laterality: Right;   INSERTION OF DIALYSIS CATHETER N/A 04/09/2022   Procedure: INSERTION OF TUNNELED DIALYSIS CATHETER;  Surgeon: Cherre Robins, MD;  Location: [place];  Service: Vascular;  Laterality: N/A;   INTRAOPERATIVE TRANSESOPHAGEAL ECHOCARDIOGRAM N/A 06/05/2016   Procedure: INTRAOPERATIVE TRANSESOPHAGEAL ECHOCARDIOGRAM;  Surgeon: Ivin Poot, MD;  Location: [place];  Service: Open Heart Surgery;  Laterality: N/A;   LEFT HEART CATH AND CORS/GRAFTS ANGIOGRAPHY N/A 11/03/2016   Procedure: Left Heart Cath and Cors/Grafts Angiography;  Surgeon: Troy Sine, MD;  Location: [place] [facility];  Service: Cardiovascular;  Laterality: N/A;   LEFT HEART CATH AND CORS/GRAFTS ANGIOGRAPHY N/A 07/07/2017   Procedure: LEFT HEART CATH AND CORS/GRAFTS ANGIOGRAPHY;  Surgeon: Larey Dresser, MD;  Location: [place] [facility];  Service: Cardiovascular;  Laterality: N/A;   LEFT HEART CATH AND CORS/GRAFTS ANGIOGRAPHY N/A 03/30/2018   Procedure: LEFT HEART CATH AND CORS/GRAFTS ANGIOGRAPHY;  Surgeon: Martinique, Peter M, MD;  Location: [place] [facility];  Service: Cardiovascular;  Laterality: N/A;   LEFT HEART CATH AND CORS/GRAFTS ANGIOGRAPHY N/A 04/17/2019   Procedure: LEFT HEART CATH AND CORS/GRAFTS ANGIOGRAPHY;  Surgeon: Larey Dresser, MD;  Location: [place] [facility];  Service:  Cardiovascular;  Laterality: N/A;   LUMBAR LAMINECTOMY/DECOMPRESSION MICRODISCECTOMY Bilateral 09/01/2022   Procedure: Laminectomy and Foraminotomy - bilateral - Lumbar three-Lumbar four;  Surgeon: Earnie Larsson, MD;  Location: [place];  Service: Neurosurgery;  Laterality: Bilateral;   PERIPHERAL VASCULAR BALLOON ANGIOPLASTY Left 06/02/2022   Procedure: PERIPHERAL VASCULAR BALLOON ANGIOPLASTY;  Surgeon: Serafina Mitchell, MD;  Location: [place] [facility];  Service: Cardiovascular;  Laterality: Left;   PERIPHERAL VASCULAR INTERVENTION Left 03/06/2021   Procedure: PERIPHERAL VASCULAR INTERVENTION;  Surgeon: Marty Heck, MD;  Location: [place] [facility];  Service: Cardiovascular;  Laterality: Left;   POLYPECTOMY  12/20/2020   Procedure: POLYPECTOMY;  Surgeon: Mauri Pole, MD;  Location: [place] ENDOSCOPY;  Service: Endoscopy;;   REVISION OF ARTERIOVENOUS GORETEX GRAFT Left 123XX123   Procedure: PLICATION OF ANEURYSM OF ARTERIOVENOUS FISTULA  LEFT ARM;  Surgeon: Rosetta Posner, MD;  Location[facility] OR;  Service: Vascular;  Laterality: Left;   REVISON OF ARTERIOVENOUS FISTULA Left 09/07/2019   Procedure: REVISON OF LEFT UPPER ARM ARTERIOVENOUS FISTULA;  Surgeon: Rosetta Posner, MD;  Location: [place];  Service:  Vascular;  Laterality: Left;   REVISON OF ARTERIOVENOUS FISTULA Left 04/09/2022   Procedure: LEFT ARM FISTULA REVISION;  Surgeon: Cherre Robins, MD;  Location: [place];  Service: Vascular;  Laterality: Left;  PERIPHERAL NERVE BLOCK   REVISON OF ARTERIOVENOUS FISTULA Left 04/16/2022   Procedure: REVISON OF ARTERIOVENOUS FISTULA;  Surgeon: Cherre Robins, MD;  Location: [place];  Service: Vascular;  Laterality: Left;   RIGHT/LEFT HEART CATH AND CORONARY ANGIOGRAPHY N/A 01/03/2021   Procedure: RIGHT/LEFT HEART CATH AND CORONARY ANGIOGRAPHY;  Surgeon: Larey Dresser, MD;  Location: [place] [facility];  Service: Cardiovascular;  Laterality: N/A;   Patient Active Problem List   Diagnosis Date Noted    Porokeratosis 11/10/2022   Depression, recurrent ([place]) 10/06/2022   Lumbar stenosis with neurogenic claudication 09/01/2022   Chronic arthritis 08/11/2022   Tinea pedis 06/09/2022   Orthostatic hypotension 04/15/2022   Elevated troponin 123XX123   Complication associated with dialysis catheter 123XX123   Complication of AV dialysis fistula 04/15/2022   ESRD (end stage renal disease) ([place]) 04/09/2022   Pulmonary emphysema ([place]) 02/21/2022   Acute on chronic anemia 02/21/2022   Type 2 diabetes mellitus with hyperglycemia, with long-term current use of insulin ([place]) 10/28/2021   Type 2 diabetes mellitus with diabetic polyneuropathy, with long-term current use of insulin ([place]) 10/28/2021   Hypertension associated with diabetes ([place]) 09/16/2021   Chronic bilateral low back pain with bilateral sciatica 09/16/2021   Partial seizure disorder ([place])    Acute respiratory failure with hypoxia ([place]) 07/28/2021   Prolonged QT interval 01/01/2021   Hemoptysis 01/01/2021   Occult blood in stools    Symptomatic bradycardia 12/15/2020   Postural dizziness with presyncope 12/15/2020   Blood in stool 0000000   Acute metabolic encephalopathy [id number]   Altered mental status 08/27/2019   Unresponsive episode 08/27/2019   AF (paroxysmal atrial fibrillation) ([place]) 08/27/2019   Status post coronary artery stent placement    NSTEMI (non-ST elevated myocardial infarction) ([place]) 03/30/2018   Hypertensive heart disease 09/12/2017   Hyperlipidemia associated with type 2 diabetes mellitus ([place]) 09/12/2017   Hyperkalemia 07/20/2017   Chronic diastolic CHF (congestive heart failure) ([place]) 04/14/2017   Hypertensive emergency 04/14/2017   Anemia, chronic renal failure 04/11/2017   Fluid overload 04/11/2017   Diabetic foot ulcer ([place]) 04/11/2017   Cellulitis in diabetic foot ([place]) 01/18/2017   Chest pain 12/16/2016   Hypertensive urgency 12/16/2016   Pressure injury of skin 11/03/2016   Type 2  diabetes mellitus with chronic kidney disease on chronic dialysis, with long-term current use of insulin ([place]) 10/31/2016   ESRD (end stage renal disease) on dialysis ([place]) 10/30/2016   Atherosclerotic heart disease of native coronary artery with other forms of angina pectoris with elevated troponin     Gastroesophageal reflux disease 08/18/2016   HCAP (healthcare-associated pneumonia)    Non-ST elevation (NSTEMI) myocardial infarction [facility])    Pronation deformity of both feet 02/27/2015   Metatarsal deformity 02/27/2015   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 I have electricity shock so I cannot sleep. Goes to bil feet. Had back surgery in 2022. Has used walker since surgery.  Interpreter: [id number]  PERTINENT HISTORY:  T2DM, CKD on dialysis, CHF, L3/4 discectomy  PAIN:  Are you having pain? Yes: NPRS scale: 8 in back 12/10 in bil feet/10 Pain location: low back and bil LE to feet Pain description: pain in back, electricity in legs Aggravating factors: everything Relieving factors: nothing  PRECAUTIONS: None  WEIGHT BEARING RESTRICTIONS: No  FALLS:  Has patient fallen in last 6 months? No  LIVING ENVIRONMENT: Lives with: lives with their family Lives in: House/apartment Stairs: Yes: Internal: 13 steps; on  left going up and External: 5 steps; on left going up Has following equipment at home: Single point cane, Walker - 2 wheeled, and Environmental consultant - 4 wheeled  OCCUPATION: retired  PLOF: Independent with household mobility with device  PATIENT GOALS: get rid of pain  NEXT MD VISIT: 2 weeks now  OBJECTIVE:   DIAGNOSTIC FINDINGS:  MRI 10/26/22 IMPRESSION: 1. Interval posterior decompression and discectomy at L3-4. There is progressive loss of disc height with probable intradiscal vacuum phenomenon and a broad-based central and right-sided disc protrusion which appears slightly increased from the preoperative study. There is progressive mass effect on the thecal sac which is incompletely decompressed posteriorly. Right foraminal narrowing appears slightly worse with probable right L3 nerve root encroachment. 2. No other significant changes are identified. Multilevel spondylosis superimposed on congenitally short pedicles with resulting chronic multifactorial spinal stenosis from L1-2 through L4-5. 3. Reactive endplate edema at [id number] without evidence of discitis/osteomyelitis.  PATIENT SURVEYS:  Modified Oswestry  40 / 50 = 80.0 %   SCREENING FOR RED FLAGS: Bowel or bladder incontinence: No Spinal tumors: No Cauda equina syndrome: No Compression fracture: No Abdominal aneurysm: No  COGNITION: Overall cognitive status: Within functional limits for tasks assessed     SENSATION: WFL  MUSCLE LENGTH: HS: tight bil L> R Piriformis: bil L> R Hip Flexors: NT, but likely tight due to posture in standing Heelcords: WFL   POSTURE: rounded shoulders, forward head, decreased lumbar lordosis, increased thoracic kyphosis, and posterior pelvic tilt  PALPATION: Palpation: TTP at Bil gluteals L >R, bil lumbar.  Spinal mobility NT due to patient's pain level.  LUMBAR ROM:   AROM eval  Flexion Hands to knees  Extension Unable to get to neutral - bends knees to extend backward  Right  lateral flexion 80% limited  Left lateral flexion 90% limited  Right rotation 75% limited  Left rotation 80% limited   (Blank rows = not tested)  LOWER EXTREMITY ROM:   WFL for MMT, hip extension not tested but decreased based on patient's inability to stand in neutral.  LOWER EXTREMITY MMT:    MMT Right eval Left eval  Hip flexion 4 4-  Hip extension 4-* 4-*  Hip abduction 5 4+  Hip adduction 4 4-  Hip internal rotation    Hip external rotation    Knee flexion 5 4+  Knee extension 4+ 4  Ankle dorsiflexion 5 5   (Blank rows = not tested)   FUNCTIONAL TESTS:  30 seconds chair stand test: TBD  GAIT: Distance walked: 40 Assistive device utilized: Walker - 4 wheeled Level of assistance: Complete Independence Comments: flexed trunk with walker too far ahead of him  TODAY'S TREATMENT:  DATE:   12/07/22 See pt ed  PATIENT EDUCATION:  Education details: PT eval findings, anticipated POC, need for further assessment of 30 sec chair stand test, initial HEP, and role of DN  Person educated: Patient Education method: Explanation, Demonstration, Tactile cues, Verbal cues, and Handouts Education comprehension: verbalized understanding, returned demonstration, verbal cues required, and needs further education  HOME EXERCISE PROGRAM: Access Code: [email or web address] URL: [email or web address] Date: 12/07/2022 Prepared by: Almyra Free  Exercises - Seated Hamstring Stretch with Chair  - 2 x daily - 7 x weekly - 1 sets - 3 reps - 20-30 sec hold - Seated Figure 4 Piriformis Stretch  - 2 x daily - 7 x weekly - 1 sets - 3 reps - 30-60 sec hold - Seated Trunk Rotation  - 1 x daily - 7 x weekly - 1 sets - 10 reps - 10 sec hold  ASSESSMENT:  CLINICAL IMPRESSION: Kenneth [name]  is a 67 y.o. male who was seen today for physical therapy evaluation and treatment for low  back pain. He had a lumbar discectomy in 2022 which is still healing due to co-morbidities. He has dialysis 3 days/wk and has a fistula in his left UE. His low back surgical wound is being monitored by Dr. Annette Stable. Kenneth [name] amb with a rollator at a quick pace and subsequent poor form with walker too advanced ahead of him. He has significant flexibility deficits resulting in postural abnormalities including standing in lumbar, hip and knee flexion. He also sits with a post pelvic tilt and kyphotic spine. He has pain in all positions, but standing is the worst. He is unable to lie on his back due to pain. He has weakness in bil LE L> R. These deficits affect all ADLs and he cannot sleep. [place] will benefit from skilled PT to address these deficits.    OBJECTIVE IMPAIRMENTS: decreased activity tolerance, difficulty walking, decreased ROM, decreased strength, increased muscle spasms, impaired flexibility, postural dysfunction, and pain.   ACTIVITY LIMITATIONS: carrying, lifting, bending, sitting, standing, squatting, sleeping, stairs, transfers, bed mobility, hygiene/grooming, and locomotion level  PARTICIPATION LIMITATIONS:  all ADLs limited  PERSONAL FACTORS: Time since onset of injury/illness/exacerbation and 3+ comorbidities: T2DM, CKD on dialysis, CHF, L3/4 discectomy  are also affecting patient's functional outcome.   REHAB POTENTIAL: Good  CLINICAL DECISION MAKING: Evolving/moderate complexity  EVALUATION COMPLEXITY: Moderate   GOALS: Goals reviewed with patient? Yes  SHORT TERM GOALS: Target date: 01/04/2023   Patient will be independent with initial HEP.  Baseline:  Goal status: INITIAL  2.  Patient will report decreased pain by 25%.  Baseline:   LONG TERM GOALS: Target date: 02/01/2023    Patient will be independent with advanced/ongoing HEP to improve outcomes and carryover.  Baseline:  Goal status: INITIAL  2.  Patient will report 50-75% improvement in low back and leg pain to  improve QOL.  Baseline:  Goal status: INITIAL  3.  Patient will demonstrate functional lumbar ROM to perform ADLs.   Baseline:  Goal status: INITIAL  4.  Patient will demonstrate improved functional strength by increased 30 second chair stand test by 5. Baseline: TBD Goal status: INITIAL  5.  Patient will report 34 on modified oswestry to demonstrate improved functional ability.  Baseline:  40 / 50 = 80.0 % Goal status: INITIAL   6.  Patient will report ability to sleep a minimum of 5 hours without waking from pain. Baseline:  Goal status: INITIAL     PLAN:  PT FREQUENCY: 2x/week  PT DURATION: 8 weeks  PLANNED INTERVENTIONS: Therapeutic exercises, Therapeutic activity, Neuromuscular re-education, Balance training, Gait training, Patient/Family education, Self Care, Joint mobilization, Aquatic Therapy, Dry Needling, Electrical stimulation, Spinal mobilization, Cryotherapy, Moist heat, scar mobilization, Taping, Ultrasound, Ionotophoresis 4mg /ml Dexamethasone, and Manual therapy.  PLAN FOR NEXT SESSION: Complete 30 sec chair stand test, Review HEP and progress for lumbar mobility (can't lie on back), LE flexibility and strength. MT/DN? And modalities for pain.     Alizah Sills, PT 12/07/2022, 3:38 PM

## 2022-12-07 ENCOUNTER — Other Ambulatory Visit: Payer: Self-pay

## 2022-12-07 ENCOUNTER — Encounter: Payer: Self-pay | Admitting: Physical Therapy

## 2022-12-07 ENCOUNTER — Ambulatory Visit: Payer: 59 | Attending: Neurosurgery | Admitting: Physical Therapy

## 2022-12-07 DIAGNOSIS — R252 Cramp and spasm: Secondary | ICD-10-CM | POA: Insufficient documentation

## 2022-12-07 DIAGNOSIS — M5416 Radiculopathy, lumbar region: Secondary | ICD-10-CM | POA: Diagnosis present

## 2022-12-07 DIAGNOSIS — M6281 Muscle weakness (generalized): Secondary | ICD-10-CM | POA: Diagnosis present

## 2022-12-08 ENCOUNTER — Encounter (HOSPITAL_COMMUNITY): Payer: Self-pay | Admitting: Gastroenterology

## 2022-12-09 ENCOUNTER — Ambulatory Visit: Payer: 59

## 2022-12-09 DIAGNOSIS — M5416 Radiculopathy, lumbar region: Secondary | ICD-10-CM

## 2022-12-09 DIAGNOSIS — M6281 Muscle weakness (generalized): Secondary | ICD-10-CM

## 2022-12-09 DIAGNOSIS — R252 Cramp and spasm: Secondary | ICD-10-CM

## 2022-12-09 NOTE — Therapy (Signed)
OUTPATIENT PHYSICAL THERAPY TREATMENT   Patient Name: Kenneth [name] MRN: [id number] DOB:[birth year], 67 y.o., male Today's Date: 12/09/2022  END OF SESSION:  PT End of Session - 12/09/22 1354     Visit Number 2    Date for PT Re-Evaluation 02/01/23    Authorization Type UHC MCR    Progress Note Due on Visit 10    PT Start Time 1350    PT Stop Time 1435    PT Time Calculation (min) 45 min    Activity Tolerance Patient tolerated treatment well    Behavior During Therapy [facility] for tasks assessed/performed              Past Medical History:  Diagnosis Date   Arthritis    hands   CAD (coronary artery disease) 10/30/2016   NSTEMI 9/17 with CABG x 3 (LIMA-LAD, SVG-OM, SVG-RCA).   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   type 1 DM   Dyspnea    with exertion   ESRD on hemodialysis ([place])    "MWF; [place]" (03/30/2018)   Gastroesophageal reflux disease 08/18/2016   Hyperlipidemia    Hypertension    Ischemic rest pain of lower extremity 02/06/2015   Keratoma 02/27/2015   Metatarsal deformity 02/27/2015   PAF (paroxysmal atrial fibrillation) (HCC)    Pronation deformity of both feet 02/27/2015   Past Surgical History:  Procedure Laterality Date   A/V FISTULAGRAM Left 03/06/2021   Procedure: A/V FISTULAGRAM;  Surgeon: Marty Heck, MD;  Location: [place] [facility];  Service: Cardiovascular;  Laterality: Left;   A/V FISTULAGRAM Left 06/02/2022   Procedure: A/V Fistulagram;  Surgeon: Serafina Mitchell, MD;  Location: [place] [facility];  Service: Cardiovascular;  Laterality: Left;   AV FISTULA PLACEMENT Left 06/22/2016   Procedure: ARTERIOVENOUS (AV) FISTULA CREATION LEFT UPPER ARM;  Surgeon: Rosetta Posner, MD;  Location: [place];  Service: Vascular;  Laterality: Left;   BIOPSY  12/19/2020   Procedure: BIOPSY;  Surgeon: Ladene Artist, MD;  Location: [place];  Service: Endoscopy;;   CARDIAC CATHETERIZATION N/A 05/31/2016   Procedure: Left Heart Cath and Coronary Angiography;  Surgeon: Jettie Booze, MD;  Location: [place] [facility];  Service: Cardiovascular;  Laterality: N/A;   CARDIAC CATHETERIZATION N/A 05/31/2016   Procedure: Right Heart Cath;  Surgeon: Jettie Booze, MD;  Location: [place] [facility];  Service: Cardiovascular;  Laterality: N/A;   CARDIAC CATHETERIZATION N/A 05/31/2016   Procedure: IABP Insertion;  Surgeon: Jettie Booze, MD;  Location: [place] [facility];  Service: Cardiovascular;  Laterality: N/A;   COLONOSCOPY WITH PROPOFOL N/A 12/20/2020   Procedure: COLONOSCOPY WITH PROPOFOL;  Surgeon: Mauri Pole, MD;  Location: [place] [facility];  Service: Endoscopy;  Laterality: N/A;   CORONARY ARTERY BYPASS GRAFT N/A 06/05/2016   Procedure: CORONARY ARTERY BYPASS GRAFTING (CABG) x3 LIMA to LAD -SVG to OM -SVG to RCA;  Surgeon: Ivin Poot, MD;  Location: [place];  Service: Open Heart Surgery;  Laterality: N/A;   CORONARY STENT INTERVENTION N/A 07/07/2017   Procedure: CORONARY STENT INTERVENTION;  Surgeon: Wellington Hampshire, MD;  Location: [place] [facility];  Service: Cardiovascular;  Laterality: N/A;   CORONARY STENT INTERVENTION N/A 03/30/2018   Procedure: CORONARY STENT INTERVENTION;  Surgeon: Martinique, Peter M, MD;  Location: [place] [facility];  Service: Cardiovascular;  Laterality: N/A;   ESOPHAGOGASTRODUODENOSCOPY (EGD) WITH PROPOFOL N/A 12/19/2020   Procedure: ESOPHAGOGASTRODUODENOSCOPY (EGD) WITH PROPOFOL;  Surgeon: Ladene Artist, MD;  Location: [facility]COPY;  Service: Endoscopy;  Laterality: N/A;   EXCHANGE OF A DIALYSIS CATHETER Right 04/16/2022   Procedure: EXCHANGE OF A DIALYSIS CATHETER;  Surgeon: Cherre Robins, MD;  Location: [place];  Service: Vascular;  Laterality: Right;   GIVENS CAPSULE STUDY N/A 12/20/2020   Procedure: GIVENS CAPSULE STUDY;  Surgeon: Mauri Pole, MD;   Location: [place] [facility];  Service: Endoscopy;  Laterality: N/A;   INSERTION OF DIALYSIS CATHETER N/A 06/05/2016   Procedure: INSERTION OF DIALYSIS/trialysis CATHETER;  Surgeon: Ivin Poot, MD;  Location: [place];  Service: Vascular;  Laterality: N/A;   INSERTION OF DIALYSIS CATHETER Right 06/13/2016   Procedure: INSERTION OF DIALYSIS CATHETER RIGHT INTERNAL JUGULAR;  Surgeon: Conrad Newcastle, MD;  Location: [place];  Service: Vascular;  Laterality: Right;   INSERTION OF DIALYSIS CATHETER Right 09/07/2019   Procedure: Insertion Of Dialysis Catheter;  Surgeon: Rosetta Posner, MD;  Location: [facility] OR;  Service: Vascular;  Laterality: Right;   INSERTION OF DIALYSIS CATHETER N/A 04/09/2022   Procedure: INSERTION OF TUNNELED DIALYSIS CATHETER;  Surgeon: Cherre Robins, MD;  Location: [place];  Service: Vascular;  Laterality: N/A;   INTRAOPERATIVE TRANSESOPHAGEAL ECHOCARDIOGRAM N/A 06/05/2016   Procedure: INTRAOPERATIVE TRANSESOPHAGEAL ECHOCARDIOGRAM;  Surgeon: Ivin Poot, MD;  Location: [place];  Service: Open Heart Surgery;  Laterality: N/A;   LEFT HEART CATH AND CORS/GRAFTS ANGIOGRAPHY N/A 11/03/2016   Procedure: Left Heart Cath and Cors/Grafts Angiography;  Surgeon: Troy Sine, MD;  Location: [place] [facility];  Service: Cardiovascular;  Laterality: N/A;   LEFT HEART CATH AND CORS/GRAFTS ANGIOGRAPHY N/A 07/07/2017   Procedure: LEFT HEART CATH AND CORS/GRAFTS ANGIOGRAPHY;  Surgeon: Larey Dresser, MD;  Location: [place] [facility];  Service: Cardiovascular;  Laterality: N/A;   LEFT HEART CATH AND CORS/GRAFTS ANGIOGRAPHY N/A 03/30/2018   Procedure: LEFT HEART CATH AND CORS/GRAFTS ANGIOGRAPHY;  Surgeon: Martinique, Peter M, MD;  Location: [place] [facility];  Service: Cardiovascular;  Laterality: N/A;   LEFT HEART CATH AND CORS/GRAFTS ANGIOGRAPHY N/A 04/17/2019   Procedure: LEFT HEART CATH AND CORS/GRAFTS ANGIOGRAPHY;  Surgeon: Larey Dresser, MD;  Location: [place] [facility];  Service: Cardiovascular;   Laterality: N/A;   LUMBAR LAMINECTOMY/DECOMPRESSION MICRODISCECTOMY Bilateral 09/01/2022   Procedure: Laminectomy and Foraminotomy - bilateral - Lumbar three-Lumbar four;  Surgeon: Earnie Larsson, MD;  Location: [place];  Service: Neurosurgery;  Laterality: Bilateral;   PERIPHERAL VASCULAR BALLOON ANGIOPLASTY Left 06/02/2022   Procedure: PERIPHERAL VASCULAR BALLOON ANGIOPLASTY;  Surgeon: Serafina Mitchell, MD;  Location: [place] [facility];  Service: Cardiovascular;  Laterality: Left;   PERIPHERAL VASCULAR INTERVENTION Left 03/06/2021   Procedure: PERIPHERAL VASCULAR INTERVENTION;  Surgeon: Marty Heck, MD;  Location: [place] [facility];  Service: Cardiovascular;  Laterality: Left;   POLYPECTOMY  12/20/2020   Procedure: POLYPECTOMY;  Surgeon: Mauri Pole, MD;  Location: [place] [facility];  Service: Endoscopy;;   REVISION OF ARTERIOVENOUS GORETEX GRAFT Left [id number]   Procedure: PLICATION OF ANEURYSM OF ARTERIOVENOUS FISTULA  LEFT ARM;  Surgeon: Rosetta Posner, MD;  Location: [facility] OR;  Service: Vascular;  Laterality: Left;   REVISON OF ARTERIOVENOUS FISTULA Left 09/07/2019   Procedure: REVISON OF LEFT UPPER ARM ARTERIOVENOUS FISTULA;  Surgeon: Rosetta Posner, MD;  Location: [place];  Service:  Vascular;  Laterality: Left;   REVISON OF ARTERIOVENOUS FISTULA Left 04/09/2022   Procedure: LEFT ARM FISTULA REVISION;  Surgeon: Cherre Robins, MD;  Location: [place];  Service: Vascular;  Laterality: Left;  PERIPHERAL NERVE BLOCK   REVISON OF ARTERIOVENOUS FISTULA Left 04/16/2022   Procedure: REVISON OF ARTERIOVENOUS FISTULA;  Surgeon: Cherre Robins, MD;  Location: [place];  Service: Vascular;  Laterality: Left;   RIGHT/LEFT HEART CATH AND CORONARY ANGIOGRAPHY N/A 01/03/2021   Procedure: RIGHT/LEFT HEART CATH AND CORONARY ANGIOGRAPHY;  Surgeon: Larey Dresser, MD;  Location: [place] [facility];  Service: Cardiovascular;  Laterality: N/A;   Patient Active Problem List   Diagnosis Date Noted   Porokeratosis  11/10/2022   Depression, recurrent ([place]) 10/06/2022   Lumbar stenosis with neurogenic claudication 09/01/2022   Chronic arthritis 08/11/2022   Tinea pedis 06/09/2022   Orthostatic hypotension 04/15/2022   Elevated troponin 123XX123   Complication associated with dialysis catheter 123XX123   Complication of AV dialysis fistula 04/15/2022   ESRD (end stage renal disease) ([place]) 04/09/2022   Pulmonary emphysema ([employer]) 02/21/2022   Acute on chronic anemia 02/21/2022   Type 2 diabetes mellitus with hyperglycemia, with long-term current use of insulin ([place]) 10/28/2021   Type 2 diabetes mellitus with diabetic polyneuropathy, with long-term current use of insulin ([place]) 10/28/2021   Hypertension associated with diabetes ([place]) 09/16/2021   Chronic bilateral low back pain with bilateral sciatica 09/16/2021   Partial seizure disorder ([place])    Acute respiratory failure with hypoxia ([place]) 07/28/2021   Prolonged QT interval 01/01/2021   Hemoptysis 01/01/2021   Occult blood in stools    Symptomatic bradycardia 12/15/2020   Postural dizziness with presyncope 12/15/2020   Blood in stool 0000000   Acute metabolic encephalopathy [id number]   Altered mental status 08/27/2019   Unresponsive episode 08/27/2019   AF (paroxysmal atrial fibrillation) ([place]) 08/27/2019   Status post coronary artery stent placement    NSTEMI (non-ST elevated myocardial infarction) ([place]) 03/30/2018   Hypertensive heart disease 09/12/2017   Hyperlipidemia associated with type 2 diabetes mellitus ([place]) 09/12/2017   Hyperkalemia 07/20/2017   Chronic diastolic CHF (congestive heart failure) ([place]) 04/14/2017   Hypertensive emergency 04/14/2017   Anemia, chronic renal failure 04/11/2017   Fluid overload 04/11/2017   Diabetic foot ulcer ([place]) 04/11/2017   Cellulitis in diabetic foot ([place]) 01/18/2017   Chest pain 12/16/2016   Hypertensive urgency 12/16/2016   Pressure injury of skin 11/03/2016   Type 2 diabetes mellitus  with chronic kidney disease on chronic dialysis, with long-term current use of insulin ([place]) 10/31/2016   ESRD (end stage renal disease) on dialysis ([place]) 10/30/2016   Atherosclerotic heart disease of native coronary artery with other forms of angina pectoris with elevated troponin     Gastroesophageal reflux disease 08/18/2016   HCAP (healthcare-associated pneumonia)    Non-ST elevation (NSTEMI) myocardial infarction [facility]    Pronation deformity of both feet 02/27/2015   Metatarsal deformity 02/27/2015   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  [place] Interpreter: Eduard Roux  PERTINENT HISTORY:  T2DM, CKD on dialysis, CHF, L3/4 discectomy  PAIN:  Are you having pain? Yes: NPRS scale: 8 /10 Pain location: low back and bil LE to feet Pain description: pain in back, electricity in legs Aggravating factors: everything Relieving factors: nothing  PRECAUTIONS: None  WEIGHT BEARING RESTRICTIONS: No  FALLS:  Has patient fallen in last 6 months? No  LIVING ENVIRONMENT: Lives with: lives with their family Lives in: House/apartment Stairs: Yes: Internal: 13 steps; on left going up and External: 5 steps; on left going up Has following equipment at home: Single point cane, Walker - 2 wheeled, and Environmental consultant - 4  wheeled  OCCUPATION: retired  PLOF: Independent with household mobility with device  PATIENT GOALS: get rid of pain  NEXT MD VISIT: 2 weeks now  OBJECTIVE:   DIAGNOSTIC FINDINGS:  MRI 10/26/22 IMPRESSION: 1. Interval posterior decompression and discectomy at L3-4. There is progressive loss of disc height with probable intradiscal vacuum phenomenon and a broad-based central and right-sided disc protrusion which appears slightly increased from the preoperative study. There is progressive mass effect on the thecal sac which is incompletely decompressed posteriorly. Right foraminal narrowing appears slightly worse with probable right L3 nerve root encroachment. 2. No other significant changes are identified. Multilevel spondylosis superimposed on congenitally short pedicles with resulting chronic multifactorial spinal stenosis from L1-2 through L4-5. 3. Reactive endplate edema at [id number] without evidence of discitis/osteomyelitis.  PATIENT SURVEYS:  Modified Oswestry  40 / 50 = 80.0 %   SCREENING FOR RED FLAGS: Bowel or bladder incontinence: No Spinal tumors: No Cauda equina syndrome: No Compression fracture: No Abdominal aneurysm: No  COGNITION: Overall cognitive status: Within functional limits for tasks assessed     SENSATION: WFL  MUSCLE LENGTH: HS: tight bil L> R Piriformis: bil L> R Hip Flexors: NT, but likely tight due to posture in standing Heelcords: WFL   POSTURE: rounded shoulders, forward head, decreased lumbar lordosis, increased thoracic kyphosis, and posterior pelvic tilt  PALPATION: Palpation: TTP at Bil gluteals L >R, bil lumbar.  Spinal mobility NT due to patient's pain level.  LUMBAR ROM:   AROM eval  Flexion Hands to knees  Extension Unable to get to neutral - bends knees to extend backward  Right lateral flexion 80% limited  Left lateral flexion 90% limited  Right rotation 75% limited  Left rotation 80% limited   (Blank rows = not  tested)  LOWER EXTREMITY ROM:   WFL for MMT, hip extension not tested but decreased based on patient's inability to stand in neutral.  LOWER EXTREMITY MMT:    MMT Right eval Left eval  Hip flexion 4 4-  Hip extension 4-* 4-*  Hip abduction 5 4+  Hip adduction 4 4-  Hip internal rotation    Hip external rotation    Knee flexion 5 4+  Knee extension 4+ 4  Ankle dorsiflexion 5 5   (Blank rows = not tested)   FUNCTIONAL TESTS:  30 seconds chair stand test: 12/09/22: score is 0 - able to do 3x w/o hands but used hands 1x  GAIT: Distance walked: 40 Assistive device utilized: Walker - 4 wheeled Level of assistance: Complete Independence Comments: flexed trunk with walker too far ahead of him  TODAY'S TREATMENT:  DATE:   12/09/22 Therapeutic Exercise: Nustep L4x25min Seated hamstring stretch with chair x 30 sec BLE Seated piriformis stretch x 30 sec BLE Seated trunk rotations x 10 bil Standing trunk extension at wall attempted but pain Seated posterior pelvic tilts 10x5" Seated trunk extension with shoulder flexion x 10 Seated horiz ABD with extension x 10  Gait Training: 90 ft no AD- flexed trunk fatigue after 70' : 90 ft with rollator better trunk extension and stability  12/07/22 See pt ed  PATIENT EDUCATION:  Education details: HEP update  Person educated: Patient Education method: Explanation, Demonstration, Tactile cues, Verbal cues, and Handouts Education comprehension: verbalized understanding, returned demonstration, verbal cues required, and needs further education  HOME EXERCISE PROGRAM: Access Code: [email or web address] URL: [email or web address] Date: 12/09/2022 Prepared by: Clarene Essex  Exercises - Seated Hamstring Stretch with Chair  - 2 x daily - 7 x weekly - 1 sets - 3 reps - 20-30 sec hold - Seated Figure 4 Piriformis  Stretch  - 2 x daily - 7 x weekly - 1 sets - 3 reps - 30-60 sec hold - Seated Trunk Rotation  - 1 x daily - 7 x weekly - 1 sets - 10 reps - 10 sec hold - Seated Pelvic Tilt  - 1 x daily - 7 x weekly - 2 sets - 10 reps - Seated Thoracic Extension Arms Overhead  - 1 x daily - 7 x weekly - 2 sets - 10 reps  ASSESSMENT:  CLINICAL IMPRESSION: Mr. Orzech came in today without his rollator, ambulating with flexed trunk. He best tolerated seated exercises. He had increased lower back and LE pain with standing trunk extensions. He was able to perform seated trunk extension with no increased pain. He showed fatigue after walking 90 ft with no AD but showed more stability and upright posture with rollator. He was able to 3 STS no UE support but on #4 needed UE support. He would continue to benefit from skilled therapy to address deficits.    OBJECTIVE IMPAIRMENTS: decreased activity tolerance, difficulty walking, decreased ROM, decreased strength, increased muscle spasms, impaired flexibility, postural dysfunction, and pain.   ACTIVITY LIMITATIONS: carrying, lifting, bending, sitting, standing, squatting, sleeping, stairs, transfers, bed mobility, hygiene/grooming, and locomotion level  PARTICIPATION LIMITATIONS:  all ADLs limited  PERSONAL FACTORS: Time since onset of injury/illness/exacerbation and 3+ comorbidities: T2DM, CKD on dialysis, CHF, L3/4 discectomy  are also affecting patient's functional outcome.   REHAB POTENTIAL: Good  CLINICAL DECISION MAKING: Evolving/moderate complexity  EVALUATION COMPLEXITY: Moderate   GOALS: Goals reviewed with patient? Yes  SHORT TERM GOALS: Target date: 01/04/2023   Patient will be independent with initial HEP.  Baseline:  Goal status: IN PROGRESS  2.  Patient will report decreased pain by 25%.  Baseline:   LONG TERM GOALS: Target date: 02/01/2023    Patient will be independent with advanced/ongoing HEP to improve outcomes and carryover.  Baseline:   Goal status: IN PROGRESS  2.  Patient will report 50-75% improvement in low back and leg pain to improve QOL.  Baseline:  Goal status: IN PROGRESS  3.  Patient will demonstrate functional lumbar ROM to perform ADLs.   Baseline:  Goal status: IN PROGRESS  4.  Patient will demonstrate improved functional strength by increased 30 second chair stand test by 5. Baseline: TBD Goal status: IN PROGRESS  5.  Patient will report 34 on modified oswestry to demonstrate improved functional ability.  Baseline:  40 / 50 = 80.0 % Goal status: IN  PROGRESS   6.  Patient will report ability to sleep a minimum of 5 hours without waking from pain. Baseline:  Goal status: IN PROGRESS     PLAN:  PT FREQUENCY: 2x/week  PT DURATION: 8 weeks  PLANNED INTERVENTIONS: Therapeutic exercises, Therapeutic activity, Neuromuscular re-education, Balance training, Gait training, Patient/Family education, Self Care, Joint mobilization, Aquatic Therapy, Dry Needling, Electrical stimulation, Spinal mobilization, Cryotherapy, Moist heat, scar mobilization, Taping, Ultrasound, Ionotophoresis 4mg /ml Dexamethasone, and Manual therapy.  PLAN FOR NEXT SESSION: Review HEP and progress for lumbar mobility (can't lie on back), LE flexibility and strength. MT/DN? And modalities for pain.     Artist Pais, PTA 12/09/2022, 3:03 PM

## 2022-12-14 NOTE — Anesthesia Preprocedure Evaluation (Signed)
Anesthesia Evaluation  Patient identified by MRN, date of birth, ID band Patient awake    Reviewed: Allergy & Precautions, NPO status , Patient's Chart, lab work & pertinent test results, reviewed documented beta blocker date and time   History of Anesthesia Complications Negative for: history of anesthetic complications  Airway Mallampati: II  TM Distance: >3 FB Neck ROM: Full    Dental  (+) Chipped, Missing, Dental Advisory Given   Pulmonary COPD, former smoker   breath sounds clear to auscultation       Cardiovascular hypertension, Pt. on medications and Pt. on home beta blockers (-) angina + CAD, + Past MI, + Cardiac Stents, + CABG and + Peripheral Vascular Disease  + dysrhythmias Atrial Fibrillation  Rhythm:Irregular Rate:Normal  '22 ECHO: EF 60-65%, normal LVF, hypokinesis of the basal inferior wall with overall preserved LV systolic function. Grade 2 DD, normal RVF, trivial MR  '22 Cath: 1. Low filling pressures.  2. Coronary and graft anatomy unchanged from prior.  LIMA-LAD remains patent, large high OM1 remains patent s/p prior PCI, and both SVGs as well as the RCA are occluded.       Neuro/Psych Seizures -, Well Controlled,    Depression       GI/Hepatic Neg liver ROS,GERD  Medicated and Controlled,,  Endo/Other  diabetes (glu 184), Insulin Dependent    Renal/GU Dialysis and CRFRenal disease (MWF dialysis, K+ 4.3)     Musculoskeletal  (+) Arthritis ,    Abdominal   Peds  Hematology Eliquis: last dose 12/12/2022   Anesthesia Other Findings   Reproductive/Obstetrics                             Anesthesia Physical Anesthesia Plan  ASA: 3  Anesthesia Plan: MAC   Post-op Pain Management: Minimal or no pain anticipated   Induction:   PONV Risk Score and Plan: 1 and Treatment may vary due to age or medical condition  Airway Management Planned: Simple Face Mask and Natural  Airway  Additional Equipment:   Intra-op Plan:   Post-operative Plan:   Informed Consent: I have reviewed the patients History and Physical, chart, labs and discussed the procedure including the risks, benefits and alternatives for the proposed anesthesia with the patient or authorized representative who has indicated his/her understanding and acceptance.     Dental advisory given and Interpreter used for interveiw  Plan Discussed with: CRNA and Surgeon  Anesthesia Plan Comments:         Anesthesia Quick Evaluation

## 2022-12-15 ENCOUNTER — Encounter (HOSPITAL_COMMUNITY): Payer: Self-pay | Admitting: Gastroenterology

## 2022-12-15 ENCOUNTER — Encounter (HOSPITAL_COMMUNITY)
Admission: RE | Disposition: A | Discharge status: Home / Self Care | Payer: Self-pay | Source: Ambulatory Visit | Attending: Gastroenterology

## 2022-12-15 ENCOUNTER — Other Ambulatory Visit: Payer: Self-pay

## 2022-12-15 ENCOUNTER — Ambulatory Visit (HOSPITAL_COMMUNITY)
Admission: RE | Admit: 2022-12-15 | Discharge: 2022-12-15 | Disposition: A | Discharge status: Home / Self Care | Payer: 59 | Source: Ambulatory Visit | Attending: Gastroenterology | Admitting: Gastroenterology

## 2022-12-15 ENCOUNTER — Ambulatory Visit (HOSPITAL_BASED_OUTPATIENT_CLINIC_OR_DEPARTMENT_OTHER): Payer: 59 | Admitting: Registered Nurse

## 2022-12-15 ENCOUNTER — Ambulatory Visit (HOSPITAL_COMMUNITY): Payer: 59 | Admitting: Registered Nurse

## 2022-12-15 DIAGNOSIS — D500 Iron deficiency anemia secondary to blood loss (chronic): Secondary | ICD-10-CM | POA: Diagnosis not present

## 2022-12-15 DIAGNOSIS — Z794 Long term (current) use of insulin: Secondary | ICD-10-CM

## 2022-12-15 DIAGNOSIS — K635 Polyp of colon: Secondary | ICD-10-CM | POA: Diagnosis not present

## 2022-12-15 DIAGNOSIS — D490 Neoplasm of unspecified behavior of digestive system: Secondary | ICD-10-CM

## 2022-12-15 DIAGNOSIS — K5730 Diverticulosis of large intestine without perforation or abscess without bleeding: Secondary | ICD-10-CM | POA: Diagnosis not present

## 2022-12-15 DIAGNOSIS — R195 Other fecal abnormalities: Secondary | ICD-10-CM

## 2022-12-15 DIAGNOSIS — K3189 Other diseases of stomach and duodenum: Secondary | ICD-10-CM | POA: Diagnosis not present

## 2022-12-15 DIAGNOSIS — I2510 Atherosclerotic heart disease of native coronary artery without angina pectoris: Secondary | ICD-10-CM | POA: Diagnosis not present

## 2022-12-15 DIAGNOSIS — I4891 Unspecified atrial fibrillation: Secondary | ICD-10-CM

## 2022-12-15 DIAGNOSIS — D123 Benign neoplasm of transverse colon: Secondary | ICD-10-CM

## 2022-12-15 DIAGNOSIS — K921 Melena: Secondary | ICD-10-CM | POA: Diagnosis present

## 2022-12-15 DIAGNOSIS — Z8719 Personal history of other diseases of the digestive system: Secondary | ICD-10-CM | POA: Diagnosis not present

## 2022-12-15 DIAGNOSIS — Q438 Other specified congenital malformations of intestine: Secondary | ICD-10-CM | POA: Diagnosis not present

## 2022-12-15 DIAGNOSIS — K2950 Unspecified chronic gastritis without bleeding: Secondary | ICD-10-CM

## 2022-12-15 DIAGNOSIS — J449 Chronic obstructive pulmonary disease, unspecified: Secondary | ICD-10-CM | POA: Diagnosis not present

## 2022-12-15 DIAGNOSIS — E1122 Type 2 diabetes mellitus with diabetic chronic kidney disease: Secondary | ICD-10-CM | POA: Insufficient documentation

## 2022-12-15 DIAGNOSIS — I132 Hypertensive heart and chronic kidney disease with heart failure and with stage 5 chronic kidney disease, or end stage renal disease: Secondary | ICD-10-CM | POA: Diagnosis not present

## 2022-12-15 DIAGNOSIS — N186 End stage renal disease: Secondary | ICD-10-CM | POA: Diagnosis not present

## 2022-12-15 DIAGNOSIS — I252 Old myocardial infarction: Secondary | ICD-10-CM | POA: Diagnosis not present

## 2022-12-15 DIAGNOSIS — D122 Benign neoplasm of ascending colon: Secondary | ICD-10-CM

## 2022-12-15 DIAGNOSIS — Z951 Presence of aortocoronary bypass graft: Secondary | ICD-10-CM | POA: Diagnosis not present

## 2022-12-15 DIAGNOSIS — Z955 Presence of coronary angioplasty implant and graft: Secondary | ICD-10-CM | POA: Diagnosis not present

## 2022-12-15 DIAGNOSIS — E1151 Type 2 diabetes mellitus with diabetic peripheral angiopathy without gangrene: Secondary | ICD-10-CM | POA: Insufficient documentation

## 2022-12-15 DIAGNOSIS — I509 Heart failure, unspecified: Secondary | ICD-10-CM | POA: Diagnosis not present

## 2022-12-15 DIAGNOSIS — Z7901 Long term (current) use of anticoagulants: Secondary | ICD-10-CM | POA: Insufficient documentation

## 2022-12-15 DIAGNOSIS — Z992 Dependence on renal dialysis: Secondary | ICD-10-CM | POA: Diagnosis not present

## 2022-12-15 DIAGNOSIS — D631 Anemia in chronic kidney disease: Secondary | ICD-10-CM | POA: Diagnosis not present

## 2022-12-15 DIAGNOSIS — D120 Benign neoplasm of cecum: Secondary | ICD-10-CM

## 2022-12-15 DIAGNOSIS — Z87891 Personal history of nicotine dependence: Secondary | ICD-10-CM | POA: Diagnosis not present

## 2022-12-15 DIAGNOSIS — K31A0 Gastric intestinal metaplasia, unspecified: Secondary | ICD-10-CM

## 2022-12-15 DIAGNOSIS — K219 Gastro-esophageal reflux disease without esophagitis: Secondary | ICD-10-CM | POA: Diagnosis not present

## 2022-12-15 HISTORY — PX: HEMOSTASIS CLIP PLACEMENT: SHX6857

## 2022-12-15 HISTORY — PX: COLONOSCOPY WITH PROPOFOL: SHX5780

## 2022-12-15 HISTORY — PX: BIOPSY: SHX5522

## 2022-12-15 HISTORY — PX: ESOPHAGOGASTRODUODENOSCOPY (EGD) WITH PROPOFOL: SHX5813

## 2022-12-15 HISTORY — PX: POLYPECTOMY: SHX5525

## 2022-12-15 LAB — POCT I-STAT, CHEM 8
BUN: 18 mg/dL (ref 8–23)
Calcium, Ion: 1.03 mmol/L — ABNORMAL LOW (ref 1.15–1.40)
Chloride: 97 mmol/L — ABNORMAL LOW (ref 98–111)
Creatinine, Ser: 5.2 mg/dL — ABNORMAL HIGH (ref 0.61–1.24)
Glucose, Bld: 184 mg/dL — ABNORMAL HIGH (ref 70–99)
HCT: 38 % — ABNORMAL LOW (ref 39.0–52.0)
Hemoglobin: 12.9 g/dL — ABNORMAL LOW (ref 13.0–17.0)
Potassium: 4.3 mmol/L (ref 3.5–5.1)
Sodium: 138 mmol/L (ref 135–145)
TCO2: 31 mmol/L (ref 22–32)

## 2022-12-15 SURGERY — COLONOSCOPY WITH PROPOFOL
Anesthesia: Monitor Anesthesia Care

## 2022-12-15 MED ORDER — PROPOFOL 500 MG/50ML IV EMUL
INTRAVENOUS | Status: DC | PRN
  Administered 2022-12-15: 130 ug/kg/min via INTRAVENOUS

## 2022-12-15 MED ORDER — EPHEDRINE SULFATE-NACL 50-0.9 MG/10ML-% IV SOSY
PREFILLED_SYRINGE | INTRAVENOUS | Status: DC | PRN
  Administered 2022-12-15: 5 mg via INTRAVENOUS

## 2022-12-15 MED ORDER — PROPOFOL 1000 MG/100ML IV EMUL
INTRAVENOUS | Status: AC
  Filled 2022-12-15: qty 100

## 2022-12-15 MED ORDER — SODIUM CHLORIDE 0.9 % IV SOLN: INTRAVENOUS | Status: DC

## 2022-12-15 SURGICAL SUPPLY — 25 items

## 2022-12-15 NOTE — Transfer of Care (Signed)
Immediate Anesthesia Transfer of Care Note  Patient: OREE TOMAINO  Procedure(s) Performed: COLONOSCOPY WITH PROPOFOL ESOPHAGOGASTRODUODENOSCOPY (EGD) WITH PROPOFOL BIOPSY POLYPECTOMY HEMOSTASIS CLIP PLACEMENT  Patient Location: PACU and Endoscopy Unit  Anesthesia Type:MAC  Level of Consciousness: drowsy and patient cooperative  Airway & Oxygen Therapy: Patient Spontanous Breathing and Patient connected to face mask oxygen  Post-op Assessment: Report given to RN, Post -op Vital signs reviewed and stable, and Patient moving all extremities  Post vital signs: Reviewed and stable  Last Vitals:  Vitals Value Taken Time  BP    Temp    Pulse 47 12/15/22 0931  Resp 12 12/15/22 0931  SpO2 100 % 12/15/22 0931  Vitals shown include unvalidated device data.  Last Pain:  Vitals:   12/15/22 0719  TempSrc: Tympanic  PainSc: 0-No pain         Complications: No notable events documented.

## 2022-12-15 NOTE — Op Note (Signed)
[facility] Patient Name: Kenneth [name] Procedure Date: 12/15/2022 MRN: [id number] Attending MD: Estill Cotta. Loletha Carrow , MD, [id number] Date of Birth: [birth year] CSN: [id number] Age: 67 Admit Type: Outpatient Procedure:                Colonoscopy Indications:              Heme positive stool                           Anemia and hemodialysis patient.                           Clinical details in 11/07/2022 [place] office                            consult note                           Of note, patient iron studies were normal at the                            time of that consultation. Providers:                Estill Cotta. Loletha Carrow, MD, Fanny Skates RN, RN, William Dalton, Technician Referring MD:             Crosby Oyster Wendling Medicines:                 Complications:            No immediate complications. Estimated Blood Loss:     Estimated blood loss was minimal. Procedure:                Pre-Anesthesia Assessment:                           - Prior to the procedure, a History and Physical                            was performed, and patient medications and                            allergies were reviewed. [name] tolerance of                            previous anesthesia was also reviewed. The risks                            and benefits of the procedure and the sedation                            options and risks were discussed with the patient.                            All questions were answered, and informed consent  was obtained. Prior Anticoagulants: The patient has                            taken Eliquis (apixaban), last dose was 2 days                            prior to procedure. ASA Grade Assessment: IV - A                            patient with severe systemic disease that is a                            constant threat to life. After reviewing the risks                            and benefits,  the patient was deemed in                            satisfactory condition to undergo the procedure.                           After obtaining informed consent, the colonoscope                            was passed under direct vision. Throughout the                            procedure, the patient's blood pressure, pulse, and                            oxygen saturations were monitored continuously. The                            [id number]) Olympus colonoscope was                            introduced through the anus and advanced to the the                            terminal ileum, with identification of the                            appendiceal orifice and IC valve. The colonoscopy                            was performed with difficulty due to and initially                            poor bowel prep, a redundant colon and significant                            looping. Successful completion of the procedure was  aided by using manual pressure, straightening and                            shortening the scope to obtain bowel loop reduction                            and a large amount of lavage. The patient tolerated                            the procedure well. The quality of the bowel                            preparation was fair in some areas despite lavage                            due to retained fibrous debris (see photos). The                            terminal ileum, ileocecal valve, appendiceal                            orifice, and rectum were photographed. The bowel                            preparation used was GoLYTELY via split dose                            instruction. Scope In: 8:28:38 AM Scope Out: 9:02:43 AM Scope Withdrawal Time: 0 hours 24 minutes 56 seconds  Total Procedure Duration: 0 hours 34 minutes 5 seconds  Findings:      The perianal and digital rectal examinations were normal.      The sigmoid colon was  redundant.      The terminal ileum appeared normal.      A 15 mm polypoid lesion was found at the ileocecal valve. The lesion was       sessile. No bleeding was present. Biopsies were taken with a cold       forceps for histology. This sessile lesion was extending into and       indistinguishable from the ileal mucosa.      A single diverticulum was found in the right colon.      A 10 mm polyp was found in the hepatic flexure. The polyp was sessile.       The polyp was removed with a piecemeal technique using a cold snare.       Resection was complete, but the polyp tissue was only partially       retrieved (difficult to locate polyp tissue in the suction trap due to       fibrous debris). To prevent bleeding post-intervention, one hemostatic       clip was successfully placed (MR conditional).      The exam was otherwise without abnormality on direct and retroflexion       views. Impression:               - Preparation of the colon was fair.                           -  Redundant colon.                           - The examined portion of the ileum was normal.                           - Polypoid lesion at the ileocecal valve. Biopsied.                           - Diverticulosis in the right colon.                           - One 10 mm polyp at the hepatic flexure, removed                            piecemeal using a cold snare. Complete resection.                            Partial retrieval. Clip (MR conditional) was placed.                           - The examination was otherwise normal on direct                            and retroflexion views. Moderate Sedation:      MAC sedation used Recommendation:           - Patient has a contact number available for                            emergencies. The signs and symptoms of potential                            delayed complications were discussed with the                            patient. Return to normal activities tomorrow.                             Written discharge instructions were provided to the                            patient.                           - Resume previous diet.                           - Resume Eliquis (apixaban) at prior dose tomorrow.                           - Await pathology results.                           - Repeat colonoscopy is recommended for  surveillance. The colonoscopy date will be                            determined after pathology results from today's                            exam become available for review.                           - See the other procedure note for documentation of                            additional recommendations. Procedure Code(s):        --- Professional ---                           [phone], Colonoscopy, flexible; with removal of                            tumor(s), polyp(s), or other lesion(s) by snare                            technique                           45380, 5, Colonoscopy, flexible; with biopsy,                            single or multiple Diagnosis Code(s):        --- Professional ---                           D49.0, Neoplasm of unspecified behavior of                            digestive system                           D12.3, Benign neoplasm of transverse colon (hepatic                            flexure or splenic flexure)                           R19.5, Other fecal abnormalities                           K57.30, Diverticulosis of large intestine without                            perforation or abscess without bleeding                           Q43.8, Other specified congenital malformations of                            intestine CPT copyright 2022 [employer].  All rights reserved. The codes documented in this report are preliminary and upon coder review may  be revised to meet current compliance requirements. Deliana Avalos L. Loletha Carrow, MD 12/15/2022 9:35:47 AM This report has been  signed electronically. Number of Addenda: 0

## 2022-12-15 NOTE — Interval H&P Note (Signed)
History and Physical Interval Note:  12/15/2022 8:15 AM  Kenneth [name]  has presented today for surgery, with the diagnosis of positive FOBT, Anemia.  The various methods of treatment have been discussed with the patient and family. After consideration of risks, benefits and other options for treatment, the patient has consented to  Procedure(s): COLONOSCOPY WITH PROPOFOL (N/A) ESOPHAGOGASTRODUODENOSCOPY (EGD) WITH PROPOFOL (N/A) as a surgical intervention.  [name] history has been reviewed, patient examined, no change in status, stable for surgery.  I have reviewed the patient's chart and labs.  Questions were answered to the patient's satisfaction.     Nelida Meuse III

## 2022-12-15 NOTE — Therapy (Signed)
OUTPATIENT PHYSICAL THERAPY TREATMENT   Patient Name: Kenneth [name] MRN: [id number] DOB:19-Apr-1956, 67 y.o., male Today's Date: 12/16/2022  END OF SESSION:  PT End of Session - 12/16/22 1322     Visit Number 3    Date for PT Re-Evaluation 02/01/23    Authorization Type [employer] MCR    Progress Note Due on Visit 10    PT Start Time 1324    PT Stop Time 1419    PT Time Calculation (min) 55 min    Activity Tolerance Patient tolerated treatment well    Behavior During Therapy WFL for tasks assessed/performed              Past Medical History:  Diagnosis Date   Arthritis    hands   CAD (coronary artery disease) 10/30/2016   NSTEMI 9/17 with CABG x 3 (LIMA-LAD, SVG-OM, SVG-RCA).   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   type 1 DM   Dyspnea    with exertion   ESRD on hemodialysis    "MWF; [place]" (03/30/2018)   Gastroesophageal reflux disease 08/18/2016   Hyperlipidemia    Hypertension    Ischemic rest pain of lower extremity 02/06/2015   Keratoma 02/27/2015   Metatarsal deformity 02/27/2015   PAF (paroxysmal atrial fibrillation)    Pronation deformity of both feet 02/27/2015   Past Surgical History:  Procedure Laterality Date   A/V FISTULAGRAM Left 03/06/2021   Procedure: A/V FISTULAGRAM;  Surgeon: Marty Heck, MD;  Location: [place] [facility];  Service: Cardiovascular;  Laterality: Left;   A/V FISTULAGRAM Left 06/02/2022   Procedure: A/V Fistulagram;  Surgeon: Serafina Mitchell, MD;  Location: [place] [facility];  Service: Cardiovascular;  Laterality: Left;   AV FISTULA PLACEMENT Left 06/22/2016   Procedure: ARTERIOVENOUS (AV) FISTULA CREATION LEFT UPPER ARM;  Surgeon: Rosetta Posner, MD;  Location: [place];  Service: Vascular;  Laterality: Left;   BIOPSY  12/19/2020   Procedure: BIOPSY;  Surgeon: Ladene Artist, MD;  Location: [place];   Service: Endoscopy;;   CARDIAC CATHETERIZATION N/A 05/31/2016   Procedure: Left Heart Cath and Coronary Angiography;  Surgeon: Jettie Booze, MD;  Location: [place] [facility];  Service: Cardiovascular;  Laterality: N/A;   CARDIAC CATHETERIZATION N/A 05/31/2016   Procedure: Right Heart Cath;  Surgeon: Jettie Booze, MD;  Location: [place] [facility];  Service: Cardiovascular;  Laterality: N/A;   CARDIAC CATHETERIZATION N/A 05/31/2016   Procedure: IABP Insertion;  Surgeon: Jettie Booze, MD;  Location: [place] [facility];  Service: Cardiovascular;  Laterality: N/A;   COLONOSCOPY WITH PROPOFOL N/A 12/20/2020   Procedure: COLONOSCOPY WITH PROPOFOL;  Surgeon: Mauri Pole, MD;  Location: [place] [facility];  Service: Endoscopy;  Laterality: N/A;   CORONARY ARTERY BYPASS GRAFT N/A 06/05/2016   Procedure: CORONARY ARTERY BYPASS GRAFTING (CABG) x3 LIMA to LAD -SVG to OM -SVG to RCA;  Surgeon: Ivin Poot, MD;  Location: [place];  Service: Open Heart Surgery;  Laterality: N/A;   CORONARY STENT INTERVENTION N/A 07/07/2017   Procedure: CORONARY STENT INTERVENTION;  Surgeon: Wellington Hampshire, MD;  Location: [place] [facility];  Service: Cardiovascular;  Laterality: N/A;   CORONARY STENT INTERVENTION N/A 03/30/2018   Procedure: CORONARY STENT INTERVENTION;  Surgeon: Martinique, Peter M, MD;  Location: [place] [facility];  Service: Cardiovascular;  Laterality: N/A;   ESOPHAGOGASTRODUODENOSCOPY (EGD) WITH PROPOFOL N/A 12/19/2020   Procedure: ESOPHAGOGASTRODUODENOSCOPY (EGD) WITH PROPOFOL;  Surgeon: Ladene Artist, MD;  Location: [facility];  Service: Endoscopy;  Laterality: N/A;   EXCHANGE OF A DIALYSIS CATHETER Right 04/16/2022   Procedure: EXCHANGE OF A DIALYSIS CATHETER;  Surgeon: Cherre Robins, MD;  Location: [place];  Service: Vascular;  Laterality: Right;   GIVENS CAPSULE STUDY N/A 12/20/2020   Procedure: GIVENS CAPSULE STUDY;  Surgeon: Mauri Pole, MD;  Location: [place] [facility];   Service: Endoscopy;  Laterality: N/A;   INSERTION OF DIALYSIS CATHETER N/A 06/05/2016   Procedure: INSERTION OF DIALYSIS/trialysis CATHETER;  Surgeon: Ivin Poot, MD;  Location: [place];  Service: Vascular;  Laterality: N/A;   INSERTION OF DIALYSIS CATHETER Right 06/13/2016   Procedure: INSERTION OF DIALYSIS CATHETER RIGHT INTERNAL JUGULAR;  Surgeon: Conrad Greenway, MD;  Location: [place];  Service: Vascular;  Laterality: Right;   INSERTION OF DIALYSIS CATHETER Right 09/07/2019   Procedure: Insertion Of Dialysis Catheter;  Surgeon: Rosetta Posner, MD;  Location: [facility];  Service: Vascular;  Laterality: Right;   INSERTION OF DIALYSIS CATHETER N/A 04/09/2022   Procedure: INSERTION OF TUNNELED DIALYSIS CATHETER;  Surgeon: Cherre Robins, MD;  Location: [place];  Service: Vascular;  Laterality: N/A;   INTRAOPERATIVE TRANSESOPHAGEAL ECHOCARDIOGRAM N/A 06/05/2016   Procedure: INTRAOPERATIVE TRANSESOPHAGEAL ECHOCARDIOGRAM;  Surgeon: Ivin Poot, MD;  Location: [place];  Service: Open Heart Surgery;  Laterality: N/A;   LEFT HEART CATH AND CORS/GRAFTS ANGIOGRAPHY N/A 11/03/2016   Procedure: Left Heart Cath and Cors/Grafts Angiography;  Surgeon: Troy Sine, MD;  Location: [place] [facility];  Service: Cardiovascular;  Laterality: N/A;   LEFT HEART CATH AND CORS/GRAFTS ANGIOGRAPHY N/A 07/07/2017   Procedure: LEFT HEART CATH AND CORS/GRAFTS ANGIOGRAPHY;  Surgeon: Larey Dresser, MD;  Location: [place] [facility];  Service: Cardiovascular;  Laterality: N/A;   LEFT HEART CATH AND CORS/GRAFTS ANGIOGRAPHY N/A 03/30/2018   Procedure: LEFT HEART CATH AND CORS/GRAFTS ANGIOGRAPHY;  Surgeon: Martinique, Peter M, MD;  Location: [place] [facility];  Service: Cardiovascular;  Laterality: N/A;   LEFT HEART CATH AND CORS/GRAFTS ANGIOGRAPHY N/A 04/17/2019   Procedure: LEFT HEART CATH AND CORS/GRAFTS ANGIOGRAPHY;  Surgeon: Larey Dresser, MD;  Location: [place] [facility];  Service: Cardiovascular;  Laterality: N/A;   LUMBAR  LAMINECTOMY/DECOMPRESSION MICRODISCECTOMY Bilateral 09/01/2022   Procedure: Laminectomy and Foraminotomy - bilateral - Lumbar three-Lumbar four;  Surgeon: Earnie Larsson, MD;  Location: [place];  Service: Neurosurgery;  Laterality: Bilateral;   PERIPHERAL VASCULAR BALLOON ANGIOPLASTY Left 06/02/2022   Procedure: PERIPHERAL VASCULAR BALLOON ANGIOPLASTY;  Surgeon: Serafina Mitchell, MD;  Location: [place] [facility];  Service: Cardiovascular;  Laterality: Left;   PERIPHERAL VASCULAR INTERVENTION Left 03/06/2021   Procedure: PERIPHERAL VASCULAR INTERVENTION;  Surgeon: Marty Heck, MD;  Location: [place] [facility];  Service: Cardiovascular;  Laterality: Left;   POLYPECTOMY  12/20/2020   Procedure: POLYPECTOMY;  Surgeon: Mauri Pole, MD;  Location: [place] [facility];  Service: Endoscopy;;   REVISION OF ARTERIOVENOUS GORETEX GRAFT Left [id number]   Procedure: PLICATION OF ANEURYSM OF ARTERIOVENOUS FISTULA  LEFT ARM;  Surgeon: Rosetta Posner, MD;  [facility];  Service: Vascular;  Laterality: Left;   REVISON OF ARTERIOVENOUS FISTULA Left 09/07/2019   Procedure: REVISON OF LEFT UPPER ARM ARTERIOVENOUS FISTULA;  Surgeon: Rosetta Posner, MD;  Location: [place];  Service: Vascular;  Laterality: Left;  REVISON OF ARTERIOVENOUS FISTULA Left 04/09/2022   Procedure: LEFT ARM FISTULA REVISION;  Surgeon: Cherre Robins, MD;  Location: [place];  Service: Vascular;  Laterality: Left;  PERIPHERAL NERVE BLOCK   REVISON OF ARTERIOVENOUS FISTULA Left 04/16/2022   Procedure: REVISON OF ARTERIOVENOUS FISTULA;  Surgeon: Cherre Robins, MD;  Location: [place];  Service: Vascular;  Laterality: Left;   RIGHT/LEFT HEART CATH AND CORONARY ANGIOGRAPHY N/A 01/03/2021   Procedure: RIGHT/LEFT HEART CATH AND CORONARY ANGIOGRAPHY;  Surgeon: Larey Dresser, MD;  Location: [place] [facility];  Service: Cardiovascular;  Laterality: N/A;   Patient Active Problem List   Diagnosis Date Noted   Gastric intestinal metaplasia 12/15/2022    Benign neoplasm of ascending colon 12/15/2022   Benign neoplasm of ileocecal valve 12/15/2022   Porokeratosis 11/10/2022   Depression, recurrent 10/06/2022   Lumbar stenosis with neurogenic claudication 09/01/2022   Chronic arthritis 08/11/2022   Tinea pedis 06/09/2022   Orthostatic hypotension 04/15/2022   Elevated troponin 123XX123   Complication associated with dialysis catheter 123XX123   Complication of AV dialysis fistula 04/15/2022   ESRD (end stage renal disease) 04/09/2022   Pulmonary emphysema 02/21/2022   Acute on chronic anemia 02/21/2022   Type 2 diabetes mellitus with hyperglycemia, with long-term current use of insulin 10/28/2021   Type 2 diabetes mellitus with diabetic polyneuropathy, with long-term current use of insulin 10/28/2021   Hypertension associated with diabetes 09/16/2021   Chronic bilateral low back pain with bilateral sciatica 09/16/2021   Partial seizure disorder    Acute respiratory failure with hypoxia 07/28/2021   Prolonged QT interval 01/01/2021   Hemoptysis 01/01/2021   Positive fecal occult blood test    Symptomatic bradycardia 12/15/2020   Postural dizziness with presyncope 12/15/2020   Blood in stool 0000000   Acute metabolic encephalopathy [id number]   Altered mental status 08/27/2019   Unresponsive episode 08/27/2019   AF (paroxysmal atrial fibrillation) 08/27/2019   Status post coronary artery stent placement    NSTEMI (non-ST elevated myocardial infarction) 03/30/2018   Hypertensive heart disease 09/12/2017   Hyperlipidemia associated with type 2 diabetes mellitus 09/12/2017   Hyperkalemia 07/20/2017   Chronic diastolic CHF (congestive heart failure) 04/14/2017   Hypertensive emergency 04/14/2017   Anemia, chronic renal failure 04/11/2017   Fluid overload 04/11/2017   Diabetic foot ulcer 04/11/2017   Cellulitis in diabetic foot 01/18/2017   Chest pain 12/16/2016   Hypertensive urgency 12/16/2016   Pressure injury of skin  11/03/2016   Type 2 diabetes mellitus with chronic kidney disease on chronic dialysis, with long-term current use of insulin 10/31/2016   ESRD (end stage renal disease) on dialysis 10/30/2016   Atherosclerotic heart disease of native coronary artery with other forms of angina pectoris with elevated troponin     Gastroesophageal reflux disease 08/18/2016   HCAP (healthcare-associated pneumonia)    Non-ST elevation (NSTEMI) myocardial infarction    Pronation deformity of both feet 02/27/2015   Metatarsal deformity 02/27/2015   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[place]) 02/06/2015   Ischemic rest pain of lower extremity ([place]) 02/06/2015    PCP: Shelda Pal, DO   REFERRING PROVIDER: Earnie Larsson, MD   REFERRING DIAG: [phone] (ICD-10-CM) - Spinal stenosis, lumbar region with neurogenic claudication   Rationale for Evaluation and Treatment: Rehabilitation  THERAPY DIAG:  Radiculopathy, lumbar region  Cramp and spasm  Muscle weakness (generalized)  ONSET DATE: 3 weeks  SUBJECTIVE:  SUBJECTIVE STATEMENT: Pain is a little better today. Took two pain meds at lunch today.  [place] Interpreter: Eduard Roux  PERTINENT HISTORY:  T2DM, CKD on dialysis, CHF, L3/4 discectomy  PAIN:  Are you having pain? Yes: NPRS scale: 6/10 Pain location: low back and bil LE to feet Pain description: pain in back, electricity in legs Aggravating factors: everything Relieving factors: nothing  PRECAUTIONS: None  WEIGHT BEARING RESTRICTIONS: No  FALLS:  Has patient fallen in last 6 months? No  LIVING ENVIRONMENT: Lives with: lives with their family Lives in: House/apartment Stairs: Yes: Internal: 13 steps; on left going up and External: 5 steps; on left going up Has following equipment at home: Single  point cane, Walker - 2 wheeled, and Environmental consultant - 4 wheeled  OCCUPATION: retired  PLOF: Independent with household mobility with device  PATIENT GOALS: get rid of pain  NEXT MD VISIT: 2 weeks now  OBJECTIVE:   DIAGNOSTIC FINDINGS:  MRI 10/26/22 IMPRESSION: 1. Interval posterior decompression and discectomy at L3-4. There is progressive loss of disc height with probable intradiscal vacuum phenomenon and a broad-based central and right-sided disc protrusion which appears slightly increased from the preoperative study. There is progressive mass effect on the thecal sac which is incompletely decompressed posteriorly. Right foraminal narrowing appears slightly worse with probable right L3 nerve root encroachment. 2. No other significant changes are identified. Multilevel spondylosis superimposed on congenitally short pedicles with resulting chronic multifactorial spinal stenosis from L1-2 through L4-5. 3. Reactive endplate edema at [id number] without evidence of discitis/osteomyelitis.  PATIENT SURVEYS:  Modified Oswestry  40 / 50 = 80.0 %   SCREENING FOR RED FLAGS: Bowel or bladder incontinence: No Spinal tumors: No Cauda equina syndrome: No Compression fracture: No Abdominal aneurysm: No  COGNITION: Overall cognitive status: Within functional limits for tasks assessed     SENSATION: WFL  MUSCLE LENGTH: HS: tight bil L> R Piriformis: bil L> R Hip Flexors: NT, but likely tight due to posture in standing Heelcords: WFL   POSTURE: rounded shoulders, forward head, decreased lumbar lordosis, increased thoracic kyphosis, and posterior pelvic tilt  PALPATION: Palpation: TTP at Bil gluteals L >R, bil lumbar.  Spinal mobility NT due to patient's pain level.  LUMBAR ROM:   AROM eval  Flexion Hands to knees  Extension Unable to get to neutral - bends knees to extend backward  Right lateral flexion 80% limited  Left lateral flexion 90% limited  Right rotation 75% limited  Left  rotation 80% limited   (Blank rows = not tested)  LOWER EXTREMITY ROM:   WFL for MMT, hip extension not tested but decreased based on patient's inability to stand in neutral.  LOWER EXTREMITY MMT:    MMT Right eval Left eval  Hip flexion 4 4-  Hip extension 4-* 4-*  Hip abduction 5 4+  Hip adduction 4 4-  Hip internal rotation    Hip external rotation    Knee flexion 5 4+  Knee extension 4+ 4  Ankle dorsiflexion 5 5   (Blank rows = not tested)   FUNCTIONAL TESTS:  30 seconds chair stand test: 12/09/22: score is 0 - able to do 3x w/o hands but used hands 1x  GAIT: Distance walked: 40 Assistive device utilized: Walker - 4 wheeled Level of assistance: Complete Independence Comments: flexed trunk with walker too far ahead of him  TODAY'S TREATMENT:  DATE:  12/16/22 Therapeutic Exercise: to improve flexibility, strength and mobility.  Verbal and tactile cues throughout for technique Nustep L4 x 6 min Seated piriformis stretch x 30 sec BLE Seated trunk rotations x 10 bil Seated diagonals with ball x 5 ea way Seated trunk extension with shoulder flexion x 10 Seated horiz ABD with RTB x 10 Seated PPT x 10  Manual Therapy: to decrease muscle spasm and pain and improve mobility Skilled palpation and monitoring of soft tissues during DN STM to bil gluteals and piriformis post DN Trigger Point Dry-Needling  Treatment instructions: Expect mild to moderate muscle soreness. S/S of pneumothorax if dry needled over a lung field, and to seek immediate medical attention should they occur. Patient verbalized understanding of these instructions and education. Patient Consent Given: Yes Education handout provided: Yes Muscles treated: bil gluteals and piriformis Electrical stimulation performed: No Parameters: N/A Treatment response/outcome: Twitch Response Elicited  and Palpable Increase in Muscle Length  Modalities: MHP to bil gluteals and LB in sitting x 10 min post MT   12/09/22 Therapeutic Exercise: Nustep L4x71min Seated hamstring stretch with chair x 30 sec BLE Seated piriformis stretch x 30 sec BLE Seated trunk rotations x 10 bil Standing trunk extension at wall attempted but pain Seated posterior pelvic tilts 10x5" Seated trunk extension with shoulder flexion x 10 Seated horiz ABD with extension x 10  Gait Training: 90 ft no AD- flexed trunk fatigue after 70' : 90 ft with rollator better trunk extension and stability  12/07/22 See pt ed  PATIENT EDUCATION:  Education details: HEP update  Person educated: Patient Education method: Explanation, Demonstration, Tactile cues, Verbal cues, and Handouts Education comprehension: verbalized understanding, returned demonstration, verbal cues required, and needs further education  HOME EXERCISE PROGRAM: Access Code: [email or web address] URL: [email or web address] Date: 12/09/2022 Prepared by: Clarene Essex  Exercises - Seated Hamstring Stretch with Chair  - 2 x daily - 7 x weekly - 1 sets - 3 reps - 20-30 sec hold - Seated Figure 4 Piriformis Stretch  - 2 x daily - 7 x weekly - 1 sets - 3 reps - 30-60 sec hold - Seated Trunk Rotation  - 1 x daily - 7 x weekly - 1 sets - 10 reps - 10 sec hold - Seated Pelvic Tilt  - 1 x daily - 7 x weekly - 2 sets - 10 reps - Seated Thoracic Extension Arms Overhead  - 1 x daily - 7 x weekly - 2 sets - 10 reps  ASSESSMENT:  CLINICAL IMPRESSION: Lonnel reports less pain today. He tolerated exercises well but needed cues to stay in pain free range. Initial trial of DN/MT to bil gluteals and piriformis went very well with good twitch responses and relief reported by Mr. Dolinger immediately after. He was able to perform standing extension without increased pain.    OBJECTIVE IMPAIRMENTS: decreased activity tolerance, difficulty walking, decreased ROM, decreased  strength, increased muscle spasms, impaired flexibility, postural dysfunction, and pain.   ACTIVITY LIMITATIONS: carrying, lifting, bending, sitting, standing, squatting, sleeping, stairs, transfers, bed mobility, hygiene/grooming, and locomotion level  PARTICIPATION LIMITATIONS:  all ADLs limited  PERSONAL FACTORS: Time since onset of injury/illness/exacerbation and 3+ comorbidities: T2DM, CKD on dialysis, CHF, L3/4 discectomy  are also affecting patient's functional outcome.   REHAB POTENTIAL: Good  CLINICAL DECISION MAKING: Evolving/moderate complexity  EVALUATION COMPLEXITY: Moderate   GOALS: Goals reviewed with patient? Yes  SHORT TERM GOALS: Target date: 01/04/2023   Patient will be independent with initial HEP.  Baseline:  Goal status: IN PROGRESS  2.  Patient will report decreased pain by 25%.  Baseline:  Goal status: IN PROGRESS  LONG TERM GOALS: Target date: 02/01/2023    Patient will be independent with advanced/ongoing HEP to improve outcomes and carryover.  Baseline:  Goal status: IN PROGRESS  2.  Patient will report 50-75% improvement in low back and leg pain to improve QOL.  Baseline:  Goal status: IN PROGRESS  3.  Patient will demonstrate functional lumbar ROM to perform ADLs.   Baseline:  Goal status: IN PROGRESS  4.  Patient will demonstrate improved functional strength by increased 30 second chair stand test by 5. Baseline: 0 Goal status: IN PROGRESS  5.  Patient will report 34 on modified oswestry to demonstrate improved functional ability.  Baseline:  40 / 50 = 80.0 % Goal status: IN PROGRESS   6.  Patient will report ability to sleep a minimum of 5 hours without waking from pain. Baseline:  Goal status: IN PROGRESS     PLAN:  PT FREQUENCY: 2x/week  PT DURATION: 8 weeks  PLANNED INTERVENTIONS: Therapeutic exercises, Therapeutic activity, Neuromuscular re-education, Balance training, Gait training, Patient/Family education, Self Care,  Joint mobilization, Aquatic Therapy, Dry Needling, Electrical stimulation, Spinal mobilization, Cryotherapy, Moist heat, scar mobilization, Taping, Ultrasound, Ionotophoresis 4mg /ml Dexamethasone, and Manual therapy.  PLAN FOR NEXT SESSION: Assess response to DN, work on lumbar mobility (can't lie on back), LE flexibility and strength.  modalities for pain.     Caprisha Bridgett, PT 12/16/2022, 2:21 PM

## 2022-12-15 NOTE — Discharge Instructions (Signed)
YOU HAD AN ENDOSCOPIC PROCEDURE TODAY: Refer to the procedure report and other information in the discharge instructions given to you for any specific questions about what was found during the examination. If this information does not answer your questions, please call [place] office at [phone] to clarify.  ° °YOU SHOULD EXPECT: Some feelings of bloating in the abdomen. Passage of more gas than usual. Walking can help get rid of the air that was put into your GI tract during the procedure and reduce the bloating. If you had a lower endoscopy (such as a colonoscopy or flexible sigmoidoscopy) you may notice spotting of blood in your stool or on the toilet paper. Some abdominal soreness may be present for a day or two, also. ° °DIET: Your first meal following the procedure should be a light meal and then it is ok to progress to your normal diet. A half-sandwich or bowl of soup is an example of a good first meal. Heavy or fried foods are harder to digest and may make you feel nauseous or bloated. Drink plenty of fluids but you should avoid alcoholic beverages for 24 hours. If you had a esophageal dilation, please see attached instructions for diet.   ° °ACTIVITY: Your care partner should take you home directly after the procedure. You should plan to take it easy, moving slowly for the rest of the day. You can resume normal activity the day after the procedure however YOU SHOULD NOT DRIVE, use power tools, machinery or perform tasks that involve climbing or major physical exertion for 24 hours (because of the sedation medicines used during the test).  ° °SYMPTOMS TO REPORT IMMEDIATELY: °A gastroenterologist can be reached at any hour. Please call [phone]  for any of the following symptoms:  °Following lower endoscopy (colonoscopy, flexible sigmoidoscopy) °Excessive amounts of blood in the stool  °Significant tenderness, worsening of abdominal pains  °Swelling of the abdomen that is new, acute  °Fever of 100° or  higher  °Following upper endoscopy (EGD, EUS, ERCP, esophageal dilation) °Vomiting of blood or coffee ground material  °New, significant abdominal pain  °New, significant chest pain or pain under the shoulder blades  °Painful or persistently difficult swallowing  °New shortness of breath  °Black, tarry-looking or red, bloody stools ° °FOLLOW UP:  °If any biopsies were taken you will be contacted by phone or by letter within the next 1-3 weeks. Call [phone]  if you have not heard about the biopsies in 3 weeks.  °Please also call with any specific questions about appointments or follow up tests. ° °

## 2022-12-15 NOTE — Anesthesia Procedure Notes (Signed)
Procedure Name: MAC Date/Time: 12/15/2022 8:18 AM  Performed by: Victoriano Lain, CRNAPre-anesthesia Checklist: Patient identified, Emergency Drugs available, Suction available, Patient being monitored and Timeout performed Patient Re-evaluated:Patient Re-evaluated prior to induction Oxygen Delivery Method: Simple face mask Placement Confirmation: positive ETCO2 Dental Injury: Teeth and Oropharynx as per pre-operative assessment

## 2022-12-15 NOTE — H&P (Signed)
History and Physical:  This patient presents for endoscopic testing for: Heme positive stool Acute on chronic anemia  67 year old man with multiple medical issues as noted below and also outlined in his [place] office consult note of 11/05/2022. Admission 2 years ago for acute on chronic anemia.  Endoscopic evaluation suggested a possible obscure colonic source I cannot be identified. Patient with slowly declining hemoglobin heme positive stool on oral anticoagulation.   Patient is otherwise without complaints or active issues today.   Past Medical History: Past Medical History:  Diagnosis Date   Arthritis    hands   CAD (coronary artery disease) 10/30/2016   NSTEMI 9/17 with CABG x 3 (LIMA-LAD, SVG-OM, SVG-RCA).   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   type 1 DM   Dyspnea    with exertion   ESRD on hemodialysis    "MWF; [place]" (03/30/2018)   Gastroesophageal reflux disease 08/18/2016   Hyperlipidemia    Hypertension    Ischemic rest pain of lower extremity 02/06/2015   Keratoma 02/27/2015   Metatarsal deformity 02/27/2015   PAF (paroxysmal atrial fibrillation)    Pronation deformity of both feet 02/27/2015     Past Surgical History: Past Surgical History:  Procedure Laterality Date   A/V FISTULAGRAM Left 03/06/2021   Procedure: A/V FISTULAGRAM;  Surgeon: Marty Heck, MD;  Location: [place] [facility];  Service: Cardiovascular;  Laterality: Left;   A/V FISTULAGRAM Left 06/02/2022   Procedure: A/V Fistulagram;  Surgeon: Serafina Mitchell, MD;  Location: [place] [facility];  Service: Cardiovascular;  Laterality: Left;   AV FISTULA PLACEMENT Left 06/22/2016   Procedure: ARTERIOVENOUS (AV) FISTULA CREATION LEFT UPPER ARM;  Surgeon: Rosetta Posner, MD;  Location: [place];  Service: Vascular;  Laterality: Left;   BIOPSY  12/19/2020   Procedure:  BIOPSY;  Surgeon: Ladene Artist, MD;  Location: [place];  Service: Endoscopy;;   CARDIAC CATHETERIZATION N/A 05/31/2016   Procedure: Left Heart Cath and Coronary Angiography;  Surgeon: Jettie Booze, MD;  Location: [place] [facility];  Service: Cardiovascular;  Laterality: N/A;   CARDIAC CATHETERIZATION N/A 05/31/2016   Procedure: Right Heart Cath;  Surgeon: Jettie Booze, MD;  Location: [place] [facility];  Service: Cardiovascular;  Laterality: N/A;   CARDIAC CATHETERIZATION N/A 05/31/2016   Procedure: IABP Insertion;  Surgeon: Jettie Booze, MD;  Location: [place] [facility];  Service: Cardiovascular;  Laterality: N/A;   COLONOSCOPY WITH PROPOFOL N/A 12/20/2020   Procedure: COLONOSCOPY WITH PROPOFOL;  Surgeon: Mauri Pole, MD;  Location: [place] [facility];  Service: Endoscopy;  Laterality: N/A;   CORONARY ARTERY BYPASS GRAFT N/A 06/05/2016   Procedure: CORONARY ARTERY BYPASS GRAFTING (CABG) x3 LIMA to LAD -SVG to OM -SVG to RCA;  Surgeon: Ivin Poot, MD;  Location: [place];  Service: Open Heart Surgery;  Laterality: N/A;   CORONARY STENT INTERVENTION N/A 07/07/2017   Procedure: CORONARY STENT INTERVENTION;  Surgeon: Wellington Hampshire, MD;  Location: [place] [facility];  Service: Cardiovascular;  Laterality: N/A;   CORONARY STENT INTERVENTION N/A 03/30/2018   Procedure: CORONARY STENT INTERVENTION;  Surgeon: Martinique, Peter M, MD;  Location: [place] [facility];  Service: Cardiovascular;  Laterality: N/A;   ESOPHAGOGASTRODUODENOSCOPY (EGD) WITH PROPOFOL N/A 12/19/2020   Procedure: ESOPHAGOGASTRODUODENOSCOPY (EGD) WITH PROPOFOL;  Surgeon: Fuller Plan,  Pricilla Riffle, MD;  Location: [facility];  Service: Endoscopy;  Laterality: N/A;   EXCHANGE OF A DIALYSIS CATHETER Right 04/16/2022   Procedure: EXCHANGE OF A DIALYSIS CATHETER;  Surgeon: Cherre Robins, MD;  Location: [place];  Service: Vascular;  Laterality: Right;   GIVENS CAPSULE STUDY N/A 12/20/2020   Procedure: GIVENS CAPSULE STUDY;   Surgeon: Mauri Pole, MD;  Location: [place] [facility];  Service: Endoscopy;  Laterality: N/A;   INSERTION OF DIALYSIS CATHETER N/A 06/05/2016   Procedure: INSERTION OF DIALYSIS/trialysis CATHETER;  Surgeon: Ivin Poot, MD;  Location: [place];  Service: Vascular;  Laterality: N/A;   INSERTION OF DIALYSIS CATHETER Right 06/13/2016   Procedure: INSERTION OF DIALYSIS CATHETER RIGHT INTERNAL JUGULAR;  Surgeon: Conrad [place]irie, MD;  Location: [place];  Service: Vascular;  Laterality: Right;   INSERTION OF DIALYSIS CATHETER Right 09/07/2019   Procedure: Insertion Of Dialysis Catheter;  Surgeon: Rosetta Posner, MD;  Location: [facility] OR;  Service: Vascular;  Laterality: Right;   INSERTION OF DIALYSIS CATHETER N/A 04/09/2022   Procedure: INSERTION OF TUNNELED DIALYSIS CATHETER;  Surgeon: Cherre Robins, MD;  Location: [place];  Service: Vascular;  Laterality: N/A;   INTRAOPERATIVE TRANSESOPHAGEAL ECHOCARDIOGRAM N/A 06/05/2016   Procedure: INTRAOPERATIVE TRANSESOPHAGEAL ECHOCARDIOGRAM;  Surgeon: Ivin Poot, MD;  Location: [place];  Service: Open Heart Surgery;  Laterality: N/A;   LEFT HEART CATH AND CORS/GRAFTS ANGIOGRAPHY N/A 11/03/2016   Procedure: Left Heart Cath and Cors/Grafts Angiography;  Surgeon: Troy Sine, MD;  Location: [place] [facility];  Service: Cardiovascular;  Laterality: N/A;   LEFT HEART CATH AND CORS/GRAFTS ANGIOGRAPHY N/A 07/07/2017   Procedure: LEFT HEART CATH AND CORS/GRAFTS ANGIOGRAPHY;  Surgeon: Larey Dresser, MD;  Location: [place] [facility];  Service: Cardiovascular;  Laterality: N/A;   LEFT HEART CATH AND CORS/GRAFTS ANGIOGRAPHY N/A 03/30/2018   Procedure: LEFT HEART CATH AND CORS/GRAFTS ANGIOGRAPHY;  Surgeon: Martinique, Peter M, MD;  Location: [place] [facility];  Service: Cardiovascular;  Laterality: N/A;   LEFT HEART CATH AND CORS/GRAFTS ANGIOGRAPHY N/A 04/17/2019   Procedure: LEFT HEART CATH AND CORS/GRAFTS ANGIOGRAPHY;  Surgeon: Larey Dresser, MD;  Location: [place] [facility];  Service: Cardiovascular;  Laterality: N/A;   LUMBAR LAMINECTOMY/DECOMPRESSION MICRODISCECTOMY Bilateral 09/01/2022   Procedure: Laminectomy and Foraminotomy - bilateral - Lumbar three-Lumbar four;  Surgeon: Earnie Larsson, MD;  Location: [place];  Service: Neurosurgery;  Laterality: Bilateral;   PERIPHERAL VASCULAR BALLOON ANGIOPLASTY Left 06/02/2022   Procedure: PERIPHERAL VASCULAR BALLOON ANGIOPLASTY;  Surgeon: Serafina Mitchell, MD;  Location: [place] [facility];  Service: Cardiovascular;  Laterality: Left;   PERIPHERAL VASCULAR INTERVENTION Left 03/06/2021   Procedure: PERIPHERAL VASCULAR INTERVENTION;  Surgeon: Marty Heck, MD;  Location: [place] [facility];  Service: Cardiovascular;  Laterality: Left;   POLYPECTOMY  12/20/2020   Procedure: POLYPECTOMY;  Surgeon: Mauri Pole, MD;  Location: [place] [facility];  Service: Endoscopy;;   REVISION OF ARTERIOVENOUS GORETEX GRAFT Left [id number]   Procedure: PLICATION OF ANEURYSM OF ARTERIOVENOUS FISTULA  LEFT ARM;  Surgeon: Rosetta Posner, MD;  Location: [facility];  Service: Vascular;  Laterality: Left;   REVISON OF ARTERIOVENOUS FISTULA Left 09/07/2019   Procedure: REVISON OF LEFT UPPER ARM ARTERIOVENOUS FISTULA;  Surgeon: Rosetta Posner, MD;  Location: [facility] OR;  Service: Vascular;  Laterality: Left;   REVISON OF ARTERIOVENOUS FISTULA Left 04/09/2022   Procedure: LEFT ARM FISTULA REVISION;  Surgeon: Cherre Robins, MD;  Location: [place];  Service: Vascular;  Laterality: Left;  PERIPHERAL NERVE BLOCK   REVISON OF ARTERIOVENOUS FISTULA Left 04/16/2022   Procedure: REVISON OF ARTERIOVENOUS FISTULA;  Surgeon: Cherre Robins, MD;  Location: [place];  Service: Vascular;  Laterality: Left;   RIGHT/LEFT HEART CATH AND CORONARY ANGIOGRAPHY N/A 01/03/2021   Procedure: RIGHT/LEFT HEART CATH AND CORONARY ANGIOGRAPHY;  Surgeon: Larey Dresser, MD;  Location: [place] [facility];  Service: Cardiovascular;  Laterality: N/A;    Allergies: No Known  Allergies  Outpatient Meds: Current Facility-Administered Medications  Medication Dose Route Frequency Provider Last Rate Last Admin   0.9 %  sodium chloride infusion   Intravenous Continuous Nelida Meuse III, MD 20 mL/hr at 12/15/22 0805 Continued from Pre-op at 12/15/22 0805      ___________________________________________________________________ Objective   Exam:  BP (!) 195/62   Pulse 61   Temp 97.8 F (36.6 C) (Tympanic)   Resp (!) 21   Ht 5\' 3"  (1.6 m)   Wt 62.6 kg   SpO2 97%   BMI 24.45 kg/m   CV: regular , S1/S2 Resp: clear to auscultation bilaterally, normal RR and effort noted GI: soft, no tenderness, with active bowel sounds.   Data:  I-STAT values today show hemoglobin 12.9, hematocrit 38 Sodium 138, potassium 4.3, chloride 97, glucose 184, BUN 18 creatinine 5.2 (patient underwent hemodialysis yesterday)  Assessment: Heme positive stool Chronic blood loss anemia Anemia of chronic kidney disease   Plan: Colonoscopy EGD  The benefits and risks of the planned procedure were described in detail with the patient or (when appropriate) their health care proxy.  Risks were outlined as including, but not limited to, bleeding, infection, perforation, adverse medication reaction leading to cardiac or pulmonary decompensation, pancreatitis (if ERCP).  The limitation of incomplete mucosal visualization was also discussed.  No guarantees or warranties were given.  ([name] present for the entire preprocedure visit)   The patient is appropriate for an endoscopic procedure in the ambulatory setting.   - Wilfrid Lund, MD

## 2022-12-15 NOTE — Op Note (Signed)
[facility] Patient Name: Kenneth [name] Procedure Date: 12/15/2022 MRN: [id number] Attending MD: Estill Cotta. Loletha Carrow , MD, [id number] Date of Birth: 1956/06/19 CSN: [id number] Age: 67 Admit Type: Outpatient Procedure:                Upper GI endoscopy Indications:              Heme positive stool, Anemia and hemodialysis                            patient with normal iron studies                           (Clinical details in 11/07/2022 [facility] office                            consult note)                           Incidental focal gastric intestinal metaplasia                            found on inpatient EGD April 2022 Providers:                Estill Cotta. Loletha Carrow, MD, Fanny Skates RN, RN, William Dalton, Technician Referring MD:             Crosby Oyster Wendling Medicines:                Monitored Anesthesia Care Complications:            No immediate complications. Estimated Blood Loss:     Estimated blood loss was minimal. Procedure:                Pre-Anesthesia Assessment:                           - Prior to the procedure, a History and Physical                            was performed, and patient medications and                            allergies were reviewed. [name] tolerance of                            previous anesthesia was also reviewed. The risks                            and benefits of the procedure and the sedation                            options and risks were discussed with the patient.                            All questions were  answered, and informed consent                            was obtained. Prior Anticoagulants: The patient has                            taken Eliquis (apixaban), last dose was 2 days                            prior to procedure. ASA Grade Assessment: IV - A                            patient with severe systemic disease that is a                            constant threat to life.  After reviewing the risks                            and benefits, the patient was deemed in                            satisfactory condition to undergo the procedure.                           - Prior to the procedure, a History and Physical                            was performed, and patient medications and                            allergies were reviewed. [name] tolerance of                            previous anesthesia was also reviewed. The risks                            and benefits of the procedure and the sedation                            options and risks were discussed with the patient.                            All questions were answered, and informed consent                            was obtained. Prior Anticoagulants: The patient has                            taken Eliquis (apixaban), last dose was 2 days                            prior to procedure. ASA Grade Assessment: IV - A  patient with severe systemic disease that is a                            constant threat to life. After reviewing the risks                            and benefits, the patient was deemed in                            satisfactory condition to undergo the procedure.                           After obtaining informed consent, the endoscope was                            passed under direct vision. Throughout the                            procedure, the patient's blood pressure, pulse, and                            oxygen saturations were monitored continuously. The                            [id number] Olympus endoscope was introduced                            through the mouth, and advanced to the second part                            of duodenum. The upper GI endoscopy was                            accomplished without difficulty. The patient                            tolerated the procedure well. Scope In: Scope Out: Findings:      The  esophagus was normal.      Patchy mildly erythematous mucosa without bleeding was found in the       gastric fundus and in the gastric body. Several biopsies were obtained       on the greater curvature of the gastric body, on the lesser curvature of       the gastric body, at the incisura, on the greater curvature of the       gastric antrum and on the lesser curvature of the gastric antrum with       cold forceps for histology. (Evaluate for extent of intestinal       metaplasia and rule out H. pylori)      Entire stomach examined under white light and NBI, no raised or       otherwise suspicious areas were seen.      The cardia and gastric fundus were normal on retroflexion.      The examined duodenum was normal. Impression:               -  Normal esophagus.                           - Erythematous mucosa in the gastric fundus and                            gastric body.                           - Normal examined duodenum.                           - Several biopsies were obtained on the greater                            curvature of the gastric body, on the lesser                            curvature of the gastric body, at the incisura, on                            the greater curvature of the gastric antrum and on                            the lesser curvature of the gastric antrum.                           It is not clear to what extent occult GI blood loss                            is contributing to anemia on this hemodialysis                            patient on [place] with normal iron studies. Moderate Sedation:      MAC sedation used Recommendation:           - Patient has a contact number available for                            emergencies. The signs and symptoms of potential                            delayed complications were discussed with the                            patient. Return to normal activities tomorrow.                            Written discharge  instructions were provided to the                            patient.                           - Resume previous diet.                           -  Resume Eliquis (apixaban) at prior dose tomorrow.                           - Await pathology results.                           - Return to primary care physician. Ongoing care                            with nephrologist both in clinic and dialysis for                            close monitoring of hemoglobin and hematocrit and                            iron studies for administration of IV iron and/or                            erythropoietin as needed.                           - To visualize the small bowel, perform video                            capsule endoscopy at appointment to be scheduled. Procedure Code(s):        --- Professional ---                           [phone], Esophagogastroduodenoscopy, flexible,                            transoral; with biopsy, single or multiple Diagnosis Code(s):        --- Professional ---                           K31.89, Other diseases of stomach and duodenum                           R19.5, Other fecal abnormalities CPT copyright 2022 American Medical Association. All rights reserved. The codes documented in this report are preliminary and upon coder review may  be revised to meet current compliance requirements. Simcha Speir L. Loletha Carrow, MD 12/15/2022 9:42:40 AM This report has been signed electronically. Number of Addenda: 0

## 2022-12-15 NOTE — Anesthesia Postprocedure Evaluation (Signed)
Anesthesia Post Note  Patient: Kenneth [name]  Procedure(s) Performed: COLONOSCOPY WITH PROPOFOL ESOPHAGOGASTRODUODENOSCOPY (EGD) WITH PROPOFOL BIOPSY POLYPECTOMY HEMOSTASIS CLIP PLACEMENT     Patient location during evaluation: Endoscopy Anesthesia Type: MAC Level of consciousness: awake and alert, patient cooperative and oriented Pain management: pain level controlled Vital Signs Assessment: post-procedure vital signs reviewed and stable Respiratory status: spontaneous breathing, nonlabored ventilation and respiratory function stable Cardiovascular status: stable and blood pressure returned to baseline Postop Assessment: no apparent nausea or vomiting and adequate PO intake Anesthetic complications: no   No notable events documented.  Last Vitals:  Vitals:   12/15/22 0736 12/15/22 0931  BP: (!) 195/62 (!) 124/35  Pulse:  (!) 47  Resp:  12  Temp:  (!) 36.3 C  SpO2:  100%    Last Pain:  Vitals:   12/15/22 0931  TempSrc: Tympanic  PainSc: 0-No pain                 Cyan Moultrie,E. Caeden Foots

## 2022-12-16 ENCOUNTER — Encounter: Payer: Self-pay | Admitting: Physical Therapy

## 2022-12-16 ENCOUNTER — Ambulatory Visit: Payer: 59 | Attending: Neurosurgery | Admitting: Physical Therapy

## 2022-12-16 ENCOUNTER — Other Ambulatory Visit (HOSPITAL_BASED_OUTPATIENT_CLINIC_OR_DEPARTMENT_OTHER): Payer: Self-pay

## 2022-12-16 DIAGNOSIS — M5416 Radiculopathy, lumbar region: Secondary | ICD-10-CM | POA: Insufficient documentation

## 2022-12-16 DIAGNOSIS — M6281 Muscle weakness (generalized): Secondary | ICD-10-CM | POA: Insufficient documentation

## 2022-12-16 DIAGNOSIS — R252 Cramp and spasm: Secondary | ICD-10-CM | POA: Insufficient documentation

## 2022-12-16 LAB — SURGICAL PATHOLOGY

## 2022-12-17 ENCOUNTER — Encounter (HOSPITAL_COMMUNITY): Payer: Self-pay | Admitting: Gastroenterology

## 2022-12-17 NOTE — Therapy (Signed)
OUTPATIENT PHYSICAL THERAPY TREATMENT   Patient Name: Kenneth [name] MRN: [id number] DOB:1956/06/10, 67 y.o., male Today's Date: 12/21/2022  END OF SESSION:  PT End of Session - 12/21/22 1348     Visit Number 4    Date for PT Re-Evaluation 02/01/23    Authorization Type U[employer] MCR    Progress Note Due on Visit 10    PT Start Time 1346    PT Stop Time 1433    PT Time Calculation (min) 47 min    Activity Tolerance Patient tolerated treatment well    Behavior During Therapy [facility] for tasks assessed/performed              Past Medical History:  Diagnosis Date   Arthritis    hands   CAD (coronary artery disease) 10/30/2016   NSTEMI 9/17 with CABG x 3 (LIMA-LAD, SVG-OM, SVG-RCA).   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   type 1 DM   Dyspnea    with exertion   ESRD on hemodialysis    "MWF; [place]" (03/30/2018)   Gastroesophageal reflux disease 08/18/2016   Hyperlipidemia    Hypertension    Ischemic rest pain of lower extremity 02/06/2015   Keratoma 02/27/2015   Metatarsal deformity 02/27/2015   PAF (paroxysmal atrial fibrillation)    Pronation deformity of both feet 02/27/2015   Past Surgical History:  Procedure Laterality Date   A/V FISTULAGRAM Left 03/06/2021   Procedure: A/V FISTULAGRAM;  Surgeon: Cephus Shelling, MD;  [facility]: [facility] INVASIVE CV LAB;  Service: Cardiovascular;  Laterality: Left;   A/V FISTULAGRAM Left 06/02/2022   Procedure: A/V Fistulagram;  Surgeon: Nada Libman, MD;  Location: [facility];  Service: Cardiovascular;  Laterality: Left;   AV FISTULA PLACEMENT Left 06/22/2016   Procedure: ARTERIOVENOUS (AV) FISTULA CREATION LEFT UPPER ARM;  Surgeon: Larina Earthly, MD;  Location[facility] OR;  Service: Vascular;  Laterality: Left;   BIOPSY  12/19/2020   Procedure: BIOPSY;  Surgeon: Meryl Dare, MD;  Location: [facility] ENDOSCOPY;   Service: Endoscopy;;   BIOPSY  12/15/2022   Procedure: BIOPSY;  Surgeon: Sherrilyn Rist, MD;  Location: [facility] ENDOSCOPY;  Service: Gastroenterology;;   CARDIAC CATHETERIZATION N/A 05/31/2016   Procedure: Left Heart Cath and Coronary Angiography;  Surgeon: Corky Crafts, MD;  Location: [facility] INVASIVE CV LAB;  Service: Cardiovascular;  Laterality: N/A;   CARDIAC CATHETERIZATION N/A 05/31/2016   Procedure: Right Heart Cath;  Surgeon: Corky Crafts, MD;  Location: [facility] INVASIVE CV LAB;  Service: Cardiovascular;  Laterality: N/A;   CARDIAC CATHETERIZATION N/A 05/31/2016   Procedure: IABP Insertion;  Surgeon: Corky Crafts, MD;  [facility]: [facility];  Service: Cardiovascular;  Laterality: N/A;   COLONOSCOPY WITH PROPOFOL N/A 12/20/2020   Procedure: COLONOSCOPY WITH PROPOFOL;  Surgeon: Napoleon Form, MD;  Location: [facility] ENDOSCOPY;  Service: Endoscopy;  Laterality: N/A;   COLONOSCOPY WITH PROPOFOL N/A 12/15/2022   Procedure: COLONOSCOPY WITH PROPOFOL;  Surgeon: Sherrilyn Rist, MD;  Location: [facility] ENDOSCOPY;  Service: Gastroenterology;  Laterality: N/A;   CORONARY ARTERY BYPASS GRAFT N/A 06/05/2016   Procedure: CORONARY ARTERY BYPASS GRAFTING (CABG) x3 LIMA to LAD -SVG to OM -SVG to RCA;  Surgeon: Kerin Perna, MD;  Location: [facility] OR;  Service: Open Heart Surgery;  Laterality: N/A;   CORONARY  STENT INTERVENTION N/A 07/07/2017   Procedure: CORONARY STENT INTERVENTION;  Surgeon: Iran Ouch, MD;  [facility];  Service: Cardiovascular;  Laterality: N/A;   CORONARY STENT INTERVENTION N/A 03/30/2018   Procedure: CORONARY STENT INTERVENTION;  Surgeon: Swaziland, Peter M, MD;  Location: [facility] INVASIVE CV LAB;  Service: Cardiovascular;  Laterality: N/A;   ESOPHAGOGASTRODUODENOSCOPY (EGD) WITH PROPOFOL N/A 12/19/2020   Procedure: ESOPHAGOGASTRODUODENOSCOPY (EGD) WITH PROPOFOL;  Surgeon: Meryl Dare, MD;  Location: [facility] ENDOSCOPY;  Service: Endoscopy;  Laterality: N/A;    ESOPHAGOGASTRODUODENOSCOPY (EGD) WITH PROPOFOL N/A 12/15/2022   Procedure: ESOPHAGOGASTRODUODENOSCOPY (EGD) WITH PROPOFOL;  Surgeon: Sherrilyn Rist, MD;  Location: [facility] ENDOSCOPY;  Service: Gastroenterology;  Laterality: N/A;   EXCHANGE OF A DIALYSIS CATHETER Right 04/16/2022   Procedure: EXCHANGE OF A DIALYSIS CATHETER;  Surgeon: Leonie Douglas, MD;  Location: [facility] OR;  Service: Vascular;  Laterality: Right;   GIVENS CAPSULE STUDY N/A 12/20/2020   Procedure: GIVENS CAPSULE STUDY;  Surgeon: Napoleon Form, MD;  Location: [facility] ENDOSCOPY;  Service: Endoscopy;  Laterality: N/A;   HEMOSTASIS CLIP PLACEMENT  12/15/2022   Procedure: HEMOSTASIS CLIP PLACEMENT;  Surgeon: Sherrilyn Rist, MD;  Location: [facility] ENDOSCOPY;  Service: Gastroenterology;;   INSERTION OF DIALYSIS CATHETER N/A 06/05/2016   Procedure: INSERTION OF DIALYSIS/trialysis CATHETER;  Surgeon: Kerin Perna, MD;  Location: [facility] OR;  Service: Vascular;  Laterality: N/A;   INSERTION OF DIALYSIS CATHETER Right 06/13/2016   Procedure: INSERTION OF DIALYSIS CATHETER RIGHT INTERNAL JUGULAR;  Surgeon: Fransisco Hertz, MD;  Location: [facility] OR;  Service: Vascular;  Laterality: Right;   INSERTION OF DIALYSIS CATHETER Right 09/07/2019   Procedure: Insertion Of Dialysis Catheter;  Surgeon: Larina Earthly, MD;  Location: [facility] OR;  Service: Vascular;  Laterality: Right;   INSERTION OF DIALYSIS CATHETER N/A 04/09/2022   Procedure: INSERTION OF TUNNELED DIALYSIS CATHETER;  Surgeon: Leonie Douglas, MD;  Location: [facility] OR;  Service: Vascular;  Laterality: N/A;   INTRAOPERATIVE TRANSESOPHAGEAL ECHOCARDIOGRAM N/A 06/05/2016   Procedure: INTRAOPERATIVE TRANSESOPHAGEAL ECHOCARDIOGRAM;  Surgeon: Kerin Perna, MD;  Location: [facility] OR;  Service: Open Heart Surgery;  Laterality: N/A;   LEFT HEART CATH AND CORS/GRAFTS ANGIOGRAPHY N/A 11/03/2016   Procedure: Left Heart Cath and Cors/Grafts Angiography;  Surgeon: Lennette Bihari, MD;  [facility];  Service:  Cardiovascular;  Laterality: N/A;   LEFT HEART CATH AND CORS/GRAFTS ANGIOGRAPHY N/A 07/07/2017   Procedure: LEFT HEART CATH AND CORS/GRAFTS ANGIOGRAPHY;  Surgeon: Laurey Morale, MD;  Location: [facility] INVASIVE CV LAB;  Service: Cardiovascular;  Laterality: N/A;   LEFT HEART CATH AND CORS/GRAFTS ANGIOGRAPHY N/A 03/30/2018   Procedure: LEFT HEART CATH AND CORS/GRAFTS ANGIOGRAPHY;  Surgeon: Swazil[place], Peter M, MD;  Location: [facility] INVASIVE CV LAB;  Service: Cardiovascular;  Laterality: N/A;   LEFT HEART CATH AND CORS/GRAFTS ANGIOGRAPHY N/A 04/17/2019   Procedure: LEFT HEART CATH AND CORS/GRAFTS ANGIOGRAPHY;  Surgeon: Laurey Morale, MD;  Location: [facility] INVASIVE CV LAB;  Service: Cardiovascular;  Laterality: N/A;   LUMBAR LAMINECTOMY/DECOMPRESSION MICRODISCECTOMY Bilateral 09/01/2022   Procedure: Laminectomy and Foraminotomy - bilateral - Lumbar three-Lumbar four;  Surgeon: Julio Sicks, MD;  Location: [facility] OR;  Service: Neurosurgery;  Laterality: Bilateral;   PERIPHERAL VASCULAR BALLOON ANGIOPLASTY Left 06/02/2022   Procedure: PERIPHERAL VASCULAR BALLOON ANGIOPLASTY;  Surgeon: Nada Libman, MD;  [facility];  Service: Cardiovascular;  Laterality: Left;   PERIPHERAL VASCULAR INTERVENTION Left 03/06/2021   Procedure: PERIPHERAL VASCULAR INTERVENTION;  Surgeon: Cephus Shellinglark, Christopher J, MD;  [facility];  Service: Cardiovascular;  Laterality: Left;   POLYPECTOMY  12/20/2020   Procedure: POLYPECTOMY;  Surgeon: Napoleon FormNandigam, Kavitha V, MD;  [facility]OS[facility];  Service: Endoscopy;;   POLYPECTOMY  12/15/2022   Procedure: POLYPECTOMY;  Surgeon: Sherrilyn Ristanis, Henry L III, MD;  [facility];  Service: Gastroenterology;;   REVISION OF ARTERIOVENOUS GORETEX GRAFT Left 07/27/2019   Procedure: PLICATION OF ANEURYSM OF ARTERIOVENOUS FISTULA  LEFT ARM;  Surgeon: Larina EarthlyEarly, Todd F, MD;  Location: [facility] OR;  Service: Vascular;  Laterality: Left;   REVISON OF ARTERIOVENOUS FISTULA Left 09/07/2019    Procedure: REVISON OF LEFT UPPER ARM ARTERIOVENOUS FISTULA;  Surgeon: Larina EarthlyEarly, Todd F, MD;  Location: [facility] OR;  Service: Vascular;  Laterality: Left;   REVISON OF ARTERIOVENOUS FISTULA Left 04/09/2022   Procedure: LEFT ARM FISTULA REVISION;  Surgeon: Leonie DouglasHawken, Thomas N, MD;  Location: [facility] OR;  Service: Vascular;  Laterality: Left;  PERIPHERAL NERVE BLOCK   REVISON OF ARTERIOVENOUS FISTULA Left 04/16/2022   Procedure: REVISON OF ARTERIOVENOUS FISTULA;  Surgeon: Leonie DouglasHawken, Thomas N, MD;  [facility];  Service: Vascular;  Laterality: Left;   RIGHT/LEFT HEART CATH AND CORONARY ANGIOGRAPHY N/A 01/03/2021   Procedure: RIGHT/LEFT HEART CATH AND CORONARY ANGIOGRAPHY;  Surgeon: Laurey MoraleMcLean, Dalton S, MD;  [facility];  Service: Cardiovascular;  Laterality: N/A;   Patient Active Problem List   Diagnosis Date Noted   Gastric intestinal metaplasia 12/15/2022   Benign neoplasm of ascending colon 12/15/2022   Benign neoplasm of ileocecal valve 12/15/2022   Porokeratosis 11/10/2022   Depression, recurrent 10/06/2022   Lumbar stenosis with neurogenic claudication 09/01/2022   Chronic arthritis 08/11/2022   Tinea pedis 06/09/2022   Orthostatic hypotension 04/15/2022   Elevated troponin 04/15/2022   Complication associated with dialysis catheter 04/15/2022   Complication of AV dialysis fistula 04/15/2022   ESRD (end stage renal disease) 04/09/2022   Pulmonary emphysema 02/21/2022   Acute on chronic anemia 02/21/2022   Type 2 diabetes mellitus with hyperglycemia, with long-term current use of insulin 10/28/2021   Type 2 diabetes mellitus with diabetic polyneuropathy, with long-term current use of insulin 10/28/2021   Hypertension associated with diabetes 09/16/2021   Chronic bilateral low back pain with bilateral sciatica 09/16/2021   Partial seizure disorder    Acute respiratory failure with hypoxia 07/28/2021   Prolonged QT interval 01/01/2021   Hemoptysis 01/01/2021   Positive fecal occult blood  test    Symptomatic bradycardia 12/15/2020   Postural dizziness with presyncope 12/15/2020   Blood in stool 12/15/2020   Acute metabolic encephalopathy 08/29/2019   Altered mental status 08/27/2019   Unresponsive episode 08/27/2019   AF (paroxysmal atrial fibrillation) 08/27/2019   Status post coronary artery stent placement    NSTEMI (non-ST elevated myocardial infarction) 03/30/2018   Hypertensive heart disease 09/12/2017   Hyperlipidemia associated with type 2 diabetes mellitus 09/12/2017   Hyperkalemia 07/20/2017   Chronic diastolic CHF (congestive heart failure) 04/14/2017   Hypertensive emergency 04/14/2017   Anemia, chronic renal failure 04/11/2017   Fluid overload 04/11/2017   Diabetic foot ulcer 04/11/2017   Cellulitis in diabetic foot 01/18/2017   Chest pain 12/16/2016   Hypertensive urgency 12/16/2016   Pressure injury of skin 11/03/2016   Type 2 diabetes mellitus with chronic kidney disease on chronic dialysis, with long-term current use of insulin 10/31/2016   ESRD (end stage renal disease) on dialysis 10/30/2016   Atherosclerotic heart disease of native coronary artery with  other forms of angina pectoris with elevated troponin     Gastroesophageal reflux disease 08/18/2016   HCAP (healthcare-associated pneumonia)    Non-ST elevation (NSTEMI) myocardial infarction    Pronation deformity of both feet 02/27/2015   Metatarsal deformity 02/27/2015   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   Ischemic rest pain of lower extremity (HCC) 02/06/2015    PCP: Sharlene Dory, DO   REFERRING PROVIDER: Julio Sicks, MD   REFERRING DIAG: [phone] (ICD-10-CM) - Spinal stenosis, lumbar region with neurogenic claudication   Rationale for Evaluation and Treatment: Rehabilitation  THERAPY DIAG:  Radiculopathy, lumbar region  Cramp and spasm  Muscle weakness (generalized)  ONSET DATE: 3 weeks  SUBJECTIVE:                                                                                                                                                                                            SUBJECTIVE STATEMENT: Patient reports he had some relief with DN, but only for a day or two.  [place] Interpreter: Brantley Stage  PERTINENT HISTORY:  T2DM, CKD on dialysis, CHF, L3/4 discectomy  PAIN:  Are you having pain? Yes: NPRS scale: 7/10 Pain location: low back and bil LE to feet Pain description: pain in back, electricity in legs Aggravating factors: everything Relieving factors: nothing  PRECAUTIONS: None  WEIGHT BEARING RESTRICTIONS: No  FALLS:  Has patient fallen in last 6 months? No  LIVING ENVIRONMENT: Lives with: lives with their family Lives in: House/apartment Stairs: Yes: Internal: 13 steps; on left going up and External: 5 steps; on left going up Has following equipment at home: Single point cane, Walker - 2 wheeled, and Environmental consultant - 4 wheeled  OCCUPATION: retired  PLOF: Independent with household mobility with device  PATIENT GOALS: get rid of pain  NEXT MD VISIT: 2 weeks now  OBJECTIVE:   DIAGNOSTIC FINDINGS:  MRI 10/26/22 IMPRESSION: 1. Interval posterior decompression and discectomy at L3-4. There is progressive loss of disc height with probable intradiscal vacuum phenomenon and a broad-based central and right-sided disc protrusion which appears slightly increased from the preoperative study. There is progressive mass effect on the thecal sac which is incompletely decompressed posteriorly. Right foraminal narrowing appears slightly worse with probable right L3 nerve root encroachment. 2. No other significant changes are identified. Multilevel spondylosis superimposed on congenitally short pedicles with resulting chronic multifactorial spinal stenosis from L1-2 through L4-5. 3. Reactive endplate edema at G4-0 without evidence of discitis/osteomyelitis.  PATIENT SURVEYS:  Modified Oswestry  40 / 50 = 80.0 %    SCREENING FOR RED FLAGS: Bowel or bladder incontinence: No Spinal tumors: No Cauda equina  syndrome: No Compression fracture: No Abdominal aneurysm: No  COGNITION: Overall cognitive status: Within functional limits for tasks assessed     SENSATION: WFL  MUSCLE LENGTH: HS: tight bil L> R Piriformis: bil L> R Hip Flexors: NT, but likely tight due to posture in standing Heelcords: WFL   POSTURE: rounded shoulders, forward head, decreased lumbar lordosis, increased thoracic kyphosis, and posterior pelvic tilt  PALPATION: Palpation: TTP at Bil gluteals L >R, bil lumbar.  Spinal mobility NT due to patient's pain level.  LUMBAR ROM:   AROM eval  Flexion Hands to knees  Extension Unable to get to neutral - bends knees to extend backward  Right lateral flexion 80% limited  Left lateral flexion 90% limited  Right rotation 75% limited  Left rotation 80% limited   (Blank rows = not tested)  LOWER EXTREMITY ROM:   WFL for MMT, hip extension not tested but decreased based on patient's inability to stand in neutral.  LOWER EXTREMITY MMT:    MMT Right eval Left eval  Hip flexion 4 4-  Hip extension 4-* 4-*  Hip abduction 5 4+  Hip adduction 4 4-  Hip internal rotation    Hip external rotation    Knee flexion 5 4+  Knee extension 4+ 4  Ankle dorsiflexion 5 5   (Blank rows = not tested)   FUNCTIONAL TESTS:  30 seconds chair stand test: 12/09/22: score is 0 - able to do 3x w/o hands but used hands 1x  GAIT: Distance walked: 40 Assistive device utilized: Environmental consultant - 4 wheeled Level of assistance: Complete Independence Comments: flexed trunk with walker too far ahead of him  TODAY'S TREATMENT:                                                                                                                              DATE:  12/21/22 Therapeutic Exercise: to improve flexibility, strength and mobility.  Verbal and tactile cues throughout for technique Nustep L4 x 6  min Seated hip flexor stretch 3x30 sec bil Standing: hipABD and ext x 10 ea B (ext painful), marching x 6 ea Supine with lower legs on peanut: glute/HS sets 5 sec hold x 10 Hooklying ab set pushing peanut into thighs 5 sec hold x 10 S/L hip ABD x 10 B S/L clam x 10 B Supine clam with YTB x 10, L only x 10 Sit to stand x 5 hands to hands on knees to no hands   12/16/22 Therapeutic Exercise: to improve flexibility, strength and mobility.  Verbal and tactile cues throughout for technique Nustep L4 x 6 min Seated piriformis stretch x 30 sec BLE Seated trunk rotations x 10 bil Seated diagonals with ball x 5 ea way Seated trunk extension with shoulder flexion x 10 Seated horiz ABD with RTB x 10 Seated PPT x 10  Manual Therapy: to decrease muscle spasm and pain and improve mobility Skilled palpation and monitoring of soft tissues during DN  STM to bil gluteals and piriformis post DN Trigger Point Dry-Needling  Treatment instructions: Expect mild to moderate muscle soreness. S/S of pneumothorax if dry needled over a lung field, and to seek immediate medical attention should they occur. Patient verbalized understanding of these instructions and education. Patient Consent Given: Yes Education handout provided: Yes Muscles treated: bil gluteals and piriformis Electrical stimulation performed: No Parameters: N/A Treatment response/outcome: Twitch Response Elicited and Palpable Increase in Muscle Length  Modalities: MHP to bil gluteals and LB in sitting x 10 min post MT   12/09/22 Therapeutic Exercise: Nustep L4x94min Seated hamstring stretch with chair x 30 sec BLE Seated piriformis stretch x 30 sec BLE Seated trunk rotations x 10 bil Standing trunk extension at wall attempted but pain Seated posterior pelvic tilts 10x5" Seated trunk extension with shoulder flexion x 10 Seated horiz ABD with extension x 10  Gait Training: 90 ft no AD- flexed trunk fatigue after 70' : 90 ft with rollator  better trunk extension and stability  12/07/22 See pt ed  PATIENT EDUCATION:  Education details: HEP update  Person educated: Patient Education method: Explanation, Demonstration, Tactile cues, Verbal cues, and Handouts Education comprehension: verbalized understanding, returned demonstration, verbal cues required, and needs further education  HOME EXERCISE PROGRAM: Access Code: [email or web address] URL: [email or web address] Date: 12/21/2022 Prepared by: Kenneth [name]  Exercises - Seated Hamstring Stretch with Chair  - 2 x daily - 7 x weekly - 1 sets - 3 reps - 20-30 sec hold - Seated Figure 4 Piriformis Stretch  - 2 x daily - 7 x weekly - 1 sets - 3 reps - 30-60 sec hold - Seated Trunk Rotation  - 1 x daily - 7 x weekly - 1 sets - 10 reps - 10 sec hold - Seated Thoracic Extension Arms Overhead  - 1 x daily - 7 x weekly - 2 sets - 10 reps - Seated Pelvic Tilt  - 1 x daily - 7 x weekly - 2 sets - 10 reps - Seated Diagonal Chops with Medicine Ball  - 1 x daily - 7 x weekly - 2 sets - 10 reps - Seated Shoulder Horizontal Abduction with Resistance - Palms Down  - 1 x daily - 7 x weekly - 2 sets - 10 reps - Seated Hip Flexor Stretch  - 2 x daily - 7 x weekly - 1 sets - 3 reps - 30-60 sec hold - Abdominal Press into Ball  - 1 x daily - 7 x weekly - 2 sets - 10 reps - 5 sec hold - Clamshell  - 1 x daily - 7 x weekly - 1-3 sets - 10 reps  ASSESSMENT:  CLINICAL IMPRESSION: Kenneth [name] reported some relief with DN, but it only lasted a day or two. He was able to tolerate more exercise today and even did some isometrics in supine today. Sidelying clams were very challenging and S/L hip ABD was more comfortable than standing. Standing hip extension was very painful.  Despite that he could tolerate bil hip flexor stretching in sitting. Kenneth [name] may benefit from a trial of TENs. He reports that he tried a Soil scientist and it felt good, but the adhesive tore his skin. (Test the electrode adhesive on patient's skin  first).   OBJECTIVE IMPAIRMENTS: decreased activity tolerance, difficulty walking, decreased ROM, decreased strength, increased muscle spasms, impaired flexibility, postural dysfunction, and pain.   ACTIVITY LIMITATIONS: carrying, lifting, bending, sitting, standing, squatting, sleeping, stairs, transfers, bed mobility, hygiene/grooming, and locomotion level  PARTICIPATION LIMITATIONS:  all ADLs limited  PERSONAL FACTORS: Time since onset of injury/illness/exacerbation and 3+ comorbidities: T2DM, CKD on dialysis, CHF, L3/4 discectomy  are also affecting patient's functional outcome.   REHAB POTENTIAL: Good  CLINICAL DECISION MAKING: Evolving/moderate complexity  EVALUATION COMPLEXITY: Moderate   GOALS: Goals reviewed with patient? Yes  SHORT TERM GOALS: Target date: 01/04/2023   Patient will be independent with initial HEP.  Baseline:  Goal status: IN PROGRESS  2.  Patient will report decreased pain by 25%.  Baseline:  Goal status: IN PROGRESS  LONG TERM GOALS: Target date: 02/01/2023    Patient will be independent with advanced/ongoing HEP to improve outcomes and carryover.  Baseline:  Goal status: IN PROGRESS  2.  Patient will report 50-75% improvement in low back and leg pain to improve QOL.  Baseline:  Goal status: IN PROGRESS  3.  Patient will demonstrate functional lumbar ROM to perform ADLs.   Baseline:  Goal status: IN PROGRESS  4.  Patient will demonstrate improved functional strength by increased 30 second chair stand test by 5. Baseline: 0 Goal status: IN PROGRESS  5.  Patient will report 34 on modified oswestry to demonstrate improved functional ability.  Baseline:  40 / 50 = 80.0 % Goal status: IN PROGRESS   6.  Patient will report ability to sleep a minimum of 5 hours without waking from pain. Baseline:  Goal status: IN PROGRESS     PLAN:  PT FREQUENCY: 2x/week  PT DURATION: 8 weeks  PLANNED INTERVENTIONS: Therapeutic exercises,  Therapeutic activity, Neuromuscular re-education, Balance training, Gait training, Patient/Family education, Self Care, Joint mobilization, Aquatic Therapy, Dry Needling, Electrical stimulation, Spinal mobilization, Cryotherapy, Moist heat, scar mobilization, Taping, Ultrasound, Ionotophoresis 4mg /ml Dexamethasone, and Manual therapy.  PLAN FOR NEXT SESSION: Trial of TENS (test electrode adhesive first) work on lumbar mobility, LE flexibility and strength.  modalities for pain.     Carolynne Schuchard, PT 12/21/2022, 2:57 PM

## 2022-12-18 ENCOUNTER — Encounter: Payer: Self-pay | Admitting: Gastroenterology

## 2022-12-21 ENCOUNTER — Ambulatory Visit: Payer: 59 | Admitting: Physical Therapy

## 2022-12-21 ENCOUNTER — Encounter: Payer: Self-pay | Admitting: Physical Therapy

## 2022-12-21 ENCOUNTER — Other Ambulatory Visit: Payer: Self-pay

## 2022-12-21 DIAGNOSIS — M6281 Muscle weakness (generalized): Secondary | ICD-10-CM

## 2022-12-21 DIAGNOSIS — D631 Anemia in chronic kidney disease: Secondary | ICD-10-CM

## 2022-12-21 DIAGNOSIS — M5416 Radiculopathy, lumbar region: Secondary | ICD-10-CM

## 2022-12-21 DIAGNOSIS — R195 Other fecal abnormalities: Secondary | ICD-10-CM

## 2022-12-21 DIAGNOSIS — R252 Cramp and spasm: Secondary | ICD-10-CM

## 2022-12-22 ENCOUNTER — Encounter: Payer: Self-pay | Admitting: Gastroenterology

## 2022-12-23 ENCOUNTER — Ambulatory Visit: Payer: 59

## 2022-12-23 DIAGNOSIS — M6281 Muscle weakness (generalized): Secondary | ICD-10-CM

## 2022-12-23 DIAGNOSIS — M5416 Radiculopathy, lumbar region: Secondary | ICD-10-CM | POA: Diagnosis not present

## 2022-12-23 DIAGNOSIS — R252 Cramp and spasm: Secondary | ICD-10-CM

## 2022-12-23 NOTE — Therapy (Signed)
OUTPATIENT PHYSICAL THERAPY TREATMENT   Patient Name: Kenneth [name] MRN: [id number] DOB:01/23/1956, 67 y.o., male Today's Date: 12/23/2022  END OF SESSION:  PT End of Session - 12/23/22 1322     Visit Number 5    Date for PT Re-Evaluation 02/01/23    Authorization Type [employer] MCR    Progress Note Due on Visit 10    PT Start Time 1315    PT Stop Time 1358    PT Time Calculation (min) 43 min    Activity Tolerance Patient tolerated treatment well    Behavior During Therapy WFL for tasks assessed/performed               Past Medical History:  Diagnosis Date   Arthritis    hands   CAD (coronary artery disease) 10/30/2016   NSTEMI 9/17 with CABG x 3 (LIMA-LAD, SVG-OM, SVG-RCA).   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   type 1 DM   Dyspnea    with exertion   ESRD on hemodialysis    "MWF; [place]" (03/30/2018)   Gastroesophageal reflux disease 08/18/2016   Hyperlipidemia    Hypertension    Ischemic rest pain of lower extremity 02/06/2015   Keratoma 02/27/2015   Metatarsal deformity 02/27/2015   PAF (paroxysmal atrial fibrillation)    Pronation deformity of both feet 02/27/2015   Past Surgical History:  Procedure Laterality Date   A/V FISTULAGRAM Left 03/06/2021   Procedure: A/V FISTULAGRAM;  Surgeon: Cephus Shelling, MD;  Location: [facility];  Service: Cardiovascular;  Laterality: Left;   A/V FISTULAGRAM Left 06/02/2022   Procedure: A/V Fistulagram;  Surgeon: Nada Libman, MD;  Location: [facility];  Service: Cardiovascular;  Laterality: Left;   AV FISTULA PLACEMENT Left 06/22/2016   Procedure: ARTERIOVENOUS (AV) FISTULA CREATION LEFT UPPER ARM;  Surgeon: Larina Earthly, MD;  Location: [facility] OR;  Service: Vascular;  Laterality: Left;   BIOPSY  12/19/2020   Procedure: BIOPSY;  Surgeon: Meryl Dare, MD;  Location: [facility]  ENDOSCOPY;  Service: Endoscopy;;   BIOPSY  12/15/2022   Procedure: BIOPSY;  Surgeon: Sherrilyn Rist, MD;  Location: [facility] ENDOSCOPY;  Service: Gastroenterology;;   CARDIAC CATHETERIZATION N/A 05/31/2016   Procedure: Left Heart Cath and Coronary Angiography;  Surgeon: Corky Crafts, MD;  Location: [facility] INVASIVE CV LAB;  Service: Cardiovascular;  Laterality: N/A;   CARDIAC CATHETERIZATION N/A 05/31/2016   Procedure: Right Heart Cath;  Surgeon: Corky Crafts, MD;  Location: [facility] INVASIVE CV LAB;  Service: Cardiovascular;  Laterality: N/A;   CARDIAC CATHETERIZATION N/A 05/31/2016   Procedure: IABP Insertion;  Surgeon: Corky Crafts, MD;  [facility]: [facility];  Service: Cardiovascular;  Laterality: N/A;   COLONOSCOPY WITH PROPOFOL N/A 12/20/2020   Procedure: COLONOSCOPY WITH PROPOFOL;  Surgeon: Napoleon Form, MD;  Location: [facility] ENDOSCOPY;  Service: Endoscopy;  Laterality: N/A;   COLONOSCOPY WITH PROPOFOL N/A 12/15/2022   Procedure: COLONOSCOPY WITH PROPOFOL;  Surgeon: Sherrilyn Rist, MD;  Location: [facility] ENDOSCOPY;  Service: Gastroenterology;  Laterality: N/A;   CORONARY ARTERY BYPASS GRAFT N/A 06/05/2016   Procedure: CORONARY ARTERY BYPASS GRAFTING (CABG) x3 LIMA to LAD -SVG to OM -SVG to RCA;  Surgeon: Kerin Perna, MD;  Location: [facility] OR;  Service: Open Heart Surgery;  Laterality: N/A;  CORONARY STENT INTERVENTION N/A 07/07/2017   Procedure: CORONARY STENT INTERVENTION;  Surgeon: Iran Ouch, MD;  [facility]: [facility];  Service: Cardiovascular;  Laterality: N/A;   CORONARY STENT INTERVENTION N/A 03/30/2018   Procedure: CORONARY STENT INTERVENTION;  Surgeon: Swaziland, Peter M, MD;  Location: [facility] INVASIVE CV LAB;  Service: Cardiovascular;  Laterality: N/A;   ESOPHAGOGASTRODUODENOSCOPY (EGD) WITH PROPOFOL N/A 12/19/2020   Procedure: ESOPHAGOGASTRODUODENOSCOPY (EGD) WITH PROPOFOL;  Surgeon: Meryl Dare, MD;  Location: [facility] ENDOSCOPY;  Service: Endoscopy;  Laterality:  N/A;   ESOPHAGOGASTRODUODENOSCOPY (EGD) WITH PROPOFOL N/A 12/15/2022   Procedure: ESOPHAGOGASTRODUODENOSCOPY (EGD) WITH PROPOFOL;  Surgeon: Sherrilyn Rist, MD;  Location: [facility] ENDOSCOPY;  Service: Gastroenterology;  Laterality: N/A;   EXCHANGE OF A DIALYSIS CATHETER Right 04/16/2022   Procedure: EXCHANGE OF A DIALYSIS CATHETER;  Surgeon: Leonie Douglas, MD;  Location: [facility] OR;  Service: Vascular;  Laterality: Right;   GIVENS CAPSULE STUDY N/A 12/20/2020   Procedure: GIVENS CAPSULE STUDY;  Surgeon: Napoleon Form, MD;  Location: [facility] ENDOSCOPY;  Service: Endoscopy;  Laterality: N/A;   HEMOSTASIS CLIP PLACEMENT  12/15/2022   Procedure: HEMOSTASIS CLIP PLACEMENT;  Surgeon: Sherrilyn Rist, MD;  Location: [facility] ENDOSCOPY;  Service: Gastroenterology;;   INSERTION OF DIALYSIS CATHETER N/A 06/05/2016   Procedure: INSERTION OF DIALYSIS/trialysis CATHETER;  Surgeon: Kerin Perna, MD;  Location: [facility] OR;  Service: Vascular;  Laterality: N/A;   INSERTION OF DIALYSIS CATHETER Right 06/13/2016   Procedure: INSERTION OF DIALYSIS CATHETER RIGHT INTERNAL JUGULAR;  Surgeon: Fransisco Hertz, MD;  Location: [facility] OR;  Service: Vascular;  Laterality: Right;   INSERTION OF DIALYSIS CATHETER Right 09/07/2019   Procedure: Insertion Of Dialysis Catheter;  Surgeon: Larina Earthly, MD;  Location: [facility] OR;  Service: Vascular;  Laterality: Right;   INSERTION OF DIALYSIS CATHETER N/A 04/09/2022   Procedure: INSERTION OF TUNNELED DIALYSIS CATHETER;  Surgeon: Leonie Douglas, MD;  Location: [facility] OR;  Service: Vascular;  Laterality: N/A;   INTRAOPERATIVE TRANSESOPHAGEAL ECHOCARDIOGRAM N/A 06/05/2016   Procedure: INTRAOPERATIVE TRANSESOPHAGEAL ECHOCARDIOGRAM;  Surgeon: Kerin Perna, MD;  Location: [facility] OR;  Service: Open Heart Surgery;  Laterality: N/A;   LEFT HEART CATH AND CORS/GRAFTS ANGIOGRAPHY N/A 11/03/2016   Procedure: Left Heart Cath and Cors/Grafts Angiography;  Surgeon: Lennette Bihari, MD;  Location: [facility];  Service:  Cardiovascular;  Laterality: N/A;   LEFT HEART CATH AND CORS/GRAFTS ANGIOGRAPHY N/A 07/07/2017   Procedure: LEFT HEART CATH AND CORS/GRAFTS ANGIOGRAPHY;  Surgeon: Laurey Morale, MD;  Location: [facility] INVASIVE CV LAB;  Service: Cardiovascular;  Laterality: N/A;   LEFT HEART CATH AND CORS/GRAFTS ANGIOGRAPHY N/A 03/30/2018   Procedure: LEFT HEART CATH AND CORS/GRAFTS ANGIOGRAPHY;  Surgeon: Swazil[place], Peter M, MD;  Location: [facility] INVASIVE CV LAB;  Service: Cardiovascular;  Laterality: N/A;   LEFT HEART CATH AND CORS/GRAFTS ANGIOGRAPHY N/A 04/17/2019   Procedure: LEFT HEART CATH AND CORS/GRAFTS ANGIOGRAPHY;  Surgeon: Laurey Morale, MD;  Location: [facility] INVASIVE CV LAB;  Service: Cardiovascular;  Laterality: N/A;   LUMBAR LAMINECTOMY/DECOMPRESSION MICRODISCECTOMY Bilateral 09/01/2022   Procedure: Laminectomy and Foraminotomy - bilateral - Lumbar three-Lumbar four;  Surgeon: Julio Sicks, MD;  Location: [facility] OR;  Service: Neurosurgery;  Laterality: Bilateral;   PERIPHERAL VASCULAR BALLOON ANGIOPLASTY Left 06/02/2022   Procedure: PERIPHERAL VASCULAR BALLOON ANGIOPLASTY;  Surgeon: Nada Libman, MD;  Location: [facility];  Service: Cardiovascular;  Laterality: Left;   PERIPHERAL VASCULAR INTERVENTION Left 03/06/2021   Procedure: PERIPHERAL VASCULAR  INTERVENTION;  Surgeon: Cephus Shelling, MD;  Location: [facility] INVASIVE CV LAB;  Service: Cardiovascular;  Laterality: Left;   POLYPECTOMY  12/20/2020   Procedure: POLYPECTOMY;  Surgeon: Napoleon Form, MD;  Location: [facility] ENDOSCOPY;  Service: Endoscopy;;   POLYPECTOMY  12/15/2022   Procedure: POLYPECTOMY;  Surgeon: Sherrilyn Rist, MD;  Location: [facility] ENDOSCOPY;  Service: Gastroenterology;;   REVISION OF ARTERIOVENOUS GORETEX GRAFT Left 07/27/2019   Procedure: PLICATION OF ANEURYSM OF ARTERIOVENOUS FISTULA  LEFT ARM;  Surgeon: Larina Earthly, MD;  Location: [facility] OR;  Service: Vascular;  Laterality: Left;   REVISON OF ARTERIOVENOUS FISTULA Left 09/07/2019    Procedure: REVISON OF LEFT UPPER ARM ARTERIOVENOUS FISTULA;  Surgeon: Larina Earthly, MD;  Location: [facility] OR;  Service: Vascular;  Laterality: Left;   REVISON OF ARTERIOVENOUS FISTULA Left 04/09/2022   Procedure: LEFT ARM FISTULA REVISION;  Surgeon: Leonie Douglas, MD;  Location: [facility] OR;  Service: Vascular;  Laterality: Left;  PERIPHERAL NERVE BLOCK   REVISON OF ARTERIOVENOUS FISTULA Left 04/16/2022   Procedure: REVISON OF ARTERIOVENOUS FISTULA;  Surgeon: Leonie Douglas, MD;  Location: [facility] OR;  Service: Vascular;  Laterality: Left;   RIGHT/LEFT HEART CATH AND CORONARY ANGIOGRAPHY N/A 01/03/2021   Procedure: RIGHT/LEFT HEART CATH AND CORONARY ANGIOGRAPHY;  Surgeon: Laurey Morale, MD;  Location: [facility] INVASIVE CV LAB;  Service: Cardiovascular;  Laterality: N/A;   Patient Active Problem List   Diagnosis Date Noted   Gastric intestinal metaplasia 12/15/2022   Benign neoplasm of ascending colon 12/15/2022   Benign neoplasm of ileocecal valve 12/15/2022   Porokeratosis 11/10/2022   Depression, recurrent 10/06/2022   Lumbar stenosis with neurogenic claudication 09/01/2022   Chronic arthritis 08/11/2022   Tinea pedis 06/09/2022   Orthostatic hypotension 04/15/2022   Elevated troponin 04/15/2022   Complication associated with dialysis catheter 04/15/2022   Complication of AV dialysis fistula 04/15/2022   ESRD (end stage renal disease) 04/09/2022   Pulmonary emphysema 02/21/2022   Acute on chronic anemia 02/21/2022   Type 2 diabetes mellitus with hyperglycemia, with long-term current use of insulin 10/28/2021   Type 2 diabetes mellitus with diabetic polyneuropathy, with long-term current use of insulin 10/28/2021   Hypertension associated with diabetes 09/16/2021   Chronic bilateral low back pain with bilateral sciatica 09/16/2021   Partial seizure disorder    Acute respiratory failure with hypoxia 07/28/2021   Prolonged QT interval 01/01/2021   Hemoptysis 01/01/2021   Positive fecal occult blood  test    Symptomatic bradycardia 12/15/2020   Postural dizziness with presyncope 12/15/2020   Blood in stool 12/15/2020   Acute metabolic encephalopathy 08/29/2019   Altered mental status 08/27/2019   Unresponsive episode 08/27/2019   AF (paroxysmal atrial fibrillation) 08/27/2019   Status post coronary artery stent placement    NSTEMI (non-ST elevated myocardial infarction) 03/30/2018   Hypertensive heart disease 09/12/2017   Hyperlipidemia associated with type 2 diabetes mellitus 09/12/2017   Hyperkalemia 07/20/2017   Chronic diastolic CHF (congestive heart failure) 04/14/2017   Hypertensive emergency 04/14/2017   Anemia, chronic renal failure 04/11/2017   Fluid overload 04/11/2017   Diabetic foot ulcer 04/11/2017   Cellulitis in diabetic foot 01/18/2017   Chest pain 12/16/2016   Hypertensive urgency 12/16/2016   Pressure injury of skin 11/03/2016   Type 2 diabetes mellitus with chronic kidney disease on chronic dialysis, with long-term current use of insulin 10/31/2016   ESRD (end stage renal disease) on dialysis 10/30/2016   Atherosclerotic heart disease of native coronary  artery with other forms of angina pectoris with elevated troponin     Gastroesophageal reflux disease 08/18/2016   HCAP (healthcare-associated pneumonia)    Non-ST elevation (NSTEMI) myocardial infarction    Pronation deformity of both feet 02/27/2015   Metatarsal deformity 02/27/2015   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   Ischemic rest pain of lower extremity (HCC) 02/06/2015    PCP: Sharlene Dory, DO   REFERRING PROVIDER: Julio Sicks, MD   REFERRING DIAG: [phone] (ICD-10-CM) - Spinal stenosis, lumbar region with neurogenic claudication   Rationale for Evaluation and Treatment: Rehabilitation  THERAPY DIAG:  Radiculopathy, lumbar region  Cramp and spasm  Muscle weakness (generalized)  ONSET DATE: 3 weeks  SUBJECTIVE:                                                                                                                                                                                            SUBJECTIVE STATEMENT: Pt reports improved pain today in low back.  [place] Interpreter: Brantley Stage  PERTINENT HISTORY:  T2DM, CKD on dialysis, CHF, L3/4 discectomy  PAIN:  Are you having pain? Yes: NPRS scale: 6/10 Pain location: low back and bil LE to feet Pain description: pain in back, electricity in legs Aggravating factors: everything Relieving factors: nothing  PRECAUTIONS: None  WEIGHT BEARING RESTRICTIONS: No  FALLS:  Has patient fallen in last 6 months? No  LIVING ENVIRONMENT: Lives with: lives with their family Lives in: House/apartment Stairs: Yes: Internal: 13 steps; on left going up and External: 5 steps; on left going up Has following equipment at home: Single point cane, Walker - 2 wheeled, and Environmental consultant - 4 wheeled  OCCUPATION: retired  PLOF: Independent with household mobility with device  PATIENT GOALS: get rid of pain  NEXT MD VISIT: 2 weeks now  OBJECTIVE:   DIAGNOSTIC FINDINGS:  MRI 10/26/22 IMPRESSION: 1. Interval posterior decompression and discectomy at L3-4. There is progressive loss of disc height with probable intradiscal vacuum phenomenon and a broad-based central and right-sided disc protrusion which appears slightly increased from the preoperative study. There is progressive mass effect on the thecal sac which is incompletely decompressed posteriorly. Right foraminal narrowing appears slightly worse with probable right L3 nerve root encroachment. 2. No other significant changes are identified. Multilevel spondylosis superimposed on congenitally short pedicles with resulting chronic multifactorial spinal stenosis from L1-2 through L4-5. 3. Reactive endplate edema at E4-5 without evidence of discitis/osteomyelitis.  PATIENT SURVEYS:  Modified Oswestry  40 / 50 = 80.0 %   SCREENING FOR RED  FLAGS: Bowel or bladder incontinence: No Spinal tumors: No Cauda equina syndrome: No Compression fracture: No  Abdominal aneurysm: No  COGNITION: Overall cognitive status: Within functional limits for tasks assessed     SENSATION: WFL  MUSCLE LENGTH: HS: tight bil L> R Piriformis: bil L> R Hip Flexors: NT, but likely tight due to posture in standing Heelcords: WFL   POSTURE: rounded shoulders, forward head, decreased lumbar lordosis, increased thoracic kyphosis, and posterior pelvic tilt  PALPATION: Palpation: TTP at Bil gluteals L >R, bil lumbar.  Spinal mobility NT due to patient's pain level.  LUMBAR ROM:   AROM eval  Flexion Hands to knees  Extension Unable to get to neutral - bends knees to extend backward  Right lateral flexion 80% limited  Left lateral flexion 90% limited  Right rotation 75% limited  Left rotation 80% limited   (Blank rows = not tested)  LOWER EXTREMITY ROM:   WFL for MMT, hip extension not tested but decreased based on patient's inability to stand in neutral.  LOWER EXTREMITY MMT:    MMT Right eval Left eval  Hip flexion 4 4-  Hip extension 4-* 4-*  Hip abduction 5 4+  Hip adduction 4 4-  Hip internal rotation    Hip external rotation    Knee flexion 5 4+  Knee extension 4+ 4  Ankle dorsiflexion 5 5   (Blank rows = not tested)   FUNCTIONAL TESTS:  30 seconds chair stand test: 12/09/22: score is 0 - able to do 3x w/o hands but used hands 1x  GAIT: Distance walked: 40 Assistive device utilized: Environmental consultantWalker - 4 wheeled Level of assistance: Complete Independence Comments: flexed trunk with walker too far ahead of him  TODAY'S TREATMENT:                                                                                                                              DATE:  12/23/22 Therapeutic Exercise: to improve flexibility, strength and mobility.  Verbal and tactile cues throughout for technique Nustep L5 x 6 min Standing hip abduction  2x10 bil Standing HS curls x 10 bil Standing marching x 10 bil Seated LAQ 2# 2x10 bil Seated march 2# 2x10 bil Lat pulls 20# x 10  Rows 15# 2x10  STM to bil lumbr paraspinals in sitting  12/21/22 Therapeutic Exercise: to improve flexibility, strength and mobility.  Verbal and tactile cues throughout for technique Nustep L4 x 6 min Seated hip flexor stretch 3x30 sec bil Standing: hipABD and ext x 10 ea B (ext painful), marching x 6 ea Supine with lower legs on peanut: glute/HS sets 5 sec hold x 10 Hooklying ab set pushing peanut into thighs 5 sec hold x 10 S/L hip ABD x 10 B S/L clam x 10 B Supine clam with YTB x 10, L only x 10 Sit to stand x 5 hands to hands on knees to no hands   12/16/22 Therapeutic Exercise: to improve flexibility, strength and mobility.  Verbal and tactile cues throughout for technique Nustep L4 x 6 min  Seated piriformis stretch x 30 sec BLE Seated trunk rotations x 10 bil Seated diagonals with ball x 5 ea way Seated trunk extension with shoulder flexion x 10 Seated horiz ABD with RTB x 10 Seated PPT x 10  Manual Therapy: to decrease muscle spasm and pain and improve mobility Skilled palpation and monitoring of soft tissues during DN STM to bil gluteals and piriformis post DN Trigger Point Dry-Needling  Treatment instructions: Expect mild to moderate muscle soreness. S/S of pneumothorax if dry needled over a lung field, and to seek immediate medical attention should they occur. Patient verbalized understanding of these instructions and education. Patient Consent Given: Yes Education handout provided: Yes Muscles treated: bil gluteals and piriformis Electrical stimulation performed: No Parameters: N/A Treatment response/outcome: Twitch Response Elicited and Palpable Increase in Muscle Length  Modalities: MHP to bil gluteals and LB in sitting x 10 min post MT   12/09/22 Therapeutic Exercise: Nustep L4x11min Seated hamstring stretch with chair x 30 sec  BLE Seated piriformis stretch x 30 sec BLE Seated trunk rotations x 10 bil Standing trunk extension at wall attempted but pain Seated posterior pelvic tilts 10x5" Seated trunk extension with shoulder flexion x 10 Seated horiz ABD with extension x 10  Gait Training: 90 ft no AD- flexed trunk fatigue after 70' : 90 ft with rollator better trunk extension and stability  12/07/22 See pt ed  PATIENT EDUCATION:  Education details: HEP update  Person educated: Patient Education method: Explanation, Demonstration, Tactile cues, Verbal cues, and Handouts Education comprehension: verbalized understanding, returned demonstration, verbal cues required, and needs further education  HOME EXERCISE PROGRAM: Access Code: [email or web address] URL: [email or web address] Date: 12/21/2022 Prepared by: Raynelle Fanning  Exercises - Seated Hamstring Stretch with Chair  - 2 x daily - 7 x weekly - 1 sets - 3 reps - 20-30 sec hold - Seated Figure 4 Piriformis Stretch  - 2 x daily - 7 x weekly - 1 sets - 3 reps - 30-60 sec hold - Seated Trunk Rotation  - 1 x daily - 7 x weekly - 1 sets - 10 reps - 10 sec hold - Seated Thoracic Extension Arms Overhead  - 1 x daily - 7 x weekly - 2 sets - 10 reps - Seated Pelvic Tilt  - 1 x daily - 7 x weekly - 2 sets - 10 reps - Seated Diagonal Chops with Medicine Ball  - 1 x daily - 7 x weekly - 2 sets - 10 reps - Seated Shoulder Horizontal Abduction with Resistance - Palms Down  - 1 x daily - 7 x weekly - 2 sets - 10 reps - Seated Hip Flexor Stretch  - 2 x daily - 7 x weekly - 1 sets - 3 reps - 30-60 sec hold - Abdominal Press into Ball  - 1 x daily - 7 x weekly - 2 sets - 10 reps - 5 sec hold - Clamshell  - 1 x daily - 7 x weekly - 1-3 sets - 10 reps  ASSESSMENT:  CLINICAL IMPRESSION: Continued with progression of lumbar and hip strengthening to tolerance. Mr. Iqbal shows a good tolerance for exercises but needing cues with hip abduction and HS curls for slow, controlled  movement. He showed less control on L side with these exercises. His skin was fine after testing electrode adhesive however we did not do estim/TENS. Pt reported decrease in pain after MT.   OBJECTIVE IMPAIRMENTS: decreased activity tolerance, difficulty walking, decreased ROM, decreased strength, increased muscle spasms, impaired  flexibility, postural dysfunction, and pain.   ACTIVITY LIMITATIONS: carrying, lifting, bending, sitting, standing, squatting, sleeping, stairs, transfers, bed mobility, hygiene/grooming, and locomotion level  PARTICIPATION LIMITATIONS:  all ADLs limited  PERSONAL FACTORS: Time since onset of injury/illness/exacerbation and 3+ comorbidities: T2DM, CKD on dialysis, CHF, L3/4 discectomy  are also affecting patient's functional outcome.   REHAB POTENTIAL: Good  CLINICAL DECISION MAKING: Evolving/moderate complexity  EVALUATION COMPLEXITY: Moderate   GOALS: Goals reviewed with patient? Yes  SHORT TERM GOALS: Target date: 01/04/2023   Patient will be independent with initial HEP.  Baseline:  Goal status: IN PROGRESS  2.  Patient will report decreased pain by 25%.  Baseline:  Goal status: IN PROGRESS  LONG TERM GOALS: Target date: 02/01/2023    Patient will be independent with advanced/ongoing HEP to improve outcomes and carryover.  Baseline:  Goal status: IN PROGRESS  2.  Patient will report 50-75% improvement in low back and leg pain to improve QOL.  Baseline:  Goal status: IN PROGRESS  3.  Patient will demonstrate functional lumbar ROM to perform ADLs.   Baseline:  Goal status: IN PROGRESS  4.  Patient will demonstrate improved functional strength by increased 30 second chair stand test by 5. Baseline: 0 Goal status: IN PROGRESS  5.  Patient will report 34 on modified oswestry to demonstrate improved functional ability.  Baseline:  40 / 50 = 80.0 % Goal status: IN PROGRESS   6.  Patient will report ability to sleep a minimum of 5 hours  without waking from pain. Baseline:  Goal status: IN PROGRESS     PLAN:  PT FREQUENCY: 2x/week  PT DURATION: 8 weeks  PLANNED INTERVENTIONS: Therapeutic exercises, Therapeutic activity, Neuromuscular re-education, Balance training, Gait training, Patient/Family education, Self Care, Joint mobilization, Aquatic Therapy, Dry Needling, Electrical stimulation, Spinal mobilization, Cryotherapy, Moist heat, scar mobilization, Taping, Ultrasound, Ionotophoresis /ml Dexamethasone, and Manual therapy.  PLAN FOR NEXT SESSION: Trial of TENS (test electrode adhesive first) work on lumbar mobility, LE flexibility and strength.  modalities for pain.     Darleene Cleaver, PTA 12/23/2022, 2:09 PM

## 2022-12-25 ENCOUNTER — Telehealth: Payer: Self-pay | Admitting: Family Medicine

## 2022-12-25 NOTE — Telephone Encounter (Signed)
Faxed over office visit notes that we have, Made a note of fax cover sheet/called them as well but unable to get through that they need to contact the patients Endo office for their office notes.

## 2022-12-25 NOTE — Telephone Encounter (Signed)
[facility] [employer] called to get an update on a request for office notes for any diabetic office visits prior to march 18th. More especifically between Dec 2023 to March 01,2024.  For questions they can be reached at [phone] ext [phone]. Office notes can be faxed to [phone]. Please advise.

## 2022-12-27 NOTE — Therapy (Signed)
OUTPATIENT PHYSICAL THERAPY TREATMENT   Patient Name: Kenneth [name] MRN: [id number] DOB:[birth year], 67 y.o., male Today's Date: 12/28/2022  END OF SESSION:  PT End of Session - 12/28/22 1350     Visit Number 6    Date for PT Re-Evaluation 02/01/23    Authorization Type [employer] MCR    Progress Note Due on Visit 10    PT Start Time 1347    PT Stop Time 1427    PT Time Calculation (min) 40 min    Activity Tolerance Patient tolerated treatment well    Behavior During Therapy [facility] for tasks assessed/performed               Past Medical History:  Diagnosis Date   Arthritis    hands   CAD (coronary artery disease) 10/30/2016   NSTEMI 9/17 with CABG x 3 (LIMA-LAD, SVG-OM, SVG-RCA).   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   type 1 DM   Dyspnea    with exertion   ESRD on hemodialysis    "MWF; [place]" (03/30/2018)   Gastroesophageal reflux disease 08/18/2016   Hyperlipidemia    Hypertension    Ischemic rest pain of lower extremity 02/06/2015   Keratoma 02/27/2015   Metatarsal deformity 02/27/2015   PAF (paroxysmal atrial fibrillation)    Pronation deformity of both feet 02/27/2015   Past Surgical History:  Procedure Laterality Date   A/V FISTULAGRAM Left 03/06/2021   Procedure: A/V FISTULAGRAM;  Surgeon: Cephus Shelling, MD;  Location: [facility] INVASIVE CV LAB;  Service: Cardiovascular;  Laterality: Left;   A/V FISTULAGRAM Left 06/02/2022   Procedure: A/V Fistulagram;  Surgeon: Nada Libman, MD;  Location: [facility];  Service: Cardiovascular;  Laterality: Left;   AV FISTULA PLACEMENT Left 06/22/2016   Procedure: ARTERIOVENOUS (AV) FISTULA CREATION LEFT UPPER ARM;  Surgeon: Larina Earthly, MD;  Location: [facility] OR;  Service: Vascular;  Laterality: Left;   BIOPSY  12/19/2020   Procedure: BIOPSY;  Surgeon: Meryl Dare, MD;  Location: [facility]  ENDOSCOPY;  Service: Endoscopy;;   BIOPSY  12/15/2022   Procedure: BIOPSY;  Surgeon: Sherrilyn Rist, MD;  Location: [facility] ENDOSCOPY;  Service: Gastroenterology;;   CARDIAC CATHETERIZATION N/A 05/31/2016   Procedure: Left Heart Cath and Coronary Angiography;  Surgeon: Corky Crafts, MD;  Location: [facility] INVASIVE CV LAB;  Service: Cardiovascular;  Laterality: N/A;   CARDIAC CATHETERIZATION N/A 05/31/2016   Procedure: Right Heart Cath;  Surgeon: Corky Crafts, MD;  Location: [facility] INVASIVE CV LAB;  Service: Cardiovascular;  Laterality: N/A;   CARDIAC CATHETERIZATION N/A 05/31/2016   Procedure: IABP Insertion;  Surgeon: Corky Crafts, MD;  [facility]: [facility];  Service: Cardiovascular;  Laterality: N/A;   COLONOSCOPY WITH PROPOFOL N/A 12/20/2020   Procedure: COLONOSCOPY WITH PROPOFOL;  Surgeon: Napoleon Form, MD;  Location: [facility] ENDOSCOPY;  Service: Endoscopy;  Laterality: N/A;   COLONOSCOPY WITH PROPOFOL N/A 12/15/2022   Procedure: COLONOSCOPY WITH PROPOFOL;  Surgeon: Sherrilyn Rist, MD;  Location: [facility] ENDOSCOPY;  Service: Gastroenterology;  Laterality: N/A;   CORONARY ARTERY BYPASS GRAFT N/A 06/05/2016   Procedure: CORONARY ARTERY BYPASS GRAFTING (CABG) x3 LIMA to LAD -SVG to OM -SVG to RCA;  Surgeon: Kerin Perna, MD;  Location: [facility] OR;  Service: Open Heart Surgery;  Laterality: N/A;  CORONARY STENT INTERVENTION N/A 07/07/2017   Procedure: CORONARY STENT INTERVENTION;  Surgeon: Iran Ouch, MD;  [facility];  Service: Cardiovascular;  Laterality: N/A;   CORONARY STENT INTERVENTION N/A 03/30/2018   Procedure: CORONARY STENT INTERVENTION;  Surgeon: Swaziland, Peter M, MD;  Location: [facility] INVASIVE CV LAB;  Service: Cardiovascular;  Laterality: N/A;   ESOPHAGOGASTRODUODENOSCOPY (EGD) WITH PROPOFOL N/A 12/19/2020   Procedure: ESOPHAGOGASTRODUODENOSCOPY (EGD) WITH PROPOFOL;  Surgeon: Meryl Dare, MD;  Location: [facility] ENDOSCOPY;  Service: Endoscopy;  Laterality:  N/A;   ESOPHAGOGASTRODUODENOSCOPY (EGD) WITH PROPOFOL N/A 12/15/2022   Procedure: ESOPHAGOGASTRODUODENOSCOPY (EGD) WITH PROPOFOL;  Surgeon: Sherrilyn Rist, MD;  Location: [facility] ENDOSCOPY;  Service: Gastroenterology;  Laterality: N/A;   EXCHANGE OF A DIALYSIS CATHETER Right 04/16/2022   Procedure: EXCHANGE OF A DIALYSIS CATHETER;  Surgeon: Leonie Douglas, MD;  Location: [facility] OR;  Service: Vascular;  Laterality: Right;   GIVENS CAPSULE STUDY N/A 12/20/2020   Procedure: GIVENS CAPSULE STUDY;  Surgeon: Napoleon Form, MD;  Location: [facility] ENDOSCOPY;  Service: Endoscopy;  Laterality: N/A;   HEMOSTASIS CLIP PLACEMENT  12/15/2022   Procedure: HEMOSTASIS CLIP PLACEMENT;  Surgeon: Sherrilyn Rist, MD;  Location: [facility] ENDOSCOPY;  Service: Gastroenterology;;   INSERTION OF DIALYSIS CATHETER N/A 06/05/2016   Procedure: INSERTION OF DIALYSIS/trialysis CATHETER;  Surgeon: Kerin Perna, MD;  Location: [facility] OR;  Service: Vascular;  Laterality: N/A;   INSERTION OF DIALYSIS CATHETER Right 06/13/2016   Procedure: INSERTION OF DIALYSIS CATHETER RIGHT INTERNAL JUGULAR;  Surgeon: Fransisco Hertz, MD;  Location: [facility] OR;  Service: Vascular;  Laterality: Right;   INSERTION OF DIALYSIS CATHETER Right 09/07/2019   Procedure: Insertion Of Dialysis Catheter;  Surgeon: Larina Earthly, MD;  Location: [facility] OR;  Service: Vascular;  Laterality: Right;   INSERTION OF DIALYSIS CATHETER N/A 04/09/2022   Procedure: INSERTION OF TUNNELED DIALYSIS CATHETER;  Surgeon: Leonie Douglas, MD;  Location: [facility] OR;  Service: Vascular;  Laterality: N/A;   INTRAOPERATIVE TRANSESOPHAGEAL ECHOCARDIOGRAM N/A 06/05/2016   Procedure: INTRAOPERATIVE TRANSESOPHAGEAL ECHOCARDIOGRAM;  Surgeon: Kerin Perna, MD;  Location: [facility] OR;  Service: Open Heart Surgery;  Laterality: N/A;   LEFT HEART CATH AND CORS/GRAFTS ANGIOGRAPHY N/A 11/03/2016   Procedure: Left Heart Cath and Cors/Grafts Angiography;  Surgeon: Lennette Bihari, MD;  [facility];  Service:  Cardiovascular;  Laterality: N/A;   LEFT HEART CATH AND CORS/GRAFTS ANGIOGRAPHY N/A 07/07/2017   Procedure: LEFT HEART CATH AND CORS/GRAFTS ANGIOGRAPHY;  Surgeon: Laurey Morale, MD;  Location: [facility] INVASIVE CV LAB;  Service: Cardiovascular;  Laterality: N/A;   LEFT HEART CATH AND CORS/GRAFTS ANGIOGRAPHY N/A 03/30/2018   Procedure: LEFT HEART CATH AND CORS/GRAFTS ANGIOGRAPHY;  Surgeon: [place]azil[place], Peter M, MD;  Location: [facility] INVASIVE CV LAB;  Service: Cardiovascular;  Laterality: N/A;   LEFT HEART CATH AND CORS/GRAFTS ANGIOGRAPHY N/A 04/17/2019   Procedure: LEFT HEART CATH AND CORS/GRAFTS ANGIOGRAPHY;  Surgeon: Laurey Morale, MD;  Location: [facility] INVASIVE CV LAB;  Service: Cardiovascular;  Laterality: N/A;   LUMBAR LAMINECTOMY/DECOMPRESSION MICRODISCECTOMY Bilateral 09/01/2022   Procedure: Laminectomy and Foraminotomy - bilateral - Lumbar three-Lumbar four;  Surgeon: Julio Sicks, MD;  Location: [facility] OR;  Service: Neurosurgery;  Laterality: Bilateral;   PERIPHERAL VASCULAR BALLOON ANGIOPLASTY Left 06/02/2022   Procedure: PERIPHERAL VASCULAR BALLOON ANGIOPLASTY;  Surgeon: Nada Libman, MD;  Location: [facility];  Service: Cardiovascular;  Laterality: Left;   PERIPHERAL VASCULAR INTERVENTION Left 03/06/2021   Procedure: PERIPHERAL VASCULAR  INTERVENTION;  Surgeon: Cephus Shelling, MD;  Location: [facility] INVASIVE CV LAB;  Service: Cardiovascular;  Laterality: Left;   POLYPECTOMY  12/20/2020   Procedure: POLYPECTOMY;  Surgeon: Napoleon Form, MD;  Location: [facility] ENDOSCOPY;  Service: Endoscopy;;   POLYPECTOMY  12/15/2022   Procedure: POLYPECTOMY;  Surgeon: Sherrilyn Rist, MD;  Location: [facility] ENDOSCOPY;  Service: Gastroenterology;;   REVISION OF ARTERIOVENOUS GORETEX GRAFT Left 07/27/2019   Procedure: PLICATION OF ANEURYSM OF ARTERIOVENOUS FISTULA  LEFT ARM;  Surgeon: Larina Earthly, MD;  Location: [facility] OR;  Service: Vascular;  Laterality: Left;   REVISON OF ARTERIOVENOUS FISTULA Left 09/07/2019    Procedure: REVISON OF LEFT UPPER ARM ARTERIOVENOUS FISTULA;  Surgeon: Larina Earthly, MD;  Location: [facility] OR;  Service: Vascular;  Laterality: Left;   REVISON OF ARTERIOVENOUS FISTULA Left 04/09/2022   Procedure: LEFT ARM FISTULA REVISION;  Surgeon: Leonie Douglas, MD;  Location: [facility] OR;  Service: Vascular;  Laterality: Left;  PERIPHERAL NERVE BLOCK   REVISON OF ARTERIOVENOUS FISTULA Left 04/16/2022   Procedure: REVISON OF ARTERIOVENOUS FISTULA;  Surgeon: Leonie Douglas, MD;  Location: [facility] OR;  Service: Vascular;  Laterality: Left;   RIGHT/LEFT HEART CATH AND CORONARY ANGIOGRAPHY N/A 01/03/2021   Procedure: RIGHT/LEFT HEART CATH AND CORONARY ANGIOGRAPHY;  Surgeon: Laurey Morale, MD;  Location: [facility] INVASIVE CV LAB;  Service: Cardiovascular;  Laterality: N/A;   Patient Active Problem List   Diagnosis Date Noted   Gastric intestinal metaplasia 12/15/2022   Benign neoplasm of ascending colon 12/15/2022   Benign neoplasm of ileocecal valve 12/15/2022   Porokeratosis 11/10/2022   Depression, recurrent 10/06/2022   Lumbar stenosis with neurogenic claudication 09/01/2022   Chronic arthritis 08/11/2022   Tinea pedis 06/09/2022   Orthostatic hypotension 04/15/2022   Elevated troponin 04/15/2022   Complication associated with dialysis catheter 04/15/2022   Complication of AV dialysis fistula 04/15/2022   ESRD (end stage renal disease) 04/09/2022   Pulmonary emphysema 02/21/2022   Acute on chronic anemia 02/21/2022   Type 2 diabetes mellitus with hyperglycemia, with long-term current use of insulin 10/28/2021   Type 2 diabetes mellitus with diabetic polyneuropathy, with long-term current use of insulin 10/28/2021   Hypertension associated with diabetes 09/16/2021   Chronic bilateral low back pain with bilateral sciatica 09/16/2021   Partial seizure disorder    Acute respiratory failure with hypoxia 07/28/2021   Prolonged QT interval 01/01/2021   Hemoptysis 01/01/2021   Positive fecal occult blood  test    Symptomatic bradycardia 12/15/2020   Postural dizziness with presyncope 12/15/2020   Blood in stool 12/15/2020   Acute metabolic encephalopathy 08/29/2019   Altered mental status 08/27/2019   Unresponsive episode 08/27/2019   AF (paroxysmal atrial fibrillation) 08/27/2019   Status post coronary artery stent placement    NSTEMI (non-ST elevated myocardial infarction) 03/30/2018   Hypertensive heart disease 09/12/2017   Hyperlipidemia associated with type 2 diabetes mellitus 09/12/2017   Hyperkalemia 07/20/2017   Chronic diastolic CHF (congestive heart failure) 04/14/2017   Hypertensive emergency 04/14/2017   Anemia, chronic renal failure 04/11/2017   Fluid overload 04/11/2017   Diabetic foot ulcer 04/11/2017   Cellulitis in diabetic foot 01/18/2017   Chest pain 12/16/2016   Hypertensive urgency 12/16/2016   Pressure injury of skin 11/03/2016   Type 2 diabetes mellitus with chronic kidney disease on chronic dialysis, with long-term current use of insulin 10/31/2016   ESRD (end stage renal disease) on dialysis 10/30/2016   Atherosclerotic heart disease of native coronary  artery with other forms of angina pectoris with elevated troponin     Gastroesophageal reflux disease 08/18/2016   HCAP (healthcare-associated pneumonia)    Non-ST elevation (NSTEMI) myocardial infarction    Pronation deformity of both feet 02/27/2015   Metatarsal deformity 02/27/2015   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   Ischemic rest pain of lower extremity (HCC) 02/06/2015    PCP: Sharlene Dory, DO   REFERRING PROVIDER: Julio Sicks, MD   REFERRING DIAG: [phone] (ICD-10-CM) - Spinal stenosis, lumbar region with neurogenic claudication   Rationale for Evaluation and Treatment: Rehabilitation  THERAPY DIAG:  Radiculopathy, lumbar region  Cramp and spasm  Muscle weakness (generalized)  ONSET DATE: 3 weeks  SUBJECTIVE:                                                                                                                                                                                            SUBJECTIVE STATEMENT: Pain is much better  [place] Interpreter:   PERTINENT HISTORY:  T2DM, CKD on dialysis, CHF, L3/4 discectomy  PAIN:  Are you having pain? Yes: NPRS scale: 6/10 Pain location: low back and bil LE to feet Pain description: pain in back, electricity in legs Aggravating factors: everything Relieving factors: nothing  PRECAUTIONS: None  WEIGHT BEARING RESTRICTIONS: No  FALLS:  Has patient fallen in last 6 months? No  LIVING ENVIRONMENT: Lives with: lives with their family Lives in: House/apartment Stairs: Yes: Internal: 13 steps; on left going up and External: 5 steps; on left going up Has following equipment at home: Single point cane, Walker - 2 wheeled, and Environmental consultant - 4 wheeled  OCCUPATION: retired  PLOF: Independent with household mobility with device  PATIENT GOALS: get rid of pain  NEXT MD VISIT: 2 weeks now  OBJECTIVE:   DIAGNOSTIC FINDINGS:  MRI 10/26/22 IMPRESSION: 1. Interval posterior decompression and discectomy at L3-4. There is progressive loss of disc height with probable intradiscal vacuum phenomenon and a broad-based central and right-sided disc protrusion which appears slightly increased from the preoperative study. There is progressive mass effect on the thecal sac which is incompletely decompressed posteriorly. Right foraminal narrowing appears slightly worse with probable right L3 nerve root encroachment. 2. No other significant changes are identified. Multilevel spondylosis superimposed on congenitally short pedicles with resulting chronic multifactorial spinal stenosis from L1-2 through L4-5. 3. Reactive endplate edema at J0-9 without evidence of discitis/osteomyelitis.  PATIENT SURVEYS:  Modified Oswestry  40 / 50 = 80.0 %   SCREENING FOR RED FLAGS: Bowel or bladder incontinence: No Spinal  tumors: No Cauda equina syndrome: No Compression fracture: No Abdominal aneurysm: No  COGNITION:  Overall cognitive status: Within functional limits for tasks assessed     SENSATION: WFL  MUSCLE LENGTH: HS: tight bil L> R Piriformis: bil L> R Hip Flexors: NT, but likely tight due to posture in standing Heelcords: WFL   POSTURE: rounded shoulders, forward head, decreased lumbar lordosis, increased thoracic kyphosis, and posterior pelvic tilt  PALPATION: Palpation: TTP at Bil gluteals L >R, bil lumbar.  Spinal mobility NT due to patient's pain level.  LUMBAR ROM:   AROM eval  Flexion Hands to knees  Extension Unable to get to neutral - bends knees to extend backward  Right lateral flexion 80% limited  Left lateral flexion 90% limited  Right rotation 75% limited  Left rotation 80% limited   (Blank rows = not tested)  LOWER EXTREMITY ROM:   WFL for MMT, hip extension not tested but decreased based on patient's inability to stand in neutral.  LOWER EXTREMITY MMT:    MMT Right eval Left eval  Hip flexion 4 4-  Hip extension 4-* 4-*  Hip abduction 5 4+  Hip adduction 4 4-  Hip internal rotation    Hip external rotation    Knee flexion 5 4+  Knee extension 4+ 4  Ankle dorsiflexion 5 5   (Blank rows = not tested)   FUNCTIONAL TESTS:  30 seconds chair stand test: 12/09/22: score is 0 - able to do 3x w/o hands but used hands 1x; 01/07/23  12 reps no hands  GAIT: Distance walked: 40 Assistive device utilized: Walker - 4 wheeled Level of assistance: Complete Independence Comments: flexed trunk with walker too far ahead of him  TODAY'S TREATMENT:                                                                                                                              DATE:   12/28/22 Therapeutic Exercise: to improve flexibility, strength and mobility.  Verbal and tactile cues throughout for technique Nustep L5 x 6 min 30 sec chair test - 12 reps Standing hip  abduction 2x10 bil Standing HS curls x 7 bil Seated forward flexion and either side on green ball x 5 Standing marching x 10 bil Seated LAQ 2# 2x10 bil Seated march 2# 2x10 bil Lat pulls 20# 2x 10  Rows 15# 2x10  STM to bil lumbr paraspinals in sitting; MFR with ball to paraspinals and gluteals   12/23/22 Therapeutic Exercise: to improve flexibility, strength and mobility.  Verbal and tactile cues throughout for technique Nustep L5 x 6 min Standing hip abduction 2x10 bil Standing HS curls x 10 bil Standing marching x 10 bil Seated LAQ 2# 2x10 bil Seated march 2# 2x10 bil Lat pulls 20# x 10  Rows 15# 2x10  STM to bil lumbr paraspinals in sitting  12/21/22 Therapeutic Exercise: to improve flexibility, strength and mobility.  Verbal and tactile cues throughout for technique Nustep L4 x 6 min Seated hip flexor stretch 3x30 sec bil Standing: hipABD and  ext x 10 ea B (ext painful), marching x 6 ea Supine with lower legs on peanut: glute/HS sets 5 sec hold x 10 Hooklying ab set pushing peanut into thighs 5 sec hold x 10 S/L hip ABD x 10 B S/L clam x 10 B Supine clam with YTB x 10, L only x 10 Sit to stand x 5 hands to hands on knees to no hands   PATIENT EDUCATION:  Education details: HEP update  Person educated: Patient Education method: Explanation, Demonstration, Tactile cues, Verbal cues, and Handouts Education comprehension: verbalized understanding, returned demonstration, verbal cues required, and needs further education  HOME EXERCISE PROGRAM: Access Code: [email or web address] URL: [email or web address] Date: 12/21/2022 Prepared by: Raynelle Fanning  Exercises - Seated Hamstring Stretch with Chair  - 2 x daily - 7 x weekly - 1 sets - 3 reps - 20-30 sec hold - Seated Figure 4 Piriformis Stretch  - 2 x daily - 7 x weekly - 1 sets - 3 reps - 30-60 sec hold - Seated Trunk Rotation  - 1 x daily - 7 x weekly - 1 sets - 10 reps - 10 sec hold - Seated Thoracic Extension Arms  Overhead  - 1 x daily - 7 x weekly - 2 sets - 10 reps - Seated Pelvic Tilt  - 1 x daily - 7 x weekly - 2 sets - 10 reps - Seated Diagonal Chops with Medicine Ball  - 1 x daily - 7 x weekly - 2 sets - 10 reps - Seated Shoulder Horizontal Abduction with Resistance - Palms Down  - 1 x daily - 7 x weekly - 2 sets - 10 reps - Seated Hip Flexor Stretch  - 2 x daily - 7 x weekly - 1 sets - 3 reps - 30-60 sec hold - Abdominal Press into [place]  - 1 x daily - 7 x weekly - 2 sets - 10 reps - 5 sec hold - Clamshell  - 1 x daily - 7 x weekly - 1-3 sets - 10 reps  ASSESSMENT:  CLINICAL IMPRESSION: Kenneth [name] is progressing with short and LTGs. He reports 20% improvement overall in pain and is now able to sit to stand without UE assist. He is still challenged by standing hip ABD and knee flexion and requires a short rest after these exercises due to fatigue and pain. Excellent response to STM and MFR with ball today. He reports compliance with HEP. Neale continues to demonstrate potential for improvement and would benefit from continued skilled therapy to address impairments.     OBJECTIVE IMPAIRMENTS: decreased activity tolerance, difficulty walking, decreased ROM, decreased strength, increased muscle spasms, impaired flexibility, postural dysfunction, and pain.   ACTIVITY LIMITATIONS: carrying, lifting, bending, sitting, standing, squatting, sleeping, stairs, transfers, bed mobility, hygiene/grooming, and locomotion level  PARTICIPATION LIMITATIONS:  all ADLs limited  PERSONAL FACTORS: Time since onset of injury/illness/exacerbation and 3+ comorbidities: T2DM, CKD on dialysis, CHF, L3/4 discectomy  are also affecting patient's functional outcome.   REHAB POTENTIAL: Good  CLINICAL DECISION MAKING: Evolving/moderate complexity  EVALUATION COMPLEXITY: Moderate   GOALS: Goals reviewed with patient? Yes  SHORT TERM GOALS: Target date: 01/04/2023   Patient will be independent with initial HEP.  Baseline:   Goal status: IN PROGRESS  2.  Patient will report decreased pain by 25%.  Baseline:  Goal status: IN PROGRESS - 01/07/23 20%  LONG TERM GOALS: Target date: 02/01/2023    Patient will be independent with advanced/ongoing HEP to improve outcomes and carryover.  Baseline:  Goal status: IN PROGRESS  2.  Patient will report 50-75% improvement in low back and leg pain to improve QOL.  Baseline:  Goal status: IN PROGRESS  3.  Patient will demonstrate functional lumbar ROM to perform ADLs.   Baseline:  Goal status: IN PROGRESS  4.  Patient will demonstrate improved functional strength by increased 30 second chair stand test by 5. Baseline: 0 Goal status: MET 12 reps  5.  Patient will report 34 on modified oswestry to demonstrate improved functional ability.  Baseline:  40 / 50 = 80.0 % Goal status: IN PROGRESS   6.  Patient will report ability to sleep a minimum of 5 hours without waking from pain. Baseline:  Goal status: IN PROGRESS     PLAN:  PT FREQUENCY: 2x/week  PT DURATION: 8 weeks  PLANNED INTERVENTIONS: Therapeutic exercises, Therapeutic activity, Neuromuscular re-education, Balance training, Gait training, Patient/Family education, Self Care, Joint mobilization, Aquatic Therapy, Dry Needling, Electrical stimulation, Spinal mobilization, Cryotherapy, Moist heat, scar mobilization, Taping, Ultrasound, Ionotophoresis /ml Dexamethasone, and Manual therapy.  PLAN FOR NEXT SESSION:  work on lumbar mobility, LE flexibility and strength.  MT for pain.     Cheyrl Buley, PT 12/28/2022, 2:30 PM

## 2022-12-28 ENCOUNTER — Ambulatory Visit: Payer: 59 | Admitting: Physical Therapy

## 2022-12-28 ENCOUNTER — Encounter: Payer: Self-pay | Admitting: Physical Therapy

## 2022-12-28 DIAGNOSIS — M5416 Radiculopathy, lumbar region: Secondary | ICD-10-CM

## 2022-12-28 DIAGNOSIS — R252 Cramp and spasm: Secondary | ICD-10-CM

## 2022-12-28 DIAGNOSIS — M6281 Muscle weakness (generalized): Secondary | ICD-10-CM

## 2022-12-30 ENCOUNTER — Ambulatory Visit: Payer: 59

## 2022-12-30 DIAGNOSIS — M6281 Muscle weakness (generalized): Secondary | ICD-10-CM

## 2022-12-30 DIAGNOSIS — R252 Cramp and spasm: Secondary | ICD-10-CM

## 2022-12-30 DIAGNOSIS — M5416 Radiculopathy, lumbar region: Secondary | ICD-10-CM | POA: Diagnosis not present

## 2022-12-30 NOTE — Therapy (Signed)
OUTPATIENT PHYSICAL THERAPY TREATMENT   Patient Name: Kenneth [name] MRN: [id number] DOB:Sep 09, 1956, 67 y.o., male Today's Date: 12/30/2022  END OF SESSION:  PT End of Session - 12/30/22 1325     Visit Number 7    Date for PT Re-Evaluation 02/01/23    Authorization Type U[employer] MCR    Progress Note Due on Visit 10    PT Start Time 1315    PT Stop Time 1359    PT Time Calculation (min) 44 min    Activity Tolerance Patient tolerated treatment well    Behavior During Therapy [facility] for tasks assessed/performed               Past Medical History:  Diagnosis Date   Arthritis    hands   CAD (coronary artery disease) 10/30/2016   NSTEMI 9/17 with CABG x 3 (LIMA-LAD, SVG-OM, SVG-RCA).   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   type 1 DM   Dyspnea    with exertion   ESRD on hemodialysis    "MWF; [place]" (03/30/2018)   Gastroesophageal reflux disease 08/18/2016   Hyperlipidemia    Hypertension    Ischemic rest pain of lower extremity 02/06/2015   Keratoma 02/27/2015   Metatarsal deformity 02/27/2015   PAF (paroxysmal atrial fibrillation)    Pronation deformity of both feet 02/27/2015   Past Surgical History:  Procedure Laterality Date   A/V FISTULAGRAM Left 03/06/2021   Procedure: A/V FISTULAGRAM;  Surgeon: Cephus Shelling, MD;  Location: [facility] INVASIVE CV LAB;  Service: Cardiovascular;  Laterality: Left;   A/V FISTULAGRAM Left 06/02/2022   Procedure: A/V Fistulagram;  Surgeon: Nada Libman, MD;  Location: [facility] INV[facility];  Service: Cardiovascular;  Laterality: Left;   AV FISTULA PLACEMENT Left 06/22/2016   Procedure: ARTERIOVENOUS (AV) FISTULA CREATION LEFT UPPER ARM;  Surgeon: Larina Earthly, MD;  Location: [facility] OR;  Service: Vascular;  Laterality: Left;   BIOPSY  12/19/2020   Procedure: BIOPSY;  Surgeon: Meryl Dare, MD;  Location: [facility]  ENDOSCOPY;  Service: Endoscopy;;   BIOPSY  12/15/2022   Procedure: BIOPSY;  Surgeon: Sherrilyn Rist, MD;  Location: [facility] ENDOSCOPY;  Service: Gastroenterology;;   CARDIAC CATHETERIZATION N/A 05/31/2016   Procedure: Left Heart Cath and Coronary Angiography;  Surgeon: Corky Crafts, MD;  Location: [facility] INVASIVE CV LAB;  Service: Cardiovascular;  Laterality: N/A;   CARDIAC CATHETERIZATION N/A 05/31/2016   Procedure: Right Heart Cath;  Surgeon: Corky Crafts, MD;  Location: [facility] INVASIVE CV LAB;  Service: Cardiovascular;  Laterality: N/A;   CARDIAC CATHETERIZATION N/A 05/31/2016   Procedure: IABP Insertion;  Surgeon: Corky Crafts, MD;  [facility]: [facility] INV[facility] CV LAB;  Service: Cardiovascular;  Laterality: N/A;   COLONOSCOPY WITH PROPOFOL N/A 12/20/2020   Procedure: COLONOSCOPY WITH PROPOFOL;  Surgeon: Napoleon Form, MD;  Location: [facility] ENDOSCOPY;  Service: Endoscopy;  Laterality: N/A;   COLONOSCOPY WITH PROPOFOL N/A 12/15/2022   Procedure: COLONOSCOPY WITH PROPOFOL;  Surgeon: Sherrilyn Rist, MD;  Location: [facility] ENDOSCOPY;  Service: Gastroenterology;  Laterality: N/A;   CORONARY ARTERY BYPASS GRAFT N/A 06/05/2016   Procedure: CORONARY ARTERY BYPASS GRAFTING (CABG) x3 LIMA to LAD -SVG to OM -SVG to RCA;  Surgeon: Kerin Perna, MD;  Location: [facility] OR;  Service: Open Heart Surgery;  Laterality: N/A;  CORONARY STENT INTERVENTION N/A 07/07/2017   Procedure: CORONARY STENT INTERVENTION;  Surgeon: Iran Ouch, MD;  [facility];  Service: Cardiovascular;  Laterality: N/A;   CORONARY STENT INTERVENTION N/A 03/30/2018   Procedure: CORONARY STENT INTERVENTION;  Surgeon: Swazil[place], Peter M, MD;  Location: [facility] INVASIVE CV LAB;  Service: Cardiovascular;  Laterality: N/A;   ESOPHAGOGASTRODUODENOSCOPY (EGD) WITH PROPOFOL N/A 12/19/2020   Procedure: ESOPHAGOGASTRODUODENOSCOPY (EGD) WITH PROPOFOL;  Surgeon: Meryl Dare, MD;  Location: [facility] ENDOSCOPY;  Service: Endoscopy;  Laterality:  N/A;   ESOPHAGOGASTRODUODENOSCOPY (EGD) WITH PROPOFOL N/A 12/15/2022   Procedure: ESOPHAGOGASTRODUODENOSCOPY (EGD) WITH PROPOFOL;  Surgeon: Sherrilyn Rist, MD;  Location: [facility] ENDOSCOPY;  Service: Gastroenterology;  Laterality: N/A;   EXCHANGE OF A DIALYSIS CATHETER Right 04/16/2022   Procedure: EXCHANGE OF A DIALYSIS CATHETER;  Surgeon: Leonie Douglas, MD;  Location: [facility] OR;  Service: Vascular;  Laterality: Right;   GIVENS CAPSULE STUDY N/A 12/20/2020   Procedure: GIVENS CAPSULE STUDY;  Surgeon: Napoleon Form, MD;  Location: [facility] ENDOSCOPY;  Service: Endoscopy;  Laterality: N/A;   HEMOSTASIS CLIP PLACEMENT  12/15/2022   Procedure: HEMOSTASIS CLIP PLACEMENT;  Surgeon: Sherrilyn Rist, MD;  Location: [facility] ENDOSCOPY;  Service: Gastroenterology;;   INSERTION OF DIALYSIS CATHETER N/A 06/05/2016   Procedure: INSERTION OF DIALYSIS/trialysis CATHETER;  Surgeon: Kerin Perna, MD;  Location: [facility] OR;  Service: Vascular;  Laterality: N/A;   INSERTION OF DIALYSIS CATHETER Right 06/13/2016   Procedure: INSERTION OF DIALYSIS CATHETER RIGHT INTERNAL JUGULAR;  Surgeon: Fransisco Hertz, MD;  Location: [facility] OR;  Service: Vascular;  Laterality: Right;   INSERTION OF DIALYSIS CATHETER Right 09/07/2019   Procedure: Insertion Of Dialysis Catheter;  Surgeon: Larina Earthly, MD;  Location: [facility] OR;  Service: Vascular;  Laterality: Right;   INSERTION OF DIALYSIS CATHETER N/A 04/09/2022   Procedure: INSERTION OF TUNNELED DIALYSIS CATHETER;  Surgeon: Leonie Douglas, MD;  Location: [facility] OR;  Service: Vascular;  Laterality: N/A;   INTRAOPERATIVE TRANSESOPHAGEAL ECHOCARDIOGRAM N/A 06/05/2016   Procedure: INTRAOPERATIVE TRANSESOPHAGEAL ECHOCARDIOGRAM;  Surgeon: Kerin Perna, MD;  Location: [facility] OR;  Service: Open Heart Surgery;  Laterality: N/A;   LEFT HEART CATH AND CORS/GRAFTS ANGIOGRAPHY N/A 11/03/2016   Procedure: Left Heart Cath and Cors/Grafts Angiography;  Surgeon: Lennette Bihari, MD;  [facility]: [facility] INVAS[facility] CV LAB;  Service:  Cardiovascular;  Laterality: N/A;   LEFT HEART CATH AND CORS/GRAFTS ANGIOGRAPHY N/A 07/07/2017   Procedure: LEFT HEART CATH AND CORS/GRAFTS ANGIOGRAPHY;  Surgeon: Laurey Morale, MD;  Location: [facility] INVASIVE CV LAB;  Service: Cardiovascular;  Laterality: N/A;   LEFT HEART CATH AND CORS/GRAFTS ANGIOGRAPHY N/A 03/30/2018   Procedure: LEFT HEART CATH AND CORS/GRAFTS ANGIOGRAPHY;  Surgeon: [place]il[place], Peter M, MD;  Location: [facility] INVASIVE CV LAB;  Service: Cardiovascular;  Laterality: N/A;   LEFT HEART CATH AND CORS/GRAFTS ANGIOGRAPHY N/A 04/17/2019   Procedure: LEFT HEART CATH AND CORS/GRAFTS ANGIOGRAPHY;  Surgeon: Laurey Morale, MD;  Location: [facility] INVASIVE CV LAB;  Service: Cardiovascular;  Laterality: N/A;   LUMBAR LAMINECTOMY/DECOMPRESSION MICRODISCECTOMY Bilateral 09/01/2022   Procedure: Laminectomy and Foraminotomy - bilateral - Lumbar three-Lumbar four;  Surgeon: Julio Sicks, MD;  Location: [facility] OR;  Service: Neurosurgery;  Laterality: Bilateral;   PERIPHERAL VASCULAR BALLOON ANGIOPLASTY Left 06/02/2022   Procedure: PERIPHERAL VASCULAR BALLOON ANGIOPLASTY;  Surgeon: Nada Libman, MD;  Location: [facility] CV [facility];  Service: Cardiovascular;  Laterality: Left;   PERIPHERAL VASCULAR INTERVENTION Left 03/06/2021   Procedure: PERIPHERAL VASCULAR  INTERVENTION;  Surgeon: Cephus Shelling, MD;  Location: [facility] INVASIVE CV LAB;  Service: Cardiovascular;  Laterality: Left;   POLYPECTOMY  12/20/2020   Procedure: POLYPECTOMY;  Surgeon: Napoleon Form, MD;  Location: [facility] ENDOSCOPY;  Service: Endoscopy;;   POLYPECTOMY  12/15/2022   Procedure: POLYPECTOMY;  Surgeon: Sherrilyn Rist, MD;  Location: [facility] ENDOSCOPY;  Service: Gastroenterology;;   REVISION OF ARTERIOVENOUS GORETEX GRAFT Left 07/27/2019   Procedure: PLICATION OF ANEURYSM OF ARTERIOVENOUS FISTULA  LEFT ARM;  Surgeon: Larina Earthly, MD;  Location: [facility] OR;  Service: Vascular;  Laterality: Left;   REVISON OF ARTERIOVENOUS FISTULA Left 09/07/2019    Procedure: REVISON OF LEFT UPPER ARM ARTERIOVENOUS FISTULA;  Surgeon: Larina Earthly, MD;  Location: [facility] OR;  Service: Vascular;  Laterality: Left;   REVISON OF ARTERIOVENOUS FISTULA Left 04/09/2022   Procedure: LEFT ARM FISTULA REVISION;  Surgeon: Leonie Douglas, MD;  Location: [facility] OR;  Service: Vascular;  Laterality: Left;  PERIPHERAL NERVE BLOCK   REVISON OF ARTERIOVENOUS FISTULA Left 04/16/2022   Procedure: REVISON OF ARTERIOVENOUS FISTULA;  Surgeon: Leonie Douglas, MD;  Location: [facility] OR;  Service: Vascular;  Laterality: Left;   RIGHT/LEFT HEART CATH AND CORONARY ANGIOGRAPHY N/A 01/03/2021   Procedure: RIGHT/LEFT HEART CATH AND CORONARY ANGIOGRAPHY;  Surgeon: Laurey Morale, MD;  Location: [facility] INVASIVE CV LAB;  Service: Cardiovascular;  Laterality: N/A;   Patient Active Problem List   Diagnosis Date Noted   Gastric intestinal metaplasia 12/15/2022   Benign neoplasm of ascending colon 12/15/2022   Benign neoplasm of ileocecal valve 12/15/2022   Porokeratosis 11/10/2022   Depression, recurrent 10/06/2022   Lumbar stenosis with neurogenic claudication 09/01/2022   Chronic arthritis 08/11/2022   Tinea pedis 06/09/2022   Orthostatic hypotension 04/15/2022   Elevated troponin 04/15/2022   Complication associated with dialysis catheter 04/15/2022   Complication of AV dialysis fistula 04/15/2022   ESRD (end stage renal disease) 04/09/2022   Pulmonary emphysema 02/21/2022   Acute on chronic anemia 02/21/2022   Type 2 diabetes mellitus with hyperglycemia, with long-term current use of insulin 10/28/2021   Type 2 diabetes mellitus with diabetic polyneuropathy, with long-term current use of insulin 10/28/2021   Hypertension associated with diabetes 09/16/2021   Chronic bilateral low back pain with bilateral sciatica 09/16/2021   Partial seizure disorder    Acute respiratory failure with hypoxia 07/28/2021   Prolonged QT interval 01/01/2021   Hemoptysis 01/01/2021   Positive fecal occult blood  test    Symptomatic bradycardia 12/15/2020   Postural dizziness with presyncope 12/15/2020   Blood in stool 12/15/2020   Acute metabolic encephalopathy 08/29/2019   Altered mental status 08/27/2019   Unresponsive episode 08/27/2019   AF (paroxysmal atrial fibrillation) 08/27/2019   Status post coronary artery stent placement    NSTEMI (non-ST elevated myocardial infarction) 03/30/2018   Hypertensive heart disease 09/12/2017   Hyperlipidemia associated with type 2 diabetes mellitus 09/12/2017   Hyperkalemia 07/20/2017   Chronic diastolic CHF (congestive heart failure) 04/14/2017   Hypertensive emergency 04/14/2017   Anemia, chronic renal failure 04/11/2017   Fluid overload 04/11/2017   Diabetic foot ulcer 04/11/2017   Cellulitis in diabetic foot 01/18/2017   Chest pain 12/16/2016   Hypertensive urgency 12/16/2016   Pressure injury of skin 11/03/2016   Type 2 diabetes mellitus with chronic kidney disease on chronic dialysis, with long-term current use of insulin 10/31/2016   ESRD (end stage renal disease) on dialysis 10/30/2016   Atherosclerotic heart disease of native coronary  artery with other forms of angina pectoris with elevated troponin     Gastroesophageal reflux disease 08/18/2016   HCAP (healthcare-associated pneumonia)    Non-ST elevation (NSTEMI) myocardial infarction    Pronation deformity of both feet 02/27/2015   Metatarsal deformity 02/27/2015   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   Ischemic rest pain of lower extremity (HCC) 02/06/2015    PCP: Sharlene Dory, DO   REFERRING PROVIDER: Julio Sicks, MD   REFERRING DIAG: [phone] (ICD-10-CM) - Spinal stenosis, lumbar region with neurogenic claudication   Rationale for Evaluation and Treatment: Rehabilitation  THERAPY DIAG:  Radiculopathy, lumbar region  Cramp and spasm  Muscle weakness (generalized)  ONSET DATE: 3 weeks  SUBJECTIVE:                                                                                                                                                                                            SUBJECTIVE STATEMENT: Pain is much better   [place] Interpreter:   PERTINENT HISTORY:  T2DM, CKD on dialysis, CHF, L3/4 discectomy  PAIN:  Are you having pain? Yes: NPRS scale: 6/10 Pain location: low back and bil LE to feet Pain description: pain in back, electricity in legs Aggravating factors: everything Relieving factors: nothing  PRECAUTIONS: None  WEIGHT BEARING RESTRICTIONS: No  FALLS:  Has patient fallen in last 6 months? No  LIVING ENVIRONMENT: Lives with: lives with their family Lives in: House/apartment Stairs: Yes: Internal: 13 steps; on left going up and External: 5 steps; on left going up Has following equipment at home: Single point cane, Walker - 2 wheeled, and Environmental consultant - 4 wheeled  OCCUPATION: retired  PLOF: Independent with household mobility with device  PATIENT GOALS: get rid of pain  NEXT MD VISIT: 2 weeks now  OBJECTIVE:   DIAGNOSTIC FINDINGS:  MRI 10/26/22 IMPRESSION: 1. Interval posterior decompression and discectomy at L3-4. There is progressive loss of disc height with probable intradiscal vacuum phenomenon and a broad-based central and right-sided disc protrusion which appears slightly increased from the preoperative study. There is progressive mass effect on the thecal sac which is incompletely decompressed posteriorly. Right foraminal narrowing appears slightly worse with probable right L3 nerve root encroachment. 2. No other significant changes are identified. Multilevel spondylosis superimposed on congenitally short pedicles with resulting chronic multifactorial spinal stenosis from L1-2 through L4-5. 3. Reactive endplate edema at B1-4 without evidence of discitis/osteomyelitis.  PATIENT SURVEYS:  Modified Oswestry  40 / 50 = 80.0 %   SCREENING FOR RED FLAGS: Bowel or bladder incontinence: No Spinal  tumors: No Cauda equina syndrome: No Compression fracture: No Abdominal aneurysm: No  COGNITION: Overall cognitive status: Within functional limits for tasks assessed     SENSATION: WFL  MUSCLE LENGTH: HS: tight bil L> R Piriformis: bil L> R Hip Flexors: NT, but likely tight due to posture in standing Heelcords: WFL   POSTURE: rounded shoulders, forward head, decreased lumbar lordosis, increased thoracic kyphosis, and posterior pelvic tilt  PALPATION: Palpation: TTP at Bil gluteals L >R, bil lumbar.  Spinal mobility NT due to patient's pain level.  LUMBAR ROM:   AROM eval  Flexion Hands to knees  Extension Unable to get to neutral - bends knees to extend backward  Right lateral flexion 80% limited  Left lateral flexion 90% limited  Right rotation 75% limited  Left rotation 80% limited   (Blank rows = not tested)  LOWER EXTREMITY ROM:   WFL for MMT, hip extension not tested but decreased based on patient's inability to stand in neutral.  LOWER EXTREMITY MMT:    MMT Right eval Left eval  Hip flexion 4 4-  Hip extension 4-* 4-*  Hip abduction 5 4+  Hip adduction 4 4-  Hip internal rotation    Hip external rotation    Knee flexion 5 4+  Knee extension 4+ 4  Ankle dorsiflexion 5 5   (Blank rows = not tested)   FUNCTIONAL TESTS:  30 seconds chair stand test: 12/09/22: score is 0 - able to do 3x w/o hands but used hands 1x; 01/07/23  12 reps no hands  GAIT: Distance walked: 40 Assistive device utilized: Walker - 4 wheeled Level of assistance: Complete Independence Comments: flexed trunk with walker too far ahead of him  TODAY'S TREATMENT:                                                                                                                              DATE:  12/30/22 Therapeutic Exercise: to improve flexibility, strength and mobility.  Verbal and tactile cues throughout for technique Nustep L5 x 7 min Rows 25# 3x10 - cues to keep shoulders down and  retract Standing lumbar extension standing against wall x 10 - cues to avoid pain S/L L hip ADD with ball 10x both S/L L clamshells 10x both  S/L reverse clamshells 10x both  12/28/22 Therapeutic Exercise: to improve flexibility, strength and mobility.  Verbal and tactile cues throughout for technique Nustep L5 x 6 min 30 sec chair test - 12 reps Standing hip abduction 2x10 bil Standing HS curls x 7 bil Seated forward flexion and either side on green ball x 5 Standing marching x 10 bil Seated LAQ 2# 2x10 bil Seated march 2# 2x10 bil Lat pulls 20# 2x 10  Rows 15# 2x10  STM to bil lumbr paraspinals in sitting; MFR with ball to paraspinals and gluteals   12/23/22 Therapeutic Exercise: to improve flexibility, strength and mobility.  Verbal and tactile cues throughout for technique Nustep L5 x 6 min Standing hip abduction 2x10 bil Standing HS curls x 10  bil Standing marching x 10 bil Seated LAQ 2# 2x10 bil Seated march 2# 2x10 bil Lat pulls 20# x 10  Rows 15# 2x10  STM to bil lumbr paraspinals in sitting  12/21/22 Therapeutic Exercise: to improve flexibility, strength and mobility.  Verbal and tactile cues throughout for technique Nustep L4 x 6 min Seated hip flexor stretch 3x30 sec bil Standing: hipABD and ext x 10 ea B (ext painful), marching x 6 ea Supine with lower legs on peanut: glute/HS sets 5 sec hold x 10 Hooklying ab set pushing peanut into thighs 5 sec hold x 10 S/L hip ABD x 10 B S/L clam x 10 B Supine clam with YTB x 10, L only x 10 Sit to stand x 5 hands to hands on knees to no hands   PATIENT EDUCATION:  Education details: HEP update  Person educated: Patient Education method: Explanation, Demonstration, Tactile cues, Verbal cues, and Handouts Education comprehension: verbalized understanding, returned demonstration, verbal cues required, and needs further education  HOME EXERCISE PROGRAM: Access Code: [email or web address] URL:  [email or web address] Date: 12/30/2022 Prepared by: Verta Ellen  Exercises - Seated Hamstring Stretch with Chair  - 2 x daily - 7 x weekly - 1 sets - 3 reps - 20-30 sec hold - Seated Figure 4 Piriformis Stretch  - 2 x daily - 7 x weekly - 1 sets - 3 reps - 30-60 sec hold - Seated Trunk Rotation  - 1 x daily - 7 x weekly - 1 sets - 10 reps - 10 sec hold - Seated Thoracic Extension Arms Overhead  - 1 x daily - 7 x weekly - 2 sets - 10 reps - Seated Pelvic Tilt  - 1 x daily - 7 x weekly - 2 sets - 10 reps - Seated Diagonal Chops with Medicine Ball  - 1 x daily - 7 x weekly - 2 sets - 10 reps - Seated Shoulder Horizontal Abduction with Resistance - Palms Down  - 1 x daily - 7 x weekly - 2 sets - 10 reps - Seated Hip Flexor Stretch  - 2 x daily - 7 x weekly - 1 sets - 3 reps - 30-60 sec hold - Abdominal Press into [place]  - 1 x daily - 7 x weekly - 2 sets - 10 reps - 5 sec hold - Clamshell  - 1 x daily - 7 x weekly - 1-3 sets - 10 reps - Sidelying Reverse Clamshell  - 1 x daily - 7 x weekly - 1-3 sets - 10 reps - Sidelying Hip Adduction Isometric with Ball  - 1 x daily - 7 x weekly - 1-3 sets - 10 reps - 5 sec hold hold  ASSESSMENT:  CLINICAL IMPRESSION: Mr Jarnigan continues to have flexed posture at rest. We worked on improving posture with exercises and trying to engage the posterior shoulder girdle more. Patient reports not sleeping much d/t LBP, we did try some side lying exercises to help with this for him to do before going to bed or when pain worsens. He showed a good tolerance for exercises.    OBJECTIVE IMPAIRMENTS: decreased activity tolerance, difficulty walking, decreased ROM, decreased strength, increased muscle spasms, impaired flexibility, postural dysfunction, and pain.   ACTIVITY LIMITATIONS: carrying, lifting, bending, sitting, standing, squatting, sleeping, stairs, transfers, bed mobility, hygiene/grooming, and locomotion level  PARTICIPATION LIMITATIONS:  all  ADLs limited  PERSONAL FACTORS: Time since onset of injury/illness/exacerbation and 3+ comorbidities: T2DM, CKD on dialysis, CHF, L3/4 discectomy  are also affecting  patient's functional outcome.   REHAB POTENTIAL: Good  CLINICAL DECISION MAKING: Evolving/moderate complexity  EVALUATION COMPLEXITY: Moderate   GOALS: Goals reviewed with patient? Yes  SHORT TERM GOALS: Target date: 01/04/2023   Patient will be independent with initial HEP.  Baseline:  Goal status: IN PROGRESS  2.  Patient will report decreased pain by 25%.  Baseline:  Goal status: IN PROGRESS - 01/07/23 20%  LONG TERM GOALS: Target date: 02/01/2023    Patient will be independent with advanced/ongoing HEP to improve outcomes and carryover.  Baseline:  Goal status: IN PROGRESS  2.  Patient will report 50-75% improvement in low back and leg pain to improve QOL.  Baseline:  Goal status: IN PROGRESS  3.  Patient will demonstrate functional lumbar ROM to perform ADLs.   Baseline:  Goal status: IN PROGRESS  4.  Patient will demonstrate improved functional strength by increased 30 second chair stand test by 5. Baseline: 0 Goal status: MET 12 reps  5.  Patient will report 34 on modified oswestry to demonstrate improved functional ability.  Baseline:  40 / 50 = 80.0 % Goal status: IN PROGRESS   6.  Patient will report ability to sleep a minimum of 5 hours without waking from pain. Baseline:  Goal status: IN PROGRESS     PLAN:  PT FREQUENCY: 2x/week  PT DURATION: 8 weeks  PLANNED INTERVENTIONS: Therapeutic exercises, Therapeutic activity, Neuromuscular re-education, Balance training, Gait training, Patient/Family education, Self Care, Joint mobilization, Aquatic Therapy, Dry Needling, Electrical stimulation, Spinal mobilization, Cryotherapy, Moist heat, scar mobilization, Taping, Ultrasound, Ionotophoresis 4mg /ml Dexamethasone, and Manual therapy.  PLAN FOR NEXT SESSION:  work on lumbar mobility, LE  flexibility and strength.  MT for pain.     Darleene Cleaver, PTA 12/30/2022, 2:29 PM

## 2023-01-03 NOTE — Therapy (Signed)
OUTPATIENT PHYSICAL THERAPY TREATMENT   Patient Name: Kenneth [name] MRN: [id number] DOB:Jan 08, 1956, 67 y.o., male Today's Date: 01/04/2023  END OF SESSION:  PT End of Session - 01/04/23 1348     Visit Number 8    Date for PT Re-Evaluation 02/01/23    Authorization Type U[employer] MCR    Progress Note Due on Visit 10    PT Start Time 1348    PT Stop Time 1431    PT Time Calculation (min) 43 min    Activity Tolerance Patient tolerated treatment well    Behavior During Therapy [facility] for tasks assessed/performed                Past Medical History:  Diagnosis Date   Arthritis    hands   CAD (coronary artery disease) 10/30/2016   NSTEMI 9/17 with CABG x 3 (LIMA-LAD, SVG-OM, SVG-RCA).   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   type 1 DM   Dyspnea    with exertion   ESRD on hemodialysis    "MWF; [place]" (03/30/2018)   Gastroesophageal reflux disease 08/18/2016   Hyperlipidemia    Hypertension    Ischemic rest pain of lower extremity 02/06/2015   Keratoma 02/27/2015   Metatarsal deformity 02/27/2015   PAF (paroxysmal atrial fibrillation)    Pronation deformity of both feet 02/27/2015   Past Surgical History:  Procedure Laterality Date   A/V FISTULAGRAM Left 03/06/2021   Procedure: A/V FISTULAGRAM;  Surgeon: Cephus Shelling, MD;  [facility]: [facility] INVASIVE CV LAB;  Service: Cardiovascular;  Laterality: Left;   A/V FISTULAGRAM Left 06/02/2022   Procedure: A/V Fistulagram;  Surgeon: Nada Libman, MD;  Location: [facility] INV[facility];  Service: Cardiovascular;  Laterality: Left;   AV FISTULA PLACEMENT Left 06/22/2016   Procedure: ARTERIOVENOUS (AV) FISTULA CREATION LEFT UPPER ARM;  Surgeon: Larina Earthly, MD;  Location: [facility] OR;  Service: Vascular;  Laterality: Left;   BIOPSY  12/19/2020   Procedure: BIOPSY;  Surgeon: Meryl Dare, MD;  Location: [facility]  ENDOSCOPY;  Service: Endoscopy;;   BIOPSY  12/15/2022   Procedure: BIOPSY;  Surgeon: Sherrilyn Rist, MD;  Location: [facility] ENDOSCOPY;  Service: Gastroenterology;;   CARDIAC CATHETERIZATION N/A 05/31/2016   Procedure: Left Heart Cath and Coronary Angiography;  Surgeon: Corky Crafts, MD;  Location: [facility] INVASIVE CV LAB;  Service: Cardiovascular;  Laterality: N/A;   CARDIAC CATHETERIZATION N/A 05/31/2016   Procedure: Right Heart Cath;  Surgeon: Corky Crafts, MD;  Location: [facility] INVASIVE CV LAB;  Service: Cardiovascular;  Laterality: N/A;   CARDIAC CATHETERIZATION N/A 05/31/2016   Procedure: IABP Insertion;  Surgeon: Corky Crafts, MD;  [facility]: [facility];  Service: Cardiovascular;  Laterality: N/A;   COLONOSCOPY WITH PROPOFOL N/A 12/20/2020   Procedure: COLONOSCOPY WITH PROPOFOL;  Surgeon: Napoleon Form, MD;  Location: [facility] ENDOSCOPY;  Service: Endoscopy;  Laterality: N/A;   COLONOSCOPY WITH PROPOFOL N/A 12/15/2022   Procedure: COLONOSCOPY WITH PROPOFOL;  Surgeon: Sherrilyn Rist, MD;  Location: [facility] ENDOSCOPY;  Service: Gastroenterology;  Laterality: N/A;   CORONARY ARTERY BYPASS GRAFT N/A 06/05/2016   Procedure: CORONARY ARTERY BYPASS GRAFTING (CABG) x3 LIMA to LAD -SVG to OM -SVG to RCA;  Surgeon: Kerin Perna, MD;  Location: [facility] OR;  Service: Open Heart Surgery;  Laterality: N/A;  CORONARY STENT INTERVENTION N/A 07/07/2017   Procedure: CORONARY STENT INTERVENTION;  Surgeon: Iran Ouch, MD;  [facility]: [facility] LAB;  Service: Cardiovascular;  Laterality: N/A;   CORONARY STENT INTERVENTION N/A 03/30/2018   Procedure: CORONARY STENT INTERVENTION;  Surgeon: Swaziland, Peter M, MD;  Location: [facility] INVASIVE CV LAB;  Service: Cardiovascular;  Laterality: N/A;   ESOPHAGOGASTRODUODENOSCOPY (EGD) WITH PROPOFOL N/A 12/19/2020   Procedure: ESOPHAGOGASTRODUODENOSCOPY (EGD) WITH PROPOFOL;  Surgeon: Meryl Dare, MD;  Location: [facility] ENDOSCOPY;  Service: Endoscopy;  Laterality:  N/A;   ESOPHAGOGASTRODUODENOSCOPY (EGD) WITH PROPOFOL N/A 12/15/2022   Procedure: ESOPHAGOGASTRODUODENOSCOPY (EGD) WITH PROPOFOL;  Surgeon: Sherrilyn Rist, MD;  Location: [facility] ENDOSCOPY;  Service: Gastroenterology;  Laterality: N/A;   EXCHANGE OF A DIALYSIS CATHETER Right 04/16/2022   Procedure: EXCHANGE OF A DIALYSIS CATHETER;  Surgeon: Leonie Douglas, MD;  Location: [facility] OR;  Service: Vascular;  Laterality: Right;   GIVENS CAPSULE STUDY N/A 12/20/2020   Procedure: GIVENS CAPSULE STUDY;  Surgeon: Napoleon Form, MD;  Location: [facility] ENDOSCOPY;  Service: Endoscopy;  Laterality: N/A;   HEMOSTASIS CLIP PLACEMENT  12/15/2022   Procedure: HEMOSTASIS CLIP PLACEMENT;  Surgeon: Sherrilyn Rist, MD;  Location: [facility] ENDOSCOPY;  Service: Gastroenterology;;   INSERTION OF DIALYSIS CATHETER N/A 06/05/2016   Procedure: INSERTION OF DIALYSIS/trialysis CATHETER;  Surgeon: Kerin Perna, MD;  Location: [facility] OR;  Service: Vascular;  Laterality: N/A;   INSERTION OF DIALYSIS CATHETER Right 06/13/2016   Procedure: INSERTION OF DIALYSIS CATHETER RIGHT INTERNAL JUGULAR;  Surgeon: Fransisco Hertz, MD;  Location: [facility] OR;  Service: Vascular;  Laterality: Right;   INSERTION OF DIALYSIS CATHETER Right 09/07/2019   Procedure: Insertion Of Dialysis Catheter;  Surgeon: Larina Earthly, MD;  Location: [facility] OR;  Service: Vascular;  Laterality: Right;   INSERTION OF DIALYSIS CATHETER N/A 04/09/2022   Procedure: INSERTION OF TUNNELED DIALYSIS CATHETER;  Surgeon: Leonie Douglas, MD;  Location: [facility] OR;  Service: Vascular;  Laterality: N/A;   INTRAOPERATIVE TRANSESOPHAGEAL ECHOCARDIOGRAM N/A 06/05/2016   Procedure: INTRAOPERATIVE TRANSESOPHAGEAL ECHOCARDIOGRAM;  Surgeon: Kerin Perna, MD;  Location: [facility] OR;  Service: Open Heart Surgery;  Laterality: N/A;   LEFT HEART CATH AND CORS/GRAFTS ANGIOGRAPHY N/A 11/03/2016   Procedure: Left Heart Cath and Cors/Grafts Angiography;  Surgeon: Lennette Bihari, MD;  [facility]: [facility];  Service:  Cardiovascular;  Laterality: N/A;   LEFT HEART CATH AND CORS/GRAFTS ANGIOGRAPHY N/A 07/07/2017   Procedure: LEFT HEART CATH AND CORS/GRAFTS ANGIOGRAPHY;  Surgeon: Laurey Morale, MD;  Location: [facility] INVASIVE CV LAB;  Service: Cardiovascular;  Laterality: N/A;   LEFT HEART CATH AND CORS/GRAFTS ANGIOGRAPHY N/A 03/30/2018   Procedure: LEFT HEART CATH AND CORS/GRAFTS ANGIOGRAPHY;  Surgeon: Swazil[place], Peter M, MD;  Location: [facility] INVASIVE CV LAB;  Service: Cardiovascular;  Laterality: N/A;   LEFT HEART CATH AND CORS/GRAFTS ANGIOGRAPHY N/A 04/17/2019   Procedure: LEFT HEART CATH AND CORS/GRAFTS ANGIOGRAPHY;  Surgeon: Laurey Morale, MD;  Location: [facility] INVASIVE CV LAB;  Service: Cardiovascular;  Laterality: N/A;   LUMBAR LAMINECTOMY/DECOMPRESSION MICRODISCECTOMY Bilateral 09/01/2022   Procedure: Laminectomy and Foraminotomy - bilateral - Lumbar three-Lumbar four;  Surgeon: Julio Sicks, MD;  Location: [facility] OR;  Service: Neurosurgery;  Laterality: Bilateral;   PERIPHERAL VASCULAR BALLOON ANGIOPLASTY Left 06/02/2022   Procedure: PERIPHERAL VASCULAR BALLOON ANGIOPLASTY;  Surgeon: Nada Libman, MD;  [facility]: [facility];  Service: Cardiovascular;  Laterality: Left;   PERIPHERAL VASCULAR INTERVENTION Left 03/06/2021   Procedure: PERIPHERAL VASCULAR  INTERVENTION;  Surgeon: Cephus Shelling, MD;  Location: [facility] INVASIVE CV LAB;  Service: Cardiovascular;  Laterality: Left;   POLYPECTOMY  12/20/2020   Procedure: POLYPECTOMY;  Surgeon: Napoleon Form, MD;  Location: [facility] ENDOSCOPY;  Service: Endoscopy;;   POLYPECTOMY  12/15/2022   Procedure: POLYPECTOMY;  Surgeon: Sherrilyn Rist, MD;  Location: [facility] ENDOSCOPY;  Service: Gastroenterology;;   REVISION OF ARTERIOVENOUS GORETEX GRAFT Left 07/27/2019   Procedure: PLICATION OF ANEURYSM OF ARTERIOVENOUS FISTULA  LEFT ARM;  Surgeon: Larina Earthly, MD;  Location: [facility] OR;  Service: Vascular;  Laterality: Left;   REVISON OF ARTERIOVENOUS FISTULA Left 09/07/2019    Procedure: REVISON OF LEFT UPPER ARM ARTERIOVENOUS FISTULA;  Surgeon: Larina Earthly, MD;  Location: [facility] OR;  Service: Vascular;  Laterality: Left;   REVISON OF ARTERIOVENOUS FISTULA Left 04/09/2022   Procedure: LEFT ARM FISTULA REVISION;  Surgeon: Leonie Douglas, MD;  Location: [facility] OR;  Service: Vascular;  Laterality: Left;  PERIPHERAL NERVE BLOCK   REVISON OF ARTERIOVENOUS FISTULA Left 04/16/2022   Procedure: REVISON OF ARTERIOVENOUS FISTULA;  Surgeon: Leonie Douglas, MD;  Location: [facility] OR;  Service: Vascular;  Laterality: Left;   RIGHT/LEFT HEART CATH AND CORONARY ANGIOGRAPHY N/A 01/03/2021   Procedure: RIGHT/LEFT HEART CATH AND CORONARY ANGIOGRAPHY;  Surgeon: Laurey Morale, MD;  Location: [facility] INVASIVE CV LAB;  Service: Cardiovascular;  Laterality: N/A;   Patient Active Problem List   Diagnosis Date Noted   Gastric intestinal metaplasia 12/15/2022   Benign neoplasm of ascending colon 12/15/2022   Benign neoplasm of ileocecal valve 12/15/2022   Porokeratosis 11/10/2022   Depression, recurrent 10/06/2022   Lumbar stenosis with neurogenic claudication 09/01/2022   Chronic arthritis 08/11/2022   Tinea pedis 06/09/2022   Orthostatic hypotension 04/15/2022   Elevated troponin 04/15/2022   Complication associated with dialysis catheter 04/15/2022   Complication of AV dialysis fistula 04/15/2022   ESRD (end stage renal disease) 04/09/2022   Pulmonary emphysema 02/21/2022   Acute on chronic anemia 02/21/2022   Type 2 diabetes mellitus with hyperglycemia, with long-term current use of insulin 10/28/2021   Type 2 diabetes mellitus with diabetic polyneuropathy, with long-term current use of insulin 10/28/2021   Hypertension associated with diabetes 09/16/2021   Chronic bilateral low back pain with bilateral sciatica 09/16/2021   Partial seizure disorder    Acute respiratory failure with hypoxia 07/28/2021   Prolonged QT interval 01/01/2021   Hemoptysis 01/01/2021   Positive fecal occult blood  test    Symptomatic bradycardia 12/15/2020   Postural dizziness with presyncope 12/15/2020   Blood in stool 12/15/2020   Acute metabolic encephalopathy 08/29/2019   Altered mental status 08/27/2019   Unresponsive episode 08/27/2019   AF (paroxysmal atrial fibrillation) 08/27/2019   Status post coronary artery stent placement    NSTEMI (non-ST elevated myocardial infarction) 03/30/2018   Hypertensive heart disease 09/12/2017   Hyperlipidemia associated with type 2 diabetes mellitus 09/12/2017   Hyperkalemia 07/20/2017   Chronic diastolic CHF (congestive heart failure) 04/14/2017   Hypertensive emergency 04/14/2017   Anemia, chronic renal failure 04/11/2017   Fluid overload 04/11/2017   Diabetic foot ulcer 04/11/2017   Cellulitis in diabetic foot 01/18/2017   Chest pain 12/16/2016   Hypertensive urgency 12/16/2016   Pressure injury of skin 11/03/2016   Type 2 diabetes mellitus with chronic kidney disease on chronic dialysis, with long-term current use of insulin 10/31/2016   ESRD (end stage renal disease) on dialysis 10/30/2016   Atherosclerotic heart disease of native coronary  artery with other forms of angina pectoris with elevated troponin     Gastroesophageal reflux disease 08/18/2016   HCAP (healthcare-associated pneumonia)    Non-ST elevation (NSTEMI) myocardial infarction    Pronation deformity of both feet 02/27/2015   Metatarsal deformity 02/27/2015   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   Ischemic rest pain of lower extremity (HCC) 02/06/2015    PCP: Sharlene Dory, DO   REFERRING PROVIDER: Julio Sicks, MD   REFERRING DIAG: [phone] (ICD-10-CM) - Spinal stenosis, lumbar region with neurogenic claudication   Rationale for Evaluation and Treatment: Rehabilitation  THERAPY DIAG:  Radiculopathy, lumbar region  Cramp and spasm  Muscle weakness (generalized)  ONSET DATE: 3 weeks  SUBJECTIVE:                                                                                                                                                                                            SUBJECTIVE STATEMENT: Patient reports that his strength in the legs and back is getting better. Pain is not getting worse, but still pretty bad. Some improvement.   [place] Interpreter: Brantley Stage   PERTINENT HISTORY:  T2DM, CKD on dialysis, CHF, L3/4 discectomy  PAIN:  Are you having pain? Yes: NPRS scale: 5/10 Pain location: low back and bil LE to feet Pain description: pain in back, electricity in legs Aggravating factors: everything Relieving factors: nothing  PRECAUTIONS: None  WEIGHT BEARING RESTRICTIONS: No  FALLS:  Has patient fallen in last 6 months? No  LIVING ENVIRONMENT: Lives with: lives with their family Lives in: House/apartment Stairs: Yes: Internal: 13 steps; on left going up and External: 5 steps; on left going up Has following equipment at home: Single point cane, Walker - 2 wheeled, and Environmental consultant - 4 wheeled  OCCUPATION: retired  PLOF: Independent with household mobility with device  PATIENT GOALS: get rid of pain  NEXT MD VISIT: 2 weeks now  OBJECTIVE:   DIAGNOSTIC FINDINGS:  MRI 10/26/22 IMPRESSION: 1. Interval posterior decompression and discectomy at L3-4. There is progressive loss of disc height with probable intradiscal vacuum phenomenon and a broad-based central and right-sided disc protrusion which appears slightly increased from the preoperative study. There is progressive mass effect on the thecal sac which is incompletely decompressed posteriorly. Right foraminal narrowing appears slightly worse with probable right L3 nerve root encroachment. 2. No other significant changes are identified. Multilevel spondylosis superimposed on congenitally short pedicles with resulting chronic multifactorial spinal stenosis from L1-2 through L4-5. 3. Reactive endplate edema at U1-3 without evidence  of discitis/osteomyelitis.  PATIENT SURVEYS:  Modified Oswestry  40 / 50 = 80.0 %   SCREENING  FOR RED FLAGS: Bowel or bladder incontinence: No Spinal tumors: No Cauda equina syndrome: No Compression fracture: No Abdominal aneurysm: No  COGNITION: Overall cognitive status: Within functional limits for tasks assessed     SENSATION: WFL  MUSCLE LENGTH: HS: tight bil L> R Piriformis: bil L> R Hip Flexors: NT, but likely tight due to posture in standing Heelcords: WFL   POSTURE: rounded shoulders, forward head, decreased lumbar lordosis, increased thoracic kyphosis, and posterior pelvic tilt  PALPATION: Palpation: TTP at Bil gluteals L >R, bil lumbar.  Spinal mobility NT due to patient's pain level.  LUMBAR ROM:   AROM eval 01/04/23  Flexion Hands to knees Hands to mid shins  Extension Unable to get to neutral - bends knees to extend backward Painful when approaches neutral spine  Right lateral flexion 80% limited WFL  Left lateral flexion 90% limited WFL  Right rotation 75% limited 50% limited pain   Left rotation 80% limited 25% limited    (Blank rows = not tested)  LOWER EXTREMITY ROM:   WFL for MMT, hip extension not tested but decreased based on patient's inability to stand in neutral.  LOWER EXTREMITY MMT:    MMT Right eval Left eval Right  Left   Hip flexion 4 4- 4 4+  Hip extension 4-* 4-* 4+ 4+  Hip abduction 5 4+ 5 4+  Hip adduction 4 4- 5 4  Hip internal rotation      Hip external rotation      Knee flexion 5 4+ 5 4+  Knee extension 4+ 4 4+ 4+  Ankle dorsiflexion 5 5 5  4+   (Blank rows = not tested)   FUNCTIONAL TESTS:  30 seconds chair stand test: 12/09/22: score is 0 - able to do 3x w/o hands but used hands 1x; 01/07/23  12 reps no hands  GAIT: Distance walked: 40 Assistive device utilized: Walker - 4 wheeled Level of assistance: Complete Independence Comments: flexed trunk with walker too far ahead of him  TODAY'S TREATMENT:                                                                                                                               DATE:  01/04/23 Therapeutic Exercise: to improve flexibility, strength and mobility.  Verbal and tactile cues throughout for technique Nustep L5 x 7 min Lat pull 20# x 20 Rows 25# 3x10 - cues to keep shoulders down and retract S/L  hip ADD with ball 10x both S/L  clamshells 10x RTB S/L reverse clamshells 15x both S/L  ABD x 15 RTB  12/30/22 Therapeutic Exercise: to improve flexibility, strength and mobility.  Verbal and tactile cues throughout for technique Nustep L5 x 7 min Rows 25# 3x10 - cues to keep shoulders down and retract Standing lumbar extension standing against wall x 10 - cues to avoid pain S/L L hip ADD with ball 10x both S/L L clamshells 10x both  S/L reverse clamshells 10x  both  12/28/22 Therapeutic Exercise: to improve flexibility, strength and mobility.  Verbal and tactile cues throughout for technique Nustep L5 x 6 min 30 sec chair test - 12 reps Standing hip abduction 2x10 bil Standing HS curls x 7 bil Seated forward flexion and either side on green ball x 5 Standing marching x 10 bil Seated LAQ 2# 2x10 bil Seated march 2# 2x10 bil Lat pulls 20# 2x 10  Rows 15# 2x10  STM to bil lumbr paraspinals in sitting; MFR with ball to paraspinals and gluteals   12/23/22 Therapeutic Exercise: to improve flexibility, strength and mobility.  Verbal and tactile cues throughout for technique Nustep L5 x 6 min Standing hip abduction 2x10 bil Standing HS curls x 10 bil Standing marching x 10 bil Seated LAQ 2# 2x10 bil Seated march 2# 2x10 bil Lat pulls 20# x 10  Rows 15# 2x10  STM to bil lumbr paraspinals in sitting  12/21/22 Therapeutic Exercise: to improve flexibility, strength and mobility.  Verbal and tactile cues throughout for technique Nustep L4 x 6 min Seated hip flexor stretch 3x30 sec bil Standing: hipABD and ext x 10 ea B (ext painful), marching x  6 ea Supine with lower legs on peanut: glute/HS sets 5 sec hold x 10 Hooklying ab set pushing peanut into thighs 5 sec hold x 10 S/L hip ABD x 10 B S/L clam x 10 B Supine clam with YTB x 10, L only x 10 Sit to stand x 5 hands to hands on knees to no hands   PATIENT EDUCATION:  Education details: HEP update  Person educated: Patient Education method: Explanation, Demonstration, Tactile cues, Verbal cues, and Handouts Education comprehension: verbalized understanding, returned demonstration, verbal cues required, and needs further education  HOME EXERCISE PROGRAM: Access Code: [email or web address] URL: [email or web address] Date: 12/30/2022 Prepared by: Verta Ellen  Exercises - Seated Hamstring Stretch with Chair  - 2 x daily - 7 x weekly - 1 sets - 3 reps - 20-30 sec hold - Seated Figure 4 Piriformis Stretch  - 2 x daily - 7 x weekly - 1 sets - 3 reps - 30-60 sec hold - Seated Trunk Rotation  - 1 x daily - 7 x weekly - 1 sets - 10 reps - 10 sec hold - Seated Thoracic Extension Arms Overhead  - 1 x daily - 7 x weekly - 2 sets - 10 reps - Seated Pelvic Tilt  - 1 x daily - 7 x weekly - 2 sets - 10 reps - Seated Diagonal Chops with Medicine Ball  - 1 x daily - 7 x weekly - 2 sets - 10 reps - Seated Shoulder Horizontal Abduction with Resistance - Palms Down  - 1 x daily - 7 x weekly - 2 sets - 10 reps - Seated Hip Flexor Stretch  - 2 x daily - 7 x weekly - 1 sets - 3 reps - 30-60 sec hold - Abdominal Press into [place]  - 1 x daily - 7 x weekly - 2 sets - 10 reps - 5 sec hold - Clamshell  - 1 x daily - 7 x weekly - 1-3 sets - 10 reps - Sidelying Reverse Clamshell  - 1 x daily - 7 x weekly - 1-3 sets - 10 reps - Sidelying Hip Adduction Isometric with Ball  - 1 x daily - 7 x weekly - 1-3 sets - 10 reps - 5 sec hold hold  ASSESSMENT:  CLINICAL IMPRESSION: Saber is progressing well toward LTGs demonstrating improvements  in both strength and ROM. Pain is still his biggest limiting factor.  Sleeping is still the biggest limitation reporting that he gets only about 2 hours consecutively. He would benefit from [facility] with ball or roller and we touched on this today. He would benefit from further education here next visit.  Samir continues to demonstrate potential for improvement and would benefit from continued skilled therapy to address impairments.    OBJECTIVE IMPAIRMENTS: decreased activity tolerance, difficulty walking, decreased ROM, decreased strength, increased muscle spasms, impaired flexibility, postural dysfunction, and pain.   ACTIVITY LIMITATIONS: carrying, lifting, bending, sitting, standing, squatting, sleeping, stairs, transfers, bed mobility, hygiene/grooming, and locomotion level  PARTICIPATION LIMITATIONS:  all ADLs limited  PERSONAL FACTORS: Time since onset of injury/illness/exacerbation and 3+ comorbidities: T2DM, CKD on dialysis, CHF, L3/4 discectomy  are also affecting patient's functional outcome.   REHAB POTENTIAL: Good  CLINICAL DECISION MAKING: Evolving/moderate complexity  EVALUATION COMPLEXITY: Moderate   GOALS: Goals reviewed with patient? Yes  SHORT TERM GOALS: Target date: 01/04/2023   Patient will be independent with initial HEP.  Baseline:  Goal status: MET  2.  Patient will report decreased pain by 25%.  Baseline:  Goal status: IN PROGRESS - 12/28/22 20%  LONG TERM GOALS: Target date: 02/01/2023    Patient will be independent with advanced/ongoing HEP to improve outcomes and carryover.  Baseline:  Goal status: IN PROGRESS  2.  Patient will report 50-75% improvement in low back and leg pain to improve QOL.  Baseline:  Goal status: IN PROGRESS  3.  Patient will demonstrate functional lumbar ROM to perform ADLs.   Baseline:  Goal status: IN PROGRESS  4.  Patient will demonstrate improved functional strength by increased 30 second chair stand test by 5. Baseline: 0 Goal status: MET 12 reps  5.  Patient will report 34 on modified  oswestry to demonstrate improved functional ability.  Baseline:  40 / 50 = 80.0 % Goal status: IN PROGRESS   6.  Patient will report ability to sleep a minimum of 5 hours without waking from pain. Baseline:  Goal status: IN PROGRESS     PLAN:  PT FREQUENCY: 2x/week  PT DURATION: 8 weeks  PLANNED INTERVENTIONS: Therapeutic exercises, Therapeutic activity, Neuromuscular re-education, Balance training, Gait training, Patient/Family education, Self Care, Joint mobilization, Aquatic Therapy, Dry Needling, Electrical stimulation, Spinal mobilization, Cryotherapy, Moist heat, scar mobilization, Taping, Ultrasound, Ionotophoresis 4mg /ml Dexamethasone, and Manual therapy.  PLAN FOR NEXT SESSION:  work on MFR with roller and/or ball, LE flexibility and strength.  MT for pain.     Caysen Whang, PT 01/04/2023, 4:26 PM

## 2023-01-04 ENCOUNTER — Ambulatory Visit: Payer: 59 | Admitting: Physical Therapy

## 2023-01-04 ENCOUNTER — Encounter: Payer: Self-pay | Admitting: Physical Therapy

## 2023-01-04 DIAGNOSIS — M5416 Radiculopathy, lumbar region: Secondary | ICD-10-CM

## 2023-01-04 DIAGNOSIS — M6281 Muscle weakness (generalized): Secondary | ICD-10-CM

## 2023-01-04 DIAGNOSIS — R252 Cramp and spasm: Secondary | ICD-10-CM

## 2023-01-05 ENCOUNTER — Ambulatory Visit: Payer: Medicare Other | Admitting: Neurology

## 2023-01-05 NOTE — Therapy (Signed)
OUTPATIENT PHYSICAL THERAPY TREATMENT   Patient Name: Kenneth [name] MRN: [id number] DOB:1955/11/22, 67 y.o., male Today's Date: 01/06/2023  END OF SESSION:  PT End of Session - 01/06/23 1351     Visit Number 9    Date for PT Re-Evaluation 02/01/23    Authorization Type [employer] MCR    Progress Note Due on Visit 10    PT Start Time 1350    PT Stop Time 1435    PT Time Calculation (min) 45 min    Activity Tolerance Patient tolerated treatment well    Behavior During Therapy [facility] for tasks assessed/performed                Past Medical History:  Diagnosis Date   Arthritis    hands   CAD (coronary artery disease) 10/30/2016   NSTEMI 9/17 with CABG x 3 (LIMA-LAD, SVG-OM, SVG-RCA).   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   type 1 DM   Dyspnea    with exertion   ESRD on hemodialysis    "MWF; [place]" (03/30/2018)   Gastroesophageal reflux disease 08/18/2016   Hyperlipidemia    Hypertension    Ischemic rest pain of lower extremity 02/06/2015   Keratoma 02/27/2015   Metatarsal deformity 02/27/2015   PAF (paroxysmal atrial fibrillation)    Pronation deformity of both feet 02/27/2015   Past Surgical History:  Procedure Laterality Date   A/V FISTULAGRAM Left 03/06/2021   Procedure: A/V FISTULAGRAM;  Surgeon: Cephus Shelling, MD;  [facility]: [facility] INVASIVE CV LAB;  Service: Cardiovascular;  Laterality: Left;   A/V FISTULAGRAM Left 06/02/2022   Procedure: A/V Fistulagram;  Surgeon: Nada Libman, MD;  Location: [facility] INVAS[facility] CV LAB;  Service: Cardiovascular;  Laterality: Left;   AV FISTULA PLACEMENT Left 06/22/2016   Procedure: ARTERIOVENOUS (AV) FISTULA CREATION LEFT UPPER ARM;  Surgeon: Larina Earthly, MD;  Location: [facility] OR;  Service: Vascular;  Laterality: Left;   BIOPSY  12/19/2020   Procedure: BIOPSY;  Surgeon: Meryl Dare, MD;  Location: [facility]  ENDOSCOPY;  Service: Endoscopy;;   BIOPSY  12/15/2022   Procedure: BIOPSY;  Surgeon: Sherrilyn Rist, MD;  Location: [facility] ENDOSCOPY;  Service: Gastroenterology;;   CARDIAC CATHETERIZATION N/A 05/31/2016   Procedure: Left Heart Cath and Coronary Angiography;  Surgeon: Corky Crafts, MD;  Location: [facility] INVASIVE CV LAB;  Service: Cardiovascular;  Laterality: N/A;   CARDIAC CATHETERIZATION N/A 05/31/2016   Procedure: Right Heart Cath;  Surgeon: Corky Crafts, MD;  Location: [facility] INVASIVE CV LAB;  Service: Cardiovascular;  Laterality: N/A;   CARDIAC CATHETERIZATION N/A 05/31/2016   Procedure: IABP Insertion;  Surgeon: Corky Crafts, MD;  [facility] INV[facility] CV LAB;  Service: Cardiovascular;  Laterality: N/A;   COLONOSCOPY WITH PROPOFOL N/A 12/20/2020   Procedure: COLONOSCOPY WITH PROPOFOL;  Surgeon: Napoleon Form, MD;  Location: [facility] ENDOSCOPY;  Service: Endoscopy;  Laterality: N/A;   COLONOSCOPY WITH PROPOFOL N/A 12/15/2022   Procedure: COLONOSCOPY WITH PROPOFOL;  Surgeon: Sherrilyn Rist, MD;  Location: [facility] ENDOSCOPY;  Service: Gastroenterology;  Laterality: N/A;   CORONARY ARTERY BYPASS GRAFT N/A 06/05/2016   Procedure: CORONARY ARTERY BYPASS GRAFTING (CABG) x3 LIMA to LAD -SVG to OM -SVG to RCA;  Surgeon: Kerin Perna, MD;  Location: [facility] OR;  Service: Open Heart Surgery;  Laterality: N/A;  CORONARY STENT INTERVENTION N/A 07/07/2017   Procedure: CORONARY STENT INTERVENTION;  Surgeon: Iran Ouch, MD;  [facility]VAS[facility] LAB;  Service: Cardiovascular;  Laterality: N/A;   CORONARY STENT INTERVENTION N/A 03/30/2018   Procedure: CORONARY STENT INTERVENTION;  Surgeon: Swaziland, Peter M, MD;  Location: [facility] INVASIVE CV LAB;  Service: Cardiovascular;  Laterality: N/A;   ESOPHAGOGASTRODUODENOSCOPY (EGD) WITH PROPOFOL N/A 12/19/2020   Procedure: ESOPHAGOGASTRODUODENOSCOPY (EGD) WITH PROPOFOL;  Surgeon: Meryl Dare, MD;  Location: [facility] ENDOSCOPY;  Service: Endoscopy;  Laterality:  N/A;   ESOPHAGOGASTRODUODENOSCOPY (EGD) WITH PROPOFOL N/A 12/15/2022   Procedure: ESOPHAGOGASTRODUODENOSCOPY (EGD) WITH PROPOFOL;  Surgeon: Sherrilyn Rist, MD;  Location: [facility] ENDOSCOPY;  Service: Gastroenterology;  Laterality: N/A;   EXCHANGE OF A DIALYSIS CATHETER Right 04/16/2022   Procedure: EXCHANGE OF A DIALYSIS CATHETER;  Surgeon: Leonie Douglas, MD;  Location: [facility] OR;  Service: Vascular;  Laterality: Right;   GIVENS CAPSULE STUDY N/A 12/20/2020   Procedure: GIVENS CAPSULE STUDY;  Surgeon: Napoleon Form, MD;  Location: [facility] ENDOSCOPY;  Service: Endoscopy;  Laterality: N/A;   HEMOSTASIS CLIP PLACEMENT  12/15/2022   Procedure: HEMOSTASIS CLIP PLACEMENT;  Surgeon: Sherrilyn Rist, MD;  Location: [facility] ENDOSCOPY;  Service: Gastroenterology;;   INSERTION OF DIALYSIS CATHETER N/A 06/05/2016   Procedure: INSERTION OF DIALYSIS/trialysis CATHETER;  Surgeon: Kerin Perna, MD;  Location: [facility] OR;  Service: Vascular;  Laterality: N/A;   INSERTION OF DIALYSIS CATHETER Right 06/13/2016   Procedure: INSERTION OF DIALYSIS CATHETER RIGHT INTERNAL JUGULAR;  Surgeon: Fransisco Hertz, MD;  Location: [facility] OR;  Service: Vascular;  Laterality: Right;   INSERTION OF DIALYSIS CATHETER Right 09/07/2019   Procedure: Insertion Of Dialysis Catheter;  Surgeon: Larina Earthly, MD;  Location: [facility] OR;  Service: Vascular;  Laterality: Right;   INSERTION OF DIALYSIS CATHETER N/A 04/09/2022   Procedure: INSERTION OF TUNNELED DIALYSIS CATHETER;  Surgeon: Leonie Douglas, MD;  Location: [facility] OR;  Service: Vascular;  Laterality: N/A;   INTRAOPERATIVE TRANSESOPHAGEAL ECHOCARDIOGRAM N/A 06/05/2016   Procedure: INTRAOPERATIVE TRANSESOPHAGEAL ECHOCARDIOGRAM;  Surgeon: Kerin Perna, MD;  Location: [facility] OR;  Service: Open Heart Surgery;  Laterality: N/A;   LEFT HEART CATH AND CORS/GRAFTS ANGIOGRAPHY N/A 11/03/2016   Procedure: Left Heart Cath and Cors/Grafts Angiography;  Surgeon: Lennette Bihari, MD;  [facility] CV LAB;  Service:  Cardiovascular;  Laterality: N/A;   LEFT HEART CATH AND CORS/GRAFTS ANGIOGRAPHY N/A 07/07/2017   Procedure: LEFT HEART CATH AND CORS/GRAFTS ANGIOGRAPHY;  Surgeon: Laurey Morale, MD;  Location: [facility] INVASIVE CV LAB;  Service: Cardiovascular;  Laterality: N/A;   LEFT HEART CATH AND CORS/GRAFTS ANGIOGRAPHY N/A 03/30/2018   Procedure: LEFT HEART CATH AND CORS/GRAFTS ANGIOGRAPHY;  Surgeon: [place]azil[place], Peter M, MD;  Location: [facility] INVASIVE CV LAB;  Service: Cardiovascular;  Laterality: N/A;   LEFT HEART CATH AND CORS/GRAFTS ANGIOGRAPHY N/A 04/17/2019   Procedure: LEFT HEART CATH AND CORS/GRAFTS ANGIOGRAPHY;  Surgeon: Laurey Morale, MD;  Location: [facility] INVASIVE CV LAB;  Service: Cardiovascular;  Laterality: N/A;   LUMBAR LAMINECTOMY/DECOMPRESSION MICRODISCECTOMY Bilateral 09/01/2022   Procedure: Laminectomy and Foraminotomy - bilateral - Lumbar three-Lumbar four;  Surgeon: Julio Sicks, MD;  Location: [facility] OR;  Service: Neurosurgery;  Laterality: Bilateral;   PERIPHERAL VASCULAR BALLOON ANGIOPLASTY Left 06/02/2022   Procedure: PERIPHERAL VASCULAR BALLOON ANGIOPLASTY;  Surgeon: Nada Libman, MD;  [facility];  Service: Cardiovascular;  Laterality: Left;   PERIPHERAL VASCULAR INTERVENTION Left 03/06/2021   Procedure: PERIPHERAL VASCULAR  INTERVENTION;  Surgeon: Cephus Shelling, MD;  Location: [facility] INVASIVE CV LAB;  Service: Cardiovascular;  Laterality: Left;   POLYPECTOMY  12/20/2020   Procedure: POLYPECTOMY;  Surgeon: Napoleon Form, MD;  Location[facility] ENDOSCOPY;  Service: Endoscopy;;   POLYPECTOMY  12/15/2022   Procedure: POLYPECTOMY;  Surgeon: Sherrilyn Rist, MD;  Location: [facility] ENDOSCOPY;  Service: Gastroenterology;;   REVISION OF ARTERIOVENOUS GORETEX GRAFT Left 07/27/2019   Procedure: PLICATION OF ANEURYSM OF ARTERIOVENOUS FISTULA  LEFT ARM;  Surgeon: Larina Earthly, MD;  Location: [facility] OR;  Service: Vascular;  Laterality: Left;   REVISON OF ARTERIOVENOUS FISTULA Left 09/07/2019    Procedure: REVISON OF LEFT UPPER ARM ARTERIOVENOUS FISTULA;  Surgeon: Larina Earthly, MD;  Location: [facility] OR;  Service: Vascular;  Laterality: Left;   REVISON OF ARTERIOVENOUS FISTULA Left 04/09/2022   Procedure: LEFT ARM FISTULA REVISION;  Surgeon: Leonie Douglas, MD;  Location: [facility] OR;  Service: Vascular;  Laterality: Left;  PERIPHERAL NERVE BLOCK   REVISON OF ARTERIOVENOUS FISTULA Left 04/16/2022   Procedure: REVISON OF ARTERIOVENOUS FISTULA;  Surgeon: Leonie Douglas, MD;  Location: [facility] OR;  Service: Vascular;  Laterality: Left;   RIGHT/LEFT HEART CATH AND CORONARY ANGIOGRAPHY N/A 01/03/2021   Procedure: RIGHT/LEFT HEART CATH AND CORONARY ANGIOGRAPHY;  Surgeon: Laurey Morale, MD;  Location: [facility] INVASIVE CV LAB;  Service: Cardiovascular;  Laterality: N/A;   Patient Active Problem List   Diagnosis Date Noted   Gastric intestinal metaplasia 12/15/2022   Benign neoplasm of ascending colon 12/15/2022   Benign neoplasm of ileocecal valve 12/15/2022   Porokeratosis 11/10/2022   Depression, recurrent 10/06/2022   Lumbar stenosis with neurogenic claudication 09/01/2022   Chronic arthritis 08/11/2022   Tinea pedis 06/09/2022   Orthostatic hypotension 04/15/2022   Elevated troponin 04/15/2022   Complication associated with dialysis catheter 04/15/2022   Complication of AV dialysis fistula 04/15/2022   ESRD (end stage renal disease) 04/09/2022   Pulmonary emphysema 02/21/2022   Acute on chronic anemia 02/21/2022   Type 2 diabetes mellitus with hyperglycemia, with long-term current use of insulin 10/28/2021   Type 2 diabetes mellitus with diabetic polyneuropathy, with long-term current use of insulin 10/28/2021   Hypertension associated with diabetes 09/16/2021   Chronic bilateral low back pain with bilateral sciatica 09/16/2021   Partial seizure disorder    Acute respiratory failure with hypoxia 07/28/2021   Prolonged QT interval 01/01/2021   Hemoptysis 01/01/2021   Positive fecal occult blood  test    Symptomatic bradycardia 12/15/2020   Postural dizziness with presyncope 12/15/2020   Blood in stool 12/15/2020   Acute metabolic encephalopathy 08/29/2019   Altered mental status 08/27/2019   Unresponsive episode 08/27/2019   AF (paroxysmal atrial fibrillation) 08/27/2019   Status post coronary artery stent placement    NSTEMI (non-ST elevated myocardial infarction) 03/30/2018   Hypertensive heart disease 09/12/2017   Hyperlipidemia associated with type 2 diabetes mellitus 09/12/2017   Hyperkalemia 07/20/2017   Chronic diastolic CHF (congestive heart failure) 04/14/2017   Hypertensive emergency 04/14/2017   Anemia, chronic renal failure 04/11/2017   Fluid overload 04/11/2017   Diabetic foot ulcer 04/11/2017   Cellulitis in diabetic foot 01/18/2017   Chest pain 12/16/2016   Hypertensive urgency 12/16/2016   Pressure injury of skin 11/03/2016   Type 2 diabetes mellitus with chronic kidney disease on chronic dialysis, with long-term current use of insulin 10/31/2016   ESRD (end stage renal disease) on dialysis 10/30/2016   Atherosclerotic heart disease of native coronary  artery with other forms of angina pectoris with elevated troponin     Gastroesophageal reflux disease 08/18/2016   HCAP (healthcare-associated pneumonia)    Non-ST elevation (NSTEMI) myocardial infarction    Pronation deformity of both feet 02/27/2015   Metatarsal deformity 02/27/2015   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   Ischemic rest pain of lower extremity (HCC) 02/06/2015    PCP: Sharlene Dory, DO   REFERRING PROVIDER: Sharlene Dory*   REFERRING DIAG: [phone] (ICD-10-CM) - Spinal stenosis, lumbar region with neurogenic claudication   Rationale for Evaluation and Treatment: Rehabilitation  THERAPY DIAG:  Radiculopathy, lumbar region  Cramp and spasm  Muscle weakness (generalized)  ONSET DATE: 3 weeks  SUBJECTIVE:                                                                                                                                                                                            SUBJECTIVE STATEMENT: No better, no worse  [place] Interpreter: Brantley Stage   PERTINENT HISTORY:  T2DM, CKD on dialysis, CHF, L3/4 discectomy  PAIN:  Are you having pain? Yes: NPRS scale: 5/10 Pain location: low back and bil LE to feet Pain description: pain in back, electricity in legs Aggravating factors: everything Relieving factors: nothing  PRECAUTIONS: None  WEIGHT BEARING RESTRICTIONS: No  FALLS:  Has patient fallen in last 6 months? No  LIVING ENVIRONMENT: Lives with: lives with their family Lives in: House/apartment Stairs: Yes: Internal: 13 steps; on left going up and External: 5 steps; on left going up Has following equipment at home: Single point cane, Walker - 2 wheeled, and Environmental consultant - 4 wheeled  OCCUPATION: retired  PLOF: Independent with household mobility with device  PATIENT GOALS: get rid of pain  NEXT MD VISIT: 2 weeks now  OBJECTIVE:   DIAGNOSTIC FINDINGS:  MRI 10/26/22 IMPRESSION: 1. Interval posterior decompression and discectomy at L3-4. There is progressive loss of disc height with probable intradiscal vacuum phenomenon and a broad-based central and right-sided disc protrusion which appears slightly increased from the preoperative study. There is progressive mass effect on the thecal sac which is incompletely decompressed posteriorly. Right foraminal narrowing appears slightly worse with probable right L3 nerve root encroachment. 2. No other significant changes are identified. Multilevel spondylosis superimposed on congenitally short pedicles with resulting chronic multifactorial spinal stenosis from L1-2 through L4-5. 3. Reactive endplate edema at J4-7 without evidence of discitis/osteomyelitis.  PATIENT SURVEYS:  Modified Oswestry  40 / 50 = 80.0 %   SCREENING FOR RED FLAGS: Bowel or bladder  incontinence: No Spinal tumors: No Cauda equina syndrome: No Compression fracture: No Abdominal aneurysm: No  COGNITION: Overall cognitive status: Within functional limits for tasks assessed     SENSATION: WFL  MUSCLE LENGTH: HS: tight bil L> R Piriformis: bil L> R Hip Flexors: NT, but likely tight due to posture in standing Heelcords: WFL   POSTURE: rounded shoulders, forward head, decreased lumbar lordosis, increased thoracic kyphosis, and posterior pelvic tilt  PALPATION: Palpation: TTP at Bil gluteals L >R, bil lumbar.  Spinal mobility NT due to patient's pain level.  LUMBAR ROM:   AROM eval 01/04/23  Flexion Hands to knees Hands to mid shins  Extension Unable to get to neutral - bends knees to extend backward Painful when approaches neutral spine  Right lateral flexion 80% limited WFL  Left lateral flexion 90% limited WFL  Right rotation 75% limited 50% limited pain   Left rotation 80% limited 25% limited    (Blank rows = not tested)  LOWER EXTREMITY ROM:   WFL for MMT, hip extension not tested but decreased based on patient's inability to stand in neutral.  LOWER EXTREMITY MMT:    MMT Right eval Left eval Right  Left   Hip flexion 4 4- 4 4+  Hip extension 4-* 4-* 4+ 4+  Hip abduction 5 4+ 5 4+  Hip adduction 4 4- 5 4  Hip internal rotation      Hip external rotation      Knee flexion 5 4+ 5 4+  Knee extension 4+ 4 4+ 4+  Ankle dorsiflexion 5 5 5  4+   (Blank rows = not tested)   FUNCTIONAL TESTS:  30 seconds chair stand test: 12/09/22: score is 0 - able to do 3x w/o hands but used hands 1x; 01/07/23  12 reps no hands  GAIT: Distance walked: 40 Assistive device utilized: Walker - 4 wheeled Level of assistance: Complete Independence Comments: flexed trunk with walker too far ahead of him  TODAY'S TREATMENT:                                                                                                                              DATE:   01/06/23 Therapeutic Exercise: to improve flexibility, strength and mobility.  Verbal and tactile cues throughout for technique Nustep L5 x 7 min Lat pull 10# x 10, 20# x 20 Rows 20# x30 (2 x 15) - cues to keep shoulders down and retract (25# too much today) Seated trunk extension BTB x 10 S/L  hip ADD with ball 10x both S/L  clamshells 15x RTB both S/L reverse clamshells 15x both S/L  ABD x 15 RTB both  Modalities: TENS trial to lumbar spine x 10 min   01/04/23 Therapeutic Exercise: to improve flexibility, strength and mobility.  Verbal and tactile cues throughout for technique Nustep L5 x 7 min Lat pull 20# x 20 Rows 25# 3x10 - cues to keep shoulders down and retract S/L  hip ADD with ball 10x both S/L  clamshells 10x RTB S/L reverse clamshells 15x both S/L  ABD x 15 RTB  12/30/22 Therapeutic Exercise: to improve flexibility, strength and mobility.  Verbal and tactile cues throughout for technique Nustep L5 x 7 min Rows 25# 3x10 - cues to keep shoulders down and retract Standing lumbar extension standing against wall x 10 - cues to avoid pain S/L L hip ADD with ball 10x both S/L L clamshells 10x both  S/L reverse clamshells 10x both  12/28/22 Therapeutic Exercise: to improve flexibility, strength and mobility.  Verbal and tactile cues throughout for technique Nustep L5 x 6 min 30 sec chair test - 12 reps Standing hip abduction 2x10 bil Standing HS curls x 7 bil Seated forward flexion and either side on green ball x 5 Standing marching x 10 bil Seated LAQ 2# 2x10 bil Seated march 2# 2x10 bil Lat pulls 20# 2x 10  Rows 15# 2x10  STM to bil lumbr paraspinals in sitting; MFR with ball to paraspinals and gluteals  PATIENT EDUCATION:  Education details: HEP update  Person educated: Patient Education method: Explanation, Demonstration, Tactile cues, Verbal cues, and Handouts Education comprehension: verbalized understanding, returned demonstration, verbal cues required,  and needs further education  HOME EXERCISE PROGRAM: Access Code: [email or web address] URL: [email or web address] Date: 12/30/2022 Prepared by: Verta Ellen  Exercises - Seated Hamstring Stretch with Chair  - 2 x daily - 7 x weekly - 1 sets - 3 reps - 20-30 sec hold - Seated Figure 4 Piriformis Stretch  - 2 x daily - 7 x weekly - 1 sets - 3 reps - 30-60 sec hold - Seated Trunk Rotation  - 1 x daily - 7 x weekly - 1 sets - 10 reps - 10 sec hold - Seated Thoracic Extension Arms Overhead  - 1 x daily - 7 x weekly - 2 sets - 10 reps - Seated Pelvic Tilt  - 1 x daily - 7 x weekly - 2 sets - 10 reps - Seated Diagonal Chops with Medicine Ball  - 1 x daily - 7 x weekly - 2 sets - 10 reps - Seated Shoulder Horizontal Abduction with Resistance - Palms Down  - 1 x daily - 7 x weekly - 2 sets - 10 reps - Seated Hip Flexor Stretch  - 2 x daily - 7 x weekly - 1 sets - 3 reps - 30-60 sec hold - Abdominal Press into [place]  - 1 x daily - 7 x weekly - 2 sets - 10 reps - 5 sec hold - Clamshell  - 1 x daily - 7 x weekly - 1-3 sets - 10 reps - Sidelying Reverse Clamshell  - 1 x daily - 7 x weekly - 1-3 sets - 10 reps - Sidelying Hip Adduction Isometric with Ball  - 1 x daily - 7 x weekly - 1-3 sets - 10 reps - 5 sec hold hold  ASSESSMENT:  CLINICAL IMPRESSION: Jagdeep reports he is able to walk 30 min in the morning and 15 min at lunch outside with his wife following with rollator. Pain is what limits him. We did a trial of TENs unit as option for pain control at home with good response. TENs info issued to patient.    OBJECTIVE IMPAIRMENTS: decreased activity tolerance, difficulty walking, decreased ROM, decreased strength, increased muscle spasms, impaired flexibility, postural dysfunction, and pain.   ACTIVITY LIMITATIONS: carrying, lifting, bending, sitting, standing, squatting, sleeping, stairs, transfers, bed mobility, hygiene/grooming, and locomotion level  PARTICIPATION LIMITATIONS:  all ADLs  limited  PERSONAL FACTORS: Time since onset of injury/illness/exacerbation and  3+ comorbidities: T2DM, CKD on dialysis, CHF, L3/4 discectomy  are also affecting patient's functional outcome.   REHAB POTENTIAL: Good  CLINICAL DECISION MAKING: Evolving/moderate complexity  EVALUATION COMPLEXITY: Moderate   GOALS: Goals reviewed with patient? Yes  SHORT TERM GOALS: Target date: 01/04/2023   Patient will be independent with initial HEP.  Baseline:  Goal status: MET  2.  Patient will report decreased pain by 25%.  Baseline:  Goal status: IN PROGRESS - 12/28/22 20%  LONG TERM GOALS: Target date: 02/01/2023    Patient will be independent with advanced/ongoing HEP to improve outcomes and carryover.  Baseline:  Goal status: IN PROGRESS  2.  Patient will report 50-75% improvement in low back and leg pain to improve QOL.  Baseline:  Goal status: IN PROGRESS  3.  Patient will demonstrate functional lumbar ROM to perform ADLs.   Baseline:  Goal status: IN PROGRESS  4.  Patient will demonstrate improved functional strength by increased 30 second chair stand test by 5. Baseline: 0 Goal status: MET 12 reps  5.  Patient will report 34 on modified oswestry to demonstrate improved functional ability.  Baseline:  40 / 50 = 80.0 % Goal status: IN PROGRESS   6.  Patient will report ability to sleep a minimum of 5 hours without waking from pain. Baseline:  Goal status: IN PROGRESS     PLAN:  PT FREQUENCY: 2x/week  PT DURATION: 8 weeks  PLANNED INTERVENTIONS: Therapeutic exercises, Therapeutic activity, Neuromuscular re-education, Balance training, Gait training, Patient/Family education, Self Care, Joint mobilization, Aquatic Therapy, Dry Needling, Electrical stimulation, Spinal mobilization, Cryotherapy, Moist heat, scar mobilization, Taping, Ultrasound, Ionotophoresis /ml Dexamethasone, and Manual therapy.  PLAN FOR NEXT SESSION:  10th visit progress note due, work on [facility] with  roller and/or ball, LE flexibility and strength.  MT for pain.     Pat Sires, PT 01/06/2023, 2:42 PM

## 2023-01-06 ENCOUNTER — Ambulatory Visit: Payer: Medicare Other | Admitting: Neurology

## 2023-01-06 ENCOUNTER — Ambulatory Visit: Payer: 59 | Admitting: Physical Therapy

## 2023-01-06 ENCOUNTER — Encounter: Payer: Self-pay | Admitting: Physical Therapy

## 2023-01-06 DIAGNOSIS — M6281 Muscle weakness (generalized): Secondary | ICD-10-CM

## 2023-01-06 DIAGNOSIS — M5416 Radiculopathy, lumbar region: Secondary | ICD-10-CM

## 2023-01-06 DIAGNOSIS — R252 Cramp and spasm: Secondary | ICD-10-CM

## 2023-01-08 ENCOUNTER — Ambulatory Visit (INDEPENDENT_AMBULATORY_CARE_PROVIDER_SITE_OTHER): Payer: 59 | Admitting: Gastroenterology

## 2023-01-08 DIAGNOSIS — D500 Iron deficiency anemia secondary to blood loss (chronic): Secondary | ICD-10-CM | POA: Diagnosis not present

## 2023-01-08 NOTE — Patient Instructions (Signed)
You may have clear liquids beginning at 10:30 am after ingesting the capsule.    You can have a light lunch at 12:30 pm; sandwich and half bowl of soup.  Return to the office at 4 pm to return the equipment.   Return to you normal diet at 5 pm.   Call [phone] and ask for Shiya Fogelman, RN if you have any questions.  You should pass the capsule in your stool 8-48 hours after ingestion. If you have not passed the capsule, after 72 hours, please contact the office at [phone].    

## 2023-01-08 NOTE — Progress Notes (Signed)
SN: [id number] Exp: 2024-04-11 LOT: [id number]  Patient arrived for VCE. Reported the prep went well. This RN explained capsule dietary restrictions for the next few hours. Pt advised to return at 4 pm to return capsule equipment.  Patient verbalized understanding. Opened capsule, ensured capsule was flashing prior to the patient swallowing the capsule. Patient swallowed capsule without difficulty.  Patient told to call the office with any questions and if capsule has not passed after 72 hours. No further questions by the conclusion of the visit.

## 2023-01-10 NOTE — Therapy (Signed)
OUTPATIENT PHYSICAL THERAPY TREATMENT AND PROGRESS NOTE  Physical Therapy Progress Note  Dates of Reporting Period: 12/07/22 to 01/11/23    Patient Name: AVIGDOR DOLLAR MRN: [id number] DOB:Jan 01, 1956, 67 y.o., male Today's Date: 01/11/2023  END OF SESSION:  PT End of Session - 01/11/23 1350     Visit Number 10    Date for PT Re-Evaluation 02/01/23    Authorization Type [employer] MCR    Progress Note Due on Visit 20    PT Start Time 1347    PT Stop Time 1443    PT Time Calculation (min) 56 min    Activity Tolerance Patient tolerated treatment well    Behavior During Therapy [facility] for tasks assessed/performed                Past Medical History:  Diagnosis Date   Arthritis    hands   CAD (coronary artery disease) 10/30/2016   NSTEMI 9/17 with CABG x 3 (LIMA-LAD, SVG-OM, SVG-RCA).   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   type 1 DM   Dyspnea    with exertion   ESRD on hemodialysis (HCC)    "MWF; [place]" (03/30/2018)   Gastroesophageal reflux disease 08/18/2016   Hyperlipidemia    Hypertension    Ischemic rest pain of lower extremity 02/06/2015   Keratoma 02/27/2015   Metatarsal deformity 02/27/2015   PAF (paroxysmal atrial fibrillation) (HCC)    Pronation deformity of both feet 02/27/2015   Past Surgical History:  Procedure Laterality Date   A/V FISTULAGRAM Left 03/06/2021   Procedure: A/V FISTULAGRAM;  Surgeon: Cephus Shelling, MD;  Location: [facility] INVASIVE CV LAB;  Service: Cardiovascular;  Laterality: Left;   A/V FISTULAGRAM Left 06/02/2022   Procedure: A/V Fistulagram;  Surgeon: Nada Libman, MD;  Location: [facility];  Service: Cardiovascular;  Laterality: Left;   AV FISTULA PLACEMENT Left 06/22/2016   Procedure: ARTERIOVENOUS (AV) FISTULA CREATION LEFT UPPER ARM;  Surgeon: Larina Earthly, MD;  Location: [facility]  OR;  Service: Vascular;  Laterality: Left;   BIOPSY  12/19/2020   Procedure: BIOPSY;  Surgeon: Meryl Dare, MD;  Location: [facility] ENDOSCOPY;  Service: Endoscopy;;   BIOPSY  12/15/2022   Procedure: BIOPSY;  Surgeon: Sherrilyn Rist, MD;  Location: [facility] ENDOSCOPY;  Service: Gastroenterology;;   CARDIAC CATHETERIZATION N/A 05/31/2016   Procedure: Left Heart Cath and Coronary Angiography;  Surgeon: Corky Crafts, MD;  Location: [facility] INVASIVE CV LAB;  Service: Cardiovascular;  Laterality: N/A;   CARDIAC CATHETERIZATION N/A 05/31/2016   Procedure: Right Heart Cath;  Surgeon: Corky Crafts, MD;  Location: [facility] INVASIVE CV LAB;  Service: Cardiovascular;  Laterality: N/A;   CARDIAC CATHETERIZATION N/A 05/31/2016   Procedure: IABP Insertion;  Surgeon: Corky Crafts, MD;  [facility]: [facility] INVAS[facility] CV LAB;  Service: Cardiovascular;  Laterality: N/A;   COLONOSCOPY WITH PROPOFOL N/A 12/20/2020   Procedure: COLONOSCOPY WITH PROPOFOL;  Surgeon: Napoleon Form, MD;  Location: [facility] ENDOSCOPY;  Service: Endoscopy;  Laterality: N/A;   COLONOSCOPY WITH PROPOFOL N/A 12/15/2022   Procedure: COLONOSCOPY WITH PROPOFOL;  Surgeon: Sherrilyn Rist, MD;  Location: [facility] ENDOSCOPY;  Service: Gastroenterology;  Laterality: N/A;   CORONARY ARTERY BYPASS GRAFT N/A 06/05/2016   Procedure: CORONARY ARTERY BYPASS GRAFTING (CABG) x3 LIMA to LAD -SVG to OM -  SVG to RCA;  Surgeon: Kerin Perna, MD;  Location: [facility] OR;  Service: Open Heart Surgery;  Laterality: N/A;   CORONARY STENT INTERVENTION N/A 07/07/2017   Procedure: CORONARY STENT INTERVENTION;  Surgeon: Iran Ouch, MD;  [facility]VAS[facility] CV LAB;  Service: Cardiovascular;  Laterality: N/A;   CORONARY STENT INTERVENTION N/A 03/30/2018   Procedure: CORONARY STENT INTERVENTION;  Surgeon: Swazil[place], Peter M, MD;  Location: [facility] INVASIVE CV LAB;  Service: Cardiovascular;  Laterality: N/A;   ESOPHAGOGASTRODUODENOSCOPY (EGD) WITH PROPOFOL N/A 12/19/2020   Procedure:  ESOPHAGOGASTRODUODENOSCOPY (EGD) WITH PROPOFOL;  Surgeon: Meryl Dare, MD;  Location: [facility] ENDOSCOPY;  Service: Endoscopy;  Laterality: N/A;   ESOPHAGOGASTRODUODENOSCOPY (EGD) WITH PROPOFOL N/A 12/15/2022   Procedure: ESOPHAGOGASTRODUODENOSCOPY (EGD) WITH PROPOFOL;  Surgeon: Sherrilyn Rist, MD;  Location: [facility] ENDOSCOPY;  Service: Gastroenterology;  Laterality: N/A;   EXCHANGE OF A DIALYSIS CATHETER Right 04/16/2022   Procedure: EXCHANGE OF A DIALYSIS CATHETER;  Surgeon: Leonie Douglas, MD;  Location: [facility] OR;  Service: Vascular;  Laterality: Right;   GIVENS CAPSULE STUDY N/A 12/20/2020   Procedure: GIVENS CAPSULE STUDY;  Surgeon: Napoleon Form, MD;  Location: [facility] ENDOSCOPY;  Service: Endoscopy;  Laterality: N/A;   HEMOSTASIS CLIP PLACEMENT  12/15/2022   Procedure: HEMOSTASIS CLIP PLACEMENT;  Surgeon: Sherrilyn Rist, MD;  Location: [facility] ENDOSCOPY;  Service: Gastroenterology;;   INSERTION OF DIALYSIS CATHETER N/A 06/05/2016   Procedure: INSERTION OF DIALYSIS/trialysis CATHETER;  Surgeon: Kerin Perna, MD;  Location: [facility] OR;  Service: Vascular;  Laterality: N/A;   INSERTION OF DIALYSIS CATHETER Right 06/13/2016   Procedure: INSERTION OF DIALYSIS CATHETER RIGHT INTERNAL JUGULAR;  Surgeon: Fransisco Hertz, MD;  Location: [facility] OR;  Service: Vascular;  Laterality: Right;   INSERTION OF DIALYSIS CATHETER Right 09/07/2019   Procedure: Insertion Of Dialysis Catheter;  Surgeon: Larina Earthly, MD;  Location: [facility] OR;  Service: Vascular;  Laterality: Right;   INSERTION OF DIALYSIS CATHETER N/A 04/09/2022   Procedure: INSERTION OF TUNNELED DIALYSIS CATHETER;  Surgeon: Leonie Douglas, MD;  Location: [facility] OR;  Service: Vascular;  Laterality: N/A;   INTRAOPERATIVE TRANSESOPHAGEAL ECHOCARDIOGRAM N/A 06/05/2016   Procedure: INTRAOPERATIVE TRANSESOPHAGEAL ECHOCARDIOGRAM;  Surgeon: Kerin Perna, MD;  Location: [facility] OR;  Service: Open Heart Surgery;  Laterality: N/A;   LEFT HEART CATH AND CORS/GRAFTS ANGIOGRAPHY N/A  11/03/2016   Procedure: Left Heart Cath and Cors/Grafts Angiography;  Surgeon: Lennette Bihari, MD;  [facility] CV LAB;  Service: Cardiovascular;  Laterality: N/A;   LEFT HEART CATH AND CORS/GRAFTS ANGIOGRAPHY N/A 07/07/2017   Procedure: LEFT HEART CATH AND CORS/GRAFTS ANGIOGRAPHY;  Surgeon: Laurey Morale, MD;  Location: [facility] INVASIVE CV LAB;  Service: Cardiovascular;  Laterality: N/A;   LEFT HEART CATH AND CORS/GRAFTS ANGIOGRAPHY N/A 03/30/2018   Procedure: LEFT HEART CATH AND CORS/GRAFTS ANGIOGRAPHY;  Surgeon: Swazil[place], Peter M, MD;  Location: [facility] INVASIVE CV LAB;  Service: Cardiovascular;  Laterality: N/A;   LEFT HEART CATH AND CORS/GRAFTS ANGIOGRAPHY N/A 04/17/2019   Procedure: LEFT HEART CATH AND CORS/GRAFTS ANGIOGRAPHY;  Surgeon: Laurey Morale, MD;  Location: [facility] INVASIVE CV LAB;  Service: Cardiovascular;  Laterality: N/A;   LUMBAR LAMINECTOMY/DECOMPRESSION MICRODISCECTOMY Bilateral 09/01/2022   Procedure: Laminectomy and Foraminotomy - bilateral - Lumbar three-Lumbar four;  Surgeon: Julio Sicks, MD;  Location: [facility] OR;  Service: Neurosurgery;  Laterality: Bilateral;   PERIPHERAL VASCULAR BALLOON ANGIOPLASTY Left 06/02/2022   Procedure: PERIPHERAL VASCULAR BALLOON ANGIOPLASTY;  Surgeon: Nada Libman, MD;  [facility];  Service: Cardiovascular;  Laterality: Left;   PERIPHERAL VASCULAR INTERVENTION Left 03/06/2021   Procedure: PERIPHERAL VASCULAR INTERVENTION;  Surgeon: Cephus Shelling, MD;  [facility];  Service: Cardiovascular;  Laterality: Left;   POLYPECTOMY  12/20/2020   Procedure: POLYPECTOMY;  Surgeon: Napoleon Form, MD;  Location[facility] ENDOSCOPY;  Service: Endoscopy;;   POLYPECTOMY  12/15/2022   Procedure: POLYPECTOMY;  Surgeon: Sherrilyn Rist, MD;  Location: [facility] ENDOSCOPY;  Service: Gastroenterology;;   REVISION OF ARTERIOVENOUS GORETEX GRAFT Left 07/27/2019   Procedure: PLICATION OF ANEURYSM OF ARTERIOVENOUS FISTULA  LEFT ARM;  Surgeon:  Larina Earthly, MD;  Location: [facility] OR;  Service: Vascular;  Laterality: Left;   REVISON OF ARTERIOVENOUS FISTULA Left 09/07/2019   Procedure: REVISON OF LEFT UPPER ARM ARTERIOVENOUS FISTULA;  Surgeon: Larina Earthly, MD;  Location: [facility] OR;  Service: Vascular;  Laterality: Left;   REVISON OF ARTERIOVENOUS FISTULA Left 04/09/2022   Procedure: LEFT ARM FISTULA REVISION;  Surgeon: Leonie Douglas, MD;  Location: [facility] OR;  Service: Vascular;  Laterality: Left;  PERIPHERAL NERVE BLOCK   REVISON OF ARTERIOVENOUS FISTULA Left 04/16/2022   Procedure: REVISON OF ARTERIOVENOUS FISTULA;  Surgeon: Leonie Douglas, MD;  Location: [facility] OR;  Service: Vascular;  Laterality: Left;   RIGHT/LEFT HEART CATH AND CORONARY ANGIOGRAPHY N/A 01/03/2021   Procedure: RIGHT/LEFT HEART CATH AND CORONARY ANGIOGRAPHY;  Surgeon: Laurey Morale, MD;  Location: [facility] INVASIVE CV LAB;  Service: Cardiovascular;  Laterality: N/A;   Patient Active Problem List   Diagnosis Date Noted   Gastric intestinal metaplasia 12/15/2022   Benign neoplasm of ascending colon 12/15/2022   Benign neoplasm of ileocecal valve 12/15/2022   Porokeratosis 11/10/2022   Depression, recurrent (HCC) 10/06/2022   Lumbar stenosis with neurogenic claudication 09/01/2022   Chronic arthritis 08/11/2022   Tinea pedis 06/09/2022   Orthostatic hypotension 04/15/2022   Elevated troponin 04/15/2022   Complication associated with dialysis catheter 04/15/2022   Complication of AV dialysis fistula 04/15/2022   ESRD (end stage renal disease) (HCC) 04/09/2022   Pulmonary emphysema (HCC) 02/21/2022   Acute on chronic anemia 02/21/2022   Type 2 diabetes mellitus with hyperglycemia, with long-term current use of insulin (HCC) 10/28/2021   Type 2 diabetes mellitus with diabetic polyneuropathy, with long-term current use of insulin (HCC) 10/28/2021   Hypertension associated with diabetes (HCC) 09/16/2021   Chronic bilateral low back pain with bilateral sciatica 09/16/2021    Partial seizure disorder (HCC)    Acute respiratory failure with hypoxia (HCC) 07/28/2021   Prolonged QT interval 01/01/2021   Hemoptysis 01/01/2021   Positive fecal occult blood test    Symptomatic bradycardia 12/15/2020   Postural dizziness with presyncope 12/15/2020   Blood in stool 12/15/2020   Acute metabolic encephalopathy 08/29/2019   Altered mental status 08/27/2019   Unresponsive episode 08/27/2019   AF (paroxysmal atrial fibrillation) (HCC) 08/27/2019   Status post coronary artery stent placement    NSTEMI (non-ST elevated myocardial infarction) (HCC) 03/30/2018   Hypertensive heart disease 09/12/2017   Hyperlipidemia associated with type 2 diabetes mellitus (HCC) 09/12/2017   Hyperkalemia 07/20/2017   Chronic diastolic CHF (congestive heart failure) (HCC) 04/14/2017   Hypertensive emergency 04/14/2017   Anemia, chronic renal failure 04/11/2017   Fluid overload 04/11/2017   Diabetic foot ulcer (HCC) 04/11/2017   Cellulitis in diabetic foot (HCC) 01/18/2017   Chest pain 12/16/2016   Hypertensive urgency 12/16/2016   Pressure injury of skin 11/03/2016  Type 2 diabetes mellitus with chronic kidney disease on chronic dialysis, with long-term current use of insulin (HCC) 10/31/2016   ESRD (end stage renal disease) on dialysis (HCC) 10/30/2016   Atherosclerotic heart disease of native coronary artery with other forms of angina pectoris with elevated troponin     Gastroesophageal reflux disease 08/18/2016   HCAP (healthcare-associated pneumonia)    Non-ST elevation (NSTEMI) myocardial infarction [facility])    Pronation deformity of both feet 02/27/2015   Metatarsal deformity 02/27/2015   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   Ischemic rest pain of lower extremity (HCC) 02/06/2015    PCP: Sharlene Dory, DO   REFERRING PROVIDER: Julio Sicks, MD   REFERRING DIAG: [id number] (ICD-10-CM) - Spinal stenosis, lumbar region with neurogenic claudication   Rationale for  Evaluation and Treatment: Rehabilitation  THERAPY DIAG:  Radiculopathy, lumbar region  Cramp and spasm  Muscle weakness (generalized)  ONSET DATE: 3 weeks  SUBJECTIVE:                                                                                                                                                                                           SUBJECTIVE STATEMENT: Pain is about the same.  [place] Interpreter: Brantley Stage   PERTINENT HISTORY:  T2DM, CKD on dialysis, CHF, L3/4 discectomy  PAIN:  Are you having pain? Yes: NPRS scale: 5/10 Pain location: low back and bil LE to feet Pain description: pain in back, electricity in legs Aggravating factors: everything Relieving factors: nothing  PRECAUTIONS: None  WEIGHT BEARING RESTRICTIONS: No  FALLS:  Has patient fallen in last 6 months? No  LIVING ENVIRONMENT: Lives with: lives with their family Lives in: House/apartment Stairs: Yes: Internal: 13 steps; on left going up and External: 5 steps; on left going up Has following equipment at home: Single point cane, Walker - 2 wheeled, and Environmental consultant - 4 wheeled  OCCUPATION: retired  PLOF: Independent with household mobility with device  PATIENT GOALS: get rid of pain  NEXT MD VISIT: 2 weeks now  OBJECTIVE:   DIAGNOSTIC FINDINGS:  MRI 10/26/22 IMPRESSION: 1. Interval posterior decompression and discectomy at L3-4. There is progressive loss of disc height with probable intradiscal vacuum phenomenon and a broad-based central and right-sided disc protrusion which appears slightly increased from the preoperative study. There is progressive mass effect on the thecal sac which is incompletely decompressed posteriorly. Right foraminal narrowing appears slightly worse with probable right L3 nerve root encroachment. 2. No other significant changes are identified. Multilevel spondylosis superimposed on congenitally short pedicles with resulting chronic  multifactorial spinal stenosis from L1-2 through L4-5. 3. Reactive endplate edema at E4-5 without  evidence of discitis/osteomyelitis.  PATIENT SURVEYS:  Modified Oswestry  40 / 50 = 80.0 %   SCREENING FOR RED FLAGS: Bowel or bladder incontinence: No Spinal tumors: No Cauda equina syndrome: No Compression fracture: No Abdominal aneurysm: No  COGNITION: Overall cognitive status: Within functional limits for tasks assessed     SENSATION: WFL  MUSCLE LENGTH: HS: tight bil L> R Piriformis: bil L> R Hip Flexors: NT, but likely tight due to posture in standing Heelcords: WFL   POSTURE: rounded shoulders, forward head, decreased lumbar lordosis, increased thoracic kyphosis, and posterior pelvic tilt  PALPATION: Palpation: TTP at Bil gluteals L >R, bil lumbar.  Spinal mobility NT due to patient's pain level.  LUMBAR ROM:   AROM eval 01/04/23 01/11/23  Flexion Hands to knees Hands to mid shins Hands to mid shins  Extension Unable to get to neutral - bends knees to extend backward Painful when approaches neutral spine Painful when approaches neutral spine  Right lateral flexion 80% limited [facility] WFL  Left lateral flexion 90% limited [facility] WFL  Right rotation 75% limited 50% limited pain  50% limited  Left rotation 80% limited 25% limited  25% limited   (Blank rows = not tested)  LOWER EXTREMITY ROM:   WFL for MMT, hip extension not tested but decreased based on patient's inability to stand in neutral.  LOWER EXTREMITY MMT:    MMT Right eval Left eval Right 01/04/23 Left 01/04/23 Right 01/11/23 Left 01/11/23  Hip flexion 4 4- 4 4+ 5 5  Hip extension 4-* 4-* 4+ 4+ 5 4+  Hip abduction 5 4+ 5 4+ 5 5  Hip adduction 4 4- 5 4 5  4+  Hip internal rotation        Hip external rotation        Knee flexion 5 4+ 5 4+    Knee extension 4+ 4 4+ 4+ 5 4+  Ankle dorsiflexion 5 5 5  4+ 5 5   (Blank rows = not tested)   FUNCTIONAL TESTS:  30 seconds chair stand test: 12/09/22: score is 0 -  able to do 3x w/o hands but used hands 1x; 01/07/23  12 reps no hands  GAIT: Distance walked: 40 Assistive device utilized: Walker - 4 wheeled Level of assistance: Complete Independence Comments: flexed trunk with walker too far ahead of him  TODAY'S TREATMENT:                                                                                                                              DATE:  4/29/224 Therapeutic Exercise: to improve flexibility, strength and mobility.  Verbal and tactile cues throughout for technique Nustep L5 x 6 min Seated trunk extension BTB x 10 cues to retract shoulder blades S/L  hip ADD with ball 20x both S/L  clamshells 15x RTB both S/L reverse clamshells 15x both S/L  ABD x 15 RTB both  Therapeutic Activities: MMT, ROM, mod oswestry, goals assessed  MHP  to low back at end of session x 10 min  01/06/23 Therapeutic Exercise: to improve flexibility, strength and mobility.  Verbal and tactile cues throughout for technique Nustep L5 x 7 min Lat pull 10# x 10, 20# x 20 Rows 20# x30 (2 x 15) - cues to keep shoulders down and retract (25# too much today) Seated trunk extension BTB x 10 S/L  hip ADD with ball 10x both S/L  clamshells 15x RTB both S/L reverse clamshells 15x both S/L  ABD x 15 RTB both  Modalities: TENS trial to lumbar spine x 10 min   01/04/23 Therapeutic Exercise: to improve flexibility, strength and mobility.  Verbal and tactile cues throughout for technique Nustep L5 x 7 min Lat pull 20# x 20 Rows 25# 3x10 - cues to keep shoulders down and retract S/L  hip ADD with ball 10x both S/L  clamshells 10x RTB S/L reverse clamshells 15x both S/L  ABD x 15 RTB  PATIENT EDUCATION:  Education details: HEP update  Person educated: Patient Education method: Explanation, Demonstration, Tactile cues, Verbal cues, and Handouts Education comprehension: verbalized understanding, returned demonstration, verbal cues required, and needs further  education  HOME EXERCISE PROGRAM: Access Code: [email or web address] URL: [email or web address] Date: 12/30/2022 Prepared by: Verta Ellen  Exercises - Seated Hamstring Stretch with Chair  - 2 x daily - 7 x weekly - 1 sets - 3 reps - 20-30 sec hold - Seated Figure 4 Piriformis Stretch  - 2 x daily - 7 x weekly - 1 sets - 3 reps - 30-60 sec hold - Seated Trunk Rotation  - 1 x daily - 7 x weekly - 1 sets - 10 reps - 10 sec hold - Seated Thoracic Extension Arms Overhead  - 1 x daily - 7 x weekly - 2 sets - 10 reps - Seated Pelvic Tilt  - 1 x daily - 7 x weekly - 2 sets - 10 reps - Seated Diagonal Chops with Medicine Ball  - 1 x daily - 7 x weekly - 2 sets - 10 reps - Seated Shoulder Horizontal Abduction with Resistance - Palms Down  - 1 x daily - 7 x weekly - 2 sets - 10 reps - Seated Hip Flexor Stretch  - 2 x daily - 7 x weekly - 1 sets - 3 reps - 30-60 sec hold - Abdominal Press into [place]  - 1 x daily - 7 x weekly - 2 sets - 10 reps - 5 sec hold - Clamshell  - 1 x daily - 7 x weekly - 1-3 sets - 10 reps - Sidelying Reverse Clamshell  - 1 x daily - 7 x weekly - 1-3 sets - 10 reps - Sidelying Hip Adduction Isometric with Ball  - 1 x daily - 7 x weekly - 1-3 sets - 10 reps - 5 sec hold hold  ASSESSMENT:  CLINICAL IMPRESSION: Tannen continues to make improvements toward all goals except pain. He reports only 6% improvement in pain overall. He gets some relief with estim, DN and STM. He has ordered a TENS unit to help with pain at home. Functionally he is able to walk longer distances reporting that he is able to walk 30 min in the morning and 15 min at lunch outside with his wife following with rollator. Pain is what limits him. His strength continues to progress. His Modified Oswestry improved from 40-34 meeting this LTG. Sleep is still limited to 3-4 hours at a time. He still has limitations in lumbar  ROM, especially extension and R rotation which affects ADLS such as reaching overhead and  across his body. Marik continues to demonstrate potential for improvement and would benefit from continued skilled therapy to address impairments.    OBJECTIVE IMPAIRMENTS: decreased activity tolerance, difficulty walking, decreased ROM, decreased strength, increased muscle spasms, impaired flexibility, postural dysfunction, and pain.   ACTIVITY LIMITATIONS: carrying, lifting, bending, sitting, standing, squatting, sleeping, stairs, transfers, bed mobility, hygiene/grooming, and locomotion level  PARTICIPATION LIMITATIONS:  all ADLs limited  PERSONAL FACTORS: Time since onset of injury/illness/exacerbation and 3+ comorbidities: T2DM, CKD on dialysis, CHF, L3/4 discectomy  are also affecting patient's functional outcome.   REHAB POTENTIAL: Good  CLINICAL DECISION MAKING: Evolving/moderate complexity  EVALUATION COMPLEXITY: Moderate   GOALS: Goals reviewed with patient? Yes  SHORT TERM GOALS: Target date: 01/04/2023   Patient will be independent with initial HEP.  Baseline:  Goal status: MET  2.  Patient will report decreased pain by 25%.  Baseline:  Goal status: IN PROGRESS - 12/28/22 20%  LONG TERM GOALS: Target date: 02/01/2023    Patient will be independent with advanced/ongoing HEP to improve outcomes and carryover.  Baseline:  Goal status: IN PROGRESS 01/11/23 I with current HEP  2.  Patient will report 50-75% improvement in low back and leg pain to improve QOL.  Baseline:  Goal status: IN PROGRESS 01/11/23  6% improvement in pain; however he reports his endurance for walking has improved.  3.  Patient will demonstrate functional lumbar ROM to perform ADLs.   Baseline:  Goal status: IN PROGRESS  01/11/23 limited in extension and R rotation by pain  4.  Patient will demonstrate improved functional strength by increased 30 second chair stand test by 5. Baseline: 0 Goal status: MET 12 reps  5.  Patient will report 34 on modified oswestry to demonstrate improved functional  ability.  Baseline:  40 / 50 = 80.0 % Goal status: MET 01/11/23  34 / 50 = 68.0 %  6.  Patient will report ability to sleep a minimum of 5 hours without waking from pain. Baseline:  Goal status: IN PROGRESS  01/11/23 Still limited to 3-4 hours     PLAN:  PT FREQUENCY: 2x/week  PT DURATION: 8 weeks  PLANNED INTERVENTIONS: Therapeutic exercises, Therapeutic activity, Neuromuscular re-education, Balance training, Gait training, Patient/Family education, Self Care, Joint mobilization, Aquatic Therapy, Dry Needling, Electrical stimulation, Spinal mobilization, Cryotherapy, Moist heat, scar mobilization, Taping, Ultrasound, Ionotophoresis 4mg /ml Dexamethasone, and Manual therapy.  PLAN FOR NEXT SESSION:  Educate on TENS when pt brings in, continue core and LE strengthening as tolerated.Give Blue band for trunk ext.   Blanche Gallien, PT 01/11/2023, 4:04 PM

## 2023-01-11 ENCOUNTER — Ambulatory Visit: Payer: 59 | Admitting: Physical Therapy

## 2023-01-11 ENCOUNTER — Encounter: Payer: Self-pay | Admitting: Physical Therapy

## 2023-01-11 DIAGNOSIS — R252 Cramp and spasm: Secondary | ICD-10-CM

## 2023-01-11 DIAGNOSIS — M5416 Radiculopathy, lumbar region: Secondary | ICD-10-CM

## 2023-01-11 DIAGNOSIS — M6281 Muscle weakness (generalized): Secondary | ICD-10-CM

## 2023-01-12 ENCOUNTER — Other Ambulatory Visit (HOSPITAL_BASED_OUTPATIENT_CLINIC_OR_DEPARTMENT_OTHER): Payer: Self-pay

## 2023-01-12 ENCOUNTER — Other Ambulatory Visit: Payer: Self-pay | Admitting: Internal Medicine

## 2023-01-12 MED ORDER — LYUMJEV 100 UNIT/ML IJ SOLN
INTRAMUSCULAR | 3 refills | Status: DC
  Filled 2023-01-12: qty 30, 100d supply, fill #0
  Filled 2023-06-09: qty 30, 100d supply, fill #1
  Filled 2023-12-22: qty 30, 100d supply, fill #2

## 2023-01-13 ENCOUNTER — Ambulatory Visit: Payer: 59 | Attending: Neurosurgery

## 2023-01-13 ENCOUNTER — Other Ambulatory Visit (HOSPITAL_BASED_OUTPATIENT_CLINIC_OR_DEPARTMENT_OTHER): Payer: Self-pay

## 2023-01-13 DIAGNOSIS — R252 Cramp and spasm: Secondary | ICD-10-CM | POA: Insufficient documentation

## 2023-01-13 DIAGNOSIS — M6281 Muscle weakness (generalized): Secondary | ICD-10-CM

## 2023-01-13 DIAGNOSIS — M5416 Radiculopathy, lumbar region: Secondary | ICD-10-CM | POA: Diagnosis present

## 2023-01-13 MED ORDER — HYDROCODONE-ACETAMINOPHEN 10-325 MG PO TABS
1.0000 | ORAL_TABLET | ORAL | 0 refills | Status: DC
  Filled 2023-01-13: qty 60, 10d supply, fill #0

## 2023-01-13 NOTE — Therapy (Signed)
OUTPATIENT PHYSICAL THERAPY TREATMENT       Patient Name: Kenneth [name] MRN: [id number] DOB:[birth year], 67 y.o., male Today's Date: 01/13/2023  END OF SESSION:  PT End of Session - 01/13/23 1436     Visit Number 11    Date for PT Re-Evaluation 02/01/23    Authorization Type [employer] MCR    Progress Note Due on Visit 20    PT Start Time 1401    PT Stop Time 1442    PT Time Calculation (min) 41 min    Activity Tolerance Patient tolerated treatment well    Behavior During Therapy WFL for tasks assessed/performed                 Past Medical History:  Diagnosis Date   Arthritis    hands   CAD (coronary artery disease) 10/30/2016   NSTEMI 9/17 with CABG x 3 (LIMA-LAD, SVG-OM, SVG-RCA).   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   type 1 DM   Dyspnea    with exertion   ESRD on hemodialysis (HCC)    "MWF; [place]" (03/30/2018)   Gastroesophageal reflux disease 08/18/2016   Hyperlipidemia    Hypertension    Ischemic rest pain of lower extremity 02/06/2015   Keratoma 02/27/2015   Metatarsal deformity 02/27/2015   PAF (paroxysmal atrial fibrillation) (HCC)    Pronation deformity of both feet 02/27/2015   Past Surgical History:  Procedure Laterality Date   A/V FISTULAGRAM Left 03/06/2021   Procedure: A/V FISTULAGRAM;  Surgeon: Cephus Shelling, MD;  Location: [facility] IN[facility] CV LAB;  Service: Cardiovascular;  Laterality: Left;   A/V FISTULAGRAM Left 06/02/2022   Procedure: A/V Fistulagram;  Surgeon: Nada Libman, MD;  Location: [facility];  Service: Cardiovascular;  Laterality: Left;   AV FISTULA PLACEMENT Left 06/22/2016   Procedure: ARTERIOVENOUS (AV) FISTULA CREATION LEFT UPPER ARM;  Surgeon: Larina Earthly, MD;  Location: [facility] OR;  Service: Vascular;  Laterality: Left;   BIOPSY  12/19/2020   Procedure: BIOPSY;  Surgeon:  Meryl Dare, MD;  Location: [facility] ENDOSCOPY;  Service: Endoscopy;;   BIOPSY  12/15/2022   Procedure: BIOPSY;  Surgeon: Sherrilyn Rist, MD;  Location: [facility] ENDOSCOPY;  Service: Gastroenterology;;   CARDIAC CATHETERIZATION N/A 05/31/2016   Procedure: Left Heart Cath and Coronary Angiography;  Surgeon: Corky Crafts, MD;  Location: [facility] INVASIVE CV LAB;  Service: Cardiovascular;  Laterality: N/A;   CARDIAC CATHETERIZATION N/A 05/31/2016   Procedure: Right Heart Cath;  Surgeon: Corky Crafts, MD;  Location: [facility] INVASIVE CV LAB;  Service: Cardiovascular;  Laterality: N/A;   CARDIAC CATHETERIZATION N/A 05/31/2016   Procedure: IABP Insertion;  Surgeon: Corky Crafts, MD;  [facility]: [facility];  Service: Cardiovascular;  Laterality: N/A;   COLONOSCOPY WITH PROPOFOL N/A 12/20/2020   Procedure: COLONOSCOPY WITH PROPOFOL;  Surgeon: Napoleon Form, MD;  Location: [facility] ENDOSCOPY;  Service: Endoscopy;  Laterality: N/A;   COLONOSCOPY WITH PROPOFOL N/A 12/15/2022   Procedure: COLONOSCOPY WITH PROPOFOL;  Surgeon: Sherrilyn Rist, MD;  Location: [facility] ENDOSCOPY;  Service: Gastroenterology;  Laterality: N/A;   CORONARY ARTERY BYPASS GRAFT N/A 06/05/2016   Procedure: CORONARY ARTERY BYPASS GRAFTING (CABG) x3 LIMA to LAD -SVG to OM -SVG to RCA;  Surgeon: Kerin Perna, MD;  Location: [facility]  OR;  Service: Open Heart Surgery;  Laterality: N/A;   CORONARY STENT INTERVENTION N/A 07/07/2017   Procedure: CORONARY STENT INTERVENTION;  Surgeon: Iran Ouch, MD;  [facility]: [facility];  Service: Cardiovascular;  Laterality: N/A;   CORONARY STENT INTERVENTION N/A 03/30/2018   Procedure: CORONARY STENT INTERVENTION;  Surgeon: Swazil[place], Peter M, MD;  Location: [facility] INVASIVE CV LAB;  Service: Cardiovascular;  Laterality: N/A;   ESOPHAGOGASTRODUODENOSCOPY (EGD) WITH PROPOFOL N/A 12/19/2020   Procedure: ESOPHAGOGASTRODUODENOSCOPY (EGD) WITH PROPOFOL;  Surgeon: Meryl Dare, MD;  Location: [facility] ENDOSCOPY;   Service: Endoscopy;  Laterality: N/A;   ESOPHAGOGASTRODUODENOSCOPY (EGD) WITH PROPOFOL N/A 12/15/2022   Procedure: ESOPHAGOGASTRODUODENOSCOPY (EGD) WITH PROPOFOL;  Surgeon: Sherrilyn Rist, MD;  Location: [facility] ENDOSCOPY;  Service: Gastroenterology;  Laterality: N/A;   EXCHANGE OF A DIALYSIS CATHETER Right 04/16/2022   Procedure: EXCHANGE OF A DIALYSIS CATHETER;  Surgeon: Leonie Douglas, MD;  Location: [facility] OR;  Service: Vascular;  Laterality: Right;   GIVENS CAPSULE STUDY N/A 12/20/2020   Procedure: GIVENS CAPSULE STUDY;  Surgeon: Napoleon Form, MD;  Location: [facility] ENDOSCOPY;  Service: Endoscopy;  Laterality: N/A;   HEMOSTASIS CLIP PLACEMENT  12/15/2022   Procedure: HEMOSTASIS CLIP PLACEMENT;  Surgeon: Sherrilyn Rist, MD;  Location: [facility] ENDOSCOPY;  Service: Gastroenterology;;   INSERTION OF DIALYSIS CATHETER N/A 06/05/2016   Procedure: INSERTION OF DIALYSIS/trialysis CATHETER;  Surgeon: Kerin Perna, MD;  Location: [facility] OR;  Service: Vascular;  Laterality: N/A;   INSERTION OF DIALYSIS CATHETER Right 06/13/2016   Procedure: INSERTION OF DIALYSIS CATHETER RIGHT INTERNAL JUGULAR;  Surgeon: Fransisco Hertz, MD;  Location: [facility] OR;  Service: Vascular;  Laterality: Right;   INSERTION OF DIALYSIS CATHETER Right 09/07/2019   Procedure: Insertion Of Dialysis Catheter;  Surgeon: Larina Earthly, MD;  Location: [facility] OR;  Service: Vascular;  Laterality: Right;   INSERTION OF DIALYSIS CATHETER N/A 04/09/2022   Procedure: INSERTION OF TUNNELED DIALYSIS CATHETER;  Surgeon: Leonie Douglas, MD;  Location: [facility] OR;  Service: Vascular;  Laterality: N/A;   INTRAOPERATIVE TRANSESOPHAGEAL ECHOCARDIOGRAM N/A 06/05/2016   Procedure: INTRAOPERATIVE TRANSESOPHAGEAL ECHOCARDIOGRAM;  Surgeon: Kerin Perna, MD;  Location: [facility] OR;  Service: Open Heart Surgery;  Laterality: N/A;   LEFT HEART CATH AND CORS/GRAFTS ANGIOGRAPHY N/A 11/03/2016   Procedure: Left Heart Cath and Cors/Grafts Angiography;  Surgeon: Lennette Bihari, MD;  Location:  [facility] CV [facility];  Service: Cardiovascular;  Laterality: N/A;   LEFT HEART CATH AND CORS/GRAFTS ANGIOGRAPHY N/A 07/07/2017   Procedure: LEFT HEART CATH AND CORS/GRAFTS ANGIOGRAPHY;  Surgeon: Laurey Morale, MD;  Location: [facility] INVASIVE CV LAB;  Service: Cardiovascular;  Laterality: N/A;   LEFT HEART CATH AND CORS/GRAFTS ANGIOGRAPHY N/A 03/30/2018   Procedure: LEFT HEART CATH AND CORS/GRAFTS ANGIOGRAPHY;  Surgeon: Swazil[place], Peter M, MD;  Location: [facility] INVASIVE CV LAB;  Service: Cardiovascular;  Laterality: N/A;   LEFT HEART CATH AND CORS/GRAFTS ANGIOGRAPHY N/A 04/17/2019   Procedure: LEFT HEART CATH AND CORS/GRAFTS ANGIOGRAPHY;  Surgeon: Laurey Morale, MD;  Location: [facility] INVASIVE CV LAB;  Service: Cardiovascular;  Laterality: N/A;   LUMBAR LAMINECTOMY/DECOMPRESSION MICRODISCECTOMY Bilateral 09/01/2022   Procedure: Laminectomy and Foraminotomy - bilateral - Lumbar three-Lumbar four;  Surgeon: Julio Sicks, MD;  Location: [facility] OR;  Service: Neurosurgery;  Laterality: Bilateral;   PERIPHERAL VASCULAR BALLOON ANGIOPLASTY Left 06/02/2022   Procedure: PERIPHERAL VASCULAR BALLOON ANGIOPLASTY;  Surgeon: Nada Libman, MD;  Location: [facility];  Service: Cardiovascular;  Laterality: Left;  PERIPHERAL VASCULAR INTERVENTION Left 03/06/2021   Procedure: PERIPHERAL VASCULAR INTERVENTION;  Surgeon: Cephus Shelling, MD;  Location: [facility] INVASIVE CV LAB;  Service: Cardiovascular;  Laterality: Left;   POLYPECTOMY  12/20/2020   Procedure: POLYPECTOMY;  Surgeon: Napoleon Form, MD;  Location: [facility] ENDOSCOPY;  Service: Endoscopy;;   POLYPECTOMY  12/15/2022   Procedure: POLYPECTOMY;  Surgeon: Sherrilyn Rist, MD;  Location: [facility] ENDOSCOPY;  Service: Gastroenterology;;   REVISION OF ARTERIOVENOUS GORETEX GRAFT Left 07/27/2019   Procedure: PLICATION OF ANEURYSM OF ARTERIOVENOUS FISTULA  LEFT ARM;  Surgeon: Larina Earthly, MD;  Location: [facility] OR;  Service: Vascular;  Laterality: Left;   REVISON OF ARTERIOVENOUS  FISTULA Left 09/07/2019   Procedure: REVISON OF LEFT UPPER ARM ARTERIOVENOUS FISTULA;  Surgeon: Larina Earthly, MD;  Location: [facility] OR;  Service: Vascular;  Laterality: Left;   REVISON OF ARTERIOVENOUS FISTULA Left 04/09/2022   Procedure: LEFT ARM FISTULA REVISION;  Surgeon: Leonie Douglas, MD;  Location: [facility] OR;  Service: Vascular;  Laterality: Left;  PERIPHERAL NERVE BLOCK   REVISON OF ARTERIOVENOUS FISTULA Left 04/16/2022   Procedure: REVISON OF ARTERIOVENOUS FISTULA;  Surgeon: Leonie Douglas, MD;  Location: [facility] OR;  Service: Vascular;  Laterality: Left;   RIGHT/LEFT HEART CATH AND CORONARY ANGIOGRAPHY N/A 01/03/2021   Procedure: RIGHT/LEFT HEART CATH AND CORONARY ANGIOGRAPHY;  Surgeon: Laurey Morale, MD;  Location: [facility] INVASIVE CV LAB;  Service: Cardiovascular;  Laterality: N/A;   Patient Active Problem List   Diagnosis Date Noted   Gastric intestinal metaplasia 12/15/2022   Benign neoplasm of ascending colon 12/15/2022   Benign neoplasm of ileocecal valve 12/15/2022   Porokeratosis 11/10/2022   Depression, recurrent (HCC) 10/06/2022   Lumbar stenosis with neurogenic claudication 09/01/2022   Chronic arthritis 08/11/2022   Tinea pedis 06/09/2022   Orthostatic hypotension 04/15/2022   Elevated troponin 04/15/2022   Complication associated with dialysis catheter 04/15/2022   Complication of AV dialysis fistula 04/15/2022   ESRD (end stage renal disease) (HCC) 04/09/2022   Pulmonary emphysema (HCC) 02/21/2022   Acute on chronic anemia 02/21/2022   Type 2 diabetes mellitus with hyperglycemia, with long-term current use of insulin (HCC) 10/28/2021   Type 2 diabetes mellitus with diabetic polyneuropathy, with long-term current use of insulin (HCC) 10/28/2021   Hypertension associated with diabetes (HCC) 09/16/2021   Chronic bilateral low back pain with bilateral sciatica 09/16/2021   Partial seizure disorder (HCC)    Acute respiratory failure with hypoxia (HCC) 07/28/2021   Prolonged QT  interval 01/01/2021   Hemoptysis 01/01/2021   Positive fecal occult blood test    Symptomatic bradycardia 12/15/2020   Postural dizziness with presyncope 12/15/2020   Blood in stool 12/15/2020   Acute metabolic encephalopathy 08/29/2019   Altered mental status 08/27/2019   Unresponsive episode 08/27/2019   AF (paroxysmal atrial fibrillation) (HCC) 08/27/2019   Status post coronary artery stent placement    NSTEMI (non-ST elevated myocardial infarction) (HCC) 03/30/2018   Hypertensive heart disease 09/12/2017   Hyperlipidemia associated with type 2 diabetes mellitus (HCC) 09/12/2017   Hyperkalemia 07/20/2017   Chronic diastolic CHF (congestive heart failure) (HCC) 04/14/2017   Hypertensive emergency 04/14/2017   Anemia, chronic renal failure 04/11/2017   Fluid overload 04/11/2017   Diabetic foot ulcer (HCC) 04/11/2017   Cellulitis in diabetic foot (HCC) 01/18/2017   Chest pain 12/16/2016   Hypertensive urgency 12/16/2016   Pressure injury of skin 11/03/2016   Type 2 diabetes mellitus with chronic kidney disease on chronic dialysis, with  long-term current use of insulin (HCC) 10/31/2016   ESRD (end stage renal disease) on dialysis (HCC) 10/30/2016   Atherosclerotic heart disease of native coronary artery with other forms of angina pectoris with elevated troponin     Gastroesophageal reflux disease 08/18/2016   HCAP (healthcare-associated pneumonia)    Non-ST elevation (NSTEMI) myocardial infarction [facility])    Pronation deformity of both feet 02/27/2015   Metatarsal deformity 02/27/2015   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   Ischemic rest pain of lower extremity (HCC) 02/06/2015    PCP: Sharlene Dory, DO   REFERRING PROVIDER: Julio Sicks, MD   REFERRING DIAG: [phone] (ICD-10-CM) - Spinal stenosis, lumbar region with neurogenic claudication   Rationale for Evaluation and Treatment: Rehabilitation  THERAPY DIAG:  Radiculopathy, lumbar region  Cramp and  spasm  Muscle weakness (generalized)  ONSET DATE: 3 weeks  SUBJECTIVE:                                                                                                                                                                                           SUBJECTIVE STATEMENT: Patient notes improvement in pain down both legs, back pain remains.   Gifford Interpreter: Brantley Stage   PERTINENT HISTORY:  T2DM, CKD on dialysis, CHF, L3/4 discectomy  PAIN:  Are you having pain? Yes: NPRS scale: 4-5/10 Pain location: low back  Pain description: pain in back Aggravating factors: everything Relieving factors: nothing  PRECAUTIONS: None  WEIGHT BEARING RESTRICTIONS: No  FALLS:  Has patient fallen in last 6 months? No  LIVING ENVIRONMENT: Lives with: lives with their family Lives in: House/apartment Stairs: Yes: Internal: 13 steps; on left going up and External: 5 steps; on left going up Has following equipment at home: Single point cane, Walker - 2 wheeled, and Environmental consultant - 4 wheeled  OCCUPATION: retired  PLOF: Independent with household mobility with device  PATIENT GOALS: get rid of pain  NEXT MD VISIT: 2 weeks now  OBJECTIVE:   DIAGNOSTIC FINDINGS:  MRI 10/26/22 IMPRESSION: 1. Interval posterior decompression and discectomy at L3-4. There is progressive loss of disc height with probable intradiscal vacuum phenomenon and a broad-based central and right-sided disc protrusion which appears slightly increased from the preoperative study. There is progressive mass effect on the thecal sac which is incompletely decompressed posteriorly. Right foraminal narrowing appears slightly worse with probable right L3 nerve root encroachment. 2. No other significant changes are identified. Multilevel spondylosis superimposed on congenitally short pedicles with resulting chronic multifactorial spinal stenosis from L1-2 through L4-5. 3. Reactive endplate edema at F6-2 without  evidence of discitis/osteomyelitis.  PATIENT SURVEYS:  Modified Oswestry  40 /  50 = 80.0 %   SCREENING FOR RED FLAGS: Bowel or bladder incontinence: No Spinal tumors: No Cauda equina syndrome: No Compression fracture: No Abdominal aneurysm: No  COGNITION: Overall cognitive status: Within functional limits for tasks assessed     SENSATION: WFL  MUSCLE LENGTH: HS: tight bil L> R Piriformis: bil L> R Hip Flexors: NT, but likely tight due to posture in standing Heelcords: WFL   POSTURE: rounded shoulders, forward head, decreased lumbar lordosis, increased thoracic kyphosis, and posterior pelvic tilt  PALPATION: Palpation: TTP at Bil gluteals L >R, bil lumbar.  Spinal mobility NT due to patient's pain level.  LUMBAR ROM:   AROM eval 01/04/23 01/11/23  Flexion Hands to knees Hands to mid shins Hands to mid shins  Extension Unable to get to neutral - bends knees to extend backward Painful when approaches neutral spine Painful when approaches neutral spine  Right lateral flexion 80% limited [facility] WFL  Left lateral flexion 90% limited [facility] WFL  Right rotation 75% limited 50% limited pain  50% limited  Left rotation 80% limited 25% limited  25% limited   (Blank rows = not tested)  LOWER EXTREMITY ROM:   WFL for MMT, hip extension not tested but decreased based on patient's inability to stand in neutral.  LOWER EXTREMITY MMT:    MMT Right eval Left eval Right 01/04/23 Left 01/04/23 Right 01/11/23 Left 01/11/23  Hip flexion 4 4- 4 4+ 5 5  Hip extension 4-* 4-* 4+ 4+ 5 4+  Hip abduction 5 4+ 5 4+ 5 5  Hip adduction 4 4- 5 4 5  4+  Hip internal rotation        Hip external rotation        Knee flexion 5 4+ 5 4+    Knee extension 4+ 4 4+ 4+ 5 4+  Ankle dorsiflexion 5 5 5  4+ 5 5   (Blank rows = not tested)   FUNCTIONAL TESTS:  30 seconds chair stand test: 12/09/22: score is 0 - able to do 3x w/o hands but used hands 1x; 01/07/23  12 reps no hands  GAIT: Distance walked:  40 Assistive device utilized: Walker - 4 wheeled Level of assistance: Complete Independence Comments: flexed trunk with walker too far ahead of him  TODAY'S TREATMENT:                                                                                                                              DATE: 01/13/23 Therapeutic Exercise: to improve flexibility, strength and mobility.  Verbal and tactile cues throughout for technique Nustep L5 x 6 min Review of TENS device with patient Standing thoracic extension at wall with shld flexion up wall x 10 Lumbar extension at row machine 25# x 10 Standing reciprocal arm swing x 10 B S/L  clamshells 20x RTB both S/L reverse clamshells 20x both      4/29/224 Therapeutic Exercise: to improve flexibility, strength and mobility.  Verbal and  tactile cues throughout for technique Nustep L5 x 6 min Seated trunk extension BTB x 10 cues to retract shoulder blades S/L  hip ADD with ball 20x both S/L  clamshells 15x RTB both S/L reverse clamshells 15x both S/L  ABD x 15 RTB both  Therapeutic Activities: MMT, ROM, mod oswestry, goals assessed  MHP to low back at end of session x 10 min  01/06/23 Therapeutic Exercise: to improve flexibility, strength and mobility.  Verbal and tactile cues throughout for technique Nustep L5 x 7 min Lat pull 10# x 10, 20# x 20 Rows 20# x30 (2 x 15) - cues to keep shoulders down and retract (25# too much today) Seated trunk extension BTB x 10 S/L  hip ADD with ball 10x both S/L  clamshells 15x RTB both S/L reverse clamshells 15x both S/L  ABD x 15 RTB both  Modalities: TENS trial to lumbar spine x 10 min   01/04/23 Therapeutic Exercise: to improve flexibility, strength and mobility.  Verbal and tactile cues throughout for technique Nustep L5 x 7 min Lat pull 20# x 20 Rows 25# 3x10 - cues to keep shoulders down and retract S/L  hip ADD with ball 10x both S/L  clamshells 10x RTB S/L reverse clamshells 15x both S/L   ABD x 15 RTB  PATIENT EDUCATION:  Education details: HEP update  Person educated: Patient Education method: Explanation, Demonstration, Tactile cues, Verbal cues, and Handouts Education comprehension: verbalized understanding, returned demonstration, verbal cues required, and needs further education  HOME EXERCISE PROGRAM: Access Code: [email or web address] URL: [email or web address] Date: 12/30/2022 Prepared by: Verta Ellen  Exercises - Seated Hamstring Stretch with Chair  - 2 x daily - 7 x weekly - 1 sets - 3 reps - 20-30 sec hold - Seated Figure 4 Piriformis Stretch  - 2 x daily - 7 x weekly - 1 sets - 3 reps - 30-60 sec hold - Seated Trunk Rotation  - 1 x daily - 7 x weekly - 1 sets - 10 reps - 10 sec hold - Seated Thoracic Extension Arms Overhead  - 1 x daily - 7 x weekly - 2 sets - 10 reps - Seated Pelvic Tilt  - 1 x daily - 7 x weekly - 2 sets - 10 reps - Seated Diagonal Chops with Medicine Ball  - 1 x daily - 7 x weekly - 2 sets - 10 reps - Seated Shoulder Horizontal Abduction with Resistance - Palms Down  - 1 x daily - 7 x weekly - 2 sets - 10 reps - Seated Hip Flexor Stretch  - 2 x daily - 7 x weekly - 1 sets - 3 reps - 30-60 sec hold - Abdominal Press into [place]  - 1 x daily - 7 x weekly - 2 sets - 10 reps - 5 sec hold - Clamshell  - 1 x daily - 7 x weekly - 1-3 sets - 10 reps - Sidelying Reverse Clamshell  - 1 x daily - 7 x weekly - 1-3 sets - 10 reps - Sidelying Hip Adduction Isometric with Ball  - 1 x daily - 7 x weekly - 1-3 sets - 10 reps - 5 sec hold hold  ASSESSMENT:  CLINICAL IMPRESSION: Reviewed TENS device with patient for home use. He showed good understanding of device usage. Continued working on improving posture and strength to reduce LBP. It remains painful for him to extend to neutral position. Tactile and verbal cues required with L side reverse clamshells.  OBJECTIVE IMPAIRMENTS: decreased activity tolerance, difficulty walking, decreased ROM,  decreased strength, increased muscle spasms, impaired flexibility, postural dysfunction, and pain.   ACTIVITY LIMITATIONS: carrying, lifting, bending, sitting, standing, squatting, sleeping, stairs, transfers, bed mobility, hygiene/grooming, and locomotion level  PARTICIPATION LIMITATIONS:  all ADLs limited  PERSONAL FACTORS: Time since onset of injury/illness/exacerbation and 3+ comorbidities: T2DM, CKD on dialysis, CHF, L3/4 discectomy  are also affecting patient's functional outcome.   REHAB POTENTIAL: Good  CLINICAL DECISION MAKING: Evolving/moderate complexity  EVALUATION COMPLEXITY: Moderate   GOALS: Goals reviewed with patient? Yes  SHORT TERM GOALS: Target date: 01/04/2023   Patient will be independent with initial HEP.  Baseline:  Goal status: MET  2.  Patient will report decreased pain by 25%.  Baseline:  Goal status: IN PROGRESS - 12/28/22 20%  LONG TERM GOALS: Target date: 02/01/2023    Patient will be independent with advanced/ongoing HEP to improve outcomes and carryover.  Baseline:  Goal status: IN PROGRESS 01/11/23 I with current HEP  2.  Patient will report 50-75% improvement in low back and leg pain to improve QOL.  Baseline:  Goal status: IN PROGRESS 01/11/23  6% improvement in pain; however he reports his endurance for walking has improved.  3.  Patient will demonstrate functional lumbar ROM to perform ADLs.   Baseline:  Goal status: IN PROGRESS  01/11/23 limited in extension and R rotation by pain  4.  Patient will demonstrate improved functional strength by increased 30 second chair stand test by 5. Baseline: 0 Goal status: MET 12 reps  5.  Patient will report 34 on modified oswestry to demonstrate improved functional ability.  Baseline:  40 / 50 = 80.0 % Goal status: MET 01/11/23  34 / 50 = 68.0 %  6.  Patient will report ability to sleep a minimum of 5 hours without waking from pain. Baseline:  Goal status: IN PROGRESS  01/11/23 Still limited to 3-4  hours     PLAN:  PT FREQUENCY: 2x/week  PT DURATION: 8 weeks  PLANNED INTERVENTIONS: Therapeutic exercises, Therapeutic activity, Neuromuscular re-education, Balance training, Gait training, Patient/Family education, Self Care, Joint mobilization, Aquatic Therapy, Dry Needling, Electrical stimulation, Spinal mobilization, Cryotherapy, Moist heat, scar mobilization, Taping, Ultrasound, Ionotophoresis 4mg /ml Dexamethasone, and Manual therapy.  PLAN FOR NEXT SESSION:  Educate on TENS when pt brings in, continue core and LE strengthening as tolerated.Give Blue band for trunk ext.   Darleene Cleaver, PTA 01/13/2023, 2:47 PM

## 2023-01-14 ENCOUNTER — Other Ambulatory Visit (HOSPITAL_BASED_OUTPATIENT_CLINIC_OR_DEPARTMENT_OTHER): Payer: Self-pay

## 2023-01-14 MED ORDER — LOKELMA 10 G PO PACK
PACK | ORAL | 11 refills | Status: AC
  Filled 2023-01-14: qty 13, 30d supply, fill #0

## 2023-01-15 ENCOUNTER — Other Ambulatory Visit (HOSPITAL_BASED_OUTPATIENT_CLINIC_OR_DEPARTMENT_OTHER): Payer: Self-pay

## 2023-01-17 NOTE — Therapy (Signed)
OUTPATIENT PHYSICAL THERAPY TREATMENT       Patient Name: GRIGORIY TREICHLER MRN: [id number] DOB:11-Jul-1956, 67 y.o., male Today's Date: 01/18/2023  END OF SESSION:  PT End of Session - 01/18/23 1357     Visit Number 12    Date for PT Re-Evaluation 02/01/23    Authorization Type U[employer] MCR    Progress Note Due on Visit 20    PT Start Time 1350    PT Stop Time 1444    PT Time Calculation (min) 54 min    Activity Tolerance Patient tolerated treatment well    Behavior During Therapy WFL for tasks assessed/performed                 Past Medical History:  Diagnosis Date   Arthritis    hands   CAD (coronary artery disease) 10/30/2016   NSTEMI 9/17 with CABG x 3 (LIMA-LAD, SVG-OM, SVG-RCA).   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   type 1 DM   Dyspnea    with exertion   ESRD on hemodialysis (HCC)    "MWF; [place]" (03/30/2018)   Gastroesophageal reflux disease 08/18/2016   Hyperlipidemia    Hypertension    Ischemic rest pain of lower extremity 02/06/2015   Keratoma 02/27/2015   Metatarsal deformity 02/27/2015   PAF (paroxysmal atrial fibrillation) (HCC)    Pronation deformity of both feet 02/27/2015   Past Surgical History:  Procedure Laterality Date   A/V FISTULAGRAM Left 03/06/2021   Procedure: A/V FISTULAGRAM;  Surgeon: Cephus Shelling, MD;  Location: [facility] IN[facility] LAB;  Service: Cardiovascular;  Laterality: Left;   A/V FISTULAGRAM Left 06/02/2022   Procedure: A/V Fistulagram;  Surgeon: Nada Libman, MD;  Location: [facility];  Service: Cardiovascular;  Laterality: Left;   AV FISTULA PLACEMENT Left 06/22/2016   Procedure: ARTERIOVENOUS (AV) FISTULA CREATION LEFT UPPER ARM;  Surgeon: Larina Earthly, MD;  Location: [facility] OR;  Service: Vascular;  Laterality: Left;   BIOPSY  12/19/2020   Procedure: BIOPSY;  Surgeon:  Meryl Dare, MD;  Location: [facility] ENDOSCOPY;  Service: Endoscopy;;   BIOPSY  12/15/2022   Procedure: BIOPSY;  Surgeon: Sherrilyn Rist, MD;  Location: [facility] ENDOSCOPY;  Service: Gastroenterology;;   CARDIAC CATHETERIZATION N/A 05/31/2016   Procedure: Left Heart Cath and Coronary Angiography;  Surgeon: Corky Crafts, MD;  Location: [facility] INVASIVE CV LAB;  Service: Cardiovascular;  Laterality: N/A;   CARDIAC CATHETERIZATION N/A 05/31/2016   Procedure: Right Heart Cath;  Surgeon: Corky Crafts, MD;  Location: [facility] INVASIVE CV LAB;  Service: Cardiovascular;  Laterality: N/A;   CARDIAC CATHETERIZATION N/A 05/31/2016   Procedure: IABP Insertion;  Surgeon: Corky Crafts, MD;  [facility]: [facility];  Service: Cardiovascular;  Laterality: N/A;   COLONOSCOPY WITH PROPOFOL N/A 12/20/2020   Procedure: COLONOSCOPY WITH PROPOFOL;  Surgeon: Napoleon Form, MD;  Location: [facility] ENDOSCOPY;  Service: Endoscopy;  Laterality: N/A;   COLONOSCOPY WITH PROPOFOL N/A 12/15/2022   Procedure: COLONOSCOPY WITH PROPOFOL;  Surgeon: Sherrilyn Rist, MD;  Location: [facility] ENDOSCOPY;  Service: Gastroenterology;  Laterality: N/A;   CORONARY ARTERY BYPASS GRAFT N/A 06/05/2016   Procedure: CORONARY ARTERY BYPASS GRAFTING (CABG) x3 LIMA to LAD -SVG to OM -SVG to RCA;  Surgeon: Kerin Perna, MD;  Location: [facility]  OR;  Service: Open Heart Surgery;  Laterality: N/A;   CORONARY STENT INTERVENTION N/A 07/07/2017   Procedure: CORONARY STENT INTERVENTION;  Surgeon: Iran Ouch, MD;  [facility]: [facility];  Service: Cardiovascular;  Laterality: N/A;   CORONARY STENT INTERVENTION N/A 03/30/2018   Procedure: CORONARY STENT INTERVENTION;  Surgeon: Swazil[place], Peter M, MD;  Location: [facility] INVASIVE CV LAB;  Service: Cardiovascular;  Laterality: N/A;   ESOPHAGOGASTRODUODENOSCOPY (EGD) WITH PROPOFOL N/A 12/19/2020   Procedure: ESOPHAGOGASTRODUODENOSCOPY (EGD) WITH PROPOFOL;  Surgeon: Meryl Dare, MD;  Location: [facility] ENDOSCOPY;   Service: Endoscopy;  Laterality: N/A;   ESOPHAGOGASTRODUODENOSCOPY (EGD) WITH PROPOFOL N/A 12/15/2022   Procedure: ESOPHAGOGASTRODUODENOSCOPY (EGD) WITH PROPOFOL;  Surgeon: Sherrilyn Rist, MD;  Location: [facility] ENDOSCOPY;  Service: Gastroenterology;  Laterality: N/A;   EXCHANGE OF A DIALYSIS CATHETER Right 04/16/2022   Procedure: EXCHANGE OF A DIALYSIS CATHETER;  Surgeon: Leonie Douglas, MD;  Location: [facility] OR;  Service: Vascular;  Laterality: Right;   GIVENS CAPSULE STUDY N/A 12/20/2020   Procedure: GIVENS CAPSULE STUDY;  Surgeon: Napoleon Form, MD;  Location: [facility] ENDOSCOPY;  Service: Endoscopy;  Laterality: N/A;   HEMOSTASIS CLIP PLACEMENT  12/15/2022   Procedure: HEMOSTASIS CLIP PLACEMENT;  Surgeon: Sherrilyn Rist, MD;  Location: [facility] ENDOSCOPY;  Service: Gastroenterology;;   INSERTION OF DIALYSIS CATHETER N/A 06/05/2016   Procedure: INSERTION OF DIALYSIS/trialysis CATHETER;  Surgeon: Kerin Perna, MD;  Location: [facility] OR;  Service: Vascular;  Laterality: N/A;   INSERTION OF DIALYSIS CATHETER Right 06/13/2016   Procedure: INSERTION OF DIALYSIS CATHETER RIGHT INTERNAL JUGULAR;  Surgeon: Fransisco Hertz, MD;  Location: [facility] OR;  Service: Vascular;  Laterality: Right;   INSERTION OF DIALYSIS CATHETER Right 09/07/2019   Procedure: Insertion Of Dialysis Catheter;  Surgeon: Larina Earthly, MD;  Location: [facility] OR;  Service: Vascular;  Laterality: Right;   INSERTION OF DIALYSIS CATHETER N/A 04/09/2022   Procedure: INSERTION OF TUNNELED DIALYSIS CATHETER;  Surgeon: Leonie Douglas, MD;  Location: [facility] OR;  Service: Vascular;  Laterality: N/A;   INTRAOPERATIVE TRANSESOPHAGEAL ECHOCARDIOGRAM N/A 06/05/2016   Procedure: INTRAOPERATIVE TRANSESOPHAGEAL ECHOCARDIOGRAM;  Surgeon: Kerin Perna, MD;  Location: [facility] OR;  Service: Open Heart Surgery;  Laterality: N/A;   LEFT HEART CATH AND CORS/GRAFTS ANGIOGRAPHY N/A 11/03/2016   Procedure: Left Heart Cath and Cors/Grafts Angiography;  Surgeon: Lennette Bihari, MD;  Location:  [facility] CV [facility];  Service: Cardiovascular;  Laterality: N/A;   LEFT HEART CATH AND CORS/GRAFTS ANGIOGRAPHY N/A 07/07/2017   Procedure: LEFT HEART CATH AND CORS/GRAFTS ANGIOGRAPHY;  Surgeon: Laurey Morale, MD;  Location: [facility] INVASIVE CV LAB;  Service: Cardiovascular;  Laterality: N/A;   LEFT HEART CATH AND CORS/GRAFTS ANGIOGRAPHY N/A 03/30/2018   Procedure: LEFT HEART CATH AND CORS/GRAFTS ANGIOGRAPHY;  Surgeon: Swazil[place], Peter M, MD;  Location: [facility] INVASIVE CV LAB;  Service: Cardiovascular;  Laterality: N/A;   LEFT HEART CATH AND CORS/GRAFTS ANGIOGRAPHY N/A 04/17/2019   Procedure: LEFT HEART CATH AND CORS/GRAFTS ANGIOGRAPHY;  Surgeon: Laurey Morale, MD;  Location: [facility] INVASIVE CV LAB;  Service: Cardiovascular;  Laterality: N/A;   LUMBAR LAMINECTOMY/DECOMPRESSION MICRODISCECTOMY Bilateral 09/01/2022   Procedure: Laminectomy and Foraminotomy - bilateral - Lumbar three-Lumbar four;  Surgeon: Julio Sicks, MD;  Location: [facility] OR;  Service: Neurosurgery;  Laterality: Bilateral;   PERIPHERAL VASCULAR BALLOON ANGIOPLASTY Left 06/02/2022   Procedure: PERIPHERAL VASCULAR BALLOON ANGIOPLASTY;  Surgeon: Nada Libman, MD;  Location: [facility];  Service: Cardiovascular;  Laterality: Left;  PERIPHERAL VASCULAR INTERVENTION Left 03/06/2021   Procedure: PERIPHERAL VASCULAR INTERVENTION;  Surgeon: Cephus Shelling, MD;  Location: [facility] INVASIVE CV LAB;  Service: Cardiovascular;  Laterality: Left;   POLYPECTOMY  12/20/2020   Procedure: POLYPECTOMY;  Surgeon: Napoleon Form, MD;  Location: [facility] ENDOSCOPY;  Service: Endoscopy;;   POLYPECTOMY  12/15/2022   Procedure: POLYPECTOMY;  Surgeon: Sherrilyn Rist, MD;  Location: [facility] ENDOSCOPY;  Service: Gastroenterology;;   REVISION OF ARTERIOVENOUS GORETEX GRAFT Left 07/27/2019   Procedure: PLICATION OF ANEURYSM OF ARTERIOVENOUS FISTULA  LEFT ARM;  Surgeon: Larina Earthly, MD;  Location: [facility] OR;  Service: Vascular;  Laterality: Left;   REVISON OF ARTERIOVENOUS  FISTULA Left 09/07/2019   Procedure: REVISON OF LEFT UPPER ARM ARTERIOVENOUS FISTULA;  Surgeon: Larina Earthly, MD;  Location: [facility] OR;  Service: Vascular;  Laterality: Left;   REVISON OF ARTERIOVENOUS FISTULA Left 04/09/2022   Procedure: LEFT ARM FISTULA REVISION;  Surgeon: Leonie Douglas, MD;  Location: [facility] OR;  Service: Vascular;  Laterality: Left;  PERIPHERAL NERVE BLOCK   REVISON OF ARTERIOVENOUS FISTULA Left 04/16/2022   Procedure: REVISON OF ARTERIOVENOUS FISTULA;  Surgeon: Leonie Douglas, MD;  Location: [facility] OR;  Service: Vascular;  Laterality: Left;   RIGHT/LEFT HEART CATH AND CORONARY ANGIOGRAPHY N/A 01/03/2021   Procedure: RIGHT/LEFT HEART CATH AND CORONARY ANGIOGRAPHY;  Surgeon: Laurey Morale, MD;  Location: [facility] INVASIVE CV LAB;  Service: Cardiovascular;  Laterality: N/A;   Patient Active Problem List   Diagnosis Date Noted   Gastric intestinal metaplasia 12/15/2022   Benign neoplasm of ascending colon 12/15/2022   Benign neoplasm of ileocecal valve 12/15/2022   Porokeratosis 11/10/2022   Depression, recurrent (HCC) 10/06/2022   Lumbar stenosis with neurogenic claudication 09/01/2022   Chronic arthritis 08/11/2022   Tinea pedis 06/09/2022   Orthostatic hypotension 04/15/2022   Elevated troponin 04/15/2022   Complication associated with dialysis catheter 04/15/2022   Complication of AV dialysis fistula 04/15/2022   ESRD (end stage renal disease) (HCC) 04/09/2022   Pulmonary emphysema (HCC) 02/21/2022   Acute on chronic anemia 02/21/2022   Type 2 diabetes mellitus with hyperglycemia, with long-term current use of insulin (HCC) 10/28/2021   Type 2 diabetes mellitus with diabetic polyneuropathy, with long-term current use of insulin (HCC) 10/28/2021   Hypertension associated with diabetes (HCC) 09/16/2021   Chronic bilateral low back pain with bilateral sciatica 09/16/2021   Partial seizure disorder (HCC)    Acute respiratory failure with hypoxia (HCC) 07/28/2021   Prolonged QT  interval 01/01/2021   Hemoptysis 01/01/2021   Positive fecal occult blood test    Symptomatic bradycardia 12/15/2020   Postural dizziness with presyncope 12/15/2020   Blood in stool 12/15/2020   Acute metabolic encephalopathy 08/29/2019   Altered mental status 08/27/2019   Unresponsive episode 08/27/2019   AF (paroxysmal atrial fibrillation) (HCC) 08/27/2019   Status post coronary artery stent placement    NSTEMI (non-ST elevated myocardial infarction) (HCC) 03/30/2018   Hypertensive heart disease 09/12/2017   Hyperlipidemia associated with type 2 diabetes mellitus (HCC) 09/12/2017   Hyperkalemia 07/20/2017   Chronic diastolic CHF (congestive heart failure) (HCC) 04/14/2017   Hypertensive emergency 04/14/2017   Anemia, chronic renal failure 04/11/2017   Fluid overload 04/11/2017   Diabetic foot ulcer (HCC) 04/11/2017   Cellulitis in diabetic foot (HCC) 01/18/2017   Chest pain 12/16/2016   Hypertensive urgency 12/16/2016   Pressure injury of skin 11/03/2016   Type 2 diabetes mellitus with chronic kidney disease on chronic dialysis, with  long-term current use of insulin (HCC) 10/31/2016   ESRD (end stage renal disease) on dialysis (HCC) 10/30/2016   Atherosclerotic heart disease of native coronary artery with other forms of angina pectoris with elevated troponin     Gastroesophageal reflux disease 08/18/2016   HCAP (healthcare-associated pneumonia)    Non-ST elevation (NSTEMI) myocardial infarction [facility])    Pronation deformity of both feet 02/27/2015   Metatarsal deformity 02/27/2015   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   Ischemic rest pain of lower extremity (HCC) 02/06/2015    PCP: Sharlene Dory, DO   REFERRING PROVIDER: Julio Sicks, MD   REFERRING DIAG: [phone] (ICD-10-CM) - Spinal stenosis, lumbar region with neurogenic claudication   Rationale for Evaluation and Treatment: Rehabilitation  THERAPY DIAG:  Radiculopathy, lumbar region  Cramp and  spasm  Muscle weakness (generalized)  ONSET DATE: 3 weeks  SUBJECTIVE:                                                                                                                                                                                           SUBJECTIVE STATEMENT: Better overall. Better in legs. Morning and night the worst in the back.   [place] Interpreter: Brantley Stage   PERTINENT HISTORY:  T2DM, CKD on dialysis, CHF, L3/4 discectomy  PAIN:  Are you having pain? Yes: NPRS scale: 4-5/10 Pain location: low back  Pain description: pain in back Aggravating factors: everything Relieving factors: nothing  PRECAUTIONS: None  WEIGHT BEARING RESTRICTIONS: No  FALLS:  Has patient fallen in last 6 months? No  LIVING ENVIRONMENT: Lives with: lives with their family Lives in: House/apartment Stairs: Yes: Internal: 13 steps; on left going up and External: 5 steps; on left going up Has following equipment at home: Single point cane, Walker - 2 wheeled, and Environmental consultant - 4 wheeled  OCCUPATION: retired  PLOF: Independent with household mobility with device  PATIENT GOALS: get rid of pain  NEXT MD VISIT: 2 weeks now  OBJECTIVE:   DIAGNOSTIC FINDINGS:  MRI 10/26/22 IMPRESSION: 1. Interval posterior decompression and discectomy at L3-4. There is progressive loss of disc height with probable intradiscal vacuum phenomenon and a broad-based central and right-sided disc protrusion which appears slightly increased from the preoperative study. There is progressive mass effect on the thecal sac which is incompletely decompressed posteriorly. Right foraminal narrowing appears slightly worse with probable right L3 nerve root encroachment. 2. No other significant changes are identified. Multilevel spondylosis superimposed on congenitally short pedicles with resulting chronic multifactorial spinal stenosis from L1-2 through L4-5. 3. Reactive endplate edema at E4-5 without  evidence of discitis/osteomyelitis.  PATIENT SURVEYS:  Modified Oswestry  40 / 50 = 80.0 %   SCREENING FOR RED FLAGS: Bowel or bladder incontinence: No Spinal tumors: No Cauda equina syndrome: No Compression fracture: No Abdominal aneurysm: No  COGNITION: Overall cognitive status: Within functional limits for tasks assessed     SENSATION: WFL  MUSCLE LENGTH: HS: tight bil L> R Piriformis: bil L> R Hip Flexors: NT, but likely tight due to posture in standing Heelcords: WFL   POSTURE: rounded shoulders, forward head, decreased lumbar lordosis, increased thoracic kyphosis, and posterior pelvic tilt  PALPATION: Palpation: TTP at Bil gluteals L >R, bil lumbar.  Spinal mobility NT due to patient's pain level.  LUMBAR ROM:   AROM eval 01/04/23 01/11/23  Flexion Hands to knees Hands to mid shins Hands to mid shins  Extension Unable to get to neutral - bends knees to extend backward Painful when approaches neutral spine Painful when approaches neutral spine  Right lateral flexion 80% limited [facility] WFL  Left lateral flexion 90% limited [facility] WFL  Right rotation 75% limited 50% limited pain  50% limited  Left rotation 80% limited 25% limited  25% limited   (Blank rows = not tested)  LOWER EXTREMITY ROM:   WFL for MMT, hip extension not tested but decreased based on patient's inability to stand in neutral.  LOWER EXTREMITY MMT:    MMT Right eval Left eval Right 01/04/23 Left 01/04/23 Right 01/11/23 Left 01/11/23  Hip flexion 4 4- 4 4+ 5 5  Hip extension 4-* 4-* 4+ 4+ 5 4+  Hip abduction 5 4+ 5 4+ 5 5  Hip adduction 4 4- 5 4 5  4+  Hip internal rotation        Hip external rotation        Knee flexion 5 4+ 5 4+    Knee extension 4+ 4 4+ 4+ 5 4+  Ankle dorsiflexion 5 5 5  4+ 5 5   (Blank rows = not tested)   FUNCTIONAL TESTS:  30 seconds chair stand test: 12/09/22: score is 0 - able to do 3x w/o hands but used hands 1x; 01/07/23  12 reps no hands  GAIT: Distance walked:  40 Assistive device utilized: Walker - 4 wheeled Level of assistance: Complete Independence Comments: flexed trunk with walker too far ahead of him  TODAY'S TREATMENT:                                                                                                                              DATE: 01/18/23 Therapeutic Exercise: to improve flexibility, strength and mobility.  Verbal and tactile cues throughout for technique Nustep L5 x 6 min Seated trunk extension BTB x 10 cues to retract shoulder blades Wall sit with OH shoulder flexion x 10 Childs pose at sink for lat stretch x 10 Row machine 25# x 10 Standing reciprocal arm swing x 10 B Knee ext 15# x 10 S/L  hip ABD x 10, then front and back taps x 10 R  side only Applied TENS for pt - one channel, two electrodes to lumbar spine (interpreter not present for education) x 10 min with [facility]  01/13/23 Therapeutic Exercise: to improve flexibility, strength and mobility.  Verbal and tactile cues throughout for technique Nustep L5 x 6 min Review of TENS device with patient Standing thoracic extension at wall with shld flexion up wall x 10 Lumbar extension at row machine 25# x 10 Standing reciprocal arm swing x 10 B S/L  clamshells 20x RTB both S/L reverse clamshells 20x both   4/29/224 Therapeutic Exercise: to improve flexibility, strength and mobility.  Verbal and tactile cues throughout for technique Nustep L5 x 6 min Seated trunk extension BTB x 10 cues to retract shoulder blades S/L  hip ADD with ball 20x both S/L  clamshells 15x RTB both S/L reverse clamshells 15x both S/L  ABD x 15 RTB both  Therapeutic Activities: MMT, ROM, mod oswestry, goals assessed  MHP to low back at end of session x 10 min  01/06/23 Therapeutic Exercise: to improve flexibility, strength and mobility.  Verbal and tactile cues throughout for technique Nustep L5 x 7 min Lat pull 10# x 10, 20# x 20 Rows 20# x30 (2 x 15) - cues to keep shoulders down and  retract (25# too much today) Seated trunk extension BTB x 10 S/L  hip ADD with ball 10x both S/L  clamshells 15x RTB both S/L reverse clamshells 15x both S/L  ABD x 15 RTB both  Modalities: TENS trial to lumbar spine x 10 min   PATIENT EDUCATION:  Education details: HEP update  Person educated: Patient Education method: Explanation, Demonstration, Tactile cues, Verbal cues, and Handouts Education comprehension: verbalized understanding, returned demonstration, verbal cues required, and needs further education  HOME EXERCISE PROGRAM: Access Code: [email or web address] URL: [email or web address] Date: 12/30/2022 Prepared by: Verta Ellen  Exercises - Seated Hamstring Stretch with Chair  - 2 x daily - 7 x weekly - 1 sets - 3 reps - 20-30 sec hold - Seated Figure 4 Piriformis Stretch  - 2 x daily - 7 x weekly - 1 sets - 3 reps - 30-60 sec hold - Seated Trunk Rotation  - 1 x daily - 7 x weekly - 1 sets - 10 reps - 10 sec hold - Seated Thoracic Extension Arms Overhead  - 1 x daily - 7 x weekly - 2 sets - 10 reps - Seated Pelvic Tilt  - 1 x daily - 7 x weekly - 2 sets - 10 reps - Seated Diagonal Chops with Medicine Ball  - 1 x daily - 7 x weekly - 2 sets - 10 reps - Seated Shoulder Horizontal Abduction with Resistance - Palms Down  - 1 x daily - 7 x weekly - 2 sets - 10 reps - Seated Hip Flexor Stretch  - 2 x daily - 7 x weekly - 1 sets - 3 reps - 30-60 sec hold - Abdominal Press into [place]  - 1 x daily - 7 x weekly - 2 sets - 10 reps - 5 sec hold - Clamshell  - 1 x daily - 7 x weekly - 1-3 sets - 10 reps - Sidelying Reverse Clamshell  - 1 x daily - 7 x weekly - 1-3 sets - 10 reps - Sidelying Hip Adduction Isometric with Ball  - 1 x daily - 7 x weekly - 1-3 sets - 10 reps - 5 sec hold hold  ASSESSMENT:  CLINICAL IMPRESSION: Davonta reports improvements overall in pain.  He has less pain in the legs than previously, but too much lumbar extension does cause radicular pain. He has increased  tolerance to most of his exercises, but I also think that he pushes ahead even with pain if not watched carefully. TENs was applied at end of session for 10 min with MHP. Patient does not use at home at this time. He needs to be educated through interpreter in order to use at home independently.   OBJECTIVE IMPAIRMENTS: decreased activity tolerance, difficulty walking, decreased ROM, decreased strength, increased muscle spasms, impaired flexibility, postural dysfunction, and pain.   ACTIVITY LIMITATIONS: carrying, lifting, bending, sitting, standing, squatting, sleeping, stairs, transfers, bed mobility, hygiene/grooming, and locomotion level  PARTICIPATION LIMITATIONS:  all ADLs limited  PERSONAL FACTORS: Time since onset of injury/illness/exacerbation and 3+ comorbidities: T2DM, CKD on dialysis, CHF, L3/4 discectomy  are also affecting patient's functional outcome.   REHAB POTENTIAL: Good  CLINICAL DECISION MAKING: Evolving/moderate complexity  EVALUATION COMPLEXITY: Moderate   GOALS: Goals reviewed with patient? Yes  SHORT TERM GOALS: Target date: 01/04/2023   Patient will be independent with initial HEP.  Baseline:  Goal status: MET  2.  Patient will report decreased pain by 25%.  Baseline:  Goal status: IN PROGRESS - 12/28/22 20%  LONG TERM GOALS: Target date: 02/01/2023    Patient will be independent with advanced/ongoing HEP to improve outcomes and carryover.  Baseline:  Goal status: IN PROGRESS 01/11/23 I with current HEP  2.  Patient will report 50-75% improvement in low back and leg pain to improve QOL.  Baseline:  Goal status: IN PROGRESS 01/11/23  6% improvement in pain; however he reports his endurance for walking has improved.  3.  Patient will demonstrate functional lumbar ROM to perform ADLs.   Baseline:  Goal status: IN PROGRESS  01/11/23 limited in extension and R rotation by pain  4.  Patient will demonstrate improved functional strength by increased 30 second  chair stand test by 5. Baseline: 0 Goal status: MET 12 reps  5.  Patient will report 34 on modified oswestry to demonstrate improved functional ability.  Baseline:  40 / 50 = 80.0 % Goal status: MET 01/11/23  34 / 50 = 68.0 %  6.  Patient will report ability to sleep a minimum of 5 hours without waking from pain. Baseline:  Goal status: IN PROGRESS  01/11/23 Still limited to 3-4 hours     PLAN:  PT FREQUENCY: 2x/week  PT DURATION: 8 weeks  PLANNED INTERVENTIONS: Therapeutic exercises, Therapeutic activity, Neuromuscular re-education, Balance training, Gait training, Patient/Family education, Self Care, Joint mobilization, Aquatic Therapy, Dry Needling, Electrical stimulation, Spinal mobilization, Cryotherapy, Moist heat, scar mobilization, Taping, Ultrasound, Ionotophoresis 4mg /ml Dexamethasone, and Manual therapy.  PLAN FOR NEXT SESSION:  continue core and LE strengthening as tolerated. TENS education with interpreter.   Erendida Wrenn, PT 01/18/2023, 2:48 PM

## 2023-01-18 ENCOUNTER — Encounter: Payer: Self-pay | Admitting: Physical Therapy

## 2023-01-18 ENCOUNTER — Ambulatory Visit: Payer: 59 | Admitting: Physical Therapy

## 2023-01-18 DIAGNOSIS — M5416 Radiculopathy, lumbar region: Secondary | ICD-10-CM

## 2023-01-18 DIAGNOSIS — R252 Cramp and spasm: Secondary | ICD-10-CM

## 2023-01-18 DIAGNOSIS — M6281 Muscle weakness (generalized): Secondary | ICD-10-CM

## 2023-01-20 ENCOUNTER — Ambulatory Visit: Payer: 59

## 2023-01-20 DIAGNOSIS — M5416 Radiculopathy, lumbar region: Secondary | ICD-10-CM

## 2023-01-20 DIAGNOSIS — M6281 Muscle weakness (generalized): Secondary | ICD-10-CM

## 2023-01-20 DIAGNOSIS — R252 Cramp and spasm: Secondary | ICD-10-CM

## 2023-01-20 NOTE — Therapy (Signed)
OUTPATIENT PHYSICAL THERAPY TREATMENT       Patient Name: Kenneth [name] MRN: [id number] DOB:10-Feb-1956, 67 y.o., male Today's Date: 01/20/2023  END OF SESSION:  PT End of Session - 01/20/23 1445     Visit Number 13    Date for PT Re-Evaluation 02/01/23    Authorization Type [employer] MCR    Progress Note Due on Visit 20    PT Start Time 1403    PT Stop Time 1444    PT Time Calculation (min) 41 min    Activity Tolerance Patient tolerated treatment well    Behavior During Therapy [facility] for tasks assessed/performed                  Past Medical History:  Diagnosis Date   Arthritis    hands   CAD (coronary artery disease) 10/30/2016   NSTEMI 9/17 with CABG x 3 (LIMA-LAD, SVG-OM, SVG-RCA).   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   type 1 DM   Dyspnea    with exertion   ESRD on hemodialysis (HCC)    "MWF; [place]" (03/30/2018)   Gastroesophageal reflux disease 08/18/2016   Hyperlipidemia    Hypertension    Ischemic rest pain of lower extremity 02/06/2015   Keratoma 02/27/2015   Metatarsal deformity 02/27/2015   PAF (paroxysmal atrial fibrillation) (HCC)    Pronation deformity of both feet 02/27/2015   Past Surgical History:  Procedure Laterality Date   A/V FISTULAGRAM Left 03/06/2021   Procedure: A/V FISTULAGRAM;  Surgeon: Cephus Shelling, MD;  [facility]: [facility] INVASIVE CV LAB;  Service: Cardiovascular;  Laterality: Left;   A/V FISTULAGRAM Left 06/02/2022   Procedure: A/V Fistulagram;  Surgeon: Nada Libman, MD;  Location: [facility];  Service: Cardiovascular;  Laterality: Left;   AV FISTULA PLACEMENT Left 06/22/2016   Procedure: ARTERIOVENOUS (AV) FISTULA CREATION LEFT UPPER ARM;  Surgeon: Larina Earthly, MD;  Location: [facility] OR;  Service: Vascular;  Laterality: Left;   BIOPSY  12/19/2020   Procedure: BIOPSY;  Surgeon:  Meryl Dare, MD;  Location: [facility] ENDOSCOPY;  Service: Endoscopy;;   BIOPSY  12/15/2022   Procedure: BIOPSY;  Surgeon: Sherrilyn Rist, MD;  Location: [facility] ENDOSCOPY;  Service: Gastroenterology;;   CARDIAC CATHETERIZATION N/A 05/31/2016   Procedure: Left Heart Cath and Coronary Angiography;  Surgeon: Corky Crafts, MD;  Location: [facility] INVASIVE CV LAB;  Service: Cardiovascular;  Laterality: N/A;   CARDIAC CATHETERIZATION N/A 05/31/2016   Procedure: Right Heart Cath;  Surgeon: Corky Crafts, MD;  Location: [facility] INVASIVE CV LAB;  Service: Cardiovascular;  Laterality: N/A;   CARDIAC CATHETERIZATION N/A 05/31/2016   Procedure: IABP Insertion;  Surgeon: Corky Crafts, MD;  [facility]: [facility];  Service: Cardiovascular;  Laterality: N/A;   COLONOSCOPY WITH PROPOFOL N/A 12/20/2020   Procedure: COLONOSCOPY WITH PROPOFOL;  Surgeon: Napoleon Form, MD;  Location: [facility] ENDOSCOPY;  Service: Endoscopy;  Laterality: N/A;   COLONOSCOPY WITH PROPOFOL N/A 12/15/2022   Procedure: COLONOSCOPY WITH PROPOFOL;  Surgeon: Sherrilyn Rist, MD;  Location: [facility] ENDOSCOPY;  Service: Gastroenterology;  Laterality: N/A;   CORONARY ARTERY BYPASS GRAFT N/A 06/05/2016   Procedure: CORONARY ARTERY BYPASS GRAFTING (CABG) x3 LIMA to LAD -SVG to OM -SVG to RCA;  Surgeon: Kerin Perna, MD;  Location:  [facility] OR;  Service: Open Heart Surgery;  Laterality: N/A;   CORONARY STENT INTERVENTION N/A 07/07/2017   Procedure: CORONARY STENT INTERVENTION;  Surgeon: Iran Ouch, MD;  [facility] LAB;  Service: Cardiovascular;  Laterality: N/A;   CORONARY STENT INTERVENTION N/A 03/30/2018   Procedure: CORONARY STENT INTERVENTION;  Surgeon: Swazil[place], Peter M, MD;  Location: [facility] INVASIVE CV LAB;  Service: Cardiovascular;  Laterality: N/A;   ESOPHAGOGASTRODUODENOSCOPY (EGD) WITH PROPOFOL N/A 12/19/2020   Procedure: ESOPHAGOGASTRODUODENOSCOPY (EGD) WITH PROPOFOL;  Surgeon: Meryl Dare, MD;  Location: [facility] ENDOSCOPY;   Service: Endoscopy;  Laterality: N/A;   ESOPHAGOGASTRODUODENOSCOPY (EGD) WITH PROPOFOL N/A 12/15/2022   Procedure: ESOPHAGOGASTRODUODENOSCOPY (EGD) WITH PROPOFOL;  Surgeon: Sherrilyn Rist, MD;  Location: [facility] ENDOSCOPY;  Service: Gastroenterology;  Laterality: N/A;   EXCHANGE OF A DIALYSIS CATHETER Right 04/16/2022   Procedure: EXCHANGE OF A DIALYSIS CATHETER;  Surgeon: Leonie Douglas, MD;  Location: [facility] OR;  Service: Vascular;  Laterality: Right;   GIVENS CAPSULE STUDY N/A 12/20/2020   Procedure: GIVENS CAPSULE STUDY;  Surgeon: Napoleon Form, MD;  Location: [facility] ENDOSCOPY;  Service: Endoscopy;  Laterality: N/A;   HEMOSTASIS CLIP PLACEMENT  12/15/2022   Procedure: HEMOSTASIS CLIP PLACEMENT;  Surgeon: Sherrilyn Rist, MD;  Location: [facility] ENDOSCOPY;  Service: Gastroenterology;;   INSERTION OF DIALYSIS CATHETER N/A 06/05/2016   Procedure: INSERTION OF DIALYSIS/trialysis CATHETER;  Surgeon: Kerin Perna, MD;  Location: [facility] OR;  Service: Vascular;  Laterality: N/A;   INSERTION OF DIALYSIS CATHETER Right 06/13/2016   Procedure: INSERTION OF DIALYSIS CATHETER RIGHT INTERNAL JUGULAR;  Surgeon: Fransisco Hertz, MD;  Location: [facility] OR;  Service: Vascular;  Laterality: Right;   INSERTION OF DIALYSIS CATHETER Right 09/07/2019   Procedure: Insertion Of Dialysis Catheter;  Surgeon: Larina Earthly, MD;  Location: [facility] OR;  Service: Vascular;  Laterality: Right;   INSERTION OF DIALYSIS CATHETER N/A 04/09/2022   Procedure: INSERTION OF TUNNELED DIALYSIS CATHETER;  Surgeon: Leonie Douglas, MD;  Location: [facility] OR;  Service: Vascular;  Laterality: N/A;   INTRAOPERATIVE TRANSESOPHAGEAL ECHOCARDIOGRAM N/A 06/05/2016   Procedure: INTRAOPERATIVE TRANSESOPHAGEAL ECHOCARDIOGRAM;  Surgeon: Kerin Perna, MD;  Location: [facility] OR;  Service: Open Heart Surgery;  Laterality: N/A;   LEFT HEART CATH AND CORS/GRAFTS ANGIOGRAPHY N/A 11/03/2016   Procedure: Left Heart Cath and Cors/Grafts Angiography;  Surgeon: Lennette Bihari, MD;  [facility];  Service: Cardiovascular;  Laterality: N/A;   LEFT HEART CATH AND CORS/GRAFTS ANGIOGRAPHY N/A 07/07/2017   Procedure: LEFT HEART CATH AND CORS/GRAFTS ANGIOGRAPHY;  Surgeon: Laurey Morale, MD;  Location: [facility] INVASIVE CV LAB;  Service: Cardiovascular;  Laterality: N/A;   LEFT HEART CATH AND CORS/GRAFTS ANGIOGRAPHY N/A 03/30/2018   Procedure: LEFT HEART CATH AND CORS/GRAFTS ANGIOGRAPHY;  Surgeon: [place]azil[place], Peter M, MD;  Location: [facility] INVASIVE CV LAB;  Service: Cardiovascular;  Laterality: N/A;   LEFT HEART CATH AND CORS/GRAFTS ANGIOGRAPHY N/A 04/17/2019   Procedure: LEFT HEART CATH AND CORS/GRAFTS ANGIOGRAPHY;  Surgeon: Laurey Morale, MD;  Location: [facility] INVASIVE CV LAB;  Service: Cardiovascular;  Laterality: N/A;   LUMBAR LAMINECTOMY/DECOMPRESSION MICRODISCECTOMY Bilateral 09/01/2022   Procedure: Laminectomy and Foraminotomy - bilateral - Lumbar three-Lumbar four;  Surgeon: Julio Sicks, MD;  Location[facility] OR;  Service: Neurosurgery;  Laterality: Bilateral;   PERIPHERAL VASCULAR BALLOON ANGIOPLASTY Left 06/02/2022   Procedure: PERIPHERAL VASCULAR BALLOON ANGIOPLASTY;  Surgeon: Nada Libman, MD;  [facility]: [facility];  Service: Cardiovascular;  Laterality: Left;  PERIPHERAL VASCULAR INTERVENTION Left 03/06/2021   Procedure: PERIPHERAL VASCULAR INTERVENTION;  Surgeon: Cephus Shelling, MD;  Location: [facility] INVASIVE CV LAB;  Service: Cardiovascular;  Laterality: Left;   POLYPECTOMY  12/20/2020   Procedure: POLYPECTOMY;  Surgeon: Napoleon Form, MD;  Location: [facility] ENDOSCOPY;  Service: Endoscopy;;   POLYPECTOMY  12/15/2022   Procedure: POLYPECTOMY;  Surgeon: Sherrilyn Rist, MD;  Location: [facility] ENDOSCOPY;  Service: Gastroenterology;;   REVISION OF ARTERIOVENOUS GORETEX GRAFT Left 07/27/2019   Procedure: PLICATION OF ANEURYSM OF ARTERIOVENOUS FISTULA  LEFT ARM;  Surgeon: Larina Earthly, MD;  Location: [facility] OR;  Service: Vascular;  Laterality: Left;   REVISON OF ARTERIOVENOUS  FISTULA Left 09/07/2019   Procedure: REVISON OF LEFT UPPER ARM ARTERIOVENOUS FISTULA;  Surgeon: Larina Earthly, MD;  Location: [facility] OR;  Service: Vascular;  Laterality: Left;   REVISON OF ARTERIOVENOUS FISTULA Left 04/09/2022   Procedure: LEFT ARM FISTULA REVISION;  Surgeon: Leonie Douglas, MD;  Location: [facility] OR;  Service: Vascular;  Laterality: Left;  PERIPHERAL NERVE BLOCK   REVISON OF ARTERIOVENOUS FISTULA Left 04/16/2022   Procedure: REVISON OF ARTERIOVENOUS FISTULA;  Surgeon: Leonie Douglas, MD;  Location: [facility] OR;  Service: Vascular;  Laterality: Left;   RIGHT/LEFT HEART CATH AND CORONARY ANGIOGRAPHY N/A 01/03/2021   Procedure: RIGHT/LEFT HEART CATH AND CORONARY ANGIOGRAPHY;  Surgeon: Laurey Morale, MD;  Location: [facility] INVASIVE CV LAB;  Service: Cardiovascular;  Laterality: N/A;   Patient Active Problem List   Diagnosis Date Noted   Gastric intestinal metaplasia 12/15/2022   Benign neoplasm of ascending colon 12/15/2022   Benign neoplasm of ileocecal valve 12/15/2022   Porokeratosis 11/10/2022   Depression, recurrent (HCC) 10/06/2022   Lumbar stenosis with neurogenic claudication 09/01/2022   Chronic arthritis 08/11/2022   Tinea pedis 06/09/2022   Orthostatic hypotension 04/15/2022   Elevated troponin 04/15/2022   Complication associated with dialysis catheter 04/15/2022   Complication of AV dialysis fistula 04/15/2022   ESRD (end stage renal disease) (HCC) 04/09/2022   Pulmonary emphysema (HCC) 02/21/2022   Acute on chronic anemia 02/21/2022   Type 2 diabetes mellitus with hyperglycemia, with long-term current use of insulin (HCC) 10/28/2021   Type 2 diabetes mellitus with diabetic polyneuropathy, with long-term current use of insulin (HCC) 10/28/2021   Hypertension associated with diabetes (HCC) 09/16/2021   Chronic bilateral low back pain with bilateral sciatica 09/16/2021   Partial seizure disorder (HCC)    Acute respiratory failure with hypoxia (HCC) 07/28/2021   Prolonged QT  interval 01/01/2021   Hemoptysis 01/01/2021   Positive fecal occult blood test    Symptomatic bradycardia 12/15/2020   Postural dizziness with presyncope 12/15/2020   Blood in stool 12/15/2020   Acute metabolic encephalopathy 08/29/2019   Altered mental status 08/27/2019   Unresponsive episode 08/27/2019   AF (paroxysmal atrial fibrillation) (HCC) 08/27/2019   Status post coronary artery stent placement    NSTEMI (non-ST elevated myocardial infarction) (HCC) 03/30/2018   Hypertensive heart disease 09/12/2017   Hyperlipidemia associated with type 2 diabetes mellitus (HCC) 09/12/2017   Hyperkalemia 07/20/2017   Chronic diastolic CHF (congestive heart failure) (HCC) 04/14/2017   Hypertensive emergency 04/14/2017   Anemia, chronic renal failure 04/11/2017   Fluid overload 04/11/2017   Diabetic foot ulcer (HCC) 04/11/2017   Cellulitis in diabetic foot (HCC) 01/18/2017   Chest pain 12/16/2016   Hypertensive urgency 12/16/2016   Pressure injury of skin 11/03/2016   Type 2 diabetes mellitus with chronic kidney disease on chronic dialysis, with  long-term current use of insulin (HCC) 10/31/2016   ESRD (end stage renal disease) on dialysis (HCC) 10/30/2016   Atherosclerotic heart disease of native coronary artery with other forms of angina pectoris with elevated troponin     Gastroesophageal reflux disease 08/18/2016   HCAP (healthcare-associated pneumonia)    Non-ST elevation (NSTEMI) myocardial infarction [facility])    Pronation deformity of both feet 02/27/2015   Metatarsal deformity 02/27/2015   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   Ischemic rest pain of lower extremity (HCC) 02/06/2015    PCP: Sharlene Dory, DO   REFERRING PROVIDER: Julio Sicks, MD   REFERRING DIAG: [phone] (ICD-10-CM) - Spinal stenosis, lumbar region with neurogenic claudication   Rationale for Evaluation and Treatment: Rehabilitation  THERAPY DIAG:  Radiculopathy, lumbar region  Cramp and  spasm  Muscle weakness (generalized)  ONSET DATE: 3 weeks  SUBJECTIVE:                                                                                                                                                                                           SUBJECTIVE STATEMENT: Pain is better, the TENS machine works OK.   [place] Interpreter: Brantley Stage   PERTINENT HISTORY:  T2DM, CKD on dialysis, CHF, L3/4 discectomy  PAIN:  Are you having pain? Yes: NPRS scale: 4-5/10 Pain location: low back  Pain description: pain in back Aggravating factors: everything Relieving factors: nothing  PRECAUTIONS: None  WEIGHT BEARING RESTRICTIONS: No  FALLS:  Has patient fallen in last 6 months? No  LIVING ENVIRONMENT: Lives with: lives with their family Lives in: House/apartment Stairs: Yes: Internal: 13 steps; on left going up and External: 5 steps; on left going up Has following equipment at home: Single point cane, Walker - 2 wheeled, and Environmental consultant - 4 wheeled  OCCUPATION: retired  PLOF: Independent with household mobility with device  PATIENT GOALS: get rid of pain  NEXT MD VISIT: 2 weeks now  OBJECTIVE:   DIAGNOSTIC FINDINGS:  MRI 10/26/22 IMPRESSION: 1. Interval posterior decompression and discectomy at L3-4. There is progressive loss of disc height with probable intradiscal vacuum phenomenon and a broad-based central and right-sided disc protrusion which appears slightly increased from the preoperative study. There is progressive mass effect on the thecal sac which is incompletely decompressed posteriorly. Right foraminal narrowing appears slightly worse with probable right L3 nerve root encroachment. 2. No other significant changes are identified. Multilevel spondylosis superimposed on congenitally short pedicles with resulting chronic multifactorial spinal stenosis from L1-2 through L4-5. 3. Reactive endplate edema at E4-5 without evidence  of discitis/osteomyelitis.  PATIENT SURVEYS:  Modified Oswestry  40 / 50 = 80.0 %  SCREENING FOR RED FLAGS: Bowel or bladder incontinence: No Spinal tumors: No Cauda equina syndrome: No Compression fracture: No Abdominal aneurysm: No  COGNITION: Overall cognitive status: Within functional limits for tasks assessed     SENSATION: WFL  MUSCLE LENGTH: HS: tight bil L> R Piriformis: bil L> R Hip Flexors: NT, but likely tight due to posture in standing Heelcords: WFL   POSTURE: rounded shoulders, forward head, decreased lumbar lordosis, increased thoracic kyphosis, and posterior pelvic tilt  PALPATION: Palpation: TTP at Bil gluteals L >R, bil lumbar.  Spinal mobility NT due to patient's pain level.  LUMBAR ROM:   AROM eval 01/04/23 01/11/23  Flexion Hands to knees Hands to mid shins Hands to mid shins  Extension Unable to get to neutral - bends knees to extend backward Painful when approaches neutral spine Painful when approaches neutral spine  Right lateral flexion 80% limited [facility] WFL  Left lateral flexion 90% limited [facility] WFL  Right rotation 75% limited 50% limited pain  50% limited  Left rotation 80% limited 25% limited  25% limited   (Blank rows = not tested)  LOWER EXTREMITY ROM:   WFL for MMT, hip extension not tested but decreased based on patient's inability to stand in neutral.  LOWER EXTREMITY MMT:    MMT Right eval Left eval Right 01/04/23 Left 01/04/23 Right 01/11/23 Left 01/11/23  Hip flexion 4 4- 4 4+ 5 5  Hip extension 4-* 4-* 4+ 4+ 5 4+  Hip abduction 5 4+ 5 4+ 5 5  Hip adduction 4 4- 5 4 5  4+  Hip internal rotation        Hip external rotation        Knee flexion 5 4+ 5 4+    Knee extension 4+ 4 4+ 4+ 5 4+  Ankle dorsiflexion 5 5 5  4+ 5 5   (Blank rows = not tested)   FUNCTIONAL TESTS:  30 seconds chair stand test: 12/09/22: score is 0 - able to do 3x w/o hands but used hands 1x; 01/07/23  12 reps no hands  GAIT: Distance walked: 40 Assistive  device utilized: Walker - 4 wheeled Level of assistance: Complete Independence Comments: flexed trunk with walker too far ahead of him  TODAY'S TREATMENT:                                                                                                                              DATE: 01/20/23 Therapeutic Exercise: to improve flexibility, strength and mobility.  Verbal and tactile cues throughout for technique Nustep L5 x 6 min Standing wall wash x10 Standing trunk extension at wall x 10 Seated thoracic extension with B arm raise x 10 Seated horizontal ABD red TB x 10  Seated external rotation red TB x 10 Applied TENS device: Knee flexion 10# BLE 2x10  01/18/23 Therapeutic Exercise: to improve flexibility, strength and mobility.  Verbal and tactile cues throughout for technique Nustep L5 x 6 min Seated  trunk extension BTB x 10 cues to retract shoulder blades Wall sit with OH shoulder flexion x 10 Childs pose at sink for lat stretch x 10 Row machine 25# x 10 Standing reciprocal arm swing x 10 B Knee ext 15# x 10 S/L  hip ABD x 10, then front and back taps x 10 R side only Applied TENS for pt - one channel, two electrodes to lumbar spine (interpreter not present for education) x 10 min with MHP  01/13/23 Therapeutic Exercise: to improve flexibility, strength and mobility.  Verbal and tactile cues throughout for technique Nustep L5 x 6 min Review of TENS device with patient Standing thoracic extension at wall with shld flexion up wall x 10 Lumbar extension at row machine 25# x 10 Standing reciprocal arm swing x 10 B S/L  clamshells 20x RTB both S/L reverse clamshells 20x both   4/29/224 Therapeutic Exercise: to improve flexibility, strength and mobility.  Verbal and tactile cues throughout for technique Nustep L5 x 6 min Seated trunk extension BTB x 10 cues to retract shoulder blades S/L  hip ADD with ball 20x both S/L  clamshells 15x RTB both S/L reverse clamshells 15x both S/L   ABD x 15 RTB both  Therapeutic Activities: MMT, ROM, mod oswestry, goals assessed  MHP to low back at end of session x 10 min  01/06/23 Therapeutic Exercise: to improve flexibility, strength and mobility.  Verbal and tactile cues throughout for technique Nustep L5 x 7 min Lat pull 10# x 10, 20# x 20 Rows 20# x30 (2 x 15) - cues to keep shoulders down and retract (25# too much today) Seated trunk extension BTB x 10 S/L  hip ADD with ball 10x both S/L  clamshells 15x RTB both S/L reverse clamshells 15x both S/L  ABD x 15 RTB both  Modalities: TENS trial to lumbar spine x 10 min   PATIENT EDUCATION:  Education details: HEP update  Person educated: Patient Education method: Explanation, Demonstration, Tactile cues, Verbal cues, and Handouts Education comprehension: verbalized understanding, returned demonstration, verbal cues required, and needs further education  HOME EXERCISE PROGRAM: Access Code: [email or web address] URL: [email or web address] Date: 12/30/2022 Prepared by: Verta Ellen  Exercises - Seated Hamstring Stretch with Chair  - 2 x daily - 7 x weekly - 1 sets - 3 reps - 20-30 sec hold - Seated Figure 4 Piriformis Stretch  - 2 x daily - 7 x weekly - 1 sets - 3 reps - 30-60 sec hold - Seated Trunk Rotation  - 1 x daily - 7 x weekly - 1 sets - 10 reps - 10 sec hold - Seated Thoracic Extension Arms Overhead  - 1 x daily - 7 x weekly - 2 sets - 10 reps - Seated Pelvic Tilt  - 1 x daily - 7 x weekly - 2 sets - 10 reps - Seated Diagonal Chops with Medicine Ball  - 1 x daily - 7 x weekly - 2 sets - 10 reps - Seated Shoulder Horizontal Abduction with Resistance - Palms Down  - 1 x daily - 7 x weekly - 2 sets - 10 reps - Seated Hip Flexor Stretch  - 2 x daily - 7 x weekly - 1 sets - 3 reps - 30-60 sec hold - Abdominal Press into [place]  - 1 x daily - 7 x weekly - 2 sets - 10 reps - 5 sec hold - Clamshell  - 1 x daily - 7 x weekly - 1-3 sets - 10  reps - Sidelying Reverse  Clamshell  - 1 x daily - 7 x weekly - 1-3 sets - 10 reps - Sidelying Hip Adduction Isometric with Ball  - 1 x daily - 7 x weekly - 1-3 sets - 10 reps - 5 sec hold hold  ASSESSMENT:  CLINICAL IMPRESSION: Continued to focus on improving upright posture through exercises that promote trunk extension. Pt still has a lot of pain with full trunk extension. Gave him more instruction on TENS device with interpreter present today. He was able to complete interventions with less pain while using the TENS device and he was assured it was ok to apply it when doing daily activities to improve tolerance.    OBJECTIVE IMPAIRMENTS: decreased activity tolerance, difficulty walking, decreased ROM, decreased strength, increased muscle spasms, impaired flexibility, postural dysfunction, and pain.   ACTIVITY LIMITATIONS: carrying, lifting, bending, sitting, standing, squatting, sleeping, stairs, transfers, bed mobility, hygiene/grooming, and locomotion level  PARTICIPATION LIMITATIONS:  all ADLs limited  PERSONAL FACTORS: Time since onset of injury/illness/exacerbation and 3+ comorbidities: T2DM, CKD on dialysis, CHF, L3/4 discectomy  are also affecting patient's functional outcome.   REHAB POTENTIAL: Good  CLINICAL DECISION MAKING: Evolving/moderate complexity  EVALUATION COMPLEXITY: Moderate   GOALS: Goals reviewed with patient? Yes  SHORT TERM GOALS: Target date: 01/04/2023   Patient will be independent with initial HEP.  Baseline:  Goal status: MET  2.  Patient will report decreased pain by 25%.  Baseline:  Goal status: IN PROGRESS - 12/28/22 20%  LONG TERM GOALS: Target date: 02/01/2023    Patient will be independent with advanced/ongoing HEP to improve outcomes and carryover.  Baseline:  Goal status: IN PROGRESS 01/11/23 I with current HEP  2.  Patient will report 50-75% improvement in low back and leg pain to improve QOL.  Baseline:  Goal status: IN PROGRESS 01/11/23  6% improvement in  pain; however he reports his endurance for walking has improved.  3.  Patient will demonstrate functional lumbar ROM to perform ADLs.   Baseline:  Goal status: IN PROGRESS  01/11/23 limited in extension and R rotation by pain  4.  Patient will demonstrate improved functional strength by increased 30 second chair stand test by 5. Baseline: 0 Goal status: MET 12 reps  5.  Patient will report 34 on modified oswestry to demonstrate improved functional ability.  Baseline:  40 / 50 = 80.0 % Goal status: MET 01/11/23  34 / 50 = 68.0 %  6.  Patient will report ability to sleep a minimum of 5 hours without waking from pain. Baseline:  Goal status: IN PROGRESS  01/11/23 Still limited to 3-4 hours     PLAN:  PT FREQUENCY: 2x/week  PT DURATION: 8 weeks  PLANNED INTERVENTIONS: Therapeutic exercises, Therapeutic activity, Neuromuscular re-education, Balance training, Gait training, Patient/Family education, Self Care, Joint mobilization, Aquatic Therapy, Dry Needling, Electrical stimulation, Spinal mobilization, Cryotherapy, Moist heat, scar mobilization, Taping, Ultrasound, Ionotophoresis 4mg /ml Dexamethasone, and Manual therapy.  PLAN FOR NEXT SESSION:  continue core and LE strengthening as tolerated. TENS education with interpreter.   Darleene Cleaver, PTA 01/20/2023, 2:46 PM

## 2023-01-21 ENCOUNTER — Telehealth: Payer: Self-pay | Admitting: Family Medicine

## 2023-01-21 NOTE — Telephone Encounter (Signed)
Darel Hong with [facility]auer at [place] called to advise she received a packet of information including an FL2 form for this patient and wife, Kenneth [name]. She had a couple of questions so she can assist. Please call her back at [phone]

## 2023-01-22 ENCOUNTER — Telehealth: Payer: Self-pay | Admitting: Gastroenterology

## 2023-01-22 NOTE — Telephone Encounter (Signed)
(  [place] speaking patient, need phone interpreter)  Dialysis patient, anemia, heme positive stool.  This pill camera study was done after April 2 EGD and colonoscopy.  The battery ran out before seeing the entire small intestine, but there was no active bleeding and no clear sources of blood loss.  As I discussed with him and his wife after his endoscopic procedures, although his stool tested positive for microscopic blood, there has been no visible source of blood loss on his testing.  I believe the anemia is related to his chronic kidney disease, and I suspect both his primary care provider and nephrologist will agree with that.  They wanted him to have this testing done so we can just be sure we were not missing a source of blood loss that needed specific therapy for it.  His dialysis doctor will be used to dealing with anemia and can check his blood counts regularly and give him iron or other medicines as needed.    - HD

## 2023-01-22 NOTE — Telephone Encounter (Signed)
Called and no one was at the facility today-01/22/23 that could look up the [facility], so will call back on Monday 01/25/2023.

## 2023-01-25 ENCOUNTER — Other Ambulatory Visit: Payer: Self-pay | Admitting: Pulmonary Disease

## 2023-01-25 ENCOUNTER — Other Ambulatory Visit (HOSPITAL_BASED_OUTPATIENT_CLINIC_OR_DEPARTMENT_OTHER): Payer: Self-pay

## 2023-01-25 ENCOUNTER — Encounter: Payer: 59 | Admitting: Physical Therapy

## 2023-01-25 DIAGNOSIS — K219 Gastro-esophageal reflux disease without esophagitis: Secondary | ICD-10-CM

## 2023-01-25 NOTE — Telephone Encounter (Signed)
Attempted to reach patient via interpreter services (Interpreter ID: [phone]). We tried to reach patient on work phone and it does not go through. Interpreter left a vm on mobile phone for patient to return call.

## 2023-01-26 NOTE — Telephone Encounter (Signed)
Called back and Darel Hong will call back as she was going in a meeting.

## 2023-01-26 NOTE — Telephone Encounter (Signed)
Notes faxed and further notes they will need to contact the patient Endo doctor.

## 2023-01-26 NOTE — Telephone Encounter (Signed)
[facility] called back and had received some information regarding this patient, but was not complete/or clear. The staff at [facility] thinks may have been from another provider. Message information completed. She stated she would just hold what she had on this patient for now. Note closed at this time

## 2023-01-26 NOTE — Telephone Encounter (Signed)
[facility] associate called stating the office notes they have received so far have been non-diabetic, and they need diabetic office notes from Feb 8th to May 8th of 2024. Please advise.

## 2023-01-27 ENCOUNTER — Ambulatory Visit: Payer: 59

## 2023-01-28 NOTE — Therapy (Signed)
OUTPATIENT PHYSICAL THERAPY TREATMENT       Patient Name: Kenneth [name] MRN: [id number] DOB:02-Dec-1955, 67 y.o., male Today's Date: 02/01/2023  END OF SESSION:  PT End of Session - 02/01/23 1357     Visit Number 14    Date for PT Re-Evaluation 02/01/23    Authorization Type [employer] MCR    PT Start Time 1345    PT Stop Time 1429    PT Time Calculation (min) 44 min    Activity Tolerance Patient tolerated treatment well    Behavior During Therapy WFL for tasks assessed/performed                  Past Medical History:  Diagnosis Date   Arthritis    hands   CAD (coronary artery disease) 10/30/2016   NSTEMI 9/17 with CABG x 3 (LIMA-LAD, SVG-OM, SVG-RCA).   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   type 1 DM   Dyspnea    with exertion   ESRD on hemodialysis (HCC)    "MWF; [place]" (03/30/2018)   Gastroesophageal reflux disease 08/18/2016   Hyperlipidemia    Hypertension    Ischemic rest pain of lower extremity 02/06/2015   Keratoma 02/27/2015   Metatarsal deformity 02/27/2015   PAF (paroxysmal atrial fibrillation) (HCC)    Pronation deformity of both feet 02/27/2015   Past Surgical History:  Procedure Laterality Date   A/V FISTULAGRAM Left 03/06/2021   Procedure: A/V FISTULAGRAM;  Surgeon: Cephus Shelling, MD;  [facility]: [facility];  Service: Cardiovascular;  Laterality: Left;   A/V FISTULAGRAM Left 06/02/2022   Procedure: A/V Fistulagram;  Surgeon: Nada Libman, MD;  Location: [facility];  Service: Cardiovascular;  Laterality: Left;   AV FISTULA PLACEMENT Left 06/22/2016   Procedure: ARTERIOVENOUS (AV) FISTULA CREATION LEFT UPPER ARM;  Surgeon: Larina Earthly, MD;  Location: [facility] OR;  Service: Vascular;  Laterality: Left;   BIOPSY  12/19/2020   Procedure: BIOPSY;  Surgeon: Meryl Dare, MD;  Location:  [facility] ENDOSCOPY;  Service: Endoscopy;;   BIOPSY  12/15/2022   Procedure: BIOPSY;  Surgeon: Sherrilyn Rist, MD;  Location: [facility]COPY;  Service: Gastroenterology;;   CARDIAC CATHETERIZATION N/A 05/31/2016   Procedure: Left Heart Cath and Coronary Angiography;  Surgeon: Corky Crafts, MD;  Location: [facility] INVASIVE CV LAB;  Service: Cardiovascular;  Laterality: N/A;   CARDIAC CATHETERIZATION N/A 05/31/2016   Procedure: Right Heart Cath;  Surgeon: Corky Crafts, MD;  Location: [facility] INVASIVE CV LAB;  Service: Cardiovascular;  Laterality: N/A;   CARDIAC CATHETERIZATION N/A 05/31/2016   Procedure: IABP Insertion;  Surgeon: Corky Crafts, MD;  [facility]: [facility];  Service: Cardiovascular;  Laterality: N/A;   COLONOSCOPY WITH PROPOFOL N/A 12/20/2020   Procedure: COLONOSCOPY WITH PROPOFOL;  Surgeon: Napoleon Form, MD;  Location: [facility]OS[facility];  Service: Endoscopy;  Laterality: N/A;   COLONOSCOPY WITH PROPOFOL N/A 12/15/2022   Procedure: COLONOSCOPY WITH PROPOFOL;  Surgeon: Sherrilyn Rist, MD;  Location: [facility] ENDOSCOPY;  Service: Gastroenterology;  Laterality: N/A;   CORONARY ARTERY BYPASS GRAFT N/A 06/05/2016   Procedure: CORONARY ARTERY BYPASS GRAFTING (CABG) x3 LIMA to LAD -SVG to OM -SVG to RCA;  Surgeon: Kerin Perna, MD;  Location: [facility] OR;  Service: Open Heart Surgery;  Laterality:  N/A;   CORONARY STENT INTERVENTION N/A 07/07/2017   Procedure: CORONARY STENT INTERVENTION;  Surgeon: Iran Ouch, MD;  [facility];  Service: Cardiovascular;  Laterality: N/A;   CORONARY STENT INTERVENTION N/A 03/30/2018   Procedure: CORONARY STENT INTERVENTION;  Surgeon: Swaziland, Peter M, MD;  Location: [facility] INVASIVE CV LAB;  Service: Cardiovascular;  Laterality: N/A;   ESOPHAGOGASTRODUODENOSCOPY (EGD) WITH PROPOFOL N/A 12/19/2020   Procedure: ESOPHAGOGASTRODUODENOSCOPY (EGD) WITH PROPOFOL;  Surgeon: Meryl Dare, MD;  Location: [facility] ENDOSCOPY;  Service: Endoscopy;  Laterality:  N/A;   ESOPHAGOGASTRODUODENOSCOPY (EGD) WITH PROPOFOL N/A 12/15/2022   Procedure: ESOPHAGOGASTRODUODENOSCOPY (EGD) WITH PROPOFOL;  Surgeon: Sherrilyn Rist, MD;  Location: [facility] ENDOSCOPY;  Service: Gastroenterology;  Laterality: N/A;   EXCHANGE OF A DIALYSIS CATHETER Right 04/16/2022   Procedure: EXCHANGE OF A DIALYSIS CATHETER;  Surgeon: Leonie Douglas, MD;  Location: [facility] OR;  Service: Vascular;  Laterality: Right;   GIVENS CAPSULE STUDY N/A 12/20/2020   Procedure: GIVENS CAPSULE STUDY;  Surgeon: Napoleon Form, MD;  Location: [facility]OS[facility];  Service: Endoscopy;  Laterality: N/A;   HEMOSTASIS CLIP PLACEMENT  12/15/2022   Procedure: HEMOSTASIS CLIP PLACEMENT;  Surgeon: Sherrilyn Rist, MD;  Location: [facility] ENDOSCOPY;  Service: Gastroenterology;;   INSERTION OF DIALYSIS CATHETER N/A 06/05/2016   Procedure: INSERTION OF DIALYSIS/trialysis CATHETER;  Surgeon: Kerin Perna, MD;  Location: [facility] OR;  Service: Vascular;  Laterality: N/A;   INSERTION OF DIALYSIS CATHETER Right 06/13/2016   Procedure: INSERTION OF DIALYSIS CATHETER RIGHT INTERNAL JUGULAR;  Surgeon: Fransisco Hertz, MD;  Location: [facility] OR;  Service: Vascular;  Laterality: Right;   INSERTION OF DIALYSIS CATHETER Right 09/07/2019   Procedure: Insertion Of Dialysis Catheter;  Surgeon: Larina Earthly, MD;  Location: [facility] OR;  Service: Vascular;  Laterality: Right;   INSERTION OF DIALYSIS CATHETER N/A 04/09/2022   Procedure: INSERTION OF TUNNELED DIALYSIS CATHETER;  Surgeon: Leonie Douglas, MD;  Location: [facility] OR;  Service: Vascular;  Laterality: N/A;   INTRAOPERATIVE TRANSESOPHAGEAL ECHOCARDIOGRAM N/A 06/05/2016   Procedure: INTRAOPERATIVE TRANSESOPHAGEAL ECHOCARDIOGRAM;  Surgeon: Kerin Perna, MD;  Location: [facility] OR;  Service: Open Heart Surgery;  Laterality: N/A;   LEFT HEART CATH AND CORS/GRAFTS ANGIOGRAPHY N/A 11/03/2016   Procedure: Left Heart Cath and Cors/Grafts Angiography;  Surgeon: Lennette Bihari, MD;  [facility]: [facility];  Service:  Cardiovascular;  Laterality: N/A;   LEFT HEART CATH AND CORS/GRAFTS ANGIOGRAPHY N/A 07/07/2017   Procedure: LEFT HEART CATH AND CORS/GRAFTS ANGIOGRAPHY;  Surgeon: Laurey Morale, MD;  Location: [facility] INVASIVE CV LAB;  Service: Cardiovascular;  Laterality: N/A;   LEFT HEART CATH AND CORS/GRAFTS ANGIOGRAPHY N/A 03/30/2018   Procedure: LEFT HEART CATH AND CORS/GRAFTS ANGIOGRAPHY;  Surgeon: Swaziland, Peter M, MD;  Location: [facility] INVASIVE CV LAB;  Service: Cardiovascular;  Laterality: N/A;   LEFT HEART CATH AND CORS/GRAFTS ANGIOGRAPHY N/A 04/17/2019   Procedure: LEFT HEART CATH AND CORS/GRAFTS ANGIOGRAPHY;  Surgeon: Laurey Morale, MD;  Location: [facility] INVASIVE CV LAB;  Service: Cardiovascular;  Laterality: N/A;   LUMBAR LAMINECTOMY/DECOMPRESSION MICRODISCECTOMY Bilateral 09/01/2022   Procedure: Laminectomy and Foraminotomy - bilateral - Lumbar three-Lumbar four;  Surgeon: Julio Sicks, MD;  Location: [facility] OR;  Service: Neurosurgery;  Laterality: Bilateral;   PERIPHERAL VASCULAR BALLOON ANGIOPLASTY Left 06/02/2022   Procedure: PERIPHERAL VASCULAR BALLOON ANGIOPLASTY;  Surgeon: Nada Libman, MD;  [facility];  Service: Cardiovascular;  Laterality: Left;   PERIPHERAL VASCULAR INTERVENTION Left 03/06/2021  Procedure: PERIPHERAL VASCULAR INTERVENTION;  Surgeon: Cephus Shelling, MD;  Location: [facility] INVASIVE CV LAB;  Service: Cardiovascular;  Laterality: Left;   POLYPECTOMY  12/20/2020   Procedure: POLYPECTOMY;  Surgeon: Napoleon Form, MD;  Location: [facility]OS[facility];  Service: Endoscopy;;   POLYPECTOMY  12/15/2022   Procedure: POLYPECTOMY;  Surgeon: Sherrilyn Rist, MD;  Location: [facility] ENDOSCOPY;  Service: Gastroenterology;;   REVISION OF ARTERIOVENOUS GORETEX GRAFT Left 07/27/2019   Procedure: PLICATION OF ANEURYSM OF ARTERIOVENOUS FISTULA  LEFT ARM;  Surgeon: Larina Earthly, MD;  Location: [facility] OR;  Service: Vascular;  Laterality: Left;   REVISON OF ARTERIOVENOUS FISTULA Left 09/07/2019    Procedure: REVISON OF LEFT UPPER ARM ARTERIOVENOUS FISTULA;  Surgeon: Larina Earthly, MD;  Location: [facility] OR;  Service: Vascular;  Laterality: Left;   REVISON OF ARTERIOVENOUS FISTULA Left 04/09/2022   Procedure: LEFT ARM FISTULA REVISION;  Surgeon: Leonie Douglas, MD;  Location: [facility] OR;  Service: Vascular;  Laterality: Left;  PERIPHERAL NERVE BLOCK   REVISON OF ARTERIOVENOUS FISTULA Left 04/16/2022   Procedure: REVISON OF ARTERIOVENOUS FISTULA;  Surgeon: Leonie Douglas, MD;  Location: [facility] OR;  Service: Vascular;  Laterality: Left;   RIGHT/LEFT HEART CATH AND CORONARY ANGIOGRAPHY N/A 01/03/2021   Procedure: RIGHT/LEFT HEART CATH AND CORONARY ANGIOGRAPHY;  Surgeon: Laurey Morale, MD;  Location: [facility] INVASIVE CV LAB;  Service: Cardiovascular;  Laterality: N/A;   Patient Active Problem List   Diagnosis Date Noted   Gastric intestinal metaplasia 12/15/2022   Benign neoplasm of ascending colon 12/15/2022   Benign neoplasm of ileocecal valve 12/15/2022   Porokeratosis 11/10/2022   Depression, recurrent (HCC) 10/06/2022   Lumbar stenosis with neurogenic claudication 09/01/2022   Chronic arthritis 08/11/2022   Tinea pedis 06/09/2022   Orthostatic hypotension 04/15/2022   Elevated troponin 04/15/2022   Complication associated with dialysis catheter 04/15/2022   Complication of AV dialysis fistula 04/15/2022   ESRD (end stage renal disease) (HCC) 04/09/2022   Pulmonary emphysema (HCC) 02/21/2022   Acute on chronic anemia 02/21/2022   Type 2 diabetes mellitus with hyperglycemia, with long-term current use of insulin (HCC) 10/28/2021   Type 2 diabetes mellitus with diabetic polyneuropathy, with long-term current use of insulin (HCC) 10/28/2021   Hypertension associated with diabetes (HCC) 09/16/2021   Chronic bilateral low back pain with bilateral sciatica 09/16/2021   Partial seizure disorder (HCC)    Acute respiratory failure with hypoxia (HCC) 07/28/2021   Prolonged QT interval 01/01/2021    Hemoptysis 01/01/2021   Positive fecal occult blood test    Symptomatic bradycardia 12/15/2020   Postural dizziness with presyncope 12/15/2020   Blood in stool 12/15/2020   Acute metabolic encephalopathy 08/29/2019   Altered mental status 08/27/2019   Unresponsive episode 08/27/2019   AF (paroxysmal atrial fibrillation) (HCC) 08/27/2019   Status post coronary artery stent placement    NSTEMI (non-ST elevated myocardial infarction) (HCC) 03/30/2018   Hypertensive heart disease 09/12/2017   Hyperlipidemia associated with type 2 diabetes mellitus (HCC) 09/12/2017   Hyperkalemia 07/20/2017   Chronic diastolic CHF (congestive heart failure) (HCC) 04/14/2017   Hypertensive emergency 04/14/2017   Anemia, chronic renal failure 04/11/2017   Fluid overload 04/11/2017   Diabetic foot ulcer (HCC) 04/11/2017   Cellulitis in diabetic foot (HCC) 01/18/2017   Chest pain 12/16/2016   Hypertensive urgency 12/16/2016   Pressure injury of skin 11/03/2016   Type 2 diabetes mellitus with chronic kidney disease on chronic dialysis, with long-term current use of insulin (HCC) 10/31/2016  ESRD (end stage renal disease) on dialysis (HCC) 10/30/2016   Atherosclerotic heart disease of native coronary artery with other forms of angina pectoris with elevated troponin     Gastroesophageal reflux disease 08/18/2016   HCAP (healthcare-associated pneumonia)    Non-ST elevation (NSTEMI) myocardial infarction [facility])    Pronation deformity of both feet 02/27/2015   Metatarsal deformity 02/27/2015   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   Ischemic rest pain of lower extremity (HCC) 02/06/2015    PCP: Sharlene Dory, DO   REFERRING PROVIDER: Sharlene Dory*   REFERRING DIAG: [phone] (ICD-10-CM) - Spinal stenosis, lumbar region with neurogenic claudication   Rationale for Evaluation and Treatment: Rehabilitation  THERAPY DIAG:  Radiculopathy, lumbar region  Cramp and spasm  Muscle  weakness (generalized)  ONSET DATE: 3 weeks  SUBJECTIVE:                                                                                                                                                                                           SUBJECTIVE STATEMENT: Patient had kidney assessment and may be eligible for transplant in 2-3 months.  No interpreter today. [place] Interpreter: Brantley Stage   PERTINENT HISTORY:  T2DM, CKD on dialysis, CHF, L3/4 discectomy  PAIN:  Are you having pain? Yes: NPRS scale: 3/10 Pain location: low back  Pain description: pain in back Aggravating factors: everything Relieving factors: nothing  PRECAUTIONS: None  WEIGHT BEARING RESTRICTIONS: No  FALLS:  Has patient fallen in last 6 months? No  LIVING ENVIRONMENT: Lives with: lives with their family Lives in: House/apartment Stairs: Yes: Internal: 13 steps; on left going up and External: 5 steps; on left going up Has following equipment at home: Single point cane, Walker - 2 wheeled, and Environmental consultant - 4 wheeled  OCCUPATION: retired  PLOF: Independent with household mobility with device  PATIENT GOALS: get rid of pain  NEXT MD VISIT: 2 weeks now  OBJECTIVE:   DIAGNOSTIC FINDINGS:  MRI 10/26/22 IMPRESSION: 1. Interval posterior decompression and discectomy at L3-4. There is progressive loss of disc height with probable intradiscal vacuum phenomenon and a broad-based central and right-sided disc protrusion which appears slightly increased from the preoperative study. There is progressive mass effect on the thecal sac which is incompletely decompressed posteriorly. Right foraminal narrowing appears slightly worse with probable right L3 nerve root encroachment. 2. No other significant changes are identified. Multilevel spondylosis superimposed on congenitally short pedicles with resulting chronic multifactorial spinal stenosis from L1-2 through L4-5. 3. Reactive endplate edema at  L3-4 without evidence of discitis/osteomyelitis.  PATIENT SURVEYS:  Modified Oswestry  40 / 50 = 80.0 %  SCREENING FOR RED FLAGS: Bowel or bladder incontinence: No Spinal tumors: No Cauda equina syndrome: No Compression fracture: No Abdominal aneurysm: No  COGNITION: Overall cognitive status: Within functional limits for tasks assessed     SENSATION: WFL  MUSCLE LENGTH: HS: tight bil L> R Piriformis: bil L> R Hip Flexors: NT, but likely tight due to posture in standing Heelcords: WFL   POSTURE: rounded shoulders, forward head, decreased lumbar lordosis, increased thoracic kyphosis, and posterior pelvic tilt  PALPATION: Palpation: TTP at Bil gluteals L >R, bil lumbar.  Spinal mobility NT due to patient's pain level.  LUMBAR ROM:   AROM eval 01/04/23 01/11/23 02/01/23  Flexion Hands to knees Hands to mid shins Hands to mid shins Hands to mid shins  Extension Unable to get to neutral - bends knees to extend backward Painful when approaches neutral spine Painful when approaches neutral spine To neutral - limited by pain  Right lateral flexion 80% limited [facility] WFL  Left lateral flexion 90% limited [facility] WFL  Right rotation 75% limited 50% limited pain  50% limited 25% limited  Left rotation 80% limited 25% limited  25% limited 25% limited   (Blank rows = not tested)  LOWER EXTREMITY ROM:   WFL for MMT, hip extension not tested but decreased based on patient's inability to stand in neutral.  LOWER EXTREMITY MMT:    MMT Right eval Left eval Right 01/04/23 Left 01/04/23 Right 01/11/23 Left 01/11/23  Hip flexion 4 4- 4 4+ 5 5  Hip extension 4-* 4-* 4+ 4+ 5 4+  Hip abduction 5 4+ 5 4+ 5 5  Hip adduction 4 4- 5 4 5  4+  Hip internal rotation        Hip external rotation        Knee flexion 5 4+ 5 4+    Knee extension 4+ 4 4+ 4+ 5 4+  Ankle dorsiflexion 5 5 5  4+ 5 5   (Blank rows = not tested)   FUNCTIONAL TESTS:  30 seconds chair stand test: 12/09/22: score is 0 -  able to do 3x w/o hands but used hands 1x; 01/07/23  12 reps no hands  GAIT: Distance walked: 40 Assistive device utilized: Walker - 4 wheeled Level of assistance: Complete Independence Comments: flexed trunk with walker too far ahead of him  TODAY'S TREATMENT:                                                                                                                              DATE: 01/20/23 Therapeutic Exercise: to improve flexibility, strength and mobility.  Verbal and tactile cues throughout for technique Nustep L5 x 6 min Lat pull 20# x 10 then with GTB x 10 standing and 10 sitting Seated horizontal ABD GTB x 20 Seated shoulder ER GTB x 20 Seated thoracic ext x 4 review Standing Row GTB x 20 Reviewed remainder of current HEP verbally. Patient I and has no questions. Goals  assessed.   01/18/23 Therapeutic Exercise: to improve flexibility, strength and mobility.  Verbal and tactile cues throughout for technique Nustep L5 x 6 min Seated trunk extension BTB x 10 cues to retract shoulder blades Wall sit with OH shoulder flexion x 10 Childs pose at sink for lat stretch x 10 Row machine 25# x 10 Standing reciprocal arm swing x 10 B Knee ext 15# x 10 S/L  hip ABD x 10, then front and back taps x 10 R side only Applied TENS for pt - one channel, two electrodes to lumbar spine (interpreter not present for education) x 10 min with MHP  01/13/23 Therapeutic Exercise: to improve flexibility, strength and mobility.  Verbal and tactile cues throughout for technique Nustep L5 x 6 min Review of TENS device with patient Standing thoracic extension at wall with shld flexion up wall x 10 Lumbar extension at row machine 25# x 10 Standing reciprocal arm swing x 10 B S/L  clamshells 20x RTB both S/L reverse clamshells 20x both   4/29/224 Therapeutic Exercise: to improve flexibility, strength and mobility.  Verbal and tactile cues throughout for technique Nustep L5 x 6 min Seated trunk  extension BTB x 10 cues to retract shoulder blades S/L  hip ADD with ball 20x both S/L  clamshells 15x RTB both S/L reverse clamshells 15x both S/L  ABD x 15 RTB both  Therapeutic Activities: MMT, ROM, mod oswestry, goals assessed  MHP to low back at end of session x 10 min  01/06/23 Therapeutic Exercise: to improve flexibility, strength and mobility.  Verbal and tactile cues throughout for technique Nustep L5 x 7 min Lat pull 10# x 10, 20# x 20 Rows 20# x30 (2 x 15) - cues to keep shoulders down and retract (25# too much today) Seated trunk extension BTB x 10 S/L  hip ADD with ball 10x both S/L  clamshells 15x RTB both S/L reverse clamshells 15x both S/L  ABD x 15 RTB both  Modalities: TENS trial to lumbar spine x 10 min   PATIENT EDUCATION:  Education details: HEP update  Person educated: Patient Education method: Explanation, Demonstration, Tactile cues, Verbal cues, and Handouts Education comprehension: verbalized understanding, returned demonstration, verbal cues required, and needs further education  HOME EXERCISE PROGRAM: Access Code: [email or web address] URL: [email or web address] Date: 12/30/2022 Prepared by: Verta Ellen  Exercises - Seated Hamstring Stretch with Chair  - 2 x daily - 7 x weekly - 1 sets - 3 reps - 20-30 sec hold - Seated Figure 4 Piriformis Stretch  - 2 x daily - 7 x weekly - 1 sets - 3 reps - 30-60 sec hold - Seated Trunk Rotation  - 1 x daily - 7 x weekly - 1 sets - 10 reps - 10 sec hold - Seated Thoracic Extension Arms Overhead  - 1 x daily - 7 x weekly - 2 sets - 10 reps - Seated Pelvic Tilt  - 1 x daily - 7 x weekly - 2 sets - 10 reps - Seated Diagonal Chops with Medicine Ball  - 1 x daily - 7 x weekly - 2 sets - 10 reps - Seated Shoulder Horizontal Abduction with Resistance - Palms Down  - 1 x daily - 7 x weekly - 2 sets - 10 reps - Seated Hip Flexor Stretch  - 2 x daily - 7 x weekly - 1 sets - 3 reps - 30-60 sec hold - Abdominal Press  into Sellers  - 1 x daily - 7 x  weekly - 2 sets - 10 reps - 5 sec hold - Clamshell  - 1 x daily - 7 x weekly - 1-3 sets - 10 reps - Sidelying Reverse Clamshell  - 1 x daily - 7 x weekly - 1-3 sets - 10 reps - Sidelying Hip Adduction Isometric with Ball  - 1 x daily - 7 x weekly - 1-3 sets - 10 reps - 5 sec hold hold  ASSESSMENT:  CLINICAL IMPRESSION: DAILY HUESTON has met or partially met all of his goals and is ready for discharge. He presents today with reports of improved pain to 3-4/10 at worst (in the morning) and 2/10 in the afternoon. He is now able to sleep 5 hours without waking from pain. He is still limited with lumbar extension. He can get to neutral with pain, but is primarily limited by tight hip flexors. Functionally he is able to walk 3 times per day. PT encouraged him to continue with his HEP which was reviewed and finalized today. He also uses his TENS unit 2x/day.  Mr. Stoneburner agrees to discharge.    OBJECTIVE IMPAIRMENTS: decreased activity tolerance, difficulty walking, decreased ROM, decreased strength, increased muscle spasms, impaired flexibility, postural dysfunction, and pain.   ACTIVITY LIMITATIONS: carrying, lifting, bending, sitting, standing, squatting, sleeping, stairs, transfers, bed mobility, hygiene/grooming, and locomotion level  PARTICIPATION LIMITATIONS:  all ADLs limited  PERSONAL FACTORS: Time since onset of injury/illness/exacerbation and 3+ comorbidities: T2DM, CKD on dialysis, CHF, L3/4 discectomy  are also affecting patient's functional outcome.   REHAB POTENTIAL: Good  CLINICAL DECISION MAKING: Evolving/moderate complexity  EVALUATION COMPLEXITY: Moderate   GOALS: Goals reviewed with patient? Yes  SHORT TERM GOALS: Target date: 01/04/2023   Patient will be independent with initial HEP.  Baseline:  Goal status: MET  2.  Patient will report decreased pain by 25%.  Baseline:  Goal status: IN PROGRESS - 12/28/22 20%  LONG TERM GOALS: Target date:  02/01/2023    Patient will be independent with advanced/ongoing HEP to improve outcomes and carryover.  Baseline:  Goal status: MET   2.  Patient will report 50-75% improvement in low back and leg pain to improve QOL.  Baseline: 12/10 Goal status: MET 01/11/23   Pain in the morning 3-4/10; during day 2/10  3.  Patient will demonstrate functional lumbar ROM to perform ADLs.   Baseline:  Goal status: PARTIALLY MET  02/01/23 limited with extension by pain.  4.  Patient will demonstrate improved functional strength by increased 30 second chair stand test by 5. Baseline: 0 Goal status: MET 12 reps  5.  Patient will report 34 on modified oswestry to demonstrate improved functional ability.  Baseline:  40 / 50 = 80.0 % Goal status: MET 01/11/23  34 / 50 = 68.0 %  6.  Patient will report ability to sleep a minimum of 5 hours without waking from pain. Baseline:  Goal status: MET  02/01/23 reports 5 hours     PLAN:  PT FREQUENCY: 2x/week  PT DURATION: 8 weeks  PLANNED INTERVENTIONS: Therapeutic exercises, Therapeutic activity, Neuromuscular re-education, Balance training, Gait training, Patient/Family education, Self Care, Joint mobilization, Aquatic Therapy, Dry Needling, Electrical stimulation, Spinal mobilization, Cryotherapy, Moist heat, scar mobilization, Taping, Ultrasound, Ionotophoresis 4mg /ml Dexamethasone, and Manual therapy.  PLAN FOR NEXT SESSION:  See d/c summary.   Zissy Hamlett, PT 02/01/2023, 2:44 PM

## 2023-01-29 ENCOUNTER — Other Ambulatory Visit: Payer: Self-pay | Admitting: Pulmonary Disease

## 2023-01-29 ENCOUNTER — Other Ambulatory Visit (HOSPITAL_BASED_OUTPATIENT_CLINIC_OR_DEPARTMENT_OTHER): Payer: Self-pay

## 2023-01-29 DIAGNOSIS — K219 Gastro-esophageal reflux disease without esophagitis: Secondary | ICD-10-CM

## 2023-01-29 NOTE — Telephone Encounter (Signed)
2nd attempt to reach patient via interpreter services (Interpreter ID: [phone]). Interpreter left vm for patient to return call for VCE results.

## 2023-02-01 ENCOUNTER — Other Ambulatory Visit (HOSPITAL_BASED_OUTPATIENT_CLINIC_OR_DEPARTMENT_OTHER): Payer: Self-pay

## 2023-02-01 ENCOUNTER — Encounter: Payer: Self-pay | Admitting: Physical Therapy

## 2023-02-01 ENCOUNTER — Other Ambulatory Visit: Payer: Self-pay | Admitting: Pulmonary Disease

## 2023-02-01 ENCOUNTER — Ambulatory Visit: Payer: 59 | Admitting: Physical Therapy

## 2023-02-01 DIAGNOSIS — R252 Cramp and spasm: Secondary | ICD-10-CM

## 2023-02-01 DIAGNOSIS — M5416 Radiculopathy, lumbar region: Secondary | ICD-10-CM | POA: Diagnosis not present

## 2023-02-01 DIAGNOSIS — K219 Gastro-esophageal reflux disease without esophagitis: Secondary | ICD-10-CM

## 2023-02-01 DIAGNOSIS — M6281 Muscle weakness (generalized): Secondary | ICD-10-CM

## 2023-02-01 NOTE — Telephone Encounter (Signed)
Called and spoke with patient via interpreter services (Interpreter ID: [phone]). We reviewed VCE results and recommendations as outlined below. Pt verbalized understanding and had no concerns at the end of the call.

## 2023-02-03 ENCOUNTER — Encounter: Payer: 59 | Attending: Internal Medicine | Admitting: Nutrition

## 2023-02-03 ENCOUNTER — Other Ambulatory Visit (HOSPITAL_BASED_OUTPATIENT_CLINIC_OR_DEPARTMENT_OTHER): Payer: Self-pay

## 2023-02-03 ENCOUNTER — Ambulatory Visit (INDEPENDENT_AMBULATORY_CARE_PROVIDER_SITE_OTHER): Payer: 59 | Admitting: Family Medicine

## 2023-02-03 ENCOUNTER — Ambulatory Visit: Payer: 59 | Admitting: Nutrition

## 2023-02-03 ENCOUNTER — Encounter: Payer: Self-pay | Admitting: Family Medicine

## 2023-02-03 VITALS — BP 138/60 | HR 63 | Temp 99.4°F | Ht 65.0 in | Wt 130.2 lb

## 2023-02-03 DIAGNOSIS — G4700 Insomnia, unspecified: Secondary | ICD-10-CM

## 2023-02-03 DIAGNOSIS — Z992 Dependence on renal dialysis: Secondary | ICD-10-CM | POA: Insufficient documentation

## 2023-02-03 DIAGNOSIS — N186 End stage renal disease: Secondary | ICD-10-CM | POA: Insufficient documentation

## 2023-02-03 DIAGNOSIS — L309 Dermatitis, unspecified: Secondary | ICD-10-CM | POA: Diagnosis not present

## 2023-02-03 DIAGNOSIS — R2689 Other abnormalities of gait and mobility: Secondary | ICD-10-CM

## 2023-02-03 DIAGNOSIS — E1122 Type 2 diabetes mellitus with diabetic chronic kidney disease: Secondary | ICD-10-CM | POA: Insufficient documentation

## 2023-02-03 DIAGNOSIS — Z794 Long term (current) use of insulin: Secondary | ICD-10-CM | POA: Insufficient documentation

## 2023-02-03 MED ORDER — PRAZOSIN HCL 1 MG PO CAPS
1.0000 mg | ORAL_CAPSULE | Freq: Every day | ORAL | 0 refills | Status: DC
  Filled 2023-02-03: qty 30, 30d supply, fill #0

## 2023-02-03 MED ORDER — CLOBETASOL PROPIONATE 0.05 % EX SOLN
1.0000 | Freq: Two times a day (BID) | CUTANEOUS | 0 refills | Status: DC
Start: 2023-02-03 — End: 2023-10-25
  Filled 2023-02-03: qty 50, 25d supply, fill #0

## 2023-02-03 MED ORDER — TRIAMCINOLONE ACETONIDE 0.1 % EX CREA
1.0000 | TOPICAL_CREAM | Freq: Two times a day (BID) | CUTANEOUS | 0 refills | Status: AC
Start: 2023-02-03 — End: ?
  Filled 2023-02-03: qty 30, 30d supply, fill #0

## 2023-02-03 NOTE — Patient Instructions (Addendum)
Stay active.  Someone will reach out regarding your MRI.   Try not to scratch as this can make things worse. Avoid scented products while dealing with this. You may resume when the itchiness resolves. Cold/cool compresses can help.   Sleep Hygiene Tips: Do not watch TV or look at screens within 1 hour of going to bed. If you do, make sure there is a blue light filter (nighttime mode) involved. Try to go to bed around the same time every night. Wake up at the same time within 1 hour of regular time. Ex: If you wake up at 7 AM for work, do not sleep past 8 AM on days that you don't work. Do not drink alcohol before bedtime. Do not consume caffeine-containing beverages after noon or within 9 hours of intended bedtime. Get regular exercise/physical activity in your life, but not within 2 hours of planned bedtime. Do not take naps.  Do not eat within 2 hours of planned bedtime. Melatonin, 3-5 mg 30-60 minutes before planned bedtime may be helpful.  The bed should be for sleep or sex only. If after 20-30 minutes you are unable to fall asleep, get up and do something relaxing. Do this until you feel ready to go to sleep again.   Sleep is important to [place] all. Getting good sleep is imperative to adequate functioning during the day. Work with our counselors who are trained to help people obtain quality sleep. Call [phone] to schedule an appointment or if you are curious about insurance coverage/cost.  Let us know if you need anything.

## 2023-02-03 NOTE — Progress Notes (Signed)
Chief Complaint  Patient presents with   Follow-up    Check legs Dizziness Sleeping problems     Kenneth [name] is 67 y.o. pt here for dizziness. Here w spouse and a video [place] interpreter.   Duration: 3 days Lasts 20-30 minutes or so. No obvious triggers such as moving his head or changing positions.  Pass out? No Spinning? Yes Recent illness/fever? No Headache? Yes Neurologic signs? No Change in PO intake? No Fluttering in chest? No  Sleep If he naps during the day, he cannot sleep during the night. Has been going on for 3 mo. Still has trouble sleeping even without napping. No alcohol use. He drinks coffee throughout the day. Does try to walk throughout the day. He does snore, but no reports of stopping breathing.   Rash Itchy rash on scalp that seems to be spreading ot legs. Going on for 3 weeks. No new lotions/soaps/topicals/detergents. No drainage. Pain from scratching.   Past Medical History:  Diagnosis Date   Arthritis    hands   CAD (coronary artery disease) 10/30/2016   NSTEMI 9/17 with CABG x 3 (LIMA-LAD, SVG-OM, SVG-RCA).   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   type 1 DM   Dyspnea    with exertion   ESRD on hemodialysis (HCC)    "MWF; [facility]" (03/30/2018)   Gastroesophageal reflux disease 08/18/2016   Hyperlipidemia    Hypertension    Ischemic rest pain of lower extremity 02/06/2015   Keratoma 02/27/2015   Metatarsal deformity 02/27/2015   PAF (paroxysmal atrial fibrillation) (HCC)    Pronation deformity of both feet 02/27/2015    Family History  Problem Relation Age of Onset   CAD Father    Colon cancer Father    Diabetes Brother    Sleep apnea Son     Allergies as of 02/03/2023   No Known Allergies      Medication List        Accurate as of Feb 03, 2023  2:57 PM. If you have any questions,  ask your nurse or doctor.          STOP taking these medications    sertraline 50 MG tablet Commonly known as: ZOLOFT Stopped by: Sharlene Dory, DO       TAKE these medications    ARANESP (ALBUMIN FREE) IJ Darbepoetin Alfa (Aranesp)   atorvastatin 80 MG tablet Commonly known as: LIPITOR Take 1 tablet (80 mg total) by mouth daily.   calcium acetate 667 MG capsule Commonly known as: PHOSLO Take 667 mg by mouth 3 (three) times daily with meals.   carvedilol 12.5 MG tablet Commonly known as: COREG Take 1 tablet (12.5 mg total) by mouth 2 (two) times daily with a meal. What changed: additional instructions   clobetasol 0.05 % external solution Commonly known as: TEMOVATE Apply 1 Application topically 2 (two) times daily. Started by: Sharlene Dory, DO   DULoxetine 30 MG capsule Commonly known as: Cymbalta Take 1 capsule (30 mg total) by mouth daily.   Eliquis 5 MG Tabs tablet Generic drug: apixaban Take 1 tablet (5 mg total) by mouth 2 (two) times daily.   ezetimibe 10 MG tablet Commonly known as: ZETIA Take 1 tablet by mouth daily What changed:  how much to take how to take this when  to take this   FreeStyle Libre 2 Sensor Misc 1 Device by Does not apply route every 14 (fourteen) days.   Guardian Sensor 3 Misc 1 Device by Does not apply route as directed.   Guardian Link 3 Transmitter Misc 1 Device by Does not apply route as directed.   heparin 1000 unit/mL Soln injection Heparin Sodium (Porcine) 1,000 Units/mL Catheter Lock Venous   hydrALAZINE 100 MG tablet Commonly known as: APRESOLINE Take 1 tablet (100 mg total) by mouth 3 (three) times daily. DO NOT TAKE AM AND NOON DOSES ON HD DAYS.   HYDROcodone-acetaminophen 10-325 MG tablet Commonly known as: NORCO Take 1 tablet by mouth every 4 to 6 hours as needed for pain.   HYDROcodone-acetaminophen 10-325 MG tablet Commonly known as: NORCO Take 1 tablet by mouth every 4 to 6 hours  as needed for pain   HYDROcodone-acetaminophen 10-325 MG tablet Commonly known as: NORCO Take 1 tablet by mouth every 4 to 6 hours as needed for pain   isosorbide mononitrate 60 MG 24 hr tablet Commonly known as: IMDUR Take 1&1/2 tablets (90 mg total) by mouth daily. Hold on days of dialysis on Mon, Wed, Fri. What changed: additional instructions   LOKELMA PO Take 1 packet by mouth 4 (four) times a week. Hold on dialysis days   Lokelma 10 g Pack packet Generic drug: sodium zirconium cyclosilicate Take 1 packet dissolved in water as directed as directed Take on nondialysis days 3 times a week. Dissolve in 2-3 ounces of water   Lyumjev 100 UNIT/ML Soln Generic drug: Insulin Lispro-aabc Max Daily 30 units per insulin pump   megestrol 625 MG/5ML suspension Commonly known as: Megace ES Take 5 mLs (625 mg total) by mouth daily. What changed:  when to take this reasons to take this   MIRCERA IJ Inject as directed.   OneTouch Verio test strip Generic drug: glucose blood 1 each by Other route in the morning, at noon, in the evening, and at bedtime. Use as instructed   pantoprazole 40 MG tablet Commonly known as: PROTONIX Take 1 tablet (40 mg total) by mouth 2 (two) times daily.   prazosin 1 MG capsule Commonly known as: MINIPRESS Take 1 capsule (1 mg total) by mouth at bedtime. Started by: Sharlene Dory, DO   Stiolto Respimat 2.5-2.5 MCG/ACT Aers Generic drug: Tiotropium Bromide-Olodaterol Inhale 2 puffs by mouth into the lungs daily.   TechLite Pen Needles 32G X 6 MM Misc Generic drug: Insulin Pen Needle Use as directed 3 times daily   traMADol 50 MG tablet Commonly known as: ULTRAM Take 50-100 mg by mouth every 6 (six) hours as needed for severe pain.   triamcinolone cream 0.1 % Commonly known as: KENALOG Apply 1 Application topically 2 (two) times daily. Started by: Jilda Roche Evanne Matsunaga, DO        BP 138/60 (BP Location: Right Arm, Patient  Position: Sitting, Cuff Size: Normal)   Pulse 63   Temp 99.4 F (37.4 C) (Oral)   Ht 5\' 5"  (1.651 m)   Wt 130 lb 4 oz (59.1 kg)   SpO2 97%   BMI 21.67 kg/m  General: Awake, alert, appears stated age Eyes: PERRLA, EOMi Ears: Patent, TM's neg b/l Heart: RRR, no murmurs, no carotid bruits Lungs: CTAB, no accessory muscle use MSK: 5/5 strength throughout, gait normal Neuro: No cerebellar signs, patellar reflex 1/4 b/l wo clonus, calcaneal reflex 0/4 b/l wo clonus, biceps reflex 1/4 b/l wo clonus; Dix-Hall-Pike negative b/l. Skin: No  visible lesions over the scalp.  Hyperpigmented patches and macules Along with excoriations over his anterior legs.  No fluctuance or drainage. Psych: Age appropriate judgment and insight, normal mood and affect  Dermatitis - Plan: clobetasol (TEMOVATE) 0.05 % external solution, triamcinolone cream (KENALOG) 0.1 %  Insomnia, unspecified type  Balance problem - Plan: MR Brain Wo Contrast  Clobetasol for scalp, Kenalog for his legs.  Avoid scented products and scratching. Chronic, uncontrolled.  Start prazosin 1 mg nightly.  Sleep hygiene information in addition to CBT information provided in his AVS.  Follow-up in 1 month recheck. Given his history, will check an MRI of the brain to rule out a cerebellar stroke.  If normal, will consider reordering PT. Pt and spouse thru the interpreter voiced understanding and agreement to the plan.  Jilda Roche [place], DO 02/03/23 2:57 PM

## 2023-02-03 NOTE — Progress Notes (Signed)
Patient is here with his wife to be started on the new sensors for his [employer] pump.  They have the old sensors with no transmitter.  I called LouAnn and she says [employer] is waiting for them talk with the patient to confirm address and these will be shipped with the new pump.  Office in [place] is not open yet.  Pt. Given number to call to get the new pump and sensors sent to her.  Currently wearing the [place] 2 sensors and has 1 other one remaining.

## 2023-02-05 ENCOUNTER — Other Ambulatory Visit (HOSPITAL_BASED_OUTPATIENT_CLINIC_OR_DEPARTMENT_OTHER): Payer: Self-pay

## 2023-02-08 ENCOUNTER — Other Ambulatory Visit: Payer: Self-pay | Admitting: Family Medicine

## 2023-02-08 ENCOUNTER — Other Ambulatory Visit: Payer: Self-pay | Admitting: Pulmonary Disease

## 2023-02-08 DIAGNOSIS — F339 Major depressive disorder, recurrent, unspecified: Secondary | ICD-10-CM

## 2023-02-08 DIAGNOSIS — I152 Hypertension secondary to endocrine disorders: Secondary | ICD-10-CM

## 2023-02-08 DIAGNOSIS — K219 Gastro-esophageal reflux disease without esophagitis: Secondary | ICD-10-CM

## 2023-02-09 ENCOUNTER — Other Ambulatory Visit (HOSPITAL_BASED_OUTPATIENT_CLINIC_OR_DEPARTMENT_OTHER): Payer: Self-pay

## 2023-02-09 ENCOUNTER — Ambulatory Visit: Payer: 59 | Admitting: Podiatry

## 2023-02-09 ENCOUNTER — Other Ambulatory Visit: Payer: Self-pay

## 2023-02-09 MED ORDER — DULOXETINE HCL 30 MG PO CPEP
30.0000 mg | ORAL_CAPSULE | Freq: Every day | ORAL | 3 refills | Status: DC
Start: 2023-02-09 — End: 2023-05-19
  Filled 2023-02-09: qty 30, 30d supply, fill #0
  Filled 2023-03-14: qty 30, 30d supply, fill #1
  Filled 2023-04-12: qty 30, 30d supply, fill #2

## 2023-02-09 MED ORDER — CARVEDILOL 12.5 MG PO TABS
12.5000 mg | ORAL_TABLET | Freq: Two times a day (BID) | ORAL | 3 refills | Status: DC
Start: 2023-02-09 — End: 2023-05-19
  Filled 2023-02-09: qty 60, 30d supply, fill #0
  Filled 2023-03-14: qty 60, 30d supply, fill #1
  Filled 2023-04-12: qty 60, 30d supply, fill #2

## 2023-02-10 ENCOUNTER — Other Ambulatory Visit (HOSPITAL_BASED_OUTPATIENT_CLINIC_OR_DEPARTMENT_OTHER): Payer: Self-pay

## 2023-02-10 ENCOUNTER — Ambulatory Visit: Payer: 59 | Admitting: Podiatry

## 2023-02-16 ENCOUNTER — Telehealth: Payer: Self-pay | Admitting: Family Medicine

## 2023-02-16 NOTE — Telephone Encounter (Signed)
Pt dropped off certification of disability papers to be completed by PCP. Pt requests to be called upon completion of paper so they can pickup. Placed in PCP'S box.

## 2023-02-16 NOTE — Telephone Encounter (Signed)
PCP signed  Called the patient left msg. Form completed.

## 2023-02-19 ENCOUNTER — Other Ambulatory Visit (HOSPITAL_BASED_OUTPATIENT_CLINIC_OR_DEPARTMENT_OTHER): Payer: Self-pay

## 2023-02-19 MED ORDER — PREDNISOLONE ACETATE 1 % OP SUSP
1.0000 [drp] | OPHTHALMIC | 1 refills | Status: DC
  Filled 2023-02-19: qty 5, 30d supply, fill #0

## 2023-02-19 MED ORDER — OFLOXACIN 0.3 % OP SOLN
1.0000 [drp] | Freq: Four times a day (QID) | OPHTHALMIC | 0 refills | Status: AC
  Filled 2023-02-19: qty 5, 25d supply, fill #0

## 2023-02-22 ENCOUNTER — Other Ambulatory Visit: Payer: Self-pay

## 2023-02-22 ENCOUNTER — Other Ambulatory Visit (HOSPITAL_BASED_OUTPATIENT_CLINIC_OR_DEPARTMENT_OTHER): Payer: Self-pay

## 2023-02-22 MED ORDER — HYDROCODONE-ACETAMINOPHEN 10-325 MG PO TABS
ORAL_TABLET | ORAL | 0 refills | Status: DC
  Filled 2023-02-22: qty 60, 10d supply, fill #0

## 2023-02-23 ENCOUNTER — Ambulatory Visit: Payer: 59 | Admitting: Neurology

## 2023-03-05 ENCOUNTER — Ambulatory Visit (HOSPITAL_COMMUNITY)
Admission: RE | Admit: 2023-03-05 | Discharge: 2023-03-05 | Disposition: A | Discharge status: Home / Self Care | Payer: 59 | Source: Ambulatory Visit | Attending: Family Medicine | Admitting: Family Medicine

## 2023-03-05 ENCOUNTER — Encounter (HOSPITAL_COMMUNITY): Payer: Self-pay

## 2023-03-05 VITALS — BP 130/68 | HR 60 | Wt 121.6 lb

## 2023-03-05 DIAGNOSIS — I480 Paroxysmal atrial fibrillation: Secondary | ICD-10-CM

## 2023-03-05 DIAGNOSIS — Z955 Presence of coronary angioplasty implant and graft: Secondary | ICD-10-CM | POA: Diagnosis not present

## 2023-03-05 DIAGNOSIS — I10 Essential (primary) hypertension: Secondary | ICD-10-CM

## 2023-03-05 DIAGNOSIS — E1122 Type 2 diabetes mellitus with diabetic chronic kidney disease: Secondary | ICD-10-CM | POA: Diagnosis not present

## 2023-03-05 DIAGNOSIS — E875 Hyperkalemia: Secondary | ICD-10-CM | POA: Insufficient documentation

## 2023-03-05 DIAGNOSIS — E1140 Type 2 diabetes mellitus with diabetic neuropathy, unspecified: Secondary | ICD-10-CM | POA: Diagnosis not present

## 2023-03-05 DIAGNOSIS — D631 Anemia in chronic kidney disease: Secondary | ICD-10-CM | POA: Diagnosis not present

## 2023-03-05 DIAGNOSIS — G8929 Other chronic pain: Secondary | ICD-10-CM | POA: Insufficient documentation

## 2023-03-05 DIAGNOSIS — Z79899 Other long term (current) drug therapy: Secondary | ICD-10-CM | POA: Diagnosis not present

## 2023-03-05 DIAGNOSIS — E785 Hyperlipidemia, unspecified: Secondary | ICD-10-CM

## 2023-03-05 DIAGNOSIS — Z87891 Personal history of nicotine dependence: Secondary | ICD-10-CM | POA: Diagnosis not present

## 2023-03-05 DIAGNOSIS — G40909 Epilepsy, unspecified, not intractable, without status epilepticus: Secondary | ICD-10-CM

## 2023-03-05 DIAGNOSIS — J439 Emphysema, unspecified: Secondary | ICD-10-CM | POA: Diagnosis not present

## 2023-03-05 DIAGNOSIS — I2510 Atherosclerotic heart disease of native coronary artery without angina pectoris: Secondary | ICD-10-CM

## 2023-03-05 DIAGNOSIS — I5032 Chronic diastolic (congestive) heart failure: Secondary | ICD-10-CM | POA: Diagnosis present

## 2023-03-05 DIAGNOSIS — M545 Low back pain, unspecified: Secondary | ICD-10-CM | POA: Diagnosis not present

## 2023-03-05 DIAGNOSIS — N186 End stage renal disease: Secondary | ICD-10-CM | POA: Diagnosis not present

## 2023-03-05 DIAGNOSIS — Z951 Presence of aortocoronary bypass graft: Secondary | ICD-10-CM | POA: Diagnosis not present

## 2023-03-05 DIAGNOSIS — Z01818 Encounter for other preprocedural examination: Secondary | ICD-10-CM

## 2023-03-05 DIAGNOSIS — I132 Hypertensive heart and chronic kidney disease with heart failure and with stage 5 chronic kidney disease, or end stage renal disease: Secondary | ICD-10-CM | POA: Insufficient documentation

## 2023-03-05 DIAGNOSIS — I252 Old myocardial infarction: Secondary | ICD-10-CM | POA: Insufficient documentation

## 2023-03-05 DIAGNOSIS — Z992 Dependence on renal dialysis: Secondary | ICD-10-CM

## 2023-03-05 DIAGNOSIS — E118 Type 2 diabetes mellitus with unspecified complications: Secondary | ICD-10-CM

## 2023-03-05 DIAGNOSIS — Z7901 Long term (current) use of anticoagulants: Secondary | ICD-10-CM | POA: Insufficient documentation

## 2023-03-05 NOTE — Patient Instructions (Addendum)
?? ???? ?????  ?? ???? ?????, ????? ???? ??? ????? ???? ??? ????. ???? ?? ????  ??: ?? ?? ??   ?? ??:  ?? ??? ?? ??? ?? ?? ??? ?? ?? ?????.   [facility] ??? ?? ?? ??? ????? [facility] ???? ???? 2024? 7? 26? ?? 10?? ????? ??? ???? ?? ????. ?? 11?? ??????.  ?? ??? ?? ?? ????: 1. ?? ?? ? ??? ??. ??? ??? ??? ??????. 2. ???? ?? ?????. 3. ??? ?? ??? ?????. ??(???)? ?? 2,000mg ?? ?????. 4. ?? ??? ? ????? ?????. 5. ?? ??? ?? ?? ??? 2?? ???? ?????.   ?? ??? ?????? ??? ??? ?? ?? ??? ????? ?????. ???? ??? ?? ??? ????? ???? ??? ????, ??? ??? ?? ??? ?? ?? ??????. ??? ?? ??? ??? ??? ? ??? ??? ???? ?? ?? ???? ? ?? ???(??)? ?? ?? ???(APP - ?? ??? ? ?? ???)? ?????.  ?? ?? ?? ? ??? ?? ??? ?? ??? ???? ???? ? ????.  ??? ??? ??  ?? ??? ??  ???? ???? ??  ??? ???, NP  ?????? ???? ???  ??? ???, NP  ?????? ??? ??  ?? ???, NP  ?? ??(Karle Plumber), PharmD   ?? ?? ??? ??? ?? ??? ????? ????.   ??? [facility] ??? ???? ?????? ?????  ?? ?? ?? ???? ?? ??? ??? mychart? ?? ???? ???? [phone]? ?? ???? ??????.  ??? ?? ?? 2? ?? ???? ???? ??? ???? ???? ?????:  ??? ??  ??  ?? ??  ?? ??**??? ????? ?? ??? ?? ?????.  ?? 4? ??? ????? ?? ??? ??? ? ????.

## 2023-03-05 NOTE — Progress Notes (Addendum)
PCP: Dr. Cathie Hoops [facility]) Cardiology: Dr. Shirlee Latch  67 y.o. 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  He was admitted in 9/17 with NSTEMI.  LHC showed 3VD and he had CABG x 3.  He had CKD stage IV when he was admitted and developed ESRD requiring HD while in the hospital.  He had transient atrial fibrillation post-CABG.  He developed cardiogenic shock requiring milrinone but was able to taper off.  Echo in 9/17 showed EF 35-40%.    Admitted 8/1 through 04/16/2017 with malignant hypertension due to medication noncompliance. Continues on HD.  HF M-W-F. Discharge weight 140 pounds.   Admitted 10/18 with chest pain and pulmonary edema. NSTEMI noted on admit. Echo showed preserved EF but + WMAs. Cath with 95% ostial SVG-OM1 stenosis with TIMI 2 flow in graft.  Patient had DES to SVG-OM1.   Admitted in 12/18 with atypical chest pain, suspect not cardiac.   NSTEMI in 7/19 with severe in-stent restenosis in SVG-OM1, had DES x 2 to native OM1.  Echo showed EF 50%.  Atrial fibrillation was noted this admission.   He has had a chronic pattern of atypical chest pain.  Cardiolite in 3/20 showed EF 54%, small fixed inferior defect with no ischemia.   He was admitted in 8/20 with severe episode of chest pain.  Hs-TnI was mildly elevated without trend.  Cath was done, showing no target for PCI and stable disease. Echo was done showing EF 50-55% with moderate LVH and normal RV.    He was admitted in 4/22 with CHF and chest pain, elevated HS-TnI to 1673.  Volume overloaded treated with daily HD in hospital.  He had RHC/LHC done after he had been dialyzed x 2, showing normal filling pressures and unchanged coronary anatomy.  Echo showed EF 55-60%, RV normal.   Most recent echo 11/22 EF 60-65%, GIIDD, RV normal.    Admitted 11/22 after he became unresponsive at HD w/ ? Seizure like activity. K >7.5 (Lokelma had  been previously stopped). Given Lokelma 10 g + calcium gluconate + insulin. Also w/ fluid overload and given IV Lasix. Nephrology consulted and started urgent HD. HF consulted and recommended dry weight be lowered with NYHA III symptoms. Losartan stopped. 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 Discharged home with [facility] PT/OT, weight 136.8 lbs.  Seen by pulmonology for COPD (emphysema on CT chest) and breathing had improved after starting Stiolto.  Follow up 5/23, stable symptoms with no medication changes.  Admitted 6/23 with PNA, requiring supplemental oxygen. Readmitted day after discharge with encephalopathy and volume overload, resolved with increased volume removal at dialysis.  Last seen in AHF clinic 6/23. NYHA II-early III symptoms. Tolerating HD.  Underwent lumbar decompression surgery 12/23.   Today he returns for HF follow up. He is starting kidney transplant process at  [facility]. Overall feeling fine. He is tolerating dialysis well. He has occasional dyspnea, before he is due for dialysis. Able to do ADLs without issue. Denies palpitations, abnormal bleeding, CP, dizziness, edema, or PND/Orthopnea. Appetite ok. No fever or chills. Weight at home stable. Taking all medications.   ECG (personally reviewed): NSR rBBB 58 bpm, anterior TWIs  Labs (12/18): LDL 64, HDL 31 Labs (7/19): LDL 36  Labs (2/20): hgb 11.3, LDL 59, HDL 28 Labs (2/22): LDL 52, HDL 37 Labs (4/22): hgb 9.9 Labs (7/22): hgb 10.9 Labs (10/22): HDL 46, LDL  52 Labs (11/22): K 4.7, hgb 8.4 Labs (6/23): hgb 8.3 Labs (3/24): hgb A1C 7.8 Labs (4/24): hgb 12.9  PMH: 1. Type II diabetes 2. HTN 3. ESRD 4. CAD: NSTEMI 9/17 with CABG x 3 (LIMA-LAD, SVG-OM, SVG-RCA).   - NSTEMI 10/18 s/p DES to ostial SVG-OM - NSTEMI 7/19: severe in-stent restenosis in SVG-ramus, chronic total occlusion of SVG-RCA.  DES x 2 to native ramus.  - Cardiolite (3/20): EF 54%, small fixed inferior defect with no ischemia.  - LHC  (8/20): 90% pLAD, subtotal occlusion mLAD, LIMA-LAD patent, occluded SVG-high OM1, 40-50% in-stent restenosis in native OM1, 80% ostial small AV LCx (not target for intervention), occluded RCA, occluded SVG-RCA (chronic with left=>right collaterals).  No target for intervention.  - LHC (4/22): 90% pLAD, subtotal occlusion mLAD, LIMA-LAD patent, occluded SVG-high OM1, 50% in-stent restenosis in native OM1, 85% ostial small AV LCx (not target for intervention), occluded RCA, occluded SVG-RCA (chronic with left=>right collaterals).  No target for intervention.  5. Chronic systolic CHF: Ischemic cardiomyopathy.   - Echo (9/17) with EF 35-40%, grade II diastolic dysfunction.  - Echo (1/18) with EF 60-65%, grade II diastolic dysfunction. - Echo (40/98) with EF 55%, Akinesis of the basal-midinferolateral, inferior, and inferoseptal myocardiam. Akinesis of the basal anteroseptal myocardium.  - Echo (7/19): EF 50%, moderate LVH, mildly decreased RV systolic function.  - Echo (8/20): EF 50-55%, moderate LVH, normal RV - Echo (4/22): EF 55-60%, normal RV - Echo (11/22): EF 60-65%, RV normal. 6. Atrial fibrillation: Paroxysmal.  Only noted post-op CABG.  7. ABIs normal in 12/17. 5/18 ABIs normal.  1/19 ABIs normal. 4/20 ABIs normal.  8. Carotid stenosis: Carotid dopplers (12/18) with < 50% right common carotid stenosis, 1-39% LICA stenosis.  - Carotid dopplers (1/20): < 50% BICA stenosis.  - Carotid dopplers (5/21): Mild bilateral disease.  9. Atrial fibrillation: Paroxysmal.  10. 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 Sciatica 12. Partial seizures 13. ABIs (12/22): normal 14. COPD: Prior smoker.  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   FH: No premature CAD.  Review of systems complete and found to be negative unless listed in HPI.    Current Outpatient Medications  Medication  Sig Dispense Refill   apixaban (ELIQUIS) 5 MG TABS tablet Take 1 tablet (5 mg total) by mouth 2 (two) times daily. 180 tablet 2   atorvastatin (LIPITOR) 80 MG tablet Take 1 tablet (80 mg total) by mouth daily. 90 tablet 3   calcium acetate (PHOSLO) 667 MG capsule Take 667 mg by mouth 3 (three) times daily with meals.      carvedilol (COREG) 12.5 MG tablet Take 1 tablet (12.5 mg total) by mouth 2 (two) times daily with a meal. 60 tablet 3   clobetasol (TEMOVATE) 0.05 % external solution Apply 1 Application topically 2 (two) times daily. 50 mL 0   Continuous Blood Gluc Sensor (FREESTYLE LIBRE 2 SENSOR) MISC 1 Device by Does not apply route every 14 (fourteen) days. 6 each 3   Continuous Blood Gluc Sensor (GUARDIAN SENSOR 3) MISC 1 Device by Does not apply route as directed. 9 each 3   Continuous Blood Gluc Transmit (GUARDIAN LINK 3 TRANSMITTER) MISC 1 Device by Does not apply route as directed. 1 each 3   Darbepoetin Alfa (ARANESP, ALBUMIN FREE, IJ) Darbepoetin Alfa (Aranesp)     DULoxetine (CYMBALTA) 30 MG capsule  Take 1 capsule (30 mg total) by mouth daily. 30 capsule 3   ezetimibe (ZETIA) 10 MG tablet Take 1 tablet by mouth daily (Patient taking differently: Take 10 mg by mouth daily.) 90 tablet 3   glucose blood (ONETOUCH VERIO) test strip 1 each by Other route in the morning, at noon, in the evening, and at bedtime. Use as instructed 400 each 2   heparin 1000 unit/mL SOLN injection Heparin Sodium (Porcine) 1,000 Units/mL Catheter Lock Venous     hydrALAZINE (APRESOLINE) 100 MG tablet Take 1 tablet (100 mg total) by mouth 3 (three) times daily. DO NOT TAKE AM AND NOON DOSES ON HD DAYS. 270 tablet 2   HYDROcodone-acetaminophen (NORCO) 10-325 MG tablet Take 1 tablet by mouth every 4 to 6 hours as needed for pain. 60 tablet 0   HYDROcodone-acetaminophen (NORCO) 10-325 MG tablet Take 1 tablet by mouth every 4 to 6 hours as needed for pain 60 tablet 0   HYDROcodone-acetaminophen (NORCO) 10-325 MG  tablet Take 1 tablet by mouth every 4 to 6 hours as needed for pain 60 tablet 0   HYDROcodone-acetaminophen (NORCO) 10-325 MG tablet Take 1 tablet by mouth every 4 to 6 hours as needed for pain 60 tablet 0   Insulin Lispro-aabc (LYUMJEV) 100 UNIT/ML SOLN Max Daily 30 units per insulin pump 30 mL 3   Insulin Pen Needle (PEN NEEDLES) 32G X 6 MM MISC Use as directed 3 times daily 300 each 1   isosorbide mononitrate (IMDUR) 60 MG 24 hr tablet Take 1&1/2 tablets (90 mg total) by mouth daily. Hold on days of dialysis on Mon, Wed, Fri. (Patient taking differently: Take 90 mg by mouth daily. Hold Tues Thurs and Sat) 90 tablet 5   megestrol (MEGACE ES) 625 MG/5ML suspension Take 5 mLs (625 mg total) by mouth daily. (Patient taking differently: Take 625 mg by mouth daily as needed (poor appetite).) 150 mL 1   Methoxy PEG-Epoetin Beta (MIRCERA IJ) Inject as directed.     ofloxacin (OCUFLOX) 0.3 % ophthalmic solution Place 1 drop into the left eye 4 (four) times daily for 7 days then STOP> 5 mL 0   pantoprazole (PROTONIX) 40 MG tablet Take 1 tablet (40 mg total) by mouth 2 (two) times daily. 180 tablet 1   prazosin (MINIPRESS) 1 MG capsule Take 1 capsule (1 mg total) by mouth at bedtime. 30 capsule 0   prednisoLONE acetate (PRED FORTE) 1 % ophthalmic suspension Place 1 drop into the left eye 4 times daily times for 1week,3 times daily times for 1 week, 2 times daily times for 1 week and 1 times daily x 1 week THEN STOP 15 mL 1   Sodium Zirconium Cyclosilicate (LOKELMA PO) Take 1 packet by mouth 4 (four) times a week. Hold on dialysis days     sodium zirconium cyclosilicate (LOKELMA) 10 g PACK packet Take 1 packet dissolved in water as directed as directed Take on nondialysis days 3 times a week. Dissolve in 2-3 ounces of water 13 packet 11   Tiotropium Bromide-Olodaterol (STIOLTO RESPIMAT) 2.5-2.5 MCG/ACT AERS Inhale 2 puffs by mouth into the lungs daily. 4 g 11   traMADol (ULTRAM) 50 MG tablet Take 50-100 mg by  mouth every 6 (six) hours as needed for severe pain.     triamcinolone cream (KENALOG) 0.1 % Apply 1 Application topically 2 (two) times daily. 30 g 0   No current facility-administered medications for this encounter.   BP 130/68   Pulse 60  Wt 55.2 kg (121 lb 9.6 oz)   SpO2 98%   BMI 20.24 kg/m   Wt Readings from Last 3 Encounters:  03/05/23 55.2 kg (121 lb 9.6 oz)  02/03/23 59.1 kg (130 lb 4 oz)  12/15/22 62.6 kg (138 lb)   Physical Exam: General:  NAD. No resp difficulty, walked into clinic HEENT: Normal Neck: Supple. No JVD. Carotids 2+ bilat; no bruits. No lymphadenopathy or thryomegaly appreciated. Cor: PMI nondisplaced. Regular rate & rhythm. No rubs, gallops or murmurs. Lungs: Clear Abdomen: Soft, nontender, nondistended. No hepatosplenomegaly. No bruits or masses. Good bowel sounds. Extremities: No cyanosis, clubbing, rash, edema; LUE AVF Neuro: Alert & oriented x 3, cranial nerves grossly intact. Moves all 4 extremities w/o difficulty. Affect pleasant.  Assessment/Plan: 1. CAD: s/p CABG.  NSTEMI 10/18, cath with 95% ostial SVG-high OM1 stenosis with TIMI 2 flow in graft, s/p DES to SVG-OM1 07/07/17. NSTEMI 7/19 with in-stent restenosis in the SVG-ramus, had DES x 2 to native high OM1.   He has had chronic atypical chest pain episodes, Cardiolite in 3/20 showed no ischemia.  He had repeat cath in 8/20 showing occluded SVG-ramus (new) but continued patency of the native high OM1 s/p stenting.  No target for intervention.  LHC again in 4/22, no change from 8/20.  No intervention. No recent chest pain.    - He will continue Eliquis, so not on ASA.  - Continue Imdur 90 mg daily.  - Continue atorvastatin 80 mg daily, good lipids in 10/22.  Repeat lipids next visit.  2. Chronic diastolic CHF: EF low in past but improved after CABG. Echo 4/22 with EF 55-60%. Echo 11/22 EF 60-65%. Improved NYHA II symptoms. Volume per HD. - Continue hydralazine 100 mg tid + Imdur 90 mg daily. -  Continue Coreg 12.5 mg bid 3. Atrial fibrillation: Paroxysmal, NSR today.  - Continue Coreg 12.5 mg bid - Continue Eliquis 5 mg bid.  4. HTN: BP stable at HD. He takes his BP active meds on non-HD days. - Continue hydralazine 100 mg tid. - Continue Coreg 12.5 mg bid.  - Now off losartan with hyperkalemia. - Do not take BP-active meds pre-HD, can take after HD as long as BP not low.  5. ESRD: MWF. Continue Lokelma. 6. Hyperlipidemia: Continue atorvastatin and Zetia. Lipids ok 10/22. Repeat lipids at next visit. 7. Type 2 diabetes: With diabetic neuropathy.  8. Low back pain: Chronic. S/p spinal decompression 12/23. 9. Seizure disorder: Continue Keppra. 10. COPD: Long-time smoker but has quit. Emphysema on 2/23 CT chest.  - Symptomatically improved on Stiolto.  11. Anemia: Anemia of renal disease.  Per nephrology. This likely contributes to his chronic dyspnea.  12. Pre-Op evaluation: He is being worked up for renal transplant at [facility]. Will update echo. Talked with [facility] transplant coordinator, Marcine Matar, she will reach out to her team to discuss what testing he needs (LHC vs stress test), as this testing is usually performed at transplant center. Last LHC 2022 with stable disease. No chest pain currently. He is at moderate risk for surgery from cardiac perspective. Revised Cardiac Risk Index score 5 pts (Class IV risk)  Follow up with Dr. Shirlee Latch next month, as scheduled.  [facility] FNP-BC 03/05/2023

## 2023-03-05 NOTE — Addendum Note (Signed)
Encounter addended by: Jacklynn Ganong, FNP on: 03/05/2023 4:48 PM  Actions taken: Clinical Note Signed

## 2023-03-08 ENCOUNTER — Encounter: Payer: Self-pay | Admitting: Family Medicine

## 2023-03-08 ENCOUNTER — Ambulatory Visit (INDEPENDENT_AMBULATORY_CARE_PROVIDER_SITE_OTHER): Payer: 59 | Admitting: Family Medicine

## 2023-03-08 ENCOUNTER — Other Ambulatory Visit (HOSPITAL_BASED_OUTPATIENT_CLINIC_OR_DEPARTMENT_OTHER): Payer: Self-pay

## 2023-03-08 VITALS — BP 128/84 | HR 69 | Temp 98.0°F | Ht 65.0 in | Wt 134.0 lb

## 2023-03-08 DIAGNOSIS — Z23 Encounter for immunization: Secondary | ICD-10-CM

## 2023-03-08 DIAGNOSIS — Z87891 Personal history of nicotine dependence: Secondary | ICD-10-CM

## 2023-03-08 DIAGNOSIS — G4700 Insomnia, unspecified: Secondary | ICD-10-CM

## 2023-03-08 MED ORDER — PRAZOSIN HCL 1 MG PO CAPS
1.0000 mg | ORAL_CAPSULE | Freq: Every day | ORAL | 3 refills | Status: DC
  Filled 2023-03-08: qty 90, 90d supply, fill #0

## 2023-03-08 NOTE — Progress Notes (Signed)
Chief Complaint  Patient presents with   Follow-up    Subjective: Patient is a 67 y.o. male here for f/u insomnia.  He was seen 1 month ago and started on prazosin 1 mg nightly.  He reports compliance and no adverse effects.  He reports near full resolution of his issue.  He would not like to adjust the dosage at all.  He is not following with a therapist/counselor at this time.  Past Medical History:  Diagnosis Date   Arthritis    hands   CAD (coronary artery disease) 10/30/2016   NSTEMI 9/17 with CABG x 3 (LIMA-LAD, SVG-OM, SVG-RCA).   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   type 1 DM   Dyspnea    with exertion   ESRD on hemodialysis (HCC)    "MWF; [facility]" (03/30/2018)   Gastroesophageal reflux disease 08/18/2016   Hyperlipidemia    Hypertension    Ischemic rest pain of lower extremity 02/06/2015   Keratoma 02/27/2015   Metatarsal deformity 02/27/2015   PAF (paroxysmal atrial fibrillation) (HCC)    Pronation deformity of both feet 02/27/2015    Objective: BP 128/84 (BP Location: Left Arm, Patient Position: Sitting, Cuff Size: Normal)   Pulse 69   Temp 98 F (36.7 C) (Oral)   Ht 5\' 5"  (1.651 m)   Wt 134 lb (60.8 kg)   SpO2 96%   BMI 22.30 kg/m  General: Awake, appears stated age Lungs: No accessory muscle use Psych: Age appropriate judgment and insight, normal affect and mood  Assessment and Plan: Insomnia, unspecified type  Stopped smoking with greater than 20 pack year history - Plan: Ambulatory Referral Lung Cancer Screening [facility]  Need for vaccination against Streptococcus pneumoniae - Plan: Pneumococcal conjugate vaccine 20-valent (Prevnar 20)  Chronic, stable.  Continue prazosin 1 mg nightly. Refer to the lung cancer screening team.  Hopefully they are able to reach out to them. PCV 20  today. Follow-up in 3 months for physical or as needed. The patient voiced understanding and agreement to the plan.  Jilda Roche Welch, DO 03/08/23  11:31 AM

## 2023-03-08 NOTE — Patient Instructions (Signed)
Take Claritin 5 mg daily for your scalp itchiness.

## 2023-03-15 ENCOUNTER — Other Ambulatory Visit (HOSPITAL_BASED_OUTPATIENT_CLINIC_OR_DEPARTMENT_OTHER): Payer: Self-pay

## 2023-03-24 ENCOUNTER — Other Ambulatory Visit (HOSPITAL_BASED_OUTPATIENT_CLINIC_OR_DEPARTMENT_OTHER): Payer: Self-pay

## 2023-03-24 MED ORDER — LIDOCAINE 5 % EX PTCH
MEDICATED_PATCH | CUTANEOUS | 1 refills | Status: DC
  Filled 2023-03-24: qty 30, 30d supply, fill #0

## 2023-03-24 MED ORDER — HYDROCODONE-ACETAMINOPHEN 10-325 MG PO TABS
ORAL_TABLET | ORAL | 0 refills | Status: DC
  Filled 2023-03-24: qty 60, 10d supply, fill #0

## 2023-03-24 MED ORDER — LIDOCAINE 5 % EX PTCH: MEDICATED_PATCH | CUTANEOUS | 1 refills | Status: DC

## 2023-03-31 ENCOUNTER — Telehealth: Payer: Self-pay | Admitting: Family Medicine

## 2023-03-31 NOTE — Telephone Encounter (Signed)
I see reason for order Balance?

## 2023-03-31 NOTE — Telephone Encounter (Signed)
Called Tonia Ghent back and informed of PCP response. She verbalized understanding/will let the patient/and wife know as they were not sure.

## 2023-03-31 NOTE — Telephone Encounter (Signed)
Kenneth [name] would like to know why an mri was ordered (on [place]). Please advise.

## 2023-04-04 ENCOUNTER — Ambulatory Visit (HOSPITAL_BASED_OUTPATIENT_CLINIC_OR_DEPARTMENT_OTHER)
Admission: RE | Admit: 2023-04-04 | Discharge: 2023-04-04 | Disposition: A | Discharge status: Home / Self Care | Payer: 59 | Source: Ambulatory Visit | Attending: Family Medicine | Admitting: Family Medicine

## 2023-04-04 DIAGNOSIS — R2689 Other abnormalities of gait and mobility: Secondary | ICD-10-CM | POA: Insufficient documentation

## 2023-04-09 ENCOUNTER — Ambulatory Visit (HOSPITAL_COMMUNITY)
Admission: RE | Admit: 2023-04-09 | Discharge: 2023-04-09 | Disposition: A | Discharge status: Home / Self Care | Payer: 59 | Source: Ambulatory Visit

## 2023-04-09 ENCOUNTER — Encounter (HOSPITAL_COMMUNITY): Payer: Self-pay | Admitting: Cardiology

## 2023-04-09 ENCOUNTER — Ambulatory Visit (HOSPITAL_COMMUNITY): Admission: RE | Admit: 2023-04-09 | Payer: 59 | Source: Ambulatory Visit | Admitting: Cardiology

## 2023-04-09 VITALS — BP 138/60 | HR 55 | Wt 123.8 lb

## 2023-04-09 DIAGNOSIS — Z7682 Awaiting organ transplant status: Secondary | ICD-10-CM | POA: Insufficient documentation

## 2023-04-09 DIAGNOSIS — I2510 Atherosclerotic heart disease of native coronary artery without angina pectoris: Secondary | ICD-10-CM | POA: Diagnosis not present

## 2023-04-09 DIAGNOSIS — I4510 Unspecified right bundle-branch block: Secondary | ICD-10-CM | POA: Insufficient documentation

## 2023-04-09 DIAGNOSIS — M545 Low back pain, unspecified: Secondary | ICD-10-CM | POA: Diagnosis not present

## 2023-04-09 DIAGNOSIS — Z992 Dependence on renal dialysis: Secondary | ICD-10-CM | POA: Insufficient documentation

## 2023-04-09 DIAGNOSIS — J449 Chronic obstructive pulmonary disease, unspecified: Secondary | ICD-10-CM | POA: Diagnosis not present

## 2023-04-09 DIAGNOSIS — D631 Anemia in chronic kidney disease: Secondary | ICD-10-CM | POA: Diagnosis not present

## 2023-04-09 DIAGNOSIS — I252 Old myocardial infarction: Secondary | ICD-10-CM | POA: Diagnosis not present

## 2023-04-09 DIAGNOSIS — E785 Hyperlipidemia, unspecified: Secondary | ICD-10-CM | POA: Diagnosis not present

## 2023-04-09 DIAGNOSIS — Z951 Presence of aortocoronary bypass graft: Secondary | ICD-10-CM | POA: Diagnosis not present

## 2023-04-09 DIAGNOSIS — I480 Paroxysmal atrial fibrillation: Secondary | ICD-10-CM | POA: Insufficient documentation

## 2023-04-09 DIAGNOSIS — I5032 Chronic diastolic (congestive) heart failure: Secondary | ICD-10-CM

## 2023-04-09 DIAGNOSIS — N186 End stage renal disease: Secondary | ICD-10-CM | POA: Diagnosis not present

## 2023-04-09 DIAGNOSIS — Z87891 Personal history of nicotine dependence: Secondary | ICD-10-CM | POA: Insufficient documentation

## 2023-04-09 DIAGNOSIS — E1122 Type 2 diabetes mellitus with diabetic chronic kidney disease: Secondary | ICD-10-CM | POA: Insufficient documentation

## 2023-04-09 DIAGNOSIS — I132 Hypertensive heart and chronic kidney disease with heart failure and with stage 5 chronic kidney disease, or end stage renal disease: Secondary | ICD-10-CM | POA: Diagnosis not present

## 2023-04-09 DIAGNOSIS — I5022 Chronic systolic (congestive) heart failure: Secondary | ICD-10-CM | POA: Diagnosis present

## 2023-04-09 LAB — LIPID PANEL
Cholesterol: 125 mg/dL (ref 0–200)
HDL: 36 mg/dL — ABNORMAL LOW (ref >40)
LDL Cholesterol: 61 mg/dL (ref 0–99)
Total CHOL/HDL Ratio: 3.5 RATIO
Triglycerides: 140 mg/dL (ref <150)
VLDL: 28 mg/dL (ref 0–40)

## 2023-04-09 LAB — ECHOCARDIOGRAM COMPLETE
AR max vel: 1.21 cm2
AV Area VTI: 1.23 cm2
AV Area mean vel: 1.29 cm2
AV Mean grad: 5.5 mmHg
AV Peak grad: 9.4 mmHg
Ao pk vel: 1.53 m/s
Area-P 1/2: 3.27 cm2
Calc EF: 57.5 %
Est EF: 55
S' Lateral: 3.7 cm
Single Plane A2C EF: 62.8 %
Single Plane A4C EF: 54.1 %

## 2023-04-09 NOTE — Patient Instructions (Addendum)
??? ???? ??? ????.  ?? ??? ?? ???   MyChart?? ???? ? ????. ????? ??? ?? ?? ????????.  ?? ??? 4?? ?? ?? ?? ??? ?? ?? ?????.  ?? ?? ?? ???? ?? ??? ??? mychart? ?? ???? ???? [id number]? ?? ???? ??????.    ??? ?? ?? 2? ?? ???? ???? ??? ???? ???? ?????:  ??? ??  ??  ?? ??  ?? ??**??? ????? ?? ??? ?? ?????.  ?? 4? ??? ????? ?? ??? ??? ? ????.   ?? ??? ?????? ??? ??? ?? ?? ??? ????? ?????. ???? ??? ?? ??? ????? ???? ??? ????, ??? ??? ?? ??? ?? ?? ??????. ??? ?? ??? ??? ??? ? ??? ??? ???? ?? ?? ???? ? ?? ???(??)? ?? ?? ???(APP - ?? ??? ? ?? ???)? ?????.   ?? ?? ?? ? ??? ?? ??? ?? ??? ???? ???? ? ????.  ??? ??? ??  ?? ??? ??  ???? ???? ??  ??? ???, NP  ?????? ???? ???  ??? ???, NP  ?????? ??? ??  ?? ???, NP  ?? ??(Karle Plumber), PharmD   ?? ?? ??? ??? ?? ??? ????? ????.    ??? [facility] ??? ???? ?????? ?????

## 2023-04-11 NOTE — Progress Notes (Signed)
PCP: Dr. Cathie Hoops [facility]) Cardiology: Dr. Shirlee Latch  67 y.o. 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  He was admitted in 9/17 with NSTEMI.  LHC showed 3VD and he had CABG x 3.  He had CKD stage IV when he was admitted and developed ESRD requiring HD while in the hospital.  He had transient atrial fibrillation post-CABG.  He developed cardiogenic shock requiring milrinone but was able to taper off.  Echo in 9/17 showed EF 35-40%.    Admitted 8/1 through 04/16/2017 with malignant hypertension due to medication noncompliance. Continues on HD.  HF M-W-F. Discharge weight 140 pounds.   Admitted 10/18 with chest pain and pulmonary edema. NSTEMI noted on admit. Echo showed preserved EF but + WMAs. Cath with 95% ostial SVG-OM1 stenosis with TIMI 2 flow in graft.  Patient had DES to SVG-OM1.   Admitted in 12/18 with atypical chest pain, suspect not cardiac.   NSTEMI in 7/19 with severe in-stent restenosis in SVG-OM1, had DES x 2 to native OM1.  Echo showed EF 50%.  Atrial fibrillation was noted this admission.   Cardiolite in 3/20 showed EF 54%, small fixed inferior defect with no ischemia.   He was admitted in 8/20 with severe episode of chest pain.  Hs-TnI was mildly elevated without trend.  Cath was done, showing no target for PCI and stable disease. Echo was done showing EF 50-55% with moderate LVH and normal RV.    He was admitted in 4/22 with CHF and chest pain, elevated HS-TnI to 1673.  Volume overloaded treated with daily HD in hospital.  He had RHC/LHC done after he had been dialyzed x 2, showing normal filling pressures and unchanged coronary anatomy.  Echo showed EF 55-60%, RV normal.   Echo 11/22 EF 60-65%, GIIDD, RV normal.    Admitted 11/22 after he became unresponsive at HD w/ ? Seizure like activity. K >7.5 (Lokelma had been previously stopped). Given Lokelma 10 g + calcium gluconate +  insulin. Also w/ fluid overload and given IV Lasix. Nephrology consulted and started urgent HD. HF consulted and recommended dry weight be lowered with NYHA III symptoms. Losartan stopped. 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 Discharged home with [facility] PT[facility], weight 136.8 lbs.  Seen by pulmonology for COPD (emphysema on CT chest) and breathing had improved after starting Stiolto.  Admitted 6/23 with PNA, requiring supplemental oxygen. Readmitted day after discharge with encephalopathy and volume overload, resolved with increased volume removal at dialysis.  Underwent lumbar decompression surgery 12/23.   Echo was done today and reviewed, EF 55% with moderate LVH, basal to mid inferior and inferoseptal hypokinesis, normal RV, IVC normal.   Today he returns for HF follow up. He has started the kidney transplant process at [facility]. He is stable symptomatically.  Main complaint continues to be back pain, uses a cane to help with this.  No exertional dyspnea or chest pain.  He is doing ok with dialysis, no issues with hypotension per his report. Weight stable.   ECG (personally reviewed): NSR, RBBB with anterolateral deep TWIs  Labs (12/18): LDL 64, HDL 31 Labs (7/19): LDL 36  Labs (2/20): hgb 11.3, LDL 59, HDL 28 Labs (2/22): LDL 52, HDL 37 Labs (4/22): hgb 9.9 Labs (7/22): hgb 10.9 Labs (10/22): HDL 46, LDL 52 Labs (11/22): K 4.7, hgb 8.4 Labs (6/23): hgb 8.3 Labs (3/24): hgb A1C 7.8  Labs (4/24): hgb 12.9  PMH: 1. Type II diabetes 2. HTN 3. ESRD 4. CAD: NSTEMI 9/17 with CABG x 3 (LIMA-LAD, SVG-OM, SVG-RCA).   - NSTEMI 10/18 s/p DES to ostial SVG-OM - NSTEMI 7/19: severe in-stent restenosis in SVG-ramus, chronic total occlusion of SVG-RCA.  DES x 2 to native ramus.  - Cardiolite (3/20): EF 54%, small fixed inferior defect with no ischemia.  - LHC (8/20): 90% pLAD, subtotal occlusion mLAD, LIMA-LAD patent, occluded SVG-high OM1, 40-50% in-stent restenosis in native OM1,  80% ostial small AV LCx (not target for intervention), occluded RCA, occluded SVG-RCA (chronic with left=>right collaterals).  No target for intervention.  - LHC (4/22): 90% pLAD, subtotal occlusion mLAD, LIMA-LAD patent, occluded SVG-high OM1, 50% in-stent restenosis in native OM1, 85% ostial small AV LCx (not target for intervention), occluded RCA, occluded SVG-RCA (chronic with left=>right collaterals).  No target for intervention.  5. Chronic systolic CHF: Ischemic cardiomyopathy.   - Echo (9/17) with EF 35-40%, grade II diastolic dysfunction.  - Echo (1/18) with EF 60-65%, grade II diastolic dysfunction. - Echo (95/28) with EF 55%, Akinesis of the basal-midinferolateral, inferior, and inferoseptal myocardiam. Akinesis of the basal anteroseptal myocardium.  - Echo (7/19): EF 50%, moderate LVH, mildly decreased RV systolic function.  - Echo (8/20): EF 50-55%, moderate LVH, normal RV - Echo (4/22): EF 55-60%, normal RV - Echo (11/22): EF 60-65%, RV normal. - Echo (7/24): EF 55% with moderate LVH, basal to mid inferior and inferoseptal hypokinesis, normal RV, IVC normal.  6. Atrial fibrillation: Paroxysmal.  Only noted post-op CABG.  7. ABIs normal in 12/17. 5/18 ABIs normal.  1/19 ABIs normal. 4/20 ABIs normal.  8. Carotid stenosis: Carotid dopplers (12/18) with < 50% right common carotid stenosis, 1-39% LICA stenosis.  - Carotid dopplers (1/20): < 50% BICA stenosis.  - Carotid dopplers (5/21): Mild bilateral disease.  9. Atrial fibrillation: Paroxysmal.  10. 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 Sciatica 12. Partial seizures 13. ABIs (12/22): normal 14. COPD: Prior smoker.  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 Suspected partial seizures  SH: Married, lives in [place], 1 son, prior smoker, no ETOH, speaks only [place].   FH: No premature CAD.  Review of systems complete and found to be negative unless listed in HPI.    Current Outpatient  Medications  Medication Sig Dispense Refill   apixaban (ELIQUIS) 5 MG TABS tablet Take 1 tablet (5 mg total) by mouth 2 (two) times daily. 180 tablet 2   atorvastatin (LIPITOR) 80 MG tablet Take 1 tablet (80 mg total) by mouth daily. 90 tablet 3   calcium acetate (PHOSLO) 667 MG capsule Take 667 mg by mouth 3 (three) times daily with meals.      carvedilol (COREG) 12.5 MG tablet Take 1 tablet (12.5 mg total) by mouth 2 (two) times daily with a meal. 60 tablet 3   clobetasol (TEMOVATE) 0.05 % external solution Apply 1 Application topically 2 (two) times daily. 50 mL 0   Continuous Blood Gluc Sensor (FREESTYLE LIBRE 2 SENSOR) MISC 1 Device by Does not apply route every 14 (fourteen) days. 6 each 3   Continuous Blood Gluc Sensor (GUARDIAN SENSOR 3) MISC 1 Device by Does not apply route as directed. 9 each 3   Continuous Blood Gluc Transmit (GUARDIAN LINK 3 TRANSMITTER) MISC 1 Device by Does not apply route as directed. 1 each 3   Darbepoetin Alfa (ARANESP, ALBUMIN FREE, IJ) Darbepoetin Alfa (Aranesp)  DULoxetine (CYMBALTA) 30 MG capsule Take 1 capsule (30 mg total) by mouth daily. 30 capsule 3   ezetimibe (ZETIA) 10 MG tablet Take 1 tablet by mouth daily 90 tablet 3   glucose blood (ONETOUCH VERIO) test strip 1 each by Other route in the morning, at noon, in the evening, and at bedtime. Use as instructed 400 each 2   hydrALAZINE (APRESOLINE) 100 MG tablet Take 1 tablet (100 mg total) by mouth 3 (three) times daily. DO NOT TAKE AM AND NOON DOSES ON HD DAYS. 270 tablet 2   HYDROcodone-acetaminophen (NORCO) 10-325 MG tablet Take 1 tablet by mouth every 4 to 6 hours as needed for pain. 60 tablet 0   HYDROcodone-acetaminophen (NORCO) 10-325 MG tablet Take 1 tablet by mouth every 4 to 6 hours as needed for pain 60 tablet 0   HYDROcodone-acetaminophen (NORCO) 10-325 MG tablet Take 1 tablet by mouth every 4 to 6 hours as needed for pain 60 tablet 0   HYDROcodone-acetaminophen (NORCO) 10-325 MG tablet Take  1 tablet by mouth every 4 - 6 hours as needed for pain 60 tablet 0   Insulin Lispro-aabc (LYUMJEV) 100 UNIT/ML SOLN Max Daily 30 units per insulin pump 30 mL 3   Insulin Pen Needle (PEN NEEDLES) 32G X 6 MM MISC Use as directed 3 times daily 300 each 1   isosorbide mononitrate (IMDUR) 60 MG 24 hr tablet Take 1&1/2 tablets (90 mg total) by mouth daily. Hold on days of dialysis on Mon, Wed, Fri. (Patient taking differently: Take 90 mg by mouth daily. Hold Tues Thurs and Sat) 90 tablet 5   lidocaine (LIDODERM) 5 % Apply 1 patch to skin as directed every day (May wear up to 12hours.) 30 patch 1   megestrol (MEGACE ES) 625 MG/5ML suspension Take 5 mLs (625 mg total) by mouth daily. 150 mL 1   Methoxy PEG-Epoetin Beta (MIRCERA IJ) Inject as directed.     pantoprazole (PROTONIX) 40 MG tablet Take 1 tablet (40 mg total) by mouth 2 (two) times daily. 180 tablet 1   prazosin (MINIPRESS) 1 MG capsule Take 1 capsule (1 mg total) by mouth at bedtime. 90 capsule 3   prednisoLONE acetate (PRED FORTE) 1 % ophthalmic suspension Place 1 drop into the left eye 4 (four) times daily.     sodium zirconium cyclosilicate (LOKELMA) 10 g PACK packet Take 1 packet dissolved in water as directed as directed Take on nondialysis days 3 times a week. Dissolve in 2-3 ounces of water 13 packet 11   Tiotropium Bromide-Olodaterol (STIOLTO RESPIMAT) 2.5-2.5 MCG/ACT AERS Inhale 2 puffs by mouth into the lungs daily. 4 g 11   triamcinolone cream (KENALOG) 0.1 % Apply 1 Application topically 2 (two) times daily. 30 g 0   No current facility-administered medications for this encounter.   BP 138/60   Pulse (!) 55   Wt 56.2 kg (123 lb 12.8 oz)   SpO2 98%   BMI 20.60 kg/m   Wt Readings from Last 3 Encounters:  04/09/23 56.2 kg (123 lb 12.8 oz)  03/08/23 60.8 kg (134 lb)  03/05/23 55.2 kg (121 lb 9.6 oz)   Physical Exam: General: NAD Neck: No JVD, no thyromegaly or thyroid nodule.  Lungs: Clear to auscultation bilaterally with  normal respiratory effort. CV: Nondisplaced PMI.  Heart regular S1/S2, no S3/S4, no murmur.  No peripheral edema.  No carotid bruit.  Normal pedal pulses.  Abdomen: Soft, nontender, no hepatosplenomegaly, no distention.  Skin: Intact without lesions or  rashes.  Neurologic: Alert and oriented x 3.  Psych: Normal affect. Extremities: No clubbing or cyanosis.  HEENT: Normal.   Assessment/Plan: 1. CAD: s/p CABG.  NSTEMI 10/18, cath with 95% ostial SVG-high OM1 stenosis with TIMI 2 flow in graft, s/p DES to SVG-OM1 07/07/17. NSTEMI 7/19 with in-stent restenosis in the SVG-ramus, had DES x 2 to native high OM1.   He has had chronic atypical chest pain episodes, Cardiolite in 3/20 showed no ischemia.  He had repeat cath in 8/20 showing occluded SVG-ramus (new) but continued patency of the native high OM1 s/p stenting.  No target for intervention.  LHC again in 4/22, no change from 8/20.  No intervention. No recent chest pain.    - He will continue Eliquis, so not on ASA.  - Continue Imdur 90 mg daily.  - Continue atorvastatin 80 mg daily + Zetia, check lipids today.  - No current indication for functional study/cath based on symptoms, but if transplant center feels that he needs evaluation prior to kidney transplant, I can arrange for either cath or cardiac PET.  2. Chronic diastolic CHF: EF low in past but improved after CABG. Echo today showed EF 55% with moderate LVH, basal to mid inferior and inferoseptal hypokinesis, normal RV, IVC normal.  Wall motion abnormalities on today's echo have been seen in the past.  NYHA I-II symptoms. Volume status ok.  - Volume managed via HD.  - Continue hydralazine 100 mg tid + Imdur 90 mg daily. - Continue Coreg 12.5 mg bid 3. Atrial fibrillation: Paroxysmal, NSR today.  - Continue Coreg 12.5 mg bid - Continue Eliquis 5 mg bid.  4. HTN: BP stable at HD. He takes his BP active meds on non-HD days. - Continue hydralazine 100 mg tid. - Continue Coreg 12.5 mg bid.   - Now off losartan with hyperkalemia. - Do not take BP-active meds pre-HD, can take after HD as long as BP not low.  5. ESRD: MWF. Continue Lokelma per nephrology.  6. Hyperlipidemia: Continue atorvastatin and Zetia. Check lipids today.  7. Type 2 diabetes: With diabetic neuropathy.  8. Low back pain: Chronic. S/p spinal decompression 12/23. 9. COPD: Long-time smoker but has quit. Emphysema on 2/23 CT chest.  - Symptomatically improved on Stiolto.  10. Anemia: Anemia of renal disease.  Per nephrology. This likely contributes to his chronic dyspnea.   Follow up 4 months with APP.   Marca Ancona  04/11/2023

## 2023-04-12 ENCOUNTER — Other Ambulatory Visit: Payer: Self-pay | Admitting: Family Medicine

## 2023-04-12 ENCOUNTER — Other Ambulatory Visit (HOSPITAL_BASED_OUTPATIENT_CLINIC_OR_DEPARTMENT_OTHER): Payer: Self-pay

## 2023-04-12 ENCOUNTER — Other Ambulatory Visit: Payer: Self-pay | Admitting: Cardiology

## 2023-04-12 DIAGNOSIS — R630 Anorexia: Secondary | ICD-10-CM

## 2023-04-12 MED ORDER — ISOSORBIDE MONONITRATE ER 60 MG PO TB24
90.0000 mg | ORAL_TABLET | Freq: Every day | ORAL | 5 refills | Status: DC
  Filled 2023-04-12: qty 78, 91d supply, fill #0
  Filled 2023-07-28: qty 78, 91d supply, fill #1
  Filled 2023-10-25: qty 78, 91d supply, fill #2
  Filled 2024-01-31: qty 78, 91d supply, fill #3

## 2023-04-12 MED ORDER — MEGESTROL ACETATE 625 MG/5ML PO SUSP
625.0000 mg | Freq: Every day | ORAL | 1 refills | Status: DC
Start: 2023-04-12 — End: 2023-10-25
  Filled 2023-04-12: qty 150, 30d supply, fill #0

## 2023-04-14 ENCOUNTER — Other Ambulatory Visit (HOSPITAL_BASED_OUTPATIENT_CLINIC_OR_DEPARTMENT_OTHER): Payer: Self-pay

## 2023-04-23 ENCOUNTER — Other Ambulatory Visit (HOSPITAL_BASED_OUTPATIENT_CLINIC_OR_DEPARTMENT_OTHER): Payer: Self-pay

## 2023-04-29 ENCOUNTER — Encounter (INDEPENDENT_AMBULATORY_CARE_PROVIDER_SITE_OTHER): Payer: Self-pay

## 2023-05-05 ENCOUNTER — Encounter: Payer: Self-pay | Admitting: Neurology

## 2023-05-05 ENCOUNTER — Ambulatory Visit (INDEPENDENT_AMBULATORY_CARE_PROVIDER_SITE_OTHER): Payer: 59 | Admitting: Neurology

## 2023-05-05 VITALS — BP 120/55 | HR 62 | Ht 65.0 in | Wt 126.0 lb

## 2023-05-05 DIAGNOSIS — R404 Transient alteration of awareness: Secondary | ICD-10-CM | POA: Diagnosis not present

## 2023-05-05 NOTE — Progress Notes (Signed)
NEUROLOGY FOLLOW UP OFFICE NOTE  Kenneth [name] [id number] 08-02-1956  HISTORY OF PRESENT ILLNESS: I had the pleasure of seeing Kenneth [name] in follow-up in the neurology clinic on 05/05/2023.  The patient was last seen almost a year ago for episodes of altered mental status. He is accompanied by his wife who helps supplement the history today. A [place] medical interpreter Kenneth [name] [phone]) helps with translation by phone. Records and images were personally reviewed where available.  He has a history of recurrent episodes of altered mental status and an episode of unresponsiveness in 2022 with gurgling respirations and reported seizure-like activity in the setting of several metabolic derangements. Repeat brain MRI studies and routine EEGs, as well as prolonged 47-hour EEG in 2023 were normal. He was adamant about stopping Levetiracetam on last visit, weaning instructions and risks off medication were discussed. He has been off medication and reports doing well with no episodes of unresponsiveness. His wife was previously reporting staring spells, she has not noticed any. There was one time while driving that she was concerned about, but he states he had received injections on both eyes and could not see clearly that day. He denies any headaches. He has dizziness every now and then, he sits down and feels better. No falls. He has not been sleeping well. He reports that since dialysis started, his days and nights have been reversed. He sleeps in the daytime.     History on Initial Assessment 03/26/2022: This is a 67 year old right-handed man with a history of hypertension, hyperlipidemia, DM, CAD s/p CABG, ESRD on HD, paroxysmal atrial fibrillation, toxoplama uveitis, presenting for evaluation of seizures. He has been evaluated by inpatient Neurology several times since 2019. He was admitted to [facility] for a cardiac catheterization and stents were placed in 03/2018. While doing physical therapy, he was noted to have  left sided-weakness and intermittent decreased level of consciousness. Exam noted a lot of inattention. MRI brain did not show any acute changes, there was mild chronic microvascular disease, small chronic left frontal cortex infarct. 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 He was found to have atrial fibrillation and started on Eliquis. In 08/2019, he was brought to the ER after he had chest pain then suddenly became unresponsive. On initial exam, he was initially unresponsive and smelling salts were put under his nose. He began to cough and sit up 30 seconds later, eyes were opened but he would not answer. Passive arm elevation and drop noted it would fall away from his face. He clenched his eyes tight with attempts to open. MRI brain no acute changes, EEG normal. There was concern for embellishment/factitious disorder, but when he was back to baseline the next day and reported feeling dizzy after taking Baclofen, it was favored that he had Baclofen toxicity in the setting of ESRD. He was evaluated in 07/2021 for lethargy and severe hyperkalemia (K > 7.5) with EKG changes and report of seizure witnessed by ER staff when transferring from stretcher to hospital bed ("patient became unresponsive, gurgling respirations, appeared to have seizure-like activity). He reported a 65-month history of daily posterior headaches and vision changes, his wife reported that for the past 6 months, he would stare off into the distance with jaw opening and closing with subsequent unresponsive state for less than a minute followed by confusion. His EEG was limited by movement artifact but within normal limits. He was started on Levetiracetam but it  appears he did not continue this on hospital discharge. He was admitted twice to [facility] in June for shortness of breath. During the second admission on 6/13, he was very lethargic and wife reported he was altered. Since he was not taking  the LEV, it was started at 500mg  qhs with additional 250mg  after HD.   His wife reports the staring episodes have occurred since he got sick for several years now. She is not sure if they are seizures, she would get his attention and starts answering a little later. She states he may just be daydreaming or tired. She notices it on a daily basis and just leaves him alone. There is no associated tongue bite, incontinence, or jerking/convulsive activity.  He keeps his eyes closed when not stimulated, but opens eyes and answers appropriately otherwise. He says he feels tired. He denies any headaches. He fell this morning and reported dizziness en route to the clinic, his wife notes his sugar dropped to 61. He states he only feels dizzy when hypoglycemic. He denies any olfactory/gustatory hallucinations, rising epigastric sensation, focal numbness/tingling/weakness, myoclonic jerks. He denies any diplopia, dysarthria/dysphagia, neck/back pain. He has numbness and tingling in both feet from neuropathy. He has a rollator usually when he is outside and has a cane today, but does not use any assistive devices at home. His wife reports he has HD three times a week so he sleeps during the day and is awake at night. He had a normal birth and early development.  There is no history of febrile convulsions, CNS infections such as meningitis/encephalitis, significant traumatic brain injury, neurosurgical procedures, or family history of seizures.   PAST MEDICAL HISTORY: Past Medical History:  Diagnosis Date   Arthritis    hands   CAD (coronary artery disease) 10/30/2016   NSTEMI 9/17 with CABG x 3 (LIMA-LAD, SVG-OM, SVG-RCA).   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 180 tablet 2   atorvastatin (LIPITOR) 80 MG tablet Take 1 tablet (80 mg total) by mouth daily. 90 tablet 3   calcium acetate (PHOSLO) 667 MG capsule Take 667 mg by mouth 3 (three) times daily with meals.      carvedilol (COREG) 12.5 MG tablet Take 1 tablet (12.5 mg total) by mouth 2 (two) times daily with a meal. 60 tablet 3   clobetasol (TEMOVATE) 0.05 % external solution Apply 1 Application topically 2 (two) times daily. 50 mL 0   Continuous Blood Gluc Sensor (FREESTYLE LIBRE 2 SENSOR) MISC 1 Device by Does not apply route every 14 (fourteen) days. 6 each 3   Continuous Blood Gluc Sensor (GUARDIAN SENSOR 3) MISC 1 Device by Does not apply route as directed.  9 each 3   Continuous Blood Gluc Transmit (GUARDIAN LINK 3 TRANSMITTER) MISC 1 Device by Does not apply route as directed. 1 each 3   DULoxetine (CYMBALTA) 30 MG capsule Take 1 capsule (30 mg total) by mouth daily. 30 capsule 3   ezetimibe (ZETIA) 10 MG tablet Take 1 tablet by mouth daily 90 tablet 3   glucose blood (ONETOUCH VERIO) test strip 1 each by Other route in the morning, at noon, in the evening, and at bedtime. Use as instructed 400 each 2   hydrALAZINE (APRESOLINE) 100 MG tablet Take 1 tablet (100 mg total) by mouth 3 (three) times daily. DO NOT TAKE AM AND NOON DOSES ON HD DAYS. 270 tablet 2   HYDROcodone-acetaminophen (NORCO) 10-325 MG tablet Take 1 tablet by mouth every 4 to 6 hours as needed for pain. 60 tablet 0   HYDROcodone-acetaminophen (NORCO) 10-325  MG tablet Take 1 tablet by mouth every 4 to 6 hours as needed for pain 60 tablet 0   HYDROcodone-acetaminophen (NORCO) 10-325 MG tablet Take 1 tablet by mouth every 4 to 6 hours as needed for pain 60 tablet 0   HYDROcodone-acetaminophen (NORCO) 10-325 MG tablet Take 1 tablet by mouth every 4 - 6 hours as needed for pain 60 tablet 0   Insulin Lispro-aabc (LYUMJEV) 100 UNIT/ML SOLN Max Daily 30 units per insulin pump 30 mL 3   Insulin Pen Needle (PEN NEEDLES) 32G X 6 MM MISC Use as directed 3 times daily 300 each 1   isosorbide mononitrate (IMDUR) 60 MG 24 hr tablet Take 1&1/2 tablets (90 mg total) by mouth daily. Hold on days of dialysis on Mon, Wed, Fri. 90 tablet 5   lidocaine (LIDODERM) 5 % Apply 1 patch to skin as directed every day (May wear up to 12hours.) 30 patch 1   megestrol (MEGACE ES) 625 MG/5ML suspension Take 5 mLs (625 mg total) by mouth daily. 150 mL 1   Methoxy PEG-Epoetin Beta (MIRCERA IJ) Inject as directed.     pantoprazole (PROTONIX) 40 MG tablet Take 1 tablet (40 mg total) by mouth 2 (two) times daily. 180 tablet 1   prazosin (MINIPRESS) 1 MG capsule Take 1 capsule (1 mg total) by mouth at bedtime. 90 capsule 3   prednisoLONE acetate (PRED FORTE) 1 % ophthalmic suspension Place 1 drop into the left eye 4 (four) times daily.     sodium zirconium cyclosilicate (LOKELMA) 10 g PACK packet Take 1 packet dissolved in water as directed as directed Take on nondialysis days 3 times a week. Dissolve in 2-3 ounces of water 13 packet 11   Tiotropium Bromide-Olodaterol (STIOLTO RESPIMAT) 2.5-2.5 MCG/ACT AERS Inhale 2 puffs by mouth into the lungs daily. 4 g 11   triamcinolone cream (KENALOG) 0.1 % Apply 1 Application topically 2 (two) times daily. 30 g 0   No current facility-administered medications on file prior to visit.    ALLERGIES: No Known Allergies  FAMILY HISTORY: Family History  Problem Relation Age of Onset   CAD Father    Colon cancer Father    Diabetes Brother     Sleep apnea Son     SOCIAL HISTORY: Social History   Socioeconomic History   Marital status: Married    Spouse name: Not on file   Number of children: 1   Years of education: Not on file   Highest education level: Not on file  Occupational History   Occupation: retired  Tobacco Use   Smoking status: Former  Current packs/day: 0.00    Average packs/day: 1.5 packs/day for 35.0 years (52.5 ttl pk-yrs)    Types: Cigarettes    Start date: 05/27/1978    Quit date: 05/27/2013    Years since quitting: 9.9    Passive exposure: Never   Smokeless tobacco: Never   Tobacco comments:    "quit e-cigs ~ 2014"  Vaping Use   Vaping status: Never Used  Substance and Sexual Activity   Alcohol use: Never    Alcohol/week: 0.0 standard drinks of alcohol   Drug use: Never   Sexual activity: Not Currently  Other Topics Concern   Not on file  Social History Narrative   Right handed    Social Determinants of Health   Financial Resource Strain: Low Risk  (07/08/2021)   Received from [facility], [facility]   Overall Financial Resource Strain (CARDIA)    Difficulty of Paying Living Expenses: Not hard at all  Food Insecurity: No Food Insecurity (07/08/2021)   Received from [facility], [facility]   Hunger Vital Sign    Worried About Running Out of Food in the Last Year: Never true    Ran Out of Food in the Last Year: Never true  Transportation Needs: No Transportation Needs (07/08/2021)   Received from [employer], [facility]   PRAPARE - Transportation    Lack of Transportation (Medical): No    Lack of Transportation (Non-Medical): No  Physical Activity: Insufficiently Active (07/08/2021)   Received from [facility], [facility]   Exercise Vital Sign    Days of Exercise per Week: 5 days    Minutes of Exercise per Session: 20 min  Stress: Stress Concern Present (06/11/2020)   Received from [facility], [facility] -  Occupational Stress Questionnaire    Feeling of Stress : To some extent  Social Connections: Unknown (01/21/2022)   Received from [facility], [facility]   Social Network    Social Network: Not on file  Intimate Partner Violence: Unknown (12/18/2021)   Received from [facility], [facility]   HITS    Physically Hurt: Not on file    Insult or Talk Down To: Not on file    Threaten Physical Harm: Not on file    Scream or Curse: Not on file     PHYSICAL EXAM: Vitals:   05/05/23 1348  BP: (!) 120/55  Pulse: 62  SpO2: 96%   General: No acute distress Head:  Normocephalic/atraumatic Skin/Extremities: No rash, no edema Neurological Exam: alert and awake. No aphasia or dysarthria. Fund of knowledge is appropriate.  Attention and concentration are normal.   Cranial nerves: Pupils equal, round. Extraocular movements intact with no nystagmus. Visual fields full.  No facial asymmetry.  Motor: Bulk and tone normal, muscle strength 5/5 throughout with no pronator drift.   Finger to nose testing intact.  Gait slightly wide-based, no ataxia.    IMPRESSION: This is a 67 yo RH man with a history of hypertension, hyperlipidemia, DM, CAD s/p CABG, ESRD on HD, paroxysmal atrial fibrillation, toxoplama uveitis, who has had admissions for altered mental status since 2019 and one witnessed episode in the ER last 07/2021 where he became unresponsive with gurgling respirations and appeared to have seizure-like activity in the setting of several metabolic derangements. Repeat brain MRI studies and routine EEGs, as well as prolonged 47-hour EEG were normal. Episodes appear to be more metabolic-related, he has weaned off Levetiracetam since October/November 2023 with no  recurrence of loss of consciousness. Follow-up as needed, call for any changes.   Thank you for allowing me to participate in his care.  Please do not hesitate to call for any questions or concerns.    Patrcia Dolly, M.D.   CC: Dr.  Carmelia Roller

## 2023-05-05 NOTE — Patient Instructions (Addendum)
? ???? ??? ?? ???. ?? ??? ???? ?? ??? ?????. ??? ?? ?? ??? ??? ?? ??? ?????.   jal jinaesineun moseub-i bogi johneyo. modeun uisawa gyesoghaeseo husog jochileul chwihasibsio. pil-yoe ttala husog jochileul chwihago byeongyeong sahang-eul yocheonghabnida.   Good to see you doing well. Continue follow-up with all your physicians. Follow-up as needed, call for any changes.

## 2023-05-11 ENCOUNTER — Encounter: Payer: 59 | Admitting: Family Medicine

## 2023-05-19 ENCOUNTER — Other Ambulatory Visit: Payer: Self-pay | Admitting: Family Medicine

## 2023-05-19 ENCOUNTER — Other Ambulatory Visit (HOSPITAL_BASED_OUTPATIENT_CLINIC_OR_DEPARTMENT_OTHER): Payer: Self-pay

## 2023-05-19 ENCOUNTER — Other Ambulatory Visit: Payer: Self-pay

## 2023-05-19 DIAGNOSIS — E1159 Type 2 diabetes mellitus with other circulatory complications: Secondary | ICD-10-CM

## 2023-05-19 DIAGNOSIS — F339 Major depressive disorder, recurrent, unspecified: Secondary | ICD-10-CM

## 2023-05-19 MED ORDER — CARVEDILOL 12.5 MG PO TABS
12.5000 mg | ORAL_TABLET | Freq: Two times a day (BID) | ORAL | 3 refills | Status: DC
  Filled 2023-05-19: qty 180, 90d supply, fill #0
  Filled 2023-09-01: qty 180, 90d supply, fill #1
  Filled 2024-03-20: qty 180, 90d supply, fill #2

## 2023-05-19 MED ORDER — DULOXETINE HCL 30 MG PO CPEP
30.0000 mg | ORAL_CAPSULE | Freq: Every day | ORAL | 3 refills | Status: DC
Start: 2023-05-19 — End: 2024-06-28
  Filled 2023-05-19: qty 90, 90d supply, fill #0
  Filled 2023-09-01: qty 90, 90d supply, fill #1
  Filled 2023-12-20 (×2): qty 90, 90d supply, fill #2
  Filled 2024-03-20: qty 90, 90d supply, fill #3

## 2023-05-24 ENCOUNTER — Other Ambulatory Visit (HOSPITAL_BASED_OUTPATIENT_CLINIC_OR_DEPARTMENT_OTHER): Payer: Self-pay

## 2023-05-24 MED ORDER — INFLUENZA VAC A&B SURF ANT ADJ 0.5 ML IM SUSY
0.5000 mL | PREFILLED_SYRINGE | Freq: Once | INTRAMUSCULAR | 0 refills | Status: AC
  Filled 2023-05-24: qty 0.5, 1d supply, fill #0

## 2023-05-24 MED ORDER — COVID-19 MRNA VAC-TRIS(PFIZER) 30 MCG/0.3ML IM SUSY
0.3000 mL | PREFILLED_SYRINGE | Freq: Once | INTRAMUSCULAR | 0 refills | Status: AC
  Filled 2023-05-24: qty 0.3, 1d supply, fill #0

## 2023-05-26 ENCOUNTER — Encounter: Payer: Self-pay | Admitting: Family Medicine

## 2023-05-26 ENCOUNTER — Ambulatory Visit (INDEPENDENT_AMBULATORY_CARE_PROVIDER_SITE_OTHER): Payer: 59 | Admitting: Family Medicine

## 2023-05-26 ENCOUNTER — Other Ambulatory Visit (HOSPITAL_BASED_OUTPATIENT_CLINIC_OR_DEPARTMENT_OTHER): Payer: Self-pay

## 2023-05-26 VITALS — BP 110/62 | HR 71 | Temp 98.7°F | Ht 65.0 in | Wt 122.1 lb

## 2023-05-26 DIAGNOSIS — Z125 Encounter for screening for malignant neoplasm of prostate: Secondary | ICD-10-CM | POA: Diagnosis not present

## 2023-05-26 DIAGNOSIS — Z0000 Encounter for general adult medical examination without abnormal findings: Secondary | ICD-10-CM

## 2023-05-26 MED ORDER — LIDOCAINE 5 % EX PTCH
1.0000 | MEDICATED_PATCH | Freq: Two times a day (BID) | CUTANEOUS | 1 refills | Status: DC | PRN
  Filled 2023-05-26: qty 30, 30d supply, fill #0
  Filled 2023-06-25: qty 30, 30d supply, fill #1

## 2023-05-26 NOTE — Patient Instructions (Signed)
Give us 2-3 business days to get the results of your labs back.   Keep the diet clean and stay active.  Please get me a copy of your advanced directive form at your convenience.   I recommend getting the flu shot in mid October. This suggestion would change if the CDC comes out with a different recommendation.   Let us know if you need anything.  

## 2023-05-26 NOTE — Progress Notes (Signed)
Chief Complaint  Patient presents with   Annual Exam    Well Male Kenneth [name] is here for a complete physical.   His last physical was >1 year ago.  Current diet: in general, a "healthy" diet.   Current exercise: limited Weight trend: stable Fatigue out of ordinary? No. Seat belt? Yes.   Advanced directive? Yes  Health maintenance Shingrix- No Colonoscopy- Yes Tetanus- Yes Hep C- Yes Pneumonia vaccine- Yes AAA- no aneurysm seen on CT imaging 02/2022  Past Medical History:  Diagnosis Date   Arthritis    hands   CAD (coronary artery disease) 10/30/2016   NSTEMI 9/17 with CABG x 3 (LIMA-LAD, SVG-OM, SVG-RCA).   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   type 1 DM   Dyspnea    with exertion   ESRD on hemodialysis (HCC)    "MWF; [place]" (03/30/2018)   Gastroesophageal reflux disease 08/18/2016   Hyperlipidemia    Hypertension    Ischemic rest pain of lower extremity 02/06/2015   Keratoma 02/27/2015   Metatarsal deformity 02/27/2015   PAF (paroxysmal atrial fibrillation) (HCC)    Pronation deformity of both feet 02/27/2015     Past Surgical History:  Procedure Laterality Date   A/V FISTULAGRAM Left 03/06/2021   Procedure: A/V FISTULAGRAM;  Surgeon: Cephus Shelling, MD;  Location: [facility];  Service: Cardiovascular;  Laterality: Left;   A/V FISTULAGRAM Left 06/02/2022   Procedure: A/V Fistulagram;  Surgeon: Nada Libman, MD;  Location: [facility];  Service: Cardiovascular;  Laterality: Left;   AV FISTULA PLACEMENT Left 06/22/2016   Procedure: ARTERIOVENOUS (AV) FISTULA CREATION LEFT UPPER ARM;  Surgeon: Larina Earthly, MD;  Location[facility] OR;  Service: Vascular;  Laterality: Left;   BIOPSY  12/19/2020   Procedure: BIOPSY;  Surgeon: Meryl Dare, MD;  Location: [facility] ENDOSCOPY;  Service: Endoscopy;;   BIOPSY   12/15/2022   Procedure: BIOPSY;  Surgeon: Sherrilyn Rist, MD;  Location: [facility] ENDOSCOPY;  Service: Gastroenterology;;   CARDIAC CATHETERIZATION N/A 05/31/2016   Procedure: Left Heart Cath and Coronary Angiography;  Surgeon: Corky Crafts, MD;  Location: [facility] INVASIVE CV LAB;  Service: Cardiovascular;  Laterality: N/A;   CARDIAC CATHETERIZATION N/A 05/31/2016   Procedure: Right Heart Cath;  Surgeon: Corky Crafts, MD;  Location: [facility] INVASIVE CV LAB;  Service: Cardiovascular;  Laterality: N/A;   CARDIAC CATHETERIZATION N/A 05/31/2016   Procedure: IABP Insertion;  Surgeon: Corky Crafts, MD;  Location: [facility];  Service: Cardiovascular;  Laterality: N/A;   COLONOSCOPY WITH PROPOFOL N/A 12/20/2020   Procedure: COLONOSCOPY WITH PROPOFOL;  Surgeon: Napoleon Form, MD;  Location: [facility] ENDOSCOPY;  Service: Endoscopy;  Laterality: N/A;   COLONOSCOPY WITH PROPOFOL N/A 12/15/2022   Procedure: COLONOSCOPY WITH PROPOFOL;  Surgeon: Sherrilyn Rist, MD;  Location: [facility] ENDOSCOPY;  Service: Gastroenterology;  Laterality: N/A;   CORONARY ARTERY BYPASS GRAFT N/A 06/05/2016   Procedure: CORONARY ARTERY BYPASS GRAFTING (CABG) x3 LIMA to LAD -SVG to OM -SVG to RCA;  Surgeon: Kerin Perna, MD;  Location: [facility] OR;  Service: Open Heart Surgery;  Laterality: N/A;   CORONARY STENT INTERVENTION N/A 07/07/2017   Procedure: CORONARY STENT INTERVENTION;  Surgeon: Iran Ouch, MD;  [facility]: [facility];  Service: Cardiovascular;  Laterality: N/A;   CORONARY STENT INTERVENTION N/A 03/30/2018   Procedure: CORONARY STENT INTERVENTION;  Surgeon: Swaziland, Peter M, MD;  Location: [facility] INVASIVE CV LAB;  Service: Cardiovascular;  Laterality: N/A;   ESOPHAGOGASTRODUODENOSCOPY (EGD) WITH PROPOFOL N/A 12/19/2020   Procedure: ESOPHAGOGASTRODUODENOSCOPY (EGD) WITH PROPOFOL;  Surgeon: Meryl Dare, MD;  Location: [facility] ENDOSCOPY;  Service: Endoscopy;  Laterality: N/A;   ESOPHAGOGASTRODUODENOSCOPY (EGD) WITH  PROPOFOL N/A 12/15/2022   Procedure: ESOPHAGOGASTRODUODENOSCOPY (EGD) WITH PROPOFOL;  Surgeon: Sherrilyn Rist, MD;  Location: [facility] ENDOSCOPY;  Service: Gastroenterology;  Laterality: N/A;   EXCHANGE OF A DIALYSIS CATHETER Right 04/16/2022   Procedure: EXCHANGE OF A DIALYSIS CATHETER;  Surgeon: Leonie Douglas, MD;  Location: [facility] OR;  Service: Vascular;  Laterality: Right;   GIVENS CAPSULE STUDY N/A 12/20/2020   Procedure: GIVENS CAPSULE STUDY;  Surgeon: Napoleon Form, MD;  Location: [facility] ENDOSCOPY;  Service: Endoscopy;  Laterality: N/A;   HEMOSTASIS CLIP PLACEMENT  12/15/2022   Procedure: HEMOSTASIS CLIP PLACEMENT;  Surgeon: Sherrilyn Rist, MD;  Location: [facility] ENDOSCOPY;  Service: Gastroenterology;;   INSERTION OF DIALYSIS CATHETER N/A 06/05/2016   Procedure: INSERTION OF DIALYSIS/trialysis CATHETER;  Surgeon: Kerin Perna, MD;  Location: [facility] OR;  Service: Vascular;  Laterality: N/A;   INSERTION OF DIALYSIS CATHETER Right 06/13/2016   Procedure: INSERTION OF DIALYSIS CATHETER RIGHT INTERNAL JUGULAR;  Surgeon: Fransisco Hertz, MD;  Location: [facility] OR;  Service: Vascular;  Laterality: Right;   INSERTION OF DIALYSIS CATHETER Right 09/07/2019   Procedure: Insertion Of Dialysis Catheter;  Surgeon: Larina Earthly, MD;  Location: [facility] OR;  Service: Vascular;  Laterality: Right;   INSERTION OF DIALYSIS CATHETER N/A 04/09/2022   Procedure: INSERTION OF TUNNELED DIALYSIS CATHETER;  Surgeon: Leonie Douglas, MD;  Location: [facility] OR;  Service: Vascular;  Laterality: N/A;   INTRAOPERATIVE TRANSESOPHAGEAL ECHOCARDIOGRAM N/A 06/05/2016   Procedure: INTRAOPERATIVE TRANSESOPHAGEAL ECHOCARDIOGRAM;  Surgeon: Kerin Perna, MD;  Location: [facility] OR;  Service: Open Heart Surgery;  Laterality: N/A;   LEFT HEART CATH AND CORS/GRAFTS ANGIOGRAPHY N/A 11/03/2016   Procedure: Left Heart Cath and Cors/Grafts Angiography;  Surgeon: Lennette Bihari, MD;  Location: [facility];  Service: Cardiovascular;  Laterality: N/A;   LEFT HEART  CATH AND CORS/GRAFTS ANGIOGRAPHY N/A 07/07/2017   Procedure: LEFT HEART CATH AND CORS/GRAFTS ANGIOGRAPHY;  Surgeon: Laurey Morale, MD;  Location: [facility] INVASIVE CV LAB;  Service: Cardiovascular;  Laterality: N/A;   LEFT HEART CATH AND CORS/GRAFTS ANGIOGRAPHY N/A 03/30/2018   Procedure: LEFT HEART CATH AND CORS/GRAFTS ANGIOGRAPHY;  Surgeon: Swaziland, Peter M, MD;  Location: [facility] INVASIVE CV LAB;  Service: Cardiovascular;  Laterality: N/A;   LEFT HEART CATH AND CORS/GRAFTS ANGIOGRAPHY N/A 04/17/2019   Procedure: LEFT HEART CATH AND CORS/GRAFTS ANGIOGRAPHY;  Surgeon: Laurey Morale, MD;  Location: [facility] INVASIVE CV LAB;  Service: Cardiovascular;  Laterality: N/A;   LUMBAR LAMINECTOMY/DECOMPRESSION MICRODISCECTOMY Bilateral 09/01/2022   Procedure: Laminectomy and Foraminotomy - bilateral - Lumbar three-Lumbar four;  Surgeon: Julio Sicks, MD;  Location: [facility] OR;  Service: Neurosurgery;  Laterality: Bilateral;   PERIPHERAL VASCULAR BALLOON ANGIOPLASTY Left 06/02/2022   Procedure: PERIPHERAL VASCULAR BALLOON ANGIOPLASTY;  Surgeon: Nada Libman, MD;  Location: [facility];  Service: Cardiovascular;  Laterality: Left;   PERIPHERAL VASCULAR INTERVENTION Left 03/06/2021   Procedure: PERIPHERAL VASCULAR INTERVENTION;  Surgeon: Cephus Shelling, MD;  Location: [facility];  Service: Cardiovascular;  Laterality: Left;   POLYPECTOMY  12/20/2020   Procedure:  POLYPECTOMY;  Surgeon: Napoleon Form, MD;  Location: [facility] ENDOSCOPY;  Service: Endoscopy;;   POLYPECTOMY  12/15/2022   Procedure: POLYPECTOMY;  Surgeon: Sherrilyn Rist, MD;  Location: [facility] ENDOSCOPY;  Service: Gastroenterology;;   REVISION OF ARTERIOVENOUS GORETEX GRAFT Left 07/27/2019   Procedure: PLICATION OF ANEURYSM OF ARTERIOVENOUS FISTULA  LEFT ARM;  Surgeon: Larina Earthly, MD;  Location: [facility] OR;  Service: Vascular;  Laterality: Left;   REVISON OF ARTERIOVENOUS FISTULA Left 09/07/2019   Procedure: REVISON OF LEFT UPPER ARM ARTERIOVENOUS  FISTULA;  Surgeon: Larina Earthly, MD;  Location: [facility] OR;  Service: Vascular;  Laterality: Left;   REVISON OF ARTERIOVENOUS FISTULA Left 04/09/2022   Procedure: LEFT ARM FISTULA REVISION;  Surgeon: Leonie Douglas, MD;  Location: [facility] OR;  Service: Vascular;  Laterality: Left;  PERIPHERAL NERVE BLOCK   REVISON OF ARTERIOVENOUS FISTULA Left 04/16/2022   Procedure: REVISON OF ARTERIOVENOUS FISTULA;  Surgeon: Leonie Douglas, MD;  Location: [facility] OR;  Service: Vascular;  Laterality: Left;   RIGHT/LEFT HEART CATH AND CORONARY ANGIOGRAPHY N/A 01/03/2021   Procedure: RIGHT/LEFT HEART CATH AND CORONARY ANGIOGRAPHY;  Surgeon: Laurey Morale, MD;  Location: [facility] INVASIVE CV LAB;  Service: Cardiovascular;  Laterality: N/A;    Medications  Current Outpatient Medications on File Prior to Visit  Medication Sig Dispense Refill   apixaban (ELIQUIS) 5 MG TABS tablet Take 1 tablet (5 mg total) by mouth 2 (two) times daily. 180 tablet 2   atorvastatin (LIPITOR) 80 MG tablet Take 1 tablet (80 mg total) by mouth daily. 90 tablet 3   calcium acetate (PHOSLO) 667 MG capsule Take 667 mg by mouth 3 (three) times daily with meals.      carvedilol (COREG) 12.5 MG tablet Take 1 tablet (12.5 mg total) by mouth 2 (two) times daily with a meal. 180 tablet 3   clobetasol (TEMOVATE) 0.05 % external solution Apply 1 Application topically 2 (two) times daily. 50 mL 0   Continuous Blood Gluc Sensor (FREESTYLE LIBRE 2 SENSOR) MISC 1 Device by Does not apply route every 14 (fourteen) days. 6 each 3   Continuous Blood Gluc Sensor (GUARDIAN SENSOR 3) MISC 1 Device by Does not apply route as directed. 9 each 3   Continuous Blood Gluc Transmit (GUARDIAN LINK 3 TRANSMITTER) MISC 1 Device by Does not apply route as directed. 1 each 3   DULoxetine (CYMBALTA) 30 MG capsule Take 1 capsule (30 mg total) by mouth daily. 90 capsule 3   ezetimibe (ZETIA) 10 MG tablet Take 1 tablet by mouth daily 90 tablet 3   glucose blood (ONETOUCH VERIO) test strip 1  each by Other route in the morning, at noon, in the evening, and at bedtime. Use as instructed 400 each 2   hydrALAZINE (APRESOLINE) 100 MG tablet Take 1 tablet (100 mg total) by mouth 3 (three) times daily. DO NOT TAKE AM AND NOON DOSES ON HD DAYS. 270 tablet 2   HYDROcodone-acetaminophen (NORCO) 10-325 MG tablet Take 1 tablet by mouth every 4 to 6 hours as needed for pain. 60 tablet 0   HYDROcodone-acetaminophen (NORCO) 10-325 MG tablet Take 1 tablet by mouth every 4 to 6 hours as needed for pain 60 tablet 0   HYDROcodone-acetaminophen (NORCO) 10-325 MG tablet Take 1 tablet by mouth every 4 to 6 hours as needed for pain 60 tablet 0   HYDROcodone-acetaminophen (NORCO) 10-325 MG tablet Take 1 tablet by mouth every 4 - 6 hours as needed for pain 60  tablet 0   Insulin Lispro-aabc (LYUMJEV) 100 UNIT/ML SOLN Max Daily 30 units per insulin pump 30 mL 3   Insulin Pen Needle (PEN NEEDLES) 32G X 6 MM MISC Use as directed 3 times daily 300 each 1   isosorbide mononitrate (IMDUR) 60 MG 24 hr tablet Take 1&1/2 tablets (90 mg total) by mouth daily. Hold on days of dialysis on Mon, Wed, Fri. 90 tablet 5   megestrol (MEGACE ES) 625 MG/5ML suspension Take 5 mLs (625 mg total) by mouth daily. 150 mL 1   Methoxy PEG-Epoetin Beta (MIRCERA IJ) Inject as directed.     pantoprazole (PROTONIX) 40 MG tablet Take 1 tablet (40 mg total) by mouth 2 (two) times daily. 180 tablet 1   prazosin (MINIPRESS) 1 MG capsule Take 1 capsule (1 mg total) by mouth at bedtime. 90 capsule 3   prednisoLONE acetate (PRED FORTE) 1 % ophthalmic suspension Place 1 drop into the left eye 4 (four) times daily.     sodium zirconium cyclosilicate (LOKELMA) 10 g PACK packet Take 1 packet dissolved in water as directed as directed Take on nondialysis days 3 times a week. Dissolve in 2-3 ounces of water 13 packet 11   Tiotropium Bromide-Olodaterol (STIOLTO RESPIMAT) 2.5-2.5 MCG/ACT AERS Inhale 2 puffs by mouth into the lungs daily. 4 g 11    triamcinolone cream (KENALOG) 0.1 % Apply 1 Application topically 2 (two) times daily. 30 g 0    Allergies No Known Allergies  Family History Family History  Problem Relation Age of Onset   CAD Father    Colon cancer Father    Diabetes Brother    Sleep apnea Son     Review of Systems: Constitutional:  no fevers Eye:  no recent significant change in vision Ears:  No changes in hearing Nose/Mouth/Throat:  no complaints of nasal congestion, no sore throat Cardiovascular: no chest pain Respiratory:  No shortness of breath Gastrointestinal:  No change in bowel habits GU:  No frequency Integumentary:  no abnormal skin lesions reported Neurologic:  no headaches Endocrine:  denies unexplained weight changes  Exam BP 110/62 (BP Location: Left Arm, Patient Position: Sitting, Cuff Size: Normal)   Pulse 71   Temp 98.7 F (37.1 C) (Oral)   Ht 5\' 5"  (1.651 m)   Wt 122 lb 2 oz (55.4 kg)   SpO2 93%   BMI 20.32 kg/m  General:  well developed, well nourished, in no apparent distress Skin:  no significant moles, warts, or growths Head:  no masses, lesions, or tenderness Eyes:  pupils equal and round, sclera anicteric without injection Ears:  canals without lesions, TMs shiny without retraction, no obvious effusion, no erythema Nose:  nares patent, mucosa normal Throat/Pharynx:  lips and gingiva without lesion; tongue and uvula midline; non-inflamed pharynx; no exudates or postnasal drainage Lungs:  clear to auscultation, breath sounds equal bilaterally, no respiratory distress Cardio:  regular rate and rhythm, no LE edema or bruits Rectal: Deferred GI: BS+, S, NT, ND, no masses or organomegaly Musculoskeletal:  symmetrical muscle groups noted without atrophy or deformity Neuro:  gait normal; deep tendon reflexes normal and symmetric Psych: well oriented with normal range of affect and appropriate judgment/insight  Assessment and Plan  Well adult exam - Plan: CBC, Comprehensive  metabolic panel, Lipid panel  Screening for prostate cancer - Plan: PSA, Medicare ( [place] only)   Well 66 y.o. male. Counseled on diet and exercise. Advanced directive form provided today.  Discussed prostate cancer screening, agreed  to undergo PSA testing. Other orders as above. Follow up in 6 mo.  The patient voiced understanding and agreement to the plan.  Jilda Roche Mullen, DO 05/26/23 9:37 AM

## 2023-06-02 ENCOUNTER — Other Ambulatory Visit (HOSPITAL_BASED_OUTPATIENT_CLINIC_OR_DEPARTMENT_OTHER): Payer: Self-pay

## 2023-06-03 NOTE — Progress Notes (Signed)
Name: Kenneth [name]  MRN/ DOB: [id number], 04/11/1956   Age/ Sex: 67 y.o., male    PCP: Sharlene Dory, DO   Reason for Endocrinology Evaluation: Type 2 Diabetes Mellitus     Date of Initial Endocrinology Visit: 10/28/2021    PATIENT IDENTIFIER: [name] is a 68 y.o. male with a past medical history of T2DM, CAD, CHF , HTN and ESRD on HD since 2017. The patient presented for initial endocrinology clinic visit on 10/28/2021 for consultative assistance with his diabetes management.    HPI: [name] transitioned care from Dr. Shawnee Knapp . He is accompanied by his wife and interpreter    Diagnosed with DM years ago  > 20 yrs ago . Islet cell Ab negative 2021 Currently checking blood sugars multiple  x / day,  through freestyle libre   Hypoglycemia episodes : yes                Symptoms: shaking                  Frequency: 1/ week Hemoglobin A1c has ranged from 5.2% in 2022, peaking at 8.1% in 2021.   In terms of diet, the patient eats 2 meals a day. Snacks occasionally     Nephrology : Dr. Kathrene Bongo . Pt on HD  Monday, Wednesday and Friday  Pulmonary : Dr. Judeth Horn  10/2021 for dyspnea   He was trained on the Guardian sensor to our CDE 01/2023  SUBJECTIVE:   During the last visit (11/30/2022): A1c 9.0%       Today (06/04/23): [name] is here for follow-up on diabetes management.  He is accompanied by his wife . Interpreter line was used .     He  checks his blood sugars multiple times daily, through CGM. The patient has had hypoglycemic episodes since the last clinic visit, he is symptomatic with these episodes.    He continues to follow-up with [facility] kidney transplant team , Kenneth [name]   Denies nausea or vomiting  Denies constipation or diarrhea  He has been noted with hypotension per pt , DBP at home 28-40 mmhg, associated with dizziness, he is on Prazosin     This patient with type 2 diabetes is treated with Medtronic  (insulin pump). During the visit  the pump basal and bolus doses were reviewed including carb/insulin rations and supplemental doses. The clinical list was updated. The glucose meter download was reviewed in detail to determine if the current pump settings are providing the best glycemic control without excessive hypoglycemia.  Pump and meter download:       Pump   Medtronic 630 G Settings   Insulin type   Lyumjev   Basal rate       0000 0.475 u/h    0600 0.475 u/h    1800 0.575 u/h       I:C ratio       0000 1:10                  Sensitivity       0000  55      Goal       0000  110              Type & Model of Pump: medtronic  Insulin Type: Currently using lyumjev   PUMP STATISTICS: Average BG: 203 -/+ 78 BG Readings: 1.5/ day Average Daily Carbs (g): 5.4 Average Total Daily Insulin: 15.2 +/- 2.8 Average Daily  Basal: 11.9(78 %) Average Daily Bolus: 3.3 (22%)     CONTINUOUS GLUCOSE MONITORING RECORD INTERPRETATION    Dates of Recording: 9/7-9/20/2024  Sensor description:freestyle libre   Results statistics:   CGM use % of time 36  Average and SD 245/36.4  Time in range 22 %  % Time Above 180 34  % Time above 250 42  % Time Below target 2    Glycemic patterns summary:Bg's continues to fluctuate during the day and night  but mostly remain high   Hyperglycemic episodes  postprandial   Hypoglycemic episodes occurred at night    Overnight periods: trends downs     HOME DIABETES REGIMEN: Lyumjev      Statin: yes  ACE-I/ARB: no      DIABETIC COMPLICATIONS: Microvascular complications:  ESRD on HD , neuropathy Denies: retinopathy  Last eye exam: Completed   Macrovascular complications:  CAD ( S/P CABG 2017) Denies: PVD, CVA   PAST HISTORY: Past Medical History:  Past Medical History:  Diagnosis Date   Arthritis    hands   CAD (coronary artery disease) 10/30/2016   NSTEMI 9/17 with CABG x 3 (LIMA-LAD, SVG-OM, SVG-RCA).   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   type 1 DM   Dyspnea    with exertion   ESRD on hemodialysis (HCC)    "MWF; [place]" (03/30/2018)   Gastroesophageal reflux disease 08/18/2016   Hyperlipidemia    Hypertension    Ischemic rest pain of lower extremity 02/06/2015   Keratoma 02/27/2015   Metatarsal deformity 02/27/2015   PAF (paroxysmal atrial fibrillation) (HCC)    Pronation deformity of both feet 02/27/2015   Past Surgical History:  Past Surgical History:  Procedure Laterality Date   A/V FISTULAGRAM Left 03/06/2021   Procedure: A/V FISTULAGRAM;  Surgeon: Cephus Shelling, MD;  [facility];  Service: Cardiovascular;  Laterality: Left;   A/V FISTULAGRAM Left 06/02/2022   Procedure: A/V Fistulagram;  Surgeon: Nada Libman, MD;  [facility];  Service: Cardiovascular;  Laterality: Left;   AV FISTULA PLACEMENT Left 06/22/2016   Procedure: ARTERIOVENOUS (AV) FISTULA CREATION LEFT UPPER ARM;  Surgeon: Larina Earthly, MD;  Location: [facility] OR;  Service: Vascular;  Laterality: Left;   BIOPSY  12/19/2020   Procedure: BIOPSY;  Surgeon: Meryl Dare, MD;  Location: [facility] ENDOSCOPY;  Service: Endoscopy;;   BIOPSY  12/15/2022   Procedure: BIOPSY;  Surgeon: Sherrilyn Rist, MD;  Location: [facility] ENDOSCOPY;  Service: Gastroenterology;;   CARDIAC CATHETERIZATION N/A 05/31/2016   Procedure: Left Heart Cath and Coronary Angiography;  Surgeon: Corky Crafts, MD;  Location: [facility] INVASIVE CV LAB;  Service: Cardiovascular;  Laterality: N/A;   CARDIAC CATHETERIZATION N/A 05/31/2016   Procedure: Right Heart Cath;  Surgeon: Corky Crafts, MD;  Location: [facility] INVASIVE CV LAB;  Service: Cardiovascular;  Laterality: N/A;   CARDIAC CATHETERIZATION N/A 05/31/2016   Procedure: IABP Insertion;  Surgeon: Corky Crafts, MD;  [facility];   Service: Cardiovascular;  Laterality: N/A;   COLONOSCOPY WITH PROPOFOL N/A 12/20/2020   Procedure: COLONOSCOPY WITH PROPOFOL;  Surgeon: Napoleon Form, MD;  [facility];  Service: Endoscopy;  Laterality: N/A;   COLONOSCOPY WITH PROPOFOL N/A 12/15/2022   Procedure: COLONOSCOPY WITH PROPOFOL;  Surgeon: Myrtie Neither,  Andreas Blower, MD;  Location: [facility] ENDOSCOPY;  Service: Gastroenterology;  Laterality: N/A;   CORONARY ARTERY BYPASS GRAFT N/A 06/05/2016   Procedure: CORONARY ARTERY BYPASS GRAFTING (CABG) x3 LIMA to LAD -SVG to OM -SVG to RCA;  Surgeon: Kerin Perna, MD;  Location: [facility] OR;  Service: Open Heart Surgery;  Laterality: N/A;   CORONARY STENT INTERVENTION N/A 07/07/2017   Procedure: CORONARY STENT INTERVENTION;  Surgeon: Iran Ouch, MD;  [facility];  Service: Cardiovascular;  Laterality: N/A;   CORONARY STENT INTERVENTION N/A 03/30/2018   Procedure: CORONARY STENT INTERVENTION;  Surgeon: Swaziland, Peter M, MD;  Location: [facility] INVASIVE CV LAB;  Service: Cardiovascular;  Laterality: N/A;   ESOPHAGOGASTRODUODENOSCOPY (EGD) WITH PROPOFOL N/A 12/19/2020   Procedure: ESOPHAGOGASTRODUODENOSCOPY (EGD) WITH PROPOFOL;  Surgeon: Meryl Dare, MD;  Location: [facility] ENDOSCOPY;  Service: Endoscopy;  Laterality: N/A;   ESOPHAGOGASTRODUODENOSCOPY (EGD) WITH PROPOFOL N/A 12/15/2022   Procedure: ESOPHAGOGASTRODUODENOSCOPY (EGD) WITH PROPOFOL;  Surgeon: Sherrilyn Rist, MD;  Location: [facility] ENDOSCOPY;  Service: Gastroenterology;  Laterality: N/A;   EXCHANGE OF A DIALYSIS CATHETER Right 04/16/2022   Procedure: EXCHANGE OF A DIALYSIS CATHETER;  Surgeon: Leonie Douglas, MD;  Location: [facility] OR;  Service: Vascular;  Laterality: Right;   GIVENS CAPSULE STUDY N/A 12/20/2020   Procedure: GIVENS CAPSULE STUDY;  Surgeon: Napoleon Form, MD;  [facility];  Service: Endoscopy;  Laterality: N/A;   HEMOSTASIS CLIP PLACEMENT  12/15/2022   Procedure: HEMOSTASIS CLIP PLACEMENT;  Surgeon: Sherrilyn Rist, MD;  Location: [facility] ENDOSCOPY;  Service: Gastroenterology;;   INSERTION OF DIALYSIS CATHETER N/A 06/05/2016   Procedure: INSERTION OF DIALYSIS/trialysis CATHETER;  Surgeon: Kerin Perna, MD;  Location: [facility] OR;  Service: Vascular;  Laterality: N/A;   INSERTION OF DIALYSIS CATHETER Right 06/13/2016   Procedure: INSERTION OF DIALYSIS CATHETER RIGHT INTERNAL JUGULAR;  Surgeon: Fransisco Hertz, MD;  Location: [facility] OR;  Service: Vascular;  Laterality: Right;   INSERTION OF DIALYSIS CATHETER Right 09/07/2019   Procedure: Insertion Of Dialysis Catheter;  Surgeon: Larina Earthly, MD;  Location: [facility] OR;  Service: Vascular;  Laterality: Right;   INSERTION OF DIALYSIS CATHETER N/A 04/09/2022   Procedure: INSERTION OF TUNNELED DIALYSIS CATHETER;  Surgeon: Leonie Douglas, MD;  Location: [facility] OR;  Service: Vascular;  Laterality: N/A;   INTRAOPERATIVE TRANSESOPHAGEAL ECHOCARDIOGRAM N/A 06/05/2016   Procedure: INTRAOPERATIVE TRANSESOPHAGEAL ECHOCARDIOGRAM;  Surgeon: Kerin Perna, MD;  Location: [facility] OR;  Service: Open Heart Surgery;  Laterality: N/A;   LEFT HEART CATH AND CORS/GRAFTS ANGIOGRAPHY N/A 11/03/2016   Procedure: Left Heart Cath and Cors/Grafts Angiography;  Surgeon: Lennette Bihari, MD;  [facility];  Service: Cardiovascular;  Laterality: N/A;   LEFT HEART CATH AND CORS/GRAFTS ANGIOGRAPHY N/A 07/07/2017   Procedure: LEFT HEART CATH AND CORS/GRAFTS ANGIOGRAPHY;  Surgeon: Laurey Morale, MD;  Location: [facility] INVASIVE CV LAB;  Service: Cardiovascular;  Laterality: N/A;   LEFT HEART CATH AND CORS/GRAFTS ANGIOGRAPHY N/A 03/30/2018   Procedure: LEFT HEART CATH AND CORS/GRAFTS ANGIOGRAPHY;  Surgeon: Swaziland, Peter M, MD;  Location: [facility] INVASIVE CV LAB;  Service: Cardiovascular;  Laterality: N/A;   LEFT HEART CATH AND CORS/GRAFTS ANGIOGRAPHY N/A 04/17/2019   Procedure: LEFT HEART CATH AND CORS/GRAFTS ANGIOGRAPHY;  Surgeon: Laurey Morale, MD;  Location: [facility] INVASIVE CV LAB;  Service: Cardiovascular;   Laterality: N/A;   LUMBAR LAMINECTOMY/DECOMPRESSION MICRODISCECTOMY Bilateral 09/01/2022   Procedure: Laminectomy and Foraminotomy - bilateral - Lumbar three-Lumbar four;  Surgeon: Julio Sicks, MD;  Location: [facility] OR;  Service: Neurosurgery;  Laterality: Bilateral;   PERIPHERAL VASCULAR BALLOON ANGIOPLASTY Left 06/02/2022   Procedure: PERIPHERAL VASCULAR BALLOON ANGIOPLASTY;  Surgeon: Nada Libman, MD;  [facility];  Service: Cardiovascular;  Laterality: Left;   PERIPHERAL VASCULAR INTERVENTION Left 03/06/2021   Procedure: PERIPHERAL VASCULAR INTERVENTION;  Surgeon: Cephus Shelling, MD;  [facility];  Service: Cardiovascular;  Laterality: Left;   POLYPECTOMY  12/20/2020   Procedure: POLYPECTOMY;  Surgeon: Napoleon Form, MD;  [facility] ENDOS[facility];  Service: Endoscopy;;   POLYPECTOMY  12/15/2022   Procedure: POLYPECTOMY;  Surgeon: Sherrilyn Rist, MD;  Location: [facility] ENDOSCOPY;  Service: Gastroenterology;;   REVISION OF ARTERIOVENOUS GORETEX GRAFT Left 07/27/2019   Procedure: PLICATION OF ANEURYSM OF ARTERIOVENOUS FISTULA  LEFT ARM;  Surgeon: Larina Earthly, MD;  [facility];  Service: Vascular;  Laterality: Left;   REVISON OF ARTERIOVENOUS FISTULA Left 09/07/2019   Procedure: REVISON OF LEFT UPPER ARM ARTERIOVENOUS FISTULA;  Surgeon: Larina Earthly, MD;  [facility];  Service: Vascular;  Laterality: Left;   REVISON OF ARTERIOVENOUS FISTULA Left 04/09/2022   Procedure: LEFT ARM FISTULA REVISION;  Surgeon: Leonie Douglas, MD;  [facility];  Service: Vascular;  Laterality: Left;  PERIPHERAL NERVE BLOCK   REVISON OF ARTERIOVENOUS FISTULA Left 04/16/2022   Procedure: REVISON OF ARTERIOVENOUS FISTULA;  Surgeon: Leonie Douglas, MD;  Location: [facility] OR;  Service: Vascular;  Laterality: Left;   RIGHT/LEFT HEART CATH AND CORONARY ANGIOGRAPHY N/A 01/03/2021   Procedure: RIGHT/LEFT HEART CATH AND CORONARY ANGIOGRAPHY;  Surgeon: Laurey Morale, MD;   Location: [facility] INVASIVE CV LAB;  Service: Cardiovascular;  Laterality: N/A;    Social History:  reports that he quit smoking about 10 years ago. His smoking use included cigarettes. He started smoking about 45 years ago. He has a 52.5 pack-year smoking history. He has never been exposed to tobacco smoke. He has never used smokeless tobacco. He reports that he does not drink alcohol and does not use drugs. Family History:  Family History  Problem Relation Age of Onset   CAD Father    Colon cancer Father    Diabetes Brother    Sleep apnea Son      HOME MEDICATIONS: Allergies as of 06/04/2023   No Known Allergies      Medication List        Accurate as of June 04, 2023 11:40 AM. If you have any questions, ask your nurse or doctor.          STOP taking these medications    prazosin 1 MG capsule Commonly known as: MINIPRESS Stopped by: Johnney Ou Rebbeca Sheperd       TAKE these medications    atorvastatin 80 MG tablet Commonly known as: LIPITOR Take 1 tablet (80 mg total) by mouth daily.   calcium acetate 667 MG capsule Commonly known as: PHOSLO Take 667 mg by mouth 3 (three) times daily with meals.   carvedilol 12.5 MG tablet Commonly known as: COREG Take 1 tablet (12.5 mg total) by mouth 2 (two) times daily with a meal.   clobetasol 0.05 % external solution Commonly known as: TEMOVATE Apply 1 Application topically 2 (two) times daily.   DULoxetine 30 MG capsule Commonly known as: Cymbalta Take 1 capsule (30 mg total) by mouth daily.   Eliquis 5 MG Tabs tablet Generic drug: apixaban Take 1 tablet (5 mg total) by mouth  2 (two) times daily.   ezetimibe 10 MG tablet Commonly known as: ZETIA Take 1 tablet by mouth daily   FreeStyle Libre 2 Sensor Misc 1 Device by Does not apply route every 14 (fourteen) days.   Guardian Sensor 3 Misc 1 Device by Does not apply route as directed.   Guardian Link 3 Transmitter Misc 1 Device by Does not apply route as  directed.   hydrALAZINE 100 MG tablet Commonly known as: APRESOLINE Take 1 tablet (100 mg total) by mouth 3 (three) times daily. DO NOT TAKE AM AND NOON DOSES ON HD DAYS.   HYDROcodone-acetaminophen 10-325 MG tablet Commonly known as: NORCO Take 1 tablet by mouth every 4 to 6 hours as needed for pain.   HYDROcodone-acetaminophen 10-325 MG tablet Commonly known as: NORCO Take 1 tablet by mouth every 4 to 6 hours as needed for pain   HYDROcodone-acetaminophen 10-325 MG tablet Commonly known as: NORCO Take 1 tablet by mouth every 4 to 6 hours as needed for pain   HYDROcodone-acetaminophen 10-325 MG tablet Commonly known as: NORCO Take 1 tablet by mouth every 4 - 6 hours as needed for pain   iron sucrose in sodium chloride 0.9 % 100 mL Iron Sucrose (Venofer)   isosorbide mononitrate 60 MG 24 hr tablet Commonly known as: IMDUR Take 1&1/2 tablets (90 mg total) by mouth daily. Hold on days of dialysis on Mon, Wed, Fri.   lidocaine 5 % Commonly known as: LIDODERM Place 1 patch onto the skin  as directed. May wear upto12 hours   Lokelma 10 g Pack packet Generic drug: sodium zirconium cyclosilicate Take 1 packet dissolved in water as directed as directed Take on nondialysis days 3 times a week. Dissolve in 2-3 ounces of water   Lyumjev 100 UNIT/ML Soln Generic drug: Insulin Lispro-aabc Max Daily 30 units per insulin pump   megestrol 625 MG/5ML suspension Commonly known as: MEGACE ES Take 5 mLs (625 mg total) by mouth daily.   MIRCERA IJ Inject as directed.   OneTouch Verio test strip Generic drug: glucose blood 1 each by Other route in the morning, at noon, in the evening, and at bedtime. Use as instructed   pantoprazole 40 MG tablet Commonly known as: PROTONIX Take 1 tablet (40 mg total) by mouth 2 (two) times daily.   prednisoLONE acetate 1 % ophthalmic suspension Commonly known as: PRED FORTE Place 1 drop into the left eye 4 (four) times daily.   Stiolto Respimat  2.5-2.5 MCG/ACT Aers Generic drug: Tiotropium Bromide-Olodaterol Inhale 2 puffs by mouth into the lungs daily.   TechLite Pen Needles 32G X 6 MM Misc Generic drug: Insulin Pen Needle Use as directed 3 times daily   triamcinolone cream 0.1 % Commonly known as: KENALOG Apply 1 Application topically 2 (two) times daily.         ALLERGIES: No Known Allergies   REVIEW OF SYSTEMS: A comprehensive ROS was conducted with the patient and is negative except as per HPI     OBJECTIVE:   VITAL SIGNS: BP 100/68 (BP Location: Left Arm, Patient Position: Sitting, Cuff Size: Small)   Pulse (!) 50   Ht 5\' 5"  (1.651 m)   Wt 130 lb 12.8 oz (59.3 kg)   SpO2 93%   BMI 21.77 kg/m    PHYSICAL EXAM:  General: Pt appears well and is in NAD  Lungs: Clear with good BS bilat   Heart: RRR   Extremities:  Lower extremities - No pretibial edema.  Neuro:  MS is good with appropriate affect, pt is alert and Ox3    DM foot exam: 11/10/2022 per podiatry      DATA REVIEWED:  Lab Results  Component Value Date   HGBA1C 9.4 (A) 06/04/2023   HGBA1C 7.8 (A) 11/30/2022   HGBA1C 7.6 (A) 08/04/2022    Latest Reference Range & Units 12/15/22 07:31  Sodium 135 - 145 mmol/L 138  Potassium 3.5 - 5.1 mmol/L 4.3  Chloride 98 - 111 mmol/L 97 (L)  Glucose 70 - 99 mg/dL 952 (H)  BUN 8 - 23 mg/dL 18  Creatinine 8.41 - 3.24 mg/dL 4.01 (H)  Calcium Ionized 1.15 - 1.40 mmol/L 1.03 (L)  (L): Data is abnormally low (H): Data is abnormally high  ASSESSMENT / PLAN / RECOMMENDATIONS:   1) Type 2 Diabetes Mellitus, poorly controlled , With Neuropathic, ESRD on HD and macrovascular  complications - Most recent A1c of 9.4 %. Goal A1c < 7.0 %.     - A1c skewed due to ESRD  -Barriers to diabetes self-care is language -I had filled the papers for the guardian sensor multiple times, but the patient states he has not received the shipment yet, I suspect this has to do with the language barriers as most of the DME  supplies do not have an interpreter, and he may have to contact the company and use his own personal interpreter -Patient has been noted worsening hyperglycemia, despite multiple attempts at advising him to enter carbohydrates for boluses he continues not to do so, and depends on his basal rate and correction factor -Unfortunately he is not using the pump correctly hence glycemic excursions -I have decreased his basal rate at night due to hypoglycemia -He was again strongly encouraged to count carbohydrates and entered into the pump with a meal, so he would receive the correct bolus   - MEDICATIONS:     Pump   Medtronic 630 G Settings   Insulin type   Lyumjev   Basal rate       0000 0.450  u/h    0600 0.475 u/h    1800 0.550u/h       I:C ratio       0000 1:10                  Sensitivity       0000  55      Goal       0000  110         EDUCATION / INSTRUCTIONS: BG monitoring instructions: Patient is instructed to check his blood sugars 3 times a day, before meals . Call [facility] if: BG persistently < 70  I reviewed the Rule of 15 for the treatment of hypoglycemia in detail with the patient. Literature supplied.   2) Diabetic complications:  Eye: Does not have known diabetic retinopathy.  Neuro/ Feet: Does  have known diabetic peripheral neuropathy. Renal: Patient does  have known baseline CKD. He is  on an ACEI/ARB at present.  3) Hypotension :  -Patient has been noted with hypotension at home, associated with dizziness -I will stop his prazosin   Medication - STOP Prazosin   Follow-up in 6 months  Signed electronically by: Lyndle Herrlich, MD  [facility] Endocr[facility]ology  [facility] [address] [place], [place] [address] Phone: [phone] FAX: [phone]   CC: Sharlene Dory, DO [address] [place] [address] Phone: [phone]  Fax:  [phone]    Return to  Endocrinology clinic as below: Future Appointments  Date Time Provider Department Center  08/09/2023 11:30 AM [facility] PA/NP [facility] None  10/08/2023 11:50 AM Rasul Decola, Konrad Dolores, MD [facility] None

## 2023-06-04 ENCOUNTER — Telehealth: Payer: Self-pay | Admitting: Internal Medicine

## 2023-06-04 ENCOUNTER — Encounter: Payer: Self-pay | Admitting: Internal Medicine

## 2023-06-04 ENCOUNTER — Ambulatory Visit (INDEPENDENT_AMBULATORY_CARE_PROVIDER_SITE_OTHER): Payer: 59 | Admitting: Internal Medicine

## 2023-06-04 VITALS — BP 100/68 | HR 50 | Ht 65.0 in | Wt 130.8 lb

## 2023-06-04 DIAGNOSIS — Z794 Long term (current) use of insulin: Secondary | ICD-10-CM | POA: Diagnosis not present

## 2023-06-04 DIAGNOSIS — E1142 Type 2 diabetes mellitus with diabetic polyneuropathy: Secondary | ICD-10-CM | POA: Diagnosis not present

## 2023-06-04 DIAGNOSIS — E1122 Type 2 diabetes mellitus with diabetic chronic kidney disease: Secondary | ICD-10-CM

## 2023-06-04 DIAGNOSIS — N186 End stage renal disease: Secondary | ICD-10-CM

## 2023-06-04 DIAGNOSIS — E1165 Type 2 diabetes mellitus with hyperglycemia: Secondary | ICD-10-CM

## 2023-06-04 DIAGNOSIS — I959 Hypotension, unspecified: Secondary | ICD-10-CM | POA: Diagnosis not present

## 2023-06-04 DIAGNOSIS — Z992 Dependence on renal dialysis: Secondary | ICD-10-CM

## 2023-06-04 LAB — POCT GLYCOSYLATED HEMOGLOBIN (HGB A1C): Hemoglobin A1C: 9.4 % — AB (ref 4.0–5.6)

## 2023-06-04 NOTE — Patient Instructions (Addendum)
????? ?? ???? ??  ? ???(?? ???   70mg /dL ??)? ???? ?? ? ??? ?? ? 15? ??? ????(?? ??? 70mg /dL ??? ??)  ? 1??: ?? ??? ?? ? ???? 15g? ?????. ???? ??? ?????. ? ??? ?? 3-4? ?? ? ??? ?? ?? 3-4?? ?? ? ??? ? ?? 1?  ? 2??: 15? ?? ??? ?? ????? 3??: 15? ?? ?? ???? ?? ??? ??? ??? --> 1??? ???? ???? 15g? ? ?????.     STOP Prazosin due to low blood pressure      HOW TO TREAT LOW BLOOD SUGARS (Blood sugar LESS THAN 70 MG/DL) Please follow the RULE OF 15 for the treatment of hypoglycemia treatment (when your (blood sugars are less than 70 mg/dL)   STEP 1: Take 15 grams of carbohydrates when your blood sugar is low, which includes:  3-4 GLUCOSE TABS  OR 3-4 OZ OF JUICE OR REGULAR SODA OR ONE TUBE OF GLUCOSE GEL    STEP 2: RECHECK blood sugar in 15 MINUTES STEP 3: If your blood sugar is still low at the 15 minute recheck --> then, go back to STEP 1 and treat AGAIN with another 15 grams of carbohydrates.

## 2023-06-04 NOTE — Telephone Encounter (Signed)
Can you please follow-up on his pump orders that were signed earlier this year?   Thanks

## 2023-06-09 ENCOUNTER — Telehealth: Payer: Self-pay | Admitting: Nutrition

## 2023-06-09 ENCOUNTER — Other Ambulatory Visit (HOSPITAL_BASED_OUTPATIENT_CLINIC_OR_DEPARTMENT_OTHER): Payer: Self-pay

## 2023-06-09 ENCOUNTER — Other Ambulatory Visit: Payer: Self-pay | Admitting: Family Medicine

## 2023-06-09 MED ORDER — APIXABAN 5 MG PO TABS
5.0000 mg | ORAL_TABLET | Freq: Two times a day (BID) | ORAL | 2 refills | Status: DC
  Filled 2023-06-09: qty 180, 90d supply, fill #0
  Filled 2023-09-27: qty 180, 90d supply, fill #1
  Filled 2024-01-12: qty 180, 90d supply, fill #2

## 2023-06-09 NOTE — Telephone Encounter (Signed)
I asked friend if she knows if patient received his insulin pump.  She said he did not.  Kathi Simpers called and she said pump was sent back because [facility] was not able to get in touch with patient.   I gave Kathi Simpers her number and told her to have [facility] call Myriam Jacobson for verification of shipping address.  Myriam Jacobson was given Medtr[employer] number as well, if she does not hear from [facility] in one week.  She was instructed to call me when patient gets the pump to schedule appointment  for pump training.

## 2023-06-26 ENCOUNTER — Other Ambulatory Visit: Payer: Self-pay | Admitting: Cardiology

## 2023-06-28 ENCOUNTER — Other Ambulatory Visit (HOSPITAL_BASED_OUTPATIENT_CLINIC_OR_DEPARTMENT_OTHER): Payer: Self-pay

## 2023-06-28 MED ORDER — EZETIMIBE 10 MG PO TABS
10.0000 mg | ORAL_TABLET | Freq: Every day | ORAL | 3 refills | Status: DC
  Filled 2023-06-28: qty 90, 90d supply, fill #0
  Filled 2023-09-27: qty 90, 90d supply, fill #1
  Filled 2024-01-24: qty 90, 90d supply, fill #2
  Filled 2024-04-14: qty 90, 90d supply, fill #3

## 2023-07-07 ENCOUNTER — Other Ambulatory Visit (HOSPITAL_BASED_OUTPATIENT_CLINIC_OR_DEPARTMENT_OTHER): Payer: Self-pay

## 2023-07-07 MED ORDER — LIDOCAINE 5 % EX PTCH
1.0000 | MEDICATED_PATCH | Freq: Every day | CUTANEOUS | 1 refills | Status: DC
  Filled 2023-07-28: qty 30, 30d supply, fill #0
  Filled 2023-08-27: qty 30, 30d supply, fill #1

## 2023-07-07 MED ORDER — HYDROCODONE-ACETAMINOPHEN 10-325 MG PO TABS
ORAL_TABLET | ORAL | 0 refills | Status: DC
  Filled 2023-07-07: qty 60, 10d supply, fill #0

## 2023-07-28 ENCOUNTER — Other Ambulatory Visit (HOSPITAL_BASED_OUTPATIENT_CLINIC_OR_DEPARTMENT_OTHER): Payer: Self-pay

## 2023-07-28 IMAGING — MR MR HEAD W/O CM
9 of 12 series · 35 of 48 positions shown · non-contrast
Comparison: 08/27/2019 MRI brain, correlation is also made with CT
head 07/28/2021

CLINICAL DATA: Seizure, abnormal neuro exam

EXAM:
MRI HEAD WITHOUT CONTRAST
TECHNIQUE: Multiplanar, multiecho pulse sequences of the brain and surrounding
structures were obtained without intravenous contrast.

[Series 3: DWI · axial · 3.0mm · 1.09mm/px · z∈[-65,+78]mm · 9 of 98 slices shown (1 of 4)]
[im 1/98]
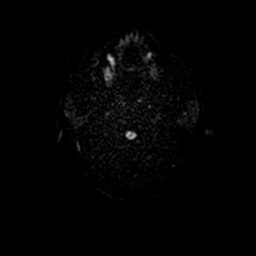
[im 13/98]
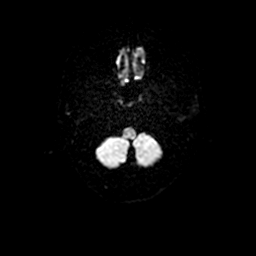
[im 25/98]
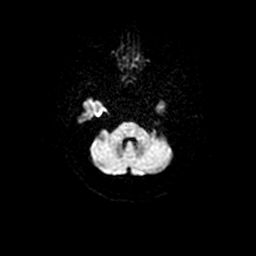
[im 37/98]
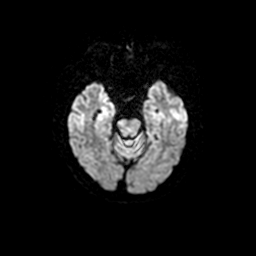
[im 49/98]
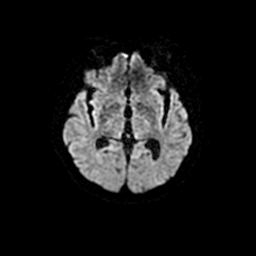
[im 61/98]
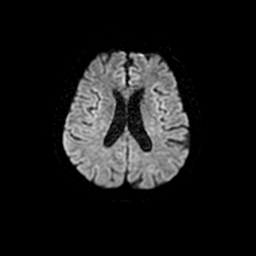
[im 73/98]
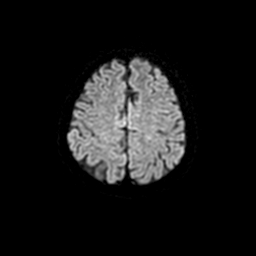
[im 85/98]
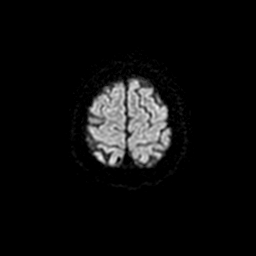
[im 98/98]
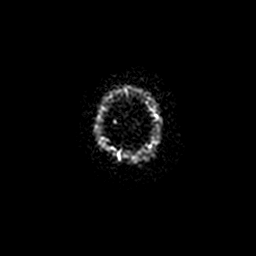

[Series 4: DWI · coronal · 5.0mm · 1.09mm/px · 7 of 74 slices shown (2 of 4)]
[im 1/74]
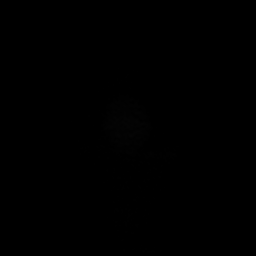
[im 13/74]
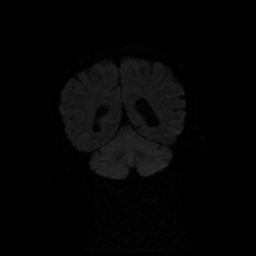
[im 25/74]
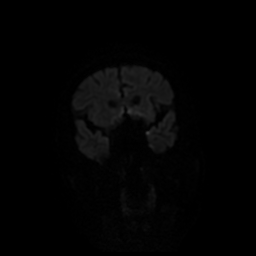
[im 37/74]
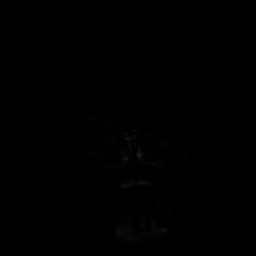
[im 49/74]
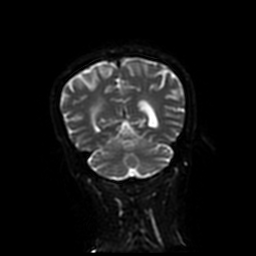
[im 61/74]
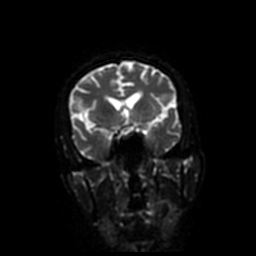
[im 74/74]
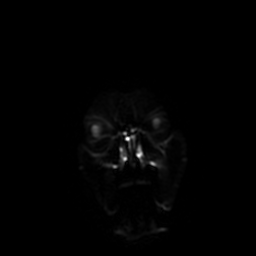

[Series 6: T2 · axial · 5.0mm · 0.43mm/px · z∈[-71,+71]mm · 2 of 25 slices shown (1 of 3)]
[im 1/25]
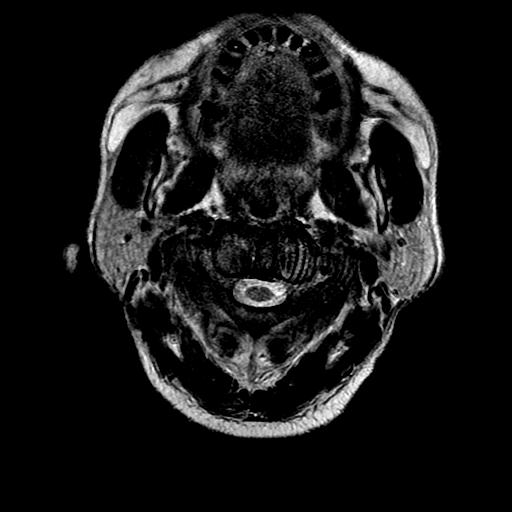
[im 25/25]
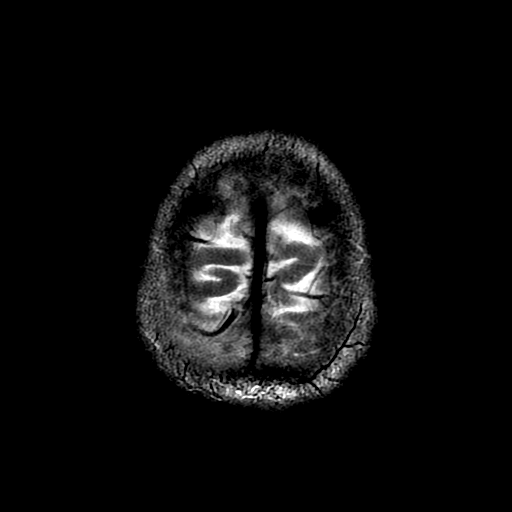

[Series 7: FLAIR · axial · 3.0mm · 0.43mm/px · z∈[-71,+71]mm · 2 of 25 slices shown (1 of 2)]
[im 1/25]
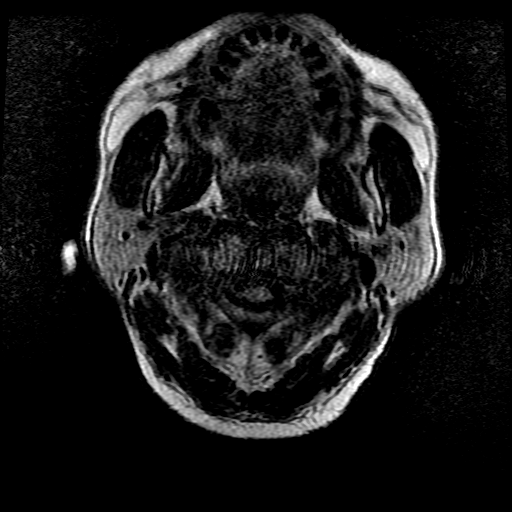
[im 25/25]
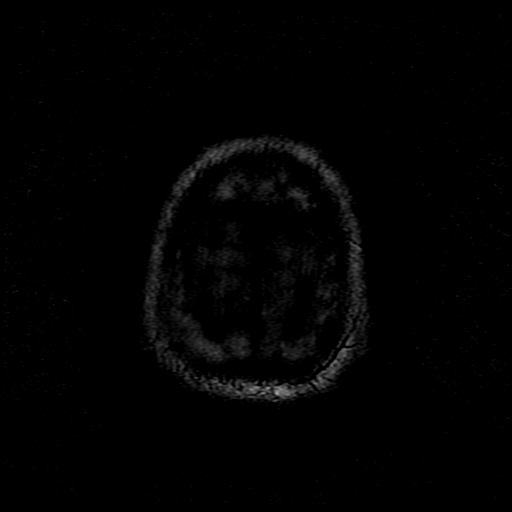

[Series 8: T2 · coronal · 3.0mm · 0.37mm/px · 3 of 29 slices shown (2 of 3)]
[im 1/29]
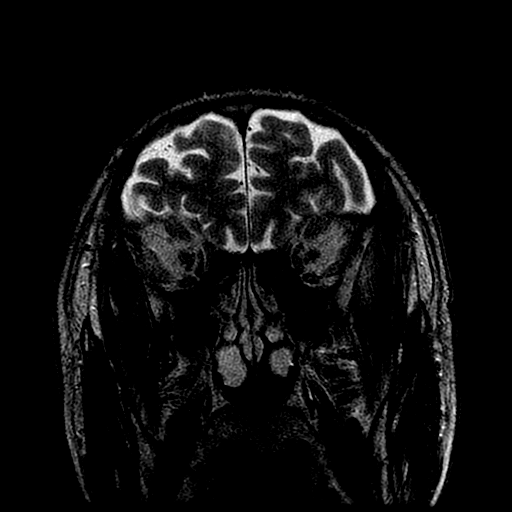
[im 15/29]
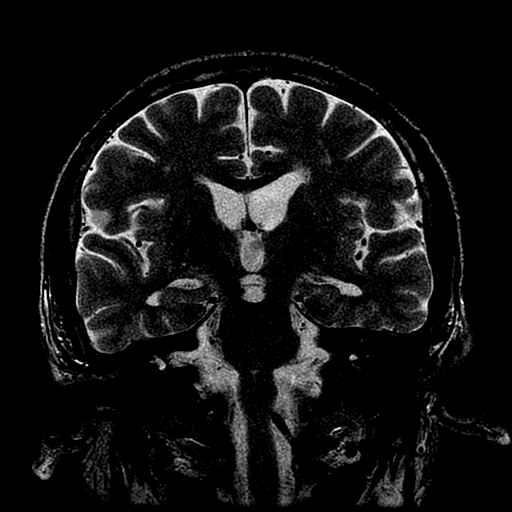
[im 29/29]
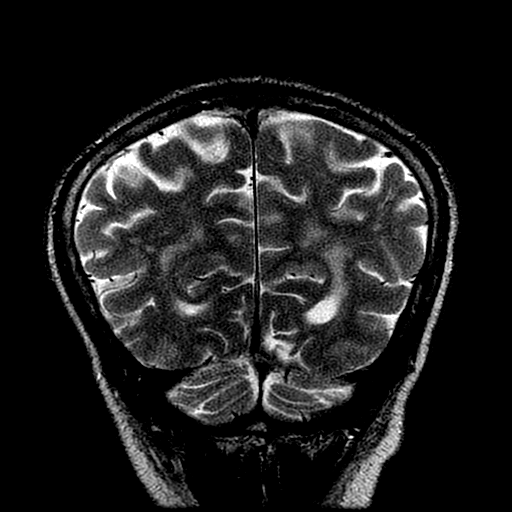

[Series 9: FLAIR · coronal · 3.0mm · 0.37mm/px · 3 of 29 slices shown (2 of 2)]
[im 1/29]
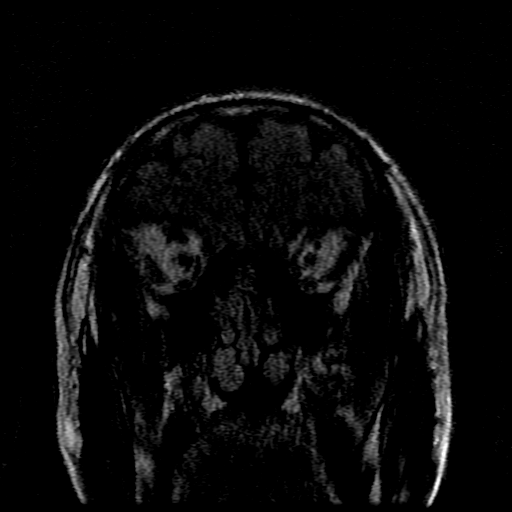
[im 15/29]
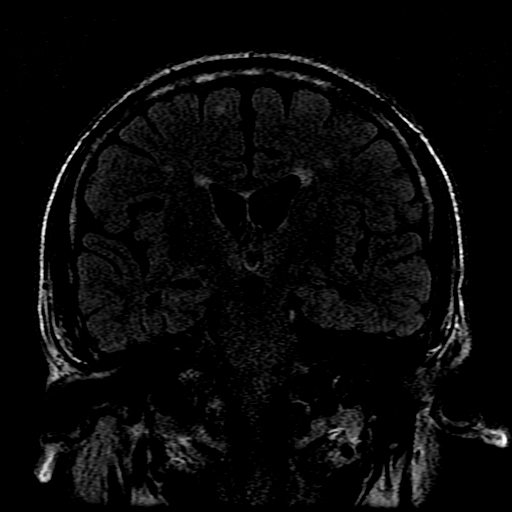
[im 29/29]
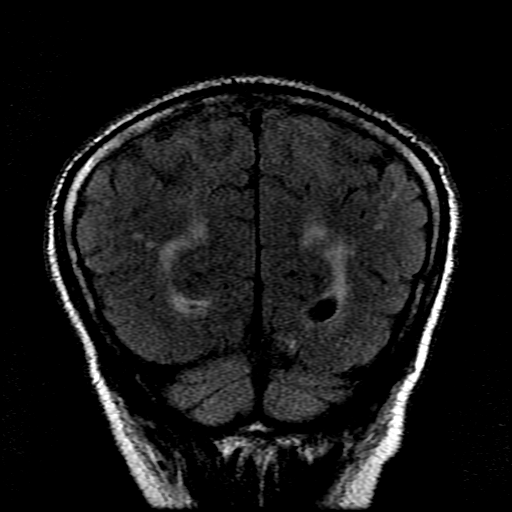

[Series 12: T2 · coronal · 5.0mm · 0.43mm/px · 2 of 24 slices shown (3 of 3)]
[im 1/24]
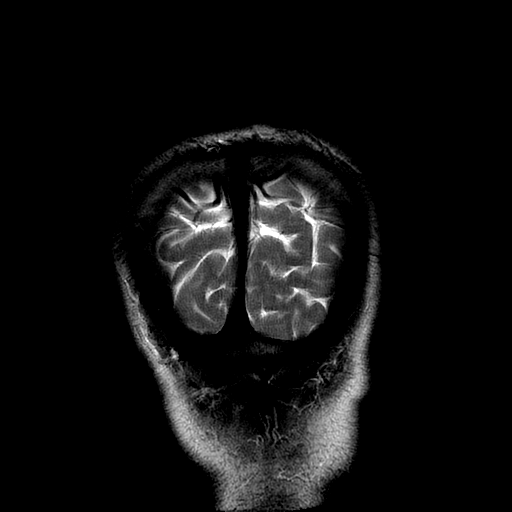
[im 24/24]
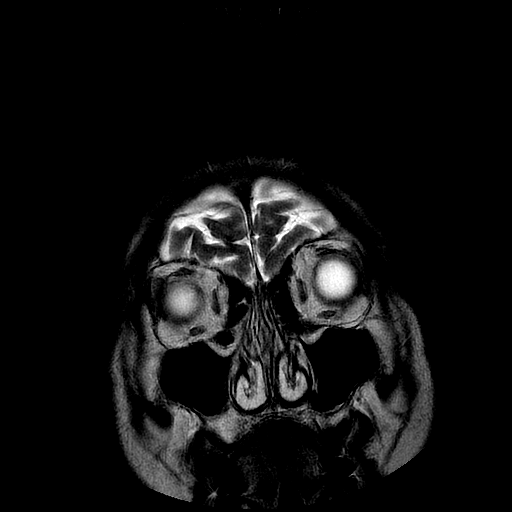

[Series 300: DWI · axial · 3.0mm · 1.09mm/px · z∈[-65,+78]mm · 4 of 49 slices shown (3 of 4)]
[im 1/49]
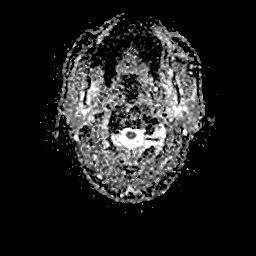
[im 17/49]
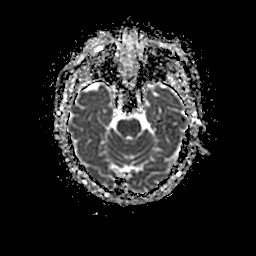
[im 33/49]
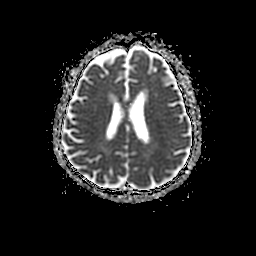
[im 49/49]
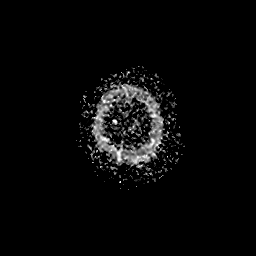

[Series 400: DWI · coronal · 5.0mm · 1.09mm/px · 3 of 37 slices shown (4 of 4)]
[im 1/37]
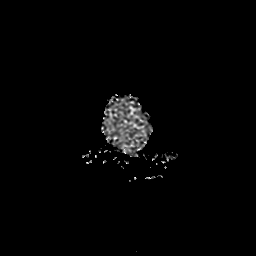
[im 19/37]
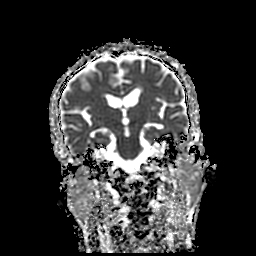
[im 37/37]
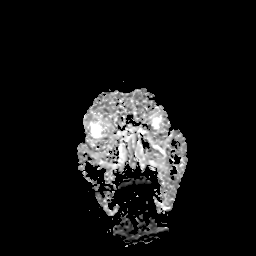

[35 of 48 positions shown; findings below may reference images not displayed]

FINDINGS: Brain: No restricted diffusion to suggest acute infarct. No acute
hemorrhage, mass, mass effect, or midline shift. Scattered T2
hyperintense signal in the periventricular white matter, likely the
sequela of chronic small vessel ischemic disease. Small remote
infarct in the right precentral gyrus and likely left anterior
frontal lobe. No hydrocephalus or extra-axial collection.

The hippocampi are symmetric in size and normal in signal. No
heterotopia or evidence of cortical dysgenesis.

Vascular: Normal flow voids.

Skull and upper cervical spine: Normal marrow signal.

Sinuses/Orbits: Negative.  Status post bilateral lens replacements.

Other: The mastoids are well aerated.
IMPRESSION: No acute intracranial process.  No seizure etiology identified.

## 2023-08-09 ENCOUNTER — Encounter (HOSPITAL_COMMUNITY): Payer: 59

## 2023-08-11 ENCOUNTER — Other Ambulatory Visit (HOSPITAL_BASED_OUTPATIENT_CLINIC_OR_DEPARTMENT_OTHER): Payer: Self-pay

## 2023-08-11 ENCOUNTER — Other Ambulatory Visit: Payer: Self-pay

## 2023-08-26 ENCOUNTER — Other Ambulatory Visit (HOSPITAL_BASED_OUTPATIENT_CLINIC_OR_DEPARTMENT_OTHER): Payer: Self-pay

## 2023-08-26 MED ORDER — LOKELMA 10 G PO PACK
10.0000 g | PACK | Freq: Every day | ORAL | 3 refills | Status: AC
Start: 2023-08-26 — End: ?
  Filled 2023-08-26: qty 90, 90d supply, fill #0
  Filled 2023-11-08: qty 30, 30d supply, fill #1

## 2023-08-27 ENCOUNTER — Other Ambulatory Visit (HOSPITAL_BASED_OUTPATIENT_CLINIC_OR_DEPARTMENT_OTHER): Payer: Self-pay

## 2023-09-01 ENCOUNTER — Encounter (HOSPITAL_COMMUNITY): Payer: 59

## 2023-09-01 ENCOUNTER — Other Ambulatory Visit (HOSPITAL_BASED_OUTPATIENT_CLINIC_OR_DEPARTMENT_OTHER): Payer: Self-pay

## 2023-09-06 NOTE — Progress Notes (Signed)
PCP: Dr. Cathie Hoops [facility]) Cardiology: Dr. Shirlee Latch  CC: HF follow up  67 y.o. 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  He was admitted in 9/17 with NSTEMI.  LHC showed 3VD and he had CABG x 3.  He had CKD stage IV when he was admitted and developed ESRD requiring HD while in the hospital.  He had transient atrial fibrillation post-CABG.  He developed cardiogenic shock requiring milrinone but was able to taper off.  Echo in 9/17 showed EF 35-40%.    Admitted 8/1 through 04/16/2017 with malignant hypertension due to medication noncompliance. Continues on HD.  HF M-W-F. Discharge weight 140 pounds.   Admitted 10/18 with chest pain and pulmonary edema. NSTEMI noted on admit. Echo showed preserved EF but + WMAs. Cath with 95% ostial SVG-OM1 stenosis with TIMI 2 flow in graft.  Patient had DES to SVG-OM1.   Admitted in 12/18 with atypical chest pain, suspect not cardiac.   NSTEMI in 7/19 with severe in-stent restenosis in SVG-OM1, had DES x 2 to native OM1.  Echo showed EF 50%.  Atrial fibrillation was noted this admission.   Cardiolite in 3/20 showed EF 54%, small fixed inferior defect with no ischemia.   He was admitted in 8/20 with severe episode of chest pain.  Hs-TnI was mildly elevated without trend.  Cath was done, showing no target for PCI and stable disease. Echo was done showing EF 50-55% with moderate LVH and normal RV.    He was admitted in 4/22 with CHF and chest pain, elevated HS-TnI to 1673.  Volume overloaded treated with daily HD in hospital.  He had RHC/LHC done after he had been dialyzed x 2, showing normal filling pressures and unchanged coronary anatomy.  Echo showed EF 55-60%, RV normal.   Echo 11/22 EF 60-65%, GIIDD, RV normal.    Admitted 11/22 after he became unresponsive at HD w/ ? Seizure like activity. K >7.5 (Lokelma had been previously stopped). Given Lokelma 10 g +  calcium gluconate + insulin. Also w/ fluid overload and given IV Lasix. Nephrology consulted and started urgent HD. HF consulted and recommended dry weight be lowered with NYHA III symptoms. Losartan stopped. 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 Discharged home with [facility] PT/OT, weight 136.8 lbs.  Seen by pulmonology for COPD (emphysema on CT chest) and breathing had improved after starting Stiolto.  Admitted 6/23 with PNA, requiring supplemental oxygen. Readmitted day after discharge with encephalopathy and volume overload, resolved with increased volume removal at dialysis.  Underwent lumbar decompression surgery 12/23.   Echo 7/24 showed EF 55% with moderate LVH, basal to mid inferior and inferoseptal hypokinesis, normal RV, IVC normal. Wall motion abnormalities seen on this echo have been seen in the past.   Today he returns for HF follow up with his wife. Overall feeling fine. He has rare atypical chest pain. He has SOB walking outside in cold weather, no DOE. He uses a cane for balance. Denies palpitations, CP, dizziness, edema, or PND/Orthopnea. Appetite ok. No fever or chills. Weight at home 57 kgs. Taking all medications. BP at home 127/60-70s. BP dropping after dialysis, has not been holding BP-active meds before dialysis sessions. Saw [facility] renal transplant team a couple weeks ago, he is hopeful to receive a transplant.  ECG (personally reviewed): none ordered today.  Labs (12/18): LDL 64, HDL 31 Labs (7/19): LDL 36  Labs (  2/20): hgb 11.3, LDL 59, HDL 28 Labs (2/22): LDL 52, HDL 37 Labs (4/22): hgb 9.9 Labs (7/22): hgb 10.9 Labs (10/22): HDL 46, LDL 52 Labs (11/22): K 4.7, hgb 8.4 Labs (6/23): hgb 8.3 Labs (3/24): hgb A1C 7.8 Labs (4/24): hgb 12.9 Labs (7/24): LDL 61  PMH: 1. Type II diabetes 2. HTN 3. ESRD 4. CAD: NSTEMI 9/17 with CABG x 3 (LIMA-LAD, SVG-OM, SVG-RCA).   - NSTEMI 10/18 s/p DES to ostial SVG-OM - NSTEMI 7/19: severe in-stent restenosis in  SVG-ramus, chronic total occlusion of SVG-RCA.  DES x 2 to native ramus.  - Cardiolite (3/20): EF 54%, small fixed inferior defect with no ischemia.  - LHC (8/20): 90% pLAD, subtotal occlusion mLAD, LIMA-LAD patent, occluded SVG-high OM1, 40-50% in-stent restenosis in native OM1, 80% ostial small AV LCx (not target for intervention), occluded RCA, occluded SVG-RCA (chronic with left=>right collaterals).  No target for intervention.  - LHC (4/22): 90% pLAD, subtotal occlusion mLAD, LIMA-LAD patent, occluded SVG-high OM1, 50% in-stent restenosis in native OM1, 85% ostial small AV LCx (not target for intervention), occluded RCA, occluded SVG-RCA (chronic with left=>right collaterals).  No target for intervention.  5. Chronic systolic CHF: Ischemic cardiomyopathy.   - Echo (9/17) with EF 35-40%, grade II diastolic dysfunction.  - Echo (1/18) with EF 60-65%, grade II diastolic dysfunction. - Echo (72/53) with EF 55%, Akinesis of the basal-midinferolateral, inferior, and inferoseptal myocardiam. Akinesis of the basal anteroseptal myocardium.  - Echo (7/19): EF 50%, moderate LVH, mildly decreased RV systolic function.  - Echo (8/20): EF 50-55%, moderate LVH, normal RV - Echo (4/22): EF 55-60%, normal RV - Echo (11/22): EF 60-65%, RV normal. - Echo (7/24): EF 55% with moderate LVH, basal to mid inferior and inferoseptal hypokinesis, normal RV, IVC normal.  6. Atrial fibrillation: Paroxysmal.  Only noted post-op CABG.  7. ABIs normal in 12/17. 5/18 ABIs normal.  1/19 ABIs normal. 4/20 ABIs normal.  8. Carotid stenosis: Carotid dopplers (12/18) with < 50% right common carotid stenosis, 1-39% LICA stenosis.  - Carotid dopplers (1/20): < 50% BICA stenosis.  - Carotid dopplers (5/21): Mild bilateral disease.  9. Atrial fibrillation: Paroxysmal.  10. 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 Sciatica 12. Partial seizures 13. ABIs (12/22): normal 14. COPD: Prior smoker.  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 Suspected partial seizures  SH: Married, lives in [place], 1 son, prior smoker, no ETOH, speaks only [place].   FH: No premature CAD.  Review of systems complete and found to be negative unless listed in HPI.    Current Outpatient Medications  Medication Sig Dispense Refill   apixaban (ELIQUIS) 5 MG TABS tablet Take 1 tablet (5 mg total) by mouth 2 (two) times daily. 180 tablet 2   atorvastatin (LIPITOR) 80 MG tablet Take 1 tablet (80 mg total) by mouth daily. 90 tablet 3   calcium acetate (PHOSLO) 667 MG capsule Take 667 mg by mouth 3 (three) times daily with meals.      carvedilol (COREG) 12.5 MG tablet Take 1 tablet (12.5 mg total) by mouth 2 (two) times daily with a meal. 180 tablet 3   clobetasol (TEMOVATE) 0.05 % external solution Apply 1 Application topically 2 (two) times daily. 50 mL 0   Continuous Blood Gluc Sensor (FREESTYLE LIBRE 2 SENSOR) MISC 1 Device by Does not apply route every 14 (fourteen) days. 6 each 3   Continuous Blood Gluc Sensor (GUARDIAN SENSOR 3) MISC 1  Device by Does not apply route as directed. 9 each 3   Continuous Blood Gluc Transmit (GUARDIAN LINK 3 TRANSMITTER) MISC 1 Device by Does not apply route as directed. 1 each 3   DULoxetine (CYMBALTA) 30 MG capsule Take 1 capsule (30 mg total) by mouth daily. 90 capsule 3   ezetimibe (ZETIA) 10 MG tablet Take 1 tablet (10 mg total) by mouth daily. 90 tablet 3   glucose blood (ONETOUCH VERIO) test strip 1 each by Other route in the morning, at noon, in the evening, and at bedtime. Use as instructed 400 each 2   hydrALAZINE (APRESOLINE) 100 MG tablet Take 1 tablet (100 mg total) by mouth 3 (three) times daily. DO NOT TAKE AM AND NOON DOSES ON HD DAYS. 270 tablet 2   HYDROcodone-acetaminophen (NORCO) 10-325 MG tablet Take 1 tablet by mouth every 4 to 6 hours as needed for pain. 60 tablet 0   Insulin Lispro-aabc (LYUMJEV) 100 UNIT/ML SOLN Max Daily 30 units per insulin pump 30  mL 3   Insulin Pen Needle (PEN NEEDLES) 32G X 6 MM MISC Use as directed 3 times daily 300 each 1   isosorbide mononitrate (IMDUR) 60 MG 24 hr tablet Take 1&1/2 tablets (90 mg total) by mouth daily. Hold on days of dialysis on Mon, Wed, Fri. 90 tablet 5   lidocaine (LIDODERM) 5 % Place 1 patch onto the skin  as directed. May wear upto12 hours 30 patch 1   megestrol (MEGACE ES) 625 MG/5ML suspension Take 5 mLs (625 mg total) by mouth daily. 150 mL 1   Methoxy PEG-Epoetin Beta (MIRCERA IJ) Inject as directed.     pantoprazole (PROTONIX) 40 MG tablet Take 1 tablet (40 mg total) by mouth 2 (two) times daily. 180 tablet 1   prednisoLONE acetate (PRED FORTE) 1 % ophthalmic suspension Place 1 drop into the left eye 4 (four) times daily.     sodium zirconium cyclosilicate (LOKELMA) 10 g PACK packet Take 1 packet dissolved in water as directed as directed Take on nondialysis days 3 times a week. Dissolve in 2-3 ounces of water 13 packet 11   sodium zirconium cyclosilicate (LOKELMA) 10 g PACK packet Take 1 packet (10 grams total) dissolved in water by mouth daily. 90 packet 3   Tiotropium Bromide-Olodaterol (STIOLTO RESPIMAT) 2.5-2.5 MCG/ACT AERS Inhale 2 puffs by mouth into the lungs daily. 4 g 11   triamcinolone cream (KENALOG) 0.1 % Apply 1 Application topically 2 (two) times daily. 30 g 0   iron sucrose in sodium chloride 0.9 % 100 mL Iron Sucrose (Venofer) (Patient not taking: Reported on 09/10/2023)     No current facility-administered medications for this encounter.   BP (!) 106/44   Pulse 66   Wt 59.5 kg (131 lb 3.2 oz)   SpO2 99%   BMI 21.83 kg/m   Wt Readings from Last 3 Encounters:  09/10/23 59.5 kg (131 lb 3.2 oz)  06/04/23 59.3 kg (130 lb 12.8 oz)  05/26/23 55.4 kg (122 lb 2 oz)   Physical Exam: General:  NAD. No resp difficulty, walked into clinic with cane. HEENT: Normal Neck: Supple. No JVD. Carotids 2+ bilat; no bruits. No lymphadenopathy or thryomegaly appreciated. Cor: PMI  nondisplaced. Regular rate & rhythm. No rubs, gallops or murmurs. Lungs: Clear Abdomen: Soft, nontender, nondistended. No hepatosplenomegaly. No bruits or masses. Good bowel sounds. Extremities: No cyanosis, clubbing, rash, edema Neuro: Alert & oriented x 3, cranial nerves grossly intact. Moves all 4 extremities w/o difficulty.  Affect pleasant.  Assessment/Plan: 1. CAD: s/p CABG.  NSTEMI 10/18, cath with 95% ostial SVG-high OM1 stenosis with TIMI 2 flow in graft, s/p DES to SVG-OM1 07/07/17. NSTEMI 7/19 with in-stent restenosis in the SVG-ramus, had DES x 2 to native high OM1.   He has had chronic atypical chest pain episodes, Cardiolite in 3/20 showed no ischemia.  He had repeat cath in 8/20 showing occluded SVG-ramus (new) but continued patency of the native high OM1 s/p stenting.  No target for intervention.  LHC again in 4/22, no change from 8/20.  No intervention. No recent chest pain.    - He will continue Eliquis, so not on ASA.  - Continue Imdur 90 mg daily.  - Continue atorvastatin 80 mg daily + Zetia, LDL 61 (7/24) - No current indication for functional study/cath based on symptoms, but if transplant center feels that he needs evaluation prior to kidney transplant, we can arrange for either cath or cardiac PET.  2. Chronic diastolic CHF: EF low in past but improved after CABG. Echo 7/24 showed EF 55% with moderate LVH, basal to mid inferior and inferoseptal hypokinesis, normal RV, IVC normal.  Wall motion abnormalities on most recent echo have been seen in the past. NYHA I-II symptoms. Volume status managed by HD. - With low BP at home, decrease hydralazine to 75 mg tid. - Continue Imdur 90 mg daily.  - Continue Coreg 12.5 mg bid 3. Atrial fibrillation: Paroxysmal, regular on exam today. - Continue Coreg 12.5 mg bid - Continue Eliquis 5 mg bid. No bleeding issues. 4. HTN: BP dropping at HD. I asked him to hold his morning/afternoon doses of BP-active meds on dialysis days. I reviewed  meds with his wife. - Decrease hydralazine as above. - Continue Coreg 12.5 mg bid.  - Now off losartan with hyperkalemia. - Do not take BP-active meds pre-HD, can take after HD as long as BP not low.  5. ESRD: T/TH/Sat. Continue Lokelma per nephrology.  6. Hyperlipidemia: Continue atorvastatin and Zetia. 7. Type 2 diabetes: With diabetic neuropathy.  8. Low back pain: Chronic. S/p spinal decompression 12/23. 9. COPD: Long-time smoker but has quit. Emphysema on 2/23 CT chest.  - Symptomatically improved on Stiolto.  10. Anemia: Anemia of renal disease. This likely contributes to his chronic dyspnea.  - Per nephrology.  Follow up in 6 months with Dr. Kathreen Cornfield [facility] FNP-BC 09/10/2023

## 2023-09-10 ENCOUNTER — Ambulatory Visit (HOSPITAL_COMMUNITY)
Admission: RE | Admit: 2023-09-10 | Discharge: 2023-09-10 | Disposition: A | Discharge status: Home / Self Care | Payer: 59 | Source: Ambulatory Visit | Attending: Family Medicine | Admitting: Family Medicine

## 2023-09-10 ENCOUNTER — Encounter (HOSPITAL_COMMUNITY): Payer: Self-pay

## 2023-09-10 ENCOUNTER — Other Ambulatory Visit (HOSPITAL_BASED_OUTPATIENT_CLINIC_OR_DEPARTMENT_OTHER): Payer: Self-pay

## 2023-09-10 VITALS — BP 106/44 | HR 66 | Wt 131.2 lb

## 2023-09-10 DIAGNOSIS — I132 Hypertensive heart and chronic kidney disease with heart failure and with stage 5 chronic kidney disease, or end stage renal disease: Secondary | ICD-10-CM | POA: Diagnosis not present

## 2023-09-10 DIAGNOSIS — Z992 Dependence on renal dialysis: Secondary | ICD-10-CM | POA: Insufficient documentation

## 2023-09-10 DIAGNOSIS — Z951 Presence of aortocoronary bypass graft: Secondary | ICD-10-CM | POA: Insufficient documentation

## 2023-09-10 DIAGNOSIS — J439 Emphysema, unspecified: Secondary | ICD-10-CM

## 2023-09-10 DIAGNOSIS — Z955 Presence of coronary angioplasty implant and graft: Secondary | ICD-10-CM | POA: Insufficient documentation

## 2023-09-10 DIAGNOSIS — Z79899 Other long term (current) drug therapy: Secondary | ICD-10-CM | POA: Diagnosis not present

## 2023-09-10 DIAGNOSIS — Z87891 Personal history of nicotine dependence: Secondary | ICD-10-CM | POA: Diagnosis not present

## 2023-09-10 DIAGNOSIS — I252 Old myocardial infarction: Secondary | ICD-10-CM | POA: Diagnosis not present

## 2023-09-10 DIAGNOSIS — I2510 Atherosclerotic heart disease of native coronary artery without angina pectoris: Secondary | ICD-10-CM | POA: Diagnosis present

## 2023-09-10 DIAGNOSIS — I480 Paroxysmal atrial fibrillation: Secondary | ICD-10-CM | POA: Diagnosis not present

## 2023-09-10 DIAGNOSIS — E875 Hyperkalemia: Secondary | ICD-10-CM | POA: Insufficient documentation

## 2023-09-10 DIAGNOSIS — E785 Hyperlipidemia, unspecified: Secondary | ICD-10-CM | POA: Diagnosis not present

## 2023-09-10 DIAGNOSIS — D631 Anemia in chronic kidney disease: Secondary | ICD-10-CM

## 2023-09-10 DIAGNOSIS — I25810 Atherosclerosis of coronary artery bypass graft(s) without angina pectoris: Secondary | ICD-10-CM | POA: Insufficient documentation

## 2023-09-10 DIAGNOSIS — Z7901 Long term (current) use of anticoagulants: Secondary | ICD-10-CM | POA: Diagnosis not present

## 2023-09-10 DIAGNOSIS — M545 Low back pain, unspecified: Secondary | ICD-10-CM | POA: Diagnosis not present

## 2023-09-10 DIAGNOSIS — E1140 Type 2 diabetes mellitus with diabetic neuropathy, unspecified: Secondary | ICD-10-CM | POA: Insufficient documentation

## 2023-09-10 DIAGNOSIS — G8929 Other chronic pain: Secondary | ICD-10-CM | POA: Diagnosis not present

## 2023-09-10 DIAGNOSIS — N186 End stage renal disease: Secondary | ICD-10-CM

## 2023-09-10 DIAGNOSIS — I10 Essential (primary) hypertension: Secondary | ICD-10-CM

## 2023-09-10 DIAGNOSIS — I5032 Chronic diastolic (congestive) heart failure: Secondary | ICD-10-CM | POA: Diagnosis not present

## 2023-09-10 DIAGNOSIS — E118 Type 2 diabetes mellitus with unspecified complications: Secondary | ICD-10-CM

## 2023-09-10 DIAGNOSIS — E1122 Type 2 diabetes mellitus with diabetic chronic kidney disease: Secondary | ICD-10-CM | POA: Diagnosis not present

## 2023-09-10 MED ORDER — HYDRALAZINE HCL 50 MG PO TABS
75.0000 mg | ORAL_TABLET | Freq: Three times a day (TID) | ORAL | 3 refills | Status: DC
  Filled 2023-09-10: qty 270, 60d supply, fill #0

## 2023-09-10 NOTE — Patient Instructions (Addendum)
Thank you for coming in today  If you had labs drawn today, any labs that are abnormal the clinic will call you No news is good news  Medications: Decrease Hydralazine to 75 mg 1 tablet 3 times daily   Follow up appointments:  Your physician recommends that you schedule a follow-up appointment in:  6 months With Dr. Earlean Shawl will receive a reminder letter in the mail a few months in advance. If you don't receive a letter, please call our office to schedule the follow-up appointment.    Do the following things EVERYDAY: Weigh yourself in the morning before breakfast. Write it down and keep it in a log. Take your medicines as prescribed Eat low salt foods--Limit salt (sodium) to 2000 mg per day.  Stay as active as you can everyday Limit all fluids for the day to less than 2 liters   At the [facility], you and your health needs are our priority. As part of our continuing mission to provide you with exceptional heart care, we have created designated Provider Care Teams. These Care Teams include your primary Cardiologist (physician) and Advanced Practice Providers (APPs- Physician Assistants and Nurse Practitioners) who all work together to provide you with the care you need, when you need it.   You may see any of the following providers on your designated Care Team at your next follow up: Dr Arvilla Meres Dr Marca Ancona Dr. Marcos Eke, NP Robbie Lis, [place] [facility] [place], [place] Brynda Peon, NP Karle Plumber, PharmD   Please be sure to bring in all your medications bottles to every appointment.    Thank you for choosing [facility]  If you have any questions or concerns before your next appointment please send [place] a message through [place] or call our office at [phone].    TO LEAVE A MESSAGE FOR THE NURSE SELECT OPTION 2, PLEASE LEAVE A MESSAGE INCLUDING: YOUR NAME DATE OF  BIRTH CALL BACK NUMBER REASON FOR CALL**this is important as we prioritize the call backs  YOU WILL RECEIVE A CALL BACK THE SAME DAY AS LONG AS YOU CALL BEFORE 4:00 PM

## 2023-09-22 ENCOUNTER — Other Ambulatory Visit (HOSPITAL_BASED_OUTPATIENT_CLINIC_OR_DEPARTMENT_OTHER): Payer: Self-pay

## 2023-09-27 ENCOUNTER — Other Ambulatory Visit (HOSPITAL_BASED_OUTPATIENT_CLINIC_OR_DEPARTMENT_OTHER): Payer: Self-pay

## 2023-10-08 ENCOUNTER — Encounter: Payer: Self-pay | Admitting: Internal Medicine

## 2023-10-08 ENCOUNTER — Ambulatory Visit (INDEPENDENT_AMBULATORY_CARE_PROVIDER_SITE_OTHER): Payer: 59 | Admitting: Internal Medicine

## 2023-10-08 VITALS — BP 118/70 | HR 64 | Ht 65.0 in | Wt 122.0 lb

## 2023-10-08 DIAGNOSIS — E1142 Type 2 diabetes mellitus with diabetic polyneuropathy: Secondary | ICD-10-CM | POA: Diagnosis not present

## 2023-10-08 DIAGNOSIS — E1165 Type 2 diabetes mellitus with hyperglycemia: Secondary | ICD-10-CM

## 2023-10-08 DIAGNOSIS — E1122 Type 2 diabetes mellitus with diabetic chronic kidney disease: Secondary | ICD-10-CM | POA: Diagnosis not present

## 2023-10-08 DIAGNOSIS — Z992 Dependence on renal dialysis: Secondary | ICD-10-CM

## 2023-10-08 DIAGNOSIS — N186 End stage renal disease: Secondary | ICD-10-CM

## 2023-10-08 DIAGNOSIS — Z794 Long term (current) use of insulin: Secondary | ICD-10-CM

## 2023-10-08 LAB — POCT GLYCOSYLATED HEMOGLOBIN (HGB A1C): Hemoglobin A1C: 8.5 % — AB (ref 4.0–5.6)

## 2023-10-08 NOTE — Progress Notes (Signed)
Name: Kenneth [name]  MRN/ DOB: [id number], Feb 03, 1956   Age/ Sex: 68 y.o., male    PCP: Sharlene Dory, DO   Reason for Endocrinology Evaluation: Type 2 Diabetes Mellitus     Date of Initial Endocrinology Visit: 10/28/2021    PATIENT IDENTIFIER: Kenneth [name] is a 68 y.o. male with a past medical history of T2DM, CAD, CHF , HTN and ESRD on HD since 2017. The patient presented for initial endocrinology clinic visit on 10/28/2021 for consultative assistance with his diabetes management.    HPI: Kenneth [name] transitioned care from Dr. Shawnee Knapp . He is accompanied by his wife and interpreter    Diagnosed with DM years ago  > 20 yrs ago . Islet cell Ab negative 2021 Currently checking blood sugars multiple  x / day,  through freestyle libre   Hypoglycemia episodes : yes                Symptoms: shaking                  Frequency: 1/ week Hemoglobin A1c has ranged from 5.2% in 2022, peaking at 8.1% in 2021.   In terms of diet, the patient eats 2 meals a day. Snacks occasionally     Nephrology : Dr. Kathrene Bongo . Pt on HD  Monday, Wednesday and Friday  Pulmonary : Dr. Judeth Horn  10/2021 for dyspnea   He was trained on the Guardian sensor to our CDE 01/2023  SUBJECTIVE:   During the last visit (06/04/2023): A1c 9.4%       Today (10/08/23): Kenneth [name] is here for follow-up on diabetes management.  He is accompanied by his wife . Interpreter line was used .     He  checks his blood sugars multiple times daily, through freestyle libre. The patient has not had hypoglycemic episodes since the last clinic visit.   He continues to follow-up with [facility] kidney transplant team , [facility]  The patient did have gastroenteritis symptoms 2 days ago with nausea, vomiting, and diarrhea.  No symptoms in the past 2 days Denies fever   This patient with type 2 diabetes is treated with Medtronic  (insulin pump). During the visit the pump basal and bolus doses were reviewed including  carb/insulin rations and supplemental doses. The clinical list was updated. The glucose meter download was reviewed in detail to determine if the current pump settings are providing the best glycemic control without excessive hypoglycemia.  Pump and meter download:     Pump   Medtronic 630 G Settings   Insulin type   Lyumjev   Basal rate       0000 0.450  u/h    0600 0.475 u/h    1800 0.550u/h       I:C ratio       0000 1:10                  Sensitivity       0000  55      Goal       0000  110                Type & Model of Pump: medtronic  Insulin Type: Currently using lyumjev   PUMP STATISTICS: Average BG: 153 -/+ 66 BG Readings: 1.2/ day Average Daily Carbs (g): 19 Average Total Daily Insulin: 14.5 +/- 4.1 Average Daily Basal: 11.5(79 %) Average Daily Bolus: 3.0 (21%)     CONTINUOUS GLUCOSE MONITORING  RECORD INTERPRETATION    Dates of Recording: 1/10-1/23/2025  Sensor description:freestyle libre   Results statistics:   CGM use % of time 33  Average and SD 266/51.6  Time in range 37 %  % Time Above 180 19  % Time above 250 44  % Time Below target 0    Glycemic patterns summary: BG's trend down to optimal overnight and increased throughout the day  Hyperglycemic episodes  postprandial   Hypoglycemic episodes occurred N/A  Overnight periods: Optimal most of the time    HOME DIABETES REGIMEN: Lyumjev      Statin: yes  ACE-I/ARB: no      DIABETIC COMPLICATIONS: Microvascular complications:  ESRD on HD , neuropathy Denies: retinopathy  Last eye exam: Completed   Macrovascular complications:  CAD ( S/P CABG 2017) Denies: PVD, CVA   PAST HISTORY: Past Medical History:  Past Medical History:  Diagnosis Date   Arthritis    hands   CAD (coronary artery disease) 10/30/2016   NSTEMI 9/17 with CABG x 3 (LIMA-LAD, SVG-OM, SVG-RCA).   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   type 1 DM   Dyspnea    with exertion   ESRD on hemodialysis (HCC)    "MWF; [place]" (03/30/2018)   Gastroesophageal reflux disease 08/18/2016   Hyperlipidemia    Hypertension    Ischemic rest pain of lower extremity 02/06/2015   Keratoma 02/27/2015   Metatarsal deformity 02/27/2015   PAF (paroxysmal atrial fibrillation) (HCC)    Pronation deformity of both feet 02/27/2015   Past Surgical History:  Past Surgical History:  Procedure Laterality Date   A/V FISTULAGRAM Left 03/06/2021   Procedure: A/V FISTULAGRAM;  Surgeon: Cephus Shelling, MD;  [facility]: [facility];  Service: Cardiovascular;  Laterality: Left;   A/V FISTULAGRAM Left 06/02/2022   Procedure: A/V Fistulagram;  Surgeon: Nada Libman, MD;  [facility]: [facility];  Service: Cardiovascular;  Laterality: Left;   AV FISTULA PLACEMENT Left 06/22/2016   Procedure: ARTERIOVENOUS (AV) FISTULA CREATION LEFT UPPER ARM;  Surgeon: Larina Earthly, MD;  Location: [facility] OR;  Service: Vascular;  Laterality: Left;   BIOPSY  12/19/2020   Procedure: BIOPSY;  Surgeon: Meryl Dare, MD;  Location: [facility] ENDOSCOPY;  Service: Endoscopy;;   BIOPSY  12/15/2022   Procedure: BIOPSY;  Surgeon: Sherrilyn Rist, MD;  Location: [facility] ENDOSCOPY;  Service: Gastroenterology;;   CARDIAC CATHETERIZATION N/A 05/31/2016   Procedure: Left Heart Cath and Coronary Angiography;  Surgeon: Corky Crafts, MD;  Location: [facility] INVASIVE CV LAB;  Service: Cardiovascular;  Laterality: N/A;   CARDIAC CATHETERIZATION N/A 05/31/2016   Procedure: Right Heart Cath;  Surgeon: Corky Crafts, MD;  Location: [facility] INVASIVE CV LAB;  Service: Cardiovascular;  Laterality: N/A;   CARDIAC CATHETERIZATION N/A 05/31/2016   Procedure: IABP Insertion;  Surgeon: Corky Crafts, MD;  [facility];  Service: Cardiovascular;  Laterality: N/A;   COLONOSCOPY  WITH PROPOFOL N/A 12/20/2020   Procedure: COLONOSCOPY WITH PROPOFOL;  Surgeon: Napoleon Form, MD;  Location: [facility] ENDOSCOPY;  Service: Endoscopy;  Laterality: N/A;   COLONOSCOPY WITH PROPOFOL N/A 12/15/2022   Procedure: COLONOSCOPY WITH PROPOFOL;  Surgeon: Sherrilyn Rist, MD;  Location: [facility] ENDOSCOPY;  Service: Gastroenterology;  Laterality: N/A;   CORONARY ARTERY BYPASS  GRAFT N/A 06/05/2016   Procedure: CORONARY ARTERY BYPASS GRAFTING (CABG) x3 LIMA to LAD -SVG to OM -SVG to RCA;  Surgeon: Kerin Perna, MD;  Location: [facility] OR;  Service: Open Heart Surgery;  Laterality: N/A;   CORONARY STENT INTERVENTION N/A 07/07/2017   Procedure: CORONARY STENT INTERVENTION;  Surgeon: Iran Ouch, MD;  [facility];  Service: Cardiovascular;  Laterality: N/A;   CORONARY STENT INTERVENTION N/A 03/30/2018   Procedure: CORONARY STENT INTERVENTION;  Surgeon: Swaziland, Peter M, MD;  Location: [facility] INVASIVE CV LAB;  Service: Cardiovascular;  Laterality: N/A;   ESOPHAGOGASTRODUODENOSCOPY (EGD) WITH PROPOFOL N/A 12/19/2020   Procedure: ESOPHAGOGASTRODUODENOSCOPY (EGD) WITH PROPOFOL;  Surgeon: Meryl Dare, MD;  Location: [facility] ENDOSCOPY;  Service: Endoscopy;  Laterality: N/A;   ESOPHAGOGASTRODUODENOSCOPY (EGD) WITH PROPOFOL N/A 12/15/2022   Procedure: ESOPHAGOGASTRODUODENOSCOPY (EGD) WITH PROPOFOL;  Surgeon: Sherrilyn Rist, MD;  Location: [facility] ENDOSCOPY;  Service: Gastroenterology;  Laterality: N/A;   EXCHANGE OF A DIALYSIS CATHETER Right 04/16/2022   Procedure: EXCHANGE OF A DIALYSIS CATHETER;  Surgeon: Leonie Douglas, MD;  Location: [facility] OR;  Service: Vascular;  Laterality: Right;   GIVENS CAPSULE STUDY N/A 12/20/2020   Procedure: GIVENS CAPSULE STUDY;  Surgeon: Napoleon Form, MD;  Location[facility] ENDOS[facility];  Service: Endoscopy;  Laterality: N/A;   HEMOSTASIS CLIP PLACEMENT  12/15/2022   Procedure: HEMOSTASIS CLIP PLACEMENT;  Surgeon: Sherrilyn Rist, MD;  Location: [facility] ENDOSCOPY;  Service:  Gastroenterology;;   INSERTION OF DIALYSIS CATHETER N/A 06/05/2016   Procedure: INSERTION OF DIALYSIS/trialysis CATHETER;  Surgeon: Kerin Perna, MD;  Location: [facility] OR;  Service: Vascular;  Laterality: N/A;   INSERTION OF DIALYSIS CATHETER Right 06/13/2016   Procedure: INSERTION OF DIALYSIS CATHETER RIGHT INTERNAL JUGULAR;  Surgeon: Fransisco Hertz, MD;  Location: [facility] OR;  Service: Vascular;  Laterality: Right;   INSERTION OF DIALYSIS CATHETER Right 09/07/2019   Procedure: Insertion Of Dialysis Catheter;  Surgeon: Larina Earthly, MD;  Location: [facility] OR;  Service: Vascular;  Laterality: Right;   INSERTION OF DIALYSIS CATHETER N/A 04/09/2022   Procedure: INSERTION OF TUNNELED DIALYSIS CATHETER;  Surgeon: Leonie Douglas, MD;  Location: [facility] OR;  Service: Vascular;  Laterality: N/A;   INTRAOPERATIVE TRANSESOPHAGEAL ECHOCARDIOGRAM N/A 06/05/2016   Procedure: INTRAOPERATIVE TRANSESOPHAGEAL ECHOCARDIOGRAM;  Surgeon: Kerin Perna, MD;  Location: [facility] OR;  Service: Open Heart Surgery;  Laterality: N/A;   LEFT HEART CATH AND CORS/GRAFTS ANGIOGRAPHY N/A 11/03/2016   Procedure: Left Heart Cath and Cors/Grafts Angiography;  Surgeon: Lennette Bihari, MD;  [facility];  Service: Cardiovascular;  Laterality: N/A;   LEFT HEART CATH AND CORS/GRAFTS ANGIOGRAPHY N/A 07/07/2017   Procedure: LEFT HEART CATH AND CORS/GRAFTS ANGIOGRAPHY;  Surgeon: Laurey Morale, MD;  Location: [facility] INVASIVE CV LAB;  Service: Cardiovascular;  Laterality: N/A;   LEFT HEART CATH AND CORS/GRAFTS ANGIOGRAPHY N/A 03/30/2018   Procedure: LEFT HEART CATH AND CORS/GRAFTS ANGIOGRAPHY;  Surgeon: [place]azil[place], Peter M, MD;  Location: [facility] INVASIVE CV LAB;  Service: Cardiovascular;  Laterality: N/A;   LEFT HEART CATH AND CORS/GRAFTS ANGIOGRAPHY N/A 04/17/2019   Procedure: LEFT HEART CATH AND CORS/GRAFTS ANGIOGRAPHY;  Surgeon: Laurey Morale, MD;  Location: [facility] INVASIVE CV LAB;  Service: Cardiovascular;  Laterality: N/A;   LUMBAR  LAMINECTOMY/DECOMPRESSION MICRODISCECTOMY Bilateral 09/01/2022   Procedure: Laminectomy and Foraminotomy - bilateral - Lumbar three-Lumbar four;  Surgeon: Julio Sicks, MD;  Location: [facility] OR;  Service: Neurosurgery;  Laterality: Bilateral;   PERIPHERAL VASCULAR  BALLOON ANGIOPLASTY Left 06/02/2022   Procedure: PERIPHERAL VASCULAR BALLOON ANGIOPLASTY;  Surgeon: Nada Libman, MD;  [facility];  Service: Cardiovascular;  Laterality: Left;   PERIPHERAL VASCULAR INTERVENTION Left 03/06/2021   Procedure: PERIPHERAL VASCULAR INTERVENTION;  Surgeon: Cephus Shelling, MD;  [facility];  Service: Cardiovascular;  Laterality: Left;   POLYPECTOMY  12/20/2020   Procedure: POLYPECTOMY;  Surgeon: Napoleon Form, MD;  Location[facility] ENDOS[facility];  Service: Endoscopy;;   POLYPECTOMY  12/15/2022   Procedure: POLYPECTOMY;  Surgeon: Sherrilyn Rist, MD;  Location: [facility] ENDOSCOPY;  Service: Gastroenterology;;   REVISION OF ARTERIOVENOUS GORETEX GRAFT Left 07/27/2019   Procedure: PLICATION OF ANEURYSM OF ARTERIOVENOUS FISTULA  LEFT ARM;  Surgeon: Larina Earthly, MD;  Location: [facility] OR;  Service: Vascular;  Laterality: Left;   REVISON OF ARTERIOVENOUS FISTULA Left 09/07/2019   Procedure: REVISON OF LEFT UPPER ARM ARTERIOVENOUS FISTULA;  Surgeon: Larina Earthly, MD;  Location: [facility] OR;  Service: Vascular;  Laterality: Left;   REVISON OF ARTERIOVENOUS FISTULA Left 04/09/2022   Procedure: LEFT ARM FISTULA REVISION;  Surgeon: Leonie Douglas, MD;  Location: [facility] OR;  Service: Vascular;  Laterality: Left;  PERIPHERAL NERVE BLOCK   REVISON OF ARTERIOVENOUS FISTULA Left 04/16/2022   Procedure: REVISON OF ARTERIOVENOUS FISTULA;  Surgeon: Leonie Douglas, MD;  Location: [facility] OR;  Service: Vascular;  Laterality: Left;   RIGHT/LEFT HEART CATH AND CORONARY ANGIOGRAPHY N/A 01/03/2021   Procedure: RIGHT/LEFT HEART CATH AND CORONARY ANGIOGRAPHY;  Surgeon: Laurey Morale, MD;  Location: [facility] INVASIVE CV LAB;   Service: Cardiovascular;  Laterality: N/A;    Social History:  reports that he quit smoking about 10 years ago. His smoking use included cigarettes. He started smoking about 45 years ago. He has a 52.5 pack-year smoking history. He has never been exposed to tobacco smoke. He has never used smokeless tobacco. He reports that he does not drink alcohol and does not use drugs. Family History:  Family History  Problem Relation Age of Onset   CAD Father    Colon cancer Father    Diabetes Brother    Sleep apnea Son      HOME MEDICATIONS: Allergies as of 10/08/2023   No Known Allergies      Medication List        Accurate as of October 08, 2023 12:22 PM. If you have any questions, ask your nurse or doctor.          ARANESP (ALBUMIN FREE) IJ Darbepoetin Alfa (Aranesp)   atorvastatin 80 MG tablet Commonly known as: LIPITOR Take 1 tablet (80 mg total) by mouth daily.   calcium acetate 667 MG capsule Commonly known as: PHOSLO Take 667 mg by mouth 3 (three) times daily with meals.   carvedilol 12.5 MG tablet Commonly known as: COREG Take 1 tablet (12.5 mg total) by mouth 2 (two) times daily with a meal.   clobetasol 0.05 % external solution Commonly known as: TEMOVATE Apply 1 Application topically 2 (two) times daily.   DULoxetine 30 MG capsule Commonly known as: Cymbalta Take 1 capsule (30 mg total) by mouth daily.   Eliquis 5 MG Tabs tablet Generic drug: apixaban Take 1 tablet (5 mg total) by mouth 2 (two) times daily.   ezetimibe 10 MG tablet Commonly known as: ZETIA Take 1 tablet (10 mg total) by mouth daily.   FreeStyle Libre 2 Sensor Misc 1 Device by Does not apply route every 14 (fourteen) days.  Guardian Sensor 3 Misc 1 Device by Does not apply route as directed.   Guardian Link 3 Transmitter Misc 1 Device by Does not apply route as directed.   hydrALAZINE 50 MG tablet Commonly known as: APRESOLINE Take 1.5 tablets (75 mg total) by mouth 3 (three)  times daily.   HYDROcodone-acetaminophen 10-325 MG tablet Commonly known as: NORCO Take 1 tablet by mouth every 4 to 6 hours as needed for pain.   iron sucrose in sodium chloride 0.9 % 100 mL   isosorbide mononitrate 60 MG 24 hr tablet Commonly known as: IMDUR Take 1&1/2 tablets (90 mg total) by mouth daily. Hold on days of dialysis on Mon, Wed, Fri.   lidocaine 5 % Commonly known as: LIDODERM Place 1 patch onto the skin  as directed. May wear upto12 hours   Lokelma 10 g Pack packet Generic drug: sodium zirconium cyclosilicate Take 1 packet dissolved in water as directed as directed Take on nondialysis days 3 times a week. Dissolve in 2-3 ounces of water   Lokelma 10 g Pack packet Generic drug: sodium zirconium cyclosilicate Take 1 packet (10 grams total) dissolved in water by mouth daily.   Lyumjev 100 UNIT/ML Soln Generic drug: Insulin Lispro-aabc Max Daily 30 units per insulin pump   megestrol 625 MG/5ML suspension Commonly known as: MEGACE ES Take 5 mLs (625 mg total) by mouth daily.   MIRCERA IJ Inject as directed.   OneTouch Verio test strip Generic drug: glucose blood 1 each by Other route in the morning, at noon, in the evening, and at bedtime. Use as instructed   pantoprazole 40 MG tablet Commonly known as: PROTONIX Take 1 tablet (40 mg total) by mouth 2 (two) times daily.   prednisoLONE acetate 1 % ophthalmic suspension Commonly known as: PRED FORTE Place 1 drop into the left eye 4 (four) times daily.   Stiolto Respimat 2.5-2.5 MCG/ACT Aers Generic drug: Tiotropium Bromide-Olodaterol Inhale 2 puffs by mouth into the lungs daily.   TechLite Pen Needles 32G X 6 MM Misc Generic drug: Insulin Pen Needle Use as directed 3 times daily   triamcinolone cream 0.1 % Commonly known as: KENALOG Apply 1 Application topically 2 (two) times daily.         ALLERGIES: No Known Allergies   REVIEW OF SYSTEMS: A comprehensive ROS was conducted with the  patient and is negative except as per HPI     OBJECTIVE:   VITAL SIGNS: BP 118/70 (BP Location: Left Arm, Patient Position: Sitting, Cuff Size: Small)   Pulse 64   Ht 5\' 5"  (1.651 m)   Wt 122 lb (55.3 kg)   SpO2 98%   BMI 20.30 kg/m    PHYSICAL EXAM:  General: Pt appears well and is in NAD  Lungs: Clear with good BS bilat   Heart: RRR   Extremities:  Lower extremities - No pretibial edema.  Neuro: MS is good with appropriate affect, pt is alert and Ox3    DM foot exam: 11/10/2022 per podiatry      DATA REVIEWED:  Lab Results  Component Value Date   HGBA1C 8.5 (A) 10/08/2023   HGBA1C 9.4 (A) 06/04/2023   HGBA1C 7.8 (A) 11/30/2022    Latest Reference Range & Units 12/15/22 07:31  Sodium 135 - 145 mmol/L 138  Potassium 3.5 - 5.1 mmol/L 4.3  Chloride 98 - 111 mmol/L 97 (L)  Glucose 70 - 99 mg/dL 409 (H)  BUN 8 - 23 mg/dL 18  Creatinine 8.11 - 9.14  mg/dL 1.61 (H)  Calcium Ionized 1.15 - 1.40 mmol/L 1.03 (L)  (L): Data is abnormally low (H): Data is abnormally high  ASSESSMENT / PLAN / RECOMMENDATIONS:   1) Type 2 Diabetes Mellitus, poorly controlled , With Neuropathic, ESRD on HD and macrovascular  complications - Most recent A1c of 8.5 %. Goal A1c < 7.0 %.     - A1c skewed due to ESRD  -Barriers to diabetes self-care is language barrier -His A1c has trended down but continues to be above goal -Hypoglycemia overnight has resolved with adjusting his basal rate overnight, but unfortunately he continues with extreme hypoglycemia during the day as he does not enter any carbohydrates except for rarely.  Patient initially indicated that he does not enter carbohydrates on dialysis days, but it appears that he does not enter any carbohydrates other days as well -I again emphasized the importance of optimizing his glucose control to prevent blindness and amputation -He is not counting his carbohydrates, I will give him a set dose of 40 g of carbohydrates to enter each time  he eats a meal. -He has not been able to get the Guardian sensor due to language barriers, as the company attempted to contact him multiple times but they do not hear from the patient -I have also decreased his basal rate during the day due to tight BG's  - MEDICATIONS:     Pump   Medtronic 630 G Settings   Insulin type   Lyumjev   Basal rate       0000 0.450  u/h    0600 0.450 u/h    1800 0.550u/h       I:C ratio       0000 1:10    Enter 40 grams TIDQAC              Sensitivity       0000  55      Goal       0000  110         EDUCATION / INSTRUCTIONS: BG monitoring instructions: Patient is instructed to check his blood sugars 3 times a day, before meals . Call [facility] if: BG persistently < 70  I reviewed the Rule of 15 for the treatment of hypoglycemia in detail with the patient. Literature supplied.   2) Diabetic complications:  Eye: Does not have known diabetic retinopathy.  Neuro/ Feet: Does  have known diabetic peripheral neuropathy. Renal: Patient does  have known baseline CKD. He is  on an ACEI/ARB at present.    Follow-up in 3 months   I spent 25 minutes preparing to see the patient by review of recent labs, imaging and procedures, obtaining and reviewing separately obtained history, communicating with the patient/family or caregiver, ordering medications, tests or procedures, and documenting clinical information in the EHR including the differential Dx, treatment, and any further evaluation and other management   Signed electronically by: Lyndle Herrlich, MD  [facility] Endocrinology  [facility] [address] [place] [place], [place] [address] Phone: [phone] FAX: [phone]   CC: Sharlene Dory, DO [address] [place] [address] Phone: [phone]  Fax: [phone]    Return to Endocrinology clinic as below: Future Appointments  Date Time Provider Department  Center  10/25/2023  9:45 AM Sharlene Dory, DO [facility]  01/07/2024  1:20 PM Locklyn Henriquez, Konrad Dolores, MD [facility] None

## 2023-10-08 NOTE — Patient Instructions (Addendum)
Enter 40 grams of carbohydrates with each meal   ? ??? ???? 40g? ?????  ? ???(??? 70mg /dL ??)? ???? ?? ? ??? ?? ? 15? ??? ????(??? 70mg /dL ??? ??)  ? 1??: ??? ?? ? ???? 15g? ?????. ???? ??? ?????. ? ??? ?? 3-4? ?? ? ?? ?? ?? ?? 3-4?? ?? ? ??? ? ?? 1?  ? 2??: 15? ?? ??? ?? ????? 3??: 15? ?? ??? ?? ???? ??? ??? ??? --> 1??? ???? ???? 15g? ?? ?????.   HOW TO TREAT LOW BLOOD SUGARS (Blood sugar LESS THAN 70 MG/DL) Please follow the RULE OF 15 for the treatment of hypoglycemia treatment (when your (blood sugars are less than 70 mg/dL)   STEP 1: Take 15 grams of carbohydrates when your blood sugar is low, which includes:  3-4 GLUCOSE TABS  OR 3-4 OZ OF JUICE OR REGULAR SODA OR ONE TUBE OF GLUCOSE GEL    STEP 2: RECHECK blood sugar in 15 MINUTES STEP 3: If your blood sugar is still low at the 15 minute recheck --> then, go back to STEP 1 and treat AGAIN with another 15 grams of carbohydrates.

## 2023-10-25 ENCOUNTER — Telehealth: Payer: Self-pay

## 2023-10-25 ENCOUNTER — Encounter: Payer: Self-pay | Admitting: Family Medicine

## 2023-10-25 ENCOUNTER — Other Ambulatory Visit: Payer: Self-pay | Admitting: Family Medicine

## 2023-10-25 ENCOUNTER — Other Ambulatory Visit (HOSPITAL_BASED_OUTPATIENT_CLINIC_OR_DEPARTMENT_OTHER): Payer: Self-pay

## 2023-10-25 ENCOUNTER — Ambulatory Visit (INDEPENDENT_AMBULATORY_CARE_PROVIDER_SITE_OTHER): Payer: 59 | Admitting: Family Medicine

## 2023-10-25 VITALS — BP 128/72 | HR 64 | Temp 98.0°F | Resp 16 | Ht 65.0 in | Wt 132.0 lb

## 2023-10-25 DIAGNOSIS — I152 Hypertension secondary to endocrine disorders: Secondary | ICD-10-CM

## 2023-10-25 DIAGNOSIS — Z87891 Personal history of nicotine dependence: Secondary | ICD-10-CM

## 2023-10-25 DIAGNOSIS — E1159 Type 2 diabetes mellitus with other circulatory complications: Secondary | ICD-10-CM

## 2023-10-25 DIAGNOSIS — R748 Abnormal levels of other serum enzymes: Secondary | ICD-10-CM

## 2023-10-25 DIAGNOSIS — I480 Paroxysmal atrial fibrillation: Secondary | ICD-10-CM | POA: Diagnosis not present

## 2023-10-25 DIAGNOSIS — F339 Major depressive disorder, recurrent, unspecified: Secondary | ICD-10-CM | POA: Diagnosis not present

## 2023-10-25 LAB — COMPREHENSIVE METABOLIC PANEL
ALT: 14 U/L (ref 0–53)
AST: 17 U/L (ref 0–37)
Albumin: 4 g/dL (ref 3.5–5.2)
Alkaline Phosphatase: 163 U/L — ABNORMAL HIGH (ref 39–117)
BUN: 38 mg/dL — ABNORMAL HIGH (ref 6–23)
CO2: 31 meq/L (ref 19–32)
Calcium: 8.5 mg/dL (ref 8.4–10.5)
Chloride: 92 meq/L — ABNORMAL LOW (ref 96–112)
Creatinine, Ser: 6.81 mg/dL (ref 0.40–1.50)
GFR: 7.86 mL/min — CL (ref >60.00)
Glucose, Bld: 142 mg/dL — ABNORMAL HIGH (ref 70–99)
Potassium: 4.7 meq/L (ref 3.5–5.1)
Sodium: 135 meq/L (ref 135–145)
Total Bilirubin: 0.7 mg/dL (ref 0.2–1.2)
Total Protein: 6.5 g/dL (ref 6.0–8.3)

## 2023-10-25 LAB — LIPID PANEL
Cholesterol: 97 mg/dL (ref 0–200)
HDL: 36.6 mg/dL — ABNORMAL LOW (ref >39.00)
LDL Cholesterol: 45 mg/dL (ref 0–99)
NonHDL: 60.43
Total CHOL/HDL Ratio: 3
Triglycerides: 79 mg/dL (ref 0.0–149.0)
VLDL: 15.8 mg/dL (ref 0.0–40.0)

## 2023-10-25 NOTE — Patient Instructions (Signed)
Give us 2-3 business days to get the results of your labs back.   Keep the diet clean and stay active.  Aim to do some physical exertion for 150 minutes per week. This is typically divided into 5 days per week, 30 minutes per day. The activity should be enough to get your heart rate up. Anything is better than nothing if you have time constraints.  Let us know if you need anything.  

## 2023-10-25 NOTE — Progress Notes (Signed)
 Chief Complaint  Patient presents with   Follow-up    Follow up    Subjective Kenneth [name] is a 68 y.o. male who presents for hypertension follow up. He does monitor home blood pressures. Blood pressures ranging from 130's/80's on average. He is compliant with medications- Coreg  12.5  mg bid, hydralazine  75 mg qhs. Patient has these side effects of medication: none He is adhering to a healthy diet overall. Current exercise: walking No CP or SOB.   History of depression on Cymbalta  30 mg daily.  Helps a little bit with his back pain as well.  Reports compliance, no adverse effects.  No homicidal or suicidal ideation.  No self-medication.  Is not working with the counseling team.  AF: Hx of A fib on Eliquis  5 mg bid. Compliant, no AE's. No palpitations.  He is also taking Coreg .   Past Medical History:  Diagnosis Date   Arthritis    hands   CAD (coronary artery disease) 10/30/2016   NSTEMI 9/17 with CABG x 3 (LIMA-LAD, SVG-OM, SVG-RCA).   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   type 1 DM   Dyspnea    with exertion   ESRD on hemodialysis (HCC)    "MWF; [place]" (03/30/2018)   Gastroesophageal reflux disease 08/18/2016   Hyperlipidemia    Hypertension    Ischemic rest pain of lower extremity 02/06/2015   Keratoma 02/27/2015   Metatarsal deformity 02/27/2015   PAF (paroxysmal atrial fibrillation) (HCC)    Pronation deformity of both feet 02/27/2015    Exam BP 128/72   Pulse 64   Temp 98 F (36.7 C) (Oral)   Resp 16   Ht 5\' 5"  (1.651 m)   Wt 132 lb (59.9 kg)   SpO2 98%   BMI 21.97 kg/m  General:  well developed, well nourished, in no apparent distress Heart: RRR, no bruits, no LE edema Lungs: clear to auscultation, no accessory muscle use Psych: well oriented with normal range of affect and appropriate  judgment/insight  Hypertension associated with diabetes (HCC) - Plan: Comprehensive metabolic panel, Lipid panel  Depression, recurrent (HCC)  AF (paroxysmal atrial fibrillation) (HCC)  Stopped smoking with greater than 20 pack year history - Plan: CT CHEST LUNG CANCER SCREENING LOW DOSE WO CONTRAST  Chronic, stable.  Continue Coreg  12.5 mg twice daily.  Counseled on diet and exercise. Chronic, stable.  Continue Cymbalta  30 mg daily. Chronic, stable.  Continue Eliquis  5 mg twice daily, Coreg  as above. CT chest. F/u in 6 mo. The patient voiced understanding and agreement to the plan.  Shellie Dials [place], DO 10/25/23  9:57 AM

## 2023-10-25 NOTE — Telephone Encounter (Signed)
 CRITICAL VALUE STICKER  CRITICAL VALUE:Creatine and GFR  RECEIVER (on-site recipient of call):Laszlo Ellerby CMA  DATE & TIME NOTIFIED: 10/25/2023 / 1:50 PM  MESSENGER (representative from lab):Hope  MD NOTIFIED:Dr.Wendling     TIME OF NOTIFICATION: PCP  RESPONSE:Provider was made aware and sent pt mychart messsge.

## 2023-10-28 ENCOUNTER — Other Ambulatory Visit (HOSPITAL_BASED_OUTPATIENT_CLINIC_OR_DEPARTMENT_OTHER): Payer: Self-pay

## 2023-10-28 MED ORDER — LOKELMA 10 G PO PACK
10.0000 g | PACK | Freq: Every day | ORAL | 3 refills | Status: AC
  Filled 2023-10-28: qty 30, 30d supply, fill #0
  Filled 2023-11-02: qty 90, 90d supply, fill #0

## 2023-11-02 ENCOUNTER — Other Ambulatory Visit (HOSPITAL_BASED_OUTPATIENT_CLINIC_OR_DEPARTMENT_OTHER): Payer: Self-pay

## 2023-11-08 ENCOUNTER — Ambulatory Visit (HOSPITAL_BASED_OUTPATIENT_CLINIC_OR_DEPARTMENT_OTHER)
Admission: RE | Admit: 2023-11-08 | Discharge: 2023-11-08 | Disposition: A | Discharge status: Home / Self Care | Payer: 59 | Source: Ambulatory Visit | Attending: Family Medicine | Admitting: Family Medicine

## 2023-11-08 ENCOUNTER — Other Ambulatory Visit (INDEPENDENT_AMBULATORY_CARE_PROVIDER_SITE_OTHER): Payer: 59

## 2023-11-08 ENCOUNTER — Other Ambulatory Visit (HOSPITAL_COMMUNITY): Payer: Self-pay | Admitting: Cardiology

## 2023-11-08 ENCOUNTER — Other Ambulatory Visit: Payer: Self-pay

## 2023-11-08 ENCOUNTER — Other Ambulatory Visit (HOSPITAL_BASED_OUTPATIENT_CLINIC_OR_DEPARTMENT_OTHER): Payer: Self-pay

## 2023-11-08 DIAGNOSIS — R748 Abnormal levels of other serum enzymes: Secondary | ICD-10-CM | POA: Diagnosis not present

## 2023-11-08 DIAGNOSIS — Z87891 Personal history of nicotine dependence: Secondary | ICD-10-CM | POA: Insufficient documentation

## 2023-11-08 LAB — HEPATIC FUNCTION PANEL
ALT: 15 U/L (ref 0–53)
AST: 19 U/L (ref 0–37)
Albumin: 4.4 g/dL (ref 3.5–5.2)
Alkaline Phosphatase: 144 U/L — ABNORMAL HIGH (ref 39–117)
Bilirubin, Direct: 0.2 mg/dL (ref 0.0–0.3)
Total Bilirubin: 0.8 mg/dL (ref 0.2–1.2)
Total Protein: 7.4 g/dL (ref 6.0–8.3)

## 2023-11-08 LAB — GAMMA GT: GGT: 41 U/L (ref 7–51)

## 2023-11-08 MED ORDER — ATORVASTATIN CALCIUM 80 MG PO TABS
80.0000 mg | ORAL_TABLET | Freq: Every day | ORAL | 3 refills | Status: AC
  Filled 2023-11-08: qty 90, 90d supply, fill #0
  Filled 2024-01-12 – 2024-02-16 (×2): qty 90, 90d supply, fill #1
  Filled 2024-05-22: qty 90, 90d supply, fill #2
  Filled 2024-08-23: qty 90, 90d supply, fill #3

## 2023-11-11 LAB — ALKALINE PHOSPHATASE ISOENZYMES
Alkaline phosphatase (APISO): 147 U/L — ABNORMAL HIGH (ref 35–144)
Bone Isoenzymes: 70 % — ABNORMAL HIGH (ref 28–66)
Intestinal Isoenzymes: 0 % — ABNORMAL LOW (ref 1–24)
Liver Isoenzymes: 30 % (ref 25–69)
Macrohepatic isoenzymes: 0 % (ref <=0)
Placental isoenzymes: 0 % (ref <=0)

## 2023-11-12 ENCOUNTER — Encounter: Payer: Self-pay | Admitting: Family Medicine

## 2023-11-24 ENCOUNTER — Other Ambulatory Visit (HOSPITAL_BASED_OUTPATIENT_CLINIC_OR_DEPARTMENT_OTHER): Payer: Self-pay

## 2023-11-29 ENCOUNTER — Encounter: Payer: Self-pay | Admitting: Family Medicine

## 2023-12-20 ENCOUNTER — Other Ambulatory Visit: Payer: Self-pay | Admitting: Internal Medicine

## 2023-12-20 ENCOUNTER — Other Ambulatory Visit (HOSPITAL_BASED_OUTPATIENT_CLINIC_OR_DEPARTMENT_OTHER): Payer: Self-pay

## 2023-12-21 ENCOUNTER — Other Ambulatory Visit (HOSPITAL_BASED_OUTPATIENT_CLINIC_OR_DEPARTMENT_OTHER): Payer: Self-pay

## 2023-12-22 ENCOUNTER — Other Ambulatory Visit (HOSPITAL_BASED_OUTPATIENT_CLINIC_OR_DEPARTMENT_OTHER): Payer: Self-pay

## 2024-01-07 ENCOUNTER — Ambulatory Visit: Payer: 59 | Admitting: Internal Medicine

## 2024-01-12 ENCOUNTER — Ambulatory Visit (INDEPENDENT_AMBULATORY_CARE_PROVIDER_SITE_OTHER): Payer: 59 | Admitting: Internal Medicine

## 2024-01-12 ENCOUNTER — Encounter: Payer: Self-pay | Admitting: Internal Medicine

## 2024-01-12 ENCOUNTER — Other Ambulatory Visit (HOSPITAL_BASED_OUTPATIENT_CLINIC_OR_DEPARTMENT_OTHER): Payer: Self-pay

## 2024-01-12 VITALS — BP 118/70 | HR 107 | Ht 65.0 in | Wt 132.0 lb

## 2024-01-12 DIAGNOSIS — E1165 Type 2 diabetes mellitus with hyperglycemia: Secondary | ICD-10-CM

## 2024-01-12 DIAGNOSIS — N186 End stage renal disease: Secondary | ICD-10-CM

## 2024-01-12 DIAGNOSIS — Z992 Dependence on renal dialysis: Secondary | ICD-10-CM

## 2024-01-12 DIAGNOSIS — Z794 Long term (current) use of insulin: Secondary | ICD-10-CM | POA: Diagnosis not present

## 2024-01-12 DIAGNOSIS — E1142 Type 2 diabetes mellitus with diabetic polyneuropathy: Secondary | ICD-10-CM | POA: Diagnosis not present

## 2024-01-12 DIAGNOSIS — E1122 Type 2 diabetes mellitus with diabetic chronic kidney disease: Secondary | ICD-10-CM

## 2024-01-12 LAB — POCT GLYCOSYLATED HEMOGLOBIN (HGB A1C): Hemoglobin A1C: 8.7 % — AB (ref 4.0–5.6)

## 2024-01-12 NOTE — Progress Notes (Signed)
 Name: Kenneth [name]  MRN/ DOB: [id number], June 09, 1956   Age/ Sex: 68 y.o., male    PCP: Jobe Mulder, DO   Reason for Endocrinology Evaluation: Type 2 Diabetes Mellitus     Date of Initial Endocrinology Visit: 10/28/2021    PATIENT IDENTIFIER: [name] is a 68 y.o. male with a past medical history of T2DM, CAD, CHF , HTN and ESRD on HD since 2017. The patient presented for initial endocrinology clinic visit on 10/28/2021 for consultative assistance with his diabetes management.    HPI: Kenneth [name] transitioned care from Dr. Marcella Serge . He is accompanied by his wife and interpreter    Diagnosed with DM years ago  > 20 yrs ago . Islet cell Ab negative 2021 Currently checking blood sugars multiple  x / day,  through freestyle libre   Hypoglycemia episodes : yes                Symptoms: shaking                  Frequency: 1/ week Hemoglobin A1c has ranged from 5.2% in 2022, peaking at 8.1% in 2021.   In terms of diet, the patient eats 2 meals a day. Snacks occasionally     Nephrology : Dr. Ansel Kingdom . Pt on HD  Monday, Wednesday and Friday  Pulmonary : Dr. Marygrace Snellen  10/2021 for dyspnea   He was trained on the Guardian sensor to our CDE 01/2023  SUBJECTIVE:   During the last visit (10/08/2023): A1c 8.5%       Today (01/12/24): Kenneth [name] is here for follow-up on diabetes management.  He is accompanied by his wife .  Interpretor line used Young # [id number]    He  checks his blood sugars multiple times daily, through [employer]. The patient has had hypoglycemic episodes since the last clinic visit. This occurs randomly.   Denies nausea or vomiting recently  Denies constipation but has occasional loose stools  He follows with ophthalmology every 3 months for injections , slight improvement in the vision   This patient with type 2 diabetes is treated with Medtronic  (insulin  pump). During the visit the pump basal and bolus doses were reviewed including carb/insulin   rations and supplemental doses. The clinical list was updated. The glucose meter download was reviewed in detail to determine if the current pump settings are providing the best glycemic control without excessive hypoglycemia.  Pump and meter download:     Pump   Medtronic 630 G Settings   Insulin  type   Lyumjev    Basal rate       0000 0.450  u/h    0600 0.450 u/h    1800 0.550u/h       I:C ratio       0000 1:10    Enter 40 grams TIDQAC              Sensitivity       0000  55      Goal       0000  110               Type & Model of Pump: medtronic  Insulin  Type: Currently using lyumjev    PUMP STATISTICS: Average BG: 150 -/+ 63 BG Readings: 2.1/ day Average Daily Carbs (g): 23 Average Total Daily Insulin : 15.1 +/- 2.8 Average Daily Basal: 11.6 (77 %) Average Daily Bolus: 3.5 (23%)     CONTINUOUS GLUCOSE MONITORING RECORD INTERPRETATION  Dates of Recording: 4/17-4/30/2025  Sensor description:freestyle libre   Results statistics:   CGM use % of time 50  Average and SD 256/42.5  Time in range 29%  % Time Above 180 52  % Time above 250 1  % Time Below target 0    Glycemic patterns summary: BGs fluctuate throughout the day and night  Hyperglycemic episodes  postprandial   Hypoglycemic episodes occurred after bolus  Overnight periods: Variable but mostly high    HOME DIABETES REGIMEN: Lyumjev       Statin: yes  ACE-I/ARB: no      DIABETIC COMPLICATIONS: Microvascular complications:  ESRD on HD , neuropathy, left eye DR Last eye exam: Completed 01/05/2024  Macrovascular complications:  CAD ( S/P CABG 2017) Denies: PVD, CVA   PAST HISTORY: Past Medical History:  Past Medical History:  Diagnosis Date   Arthritis    hands   CAD (coronary artery disease) 10/30/2016   NSTEMI 9/17 with CABG x 3 (LIMA-LAD, SVG-OM, SVG-RCA).   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   type 1 DM   Dyspnea    with exertion   ESRD on hemodialysis (HCC)    "MWF; [place]" (03/30/2018)   Gastroesophageal reflux disease 08/18/2016   Hyperlipidemia    Hypertension    Ischemic rest pain of lower extremity 02/06/2015   Keratoma 02/27/2015   Metatarsal deformity 02/27/2015   PAF (paroxysmal atrial fibrillation) (HCC)    Pronation deformity of both feet 02/27/2015   Past Surgical History:  Past Surgical History:  Procedure Laterality Date   A/V FISTULAGRAM Left 03/06/2021   Procedure: A/V FISTULAGRAM;  Surgeon: Young Hensen, MD;  [facility]: [facility];  Service: Cardiovascular;  Laterality: Left;   A/V FISTULAGRAM Left 06/02/2022   Procedure: A/V Fistulagram;  Surgeon: Margherita Shell, MD;  Location: [facility];  Service: Cardiovascular;  Laterality: Left;   AV FISTULA PLACEMENT Left 06/22/2016   Procedure: ARTERIOVENOUS (AV) FISTULA CREATION LEFT UPPER ARM;  Surgeon: Mayo Speck, MD;  Location: [facility] OR;  Service: Vascular;  Laterality: Left;   BIOPSY  12/19/2020   Procedure: BIOPSY;  Surgeon: Asencion Blacksmith, MD;  Location: [facility] ENDOSCOPY;  Service: Endoscopy;;   BIOPSY  12/15/2022   Procedure: BIOPSY;  Surgeon: Albertina Hugger, MD;  Location: [facility] ENDOSCOPY;  Service: Gastroenterology;;   CARDIAC CATHETERIZATION N/A 05/31/2016   Procedure: Left Heart Cath and Coronary Angiography;  Surgeon: Lucendia Rusk, MD;  Location: [facility] INVASIVE CV LAB;  Service: Cardiovascular;  Laterality: N/A;   CARDIAC CATHETERIZATION N/A 05/31/2016   Procedure: Right Heart Cath;  Surgeon: Lucendia Rusk, MD;  Location: [facility] INVASIVE CV LAB;  Service: Cardiovascular;  Laterality: N/A;   CARDIAC CATHETERIZATION N/A 05/31/2016   Procedure: IABP Insertion;  Surgeon: Lucendia Rusk, MD;  [facility]: [facility];  Service: Cardiovascular;  Laterality: N/A;   COLONOSCOPY WITH PROPOFOL  N/A  12/20/2020   Procedure: COLONOSCOPY WITH PROPOFOL ;  Surgeon: Sergio Dandy, MD;  Location: [facility] ENDOSCOPY;  Service: Endoscopy;  Laterality: N/A;   COLONOSCOPY WITH PROPOFOL  N/A 12/15/2022   Procedure: COLONOSCOPY WITH PROPOFOL ;  Surgeon: Albertina Hugger, MD;  Location: [facility] ENDOSCOPY;  Service: Gastroenterology;  Laterality: N/A;   CORONARY ARTERY BYPASS GRAFT N/A 06/05/2016   Procedure: CORONARY ARTERY BYPASS GRAFTING (  CABG) x3 LIMA to LAD -SVG to OM -SVG to RCA;  Surgeon: Heriberto London, MD;  Location: [facility] OR;  Service: Open Heart Surgery;  Laterality: N/A;   CORONARY STENT INTERVENTION N/A 07/07/2017   Procedure: CORONARY STENT INTERVENTION;  Surgeon: Wenona Hamilton, MD;  [facility];  Service: Cardiovascular;  Laterality: N/A;   CORONARY STENT INTERVENTION N/A 03/30/2018   Procedure: CORONARY STENT INTERVENTION;  Surgeon: Swaziland, Peter M, MD;  Location: [facility] INVASIVE CV LAB;  Service: Cardiovascular;  Laterality: N/A;   ESOPHAGOGASTRODUODENOSCOPY (EGD) WITH PROPOFOL  N/A 12/19/2020   Procedure: ESOPHAGOGASTRODUODENOSCOPY (EGD) WITH PROPOFOL ;  Surgeon: Asencion Blacksmith, MD;  Location: [facility] ENDOSCOPY;  Service: Endoscopy;  Laterality: N/A;   ESOPHAGOGASTRODUODENOSCOPY (EGD) WITH PROPOFOL  N/A 12/15/2022   Procedure: ESOPHAGOGASTRODUODENOSCOPY (EGD) WITH PROPOFOL ;  Surgeon: Albertina Hugger, MD;  Location: [facility] ENDOSCOPY;  Service: Gastroenterology;  Laterality: N/A;   EXCHANGE OF A DIALYSIS CATHETER Right 04/16/2022   Procedure: EXCHANGE OF A DIALYSIS CATHETER;  Surgeon: Carlene Che, MD;  Location: [facility] OR;  Service: Vascular;  Laterality: Right;   GIVENS CAPSULE STUDY N/A 12/20/2020   Procedure: GIVENS CAPSULE STUDY;  Surgeon: Sergio Dandy, MD;  Location: [facility] ENDOSCOPY;  Service: Endoscopy;  Laterality: N/A;   HEMOSTASIS CLIP PLACEMENT  12/15/2022   Procedure: HEMOSTASIS CLIP PLACEMENT;  Surgeon: Albertina Hugger, MD;  Location: [facility]OSCOPY;  Service: Gastroenterology;;    INSERTION OF DIALYSIS CATHETER N/A 06/05/2016   Procedure: INSERTION OF DIALYSIS/trialysis CATHETER;  Surgeon: Heriberto London, MD;  Location: [facility] OR;  Service: Vascular;  Laterality: N/A;   INSERTION OF DIALYSIS CATHETER Right 06/13/2016   Procedure: INSERTION OF DIALYSIS CATHETER RIGHT INTERNAL JUGULAR;  Surgeon: Arvil Lauber, MD;  Location: [facility] OR;  Service: Vascular;  Laterality: Right;   INSERTION OF DIALYSIS CATHETER Right 09/07/2019   Procedure: Insertion Of Dialysis Catheter;  Surgeon: Mayo Speck, MD;  Location: [facility] OR;  Service: Vascular;  Laterality: Right;   INSERTION OF DIALYSIS CATHETER N/A 04/09/2022   Procedure: INSERTION OF TUNNELED DIALYSIS CATHETER;  Surgeon: Carlene Che, MD;  Location: [facility] OR;  Service: Vascular;  Laterality: N/A;   INTRAOPERATIVE TRANSESOPHAGEAL ECHOCARDIOGRAM N/A 06/05/2016   Procedure: INTRAOPERATIVE TRANSESOPHAGEAL ECHOCARDIOGRAM;  Surgeon: Heriberto London, MD;  Location: [facility] OR;  Service: Open Heart Surgery;  Laterality: N/A;   LEFT HEART CATH AND CORS/GRAFTS ANGIOGRAPHY N/A 11/03/2016   Procedure: Left Heart Cath and Cors/Grafts Angiography;  Surgeon: Millicent Ally, MD;  [facility]: [facility];  Service: Cardiovascular;  Laterality: N/A;   LEFT HEART CATH AND CORS/GRAFTS ANGIOGRAPHY N/A 07/07/2017   Procedure: LEFT HEART CATH AND CORS/GRAFTS ANGIOGRAPHY;  Surgeon: Darlis Eisenmenger, MD;  Location: [facility] INVASIVE CV LAB;  Service: Cardiovascular;  Laterality: N/A;   LEFT HEART CATH AND CORS/GRAFTS ANGIOGRAPHY N/A 03/30/2018   Procedure: LEFT HEART CATH AND CORS/GRAFTS ANGIOGRAPHY;  Surgeon: Swaziland, Peter M, MD;  Location: [facility] INVASIVE CV LAB;  Service: Cardiovascular;  Laterality: N/A;   LEFT HEART CATH AND CORS/GRAFTS ANGIOGRAPHY N/A 04/17/2019   Procedure: LEFT HEART CATH AND CORS/GRAFTS ANGIOGRAPHY;  Surgeon: Darlis Eisenmenger, MD;  Location: [facility] INVASIVE CV LAB;  Service: Cardiovascular;  Laterality: N/A;   LUMBAR LAMINECTOMY/DECOMPRESSION MICRODISCECTOMY  Bilateral 09/01/2022   Procedure: Laminectomy and Foraminotomy - bilateral - Lumbar three-Lumbar four;  Surgeon: Agustina Aldrich, MD;  Location: [facility] OR;  Service: Neurosurgery;  Laterality: Bilateral;   PERIPHERAL VASCULAR BALLOON ANGIOPLASTY Left 06/02/2022   Procedure: PERIPHERAL VASCULAR BALLOON  ANGIOPLASTY;  Surgeon: Margherita Shell, MD;  [facility]: [facility];  Service: Cardiovascular;  Laterality: Left;   PERIPHERAL VASCULAR INTERVENTION Left 03/06/2021   Procedure: PERIPHERAL VASCULAR INTERVENTION;  Surgeon: Young Hensen, MD;  [facility];  Service: Cardiovascular;  Laterality: Left;   POLYPECTOMY  12/20/2020   Procedure: POLYPECTOMY;  Surgeon: Sergio Dandy, MD;  Location: [facility] ENDOSCOPY;  Service: Endoscopy;;   POLYPECTOMY  12/15/2022   Procedure: POLYPECTOMY;  Surgeon: Albertina Hugger, MD;  Location: [facility]OS[facility];  Service: Gastroenterology;;   REVISION OF ARTERIOVENOUS GORETEX GRAFT Left 07/27/2019   Procedure: PLICATION OF ANEURYSM OF ARTERIOVENOUS FISTULA  LEFT ARM;  Surgeon: Mayo Speck, MD;  Location: [facility] OR;  Service: Vascular;  Laterality: Left;   REVISON OF ARTERIOVENOUS FISTULA Left 09/07/2019   Procedure: REVISON OF LEFT UPPER ARM ARTERIOVENOUS FISTULA;  Surgeon: Mayo Speck, MD;  Location: [facility] OR;  Service: Vascular;  Laterality: Left;   REVISON OF ARTERIOVENOUS FISTULA Left 04/09/2022   Procedure: LEFT ARM FISTULA REVISION;  Surgeon: Carlene Che, MD;  Location: [facility] OR;  Service: Vascular;  Laterality: Left;  PERIPHERAL NERVE BLOCK   REVISON OF ARTERIOVENOUS FISTULA Left 04/16/2022   Procedure: REVISON OF ARTERIOVENOUS FISTULA;  Surgeon: Carlene Che, MD;  Location: [facility] OR;  Service: Vascular;  Laterality: Left;   RIGHT/LEFT HEART CATH AND CORONARY ANGIOGRAPHY N/A 01/03/2021   Procedure: RIGHT/LEFT HEART CATH AND CORONARY ANGIOGRAPHY;  Surgeon: Darlis Eisenmenger, MD;  Location: [facility] INVASIVE CV LAB;  Service: Cardiovascular;  Laterality: N/A;     Social History:  reports that he quit smoking about 10 years ago. His smoking use included cigarettes. He started smoking about 45 years ago. He has a 52.5 pack-year smoking history. He has never been exposed to tobacco smoke. He has never used smokeless tobacco. He reports that he does not drink alcohol and does not use drugs. Family History:  Family History  Problem Relation Age of Onset   CAD Father    Colon cancer Father    Diabetes Brother    Sleep apnea Son      HOME MEDICATIONS: Allergies as of 01/12/2024   No Known Allergies      Medication List        Accurate as of January 12, 2024  1:54 PM. If you have any questions, ask your nurse or doctor.          ARANESP  (ALBUMIN  FREE) IJ Darbepoetin Alfa  (Aranesp )   atorvastatin  80 MG tablet Commonly known as: LIPITOR  Take 1 tablet (80 mg total) by mouth daily.   calcium  acetate 667 MG capsule Commonly known as: PHOSLO  Take 667 mg by mouth 3 (three) times daily with meals.   carvedilol  12.5 MG tablet Commonly known as: COREG  Take 1 tablet (12.5 mg total) by mouth 2 (two) times daily with a meal.   DULoxetine  30 MG capsule Commonly known as: Cymbalta  Take 1 capsule (30 mg total) by mouth daily.   Eliquis  5 MG Tabs tablet Generic drug: apixaban  Take 1 tablet (5 mg total) by mouth 2 (two) times daily.   ezetimibe  10 MG tablet Commonly known as: ZETIA  Take 1 tablet (10 mg total) by mouth daily.   FreeStyle Libre 2 Sensor Misc 1 Device by Does not apply route every 14 (fourteen) days.   Guardian Sensor 3 Misc 1 Device by Does not apply route as directed.   Guardian Link 3 Transmitter Misc 1 Device by Does not apply  route as directed.   HECTOROL  IV 2 mcg.   hydrALAZINE  50 MG tablet Commonly known as: APRESOLINE  Take 1.5 tablets (75 mg total) by mouth 3 (three) times daily.   HYDROcodone -acetaminophen  10-325 MG tablet Commonly known as: NORCO Take 1 tablet by mouth every 4 to 6 hours as needed for  pain.   iron sucrose in sodium chloride  0.9 % 100 mL   isosorbide  mononitrate 60 MG 24 hr tablet Commonly known as: IMDUR  Take 1&1/2 tablets (90 mg total) by mouth daily. Hold on days of dialysis on Mon, Wed, Fri.   lidocaine  5 % Commonly known as: LIDODERM  Place 1 patch onto the skin  as directed. May wear upto12 hours   Lokelma  10 g Pack packet Generic drug: sodium zirconium cyclosilicate  Take 1 packet dissolved in water as directed as directed Take on nondialysis days 3 times a week. Dissolve in 2-3 ounces of water   Lokelma  10 g Pack packet Generic drug: sodium zirconium cyclosilicate  Take 1 packet (10 grams total) dissolved in water by mouth daily.   Lokelma  10 g Pack packet Generic drug: sodium zirconium cyclosilicate  Take 1 packet (10 grams total) dissolved in water by mouth daily.   Lyumjev  100 UNIT/ML Soln Generic drug: Insulin  Lispro-aabc Max Daily 30 units per insulin  pump   MIRCERA IJ Inject as directed.   OneTouch Verio test strip Generic drug: glucose blood 1 each by Other route in the morning, at noon, in the evening, and at bedtime. Use as instructed   prednisoLONE  acetate 1 % ophthalmic suspension Commonly known as: PRED FORTE  Place 1 drop into the left eye 4 (four) times daily.   SENSIPAR  PO Take 60 mg by mouth.   Stiolto Respimat  2.5-2.5 MCG/ACT Aers Generic drug: Tiotropium Bromide-Olodaterol Inhale 2 puffs by mouth into the lungs daily.   TechLite Pen Needles 32G X 6 MM Misc Generic drug: Insulin  Pen Needle Use as directed 3 times daily   triamcinolone  cream 0.1 % Commonly known as: KENALOG  Apply 1 Application topically 2 (two) times daily.   Voltaren  1 % Gel Generic drug: diclofenac  Sodium Apply 4 g topically.   ZOFRAN  PO 4 mg.         ALLERGIES: No Known Allergies   REVIEW OF SYSTEMS: A comprehensive ROS was conducted with the patient and is negative except as per HPI     OBJECTIVE:   VITAL SIGNS: BP 118/70 (BP  Location: Left Arm, Patient Position: Sitting, Cuff Size: Normal)   Pulse (!) 107   SpO2 97%    PHYSICAL EXAM:  General: Pt appears well and is in NAD  Lungs: Clear with good BS bilat   Heart: RRR   Extremities:  Lower extremities - No pretibial edema.  Neuro: MS is good with appropriate affect, pt is alert and Ox3    DM foot exam: 01/12/2024  The skin of the feet is intact without sores or ulcerations. The pedal pulses are 1+ on right and 1+ on left. The sensation is decrease to a screening 5.07, 10 gram monofilament bilaterally      DATA REVIEWED:  Lab Results  Component Value Date   HGBA1C 8.7 (A) 01/12/2024   HGBA1C 8.5 (A) 10/08/2023   HGBA1C 9.4 (A) 06/04/2023    Latest Reference Range & Units 10/25/23 10:15  Sodium 135 - 145 mEq/L 135  Potassium 3.5 - 5.1 mEq/L 4.7  Chloride 96 - 112 mEq/L 92 (L)  CO2 19 - 32 mEq/L 31  Glucose 70 - 99 mg/dL 161 (  H)  BUN 6 - 23 mg/dL 38 (H)  Creatinine 9.52 - 1.50 mg/dL 8.41 (HH)  Calcium  8.4 - 10.5 mg/dL 8.5  Alkaline Phosphatase 39 - 117 U/L 163 (H)  Albumin  3.5 - 5.2 g/dL 4.0  AST 0 - 37 U/L 17  ALT 0 - 53 U/L 14  Total Protein 6.0 - 8.3 g/dL 6.5  Total Bilirubin 0.2 - 1.2 mg/dL 0.7  GFR >32.44 mL/min 7.86 (LL)    Latest Reference Range & Units 10/25/23 10:15  Total CHOL/HDL Ratio  3  Cholesterol 0 - 200 mg/dL 97  HDL Cholesterol >01.02 mg/dL 72.53 (L)  LDL (calc) 0 - 99 mg/dL 45  NonHDL  66.44  Triglycerides 0.0 - 149.0 mg/dL 03.4  VLDL 0.0 - 74.2 mg/dL 59.5    ASSESSMENT / PLAN / RECOMMENDATIONS:   1) Type 2 Diabetes Mellitus, poorly controlled , With Neuropathic, retinopathic ,ESRD on HD and macrovascular  complications - Most recent A1c of 8.7 %. Goal A1c < 7.0 %.     - A1c skewed due to ESRD  -Patient continues with hyperglycemia -Barriers to diabetes self-care is language barrier -He has not been able to get the Guardian sensor due to language barriers, as the company attempted to contact him multiple  times but they do not hear from the patient - Patient continues to not count carbohydrates or enter any carbohydrates with most meals, despite advising him against this multiple times on initial visit, per wife she tries to remind him but the patient states he does not care - I will adjust his insulin  to carb ratio as he has been noted with hypoglycemia post bolus when he does really bolus   - MEDICATIONS:     Pump   Medtronic 630 G Settings   Insulin  type   Lyumjev    Basal rate       0000 0.450  u/h    0600 0.450 u/h    1800 0.550u/h       I:C ratio       0000 1:12    Enter 40 grams TIDQAC              Sensitivity       0000  55      Goal       0000  110         EDUCATION / INSTRUCTIONS: BG monitoring instructions: Patient is instructed to check his blood sugars 3 times a day, before meals . Call [facility] if: BG persistently < 70  I reviewed the Rule of 15 for the treatment of hypoglycemia in detail with the patient. Literature supplied.   2) Diabetic complications:  Eye: Does not have known diabetic retinopathy.  Neuro/ Feet: Does  have known diabetic peripheral neuropathy. Renal: Patient does  have known baseline CKD. He is  on an ACEI/ARB at present.    Follow-up in 3 months    Signed electronically by: Natale Bail, MD  [facility]  [facility] [address] [place], [place] [address] Phone: [phone] FAX: [phone]   CC: Jobe Mulder, DO [address] [place] [address] Phone: [phone]  Fax: [phone]    Return to Endocrinology clinic as below: Future Appointments  Date Time Provider Department Center  06/02/2024  9:00 AM Gwenette Lennox, Shellie Dials, DO [id number]

## 2024-01-12 NOTE — Patient Instructions (Signed)
 HOW TO TREAT LOW BLOOD SUGARS (Blood sugar LESS THAN 70 MG/DL) Please follow the RULE OF 15 for the treatment of hypoglycemia treatment (when your (blood sugars are less than 70 mg/dL)   STEP 1: Take 15 grams of carbohydrates when your blood sugar is low, which includes:  3-4 GLUCOSE TABS  OR 3-4 OZ OF JUICE OR REGULAR SODA OR ONE TUBE OF GLUCOSE GEL    STEP 2: RECHECK blood sugar in 15 MINUTES STEP 3: If your blood sugar is still low at the 15 minute recheck --> then, go back to STEP 1 and treat AGAIN with another 15 grams of carbohydrates.

## 2024-01-13 ENCOUNTER — Encounter: Payer: Self-pay | Admitting: Internal Medicine

## 2024-01-24 ENCOUNTER — Other Ambulatory Visit (HOSPITAL_BASED_OUTPATIENT_CLINIC_OR_DEPARTMENT_OTHER): Payer: Self-pay

## 2024-01-31 ENCOUNTER — Other Ambulatory Visit (HOSPITAL_BASED_OUTPATIENT_CLINIC_OR_DEPARTMENT_OTHER): Payer: Self-pay

## 2024-01-31 ENCOUNTER — Other Ambulatory Visit: Payer: Self-pay | Admitting: Family Medicine

## 2024-01-31 MED ORDER — LIDOCAINE 5 % EX PTCH
1.0000 | MEDICATED_PATCH | Freq: Two times a day (BID) | CUTANEOUS | 1 refills | Status: DC | PRN
  Filled 2024-01-31: qty 30, 15d supply, fill #0
  Filled 2024-03-20: qty 30, 15d supply, fill #1

## 2024-02-16 ENCOUNTER — Other Ambulatory Visit (HOSPITAL_BASED_OUTPATIENT_CLINIC_OR_DEPARTMENT_OTHER): Payer: Self-pay

## 2024-03-20 ENCOUNTER — Other Ambulatory Visit (HOSPITAL_BASED_OUTPATIENT_CLINIC_OR_DEPARTMENT_OTHER): Payer: Self-pay

## 2024-03-23 ENCOUNTER — Other Ambulatory Visit (HOSPITAL_BASED_OUTPATIENT_CLINIC_OR_DEPARTMENT_OTHER): Payer: Self-pay

## 2024-03-23 MED ORDER — LOKELMA 10 G PO PACK
10.0000 g | PACK | Freq: Every day | ORAL | 3 refills | Status: AC
  Filled 2024-03-23: qty 30, 30d supply, fill #0
  Filled 2024-06-15: qty 30, 30d supply, fill #1

## 2024-03-29 LAB — HM DIABETES EYE EXAM

## 2024-04-12 ENCOUNTER — Encounter: Payer: Self-pay | Admitting: Internal Medicine

## 2024-04-12 ENCOUNTER — Other Ambulatory Visit (HOSPITAL_BASED_OUTPATIENT_CLINIC_OR_DEPARTMENT_OTHER): Payer: Self-pay

## 2024-04-12 ENCOUNTER — Ambulatory Visit (INDEPENDENT_AMBULATORY_CARE_PROVIDER_SITE_OTHER): Admitting: Internal Medicine

## 2024-04-12 VITALS — BP 124/80 | HR 68 | Ht 65.0 in | Wt 134.0 lb

## 2024-04-12 DIAGNOSIS — E1165 Type 2 diabetes mellitus with hyperglycemia: Secondary | ICD-10-CM | POA: Diagnosis not present

## 2024-04-12 DIAGNOSIS — Z794 Long term (current) use of insulin: Secondary | ICD-10-CM | POA: Diagnosis not present

## 2024-04-12 LAB — POCT GLYCOSYLATED HEMOGLOBIN (HGB A1C): Hemoglobin A1C: 6.4 % — AB (ref 4.0–5.6)

## 2024-04-12 MED ORDER — LYUMJEV 100 UNIT/ML IJ SOLN
30.0000 [IU] | Freq: Every day | INTRAMUSCULAR | 3 refills | Status: AC
  Filled 2024-04-12: qty 30, 100d supply, fill #0
  Filled 2024-10-13: qty 30, 100d supply, fill #1

## 2024-04-12 NOTE — Patient Instructions (Signed)
 HOW TO TREAT LOW BLOOD SUGARS (Blood sugar LESS THAN 70 MG/DL) Please follow the RULE OF 15 for the treatment of hypoglycemia treatment (when your (blood sugars are less than 70 mg/dL)   STEP 1: Take 15 grams of carbohydrates when your blood sugar is low, which includes:  3-4 GLUCOSE TABS  OR 3-4 OZ OF JUICE OR REGULAR SODA OR ONE TUBE OF GLUCOSE GEL    STEP 2: RECHECK blood sugar in 15 MINUTES STEP 3: If your blood sugar is still low at the 15 minute recheck --> then, go back to STEP 1 and treat AGAIN with another 15 grams of carbohydrates.

## 2024-04-12 NOTE — Progress Notes (Signed)
 Name: Kenneth [name]  MRN/ DOB: [id number], [birth year]   Age/ Sex: 68 y.o., male    PCP: Frann Mabel Mt, DO   Reason for Endocrinology Evaluation: Type 2 Diabetes Mellitus     Date of Initial Endocrinology Visit: 10/28/2021    PATIENT IDENTIFIER: Kenneth [name] is a 68 y.o. male with a past medical history of T2DM, CAD, CHF , HTN and ESRD on HD since 2017. The patient presented for initial endocrinology clinic visit on 10/28/2021 for consultative assistance with his diabetes management.    HPI: Kenneth [name] transitioned care from Dr. Beryl . He is accompanied by his wife and interpreter    Diagnosed with DM years ago  > 20 yrs ago . Islet cell Ab negative 2021 Currently checking blood sugars multiple  x / day,  through freestyle libre   Hypoglycemia episodes : yes                Symptoms: shaking                  Frequency: 1/ week Hemoglobin A1c has ranged from 5.2% in 2022, peaking at 8.1% in 2021.   In terms of diet, the patient eats 2 meals a day. Snacks occasionally     Nephrology : Dr. Prescilla . Pt on HD  Monday, Wednesday and Friday  Pulmonary : Dr. Annella  10/2021 for dyspnea   He was trained on the Guardian sensor to our CDE 01/2023  SUBJECTIVE:   During the last visit (01/12/2024): A1c 8.7%       Today (04/12/24): Kenneth [name] is here for follow-up on diabetes management.  He is accompanied by his wife .  Interpretor line used [employer] # [id number]    He  checks his blood sugars multiple times daily, through [employer]. The patient has had hypoglycemic episodes since the last clinic visit. This occur sat night . Pt is symptomatic  Denies nausea or vomiting recently  Denies constipation and no diarrhea  He is having issues with the battery cap of the pump , keeps falling off   This patient with type 2 diabetes is treated with Medtronic  (insulin  pump). During the visit the pump basal and bolus doses were reviewed including carb/insulin  rations and  supplemental doses. The clinical list was updated. The glucose meter download was reviewed in detail to determine if the current pump settings are providing the best glycemic control without excessive hypoglycemia.  Pump and meter download:  Pump   Medtronic 630 G Settings   Insulin  type   Lyumjev    Basal rate       0000 0.450  u/h    0600 0.450 u/h    1800 0.550u/h       I:C ratio       0000 1:12    Enter 40 grams TIDQAC              Sensitivity       0000  55      Goal       0000  110           Type & Model of Pump: medtronic  Insulin  Type: Currently using lyumjev    PUMP STATISTICS: Average BG: 118 -/+ 46 BG Readings: 2.7/ day Average Daily Carbs (g): 48 Average Total Daily Insulin : 16+/- 5.2 Average Daily Basal: 11.4 (71 %) Average Daily Bolus: 4.6 (29%)     CONTINUOUS GLUCOSE MONITORING RECORD INTERPRETATION    Dates of Recording: 7/17-7/30/2025  Sensor description:freestyle libre   Results statistics:   CGM use % of time 51  Average and SD 172/49.7  Time in range 59%  % Time Above 180 21  % Time above 250 16  % Time Below target 0    Glycemic patterns summary: BG's trend down overnight and fluctuate during the day Hyperglycemic episodes  postprandial   Hypoglycemic episodes occurred overnight  Overnight periods: Trends down to optimal    HOME DIABETES REGIMEN: Lyumjev       Statin: yes  ACE-I/ARB: no      DIABETIC COMPLICATIONS: Microvascular complications:  ESRD on HD , neuropathy, left eye DR Last eye exam: Completed 01/05/2024  Macrovascular complications:  CAD ( S/P CABG 2017) Denies: PVD, CVA   PAST HISTORY: Past Medical History:  Past Medical History:  Diagnosis Date   Arthritis    hands   CAD (coronary artery disease) 10/30/2016   NSTEMI 9/17 with CABG x 3 (LIMA-LAD, SVG-OM, SVG-RCA).   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   type 1 DM   Dyspnea    with exertion   ESRD on hemodialysis (HCC)    MWF; [place] [facility] (03/30/2018)   Gastroesophageal reflux disease 08/18/2016   Hyperlipidemia    Hypertension    Ischemic rest pain of lower extremity 02/06/2015   Keratoma 02/27/2015   Metatarsal deformity 02/27/2015   PAF (paroxysmal atrial fibrillation) (HCC)    Pronation deformity of both feet 02/27/2015   Past Surgical History:  Past Surgical History:  Procedure Laterality Date   A/V FISTULAGRAM Left 03/06/2021   Procedure: A/V FISTULAGRAM;  Surgeon: Gretta Lonni PARAS, MD;  [facility]: [facility];  Service: Cardiovascular;  Laterality: Left;   A/V FISTULAGRAM Left 06/02/2022   Procedure: A/V Fistulagram;  Surgeon: Serene Gaile ORN, MD;  Location: [facility];  Service: Cardiovascular;  Laterality: Left;   AV FISTULA PLACEMENT Left 06/22/2016   Procedure: ARTERIOVENOUS (AV) FISTULA CREATION LEFT UPPER ARM;  Surgeon: Krystal JULIANNA Doing, MD;  Location: [facility] OR;  Service: Vascular;  Laterality: Left;   BIOPSY  12/19/2020   Procedure: BIOPSY;  Surgeon: Aneita Gwendlyn DASEN, MD;  Location: [facility] ENDOSCOPY;  Service: Endoscopy;;   BIOPSY  12/15/2022   Procedure: BIOPSY;  Surgeon: Legrand Victory LITTIE DOUGLAS, MD;  Location: [facility];  Service: Gastroenterology;;   CARDIAC CATHETERIZATION N/A 05/31/2016   Procedure: Left Heart Cath and Coronary Angiography;  Surgeon: Candyce GORMAN Reek, MD;  Location: [facility] INVASIVE CV LAB;  Service: Cardiovascular;  Laterality: N/A;   CARDIAC CATHETERIZATION N/A 05/31/2016   Procedure: Right Heart Cath;  Surgeon: Candyce GORMAN Reek, MD;  Location: [facility] INVASIVE CV LAB;  Service: Cardiovascular;  Laterality: N/A;   CARDIAC CATHETERIZATION N/A 05/31/2016   Procedure: IABP Insertion;  Surgeon: Candyce GORMAN Reek, MD;  [facility];  Service: Cardiovascular;  Laterality: N/A;   COLONOSCOPY WITH PROPOFOL  N/A 12/20/2020    Procedure: COLONOSCOPY WITH PROPOFOL ;  Surgeon: Shila Gustav GAILS, MD;  Location: [facility];  Service: Endoscopy;  Laterality: N/A;   COLONOSCOPY WITH PROPOFOL  N/A 12/15/2022   Procedure: COLONOSCOPY WITH PROPOFOL ;  Surgeon: Legrand Victory LITTIE DOUGLAS, MD;  Location: [facility] ENDOSCOPY;  Service: Gastroenterology;  Laterality: N/A;   CORONARY ARTERY BYPASS GRAFT N/A 06/05/2016   Procedure: CORONARY ARTERY BYPASS GRAFTING (CABG) x3 LIMA to LAD -  SVG to OM -SVG to RCA;  Surgeon: Maude Fleeta Ochoa, MD;  Location: [facility] OR;  Service: Open Heart Surgery;  Laterality: N/A;   CORONARY STENT INTERVENTION N/A 07/07/2017   Procedure: CORONARY STENT INTERVENTION;  Surgeon: Darron Deatrice LABOR, MD;  [facility];  Service: Cardiovascular;  Laterality: N/A;   CORONARY STENT INTERVENTION N/A 03/30/2018   Procedure: CORONARY STENT INTERVENTION;  Surgeon: Swaziland, Peter M, MD;  Location: [facility] INVASIVE CV LAB;  Service: Cardiovascular;  Laterality: N/A;   ESOPHAGOGASTRODUODENOSCOPY (EGD) WITH PROPOFOL  N/A 12/19/2020   Procedure: ESOPHAGOGASTRODUODENOSCOPY (EGD) WITH PROPOFOL ;  Surgeon: Aneita Gwendlyn DASEN, MD;  Location: [facility] ENDOSCOPY;  Service: Endoscopy;  Laterality: N/A;   ESOPHAGOGASTRODUODENOSCOPY (EGD) WITH PROPOFOL  N/A 12/15/2022   Procedure: ESOPHAGOGASTRODUODENOSCOPY (EGD) WITH PROPOFOL ;  Surgeon: Legrand Victory LITTIE DOUGLAS, MD;  Location: [facility] ENDOSCOPY;  Service: Gastroenterology;  Laterality: N/A;   EXCHANGE OF A DIALYSIS CATHETER Right 04/16/2022   Procedure: EXCHANGE OF A DIALYSIS CATHETER;  Surgeon: Magda Debby SAILOR, MD;  Location: [facility] OR;  Service: Vascular;  Laterality: Right;   GIVENS CAPSULE STUDY N/A 12/20/2020   Procedure: GIVENS CAPSULE STUDY;  Surgeon: Shila Gustav GAILS, MD;  Location: [facility]OS[facility];  Service: Endoscopy;  Laterality: N/A;   HEMOSTASIS CLIP PLACEMENT  12/15/2022   Procedure: HEMOSTASIS CLIP PLACEMENT;  Surgeon: Legrand Victory LITTIE DOUGLAS, MD;  Location: [facility];  Service: Gastroenterology;;   INSERTION OF  DIALYSIS CATHETER N/A 06/05/2016   Procedure: INSERTION OF DIALYSIS/trialysis CATHETER;  Surgeon: Maude Fleeta Ochoa, MD;  Location: [facility] OR;  Service: Vascular;  Laterality: N/A;   INSERTION OF DIALYSIS CATHETER Right 06/13/2016   Procedure: INSERTION OF DIALYSIS CATHETER RIGHT INTERNAL JUGULAR;  Surgeon: Redell LITTIE Door, MD;  Location: [facility] OR;  Service: Vascular;  Laterality: Right;   INSERTION OF DIALYSIS CATHETER Right 09/07/2019   Procedure: Insertion Of Dialysis Catheter;  Surgeon: Oris Krystal FALCON, MD;  Location: [facility] OR;  Service: Vascular;  Laterality: Right;   INSERTION OF DIALYSIS CATHETER N/A 04/09/2022   Procedure: INSERTION OF TUNNELED DIALYSIS CATHETER;  Surgeon: Magda Debby SAILOR, MD;  Location: [facility] OR;  Service: Vascular;  Laterality: N/A;   INTRAOPERATIVE TRANSESOPHAGEAL ECHOCARDIOGRAM N/A 06/05/2016   Procedure: INTRAOPERATIVE TRANSESOPHAGEAL ECHOCARDIOGRAM;  Surgeon: Maude Fleeta Ochoa, MD;  Location: [facility] OR;  Service: Open Heart Surgery;  Laterality: N/A;   LEFT HEART CATH AND CORS/GRAFTS ANGIOGRAPHY N/A 11/03/2016   Procedure: Left Heart Cath and Cors/Grafts Angiography;  Surgeon: Debby LABOR Sor, MD;  [facility]: [facility];  Service: Cardiovascular;  Laterality: N/A;   LEFT HEART CATH AND CORS/GRAFTS ANGIOGRAPHY N/A 07/07/2017   Procedure: LEFT HEART CATH AND CORS/GRAFTS ANGIOGRAPHY;  Surgeon: Rolan Ezra RAMAN, MD;  Location: [facility] INVASIVE CV LAB;  Service: Cardiovascular;  Laterality: N/A;   LEFT HEART CATH AND CORS/GRAFTS ANGIOGRAPHY N/A 03/30/2018   Procedure: LEFT HEART CATH AND CORS/GRAFTS ANGIOGRAPHY;  Surgeon: Swaziland, Peter M, MD;  Location: [facility] INVASIVE CV LAB;  Service: Cardiovascular;  Laterality: N/A;   LEFT HEART CATH AND CORS/GRAFTS ANGIOGRAPHY N/A 04/17/2019   Procedure: LEFT HEART CATH AND CORS/GRAFTS ANGIOGRAPHY;  Surgeon: Rolan Ezra RAMAN, MD;  Location: [facility] INVASIVE CV LAB;  Service: Cardiovascular;  Laterality: N/A;   LUMBAR LAMINECTOMY/DECOMPRESSION MICRODISCECTOMY Bilateral  09/01/2022   Procedure: Laminectomy and Foraminotomy - bilateral - Lumbar three-Lumbar four;  Surgeon: Louis Victory, MD;  Location: [facility] OR;  Service: Neurosurgery;  Laterality: Bilateral;   PERIPHERAL VASCULAR BALLOON ANGIOPLASTY Left 06/02/2022   Procedure: PERIPHERAL VASCULAR BALLOON ANGIOPLASTY;  Surgeon: Serene Gallus  W, MD;  [facility];  Service: Cardiovascular;  Laterality: Left;   PERIPHERAL VASCULAR INTERVENTION Left 03/06/2021   Procedure: PERIPHERAL VASCULAR INTERVENTION;  Surgeon: Gretta Lonni PARAS, MD;  [facility];  Service: Cardiovascular;  Laterality: Left;   POLYPECTOMY  12/20/2020   Procedure: POLYPECTOMY;  Surgeon: Shila Gustav GAILS, MD;  Location: [facility] ENDOS[facility];  Service: Endoscopy;;   POLYPECTOMY  12/15/2022   Procedure: POLYPECTOMY;  Surgeon: Legrand Victory LITTIE DOUGLAS, MD;  Location: [facility];  Service: Gastroenterology;;   REVISION OF ARTERIOVENOUS GORETEX GRAFT Left 07/27/2019   Procedure: PLICATION OF ANEURYSM OF ARTERIOVENOUS FISTULA  LEFT ARM;  Surgeon: Oris Krystal FALCON, MD;  Location: [facility] OR;  Service: Vascular;  Laterality: Left;   REVISON OF ARTERIOVENOUS FISTULA Left 09/07/2019   Procedure: REVISON OF LEFT UPPER ARM ARTERIOVENOUS FISTULA;  Surgeon: Oris Krystal FALCON, MD;  Location: [facility] OR;  Service: Vascular;  Laterality: Left;   REVISON OF ARTERIOVENOUS FISTULA Left 04/09/2022   Procedure: LEFT ARM FISTULA REVISION;  Surgeon: Magda Debby SAILOR, MD;  Location: [facility] OR;  Service: Vascular;  Laterality: Left;  PERIPHERAL NERVE BLOCK   REVISON OF ARTERIOVENOUS FISTULA Left 04/16/2022   Procedure: REVISON OF ARTERIOVENOUS FISTULA;  Surgeon: Magda Debby SAILOR, MD;  Location: [facility] OR;  Service: Vascular;  Laterality: Left;   RIGHT/LEFT HEART CATH AND CORONARY ANGIOGRAPHY N/A 01/03/2021   Procedure: RIGHT/LEFT HEART CATH AND CORONARY ANGIOGRAPHY;  Surgeon: Rolan Ezra RAMAN, MD;  Location: [facility] INVASIVE CV LAB;  Service: Cardiovascular;  Laterality: N/A;    Social  History:  reports that he quit smoking about 10 years ago. His smoking use included cigarettes. He started smoking about 45 years ago. He has a 52.5 pack-year smoking history. He has never been exposed to tobacco smoke. He has never used smokeless tobacco. He reports that he does not drink alcohol and does not use drugs. Family History:  Family History  Problem Relation Age of Onset   CAD Father    Colon cancer Father    Diabetes Brother    Sleep apnea Son      HOME MEDICATIONS: Allergies as of 04/12/2024   No Known Allergies      Medication List        Accurate as of April 12, 2024  1:39 PM. If you have any questions, ask your nurse or doctor.          ARANESP  (ALBUMIN  FREE) IJ Darbepoetin Alfa  (Aranesp )   atorvastatin  80 MG tablet Commonly known as: LIPITOR  Take 1 tablet (80 mg total) by mouth daily.   calcium  acetate 667 MG capsule Commonly known as: PHOSLO  Take 667 mg by mouth 3 (three) times daily with meals.   carvedilol  12.5 MG tablet Commonly known as: COREG  Take 1 tablet (12.5 mg total) by mouth 2 (two) times daily with a meal.   DULoxetine  30 MG capsule Commonly known as: Cymbalta  Take 1 capsule (30 mg total) by mouth daily.   Eliquis  5 MG Tabs tablet Generic drug: apixaban  Take 1 tablet (5 mg total) by mouth 2 (two) times daily.   ezetimibe  10 MG tablet Commonly known as: ZETIA  Take 1 tablet (10 mg total) by mouth daily.   FreeStyle Libre 2 Sensor Misc 1 Device by Does not apply route every 14 (fourteen) days.   Guardian Sensor 3 Misc 1 Device by Does not apply route as directed.   Guardian Link 3 Transmitter Misc 1 Device by Does not apply route as directed.  HECTOROL  IV 2 mcg.   hydrALAZINE  50 MG tablet Commonly known as: APRESOLINE  Take 1.5 tablets (75 mg total) by mouth 3 (three) times daily.   HYDROcodone -acetaminophen  10-325 MG tablet Commonly known as: NORCO Take 1 tablet by mouth every 4 to 6 hours as needed for pain.   iron  sucrose in sodium chloride  0.9 % 100 mL   isosorbide  mononitrate 60 MG 24 hr tablet Commonly known as: IMDUR  Take 1&1/2 tablets (90 mg total) by mouth daily. Hold on days of dialysis on Mon, Wed, Fri.   lidocaine  5 % Commonly known as: LIDODERM  Place 1 patch onto the skin  as directed. May wear upto12 hours   Lokelma  10 g Pack packet Generic drug: sodium zirconium cyclosilicate  Take 1 packet dissolved in water as directed as directed Take on nondialysis days 3 times a week. Dissolve in 2-3 ounces of water   Lokelma  10 g Pack packet Generic drug: sodium zirconium cyclosilicate  Take 1 packet (10 grams total) dissolved in water by mouth daily.   Lokelma  10 g Pack packet Generic drug: sodium zirconium cyclosilicate  Take 1 packet (10 grams total) dissolved in water by mouth daily.   Lokelma  10 g Pack packet Generic drug: sodium zirconium cyclosilicate  Take 1 packet (10 g) dissolved in water once a day.   Lyumjev  100 UNIT/ML Soln Generic drug: Insulin  Lispro-aabc Max Daily 30 units per insulin  pump   MIRCERA IJ Inject as directed.   OneTouch Verio test strip Generic drug: glucose blood 1 each by Other route in the morning, at noon, in the evening, and at bedtime. Use as instructed   prednisoLONE  acetate 1 % ophthalmic suspension Commonly known as: PRED FORTE  Place 1 drop into the left eye 4 (four) times daily.   SENSIPAR  PO Take 60 mg by mouth.   Stiolto Respimat  2.5-2.5 MCG/ACT Aers Generic drug: Tiotropium Bromide-Olodaterol Inhale 2 puffs by mouth into the lungs daily.   TechLite Pen Needles 32G X 6 MM Misc Generic drug: Insulin  Pen Needle Use as directed 3 times daily   triamcinolone  cream 0.1 % Commonly known as: KENALOG  Apply 1 Application topically 2 (two) times daily.   Voltaren  1 % Gel Generic drug: diclofenac  Sodium Apply 4 g topically.   ZOFRAN  PO 4 mg.         ALLERGIES: No Known Allergies   REVIEW OF SYSTEMS: A comprehensive ROS was  conducted with the patient and is negative except as per HPI     OBJECTIVE:   VITAL SIGNS: BP 124/80 (BP Location: Left Arm, Patient Position: Sitting, Cuff Size: Normal)   Pulse 68   Ht 5' 5 (1.651 m)   Wt 134 lb (60.8 kg)   SpO2 95%   BMI 22.30 kg/m    PHYSICAL EXAM:  General: Pt appears well and is in NAD  Lungs: Clear with good BS bilat   Heart: RRR   Extremities:  Lower extremities - No pretibial edema.  Neuro: MS is good with appropriate affect, pt is alert and Ox3    DM foot exam: 01/12/2024  The skin of the feet is intact without sores or ulcerations. The pedal pulses are 1+ on right and 1+ on left. The sensation is decrease to a screening 5.07, 10 gram monofilament bilaterally      DATA REVIEWED:  Lab Results  Component Value Date   HGBA1C 6.4 (A) 04/12/2024   HGBA1C 8.7 (A) 01/12/2024   HGBA1C 8.5 (A) 10/08/2023    Latest Reference Range & Units 10/25/23 10:15  Sodium 135 - 145 mEq/L 135  Potassium 3.5 - 5.1 mEq/L 4.7  Chloride 96 - 112 mEq/L 92 (L)  CO2 19 - 32 mEq/L 31  Glucose 70 - 99 mg/dL 857 (H)  BUN 6 - 23 mg/dL 38 (H)  Creatinine 9.59 - 1.50 mg/dL 3.18 (HH)  Calcium  8.4 - 10.5 mg/dL 8.5  Alkaline Phosphatase 39 - 117 U/L 163 (H)  Albumin  3.5 - 5.2 g/dL 4.0  AST 0 - 37 U/L 17  ALT 0 - 53 U/L 14  Total Protein 6.0 - 8.3 g/dL 6.5  Total Bilirubin 0.2 - 1.2 mg/dL 0.7  GFR >39.99 mL/min 7.86 (LL)    Latest Reference Range & Units 10/25/23 10:15  Total CHOL/HDL Ratio  3  Cholesterol 0 - 200 mg/dL 97  HDL Cholesterol >60.99 mg/dL 63.39 (L)  LDL (calc) 0 - 99 mg/dL 45  NonHDL  39.56  Triglycerides 0.0 - 149.0 mg/dL 20.9  VLDL 0.0 - 59.9 mg/dL 84.1    ASSESSMENT / PLAN / RECOMMENDATIONS:   1) Type 2 Diabetes Mellitus, optimally controlled, With Neuropathic, retinopathic ,ESRD on HD and macrovascular  complications - Most recent A1c of 6.4 %. Goal A1c < 7.0 %.     - A1c skewed due to ESRD , but this is the lowest that his A1c has been,  this is most likely due to the fact that he is finally bolusing for at least the largest meal of the day, I did encourage the patient to continue to bolus with each meal and to enter carbohydrates -The patient has been noted with hypoglycemia overnight will decrease basal rate as below -Barriers to diabetes self-care is language barrier -He has not been able to get the Guardian sensor due to language barriers, as the company attempted to contact him multiple times but they do not hear from the patient - The patient was advised to contact [employer] to explain the battery Falling off, as he will probably need a new pump or a replacement  - MEDICATIONS:     Pump   Medtronic 630 G Settings   Insulin  type   Lyumjev    Basal rate       0000 0.425 u/h    0600 0.425 u/h    1800 0.550u/h       I:C ratio       0000 1:12    Enter 40 grams TIDQAC              Sensitivity       0000  55      Goal       0000  110         EDUCATION / INSTRUCTIONS: BG monitoring instructions: Patient is instructed to check his blood sugars 3 times a day, before meals . Call [facility] if: BG persistently < 70  I reviewed the Rule of 15 for the treatment of hypoglycemia in detail with the patient. Literature supplied.   2) Diabetic complications:  Eye: Does not have known diabetic retinopathy.  Neuro/ Feet: Does  have known diabetic peripheral neuropathy. Renal: Patient does  have known baseline CKD. He is  on an ACEI/ARB at present.    Follow-up in 4 months    Signed electronically by: Stefano Redgie Butts, MD  [facility] Endocrinology  [facility] [address] [place], [place] [address] Phone: [phone] FAX: [phone]   CC: Frann Mabel Mt, DO [address] [place] [address]  Phone: [phone]  Fax: [phone]    Return to Endocrinology clinic as below: Future Appointments  Date Time Provider  Department Center  06/02/2024  9:00 AM Wendling, Mabel Mt, DO [id number]

## 2024-04-14 ENCOUNTER — Other Ambulatory Visit: Payer: Self-pay | Admitting: Family Medicine

## 2024-04-14 ENCOUNTER — Other Ambulatory Visit (HOSPITAL_BASED_OUTPATIENT_CLINIC_OR_DEPARTMENT_OTHER): Payer: Self-pay

## 2024-04-14 MED ORDER — APIXABAN 5 MG PO TABS
5.0000 mg | ORAL_TABLET | Freq: Two times a day (BID) | ORAL | 2 refills | Status: AC
  Filled 2024-04-14: qty 180, 90d supply, fill #0
  Filled 2024-07-17 (×2): qty 180, 90d supply, fill #1
  Filled 2024-10-17: qty 180, 90d supply, fill #2

## 2024-04-20 ENCOUNTER — Other Ambulatory Visit (HOSPITAL_BASED_OUTPATIENT_CLINIC_OR_DEPARTMENT_OTHER): Payer: Self-pay

## 2024-05-03 ENCOUNTER — Other Ambulatory Visit: Payer: Self-pay | Admitting: Cardiology

## 2024-05-03 ENCOUNTER — Other Ambulatory Visit: Payer: Self-pay

## 2024-05-03 ENCOUNTER — Other Ambulatory Visit (HOSPITAL_BASED_OUTPATIENT_CLINIC_OR_DEPARTMENT_OTHER): Payer: Self-pay

## 2024-05-03 MED ORDER — ISOSORBIDE MONONITRATE ER 60 MG PO TB24
90.0000 mg | ORAL_TABLET | Freq: Every day | ORAL | 5 refills | Status: AC
  Filled 2024-05-03: qty 90, 60d supply, fill #0
  Filled 2024-08-23: qty 90, 60d supply, fill #1

## 2024-05-10 ENCOUNTER — Other Ambulatory Visit: Payer: Self-pay | Admitting: Family Medicine

## 2024-05-10 ENCOUNTER — Other Ambulatory Visit (HOSPITAL_BASED_OUTPATIENT_CLINIC_OR_DEPARTMENT_OTHER): Payer: Self-pay

## 2024-05-10 MED ORDER — LIDOCAINE 5 % EX PTCH
1.0000 | MEDICATED_PATCH | Freq: Two times a day (BID) | CUTANEOUS | 1 refills | Status: DC | PRN
  Filled 2024-05-10: qty 30, 15d supply, fill #0
  Filled 2024-05-22: qty 30, 15d supply, fill #1

## 2024-05-26 ENCOUNTER — Other Ambulatory Visit (HOSPITAL_BASED_OUTPATIENT_CLINIC_OR_DEPARTMENT_OTHER): Payer: Self-pay

## 2024-05-31 ENCOUNTER — Other Ambulatory Visit (HOSPITAL_COMMUNITY): Payer: Self-pay

## 2024-05-31 ENCOUNTER — Other Ambulatory Visit (HOSPITAL_BASED_OUTPATIENT_CLINIC_OR_DEPARTMENT_OTHER): Payer: Self-pay

## 2024-05-31 MED ORDER — HYDRALAZINE HCL 50 MG PO TABS
75.0000 mg | ORAL_TABLET | Freq: Three times a day (TID) | ORAL | 0 refills | Status: AC
  Filled 2024-05-31: qty 210, 47d supply, fill #0

## 2024-06-02 ENCOUNTER — Other Ambulatory Visit (HOSPITAL_BASED_OUTPATIENT_CLINIC_OR_DEPARTMENT_OTHER): Payer: Self-pay

## 2024-06-02 ENCOUNTER — Ambulatory Visit: Payer: Self-pay | Admitting: Family Medicine

## 2024-06-02 ENCOUNTER — Telehealth: Payer: Self-pay | Admitting: Neurology

## 2024-06-02 ENCOUNTER — Ambulatory Visit (INDEPENDENT_AMBULATORY_CARE_PROVIDER_SITE_OTHER): Payer: 59 | Admitting: Family Medicine

## 2024-06-02 ENCOUNTER — Encounter: Payer: Self-pay | Admitting: Family Medicine

## 2024-06-02 ENCOUNTER — Other Ambulatory Visit (HOSPITAL_COMMUNITY): Payer: Self-pay

## 2024-06-02 VITALS — BP 118/76 | HR 76 | Temp 97.8°F | Resp 16 | Ht 65.0 in | Wt 127.8 lb

## 2024-06-02 DIAGNOSIS — G8929 Other chronic pain: Secondary | ICD-10-CM

## 2024-06-02 DIAGNOSIS — I110 Hypertensive heart disease with heart failure: Secondary | ICD-10-CM | POA: Diagnosis not present

## 2024-06-02 DIAGNOSIS — I5030 Unspecified diastolic (congestive) heart failure: Secondary | ICD-10-CM | POA: Diagnosis not present

## 2024-06-02 DIAGNOSIS — M7501 Adhesive capsulitis of right shoulder: Secondary | ICD-10-CM | POA: Diagnosis not present

## 2024-06-02 DIAGNOSIS — Z0000 Encounter for general adult medical examination without abnormal findings: Secondary | ICD-10-CM

## 2024-06-02 DIAGNOSIS — S46811A Strain of other muscles, fascia and tendons at shoulder and upper arm level, right arm, initial encounter: Secondary | ICD-10-CM | POA: Diagnosis not present

## 2024-06-02 LAB — CBC
HCT: 39.6 % (ref 39.0–52.0)
Hemoglobin: 13 g/dL (ref 13.0–17.0)
MCHC: 32.8 g/dL (ref 30.0–36.0)
MCV: 94.9 fl (ref 78.0–100.0)
Platelets: 165 K/uL (ref 150.0–400.0)
RBC: 4.17 Mil/uL — ABNORMAL LOW (ref 4.22–5.81)
RDW: 16.9 % — ABNORMAL HIGH (ref 11.5–15.5)
WBC: 5.2 K/uL (ref 4.0–10.5)

## 2024-06-02 LAB — COMPREHENSIVE METABOLIC PANEL WITH GFR
ALT: 20 U/L (ref 0–53)
AST: 23 U/L (ref 0–37)
Albumin: 4.7 g/dL (ref 3.5–5.2)
Alkaline Phosphatase: 115 U/L (ref 39–117)
BUN: 17 mg/dL (ref 6–23)
CO2: 35 meq/L — ABNORMAL HIGH (ref 19–32)
Calcium: 8.8 mg/dL (ref 8.4–10.5)
Chloride: 92 meq/L — ABNORMAL LOW (ref 96–112)
Creatinine, Ser: 5.74 mg/dL (ref 0.40–1.50)
GFR: 9.6 mL/min — CL (ref >60.00)
Glucose, Bld: 139 mg/dL — ABNORMAL HIGH (ref 70–99)
Potassium: 4.4 meq/L (ref 3.5–5.1)
Sodium: 137 meq/L (ref 135–145)
Total Bilirubin: 0.7 mg/dL (ref 0.2–1.2)
Total Protein: 7.7 g/dL (ref 6.0–8.3)

## 2024-06-02 LAB — LIPID PANEL
Cholesterol: 143 mg/dL (ref 0–200)
HDL: 33.8 mg/dL — ABNORMAL LOW (ref >39.00)
LDL Cholesterol: 73 mg/dL (ref 0–99)
NonHDL: 109.04
Total CHOL/HDL Ratio: 4
Triglycerides: 178 mg/dL — ABNORMAL HIGH (ref 0.0–149.0)
VLDL: 35.6 mg/dL (ref 0.0–40.0)

## 2024-06-02 MED ORDER — KETOCONAZOLE 2 % EX SHAM
1.0000 | MEDICATED_SHAMPOO | CUTANEOUS | 0 refills | Status: AC
  Filled 2024-06-02 (×2): qty 120, 84d supply, fill #0

## 2024-06-02 NOTE — Progress Notes (Signed)
 Chief Complaint  Patient presents with   Annual Exam    CPE    Well Male Kenneth [name] is here for a complete physical.  Here w aid of [place] interpreter.  His last physical was >1 year ago.  Current diet: in general, a so-so diet.   Current exercise: walking Weight trend: stable Fatigue out of ordinary? No. Seat belt? Yes.   Advanced directive? No  Health maintenance Shingrix- No Colonoscopy- Yes Tetanus- Yes Hep C- Yes Lung cancer screening- Yes Pneumonia vaccine- Yes AAA screening- Yes  R shoulder pain 2 mo has had pain over top of shoulder. No inj or change in activity. Happened after he woke up. Decreased ROM. No bruising, redness, swelling. Has not tried anything at home thus far.   Past Medical History:  Diagnosis Date   Arthritis    hands   CAD (coronary artery disease) 10/30/2016   NSTEMI 9/17 with CABG x 3 (LIMA-LAD, SVG-OM, SVG-RCA).   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   type 1 DM   Dyspnea    with exertion   ESRD on hemodialysis (HCC)    MWF; [place] (03/30/2018)   Gastroesophageal reflux disease 08/18/2016   Hyperlipidemia    Hypertension    Ischemic rest pain of lower extremity 02/06/2015   Keratoma 02/27/2015   Metatarsal deformity 02/27/2015   PAF (paroxysmal atrial fibrillation) (HCC)    Pronation deformity of both feet 02/27/2015     Past Surgical History:  Procedure Laterality Date   A/V FISTULAGRAM Left 03/06/2021   Procedure: A/V FISTULAGRAM;  Surgeon: Gretta Lonni PARAS, MD;  [facility]: [facility];  Service: Cardiovascular;  Laterality: Left;   A/V FISTULAGRAM Left 06/02/2022   Procedure: A/V Fistulagram;  Surgeon: Serene Gaile LELON, MD;  Location: [facility];  Service: Cardiovascular;  Laterality: Left;   AV FISTULA PLACEMENT Left 06/22/2016   Procedure: ARTERIOVENOUS (AV)  FISTULA CREATION LEFT UPPER ARM;  Surgeon: Krystal JULIANNA Doing, MD;  Location: [facility] OR;  Service: Vascular;  Laterality: Left;   BIOPSY  12/19/2020   Procedure: BIOPSY;  Surgeon: Aneita Gwendlyn DASEN, MD;  Location: [facility] ENDOSCOPY;  Service: Endoscopy;;   BIOPSY  12/15/2022   Procedure: BIOPSY;  Surgeon: Legrand Victory LITTIE DOUGLAS, MD;  Location: [facility];  Service: Gastroenterology;;   CARDIAC CATHETERIZATION N/A 05/31/2016   Procedure: Left Heart Cath and Coronary Angiography;  Surgeon: Candyce GORMAN Reek, MD;  Location[facility] INVAS[facility] CV LAB;  Service: Cardiovascular;  Laterality: N/A;   CARDIAC CATHETERIZATION N/A 05/31/2016   Procedure: Right Heart Cath;  Surgeon: Candyce GORMAN Reek, MD;  Location: [facility] INVASIVE CV LAB;  Service: Cardiovascular;  Laterality: N/A;   CARDIAC CATHETERIZATION N/A 05/31/2016   Procedure: IABP Insertion;  Surgeon: Candyce GORMAN Reek, MD;  [facility]: [facility];  Service: Cardiovascular;  Laterality: N/A;   COLONOSCOPY WITH PROPOFOL  N/A 12/20/2020   Procedure: COLONOSCOPY WITH PROPOFOL ;  Surgeon: Shila Gustav GAILS, MD;  Location: [facility] ENDOSCOPY;  Service: Endoscopy;  Laterality: N/A;   COLONOSCOPY WITH PROPOFOL  N/A 12/15/2022   Procedure: COLONOSCOPY WITH PROPOFOL ;  Surgeon: Legrand Victory LITTIE DOUGLAS, MD;  Location: [facility] ENDOSCOPY;  Service: Gastroenterology;  Laterality: N/A;   CORONARY ARTERY BYPASS GRAFT N/A 06/05/2016   Procedure: CORONARY ARTERY BYPASS GRAFTING (CABG) x3 LIMA to LAD -SVG to OM -SVG  to RCA;  Surgeon: Maude Fleeta Ochoa, MD;  Location: [facility] OR;  Service: Open Heart Surgery;  Laterality: N/A;   CORONARY STENT INTERVENTION N/A 07/07/2017   Procedure: CORONARY STENT INTERVENTION;  Surgeon: Darron Deatrice LABOR, MD;  [facility];  Service: Cardiovascular;  Laterality: N/A;   CORONARY STENT INTERVENTION N/A 03/30/2018   Procedure: CORONARY STENT INTERVENTION;  Surgeon: [place], Peter M, MD;  Location: [facility] INVASIVE CV LAB;  Service: Cardiovascular;  Laterality: N/A;    ESOPHAGOGASTRODUODENOSCOPY (EGD) WITH PROPOFOL  N/A 12/19/2020   Procedure: ESOPHAGOGASTRODUODENOSCOPY (EGD) WITH PROPOFOL ;  Surgeon: Aneita Gwendlyn DASEN, MD;  Location: [facility] ENDOSCOPY;  Service: Endoscopy;  Laterality: N/A;   ESOPHAGOGASTRODUODENOSCOPY (EGD) WITH PROPOFOL  N/A 12/15/2022   Procedure: ESOPHAGOGASTRODUODENOSCOPY (EGD) WITH PROPOFOL ;  Surgeon: Legrand Victory LITTIE DOUGLAS, MD;  Location: [facility] ENDOSCOPY;  Service: Gastroenterology;  Laterality: N/A;   EXCHANGE OF A DIALYSIS CATHETER Right 04/16/2022   Procedure: EXCHANGE OF A DIALYSIS CATHETER;  Surgeon: Magda Debby SAILOR, MD;  Location: [facility] OR;  Service: Vascular;  Laterality: Right;   GIVENS CAPSULE STUDY N/A 12/20/2020   Procedure: GIVENS CAPSULE STUDY;  Surgeon: Shila Gustav GAILS, MD;  Location: [facility] ENDOSCOPY;  Service: Endoscopy;  Laterality: N/A;   HEMOSTASIS CLIP PLACEMENT  12/15/2022   Procedure: HEMOSTASIS CLIP PLACEMENT;  Surgeon: Legrand Victory LITTIE DOUGLAS, MD;  Location: [facility];  Service: Gastroenterology;;   INSERTION OF DIALYSIS CATHETER N/A 06/05/2016   Procedure: INSERTION OF DIALYSIS/trialysis CATHETER;  Surgeon: Maude Fleeta Ochoa, MD;  Location: [facility] OR;  Service: Vascular;  Laterality: N/A;   INSERTION OF DIALYSIS CATHETER Right 06/13/2016   Procedure: INSERTION OF DIALYSIS CATHETER RIGHT INTERNAL JUGULAR;  Surgeon: Redell LITTIE Door, MD;  Location: [facility] OR;  Service: Vascular;  Laterality: Right;   INSERTION OF DIALYSIS CATHETER Right 09/07/2019   Procedure: Insertion Of Dialysis Catheter;  Surgeon: Oris Krystal FALCON, MD;  Location: [facility] OR;  Service: Vascular;  Laterality: Right;   INSERTION OF DIALYSIS CATHETER N/A 04/09/2022   Procedure: INSERTION OF TUNNELED DIALYSIS CATHETER;  Surgeon: Magda Debby SAILOR, MD;  Location: [facility] OR;  Service: Vascular;  Laterality: N/A;   INTRAOPERATIVE TRANSESOPHAGEAL ECHOCARDIOGRAM N/A 06/05/2016   Procedure: INTRAOPERATIVE TRANSESOPHAGEAL ECHOCARDIOGRAM;  Surgeon: Maude Fleeta Ochoa, MD;  Location: [facility] OR;  Service: Open Heart Surgery;   Laterality: N/A;   LEFT HEART CATH AND CORS/GRAFTS ANGIOGRAPHY N/A 11/03/2016   Procedure: Left Heart Cath and Cors/Grafts Angiography;  Surgeon: Debby LABOR Sor, MD;  [facility];  Service: Cardiovascular;  Laterality: N/A;   LEFT HEART CATH AND CORS/GRAFTS ANGIOGRAPHY N/A 07/07/2017   Procedure: LEFT HEART CATH AND CORS/GRAFTS ANGIOGRAPHY;  Surgeon: Rolan Ezra RAMAN, MD;  Location: [facility] INVASIVE CV LAB;  Service: Cardiovascular;  Laterality: N/A;   LEFT HEART CATH AND CORS/GRAFTS ANGIOGRAPHY N/A 03/30/2018   Procedure: LEFT HEART CATH AND CORS/GRAFTS ANGIOGRAPHY;  Surgeon: [place], Peter M, MD;  Location: [facility] INVASIVE CV LAB;  Service: Cardiovascular;  Laterality: N/A;   LEFT HEART CATH AND CORS/GRAFTS ANGIOGRAPHY N/A 04/17/2019   Procedure: LEFT HEART CATH AND CORS/GRAFTS ANGIOGRAPHY;  Surgeon: Rolan Ezra RAMAN, MD;  Location: [facility] INVASIVE CV LAB;  Service: Cardiovascular;  Laterality: N/A;   LUMBAR LAMINECTOMY/DECOMPRESSION MICRODISCECTOMY Bilateral 09/01/2022   Procedure: Laminectomy and Foraminotomy - bilateral - Lumbar three-Lumbar four;  Surgeon: Louis Victory, MD;  Location: [facility] OR;  Service: Neurosurgery;  Laterality: Bilateral;   PERIPHERAL VASCULAR BALLOON ANGIOPLASTY Left 06/02/2022   Procedure: PERIPHERAL VASCULAR BALLOON ANGIOPLASTY;  Surgeon: Serene Gaile ORN, MD;  Location:  [facility];  Service: Cardiovascular;  Laterality: Left;   PERIPHERAL VASCULAR INTERVENTION Left 03/06/2021   Procedure: PERIPHERAL VASCULAR INTERVENTION;  Surgeon: Gretta Lonni PARAS, MD;  [facility]: [facility];  Service: Cardiovascular;  Laterality: Left;   POLYPECTOMY  12/20/2020   Procedure: POLYPECTOMY;  Surgeon: Shila Gustav GAILS, MD;  Location: [facility] ENDOSCOPY;  Service: Endoscopy;;   POLYPECTOMY  12/15/2022   Procedure: POLYPECTOMY;  Surgeon: Legrand Victory LITTIE DOUGLAS, MD;  Location: [facility];  Service: Gastroenterology;;   REVISION OF ARTERIOVENOUS GORETEX GRAFT Left 07/27/2019   Procedure:  PLICATION OF ANEURYSM OF ARTERIOVENOUS FISTULA  LEFT ARM;  Surgeon: Oris Krystal FALCON, MD;  Location: [facility] OR;  Service: Vascular;  Laterality: Left;   REVISON OF ARTERIOVENOUS FISTULA Left 09/07/2019   Procedure: REVISON OF LEFT UPPER ARM ARTERIOVENOUS FISTULA;  Surgeon: Oris Krystal FALCON, MD;  Location: [facility] OR;  Service: Vascular;  Laterality: Left;   REVISON OF ARTERIOVENOUS FISTULA Left 04/09/2022   Procedure: LEFT ARM FISTULA REVISION;  Surgeon: Magda Debby SAILOR, MD;  Location: [facility] OR;  Service: Vascular;  Laterality: Left;  PERIPHERAL NERVE BLOCK   REVISON OF ARTERIOVENOUS FISTULA Left 04/16/2022   Procedure: REVISON OF ARTERIOVENOUS FISTULA;  Surgeon: Magda Debby SAILOR, MD;  Location: [facility] OR;  Service: Vascular;  Laterality: Left;   RIGHT/LEFT HEART CATH AND CORONARY ANGIOGRAPHY N/A 01/03/2021   Procedure: RIGHT/LEFT HEART CATH AND CORONARY ANGIOGRAPHY;  Surgeon: Rolan Ezra RAMAN, MD;  Location: [facility] INVASIVE CV LAB;  Service: Cardiovascular;  Laterality: N/A;    Medications  Current Outpatient Medications on File Prior to Visit  Medication Sig Dispense Refill   apixaban  (ELIQUIS ) 5 MG TABS tablet Take 1 tablet (5 mg total) by mouth 2 (two) times daily. 180 tablet 2   atorvastatin  (LIPITOR ) 80 MG tablet Take 1 tablet (80 mg total) by mouth daily. 90 tablet 3   calcium  acetate (PHOSLO ) 667 MG capsule Take 667 mg by mouth 3 (three) times daily with meals.      carvedilol  (COREG ) 12.5 MG tablet Take 1 tablet (12.5 mg total) by mouth 2 (two) times daily with a meal. 180 tablet 3   Cinacalcet  HCl (SENSIPAR  PO) Take 60 mg by mouth.     Continuous Blood Gluc Sensor (FREESTYLE LIBRE 2 SENSOR) MISC 1 Device by Does not apply route every 14 (fourteen) days. 6 each 3   Continuous Blood Gluc Sensor (GUARDIAN SENSOR 3) MISC 1 Device by Does not apply route as directed. 9 each 3   Continuous Blood Gluc Transmit (GUARDIAN LINK 3 TRANSMITTER) MISC 1 Device by Does not apply route as directed. 1 each 3   Darbepoetin Alfa   (ARANESP , ALBUMIN  FREE, IJ) Darbepoetin Alfa  (Aranesp )     diclofenac  Sodium (VOLTAREN ) 1 % GEL Apply 4 g topically.     Doxercalciferol  (HECTOROL  IV) 2 mcg.     DULoxetine  (CYMBALTA ) 30 MG capsule Take 1 capsule (30 mg total) by mouth daily. 90 capsule 3   ezetimibe  (ZETIA ) 10 MG tablet Take 1 tablet (10 mg total) by mouth daily. 90 tablet 3   glucose blood (ONETOUCH VERIO) test strip 1 each by Other route in the morning, at noon, in the evening, and at bedtime. Use as instructed 400 each 2   hydrALAZINE  (APRESOLINE ) 50 MG tablet Take 1.5 tablets (75 mg total) by mouth 3 (three) times daily. NEEDS FOLLOW UP APPOINTMENT FOR ANYMORE REFILLS 210 tablet 0   HYDROcodone -acetaminophen  (NORCO) 10-325 MG tablet Take 1 tablet by mouth every 4 to  6 hours as needed for pain. 60 tablet 0   Insulin  Lispro-aabc (LYUMJEV ) 100 UNIT/ML SOLN Max Daily 30 units per insulin  pump 30 mL 3   Insulin  Pen Needle (PEN NEEDLES) 32G X 6 MM MISC Use as directed 3 times daily 300 each 1   isosorbide  mononitrate (IMDUR ) 60 MG 24 hr tablet Take 1.5 tablets (90 mg total) by mouth daily. PLEASE SCHEDULE APPOINTMENT FOR MORE REFILLS 90 tablet 5   lidocaine  (LIDODERM ) 5 % Place 1 patch onto the skin as directed. May wear up to12 hours. 30 patch 1   Methoxy PEG-Epoetin Beta (MIRCERA IJ) Inject as directed.     Ondansetron  HCl (ZOFRAN  PO) 4 mg.     prednisoLONE  acetate (PRED FORTE ) 1 % ophthalmic suspension Place 1 drop into the left eye 4 (four) times daily.     sodium zirconium cyclosilicate  (LOKELMA ) 10 g PACK packet Take 1 packet dissolved in water as directed as directed Take on nondialysis days 3 times a week. Dissolve in 2-3 ounces of water 13 packet 11   sodium zirconium cyclosilicate  (LOKELMA ) 10 g PACK packet Take 1 packet (10 grams total) dissolved in water by mouth daily. 90 packet 3   sodium zirconium cyclosilicate  (LOKELMA ) 10 g PACK packet Take 1 packet (10 grams total) dissolved in water by mouth daily. 90 packet 3    sodium zirconium cyclosilicate  (LOKELMA ) 10 g PACK packet Take 1 packet (10 g) dissolved in water once a day. 90 packet 3   Tiotropium Bromide-Olodaterol (STIOLTO RESPIMAT ) 2.5-2.5 MCG/ACT AERS Inhale 2 puffs by mouth into the lungs daily. 4 g 11   triamcinolone  cream (KENALOG ) 0.1 % Apply 1 Application topically 2 (two) times daily. 30 g 0   Allergies No Known Allergies  Family History Family History  Problem Relation Age of Onset   CAD Father    Colon cancer Father    Diabetes Brother    Sleep apnea Son     Review of Systems: Constitutional:  no fevers Eye:  no recent significant change in vision Ears:  No changes in hearing Nose/Mouth/Throat:  no complaints of nasal congestion, no sore throat Cardiovascular: no chest pain Respiratory:  No shortness of breath Gastrointestinal:  No change in bowel habits GU:  No frequency Integumentary:  no abnormal skin lesions reported MSK: As noted in HPI Neurologic:  no headaches Endocrine:  denies unexplained weight changes  Exam BP 118/76 (BP Location: Left Arm, Patient Position: Sitting)   Pulse 76   Temp 97.8 F (36.6 C) (Oral)   Resp 16   Ht 5' 5 (1.651 m)   Wt 127 lb 12.8 oz (58 kg)   SpO2 97%   BMI 21.27 kg/m  General:  well developed, well nourished, in no apparent distress Skin:  no significant moles, warts, or growths Head:  no masses, lesions, or tenderness Eyes:  pupils equal and round, sclera anicteric without injection Ears:  canals without lesions, TMs shiny without retraction, no obvious effusion, no erythema Nose:  nares patent, mucosa normal Throat/Pharynx:  lips and gingiva without lesion; tongue and uvula midline; non-inflamed pharynx; no exudates or postnasal drainage Lungs:  clear to auscultation, breath sounds equal bilaterally, no respiratory distress Cardio:  regular rate and rhythm, no LE edema or bruits Rectal: Deferred GI: BS+, S, NT, ND, no masses or organomegaly Musculoskeletal: Decreased active  and passive range of motion, positive Neer's, empty can; TTP over the right trapezius musculature; negative crossover, Hawkins, speeds, liftoff, O'Brien's; otherwise symmetrical muscle groups noted  without atrophy or deformity Neuro:  gait normal; deep tendon reflexes normal and symmetric Psych: well oriented with normal range of affect and appropriate judgment/insight  Assessment and Plan  Well adult exam  Chronic right shoulder pain  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 male. Counseled on diet and exercise. Other orders as above. Advanced directive form provided today.  Right shoulder: Chronic, not currently controlled.  Stretches and exercises for the trapezius and shoulder provided.  Heat, ice, Tylenol .  Follow-up in 1 month if not significantly improved and would consider both physical therapy and injections. Follow up in 6 months for med check otherwise.  The patient voiced understanding and agreement to the plan.  Mabel [place], DO 06/02/24 10:35 AM

## 2024-06-02 NOTE — Telephone Encounter (Signed)
 CRITICAL VALUE STICKER  CRITICAL VALUE: Creatinine 5.74 GFR 9.60   RECEIVER (on-site recipient of call): Zylpha Poynor  DATE & TIME NOTIFIED: 06/02/2024 at 2:35 pm  MESSENGER (representative from lab): Sanji

## 2024-06-02 NOTE — Patient Instructions (Signed)
 Give us  2-3 business days to get the results of your labs back.   Keep the diet clean and stay active.  Ice/cold pack over area for 10-15 min twice daily.  Heat (pad or rice pillow in microwave) over affected area, 10-15 minutes twice daily.   OK to take Tylenol  1000 mg (2 extra strength tabs) or 975 mg (3 regular strength tabs) every 6 hours as needed.  If no better in the next mo, please come see us .   Please get me a copy of your advanced directive form at your convenience.   Let us  know if you need anything.  Trapezius stretches/exercises Do exercises exactly as told by your health care provider and adjust them as directed. It is normal to feel mild stretching, pulling, tightness, or discomfort as you do these exercises, but you should stop right away if you feel sudden pain or your pain gets worse.   Stretching and range of motion exercises These exercises warm up your muscles and joints and improve the movement and flexibility of your shoulder. These exercises can also help to relieve pain, numbness, and tingling. If you are unable to do any of the following for any reason, do not further attempt to do it.   Exercise A: Flexion, standing     Stand and hold a broomstick, a cane, or a similar object. Place your hands a little more than shoulder-width apart on the object. Your left / right hand should be palm-up, and your other hand should be palm-down. Push the stick to raise your left / right arm out to your side and then over your head. Use your other hand to help move the stick. Stop when you feel a stretch in your shoulder, or when you reach the angle that is recommended by your health care provider. Avoid shrugging your shoulder while you raise your arm. Keep your shoulder blade tucked down toward your spine. Hold for 30 seconds. Slowly return to the starting position. Repeat 2 times. Complete this exercise 3 times per week.  Exercise B: Abduction, supine     Lie on your  back and hold a broomstick, a cane, or a similar object. Place your hands a little more than shoulder-width apart on the object. Your left / right hand should be palm-up, and your other hand should be palm-down. Push the stick to raise your left / right arm out to your side and then over your head. Use your other hand to help move the stick. Stop when you feel a stretch in your shoulder, or when you reach the angle that is recommended by your health care provider. Avoid shrugging your shoulder while you raise your arm. Keep your shoulder blade tucked down toward your spine. Hold for 30 seconds. Slowly return to the starting position. Repeat 2 times. Complete this exercise 3 times per week.  Exercise C: Flexion, active-assisted     Lie on your back. You may bend your knees for comfort. Hold a broomstick, a cane, or a similar object. Place your hands about shoulder-width apart on the object. Your palms should face toward your feet. Raise the stick and move your arms over your head and behind your head, toward the floor. Use your healthy arm to help your left / right arm move farther. Stop when you feel a gentle stretch in your shoulder, or when you reach the angle where your health care provider tells you to stop. Hold for 30 seconds. Slowly return to the starting position.  Repeat 2 times. Complete this exercise 3 times per week.  Exercise D: External rotation and abduction     Stand in a door frame with one of your feet slightly in front of the other. This is called a staggered stance. Choose one of the following positions as told by your health care provider: Place your hands and forearms on the door frame above your head. Place your hands and forearms on the door frame at the height of your head. Place your hands on the door frame at the height of your elbows. Slowly move your weight onto your front foot until you feel a stretch across your chest and in the front of your shoulders. Keep  your head and chest upright and keep your abdominal muscles tight. Hold for 30 seconds. To release the stretch, shift your weight to your back foot. Repeat 2 times. Complete this stretch 3 times per week.  Strengthening exercises These exercises build strength and endurance in your shoulder. Endurance is the ability to use your muscles for a long time, even after your muscles get tired. Exercise E: Scapular depression and adduction  Sit on a stable chair. Support your arms in front of you with pillows, armrests, or a tabletop. Keep your elbows in line with the sides of your body. Gently move your shoulder blades down toward your middle back. Relax the muscles on the tops of your shoulders and in the back of your neck. Hold for 3 seconds. Slowly release the tension and relax your muscles completely before doing this exercise again. Repeat for a total of 10 repetitions. After you have practiced this exercise, try doing the exercise without the arm support. Then, try the exercise while standing instead of sitting. Repeat 2 times. Complete this exercise 3 times per week.  Exercise F: Shoulder abduction, isometric     Stand or sit about 4-6 inches (10-15 cm) from a wall with your left / right side facing the wall. Bend your left / right elbow and gently press your elbow against the wall. Increase the pressure slowly until you are pressing as hard as you can without shrugging your shoulder. Hold for 3 seconds. Slowly release the tension and relax your muscles completely. Repeat for a total of 10 repetitions. Repeat 2 times. Complete this exercise 3 times per week.  Exercise G: Shoulder flexion, isometric     Stand or sit about 4-6 inches (10-15 cm) away from a wall with your left / right side facing the wall. Keep your left / right elbow straight and gently press the top of your fist against the wall. Increase the pressure slowly until you are pressing as hard as you can without shrugging your  shoulder. Hold for 10-15 seconds. Slowly release the tension and relax your muscles completely. Repeat for a total of 10 repetitions. Repeat 2 times. Complete this exercise 3 times per week.  Exercise H: Internal rotation     Sit in a stable chair without armrests, or stand. Secure an exercise band at your left / right side, at elbow height. Place a soft object, such as a folded towel or a small pillow, under your left / right upper arm so your elbow is a few inches (about 8 cm) away from your side. Hold the end of the exercise band so the band stretches. Keeping your elbow pressed against the soft object under your arm, move your forearm across your body toward your abdomen. Keep your body steady so the movement is only  coming from your shoulder. Hold for 3 seconds. Slowly return to the starting position. Repeat for a total of 10 repetitions. Repeat 2 times. Complete this exercise 3 times per week.  Exercise I: External rotation     Sit in a stable chair without armrests, or stand. Secure an exercise band at your left / right side, at elbow height. Place a soft object, such as a folded towel or a small pillow, under your left / right upper arm so your elbow is a few inches (about 8 cm) away from your side. Hold the end of the exercise band so the band stretches. Keeping your elbow pressed against the soft object under your arm, move your forearm out, away from your abdomen. Keep your body steady so the movement is only coming from your shoulder. Hold for 3 seconds. Slowly return to the starting position. Repeat for a total of 10 repetitions. Repeat 2 times. Complete this exercise 3 times per week. Exercise J: Shoulder extension  Sit in a stable chair without armrests, or stand. Secure an exercise band to a stable object in front of you so the band is at shoulder height. Hold one end of the exercise band in each hand. Your palms should face each other. Straighten your elbows and lift  your hands up to shoulder height. Step back, away from the secured end of the exercise band, until the band stretches. Squeeze your shoulder blades together and pull your hands down to the sides of your thighs. Stop when your hands are straight down by your sides. Do not let your hands go behind your body. Hold for 3 seconds. Slowly return to the starting position. Repeat for a total of 10 repetitions. Repeat 2 times. Complete this exercise 3 times per week.  Exercise K: Shoulder extension, prone     Lie on your abdomen on a firm surface so your left / right arm hangs over the edge. Hold a 5 lb weight in your hand so your palm faces in toward your body. Your arm should be straight. Squeeze your shoulder blade down toward the middle of your back. Slowly raise your arm behind you, up to the height of the surface that you are lying on. Keep your arm straight. Hold for 3 seconds. Slowly return to the starting position and relax your muscles. Repeat for a total of 10 repetitions. Repeat 2 times. Complete this exercise 3 times per week.   Exercise L: Horizontal abduction, prone  Lie on your abdomen on a firm surface so your left / right arm hangs over the edge. Hold a 5 lb weight in your hand so your palm faces toward your feet. Your arm should be straight. Squeeze your shoulder blade down toward the middle of your back. Bend your elbow so your hand moves up, until your elbow is bent to an L shape (90 degrees). With your elbow bent, slowly move your forearm forward and up. Raise your hand up to the height of the surface that you are lying on. Your upper arm should not move, and your elbow should stay bent. At the top of the movement, your palm should face the floor. Hold for 3 seconds. Slowly return to the starting position and relax your muscles. Repeat for a total of 10 repetitions. Repeat 2 times. Complete this exercise 3 times per week.  Exercise M: Horizontal abduction, standing  Sit  on a stable chair, or stand. Secure an exercise band to a stable object in front of  you so the band is at shoulder height. Hold one end of the exercise band in each hand. Straighten your elbows and lift your hands straight in front of you, up to shoulder height. Your palms should face down, toward the floor. Step back, away from the secured end of the exercise band, until the band stretches. Move your arms out to your sides, and keep your arms straight. Hold for 3 seconds. Slowly return to the starting position. Repeat for a total of 10 repetitions. Repeat 2 times. Complete this exercise 3 times per week.  Exercise N: Scapular retraction and elevation  Sit on a stable chair, or stand. Secure an exercise band to a stable object in front of you so the band is at shoulder height. Hold one end of the exercise band in each hand. Your palms should face each other. Sit in a stable chair without armrests, or stand. Step back, away from the secured end of the exercise band, until the band stretches. Squeeze your shoulder blades together and lift your hands over your head. Keep your elbows straight. Hold for 3 seconds. Slowly return to the starting position. Repeat for a total of 10 repetitions. Repeat 2 times. Complete this exercise 3 times per week.  This information is not intended to replace advice given to you by your health care provider. Make sure you discuss any questions you have with your health care provider. Document Released: 08/31/2005 Document Revised: 05/07/2016 Document Reviewed: 07/18/2015 [employer] Interactive Patient Education  2017 [employer]  EXERCISES  RANGE OF MOTION (ROM) AND STRETCHING EXERCISES These exercises may help you when beginning to rehabilitate your injury. While completing these exercises, remember:  Restoring tissue flexibility helps normal motion to return to the joints. This allows healthier, less painful movement and activity. An effective stretch should  be held for at least 30 seconds. A stretch should never be painful. You should only feel a gentle lengthening or release in the stretched tissue.  ROM - Pendulum Bend at the waist so that your right / left arm falls away from your body. Support yourself with your opposite hand on a solid surface, such as a table or a countertop. Your right / left arm should be perpendicular to the ground. If it is not perpendicular, you need to lean over farther. Relax the muscles in your right / left arm and shoulder as much as possible. Gently sway your hips and trunk so they move your right / left arm without any use of your right / left shoulder muscles. Progress your movements so that your right / left arm moves side to side, then forward and backward, and finally, both clockwise and counterclockwise. Complete 10-15 repetitions in each direction. Many people use this exercise to relieve discomfort in their shoulder as well as to gain range of motion. Repeat 2 times. Complete this exercise 3 times per week.  STRETCH - Flexion, Standing Stand with good posture. With an underhand grip on your right / left hand and an overhand grip on the opposite hand, grasp a broomstick or cane so that your hands are a little more than shoulder-width apart. Keeping your right / left elbow straight and shoulder muscles relaxed, push the stick with your opposite hand to raise your right / left arm in front of your body and then overhead. Raise your arm until you feel a stretch in your right / left shoulder, but before you have increased shoulder pain. Try to avoid shrugging your right / left shoulder  as your arm rises by keeping your shoulder blade tucked down and toward your mid-back spine. Hold 30 seconds. Slowly return to the starting position. Repeat 2 times. Complete this exercise 3 times per week.  STRETCH - Internal Rotation Place your right / left hand behind your back, palm-up. Throw a towel or belt over your opposite  shoulder. Grasp the towel/belt with your right / left hand. While keeping an upright posture, gently pull up on the towel/belt until you feel a stretch in the front of your right / left shoulder. Avoid shrugging your right / left shoulder as your arm rises by keeping your shoulder blade tucked down and toward your mid-back spine. Hold 30. Release the stretch by lowering your opposite hand. Repeat 2 times. Complete this exercise 3 times per week.  STRETCH - External Rotation and Abduction Stagger your stance through a doorframe. It does not matter which foot is forward. As instructed by your physician, physical therapist or athletic trainer, place your hands: And forearms above your head and on the door frame. And forearms at head-height and on the door frame. At elbow-height and on the door frame. Keeping your head and chest upright and your stomach muscles tight to prevent over-extending your low-back, slowly shift your weight onto your front foot until you feel a stretch across your chest and/or in the front of your shoulders. Hold 30 seconds. Shift your weight to your back foot to release the stretch. Repeat 2 times. Complete this stretch 3 times per week.   STRENGTHENING EXERCISES  These exercises may help you when beginning to rehabilitate your injury. They may resolve your symptoms with or without further involvement from your physician, physical therapist or athletic trainer. While completing these exercises, remember:  Muscles can gain both the endurance and the strength needed for everyday activities through controlled exercises. Complete these exercises as instructed by your physician, physical therapist or athletic trainer. Progress the resistance and repetitions only as guided. You may experience muscle soreness or fatigue, but the pain or discomfort you are trying to eliminate should never worsen during these exercises. If this pain does worsen, stop and make certain you are  following the directions exactly. If the pain is still present after adjustments, discontinue the exercise until you can discuss the trouble with your clinician. If advised by your physician, during your recovery, avoid activity or exercises which involve actions that place your right / left hand or elbow above your head or behind your back or head. These positions stress the tissues which are trying to heal.  STRENGTH - Scapular Depression and Adduction With good posture, sit on a firm chair. Supported your arms in front of you with pillows, arm rests or a table top. Have your elbows in line with the sides of your body. Gently draw your shoulder blades down and toward your mid-back spine. Gradually increase the tension without tensing the muscles along the top of your shoulders and the back of your neck. Hold for 3 seconds. Slowly release the tension and relax your muscles completely before completing the next repetition. After you have practiced this exercise, remove the arm support and complete it in standing as well as sitting. Repeat 2 times. Complete this exercise 3 times per week.   STRENGTH - External Rotators Secure a rubber exercise band/tubing to a fixed object so that it is at the same height as your right / left elbow when you are standing or sitting on a firm surface. Stand or sit  so that the secured exercise band/tubing is at your side that is not injured. Bend your elbow 90 degrees. Place a folded towel or small pillow under your right / left arm so that your elbow is a few inches away from your side. Keeping the tension on the exercise band/tubing, pull it away from your body, as if pivoting on your elbow. Be sure to keep your body steady so that the movement is only coming from your shoulder rotating. Hold 3 seconds. Release the tension in a controlled manner as you return to the starting position. Repeat 2 times. Complete this exercise 3 times per week.   STRENGTH -  Supraspinatus Stand or sit with good posture. Grasp a 2-3 lb weight or an exercise band/tubing so that your hand is thumbs-up, like when you shake hands. Slowly lift your right / left hand from your thigh into the air, traveling about 30 degrees from straight out at your side. Lift your hand to shoulder height or as far as you can without increasing any shoulder pain. Initially, many people do not lift their hands above shoulder height. Avoid shrugging your right / left shoulder as your arm rises by keeping your shoulder blade tucked down and toward your mid-back spine. Hold for 3 seconds. Control the descent of your hand as you slowly return to your starting position. Repeat 2 times. Complete this exercise 3 times per week.   STRENGTH - Shoulder Extensors Secure a rubber exercise band/tubing so that it is at the height of your shoulders when you are either standing or sitting on a firm arm-less chair. With a thumbs-up grip, grasp an end of the band/tubing in each hand. Straighten your elbows and lift your hands straight in front of you at shoulder height. Step back away from the secured end of band/tubing until it becomes tense. Squeezing your shoulder blades together, pull your hands down to the sides of your thighs. Do not allow your hands to go behind you. Hold for 3 seconds. Slowly ease the tension on the band/tubing as you reverse the directions and return to the starting position. Repeat 2 times. Complete this exercise 3 times per week.   STRENGTH - Scapular Retractors Secure a rubber exercise band/tubing so that it is at the height of your shoulders when you are either standing or sitting on a firm arm-less chair. With a palm-down grip, grasp an end of the band/tubing in each hand. Straighten your elbows and lift your hands straight in front of you at shoulder height. Step back away from the secured end of band/tubing until it becomes tense. Squeezing your shoulder blades together, draw  your elbows back as you bend them. Keep your upper arm lifted away from your body throughout the exercise. Hold 3 seconds. Slowly ease the tension on the band/tubing as you reverse the directions and return to the starting position. Repeat 2 times. Complete this exercise 3 times per week.  STRENGTH - Scapular Depressors Find a sturdy chair without wheels, such as a from a dining room table. Keeping your feet on the floor, lift your bottom from the seat and lock your elbows. Keeping your elbows straight, allow gravity to pull your body weight down. Your shoulders will rise toward your ears. Raise your body against gravity by drawing your shoulder blades down your back, shortening the distance between your shoulders and ears. Although your feet should always maintain contact with the floor, your feet should progressively support less body weight as you get stronger.  Hold 3 seconds. In a controlled and slow manner, lower your body weight to begin the next repetition. Repeat 2 times. Complete this exercise 3 times per week.    This information is not intended to replace advice given to you by your health care provider. Make sure you discuss any questions you have with your health care provider.   Document Released: 07/15/2005 Document Revised: 09/21/2014 Document Reviewed: 12/13/2008 [employer] Interactive Patient Education [employer]

## 2024-06-02 NOTE — Telephone Encounter (Signed)
 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 Stable.

## 2024-06-15 ENCOUNTER — Other Ambulatory Visit (HOSPITAL_BASED_OUTPATIENT_CLINIC_OR_DEPARTMENT_OTHER): Payer: Self-pay

## 2024-06-15 ENCOUNTER — Other Ambulatory Visit: Payer: Self-pay

## 2024-06-16 ENCOUNTER — Other Ambulatory Visit (HOSPITAL_BASED_OUTPATIENT_CLINIC_OR_DEPARTMENT_OTHER): Payer: Self-pay

## 2024-06-28 ENCOUNTER — Other Ambulatory Visit: Payer: Self-pay | Admitting: Family Medicine

## 2024-06-28 ENCOUNTER — Other Ambulatory Visit (HOSPITAL_BASED_OUTPATIENT_CLINIC_OR_DEPARTMENT_OTHER): Payer: Self-pay

## 2024-06-28 DIAGNOSIS — F339 Major depressive disorder, recurrent, unspecified: Secondary | ICD-10-CM

## 2024-06-28 MED ORDER — LIDOCAINE 5 % EX PTCH
1.0000 | MEDICATED_PATCH | Freq: Two times a day (BID) | CUTANEOUS | 1 refills | Status: DC | PRN
  Filled 2024-06-28: qty 30, 30d supply, fill #0
  Filled 2024-07-25: qty 30, 30d supply, fill #1

## 2024-06-28 MED ORDER — DULOXETINE HCL 30 MG PO CPEP
30.0000 mg | ORAL_CAPSULE | Freq: Every day | ORAL | 1 refills | Status: AC
  Filled 2024-06-28: qty 90, 90d supply, fill #0
  Filled 2024-07-25 – 2024-10-17 (×3): qty 90, 90d supply, fill #1

## 2024-06-30 ENCOUNTER — Other Ambulatory Visit (HOSPITAL_BASED_OUTPATIENT_CLINIC_OR_DEPARTMENT_OTHER): Payer: Self-pay

## 2024-07-03 ENCOUNTER — Other Ambulatory Visit (HOSPITAL_BASED_OUTPATIENT_CLINIC_OR_DEPARTMENT_OTHER): Payer: Self-pay

## 2024-07-17 ENCOUNTER — Other Ambulatory Visit (HOSPITAL_BASED_OUTPATIENT_CLINIC_OR_DEPARTMENT_OTHER): Payer: Self-pay

## 2024-07-17 ENCOUNTER — Other Ambulatory Visit: Payer: Self-pay | Admitting: Cardiology

## 2024-07-17 MED ORDER — EZETIMIBE 10 MG PO TABS
10.0000 mg | ORAL_TABLET | Freq: Every day | ORAL | 0 refills | Status: DC
  Filled 2024-07-17: qty 90, 90d supply, fill #0

## 2024-07-25 ENCOUNTER — Other Ambulatory Visit (HOSPITAL_BASED_OUTPATIENT_CLINIC_OR_DEPARTMENT_OTHER): Payer: Self-pay

## 2024-07-25 ENCOUNTER — Other Ambulatory Visit: Payer: Self-pay

## 2024-07-27 ENCOUNTER — Other Ambulatory Visit (HOSPITAL_BASED_OUTPATIENT_CLINIC_OR_DEPARTMENT_OTHER): Payer: Self-pay

## 2024-07-28 ENCOUNTER — Other Ambulatory Visit (HOSPITAL_BASED_OUTPATIENT_CLINIC_OR_DEPARTMENT_OTHER): Payer: Self-pay

## 2024-08-07 ENCOUNTER — Ambulatory Visit: Admitting: Internal Medicine

## 2024-08-16 ENCOUNTER — Ambulatory Visit: Admitting: Internal Medicine

## 2024-08-16 NOTE — Progress Notes (Deleted)
 Name: Kenneth [name]  MRN/ DOB: [id number], [birth year]   Age/ Sex: 68 y.o., male    PCP: Frann Mabel Mt, DO   Reason for Endocrinology Evaluation: Type 2 Diabetes Mellitus     Date of Initial Endocrinology Visit: 10/28/2021    PATIENT IDENTIFIER: Kenneth [name] is a 68 y.o. male with a past medical history of T2DM, CAD, CHF , HTN and ESRD on HD since 2017. The patient presented for initial endocrinology clinic visit on 10/28/2021 for consultative assistance with his diabetes management.    HPI: [name] transitioned care from Dr. Beryl . He is accompanied by his wife and interpreter    Diagnosed with DM years ago  > 20 yrs ago . Islet cell Ab negative 2021 Currently checking blood sugars multiple  x / day,  through freestyle libre   Hypoglycemia episodes : yes                Symptoms: shaking                  Frequency: 1/ week Hemoglobin A1c has ranged from 5.2% in 2022, peaking at 8.1% in 2021.   In terms of diet, the patient eats 2 meals a day. Snacks occasionally     Nephrology : Dr. Prescilla . Pt on HD  Monday, Wednesday and Friday  Pulmonary : Dr. Annella  10/2021 for dyspnea   He was trained on the Guardian sensor to our CDE 01/2023  SUBJECTIVE:   During the last visit (04/12/2024): A1c 6.4%       Today (08/16/24): Kenneth [name] is here for follow-up on diabetes management.  He is accompanied by his wife .  Interpretor line used [employer] # [id number]  He  checks his blood sugars multiple times daily, through [employer]. The patient has not had hypoglycemic episodes since the last clinic visit.   Denies nausea or vomiting recently  Denies constipation and no diarrhea  He is having issues with the battery cap of the pump , keeps falling off   This patient with type 2 diabetes is treated with Medtronic  (insulin  pump). During the visit the pump basal and bolus doses were reviewed including carb/insulin  rations and supplemental doses. The clinical list was  updated. The glucose meter download was reviewed in detail to determine if the current pump settings are providing the best glycemic control without excessive hypoglycemia.  Pump and meter download:  Pump   Medtronic 630 G Settings   Insulin  type   Lyumjev    Basal rate       0000 0.425  u/h    0600 0.425 u/h    1800 0.550u/h       I:C ratio       0000 1:12    Enter 40 grams TIDQAC              Sensitivity       0000  55      Goal       0000  110           Type & Model of Pump: medtronic  Insulin  Type: Currently using lyumjev    PUMP STATISTICS: Average BG: 118 -/+ 46 BG Readings: 2.7/ day Average Daily Carbs (g): 48 Average Total Daily Insulin : 16+/- 5.2 Average Daily Basal: 11.4 (71 %) Average Daily Bolus: 4.6 (29%)     CONTINUOUS GLUCOSE MONITORING RECORD INTERPRETATION    Dates of Recording: 7/17-7/30/2025  Sensor description:freestyle libre   Results statistics:  CGM use % of time 51  Average and SD 172/49.7  Time in range 59%  % Time Above 180 21  % Time above 250 16  % Time Below target 0    Glycemic patterns summary: BG's trend down overnight and fluctuate during the day Hyperglycemic episodes  postprandial   Hypoglycemic episodes occurred overnight  Overnight periods: Trends down to optimal    HOME DIABETES REGIMEN: Lyumjev       Statin: yes  ACE-I/ARB: no      DIABETIC COMPLICATIONS: Microvascular complications:  ESRD on HD , neuropathy, left eye DR Last eye exam: Completed 01/05/2024  Macrovascular complications:  CAD ( S/P CABG 2017) Denies: PVD, CVA   PAST HISTORY: Past Medical History:  Past Medical History:  Diagnosis Date   Arthritis    hands   CAD (coronary artery disease) 10/30/2016   NSTEMI 9/17 with CABG x 3 (LIMA-LAD, SVG-OM, SVG-RCA).   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   type 1 DM   Dyspnea    with exertion   ESRD on hemodialysis (HCC)    MWF; [place] [facility] (03/30/2018)   Gastroesophageal reflux disease 08/18/2016   Hyperlipidemia    Hypertension    Ischemic rest pain of lower extremity 02/06/2015   Keratoma 02/27/2015   Metatarsal deformity 02/27/2015   PAF (paroxysmal atrial fibrillation) (HCC)    Pronation deformity of both feet 02/27/2015   Past Surgical History:  Past Surgical History:  Procedure Laterality Date   A/V FISTULAGRAM Left 03/06/2021   Procedure: A/V FISTULAGRAM;  Surgeon: Gretta Lonni PARAS, MD;  [facility]: [facility];  Service: Cardiovascular;  Laterality: Left;   A/V FISTULAGRAM Left 06/02/2022   Procedure: A/V Fistulagram;  Surgeon: Serene Gaile ORN, MD;  Location: [facility];  Service: Cardiovascular;  Laterality: Left;   AV FISTULA PLACEMENT Left 06/22/2016   Procedure: ARTERIOVENOUS (AV) FISTULA CREATION LEFT UPPER ARM;  Surgeon: Krystal JULIANNA Doing, MD;  Location: [facility] OR;  Service: Vascular;  Laterality: Left;   BIOPSY  12/19/2020   Procedure: BIOPSY;  Surgeon: Aneita Gwendlyn DASEN, MD;  Location: [facility] ENDOSCOPY;  Service: Endoscopy;;   BIOPSY  12/15/2022   Procedure: BIOPSY;  Surgeon: Legrand Victory LITTIE DOUGLAS, MD;  Location: [facility];  Service: Gastroenterology;;   CARDIAC CATHETERIZATION N/A 05/31/2016   Procedure: Left Heart Cath and Coronary Angiography;  Surgeon: Candyce GORMAN Reek, MD;  Location: [facility] INVASIVE CV LAB;  Service: Cardiovascular;  Laterality: N/A;   CARDIAC CATHETERIZATION N/A 05/31/2016   Procedure: Right Heart Cath;  Surgeon: Candyce GORMAN Reek, MD;  Location: [facility] INVASIVE CV LAB;  Service: Cardiovascular;  Laterality: N/A;   CARDIAC CATHETERIZATION N/A 05/31/2016   Procedure: IABP Insertion;  Surgeon: Candyce GORMAN Reek, MD;  [facility]: [facility];  Service: Cardiovascular;  Laterality: N/A;   COLONOSCOPY WITH PROPOFOL  N/A 12/20/2020   Procedure: COLONOSCOPY WITH PROPOFOL ;   Surgeon: Shila Gustav GAILS, MD;  Location: [facility];  Service: Endoscopy;  Laterality: N/A;   COLONOSCOPY WITH PROPOFOL  N/A 12/15/2022   Procedure: COLONOSCOPY WITH PROPOFOL ;  Surgeon: Legrand Victory LITTIE DOUGLAS, MD;  Location: [facility] ENDOSCOPY;  Service: Gastroenterology;  Laterality: N/A;   CORONARY ARTERY BYPASS GRAFT N/A 06/05/2016   Procedure: CORONARY ARTERY BYPASS GRAFTING (CABG) x3 LIMA to LAD -SVG to OM -SVG to RCA;  Surgeon: Maude  Fleeta Ochoa, MD;  Location: [facility] OR;  Service: Open Heart Surgery;  Laterality: N/A;   CORONARY STENT INTERVENTION N/A 07/07/2017   Procedure: CORONARY STENT INTERVENTION;  Surgeon: Darron Deatrice LABOR, MD;  [facility];  Service: Cardiovascular;  Laterality: N/A;   CORONARY STENT INTERVENTION N/A 03/30/2018   Procedure: CORONARY STENT INTERVENTION;  Surgeon: Jordan, Peter M, MD;  Location: [facility] INVASIVE CV LAB;  Service: Cardiovascular;  Laterality: N/A;   ESOPHAGOGASTRODUODENOSCOPY (EGD) WITH PROPOFOL  N/A 12/19/2020   Procedure: ESOPHAGOGASTRODUODENOSCOPY (EGD) WITH PROPOFOL ;  Surgeon: Aneita Gwendlyn DASEN, MD;  Location: [facility] ENDOSCOPY;  Service: Endoscopy;  Laterality: N/A;   ESOPHAGOGASTRODUODENOSCOPY (EGD) WITH PROPOFOL  N/A 12/15/2022   Procedure: ESOPHAGOGASTRODUODENOSCOPY (EGD) WITH PROPOFOL ;  Surgeon: Legrand Victory LITTIE DOUGLAS, MD;  Location: [facility]COPY;  Service: Gastroenterology;  Laterality: N/A;   EXCHANGE OF A DIALYSIS CATHETER Right 04/16/2022   Procedure: EXCHANGE OF A DIALYSIS CATHETER;  Surgeon: Magda Debby SAILOR, MD;  Location: [facility] OR;  Service: Vascular;  Laterality: Right;   GIVENS CAPSULE STUDY N/A 12/20/2020   Procedure: GIVENS CAPSULE STUDY;  Surgeon: Shila Gustav GAILS, MD;  Location: [facility];  Service: Endoscopy;  Laterality: N/A;   HEMOSTASIS CLIP PLACEMENT  12/15/2022   Procedure: HEMOSTASIS CLIP PLACEMENT;  Surgeon: Legrand Victory LITTIE DOUGLAS, MD;  Location: [facility];  Service: Gastroenterology;;   INSERTION OF DIALYSIS CATHETER N/A 06/05/2016    Procedure: INSERTION OF DIALYSIS/trialysis CATHETER;  Surgeon: Maude Fleeta Ochoa, MD;  Location: [facility] OR;  Service: Vascular;  Laterality: N/A;   INSERTION OF DIALYSIS CATHETER Right 06/13/2016   Procedure: INSERTION OF DIALYSIS CATHETER RIGHT INTERNAL JUGULAR;  Surgeon: Redell LITTIE Door, MD;  Location: [facility] OR;  Service: Vascular;  Laterality: Right;   INSERTION OF DIALYSIS CATHETER Right 09/07/2019   Procedure: Insertion Of Dialysis Catheter;  Surgeon: Oris Krystal FALCON, MD;  Location: [facility] OR;  Service: Vascular;  Laterality: Right;   INSERTION OF DIALYSIS CATHETER N/A 04/09/2022   Procedure: INSERTION OF TUNNELED DIALYSIS CATHETER;  Surgeon: Magda Debby SAILOR, MD;  Location: [facility] OR;  Service: Vascular;  Laterality: N/A;   INTRAOPERATIVE TRANSESOPHAGEAL ECHOCARDIOGRAM N/A 06/05/2016   Procedure: INTRAOPERATIVE TRANSESOPHAGEAL ECHOCARDIOGRAM;  Surgeon: Maude Fleeta Ochoa, MD;  Location: [facility] OR;  Service: Open Heart Surgery;  Laterality: N/A;   LEFT HEART CATH AND CORS/GRAFTS ANGIOGRAPHY N/A 11/03/2016   Procedure: Left Heart Cath and Cors/Grafts Angiography;  Surgeon: Debby LABOR Sor, MD;  [facility];  Service: Cardiovascular;  Laterality: N/A;   LEFT HEART CATH AND CORS/GRAFTS ANGIOGRAPHY N/A 07/07/2017   Procedure: LEFT HEART CATH AND CORS/GRAFTS ANGIOGRAPHY;  Surgeon: Rolan Ezra RAMAN, MD;  Location: [facility] INVASIVE CV LAB;  Service: Cardiovascular;  Laterality: N/A;   LEFT HEART CATH AND CORS/GRAFTS ANGIOGRAPHY N/A 03/30/2018   Procedure: LEFT HEART CATH AND CORS/GRAFTS ANGIOGRAPHY;  Surgeon: Jordan, Peter M, MD;  Location: [facility] INVASIVE CV LAB;  Service: Cardiovascular;  Laterality: N/A;   LEFT HEART CATH AND CORS/GRAFTS ANGIOGRAPHY N/A 04/17/2019   Procedure: LEFT HEART CATH AND CORS/GRAFTS ANGIOGRAPHY;  Surgeon: Rolan Ezra RAMAN, MD;  Location: [facility] INVASIVE CV LAB;  Service: Cardiovascular;  Laterality: N/A;   LUMBAR LAMINECTOMY/DECOMPRESSION MICRODISCECTOMY Bilateral 09/01/2022   Procedure: Laminectomy and  Foraminotomy - bilateral - Lumbar three-Lumbar four;  Surgeon: Louis Victory, MD;  Location: [facility] OR;  Service: Neurosurgery;  Laterality: Bilateral;   PERIPHERAL VASCULAR BALLOON ANGIOPLASTY Left 06/02/2022   Procedure: PERIPHERAL VASCULAR BALLOON ANGIOPLASTY;  Surgeon: Serene Gaile ORN, MD;  [facility];  Service: Cardiovascular;  Laterality: Left;   PERIPHERAL VASCULAR INTERVENTION Left 03/06/2021   Procedure: PERIPHERAL VASCULAR INTERVENTION;  Surgeon: Gretta Lonni PARAS, MD;  [facility];  Service: Cardiovascular;  Laterality: Left;   POLYPECTOMY  12/20/2020   Procedure: POLYPECTOMY;  Surgeon: Shila Gustav GAILS, MD;  Location[facility];  Service: Endoscopy;;   POLYPECTOMY  12/15/2022   Procedure: POLYPECTOMY;  Surgeon: Legrand Victory LITTIE DOUGLAS, MD;  Location: [facility];  Service: Gastroenterology;;   REVISION OF ARTERIOVENOUS GORETEX GRAFT Left 07/27/2019   Procedure: PLICATION OF ANEURYSM OF ARTERIOVENOUS FISTULA  LEFT ARM;  Surgeon: Oris Krystal FALCON, MD;  Location: [facility] OR;  Service: Vascular;  Laterality: Left;   REVISON OF ARTERIOVENOUS FISTULA Left 09/07/2019   Procedure: REVISON OF LEFT UPPER ARM ARTERIOVENOUS FISTULA;  Surgeon: Oris Krystal FALCON, MD;  Location: [facility] OR;  Service: Vascular;  Laterality: Left;   REVISON OF ARTERIOVENOUS FISTULA Left 04/09/2022   Procedure: LEFT ARM FISTULA REVISION;  Surgeon: Magda Debby SAILOR, MD;  Location: [facility] OR;  Service: Vascular;  Laterality: Left;  PERIPHERAL NERVE BLOCK   REVISON OF ARTERIOVENOUS FISTULA Left 04/16/2022   Procedure: REVISON OF ARTERIOVENOUS FISTULA;  Surgeon: Magda Debby SAILOR, MD;  Location: [facility] OR;  Service: Vascular;  Laterality: Left;   RIGHT/LEFT HEART CATH AND CORONARY ANGIOGRAPHY N/A 01/03/2021   Procedure: RIGHT/LEFT HEART CATH AND CORONARY ANGIOGRAPHY;  Surgeon: Rolan Ezra RAMAN, MD;  Location: [facility] INVASIVE CV LAB;  Service: Cardiovascular;  Laterality: N/A;    Social History:  reports that he quit smoking  about 11 years ago. His smoking use included cigarettes. He started smoking about 46 years ago. He has a 52.5 pack-year smoking history. He has never been exposed to tobacco smoke. He has never used smokeless tobacco. He reports that he does not drink alcohol and does not use drugs. Family History:  Family History  Problem Relation Age of Onset   CAD Father    Colon cancer Father    Diabetes Brother    Sleep apnea Son      HOME MEDICATIONS: Allergies as of 08/16/2024   No Known Allergies      Medication List        Accurate as of August 16, 2024 12:17 PM. If you have any questions, ask your nurse or doctor.          ARANESP  (ALBUMIN  FREE) IJ Darbepoetin Alfa  (Aranesp )   atorvastatin  80 MG tablet Commonly known as: LIPITOR  Take 1 tablet (80 mg total) by mouth daily.   calcium  acetate 667 MG capsule Commonly known as: PHOSLO  Take 667 mg by mouth 3 (three) times daily with meals.   carvedilol  12.5 MG tablet Commonly known as: COREG  Take 1 tablet (12.5 mg total) by mouth 2 (two) times daily with a meal.   DULoxetine  30 MG capsule Commonly known as: Cymbalta  Take 1 capsule (30 mg total) by mouth daily.   Eliquis  5 MG Tabs tablet Generic drug: apixaban  Take 1 tablet (5 mg total) by mouth 2 (two) times daily.   ezetimibe  10 MG tablet Commonly known as: ZETIA  Take 1 tablet (10 mg total) by mouth daily. chuga yagmul lipil-eul wihae yagsog-eul yeyaghaseyo.   FreeStyle Libre 2 Sensor Misc 1 Device by Does not apply route every 14 (fourteen) days.   Guardian Sensor 3 Misc 1 Device by Does not apply route as directed.   Guardian Link 3 Transmitter Misc 1 Device by Does not apply route as directed.   HECTOROL  IV 2 mcg.  hydrALAZINE  50 MG tablet Commonly known as: APRESOLINE  Take 1.5 tablets (75 mg total) by mouth 3 (three) times daily. NEEDS FOLLOW UP APPOINTMENT FOR ANYMORE REFILLS   HYDROcodone -acetaminophen  10-325 MG tablet Commonly known as: NORCO Take 1  tablet by mouth every 4 to 6 hours as needed for pain.   isosorbide  mononitrate 60 MG 24 hr tablet Commonly known as: IMDUR  Take 1.5 tablets (90 mg total) by mouth daily. PLEASE SCHEDULE APPOINTMENT FOR MORE REFILLS   ketoconazole  2 % shampoo Commonly known as: NIZORAL  Apply 1 Application topically 2 (two) times a week.   lidocaine  5 % Commonly known as: LIDODERM  Place 1 patch onto the skin as directed. May wear up to12 hours. (12hr patch on, 12hr patch off)   Lokelma  10 g Pack packet Generic drug: sodium zirconium cyclosilicate  Take 1 packet dissolved in water as directed as directed Take on nondialysis days 3 times a week. Dissolve in 2-3 ounces of water   Lokelma  10 g Pack packet Generic drug: sodium zirconium cyclosilicate  Take 1 packet (10 grams total) dissolved in water by mouth daily.   Lokelma  10 g Pack packet Generic drug: sodium zirconium cyclosilicate  Take 1 packet (10 grams total) dissolved in water by mouth daily.   Lokelma  10 g Pack packet Generic drug: sodium zirconium cyclosilicate  Take 1 packet (10 g) dissolved in water once a day.   Lyumjev  100 UNIT/ML Soln Generic drug: Insulin  Lispro-aabc Max Daily 30 units per insulin  pump   MIRCERA IJ Inject as directed.   OneTouch Verio test strip Generic drug: glucose blood 1 each by Other route in the morning, at noon, in the evening, and at bedtime. Use as instructed   prednisoLONE  acetate 1 % ophthalmic suspension Commonly known as: PRED FORTE  Place 1 drop into the left eye 4 (four) times daily.   SENSIPAR  PO Take 60 mg by mouth.   Stiolto Respimat  2.5-2.5 MCG/ACT Aers Generic drug: Tiotropium Bromide-Olodaterol Inhale 2 puffs by mouth into the lungs daily.   TechLite Pen Needles 32G X 6 MM Misc Generic drug: Insulin  Pen Needle Use as directed 3 times daily   triamcinolone  cream 0.1 % Commonly known as: KENALOG  Apply 1 Application topically 2 (two) times daily.   Voltaren  1 % Gel Generic drug:  diclofenac  Sodium Apply 4 g topically.   ZOFRAN  PO 4 mg.         ALLERGIES: No Known Allergies   REVIEW OF SYSTEMS: A comprehensive ROS was conducted with the patient and is negative except as per HPI     OBJECTIVE:   VITAL SIGNS: There were no vitals taken for this visit.   PHYSICAL EXAM:  General: Pt appears well and is in NAD  Lungs: Clear with good BS bilat   Heart: RRR   Extremities:  Lower extremities - No pretibial edema.  Neuro: MS is good with appropriate affect, pt is alert and Ox3    DM foot exam: 01/12/2024  The skin of the feet is intact without sores or ulcerations. The pedal pulses are 1+ on right and 1+ on left. The sensation is decrease to a screening 5.07, 10 gram monofilament bilaterally      DATA REVIEWED:  Lab Results  Component Value Date   HGBA1C 6.4 (A) 04/12/2024   HGBA1C 8.7 (A) 01/12/2024   HGBA1C 8.5 (A) 10/08/2023    Latest Reference Range & Units 06/02/24 09:38  Sodium 135 - 145 mEq/L 137  Potassium 3.5 - 5.1 mEq/L 4.4  Chloride 96 - 112 mEq/L  92 (L)  CO2 19 - 32 mEq/L 35 (H)  Glucose 70 - 99 mg/dL 860 (H)  BUN 6 - 23 mg/dL 17  Creatinine 9.59 - 8.49 mg/dL 4.25 (HH)  Calcium  8.4 - 10.5 mg/dL 8.8  Alkaline Phosphatase 39 - 117 U/L 115  Albumin  3.5 - 5.2 g/dL 4.7  AST 0 - 37 U/L 23  ALT 0 - 53 U/L 20  Total Protein 6.0 - 8.3 g/dL 7.7    Latest Reference Range & Units 06/02/24 09:38  Total CHOL/HDL Ratio  4  Cholesterol 0 - 200 mg/dL 856  HDL Cholesterol >60.99 mg/dL 66.19 (L)  LDL (calc) 0 - 99 mg/dL 73  NonHDL  890.95  Triglycerides 0.0 - 149.0 mg/dL 821.9 (H)  VLDL 0.0 - 59.9 mg/dL 64.3     ASSESSMENT / PLAN / RECOMMENDATIONS:   1) Type 2 Diabetes Mellitus, optimally controlled, With Neuropathic, retinopathic ,ESRD on HD and macrovascular  complications - Most recent A1c of 6.4 %. Goal A1c < 7.0 %.     - A1c skewed due to ESRD , but this is the lowest that his A1c has been, this is most likely due to the  fact that he is finally bolusing for at least the largest meal of the day, I did encourage the patient to continue to bolus with each meal and to enter carbohydrates -The patient has been noted with hypoglycemia overnight will decrease basal rate as below -Barriers to diabetes self-care is language barrier -He has not been able to get the Guardian sensor due to language barriers, as the company attempted to contact him multiple times but they do not hear from the patient - The patient was advised to contact Medtr[employer] to explain the battery Falling off, as he will probably need a new pump or a replacement  - MEDICATIONS:     Pump   Medtronic 630 G Settings   Insulin  type   Lyumjev    Basal rate       0000 0.425 u/h    0600 0.425 u/h    1800 0.550u/h       I:C ratio       0000 1:12    Enter 40 grams TIDQAC              Sensitivity       0000  55      Goal       0000  110         EDUCATION / INSTRUCTIONS: BG monitoring instructions: Patient is instructed to check his blood sugars 3 times a day, before meals . Call [facility] if: BG persistently < 70  I reviewed the Rule of 15 for the treatment of hypoglycemia in detail with the patient. Literature supplied.   2) Diabetic complications:  Eye: Does not have known diabetic retinopathy.  Neuro/ Feet: Does  have known diabetic peripheral neuropathy. Renal: Patient does  have known baseline CKD. He is  on an ACEI/ARB at present.    Follow-up in 4 months    Signed electronically by: Stefano Redgie Butts, MD  [facility] Endocrinology  [facility] [address] [place], [place] [address] Phone: [phone] FAX: [phone]   CC: Frann Mabel Mt, DO [address] [place] [address] Phone: [phone]  Fax: [phone]    Return to Endocrinology clinic as below: Future Appointments  Date Time Provider Department Center  08/16/2024  1:20 PM  Lelia Jons, Donell Redgie, MD  [facility] None  12/01/2024  1:30 PM Frann Mabel Mt, DO [facility] [id number]

## 2024-08-18 ENCOUNTER — Telehealth: Payer: Self-pay | Admitting: Internal Medicine

## 2024-08-18 ENCOUNTER — Encounter: Payer: Self-pay | Admitting: Internal Medicine

## 2024-08-18 ENCOUNTER — Ambulatory Visit: Admitting: Internal Medicine

## 2024-08-18 ENCOUNTER — Telehealth: Payer: Self-pay

## 2024-08-18 VITALS — BP 150/60 | Ht 65.0 in | Wt 130.0 lb

## 2024-08-18 DIAGNOSIS — N186 End stage renal disease: Secondary | ICD-10-CM | POA: Diagnosis not present

## 2024-08-18 DIAGNOSIS — E1165 Type 2 diabetes mellitus with hyperglycemia: Secondary | ICD-10-CM

## 2024-08-18 DIAGNOSIS — E1142 Type 2 diabetes mellitus with diabetic polyneuropathy: Secondary | ICD-10-CM

## 2024-08-18 DIAGNOSIS — E1122 Type 2 diabetes mellitus with diabetic chronic kidney disease: Secondary | ICD-10-CM | POA: Diagnosis not present

## 2024-08-18 DIAGNOSIS — Z794 Long term (current) use of insulin: Secondary | ICD-10-CM

## 2024-08-18 DIAGNOSIS — Z992 Dependence on renal dialysis: Secondary | ICD-10-CM

## 2024-08-18 LAB — POCT GLYCOSYLATED HEMOGLOBIN (HGB A1C): Hemoglobin A1C: 10.8 % — AB (ref 4.0–5.6)

## 2024-08-18 NOTE — Patient Instructions (Addendum)
 Tandem Representative [phone]      HOW TO TREAT LOW BLOOD SUGARS (Blood sugar LESS THAN 70 MG/DL) Please follow the RULE OF 15 for the treatment of hypoglycemia treatment (when your (blood sugars are less than 70 mg/dL)   STEP 1: Take 15 grams of carbohydrates when your blood sugar is low, which includes:  3-4 GLUCOSE TABS  OR 3-4 OZ OF JUICE OR REGULAR SODA OR ONE TUBE OF GLUCOSE GEL    STEP 2: RECHECK blood sugar in 15 MINUTES STEP 3: If your blood sugar is still low at the 15 minute recheck --> then, go back to STEP 1 and treat AGAIN with another 15 grams of carbohydrates.

## 2024-08-18 NOTE — Telephone Encounter (Signed)
 Can you please order a tandem pump for this patient, please let us  [employer] (the rep) know that this patient has a language barrier, I am not sure if they have interpreter services or he may just come to our office and use the interpreter services here    Chris's number is [phone]  The patient is currently on the Medtronic pump   Thanks

## 2024-08-18 NOTE — Telephone Encounter (Signed)
 Parachute order placed:  Infusion Cartridges/Reservoirs [MEDICARE] Supplier [facility] (Diabetes Internal Use) Items Total Infusion Cartridge, Medtronic 7 Series, Change every 3 days, Set (10) [facility] INFC 3 Prescription details Day Supply 90  Infusion Sets [MEDICARE] Supplier [facility] (Diabetes Internal Use) Items Total Infusion Set, Quick-Set, 6mm cannula, 23 tubing, [employer]igm, To be used with Medtronic pumps only, Change every 3 days, Set (10) [facility] INFS 3 Prescription details Day Supply 90

## 2024-08-18 NOTE — Telephone Encounter (Signed)
 Spoke with [employer].  Demographics faxed to him at [phone] but [employer] does not think patient will qualify for device due to elevated C-peptide

## 2024-08-18 NOTE — Progress Notes (Signed)
 Name: Kenneth [name]  MRN/ DOB: [id number], 1956-03-15   Age/ Sex: 68 y.o., male    PCP: Frann Mabel Mt, DO   Reason for Endocrinology Evaluation: Type 2 Diabetes Mellitus     Date of Initial Endocrinology Visit: 10/28/2021    PATIENT IDENTIFIER: Kenneth [name] is a 68 y.o. male with a past medical history of T2DM, CAD, CHF , HTN and ESRD on HD since 2017. The patient presented for initial endocrinology clinic visit on 10/28/2021 for consultative assistance with his diabetes management.    HPI: Kenneth [name] transitioned care from Dr. Beryl . He is accompanied by his wife and interpreter    Diagnosed with DM years ago  > 20 yrs ago . Islet cell Ab negative 2021 Currently checking blood sugars multiple  x / day,  through freestyle libre   Hypoglycemia episodes : yes                Symptoms: shaking                  Frequency: 1/ week Hemoglobin A1c has ranged from 5.2% in 2022, peaking at 8.1% in 2021.   In terms of diet, the patient eats 2 meals a day. Snacks occasionally     Nephrology : Dr. Prescilla . Pt on HD  Monday, Wednesday and Friday  Pulmonary : Dr. Annella  10/2021 for dyspnea   He was trained on the Guardian sensor to our CDE 01/2023  SUBJECTIVE:   During the last visit (04/12/2024): A1c 6.4%       Today (08/18/24): Kenneth [name] is here for follow-up on diabetes management.  He is accompanied by his wife .  Interpretor line used [employer] # [id number]  He  checks his blood sugars multiple times daily, through [employer]. The patient has not had hypoglycemic episodes since the last clinic visit.   He has been eating a lot of candy without bolusing for it   HD days TWF  No nausea  Has occasoinal  constipation but no diarrhea   This patient with type 2 diabetes is treated with Medtronic  (insulin  pump). During the visit the pump basal and bolus doses were reviewed including carb/insulin  rations and supplemental doses. The clinical list was updated. The glucose  meter download was reviewed in detail to determine if the current pump settings are providing the best glycemic control without excessive hypoglycemia.  Pump and meter download:  Pump   Medtronic 630 G Settings   Insulin  type   Lyumjev    Basal rate       0000 0.425  u/h    0600 0.425 u/h    1800 0.575 u/h       I:C ratio       0000 1:12    Enter 40 grams TIDQAC              Sensitivity       0000  55      Goal       0000  110           Type & Model of Pump: medtronic  Insulin  Type: Currently using lyumjev    PUMP STATISTICS: Average BG: 155 -/+ 64 BG Readings: 1.2/ day Average Daily Carbs (g): 52 Average Total Daily Insulin : 16.6+/- 5.8 Average Daily Basal: 11.1 (67 %) Average Daily Bolus: 5.5 (33%)     CONTINUOUS GLUCOSE MONITORING RECORD INTERPRETATION    Dates of Recording: 11/22-12/01/2024  Sensor description:freestyle [employer]  Results statistics:   CGM use % of time 27  Average and SD 323/36.5  Time in range 15%  % Time Above 180 16  % Time above 250 69  % Time Below target 0    Glycemic patterns summary: BG's are high all day and night  Hyperglycemic episodes  all day and night   Hypoglycemic episodes occurred high   Overnight periods: high     HOME DIABETES REGIMEN: Lyumjev       Statin: yes  ACE-I/ARB: no      DIABETIC COMPLICATIONS: Microvascular complications:  ESRD on HD , neuropathy, left eye DR Last eye exam: Completed 01/05/2024  Macrovascular complications:  CAD ( S/P CABG 2017) Denies: PVD, CVA   PAST HISTORY: Past Medical History:  Past Medical History:  Diagnosis Date   Arthritis    hands   CAD (coronary artery disease) 10/30/2016   NSTEMI 9/17 with CABG x 3 (LIMA-LAD, SVG-OM, SVG-RCA).   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   type 1 DM   Dyspnea    with  exertion   ESRD on hemodialysis (HCC)    MWF; [place] (03/30/2018)   Gastroesophageal reflux disease 08/18/2016   Hyperlipidemia    Hypertension    Ischemic rest pain of lower extremity 02/06/2015   Keratoma 02/27/2015   Metatarsal deformity 02/27/2015   PAF (paroxysmal atrial fibrillation) (HCC)    Pronation deformity of both feet 02/27/2015   Past Surgical History:  Past Surgical History:  Procedure Laterality Date   A/V FISTULAGRAM Left 03/06/2021   Procedure: A/V FISTULAGRAM;  Surgeon: Gretta Lonni PARAS, MD;  [facility]: [facility];  Service: Cardiovascular;  Laterality: Left;   A/V FISTULAGRAM Left 06/02/2022   Procedure: A/V Fistulagram;  Surgeon: Serene Gaile ORN, MD;  Location: [facility];  Service: Cardiovascular;  Laterality: Left;   AV FISTULA PLACEMENT Left 06/22/2016   Procedure: ARTERIOVENOUS (AV) FISTULA CREATION LEFT UPPER ARM;  Surgeon: Krystal JULIANNA Doing, MD;  Location: [facility] OR;  Service: Vascular;  Laterality: Left;   BIOPSY  12/19/2020   Procedure: BIOPSY;  Surgeon: Aneita Gwendlyn DASEN, MD;  Location: [facility] ENDOSCOPY;  Service: Endoscopy;;   BIOPSY  12/15/2022   Procedure: BIOPSY;  Surgeon: Legrand Victory LITTIE DOUGLAS, MD;  Location: [facility];  Service: Gastroenterology;;   CARDIAC CATHETERIZATION N/A 05/31/2016   Procedure: Left Heart Cath and Coronary Angiography;  Surgeon: Candyce GORMAN Reek, MD;  Location: [facility] INVASIVE CV LAB;  Service: Cardiovascular;  Laterality: N/A;   CARDIAC CATHETERIZATION N/A 05/31/2016   Procedure: Right Heart Cath;  Surgeon: Candyce GORMAN Reek, MD;  Location: [facility] INVASIVE CV LAB;  Service: Cardiovascular;  Laterality: N/A;   CARDIAC CATHETERIZATION N/A 05/31/2016   Procedure: IABP Insertion;  Surgeon: Candyce GORMAN Reek, MD;  [facility]: [facility];  Service: Cardiovascular;  Laterality: N/A;   COLONOSCOPY WITH PROPOFOL  N/A 12/20/2020   Procedure: COLONOSCOPY WITH PROPOFOL ;  Surgeon: Shila Gustav GAILS, MD;  Location: [facility];   Service: Endoscopy;  Laterality: N/A;   COLONOSCOPY WITH PROPOFOL  N/A 12/15/2022   Procedure: COLONOSCOPY WITH PROPOFOL ;  Surgeon: Legrand Victory LITTIE DOUGLAS, MD;  Location: [facility] ENDOSCOPY;  Service: Gastroenterology;  Laterality: N/A;   CORONARY ARTERY BYPASS GRAFT N/A 06/05/2016   Procedure: CORONARY ARTERY BYPASS GRAFTING (CABG) x3 LIMA to LAD -SVG to OM -SVG  to RCA;  Surgeon: Maude Fleeta Ochoa, MD;  Location: [facility] OR;  Service: Open Heart Surgery;  Laterality: N/A;   CORONARY STENT INTERVENTION N/A 07/07/2017   Procedure: CORONARY STENT INTERVENTION;  Surgeon: Darron Deatrice LABOR, MD;  [facility];  Service: Cardiovascular;  Laterality: N/A;   CORONARY STENT INTERVENTION N/A 03/30/2018   Procedure: CORONARY STENT INTERVENTION;  Surgeon: Jordan, Peter M, MD;  Location: [facility] INVASIVE CV LAB;  Service: Cardiovascular;  Laterality: N/A;   ESOPHAGOGASTRODUODENOSCOPY (EGD) WITH PROPOFOL  N/A 12/19/2020   Procedure: ESOPHAGOGASTRODUODENOSCOPY (EGD) WITH PROPOFOL ;  Surgeon: Aneita Gwendlyn DASEN, MD;  Location: [facility] ENDOSCOPY;  Service: Endoscopy;  Laterality: N/A;   ESOPHAGOGASTRODUODENOSCOPY (EGD) WITH PROPOFOL  N/A 12/15/2022   Procedure: ESOPHAGOGASTRODUODENOSCOPY (EGD) WITH PROPOFOL ;  Surgeon: Legrand Victory LITTIE DOUGLAS, MD;  Location: [facility] ENDOSCOPY;  Service: Gastroenterology;  Laterality: N/A;   EXCHANGE OF A DIALYSIS CATHETER Right 04/16/2022   Procedure: EXCHANGE OF A DIALYSIS CATHETER;  Surgeon: Magda Debby SAILOR, MD;  Location: [facility] OR;  Service: Vascular;  Laterality: Right;   GIVENS CAPSULE STUDY N/A 12/20/2020   Procedure: GIVENS CAPSULE STUDY;  Surgeon: Shila Gustav GAILS, MD;  Location: [facility] ENDOS[facility];  Service: Endoscopy;  Laterality: N/A;   HEMOSTASIS CLIP PLACEMENT  12/15/2022   Procedure: HEMOSTASIS CLIP PLACEMENT;  Surgeon: Legrand Victory LITTIE DOUGLAS, MD;  Location: [facility];  Service: Gastroenterology;;   INSERTION OF DIALYSIS CATHETER N/A 06/05/2016   Procedure: INSERTION OF DIALYSIS/trialysis CATHETER;  Surgeon: Maude Fleeta Ochoa, MD;  Location: [facility] OR;  Service: Vascular;  Laterality: N/A;   INSERTION OF DIALYSIS CATHETER Right 06/13/2016   Procedure: INSERTION OF DIALYSIS CATHETER RIGHT INTERNAL JUGULAR;  Surgeon: Redell LITTIE Door, MD;  Location: [facility] OR;  Service: Vascular;  Laterality: Right;   INSERTION OF DIALYSIS CATHETER Right 09/07/2019   Procedure: Insertion Of Dialysis Catheter;  Surgeon: Oris Krystal FALCON, MD;  Location: [facility] OR;  Service: Vascular;  Laterality: Right;   INSERTION OF DIALYSIS CATHETER N/A 04/09/2022   Procedure: INSERTION OF TUNNELED DIALYSIS CATHETER;  Surgeon: Magda Debby SAILOR, MD;  Location: [facility] OR;  Service: Vascular;  Laterality: N/A;   INTRAOPERATIVE TRANSESOPHAGEAL ECHOCARDIOGRAM N/A 06/05/2016   Procedure: INTRAOPERATIVE TRANSESOPHAGEAL ECHOCARDIOGRAM;  Surgeon: Maude Fleeta Ochoa, MD;  Location: [facility] OR;  Service: Open Heart Surgery;  Laterality: N/A;   LEFT HEART CATH AND CORS/GRAFTS ANGIOGRAPHY N/A 11/03/2016   Procedure: Left Heart Cath and Cors/Grafts Angiography;  Surgeon: Debby LABOR Sor, MD;  [facility];  Service: Cardiovascular;  Laterality: N/A;   LEFT HEART CATH AND CORS/GRAFTS ANGIOGRAPHY N/A 07/07/2017   Procedure: LEFT HEART CATH AND CORS/GRAFTS ANGIOGRAPHY;  Surgeon: Rolan Ezra RAMAN, MD;  Location: [facility] INVASIVE CV LAB;  Service: Cardiovascular;  Laterality: N/A;   LEFT HEART CATH AND CORS/GRAFTS ANGIOGRAPHY N/A 03/30/2018   Procedure: LEFT HEART CATH AND CORS/GRAFTS ANGIOGRAPHY;  Surgeon: Jordan, Peter M, MD;  Location: [facility] INVASIVE CV LAB;  Service: Cardiovascular;  Laterality: N/A;   LEFT HEART CATH AND CORS/GRAFTS ANGIOGRAPHY N/A 04/17/2019   Procedure: LEFT HEART CATH AND CORS/GRAFTS ANGIOGRAPHY;  Surgeon: Rolan Ezra RAMAN, MD;  Location: [facility] INVASIVE CV LAB;  Service: Cardiovascular;  Laterality: N/A;   LUMBAR LAMINECTOMY/DECOMPRESSION MICRODISCECTOMY Bilateral 09/01/2022   Procedure: Laminectomy and Foraminotomy - bilateral - Lumbar three-Lumbar four;  Surgeon: Louis Victory, MD;  Location: [facility] OR;  Service: Neurosurgery;  Laterality: Bilateral;   PERIPHERAL VASCULAR BALLOON ANGIOPLASTY Left 06/02/2022   Procedure: PERIPHERAL VASCULAR BALLOON ANGIOPLASTY;  Surgeon: Serene Gaile ORN, MD;  Location:  [facility];  Service: Cardiovascular;  Laterality: Left;   PERIPHERAL VASCULAR INTERVENTION Left 03/06/2021   Procedure: PERIPHERAL VASCULAR INTERVENTION;  Surgeon: Gretta Lonni PARAS, MD;  [facility];  Service: Cardiovascular;  Laterality: Left;   POLYPECTOMY  12/20/2020   Procedure: POLYPECTOMY;  Surgeon: Shila Gustav GAILS, MD;  Location[facility] ENDOS[facility];  Service: Endoscopy;;   POLYPECTOMY  12/15/2022   Procedure: POLYPECTOMY;  Surgeon: Legrand Victory LITTIE DOUGLAS, MD;  Location: [facility];  Service: Gastroenterology;;   REVISION OF ARTERIOVENOUS GORETEX GRAFT Left 07/27/2019   Procedure: PLICATION OF ANEURYSM OF ARTERIOVENOUS FISTULA  LEFT ARM;  Surgeon: Oris Krystal FALCON, MD;  Location: [facility] OR;  Service: Vascular;  Laterality: Left;   REVISON OF ARTERIOVENOUS FISTULA Left 09/07/2019   Procedure: REVISON OF LEFT UPPER ARM ARTERIOVENOUS FISTULA;  Surgeon: Oris Krystal FALCON, MD;  Location: [facility] OR;  Service: Vascular;  Laterality: Left;   REVISON OF ARTERIOVENOUS FISTULA Left 04/09/2022   Procedure: LEFT ARM FISTULA REVISION;  Surgeon: Magda Debby SAILOR, MD;  Location: [facility] OR;  Service: Vascular;  Laterality: Left;  PERIPHERAL NERVE BLOCK   REVISON OF ARTERIOVENOUS FISTULA Left 04/16/2022   Procedure: REVISON OF ARTERIOVENOUS FISTULA;  Surgeon: Magda Debby SAILOR, MD;  Location: [facility] OR;  Service: Vascular;  Laterality: Left;   RIGHT/LEFT HEART CATH AND CORONARY ANGIOGRAPHY N/A 01/03/2021   Procedure: RIGHT/LEFT HEART CATH AND CORONARY ANGIOGRAPHY;  Surgeon: Rolan Ezra RAMAN, MD;  Location: [facility] INVASIVE CV LAB;  Service: Cardiovascular;  Laterality: N/A;    Social History:  reports that he quit smoking about 11 years ago. His smoking use included cigarettes. He started  smoking about 46 years ago. He has a 52.5 pack-year smoking history. He has never been exposed to tobacco smoke. He has never used smokeless tobacco. He reports that he does not drink alcohol and does not use drugs. Family History:  Family History  Problem Relation Age of Onset   CAD Father    Colon cancer Father    Diabetes Brother    Sleep apnea Son      HOME MEDICATIONS: Allergies as of 08/18/2024   No Known Allergies      Medication List        Accurate as of August 18, 2024  8:24 AM. If you have any questions, ask your nurse or doctor.          ARANESP  (ALBUMIN  FREE) IJ Darbepoetin Alfa  (Aranesp )   atorvastatin  80 MG tablet Commonly known as: LIPITOR  Take 1 tablet (80 mg total) by mouth daily.   calcium  acetate 667 MG capsule Commonly known as: PHOSLO  Take 667 mg by mouth 3 (three) times daily with meals.   carvedilol  12.5 MG tablet Commonly known as: COREG  Take 1 tablet (12.5 mg total) by mouth 2 (two) times daily with a meal.   DULoxetine  30 MG capsule Commonly known as: Cymbalta  Take 1 capsule (30 mg total) by mouth daily.   Eliquis  5 MG Tabs tablet Generic drug: apixaban  Take 1 tablet (5 mg total) by mouth 2 (two) times daily.   ezetimibe  10 MG tablet Commonly known as: ZETIA  Take 1 tablet (10 mg total) by mouth daily. chuga yagmul lipil-eul wihae yagsog-eul yeyaghaseyo.   FreeStyle Libre 2 Sensor Misc 1 Device by Does not apply route every 14 (fourteen) days.   Guardian Sensor 3 Misc 1 Device by Does not apply route as directed.   Guardian Link 3 Transmitter Misc 1 Device by Does not apply route as directed.  HECTOROL  IV 2 mcg.   hydrALAZINE  50 MG tablet Commonly known as: APRESOLINE  Take 1.5 tablets (75 mg total) by mouth 3 (three) times daily. NEEDS FOLLOW UP APPOINTMENT FOR ANYMORE REFILLS   HYDROcodone -acetaminophen  10-325 MG tablet Commonly known as: NORCO Take 1 tablet by mouth every 4 to 6 hours as needed for pain.    isosorbide  mononitrate 60 MG 24 hr tablet Commonly known as: IMDUR  Take 1.5 tablets (90 mg total) by mouth daily. PLEASE SCHEDULE APPOINTMENT FOR MORE REFILLS   ketoconazole  2 % shampoo Commonly known as: NIZORAL  Apply 1 Application topically 2 (two) times a week.   lidocaine  5 % Commonly known as: LIDODERM  Place 1 patch onto the skin as directed. May wear up to12 hours. (12hr patch on, 12hr patch off)   Lokelma  10 g Pack packet Generic drug: sodium zirconium cyclosilicate  Take 1 packet dissolved in water as directed as directed Take on nondialysis days 3 times a week. Dissolve in 2-3 ounces of water   Lokelma  10 g Pack packet Generic drug: sodium zirconium cyclosilicate  Take 1 packet (10 grams total) dissolved in water by mouth daily.   Lokelma  10 g Pack packet Generic drug: sodium zirconium cyclosilicate  Take 1 packet (10 grams total) dissolved in water by mouth daily.   Lokelma  10 g Pack packet Generic drug: sodium zirconium cyclosilicate  Take 1 packet (10 g) dissolved in water once a day.   Lyumjev  100 UNIT/ML Soln Generic drug: Insulin  Lispro-aabc Max Daily 30 units per insulin  pump   MIRCERA IJ Inject as directed.   OneTouch Verio test strip Generic drug: glucose blood 1 each by Other route in the morning, at noon, in the evening, and at bedtime. Use as instructed   prednisoLONE  acetate 1 % ophthalmic suspension Commonly known as: PRED FORTE  Place 1 drop into the left eye 4 (four) times daily.   SENSIPAR  PO Take 60 mg by mouth.   Stiolto Respimat  2.5-2.5 MCG/ACT Aers Generic drug: Tiotropium Bromide-Olodaterol Inhale 2 puffs by mouth into the lungs daily.   TechLite Pen Needles 32G X 6 MM Misc Generic drug: Insulin  Pen Needle Use as directed 3 times daily   triamcinolone  cream 0.1 % Commonly known as: KENALOG  Apply 1 Application topically 2 (two) times daily.   Voltaren  1 % Gel Generic drug: diclofenac  Sodium Apply 4 g topically.   ZOFRAN  PO 4  mg.         ALLERGIES: No Known Allergies   REVIEW OF SYSTEMS: A comprehensive ROS was conducted with the patient and is negative except as per HPI     OBJECTIVE:   VITAL SIGNS: BP (!) 150/60   Ht 5' 5 (1.651 m)   Wt 130 lb (59 kg)   BMI 21.63 kg/m    PHYSICAL EXAM:  General: Pt appears well and is in NAD  Lungs: Clear with good BS bilat   Heart: RRR   Extremities:  Lower extremities - No pretibial edema.  Neuro: MS is good with appropriate affect, pt is alert and Ox3    DM foot exam: 01/12/2024  The skin of the feet is intact without sores or ulcerations. The pedal pulses are 1+ on right and 1+ on left. The sensation is decrease to a screening 5.07, 10 gram monofilament bilaterally      DATA REVIEWED:  Lab Results  Component Value Date   HGBA1C 10.8 (A) 08/18/2024   HGBA1C 6.4 (A) 04/12/2024   HGBA1C 8.7 (A) 01/12/2024    Latest Reference Range & Units 06/02/24  09:38  Sodium 135 - 145 mEq/L 137  Potassium 3.5 - 5.1 mEq/L 4.4  Chloride 96 - 112 mEq/L 92 (L)  CO2 19 - 32 mEq/L 35 (H)  Glucose 70 - 99 mg/dL 860 (H)  BUN 6 - 23 mg/dL 17  Creatinine 9.59 - 8.49 mg/dL 4.25 (HH)  Calcium  8.4 - 10.5 mg/dL 8.8  Alkaline Phosphatase 39 - 117 U/L 115  Albumin  3.5 - 5.2 g/dL 4.7  AST 0 - 37 U/L 23  ALT 0 - 53 U/L 20  Total Protein 6.0 - 8.3 g/dL 7.7    Latest Reference Range & Units 06/02/24 09:38  Total CHOL/HDL Ratio  4  Cholesterol 0 - 200 mg/dL 856  HDL Cholesterol >60.99 mg/dL 66.19 (L)  LDL (calc) 0 - 99 mg/dL 73  NonHDL  890.95  Triglycerides 0.0 - 149.0 mg/dL 821.9 (H)  VLDL 0.0 - 59.9 mg/dL 64.3     ASSESSMENT / PLAN / RECOMMENDATIONS:   1) Type 2 Diabetes Mellitus, Poorly  controlled, With Neuropathic, retinopathic ,ESRD on HD and macrovascular  complications - Most recent A1c of 10.8%. Goal A1c < 7.0 %.     - Patient has been noting worsening glycemic control and severe hyperglycemia.  The patient does admit to eating a lot of candy,  he was not bolusing for snacks/candy, nor has he been bolusing for meals as he has been advised on multiple occasions. - I did encourage the patient to continue to work on optimizing his glucose control to prevent blindness and amputation, unfortunately he is already on hemodialysis due to ESRD -I have attempted in the past to obtain Guardian sensor to work with his Medtronic pump, but it appears that the company had difficulty reaching them due to language barrier. -He continues to have pump errors due to a failing battery, I did ask the patient to contact [employer]tr[employer] again but in the meantime I would like for him to obtain a tandem pump, he has been using freestyle libre with no issues, and the tandem pump with link to freestyle libre -Barriers to diabetes self-care is language barrier -He was provided with tandem company phone number to contact them, a message was sent to my team to contact the rep to facilitate this process -No changes to the pump settings, he was again advised to enter 40 g with meals    - MEDICATIONS:     Pump   Medtronic 630 G Settings   Insulin  type   Lyumjev    Basal rate       0000 0.425 u/h    0600 0.425 u/h    1800 0.550u/h       I:C ratio       0000 1:12    Enter 40 grams TIDQAC              Sensitivity       0000  55      Goal       0000  110         EDUCATION / INSTRUCTIONS: BG monitoring instructions: Patient is instructed to check his blood sugars 3 times a day, before meals . Call [facility] if: BG persistently < 70  I reviewed the Rule of 15 for the treatment of hypoglycemia in detail with the patient. Literature supplied.   2) Diabetic complications:  Eye: Does not have known diabetic retinopathy.  Neuro/ Feet: Does  have known diabetic peripheral neuropathy. Renal: Patient does  have known baseline CKD. He  is  on an ACEI/ARB at present.    Follow-up in 3 months    Signed electronically by: Stefano Redgie Butts, MD  [facility] Endocrinology  [facility] [address] [place], [place] [address] Phone: [phone] FAX: [phone]   CC: Frann Mabel Mt, DO [address] [place] [address] Phone: [phone]  Fax: [phone]    Return to Endocrinology clinic as below: Future Appointments  Date Time Provider Department Center  08/18/2024  8:30 AM Stacie Templin, Donell Redgie, MD [facility] None  12/01/2024  1:30 PM Frann Mabel Mt, DO [facility] [id number]

## 2024-08-23 ENCOUNTER — Other Ambulatory Visit (HOSPITAL_BASED_OUTPATIENT_CLINIC_OR_DEPARTMENT_OTHER): Payer: Self-pay

## 2024-08-24 ENCOUNTER — Other Ambulatory Visit (HOSPITAL_BASED_OUTPATIENT_CLINIC_OR_DEPARTMENT_OTHER): Payer: Self-pay

## 2024-08-31 ENCOUNTER — Telehealth: Payer: Self-pay

## 2024-08-31 NOTE — Telephone Encounter (Signed)
 Chart notes uploaded in parachute as requested from [employer] rep.

## 2024-09-26 ENCOUNTER — Other Ambulatory Visit: Payer: Self-pay | Admitting: Family Medicine

## 2024-09-26 ENCOUNTER — Other Ambulatory Visit (HOSPITAL_BASED_OUTPATIENT_CLINIC_OR_DEPARTMENT_OTHER): Payer: Self-pay

## 2024-09-26 ENCOUNTER — Other Ambulatory Visit: Payer: Self-pay | Admitting: Cardiology

## 2024-09-26 ENCOUNTER — Other Ambulatory Visit: Payer: Self-pay

## 2024-09-26 DIAGNOSIS — I152 Hypertension secondary to endocrine disorders: Secondary | ICD-10-CM

## 2024-09-26 MED ORDER — LIDOCAINE 5 % EX PTCH
1.0000 | MEDICATED_PATCH | Freq: Two times a day (BID) | CUTANEOUS | 1 refills | Status: AC | PRN
  Filled 2024-09-26 (×2): qty 30, 15d supply, fill #0

## 2024-09-26 MED ORDER — CARVEDILOL 12.5 MG PO TABS
12.5000 mg | ORAL_TABLET | Freq: Two times a day (BID) | ORAL | 1 refills | Status: AC
  Filled 2024-09-26: qty 180, 90d supply, fill #0

## 2024-09-26 MED ORDER — EZETIMIBE 10 MG PO TABS
10.0000 mg | ORAL_TABLET | Freq: Every day | ORAL | 0 refills | Status: AC
  Filled 2024-09-26: qty 90, 90d supply, fill #0

## 2024-09-27 ENCOUNTER — Other Ambulatory Visit (HOSPITAL_BASED_OUTPATIENT_CLINIC_OR_DEPARTMENT_OTHER): Payer: Self-pay

## 2024-09-28 ENCOUNTER — Other Ambulatory Visit (HOSPITAL_COMMUNITY): Payer: Self-pay

## 2024-09-28 ENCOUNTER — Telehealth: Payer: Self-pay | Admitting: Nutrition

## 2024-09-28 NOTE — Telephone Encounter (Signed)
 Patient is here today to start his 780G medtronic pump.  He was trained on this by the [employer] representative.  Download of pump showed some low blood sugars during the night, and patient still not bolusing for meals or snacks.  Basal rate decrease by 0.01 units at MN per instructions from Dr. Sam.  He did not have his sensors and will return for this when he gets them from [employer].  They are on back order

## 2024-10-17 ENCOUNTER — Other Ambulatory Visit: Payer: Self-pay

## 2024-10-17 ENCOUNTER — Other Ambulatory Visit (HOSPITAL_BASED_OUTPATIENT_CLINIC_OR_DEPARTMENT_OTHER): Payer: Self-pay

## 2024-11-29 ENCOUNTER — Ambulatory Visit: Admitting: Internal Medicine

## 2024-12-01 ENCOUNTER — Ambulatory Visit: Admitting: Family Medicine
